# Patient Record
Sex: Male | Born: 1970 | Race: White | Hispanic: No | Smoking: Never smoker
Health system: Southern US, Community
[De-identification: ages and names within clinical notes are randomized; demographics above are authoritative.]

## PROBLEM LIST (undated history)

## (undated) DIAGNOSIS — I10 Essential (primary) hypertension: Secondary | ICD-10-CM

## (undated) DIAGNOSIS — E119 Type 2 diabetes mellitus without complications: Secondary | ICD-10-CM

## (undated) DIAGNOSIS — N200 Calculus of kidney: Secondary | ICD-10-CM

## (undated) DIAGNOSIS — E039 Hypothyroidism, unspecified: Secondary | ICD-10-CM

## (undated) DIAGNOSIS — T7840XA Allergy, unspecified, initial encounter: Secondary | ICD-10-CM

## (undated) DIAGNOSIS — G932 Benign intracranial hypertension: Secondary | ICD-10-CM

## (undated) DIAGNOSIS — G510 Bell's palsy: Secondary | ICD-10-CM

## (undated) DIAGNOSIS — I639 Cerebral infarction, unspecified: Secondary | ICD-10-CM

## (undated) DIAGNOSIS — D649 Anemia, unspecified: Secondary | ICD-10-CM

## (undated) HISTORY — PX: NO PAST SURGERIES: SHX2092

## (undated) HISTORY — DX: Essential (primary) hypertension: I10

## (undated) HISTORY — DX: Type 2 diabetes mellitus without complications: E11.9

## (undated) HISTORY — DX: Allergy, unspecified, initial encounter: T78.40XA

## (undated) HISTORY — DX: Hypothyroidism, unspecified: E03.9

## (undated) HISTORY — PX: LUMBAR PUNCTURE: SHX1985

## (undated) HISTORY — DX: Calculus of kidney: N20.0

## (undated) HISTORY — PX: REMOVAL OF A DIALYSIS CATHETER: SHX6053

---

## 2012-07-24 ENCOUNTER — Ambulatory Visit: Payer: Self-pay | Admitting: Internal Medicine

## 2012-08-12 ENCOUNTER — Telehealth: Payer: Self-pay | Admitting: Internal Medicine

## 2012-08-12 NOTE — Telephone Encounter (Signed)
Patient Information:  Caller Name: Gregory Crane  Phone: [phone]  Patient: Gregory Crane  Gender: Male  DOB: 03-20-1971  Age: 41 Years  PCP: [name]  Office Follow Up:  Does the office need to follow up with this patient?: Yes  Instructions For The Office: he triaged out for office today but call was like at 1630  He sounded stable enough for appt for Thursday but I didn't see any openings with Dr Nicki Reaper.  Could you see if he can be worked in to be seen on Thursday    I did instruct him to call back for any worsening of sxs  RN Note:  onset 2 days ago of vertigo,  He has had nausea associated with it and has had one episode of vomiting.  No pain and no breathing diffficulty   blood sugars have been ranging 160/220 which he says is not that much out of range for him.  Symptoms  Reason For Call & Symptoms: vertigo  Reviewed Health History In EMR: Yes  Reviewed Medications In EMR: Yes  Reviewed Allergies In EMR: Yes  Reviewed Surgeries / Procedures: Yes  Date of Onset of Symptoms: 08/10/2012  Guideline(s) Used:  Dizziness  Disposition Per Guideline:   Go to Office Now  Reason For Disposition Reached:   Lightheadedness (dizziness) present now, after 2 hours of rest and fluids  Advice Given:  Call Back If:  Passes out (faints)  You become worse.  RN Overrode Recommendation:  Follow Up With Office Later  needs to see if can be worked in [employer]

## 2012-08-12 NOTE — Telephone Encounter (Signed)
Called patient back, he said that he would go to acute and follow up with [place] afterwards.

## 2012-08-25 ENCOUNTER — Other Ambulatory Visit: Payer: Self-pay | Admitting: Internal Medicine

## 2012-08-25 NOTE — Telephone Encounter (Signed)
Pt is needing refills on 1000 mg Metformin and 75 mg Synthroid he uses [facility] on [address].

## 2012-08-27 MED ORDER — METFORMIN HCL 1000 MG PO TABS: 1000.0000 mg | ORAL_TABLET | Freq: Two times a day (BID) | ORAL | Status: DC

## 2012-08-27 MED ORDER — LEVOTHYROXINE SODIUM 75 MCG PO TABS: 75.0000 ug | ORAL_TABLET | Freq: Every day | ORAL | Status: DC

## 2012-08-27 NOTE — Telephone Encounter (Signed)
Called in to pharmacy. Patient notified.

## 2012-10-16 ENCOUNTER — Encounter: Payer: Self-pay | Admitting: Internal Medicine

## 2012-10-20 ENCOUNTER — Ambulatory Visit: Payer: Self-pay | Admitting: Internal Medicine

## 2012-12-09 NOTE — Telephone Encounter (Signed)
Please close encounter

## 2012-12-21 ENCOUNTER — Ambulatory Visit: Payer: Self-pay | Admitting: Internal Medicine

## 2013-02-04 ENCOUNTER — Encounter: Payer: Self-pay | Admitting: Internal Medicine

## 2013-05-12 ENCOUNTER — Telehealth: Payer: Self-pay | Admitting: *Deleted

## 2013-05-12 ENCOUNTER — Other Ambulatory Visit: Payer: Self-pay | Admitting: *Deleted

## 2013-05-12 MED ORDER — GLUCOSE BLOOD VI STRP: ORAL_STRIP | Status: DC

## 2013-05-12 NOTE — Telephone Encounter (Signed)
Pt called requesting test strips (freestyle lite)-needs sent to [facility] on [address]. I informed pt that he needs to have an appt on the schedule with Dr. Nicki Reaper prior to refilling anything. He has cancelled 3 new patient appts so far. Pt made appt for 11/16 (also advised that he must keep this appt). Notified pt that we will send it enough until his appt as long as Dr. Nicki Reaper agrees.

## 2013-05-12 NOTE — Telephone Encounter (Signed)
Sent Rx in electronically

## 2013-05-12 NOTE — Telephone Encounter (Signed)
rx written and placed in your basket.  Agree - has to keep appt for any more refills.

## 2013-07-01 ENCOUNTER — Ambulatory Visit (INDEPENDENT_AMBULATORY_CARE_PROVIDER_SITE_OTHER): Payer: 59 | Admitting: Internal Medicine

## 2013-07-01 ENCOUNTER — Encounter: Payer: Self-pay | Admitting: Internal Medicine

## 2013-07-01 VITALS — BP 140/90 | HR 93 | Temp 98.3°F | Ht 72.0 in | Wt 273.0 lb

## 2013-07-01 DIAGNOSIS — Z9109 Other allergy status, other than to drugs and biological substances: Secondary | ICD-10-CM

## 2013-07-01 DIAGNOSIS — E119 Type 2 diabetes mellitus without complications: Secondary | ICD-10-CM

## 2013-07-01 MED ORDER — GABAPENTIN 300 MG PO CAPS: 300.0000 mg | ORAL_CAPSULE | Freq: Every day | ORAL | Status: DC

## 2013-07-01 NOTE — Progress Notes (Signed)
Pre-visit discussion using our clinic review tool. No additional management support is needed unless otherwise documented below in the visit note.  

## 2013-07-01 NOTE — Progress Notes (Signed)
Subjective:    Patient ID: Gregory Crane, male    DOB: Jun 15, 1971, 42 y.o.   MRN: [id number]  HPI 42 year old male with past history of diabetes who comes in today to follow up on this as well as to transfer his care over the [place].  Former pt of mine at [facility].  He is not on any medications now for his diabetes.  States he was in a routine of exercising and watching what he eats.  States he lost weight and his sugars were under good control.  His average sugar was 112.  He reports he has now been out of his routine. Not checking his sugars.  Ran out of test strips. No chest pain or tightness.  No sob.  No acid reflux.  Does report hypersensitivity bilateral feet.  Occurs intermittently.  States it is localized to one spot - not his whole foot or toes.  Does keep him awake for a few nights when it occurs.  Described as a sharp/tingling sensation.  Does not radiate up his foot or leg.  Overall he feels he is doing well.     Past Medical History  Diagnosis Date  . Hypothyroidism   . Diabetes mellitus without complication   . Allergy   . Kidney stones      Outpatient Encounter Prescriptions as of 07/01/2013  Medication Sig  . glucose blood (FREESTYLE LITE) test strip Check blood sugars twice a day (Dx. 250.02)  . gabapentin (NEURONTIN) 300 MG capsule Take 1 capsule (300 mg total) by mouth daily.  . [DISCONTINUED] levothyroxine (SYNTHROID, LEVOTHROID) 75 MCG tablet Take 1 tablet (75 mcg total) by mouth daily.  . [DISCONTINUED] metFORMIN (GLUCOPHAGE) 1000 MG tablet Take 1 tablet (1,000 mg total) by mouth 2 (two) times daily.    Review of Systems Patient denies any headache, lightheadedness or dizziness.  No sinus or allergy symptoms.  No chest pain, tightness or palpitations.  No increased shortness of breath, cough or congestion.  No nausea or vomiting.  No abdominal pain or cramping.  No bowel change, such as diarrhea, constipation, BRBPR or melana.  No urine change.   Overall he feels he is  doing well.  Not exercising and not watching his diet. Plans to restart.  Has he foot sensation as outlined.       Objective:   Physical Exam Filed Vitals:   07/01/13 1329  BP: 140/90  Pulse: 93  Temp: 98.3 F (36.8 C)   Blood pressure recheck:  38/1  42 year old male in no acute distress.  HEENT:  Nares - clear.  Oropharynx - without lesions. NECK:  Supple.  Nontender.  No audible carotid bruit.  HEART:  Appears to be regular.   LUNGS:  No crackles or wheezing audible.  Respirations even and unlabored.   RADIAL PULSE:  Equal bilaterally.  ABDOMEN:  Soft.  Nontender.  Bowel sounds present and normal.  No audible abdominal bruit.   EXTREMITIES:  No increased edema present.  DP pulses palpable and equal bilaterally.  NEURO:  No decreased sensation to pin prick - bilateral feet.         Assessment & Plan:  NEURO.  Describes the hypersensitivity in his feet.  Check b12.  Discussed further w/up.  He wants to monitor for now.   HEALTH MAINTENANCE.  Schedule a physical for next visit.  Check psa.    I spent 25 minutes with the patient and more than 50% of the time was spent in  consultation regarding the above.

## 2013-07-04 ENCOUNTER — Encounter: Payer: Self-pay | Admitting: Internal Medicine

## 2013-07-04 DIAGNOSIS — Z9109 Other allergy status, other than to drugs and biological substances: Secondary | ICD-10-CM | POA: Insufficient documentation

## 2013-07-04 DIAGNOSIS — E119 Type 2 diabetes mellitus without complications: Secondary | ICD-10-CM | POA: Insufficient documentation

## 2013-07-04 NOTE — Assessment & Plan Note (Signed)
Has not been checking his sugars recently.  No test strips.  Not watching his diet.  Not exercising.  Plans to restart.  rx given for test strips.

## 2013-07-04 NOTE — Assessment & Plan Note (Signed)
Controlled.  

## 2013-07-21 ENCOUNTER — Other Ambulatory Visit: Payer: Self-pay | Admitting: *Deleted

## 2013-07-21 MED ORDER — GLUCOSE BLOOD VI STRP: ORAL_STRIP | Status: DC

## 2013-09-30 ENCOUNTER — Other Ambulatory Visit: Payer: Self-pay | Admitting: Internal Medicine

## 2013-10-01 LAB — CBC WITH DIFFERENTIAL
Basophils Absolute: 0 10*3/uL (ref 0.0–0.2)
Basos: 0 %
Eos: 1 %
Eosinophils Absolute: 0.1 10*3/uL (ref 0.0–0.4)
HCT: 48.4 % (ref 37.5–51.0)
Hemoglobin: 17 g/dL (ref 12.6–17.7)
Immature Grans (Abs): 0 10*3/uL (ref 0.0–0.1)
Immature Granulocytes: 0 %
Lymphocytes Absolute: 1.8 10*3/uL (ref 0.7–3.1)
Lymphs: 27 %
MCH: 30.7 pg (ref 26.6–33.0)
MCHC: 35.1 g/dL (ref 31.5–35.7)
MCV: 87 fL (ref 79–97)
Monocytes Absolute: 0.4 10*3/uL (ref 0.1–0.9)
Monocytes: 6 %
Neutrophils Absolute: 4.1 10*3/uL (ref 1.4–7.0)
Neutrophils Relative %: 66 %
Platelets: 259 10*3/uL (ref 150–379)
RBC: 5.54 x10E6/uL (ref 4.14–5.80)
RDW: 12.7 % (ref 12.3–15.4)
WBC: 6.4 10*3/uL (ref 3.4–10.8)

## 2013-10-01 LAB — COMPREHENSIVE METABOLIC PANEL
ALT: 18 IU/L (ref 0–44)
AST: 18 IU/L (ref 0–40)
Albumin/Globulin Ratio: 1.5 (ref 1.1–2.5)
Albumin: 4.6 g/dL (ref 3.5–5.5)
Alkaline Phosphatase: 85 IU/L (ref 39–117)
BUN/Creatinine Ratio: 16 (ref 9–20)
BUN: 17 mg/dL (ref 6–24)
CO2: 21 mmol/L (ref 18–29)
Calcium: 9.9 mg/dL (ref 8.7–10.2)
Chloride: 97 mmol/L (ref 97–108)
Creatinine, Ser: 1.08 mg/dL (ref 0.76–1.27)
GFR calc Af Amer: 97 mL/min/{1.73_m2} (ref >59)
GFR calc non Af Amer: 84 mL/min/{1.73_m2} (ref >59)
Globulin, Total: 3 g/dL (ref 1.5–4.5)
Glucose: 214 mg/dL — ABNORMAL HIGH (ref 65–99)
Potassium: 5.2 mmol/L (ref 3.5–5.2)
Sodium: 137 mmol/L (ref 134–144)
Total Bilirubin: 0.4 mg/dL (ref 0.0–1.2)
Total Protein: 7.6 g/dL (ref 6.0–8.5)

## 2013-10-01 LAB — MICROALBUMIN / CREATININE URINE RATIO
Creatinine, Ur: 194.7 mg/dL (ref 22.0–328.0)
MICROALB/CREAT RATIO: 67.4 mg/g creat — ABNORMAL HIGH (ref 0.0–30.0)
Microalbumin, Urine: 131.3 ug/mL — ABNORMAL HIGH (ref 0.0–17.0)

## 2013-10-01 LAB — LIPID PANEL W/O CHOL/HDL RATIO
Cholesterol, Total: 173 mg/dL (ref 100–199)
HDL: 25 mg/dL — ABNORMAL LOW (ref >39)
LDL Calculated: 99 mg/dL (ref 0–99)
Triglycerides: 247 mg/dL — ABNORMAL HIGH (ref 0–149)
VLDL Cholesterol Cal: 49 mg/dL — ABNORMAL HIGH (ref 5–40)

## 2013-10-01 LAB — TSH: TSH: 15.45 u[IU]/mL — ABNORMAL HIGH (ref 0.450–4.500)

## 2013-10-01 LAB — HGB A1C W/O EAG: Hgb A1c MFr Bld: 11.4 % — ABNORMAL HIGH (ref 4.8–5.6)

## 2013-10-01 LAB — VITAMIN B12: Vitamin B-12: 156 pg/mL — ABNORMAL LOW (ref 211–946)

## 2013-10-05 ENCOUNTER — Ambulatory Visit (INDEPENDENT_AMBULATORY_CARE_PROVIDER_SITE_OTHER): Payer: 59 | Admitting: Internal Medicine

## 2013-10-05 ENCOUNTER — Encounter: Payer: Self-pay | Admitting: Internal Medicine

## 2013-10-05 ENCOUNTER — Encounter (INDEPENDENT_AMBULATORY_CARE_PROVIDER_SITE_OTHER): Payer: Self-pay

## 2013-10-05 VITALS — BP 120/82 | HR 83 | Temp 97.9°F | Ht 72.5 in | Wt 268.5 lb

## 2013-10-05 DIAGNOSIS — E039 Hypothyroidism, unspecified: Secondary | ICD-10-CM

## 2013-10-05 DIAGNOSIS — E785 Hyperlipidemia, unspecified: Secondary | ICD-10-CM

## 2013-10-05 DIAGNOSIS — Z9109 Other allergy status, other than to drugs and biological substances: Secondary | ICD-10-CM

## 2013-10-05 DIAGNOSIS — R809 Proteinuria, unspecified: Secondary | ICD-10-CM

## 2013-10-05 DIAGNOSIS — E538 Deficiency of other specified B group vitamins: Secondary | ICD-10-CM

## 2013-10-05 DIAGNOSIS — R195 Other fecal abnormalities: Secondary | ICD-10-CM

## 2013-10-05 DIAGNOSIS — E119 Type 2 diabetes mellitus without complications: Secondary | ICD-10-CM

## 2013-10-05 MED ORDER — LEVOTHYROXINE SODIUM 50 MCG PO TABS: 50.0000 ug | ORAL_TABLET | Freq: Every day | ORAL | Status: DC

## 2013-10-05 MED ORDER — METFORMIN HCL 1000 MG PO TABS: 1000.0000 mg | ORAL_TABLET | Freq: Two times a day (BID) | ORAL | Status: DC

## 2013-10-05 MED ORDER — CYANOCOBALAMIN 1000 MCG/ML IJ SOLN
1000.0000 ug | Freq: Once | INTRAMUSCULAR | Status: AC
  Administered 2013-10-05: 1000 ug via INTRAMUSCULAR

## 2013-10-05 NOTE — Progress Notes (Signed)
Pre-visit discussion using our clinic review tool. No additional management support is needed unless otherwise documented below in the visit note.  

## 2013-10-10 ENCOUNTER — Encounter: Payer: Self-pay | Admitting: Internal Medicine

## 2013-10-10 DIAGNOSIS — E039 Hypothyroidism, unspecified: Secondary | ICD-10-CM | POA: Insufficient documentation

## 2013-10-10 DIAGNOSIS — E785 Hyperlipidemia, unspecified: Secondary | ICD-10-CM | POA: Insufficient documentation

## 2013-10-10 DIAGNOSIS — E538 Deficiency of other specified B group vitamins: Secondary | ICD-10-CM | POA: Insufficient documentation

## 2013-10-10 DIAGNOSIS — R809 Proteinuria, unspecified: Secondary | ICD-10-CM | POA: Insufficient documentation

## 2013-10-10 DIAGNOSIS — R195 Other fecal abnormalities: Secondary | ICD-10-CM | POA: Insufficient documentation

## 2013-10-10 NOTE — Assessment & Plan Note (Signed)
TSH elevated - 15.45.  Start synthroid 52mcg q day.  Check TSH in 4-6 weeks.

## 2013-10-10 NOTE — Assessment & Plan Note (Signed)
Triglycerides elevated - 247.  Low fat/low carb diet.  Will follow.  Diet and exercise.

## 2013-10-10 NOTE — Assessment & Plan Note (Signed)
Found on recent labs.  Will need to start ACE inhibitor.  Will follow.

## 2013-10-10 NOTE — Assessment & Plan Note (Signed)
Low B12.  Start 1060mcg q wk x 3 and then q month.

## 2013-10-10 NOTE — Assessment & Plan Note (Signed)
Heme positive on exam today.  Refer to GI for evaluation.  Hgb just checked 10/10/13 - 17.0.

## 2013-10-10 NOTE — Progress Notes (Signed)
  Subjective:    Patient ID: Gregory Crane, male    DOB: 1971/04/04, 43 y.o.   MRN: [id number]  HPI 43 year old male with past history of diabetes who comes in today to follow up on this as well as for a complete physical exam.  He is not on any medications now for his diabetes.  Has stopped all medications.  Had not been checking his sugars.  Just recently started.  AM sugars now 170.  He has just started back exercising and watching his diet.  previously had not been doing this.  A1c just checked - 11.4.    No chest pain or tightness.  No sob.  No acid reflux.   Overall he feels he is doing well.     Past Medical History  Diagnosis Date  . Hypothyroidism   . Diabetes mellitus without complication   . Allergy   . Kidney stones      Outpatient Encounter Prescriptions as of 10/05/2013  Medication Sig  . glucose blood (FREESTYLE LITE) test strip Check blood sugars twice a day (Dx. 250.02)  . levothyroxine (SYNTHROID, LEVOTHROID) 50 MCG tablet Take 1 tablet (50 mcg total) by mouth daily.  . metFORMIN (GLUCOPHAGE) 1000 MG tablet Take 1 tablet (1,000 mg total) by mouth 2 (two) times daily with a meal.  . [DISCONTINUED] gabapentin (NEURONTIN) 300 MG capsule Take 1 capsule (300 mg total) by mouth daily.  . [EXPIRED] cyanocobalamin ((VITAMIN B-12)) injection 1,000 mcg     Review of Systems Patient denies any headache, lightheadedness or dizziness.  No sinus or allergy symptoms.  No chest pain, tightness or palpitations.  No increased shortness of breath, cough or congestion.  No nausea or vomiting.  No abdominal pain or cramping.  No bowel change, such as diarrhea, constipation.   No urine change.   Overall he feels he is doing well.  Just started exercising and watching his diet.        Objective:   Physical Exam  Filed Vitals:   10/05/13 0940  BP: 120/82  Pulse: 83  Temp: 97.9 F (33.28 C)   43 year old male in no acute distress.  HEENT:  Nares - clear.  Oropharynx - without  lesions. NECK:  Supple.  Nontender.  No audible carotid bruit.  HEART:  Appears to be regular.   LUNGS:  No crackles or wheezing audible.  Respirations even and unlabored.   RADIAL PULSE:  Equal bilaterally.  ABDOMEN:  Soft.  Nontender.  Bowel sounds present and normal.  No audible abdominal bruit.  GU:  Normal descended testicles.  No palpable testicular nodules.   RECTAL:  Could not appreciate any palpable prostate nodules.  Heme positive.   EXTREMITIES:  No increased edema present.  DP pulses palpable and equal bilaterally.  FEET:  No lesions.         Assessment & Plan:  HEALTH MAINTENANCE:  Physical today.  Check psa next labs.  Refer to GI for evaluation of heme positive stool.     I spent 25 minutes with the patient and more than 50% of the time was spent in consultation regarding the above.

## 2013-10-10 NOTE — Assessment & Plan Note (Addendum)
Sugars poorly controlled.  A1c just checked 11.4.  Has just started back exercising and watching his diet.  AM sugars now averaging 170 - just recently.  Discussed the importance of checking sugars regularly and watching his diet and exercising. Is being followed at [facility] for his eye exams.  Sees them q 6 months.  Since he is adjusting his diet and now exercising, will start metformin 1000 bid.  Hold on additional medication.  Follow.

## 2013-10-10 NOTE — Assessment & Plan Note (Signed)
Controlled.  

## 2013-10-12 ENCOUNTER — Ambulatory Visit: Payer: 59

## 2013-10-14 ENCOUNTER — Ambulatory Visit (INDEPENDENT_AMBULATORY_CARE_PROVIDER_SITE_OTHER): Payer: 59 | Admitting: *Deleted

## 2013-10-14 DIAGNOSIS — E538 Deficiency of other specified B group vitamins: Secondary | ICD-10-CM

## 2013-10-14 MED ORDER — CYANOCOBALAMIN 1000 MCG/ML IJ SOLN
1000.0000 ug | Freq: Once | INTRAMUSCULAR | Status: AC
  Administered 2013-10-14: 1000 ug via INTRAMUSCULAR

## 2013-10-19 ENCOUNTER — Ambulatory Visit: Payer: 59

## 2013-10-20 ENCOUNTER — Ambulatory Visit (INDEPENDENT_AMBULATORY_CARE_PROVIDER_SITE_OTHER): Payer: 59 | Admitting: *Deleted

## 2013-10-20 DIAGNOSIS — E538 Deficiency of other specified B group vitamins: Secondary | ICD-10-CM

## 2013-10-20 MED ORDER — CYANOCOBALAMIN 1000 MCG/ML IJ SOLN
1000.0000 ug | Freq: Once | INTRAMUSCULAR | Status: AC
  Administered 2013-10-20: 1000 ug via INTRAMUSCULAR

## 2013-10-29 ENCOUNTER — Ambulatory Visit (INDEPENDENT_AMBULATORY_CARE_PROVIDER_SITE_OTHER): Payer: 59 | Admitting: Internal Medicine

## 2013-10-29 ENCOUNTER — Encounter: Payer: Self-pay | Admitting: Internal Medicine

## 2013-10-29 VITALS — BP 120/82 | HR 66 | Temp 97.7°F | Ht 72.5 in | Wt 270.5 lb

## 2013-10-29 DIAGNOSIS — E538 Deficiency of other specified B group vitamins: Secondary | ICD-10-CM

## 2013-10-29 DIAGNOSIS — R809 Proteinuria, unspecified: Secondary | ICD-10-CM

## 2013-10-29 DIAGNOSIS — E785 Hyperlipidemia, unspecified: Secondary | ICD-10-CM

## 2013-10-29 DIAGNOSIS — E119 Type 2 diabetes mellitus without complications: Secondary | ICD-10-CM

## 2013-10-29 MED ORDER — LISINOPRIL 10 MG PO TABS: 10.0000 mg | ORAL_TABLET | Freq: Every day | ORAL | Status: DC

## 2013-10-29 MED ORDER — METFORMIN HCL 1000 MG PO TABS: 1000.0000 mg | ORAL_TABLET | Freq: Two times a day (BID) | ORAL | Status: DC

## 2013-10-29 NOTE — Progress Notes (Signed)
Pre-visit discussion using our clinic review tool. No additional management support is needed unless otherwise documented below in the visit note.  

## 2013-10-31 ENCOUNTER — Encounter: Payer: Self-pay | Admitting: Internal Medicine

## 2013-10-31 NOTE — Assessment & Plan Note (Signed)
Sugars had been poorly controlled.  A1c just checked 11.4.  Has just started back exercising and watching his diet.  Sugars attached.   Much improved.  Discussed the importance of checking sugars regularly and watching his diet and exercising. Is being followed at [facility] for his eye exams.  Sees them q 6 months.  On metformin 1000 bid.  Start an ACE inhibitor for renal protection.

## 2013-10-31 NOTE — Progress Notes (Signed)
  Subjective:    Patient ID: Gregory Crane, male    DOB: July 10, 1971, 43 y.o.   MRN: [id number]  HPI 43 year old male with past history of diabetes who comes in today for a scheduled follow up.  His last a1c - 11.4.   He has adjusted his diet.  Is trying to exercise.  Taking metformin now.  Sugars improved.   Sugars attached.  No chest pain or tightness.  No sob.  No acid reflux.   Overall he feels he is doing well.  Feels better.     Past Medical History  Diagnosis Date  . Hypothyroidism   . Diabetes mellitus without complication   . Allergy   . Kidney stones      Outpatient Encounter Prescriptions as of 10/29/2013  Medication Sig  . glucose blood (FREESTYLE LITE) test strip Check blood sugars twice a day (Dx. 250.02)  . levothyroxine (SYNTHROID, LEVOTHROID) 50 MCG tablet Take 1 tablet (50 mcg total) by mouth daily.  . metFORMIN (GLUCOPHAGE) 1000 MG tablet Take 1 tablet (1,000 mg total) by mouth 2 (two) times daily with a meal.  . [DISCONTINUED] metFORMIN (GLUCOPHAGE) 1000 MG tablet Take 1 tablet (1,000 mg total) by mouth 2 (two) times daily with a meal.  . lisinopril (PRINIVIL,ZESTRIL) 10 MG tablet Take 1 tablet (10 mg total) by mouth daily.    Review of Systems Patient denies any headache, lightheadedness or dizziness.  No sinus or allergy symptoms.  No chest pain, tightness or palpitations.  No increased shortness of breath, cough or congestion.  No nausea or vomiting.  No abdominal pain or cramping.  No bowel change, such as diarrhea, constipation.   No urine change.   Overall he feels he is doing well.  Just started exercising and watching his diet.  Sugars improved.        Objective:   Physical Exam  Filed Vitals:   10/29/13 1004  BP: 120/82  Pulse: 66  Temp: 97.7 F (36.5 C)   Blood pressure recheck:  138-105/71-23  43 year old male in no acute distress.  HEENT:  Nares - clear.  Oropharynx - without lesions. NECK:  Supple.  Nontender.  No audible carotid bruit.  HEART:   Appears to be regular.   LUNGS:  No crackles or wheezing audible.  Respirations even and unlabored.   RADIAL PULSE:  Equal bilaterally.  ABDOMEN:  Soft.  Nontender.  Bowel sounds present and normal.  No audible abdominal bruit.   EXTREMITIES:  No increased edema present.  DP pulses palpable and equal bilaterally.  FEET:  No lesions.         Assessment & Plan:  HEALTH MAINTENANCE:  Physical 10/05/13.  Check psa next labs.  Referred to GI for evaluation of heme positive stool.     I spent 25 minutes with the patient and more than 50% of the time was spent in consultation regarding the above.

## 2013-10-31 NOTE — Assessment & Plan Note (Signed)
Receiving B12 injections.  °

## 2013-10-31 NOTE — Assessment & Plan Note (Signed)
Discussed with him today.  Start lisinopril $RemoveBeforeDE'10mg'[id number]$  q day.  Check met b 2 weeks after starting Lisinopril.

## 2013-10-31 NOTE — Assessment & Plan Note (Signed)
Triglycerides elevated - 247.  Low fat/low carb diet.  Will follow.  Diet and exercise.

## 2014-01-12 ENCOUNTER — Ambulatory Visit: Payer: 59 | Admitting: Internal Medicine

## 2014-01-26 ENCOUNTER — Emergency Department: Payer: Self-pay | Admitting: Emergency Medicine

## 2014-01-26 LAB — TROPONIN I: Troponin-I: <0.02 ng/mL

## 2014-01-26 LAB — CBC WITH DIFFERENTIAL/PLATELET
Basophil #: 0 10*3/uL (ref 0.0–0.1)
Basophil %: 0.3 %
Eosinophil #: 0.1 10*3/uL (ref 0.0–0.7)
Eosinophil %: 0.8 %
HCT: 49.6 % (ref 40.0–52.0)
HGB: 16.7 g/dL (ref 13.0–18.0)
Lymphocyte #: 2.1 10*3/uL (ref 1.0–3.6)
Lymphocyte %: 25.4 %
MCH: 30 pg (ref 26.0–34.0)
MCHC: 33.7 g/dL (ref 32.0–36.0)
MCV: 89 fL (ref 80–100)
Monocyte #: 0.5 x10 3/mm (ref 0.2–1.0)
Monocyte %: 5.9 %
Neutrophil #: 5.5 10*3/uL (ref 1.4–6.5)
Neutrophil %: 67.6 %
Platelet: 233 10*3/uL (ref 150–440)
RBC: 5.57 10*6/uL (ref 4.40–5.90)
RDW: 12.3 % (ref 11.5–14.5)
WBC: 8.2 10*3/uL (ref 3.8–10.6)

## 2014-01-26 LAB — BASIC METABOLIC PANEL
Anion Gap: 7 (ref 7–16)
BUN: 9 mg/dL (ref 7–18)
Calcium, Total: 9 mg/dL (ref 8.5–10.1)
Chloride: 102 mmol/L (ref 98–107)
Co2: 27 mmol/L (ref 21–32)
Creatinine: 0.76 mg/dL (ref 0.60–1.30)
EGFR (African American): >60
EGFR (Non-African Amer.): >60
Glucose: 217 mg/dL — ABNORMAL HIGH (ref 65–99)
Osmolality: 277 (ref 275–301)
Potassium: 4 mmol/L (ref 3.5–5.1)
Sodium: 136 mmol/L (ref 136–145)

## 2014-01-27 LAB — URINALYSIS, COMPLETE
Bacteria: NONE SEEN
Bilirubin,UR: NEGATIVE
Blood: NEGATIVE
Glucose,UR: >=500 mg/dL (ref 0–75)
Leukocyte Esterase: NEGATIVE
Nitrite: NEGATIVE
Ph: 5 (ref 4.5–8.0)
Protein: 30
RBC,UR: NONE SEEN /HPF (ref 0–5)
Specific Gravity: 1.033 (ref 1.003–1.030)
Squamous Epithelial: NONE SEEN
WBC UR: <1 /HPF (ref 0–5)

## 2014-01-28 ENCOUNTER — Ambulatory Visit (INDEPENDENT_AMBULATORY_CARE_PROVIDER_SITE_OTHER): Payer: 59 | Admitting: Adult Health

## 2014-01-28 ENCOUNTER — Telehealth: Payer: Self-pay | Admitting: Internal Medicine

## 2014-01-28 ENCOUNTER — Encounter: Payer: Self-pay | Admitting: Adult Health

## 2014-01-28 VITALS — BP 129/83 | HR 76 | Temp 97.6°F | Resp 14 | Wt 266.8 lb

## 2014-01-28 DIAGNOSIS — R42 Dizziness and giddiness: Secondary | ICD-10-CM

## 2014-01-28 MED ORDER — DIAZEPAM 5 MG PO TABS: ORAL_TABLET | ORAL | Status: DC

## 2014-01-28 MED ORDER — PROMETHAZINE HCL 25 MG PO TABS: 25.0000 mg | ORAL_TABLET | Freq: Three times a day (TID) | ORAL | Status: DC | PRN

## 2014-01-28 NOTE — Telephone Encounter (Signed)
Patient Information:  Caller Name: Kazi  Phone: [phone]  Patient: Gregory Crane, Gregory Crane  Gender: Male  DOB: Nov 28, 1970  Age: 43 Years  PCP: [name]  Office Follow Up:  Does the office need to follow up with this patient?: No  Instructions For The Office: N/A  RN Note:  Transferred pt. to the office for approval to see there. Will see Raquel Rey at 10:15 this morning for evaluation.  Symptoms  Reason For Call & Symptoms: Martin Majestic to the ED on 01/26/14 with vertigo. Had CT scan and bloodwork. All tests negative. Rx: Antivert. Now very nauseated and Antivert not helping his dizziness much. States passed one blood clot from the rectum 2 weeks ago.  Reviewed Health History In EMR: Yes  Reviewed Medications In EMR: Yes  Reviewed Allergies In EMR: Yes  Reviewed Surgeries / Procedures: Yes  Date of Onset of Symptoms: 01/26/2014  Treatments Tried: Antivert  Treatments Tried Worked: No  Guideline(s) Used:  Dizziness  Disposition Per Guideline:   Go to ED Now (or to Office with PCP Approval)  Reason For Disposition Reached:   Severe dizziness (e.g., unable to stand, requires support to walk, feels like passing out now)  Advice Given:  Call Back If:  Passes out (faints)  You become worse.  Patient Will Follow Care Advice:  YES  Appointment Scheduled:  01/28/2014 10:15:00 Appointment Scheduled Provider:  Charolette Forward

## 2014-01-28 NOTE — Patient Instructions (Signed)
  Do the exercises/maneuvres we discussed in clinic today.  Valium 1 tablet every 12 hours as needed for dizziness  Phenergan 1 tablet every 8 hours as needed for nausea. May cause sedation.  If no improvement by Monday please call.

## 2014-01-28 NOTE — Progress Notes (Signed)
Pre visit review using our clinic review tool, if applicable. No additional management support is needed unless otherwise documented below in the visit note. 

## 2014-01-28 NOTE — Telephone Encounter (Signed)
Appt today with Gregory Crane

## 2014-01-30 ENCOUNTER — Encounter: Payer: Self-pay | Admitting: Adult Health

## 2014-01-30 NOTE — Progress Notes (Signed)
   Subjective:    Patient ID: Gregory Crane, male    DOB: [birth year], 43 y.o.   MRN: [id number]  Dizziness Associated symptoms include nausea. Pertinent negatives include no chest pain, congestion or weakness.    Pt is a pleasant 43 y/o male who presents to clinic s/p visit to ED on 01/26/14 for dizziness. Worse when lying down, turning in bed. He was prescribed meclizine with some relief of symptoms. Experiencing nausea with the dizziness. Symptoms occurred suddenly. He denies chest pain, syncope or near syncope. No recent illness reported. No fever or chills.  Past Medical History  Diagnosis Date  . Hypothyroidism   . Diabetes mellitus without complication   . Allergy   . Kidney stones      Review of Systems  Constitutional: Negative.   HENT: Negative for congestion, ear pain, postnasal drip, rhinorrhea and sinus pressure.   Respiratory: Negative for chest tightness and shortness of breath.   Cardiovascular: Negative for chest pain and palpitations.  Gastrointestinal: Positive for nausea.  Neurological: Positive for dizziness. Negative for tremors, syncope and weakness.  Psychiatric/Behavioral: Negative.   All other systems reviewed and are negative.      Objective:   Physical Exam  Constitutional: He is oriented to person, place, and time. No distress.  HENT:  Head: Normocephalic and atraumatic.  Right Ear: External ear normal.  Left Ear: External ear normal.  Eyes: Conjunctivae and EOM are normal.  Cardiovascular: Normal rate and regular rhythm.   Pulmonary/Chest: Effort normal. No respiratory distress.  Musculoskeletal: Normal range of motion.  Lymphadenopathy:    He has no cervical adenopathy.  Neurological: He is alert and oriented to person, place, and time.  Skin: Skin is warm and dry.  Psychiatric: He has a normal mood and affect. His behavior is normal. Judgment and thought content normal.       Assessment & Plan:   1. Vertigo Suspect BPPV. Discussed  maneuvers and exercises that he can do to see if this will alleviate his symptoms. I will give a prescription for phenergan to help with nausea. Advised that sedation can occur. Will also give short course of valium to see if this help with symptoms as well. If no improvement within 1 week will send to physical therapy.

## 2014-03-24 ENCOUNTER — Telehealth: Payer: Self-pay | Admitting: *Deleted

## 2014-03-24 NOTE — Telephone Encounter (Signed)
Chart reviewed for DM bundle. Pt due for followup with Dr. Nicki Reaper. Left message, notifying pt and to call office to schedule.

## 2014-10-11 LAB — HM DIABETES EYE EXAM

## 2014-10-12 ENCOUNTER — Encounter: Payer: Self-pay | Admitting: Internal Medicine

## 2015-06-29 ENCOUNTER — Encounter: Payer: Self-pay | Admitting: Internal Medicine

## 2015-08-03 ENCOUNTER — Encounter: Payer: Self-pay | Admitting: Internal Medicine

## 2015-08-03 ENCOUNTER — Ambulatory Visit (INDEPENDENT_AMBULATORY_CARE_PROVIDER_SITE_OTHER): Payer: 59 | Admitting: Internal Medicine

## 2015-08-03 VITALS — BP 140/98 | HR 83 | Temp 97.7°F | Resp 18 | Ht 71.25 in | Wt 250.5 lb

## 2015-08-03 DIAGNOSIS — E039 Hypothyroidism, unspecified: Secondary | ICD-10-CM

## 2015-08-03 DIAGNOSIS — I10 Essential (primary) hypertension: Secondary | ICD-10-CM

## 2015-08-03 DIAGNOSIS — E538 Deficiency of other specified B group vitamins: Secondary | ICD-10-CM

## 2015-08-03 DIAGNOSIS — E11319 Type 2 diabetes mellitus with unspecified diabetic retinopathy without macular edema: Secondary | ICD-10-CM | POA: Diagnosis not present

## 2015-08-03 DIAGNOSIS — E785 Hyperlipidemia, unspecified: Secondary | ICD-10-CM

## 2015-08-03 DIAGNOSIS — Z0000 Encounter for general adult medical examination without abnormal findings: Secondary | ICD-10-CM

## 2015-08-03 DIAGNOSIS — R809 Proteinuria, unspecified: Secondary | ICD-10-CM

## 2015-08-03 DIAGNOSIS — G629 Polyneuropathy, unspecified: Secondary | ICD-10-CM

## 2015-08-03 MED ORDER — LISINOPRIL 10 MG PO TABS: 10.0000 mg | ORAL_TABLET | Freq: Every day | ORAL | Status: DC

## 2015-08-03 MED ORDER — GABAPENTIN 100 MG PO CAPS: 100.0000 mg | ORAL_CAPSULE | Freq: Three times a day (TID) | ORAL | Status: DC

## 2015-08-03 NOTE — Progress Notes (Signed)
Patient ID: Gregory Crane, male   DOB: 04/14/1971, 44 y.o.   MRN: [id number]   Subjective:    Patient ID: Gregory Crane, male    DOB: 06/11/1971, 44 y.o.   MRN: [id number]  HPI  Patient with past history of diabetes, allergies, hypothyroidism and hypercholesterolemia.  He comes in today to follow up on these issues as well as for a complete physical exam.  I have not seen him in two years.  He is not taking any of his medications.  Brought in no recorded sugar readings.  States that his sugars are varying.  Highest reading 300.  Lowest reading 110.  Averaging 180.  Discussed diet and exercise.  He plans to start exercising more.  No chest pain or tightness.  No sob.  No abdominal pain or cramping.  Bowels stable.  He does report increased pain in his feet/toes.  Also report nunbness and tingling.  Needs something to help control the discomfort.  He has lost some weight.  Declines lifestyles referral.  States he knows what he is supposed to do.     Past Medical History  Diagnosis Date  . Hypothyroidism   . Diabetes mellitus without complication (HCC)   . Allergy   . Kidney stones    Past Surgical History  Procedure Laterality Date  . No past surgeries     Family History  Problem Relation Age of Onset  . Breast cancer Mother   . Diabetes Father   . Diabetes Sister   . Arthritis Maternal Grandmother   . Diabetes Maternal Grandmother    Social History   Social History  . Marital Status: Married    Spouse Name: N/A  . Number of Children: N/A  . Years of Education: N/A   Social History Main Topics  . Smoking status: Never Smoker   . Smokeless tobacco: Never Used  . Alcohol Use: 0.0 oz/week    0 Standard drinks or equivalent per week     Comment: very rarely (1-2/year)  . Drug Use: No  . Sexual Activity: Not Asked   Other Topics Concern  . None   Social History Narrative    Outpatient Encounter Prescriptions as of 08/03/2015  Medication Sig  . glucose blood (FREESTYLE  LITE) test strip Check blood sugars twice a day (Dx. 250.02)  . diazepam (VALIUM) 5 MG tablet Take 1 tablet every 12 hours as needed for vertigo (Patient not taking: Reported on 08/03/2015)  . gabapentin (NEURONTIN) 100 MG capsule Take 1 capsule (100 mg total) by mouth 3 (three) times daily.  . levothyroxine (SYNTHROID, LEVOTHROID) 50 MCG tablet Take 1 tablet (50 mcg total) by mouth daily. (Patient not taking: Reported on 08/03/2015)  . lisinopril (PRINIVIL,ZESTRIL) 10 MG tablet Take 1 tablet (10 mg total) by mouth daily.  . metFORMIN (GLUCOPHAGE) 1000 MG tablet Take 1 tablet (1,000 mg total) by mouth 2 (two) times daily with a meal. (Patient not taking: Reported on 08/03/2015)  . promethazine (PHENERGAN) 25 MG tablet Take 1 tablet (25 mg total) by mouth every 8 (eight) hours as needed for nausea or vomiting. (Patient not taking: Reported on 08/03/2015)  . [DISCONTINUED] lisinopril (PRINIVIL,ZESTRIL) 10 MG tablet Take 1 tablet (10 mg total) by mouth daily. (Patient not taking: Reported on 08/03/2015)   No facility-administered encounter medications on file as of 08/03/2015.    Review of Systems  Constitutional:       Discussed eating regular meals.  Has adjusted diet some.  Has lost some   weight.    HENT: Negative for congestion and sinus pressure.   Eyes: Negative for pain and discharge.  Respiratory: Negative for cough, chest tightness and shortness of breath.   Cardiovascular: Negative for chest pain, palpitations and leg swelling.  Gastrointestinal: Negative for nausea, vomiting, abdominal pain and diarrhea.  Genitourinary: Negative for dysuria and difficulty urinating.  Musculoskeletal: Negative for back pain and joint swelling.  Skin: Negative for color change and rash.  Neurological: Negative for dizziness, light-headedness and headaches.  Hematological: Negative for adenopathy. Does not bruise/bleed easily.  Psychiatric/Behavioral: Negative for dysphoric mood and agitation.         Objective:    Physical Exam  Constitutional: He is oriented to person, place, and time. He appears well-developed and well-nourished. No distress.  HENT:  Head: Normocephalic and atraumatic.  Nose: Nose normal.  Mouth/Throat: Oropharynx is clear and moist. No oropharyngeal exudate.  Eyes: Conjunctivae are normal. Right eye exhibits no discharge. Left eye exhibits no discharge.  Neck: Neck supple. No thyromegaly present.  Cardiovascular: Normal rate and regular rhythm.   Pulmonary/Chest: Breath sounds normal. No respiratory distress. He has no wheezes.  Abdominal: Soft. Bowel sounds are normal. There is no tenderness.  Genitourinary:  Pt declined.   Musculoskeletal: He exhibits no edema or tenderness.  Lymphadenopathy:    He has no cervical adenopathy.  Neurological: He is alert and oriented to person, place, and time.  Sensation intact to pin prick.  Describes more hypersensitive.    Skin: Skin is warm and dry. No rash noted. No erythema.  Psychiatric: He has a normal mood and affect. His behavior is normal.    BP 140/98 mmHg  Pulse 83  Temp(Src) 97.7 F (36.5 C) (Oral)  Resp 18  Ht 5' 11.25" (1.81 m)  Wt 250 lb 8 oz (113.626 kg)  BMI 34.68 kg/m2  SpO2 96% Wt Readings from Last 3 Encounters:  08/03/15 250 lb 8 oz (113.626 kg)  01/28/14 266 lb 12 oz (120.997 kg)  10/29/13 270 lb 8 oz (122.698 kg)     Lab Results  Component Value Date   WBC 8.2 01/26/2014   HGB 16.7 01/26/2014   HCT 49.6 01/26/2014   PLT 233 01/26/2014   GLUCOSE 217* 01/26/2014   CHOL 173 09/30/2013   TRIG 247* 09/30/2013   HDL 25* 09/30/2013   LDLCALC 99 09/30/2013   ALT 18 09/30/2013   AST 18 09/30/2013   NA 136 01/26/2014   K 4.0 01/26/2014   CL 102 01/26/2014   CREATININE 0.76 01/26/2014   BUN 9 01/26/2014   CO2 27 01/26/2014   TSH 15.450* 09/30/2013   HGBA1C 11.4* 09/30/2013       Assessment & Plan:   Problem List Items Addressed This Visit    B12 deficiency - Primary    Was  receiving B12 injections.  Need to confirm if still receiving.        Diabetes (HCC)    Not taking any medication.  Has lost some weight.  Discussed diet and exercise.  Follow met b and a1c.  Will need to start checking and recording his sugars.  He declines to restart metformin today.  Followed by retina specialists for his eye exams.  Restart lisinopril.        Relevant Medications   lisinopril (PRINIVIL,ZESTRIL) 10 MG tablet   Essential hypertension    Elevated blood pressure.  Restart lisinopril 10mg q day.  Follow.        Relevant Medications   lisinopril (  PRINIVIL,ZESTRIL) 10 MG tablet   Health care maintenance    Physical today 08/03/15.  Declined prostate check.  Check cholesterol.        Hyperlipidemia    Low cholesterol diet and exercise.  Follow lipid panel.        Relevant Medications   lisinopril (PRINIVIL,ZESTRIL) 10 MG tablet   Hypothyroidism    Not taking thyroid replacement.  Recheck tsh to see if needs to restart.        Microalbuminuria    Restart lisinopril.        Neuropathy ([place])    Exam as outlined.  Hypersensitivity.  Start gabapentin 112m tid.  Follow.  Titrate up as needed.            SEinar Pheasant MD

## 2015-08-03 NOTE — Progress Notes (Signed)
Pre-visit discussion using our clinic review tool. No additional management support is needed unless otherwise documented below in the visit note.  

## 2015-08-06 ENCOUNTER — Encounter: Payer: Self-pay | Admitting: Internal Medicine

## 2015-08-06 DIAGNOSIS — G629 Polyneuropathy, unspecified: Secondary | ICD-10-CM | POA: Insufficient documentation

## 2015-08-06 DIAGNOSIS — Z0000 Encounter for general adult medical examination without abnormal findings: Secondary | ICD-10-CM | POA: Insufficient documentation

## 2015-08-06 DIAGNOSIS — I10 Essential (primary) hypertension: Secondary | ICD-10-CM | POA: Insufficient documentation

## 2015-08-06 NOTE — Assessment & Plan Note (Signed)
Not taking thyroid replacement.  Recheck tsh to see if needs to restart.

## 2015-08-06 NOTE — Assessment & Plan Note (Signed)
Physical today 08/03/15.  Declined prostate check.  Check cholesterol.

## 2015-08-06 NOTE — Assessment & Plan Note (Signed)
Was receiving B12 injections.  Need to confirm if still receiving.

## 2015-08-06 NOTE — Assessment & Plan Note (Signed)
Low cholesterol diet and exercise.  Follow lipid panel.   

## 2015-08-06 NOTE — Assessment & Plan Note (Signed)
Not taking any medication.  Has lost some weight.  Discussed diet and exercise.  Follow met b and a1c.  Will need to start checking and recording his sugars.  He declines to restart metformin today.  Followed by retina specialists for his eye exams.  Restart lisinopril.

## 2015-08-06 NOTE — Assessment & Plan Note (Signed)
Restart lisinopril 

## 2015-08-06 NOTE — Assessment & Plan Note (Signed)
Exam as outlined.  Hypersensitivity.  Start gabapentin 100mg  tid.  Follow.  Titrate up as needed.

## 2015-08-06 NOTE — Assessment & Plan Note (Signed)
Elevated blood pressure.  Restart lisinopril 10mg  q day.  Follow.

## 2015-08-11 ENCOUNTER — Other Ambulatory Visit: Payer: Self-pay | Admitting: Internal Medicine

## 2015-08-12 LAB — CBC WITH DIFFERENTIAL/PLATELET
Basophils Absolute: 0 10*3/uL (ref 0.0–0.2)
Basos: 1 %
EOS (ABSOLUTE): 0.1 10*3/uL (ref 0.0–0.4)
Eos: 1 %
Hematocrit: 48.3 % (ref 37.5–51.0)
Hemoglobin: 16.9 g/dL (ref 12.6–17.7)
Immature Grans (Abs): 0 10*3/uL (ref 0.0–0.1)
Immature Granulocytes: 0 %
Lymphocytes Absolute: 2 10*3/uL (ref 0.7–3.1)
Lymphs: 31 %
MCH: 29.8 pg (ref 26.6–33.0)
MCHC: 35 g/dL (ref 31.5–35.7)
MCV: 85 fL (ref 79–97)
Monocytes Absolute: 0.5 10*3/uL (ref 0.1–0.9)
Monocytes: 7 %
Neutrophils Absolute: 3.9 10*3/uL (ref 1.4–7.0)
Neutrophils: 60 %
Platelets: 248 10*3/uL (ref 150–379)
RBC: 5.67 x10E6/uL (ref 4.14–5.80)
RDW: 11.9 % — ABNORMAL LOW (ref 12.3–15.4)
WBC: 6.6 10*3/uL (ref 3.4–10.8)

## 2015-08-12 LAB — BASIC METABOLIC PANEL
BUN/Creatinine Ratio: 16 (ref 9–20)
BUN: 14 mg/dL (ref 6–24)
CO2: 27 mmol/L (ref 18–29)
Calcium: 9.8 mg/dL (ref 8.7–10.2)
Chloride: 97 mmol/L (ref 96–106)
Creatinine, Ser: 0.87 mg/dL (ref 0.76–1.27)
GFR calc Af Amer: 120 mL/min/{1.73_m2} (ref >59)
GFR calc non Af Amer: 104 mL/min/{1.73_m2} (ref >59)
Glucose: 212 mg/dL — ABNORMAL HIGH (ref 65–99)
Potassium: 5.2 mmol/L (ref 3.5–5.2)
Sodium: 138 mmol/L (ref 134–144)

## 2015-08-12 LAB — LIPID PANEL W/O CHOL/HDL RATIO
Cholesterol, Total: 166 mg/dL (ref 100–199)
HDL: 33 mg/dL — ABNORMAL LOW (ref >39)
LDL Calculated: 95 mg/dL (ref 0–99)
Triglycerides: 191 mg/dL — ABNORMAL HIGH (ref 0–149)
VLDL Cholesterol Cal: 38 mg/dL (ref 5–40)

## 2015-08-12 LAB — HEPATIC FUNCTION PANEL
ALT: 14 IU/L (ref 0–44)
AST: 12 IU/L (ref 0–40)
Albumin: 4.4 g/dL (ref 3.5–5.5)
Alkaline Phosphatase: 73 IU/L (ref 39–117)
Bilirubin Total: 0.5 mg/dL (ref 0.0–1.2)
Bilirubin, Direct: 0.13 mg/dL (ref 0.00–0.40)
Total Protein: 7.7 g/dL (ref 6.0–8.5)

## 2015-08-12 LAB — TSH: TSH: 12.41 u[IU]/mL — ABNORMAL HIGH (ref 0.450–4.500)

## 2015-08-12 LAB — MICROALBUMIN / CREATININE URINE RATIO
Creatinine, Urine: 74.5 mg/dL
MICROALB/CREAT RATIO: 258 mg/g creat — ABNORMAL HIGH (ref 0.0–30.0)
Microalbumin, Urine: 192.2 ug/mL

## 2015-08-12 LAB — HGB A1C W/O EAG: Hgb A1c MFr Bld: 10.6 % — ABNORMAL HIGH (ref 4.8–5.6)

## 2015-08-16 ENCOUNTER — Encounter: Payer: Self-pay | Admitting: *Deleted

## 2015-09-11 ENCOUNTER — Other Ambulatory Visit: Payer: Self-pay

## 2015-09-11 ENCOUNTER — Other Ambulatory Visit: Payer: Self-pay | Admitting: Internal Medicine

## 2015-09-11 ENCOUNTER — Telehealth: Payer: Self-pay | Admitting: Internal Medicine

## 2015-09-11 MED ORDER — GABAPENTIN 100 MG PO CAPS: 100.0000 mg | ORAL_CAPSULE | Freq: Three times a day (TID) | ORAL | Status: DC

## 2015-09-11 NOTE — Telephone Encounter (Signed)
Pt wife called about needing a refill for gabapentin (NEURONTIN) 100 MG capsule, lisinopril (PRINIVIL,ZESTRIL) 10 MG tablet. Pharmacy [place] [place]. Call pt @ [phone]. Thank You!

## 2015-09-11 NOTE — Telephone Encounter (Signed)
Please refer to last lab note.  If pt has not been taking his synthroid (as instructed with last lab result, he does need to start.  Will need to change his lab appt scheduled for 09/27/15 - if he is just now starting.  Will need to start synthroid 64mcg q day and recheck tsh in 6 weeks.  Also ok to refill lisinopril.  Need to know where wants rx sent.

## 2015-09-11 NOTE — Telephone Encounter (Signed)
Pt. Called in and wanted refills on lisinopril and synthroid. According to CPE done on 08/03/15 the pt med list says not taking. Please advise./tvw

## 2015-09-11 NOTE — Telephone Encounter (Signed)
Se below  The Third medication is levothyroxine (SYNTHROID, LEVOTHROID) 50 MCG tablet. Thank You!

## 2015-09-15 ENCOUNTER — Telehealth: Payer: Self-pay | Admitting: Internal Medicine

## 2015-09-15 NOTE — Telephone Encounter (Signed)
Pt is returning [employer]. Please let her know.

## 2015-09-15 NOTE — Telephone Encounter (Signed)
[email or web address]

## 2015-09-25 ENCOUNTER — Telehealth: Payer: Self-pay | Admitting: *Deleted

## 2015-09-25 NOTE — Telephone Encounter (Signed)
Pt is requesting a lab slip for labs to be drawn at [facility]

## 2015-09-25 NOTE — Telephone Encounter (Signed)
Form completed and placed on your desk

## 2015-09-25 NOTE — Telephone Encounter (Signed)
Called pt for p/u

## 2015-09-25 NOTE — Telephone Encounter (Signed)
Patient requested a lab slip for his labs to be drawn at [facility] Please all patient when ready

## 2015-09-27 ENCOUNTER — Other Ambulatory Visit: Payer: 59

## 2015-09-27 ENCOUNTER — Other Ambulatory Visit: Payer: Self-pay | Admitting: Internal Medicine

## 2015-09-28 ENCOUNTER — Encounter: Payer: Self-pay | Admitting: Internal Medicine

## 2015-09-28 ENCOUNTER — Ambulatory Visit (INDEPENDENT_AMBULATORY_CARE_PROVIDER_SITE_OTHER): Payer: 59 | Admitting: Internal Medicine

## 2015-09-28 VITALS — BP 136/80 | HR 84 | Temp 97.7°F | Resp 18 | Ht 71.25 in | Wt 253.5 lb

## 2015-09-28 DIAGNOSIS — G629 Polyneuropathy, unspecified: Secondary | ICD-10-CM | POA: Diagnosis not present

## 2015-09-28 DIAGNOSIS — R809 Proteinuria, unspecified: Secondary | ICD-10-CM

## 2015-09-28 DIAGNOSIS — E039 Hypothyroidism, unspecified: Secondary | ICD-10-CM | POA: Diagnosis not present

## 2015-09-28 DIAGNOSIS — E1142 Type 2 diabetes mellitus with diabetic polyneuropathy: Secondary | ICD-10-CM | POA: Diagnosis not present

## 2015-09-28 DIAGNOSIS — E785 Hyperlipidemia, unspecified: Secondary | ICD-10-CM

## 2015-09-28 DIAGNOSIS — I10 Essential (primary) hypertension: Secondary | ICD-10-CM | POA: Diagnosis not present

## 2015-09-28 DIAGNOSIS — N529 Male erectile dysfunction, unspecified: Secondary | ICD-10-CM

## 2015-09-28 LAB — BASIC METABOLIC PANEL
BUN/Creatinine Ratio: 15 (ref 9–20)
BUN: 14 mg/dL (ref 6–24)
CO2: 25 mmol/L (ref 18–29)
Calcium: 9.8 mg/dL (ref 8.7–10.2)
Chloride: 97 mmol/L (ref 96–106)
Creatinine, Ser: 0.93 mg/dL (ref 0.76–1.27)
GFR calc Af Amer: 114 mL/min/{1.73_m2} (ref >59)
GFR calc non Af Amer: 99 mL/min/{1.73_m2} (ref >59)
Glucose: 239 mg/dL — ABNORMAL HIGH (ref 65–99)
Potassium: 5.4 mmol/L — ABNORMAL HIGH (ref 3.5–5.2)
Sodium: 137 mmol/L (ref 134–144)

## 2015-09-28 LAB — TSH: TSH: 10.89 u[IU]/mL — ABNORMAL HIGH (ref 0.450–4.500)

## 2015-09-28 MED ORDER — LISINOPRIL 20 MG PO TABS: 20.0000 mg | ORAL_TABLET | Freq: Every day | ORAL | Status: DC

## 2015-09-28 MED ORDER — TADALAFIL 10 MG PO TABS: ORAL_TABLET | ORAL | Status: DC

## 2015-09-28 MED ORDER — LEVOTHYROXINE SODIUM 50 MCG PO TABS: 50.0000 ug | ORAL_TABLET | Freq: Every day | ORAL | Status: DC

## 2015-09-28 MED ORDER — METFORMIN HCL 1000 MG PO TABS: 1000.0000 mg | ORAL_TABLET | Freq: Two times a day (BID) | ORAL | Status: DC

## 2015-09-28 NOTE — Progress Notes (Signed)
Pre-visit discussion using our clinic review tool. No additional management support is needed unless otherwise documented below in the visit note.  

## 2015-09-28 NOTE — Telephone Encounter (Signed)
Pt was seen in office to & medications were taken care of.

## 2015-09-28 NOTE — Progress Notes (Signed)
Patient ID: Gregory Crane, male   DOB: 06/07/1971, 45 y.o.   MRN: [id number]   Subjective:    Patient ID: Gregory Crane, male    DOB: [birth year], 45 y.o.   MRN: [id number]  HPI  Patient with past history of hypothyroidism, hypertension and diabetes.  He comes in today to follow up on these issues.  Recently found to have elevated tsh.  Has not started synthroid.  Has not started metformin.  Has been hesitant to start any diabetic medication.  Discussed with him today.  Discussed that given his sugar levels, he needs treatment.  Discussed the need for more than just metformin.  He agreed to metformin only.  Is taking lisinopril. Also taking gabapentin.  Helping his legs some.  Feels she needs more.  Discussed diet and exercise.  States his wife's daughter just gave birth.  Baby born premature.  Has been stressed.  Has not checked sugars.  Has not been eating correctly.  Plans to start.  Both baby and mother are home.  Plans to start exercising.  Has decreased sodas and tea.  No chest pain.  No chest tightness.  Breathing stable.  No abdominal pain or cramping.  Bowels stable.    Past Medical History  Diagnosis Date  . Hypothyroidism   . Diabetes mellitus without complication ([place])   . Allergy   . Kidney stones    Past Surgical History  Procedure Laterality Date  . No past surgeries     Family History  Problem Relation Age of Onset  . Breast cancer Mother   . Diabetes Father   . Diabetes Sister   . Arthritis Maternal Grandmother   . Diabetes Maternal Grandmother    Social History   Social History  . Marital Status: Married    Spouse Name: N/A  . Number of Children: N/A  . Years of Education: N/A   Social History Main Topics  . Smoking status: Never Smoker   . Smokeless tobacco: Never Used  . Alcohol Use: 0.0 oz/week    0 Standard drinks or equivalent per week     Comment: very rarely (1-2/year)  . Drug Use: No  . Sexual Activity: Not Asked   Other Topics Concern  . None    Social History Narrative    Outpatient Encounter Prescriptions as of 09/28/2015  Medication Sig  . gabapentin (NEURONTIN) 100 MG capsule Take 1 capsule (100 mg total) by mouth 3 (three) times daily.  Marland Kitchen glucose blood (FREESTYLE LITE) test strip Check blood sugars twice a day (Dx. 250.02)  . [DISCONTINUED] lisinopril (PRINIVIL,ZESTRIL) 10 MG tablet Take 1 tablet (10 mg total) by mouth daily.  Marland Kitchen levothyroxine (SYNTHROID, LEVOTHROID) 50 MCG tablet Take 1 tablet (50 mcg total) by mouth daily.  Marland Kitchen lisinopril (PRINIVIL,ZESTRIL) 20 MG tablet Take 1 tablet (20 mg total) by mouth daily.  . metFORMIN (GLUCOPHAGE) 1000 MG tablet Take 1 tablet (1,000 mg total) by mouth 2 (two) times daily with a meal.  . tadalafil (CIALIS) 10 MG tablet Take as directed.  . [DISCONTINUED] diazepam (VALIUM) 5 MG tablet Take 1 tablet every 12 hours as needed for vertigo (Patient not taking: Reported on 08/03/2015)  . [DISCONTINUED] levothyroxine (SYNTHROID, LEVOTHROID) 50 MCG tablet Take 1 tablet (50 mcg total) by mouth daily. (Patient not taking: Reported on 08/03/2015)  . [DISCONTINUED] metFORMIN (GLUCOPHAGE) 1000 MG tablet Take 1 tablet (1,000 mg total) by mouth 2 (two) times daily with a meal. (Patient not taking: Reported on 08/03/2015)  . [  DISCONTINUED] promethazine (PHENERGAN) 25 MG tablet Take 1 tablet (25 mg total) by mouth every 8 (eight) hours as needed for nausea or vomiting. (Patient not taking: Reported on 08/03/2015)   No facility-administered encounter medications on file as of 09/28/2015.    Review of Systems  Constitutional: Negative for appetite change and unexpected weight change.  HENT: Negative for congestion and sinus pressure.   Respiratory: Negative for cough, chest tightness and shortness of breath.   Cardiovascular: Negative for chest pain, palpitations and leg swelling.  Gastrointestinal: Negative for nausea, vomiting, abdominal pain and diarrhea.  Genitourinary: Negative for dysuria and difficulty  urinating.       Reports problems with obtaining and sustaining an erection.  Would like to start medication.    Musculoskeletal: Negative for back pain and joint swelling.  Skin: Negative for color change and rash.  Neurological: Negative for dizziness, light-headedness and headaches.  Psychiatric/Behavioral: Negative for dysphoric mood and agitation.       Objective:    Physical Exam  Constitutional: He appears well-developed and well-nourished. No distress.  HENT:  Nose: Nose normal.  Mouth/Throat: Oropharynx is clear and moist.  Eyes: Conjunctivae are normal. Right eye exhibits no discharge. Left eye exhibits no discharge.  Neck: Neck supple. No thyromegaly present.  Cardiovascular: Normal rate and regular rhythm.   Pulmonary/Chest: Effort normal and breath sounds normal. No respiratory distress.  Abdominal: Soft. Bowel sounds are normal. There is no tenderness.  Musculoskeletal: He exhibits no edema or tenderness.  Lymphadenopathy:    He has no cervical adenopathy.  Skin: No rash noted. No erythema.  Psychiatric: He has a normal mood and affect. His behavior is normal.    BP 136/80 mmHg  Pulse 84  Temp(Src) 97.7 F (36.5 C) (Oral)  Resp 18  Ht 5' 11.25" (1.81 m)  Wt 253 lb 8 oz (114.987 kg)  BMI 35.10 kg/m2  SpO2 95% Wt Readings from Last 3 Encounters:  09/28/15 253 lb 8 oz (114.987 kg)  08/03/15 250 lb 8 oz (113.626 kg)  01/28/14 266 lb 12 oz (120.997 kg)     Lab Results  Component Value Date   WBC 6.6 08/11/2015   HGB 16.7 01/26/2014   HCT 48.3 08/11/2015   PLT 248 08/11/2015   GLUCOSE 239* 09/27/2015   CHOL 166 08/11/2015   TRIG 191* 08/11/2015   HDL 33* 08/11/2015   LDLCALC 95 08/11/2015   ALT 14 08/11/2015   AST 12 08/11/2015   NA 137 09/27/2015   K 5.4* 09/27/2015   CL 97 09/27/2015   CREATININE 0.93 09/27/2015   BUN 14 09/27/2015   CO2 25 09/27/2015   TSH 10.890* 09/27/2015   HGBA1C 10.6* 08/11/2015       Assessment & Plan:   Problem  List Items Addressed This Visit    Diabetes ([place]) - Primary    Sugars poorly controlled.  Discussed with him today.  He has declined to start medication previously.  Discussed with him today - as outlined.  Start metformin bid.  Follow sugars.  Get him back in soon to reassess.  Needs eye exam.        Relevant Medications   metFORMIN (GLUCOPHAGE) 1000 MG tablet   lisinopril (PRINIVIL,ZESTRIL) 20 MG tablet   Other Relevant Orders   EKG 12-Lead (Completed)   Erectile dysfunction    EKG - SR with no acute ischemic changes.  rx for cialis given.  Discussed possible side effects.  Follow.        Essential  hypertension    On lisinopril.  Blood pressure on recheck elevated with readings 140s.  Will increase lisinopril to 20mg  q day.  Follow pressures.  Get him back in soon to reassess.  Follow metabolic panel.        Relevant Medications   lisinopril (PRINIVIL,ZESTRIL) 20 MG tablet   tadalafil (CIALIS) 10 MG tablet   Hyperlipidemia    Low cholesterol diet and exercise.  Follow lipid panel.        Relevant Medications   lisinopril (PRINIVIL,ZESTRIL) 20 MG tablet   tadalafil (CIALIS) 10 MG tablet   Hypothyroidism    Has not started thyroid replacement.  Needs to start.  rx sent in.  Recheck tsh in 6 weeks.        Relevant Medications   levothyroxine (SYNTHROID, LEVOTHROID) 50 MCG tablet   Microalbuminuria    On lisinopril.  Increase to 20mg  q day.  Follow.        Neuropathy (HCC)    On gabapentin.  Increase to two qid.  Follow.            [name], MD

## 2015-10-01 ENCOUNTER — Encounter: Payer: Self-pay | Admitting: Internal Medicine

## 2015-10-01 DIAGNOSIS — N529 Male erectile dysfunction, unspecified: Secondary | ICD-10-CM | POA: Insufficient documentation

## 2015-10-01 NOTE — Assessment & Plan Note (Signed)
On lisinopril.  Blood pressure on recheck elevated with readings 140s.  Will increase lisinopril to 20mg  q day.  Follow pressures.  Get him back in soon to reassess.  Follow metabolic panel.

## 2015-10-01 NOTE — Assessment & Plan Note (Signed)
On lisinopril.  Increase to 20mg  q day.  Follow.

## 2015-10-01 NOTE — Assessment & Plan Note (Signed)
Has not started thyroid replacement.  Needs to start.  rx sent in.  Recheck tsh in 6 weeks.

## 2015-10-01 NOTE — Assessment & Plan Note (Signed)
Low cholesterol diet and exercise.  Follow lipid panel.   

## 2015-10-01 NOTE — Assessment & Plan Note (Signed)
On gabapentin.  Increase to two qid.  Follow.

## 2015-10-01 NOTE — Assessment & Plan Note (Signed)
Sugars poorly controlled.  Discussed with him today.  He has declined to start medication previously.  Discussed with him today - as outlined.  Start metformin bid.  Follow sugars.  Get him back in soon to reassess.  Needs eye exam.

## 2015-10-01 NOTE — Assessment & Plan Note (Signed)
EKG - SR with no acute ischemic changes.  rx for cialis given.  Discussed possible side effects.  Follow.

## 2015-10-02 ENCOUNTER — Other Ambulatory Visit: Payer: Self-pay | Admitting: Internal Medicine

## 2015-10-02 ENCOUNTER — Telehealth: Payer: Self-pay | Admitting: Internal Medicine

## 2015-10-02 NOTE — Telephone Encounter (Signed)
Please advise 

## 2015-10-02 NOTE — Telephone Encounter (Signed)
Order faxed pt notified.

## 2015-10-02 NOTE — Telephone Encounter (Signed)
Pt called returning your call. Pt states the fax number  for lab work to [facility]  is [phone]. Thank you!

## 2015-10-03 LAB — POTASSIUM: Potassium: 4.8 mmol/L (ref 3.5–5.2)

## 2015-10-09 ENCOUNTER — Other Ambulatory Visit: Payer: Self-pay | Admitting: Internal Medicine

## 2015-10-16 ENCOUNTER — Telehealth: Payer: Self-pay | Admitting: Internal Medicine

## 2015-10-16 ENCOUNTER — Other Ambulatory Visit: Payer: Self-pay

## 2015-10-16 MED ORDER — GABAPENTIN 100 MG PO CAPS: ORAL_CAPSULE | ORAL | Status: DC

## 2015-10-16 NOTE — Telephone Encounter (Signed)
Pt called about needing a refill on his medication gabapentin (NEURONTIN) 100 MG capsule. Pharmacy is [facility] [place], [place]. Call pt @ [phone]. Thank you!

## 2015-10-16 NOTE — Telephone Encounter (Signed)
Filled

## 2015-10-17 LAB — HM DIABETES EYE EXAM

## 2015-10-17 NOTE — Telephone Encounter (Signed)
[place], [place] [phone] (Phone) [phone] (Fax

## 2015-10-17 NOTE — Telephone Encounter (Signed)
Patient stated that he should be taking 300mg  3x a day. Patients insurance will not cover, until [facility] receive the new dosage ogf this perscription.

## 2015-10-17 NOTE — Telephone Encounter (Signed)
Pt called returning your call from yesterday. Call pt @ [phone]. Thank you!

## 2015-10-17 NOTE — Telephone Encounter (Signed)
I did not call this patient.

## 2015-10-18 ENCOUNTER — Telehealth: Payer: Self-pay

## 2015-10-18 ENCOUNTER — Other Ambulatory Visit: Payer: Self-pay

## 2015-10-18 MED ORDER — GABAPENTIN 300 MG PO CAPS: ORAL_CAPSULE | ORAL | Status: DC

## 2015-10-18 NOTE — Telephone Encounter (Signed)
I had informed him that we could titrate up the medication as needed and as tolerated.  I start with 100mg  first.  If we titrate to the dose of 300mg , then can use the 300mg  dose.  He was on 100mg  tid and we increased to 2 (100mg ) when at visit.  How much is he actually taking now?

## 2015-10-18 NOTE — Telephone Encounter (Signed)
Patient called about Gabapentin stating that you and him discussed increasing it to 300 mg, but it was sent in for 100 mg. Please advise that medication?

## 2015-10-20 NOTE — Telephone Encounter (Signed)
Left a VM to return my call to clarify what medication dose he is taking.

## 2015-11-08 ENCOUNTER — Ambulatory Visit: Payer: 59 | Admitting: Internal Medicine

## 2015-12-06 ENCOUNTER — Telehealth: Payer: Self-pay | Admitting: Internal Medicine

## 2015-12-06 MED ORDER — GABAPENTIN 300 MG PO CAPS: ORAL_CAPSULE | ORAL | Status: DC

## 2015-12-06 NOTE — Telephone Encounter (Signed)
Refilled. Thanks had to resend to correct pharmacy.

## 2015-12-06 NOTE — Telephone Encounter (Signed)
Pt called needing a refill on gabapentin (NEURONTIN) 300 MG capsule sent to [place], [place]. Today is pt last dose.Marland Kitchen

## 2015-12-12 ENCOUNTER — Ambulatory Visit: Payer: 59 | Admitting: Internal Medicine

## 2016-01-04 ENCOUNTER — Ambulatory Visit: Payer: 59 | Admitting: Internal Medicine

## 2016-01-24 ENCOUNTER — Other Ambulatory Visit: Payer: Self-pay | Admitting: Internal Medicine

## 2016-02-22 ENCOUNTER — Encounter (INDEPENDENT_AMBULATORY_CARE_PROVIDER_SITE_OTHER): Payer: Self-pay

## 2016-02-22 ENCOUNTER — Ambulatory Visit (INDEPENDENT_AMBULATORY_CARE_PROVIDER_SITE_OTHER): Payer: 59 | Admitting: Internal Medicine

## 2016-02-22 ENCOUNTER — Encounter: Payer: Self-pay | Admitting: Internal Medicine

## 2016-02-22 VITALS — BP 120/80 | HR 79 | Temp 98.1°F | Resp 18 | Ht 71.25 in | Wt 257.4 lb

## 2016-02-22 DIAGNOSIS — E785 Hyperlipidemia, unspecified: Secondary | ICD-10-CM

## 2016-02-22 DIAGNOSIS — E039 Hypothyroidism, unspecified: Secondary | ICD-10-CM

## 2016-02-22 DIAGNOSIS — I10 Essential (primary) hypertension: Secondary | ICD-10-CM

## 2016-02-22 DIAGNOSIS — E11319 Type 2 diabetes mellitus with unspecified diabetic retinopathy without macular edema: Secondary | ICD-10-CM

## 2016-02-22 DIAGNOSIS — G629 Polyneuropathy, unspecified: Secondary | ICD-10-CM

## 2016-02-22 DIAGNOSIS — R809 Proteinuria, unspecified: Secondary | ICD-10-CM | POA: Diagnosis not present

## 2016-02-22 MED ORDER — LEVOTHYROXINE SODIUM 50 MCG PO TABS: ORAL_TABLET | ORAL | Status: DC

## 2016-02-22 MED ORDER — GABAPENTIN 300 MG PO CAPS: ORAL_CAPSULE | ORAL | Status: DC

## 2016-02-22 MED ORDER — LISINOPRIL 20 MG PO TABS: ORAL_TABLET | ORAL | Status: DC

## 2016-02-22 NOTE — Progress Notes (Signed)
Pre-visit discussion using our clinic review tool. No additional management support is needed unless otherwise documented below in the visit note.  

## 2016-02-22 NOTE — Progress Notes (Signed)
Patient ID: Gregory Crane, male   DOB: 10-11-1970, 45 y.o.   MRN: [id number]   Subjective:    Patient ID: Gregory Crane, male    DOB: 1971/07/17, 45 y.o.   MRN: [id number]  HPI  Patient here for a scheduled follow up.  He has been under increased stress recently.  Not watching his diet or exercising.  He is planning to get more serious about diet and exercise.  Discussed today.  States sugars averaging 120-200 in the am - averaging 150s.  Average in pm - 190.  No chest pain.  No sob.  No acid reflux.  No abdominal pain or cramping.  Bowels stable.  Not taking his metformin on a regular basis. Skips doses. States taking blood pressure medication.     Past Medical History  Diagnosis Date  . Hypothyroidism   . Diabetes mellitus without complication ([place])   . Allergy   . Kidney stones    Past Surgical History  Procedure Laterality Date  . No past surgeries     Family History  Problem Relation Age of Onset  . Breast cancer Mother   . Diabetes Father   . Diabetes Sister   . Arthritis Maternal Grandmother   . Diabetes Maternal Grandmother    Social History   Social History  . Marital Status: Married    Spouse Name: N/A  . Number of Children: N/A  . Years of Education: N/A   Social History Main Topics  . Smoking status: Never Smoker   . Smokeless tobacco: Never Used  . Alcohol Use: 0.0 oz/week    0 Standard drinks or equivalent per week     Comment: very rarely (1-2/year)  . Drug Use: No  . Sexual Activity: Not Asked   Other Topics Concern  . None   Social History Narrative    Outpatient Encounter Prescriptions as of 02/22/2016  Medication Sig  . gabapentin (NEURONTIN) 300 MG capsule Take 2 capsules by mouth 3  times daily  . glucose blood (FREESTYLE LITE) test strip Check blood sugars twice a day (Dx. 250.02)  . levothyroxine (SYNTHROID, LEVOTHROID) 50 MCG tablet Take 1 tablet by mouth  daily  . lisinopril (PRINIVIL,ZESTRIL) 20 MG tablet Take 1 tablet by mouth   daily  . metFORMIN (GLUCOPHAGE) 1000 MG tablet Take 1 tablet (1,000 mg total) by mouth 2 (two) times daily with a meal.  . tadalafil (CIALIS) 10 MG tablet Take as directed.  . [DISCONTINUED] gabapentin (NEURONTIN) 300 MG capsule Take 1 capsule by mouth 3  times daily  . [DISCONTINUED] gabapentin (NEURONTIN) 300 MG capsule Take 1 capsule by mouth 3  times daily  . [DISCONTINUED] levothyroxine (SYNTHROID, LEVOTHROID) 50 MCG tablet Take 1 tablet by mouth  daily  . [DISCONTINUED] lisinopril (PRINIVIL,ZESTRIL) 20 MG tablet Take 1 tablet by mouth  daily   No facility-administered encounter medications on file as of 02/22/2016.    Review of Systems  Constitutional: Negative for appetite change and unexpected weight change.  HENT: Negative for congestion and sinus pressure.   Respiratory: Negative for cough, chest tightness and shortness of breath.   Cardiovascular: Negative for chest pain, palpitations and leg swelling.  Gastrointestinal: Negative for nausea, vomiting, abdominal pain and diarrhea.  Genitourinary: Negative for dysuria and difficulty urinating.  Musculoskeletal: Negative for back pain and joint swelling.  Skin: Negative for color change and rash.  Neurological: Negative for dizziness, light-headedness and headaches.  Psychiatric/Behavioral: Negative for dysphoric mood and agitation.  Objective:    Physical Exam  Constitutional: He appears well-developed and well-nourished. No distress.  HENT:  Nose: Nose normal.  Mouth/Throat: Oropharynx is clear and moist.  Neck: Neck supple. No thyromegaly present.  Cardiovascular: Normal rate and regular rhythm.   Pulmonary/Chest: Effort normal and breath sounds normal. No respiratory distress.  Abdominal: Soft. Bowel sounds are normal. There is no tenderness.  Musculoskeletal: He exhibits no edema or tenderness.  Lymphadenopathy:    He has no cervical adenopathy.  Skin: No rash noted. No erythema.  Psychiatric: He has a normal  mood and affect. His behavior is normal.    BP 120/80 mmHg  Pulse 79  Temp(Src) 98.1 F (36.7 C) (Oral)  Resp 18  Ht 5' 11.25" (1.81 m)  Wt 257 lb 6 oz (116.745 kg)  BMI 35.64 kg/m2  SpO2 97% Wt Readings from Last 3 Encounters:  02/22/16 257 lb 6 oz (116.745 kg)  09/28/15 253 lb 8 oz (114.987 kg)  08/03/15 250 lb 8 oz (113.626 kg)     Lab Results  Component Value Date   WBC 6.6 08/11/2015   HGB 16.7 01/26/2014   HCT 48.3 08/11/2015   PLT 248 08/11/2015   GLUCOSE 239* 09/27/2015   CHOL 166 08/11/2015   TRIG 191* 08/11/2015   HDL 33* 08/11/2015   LDLCALC 95 08/11/2015   ALT 14 08/11/2015   AST 12 08/11/2015   NA 137 09/27/2015   K 4.8 10/02/2015   CL 97 09/27/2015   CREATININE 0.93 09/27/2015   BUN 14 09/27/2015   CO2 25 09/27/2015   TSH 10.890* 09/27/2015   HGBA1C 10.6* 08/11/2015        Assessment & Plan:   Problem List Items Addressed This Visit    Diabetes ([place]) - Primary    Low carb diet and exercise.  Discussed with him today.  He plans to get more serious about diet and exercise.  Follow met b and a1c.  Discussed the need to take his medication regularly and not skp doses.        Relevant Medications   lisinopril (PRINIVIL,ZESTRIL) 20 MG tablet   Essential hypertension    On lisinopril.  Blood pressure is doing well.  Follow.  Follow metabolic panel.       Relevant Medications   lisinopril (PRINIVIL,ZESTRIL) 20 MG tablet   Hyperlipidemia    Low cholesterol diet and exercise.  Follow lipid panel.        Relevant Medications   lisinopril (PRINIVIL,ZESTRIL) 20 MG tablet   Hypothyroidism    On thyroid replacement.  Follow tsh.       Relevant Medications   levothyroxine (SYNTHROID, LEVOTHROID) 50 MCG tablet   Microalbuminuria    On lisinopril.  Follow.       Neuropathy ([facility])    Gabapentin is helping.  Feels needs a higher dose.  Adjust dose.  Increase as tolerated.  Follow.            [name], MD

## 2016-02-24 ENCOUNTER — Encounter: Payer: Self-pay | Admitting: Internal Medicine

## 2016-02-24 NOTE — Assessment & Plan Note (Signed)
On lisinopril.  Follow.  

## 2016-02-24 NOTE — Assessment & Plan Note (Signed)
On lisinopril.  Blood pressure is doing well.  Follow.  Follow metabolic panel.

## 2016-02-24 NOTE — Assessment & Plan Note (Signed)
On thyroid replacement.  Follow tsh.  

## 2016-02-24 NOTE — Assessment & Plan Note (Signed)
Low cholesterol diet and exercise.  Follow lipid panel.   

## 2016-02-24 NOTE — Assessment & Plan Note (Signed)
Gabapentin is helping.  Feels needs a higher dose.  Adjust dose.  Increase as tolerated.  Follow.

## 2016-02-24 NOTE — Assessment & Plan Note (Signed)
Low carb diet and exercise.  Discussed with him today.  He plans to get more serious about diet and exercise.  Follow met b and a1c.  Discussed the need to take his medication regularly and not skp doses.

## 2016-02-29 ENCOUNTER — Telehealth: Payer: Self-pay | Admitting: *Deleted

## 2016-02-29 DIAGNOSIS — I10 Essential (primary) hypertension: Secondary | ICD-10-CM

## 2016-02-29 DIAGNOSIS — Z125 Encounter for screening for malignant neoplasm of prostate: Secondary | ICD-10-CM

## 2016-02-29 DIAGNOSIS — E11319 Type 2 diabetes mellitus with unspecified diabetic retinopathy without macular edema: Secondary | ICD-10-CM

## 2016-02-29 DIAGNOSIS — E785 Hyperlipidemia, unspecified: Secondary | ICD-10-CM

## 2016-02-29 DIAGNOSIS — E039 Hypothyroidism, unspecified: Secondary | ICD-10-CM

## 2016-02-29 DIAGNOSIS — E538 Deficiency of other specified B group vitamins: Secondary | ICD-10-CM

## 2016-02-29 NOTE — Telephone Encounter (Signed)
Orders placed for labs.  Please notify pt.

## 2016-02-29 NOTE — Telephone Encounter (Signed)
Notified patient. thanks 

## 2016-02-29 NOTE — Telephone Encounter (Signed)
Pt stated that he was to receive labs , however [facility] was drawing. [facility] has not received the lab orders. Pt contact [phone]

## 2016-02-29 NOTE — Telephone Encounter (Signed)
Please advise for lab orders, patient to have them drawn at [facility]. thanks

## 2016-03-01 LAB — HEMOGLOBIN A1C
Est. average glucose Bld gHb Est-mCnc: 237 mg/dL
Hgb A1c MFr Bld: 9.9 % — ABNORMAL HIGH (ref 4.8–5.6)

## 2016-03-01 LAB — HEPATIC FUNCTION PANEL
ALT: 10 IU/L (ref 0–44)
AST: 11 IU/L (ref 0–40)
Albumin: 4 g/dL (ref 3.5–5.5)
Alkaline Phosphatase: 58 IU/L (ref 39–117)
Bilirubin Total: 0.6 mg/dL (ref 0.0–1.2)
Bilirubin, Direct: 0.18 mg/dL (ref 0.00–0.40)
Total Protein: 7 g/dL (ref 6.0–8.5)

## 2016-03-01 LAB — LIPID PANEL
Chol/HDL Ratio: 3.7 ratio units (ref 0.0–5.0)
Cholesterol, Total: 129 mg/dL (ref 100–199)
HDL: 35 mg/dL — ABNORMAL LOW (ref >39)
LDL Calculated: 74 mg/dL (ref 0–99)
Triglycerides: 101 mg/dL (ref 0–149)
VLDL Cholesterol Cal: 20 mg/dL (ref 5–40)

## 2016-03-01 LAB — BASIC METABOLIC PANEL
BUN/Creatinine Ratio: 14 (ref 9–20)
BUN: 16 mg/dL (ref 6–24)
CO2: 25 mmol/L (ref 18–29)
Calcium: 8.7 mg/dL (ref 8.7–10.2)
Chloride: 95 mmol/L — ABNORMAL LOW (ref 96–106)
Creatinine, Ser: 1.11 mg/dL (ref 0.76–1.27)
GFR calc Af Amer: 92 mL/min/{1.73_m2} (ref >59)
GFR calc non Af Amer: 80 mL/min/{1.73_m2} (ref >59)
Glucose: 156 mg/dL — ABNORMAL HIGH (ref 65–99)
Potassium: 4.5 mmol/L (ref 3.5–5.2)
Sodium: 135 mmol/L (ref 134–144)

## 2016-03-01 LAB — TSH: TSH: 16.64 u[IU]/mL — ABNORMAL HIGH (ref 0.450–4.500)

## 2016-03-01 LAB — PSA: Prostate Specific Ag, Serum: 0.3 ng/mL (ref 0.0–4.0)

## 2016-03-01 LAB — VITAMIN B12: Vitamin B-12: 101 pg/mL — ABNORMAL LOW (ref 211–946)

## 2016-03-02 ENCOUNTER — Telehealth: Payer: Self-pay | Admitting: Internal Medicine

## 2016-03-02 DIAGNOSIS — E039 Hypothyroidism, unspecified: Secondary | ICD-10-CM

## 2016-03-02 NOTE — Telephone Encounter (Signed)
Order placed for f/u tsh to be drawn at [facility].  

## 2016-03-04 ENCOUNTER — Telehealth: Payer: Self-pay | Admitting: *Deleted

## 2016-03-04 ENCOUNTER — Other Ambulatory Visit: Payer: Self-pay

## 2016-03-04 MED ORDER — CYANOCOBALAMIN 1000 MCG/ML IJ SOLN: 1000.0000 ug | INTRAMUSCULAR | Status: DC

## 2016-03-04 MED ORDER — LEVOTHYROXINE SODIUM 75 MCG PO TABS: ORAL_TABLET | ORAL | Status: DC

## 2016-03-04 MED ORDER — "SYRINGE/NEEDLE (DISP) 25G X 1"" 3 ML MISC": Status: DC

## 2016-03-04 NOTE — Telephone Encounter (Signed)
Pt requested lab results from 02/29/16 Pt contact [phone]

## 2016-03-04 NOTE — Telephone Encounter (Signed)
Spoke with patient and reviewed labs, see result note. thanks

## 2016-03-19 ENCOUNTER — Encounter: Payer: Self-pay | Admitting: Internal Medicine

## 2016-03-20 ENCOUNTER — Encounter: Payer: Self-pay | Admitting: Internal Medicine

## 2016-04-11 ENCOUNTER — Other Ambulatory Visit: Payer: Self-pay | Admitting: Internal Medicine

## 2016-05-23 ENCOUNTER — Ambulatory Visit: Payer: 59 | Admitting: Internal Medicine

## 2016-06-03 ENCOUNTER — Other Ambulatory Visit: Payer: Self-pay | Admitting: Internal Medicine

## 2016-06-21 ENCOUNTER — Encounter: Payer: Self-pay | Admitting: Internal Medicine

## 2016-06-21 ENCOUNTER — Ambulatory Visit (INDEPENDENT_AMBULATORY_CARE_PROVIDER_SITE_OTHER): Payer: 59 | Admitting: Internal Medicine

## 2016-06-21 VITALS — BP 124/70 | HR 95 | Temp 97.5°F | Ht 71.0 in | Wt 252.0 lb

## 2016-06-21 DIAGNOSIS — R809 Proteinuria, unspecified: Secondary | ICD-10-CM | POA: Diagnosis not present

## 2016-06-21 DIAGNOSIS — E039 Hypothyroidism, unspecified: Secondary | ICD-10-CM

## 2016-06-21 DIAGNOSIS — G629 Polyneuropathy, unspecified: Secondary | ICD-10-CM | POA: Diagnosis not present

## 2016-06-21 DIAGNOSIS — E785 Hyperlipidemia, unspecified: Secondary | ICD-10-CM

## 2016-06-21 DIAGNOSIS — R55 Syncope and collapse: Secondary | ICD-10-CM

## 2016-06-21 DIAGNOSIS — I10 Essential (primary) hypertension: Secondary | ICD-10-CM

## 2016-06-21 DIAGNOSIS — E11319 Type 2 diabetes mellitus with unspecified diabetic retinopathy without macular edema: Secondary | ICD-10-CM

## 2016-06-21 DIAGNOSIS — E538 Deficiency of other specified B group vitamins: Secondary | ICD-10-CM

## 2016-06-21 MED ORDER — GABAPENTIN 300 MG PO CAPS: ORAL_CAPSULE | ORAL | 0 refills | Status: DC

## 2016-06-21 NOTE — Progress Notes (Signed)
Pre visit review using our clinic review tool, if applicable. No additional management support is needed unless otherwise documented below in the visit note. 

## 2016-06-21 NOTE — Progress Notes (Signed)
Patient ID: Gregory Crane, male   DOB: March 18, 1971, 45 y.o.   MRN: [id number]   Subjective:    Patient ID: Gregory Crane, male    DOB: [birth year], 45 y.o.   MRN: [id number]  HPI  Patient here for a scheduled follow up.  He has adjusted his diet.  Following a low carb diet.  Just started over the last few weeks.  Not exercising.  Plans to start.  Has not been checking his sugars regularly.  States have started trending down.  Taking medication regularly.  States he has noticed when he exerts himself - will feel some light headedness.  States feels like is going to pass out (at times).  Will feel washed out.  No sob.  States sometimes will notice when stands up after sitting.  Mostly occurs with increased activity.  No nausea or vomiting.  No abdominal pain or cramping.  Bowels stable.  Has neuropathy.  On gabapentin.  Taking two tablets tid.  Helps some.  Still some increased burning.  Night appears to be worse.     Past Medical History:  Diagnosis Date  . Allergy   . Diabetes mellitus without complication ([place])   . Hypothyroidism   . Kidney stones    Past Surgical History:  Procedure Laterality Date  . NO PAST SURGERIES     Family History  Problem Relation Age of Onset  . Breast cancer Mother   . Diabetes Father   . Diabetes Sister   . Arthritis Maternal Grandmother   . Diabetes Maternal Grandmother    Social History   Social History  . Marital status: Married    Spouse name: N/A  . Number of children: N/A  . Years of education: N/A   Social History Main Topics  . Smoking status: Never Smoker  . Smokeless tobacco: Never Used  . Alcohol use 0.0 oz/week     Comment: very rarely (1-2/year)  . Drug use: No  . Sexual activity: Not Asked   Other Topics Concern  . None   Social History Narrative  . None    Outpatient Encounter Prescriptions as of 06/21/2016  Medication Sig  . cyanocobalamin (,VITAMIN B-12,) 1000 MCG/ML injection Inject 1 mL (1,000 mcg total) into the  muscle every 30 (thirty) days. Inject once a week for 6 weeks then once a month thereafter  . gabapentin (NEURONTIN) 300 MG capsule Take 2 capsules by mouth 3  times daily  . glucose blood (FREESTYLE LITE) test strip Check blood sugars twice a day (Dx. 250.02)  . levothyroxine (SYNTHROID, LEVOTHROID) 75 MCG tablet TAKE 1 TABLET BY MOUTH  DAILY  . lisinopril (PRINIVIL,ZESTRIL) 20 MG tablet TAKE 1 TABLET BY MOUTH  DAILY  . metFORMIN (GLUCOPHAGE) 1000 MG tablet Take 1 tablet (1,000 mg total) by mouth 2 (two) times daily with a meal.  . SYRINGE-NEEDLE, DISP, 3 ML 25G X 1" 3 ML MISC Use to administer weekly b12 injections, then month after 6 weeks. thanks  . tadalafil (CIALIS) 10 MG tablet Take as directed.  . [DISCONTINUED] gabapentin (NEURONTIN) 300 MG capsule Take 2 capsules by mouth 3  times daily   No facility-administered encounter medications on file as of 06/21/2016.     Review of Systems  Constitutional: Negative for appetite change and unexpected weight change.  HENT: Negative for congestion and sinus pressure.   Respiratory: Negative for cough, chest tightness and shortness of breath.        Near syncope - as outlined.  Cardiovascular: Negative for chest pain, palpitations and leg swelling.  Gastrointestinal: Negative for abdominal pain, diarrhea, nausea and vomiting.  Genitourinary: Negative for difficulty urinating and dysuria.  Musculoskeletal: Negative for back pain and joint swelling.  Skin: Negative for color change and rash.  Neurological: Positive for light-headedness. Negative for dizziness and headaches.  Psychiatric/Behavioral: Negative for agitation and dysphoric mood.       Objective:    Physical Exam  Constitutional: He appears well-developed and well-nourished. No distress.  HENT:  Nose: Nose normal.  Mouth/Throat: Oropharynx is clear and moist.  Neck: Neck supple. No thyromegaly present.  Cardiovascular: Normal rate and regular rhythm.   Pulmonary/Chest:  Effort normal and breath sounds normal. No respiratory distress.  Abdominal: Soft. Bowel sounds are normal. There is no tenderness.  Musculoskeletal: He exhibits no edema or tenderness.  Lymphadenopathy:    He has no cervical adenopathy.  Skin: No rash noted. No erythema.  Psychiatric: He has a normal mood and affect. His behavior is normal.    BP 124/70   Pulse 95   Temp 97.5 F (36.4 C) (Oral)   Ht '5\' 11"'$  (1.803 m)   Wt 252 lb (114.3 kg)   SpO2 97%   BMI 35.15 kg/m  Wt Readings from Last 3 Encounters:  06/21/16 252 lb (114.3 kg)  02/22/16 257 lb 6 oz (116.7 kg)  09/28/15 253 lb 8 oz (115 kg)     Lab Results  Component Value Date   WBC 6.6 08/11/2015   HGB 16.7 01/26/2014   HCT 48.3 08/11/2015   PLT 248 08/11/2015   GLUCOSE 156 (H) 02/29/2016   CHOL 129 02/29/2016   TRIG 101 02/29/2016   HDL 35 (L) 02/29/2016   LDLCALC 74 02/29/2016   ALT 10 02/29/2016   AST 11 02/29/2016   NA 135 02/29/2016   K 4.5 02/29/2016   CL 95 (L) 02/29/2016   CREATININE 1.11 02/29/2016   BUN 16 02/29/2016   CO2 25 02/29/2016   TSH 16.640 (H) 02/29/2016   HGBA1C 9.9 (H) 02/29/2016       Assessment & Plan:   Problem List Items Addressed This Visit    B12 deficiency    Check B12 level.       Diabetes ([place])    Sugars have been poorly controlled.  Has adjusted his diet.  Losing weight.  Plans to start exercise.  Follow met b and a1c.        Relevant Orders   Hemoglobin A1c   Essential hypertension    Blood pressure under good control.  Continue same medication regimen.  Follow pressures.  Follow metabolic panel.  Not orthostatic on exam.       Relevant Orders   CBC with Differential/Platelet   Basic metabolic panel   Hyperlipidemia    Low cholesterol diet and exercise.  Follow lipid panel.       Relevant Orders   Hepatic function panel   Lipid panel   Hypothyroidism    On thyroid replacement.  Follow tsh.       Relevant Orders   TSH   Microalbuminuria    On  lisinopril.  Follow.       Near syncope - Primary    Light headedness and near syncope as outlined.  EKG reveals SR with no acute ischemic changes.  Discussed further w/up.  Given risk factors and symptoms especially noted with increased activity and exertion, will refer to cardiology for further evaluation and w/up.  Pt in agreement.  Relevant Orders   EKG 12-Lead (Completed)   Ambulatory referral to Cardiology   Neuropathy ([place])    On gabapentin.  Feels helps some.  Get sugars under better control.         Other Visit Diagnoses   None.      [name], MD

## 2016-06-23 ENCOUNTER — Encounter: Payer: Self-pay | Admitting: Internal Medicine

## 2016-06-23 DIAGNOSIS — R55 Syncope and collapse: Secondary | ICD-10-CM | POA: Insufficient documentation

## 2016-06-23 NOTE — Assessment & Plan Note (Signed)
Low cholesterol diet and exercise.  Follow lipid panel.   

## 2016-06-23 NOTE — Assessment & Plan Note (Signed)
On lisinopril.  Follow.

## 2016-06-23 NOTE — Assessment & Plan Note (Signed)
Light headedness and near syncope as outlined.  EKG reveals SR with no acute ischemic changes.  Discussed further w/up.  Given risk factors and symptoms especially noted with increased activity and exertion, will refer to cardiology for further evaluation and w/up.  Pt in agreement.

## 2016-06-23 NOTE — Assessment & Plan Note (Signed)
Blood pressure under good control.  Continue same medication regimen.  Follow pressures.  Follow metabolic panel.  Not orthostatic on exam.

## 2016-06-23 NOTE — Assessment & Plan Note (Signed)
Check B12 level. 

## 2016-06-23 NOTE — Assessment & Plan Note (Signed)
Sugars have been poorly controlled.  Has adjusted his diet.  Losing weight.  Plans to start exercise.  Follow met b and a1c.

## 2016-06-23 NOTE — Assessment & Plan Note (Signed)
On thyroid replacement.  Follow tsh.  

## 2016-06-23 NOTE — Assessment & Plan Note (Signed)
On gabapentin.  Feels helps some.  Get sugars under better control.

## 2016-06-28 LAB — LIPID PANEL
Chol/HDL Ratio: 4.4 ratio units (ref 0.0–5.0)
Cholesterol, Total: 136 mg/dL (ref 100–199)
HDL: 31 mg/dL — ABNORMAL LOW (ref >39)
LDL Calculated: 74 mg/dL (ref 0–99)
Triglycerides: 155 mg/dL — ABNORMAL HIGH (ref 0–149)
VLDL Cholesterol Cal: 31 mg/dL (ref 5–40)

## 2016-06-28 LAB — CBC WITH DIFFERENTIAL/PLATELET
Basophils Absolute: 0 10*3/uL (ref 0.0–0.2)
Basos: 0 %
EOS (ABSOLUTE): 0.1 10*3/uL (ref 0.0–0.4)
Eos: 2 %
Hematocrit: 43.7 % (ref 37.5–51.0)
Hemoglobin: 15.4 g/dL (ref 12.6–17.7)
Immature Grans (Abs): 0 10*3/uL (ref 0.0–0.1)
Immature Granulocytes: 0 %
Lymphocytes Absolute: 1.4 10*3/uL (ref 0.7–3.1)
Lymphs: 27 %
MCH: 29.9 pg (ref 26.6–33.0)
MCHC: 35.2 g/dL (ref 31.5–35.7)
MCV: 85 fL (ref 79–97)
Monocytes Absolute: 0.4 10*3/uL (ref 0.1–0.9)
Monocytes: 7 %
Neutrophils Absolute: 3.3 10*3/uL (ref 1.4–7.0)
Neutrophils: 64 %
Platelets: 232 10*3/uL (ref 150–379)
RBC: 5.15 x10E6/uL (ref 4.14–5.80)
RDW: 12.5 % (ref 12.3–15.4)
WBC: 5.2 10*3/uL (ref 3.4–10.8)

## 2016-06-28 LAB — BASIC METABOLIC PANEL
BUN/Creatinine Ratio: 21 — ABNORMAL HIGH (ref 9–20)
BUN: 21 mg/dL (ref 6–24)
CO2: 22 mmol/L (ref 18–29)
Calcium: 9.2 mg/dL (ref 8.7–10.2)
Chloride: 99 mmol/L (ref 96–106)
Creatinine, Ser: 0.98 mg/dL (ref 0.76–1.27)
GFR calc Af Amer: 107 mL/min/{1.73_m2} (ref >59)
GFR calc non Af Amer: 93 mL/min/{1.73_m2} (ref >59)
Glucose: 126 mg/dL — ABNORMAL HIGH (ref 65–99)
Potassium: 4.8 mmol/L (ref 3.5–5.2)
Sodium: 138 mmol/L (ref 134–144)

## 2016-06-28 LAB — HEPATIC FUNCTION PANEL
ALT: 13 IU/L (ref 0–44)
AST: 19 IU/L (ref 0–40)
Albumin: 4 g/dL (ref 3.5–5.5)
Alkaline Phosphatase: 63 IU/L (ref 39–117)
Bilirubin Total: 0.4 mg/dL (ref 0.0–1.2)
Bilirubin, Direct: 0.11 mg/dL (ref 0.00–0.40)
Total Protein: 7.4 g/dL (ref 6.0–8.5)

## 2016-06-28 LAB — HEMOGLOBIN A1C
Est. average glucose Bld gHb Est-mCnc: 220 mg/dL
Hgb A1c MFr Bld: 9.3 % — ABNORMAL HIGH (ref 4.8–5.6)

## 2016-06-28 LAB — TSH: TSH: 7.85 u[IU]/mL — ABNORMAL HIGH (ref 0.450–4.500)

## 2016-07-02 ENCOUNTER — Telehealth: Payer: Self-pay | Admitting: Internal Medicine

## 2016-07-02 ENCOUNTER — Encounter: Payer: Self-pay | Admitting: Internal Medicine

## 2016-07-02 ENCOUNTER — Ambulatory Visit (INDEPENDENT_AMBULATORY_CARE_PROVIDER_SITE_OTHER): Payer: 59 | Admitting: Internal Medicine

## 2016-07-02 VITALS — BP 127/84 | HR 83 | Ht 71.5 in | Wt 249.2 lb

## 2016-07-02 DIAGNOSIS — I10 Essential (primary) hypertension: Secondary | ICD-10-CM

## 2016-07-02 DIAGNOSIS — R55 Syncope and collapse: Secondary | ICD-10-CM | POA: Diagnosis not present

## 2016-07-02 DIAGNOSIS — I951 Orthostatic hypotension: Secondary | ICD-10-CM

## 2016-07-02 DIAGNOSIS — E039 Hypothyroidism, unspecified: Secondary | ICD-10-CM

## 2016-07-02 DIAGNOSIS — E11319 Type 2 diabetes mellitus with unspecified diabetic retinopathy without macular edema: Secondary | ICD-10-CM | POA: Diagnosis not present

## 2016-07-02 MED ORDER — LISINOPRIL 10 MG PO TABS: 10.0000 mg | ORAL_TABLET | Freq: Every day | ORAL | 0 refills | Status: DC

## 2016-07-02 MED ORDER — LEVOTHYROXINE SODIUM 100 MCG PO TABS: 100.0000 ug | ORAL_TABLET | Freq: Every day | ORAL | 0 refills | Status: DC

## 2016-07-02 NOTE — Progress Notes (Signed)
New Outpatient Visit Date: 07/02/2016  Referring Provider: [name], MD [address] [place], [place] [address]  Chief Complaint: dizziness  HPI:  Gregory Crane is a 45 y.o. year-old male with history of type 2 diabetes mellitus and kidney stones, who has been referred by Dr. Nicki Reaper for evaluation of exertional dizziness. He reports that over the last several months, he has experienced lightheadedness with mild to moderate exertion such as climbing stairs or walking briskly. He also notices this when bending over. He will sit down and allow the symptoms to pass over the course of the next 1-4 minutes. He has never passed out. He associates a rapid heartbeat with the lightheadedness. He has not experienced any chest pain or shortness of breath, though he had one episode of a sharp pain involving the left arm about 5 weeks ago that was unrelated dizziness or palpitations. He does not exercise on a regular basis. He last felt "normal" about a year ago.  Gregory Crane reports a remote history of vertigo but states that his more recent symptoms are remarkably different. He has not had any lower extremity swelling, orthopnea, or PND. He reports neuropathy in his legs that he attributes to his history of diabetes. He was diagnosed with type 2 diabetes about 3 years ago but believes he had this for at least a few years prior to the diagnosis. His most recent hemoglobin A1c was 9.3, which he has been trying to bring down with lifestyle modifications. He is currently not on any diabetic medications; he discontinued metformin out of concern for potential side effects.  The patient rarely drinks caffeinated diet sodas but otherwise does not caffeine on a regular basis. He has not undergone previous cardiovascular evaluation. He does not have any personal history of cardiovascular disease. He does not recall any medication changes around the time that his lightheadedness began. However, he and his  wife are concerned that some of his symptoms could be due to lisinopril.  --------------------------------------------------------------------------------------------------  Cardiovascular History & Procedures: Cardiovascular Problems:  Orthostatic hypotension  Risk Factors:  Diabetes mellitus and male gender  Cath/PCI:  None  CV Surgery:  None  EP Procedures and Devices:  None  Non-Invasive Evaluation(s):  None  Recent CV Pertinent Labs: Lab Results  Component Value Date   CHOL 136 06/27/2016   HDL 31 (L) 06/27/2016   LDLCALC 74 06/27/2016   TRIG 155 (H) 06/27/2016   CHOLHDL 4.4 06/27/2016   K 4.8 06/27/2016   K 4.0 01/26/2014   BUN 21 06/27/2016   BUN 9 01/26/2014   CREATININE 0.98 06/27/2016   CREATININE 0.76 01/26/2014    --------------------------------------------------------------------------------------------------  Past Medical History:  Diagnosis Date  . Allergy   . Diabetes mellitus without complication ([place])   . Hypothyroidism   . Kidney stones     Past Surgical History:  Procedure Laterality Date  . NO PAST SURGERIES      Outpatient Encounter Prescriptions as of 07/02/2016  Medication Sig  . cyanocobalamin (,VITAMIN B-12,) 1000 MCG/ML injection Inject 1 mL (1,000 mcg total) into the muscle every 30 (thirty) days. Inject once a week for 6 weeks then once a month thereafter  . gabapentin (NEURONTIN) 300 MG capsule Take 2 capsules by mouth 3  times daily  . glucose blood (FREESTYLE LITE) test strip Check blood sugars twice a day (Dx. 250.02)  . levothyroxine (SYNTHROID, LEVOTHROID) 75 MCG tablet TAKE 1 TABLET BY MOUTH  DAILY  . lisinopril (PRINIVIL,ZESTRIL) 20 MG tablet TAKE  1 TABLET BY MOUTH  DAILY  . metFORMIN (GLUCOPHAGE) 1000 MG tablet Take 1 tablet (1,000 mg total) by mouth 2 (two) times daily with a meal.  . SYRINGE-NEEDLE, DISP, 3 ML 25G X 1" 3 ML MISC Use to administer weekly b12 injections, then month after 6 weeks. thanks  .  tadalafil (CIALIS) 10 MG tablet Take as directed.   No facility-administered encounter medications on file as of 07/02/2016.     Allergies: Patient has no known allergies.  Social History   Social History  . Marital status: Married    Spouse name: N/A  . Number of children: N/A  . Years of education: N/A   Occupational History  . Not on file.   Social History Main Topics  . Smoking status: Never Smoker  . Smokeless tobacco: Never Used  . Alcohol use 0.0 oz/week     Comment: very rarely (1-2/year)  . Drug use: No  . Sexual activity: Not on file   Other Topics Concern  . Not on file   Social History Narrative  . No narrative on file    Family History  Problem Relation Age of Onset  . Breast cancer Mother   . Diabetes Father   . Diabetes Sister   . Arthritis Maternal Grandmother   . Diabetes Maternal Grandmother     Review of Systems: A 12-system review of systems was performed and was negative except as noted in the HPI.  --------------------------------------------------------------------------------------------------  Physical Exam: BP 127/84 (BP Location: Right Arm, Patient Position: Sitting, Cuff Size: Normal)   Pulse 83   Ht 5' 11.5" (1.816 m)   Wt 249 lb 4 oz (113.1 kg)   BMI 34.28 kg/m    Position Blood pressure (mmHg) Heart rate (bpm)  Lying 131/87 80  Sitting 125/84 86  Standing 109/74 93  Standing (3 minutes) 100/69 93   General:  Obese man, seated comfortably in the exam room. He is accompanied by his wife. HEENT: No conjunctival pallor or scleral icterus.  Moist mucous membranes.  OP clear. Neck: Supple without lymphadenopathy, thyromegaly, JVD, or HJR.  No carotid bruit. Lungs: Normal work of breathing.  Clear to auscultation bilaterally without wheezes or crackles. Heart: Regular rate and rhythm without murmurs, rubs, or gallops.  Non-displaced PMI. Abd: Bowel sounds present.  Soft, NT/ND without hepatosplenomegaly Ext: No lower extremity  edema.  Radial, PT, and DP pulses are 2+ bilaterally Skin: warm and dry without rash Neuro: CNIII-XII intact.  Strength and fine-touch sensation intact in upper and lower extremities bilaterally. Psych: Normal mood and affect.  EKG:  Normal sinus rhythm with left axis deviation and somewhat delayed R-wave transition, which may reflect lead placement and is similar to previous tracings.  Lab Results  Component Value Date   WBC 5.2 06/27/2016   HGB 16.7 01/26/2014   HCT 43.7 06/27/2016   MCV 85 06/27/2016   PLT 232 06/27/2016    Lab Results  Component Value Date   NA 138 06/27/2016   K 4.8 06/27/2016   CL 99 06/27/2016   CO2 22 06/27/2016   BUN 21 06/27/2016   CREATININE 0.98 06/27/2016   GLUCOSE 126 (H) 06/27/2016   ALT 13 06/27/2016    Lab Results  Component Value Date   CHOL 136 06/27/2016   HDL 31 (L) 06/27/2016   LDLCALC 74 06/27/2016   TRIG 155 (H) 06/27/2016   CHOLHDL 4.4 06/27/2016    --------------------------------------------------------------------------------------------------  ASSESSMENT AND PLAN: Lightheadedness and orthostatic hypotension Patient reports exertional lightheadedness  and demonstrates positive orthostatic vital signs with a systolic pressure drop of 31 mmHg from lying to standing. His exam is otherwise unremarkable. EKG today demonstrates sinus rhythm with left axis deviation and delayed R-wave progression, similar to prior tracings. Given his history of uncontrolled diabetes that remains suboptimally controlled, autonomic neuropathy is a consideration for his symptoms. Vasovagal seems less likely given that symptoms typically occur with exertion. Arrhythmogenic cause is also possibility given the patient's report of palpitations. We have agreed to proceed with transthoracic echocardiogram to evaluate for significant structural abnormalities. If this is unrevealing, we will proceed with exercise tolerance test and event monitor. In the meantime, we  will decrease lisinopril from 20 mg daily to 10 mg daily. The patient was instructed to measure his blood pressure at home and to contact us if it is consistently above 140/90. I have also advised Mr. Kellman to increase his fluid intake so as not to become dehydrated.  Diabetes mellitus Diabetes remains uncontrolled. I'm concerned that some of his symptoms may be related to autonomic neuropathy from his diabetes. As such, I have instructed him to continue with his lifestyle modifications but to also speak with his PCP about initiation of medical therapy, which will likely be necessary for him to achieve a sustained hemoglobin A1c below 7.  Hypertension As above, the patient demonstrated orthostatic hypotension today, likely contributing to his dizziness.  While we are further evaluating potential etiologies, we have agreed to decrease lisinopril to 10 mg daily.  Follow-up: Return to clinic in 3 months.  Nelva Bush, MD 07/02/2016 8:35 AM

## 2016-07-02 NOTE — Telephone Encounter (Signed)
Patient notified and rx sent to [facility] per patient request, patient states the cardiologist decreased his lisinopril by half

## 2016-07-02 NOTE — Telephone Encounter (Signed)
Order placed for f/u tsh to be drawn at [facility].

## 2016-07-02 NOTE — Telephone Encounter (Signed)
Pt called returning your call. Pt heading into another appt will be unavailable for 1 hour. Thank you!  Call pt @[phone]

## 2016-07-02 NOTE — Patient Instructions (Addendum)
Medication Instructions:  Your physician has recommended you make the following change in your medication:  DECREASE lisinopril to 10mg  once daily   Labwork: none  Testing/Procedures: Your physician has requested that you have an echocardiogram. Echocardiography is a painless test that uses sound waves to create images of your heart. It provides your doctor with information about the size and shape of your heart and how well your heart's chambers and valves are working. This procedure takes approximately one hour. There are no restrictions for this procedure.    Follow-Up: Your physician recommends that you schedule a follow-up appointment in: 3 months with Dr. Saunders Revel.    Any Other Special Instructions Will Be Listed Below (If Applicable). Please increase fluids/water intake.     If you need a refill on your cardiac medications before your next appointment, please call your pharmacy.  Echocardiogram An echocardiogram, or echocardiography, uses sound waves (ultrasound) to produce an image of your heart. The echocardiogram is simple, painless, obtained within a short period of time, and offers valuable information to your health care provider. The images from an echocardiogram can provide information such as:  Evidence of coronary artery disease (CAD).  Heart size.  Heart muscle function.  Heart valve function.  Aneurysm detection.  Evidence of a past heart attack.  Fluid buildup around the heart.  Heart muscle thickening.  Assess heart valve function. LET [facility] CARE PROVIDER KNOW ABOUT:  Any allergies you have.  All medicines you are taking, including vitamins, herbs, eye drops, creams, and over-the-counter medicines.  Previous problems you or members of your family have had with the use of anesthetics.  Any blood disorders you have.  Previous surgeries you have had.  Medical conditions you have.  Possibility of pregnancy, if this applies. BEFORE THE  PROCEDURE  No special preparation is needed. Eat and drink normally.  PROCEDURE   In order to produce an image of your heart, gel will be applied to your chest and a wand-like tool (transducer) will be moved over your chest. The gel will help transmit the sound waves from the transducer. The sound waves will harmlessly bounce off your heart to allow the heart images to be captured in real-time motion. These images will then be recorded.  You may need an IV to receive a medicine that improves the quality of the pictures. AFTER THE PROCEDURE You may return to your normal schedule including diet, activities, and medicines, unless your health care provider tells you otherwise.   This information is not intended to replace advice given to you by your health care provider. Make sure you discuss any questions you have with your health care provider.   Document Released: 08/09/2000 Document Revised: 09/02/2014 Document Reviewed: 04/19/2013 Elsevier Interactive Patient Education [employer].

## 2016-07-02 NOTE — Telephone Encounter (Signed)
-----   Message from [name], MD sent at 07/02/2016  3:52 AM EST ----- Please call and notify pt that his a1c is still elevated, but has improved some from the previous check.  Needs to check and record blood sugars and send in over the next two weeks.  Also, his tsh is still slightly elevated.  Confirm that he is on synthroid 35mcg q day.  If so, then increase to 19mcg q day.  Will need f/u tsh in 6 weeks.  I will order the f/u lab to be drawn at [facility].  Cholesterol is ok.  Good cholesterol slightly decreased.  Exercise will increase the good cholesterol.   Hgb, kidney function tests and liver function tests are wnl.

## 2016-07-04 ENCOUNTER — Ambulatory Visit: Payer: 59 | Admitting: Internal Medicine

## 2016-07-22 ENCOUNTER — Ambulatory Visit: Payer: 59 | Admitting: Internal Medicine

## 2016-07-29 ENCOUNTER — Other Ambulatory Visit: Payer: Self-pay

## 2016-07-29 ENCOUNTER — Ambulatory Visit (INDEPENDENT_AMBULATORY_CARE_PROVIDER_SITE_OTHER): Payer: 59

## 2016-07-29 DIAGNOSIS — R55 Syncope and collapse: Secondary | ICD-10-CM

## 2016-07-30 ENCOUNTER — Telehealth: Payer: Self-pay | Admitting: *Deleted

## 2016-07-30 DIAGNOSIS — R42 Dizziness and giddiness: Secondary | ICD-10-CM

## 2016-07-30 NOTE — Telephone Encounter (Signed)
No answer. Left message to call back.   

## 2016-07-30 NOTE — Telephone Encounter (Signed)
Received incoming call from patient. Results given and recommendations for ETT and Ziopatch discussed. He verbalized understanding. Patient said he will be out of town next week and this week is very busy. Patient will call back tomorrow to schedule these. I will also send a message to scheduling as well.

## 2016-07-30 NOTE — Telephone Encounter (Signed)
-----   Message from Nelva Bush, MD sent at 07/30/2016  7:46 AM EST ----- Please let Gregory Crane know that his echocardiogram is normal.  Please schedule him for a treadmill exercise tolerance test, as well as a 14 day Ziopatch to be placed after he has completed the treadmill test to evaluate his lightheadedness.  Thanks.

## 2016-07-30 NOTE — Telephone Encounter (Signed)
No answer. Left detail message with results, ok per DPR, and to call back to schedule additional testing.

## 2016-08-13 ENCOUNTER — Encounter: Payer: Self-pay | Admitting: Internal Medicine

## 2016-08-15 ENCOUNTER — Ambulatory Visit (INDEPENDENT_AMBULATORY_CARE_PROVIDER_SITE_OTHER): Payer: 59

## 2016-08-15 ENCOUNTER — Ambulatory Visit: Payer: 59

## 2016-08-15 DIAGNOSIS — R42 Dizziness and giddiness: Secondary | ICD-10-CM

## 2016-08-18 LAB — EXERCISE TOLERANCE TEST
Estimated workload: 7.2 METS
Exercise duration (min): 6 min
Exercise duration (sec): 7 s
MPHR: 174 {beats}/min
Peak HR: 150 {beats}/min
Percent HR: 86 %
Rest HR: 94 {beats}/min

## 2016-08-20 ENCOUNTER — Other Ambulatory Visit: Payer: Self-pay | Admitting: Internal Medicine

## 2016-08-23 ENCOUNTER — Other Ambulatory Visit: Payer: Self-pay | Admitting: Internal Medicine

## 2016-08-27 ENCOUNTER — Other Ambulatory Visit: Payer: Self-pay | Admitting: Internal Medicine

## 2016-08-29 MED ORDER — GABAPENTIN 300 MG PO CAPS: ORAL_CAPSULE | ORAL | 0 refills | Status: DC

## 2016-08-29 NOTE — Telephone Encounter (Signed)
Refill request for Gabapentin, last seen 27OCT2017, last filled 27OCT2017.  Please advise.

## 2016-08-29 NOTE — Telephone Encounter (Signed)
rx sent in for two weeks of gabapentin to local pharmacy.  Please notify pt.  Thanks

## 2016-09-05 ENCOUNTER — Ambulatory Visit (INDEPENDENT_AMBULATORY_CARE_PROVIDER_SITE_OTHER): Payer: 59 | Admitting: Internal Medicine

## 2016-09-05 ENCOUNTER — Encounter: Payer: Self-pay | Admitting: Internal Medicine

## 2016-09-05 DIAGNOSIS — R55 Syncope and collapse: Secondary | ICD-10-CM

## 2016-09-05 DIAGNOSIS — G629 Polyneuropathy, unspecified: Secondary | ICD-10-CM

## 2016-09-05 DIAGNOSIS — E039 Hypothyroidism, unspecified: Secondary | ICD-10-CM | POA: Diagnosis not present

## 2016-09-05 DIAGNOSIS — N529 Male erectile dysfunction, unspecified: Secondary | ICD-10-CM

## 2016-09-05 DIAGNOSIS — E785 Hyperlipidemia, unspecified: Secondary | ICD-10-CM

## 2016-09-05 DIAGNOSIS — R809 Proteinuria, unspecified: Secondary | ICD-10-CM

## 2016-09-05 DIAGNOSIS — E11319 Type 2 diabetes mellitus with unspecified diabetic retinopathy without macular edema: Secondary | ICD-10-CM

## 2016-09-05 DIAGNOSIS — I10 Essential (primary) hypertension: Secondary | ICD-10-CM

## 2016-09-05 MED ORDER — PREGABALIN 50 MG PO CAPS: 50.0000 mg | ORAL_CAPSULE | Freq: Two times a day (BID) | ORAL | 1 refills | Status: DC

## 2016-09-05 NOTE — Progress Notes (Signed)
Pre visit review using our clinic review tool, if applicable. No additional management support is needed unless otherwise documented below in the visit note. 

## 2016-09-05 NOTE — Progress Notes (Signed)
Patient ID: Gregory Crane, male   DOB: 1970-09-30, 46 y.o.   MRN: [id number]   Subjective:    Patient ID: Gregory Crane, male    DOB: 11-10-1970, 46 y.o.   MRN: [id number]  HPI  Patient here for a scheduled follow up.  He saw cardiology.  Had normal echo and ETT ok.  No chest pain. No sob.  Light headedness has improved.  No acid reflux.  No nausea.  No vomiting.  Bowels stable.  States he is now watching his diet.  Sugars are better.  States am sugars averaging 120-130.  Having problems with erectile dysfunction.  Tried cialis.  Did not help.  Request referral to urology.  Having increased burning in his feet.  Gabapentin not helping.  Affects his sleep at times.  Increased pain.     Past Medical History:  Diagnosis Date  . Allergy   . Diabetes mellitus without complication ([place])   . Hypertension   . Hypothyroidism   . Kidney stones    Past Surgical History:  Procedure Laterality Date  . LUMBAR PUNCTURE     as child  . NO PAST SURGERIES     Family History  Problem Relation Age of Onset  . Breast cancer Mother   . Diabetes Father   . Diabetes Sister   . Arthritis Maternal Grandmother   . Diabetes Maternal Grandmother    Social History   Social History  . Marital status: Married    Spouse name: N/A  . Number of children: N/A  . Years of education: N/A   Social History Main Topics  . Smoking status: Never Smoker  . Smokeless tobacco: Never Used  . Alcohol use No     Comment: very rarely (1-2/year)  . Drug use: No  . Sexual activity: Not Asked   Other Topics Concern  . None   Social History Narrative  . None    Outpatient Encounter Prescriptions as of 09/05/2016  Medication Sig  . FIBER PO Take by mouth as needed.  . gabapentin (NEURONTIN) 300 MG capsule Take 2 capsules by mouth 3  times daily  . glucose blood (FREESTYLE LITE) test strip Check blood sugars twice a day (Dx. 250.02)  . levothyroxine (SYNTHROID, LEVOTHROID) 100 MCG tablet Take 1 tablet (100 mcg  total) by mouth daily. Must keep appt on 09/05/16 for additional refills  . lisinopril (PRINIVIL,ZESTRIL) 10 MG tablet TAKE 1 TABLET BY MOUTH  DAILY  . SYRINGE-NEEDLE, DISP, 3 ML 25G X 1" 3 ML MISC Use to administer weekly b12 injections, then month after 6 weeks. thanks  . tadalafil (CIALIS) 10 MG tablet Take as directed.  . pregabalin (LYRICA) 50 MG capsule Take 1 capsule (50 mg total) by mouth 2 (two) times daily.   No facility-administered encounter medications on file as of 09/05/2016.     Review of Systems  Constitutional: Negative for appetite change and unexpected weight change.  HENT: Negative for congestion and sinus pressure.   Respiratory: Negative for cough, chest tightness and shortness of breath.   Cardiovascular: Negative for chest pain, palpitations and leg swelling.  Gastrointestinal: Negative for abdominal pain, diarrhea, nausea and vomiting.  Genitourinary: Negative for difficulty urinating and dysuria.       Erectile dysfunction as outlined.    Musculoskeletal: Negative for back pain and joint swelling.  Skin: Negative for color change and rash.  Neurological: Negative for dizziness, light-headedness and headaches.       Feet burning as outlined.  Psychiatric/Behavioral: Negative for agitation and dysphoric mood.       Objective:    Physical Exam  Constitutional: He appears well-developed and well-nourished. No distress.  HENT:  Nose: Nose normal.  Mouth/Throat: Oropharynx is clear and moist.  Neck: Neck supple. No thyromegaly present.  Cardiovascular: Normal rate and regular rhythm.   Pulmonary/Chest: Effort normal and breath sounds normal. No respiratory distress.  Abdominal: Soft. Bowel sounds are normal. There is no tenderness.  Musculoskeletal: He exhibits no edema or tenderness.  Lymphadenopathy:    He has no cervical adenopathy.  Neurological:  Feet hypersensitive to touch.   Skin: No rash noted. No erythema.  Psychiatric: He has a normal mood and  affect. His behavior is normal.    BP 110/68   Pulse 85   Temp 97.4 F (36.3 C) (Oral)   Ht 6' (1.829 m)   Wt 251 lb 6.4 oz (114 kg)   SpO2 96%   BMI 34.10 kg/m  Wt Readings from Last 3 Encounters:  09/05/16 251 lb 6.4 oz (114 kg)  07/02/16 249 lb 4 oz (113.1 kg)  06/21/16 252 lb (114.3 kg)     Lab Results  Component Value Date   WBC 5.2 06/27/2016   HGB 16.7 01/26/2014   HCT 43.7 06/27/2016   PLT 232 06/27/2016   GLUCOSE 126 (H) 06/27/2016   CHOL 136 06/27/2016   TRIG 155 (H) 06/27/2016   HDL 31 (L) 06/27/2016   LDLCALC 74 06/27/2016   ALT 13 06/27/2016   AST 19 06/27/2016   NA 138 06/27/2016   K 4.8 06/27/2016   CL 99 06/27/2016   CREATININE 0.98 06/27/2016   BUN 21 06/27/2016   CO2 22 06/27/2016   TSH 7.850 (H) 06/27/2016   HGBA1C 9.3 (H) 06/27/2016       Assessment & Plan:   Problem List Items Addressed This Visit    Diabetes ([place])    He is not taking any diabetic medication now.  Refuses at this time.  Has adjusted his diet.  States sugars are now averaging 120-130.  Follow.  Low carb diet and exercise.        Relevant Orders   Hemoglobin A1c   Erectile dysfunction    Gave him a trial of cialis.  Did not help.  Request referral to urology.        Relevant Orders   Ambulatory referral to Urology   Essential hypertension    Blood pressure under good control.  Continue same medication regimen.  Follow pressures.  Follow metabolic panel.        Relevant Orders   CBC with Differential/Platelet   Basic metabolic panel   Hyperlipidemia    Low cholesterol diet and exercise.  Follow lipid panel.        Relevant Orders   Hepatic function panel   Lipid panel   Hypothyroidism    On thyroid replacement.  Follow tsh.        Relevant Orders   TSH   Microalbuminuria    Continue lisinopril.        Near syncope    Had cardiac w/up.  Negative.  Wanted to hold on holter monitor.  Since decreasing lisinopril, improved.  Follow.  Desires no further  w/up at at this time.        Neuropathy (HCC)    On gabapentin.  Not helping.  Discussed treatment options.  Change to lyrica.  Start with 50mg  bid.  Titrate up if needed.  Gregory Pheasant, MD

## 2016-09-08 ENCOUNTER — Encounter: Payer: Self-pay | Admitting: Internal Medicine

## 2016-09-08 NOTE — Assessment & Plan Note (Signed)
Continue lisinopril

## 2016-09-08 NOTE — Assessment & Plan Note (Signed)
Gave him a trial of cialis.  Did not help.  Request referral to urology.

## 2016-09-08 NOTE — Assessment & Plan Note (Signed)
Blood pressure under good control.  Continue same medication regimen.  Follow pressures.  Follow metabolic panel.   

## 2016-09-08 NOTE — Assessment & Plan Note (Signed)
He is not taking any diabetic medication now.  Refuses at this time.  Has adjusted his diet.  States sugars are now averaging 120-130.  Follow.  Low carb diet and exercise.

## 2016-09-08 NOTE — Assessment & Plan Note (Signed)
Low cholesterol diet and exercise.  Follow lipid panel.   

## 2016-09-08 NOTE — Assessment & Plan Note (Signed)
On gabapentin.  Not helping.  Discussed treatment options.  Change to lyrica.  Start with 50mg  bid.  Titrate up if needed.

## 2016-09-08 NOTE — Assessment & Plan Note (Signed)
On thyroid replacement.  Follow tsh.  

## 2016-09-08 NOTE — Assessment & Plan Note (Signed)
Had cardiac w/up.  Negative.  Wanted to hold on holter monitor.  Since decreasing lisinopril, improved.  Follow.  Desires no further w/up at at this time.

## 2016-09-10 ENCOUNTER — Encounter: Payer: Self-pay | Admitting: Internal Medicine

## 2016-09-10 DIAGNOSIS — G629 Polyneuropathy, unspecified: Secondary | ICD-10-CM

## 2016-09-16 MED ORDER — PREGABALIN 50 MG PO CAPS: ORAL_CAPSULE | ORAL | 1 refills | Status: DC

## 2016-09-16 NOTE — Telephone Encounter (Signed)
rx sent in for lyrica 50mg  (2 tablets bid) #120 with one refill.   Also order placed for neurology referral.

## 2016-09-23 ENCOUNTER — Other Ambulatory Visit: Payer: Self-pay | Admitting: Internal Medicine

## 2016-09-23 ENCOUNTER — Telehealth: Payer: Self-pay | Admitting: Internal Medicine

## 2016-09-23 MED ORDER — PREGABALIN 100 MG PO CAPS: 100.0000 mg | ORAL_CAPSULE | Freq: Two times a day (BID) | ORAL | 1 refills | Status: DC

## 2016-09-23 NOTE — Telephone Encounter (Signed)
The patient's prescription was sent to the pharmacy for Lyrica today.Per the pharmacist the patient's insurance will only pay for 3 capsules a day. The patient is taking 2 50 mg tabs in the am and 2-50 mg tabs in the pm . The pharmacist is asking that the prescription be written for 1 -100 mg tab in the am and 1-100 mg tab in the pm. The patient will need this called to the pharmacy today he is out of medication.

## 2016-09-23 NOTE — Progress Notes (Signed)
rx for lyrica 100mg  #60 with 1 refill.

## 2016-09-23 NOTE — Telephone Encounter (Signed)
Faxed to pharmacy

## 2016-09-23 NOTE — Telephone Encounter (Signed)
Ok.  rx in your box.  Thanks.

## 2016-09-23 NOTE — Telephone Encounter (Signed)
I have ok'd rx for lyrica 100mg  #60 with one refill.  See my chart message.  Please make sure to cancel the other rx.  Thanks

## 2016-09-23 NOTE — Telephone Encounter (Signed)
Please advise on if ok to change

## 2016-09-25 NOTE — Telephone Encounter (Addendum)
Spoke with Melissa at [facility] and she deactivated the Lyrica 50 mg scripts on file.

## 2016-10-01 ENCOUNTER — Ambulatory Visit: Payer: 59 | Admitting: Internal Medicine

## 2016-10-02 ENCOUNTER — Encounter: Payer: Self-pay | Admitting: Internal Medicine

## 2016-10-03 ENCOUNTER — Encounter: Payer: Self-pay | Admitting: Internal Medicine

## 2016-10-03 NOTE — Telephone Encounter (Signed)
I have written a letter.  Can either fax to him or he can come in and pick up the letter.

## 2016-10-15 ENCOUNTER — Other Ambulatory Visit: Payer: Self-pay | Admitting: Internal Medicine

## 2016-10-20 ENCOUNTER — Observation Stay: Payer: 59

## 2016-10-20 ENCOUNTER — Observation Stay
Admission: EM | Admit: 2016-10-20 | Discharge: 2016-10-21 | Disposition: A | Discharge status: Home / Self Care | Payer: 59 | Attending: Specialist | Admitting: Specialist

## 2016-10-20 ENCOUNTER — Emergency Department: Payer: 59

## 2016-10-20 ENCOUNTER — Encounter: Payer: Self-pay | Admitting: Emergency Medicine

## 2016-10-20 DIAGNOSIS — Z833 Family history of diabetes mellitus: Secondary | ICD-10-CM | POA: Insufficient documentation

## 2016-10-20 DIAGNOSIS — Z803 Family history of malignant neoplasm of breast: Secondary | ICD-10-CM | POA: Diagnosis not present

## 2016-10-20 DIAGNOSIS — E039 Hypothyroidism, unspecified: Secondary | ICD-10-CM | POA: Diagnosis not present

## 2016-10-20 DIAGNOSIS — G510 Bell's palsy: Secondary | ICD-10-CM | POA: Insufficient documentation

## 2016-10-20 DIAGNOSIS — E1149 Type 2 diabetes mellitus with other diabetic neurological complication: Secondary | ICD-10-CM | POA: Diagnosis present

## 2016-10-20 DIAGNOSIS — E669 Obesity, unspecified: Secondary | ICD-10-CM | POA: Diagnosis not present

## 2016-10-20 DIAGNOSIS — Z6835 Body mass index (BMI) 35.0-35.9, adult: Secondary | ICD-10-CM | POA: Insufficient documentation

## 2016-10-20 DIAGNOSIS — Z87442 Personal history of urinary calculi: Secondary | ICD-10-CM | POA: Insufficient documentation

## 2016-10-20 DIAGNOSIS — G459 Transient cerebral ischemic attack, unspecified: Secondary | ICD-10-CM | POA: Diagnosis present

## 2016-10-20 DIAGNOSIS — E1142 Type 2 diabetes mellitus with diabetic polyneuropathy: Secondary | ICD-10-CM | POA: Insufficient documentation

## 2016-10-20 DIAGNOSIS — Z79899 Other long term (current) drug therapy: Secondary | ICD-10-CM | POA: Insufficient documentation

## 2016-10-20 DIAGNOSIS — Z8261 Family history of arthritis: Secondary | ICD-10-CM | POA: Insufficient documentation

## 2016-10-20 DIAGNOSIS — I10 Essential (primary) hypertension: Secondary | ICD-10-CM | POA: Diagnosis present

## 2016-10-20 LAB — COMPREHENSIVE METABOLIC PANEL
ALT: 14 U/L — ABNORMAL LOW (ref 17–63)
AST: 18 U/L (ref 15–41)
Albumin: 4 g/dL (ref 3.5–5.0)
Alkaline Phosphatase: 67 U/L (ref 38–126)
Anion gap: 8 (ref 5–15)
BUN: 15 mg/dL (ref 6–20)
CO2: 27 mmol/L (ref 22–32)
Calcium: 8.9 mg/dL (ref 8.9–10.3)
Chloride: 103 mmol/L (ref 101–111)
Creatinine, Ser: 1.03 mg/dL (ref 0.61–1.24)
GFR calc Af Amer: >60 mL/min (ref >60)
GFR calc non Af Amer: >60 mL/min (ref >60)
Glucose, Bld: 265 mg/dL — ABNORMAL HIGH (ref 65–99)
Potassium: 3.8 mmol/L (ref 3.5–5.1)
Sodium: 138 mmol/L (ref 135–145)
Total Bilirubin: 0.6 mg/dL (ref 0.3–1.2)
Total Protein: 7.6 g/dL (ref 6.5–8.1)

## 2016-10-20 LAB — DIFFERENTIAL
Basophils Absolute: 0 10*3/uL (ref 0–0.1)
Basophils Relative: 1 %
Eosinophils Absolute: 0.1 10*3/uL (ref 0–0.7)
Eosinophils Relative: 1 %
Lymphocytes Relative: 31 %
Lymphs Abs: 1.9 10*3/uL (ref 1.0–3.6)
Monocytes Absolute: 0.5 10*3/uL (ref 0.2–1.0)
Monocytes Relative: 8 %
Neutro Abs: 3.6 10*3/uL (ref 1.4–6.5)
Neutrophils Relative %: 59 %

## 2016-10-20 LAB — CBC
HCT: 46.3 % (ref 40.0–52.0)
Hemoglobin: 15.9 g/dL (ref 13.0–18.0)
MCH: 29.7 pg (ref 26.0–34.0)
MCHC: 34.4 g/dL (ref 32.0–36.0)
MCV: 86.3 fL (ref 80.0–100.0)
Platelets: 205 10*3/uL (ref 150–440)
RBC: 5.37 MIL/uL (ref 4.40–5.90)
RDW: 12 % (ref 11.5–14.5)
WBC: 6.1 10*3/uL (ref 3.8–10.6)

## 2016-10-20 LAB — LIPID PANEL
Cholesterol: 175 mg/dL (ref 0–200)
HDL: 33 mg/dL — ABNORMAL LOW (ref >40)
LDL Cholesterol: UNDETERMINED mg/dL (ref 0–99)
Total CHOL/HDL Ratio: 5.3 RATIO
Triglycerides: 420 mg/dL — ABNORMAL HIGH (ref <150)
VLDL: UNDETERMINED mg/dL (ref 0–40)

## 2016-10-20 LAB — PROTIME-INR
INR: 0.95
Prothrombin Time: 12.7 seconds (ref 11.4–15.2)

## 2016-10-20 LAB — GLUCOSE, CAPILLARY
Glucose-Capillary: 293 mg/dL — ABNORMAL HIGH (ref 65–99)
Glucose-Capillary: 232 mg/dL — ABNORMAL HIGH (ref 65–99)

## 2016-10-20 LAB — APTT: aPTT: 31 seconds (ref 24–36)

## 2016-10-20 LAB — TROPONIN I: Troponin I: <0.03 ng/mL (ref <0.03)

## 2016-10-20 LAB — TSH: TSH: 7.53 u[IU]/mL — ABNORMAL HIGH (ref 0.350–4.500)

## 2016-10-20 MED ORDER — LEVOTHYROXINE SODIUM 100 MCG PO TABS
100.0000 ug | ORAL_TABLET | Freq: Every day | ORAL | Status: DC
  Administered 2016-10-21: 100 ug via ORAL
  Filled 2016-10-20: qty 1

## 2016-10-20 MED ORDER — PREGABALIN 50 MG PO CAPS
100.0000 mg | ORAL_CAPSULE | Freq: Three times a day (TID) | ORAL | Status: DC
  Administered 2016-10-20 – 2016-10-21 (×2): 100 mg via ORAL
  Filled 2016-10-20 (×2): qty 2

## 2016-10-20 MED ORDER — ATORVASTATIN CALCIUM 20 MG PO TABS: 40.0000 mg | ORAL_TABLET | Freq: Every day | ORAL | Status: DC

## 2016-10-20 MED ORDER — ASPIRIN EC 81 MG PO TBEC
81.0000 mg | DELAYED_RELEASE_TABLET | Freq: Every day | ORAL | Status: DC
  Administered 2016-10-21: 81 mg via ORAL
  Filled 2016-10-20: qty 1

## 2016-10-20 MED ORDER — INSULIN ASPART 100 UNIT/ML ~~LOC~~ SOLN
0.0000 [IU] | Freq: Three times a day (TID) | SUBCUTANEOUS | Status: DC
  Administered 2016-10-21: 3 [IU] via SUBCUTANEOUS
  Filled 2016-10-20: qty 3

## 2016-10-20 MED ORDER — SODIUM CHLORIDE 0.9% FLUSH
3.0000 mL | Freq: Two times a day (BID) | INTRAVENOUS | Status: DC
  Administered 2016-10-20 – 2016-10-21 (×2): 3 mL via INTRAVENOUS

## 2016-10-20 MED ORDER — ENOXAPARIN SODIUM 40 MG/0.4ML ~~LOC~~ SOLN
40.0000 mg | SUBCUTANEOUS | Status: DC
  Administered 2016-10-20: 22:00:00 40 mg via SUBCUTANEOUS
  Filled 2016-10-20: qty 0.4

## 2016-10-20 MED ORDER — ONDANSETRON HCL 4 MG/2ML IJ SOLN: 4.0000 mg | Freq: Four times a day (QID) | INTRAMUSCULAR | Status: DC | PRN

## 2016-10-20 MED ORDER — ASPIRIN 81 MG PO CHEW
324.0000 mg | CHEWABLE_TABLET | Freq: Once | ORAL | Status: AC
  Administered 2016-10-20: 324 mg via ORAL
  Filled 2016-10-20: qty 4

## 2016-10-20 MED ORDER — INSULIN ASPART 100 UNIT/ML ~~LOC~~ SOLN
0.0000 [IU] | Freq: Every day | SUBCUTANEOUS | Status: DC
  Administered 2016-10-20: 2 [IU] via SUBCUTANEOUS
  Filled 2016-10-20: qty 2

## 2016-10-20 MED ORDER — ONDANSETRON HCL 4 MG PO TABS: 4.0000 mg | ORAL_TABLET | Freq: Four times a day (QID) | ORAL | Status: DC | PRN

## 2016-10-20 MED ORDER — ACETAMINOPHEN 650 MG RE SUPP: 650.0000 mg | Freq: Four times a day (QID) | RECTAL | Status: DC | PRN

## 2016-10-20 MED ORDER — LISINOPRIL 10 MG PO TABS
10.0000 mg | ORAL_TABLET | Freq: Every day | ORAL | Status: DC
  Administered 2016-10-21: 08:00:00 10 mg via ORAL
  Filled 2016-10-20: qty 1

## 2016-10-20 MED ORDER — ACETAMINOPHEN 325 MG PO TABS: 650.0000 mg | ORAL_TABLET | Freq: Four times a day (QID) | ORAL | Status: DC | PRN

## 2016-10-20 NOTE — ED Triage Notes (Signed)
Pt was with wife and noticed right face started drooping at 1655.  Pt has decreased sensation to right face. No other defects. Speech clear.

## 2016-10-20 NOTE — ED Notes (Signed)
Pt returned to room from MRI. Awaiting admission bed to be accepted.

## 2016-10-20 NOTE — ED Notes (Signed)
CODE STROKE CALLED TO [facility] AT [facility]

## 2016-10-20 NOTE — H&P (Signed)
[facility] [place] NAME: Gregory Crane    MR#:  [id number]  DATE OF BIRTH:  [birth year]  DATE OF ADMISSION:  10/20/2016  PRIMARY CARE PHYSICIAN: [name], MD   REQUESTING/REFERRING PHYSICIAN: Dr. Darel Hong  CHIEF COMPLAINT:   Chief Complaint  Patient presents with  . Facial Droop  . Numbness    HISTORY OF PRESENT ILLNESS:  Gregory Crane  is a 46 y.o. male with a known history of Obesity, hypertension, diet-controlled diabetes mellitus, hypothyroidism presents to hospital secondary to onset of right facial numbness associated with right facial droop and inability to close his right eye completely. Patient just saw a neurologist 2 days ago for his peripheral neuropathy and was advised to increase his Lyrica dose. He hasn't done that yet already. He also had a recent stress test that was done for evaluation of lightheadedness which was normal last month. His blood pressure medication dose was decreased and his lightheadedness has resolved. Today he was eating supper with his wife and all of a sudden his wife noted that the right side of his mouth was droopy and he is not able to close his right eye completely. His speech was clear, he did not have any trouble swallowing or expressing his words. Denies any other motor or sensory deficits. On his way to the emergency room he felt that the right side of his face was numb and heavy. Though he can feel the touch he feels it to be heavy. His symptoms have resolved by the time he came to the emergency room. Patient does not take any aspirin at home. CT of the head was negative here. TPA was not given due to resolution of his symptoms.  PAST MEDICAL HISTORY:   Past Medical History:  Diagnosis Date  . Allergy   . Diabetes mellitus without complication (HCC)    diet controlled  . Hypertension   . Hypothyroidism   . Kidney stones     PAST SURGICAL HISTORY:   Past Surgical History:  Procedure  Laterality Date  . LUMBAR PUNCTURE     as child  . NO PAST SURGERIES      SOCIAL HISTORY:   Social History  Substance Use Topics  . Smoking status: Never Smoker  . Smokeless tobacco: Never Used  . Alcohol use No     Comment: very rarely (1-2/year)    FAMILY HISTORY:   Family History  Problem Relation Age of Onset  . Breast cancer Mother   . Diabetes Father   . Diabetes Sister   . Arthritis Maternal Grandmother   . Diabetes Maternal Grandmother     DRUG ALLERGIES:  No Known Allergies  REVIEW OF SYSTEMS:   Review of Systems  Constitutional: Negative for chills, fever, malaise/fatigue and weight loss.  HENT: Negative for ear discharge, ear pain, hearing loss and nosebleeds.   Eyes: Negative for blurred vision, double vision and photophobia.  Respiratory: Negative for cough, hemoptysis, shortness of breath and wheezing.   Cardiovascular: Negative for chest pain, palpitations, orthopnea and leg swelling.  Gastrointestinal: Negative for abdominal pain, constipation, diarrhea, heartburn, melena, nausea and vomiting.  Genitourinary: Negative for dysuria and urgency.  Musculoskeletal: Negative for back pain, myalgias and neck pain.  Skin: Negative for rash.  Neurological: Positive for sensory change. Negative for dizziness, tingling, tremors, speech change, focal weakness and headaches.  Endo/Heme/Allergies: Does not bruise/bleed easily.  Psychiatric/Behavioral: Negative for depression.    MEDICATIONS AT HOME:  Prior to Admission medications   Medication Sig Start Date End Date Taking? Authorizing Provider  levothyroxine (SYNTHROID, LEVOTHROID) 100 MCG tablet Take 1 tablet (100 mcg total) by mouth daily. Must keep appt on 09/05/16 for additional refills 08/20/16  Yes [name], MD  lisinopril (PRINIVIL,ZESTRIL) 10 MG tablet TAKE 1 TABLET BY MOUTH  DAILY 08/23/16  Yes Nelva Bush, MD  pregabalin (LYRICA) 100 MG capsule Take 1 capsule (100 mg total) by mouth 2 (two)  times daily. 09/23/16  Yes [name], MD  tadalafil (CIALIS) 10 MG tablet Take as directed. 09/28/15  Yes [name], MD  glucose blood (FREESTYLE LITE) test strip Check blood sugars twice a day (Dx. 250.02) 07/21/13   [name], MD  SYRINGE-NEEDLE, DISP, 3 ML 25G X 1" 3 ML MISC Use to administer weekly b12 injections, then month after 6 weeks. thanks 03/04/16   [name], MD      VITAL SIGNS:  Blood pressure (!) 165/78, pulse 80, temperature 98 F (36.7 C), temperature source Oral, resp. rate 15, height 6' (1.829 m), weight 117.9 kg (260 lb), SpO2 94 %.  PHYSICAL EXAMINATION:   Physical Exam  GENERAL:  46 y.o.-year-old obese patient sitting in the bed with no acute distress.  EYES: Pupils equal, round, reactive to light and accommodation. No scleral icterus. Extraocular muscles intact.  HEENT: Head atraumatic, normocephalic. Oropharynx and nasopharynx clear. No facial droop noted. NECK:  Supple, no jugular venous distention. No thyroid enlargement, no tenderness.  LUNGS: Normal breath sounds bilaterally, no wheezing, rales,rhonchi or crepitation. No use of accessory muscles of respiration.  CARDIOVASCULAR: S1, S2 normal. No murmurs, rubs, or gallops.  ABDOMEN: Soft, nontender, nondistended. Bowel sounds present. No organomegaly or mass.  EXTREMITIES: No pedal edema, cyanosis, or clubbing.  NEUROLOGIC: Cranial nerves II through XII are intact. Muscle strength 5/5 in all extremities. Sensation intact but feels heavy on the right side of the face still. No facial droop noted.. Gait not checked.  PSYCHIATRIC: The patient is alert and oriented x 3.  SKIN: No obvious rash, lesion, or ulcer.   LABORATORY PANEL:   CBC  Recent Labs Lab 10/20/16 1737  WBC 6.1  HGB 15.9  HCT 46.3  PLT 205   ------------------------------------------------------------------------------------------------------------------  Chemistries   Recent Labs Lab 10/20/16 1737  NA 138  K 3.8    CL 103  CO2 27  GLUCOSE 265*  BUN 15  CREATININE 1.03  CALCIUM 8.9  AST 18  ALT 14*  ALKPHOS 67  BILITOT 0.6   ------------------------------------------------------------------------------------------------------------------  Cardiac Enzymes  Recent Labs Lab 10/20/16 1737  TROPONINI <0.03   ------------------------------------------------------------------------------------------------------------------  RADIOLOGY:  Ct Head Code Stroke W/o Cm  Result Date: 10/20/2016 CLINICAL DATA:  Code stroke.  Facial droop and right EXAM: CT HEAD WITHOUT CONTRAST TECHNIQUE: Contiguous axial images were obtained from the base of the skull through the vertex without intravenous contrast. COMPARISON:  01/27/2014 FINDINGS: Brain: Ventricle size normal. Negative for acute or chronic ischemia. Negative for hemorrhage or mass. Vascular: Diffusely hyperdense artery similar to the prior study and likely due to high hematocrit. Skull: Negative Sinuses/Orbits: Negative Other: None ASPECTS ([employer] [place] Stroke Program Early CT Score) - Ganglionic level infarction (caudate, lentiform nuclei, internal capsule, insula, M1-M3 cortex): 7 - Supraganglionic infarction (M4-M6 cortex): 3 Total score (0-10 with 10 being normal): 10 IMPRESSION: 1. Negative CT head 2. ASPECTS is 10 These results were called by telephone at the time of interpretation on 10/20/2016 at 5:50 pm to Dr. Mable Paris, who verbally acknowledged these  results. Electronically Signed   By: Franchot Gallo M.D.   On: 10/20/2016 17:51    EKG:   Orders placed or performed during the hospital encounter of 10/20/16  . ED EKG  . ED EKG    IMPRESSION AND PLAN:   Gregory Crane  is a 46 y.o. male with a known history of Obesity, hypertension, diet-controlled diabetes mellitus, hypothyroidism presents to hospital secondary to onset of right facial numbness associated with right facial droop and inability to close his right eye completely.  #1 TIA- admit under  obs, neuro checks - MRI, MRA of the brain, carotid dopplers and ECHO with bubble study - start asa as patient not on any at home - lipid panel, a1c pending, started statin - PT/OT consults  #2 HTN- continue lisinopril  #3 Diet controlled DM- a1c pending, SSI  #4 Diabetic neuropathy-continue Lyrica  #5 DVT prophylaxis-on Lovenox     All the records are reviewed and case discussed with ED provider. Management plans discussed with the patient, family and they are in agreement.  CODE STATUS: Full Code  TOTAL TIME TAKING CARE OF THIS PATIENT: 50 minutes.    Gladstone Lighter M.D on 10/20/2016 at 6:53 PM  Between 7am to 6pm - Pager - [phone]  After 6pm go to [email or web address] - password [email or web address] [facility]  Office  [phone]  CC: Primary care physician; [name], MD

## 2016-10-20 NOTE — ED Provider Notes (Signed)
[facility] Emergency Department Provider Note  ____________________________________________   First MD Initiated Contact with Patient 10/20/16 1746     (approximate)  I have reviewed the triage vital signs and the nursing notes.   HISTORY  Chief Complaint Facial Droop and Numbness    HPI Gregory Crane is a 46 y.o. male who comes to the emergency department with right facial droop and right facial numbness that began roughly 45 minutes prior to arrival. He was having dinner with his wife and shortly after finishing she noted that the right corner of his mouth was drooping down and she had triple on his right eye as well. He did not have dysarthria. She is noted the right side of his face is numb. No weakness in his arms or legs. No headache. She's never had a stroke before. He has a history of hypothyroidism, hypertension, and diet-controlled diabetes mellitus. He's never had a heart attack.   Past Medical History:  Diagnosis Date  . Allergy   . Diabetes mellitus without complication ([place])   . Hypertension   . Hypothyroidism   . Kidney stones     Patient Active Problem List   Diagnosis Date Noted  . Near syncope 06/23/2016  . Erectile dysfunction 10/01/2015  . Neuropathy ([place]) 08/06/2015  . Health care maintenance 08/06/2015  . Essential hypertension 08/06/2015  . Heme positive stool 10/10/2013  . Hypothyroidism 10/10/2013  . Microalbuminuria 10/10/2013  . Hyperlipidemia 10/10/2013  . B12 deficiency 10/10/2013  . Diabetes ([place]) 07/04/2013  . Environmental allergies 07/04/2013    Past Surgical History:  Procedure Laterality Date  . LUMBAR PUNCTURE     as child  . NO PAST SURGERIES      Prior to Admission medications   Medication Sig Start Date End Date Taking? Authorizing Provider  levothyroxine (SYNTHROID, LEVOTHROID) 100 MCG tablet Take 1 tablet (100 mcg total) by mouth daily. Must keep appt on 09/05/16 for additional refills 08/20/16   Yes [name], MD  lisinopril (PRINIVIL,ZESTRIL) 10 MG tablet TAKE 1 TABLET BY MOUTH  DAILY 08/23/16  Yes Nelva Bush, MD  pregabalin (LYRICA) 100 MG capsule Take 1 capsule (100 mg total) by mouth 2 (two) times daily. 09/23/16  Yes [name], MD  tadalafil (CIALIS) 10 MG tablet Take as directed. 09/28/15  Yes [name], MD  glucose blood (FREESTYLE LITE) test strip Check blood sugars twice a day (Dx. 250.02) 07/21/13   [name], MD  SYRINGE-NEEDLE, DISP, 3 ML 25G X 1" 3 ML MISC Use to administer weekly b12 injections, then month after 6 weeks. thanks 03/04/16   [name], MD    Allergies Patient has no known allergies.  Family History  Problem Relation Age of Onset  . Breast cancer Mother   . Diabetes Father   . Diabetes Sister   . Arthritis Maternal Grandmother   . Diabetes Maternal Grandmother     Social History Social History  Substance Use Topics  . Smoking status: Never Smoker  . Smokeless tobacco: Never Used  . Alcohol use No     Comment: very rarely (1-2/year)    Review of Systems Constitutional: No fever/chills Eyes: No visual changes. ENT: No sore throat. Cardiovascular: Denies chest pain. Respiratory: Denies shortness of breath. Gastrointestinal: No abdominal pain.  No nausea, no vomiting.  No diarrhea.  No constipation. Genitourinary: Negative for dysuria. Musculoskeletal: Negative for back pain. Skin: Negative for rash. Neurological: Negative for headaches,Positive for focal weakness and numbness  10-point ROS otherwise  negative.  ____________________________________________   PHYSICAL EXAM:  VITAL SIGNS: ED Triage Vitals  Enc Vitals Group     BP --      Pulse Rate 10/20/16 1746 87     Resp 10/20/16 1746 18     Temp 10/20/16 1746 98 F (36.7 C)     Temp Source 10/20/16 1746 Oral     SpO2 10/20/16 1746 96 %     Weight 10/20/16 1745 260 lb (117.9 kg)     Height 10/20/16 1745 6' (1.829 m)     Head Circumference --       Peak Flow --      Pain Score --      Pain Loc --      Pain Edu? --      Excl. in [place]? --     Constitutional: Alert and oriented. Well appearing and in no acute distress. Eyes: Conjunctivae are normal. PERRL. EOMI. Head: Atraumatic. Nose: No congestion/rhinnorhea. Mouth/Throat: Mucous membranes are moist.  Oropharynx non-erythematous. Neck: No stridor.   Cardiovascular: Normal rate, regular rhythm. Grossly normal heart sounds.  Good peripheral circulation. Respiratory: Normal respiratory effort.  No retractions. Lungs CTAB. Gastrointestinal: Soft and nontender. No distention. No abdominal bruits. No CVA tenderness. Musculoskeletal: No lower extremity tenderness nor edema.  No joint effusions. Neurologic:  Cranial nerves II through XII intact Pupils equal round and reactive to light 5 mm to 2 mm brisk bilaterally No pronator drift 5 out of 5 grips biceps triceps hip flexion and hip extension plantar flexion dorsiflexion throughout Sensation intact to light touch throughout 2+ DTRs No ankle clonus Skin:  Skin is warm, dry and intact. No rash noted. Psychiatric: Mood and affect are normal. Speech and behavior are normal.  ____________________________________________   LABS (all labs ordered are listed, but only abnormal results are displayed)  Labs Reviewed  COMPREHENSIVE METABOLIC PANEL - Abnormal; Notable for the following:       Result Value   Glucose, Bld 265 (*)    ALT 14 (*)    All other components within normal limits  GLUCOSE, CAPILLARY - Abnormal; Notable for the following:    Glucose-Capillary 293 (*)    All other components within normal limits  PROTIME-INR  APTT  CBC  DIFFERENTIAL  TROPONIN I  CBG MONITORING, ED   ____________________________________________  EKG   ____________________________________________  RADIOLOGY  Head CT with no acute disease ____________________________________________   PROCEDURES  Procedure(s) performed:  no  Procedures  Critical Care performed: Yes.  CRITICAL CARE Performed by: Darel Hong   Total critical care time: 33 minutes  Critical care time was exclusive of separately billable procedures and treating other patients.  Critical care was necessary to treat or prevent imminent or life-threatening deterioration.  Critical care was time spent personally by me on the following activities: development of treatment plan with patient and/or surrogate as well as nursing, discussions with consultants, evaluation of patient's response to treatment, examination of patient, obtaining history from patient or surrogate, ordering and performing treatments and interventions, ordering and review of laboratory studies, ordering and review of radiographic studies, pulse oximetry and re-evaluation of patient's condition.   ____________________________________________   INITIAL IMPRESSION / ASSESSMENT AND PLAN / ED COURSE  Pertinent labs & imaging results that were available during my care of the patient were reviewed by me and considered in my medical decision making (see chart for details).  On arrival code stroke was initiated by triage nurse. I was at the patient's bedside immediately and  at that point he had a completely normal neurological exam. CT head is negative for acute pathology. His symptoms are most concerning for a transient ischemic attack/ordered an MRI and she will require inpatient admission for neurology evaluation and medication optimization.     ----------------------------------------- 5:51 PM on 10/20/2016 -----------------------------------------  I discussed the case with the neurologist on call for [facility] and we both agree the patient is not a TPA candidate.  I appreciate the patient's elevated blood pressure but as he is not at TPA candidate and will let it ride to a systolic of 859 to make sure his brain has adequate  perfusion. ____________________________________________   FINAL CLINICAL IMPRESSION(S) / ED DIAGNOSES  Final diagnoses:  Transient cerebral ischemia, unspecified type  Type 2 diabetes mellitus with other neurologic complication, without long-term current use of insulin (HCC)  Hypertension, unspecified type  Obesity, unspecified classification, unspecified obesity type, unspecified whether serious comorbidity present      NEW MEDICATIONS STARTED DURING THIS VISIT:  New Prescriptions   No medications on file     Note:  This document was prepared using Dragon voice recognition software and may include unintentional dictation errors.     Darel Hong, MD 10/20/16 [phone]

## 2016-10-20 NOTE — ED Notes (Signed)
Pt to MRI via stretcher with EDT Marcie Bal.

## 2016-10-21 ENCOUNTER — Observation Stay (HOSPITAL_BASED_OUTPATIENT_CLINIC_OR_DEPARTMENT_OTHER)
Admit: 2016-10-21 | Discharge: 2016-10-21 | Disposition: A | Discharge status: Home / Self Care | Payer: 59 | Attending: Internal Medicine | Admitting: Internal Medicine

## 2016-10-21 ENCOUNTER — Observation Stay: Payer: 59

## 2016-10-21 DIAGNOSIS — I6350 Cerebral infarction due to unspecified occlusion or stenosis of unspecified cerebral artery: Secondary | ICD-10-CM

## 2016-10-21 LAB — CBC
HCT: 40.8 % (ref 40.0–52.0)
Hemoglobin: 14.6 g/dL (ref 13.0–18.0)
MCH: 30.5 pg (ref 26.0–34.0)
MCHC: 35.7 g/dL (ref 32.0–36.0)
MCV: 85.5 fL (ref 80.0–100.0)
Platelets: 185 10*3/uL (ref 150–440)
RBC: 4.77 MIL/uL (ref 4.40–5.90)
RDW: 11.8 % (ref 11.5–14.5)
WBC: 6 10*3/uL (ref 3.8–10.6)

## 2016-10-21 LAB — BASIC METABOLIC PANEL
Anion gap: 4 — ABNORMAL LOW (ref 5–15)
BUN: 14 mg/dL (ref 6–20)
CO2: 27 mmol/L (ref 22–32)
Calcium: 8.5 mg/dL — ABNORMAL LOW (ref 8.9–10.3)
Chloride: 106 mmol/L (ref 101–111)
Creatinine, Ser: 0.9 mg/dL (ref 0.61–1.24)
GFR calc Af Amer: >60 mL/min (ref >60)
GFR calc non Af Amer: >60 mL/min (ref >60)
Glucose, Bld: 218 mg/dL — ABNORMAL HIGH (ref 65–99)
Potassium: 3.7 mmol/L (ref 3.5–5.1)
Sodium: 137 mmol/L (ref 135–145)

## 2016-10-21 LAB — GLUCOSE, CAPILLARY: Glucose-Capillary: 211 mg/dL — ABNORMAL HIGH (ref 65–99)

## 2016-10-21 MED ORDER — LIVING WELL WITH DIABETES BOOK
Freq: Once | Status: AC
  Administered 2016-10-21: 09:00:00
  Filled 2016-10-21: qty 1

## 2016-10-21 MED ORDER — ASPIRIN 81 MG PO TBEC: 81.0000 mg | DELAYED_RELEASE_TABLET | Freq: Every day | ORAL | 1 refills | Status: DC

## 2016-10-21 NOTE — Progress Notes (Signed)
*  PRELIMINARY RESULTS* Echocardiogram 2D Echocardiogram has been performed.  Sherrie Sport 10/21/2016, 8:58 AM

## 2016-10-21 NOTE — Progress Notes (Signed)
Inpatient Diabetes Program Recommendations  AACE/ADA: New Consensus Statement on Inpatient Glycemic Control (2015)  Target Ranges:  Prepandial:   less than 140 mg/dL      Peak postprandial:   less than 180 mg/dL (1-2 hours)      Critically ill patients:  140 - 180 mg/dL  Results for DORSEL, FLINN (MRN [id number]) as of 10/21/2016 08:06  Ref. Range 10/20/2016 18:08 10/20/2016 20:57 10/21/2016 08:01  Glucose-Capillary Latest Ref Range: 65 - 99 mg/dL 293 (H) 232 (H) 211 (H)   Results for QUINTRELL, BAZE [facility] (MRN [id number]) as of 10/21/2016 08:06  Ref. Range 10/20/2016 17:37 10/21/2016 04:01  Glucose Latest Ref Range: 65 - 99 mg/dL 265 (H) 218 (H)   Results for FARRIS, GEIMAN [facility] (MRN [id number]) as of 10/21/2016 08:06  Ref. Range 06/27/2016 11:04  Hemoglobin A1C Latest Ref Range: 4.8 - 5.6 % 9.3 (H)   Review of Glycemic Control  Diabetes history: DM2 Outpatient Diabetes medications: None (diet controlled) Current orders for Inpatient glycemic control: Novolog 0-9 units TID with meals, Novolog 0-5 units QHS  Inpatient Diabetes Program Recommendations: HgbA1C: A1C 9.3% on 06/27/16 and current A1C pending. Anticipate A1C to be elevated as glucose has ranged from 211-293 mg/dl since admitted. Recommend patient be started on oral DM medication(s) now and continued at time of discharge and have patient follow up with PCP.  NOTE: In reviewing the chart, noted patient seen PCP on 09/08/16 and per office note on 09/08/16 by Dr. Nicki Reaper "He is not taking any diabetic medication now.  Refuses at this time.  Has adjusted his diet.  States sugars are now averaging 120-130." With A1C of 9.3% on 06/27/16 patient needs to be on DM medications. Please discuss with patient and explain risk of complications from uncontrolled DM.   Thanks, Gregory Alderman, RN, MSN, CDE Diabetes Coordinator Inpatient Diabetes Program [phone] (Team Pager from 8am to 5pm)

## 2016-10-21 NOTE — Progress Notes (Signed)
Patient discharged home per MD order. Diabetes and Stroke booklet given to patient and all questions answered. Prescription given to patient. All  Discharge instructions given and all questions answered.

## 2016-10-21 NOTE — Evaluation (Signed)
Physical Therapy Evaluation Patient Details Name: Gregory Crane MRN: [id number] DOB: [birth year] Today's Date: 10/21/2016   History of Present Illness  Pt is a 46 yo male, admitted to [facility] w/ R facial droop that has since resolved, CT and MRI are negative for acute infarct. PMH includes; Diabetes w/ neuropathy, obesity, and hypothyroidism    Clinical Impression  Pt awake, alert and demonstrated good communication and safety awareness throughout PT eval. Pt is independent w/ bed mobility, transfers, ambulation and stairs w/ no need for AD. Strength and sensation are WFLs. He displayed minor difficulties w/ single leg stance, and tandem standing, but overall appears to be functioning at baseline levels. Pt does not demonstrate need for skilled PT at this time and is safe for independent mobility. Will complete orders, please re-consult if status changes.     Follow Up Recommendations No PT follow up    Equipment Recommendations  None recommended by PT    Recommendations for Other Services       Precautions / Restrictions Precautions Precautions: None Restrictions Weight Bearing Restrictions: No      Mobility  Bed Mobility Overal bed mobility: Independent             General bed mobility comments: able to move from supine to sitting w/o difficulty   Transfers Overall transfer level: Independent Equipment used: None                Ambulation/Gait Ambulation/Gait assistance: Supervision Ambulation Distance (Feet): 250 Feet Assistive device: None Gait Pattern/deviations: Step-through pattern;Decreased stride length   Gait velocity interpretation: at or above normal speed for age/gender General Gait Details: Overall good gait, slight decrease in stride length w/ minimal swaying, states he is at baseline for ambulation, no LOB or signs or staggering   Stairs   6 steps w/o railing- PT supervision, WFLs and safe ascension and descending w/ no LOB            Wheelchair Mobility    Modified Rankin (Stroke Patients Only)       Balance Overall balance assessment: Independent               Single Leg Stance - Right Leg: 4 (secs) Single Leg Stance - Left Leg: 4 (secs) Tandem Stance - Right Leg: 3 (secs) Tandem Stance - Left Leg: 3 (secs) [place] - Eyes Opened: 60 (secs) [place] - Eyes Closed: 30 (secs) High level balance activites: Head turns;Turns   Standardized Balance Assessment Standardized Balance Assessment : Dynamic Gait Index   Dynamic Gait Index Level Surface: Normal Change in Gait Speed: Normal Gait with Horizontal Head Turns: Normal Gait with Vertical Head Turns: Normal Step Over Obstacle: Normal Steps: Normal       Pertinent Vitals/Pain Pain Assessment: No/denies pain    Home Living Family/patient expects to be discharged to:: Private residence Living Arrangements: Spouse/significant other Available Help at Discharge: Family (spouse) Type of Home: House Home Access: Stairs to enter Entrance Stairs-Rails: Right Entrance Stairs-Number of Steps: 3 Home Layout: Two level;Bed/bath upstairs Home Equipment: None      Prior Function Level of Independence: Independent         Comments: Pt is independent in all ADLs, and IADLs at baseline     Hand Dominance   Dominant Hand: Right    Extremity/Trunk Assessment   Upper Extremity Assessment Upper Extremity Assessment: Overall WFL for tasks assessed    Lower Extremity Assessment Lower Extremity Assessment: Overall WFL for tasks assessed  Communication   Communication: No difficulties  Cognition Arousal/Alertness: Awake/alert Behavior During Therapy: WFL for tasks assessed/performed Overall Cognitive Status: Within Functional Limits for tasks assessed                      General Comments General comments (skin integrity, edema, etc.): modified DGI appears WFL for all tasks, did not assess pivot turns and steping around  obstacles, modified DGI score 12/12 - low fall risk     Exercises     Assessment/Plan    PT Assessment Patent does not need any further PT services  PT Problem List         PT Treatment Interventions      PT Goals (Current goals can be found in the Care Plan section)  Acute Rehab PT Goals Patient Stated Goal: Return home PT Goal Formulation: With patient/family Time For Goal Achievement: 11/04/16 Potential to Achieve Goals: Good    Frequency     Barriers to discharge        Co-evaluation               End of Session Equipment Utilized During Treatment: Gait belt Activity Tolerance: Patient tolerated treatment well Patient left: in bed;with family/visitor present Nurse Communication: Mobility status           Time: 5189-8421 PT Time Calculation (min) (ACUTE ONLY): 12 min   Charges:         PT G Codes:         [employer] Student PT 10/21/2016, 11:00 AM

## 2016-10-21 NOTE — Discharge Summary (Signed)
[facility] NAME: Gregory Crane    MR#:  [id number]  DATE OF BIRTH:  01-Mar-1971  DATE OF ADMISSION:  10/20/2016 ADMITTING PHYSICIAN: Gladstone Lighter, MD  DATE OF DISCHARGE: 10/21/2016 12:03 PM  PRIMARY CARE PHYSICIAN: [name], MD    ADMISSION DIAGNOSIS:  TIA (transient ischemic attack) [G45.9] Transient cerebral ischemia, unspecified type [G45.9] Type 2 diabetes mellitus with other neurologic complication, without long-term current use of insulin (HCC) [E11.49] Hypertension, unspecified type [I10] Obesity, unspecified classification, unspecified obesity type, unspecified whether serious comorbidity present [E66.9]  DISCHARGE DIAGNOSIS:  Active Problems:   TIA (transient ischemic attack) Bell's Palsy  SECONDARY DIAGNOSIS:   Past Medical History:  Diagnosis Date  . Allergy   . Diabetes mellitus without complication (HCC)    diet controlled  . Hypertension   . Hypothyroidism   . Kidney stones     HOSPITAL COURSE:   46 year old male with past medical history of diabetes, hypertension, obesity who presents to the hospital due to a right-sided facial droop.  1. Right-sided facial droop-patient was admitted to the hospital for workup of suspected CVA which has not been ruled out. Patient underwent extensive testing including CT of the head, MRI/MRA of the brain, carotid duplex which have shown no evidence of acute pathology. Patient's MRI did show an old CVA but no acute abnormality. Patient's carotid duplex showed no hemodynamically significant carotid stenosis with antegrade flow in both vertebrals. -Patient's facial droop was likely related to Bell's palsy. -Patient has clinically improved and has no other focal neurological symptoms and therefore being discharged home.  2. Old CVA-patient has no previous history of stroke, his MRI did show an old lacunar infarct. - pt. Will be discharged on a baby ASA.   3. HTN - pt.  Will resume his Lisinopril.  4. DM - seen by diabetes coordinator. A1c was 9.  - pt. Has refused to take DM meds in the past. Cont. Follow up with PCP as outpatient.   5. Hypothyroidism - cont. Synthroid.   DISCHARGE CONDITIONS:   Stable.   CONSULTS OBTAINED:    DRUG ALLERGIES:  No Known Allergies  DISCHARGE MEDICATIONS:   Allergies as of 10/21/2016   No Known Allergies     Medication List    TAKE these medications   aspirin 81 MG EC tablet Take 1 tablet (81 mg total) by mouth daily. Start taking on:  10/22/2016   glucose blood test strip Commonly known as:  FREESTYLE LITE Check blood sugars twice a day (Dx. 250.02)   levothyroxine 100 MCG tablet Commonly known as:  SYNTHROID, LEVOTHROID Take 1 tablet (100 mcg total) by mouth daily. Must keep appt on 09/05/16 for additional refills   lisinopril 10 MG tablet Commonly known as:  PRINIVIL,ZESTRIL TAKE 1 TABLET BY MOUTH  DAILY   pregabalin 100 MG capsule Commonly known as:  LYRICA Take 1 capsule (100 mg total) by mouth 2 (two) times daily.   SYRINGE-NEEDLE (DISP) 3 ML 25G X 1" 3 ML Misc Use to administer weekly b12 injections, then month after 6 weeks. thanks   tadalafil 10 MG tablet Commonly known as:  CIALIS Take as directed.         DISCHARGE INSTRUCTIONS:   DIET:  Cardiac diet and Diabetic diet  DISCHARGE CONDITION:  Stable  ACTIVITY:  Activity as tolerated  OXYGEN:  Home Oxygen: No.   Oxygen Delivery: room air  DISCHARGE LOCATION:  home   If you experience worsening  of your admission symptoms, develop shortness of breath, life threatening emergency, suicidal or homicidal thoughts you must seek medical attention immediately by calling 911 or calling your MD immediately  if symptoms less severe.  You Must read complete instructions/literature along with all the possible adverse reactions/side effects for all the Medicines you take and that have been prescribed to you. Take any new Medicines  after you have completely understood and accpet all the possible adverse reactions/side effects.   Please note  You were cared for by a hospitalist during your hospital stay. If you have any questions about your discharge medications or the care you received while you were in the hospital after you are discharged, you can call the unit and asked to speak with the hospitalist on call if the hospitalist that took care of you is not available. Once you are discharged, your primary care physician will handle any further medical issues. Please note that NO REFILLS for any discharge medications will be authorized once you are discharged, as it is imperative that you return to your primary care physician (or establish a relationship with a primary care physician if you do not have one) for your aftercare needs so that they can reassess your need for medications and monitor your lab values.     Today   Still has some right sided facial droop.  No other complaints presently. Wife at bedside.   VITAL SIGNS:  Blood pressure (!) 161/90, pulse 78, temperature 98.1 F (36.7 C), temperature source Oral, resp. rate 18, height 6' (1.829 m), weight 117.5 kg (259 lb), SpO2 97 %.  I/O:   Intake/Output Summary (Last 24 hours) at 10/21/16 1530 Last data filed at 10/20/16 2000  Gross per 24 hour  Intake                0 ml  Output              300 ml  Net             -300 ml    PHYSICAL EXAMINATION:  GENERAL:  46 y.o.-year-old patient lying in the bed with no acute distress.  EYES: Pupils equal, round, reactive to light and accommodation. No scleral icterus. Extraocular muscles intact.  HEENT: Head atraumatic, normocephalic. Oropharynx and nasopharynx clear.  NECK:  Supple, no jugular venous distention. No thyroid enlargement, no tenderness.  LUNGS: Normal breath sounds bilaterally, no wheezing, rales,rhonchi. No use of accessory muscles of respiration.  CARDIOVASCULAR: S1, S2 normal. No murmurs, rubs, or  gallops.  ABDOMEN: Soft, non-tender, non-distended. Bowel sounds present. No organomegaly or mass.  EXTREMITIES: No pedal edema, cyanosis, or clubbing.  NEUROLOGIC: Cranial nerves II through XII are intact. No focal motor or sensory defecits b/l. Slight right sided facial droop.  PSYCHIATRIC: The patient is alert and oriented x 3. Good affect.  SKIN: No obvious rash, lesion, or ulcer.   DATA REVIEW:   CBC  Recent Labs Lab 10/21/16 0401  WBC 6.0  HGB 14.6  HCT 40.8  PLT 185    Chemistries   Recent Labs Lab 10/20/16 1737 10/21/16 0401  NA 138 137  K 3.8 3.7  CL 103 106  CO2 27 27  GLUCOSE 265* 218*  BUN 15 14  CREATININE 1.03 0.90  CALCIUM 8.9 8.5*  AST 18  --   ALT 14*  --   ALKPHOS 67  --   BILITOT 0.6  --     Cardiac Enzymes  Recent Labs Lab 10/20/16 1737  TROPONINI <0.03    Microbiology Results  No results found for this or any previous visit.  RADIOLOGY:  Mr Virgel Paling AO Contrast  Result Date: 10/20/2016 CLINICAL DATA:  TIA. Right facial drooping today. Diabetes. Hypertension. EXAM: MRI HEAD WITHOUT CONTRAST MRA HEAD WITHOUT CONTRAST TECHNIQUE: Multiplanar, multiecho pulse sequences of the brain and surrounding structures were obtained without intravenous contrast. Angiographic images of the head were obtained using MRA technique without contrast. COMPARISON:  CT head 10/20/2016 FINDINGS: MRI HEAD FINDINGS Brain: Ventricle size and cerebral volume normal. Negative for acute infarct. Small chronic infarct left putamen. Cerebral white matter normal. Brainstem and cerebellum normal. Negative for hemorrhage or mass.  Pituitary normal in size. Vascular: Normal arterial flow voids. Skull and upper cervical spine: Negative Sinuses/Orbits: Negative Other: None MRA HEAD FINDINGS Right cavernous carotid is widely patent. Right anterior and middle cerebral arteries are normal. There is irregularity and narrowing of the left cavernous carotid. There is significant  thickening of the left carotid artery as it passes through the skullbase and into the cavernous segment causing multiple areas of moderate stenosis. The cervical internal carotid artery is smaller than the right suggesting low flow. There is irregularity of the left A1 segment which is patent. The left M1 segment is diffusely narrowed. There is diffuse narrowing of left M2 segments suggesting atherosclerotic disease. Both vertebral arteries patent to the basilar. PICA not visualized. AICA, superior cerebellar, posterior cerebral arteries patent. Moderate stenosis distal left PCA. Basilar widely patent. Negative for aneurysm. IMPRESSION: Negative for acute infarct.  Small chronic infarct left putamen Narrowing of the left internal carotid artery as it passes through the skull base and cavernous segments with associated wall thickening. This is most likely due to atherosclerotic disease given the history of diabetes and hypertension. This is not a typical pattern and location for dissection. Left internal carotid artery smaller than the right suggesting low flow state. There is irregularity and stenosis involving the left A1 segment. Diffuse narrowing of left M1 and M2 segments suggesting atherosclerotic disease. Atherosclerotic narrowing distal left PCA. Electronically Signed   By: Franchot Gallo M.D.   On: 10/20/2016 20:30   Mr Brain Wo Contrast  Result Date: 10/20/2016 CLINICAL DATA:  TIA. Right facial drooping today. Diabetes. Hypertension. EXAM: MRI HEAD WITHOUT CONTRAST MRA HEAD WITHOUT CONTRAST TECHNIQUE: Multiplanar, multiecho pulse sequences of the brain and surrounding structures were obtained without intravenous contrast. Angiographic images of the head were obtained using MRA technique without contrast. COMPARISON:  CT head 10/20/2016 FINDINGS: MRI HEAD FINDINGS Brain: Ventricle size and cerebral volume normal. Negative for acute infarct. Small chronic infarct left putamen. Cerebral white matter  normal. Brainstem and cerebellum normal. Negative for hemorrhage or mass.  Pituitary normal in size. Vascular: Normal arterial flow voids. Skull and upper cervical spine: Negative Sinuses/Orbits: Negative Other: None MRA HEAD FINDINGS Right cavernous carotid is widely patent. Right anterior and middle cerebral arteries are normal. There is irregularity and narrowing of the left cavernous carotid. There is significant thickening of the left carotid artery as it passes through the skullbase and into the cavernous segment causing multiple areas of moderate stenosis. The cervical internal carotid artery is smaller than the right suggesting low flow. There is irregularity of the left A1 segment which is patent. The left M1 segment is diffusely narrowed. There is diffuse narrowing of left M2 segments suggesting atherosclerotic disease. Both vertebral arteries patent to the basilar. PICA not visualized. AICA, superior cerebellar, posterior cerebral arteries patent. Moderate stenosis distal left PCA.  Basilar widely patent. Negative for aneurysm. IMPRESSION: Negative for acute infarct.  Small chronic infarct left putamen Narrowing of the left internal carotid artery as it passes through the skull base and cavernous segments with associated wall thickening. This is most likely due to atherosclerotic disease given the history of diabetes and hypertension. This is not a typical pattern and location for dissection. Left internal carotid artery smaller than the right suggesting low flow state. There is irregularity and stenosis involving the left A1 segment. Diffuse narrowing of left M1 and M2 segments suggesting atherosclerotic disease. Atherosclerotic narrowing distal left PCA. Electronically Signed   By: Franchot Gallo M.D.   On: 10/20/2016 20:30   US Carotid Bilateral  Result Date: 10/21/2016 CLINICAL DATA:  TIA. EXAM: BILATERAL CAROTID DUPLEX ULTRASOUND TECHNIQUE: Pearline Cables scale imaging, color Doppler and duplex ultrasound  were performed of bilateral carotid and vertebral arteries in the neck. COMPARISON:  MRI 10/20/2016 . FINDINGS: Criteria: Quantification of carotid stenosis is based on velocity parameters that correlate the residual internal carotid diameter with NASCET-based stenosis levels, using the diameter of the distal internal carotid lumen as the denominator for stenosis measurement. The following velocity measurements were obtained: RIGHT ICA:  87/24 cm/sec CCA:  33/IRJ cm/sec SYSTOLIC ICA/CCA RATIO:  1.0 DIASTOLIC ICA/CCA RATIO:  1.2 ECA:  107 cm/sec LEFT ICA:  56/13 cm/sec CCA:  188/41 cm/sec SYSTOLIC ICA/CCA RATIO:  0.5 DIASTOLIC ICA/CCA RATIO:  1.0 ECA:  111 cm/sec RIGHT CAROTID ARTERY: No significant right carotid atherosclerotic vascular disease. No flow limiting stenosis. RIGHT VERTEBRAL ARTERY:  Patent with antegrade flow. LEFT CAROTID ARTERY: No significant left carotid atherosclerotic vascular disease. No flow limiting stenosis . LEFT VERTEBRAL ARTERY:  Patent with antegrade flow. IMPRESSION: 1. No significant carotid atherosclerotic vascular disease. No flow limiting stenosis. 2.  Vertebral artery is are patent with antegrade flow. Electronically Signed   By: Marcello Moores  Register   On: 10/21/2016 09:55   Ct Head Code Stroke W/o Cm  Result Date: 10/20/2016 CLINICAL DATA:  Code stroke.  Facial droop and right EXAM: CT HEAD WITHOUT CONTRAST TECHNIQUE: Contiguous axial images were obtained from the base of the skull through the vertex without intravenous contrast. COMPARISON:  01/27/2014 FINDINGS: Brain: Ventricle size normal. Negative for acute or chronic ischemia. Negative for hemorrhage or mass. Vascular: Diffusely hyperdense artery similar to the prior study and likely due to high hematocrit. Skull: Negative Sinuses/Orbits: Negative Other: None ASPECTS ([place] Stroke Program Early CT Score) - Ganglionic level infarction (caudate, lentiform nuclei, internal capsule, insula, M1-M3 cortex): 7 - Supraganglionic  infarction (M4-M6 cortex): 3 Total score (0-10 with 10 being normal): 10 IMPRESSION: 1. Negative CT head 2. ASPECTS is 10 These results were called by telephone at the time of interpretation on 10/20/2016 at 5:50 pm to Dr. Mable Paris, who verbally acknowledged these results. Electronically Signed   By: Franchot Gallo M.D.   On: 10/20/2016 17:51      Management plans discussed with the patient, family and they are in agreement.  CODE STATUS:  Code Status History    Date Active Date Inactive Code Status Order ID Comments User Context   10/20/2016  8:44 PM 10/21/2016  3:29 PM Full Code [id number]  Gladstone Lighter, MD Inpatient      TOTAL TIME TAKING CARE OF THIS PATIENT: 40 minutes.    Henreitta Leber M.D on 10/21/2016 at 3:30 PM  Between 7am to 6pm - Pager - [phone]  After 6pm go to [email or web address] - [facility]  Office  [phone]  CC: Primary care physician; [name], MD

## 2016-10-21 NOTE — Progress Notes (Signed)
OT Cancellation Note  Patient Details Name: Gregory Crane MRN: [id number] DOB: 1971-04-07   Cancelled Treatment:    Reason Eval/Treat Not Completed: OT screened, no needs identified, will sign off.  Order received and chart reviewed. Met with patient and he is at baseline with no OT needs identified.  Thank you for the referral.  Chrys Racer, OTR/L ascom [phone] 10/21/16, 11:23 AM

## 2016-10-22 LAB — HEMOGLOBIN A1C
Hgb A1c MFr Bld: 8 % — ABNORMAL HIGH (ref 4.8–5.6)
Mean Plasma Glucose: 183 mg/dL

## 2016-10-22 LAB — HIV ANTIBODY (ROUTINE TESTING W REFLEX): HIV Screen 4th Generation wRfx: NONREACTIVE

## 2016-10-23 LAB — HM DIABETES EYE EXAM

## 2016-10-27 DIAGNOSIS — N401 Enlarged prostate with lower urinary tract symptoms: Secondary | ICD-10-CM

## 2016-10-27 DIAGNOSIS — Z87442 Personal history of urinary calculi: Secondary | ICD-10-CM | POA: Insufficient documentation

## 2016-10-27 DIAGNOSIS — N138 Other obstructive and reflux uropathy: Secondary | ICD-10-CM | POA: Insufficient documentation

## 2016-11-07 ENCOUNTER — Ambulatory Visit (INDEPENDENT_AMBULATORY_CARE_PROVIDER_SITE_OTHER): Payer: 59 | Admitting: Internal Medicine

## 2016-11-07 ENCOUNTER — Encounter: Payer: Self-pay | Admitting: Internal Medicine

## 2016-11-07 DIAGNOSIS — R809 Proteinuria, unspecified: Secondary | ICD-10-CM | POA: Diagnosis not present

## 2016-11-07 DIAGNOSIS — E785 Hyperlipidemia, unspecified: Secondary | ICD-10-CM | POA: Diagnosis not present

## 2016-11-07 DIAGNOSIS — I10 Essential (primary) hypertension: Secondary | ICD-10-CM

## 2016-11-07 DIAGNOSIS — E11319 Type 2 diabetes mellitus with unspecified diabetic retinopathy without macular edema: Secondary | ICD-10-CM

## 2016-11-07 DIAGNOSIS — Z8673 Personal history of transient ischemic attack (TIA), and cerebral infarction without residual deficits: Secondary | ICD-10-CM

## 2016-11-07 DIAGNOSIS — G510 Bell's palsy: Secondary | ICD-10-CM | POA: Diagnosis not present

## 2016-11-07 DIAGNOSIS — G629 Polyneuropathy, unspecified: Secondary | ICD-10-CM | POA: Diagnosis not present

## 2016-11-07 DIAGNOSIS — E039 Hypothyroidism, unspecified: Secondary | ICD-10-CM

## 2016-11-07 NOTE — Progress Notes (Signed)
Patient ID: Gregory Crane, male   DOB: November 26, 1970, 46 y.o.   MRN: [id number]   Subjective:    Patient ID: Gregory Crane, male    DOB: 1970/11/06, 46 y.o.   MRN: [id number]  HPI  Patient here for hospital follow up.   He is accompanied by his wife.  History obtained from both of them.  He was admitted with right side facial droop for suspected CVA.  Ruled out.  CT head, MRI/MRA and carotid duplex that revealed no acute pathology.  pts mri revealed an old CVA.  Felt facial droop was related to Bell's Palsy.  He was instructed to take a baby aspirin daily.  His face has improved.  Still some drooping, but better.  Eating ok.  No swallowing problems.  No chest pain.  No sob.  No acid reflux.  No abdominal pain.  Discussed at length with him today regarding the need to get his sugars under good control.  He has declined to take medication for his sugar.  Discussed wit him today regarding starting medication.  He declines.  Wants to work on diet and exercise.  Still with neuropathy.  On lyrica.  Still with pain.  Canceled his neurology appt.  Increased stress with family issues and home situation.  Discussed with him today.     Past Medical History:  Diagnosis Date  . Allergy   . Diabetes mellitus without complication (HCC)    diet controlled  . Hypertension   . Hypothyroidism   . Kidney stones    Past Surgical History:  Procedure Laterality Date  . LUMBAR PUNCTURE     as child  . NO PAST SURGERIES     Family History  Problem Relation Age of Onset  . Breast cancer Mother   . Diabetes Father   . Diabetes Sister   . Arthritis Maternal Grandmother   . Diabetes Maternal Grandmother    Social History   Social History  . Marital status: Married    Spouse name: N/A  . Number of children: N/A  . Years of education: N/A   Social History Main Topics  . Smoking status: Never Smoker  . Smokeless tobacco: Never Used  . Alcohol use No     Comment: very rarely (1-2/year)  . Drug use: No    . Sexual activity: Not Asked   Other Topics Concern  . None   Social History Narrative   Lives at home with wife, independent at baseline.    Outpatient Encounter Prescriptions as of 11/07/2016  Medication Sig  . aspirin EC 81 MG EC tablet Take 1 tablet (81 mg total) by mouth daily.  Marland Kitchen glucose blood (FREESTYLE LITE) test strip Check blood sugars twice a day (Dx. 250.02)  . levothyroxine (SYNTHROID, LEVOTHROID) 100 MCG tablet Take 1 tablet (100 mcg total) by mouth daily. Must keep appt on 09/05/16 for additional refills  . lisinopril (PRINIVIL,ZESTRIL) 10 MG tablet TAKE 1 TABLET BY MOUTH  DAILY  . pregabalin (LYRICA) 100 MG capsule Take 1 capsule (100 mg total) by mouth 2 (two) times daily.  . SYRINGE-NEEDLE, DISP, 3 ML 25G X 1" 3 ML MISC Use to administer weekly b12 injections, then month after 6 weeks. thanks  . tadalafil (CIALIS) 10 MG tablet Take as directed.  . Testosterone 20.25 MG/ACT (1.62%) GEL 1 pump to each shoulder daily. 2 pumps total.   No facility-administered encounter medications on file as of 11/07/2016.     Review of Systems  Constitutional: Negative  for appetite change.       Gaining weight.    HENT: Negative for congestion and sinus pressure.   Respiratory: Negative for cough, chest tightness and shortness of breath.   Cardiovascular: Negative for chest pain, palpitations and leg swelling.  Gastrointestinal: Negative for abdominal pain, diarrhea, nausea and vomiting.  Genitourinary: Negative for difficulty urinating and dysuria.  Musculoskeletal: Negative for back pain and joint swelling.  Skin: Negative for color change and rash.  Neurological: Negative for dizziness, light-headedness and headaches.       Increased neuropathic pain.   Psychiatric/Behavioral: Negative for agitation and dysphoric mood.       Objective:    Physical Exam  Constitutional: He appears well-developed and well-nourished. No distress.  HENT:  Nose: Nose normal.  Mouth/Throat:  Oropharynx is clear and moist.  Neck: Neck supple. No thyromegaly present.  Cardiovascular: Normal rate and regular rhythm.   Pulmonary/Chest: Effort normal and breath sounds normal. No respiratory distress.  Abdominal: Soft. Bowel sounds are normal. There is no tenderness.  Musculoskeletal: He exhibits no edema or tenderness.  Lymphadenopathy:    He has no cervical adenopathy.  Skin: No rash noted. No erythema.  Psychiatric: He has a normal mood and affect. His behavior is normal.    BP 130/60 (BP Location: Left Arm, Patient Position: Sitting, Cuff Size: Large)   Pulse 61   Temp 98.6 F (37 C) (Oral)   Resp 16   Ht 6' (1.829 m)   Wt 262 lb 9.6 oz (119.1 kg)   SpO2 97%   BMI 35.61 kg/m  Wt Readings from Last 3 Encounters:  11/07/16 262 lb 9.6 oz (119.1 kg)  10/20/16 259 lb (117.5 kg)  09/05/16 251 lb 6.4 oz (114 kg)     Lab Results  Component Value Date   WBC 6.0 10/21/2016   HGB 14.6 10/21/2016   HCT 40.8 10/21/2016   PLT 185 10/21/2016   GLUCOSE 218 (H) 10/21/2016   CHOL 175 10/20/2016   TRIG 420 (H) 10/20/2016   HDL 33 (L) 10/20/2016   LDLCALC UNABLE TO CALCULATE IF TRIGLYCERIDE OVER 400 mg/dL 10/20/2016   ALT 14 (L) 10/20/2016   AST 18 10/20/2016   NA 137 10/21/2016   K 3.7 10/21/2016   CL 106 10/21/2016   CREATININE 0.90 10/21/2016   BUN 14 10/21/2016   CO2 27 10/21/2016   TSH 7.530 (H) 10/20/2016   INR 0.95 10/20/2016   HGBA1C 8.0 (H) 10/20/2016    US Carotid Bilateral  Result Date: 10/21/2016 CLINICAL DATA:  TIA. EXAM: BILATERAL CAROTID DUPLEX ULTRASOUND TECHNIQUE: Pearline Cables scale imaging, color Doppler and duplex ultrasound were performed of bilateral carotid and vertebral arteries in the neck. COMPARISON:  MRI 10/20/2016 . FINDINGS: Criteria: Quantification of carotid stenosis is based on velocity parameters that correlate the residual internal carotid diameter with NASCET-based stenosis levels, using the diameter of the distal internal carotid lumen as the  denominator for stenosis measurement. The following velocity measurements were obtained: RIGHT ICA:  87/24 cm/sec CCA:  49/QPR cm/sec SYSTOLIC ICA/CCA RATIO:  1.0 DIASTOLIC ICA/CCA RATIO:  1.2 ECA:  107 cm/sec LEFT ICA:  56/13 cm/sec CCA:  916/38 cm/sec SYSTOLIC ICA/CCA RATIO:  0.5 DIASTOLIC ICA/CCA RATIO:  1.0 ECA:  111 cm/sec RIGHT CAROTID ARTERY: No significant right carotid atherosclerotic vascular disease. No flow limiting stenosis. RIGHT VERTEBRAL ARTERY:  Patent with antegrade flow. LEFT CAROTID ARTERY: No significant left carotid atherosclerotic vascular disease. No flow limiting stenosis . LEFT VERTEBRAL ARTERY:  Patent with antegrade  flow. IMPRESSION: 1. No significant carotid atherosclerotic vascular disease. No flow limiting stenosis. 2.  Vertebral artery is are patent with antegrade flow. Electronically Signed   By: Marcello Moores  Register   On: 10/21/2016 09:55       Assessment & Plan:   Problem List Items Addressed This Visit    Bell's palsy    Improving.  Follow.  Canceled his neurology appt.  Discussed f/u with neurology.        Diabetes ([place])    Discussed at length with him today.  Discussed need for treatment and better blood sugar control.  He declines medication.  Wants to work on diet and exercise.  Follow met b and a1c.        Essential hypertension    Blood pressure as outlined.  Same medication regimen.  Follow pressures.  Follow metabolic panel.        History of CVA (cerebrovascular accident)    Found on recent scan.  Discussed importance of better sugar and cholesterol control.  He wants to work in diet and exercise.  Follow.  Explained would need to start medication if remains elevated.        Hyperlipidemia    Low cholesterol diet and exercise.  Follow lipid panel.        Hypothyroidism    On thyroid supplements.  Follow tsh.       Microalbuminuria    On lisinopril.        Neuropathy ([place])    On lyrica now.  Persistent pain.  Seeing neurology.             [name], MD

## 2016-11-07 NOTE — Progress Notes (Signed)
Pre-visit discussion using our clinic review tool. No additional management support is needed unless otherwise documented below in the visit note.  

## 2016-11-10 ENCOUNTER — Encounter: Payer: Self-pay | Admitting: Internal Medicine

## 2016-11-10 DIAGNOSIS — Z8673 Personal history of transient ischemic attack (TIA), and cerebral infarction without residual deficits: Secondary | ICD-10-CM | POA: Insufficient documentation

## 2016-11-10 DIAGNOSIS — G510 Bell's palsy: Secondary | ICD-10-CM | POA: Insufficient documentation

## 2016-11-10 NOTE — Assessment & Plan Note (Signed)
Low cholesterol diet and exercise.  Follow lipid panel.   

## 2016-11-10 NOTE — Assessment & Plan Note (Signed)
Discussed at length with him today.  Discussed need for treatment and better blood sugar control.  He declines medication.  Wants to work on diet and exercise.  Follow met b and a1c.

## 2016-11-10 NOTE — Assessment & Plan Note (Signed)
Improving.  Follow.  Canceled his neurology appt.  Discussed f/u with neurology.

## 2016-11-10 NOTE — Assessment & Plan Note (Signed)
On lisinopril

## 2016-11-10 NOTE — Assessment & Plan Note (Signed)
Blood pressure as outlined.  Same medication regimen.  Follow pressures.  Follow metabolic panel.  

## 2016-11-10 NOTE — Assessment & Plan Note (Signed)
Found on recent scan.  Discussed importance of better sugar and cholesterol control.  He wants to work in diet and exercise.  Follow.  Explained would need to start medication if remains elevated.

## 2016-11-10 NOTE — Assessment & Plan Note (Signed)
On thyroid supplements.  Follow tsh.

## 2016-11-10 NOTE — Assessment & Plan Note (Signed)
On lyrica now.  Persistent pain.  Seeing neurology.

## 2016-11-12 LAB — HM DIABETES EYE EXAM

## 2016-11-25 ENCOUNTER — Other Ambulatory Visit: Payer: Self-pay | Admitting: Internal Medicine

## 2016-12-02 ENCOUNTER — Encounter: Payer: Self-pay | Admitting: Internal Medicine

## 2016-12-18 ENCOUNTER — Ambulatory Visit (INDEPENDENT_AMBULATORY_CARE_PROVIDER_SITE_OTHER): Payer: 59 | Admitting: Internal Medicine

## 2016-12-18 DIAGNOSIS — E11319 Type 2 diabetes mellitus with unspecified diabetic retinopathy without macular edema: Secondary | ICD-10-CM

## 2016-12-30 ENCOUNTER — Encounter: Payer: Self-pay | Admitting: Internal Medicine

## 2016-12-31 NOTE — Telephone Encounter (Signed)
If the cough is persistent, he needs to be evaluated.  Lisinopril can have as a side effect (in some people), a persistent dry cough.  We can change the lisinopril to an equivalent blood pressure medication and see if the cough subsides.  Also is he having any acid reflux?  Any sob or other acute symptoms?  If no, then I can work him in next Wednesday 01/08/17 at 1:00.  Thanks.  Also, I can go ahead and change lisinopril if no other acute symptoms.  Just let me know.

## 2017-01-01 NOTE — Telephone Encounter (Signed)
Talked with patient no symptoms of GERD that he is aware of ,and he stated he has been on the lisinopril for a long time and this just started the last month or so, denied any sinus drainage, cough dry , afebrile. Nurse could tell the more patient talked the cough became more consistent and prolonged deep dry cough. Patient denied being SOB , but that's what it sounded like when coughing like not enough air to talk and breathe at the same time. Patient available time he can come in without missing work is 01/09/17 on Thursday at 1, please advise.

## 2017-01-01 NOTE — Telephone Encounter (Signed)
I don't normally schedule pts at 1:00, but if this is the only time he can come in - go ahead and schedule at 1:00.   Since he is not going to be evaluated until 01/09/17, I would still like to change the lisinopril.  Even though he has been on the medication for a while, this can still occur (long after starting the medication).  Need to make sure not contributing.  If agreeable, I would like to stop lisinopril and start losartan 25mg  q day.  Keep appt next week.  If any acute issues, evaluate immediately.

## 2017-01-01 NOTE — Telephone Encounter (Signed)
Left message for patient to return all to office.

## 2017-01-01 NOTE — Telephone Encounter (Signed)
Pt returned office phone call. Please call him back.

## 2017-01-02 MED ORDER — LOSARTAN POTASSIUM 25 MG PO TABS: 25.0000 mg | ORAL_TABLET | Freq: Every day | ORAL | 0 refills | Status: DC

## 2017-01-02 NOTE — Telephone Encounter (Signed)
Patient agreed to stopping lisinopril and to appointment date and time advised patient this was a special request not normal appointment hours . Patient was very appreciative, patient willing to start losartan 25 mg Q daily , script sent to [facility] 30 day supply no refills.FYI

## 2017-01-05 NOTE — Progress Notes (Signed)
Patient ID: Gregory Crane, male   DOB: Jul 22, 1971, 45 y.o.   MRN: [id number] Appt was scheduled to f/u diabetes.  Pt was not seen.  appt was cancelled.    Dr Nicki Reaper

## 2017-01-09 ENCOUNTER — Encounter: Payer: Self-pay | Admitting: Internal Medicine

## 2017-01-09 ENCOUNTER — Ambulatory Visit (INDEPENDENT_AMBULATORY_CARE_PROVIDER_SITE_OTHER): Payer: 59 | Admitting: Internal Medicine

## 2017-01-09 ENCOUNTER — Ambulatory Visit (INDEPENDENT_AMBULATORY_CARE_PROVIDER_SITE_OTHER): Payer: 59

## 2017-01-09 VITALS — BP 118/78 | HR 83 | Temp 97.6°F | Wt 269.0 lb

## 2017-01-09 DIAGNOSIS — E11319 Type 2 diabetes mellitus with unspecified diabetic retinopathy without macular edema: Secondary | ICD-10-CM

## 2017-01-09 DIAGNOSIS — R059 Cough, unspecified: Secondary | ICD-10-CM

## 2017-01-09 DIAGNOSIS — R053 Chronic cough: Secondary | ICD-10-CM

## 2017-01-09 DIAGNOSIS — I10 Essential (primary) hypertension: Secondary | ICD-10-CM

## 2017-01-09 DIAGNOSIS — R05 Cough: Secondary | ICD-10-CM | POA: Diagnosis not present

## 2017-01-09 MED ORDER — FLUTICASONE PROPIONATE HFA 110 MCG/ACT IN AERO: 2.0000 | INHALATION_SPRAY | Freq: Two times a day (BID) | RESPIRATORY_TRACT | 1 refills | Status: DC

## 2017-01-09 NOTE — Progress Notes (Signed)
Patient ID: Michah Minton, male   DOB: 01/29/1971, 46 y.o.   MRN: [id number]   Subjective:    Patient ID: Jerico Grisso, male    DOB: 03-10-1971, 46 y.o.   MRN: [id number]  HPI  Patient here as a work in with concerns regarding persistent cough.  Cough has been persistent.  No chest congestion.  No chest tightness or sob.  I stopped his ace inhibitor recently.  Started on ARB.  Maybe some slight improvement, but still with persistent cough.  No acid reflux reported.  No abdominal pain.  Bowels moving.  Not having light headedness like he was experiencing previously.  Has not been watching his diet well.  Plans to start.  Discussed with him and his wife today.  History obtained from both of them.     Past Medical History:  Diagnosis Date  . Allergy   . Diabetes mellitus without complication (HCC)    diet controlled  . Hypertension   . Hypothyroidism   . Kidney stones    Past Surgical History:  Procedure Laterality Date  . LUMBAR PUNCTURE     as child  . NO PAST SURGERIES     Family History  Problem Relation Age of Onset  . Breast cancer Mother   . Diabetes Father   . Diabetes Sister   . Arthritis Maternal Grandmother   . Diabetes Maternal Grandmother    Social History   Social History  . Marital status: Married    Spouse name: N/A  . Number of children: N/A  . Years of education: N/A   Social History Main Topics  . Smoking status: Never Smoker  . Smokeless tobacco: Never Used  . Alcohol use No     Comment: very rarely (1-2/year)  . Drug use: No  . Sexual activity: Not Asked   Other Topics Concern  . None   Social History Narrative   Lives at home with wife, independent at baseline.    Outpatient Encounter Prescriptions as of 01/09/2017  Medication Sig  . aspirin EC 81 MG EC tablet Take 1 tablet (81 mg total) by mouth daily.  Marland Kitchen glucose blood (FREESTYLE LITE) test strip Check blood sugars twice a day (Dx. 250.02)  . levothyroxine (SYNTHROID, LEVOTHROID) 100  MCG tablet TAKE 1 TABLET BY MOUTH  DAILY.  Marland Kitchen losartan (COZAAR) 25 MG tablet Take 1 tablet (25 mg total) by mouth daily.  . pregabalin (LYRICA) 100 MG capsule Take 1 capsule (100 mg total) by mouth 2 (two) times daily.  . SYRINGE-NEEDLE, DISP, 3 ML 25G X 1" 3 ML MISC Use to administer weekly b12 injections, then month after 6 weeks. thanks  . tadalafil (CIALIS) 10 MG tablet Take as directed.  . Testosterone 20.25 MG/ACT (1.62%) GEL 1 pump to each shoulder daily. 2 pumps total.  . fluticasone (FLOVENT HFA) 110 MCG/ACT inhaler Inhale 2 puffs into the lungs 2 (two) times daily. Rinse mouth after use.   No facility-administered encounter medications on file as of 01/09/2017.     Review of Systems  Constitutional: Negative for appetite change and unexpected weight change.  HENT: Negative for congestion and sinus pressure.   Respiratory: Positive for cough. Negative for chest tightness and shortness of breath.   Cardiovascular: Negative for chest pain, palpitations and leg swelling.  Gastrointestinal: Negative for abdominal pain, diarrhea, nausea and vomiting.  Genitourinary: Negative for difficulty urinating and dysuria.  Musculoskeletal: Negative for back pain and joint swelling.  Skin: Negative for color change and  rash.  Neurological: Negative for dizziness, light-headedness and headaches.  Psychiatric/Behavioral: Negative for agitation and dysphoric mood.       Objective:    Physical Exam  Constitutional: He appears well-developed and well-nourished. No distress.  HENT:  Nose: Nose normal.  Mouth/Throat: Oropharynx is clear and moist.  Neck: Neck supple. No thyromegaly present.  Cardiovascular: Normal rate and regular rhythm.   Pulmonary/Chest: Effort normal and breath sounds normal. No respiratory distress.  Abdominal: Soft. Bowel sounds are normal. There is no tenderness.  Musculoskeletal: He exhibits no edema or tenderness.  Lymphadenopathy:    He has no cervical adenopathy.    Skin: No rash noted. No erythema.  Psychiatric: He has a normal mood and affect. His behavior is normal.    BP 118/78 (BP Location: Left Arm, Patient Position: Sitting, Cuff Size: Large)   Pulse 83   Temp 97.6 F (36.4 C) (Oral)   Wt 269 lb (122 kg)   SpO2 96%   BMI 36.48 kg/m  Wt Readings from Last 3 Encounters:  01/09/17 269 lb (122 kg)  11/07/16 262 lb 9.6 oz (119.1 kg)  10/20/16 259 lb (117.5 kg)     Lab Results  Component Value Date   WBC 6.0 10/21/2016   HGB 14.6 10/21/2016   HCT 40.8 10/21/2016   PLT 185 10/21/2016   GLUCOSE 218 (H) 10/21/2016   CHOL 175 10/20/2016   TRIG 420 (H) 10/20/2016   HDL 33 (L) 10/20/2016   LDLCALC UNABLE TO CALCULATE IF TRIGLYCERIDE OVER 400 mg/dL 10/20/2016   ALT 14 (L) 10/20/2016   AST 18 10/20/2016   NA 137 10/21/2016   K 3.7 10/21/2016   CL 106 10/21/2016   CREATININE 0.90 10/21/2016   BUN 14 10/21/2016   CO2 27 10/21/2016   TSH 7.530 (H) 10/20/2016   INR 0.95 10/20/2016   HGBA1C 8.0 (H) 10/20/2016    US Carotid Bilateral  Result Date: 10/21/2016 CLINICAL DATA:  TIA. EXAM: BILATERAL CAROTID DUPLEX ULTRASOUND TECHNIQUE: Pearline Cables scale imaging, color Doppler and duplex ultrasound were performed of bilateral carotid and vertebral arteries in the neck. COMPARISON:  MRI 10/20/2016 . FINDINGS: Criteria: Quantification of carotid stenosis is based on velocity parameters that correlate the residual internal carotid diameter with NASCET-based stenosis levels, using the diameter of the distal internal carotid lumen as the denominator for stenosis measurement. The following velocity measurements were obtained: RIGHT ICA:  87/24 cm/sec CCA:  97/QBH cm/sec SYSTOLIC ICA/CCA RATIO:  1.0 DIASTOLIC ICA/CCA RATIO:  1.2 ECA:  107 cm/sec LEFT ICA:  56/13 cm/sec CCA:  419/37 cm/sec SYSTOLIC ICA/CCA RATIO:  0.5 DIASTOLIC ICA/CCA RATIO:  1.0 ECA:  111 cm/sec RIGHT CAROTID ARTERY: No significant right carotid atherosclerotic vascular disease. No flow limiting  stenosis. RIGHT VERTEBRAL ARTERY:  Patent with antegrade flow. LEFT CAROTID ARTERY: No significant left carotid atherosclerotic vascular disease. No flow limiting stenosis . LEFT VERTEBRAL ARTERY:  Patent with antegrade flow. IMPRESSION: 1. No significant carotid atherosclerotic vascular disease. No flow limiting stenosis. 2.  Vertebral artery is are patent with antegrade flow. Electronically Signed   By: Marcello Moores  Register   On: 10/21/2016 09:55       Assessment & Plan:   Problem List Items Addressed This Visit    Cough    Persistent cough.  Unclear etiology.  Has stopped ace inhibitor.  Changed to ARB.  Start zantac.  Treat allergies.  Saline nasal spray and steroid nasal spray as directed.  Check cxr.  Follow.  Notify me if persistent.  Diabetes ([place])    Poorly controlled.  Discussed today.  Discussed importance of diet and exercise.  Discussed importance of treating his sugars.  He declines medication stating he know he can get it down with diet and exercise.  Discussed effects of untreated sugars.  He continues to decline.  Wants to work on diet and exercise.  Will send in sugar readings over the next few weeks.        Essential hypertension    Now on losartan.  Was concerned that lisinopril possibly contributing to some cough.  Will continue for now.  Titrate if needed.  Follow pressures.  Follow metabolic panel.         Other Visit Diagnoses    Persistent cough    -  Primary   Relevant Orders   DG Chest 2 View (Completed)       [name], MD

## 2017-01-09 NOTE — Patient Instructions (Signed)
Saline nasal spray - flush nose at least 2-3x/day  nasacort nasal spray - 2 sprays each nostril one time per day.  Do this in the evening.    Zantac (ranitidine) 150mg  - take one tablet 30 minutes before breakfast.

## 2017-01-10 ENCOUNTER — Encounter: Payer: Self-pay | Admitting: Internal Medicine

## 2017-01-20 ENCOUNTER — Encounter: Payer: Self-pay | Admitting: Internal Medicine

## 2017-01-20 DIAGNOSIS — R05 Cough: Secondary | ICD-10-CM | POA: Insufficient documentation

## 2017-01-20 DIAGNOSIS — R059 Cough, unspecified: Secondary | ICD-10-CM | POA: Insufficient documentation

## 2017-01-20 NOTE — Assessment & Plan Note (Signed)
Poorly controlled.  Discussed today.  Discussed importance of diet and exercise.  Discussed importance of treating his sugars.  He declines medication stating he know he can get it down with diet and exercise.  Discussed effects of untreated sugars.  He continues to decline.  Wants to work on diet and exercise.  Will send in sugar readings over the next few weeks.

## 2017-01-20 NOTE — Assessment & Plan Note (Signed)
Now on losartan.  Was concerned that lisinopril possibly contributing to some cough.  Will continue for now.  Titrate if needed.  Follow pressures.  Follow metabolic panel.

## 2017-01-20 NOTE — Assessment & Plan Note (Signed)
Persistent cough.  Unclear etiology.  Has stopped ace inhibitor.  Changed to ARB.  Start zantac.  Treat allergies.  Saline nasal spray and steroid nasal spray as directed.  Check cxr.  Follow.  Notify me if persistent.

## 2017-01-22 ENCOUNTER — Telehealth: Payer: Self-pay | Admitting: Internal Medicine

## 2017-01-22 NOTE — Telephone Encounter (Signed)
He needs to get rx from neurology.  In reviewing, they are the ones that refilled the rx in 11/2016.  He just needs to give their office a call.

## 2017-01-22 NOTE — Telephone Encounter (Signed)
See note.  What problem is he having with pharmacy?  I am ok for refill of lyrica if he is due.

## 2017-01-22 NOTE — Telephone Encounter (Signed)
Called patient was sent script for 1 months but needs 90 day supply sent to mail order it is the same cost as 30 day. He states that he is on Lyrica 100 tid You only have at bid. He thinks the Neurology increased it at last o/v

## 2017-01-22 NOTE — Telephone Encounter (Signed)
Pt called to give information on his pregabalin (LYRICA) 100 MG capsule. Mail order pharmacy is giving pt a problem about meds. Please call him at [phone].

## 2017-01-22 NOTE — Telephone Encounter (Signed)
100mg  Lyrica bid  Last given 09/23/16 #60 with 1 rf Last o/v 01/09/17 F/u 12/25/16

## 2017-01-23 NOTE — Telephone Encounter (Signed)
Pt called, I read the note that Dr. Nicki Reaper wrote.He understands and will call his neurologist for refill.

## 2017-01-28 ENCOUNTER — Telehealth: Payer: Self-pay | Admitting: Internal Medicine

## 2017-01-28 DIAGNOSIS — R05 Cough: Secondary | ICD-10-CM

## 2017-01-28 DIAGNOSIS — R053 Chronic cough: Secondary | ICD-10-CM

## 2017-01-28 NOTE — Telephone Encounter (Signed)
Called [facility] told to send request on fax cover sheet with pt name dob and what we needed. Have sent to [phone]. Patient has been informed that we are holding for notes and will let Dr. Nicki Reaper know once received

## 2017-01-28 NOTE — Telephone Encounter (Signed)
Pt called about being told he has acute bronchitis at the [facility] yesterday. And a xray was taken it should an atelectasis. Pt would like to speak to Dr Nicki Reaper. Please advise?  Call pt @ [phone]. Thank you!

## 2017-01-28 NOTE — Telephone Encounter (Signed)
Spoke to patient would like to be sent to who ever you recommend. Would like to stay in the area.

## 2017-01-28 NOTE — Telephone Encounter (Signed)
Reviewed records.  Was started on omnicef.  cxr revealed atelectasis.  (this is where the tiny airways of the lungs collapse on each other - occurs when people are not taking a good breath).  Need info on what he needs.  If he is having persistent cough (because this has been going on for a while), I would like to refer to pulmonary for further evaluation.

## 2017-01-28 NOTE — Telephone Encounter (Signed)
Notes received in your green folder

## 2017-01-28 NOTE — Telephone Encounter (Signed)
Order placed for pulmonary referral.  I have placed a [facility] note to also send along with my notes - for fyi for them.

## 2017-01-29 NOTE — Telephone Encounter (Signed)
Referral has been sent to [facility] and [facility] notes faxed.

## 2017-01-31 ENCOUNTER — Other Ambulatory Visit: Payer: Self-pay | Admitting: Internal Medicine

## 2017-01-31 MED ORDER — LOSARTAN POTASSIUM 25 MG PO TABS: 25.0000 mg | ORAL_TABLET | Freq: Every day | ORAL | 0 refills | Status: DC

## 2017-01-31 NOTE — Telephone Encounter (Signed)
Medication has been refilled.

## 2017-02-16 NOTE — Progress Notes (Signed)
CXR images personally reviewed 01/09/17; unremarkable chest film.

## 2017-02-17 ENCOUNTER — Encounter: Payer: Self-pay | Admitting: Internal Medicine

## 2017-02-17 ENCOUNTER — Ambulatory Visit (INDEPENDENT_AMBULATORY_CARE_PROVIDER_SITE_OTHER): Payer: 59 | Admitting: Internal Medicine

## 2017-02-17 VITALS — BP 126/84 | HR 84 | Resp 16 | Ht 72.0 in | Wt 275.0 lb

## 2017-02-17 DIAGNOSIS — J4541 Moderate persistent asthma with (acute) exacerbation: Secondary | ICD-10-CM | POA: Diagnosis not present

## 2017-02-17 MED ORDER — TIOTROPIUM BROMIDE MONOHYDRATE 2.5 MCG/ACT IN AERS: 2.0000 | INHALATION_SPRAY | Freq: Every day | RESPIRATORY_TRACT | 0 refills | Status: DC

## 2017-02-17 NOTE — Patient Instructions (Addendum)
--  Restart flovent 2 puffs twice daily.   --Restart nasal steroid over the counter twice daily.   --Start over the counter antihistamine once daily.   --Remove pets from bedroom.   --Given sample of spiriva respimat 2.5 mg 2 puffs once daily, if notes that it helps should call us and we will provide a prescription.

## 2017-02-17 NOTE — Progress Notes (Signed)
[facility] Consultation      Assessment and Plan:  Cough, with acute asthmatic bronchitis. -Continue nasal steroid, inhaled steroid. He is given a sample of Spiriva Respimat 2.5 be used for the next week, if he notices this is helps with his chest congestion. He is asked to call us for prescription.  Chronic cough, likely secondary to allergic asthma -As above, continue inhaled steroid and nasal steroid. He notes it allergy symptoms were much better when he lived in [place]. -Recommended that he start taking a daily antihistamine. -Recommended removing pets from the bedroom.  Allergic rhinitis. -Restart nasal steroid.  Obesity with atelectasis. -Recommended weight loss, he is planning on starting diet plan.  Date: 02/17/2017  MRN# [id number] Gregory Crane 06-21-1971   Gregory Crane is a 46 y.o. old male seen in consultation for chief complaint of:    Chief Complaint  Patient presents with  . Advice Only    Referred by Dr. [name]  . Cough    for about 1 year.    HPI:   He notes that up to a year ago he was noticing that when singing to the radio he would get occasional cough. However he got sick about a month ago and the cough was severe he got a course of abx and did not help. He went about a week later and got another abx and got a CXR at that time. He was noted to have atelectasis, he got albuterol which he used 3 times per day.   He notes that he continues to have occasional cough, sometimes he coughs and wheezes but other times her does not. He has not received prednisone but several months ago he got a flovent inhaler and was taken off lisinopril but it did not help.   He was tried on zantac 2 months ago once daily for 2 weeks but did not help. He also tried nasacort 2 sprays in each nostril once daily which did not help. He has a history of seasonal allergies which did not affect him in [place]. He typically get itchy eyes and runny nose in  the spring and early summer. He has 2 dogs in the bedroom in a kennel. They are often in his lap when sitting on the couch.  He had remote allergy but does not recall resutls.   He does not recall taking an antihistamine.   Wife is present and provides some history, he does snore lightly at night.   He is a non-smoker and has never lived with a smoker.   CXR images personally reviewed 01/09/17; unremarkable chest film.    PMHX:   Past Medical History:  Diagnosis Date  . Allergy   . Diabetes mellitus without complication (HCC)    diet controlled  . Hypertension   . Hypothyroidism   . Kidney stones    Surgical Hx:  Past Surgical History:  Procedure Laterality Date  . LUMBAR PUNCTURE     as child  . NO PAST SURGERIES     Family Hx:  Family History  Problem Relation Age of Onset  . Breast cancer Mother   . Diabetes Father   . Diabetes Sister   . Arthritis Maternal Grandmother   . Diabetes Maternal Grandmother    Social Hx:   Social History  Substance Use Topics  . Smoking status: Never Smoker  . Smokeless tobacco: Never Used  . Alcohol use No     Comment: very rarely (1-2/year)  Medication:    Current Outpatient Prescriptions:  .  aspirin EC 81 MG EC tablet, Take 1 tablet (81 mg total) by mouth daily., Disp: 60 tablet, Rfl: 1 .  Cyanocobalamin (VITAMIN B12) 1000 MCG TBCR, Take 1 tablet by mouth daily., Disp: , Rfl:  .  fluticasone (FLOVENT HFA) 110 MCG/ACT inhaler, Inhale 2 puffs into the lungs 2 (two) times daily. Rinse mouth after use., Disp: 1 Inhaler, Rfl: 1 .  glucose blood (FREESTYLE LITE) test strip, Check blood sugars twice a day (Dx. 250.02), Disp: 200 each, Rfl: 1 .  levothyroxine (SYNTHROID, LEVOTHROID) 100 MCG tablet, TAKE 1 TABLET BY MOUTH  DAILY., Disp: 90 tablet, Rfl: 1 .  losartan (COZAAR) 25 MG tablet, Take 1 tablet (25 mg total) by mouth daily., Disp: 90 tablet, Rfl: 0 .  Multiple Vitamins-Minerals (MENS MULTIVITAMIN PLUS PO), Take 1 tablet by  mouth daily., Disp: , Rfl:  .  pregabalin (LYRICA) 100 MG capsule, Take 1 capsule (100 mg total) by mouth 2 (two) times daily. (Patient taking differently: Take 100 mg by mouth 3 (three) times daily. ), Disp: 60 capsule, Rfl: 1 .  SYRINGE-NEEDLE, DISP, 3 ML 25G X 1" 3 ML MISC, Use to administer weekly b12 injections, then month after 6 weeks. thanks, Disp: 100 each, Rfl: 0 .  Testosterone 20.25 MG/ACT (1.62%) GEL, 2 pump to each shoulder daily. 4 pumps total., Disp: , Rfl:    Allergies:  Patient has no known allergies.  Review of Systems: Gen:  Denies  fever, sweats, chills HEENT: Denies blurred vision, double vision. bleeds, sore throat Cvc:  No dizziness, chest pain. Resp:   Denies cough or sputum production, shortness of breath Gi: Denies swallowing difficulty, stomach pain. Gu:  Denies bladder incontinence, burning urine Ext:   No Joint pain, stiffness. Skin: No skin rash,  hives  Endoc:  No polyuria, polydipsia. Psych: No depression, insomnia. Other:  All other systems were reviewed with the patient and were negative other that what is mentioned in the HPI.   Physical Examination:   VS: BP 126/84 (BP Location: Left Arm, Cuff Size: Large)   Pulse 84   Resp 16   Ht 6' (1.829 m)   Wt 275 lb (124.7 kg)   SpO2 96%   BMI 37.30 kg/m   General Appearance: No distress  Neuro:without focal findings,  speech normal,  HEENT: PERRLA, EOM intact.   Pulmonary: normal breath sounds, No wheezing.  CardiovascularNormal S1,S2.  No m/r/g.   Abdomen: Benign, Soft, non-tender. Renal:  No costovertebral tenderness  GU:  No performed at this time. Endoc: No evident thyromegaly, no signs of acromegaly. Skin:   warm, no rashes, no ecchymosis  Extremities: normal, no cyanosis, clubbing.  Other findings:    LABORATORY PANEL:   CBC No results for input(s): WBC, HGB, HCT, PLT in the last 168  hours. ------------------------------------------------------------------------------------------------------------------  Chemistries  No results for input(s): NA, K, CL, CO2, GLUCOSE, BUN, CREATININE, CALCIUM, MG, AST, ALT, ALKPHOS, BILITOT in the last 168 hours.  Invalid input(s): GFRCGP ------------------------------------------------------------------------------------------------------------------  Cardiac Enzymes No results for input(s): TROPONINI in the last 168 hours. ------------------------------------------------------------  RADIOLOGY:  No results found.     Thank  you for the consultation and for allowing [facility] to assist in the care of your patient. Our recommendations are noted above.  Please contact us if we can be of further service.   Marda Stalker, MD.  Board Certified in Internal Medicine, Pulmonary Medicine, [place], and Sleep  Medicine.  [facility] Office Number: [phone]  Patricia Pesa, M.D.  Gregory Crane, M.D  02/17/2017

## 2017-02-17 NOTE — Addendum Note (Signed)
Addended by: Devona Konig on: 02/17/2017 03:51 PM   Modules accepted: Orders

## 2017-03-21 ENCOUNTER — Other Ambulatory Visit: Payer: Self-pay | Admitting: Internal Medicine

## 2017-03-26 LAB — CBC WITH DIFFERENTIAL/PLATELET
Basophils Absolute: 0 10*3/uL (ref 0.0–0.2)
Basos: 1 %
EOS (ABSOLUTE): 0.1 10*3/uL (ref 0.0–0.4)
Eos: 2 %
Hematocrit: 47.8 % (ref 37.5–51.0)
Hemoglobin: 16.3 g/dL (ref 13.0–17.7)
Immature Grans (Abs): 0 10*3/uL (ref 0.0–0.1)
Immature Granulocytes: 0 %
Lymphocytes Absolute: 1.5 10*3/uL (ref 0.7–3.1)
Lymphs: 31 %
MCH: 30.1 pg (ref 26.6–33.0)
MCHC: 34.1 g/dL (ref 31.5–35.7)
MCV: 88 fL (ref 79–97)
Monocytes Absolute: 0.3 10*3/uL (ref 0.1–0.9)
Monocytes: 6 %
Neutrophils Absolute: 2.9 10*3/uL (ref 1.4–7.0)
Neutrophils: 60 %
Platelets: 213 10*3/uL (ref 150–379)
RBC: 5.42 x10E6/uL (ref 4.14–5.80)
RDW: 13.4 % (ref 12.3–15.4)
WBC: 4.8 10*3/uL (ref 3.4–10.8)

## 2017-03-26 LAB — TSH: TSH: 7.97 u[IU]/mL — ABNORMAL HIGH (ref 0.450–4.500)

## 2017-03-26 LAB — BASIC METABOLIC PANEL
BUN/Creatinine Ratio: 18 (ref 9–20)
BUN: 17 mg/dL (ref 6–24)
CO2: 24 mmol/L (ref 20–29)
Calcium: 9.4 mg/dL (ref 8.7–10.2)
Chloride: 103 mmol/L (ref 96–106)
Creatinine, Ser: 0.95 mg/dL (ref 0.76–1.27)
GFR calc Af Amer: 111 mL/min/{1.73_m2} (ref >59)
GFR calc non Af Amer: 96 mL/min/{1.73_m2} (ref >59)
Glucose: 182 mg/dL — ABNORMAL HIGH (ref 65–99)
Potassium: 5 mmol/L (ref 3.5–5.2)
Sodium: 141 mmol/L (ref 134–144)

## 2017-03-26 LAB — HEPATIC FUNCTION PANEL
ALT: 15 IU/L (ref 0–44)
AST: 21 IU/L (ref 0–40)
Albumin: 3.8 g/dL (ref 3.5–5.5)
Alkaline Phosphatase: 63 IU/L (ref 39–117)
Bilirubin Total: 0.3 mg/dL (ref 0.0–1.2)
Bilirubin, Direct: 0.1 mg/dL (ref 0.00–0.40)
Total Protein: 6.7 g/dL (ref 6.0–8.5)

## 2017-03-26 LAB — HEMOGLOBIN A1C
Est. average glucose Bld gHb Est-mCnc: 226 mg/dL
Hgb A1c MFr Bld: 9.5 % — ABNORMAL HIGH (ref 4.8–5.6)

## 2017-03-26 LAB — LIPID PANEL
Chol/HDL Ratio: 5.7 ratio — ABNORMAL HIGH (ref 0.0–5.0)
Cholesterol, Total: 164 mg/dL (ref 100–199)
HDL: 29 mg/dL — ABNORMAL LOW (ref >39)
LDL Calculated: 66 mg/dL (ref 0–99)
Triglycerides: 343 mg/dL — ABNORMAL HIGH (ref 0–149)
VLDL Cholesterol Cal: 69 mg/dL — ABNORMAL HIGH (ref 5–40)

## 2017-03-27 ENCOUNTER — Encounter: Payer: Self-pay | Admitting: Internal Medicine

## 2017-03-27 ENCOUNTER — Ambulatory Visit (INDEPENDENT_AMBULATORY_CARE_PROVIDER_SITE_OTHER): Payer: 59 | Admitting: Internal Medicine

## 2017-03-27 VITALS — BP 130/82 | HR 75 | Temp 98.6°F | Resp 12 | Ht 72.0 in | Wt 265.0 lb

## 2017-03-27 DIAGNOSIS — E11319 Type 2 diabetes mellitus with unspecified diabetic retinopathy without macular edema: Secondary | ICD-10-CM | POA: Diagnosis not present

## 2017-03-27 DIAGNOSIS — Z9109 Other allergy status, other than to drugs and biological substances: Secondary | ICD-10-CM | POA: Diagnosis not present

## 2017-03-27 DIAGNOSIS — R05 Cough: Secondary | ICD-10-CM | POA: Diagnosis not present

## 2017-03-27 DIAGNOSIS — R059 Cough, unspecified: Secondary | ICD-10-CM

## 2017-03-27 DIAGNOSIS — R809 Proteinuria, unspecified: Secondary | ICD-10-CM

## 2017-03-27 DIAGNOSIS — G629 Polyneuropathy, unspecified: Secondary | ICD-10-CM | POA: Diagnosis not present

## 2017-03-27 DIAGNOSIS — I10 Essential (primary) hypertension: Secondary | ICD-10-CM

## 2017-03-27 DIAGNOSIS — E785 Hyperlipidemia, unspecified: Secondary | ICD-10-CM | POA: Diagnosis not present

## 2017-03-27 DIAGNOSIS — Z23 Encounter for immunization: Secondary | ICD-10-CM | POA: Diagnosis not present

## 2017-03-27 DIAGNOSIS — E039 Hypothyroidism, unspecified: Secondary | ICD-10-CM | POA: Diagnosis not present

## 2017-03-27 MED ORDER — LEVOTHYROXINE SODIUM 112 MCG PO TABS: 112.0000 ug | ORAL_TABLET | Freq: Every day | ORAL | 2 refills | Status: DC

## 2017-03-27 MED ORDER — LISINOPRIL 10 MG PO TABS: 10.0000 mg | ORAL_TABLET | Freq: Every day | ORAL | 3 refills | Status: DC

## 2017-03-27 MED ORDER — TETANUS-DIPHTH-ACELL PERTUSSIS 5-2.5-18.5 LF-MCG/0.5 IM SUSP: 0.5000 mL | Freq: Once | INTRAMUSCULAR | 0 refills | Status: AC

## 2017-03-27 MED ORDER — GLUCOSE BLOOD VI STRP: ORAL_STRIP | 1 refills | Status: DC

## 2017-03-27 NOTE — Progress Notes (Signed)
Patient ID: Gregory Crane, male   DOB: 01/31/1971, 46 y.o.   MRN: [id number]   Subjective:    Patient ID: Gregory Crane, male    DOB: 1971-07-19, 46 y.o.   MRN: [id number]  HPI  Patient here for a scheduled follow up.  He reports he is doing relatively well.  We discussed his recent lab results.  a1c 9.5.  Discussed at length with him.  Discussed diet and exercise.  Discussed the need for medication.  Discussed treatment options. He declines medication.  Also discussed his cholesterol labs.  He reports he plans to get more serious about his diet and exercise.  No chest pain.  Breathing stable.  Cough is better/resolved.  He saw pulmonary.  Was placed on steroid nasal spray and spiriva.  Resolved and doing well.  Wants to go back on lisinopril.  Does not feel the cough related.  No acid reflux.  No abdominal pain.  Bowels stable.  He reports his sugars now averaging 120-130 in the am and 103 in the evening.     Past Medical History:  Diagnosis Date  . Allergy   . Diabetes mellitus without complication (HCC)    diet controlled  . Hypertension   . Hypothyroidism   . Kidney stones    Past Surgical History:  Procedure Laterality Date  . LUMBAR PUNCTURE     as child  . NO PAST SURGERIES     Family History  Problem Relation Age of Onset  . Breast cancer Mother   . Diabetes Father   . Diabetes Sister   . Arthritis Maternal Grandmother   . Diabetes Maternal Grandmother    Social History   Social History  . Marital status: Married    Spouse name: N/A  . Number of children: N/A  . Years of education: N/A   Social History Main Topics  . Smoking status: Never Smoker  . Smokeless tobacco: Never Used  . Alcohol use No     Comment: very rarely (1-2/year)  . Drug use: No  . Sexual activity: Not Asked   Other Topics Concern  . None   Social History Narrative   Lives at home with wife, independent at baseline.    Outpatient Encounter Prescriptions as of 03/27/2017  Medication  Sig  . aspirin EC 81 MG EC tablet Take 1 tablet (81 mg total) by mouth daily.  . Cyanocobalamin (VITAMIN B12) 1000 MCG TBCR Take 1 tablet by mouth daily.  . fluticasone (FLOVENT HFA) 110 MCG/ACT inhaler Inhale 2 puffs into the lungs 2 (two) times daily. Rinse mouth after use. (Patient taking differently: Inhale 2 puffs into the lungs 2 (two) times daily. Rinse mouth after use.)  . glucose blood (FREESTYLE LITE) test strip Check blood sugars twice a day (Dx. 250.02)  . Multiple Vitamins-Minerals (MENS MULTIVITAMIN PLUS PO) Take 1 tablet by mouth daily.  . pregabalin (LYRICA) 100 MG capsule Take 1 capsule (100 mg total) by mouth 2 (two) times daily. (Patient taking differently: Take 100 mg by mouth 3 (three) times daily. )  . [DISCONTINUED] glucose blood (FREESTYLE LITE) test strip Check blood sugars twice a day (Dx. 250.02)  . [DISCONTINUED] levothyroxine (SYNTHROID, LEVOTHROID) 100 MCG tablet TAKE 1 TABLET BY MOUTH  DAILY.  . [DISCONTINUED] losartan (COZAAR) 25 MG tablet TAKE 1 TABLET BY MOUTH  DAILY  . levothyroxine (SYNTHROID) 112 MCG tablet Take 1 tablet (112 mcg total) by mouth daily before breakfast.  . lisinopril (PRINIVIL,ZESTRIL) 10 MG tablet Take 1  tablet (10 mg total) by mouth daily.  . [EXPIRED] Tdap (BOOSTRIX) 5-2.5-18.5 LF-MCG/0.5 injection Inject 0.5 mLs into the muscle once.  . Testosterone 20.25 MG/ACT (1.62%) GEL 2 pump to each shoulder daily. 4 pumps total.  . [DISCONTINUED] SYRINGE-NEEDLE, DISP, 3 ML 25G X 1" 3 ML MISC Use to administer weekly b12 injections, then month after 6 weeks. thanks  . [DISCONTINUED] Tiotropium Bromide Monohydrate (SPIRIVA RESPIMAT) 2.5 MCG/ACT AERS Inhale 2 puffs into the lungs daily.   No facility-administered encounter medications on file as of 03/27/2017.     Review of Systems  Constitutional: Negative for appetite change and unexpected weight change.  HENT: Negative for congestion and sinus pressure.   Respiratory: Negative for cough, chest  tightness and shortness of breath.   Cardiovascular: Negative for chest pain, palpitations and leg swelling.  Gastrointestinal: Negative for abdominal pain, diarrhea, nausea and vomiting.  Genitourinary: Negative for difficulty urinating and dysuria.  Musculoskeletal: Negative for back pain and joint swelling.  Skin: Negative for color change and rash.  Neurological: Negative for light-headedness and headaches.       Neuropathy pain.  On lyrica.  Helping.    Psychiatric/Behavioral: Negative for agitation and dysphoric mood.       Objective:    Physical Exam  Constitutional: He appears well-developed and well-nourished. No distress.  HENT:  Nose: Nose normal.  Mouth/Throat: Oropharynx is clear and moist.  Neck: Neck supple. No thyromegaly present.  Cardiovascular: Normal rate and regular rhythm.   Pulmonary/Chest: Effort normal and breath sounds normal. No respiratory distress.  Abdominal: Soft. Bowel sounds are normal. There is no tenderness.  Musculoskeletal: He exhibits no edema or tenderness.  Lymphadenopathy:    He has no cervical adenopathy.  Skin: No rash noted. No erythema.  Psychiatric: He has a normal mood and affect. His behavior is normal.    BP 130/82 (BP Location: Left Arm, Patient Position: Sitting, Cuff Size: Large)   Pulse 75   Temp 98.6 F (37 C) (Oral)   Resp 12   Ht 6' (1.829 m)   Wt 265 lb (120.2 kg)   SpO2 96%   BMI 35.94 kg/m  Wt Readings from Last 3 Encounters:  03/27/17 265 lb (120.2 kg)  02/17/17 275 lb (124.7 kg)  01/09/17 269 lb (122 kg)     Lab Results  Component Value Date   WBC 4.8 03/25/2017   HGB 16.3 03/25/2017   HCT 47.8 03/25/2017   PLT 213 03/25/2017   GLUCOSE 182 (H) 03/25/2017   CHOL 164 03/25/2017   TRIG 343 (H) 03/25/2017   HDL 29 (L) 03/25/2017   LDLCALC 66 03/25/2017   ALT 15 03/25/2017   AST 21 03/25/2017   NA 141 03/25/2017   K 5.0 03/25/2017   CL 103 03/25/2017   CREATININE 0.95 03/25/2017   BUN 17 03/25/2017    CO2 24 03/25/2017   TSH 7.970 (H) 03/25/2017   INR 0.95 10/20/2016   HGBA1C 9.5 (H) 03/25/2017    US Carotid Bilateral  Result Date: 10/21/2016 CLINICAL DATA:  TIA. EXAM: BILATERAL CAROTID DUPLEX ULTRASOUND TECHNIQUE: Pearline Cables scale imaging, color Doppler and duplex ultrasound were performed of bilateral carotid and vertebral arteries in the neck. COMPARISON:  MRI 10/20/2016 . FINDINGS: Criteria: Quantification of carotid stenosis is based on velocity parameters that correlate the residual internal carotid diameter with NASCET-based stenosis levels, using the diameter of the distal internal carotid lumen as the denominator for stenosis measurement. The following velocity measurements were obtained: RIGHT ICA:  87/24  cm/sec CCA:  53/MIW cm/sec SYSTOLIC ICA/CCA RATIO:  1.0 DIASTOLIC ICA/CCA RATIO:  1.2 ECA:  107 cm/sec LEFT ICA:  56/13 cm/sec CCA:  803/21 cm/sec SYSTOLIC ICA/CCA RATIO:  0.5 DIASTOLIC ICA/CCA RATIO:  1.0 ECA:  111 cm/sec RIGHT CAROTID ARTERY: No significant right carotid atherosclerotic vascular disease. No flow limiting stenosis. RIGHT VERTEBRAL ARTERY:  Patent with antegrade flow. LEFT CAROTID ARTERY: No significant left carotid atherosclerotic vascular disease. No flow limiting stenosis . LEFT VERTEBRAL ARTERY:  Patent with antegrade flow. IMPRESSION: 1. No significant carotid atherosclerotic vascular disease. No flow limiting stenosis. 2.  Vertebral artery is are patent with antegrade flow. Electronically Signed   By: Marcello Moores  Register   On: 10/21/2016 09:55       Assessment & Plan:   Problem List Items Addressed This Visit    Cough    Cough resolved.  Saw pulmonary.  Note reviewed.        Diabetes ([place])    Discussed recent labs and elevated a1c.  Discussed treatment.  He declines medications.  Stressed to him the importance of getting the sugars under better control and the need for medication.  He continues to decline.  Wants to work on diet and exercise.  Follow met b and a1c.  Up to date with eye exam.        Relevant Medications   lisinopril (PRINIVIL,ZESTRIL) 10 MG tablet   Other Relevant Orders   Hemoglobin A1c   Environmental allergies    Doing well.  No cough now.  Saw pulmonary.  Note reviewed.       Essential hypertension    He did not feel lisinopril was contributing to cough.  Wants to restart.  Restart lisinopril.  Follow pressures.  Follow metabolic panel.       Relevant Medications   lisinopril (PRINIVIL,ZESTRIL) 10 MG tablet   Other Relevant Orders   TSH   Basic metabolic panel   Hyperlipidemia    Low cholesterol diet and exercise.  Follow lipid panel.        Relevant Medications   lisinopril (PRINIVIL,ZESTRIL) 10 MG tablet   Other Relevant Orders   Hepatic function panel   Lipid panel   Hypothyroidism    On thyroid supplements.  tsh increased.  Increase synthroid to 153mg q day.        Relevant Medications   levothyroxine (SYNTHROID) 112 MCG tablet   Microalbuminuria    Restart lisinopril.        Neuropathy    Was seeing neurology.  On lyrica.  Controlled.         Other Visit Diagnoses    Need for Tdap vaccination    -  Primary       SEinar Pheasant MD

## 2017-03-27 NOTE — Progress Notes (Signed)
Pre-visit discussion using our clinic review tool. No additional management support is needed unless otherwise documented below in the visit note.  

## 2017-03-29 ENCOUNTER — Encounter: Payer: Self-pay | Admitting: Internal Medicine

## 2017-03-29 NOTE — Assessment & Plan Note (Signed)
Doing well.  No cough now.  Saw pulmonary.  Note reviewed.

## 2017-03-29 NOTE — Assessment & Plan Note (Signed)
Low cholesterol diet and exercise.  Follow lipid panel.   

## 2017-03-29 NOTE — Assessment & Plan Note (Signed)
Cough resolved.  Saw pulmonary.  Note reviewed.

## 2017-03-29 NOTE — Assessment & Plan Note (Signed)
Discussed recent labs and elevated a1c.  Discussed treatment.  He declines medications.  Stressed to him the importance of getting the sugars under better control and the need for medication.  He continues to decline.  Wants to work on diet and exercise.  Follow met b and a1c. Up to date with eye exam.

## 2017-03-29 NOTE — Assessment & Plan Note (Signed)
Restart lisinopril 

## 2017-03-29 NOTE — Assessment & Plan Note (Signed)
Was seeing neurology.  On lyrica.  Controlled.

## 2017-03-29 NOTE — Assessment & Plan Note (Signed)
On thyroid supplements.  tsh increased.  Increase synthroid to 110mcg q day.

## 2017-03-29 NOTE — Assessment & Plan Note (Signed)
He did not feel lisinopril was contributing to cough.  Wants to restart.  Restart lisinopril.  Follow pressures.  Follow metabolic panel.

## 2017-04-18 ENCOUNTER — Encounter: Payer: Self-pay | Admitting: Internal Medicine

## 2017-04-18 MED ORDER — LEVOTHYROXINE SODIUM 112 MCG PO TABS: 112.0000 ug | ORAL_TABLET | Freq: Every day | ORAL | 1 refills | Status: DC

## 2017-04-18 MED ORDER — LISINOPRIL 10 MG PO TABS: 10.0000 mg | ORAL_TABLET | Freq: Every day | ORAL | 1 refills | Status: DC

## 2017-04-18 MED ORDER — GLUCOSE BLOOD VI STRP: ORAL_STRIP | 1 refills | Status: DC

## 2017-05-12 ENCOUNTER — Encounter: Payer: Self-pay | Admitting: Internal Medicine

## 2017-05-15 ENCOUNTER — Encounter: Payer: Self-pay | Admitting: Internal Medicine

## 2017-05-26 ENCOUNTER — Telehealth: Payer: Self-pay | Admitting: Internal Medicine

## 2017-05-26 NOTE — Telephone Encounter (Signed)
Placed in red folder  

## 2017-05-26 NOTE — Telephone Encounter (Signed)
Pt wife dropped off Health Screening form to be filled out. Placed in Dr. Bary Leriche folder upfront. Pt will pick up when complete

## 2017-05-27 NOTE — Telephone Encounter (Signed)
Completed and placed in box 

## 2017-05-27 NOTE — Telephone Encounter (Signed)
Left detailed message that form is at reception desk.

## 2017-06-17 ENCOUNTER — Encounter: Payer: Self-pay | Admitting: Internal Medicine

## 2017-06-18 MED ORDER — LOSARTAN POTASSIUM 25 MG PO TABS: 25.0000 mg | ORAL_TABLET | Freq: Every day | ORAL | 1 refills | Status: DC

## 2017-06-18 NOTE — Telephone Encounter (Signed)
rx sent in for losartan #90 with one refill.  Sent to [facility] per pt request.

## 2017-06-19 ENCOUNTER — Ambulatory Visit: Payer: 59 | Admitting: Internal Medicine

## 2017-07-10 ENCOUNTER — Ambulatory Visit: Payer: 59 | Admitting: Internal Medicine

## 2017-08-28 ENCOUNTER — Ambulatory Visit: Payer: 59 | Admitting: Internal Medicine

## 2017-09-10 ENCOUNTER — Encounter: Payer: Self-pay | Admitting: Emergency Medicine

## 2017-09-10 ENCOUNTER — Emergency Department
Admission: EM | Admit: 2017-09-10 | Discharge: 2017-09-10 | Disposition: A | Discharge status: Home / Self Care | Payer: Managed Care, Other (non HMO) | Source: Home / Self Care | Attending: Emergency Medicine | Admitting: Emergency Medicine

## 2017-09-10 ENCOUNTER — Other Ambulatory Visit: Payer: Self-pay

## 2017-09-10 DIAGNOSIS — E039 Hypothyroidism, unspecified: Secondary | ICD-10-CM | POA: Insufficient documentation

## 2017-09-10 DIAGNOSIS — I10 Essential (primary) hypertension: Secondary | ICD-10-CM | POA: Insufficient documentation

## 2017-09-10 DIAGNOSIS — Z8673 Personal history of transient ischemic attack (TIA), and cerebral infarction without residual deficits: Secondary | ICD-10-CM

## 2017-09-10 DIAGNOSIS — R739 Hyperglycemia, unspecified: Secondary | ICD-10-CM

## 2017-09-10 DIAGNOSIS — Z79899 Other long term (current) drug therapy: Secondary | ICD-10-CM | POA: Insufficient documentation

## 2017-09-10 DIAGNOSIS — E1165 Type 2 diabetes mellitus with hyperglycemia: Secondary | ICD-10-CM

## 2017-09-10 DIAGNOSIS — Z7982 Long term (current) use of aspirin: Secondary | ICD-10-CM

## 2017-09-10 DIAGNOSIS — I6381 Other cerebral infarction due to occlusion or stenosis of small artery: Secondary | ICD-10-CM | POA: Diagnosis not present

## 2017-09-10 DIAGNOSIS — I639 Cerebral infarction, unspecified: Secondary | ICD-10-CM | POA: Diagnosis not present

## 2017-09-10 LAB — CBC
HCT: 46.2 % (ref 40.0–52.0)
Hemoglobin: 16 g/dL (ref 13.0–18.0)
MCH: 29.8 pg (ref 26.0–34.0)
MCHC: 34.7 g/dL (ref 32.0–36.0)
MCV: 86 fL (ref 80.0–100.0)
Platelets: 187 10*3/uL (ref 150–440)
RBC: 5.37 MIL/uL (ref 4.40–5.90)
RDW: 12.5 % (ref 11.5–14.5)
WBC: 6.2 10*3/uL (ref 3.8–10.6)

## 2017-09-10 LAB — URINALYSIS, COMPLETE (UACMP) WITH MICROSCOPIC
Bacteria, UA: NONE SEEN
Bilirubin Urine: NEGATIVE
Glucose, UA: >=500 mg/dL — AB
Ketones, ur: 5 mg/dL — AB
Leukocytes, UA: NEGATIVE
Nitrite: NEGATIVE
Protein, ur: 100 mg/dL — AB
Specific Gravity, Urine: 1.025 (ref 1.005–1.030)
Squamous Epithelial / LPF: NONE SEEN
pH: 5 (ref 5.0–8.0)

## 2017-09-10 LAB — BASIC METABOLIC PANEL
Anion gap: 11 (ref 5–15)
BUN: 16 mg/dL (ref 6–20)
CO2: 23 mmol/L (ref 22–32)
Calcium: 8.8 mg/dL — ABNORMAL LOW (ref 8.9–10.3)
Chloride: 101 mmol/L (ref 101–111)
Creatinine, Ser: 1.17 mg/dL (ref 0.61–1.24)
GFR calc Af Amer: >60 mL/min (ref >60)
GFR calc non Af Amer: >60 mL/min (ref >60)
Glucose, Bld: 330 mg/dL — ABNORMAL HIGH (ref 65–99)
Potassium: 4 mmol/L (ref 3.5–5.1)
Sodium: 135 mmol/L (ref 135–145)

## 2017-09-10 MED ORDER — METFORMIN HCL 500 MG PO TABS
500.0000 mg | ORAL_TABLET | Freq: Once | ORAL | Status: AC
  Administered 2017-09-10: 500 mg via ORAL
  Filled 2017-09-10: qty 1

## 2017-09-10 MED ORDER — METFORMIN HCL 500 MG PO TABS: 500.0000 mg | ORAL_TABLET | Freq: Every day | ORAL | 0 refills | Status: DC

## 2017-09-10 MED ORDER — METFORMIN HCL 500 MG PO TABS
ORAL_TABLET | ORAL | Status: AC
  Filled 2017-09-10: qty 1

## 2017-09-10 NOTE — ED Provider Notes (Signed)
[facility] Emergency Department Provider Note  ____________________________________________   First MD Initiated Contact with Patient 09/10/17 1450     (approximate)  I have reviewed the triage vital signs and the nursing notes.   HISTORY  Chief Complaint Hyperglycemia   HPI Gregory Crane is a 47 y.o. male with a history of diet-controlled diabetes he says that he is feeling vague sensations of "wonkiness" over the past 24-48 hours.  He says that he is also urinating more frequently.  Denies any nausea, vomiting or abdominal pain.  Says that ever since the holiday studies he has been eating poorly.  He does not exercise.  He says that he was previously on metformin but due to diet controlling his diabetes he was able to go off of metformin.  He says that he also drinks soda.  He is concerned because he said that yesterday sugars in the 400s however it is improved to 300s today.   Past Medical History:  Diagnosis Date  . Allergy   . Diabetes mellitus without complication (HCC)    diet controlled  . Hypertension   . Hypothyroidism   . Kidney stones     Patient Active Problem List   Diagnosis Date Noted  . Cough 01/20/2017  . Bell's palsy 11/10/2016  . History of CVA (cerebrovascular accident) 11/10/2016  . 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 History of nephrolithiasis 10/27/2016  . TIA (transient ischemic attack) 10/20/2016  . Near syncope 06/23/2016  . 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 Neuropathy 08/06/2015  . Health care maintenance 08/06/2015  . Essential hypertension 08/06/2015  . Heme positive stool 10/10/2013  . Hypothyroidism 10/10/2013  . Microalbuminuria 10/10/2013  . Hyperlipidemia 10/10/2013  . B12 deficiency 10/10/2013  . Diabetes ([place]) 07/04/2013  . Environmental allergies 07/04/2013    Past Surgical History:  Procedure Laterality Date  . LUMBAR PUNCTURE     as child  . NO PAST  SURGERIES      Prior to Admission medications   Medication Sig Start Date End Date Taking? Authorizing Provider  aspirin EC 81 MG EC tablet Take 1 tablet (81 mg total) by mouth daily. 10/22/16   Henreitta Leber, MD  Cyanocobalamin (VITAMIN B12) 1000 MCG TBCR Take 1 tablet by mouth daily.    [provider]  fluticasone (FLOVENT HFA) 110 MCG/ACT inhaler Inhale 2 puffs into the lungs 2 (two) times daily. Rinse mouth after use. Patient taking differently: Inhale 2 puffs into the lungs 2 (two) times daily. Rinse mouth after use. 01/09/17   [name], MD  glucose blood (FREESTYLE LITE) test strip Check blood sugars twice a day (Dx. 250.02) 04/18/17   [name], MD  levothyroxine (SYNTHROID) 112 MCG tablet Take 1 tablet (112 mcg total) by mouth daily before breakfast. 04/18/17   [name], MD  losartan (COZAAR) 25 MG tablet Take 1 tablet (25 mg total) by mouth daily. 06/18/17   [name], MD  Multiple Vitamins-Minerals (MENS MULTIVITAMIN PLUS PO) Take 1 tablet by mouth daily.    [provider]  pregabalin (LYRICA) 100 MG capsule Take 1 capsule (100 mg total) by mouth 2 (two) times daily. Patient taking differently: Take 100 mg by mouth 3 (three) times daily.  09/23/16   [name], MD  Testosterone 20.25 MG/ACT (1.62%) GEL 2 pump to each shoulder daily. 4 pumps total. 10/28/16 03/26/17  [provider]    Allergies Patient has no known allergies.  Family History  Problem Relation Age of Onset  . Breast cancer Mother   . Diabetes Father   . Diabetes Sister   . Arthritis Maternal Grandmother   . Diabetes Maternal Grandmother     Social History Social History   Tobacco Use  . Smoking status: Never Smoker  . Smokeless tobacco: Never Used  Substance Use Topics  . Alcohol use: No    Alcohol/week: 0.0 oz    Comment: very rarely (1-2/year)  . Drug use: No    Review of Systems  Constitutional: No fever/chills Eyes: No visual  changes. ENT: No sore throat. Cardiovascular: Denies chest pain. Respiratory: Denies shortness of breath. Gastrointestinal: No abdominal pain.  No nausea, no vomiting.  No diarrhea.  No constipation. Genitourinary: Negative for dysuria. Musculoskeletal: Negative for back pain. Skin: Negative for rash. Neurological: Negative for headaches, focal weakness or numbness.   ____________________________________________   PHYSICAL EXAM:  VITAL SIGNS: ED Triage Vitals  Enc Vitals Group     BP 09/10/17 1136 (!) 173/90     Pulse Rate 09/10/17 1136 80     Resp 09/10/17 1136 20     Temp 09/10/17 1136 97.8 F (36.6 C)     Temp Source 09/10/17 1136 Oral     SpO2 09/10/17 1136 97 %     Weight 09/10/17 1139 270 lb (122.5 kg)     Height 09/10/17 1139 6' (1.829 m)     Head Circumference --      Peak Flow --      Pain Score --      Pain Loc --      Pain Edu? --      Excl. in [place]? --     Constitutional: Alert and oriented. Well appearing and in no acute distress. Eyes: Conjunctivae are normal.  Head: Atraumatic. Nose: No congestion/rhinnorhea. Mouth/Throat: Mucous membranes are moist.  Neck: No stridor.   Cardiovascular: Normal rate, regular rhythm. Grossly normal heart sounds.   Respiratory: Normal respiratory effort.  No retractions. Lungs CTAB. Gastrointestinal: Soft and nontender. No distention.  Musculoskeletal: No lower extremity tenderness nor edema.  No joint effusions. Neurologic:  Normal speech and language. No gross focal neurologic deficits are appreciated. Skin:  Skin is warm, dry and intact. No rash noted. Psychiatric: Mood and affect are normal. Speech and behavior are normal.  ____________________________________________   LABS (all labs ordered are listed, but only abnormal results are displayed)  Labs Reviewed  BASIC METABOLIC PANEL - Abnormal; Notable for the following components:      Result Value   Glucose, Bld 330 (*)    Calcium 8.8 (*)    All other  components within normal limits  URINALYSIS, COMPLETE (UACMP) WITH MICROSCOPIC - Abnormal; Notable for the following components:   Color, Urine YELLOW (*)    APPearance CLEAR (*)    Glucose, UA >=500 (*)    Hgb urine dipstick SMALL (*)    Ketones, ur 5 (*)    Protein, ur 100 (*)    All other components within normal limits  CBC  CBG MONITORING, ED   ____________________________________________  EKG   ____________________________________________  RADIOLOGY   ____________________________________________   PROCEDURES  Procedure(s) performed:   Procedures  Critical Care performed:   ____________________________________________   INITIAL IMPRESSION / ASSESSMENT AND PLAN / ED COURSE  Pertinent labs & imaging results that were available during my care of the patient were reviewed by me and considered in my medical decision making (see chart for details).  Differential diagnosis includes, but  is not limited to, alcohol, illicit or prescription medications, or other toxic ingestion; intracranial pathology such as stroke or intracerebral hemorrhage; fever or infectious causes including sepsis; hypoxemia and/or hypercarbia; uremia; trauma; endocrine related disorders such as diabetes, hypoglycemia, and thyroid-related diseases; hypertensive encephalopathy; etc.  As part of my medical decision making, I reviewed the following data within the [place] reviewed glucose over the past year was reviewed  Patient with hyperglycemia today but without signs and symptoms of DKA.  Normal anion gap.  Normal bicarbonate.  I recommended to the patient to exercise as well as to improve his diet and eat less sugar.  He also knows to drink plenty of water.  Feeling that he is likely mildly dehydrated which is causing him today.  We will also start him back on metformin, 500 mg daily, and he will follow-up with his primary care doctor.  He is understanding of this plan willing  to comply.      ____________________________________________   FINAL CLINICAL IMPRESSION(S) / ED DIAGNOSES  Hyperglycemia.    NEW MEDICATIONS STARTED DURING THIS VISIT:  New Prescriptions   No medications on file     Note:  This document was prepared using Dragon voice recognition software and may include unintentional dictation errors.     Orbie Pyo, MD 09/10/17 1534

## 2017-09-10 NOTE — ED Triage Notes (Signed)
Pt reports that his blood sugar was in the 400's and felt "wonkey" He states that at dinner it went down to the 300's States that he doesn't take any medication for his blood sugar just diet control.

## 2017-09-12 ENCOUNTER — Ambulatory Visit: Payer: Self-pay | Admitting: *Deleted

## 2017-09-12 ENCOUNTER — Inpatient Hospital Stay
Admission: EM | Admit: 2017-09-12 | Discharge: 2017-09-14 | DRG: 066 | Disposition: A | Discharge status: Home / Self Care | Payer: Managed Care, Other (non HMO) | Attending: Internal Medicine | Admitting: Internal Medicine

## 2017-09-12 ENCOUNTER — Emergency Department: Payer: Managed Care, Other (non HMO)

## 2017-09-12 DIAGNOSIS — Z87442 Personal history of urinary calculi: Secondary | ICD-10-CM | POA: Diagnosis not present

## 2017-09-12 DIAGNOSIS — I5030 Unspecified diastolic (congestive) heart failure: Secondary | ICD-10-CM | POA: Diagnosis not present

## 2017-09-12 DIAGNOSIS — Z803 Family history of malignant neoplasm of breast: Secondary | ICD-10-CM

## 2017-09-12 DIAGNOSIS — Z79899 Other long term (current) drug therapy: Secondary | ICD-10-CM

## 2017-09-12 DIAGNOSIS — I639 Cerebral infarction, unspecified: Secondary | ICD-10-CM | POA: Diagnosis present

## 2017-09-12 DIAGNOSIS — I10 Essential (primary) hypertension: Secondary | ICD-10-CM | POA: Diagnosis present

## 2017-09-12 DIAGNOSIS — I679 Cerebrovascular disease, unspecified: Secondary | ICD-10-CM | POA: Diagnosis present

## 2017-09-12 DIAGNOSIS — Z7951 Long term (current) use of inhaled steroids: Secondary | ICD-10-CM | POA: Diagnosis not present

## 2017-09-12 DIAGNOSIS — Z7984 Long term (current) use of oral hypoglycemic drugs: Secondary | ICD-10-CM

## 2017-09-12 DIAGNOSIS — Z79890 Hormone replacement therapy (postmenopausal): Secondary | ICD-10-CM | POA: Diagnosis not present

## 2017-09-12 DIAGNOSIS — Z833 Family history of diabetes mellitus: Secondary | ICD-10-CM

## 2017-09-12 DIAGNOSIS — I6381 Other cerebral infarction due to occlusion or stenosis of small artery: Secondary | ICD-10-CM | POA: Diagnosis present

## 2017-09-12 DIAGNOSIS — E538 Deficiency of other specified B group vitamins: Secondary | ICD-10-CM | POA: Diagnosis present

## 2017-09-12 DIAGNOSIS — R29700 NIHSS score 0: Secondary | ICD-10-CM | POA: Diagnosis present

## 2017-09-12 DIAGNOSIS — Z8673 Personal history of transient ischemic attack (TIA), and cerebral infarction without residual deficits: Secondary | ICD-10-CM

## 2017-09-12 DIAGNOSIS — E1165 Type 2 diabetes mellitus with hyperglycemia: Secondary | ICD-10-CM | POA: Diagnosis present

## 2017-09-12 DIAGNOSIS — E039 Hypothyroidism, unspecified: Secondary | ICD-10-CM | POA: Diagnosis present

## 2017-09-12 DIAGNOSIS — E1140 Type 2 diabetes mellitus with diabetic neuropathy, unspecified: Secondary | ICD-10-CM | POA: Diagnosis present

## 2017-09-12 DIAGNOSIS — T39016A Underdosing of aspirin, initial encounter: Secondary | ICD-10-CM | POA: Diagnosis present

## 2017-09-12 DIAGNOSIS — E785 Hyperlipidemia, unspecified: Secondary | ICD-10-CM | POA: Diagnosis present

## 2017-09-12 DIAGNOSIS — Z6837 Body mass index (BMI) 37.0-37.9, adult: Secondary | ICD-10-CM | POA: Diagnosis not present

## 2017-09-12 DIAGNOSIS — E669 Obesity, unspecified: Secondary | ICD-10-CM | POA: Diagnosis present

## 2017-09-12 HISTORY — DX: Cerebral infarction, unspecified: I63.9

## 2017-09-12 HISTORY — DX: Bell's palsy: G51.0

## 2017-09-12 LAB — CBC
HCT: 46.9 % (ref 40.0–52.0)
Hemoglobin: 16 g/dL (ref 13.0–18.0)
MCH: 29.9 pg (ref 26.0–34.0)
MCHC: 34.2 g/dL (ref 32.0–36.0)
MCV: 87.5 fL (ref 80.0–100.0)
Platelets: 220 10*3/uL (ref 150–440)
RBC: 5.36 MIL/uL (ref 4.40–5.90)
RDW: 12.7 % (ref 11.5–14.5)
WBC: 6.8 10*3/uL (ref 3.8–10.6)

## 2017-09-12 LAB — URINALYSIS, COMPLETE (UACMP) WITH MICROSCOPIC
Bacteria, UA: NONE SEEN
Bilirubin Urine: NEGATIVE
Glucose, UA: >=500 mg/dL — AB
Ketones, ur: 20 mg/dL — AB
Leukocytes, UA: NEGATIVE
Nitrite: NEGATIVE
Protein, ur: 100 mg/dL — AB
Specific Gravity, Urine: 1.02 (ref 1.005–1.030)
pH: 5 (ref 5.0–8.0)

## 2017-09-12 LAB — COMPREHENSIVE METABOLIC PANEL
ALT: 15 U/L — ABNORMAL LOW (ref 17–63)
AST: 20 U/L (ref 15–41)
Albumin: 3.6 g/dL (ref 3.5–5.0)
Alkaline Phosphatase: 80 U/L (ref 38–126)
Anion gap: 11 (ref 5–15)
BUN: 21 mg/dL — ABNORMAL HIGH (ref 6–20)
CO2: 24 mmol/L (ref 22–32)
Calcium: 9.3 mg/dL (ref 8.9–10.3)
Chloride: 101 mmol/L (ref 101–111)
Creatinine, Ser: 1.15 mg/dL (ref 0.61–1.24)
GFR calc Af Amer: >60 mL/min (ref >60)
GFR calc non Af Amer: >60 mL/min (ref >60)
Glucose, Bld: 265 mg/dL — ABNORMAL HIGH (ref 65–99)
Potassium: 4.7 mmol/L (ref 3.5–5.1)
Sodium: 136 mmol/L (ref 135–145)
Total Bilirubin: 1 mg/dL (ref 0.3–1.2)
Total Protein: 7.5 g/dL (ref 6.5–8.1)

## 2017-09-12 LAB — DIFFERENTIAL
Basophils Absolute: 0 10*3/uL (ref 0–0.1)
Basophils Relative: 0 %
Eosinophils Absolute: 0.1 10*3/uL (ref 0–0.7)
Eosinophils Relative: 1 %
Lymphocytes Relative: 23 %
Lymphs Abs: 1.6 10*3/uL (ref 1.0–3.6)
Monocytes Absolute: 0.4 10*3/uL (ref 0.2–1.0)
Monocytes Relative: 7 %
Neutro Abs: 4.7 10*3/uL (ref 1.4–6.5)
Neutrophils Relative %: 69 %

## 2017-09-12 LAB — APTT: aPTT: 29 seconds (ref 24–36)

## 2017-09-12 LAB — PROTIME-INR
INR: 0.96
Prothrombin Time: 12.7 seconds (ref 11.4–15.2)

## 2017-09-12 LAB — GLUCOSE, CAPILLARY: Glucose-Capillary: 229 mg/dL — ABNORMAL HIGH (ref 65–99)

## 2017-09-12 LAB — TROPONIN I: Troponin I: <0.03 ng/mL (ref <0.03)

## 2017-09-12 MED ORDER — ONDANSETRON HCL 4 MG/2ML IJ SOLN: 4.0000 mg | Freq: Four times a day (QID) | INTRAMUSCULAR | Status: DC | PRN

## 2017-09-12 MED ORDER — FLUTICASONE PROPIONATE HFA 110 MCG/ACT IN AERO: 2.0000 | INHALATION_SPRAY | Freq: Two times a day (BID) | RESPIRATORY_TRACT | Status: DC

## 2017-09-12 MED ORDER — SODIUM CHLORIDE 0.9 % IV BOLUS (SEPSIS)
1000.0000 mL | Freq: Once | INTRAVENOUS | Status: AC
  Administered 2017-09-12: 1000 mL via INTRAVENOUS

## 2017-09-12 MED ORDER — DOCUSATE SODIUM 100 MG PO CAPS
100.0000 mg | ORAL_CAPSULE | Freq: Two times a day (BID) | ORAL | Status: DC
  Administered 2017-09-13 – 2017-09-14 (×3): 100 mg via ORAL
  Filled 2017-09-12 (×3): qty 1

## 2017-09-12 MED ORDER — ONDANSETRON HCL 4 MG PO TABS: 4.0000 mg | ORAL_TABLET | Freq: Four times a day (QID) | ORAL | Status: DC | PRN

## 2017-09-12 MED ORDER — METFORMIN HCL 500 MG PO TABS
500.0000 mg | ORAL_TABLET | Freq: Every day | ORAL | Status: DC
  Administered 2017-09-13 – 2017-09-14 (×2): 500 mg via ORAL
  Filled 2017-09-12 (×2): qty 1

## 2017-09-12 MED ORDER — LOSARTAN POTASSIUM 50 MG PO TABS
25.0000 mg | ORAL_TABLET | Freq: Every day | ORAL | Status: DC
  Administered 2017-09-13 – 2017-09-14 (×2): 25 mg via ORAL
  Filled 2017-09-12 (×2): qty 1

## 2017-09-12 MED ORDER — ACETAMINOPHEN 325 MG PO TABS: 650.0000 mg | ORAL_TABLET | Freq: Four times a day (QID) | ORAL | Status: DC | PRN

## 2017-09-12 MED ORDER — LEVOTHYROXINE SODIUM 112 MCG PO TABS
112.0000 ug | ORAL_TABLET | Freq: Every day | ORAL | Status: DC
  Administered 2017-09-13 – 2017-09-14 (×2): 112 ug via ORAL
  Filled 2017-09-12 (×2): qty 1

## 2017-09-12 MED ORDER — BISACODYL 5 MG PO TBEC: 5.0000 mg | DELAYED_RELEASE_TABLET | Freq: Every day | ORAL | Status: DC | PRN

## 2017-09-12 MED ORDER — ACETAMINOPHEN 650 MG RE SUPP: 650.0000 mg | Freq: Four times a day (QID) | RECTAL | Status: DC | PRN

## 2017-09-12 MED ORDER — HYDROCODONE-ACETAMINOPHEN 5-325 MG PO TABS: 1.0000 | ORAL_TABLET | ORAL | Status: DC | PRN

## 2017-09-12 MED ORDER — PREGABALIN 50 MG PO CAPS
100.0000 mg | ORAL_CAPSULE | Freq: Two times a day (BID) | ORAL | Status: DC
  Administered 2017-09-13 (×3): 100 mg via ORAL
  Filled 2017-09-12 (×3): qty 2

## 2017-09-12 MED ORDER — HEPARIN SODIUM (PORCINE) 5000 UNIT/ML IJ SOLN
5000.0000 [IU] | Freq: Three times a day (TID) | INTRAMUSCULAR | Status: DC
  Administered 2017-09-13 – 2017-09-14 (×5): 5000 [IU] via SUBCUTANEOUS
  Filled 2017-09-12 (×5): qty 1

## 2017-09-12 NOTE — Telephone Encounter (Signed)
Talked with [facility] scheduled patient ED follow up with PCP next Tuesday, but patient presenting with slurred speech , head feeling foggy advised needs to return to ED today. Patient is having Co-worker drive to ED now.

## 2017-09-12 NOTE — Telephone Encounter (Signed)
Pt reports CBG Tuesday (09/09/17) afternoon 447; evening 340. Continued in 300's  throughout Wednesday states "felt weird, can't describe." Went to ED, blood glucose 330.  Placed on Metformin 500mg . Has taken each AM since. Reports CBG of 267 this afternoon. States "Feels foggy, can't concentrate, lightheaded having to hold onto things to ambulate at times." Also reports increased weakness, intermittent slurred speech. Speech clear during triage call, A&O  X 4. Denies any rapid breathing, change in respirations.  Pt has been managed on diet only in past (Type 2) Instructed to go to ED. Flow coordinator "Juliann Pulse" called, appt made with Dr. Nicki Reaper for Tuesday 09/16/17 for F/U.  Pt's co-worker will drive him to ED now. Reason for Disposition . [1] Blood glucose > 300 mg/dl (16.5 mmol/l) AND [2] two or more times in a row  Answer Assessment - Initial Assessment Questions 1. BLOOD GLUCOSE: "What is your blood glucose level?"      267 2. ONSET: "When did you check the blood glucose?"     1300 today 3. USUAL RANGE: "What is your glucose level usually?" (e.g., usual fasting morning value, usual evening value)     115-120. "If not eating right high 100's" 4. KETONES: "Do you check for ketones (urine or blood test strips)?" If yes, ask: "What does the test show now?"      no 5. TYPE 1 or 2:  "Do you know what type of diabetes you have?"  (e.g., Type 1, Type 2, Gestational; doesn't know)      Type 2  6. INSULIN: "Do you take insulin?" If yes, ask: "Have you missed any shots recently?"     no 7. DIABETES PILLS: "Do you take any pills for your diabetes?" If yes, ask: "Have you missed taking any pills recently?"     Went to ED Wednesday for BS of 447. Placed on Metformin 500mg . Has taken each morning , Wedn  Thurs and this morning. 8. OTHER SYMPTOMS: "Do you have any symptoms?" (e.g., fever, frequent urination, difficulty breathing, dizziness, weakness, vomiting)    "Feel foggy, lightheaded,having to hold onto  things to ambulate sometimes Hard to concentrate on things. Slurred speech at times per pt. Urinating more than usual. Increased weakness."  Protocols used: DIABETES - HIGH BLOOD SUGAR-A-AH

## 2017-09-12 NOTE — ED Provider Notes (Signed)
[facility] Emergency Department Provider Note  ____________________________________________   I have reviewed the triage vital signs and the nursing notes. Where available I have reviewed prior notes and, if possible and indicated, outside hospital notes.    HISTORY  Chief Complaint Dizziness and Altered Mental Status    HPI Gregory Crane is a 47 y.o. male who has a history of questionable stroke activity approximately 1 year ago with a very largely negative workup, also history of "diet-controlled" diabetes, he noticed his sugar was elevated a few days ago.  Actually checked his sugars because he was feeling "wonky" this is difficult for him to describe.  He does not have exactly word finding difficulty but he feels a little bit confused, he does not have any focal numbness or weakness but he feels that he is misjudging doorways when he walks etc.  He is able to walk with no difficulty however.  He does not have a headache, chest pain shortness breath nausea vomiting diarrhea abdominal pain dysuria urinary frequency seems to be better after his last visit, he had not checked his glucose and months he states.  He was seen here 2 days ago and appropriately worked up, was started on Metformin which she has been taking and his sugars have been running better.  He has no idea is what they were prior to checking it when he felt "walking" a couple days ago on Tuesday.  After going home he was hoping a refill much better but his symptoms persist they have not worsened, if anything they may be slightly better but he still feels "not quite myself".  Nothing makes it better nothing makes it worse,   Past Medical History:  Diagnosis Date  . Allergy   . Bell's palsy   . Diabetes mellitus without complication (HCC)    diet controlled  . Hypertension   . Hypothyroidism   . Kidney stones   . Stroke [facility])     Patient Active Problem List   Diagnosis Date Noted  . Cough  01/20/2017  . Bell's palsy 11/10/2016  . History of CVA (cerebrovascular accident) 11/10/2016  . 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 History of nephrolithiasis 10/27/2016  . TIA (transient ischemic attack) 10/20/2016  . Near syncope 06/23/2016  . 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 Neuropathy 08/06/2015  . Health care maintenance 08/06/2015  . Essential hypertension 08/06/2015  . Heme positive stool 10/10/2013  . Hypothyroidism 10/10/2013  . Microalbuminuria 10/10/2013  . Hyperlipidemia 10/10/2013  . B12 deficiency 10/10/2013  . Diabetes ([place]) 07/04/2013  . Environmental allergies 07/04/2013    Past Surgical History:  Procedure Laterality Date  . LUMBAR PUNCTURE     as child  . NO PAST SURGERIES      Prior to Admission medications   Medication Sig Start Date End Date Taking? Authorizing Provider  aspirin EC 81 MG EC tablet Take 1 tablet (81 mg total) by mouth daily. 10/22/16   Henreitta Leber, MD  Cyanocobalamin (VITAMIN B12) 1000 MCG TBCR Take 1 tablet by mouth daily.    [provider]  fluticasone (FLOVENT HFA) 110 MCG/ACT inhaler Inhale 2 puffs into the lungs 2 (two) times daily. Rinse mouth after use. Patient taking differently: Inhale 2 puffs into the lungs 2 (two) times daily. Rinse mouth after use. 01/09/17   [name], MD  glucose blood (FREESTYLE LITE) test strip Check blood sugars twice a day (Dx. 250.02) 04/18/17   [name], MD  levothyroxine (SYNTHROID) 112 MCG tablet Take 1 tablet (112 mcg total) by mouth daily before breakfast. 04/18/17   [name], MD  losartan (COZAAR) 25 MG tablet Take 1 tablet (25 mg total) by mouth daily. 06/18/17   [name], MD  metFORMIN (GLUCOPHAGE) 500 MG tablet Take 1 tablet (500 mg total) by mouth daily with breakfast. 09/10/17 09/10/18  Orbie Pyo, MD  Multiple Vitamins-Minerals (MENS MULTIVITAMIN PLUS PO) Take 1 tablet by mouth daily.    [provider]  pregabalin (LYRICA) 100 MG capsule Take 1 capsule (100 mg total) by mouth 2 (two) times daily. Patient taking differently: Take 100 mg by mouth 3 (three) times daily.  09/23/16   [name], MD  Testosterone 20.25 MG/ACT (1.62%) GEL 2 pump to each shoulder daily. 4 pumps total. 10/28/16 03/26/17  [provider]    Allergies Patient has no known allergies.  Family History  Problem Relation Age of Onset  . Breast cancer Mother   . Diabetes Father   . Diabetes Sister   . Arthritis Maternal Grandmother   . Diabetes Maternal Grandmother     Social History Social History   Tobacco Use  . Smoking status: Never Smoker  . Smokeless tobacco: Never Used  Substance Use Topics  . Alcohol use: No    Alcohol/week: 0.0 oz    Comment: very rarely (1-2/year)  . Drug use: No    Review of Systems Constitutional: No fever/chills Eyes: No visual changes. ENT: No sore throat. No stiff neck no neck pain Cardiovascular: Denies chest pain. Respiratory: Denies shortness of breath. Gastrointestinal:   no vomiting.  No diarrhea.  No constipation. Genitourinary: Negative for dysuria. Musculoskeletal: Negative lower extremity swelling Skin: Negative for rash. Neurological: Negative for severe headaches, focal weakness or numbness.   ____________________________________________   PHYSICAL EXAM:  VITAL SIGNS: ED Triage Vitals [09/12/17 1433]  Enc Vitals Group     BP (!) 142/71     Pulse Rate 93     Resp 20     Temp 98.5 F (36.9 C)     Temp Source Oral     SpO2 96 %     Weight 272 lb (123.4 kg)     Height 6' (1.829 m)     Head Circumference      Peak Flow      Pain Score      Pain Loc      Pain Edu?      Excl. in [place]?     Constitutional: Alert and oriented. Well appearing and in no acute distress. Eyes: Conjunctivae are normal Head: Atraumatic HEENT: No congestion/rhinnorhea. Mucous membranes are moist.  Oropharynx non-erythematous Neck:   Nontender  with no meningismus, no masses, no stridor Cardiovascular: Normal rate, regular rhythm. Grossly normal heart sounds.  Good peripheral circulation. Respiratory: Normal respiratory effort.  No retractions. Lungs CTAB. Abdominal: Soft and nontender. No distention. No guarding no rebound Back:  There is no focal tenderness or step off.  there is no midline tenderness there are no lesions noted. there is no CVA tenderness Musculoskeletal: No lower extremity tenderness, no upper extremity tenderness. No joint effusions, no DVT signs strong distal pulses no edema Neurologic: Cranial nerves II through XII are grossly intact 5 out of 5 strength bilateral upper and lower extremity. Finger to nose within normal limits heel to shin within normal limits, speech is normal with no word finding difficulty or dysarthria, reflexes symmetric, pupils are equally round and reactive to light, there  is no pronator drift, sensation is normal, vision is intact to confrontation, gait is deferred, there is no nystagmus, normal neurologic exam Skin:  Skin is warm, dry and intact. No rash noted. Psychiatric: Mood and affect are normal. Speech and behavior are normal.  ____________________________________________   LABS (all labs ordered are listed, but only abnormal results are displayed)  Labs Reviewed  COMPREHENSIVE METABOLIC PANEL - Abnormal; Notable for the following components:      Result Value   Glucose, Bld 265 (*)    BUN 21 (*)    ALT 15 (*)    All other components within normal limits  GLUCOSE, CAPILLARY - Abnormal; Notable for the following components:   Glucose-Capillary 229 (*)    All other components within normal limits  CBC  DIFFERENTIAL  TROPONIN I  PROTIME-INR  APTT  URINALYSIS, COMPLETE (UACMP) WITH MICROSCOPIC  CBG MONITORING, ED    Pertinent labs  results that were available during my care of the patient were reviewed by me and considered in my medical decision making (see chart for  details). ____________________________________________  EKG  I personally interpreted any EKGs ordered by me or triage  ____________________________________________  RADIOLOGY  Pertinent labs & imaging results that were available during my care of the patient were reviewed by me and considered in my medical decision making (see chart for details). If possible, patient and/or family made aware of any abnormal findings.  Dg Chest 2 View  Result Date: 09/12/2017 CLINICAL DATA:  Nonproductive cough 3-4 weeks EXAM: CHEST  2 VIEW COMPARISON:  01/09/2017 FINDINGS: The heart size and mediastinal contours are within normal limits. Both lungs are clear. The visualized skeletal structures are unremarkable. IMPRESSION: No active cardiopulmonary disease. Electronically Signed   By: Franchot Gallo M.D.   On: 09/12/2017 18:15   Ct Head Wo Contrast  Result Date: 09/12/2017 CLINICAL DATA:  Slurred speech, confusion, dizziness and altered level of consciousness on 09/10/2007. EXAM: CT HEAD WITHOUT CONTRAST TECHNIQUE: Contiguous axial images were obtained from the base of the skull through the vertex without intravenous contrast. COMPARISON:  Brain MRI and head CT 10/20/2016. FINDINGS: Brain: There is a new focus of hypoattenuation in the periventricular deep white matter on the right most consistent with a deep white matter infarct which appears remote. Tiny lacunar infarction in the left basal ganglia is unchanged. No hemorrhage, midline shift, mass lesion, mass effect or abnormal extra-axial fluid collection. No hydrocephalus or pneumocephalus. Vascular: No hyperdense vessel or unexpected calcification. Skull: Intact. Sinuses/Orbits: Negative. Other: None. IMPRESSION: Since the prior exams, the patient has suffered a periventricular deep white matter infarct on the right. The infarct appears remote. No acute abnormality is identified. Electronically Signed   By: Inge Rise M.D.   On: 09/12/2017 15:29    ____________________________________________    PROCEDURES  Procedure(s) performed: None  Procedures  Critical Care performed: None  ____________________________________________   INITIAL IMPRESSION / ASSESSMENT AND PLAN / ED COURSE  Pertinent labs & imaging results that were available during my care of the patient were reviewed by me and considered in my medical decision making (see chart for details).  Here because he feels vaguely unwell in the context of poorly controlled diabetes.  This certainly could be a metabolic pathology should be able to long-term and more acutely poorly controlled DM.  Sugars are still in the 200 range which is certainly an improvement.  He may be also slightly dehydrated from this and we are giving him IV fluid.  Blood work  and urinalysis from prior visit were reassuring I did a CT scan which shows questionable old infarct, nothing acute.  I discussed with Dr. Irish Elders of neurology, he feels this is not likely to be a CVA but he does advise a MRI of the head, patient had a full negative stroke workup less than a year ago.  Patient has an NIH stroke scale 0 to does not qualify for TPA given time of onset and symptomology.    ____________________________________________   FINAL CLINICAL IMPRESSION(S) / ED DIAGNOSES  Final diagnoses:  None      This chart was dictated using voice recognition software.  Despite best efforts to proofread,  errors can occur which can change meaning.      Schuyler Amor, MD 09/12/17 [phone]

## 2017-09-12 NOTE — ED Triage Notes (Signed)
Per pt he was seen here Wednesday for hyperglycemia, but states that he started have slurred speech, confusion, dizziness and "brain fog" on Tuesday.  Per pt he was started on metformin and his blood sugar has decreased, but he is still having these symptoms intermittently.  Per pt he was told by PCP to come back and be evaluated for a stroke.  Pt states he also has a history of bell's palsy.  Pt is A&Ox4, in NAD.

## 2017-09-12 NOTE — ED Notes (Signed)
Pt updated on delay for admission bed. Pt readjusted in bed for comfort.

## 2017-09-12 NOTE — ED Notes (Signed)
Report to butch, rn.

## 2017-09-12 NOTE — ED Notes (Signed)
Per given sandwich tray at this time per okay by Dr Burlene Arnt.

## 2017-09-13 ENCOUNTER — Inpatient Hospital Stay (HOSPITAL_COMMUNITY)
Admit: 2017-09-13 | Discharge: 2017-09-13 | Disposition: A | Discharge status: Home / Self Care | Payer: Managed Care, Other (non HMO) | Attending: Internal Medicine | Admitting: Internal Medicine

## 2017-09-13 ENCOUNTER — Inpatient Hospital Stay: Payer: Managed Care, Other (non HMO)

## 2017-09-13 ENCOUNTER — Other Ambulatory Visit: Payer: Self-pay

## 2017-09-13 DIAGNOSIS — I639 Cerebral infarction, unspecified: Secondary | ICD-10-CM

## 2017-09-13 DIAGNOSIS — I5030 Unspecified diastolic (congestive) heart failure: Secondary | ICD-10-CM

## 2017-09-13 LAB — BASIC METABOLIC PANEL
Anion gap: 7 (ref 5–15)
BUN: 20 mg/dL (ref 6–20)
CO2: 26 mmol/L (ref 22–32)
Calcium: 8.5 mg/dL — ABNORMAL LOW (ref 8.9–10.3)
Chloride: 103 mmol/L (ref 101–111)
Creatinine, Ser: 1.12 mg/dL (ref 0.61–1.24)
GFR calc Af Amer: >60 mL/min (ref >60)
GFR calc non Af Amer: >60 mL/min (ref >60)
Glucose, Bld: 253 mg/dL — ABNORMAL HIGH (ref 65–99)
Potassium: 4.4 mmol/L (ref 3.5–5.1)
Sodium: 136 mmol/L (ref 135–145)

## 2017-09-13 LAB — CBC
HCT: 42.9 % (ref 40.0–52.0)
Hemoglobin: 14.7 g/dL (ref 13.0–18.0)
MCH: 29.6 pg (ref 26.0–34.0)
MCHC: 34.4 g/dL (ref 32.0–36.0)
MCV: 86.2 fL (ref 80.0–100.0)
Platelets: 172 10*3/uL (ref 150–440)
RBC: 4.97 MIL/uL (ref 4.40–5.90)
RDW: 12.4 % (ref 11.5–14.5)
WBC: 5 10*3/uL (ref 3.8–10.6)

## 2017-09-13 LAB — LIPID PANEL
Cholesterol: 181 mg/dL (ref 0–200)
HDL: 30 mg/dL — ABNORMAL LOW (ref >40)
LDL Cholesterol: 105 mg/dL — ABNORMAL HIGH (ref 0–99)
Total CHOL/HDL Ratio: 6 RATIO
Triglycerides: 231 mg/dL — ABNORMAL HIGH (ref <150)
VLDL: 46 mg/dL — ABNORMAL HIGH (ref 0–40)

## 2017-09-13 LAB — HEMOGLOBIN A1C
Hgb A1c MFr Bld: 10.8 % — ABNORMAL HIGH (ref 4.8–5.6)
Mean Plasma Glucose: 263.26 mg/dL

## 2017-09-13 LAB — ECHOCARDIOGRAM COMPLETE
Height: 72 in
Weight: 4400 oz

## 2017-09-13 LAB — GLUCOSE, CAPILLARY
Glucose-Capillary: 242 mg/dL — ABNORMAL HIGH (ref 65–99)
Glucose-Capillary: 196 mg/dL — ABNORMAL HIGH (ref 65–99)
Glucose-Capillary: 144 mg/dL — ABNORMAL HIGH (ref 65–99)

## 2017-09-13 MED ORDER — INSULIN ASPART 100 UNIT/ML ~~LOC~~ SOLN
3.0000 [IU] | Freq: Three times a day (TID) | SUBCUTANEOUS | Status: DC
  Administered 2017-09-13 – 2017-09-14 (×2): 3 [IU] via SUBCUTANEOUS
  Filled 2017-09-13 (×2): qty 1

## 2017-09-13 MED ORDER — INSULIN ASPART 100 UNIT/ML ~~LOC~~ SOLN: 0.0000 [IU] | Freq: Every day | SUBCUTANEOUS | Status: DC

## 2017-09-13 MED ORDER — INSULIN GLARGINE 100 UNIT/ML ~~LOC~~ SOLN
12.0000 [IU] | Freq: Every day | SUBCUTANEOUS | Status: DC
  Administered 2017-09-13: 12 [IU] via SUBCUTANEOUS
  Filled 2017-09-13 (×2): qty 0.12

## 2017-09-13 MED ORDER — PREGABALIN 50 MG PO CAPS
100.0000 mg | ORAL_CAPSULE | Freq: Three times a day (TID) | ORAL | Status: DC
  Administered 2017-09-14: 09:00:00 100 mg via ORAL
  Filled 2017-09-13: qty 2

## 2017-09-13 MED ORDER — LIVING WELL WITH DIABETES BOOK
Freq: Once | Status: AC
  Administered 2017-09-13: 17:00:00
  Filled 2017-09-13: qty 1

## 2017-09-13 MED ORDER — INSULIN ASPART 100 UNIT/ML ~~LOC~~ SOLN
0.0000 [IU] | Freq: Three times a day (TID) | SUBCUTANEOUS | Status: DC
  Administered 2017-09-13 – 2017-09-14 (×2): 2 [IU] via SUBCUTANEOUS
  Filled 2017-09-13 (×2): qty 1

## 2017-09-13 MED ORDER — ATORVASTATIN CALCIUM 20 MG PO TABS
40.0000 mg | ORAL_TABLET | Freq: Every day | ORAL | Status: DC
  Administered 2017-09-13: 17:00:00 40 mg via ORAL
  Filled 2017-09-13: qty 2

## 2017-09-13 MED ORDER — BUDESONIDE 0.5 MG/2ML IN SUSP
0.5000 mg | Freq: Two times a day (BID) | RESPIRATORY_TRACT | Status: DC
  Administered 2017-09-13 – 2017-09-14 (×3): 0.5 mg via RESPIRATORY_TRACT
  Filled 2017-09-13 (×3): qty 2

## 2017-09-13 MED ORDER — ASPIRIN 325 MG PO TABS
325.0000 mg | ORAL_TABLET | Freq: Every day | ORAL | Status: DC
  Administered 2017-09-13 – 2017-09-14 (×2): 325 mg via ORAL
  Filled 2017-09-13 (×2): qty 1

## 2017-09-13 NOTE — H&P (Signed)
[place] [facility] [place] NAME: Gregory Crane    MR#:  [id number]  DATE OF BIRTH:  December 23, 1970  DATE OF ADMISSION:  09/12/2017  PRIMARY CARE PHYSICIAN: [name], MD   REQUESTING/REFERRING PHYSICIAN:   CHIEF COMPLAINT:   Chief Complaint  Patient presents with  . Dizziness  . Altered Mental Status    HISTORY OF PRESENT ILLNESS: Gregory Crane  is a 47 y.o. male with a known history of prior stroke, without any deficits and newly diagnosed diabetes type 2, just recently started on metformin.  He also has history of hypertension and hypothyroidism.  He denies smoking and alcohol use. Patient presented to emergency room for "feeling weird".  He complains of fogginess on his brain and weird sensation in his legs while walking for the past 1-2 days. He denies any focal weakness, headache, or vision changes.  No chest pain/shortness of breath, no fever or chills no nausea vomiting abdominal pain or diarrhea.  He is not compliant with aspirin use and he is not sure for how many years the blood sugars have been elevated until he was recently diagnosed with diabetes just a few days ago. MRI of the brain done in the emergency room showed acute stroke in the right basal ganglia and corona radiata territory. Patient is admitted for further evaluation and treatment.   PAST MEDICAL HISTORY:   Past Medical History:  Diagnosis Date  . Allergy   . Bell's palsy   . Diabetes mellitus without complication (HCC)    diet controlled  . Hypertension   . Hypothyroidism   . Kidney stones   . Stroke [facility])     PAST SURGICAL HISTORY:  Past Surgical History:  Procedure Laterality Date  . LUMBAR PUNCTURE     as child  . NO PAST SURGERIES      SOCIAL HISTORY:  Social History   Tobacco Use  . Smoking status: Never Smoker  . Smokeless tobacco: Never Used  Substance Use Topics  . Alcohol use: No    Alcohol/week: 0.0 oz    Comment: very rarely (1-2/year)     FAMILY HISTORY:  Family History  Problem Relation Age of Onset  . Breast cancer Mother   . Diabetes Father   . Diabetes Sister   . Arthritis Maternal Grandmother   . Diabetes Maternal Grandmother     DRUG ALLERGIES: No Known Allergies  REVIEW OF SYSTEMS:   CONSTITUTIONAL: No fever, fatigue or weakness.  EYES: No blurred or double vision.  EARS, NOSE, AND THROAT: No tinnitus or ear pain.  RESPIRATORY: No cough, shortness of breath, wheezing or hemoptysis.  CARDIOVASCULAR: No chest pain, orthopnea, edema.  GASTROINTESTINAL: No nausea, vomiting, diarrhea or abdominal pain.  GENITOURINARY: No dysuria, hematuria.  ENDOCRINE: No polyuria, nocturia,  HEMATOLOGY: No anemia, easy bruising or bleeding SKIN: No rash or lesion. MUSCULOSKELETAL: No joint pain.   NEUROLOGIC: Patient complains of foggy brain and weird feeling while walking.  No focal weakness. PSYCHIATRY: No anxiety or depression.   MEDICATIONS AT HOME:  Prior to Admission medications   Medication Sig Start Date End Date Taking? Authorizing Provider  aspirin EC 81 MG EC tablet Take 1 tablet (81 mg total) by mouth daily. 10/22/16  Yes Henreitta Leber, MD  levothyroxine (SYNTHROID) 112 MCG tablet Take 1 tablet (112 mcg total) by mouth daily before breakfast. 04/18/17  Yes [name], MD  losartan (COZAAR) 25 MG tablet Take 1 tablet (25 mg total) by mouth  daily. 06/18/17  Yes [name], MD  metFORMIN (GLUCOPHAGE) 500 MG tablet Take 1 tablet (500 mg total) by mouth daily with breakfast. 09/10/17 09/10/18 Yes Schaevitz, Randall An, MD  pregabalin (LYRICA) 100 MG capsule Take 1 capsule (100 mg total) by mouth 2 (two) times daily. Patient taking differently: Take 100 mg by mouth 3 (three) times daily.  09/23/16  Yes [name], MD  fluticasone (FLOVENT HFA) 110 MCG/ACT inhaler Inhale 2 puffs into the lungs 2 (two) times daily. Rinse mouth after use. Patient not taking: Reported on 09/12/2017 01/09/17   [name], MD  glucose blood (FREESTYLE LITE) test strip Check blood sugars twice a day (Dx. 250.02) 04/18/17   [name], MD  Testosterone 20.25 MG/ACT (1.62%) GEL 2 pump to each shoulder daily. 4 pumps total. 10/28/16 03/26/17  [provider]      PHYSICAL EXAMINATION:   VITAL SIGNS: Blood pressure (!) 164/78, pulse 80, temperature 97.9 F (36.6 C), temperature source Oral, resp. rate 20, height (P) 6' (1.829 m), weight (P) 124.7 kg (275 lb), SpO2 97 %.  GENERAL:  47 y.o.-year-old patient lying in the bed with no acute distress.  EYES: Pupils equal, round, reactive to light and accommodation. No scleral icterus. Extraocular muscles intact.  HEENT: Head atraumatic, normocephalic. Oropharynx and nasopharynx clear.  NECK:  Supple, no jugular venous distention. No thyroid enlargement, no tenderness.  LUNGS: Normal breath sounds bilaterally, no wheezing, rales,rhonchi or crepitation. No use of accessory muscles of respiration.  CARDIOVASCULAR: S1, S2 normal. No murmurs, rubs, or gallops.  ABDOMEN: Soft, nontender, nondistended. Bowel sounds present. No organomegaly or mass.  EXTREMITIES: No pedal edema, cyanosis, or clubbing.  NEUROLOGIC: Cranial nerves II through XII are intact. Muscle strength 5/5 in all extremities. Sensation intact.  Normal gait.  PSYCHIATRIC: The patient is alert and oriented x 3.  SKIN: No obvious rash, lesion, or ulcer.   LABORATORY PANEL:   CBC Recent Labs  Lab 09/10/17 1141 09/12/17 1444  WBC 6.2 6.8  HGB 16.0 16.0  HCT 46.2 46.9  PLT 187 220  MCV 86.0 87.5  MCH 29.8 29.9  MCHC 34.7 34.2  RDW 12.5 12.7  LYMPHSABS  --  1.6  MONOABS  --  0.4  EOSABS  --  0.1  BASOSABS  --  0.0   ------------------------------------------------------------------------------------------------------------------  Chemistries  Recent Labs  Lab 09/10/17 1141 09/12/17 1444  NA 135 136  K 4.0 4.7  CL 101 101  CO2 23 24  GLUCOSE 330* 265*  BUN 16 21*   CREATININE 1.17 1.15  CALCIUM 8.8* 9.3  AST  --  20  ALT  --  15*  ALKPHOS  --  80  BILITOT  --  1.0   ------------------------------------------------------------------------------------------------------------------ estimated creatinine clearance is 107.7 mL/min (by C-G formula based on SCr of 1.15 mg/dL). ------------------------------------------------------------------------------------------------------------------ No results for input(s): TSH, T4TOTAL, T3FREE, THYROIDAB in the last 72 hours.  Invalid input(s): FREET3   Coagulation profile Recent Labs  Lab 09/12/17 1626  INR 0.96   ------------------------------------------------------------------------------------------------------------------- No results for input(s): DDIMER in the last 72 hours. -------------------------------------------------------------------------------------------------------------------  Cardiac Enzymes Recent Labs  Lab 09/12/17 1444  TROPONINI <0.03   ------------------------------------------------------------------------------------------------------------------ Invalid input(s): POCBNP  ---------------------------------------------------------------------------------------------------------------  Urinalysis    Component Value Date/Time   COLORURINE YELLOW (A) 09/12/2017 1722   APPEARANCEUR HAZY (A) 09/12/2017 1722   APPEARANCEUR Clear 01/27/2014 0247   LABSPEC 1.020 09/12/2017 1722   LABSPEC 1.033 01/27/2014 0247   PHURINE 5.0 09/12/2017 1722   GLUCOSEU >=500 (A)  09/12/2017 1722   GLUCOSEU >=500 01/27/2014 0247   HGBUR SMALL (A) 09/12/2017 1722   BILIRUBINUR NEGATIVE 09/12/2017 1722   BILIRUBINUR Negative 01/27/2014 0247   KETONESUR 20 (A) 09/12/2017 1722   PROTEINUR 100 (A) 09/12/2017 1722   NITRITE NEGATIVE 09/12/2017 1722   LEUKOCYTESUR NEGATIVE 09/12/2017 1722   LEUKOCYTESUR Negative 01/27/2014 0247     RADIOLOGY: Dg Chest 2 View  Result Date: 09/12/2017 CLINICAL  DATA:  Nonproductive cough 3-4 weeks EXAM: CHEST  2 VIEW COMPARISON:  01/09/2017 FINDINGS: The heart size and mediastinal contours are within normal limits. Both lungs are clear. The visualized skeletal structures are unremarkable. IMPRESSION: No active cardiopulmonary disease. Electronically Signed   By: Franchot Gallo M.D.   On: 09/12/2017 18:15   Ct Head Wo Contrast  Result Date: 09/12/2017 CLINICAL DATA:  Slurred speech, confusion, dizziness and altered level of consciousness on 09/10/2007. EXAM: CT HEAD WITHOUT CONTRAST TECHNIQUE: Contiguous axial images were obtained from the base of the skull through the vertex without intravenous contrast. COMPARISON:  Brain MRI and head CT 10/20/2016. FINDINGS: Brain: There is a new focus of hypoattenuation in the periventricular deep white matter on the right most consistent with a deep white matter infarct which appears remote. Tiny lacunar infarction in the left basal ganglia is unchanged. No hemorrhage, midline shift, mass lesion, mass effect or abnormal extra-axial fluid collection. No hydrocephalus or pneumocephalus. Vascular: No hyperdense vessel or unexpected calcification. Skull: Intact. Sinuses/Orbits: Negative. Other: None. IMPRESSION: Since the prior exams, the patient has suffered a periventricular deep white matter infarct on the right. The infarct appears remote. No acute abnormality is identified. Electronically Signed   By: Inge Rise M.D.   On: 09/12/2017 15:29   Mr Brain Wo Contrast  Result Date: 09/12/2017 CLINICAL DATA:  Slurred speech, confusion, dizziness and mental fogging for 4 days. EXAM: MRI HEAD WITHOUT CONTRAST TECHNIQUE: Multiplanar, multiecho pulse sequences of the brain and surrounding structures were obtained without intravenous contrast. COMPARISON:  CT HEAD September 12, 2017 at 1519 hours and MRI of the head October 20, 2016 FINDINGS: BRAIN: 22 x 11 mm reduced diffusion RIGHT corona radiata to RIGHT basal ganglia with  corresponding low to early normalization of ADC values. No susceptibility artifact to suggest hemorrhage. Ventricles and sulci are normal for patient's age. Old LEFT globus pallidus lacunar infarct. No midline shift, mass effect or masses. No abnormal extra-axial fluid collections. VASCULAR: Dolichoectatic major intracranial vascular flow voids present at skull base associated with chronic hypertension. SKULL AND UPPER CERVICAL SPINE: No abnormal sellar expansion. No suspicious calvarial bone marrow signal. Craniocervical junction maintained. SINUSES/ORBITS: Included paranasal sinuses are well aerated. Imaged mastoid air cells are well aerated. The included ocular globes and orbital contents are non-suspicious. OTHER: None. IMPRESSION: 1. Acute to early subacute RIGHT basal ganglia/corona radiata nonhemorrhagic infarct. 2. Old LEFT basal ganglia lacunar infarct. 3. Acute findings discussed with and reconfirmed by Dr.JAMES MCSHANE on 09/12/2017 at 9:38 pm. Electronically Signed   By: Elon Alas M.D.   On: 09/12/2017 21:40    EKG: Orders placed or performed during the hospital encounter of 09/12/17  . EKG 12-Lead  . EKG 12-Lead  . ED EKG  . ED EKG    IMPRESSION AND PLAN:  1.  Acute stroke in the right basal ganglia and corona radiata territory, confirmed per brain MRI. Patient is started on aspirin.  We will check lipids level, 2D echo and B/L carotid Doppler.  I would recommend discontinuing the testosterone supplement as this is  a risk factor for cardiovascular events and strokes. 2.  Diabetes type 2 uncontrolled, recently diagnosed.  Patient was started on Metformin and was advised to follow a low-carb diet.  We will continue to monitor blood sugars before meals and at bedtime.  Will use insulin sliding scale to correct the hyperglycemia.  He might need additional medication to keep the blood sugar under control. 4.  Hypertension, restart home medications.  All the records are reviewed and  case discussed with ED provider. Management plans discussed with the patient, family and they are in agreement.  CODE STATUS:    Code Status Orders  (From admission, onward)        Start     Ordered   09/12/17 2348  Full code  Continuous     09/12/17 2347    Code Status History    Date Active Date Inactive Code Status Order ID Comments User Context   10/20/2016 20:44 10/21/2016 15:29 Full Code [id number]  Gladstone Lighter, MD Inpatient       TOTAL TIME TAKING CARE OF THIS PATIENT: 35 minutes.    Amelia Jo M.D on 09/13/2017 at 1:14 AM  Between 7am to 6pm - Pager - [phone]  After 6pm go to [email or web address] - password [email or web address] [facility]  Office  [phone]  CC: Primary care physician; [name], MD

## 2017-09-13 NOTE — Progress Notes (Signed)
Patient ID: Gregory Crane, male   DOB: 1970-09-09, 47 y.o.   MRN: [id number]  [employer] PROGRESS NOTE  Gregory Crane [facility] NGE:[id number] DOB: April 06, 1971 DOA: 09/12/2017 PCP: [name], MD  HPI/Subjective: Patient started having symptoms on Tuesday with difficulty with concentration some intermittent slurred speech.  He noticed that his sugar is high.  Objective: Vitals:   09/13/17 0815 09/13/17 1422  BP: (!) 151/76 (!) 156/87  Pulse: 78 76  Resp:    Temp:  (!) 97.5 F (36.4 C)  SpO2: 97% 99%    Filed Weights   09/12/17 1433 09/12/17 2359 09/13/17 0500  Weight: 123.4 kg (272 lb) 124.7 kg (275 lb) 124.7 kg (275 lb)    ROS: Review of Systems  Constitutional: Negative for chills and fever.  Eyes: Negative for blurred vision.  Respiratory: Negative for cough and shortness of breath.   Cardiovascular: Negative for chest pain.  Gastrointestinal: Negative for abdominal pain, constipation, diarrhea, nausea and vomiting.  Genitourinary: Negative for dysuria.  Musculoskeletal: Negative for joint pain.  Neurological: Negative for dizziness and headaches.   Exam: Physical Exam  HENT:  Nose: No mucosal edema.  Mouth/Throat: No oropharyngeal exudate or posterior oropharyngeal edema.  Eyes: Conjunctivae, EOM and lids are normal. Pupils are equal, round, and reactive to light.  Neck: No JVD present. Carotid bruit is not present. No edema present. No thyroid mass and no thyromegaly present.  Cardiovascular: S1 normal and S2 normal. Exam reveals no gallop.  No murmur heard. Pulses:      Dorsalis pedis pulses are 2+ on the right side, and 2+ on the left side.  Respiratory: No respiratory distress. He has no wheezes. He has no rhonchi. He has no rales.  GI: Soft. Bowel sounds are normal. There is no tenderness.  Musculoskeletal:       Right shoulder: He exhibits no swelling.  Lymphadenopathy:    He has no cervical adenopathy.  Neurological: He is alert. No cranial nerve  deficit.  Skin: Skin is warm. No rash noted. Nails show no clubbing.  Psychiatric: He has a normal mood and affect.      Data Reviewed: Basic Metabolic Panel: Recent Labs  Lab 09/10/17 1141 09/12/17 1444 09/13/17 0555  NA 135 136 136  K 4.0 4.7 4.4  CL 101 101 103  CO2 23 24 26   GLUCOSE 330* 265* 253*  BUN 16 21* 20  CREATININE 1.17 1.15 1.12  CALCIUM 8.8* 9.3 8.5*   Liver Function Tests: Recent Labs  Lab 09/12/17 1444  AST 20  ALT 15*  ALKPHOS 80  BILITOT 1.0  PROT 7.5  ALBUMIN 3.6   CBC: Recent Labs  Lab 09/10/17 1141 09/12/17 1444 09/13/17 0555  WBC 6.2 6.8 5.0  NEUTROABS  --  4.7  --   HGB 16.0 16.0 14.7  HCT 46.2 46.9 42.9  MCV 86.0 87.5 86.2  PLT 187 220 172   Cardiac Enzymes: Recent Labs  Lab 09/12/17 1444  TROPONINI <0.03    CBG: Recent Labs  Lab 09/12/17 1723 09/13/17 0751  GLUCAP 229* 242*     Studies: Dg Chest 2 View  Result Date: 09/12/2017 CLINICAL DATA:  Nonproductive cough 3-4 weeks EXAM: CHEST  2 VIEW COMPARISON:  01/09/2017 FINDINGS: The heart size and mediastinal contours are within normal limits. Both lungs are clear. The visualized skeletal structures are unremarkable. IMPRESSION: No active cardiopulmonary disease. Electronically Signed   By: Franchot Gallo M.D.   On: 09/12/2017 18:15   Ct Head Wo Contrast  Result Date: 09/12/2017 CLINICAL DATA:  Slurred speech, confusion, dizziness and altered level of consciousness on 09/10/2007. EXAM: CT HEAD WITHOUT CONTRAST TECHNIQUE: Contiguous axial images were obtained from the base of the skull through the vertex without intravenous contrast. COMPARISON:  Brain MRI and head CT 10/20/2016. FINDINGS: Brain: There is a new focus of hypoattenuation in the periventricular deep white matter on the right most consistent with a deep white matter infarct which appears remote. Tiny lacunar infarction in the left basal ganglia is unchanged. No hemorrhage, midline shift, mass lesion, mass effect  or abnormal extra-axial fluid collection. No hydrocephalus or pneumocephalus. Vascular: No hyperdense vessel or unexpected calcification. Skull: Intact. Sinuses/Orbits: Negative. Other: None. IMPRESSION: Since the prior exams, the patient has suffered a periventricular deep white matter infarct on the right. The infarct appears remote. No acute abnormality is identified. Electronically Signed   By: Inge Rise M.D.   On: 09/12/2017 15:29   Mr Brain Wo Contrast  Result Date: 09/12/2017 CLINICAL DATA:  Slurred speech, confusion, dizziness and mental fogging for 4 days. EXAM: MRI HEAD WITHOUT CONTRAST TECHNIQUE: Multiplanar, multiecho pulse sequences of the brain and surrounding structures were obtained without intravenous contrast. COMPARISON:  CT HEAD September 12, 2017 at 1519 hours and MRI of the head October 20, 2016 FINDINGS: BRAIN: 22 x 11 mm reduced diffusion RIGHT corona radiata to RIGHT basal ganglia with corresponding low to early normalization of ADC values. No susceptibility artifact to suggest hemorrhage. Ventricles and sulci are normal for patient's age. Old LEFT globus pallidus lacunar infarct. No midline shift, mass effect or masses. No abnormal extra-axial fluid collections. VASCULAR: Dolichoectatic major intracranial vascular flow voids present at skull base associated with chronic hypertension. SKULL AND UPPER CERVICAL SPINE: No abnormal sellar expansion. No suspicious calvarial bone marrow signal. Craniocervical junction maintained. SINUSES/ORBITS: Included paranasal sinuses are well aerated. Imaged mastoid air cells are well aerated. The included ocular globes and orbital contents are non-suspicious. OTHER: None. IMPRESSION: 1. Acute to early subacute RIGHT basal ganglia/corona radiata nonhemorrhagic infarct. 2. Old LEFT basal ganglia lacunar infarct. 3. Acute findings discussed with and reconfirmed by Dr.JAMES MCSHANE on 09/12/2017 at 9:38 pm. Electronically Signed   By: Elon Alas  M.D.   On: 09/12/2017 21:40   US Carotid Bilateral  Result Date: 09/13/2017 CLINICAL DATA:  Small acute right basal ganglia infarct EXAM: BILATERAL CAROTID DUPLEX ULTRASOUND TECHNIQUE: Pearline Cables scale imaging, color Doppler and duplex ultrasound were performed of bilateral carotid and vertebral arteries in the neck. COMPARISON:  09/12/2017 FINDINGS: Criteria: Quantification of carotid stenosis is based on velocity parameters that correlate the residual internal carotid diameter with NASCET-based stenosis levels, using the diameter of the distal internal carotid lumen as the denominator for stenosis measurement. The following velocity measurements were obtained: RIGHT ICA:  82/28 cm/sec CCA:  82/42 cm/sec SYSTOLIC ICA/CCA RATIO:  0.9 DIASTOLIC ICA/CCA RATIO:  1.6 ECA:  126 cm/sec LEFT ICA:  40/13 cm/sec CCA:  35/3 cm/sec SYSTOLIC ICA/CCA RATIO:  0.5 DIASTOLIC ICA/CCA RATIO:  2.2 ECA:  101 cm/sec RIGHT CAROTID ARTERY: Minor intimal thickening without significant atherosclerotic plaque formation. No hemodynamically significant right ICA stenosis, velocity elevation, or turbulent flow. Degree of narrowing less than 50%. RIGHT VERTEBRAL ARTERY:  Antegrade LEFT CAROTID ARTERY: Minor atherosclerotic plaque formation. No hemodynamically significant left ICA stenosis, velocity elevation, or turbulent flow. LEFT VERTEBRAL ARTERY:  Antegrade IMPRESSION: Minor intimal thickening and carotid atherosclerosis. No hemodynamically significant ICA stenosis. Degree of narrowing less than 50% bilaterally. Patent antegrade vertebral flow bilaterally Electronically  Signed   By: Jerilynn Mages.  Shick M.D.   On: 09/13/2017 12:03    Scheduled Meds: . aspirin  325 mg Oral Daily  . budesonide (PULMICORT) nebulizer solution  0.5 mg Nebulization BID  . docusate sodium  100 mg Oral BID  . heparin  5,000 Units Subcutaneous Q8H  . insulin aspart  0-5 Units Subcutaneous QHS  . insulin aspart  0-9 Units Subcutaneous TID WC  . insulin aspart  3 Units  Subcutaneous TID WC  . insulin glargine  12 Units Subcutaneous QHS  . levothyroxine  112 mcg Oral QAC breakfast  . living well with diabetes book   Does not apply Once  . losartan  25 mg Oral Daily  . metFORMIN  500 mg Oral Q breakfast  . pregabalin  100 mg Oral BID   Continuous Infusions:  Assessment/Plan:  1. Acute to early subacute right basal ganglia/corona radiata nonhemorrhagic infarct.  Aspirin and Lipitor prescribed. 2. Uncontrolled type 2 diabetes mellitus.  Start Lantus insulin and short acting insulin. Hemoglobin A1c 10.8.  Diet exercise and weight loss discussed at length.  On metformin also. 3. Hypothyroidism unspecified on levothyroxine 4. Essential hypertension on losartan 5. Diabetic neuropathy on Lyrica 6. Obesity weight loss recommended  Code Status:     Code Status Orders  (From admission, onward)        Start     Ordered   09/12/17 2348  Full code  Continuous     09/12/17 2347    Code Status History    Date Active Date Inactive Code Status Order ID Comments User Context   10/20/2016 20:44 10/21/2016 15:29 Full Code [id number]  Gladstone Lighter, MD Inpatient     Family Communication: Wife at bedside Disposition Plan: Home tomorrow after diabetic teaching  Consultants:  Neurology  Time spent: 28 minutes  [place]

## 2017-09-13 NOTE — Plan of Care (Signed)
  Progressing Education: Knowledge of General Education information will improve 09/13/2017 1310 - Progressing by Cherylann Parr, RN Health Behavior/Discharge Planning: Ability to manage health-related needs will improve 09/13/2017 1310 - Progressing by Cherylann Parr, RN Clinical Measurements: Ability to maintain clinical measurements within normal limits will improve 09/13/2017 1310 - Progressing by Cherylann Parr, RN Will remain free from infection 09/13/2017 1310 - Progressing by Cherylann Parr, RN Diagnostic test results will improve 09/13/2017 1310 - Progressing by Cherylann Parr, RN Respiratory complications will improve 09/13/2017 1310 - Progressing by Cherylann Parr, RN Cardiovascular complication will be avoided 09/13/2017 1310 - Progressing by Cherylann Parr, RN Activity: Risk for activity intolerance will decrease 09/13/2017 1310 - Progressing by Cherylann Parr, RN Nutrition: Adequate nutrition will be maintained 09/13/2017 1310 - Progressing by Cherylann Parr, RN Coping: Level of anxiety will decrease 09/13/2017 1310 - Progressing by Cherylann Parr, RN Elimination: Will not experience complications related to bowel motility 09/13/2017 1310 - Progressing by Cherylann Parr, RN Will not experience complications related to urinary retention 09/13/2017 1310 - Progressing by Cherylann Parr, RN Pain Managment: General experience of comfort will improve 09/13/2017 1310 - Progressing by Cherylann Parr, RN Safety: Ability to remain free from injury will improve 09/13/2017 1310 - Progressing by Cherylann Parr, RN Skin Integrity: Risk for impaired skin integrity will decrease 09/13/2017 1310 - Progressing by Cherylann Parr, RN Education: Knowledge of disease or condition will improve 09/13/2017 1310 - Progressing by Cherylann Parr, RN Knowledge of secondary prevention will improve 09/13/2017 1310 - Progressing by Cherylann Parr, RN Knowledge of patient specific risk  factors addressed and post discharge goals established will improve 09/13/2017 1310 - Progressing by Cherylann Parr, RN Health Behavior/Discharge Planning: Ability to manage health-related needs will improve 09/13/2017 1310 - Progressing by Cherylann Parr, RN Self-Care: Ability to participate in self-care as condition permits will improve 09/13/2017 1310 - Progressing by Cherylann Parr, RN Ischemic Stroke/TIA Tissue Perfusion: Complications of ischemic stroke/TIA will be minimized 09/13/2017 1310 - Progressing by Cherylann Parr, RN

## 2017-09-13 NOTE — Progress Notes (Signed)
Pt educated on insulin administration, pt able to demonstrate insulin administration, living well with diabetes book given

## 2017-09-13 NOTE — Progress Notes (Signed)
Pt reports taking Lyrica 100 mg three times a day.  Spoke with Dr Anselm Jungling and received order to change to three times a day. Dorna Bloom RN

## 2017-09-13 NOTE — Consult Note (Signed)
Reason for Consult:stroke Referring Physician: Dr. Leslye Peer   CC: confusion   HPI: Gregory Crane is an 47 y.o. male with a known history of prior stroke, without any deficits and newly diagnosed diabetes type 2, just recently started on metformin.  He also has history of hypertension and hypothyroidism.  He denies smoking and alcohol use. Patient presented to emergency room for "feeling weird".  He complains of fogginess on his brain and weird sensation in his legs while walking for the past 1-2 days. Today improved.  Pt has R BG subcortical infarct.     Past Medical History:  Diagnosis Date  . Allergy   . Bell's palsy   . Diabetes mellitus without complication (HCC)    diet controlled  . Hypertension   . Hypothyroidism   . Kidney stones   . Stroke [facility])     Past Surgical History:  Procedure Laterality Date  . LUMBAR PUNCTURE     as child  . NO PAST SURGERIES      Family History  Problem Relation Age of Onset  . Breast cancer Mother   . Diabetes Father   . Diabetes Sister   . Arthritis Maternal Grandmother   . Diabetes Maternal Grandmother     Social History:  reports that  has never smoked. he has never used smokeless tobacco. He reports that he does not drink alcohol or use drugs.  No Known Allergies  Medications: I have reviewed the patient's current medications.  ROS: History obtained from the patient  General ROS: negative for - chills, fatigue, fever, night sweats, weight gain or weight loss Psychological ROS: negative for - behavioral disorder, hallucinations, memory difficulties, mood swings or suicidal ideation Ophthalmic ROS: negative for - blurry vision, double vision, eye pain or loss of vision ENT ROS: negative for - epistaxis, nasal discharge, oral lesions, sore throat, tinnitus or vertigo Allergy and Immunology ROS: negative for - hives or itchy/watery eyes Hematological and Lymphatic ROS: negative for - bleeding problems, bruising or swollen lymph  nodes Endocrine ROS: negative for - galactorrhea, hair pattern changes, polydipsia/polyuria or temperature intolerance Respiratory ROS: negative for - cough, hemoptysis, shortness of breath or wheezing Cardiovascular ROS: negative for - chest pain, dyspnea on exertion, edema or irregular heartbeat Gastrointestinal ROS: negative for - abdominal pain, diarrhea, hematemesis, nausea/vomiting or stool incontinence Genito-Urinary ROS: negative for - dysuria, hematuria, incontinence or urinary frequency/urgency Musculoskeletal ROS: negative for - joint swelling or muscular weakness Neurological ROS: as noted in HPI Dermatological ROS: negative for rash and skin lesion changes  Physical Examination: Blood pressure (!) 151/76, pulse 78, temperature 98 F (36.7 C), temperature source Oral, resp. rate 18, height 6' (1.829 m), weight 275 lb (124.7 kg), SpO2 97 %.   Neurological Examination   Mental Status: Alert, oriented, thought content appropriate.  Speech fluent without evidence of aphasia.  Able to follow 3 step commands without difficulty. Cranial Nerves: II: Discs flat bilaterally; Visual fields grossly normal, pupils equal, round, reactive to light and accommodation III,IV, VI: ptosis not present, extra-ocular motions intact bilaterally V,VII: smile symmetric, facial light touch sensation normal bilaterally VIII: hearing normal bilaterally IX,X: gag reflex present XI: bilateral shoulder shrug XII: midline tongue extension Motor: Right : Upper extremity   5/5    Left:     Upper extremity   5/5  Lower extremity   5/5     Lower extremity   5/5 Tone and bulk:normal tone throughout; no atrophy noted Sensory: Pinprick and light touch intact throughout,  bilaterally Deep Tendon Reflexes: 2+ and symmetric throughout Plantars: Right: downgoing   Left: downgoing Cerebellar: normal finger-to-nose, normal rapid alternating movements and normal heel-to-shin test Gait: not tested       Laboratory Studies:   Basic Metabolic Panel: Recent Labs  Lab 09/10/17 1141 09/12/17 1444 09/13/17 0555  NA 135 136 136  K 4.0 4.7 4.4  CL 101 101 103  CO2 23 24 26   GLUCOSE 330* 265* 253*  BUN 16 21* 20  CREATININE 1.17 1.15 1.12  CALCIUM 8.8* 9.3 8.5*    Liver Function Tests: Recent Labs  Lab 09/12/17 1444  AST 20  ALT 15*  ALKPHOS 80  BILITOT 1.0  PROT 7.5  ALBUMIN 3.6   No results for input(s): LIPASE, AMYLASE in the last 168 hours. No results for input(s): AMMONIA in the last 168 hours.  CBC: Recent Labs  Lab 09/10/17 1141 09/12/17 1444 09/13/17 0555  WBC 6.2 6.8 5.0  NEUTROABS  --  4.7  --   HGB 16.0 16.0 14.7  HCT 46.2 46.9 42.9  MCV 86.0 87.5 86.2  PLT 187 220 172    Cardiac Enzymes: Recent Labs  Lab 09/12/17 1444  TROPONINI <0.03    BNP: Invalid input(s): POCBNP  CBG: Recent Labs  Lab 09/12/17 1723 09/13/17 0751  GLUCAP 229* 242*    Microbiology: No results found for this or any previous visit.  Coagulation Studies: Recent Labs    09/12/17 1626  LABPROT 12.7  INR 0.96    Urinalysis:  Recent Labs  Lab 09/10/17 1141 09/12/17 1722  COLORURINE YELLOW* YELLOW*  LABSPEC 1.025 1.020  PHURINE 5.0 5.0  GLUCOSEU >=500* >=500*  HGBUR SMALL* SMALL*  BILIRUBINUR NEGATIVE NEGATIVE  KETONESUR 5* 20*  PROTEINUR 100* 100*  NITRITE NEGATIVE NEGATIVE  LEUKOCYTESUR NEGATIVE NEGATIVE    Lipid Panel:     Component Value Date/Time   CHOL 181 09/13/2017 0555   CHOL 164 03/25/2017 1036   TRIG 231 (H) 09/13/2017 0555   HDL 30 (L) 09/13/2017 0555   HDL 29 (L) 03/25/2017 1036   CHOLHDL 6.0 09/13/2017 0555   VLDL 46 (H) 09/13/2017 0555   LDLCALC 105 (H) 09/13/2017 0555   LDLCALC 66 03/25/2017 1036    HgbA1C:  Lab Results  Component Value Date   HGBA1C 10.8 (H) 09/13/2017    Urine Drug Screen:  No results found for: LABOPIA, COCAINSCRNUR, LABBENZ, AMPHETMU, THCU, LABBARB  Alcohol Level: No results for input(s): ETH in  the last 168 hours.  Other results: EKG: normal EKG, normal sinus rhythm, unchanged from previous tracings.  Imaging: Dg Chest 2 View  Result Date: 09/12/2017 CLINICAL DATA:  Nonproductive cough 3-4 weeks EXAM: CHEST  2 VIEW COMPARISON:  01/09/2017 FINDINGS: The heart size and mediastinal contours are within normal limits. Both lungs are clear. The visualized skeletal structures are unremarkable. IMPRESSION: No active cardiopulmonary disease. Electronically Signed   By: Franchot Gallo M.D.   On: 09/12/2017 18:15   Ct Head Wo Contrast  Result Date: 09/12/2017 CLINICAL DATA:  Slurred speech, confusion, dizziness and altered level of consciousness on 09/10/2007. EXAM: CT HEAD WITHOUT CONTRAST TECHNIQUE: Contiguous axial images were obtained from the base of the skull through the vertex without intravenous contrast. COMPARISON:  Brain MRI and head CT 10/20/2016. FINDINGS: Brain: There is a new focus of hypoattenuation in the periventricular deep white matter on the right most consistent with a deep white matter infarct which appears remote. Tiny lacunar infarction in the left basal ganglia is unchanged. No  hemorrhage, midline shift, mass lesion, mass effect or abnormal extra-axial fluid collection. No hydrocephalus or pneumocephalus. Vascular: No hyperdense vessel or unexpected calcification. Skull: Intact. Sinuses/Orbits: Negative. Other: None. IMPRESSION: Since the prior exams, the patient has suffered a periventricular deep white matter infarct on the right. The infarct appears remote. No acute abnormality is identified. Electronically Signed   By: Inge Rise M.D.   On: 09/12/2017 15:29   Mr Brain Wo Contrast  Result Date: 09/12/2017 CLINICAL DATA:  Slurred speech, confusion, dizziness and mental fogging for 4 days. EXAM: MRI HEAD WITHOUT CONTRAST TECHNIQUE: Multiplanar, multiecho pulse sequences of the brain and surrounding structures were obtained without intravenous contrast. COMPARISON:  CT  HEAD September 12, 2017 at 1519 hours and MRI of the head October 20, 2016 FINDINGS: BRAIN: 22 x 11 mm reduced diffusion RIGHT corona radiata to RIGHT basal ganglia with corresponding low to early normalization of ADC values. No susceptibility artifact to suggest hemorrhage. Ventricles and sulci are normal for patient's age. Old LEFT globus pallidus lacunar infarct. No midline shift, mass effect or masses. No abnormal extra-axial fluid collections. VASCULAR: Dolichoectatic major intracranial vascular flow voids present at skull base associated with chronic hypertension. SKULL AND UPPER CERVICAL SPINE: No abnormal sellar expansion. No suspicious calvarial bone marrow signal. Craniocervical junction maintained. SINUSES/ORBITS: Included paranasal sinuses are well aerated. Imaged mastoid air cells are well aerated. The included ocular globes and orbital contents are non-suspicious. OTHER: None. IMPRESSION: 1. Acute to early subacute RIGHT basal ganglia/corona radiata nonhemorrhagic infarct. 2. Old LEFT basal ganglia lacunar infarct. 3. Acute findings discussed with and reconfirmed by Dr.JAMES MCSHANE on 09/12/2017 at 9:38 pm. Electronically Signed   By: Elon Alas M.D.   On: 09/12/2017 21:40   US Carotid Bilateral  Result Date: 09/13/2017 CLINICAL DATA:  Small acute right basal ganglia infarct EXAM: BILATERAL CAROTID DUPLEX ULTRASOUND TECHNIQUE: Pearline Cables scale imaging, color Doppler and duplex ultrasound were performed of bilateral carotid and vertebral arteries in the neck. COMPARISON:  09/12/2017 FINDINGS: Criteria: Quantification of carotid stenosis is based on velocity parameters that correlate the residual internal carotid diameter with NASCET-based stenosis levels, using the diameter of the distal internal carotid lumen as the denominator for stenosis measurement. The following velocity measurements were obtained: RIGHT ICA:  82/28 cm/sec CCA:  06/23 cm/sec SYSTOLIC ICA/CCA RATIO:  0.9 DIASTOLIC ICA/CCA  RATIO:  1.6 ECA:  126 cm/sec LEFT ICA:  40/13 cm/sec CCA:  76/2 cm/sec SYSTOLIC ICA/CCA RATIO:  0.5 DIASTOLIC ICA/CCA RATIO:  2.2 ECA:  101 cm/sec RIGHT CAROTID ARTERY: Minor intimal thickening without significant atherosclerotic plaque formation. No hemodynamically significant right ICA stenosis, velocity elevation, or turbulent flow. Degree of narrowing less than 50%. RIGHT VERTEBRAL ARTERY:  Antegrade LEFT CAROTID ARTERY: Minor atherosclerotic plaque formation. No hemodynamically significant left ICA stenosis, velocity elevation, or turbulent flow. LEFT VERTEBRAL ARTERY:  Antegrade IMPRESSION: Minor intimal thickening and carotid atherosclerosis. No hemodynamically significant ICA stenosis. Degree of narrowing less than 50% bilaterally. Patent antegrade vertebral flow bilaterally Electronically Signed   By: Jerilynn Mages.  Shick M.D.   On: 09/13/2017 12:03     Assessment/Plan:  47 y.o. male with a known history of prior stroke, without any deficits and newly diagnosed diabetes type 2, just recently started on metformin.  He also has history of hypertension and hypothyroidism.  He denies smoking and alcohol use. Patient presented to emergency room for "feeling weird".  He complains of fogginess on his brain and weird sensation in his legs while walking for the past 1-2 days.  Today improved.  Pt has R BG subcortical infarct.    - Stroke in setting of small vessel dz - was not compliant with ASA at home which is restarted - getting [place] - pt/ot and d/c planning as back to baseline - discussed the need for compliance with home medications.  Leotis Pain   09/13/2017, 12:35 PM

## 2017-09-13 NOTE — Progress Notes (Signed)
*  PRELIMINARY RESULTS* Echocardiogram 2D Echocardiogram has been performed.  Gregory Crane 09/13/2017, 1:33 PM

## 2017-09-14 LAB — GLUCOSE, CAPILLARY
Glucose-Capillary: 245 mg/dL — ABNORMAL HIGH (ref 65–99)
Glucose-Capillary: 190 mg/dL — ABNORMAL HIGH (ref 65–99)

## 2017-09-14 MED ORDER — INSULIN GLARGINE 100 UNIT/ML ~~LOC~~ SOLN: 15.0000 [IU] | Freq: Every day | SUBCUTANEOUS | 0 refills | Status: DC

## 2017-09-14 MED ORDER — ATORVASTATIN CALCIUM 40 MG PO TABS: 40.0000 mg | ORAL_TABLET | Freq: Every day | ORAL | 0 refills | Status: DC

## 2017-09-14 MED ORDER — INSULIN ASPART 100 UNIT/ML ~~LOC~~ SOLN: 4.0000 [IU] | Freq: Three times a day (TID) | SUBCUTANEOUS | 0 refills | Status: DC

## 2017-09-14 MED ORDER — PREGABALIN 100 MG PO CAPS: 100.0000 mg | ORAL_CAPSULE | Freq: Three times a day (TID) | ORAL | Status: DC

## 2017-09-14 MED ORDER — ASPIRIN 325 MG PO TABS: 325.0000 mg | ORAL_TABLET | Freq: Every day | ORAL | 0 refills | Status: DC

## 2017-09-14 NOTE — Plan of Care (Signed)
  RD consulted for nutrition education regarding diabetes.   Lab Results  Component Value Date   HGBA1C 10.8 (H) 09/13/2017   Met with patient and his wife at bedside. He reports he was diagnosed with diabetes about 2-3 years ago. He did meet with an RD when he was first diagnosed, but he cannot recall a lot of what they discussed. A few years ago he tried to eat a very low-carb diet and he lost some weight, but it was hard to sustain and he has since gained the weight back. Patient here with CVA but denies any changes in appetite or difficulty chewing/swallowing. He typically eats 3 meals per day. For breakfast he has cereal at home or may have sausage egg and cheese biscuit from [place]. Lunch is usually leftovers from home or fast food. Dinner is cooked at home and may be spaghetti, enchiladas, or chicken with vegetables. He does not eat snacks during the day.  RD provided "Carbohydrate Counting for People with Diabetes" handout from the Academy of Nutrition and Dietetics. Discussed different food groups and their effects on blood sugar, emphasizing carbohydrate-containing foods. Provided list of carbohydrates and recommended serving sizes of common foods.  RD provided "Using Nutrition Labels: Carbohydrates" handout from the Academy of Nutrition and Dietetics. Discussed how to read a nutrition label including looking at serving size and servings per container. Reviewed that there are 15 grams of carbohydrate in one carbohydrate choice. Encouraged patient to use chart for range of carbohydrate grams per choice when reading nutrition labels.  Discussed importance of controlled and consistent carbohydrate intake throughout the day. Provided examples of ways to balance meals/snacks and encouraged intake of high-fiber, whole grain complex carbohydrates. Teach back method used.  Expect good compliance.  Body mass index is 37.17 kg/m. Pt meets criteria for Obesity Class II based on current  BMI.  Current diet order is Carbohydrate Modified, patient is consuming approximately 100% of meals at this time. Labs and medications reviewed. No further nutrition interventions warranted at this time. RD contact information provided. If additional nutrition issues arise, please re-consult RD.  Willey Blade, MS, [place], LDN Office: [phone] Pager: [phone] After Hours/Weekend Pager: [phone]

## 2017-09-14 NOTE — Discharge Summary (Signed)
[facility] [place] NAME: Gregory Crane    MR#:  [id number]  DATE OF BIRTH:  1971-03-08  DATE OF ADMISSION:  09/12/2017 ADMITTING PHYSICIAN: Amelia Jo, MD  DATE OF DISCHARGE: 09/14/2017 11:01 AM  PRIMARY CARE PHYSICIAN: [name], MD    ADMISSION DIAGNOSIS:  Stroke (cerebrum) ([place]) [I63.9] Cerebrovascular accident (CVA), unspecified mechanism ([place]) [I63.9]  DISCHARGE DIAGNOSIS:  Active Problems:   CVA (cerebral vascular accident) ([place])   SECONDARY DIAGNOSIS:   Past Medical History:  Diagnosis Date  . Allergy   . Bell's palsy   . Diabetes mellitus without complication (HCC)    diet controlled  . Hypertension   . Hypothyroidism   . Kidney stones   . Stroke [facility])     HOSPITAL COURSE:   1.  Acute to early subacute right basal ganglia corona radiata nonhemorrhagic infarct.  Aspirin and Lipitor prescribed.  Patient did well with physical therapy.  Patient was seen in consultation by neurology. 2.  Uncontrolled type 2 diabetes mellitus.  Hemoglobin A1c 10.8.  I started Lantus insulin 15 units nightly and short acting insulin prior to meals.  Patient also on metformin. 3.  Hypothyroidism unspecified on levothyroxine 4.  Essential hypertension on losartan 5.  Diabetic neuropathy on Lyrica 6.  Obesity.  Weight loss recommended  DISCHARGE CONDITIONS:   Satisfactory  CONSULTS OBTAINED:  Treatment Team:  Leotis Pain, MD  DRUG ALLERGIES:  No Known Allergies  DISCHARGE MEDICATIONS:   Allergies as of 09/14/2017   No Known Allergies     Medication List    STOP taking these medications   aspirin 81 MG EC tablet Replaced by:  aspirin 325 MG tablet   Testosterone 20.25 MG/ACT (1.62%) Gel     TAKE these medications   aspirin 325 MG tablet Take 1 tablet (325 mg total) by mouth daily. Start taking on:  09/15/2017 Replaces:  aspirin 81 MG EC tablet   atorvastatin 40 MG tablet Commonly known as:  LIPITOR Take 1  tablet (40 mg total) by mouth daily at 6 PM.   fluticasone 110 MCG/ACT inhaler Commonly known as:  FLOVENT HFA Inhale 2 puffs into the lungs 2 (two) times daily. Rinse mouth after use.   glucose blood test strip Commonly known as:  FREESTYLE LITE Check blood sugars twice a day (Dx. 250.02)   insulin aspart 100 UNIT/ML injection Commonly known as:  novoLOG Inject 4 Units into the skin 3 (three) times daily with meals.   insulin glargine 100 UNIT/ML injection Commonly known as:  LANTUS Inject 0.15 mLs (15 Units total) into the skin at bedtime.   levothyroxine 112 MCG tablet Commonly known as:  SYNTHROID Take 1 tablet (112 mcg total) by mouth daily before breakfast.   losartan 25 MG tablet Commonly known as:  COZAAR Take 1 tablet (25 mg total) by mouth daily.   metFORMIN 500 MG tablet Commonly known as:  GLUCOPHAGE Take 1 tablet (500 mg total) by mouth daily with breakfast.   pregabalin 100 MG capsule Commonly known as:  LYRICA Take 1 capsule (100 mg total) by mouth 3 (three) times daily.        DISCHARGE INSTRUCTIONS:   Follow-up with PMD 1 week  If you experience worsening of your admission symptoms, develop shortness of breath, life threatening emergency, suicidal or homicidal thoughts you must seek medical attention immediately by calling 911 or calling your MD immediately  if symptoms less severe.  You Must read complete instructions/literature along with  all the possible adverse reactions/side effects for all the Medicines you take and that have been prescribed to you. Take any new Medicines after you have completely understood and accept all the possible adverse reactions/side effects.   Please note  You were cared for by a hospitalist during your hospital stay. If you have any questions about your discharge medications or the care you received while you were in the hospital after you are discharged, you can call the unit and asked to speak with the hospitalist on  call if the hospitalist that took care of you is not available. Once you are discharged, your primary care physician will handle any further medical issues. Please note that NO REFILLS for any discharge medications will be authorized once you are discharged, as it is imperative that you return to your primary care physician (or establish a relationship with a primary care physician if you do not have one) for your aftercare needs so that they can reassess your need for medications and monitor your lab values.    Today   CHIEF COMPLAINT:   Chief Complaint  Patient presents with  . Dizziness  . Altered Mental Status    HISTORY OF PRESENT ILLNESS:  Gregory Crane  is a 47 y.o. male came in with dizziness altered mental status and some slurred speech   VITAL SIGNS:  Blood pressure 132/73, pulse 73, temperature 98.1 F (36.7 C), temperature source Oral, resp. rate 18, height 6' (1.829 m), weight 124.3 kg (274 lb 1.6 oz), SpO2 94 %.    PHYSICAL EXAMINATION:  GENERAL:  47 y.o.-year-old patient lying in the bed with no acute distress.  EYES: Pupils equal, round, reactive to light and accommodation. No scleral icterus. Extraocular muscles intact.  HEENT: Head atraumatic, normocephalic. Oropharynx and nasopharynx clear.  NECK:  Supple, no jugular venous distention. No thyroid enlargement, no tenderness.  LUNGS: Normal breath sounds bilaterally, no wheezing, rales,rhonchi or crepitation. No use of accessory muscles of respiration.  CARDIOVASCULAR: S1, S2 normal. No murmurs, rubs, or gallops.  ABDOMEN: Soft, non-tender, non-distended. Bowel sounds present. No organomegaly or mass.  EXTREMITIES: No pedal edema, cyanosis, or clubbing.  NEUROLOGIC: Cranial nerves II through XII are intact. Muscle strength 5/5 in all extremities. Sensation intact. Gait not checked.  PSYCHIATRIC: The patient is alert and oriented x 3.  SKIN: No obvious rash, lesion, or ulcer.   DATA REVIEW:   CBC Recent Labs  Lab  09/13/17 0555  WBC 5.0  HGB 14.7  HCT 42.9  PLT 172    Chemistries  Recent Labs  Lab 09/12/17 1444 09/13/17 0555  NA 136 136  K 4.7 4.4  CL 101 103  CO2 24 26  GLUCOSE 265* 253*  BUN 21* 20  CREATININE 1.15 1.12  CALCIUM 9.3 8.5*  AST 20  --   ALT 15*  --   ALKPHOS 80  --   BILITOT 1.0  --     Cardiac Enzymes Recent Labs  Lab 09/12/17 1444  TROPONINI <0.03     RADIOLOGY:  Dg Chest 2 View  Result Date: 09/12/2017 CLINICAL DATA:  Nonproductive cough 3-4 weeks EXAM: CHEST  2 VIEW COMPARISON:  01/09/2017 FINDINGS: The heart size and mediastinal contours are within normal limits. Both lungs are clear. The visualized skeletal structures are unremarkable. IMPRESSION: No active cardiopulmonary disease. Electronically Signed   By: Franchot Gallo M.D.   On: 09/12/2017 18:15   Ct Head Wo Contrast  Result Date: 09/12/2017 CLINICAL DATA:  Slurred speech, confusion,  dizziness and altered level of consciousness on 09/10/2007. EXAM: CT HEAD WITHOUT CONTRAST TECHNIQUE: Contiguous axial images were obtained from the base of the skull through the vertex without intravenous contrast. COMPARISON:  Brain MRI and head CT 10/20/2016. FINDINGS: Brain: There is a new focus of hypoattenuation in the periventricular deep white matter on the right most consistent with a deep white matter infarct which appears remote. Tiny lacunar infarction in the left basal ganglia is unchanged. No hemorrhage, midline shift, mass lesion, mass effect or abnormal extra-axial fluid collection. No hydrocephalus or pneumocephalus. Vascular: No hyperdense vessel or unexpected calcification. Skull: Intact. Sinuses/Orbits: Negative. Other: None. IMPRESSION: Since the prior exams, the patient has suffered a periventricular deep white matter infarct on the right. The infarct appears remote. No acute abnormality is identified. Electronically Signed   By: Inge Rise M.D.   On: 09/12/2017 15:29   Mr Brain Wo  Contrast  Result Date: 09/12/2017 CLINICAL DATA:  Slurred speech, confusion, dizziness and mental fogging for 4 days. EXAM: MRI HEAD WITHOUT CONTRAST TECHNIQUE: Multiplanar, multiecho pulse sequences of the brain and surrounding structures were obtained without intravenous contrast. COMPARISON:  CT HEAD September 12, 2017 at 1519 hours and MRI of the head October 20, 2016 FINDINGS: BRAIN: 22 x 11 mm reduced diffusion RIGHT corona radiata to RIGHT basal ganglia with corresponding low to early normalization of ADC values. No susceptibility artifact to suggest hemorrhage. Ventricles and sulci are normal for patient's age. Old LEFT globus pallidus lacunar infarct. No midline shift, mass effect or masses. No abnormal extra-axial fluid collections. VASCULAR: Dolichoectatic major intracranial vascular flow voids present at skull base associated with chronic hypertension. SKULL AND UPPER CERVICAL SPINE: No abnormal sellar expansion. No suspicious calvarial bone marrow signal. Craniocervical junction maintained. SINUSES/ORBITS: Included paranasal sinuses are well aerated. Imaged mastoid air cells are well aerated. The included ocular globes and orbital contents are non-suspicious. OTHER: None. IMPRESSION: 1. Acute to early subacute RIGHT basal ganglia/corona radiata nonhemorrhagic infarct. 2. Old LEFT basal ganglia lacunar infarct. 3. Acute findings discussed with and reconfirmed by Dr.JAMES MCSHANE on 09/12/2017 at 9:38 pm. Electronically Signed   By: Elon Alas M.D.   On: 09/12/2017 21:40   US Carotid Bilateral  Result Date: 09/13/2017 CLINICAL DATA:  Small acute right basal ganglia infarct EXAM: BILATERAL CAROTID DUPLEX ULTRASOUND TECHNIQUE: Pearline Cables scale imaging, color Doppler and duplex ultrasound were performed of bilateral carotid and vertebral arteries in the neck. COMPARISON:  09/12/2017 FINDINGS: Criteria: Quantification of carotid stenosis is based on velocity parameters that correlate the residual  internal carotid diameter with NASCET-based stenosis levels, using the diameter of the distal internal carotid lumen as the denominator for stenosis measurement. The following velocity measurements were obtained: RIGHT ICA:  82/28 cm/sec CCA:  32/35 cm/sec SYSTOLIC ICA/CCA RATIO:  0.9 DIASTOLIC ICA/CCA RATIO:  1.6 ECA:  126 cm/sec LEFT ICA:  40/13 cm/sec CCA:  57/3 cm/sec SYSTOLIC ICA/CCA RATIO:  0.5 DIASTOLIC ICA/CCA RATIO:  2.2 ECA:  101 cm/sec RIGHT CAROTID ARTERY: Minor intimal thickening without significant atherosclerotic plaque formation. No hemodynamically significant right ICA stenosis, velocity elevation, or turbulent flow. Degree of narrowing less than 50%. RIGHT VERTEBRAL ARTERY:  Antegrade LEFT CAROTID ARTERY: Minor atherosclerotic plaque formation. No hemodynamically significant left ICA stenosis, velocity elevation, or turbulent flow. LEFT VERTEBRAL ARTERY:  Antegrade IMPRESSION: Minor intimal thickening and carotid atherosclerosis. No hemodynamically significant ICA stenosis. Degree of narrowing less than 50% bilaterally. Patent antegrade vertebral flow bilaterally Electronically Signed   By: Jerilynn Mages.  Shick M.D.  On: 09/13/2017 12:03    Management plans discussed with the patient, family and they are in agreement.  CODE STATUS:     Code Status Orders  (From admission, onward)        Start     Ordered   09/12/17 2348  Full code  Continuous     09/12/17 2347    Code Status History    Date Active Date Inactive Code Status Order ID Comments User Context   10/20/2016 20:44 10/21/2016 15:29 Full Code [id number]  Gladstone Lighter, MD Inpatient      TOTAL TIME TAKING CARE OF THIS PATIENT: 35 minutes.    Loletha Grayer M.D on 09/14/2017 at 1:30 PM  Between 7am to 6pm - Pager - [phone]  After 6pm go to [email or web address] - password [email or web address]  [facility] Office  [phone]  CC: Primary care physician; [name], MD

## 2017-09-14 NOTE — Progress Notes (Signed)
Pt discharged to home. IV & heart monitor removed. Discharge education and instructions reviewed with patient and wife. Pt ambulatory st time of d/c.

## 2017-09-14 NOTE — Progress Notes (Signed)
Patient ID: Gregory Crane, male   DOB: Jun 20, 1971, 47 y.o.   MRN: [id number] [place] [facility] was admitted to the [facility] on 09/12/2017 and Discharged  09/14/2017 and should be excused from work/school   for 6 days starting 09/12/2017 , may return to work/school without any restrictions.  Loletha Grayer M.D on 09/14/2017,at 9:17 AM  [place] [facility]  [phone]

## 2017-09-16 ENCOUNTER — Encounter: Payer: Self-pay | Admitting: Internal Medicine

## 2017-09-16 ENCOUNTER — Ambulatory Visit: Payer: Managed Care, Other (non HMO) | Admitting: Internal Medicine

## 2017-09-16 DIAGNOSIS — E11319 Type 2 diabetes mellitus with unspecified diabetic retinopathy without macular edema: Secondary | ICD-10-CM

## 2017-09-16 DIAGNOSIS — E785 Hyperlipidemia, unspecified: Secondary | ICD-10-CM

## 2017-09-16 DIAGNOSIS — I639 Cerebral infarction, unspecified: Secondary | ICD-10-CM

## 2017-09-16 DIAGNOSIS — I10 Essential (primary) hypertension: Secondary | ICD-10-CM | POA: Diagnosis not present

## 2017-09-16 NOTE — Progress Notes (Signed)
Patient ID: Gregory Crane, male   DOB: 03/14/1971, 47 y.o.   MRN: [id number]   Subjective:    Patient ID: Gregory Crane, male    DOB: [birth year], 48 y.o.   MRN: [id number]  HPI  Patient here for hospital follow up.  He was initially seen in ER 09/10/17.  Note reviewed.  Found to have elevated sugar and was started back on metformin.  Went back to the ER 09/12/17 with c/o "feeling weird".  C/o fogginess on his brain.  No focal motor deficits appreciated.  Was diagnosed with acute to early subacute right basal ganglia corona radiata non hemorrhagic infarct.  Neurology was consulted.  Placed on lipitor and aspirin.  Did well with physical therapy.   Was also started on insulin given his poorly controlled sugars.  He comes in today accompanied by his wife.  History obtained from both of them.  He is doing better.  Not completely back to his baseline, but does feel better.  Some fatigue.  No chest pain. No sob. No acid reflux.  No abdominal pain.  Bowels moving.  pts wife states that he is just slower to respond.  Discussed at length with him today regarding his elevated sugars.  He has been hesitant to take any medication in the past.  Discussed importance of diet and exercise.  Sugars remain elevated.  Recent checks: 190-240.     Past Medical History:  Diagnosis Date  . Allergy   . Bell's palsy   . Diabetes mellitus without complication (HCC)    diet controlled  . Hypertension   . Hypothyroidism   . Kidney stones   . Stroke [facility])    Past Surgical History:  Procedure Laterality Date  . LUMBAR PUNCTURE     as child  . NO PAST SURGERIES     Family History  Problem Relation Age of Onset  . Breast cancer Mother   . Diabetes Father   . Diabetes Sister   . Arthritis Maternal Grandmother   . Diabetes Maternal Grandmother    Social History   Socioeconomic History  . Marital status: Married    Spouse name: [name]  . Number of children: 2  . Years of education: None  . Highest education  level: None  Social Needs  . Financial resource strain: Not hard at all  . Food insecurity - worry: Never true  . Food insecurity - inability: Never true  . Transportation needs - medical: No  . Transportation needs - non-medical: No  Occupational History  . None  Tobacco Use  . Smoking status: Never Smoker  . Smokeless tobacco: Never Used  Substance and Sexual Activity  . Alcohol use: No    Alcohol/week: 0.0 oz    Comment: very rarely (1-2/year)  . Drug use: No  . Sexual activity: None  Other Topics Concern  . None  Social History Narrative   Lives at home with wife, independent at baseline.    Outpatient Encounter Medications as of 09/16/2017  Medication Sig  . aspirin 325 MG tablet Take 1 tablet (325 mg total) by mouth daily.  Marland Kitchen atorvastatin (LIPITOR) 40 MG tablet Take 1 tablet (40 mg total) by mouth daily at 6 PM.  . glucose blood (FREESTYLE LITE) test strip Check blood sugars twice a day (Dx. 250.02)  . insulin aspart (NOVOLOG) 100 UNIT/ML injection Inject 4 Units into the skin 3 (three) times daily with meals.  . insulin glargine (LANTUS) 100 UNIT/ML injection Inject 0.15 mLs (15  Units total) into the skin at bedtime.  Marland Kitchen levothyroxine (SYNTHROID) 112 MCG tablet Take 1 tablet (112 mcg total) by mouth daily before breakfast.  . losartan (COZAAR) 25 MG tablet Take 1 tablet (25 mg total) by mouth daily.  . metFORMIN (GLUCOPHAGE) 500 MG tablet Take 1 tablet (500 mg total) by mouth daily with breakfast.  . pregabalin (LYRICA) 100 MG capsule Take 1 capsule (100 mg total) by mouth 3 (three) times daily.  . fluticasone (FLOVENT HFA) 110 MCG/ACT inhaler Inhale 2 puffs into the lungs 2 (two) times daily. Rinse mouth after use. (Patient not taking: Reported on 09/12/2017)   No facility-administered encounter medications on file as of 09/16/2017.     Review of Systems  Constitutional: Positive for fatigue. Negative for unexpected weight change.  HENT: Negative for congestion and sinus  pressure.   Respiratory: Negative for cough, chest tightness and shortness of breath.   Cardiovascular: Negative for chest pain, palpitations and leg swelling.  Gastrointestinal: Negative for abdominal pain, diarrhea, nausea and vomiting.  Genitourinary: Negative for difficulty urinating and dysuria.  Musculoskeletal: Negative for joint swelling and myalgias.  Skin: Negative for color change and rash.  Neurological: Negative for dizziness, light-headedness and headaches.  Psychiatric/Behavioral: Negative for agitation and dysphoric mood.       Objective:    Physical Exam  Constitutional: He appears well-developed and well-nourished. No distress.  HENT:  Nose: Nose normal.  Mouth/Throat: Oropharynx is clear and moist.  Neck: Neck supple. No thyromegaly present.  Cardiovascular: Normal rate and regular rhythm.  Pulmonary/Chest: Effort normal and breath sounds normal. No respiratory distress.  Abdominal: Soft. Bowel sounds are normal. There is no tenderness.  Musculoskeletal: He exhibits no edema or tenderness.  Lymphadenopathy:    He has no cervical adenopathy.  Skin: No rash noted. No erythema.  Psychiatric: He has a normal mood and affect. His behavior is normal.    BP 130/88 (BP Location: Left Arm, Patient Position: Sitting, Cuff Size: Large)   Pulse 77   Temp 97.6 F (36.4 C) (Oral)   Resp 16   Ht 6' (1.829 m)   Wt 274 lb (124.3 kg)   SpO2 97%   BMI 37.16 kg/m  Wt Readings from Last 3 Encounters:  09/16/17 274 lb (124.3 kg)  09/14/17 274 lb 1.6 oz (124.3 kg)  09/10/17 270 lb (122.5 kg)     Lab Results  Component Value Date   WBC 5.0 09/13/2017   HGB 14.7 09/13/2017   HCT 42.9 09/13/2017   PLT 172 09/13/2017   GLUCOSE 253 (H) 09/13/2017   CHOL 181 09/13/2017   TRIG 231 (H) 09/13/2017   HDL 30 (L) 09/13/2017   LDLCALC 105 (H) 09/13/2017   ALT 15 (L) 09/12/2017   AST 20 09/12/2017   NA 136 09/13/2017   K 4.4 09/13/2017   CL 103 09/13/2017   CREATININE  1.12 09/13/2017   BUN 20 09/13/2017   CO2 26 09/13/2017   TSH 7.970 (H) 03/25/2017   INR 0.96 09/12/2017   HGBA1C 10.8 (H) 09/13/2017    US Carotid Bilateral  Result Date: 09/13/2017 CLINICAL DATA:  Small acute right basal ganglia infarct EXAM: BILATERAL CAROTID DUPLEX ULTRASOUND TECHNIQUE: Pearline Cables scale imaging, color Doppler and duplex ultrasound were performed of bilateral carotid and vertebral arteries in the neck. COMPARISON:  09/12/2017 FINDINGS: Criteria: Quantification of carotid stenosis is based on velocity parameters that correlate the residual internal carotid diameter with NASCET-based stenosis levels, using the diameter of the distal internal carotid lumen  as the denominator for stenosis measurement. The following velocity measurements were obtained: RIGHT ICA:  82/28 cm/sec CCA:  01/09 cm/sec SYSTOLIC ICA/CCA RATIO:  0.9 DIASTOLIC ICA/CCA RATIO:  1.6 ECA:  126 cm/sec LEFT ICA:  40/13 cm/sec CCA:  32/3 cm/sec SYSTOLIC ICA/CCA RATIO:  0.5 DIASTOLIC ICA/CCA RATIO:  2.2 ECA:  101 cm/sec RIGHT CAROTID ARTERY: Minor intimal thickening without significant atherosclerotic plaque formation. No hemodynamically significant right ICA stenosis, velocity elevation, or turbulent flow. Degree of narrowing less than 50%. RIGHT VERTEBRAL ARTERY:  Antegrade LEFT CAROTID ARTERY: Minor atherosclerotic plaque formation. No hemodynamically significant left ICA stenosis, velocity elevation, or turbulent flow. LEFT VERTEBRAL ARTERY:  Antegrade IMPRESSION: Minor intimal thickening and carotid atherosclerosis. No hemodynamically significant ICA stenosis. Degree of narrowing less than 50% bilaterally. Patent antegrade vertebral flow bilaterally Electronically Signed   By: Jerilynn Mages.  Shick M.D.   On: 09/13/2017 12:03       Assessment & Plan:   Problem List Items Addressed This Visit    CVA (cerebral vascular accident) [facility])    Recently admitted with CVA as outlined.  Gradually improving.  MRI as outlined.  Carotid as  outlined.  No significant stenosis.  Continue daily aspirin.  Continue risk factor modification.  Have to get sugars under better control.  Schedule w/u with neurology.  They evaluated in hospital.  Remain out of work over the next week.        Diabetes ([place])    Has been poorly controlled.  He has been non compliant with medication.  On insulin now.  Discussed diet and exercise.  Discussed the need for weight loss.  He has been more serious since his discharge regarding his diet.  Plans to start exercising.  Follow.  Will send in readings over the next 1-2 weeks.  Will probably need to adjust his insulin.       Essential hypertension    Blood pressure a little elevated today.  Hold on additional medication.  Follow.        Hyperlipidemia    Now on lipitor.  Low cholesterol diet and exercise.  Follow lipid panel and liver function tests.            [name], MD

## 2017-09-19 ENCOUNTER — Encounter: Payer: Self-pay | Admitting: Internal Medicine

## 2017-09-19 NOTE — Assessment & Plan Note (Signed)
Blood pressure a little elevated today.  Hold on additional medication.  Follow.

## 2017-09-19 NOTE — Assessment & Plan Note (Addendum)
Has been poorly controlled.  He has been non compliant with medication.  On insulin now.  Discussed diet and exercise.  Discussed the need for weight loss.  He has been more serious since his discharge regarding his diet.  Plans to start exercising.  Follow.  Will send in readings over the next 1-2 weeks.  Will probably need to adjust his insulin.

## 2017-09-19 NOTE — Assessment & Plan Note (Signed)
Recently admitted with CVA as outlined.  Gradually improving.  MRI as outlined.  Carotid as outlined.  No significant stenosis.  Continue daily aspirin.  Continue risk factor modification.  Have to get sugars under better control.  Schedule w/u with neurology.  They evaluated in hospital.  Remain out of work over the next week.

## 2017-09-19 NOTE — Assessment & Plan Note (Signed)
Now on lipitor.  Low cholesterol diet and exercise.  Follow lipid panel and liver function tests.

## 2017-09-25 ENCOUNTER — Ambulatory Visit: Payer: Managed Care, Other (non HMO) | Admitting: Internal Medicine

## 2017-09-25 ENCOUNTER — Encounter: Payer: Self-pay | Admitting: Internal Medicine

## 2017-09-25 VITALS — BP 130/80 | HR 77 | Resp 16 | Ht 72.0 in | Wt 276.0 lb

## 2017-09-25 DIAGNOSIS — G4719 Other hypersomnia: Secondary | ICD-10-CM

## 2017-09-25 DIAGNOSIS — J4541 Moderate persistent asthma with (acute) exacerbation: Secondary | ICD-10-CM | POA: Diagnosis not present

## 2017-09-25 NOTE — Progress Notes (Signed)
[facility] Consultation      Assessment and Plan:  Excessive daytime sleepiness. -Symptoms and signs of obstructive sleep apnea, will send for sleep study, start on CPAP if indicated.  Cerebrovascular accident. - Sleep apnea can contribute to stroke, therefore treatment of the patient's sleep apnea is important if present.  Chronic cough, likely secondary to allergic asthma -Appears to have resolved with empiric inhaler, which has now been stopped. -Continue to follow, he is asked to call us back if he develops any recurrent symptoms we can restart on a nasal steroid and a Spiriva inhaler once again.  Obesity with atelectasis. -Recommended weight loss, he is planning on starting diet plan.  Orders Placed This Encounter  Procedures  . Home sleep test   Return in about 3 months (around 12/23/2017).   Date: 09/25/2017  MRN# [id number] Luis Nickles Rentfrow 23-Jul-1971   Elijan Googe is a 47 y.o. old male seen in consultation for chief complaint of:    Chief Complaint  Patient presents with  . Asthma    pt states cough resolved he is not on any inhalers. Pt denies any pulm sx.  . Cerebrovascular Accident    pt states he has a stroke 2 weeks ago.  . Wheezing    HPI:  Patient is a 47 year old male, he was last seen here with symptoms of chronic cough, asthmatic bronchitis, and allergic rhinitis.  At last visit he was asked to remove pets from his bedroom, started daily antihistamine, start a nasal steroid and given a sample of Spiriva Respimat, and asked to call back for prescription if it was helping.  By the time the sample completed he was feeling better, so he did not call for a prescription. He is no longer on antihistamine or nasal spray. 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    CXR images personally reviewed 01/09/17; unremarkable chest film.   Medication:    Current Outpatient Medications:  .  aspirin 325 MG  tablet, Take 1 tablet (325 mg total) by mouth daily., Disp: 30 tablet, Rfl: 0 .  atorvastatin (LIPITOR) 40 MG tablet, Take 1 tablet (40 mg total) by mouth daily at 6 PM., Disp: 30 tablet, Rfl: 0 .  fluticasone (FLOVENT HFA) 110 MCG/ACT inhaler, Inhale 2 puffs into the lungs 2 (two) times daily. Rinse mouth after use. (Patient not taking: Reported on 09/12/2017), Disp: 1 Inhaler, Rfl: 1 .  glucose blood (FREESTYLE LITE) test strip, Check blood sugars twice a day (Dx. 250.02), Disp: 200 each, Rfl: 1 .  insulin aspart (NOVOLOG) 100 UNIT/ML injection, Inject 4 Units into the skin 3 (three) times daily with meals., Disp: 10 mL, Rfl: 0 .  insulin glargine (LANTUS) 100 UNIT/ML injection, Inject 0.15 mLs (15 Units total) into the skin at bedtime., Disp: 10 mL, Rfl: 0 .  levothyroxine (SYNTHROID) 112 MCG tablet, Take 1 tablet (112 mcg total) by mouth daily before breakfast., Disp: 90 tablet, Rfl: 1 .  losartan (COZAAR) 25 MG tablet, Take 1 tablet (25 mg total) by mouth daily., Disp: 90 tablet, Rfl: 1 .  metFORMIN (GLUCOPHAGE) 500 MG tablet, Take 1 tablet (500 mg total) by mouth daily with breakfast., Disp: 30 tablet, Rfl: 0 .  pregabalin (LYRICA) 100 MG capsule, Take 1 capsule (100 mg total) by mouth 3 (three) times daily., Disp: , Rfl:    Allergies:  Patient has no known allergies.  Review of Systems: Gen:  Denies  fever, sweats, chills HEENT: Denies blurred vision, double vision. bleeds, sore throat Cvc:  No dizziness, chest pain. Resp:   Denies cough or sputum production, shortness of breath Gi: Denies swallowing difficulty, stomach pain. Gu:  Denies bladder incontinence, burning urine Ext:   No Joint pain, stiffness. Skin: No skin rash,  hives  Endoc:  No polyuria, polydipsia. Psych: No depression, insomnia. Other:  All other systems were reviewed with the patient and were negative other that what is mentioned in the HPI.   Physical Examination:   VS: There were no vitals taken for this  visit.  General Appearance: No distress  Neuro:without focal findings,  speech normal,  HEENT: PERRLA, EOM intact.   Pulmonary: normal breath sounds, No wheezing.  CardiovascularNormal S1,S2.  No m/r/g.   Abdomen: Benign, Soft, non-tender. Renal:  No costovertebral tenderness  GU:  No performed at this time. Endoc: No evident thyromegaly, no signs of acromegaly. Skin:   warm, no rashes, no ecchymosis  Extremities: normal, no cyanosis, clubbing.  Other findings:    LABORATORY PANEL:   CBC No results for input(s): WBC, HGB, HCT, PLT in the last 168 hours. ------------------------------------------------------------------------------------------------------------------  Chemistries  No results for input(s): NA, K, CL, CO2, GLUCOSE, BUN, CREATININE, CALCIUM, MG, AST, ALT, ALKPHOS, BILITOT in the last 168 hours.  Invalid input(s): GFRCGP ------------------------------------------------------------------------------------------------------------------  Cardiac Enzymes No results for input(s): TROPONINI in the last 168 hours. ------------------------------------------------------------  RADIOLOGY:  No results found.     Thank  you for the consultation and for allowing [facility] to assist in the care of your patient. Our recommendations are noted above.  Please contact us if we can be of further service.   Marda Stalker, MD.  Board Certified in Internal Medicine, Pulmonary Medicine, [place], and Sleep Medicine.  [facility] Office Number: [phone]  Patricia Pesa, M.D.  Merton Border, M.D  09/25/2017

## 2017-09-25 NOTE — Patient Instructions (Addendum)
Will send for sleep study. If started on cpap will see you back within 3 months, if test is negative can follow up as needed.     Sleep Apnea Sleep apnea is disorder that affects a person's sleep. A person with sleep apnea has abnormal pauses in their breathing when they sleep. It is hard for them to get a good sleep. This makes a person tired during the day. It also can lead to other physical problems. There are three types of sleep apnea. One type is when breathing stops for a short time because your airway is blocked (obstructive sleep apnea). Another type is when the brain sometimes fails to give the normal signal to breathe to the muscles that control your breathing (central sleep apnea). The third type is a combination of the other two types. HOME CARE   Take all medicine as told by your doctor.  Avoid alcohol, calming medicines (sedatives), and depressant drugs.  Try to lose weight if you are overweight. Talk to your doctor about a healthy weight goal.  Your doctor may have you use a device that helps to open your airway. It can help you get the air that you need. It is called a positive airway pressure (PAP) device.   MAKE SURE YOU:   Understand these instructions.  Will watch your condition.  Will get help right away if you are not doing well or get worse.  It may take approximately 1 month for you to get used to wearing her CPAP every night.  Be sure to work with your machine to get used to it, be patient, it may take time!

## 2017-09-26 ENCOUNTER — Other Ambulatory Visit: Payer: Self-pay | Admitting: Internal Medicine

## 2017-10-08 ENCOUNTER — Telehealth: Payer: Self-pay | Admitting: Internal Medicine

## 2017-10-08 ENCOUNTER — Other Ambulatory Visit: Payer: Self-pay

## 2017-10-08 MED ORDER — "INSULIN SYRINGE-NEEDLE U-100 27G X 1/2"" 1 ML MISC": 2 refills | Status: DC

## 2017-10-08 NOTE — Telephone Encounter (Signed)
Needles sent to pharmacy. Left message for patient.

## 2017-10-08 NOTE — Telephone Encounter (Signed)
Copied from [place] [id number]. Topic: Quick Communication - Rx Refill/Question >> Oct 08, 2017 11:04 AM Margot Ables wrote: Medication: pt needing insulin syringes sent in - pt has enough for today only  Has the patient contacted their pharmacy? Yes.  No refills Preferred Pharmacy (with phone number or street name): [facility] [place], [place] [address] [place] [address] Phone: [phone] Fax: [phone]     Agent: Please be advised that RX refills may take up to 3 business days. We ask that you follow-up with your pharmacy.

## 2017-10-13 ENCOUNTER — Encounter: Payer: Self-pay | Admitting: Internal Medicine

## 2017-10-13 NOTE — Telephone Encounter (Signed)
Message sent to clarify which pharmacy.

## 2017-10-14 MED ORDER — ATORVASTATIN CALCIUM 40 MG PO TABS: 40.0000 mg | ORAL_TABLET | Freq: Every day | ORAL | 1 refills | Status: DC

## 2017-10-14 MED ORDER — METFORMIN HCL 500 MG PO TABS: 500.0000 mg | ORAL_TABLET | Freq: Every day | ORAL | 1 refills | Status: DC

## 2017-10-14 NOTE — Telephone Encounter (Signed)
rx's sent in to [facility].

## 2017-10-24 ENCOUNTER — Telehealth: Payer: Self-pay | Admitting: Internal Medicine

## 2017-10-24 ENCOUNTER — Ambulatory Visit: Payer: Managed Care, Other (non HMO) | Admitting: Internal Medicine

## 2017-10-24 DIAGNOSIS — I10 Essential (primary) hypertension: Secondary | ICD-10-CM | POA: Diagnosis not present

## 2017-10-24 DIAGNOSIS — E039 Hypothyroidism, unspecified: Secondary | ICD-10-CM | POA: Diagnosis not present

## 2017-10-24 DIAGNOSIS — I639 Cerebral infarction, unspecified: Secondary | ICD-10-CM | POA: Diagnosis not present

## 2017-10-24 DIAGNOSIS — E785 Hyperlipidemia, unspecified: Secondary | ICD-10-CM | POA: Diagnosis not present

## 2017-10-24 DIAGNOSIS — E11319 Type 2 diabetes mellitus with unspecified diabetic retinopathy without macular edema: Secondary | ICD-10-CM | POA: Diagnosis not present

## 2017-10-24 MED ORDER — LEVOTHYROXINE SODIUM 112 MCG PO TABS: 112.0000 ug | ORAL_TABLET | Freq: Every day | ORAL | 1 refills | Status: DC

## 2017-10-24 MED ORDER — INSULIN GLARGINE 100 UNIT/ML ~~LOC~~ SOLN: 15.0000 [IU] | Freq: Every day | SUBCUTANEOUS | 0 refills | Status: DC

## 2017-10-24 MED ORDER — INSULIN LISPRO 100 UNIT/ML ~~LOC~~ SOLN: 4.0000 [IU] | Freq: Three times a day (TID) | SUBCUTANEOUS | 2 refills | Status: DC

## 2017-10-24 MED ORDER — PREGABALIN 100 MG PO CAPS: 100.0000 mg | ORAL_CAPSULE | Freq: Three times a day (TID) | ORAL | 2 refills | Status: DC

## 2017-10-24 NOTE — Telephone Encounter (Signed)
Last OV: 10/24/17 PCP: [facility]: [place], [place] [address] [phone] (Phone) [phone] (Fax)   Hassan Rowan with [facility] called to make this request. Brenda's phone number is [phone]. The ref # for this request is [id number].

## 2017-10-24 NOTE — Telephone Encounter (Signed)
Copied from [place] [id number]. Topic: Quick Communication - Rx Refill/Question >> Oct 24, 2017  9:16 AM Pricilla Handler wrote: Medication: Pregabalin (LYRICA) 100 MG capsule Has the patient contacted their pharmacy? Yes.   (Agent: If no, request that the patient contact the pharmacy for the refill.) Preferred Pharmacy (with phone number or street name): [place], [place] [address] [phone] (Phone) [phone] (Fax)  Hassan Rowan with [facility] called to make this request. Brenda's phone number is [phone]. The ref # for this request is [id number]. Agent: Please be advised that RX refills may take up to 3 business days. We ask that you follow-up with your pharmacy.

## 2017-10-24 NOTE — Telephone Encounter (Signed)
Copied from [place] [id number]. Topic: Quick Communication - Rx Refill/Question >> Oct 24, 2017  9:16 AM Pricilla Handler wrote: Medication: Pregabalin (LYRICA) 100 MG capsule Has the patient contacted their pharmacy? Yes.   (Agent: If no, request that the patient contact the pharmacy for the refill.) Preferred Pharmacy (with phone number or street name): [place], [place] [address] [phone] (Phone) [phone] (Fax)  Hassan Rowan with [facility] called to make this request. Brenda's phone number is [phone]. The ref # for this request is [id number]. Agent: Please be advised that RX refills may take up to 3 business days. We ask that you follow-up with your pharmacy.

## 2017-10-24 NOTE — Progress Notes (Signed)
Patient ID: Gregory Crane, male   DOB: 01/28/1971, 47 y.o.   MRN: [id number]   Subjective:    Patient ID: Gregory Crane, male    DOB: 18-Aug-1971, 47 y.o.   MRN: [id number]  HPI  Patient here for a scheduled follow up.  He is accompanied by his wife.  History obtained from both of them.  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  On lipitor and aspirin.  Since his discharge, he has adjusted his diet.  Has lost 5 pounds.  Sugars much improved.  Feeling better.  Able to process his thoughts better. Wife reports responds faster, etc.  Still reports increased daytime somnolence and fatigue.  Saw pulmonary.  Ordered home sleep test.  Still has issues with picking up objects and tying his shoes, etc.  Discussed OT referral.  He is in agreement.  States sugars are averaging 130-170 in the am and 120-130 in the pm.  Increased stress with work and their financial situation.  Overall he feels he is handling things relatively well.    Past Medical History:  Diagnosis Date  . Allergy   . Bell's palsy   . Diabetes mellitus without complication (HCC)    diet controlled  . Hypertension   . Hypothyroidism   . Kidney stones   . Stroke [facility])    Past Surgical History:  Procedure Laterality Date  . LUMBAR PUNCTURE     as child  . NO PAST SURGERIES     Family History  Problem Relation Age of Onset  . Breast cancer Mother   . Diabetes Father   . Diabetes Sister   . Arthritis Maternal Grandmother   . Diabetes Maternal Grandmother    Social History   Socioeconomic History  . Marital status: Married    Spouse name: [name]  . Number of children: 2  . Years of education: None  . Highest education level: None  Social Needs  . Financial resource strain: Not hard at all  . Food insecurity - worry: Never true  . Food insecurity - inability: Never true  . Transportation needs - medical: No  . Transportation needs - non-medical: No  Occupational History  . None    Tobacco Use  . Smoking status: Never Smoker  . Smokeless tobacco: Never Used  Substance and Sexual Activity  . Alcohol use: No    Alcohol/week: 0.0 oz    Comment: very rarely (1-2/year)  . Drug use: No  . Sexual activity: None  Other Topics Concern  . None  Social History Narrative   Lives at home with wife, independent at baseline.    Outpatient Encounter Medications as of 10/24/2017  Medication Sig  . aspirin 325 MG tablet Take 1 tablet (325 mg total) by mouth daily.  Marland Kitchen atorvastatin (LIPITOR) 40 MG tablet Take 1 tablet (40 mg total) by mouth daily at 6 PM.  . glucose blood (FREESTYLE LITE) test strip Check blood sugars twice a day (Dx. 250.02)  . insulin glargine (LANTUS) 100 UNIT/ML injection Inject 0.15 mLs (15 Units total) into the skin at bedtime.  . insulin lispro (HUMALOG) 100 UNIT/ML injection Inject 0.04 mLs (4 Units total) into the skin 3 (three) times daily.  . Insulin Syringe-Needle U-100 27G X 1/2" 1 ML MISC Use as directed with insulin. One needle per use.  . levothyroxine (SYNTHROID) 112 MCG tablet Take 1 tablet (112 mcg total) by mouth daily before breakfast.  . losartan (COZAAR) 50 MG tablet Take  1 tablet (50 mg total) by mouth daily.  . metFORMIN (GLUCOPHAGE) 500 MG tablet Take 1 tablet (500 mg total) by mouth daily with breakfast.  . ONETOUCH DELICA LANCETS FINE MISC 1 applicator by Other route 4 (four) times daily.  . pregabalin (LYRICA) 100 MG capsule Take 1 capsule (100 mg total) by mouth 3 (three) times daily.  . [DISCONTINUED] insulin aspart (NOVOLOG) 100 UNIT/ML injection Inject 4 Units into the skin 3 (three) times daily with meals.  . [DISCONTINUED] insulin glargine (LANTUS) 100 UNIT/ML injection Inject 0.15 mLs (15 Units total) into the skin at bedtime.  . [DISCONTINUED] insulin lispro (HUMALOG) 100 UNIT/ML injection Inject 4 Units into the skin 3 (three) times daily.  . [DISCONTINUED] levothyroxine (SYNTHROID) 112 MCG tablet Take 1 tablet (112 mcg total)  by mouth daily before breakfast.  . [DISCONTINUED] losartan (COZAAR) 25 MG tablet TAKE 1 TABLET BY MOUTH  DAILY  . [DISCONTINUED] pregabalin (LYRICA) 100 MG capsule Take 1 capsule (100 mg total) by mouth 3 (three) times daily.  . [DISCONTINUED] pregabalin (LYRICA) 100 MG capsule Take 1 capsule (100 mg total) by mouth 3 (three) times daily.   No facility-administered encounter medications on file as of 10/24/2017.     Review of Systems  Constitutional: Positive for fatigue.       Has adjusted his diet.  Has lost weight.    HENT: Negative for congestion and sinus pressure.   Respiratory: Negative for cough, chest tightness and shortness of breath.   Cardiovascular: Negative for chest pain, palpitations and leg swelling.  Gastrointestinal: Negative for abdominal pain, diarrhea, nausea and vomiting.  Genitourinary: Negative for difficulty urinating and dysuria.  Musculoskeletal: Negative for joint swelling and myalgias.  Skin: Negative for color change and rash.  Neurological: Negative for dizziness, light-headedness and headaches.       Trouble picking up objects and tying shoes, etc.   Psychiatric/Behavioral: Negative for agitation and dysphoric mood.       Objective:    Physical Exam  Constitutional: He appears well-developed and well-nourished. No distress.  HENT:  Nose: Nose normal.  Mouth/Throat: Oropharynx is clear and moist.  Neck: Neck supple. No thyromegaly present.  Cardiovascular: Normal rate and regular rhythm.  Pulmonary/Chest: Effort normal and breath sounds normal. No respiratory distress.  Abdominal: Soft. Bowel sounds are normal. There is no tenderness.  Musculoskeletal: He exhibits no edema or tenderness.  Lymphadenopathy:    He has no cervical adenopathy.  Skin: No rash noted. No erythema.  Psychiatric: He has a normal mood and affect. His behavior is normal.    BP (!) 144/88 (BP Location: Left Arm, Patient Position: Sitting, Cuff Size: Large)   Pulse 68    Temp (!) 97.4 F (36.3 C) (Oral)   Resp 16   Wt 271 lb 9.6 oz (123.2 kg)   SpO2 97%   BMI 36.84 kg/m  Wt Readings from Last 3 Encounters:  10/24/17 271 lb 9.6 oz (123.2 kg)  09/25/17 276 lb (125.2 kg)  09/16/17 274 lb (124.3 kg)     Lab Results  Component Value Date   WBC 5.0 09/13/2017   HGB 14.7 09/13/2017   HCT 42.9 09/13/2017   PLT 172 09/13/2017   GLUCOSE 253 (H) 09/13/2017   CHOL 181 09/13/2017   TRIG 231 (H) 09/13/2017   HDL 30 (L) 09/13/2017   LDLCALC 105 (H) 09/13/2017   ALT 15 (L) 09/12/2017   AST 20 09/12/2017   NA 136 09/13/2017   K 4.4 09/13/2017  CL 103 09/13/2017   CREATININE 1.12 09/13/2017   BUN 20 09/13/2017   CO2 26 09/13/2017   TSH 7.970 (H) 03/25/2017   INR 0.96 09/12/2017   HGBA1C 10.8 (H) 09/13/2017    US Carotid Bilateral  Result Date: 09/13/2017 CLINICAL DATA:  Small acute right basal ganglia infarct EXAM: BILATERAL CAROTID DUPLEX ULTRASOUND TECHNIQUE: Pearline Cables scale imaging, color Doppler and duplex ultrasound were performed of bilateral carotid and vertebral arteries in the neck. COMPARISON:  09/12/2017 FINDINGS: Criteria: Quantification of carotid stenosis is based on velocity parameters that correlate the residual internal carotid diameter with NASCET-based stenosis levels, using the diameter of the distal internal carotid lumen as the denominator for stenosis measurement. The following velocity measurements were obtained: RIGHT ICA:  82/28 cm/sec CCA:  60/73 cm/sec SYSTOLIC ICA/CCA RATIO:  0.9 DIASTOLIC ICA/CCA RATIO:  1.6 ECA:  126 cm/sec LEFT ICA:  40/13 cm/sec CCA:  71/0 cm/sec SYSTOLIC ICA/CCA RATIO:  0.5 DIASTOLIC ICA/CCA RATIO:  2.2 ECA:  101 cm/sec RIGHT CAROTID ARTERY: Minor intimal thickening without significant atherosclerotic plaque formation. No hemodynamically significant right ICA stenosis, velocity elevation, or turbulent flow. Degree of narrowing less than 50%. RIGHT VERTEBRAL ARTERY:  Antegrade LEFT CAROTID ARTERY: Minor  atherosclerotic plaque formation. No hemodynamically significant left ICA stenosis, velocity elevation, or turbulent flow. LEFT VERTEBRAL ARTERY:  Antegrade IMPRESSION: Minor intimal thickening and carotid atherosclerosis. No hemodynamically significant ICA stenosis. Degree of narrowing less than 50% bilaterally. Patent antegrade vertebral flow bilaterally Electronically Signed   By: Jerilynn Mages.  Shick M.D.   On: 09/13/2017 12:03       Assessment & Plan:   Problem List Items Addressed This Visit    CVA (cerebral vascular accident) [facility])    Recently admitted with CVA as outlined.  Has improved.  Still having issues with tying his shoes and picking up objects, etc.  Will arrange for OT evaluation and treatment.  Continue aspirin and lipitor.  Continue to get better control of his sugars.  Schedule f/u with neurology.        Relevant Medications   losartan (COZAAR) 50 MG tablet   Other Relevant Orders   Ambulatory referral to Occupational Therapy   Ambulatory referral to Neurology   Diabetes [facility])    Has adjusted his diet.  Has lost weight.  Sugars as outlined.  Continue current medication regimen.  Follow sugars.  Follow met b and a1c.        Relevant Medications   insulin lispro (HUMALOG) 100 UNIT/ML injection   insulin glargine (LANTUS) 100 UNIT/ML injection   losartan (COZAAR) 50 MG tablet   Essential hypertension    Blood pressure remaining elevated.  Will increase losartan to 68m q day.  Follow pressures.  Get him back in soon to reassess.        Relevant Medications   losartan (COZAAR) 50 MG tablet   Hyperlipidemia    On lipitor.  Low cholesterol diet and exercise.  Follow lipid panel and liver function tests.        Relevant Medications   losartan (COZAAR) 50 MG tablet   Hypothyroidism    On thyroid replacement.  Follow tsh.        Relevant Medications   levothyroxine (SYNTHROID) 112 MCG tablet       SEinar Pheasant MD

## 2017-10-24 NOTE — Telephone Encounter (Signed)
Please advise 

## 2017-10-27 ENCOUNTER — Encounter: Payer: Self-pay | Admitting: Internal Medicine

## 2017-10-27 MED ORDER — LOSARTAN POTASSIUM 50 MG PO TABS: 50.0000 mg | ORAL_TABLET | Freq: Every day | ORAL | 1 refills | Status: DC

## 2017-10-27 NOTE — Assessment & Plan Note (Signed)
Blood pressure remaining elevated.  Will increase losartan to 50mg  q day.  Follow pressures.  Get him back in soon to reassess.

## 2017-10-27 NOTE — Assessment & Plan Note (Signed)
Has adjusted his diet.  Has lost weight.  Sugars as outlined.  Continue current medication regimen.  Follow sugars.  Follow met b and a1c.

## 2017-10-27 NOTE — Assessment & Plan Note (Signed)
Recently admitted with CVA as outlined.  Has improved.  Still having issues with tying his shoes and picking up objects, etc.  Will arrange for OT evaluation and treatment.  Continue aspirin and lipitor.  Continue to get better control of his sugars.  Schedule f/u with neurology.

## 2017-10-27 NOTE — Assessment & Plan Note (Signed)
On thyroid replacement.  Follow tsh.  

## 2017-10-27 NOTE — Assessment & Plan Note (Signed)
On lipitor.  Low cholesterol diet and exercise.  Follow lipid panel and liver function tests.   

## 2017-10-29 ENCOUNTER — Telehealth: Payer: Self-pay | Admitting: Internal Medicine

## 2017-10-29 NOTE — Telephone Encounter (Signed)
Spoke with patient. He does not want it filled at [facility] yet. He requested the script be sent to [facility].

## 2017-10-29 NOTE — Telephone Encounter (Signed)
Copied from [place] [id number]. Topic: Quick Communication - See Telephone Encounter >> Oct 29, 2017  9:50 AM [name] wrote: CRM for notification. See Telephone encounter for:  Gregory Crane with [facility] calling needing a new prescription request on lyrica. Looks like on 10/24/17 it was sent to [facility]. It needs to be switched to [facility]. Call back number [phone] ref # [id number].  10/29/17.

## 2017-11-13 ENCOUNTER — Encounter: Payer: Self-pay | Admitting: Occupational Therapy

## 2017-11-13 ENCOUNTER — Other Ambulatory Visit: Payer: Self-pay

## 2017-11-13 ENCOUNTER — Ambulatory Visit: Payer: Managed Care, Other (non HMO) | Attending: Internal Medicine | Admitting: Occupational Therapy

## 2017-11-13 DIAGNOSIS — M6281 Muscle weakness (generalized): Secondary | ICD-10-CM

## 2017-11-13 DIAGNOSIS — R278 Other lack of coordination: Secondary | ICD-10-CM | POA: Insufficient documentation

## 2017-11-13 NOTE — Addendum Note (Signed)
Addended by: Lucia Bitter on: 11/13/2017 04:43 PM   Modules accepted: Orders

## 2017-11-13 NOTE — Therapy (Signed)
[place] MAIN [facility] SERVICES [address] [place], [place], [address] Phone: [phone]   Fax:  [phone]  Occupational Therapy Evaluation  Patient Details  Name: Gregory Crane MRN: [id number] Date of Birth: 10/21/1970 Referring Provider: Dr. [name]   Encounter Date: 11/13/2017  OT End of Session - 11/13/17 1318    Visit Number  1    Number of Visits  24    Date for OT Re-Evaluation  02/05/18    OT Start Time  1030    OT Stop Time  1130    OT Time Calculation (min)  60 min    Activity Tolerance  Patient tolerated treatment well    Behavior During Therapy  [facility] for tasks assessed/performed       Past Medical History:  Diagnosis Date  . Allergy   . Bell's palsy   . Diabetes mellitus without complication (HCC)    diet controlled  . Hypertension   . Hypothyroidism   . Kidney stones   . Stroke [facility])     Past Surgical History:  Procedure Laterality Date  . LUMBAR PUNCTURE     as child  . NO PAST SURGERIES      There were no vitals filed for this visit.  Subjective Assessment - 11/13/17 1038    Subjective   Pt. reports he is back to work, and back driving.    Pertinent History  Pt. is a 47 y.o. male who had a CVA on September 12, 2017. Pt. 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 Pt. PMHx includes: Allergies, Bell's Palsy, DM, DIabetic Neuropathy, HTN, Hypothyroidism, Kidney stones.     Patient Stated Goals  To be able to button his shirt, and tie his shoes.    Currently in Pain?  No/denies        [facility] OT Assessment - 11/13/17 1040      Assessment   Medical Diagnosis  CVA    Referring Provider  Dr. [name]    Onset Date/Surgical Date  09/12/17    Hand Dominance  Right      Precautions   Precautions  None      Restrictions   Weight Bearing Restrictions  No      Balance Screen   Has the patient fallen in the past 6 months  No    Has  the patient had a decrease in activity level because of a fear of falling?   No    Is the patient reluctant to leave their home because of a fear of falling?   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 scored a 74 sum score on the MAM-20 for neurologic conditions.      IADL   Shopping  -- challenging getting card out of wallet, separating bills    Light Housekeeping  Does personal laundry completely    Meal Prep  Plans, prepares and serves adequate meals independently    Medication Management  Is responsible for taking medication in correct dosages at correct time drops pills from hand    Financial Management  Manages financial matters independently (budgets, writes checks, pays rent, bills goes to bank), collects and keeps track of income      Written Expression   Dominant Hand  Right    Handwriting  75% legible      Vision - History   Baseline Vision  Wears glasses all the time    Visual History  Retinopathy    Additional Comments  Pt. is being followed by a retinal specialist, pt. follows up every 6 months.       Activity Tolerance   Activity Tolerance  Tolerates 10-20 min activity with multiple rests      Cognition   Overall Cognitive Status  Within Functional Limits for tasks assessed      Sensation   Light Touch  Appears Intact    Proprioception  Appears Intact      Coordination   Gross Motor Movements are Fluid and Coordinated  Yes    Fine Motor Movements are Fluid and Coordinated  No    Coordination and Movement Description  Pt. drops multiple pegs    Right 9 Hole Peg Test  30    Left 9 Hole Peg Test  32      Hand Function   Right Hand Grip (lbs)  55    Right Hand Lateral Pinch  12 lbs    Right Hand 3 Point Pinch  10 lbs    Left Hand Grip (lbs)  58    Left Hand Lateral Pinch  11 lbs    Left 3 point pinch  6 lbs       OT TREATMENT      Therapeutic Exercise:  Pt. Performed hand strengthening with green theraputty. Pt. required cues for proper technique. Pt. worked on gross grip loop, lateral pinch, 3pt. pinch, gross digit extension, digit extension table spread, digit abduction loop, single digit extension loop, thumb opposition, and lumbical ex,  Pt. required verbal and tactile cues for proper technique. Pt. was provided with a visual handout, with directions.                        OT Long Term Goals - 11/13/17 1340      OT LONG TERM GOAL #1   Title  Pt. will improve FMC to be able to indepndently button his shirt.    Baseline  Eval: Pt. has difficulty manipulating buttons.    Time  12    Period  Weeks    Status  New    Target Date  02/05/18      OT LONG TERM GOAL #2   Title  Pt. will improve bilateral hand coordination  skills to be able to tie his shoes independently, and efficiently 100% of the time.    Baseline  Eval: Pt. has difficulty.    Time  12    Period  Weeks    Status  New    Target Date  02/05/18      OT LONG TERM GOAL #3   Title  Pt. will increase bilateral UE strength by 1 mm grade overall to increase independence with ADLs,  and IADLs.    Baseline  Eval: limted UE strength    Time  12    Period  Weeks    Status  New    Target Date  02/05/18      OT LONG TERM GOAL #4   Title  Pt. will write a 3 sentence paragraph with 100% legibility, efficiently.    Baseline  Eval: name only with 75% legibility    Time  12    Period  Weeks    Status  New    Target Date  02/05/18      OT LONG TERM GOAL #5   Title  Pt. will improve right hand grip strength by 5# to be able to open a bottle.    Baseline  Eval: Pt. has difficulty    Time  12    Period  Weeks    Status  New    Target Date  02/05/18            Plan - 11/13/17 1320    Clinical Impression Statement  Pt. is a  47 y.o. male who presents with BUE weakness, and limited dominant right hand grip strength, pinch strength, and coordination skills which limit his ability to complete ADL tasks including: buttoning, tyying shoes, taking cards, and bills out of his wallet, typing, opening bottles, as well as writing legibly, and efficiently. Pt. will benefit from OT skilled services to work on improving BUE strength, and right hand function skills, [place], speed, and dexterity for improved ADL, and IADL functioning.    Occupational performance deficits (Please refer to evaluation for details):  ADL's;IADL's    Current Impairments/barriers affecting progress:  Positive indicators: age, family support, and motivation. Negative indicators: multiple co-morbidities.    OT Frequency  1x / week    OT Duration  12 weeks    OT Treatment/Interventions  Self-care/ADL training;DME and/or AE instruction;Patient/family education;Therapeutic exercise;Therapeutic activities;Neuromuscular education    Clinical Decision Making  Several treatment options, min-mod task modification necessary    Consulted and Agree with Plan of Care  Patient       Patient will benefit from skilled therapeutic intervention in order to improve the following deficits and impairments:  Impaired  vision/preception, Decreased strength, Impaired UE functional use, Decreased balance, Decreased activity tolerance  Visit Diagnosis: Muscle weakness (generalized)  Other lack of coordination    Problem List Patient Active Problem List   Diagnosis Date Noted  . CVA (cerebral vascular accident) ([place]) 09/12/2017  . Cough 01/20/2017  . Bell's palsy 11/10/2016  . History of CVA (cerebrovascular accident) 11/10/2016  . 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 History of nephrolithiasis 10/27/2016  . TIA (transient ischemic attack) 10/20/2016  . Near syncope 06/23/2016  . 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 Neuropathy 08/06/2015  . Health care maintenance 08/06/2015  . Essential hypertension 08/06/2015  . Heme positive stool 10/10/2013  . Hypothyroidism 10/10/2013  . Microalbuminuria 10/10/2013  . Hyperlipidemia 10/10/2013  . B12 deficiency 10/10/2013  . Diabetes ([place]) 07/04/2013  .  Environmental allergies 07/04/2013    [name], MS, OTR/L 11/13/2017, 4:38 PM  [place] MAIN [facility] SERVICES [address] [place], [place], [address] Phone: [phone]   Fax:  [phone]  Name: Gregory Crane MRN: [id number] Date of Birth: Jan 10, 1971

## 2017-11-17 ENCOUNTER — Other Ambulatory Visit: Payer: Self-pay

## 2017-11-17 ENCOUNTER — Ambulatory Visit: Payer: Managed Care, Other (non HMO) | Admitting: Occupational Therapy

## 2017-11-17 MED ORDER — METFORMIN HCL 500 MG PO TABS: 500.0000 mg | ORAL_TABLET | Freq: Every day | ORAL | 1 refills | Status: DC

## 2017-11-19 ENCOUNTER — Encounter: Payer: Managed Care, Other (non HMO) | Admitting: Occupational Therapy

## 2017-11-20 ENCOUNTER — Ambulatory Visit: Payer: 59 | Admitting: Internal Medicine

## 2017-11-20 ENCOUNTER — Encounter: Payer: Self-pay | Admitting: Occupational Therapy

## 2017-11-20 ENCOUNTER — Ambulatory Visit: Payer: Managed Care, Other (non HMO) | Admitting: Occupational Therapy

## 2017-11-20 DIAGNOSIS — R278 Other lack of coordination: Secondary | ICD-10-CM

## 2017-11-20 DIAGNOSIS — M6281 Muscle weakness (generalized): Secondary | ICD-10-CM | POA: Diagnosis not present

## 2017-11-20 NOTE — Therapy (Signed)
[place] MAIN [facility] SERVICES [address] [place], [place], [address] Phone: [phone]   Fax:  [phone]  Occupational Therapy Treatment  Patient Details  Name: Gregory Crane MRN: [id number] Date of Birth: [birth year] Referring Provider: Dr. [name]   Encounter Date: 11/20/2017  OT End of Session - 11/20/17 1007    Visit Number  2    Number of Visits  24    Date for OT Re-Evaluation  02/05/18    OT Start Time  0945    OT Stop Time  1030    OT Time Calculation (min)  45 min    Activity Tolerance  Patient tolerated treatment well    Behavior During Therapy  [facility] for tasks assessed/performed       Past Medical History:  Diagnosis Date  . Allergy   . Bell's palsy   . Diabetes mellitus without complication (HCC)    diet controlled  . Hypertension   . Hypothyroidism   . Kidney stones   . Stroke [facility])     Past Surgical History:  Procedure Laterality Date  . LUMBAR PUNCTURE     as child  . NO PAST SURGERIES      There were no vitals filed for this visit.  Subjective Assessment - 11/20/17 1006    Subjective   Pt. reports being back at work has been okay.    Pertinent History  Pt. is a 47 y.o. male who had a CVA on September 12, 2017. Pt. 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 Pt. PMHx includes: Allergies, Bell's Palsy, DM, DIabetic Neuropathy, HTN, Hypothyroidism, Kidney stones.     Patient Stated Goals  To be able to button his shirt, and tie his shoes.    Currently in Pain?  No/denies      OT TREATMENT    Neuro muscular re-education:  Pt. Worked on grasping, and  storing the in his right hand, and translatory movements of the hand moving the object from his palm to the tip of his thumb, and 2nd digit. Pt. Worked with 1/2" circular objects, 1" grooved pegs, and 1" straight [place] sticks. Pt. Requires verbal cues to slow down, and focus on the quality of  the movement rather than speed.   Therapeutic Exercise:  Pt. performed gross gripping with grip strengthener. Pt. worked on sustaining grip while grasping pegs and reaching at various heights. Gripper was placed in the 3rd resistive slot with the white resistive spring. Pt. Worked on pinch strengthening in the right , and left  hands for lateral, and 3pt. pinch using yellow, red, green, blue, and black resistive clips. Pt. worked on placing the clips at various vertical and horizontal angles. Tactile and verbal cues were required for eliciting the desired movement.                            OT Long Term Goals - 11/13/17 1340      OT LONG TERM GOAL #1   Title  Pt. will improve FMC to be able to indepndently button his shirt.    Baseline  Eval: Pt. has difficulty manipulating buttons.    Time  12    Period  Weeks    Status  New    Target Date  02/05/18      OT LONG TERM GOAL #2   Title  Pt. will improve bilateral hand  coordination skills to be able to tie his shoes independently, and efficiently 100% of the time.    Baseline  Eval: Pt. has difficulty.    Time  12    Period  Weeks    Status  New    Target Date  02/05/18      OT LONG TERM GOAL #3   Title  Pt. will increase bilateral UE strength by 1 mm grade overall to increase independence with ADLs, and IADLs.    Baseline  Eval: limted UE strength    Time  12    Period  Weeks    Status  New    Target Date  02/05/18      OT LONG TERM GOAL #4   Title  Pt. will write a 3 sentence paragraph with 100% legibility, efficiently.    Baseline  Eval: name only with 75% legibility    Time  12    Period  Weeks    Status  New    Target Date  02/05/18      OT LONG TERM GOAL #5   Title  Pt. will improve right hand grip strength by 5# to be able to open a bottle.    Baseline  Eval: Pt. has difficulty    Time  12    Period  Weeks    Status  New    Target Date  02/05/18            Plan - 11/20/17 1007     Clinical Impression Statement Pt. reports he has noticed some improvements with his ability to tie his shoes.  Pt. is working with the theraputty at home following the HEP. Pt. continues to present with limited [facility] skills, and translatory movements of the hand moving objects through his hand. Pt. continues to work on improving [facility] skills to improved functional hand use during ADLs, and IADLs.    Occupational performance deficits (Please refer to evaluation for details):  ADL's;IADL's    Current Impairments/barriers affecting progress:  Positive indicators: age, family support, and motivation. Negative indicators: multiple co-morbidities.    OT Frequency  1x / week    OT Duration  12 weeks    OT Treatment/Interventions  Self-care/ADL training;DME and/or AE instruction;Patient/family education;Therapeutic exercise;Therapeutic activities;Neuromuscular education    Clinical Decision Making  Several treatment options, min-mod task modification necessary    Consulted and Agree with Plan of Care  Patient       Patient will benefit from skilled therapeutic intervention in order to improve the following deficits and impairments:  Impaired vision/preception, Decreased strength, Impaired UE functional use, Decreased balance, Decreased activity tolerance  Visit Diagnosis: Muscle weakness (generalized)  Other lack of coordination    Problem List Patient Active Problem List   Diagnosis Date Noted  . CVA (cerebral vascular accident) ([place]) 09/12/2017  . Cough 01/20/2017  . Bell's palsy 11/10/2016  . History of CVA (cerebrovascular accident) 11/10/2016  . 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 History of nephrolithiasis 10/27/2016  . TIA (transient ischemic attack) 10/20/2016  . Near syncope 06/23/2016  . 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 Neuropathy 08/06/2015  . Health care maintenance 08/06/2015  . Essential hypertension 08/06/2015  . Heme positive stool  10/10/2013  . Hypothyroidism 10/10/2013  . Microalbuminuria 10/10/2013  . Hyperlipidemia 10/10/2013  . B12 deficiency 10/10/2013  . Diabetes ([place]) 07/04/2013  . Environmental allergies 07/04/2013    [name], MS, OTR/L 11/20/2017, 10:23 AM  [place], [place], [address] Phone: [phone]   Fax:  [phone]  Name: [place] Mullett MRN: [id number] Date of Birth: March 24, 1971

## 2017-11-22 ENCOUNTER — Encounter: Payer: Self-pay | Admitting: Internal Medicine

## 2017-11-24 ENCOUNTER — Other Ambulatory Visit: Payer: Self-pay | Admitting: Internal Medicine

## 2017-11-28 ENCOUNTER — Other Ambulatory Visit: Payer: Self-pay | Admitting: Internal Medicine

## 2017-11-28 NOTE — Telephone Encounter (Signed)
Requesting a 90 day supply.  Last OV:10/24/17 PCP: [place]: [facility] [place], [place] - [place] [phone] (Phone) [phone] (Fax)

## 2017-11-28 NOTE — Telephone Encounter (Signed)
Copied from [place]. Topic: Quick Communication - Rx Refill/Question >> Nov 28, 2017  3:43 PM Synthia Innocent wrote: Medication: Insulin Syringe-Needle U-100 27G X 1/2" 1 ML MISC 90 day supply Has the patient contacted their pharmacy? Yes.   (Agent: If no, request that the patient contact the pharmacy for the refill.) Preferred Pharmacy (with phone number or street name): [facility] on [address]: Please be advised that RX refills may take up to 3 business days. We ask that you follow-up with your pharmacy.

## 2017-12-01 MED ORDER — "INSULIN SYRINGE-NEEDLE U-100 27G X 1/2"" 1 ML MISC": 2 refills | Status: DC

## 2017-12-02 ENCOUNTER — Ambulatory Visit: Payer: Managed Care, Other (non HMO) | Admitting: Occupational Therapy

## 2017-12-04 ENCOUNTER — Encounter: Payer: Managed Care, Other (non HMO) | Admitting: Occupational Therapy

## 2017-12-04 ENCOUNTER — Encounter: Payer: Self-pay | Admitting: Occupational Therapy

## 2017-12-04 ENCOUNTER — Ambulatory Visit: Payer: Managed Care, Other (non HMO) | Attending: Internal Medicine | Admitting: Occupational Therapy

## 2017-12-04 DIAGNOSIS — M6281 Muscle weakness (generalized): Secondary | ICD-10-CM

## 2017-12-04 DIAGNOSIS — R278 Other lack of coordination: Secondary | ICD-10-CM

## 2017-12-04 NOTE — Therapy (Signed)
[place] MAIN [facility] SERVICES [address] [place], [place], [address] Phone: [phone]   Fax:  [phone]  Occupational Therapy Treatment  Patient Details  Name: Gregory Crane MRN: [id number] Date of Birth: 1971-02-17 Referring Provider: Dr. [name]   Encounter Date: 12/04/2017  OT End of Session - 12/04/17 1107    Visit Number  3    Number of Visits  24    Date for OT Re-Evaluation  02/05/18    OT Start Time  1102    OT Stop Time  1145    OT Time Calculation (min)  43 min    Activity Tolerance  Patient tolerated treatment well    Behavior During Therapy  [facility] for tasks assessed/performed       Past Medical History:  Diagnosis Date  . Allergy   . Bell's palsy   . Diabetes mellitus without complication (HCC)    diet controlled  . Hypertension   . Hypothyroidism   . Kidney stones   . Stroke [facility])     Past Surgical History:  Procedure Laterality Date  . LUMBAR PUNCTURE     as child  . NO PAST SURGERIES      There were no vitals filed for this visit.   OT TREATMENT    Neuro muscular re-education:  Pt. performed [facility] tasks using the Grooved pegboard. Pt. worked on grasping the grooved pegs from a horizontal position, and moving the pegs to a vertical position in the hand to prepare for placing them in the grooved slot. Pt. Worked on grasping, and storing objects in the ulnar aspect of his hand. Pt. Has difficulty with moving objects through his hand, and holding onto objects with his 4th, and 5th digits. Pt. Worked on handling, and using long nosed tweezers.     Therapeutic Exercise:  Pt. performed gross gripping with grip strengthener. Pt. worked on sustaining grip while grasping pegs and reaching at various heights. Gripper was placed in the 3rd resistive slot with the white resistive spring. Pt. Worked on the Digiflex 1.5#. Pt. Worked on alternating digits, as well as 2 digits  together.                               OT Long Term Goals - 11/13/17 1340      OT LONG TERM GOAL #1   Title  Pt. will improve FMC to be able to indepndently button his shirt.    Baseline  Eval: Pt. has difficulty manipulating buttons.    Time  12    Period  Weeks    Status  New    Target Date  02/05/18      OT LONG TERM GOAL #2   Title  Pt. will improve bilateral hand coordination skills to be able to tie his shoes independently, and efficiently 100% of the time.    Baseline  Eval: Pt. has difficulty.    Time  12    Period  Weeks    Status  New    Target Date  02/05/18      OT LONG TERM GOAL #3   Title  Pt. will increase bilateral UE strength by 1 mm grade overall to increase independence with ADLs, and IADLs.    Baseline  Eval: limted UE strength    Time  12    Period  Weeks    Status  New    Target  Date  02/05/18      OT LONG TERM GOAL #4   Title  Pt. will write a 3 sentence paragraph with 100% legibility, efficiently.    Baseline  Eval: name only with 75% legibility    Time  12    Period  Weeks    Status  New    Target Date  02/05/18      OT LONG TERM GOAL #5   Title  Pt. will improve right hand grip strength by 5# to be able to open a bottle.    Baseline  Eval: Pt. has difficulty    Time  12    Period  Weeks    Status  New    Target Date  02/05/18            Plan - 12/04/17 1107    Clinical Impression Statement  Pt. continues to present with limitations with [facility], and translatory translatory movements of the right hand. Pt. conitnues to work on improving RUE functioning, [place], translatory movements of the hand, and using ulnar aspect of his right hand for storage, while grasping and manipulating objects with his 2nd digit, and thumb. Pt. is still having difficulty with tying his shoes.    Occupational performance deficits (Please refer to evaluation for details):  ADL's;IADL's    Current Impairments/barriers affecting progress:   Positive indicators: age, family support, and motivation. Negative indicators: multiple co-morbidities.    OT Frequency  1x / week    OT Duration  12 weeks    OT Treatment/Interventions  Self-care/ADL training;DME and/or AE instruction;Patient/family education;Therapeutic exercise;Therapeutic activities;Neuromuscular education    Clinical Decision Making  Several treatment options, min-mod task modification necessary    Consulted and Agree with Plan of Care  Patient       Patient will benefit from skilled therapeutic intervention in order to improve the following deficits and impairments:  Impaired vision/preception, Decreased strength, Impaired UE functional use, Decreased balance, Decreased activity tolerance  Visit Diagnosis: Muscle weakness (generalized)  Other lack of coordination    Problem List Patient Active Problem List   Diagnosis Date Noted  . CVA (cerebral vascular accident) ([place]) 09/12/2017  . Cough 01/20/2017  . Bell's palsy 11/10/2016  . History of CVA (cerebrovascular accident) 11/10/2016  . 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 History of nephrolithiasis 10/27/2016  . TIA (transient ischemic attack) 10/20/2016  . Near syncope 06/23/2016  . 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 Neuropathy 08/06/2015  . Health care maintenance 08/06/2015  . Essential hypertension 08/06/2015  . Heme positive stool 10/10/2013  . Hypothyroidism 10/10/2013  . Microalbuminuria 10/10/2013  . Hyperlipidemia 10/10/2013  . B12 deficiency 10/10/2013  . Diabetes ([place]) 07/04/2013  . Environmental allergies 07/04/2013    [name], MS, OTR/L 12/04/2017, 11:43 AM  [place] MAIN [facility] SERVICES [address] [place], [place], [address] Phone: [phone]   Fax:  [phone]  Name: Gregory Crane MRN: [id number] Date of Birth: Mar 20, 1971

## 2017-12-08 ENCOUNTER — Ambulatory Visit: Payer: Managed Care, Other (non HMO) | Admitting: Occupational Therapy

## 2017-12-11 ENCOUNTER — Ambulatory Visit: Payer: Managed Care, Other (non HMO) | Admitting: Occupational Therapy

## 2017-12-11 ENCOUNTER — Encounter: Payer: Managed Care, Other (non HMO) | Admitting: Occupational Therapy

## 2017-12-11 DIAGNOSIS — M6281 Muscle weakness (generalized): Secondary | ICD-10-CM

## 2017-12-11 DIAGNOSIS — R278 Other lack of coordination: Secondary | ICD-10-CM

## 2017-12-11 NOTE — Therapy (Signed)
[place] MAIN [facility] SERVICES [address] [place], [place], [address] Phone: [phone]   Fax:  [phone]  Occupational Therapy Treatment  Patient Details  Name: Gregory Crane MRN: [id number] Date of Birth: 19-Jul-1971 Referring Provider: Dr. [name]   Encounter Date: 12/11/2017  OT End of Session - 12/11/17 1103    Visit Number  4    Number of Visits  24    Date for OT Re-Evaluation  02/05/18    Authorization Type  visit 4of 10 for progress report period starting 11/13/2017    OT Start Time  1100    OT Stop Time  1145    OT Time Calculation (min)  45 min    Activity Tolerance  Patient tolerated treatment well    Behavior During Therapy  [facility] for tasks assessed/performed       Past Medical History:  Diagnosis Date  . Allergy   . Bell's palsy   . Diabetes mellitus without complication (HCC)    diet controlled  . Hypertension   . Hypothyroidism   . Kidney stones   . Stroke [facility])     Past Surgical History:  Procedure Laterality Date  . LUMBAR PUNCTURE     as child  . NO PAST SURGERIES      There were no vitals filed for this visit.  OT TREATMENT    Neuro muscular re-education:  Pt. worked on [facility] skills grasping 1/16th" beads and placing them on an extra small dowel with his right hand. Pt. dropped several beads initially, however it improved with reps. Pt. worked on untying knots with his right hand. Pt. Worked on Media planner. Pt. Wrote 3 sentences in 3 min. With 90% legibility while halding, and maintaining a mature grasp on a standard pen.  Therapeutic Exercise:  Pt. worked on Retail banker using Puttycize  tools with green theraband using the knob turn, cap turn, peg turn, L-Bar and key turn attachments to target specific components of movements in the hand. Pt. Worked on using the key turn attachment for standard turning, lateral pinch turn, intrinsic turning, lateral pinch pull. Pt. Worked on using  the cap turn attachment for standard turning, and thumb opposition. Pt. worked on using the peg turn attachment to for standard turning using the digits, and thumb together.                                OT Long Term Goals - 11/13/17 1340      OT LONG TERM GOAL #1   Title  Pt. will improve FMC to be able to indepndently button his shirt.    Baseline  Eval: Pt. has difficulty manipulating buttons.    Time  12    Period  Weeks    Status  New    Target Date  02/05/18      OT LONG TERM GOAL #2   Title  Pt. will improve bilateral hand coordination skills to be able to tie his shoes independently, and efficiently 100% of the time.    Baseline  Eval: Pt. has difficulty.    Time  12    Period  Weeks    Status  New    Target Date  02/05/18      OT LONG TERM GOAL #3   Title  Pt. will increase bilateral UE strength by 1 mm grade overall to increase independence with ADLs, and IADLs.  Baseline  Eval: limted UE strength    Time  12    Period  Weeks    Status  New    Target Date  02/05/18      OT LONG TERM GOAL #4   Title  Pt. will write a 3 sentence paragraph with 100% legibility, efficiently.    Baseline  Eval: name only with 75% legibility    Time  12    Period  Weeks    Status  New    Target Date  02/05/18      OT LONG TERM GOAL #5   Title  Pt. will improve right hand grip strength by 5# to be able to open a bottle.    Baseline  Eval: Pt. has difficulty    Time  12    Period  Weeks    Status  New    Target Date  02/05/18            Plan - 12/11/17 1103    Occupational performance deficits (Please refer to evaluation for details):  ADL's;IADL's    Current Impairments/barriers affecting progress:  Positive indicators: age, family support, and motivation. Negative indicators: multiple co-morbidities.    OT Frequency  1x / week    OT Duration  12 weeks    OT Treatment/Interventions  Self-care/ADL training;DME and/or AE  instruction;Patient/family education;Therapeutic exercise;Therapeutic activities;Neuromuscular education    Clinical Decision Making  Several treatment options, min-mod task modification necessary    Consulted and Agree with Plan of Care  Patient       Patient will benefit from skilled therapeutic intervention in order to improve the following deficits and impairments:  Impaired vision/preception, Decreased strength, Impaired UE functional use, Decreased balance, Decreased activity tolerance  Visit Diagnosis: No diagnosis found.    Problem List Patient Active Problem List   Diagnosis Date Noted  . CVA (cerebral vascular accident) ([place]) 09/12/2017  . Cough 01/20/2017  . Bell's palsy 11/10/2016  . History of CVA (cerebrovascular accident) 11/10/2016  . 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 History of nephrolithiasis 10/27/2016  . TIA (transient ischemic attack) 10/20/2016  . Near syncope 06/23/2016  . 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 Neuropathy 08/06/2015  . Health care maintenance 08/06/2015  . Essential hypertension 08/06/2015  . Heme positive stool 10/10/2013  . Hypothyroidism 10/10/2013  . Microalbuminuria 10/10/2013  . Hyperlipidemia 10/10/2013  . B12 deficiency 10/10/2013  . Diabetes ([place]) 07/04/2013  . Environmental allergies 07/04/2013    [name], MS, OTR/L 12/11/2017, 11:28 AM  [place] [address] [place], [place], [address] Phone: [phone]   Fax:  [phone]  Name: Gregory Crane MRN: [id number] Date of Birth: Apr 26, 1971

## 2017-12-15 ENCOUNTER — Telehealth: Payer: Self-pay | Admitting: Internal Medicine

## 2017-12-15 NOTE — Telephone Encounter (Signed)
Copied from [place] [id number]. Topic: Quick Communication - Rx Refill/Question >> Dec 15, 2017  3:26 PM Percell Belt A wrote: Medication:  insulin lispro (HUMALOG) 100 UNIT/ML injection [[id number]]  Has the patient contacted their pharmacy? No (Agent: If no, request that the patient contact the pharmacy for the refill.) Preferred Pharmacy (with phone number or street name): [facility] on [address]: Please be advised that RX refills may take up to 3 business days. We ask that you follow-up with your pharmacy.

## 2017-12-16 ENCOUNTER — Encounter: Payer: Managed Care, Other (non HMO) | Admitting: Occupational Therapy

## 2017-12-16 MED ORDER — INSULIN LISPRO 100 UNIT/ML ~~LOC~~ SOLN: 4.0000 [IU] | Freq: Three times a day (TID) | SUBCUTANEOUS | 2 refills | Status: DC

## 2017-12-16 NOTE — Telephone Encounter (Signed)
Pt called stating he has one day supply of Humalog left and cannot get it filled till April 29 at  [place] [facility] on [address]   Due to  [employer]

## 2017-12-16 NOTE — Telephone Encounter (Signed)
Called patient and he stated that most of the time he only has to use 4 units but that PCP knows depending on his CBG's he may adjust to 6 units, this has caused patient to run out of medication early so now he needs new script reading he may adjust in order for insurance to cover medication.

## 2017-12-16 NOTE — Addendum Note (Signed)
Addended by: Kerin Salen R on: 12/16/2017 11:15 AM   Modules accepted: Orders

## 2017-12-16 NOTE — Telephone Encounter (Signed)
Ok to change rx to 4-6 units.

## 2017-12-16 NOTE — Telephone Encounter (Signed)
New script reading 4-6 units three times daily [facility] patient notified.

## 2017-12-16 NOTE — Telephone Encounter (Signed)
Attempted to call patient and clarify if he has Humalog 100 unit/ml left. He has refills at the [facility]. Left message for him to call the office back.

## 2017-12-18 ENCOUNTER — Ambulatory Visit: Payer: Managed Care, Other (non HMO) | Admitting: Occupational Therapy

## 2017-12-18 ENCOUNTER — Encounter: Payer: Managed Care, Other (non HMO) | Admitting: Occupational Therapy

## 2017-12-18 ENCOUNTER — Encounter: Payer: Self-pay | Admitting: Occupational Therapy

## 2017-12-18 DIAGNOSIS — M6281 Muscle weakness (generalized): Secondary | ICD-10-CM | POA: Diagnosis not present

## 2017-12-18 DIAGNOSIS — R278 Other lack of coordination: Secondary | ICD-10-CM

## 2017-12-18 NOTE — Therapy (Addendum)
[place] MAIN [facility] SERVICES [place], [place], [address] Phone: [phone]   Fax:  [phone]  Occupational Therapy Treatment/Discharge Note  Patient Details  Name: Gregory Crane MRN: [id number] Date of Birth: [birth year] Referring Provider: Dr. [name]   Encounter Date: 12/18/2017  OT End of Session - 12/18/17 1220    Visit Number  5    Number of Visits  24    Date for OT Re-Evaluation  02/05/18    Authorization Type  visit 5 of 10 for progress report period starting 11/13/2017    OT Start Time  1145    OT Stop Time  1210    OT Time Calculation (min)  25 min    Activity Tolerance  Patient tolerated treatment well    Behavior During Therapy  [facility] for tasks assessed/performed       Past Medical History:  Diagnosis Date  . Allergy   . Bell's palsy   . Diabetes mellitus without complication (HCC)    diet controlled  . Hypertension   . Hypothyroidism   . Kidney stones   . Stroke [facility])     Past Surgical History:  Procedure Laterality Date  . LUMBAR PUNCTURE     as child  . NO PAST SURGERIES      There were no vitals filed for this visit.  Subjective Assessment - 12/18/17 1144    Subjective   Pt. reports no changes.    Pertinent History  Pt. is a 47 y.o. male who had a CVA on September 12, 2017. Pt. 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 Pt. PMHx includes: Allergies, Bell's Palsy, DM, DIabetic Neuropathy, HTN, Hypothyroidism, Kidney stones.     Patient Stated Goals  To be able to button his shirt, and tie his shoes.    Currently in Pain?  No/denies         [facility] OT Assessment - 12/18/17 0001      Coordination   Right 9 Hole Peg Test  25    Left 9 Hole Peg Test  30      Hand Function   Right Hand Grip (lbs)  55    Right Hand Lateral Pinch  13 lbs    Right Hand 3 Point Pinch  11 lbs    Left Hand Grip (lbs)  58    Left Hand Lateral  Pinch  14 lbs    Left 3 point pinch  9 lbs      Measurements were obtained, and goals were reviewed with pt.    OT TREATMENT    Therapeutic Exercise:  Pt. Education was provided about UE strength, grip strength, and pinch strength. Pt. demonstrated proper use of green theraband exercises for shoulder flexion, horizontal abduction, elbo flexion, and extension without difficulty. Pt. Was provided with green theraband for home use.                           OT Long Term Goals - 12/18/17 1155      OT LONG TERM GOAL #1   Title  Pt. will improve FMC to be able to indepndently button his shirt.    Baseline  Eval: Pt. has difficulty manipulating buttons. 12/18/2017: Pt. has improved, and and is able to manipulate buttons independently.    Time  12    Period  Weeks    Status  Achieved  OT LONG TERM GOAL #2   Title  Pt. will improve bilateral hand coordination skills to be able to tie his shoes independently, and efficiently 100% of the time.    Baseline  Eval: Pt. has difficulty. 4/24/ 2019: Pt. is able to complete independently, however it takes increased time.    Time  12    Period  Weeks    Status  Achieved      OT LONG TERM GOAL #3   Title  Pt. will increase bilateral UE strength by 1 mm grade overall to increase independence with ADLs, and IADLs.    Baseline  Eval: limted UE strength 12/18/2017: BUE strength 5/5 overall    Time  12    Period  Weeks    Status  Achieved      OT LONG TERM GOAL #4   Title  Pt. will write a 3 sentence paragraph with 100% legibility, efficiently.    Baseline  Eval: name only with 75% legibility 10/19/2017: Goal met    Time  12    Period  Weeks    Status  Achieved      OT LONG TERM GOAL #5   Title  Pt. will improve right hand grip strength by 5# to be able to open a bottle.    Baseline  Eval: Pt. has difficulty 12/17/2017: Grip strength remained the same secondary to trigger finger.    Time  12    Period  Weeks    Status   Not Met      Long Term Additional Goals   Additional Long Term Goals  Yes            Plan - 12/18/17 1220    Clinical Impression Statement  Pt. has made excellent progress with lateral pinch strength, 3pt. pinch strength, FMC speed, and dexterity. Pt. grip strength continues to be the same, which pt. attributes to his right 2nd digit trigger finger needing to be injected soon. Pt. has made progress with buttoning, tying shoes, and writing. Pt. reports independence with tying shoes, however it takes increased time to complete. Pt. has numerous Scraper tasks he is working on at home, and at work.  Pt. has met most goals, and is now ready for discharge.     Occupational performance deficits (Please refer to evaluation for details):  ADL's;IADL's    Current Impairments/barriers affecting progress:  Positive indicators: age, family support, and motivation. Negative indicators: multiple co-morbidities.    OT Frequency  1x / week    OT Duration  12 weeks    OT Treatment/Interventions  Self-care/ADL training;DME and/or AE instruction;Patient/family education;Therapeutic exercise;Therapeutic activities;Neuromuscular education    Clinical Decision Making  Several treatment options, min-mod task modification necessary    Consulted and Agree with Plan of Care  Patient       Patient will benefit from skilled therapeutic intervention in order to improve the following deficits and impairments:  Impaired vision/preception, Decreased strength, Impaired UE functional use, Decreased balance, Decreased activity tolerance  Visit Diagnosis: Muscle weakness (generalized)  Other lack of coordination    Problem List Patient Active Problem List   Diagnosis Date Noted  . CVA (cerebral vascular accident) ([place]) 09/12/2017  . Cough 01/20/2017  . Bell's palsy 11/10/2016  . History of CVA (cerebrovascular accident) 11/10/2016  . 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  History of nephrolithiasis 10/27/2016  . TIA (transient ischemic attack) 10/20/2016  . Near syncope 06/23/2016  . 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  Neuropathy 08/06/2015  . Health care maintenance 08/06/2015  . Essential hypertension 08/06/2015  . Heme positive stool 10/10/2013  . Hypothyroidism 10/10/2013  . Microalbuminuria 10/10/2013  . Hyperlipidemia 10/10/2013  . B12 deficiency 10/10/2013  . Diabetes ([place]) 07/04/2013  . Environmental allergies 07/04/2013    [name], MS, OTR/L 12/18/2017, 12:27 PM  [place] MAIN [facility] SERVICES [address] [place], [place], [address] Phone: [phone]   Fax:  [phone]  Name: Gregory Crane MRN: [id number] Date of Birth: [birth year]

## 2017-12-22 ENCOUNTER — Other Ambulatory Visit: Payer: Self-pay | Admitting: Internal Medicine

## 2017-12-25 ENCOUNTER — Other Ambulatory Visit: Payer: Self-pay | Admitting: Internal Medicine

## 2017-12-26 ENCOUNTER — Telehealth: Payer: Self-pay | Admitting: Internal Medicine

## 2017-12-26 NOTE — Telephone Encounter (Signed)
Patient used last refill 5/19 this prescription is for next month last OV 4/19

## 2017-12-27 ENCOUNTER — Telehealth: Payer: Self-pay | Admitting: Family Medicine

## 2017-12-27 MED ORDER — PREGABALIN 100 MG PO CAPS: 100.0000 mg | ORAL_CAPSULE | Freq: Three times a day (TID) | ORAL | 0 refills | Status: DC

## 2017-12-27 NOTE — Telephone Encounter (Signed)
Call from nurse line requesting refill for Lyrica.  Patient will run out today.  I called in a refill of Lyrica 100 mg twice daily #30 with no refills to the pharmacy on file.  He is instructed to call his PCP's office on Monday for further refills.

## 2017-12-29 ENCOUNTER — Telehealth: Payer: Self-pay | Admitting: Internal Medicine

## 2017-12-29 NOTE — Telephone Encounter (Signed)
Spouse checking status of refill, requesting 90 day supply. Call back [phone]

## 2017-12-29 NOTE — Telephone Encounter (Signed)
Left voicemail for patients wife ( on [place]) that Lyrica was called into pharmacy and to call the office back if she has any further questions or concerns.

## 2017-12-29 NOTE — Telephone Encounter (Signed)
Copied from [place] [id number]. Topic: Quick Communication - See Telephone Encounter >> Dec 29, 2017  4:06 PM [name] wrote: CRM for notification. See Telephone encounter for: 12/29/17.  Pt is needing pregabalin (LYRICA) 100 MG capsule refilled. They have been calling since last week trying to get the refill. Dr. Deborra Medina prescribed 10 days worth which is $200+ dollars. The pharmacy gave him 4 pills to get through today. They are really needing this [facility]. Has to be 90 days worth [facility] days

## 2017-12-29 NOTE — Telephone Encounter (Signed)
fyi

## 2017-12-29 NOTE — Telephone Encounter (Signed)
Left voicemail for patient informing him that Dr. Nicki Reaper has sent refill to [facility] in [place] but CRM say [facility].Calling to get clarification and for patient to return call to office.

## 2017-12-30 ENCOUNTER — Telehealth: Payer: Self-pay | Admitting: Internal Medicine

## 2017-12-30 NOTE — Telephone Encounter (Signed)
[facility] stated tht [facility] had [facility] him to tell him he has a refill ready for pick up

## 2017-12-30 NOTE — Telephone Encounter (Signed)
Copied from [place] [phone] [phone]. Topic: Quick Communication - See Telephone Encounter >> Dec 29, 2017  4:06 PM [name] wrote: CRM for notification. See Telephone encounter for: 12/29/17.  Pt is needing pregabalin (LYRICA) 100 MG capsule refilled. They have been calling since last week trying to get the refill. Dr. Deborra Medina prescribed 10 days worth which is $200+ dollars. The pharmacy gave him 4 pills to get through today. They are really needing this [facility]. Has to be 90 days worth [facility] only [facility] 90 days  >> Dec 29, 2017  5:11 PM Percell Belt A wrote: Pt called back and state all script are going to [place].  He is no longer using mail order.I updated pharmacy  >> Dec 30, 2017 11:27 AM Cleaster Corin, NT wrote: Forbes Cellar calling from [facility] to let Dr. Nicki Reaper know that pt. Has called and is requesting 90 day supply of LYRICA 100 MG capsule [[id number]] 90 day supply of (3 boxes of 100)  INSULIN SYRINGE .5CC/29G 29G X 1/2" 0.5 ML MISC [[id number]]

## 2017-12-30 NOTE — Telephone Encounter (Signed)
Dr. Nicki Reaper refilled these yesterday but pharmacy did not receive them. I called them in today both for 90 day supply.

## 2018-01-05 ENCOUNTER — Other Ambulatory Visit: Payer: Self-pay

## 2018-01-05 ENCOUNTER — Encounter: Payer: Self-pay | Admitting: Emergency Medicine

## 2018-01-05 ENCOUNTER — Emergency Department: Payer: Managed Care, Other (non HMO)

## 2018-01-05 DIAGNOSIS — G510 Bell's palsy: Secondary | ICD-10-CM | POA: Diagnosis present

## 2018-01-05 DIAGNOSIS — Z794 Long term (current) use of insulin: Secondary | ICD-10-CM

## 2018-01-05 DIAGNOSIS — I6389 Other cerebral infarction: Secondary | ICD-10-CM | POA: Diagnosis not present

## 2018-01-05 DIAGNOSIS — Z87442 Personal history of urinary calculi: Secondary | ICD-10-CM

## 2018-01-05 DIAGNOSIS — Z833 Family history of diabetes mellitus: Secondary | ICD-10-CM

## 2018-01-05 DIAGNOSIS — E1140 Type 2 diabetes mellitus with diabetic neuropathy, unspecified: Secondary | ICD-10-CM | POA: Diagnosis present

## 2018-01-05 DIAGNOSIS — E785 Hyperlipidemia, unspecified: Secondary | ICD-10-CM | POA: Diagnosis present

## 2018-01-05 DIAGNOSIS — Z79899 Other long term (current) drug therapy: Secondary | ICD-10-CM

## 2018-01-05 DIAGNOSIS — R402252 Coma scale, best verbal response, oriented, at arrival to emergency department: Secondary | ICD-10-CM | POA: Diagnosis present

## 2018-01-05 DIAGNOSIS — I639 Cerebral infarction, unspecified: Secondary | ICD-10-CM | POA: Diagnosis not present

## 2018-01-05 DIAGNOSIS — Z8261 Family history of arthritis: Secondary | ICD-10-CM

## 2018-01-05 DIAGNOSIS — E1165 Type 2 diabetes mellitus with hyperglycemia: Secondary | ICD-10-CM | POA: Diagnosis present

## 2018-01-05 DIAGNOSIS — I69398 Other sequelae of cerebral infarction: Secondary | ICD-10-CM

## 2018-01-05 DIAGNOSIS — R402362 Coma scale, best motor response, obeys commands, at arrival to emergency department: Secondary | ICD-10-CM | POA: Diagnosis present

## 2018-01-05 DIAGNOSIS — E039 Hypothyroidism, unspecified: Secondary | ICD-10-CM | POA: Diagnosis present

## 2018-01-05 DIAGNOSIS — Z79890 Hormone replacement therapy (postmenopausal): Secondary | ICD-10-CM

## 2018-01-05 DIAGNOSIS — R29810 Facial weakness: Secondary | ICD-10-CM | POA: Diagnosis present

## 2018-01-05 DIAGNOSIS — Z7982 Long term (current) use of aspirin: Secondary | ICD-10-CM

## 2018-01-05 DIAGNOSIS — R29701 NIHSS score 1: Secondary | ICD-10-CM | POA: Diagnosis present

## 2018-01-05 DIAGNOSIS — Z803 Family history of malignant neoplasm of breast: Secondary | ICD-10-CM

## 2018-01-05 DIAGNOSIS — R402142 Coma scale, eyes open, spontaneous, at arrival to emergency department: Secondary | ICD-10-CM | POA: Diagnosis present

## 2018-01-05 DIAGNOSIS — I10 Essential (primary) hypertension: Secondary | ICD-10-CM | POA: Diagnosis present

## 2018-01-05 LAB — COMPREHENSIVE METABOLIC PANEL
ALT: 24 U/L (ref 17–63)
AST: 27 U/L (ref 15–41)
Albumin: 4 g/dL (ref 3.5–5.0)
Alkaline Phosphatase: 78 U/L (ref 38–126)
Anion gap: 5 (ref 5–15)
BUN: 22 mg/dL — ABNORMAL HIGH (ref 6–20)
CO2: 28 mmol/L (ref 22–32)
Calcium: 8.9 mg/dL (ref 8.9–10.3)
Chloride: 105 mmol/L (ref 101–111)
Creatinine, Ser: 1.18 mg/dL (ref 0.61–1.24)
GFR calc Af Amer: >60 mL/min (ref >60)
GFR calc non Af Amer: >60 mL/min (ref >60)
Glucose, Bld: 116 mg/dL — ABNORMAL HIGH (ref 65–99)
Potassium: 4 mmol/L (ref 3.5–5.1)
Sodium: 138 mmol/L (ref 135–145)
Total Bilirubin: 0.6 mg/dL (ref 0.3–1.2)
Total Protein: 7.5 g/dL (ref 6.5–8.1)

## 2018-01-05 LAB — CBC
HCT: 41.8 % (ref 40.0–52.0)
Hemoglobin: 14.4 g/dL (ref 13.0–18.0)
MCH: 29.6 pg (ref 26.0–34.0)
MCHC: 34.6 g/dL (ref 32.0–36.0)
MCV: 85.6 fL (ref 80.0–100.0)
Platelets: 228 10*3/uL (ref 150–440)
RBC: 4.88 MIL/uL (ref 4.40–5.90)
RDW: 12.2 % (ref 11.5–14.5)
WBC: 5.9 10*3/uL (ref 3.8–10.6)

## 2018-01-05 LAB — PROTIME-INR
INR: 0.98
Prothrombin Time: 12.9 seconds (ref 11.4–15.2)

## 2018-01-05 LAB — DIFFERENTIAL
Basophils Absolute: 0 10*3/uL (ref 0–0.1)
Basophils Relative: 1 %
Eosinophils Absolute: 0.1 10*3/uL (ref 0–0.7)
Eosinophils Relative: 2 %
Lymphocytes Relative: 35 %
Lymphs Abs: 2.1 10*3/uL (ref 1.0–3.6)
Monocytes Absolute: 0.5 10*3/uL (ref 0.2–1.0)
Monocytes Relative: 8 %
Neutro Abs: 3.2 10*3/uL (ref 1.4–6.5)
Neutrophils Relative %: 54 %

## 2018-01-05 LAB — APTT: aPTT: 32 seconds (ref 24–36)

## 2018-01-05 LAB — GLUCOSE, CAPILLARY: Glucose-Capillary: 97 mg/dL (ref 65–99)

## 2018-01-05 LAB — TROPONIN I: Troponin I: <0.03 ng/mL (ref <0.03)

## 2018-01-05 NOTE — ED Triage Notes (Signed)
Patient ambulatory to triage with steady gait, without difficulty or distress noted; pt reports upon awakening this morning "felt like he was in a fog"; hx CVA in Jan; st returned from work and wife noted a slight left sided facial droop; pt denies any c/o at present; A&Ox3, MAEW, PERRL

## 2018-01-06 ENCOUNTER — Encounter: Payer: Self-pay | Admitting: Internal Medicine

## 2018-01-06 ENCOUNTER — Emergency Department: Payer: Managed Care, Other (non HMO)

## 2018-01-06 ENCOUNTER — Inpatient Hospital Stay
Admission: EM | Admit: 2018-01-06 | Discharge: 2018-01-07 | DRG: 066 | Disposition: A | Discharge status: Home / Self Care | Payer: Managed Care, Other (non HMO) | Attending: Internal Medicine | Admitting: Internal Medicine

## 2018-01-06 ENCOUNTER — Inpatient Hospital Stay: Payer: Managed Care, Other (non HMO)

## 2018-01-06 ENCOUNTER — Other Ambulatory Visit: Payer: Self-pay

## 2018-01-06 DIAGNOSIS — R402142 Coma scale, eyes open, spontaneous, at arrival to emergency department: Secondary | ICD-10-CM | POA: Diagnosis present

## 2018-01-06 DIAGNOSIS — Z794 Long term (current) use of insulin: Secondary | ICD-10-CM | POA: Diagnosis not present

## 2018-01-06 DIAGNOSIS — E785 Hyperlipidemia, unspecified: Secondary | ICD-10-CM | POA: Diagnosis present

## 2018-01-06 DIAGNOSIS — R29701 NIHSS score 1: Secondary | ICD-10-CM | POA: Diagnosis present

## 2018-01-06 DIAGNOSIS — I6389 Other cerebral infarction: Secondary | ICD-10-CM | POA: Diagnosis present

## 2018-01-06 DIAGNOSIS — Z833 Family history of diabetes mellitus: Secondary | ICD-10-CM | POA: Diagnosis not present

## 2018-01-06 DIAGNOSIS — R402252 Coma scale, best verbal response, oriented, at arrival to emergency department: Secondary | ICD-10-CM | POA: Diagnosis present

## 2018-01-06 DIAGNOSIS — I639 Cerebral infarction, unspecified: Secondary | ICD-10-CM | POA: Diagnosis present

## 2018-01-06 DIAGNOSIS — G510 Bell's palsy: Secondary | ICD-10-CM | POA: Diagnosis present

## 2018-01-06 DIAGNOSIS — Z7982 Long term (current) use of aspirin: Secondary | ICD-10-CM | POA: Diagnosis not present

## 2018-01-06 DIAGNOSIS — Z79890 Hormone replacement therapy (postmenopausal): Secondary | ICD-10-CM | POA: Diagnosis not present

## 2018-01-06 DIAGNOSIS — Z803 Family history of malignant neoplasm of breast: Secondary | ICD-10-CM | POA: Diagnosis not present

## 2018-01-06 DIAGNOSIS — E1140 Type 2 diabetes mellitus with diabetic neuropathy, unspecified: Secondary | ICD-10-CM | POA: Diagnosis present

## 2018-01-06 DIAGNOSIS — E1165 Type 2 diabetes mellitus with hyperglycemia: Secondary | ICD-10-CM | POA: Diagnosis present

## 2018-01-06 DIAGNOSIS — Z8261 Family history of arthritis: Secondary | ICD-10-CM | POA: Diagnosis not present

## 2018-01-06 DIAGNOSIS — R402362 Coma scale, best motor response, obeys commands, at arrival to emergency department: Secondary | ICD-10-CM | POA: Diagnosis present

## 2018-01-06 DIAGNOSIS — R29810 Facial weakness: Secondary | ICD-10-CM | POA: Diagnosis present

## 2018-01-06 DIAGNOSIS — I10 Essential (primary) hypertension: Secondary | ICD-10-CM | POA: Diagnosis present

## 2018-01-06 DIAGNOSIS — Z79899 Other long term (current) drug therapy: Secondary | ICD-10-CM | POA: Diagnosis not present

## 2018-01-06 DIAGNOSIS — I69398 Other sequelae of cerebral infarction: Secondary | ICD-10-CM | POA: Diagnosis not present

## 2018-01-06 DIAGNOSIS — Z87442 Personal history of urinary calculi: Secondary | ICD-10-CM | POA: Diagnosis not present

## 2018-01-06 DIAGNOSIS — E039 Hypothyroidism, unspecified: Secondary | ICD-10-CM | POA: Diagnosis present

## 2018-01-06 LAB — GLUCOSE, CAPILLARY
Glucose-Capillary: 121 mg/dL — ABNORMAL HIGH (ref 65–99)
Glucose-Capillary: 124 mg/dL — ABNORMAL HIGH (ref 65–99)
Glucose-Capillary: 137 mg/dL — ABNORMAL HIGH (ref 65–99)
Glucose-Capillary: 154 mg/dL — ABNORMAL HIGH (ref 65–99)

## 2018-01-06 LAB — HEMOGLOBIN A1C
Hgb A1c MFr Bld: 6.4 % — ABNORMAL HIGH (ref 4.8–5.6)
Mean Plasma Glucose: 136.98 mg/dL

## 2018-01-06 LAB — SEDIMENTATION RATE: Sed Rate: 26 mm/hr — ABNORMAL HIGH (ref 0–15)

## 2018-01-06 LAB — ANTITHROMBIN III: AntiThromb III Func: 95 % (ref 75–120)

## 2018-01-06 LAB — URINE DRUG SCREEN, QUALITATIVE (ARMC ONLY)
Amphetamines, Ur Screen: NOT DETECTED
Barbiturates, Ur Screen: NOT DETECTED
Benzodiazepine, Ur Scrn: NOT DETECTED
Cannabinoid 50 Ng, Ur ~~LOC~~: NOT DETECTED
Cocaine Metabolite,Ur ~~LOC~~: NOT DETECTED
MDMA (Ecstasy)Ur Screen: NOT DETECTED
Methadone Scn, Ur: NOT DETECTED
Opiate, Ur Screen: NOT DETECTED
Phencyclidine (PCP) Ur S: NOT DETECTED
Tricyclic, Ur Screen: NOT DETECTED

## 2018-01-06 LAB — ETHANOL: Alcohol, Ethyl (B): <10 mg/dL (ref <10)

## 2018-01-06 MED ORDER — ACETAMINOPHEN 325 MG PO TABS: 650.0000 mg | ORAL_TABLET | ORAL | Status: DC | PRN

## 2018-01-06 MED ORDER — ATORVASTATIN CALCIUM 20 MG PO TABS
40.0000 mg | ORAL_TABLET | Freq: Every day | ORAL | Status: DC
  Administered 2018-01-06: 40 mg via ORAL
  Filled 2018-01-06: qty 2

## 2018-01-06 MED ORDER — PREGABALIN 50 MG PO CAPS
100.0000 mg | ORAL_CAPSULE | Freq: Three times a day (TID) | ORAL | Status: DC
  Administered 2018-01-06 – 2018-01-07 (×4): 100 mg via ORAL
  Filled 2018-01-06 (×4): qty 2

## 2018-01-06 MED ORDER — STROKE: EARLY STAGES OF RECOVERY BOOK
Freq: Once | Status: AC
  Administered 2018-01-06: 05:00:00

## 2018-01-06 MED ORDER — ACETAMINOPHEN 650 MG RE SUPP: 650.0000 mg | RECTAL | Status: DC | PRN

## 2018-01-06 MED ORDER — INSULIN GLARGINE 100 UNIT/ML ~~LOC~~ SOLN
7.0000 [IU] | Freq: Once | SUBCUTANEOUS | Status: AC
  Administered 2018-01-06: 7 [IU] via SUBCUTANEOUS
  Filled 2018-01-06: qty 0.07

## 2018-01-06 MED ORDER — LEVOTHYROXINE SODIUM 112 MCG PO TABS
112.0000 ug | ORAL_TABLET | Freq: Every day | ORAL | Status: DC
  Administered 2018-01-06: 112 ug via ORAL
  Filled 2018-01-06 (×3): qty 1

## 2018-01-06 MED ORDER — ENOXAPARIN SODIUM 40 MG/0.4ML ~~LOC~~ SOLN
40.0000 mg | SUBCUTANEOUS | Status: DC
  Filled 2018-01-06: qty 0.4

## 2018-01-06 MED ORDER — INSULIN ASPART 100 UNIT/ML ~~LOC~~ SOLN
0.0000 [IU] | Freq: Three times a day (TID) | SUBCUTANEOUS | Status: DC
  Administered 2018-01-06: 12:00:00 2 [IU] via SUBCUTANEOUS
  Administered 2018-01-06: 18:00:00 3 [IU] via SUBCUTANEOUS
  Administered 2018-01-06: 08:00:00 2 [IU] via SUBCUTANEOUS
  Administered 2018-01-07: 3 [IU] via SUBCUTANEOUS
  Filled 2018-01-06 (×3): qty 1

## 2018-01-06 MED ORDER — ACETAMINOPHEN 160 MG/5ML PO SOLN
650.0000 mg | ORAL | Status: DC | PRN
  Filled 2018-01-06: qty 20.3

## 2018-01-06 MED ORDER — LOSARTAN POTASSIUM 50 MG PO TABS
50.0000 mg | ORAL_TABLET | Freq: Every day | ORAL | Status: DC
  Administered 2018-01-06 – 2018-01-07 (×2): 50 mg via ORAL
  Filled 2018-01-06 (×2): qty 1

## 2018-01-06 MED ORDER — BISACODYL 5 MG PO TBEC: 5.0000 mg | DELAYED_RELEASE_TABLET | Freq: Every day | ORAL | Status: DC | PRN

## 2018-01-06 MED ORDER — IOPAMIDOL (ISOVUE-370) INJECTION 76%
75.0000 mL | Freq: Once | INTRAVENOUS | Status: AC | PRN
  Administered 2018-01-06: 75 mL via INTRAVENOUS

## 2018-01-06 MED ORDER — INSULIN GLARGINE 100 UNIT/ML ~~LOC~~ SOLN
15.0000 [IU] | Freq: Every day | SUBCUTANEOUS | Status: DC
  Filled 2018-01-06 (×2): qty 0.15

## 2018-01-06 MED ORDER — PREGABALIN 50 MG PO CAPS
100.0000 mg | ORAL_CAPSULE | Freq: Once | ORAL | Status: AC
  Administered 2018-01-06: 05:00:00 100 mg via ORAL
  Filled 2018-01-06: qty 2

## 2018-01-06 MED ORDER — CLOPIDOGREL BISULFATE 75 MG PO TABS
75.0000 mg | ORAL_TABLET | Freq: Once | ORAL | Status: AC
  Administered 2018-01-06: 75 mg via ORAL
  Filled 2018-01-06: qty 1

## 2018-01-06 MED ORDER — SENNOSIDES-DOCUSATE SODIUM 8.6-50 MG PO TABS: 1.0000 | ORAL_TABLET | Freq: Every evening | ORAL | Status: DC | PRN

## 2018-01-06 MED ORDER — ASPIRIN EC 81 MG PO TBEC
81.0000 mg | DELAYED_RELEASE_TABLET | Freq: Every day | ORAL | Status: DC
  Administered 2018-01-07: 13:00:00 81 mg via ORAL
  Filled 2018-01-06: qty 1

## 2018-01-06 MED ORDER — CLOPIDOGREL BISULFATE 75 MG PO TABS
75.0000 mg | ORAL_TABLET | Freq: Every day | ORAL | Status: DC
  Administered 2018-01-06 – 2018-01-07 (×2): 75 mg via ORAL
  Filled 2018-01-06 (×2): qty 1

## 2018-01-06 MED ORDER — PREGABALIN 50 MG PO CAPS: 100.0000 mg | ORAL_CAPSULE | Freq: Three times a day (TID) | ORAL | Status: DC

## 2018-01-06 MED ORDER — ASPIRIN 81 MG PO CHEW
324.0000 mg | CHEWABLE_TABLET | Freq: Once | ORAL | Status: AC
  Administered 2018-01-06: 324 mg via ORAL
  Filled 2018-01-06: qty 4

## 2018-01-06 NOTE — Progress Notes (Signed)
Consent signed for TEE, placed in chart. Madlyn Frankel, RN

## 2018-01-06 NOTE — Progress Notes (Signed)
Admitted this morning for left facial droop and found to have small acute right putamen/caudate stroke.  Patient has no other complaints.  Wife noticed that he had left facial droop after he came from  workyesterday.  No weakness in hands or legs.  Spoke with Dr. Doy Mince from neurology recommended TEE. 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  Continue Plavix, statins, recommended TEE I spoke with Dr. Lucita Ferrara.  Patient already had ultrasound of carotids in January which did not show any hemodynamically significant stenosis. #2 diabetes mellitus type 2: If patient is n.p.o. after midnight for TEE tomorrow decrease the Lantus to half dose tonight. 3.  Essential hypertension: Controlled Discussed with wife, Dr.Reynolds   time spent 25 minutes

## 2018-01-06 NOTE — Consult Note (Signed)
Referring Physician: Vianne Bulls    Chief Complaint: Facial droop  HPI: Gregory Crane is an 47 y.o. male with a recent history of stroke in January of this year felt to be secondary to noncompliance who reports waking up on Monday (01/05/2018) morning and felt "foggy". He attributed this to waking up 1-1.5hrs earlier for a new job he just started (though he does note he felt similarly "foggy" when he had a CVA in 08/2017). The pt states he went through his day as usual, and the "fog" lifted at around lunchtime. He finished his day, and went home. He got home @~1815PM, and states his wife immediately noticed a L-sided lip droop. Apart from the lip droop, the pt only endorsed fatigue/malaise/tiredness, but otherwise felt fine.  Initial NIHSS of 1.  Patient reports that when he was worked up for his Bell's Palsy MRI of the brain at that time showed a chronic infarct.  Patient had no recollection of symptoms.    Date last known well: Date: 01/04/2018 Time last known well: Unable to determine tPA Given: No: Outside time window  Past Medical History:  Diagnosis Date  . Allergy   . Bell's palsy   . Diabetes mellitus without complication (HCC)    diet controlled  . Hypertension   . Hypothyroidism   . Kidney stones   . Stroke [facility])     Past Surgical History:  Procedure Laterality Date  . LUMBAR PUNCTURE     as child  . NO PAST SURGERIES      Family History  Problem Relation Age of Onset  . Breast cancer Mother   . Diabetes Father   . Diabetes Sister   . Arthritis Maternal Grandmother   . Diabetes Maternal Grandmother   Mother with a history of multiple miscarriages.     Social History:  reports that he has never smoked. He has never used smokeless tobacco. He reports that he does not drink alcohol or use drugs.  Allergies: No Known Allergies  Medications:  I have reviewed the patient's current medications. Prior to Admission:  Medications Prior to Admission  Medication Sig  Dispense Refill Last Dose  . aspirin 325 MG tablet Take 1 tablet (325 mg total) by mouth daily. 30 tablet 0 Taking  . atorvastatin (LIPITOR) 40 MG tablet TAKE 1 TABLET BY MOUTH DAILY AT 6:00 PM 30 tablet 0   . glucose blood (FREESTYLE LITE) test strip Check blood sugars twice a day (Dx. 250.02) 200 each 1 Taking  . insulin glargine (LANTUS) 100 UNIT/ML injection Inject 0.15 mLs (15 Units total) into the skin at bedtime. 10 mL 0   . insulin lispro (HUMALOG) 100 UNIT/ML injection Inject 0.04-0.06 mLs (4-6 Units total) into the skin 3 (three) times daily. 10 mL 2   . INSULIN SYRINGE .5CC/29G 29G X 1/2" 0.5 ML MISC USE AS DIRECTED WITH INSULIN, 1 NEEDLE PER USE 100 each 0   . Insulin Syringe-Needle U-100 27G X 1/2" 1 ML MISC Use as directed with insulin. One needle per use. 100 each 2   . levothyroxine (SYNTHROID) 112 MCG tablet Take 1 tablet (112 mcg total) by mouth daily before breakfast. 90 tablet 1   . losartan (COZAAR) 50 MG tablet Take 1 tablet (50 mg total) by mouth daily. 90 tablet 1   . LYRICA 100 MG capsule TAKE 1 CAPSULE BY MOUTH THREE TIMES DAILY 90 capsule 0   . metFORMIN (GLUCOPHAGE) 500 MG tablet Take 1 tablet (500 mg total) by mouth daily  with breakfast. 90 tablet 1   . ONETOUCH DELICA LANCETS FINE MISC 1 applicator by Other route 4 (four) times daily.  0   . pregabalin (LYRICA) 100 MG capsule Take 1 capsule (100 mg total) by mouth 3 (three) times daily. 30 capsule 0    Scheduled: . atorvastatin  40 mg Oral q1800  . clopidogrel  75 mg Oral Daily  . enoxaparin (LOVENOX) injection  40 mg Subcutaneous Q24H  . insulin aspart  0-15 Units Subcutaneous TID WC  . insulin glargine  15 Units Subcutaneous QHS  . levothyroxine  112 mcg Oral QAC breakfast  . losartan  50 mg Oral Daily  . pregabalin  100 mg Oral TID    ROS: History obtained from the patient  General ROS: negative for - chills, fatigue, fever, night sweats, weight gain or weight loss Psychological ROS: negative for -  behavioral disorder, hallucinations, memory difficulties, mood swings or suicidal ideation Ophthalmic ROS: negative for - blurry vision, double vision, eye pain or loss of vision ENT ROS: negative for - epistaxis, nasal discharge, oral lesions, sore throat, tinnitus or vertigo Allergy and Immunology ROS: negative for - hives or itchy/watery eyes Hematological and Lymphatic ROS: negative for - bleeding problems, bruising or swollen lymph nodes Endocrine ROS: negative for - galactorrhea, hair pattern changes, polydipsia/polyuria or temperature intolerance Respiratory ROS: negative for - cough, hemoptysis, shortness of breath or wheezing Cardiovascular ROS: LE edema Gastrointestinal ROS: negative for - abdominal pain, diarrhea, hematemesis, nausea/vomiting or stool incontinence Genito-Urinary ROS: negative for - dysuria, hematuria, incontinence or urinary frequency/urgency Musculoskeletal ROS: negative for - joint swelling or muscular weakness Neurological ROS: as noted in HPI Dermatological ROS: negative for rash and skin lesion changes  Physical Examination: Blood pressure 138/74, pulse 74, temperature 97.6 F (36.4 C), temperature source Oral, resp. rate 18, height 6' (1.829 m), weight 124.2 kg (273 lb 14.4 oz), SpO2 95 %.  HEENT-  Normocephalic, no lesions, without obvious abnormality.  Normal external eye and conjunctiva.  Normal TM's bilaterally.  Normal auditory canals and external ears. Normal external nose, mucus membranes and septum.  Normal pharynx. Cardiovascular- S1, S2 normal, pulses palpable throughout   Lungs- chest clear, no wheezing, rales, normal symmetric air entry Abdomen- soft, non-tender; bowel sounds normal; no masses,  no organomegaly Extremities- LE edema Lymph-no adenopathy palpable Musculoskeletal-no joint tenderness, deformity or swelling Skin-warm and dry, no hyperpigmentation, vitiligo, or suspicious lesions  Neurological Examination   Mental Status: Alert,  oriented, thought content appropriate.  Speech fluent without evidence of aphasia.  Able to follow 3 step commands without difficulty. Cranial Nerves: II: Discs flat bilaterally; Visual fields grossly normal, pupils equal, round, reactive to light and accommodation III,IV, VI: left facial droop, extra-ocular motions intact bilaterally V,VII: smile symmetric, facial light touch sensation normal bilaterally VIII: hearing normal bilaterally IX,X: gag reflex present XI: bilateral shoulder shrug XII: midline tongue extension Motor: Right : Upper extremity   5/5    Left:     Upper extremity   5/5  Lower extremity   5/5     Lower extremity   5/5 Tone and bulk:normal tone throughout; no atrophy noted Sensory: Pinprick and light touch intact throughout, bilaterally Deep Tendon Reflexes: 2+ in the upper extremities and absent in the lower extremities Plantars: Right: mute   Left: mute Cerebellar: Normal finger-to-nose and normal heel-to-shin testing bilaterally Gait: not tested due to safety concerns    Laboratory Studies:  Basic Metabolic Panel: Recent Labs  Lab 01/05/18 2042  NA  138  K 4.0  CL 105  CO2 28  GLUCOSE 116*  BUN 22*  CREATININE 1.18  CALCIUM 8.9    Liver Function Tests: Recent Labs  Lab 01/05/18 2042  AST 27  ALT 24  ALKPHOS 78  BILITOT 0.6  PROT 7.5  ALBUMIN 4.0   No results for input(s): LIPASE, AMYLASE in the last 168 hours. No results for input(s): AMMONIA in the last 168 hours.  CBC: Recent Labs  Lab 01/05/18 2042  WBC 5.9  NEUTROABS 3.2  HGB 14.4  HCT 41.8  MCV 85.6  PLT 228    Cardiac Enzymes: Recent Labs  Lab 01/05/18 2042  TROPONINI <0.03    BNP: Invalid input(s): POCBNP  CBG: Recent Labs  Lab 01/05/18 05-Dec-2036 01/06/18 0502 01/06/18 0732  GLUCAP 97 121* 124*    Microbiology: No results found for this or any previous visit.  Coagulation Studies: Recent Labs    01/05/18 12/05/2040  LABPROT 12.9  INR 0.98    Urinalysis: No  results for input(s): COLORURINE, LABSPEC, PHURINE, GLUCOSEU, HGBUR, BILIRUBINUR, KETONESUR, PROTEINUR, UROBILINOGEN, NITRITE, LEUKOCYTESUR in the last 168 hours.  Invalid input(s): APPERANCEUR  Lipid Panel:    Component Value Date/Time   CHOL 181 09/13/2017 0555   CHOL 164 03/25/2017 1036   TRIG 231 (H) 09/13/2017 0555   HDL 30 (L) 09/13/2017 0555   HDL 29 (L) 03/25/2017 1036   CHOLHDL 6.0 09/13/2017 0555   VLDL 46 (H) 09/13/2017 0555   LDLCALC 105 (H) 09/13/2017 0555   LDLCALC 66 03/25/2017 1036    HgbA1C:  Lab Results  Component Value Date   HGBA1C 10.8 (H) 09/13/2017    Urine Drug Screen:      Component Value Date/Time   LABOPIA NONE DETECTED 01/06/2018 0236   COCAINSCRNUR NONE DETECTED 01/06/2018 0236   LABBENZ NONE DETECTED 01/06/2018 0236   AMPHETMU NONE DETECTED 01/06/2018 0236   THCU NONE DETECTED 01/06/2018 0236   LABBARB NONE DETECTED 01/06/2018 0236    Alcohol Level:  Recent Labs  Lab 01/06/18 0236  ETH <10    Other results: EKG: normal sinus rhythm at 67 bpm.  Imaging: Ct Head Wo Contrast  Result Date: 01/05/2018 CLINICAL DATA:  Left-sided facial droop. EXAM: CT HEAD WITHOUT CONTRAST TECHNIQUE: Contiguous axial images were obtained from the base of the skull through the vertex without intravenous contrast. COMPARISON:  CT scan of September 12, 2017. FINDINGS: Brain: Stable old right periventricular white matter infarction is noted. No mass effect or midline shift is noted. Ventricular size is within normal limits. There is no evidence of mass lesion, hemorrhage or acute infarction. Vascular: No hyperdense vessel or unexpected calcification. Skull: Normal. Negative for fracture or focal lesion. Sinuses/Orbits: No acute finding. Other: None. IMPRESSION: No acute intracranial abnormality seen. Electronically Signed   By: Marijo Conception, M.D.   On: 01/05/2018 21:12   Mr Brain Wo Contrast  Result Date: 01/06/2018 CLINICAL DATA:  47 y/o M; left facial droop.  Awoke feeling like he was in a fog. Neuro deficit(s), subacute. EXAM: MRI HEAD WITHOUT CONTRAST TECHNIQUE: Multiplanar, multiecho pulse sequences of the brain and surrounding structures were obtained without intravenous contrast. COMPARISON:  01/05/2018 CT head.  09/12/2017 MRI head. FINDINGS: Brain: Small focus of reduced diffusion extending from right putamen to caudate body traversing the corona radiata compatible with acute/early subacute infarction. No associated hemorrhage or mass effect. Just superior to the infarct is a focus of encephalomalacia involving the caudate body and lentiform nucleus from prior infarction  as seen on the prior MRI of the brain. No focal mass effect, extra-axial collection, hydrocephalus, or extra-axial collection. Vascular: Normal flow voids. Skull and upper cervical spine: Normal marrow signal. Sinuses/Orbits: Negative. Other: None. IMPRESSION: 1. Small acute/early subacute infarction extending from right putamen to caudate body traversing the corona radiata. No hemorrhage or mass effect. 2. Adjacent chronic lacunar infarct in the right basal ganglia. These results were called by telephone at the time of interpretation on 01/06/2018 at 2:17 am to Dr. Charlesetta Ivory , who verbally acknowledged these results. Electronically Signed   By: Kristine Garbe M.D.   On: 01/06/2018 02:19    Assessment: 47 y.o. male presenting with left facial droop.  MRI of the brain reviewed and shows an acute right putamen infarct.  Although this may be secondary to small vessel disease (patient with multiple vascular risk factors), patient less than 46 years of age and has had three infarcts.  Concern for embolic or hypercoagulable etiology.  Further work up recommended.  Patient with a stroke work up in January of this year that included a carotid doppler and echocardiogram that were unremarkable.  Would not repeat at this time.  On ASA at home.  Reports reasonable compliance.    Stroke  Risk Factors - diabetes mellitus, hyperlipidemia and hypertension  Plan: 1. HgbA1c, fasting lipid panel, hypercoagulable panel 2. PT consult, OT consult, Speech consult 3. TEE with LINQ placement if unremarkable.   4. CTA of the head and neck 5. Prophylactic therapy-ASA 81mg  and Plavix 75mg  daily 6. NPO until RN stroke swallow screen 7. Telemetry monitoring 8. Frequent neuro checks   Alexis Goodell, MD Neurology [phone] 01/06/2018, 10:33 AM

## 2018-01-06 NOTE — H&P (Signed)
[facility] [place] NAME: Gregory Crane    MR#:  [id number]  DATE OF BIRTH:  09-21-1970  DATE OF ADMISSION:  01/06/2018  PRIMARY CARE PHYSICIAN: Gregory Pheasant, MD   REQUESTING/REFERRING PHYSICIAN: Loney Hering, MD  CHIEF COMPLAINT:   Chief Complaint  Patient presents with  . Facial Droop    HISTORY OF PRESENT ILLNESS:  Gregory Crane  is a 47 y.o. male with a known history of T2IDDM, HTN, HLD, CVA (09/12/2017, w/ residual balance/equilibrium problems) p/w 1d Hx facial droop/acute CVA. Pt states that he is medication adherent for the most part, but did miss taking ASA x2d (~1wk ago), because he ran out of medication, but he has resumed taking it since. He denies illicit drug use. He states he woke up on Monday (01/05/2018) morning and felt "foggy". He attributed this to waking up 1-1.5hrs earlier for a new job he just started (though he does note he felt similarly "foggy" when he had a CVA in 08/2017). The pt states he went through his day as usual, and the "fog" lifted at around lunchtime. He finished his day, and went home. He got home @~1815PM, and states his wife immediately noticed a L-sided lip droop. Apart from the lip droop, the pt only endorses fatigue/malaise/tiredness, but otherwise feels fine. He denies drooling, slurred speech, aphasia, dysarthria, unilateral (or any) motor weakness, sensory deficits/changes, acute changes in hearing/vision, HA, vertigo, ataxia, worsening imbalance/dysequilibrium, LH/LOC, falls. He has not had any difficulty with his gait/ambulation, function or ADLs/IADLs. He is without complaint at the time of my assessment. His BP is mildly elevated, but VSS. He has a normal cardiopulmonary examination, and the entirety of his neurological examination (sensory, motor, reflex, cerebellar, cranial nerve) is non-focal. MRI brain (+) "Small acute/early subacute infarction extending from right putamen to caudate body  traversing the corona radiata. No hemorrhage or mass effect. Adjacent chronic lacunar infarct in the right basal ganglia." Prior MRI brain from 09/12/2017 demonstrated, "Acute to early subacute right basal ganglia/corona radiata nonhemorrhagic infarct. Old left basal ganglia lacunar infarct."  PAST MEDICAL HISTORY:   Past Medical History:  Diagnosis Date  . Allergy   . Bell's palsy   . Diabetes mellitus without complication (HCC)    diet controlled  . Hypertension   . Hypothyroidism   . Kidney stones   . Stroke [facility])     PAST SURGICAL HISTORY:   Past Surgical History:  Procedure Laterality Date  . LUMBAR PUNCTURE     as child  . NO PAST SURGERIES      SOCIAL HISTORY:   Social History   Tobacco Use  . Smoking status: Never Smoker  . Smokeless tobacco: Never Used  Substance Use Topics  . Alcohol use: No    Alcohol/week: 0.0 oz    Comment: very rarely (1-2/year)    FAMILY HISTORY:   Family History  Problem Relation Age of Onset  . Breast cancer Mother   . Diabetes Father   . Diabetes Sister   . Arthritis Maternal Grandmother   . Diabetes Maternal Grandmother     DRUG ALLERGIES:  No Known Allergies  REVIEW OF SYSTEMS:   Review of Systems  Constitutional: Positive for malaise/fatigue. Negative for chills, diaphoresis, fever and weight loss.  HENT: Negative for congestion, ear pain, hearing loss, nosebleeds, sinus pain, sore throat and tinnitus.   Eyes: Negative for blurred vision, double vision and photophobia.  Respiratory: Negative for cough, hemoptysis, sputum production, shortness  of breath and wheezing.   Cardiovascular: Negative for chest pain, palpitations, orthopnea, claudication, leg swelling and PND.  Gastrointestinal: Negative for abdominal pain, blood in stool, constipation, diarrhea, heartburn, melena, nausea and vomiting.  Genitourinary: Negative for dysuria, frequency, hematuria and urgency.  Musculoskeletal: Negative for back pain, joint pain,  myalgias and neck pain.  Skin: Negative for itching and rash.  Neurological: Negative for dizziness, tingling, tremors, sensory change, speech change, focal weakness, seizures, loss of consciousness, weakness and headaches.  Psychiatric/Behavioral: Negative for memory loss. The patient does not have insomnia.    MEDICATIONS AT HOME:   Prior to Admission medications   Medication Sig Start Date End Date Taking? Authorizing Provider  aspirin 325 MG tablet Take 1 tablet (325 mg total) by mouth daily. 09/15/17   Gregory Grayer, MD  atorvastatin (LIPITOR) 40 MG tablet TAKE 1 TABLET BY MOUTH DAILY AT 6:00 PM 12/22/17   Gregory Pheasant, MD  glucose blood (FREESTYLE LITE) test strip Check blood sugars twice a day (Dx. 250.02) 04/18/17   Gregory Pheasant, MD  insulin glargine (LANTUS) 100 UNIT/ML injection Inject 0.15 mLs (15 Units total) into the skin at bedtime. 10/24/17   Gregory Pheasant, MD  insulin lispro (HUMALOG) 100 UNIT/ML injection Inject 0.04-0.06 mLs (4-6 Units total) into the skin 3 (three) times daily. 12/16/17   Gregory Pheasant, MD  INSULIN SYRINGE .5CC/29G 29G X 1/2" 0.5 ML MISC USE AS DIRECTED WITH INSULIN, 1 NEEDLE PER USE 12/26/17   Gregory Pheasant, MD  Insulin Syringe-Needle U-100 27G X 1/2" 1 ML MISC Use as directed with insulin. One needle per use. 12/01/17   Gregory Pheasant, MD  levothyroxine (SYNTHROID) 112 MCG tablet Take 1 tablet (112 mcg total) by mouth daily before breakfast. 10/24/17   Gregory Pheasant, MD  losartan (COZAAR) 50 MG tablet Take 1 tablet (50 mg total) by mouth daily. 10/27/17   Gregory Pheasant, MD  LYRICA 100 MG capsule TAKE 1 CAPSULE BY MOUTH THREE TIMES DAILY 12/29/17   Gregory Pheasant, MD  metFORMIN (GLUCOPHAGE) 500 MG tablet Take 1 tablet (500 mg total) by mouth daily with breakfast. 11/17/17 11/17/18  Gregory Pheasant, MD  [facility] DELICA LANCETS FINE MISC 1 applicator by Other route 4 (four) times daily. 10/06/17   [provider]  pregabalin (LYRICA) 100 MG capsule  Take 1 capsule (100 mg total) by mouth 3 (three) times daily. 12/27/17   Gregory Passy, MD      VITAL SIGNS:  Blood pressure (!) 156/79, pulse 67, temperature 98.3 F (36.8 C), temperature source Oral, resp. rate 18, height 6' (1.829 m), weight 124.3 kg (274 lb), SpO2 95 %.  PHYSICAL EXAMINATION:  Physical Exam  Constitutional: He is oriented to person, place, and time. He appears well-developed and well-nourished. He is active and cooperative.  Non-toxic appearance. He does not have a sickly appearance. He does not appear ill. No distress. He is not intubated.  HENT:  Head: Normocephalic and atraumatic.  Mouth/Throat: Oropharynx is clear and moist. No oropharyngeal exudate.  Eyes: Pupils are equal, round, and reactive to light. Conjunctivae, EOM and lids are normal. No scleral icterus.  Neck: Neck supple. No JVD present. No thyromegaly present.  Cardiovascular: Normal rate, regular rhythm, S1 normal, S2 normal and normal heart sounds.  No extrasystoles are present. Exam reveals no gallop, no S3, no S4, no distant heart sounds and no friction rub.  No murmur heard. Pulmonary/Chest: Effort normal and breath sounds normal. No accessory muscle usage or stridor. No apnea, no tachypnea and  no bradypnea. He is not intubated. No respiratory distress. He has no decreased breath sounds. He has no wheezes. He has no rhonchi. He has no rales.  Abdominal: Soft. Bowel sounds are normal. He exhibits no distension. There is no tenderness. There is no rebound and no guarding.  Musculoskeletal: Normal range of motion. He exhibits no edema or tenderness.  Lymphadenopathy:    He has no cervical adenopathy.  Neurological: He is alert and oriented to person, place, and time. He has normal strength and normal reflexes. He is not disoriented. He displays no atrophy, no tremor and normal reflexes. No cranial nerve deficit or sensory deficit. He exhibits normal muscle tone. He displays no seizure activity. Coordination  and gait normal. GCS eye subscore is 4. GCS verbal subscore is 5. GCS motor subscore is 6.  Skin: Skin is warm, dry and intact. No rash noted. He is not diaphoretic. No erythema.  Psychiatric: He has a normal mood and affect. His speech is normal and behavior is normal. Judgment and thought content normal. Cognition and memory are normal.   LABORATORY PANEL:   CBC Recent Labs  Lab 01/05/18 2042  WBC 5.9  HGB 14.4  HCT 41.8  PLT 228   ------------------------------------------------------------------------------------------------------------------  Chemistries  Recent Labs  Lab 01/05/18 2042  NA 138  K 4.0  CL 105  CO2 28  GLUCOSE 116*  BUN 22*  CREATININE 1.18  CALCIUM 8.9  AST 27  ALT 24  ALKPHOS 78  BILITOT 0.6   ------------------------------------------------------------------------------------------------------------------  Cardiac Enzymes Recent Labs  Lab 01/05/18 2042  TROPONINI <0.03   ------------------------------------------------------------------------------------------------------------------  RADIOLOGY:  Ct Head Wo Contrast  Result Date: 01/05/2018 CLINICAL DATA:  Left-sided facial droop. EXAM: CT HEAD WITHOUT CONTRAST TECHNIQUE: Contiguous axial images were obtained from the base of the skull through the vertex without intravenous contrast. COMPARISON:  CT scan of September 12, 2017. FINDINGS: Brain: Stable old right periventricular white matter infarction is noted. No mass effect or midline shift is noted. Ventricular size is within normal limits. There is no evidence of mass lesion, hemorrhage or acute infarction. Vascular: No hyperdense vessel or unexpected calcification. Skull: Normal. Negative for fracture or focal lesion. Sinuses/Orbits: No acute finding. Other: None. IMPRESSION: No acute intracranial abnormality seen. Electronically Signed   By: Marijo Conception, M.D.   On: 01/05/2018 21:12   Mr Brain Wo Contrast  Result Date: 01/06/2018 CLINICAL  DATA:  47 y/o M; left facial droop. Awoke feeling like he was in a fog. Neuro deficit(s), subacute. EXAM: MRI HEAD WITHOUT CONTRAST TECHNIQUE: Multiplanar, multiecho pulse sequences of the brain and surrounding structures were obtained without intravenous contrast. COMPARISON:  01/05/2018 CT head.  09/12/2017 MRI head. FINDINGS: Brain: Small focus of reduced diffusion extending from right putamen to caudate body traversing the corona radiata compatible with acute/early subacute infarction. No associated hemorrhage or mass effect. Just superior to the infarct is a focus of encephalomalacia involving the caudate body and lentiform nucleus from prior infarction as seen on the prior MRI of the brain. No focal mass effect, extra-axial collection, hydrocephalus, or extra-axial collection. Vascular: Normal flow voids. Skull and upper cervical spine: Normal marrow signal. Sinuses/Orbits: Negative. Other: None. IMPRESSION: 1. Small acute/early subacute infarction extending from right putamen to caudate body traversing the corona radiata. No hemorrhage or mass effect. 2. Adjacent chronic lacunar infarct in the right basal ganglia. These results were called by telephone at the time of interpretation on 01/06/2018 at 2:17 am to Dr. Charlesetta Ivory , who  verbally acknowledged these results. Electronically Signed   By: Kristine Garbe M.D.   On: 01/06/2018 02:19   IMPRESSION AND PLAN:   A/P: 47M w/ Hx CVA p/w acute/subacute CVA, ASA failure, hyperglycemia (w/ T2IDDM + neuropathy). -CVA pathway w/o rpt imaging, neuro checks -D/Ced ASA, started Plavix 75 -Neuro consult -Non-focal; Not ordered for P/T, O/T, SLP, aspiration precautions or fall precautions -SSI -c/w home meds -Cardiac diabetic diet -Lovenox -Full code -Admission, >  2 midnights   All the records are reviewed and case discussed with ED provider. Management plans discussed with the patient, family and they are in agreement.  CODE STATUS: Full  code  TOTAL TIME TAKING CARE OF THIS PATIENT: 60 minutes.    Arta Silence M.D on 01/06/2018 at 3:05 AM  Between 7am to 6pm - Pager - [phone]  After 6pm go to [email or web address] - Proofreader  [facility]  Office  [phone]  CC: Primary care physician; Gregory Pheasant, MD   Note: This dictation was prepared with Dragon dictation along with smaller phrase technology. Any transcriptional errors that result from this process are unintentional.

## 2018-01-06 NOTE — ED Notes (Signed)
Pt reported having Hx of stroke in past. Pt is alert and oriented x 4. Wife at bedside. Wife noticed he had a droop in his mouth area this morning. Pt also reported that he felt like he was in a "fog" so they decided to come to ed.

## 2018-01-06 NOTE — ED Provider Notes (Signed)
[facility] Emergency Department Provider Note   ____________________________________________   First MD Initiated Contact with Patient 01/06/18 0016     (approximate)  I have reviewed the triage vital signs and the nursing notes.   HISTORY  Chief Complaint Facial Droop    HPI Gregory Crane is a 47 y.o. male who comes into the hospital today with some left-sided facial droop.  The patient states that his wife noticed that his face was drooping after he got home from work.  The patient was here in January secondary to a stroke.  He states that he did not have much deficit aside from some fatigue and fine motor difficulties.  He reports that he woke up this morning and he felt like he was in a fog.  He had difficulty focusing but thought it was mainly due to fatigue.  The symptoms lasted until about lunchtime.  When the patient returned home from work at about 32 the patient's wife noticed that his left face was droopy.  He denies any weakness in his arms or legs and denies any numbness or tingling outside of the ordinary.  The patient does have a history of peripheral neuropathy.  The patient also denies any difficulty with his speech.  He decided to come into the hospital for evaluation of his symptoms.   Past Medical History:  Diagnosis Date  . Allergy   . Bell's palsy   . Diabetes mellitus without complication (HCC)    diet controlled  . Hypertension   . Hypothyroidism   . Kidney stones   . Stroke [facility])     Patient Active Problem List   Diagnosis Date Noted  . Acute CVA (cerebrovascular accident) ([place]) 01/06/2018  . CVA (cerebral vascular accident) ([place]) 09/12/2017  . Cough 01/20/2017  . Bell's palsy 11/10/2016  . History of CVA (cerebrovascular accident) 11/10/2016  . 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 History of nephrolithiasis 10/27/2016  . TIA (transient ischemic attack) 10/20/2016  . Near  syncope 06/23/2016  . 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 Neuropathy 08/06/2015  . Health care maintenance 08/06/2015  . Essential hypertension 08/06/2015  . Heme positive stool 10/10/2013  . Hypothyroidism 10/10/2013  . Microalbuminuria 10/10/2013  . Hyperlipidemia 10/10/2013  . B12 deficiency 10/10/2013  . Diabetes ([place]) 07/04/2013  . Environmental allergies 07/04/2013    Past Surgical History:  Procedure Laterality Date  . LUMBAR PUNCTURE     as child  . NO PAST SURGERIES      Prior to Admission medications   Medication Sig Start Date End Date Taking? Authorizing Provider  aspirin 325 MG tablet Take 1 tablet (325 mg total) by mouth daily. 09/15/17   Loletha Grayer, MD  atorvastatin (LIPITOR) 40 MG tablet TAKE 1 TABLET BY MOUTH DAILY AT 6:00 PM 12/22/17   [name], MD  glucose blood (FREESTYLE LITE) test strip Check blood sugars twice a day (Dx. 250.02) 04/18/17   [name], MD  insulin glargine (LANTUS) 100 UNIT/ML injection Inject 0.15 mLs (15 Units total) into the skin at bedtime. 10/24/17   [name], MD  insulin lispro (HUMALOG) 100 UNIT/ML injection Inject 0.04-0.06 mLs (4-6 Units total) into the skin 3 (three) times daily. 12/16/17   [name], MD  INSULIN SYRINGE .5CC/29G 29G X 1/2" 0.5 ML MISC USE AS DIRECTED WITH INSULIN, 1 NEEDLE PER USE 12/26/17   [name], MD  Insulin Syringe-Needle U-100 27G X 1/2" 1 ML MISC Use as directed  with insulin. One needle per use. 12/01/17   [name], MD  levothyroxine (SYNTHROID) 112 MCG tablet Take 1 tablet (112 mcg total) by mouth daily before breakfast. 10/24/17   [name], MD  losartan (COZAAR) 50 MG tablet Take 1 tablet (50 mg total) by mouth daily. 10/27/17   [name], MD  LYRICA 100 MG capsule TAKE 1 CAPSULE BY MOUTH THREE TIMES DAILY 12/29/17   [name], MD  metFORMIN (GLUCOPHAGE) 500 MG tablet Take 1 tablet (500 mg total) by mouth daily with breakfast. 11/17/17 11/17/18  [name], MD    [facility] DELICA LANCETS FINE MISC 1 applicator by Other route 4 (four) times daily. 10/06/17   [provider]  pregabalin (LYRICA) 100 MG capsule Take 1 capsule (100 mg total) by mouth 3 (three) times daily. 12/27/17   Lucille Passy, MD    Allergies Patient has no known allergies.  Family History  Problem Relation Age of Onset  . Breast cancer Mother   . Diabetes Father   . Diabetes Sister   . Arthritis Maternal Grandmother   . Diabetes Maternal Grandmother     Social History Social History   Tobacco Use  . Smoking status: Never Smoker  . Smokeless tobacco: Never Used  Substance Use Topics  . Alcohol use: No    Alcohol/week: 0.0 oz    Comment: very rarely (1-2/year)  . Drug use: No    Review of Systems  Constitutional: Fatigue Eyes: No visual changes. ENT: No sore throat. Cardiovascular: Denies chest pain. Respiratory: Denies shortness of breath. Gastrointestinal: No abdominal pain.  No nausea, no vomiting.  No diarrhea.  No constipation. Genitourinary: Negative for dysuria. Musculoskeletal: Negative for back pain. Skin: Negative for rash. Neurological: Left-sided facial droop   ____________________________________________   PHYSICAL EXAM:  VITAL SIGNS: ED Triage Vitals [01/05/18 2035]  Enc Vitals Group     BP (!) 158/81     Pulse Rate 70     Resp 18     Temp 98.3 F (36.8 C)     Temp Source Oral     SpO2 97 %     Weight 274 lb (124.3 kg)     Height 6' (1.829 m)     Head Circumference      Peak Flow      Pain Score 0     Pain Loc      Pain Edu?      Excl. in Lane?     Constitutional: Alert and oriented. Well appearing and in mild distress. Eyes: Conjunctivae are normal. PERRL. EOMI. Head: Atraumatic. Nose: No congestion/rhinnorhea. Mouth/Throat: Mucous membranes are moist.  Oropharynx non-erythematous. Cardiovascular: Normal rate, regular rhythm. Grossly normal heart sounds.  Good peripheral circulation. Respiratory: Normal respiratory  effort.  No retractions. Lungs CTAB. Gastrointestinal: Soft and nontender. No distention.  Positive bowel sounds Musculoskeletal: No lower extremity tenderness nor edema.   Neurologic:  Normal speech and language.  Mild left-sided facial droop, mild left pronator drift, approximately 4 out of 5 strength in left upper extremity and left lower extremity.  Sensation at baseline Skin:  Skin is warm, dry and intact.  Psychiatric: Mood and affect are normal.   ____________________________________________   LABS (all labs ordered are listed, but only abnormal results are displayed)  Labs Reviewed  COMPREHENSIVE METABOLIC PANEL - Abnormal; Notable for the following components:      Result Value   Glucose, Bld 116 (*)    BUN 22 (*)    All other components  within normal limits  GLUCOSE, CAPILLARY  PROTIME-INR  APTT  CBC  DIFFERENTIAL  TROPONIN I  ETHANOL  URINE DRUG SCREEN, QUALITATIVE ([facility] ONLY)  CBG MONITORING, ED   ____________________________________________  EKG  ED ECG REPORT I, Loney Hering, the attending physician, personally viewed and interpreted this ECG.   Date: 01/06/2018  EKG Time: 2040  Rate: 67  Rhythm: normal sinus rhythm  Axis: normal  Intervals:none  ST&T Change: none  ____________________________________________  RADIOLOGY  ED MD interpretation:  CT head: No acute intracranial abnormality  MRI: Small acute/early subacute infarction extending from right [place] to caudate body transversing the corona radiata, no hemorrhage or mass-effect, adjacent chronic lacunar infarct in the right basal ganglia.  Official radiology report(s): Ct Head Wo Contrast  Result Date: 01/05/2018 CLINICAL DATA:  Left-sided facial droop. EXAM: CT HEAD WITHOUT CONTRAST TECHNIQUE: Contiguous axial images were obtained from the base of the skull through the vertex without intravenous contrast. COMPARISON:  CT scan of September 12, 2017. FINDINGS: Brain: Stable old right  periventricular white matter infarction is noted. No mass effect or midline shift is noted. Ventricular size is within normal limits. There is no evidence of mass lesion, hemorrhage or acute infarction. Vascular: No hyperdense vessel or unexpected calcification. Skull: Normal. Negative for fracture or focal lesion. Sinuses/Orbits: No acute finding. Other: None. IMPRESSION: No acute intracranial abnormality seen. Electronically Signed   By: Marijo Conception, M.D.   On: 01/05/2018 21:12   Mr Brain Wo Contrast  Result Date: 01/06/2018 CLINICAL DATA:  47 y/o M; left facial droop. Awoke feeling like he was in a fog. Neuro deficit(s), subacute. EXAM: MRI HEAD WITHOUT CONTRAST TECHNIQUE: Multiplanar, multiecho pulse sequences of the brain and surrounding structures were obtained without intravenous contrast. COMPARISON:  01/05/2018 CT head.  09/12/2017 MRI head. FINDINGS: Brain: Small focus of reduced diffusion extending from right putamen to caudate body traversing the corona radiata compatible with acute/early subacute infarction. No associated hemorrhage or mass effect. Just superior to the infarct is a focus of encephalomalacia involving the caudate body and lentiform nucleus from prior infarction as seen on the prior MRI of the brain. No focal mass effect, extra-axial collection, hydrocephalus, or extra-axial collection. Vascular: Normal flow voids. Skull and upper cervical spine: Normal marrow signal. Sinuses/Orbits: Negative. Other: None. IMPRESSION: 1. Small acute/early subacute infarction extending from right putamen to caudate body traversing the corona radiata. No hemorrhage or mass effect. 2. Adjacent chronic lacunar infarct in the right basal ganglia. These results were called by telephone at the time of interpretation on 01/06/2018 at 2:17 am to Dr. Charlesetta Ivory , who verbally acknowledged these results. Electronically Signed   By: Kristine Garbe M.D.   On: 01/06/2018 02:19     ____________________________________________   PROCEDURES  Procedure(s) performed: None  Procedures  Critical Care performed: No  ____________________________________________   INITIAL IMPRESSION / ASSESSMENT AND PLAN / ED COURSE  As part of my medical decision making, I reviewed the following data within the electronic MEDICAL RECORD NUMBER Notes from prior ED visits and [place] Controlled Substance Database   This is a 47 year old male who comes into the hospital today with some left-sided facial droop after having some strokes in the past.  My differential diagnosis includes CVA, Bell's palsy, reemergence syndrome.  We did check some blood work on the patient to include a CBC CMP PT/INR and a CT scan of his head.  The patient's blood work is unremarkable.  I will send the patient for  an MRI looking for possible stroke given his slight weakness of his left upper and lower extremity as well as the facial droop.  The patient's MRI does show an acute or early subacute infarction in the right basal ganglia.  I will give the patient some aspirin.  His NIH stroke scale is approximately 3 at this time.  He will be admitted to the hospitalist service for further evaluation of his symptoms.      ____________________________________________   FINAL CLINICAL IMPRESSION(S) / ED DIAGNOSES  Final diagnoses:  Cerebrovascular accident (CVA), unspecified mechanism [facility])     ED Discharge Orders    None       Note:  This document was prepared using Dragon voice recognition software and may include unintentional dictation errors.    Loney Hering, MD 01/06/18 [phone]

## 2018-01-06 NOTE — Progress Notes (Signed)
    [facility] has been requested to perform a transesophageal echocardiogram on [name] for evaluation of stroke.  After careful review of history and examination, the risks and benefits of transesophageal echocardiogram have been explained including risks of esophageal damage, perforation (1:10,000 risk), bleeding, pharyngeal hematoma as well as other potential complications associated with conscious sedation including aspiration, arrhythmia, respiratory failure and death. Alternatives to treatment were discussed, questions were answered. Patient is willing to proceed.   Christell Faith, PA-C 01/06/2018 12:03 PM

## 2018-01-07 ENCOUNTER — Encounter
Admission: EM | Disposition: A | Discharge status: Home / Self Care | Payer: Self-pay | Source: Home / Self Care | Attending: Internal Medicine

## 2018-01-07 ENCOUNTER — Inpatient Hospital Stay (HOSPITAL_COMMUNITY)
Admit: 2018-01-07 | Discharge: 2018-01-07 | Disposition: A | Discharge status: Home / Self Care | Payer: Managed Care, Other (non HMO) | Attending: Physician Assistant | Admitting: Physician Assistant

## 2018-01-07 ENCOUNTER — Telehealth: Payer: Self-pay

## 2018-01-07 DIAGNOSIS — I6389 Other cerebral infarction: Secondary | ICD-10-CM

## 2018-01-07 HISTORY — PX: TEE WITHOUT CARDIOVERSION: SHX5443

## 2018-01-07 LAB — LIPID PANEL
Cholesterol: 98 mg/dL (ref 0–200)
HDL: 24 mg/dL — ABNORMAL LOW (ref >40)
LDL Cholesterol: 47 mg/dL (ref 0–99)
Total CHOL/HDL Ratio: 4.1 RATIO
Triglycerides: 137 mg/dL (ref <150)
VLDL: 27 mg/dL (ref 0–40)

## 2018-01-07 LAB — PROTEIN C ACTIVITY: Protein C Activity: 94 % (ref 73–180)

## 2018-01-07 LAB — LUPUS ANTICOAGULANT PANEL
DRVVT: 31.6 s (ref 0.0–47.0)
PTT Lupus Anticoagulant: 39.4 s (ref 0.0–51.9)

## 2018-01-07 LAB — PROTEIN S ACTIVITY: Protein S Activity: 71 % (ref 63–140)

## 2018-01-07 LAB — PROTEIN S, TOTAL: Protein S Ag, Total: 64 % (ref 60–150)

## 2018-01-07 LAB — GLUCOSE, CAPILLARY
Glucose-Capillary: 171 mg/dL — ABNORMAL HIGH (ref 65–99)
Glucose-Capillary: 178 mg/dL — ABNORMAL HIGH (ref 65–99)

## 2018-01-07 LAB — HOMOCYSTEINE: Homocysteine: 20.3 umol/L — ABNORMAL HIGH (ref 0.0–15.0)

## 2018-01-07 LAB — HIV ANTIBODY (ROUTINE TESTING W REFLEX): HIV Screen 4th Generation wRfx: NONREACTIVE

## 2018-01-07 SURGERY — ECHOCARDIOGRAM, TRANSESOPHAGEAL
Anesthesia: Moderate Sedation

## 2018-01-07 MED ORDER — SODIUM CHLORIDE FLUSH 0.9 % IV SOLN
INTRAVENOUS | Status: AC
  Filled 2018-01-07: qty 10

## 2018-01-07 MED ORDER — MIDAZOLAM HCL 5 MG/5ML IJ SOLN
INTRAMUSCULAR | Status: AC
  Administered 2018-01-07: 1 mg
  Filled 2018-01-07: qty 5

## 2018-01-07 MED ORDER — MIDAZOLAM HCL 5 MG/5ML IJ SOLN
INTRAMUSCULAR | Status: AC | PRN
  Administered 2018-01-07: 1 mg via INTRAVENOUS
  Administered 2018-01-07: 0.5 mg via INTRAVENOUS
  Administered 2018-01-07: 1 mg via INTRAVENOUS

## 2018-01-07 MED ORDER — FENTANYL CITRATE (PF) 100 MCG/2ML IJ SOLN
INTRAMUSCULAR | Status: AC
  Administered 2018-01-07: 08:00:00
  Filled 2018-01-07: qty 2

## 2018-01-07 MED ORDER — LIDOCAINE VISCOUS HCL 2 % MT SOLN
OROMUCOSAL | Status: AC
  Filled 2018-01-07: qty 15

## 2018-01-07 MED ORDER — ASPIRIN 81 MG PO TBEC: 81.0000 mg | DELAYED_RELEASE_TABLET | Freq: Every day | ORAL | 0 refills | Status: DC

## 2018-01-07 MED ORDER — FENTANYL CITRATE (PF) 100 MCG/2ML IJ SOLN
INTRAMUSCULAR | Status: AC
  Administered 2018-01-07: 08:00:00 via INTRAVENOUS
  Filled 2018-01-07: qty 2

## 2018-01-07 MED ORDER — MIDAZOLAM HCL 2 MG/2ML IJ SOLN
INTRAMUSCULAR | Status: DC | PRN
  Administered 2018-01-07: 1 mg via INTRAVENOUS

## 2018-01-07 MED ORDER — FENTANYL CITRATE (PF) 100 MCG/2ML IJ SOLN
INTRAMUSCULAR | Status: AC | PRN
  Administered 2018-01-07: 50 ug via INTRAVENOUS
  Administered 2018-01-07: 12.5 ug via INTRAVENOUS
  Administered 2018-01-07: 50 ug via INTRAVENOUS

## 2018-01-07 MED ORDER — SODIUM CHLORIDE 0.9 % IV SOLN
INTRAVENOUS | Status: DC
  Administered 2018-01-07: 07:00:00 via INTRAVENOUS

## 2018-01-07 MED ORDER — CLOPIDOGREL BISULFATE 75 MG PO TABS: 75.0000 mg | ORAL_TABLET | Freq: Every day | ORAL | 0 refills | Status: DC

## 2018-01-07 MED ORDER — BUTAMBEN-TETRACAINE-BENZOCAINE 2-2-14 % EX AERO
INHALATION_SPRAY | CUTANEOUS | Status: AC
  Filled 2018-01-07: qty 5

## 2018-01-07 NOTE — Procedures (Signed)
Transesophageal Echocardiogram :  Indication: embolic strokes Requesting/ordering  physician: Dr. Doy Mince  Procedure: Benzocaine spray x2 and 2 mls x 2 of viscous lidocaine were given orally to provide local anesthesia to the oropharynx. The patient was positioned supine on the left side, bite block provided. The patient was moderately sedated with the doses of versed and fentanyl as detailed below.  Using digital technique an omniplane probe was advanced into the distal esophagus without incident.   Moderate sedation: 1. Sedation used:  Versed: 2.5 mg, Fentanyl: 67.5 mg 2. Time administered:  8 Am   Time when patient started recovery: 8:30 am 3. I was face to face during this time  See report in EPIC  for complete details: In brief, transgastric imaging revealed normal LV function with no RWMAs and no mural apical thrombus.  .  Estimated ejection fraction was 55%.  Right sided cardiac chambers were normal with no evidence of pulmonary hypertension.  Imaging of the septum showed no ASD or VSD Bubble study was negative for shunt 2D and color flow confirmed no PFO  The LA was well visualized in orthogonal views.  There was no spontaneous contrast and no thrombus in the LA and LA appendage   The descending thoracic aorta had no  mural aortic debris with no evidence of aneurysmal dilation or disection   Gregory Crane 01/07/2018 8:37 AM

## 2018-01-07 NOTE — Progress Notes (Signed)
Discussed discharge instructions and medications with patient and his wife. IV removed. All questions addressed.  Work note given to patient. Patient transported home via car by his wife.  Clarise Cruz, RN

## 2018-01-07 NOTE — Telephone Encounter (Signed)
Spoke with you about this patient. Needs TCM

## 2018-01-07 NOTE — Telephone Encounter (Signed)
Copied from [place] [id number]. Topic: Inquiry >> Jan 07, 2018  2:59 PM Pricilla Handler wrote: Reason for CRM: Patient called stating that he was just released from the hospital today, and wants to be ssen by Dr. Nicki Reaper ASAP. Patient wants to know if Dr. Nicki Reaper can work him in between today and Friday. Please call patient.       Thank You!!!

## 2018-01-07 NOTE — Progress Notes (Signed)
*  PRELIMINARY RESULTS* Echocardiogram Echocardiogram Transesophageal has been performed.  Sherrie Sport 01/07/2018, 8:37 AM

## 2018-01-07 NOTE — Telephone Encounter (Signed)
Transition Care Management Follow-up Telephone Call  How have you been since you were released from the hospital? Patient says he is doing well has appointment scheduled for 01/15/18 patient ask if this appointment time is ok with PCP. Patient does report still feeling tired and will remain out of work through this week returning on Monday if PCP recommends different will need documentation.   Do you understand why you were in the hospital? yes   Do you understand the discharge instrcutions? yes  Items Reviewed:  Medications reviewed: yes  Allergies reviewed: yes  Dietary changes reviewed: yes  Referrals reviewed: yes   Functional Questionnaire:   Activities of Daily Living (ADLs):   He states they are independent in the following: ambulation, bathing and hygiene, feeding, continence, grooming, toileting and dressing States they require assistance with the following: No assistance needed.   Any transportation issues/concerns?: no   Any patient concerns? no   Confirmed importance and date/time of follow-up visits scheduled: yes   Confirmed with patient if condition begins to worsen call PCP or go to the ER.  Patient was given the Call-a-Nurse line [phone]: yes

## 2018-01-07 NOTE — Discharge Summary (Signed)
Gregory Crane, is a 47 y.o. male  DOB Apr 26, 1971  MRN [id number].  Admission date:  01/06/2018  Admitting Physician  Arta Silence, MD  Discharge Date:  01/07/2018   Primary MD  [name], MD  Recommendations for primary care physician for things to follow:   Follow-up with PCP in 1 week Follow-up with Dr. Alroy Dust Potter,neurologist s a new patient, will schedule appointment for him. Admission Diagnosis  Cerebrovascular accident (CVA), unspecified mechanism ([place]) [I63.9] Acute CVA (cerebrovascular accident) [facility]) [I63.9]   Discharge Diagnosis  Cerebrovascular accident (CVA), unspecified mechanism ([place]) [I63.9] Acute CVA (cerebrovascular accident) ([place]) [I63.9]   Active Problems:   Acute CVA (cerebrovascular accident) [facility])      Past Medical History:  Diagnosis Date  . Allergy   . Bell's palsy   . Diabetes mellitus without complication (HCC)    diet controlled  . Hypertension   . Hypothyroidism   . Kidney stones   . Stroke [facility])     Past Surgical History:  Procedure Laterality Date  . LUMBAR PUNCTURE     as child  . NO PAST SURGERIES         History of present illness and  Hospital Course:     Kindly see H&P for history of present illness and admission details, please review complete Labs, Consult reports and Test reports for all details in brief  HPI  from the history and physical done on the day of admission 47 year old male patient with multiple medical problems of hypertension, diabetes mellitus type 2, previous right basal ganglia stroke in January comes in with left facial droop and admitted for evaluation of recurrent stroke.   Hospital Course  Acute stroke with left-sided facial droop secondary to right putamen, caudate stroke.  Fortunately patient has only left facial droop but no  weakness in hands or legs.  Patient had previous right basal ganglia stroke.  He was taking ASA  325 mg at home.  Seen by Dr. Doy Mince, added Plavix 75 mg daily, change aspirin to 81 mg daily.  Continue his home dose statin, atorvastatin 40 mg daily. 2.  Because of recurrent stroke patient seen by cardiology had a TEE this morning.  Patient TEE did not show any Left ventricular thrombus, EF 55%.  Septal images did not show any ASD or VSD, bubble study negative for shunt, 2D, color-flow conformant no PFO.  The results discussed with Dr. Alexis Goodell.  And work-up for hypercoagulation panel including prothrombin gene mutation, cardiolipin antibodies of IgG, IgM, IgA, 5 Leiden, homocystine levels, beta-2 glycoprotein antibodies, anticoagulant,  protein S, protein C, Antithrombin III.  These results needs to be followed by PCP and also I asked her secretary to make appointment with Dr. Gurney Maxin as a new patient for follow-up of his lab results and also recurrent strokes.  Same explained to the patient. #3 diabetes mellitus type 2: Continue home dose insulin. 4.  Peripheral rhythm: Continue levothyroxine. 5.  Diabetic neuropathy: Patient is on Lyrica 100 mg p.o. 3 times daily. Essential hypertension: Controlled, continue losartan 50 mg p.o. daily.      Discharge Condition: stable   Follow UP  Follow-up Information    [name], MD. Schedule an appointment as soon as possible for a visit in 1 week(s).   Specialty:  Internal Medicine Contact information: [address] [place] [address] [phone]        Anabel Bene, MD. Schedule an appointment as soon as possible for a visit  in 2 week(s).   Specialty:  Neurology Why:  make first available,recurrent stroke,hypercoagulation work up [employer] information: [facility] [place] [address] [phone]             Discharge  Instructions  and  Discharge Medications      Allergies as of 01/07/2018   No Known Allergies     Medication List    STOP taking these medications   aspirin 325 MG tablet Replaced by:  aspirin 81 MG EC tablet   insulin lispro 100 UNIT/ML injection Commonly known as:  HUMALOG     TAKE these medications   aspirin 81 MG EC tablet Take 1 tablet (81 mg total) by mouth daily. Replaces:  aspirin 325 MG tablet   atorvastatin 40 MG tablet Commonly known as:  LIPITOR TAKE 1 TABLET BY MOUTH DAILY AT 6:00 PM   clopidogrel 75 MG tablet Commonly known as:  PLAVIX Take 1 tablet (75 mg total) by mouth daily.   glucose blood test strip Commonly known as:  FREESTYLE LITE Check blood sugars twice a day (Dx. 250.02)   insulin glargine 100 UNIT/ML injection Commonly known as:  LANTUS Inject 0.15 mLs (15 Units total) into the skin at bedtime.   Insulin Syringe-Needle U-100 27G X 1/2" 1 ML Misc Use as directed with insulin. One needle per use.   INSULIN SYRINGE .5CC/29G 29G X 1/2" 0.5 ML Misc USE AS DIRECTED WITH INSULIN, 1 NEEDLE PER USE   levothyroxine 112 MCG tablet Commonly known as:  SYNTHROID Take 1 tablet (112 mcg total) by mouth daily before breakfast.   losartan 50 MG tablet Commonly known as:  COZAAR Take 1 tablet (50 mg total) by mouth daily.   pregabalin 100 MG capsule Commonly known as:  LYRICA Take 1 capsule (100 mg total) by mouth 3 (three) times daily.   LYRICA 100 MG capsule Generic drug:  pregabalin TAKE 1 CAPSULE BY MOUTH THREE TIMES DAILY   metFORMIN 500 MG tablet Commonly known as:  GLUCOPHAGE Take 1 tablet (500 mg total) by mouth daily with breakfast.   ONETOUCH DELICA LANCETS FINE Misc 1 applicator by Other route 4 (four) times daily.         Diet and Activity recommendation: See Discharge Instructions above   Consults obtained -neurology, cardiology  Major procedures and Radiology Reports - PLEASE review detailed and final reports for all  details, in brief -     Ct Angio Head W Or Wo Contrast  Result Date: 01/06/2018 CLINICAL DATA:  47 year old male with left facial droop and altered mental status found to have an acute on chronic lacunar infarct in the right basal ganglia on brain MRI earlier today. EXAM: CT ANGIOGRAPHY HEAD AND NECK TECHNIQUE: Multidetector CT imaging of the head and neck was performed using the standard protocol during bolus administration of intravenous contrast. Multiplanar CT image reconstructions and MIPs were obtained to evaluate the vascular anatomy. Carotid stenosis measurements (when applicable) are obtained utilizing NASCET criteria, using the distal internal carotid diameter as the denominator. CONTRAST:  22mL ISOVUE-370 IOPAMIDOL (ISOVUE-370) INJECTION 76% COMPARISON:  Brain MRI 0136 hours today. Head CT 01/05/2018 and earlier. FINDINGS: CT HEAD Brain: Stable heterogeneous hypodensity in the right basal ganglia since the CT yesterday. No associated hemorrhage or mass effect. Stable hypodensity near the genu of the left internal capsule. Stable and normal gray-white matter differentiation elsewhere. No midline shift, ventriculomegaly, mass effect, evidence of mass lesion, or evidence of new cortically based acute  infarction. Calvarium and skull base: Stable and negative. Paranasal sinuses: Stable and negative. Orbits: Visualized orbits and scalp soft tissues are within normal limits. CTA NECK Skeleton: No acute osseous abnormality identified. Cervical spine degeneration. Upper chest: Upper lung atelectasis. No superior mediastinal lymphadenopathy. Other neck: Subcentimeter hyperdense right thyroid nodule on series 8, image 46 in the posterior right lobe does not meet consensus criteria for ultrasound follow-up. Other neck soft tissues are negative. No lymphadenopathy. Aortic arch: 4 vessel arch artery arises configuration. The directly left vertebral from the arch. No arch atherosclerosis or great vessel origin  stenosis. Right carotid system: Negative right CCA. Minimal soft plaque at the right carotid bifurcation. No stenosis and otherwise negative cervical right ICA. Left carotid system: Minimal soft plaque in the distal left CCA without stenosis. Minimal soft plaque at the left carotid bifurcation. Mild soft plaque in the proximal left ICA including occasional small foci of low-density plaque such as on series 8, image 81. Proximal left ICA irregularity is maximal at the distal bulb. There is no significant stenosis. Vertebral arteries: Normal proximal right subclavian and right vertebral artery origins. The right vertebral artery is dominant, somewhat dolichoectatic, and patent to the skull base without stenosis. The left vertebral artery arises directly from the arch with no origin stenosis. The left vertebral is mildly non dominant although has a normal caliber overall. The left vertebral is patent to the skull base without stenosis. CTA HEAD Posterior circulation: The distal left vertebral artery is patent to the basilar with no atherosclerosis or stenosis. There is mild atherosclerosis of the right vertebral artery V4 segment without significant stenosis (series 12, image 133). Patent vertebrobasilar junction. Both AICAs appear dominant. Mildly dolichoectatic basilar artery without stenosis. Normal SCA and PCA origins (duplicated right SCA, normal variant). Both posterior communicating arteries are present. Left PCA branches are normal. The right PCA is asymmetrically smaller and irregular best seen on series 14, image 20. There is up to moderate stenosis of the right P2 segment, but preserved distal right PCA enhancement. Anterior circulation: The right ICA siphon appears dominant and mildly dolichoectatic with mild calcified plaque. No stenosis. Normal right ophthalmic and posterior communicating artery origins. The left siphon is patent, but there is moderate stenosis of the left ICA petrous segment on series  11, image 128, and additional moderate irregularity and stenosis at the distal cavernous segment on series 13, image 112. The left supraclinoid ICA also appears irregular. The left ophthalmic and posterior communicating artery origins remain normal. Both carotid termini are patent. However, there is severe stenosis of the left ACA origin. The A1 segments appear codominant distally. The anterior communicating artery is normal. Bilateral ACA branches are within normal limits, with a median artery of the corpus callosum (normal variant). Left MCA M1 segment, trifurcation, and left MCA branches are within normal limits. The right MCA M1 segment, bifurcation, and right MCA branches are within normal limits. Venous sinuses: Patent. Anatomic variants: Left vertebral artery arises directly from the arch. Right vertebral artery is dominant. Median artery of the corpus callosum. Delayed phase: No abnormal enhancement identified. Review of the MIP images confirms the above findings IMPRESSION: 1. Negative for large vessel occlusion, and negative for arterial stenosis in the neck. 2. Positive for disproportionate atherosclerosis of the left ICA and tandem hemodynamically significant (at least moderate) stenoses of the Left ICA siphon. Predominantly soft plaque is demonstrated. 3. Positive also for severe stenosis at the left ACA origin, and at least moderate stenosis of the right PCA  P2 segment. 4.  Stable CT appearance of the brain since yesterday. Electronically Signed   By: Genevie Ann M.D.   On: 01/06/2018 12:51   Ct Head Wo Contrast  Result Date: 01/05/2018 CLINICAL DATA:  Left-sided facial droop. EXAM: CT HEAD WITHOUT CONTRAST TECHNIQUE: Contiguous axial images were obtained from the base of the skull through the vertex without intravenous contrast. COMPARISON:  CT scan of September 12, 2017. FINDINGS: Brain: Stable old right periventricular white matter infarction is noted. No mass effect or midline shift is noted.  Ventricular size is within normal limits. There is no evidence of mass lesion, hemorrhage or acute infarction. Vascular: No hyperdense vessel or unexpected calcification. Skull: Normal. Negative for fracture or focal lesion. Sinuses/Orbits: No acute finding. Other: None. IMPRESSION: No acute intracranial abnormality seen. Electronically Signed   By: Marijo Conception, M.D.   On: 01/05/2018 21:12   Ct Angio Neck W Or Wo Contrast  Result Date: 01/06/2018 CLINICAL DATA:  47 year old male with left facial droop and altered mental status found to have an acute on chronic lacunar infarct in the right basal ganglia on brain MRI earlier today. EXAM: CT ANGIOGRAPHY HEAD AND NECK TECHNIQUE: Multidetector CT imaging of the head and neck was performed using the standard protocol during bolus administration of intravenous contrast. Multiplanar CT image reconstructions and MIPs were obtained to evaluate the vascular anatomy. Carotid stenosis measurements (when applicable) are obtained utilizing NASCET criteria, using the distal internal carotid diameter as the denominator. CONTRAST:  74mL ISOVUE-370 IOPAMIDOL (ISOVUE-370) INJECTION 76% COMPARISON:  Brain MRI 0136 hours today. Head CT 01/05/2018 and earlier. FINDINGS: CT HEAD Brain: Stable heterogeneous hypodensity in the right basal ganglia since the CT yesterday. No associated hemorrhage or mass effect. Stable hypodensity near the genu of the left internal capsule. Stable and normal gray-white matter differentiation elsewhere. No midline shift, ventriculomegaly, mass effect, evidence of mass lesion, or evidence of new cortically based acute infarction. Calvarium and skull base: Stable and negative. Paranasal sinuses: Stable and negative. Orbits: Visualized orbits and scalp soft tissues are within normal limits. CTA NECK Skeleton: No acute osseous abnormality identified. Cervical spine degeneration. Upper chest: Upper lung atelectasis. No superior mediastinal lymphadenopathy.  Other neck: Subcentimeter hyperdense right thyroid nodule on series 8, image 46 in the posterior right lobe does not meet consensus criteria for ultrasound follow-up. Other neck soft tissues are negative. No lymphadenopathy. Aortic arch: 4 vessel arch artery arises configuration. The directly left vertebral from the arch. No arch atherosclerosis or great vessel origin stenosis. Right carotid system: Negative right CCA. Minimal soft plaque at the right carotid bifurcation. No stenosis and otherwise negative cervical right ICA. Left carotid system: Minimal soft plaque in the distal left CCA without stenosis. Minimal soft plaque at the left carotid bifurcation. Mild soft plaque in the proximal left ICA including occasional small foci of low-density plaque such as on series 8, image 81. Proximal left ICA irregularity is maximal at the distal bulb. There is no significant stenosis. Vertebral arteries: Normal proximal right subclavian and right vertebral artery origins. The right vertebral artery is dominant, somewhat dolichoectatic, and patent to the skull base without stenosis. The left vertebral artery arises directly from the arch with no origin stenosis. The left vertebral is mildly non dominant although has a normal caliber overall. The left vertebral is patent to the skull base without stenosis. CTA HEAD Posterior circulation: The distal left vertebral artery is patent to the basilar with no atherosclerosis or stenosis. There is mild atherosclerosis  of the right vertebral artery V4 segment without significant stenosis (series 12, image 133). Patent vertebrobasilar junction. Both AICAs appear dominant. Mildly dolichoectatic basilar artery without stenosis. Normal SCA and PCA origins (duplicated right SCA, normal variant). Both posterior communicating arteries are present. Left PCA branches are normal. The right PCA is asymmetrically smaller and irregular best seen on series 14, image 20. There is up to moderate  stenosis of the right P2 segment, but preserved distal right PCA enhancement. Anterior circulation: The right ICA siphon appears dominant and mildly dolichoectatic with mild calcified plaque. No stenosis. Normal right ophthalmic and posterior communicating artery origins. The left siphon is patent, but there is moderate stenosis of the left ICA petrous segment on series 11, image 128, and additional moderate irregularity and stenosis at the distal cavernous segment on series 13, image 112. The left supraclinoid ICA also appears irregular. The left ophthalmic and posterior communicating artery origins remain normal. Both carotid termini are patent. However, there is severe stenosis of the left ACA origin. The A1 segments appear codominant distally. The anterior communicating artery is normal. Bilateral ACA branches are within normal limits, with a median artery of the corpus callosum (normal variant). Left MCA M1 segment, trifurcation, and left MCA branches are within normal limits. The right MCA M1 segment, bifurcation, and right MCA branches are within normal limits. Venous sinuses: Patent. Anatomic variants: Left vertebral artery arises directly from the arch. Right vertebral artery is dominant. Median artery of the corpus callosum. Delayed phase: No abnormal enhancement identified. Review of the MIP images confirms the above findings IMPRESSION: 1. Negative for large vessel occlusion, and negative for arterial stenosis in the neck. 2. Positive for disproportionate atherosclerosis of the left ICA and tandem hemodynamically significant (at least moderate) stenoses of the Left ICA siphon. Predominantly soft plaque is demonstrated. 3. Positive also for severe stenosis at the left ACA origin, and at least moderate stenosis of the right PCA P2 segment. 4.  Stable CT appearance of the brain since yesterday. Electronically Signed   By: Genevie Ann M.D.   On: 01/06/2018 12:51   Mr Brain Wo Contrast  Result Date:  01/06/2018 CLINICAL DATA:  47 y/o M; left facial droop. Awoke feeling like he was in a fog. Neuro deficit(s), subacute. EXAM: MRI HEAD WITHOUT CONTRAST TECHNIQUE: Multiplanar, multiecho pulse sequences of the brain and surrounding structures were obtained without intravenous contrast. COMPARISON:  01/05/2018 CT head.  09/12/2017 MRI head. FINDINGS: Brain: Small focus of reduced diffusion extending from right putamen to caudate body traversing the corona radiata compatible with acute/early subacute infarction. No associated hemorrhage or mass effect. Just superior to the infarct is a focus of encephalomalacia involving the caudate body and lentiform nucleus from prior infarction as seen on the prior MRI of the brain. No focal mass effect, extra-axial collection, hydrocephalus, or extra-axial collection. Vascular: Normal flow voids. Skull and upper cervical spine: Normal marrow signal. Sinuses/Orbits: Negative. Other: None. IMPRESSION: 1. Small acute/early subacute infarction extending from right putamen to caudate body traversing the corona radiata. No hemorrhage or mass effect. 2. Adjacent chronic lacunar infarct in the right basal ganglia. These results were called by telephone at the time of interpretation on 01/06/2018 at 2:17 am to Dr. Charlesetta Ivory , who verbally acknowledged these results. Electronically Signed   By: Kristine Garbe M.D.   On: 01/06/2018 02:19    Micro Results    No results found for this or any previous visit (from the past 240 hour(s)).  Today   Subjective:   Gregory Crane today has no headache,no chest abdominal pain,no new weakness tingling or numbness, feels much better wants to go home today.   Objective:   Blood pressure 135/69, pulse 65, temperature (!) 97.5 F (36.4 C), temperature source Oral, resp. rate 19, height 6' (1.829 m), weight 124.2 kg (273 lb 14.4 oz), SpO2 96 %.  No intake or output data in the 24 hours ending 01/07/18 1124  Exam Awake  Alert, Oriented x 3, No new F.N deficits, Normal affect [place].AT,PERRAL Supple Neck,No JVD, No cervical lymphadenopathy appriciated.  Symmetrical Chest wall movement, Good air movement bilaterally, CTAB RRR,No Gallops,Rubs or new Murmurs, No Parasternal Heave +ve B.Sounds, Abd Soft, Non tender, No organomegaly appriciated, No rebound -guarding or rigidity. No Cyanosis, Clubbing or edema, No new Rash or bruise  Data Review   CBC w Diff:  Lab Results  Component Value Date   WBC 5.9 01/05/2018   HGB 14.4 01/05/2018   HGB 16.3 03/25/2017   HCT 41.8 01/05/2018   HCT 47.8 03/25/2017   PLT 228 01/05/2018   PLT 213 03/25/2017   LYMPHOPCT 35 01/05/2018   LYMPHOPCT 25.4 01/26/2014   MONOPCT 8 01/05/2018   MONOPCT 5.9 01/26/2014   EOSPCT 2 01/05/2018   EOSPCT 0.8 01/26/2014   BASOPCT 1 01/05/2018   BASOPCT 0.3 01/26/2014    CMP:  Lab Results  Component Value Date   NA 138 01/05/2018   NA 141 03/25/2017   NA 136 01/26/2014   K 4.0 01/05/2018   K 4.0 01/26/2014   CL 105 01/05/2018   CL 102 01/26/2014   CO2 28 01/05/2018   CO2 27 01/26/2014   BUN 22 (H) 01/05/2018   BUN 17 03/25/2017   BUN 9 01/26/2014   CREATININE 1.18 01/05/2018   CREATININE 0.76 01/26/2014   PROT 7.5 01/05/2018   PROT 6.7 03/25/2017   ALBUMIN 4.0 01/05/2018   ALBUMIN 3.8 03/25/2017   BILITOT 0.6 01/05/2018   BILITOT 0.3 03/25/2017   ALKPHOS 78 01/05/2018   AST 27 01/05/2018   ALT 24 01/05/2018  .  Discussed with Dr. Alexis Goodell about the discharge plan, and explained to the patient.  Total Time in preparing paper work, data evaluation and todays exam - 35 minutes  Epifanio Lesches M.D on 01/07/2018 at 11:24 AM    Note: This dictation was prepared with Dragon dictation along with smaller phrase technology. Any transcriptional errors that result from this process are unintentional.

## 2018-01-08 ENCOUNTER — Encounter: Payer: Self-pay | Admitting: Cardiovascular Disease

## 2018-01-08 LAB — BETA-2-GLYCOPROTEIN I ABS, IGG/M/A
Beta-2 Glyco I IgG: <9 GPI IgG units (ref 0–20)
Beta-2-Glycoprotein I IgA: <9 GPI IgA units (ref 0–25)
Beta-2-Glycoprotein I IgM: <9 GPI IgM units (ref 0–32)

## 2018-01-08 NOTE — Telephone Encounter (Signed)
Patient is aware PCP will be out of Office and is good with 01/15/18 appointment.

## 2018-01-08 NOTE — Telephone Encounter (Signed)
Reviewed note.  Please let him know that I will be out of the office end of this week and beginning of next week.  I will not be back in the office next week until 01/13/18.  Does he need to be worked in before 01/15/18. (the earliest I could do would be 01/13/18 - when I return).  If this is what he needs then I will work in on 01/13/18.  Thanks

## 2018-01-09 LAB — PROTEIN C, TOTAL: Protein C, Total: 79 % (ref 60–150)

## 2018-01-09 LAB — CARDIOLIPIN ANTIBODIES, IGG, IGM, IGA
Anticardiolipin IgA: <9 APL U/mL (ref 0–11)
Anticardiolipin IgG: <9 GPL U/mL (ref 0–14)
Anticardiolipin IgM: <9 MPL U/mL (ref 0–12)

## 2018-01-12 LAB — PROTHROMBIN GENE MUTATION

## 2018-01-12 LAB — FACTOR 5 LEIDEN

## 2018-01-15 ENCOUNTER — Encounter: Payer: Self-pay | Admitting: Internal Medicine

## 2018-01-15 ENCOUNTER — Ambulatory Visit: Payer: Managed Care, Other (non HMO) | Admitting: Internal Medicine

## 2018-01-15 ENCOUNTER — Other Ambulatory Visit: Payer: Self-pay | Admitting: Internal Medicine

## 2018-01-15 DIAGNOSIS — E039 Hypothyroidism, unspecified: Secondary | ICD-10-CM

## 2018-01-15 DIAGNOSIS — I69392 Facial weakness following cerebral infarction: Secondary | ICD-10-CM

## 2018-01-15 DIAGNOSIS — E11319 Type 2 diabetes mellitus with unspecified diabetic retinopathy without macular edema: Secondary | ICD-10-CM | POA: Diagnosis not present

## 2018-01-15 DIAGNOSIS — I10 Essential (primary) hypertension: Secondary | ICD-10-CM

## 2018-01-15 DIAGNOSIS — E785 Hyperlipidemia, unspecified: Secondary | ICD-10-CM | POA: Diagnosis not present

## 2018-01-15 DIAGNOSIS — I639 Cerebral infarction, unspecified: Secondary | ICD-10-CM

## 2018-01-15 NOTE — Progress Notes (Signed)
Patient ID: Gregory Crane, male   DOB: [birth year], 47 y.o.   MRN: [id number]   Subjective:    Patient ID: Gregory Crane, male    DOB: [birth year], 47 y.o.   MRN: [id number]  HPI  Patient here for hospital follow up.  Was admitted 01/06/18 with acute left sided facial droop secondary to right putamen, caudate stroke.  Was evaluated by Dr Doy Mince while in the hospital.  plavix added to 2m aspirin.  Continues on lipitor.  Was evaluated by cardiology.  S/p TEE.  No thrombus.  EF 55%.  Has f/u planned with neurology.  Also has planned f/u with vascular surgery for changes found on CTA of neck.  Since discharge from the hospital, he has done relatively well.  Facial droop has essentially resolved.  No headache.  No dizziness.  No chest pain or sob.  Does report fatigue.  Has tried to go back to work.  Able to do 1/2 day and then unable to return the next day - secondary to fatigue.  Discussed graduated work schedule.  States blood sugars doing well.  AM sugars averaging 130-150 and PM sugars averaging 100-130.  No low sugars.     Past Medical History:  Diagnosis Date  . Allergy   . Bell's palsy   . Diabetes mellitus without complication (HCC)    diet controlled  . Hypertension   . Hypothyroidism   . Kidney stones   . Stroke ([facility]    Past Surgical History:  Procedure Laterality Date  . LUMBAR PUNCTURE     as child  . NO PAST SURGERIES    . TEE WITHOUT CARDIOVERSION N/A 01/07/2018   Procedure: TRANSESOPHAGEAL ECHOCARDIOGRAM (TEE);  Surgeon: GMinna Merritts MD;  Location: [facility] ORS;  Service: Cardiovascular;  Laterality: N/A;   Family History  Problem Relation Age of Onset  . Breast cancer Mother   . Diabetes Father   . Diabetes Sister   . Arthritis Maternal Grandmother   . Diabetes Maternal Grandmother    Social History   Socioeconomic History  . Marital status: Married    Spouse name: SLarene Beach . Number of children: 2  . Years of education: Not on file  . Highest education  level: Not on file  Occupational History  . Not on file  Social Needs  . Financial resource strain: Not hard at all  . Food insecurity:    Worry: Never true    Inability: Never true  . Transportation needs:    Medical: No    Non-medical: No  Tobacco Use  . Smoking status: Never Smoker  . Smokeless tobacco: Never Used  Substance and Sexual Activity  . Alcohol use: No    Alcohol/week: 0.0 oz    Comment: very rarely (1-2/year)  . Drug use: No  . Sexual activity: Yes  Lifestyle  . Physical activity:    Days per week: 0 days    Minutes per session: 0 min  . Stress: To some extent  Relationships  . Social connections:    Talks on phone: More than three times a week    Gets together: More than three times a week    Attends religious service: More than 4 times per year    Active member of club or organization: No    Attends meetings of clubs or organizations: Never    Relationship status: Married  Other Topics Concern  . Not on file  Social History Narrative   Lives at home with wife,  independent at baseline.    Outpatient Encounter Medications as of 01/15/2018  Medication Sig  . aspirin EC 81 MG EC tablet Take 1 tablet (81 mg total) by mouth daily.  Marland Kitchen atorvastatin (LIPITOR) 40 MG tablet TAKE 1 TABLET BY MOUTH DAILY AT 6:00 PM  . clopidogrel (PLAVIX) 75 MG tablet Take 1 tablet (75 mg total) by mouth daily.  . DULoxetine (CYMBALTA) 20 MG capsule   . glucose blood (FREESTYLE LITE) test strip Check blood sugars twice a day (Dx. 250.02)  . insulin lispro (HUMALOG) 100 UNIT/ML injection Humalog U-100 Insulin 100 unit/mL subcutaneous solution  . INSULIN SYRINGE .5CC/29G 29G X 1/2" 0.5 ML MISC USE AS DIRECTED WITH INSULIN, 1 NEEDLE PER USE  . Insulin Syringe-Needle U-100 27G X 1/2" 1 ML MISC Use as directed with insulin. One needle per use.  Marland Kitchen LANTUS 100 UNIT/ML injection INJECT 0.15 ML(15 UNITS) INTO THE SKIN EVERY NIGHT AT BEDTIME  . levothyroxine (SYNTHROID) 112 MCG tablet Take 1  tablet (112 mcg total) by mouth daily before breakfast.  . losartan (COZAAR) 50 MG tablet Take 1 tablet (50 mg total) by mouth daily.  Marland Kitchen LYRICA 100 MG capsule TAKE 1 CAPSULE BY MOUTH THREE TIMES DAILY  . metFORMIN (GLUCOPHAGE) 500 MG tablet Take 1 tablet (500 mg total) by mouth daily with breakfast.  . ONETOUCH DELICA LANCETS FINE MISC 1 applicator by Other route 4 (four) times daily.  . pregabalin (LYRICA) 100 MG capsule Take 1 capsule (100 mg total) by mouth 3 (three) times daily.   No facility-administered encounter medications on file as of 01/15/2018.     Review of Systems  Constitutional: Negative for appetite change and unexpected weight change.  HENT: Negative for congestion and sinus pressure.   Respiratory: Negative for cough, chest tightness and shortness of breath.   Cardiovascular: Negative for chest pain, palpitations and leg swelling.  Gastrointestinal: Negative for abdominal pain, diarrhea, nausea and vomiting.  Genitourinary: Negative for difficulty urinating and dysuria.  Musculoskeletal: Negative for joint swelling and myalgias.  Skin: Negative for color change and rash.  Neurological: Negative for dizziness, light-headedness and headaches.  Psychiatric/Behavioral: Negative for agitation and dysphoric mood.       Objective:    Physical Exam  Constitutional: He appears well-developed and well-nourished. No distress.  HENT:  Nose: Nose normal.  Mouth/Throat: Oropharynx is clear and moist.  Neck: Neck supple. No thyromegaly present.  Cardiovascular: Normal rate and regular rhythm.  Pulmonary/Chest: Effort normal and breath sounds normal. No respiratory distress.  Abdominal: Soft. Bowel sounds are normal. There is no tenderness.  Musculoskeletal: He exhibits no edema or tenderness.  Lymphadenopathy:    He has no cervical adenopathy.  Skin: No erythema. No pallor.  Psychiatric: He has a normal mood and affect. His behavior is normal.    BP 130/78 (BP Location:  Left Arm, Patient Position: Sitting, Cuff Size: Large)   Pulse 76   Temp 97.6 F (36.4 C) (Oral)   Resp 16   Wt 276 lb 9.6 oz (125.5 kg)   SpO2 97%   BMI 37.51 kg/m  Wt Readings from Last 3 Encounters:  01/15/18 276 lb 9.6 oz (125.5 kg)  01/06/18 273 lb 14.4 oz (124.2 kg)  10/24/17 271 lb 9.6 oz (123.2 kg)     Lab Results  Component Value Date   WBC 5.9 01/05/2018   HGB 14.4 01/05/2018   HCT 41.8 01/05/2018   PLT 228 01/05/2018   GLUCOSE 116 (H) 01/05/2018   CHOL 98 01/07/2018  TRIG 137 01/07/2018   HDL 24 (L) 01/07/2018   LDLCALC 47 01/07/2018   ALT 24 01/05/2018   AST 27 01/05/2018   NA 138 01/05/2018   K 4.0 01/05/2018   CL 105 01/05/2018   CREATININE 1.18 01/05/2018   BUN 22 (H) 01/05/2018   CO2 28 01/05/2018   TSH 7.970 (H) 03/25/2017   INR 0.98 01/05/2018   HGBA1C 6.4 (H) 01/06/2018       Assessment & Plan:   Problem List Items Addressed This Visit    Acute CVA (cerebrovascular accident) [facility])    Recently admitted with left facial droop and found to have acute CVA.  On plavix.  Continue lipitor.  Scheduled f/u with neurology.  Continue risk factor modification.  Increased fatigue.  Graduated work schedule written out.  Follow.        Diabetes ([place])    Sugars as outlined.  Doing well.  No low sugars.  Schedule f/u met b and a1c.  Keep up to date with eye checks.        Relevant Medications   insulin lispro (HUMALOG) 100 UNIT/ML injection   Essential hypertension    Blood pressure under good control.  Continue same medication regimen.  Follow pressures.  Follow metabolic panel.        Hyperlipidemia    On lipitor.  Low cholesterol diet and exercise.  Follow lipid panel and liver function tests.        Hypothyroidism    On thyroid replacement.  Follow tsh.            [name], MD

## 2018-01-16 NOTE — Telephone Encounter (Signed)
Form signed and placed on your desk. Please notify pt.

## 2018-01-16 NOTE — Telephone Encounter (Signed)
Handicap form placed up front for pick up. Patient is aware.

## 2018-01-18 ENCOUNTER — Encounter: Payer: Self-pay | Admitting: Internal Medicine

## 2018-01-18 NOTE — Assessment & Plan Note (Signed)
Sugars as outlined.  Doing well.  No low sugars.  Schedule f/u met b and a1c.  Keep up to date with eye checks.

## 2018-01-18 NOTE — Assessment & Plan Note (Signed)
On lipitor.  Low cholesterol diet and exercise.  Follow lipid panel and liver function tests.   

## 2018-01-18 NOTE — Assessment & Plan Note (Signed)
Recently admitted with left facial droop and found to have acute CVA.  On plavix.  Continue lipitor.  Scheduled f/u with neurology.  Continue risk factor modification.  Increased fatigue.  Graduated work schedule written out.  Follow.

## 2018-01-18 NOTE — Assessment & Plan Note (Signed)
On thyroid replacement.  Follow tsh.  

## 2018-01-18 NOTE — Assessment & Plan Note (Signed)
Blood pressure under good control.  Continue same medication regimen.  Follow pressures.  Follow metabolic panel.   

## 2018-01-20 ENCOUNTER — Other Ambulatory Visit: Payer: Self-pay | Admitting: Internal Medicine

## 2018-01-22 ENCOUNTER — Encounter: Payer: Self-pay | Admitting: Internal Medicine

## 2018-01-22 ENCOUNTER — Ambulatory Visit (INDEPENDENT_AMBULATORY_CARE_PROVIDER_SITE_OTHER): Payer: Managed Care, Other (non HMO) | Admitting: Internal Medicine

## 2018-01-22 VITALS — BP 120/80 | HR 75 | Ht 72.0 in | Wt 272.5 lb

## 2018-01-22 DIAGNOSIS — I6389 Other cerebral infarction: Secondary | ICD-10-CM | POA: Diagnosis not present

## 2018-01-22 MED ORDER — ATORVASTATIN CALCIUM 80 MG PO TABS: 80.0000 mg | ORAL_TABLET | Freq: Every day | ORAL | 3 refills | Status: DC

## 2018-01-22 MED ORDER — ATORVASTATIN CALCIUM 80 MG PO TABS: 80.0000 mg | ORAL_TABLET | Freq: Every day | ORAL | Status: DC

## 2018-01-22 NOTE — Patient Instructions (Addendum)
Medication Instructions: - Your physician has recommended you make the following change in your medication:   1) INCREASE lipitor (atorvastatin) to 80 mg- take 1 tablet (80 mg) by mouth once daily  Labwork: - none ordered  Procedures/Testing: - Your physician has recommended that have a loop recorder (LINQ) implant  - Please arrive at the [facility] entrance of the hospital, 1st desk on the right, Tuesday 01/27/18 at 7:30 am - You may take all of your regular medications the morning of the procedure - You may eat a light breakfast that morning - Please wash your chest and neck area with an antibacterial soap (any brand) the night before and morning of your procedure   Follow-Up: - Your physician recommends that you schedule a follow-up appointment: Thursday 02/05/18 @ 9:00 am with Dr. Caryl Comes (wound check only)   Any Additional Special Instructions Will Be Listed Below (If Applicable).     If you need a refill on your cardiac medications before your next appointment, please call your pharmacy.

## 2018-01-22 NOTE — H&P (View-Only) (Signed)
ELECTROPHYSIOLOGY CONSULT NOTE  Patient ID: Gregory Crane MRN: [id number], DOB/AGE: 09-07-1970   Admit date: (Not on file) Date of Consult: 01/22/2018  Primary Physician: [name], MD Primary Cardiologist: CE Reason for Consultation: Cryptogenic stroke; recommendations regarding Implantable Loop Recorder  History of Present Illness  EP has been asked to evaluate Gregory Crane for placement of an implantable loop recorder to monitor for atrial fibrillation by Dr End.  The patient was admitted on 01/05/18 with fogginess and L facial droop.  They first developed symptoms while at work.  Imaging demonstrated  Severe stenosis of L ACA.  MRI >> acute putamen infarct -- he has undergone workup for stroke including echocardiogram and carotid dopplers.  The patient has been monitored on telemetry which has demonstrated sinus rhythm with no arrhythmias.  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  Lab work is reviewed.  Prior to admission, the patient denies chest pain, shortness of breath, dizziness, palpitations, or syncope.  They are no significant residual from his strokes  .     Past Medical History:  Diagnosis Date  . Allergy   . Bell's palsy   . Diabetes mellitus without complication (HCC)    diet controlled  . Hypertension   . Hypothyroidism   . Kidney stones   . Stroke [facility])      Surgical History:  Past Surgical History:  Procedure Laterality Date  . LUMBAR PUNCTURE     as child  . NO PAST SURGERIES    . TEE WITHOUT CARDIOVERSION N/A 01/07/2018   Procedure: TRANSESOPHAGEAL ECHOCARDIOGRAM (TEE);  Surgeon: Minna Merritts, MD;  Location: [facility] ORS;  Service: Cardiovascular;  Laterality: N/A;      (Not in a hospital admission)  Inpatient Medications:   Allergies: No Known Allergies  Social History   Socioeconomic  History  . Marital status: Married    Spouse name: Gregory Crane  . Number of children: 2  . Years of education: Not on file  . Highest education level: Not on file  Occupational History  . Not on file  Social Needs  . Financial resource strain: Not hard at all  . Food insecurity:    Worry: Never true    Inability: Never true  . Transportation needs:    Medical: No    Non-medical: No  Tobacco Use  . Smoking status: Never Smoker  . Smokeless tobacco: Never Used  Substance and Sexual Activity  . Alcohol use: No    Alcohol/week: 0.0 oz    Comment: very rarely (1-2/year)  . Drug use: No  . Sexual activity: Yes  Lifestyle  . Physical activity:    Days per week: 0 days    Minutes per session: 0 min  . Stress: To some extent  Relationships  . Social connections:    Talks on phone: More than three times a week    Gets together: More than three times a week    Attends religious service: More than 4 times per year    Active member of club or organization: No    Attends meetings of clubs or organizations: Never    Relationship status: Married  . Intimate partner violence:    Fear of current or ex partner: Patient refused    Emotionally abused: Patient refused    Physically abused: Patient refused  Forced sexual activity: Patient refused  Other Topics Concern  . Not on file  Social History Narrative   Lives at home with wife, independent at baseline.     Family History  Problem Relation Age of Onset  . Breast cancer Mother   . Diabetes Father   . Diabetes Sister   . Arthritis Maternal Grandmother   . Diabetes Maternal Grandmother       Review of Systems: All other systems reviewed and are otherwise negative except as noted above.  Physical Exam: Vitals:   01/22/18 1007  BP: 120/80  Pulse: 75  Weight: 272 lb 8 oz (123.6 kg)  Height: 6' (1.829 m)  Alert and oriented in no acute distress HENT- normal Eyes- EOMI, without scleral icterus Skin- warm and dry; without  rashes LN-neg Neck- supple without thyromegaly, JVP-flat, carotids brisk and full without bruits Back-without CVAT or kyphosis Lungs-clear to auscultation CV-Regular rate and rhythm, nl S1 and S2, no murmurs gallops or rubs, S4-absent Abd-soft with active bowel sounds; no midline pulsation or hepatomegaly Pulses-intact femoral and distal MKS-without gross deformity Neuro- Ax O, CN3-12 intact, grossly normal motor and sensory function Affect engaging   Labs:   Lab Results  Component Value Date   WBC 5.9 01/05/2018   HGB 14.4 01/05/2018   HCT 41.8 01/05/2018   MCV 85.6 01/05/2018   PLT 228 01/05/2018   No results for input(s): NA, K, CL, CO2, BUN, CREATININE, CALCIUM, PROT, BILITOT, ALKPHOS, ALT, AST, GLUCOSE in the last 168 hours.  Invalid input(s): LABALBU   Radiology/Studies: Ct Angio Head W Or Wo Contrast  Result Date: 01/06/2018 CLINICAL DATA:  47 year old male with left facial droop and altered mental status found to have an acute on chronic lacunar infarct in the right basal ganglia on brain MRI earlier today. EXAM: CT ANGIOGRAPHY HEAD AND NECK TECHNIQUE: Multidetector CT imaging of the head and neck was performed using the standard protocol during bolus administration of intravenous contrast. Multiplanar CT image reconstructions and MIPs were obtained to evaluate the vascular anatomy. Carotid stenosis measurements (when applicable) are obtained utilizing NASCET criteria, using the distal internal carotid diameter as the denominator. CONTRAST:  34mL ISOVUE-370 IOPAMIDOL (ISOVUE-370) INJECTION 76% COMPARISON:  Brain MRI 0136 hours today. Head CT 01/05/2018 and earlier. FINDINGS: CT HEAD Brain: Stable heterogeneous hypodensity in the right basal ganglia since the CT yesterday. No associated hemorrhage or mass effect. Stable hypodensity near the genu of the left internal capsule. Stable and normal gray-white matter differentiation elsewhere. No midline shift, ventriculomegaly, mass  effect, evidence of mass lesion, or evidence of new cortically based acute infarction. Calvarium and skull base: Stable and negative. Paranasal sinuses: Stable and negative. Orbits: Visualized orbits and scalp soft tissues are within normal limits. CTA NECK Skeleton: No acute osseous abnormality identified. Cervical spine degeneration. Upper chest: Upper lung atelectasis. No superior mediastinal lymphadenopathy. Other neck: Subcentimeter hyperdense right thyroid nodule on series 8, image 46 in the posterior right lobe does not meet consensus criteria for ultrasound follow-up. Other neck soft tissues are negative. No lymphadenopathy. Aortic arch: 4 vessel arch artery arises configuration. The directly left vertebral from the arch. No arch atherosclerosis or great vessel origin stenosis. Right carotid system: Negative right CCA. Minimal soft plaque at the right carotid bifurcation. No stenosis and otherwise negative cervical right ICA. Left carotid system: Minimal soft plaque in the distal left CCA without stenosis. Minimal soft plaque at the left carotid bifurcation. Mild soft plaque in the proximal left ICA including occasional small  foci of low-density plaque such as on series 8, image 81. Proximal left ICA irregularity is maximal at the distal bulb. There is no significant stenosis. Vertebral arteries: Normal proximal right subclavian and right vertebral artery origins. The right vertebral artery is dominant, somewhat dolichoectatic, and patent to the skull base without stenosis. The left vertebral artery arises directly from the arch with no origin stenosis. The left vertebral is mildly non dominant although has a normal caliber overall. The left vertebral is patent to the skull base without stenosis. CTA HEAD Posterior circulation: The distal left vertebral artery is patent to the basilar with no atherosclerosis or stenosis. There is mild atherosclerosis of the right vertebral artery V4 segment without  significant stenosis (series 12, image 133). Patent vertebrobasilar junction. Both AICAs appear dominant. Mildly dolichoectatic basilar artery without stenosis. Normal SCA and PCA origins (duplicated right SCA, normal variant). Both posterior communicating arteries are present. Left PCA branches are normal. The right PCA is asymmetrically smaller and irregular best seen on series 14, image 20. There is up to moderate stenosis of the right P2 segment, but preserved distal right PCA enhancement. Anterior circulation: The right ICA siphon appears dominant and mildly dolichoectatic with mild calcified plaque. No stenosis. Normal right ophthalmic and posterior communicating artery origins. The left siphon is patent, but there is moderate stenosis of the left ICA petrous segment on series 11, image 128, and additional moderate irregularity and stenosis at the distal cavernous segment on series 13, image 112. The left supraclinoid ICA also appears irregular. The left ophthalmic and posterior communicating artery origins remain normal. Both carotid termini are patent. However, there is severe stenosis of the left ACA origin. The A1 segments appear codominant distally. The anterior communicating artery is normal. Bilateral ACA branches are within normal limits, with a median artery of the corpus callosum (normal variant). Left MCA M1 segment, trifurcation, and left MCA branches are within normal limits. The right MCA M1 segment, bifurcation, and right MCA branches are within normal limits. Venous sinuses: Patent. Anatomic variants: Left vertebral artery arises directly from the arch. Right vertebral artery is dominant. Median artery of the corpus callosum. Delayed phase: No abnormal enhancement identified. Review of the MIP images confirms the above findings IMPRESSION: 1. Negative for large vessel occlusion, and negative for arterial stenosis in the neck. 2. Positive for disproportionate atherosclerosis of the left ICA and  tandem hemodynamically significant (at least moderate) stenoses of the Left ICA siphon. Predominantly soft plaque is demonstrated. 3. Positive also for severe stenosis at the left ACA origin, and at least moderate stenosis of the right PCA P2 segment. 4.  Stable CT appearance of the brain since yesterday. Electronically Signed   By: Genevie Ann M.D.   On: 01/06/2018 12:51   Ct Head Wo Contrast  Result Date: 01/05/2018 CLINICAL DATA:  Left-sided facial droop. EXAM: CT HEAD WITHOUT CONTRAST TECHNIQUE: Contiguous axial images were obtained from the base of the skull through the vertex without intravenous contrast. COMPARISON:  CT scan of September 12, 2017. FINDINGS: Brain: Stable old right periventricular white matter infarction is noted. No mass effect or midline shift is noted. Ventricular size is within normal limits. There is no evidence of mass lesion, hemorrhage or acute infarction. Vascular: No hyperdense vessel or unexpected calcification. Skull: Normal. Negative for fracture or focal lesion. Sinuses/Orbits: No acute finding. Other: None. IMPRESSION: No acute intracranial abnormality seen. Electronically Signed   By: Marijo Conception, M.D.   On: 01/05/2018 21:12   Ct  Angio Neck W Or Wo Contrast  Result Date: 01/06/2018 CLINICAL DATA:  47 year old male with left facial droop and altered mental status found to have an acute on chronic lacunar infarct in the right basal ganglia on brain MRI earlier today. EXAM: CT ANGIOGRAPHY HEAD AND NECK TECHNIQUE: Multidetector CT imaging of the head and neck was performed using the standard protocol during bolus administration of intravenous contrast. Multiplanar CT image reconstructions and MIPs were obtained to evaluate the vascular anatomy. Carotid stenosis measurements (when applicable) are obtained utilizing NASCET criteria, using the distal internal carotid diameter as the denominator. CONTRAST:  65mL ISOVUE-370 IOPAMIDOL (ISOVUE-370) INJECTION 76% COMPARISON:  Brain  MRI 0136 hours today. Head CT 01/05/2018 and earlier. FINDINGS: CT HEAD Brain: Stable heterogeneous hypodensity in the right basal ganglia since the CT yesterday. No associated hemorrhage or mass effect. Stable hypodensity near the genu of the left internal capsule. Stable and normal gray-white matter differentiation elsewhere. No midline shift, ventriculomegaly, mass effect, evidence of mass lesion, or evidence of new cortically based acute infarction. Calvarium and skull base: Stable and negative. Paranasal sinuses: Stable and negative. Orbits: Visualized orbits and scalp soft tissues are within normal limits. CTA NECK Skeleton: No acute osseous abnormality identified. Cervical spine degeneration. Upper chest: Upper lung atelectasis. No superior mediastinal lymphadenopathy. Other neck: Subcentimeter hyperdense right thyroid nodule on series 8, image 46 in the posterior right lobe does not meet consensus criteria for ultrasound follow-up. Other neck soft tissues are negative. No lymphadenopathy. Aortic arch: 4 vessel arch artery arises configuration. The directly left vertebral from the arch. No arch atherosclerosis or great vessel origin stenosis. Right carotid system: Negative right CCA. Minimal soft plaque at the right carotid bifurcation. No stenosis and otherwise negative cervical right ICA. Left carotid system: Minimal soft plaque in the distal left CCA without stenosis. Minimal soft plaque at the left carotid bifurcation. Mild soft plaque in the proximal left ICA including occasional small foci of low-density plaque such as on series 8, image 81. Proximal left ICA irregularity is maximal at the distal bulb. There is no significant stenosis. Vertebral arteries: Normal proximal right subclavian and right vertebral artery origins. The right vertebral artery is dominant, somewhat dolichoectatic, and patent to the skull base without stenosis. The left vertebral artery arises directly from the arch with no origin  stenosis. The left vertebral is mildly non dominant although has a normal caliber overall. The left vertebral is patent to the skull base without stenosis. CTA HEAD Posterior circulation: The distal left vertebral artery is patent to the basilar with no atherosclerosis or stenosis. There is mild atherosclerosis of the right vertebral artery V4 segment without significant stenosis (series 12, image 133). Patent vertebrobasilar junction. Both AICAs appear dominant. Mildly dolichoectatic basilar artery without stenosis. Normal SCA and PCA origins (duplicated right SCA, normal variant). Both posterior communicating arteries are present. Left PCA branches are normal. The right PCA is asymmetrically smaller and irregular best seen on series 14, image 20. There is up to moderate stenosis of the right P2 segment, but preserved distal right PCA enhancement. Anterior circulation: The right ICA siphon appears dominant and mildly dolichoectatic with mild calcified plaque. No stenosis. Normal right ophthalmic and posterior communicating artery origins. The left siphon is patent, but there is moderate stenosis of the left ICA petrous segment on series 11, image 128, and additional moderate irregularity and stenosis at the distal cavernous segment on series 13, image 112. The left supraclinoid ICA also appears irregular. The left ophthalmic and posterior  communicating artery origins remain normal. Both carotid termini are patent. However, there is severe stenosis of the left ACA origin. The A1 segments appear codominant distally. The anterior communicating artery is normal. Bilateral ACA branches are within normal limits, with a median artery of the corpus callosum (normal variant). Left MCA M1 segment, trifurcation, and left MCA branches are within normal limits. The right MCA M1 segment, bifurcation, and right MCA branches are within normal limits. Venous sinuses: Patent. Anatomic variants: Left vertebral artery arises directly  from the arch. Right vertebral artery is dominant. Median artery of the corpus callosum. Delayed phase: No abnormal enhancement identified. Review of the MIP images confirms the above findings IMPRESSION: 1. Negative for large vessel occlusion, and negative for arterial stenosis in the neck. 2. Positive for disproportionate atherosclerosis of the left ICA and tandem hemodynamically significant (at least moderate) stenoses of the Left ICA siphon. Predominantly soft plaque is demonstrated. 3. Positive also for severe stenosis at the left ACA origin, and at least moderate stenosis of the right PCA P2 segment. 4.  Stable CT appearance of the brain since yesterday. Electronically Signed   By: Genevie Ann M.D.   On: 01/06/2018 12:51   Mr Brain Wo Contrast  Result Date: 01/06/2018 CLINICAL DATA:  47 y/o M; left facial droop. Awoke feeling like he was in a fog. Neuro deficit(s), subacute. EXAM: MRI HEAD WITHOUT CONTRAST TECHNIQUE: Multiplanar, multiecho pulse sequences of the brain and surrounding structures were obtained without intravenous contrast. COMPARISON:  01/05/2018 CT head.  09/12/2017 MRI head. FINDINGS: Brain: Small focus of reduced diffusion extending from right putamen to caudate body traversing the corona radiata compatible with acute/early subacute infarction. No associated hemorrhage or mass effect. Just superior to the infarct is a focus of encephalomalacia involving the caudate body and lentiform nucleus from prior infarction as seen on the prior MRI of the brain. No focal mass effect, extra-axial collection, hydrocephalus, or extra-axial collection. Vascular: Normal flow voids. Skull and upper cervical spine: Normal marrow signal. Sinuses/Orbits: Negative. Other: None. IMPRESSION: 1. Small acute/early subacute infarction extending from right putamen to caudate body traversing the corona radiata. No hemorrhage or mass effect. 2. Adjacent chronic lacunar infarct in the right basal ganglia. These results  were called by telephone at the time of interpretation on 01/06/2018 at 2:17 am to Dr. Charlesetta Ivory , who verbally acknowledged these results. Electronically Signed   By: Kristine Garbe M.D.   On: 01/06/2018 02:19    12-lead ECG sinus 75 13/08/35  (personally reviewed) All prior EKG's in EPIC reviewed with no documented atrial fibrillation  Telemetry no afib (personally reviewed)  Assessment and Plan:  Recurrent strokes--Basal Ganglia/Putamel  DM  Cerebrovascular Disease  HTN  Difficult situation-- recurrent strokes, suggestive of small vessel disease.  They do not seem to be likely embolic.  After discussions however with Dr. Doy Mince, given the recurrent strokes, she thinks that the exclusion of atrial fibrillation is important to even though it is relatively low likelihood.  I think the situation is further complicated by the lack of data regarding the appropriate response to SCAF in the setting of recurrent strokes.  It is further challenged by the degree of cerebrovascular disease.  In this regard, risk factor modification is essential; we will increase his Lipitor from 40--80.  I told him also that when the 3 months of DAPT  is over would go along with Plavix rather than aspirin.  BP is reasonably controlled   More than 50% of 85 min  was spent in counseling related to the above    Virl Axe, MD 01/22/2018 11:03 AM

## 2018-01-22 NOTE — Progress Notes (Signed)
ELECTROPHYSIOLOGY CONSULT NOTE  Patient ID: Gregory Crane MRN: [id number], DOB/AGE: Sep 09, 1970   Admit date: (Not on file) Date of Consult: 01/22/2018  Primary Physician: Gregory Pheasant, MD Primary Cardiologist: CE Reason for Consultation: Cryptogenic stroke; recommendations regarding Implantable Loop Recorder  History of Present Illness  EP has been asked to evaluate Gregory Crane for placement of an implantable loop recorder to monitor for atrial fibrillation by Dr End.  The patient was admitted on 01/05/18 with fogginess and L facial droop.  They first developed symptoms while at work.  Imaging demonstrated  Severe stenosis of L ACA.  MRI >> acute putamen infarct -- he has undergone workup for stroke including echocardiogram and carotid dopplers.  The patient has been monitored on telemetry which has demonstrated sinus rhythm with no arrhythmias.  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  Lab work is reviewed.  Prior to admission, the patient denies chest pain, shortness of breath, dizziness, palpitations, or syncope.  They are no significant residual from his strokes  .     Past Medical History:  Diagnosis Date  . Allergy   . Bell's palsy   . Diabetes mellitus without complication (HCC)    diet controlled  . Hypertension   . Hypothyroidism   . Kidney stones   . Stroke [name])      Surgical History:  Past Surgical History:  Procedure Laterality Date  . LUMBAR PUNCTURE     as child  . NO PAST SURGERIES    . TEE WITHOUT CARDIOVERSION N/A 01/07/2018   Procedure: TRANSESOPHAGEAL ECHOCARDIOGRAM (TEE);  Surgeon: Minna Merritts, MD;  Location: [facility] ORS;  Service: Cardiovascular;  Laterality: N/A;      (Not in a hospital admission)  Inpatient Medications:   Allergies: No Known Allergies  Social History   Socioeconomic  History  . Marital status: Married    Spouse name: Gregory Crane  . Number of children: 2  . Years of education: Not on file  . Highest education level: Not on file  Occupational History  . Not on file  Social Needs  . Financial resource strain: Not hard at all  . Food insecurity:    Worry: Never true    Inability: Never true  . Transportation needs:    Medical: No    Non-medical: No  Tobacco Use  . Smoking status: Never Smoker  . Smokeless tobacco: Never Used  Substance and Sexual Activity  . Alcohol use: No    Alcohol/week: 0.0 oz    Comment: very rarely (1-2/year)  . Drug use: No  . Sexual activity: Yes  Lifestyle  . Physical activity:    Days per week: 0 days    Minutes per session: 0 min  . Stress: To some extent  Relationships  . Social connections:    Talks on phone: More than three times a week    Gets together: More than three times a week    Attends religious service: More than 4 times per year    Active member of club or organization: No    Attends meetings of clubs or organizations: Never    Relationship status: Married  . Intimate partner violence:    Fear of current or ex partner: Patient refused    Emotionally abused: Patient refused    Physically abused: Patient refused  Forced sexual activity: Patient refused  Other Topics Concern  . Not on file  Social History Narrative   Lives at home with wife, independent at baseline.     Family History  Problem Relation Age of Onset  . Breast cancer Mother   . Diabetes Father   . Diabetes Sister   . Arthritis Maternal Grandmother   . Diabetes Maternal Grandmother       Review of Systems: All other systems reviewed and are otherwise negative except as noted above.  Physical Exam: Vitals:   01/22/18 1007  BP: 120/80  Pulse: 75  Weight: 272 lb 8 oz (123.6 kg)  Height: 6' (1.829 m)  Alert and oriented in no acute distress HENT- normal Eyes- EOMI, without scleral icterus Skin- warm and dry; without  rashes LN-neg Neck- supple without thyromegaly, JVP-flat, carotids brisk and full without bruits Back-without CVAT or kyphosis Lungs-clear to auscultation CV-Regular rate and rhythm, nl S1 and S2, no murmurs gallops or rubs, S4-absent Abd-soft with active bowel sounds; no midline pulsation or hepatomegaly Pulses-intact femoral and distal MKS-without gross deformity Neuro- Ax O, CN3-12 intact, grossly normal motor and sensory function Affect engaging   Labs:   Lab Results  Component Value Date   WBC 5.9 01/05/2018   HGB 14.4 01/05/2018   HCT 41.8 01/05/2018   MCV 85.6 01/05/2018   PLT 228 01/05/2018   No results for input(s): NA, K, CL, CO2, BUN, CREATININE, CALCIUM, PROT, BILITOT, ALKPHOS, ALT, AST, GLUCOSE in the last 168 hours.  Invalid input(s): LABALBU   Radiology/Studies: Ct Angio Head W Or Wo Contrast  Result Date: 01/06/2018 CLINICAL DATA:  47 year old male with left facial droop and altered mental status found to have an acute on chronic lacunar infarct in the right basal ganglia on brain MRI earlier today. EXAM: CT ANGIOGRAPHY HEAD AND NECK TECHNIQUE: Multidetector CT imaging of the head and neck was performed using the standard protocol during bolus administration of intravenous contrast. Multiplanar CT image reconstructions and MIPs were obtained to evaluate the vascular anatomy. Carotid stenosis measurements (when applicable) are obtained utilizing NASCET criteria, using the distal internal carotid diameter as the denominator. CONTRAST:  56mL ISOVUE-370 IOPAMIDOL (ISOVUE-370) INJECTION 76% COMPARISON:  Brain MRI 0136 hours today. Head CT 01/05/2018 and earlier. FINDINGS: CT HEAD Brain: Stable heterogeneous hypodensity in the right basal ganglia since the CT yesterday. No associated hemorrhage or mass effect. Stable hypodensity near the genu of the left internal capsule. Stable and normal gray-white matter differentiation elsewhere. No midline shift, ventriculomegaly, mass  effect, evidence of mass lesion, or evidence of new cortically based acute infarction. Calvarium and skull base: Stable and negative. Paranasal sinuses: Stable and negative. Orbits: Visualized orbits and scalp soft tissues are within normal limits. CTA NECK Skeleton: No acute osseous abnormality identified. Cervical spine degeneration. Upper chest: Upper lung atelectasis. No superior mediastinal lymphadenopathy. Other neck: Subcentimeter hyperdense right thyroid nodule on series 8, image 46 in the posterior right lobe does not meet consensus criteria for ultrasound follow-up. Other neck soft tissues are negative. No lymphadenopathy. Aortic arch: 4 vessel arch artery arises configuration. The directly left vertebral from the arch. No arch atherosclerosis or great vessel origin stenosis. Right carotid system: Negative right CCA. Minimal soft plaque at the right carotid bifurcation. No stenosis and otherwise negative cervical right ICA. Left carotid system: Minimal soft plaque in the distal left CCA without stenosis. Minimal soft plaque at the left carotid bifurcation. Mild soft plaque in the proximal left ICA including occasional small  foci of low-density plaque such as on series 8, image 81. Proximal left ICA irregularity is maximal at the distal bulb. There is no significant stenosis. Vertebral arteries: Normal proximal right subclavian and right vertebral artery origins. The right vertebral artery is dominant, somewhat dolichoectatic, and patent to the skull base without stenosis. The left vertebral artery arises directly from the arch with no origin stenosis. The left vertebral is mildly non dominant although has a normal caliber overall. The left vertebral is patent to the skull base without stenosis. CTA HEAD Posterior circulation: The distal left vertebral artery is patent to the basilar with no atherosclerosis or stenosis. There is mild atherosclerosis of the right vertebral artery V4 segment without  significant stenosis (series 12, image 133). Patent vertebrobasilar junction. Both AICAs appear dominant. Mildly dolichoectatic basilar artery without stenosis. Normal SCA and PCA origins (duplicated right SCA, normal variant). Both posterior communicating arteries are present. Left PCA branches are normal. The right PCA is asymmetrically smaller and irregular best seen on series 14, image 20. There is up to moderate stenosis of the right P2 segment, but preserved distal right PCA enhancement. Anterior circulation: The right ICA siphon appears dominant and mildly dolichoectatic with mild calcified plaque. No stenosis. Normal right ophthalmic and posterior communicating artery origins. The left siphon is patent, but there is moderate stenosis of the left ICA petrous segment on series 11, image 128, and additional moderate irregularity and stenosis at the distal cavernous segment on series 13, image 112. The left supraclinoid ICA also appears irregular. The left ophthalmic and posterior communicating artery origins remain normal. Both carotid termini are patent. However, there is severe stenosis of the left ACA origin. The A1 segments appear codominant distally. The anterior communicating artery is normal. Bilateral ACA branches are within normal limits, with a median artery of the corpus callosum (normal variant). Left MCA M1 segment, trifurcation, and left MCA branches are within normal limits. The right MCA M1 segment, bifurcation, and right MCA branches are within normal limits. Venous sinuses: Patent. Anatomic variants: Left vertebral artery arises directly from the arch. Right vertebral artery is dominant. Median artery of the corpus callosum. Delayed phase: No abnormal enhancement identified. Review of the MIP images confirms the above findings IMPRESSION: 1. Negative for large vessel occlusion, and negative for arterial stenosis in the neck. 2. Positive for disproportionate atherosclerosis of the left ICA and  tandem hemodynamically significant (at least moderate) stenoses of the Left ICA siphon. Predominantly soft plaque is demonstrated. 3. Positive also for severe stenosis at the left ACA origin, and at least moderate stenosis of the right PCA P2 segment. 4.  Stable CT appearance of the brain since yesterday. Electronically Signed   By: Genevie Ann M.D.   On: 01/06/2018 12:51   Ct Head Wo Contrast  Result Date: 01/05/2018 CLINICAL DATA:  Left-sided facial droop. EXAM: CT HEAD WITHOUT CONTRAST TECHNIQUE: Contiguous axial images were obtained from the base of the skull through the vertex without intravenous contrast. COMPARISON:  CT scan of September 12, 2017. FINDINGS: Brain: Stable old right periventricular white matter infarction is noted. No mass effect or midline shift is noted. Ventricular size is within normal limits. There is no evidence of mass lesion, hemorrhage or acute infarction. Vascular: No hyperdense vessel or unexpected calcification. Skull: Normal. Negative for fracture or focal lesion. Sinuses/Orbits: No acute finding. Other: None. IMPRESSION: No acute intracranial abnormality seen. Electronically Signed   By: Marijo Conception, M.D.   On: 01/05/2018 21:12   Ct  Angio Neck W Or Wo Contrast  Result Date: 01/06/2018 CLINICAL DATA:  47 year old male with left facial droop and altered mental status found to have an acute on chronic lacunar infarct in the right basal ganglia on brain MRI earlier today. EXAM: CT ANGIOGRAPHY HEAD AND NECK TECHNIQUE: Multidetector CT imaging of the head and neck was performed using the standard protocol during bolus administration of intravenous contrast. Multiplanar CT image reconstructions and MIPs were obtained to evaluate the vascular anatomy. Carotid stenosis measurements (when applicable) are obtained utilizing NASCET criteria, using the distal internal carotid diameter as the denominator. CONTRAST:  2mL ISOVUE-370 IOPAMIDOL (ISOVUE-370) INJECTION 76% COMPARISON:  Brain  MRI 0136 hours today. Head CT 01/05/2018 and earlier. FINDINGS: CT HEAD Brain: Stable heterogeneous hypodensity in the right basal ganglia since the CT yesterday. No associated hemorrhage or mass effect. Stable hypodensity near the genu of the left internal capsule. Stable and normal gray-white matter differentiation elsewhere. No midline shift, ventriculomegaly, mass effect, evidence of mass lesion, or evidence of new cortically based acute infarction. Calvarium and skull base: Stable and negative. Paranasal sinuses: Stable and negative. Orbits: Visualized orbits and scalp soft tissues are within normal limits. CTA NECK Skeleton: No acute osseous abnormality identified. Cervical spine degeneration. Upper chest: Upper lung atelectasis. No superior mediastinal lymphadenopathy. Other neck: Subcentimeter hyperdense right thyroid nodule on series 8, image 46 in the posterior right lobe does not meet consensus criteria for ultrasound follow-up. Other neck soft tissues are negative. No lymphadenopathy. Aortic arch: 4 vessel arch artery arises configuration. The directly left vertebral from the arch. No arch atherosclerosis or great vessel origin stenosis. Right carotid system: Negative right CCA. Minimal soft plaque at the right carotid bifurcation. No stenosis and otherwise negative cervical right ICA. Left carotid system: Minimal soft plaque in the distal left CCA without stenosis. Minimal soft plaque at the left carotid bifurcation. Mild soft plaque in the proximal left ICA including occasional small foci of low-density plaque such as on series 8, image 81. Proximal left ICA irregularity is maximal at the distal bulb. There is no significant stenosis. Vertebral arteries: Normal proximal right subclavian and right vertebral artery origins. The right vertebral artery is dominant, somewhat dolichoectatic, and patent to the skull base without stenosis. The left vertebral artery arises directly from the arch with no origin  stenosis. The left vertebral is mildly non dominant although has a normal caliber overall. The left vertebral is patent to the skull base without stenosis. CTA HEAD Posterior circulation: The distal left vertebral artery is patent to the basilar with no atherosclerosis or stenosis. There is mild atherosclerosis of the right vertebral artery V4 segment without significant stenosis (series 12, image 133). Patent vertebrobasilar junction. Both AICAs appear dominant. Mildly dolichoectatic basilar artery without stenosis. Normal SCA and PCA origins (duplicated right SCA, normal variant). Both posterior communicating arteries are present. Left PCA branches are normal. The right PCA is asymmetrically smaller and irregular best seen on series 14, image 20. There is up to moderate stenosis of the right P2 segment, but preserved distal right PCA enhancement. Anterior circulation: The right ICA siphon appears dominant and mildly dolichoectatic with mild calcified plaque. No stenosis. Normal right ophthalmic and posterior communicating artery origins. The left siphon is patent, but there is moderate stenosis of the left ICA petrous segment on series 11, image 128, and additional moderate irregularity and stenosis at the distal cavernous segment on series 13, image 112. The left supraclinoid ICA also appears irregular. The left ophthalmic and posterior  communicating artery origins remain normal. Both carotid termini are patent. However, there is severe stenosis of the left ACA origin. The A1 segments appear codominant distally. The anterior communicating artery is normal. Bilateral ACA branches are within normal limits, with a median artery of the corpus callosum (normal variant). Left MCA M1 segment, trifurcation, and left MCA branches are within normal limits. The right MCA M1 segment, bifurcation, and right MCA branches are within normal limits. Venous sinuses: Patent. Anatomic variants: Left vertebral artery arises directly  from the arch. Right vertebral artery is dominant. Median artery of the corpus callosum. Delayed phase: No abnormal enhancement identified. Review of the MIP images confirms the above findings IMPRESSION: 1. Negative for large vessel occlusion, and negative for arterial stenosis in the neck. 2. Positive for disproportionate atherosclerosis of the left ICA and tandem hemodynamically significant (at least moderate) stenoses of the Left ICA siphon. Predominantly soft plaque is demonstrated. 3. Positive also for severe stenosis at the left ACA origin, and at least moderate stenosis of the right PCA P2 segment. 4.  Stable CT appearance of the brain since yesterday. Electronically Signed   By: Genevie Ann M.D.   On: 01/06/2018 12:51   Mr Brain Wo Contrast  Result Date: 01/06/2018 CLINICAL DATA:  47 y/o M; left facial droop. Awoke feeling like he was in a fog. Neuro deficit(s), subacute. EXAM: MRI HEAD WITHOUT CONTRAST TECHNIQUE: Multiplanar, multiecho pulse sequences of the brain and surrounding structures were obtained without intravenous contrast. COMPARISON:  01/05/2018 CT head.  09/12/2017 MRI head. FINDINGS: Brain: Small focus of reduced diffusion extending from right putamen to caudate body traversing the corona radiata compatible with acute/early subacute infarction. No associated hemorrhage or mass effect. Just superior to the infarct is a focus of encephalomalacia involving the caudate body and lentiform nucleus from prior infarction as seen on the prior MRI of the brain. No focal mass effect, extra-axial collection, hydrocephalus, or extra-axial collection. Vascular: Normal flow voids. Skull and upper cervical spine: Normal marrow signal. Sinuses/Orbits: Negative. Other: None. IMPRESSION: 1. Small acute/early subacute infarction extending from right putamen to caudate body traversing the corona radiata. No hemorrhage or mass effect. 2. Adjacent chronic lacunar infarct in the right basal ganglia. These results  were called by telephone at the time of interpretation on 01/06/2018 at 2:17 am to Dr. Charlesetta Ivory , who verbally acknowledged these results. Electronically Signed   By: Kristine Garbe M.D.   On: 01/06/2018 02:19    12-lead ECG sinus 75 13/08/35  (personally reviewed) All prior EKG's in EPIC reviewed with no documented atrial fibrillation  Telemetry no afib (personally reviewed)  Assessment and Plan:  Recurrent strokes--Basal Ganglia/Putamel  DM  Cerebrovascular Disease  HTN  Difficult situation-- recurrent strokes, suggestive of small vessel disease.  They do not seem to be likely embolic.  After discussions however with Dr. Doy Mince, given the recurrent strokes, she thinks that the exclusion of atrial fibrillation is important to even though it is relatively low likelihood.  I think the situation is further complicated by the lack of data regarding the appropriate response to SCAF in the setting of recurrent strokes.  It is further challenged by the degree of cerebrovascular disease.  In this regard, risk factor modification is essential; we will increase his Lipitor from 40--80.  I told him also that when the 3 months of DAPT  is over would go along with Plavix rather than aspirin.  BP is reasonably controlled   More than 50% of 85 min  was spent in counseling related to the above    Virl Axe, MD 01/22/2018 11:03 AM

## 2018-01-27 ENCOUNTER — Telehealth: Payer: Self-pay | Admitting: Internal Medicine

## 2018-01-27 ENCOUNTER — Ambulatory Visit
Admission: RE | Admit: 2018-01-27 | Discharge: 2018-01-27 | Disposition: A | Discharge status: Home / Self Care | Payer: Managed Care, Other (non HMO) | Source: Ambulatory Visit | Attending: Internal Medicine | Admitting: Internal Medicine

## 2018-01-27 ENCOUNTER — Ambulatory Visit (INDEPENDENT_AMBULATORY_CARE_PROVIDER_SITE_OTHER): Payer: Self-pay | Admitting: *Deleted

## 2018-01-27 ENCOUNTER — Encounter
Admission: RE | Disposition: A | Discharge status: Home / Self Care | Payer: Self-pay | Source: Ambulatory Visit | Attending: Internal Medicine

## 2018-01-27 DIAGNOSIS — Z8673 Personal history of transient ischemic attack (TIA), and cerebral infarction without residual deficits: Secondary | ICD-10-CM | POA: Diagnosis not present

## 2018-01-27 DIAGNOSIS — G510 Bell's palsy: Secondary | ICD-10-CM | POA: Diagnosis not present

## 2018-01-27 DIAGNOSIS — I639 Cerebral infarction, unspecified: Secondary | ICD-10-CM | POA: Diagnosis not present

## 2018-01-27 DIAGNOSIS — I10 Essential (primary) hypertension: Secondary | ICD-10-CM | POA: Diagnosis not present

## 2018-01-27 DIAGNOSIS — E119 Type 2 diabetes mellitus without complications: Secondary | ICD-10-CM | POA: Insufficient documentation

## 2018-01-27 DIAGNOSIS — Z833 Family history of diabetes mellitus: Secondary | ICD-10-CM | POA: Diagnosis not present

## 2018-01-27 DIAGNOSIS — Z87442 Personal history of urinary calculi: Secondary | ICD-10-CM | POA: Insufficient documentation

## 2018-01-27 DIAGNOSIS — Z9889 Other specified postprocedural states: Secondary | ICD-10-CM | POA: Insufficient documentation

## 2018-01-27 DIAGNOSIS — E039 Hypothyroidism, unspecified: Secondary | ICD-10-CM | POA: Diagnosis not present

## 2018-01-27 DIAGNOSIS — G459 Transient cerebral ischemic attack, unspecified: Secondary | ICD-10-CM | POA: Diagnosis present

## 2018-01-27 HISTORY — PX: LOOP RECORDER INSERTION: EP1214

## 2018-01-27 LAB — GLUCOSE, CAPILLARY: Glucose-Capillary: 219 mg/dL — ABNORMAL HIGH (ref 65–99)

## 2018-01-27 SURGERY — LOOP RECORDER INSERTION
Anesthesia: LOCAL

## 2018-01-27 SURGICAL SUPPLY — 2 items
LOOP REVEAL LINQSYS (Prosthesis & Implant Heart) ×2 IMPLANT
PACK LOOP INSERTION (CUSTOM PROCEDURE TRAY) ×2 IMPLANT

## 2018-01-27 NOTE — Progress Notes (Signed)
Pt seen d/t complaints of bleeding s/p loop recorder insertion, pt had applied a bandage over tegaderm and no drainage was noted, bleeding appeared to have stopped, new 4x4s applied with paper tape over tegaderm, educated pt and wife that site may drain some if pt sleeps on stomach just from pressure. Informed that in 48 hours they could remove the clear tegaderm but keep steri-strips in place and dry until wound check, pt and wife voiced understanding.

## 2018-01-27 NOTE — Telephone Encounter (Signed)
Pt agreeable to apt today at 3:00pm in [place] for wound check.

## 2018-01-27 NOTE — Interval H&P Note (Signed)
History and Physical Interval Note:  01/27/2018 9:04 AM  Gregory Crane  has presented today for surgery, with the diagnosis of Loop Recorder Insertion   Bedside   [employer] Rep   Stroke   cc: Brayton El  The various methods of treatment have been discussed with the patient and family. After consideration of risks, benefits and other options for treatment, the patient has consented to  Procedure(s): LOOP RECORDER INSERTION (N/A) as a surgical intervention .  The patient's history has been reviewed, patient examined, no change in status, stable for surgery.  I have reviewed the patient's chart and labs.  Questions were answered to the patient's satisfaction.     Virl Axe

## 2018-01-27 NOTE — Telephone Encounter (Signed)
New Message    1. Has your device fired? no  2. Is you device beeping? no  3. Are you experiencing draining or swelling at device site? bleeding  4. Are you calling to see if we received your device transmission? no  5. Have you passed out? no    Please route to [place]

## 2018-01-27 NOTE — Discharge Instructions (Signed)
You may shower tomorrow (Wednesday)  You may remove the top dressing on Sunday 6/9  If you experience any bleeding, please hold pressure and notify Dr Caryl Comes

## 2018-01-28 ENCOUNTER — Encounter: Payer: Self-pay | Admitting: Internal Medicine

## 2018-01-29 ENCOUNTER — Ambulatory Visit (INDEPENDENT_AMBULATORY_CARE_PROVIDER_SITE_OTHER): Payer: Self-pay | Admitting: *Deleted

## 2018-01-29 ENCOUNTER — Telehealth: Payer: Self-pay | Admitting: Cardiology

## 2018-01-29 DIAGNOSIS — I639 Cerebral infarction, unspecified: Secondary | ICD-10-CM

## 2018-01-29 NOTE — Telephone Encounter (Signed)
Patient called and stated that when he removed the top dressing the steri strips came off. He wants to know if it is ok to leave the steri strips off or does he need to come to the office to have more placed on the device site. After consulting w/ Device Tech RN informed pt that he would need to have the steri strips re applied. Pt agreed to an appt for today at 4:30 PM.

## 2018-01-30 NOTE — Progress Notes (Signed)
Patient presents to the office for reapplication of steri strips s/p ILR implant on 01/27/18. Patient admits to removing tegaderm dressing per hospital instructions. Patient states that when he removed the dressing the steri strips came off too. Patient/wife loosely reapplied tegaderm dressing prior to appt. Tegaderm dressing/steri strips removed this office visit. Slight oozing of dark red blood noted at incision. Manual pressure applied x 51mins with relief. Incision edges reapproximated. New steri strips applied. Instructed patient to leave steri strips in place until upcoming wound check appointment. Instructed patient to apply pressure if site bleeds prior to next appt. Patient/spouse verbalized understanding.

## 2018-01-31 ENCOUNTER — Other Ambulatory Visit: Payer: Self-pay | Admitting: Internal Medicine

## 2018-02-02 ENCOUNTER — Encounter (INDEPENDENT_AMBULATORY_CARE_PROVIDER_SITE_OTHER): Payer: Self-pay | Admitting: Vascular Surgery

## 2018-02-02 ENCOUNTER — Other Ambulatory Visit: Payer: Self-pay | Admitting: Internal Medicine

## 2018-02-02 ENCOUNTER — Ambulatory Visit (INDEPENDENT_AMBULATORY_CARE_PROVIDER_SITE_OTHER): Payer: Managed Care, Other (non HMO) | Admitting: Vascular Surgery

## 2018-02-02 VITALS — BP 138/78 | HR 73 | Resp 15 | Ht 72.0 in | Wt 277.0 lb

## 2018-02-02 DIAGNOSIS — I10 Essential (primary) hypertension: Secondary | ICD-10-CM

## 2018-02-02 DIAGNOSIS — E11319 Type 2 diabetes mellitus with unspecified diabetic retinopathy without macular edema: Secondary | ICD-10-CM | POA: Diagnosis not present

## 2018-02-02 DIAGNOSIS — I639 Cerebral infarction, unspecified: Secondary | ICD-10-CM | POA: Diagnosis not present

## 2018-02-02 DIAGNOSIS — E785 Hyperlipidemia, unspecified: Secondary | ICD-10-CM

## 2018-02-02 DIAGNOSIS — I679 Cerebrovascular disease, unspecified: Secondary | ICD-10-CM

## 2018-02-02 DIAGNOSIS — I6523 Occlusion and stenosis of bilateral carotid arteries: Secondary | ICD-10-CM | POA: Diagnosis not present

## 2018-02-03 ENCOUNTER — Encounter (INDEPENDENT_AMBULATORY_CARE_PROVIDER_SITE_OTHER): Payer: Self-pay | Admitting: Vascular Surgery

## 2018-02-03 DIAGNOSIS — I6529 Occlusion and stenosis of unspecified carotid artery: Secondary | ICD-10-CM | POA: Insufficient documentation

## 2018-02-03 DIAGNOSIS — I779 Disorder of arteries and arterioles, unspecified: Secondary | ICD-10-CM | POA: Insufficient documentation

## 2018-02-03 NOTE — Telephone Encounter (Signed)
This is not normally filled by you. Ok to fill?

## 2018-02-03 NOTE — Progress Notes (Signed)
MRN : [id number]  Gregory Crane is a 47 y.o. ([birth year]) male who presents with chief complaint of  Chief Complaint  Patient presents with  . New Patient (Initial Visit)    Carotid stenosis  .  History of Present Illness:   The patient is seen for evaluation of carotid stenosis. The carotid stenosis was identified after he experienced a CVA and had a work up including CTA.  The patient denies amaurosis fugax. There is no recent/recurrent history of TIA symptoms or focal motor deficits. There is prior documented CVA.  There is no history of migraine headaches. There is no history of seizures.  The patient is taking enteric-coated aspirin 81 mg daily.  The patient has a history of coronary artery disease, no recent episodes of angina or shortness of breath. The patient denies PAD or claudication symptoms. There is a history of hyperlipidemia which is being treated with a statin.   CTA is reviewed by me and he has mild to moderate right ICA stenosis.  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    Current Meds  Medication Sig  . aspirin EC 81 MG EC tablet Take 1 tablet (81 mg total) by mouth daily.  Marland Kitchen atorvastatin (LIPITOR) 80 MG tablet Take 1 tablet (80 mg total) by mouth daily.  . clopidogrel (PLAVIX) 75 MG tablet Take 1 tablet (75 mg total) by mouth daily.  . DULoxetine (CYMBALTA) 20 MG capsule   . glucose blood (FREESTYLE LITE) test strip Check blood sugars twice a day (Dx. 250.02)  . insulin lispro (HUMALOG) 100 UNIT/ML injection Humalog U-100 Insulin 100 unit/mL subcutaneous solution  . INSULIN SYRINGE .5CC/29G 29G X 1/2" 0.5 ML MISC USE AS DIRECTED WITH INSULIN, 1 NEEDLE PER USE  . Insulin Syringe-Needle U-100 27G X 1/2" 1 ML MISC Use as directed with insulin. One needle per use.  Marland Kitchen LANTUS 100 UNIT/ML injection INJECT 0.15 ML(15 UNITS) INTO THE SKIN EVERY NIGHT AT BEDTIME  . levothyroxine  (SYNTHROID) 112 MCG tablet Take 1 tablet (112 mcg total) by mouth daily before breakfast.  . losartan (COZAAR) 50 MG tablet Take 1 tablet (50 mg total) by mouth daily.  Marland Kitchen LYRICA 100 MG capsule TAKE 1 CAPSULE BY MOUTH THREE TIMES DAILY  . metFORMIN (GLUCOPHAGE) 500 MG tablet TAKE 1 TABLET BY MOUTH DAILY WITH BREAKFAST  . ONETOUCH DELICA LANCETS FINE MISC 1 applicator by Other route 4 (four) times daily.    Past Medical History:  Diagnosis Date  . Allergy   . Bell's palsy   . Diabetes mellitus without complication (HCC)    diet controlled  . Hypertension   . Hypothyroidism   . Kidney stones   . Stroke [facility])     Past Surgical History:  Procedure Laterality Date  . LOOP RECORDER INSERTION N/A 01/27/2018   Procedure: LOOP RECORDER INSERTION;  Surgeon: [name], MD;  Location: [place] [facility];  Service: Cardiovascular;  Laterality: N/A;  . LUMBAR PUNCTURE     as child  . NO PAST SURGERIES    . TEE WITHOUT CARDIOVERSION N/A 01/07/2018   Procedure: TRANSESOPHAGEAL ECHOCARDIOGRAM (TEE);  Surgeon: Minna Merritts, MD;  Location: [facility];  Service: Cardiovascular;  Laterality: N/A;    Social History Social History   Tobacco Use  . Smoking status: Never Smoker  . Smokeless tobacco: Never Used  Substance Use Topics  . Alcohol use: No  Alcohol/week: 0.0 oz    Comment: very rarely (1-2/year)  . Drug use: No    Family History Family History  Problem Relation Age of Onset  . Breast cancer Mother   . Diabetes Father   . Diabetes Sister   . Arthritis Maternal Grandmother   . Diabetes Maternal Grandmother   No family history of bleeding/clotting disorders, porphyria or autoimmune disease   No Known Allergies   REVIEW OF SYSTEMS (Negative unless checked)  Constitutional: [] Weight loss  [] Fever  [] Chills Cardiac: [] Chest pain   [] Chest pressure   [] Palpitations   [] Shortness of breath when laying flat   [] Shortness of breath with exertion. Vascular:  [] Pain in  legs with walking   [] Pain in legs at rest  [] History of DVT   [] Phlebitis   [] Swelling in legs   [] Varicose veins   [] Non-healing ulcers Pulmonary:   [] Uses home oxygen   [] Productive cough   [] Hemoptysis   [] Wheeze  [] COPD   [] Asthma Neurologic:  [] Dizziness   [] Seizures   [x] History of stroke   [] History of TIA  [] Aphasia   [] Vissual changes   [] Weakness or numbness in arm   [] Weakness or numbness in leg Musculoskeletal:   [] Joint swelling   [] Joint pain   [] Low back pain Hematologic:  [] Easy bruising  [] Easy bleeding   [] Hypercoagulable state   [] Anemic Gastrointestinal:  [] Diarrhea   [] Vomiting  [] Gastroesophageal reflux/heartburn   [] Difficulty swallowing. Genitourinary:  [] Chronic kidney disease   [] Difficult urination  [] Frequent urination   [] Blood in urine Skin:  [] Rashes   [] Ulcers  Psychological:  [] History of anxiety   []  History of major depression.  Physical Examination  Vitals:   02/02/18 1617  BP: 138/78  Pulse: 73  Resp: 15  Weight: 277 lb (125.6 kg)  Height: 6' (1.829 m)   Body mass index is 37.57 kg/m. Gen: WD/WN, NAD Head: [place]/AT, No temporalis wasting.  Ear/Nose/Throat: Hearing grossly intact, nares w/o erythema or drainage, poor dentition Eyes: PER, EOMI, sclera nonicteric.  Neck: Supple, no masses.  No bruit or JVD.  Pulmonary:  Good air movement, clear to auscultation bilaterally, no use of accessory muscles.  Cardiac: RRR, normal S1, S2, no Murmurs. Vascular: left carotid bruit Vessel Right Left  Radial Palpable Palpable  Ulnar Palpable Palpable  Brachial Palpable Palpable  Carotid Palpable Palpable  Gastrointestinal: soft, non-distended. No guarding/no peritoneal signs.  Musculoskeletal: M/S 5/5 throughout.  No deformity or atrophy.  Neurologic: CN 2-12 intact. Pain and light touch intact in extremities.  Symmetrical.  Speech is fluent. Motor exam as listed above. Psychiatric: Judgment intact, Mood & affect appropriate for pt's clinical  situation. Dermatologic: No rashes or ulcers noted.  No changes consistent with cellulitis. Lymph : No Cervical lymphadenopathy, no lichenification or skin changes of chronic lymphedema.  CBC Lab Results  Component Value Date   WBC 5.9 01/05/2018   HGB 14.4 01/05/2018   HCT 41.8 01/05/2018   MCV 85.6 01/05/2018   PLT 228 01/05/2018    BMET    Component Value Date/Time   NA 138 01/05/2018 2042   NA 141 03/25/2017 1036   NA 136 01/26/2014 1928   K 4.0 01/05/2018 2042   K 4.0 01/26/2014 1928   CL 105 01/05/2018 2042   CL 102 01/26/2014 1928   CO2 28 01/05/2018 2042   CO2 27 01/26/2014 1928   GLUCOSE 116 (H) 01/05/2018 2042   GLUCOSE 217 (H) 01/26/2014 1928   BUN 22 (H) 01/05/2018 2042   BUN 17  03/25/2017 1036   BUN 9 01/26/2014 1928   CREATININE 1.18 01/05/2018 2042   CREATININE 0.76 01/26/2014 1928   CALCIUM 8.9 01/05/2018 2042   CALCIUM 9.0 01/26/2014 1928   GFRNONAA >60 01/05/2018 2042   GFRNONAA >60 01/26/2014 1928   GFRAA >60 01/05/2018 2042   GFRAA >60 01/26/2014 1928   CrCl cannot be calculated (Patient's most recent lab result is older than the maximum 21 days allowed.).  COAG Lab Results  Component Value Date   INR 0.98 01/05/2018   INR 0.96 09/12/2017   INR 0.95 10/20/2016    Radiology Ct Angio Head W Or Wo Contrast  Result Date: 01/06/2018 CLINICAL DATA:  47 year old male with left facial droop and altered mental status found to have an acute on chronic lacunar infarct in the right basal ganglia on brain MRI earlier today. EXAM: CT ANGIOGRAPHY HEAD AND NECK TECHNIQUE: Multidetector CT imaging of the head and neck was performed using the standard protocol during bolus administration of intravenous contrast. Multiplanar CT image reconstructions and MIPs were obtained to evaluate the vascular anatomy. Carotid stenosis measurements (when applicable) are obtained utilizing NASCET criteria, using the distal internal carotid diameter as the denominator. CONTRAST:   44mL ISOVUE-370 IOPAMIDOL (ISOVUE-370) INJECTION 76% COMPARISON:  Brain MRI 0136 hours today. Head CT 01/05/2018 and earlier. FINDINGS: CT HEAD Brain: Stable heterogeneous hypodensity in the right basal ganglia since the CT yesterday. No associated hemorrhage or mass effect. Stable hypodensity near the genu of the left internal capsule. Stable and normal gray-white matter differentiation elsewhere. No midline shift, ventriculomegaly, mass effect, evidence of mass lesion, or evidence of new cortically based acute infarction. Calvarium and skull base: Stable and negative. Paranasal sinuses: Stable and negative. Orbits: Visualized orbits and scalp soft tissues are within normal limits. CTA NECK Skeleton: No acute osseous abnormality identified. Cervical spine degeneration. Upper chest: Upper lung atelectasis. No superior mediastinal lymphadenopathy. Other neck: Subcentimeter hyperdense right thyroid nodule on series 8, image 46 in the posterior right lobe does not meet consensus criteria for ultrasound follow-up. Other neck soft tissues are negative. No lymphadenopathy. Aortic arch: 4 vessel arch artery arises configuration. The directly left vertebral from the arch. No arch atherosclerosis or great vessel origin stenosis. Right carotid system: Negative right CCA. Minimal soft plaque at the right carotid bifurcation. No stenosis and otherwise negative cervical right ICA. Left carotid system: Minimal soft plaque in the distal left CCA without stenosis. Minimal soft plaque at the left carotid bifurcation. Mild soft plaque in the proximal left ICA including occasional small foci of low-density plaque such as on series 8, image 81. Proximal left ICA irregularity is maximal at the distal bulb. There is no significant stenosis. Vertebral arteries: Normal proximal right subclavian and right vertebral artery origins. The right vertebral artery is dominant, somewhat dolichoectatic, and patent to the skull base without  stenosis. The left vertebral artery arises directly from the arch with no origin stenosis. The left vertebral is mildly non dominant although has a normal caliber overall. The left vertebral is patent to the skull base without stenosis. CTA HEAD Posterior circulation: The distal left vertebral artery is patent to the basilar with no atherosclerosis or stenosis. There is mild atherosclerosis of the right vertebral artery V4 segment without significant stenosis (series 12, image 133). Patent vertebrobasilar junction. Both AICAs appear dominant. Mildly dolichoectatic basilar artery without stenosis. Normal SCA and PCA origins (duplicated right SCA, normal variant). Both posterior communicating arteries are present. Left PCA branches are normal. The right PCA  is asymmetrically smaller and irregular best seen on series 14, image 20. There is up to moderate stenosis of the right P2 segment, but preserved distal right PCA enhancement. Anterior circulation: The right ICA siphon appears dominant and mildly dolichoectatic with mild calcified plaque. No stenosis. Normal right ophthalmic and posterior communicating artery origins. The left siphon is patent, but there is moderate stenosis of the left ICA petrous segment on series 11, image 128, and additional moderate irregularity and stenosis at the distal cavernous segment on series 13, image 112. The left supraclinoid ICA also appears irregular. The left ophthalmic and posterior communicating artery origins remain normal. Both carotid termini are patent. However, there is severe stenosis of the left ACA origin. The A1 segments appear codominant distally. The anterior communicating artery is normal. Bilateral ACA branches are within normal limits, with a median artery of the corpus callosum (normal variant). Left MCA M1 segment, trifurcation, and left MCA branches are within normal limits. The right MCA M1 segment, bifurcation, and right MCA branches are within normal limits.  Venous sinuses: Patent. Anatomic variants: Left vertebral artery arises directly from the arch. Right vertebral artery is dominant. Median artery of the corpus callosum. Delayed phase: No abnormal enhancement identified. Review of the MIP images confirms the above findings IMPRESSION: 1. Negative for large vessel occlusion, and negative for arterial stenosis in the neck. 2. Positive for disproportionate atherosclerosis of the left ICA and tandem hemodynamically significant (at least moderate) stenoses of the Left ICA siphon. Predominantly soft plaque is demonstrated. 3. Positive also for severe stenosis at the left ACA origin, and at least moderate stenosis of the right PCA P2 segment. 4.  Stable CT appearance of the brain since yesterday. Electronically Signed   By: Genevie Ann M.D.   On: 01/06/2018 12:51   Ct Head Wo Contrast  Result Date: 01/05/2018 CLINICAL DATA:  Left-sided facial droop. EXAM: CT HEAD WITHOUT CONTRAST TECHNIQUE: Contiguous axial images were obtained from the base of the skull through the vertex without intravenous contrast. COMPARISON:  CT scan of September 12, 2017. FINDINGS: Brain: Stable old right periventricular white matter infarction is noted. No mass effect or midline shift is noted. Ventricular size is within normal limits. There is no evidence of mass lesion, hemorrhage or acute infarction. Vascular: No hyperdense vessel or unexpected calcification. Skull: Normal. Negative for fracture or focal lesion. Sinuses/Orbits: No acute finding. Other: None. IMPRESSION: No acute intracranial abnormality seen. Electronically Signed   By: Marijo Conception, M.D.   On: 01/05/2018 21:12   Ct Angio Neck W Or Wo Contrast  Result Date: 01/06/2018 CLINICAL DATA:  47 year old male with left facial droop and altered mental status found to have an acute on chronic lacunar infarct in the right basal ganglia on brain MRI earlier today. EXAM: CT ANGIOGRAPHY HEAD AND NECK TECHNIQUE: Multidetector CT imaging of  the head and neck was performed using the standard protocol during bolus administration of intravenous contrast. Multiplanar CT image reconstructions and MIPs were obtained to evaluate the vascular anatomy. Carotid stenosis measurements (when applicable) are obtained utilizing NASCET criteria, using the distal internal carotid diameter as the denominator. CONTRAST:  37mL ISOVUE-370 IOPAMIDOL (ISOVUE-370) INJECTION 76% COMPARISON:  Brain MRI 0136 hours today. Head CT 01/05/2018 and earlier. FINDINGS: CT HEAD Brain: Stable heterogeneous hypodensity in the right basal ganglia since the CT yesterday. No associated hemorrhage or mass effect. Stable hypodensity near the genu of the left internal capsule. Stable and normal gray-white matter differentiation elsewhere. No midline shift, ventriculomegaly,  mass effect, evidence of mass lesion, or evidence of new cortically based acute infarction. Calvarium and skull base: Stable and negative. Paranasal sinuses: Stable and negative. Orbits: Visualized orbits and scalp soft tissues are within normal limits. CTA NECK Skeleton: No acute osseous abnormality identified. Cervical spine degeneration. Upper chest: Upper lung atelectasis. No superior mediastinal lymphadenopathy. Other neck: Subcentimeter hyperdense right thyroid nodule on series 8, image 46 in the posterior right lobe does not meet consensus criteria for ultrasound follow-up. Other neck soft tissues are negative. No lymphadenopathy. Aortic arch: 4 vessel arch artery arises configuration. The directly left vertebral from the arch. No arch atherosclerosis or great vessel origin stenosis. Right carotid system: Negative right CCA. Minimal soft plaque at the right carotid bifurcation. No stenosis and otherwise negative cervical right ICA. Left carotid system: Minimal soft plaque in the distal left CCA without stenosis. Minimal soft plaque at the left carotid bifurcation. Mild soft plaque in the proximal left ICA including  occasional small foci of low-density plaque such as on series 8, image 81. Proximal left ICA irregularity is maximal at the distal bulb. There is no significant stenosis. Vertebral arteries: Normal proximal right subclavian and right vertebral artery origins. The right vertebral artery is dominant, somewhat dolichoectatic, and patent to the skull base without stenosis. The left vertebral artery arises directly from the arch with no origin stenosis. The left vertebral is mildly non dominant although has a normal caliber overall. The left vertebral is patent to the skull base without stenosis. CTA HEAD Posterior circulation: The distal left vertebral artery is patent to the basilar with no atherosclerosis or stenosis. There is mild atherosclerosis of the right vertebral artery V4 segment without significant stenosis (series 12, image 133). Patent vertebrobasilar junction. Both AICAs appear dominant. Mildly dolichoectatic basilar artery without stenosis. Normal SCA and PCA origins (duplicated right SCA, normal variant). Both posterior communicating arteries are present. Left PCA branches are normal. The right PCA is asymmetrically smaller and irregular best seen on series 14, image 20. There is up to moderate stenosis of the right P2 segment, but preserved distal right PCA enhancement. Anterior circulation: The right ICA siphon appears dominant and mildly dolichoectatic with mild calcified plaque. No stenosis. Normal right ophthalmic and posterior communicating artery origins. The left siphon is patent, but there is moderate stenosis of the left ICA petrous segment on series 11, image 128, and additional moderate irregularity and stenosis at the distal cavernous segment on series 13, image 112. The left supraclinoid ICA also appears irregular. The left ophthalmic and posterior communicating artery origins remain normal. Both carotid termini are patent. However, there is severe stenosis of the left ACA origin. The A1  segments appear codominant distally. The anterior communicating artery is normal. Bilateral ACA branches are within normal limits, with a median artery of the corpus callosum (normal variant). Left MCA M1 segment, trifurcation, and left MCA branches are within normal limits. The right MCA M1 segment, bifurcation, and right MCA branches are within normal limits. Venous sinuses: Patent. Anatomic variants: Left vertebral artery arises directly from the arch. Right vertebral artery is dominant. Median artery of the corpus callosum. Delayed phase: No abnormal enhancement identified. Review of the MIP images confirms the above findings IMPRESSION: 1. Negative for large vessel occlusion, and negative for arterial stenosis in the neck. 2. Positive for disproportionate atherosclerosis of the left ICA and tandem hemodynamically significant (at least moderate) stenoses of the Left ICA siphon. Predominantly soft plaque is demonstrated. 3. Positive also for severe stenosis at  the left ACA origin, and at least moderate stenosis of the right PCA P2 segment. 4.  Stable CT appearance of the brain since yesterday. Electronically Signed   By: Genevie Ann M.D.   On: 01/06/2018 12:51   Mr Brain Wo Contrast  Result Date: 01/06/2018 CLINICAL DATA:  47 y/o M; left facial droop. Awoke feeling like he was in a fog. Neuro deficit(s), subacute. EXAM: MRI HEAD WITHOUT CONTRAST TECHNIQUE: Multiplanar, multiecho pulse sequences of the brain and surrounding structures were obtained without intravenous contrast. COMPARISON:  01/05/2018 CT head.  09/12/2017 MRI head. FINDINGS: Brain: Small focus of reduced diffusion extending from right putamen to caudate body traversing the corona radiata compatible with acute/early subacute infarction. No associated hemorrhage or mass effect. Just superior to the infarct is a focus of encephalomalacia involving the caudate body and lentiform nucleus from prior infarction as seen on the prior MRI of the brain. No  focal mass effect, extra-axial collection, hydrocephalus, or extra-axial collection. Vascular: Normal flow voids. Skull and upper cervical spine: Normal marrow signal. Sinuses/Orbits: Negative. Other: None. IMPRESSION: 1. Small acute/early subacute infarction extending from right putamen to caudate body traversing the corona radiata. No hemorrhage or mass effect. 2. Adjacent chronic lacunar infarct in the right basal ganglia. These results were called by telephone at the time of interpretation on 01/06/2018 at 2:17 am to Dr. Charlesetta Ivory , who verbally acknowledged these results. Electronically Signed   By: Kristine Garbe M.D.   On: 01/06/2018 02:19     Assessment/Plan 1. Bilateral carotid artery stenosis Recommend:  Given the patient's asymptomatic subcritical stenosis no further invasive testing or surgery at this time.  CTA is reviewed by me and he has mild to moderate right ICA stenosis.  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   Continue dual antiplatelet therapy as prescribed Continue management of CAD, HTN and Hyperlipidemia Healthy heart diet,  encouraged exercise at least 4 times per week  Follow up in 6 months with duplex ultrasound and physical exam    A total of 70 minutes was spent with this patient and greater than 50% was spent in counseling and coordination of care with the patient.  Discussion included the treatment options for vascular disease including indications for surgery and intervention.  Also discussed is the appropriate timing of treatment.  In addition medical therapy was discussed.  - VAS US CAROTID; Future  2. Intracranial vascular stenosis See #1  3. Essential hypertension Continue antihypertensive medications as already ordered, these medications have been reviewed and there are no changes at this time.   4. Acute CVA (cerebrovascular accident) ([place]) Continue  dual antiplatelet therapy as prescribed  5. Type 2 diabetes mellitus with retinopathy, without long-term current use of insulin, macular edema presence unspecified, unspecified laterality, unspecified retinopathy severity ([place]) Continue hypoglycemic medications as already ordered, these medications have been reviewed and there are no changes at this time.  Hgb A1C to be monitored as already arranged by primary service   6. Hyperlipidemia, unspecified hyperlipidemia type Continue statin as ordered and reviewed, no changes at this time     Hortencia Pilar, MD  02/03/2018 6:34 PM

## 2018-02-04 NOTE — Telephone Encounter (Signed)
ok'd refill for plavix and aspirin  (#90 with one refill).

## 2018-02-11 ENCOUNTER — Telehealth: Payer: Self-pay

## 2018-02-11 ENCOUNTER — Ambulatory Visit (INDEPENDENT_AMBULATORY_CARE_PROVIDER_SITE_OTHER): Payer: Self-pay | Admitting: *Deleted

## 2018-02-11 DIAGNOSIS — I639 Cerebral infarction, unspecified: Secondary | ICD-10-CM

## 2018-02-11 LAB — CUP PACEART INCLINIC DEVICE CHECK
Date Time Interrogation Session: 20190619094115
Implantable Pulse Generator Implant Date: 20190604

## 2018-02-11 NOTE — Telephone Encounter (Signed)
Called pt left VM regarding apt this morning at 9am. Direct number to device clinic given.

## 2018-02-11 NOTE — Progress Notes (Signed)
Wound Loop check in clinic.  Steri-Strips removed, incision edges approximated wound well healed. Battery status: Good. R-waves 0.85mV. 0 symptom episodes, 0 tachy episodes, Pause and brady off at implant. 0 AF episodes. Monthly summary reports and ROV with SK PRN

## 2018-03-02 ENCOUNTER — Ambulatory Visit (INDEPENDENT_AMBULATORY_CARE_PROVIDER_SITE_OTHER): Payer: Managed Care, Other (non HMO) | Admitting: *Deleted

## 2018-03-02 DIAGNOSIS — I639 Cerebral infarction, unspecified: Secondary | ICD-10-CM | POA: Diagnosis not present

## 2018-03-03 NOTE — Progress Notes (Signed)
[facility] Summary Report / Loop Recorder 

## 2018-03-05 LAB — HM DIABETES EYE EXAM

## 2018-03-06 ENCOUNTER — Other Ambulatory Visit: Payer: Self-pay | Admitting: Internal Medicine

## 2018-03-27 ENCOUNTER — Ambulatory Visit: Payer: Managed Care, Other (non HMO) | Admitting: Internal Medicine

## 2018-03-27 ENCOUNTER — Encounter: Payer: Self-pay | Admitting: Internal Medicine

## 2018-03-27 DIAGNOSIS — E11319 Type 2 diabetes mellitus with unspecified diabetic retinopathy without macular edema: Secondary | ICD-10-CM | POA: Diagnosis not present

## 2018-03-27 DIAGNOSIS — E039 Hypothyroidism, unspecified: Secondary | ICD-10-CM

## 2018-03-27 DIAGNOSIS — I10 Essential (primary) hypertension: Secondary | ICD-10-CM | POA: Diagnosis not present

## 2018-03-27 DIAGNOSIS — E785 Hyperlipidemia, unspecified: Secondary | ICD-10-CM

## 2018-03-27 DIAGNOSIS — Z8673 Personal history of transient ischemic attack (TIA), and cerebral infarction without residual deficits: Secondary | ICD-10-CM | POA: Diagnosis not present

## 2018-03-27 DIAGNOSIS — G629 Polyneuropathy, unspecified: Secondary | ICD-10-CM

## 2018-03-27 DIAGNOSIS — R400 Somnolence: Secondary | ICD-10-CM

## 2018-03-27 MED ORDER — PREGABALIN 100 MG PO CAPS: 100.0000 mg | ORAL_CAPSULE | Freq: Three times a day (TID) | ORAL | 1 refills | Status: DC

## 2018-03-27 NOTE — Progress Notes (Signed)
Patient ID: Gregory Crane, male   DOB: 07/10/1971, 47 y.o.   MRN: [id number]   Subjective:    Patient ID: Gregory Crane, male    DOB: 08-31-1970, 47 y.o.   MRN: [id number]  HPI  Patient here for a scheduled follow up.  He is accompanied by his wife.  History obtained from both of them.  He reports he is doing relatively well.  Working.  Doing more physical activity.  Tired at the end of the day.  Falls asleep when he comes home.  Does not feel rested when he wakes up in the am.  Wife reports some shallow breathing.  No chest pain.  No sob.  No acid reflux. No abdominal pain.  Bowels moving.  Blood pressures doing well.  States sugars are doing well.  States had one day since his last discharge that he felt he was in a fog.  No progressive symptoms.  Wife reports no changes c/w previous strokes.  Seeing a retina specialist.     Past Medical History:  Diagnosis Date  . Allergy   . Bell's palsy   . Diabetes mellitus without complication (HCC)    diet controlled  . Hypertension   . Hypothyroidism   . Kidney stones   . Stroke [facility])    Past Surgical History:  Procedure Laterality Date  . LOOP RECORDER INSERTION N/A 01/27/2018   Procedure: LOOP RECORDER INSERTION;  Surgeon: [name], MD;  Location: [facility];  Service: Cardiovascular;  Laterality: N/A;  . LUMBAR PUNCTURE     as child  . NO PAST SURGERIES    . TEE WITHOUT CARDIOVERSION N/A 01/07/2018   Procedure: TRANSESOPHAGEAL ECHOCARDIOGRAM (TEE);  Surgeon: Minna Merritts, MD;  Location: [facility];  Service: Cardiovascular;  Laterality: N/A;   Family History  Problem Relation Age of Onset  . Breast cancer Mother   . Diabetes Father   . Diabetes Sister   . Arthritis Maternal Grandmother   . Diabetes Maternal Grandmother    Social History   Socioeconomic History  . Marital status: Married    Spouse name: [name]  . Number of children: 2  . Years of education: Not on file  . Highest education level: Not on  file  Occupational History  . Not on file  Social Needs  . Financial resource strain: Not hard at all  . Food insecurity:    Worry: Never true    Inability: Never true  . Transportation needs:    Medical: No    Non-medical: No  Tobacco Use  . Smoking status: Never Smoker  . Smokeless tobacco: Never Used  Substance and Sexual Activity  . Alcohol use: No    Alcohol/week: 0.0 oz    Comment: very rarely (1-2/year)  . Drug use: No  . Sexual activity: Yes  Lifestyle  . Physical activity:    Days per week: 0 days    Minutes per session: 0 min  . Stress: To some extent  Relationships  . Social connections:    Talks on phone: More than three times a week    Gets together: More than three times a week    Attends religious service: More than 4 times per year    Active member of club or organization: No    Attends meetings of clubs or organizations: Never    Relationship status: Married  Other Topics Concern  . Not on file  Social History Narrative   Lives at home with  wife, independent at baseline.    Outpatient Encounter Medications as of 03/27/2018  Medication Sig  . aspirin 81 MG EC tablet TAKE 1 TABLET(81 MG) BY MOUTH DAILY  . atorvastatin (LIPITOR) 80 MG tablet Take 1 tablet (80 mg total) by mouth daily.  . clopidogrel (PLAVIX) 75 MG tablet TAKE 1 TABLET(75 MG) BY MOUTH DAILY  . DULoxetine (CYMBALTA) 20 MG capsule   . glucose blood (FREESTYLE LITE) test strip Check blood sugars twice a day (Dx. 250.02)  . HUMALOG 100 UNIT/ML injection INJECT 4 TO 6 UNITS UNDER THE SKIN THREE TIMES DAILY  . insulin lispro (HUMALOG) 100 UNIT/ML injection Humalog U-100 Insulin 100 unit/mL subcutaneous solution  . INSULIN SYRINGE .5CC/29G 29G X 1/2" 0.5 ML MISC USE AS DIRECTED WITH INSULIN, 1 NEEDLE PER USE  . Insulin Syringe-Needle U-100 27G X 1/2" 1 ML MISC Use as directed with insulin. One needle per use.  Marland Kitchen LANTUS 100 UNIT/ML injection INJECT 0.15 ML(15 UNITS) INTO THE SKIN EVERY NIGHT AT  BEDTIME  . levothyroxine (SYNTHROID) 112 MCG tablet Take 1 tablet (112 mcg total) by mouth daily before breakfast.  . losartan (COZAAR) 50 MG tablet Take 1 tablet (50 mg total) by mouth daily.  . metFORMIN (GLUCOPHAGE) 500 MG tablet TAKE 1 TABLET BY MOUTH DAILY WITH BREAKFAST  . ONETOUCH DELICA LANCETS FINE MISC 1 applicator by Other route 4 (four) times daily.  . pregabalin (LYRICA) 100 MG capsule Take 1 capsule (100 mg total) by mouth 3 (three) times daily.  . [DISCONTINUED] LYRICA 100 MG capsule TAKE 1 CAPSULE BY MOUTH THREE TIMES DAILY   No facility-administered encounter medications on file as of 03/27/2018.     Review of Systems  Constitutional: Positive for fatigue. Negative for appetite change and unexpected weight change.  HENT: Negative for congestion and sinus pressure.   Respiratory: Negative for cough, chest tightness and shortness of breath.   Cardiovascular: Negative for chest pain, palpitations and leg swelling.  Gastrointestinal: Negative for abdominal pain, diarrhea, nausea and vomiting.  Genitourinary: Negative for difficulty urinating and dysuria.  Musculoskeletal: Negative for joint swelling and myalgias.  Skin: Negative for color change and rash.  Neurological: Negative for dizziness, light-headedness and headaches.  Psychiatric/Behavioral: Negative for agitation and dysphoric mood.       Objective:     Blood pressure rechecked by me:  128-130/78  Physical Exam  Constitutional: He appears well-developed and well-nourished. No distress.  HENT:  Nose: Nose normal.  Mouth/Throat: Oropharynx is clear and moist.  Neck: Neck supple.  Cardiovascular: Normal rate and regular rhythm.  Pulmonary/Chest: Effort normal and breath sounds normal. No respiratory distress.  Abdominal: Soft. Bowel sounds are normal. There is no tenderness.  Musculoskeletal: He exhibits no edema or tenderness.  Lymphadenopathy:    He has no cervical adenopathy.  Skin: No rash noted. No  erythema.  Psychiatric: He has a normal mood and affect. His behavior is normal.    BP 110/80   Pulse 84   Temp 97.8 F (36.6 C) (Oral)   Resp 17   Ht 6' (1.829 m)   Wt 278 lb (126.1 kg)   SpO2 97%   BMI 37.70 kg/m  Wt Readings from Last 3 Encounters:  03/27/18 278 lb (126.1 kg)  02/02/18 277 lb (125.6 kg)  01/27/18 272 lb (123.4 kg)     Lab Results  Component Value Date   WBC 5.9 01/05/2018   HGB 14.4 01/05/2018   HCT 41.8 01/05/2018   PLT 228 01/05/2018  GLUCOSE 116 (H) 01/05/2018   CHOL 98 01/07/2018   TRIG 137 01/07/2018   HDL 24 (L) 01/07/2018   LDLCALC 47 01/07/2018   ALT 24 01/05/2018   AST 27 01/05/2018   NA 138 01/05/2018   K 4.0 01/05/2018   CL 105 01/05/2018   CREATININE 1.18 01/05/2018   BUN 22 (H) 01/05/2018   CO2 28 01/05/2018   TSH 7.970 (H) 03/25/2017   INR 0.98 01/05/2018   HGBA1C 6.4 (H) 01/06/2018      Assessment & Plan:   Problem List Items Addressed This Visit    Daytime somnolence    Wakes up fatigued.  Increased daytime somnolence.  Shallow breathing when sleeping.  Recent CVA.  Concern over possible sleep apnea.  Refer to pulmonary for evaluation for possible sleep apnea and plans for sleep study.        Relevant Orders   Ambulatory referral to Pulmonology   Diabetes ([place])    States sugars have been doing well.  No low sugars.  Follow met b and a1c.  Low carb diet and exercise.        Essential hypertension    Blood pressure under good control.  Continue same medication regimen.  Follow pressures.  Follow metabolic panel.        History of CVA (cerebrovascular accident)    Has had extensive w/up.  Just saw vascular surgery.  Remains on aspirin and plavix.  Continue risk factor modification.  On high dose atorvastatin.  Keep sugar under good control.        Hyperlipidemia    On lipitor.  Low cholesterol diet and exercise.  Follow lipid panel and liver function tests.        Hypothyroidism    On thyroid replacement.  Follow  tsh.       Neuropathy    On lyrica.  Follow.            [name], MD

## 2018-03-30 ENCOUNTER — Encounter: Payer: Self-pay | Admitting: Internal Medicine

## 2018-03-30 DIAGNOSIS — R400 Somnolence: Secondary | ICD-10-CM | POA: Insufficient documentation

## 2018-03-30 NOTE — Assessment & Plan Note (Signed)
Blood pressure under good control.  Continue same medication regimen.  Follow pressures.  Follow metabolic panel.   

## 2018-03-30 NOTE — Assessment & Plan Note (Signed)
On lyrica.  Follow.

## 2018-03-30 NOTE — Assessment & Plan Note (Signed)
On lipitor.  Low cholesterol diet and exercise.  Follow lipid panel and liver function tests.   

## 2018-03-30 NOTE — Assessment & Plan Note (Signed)
Has had extensive w/up.  Just saw vascular surgery.  Remains on aspirin and plavix.  Continue risk factor modification.  On high dose atorvastatin.  Keep sugar under good control.

## 2018-03-30 NOTE — Assessment & Plan Note (Signed)
On thyroid replacement.  Follow tsh.  

## 2018-03-30 NOTE — Assessment & Plan Note (Signed)
Wakes up fatigued.  Increased daytime somnolence.  Shallow breathing when sleeping.  Recent CVA.  Concern over possible sleep apnea.  Refer to pulmonary for evaluation for possible sleep apnea and plans for sleep study.

## 2018-03-30 NOTE — Assessment & Plan Note (Signed)
States sugars have been doing well.  No low sugars.  Follow met b and a1c.  Low carb diet and exercise.

## 2018-04-01 ENCOUNTER — Other Ambulatory Visit: Payer: Self-pay | Admitting: Internal Medicine

## 2018-04-03 ENCOUNTER — Ambulatory Visit (INDEPENDENT_AMBULATORY_CARE_PROVIDER_SITE_OTHER): Payer: Managed Care, Other (non HMO) | Admitting: *Deleted

## 2018-04-03 DIAGNOSIS — I639 Cerebral infarction, unspecified: Secondary | ICD-10-CM

## 2018-04-03 NOTE — Progress Notes (Signed)
[facility] Summary Report / Loop Recorder 

## 2018-04-09 ENCOUNTER — Observation Stay
Admission: EM | Admit: 2018-04-09 | Discharge: 2018-04-10 | Disposition: A | Discharge status: Home / Self Care | Payer: Managed Care, Other (non HMO) | Attending: Internal Medicine | Admitting: Internal Medicine

## 2018-04-09 ENCOUNTER — Other Ambulatory Visit: Payer: Self-pay

## 2018-04-09 ENCOUNTER — Emergency Department: Payer: Managed Care, Other (non HMO)

## 2018-04-09 DIAGNOSIS — E039 Hypothyroidism, unspecified: Secondary | ICD-10-CM | POA: Diagnosis not present

## 2018-04-09 DIAGNOSIS — E119 Type 2 diabetes mellitus without complications: Secondary | ICD-10-CM | POA: Insufficient documentation

## 2018-04-09 DIAGNOSIS — Z8673 Personal history of transient ischemic attack (TIA), and cerebral infarction without residual deficits: Secondary | ICD-10-CM | POA: Diagnosis not present

## 2018-04-09 DIAGNOSIS — Z7902 Long term (current) use of antithrombotics/antiplatelets: Secondary | ICD-10-CM | POA: Diagnosis not present

## 2018-04-09 DIAGNOSIS — I10 Essential (primary) hypertension: Secondary | ICD-10-CM | POA: Insufficient documentation

## 2018-04-09 DIAGNOSIS — G510 Bell's palsy: Principal | ICD-10-CM | POA: Insufficient documentation

## 2018-04-09 DIAGNOSIS — Z79899 Other long term (current) drug therapy: Secondary | ICD-10-CM | POA: Insufficient documentation

## 2018-04-09 DIAGNOSIS — E785 Hyperlipidemia, unspecified: Secondary | ICD-10-CM | POA: Diagnosis not present

## 2018-04-09 DIAGNOSIS — E1140 Type 2 diabetes mellitus with diabetic neuropathy, unspecified: Secondary | ICD-10-CM | POA: Insufficient documentation

## 2018-04-09 DIAGNOSIS — Z794 Long term (current) use of insulin: Secondary | ICD-10-CM | POA: Insufficient documentation

## 2018-04-09 DIAGNOSIS — Z7982 Long term (current) use of aspirin: Secondary | ICD-10-CM | POA: Diagnosis not present

## 2018-04-09 DIAGNOSIS — R29810 Facial weakness: Secondary | ICD-10-CM | POA: Diagnosis present

## 2018-04-09 DIAGNOSIS — I639 Cerebral infarction, unspecified: Secondary | ICD-10-CM

## 2018-04-09 DIAGNOSIS — Z79890 Hormone replacement therapy (postmenopausal): Secondary | ICD-10-CM | POA: Diagnosis not present

## 2018-04-09 LAB — PROTIME-INR
INR: 0.85
Prothrombin Time: 11.5 seconds (ref 11.4–15.2)

## 2018-04-09 LAB — COMPREHENSIVE METABOLIC PANEL
ALT: 20 U/L (ref 0–44)
AST: 26 U/L (ref 15–41)
Albumin: 3.5 g/dL (ref 3.5–5.0)
Alkaline Phosphatase: 86 U/L (ref 38–126)
Anion gap: 5 (ref 5–15)
BUN: 20 mg/dL (ref 6–20)
CO2: 28 mmol/L (ref 22–32)
Calcium: 9.2 mg/dL (ref 8.9–10.3)
Chloride: 108 mmol/L (ref 98–111)
Creatinine, Ser: 1.11 mg/dL (ref 0.61–1.24)
GFR calc Af Amer: >60 mL/min (ref >60)
GFR calc non Af Amer: >60 mL/min (ref >60)
Glucose, Bld: 249 mg/dL — ABNORMAL HIGH (ref 70–99)
Potassium: 4 mmol/L (ref 3.5–5.1)
Sodium: 141 mmol/L (ref 135–145)
Total Bilirubin: 0.8 mg/dL (ref 0.3–1.2)
Total Protein: 6.8 g/dL (ref 6.5–8.1)

## 2018-04-09 LAB — CUP PACEART REMOTE DEVICE CHECK
Date Time Interrogation Session: 20190707120732
Implantable Pulse Generator Implant Date: 20190604

## 2018-04-09 LAB — DIFFERENTIAL
Basophils Absolute: 0 10*3/uL (ref 0–0.1)
Basophils Relative: 1 %
Eosinophils Absolute: 0.1 10*3/uL (ref 0–0.7)
Eosinophils Relative: 2 %
Lymphocytes Relative: 29 %
Lymphs Abs: 1.6 10*3/uL (ref 1.0–3.6)
Monocytes Absolute: 0.4 10*3/uL (ref 0.2–1.0)
Monocytes Relative: 6 %
Neutro Abs: 3.4 10*3/uL (ref 1.4–6.5)
Neutrophils Relative %: 62 %

## 2018-04-09 LAB — CBC
HCT: 40.8 % (ref 40.0–52.0)
Hemoglobin: 14.3 g/dL (ref 13.0–18.0)
MCH: 30.3 pg (ref 26.0–34.0)
MCHC: 35.1 g/dL (ref 32.0–36.0)
MCV: 86.5 fL (ref 80.0–100.0)
Platelets: 195 10*3/uL (ref 150–440)
RBC: 4.72 MIL/uL (ref 4.40–5.90)
RDW: 12.7 % (ref 11.5–14.5)
WBC: 5.6 10*3/uL (ref 3.8–10.6)

## 2018-04-09 LAB — APTT: aPTT: 32 seconds (ref 24–36)

## 2018-04-09 LAB — GLUCOSE, CAPILLARY: Glucose-Capillary: 258 mg/dL — ABNORMAL HIGH (ref 70–99)

## 2018-04-09 LAB — TROPONIN I: Troponin I: <0.03 ng/mL (ref <0.03)

## 2018-04-09 MED ORDER — ASPIRIN 81 MG PO CHEW
324.0000 mg | CHEWABLE_TABLET | Freq: Once | ORAL | Status: AC
  Administered 2018-04-09: 324 mg via ORAL
  Filled 2018-04-09: qty 4

## 2018-04-09 NOTE — ED Notes (Addendum)
Pt states he has a loop recorder that is MRI safe (is what pt was told).   Pt states that he had injection in L eye this morning at retina specialist. States has has these injections before.  Pt states previous strokes in January and May. States only symptom was facial droop then, denies any speech/visual changes, denies any weakness or numbness then. Denies changes in taste.

## 2018-04-09 NOTE — ED Notes (Signed)
Pt ambulatory to toilet with steady gait noted. Wife remains at bedside.

## 2018-04-09 NOTE — H&P (Signed)
[place] [facility] [place] NAME: Gregory Crane    MR#:  [id number]  DATE OF BIRTH:  1971-06-24  DATE OF ADMISSION:  04/09/2018  PRIMARY CARE PHYSICIAN: [name], MD   REQUESTING/REFERRING PHYSICIAN:   CHIEF COMPLAINT:   Chief Complaint  Patient presents with  . Facial Droop    HISTORY OF PRESENT ILLNESS: Gregory Crane  is a 47 y.o. male with a known history of diabetes type 2, hypertension, hyperlipidemia, recurrent strokes and Bell's palsy. Patient presented to emergency room for left facial droop noted by his wife, earlier today, around 4:30 PM.  No other neurologic symptoms.  Patient has had 3 prior strokes in the past year.  The main symptom of the strokes was always left facial droop, which will resolve completely within few days.  Per patient, he does not have a facial droop at baseline.  Patient states he is compliant with his medications. That is done emergency room, including troponin level, CBC and CMP are grossly unremarkable except for elevated glucose level at 249. CT scan of the head is negative for any acute intracranial abnormalities; prior stroke is noted. Patient is admitted for further evaluation and treatment.   PAST MEDICAL HISTORY:   Past Medical History:  Diagnosis Date  . Allergy   . Bell's palsy   . Diabetes mellitus without complication (HCC)    diet controlled  . Hypertension   . Hypothyroidism   . Kidney stones   . Stroke [facility])     PAST SURGICAL HISTORY:  Past Surgical History:  Procedure Laterality Date  . LOOP RECORDER INSERTION N/A 01/27/2018   Procedure: LOOP RECORDER INSERTION;  Surgeon: [name], MD;  Location: [facility];  Service: Cardiovascular;  Laterality: N/A;  . LUMBAR PUNCTURE     as child  . NO PAST SURGERIES    . TEE WITHOUT CARDIOVERSION N/A 01/07/2018   Procedure: TRANSESOPHAGEAL ECHOCARDIOGRAM (TEE);  Surgeon: Minna Merritts, MD;  Location: [facility];  Service:  Cardiovascular;  Laterality: N/A;    SOCIAL HISTORY:  Social History   Tobacco Use  . Smoking status: Never Smoker  . Smokeless tobacco: Never Used  Substance Use Topics  . Alcohol use: No    Alcohol/week: 0.0 standard drinks    Comment: very rarely (1-2/year)    FAMILY HISTORY:  Family History  Problem Relation Age of Onset  . Breast cancer Mother   . Diabetes Father   . Diabetes Sister   . Arthritis Maternal Grandmother   . Diabetes Maternal Grandmother     DRUG ALLERGIES: No Known Allergies  REVIEW OF SYSTEMS:   CONSTITUTIONAL: No fever, fatigue or weakness.  EYES: No changes in vision.  EARS, NOSE, AND THROAT: No tinnitus or ear pain.  RESPIRATORY: No cough, shortness of breath, wheezing or hemoptysis.  CARDIOVASCULAR: No chest pain, orthopnea, edema.  GASTROINTESTINAL: No nausea, vomiting, diarrhea or abdominal pain.  GENITOURINARY: No dysuria, hematuria.  ENDOCRINE: No polyuria, nocturia. HEMATOLOGY: No bleeding. SKIN: No rash or lesion. MUSCULOSKELETAL: No joint pain at this time.   NEUROLOGIC: Positive for left facial droop.  No focal weakness.  PSYCHIATRY: No anxiety or depression.   MEDICATIONS AT HOME:  Prior to Admission medications   Medication Sig Start Date End Date Taking? Authorizing Provider  aspirin 81 MG EC tablet TAKE 1 TABLET(81 MG) BY MOUTH DAILY 02/04/18  Yes [name], MD  atorvastatin (LIPITOR) 80 MG tablet Take 1 tablet (80 mg total) by  mouth daily. 01/22/18 04/22/18 Yes [name], MD  clopidogrel (PLAVIX) 75 MG tablet TAKE 1 TABLET(75 MG) BY MOUTH DAILY 02/04/18  Yes [name], MD  DULoxetine (CYMBALTA) 20 MG capsule Take 20 mg by mouth daily.  01/09/18  Yes [provider]  HUMALOG 100 UNIT/ML injection INJECT 4 TO 6 UNITS UNDER THE SKIN THREE TIMES DAILY 03/06/18  Yes [name], MD  LANTUS 100 UNIT/ML injection INJECT 0.15 ML(15 UNITS) INTO THE SKIN EVERY NIGHT AT BEDTIME 01/15/18  Yes [name], MD   levothyroxine (SYNTHROID) 112 MCG tablet Take 1 tablet (112 mcg total) by mouth daily before breakfast. 10/24/17  Yes [name], MD  losartan (COZAAR) 50 MG tablet Take 1 tablet (50 mg total) by mouth daily. Patient taking differently: Take 25 mg by mouth daily.  10/27/17  Yes [name], MD  metFORMIN (GLUCOPHAGE) 500 MG tablet TAKE 1 TABLET BY MOUTH DAILY WITH BREAKFAST 02/02/18  Yes [name], MD  pregabalin (LYRICA) 100 MG capsule Take 1 capsule (100 mg total) by mouth 3 (three) times daily. 03/27/18  Yes [name], MD  glucose blood (FREESTYLE LITE) test strip Check blood sugars twice a day (Dx. 250.02) 04/18/17   [name], MD  INSULIN SYRINGE .5CC/29G 29G X 1/2" 0.5 ML MISC USE AS DIRECTED 04/02/18   [name], MD  Insulin Syringe-Needle U-100 27G X 1/2" 1 ML MISC Use as directed with insulin. One needle per use. 12/01/17   [name], MD  [facility] DELICA LANCETS FINE MISC 1 applicator by Other route 4 (four) times daily. 10/06/17   [provider]      PHYSICAL EXAMINATION:   VITAL SIGNS: Blood pressure (!) 153/61, pulse 67, temperature (!) 97.4 F (36.3 C), resp. rate (!) 9, height 6' (1.829 m), weight 122.5 kg, SpO2 97 %.  GENERAL:  47 y.o.-year-old patient lying in the bed with no acute distress.  EYES: Pupils equal, round, reactive to light and accommodation. No scleral icterus. Extraocular muscles intact.  HEENT: Head atraumatic, normocephalic. Oropharynx and nasopharynx clear.  NECK:  Supple, no jugular venous distention. No thyroid enlargement, no tenderness.  LUNGS: Normal breath sounds bilaterally, no wheezing, rales,rhonchi or crepitation. No use of accessory muscles of respiration.  CARDIOVASCULAR: S1, S2 normal. No S3/S4.  ABDOMEN: Soft, nontender, nondistended. Bowel sounds present. No organomegaly or mass.  EXTREMITIES: No pedal edema, cyanosis, or clubbing.  NEUROLOGIC: Normal speech and language.  Mild left facial droop is noted.   Muscle strength 5/5 in all extremities. Sensation intact.  Gait is stable. PSYCHIATRIC: The patient is alert and oriented x 3.  SKIN: No obvious rash, lesion, or ulcer.   LABORATORY PANEL:   CBC Recent Labs  Lab 04/09/18 2031  WBC 5.6  HGB 14.3  HCT 40.8  PLT 195  MCV 86.5  MCH 30.3  MCHC 35.1  RDW 12.7  LYMPHSABS 1.6  MONOABS 0.4  EOSABS 0.1  BASOSABS 0.0   ------------------------------------------------------------------------------------------------------------------  Chemistries  Recent Labs  Lab 04/09/18 2031  NA 141  K 4.0  CL 108  CO2 28  GLUCOSE 249*  BUN 20  CREATININE 1.11  CALCIUM 9.2  AST 26  ALT 20  ALKPHOS 86  BILITOT 0.8   ------------------------------------------------------------------------------------------------------------------ estimated creatinine clearance is 111.2 mL/min (by C-G formula based on SCr of 1.11 mg/dL). ------------------------------------------------------------------------------------------------------------------ No results for input(s): TSH, T4TOTAL, T3FREE, THYROIDAB in the last 72 hours.  Invalid input(s): FREET3   Coagulation profile Recent Labs  Lab 04/09/18 2031  INR 0.85   -------------------------------------------------------------------------------------------------------------------  No results for input(s): DDIMER in the last 72 hours. -------------------------------------------------------------------------------------------------------------------  Cardiac Enzymes Recent Labs  Lab 04/09/18 2031  TROPONINI <0.03   ------------------------------------------------------------------------------------------------------------------ Invalid input(s): POCBNP  ---------------------------------------------------------------------------------------------------------------  Urinalysis    Component Value Date/Time   COLORURINE YELLOW (A) 09/12/2017 1722   APPEARANCEUR HAZY (A) 09/12/2017 1722    APPEARANCEUR Clear 01/27/2014 0247   LABSPEC 1.020 09/12/2017 1722   LABSPEC 1.033 01/27/2014 0247   PHURINE 5.0 09/12/2017 1722   GLUCOSEU >=500 (A) 09/12/2017 1722   GLUCOSEU >=500 01/27/2014 0247   HGBUR SMALL (A) 09/12/2017 1722   BILIRUBINUR NEGATIVE 09/12/2017 1722   BILIRUBINUR Negative 01/27/2014 0247   KETONESUR 20 (A) 09/12/2017 1722   PROTEINUR 100 (A) 09/12/2017 1722   NITRITE NEGATIVE 09/12/2017 1722   LEUKOCYTESUR NEGATIVE 09/12/2017 1722   LEUKOCYTESUR Negative 01/27/2014 0247     RADIOLOGY: Ct Head Code Stroke Wo Contrast  Result Date: 04/09/2018 CLINICAL DATA:  Code stroke.  New onset left-sided facial droop. EXAM: CT HEAD WITHOUT CONTRAST TECHNIQUE: Contiguous axial images were obtained from the base of the skull through the vertex without intravenous contrast. COMPARISON:  01/06/2018 FINDINGS: Brain: There is no mass, hemorrhage or extra-axial collection. The size and configuration of the ventricles and extra-axial CSF spaces are normal. Old right basal ganglia lacunar infarct. Brain parenchyma is otherwise normal. Vascular: No abnormal hyperdensity of the major intracranial arteries or dural venous sinuses. No intracranial atherosclerosis. Skull: The visualized skull base, calvarium and extracranial soft tissues are normal. Sinuses/Orbits: No fluid levels or advanced mucosal thickening of the visualized paranasal sinuses. No mastoid or middle ear effusion. The orbits are normal. ASPECTS ([place] Stroke Program Early CT Score) - Ganglionic level infarction (caudate, lentiform nuclei, internal capsule, insula, M1-M3 cortex): 7 - Supraganglionic infarction (M4-M6 cortex): 3 Total score (0-10 with 10 being normal): 10 Review of the MIP images confirms the above findings IMPRESSION: 1. No hemorrhage or mass effect. 2. Old right basal ganglia lacunar infarct. 3. ASPECTS is 10 These results were called by telephone at the time of interpretation on 04/09/2018 at 8:49 pm to Dr. Harvest Dark , who verbally acknowledged these results. Electronically Signed   By: Ulyses Jarred M.D.   On: 04/09/2018 20:50    EKG: Orders placed or performed during the hospital encounter of 04/09/18  . ED EKG  . ED EKG    IMPRESSION AND PLAN:  1.  Acute, recurrent left facial droop, concerning for TIA/CVA.  Will check brain MRI.  We will continue aspirin, Plavix and high-dose statin therapy.  We will hold blood pressure medications to allow permissive hypertension.  Neurology is consulted for further evaluation and treatment.  Will have PT/OT/ST evaluate the patient. 2.  Hypertension, as mentioned above, will keep systolic blood pressure around 160, to allow permissive hypertension for now. 3.  Hyperlipidemia, continue high-dose statin therapy. 4.  Diabetes type 2.  Will start low-carb diet and monitor blood sugars before meals and at bedtime.  Will use insulin treatment during the hospital stay.  All the records are reviewed and case discussed with ED provider. Management plans discussed with the patient, family and they are in agreement.  CODE STATUS: Full Code Status History    Date Active Date Inactive Code Status Order ID Comments User Context   01/06/2018 0423 01/07/2018 1635 Full Code [id number]  Arta Silence, MD Inpatient   09/12/2017 2347 09/14/2017 1444 Full Code [id number]  Amelia Jo, MD ED   10/20/2016 2044 10/21/2016 1529 Full Code [id number]  Tressia Miners,  Hart Rochester, MD Inpatient       TOTAL TIME TAKING CARE OF THIS PATIENT: 45 minutes.    Amelia Jo M.D on 04/09/2018 at 10:14 PM  Between 7am to 6pm - Pager - [phone]  After 6pm go to [email or web address] - password [email or web address] [facility] [place] [facility]  [phone]  CC: Primary care physician; [name], MD

## 2018-04-09 NOTE — ED Provider Notes (Signed)
[facility] Emergency Department Provider Note  Time seen: 8:41 PM  I have reviewed the triage vital signs and the nursing notes.   HISTORY  Chief Complaint Facial Droop    HPI Gregory Crane is a 47 y.o. male with a past medical history of CVA x3, hypertension, diabetes, presents the emergency department the left facial droop.  According to the patient around 4:30 PM today the wife stated that the left side of his face looked to be drooping.  Patient has a history of 3 strokes previously.  The main symptom of each stroke has been a left facial droop however patient states that has completely resolved and he has no facial droop at baseline.  Wife confirms no facial droop normally and significant droop today.  Denies any headache, confusion, difficulty speaking or thinking.  No focal weakness or numbness although he states a feeling of generalized fatigue today.   Past Medical History:  Diagnosis Date  . Allergy   . Bell's palsy   . Diabetes mellitus without complication (HCC)    diet controlled  . Hypertension   . Hypothyroidism   . Kidney stones   . Stroke [facility])     Patient Active Problem List   Diagnosis Date Noted  . Daytime somnolence 03/30/2018  . Carotid stenosis 02/03/2018  . Acute CVA (cerebrovascular accident) ([place]) 01/06/2018  . Intracranial vascular stenosis 09/12/2017  . Cough 01/20/2017  . Bell's palsy 11/10/2016  . History of CVA (cerebrovascular accident) 11/10/2016  . 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 History of nephrolithiasis 10/27/2016  . TIA (transient ischemic attack) 10/20/2016  . Near syncope 06/23/2016  . 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 Neuropathy 08/06/2015  . Health care maintenance 08/06/2015  . Essential hypertension 08/06/2015  . Heme positive stool 10/10/2013  . Hypothyroidism 10/10/2013  . Microalbuminuria 10/10/2013  . Hyperlipidemia 10/10/2013  . B12 deficiency  10/10/2013  . Diabetes ([place]) 07/04/2013  . Environmental allergies 07/04/2013    Past Surgical History:  Procedure Laterality Date  . LOOP RECORDER INSERTION N/A 01/27/2018   Procedure: LOOP RECORDER INSERTION;  Surgeon: [name], MD;  Location: [place] [facility];  Service: Cardiovascular;  Laterality: N/A;  . LUMBAR PUNCTURE     as child  . NO PAST SURGERIES    . TEE WITHOUT CARDIOVERSION N/A 01/07/2018   Procedure: TRANSESOPHAGEAL ECHOCARDIOGRAM (TEE);  Surgeon: Minna Merritts, MD;  Location: [facility] ORS;  Service: Cardiovascular;  Laterality: N/A;    Prior to Admission medications   Medication Sig Start Date End Date Taking? Authorizing Provider  aspirin 81 MG EC tablet TAKE 1 TABLET(81 MG) BY MOUTH DAILY 02/04/18  Yes [name], MD  atorvastatin (LIPITOR) 80 MG tablet Take 1 tablet (80 mg total) by mouth daily. 01/22/18 04/22/18 Yes [name], MD  clopidogrel (PLAVIX) 75 MG tablet TAKE 1 TABLET(75 MG) BY MOUTH DAILY 02/04/18  Yes [name], MD  HUMALOG 100 UNIT/ML injection INJECT 4 TO 6 UNITS UNDER THE SKIN THREE TIMES DAILY 03/06/18  Yes [name], MD  LANTUS 100 UNIT/ML injection INJECT 0.15 ML(15 UNITS) INTO THE SKIN EVERY NIGHT AT BEDTIME 01/15/18  Yes [name], MD  levothyroxine (SYNTHROID) 112 MCG tablet Take 1 tablet (112 mcg total) by mouth daily before breakfast. 10/24/17  Yes [name], MD  losartan (COZAAR) 50 MG tablet Take 1 tablet (50 mg total) by mouth daily. Patient taking differently: Take 25 mg by mouth daily.  10/27/17  Yes [name], MD  metFORMIN (GLUCOPHAGE) 500 MG tablet TAKE 1 TABLET BY MOUTH DAILY WITH BREAKFAST 02/02/18  Yes [name], MD  pregabalin (LYRICA) 100 MG capsule Take 1 capsule (100 mg total) by mouth 3 (three) times daily. 03/27/18  Yes [name], MD  DULoxetine (CYMBALTA) 20 MG capsule  01/09/18   [provider]  glucose blood (FREESTYLE LITE) test strip Check blood sugars twice a day  (Dx. 250.02) 04/18/17   [name], MD  insulin lispro (HUMALOG) 100 UNIT/ML injection Humalog U-100 Insulin 100 unit/mL subcutaneous solution    [provider]  INSULIN SYRINGE .5CC/29G 29G X 1/2" 0.5 ML MISC USE AS DIRECTED 04/02/18   [name], MD  Insulin Syringe-Needle U-100 27G X 1/2" 1 ML MISC Use as directed with insulin. One needle per use. 12/01/17   [name], MD  [facility] DELICA LANCETS FINE MISC 1 applicator by Other route 4 (four) times daily. 10/06/17   [provider]    No Known Allergies  Family History  Problem Relation Age of Onset  . Breast cancer Mother   . Diabetes Father   . Diabetes Sister   . Arthritis Maternal Grandmother   . Diabetes Maternal Grandmother     Social History Social History   Tobacco Use  . Smoking status: Never Smoker  . Smokeless tobacco: Never Used  Substance Use Topics  . Alcohol use: No    Alcohol/week: 0.0 standard drinks    Comment: very rarely (1-2/year)  . Drug use: No    Review of Systems Constitutional: Negative for fever. Eyes: Patient has a left subconjunctival hemorrhage he states he is currently getting retinal injections by his ophthalmologist ENT: Negative for recent illness/congestion Cardiovascular: Negative for chest pain. Respiratory: Negative for shortness of breath. Gastrointestinal: Negative for abdominal pain, vomiting and diarrhea. Genitourinary: Negative for urinary compaints Musculoskeletal: Negative for musculoskeletal complaints Skin: Negative for skin complaints  Neurological: Negative for headache.  Left facial droop. All other ROS negative  ____________________________________________   PHYSICAL EXAM:  VITAL SIGNS: ED Triage Vitals  Enc Vitals Group     BP --      Pulse --      Resp --      Temp --      Temp src --      SpO2 --      Weight 04/09/18 2041 270 lb (122.5 kg)     Height 04/09/18 2041 6' (1.829 m)     Head Circumference --      Peak Flow --       Pain Score 04/09/18 2040 0     Pain Loc --      Pain Edu? --      Excl. in [place]? --    Constitutional: Alert and oriented. Well appearing and in no distress. Eyes: Small left subconjunctival hemorrhage ENT   Head: Normocephalic and atraumatic   Mouth/Throat: Mucous membranes are moist. Cardiovascular: Normal rate, regular rhythm. No murmur Respiratory: Normal respiratory effort without tachypnea nor retractions. Breath sounds are clear Gastrointestinal: Soft and nontender. No distention.   Musculoskeletal: Nontender with normal range of motion in all extremities.  Neurologic:  Normal speech and language.  Patient has a moderate left facial droop, sensation intact and equal bilaterally.  Equal grip strength, no pronator drift, no lower extremity drift.  5/5 motor in all extremities. Skin:  Skin is warm, dry and intact.  Psychiatric: Mood and affect are normal  ____________________________________________    EKG  EKG reviewed and interpreted by myself shows normal sinus rhythm at 78 bpm with a narrow QRS, normal axis, normal intervals besides PR prolongation consistent with first-degree AV block, nonspecific ST changes without ST elevation.  ____________________________________________    RADIOLOGY  CT negative for acute infarct  ____________________________________________   INITIAL IMPRESSION / ASSESSMENT AND PLAN / ED COURSE  Pertinent labs & imaging results that were available during my care of the patient were reviewed by me and considered in my medical decision making (see chart for details).  Patient presents to the emergency department with acute onset of left facial droop beginning around 4:30 PM per wife.  Patient arrives as a code stroke.  Differential would include CVA, Bell's palsy, exacerbation of prior CVA.  I reviewed the patient's head CT I do not see any obvious bleed or significant CVA on my review awaiting radiology read.  We will activate code  stroke telemetry neurologist to evaluate.  As the patient's only symptom is a facial droop and this has occurred with prior strokes I would be hesitant to use TPA but would appreciate specialist recommendations.  CT negative for acute infarct.   NIH Stroke Scale   Interval: Baseline Time: 8:44 PM Person Administering Scale: Harvest Dark  Administer stroke scale items in the order listed. Record performance in each category after each subscale exam. Do not go back and change scores. Follow directions provided for each exam technique. Scores should reflect what the patient does, not what the clinician thinks the patient can do. The clinician should record answers while administering the exam and work quickly. Except where indicated, the patient should not be coached (i.e., repeated requests to patient to make a special effort).   1a  Level of consciousness: 0=alert; keenly responsive  1b. LOC questions:  0=Performs both tasks correctly  1c. LOC commands: 0=Performs both tasks correctly  2.  Best Gaze: 0=normal  3.  Visual: 0=No visual loss  4. Facial Palsy: 2=Partial paralysis (total or near total paralysis of the lower face)  5a.  Motor left arm: 0=No drift, limb holds 90 (or 45) degrees for full 10 seconds  5b.  Motor right arm: 0=No drift, limb holds 90 (or 45) degrees for full 10 seconds  6a. motor left leg: 0=No drift, limb holds 90 (or 45) degrees for full 10 seconds  6b  Motor right leg:  0=No drift, limb holds 90 (or 45) degrees for full 10 seconds  7. Limb Ataxia: 0=Absent  8.  Sensory: 0=Normal; no sensory loss  9. Best Language:  0=No aphasia, normal  10. Dysarthria: 0=Normal  11. Extinction and Inattention: 0=No abnormality  12. Distal motor function: 0=Normal   Total:   2   Patient has been evaluated by neurology.  They recommend admission to the hospital for stroke work-up.  Patient agreeable to plan of care.  We will dose full-strength aspirin and admit to the  hospitalist service.  CT scan of the head was negative for acute infarct.  Labs are largely at baseline. ____________________________________________   FINAL CLINICAL IMPRESSION(S) / ED DIAGNOSES  Left facial droop CVA   Harvest Dark, MD 04/09/18 2129

## 2018-04-09 NOTE — ED Triage Notes (Addendum)
Pt arrives to ED with family member who noticed about 2 hrs PTA L sided facial droop. Hx of stroke. Denies any deficits from previous strokes. Wearing glasses. Pt states L eye is not new today for him. Takes plavix, asa.   Moving all extremities on own. Speaking in complete sentences. No blurred vision per pt. Denies numbness but states "general weakness." states hasn't felt well today in general. Last stroke in May 2019.

## 2018-04-09 NOTE — ED Notes (Addendum)
Pt states he took his ASA today already, 81mg .

## 2018-04-09 NOTE — Consult Note (Signed)
Date of Service 04/09/2018  [facility] TeleNeurology Consult Services  Comments: Last time known well:  _ 18:30 Door time:  _2023 [facility] contacted: _2038 [facility] at bedside: _2043 NIHSS assessment time: _2045 consult end time: _20:57 --------------------------------------------------------------------------- Impression:  left facial droop concerning for Acute Ischemic Stroke vs Post-stroke Recrudescence vs bells palsy  Does (not) meet Large Vessel Occlusion (LVO) screening criteria (Aphasia, Neglect, Gaze deviation/preference, Dense hemiparesis, or Visual field deficits on exam), therefore advanced imaging (CTA head and neck and CTP brain) is (not) indicated.   Differential Diagnosis:  1. Cardioembolic stroke  2. Small vessel disease/ lacune  3. Thromboembolic, artery-to-artery mechanism  4. Hypercoagulable state-related infarct  5. Transient ischemic attack  6. Thrombotic mechanism, large artery disease   ------------------------------------------------------------------------------------------- tPA decision and other recommendations:   _  Patient is not a tPA candidate Head CT did not show any acute hemorrhage. reviewed report (if available) and images  Reason: _ NIHSS 1, symptoms too mild and not disabling, the risks of IV tPA would outweigh the potential benefits. Patient is not a NIR candidate: _ Thrombectomy not considered since large proximal intracranial vessel occlusion is not suspected.  Recommendations dysphagia screen ASA if no contraindications head of bed flat IV fluids NS Stroke work up with: noncontrast brain MRI (if loop recorder is compatible), head and neck MRA (or CTA), 2D ECHO, lipid panel, HbA1c (Goal LDL<70, HbA1c<7)  Check for hypercoagulable states with factor V Leiden, protein C, Protein S, Antithrombin assay, Antiphospholipid syndrome, Homocysteine, Prothrombin gene mutation   inpatient neurology consultation Inpatient  stroke evaluation as per Neurology/ Internal Medicine Discussed with ED physician/medical staff Please contact [employer] Navigator to reach me if further questions/concerns arise.  -------------------------------------------------------------------------------------------- Reason for Stroke Alert and History of Present Illness:  _ Patient is a(n) 47 years old male, with history of multiple strokes (on aspirin and Plavix) last 01/06/18, history of bells palsy on the right. last known well: 18:30 developed left facial droop  ---------------------------------------------------------------------------------------------- Review of Systems:  Constitutional:  Negative except as documented in history of present illness.   Eye:  Negative except as documented in history of present illness.   Ear/Nose/Mouth/Throat:  Negative except as documented in history of present illness.   Respiratory:  Negative except as documented in history of present illness.   Cardiovascular:  Negative except as documented in history of present illness.   Gastrointestinal:  Negative except as documented in history of present illness.   Musculoskeletal:  Negative except as documented in history of present illness.   Neurologic:  Negative except as documented in history of present illness.    --------------------------------------------------------------------------------------------------- Examination:   NIHSS Details documented in the note ___  # NIHSS - NIH STROKE SCALE - 11 items:  1A: Level of consciousness: 0 - Alert; keenly responsive.  1B: Ask month and age: 22 - Both questions right  1C: 'Blink eyes' & 'squeeze hands': 0 - Performs both tasks  2: Horizontal extraocular movements: 0 - Normal.  3: Visual fields: 0 - No visual loss.  4: Facial palsy: 1 - Minor paralysis (flat nasolabial fold, smile asymmetry).  5A: Left arm motor drift: 0 - No drift for 10 seconds, or Amputation/joint fusion.  5B:  Right arm motor drift: 0 - No drift for 10 seconds, or Amputation/joint fusion.  6A: Left leg motor drift: 0 - No drift for 5 seconds, or Amputation/joint fusion.  6B: Right leg motor drift: 0 - No drift for 5 seconds, or Amputation/joint fusion.  7: Limb Ataxia: 0 -  No ataxia, or does not understand, or Paralyzed, or Amputation/joint fusion.  8: Sensation: 0 - Normal; no sensory loss.  9: Language/aphasia: 0 - Normal; no aphasia.  10: Dysarthria: 0 - Normal, or Intubated/unable to test  11: Extinction/inattention: 0 - No abnormality   == Score: 1.  ------------------------------------------------------------------------------------------------------- Medical Decision Making:  - Extensive number of diagnosis or management options are considered above.   - Extensive amount of complex data reviewed.   - High risk of complication and/or morbidity or mortality are associated with differential diagnostic considerations above.  - There may be Uncertain outcome and increased probability of prolonged functional impairment or high probability of severe prolonged functional impairment associated with some of these differential diagnoses.  Medical Data Reviewed:  1.Data reviewed include clinical labs, radiology, Medical Tests;   2.Tests results discussed w/performing or interpreting physician;   3.Obtaining/reviewing old medical records;  4.Obtaining case history from another source;  5.Independent review of image, tracing or specimen.    When possible Patient/family were informed the Neurology Consult would happen via TeleHealth consult by way of interactive audio and video telecommunications and consented to receiving care in this manner.  Case discussed with the Medical staff.  Critical Care notation:  I was called to see this critical patient emergently. I personally evaluated this critical patient for acute stroke evaluation and determining their eligibility for IV Alteplase and  interventional therapies. I have spent approximately _14_ minutes with the patient, including time at bedside, time discussing the case with other physicians, reviewing plan of care, and time independently reviewing the records and scans.

## 2018-04-09 NOTE — ED Notes (Signed)
Called code stroke to 333 at 2030 and activated telecart at 2036

## 2018-04-09 NOTE — ED Notes (Signed)
Attempted to call report. Was informed that receiving RN is on the phone. Gave name and number and awaiting return call.

## 2018-04-09 NOTE — ED Notes (Signed)
Pt assessed by Dr. Kerman Passey.  Patient transported to CT.

## 2018-04-09 NOTE — Progress Notes (Signed)
CODE STROKE- PHARMACY COMMUNICATION   Time CODE STROKE called/page received:@ 0832  Time response to CODE STROKE was made (in person or via phone): @0845  Time Stroke Kit retrieved from [employer] (only if needed):NA  Name of Provider/Nurse contacted:KATE  Past Medical History:  Diagnosis Date  . Allergy   . Bell's palsy   . Diabetes mellitus without complication (HCC)    diet controlled  . Hypertension   . Hypothyroidism   . Kidney stones   . Stroke (HCC)    Prior to Admission medications   Medication Sig Start Date End Date Taking? Authorizing Provider  aspirin 81 MG EC tablet TAKE 1 TABLET(81 MG) BY MOUTH DAILY 02/04/18  Yes [name], [name], MD  atorvastatin (LIPITOR) 80 MG tablet Take 1 tablet (80 mg total) by mouth daily. 01/22/18 04/22/18 Yes Klein, Steven C, MD  clopidogrel (PLAVIX) 75 MG tablet TAKE 1 TABLET(75 MG) BY MOUTH DAILY 02/04/18  Yes [name], [name], MD  DULoxetine (CYMBALTA) 20 MG capsule Take 20 mg by mouth daily.  01/09/18  Yes [provider]  HUMALOG 100 UNIT/ML injection INJECT 4 TO 6 UNITS UNDER THE SKIN THREE TIMES DAILY 03/06/18  Yes [name], [name], MD  LANTUS 100 UNIT/ML injection INJECT 0.15 ML(15 UNITS) INTO THE SKIN EVERY NIGHT AT BEDTIME 01/15/18  Yes [name], [name], MD  levothyroxine (SYNTHROID) 112 MCG tablet Take 1 tablet (112 mcg total) by mouth daily before breakfast. 10/24/17  Yes [name], [name], MD  losartan (COZAAR) 50 MG tablet Take 1 tablet (50 mg total) by mouth daily. Patient taking differently: Take 25 mg by mouth daily.  10/27/17  Yes [name], [name], MD  metFORMIN (GLUCOPHAGE) 500 MG tablet TAKE 1 TABLET BY MOUTH DAILY WITH BREAKFAST 02/02/18  Yes [name], [name], MD  pregabalin (LYRICA) 100 MG capsule Take 1 capsule (100 mg total) by mouth 3 (three) times daily. 03/27/18  Yes [name], [name], MD  glucose blood (FREESTYLE LITE) test strip Check blood sugars twice a day (Dx. 250.02) 04/18/17   [name], [name], MD  INSULIN SYRINGE .5CC/29G 29G  X 1/2" 0.5 ML MISC USE AS DIRECTED 04/02/18   [name], [name], MD  Insulin Syringe-Needle U-100 27G X 1/2" 1 ML MISC Use as directed with insulin. One needle per use. 12/01/17   [name], [name], MD  ONETOUCH DELICA LANCETS FINE MISC 1 applicator by Other route 4 (four) times daily. 10/06/17   [provider]  insulin lispro (HUMALOG) 100 UNIT/ML injection Humalog U-100 Insulin 100 unit/mL subcutaneous solution  04/09/18  [provider]  LYRICA 100 MG capsule Take 1 capsule by mouth 3 (three) times daily. 03/30/18 04/09/18  [provider]    Sheema M Hallaji ,PharmD Clinical Pharmacist  04/09/2018  8:48 PM   

## 2018-04-09 NOTE — Progress Notes (Signed)
   04/09/18 2030  Clinical Encounter Type  Visited With Family  Visit Type Code   Patient in CT scan, chaplain engaged spouse.  She reported that they had 'done this before' and had no need for a chaplain at this time.  Chaplain asked if there was anything [name] needed before chaplain left.  Care team came back into room with patient at that point and patient's spouse said no.  Chaplain encouraged spouse to have chaplain paged as needed; chaplain then followed up with unit staff regarding encounter.

## 2018-04-10 ENCOUNTER — Encounter: Payer: Self-pay | Admitting: *Deleted

## 2018-04-10 ENCOUNTER — Other Ambulatory Visit: Payer: Self-pay

## 2018-04-10 ENCOUNTER — Inpatient Hospital Stay: Payer: Managed Care, Other (non HMO)

## 2018-04-10 DIAGNOSIS — R29810 Facial weakness: Secondary | ICD-10-CM

## 2018-04-10 LAB — GLUCOSE, CAPILLARY
Glucose-Capillary: 113 mg/dL — ABNORMAL HIGH (ref 70–99)
Glucose-Capillary: 129 mg/dL — ABNORMAL HIGH (ref 70–99)
Glucose-Capillary: 144 mg/dL — ABNORMAL HIGH (ref 70–99)
Glucose-Capillary: 161 mg/dL — ABNORMAL HIGH (ref 70–99)
Glucose-Capillary: 162 mg/dL — ABNORMAL HIGH (ref 70–99)

## 2018-04-10 LAB — LIPID PANEL
Cholesterol: 103 mg/dL (ref 0–200)
HDL: 29 mg/dL — ABNORMAL LOW (ref >40)
LDL Cholesterol: 51 mg/dL (ref 0–99)
Total CHOL/HDL Ratio: 3.6 RATIO
Triglycerides: 113 mg/dL (ref <150)
VLDL: 23 mg/dL (ref 0–40)

## 2018-04-10 LAB — HEMOGLOBIN A1C
Hgb A1c MFr Bld: 7 % — ABNORMAL HIGH (ref 4.8–5.6)
Mean Plasma Glucose: 154.2 mg/dL

## 2018-04-10 MED ORDER — VALACYCLOVIR HCL 500 MG PO TABS
1000.0000 mg | ORAL_TABLET | Freq: Three times a day (TID) | ORAL | Status: DC
  Administered 2018-04-10: 1000 mg via ORAL
  Filled 2018-04-10 (×2): qty 2

## 2018-04-10 MED ORDER — VALACYCLOVIR HCL 1 G PO TABS: 1000.0000 mg | ORAL_TABLET | Freq: Three times a day (TID) | ORAL | 0 refills | Status: DC

## 2018-04-10 MED ORDER — INSULIN ASPART 100 UNIT/ML ~~LOC~~ SOLN
0.0000 [IU] | Freq: Three times a day (TID) | SUBCUTANEOUS | Status: DC
  Administered 2018-04-10 (×2): 3 [IU] via SUBCUTANEOUS
  Filled 2018-04-10 (×3): qty 1

## 2018-04-10 MED ORDER — ACETAMINOPHEN 650 MG RE SUPP: 650.0000 mg | RECTAL | Status: DC | PRN

## 2018-04-10 MED ORDER — ACETAMINOPHEN 325 MG PO TABS: 650.0000 mg | ORAL_TABLET | ORAL | Status: DC | PRN

## 2018-04-10 MED ORDER — LEVOTHYROXINE SODIUM 112 MCG PO TABS
112.0000 ug | ORAL_TABLET | Freq: Every day | ORAL | Status: DC
  Administered 2018-04-10: 112 ug via ORAL
  Filled 2018-04-10: qty 1

## 2018-04-10 MED ORDER — CLOPIDOGREL BISULFATE 75 MG PO TABS
75.0000 mg | ORAL_TABLET | Freq: Every day | ORAL | Status: DC
  Administered 2018-04-10: 75 mg via ORAL
  Filled 2018-04-10: qty 1

## 2018-04-10 MED ORDER — DULOXETINE HCL 20 MG PO CPEP
20.0000 mg | ORAL_CAPSULE | Freq: Every day | ORAL | Status: DC
  Administered 2018-04-10: 11:00:00 20 mg via ORAL
  Filled 2018-04-10: qty 1

## 2018-04-10 MED ORDER — HEPARIN SODIUM (PORCINE) 5000 UNIT/ML IJ SOLN
5000.0000 [IU] | Freq: Three times a day (TID) | INTRAMUSCULAR | Status: DC
  Administered 2018-04-10 (×2): 5000 [IU] via SUBCUTANEOUS
  Filled 2018-04-10 (×2): qty 1

## 2018-04-10 MED ORDER — SODIUM CHLORIDE 0.9 % IV SOLN
Freq: Once | INTRAVENOUS | Status: AC
  Administered 2018-04-10: 01:00:00 via INTRAVENOUS

## 2018-04-10 MED ORDER — PREDNISONE 20 MG PO TABS: 60.0000 mg | ORAL_TABLET | Freq: Every day | ORAL | 0 refills | Status: DC

## 2018-04-10 MED ORDER — ENOXAPARIN SODIUM 40 MG/0.4ML ~~LOC~~ SOLN: 40.0000 mg | SUBCUTANEOUS | Status: DC

## 2018-04-10 MED ORDER — INSULIN ASPART 100 UNIT/ML ~~LOC~~ SOLN: 0.0000 [IU] | Freq: Every day | SUBCUTANEOUS | Status: DC

## 2018-04-10 MED ORDER — INSULIN GLARGINE 100 UNIT/ML ~~LOC~~ SOLN
12.0000 [IU] | Freq: Every day | SUBCUTANEOUS | Status: DC
  Administered 2018-04-10: 12 [IU] via SUBCUTANEOUS
  Filled 2018-04-10 (×2): qty 0.12

## 2018-04-10 MED ORDER — PREDNISONE 50 MG PO TABS
60.0000 mg | ORAL_TABLET | Freq: Every day | ORAL | Status: DC
  Filled 2018-04-10: qty 1

## 2018-04-10 MED ORDER — ACETAMINOPHEN 160 MG/5ML PO SOLN
650.0000 mg | ORAL | Status: DC | PRN
  Filled 2018-04-10: qty 20.3

## 2018-04-10 MED ORDER — ATORVASTATIN CALCIUM 20 MG PO TABS: 80.0000 mg | ORAL_TABLET | Freq: Every day | ORAL | Status: DC

## 2018-04-10 MED ORDER — PREGABALIN 50 MG PO CAPS
100.0000 mg | ORAL_CAPSULE | Freq: Three times a day (TID) | ORAL | Status: DC
  Administered 2018-04-10: 100 mg via ORAL
  Filled 2018-04-10: qty 2

## 2018-04-10 MED ORDER — STROKE: EARLY STAGES OF RECOVERY BOOK
Freq: Once | Status: AC
  Administered 2018-04-10: 01:00:00

## 2018-04-10 MED ORDER — SENNOSIDES-DOCUSATE SODIUM 8.6-50 MG PO TABS: 1.0000 | ORAL_TABLET | Freq: Every evening | ORAL | Status: DC | PRN

## 2018-04-10 NOTE — Procedures (Signed)
ELECTROENCEPHALOGRAM REPORT   Patient: Gregory Crane [facility]       Room #: 111A-AA EEG No. ID: [id number] Age: 47 y.o.        Sex: male Referring Physician: Sudini Report Date:  04/10/2018        Interpreting Physician: Alexis Goodell  History: [name] is an 47 y.o. male with left facial droop  Medications:  Lipitor, Plavix, Cymbalta, Insulin, Synthroid, Deltasone, Lyrica, Valtrex  Conditions of Recording:  This is a 21 channel routine scalp EEG performed with bipolar and monopolar montages arranged in accordance to the international 10/20 system of electrode placement. One channel was dedicated to EKG recording.  The patient is in the awake, drowsy and asleep states.  Description:  The waking background activity consists of a low voltage, symmetrical, fairly well organized, 10 Hz alpha activity, seen from the parieto-occipital and posterior temporal regions that is poorly sustained.  Low voltage fast activity, poorly organized, is seen anteriorly and is at times superimposed on more posterior regions.  A mixture of theta and alpha rhythms are seen from the central and temporal regions. The patient drowses with slowing to irregular, low voltage theta and beta activity.   The patient goes in to a light sleep with symmetrical sleep spindles, vertex central sharp transients and irregular slow activity.  No epileptiform activity is noted.   Hyperventilation was not performed.  Intermittent photic stimulation was performed but failed to illicit any change in the tracing.   IMPRESSION: Normal electroencephalogram, awake, asleep and with activation procedures. There are no focal lateralizing or epileptiform features.   Alexis Goodell, MD Neurology [phone] 04/10/2018, 5:10 PM

## 2018-04-10 NOTE — Progress Notes (Signed)
Received verbal order to discharge patient home from Dr. Darvin Neighbours.

## 2018-04-10 NOTE — Progress Notes (Signed)
eeg completed ° °

## 2018-04-10 NOTE — Progress Notes (Signed)
Discharged patient home with spouse, verbalized understanding of education. Patient with no complaints.

## 2018-04-10 NOTE — Plan of Care (Signed)
Pt had previous stroke is aware of stroke education. Pt seem to have some issues with left side of mouth. Will continue to monitor.

## 2018-04-10 NOTE — Evaluation (Signed)
Physical Therapy Evaluation Patient Details Name: Gregory Crane MRN: [id number] DOB: [birth year] Today's Date: 04/10/2018   History of Present Illness  Pt presents to hospital on 04/09/18 with complaints of L sided facial drooping. Imaging reveals a late subacute R basal ganglia infarct however no acute abnormalities. 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 Pt performs bed mobility, transfers, and ambulation with independence. Pt demonstrates deficits with L sided facial droop. Pt demonstrates WFL and equal bilateral strength, sensation and coordination of both upper and lower extremities. Pts only notable impairments at this time are L sided facial droop with no effect on speech and L hand dexterity which he states is residual from old stroke and after outpatient OT is no longer effecting his everyday life. Pt amb 200' without AD independently without difficulty. Pt at this time is functioning at his baseline and is independent with all mobility. PT plans to d/c patient in house with no recommended PT follow up. If further PT needs arise please indicate so through a new order.     Follow Up Recommendations No PT follow up    Equipment Recommendations  None recommended by PT    Recommendations for Other Services       Precautions / Restrictions Precautions Precautions: None Restrictions Weight Bearing Restrictions: No      Mobility  Bed Mobility Overal bed mobility: Independent             General bed mobility comments: Pt performs bed mobility independently without difficulties  Transfers Overall transfer level: Independent Equipment used: None             General transfer comment: Pt  transfers sit<>stand independently without difficulties or LOB  Ambulation/Gait Ambulation/Gait assistance: Independent Gait Distance (Feet): 200 Feet Assistive device: None Gait Pattern/deviations: WFL(Within Functional Limits);Step-through pattern     General Gait Details: Pt amb 200' with independence and AD. Pt demonstrates safe step through gait pattern without LOB or difficulty during gait  Stairs            Wheelchair Mobility    Modified Rankin (Stroke Patients Only)       Balance Overall balance assessment: Independent(Stands with narrow BOS and no LOB)                                           Pertinent Vitals/Pain Pain Assessment: No/denies pain    Home Living Family/patient expects to be discharged to:: Private residence Living Arrangements: Spouse/significant other Available Help at Discharge: Family;Available PRN/intermittently Type of Home: House Home Access: Stairs to enter Entrance Stairs-Rails: Can reach both Entrance Stairs-Number of Steps: 3 Home Layout: Two level Home Equipment: Cane - single point      Prior Function Level of Independence: Independent         Comments: Pt states that he was independent without AD stating that he works at a [employer] and is able to [employer]. Pt states that the only residual deficits left from previous strokes is decreased L hand dexterity that does not impede his everyday life. Pt states  that his endurance has improved since previous strokes     Hand Dominance        Extremity/Trunk Assessment   Upper Extremity Assessment Upper Extremity Assessment: Overall WFL for tasks assessed(Grossly at least 4+/5, WFL sensation and coordination)    Lower Extremity Assessment Lower Extremity Assessment: Overall WFL for tasks assessed(Grossly at least 4+/5, WFL sensation and coordination)       Communication   Communication: No difficulties  Cognition  Arousal/Alertness: Awake/alert Behavior During Therapy: WFL for tasks assessed/performed Overall Cognitive Status: Within Functional Limits for tasks assessed                                 General Comments: A & O x4 appropriate throughout      General Comments      Exercises     Assessment/Plan    PT Assessment Patent does not need any further PT services  PT Problem List         PT Treatment Interventions      PT Goals (Current goals can be found in the Care Plan section)  Acute Rehab PT Goals Patient Stated Goal: to go home and get back to work PT Goal Formulation: With patient Time For Goal Achievement: 04/24/18 Potential to Achieve Goals: Good    Frequency     Barriers to discharge        Co-evaluation               AM-PAC PT "6 Clicks" Daily Activity  Outcome Measure Difficulty turning over in bed (including adjusting bedclothes, sheets and blankets)?: None Difficulty moving from lying on back to sitting on the side of the bed? : None Difficulty sitting down on and standing up from a chair with arms (e.g., wheelchair, bedside commode, etc,.)?: None Help needed moving to and from a bed to chair (including a wheelchair)?: None Help needed walking in hospital room?: None Help needed climbing 3-5 steps with a railing? : None 6 Click Score: 24    End of Session Equipment Utilized During Treatment: Gait belt Activity Tolerance: Patient tolerated treatment well Patient left: in chair;with call bell/phone within reach;with chair alarm set Nurse Communication: Mobility status;Other (comment)(D/C in house) PT Visit Diagnosis: Other (comment)(stroke work up assessment)    Time: 9735-3299 PT Time Calculation (min) (ACUTE ONLY): 18 min   Charges:              Yolonda Kida, SPT   Amenda Duclos 04/10/2018, 8:46 AM

## 2018-04-10 NOTE — Progress Notes (Signed)
SLP Cancellation Note  Patient Details Name: Gregory Crane MRN: [id number] DOB: 12-18-1970   Cancelled treatment:        The patient and his wife state that episodes of left facial droop, including the current episode, have had no effect on the patient's speech, language, cognition, or swallowing.  The patient's speech is intelligible and he is able to fully verbalize his history regarding the episodes and effects.  Speech therapy is not indicated, will sign off.  Leroy Sea, MS/CCC- SLP  Lou Miner 04/10/2018, 10:22 AM

## 2018-04-10 NOTE — Evaluation (Signed)
Occupational Therapy Evaluation Patient Details Name: Gregory Crane MRN: [id number] DOB: 04/23/1971 Today's Date: 04/10/2018    History of Present Illness Pt presents to hospital on 04/09/18 with complaints of L sided facial drooping. Imaging reveals a late subacute R basal ganglia infarct however no acute abnormalities. 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 Pt was independent in all ADLs and mobility, living in a 2 story home with his spouse. Pt reports recently becoming easily fatigued or out of breath with minimize exertion, requiring additional time/effort/assist to perform ADL tasks at home. Pt currently requires additional time/effort to perform but no physical assist required. Pt endorses fatigue. Pt/spouse educated in energy conservation conservation strategies including pursed lip breathing, activity pacing, home/routines modifications, work simplification, AE/DME, prioritizing of meaningful occupations, and falls prevention. Handout provided. Pt/spouse verbalized understanding and verbalize plan to implement at home to support return to routines; decline additional OT services (recently had OP OT for L hand). Do not anticipate OT needs upon discharge.       Follow Up Recommendations  No OT follow up    Equipment Recommendations  None recommended by OT    Recommendations for Other Services       Precautions / Restrictions Precautions Precautions: None Restrictions Weight Bearing Restrictions: No      Mobility Bed Mobility Overal bed mobility: Independent                Transfers Overall transfer level: Independent Equipment used: None                  Balance Overall balance assessment: Independent                                         ADL either performed or  assessed with clinical judgement   ADL Overall ADL's : Modified independent                                       General ADL Comments: Pt able to perform most ADL at baseline independence, however becomes slightly fatigued and requires additional time to complete.     Vision Patient Visual Report: No change from baseline       Perception     Praxis      Pertinent Vitals/Pain Pain Assessment: No/denies pain     Hand Dominance Right   Extremity/Trunk Assessment Upper Extremity Assessment Upper Extremity Assessment: Overall WFL for tasks assessed(Grossly at least 4+/5, WFL sensation and coordination)   Lower Extremity Assessment Lower Extremity Assessment: Overall WFL for tasks assessed(Grossly at least 4+/5, WFL sensation and coordination)   Cervical / Trunk Assessment Cervical / Trunk Assessment: Normal   Communication Communication Communication: No difficulties   Cognition Arousal/Alertness: Awake/alert Behavior During Therapy: WFL for tasks assessed/performed Overall Cognitive Status: Within Functional Limits for tasks assessed                                     General Comments       Exercises Other Exercises Other Exercises: Pt endorses  feeling fatigued and has difficulty performing ADL and mobility 2/2 fatigue requiring additional time. Pt/spouse educated in energy conservation strategies with handout provided to support pt's transition back into daily roles and routines safely.  Other Exercises: Pt/spouse educated in tools to assist in [facility] work he has to do with his job; recommended gloves with grippy surface on finger pads to minimize dropping small items.   Shoulder Instructions      Home Living Family/patient expects to be discharged to:: Private residence Living Arrangements: Spouse/significant other Available Help at Discharge: Family;Available PRN/intermittently Type of Home: House Home Access: Stairs to  enter [employer] of Steps: 3 Entrance Stairs-Rails: Can reach both Home Layout: Two level Alternate Level Stairs-Number of Steps: 12 Alternate Level Stairs-Rails: Right           Home Equipment: Cane - single point          Prior Functioning/Environment Level of Independence: Independent        Comments: Pt states that he was independent without AD stating that he works at a [employer] and is able to [employer]. Pt states that the only residual deficits left from previous strokes is decreased L hand dexterity that does not impede his everyday life. Pt states that his endurance has improved since previous strokes        OT Problem List:        OT Treatment/Interventions:      OT Goals(Current goals can be found in the care plan section) Acute Rehab OT Goals Patient Stated Goal: to go home and get back to work OT Goal Formulation: All assessment and education complete, DC therapy  OT Frequency:     Barriers to D/C:            Co-evaluation              AM-PAC PT "6 Clicks" Daily Activity     Outcome Measure Help from another person eating meals?: None Help from another person taking care of personal grooming?: None Help from another person toileting, which includes using toliet, bedpan, or urinal?: None Help from another person bathing (including washing, rinsing, drying)?: None Help from another person to put on and taking off regular upper body clothing?: None Help from another person to put on and taking off regular lower body clothing?: None 6 Click Score: 24   End of Session    Activity Tolerance: Patient tolerated treatment well Patient left: in bed;with call bell/phone within reach;with bed alarm set;with family/visitor present  OT Visit Diagnosis: Other abnormalities of gait and mobility (R26.89)                Time: 5427-0623 OT Time Calculation (min): 17 min Charges:  OT General Charges $OT Visit: 1 Visit OT  Evaluation $OT Eval Low Complexity: 1 Low OT Treatments $Self Care/Home Management : 8-22 mins  Jeni Salles, MPH, MS, OTR/L ascom [phone] 04/10/18, 3:07 PM

## 2018-04-10 NOTE — Discharge Instructions (Signed)
Increase lantus to 20 units daily while on prednisone.

## 2018-04-10 NOTE — Plan of Care (Signed)
  Problem: Education: Goal: Knowledge of General Education information will improve Description Including pain rating scale, medication(s)/side effects and non-pharmacologic comfort measures Outcome: Progressing   Problem: Activity: Goal: Risk for activity intolerance will decrease Outcome: Progressing   Problem: Pain Managment: Goal: General experience of comfort will improve Outcome: Progressing   Problem: Safety: Goal: Ability to remain free from injury will improve Outcome: Progressing   Problem: Skin Integrity: Goal: Risk for impaired skin integrity will decrease Outcome: Progressing   Problem: Education: Goal: Knowledge of disease or condition will improve Outcome: Progressing Goal: Knowledge of secondary prevention will improve Outcome: Progressing Goal: Knowledge of patient specific risk factors addressed and post discharge goals established will improve Outcome: Progressing   Problem: Ischemic Stroke/TIA Tissue Perfusion: Goal: Complications of ischemic stroke/TIA will be minimized Outcome: Progressing

## 2018-04-10 NOTE — Consult Note (Signed)
Referring Physician: Sudini    Chief Complaint: Left facial droop  HPI: Gregory Crane is an 47 y.o. male seen in consultation with admitting physician for left facial droop concerning for acute CVA. 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 Per patient's wife who provides collateral history today, patient was in his usual state of health when he went to bed the night of 04/09/2018. He woke up with no obvious symptoms but per patient, he was not feeling feeling well so did not go to work that day. Wife state that she came back in the evening around 4-5 pm and noted disfiguring left facial asymmetry. He has  Hx of Bell's palsy on the right face and she thought this did not look similar to previous presentation. Patient denies recent new medications (e.g. Influenza Vaccine), recent Tick Bite , recent infection, fever or rash. He also denies other focal neurological deficit and or loss of sensation to the affected area. He state that he has been taking his  Cholesterol medication, Aspirin 81 mg and Plavix consistently as secondary stroke prevention. He had a stroke work up in the ED including CT head and MRI brain which was negative for acute intracranial abnormality other than prior old strokes. Initial NIHSS of 1  Date last known well: Date: 04/10/2018 Time last known well: Time: 18:30 tPA Given: No: NIHSS 1, symptoms too mild and not disabling, the risks of IV tPA would outweigh the potential benefits.  Past Medical History:  Diagnosis Date  . Allergy   . Bell's palsy   . Diabetes mellitus without complication (HCC)    diet controlled  . Hypertension   . Hypothyroidism   . Kidney stones   . Stroke [facility])     Past Surgical History:  Procedure Laterality Date  . LOOP RECORDER INSERTION N/A 01/27/2018   Procedure: LOOP RECORDER  INSERTION;  Surgeon: [name], MD;  Location: [place] [facility];  Service: Cardiovascular;  Laterality: N/A;  . LUMBAR PUNCTURE     as child  . NO PAST SURGERIES    . TEE WITHOUT CARDIOVERSION N/A 01/07/2018   Procedure: TRANSESOPHAGEAL ECHOCARDIOGRAM (TEE);  Surgeon: Minna Merritts, MD;  Location: [facility];  Service: Cardiovascular;  Laterality: N/A;    Family History  Problem Relation Age of Onset  . Breast cancer Mother   . Diabetes Father   . Diabetes Sister   . Arthritis Maternal Grandmother   . Diabetes Maternal Grandmother    Social History:  reports that he has never smoked. He has never used smokeless tobacco. He reports that he does not drink alcohol or use drugs.  Allergies: No Known Allergies  Medications:  I have reviewed the patient's current medications. Scheduled: . atorvastatin  80 mg Oral Daily  . clopidogrel  75 mg Oral Daily  . DULoxetine  20 mg Oral Daily  . heparin  5,000 Units Subcutaneous Q8H  . insulin aspart  0-15 Units Subcutaneous TID WC  . insulin aspart  0-5 Units Subcutaneous QHS  . insulin glargine  12 Units Subcutaneous Daily  . levothyroxine  112 mcg Oral QAC breakfast  . pregabalin  100 mg Oral TID    ROS: History obtained from the patient   General ROS: negative for - chills, fatigue, fever, night sweats, weight gain or weight loss Psychological ROS: negative for -  behavioral disorder, hallucinations, memory difficulties, mood swings or suicidal ideation Ophthalmic ROS: negative for - blurry vision, double vision, eye pain or loss of vision, positive for left eye conjunctivae hemorrhage ENT ROS: negative for - epistaxis, nasal discharge, oral lesions, sore throat, tinnitus or vertigo Allergy and Immunology ROS: negative for - hives or itchy/watery eyes Hematological and Lymphatic ROS: negative for - bleeding problems, bruising or swollen lymph nodes Endocrine ROS: negative for - galactorrhea, hair pattern changes,  polydipsia/polyuria or temperature intolerance Respiratory ROS: negative for - cough, hemoptysis, shortness of breath or wheezing Cardiovascular ROS: negative for - chest pain, dyspnea on exertion, edema or irregular heartbeat Gastrointestinal ROS: negative for - abdominal pain, diarrhea, hematemesis, nausea/vomiting or stool incontinence Genito-Urinary ROS: negative for - dysuria, hematuria, incontinence or urinary frequency/urgency Musculoskeletal ROS: negative for - joint swelling or muscular weakness Neurological ROS: as noted in HPI Dermatological ROS: negative for rash and skin lesion changes  Physical Examination: Blood pressure (!) 155/84, pulse 69, temperature (!) 97.5 F (36.4 C), temperature source Oral, resp. rate 16, height 6' (1.829 m), weight 122.5 kg, SpO2 98 %.  General Exam Patient looks appropriate of age, well built, nourished and appropriately groomed.  Cardiovascular Exam: S1, S2 heart sounds present Carotid exam revealed no bruit Lung exam was clear to auscultation ? Neurological Exam  Mental Status: Alert, Oriented to time, place, person and situation Attention span and concentration seemed appropriate Memory seemed OK. Intact naming, repetition, comprehension.  Followed 2 step commands - no dysarthria Fund of knowledge seemed appropriate for age and health status.  Cranial Nerves: I. Olfactory not examined II: Visual fields were full. Pupils were equal, round and reactive to light and accommodation, left conjunctivae hemorrhage III,IV, VI: ptosis not present, extra-ocular motions intact bilaterally V,VII: Facial asymmetric on the left, facial light touch sensation normal bilaterally, loss of left facial creases and nasolabial fold, no furrow overhead on left VIII: Finger rub was heard symmetric in both ears IX, X: Palate and uvular movements are normal and oral sensations are OK, gag reflex deffered XI: Neck muscle strength and shoulder shrug is  normal XII: midline tongue extension  Motor Exam: Forehead: No motor function in left forehead Incomplete left eye closure Left mouth Asymmetric with maximal effort Tone is normal in all extremities Muscle strength in all extremities is 5/5. No abnormal movements, fasciculations or atrophy seen  Deep Tendon Reflexes: Right Biceps is 2+, Left Biceps is 2+ Right Triceps is 2+, Left Triceps is 2+ Right Brachioradialis is 2+, Left Brachioradialis is 2+ Right Knee Jerk is 2+, Left Knee Jerk is 2+ Right Ankle Jerk is 2+, Left Ankle Jerk is 2+ Right Toes are down going, Left Toes are down going  Sensory Exam: Sensations were intact to light touch in all extremities Vibration and proprioception are also intact  Co-ordination: Finger to nose is normal  Gait: Gait and station are normal when examined in small exam room  Data Reviewed  Laboratory Studies:  Basic Metabolic Panel: Recent Labs  Lab 04/09/18 2031  NA 141  K 4.0  CL 108  CO2 28  GLUCOSE 249*  BUN 20  CREATININE 1.11  CALCIUM 9.2    Liver Function Tests: Recent Labs  Lab 04/09/18 2031  AST 26  ALT 20  ALKPHOS 86  BILITOT 0.8  PROT 6.8  ALBUMIN 3.5   No results for input(s): LIPASE, AMYLASE in the last 168 hours. No results for input(s): AMMONIA in the last 168 hours.  CBC: Recent Labs  Lab 04/09/18 2029-11-01  WBC 5.6  NEUTROABS 3.4  HGB 14.3  HCT 40.8  MCV 86.5  PLT 195    Cardiac Enzymes: Recent Labs  Lab 04/09/18 2031  TROPONINI <0.03    BNP: Invalid input(s): POCBNP  CBG: Recent Labs  Lab 04/09/18 2025/11/01 04/10/18 0043 04/10/18 0742  GLUCAP 258* 129* 144*    Microbiology: No results found for this or any previous visit.  Coagulation Studies: Recent Labs    04/09/18 2029-11-01  LABPROT 11.5  INR 0.85    Urinalysis: No results for input(s): COLORURINE, LABSPEC, PHURINE, GLUCOSEU, HGBUR, BILIRUBINUR, KETONESUR, PROTEINUR, UROBILINOGEN, NITRITE, LEUKOCYTESUR in the last 168  hours.  Invalid input(s): APPERANCEUR  Lipid Panel:    Component Value Date/Time   CHOL 103 04/10/2018 0520   CHOL 164 03/25/2017 1036   TRIG 113 04/10/2018 0520   HDL 29 (L) 04/10/2018 0520   HDL 29 (L) 03/25/2017 1036   CHOLHDL 3.6 04/10/2018 0520   VLDL 23 04/10/2018 0520   LDLCALC 51 04/10/2018 0520   LDLCALC 66 03/25/2017 1036    HgbA1C:  Lab Results  Component Value Date   HGBA1C 7.0 (H) 04/10/2018    Urine Drug Screen:      Component Value Date/Time   LABOPIA NONE DETECTED 01/06/2018 0236   COCAINSCRNUR NONE DETECTED 01/06/2018 0236   LABBENZ NONE DETECTED 01/06/2018 0236   AMPHETMU NONE DETECTED 01/06/2018 0236   THCU NONE DETECTED 01/06/2018 0236   LABBARB NONE DETECTED 01/06/2018 0236    Alcohol Level: No results for input(s): ETH in the last 168 hours.  Other results: EKG: 78 bpm.  Imaging: Mr Brain Wo Contrast  Result Date: 04/10/2018 CLINICAL DATA:  Initial evaluation for acute left-sided facial droop. EXAM: MRI HEAD WITHOUT CONTRAST TECHNIQUE: Multiplanar, multiecho pulse sequences of the brain and surrounding structures were obtained without intravenous contrast. COMPARISON:  Prior CT from 04/09/2018 as well as recent MRI from 01/06/2018. FINDINGS: Brain: There has been interval evolution of recently identified right basal ganglia lacunar infarct, now late subacute in appearance. Mild residual diffusion abnormality persists. Adjacent chronic right basal ganglia lacunar infarct again noted as well. Associated wallerian degeneration extending into the right cerebral peduncle and brainstem. Additional chronic lacunar infarct at the left globus pallidus. No evidence for new or acute infarct. Gray-white matter differentiation maintained. No evidence for acute or interval hemorrhage. No mass lesion, midline shift or mass effect. No hydrocephalus. No extra-axial fluid collection. Normal pituitary. Vascular: Major intravascular flow voids maintained. Skull and upper  cervical spine: Craniocervical junction within normal limits. No focal marrow replacing lesion. No scalp soft tissue abnormality. Sinuses/Orbits: Globes and orbital soft tissues demonstrate no acute finding. Paranasal sinuses are clear. Trace left mastoid effusion noted. Inner ear structures normal. Other: None. IMPRESSION: 1. Interval evolution of recently identified right basal ganglia infarct, now late subacute in appearance. 2. No new intracranial infarct or other abnormality identified. Electronically Signed   By: Jeannine Boga M.D.   On: 04/10/2018 06:23   Ct Head Code Stroke Wo Contrast  Result Date: 04/09/2018 CLINICAL DATA:  Code stroke.  New onset left-sided facial droop. EXAM: CT HEAD WITHOUT CONTRAST TECHNIQUE: Contiguous axial images were obtained from the base of the skull through the vertex without intravenous contrast. COMPARISON:  01/06/2018 FINDINGS: Brain: There is no mass, hemorrhage or extra-axial collection. The size and configuration of the ventricles and extra-axial CSF spaces are normal. Old right basal ganglia lacunar infarct. Brain parenchyma is otherwise normal. Vascular: No abnormal hyperdensity of the  major intracranial arteries or dural venous sinuses. No intracranial atherosclerosis. Skull: The visualized skull base, calvarium and extracranial soft tissues are normal. Sinuses/Orbits: No fluid levels or advanced mucosal thickening of the visualized paranasal sinuses. No mastoid or middle ear effusion. The orbits are normal. ASPECTS ([place] Stroke Program Early CT Score) - Ganglionic level infarction (caudate, lentiform nuclei, internal capsule, insula, M1-M3 cortex): 7 - Supraganglionic infarction (M4-M6 cortex): 3 Total score (0-10 with 10 being normal): 10 Review of the MIP images confirms the above findings IMPRESSION: 1. No hemorrhage or mass effect. 2. Old right basal ganglia lacunar infarct. 3. ASPECTS is 10 These results were called by telephone at the time of  interpretation on 04/09/2018 at 8:49 pm to Dr. Harvest Dark , who verbally acknowledged these results. Electronically Signed   By: Ulyses Jarred M.D.   On: 04/09/2018 20:50    Patient seen and examined.  Clinical course and management discussed.  Necessary edits performed.  I agree with the above.  Assessment and plan of care developed and discussed below.   Assessment: 47 y.o. male with history of multiple strokes (on aspirin and Plavix) last 01/06/18, history of Bells palsy on the right presents with left facial droop initial concerns for CVA.  Presentation more consistent with left Bell's. He has history of Diabetes Mellitus a predisposing factor. Recent hgb A1c 7.0 increased from 6.4 in the last 3 months. MRI brain personally reviewed which showed no acute infarct with prior right basal ganglia infarct noted. Previous negative hypercoagulable work up with factor V Leiden, protein C, Protein S, Antithrombin assay, Antiphospholipid syndrome, Prothrombin gene mutation, slightly elevated  Homocysteine.  Plan: 1. EEG                                                2. Lyme, ESR, CRP, TSH 3. Recommend course of combination steroids and antiviral agent    Alexis Goodell, MD Neurology [phone]   04/10/2018, 10:26 AM

## 2018-04-15 ENCOUNTER — Encounter: Payer: Self-pay | Admitting: Internal Medicine

## 2018-04-16 ENCOUNTER — Other Ambulatory Visit: Payer: Self-pay | Admitting: Internal Medicine

## 2018-04-16 NOTE — Telephone Encounter (Signed)
Patient would like referral to Dr. Trena Platt office. Advised that if he did not want to come to two visits we could place referral and he could f/u with them. Patient was agreeable to that.

## 2018-04-16 NOTE — Telephone Encounter (Signed)
I do not mind seeing pt.  I would also recommend neurology referral.  If does not want to have to go to both appts, would at least recommend f/u with neurology.

## 2018-04-17 NOTE — Telephone Encounter (Signed)
Message sent to our referral coordinator regarding scheduling an appt with neurology.  If problems or needs to come in, let us know.

## 2018-04-20 ENCOUNTER — Other Ambulatory Visit: Payer: Self-pay | Admitting: Internal Medicine

## 2018-04-23 NOTE — Discharge Summary (Signed)
[facility] NAME: Gregory Crane    MR#:  [id number]  DATE OF BIRTH:  [birth year]  DATE OF ADMISSION:  04/09/2018 ADMITTING PHYSICIAN: Amelia Jo, MD  DATE OF DISCHARGE: 04/10/2018  7:00 PM  PRIMARY CARE PHYSICIAN: [name], MD   ADMISSION DIAGNOSIS:  Cerebrovascular accident (CVA), unspecified mechanism ([place]) [I63.9]  DISCHARGE DIAGNOSIS:  Active Problems:   Facial droop   SECONDARY DIAGNOSIS:   Past Medical History:  Diagnosis Date  . Allergy   . Bell's palsy   . Diabetes mellitus without complication (HCC)    diet controlled  . Hypertension   . Hypothyroidism   . Kidney stones   . Stroke [facility])      ADMITTING HISTORY  HISTORY OF PRESENT ILLNESS: Gregory Crane  is a 47 y.o. male with a known history of diabetes type 2, hypertension, hyperlipidemia, recurrent strokes and Bell's palsy. Patient presented to emergency room for left facial droop noted by his wife, earlier today, around 4:30 PM.  No other neurologic symptoms.  Patient has had 3 prior strokes in the past year.  The main symptom of the strokes was always left facial droop, which will resolve completely within few days.  Per patient, he does not have a facial droop at baseline.  Patient states he is compliant with his medications. That is done emergency room, including troponin level, CBC and CMP are grossly unremarkable except for elevated glucose level at 249. CT scan of the head is negative for any acute intracranial abnormalities; prior stroke is noted. Patient is admitted for further evaluation and treatment.  HOSPITAL COURSE:   *Left facial droop.  Initially stroke was considered.  MRI of the brain was normal.  But more appropriate diagnosis was found to be Bell's palsy after discussing with neurology.  Patient started on prednisone, valacyclovir and discharged home to follow-up with primary care physician and neurology. Advised to return if any  worsening.  Other comorbidities remained stable.  Discharged home in stable condition.  CONSULTS OBTAINED:  Treatment Team:  Alexis Goodell, MD  DRUG ALLERGIES:  No Known Allergies  DISCHARGE MEDICATIONS:   Allergies as of 04/10/2018   No Known Allergies     Medication List    TAKE these medications   aspirin 81 MG EC tablet TAKE 1 TABLET(81 MG) BY MOUTH DAILY   atorvastatin 80 MG tablet Commonly known as:  LIPITOR Take 1 tablet (80 mg total) by mouth daily.   clopidogrel 75 MG tablet Commonly known as:  PLAVIX TAKE 1 TABLET(75 MG) BY MOUTH DAILY   DULoxetine 20 MG capsule Commonly known as:  CYMBALTA Take 20 mg by mouth daily.   glucose blood test strip Check blood sugars twice a day (Dx. 250.02)   HUMALOG 100 UNIT/ML injection Generic drug:  insulin lispro INJECT 4 TO 6 UNITS UNDER THE SKIN THREE TIMES DAILY   Insulin Syringe-Needle U-100 27G X 1/2" 1 ML Misc Use as directed with insulin. One needle per use.   INSULIN SYRINGE .5CC/29G 29G X 1/2" 0.5 ML Misc USE AS DIRECTED   LANTUS 100 UNIT/ML injection Generic drug:  insulin glargine INJECT 0.15 ML(15 UNITS) INTO THE SKIN EVERY NIGHT AT BEDTIME   levothyroxine 112 MCG tablet Commonly known as:  SYNTHROID, LEVOTHROID Take 1 tablet (112 mcg total) by mouth daily before breakfast.   losartan 50 MG tablet Commonly known as:  COZAAR Take 1 tablet (50 mg total) by mouth daily. What changed:  how much  to take   metFORMIN 500 MG tablet Commonly known as:  GLUCOPHAGE TAKE 1 TABLET BY MOUTH DAILY WITH BREAKFAST   ONETOUCH DELICA LANCETS FINE Misc 1 applicator by Other route 4 (four) times daily.   predniSONE 20 MG tablet Commonly known as:  DELTASONE Take 3 tablets (60 mg total) by mouth daily with breakfast.   pregabalin 100 MG capsule Commonly known as:  LYRICA Take 1 capsule (100 mg total) by mouth 3 (three) times daily.   valACYclovir 1000 MG tablet Commonly known as:  VALTREX Take 1 tablet  (1,000 mg total) by mouth 3 (three) times daily.       Today   VITAL SIGNS:  Blood pressure (!) 173/90, pulse 66, temperature (!) 97.4 F (36.3 C), temperature source Oral, resp. rate 20, height 6' (1.829 m), weight 122.5 kg, SpO2 99 %.  I/O:  No intake or output data in the 24 hours ending 04/23/18 1359  PHYSICAL EXAMINATION:  Physical Exam  GENERAL:  47 y.o.-year-old patient lying in the bed with no acute distress.  LUNGS: Normal breath sounds bilaterally, no wheezing, rales,rhonchi or crepitation. No use of accessory muscles of respiration.  CARDIOVASCULAR: S1, S2 normal. No murmurs, rubs, or gallops.  ABDOMEN: Soft, non-tender, non-distended. Bowel sounds present. No organomegaly or mass.  NEUROLOGIC: Moves all 4 extremities. PSYCHIATRIC: The patient is alert and oriented x 3.  SKIN: No obvious rash, lesion, or ulcer.   DATA REVIEW:   CBC No results for input(s): WBC, HGB, HCT, PLT in the last 168 hours.  Chemistries  No results for input(s): NA, K, CL, CO2, GLUCOSE, BUN, CREATININE, CALCIUM, MG, AST, ALT, ALKPHOS, BILITOT in the last 168 hours.  Invalid input(s): GFRCGP  Cardiac Enzymes No results for input(s): TROPONINI in the last 168 hours.  Microbiology Results  No results found for this or any previous visit.  RADIOLOGY:  No results found.  Follow up with PCP in 1 week.  Management plans discussed with the patient, family and they are in agreement.  CODE STATUS:  Code Status History    Date Active Date Inactive Code Status Order ID Comments User Context   04/10/2018 0034 04/10/2018 2208 Full Code [id number]  Amelia Jo, MD Inpatient   01/06/2018 0423 01/07/2018 1635 Full Code [id number]  Arta Silence, MD Inpatient   09/12/2017 2347 09/14/2017 1444 Full Code [id number]  Amelia Jo, MD ED   10/20/2016 2044 10/21/2016 1529 Full Code [id number]  Gladstone Lighter, MD Inpatient      TOTAL TIME TAKING CARE OF THIS PATIENT ON DAY OF DISCHARGE: more than  30 minutes.   [name] M.D on 04/23/2018 at 1:59 PM  Between 7am to 6pm - Pager - [phone]  After 6pm go to [email or web address] - password [email or web address] [facility]  Office  [phone]  CC: Primary care physician; [name], MD  Note: This dictation was prepared with Dragon dictation along with smaller phrase technology. Any transcriptional errors that result from this process are unintentional.

## 2018-04-28 ENCOUNTER — Other Ambulatory Visit: Payer: Self-pay | Admitting: Internal Medicine

## 2018-05-06 ENCOUNTER — Ambulatory Visit (INDEPENDENT_AMBULATORY_CARE_PROVIDER_SITE_OTHER): Payer: Managed Care, Other (non HMO) | Admitting: *Deleted

## 2018-05-06 ENCOUNTER — Encounter: Payer: Self-pay | Admitting: Internal Medicine

## 2018-05-06 DIAGNOSIS — I639 Cerebral infarction, unspecified: Secondary | ICD-10-CM

## 2018-05-06 NOTE — Progress Notes (Signed)
[facility] Summary Report / Loop Recorder 

## 2018-05-13 LAB — CUP PACEART REMOTE DEVICE CHECK
Date Time Interrogation Session: 20190809120941
Implantable Pulse Generator Implant Date: 20190604

## 2018-05-13 NOTE — Telephone Encounter (Signed)
Called patient to schedule an appt.

## 2018-05-14 NOTE — Telephone Encounter (Signed)
Pt returning call to [place]. He states to leave him a voicemail with appt if possible as he will be at work the rest of today.

## 2018-05-18 NOTE — Telephone Encounter (Signed)
° ° °  Pt returning [place] call he said he is working till 6 and can leave him a message

## 2018-05-22 LAB — CUP PACEART REMOTE DEVICE CHECK
Date Time Interrogation Session: 20190911123747
Implantable Pulse Generator Implant Date: 20190604

## 2018-05-22 NOTE — Telephone Encounter (Signed)
Pt scheduled  

## 2018-05-29 ENCOUNTER — Other Ambulatory Visit: Payer: Self-pay | Admitting: Internal Medicine

## 2018-06-05 ENCOUNTER — Other Ambulatory Visit: Payer: Self-pay

## 2018-06-05 ENCOUNTER — Other Ambulatory Visit: Payer: Self-pay | Admitting: Internal Medicine

## 2018-06-08 ENCOUNTER — Ambulatory Visit (INDEPENDENT_AMBULATORY_CARE_PROVIDER_SITE_OTHER): Payer: Managed Care, Other (non HMO) | Admitting: *Deleted

## 2018-06-08 DIAGNOSIS — I639 Cerebral infarction, unspecified: Secondary | ICD-10-CM | POA: Diagnosis not present

## 2018-06-09 ENCOUNTER — Ambulatory Visit: Payer: Managed Care, Other (non HMO) | Admitting: Internal Medicine

## 2018-06-09 ENCOUNTER — Encounter: Payer: Self-pay | Admitting: Internal Medicine

## 2018-06-09 VITALS — BP 148/88 | HR 74 | Temp 97.6°F | Resp 18 | Wt 287.6 lb

## 2018-06-09 DIAGNOSIS — E538 Deficiency of other specified B group vitamins: Secondary | ICD-10-CM | POA: Diagnosis not present

## 2018-06-09 DIAGNOSIS — R400 Somnolence: Secondary | ICD-10-CM | POA: Diagnosis not present

## 2018-06-09 DIAGNOSIS — E11319 Type 2 diabetes mellitus with unspecified diabetic retinopathy without macular edema: Secondary | ICD-10-CM

## 2018-06-09 DIAGNOSIS — Z8673 Personal history of transient ischemic attack (TIA), and cerebral infarction without residual deficits: Secondary | ICD-10-CM

## 2018-06-09 DIAGNOSIS — I10 Essential (primary) hypertension: Secondary | ICD-10-CM | POA: Diagnosis not present

## 2018-06-09 DIAGNOSIS — E039 Hypothyroidism, unspecified: Secondary | ICD-10-CM

## 2018-06-09 DIAGNOSIS — E785 Hyperlipidemia, unspecified: Secondary | ICD-10-CM

## 2018-06-09 MED ORDER — CYANOCOBALAMIN 1000 MCG/ML IJ SOLN
1000.0000 ug | Freq: Once | INTRAMUSCULAR | Status: AC
  Administered 2018-06-09: 1000 ug via INTRAMUSCULAR

## 2018-06-09 NOTE — Progress Notes (Signed)
Patient ID: Gregory Crane, male   DOB: Jul 13, 1971, 47 y.o.   MRN: [id number]   Subjective:    Patient ID: Gregory Crane, male    DOB: 20-Apr-1971, 47 y.o.   MRN: [id number]  HPI  Patient here for a scheduled follow up.  Has a history of CVA.  Known diabetes.  Has been poorly controlled.  Is working.  Tired at the end of the day.  No chest pain.  No sob.  No acid reflux.  No abdominal pain.  Bowels moving.  Able to get up and move around easier.  Still with concerns regarding.sleep apnea.  Has not scheduled with appt with pulmonary.  Some increased stress.  Discussed with him today.  Overall handling things relatively well.     Past Medical History:  Diagnosis Date  . Allergy   . Bell's palsy   . Diabetes mellitus without complication (HCC)    diet controlled  . Hypertension   . Hypothyroidism   . Kidney stones   . Stroke [facility])    Past Surgical History:  Procedure Laterality Date  . LOOP RECORDER INSERTION N/A 01/27/2018   Procedure: LOOP RECORDER INSERTION;  Surgeon: [name], MD;  Location: [facility];  Service: Cardiovascular;  Laterality: N/A;  . LUMBAR PUNCTURE     as child  . NO PAST SURGERIES    . TEE WITHOUT CARDIOVERSION N/A 01/07/2018   Procedure: TRANSESOPHAGEAL ECHOCARDIOGRAM (TEE);  Surgeon: Minna Merritts, MD;  Location: [facility] OR[facility];  Service: Cardiovascular;  Laterality: N/A;   Family History  Problem Relation Age of Onset  . Breast cancer Mother   . Diabetes Father   . Diabetes Sister   . Arthritis Maternal Grandmother   . Diabetes Maternal Grandmother    Social History   Socioeconomic History  . Marital status: Married    Spouse name: [name]  . Number of children: 2  . Years of education: Not on file  . Highest education level: Not on file  Occupational History  . Not on file  Social Needs  . Financial resource strain: Not hard at all  . Food insecurity:    Worry: Never true    Inability: Never true  . Transportation needs:   Medical: No    Non-medical: No  Tobacco Use  . Smoking status: Never Smoker  . Smokeless tobacco: Never Used  Substance and Sexual Activity  . Alcohol use: No    Alcohol/week: 0.0 standard drinks    Comment: very rarely (1-2/year)  . Drug use: No  . Sexual activity: Yes  Lifestyle  . Physical activity:    Days per week: 0 days    Minutes per session: 0 min  . Stress: To some extent  Relationships  . Social connections:    Talks on phone: More than three times a week    Gets together: More than three times a week    Attends religious service: More than 4 times per year    Active member of club or organization: No    Attends meetings of clubs or organizations: Never    Relationship status: Married  Other Topics Concern  . Not on file  Social History Narrative   Lives at home with wife, independent at baseline.    Outpatient Encounter Medications as of 06/09/2018  Medication Sig  . aspirin 81 MG EC tablet TAKE 1 TABLET(81 MG) BY MOUTH DAILY  . atorvastatin (LIPITOR) 80 MG tablet Take 1 tablet (80 mg total) by  mouth daily.  . clopidogrel (PLAVIX) 75 MG tablet TAKE 1 TABLET(75 MG) BY MOUTH DAILY  . DULoxetine (CYMBALTA) 20 MG capsule Take 20 mg by mouth daily.   Marland Kitchen glucose blood (FREESTYLE LITE) test strip Check blood sugars twice a day (Dx. 250.02)  . HUMALOG 100 UNIT/ML injection INJECT 4 TO 6 UNITS UNDER THE SKIN THREE TIMES DAILY  . INSULIN SYRINGE .5CC/29G 29G X 1/2" 0.5 ML MISC USE AS DIRECTED  . Insulin Syringe-Needle U-100 27G X 1/2" 1 ML MISC Use as directed with insulin. One needle per use.  Marland Kitchen LANTUS 100 UNIT/ML injection INJECT 0.15 ML(15 UNITS) INTO THE SKIN EVERY NIGHT AT BEDTIME  . levothyroxine (SYNTHROID) 112 MCG tablet Take 1 tablet (112 mcg total) by mouth daily before breakfast.  . losartan (COZAAR) 25 MG tablet TAKE 1 TABLET BY MOUTH  DAILY  . metFORMIN (GLUCOPHAGE) 500 MG tablet TAKE 1 TABLET BY MOUTH DAILY WITH BREAKFAST  . ONETOUCH DELICA LANCETS FINE MISC  1 applicator by Other route 4 (four) times daily.  . predniSONE (DELTASONE) 20 MG tablet Take 3 tablets (60 mg total) by mouth daily with breakfast.  . pregabalin (LYRICA) 100 MG capsule Take 1 capsule (100 mg total) by mouth 3 (three) times daily.  . valACYclovir (VALTREX) 1000 MG tablet Take 1 tablet (1,000 mg total) by mouth 3 (three) times daily.  . [DISCONTINUED] losartan (COZAAR) 50 MG tablet TAKE 1 TABLET(50 MG) BY MOUTH DAILY  . [EXPIRED] cyanocobalamin ((VITAMIN B-12)) injection 1,000 mcg    No facility-administered encounter medications on file as of 06/09/2018.     Review of Systems  Constitutional: Positive for fatigue. Negative for appetite change and unexpected weight change.  HENT: Negative for congestion and sinus pressure.   Respiratory: Negative for cough, chest tightness and shortness of breath.   Cardiovascular: Negative for chest pain, palpitations and leg swelling.  Gastrointestinal: Negative for abdominal pain, diarrhea, nausea and vomiting.  Genitourinary: Negative for difficulty urinating and dysuria.  Musculoskeletal: Negative for joint swelling and myalgias.  Skin: Negative for color change and rash.  Neurological: Negative for dizziness, light-headedness and headaches.  Psychiatric/Behavioral: Negative for agitation and dysphoric mood.       Objective:    Physical Exam  Constitutional: He appears well-developed and well-nourished. No distress.  HENT:  Nose: Nose normal.  Mouth/Throat: Oropharynx is clear and moist.  Neck: Neck supple. No thyromegaly present.  Cardiovascular: Normal rate and regular rhythm.  Pulmonary/Chest: Effort normal and breath sounds normal. No respiratory distress.  Abdominal: Soft. Bowel sounds are normal. There is no tenderness.  Musculoskeletal: He exhibits no edema or tenderness.  Lymphadenopathy:    He has no cervical adenopathy.  Skin: No rash noted. No erythema.  Psychiatric: He has a normal mood and affect. His behavior  is normal.    BP (!) 148/88 (BP Location: Left Arm, Patient Position: Sitting, Cuff Size: Normal)   Pulse 74   Temp 97.6 F (36.4 C) (Oral)   Resp 18   Wt 287 lb 9.6 oz (130.5 kg)   SpO2 96%   BMI 39.01 kg/m  Wt Readings from Last 3 Encounters:  06/09/18 287 lb 9.6 oz (130.5 kg)  04/09/18 270 lb (122.5 kg)  03/27/18 278 lb (126.1 kg)     Lab Results  Component Value Date   WBC 5.6 04/09/2018   HGB 14.3 04/09/2018   HCT 40.8 04/09/2018   PLT 195 04/09/2018   GLUCOSE 249 (H) 04/09/2018   CHOL 103 04/10/2018  TRIG 113 04/10/2018   HDL 29 (L) 04/10/2018   LDLCALC 51 04/10/2018   ALT 20 04/09/2018   AST 26 04/09/2018   NA 141 04/09/2018   K 4.0 04/09/2018   CL 108 04/09/2018   CREATININE 1.11 04/09/2018   BUN 20 04/09/2018   CO2 28 04/09/2018   TSH 7.970 (H) 03/25/2017   INR 0.85 04/09/2018   HGBA1C 7.0 (H) 04/10/2018    Mr Brain Wo Contrast  Result Date: 04/10/2018 CLINICAL DATA:  Initial evaluation for acute left-sided facial droop. EXAM: MRI HEAD WITHOUT CONTRAST TECHNIQUE: Multiplanar, multiecho pulse sequences of the brain and surrounding structures were obtained without intravenous contrast. COMPARISON:  Prior CT from 04/09/2018 as well as recent MRI from 01/06/2018. FINDINGS: Brain: There has been interval evolution of recently identified right basal ganglia lacunar infarct, now late subacute in appearance. Mild residual diffusion abnormality persists. Adjacent chronic right basal ganglia lacunar infarct again noted as well. Associated wallerian degeneration extending into the right cerebral peduncle and brainstem. Additional chronic lacunar infarct at the left globus pallidus. No evidence for new or acute infarct. Gray-white matter differentiation maintained. No evidence for acute or interval hemorrhage. No mass lesion, midline shift or mass effect. No hydrocephalus. No extra-axial fluid collection. Normal pituitary. Vascular: Major intravascular flow voids  maintained. Skull and upper cervical spine: Craniocervical junction within normal limits. No focal marrow replacing lesion. No scalp soft tissue abnormality. Sinuses/Orbits: Globes and orbital soft tissues demonstrate no acute finding. Paranasal sinuses are clear. Trace left mastoid effusion noted. Inner ear structures normal. Other: None. IMPRESSION: 1. Interval evolution of recently identified right basal ganglia infarct, now late subacute in appearance. 2. No new intracranial infarct or other abnormality identified. Electronically Signed   By: Jeannine Boga M.D.   On: 04/10/2018 06:23   Ct Head Code Stroke Wo Contrast  Result Date: 04/09/2018 CLINICAL DATA:  Code stroke.  New onset left-sided facial droop. EXAM: CT HEAD WITHOUT CONTRAST TECHNIQUE: Contiguous axial images were obtained from the base of the skull through the vertex without intravenous contrast. COMPARISON:  01/06/2018 FINDINGS: Brain: There is no mass, hemorrhage or extra-axial collection. The size and configuration of the ventricles and extra-axial CSF spaces are normal. Old right basal ganglia lacunar infarct. Brain parenchyma is otherwise normal. Vascular: No abnormal hyperdensity of the major intracranial arteries or dural venous sinuses. No intracranial atherosclerosis. Skull: The visualized skull base, calvarium and extracranial soft tissues are normal. Sinuses/Orbits: No fluid levels or advanced mucosal thickening of the visualized paranasal sinuses. No mastoid or middle ear effusion. The orbits are normal. ASPECTS ([place] Stroke Program Early CT Score) - Ganglionic level infarction (caudate, lentiform nuclei, internal capsule, insula, M1-M3 cortex): 7 - Supraganglionic infarction (M4-M6 cortex): 3 Total score (0-10 with 10 being normal): 10 Review of the MIP images confirms the above findings IMPRESSION: 1. No hemorrhage or mass effect. 2. Old right basal ganglia lacunar infarct. 3. ASPECTS is 10 These results were called by  telephone at the time of interpretation on 04/09/2018 at 8:49 pm to Dr. Harvest Dark , who verbally acknowledged these results. Electronically Signed   By: Ulyses Jarred M.D.   On: 04/09/2018 20:50       Assessment & Plan:   Problem List Items Addressed This Visit    B12 deficiency - Primary    Check B12.        Relevant Medications   cyanocobalamin ((VITAMIN B-12)) injection 1,000 mcg (Completed)   Other Relevant Orders   Vitamin B12  Daytime somnolence    See last note.  Wakes up fatigued.  Increased daytime somnolence.  Concern over sleep apnea.  Has not made appt with pulmonary.  Agrees to be scheduled.        Diabetes ([place])    Brought in no recorded sugar readings.  AM sugar 140s.  PM sugars lower.  Discussed diet and exercise.  Follow met b and a1c.        Relevant Orders   Hemoglobin X0X   Basic metabolic panel   Microalbumin / creatinine urine ratio   Essential hypertension    Blood pressure as outlined.  Have him spot check his pressure.  Adjust medication if needed.  Follow met b.        Relevant Orders   TSH   History of CVA (cerebrovascular accident)    Has had extensive w/up.  On aspirin and plavix.  No recurring symptoms recently.  Follow.        Hyperlipidemia    On lipitor.  Low cholesterol diet and exercise.  Follow lipid panel and liver function tests.        Relevant Orders   Hepatic function panel   Lipid panel   Hypothyroidism    On thyroid replacement.  Follow tsh.            [name], MD

## 2018-06-09 NOTE — Patient Instructions (Signed)
Go by [employer] one week before your follow up appointment.

## 2018-06-09 NOTE — Progress Notes (Signed)
[facility] Summary Report / Loop Recorder 

## 2018-06-13 ENCOUNTER — Encounter: Payer: Self-pay | Admitting: Internal Medicine

## 2018-06-13 NOTE — Assessment & Plan Note (Signed)
On thyroid replacement.  Follow tsh.  

## 2018-06-13 NOTE — Assessment & Plan Note (Signed)
Blood pressure as outlined.  Have him spot check his pressure.  Adjust medication if needed.  Follow met b.   

## 2018-06-13 NOTE — Assessment & Plan Note (Signed)
Check B12 

## 2018-06-13 NOTE — Assessment & Plan Note (Signed)
Has had extensive w/up.  On aspirin and plavix.  No recurring symptoms recently.  Follow.

## 2018-06-13 NOTE — Assessment & Plan Note (Signed)
Brought in no recorded sugar readings.  AM sugar 140s.  PM sugars lower.  Discussed diet and exercise.  Follow met b and a1c.

## 2018-06-13 NOTE — Assessment & Plan Note (Signed)
On lipitor.  Low cholesterol diet and exercise.  Follow lipid panel and liver function tests.   

## 2018-06-13 NOTE — Assessment & Plan Note (Signed)
See last note.  Wakes up fatigued.  Increased daytime somnolence.  Concern over sleep apnea.  Has not made appt with pulmonary.  Agrees to be scheduled.

## 2018-06-20 ENCOUNTER — Other Ambulatory Visit: Payer: Self-pay | Admitting: Internal Medicine

## 2018-06-22 LAB — CUP PACEART REMOTE DEVICE CHECK
Date Time Interrogation Session: 20191014133920
Implantable Pulse Generator Implant Date: 20190604

## 2018-06-29 ENCOUNTER — Other Ambulatory Visit: Payer: Self-pay | Admitting: Internal Medicine

## 2018-07-01 ENCOUNTER — Other Ambulatory Visit: Payer: Self-pay

## 2018-07-13 ENCOUNTER — Ambulatory Visit (INDEPENDENT_AMBULATORY_CARE_PROVIDER_SITE_OTHER): Payer: Managed Care, Other (non HMO)

## 2018-07-13 DIAGNOSIS — I639 Cerebral infarction, unspecified: Secondary | ICD-10-CM

## 2018-07-13 NOTE — Progress Notes (Signed)
[facility] Summary Report / Loop Recorder 

## 2018-07-21 ENCOUNTER — Encounter: Payer: Self-pay | Admitting: Internal Medicine

## 2018-07-21 NOTE — Telephone Encounter (Signed)
Handicap form completed.  Placed in box.  He will have to sign and put his drivers license number on form.

## 2018-07-21 NOTE — Telephone Encounter (Signed)
OK to renew his parking placard?

## 2018-07-21 NOTE — Telephone Encounter (Signed)
Patient aware and form placed up front

## 2018-07-26 ENCOUNTER — Other Ambulatory Visit: Payer: Self-pay | Admitting: Internal Medicine

## 2018-07-27 ENCOUNTER — Emergency Department
Admission: EM | Admit: 2018-07-27 | Discharge: 2018-07-28 | Disposition: A | Discharge status: Home / Self Care | Payer: Managed Care, Other (non HMO) | Attending: Emergency Medicine | Admitting: Emergency Medicine

## 2018-07-27 ENCOUNTER — Emergency Department: Payer: Managed Care, Other (non HMO)

## 2018-07-27 ENCOUNTER — Other Ambulatory Visit: Payer: Self-pay

## 2018-07-27 DIAGNOSIS — Z79899 Other long term (current) drug therapy: Secondary | ICD-10-CM | POA: Diagnosis not present

## 2018-07-27 DIAGNOSIS — E119 Type 2 diabetes mellitus without complications: Secondary | ICD-10-CM | POA: Insufficient documentation

## 2018-07-27 DIAGNOSIS — Z7982 Long term (current) use of aspirin: Secondary | ICD-10-CM | POA: Insufficient documentation

## 2018-07-27 DIAGNOSIS — Z794 Long term (current) use of insulin: Secondary | ICD-10-CM | POA: Diagnosis not present

## 2018-07-27 DIAGNOSIS — I10 Essential (primary) hypertension: Secondary | ICD-10-CM | POA: Diagnosis not present

## 2018-07-27 DIAGNOSIS — L03113 Cellulitis of right upper limb: Secondary | ICD-10-CM | POA: Insufficient documentation

## 2018-07-27 DIAGNOSIS — Z8673 Personal history of transient ischemic attack (TIA), and cerebral infarction without residual deficits: Secondary | ICD-10-CM | POA: Diagnosis not present

## 2018-07-27 DIAGNOSIS — E039 Hypothyroidism, unspecified: Secondary | ICD-10-CM | POA: Diagnosis not present

## 2018-07-27 DIAGNOSIS — R609 Edema, unspecified: Secondary | ICD-10-CM

## 2018-07-27 DIAGNOSIS — M25521 Pain in right elbow: Secondary | ICD-10-CM | POA: Diagnosis present

## 2018-07-27 LAB — COMPREHENSIVE METABOLIC PANEL
ALT: 21 U/L (ref 0–44)
AST: 23 U/L (ref 15–41)
Albumin: 3.4 g/dL — ABNORMAL LOW (ref 3.5–5.0)
Alkaline Phosphatase: 91 U/L (ref 38–126)
Anion gap: 5 (ref 5–15)
BUN: 28 mg/dL — ABNORMAL HIGH (ref 6–20)
CO2: 30 mmol/L (ref 22–32)
Calcium: 8.6 mg/dL — ABNORMAL LOW (ref 8.9–10.3)
Chloride: 108 mmol/L (ref 98–111)
Creatinine, Ser: 1.16 mg/dL (ref 0.61–1.24)
GFR calc Af Amer: >60 mL/min (ref >60)
GFR calc non Af Amer: >60 mL/min (ref >60)
Glucose, Bld: 172 mg/dL — ABNORMAL HIGH (ref 70–99)
Potassium: 4.8 mmol/L (ref 3.5–5.1)
Sodium: 143 mmol/L (ref 135–145)
Total Bilirubin: 0.6 mg/dL (ref 0.3–1.2)
Total Protein: 6.5 g/dL (ref 6.5–8.1)

## 2018-07-27 LAB — CBC WITH DIFFERENTIAL/PLATELET
Abs Immature Granulocytes: 0.03 10*3/uL (ref 0.00–0.07)
Basophils Absolute: 0 10*3/uL (ref 0.0–0.1)
Basophils Relative: 1 %
Eosinophils Absolute: 0.1 10*3/uL (ref 0.0–0.5)
Eosinophils Relative: 2 %
HCT: 42.1 % (ref 39.0–52.0)
Hemoglobin: 14.3 g/dL (ref 13.0–17.0)
Immature Granulocytes: 0 %
Lymphocytes Relative: 28 %
Lymphs Abs: 2.1 10*3/uL (ref 0.7–4.0)
MCH: 30.1 pg (ref 26.0–34.0)
MCHC: 34 g/dL (ref 30.0–36.0)
MCV: 88.6 fL (ref 80.0–100.0)
Monocytes Absolute: 0.6 10*3/uL (ref 0.1–1.0)
Monocytes Relative: 7 %
Neutro Abs: 4.9 10*3/uL (ref 1.7–7.7)
Neutrophils Relative %: 62 %
Platelets: 218 10*3/uL (ref 150–400)
RBC: 4.75 MIL/uL (ref 4.22–5.81)
RDW: 12 % (ref 11.5–15.5)
WBC: 7.8 10*3/uL (ref 4.0–10.5)
nRBC: 0 % (ref 0.0–0.2)

## 2018-07-27 NOTE — ED Triage Notes (Addendum)
Pt arrives to ED via POV from home with c/o right elbow and right arm pain x"over a week ago". Pt reports it started with painful motion to the right elbow, but has since started swelling. Pt reports being sent here by his PCP to rule out a possible infection. CMS intact in right hand. Pt reports taking r/x'd antibiotic for current s/x's, but unsure of exact name. No c/o N/V/D or fever, no reported CP or SHOB.

## 2018-07-27 NOTE — ED Provider Notes (Signed)
[facility] Emergency Department Provider Note ____________________________________________  Time seen: Approximately 9:42 PM  I have reviewed the triage vital signs and the nursing notes.   HISTORY  Chief Complaint Elbow Pain    HPI Gregory Crane is a 47 y.o. male who presents to the emergency department for evaluation and treatment of right upper extremity pain for the past week.  Pain started initially upon awakening when he fully extend in the right elbow.  He thought he had just slept on it wrong.  Since that time he has had swelling.  His primary care provider evaluated him and thought maybe it was a bursitis.  He treated him with antibiotics and prednisone.  No improvement after a few days.  Primary care advised him to come to the emergency department for further evaluation.  He denies any recent IV sticks or lab draws from the right arm.  He denies any other symptoms including chest pain or shortness of breath.  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  He denies any recent injury and does not recall a specific onset of pain.  Patient has a significant past medical history of diabetes, CVA, and hypertension.  Past Medical History:  Diagnosis Date  . Allergy   . Bell's palsy   . Diabetes mellitus without complication (HCC)    diet controlled  . Hypertension   . Hypothyroidism   . Kidney stones   . Stroke [facility])     Patient Active Problem List   Diagnosis Date Noted  . Facial droop 04/09/2018  . Daytime somnolence 03/30/2018  . Carotid stenosis 02/03/2018  . Acute CVA (cerebrovascular accident) ([place]) 01/06/2018  . Intracranial vascular stenosis 09/12/2017  . Cough 01/20/2017  . Bell's palsy 11/10/2016  . History of CVA (cerebrovascular accident) 11/10/2016  . 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 History of nephrolithiasis 10/27/2016  . TIA (transient ischemic  attack) 10/20/2016  . Near syncope 06/23/2016  . 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 Neuropathy 08/06/2015  . Health care maintenance 08/06/2015  . Essential hypertension 08/06/2015  . Heme positive stool 10/10/2013  . Hypothyroidism 10/10/2013  . Microalbuminuria 10/10/2013  . Hyperlipidemia 10/10/2013  . B12 deficiency 10/10/2013  . Diabetes ([place]) 07/04/2013  . Environmental allergies 07/04/2013    Past Surgical History:  Procedure Laterality Date  . LOOP RECORDER INSERTION N/A 01/27/2018   Procedure: LOOP RECORDER INSERTION;  Surgeon: [name], MD;  Location: [facility];  Service: Cardiovascular;  Laterality: N/A;  . LUMBAR PUNCTURE     as child  . NO PAST SURGERIES    . TEE WITHOUT CARDIOVERSION N/A 01/07/2018   Procedure: TRANSESOPHAGEAL ECHOCARDIOGRAM (TEE);  Surgeon: Minna Merritts, MD;  Location: [facility] ORS;  Service: Cardiovascular;  Laterality: N/A;    Prior to Admission medications   Medication Sig Start Date End Date Taking? Authorizing Provider  ASPIRIN LOW DOSE 81 MG EC tablet TAKE 1 TABLET(81 MG) BY MOUTH DAILY 07/28/18   [name], MD  atorvastatin (LIPITOR) 80 MG tablet Take 1 tablet (80 mg total) by mouth daily. 01/22/18 04/22/18  [name], MD  cephALEXin (KEFLEX) 500 MG capsule Take 1 capsule (500 mg total) by mouth 3 (three) times daily for 7 days. 07/28/18 08/04/18  Ashlynn Gunnels, Johnette Abraham B, FNP  clopidogrel (PLAVIX) 75 MG tablet TAKE 1 TABLET(75 MG) BY MOUTH DAILY 07/27/18   [name], MD  DULoxetine (CYMBALTA) 20 MG capsule Take 20  mg by mouth daily.  01/09/18   [provider]  glucose blood (FREESTYLE LITE) test strip Check blood sugars twice a day (Dx. 250.02) 04/18/17   [name], MD  HUMALOG 100 UNIT/ML injection INJECT 4 TO 6 UNITS UNDER THE SKIN THREE TIMES DAILY 05/29/18   [name], MD  INSULIN SYRINGE .5CC/29G 29G X 1/2" 0.5 ML MISC USE AS DIRECTED 04/02/18   [name], MD  Insulin Syringe-Needle U-100  (INSULIN SYRINGE .5CC/30GX5/16") 30G X 5/16" 0.5 ML MISC USE AS DIRECTED 07/01/18   [name], MD  Insulin Syringe-Needle U-100 27G X 1/2" 1 ML MISC Use as directed with insulin. One needle per use. 12/01/17   [name], MD  LANTUS 100 UNIT/ML injection INJECT 15 UNITS UNDER THE SKIN EVERY NIGHT AT BEDTIME 06/22/18   [name], MD  levothyroxine (SYNTHROID, LEVOTHROID) 112 MCG tablet TAKE 1 TABLET(112 MCG) BY MOUTH DAILY BEFORE BREAKFAST 07/27/18   [name], MD  losartan (COZAAR) 25 MG tablet TAKE 1 TABLET BY MOUTH  DAILY 04/17/18   [name], MD  metFORMIN (GLUCOPHAGE) 500 MG tablet TAKE 1 TABLET BY MOUTH DAILY WITH BREAKFAST 07/27/18   [name], MD  [facility] DELICA LANCETS FINE MISC 1 applicator by Other route 4 (four) times daily. 10/06/17   [provider]  predniSONE (DELTASONE) 20 MG tablet Take 3 tablets (60 mg total) by mouth daily with breakfast. 04/11/18   Hillary Bow, MD  pregabalin (LYRICA) 100 MG capsule Take 1 capsule (100 mg total) by mouth 3 (three) times daily. 03/27/18   [name], MD  valACYclovir (VALTREX) 1000 MG tablet Take 1 tablet (1,000 mg total) by mouth 3 (three) times daily. 04/10/18   Hillary Bow, MD    Allergies Patient has no known allergies.  Family History  Problem Relation Age of Onset  . Breast cancer Mother   . Diabetes Father   . Diabetes Sister   . Arthritis Maternal Grandmother   . Diabetes Maternal Grandmother     Social History Social History   Tobacco Use  . Smoking status: Never Smoker  . Smokeless tobacco: Never Used  Substance Use Topics  . Alcohol use: No    Alcohol/week: 0.0 standard drinks    Comment: very rarely (1-2/year)  . Drug use: No    Review of Systems Constitutional: Negative for fever. Cardiovascular: Negative for chest pain. Respiratory: Negative for shortness of breath. Musculoskeletal: Positive for right upper extremity pain and swelling. Skin: Positive for edema of  the right elbow and increased skin temperature in comparison to the left.  Neurological: Negative for decrease in sensation  ____________________________________________   PHYSICAL EXAM:  VITAL SIGNS: ED Triage Vitals  Enc Vitals Group     BP 07/27/18 1942 (!) 207/87     Pulse Rate 07/27/18 1942 75     Resp 07/27/18 1942 17     Temp 07/27/18 1942 97.7 F (36.5 C)     Temp Source 07/27/18 1942 Oral     SpO2 07/27/18 1942 95 %     Weight 07/27/18 1940 287 lb (130.2 kg)     Height 07/27/18 1940 6' (1.829 m)     Head Circumference --      Peak Flow --      Pain Score 07/27/18 1940 6     Pain Loc --      Pain Edu? --      Excl. in [place]? --     Constitutional: Alert and oriented. Well appearing and in  no acute distress. Eyes: Conjunctivae are clear without discharge or drainage Head: Atraumatic Neck: Supple Respiratory: No cough. Respirations are even and unlabored. Musculoskeletal: Pain upon attempt to fully extend the right elbow.  No right shoulder pain, no right wrist or hand pain. Neurologic: Motor and sensory function is intact, specifically of the right upper extremity. Skin: Skin is warmer on the right upper extremity than on the left.  There is mild nonpitting edema over the posterior elbow and distal humerus. Psychiatric: Affect and behavior are appropriate.  ____________________________________________   LABS (all labs ordered are listed, but only abnormal results are displayed)  Labs Reviewed  COMPREHENSIVE METABOLIC PANEL - Abnormal; Notable for the following components:      Result Value   Glucose, Bld 172 (*)    BUN 28 (*)    Calcium 8.6 (*)    Albumin 3.4 (*)    All other components within normal limits  CBC WITH DIFFERENTIAL/PLATELET   ____________________________________________  RADIOLOGY  [place] RUE negative for DVT or other findings of concern.  CT of the right elbow shows a moderate joint effusion with elevation of the anterior and posterior fat  pads. Skin thickening with diffuse subcutaneous edema of the dorsal aspect of the elbow which may represent cellulitis. No drainable fluid fluid collection or abscess. ____________________________________________   PROCEDURES  Procedures  ____________________________________________   INITIAL IMPRESSION / ASSESSMENT AND PLAN / ED COURSE  Normal Recinos is a 47 y.o. who presents to the emergency department for treatment and evaluation of nontraumatic right arm swelling.  He had been treated for a potential infection by his primary care provider without any improvement.  Pain increases with attempts at full extension.  ----------------------------------------- 12:57 AM on 07/28/2018 -----------------------------------------  Ultrasound is negative for DVT or other acute finding.  Will order CT without contrast.  Per patient report, x-ray was negative for any acute bony abnormality.  ----------------------------------------- 3:04 AM on 07/28/2018 -----------------------------------------  Patient to be discharged home. Will add Keflex to the doxycycline and prednisone. He is to follow up with orthopedics. Referral and contact information will be on discharge papers. He was instructed to return to the ER for symptoms of concern if unable to schedule an appointment with either primary care or orthopedics.  Medications  cephALEXin (KEFLEX) capsule 500 mg (500 mg Oral Given 07/28/18 5320)    Pertinent labs & imaging results that were available during my care of the patient were reviewed by me and considered in my medical decision making (see chart for details).  _________________________________________   FINAL CLINICAL IMPRESSION(S) / ED DIAGNOSES  Final diagnoses:  Edema  Cellulitis of right upper extremity    ED Discharge Orders         Ordered    cephALEXin (KEFLEX) 500 MG capsule  3 times daily     07/28/18 0306           If controlled substance prescribed during  this visit, 12 month history viewed on the [place] prior to issuing an initial prescription for Schedule II or III opiod.    Victorino Dike, FNP 07/28/18 1710    Nena Polio, MD 07/28/18 [phone]

## 2018-07-28 ENCOUNTER — Other Ambulatory Visit: Payer: Self-pay | Admitting: Internal Medicine

## 2018-07-28 ENCOUNTER — Emergency Department: Payer: Managed Care, Other (non HMO)

## 2018-07-28 MED ORDER — CEPHALEXIN 500 MG PO CAPS: 500.0000 mg | ORAL_CAPSULE | Freq: Three times a day (TID) | ORAL | 0 refills | Status: AC

## 2018-07-28 MED ORDER — CEPHALEXIN 500 MG PO CAPS
ORAL_CAPSULE | ORAL | Status: AC
  Administered 2018-07-28: 500 mg via ORAL
  Filled 2018-07-28: qty 1

## 2018-07-28 MED ORDER — CEPHALEXIN 500 MG PO CAPS
500.0000 mg | ORAL_CAPSULE | Freq: Once | ORAL | Status: AC
  Administered 2018-07-28: 500 mg via ORAL

## 2018-07-28 NOTE — Discharge Instructions (Signed)
Please call and schedule follow-up appointment with orthopedics.  Finish the doxycycline and take the Keflex as well.  Return to the emergency department for symptoms of change or worsen if you are unable to schedule an appointment with primary care or orthopedics.

## 2018-07-28 NOTE — ED Provider Notes (Signed)
-----------------------------------------   3:14 AM on 07/28/2018 -----------------------------------------  I personally saw this patient with NP Triplett.  In summary, this is a 47 year old gentleman with right elbow pain and swelling over the past week.  He was started on prednisone and doxycycline by his PCP.  Presents tonight with increased pain and swelling.  Ultrasound negative for DVT.  CT scan indicates cellulitis without drainable abscess.  Will place patient in sling, add Keflex to broaden his coverage and patient will follow-up with orthopedics closely.  Strict return precautions given.  Patient and spouse verbalized understanding and agree with plan of care.   Paulette Blanch, MD 07/28/18 [phone]

## 2018-08-06 ENCOUNTER — Encounter (INDEPENDENT_AMBULATORY_CARE_PROVIDER_SITE_OTHER): Payer: Managed Care, Other (non HMO)

## 2018-08-06 ENCOUNTER — Ambulatory Visit (INDEPENDENT_AMBULATORY_CARE_PROVIDER_SITE_OTHER): Payer: Managed Care, Other (non HMO) | Admitting: Vascular Surgery

## 2018-08-13 ENCOUNTER — Ambulatory Visit (INDEPENDENT_AMBULATORY_CARE_PROVIDER_SITE_OTHER): Payer: Managed Care, Other (non HMO)

## 2018-08-13 DIAGNOSIS — I639 Cerebral infarction, unspecified: Secondary | ICD-10-CM

## 2018-08-14 NOTE — Progress Notes (Signed)
[facility] Summary Report / Loop Recorder 

## 2018-08-18 ENCOUNTER — Other Ambulatory Visit: Payer: Self-pay | Admitting: Internal Medicine

## 2018-08-30 LAB — CUP PACEART REMOTE DEVICE CHECK
Date Time Interrogation Session: 20191116144056
Implantable Pulse Generator Implant Date: 20190604

## 2018-09-01 ENCOUNTER — Ambulatory Visit: Payer: Managed Care, Other (non HMO) | Admitting: Internal Medicine

## 2018-09-02 ENCOUNTER — Other Ambulatory Visit: Payer: Self-pay | Admitting: Internal Medicine

## 2018-09-03 ENCOUNTER — Ambulatory Visit (INDEPENDENT_AMBULATORY_CARE_PROVIDER_SITE_OTHER): Payer: Managed Care, Other (non HMO) | Admitting: Vascular Surgery

## 2018-09-03 ENCOUNTER — Encounter (INDEPENDENT_AMBULATORY_CARE_PROVIDER_SITE_OTHER): Payer: Managed Care, Other (non HMO)

## 2018-09-10 ENCOUNTER — Other Ambulatory Visit: Payer: Self-pay | Admitting: Internal Medicine

## 2018-09-11 ENCOUNTER — Other Ambulatory Visit: Payer: Self-pay | Admitting: Internal Medicine

## 2018-09-13 LAB — CUP PACEART REMOTE DEVICE CHECK
Date Time Interrogation Session: 20191219151055
Implantable Pulse Generator Implant Date: 20190604

## 2018-09-15 ENCOUNTER — Ambulatory Visit (INDEPENDENT_AMBULATORY_CARE_PROVIDER_SITE_OTHER): Payer: Managed Care, Other (non HMO)

## 2018-09-15 DIAGNOSIS — I639 Cerebral infarction, unspecified: Secondary | ICD-10-CM | POA: Diagnosis not present

## 2018-09-16 LAB — CUP PACEART REMOTE DEVICE CHECK
Date Time Interrogation Session: 20200121171011
Implantable Pulse Generator Implant Date: 20190604

## 2018-09-16 NOTE — Progress Notes (Signed)
[facility] Summary Report / Loop Recorder 

## 2018-10-03 ENCOUNTER — Encounter: Payer: Self-pay | Admitting: Internal Medicine

## 2018-10-05 ENCOUNTER — Telehealth: Payer: Self-pay | Admitting: Internal Medicine

## 2018-10-05 ENCOUNTER — Other Ambulatory Visit: Payer: Self-pay

## 2018-10-05 MED ORDER — PREGABALIN 100 MG PO CAPS: 100.0000 mg | ORAL_CAPSULE | Freq: Three times a day (TID) | ORAL | 1 refills | Status: DC

## 2018-10-05 NOTE — Telephone Encounter (Signed)
Copied from [place] [phone]. Topic: Appointment Scheduling - Scheduling Inquiry for Clinic >> Oct 05, 2018 10:55 AM Blase Mess A wrote: Reason for CRM: Patient wife is calling to see if the patient can get in sooner for diabetic issues. Please advise [phone]

## 2018-10-05 NOTE — Progress Notes (Signed)
Last OV 06/09/2018 Next OV 11/27/18 Last refill 03/27/18   Initial ordered printed by nurse.  She cancelled this rx.  I ok'd lyrica #[id number] with 1 refill.    Dr Nicki Reaper

## 2018-10-06 ENCOUNTER — Ambulatory Visit: Payer: Self-pay

## 2018-10-06 ENCOUNTER — Other Ambulatory Visit: Payer: Self-pay

## 2018-10-06 ENCOUNTER — Emergency Department: Payer: Managed Care, Other (non HMO)

## 2018-10-06 ENCOUNTER — Inpatient Hospital Stay
Admission: EM | Admit: 2018-10-06 | Discharge: 2018-10-08 | DRG: 638 | Disposition: A | Discharge status: Home / Self Care | Payer: Managed Care, Other (non HMO) | Attending: Internal Medicine | Admitting: Internal Medicine

## 2018-10-06 ENCOUNTER — Encounter: Payer: Self-pay | Admitting: Emergency Medicine

## 2018-10-06 DIAGNOSIS — E11628 Type 2 diabetes mellitus with other skin complications: Secondary | ICD-10-CM | POA: Diagnosis present

## 2018-10-06 DIAGNOSIS — Z803 Family history of malignant neoplasm of breast: Secondary | ICD-10-CM | POA: Diagnosis not present

## 2018-10-06 DIAGNOSIS — L03116 Cellulitis of left lower limb: Secondary | ICD-10-CM | POA: Diagnosis present

## 2018-10-06 DIAGNOSIS — Z8261 Family history of arthritis: Secondary | ICD-10-CM

## 2018-10-06 DIAGNOSIS — Z79890 Hormone replacement therapy (postmenopausal): Secondary | ICD-10-CM | POA: Diagnosis not present

## 2018-10-06 DIAGNOSIS — I10 Essential (primary) hypertension: Secondary | ICD-10-CM | POA: Diagnosis present

## 2018-10-06 DIAGNOSIS — Z8673 Personal history of transient ischemic attack (TIA), and cerebral infarction without residual deficits: Secondary | ICD-10-CM

## 2018-10-06 DIAGNOSIS — B9562 Methicillin resistant Staphylococcus aureus infection as the cause of diseases classified elsewhere: Secondary | ICD-10-CM | POA: Diagnosis present

## 2018-10-06 DIAGNOSIS — N289 Disorder of kidney and ureter, unspecified: Secondary | ICD-10-CM

## 2018-10-06 DIAGNOSIS — L03119 Cellulitis of unspecified part of limb: Secondary | ICD-10-CM

## 2018-10-06 DIAGNOSIS — L97529 Non-pressure chronic ulcer of other part of left foot with unspecified severity: Secondary | ICD-10-CM | POA: Diagnosis present

## 2018-10-06 DIAGNOSIS — E1140 Type 2 diabetes mellitus with diabetic neuropathy, unspecified: Secondary | ICD-10-CM | POA: Diagnosis present

## 2018-10-06 DIAGNOSIS — Z6838 Body mass index (BMI) 38.0-38.9, adult: Secondary | ICD-10-CM | POA: Diagnosis not present

## 2018-10-06 DIAGNOSIS — Z7984 Long term (current) use of oral hypoglycemic drugs: Secondary | ICD-10-CM

## 2018-10-06 DIAGNOSIS — Z7902 Long term (current) use of antithrombotics/antiplatelets: Secondary | ICD-10-CM | POA: Diagnosis not present

## 2018-10-06 DIAGNOSIS — Z794 Long term (current) use of insulin: Secondary | ICD-10-CM | POA: Diagnosis not present

## 2018-10-06 DIAGNOSIS — E11621 Type 2 diabetes mellitus with foot ulcer: Secondary | ICD-10-CM | POA: Diagnosis present

## 2018-10-06 DIAGNOSIS — Z833 Family history of diabetes mellitus: Secondary | ICD-10-CM | POA: Diagnosis not present

## 2018-10-06 DIAGNOSIS — L97421 Non-pressure chronic ulcer of left heel and midfoot limited to breakdown of skin: Secondary | ICD-10-CM

## 2018-10-06 DIAGNOSIS — E039 Hypothyroidism, unspecified: Secondary | ICD-10-CM | POA: Diagnosis present

## 2018-10-06 DIAGNOSIS — L089 Local infection of the skin and subcutaneous tissue, unspecified: Secondary | ICD-10-CM | POA: Diagnosis present

## 2018-10-06 DIAGNOSIS — E669 Obesity, unspecified: Secondary | ICD-10-CM | POA: Diagnosis present

## 2018-10-06 LAB — CBC WITH DIFFERENTIAL/PLATELET
Abs Immature Granulocytes: 0.02 10*3/uL (ref 0.00–0.07)
Basophils Absolute: 0 10*3/uL (ref 0.0–0.1)
Basophils Relative: 0 %
Eosinophils Absolute: 0.1 10*3/uL (ref 0.0–0.5)
Eosinophils Relative: 2 %
HCT: 37.2 % — ABNORMAL LOW (ref 39.0–52.0)
Hemoglobin: 12.5 g/dL — ABNORMAL LOW (ref 13.0–17.0)
Immature Granulocytes: 0 %
Lymphocytes Relative: 20 %
Lymphs Abs: 1.6 10*3/uL (ref 0.7–4.0)
MCH: 29.1 pg (ref 26.0–34.0)
MCHC: 33.6 g/dL (ref 30.0–36.0)
MCV: 86.7 fL (ref 80.0–100.0)
Monocytes Absolute: 0.7 10*3/uL (ref 0.1–1.0)
Monocytes Relative: 9 %
Neutro Abs: 5.3 10*3/uL (ref 1.7–7.7)
Neutrophils Relative %: 69 %
Platelets: 223 10*3/uL (ref 150–400)
RBC: 4.29 MIL/uL (ref 4.22–5.81)
RDW: 11.9 % (ref 11.5–15.5)
WBC: 7.7 10*3/uL (ref 4.0–10.5)
nRBC: 0 % (ref 0.0–0.2)

## 2018-10-06 LAB — COMPREHENSIVE METABOLIC PANEL
ALT: 13 U/L (ref 0–44)
AST: 15 U/L (ref 15–41)
Albumin: 3 g/dL — ABNORMAL LOW (ref 3.5–5.0)
Alkaline Phosphatase: 75 U/L (ref 38–126)
Anion gap: 4 — ABNORMAL LOW (ref 5–15)
BUN: 25 mg/dL — ABNORMAL HIGH (ref 6–20)
CO2: 28 mmol/L (ref 22–32)
Calcium: 8.6 mg/dL — ABNORMAL LOW (ref 8.9–10.3)
Chloride: 106 mmol/L (ref 98–111)
Creatinine, Ser: 1.29 mg/dL — ABNORMAL HIGH (ref 0.61–1.24)
GFR calc Af Amer: >60 mL/min (ref >60)
GFR calc non Af Amer: >60 mL/min (ref >60)
Glucose, Bld: 193 mg/dL — ABNORMAL HIGH (ref 70–99)
Potassium: 4.5 mmol/L (ref 3.5–5.1)
Sodium: 138 mmol/L (ref 135–145)
Total Bilirubin: 0.7 mg/dL (ref 0.3–1.2)
Total Protein: 7 g/dL (ref 6.5–8.1)

## 2018-10-06 LAB — GLUCOSE, CAPILLARY
Glucose-Capillary: 151 mg/dL — ABNORMAL HIGH (ref 70–99)
Glucose-Capillary: 213 mg/dL — ABNORMAL HIGH (ref 70–99)

## 2018-10-06 LAB — SEDIMENTATION RATE: Sed Rate: 64 mm/hr — ABNORMAL HIGH (ref 0–15)

## 2018-10-06 LAB — HEMOGLOBIN A1C
Hgb A1c MFr Bld: 8 % — ABNORMAL HIGH (ref 4.8–5.6)
Mean Plasma Glucose: 182.9 mg/dL

## 2018-10-06 MED ORDER — SODIUM CHLORIDE 0.9 % IV SOLN
3.0000 g | Freq: Four times a day (QID) | INTRAVENOUS | Status: DC
  Administered 2018-10-06 (×2): 3 g via INTRAVENOUS
  Filled 2018-10-06 (×5): qty 3

## 2018-10-06 MED ORDER — ACETAMINOPHEN 325 MG PO TABS: 650.0000 mg | ORAL_TABLET | Freq: Four times a day (QID) | ORAL | Status: DC | PRN

## 2018-10-06 MED ORDER — INSULIN GLARGINE 100 UNIT/ML ~~LOC~~ SOLN
20.0000 [IU] | Freq: Every day | SUBCUTANEOUS | Status: DC
  Administered 2018-10-06 – 2018-10-07 (×2): 20 [IU] via SUBCUTANEOUS
  Filled 2018-10-06 (×3): qty 0.2

## 2018-10-06 MED ORDER — INSULIN ASPART 100 UNIT/ML ~~LOC~~ SOLN
0.0000 [IU] | Freq: Three times a day (TID) | SUBCUTANEOUS | Status: DC
  Administered 2018-10-06: 3 [IU] via SUBCUTANEOUS
  Administered 2018-10-07: 5 [IU] via SUBCUTANEOUS
  Administered 2018-10-07 – 2018-10-08 (×3): 3 [IU] via SUBCUTANEOUS
  Filled 2018-10-06 (×5): qty 1

## 2018-10-06 MED ORDER — BISACODYL 10 MG RE SUPP: 10.0000 mg | Freq: Every day | RECTAL | Status: DC | PRN

## 2018-10-06 MED ORDER — ASPIRIN EC 81 MG PO TBEC
81.0000 mg | DELAYED_RELEASE_TABLET | Freq: Every day | ORAL | Status: DC
  Administered 2018-10-07 – 2018-10-08 (×2): 81 mg via ORAL
  Filled 2018-10-06 (×2): qty 1

## 2018-10-06 MED ORDER — ENOXAPARIN SODIUM 40 MG/0.4ML ~~LOC~~ SOLN
40.0000 mg | SUBCUTANEOUS | Status: DC
  Administered 2018-10-06 – 2018-10-07 (×2): 40 mg via SUBCUTANEOUS
  Filled 2018-10-06 (×2): qty 0.4

## 2018-10-06 MED ORDER — PREGABALIN 50 MG PO CAPS
100.0000 mg | ORAL_CAPSULE | Freq: Three times a day (TID) | ORAL | Status: DC
  Administered 2018-10-06 – 2018-10-08 (×6): 100 mg via ORAL
  Filled 2018-10-06 (×6): qty 2

## 2018-10-06 MED ORDER — CLOPIDOGREL BISULFATE 75 MG PO TABS
75.0000 mg | ORAL_TABLET | Freq: Every day | ORAL | Status: DC
  Administered 2018-10-07 – 2018-10-08 (×2): 75 mg via ORAL
  Filled 2018-10-06 (×2): qty 1

## 2018-10-06 MED ORDER — ONDANSETRON HCL 4 MG/2ML IJ SOLN: 4.0000 mg | Freq: Four times a day (QID) | INTRAMUSCULAR | Status: DC | PRN

## 2018-10-06 MED ORDER — SODIUM CHLORIDE 0.9 % IV BOLUS
1000.0000 mL | Freq: Once | INTRAVENOUS | Status: AC
  Administered 2018-10-06: 1000 mL via INTRAVENOUS

## 2018-10-06 MED ORDER — POLYETHYLENE GLYCOL 3350 17 G PO PACK: 17.0000 g | PACK | Freq: Every day | ORAL | Status: DC | PRN

## 2018-10-06 MED ORDER — DULOXETINE HCL 20 MG PO CPEP
20.0000 mg | ORAL_CAPSULE | Freq: Every day | ORAL | Status: DC
  Administered 2018-10-06 – 2018-10-07 (×2): 20 mg via ORAL
  Filled 2018-10-06 (×4): qty 1

## 2018-10-06 MED ORDER — ONDANSETRON HCL 4 MG PO TABS: 4.0000 mg | ORAL_TABLET | Freq: Four times a day (QID) | ORAL | Status: DC | PRN

## 2018-10-06 MED ORDER — VANCOMYCIN HCL 10 G IV SOLR
2000.0000 mg | Freq: Once | INTRAVENOUS | Status: AC
  Administered 2018-10-06: 2000 mg via INTRAVENOUS
  Filled 2018-10-06: qty 2000

## 2018-10-06 MED ORDER — ACETAMINOPHEN 650 MG RE SUPP: 650.0000 mg | Freq: Four times a day (QID) | RECTAL | Status: DC | PRN

## 2018-10-06 MED ORDER — METFORMIN HCL 500 MG PO TABS
500.0000 mg | ORAL_TABLET | Freq: Every day | ORAL | Status: DC
  Administered 2018-10-07 – 2018-10-08 (×2): 500 mg via ORAL
  Filled 2018-10-06 (×2): qty 1

## 2018-10-06 MED ORDER — SODIUM CHLORIDE 0.9 % IV SOLN
3.0000 g | Freq: Four times a day (QID) | INTRAVENOUS | Status: DC
  Administered 2018-10-07 – 2018-10-08 (×6): 3 g via INTRAVENOUS
  Filled 2018-10-06 (×11): qty 3

## 2018-10-06 MED ORDER — LEVOTHYROXINE SODIUM 112 MCG PO TABS
112.0000 ug | ORAL_TABLET | Freq: Every day | ORAL | Status: DC
  Administered 2018-10-07 – 2018-10-08 (×2): 112 ug via ORAL
  Filled 2018-10-06 (×2): qty 1

## 2018-10-06 MED ORDER — ATORVASTATIN CALCIUM 80 MG PO TABS
80.0000 mg | ORAL_TABLET | Freq: Every day | ORAL | Status: DC
  Administered 2018-10-06 – 2018-10-07 (×2): 80 mg via ORAL
  Filled 2018-10-06 (×3): qty 1
  Filled 2018-10-06: qty 4

## 2018-10-06 MED ORDER — LOSARTAN POTASSIUM 50 MG PO TABS
50.0000 mg | ORAL_TABLET | Freq: Every day | ORAL | Status: DC
  Administered 2018-10-07 – 2018-10-08 (×2): 50 mg via ORAL
  Filled 2018-10-06 (×2): qty 1

## 2018-10-06 NOTE — ED Notes (Signed)
ED TO INPATIENT HANDOFF REPORT  Name/Age/Gender Gregory Crane 48 y.o. male  Code Status Code Status History    Date Active Date Inactive Code Status Order ID Comments User Context   04/10/2018 0034 04/10/2018 2208 Full Code [id number]  Amelia Jo, MD Inpatient   01/06/2018 0423 01/07/2018 1635 Full Code [id number]  Arta Silence, MD Inpatient   09/12/2017 2347 09/14/2017 1444 Full Code [id number]  Amelia Jo, MD ED   10/20/2016 2044 10/21/2016 1529 Full Code [id number]  Gladstone Lighter, MD Inpatient      Home/SNF/Other Home  Chief Complaint leg swelling;redness  Level of Care/Admitting Diagnosis ED Disposition    ED Disposition Condition [place]: [place] [[id number]]  Level of Care: Med-Surg [16]  Diagnosis: Diabetic foot infection ([place]) [115726]  Admitting Physician: Odessa Fleming  Attending Physician: Odessa Fleming  Estimated length of stay: past midnight tomorrow  Certification:: I certify this patient will need inpatient services for at least 2 midnights  PT Class (Do Not Modify): Inpatient [101]  PT Acc Code (Do Not Modify): Private [1]       Medical History Past Medical History:  Diagnosis Date  . Allergy   . Bell's palsy   . Diabetes mellitus without complication (HCC)    diet controlled  . Hypertension   . Hypothyroidism   . Kidney stones   . Stroke [facility])     Allergies No Known Allergies  IV Location/Drains/Wounds Patient Lines/Drains/Airways Status   Active Line/Drains/Airways    Name:   Placement date:   Placement time:   Site:   Days:   Peripheral IV 10/06/18 Right Antecubital   10/06/18    1045    Antecubital   less than 1          Labs/Imaging Results for orders placed or performed during the hospital encounter of 10/06/18 (from the past 48 hour(s))  Comprehensive metabolic panel     Status: Abnormal   Collection Time: 10/06/18  9:45 AM  Result Value Ref Range   Sodium 138 135  - 145 mmol/L   Potassium 4.5 3.5 - 5.1 mmol/L   Chloride 106 98 - 111 mmol/L   CO2 28 22 - 32 mmol/L   Glucose, Bld 193 (H) 70 - 99 mg/dL   BUN 25 (H) 6 - 20 mg/dL   Creatinine, Ser 1.29 (H) 0.61 - 1.24 mg/dL   Calcium 8.6 (L) 8.9 - 10.3 mg/dL   Total Protein 7.0 6.5 - 8.1 g/dL   Albumin 3.0 (L) 3.5 - 5.0 g/dL   AST 15 15 - 41 U/L   ALT 13 0 - 44 U/L   Alkaline Phosphatase 75 38 - 126 U/L   Total Bilirubin 0.7 0.3 - 1.2 mg/dL   GFR calc non Af Amer >60 >60 mL/min   GFR calc Af Amer >60 >60 mL/min   Anion gap 4 (L) 5 - 15    Comment: Performed at [facility], [address] [place], [place] [address]  CBC with Differential     Status: Abnormal   Collection Time: 10/06/18  9:45 AM  Result Value Ref Range   WBC 7.7 4.0 - 10.5 K/uL   RBC 4.29 4.22 - 5.81 MIL/uL   Hemoglobin 12.5 (L) 13.0 - 17.0 g/dL   HCT 37.2 (L) 39.0 - 52.0 %   MCV 86.7 80.0 - 100.0 fL   MCH 29.1 26.0 - 34.0 pg   MCHC 33.6 30.0 - 36.0 g/dL  RDW 11.9 11.5 - 15.5 %   Platelets 223 150 - 400 K/uL   nRBC 0.0 0.0 - 0.2 %   Neutrophils Relative % 69 %   Neutro Abs 5.3 1.7 - 7.7 K/uL   Lymphocytes Relative 20 %   Lymphs Abs 1.6 0.7 - 4.0 K/uL   Monocytes Relative 9 %   Monocytes Absolute 0.7 0.1 - 1.0 K/uL   Eosinophils Relative 2 %   Eosinophils Absolute 0.1 0.0 - 0.5 K/uL   Basophils Relative 0 %   Basophils Absolute 0.0 0.0 - 0.1 K/uL   Immature Granulocytes 0 %   Abs Immature Granulocytes 0.02 0.00 - 0.07 K/uL    Comment: Performed at [facility], [address] [place], [place] [address]  Sedimentation rate     Status: Abnormal   Collection Time: 10/06/18  9:45 AM  Result Value Ref Range   Sed Rate 64 (H) 0 - 15 mm/hr    Comment: Performed at [facility], [address] [place], [place] [address]   US Venous Img Lower Unilateral Left  Result Date: 10/06/2018 CLINICAL DATA:  Left lower extremity pain and edema for the past 3 days. Evaluate for DVT. EXAM: LEFT LOWER  EXTREMITY VENOUS DOPPLER ULTRASOUND TECHNIQUE: Gray-scale sonography with graded compression, as well as color Doppler and duplex ultrasound were performed to evaluate the lower extremity deep venous systems from the level of the common femoral vein and including the common femoral, femoral, profunda femoral, popliteal and calf veins including the posterior tibial, peroneal and gastrocnemius veins when visible. The superficial great saphenous vein was also interrogated. Spectral Doppler was utilized to evaluate flow at rest and with distal augmentation maneuvers in the common femoral, femoral and popliteal veins. COMPARISON:  None. FINDINGS: Contralateral Common Femoral Vein: Respiratory phasicity is normal and symmetric with the symptomatic side. No evidence of thrombus. Normal compressibility. Common Femoral Vein: No evidence of thrombus. Normal compressibility, respiratory phasicity and response to augmentation. Saphenofemoral Junction: No evidence of thrombus. Normal compressibility and flow on color Doppler imaging. Profunda Femoral Vein: No evidence of thrombus. Normal compressibility and flow on color Doppler imaging. Femoral Vein: No evidence of thrombus. Normal compressibility, respiratory phasicity and response to augmentation. Popliteal Vein: No evidence of thrombus. Normal compressibility, respiratory phasicity and response to augmentation. Calf Veins: No evidence of thrombus. Normal compressibility and flow on color Doppler imaging. Superficial Great Saphenous Vein: No evidence of thrombus. Normal compressibility. Venous Reflux:  None. Other Findings: Subcutaneous edema is noted at the level of the left lower leg and calf. IMPRESSION: No evidence of DVT within the left lower extremity. Electronically Signed   By: Sandi Mariscal M.D.   On: 10/06/2018 11:41   Dg Foot Complete Left  Result Date: 10/06/2018 CLINICAL DATA:  Left foot pain and swelling.  Foot ulcer. EXAM: LEFT FOOT - COMPLETE 3+ VIEW  COMPARISON:  None. FINDINGS: There is no evidence of fracture or dislocation. There is no evidence of arthropathy or other focal bone abnormality. No lytic destruction is seen to suggest osteomyelitis. Dorsal soft tissue swelling is noted. IMPRESSION: No fracture or dislocation is noted. No lytic destruction is seen to suggest osteomyelitis. Dorsal soft tissue swelling is noted suggesting cellulitis or other inflammation. Electronically Signed   By: Marijo Conception, M.D.   On: 10/06/2018 11:38    Pending Labs Unresulted Labs (From admission, onward)    Start     Ordered   10/06/18 1202  Hemoglobin A1c  Once,  STAT    Comments:  To assess prior glycemic control    10/06/18 1201   Signed and Held  CBC  (enoxaparin (LOVENOX)    CrCl >/= 30 ml/min)  Once,   R    Comments:  Baseline for enoxaparin therapy IF NOT ALREADY DRAWN.  Notify MD if PLT < 100 K.    Signed and Held   Signed and Held  Creatinine, serum  (enoxaparin (LOVENOX)    CrCl >/= 30 ml/min)  Once,   R    Comments:  Baseline for enoxaparin therapy IF NOT ALREADY DRAWN.    Signed and Held   Signed and Held  Creatinine, serum  (enoxaparin (LOVENOX)    CrCl >/= 30 ml/min)  Weekly,   R    Comments:  while on enoxaparin therapy    Signed and Held          Vitals/Pain Today's Vitals   10/06/18 0941 10/06/18 0946 10/06/18 1030 10/06/18 1206  BP:  (!) 169/77 (!) 164/82 (!) 158/73  Pulse:  81 75 75  Resp:  18 17 20   Temp:  97.7 F (36.5 C)    TempSrc:  Oral    SpO2:  98% 96% 96%  Weight: 129.3 kg     Height: 6' (1.829 m)     PainSc:        Isolation Precautions No active isolations  Medications Medications  vancomycin (VANCOCIN) 2,000 mg in sodium chloride 0.9 % 500 mL IVPB (2,000 mg Intravenous New Bag/Given 10/06/18 1144)  insulin aspart (novoLOG) injection 0-15 Units (has no administration in time range)  Ampicillin-Sulbactam (UNASYN) 3 g in sodium chloride 0.9 % 100 mL IVPB (has no administration in time range)   sodium chloride 0.9 % bolus 1,000 mL (0 mLs Intravenous Stopped 10/06/18 1202)    Mobility walks

## 2018-10-06 NOTE — ED Notes (Signed)
Unable to start abx prior to pt leaving for test. Do not have from pharmacy

## 2018-10-06 NOTE — Consult Note (Signed)
[facility] Podiatry                                                      Patient Demographics  Gregory Crane, is a 48 y.o. male   MRN: [id number]   DOB - 07-13-1971  Admit Date - 10/06/2018    Outpatient Primary MD for the patient is [name], MD  Consult requested in the Hospital by Fritzi Mandes, MD, On 10/06/2018    Reason for consult diabetic ulcer with cellulitis left foot   With History of -  Past Medical History:  Diagnosis Date  . Allergy   . Bell's palsy   . Diabetes mellitus without complication (HCC)    diet controlled  . Hypertension   . Hypothyroidism   . Kidney stones   . Stroke [facility])       Past Surgical History:  Procedure Laterality Date  . LOOP RECORDER INSERTION N/A 01/27/2018   Procedure: LOOP RECORDER INSERTION;  Surgeon: [name], MD;  Location: [place] CV LAB;  Service: Cardiovascular;  Laterality: N/A;  . LUMBAR PUNCTURE     as child  . NO PAST SURGERIES    . TEE WITHOUT CARDIOVERSION N/A 01/07/2018   Procedure: TRANSESOPHAGEAL ECHOCARDIOGRAM (TEE);  Surgeon: Minna Merritts, MD;  Location: [facility] ORS;  Service: Cardiovascular;  Laterality: N/A;    in for   Chief Complaint  Patient presents with  . Leg Swelling     HPI  Gregory Crane  is a 48 y.o. male, patient states that he pulled a piece of loose skin off the bottom of his left foot a few days ago did not think it thing was going on but yesterday and last night noticed some extensive swelling and redness to the left foot.  Presented to the ED at that time frame.    Review of Systems he is alert pleasant well oriented.  Does have some obesity.  In addition to the HPI above,  No Fever-chills, No Headache, No changes with Vision or hearing, No problems swallowing food or Liquids, No Chest pain, Cough or Shortness of Breath, No Abdominal pain, No Nausea  or Vommitting, Bowel movements are regular, No Blood in stool or Urine, No dysuria, No new skin rashes or bruises, No new joints pains-aches,  No new weakness, tingling, numbness in any extremity, No recent weight gain or loss, No polyuria, polydypsia or polyphagia, No significant Mental Stressors.  A full 10 point Review of Systems was done, except as stated above, all other Review of Systems were negative.   Social History Social History   Tobacco Use  . Smoking status: Never Smoker  . Smokeless tobacco: Never Used  Substance Use Topics  . Alcohol use: No    Alcohol/week: 0.0 standard drinks    Comment: very rarely (1-2/year)    Family History Family History  Problem Relation Age of Onset  . Breast cancer Mother   . Diabetes Father   . Diabetes Sister   . Arthritis Maternal Grandmother   . Diabetes Maternal Grandmother     Prior to Admission medications   Medication Sig Start Date End Date Taking? Authorizing Provider  ASPIRIN LOW DOSE 81 MG EC tablet TAKE 1 TABLET(81 MG) BY MOUTH DAILY 07/28/18  Yes [name], MD  atorvastatin (LIPITOR) 80 MG tablet Take 80 mg  by mouth Nightly.  07/15/18  Yes [provider]  clopidogrel (PLAVIX) 75 MG tablet TAKE 1 TABLET(75 MG) BY MOUTH DAILY 07/27/18  Yes [name], MD  DULoxetine (CYMBALTA) 20 MG capsule Take 20 mg by mouth daily.  01/09/18  Yes [provider]  HUMALOG 100 UNIT/ML injection INJECT 4 TO 6 UNITS UNDER THE SKIN THREE TIMES DAILY 08/18/18  Yes [name], MD  LANTUS 100 UNIT/ML injection INJECT 15 UNITS UNDER THE SKIN EVERY NIGHT AT BEDTIME 09/10/18  Yes [name], Randell Patient, MD  levothyroxine (SYNTHROID, LEVOTHROID) 112 MCG tablet TAKE 1 TABLET(112 MCG) BY MOUTH DAILY BEFORE BREAKFAST 07/27/18  Yes [name], MD  losartan (COZAAR) 50 MG tablet TAKE 1 TABLET(50 MG) BY MOUTH DAILY 09/02/18  Yes [name], MD  metFORMIN (GLUCOPHAGE) 500 MG tablet TAKE 1 TABLET BY MOUTH DAILY WITH  BREAKFAST 07/27/18  Yes [name], MD  pregabalin (LYRICA) 100 MG capsule Take 1 capsule (100 mg total) by mouth 3 (three) times daily. 10/05/18  Yes [name], MD  atorvastatin (LIPITOR) 80 MG tablet Take 1 tablet (80 mg total) by mouth daily. 01/22/18 04/22/18  [name], MD  glucose blood (FREESTYLE LITE) test strip Check blood sugars twice a day (Dx. 250.02) 04/18/17   [name], MD  INSULIN SYRINGE .5CC/29G 29G X 1/2" 0.5 ML MISC USE AS DIRECTED 04/02/18   [name], MD  Insulin Syringe-Needle U-100 (INSULIN SYRINGE .5CC/30GX5/16") 30G X 5/16" 0.5 ML MISC USE AS DIRECTED 07/01/18   [name], MD  Insulin Syringe-Needle U-100 27G X 1/2" 1 ML MISC Use as directed with insulin. One needle per use. 12/01/17   [name], MD  [facility] DELICA LANCETS FINE MISC 1 applicator by Other route 4 (four) times daily. 10/06/17   [provider]    Anti-infectives (From admission, onward)   Start     Dose/Rate Route Frequency Ordered Stop   10/06/18 1400  Ampicillin-Sulbactam (UNASYN) 3 g in sodium chloride 0.9 % 100 mL IVPB     3 g 200 mL/hr over 30 Minutes Intravenous Every 6 hours 10/06/18 1318     10/06/18 1400  Ampicillin-Sulbactam (UNASYN) 3 g in sodium chloride 0.9 % 100 mL IVPB     3 g 200 mL/hr over 30 Minutes Intravenous Every 6 hours 10/06/18 1211     10/06/18 1045  vancomycin (VANCOCIN) 2,000 mg in sodium chloride 0.9 % 500 mL IVPB     2,000 mg 250 mL/hr over 120 Minutes Intravenous  Once 10/06/18 1040        Scheduled Meds: . [START ON 10/07/2018] aspirin EC  81 mg Oral Daily  . atorvastatin  80 mg Oral q1800  . [START ON 10/07/2018] clopidogrel  75 mg Oral Daily  . DULoxetine  20 mg Oral QHS  . enoxaparin (LOVENOX) injection  40 mg Subcutaneous Q24H  . insulin aspart  0-15 Units Subcutaneous TID WC  . insulin glargine  20 Units Subcutaneous QHS  . [START ON 10/07/2018] levothyroxine  112 mcg Oral Q0600  . [START ON 10/07/2018] losartan  50 mg  Oral Daily  . [START ON 10/07/2018] metFORMIN  500 mg Oral Q breakfast  . pregabalin  100 mg Oral TID   Continuous Infusions: . ampicillin-sulbactam (UNASYN) IV    . ampicillin-sulbactam (UNASYN) IV    . vancomycin 2,000 mg (10/06/18 1144)   PRN Meds:.acetaminophen **OR** acetaminophen, bisacodyl, ondansetron **OR** ondansetron (ZOFRAN) IV, polyethylene glycol  No Known Allergies  Physical Exam  Vitals  Blood pressure Marland Kitchen)  157/75, pulse 79, temperature 97.7 F (36.5 C), temperature source Oral, resp. rate 18, height 6' (1.829 m), weight 129.3 kg, SpO2 96 %.  Lower Extremity exam:  Vascular: DP pulses are palpable but they seem diminished.  He does have some edema and both feet and legs makes little more difficult to palpate at this timeframe.  Capillary refill times are within normal limits and skin is warm dry and intact except for the plantar ulcer.  Dermatological: Plantar ulcer left fifth metatarsal head area.  Length is about 8 mm width is about 2 cm in depth is only a few millimeters.  There is mostly a granular base with some dryness to it does not appear to be a full-thickness wound does not appear to penetrate through all the skin layers.  Patient does have redness and cellulitis to the foot that extends up around to the ankle and leg.  Some this could be associated with venous dermatitis but cellulitis is also noted around the fifth metatarsal region both plantarly laterally and dorsally.  Neurological: Peripheral neuropathy secondary to chronic diabetes.  Ortho: No gross deformities.  Data Review  CBC Recent Labs  Lab 10/06/18 0945  WBC 7.7  HGB 12.5*  HCT 37.2*  PLT 223  MCV 86.7  MCH 29.1  MCHC 33.6  RDW 11.9  LYMPHSABS 1.6  MONOABS 0.7  EOSABS 0.1  BASOSABS 0.0   ------------------------------------------------------------------------------------------------------------------  Chemistries  Recent Labs  Lab 10/06/18 0945  NA 138  K 4.5  CL 106  CO2  28  GLUCOSE 193*  BUN 25*  CREATININE 1.29*  CALCIUM 8.6*  AST 15  ALT 13  ALKPHOS 75  BILITOT 0.7   ------------------------------------------------------------------------------------------------------------------ estimated creatinine clearance is 97.4 mL/min (A) (by C-G formula based on SCr of 1.29 mg/dL (H)). ------------------------------------------------------------------------------------------------------------------ No results for input(s): TSH, T4TOTAL, T3FREE, THYROIDAB in the last 72 hours.  Invalid input(s): FREET3 Urinalysis    Component Value Date/Time   COLORURINE YELLOW (A) 09/12/2017 1722   APPEARANCEUR HAZY (A) 09/12/2017 1722   APPEARANCEUR Clear 01/27/2014 0247   LABSPEC 1.020 09/12/2017 1722   LABSPEC 1.033 01/27/2014 0247   PHURINE 5.0 09/12/2017 1722   GLUCOSEU >=500 (A) 09/12/2017 1722   GLUCOSEU >=500 01/27/2014 0247   HGBUR SMALL (A) 09/12/2017 1722   BILIRUBINUR NEGATIVE 09/12/2017 1722   BILIRUBINUR Negative 01/27/2014 0247   KETONESUR 20 (A) 09/12/2017 1722   PROTEINUR 100 (A) 09/12/2017 1722   NITRITE NEGATIVE 09/12/2017 1722   LEUKOCYTESUR NEGATIVE 09/12/2017 1722   LEUKOCYTESUR Negative 01/27/2014 0247     Imaging results:   US Venous Img Lower Unilateral Left  Result Date: 10/06/2018 CLINICAL DATA:  Left lower extremity pain and edema for the past 3 days. Evaluate for DVT. EXAM: LEFT LOWER EXTREMITY VENOUS DOPPLER ULTRASOUND TECHNIQUE: Gray-scale sonography with graded compression, as well as color Doppler and duplex ultrasound were performed to evaluate the lower extremity deep venous systems from the level of the common femoral vein and including the common femoral, femoral, profunda femoral, popliteal and calf veins including the posterior tibial, peroneal and gastrocnemius veins when visible. The superficial great saphenous vein was also interrogated. Spectral Doppler was utilized to evaluate flow at rest and with distal augmentation  maneuvers in the common femoral, femoral and popliteal veins. COMPARISON:  None. FINDINGS: Contralateral Common Femoral Vein: Respiratory phasicity is normal and symmetric with the symptomatic side. No evidence of thrombus. Normal compressibility. Common Femoral Vein: No evidence of thrombus. Normal compressibility, respiratory phasicity and response to augmentation.  Saphenofemoral Junction: No evidence of thrombus. Normal compressibility and flow on color Doppler imaging. Profunda Femoral Vein: No evidence of thrombus. Normal compressibility and flow on color Doppler imaging. Femoral Vein: No evidence of thrombus. Normal compressibility, respiratory phasicity and response to augmentation. Popliteal Vein: No evidence of thrombus. Normal compressibility, respiratory phasicity and response to augmentation. Calf Veins: No evidence of thrombus. Normal compressibility and flow on color Doppler imaging. Superficial Great Saphenous Vein: No evidence of thrombus. Normal compressibility. Venous Reflux:  None. Other Findings: Subcutaneous edema is noted at the level of the left lower leg and calf. IMPRESSION: No evidence of DVT within the left lower extremity. Electronically Signed   By: Sandi Mariscal M.D.   On: 10/06/2018 11:41   Dg Foot Complete Left  Result Date: 10/06/2018 CLINICAL DATA:  Left foot pain and swelling.  Foot ulcer. EXAM: LEFT FOOT - COMPLETE 3+ VIEW COMPARISON:  None. FINDINGS: There is no evidence of fracture or dislocation. There is no evidence of arthropathy or other focal bone abnormality. No lytic destruction is seen to suggest osteomyelitis. Dorsal soft tissue swelling is noted. IMPRESSION: No fracture or dislocation is noted. No lytic destruction is seen to suggest osteomyelitis. Dorsal soft tissue swelling is noted suggesting cellulitis or other inflammation. Electronically Signed   By: Marijo Conception, M.D.   On: 10/06/2018 11:38    Assessment & Plan: Most part things is a superficial wound  but there is significant cellulitis.  His edema is not helping this at all.  I did use a 15 scalpel blade to clean off and scrape off some dry skin centrally on the wound and also took a culture of the area today which I will put orders in for.  Also wanted to get an OrthoWedge shoe to keep all the weight off his forefoot when he is utilizing it.  I will get physical therapy to work with him also.  Needs to keep all the weight off the forefoot when ambulating.  Will order a wet-to-dry dressing daily try to moisturize this area and promote further healing to the region.  White count is within normal limits at present I will check him tomorrow and see if he is doing better with it.  Active Problems:   Diabetic foot infection [facility])   Family Communication: Plan discussed with patient and family.  Albertine Patricia M.D on 10/06/2018 at 1:39 PM  Thank you for the consult, we will follow the patient with you in the Hospital.

## 2018-10-06 NOTE — ED Notes (Signed)
Patient transported to Ultrasound 

## 2018-10-06 NOTE — Telephone Encounter (Signed)
Pt in ED now.

## 2018-10-06 NOTE — Telephone Encounter (Signed)
Patient is being evaluated at ED right now. This message was not regarding diabetic issues. He has significant increased swelling in left leg from toe hip with some redness in the lower leg. Pt stated he has an open place on his left foot.

## 2018-10-06 NOTE — H&P (Signed)
[facility] [place] NAME: Gregory Crane    MR#:  [id number]  DATE OF BIRTH:  [birth year]  DATE OF ADMISSION:  10/06/2018  PRIMARY CARE PHYSICIAN: [name], MD   REQUESTING/REFERRING PHYSICIAN: Dr. Mariea Clonts  CHIEF COMPLAINT:  left foot redness and ulcer on the bottom of the foot 2 to 3 days  HISTORY OF PRESENT ILLNESS:  Gregory Crane  is a 48 y.o. male with a known history of two diabetes on insulin, hypertension, hypothyroidism, history of stroke comes to the emergency room accompanied by wife after he started noticing redness last night over the tibial shin on the left foot. Patient has scabbed/thick lesion over the door some of the left foot which he noticed two days ago. Denies any pain. Patient has peripheral neuropathy. White count is normal. Patient is receiving IV vancomycin.  Patient is good to be admitted for left foot diabetic infection. Secure chat message sent to Dr. Elvina Mattes for see patient  PAST MEDICAL HISTORY:   Past Medical History:  Diagnosis Date  . Allergy   . Bell's palsy   . Diabetes mellitus without complication (HCC)    diet controlled  . Hypertension   . Hypothyroidism   . Kidney stones   . Stroke [facility])     PAST SURGICAL HISTOIRY:   Past Surgical History:  Procedure Laterality Date  . LOOP RECORDER INSERTION N/A 01/27/2018   Procedure: LOOP RECORDER INSERTION;  Surgeon: [name], MD;  Location: [place];  Service: Cardiovascular;  Laterality: N/A;  . LUMBAR PUNCTURE     as child  . NO PAST SURGERIES    . TEE WITHOUT CARDIOVERSION N/A 01/07/2018   Procedure: TRANSESOPHAGEAL ECHOCARDIOGRAM (TEE);  Surgeon: Minna Merritts, MD;  Location: [facility];  Service: Cardiovascular;  Laterality: N/A;    SOCIAL HISTORY:   Social History   Tobacco Use  . Smoking status: Never Smoker  . Smokeless tobacco: Never Used  Substance Use Topics  . Alcohol use: No    Alcohol/week: 0.0  standard drinks    Comment: very rarely (1-2/year)    FAMILY HISTORY:   Family History  Problem Relation Age of Onset  . Breast cancer Mother   . Diabetes Father   . Diabetes Sister   . Arthritis Maternal Grandmother   . Diabetes Maternal Grandmother     DRUG ALLERGIES:  No Known Allergies  REVIEW OF SYSTEMS:  Review of Systems  Constitutional: Negative for chills, fever and weight loss.  HENT: Negative for ear discharge, ear pain and nosebleeds.   Eyes: Negative for blurred vision, pain and discharge.  Respiratory: Negative for sputum production, shortness of breath, wheezing and stridor.   Cardiovascular: Negative for chest pain, palpitations, orthopnea and PND.  Gastrointestinal: Negative for abdominal pain, diarrhea, nausea and vomiting.  Genitourinary: Negative for frequency and urgency.  Musculoskeletal: Negative for back pain and joint pain.  Skin: Positive for rash.  Neurological: Negative for sensory change, speech change, focal weakness and weakness.  Psychiatric/Behavioral: Negative for depression and hallucinations. The patient is not nervous/anxious.      MEDICATIONS AT HOME:   Prior to Admission medications   Medication Sig Start Date End Date Taking? Authorizing Provider  ASPIRIN LOW DOSE 81 MG EC tablet TAKE 1 TABLET(81 MG) BY MOUTH DAILY 07/28/18  Yes [name], MD  atorvastatin (LIPITOR) 80 MG tablet Take 80 mg by mouth Nightly.  07/15/18  Yes [provider]  clopidogrel (PLAVIX) 75  MG tablet TAKE 1 TABLET(75 MG) BY MOUTH DAILY 07/27/18  Yes [name], MD  DULoxetine (CYMBALTA) 20 MG capsule Take 20 mg by mouth daily.  01/09/18  Yes [provider]  HUMALOG 100 UNIT/ML injection INJECT 4 TO 6 UNITS UNDER THE SKIN THREE TIMES DAILY 08/18/18  Yes [name], MD  LANTUS 100 UNIT/ML injection INJECT 15 UNITS UNDER THE SKIN EVERY NIGHT AT BEDTIME 09/10/18  Yes [name], Randell Patient, MD  levothyroxine (SYNTHROID, LEVOTHROID) 112 MCG  tablet TAKE 1 TABLET(112 MCG) BY MOUTH DAILY BEFORE BREAKFAST 07/27/18  Yes [name], MD  losartan (COZAAR) 50 MG tablet TAKE 1 TABLET(50 MG) BY MOUTH DAILY 09/02/18  Yes [name], MD  metFORMIN (GLUCOPHAGE) 500 MG tablet TAKE 1 TABLET BY MOUTH DAILY WITH BREAKFAST 07/27/18  Yes [name], MD  pregabalin (LYRICA) 100 MG capsule Take 1 capsule (100 mg total) by mouth 3 (three) times daily. 10/05/18  Yes [name], MD  atorvastatin (LIPITOR) 80 MG tablet Take 1 tablet (80 mg total) by mouth daily. 01/22/18 04/22/18  [name], MD  glucose blood (FREESTYLE LITE) test strip Check blood sugars twice a day (Dx. 250.02) 04/18/17   [name], MD  INSULIN SYRINGE .5CC/29G 29G X 1/2" 0.5 ML MISC USE AS DIRECTED 04/02/18   [name], MD  Insulin Syringe-Needle U-100 (INSULIN SYRINGE .5CC/30GX5/16") 30G X 5/16" 0.5 ML MISC USE AS DIRECTED 07/01/18   [name], MD  Insulin Syringe-Needle U-100 27G X 1/2" 1 ML MISC Use as directed with insulin. One needle per use. 12/01/17   [name], MD  [facility] DELICA LANCETS FINE MISC 1 applicator by Other route 4 (four) times daily. 10/06/17   [provider]      VITAL SIGNS:  Blood pressure (!) 169/77, pulse 81, temperature 97.7 F (36.5 C), temperature source Oral, resp. rate 18, height 6' (1.829 m), weight 129.3 kg, SpO2 98 %.  PHYSICAL EXAMINATION:  GENERAL:  48 y.o.-year-old patient lying in the bed with no acute distress.  EYES: Pupils equal, round, reactive to light and accommodation. No scleral icterus. Extraocular muscles intact.  HEENT: Head atraumatic, normocephalic. Oropharynx and nasopharynx clear.  NECK:  Supple, no jugular venous distention. No thyroid enlargement, no tenderness.  LUNGS: Normal breath sounds bilaterally, no wheezing, rales,rhonchi or crepitation. No use of accessory muscles of respiration.  CARDIOVASCULAR: S1, S2 normal. No murmurs, rubs, or gallops.  ABDOMEN: Soft, nontender,  nondistended. Bowel sounds present. No organomegaly or mass.  EXTREMITIES: No pedal edema, cyanosis, or clubbing. Left tibial shin cellulitis with swelling. There is a small scabbed lesion noted on the lateral aspect of the door some of the left foot. No discharge noted. Skin marking done. NEUROLOGIC: Cranial nerves II through XII are intact. Muscle strength 5/5 in all extremities. Peripheral neuropathy present  gait not checked.  PSYCHIATRIC: The patient is alert and oriented x 3.  SKIN: No obvious rash, lesion, or ulcer.   LABORATORY PANEL:   CBC Recent Labs  Lab 10/06/18 0945  WBC 7.7  HGB 12.5*  HCT 37.2*  PLT 223   ------------------------------------------------------------------------------------------------------------------  Chemistries  Recent Labs  Lab 10/06/18 0945  NA 138  K 4.5  CL 106  CO2 28  GLUCOSE 193*  BUN 25*  CREATININE 1.29*  CALCIUM 8.6*  AST 15  ALT 13  ALKPHOS 75  BILITOT 0.7   ------------------------------------------------------------------------------------------------------------------  Cardiac Enzymes No results for input(s): TROPONINI in the last 168 hours. ------------------------------------------------------------------------------------------------------------------  RADIOLOGY:  US Venous Img Lower Unilateral  Left  Result Date: 10/06/2018 CLINICAL DATA:  Left lower extremity pain and edema for the past 3 days. Evaluate for DVT. EXAM: LEFT LOWER EXTREMITY VENOUS DOPPLER ULTRASOUND TECHNIQUE: Gray-scale sonography with graded compression, as well as color Doppler and duplex ultrasound were performed to evaluate the lower extremity deep venous systems from the level of the common femoral vein and including the common femoral, femoral, profunda femoral, popliteal and calf veins including the posterior tibial, peroneal and gastrocnemius veins when visible. The superficial great saphenous vein was also interrogated. Spectral Doppler was  utilized to evaluate flow at rest and with distal augmentation maneuvers in the common femoral, femoral and popliteal veins. COMPARISON:  None. FINDINGS: Contralateral Common Femoral Vein: Respiratory phasicity is normal and symmetric with the symptomatic side. No evidence of thrombus. Normal compressibility. Common Femoral Vein: No evidence of thrombus. Normal compressibility, respiratory phasicity and response to augmentation. Saphenofemoral Junction: No evidence of thrombus. Normal compressibility and flow on color Doppler imaging. Profunda Femoral Vein: No evidence of thrombus. Normal compressibility and flow on color Doppler imaging. Femoral Vein: No evidence of thrombus. Normal compressibility, respiratory phasicity and response to augmentation. Popliteal Vein: No evidence of thrombus. Normal compressibility, respiratory phasicity and response to augmentation. Calf Veins: No evidence of thrombus. Normal compressibility and flow on color Doppler imaging. Superficial Great Saphenous Vein: No evidence of thrombus. Normal compressibility. Venous Reflux:  None. Other Findings: Subcutaneous edema is noted at the level of the left lower leg and calf. IMPRESSION: No evidence of DVT within the left lower extremity. Electronically Signed   By: Sandi Mariscal M.D.   On: 10/06/2018 11:41   Dg Foot Complete Left  Result Date: 10/06/2018 CLINICAL DATA:  Left foot pain and swelling.  Foot ulcer. EXAM: LEFT FOOT - COMPLETE 3+ VIEW COMPARISON:  None. FINDINGS: There is no evidence of fracture or dislocation. There is no evidence of arthropathy or other focal bone abnormality. No lytic destruction is seen to suggest osteomyelitis. Dorsal soft tissue swelling is noted. IMPRESSION: No fracture or dislocation is noted. No lytic destruction is seen to suggest osteomyelitis. Dorsal soft tissue swelling is noted suggesting cellulitis or other inflammation. Electronically Signed   By: Marijo Conception, M.D.   On: 10/06/2018 11:38     EKG:    IMPRESSION AND PLAN:   Gregory Crane  is a 48 y.o. male with a known history of two diabetes on insulin, hypertension, hypothyroidism, history of stroke comes to the emergency room accompanied by wife after he started noticing redness last night over the tibial shin on the left foot. Patient has scabbed/thick lesion over the door some of the left foot which he noticed two days ago.  1. Left foot cellulitis/diabetic foot infection with small ulcer on the door some of the foot -admit to medical floor -IV unison -podiatry consultation with Dr. Elvina Mattes informed -foot x-ray negative for osteomyelitis shows soft tissue swelling -lower extremity Doppler negative for DVT  2. Uncontrolled diabetes -I increase the dose of Lantus 20 units along with sliding scale and continue metformin -dietitian consult for lifestyle and dietary changes  3. Hypertension continue home meds  4. Hypothyroidism continue Synthroid  5. DVT prophylaxis subcu Lovenox  Above was discussed with patient and wife    All the records are reviewed and case discussed with ED provider.   CODE STATUS: full  TOTAL TIME TAKING CARE OF THIS PATIENT: 50 minutes.    Fritzi Mandes M.D on 10/06/2018 at 12:01 PM  Between 7am to 6pm -  Pager - [phone]  After 6pm go to [email or web address] - password [email or web address] [facility]  [facility]  Office  [phone]  CC: Primary care physician; [name], MD

## 2018-10-06 NOTE — ED Notes (Signed)
Skin marked with marker for redness. Left Leg elevated.

## 2018-10-06 NOTE — Consult Note (Signed)
Pharmacy Antibiotic Note  Gregory Crane is a 48 y.o. male admitted on 10/06/2018 with cellulitis/diabetic foot infection.  Pharmacy has been consulted for Unasyn dosing.  Plan: Unasyn 3 gm IV every 6 hours  Height: 6' (182.9 cm) Weight: 285 lb (129.3 kg) IBW/kg (Calculated) : 77.6  Temp (24hrs), Avg:97.7 F (36.5 C), Min:97.7 F (36.5 C), Max:97.7 F (36.5 C)  Recent Labs  Lab 10/06/18 0945  WBC 7.7  CREATININE 1.29*    Estimated Creatinine Clearance: 97.4 mL/min (A) (by C-G formula based on SCr of 1.29 mg/dL (H)).    No Known Allergies  Antimicrobials this admission: Unasyn 2/11 >>  Vanco x 1 dose in ED  Dose adjustments this admission:  Microbiology results:  Thank you for allowing pharmacy to be a part of this patient's care.  Forrest Moron, PharmD Clinical Pharmacist 10/06/2018 12:08 PM

## 2018-10-06 NOTE — Telephone Encounter (Signed)
Pt with cut on bottom of left foot called to report also having swelling that extends above his left leg. Pt stated that there is redness and warmth 2 inches above left ankle. Pt having no other symptoms. Pt is a diabetic and has a h/o blood clots. Pt advised to go to the ED now for evaluation.   Reason for Disposition . SEVERE leg swelling (e.g., swelling extends above knee, entire leg is swollen, weeping fluid) . [1] Thigh, calf, or ankle swelling AND [2] only 1 side . [1] Has diabetes (diabetes mellitus) AND [2] minor cut or scratch on foot  Answer Assessment - Initial Assessment Questions 1. APPEARANCE of INJURY: "What does the injury look like?"      Red- shallow skin separated 2. SIZE: "How large is the cut?"      11/2 inches 3. BLEEDING: "Is it bleeding now?" If so, ask: "Is it difficult to stop?"      no 4. LOCATION: "Where is the injury located?"      Bottom of left foot 5. ONSET: "How long ago did the injury occur?"      Pt does not recall 6. MECHANISM: "Tell me how it happened."      Felt like there was a rock when walking and pt noted some skin that was hanging off and pt pulled it off and there was a cut 7. TETANUS: "When was the last tetanus booster?"     unknown 8. PREGNANCY: "Is there any chance you are pregnant?" "When was your last menstrual period?"     n/a  Answer Assessment - Initial Assessment Questions 1. ONSET: "When did the swelling start?" (e.g., minutes, hours, days)     2 nights ago 2. LOCATION: "What part of the leg is swollen?"  "Are both legs swollen or just one leg?"     Above knee down to top of foot swollen 3. SEVERITY: "How bad is the swelling?" (e.g., localized; mild, moderate, severe)  - Localized - small area of swelling localized to one leg  - MILD pedal edema - swelling limited to foot and ankle, pitting edema < 1/4 inch (6 mm) deep, rest and elevation eliminate most or all swelling  - MODERATE edema - swelling of lower leg to knee, pitting  edema > 1/4 inch (6 mm) deep, rest and elevation only partially reduce swelling  - SEVERE edema - swelling extends above knee, facial or hand swelling present      severe 4. REDNESS: "Does the swelling look red or infected?"    Redness 2 inches above ankle around leg and warmth 5. PAIN: "Is the swelling painful to touch?" If so, ask: "How painful is it?"   (Scale 1-10; mild, moderate or severe)     no 6. FEVER: "Do you have a fever?" If so, ask: "What is it, how was it measured, and when did it start?"      no 7. CAUSE: "What do you think is causing the leg swelling?"     Cut and/or blood clot 8. MEDICAL HISTORY: "Do you have a history of heart failure, kidney disease, liver failure, or cancer?"     no 9. RECURRENT SYMPTOM: "Have you had leg swelling before?" If so, ask: "When was the last time?" "What happened that time?"     Yes-late last year-wear compression socks 10. OTHER SYMPTOMS: "Do you have any other symptoms?" (e.g., chest pain, difficulty breathing)       no 11. PREGNANCY: "Is there any chance you  are pregnant?" "When was your last menstrual period?"       n/a  Protocols used: LEG SWELLING AND EDEMA-A-AH, CUTS AND LACERATIONS-A-AH

## 2018-10-06 NOTE — ED Triage Notes (Signed)
Pt here with c/o left lower leg swelling from ankle to hip that began 2 days ago, with warmth and redness towards the ankle. Walked to triage with no issues, NAD.

## 2018-10-06 NOTE — Evaluation (Signed)
Physical Therapy Evaluation Patient Details Name: Gregory Crane MRN: [id number] DOB: 02/25/1971 Today's Date: 10/06/2018   History of Present Illness  48 y/o male here with L foot ulcer, has LE neuropathy/pain, h/o CVAs.  Clinical Impression  Pt showed good effort with ambulation and stair negotiation and was able to maintain [place] safely and with good relative confidence using the walker.  He did have some UE fatigue with 150 ft, but overall did very well.  He was able to do steps with both rails and R only (simulating getting to bed room).  Pt showed good understanding, wife present and engaged with education regarding ADs, WBing, etc.  We did discuss crutches and/or knee scooter but given his neuropathy and h/o CVAs we decided that a walker is the best option.    Follow Up Recommendations Follow surgeon's recommendation for DC plan and follow-up therapies(will likely eventually need PT once cleared for [facility])    Equipment Recommendations  Rolling walker with 5" wheels    Recommendations for Other Services       Precautions / Restrictions Restrictions Weight Bearing Restrictions: Yes LLE Weight Bearing: Partial weight bearing Other Position/Activity Restrictions: with ortho-wedge and walker      Mobility  Bed Mobility Overal bed mobility: Independent                Transfers Overall transfer level: Modified independent Equipment used: Rolling walker (2 wheeled)             General transfer comment: Pt did need slight elevation of bed, able to rise w/o assist and maintaining PWBing  Ambulation/Gait Ambulation/Gait assistance: Supervision Gait Distance (Feet): 150 Feet Assistive device: Rolling walker (2 wheeled)       General Gait Details: Pt did well with ambualtion and showed ability to maintain PWBing with heavy UE use and orthowedge donned  Stairs Stairs: Yes Stairs assistance: Supervision Stair Management: Two rails;One rail Right Number of Stairs:  8 General stair comments: Pt easily mananged steps with b/l rails, also did well with single R rail but was slower, more guarded and limited but able to manage well  Wheelchair Mobility    Modified Rankin (Stroke Patients Only)       Balance Overall balance assessment: Modified Independent                                           Pertinent Vitals/Pain Pain Assessment: (chronic neuropathy pain, nothing new)    Home Living Family/patient expects to be discharged to:: Private residence Living Arrangements: Spouse/significant other Available Help at Discharge: Family;Available PRN/intermittently Type of Home: House Home Access: Stairs to enter Entrance Stairs-Rails: Can reach both Entrance Stairs-Number of Steps: 4 Home Layout: Two level Home Equipment: Cane - single point      Prior Function Level of Independence: Independent         Comments: independent without AD, works at a [employer] and is able to [employer]. Pt states that the only residual deficits left from previous strokes is decreased L hand dexterity that does not impede his everyday life.     Hand Dominance        Extremity/Trunk Assessment   Upper Extremity Assessment Upper Extremity Assessment: Overall WFL for tasks assessed    Lower Extremity Assessment Lower Extremity Assessment: Overall WFL for tasks assessed       Communication   Communication:  No difficulties  Cognition Arousal/Alertness: Awake/alert Behavior During Therapy: WFL for tasks assessed/performed Overall Cognitive Status: Within Functional Limits for tasks assessed                                        General Comments      Exercises     Assessment/Plan    PT Assessment Patient needs continued PT services  PT Problem List Decreased balance;Decreased mobility;Decreased activity tolerance;Decreased knowledge of use of DME;Decreased safety awareness;Pain       PT  Treatment Interventions Gait training;DME instruction;Stair training;Functional mobility training;Therapeutic activities;Therapeutic exercise;Balance training;Patient/family education;Neuromuscular re-education    PT Goals (Current goals can be found in the Care Plan section)  Acute Rehab PT Goals Patient Stated Goal: get back to job PT Goal Formulation: With patient Time For Goal Achievement: 10/20/18 Potential to Achieve Goals: Good    Frequency Min 2X/week   Barriers to discharge        Co-evaluation               AM-PAC PT "6 Clicks" Mobility  Outcome Measure Help needed turning from your back to your side while in a flat bed without using bedrails?: None Help needed moving from lying on your back to sitting on the side of a flat bed without using bedrails?: None Help needed moving to and from a bed to a chair (including a wheelchair)?: None Help needed standing up from a chair using your arms (e.g., wheelchair or bedside chair)?: None Help needed to walk in hospital room?: None Help needed climbing 3-5 steps with a railing? : None 6 Click Score: 24    End of Session Equipment Utilized During Treatment: Gait belt Activity Tolerance: Patient tolerated treatment well Patient left: in chair;with call bell/phone within reach;with family/visitor present   PT Visit Diagnosis: Muscle weakness (generalized) (M62.81);Difficulty in walking, not elsewhere classified (R26.2)    Time: 1645-1710 PT Time Calculation (min) (ACUTE ONLY): 25 min   Charges:   PT Evaluation $PT Eval Low Complexity: 1 Low          Kreg Shropshire, DPT 10/06/2018, 5:25 PM

## 2018-10-06 NOTE — ED Notes (Signed)
Pt ambulating to restroom. No needs at this time.

## 2018-10-07 LAB — GLUCOSE, CAPILLARY
Glucose-Capillary: 165 mg/dL — ABNORMAL HIGH (ref 70–99)
Glucose-Capillary: 204 mg/dL — ABNORMAL HIGH (ref 70–99)
Glucose-Capillary: 116 mg/dL — ABNORMAL HIGH (ref 70–99)
Glucose-Capillary: 202 mg/dL — ABNORMAL HIGH (ref 70–99)

## 2018-10-07 NOTE — Care Management Note (Signed)
Case Management Note  Patient Details  Name: Crane Crane MRN: [id number] Date of Birth: 02/24/1971  Subjective/Objective:                  Met with the patient with his wife on speaker phone The patient needs a RW and I notified [facility] for that need Patient feels he will not need HH PT, Provided the patient the HH choice list per CMS.gov in the event that it changes Has transportation with his wife Patient uses [facility] on [address] Can afford medications Dr. [name] is the PCP for the patient No further needs at this time Patient chancks BS at home and has all of the DM supplies that he needs  will be able to manage wound care between him and his wife   Action/Plan: HH list provided per CMS.gov Declines the need for HH  Expected Discharge Date:                  Expected Discharge Plan:     In-House Referral:     Discharge planning Services  CM Consult  Post Acute Care Choice:    Choice offered to:     DME Arranged:  Walker rolling DME Agency:  [facility]  HH Arranged:    HH Agency:     Status of Service:  In process, will continue to follow  If discussed at Long Length of Stay Meetings, dates discussed:    Additional Comments:  Crane J Gregory, RN 10/07/2018, 10:22 AM  

## 2018-10-07 NOTE — Progress Notes (Signed)
[facility]                                                      Patient Demographics  Gregory Crane, is a 48 y.o. male   MRN: [id number]   DOB - 1971-02-17  Admit Date - 10/06/2018    Outpatient Primary MD for the patient is [name], MD  Consult requested in the Hospital by Dustin Flock, MD, On 10/07/2018   With History of -  Past Medical History:  Diagnosis Date  . Allergy   . Bell's palsy   . Diabetes mellitus without complication (HCC)    diet controlled  . Hypertension   . Hypothyroidism   . Kidney stones   . Stroke [facility])       Past Surgical History:  Procedure Laterality Date  . LOOP RECORDER INSERTION N/A 01/27/2018   Procedure: LOOP RECORDER INSERTION;  Surgeon: [name], MD;  Location: [facility];  Service: Cardiovascular;  Laterality: N/A;  . LUMBAR PUNCTURE     as child  . NO PAST SURGERIES    . TEE WITHOUT CARDIOVERSION N/A 01/07/2018   Procedure: TRANSESOPHAGEAL ECHOCARDIOGRAM (TEE);  Surgeon: Minna Merritts, MD;  Location: [facility] ORS;  Service: Cardiovascular;  Laterality: N/A;    in for   Chief Complaint  Patient presents with  . Leg Swelling     HPI  Gregory Crane  is a 48 y.o. male, secondary hospitalized due to left lower leg cellulitis and diabetic ulceration on the plantar left foot.  Is been off of it he had physical therapy yesterday he is able to get up with a postop shoe on.  Specifically an OrthoWedge shoe.    Review of Systems    In addition to the HPI above,  No Fever-chills, No Headache, No changes with Vision or hearing, No problems swallowing food or Liquids, No Chest pain, Cough or Shortness of Breath, No Abdominal pain, No Nausea or Vommitting, Bowel movements are regular, No Blood in stool or Urine, No dysuria, No new skin rashes or bruises, No new joints pains-aches,  No  new weakness, tingling, numbness in any extremity, No recent weight gain or loss, No polyuria, polydypsia or polyphagia, No significant Mental Stressors.  A full 10 point Review of Systems was done, except as stated above, all other Review of Systems were negative.   Social History Social History   Tobacco Use  . Smoking status: Never Smoker  . Smokeless tobacco: Never Used  Substance Use Topics  . Alcohol use: No    Alcohol/week: 0.0 standard drinks    Comment: very rarely (1-2/year)    Family History Family History  Problem Relation Age of Onset  . Breast cancer Mother   . Diabetes Father   . Diabetes Sister   . Arthritis Maternal Grandmother   . Diabetes Maternal Grandmother     Prior to Admission medications   Medication Sig Start Date End Date Taking? Authorizing Provider  ASPIRIN LOW DOSE 81 MG EC tablet TAKE 1 TABLET(81 MG) BY MOUTH DAILY 07/28/18  Yes [name], MD  atorvastatin (LIPITOR) 80 MG tablet Take 80 mg by mouth Nightly.  07/15/18  Yes [provider]  clopidogrel (PLAVIX) 75 MG tablet TAKE 1 TABLET(75 MG) BY MOUTH DAILY 07/27/18  Yes [name], MD  DULoxetine (  CYMBALTA) 20 MG capsule Take 20 mg by mouth daily.  01/09/18  Yes [provider]  HUMALOG 100 UNIT/ML injection INJECT 4 TO 6 UNITS UNDER THE SKIN THREE TIMES DAILY 08/18/18  Yes [name], MD  LANTUS 100 UNIT/ML injection INJECT 15 UNITS UNDER THE SKIN EVERY NIGHT AT BEDTIME 09/10/18  Yes [name], Randell Patient, MD  levothyroxine (SYNTHROID, LEVOTHROID) 112 MCG tablet TAKE 1 TABLET(112 MCG) BY MOUTH DAILY BEFORE BREAKFAST 07/27/18  Yes [name], MD  losartan (COZAAR) 50 MG tablet TAKE 1 TABLET(50 MG) BY MOUTH DAILY 09/02/18  Yes [name], MD  metFORMIN (GLUCOPHAGE) 500 MG tablet TAKE 1 TABLET BY MOUTH DAILY WITH BREAKFAST 07/27/18  Yes [name], MD  pregabalin (LYRICA) 100 MG capsule Take 1 capsule (100 mg total) by mouth 3 (three) times daily. 10/05/18  Yes  [name], MD  atorvastatin (LIPITOR) 80 MG tablet Take 1 tablet (80 mg total) by mouth daily. 01/22/18 04/22/18  [name], MD  glucose blood (FREESTYLE LITE) test strip Check blood sugars twice a day (Dx. 250.02) 04/18/17   [name], MD  INSULIN SYRINGE .5CC/29G 29G X 1/2" 0.5 ML MISC USE AS DIRECTED 04/02/18   [name], MD  Insulin Syringe-Needle U-100 (INSULIN SYRINGE .5CC/30GX5/16") 30G X 5/16" 0.5 ML MISC USE AS DIRECTED 07/01/18   [name], MD  Insulin Syringe-Needle U-100 27G X 1/2" 1 ML MISC Use as directed with insulin. One needle per use. 12/01/17   [name], MD  [facility] DELICA LANCETS FINE MISC 1 applicator by Other route 4 (four) times daily. 10/06/17   [provider]    Anti-infectives (From admission, onward)   Start     Dose/Rate Route Frequency Ordered Stop   10/06/18 1400  Ampicillin-Sulbactam (UNASYN) 3 g in sodium chloride 0.9 % 100 mL IVPB  Status:  Discontinued     3 g 200 mL/hr over 30 Minutes Intravenous Every 6 hours 10/06/18 1318 10/07/18 0647   10/06/18 1400  Ampicillin-Sulbactam (UNASYN) 3 g in sodium chloride 0.9 % 100 mL IVPB     3 g 200 mL/hr over 30 Minutes Intravenous Every 6 hours 10/06/18 1211     10/06/18 1045  vancomycin (VANCOCIN) 2,000 mg in sodium chloride 0.9 % 500 mL IVPB     2,000 mg 250 mL/hr over 120 Minutes Intravenous  Once 10/06/18 1040 10/06/18 1507      Scheduled Meds: . aspirin EC  81 mg Oral Daily  . atorvastatin  80 mg Oral q1800  . clopidogrel  75 mg Oral Daily  . DULoxetine  20 mg Oral QHS  . enoxaparin (LOVENOX) injection  40 mg Subcutaneous Q24H  . insulin aspart  0-15 Units Subcutaneous TID WC  . insulin glargine  20 Units Subcutaneous QHS  . levothyroxine  112 mcg Oral Q0600  . losartan  50 mg Oral Daily  . metFORMIN  500 mg Oral Q breakfast  . pregabalin  100 mg Oral TID   Continuous Infusions: . ampicillin-sulbactam (UNASYN) IV 3 g (10/07/18 0802)   PRN Meds:.acetaminophen  **OR** acetaminophen, bisacodyl, ondansetron **OR** ondansetron (ZOFRAN) IV, polyethylene glycol  No Known Allergies  Physical Exam  Vitals  Blood pressure (!) 152/92, pulse 72, temperature 97.8 F (36.6 C), temperature source Oral, resp. rate 18, height 6' (1.829 m), weight 129.3 kg, SpO2 96 %.  Lower Extremity exam: Still has some cellulitis to the left ankle region and some to the left plantar foot and dorsal lateral left foot.  The wound is  still about the same size no evidence of drainage no evidence of deep penetration no evidence of sinus tracking to the region at this timeframe.  White count yesterday was 7.7 Data Review  CBC Recent Labs  Lab 10/06/18 0945  WBC 7.7  HGB 12.5*  HCT 37.2*  PLT 223  MCV 86.7  MCH 29.1  MCHC 33.6  RDW 11.9  LYMPHSABS 1.6  MONOABS 0.7  EOSABS 0.1  BASOSABS 0.0   ------------------------------------------------------------------------------------------------------------------  Chemistries  Recent Labs  Lab 10/06/18 0945  NA 138  K 4.5  CL 106  CO2 28  GLUCOSE 193*  BUN 25*  CREATININE 1.29*  CALCIUM 8.6*  AST 15  ALT 13  ALKPHOS 75  BILITOT 0.7   ------------------------------------------------------------------------------------------------------------------ estimated creatinine clearance is 97.4 mL/min (A) (by C-G formula based on SCr of 1.29 mg/dL (H)). ------------------------------------------------------------------------------------------------------------------ No results for input(s): TSH, T4TOTAL, T3FREE, THYROIDAB in the last 72 hours.  Invalid input(s): FREET3 Urinalysis    Component Value Date/Time   COLORURINE YELLOW (A) 09/12/2017 1722   APPEARANCEUR HAZY (A) 09/12/2017 1722   APPEARANCEUR Clear 01/27/2014 0247   LABSPEC 1.020 09/12/2017 1722   LABSPEC 1.033 01/27/2014 0247   PHURINE 5.0 09/12/2017 1722   GLUCOSEU >=500 (A) 09/12/2017 1722   GLUCOSEU >=500 01/27/2014 0247   HGBUR SMALL (A) 09/12/2017  1722   BILIRUBINUR NEGATIVE 09/12/2017 1722   BILIRUBINUR Negative 01/27/2014 0247   KETONESUR 20 (A) 09/12/2017 1722   PROTEINUR 100 (A) 09/12/2017 1722   NITRITE NEGATIVE 09/12/2017 1722   LEUKOCYTESUR NEGATIVE 09/12/2017 1722   LEUKOCYTESUR Negative 01/27/2014 0247     Imaging results:   US Venous Img Lower Unilateral Left  Result Date: 10/06/2018 CLINICAL DATA:  Left lower extremity pain and edema for the past 3 days. Evaluate for DVT. EXAM: LEFT LOWER EXTREMITY VENOUS DOPPLER ULTRASOUND TECHNIQUE: Gray-scale sonography with graded compression, as well as color Doppler and duplex ultrasound were performed to evaluate the lower extremity deep venous systems from the level of the common femoral vein and including the common femoral, femoral, profunda femoral, popliteal and calf veins including the posterior tibial, peroneal and gastrocnemius veins when visible. The superficial great saphenous vein was also interrogated. Spectral Doppler was utilized to evaluate flow at rest and with distal augmentation maneuvers in the common femoral, femoral and popliteal veins. COMPARISON:  None. FINDINGS: Contralateral Common Femoral Vein: Respiratory phasicity is normal and symmetric with the symptomatic side. No evidence of thrombus. Normal compressibility. Common Femoral Vein: No evidence of thrombus. Normal compressibility, respiratory phasicity and response to augmentation. Saphenofemoral Junction: No evidence of thrombus. Normal compressibility and flow on color Doppler imaging. Profunda Femoral Vein: No evidence of thrombus. Normal compressibility and flow on color Doppler imaging. Femoral Vein: No evidence of thrombus. Normal compressibility, respiratory phasicity and response to augmentation. Popliteal Vein: No evidence of thrombus. Normal compressibility, respiratory phasicity and response to augmentation. Calf Veins: No evidence of thrombus. Normal compressibility and flow on color Doppler imaging.  Superficial Great Saphenous Vein: No evidence of thrombus. Normal compressibility. Venous Reflux:  None. Other Findings: Subcutaneous edema is noted at the level of the left lower leg and calf. IMPRESSION: No evidence of DVT within the left lower extremity. Electronically Signed   By: Sandi Mariscal M.D.   On: 10/06/2018 11:41   Dg Foot Complete Left  Result Date: 10/06/2018 CLINICAL DATA:  Left foot pain and swelling.  Foot ulcer. EXAM: LEFT FOOT - COMPLETE 3+ VIEW COMPARISON:  None. FINDINGS: There is no evidence  of fracture or dislocation. There is no evidence of arthropathy or other focal bone abnormality. No lytic destruction is seen to suggest osteomyelitis. Dorsal soft tissue swelling is noted. IMPRESSION: No fracture or dislocation is noted. No lytic destruction is seen to suggest osteomyelitis. Dorsal soft tissue swelling is noted suggesting cellulitis or other inflammation. Electronically Signed   By: Marijo Conception, M.D.   On: 10/06/2018 11:38    Assessment & Plan: Patient wound is very stable at this point cellulitis is still present but that may get progressively better.  Has a low-grade swelling to the left leg were more pronounced in the right leg with 1+ pitting edema.  Culture was done yesterday with results on that.  Really not much drainage to get a culture also may come back negative.  May have contamination since it was a superficial wound culture.  A important thing right now is to keep him on antibiotics and make sure he does not continue to injure this left foot.  I put a heavy wet-to-dry dressing on the ulceration today.  He is to utilize his OrthoWedge shoe in his walker whenever he gets up to go to the bathroom.  Otherwise stay in bed with foot elevated try to reduce swelling to the region.  Also put a Ace wrap on the left foot and leg just below the knee try to control swelling better also.  I will check on him again tomorrow.  Active Problems:   Diabetic foot infection  [facility])   Family Communication: Plan discussed with patient  Albertine Patricia M.D on 10/07/2018 at 8:15 AM  Thank you for the consult, we will follow the patient with you in the Hospital.

## 2018-10-07 NOTE — Progress Notes (Signed)
[place] [facility]                                                                                                                                                                                  Patient Demographics   Gregory Crane, is a 48 y.o. male, DOB - 1970/09/09, KVQ:[id number]  Admit date - 10/06/2018   Admitting Physician Fritzi Mandes, MD  Outpatient Primary MD for the patient is [name], MD   LOS - 1  Subjective:  Patient continues to have left foot redness in the foot Denies any chest pain or shortness of breath   Review of Systems:   CONSTITUTIONAL: No documented fever. No fatigue, weakness. No weight gain, no weight loss.  EYES: No blurry or double vision.  ENT: No tinnitus. No postnasal drip. No redness of the oropharynx.  RESPIRATORY: No cough, no wheeze, no hemoptysis. No dyspnea.  CARDIOVASCULAR: No chest pain. No orthopnea. No palpitations. No syncope.  GASTROINTESTINAL: No nausea, no vomiting or diarrhea. No abdominal pain. No melena or hematochezia.  GENITOURINARY: No dysuria or hematuria.  ENDOCRINE: No polyuria or nocturia. No heat or cold intolerance.  HEMATOLOGY: No anemia. No bruising. No bleeding.  INTEGUMENTARY: Left foot redness of the leg MUSCULOSKELETAL: No arthritis. No swelling. No gout.  NEUROLOGIC: No numbness, tingling, or ataxia. No seizure-type activity.  PSYCHIATRIC: No anxiety. No insomnia. No ADD.    Vitals:   Vitals:   10/06/18 1206 10/06/18 1255 10/06/18 2319 10/07/18 0746  BP: (!) 158/73 (!) 157/75 (!) 162/81 (!) 152/92  Pulse: 75 79 78 72  Resp: 20 18 18 18   Temp:   98.6 F (37 C) 97.8 F (36.6 C)  TempSrc:   Oral Oral  SpO2: 96% 96% 96% 96%  Weight:      Height:        Wt Readings from Last 3 Encounters:  10/06/18 129.3 kg  07/27/18 130.2 kg  06/09/18 130.5 kg     Intake/Output Summary (Last 24 hours) at 10/07/2018 1316 Last data filed at 10/06/2018 1506 Gross per 24 hour  Intake  1840.18 ml  Output -  Net 1840.18 ml    Physical Exam:   GENERAL: Pleasant-appearing in no apparent distress.  HEAD, EYES, EARS, NOSE AND THROAT: Atraumatic, normocephalic. Extraocular muscles are intact. Pupils equal and reactive to light. Sclerae anicteric. No conjunctival injection. No oro-pharyngeal erythema.  NECK: Supple. There is no jugular venous distention. No bruits, no lymphadenopathy, no thyromegaly.  HEART: Regular rate and rhythm,. No murmurs, no rubs, no clicks.  LUNGS: Clear to auscultation bilaterally. No rales or rhonchi. No wheezes.  ABDOMEN: Soft, flat, nontender, nondistended. Has good bowel  sounds. No hepatosplenomegaly appreciated.  EXTREMITIES: Left leg dressing in place the redness under the leg is improved.  NEUROLOGIC: The patient is alert, awake, and oriented x3 with no focal motor or sensory deficits appreciated bilaterally.  SKIN: Moist and warm with no rashes appreciated.  Psych: Not anxious, depressed LN: No inguinal LN enlargement    Antibiotics   Anti-infectives (From admission, onward)   Start     Dose/Rate Route Frequency Ordered Stop   10/06/18 1400  Ampicillin-Sulbactam (UNASYN) 3 g in sodium chloride 0.9 % 100 mL IVPB  Status:  Discontinued     3 g 200 mL/hr over 30 Minutes Intravenous Every 6 hours 10/06/18 1318 10/07/18 0647   10/06/18 1400  Ampicillin-Sulbactam (UNASYN) 3 g in sodium chloride 0.9 % 100 mL IVPB     3 g 200 mL/hr over 30 Minutes Intravenous Every 6 hours 10/06/18 1211     10/06/18 1045  vancomycin (VANCOCIN) 2,000 mg in sodium chloride 0.9 % 500 mL IVPB     2,000 mg 250 mL/hr over 120 Minutes Intravenous  Once 10/06/18 1040 10/06/18 1507      Medications   Scheduled Meds: . aspirin EC  81 mg Oral Daily  . atorvastatin  80 mg Oral q1800  . clopidogrel  75 mg Oral Daily  . DULoxetine  20 mg Oral QHS  . enoxaparin (LOVENOX) injection  40 mg Subcutaneous Q24H  . insulin aspart  0-15 Units Subcutaneous TID WC  . insulin  glargine  20 Units Subcutaneous QHS  . levothyroxine  112 mcg Oral Q0600  . losartan  50 mg Oral Daily  . metFORMIN  500 mg Oral Q breakfast  . pregabalin  100 mg Oral TID   Continuous Infusions: . ampicillin-sulbactam (UNASYN) IV 3 g (10/07/18 0802)   PRN Meds:.acetaminophen **OR** acetaminophen, bisacodyl, ondansetron **OR** ondansetron (ZOFRAN) IV, polyethylene glycol   Data Review:   Micro Results Recent Results (from the past 240 hour(s))  Aerobic/Anaerobic Culture (surgical/deep wound)     Status: None (Preliminary result)   Collection Time: 10/06/18  3:17 PM  Result Value Ref Range Status   Specimen Description   Final    FOOT Performed at [facility], [address], [place], [place] [address]    Special Requests   Final    LEFT Performed at [facility], [address] [place], [place] [address]    Gram Stain   Final    RARE WBC PRESENT,BOTH PMN AND MONONUCLEAR FEW GRAM POSITIVE COCCI    Culture   Final    MODERATE STAPHYLOCOCCUS AUREUS SUSCEPTIBILITIES TO FOLLOW Performed at [facility], [place] [address], [place], [place] [address]    Report Status PENDING  Incomplete    Radiology Reports US Venous Img Lower Unilateral Left  Result Date: 10/06/2018 CLINICAL DATA:  Left lower extremity pain and edema for the past 3 days. Evaluate for DVT. EXAM: LEFT LOWER EXTREMITY VENOUS DOPPLER ULTRASOUND TECHNIQUE: Gray-scale sonography with graded compression, as well as color Doppler and duplex ultrasound were performed to evaluate the lower extremity deep venous systems from the level of the common femoral vein and including the common femoral, femoral, profunda femoral, popliteal and calf veins including the posterior tibial, peroneal and gastrocnemius veins when visible. The superficial great saphenous vein was also interrogated. Spectral Doppler was utilized to evaluate flow at rest and with distal augmentation maneuvers in the common femoral,  femoral and popliteal veins. COMPARISON:  None. FINDINGS: Contralateral Common Femoral Vein: Respiratory  phasicity is normal and symmetric with the symptomatic side. No evidence of thrombus. Normal compressibility. Common Femoral Vein: No evidence of thrombus. Normal compressibility, respiratory phasicity and response to augmentation. Saphenofemoral Junction: No evidence of thrombus. Normal compressibility and flow on color Doppler imaging. Profunda Femoral Vein: No evidence of thrombus. Normal compressibility and flow on color Doppler imaging. Femoral Vein: No evidence of thrombus. Normal compressibility, respiratory phasicity and response to augmentation. Popliteal Vein: No evidence of thrombus. Normal compressibility, respiratory phasicity and response to augmentation. Calf Veins: No evidence of thrombus. Normal compressibility and flow on color Doppler imaging. Superficial Great Saphenous Vein: No evidence of thrombus. Normal compressibility. Venous Reflux:  None. Other Findings: Subcutaneous edema is noted at the level of the left lower leg and calf. IMPRESSION: No evidence of DVT within the left lower extremity. Electronically Signed   By: Sandi Mariscal M.D.   On: 10/06/2018 11:41   Dg Foot Complete Left  Result Date: 10/06/2018 CLINICAL DATA:  Left foot pain and swelling.  Foot ulcer. EXAM: LEFT FOOT - COMPLETE 3+ VIEW COMPARISON:  None. FINDINGS: There is no evidence of fracture or dislocation. There is no evidence of arthropathy or other focal bone abnormality. No lytic destruction is seen to suggest osteomyelitis. Dorsal soft tissue swelling is noted. IMPRESSION: No fracture or dislocation is noted. No lytic destruction is seen to suggest osteomyelitis. Dorsal soft tissue swelling is noted suggesting cellulitis or other inflammation. Electronically Signed   By: Marijo Conception, M.D.   On: 10/06/2018 11:38     CBC Recent Labs  Lab 10/06/18 0945  WBC 7.7  HGB 12.5*  HCT 37.2*  PLT 223  MCV 86.7   MCH 29.1  MCHC 33.6  RDW 11.9  LYMPHSABS 1.6  MONOABS 0.7  EOSABS 0.1  BASOSABS 0.0    Chemistries  Recent Labs  Lab 10/06/18 0945  NA 138  K 4.5  CL 106  CO2 28  GLUCOSE 193*  BUN 25*  CREATININE 1.29*  CALCIUM 8.6*  AST 15  ALT 13  ALKPHOS 75  BILITOT 0.7   ------------------------------------------------------------------------------------------------------------------ estimated creatinine clearance is 97.4 mL/min (A) (by C-G formula based on SCr of 1.29 mg/dL (H)). ------------------------------------------------------------------------------------------------------------------ Recent Labs    10/06/18 0945  HGBA1C 8.0*   ------------------------------------------------------------------------------------------------------------------ No results for input(s): CHOL, HDL, LDLCALC, TRIG, CHOLHDL, LDLDIRECT in the last 72 hours. ------------------------------------------------------------------------------------------------------------------ No results for input(s): TSH, T4TOTAL, T3FREE, THYROIDAB in the last 72 hours.  Invalid input(s): FREET3 ------------------------------------------------------------------------------------------------------------------ No results for input(s): VITAMINB12, FOLATE, FERRITIN, TIBC, IRON, RETICCTPCT in the last 72 hours.  Coagulation profile No results for input(s): INR, PROTIME in the last 168 hours.  No results for input(s): DDIMER in the last 72 hours.  Cardiac Enzymes No results for input(s): CKMB, TROPONINI, MYOGLOBIN in the last 168 hours.  Invalid input(s): CK ------------------------------------------------------------------------------------------------------------------ Invalid input(s): Needles   Gregory Crane  is a 48 y.o. male with a known history of two diabetes on insulin, hypertension, hypothyroidism, history of stroke comes to the emergency room accompanied by wife after he started noticing  redness last night over the tibial shin on the left foot. Patient has scabbed/thick lesion over the door some of the left foot which he noticed two days ago.  1. Left foot cellulitis/diabetic foot infection with small ulcer on the door some of the foot Appreciate podiatry input Continue IV antibiotics  2. Uncontrolled diabetes Continue Lantus at current dose sugars are improved  3. Hypertension continue Cozaar  4. Hypothyroidism continue Synthroid  5. DVT prophylaxis subcu Lovenox      Code Status Orders  (From admission, onward)         Start     Ordered   10/06/18 1319  Full code  Continuous     10/06/18 1318        Code Status History    Date Active Date Inactive Code Status Order ID Comments User Context   04/10/2018 0034 04/10/2018 2208 Full Code [id number]  Amelia Jo, MD Inpatient   01/06/2018 0423 01/07/2018 1635 Full Code [id number]  Arta Silence, MD Inpatient   09/12/2017 2347 09/14/2017 1444 Full Code [id number]  Amelia Jo, MD ED   10/20/2016 2044 10/21/2016 1529 Full Code [id number]  Gladstone Lighter, MD Inpatient           Consults podiatry  DVT Prophylaxis  Lovenox  Lab Results  Component Value Date   PLT 223 10/06/2018     Time Spent in minutes   35 minutes  Greater than 50% of time spent in care coordination and counseling patient regarding the condition and plan of care.   Dustin Flock M.D on 10/07/2018 at 1:16 PM  Between 7am to 6pm - Pager - [phone]  After 6pm go to [email or web address] - Proofreader  [employer]   Office  [phone]

## 2018-10-08 LAB — BASIC METABOLIC PANEL WITH GFR
Anion gap: 3 — ABNORMAL LOW (ref 5–15)
BUN: 19 mg/dL (ref 6–20)
CO2: 29 mmol/L (ref 22–32)
Calcium: 8.3 mg/dL — ABNORMAL LOW (ref 8.9–10.3)
Chloride: 106 mmol/L (ref 98–111)
Creatinine, Ser: 1.17 mg/dL (ref 0.61–1.24)
GFR calc Af Amer: >60 mL/min (ref >60)
GFR calc non Af Amer: >60 mL/min (ref >60)
Glucose, Bld: 188 mg/dL — ABNORMAL HIGH (ref 70–99)
Potassium: 4.1 mmol/L (ref 3.5–5.1)
Sodium: 138 mmol/L (ref 135–145)

## 2018-10-08 LAB — CBC
HCT: 33.7 % — ABNORMAL LOW (ref 39.0–52.0)
Hemoglobin: 11.2 g/dL — ABNORMAL LOW (ref 13.0–17.0)
MCH: 28.5 pg (ref 26.0–34.0)
MCHC: 33.2 g/dL (ref 30.0–36.0)
MCV: 85.8 fL (ref 80.0–100.0)
Platelets: 221 10*3/uL (ref 150–400)
RBC: 3.93 MIL/uL — ABNORMAL LOW (ref 4.22–5.81)
RDW: 11.7 % (ref 11.5–15.5)
WBC: 5.7 10*3/uL (ref 4.0–10.5)
nRBC: 0 % (ref 0.0–0.2)

## 2018-10-08 LAB — GLUCOSE, CAPILLARY
Glucose-Capillary: 177 mg/dL — ABNORMAL HIGH (ref 70–99)
Glucose-Capillary: 157 mg/dL — ABNORMAL HIGH (ref 70–99)

## 2018-10-08 MED ORDER — CLINDAMYCIN PHOSPHATE 900 MG/50ML IV SOLN
900.0000 mg | Freq: Three times a day (TID) | INTRAVENOUS | Status: DC
  Administered 2018-10-08: 900 mg via INTRAVENOUS
  Filled 2018-10-08 (×4): qty 50

## 2018-10-08 MED ORDER — CLINDAMYCIN HCL 300 MG PO CAPS: 300.0000 mg | ORAL_CAPSULE | Freq: Three times a day (TID) | ORAL | 0 refills | Status: AC

## 2018-10-08 NOTE — Discharge Summary (Signed)
Gregory Crane at [facility], [place] y.o., DOB 1971/03/12, MRN [id number]. 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  Patient was seen in the emergency room and admitted for lower extremity cellulitis as well as possible osteomyelitis.  And by podiatry they stated that patient's wound looks stable and did not feel that he had not osteomyelitis. Pt doing better redness in leg much improved.             Consults  None  Significant Tests:  See full reports for all details     US Venous Img Lower Unilateral Left  Result Date: 10/06/2018 CLINICAL DATA:  Left lower extremity pain and edema for the past 3 days. Evaluate for DVT. EXAM: LEFT LOWER EXTREMITY VENOUS DOPPLER ULTRASOUND TECHNIQUE: Gray-scale sonography with graded compression, as well as color Doppler and duplex ultrasound were performed to evaluate the lower extremity deep venous systems from the level of the common femoral vein and including the common femoral, femoral, profunda femoral, popliteal and calf veins including the posterior tibial, peroneal and gastrocnemius veins when visible. The superficial great saphenous vein was also interrogated. Spectral  Doppler was utilized to evaluate flow at rest and with distal augmentation maneuvers in the common femoral, femoral and popliteal veins. COMPARISON:  None. FINDINGS: Contralateral Common Femoral Vein: Respiratory phasicity is normal and symmetric with the symptomatic side. No evidence of thrombus. Normal compressibility. Common Femoral Vein: No evidence of thrombus. Normal compressibility, respiratory phasicity and response to augmentation. Saphenofemoral Junction: No evidence of thrombus. Normal compressibility and flow on color Doppler imaging. Profunda Femoral Vein: No evidence of thrombus. Normal compressibility and flow on color Doppler imaging. Femoral Vein: No evidence of thrombus. Normal compressibility, respiratory phasicity and response to augmentation. Popliteal Vein: No evidence of thrombus. Normal compressibility, respiratory phasicity and response to augmentation. Calf Veins: No evidence of thrombus. Normal compressibility and flow on color Doppler imaging. Superficial Great Saphenous Vein: No evidence of thrombus. Normal compressibility. Venous Reflux:  None. Other Findings: Subcutaneous edema is noted at the level of the left lower leg and calf. IMPRESSION: No evidence of DVT within the left lower extremity. Electronically Signed   By: Sandi Mariscal M.D.   On: 10/06/2018 11:41   Dg Foot Complete Left  Result Date: 10/06/2018 CLINICAL DATA:  Left foot pain and swelling.  Foot ulcer. EXAM: LEFT FOOT - COMPLETE 3+ VIEW COMPARISON:  None. FINDINGS: There is no evidence of fracture or dislocation. There is no evidence of arthropathy or other focal bone abnormality. No lytic destruction is seen to suggest osteomyelitis. Dorsal soft tissue swelling is noted. IMPRESSION: No fracture or dislocation is noted. No lytic destruction is seen to suggest osteomyelitis. Dorsal soft tissue swelling is noted suggesting cellulitis or other inflammation. Electronically Signed   By: Marijo Conception, M.D.   On:  10/06/2018 11:38  Today   Subjective:   Gregory Crane  Pt doing better  Objective:   Blood pressure (!) 159/79, pulse 73, temperature 98.3 F (36.8 C), temperature source Oral, resp. rate 16, height 6' (1.829 m), weight 129.3 kg, SpO2 97 %.  .  Intake/Output Summary (Last 24 hours) at 10/08/2018 1309 Last data filed at 10/08/2018 1021 Gross per 24 hour  Intake 360 ml  Output -  Net 360 ml    Exam VITAL SIGNS: Blood pressure (!) 159/79, pulse 73, temperature 98.3 F (36.8 C), temperature source Oral, resp. rate 16, height 6' (1.829 m), weight 129.3 kg, SpO2 97 %.  GENERAL:  48 y.o.-year-old patient lying in the bed with no acute distress.  EYES: Pupils equal, round, reactive to light and accommodation. No scleral icterus. Extraocular muscles intact.  HEENT: Head atraumatic, normocephalic. Oropharynx and nasopharynx clear.  NECK:  Supple, no jugular venous distention. No thyroid enlargement, no tenderness.  LUNGS: Normal breath sounds bilaterally, no wheezing, rales,rhonchi or crepitation. No use of accessory muscles of respiration.  CARDIOVASCULAR: S1, S2 normal. No murmurs, rubs, or gallops.  ABDOMEN: Soft, nontender, nondistended. Bowel sounds present. No organomegaly or mass.  EXTREMITIES: Extremities the erythema is resolved. NEUROLOGIC: Cranial nerves II through XII are intact. Muscle strength 5/5 in all extremities. Sensation intact. Gait not checked.  PSYCHIATRIC: The patient is alert and oriented x 3.  SKIN: No obvious rash, lesion, or ulcer.   Data Review     CBC w Diff:  Lab Results  Component Value Date   WBC 5.7 10/08/2018   HGB 11.2 (L) 10/08/2018   HGB 16.3 03/25/2017   HCT 33.7 (L) 10/08/2018   HCT 47.8 03/25/2017   PLT 221 10/08/2018   PLT 213 03/25/2017   LYMPHOPCT 20 10/06/2018   LYMPHOPCT 25.4 01/26/2014   MONOPCT 9 10/06/2018   MONOPCT 5.9 01/26/2014   EOSPCT 2 10/06/2018   EOSPCT 0.8 01/26/2014   BASOPCT 0 10/06/2018   BASOPCT 0.3  01/26/2014   CMP:  Lab Results  Component Value Date   NA 138 10/08/2018   NA 141 03/25/2017   NA 136 01/26/2014   K 4.1 10/08/2018   K 4.0 01/26/2014   CL 106 10/08/2018   CL 102 01/26/2014   CO2 29 10/08/2018   CO2 27 01/26/2014   BUN 19 10/08/2018   BUN 17 03/25/2017   BUN 9 01/26/2014   CREATININE 1.17 10/08/2018   CREATININE 0.76 01/26/2014   PROT 7.0 10/06/2018   PROT 6.7 03/25/2017   ALBUMIN 3.0 (L) 10/06/2018   ALBUMIN 3.8 03/25/2017   BILITOT 0.7 10/06/2018   BILITOT 0.3 03/25/2017   ALKPHOS 75 10/06/2018   AST 15 10/06/2018   ALT 13 10/06/2018  .  Micro Results Recent Results (from the past 240 hour(s))  Aerobic/Anaerobic Culture (surgical/deep wound)     Status: None (Preliminary result)   Collection Time: 10/06/18  3:17 PM  Result Value Ref Range Status   Specimen Description   Final    FOOT Performed at [facility], [address], [place], [place] [address]    Special Requests   Final    LEFT Performed at [facility], [address] [place], [place] [address]    Gram Stain   Final    RARE WBC PRESENT,BOTH PMN AND MONONUCLEAR FEW GRAM POSITIVE COCCI Performed at [facility], [place] [address], [place], [place] [address]    Culture   Final    MODERATE METHICILLIN RESISTANT STAPHYLOCOCCUS  AUREUS NO ANAEROBES ISOLATED; CULTURE IN PROGRESS FOR 5 DAYS    Report Status PENDING  Incomplete   Organism ID, Bacteria METHICILLIN RESISTANT STAPHYLOCOCCUS AUREUS  Final      Susceptibility   Methicillin resistant staphylococcus aureus - MIC*    CIPROFLOXACIN <=0.5 SENSITIVE Sensitive     ERYTHROMYCIN >=8 RESISTANT Resistant     GENTAMICIN <=0.5 SENSITIVE Sensitive     OXACILLIN RESISTANT Resistant     TETRACYCLINE <=1 SENSITIVE Sensitive     VANCOMYCIN <=0.5 SENSITIVE Sensitive     TRIMETH/SULFA <=10 SENSITIVE Sensitive     CLINDAMYCIN <=0.25 SENSITIVE Sensitive     RIFAMPIN <=0.5 SENSITIVE Sensitive     Inducible Clindamycin  NEGATIVE Sensitive     * MODERATE METHICILLIN RESISTANT STAPHYLOCOCCUS AUREUS        Code Status Orders  (From admission, onward)         Start     Ordered   10/06/18 1319  Full code  Continuous     10/06/18 1318        Code Status History    Date Active Date Inactive Code Status Order ID Comments User Context   04/10/2018 0034 04/10/2018 2208 Full Code [id number]  Amelia Jo, MD Inpatient   01/06/2018 0423 01/07/2018 1635 Full Code [id number]  Arta Silence, MD Inpatient   09/12/2017 2347 09/14/2017 1444 Full Code [id number]  Amelia Jo, MD ED   10/20/2016 2044 10/21/2016 1529 Full Code [id number]  Gladstone Lighter, MD Inpatient          Follow-up Information    [name], MD Follow up in 6 day(s).   Specialty:  Internal Medicine Contact information: [address] [place] [address] [phone]        Albertine Patricia, DPM Follow up in 2 week(s).   Specialty:  Podiatry Contact information: [facility] [place] [address] [phone]           Discharge Medications   Allergies as of 10/08/2018   No Known Allergies     Medication List    TAKE these medications   ASPIRIN LOW DOSE 81 MG EC tablet Generic drug:  aspirin TAKE 1 TABLET(81 MG) BY MOUTH DAILY   atorvastatin 80 MG tablet Commonly known as:  LIPITOR Take 80 mg by mouth Nightly. What changed:  Another medication with the same name was removed. Continue taking this medication, and follow the directions you see here.   clindamycin 300 MG capsule Commonly known as:  CLEOCIN Take 1 capsule (300 mg total) by mouth 3 (three) times daily for 6 days.   clopidogrel 75 MG tablet Commonly known as:  PLAVIX TAKE 1 TABLET(75 MG) BY MOUTH DAILY   DULoxetine 20 MG capsule Commonly known as:  CYMBALTA Take 20 mg by mouth daily.   glucose blood test strip Commonly known as:  FREESTYLE LITE Check blood sugars twice a  day (Dx. 250.02)   HUMALOG 100 UNIT/ML injection Generic drug:  insulin lispro INJECT 4 TO 6 UNITS UNDER THE SKIN THREE TIMES DAILY   Insulin Syringe-Needle U-100 27G X 1/2" 1 ML Misc Use as directed with insulin. One needle per use.   INSULIN SYRINGE .5CC/29G 29G X 1/2" 0.5 ML Misc USE AS DIRECTED   INSULIN SYRINGE .5CC/30GX5/16" 30G X 5/16" 0.5 ML Misc USE AS DIRECTED   LANTUS 100 UNIT/ML injection Generic drug:  insulin glargine INJECT 15 UNITS UNDER THE SKIN EVERY NIGHT AT BEDTIME   levothyroxine 112 MCG tablet  Commonly known as:  SYNTHROID, LEVOTHROID TAKE 1 TABLET(112 MCG) BY MOUTH DAILY BEFORE BREAKFAST   losartan 50 MG tablet Commonly known as:  COZAAR TAKE 1 TABLET(50 MG) BY MOUTH DAILY   metFORMIN 500 MG tablet Commonly known as:  GLUCOPHAGE TAKE 1 TABLET BY MOUTH DAILY WITH BREAKFAST   ONETOUCH DELICA LANCETS FINE Misc 1 applicator by Other route 4 (four) times daily.   pregabalin 100 MG capsule Commonly known as:  LYRICA Take 1 capsule (100 mg total) by mouth 3 (three) times daily.          Total Time in preparing paper work, data evaluation and todays exam - 2 minutes  Dustin Flock M.D on 10/08/2018 at 1:09 [place]  [phone]

## 2018-10-08 NOTE — Progress Notes (Signed)
Physical Therapy Treatment Patient Details Name: Gregory Crane MRN: [id number] DOB: [birth year] Today's Date: 10/08/2018    History of Present Illness 48 y/o male here with L foot ulcer, has LE neuropathy/pain, h/o CVAs.    PT Comments    HEP demonstration and review.  Reviewed precautions and WB status with limiting ambulation and continue elevation to allow for proper healing.  Pt and wife voiced understanding of HEP and precautions.  Follow Up Recommendations  Follow surgeon's recommendation for DC plan and follow-up therapies     Equipment Recommendations  Rolling walker with 5" wheels    Recommendations for Other Services       Precautions / Restrictions Precautions Precaution Comments: bathroom privledges only Restrictions Weight Bearing Restrictions: Yes LLE Weight Bearing: Partial weight bearing Other Position/Activity Restrictions: with ortho-wedge and walker    Mobility  Bed Mobility                  Transfers                    Ambulation/Gait                 Stairs             Wheelchair Mobility    Modified Rankin (Stroke Patients Only)       Balance                                            Cognition Arousal/Alertness: Awake/alert Behavior During Therapy: WFL for tasks assessed/performed Overall Cognitive Status: Within Functional Limits for tasks assessed                                        Exercises Other Exercises Other Exercises: Reviewed and return demonstration of HEP for supine.  Verbal review of seated exercises.    General Comments        Pertinent Vitals/Pain Pain Assessment: No/denies pain    Home Living                      Prior Function            PT Goals (current goals can now be found in the care plan section) Progress towards PT goals: Progressing toward goals    Frequency    Min 2X/week      PT Plan Current plan remains  appropriate    Co-evaluation              AM-PAC PT "6 Clicks" Mobility   Outcome Measure  Help needed turning from your back to your side while in a flat bed without using bedrails?: None Help needed moving from lying on your back to sitting on the side of a flat bed without using bedrails?: None Help needed moving to and from a bed to a chair (including a wheelchair)?: None Help needed standing up from a chair using your arms (e.g., wheelchair or bedside chair)?: None Help needed to walk in hospital room?: None Help needed climbing 3-5 steps with a railing? : None 6 Click Score: 24    End of Session Equipment Utilized During Treatment: Gait belt Activity Tolerance: Patient tolerated treatment well Patient left: in bed;with call bell/phone within reach;with bed alarm set;with family/visitor present  Time: 1132-1140 PT Time Calculation (min) (ACUTE ONLY): 8 min  Charges:  $Therapeutic Exercise: 8-22 mins                     Chesley Noon, PTA 10/08/18, 11:46 AM

## 2018-10-08 NOTE — Progress Notes (Signed)
[place] at [facility] was admitted to the Hospital on 10/06/2018 and Discharged  10/08/2018   and should be excused from work/school  for 6 days starting 10/06/2018 , may   return to work/school without any restrictions.  Call Dustin Flock MD with questions.  Dustin Flock M.D on 10/08/2018,at 1:03 PM  [place] at [facility]  [phone]

## 2018-10-08 NOTE — Progress Notes (Signed)
[facility] Podiat[facility]                                                      Patient Demographics  Gregory Crane, is a 48 y.o. male   MRN: [id number]   DOB - 04-09-1971  Admit Date - 10/06/2018    Outpatient Primary MD for the patient is [name], MD  Consult requested in the Hospital by Dustin Flock, MD, On 10/08/2018   With History of -  Past Medical History:  Diagnosis Date  . Allergy   . Bell's palsy   . Diabetes mellitus without complication (HCC)    diet controlled  . Hypertension   . Hypothyroidism   . Kidney stones   . Stroke [facility])       Past Surgical History:  Procedure Laterality Date  . LOOP RECORDER INSERTION N/A 01/27/2018   Procedure: LOOP RECORDER INSERTION;  Surgeon: [name], MD;  Location: [place] [facility];  Service: Cardiovascular;  Laterality: N/A;  . LUMBAR PUNCTURE     as child  . NO PAST SURGERIES    . TEE WITHOUT CARDIOVERSION N/A 01/07/2018   Procedure: TRANSESOPHAGEAL ECHOCARDIOGRAM (TEE);  Surgeon: Minna Merritts, MD;  Location: [facility] ORS;  Service: Cardiovascular;  Laterality: N/A;    in for   Chief Complaint  Patient presents with  . Leg Swelling     HPI  Gregory Crane  is a 48 y.o. male, he is admitted 3 days ago to the hospital with cellulitis and diabetic ulcer to his left foot with cellulitis up to his ankle.  Been on vancomycin at improved quite a bit.  Cultures came back growing a likely community-acquired MRSA infection.  He has been mostly afebrile and has progressed and improved with this daily.    Review of Systems    In addition to the HPI above,  No Fever-chills, No Headache, No changes with Vision or hearing, No problems swallowing food or Liquids, No Chest pain, Cough or Shortness of Breath, No Abdominal pain, No Nausea or Vommitting, Bowel movements are regular, No Blood in stool or  Urine, No dysuria, No new skin rashes or bruises, No new joints pains-aches,  No new weakness, tingling, numbness in any extremity, No recent weight gain or loss, No polyuria, polydypsia or polyphagia, No significant Mental Stressors.  A full 10 point Review of Systems was done, except as stated above, all other Review of Systems were negative.   Social History Social History   Tobacco Use  . Smoking status: Never Smoker  . Smokeless tobacco: Never Used  Substance Use Topics  . Alcohol use: No    Alcohol/week: 0.0 standard drinks    Comment: very rarely (1-2/year)    Family History Family History  Problem Relation Age of Onset  . Breast cancer Mother   . Diabetes Father   . Diabetes Sister   . Arthritis Maternal Grandmother   . Diabetes Maternal Grandmother     Prior to Admission medications   Medication Sig Start Date End Date Taking? Authorizing Provider  ASPIRIN LOW DOSE 81 MG EC tablet TAKE 1 TABLET(81 MG) BY MOUTH DAILY 07/28/18  Yes [name], MD  atorvastatin (LIPITOR) 80 MG tablet Take 80 mg by mouth Nightly.  07/15/18  Yes [provider]  clopidogrel (PLAVIX) 75 MG tablet TAKE  1 TABLET(75 MG) BY MOUTH DAILY 07/27/18  Yes [name], MD  DULoxetine (CYMBALTA) 20 MG capsule Take 20 mg by mouth daily.  01/09/18  Yes [provider]  HUMALOG 100 UNIT/ML injection INJECT 4 TO 6 UNITS UNDER THE SKIN THREE TIMES DAILY 08/18/18  Yes [name], MD  LANTUS 100 UNIT/ML injection INJECT 15 UNITS UNDER THE SKIN EVERY NIGHT AT BEDTIME 09/10/18  Yes [name], Randell Patient, MD  levothyroxine (SYNTHROID, LEVOTHROID) 112 MCG tablet TAKE 1 TABLET(112 MCG) BY MOUTH DAILY BEFORE BREAKFAST 07/27/18  Yes [name], MD  losartan (COZAAR) 50 MG tablet TAKE 1 TABLET(50 MG) BY MOUTH DAILY 09/02/18  Yes [name], MD  metFORMIN (GLUCOPHAGE) 500 MG tablet TAKE 1 TABLET BY MOUTH DAILY WITH BREAKFAST 07/27/18  Yes [name], MD  pregabalin (LYRICA) 100 MG  capsule Take 1 capsule (100 mg total) by mouth 3 (three) times daily. 10/05/18  Yes [name], MD  atorvastatin (LIPITOR) 80 MG tablet Take 1 tablet (80 mg total) by mouth daily. 01/22/18 04/22/18  [name], MD  clindamycin (CLEOCIN) 300 MG capsule Take 1 capsule (300 mg total) by mouth 3 (three) times daily for 6 days. 10/08/18 10/14/18  Dustin Flock, MD  glucose blood (FREESTYLE LITE) test strip Check blood sugars twice a day (Dx. 250.02) 04/18/17   [name], MD  INSULIN SYRINGE .5CC/29G 29G X 1/2" 0.5 ML MISC USE AS DIRECTED 04/02/18   [name], MD  Insulin Syringe-Needle U-100 (INSULIN SYRINGE .5CC/30GX5/16") 30G X 5/16" 0.5 ML MISC USE AS DIRECTED 07/01/18   [name], MD  Insulin Syringe-Needle U-100 27G X 1/2" 1 ML MISC Use as directed with insulin. One needle per use. 12/01/17   [name], MD  [facility] DELICA LANCETS FINE MISC 1 applicator by Other route 4 (four) times daily. 10/06/17   [provider]    Anti-infectives (From admission, onward)   Start     Dose/Rate Route Frequency Ordered Stop   10/08/18 1400  clindamycin (CLEOCIN) IVPB 900 mg     900 mg 100 mL/hr over 30 Minutes Intravenous Every 8 hours 10/08/18 1123     10/08/18 0000  clindamycin (CLEOCIN) 300 MG capsule     300 mg Oral 3 times daily 10/08/18 1307 10/14/18 2359   10/06/18 1400  Ampicillin-Sulbactam (UNASYN) 3 g in sodium chloride 0.9 % 100 mL IVPB  Status:  Discontinued     3 g 200 mL/hr over 30 Minutes Intravenous Every 6 hours 10/06/18 1318 10/07/18 0647   10/06/18 1400  Ampicillin-Sulbactam (UNASYN) 3 g in sodium chloride 0.9 % 100 mL IVPB  Status:  Discontinued     3 g 200 mL/hr over 30 Minutes Intravenous Every 6 hours 10/06/18 1211 10/08/18 1123   10/06/18 1045  vancomycin (VANCOCIN) 2,000 mg in sodium chloride 0.9 % 500 mL IVPB     2,000 mg 250 mL/hr over 120 Minutes Intravenous  Once 10/06/18 1040 10/06/18 1507      Scheduled Meds: . aspirin EC  81 mg Oral  Daily  . atorvastatin  80 mg Oral q1800  . clopidogrel  75 mg Oral Daily  . DULoxetine  20 mg Oral QHS  . enoxaparin (LOVENOX) injection  40 mg Subcutaneous Q24H  . insulin aspart  0-15 Units Subcutaneous TID WC  . insulin glargine  20 Units Subcutaneous QHS  . levothyroxine  112 mcg Oral Q0600  . losartan  50 mg Oral Daily  . metFORMIN  500 mg Oral Q breakfast  .  pregabalin  100 mg Oral TID   Continuous Infusions: . clindamycin (CLEOCIN) IV 900 mg (10/08/18 1413)   PRN Meds:.acetaminophen **OR** acetaminophen, bisacodyl, ondansetron **OR** ondansetron (ZOFRAN) IV, polyethylene glycol  No Known Allergies  Physical Exam  Vitals  Blood pressure (!) 159/79, pulse 73, temperature 98.3 F (36.8 C), temperature source Oral, resp. rate 16, height 6' (1.829 m), weight 129.3 kg, SpO2 97 %.  Lower Extremity exam:  Vascular: Palpable bilateral  Dermatological: Ulceration sub-met 5 left foot is gotten smaller is more stable never did penetrate all the way into the subcutaneous tissue was mostly related to the skin.  Cellulitis on the foot and ankle has mostly resolved.  Neurological: Peripheral neuropathy.  Ortho: Pressure fifth metatarsal head left foot resulted in callus formation this ultimately ulcerated and developed the wound that he has now.  Wound has improved quite a bit.  Data Review  CBC Recent Labs  Lab 10/06/18 0945 10/08/18 0353  WBC 7.7 5.7  HGB 12.5* 11.2*  HCT 37.2* 33.7*  PLT 223 221  MCV 86.7 85.8  MCH 29.1 28.5  MCHC 33.6 33.2  RDW 11.9 11.7  LYMPHSABS 1.6  --   MONOABS 0.7  --   EOSABS 0.1  --   BASOSABS 0.0  --    ------------------------------------------------------------------------------------------------------------------  Chemistries  Recent Labs  Lab 10/06/18 0945 10/08/18 0353  NA 138 138  K 4.5 4.1  CL 106 106  CO2 28 29  GLUCOSE 193* 188*  BUN 25* 19  CREATININE 1.29* 1.17  CALCIUM 8.6* 8.3*  AST 15  --   ALT 13  --    ALKPHOS 75  --   BILITOT 0.7  --    Assessment & Plan: Patient is stable I think he is okay to be discharged at this point.  I redressed it with a heavy gauze dressing and he is to put a fresh dressing on daily with lots of gauze padding.  He is to utilize the forefoot relief shoe anytime he standing or ambulating.  I would recommend he stay out of work until we see him next week.  Recommend continuation of oral antibiotics.  Several choices were indicated on the sensitivities this included Cipro, doxycycline, and Bactrim.  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  Active Problems:   Diabetic foot infection [facility])   Family Communication: Plan discussed with patient and family.  Albertine Patricia M.D on 10/08/2018 at 2:22 PM  Thank you for the consult, we will follow the patient with you in the Hospital.

## 2018-10-12 LAB — AEROBIC/ANAEROBIC CULTURE W GRAM STAIN (SURGICAL/DEEP WOUND)

## 2018-10-13 ENCOUNTER — Other Ambulatory Visit: Payer: Self-pay | Admitting: Internal Medicine

## 2018-10-13 ENCOUNTER — Ambulatory Visit: Payer: Managed Care, Other (non HMO) | Admitting: Internal Medicine

## 2018-10-13 ENCOUNTER — Encounter: Payer: Self-pay | Admitting: Internal Medicine

## 2018-10-13 DIAGNOSIS — R400 Somnolence: Secondary | ICD-10-CM

## 2018-10-13 DIAGNOSIS — E785 Hyperlipidemia, unspecified: Secondary | ICD-10-CM

## 2018-10-13 DIAGNOSIS — E039 Hypothyroidism, unspecified: Secondary | ICD-10-CM

## 2018-10-13 DIAGNOSIS — Z8673 Personal history of transient ischemic attack (TIA), and cerebral infarction without residual deficits: Secondary | ICD-10-CM

## 2018-10-13 DIAGNOSIS — I10 Essential (primary) hypertension: Secondary | ICD-10-CM

## 2018-10-13 DIAGNOSIS — E11319 Type 2 diabetes mellitus with unspecified diabetic retinopathy without macular edema: Secondary | ICD-10-CM | POA: Diagnosis not present

## 2018-10-13 DIAGNOSIS — G629 Polyneuropathy, unspecified: Secondary | ICD-10-CM

## 2018-10-13 DIAGNOSIS — L089 Local infection of the skin and subcutaneous tissue, unspecified: Secondary | ICD-10-CM

## 2018-10-13 DIAGNOSIS — E11628 Type 2 diabetes mellitus with other skin complications: Secondary | ICD-10-CM

## 2018-10-13 DIAGNOSIS — R809 Proteinuria, unspecified: Secondary | ICD-10-CM

## 2018-10-13 MED ORDER — LOSARTAN POTASSIUM 100 MG PO TABS: 100.0000 mg | ORAL_TABLET | Freq: Every day | ORAL | 3 refills | Status: DC

## 2018-10-13 NOTE — ED Provider Notes (Signed)
[facility] Emergency Department Provider Note  ____________________________________________  Time seen: Approximately 8:17 PM  I have reviewed the triage vital signs and the nursing notes.   HISTORY  Chief Complaint Leg Swelling   Late Entry HPI Gregory Crane is a 48 y.o. male with a history of DM 2 presenting with left lower extremity erythema, swelling, pain and warmth.  The patient has noted this for the last couple of days and it has gotten progressively worse.  He has not had any systemic illness including fever, nausea or vomiting.  Past Medical History:  Diagnosis Date  . Allergy   . Bell's palsy   . Diabetes mellitus without complication (HCC)    diet controlled  . Hypertension   . Hypothyroidism   . Kidney stones   . Stroke [facility]     Patient Active Problem List   Diagnosis Date Noted  . Diabetic foot infection ([place]) 10/06/2018  . Facial droop 04/09/2018  . Daytime somnolence 03/30/2018  . Carotid stenosis 02/03/2018  . Acute CVA (cerebrovascular accident) ([place]) 01/06/2018  . Intracranial vascular stenosis 09/12/2017  . Cough 01/20/2017  . Bell's palsy 11/10/2016  . History of CVA (cerebrovascular accident) 11/10/2016  . 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 History of nephrolithiasis 10/27/2016  . TIA (transient ischemic attack) 10/20/2016  . Near syncope 06/23/2016  . 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 Neuropathy 08/06/2015  . Health care maintenance 08/06/2015  . Essential hypertension 08/06/2015  . Heme positive stool 10/10/2013  . Hypothyroidism 10/10/2013  . Microalbuminuria 10/10/2013  . Hyperlipidemia 10/10/2013  . B12 deficiency 10/10/2013  . Diabetes ([place]) 07/04/2013  . Environmental allergies 07/04/2013    Past Surgical History:  Procedure Laterality Date  . LOOP RECORDER INSERTION N/A 01/27/2018   Procedure: LOOP RECORDER INSERTION;  Surgeon: [name], MD;  Location:  [place] [facility];  Service: Cardiovascular;  Laterality: N/A;  . LUMBAR PUNCTURE     as child  . NO PAST SURGERIES    . TEE WITHOUT CARDIOVERSION N/A 01/07/2018   Procedure: TRANSESOPHAGEAL ECHOCARDIOGRAM (TEE);  Surgeon: Minna Merritts, MD;  Location: [facility] ORS;  Service: Cardiovascular;  Laterality: N/A;    Current Outpatient Rx  . Order #: [id number] Class: Normal  . Order #: [id number] Class: Historical Med  . Order #: [id number] Class: Normal  . Order #: [id number] Class: Historical Med  . Order #: [id number] Class: Normal  . Order #: [id number] Class: Normal  . Order #: [id number] Class: Normal  . Order #: [id number] Class: Normal  . Order #: [id number] Class: Normal  . Order #: [id number] Class: Normal  . Order #: [id number] Class: Print  . Order #: [id number] Class: Normal  . Order #: [id number] Class: Normal  . Order #: [id number] Class: Normal  . Order #: [id number] Class: Normal  . Order #: [id number] Class: Historical Med    Allergies Patient has no known allergies.  Family History  Problem Relation Age of Onset  . Breast cancer Mother   . Diabetes Father   . Diabetes Sister   . Arthritis Maternal Grandmother   . Diabetes Maternal Grandmother     Social History Social History   Tobacco Use  . Smoking status: Never Smoker  . Smokeless tobacco: Never Used  Substance Use Topics  . Alcohol use: No    Alcohol/week: 0.0 standard drinks    Comment: very rarely (1-2/year)  . Drug use: No    Review of Systems Constitutional: No fever/chills. Eyes: No visual changes. ENT: No sore throat. No congestion or rhinorrhea.  Cardiovascular: Denies chest pain. Denies palpitations. Respiratory: Denies shortness of breath.  No cough. Gastrointestinal: No abdominal pain.  No nausea, no vomiting.  No diarrhea.  No constipation. Genitourinary: Negative for dysuria. Musculoskeletal: Negative for back pain. Skin: Positive for rash. Neurological: Negative for headaches. No focal numbness, tingling  or weakness.     ____________________________________________   PHYSICAL EXAM:  VITAL SIGNS: ED Triage Vitals  Enc Vitals Group     BP 10/06/18 0946 (!) 169/77     Pulse Rate 10/06/18 0946 81     Resp 10/06/18 0946 18     Temp 10/06/18 0946 97.7 F (36.5 C)     Temp Source 10/06/18 0946 Oral     SpO2 10/06/18 0946 98 %     Weight 10/06/18 0941 285 lb (129.3 kg)     Height 10/06/18 0941 6' (1.829 m)     Head Circumference --      Peak Flow --      Pain Score 10/06/18 0940 2     Pain Loc --      Pain Edu? --      Excl. in [place]? --     Constitutional: Alert and oriented. Answers questions appropriately. Eyes: Conjunctivae are normal.  EOMI. No scleral icterus. Head: Atraumatic. Nose: No congestion/rhinnorhea. Mouth/Throat: Mucous membranes are moist.  Neck: No stridor.  Supple.   Cardiovascular: Normal rate, regular rhythm. No murmurs, rubs or gallops.  Respiratory: Normal respiratory effort.  No accessory muscle use or retractions. Lungs CTAB.  No wheezes, rales or ronchi. Musculoskeletal: Erythema over the distal left lower extremity from the foot to the mid tibial shaft.  There is a scabbed area on the lateral aspect of the entire aspect of the foot without any significant purulent discharge; approximately 3 cm and linear.  Normal DP and PT pulses.  No ttp in the calves or palpable cords.  Negative Homan's sign. Neurologic:  A&Ox3.  Speech is clear.  Face and smile are symmetric.  EOMI.  Moves all extremities well. Skin:  Skin is warm, dry and intact. No rash noted. Psychiatric: Mood and affect are normal. Speech and behavior are normal.  Normal judgement  ____________________________________________   LABS (all labs ordered are listed, but only abnormal results are displayed)  Labs Reviewed  COMPREHENSIVE METABOLIC PANEL - Abnormal; Notable for the following components:      Result Value   Glucose, Bld 193 (*)    BUN 25 (*)    Creatinine, Ser 1.29 (*)    Calcium  8.6 (*)    Albumin 3.0 (*)    Anion gap 4 (*)    All other components within normal limits  CBC WITH DIFFERENTIAL/PLATELET - Abnormal; Notable for the following components:   Hemoglobin 12.5 (*)    HCT 37.2 (*)    All other components within normal limits  SEDIMENTATION RATE - Abnormal; Notable for the following components:   Sed Rate 64 (*)    All other components within normal limits  HEMOGLOBIN A1C - Abnormal; Notable for the following components:   Hgb A1c MFr Bld 8.0 (*)    All other components within normal limits  GLUCOSE, CAPILLARY - Abnormal; Notable for the following components:   Glucose-Capillary 151 (*)    All other components within normal limits  GLUCOSE, CAPILLARY - Abnormal; Notable for the following components:   Glucose-Capillary 213 (*)    All other components within normal limits  GLUCOSE, CAPILLARY - Abnormal; Notable for the following components:  Glucose-Capillary 165 (*)    All other components within normal limits  GLUCOSE, CAPILLARY - Abnormal; Notable for the following components:   Glucose-Capillary 204 (*)    All other components within normal limits  GLUCOSE, CAPILLARY - Abnormal; Notable for the following components:   Glucose-Capillary 116 (*)    All other components within normal limits  CBC - Abnormal; Notable for the following components:   RBC 3.93 (*)    Hemoglobin 11.2 (*)    HCT 33.7 (*)    All other components within normal limits  BASIC METABOLIC PANEL - Abnormal; Notable for the following components:   Glucose, Bld 188 (*)    Calcium 8.3 (*)    Anion gap 3 (*)    All other components within normal limits  GLUCOSE, CAPILLARY - Abnormal; Notable for the following components:   Glucose-Capillary 202 (*)    All other components within normal limits  GLUCOSE, CAPILLARY - Abnormal; Notable for the following components:   Glucose-Capillary 177 (*)    All other components within normal limits  GLUCOSE, CAPILLARY - Abnormal; Notable for  the following components:   Glucose-Capillary 157 (*)    All other components within normal limits  AEROBIC/ANAEROBIC CULTURE (SURGICAL/DEEP WOUND)   ____________________________________________  EKG  Not indicated ____________________________________________  RADIOLOGY  No results found.  ____________________________________________   PROCEDURES  Procedure(s) performed: None  Procedures  Critical Care performed: No ____________________________________________   INITIAL IMPRESSION / ASSESSMENT AND PLAN / ED COURSE  Pertinent labs & imaging results that were available during my care of the patient were reviewed by me and considered in my medical decision making (see chart for details).  48 y.o. male with a history of diabetes presenting with cellulitis of the left foot and an open wound, diabetic foot ulcer.  Overall, the patient is hemodynamically stable but I am concerned about his risk given this progressive erythema and will plan admission for IV antibiotics and reevaluation.  The patient also has some mild renal insufficiency and has been treated with intravenous fluids.  His hyperglycemia is also being treated.  The patient underwent ultrasonography with no evidence of DVT.  I admitted the patient to the hospitalist for continued evaluation and treatment.  ____________________________________________  FINAL CLINICAL IMPRESSION(S) / ED DIAGNOSES  Final diagnoses:  Diabetic ulcer of left midfoot associated with type 2 diabetes mellitus, limited to breakdown of skin ([place])  Cellulitis of foot  Acute renal insufficiency         NEW MEDICATIONS STARTED DURING THIS VISIT:  Discharge Medication List as of 10/08/2018  3:27 PM    START taking these medications   Details  clindamycin (CLEOCIN) 300 MG capsule Take 1 capsule (300 mg total) by mouth 3 (three) times daily for 6 days., Starting Thu 10/08/2018, Until Wed 10/14/2018, Normal          Eula Listen, MD 10/13/18 2020

## 2018-10-13 NOTE — Patient Instructions (Signed)
Examples of probiotics:  Culturelle, florastor or align.

## 2018-10-13 NOTE — Progress Notes (Signed)
Patient ID: Gregory Crane, male   DOB: [birth year], 48 y.o.   MRN: [id number]   Subjective:    Patient ID: Gregory Crane, male    DOB: May 12, 1971, 48 y.o.   MRN: [id number]  HPI  Patient here for a hospital follow up.  Was admitted 10/06/18 with foot infection - diagnosed with cellulitis.  Open foot lesion on left foot.  Evaluated by podiatry.  Treated with abx.  Discharged on oral clindamycin.  Doing better. Swelling has improved. No redness.  Wearing a boot to keep pressure off of the area on his foot.  No fever.  Eating and drinking.  Brought in no sugar readings.  Not checking regularly.  States blood sugar was 169 this am.  No nausea or vomiting.  No diarrhea.  Discussed probiotics. Blood pressure has been elevated.  Has f/u with Dr Cleda Mccreedy this week.     Past Medical History:  Diagnosis Date  . Allergy   . Bell's palsy   . Diabetes mellitus without complication (HCC)    diet controlled  . Hypertension   . Hypothyroidism   . Kidney stones   . Stroke [facility])    Past Surgical History:  Procedure Laterality Date  . LOOP RECORDER INSERTION N/A 01/27/2018   Procedure: LOOP RECORDER INSERTION;  Surgeon: [name], MD;  Location: [place] [facility];  Service: Cardiovascular;  Laterality: N/A;  . LUMBAR PUNCTURE     as child  . NO PAST SURGERIES    . TEE WITHOUT CARDIOVERSION N/A 01/07/2018   Procedure: TRANSESOPHAGEAL ECHOCARDIOGRAM (TEE);  Surgeon: Minna Merritts, MD;  Location: [facility] ORS;  Service: Cardiovascular;  Laterality: N/A;   Family History  Problem Relation Age of Onset  . Breast cancer Mother   . Diabetes Father   . Diabetes Sister   . Arthritis Maternal Grandmother   . Diabetes Maternal Grandmother    Social History   Socioeconomic History  . Marital status: Married    Spouse name: [name]  . Number of children: 2  . Years of education: Not on file  . Highest education level: Not on file  Occupational History  . Not on file  Social Needs  . Financial  resource strain: Not hard at all  . Food insecurity:    Worry: Never true    Inability: Never true  . Transportation needs:    Medical: No    Non-medical: No  Tobacco Use  . Smoking status: Never Smoker  . Smokeless tobacco: Never Used  Substance and Sexual Activity  . Alcohol use: No    Alcohol/week: 0.0 standard drinks    Comment: very rarely (1-2/year)  . Drug use: No  . Sexual activity: Yes  Lifestyle  . Physical activity:    Days per week: 0 days    Minutes per session: 0 min  . Stress: To some extent  Relationships  . Social connections:    Talks on phone: More than three times a week    Gets together: More than three times a week    Attends religious service: More than 4 times per year    Active member of club or organization: No    Attends meetings of clubs or organizations: Never    Relationship status: Married  Other Topics Concern  . Not on file  Social History Narrative   Lives at home with wife, independent at baseline.    Outpatient Encounter Medications as of 10/13/2018  Medication Sig  . ASPIRIN LOW DOSE  81 MG EC tablet TAKE 1 TABLET(81 MG) BY MOUTH DAILY  . atorvastatin (LIPITOR) 80 MG tablet Take 80 mg by mouth Nightly.   . [EXPIRED] clindamycin (CLEOCIN) 300 MG capsule Take 1 capsule (300 mg total) by mouth 3 (three) times daily for 6 days.  . clopidogrel (PLAVIX) 75 MG tablet TAKE 1 TABLET(75 MG) BY MOUTH DAILY  . DULoxetine (CYMBALTA) 20 MG capsule Take 20 mg by mouth daily.   Marland Kitchen glucose blood (FREESTYLE LITE) test strip Check blood sugars twice a day (Dx. 250.02)  . HUMALOG 100 UNIT/ML injection INJECT 4 TO 6 UNITS UNDER THE SKIN THREE TIMES DAILY  . INSULIN SYRINGE .5CC/29G 29G X 1/2" 0.5 ML MISC USE AS DIRECTED  . Insulin Syringe-Needle U-100 27G X 1/2" 1 ML MISC Use as directed with insulin. One needle per use.  Marland Kitchen LANTUS 100 UNIT/ML injection INJECT 15 UNITS UNDER THE SKIN EVERY NIGHT AT BEDTIME  . levothyroxine (SYNTHROID, LEVOTHROID) 112 MCG  tablet TAKE 1 TABLET(112 MCG) BY MOUTH DAILY BEFORE BREAKFAST  . metFORMIN (GLUCOPHAGE) 500 MG tablet TAKE 1 TABLET BY MOUTH DAILY WITH BREAKFAST  . ONETOUCH DELICA LANCETS FINE MISC 1 applicator by Other route 4 (four) times daily.  . pregabalin (LYRICA) 100 MG capsule Take 1 capsule (100 mg total) by mouth 3 (three) times daily.  . [DISCONTINUED] Insulin Syringe-Needle U-100 (INSULIN SYRINGE .5CC/30GX5/16") 30G X 5/16" 0.5 ML MISC USE AS DIRECTED  . [DISCONTINUED] losartan (COZAAR) 50 MG tablet TAKE 1 TABLET(50 MG) BY MOUTH DAILY  . losartan (COZAAR) 100 MG tablet Take 1 tablet (100 mg total) by mouth daily.   No facility-administered encounter medications on file as of 10/13/2018.     Review of Systems  Constitutional: Negative for appetite change and fever.  Respiratory: Negative for cough, chest tightness and shortness of breath.   Cardiovascular: Negative for chest pain and palpitations.       Leg swelling improved.    Gastrointestinal: Negative for abdominal pain, diarrhea, nausea and vomiting.  Genitourinary: Negative for difficulty urinating and dysuria.  Musculoskeletal: Negative for myalgias.       No significant pain in his foot or leg.  Decreased swelling.   Skin: Negative for color change and rash.  Neurological: Negative for dizziness, light-headedness and headaches.  Psychiatric/Behavioral: Negative for agitation and dysphoric mood.       Objective:    Physical Exam Constitutional:      General: He is not in acute distress.    Appearance: Normal appearance. He is well-developed.  HENT:     Nose: Nose normal. No congestion.     Mouth/Throat:     Pharynx: No oropharyngeal exudate or posterior oropharyngeal erythema.  Cardiovascular:     Rate and Rhythm: Normal rate and regular rhythm.  Pulmonary:     Effort: Pulmonary effort is normal. No respiratory distress.     Breath sounds: Normal breath sounds.  Abdominal:     General: Bowel sounds are normal.      Palpations: Abdomen is soft.     Tenderness: There is no abdominal tenderness.  Musculoskeletal:        General: No swelling or tenderness.  Skin:    Findings: No erythema or rash.     Comments: Small open (healing) lesion plantar surface of left foot.    Neurological:     Mental Status: He is alert.  Psychiatric:        Behavior: Behavior normal.     BP 130/80   Pulse 77  Temp 97.9 F (36.6 C) (Oral)   Wt 290 lb 9.6 oz (131.8 kg)   SpO2 98%   BMI 39.41 kg/m  Wt Readings from Last 3 Encounters:  10/13/18 290 lb 9.6 oz (131.8 kg)  10/06/18 285 lb (129.3 kg)  07/27/18 287 lb (130.2 kg)     Lab Results  Component Value Date   WBC 5.7 10/08/2018   HGB 11.2 (L) 10/08/2018   HCT 33.7 (L) 10/08/2018   PLT 221 10/08/2018   GLUCOSE 188 (H) 10/08/2018   CHOL 103 04/10/2018   TRIG 113 04/10/2018   HDL 29 (L) 04/10/2018   LDLCALC 51 04/10/2018   ALT 13 10/06/2018   AST 15 10/06/2018   NA 138 10/08/2018   K 4.1 10/08/2018   CL 106 10/08/2018   CREATININE 1.17 10/08/2018   BUN 19 10/08/2018   CO2 29 10/08/2018   TSH 7.970 (H) 03/25/2017   INR 0.85 04/09/2018   HGBA1C 8.0 (H) 10/06/2018    US Venous Img Lower Unilateral Left  Result Date: 10/06/2018 CLINICAL DATA:  Left lower extremity pain and edema for the past 3 days. Evaluate for DVT. EXAM: LEFT LOWER EXTREMITY VENOUS DOPPLER ULTRASOUND TECHNIQUE: Gray-scale sonography with graded compression, as well as color Doppler and duplex ultrasound were performed to evaluate the lower extremity deep venous systems from the level of the common femoral vein and including the common femoral, femoral, profunda femoral, popliteal and calf veins including the posterior tibial, peroneal and gastrocnemius veins when visible. The superficial great saphenous vein was also interrogated. Spectral Doppler was utilized to evaluate flow at rest and with distal augmentation maneuvers in the common femoral, femoral and popliteal veins. COMPARISON:   None. FINDINGS: Contralateral Common Femoral Vein: Respiratory phasicity is normal and symmetric with the symptomatic side. No evidence of thrombus. Normal compressibility. Common Femoral Vein: No evidence of thrombus. Normal compressibility, respiratory phasicity and response to augmentation. Saphenofemoral Junction: No evidence of thrombus. Normal compressibility and flow on color Doppler imaging. Profunda Femoral Vein: No evidence of thrombus. Normal compressibility and flow on color Doppler imaging. Femoral Vein: No evidence of thrombus. Normal compressibility, respiratory phasicity and response to augmentation. Popliteal Vein: No evidence of thrombus. Normal compressibility, respiratory phasicity and response to augmentation. Calf Veins: No evidence of thrombus. Normal compressibility and flow on color Doppler imaging. Superficial Great Saphenous Vein: No evidence of thrombus. Normal compressibility. Venous Reflux:  None. Other Findings: Subcutaneous edema is noted at the level of the left lower leg and calf. IMPRESSION: No evidence of DVT within the left lower extremity. Electronically Signed   By: Sandi Mariscal M.D.   On: 10/06/2018 11:41   Dg Foot Complete Left  Result Date: 10/06/2018 CLINICAL DATA:  Left foot pain and swelling.  Foot ulcer. EXAM: LEFT FOOT - COMPLETE 3+ VIEW COMPARISON:  None. FINDINGS: There is no evidence of fracture or dislocation. There is no evidence of arthropathy or other focal bone abnormality. No lytic destruction is seen to suggest osteomyelitis. Dorsal soft tissue swelling is noted. IMPRESSION: No fracture or dislocation is noted. No lytic destruction is seen to suggest osteomyelitis. Dorsal soft tissue swelling is noted suggesting cellulitis or other inflammation. Electronically Signed   By: Marijo Conception, M.D.   On: 10/06/2018 11:38       Assessment & Plan:   Problem List Items Addressed This Visit    Daytime somnolence    Have been trying to arrange pulmonary  appt for evaluation of sleep apnea.  He is  unable to keep appt at this time.  Will let me know when he can schedule.        Diabetes ([place])    Brought in no sugar readings.  169 this am.  Discussed importance of diet and exercise.  Follow met b and a1c.        Relevant Medications   losartan (COZAAR) 100 MG tablet   Diabetic foot infection ([place])    Admitted with foot/leg cellulitis.  On clindamycin.  Improved.  Continues to wear a boot.  Has f/u this week with podiatry.        Relevant Medications   losartan (COZAAR) 100 MG tablet   Essential hypertension    Blood pressure elevated on my check.  Outside checks have been elevated.  Increase losartan to '100mg'$  q day.  Follow pressures.  Follow metabolic panel.        Relevant Medications   losartan (COZAAR) 100 MG tablet   History of CVA (cerebrovascular accident)    Has had extensive w/up.  On aspirin and plavix.  Follow.        Hyperlipidemia    On lipitor.  Low cholesterol diet and exercise.  Follow lipid panel and liver function tests.        Relevant Medications   losartan (COZAAR) 100 MG tablet   Hypothyroidism    On thyroid replacement.  Follow tsh.       Microalbuminuria    Increase losartan.       Neuropathy    On lyrica.           [name], MD

## 2018-10-16 ENCOUNTER — Encounter: Payer: Self-pay | Admitting: Internal Medicine

## 2018-10-16 NOTE — Assessment & Plan Note (Signed)
Admitted with foot/leg cellulitis.  On clindamycin.  Improved.  Continues to wear a boot.  Has f/u this week with podiatry.

## 2018-10-16 NOTE — Assessment & Plan Note (Signed)
Increase losartan.

## 2018-10-16 NOTE — Assessment & Plan Note (Signed)
On thyroid replacement.  Follow tsh.  

## 2018-10-16 NOTE — Assessment & Plan Note (Signed)
On lyrica

## 2018-10-16 NOTE — Assessment & Plan Note (Signed)
Blood pressure elevated on my check.  Outside checks have been elevated.  Increase losartan to 100mg  q day.  Follow pressures.  Follow metabolic panel.

## 2018-10-16 NOTE — Assessment & Plan Note (Signed)
On lipitor.  Low cholesterol diet and exercise.  Follow lipid panel and liver function tests.   

## 2018-10-16 NOTE — Assessment & Plan Note (Signed)
Has had extensive w/up.  On aspirin and plavix.  Follow.

## 2018-10-16 NOTE — Assessment & Plan Note (Signed)
Brought in no sugar readings.  169 this am.  Discussed importance of diet and exercise.  Follow met b and a1c.

## 2018-10-16 NOTE — Assessment & Plan Note (Signed)
Have been trying to arrange pulmonary appt for evaluation of sleep apnea.  He is unable to keep appt at this time.  Will let me know when he can schedule.

## 2018-10-19 ENCOUNTER — Ambulatory Visit (INDEPENDENT_AMBULATORY_CARE_PROVIDER_SITE_OTHER): Payer: Managed Care, Other (non HMO) | Admitting: *Deleted

## 2018-10-19 DIAGNOSIS — I639 Cerebral infarction, unspecified: Secondary | ICD-10-CM | POA: Diagnosis not present

## 2018-10-20 LAB — CUP PACEART REMOTE DEVICE CHECK
Date Time Interrogation Session: 20200223173953
Implantable Pulse Generator Implant Date: 20190604

## 2018-10-25 ENCOUNTER — Other Ambulatory Visit: Payer: Self-pay | Admitting: Internal Medicine

## 2018-10-27 NOTE — Progress Notes (Signed)
[facility] Summary Report / Loop Recorder 

## 2018-11-02 ENCOUNTER — Encounter: Payer: Self-pay | Admitting: Internal Medicine

## 2018-11-11 ENCOUNTER — Other Ambulatory Visit: Payer: Self-pay | Admitting: Internal Medicine

## 2018-11-16 ENCOUNTER — Other Ambulatory Visit: Payer: Self-pay | Admitting: Internal Medicine

## 2018-11-20 ENCOUNTER — Other Ambulatory Visit: Payer: Self-pay

## 2018-11-20 ENCOUNTER — Ambulatory Visit (INDEPENDENT_AMBULATORY_CARE_PROVIDER_SITE_OTHER): Payer: Managed Care, Other (non HMO) | Admitting: *Deleted

## 2018-11-20 DIAGNOSIS — I639 Cerebral infarction, unspecified: Secondary | ICD-10-CM | POA: Diagnosis not present

## 2018-11-21 LAB — CUP PACEART REMOTE DEVICE CHECK
Date Time Interrogation Session: 20200327183610
Implantable Pulse Generator Implant Date: 20190604

## 2018-11-23 NOTE — Progress Notes (Signed)
[facility] Summary Report / Loop Recorder 

## 2018-11-27 ENCOUNTER — Ambulatory Visit: Payer: Managed Care, Other (non HMO) | Admitting: Internal Medicine

## 2018-12-18 ENCOUNTER — Telehealth: Payer: Self-pay

## 2018-12-18 NOTE — Telephone Encounter (Signed)
Left message for patient regarding disconnected monitor.  

## 2018-12-23 ENCOUNTER — Ambulatory Visit (INDEPENDENT_AMBULATORY_CARE_PROVIDER_SITE_OTHER): Payer: Managed Care, Other (non HMO) | Admitting: *Deleted

## 2018-12-23 ENCOUNTER — Other Ambulatory Visit: Payer: Self-pay

## 2018-12-23 DIAGNOSIS — I639 Cerebral infarction, unspecified: Secondary | ICD-10-CM | POA: Diagnosis not present

## 2018-12-24 LAB — CUP PACEART REMOTE DEVICE CHECK
Date Time Interrogation Session: 20200429194024
Implantable Pulse Generator Implant Date: 20190604

## 2018-12-28 ENCOUNTER — Encounter: Payer: Self-pay | Admitting: Internal Medicine

## 2018-12-28 ENCOUNTER — Other Ambulatory Visit: Payer: Self-pay

## 2018-12-28 ENCOUNTER — Ambulatory Visit (INDEPENDENT_AMBULATORY_CARE_PROVIDER_SITE_OTHER): Payer: Managed Care, Other (non HMO) | Admitting: Family Medicine

## 2018-12-28 DIAGNOSIS — M7989 Other specified soft tissue disorders: Secondary | ICD-10-CM

## 2018-12-28 DIAGNOSIS — L03116 Cellulitis of left lower limb: Secondary | ICD-10-CM | POA: Diagnosis not present

## 2018-12-28 DIAGNOSIS — Z8673 Personal history of transient ischemic attack (TIA), and cerebral infarction without residual deficits: Secondary | ICD-10-CM

## 2018-12-28 NOTE — Telephone Encounter (Signed)
Patient has HX of cellulitis to extremity, patient says leg has been swelling a lot last few days, but denies heat or pain to leg, refused ER says he would like to have leg seen and then find out why he continue to have this problem scheduled with NP today at 3.20

## 2018-12-28 NOTE — Progress Notes (Signed)
Patient ID: Gregory Crane, male   DOB: 1970-08-29, 48 y.o.   MRN: [id number]  Virtual Visit via video Note  This visit type was conducted due to national recommendations for restrictions regarding the COVID-19 pandemic (e.g. social distancing).  This format is felt to be most appropriate for this patient at this time.  All issues noted in this document were discussed and addressed.  No physical exam was performed (except for noted visual exam findings with Video Visits).   I connected with Gregory Crane on 12/28/18 at  3:20 PM EDT by a video enabled telemedicine application verified that I am speaking with the correct person using two identifiers. Location patient: home Location provider: [facility] Persons participating in the virtual visit: patient, provider  I discussed the limitations, risks, security and privacy concerns of performing an evaluation and management service by video and the availability of in person appointments. I also discussed with the patient that there may be a patient responsible charge related to this service. The patient expressed understanding and agreed to proceed.   HPI: Patient and I connected via video due to concerns over left leg swelling and that potentially could be a cellulitis infection.  Patient states he has had something similar like this happen before and would prefer not to have to go to the emergency room if he can avoid.  Patient states the left leg looks swollen compared to the right, but does not appear red and does not feel warm or hot.  States left leg is slightly tender when it is touched due to the swelling, but would not describe it as extremely painful.  Denies fever or chills.  Denies chest pain, shortness breath or wheezing.  Denies GI or GU issues.    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   ROS: See pertinent positives and negatives per HPI.  Past Medical History:  Diagnosis Date  . Allergy   . Bell's palsy   . Diabetes mellitus without complication (HCC)    diet controlled  . Hypertension   . Hypothyroidism   . Kidney stones   . Stroke [facility])     Past Surgical History:  Procedure Laterality Date  . LOOP RECORDER INSERTION N/A 01/27/2018   Procedure: LOOP RECORDER INSERTION;  Surgeon: [name], MD;  Location: [facility];  Service: Cardiovascular;  Laterality: N/A;  . LUMBAR PUNCTURE     as child  . NO PAST SURGERIES    . TEE WITHOUT CARDIOVERSION N/A 01/07/2018   Procedure: TRANSESOPHAGEAL ECHOCARDIOGRAM (TEE);  Surgeon: Minna Merritts, MD;  Location: [facility];  Service: Cardiovascular;  Laterality: N/A;    Family History  Problem Relation Age of Onset  . Breast cancer Mother   . Diabetes Father   . Diabetes Sister   . Arthritis Maternal Grandmother   . Diabetes Maternal Grandmother    Social History   Tobacco Use  . Smoking status: Never Smoker  . Smokeless tobacco: Never Used  Substance Use Topics  . Alcohol use: No    Alcohol/week: 0.0 standard drinks    Comment: very rarely (1-2/year)    Current Outpatient Medications:  .  ASPIRIN LOW DOSE 81 MG EC tablet, TAKE 1 TABLET(81 MG) BY MOUTH DAILY, Disp: 90 tablet, Rfl: 0 .  atorvastatin (LIPITOR) 80 MG tablet, Take 80 mg by mouth Nightly. , Disp: , Rfl: 2 .  clopidogrel (PLAVIX) 75 MG tablet, TAKE 1 TABLET(75 MG) BY MOUTH DAILY, Disp: 90 tablet, Rfl: 0 .  DULoxetine (CYMBALTA) 20 MG capsule, Take 20 mg by mouth daily. , Disp: , Rfl: 3 .  glucose blood (FREESTYLE LITE) test strip, Check blood sugars twice a day (Dx. 250.02), Disp: 200 each, Rfl: 1 .  HUMALOG 100 UNIT/ML injection, INJECT 4 TO 6 UNITS UNDER THE SKIN THREE TIMES DAILY, Disp: 30 mL, Rfl: 0 .  INSULIN SYRINGE .5CC/29G 29G X 1/2" 0.5 ML MISC, USE AS DIRECTED, Disp: 300 each,  Rfl: 0 .  Insulin Syringe-Needle U-100 (INSULIN SYRINGE .5CC/30GX5/16") 30G X 5/16" 0.5 ML MISC, USE AS DIRECTED, Disp: 300 each, Rfl: 0 .  Insulin Syringe-Needle U-100 27G X 1/2" 1 ML MISC, Use as directed with insulin. One needle per use., Disp: 100 each, Rfl: 2 .  LANTUS 100 UNIT/ML injection, INJECT 15 UNITS UNDER THE SKIN EVERY NIGHT AT BEDTIME, Disp: 30 mL, Rfl: 0 .  levothyroxine (SYNTHROID, LEVOTHROID) 112 MCG tablet, TAKE 1 TABLET(112 MCG) BY MOUTH DAILY BEFORE BREAKFAST, Disp: 90 tablet, Rfl: 0 .  losartan (COZAAR) 100 MG tablet, Take 1 tablet (100 mg total) by mouth daily., Disp: 30 tablet, Rfl: 3 .  metFORMIN (GLUCOPHAGE) 500 MG tablet, TAKE 1 TABLET BY MOUTH DAILY WITH BREAKFAST, Disp: 90 tablet, Rfl: 0 .  ONETOUCH DELICA LANCETS FINE MISC, 1 applicator by Other route 4 (four) times daily., Disp: , Rfl: 0 .  pregabalin (LYRICA) 100 MG capsule, Take 1 capsule (100 mg total) by mouth 3 (three) times daily., Disp: 270 capsule, Rfl: 1  EXAM:  GENERAL: alert, oriented, appears well and in no acute distress  HEENT: atraumatic, conjunttiva clear, no obvious abnormalities on inspection of external nose and ears  NECK: normal movements of the head and neck  LUNGS: on inspection no signs of respiratory distress, breathing rate appears normal, no obvious gross SOB, gasping or wheezing  CV: no obvious cyanosis  LEs: Patient able to move the camera view down so I can see his legs.  Over video feed left leg does appear somewhat more swollen than the right, but it appears he does have some chronic lower extremity swelling bilaterally.  No obvious redness of skin seen over video feed.  Patient is able to press fingers into left lower extremity and leave a small index, I would rate this as a +1 pitting edema and left lower extremity.  Able to wiggle toes of both feet, bend both knees and rolled both ankles without problems.  MS: moves all visible extremities without noticeable  abnormality  PSYCH/NEURO: pleasant and cooperative, no obvious depression or anxiety, speech and thought processing grossly intact  ASSESSMENT AND PLAN:  Discussed the following assessment and plan:  Left leg swelling - Plan: US Venous Img Lower Unilateral Left  History of CVA (cerebrovascular accident) - Plan: US Venous Img Lower Unilateral Left  Due to patient's swelling of left leg and also has history of CVA on top of multiple other comorbidities, we must rule out DVT.  We will get Doppler schedule the left lower extremity to rule this out.  If it is negative, patient swelling could either be from fluid and or early sign of a developing cellulitis.  Once we have Doppler results we will better be able to determine next plan of care.   I discussed the assessment and treatment plan with  the patient. The patient was provided an opportunity to ask questions and all were answered. The patient agreed with the plan and demonstrated an understanding of the instructions.   The patient was advised to call back or seek an in-person evaluation if the symptoms worsen or if the condition fails to improve as anticipated.   Jodelle Green, FNP

## 2018-12-29 ENCOUNTER — Encounter: Payer: Self-pay | Admitting: Family Medicine

## 2018-12-29 ENCOUNTER — Ambulatory Visit
Admission: RE | Admit: 2018-12-29 | Discharge: 2018-12-29 | Disposition: A | Discharge status: Home / Self Care | Payer: Managed Care, Other (non HMO) | Source: Ambulatory Visit | Attending: Family Medicine | Admitting: Family Medicine

## 2018-12-29 DIAGNOSIS — Z8673 Personal history of transient ischemic attack (TIA), and cerebral infarction without residual deficits: Secondary | ICD-10-CM | POA: Diagnosis present

## 2018-12-29 DIAGNOSIS — M7989 Other specified soft tissue disorders: Secondary | ICD-10-CM | POA: Diagnosis present

## 2018-12-29 MED ORDER — CEPHALEXIN 500 MG PO CAPS: 500.0000 mg | ORAL_CAPSULE | Freq: Four times a day (QID) | ORAL | 0 refills | Status: DC

## 2018-12-29 NOTE — Addendum Note (Signed)
Addended by: Philis Nettle on: 12/29/2018 11:59 AM   Modules accepted: Orders

## 2019-01-01 NOTE — Progress Notes (Signed)
[facility] Summary Report / Loop Recorder 

## 2019-01-05 ENCOUNTER — Encounter: Payer: Self-pay | Admitting: Internal Medicine

## 2019-01-06 ENCOUNTER — Other Ambulatory Visit: Payer: Self-pay | Admitting: Internal Medicine

## 2019-01-06 NOTE — Telephone Encounter (Signed)
Please contact patient -- he will need another visit. It can be in person as long as he passes covid screening  His doppler was negative for DVT, so we treated as possible cellulitis. Per patient he does not feel he is improving.  Please ask if he will do another visit at least over DOXY or in person visit  Thanks.  LG

## 2019-01-06 NOTE — Telephone Encounter (Signed)
Looks like Ander Purpura has already addressed.  Let me know if I need to do anything.

## 2019-01-08 ENCOUNTER — Other Ambulatory Visit: Payer: Self-pay

## 2019-01-08 ENCOUNTER — Encounter: Payer: Self-pay | Admitting: Internal Medicine

## 2019-01-08 ENCOUNTER — Ambulatory Visit (INDEPENDENT_AMBULATORY_CARE_PROVIDER_SITE_OTHER): Payer: Managed Care, Other (non HMO) | Admitting: Internal Medicine

## 2019-01-08 DIAGNOSIS — M7989 Other specified soft tissue disorders: Secondary | ICD-10-CM

## 2019-01-08 NOTE — Telephone Encounter (Signed)
Spoke with pt. Leg is still swollen. No redness, heat, discoloration. Only aches after he has been up on it all day. Completed ABX and was negative for DVT. Scheduled for virtual visit with Dr.[name]

## 2019-01-08 NOTE — Progress Notes (Signed)
Patient ID: Gregory Crane, male   DOB: 1971-08-06, 48 y.o.   MRN: [id number]   Virtual Visit via Video Note  This visit type was conducted due to national recommendations for restrictions regarding the COVID-19 pandemic (e.g. social distancing).  This format is felt to be most appropriate for this patient at this time.  All issues noted in this document were discussed and addressed.  No physical exam was performed (except for noted visual exam findings with Video Visits).   I connected with Vernia Buff Brandi by a video enabled telemedicine application and verified that I am speaking with the correct person using two identifiers. Location patient: work Location provider: work Persons participating in the virtual visit: patient, provider  I discussed the limitations, risks, security and privacy concerns of performing an evaluation and management service by video and the availability of in person appointments.  The patient expressed understanding and agreed to proceed.   Reason for visit: acute visit.    HPI: He was evaluated 12/28/18 by Philis Nettle for left leg swelling.  Ultrasound negative for DVT.  Treated with abx for concern regarding cellulitis.  States he still has the swelling.  Started approximately 2 months ago.  Worsened recently.  Is on his feet all day.  Working 5-6 days per week.  Long hours.  Swelling not improving with lying down.  Initially did improve with leg elevation.  Some redness when he is standing for long periods, but when sits and elevates leg redness goes away.  Not wearing compression hose.  Discussed the need to wear compression hose.  Discussed concern regarding venous insufficiency, etc.  Discussed the need for vascular surgery evaluation.     ROS: See pertinent positives and negatives per HPI.  Past Medical History:  Diagnosis Date  . Allergy   . Bell's palsy   . Diabetes mellitus without complication (HCC)    diet controlled  . Hypertension   . Hypothyroidism    . Kidney stones   . Stroke [facility])     Past Surgical History:  Procedure Laterality Date  . LOOP RECORDER INSERTION N/A 01/27/2018   Procedure: LOOP RECORDER INSERTION;  Surgeon: [name], MD;  Location: [facility];  Service: Cardiovascular;  Laterality: N/A;  . LUMBAR PUNCTURE     as child  . NO PAST SURGERIES    . TEE WITHOUT CARDIOVERSION N/A 01/07/2018   Procedure: TRANSESOPHAGEAL ECHOCARDIOGRAM (TEE);  Surgeon: Minna Merritts, MD;  Location: [facility];  Service: Cardiovascular;  Laterality: N/A;    Family History  Problem Relation Age of Onset  . Breast cancer Mother   . Diabetes Father   . Diabetes Sister   . Arthritis Maternal Grandmother   . Diabetes Maternal Grandmother     SOCIAL HX: reviewed.    Current Outpatient Medications:  .  ASPIRIN LOW DOSE 81 MG EC tablet, TAKE 1 TABLET(81 MG) BY MOUTH DAILY, Disp: 90 tablet, Rfl: 0 .  atorvastatin (LIPITOR) 80 MG tablet, TAKE 1 TABLET BY MOUTH DAILY, Disp: 90 tablet, Rfl: 0 .  clopidogrel (PLAVIX) 75 MG tablet, TAKE 1 TABLET(75 MG) BY MOUTH DAILY, Disp: 90 tablet, Rfl: 0 .  DULoxetine (CYMBALTA) 20 MG capsule, Take 20 mg by mouth daily. , Disp: , Rfl: 3 .  glucose blood (FREESTYLE LITE) test strip, Check blood sugars twice a day (Dx. 250.02), Disp: 200 each, Rfl: 1 .  HUMALOG 100 UNIT/ML injection, INJECT 4 TO 6 UNITS UNDER THE SKIN THREE TIMES DAILY, Disp:  30 mL, Rfl: 0 .  INSULIN SYRINGE .5CC/29G 29G X 1/2" 0.5 ML MISC, USE AS DIRECTED, Disp: 300 each, Rfl: 0 .  Insulin Syringe-Needle U-100 (INSULIN SYRINGE .5CC/30GX5/16") 30G X 5/16" 0.5 ML MISC, USE AS DIRECTED, Disp: 300 each, Rfl: 0 .  Insulin Syringe-Needle U-100 27G X 1/2" 1 ML MISC, Use as directed with insulin. One needle per use., Disp: 100 each, Rfl: 2 .  LANTUS 100 UNIT/ML injection, INJECT 15 UNITS UNDER THE SKIN EVERY NIGHT AT BEDTIME, Disp: 30 mL, Rfl: 0 .  levothyroxine (SYNTHROID, LEVOTHROID) 112 MCG tablet, TAKE 1 TABLET(112 MCG) BY MOUTH  DAILY BEFORE BREAKFAST, Disp: 90 tablet, Rfl: 0 .  losartan (COZAAR) 100 MG tablet, Take 1 tablet (100 mg total) by mouth daily., Disp: 30 tablet, Rfl: 3 .  metFORMIN (GLUCOPHAGE) 500 MG tablet, TAKE 1 TABLET BY MOUTH DAILY WITH BREAKFAST, Disp: 90 tablet, Rfl: 0 .  ONETOUCH DELICA LANCETS FINE MISC, 1 applicator by Other route 4 (four) times daily., Disp: , Rfl: 0 .  pregabalin (LYRICA) 100 MG capsule, Take 1 capsule (100 mg total) by mouth 3 (three) times daily., Disp: 270 capsule, Rfl: 1  EXAM:  GENERAL: alert, oriented, appears well and in no acute distress  HEENT: atraumatic, conjunttiva clear, no obvious abnormalities on inspection of external nose and ears  NECK: normal movements of the head and neck  LUNGS: on inspection no signs of respiratory distress, breathing rate appears normal, no obvious gross SOB, gasping or wheezing  CV: no obvious cyanosis  MS: moves all visible extremities without noticeable abnormality.  Increased swelling - left pedal edema and extending up into the ankle.  No swelling of left knee or left upper thigh.  No increased redness of the calf or thigh.  Appears to have some stasis changes lower extremity.   PSYCH/NEURO: pleasant and cooperative, no obvious depression or anxiety, speech and thought processing grossly intact  ASSESSMENT AND PLAN:  Discussed the following assessment and plan:  Swelling of left lower extremity - Plan: Ambulatory referral to Vascular Surgery  Swelling of left lower extremity Recent ultrasound negative for DVT.  Recently treated with keflex.  Discussed leg elevation and need to wear compression hose.  Hold further abx.  Needs vascular evaluation given persistent swelling.  Pt in agreement.      I discussed the assessment and treatment plan with the patient. The patient was provided an opportunity to ask questions and all were answered. The patient agreed with the plan and demonstrated an understanding of the instructions.    The patient was advised to call back or seek an in-person evaluation if the symptoms worsen or if the condition fails to improve as anticipated.    [name], MD

## 2019-01-10 DIAGNOSIS — M7989 Other specified soft tissue disorders: Secondary | ICD-10-CM | POA: Insufficient documentation

## 2019-01-10 NOTE — Assessment & Plan Note (Signed)
Recent ultrasound negative for DVT.  Recently treated with keflex.  Discussed leg elevation and need to wear compression hose.  Hold further abx.  Needs vascular evaluation given persistent swelling.  Pt in agreement.

## 2019-01-15 ENCOUNTER — Ambulatory Visit (INDEPENDENT_AMBULATORY_CARE_PROVIDER_SITE_OTHER): Payer: Managed Care, Other (non HMO) | Admitting: Vascular Surgery

## 2019-01-15 ENCOUNTER — Encounter (INDEPENDENT_AMBULATORY_CARE_PROVIDER_SITE_OTHER): Payer: Self-pay | Admitting: Vascular Surgery

## 2019-01-15 ENCOUNTER — Other Ambulatory Visit: Payer: Self-pay

## 2019-01-15 VITALS — BP 200/113 | HR 78 | Resp 16 | Ht 72.0 in | Wt 295.0 lb

## 2019-01-15 DIAGNOSIS — E785 Hyperlipidemia, unspecified: Secondary | ICD-10-CM

## 2019-01-15 DIAGNOSIS — Z79899 Other long term (current) drug therapy: Secondary | ICD-10-CM

## 2019-01-15 DIAGNOSIS — M7989 Other specified soft tissue disorders: Secondary | ICD-10-CM

## 2019-01-15 DIAGNOSIS — I10 Essential (primary) hypertension: Secondary | ICD-10-CM | POA: Diagnosis not present

## 2019-01-15 DIAGNOSIS — E11319 Type 2 diabetes mellitus with unspecified diabetic retinopathy without macular edema: Secondary | ICD-10-CM | POA: Diagnosis not present

## 2019-01-15 DIAGNOSIS — Z794 Long term (current) use of insulin: Secondary | ICD-10-CM

## 2019-01-15 NOTE — Telephone Encounter (Signed)
Old message.  Pt had virtual visit and is seeing [facility] today.

## 2019-01-15 NOTE — Assessment & Plan Note (Signed)
Now with early swelling on the right lower extremity as well.  I have had a long discussion with the patient regarding swelling and why it  causes symptoms.  Patient will begin wearing graduated compression stockings class 1 (20-30 mmHg) on a daily basis and a prescription was given. The patient will  beginning wearing the stockings first thing in the morning and removing them in the evening. The patient is instructed specifically not to sleep in the stockings.   In addition, behavioral modification will be initiated.  This will include frequent elevation, use of over the counter pain medications and exercise such as walking.  I have reviewed systemic causes for chronic edema such as liver, kidney and cardiac etiologies.  The patient denies problems with these organ systems.    Consideration for a lymph pump will also be made based upon the effectiveness of conservative therapy.  This would help to improve the edema control and prevent sequela such as ulcers and infections   Patient should undergo duplex ultrasound of the venous system to ensure that DVT or reflux is not present.  The patient will follow-up with me after the ultrasound.

## 2019-01-15 NOTE — Progress Notes (Signed)
Patient ID: Gregory Crane, male   DOB: 04-13-1971, 48 y.o.   MRN: [id number]  Chief Complaint  Patient presents with  . New Patient (Initial Visit)    ref Gregory Crane    HPI Gregory Crane is a 48 y.o. male.  I am asked to see the patient by Dr. Nicki Crane for evaluation of leg Crane.  This is predominantly in the left leg although the right leg has become Crane some as well.  This really has only been an issue over the past few months.  He does have a previous history of cellulitis in the left leg a couple of years ago that was treated with antibiotics.  Since that time, he has had some mild left leg Crane but nothing particularly noticeable.  A few months ago, without inciting event or causative factor, the Crane became very prominent.  Nothing is really made to get better.  He is tried some elevation.  He has not yet tried compression stockings.  Has no ulceration or infection.  No fevers or chills or signs of infection currently.  He was treated with some antibiotics but that has not helped.  He has no previous history of DVT or superficial thrombophlebitis to his knowledge.  He had a duplex study earlier this month but that was done to rule out DVT and none was present.  A functional venous study has not been performed.   Past Medical History:  Diagnosis Date  . Allergy   . Bell's palsy   . Diabetes mellitus without complication (HCC)    diet controlled  . Hypertension   . Hypothyroidism   . Kidney stones   . Stroke [facility])     Past Surgical History:  Procedure Laterality Date  . LOOP RECORDER INSERTION N/A 01/27/2018   Procedure: LOOP RECORDER INSERTION;  Surgeon: [name], MD;  Location: [place] [facility];  Service: Cardiovascular;  Laterality: N/A;  . LUMBAR PUNCTURE     as child  . NO PAST SURGERIES    . TEE WITHOUT CARDIOVERSION N/A 01/07/2018   Procedure: TRANSESOPHAGEAL ECHOCARDIOGRAM (TEE);  Surgeon: Minna Merritts, MD;  Location: [facility];   Service: Cardiovascular;  Laterality: N/A;    Family History Family History  Problem Relation Age of Onset  . Breast cancer Mother   . Diabetes Father   . Diabetes Sister   . Arthritis Maternal Grandmother   . Diabetes Maternal Grandmother      Social History Social History   Tobacco Use  . Smoking status: Never Smoker  . Smokeless tobacco: Never Used  Substance Use Topics  . Alcohol use: No    Alcohol/week: 0.0 standard drinks    Comment: very rarely (1-2/year)  . Drug use: No    No Known Allergies  Current Outpatient Medications  Medication Sig Dispense Refill  . ASPIRIN LOW DOSE 81 MG EC tablet TAKE 1 TABLET(81 MG) BY MOUTH DAILY 90 tablet 0  . atorvastatin (LIPITOR) 80 MG tablet TAKE 1 TABLET BY MOUTH DAILY 90 tablet 0  . clopidogrel (PLAVIX) 75 MG tablet TAKE 1 TABLET(75 MG) BY MOUTH DAILY 90 tablet 0  . DULoxetine (CYMBALTA) 20 MG capsule Take 20 mg by mouth daily.   3  . glucose blood (FREESTYLE LITE) test strip Check blood sugars twice a day (Dx. 250.02) 200 each 1  . HUMALOG 100 UNIT/ML injection INJECT 4 TO 6 UNITS UNDER THE SKIN THREE TIMES DAILY 30 mL 0  . INSULIN SYRINGE .  5CC/29G 29G X 1/2" 0.5 ML MISC USE AS DIRECTED 300 each 0  . Insulin Syringe-Needle U-100 (INSULIN SYRINGE .5CC/30GX5/16") 30G X 5/16" 0.5 ML MISC USE AS DIRECTED 300 each 0  . Insulin Syringe-Needle U-100 27G X 1/2" 1 ML MISC Use as directed with insulin. One needle per use. 100 each 2  . LANTUS 100 UNIT/ML injection INJECT 15 UNITS UNDER THE SKIN EVERY NIGHT AT BEDTIME 30 mL 0  . levothyroxine (SYNTHROID, LEVOTHROID) 112 MCG tablet TAKE 1 TABLET(112 MCG) BY MOUTH DAILY BEFORE BREAKFAST 90 tablet 0  . losartan (COZAAR) 100 MG tablet Take 1 tablet (100 mg total) by mouth daily. 30 tablet 3  . metFORMIN (GLUCOPHAGE) 500 MG tablet TAKE 1 TABLET BY MOUTH DAILY WITH BREAKFAST 90 tablet 0  . ONETOUCH DELICA LANCETS FINE MISC 1 applicator by Other route 4 (four) times daily.  0  . pregabalin  (LYRICA) 100 MG capsule Take 1 capsule (100 mg total) by mouth 3 (three) times daily. 270 capsule 1   No current facility-administered medications for this visit.       REVIEW OF SYSTEMS (Negative unless checked)  Constitutional: [] Weight loss  [] Fever  [] Chills Cardiac: [] Chest Crane   [] Chest pressure   [] Palpitations   [] Shortness of breath when laying flat   [] Shortness of breath at rest   [] Shortness of breath with exertion. Vascular:  [x] Crane in legs with walking   [x] Crane in legs at rest   [] Crane in legs when laying flat   [] Claudication   [] Crane in feet when walking  [] Crane in feet at rest  [] Crane in feet when laying flat   [] History of DVT   [] Phlebitis   [x] Crane in legs   [] Varicose veins   [] Non-healing ulcers Pulmonary:   [] Uses home oxygen   [] Productive cough   [] Hemoptysis   [] Wheeze  [] COPD   [] Asthma Neurologic:  [] Dizziness  [] Blackouts   [] Seizures   [x] History of stroke   [] History of TIA  [] Aphasia   [] Temporary blindness   [] Dysphagia   [] Weakness or numbness in arms   [] Weakness or numbness in legs Musculoskeletal:  [] Arthritis   [] Joint Crane   [] Joint Crane   [] Low back Crane Hematologic:  [] Easy bruising  [] Easy bleeding   [] Hypercoagulable state   [] Anemic  [] Hepatitis Gastrointestinal:  [] Blood in stool   [] Vomiting blood  [] Gastroesophageal reflux/heartburn   [] Abdominal Crane Genitourinary:  [] Chronic kidney disease   [] Difficult urination  [] Frequent urination  [] Burning with urination   [] Hematuria Skin:  [] Rashes   [] Ulcers   [] Wounds Psychological:  [] History of anxiety   []  History of major depression.    Physical Exam BP (!) 200/113 (BP Location: Right Arm)   Pulse 78   Resp 16   Ht 6' (1.829 m)   Wt 295 lb (133.8 kg)   BMI 40.01 kg/m  Gen:  WD/WN, NAD. Obese  Head: [place]/AT, No temporalis wasting.  Ear/Nose/Throat: Hearing grossly intact, nares w/o erythema or drainage, oropharynx w/o Erythema/Exudate Eyes: Conjunctiva clear, sclera non-icteric   Neck: trachea midline.  No JVD.  Pulmonary:  Good air movement, respirations not labored, no use of accessory muscles  Cardiac: RRR, no JVD Vascular:  Vessel Right Left  Radial Palpable Palpable                          DP 2+ Trace   PT 1+ 1+   Gastrointestinal:. No masses, surgical incisions, or scars. Musculoskeletal: M/S 5/5 throughout.  Extremities without ischemic changes.  No deformity or atrophy. Trace RLE edema, 2-3+ LLE edema. Neurologic: Sensation grossly intact in extremities.  Symmetrical.  Speech is fluent. Motor exam as listed above. Psychiatric: Judgment intact, Mood & affect appropriate for pt's clinical situation. Dermatologic: No rashes or ulcers noted.  No cellulitis or open wounds.    Radiology US Venous Img Lower Unilateral Left  Result Date: 12/29/2018 CLINICAL DATA:  48 year old male with Crane EXAM: LEFT LOWER EXTREMITY VENOUS DOPPLER ULTRASOUND TECHNIQUE: Gray-scale sonography with graded compression, as well as color Doppler and duplex ultrasound were performed to evaluate the lower extremity deep venous systems from the level of the common femoral vein and including the common femoral, femoral, profunda femoral, popliteal and calf veins including the posterior tibial, peroneal and gastrocnemius veins when visible. The superficial great saphenous vein was also interrogated. Spectral Doppler was utilized to evaluate flow at rest and with distal augmentation maneuvers in the common femoral, femoral and popliteal veins. COMPARISON:  None. FINDINGS: Contralateral Common Femoral Vein: Respiratory phasicity is normal and symmetric with the symptomatic side. No evidence of thrombus. Normal compressibility. Common Femoral Vein: No evidence of thrombus. Normal compressibility, respiratory phasicity and response to augmentation. Saphenofemoral Junction: No evidence of thrombus. Normal compressibility and flow on color Doppler imaging. Profunda Femoral Vein: No evidence of  thrombus. Normal compressibility and flow on color Doppler imaging. Femoral Vein: No evidence of thrombus. Normal compressibility, respiratory phasicity and response to augmentation. Popliteal Vein: No evidence of thrombus. Normal compressibility, respiratory phasicity and response to augmentation. Calf Veins: No evidence of thrombus. Normal compressibility and flow on color Doppler imaging. Superficial Great Saphenous Vein: No evidence of thrombus. Normal compressibility and flow on color Doppler imaging. Other Findings: Lymph nodes of the inguinal region, with typical architecture maintained. Edema IMPRESSION: Sonographic survey of the left lower extremity negative for DVT. Edema. Inguinal lymph nodes, likely reactive, with typical architecture maintained Electronically Signed   By: Corrie Mckusick D.O.   On: 12/29/2018 10:36    Labs Recent Results (from the past 2160 hour(s))  CUP PACEART REMOTE DEVICE CHECK     Status: None   Collection Time: 10/19/18  5:39 PM  Result Value Ref Range   Date Time Interrogation Session [id number]    Pulse Generator Manufacturer [employer]    Pulse Gen Model [id number] Reveal LINQ    Pulse Gen Serial Number [id number]    Clinic Name [facility]    Implantable Pulse Generator Type ICM/ILR    Implantable Pulse Generator Implant Date [id number]   CUP PACEART REMOTE DEVICE CHECK     Status: None   Collection Time: 11/20/18  6:36 PM  Result Value Ref Range   Date Time Interrogation Session [id number]    Pulse Generator Manufacturer [employer]    Pulse Gen Model FRT02 Reveal LINQ    Pulse Gen Serial Number [id number]    Clinic Name [facility]    Implantable Pulse Generator Type ICM/ILR    Implantable Pulse Generator Implant Date 14103013   CUP PACEART REMOTE DEVICE CHECK     Status: None   Collection Time: 12/23/18  7:40 PM  Result Value Ref Range   Date Time Interrogation Session [id number]    Pulse Generator Manufacturer [facility]    Pulse Gen Model SUO15  Reveal LINQ    Pulse Gen Serial Number [id number]    Clinic Name [facility]    Implantable Pulse Generator Type ICM/ILR    Implantable Pulse Generator Implant Date [id number]    Eval  Rhythm SR at 85 bpm     Assessment/Plan:  Essential hypertension blood pressure control important in reducing the progression of atherosclerotic disease. On appropriate oral medications.   Diabetes blood glucose control important in reducing the progression of atherosclerotic disease. Also, involved in wound healing. On appropriate medications.   Hyperlipidemia lipid control important in reducing the progression of atherosclerotic disease. Continue statin therapy   Crane of left lower extremity Now with early Crane on the right lower extremity as well.  I have had a long discussion with the patient regarding Crane and why it  causes symptoms.  Patient will begin wearing graduated compression stockings class 1 (20-30 mmHg) on a daily basis and a prescription was given. The patient will  beginning wearing the stockings first thing in the morning and removing them in the evening. The patient is instructed specifically not to sleep in the stockings.   In addition, behavioral modification will be initiated.  This will include frequent elevation, use of over the counter Crane medications and exercise such as walking.  I have reviewed systemic causes for chronic edema such as liver, kidney and cardiac etiologies.  The patient denies problems with these organ systems.    Consideration for a lymph pump will also be made based upon the effectiveness of conservative therapy.  This would help to improve the edema control and prevent sequela such as ulcers and infections   Patient should undergo duplex ultrasound of the venous system to ensure that DVT or reflux is not present.  The patient will follow-up with me after the ultrasound.        Gregory Crane 01/15/2019, 2:18 PM   This note was  created with Dragon medical transcription system.  Any errors from dictation are unintentional.

## 2019-01-15 NOTE — Assessment & Plan Note (Signed)
blood pressure control important in reducing the progression of atherosclerotic disease. On appropriate oral medications.  

## 2019-01-15 NOTE — Assessment & Plan Note (Signed)
lipid control important in reducing the progression of atherosclerotic disease. Continue statin therapy  

## 2019-01-15 NOTE — Patient Instructions (Signed)

## 2019-01-15 NOTE — Assessment & Plan Note (Signed)
blood glucose control important in reducing the progression of atherosclerotic disease. Also, involved in wound healing. On appropriate medications.  

## 2019-01-25 ENCOUNTER — Ambulatory Visit (INDEPENDENT_AMBULATORY_CARE_PROVIDER_SITE_OTHER): Payer: Managed Care, Other (non HMO) | Admitting: *Deleted

## 2019-01-25 DIAGNOSIS — I639 Cerebral infarction, unspecified: Secondary | ICD-10-CM

## 2019-01-26 LAB — CUP PACEART REMOTE DEVICE CHECK
Date Time Interrogation Session: 20200601200950
Implantable Pulse Generator Implant Date: 20190604

## 2019-01-28 ENCOUNTER — Encounter: Payer: Self-pay | Admitting: Internal Medicine

## 2019-01-28 ENCOUNTER — Other Ambulatory Visit: Payer: Self-pay

## 2019-01-28 MED ORDER — LEVOTHYROXINE SODIUM 112 MCG PO TABS: ORAL_TABLET | ORAL | 2 refills | Status: DC

## 2019-01-28 MED ORDER — CLOPIDOGREL BISULFATE 75 MG PO TABS: ORAL_TABLET | ORAL | 2 refills | Status: DC

## 2019-01-28 MED ORDER — METFORMIN HCL 500 MG PO TABS: 500.0000 mg | ORAL_TABLET | Freq: Every day | ORAL | 2 refills | Status: DC

## 2019-02-02 NOTE — Progress Notes (Signed)
[facility] Summary Report / Loop Recorder 

## 2019-03-01 ENCOUNTER — Encounter: Payer: Managed Care, Other (non HMO) | Admitting: *Deleted

## 2019-03-03 ENCOUNTER — Other Ambulatory Visit: Payer: Self-pay | Admitting: Internal Medicine

## 2019-03-03 MED ORDER — "INSULIN SYRINGE 29G X 1/2"" 0.5 ML MISC": 0 refills | Status: DC

## 2019-03-03 NOTE — Telephone Encounter (Signed)
Refill sent.

## 2019-03-03 NOTE — Telephone Encounter (Signed)
Medication:INSULIN SYRINGE .5CC/29G 29G X 1/2" 0.5 ML MISC [[id number]]    Pharmacy: [id number] [address], [place] [address] [phone] (Phone) [phone] (Fax)     Pt is completley out.

## 2019-03-05 ENCOUNTER — Encounter (INDEPENDENT_AMBULATORY_CARE_PROVIDER_SITE_OTHER): Payer: Self-pay | Admitting: Vascular Surgery

## 2019-03-05 ENCOUNTER — Ambulatory Visit (INDEPENDENT_AMBULATORY_CARE_PROVIDER_SITE_OTHER): Payer: Managed Care, Other (non HMO) | Admitting: Vascular Surgery

## 2019-03-05 ENCOUNTER — Other Ambulatory Visit: Payer: Self-pay

## 2019-03-05 ENCOUNTER — Ambulatory Visit (INDEPENDENT_AMBULATORY_CARE_PROVIDER_SITE_OTHER): Payer: Managed Care, Other (non HMO)

## 2019-03-05 VITALS — BP 211/110 | HR 77 | Resp 16 | Wt 296.0 lb

## 2019-03-05 DIAGNOSIS — R600 Localized edema: Secondary | ICD-10-CM

## 2019-03-05 DIAGNOSIS — M7989 Other specified soft tissue disorders: Secondary | ICD-10-CM

## 2019-03-05 DIAGNOSIS — E119 Type 2 diabetes mellitus without complications: Secondary | ICD-10-CM

## 2019-03-05 DIAGNOSIS — Z79899 Other long term (current) drug therapy: Secondary | ICD-10-CM

## 2019-03-05 DIAGNOSIS — E11319 Type 2 diabetes mellitus with unspecified diabetic retinopathy without macular edema: Secondary | ICD-10-CM

## 2019-03-05 DIAGNOSIS — Z794 Long term (current) use of insulin: Secondary | ICD-10-CM

## 2019-03-05 DIAGNOSIS — E785 Hyperlipidemia, unspecified: Secondary | ICD-10-CM | POA: Diagnosis not present

## 2019-03-05 DIAGNOSIS — I10 Essential (primary) hypertension: Secondary | ICD-10-CM | POA: Diagnosis not present

## 2019-03-05 NOTE — Assessment & Plan Note (Signed)
Has been diagnosed with fractures in the left foot that would explain the left lower extremity swelling.  He is now in a walking boot.  Swelling is reasonably stable.  We will give this several months to heal to determine how much the swelling goes down.  No evidence of venous disease but could certainly developed lymphedema.  Plan to follow-up in 3 to 4 months.

## 2019-03-05 NOTE — Progress Notes (Signed)
MRN : [id number]  Gregory Crane is a 48 y.o. (1971/02/18) male who presents with chief complaint of  Chief Complaint  Patient presents with  . Follow-up    ultrasound follow up  .  History of Present Illness: Patient returns today in follow up of his left leg swelling.  He has since been found to have fractures in his left foot in 2 places and has been put in a walking boot by the podiatrist.  No new ulceration or infection.  Swelling is reasonably stable to slightly improved.  Reflux study today shows no reflux in either lower extremity of significance with only minimal reflux in both common femoral veins.  No superficial venous reflux was seen.  No SVT or DVT was present.  Current Outpatient Medications  Medication Sig Dispense Refill  . ASPIRIN LOW DOSE 81 MG EC tablet TAKE 1 TABLET(81 MG) BY MOUTH DAILY 90 tablet 0  . atorvastatin (LIPITOR) 80 MG tablet TAKE 1 TABLET BY MOUTH DAILY 90 tablet 0  . clopidogrel (PLAVIX) 75 MG tablet TAKE 1 TABLET(75 MG) BY MOUTH DAILY 90 tablet 2  . DULoxetine (CYMBALTA) 20 MG capsule Take 20 mg by mouth daily.   3  . glucose blood (FREESTYLE LITE) test strip Check blood sugars twice a day (Dx. 250.02) 200 each 1  . HUMALOG 100 UNIT/ML injection INJECT 4 TO 6 UNITS UNDER THE SKIN THREE TIMES DAILY 30 mL 0  . INSULIN SYRINGE .5CC/29G 29G X 1/2" 0.5 ML MISC USE AS DIRECTED 300 each 0  . Insulin Syringe-Needle U-100 (INSULIN SYRINGE .5CC/30GX5/16") 30G X 5/16" 0.5 ML MISC USE AS DIRECTED 300 each 0  . Insulin Syringe-Needle U-100 27G X 1/2" 1 ML MISC Use as directed with insulin. One needle per use. 100 each 2  . LANTUS 100 UNIT/ML injection INJECT 15 UNITS UNDER THE SKIN EVERY NIGHT AT BEDTIME 30 mL 0  . levothyroxine (SYNTHROID) 112 MCG tablet TAKE 1 TABLET(112 MCG) BY MOUTH DAILY BEFORE BREAKFAST 90 tablet 2  . losartan (COZAAR) 100 MG tablet Take 1 tablet (100 mg total) by mouth daily. 30 tablet 3  . metFORMIN (GLUCOPHAGE) 500 MG tablet Take 1 tablet  (500 mg total) by mouth daily with breakfast. 90 tablet 2  . ONETOUCH DELICA LANCETS FINE MISC 1 applicator by Other route 4 (four) times daily.  0  . pregabalin (LYRICA) 100 MG capsule Take 1 capsule (100 mg total) by mouth 3 (three) times daily. 270 capsule 1   No current facility-administered medications for this visit.     Past Medical History:  Diagnosis Date  . Allergy   . Bell's palsy   . Diabetes mellitus without complication (HCC)    diet controlled  . Hypertension   . Hypothyroidism   . Kidney stones   . Stroke [facility])     Past Surgical History:  Procedure Laterality Date  . LOOP RECORDER INSERTION N/A 01/27/2018   Procedure: LOOP RECORDER INSERTION;  Surgeon: [name], MD;  Location: [place] [facility];  Service: Cardiovascular;  Laterality: N/A;  . LUMBAR PUNCTURE     as child  . NO PAST SURGERIES    . TEE WITHOUT CARDIOVERSION N/A 01/07/2018   Procedure: TRANSESOPHAGEAL ECHOCARDIOGRAM (TEE);  Surgeon: Minna Merritts, MD;  Location: [facility];  Service: Cardiovascular;  Laterality: N/A;    Social History Social History   Tobacco Use  . Smoking status: Never Smoker  . Smokeless tobacco: Never Used  Substance Use Topics  .  Alcohol use: No    Alcohol/week: 0.0 standard drinks    Comment: very rarely (1-2/year)  . Drug use: No     Family History Family History  Problem Relation Age of Onset  . Breast cancer Mother   . Diabetes Father   . Diabetes Sister   . Arthritis Maternal Grandmother   . Diabetes Maternal Grandmother      No Known Allergies   REVIEW OF SYSTEMS (Negative unless checked)  Constitutional: [] ?Weight loss  [] ?Fever  [] ?Chills Cardiac: [] ?Chest pain   [] ?Chest pressure   [] ?Palpitations   [] ?Shortness of breath when laying flat   [] ?Shortness of breath at rest   [] ?Shortness of breath with exertion. Vascular:  [x] ?Pain in legs with walking   [x] ?Pain in legs at rest   [] ?Pain in legs when laying flat   [] ?Claudication    [] ?Pain in feet when walking  [] ?Pain in feet at rest  [] ?Pain in feet when laying flat   [] ?History of DVT   [] ?Phlebitis   [x] ?Swelling in legs   [] ?Varicose veins   [] ?Non-healing ulcers Pulmonary:   [] ?Uses home oxygen   [] ?Productive cough   [] ?Hemoptysis   [] ?Wheeze  [] ?COPD   [] ?Asthma Neurologic:  [] ?Dizziness  [] ?Blackouts   [] ?Seizures   [x] ?History of stroke   [] ?History of TIA  [] ?Aphasia   [] ?Temporary blindness   [] ?Dysphagia   [] ?Weakness or numbness in arms   [] ?Weakness or numbness in legs Musculoskeletal:  [] ?Arthritis   [] ?Joint swelling   [] ?Joint pain   [] ?Low back pain Hematologic:  [] ?Easy bruising  [] ?Easy bleeding   [] ?Hypercoagulable state   [] ?Anemic  [] ?Hepatitis Gastrointestinal:  [] ?Blood in stool   [] ?Vomiting blood  [] ?Gastroesophageal reflux/heartburn   [] ?Abdominal pain Genitourinary:  [] ?Chronic kidney disease   [] ?Difficult urination  [] ?Frequent urination  [] ?Burning with urination   [] ?Hematuria Skin:  [] ?Rashes   [] ?Ulcers   [] ?Wounds Psychological:  [] ?History of anxiety   [] ? History of major depression.   Physical Examination  BP (!) 211/110 (BP Location: Right Arm)   Pulse 77   Resp 16   Wt 296 lb (134.3 kg)   BMI 40.14 kg/m  Gen:  WD/WN, NAD Head: [place]/AT, No temporalis wasting. Ear/Nose/Throat: Hearing grossly intact, nares w/o erythema or drainage Eyes: Conjunctiva clear. Sclera non-icteric Neck: Supple.  Trachea midline Pulmonary:  Good air movement, no use of accessory muscles.  Cardiac: RRR, no JVD Vascular:  Vessel Right Left  Radial Palpable Palpable                       Musculoskeletal: M/S 5/5 throughout.  No deformity or atrophy. 2+ LLE edema.  In a walking splint on the left foot. Neurologic: Sensation grossly intact in extremities.  Symmetrical.  Speech is fluent.  Psychiatric: Judgment intact, Mood & affect appropriate for pt's clinical situation. Dermatologic: No rashes or ulcers noted.  No cellulitis or open wounds.        Labs Recent Results (from the past 2160 hour(s))  CUP PACEART REMOTE DEVICE CHECK     Status: None   Collection Time: 12/23/18  7:40 PM  Result Value Ref Range   Date Time Interrogation Session [id number]    Pulse Generator Manufacturer [facility]    Pulse Gen Model ASN05 Reveal LINQ    Pulse Gen Serial Number [id number]    Clinic Name [facility]    Implantable Pulse Generator Type ICM/ILR    Implantable Pulse Generator Implant Date [id number]    Eval Rhythm  SR at 85 bpm   CUP PACEART REMOTE DEVICE CHECK     Status: None   Collection Time: 01/25/19  8:09 PM  Result Value Ref Range   Date Time Interrogation Session [id number]    Pulse Generator Manufacturer [employer]    Pulse Gen Model MHD62 Reveal LINQ    Pulse Gen Serial Number [id number]    Clinic Name [facility] Pulse Generator Type ICM/ILR    Implantable Pulse Generator Implant Date [id number]     Radiology No results found.  Assessment/Plan Essential hypertension blood pressure control important in reducing the progression of atherosclerotic disease. On appropriate oral medications.   Diabetes blood glucose control important in reducing the progression of atherosclerotic disease. Also, involved in wound healing. On appropriate medications.   Hyperlipidemia lipid control important in reducing the progression of atherosclerotic disease. Continue statin therapy  Swelling of left lower extremity Has been diagnosed with fractures in the left foot that would explain the left lower extremity swelling.  He is now in a walking boot.  Swelling is reasonably stable.  We will give this several months to heal to determine how much the swelling goes down.  No evidence of venous disease but could certainly developed lymphedema.  Plan to follow-up in 3 to 4 months.    Leotis Pain, MD  03/05/2019 11:57 AM    This note was created with Dragon medical transcription system.  Any errors from dictation  are purely unintentional

## 2019-03-30 ENCOUNTER — Other Ambulatory Visit: Payer: Self-pay

## 2019-03-30 MED ORDER — ASPIRIN 81 MG PO TBEC: DELAYED_RELEASE_TABLET | ORAL | 0 refills | Status: DC

## 2019-04-02 ENCOUNTER — Ambulatory Visit (INDEPENDENT_AMBULATORY_CARE_PROVIDER_SITE_OTHER): Payer: Managed Care, Other (non HMO) | Admitting: *Deleted

## 2019-04-02 ENCOUNTER — Other Ambulatory Visit: Payer: Self-pay | Admitting: Internal Medicine

## 2019-04-02 DIAGNOSIS — I639 Cerebral infarction, unspecified: Secondary | ICD-10-CM | POA: Diagnosis not present

## 2019-04-02 LAB — CUP PACEART REMOTE DEVICE CHECK
Date Time Interrogation Session: 20200806214010
Implantable Pulse Generator Implant Date: 20190604

## 2019-04-02 NOTE — Telephone Encounter (Signed)
Pt due for f/u since 01/2019 . Please contact pt for future appointment. Pt needing refills.

## 2019-04-06 ENCOUNTER — Other Ambulatory Visit: Payer: Self-pay | Admitting: *Deleted

## 2019-04-06 NOTE — Progress Notes (Signed)
[facility] Summary Report / Loop Recorder 

## 2019-04-06 NOTE — Telephone Encounter (Signed)
Pt overdue for follow up with Dr.Klein. Please contact pt for future appointment last seen 5/19.

## 2019-04-08 ENCOUNTER — Other Ambulatory Visit: Payer: Self-pay

## 2019-04-12 ENCOUNTER — Other Ambulatory Visit: Payer: Self-pay

## 2019-04-12 ENCOUNTER — Ambulatory Visit: Payer: Managed Care, Other (non HMO) | Admitting: Internal Medicine

## 2019-04-12 ENCOUNTER — Encounter: Payer: Self-pay | Admitting: Internal Medicine

## 2019-04-12 VITALS — BP 132/78 | HR 75 | Temp 97.3°F | Resp 16 | Ht 72.0 in | Wt 299.0 lb

## 2019-04-12 DIAGNOSIS — I10 Essential (primary) hypertension: Secondary | ICD-10-CM

## 2019-04-12 DIAGNOSIS — R400 Somnolence: Secondary | ICD-10-CM | POA: Diagnosis not present

## 2019-04-12 DIAGNOSIS — Z794 Long term (current) use of insulin: Secondary | ICD-10-CM

## 2019-04-12 DIAGNOSIS — Z125 Encounter for screening for malignant neoplasm of prostate: Secondary | ICD-10-CM

## 2019-04-12 DIAGNOSIS — E11319 Type 2 diabetes mellitus with unspecified diabetic retinopathy without macular edema: Secondary | ICD-10-CM

## 2019-04-12 DIAGNOSIS — E785 Hyperlipidemia, unspecified: Secondary | ICD-10-CM

## 2019-04-12 DIAGNOSIS — Z8673 Personal history of transient ischemic attack (TIA), and cerebral infarction without residual deficits: Secondary | ICD-10-CM

## 2019-04-12 DIAGNOSIS — E039 Hypothyroidism, unspecified: Secondary | ICD-10-CM

## 2019-04-12 DIAGNOSIS — D649 Anemia, unspecified: Secondary | ICD-10-CM

## 2019-04-12 DIAGNOSIS — M7989 Other specified soft tissue disorders: Secondary | ICD-10-CM

## 2019-04-12 MED ORDER — ATORVASTATIN CALCIUM 80 MG PO TABS: 80.0000 mg | ORAL_TABLET | Freq: Every day | ORAL | 0 refills | Status: DC

## 2019-04-12 MED ORDER — AMLODIPINE BESYLATE 5 MG PO TABS: 5.0000 mg | ORAL_TABLET | Freq: Every day | ORAL | 1 refills | Status: DC

## 2019-04-12 NOTE — Progress Notes (Addendum)
Patient ID: Gregory Crane, male   DOB: 01/28/1971, 48 y.o.   MRN: [id number]   Subjective:    Patient ID: Gregory Crane, male    DOB: 07-22-1971, 48 y.o.   MRN: [id number]  HPI  Patient here for a scheduled follow up.  He is in the office.  Wife on virtual visit with him.  Seeing podiatry for two fractures in left foot.  Increased swelling related to this.  Has a walking boot.  Wears daily.  His work requires him to be up on his feet a lot.  Fractures - not healing well.  Insurance did not cover bone stimulator.  He feels the healing is slowed by having to be on his feet a lot.  Has seen vascular surgery for swelling.  Has f/u planned.  Previus CT head and neck revealed disproportionate atherosclerosis of the left ICA and moderate stenosis of left ICA siphon.  Also noted severe stenosis at the left ACA origin and moderated stenosis of the right PCA P2 segment.  On aspirin and plavix.  Stable on this regimen.  No chest pain.  Increased fatigue.  When comes home from work, falls asleep easily.  No increased cough or congestion.  Blood pressure elevated.  Not checking sugars.  Not exercising.  Limited by the foot fractures.  No abdominal pain.  Bowels moving.     Past Medical History:  Diagnosis Date   Allergy    Bell's palsy    Diabetes mellitus without complication ([place])    diet controlled   Hypertension    Hypothyroidism    Kidney stones    Stroke [facility])    Past Surgical History:  Procedure Laterality Date   LOOP RECORDER INSERTION N/A 01/27/2018   Procedure: LOOP RECORDER INSERTION;  Surgeon: [name], MD;  Location: [place] [facility];  Service: Cardiovascular;  Laterality: N/A;   LUMBAR PUNCTURE     as child   NO PAST SURGERIES     TEE WITHOUT CARDIOVERSION N/A 01/07/2018   Procedure: TRANSESOPHAGEAL ECHOCARDIOGRAM (TEE);  Surgeon: Minna Merritts, MD;  Location: [facility];  Service: Cardiovascular;  Laterality: N/A;   Family History  Problem Relation Age of  Onset   Breast cancer Mother    Diabetes Father    Diabetes Sister    Arthritis Maternal Grandmother    Diabetes Maternal Grandmother    Social History   Socioeconomic History   Marital status: Married    Spouse name: [name]   Number of children: 2   Years of education: Not on file   Highest education level: Not on file  Occupational History   Not on file  Social Needs   Financial resource strain: Not hard at all   Food insecurity    Worry: Never true    Inability: Never true   Transportation needs    Medical: No    Non-medical: No  Tobacco Use   Smoking status: Never Smoker   Smokeless tobacco: Never Used  Substance and Sexual Activity   Alcohol use: No    Alcohol/week: 0.0 standard drinks    Comment: very rarely (1-2/year)   Drug use: No   Sexual activity: Yes  Lifestyle   Physical activity    Days per week: 0 days    Minutes per session: 0 min   Stress: To some extent  Relationships   Social connections    Talks on phone: More than three times a week    Gets together: More than  three times a week    Attends religious service: More than 4 times per year    Active member of club or organization: No    Attends meetings of clubs or organizations: Never    Relationship status: Married  Other Topics Concern   Not on file  Social History Narrative   Lives at home with wife, independent at baseline.    Outpatient Encounter Medications as of 04/12/2019  Medication Sig   amLODipine (NORVASC) 5 MG tablet Take 1 tablet (5 mg total) by mouth daily.   aspirin (ASPIRIN LOW DOSE) 81 MG EC tablet TAKE 1 TABLET(81 MG) BY MOUTH DAILY   clopidogrel (PLAVIX) 75 MG tablet TAKE 1 TABLET(75 MG) BY MOUTH DAILY   DULoxetine (CYMBALTA) 20 MG capsule Take 20 mg by mouth daily.    glucose blood (FREESTYLE LITE) test strip Check blood sugars twice a day (Dx. 250.02)   HUMALOG 100 UNIT/ML injection INJECT 4 TO 6 UNITS UNDER THE SKIN THREE TIMES DAILY    INSULIN SYRINGE .5CC/29G 29G X 1/2" 0.5 ML MISC USE AS DIRECTED   Insulin Syringe-Needle U-100 (INSULIN SYRINGE .5CC/30GX5/16") 30G X 5/16" 0.5 ML MISC USE AS DIRECTED   Insulin Syringe-Needle U-100 27G X 1/2" 1 ML MISC Use as directed with insulin. One needle per use.   LANTUS 100 UNIT/ML injection INJECT 15 UNITS UNDER THE SKIN EVERY NIGHT AT BEDTIME   levothyroxine (SYNTHROID) 112 MCG tablet TAKE 1 TABLET(112 MCG) BY MOUTH DAILY BEFORE BREAKFAST   metFORMIN (GLUCOPHAGE) 500 MG tablet Take 1 tablet (500 mg total) by mouth daily with breakfast.   ONETOUCH DELICA LANCETS FINE MISC 1 applicator by Other route 4 (four) times daily.   pregabalin (LYRICA) 100 MG capsule Take 1 capsule (100 mg total) by mouth 3 (three) times daily.   [DISCONTINUED] atorvastatin (LIPITOR) 80 MG tablet TAKE 1 TABLET BY MOUTH DAILY   [DISCONTINUED] losartan (COZAAR) 100 MG tablet Take 1 tablet (100 mg total) by mouth daily.   No facility-administered encounter medications on file as of 04/12/2019.     Review of Systems  Constitutional: Positive for fatigue. Negative for appetite change.  HENT: Negative for congestion and sinus pressure.   Respiratory: Negative for cough, chest tightness and shortness of breath.   Cardiovascular: Positive for leg swelling. Negative for chest pain and palpitations.  Gastrointestinal: Negative for abdominal pain, diarrhea, nausea and vomiting.  Genitourinary: Negative for difficulty urinating and dysuria.  Musculoskeletal: Negative for myalgias.       Foot pain - swelling.    Skin: Negative for color change and rash.  Neurological: Negative for dizziness, light-headedness and headaches.  Psychiatric/Behavioral: Negative for agitation and dysphoric mood.       Objective:    Physical Exam Constitutional:      General: He is in acute distress.     Appearance: Normal appearance. He is well-developed.  HENT:     Right Ear: External ear normal.     Left Ear: External ear  normal.     Mouth/Throat:     Pharynx: Oropharynx is clear.  Eyes:     General: No scleral icterus.       Right eye: No discharge.        Left eye: No discharge.     Conjunctiva/sclera: Conjunctivae normal.  Neck:     Musculoskeletal: Neck supple. No muscular tenderness.  Cardiovascular:     Rate and Rhythm: Normal rate and regular rhythm.  Pulmonary:     Effort: Pulmonary effort is  normal. No respiratory distress.     Breath sounds: Normal breath sounds.  Abdominal:     General: Bowel sounds are normal.     Palpations: Abdomen is soft.     Tenderness: There is no abdominal tenderness.  Musculoskeletal:        General: Swelling present.     Comments: Boot in place - left.  Did not take off stating hard to get back on.  Is followed closely by podiatry.    Lymphadenopathy:     Cervical: No cervical adenopathy.  Skin:    Findings: No erythema or rash.  Neurological:     Mental Status: He is alert.  Psychiatric:        Mood and Affect: Mood normal.        Behavior: Behavior normal.     BP 132/78    Pulse 75    Temp (!) 97.3 F (36.3 C) (Temporal)    Resp 16    Ht 6' (1.829 m)    Wt 299 lb (135.6 kg)    SpO2 98%    BMI 40.55 kg/m  Wt Readings from Last 3 Encounters:  04/12/19 299 lb (135.6 kg)  03/05/19 296 lb (134.3 kg)  01/15/19 295 lb (133.8 kg)     Lab Results  Component Value Date   WBC 5.7 10/08/2018   HGB 11.2 (L) 10/08/2018   HCT 33.7 (L) 10/08/2018   PLT 221 10/08/2018   GLUCOSE 188 (H) 10/08/2018   CHOL 103 04/10/2018   TRIG 113 04/10/2018   HDL 29 (L) 04/10/2018   LDLCALC 51 04/10/2018   ALT 13 10/06/2018   AST 15 10/06/2018   NA 138 10/08/2018   K 4.1 10/08/2018   CL 106 10/08/2018   CREATININE 1.17 10/08/2018   BUN 19 10/08/2018   CO2 29 10/08/2018   TSH 7.970 (H) 03/25/2017   INR 0.85 04/09/2018   HGBA1C 8.0 (H) 10/06/2018    US Venous Img Lower Unilateral Left  Result Date: 12/29/2018 CLINICAL DATA:  48 year old male with swelling EXAM:  LEFT LOWER EXTREMITY VENOUS DOPPLER ULTRASOUND TECHNIQUE: Gray-scale sonography with graded compression, as well as color Doppler and duplex ultrasound were performed to evaluate the lower extremity deep venous systems from the level of the common femoral vein and including the common femoral, femoral, profunda femoral, popliteal and calf veins including the posterior tibial, peroneal and gastrocnemius veins when visible. The superficial great saphenous vein was also interrogated. Spectral Doppler was utilized to evaluate flow at rest and with distal augmentation maneuvers in the common femoral, femoral and popliteal veins. COMPARISON:  None. FINDINGS: Contralateral Common Femoral Vein: Respiratory phasicity is normal and symmetric with the symptomatic side. No evidence of thrombus. Normal compressibility. Common Femoral Vein: No evidence of thrombus. Normal compressibility, respiratory phasicity and response to augmentation. Saphenofemoral Junction: No evidence of thrombus. Normal compressibility and flow on color Doppler imaging. Profunda Femoral Vein: No evidence of thrombus. Normal compressibility and flow on color Doppler imaging. Femoral Vein: No evidence of thrombus. Normal compressibility, respiratory phasicity and response to augmentation. Popliteal Vein: No evidence of thrombus. Normal compressibility, respiratory phasicity and response to augmentation. Calf Veins: No evidence of thrombus. Normal compressibility and flow on color Doppler imaging. Superficial Great Saphenous Vein: No evidence of thrombus. Normal compressibility and flow on color Doppler imaging. Other Findings: Lymph nodes of the inguinal region, with typical architecture maintained. Edema IMPRESSION: Sonographic survey of the left lower extremity negative for DVT. Edema. Inguinal lymph nodes, likely reactive, with  typical architecture maintained Electronically Signed   By: Corrie Mckusick D.O.   On: 12/29/2018 10:36       Assessment &  Plan:   Problem List Items Addressed This Visit    Anemia    hgb slightly decreased last check.  Recheck cbc, B12 and ferritin.        Relevant Orders   CBC with Differential/Platelet   Ferritin   Vitamin B12   Daytime somnolence    Fatigue and daytime somnolence.  Have discussed with him on multiple occasions regarding evaluation for sleep apnea.  Have placed referral.  He has not followed through with scheduling the appt.  Discussed again with him today.  He is agreeable now for referral.        Relevant Orders   Ambulatory referral to Pulmonology   Diabetes [facility])    Unclear how his sugars are doing.  He is not checking his sugars.  Not watching his diet and not exercising.  Discussed importance of checking sugars, low carb diet and exercise.  Also discussed importance of good control of his sugars.  Follow met b and a1c.        Relevant Orders   Hemoglobin F0H   Basic metabolic panel   Microalbumin / creatinine urine ratio   Essential hypertension    Blood pressure elevated.  On losartan.  Add amlodipine.  Discussed monitoring for any worsening swelling.  Follow pressures.  Follow metabolic panel.       Relevant Medications   amLODipine (NORVASC) 5 MG tablet   Other Relevant Orders   TSH   History of CVA (cerebrovascular accident)    Has had extensive w/up as outlined previously.  On aspirin and plavix.  No recurring symptoms.  Did have CTA head and neck as outlined.  Has f/u with [facility] scheduled for f/u of lower extremity swelling.  Will confirm no further imaging warranted regarding CTA findings.        Hyperlipidemia    Low cholesterol diet and exercise.  On atorvastatin.  Follow lipid panel and liver function tests.        Relevant Medications   amLODipine (NORVASC) 5 MG tablet   Other Relevant Orders   Hepatic function panel   Lipid panel   Hypothyroidism    On thyroid replacement.  Follow tsh.       Swelling of left lower extremity    With two fractures -  left foot.  Increased swelling.  Followed by podiatry.  Also has f/u with [facility].  Stable.  Boot in place.         Other Visit Diagnoses    Prostate cancer screening    -  Primary   Relevant Orders   PSA       [name], MD

## 2019-04-16 ENCOUNTER — Other Ambulatory Visit: Payer: Self-pay

## 2019-04-16 ENCOUNTER — Encounter: Payer: Self-pay | Admitting: Internal Medicine

## 2019-04-16 MED ORDER — LOSARTAN POTASSIUM 100 MG PO TABS: 100.0000 mg | ORAL_TABLET | Freq: Every day | ORAL | 3 refills | Status: DC

## 2019-04-17 ENCOUNTER — Encounter: Payer: Self-pay | Admitting: Internal Medicine

## 2019-04-17 DIAGNOSIS — D649 Anemia, unspecified: Secondary | ICD-10-CM | POA: Insufficient documentation

## 2019-04-17 NOTE — Assessment & Plan Note (Signed)
Low cholesterol diet and exercise.  On atorvastatin.  Follow lipid panel and liver function tests.

## 2019-04-17 NOTE — Assessment & Plan Note (Signed)
Fatigue and daytime somnolence.  Have discussed with him on multiple occasions regarding evaluation for sleep apnea.  Have placed referral.  He has not followed through with scheduling the appt.  Discussed again with him today.  He is agreeable now for referral.

## 2019-04-17 NOTE — Assessment & Plan Note (Addendum)
Has had extensive w/up as outlined previously.  On aspirin and plavix.  No recurring symptoms.  Did have CTA head and neck as outlined.  Has f/u with [facility] scheduled for f/u of lower extremity swelling.  Will confirm no further imaging warranted regarding CTA findings.

## 2019-04-17 NOTE — Assessment & Plan Note (Signed)
Blood pressure elevated.  On losartan.  Add amlodipine.  Discussed monitoring for any worsening swelling.  Follow pressures.  Follow metabolic panel.

## 2019-04-17 NOTE — Assessment & Plan Note (Signed)
Unclear how his sugars are doing.  He is not checking his sugars.  Not watching his diet and not exercising.  Discussed importance of checking sugars, low carb diet and exercise.  Also discussed importance of good control of his sugars.  Follow met b and a1c.   

## 2019-04-17 NOTE — Assessment & Plan Note (Signed)
hgb slightly decreased last check.  Recheck cbc, B12 and ferritin.

## 2019-04-17 NOTE — Assessment & Plan Note (Signed)
On thyroid replacement.  Follow tsh.  

## 2019-04-17 NOTE — Assessment & Plan Note (Signed)
With two fractures - left foot.  Increased swelling.  Followed by podiatry.  Also has f/u with [facility].  Stable.  Boot in place.

## 2019-04-28 MED ORDER — ATORVASTATIN CALCIUM 80 MG PO TABS: 80.0000 mg | ORAL_TABLET | Freq: Every day | ORAL | 0 refills | Status: DC

## 2019-04-28 NOTE — Telephone Encounter (Signed)
Scheduled

## 2019-05-04 ENCOUNTER — Encounter: Payer: Managed Care, Other (non HMO) | Admitting: Internal Medicine

## 2019-05-04 NOTE — Progress Notes (Deleted)
      Patient Care Team: [name], MD as PCP - General (Internal Medicine)   HPI  Gregory Crane is a 48 y.o. male seen in followup for loop recorder, LINQ, implanted following CVA 5/19. Imaging demonstrated severe stenosis of L ACA.  MRI >> acute putamen infarct    DATE TEST EF   1/19 Echo   60-65 % LVH mild  5/19 TEE  55-60 %             01/05/18 with fogginess and L facial droop.  They first developed symptoms while at work.  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  Lab work is reviewed   Records and Results Reviewed***  Past Medical History:  Diagnosis Date  . Allergy   . Bell's palsy   . Diabetes mellitus without complication ([facility])    diet controlled  . Hypertension   . Hypothyroidism   . Kidney stones   . Stroke [facility])     Past Surgical History:  Procedure Laterality Date  . LOOP RECORDER INSERTION N/A 01/27/2018   Procedure: LOOP RECORDER INSERTION;  Surgeon: [name], MD;  Location: [place] [facility];  Service: Cardiovascular;  Laterality: N/A;  . LUMBAR PUNCTURE     as child  . NO PAST SURGERIES    . TEE WITHOUT CARDIOVERSION N/A 01/07/2018   Procedure: TRANSESOPHAGEAL ECHOCARDIOGRAM (TEE);  Surgeon: Minna Merritts, MD;  Location: [facility];  Service: Cardiovascular;  Laterality: N/A;    No outpatient medications have been marked as taking for the 05/04/19 encounter (Appointment) with [name], MD.    No Known Allergies    Review of Systems negative except from HPI and PMH  Physical Exam There were no vitals taken for this visit. 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  The patient does not*** have concerns regarding medicines.

## 2019-05-05 ENCOUNTER — Ambulatory Visit (INDEPENDENT_AMBULATORY_CARE_PROVIDER_SITE_OTHER): Payer: Managed Care, Other (non HMO) | Admitting: *Deleted

## 2019-05-05 DIAGNOSIS — I639 Cerebral infarction, unspecified: Secondary | ICD-10-CM

## 2019-05-06 LAB — CUP PACEART REMOTE DEVICE CHECK
Date Time Interrogation Session: 20200908220937
Implantable Pulse Generator Implant Date: 20190604

## 2019-05-20 ENCOUNTER — Institutional Professional Consult (permissible substitution): Payer: Managed Care, Other (non HMO) | Admitting: Internal Medicine

## 2019-05-20 NOTE — Progress Notes (Signed)
[facility] Summary Report / Loop Recorder 

## 2019-05-24 ENCOUNTER — Other Ambulatory Visit: Payer: Self-pay

## 2019-05-26 ENCOUNTER — Encounter: Payer: Self-pay | Admitting: Internal Medicine

## 2019-05-26 ENCOUNTER — Other Ambulatory Visit: Payer: Self-pay

## 2019-05-26 ENCOUNTER — Ambulatory Visit (INDEPENDENT_AMBULATORY_CARE_PROVIDER_SITE_OTHER): Payer: Managed Care, Other (non HMO) | Admitting: Internal Medicine

## 2019-05-26 VITALS — BP 168/92 | HR 89 | Temp 97.4°F | Resp 16 | Ht 72.0 in | Wt 311.0 lb

## 2019-05-26 DIAGNOSIS — R400 Somnolence: Secondary | ICD-10-CM

## 2019-05-26 DIAGNOSIS — I10 Essential (primary) hypertension: Secondary | ICD-10-CM

## 2019-05-26 DIAGNOSIS — D649 Anemia, unspecified: Secondary | ICD-10-CM | POA: Diagnosis not present

## 2019-05-26 DIAGNOSIS — M7989 Other specified soft tissue disorders: Secondary | ICD-10-CM

## 2019-05-26 DIAGNOSIS — E785 Hyperlipidemia, unspecified: Secondary | ICD-10-CM

## 2019-05-26 DIAGNOSIS — I6523 Occlusion and stenosis of bilateral carotid arteries: Secondary | ICD-10-CM

## 2019-05-26 DIAGNOSIS — R809 Proteinuria, unspecified: Secondary | ICD-10-CM

## 2019-05-26 DIAGNOSIS — E11319 Type 2 diabetes mellitus with unspecified diabetic retinopathy without macular edema: Secondary | ICD-10-CM

## 2019-05-26 DIAGNOSIS — E039 Hypothyroidism, unspecified: Secondary | ICD-10-CM

## 2019-05-26 DIAGNOSIS — E538 Deficiency of other specified B group vitamins: Secondary | ICD-10-CM

## 2019-05-26 DIAGNOSIS — Z125 Encounter for screening for malignant neoplasm of prostate: Secondary | ICD-10-CM

## 2019-05-26 DIAGNOSIS — Z794 Long term (current) use of insulin: Secondary | ICD-10-CM

## 2019-05-26 DIAGNOSIS — Z8673 Personal history of transient ischemic attack (TIA), and cerebral infarction without residual deficits: Secondary | ICD-10-CM

## 2019-05-26 MED ORDER — CHLORTHALIDONE 25 MG PO TABS: 25.0000 mg | ORAL_TABLET | Freq: Every day | ORAL | 1 refills | Status: DC

## 2019-05-26 NOTE — Progress Notes (Signed)
Patient ID: Gregory Crane, male   DOB: 07-24-1971, 48 y.o.   MRN: [id number]   Subjective:    Patient ID: Isiah Scheel, male    DOB: 08-09-1971, 48 y.o.   MRN: [id number]  HPI  Patient here for a scheduled follow up.  Increased weight.  Increased lower extremity swelling.  Not watching his diet.  No exercising.  No checking sugars.  No chest pain.  Breathing stable.  Seeing podiatry for left foot fracture.  Out of boot today.  Had f/u and was told did not have to wear.  Has seen vascular surgery.  Taking aspirin and plavix.  Still with fatigue and daytime somnolence.  Again discussed with him the need for sleep study.  Have tried to schedule.  Pulmonary has contacted him.  He plans to get this scheduled.  I again discussed with him the importance of treating sleep apnea and risk of untreated sleep apnea.  Blood pressure still elevated.  Added amlodipine previously.  Does not feel the swelling is any different with the addition of amlodipine.  Needs labs.  Still has not had drawn.     Past Medical History:  Diagnosis Date  . Allergy   . Bell's palsy   . Diabetes mellitus without complication (HCC)    diet controlled  . Hypertension   . Hypothyroidism   . Kidney stones   . Stroke [facility])    Past Surgical History:  Procedure Laterality Date  . LOOP RECORDER INSERTION N/A 01/27/2018   Procedure: LOOP RECORDER INSERTION;  Surgeon: [name], MD;  Location: [place] [facility];  Service: Cardiovascular;  Laterality: N/A;  . LUMBAR PUNCTURE     as child  . NO PAST SURGERIES    . TEE WITHOUT CARDIOVERSION N/A 01/07/2018   Procedure: TRANSESOPHAGEAL ECHOCARDIOGRAM (TEE);  Surgeon: Minna Merritts, MD;  Location: [facility];  Service: Cardiovascular;  Laterality: N/A;   Family History  Problem Relation Age of Onset  . Breast cancer Mother   . Diabetes Father   . Diabetes Sister   . Arthritis Maternal Grandmother   . Diabetes Maternal Grandmother    Social History   Socioeconomic  History  . Marital status: Married    Spouse name: [name]  . Number of children: 2  . Years of education: Not on file  . Highest education level: Not on file  Occupational History  . Not on file  Social Needs  . Financial resource strain: Not hard at all  . Food insecurity    Worry: Never true    Inability: Never true  . Transportation needs    Medical: No    Non-medical: No  Tobacco Use  . Smoking status: Never Smoker  . Smokeless tobacco: Never Used  Substance and Sexual Activity  . Alcohol use: No    Alcohol/week: 0.0 standard drinks    Comment: very rarely (1-2/year)  . Drug use: No  . Sexual activity: Yes  Lifestyle  . Physical activity    Days per week: 0 days    Minutes per session: 0 min  . Stress: To some extent  Relationships  . Social connections    Talks on phone: More than three times a week    Gets together: More than three times a week    Attends religious service: More than 4 times per year    Active member of club or organization: No    Attends meetings of clubs or organizations: Never    Relationship  status: Married  Other Topics Concern  . Not on file  Social History Narrative   Lives at home with wife, independent at baseline.    Outpatient Encounter Medications as of 05/26/2019  Medication Sig  . amLODipine (NORVASC) 5 MG tablet Take 1 tablet (5 mg total) by mouth daily.  Marland Kitchen aspirin (ASPIRIN LOW DOSE) 81 MG EC tablet TAKE 1 TABLET(81 MG) BY MOUTH DAILY  . atorvastatin (LIPITOR) 80 MG tablet Take 1 tablet (80 mg total) by mouth daily.  . chlorthalidone (HYGROTON) 25 MG tablet Take 1 tablet (25 mg total) by mouth daily.  . clopidogrel (PLAVIX) 75 MG tablet TAKE 1 TABLET(75 MG) BY MOUTH DAILY  . DULoxetine (CYMBALTA) 20 MG capsule Take 20 mg by mouth daily.   Marland Kitchen glucose blood (FREESTYLE LITE) test strip Check blood sugars twice a day (Dx. 250.02)  . HUMALOG 100 UNIT/ML injection INJECT 4 TO 6 UNITS UNDER THE SKIN THREE TIMES DAILY  . INSULIN  SYRINGE .5CC/29G 29G X 1/2" 0.5 ML MISC USE AS DIRECTED  . Insulin Syringe-Needle U-100 (INSULIN SYRINGE .5CC/30GX5/16") 30G X 5/16" 0.5 ML MISC USE AS DIRECTED  . Insulin Syringe-Needle U-100 27G X 1/2" 1 ML MISC Use as directed with insulin. One needle per use.  Marland Kitchen LANTUS 100 UNIT/ML injection INJECT 15 UNITS UNDER THE SKIN EVERY NIGHT AT BEDTIME  . levothyroxine (SYNTHROID) 112 MCG tablet TAKE 1 TABLET(112 MCG) BY MOUTH DAILY BEFORE BREAKFAST  . losartan (COZAAR) 100 MG tablet Take 1 tablet (100 mg total) by mouth daily.  . metFORMIN (GLUCOPHAGE) 500 MG tablet Take 1 tablet (500 mg total) by mouth daily with breakfast.  . ONETOUCH DELICA LANCETS FINE MISC 1 applicator by Other route 4 (four) times daily.  . pregabalin (LYRICA) 100 MG capsule Take 1 capsule (100 mg total) by mouth 3 (three) times daily.  . [DISCONTINUED] atorvastatin (LIPITOR) 80 MG tablet Take 1 tablet (80 mg total) by mouth daily.   No facility-administered encounter medications on file as of 05/26/2019.    Review of Systems  Constitutional: Negative for appetite change.       Increased weight.  Some fatigue.   HENT: Negative for congestion and sinus pressure.   Respiratory: Negative for cough and chest tightness.        Breathing stable.  No increased sob.    Cardiovascular: Positive for leg swelling. Negative for chest pain and palpitations.  Gastrointestinal: Negative for abdominal pain, diarrhea, nausea and vomiting.  Genitourinary: Negative for difficulty urinating and dysuria.  Musculoskeletal: Negative for joint swelling and myalgias.  Skin: Negative for color change and wound.  Neurological: Negative for dizziness, light-headedness and headaches.  Psychiatric/Behavioral: Negative for agitation and dysphoric mood.       Objective:    Physical Exam Constitutional:      General: He is not in acute distress.    Appearance: Normal appearance. He is well-developed.  HENT:     Head: Normocephalic and  atraumatic.     Right Ear: External ear normal.     Left Ear: External ear normal.  Eyes:     General: No scleral icterus.       Right eye: No discharge.        Left eye: No discharge.     Conjunctiva/sclera: Conjunctivae normal.  Neck:     Musculoskeletal: Neck supple. No muscular tenderness.  Cardiovascular:     Rate and Rhythm: Normal rate and regular rhythm.  Pulmonary:     Effort: Pulmonary effort is normal.  No respiratory distress.     Breath sounds: Normal breath sounds.  Abdominal:     General: Bowel sounds are normal.     Palpations: Abdomen is soft.     Tenderness: There is no abdominal tenderness.  Musculoskeletal:     Comments: Increased lower extremity edema and pedal edema.    Lymphadenopathy:     Cervical: No cervical adenopathy.  Skin:    Coloration: Skin is not jaundiced.     Findings: No erythema.  Neurological:     Mental Status: He is alert.  Psychiatric:        Mood and Affect: Mood normal.        Behavior: Behavior normal.     BP (!) 168/92   Pulse 89   Temp (!) 97.4 F (36.3 C)   Resp 16   Ht 6' (1.829 m)   Wt (!) 311 lb (141.1 kg)   SpO2 97%   BMI 42.18 kg/m  Wt Readings from Last 3 Encounters:  05/26/19 (!) 311 lb (141.1 kg)  04/12/19 299 lb (135.6 kg)  03/05/19 296 lb (134.3 kg)     Lab Results  Component Value Date   WBC 6.0 05/26/2019   HGB 10.7 (L) 05/26/2019   HCT 31.0 (L) 05/26/2019   PLT 199 05/26/2019   GLUCOSE 290 (H) 05/26/2019   CHOL 148 05/26/2019   TRIG 152 (H) 05/26/2019   HDL 34 (L) 05/26/2019   LDLCALC 87 05/26/2019   ALT 11 05/26/2019   AST 13 05/26/2019   NA 142 05/26/2019   K 4.0 05/26/2019   CL 106 05/26/2019   CREATININE 1.65 (H) 05/26/2019   BUN 27 (H) 05/26/2019   CO2 23 05/26/2019   TSH 13.300 (H) 05/26/2019   INR 0.85 04/09/2018   HGBA1C 8.5 (H) 05/26/2019    US Venous Img Lower Unilateral Left  Result Date: 12/29/2018 CLINICAL DATA:  48 year old male with swelling EXAM: LEFT LOWER EXTREMITY  VENOUS DOPPLER ULTRASOUND TECHNIQUE: Gray-scale sonography with graded compression, as well as color Doppler and duplex ultrasound were performed to evaluate the lower extremity deep venous systems from the level of the common femoral vein and including the common femoral, femoral, profunda femoral, popliteal and calf veins including the posterior tibial, peroneal and gastrocnemius veins when visible. The superficial great saphenous vein was also interrogated. Spectral Doppler was utilized to evaluate flow at rest and with distal augmentation maneuvers in the common femoral, femoral and popliteal veins. COMPARISON:  None. FINDINGS: Contralateral Common Femoral Vein: Respiratory phasicity is normal and symmetric with the symptomatic side. No evidence of thrombus. Normal compressibility. Common Femoral Vein: No evidence of thrombus. Normal compressibility, respiratory phasicity and response to augmentation. Saphenofemoral Junction: No evidence of thrombus. Normal compressibility and flow on color Doppler imaging. Profunda Femoral Vein: No evidence of thrombus. Normal compressibility and flow on color Doppler imaging. Femoral Vein: No evidence of thrombus. Normal compressibility, respiratory phasicity and response to augmentation. Popliteal Vein: No evidence of thrombus. Normal compressibility, respiratory phasicity and response to augmentation. Calf Veins: No evidence of thrombus. Normal compressibility and flow on color Doppler imaging. Superficial Great Saphenous Vein: No evidence of thrombus. Normal compressibility and flow on color Doppler imaging. Other Findings: Lymph nodes of the inguinal region, with typical architecture maintained. Edema IMPRESSION: Sonographic survey of the left lower extremity negative for DVT. Edema. Inguinal lymph nodes, likely reactive, with typical architecture maintained Electronically Signed   By: Corrie Mckusick D.O.   On: 12/29/2018 10:36  Assessment & Plan:   Problem List  Items Addressed This Visit    Anemia - Primary    Recheck cbc.        Relevant Orders   CBC with Differential/Platelet (Completed)   Ferritin (Completed)   B12 deficiency    Recheck B12 today.        Relevant Orders   Vitamin B12 (Completed)   Carotid stenosis    CTA 12/2017 revealed stenosis - carotid.  Discussed f/u with vascular surgery for f/u.        Relevant Medications   chlorthalidone (HYGROTON) 25 MG tablet   Other Relevant Orders   Ambulatory referral to Vascular Surgery   Daytime somnolence    Increased fatigue and daytime somnolence.  Discussed the need for sleep study.  Have tried to schedule.  He has not followed through with scheduling.  He will contact pulmonary.        Diabetes ([place])    Not watching his diet.  Not checking sugars.  No exercising.  Has gained weight.  Discussed diet and exercise.  Follow met b and a1c.        Relevant Orders   Hemoglobin A1c (Completed)   Basic metabolic panel (Completed)   Microalbumin / creatinine urine ratio (Completed)   Essential hypertension    Blood pressure elevated.  Add chlorthalidone.  Follow met b.        Relevant Medications   chlorthalidone (HYGROTON) 25 MG tablet   History of CVA (cerebrovascular accident)    Has had extensive w/up as outlined previously.  On aspirin and plavix.  Did have CTA head and neck as outlined.  Needs f/u with [facility].        Hyperlipidemia    Low carb diet and exercise.  Continue lipitor.  Follow lipid panel and liver function tests.       Relevant Medications   chlorthalidone (HYGROTON) 25 MG tablet   Other Relevant Orders   Hepatic function panel (Completed)   Lipid panel (Completed)   Hypothyroidism    On thyroid replacement.  Follow tsh.       Relevant Orders   TSH (Completed)   Microalbuminuria    Continue losartan.       Swelling of left lower extremity    Increased swelling.  Not watching diet.  Has seen [facility].  Treat elevated blood pressure with addition of  chlorthalidone.  Check metabolic panel and liver function tests.  Refer back to [facility]. Discussed leg elevation.        Relevant Orders   Ambulatory referral to Vascular Surgery    Other Visit Diagnoses    Prostate cancer screening       Relevant Orders   PSA (Completed)       [name], MD

## 2019-05-27 LAB — CBC WITH DIFFERENTIAL/PLATELET
Basophils Absolute: 0 10*3/uL (ref 0.0–0.2)
Basos: 1 %
EOS (ABSOLUTE): 0.1 10*3/uL (ref 0.0–0.4)
Eos: 1 %
Hematocrit: 31 % — ABNORMAL LOW (ref 37.5–51.0)
Hemoglobin: 10.7 g/dL — ABNORMAL LOW (ref 13.0–17.7)
Immature Grans (Abs): 0 10*3/uL (ref 0.0–0.1)
Immature Granulocytes: 0 %
Lymphocytes Absolute: 1 10*3/uL (ref 0.7–3.1)
Lymphs: 17 %
MCH: 29.1 pg (ref 26.6–33.0)
MCHC: 34.5 g/dL (ref 31.5–35.7)
MCV: 84 fL (ref 79–97)
Monocytes Absolute: 0.4 10*3/uL (ref 0.1–0.9)
Monocytes: 7 %
Neutrophils Absolute: 4.4 10*3/uL (ref 1.4–7.0)
Neutrophils: 74 %
Platelets: 199 10*3/uL (ref 150–450)
RBC: 3.68 x10E6/uL — ABNORMAL LOW (ref 4.14–5.80)
RDW: 12.1 % (ref 11.6–15.4)
WBC: 6 10*3/uL (ref 3.4–10.8)

## 2019-05-27 LAB — BASIC METABOLIC PANEL
BUN/Creatinine Ratio: 16 (ref 9–20)
BUN: 27 mg/dL — ABNORMAL HIGH (ref 6–24)
CO2: 23 mmol/L (ref 20–29)
Calcium: 8.5 mg/dL — ABNORMAL LOW (ref 8.7–10.2)
Chloride: 106 mmol/L (ref 96–106)
Creatinine, Ser: 1.65 mg/dL — ABNORMAL HIGH (ref 0.76–1.27)
GFR calc Af Amer: 56 mL/min/{1.73_m2} — ABNORMAL LOW (ref >59)
GFR calc non Af Amer: 48 mL/min/{1.73_m2} — ABNORMAL LOW (ref >59)
Glucose: 290 mg/dL — ABNORMAL HIGH (ref 65–99)
Potassium: 4 mmol/L (ref 3.5–5.2)
Sodium: 142 mmol/L (ref 134–144)

## 2019-05-27 LAB — HEPATIC FUNCTION PANEL
ALT: 11 IU/L (ref 0–44)
AST: 13 IU/L (ref 0–40)
Albumin: 3.2 g/dL — ABNORMAL LOW (ref 4.0–5.0)
Alkaline Phosphatase: 138 IU/L — ABNORMAL HIGH (ref 39–117)
Bilirubin Total: 0.3 mg/dL (ref 0.0–1.2)
Bilirubin, Direct: 0.09 mg/dL (ref 0.00–0.40)
Total Protein: 6 g/dL (ref 6.0–8.5)

## 2019-05-27 LAB — PSA: Prostate Specific Ag, Serum: 0.4 ng/mL (ref 0.0–4.0)

## 2019-05-27 LAB — MICROALBUMIN / CREATININE URINE RATIO
Creatinine, Urine: 69.8 mg/dL
Microalb/Creat Ratio: 6033 mg/g creat — ABNORMAL HIGH (ref 0–29)
Microalbumin, Urine: 4210.7 ug/mL

## 2019-05-27 LAB — LIPID PANEL
Chol/HDL Ratio: 4.4 ratio (ref 0.0–5.0)
Cholesterol, Total: 148 mg/dL (ref 100–199)
HDL: 34 mg/dL — ABNORMAL LOW (ref >39)
LDL Chol Calc (NIH): 87 mg/dL (ref 0–99)
Triglycerides: 152 mg/dL — ABNORMAL HIGH (ref 0–149)
VLDL Cholesterol Cal: 27 mg/dL (ref 5–40)

## 2019-05-27 LAB — FERRITIN: Ferritin: 455 ng/mL — ABNORMAL HIGH (ref 30–400)

## 2019-05-27 LAB — HEMOGLOBIN A1C
Est. average glucose Bld gHb Est-mCnc: 197 mg/dL
Hgb A1c MFr Bld: 8.5 % — ABNORMAL HIGH (ref 4.8–5.6)

## 2019-05-27 LAB — TSH: TSH: 13.3 u[IU]/mL — ABNORMAL HIGH (ref 0.450–4.500)

## 2019-05-27 LAB — VITAMIN B12: Vitamin B-12: 179 pg/mL — ABNORMAL LOW (ref 232–1245)

## 2019-05-30 ENCOUNTER — Encounter: Payer: Self-pay | Admitting: Internal Medicine

## 2019-05-30 NOTE — Assessment & Plan Note (Signed)
Not watching his diet.  Not checking sugars.  No exercising.  Has gained weight.  Discussed diet and exercise.  Follow met b and a1c.

## 2019-05-30 NOTE — Assessment & Plan Note (Signed)
Recheck cbc.  

## 2019-05-30 NOTE — Assessment & Plan Note (Signed)
Continue losartan. 

## 2019-05-30 NOTE — Assessment & Plan Note (Signed)
On thyroid replacement.  Follow tsh.  

## 2019-05-30 NOTE — Assessment & Plan Note (Signed)
Has had extensive w/up as outlined previously.  On aspirin and plavix.  Did have CTA head and neck as outlined.  Needs f/u with [facility].

## 2019-05-30 NOTE — Assessment & Plan Note (Signed)
Low carb diet and exercise.  Continue lipitor.  Follow lipid panel and liver function tests.

## 2019-05-30 NOTE — Assessment & Plan Note (Signed)
CTA 12/2017 revealed stenosis - carotid.  Discussed f/u with vascular surgery for f/u.

## 2019-05-30 NOTE — Assessment & Plan Note (Signed)
Blood pressure elevated.  Add chlorthalidone.  Follow met b.

## 2019-05-30 NOTE — Assessment & Plan Note (Signed)
Recheck B12 today.

## 2019-05-30 NOTE — Assessment & Plan Note (Signed)
Increased fatigue and daytime somnolence.  Discussed the need for sleep study.  Have tried to schedule.  He has not followed through with scheduling.  He will contact pulmonary.

## 2019-05-30 NOTE — Assessment & Plan Note (Signed)
Increased swelling.  Not watching diet.  Has seen [facility].  Treat elevated blood pressure with addition of chlorthalidone.  Check metabolic panel and liver function tests.  Refer back to [facility]. Discussed leg elevation.

## 2019-05-31 ENCOUNTER — Other Ambulatory Visit: Payer: Self-pay | Admitting: Internal Medicine

## 2019-05-31 DIAGNOSIS — D649 Anemia, unspecified: Secondary | ICD-10-CM

## 2019-05-31 DIAGNOSIS — R944 Abnormal results of kidney function studies: Secondary | ICD-10-CM

## 2019-05-31 NOTE — Progress Notes (Signed)
Orders placed for labs to be drawn at [facility] and ran at [facility]

## 2019-06-02 ENCOUNTER — Other Ambulatory Visit: Payer: Self-pay | Admitting: *Deleted

## 2019-06-02 ENCOUNTER — Institutional Professional Consult (permissible substitution): Payer: Managed Care, Other (non HMO) | Admitting: Internal Medicine

## 2019-06-02 MED ORDER — LEVOTHYROXINE SODIUM 125 MCG PO TABS: 125.0000 ug | ORAL_TABLET | Freq: Every day | ORAL | 0 refills | Status: DC

## 2019-06-02 NOTE — Progress Notes (Signed)
Per lab results note from Dr. Nicki Reaper from labs on 05/26/2019, synthroid 125 mcg sent to patient's pharmacy.

## 2019-06-03 ENCOUNTER — Other Ambulatory Visit: Payer: Self-pay | Admitting: Internal Medicine

## 2019-06-03 DIAGNOSIS — E11319 Type 2 diabetes mellitus with unspecified diabetic retinopathy without macular edema: Secondary | ICD-10-CM

## 2019-06-03 NOTE — Progress Notes (Signed)
Order placed for [facility] referral.

## 2019-06-04 ENCOUNTER — Other Ambulatory Visit: Payer: Self-pay | Admitting: Internal Medicine

## 2019-06-04 ENCOUNTER — Encounter: Payer: Self-pay | Admitting: *Deleted

## 2019-06-04 NOTE — Telephone Encounter (Signed)
Medication Refill - Medication: pregabalin (LYRICA) 100 MG capsule   Preferred Pharmacy (with phone number or street name):  [id number] [address], [place] [phone] (Phone) [phone] (Fax)

## 2019-06-07 ENCOUNTER — Encounter: Payer: Self-pay | Admitting: Internal Medicine

## 2019-06-07 ENCOUNTER — Ambulatory Visit (INDEPENDENT_AMBULATORY_CARE_PROVIDER_SITE_OTHER): Payer: Managed Care, Other (non HMO) | Admitting: *Deleted

## 2019-06-07 ENCOUNTER — Other Ambulatory Visit: Payer: Self-pay

## 2019-06-07 DIAGNOSIS — G459 Transient cerebral ischemic attack, unspecified: Secondary | ICD-10-CM

## 2019-06-07 DIAGNOSIS — R55 Syncope and collapse: Secondary | ICD-10-CM

## 2019-06-07 LAB — CUP PACEART REMOTE DEVICE CHECK
Date Time Interrogation Session: 20201012172818
Implantable Pulse Generator Implant Date: 20190604

## 2019-06-07 MED ORDER — PREGABALIN 100 MG PO CAPS: 100.0000 mg | ORAL_CAPSULE | Freq: Three times a day (TID) | ORAL | 1 refills | Status: DC

## 2019-06-07 NOTE — Telephone Encounter (Signed)
rx sent into pharmacy

## 2019-06-07 NOTE — Progress Notes (Signed)
rx sent in for lyrica

## 2019-06-07 NOTE — Telephone Encounter (Signed)
Last filled 2/20 with refill

## 2019-06-07 NOTE — Progress Notes (Signed)
Pt was just seen recently and last refill was 10/05/18. It will not let me fill this medication electronically. I have tried with multiple patients  rx sent in for lyrica

## 2019-06-07 NOTE — Telephone Encounter (Signed)
See my chart message.  Needs appt for B12 and wife to come to show can properly give injection.

## 2019-06-08 ENCOUNTER — Other Ambulatory Visit: Payer: Self-pay

## 2019-06-08 ENCOUNTER — Encounter (INDEPENDENT_AMBULATORY_CARE_PROVIDER_SITE_OTHER): Payer: Self-pay | Admitting: Vascular Surgery

## 2019-06-08 ENCOUNTER — Ambulatory Visit (INDEPENDENT_AMBULATORY_CARE_PROVIDER_SITE_OTHER): Payer: Managed Care, Other (non HMO) | Admitting: Vascular Surgery

## 2019-06-08 VITALS — BP 194/82 | HR 76 | Resp 16 | Wt 306.0 lb

## 2019-06-08 DIAGNOSIS — M6530 Trigger finger, unspecified finger: Secondary | ICD-10-CM | POA: Insufficient documentation

## 2019-06-08 DIAGNOSIS — I10 Essential (primary) hypertension: Secondary | ICD-10-CM | POA: Diagnosis not present

## 2019-06-08 DIAGNOSIS — E785 Hyperlipidemia, unspecified: Secondary | ICD-10-CM | POA: Diagnosis not present

## 2019-06-08 DIAGNOSIS — I890 Lymphedema, not elsewhere classified: Secondary | ICD-10-CM

## 2019-06-08 DIAGNOSIS — M7989 Other specified soft tissue disorders: Secondary | ICD-10-CM

## 2019-06-08 DIAGNOSIS — Z794 Long term (current) use of insulin: Secondary | ICD-10-CM

## 2019-06-08 DIAGNOSIS — E11319 Type 2 diabetes mellitus with unspecified diabetic retinopathy without macular edema: Secondary | ICD-10-CM

## 2019-06-08 NOTE — Patient Instructions (Signed)

## 2019-06-08 NOTE — Progress Notes (Signed)
Pt aware.

## 2019-06-08 NOTE — Assessment & Plan Note (Signed)
Not improved.  Recommend addition of the lymphedema pump

## 2019-06-08 NOTE — Progress Notes (Signed)
MRN : [id number]  Gregory Crane is a 48 y.o. (10-14-1970) male who presents with chief complaint of  Chief Complaint  Patient presents with  . Follow-up    3-78month follow up  .  History of Present Illness: Patient returns today in follow up of his leg swelling.  He is wearing compression stockings today and has worn them most days.  He continues to be bothered by daily swelling and heaviness in his leg.  He has been out of his walking boot for a few weeks now after his foot injury.  He clearly has a component of lymphedema from chronic scarring of the lymph channels likely exacerbated by his injury.  No new ulceration or infection.  His swelling is no better to slightly worse than it was at his last visit 3 months ago.  Current Outpatient Medications  Medication Sig Dispense Refill  . amLODipine (NORVASC) 5 MG tablet Take 1 tablet (5 mg total) by mouth daily. 30 tablet 1  . aspirin (ASPIRIN LOW DOSE) 81 MG EC tablet TAKE 1 TABLET(81 MG) BY MOUTH DAILY 90 tablet 0  . atorvastatin (LIPITOR) 80 MG tablet Take 1 tablet (80 mg total) by mouth daily. 30 tablet 0  . chlorthalidone (HYGROTON) 25 MG tablet Take 1 tablet (25 mg total) by mouth daily. 30 tablet 1  . clopidogrel (PLAVIX) 75 MG tablet TAKE 1 TABLET(75 MG) BY MOUTH DAILY 90 tablet 2  . DULoxetine (CYMBALTA) 20 MG capsule Take 20 mg by mouth daily.   3  . glucose blood (FREESTYLE LITE) test strip Check blood sugars twice a day (Dx. 250.02) 200 each 1  . HUMALOG 100 UNIT/ML injection INJECT 4 TO 6 UNITS UNDER THE SKIN THREE TIMES DAILY 30 mL 0  . INSULIN SYRINGE .5CC/29G 29G X 1/2" 0.5 ML MISC USE AS DIRECTED 300 each 0  . Insulin Syringe-Needle U-100 (INSULIN SYRINGE .5CC/30GX5/16") 30G X 5/16" 0.5 ML MISC USE AS DIRECTED 300 each 0  . Insulin Syringe-Needle U-100 27G X 1/2" 1 ML MISC Use as directed with insulin. One needle per use. 100 each 2  . LANTUS 100 UNIT/ML injection INJECT 15 UNITS UNDER THE SKIN EVERY NIGHT AT BEDTIME 30  mL 0  . levothyroxine (SYNTHROID) 125 MCG tablet Take 1 tablet (125 mcg total) by mouth daily before breakfast. 90 tablet 0  . losartan (COZAAR) 100 MG tablet Take 1 tablet (100 mg total) by mouth daily. 30 tablet 3  . metFORMIN (GLUCOPHAGE) 500 MG tablet Take 1 tablet (500 mg total) by mouth daily with breakfast. 90 tablet 2  . ONETOUCH DELICA LANCETS FINE MISC 1 applicator by Other route 4 (four) times daily.  0  . pregabalin (LYRICA) 100 MG capsule Take 1 capsule (100 mg total) by mouth 3 (three) times daily. 270 capsule 1   No current facility-administered medications for this visit.     Past Medical History:  Diagnosis Date  . Allergy   . Bell's palsy   . Diabetes mellitus without complication (HCC)    diet controlled  . Hypertension   . Hypothyroidism   . Kidney stones   . Stroke [facility])     Past Surgical History:  Procedure Laterality Date  . LOOP RECORDER INSERTION N/A 01/27/2018   Procedure: LOOP RECORDER INSERTION;  Surgeon: [name], MD;  Location: [place] [facility];  Service: Cardiovascular;  Laterality: N/A;  . LUMBAR PUNCTURE     as child  . NO PAST SURGERIES    .  TEE WITHOUT CARDIOVERSION N/A 01/07/2018   Procedure: TRANSESOPHAGEAL ECHOCARDIOGRAM (TEE);  Surgeon: Minna Merritts, MD;  Location: [facility] OR[facility];  Service: Cardiovascular;  Laterality: N/A;    Social History        Tobacco Use  . Smoking status: Never Smoker  . Smokeless tobacco: Never Used  Substance Use Topics  . Alcohol use: No    Alcohol/week: 0.0 standard drinks    Comment: very rarely (1-2/year)  . Drug use: No     Family History      Family History  Problem Relation Age of Onset  . Breast cancer Mother   . Diabetes Father   . Diabetes Sister   . Arthritis Maternal Grandmother   . Diabetes Maternal Grandmother      No Known Allergies   REVIEW OF SYSTEMS(Negative unless checked)  Constitutional: [] ??Weight loss[] ??Fever[] ??Chills  Cardiac:[] ??Chest pain[] ??Chest pressure[] ??Palpitations [] ??Shortness of breath when laying flat [] ??Shortness of breath at rest [] ??Shortness of breath with exertion. Vascular: [x] ??Pain in legs with walking[x] ??Pain in legsat rest[] ??Pain in legs when laying flat [] ??Claudication [] ??Pain in feet when walking [] ??Pain in feet at rest [] ??Pain in feet when laying flat [] ??History of DVT [] ??Phlebitis [x] ??Swelling in legs [] ??Varicose veins [] ??Non-healing ulcers Pulmonary: [] ??Uses home oxygen [] ??Productive cough[] ??Hemoptysis [] ??Wheeze [] ??COPD [] ??Asthma Neurologic: [] ??Dizziness [] ??Blackouts [] ??Seizures [x] ??History of stroke [] ??History of TIA[] ??Aphasia [] ??Temporary blindness[] ??Dysphagia [] ??Weaknessor numbness in arms [] ??Weakness or numbnessin legs Musculoskeletal: [] ??Arthritis [] ??Joint swelling [] ??Joint pain [] ??Low back pain Hematologic:[] ??Easy bruising[] ??Easy bleeding [] ??Hypercoagulable state [] ??Anemic [] ??Hepatitis Gastrointestinal:[] ??Blood in stool[] ??Vomiting blood[] ??Gastroesophageal reflux/heartburn[] ??Abdominal pain Genitourinary: [] ??Chronic kidney disease [] ??Difficulturination [] ??Frequenturination [] ??Burning with urination[] ??Hematuria Skin: [] ??Rashes [] ??Ulcers [] ??Wounds Psychological: [] ??History of anxiety[] ??History of major depression.    Physical Examination  BP (!) 194/82 (BP Location: Right Arm)   Pulse 76   Resp 16   Wt (!) 306 lb (138.8 kg)   BMI 41.50 kg/m  Gen:  WD/WN, NAD Head: [place]/AT, No temporalis wasting. Ear/Nose/Throat: Hearing grossly intact, nares w/o erythema or drainage Eyes: Conjunctiva clear. Sclera non-icteric Neck: Supple.  Trachea midline Pulmonary:  Good air movement, no use of accessory muscles.  Cardiac: RRR, no JVD Vascular:  Vessel Right Left  Radial Palpable Palpable                        Musculoskeletal: M/S 5/5 throughout.  No deformity or atrophy.  2+ bilateral lower extremity edema. Neurologic: Sensation grossly intact in extremities.  Symmetrical.  Speech is fluent.  Psychiatric: Judgment intact, Mood & affect appropriate for pt's clinical situation. Dermatologic: No rashes or ulcers noted.  No cellulitis or open wounds.       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 Reveal LINQ    Pulse Gen Serial Number [id number] [place] [facility]    Implantable Pulse Generator Type ICM/ILR    Implantable Pulse Generator Implant Date [id number]   CUP PACEART REMOTE DEVICE CHECK     Status: None   Collection Time: 05/04/19 10:09 PM  Result Value Ref Range   Date Time Interrogation Session [id number]    Pulse Generator Manufacturer MERM    Pulse Gen Model [id number] Reveal LINQ    Pulse Gen Serial Number [id number]    Clinic Name [facility]  Implantable Pulse Generator Type ICM/ILR    Implantable Pulse Generator Implant Date [id number]   CBC with Differential/Platelet     Status: Abnormal   Collection Time: 05/26/19  9:28 AM  Result Value Ref Range   WBC 6.0 3.4 - 10.8 x10E3/uL   RBC 3.68 (L) 4.14 - 5.80 x10E6/uL   Hemoglobin 10.7 (L) 13.0 - 17.7 g/dL   Hematocrit 31.0 (L) 37.5 - 51.0 %   MCV 84 79 - 97 fL   MCH 29.1 26.6 - 33.0 pg   MCHC 34.5 31.5 - 35.7 g/dL   RDW 12.1 11.6 - 15.4 %   Platelets 199 150 - 450 x10E3/uL   Neutrophils 74 Not Estab. %   Lymphs 17 Not Estab. %   Monocytes 7 Not Estab. %   Eos 1 Not Estab. %   Basos 1 Not Estab. %   Neutrophils Absolute 4.4 1.4 - 7.0 x10E3/uL   Lymphocytes Absolute 1.0 0.7 - 3.1 x10E3/uL   Monocytes Absolute 0.4 0.1 - 0.9 x10E3/uL   EOS (ABSOLUTE) 0.1 0.0 - 0.4 x10E3/uL    Basophils Absolute 0.0 0.0 - 0.2 x10E3/uL   Immature Granulocytes 0 Not Estab. %   Immature Grans (Abs) 0.0 0.0 - 0.1 x10E3/uL  Hepatic function panel     Status: Abnormal   Collection Time: 05/26/19  9:28 AM  Result Value Ref Range   Total Protein 6.0 6.0 - 8.5 g/dL   Albumin 3.2 (L) 4.0 - 5.0 g/dL   Bilirubin Total 0.3 0.0 - 1.2 mg/dL   Bilirubin, Direct 0.09 0.00 - 0.40 mg/dL   Alkaline Phosphatase 138 (H) 39 - 117 IU/L   AST 13 0 - 40 IU/L   ALT 11 0 - 44 IU/L  Hemoglobin A1c     Status: Abnormal   Collection Time: 05/26/19  9:28 AM  Result Value Ref Range   Hgb A1c MFr Bld 8.5 (H) 4.8 - 5.6 %    Comment:          Prediabetes: 5.7 - 6.4          Diabetes: >6.4          Glycemic control for adults with diabetes: <7.0    Est. average glucose Bld gHb Est-mCnc 197 mg/dL  Ferritin     Status: Abnormal   Collection Time: 05/26/19  9:28 AM  Result Value Ref Range   Ferritin 455 (H) 30 - 400 ng/mL  Lipid panel     Status: Abnormal   Collection Time: 05/26/19  9:28 AM  Result Value Ref Range   Cholesterol, Total 148 100 - 199 mg/dL   Triglycerides 152 (H) 0 - 149 mg/dL   HDL 34 (L) >39 mg/dL   VLDL Cholesterol Cal 27 5 - 40 mg/dL   LDL Chol Calc (NIH) 87 0 - 99 mg/dL   Chol/HDL Ratio 4.4 0.0 - 5.0 ratio    Comment:                                   T. Chol/HDL Ratio                                             Men  Women  1/2 Avg.Risk  3.4    3.3                                   Avg.Risk  5.0    4.4                                2X Avg.Risk  9.6    7.1                                3X Avg.Risk 23.4   11.0   TSH     Status: Abnormal   Collection Time: 05/26/19  9:28 AM  Result Value Ref Range   TSH 13.300 (H) 0.450 - 4.500 uIU/mL  PSA     Status: None   Collection Time: 05/26/19  9:28 AM  Result Value Ref Range   Prostate Specific Ag, Serum 0.4 0.0 - 4.0 ng/mL    Comment: Roche ECLIA methodology. According to the American Urological  Association, Serum PSA should decrease and remain at undetectable levels after radical prostatectomy. The AUA defines biochemical recurrence as an initial PSA value 0.2 ng/mL or greater followed by a subsequent confirmatory PSA value 0.2 ng/mL or greater. Values obtained with different assay methods or kits cannot be used interchangeably. Results cannot be interpreted as absolute evidence of the presence or absence of malignant disease.   Basic metabolic panel     Status: Abnormal   Collection Time: 05/26/19  9:28 AM  Result Value Ref Range   Glucose 290 (H) 65 - 99 mg/dL   BUN 27 (H) 6 - 24 mg/dL   Creatinine, Ser 1.65 (H) 0.76 - 1.27 mg/dL   GFR calc non Af Amer 48 (L) >59 mL/min/1.73   GFR calc Af Amer 56 (L) >59 mL/min/1.73   BUN/Creatinine Ratio 16 9 - 20   Sodium 142 134 - 144 mmol/L   Potassium 4.0 3.5 - 5.2 mmol/L   Chloride 106 96 - 106 mmol/L   CO2 23 20 - 29 mmol/L   Calcium 8.5 (L) 8.7 - 10.2 mg/dL  Vitamin B12     Status: Abnormal   Collection Time: 05/26/19  9:28 AM  Result Value Ref Range   Vitamin B-12 179 (L) 232 - 1,245 pg/mL  Microalbumin / creatinine urine ratio     Status: Abnormal   Collection Time: 05/26/19  9:31 AM  Result Value Ref Range   Creatinine, Urine 69.8 Not Estab. mg/dL   Microalbumin, Urine 4,210.7 Not Estab. ug/mL    Comment: Results confirmed on dilution.    Microalb/Creat Ratio 6,033 (H) 0 - 29 mg/g creat    Comment:                        Normal:                0 -  29                        Moderately increased: 30 - 300                        Severely increased:       >300               **  Please note reference interval change**   CUP PACEART REMOTE DEVICE CHECK     Status: None   Collection Time: 06/07/19  8:15 PM  Result Value Ref Range   Date Time Interrogation Session [phone]) [phone]    Pulse Generator Manufacturer MERM    Pulse Gen Model [id number] Reveal LINQ    Pulse Gen Serial Number [id number]    Clinic Name [facility] Pulse Generator Type ICM/ILR    Implantable Pulse Generator Implant Date [id number]     Radiology No results found.  Assessment/Plan Essential hypertension blood pressure control important in reducing the progression of atherosclerotic disease. On appropriate oral medications.   Diabetes blood glucose control important in reducing the progression of atherosclerotic disease. Also, involved in wound healing. On appropriate medications.   Hyperlipidemia lipid control important in reducing the progression of atherosclerotic disease. Continue statin therapy  Swelling of left lower extremity Not improved.  Recommend addition of the lymphedema pump  Lymphedema The patient has refractory pain and swelling in the legs despite appropriate use of conservative therapies with compression stocking, leg elevation, and increasing his activity.  He has developed lymphedema and now has stage II lymphedema with swelling refractory to conservative measures.  He would benefit from a lymphedema pump.  We will try to get this approved and then plan to see him back in 4 to 6 months in follow-up to see how he is doing with the pump.    Leotis Pain, MD  06/08/2019 9:56 AM    This note was created with Dragon medical transcription system.  Any errors from dictation are purely unintentional

## 2019-06-08 NOTE — Assessment & Plan Note (Signed)
The patient has refractory pain and swelling in the legs despite appropriate use of conservative therapies with compression stocking, leg elevation, and increasing his activity.  He has developed lymphedema and now has stage II lymphedema with swelling refractory to conservative measures.  He would benefit from a lymphedema pump.  We will try to get this approved and then plan to see him back in 4 to 6 months in follow-up to see how he is doing with the pump.

## 2019-06-09 ENCOUNTER — Institutional Professional Consult (permissible substitution): Payer: Managed Care, Other (non HMO) | Admitting: Internal Medicine

## 2019-06-09 LAB — CBC WITH DIFFERENTIAL/PLATELET
Basophils Absolute: 0 10*3/uL (ref 0.0–0.2)
Basos: 1 %
EOS (ABSOLUTE): 0.2 10*3/uL (ref 0.0–0.4)
Eos: 2 %
Hematocrit: 33.2 % — ABNORMAL LOW (ref 37.5–51.0)
Hemoglobin: 11 g/dL — ABNORMAL LOW (ref 13.0–17.7)
Immature Grans (Abs): 0 10*3/uL (ref 0.0–0.1)
Immature Granulocytes: 0 %
Lymphocytes Absolute: 1.3 10*3/uL (ref 0.7–3.1)
Lymphs: 20 %
MCH: 28.9 pg (ref 26.6–33.0)
MCHC: 33.1 g/dL (ref 31.5–35.7)
MCV: 87 fL (ref 79–97)
Monocytes Absolute: 0.5 10*3/uL (ref 0.1–0.9)
Monocytes: 8 %
Neutrophils Absolute: 4.5 10*3/uL (ref 1.4–7.0)
Neutrophils: 69 %
Platelets: 244 10*3/uL (ref 150–450)
RBC: 3.8 x10E6/uL — ABNORMAL LOW (ref 4.14–5.80)
RDW: 12.3 % (ref 11.6–15.4)
WBC: 6.5 10*3/uL (ref 3.4–10.8)

## 2019-06-09 LAB — LIPID PANEL
Chol/HDL Ratio: 3.8 ratio (ref 0.0–5.0)
Cholesterol, Total: 138 mg/dL (ref 100–199)
HDL: 36 mg/dL — ABNORMAL LOW (ref >39)
LDL Chol Calc (NIH): 75 mg/dL (ref 0–99)
Triglycerides: 152 mg/dL — ABNORMAL HIGH (ref 0–149)
VLDL Cholesterol Cal: 27 mg/dL (ref 5–40)

## 2019-06-09 LAB — BASIC METABOLIC PANEL
BUN/Creatinine Ratio: 16 (ref 9–20)
BUN: 29 mg/dL — ABNORMAL HIGH (ref 6–24)
CO2: 25 mmol/L (ref 20–29)
Calcium: 9.1 mg/dL (ref 8.7–10.2)
Chloride: 104 mmol/L (ref 96–106)
Creatinine, Ser: 1.78 mg/dL — ABNORMAL HIGH (ref 0.76–1.27)
GFR calc Af Amer: 51 mL/min/{1.73_m2} — ABNORMAL LOW (ref >59)
GFR calc non Af Amer: 44 mL/min/{1.73_m2} — ABNORMAL LOW (ref >59)
Glucose: 219 mg/dL — ABNORMAL HIGH (ref 65–99)
Potassium: 4.8 mmol/L (ref 3.5–5.2)
Sodium: 142 mmol/L (ref 134–144)

## 2019-06-09 LAB — FERRITIN: Ferritin: 456 ng/mL — ABNORMAL HIGH (ref 30–400)

## 2019-06-09 LAB — HEPATIC FUNCTION PANEL
ALT: 14 IU/L (ref 0–44)
AST: 16 IU/L (ref 0–40)
Albumin: 3.5 g/dL — ABNORMAL LOW (ref 4.0–5.0)
Alkaline Phosphatase: 139 IU/L — ABNORMAL HIGH (ref 39–117)
Bilirubin Total: 0.3 mg/dL (ref 0.0–1.2)
Bilirubin, Direct: 0.1 mg/dL (ref 0.00–0.40)
Total Protein: 6.5 g/dL (ref 6.0–8.5)

## 2019-06-09 LAB — PSA: Prostate Specific Ag, Serum: 0.4 ng/mL (ref 0.0–4.0)

## 2019-06-09 LAB — MICROALBUMIN / CREATININE URINE RATIO
Creatinine, Urine: 60.1 mg/dL
Microalb/Creat Ratio: 3657 mg/g creat — ABNORMAL HIGH (ref 0–29)
Microalbumin, Urine: 2198.1 ug/mL

## 2019-06-09 LAB — VITAMIN B12: Vitamin B-12: 127 pg/mL — ABNORMAL LOW (ref 232–1245)

## 2019-06-09 LAB — HEMOGLOBIN A1C
Est. average glucose Bld gHb Est-mCnc: 180 mg/dL
Hgb A1c MFr Bld: 7.9 % — ABNORMAL HIGH (ref 4.8–5.6)

## 2019-06-09 LAB — TSH: TSH: 13.3 u[IU]/mL — ABNORMAL HIGH (ref 0.450–4.500)

## 2019-06-14 ENCOUNTER — Ambulatory Visit: Payer: Self-pay | Admitting: Pharmacist

## 2019-06-14 NOTE — Progress Notes (Signed)
Reviewed.  Agree with f/u.    Dr Nicki Reaper

## 2019-06-14 NOTE — Chronic Care Management (AMB) (Signed)
  Chronic Care Management   Note  06/14/2019 Name: Yariel Ferraris MRN: [id number] DOB: 1971-01-29  Vernia Buff Walder is a 48 y.o. year old male who is a primary care patient of [name], MD. The CCM team was consulted for assistance with chronic disease management and care coordination needs.    Contacted patient to scheduled phone appointment for medication management. Patient requested a 1 pm appointment, as that is when he takes lunch. Scheduled for next available 1 pm appointment on 07/15/2019.    Catie [name], PharmD, [place] Pharmacist [facility] [phone]

## 2019-06-15 ENCOUNTER — Other Ambulatory Visit: Payer: Self-pay | Admitting: Internal Medicine

## 2019-06-15 DIAGNOSIS — R944 Abnormal results of kidney function studies: Secondary | ICD-10-CM

## 2019-06-15 DIAGNOSIS — N1832 Chronic kidney disease, stage 3b: Secondary | ICD-10-CM

## 2019-06-15 NOTE — Progress Notes (Signed)
Order placed for renal ultrasound.  

## 2019-06-16 ENCOUNTER — Telehealth: Payer: Self-pay

## 2019-06-16 ENCOUNTER — Other Ambulatory Visit: Payer: Self-pay

## 2019-06-16 ENCOUNTER — Ambulatory Visit (INDEPENDENT_AMBULATORY_CARE_PROVIDER_SITE_OTHER): Payer: Managed Care, Other (non HMO)

## 2019-06-16 DIAGNOSIS — E538 Deficiency of other specified B group vitamins: Secondary | ICD-10-CM

## 2019-06-16 MED ORDER — CYANOCOBALAMIN 1000 MCG/ML IJ SOLN
1000.0000 ug | Freq: Once | INTRAMUSCULAR | Status: AC
  Administered 2019-06-16: 1000 ug via INTRAMUSCULAR

## 2019-06-16 NOTE — Progress Notes (Signed)
Patient presented today for 1 out of 4 weekly B12 injections.  Patient will then go to once monthly per MD order.  (See Lab Notes for 05/26/19).  Patient is here for first injection only.  Patient stated that his wife will be giving him injections at home.  Injection administered today in right deltoid. Patient tolerated well with no signs of distress.

## 2019-06-16 NOTE — Telephone Encounter (Signed)
Patient presented today for 1st out 4 weekly B12 injections then patient will go to once every month per Dr. Nicki Reaper from 05/26/19 lab notes.  Patient said that his wife has given him injections before and would like for his wife to administer injections at home.  Patient is requesting B12 Rx be sent to pharmacy.  Pharmacy listed in chart confirmed.

## 2019-06-17 ENCOUNTER — Other Ambulatory Visit: Payer: Self-pay

## 2019-06-17 MED ORDER — "SYRINGE 25G X 1"" 3 ML MISC": 1 refills | Status: DC

## 2019-06-17 MED ORDER — CYANOCOBALAMIN 1000 MCG/ML IJ SOLN: INTRAMUSCULAR | 0 refills | Status: DC

## 2019-06-17 NOTE — Progress Notes (Signed)
[facility] Summary Report / Loop Recorder 

## 2019-06-17 NOTE — Telephone Encounter (Signed)
b12 injections sent in

## 2019-06-21 ENCOUNTER — Other Ambulatory Visit: Payer: Self-pay

## 2019-06-21 ENCOUNTER — Encounter: Payer: Self-pay | Admitting: Internal Medicine

## 2019-06-21 MED ORDER — "SYRINGE 25G X 1"" 3 ML MISC": 1 refills | Status: DC

## 2019-06-21 MED ORDER — "INSULIN SYRINGE 30G X 5/16"" 0.5 ML MISC": 0 refills | Status: DC

## 2019-06-24 ENCOUNTER — Other Ambulatory Visit: Payer: Self-pay

## 2019-06-24 ENCOUNTER — Ambulatory Visit: Payer: Managed Care, Other (non HMO) | Admitting: Internal Medicine

## 2019-06-24 VITALS — BP 152/90 | HR 79 | Temp 97.4°F | Resp 16 | Wt 301.0 lb

## 2019-06-24 DIAGNOSIS — D649 Anemia, unspecified: Secondary | ICD-10-CM | POA: Diagnosis not present

## 2019-06-24 DIAGNOSIS — N1832 Chronic kidney disease, stage 3b: Secondary | ICD-10-CM

## 2019-06-24 DIAGNOSIS — Z794 Long term (current) use of insulin: Secondary | ICD-10-CM

## 2019-06-24 DIAGNOSIS — R400 Somnolence: Secondary | ICD-10-CM

## 2019-06-24 DIAGNOSIS — E039 Hypothyroidism, unspecified: Secondary | ICD-10-CM

## 2019-06-24 DIAGNOSIS — I10 Essential (primary) hypertension: Secondary | ICD-10-CM

## 2019-06-24 DIAGNOSIS — E785 Hyperlipidemia, unspecified: Secondary | ICD-10-CM

## 2019-06-24 DIAGNOSIS — E11319 Type 2 diabetes mellitus with unspecified diabetic retinopathy without macular edema: Secondary | ICD-10-CM

## 2019-06-24 DIAGNOSIS — Z8673 Personal history of transient ischemic attack (TIA), and cerebral infarction without residual deficits: Secondary | ICD-10-CM

## 2019-06-24 MED ORDER — HYDRALAZINE HCL 10 MG PO TABS: 10.0000 mg | ORAL_TABLET | Freq: Three times a day (TID) | ORAL | 2 refills | Status: DC

## 2019-06-24 NOTE — Progress Notes (Signed)
Patient ID: Gregory Crane, male   DOB: Apr 25, 1971, 48 y.o.   MRN: [id number]   Subjective:    Patient ID: Gregory Crane, male    DOB: [birth year], 48 y.o.   MRN: [id number]  HPI  Patient here for a scheduled follow up.  His wife was connected in via video from his cell phone.  He does not check his sugar regularly.  Not exercising and not watching his diet.  Discussed importance of diet and exercise.  Last a1c 7.9.  No chest pain.  No sob.  No acid reflux.  No abdominal pain.  Lower extremity swelling.  Seeing vascular surgery.  Wearing compression hose.  Planning lymphedema pump.  Not checking his blood pressure.  Blood pressure elevated last visit.  Added chlorthalidone.  Renal function decreased.  Recheck today.  Increased stress with work.  Discussed the need for renal ultrasound.      Past Medical History:  Diagnosis Date  . Allergy   . Bell's palsy   . Diabetes mellitus without complication (HCC)    diet controlled  . Hypertension   . Hypothyroidism   . Kidney stones   . Stroke [facility])    Past Surgical History:  Procedure Laterality Date  . LOOP RECORDER INSERTION N/A 01/27/2018   Procedure: LOOP RECORDER INSERTION;  Surgeon: [name], MD;  Location: [place] [facility];  Service: Cardiovascular;  Laterality: N/A;  . LUMBAR PUNCTURE     as child  . NO PAST SURGERIES    . TEE WITHOUT CARDIOVERSION N/A 01/07/2018   Procedure: TRANSESOPHAGEAL ECHOCARDIOGRAM (TEE);  Surgeon: Minna Merritts, MD;  Location: [facility] ORS;  Service: Cardiovascular;  Laterality: N/A;   Family History  Problem Relation Age of Onset  . Breast cancer Mother   . Diabetes Father   . Diabetes Sister   . Arthritis Maternal Grandmother   . Diabetes Maternal Grandmother    Social History   Socioeconomic History  . Marital status: Married    Spouse name: [name]  . Number of children: 2  . Years of education: Not on file  . Highest education level: Not on file  Occupational History  . Not on  file  Social Needs  . Financial resource strain: Not hard at all  . Food insecurity    Worry: Never true    Inability: Never true  . Transportation needs    Medical: No    Non-medical: No  Tobacco Use  . Smoking status: Never Smoker  . Smokeless tobacco: Never Used  Substance and Sexual Activity  . Alcohol use: No    Alcohol/week: 0.0 standard drinks    Comment: very rarely (1-2/year)  . Drug use: No  . Sexual activity: Yes  Lifestyle  . Physical activity    Days per week: 0 days    Minutes per session: 0 min  . Stress: To some extent  Relationships  . Social connections    Talks on phone: More than three times a week    Gets together: More than three times a week    Attends religious service: More than 4 times per year    Active member of club or organization: No    Attends meetings of clubs or organizations: Never    Relationship status: Married  Other Topics Concern  . Not on file  Social History Narrative   Lives at home with wife, independent at baseline.    Outpatient Encounter Medications as of 06/24/2019  Medication Sig  .  amLODipine (NORVASC) 5 MG tablet Take 1 tablet (5 mg total) by mouth daily.  Marland Kitchen aspirin (ASPIRIN LOW DOSE) 81 MG EC tablet TAKE 1 TABLET(81 MG) BY MOUTH DAILY  . atorvastatin (LIPITOR) 80 MG tablet Take 1 tablet (80 mg total) by mouth daily.  . chlorthalidone (HYGROTON) 25 MG tablet Take 1 tablet (25 mg total) by mouth daily.  . clopidogrel (PLAVIX) 75 MG tablet TAKE 1 TABLET(75 MG) BY MOUTH DAILY  . cyanocobalamin (,VITAMIN B-12,) 1000 MCG/ML injection Inject 1 mL into the skin once a week for 3 weeks then once every 30 days.  . DULoxetine (CYMBALTA) 20 MG capsule Take 20 mg by mouth daily.   Marland Kitchen glucose blood (FREESTYLE LITE) test strip Check blood sugars twice a day (Dx. 250.02)  . HUMALOG 100 UNIT/ML injection INJECT 4 TO 6 UNITS UNDER THE SKIN THREE TIMES DAILY  . hydrALAZINE (APRESOLINE) 10 MG tablet Take 1 tablet (10 mg total) by mouth 3  (three) times daily.  . Insulin Syringe-Needle U-100 (INSULIN SYRINGE .5CC/30GX5/16") 30G X 5/16" 0.5 ML MISC USE AS DIRECTED WITH INSULIN  . LANTUS 100 UNIT/ML injection INJECT 15 UNITS UNDER THE SKIN EVERY NIGHT AT BEDTIME  . levothyroxine (SYNTHROID) 125 MCG tablet Take 1 tablet (125 mcg total) by mouth daily before breakfast.  . losartan (COZAAR) 100 MG tablet Take 1 tablet (100 mg total) by mouth daily.  . metFORMIN (GLUCOPHAGE) 500 MG tablet Take 1 tablet (500 mg total) by mouth daily with breakfast.  . ONETOUCH DELICA LANCETS FINE MISC 1 applicator by Other route 4 (four) times daily.  . pregabalin (LYRICA) 100 MG capsule Take 1 capsule (100 mg total) by mouth 3 (three) times daily.  . Syringe/Needle, Disp, (SYRINGE 3CC/25GX1") 25G X 1" 3 ML MISC Use as directed to administer b12 injections.   No facility-administered encounter medications on file as of 06/24/2019.    Review of Systems  Constitutional: Negative for appetite change and unexpected weight change.  HENT: Negative for congestion and sinus pressure.   Respiratory: Negative for cough, chest tightness and shortness of breath.   Cardiovascular: Positive for leg swelling. Negative for chest pain and palpitations.  Gastrointestinal: Negative for abdominal pain, diarrhea, nausea and vomiting.  Genitourinary: Negative for difficulty urinating and dysuria.  Musculoskeletal: Negative for joint swelling and myalgias.  Skin: Negative for color change and rash.  Neurological: Negative for dizziness, light-headedness and headaches.  Psychiatric/Behavioral: Negative for agitation and dysphoric mood.       Objective:    Physical Exam Constitutional:      General: He is not in acute distress.    Appearance: Normal appearance. He is well-developed.  HENT:     Head: Normocephalic and atraumatic.     Right Ear: External ear normal.     Left Ear: External ear normal.  Eyes:     General: No scleral icterus.       Right eye: No  discharge.        Left eye: No discharge.     Conjunctiva/sclera: Conjunctivae normal.  Neck:     Musculoskeletal: Neck supple. No muscular tenderness.  Cardiovascular:     Rate and Rhythm: Normal rate and regular rhythm.  Pulmonary:     Effort: Pulmonary effort is normal. No respiratory distress.     Breath sounds: Normal breath sounds.  Abdominal:     General: Bowel sounds are normal.     Palpations: Abdomen is soft.     Tenderness: There is no abdominal tenderness.  Musculoskeletal:     Comments: Pedal and ankle edema. Some improvement when compared to previous check.    Skin:    Findings: No erythema or rash.  Neurological:     Mental Status: He is alert.  Psychiatric:        Mood and Affect: Mood normal.        Behavior: Behavior normal.     BP (!) 152/90   Pulse 79   Temp (!) 97.4 F (36.3 C)   Resp 16   Wt (!) 301 lb (136.5 kg)   SpO2 97%   BMI 40.82 kg/m  Wt Readings from Last 3 Encounters:  06/24/19 (!) 301 lb (136.5 kg)  06/08/19 (!) 306 lb (138.8 kg)  05/26/19 (!) 311 lb (141.1 kg)     Lab Results  Component Value Date   WBC 6.5 06/08/2019   HGB 11.0 (L) 06/08/2019   HCT 33.2 (L) 06/08/2019   PLT 244 06/08/2019   GLUCOSE 310 (H) 06/24/2019   CHOL 138 06/08/2019   TRIG 152 (H) 06/08/2019   HDL 36 (L) 06/08/2019   LDLCALC 75 06/08/2019   ALT 14 06/08/2019   AST 16 06/08/2019   NA 140 06/24/2019   K 4.5 06/24/2019   CL 102 06/24/2019   CREATININE 2.01 (H) 06/24/2019   BUN 37 (H) 06/24/2019   CO2 24 06/24/2019   TSH 13.300 (H) 06/08/2019   INR 0.85 04/09/2018   HGBA1C 7.9 (H) 06/08/2019    US Venous Img Lower Unilateral Left  Result Date: 12/29/2018 CLINICAL DATA:  48 year old male with swelling EXAM: LEFT LOWER EXTREMITY VENOUS DOPPLER ULTRASOUND TECHNIQUE: Gray-scale sonography with graded compression, as well as color Doppler and duplex ultrasound were performed to evaluate the lower extremity deep venous systems from the level of the  common femoral vein and including the common femoral, femoral, profunda femoral, popliteal and calf veins including the posterior tibial, peroneal and gastrocnemius veins when visible. The superficial great saphenous vein was also interrogated. Spectral Doppler was utilized to evaluate flow at rest and with distal augmentation maneuvers in the common femoral, femoral and popliteal veins. COMPARISON:  None. FINDINGS: Contralateral Common Femoral Vein: Respiratory phasicity is normal and symmetric with the symptomatic side. No evidence of thrombus. Normal compressibility. Common Femoral Vein: No evidence of thrombus. Normal compressibility, respiratory phasicity and response to augmentation. Saphenofemoral Junction: No evidence of thrombus. Normal compressibility and flow on color Doppler imaging. Profunda Femoral Vein: No evidence of thrombus. Normal compressibility and flow on color Doppler imaging. Femoral Vein: No evidence of thrombus. Normal compressibility, respiratory phasicity and response to augmentation. Popliteal Vein: No evidence of thrombus. Normal compressibility, respiratory phasicity and response to augmentation. Calf Veins: No evidence of thrombus. Normal compressibility and flow on color Doppler imaging. Superficial Great Saphenous Vein: No evidence of thrombus. Normal compressibility and flow on color Doppler imaging. Other Findings: Lymph nodes of the inguinal region, with typical architecture maintained. Edema IMPRESSION: Sonographic survey of the left lower extremity negative for DVT. Edema. Inguinal lymph nodes, likely reactive, with typical architecture maintained Electronically Signed   By: Corrie Mckusick D.O.   On: 12/29/2018 10:36       Assessment & Plan:   Problem List Items Addressed This Visit    Anemia    Recheck cbc.       Chronic kidney disease (CKD), stage III (moderate)    Recent labs revealed worsening renal function.  Avoid antiinflammatories.  Get better control of  blood pressure and sugars.  Recheck  today.  Discussed need for renal ultrasound.       Daytime somnolence    Needs appt with pulmonary regarding further evaluation for sleep apnea.  Discussed again with him today.        Diabetes ([place]) - Primary    Sugars have been poorly controlled. Not checking his sugars regularly.  Discussed diet and exercise.  Discussed referral to endocrinology.        Relevant Orders   Basic Metabolic Panel (BMET) (Completed)   Essential hypertension    Blood pressure remains elevated.  Add hydralazine.  Recheck metabolic panel.  Have him spot check his pressure.  Send in readings.        Relevant Medications   hydrALAZINE (APRESOLINE) 10 MG tablet   History of CVA (cerebrovascular accident)    On aspirin and plavix. Has had extensive w/up.  S/p CTA head and neck as outlined previously.  Needs f/u with [facility].        Hyperlipidemia    On lipitor.  Low cholesterol diet and exercise.  Follow lipid panel and liver function tests.        Relevant Medications   hydrALAZINE (APRESOLINE) 10 MG tablet   Hypothyroidism    On thyroid replacement.  Follow tsh.            [name], MD

## 2019-06-25 LAB — BASIC METABOLIC PANEL
BUN/Creatinine Ratio: 18 (ref 9–20)
BUN: 37 mg/dL — ABNORMAL HIGH (ref 6–24)
CO2: 24 mmol/L (ref 20–29)
Calcium: 8.6 mg/dL — ABNORMAL LOW (ref 8.7–10.2)
Chloride: 102 mmol/L (ref 96–106)
Creatinine, Ser: 2.01 mg/dL — ABNORMAL HIGH (ref 0.76–1.27)
GFR calc Af Amer: 44 mL/min/{1.73_m2} — ABNORMAL LOW (ref >59)
GFR calc non Af Amer: 38 mL/min/{1.73_m2} — ABNORMAL LOW (ref >59)
Glucose: 310 mg/dL — ABNORMAL HIGH (ref 65–99)
Potassium: 4.5 mmol/L (ref 3.5–5.2)
Sodium: 140 mmol/L (ref 134–144)

## 2019-06-26 ENCOUNTER — Other Ambulatory Visit: Payer: Self-pay | Admitting: Internal Medicine

## 2019-06-26 DIAGNOSIS — N1832 Chronic kidney disease, stage 3b: Secondary | ICD-10-CM

## 2019-06-26 DIAGNOSIS — E11319 Type 2 diabetes mellitus with unspecified diabetic retinopathy without macular edema: Secondary | ICD-10-CM

## 2019-06-26 NOTE — Progress Notes (Signed)
Order placed for nephrology referral.   °

## 2019-06-26 NOTE — Progress Notes (Signed)
Order placed for endocrinology referral.  

## 2019-06-27 ENCOUNTER — Encounter: Payer: Self-pay | Admitting: Internal Medicine

## 2019-06-27 NOTE — Assessment & Plan Note (Signed)
Recheck cbc.  

## 2019-06-27 NOTE — Assessment & Plan Note (Signed)
Blood pressure remains elevated.  Add hydralazine.  Recheck metabolic panel.  Have him spot check his pressure.  Send in readings.

## 2019-06-27 NOTE — Assessment & Plan Note (Signed)
On aspirin and plavix. Has had extensive w/up.  S/p CTA head and neck as outlined previously.  Needs f/u with [facility].

## 2019-06-27 NOTE — Assessment & Plan Note (Signed)
On thyroid replacement.  Follow tsh.  

## 2019-06-27 NOTE — Assessment & Plan Note (Signed)
Recent labs revealed worsening renal function.  Avoid antiinflammatories.  Get better control of blood pressure and sugars.  Recheck today.  Discussed need for renal ultrasound.

## 2019-06-27 NOTE — Assessment & Plan Note (Signed)
Sugars have been poorly controlled. Not checking his sugars regularly.  Discussed diet and exercise.  Discussed referral to endocrinology.

## 2019-06-27 NOTE — Assessment & Plan Note (Signed)
On lipitor.  Low cholesterol diet and exercise.  Follow lipid panel and liver function tests.   

## 2019-06-27 NOTE — Assessment & Plan Note (Signed)
Needs appt with pulmonary regarding further evaluation for sleep apnea.  Discussed again with him today.

## 2019-06-30 ENCOUNTER — Other Ambulatory Visit: Payer: Self-pay

## 2019-06-30 ENCOUNTER — Ambulatory Visit
Admission: RE | Admit: 2019-06-30 | Discharge: 2019-06-30 | Disposition: A | Discharge status: Home / Self Care | Payer: Managed Care, Other (non HMO) | Source: Ambulatory Visit | Attending: Internal Medicine | Admitting: Internal Medicine

## 2019-06-30 DIAGNOSIS — N1832 Chronic kidney disease, stage 3b: Secondary | ICD-10-CM | POA: Diagnosis present

## 2019-06-30 DIAGNOSIS — R944 Abnormal results of kidney function studies: Secondary | ICD-10-CM | POA: Insufficient documentation

## 2019-07-01 ENCOUNTER — Encounter: Payer: Self-pay | Admitting: Internal Medicine

## 2019-07-10 LAB — CUP PACEART REMOTE DEVICE CHECK
Date Time Interrogation Session: 20201114200749
Implantable Pulse Generator Implant Date: 20190604

## 2019-07-12 ENCOUNTER — Ambulatory Visit (INDEPENDENT_AMBULATORY_CARE_PROVIDER_SITE_OTHER): Payer: Managed Care, Other (non HMO) | Admitting: *Deleted

## 2019-07-12 DIAGNOSIS — G459 Transient cerebral ischemic attack, unspecified: Secondary | ICD-10-CM | POA: Diagnosis not present

## 2019-07-15 ENCOUNTER — Encounter: Payer: Self-pay | Admitting: Pharmacist

## 2019-07-15 ENCOUNTER — Ambulatory Visit: Payer: Managed Care, Other (non HMO) | Admitting: Pharmacist

## 2019-07-15 DIAGNOSIS — E11319 Type 2 diabetes mellitus with unspecified diabetic retinopathy without macular edema: Secondary | ICD-10-CM

## 2019-07-15 DIAGNOSIS — Z8673 Personal history of transient ischemic attack (TIA), and cerebral infarction without residual deficits: Secondary | ICD-10-CM

## 2019-07-15 MED ORDER — OZEMPIC (0.25 OR 0.5 MG/DOSE) 2 MG/1.5ML ~~LOC~~ SOPN: 0.5000 mg | PEN_INJECTOR | SUBCUTANEOUS | 2 refills | Status: DC

## 2019-07-15 MED ORDER — ASPIRIN 81 MG PO TBEC: DELAYED_RELEASE_TABLET | ORAL | 1 refills | Status: DC

## 2019-07-15 NOTE — Progress Notes (Signed)
Reviewed information.  Agree with assessment and plan.  Renal function worsened on chlorthalidone.  Has had increased lower extremity swelling.  Hold on amlodipine for now.  Follow pressures.    Dr Nicki Reaper

## 2019-07-15 NOTE — Chronic Care Management (AMB) (Signed)
Chronic Care Management   Note  07/15/2019 Name: Gregory Crane MRN: [id number] DOB: 09/26/1970   Subjective:  Gregory Crane is a 47 y.o. year old male who is a primary care patient of [name], [name], MD. The CCM team was consulted for assistance with chronic disease management and care coordination needs.    Contacted patient telephonically for medication management review.   Review of patient status, including review of consultants reports, laboratory and other test data, was performed as part of comprehensive evaluation and provision of chronic care management services.   Objective:  Lab Results  Component Value Date   CREATININE 2.01 (H) 06/24/2019   CREATININE 1.78 (H) 06/08/2019   CREATININE 1.65 (H) 05/26/2019    Lab Results  Component Value Date   HGBA1C 7.9 (H) 06/08/2019       Component Value Date/Time   CHOL 138 06/08/2019 1000   TRIG 152 (H) 06/08/2019 1000   HDL 36 (L) 06/08/2019 1000   CHOLHDL 3.8 06/08/2019 1000   CHOLHDL 3.6 04/10/2018 0520   VLDL 23 04/10/2018 0520   LDLCALC 75 06/08/2019 1000    Clinical ASCVD: Yes  The ASCVD Risk score (Goff DC Jr., et al., 2013) failed to calculate for the following reasons:   The patient has a prior MI or stroke diagnosis    BP Readings from Last 3 Encounters:  06/24/19 (!) 152/90  06/08/19 (!) 194/82  05/30/19 (!) 168/92    No Known Allergies  Medications Reviewed Today    Reviewed by [name], [name], MD (Physician) on 06/27/19 at 2033  Med List Status: <None>  Medication Order Taking? Sig Documenting Provider Last Dose Status Informant  amLODipine (NORVASC) 5 MG tablet [id number] No Take 1 tablet (5 mg total) by mouth daily. [name], [name], MD Taking Active   aspirin (ASPIRIN LOW DOSE) 81 MG EC tablet [id number] No TAKE 1 TABLET(81 MG) BY MOUTH DAILY [name], [name], MD Taking Active   atorvastatin (LIPITOR) 80 MG tablet [id number] No Take 1 tablet (80 mg total) by mouth daily. Klein, Steven C, MD  Taking Active   chlorthalidone (HYGROTON) 25 MG tablet [id number] No Take 1 tablet (25 mg total) by mouth daily. [name], [name], MD Taking Active   clopidogrel (PLAVIX) 75 MG tablet [id number] No TAKE 1 TABLET(75 MG) BY MOUTH DAILY [name], [name], MD Taking Active   cyanocobalamin (,VITAMIN B-12,) 1000 MCG/ML injection [id number]  Inject 1 mL into the skin once a week for 3 weeks then once every 30 days. [name], [name], MD  Active   DULoxetine (CYMBALTA) 20 MG capsule [id number] No Take 20 mg by mouth daily.  [provider] Taking Active Spouse/Significant Other  glucose blood (FREESTYLE LITE) test strip [id number] No Check blood sugars twice a day (Dx. 250.02) [name], [name], MD Taking Active Spouse/Significant Other  HUMALOG 100 UNIT/ML injection [id number] No INJECT 4 TO 6 UNITS UNDER THE SKIN THREE TIMES DAILY [name], [name], MD Taking Active   hydrALAZINE (APRESOLINE) 10 MG tablet [id number]  Take 1 tablet (10 mg total) by mouth 3 (three) times daily. [name], [name], MD  Active   Insulin Syringe-Needle U-100 (INSULIN SYRINGE .5CC/30GX5/16") 30G X 5/16" 0.5 ML MISC [id number]  USE AS DIRECTED WITH INSULIN [name], [name], MD  Active   LANTUS 100 UNIT/ML injection [id number] No INJECT 15 UNITS UNDER THE SKIN EVERY NIGHT AT BEDTIME [name], [name], MD Taking Active Spouse/Significant Other  levothyroxine (SYNTHROID) 125 MCG tablet [id number] No Take 1 tablet (125 mcg total) by mouth daily before breakfast. [name],   Randell Patient, MD Taking Active   losartan (COZAAR) 100 MG tablet [id number] No Take 1 tablet (100 mg total) by mouth daily. [name], MD Taking Active   metFORMIN (GLUCOPHAGE) 500 MG tablet [id number] No Take 1 tablet (500 mg total) by mouth daily with breakfast. [name], MD Taking Active   [facility] LANCETS [place]ron [id number] No 1 applicator by Other route 4 (four) times daily. [provider] Taking Active Spouse/Significant Other  pregabalin (LYRICA) 100 MG  capsule [id number] No Take 1 capsule (100 mg total) by mouth 3 (three) times daily. [name], MD Taking Active   Syringe/Needle, Disp, (SYRINGE 3CC/25GX1") 25G X 1" 3 ML MISC [id number]  Use as directed to administer b12 injections. [name], MD  Active            Assessment:   Goals Addressed            This Visit's Progress     Patient Stated   . PharmD "I want to work on my blood sugars" (pt-stated)       Current Barriers:  . Diabetes: uncontrolled; most recent D9M 4.2%; complicated by CKD, hx CVA o Medication options limited by renal function; eGFR generally hovering in 30-40s  . Current antihyperglycemic regimen: Lantus 15 units daily, Humalog 5-8 units TID with meals; reports that he d/c metformin d/t the continued issues w/ renal function . Current blood glucose readings: has not been checking often recently; gauges Humalog dose based on his anticipated carbohydrate load . Cardiovascular risk reduction: o Current hypertensive regimen: amlodipine 5 mg QAM, losartan 100 mg QAM, hydralazine 10 mg TID- chlorthalidone recently held d/t rise in Scr - Reports home readings in 150s/80s; confirms he held chlorthalidone o Current hyperlipidemia regimen: atorvastatin 80 mg daily; last LDL 75 o Current antiplatelet regimen: ASA 81 mg, clopidogrel 75 mg daily; last possible stroke was 03/2018, but this was determine to be related to Bell's palsy  Pharmacist Clinical Goal(s):  Marland Kitchen Over the next 90 days, patient will work with PharmD and primary care provider to address optimized glycemic management  Interventions: . Comprehensive medication review performed, medication list updated in electronic medical record . Discussed goal A1c, goal fasting glucose, and goal 2 hour post prandial glucose . Discussed importance of adherence to SMBG to determine optimal medication management . Discussed GLP1; weight loss and ASCVD risk reduction benefit, as well as GI side effects. Patient  agreeable to start Ozempic 0.25 mg once weekly x 4 weeks, then increase to 0.5 mg weekly. Recommended he reduce Lantus to 10 units daily and hold Humalog at first to prevent hypoglycemia. If patient responds well, may be able to d/c insulin therapy moving forward.  . Discussed copay cards, and directed to [facility] website if copay is expensive . Discussed HTN goal. If renal function appropriate to reinitiate chlorthalidone or use spironolactone, that would be optimal. Otherwise, consider increasing amlodipine w/ counseling of worsened LEE vs increasing hydralazine dose.  . Given ASCVD hx, recommend goal LDL <70. Will consider addition of ezetimibe in the future.  . Moving forward, will address intended duration of DAPT. Patient does have hx multiple CVAs.   Patient Self Care Activities:  . Patient will check blood glucose BID-TID , document, and provide at future appointments . Patient will take medications as prescribed . Patient will report any questions or concerns to provider   Initial goal documentation        Plan: - Patient to f/u with Dr. Nicki Reaper in ~3-4 weeks.  Scheduled outreach with me 4-5 weeks after that for continued medication management support   Catie [name], PharmD, [place] Pharmacist [place] [phone]

## 2019-07-15 NOTE — Patient Instructions (Signed)
Visit Information  Goals Addressed            This Visit's Progress     Patient Stated   . PharmD "I want to work on my blood sugars" (pt-stated)       Current Barriers:  . Diabetes: uncontrolled; most recent S9J 2.4%; complicated by CKD, hx CVA o Medication options limited by renal function; eGFR generally hovering in 30-40s  . Current antihyperglycemic regimen: Lantus 15 units daily, Humalog 5-8 units TID with meals; reports that he d/c metformin d/t the continued issues w/ renal function . Current blood glucose readings: has not been checking often recently; gauges Humalog dose based on his anticipated carbohydrate load . Cardiovascular risk reduction: o Current hypertensive regimen: amlodipine 5 mg QAM, losartan 100 mg QAM, hydralazine 10 mg TID- chlorthalidone recently held d/t rise in Scr - Reports home readings in 150s/80s; confirms he held chlorthalidone o Current hyperlipidemia regimen: atorvastatin 80 mg daily; last LDL 75 o Current antiplatelet regimen: ASA 81 mg, clopidogrel 75 mg daily; last possible stroke was 03/2018, but this was determine to be related to Bell's palsy  Pharmacist Clinical Goal(s):  Marland Kitchen Over the next 90 days, patient will work with PharmD and primary care provider to address optimized glycemic management  Interventions: . Comprehensive medication review performed, medication list updated in electronic medical record . Discussed goal A1c, goal fasting glucose, and goal 2 hour post prandial glucose . Discussed importance of adherence to SMBG to determine optimal medication management . Discussed GLP1; weight loss and ASCVD risk reduction benefit, as well as GI side effects. Patient agreeable to start Ozempic 0.25 mg once weekly x 4 weeks, then increase to 0.5 mg weekly. Recommended he reduce Lantus to 10 units daily and hold Humalog at first to prevent hypoglycemia. If patient responds well, may be able to d/c insulin therapy moving forward.  . Discussed  copay cards, and directed to [facility] website if copay is expensive . Discussed HTN goal. If renal function appropriate to reinitiate chlorthalidone or use spironolactone, that would be optimal. Otherwise, consider increasing amlodipine w/ counseling of worsened LEE vs increasing hydralazine dose.  . Given ASCVD hx, recommend goal LDL <70. Will consider addition of ezetimibe in the future.  . Moving forward, will address intended duration of DAPT. Patient does have hx multiple CVAs.   Patient Self Care Activities:  . Patient will check blood glucose BID-TID , document, and provide at future appointments . Patient will take medications as prescribed . Patient will report any questions or concerns to provider   Initial goal documentation        The patient verbalized understanding of instructions provided today and declined a print copy of patient instruction materials.   Plan: - Patient to f/u with Dr. Nicki Reaper in ~3-4 weeks. Scheduled outreach with me 4-5 weeks after that for continued medication management support   Catie [name], PharmD, [place] Pharmacist [place] [phone]

## 2019-07-27 ENCOUNTER — Other Ambulatory Visit: Payer: Self-pay

## 2019-07-27 MED ORDER — ATORVASTATIN CALCIUM 80 MG PO TABS: 80.0000 mg | ORAL_TABLET | Freq: Every day | ORAL | 0 refills | Status: DC

## 2019-07-27 MED ORDER — LOSARTAN POTASSIUM 100 MG PO TABS: 100.0000 mg | ORAL_TABLET | Freq: Every day | ORAL | 3 refills | Status: DC

## 2019-07-28 ENCOUNTER — Ambulatory Visit (INDEPENDENT_AMBULATORY_CARE_PROVIDER_SITE_OTHER): Payer: Managed Care, Other (non HMO) | Admitting: Internal Medicine

## 2019-07-28 ENCOUNTER — Encounter: Payer: Self-pay | Admitting: Internal Medicine

## 2019-07-28 ENCOUNTER — Other Ambulatory Visit: Payer: Self-pay

## 2019-07-28 VITALS — BP 140/88 | HR 86 | Temp 97.5°F | Ht 72.0 in | Wt 314.8 lb

## 2019-07-28 DIAGNOSIS — G4719 Other hypersomnia: Secondary | ICD-10-CM | POA: Diagnosis not present

## 2019-07-28 NOTE — Patient Instructions (Addendum)
MEDICATION ADJUSTMENTS/LABS AND TESTS ORDERED: Home sleep study   COVID-19 COVID-19 is a respiratory infection that is caused by a virus called severe acute respiratory syndrome coronavirus 2 (SARS-CoV-2). The disease is also known as coronavirus disease or novel coronavirus. In some people, the virus may not cause any symptoms. In others, it may cause a serious infection. The infection can get worse quickly and can lead to complications, such as:  Pneumonia, or infection of the lungs.  Acute respiratory distress syndrome or ARDS. This is fluid build-up in the lungs.  Acute respiratory failure. This is a condition in which there is not enough oxygen passing from the lungs to the body.  Sepsis or septic shock. This is a serious bodily reaction to an infection.  Blood clotting problems.  Secondary infections due to bacteria or fungus. The virus that causes COVID-19 is contagious. This means that it can spread from person to person through droplets from coughs and sneezes (respiratory secretions). What are the causes? This illness is caused by a virus. You may catch the virus by:  Breathing in droplets from an infected person's cough or sneeze.  Touching something, like a table or a doorknob, that was exposed to the virus (contaminated) and then touching your mouth, nose, or eyes. What increases the risk? Risk for infection You are more likely to be infected with this virus if you:  Live in or travel to an area with a COVID-19 outbreak.  Come in contact with a sick person who recently traveled to an area with a COVID-19 outbreak.  Provide care for or live with a person who is infected with COVID-19. Risk for serious illness You are more likely to become seriously ill from the virus if you:  Are 22 years of age or older.  Have a long-term disease that lowers your body's ability to fight infection (immunocompromised).  Live in a nursing home or long-term care facility.  Have a  long-term (chronic) disease such as: ? Chronic lung disease, including chronic obstructive pulmonary disease or asthma ? Heart disease. ? Diabetes. ? Chronic kidney disease. ? Liver disease.  Are obese. What are the signs or symptoms? Symptoms of this condition can range from mild to severe. Symptoms may appear any time from 2 to 14 days after being exposed to the virus. They include:  A fever.  A cough.  Difficulty breathing.  Chills.  Muscle pains.  A sore throat.  Loss of taste or smell. Some people may also have stomach problems, such as nausea, vomiting, or diarrhea. Other people may not have any symptoms of COVID-19. How is this diagnosed? This condition may be diagnosed based on:  Your signs and symptoms, especially if: ? You live in an area with a COVID-19 outbreak. ? You recently traveled to or from an area where the virus is common. ? You provide care for or live with a person who was diagnosed with COVID-19.  A physical exam.  Lab tests, which may include: ? A nasal swab to take a sample of fluid from your nose. ? A throat swab to take a sample of fluid from your throat. ? A sample of mucus from your lungs (sputum). ? Blood tests.  Imaging tests, which may include, X-rays, CT scan, or ultrasound. How is this treated? At present, there is no medicine to treat COVID-19. Medicines that treat other diseases are being used on a trial basis to see if they are effective against COVID-19. Your health care provider will talk  with you about ways to treat your symptoms. For most people, the infection is mild and can be managed at home with rest, fluids, and over-the-counter medicines. Treatment for a serious infection usually takes places in a hospital intensive care unit (ICU). It may include one or more of the following treatments. These treatments are given until your symptoms improve.  Receiving fluids and medicines through an IV.  Supplemental oxygen. Extra  oxygen is given through a tube in the nose, a face mask, or a hood.  Positioning you to lie on your stomach (prone position). This makes it easier for oxygen to get into the lungs.  Continuous positive airway pressure (CPAP) or bi-level positive airway pressure (BPAP) machine. This treatment uses mild air pressure to keep the airways open. A tube that is connected to a motor delivers oxygen to the body.  Ventilator. This treatment moves air into and out of the lungs by using a tube that is placed in your windpipe.  Tracheostomy. This is a procedure to create a hole in the neck so that a breathing tube can be inserted.  Extracorporeal membrane oxygenation (ECMO). This procedure gives the lungs a chance to recover by taking over the functions of the heart and lungs. It supplies oxygen to the body and removes carbon dioxide. Follow these instructions at home: Lifestyle  If you are sick, stay home except to get medical care. Your health care provider will tell you how long to stay home. Call your health care provider before you go for medical care.  Rest at home as told by your health care provider.  Do not use any products that contain nicotine or tobacco, such as cigarettes, e-cigarettes, and chewing tobacco. If you need help quitting, ask your health care provider.  Return to your normal activities as told by your health care provider. Ask your health care provider what activities are safe for you. General instructions  Take over-the-counter and prescription medicines only as told by your health care provider.  Drink enough fluid to keep your urine pale yellow.  Keep all follow-up visits as told by your health care provider. This is important. How is this prevented?  There is no vaccine to help prevent COVID-19 infection. However, there are steps you can take to protect yourself and others from this virus. To protect yourself:   Do not travel to areas where COVID-19 is a risk. The  areas where COVID-19 is reported change often. To identify high-risk areas and travel restrictions, check the CDC travel website: FatFares.com.br  If you live in, or must travel to, an area where COVID-19 is a risk, take precautions to avoid infection. ? Stay away from people who are sick. ? Wash your hands often with soap and water for 20 seconds. If soap and water are not available, use an alcohol-based hand sanitizer. ? Avoid touching your mouth, face, eyes, or nose. ? Avoid going out in public, follow guidance from your state and local health authorities. ? If you must go out in public, wear a cloth face covering or face mask. ? Disinfect objects and surfaces that are frequently touched every day. This may include:  Counters and tables.  Doorknobs and light switches.  Sinks and faucets.  Electronics, such as phones, remote controls, keyboards, computers, and tablets. To protect others: If you have symptoms of COVID-19, take steps to prevent the virus from spreading to others.  If you think you have a COVID-19 infection, contact your health care provider right away.  Tell your health care team that you think you may have a COVID-19 infection.  Stay home. Leave your house only to seek medical care. Do not use public transport.  Do not travel while you are sick.  Wash your hands often with soap and water for 20 seconds. If soap and water are not available, use alcohol-based hand sanitizer.  Stay away from other members of your household. Let healthy household members care for children and pets, if possible. If you have to care for children or pets, wash your hands often and wear a mask. If possible, stay in your own room, separate from others. Use a different bathroom.  Make sure that all people in your household wash their hands well and often.  Cough or sneeze into a tissue or your sleeve or elbow. Do not cough or sneeze into your hand or into the air.  Wear a cloth  face covering or face mask. Where to find more information  Centers for Disease Control and Prevention: PurpleGadgets.be  World Health Organization: https://www.castaneda.info/ Contact a health care provider if:  You live in or have traveled to an area where COVID-19 is a risk and you have symptoms of the infection.  You have had contact with someone who has COVID-19 and you have symptoms of the infection. Get help right away if:  You have trouble breathing.  You have pain or pressure in your chest.  You have confusion.  You have bluish lips and fingernails.  You have difficulty waking from sleep.  You have symptoms that get worse. These symptoms may represent a serious problem that is an emergency. Do not wait to see if the symptoms will go away. Get medical help right away. Call your local emergency services (911 in the [place] Do not drive yourself to the hospital. Let the emergency medical personnel know if you think you have COVID-19. Summary  COVID-19 is a respiratory infection that is caused by a virus. It is also known as coronavirus disease or novel coronavirus. It can cause serious infections, such as pneumonia, acute respiratory distress syndrome, acute respiratory failure, or sepsis.  The virus that causes COVID-19 is contagious. This means that it can spread from person to person through droplets from coughs and sneezes.  You are more likely to develop a serious illness if you are 52 years of age or older, have a weak immunity, live in a nursing home, or have chronic disease.  There is no medicine to treat COVID-19. Your health care provider will talk with you about ways to treat your symptoms.  Take steps to protect yourself and others from infection. Wash your hands often and disinfect objects and surfaces that are frequently touched every day. Stay away from people who are sick and wear a mask if you are sick. This information  is not intended to replace advice given to you by your health care provider. Make sure you discuss any questions you have with your health care provider. Document Released: 09/17/2018 Document Revised: 01/07/2019 Document Reviewed: 09/17/2018 Elsevier Patient Education  2020 [employer].

## 2019-07-28 NOTE — Progress Notes (Signed)
Name: Gregory Crane MRN: [id number] DOB: [birth year]     CONSULTATION DATE: 07/28/2019  REFERRING MD : Nicki Reaper  CHIEF COMPLAINT: excessive daytime sleepiness   HISTORY OF PRESENT ILLNESS:  Patient is seen today for problems and issues with sleep related to excessive daytime sleepiness Patient  has been having sleep problems for many years Patient has been having excessive daytime sleepiness for a long time Patient has been having extreme fatigue and tiredness, lack of energy +  very Loud snoring every night + struggling breathe at night and gasps for air   Discussed sleep data and reviewed with patient.  Encouraged proper weight management.  Discussed driving precautions and its relationship with hypersomnolence.  Discussed operating dangerous equipment and its relationship with hypersomnolence.  Discussed sleep hygiene, and benefits of a fixed sleep waked time.  The importance of getting eight or more hours of sleep discussed with patient.  Discussed limiting the use of the computer and television before bedtime.  Decrease naps during the day, so night time sleep will become enhanced.  Limit caffeine, and sleep deprivation.  HTN, stroke, and heart failure are potential risk factors.    EPWORTH SLEEP SCORE 12  Patient had previous history of 3 strokes + hypertension +diabetes Currently weighs 315 pounds  No evidence of heart failure at this time No evidence or signs of infection at this time No respiratory distress No fevers, chills, nausea, vomiting, diarrhea No evidence of lower extremity edema No evidence hemoptysis    PAST MEDICAL HISTORY :   has a past medical history of Allergy, Bell's palsy, Diabetes mellitus without complication ([place]), Hypertension, Hypothyroidism, Kidney stones, and Stroke ([place]).  has a past surgical history that includes No past surgeries; Lumbar puncture; TEE without cardioversion (N/A, 01/07/2018); and LOOP RECORDER INSERTION (N/A,  01/27/2018). Prior to Admission medications   Medication Sig Start Date End Date Taking? Authorizing Provider  amLODipine (NORVASC) 5 MG tablet Take 1 tablet (5 mg total) by mouth daily. 04/12/19   [name], MD  aspirin (ASPIRIN LOW DOSE) 81 MG EC tablet TAKE 1 TABLET(81 MG) BY MOUTH DAILY 07/15/19   [name], MD  atorvastatin (LIPITOR) 80 MG tablet Take 1 tablet (80 mg total) by mouth daily. 07/27/19   [name], MD  chlorthalidone (HYGROTON) 25 MG tablet Take 1 tablet (25 mg total) by mouth daily. Patient not taking: Reported on 07/15/2019 05/26/19   [name], MD  clopidogrel (PLAVIX) 75 MG tablet TAKE 1 TABLET(75 MG) BY MOUTH DAILY 01/28/19   [name], MD  cyanocobalamin (,VITAMIN B-12,) 1000 MCG/ML injection Inject 1 mL into the skin once a week for 3 weeks then once every 30 days. 06/17/19   [name], MD  DULoxetine (CYMBALTA) 20 MG capsule Take 20 mg by mouth daily.  01/09/18   [provider]  glucose blood (FREESTYLE LITE) test strip Check blood sugars twice a day (Dx. 250.02) 04/18/17   [name], MD  HUMALOG 100 UNIT/ML injection INJECT 4 TO 6 UNITS UNDER THE SKIN THREE TIMES DAILY 11/16/18   [name], MD  hydrALAZINE (APRESOLINE) 10 MG tablet Take 1 tablet (10 mg total) by mouth 3 (three) times daily. 06/24/19   [name], MD  Insulin Syringe-Needle U-100 (INSULIN SYRINGE .5CC/30GX5/16") 30G X 5/16" 0.5 ML MISC USE AS DIRECTED WITH INSULIN 06/21/19   [name], MD  LANTUS 100 UNIT/ML injection INJECT 15 UNITS UNDER THE SKIN EVERY NIGHT AT BEDTIME 09/10/18   [name], MD  levothyroxine (SYNTHROID) 125  MCG tablet Take 1 tablet (125 mcg total) by mouth daily before breakfast. 06/02/19   [name], MD  losartan (COZAAR) 100 MG tablet Take 1 tablet (100 mg total) by mouth daily. 07/27/19   [name], MD  [facility] DELICA LANCETS FINE MISC 1 applicator by Other route 4 (four) times daily. 10/06/17   [provider]  pregabalin (LYRICA) 100 MG capsule Take 1 capsule (100 mg total) by mouth 3 (three) times daily. 06/07/19   [name], MD  Semaglutide,0.25 or 0.5MG /DOS, (OZEMPIC, 0.25 OR 0.5 MG/DOSE,) 2 MG/1.5ML SOPN Inject 0.5 mg into the skin once a week. Inject 0.25 mg once weekly for 4 weeks, then increase to 0.5 mg 07/15/19   [name], MD  Syringe/Needle, Disp, (SYRINGE 3CC/25GX1") 25G X 1" 3 ML MISC Use as directed to administer b12 injections. 06/21/19   [name], MD   No Known Allergies  FAMILY HISTORY:  family history includes Arthritis in his maternal grandmother; Breast cancer in his mother; Diabetes in his father, maternal grandmother, and sister. SOCIAL HISTORY:  reports that he has never smoked. He has never used smokeless tobacco. He reports that he does not drink alcohol or use drugs.  REVIEW OF SYSTEMS:   Constitutional: Negative for fever, chills, weight loss, malaise/fatigue and diaphoresis.  HENT: Negative for hearing loss, ear pain, nosebleeds, congestion, sore throat, neck pain, tinnitus and ear discharge.   Eyes: Negative for blurred vision, double vision, photophobia, pain, discharge and redness.  Respiratory: Negative for cough, hemoptysis, sputum production, shortness of breath, wheezing and stridor.   Cardiovascular: Negative for chest pain, palpitations, orthopnea, claudication, leg swelling and PND.  Gastrointestinal: Negative for heartburn, nausea, vomiting, abdominal pain, diarrhea, constipation, blood in stool and melena.  Genitourinary: Negative for dysuria, urgency, frequency, hematuria and flank pain.  Musculoskeletal: Negative for myalgias, back pain, joint pain and falls.  Skin: Negative for itching and rash.  Neurological: Negative for dizziness, tingling, tremors, sensory change, speech change, focal weakness, seizures, loss of consciousness, weakness and headaches.  Endo/Heme/Allergies: Negative for environmental allergies and  polydipsia. Does not bruise/bleed easily.  ALL OTHER ROS ARE NEGATIVE  BP 140/88 (BP Location: Left Arm, Patient Position: Sitting, Cuff Size: Large)   Pulse 86   Temp (!) 97.5 F (36.4 C) (Temporal)   Ht 6' (1.829 m)   Wt (!) 314 lb 12.8 oz (142.8 kg)   SpO2 96% Comment: RA  BMI 42.69 kg/m   Physical Examination:   GENERAL:NAD, no fevers, chills, no weakness no fatigue HEAD: Normocephalic, atraumatic.  EYES: Pupils equal, round, reactive to light. Extraocular muscles intact. No scleral icterus.  MOUTH: Moist mucosal membrane.   EAR, NOSE, THROAT: Clear without exudates. No external lesions.  NECK: Supple. No thyromegaly. No nodules. No JVD.  PULMONARY:CTA B/L no wheezes, no crackles, no rhonchi CARDIOVASCULAR: S1 and S2. Regular rate and rhythm. No murmurs, rubs, or gallops. No edema.  GASTROINTESTINAL: Soft, nontender, nondistended. No masses. Positive bowel sounds.  MUSCULOSKELETAL: No swelling, clubbing, or edema. Range of motion full in all extremities.  NEUROLOGIC: Cranial nerves II through XII are intact. No gross focal neurological deficits.  SKIN: No ulceration, lesions, rashes, or cyanosis. Skin warm and dry. Turgor intact.  PSYCHIATRIC: Mood, affect within normal limits. The patient is awake, alert and oriented x 3. Insight, judgment intact.       ASSESSMENT AND PLAN SYNOPSIS  Excessive daytime sleepiness in the setting of morbid obesity with witnessed apneas and snoring most likely related to underlying sleep  apnea Patient will need sleep study for definitive diagnosis  Hypertension - Sleep apnea can contribute to hypertension, therefore treatment of sleep apnea if it is diagnosed is important part of hypertension management.   Obesity -recommend significant weight loss -recommend changing diet  Deconditioned state -Recommend increased daily activity and exercise    COVID-19 EDUCATION: The signs and symptoms of COVID-19 were discussed with the patient  and how to seek care for testing.  The importance of social distancing was discussed today. Hand Washing Techniques and avoid touching face was advised.     MEDICATION ADJUSTMENTS/LABS AND TESTS ORDERED: Home sleep study Recommend weight loss   CURRENT MEDICATIONS REVIEWED AT LENGTH WITH PATIENT TODAY   Patient satisfied with Plan of action and management. All questions answered  Follow up in 3 months   Bracen Schum Patricia Pesa, M.D.  [facility]  Medical Director [place] Director [facility] Cardio-Pulmonary Department

## 2019-07-30 ENCOUNTER — Ambulatory Visit (INDEPENDENT_AMBULATORY_CARE_PROVIDER_SITE_OTHER): Payer: Managed Care, Other (non HMO) | Admitting: Internal Medicine

## 2019-07-30 ENCOUNTER — Other Ambulatory Visit: Payer: Self-pay

## 2019-07-30 ENCOUNTER — Encounter: Payer: Self-pay | Admitting: Internal Medicine

## 2019-07-30 VITALS — Ht 72.0 in | Wt 314.0 lb

## 2019-07-30 DIAGNOSIS — R400 Somnolence: Secondary | ICD-10-CM | POA: Diagnosis not present

## 2019-07-30 DIAGNOSIS — N1832 Chronic kidney disease, stage 3b: Secondary | ICD-10-CM

## 2019-07-30 DIAGNOSIS — I10 Essential (primary) hypertension: Secondary | ICD-10-CM

## 2019-07-30 DIAGNOSIS — E11319 Type 2 diabetes mellitus with unspecified diabetic retinopathy without macular edema: Secondary | ICD-10-CM

## 2019-07-30 DIAGNOSIS — D649 Anemia, unspecified: Secondary | ICD-10-CM

## 2019-07-30 DIAGNOSIS — E039 Hypothyroidism, unspecified: Secondary | ICD-10-CM

## 2019-07-30 DIAGNOSIS — G629 Polyneuropathy, unspecified: Secondary | ICD-10-CM

## 2019-07-30 DIAGNOSIS — E785 Hyperlipidemia, unspecified: Secondary | ICD-10-CM

## 2019-07-30 DIAGNOSIS — Z794 Long term (current) use of insulin: Secondary | ICD-10-CM

## 2019-07-30 DIAGNOSIS — Z8673 Personal history of transient ischemic attack (TIA), and cerebral infarction without residual deficits: Secondary | ICD-10-CM

## 2019-07-30 MED ORDER — HYDRALAZINE HCL 25 MG PO TABS: 25.0000 mg | ORAL_TABLET | Freq: Three times a day (TID) | ORAL | 2 refills | Status: DC

## 2019-07-30 NOTE — Progress Notes (Signed)
Patient ID: Gregory Crane, male   DOB: 11-Sep-1970, 48 y.o.   MRN: [id number]   Virtual Visit via video Note  This visit type was conducted due to national recommendations for restrictions regarding the COVID-19 pandemic (e.g. social distancing).  This format is felt to be most appropriate for this patient at this time.  All issues noted in this document were discussed and addressed.  No physical exam was performed (except for noted visual exam findings with Video Visits).   I connected with Gregory Crane by a video enabled telemedicine application and verified that I am speaking with the correct person using two identifiers. Location patient: home Location provider: work  Persons participating in the virtual visit: patient, provider  I discussed the limitations, risks, security and privacy concerns of performing an evaluation and management service by video and the availability of in person appointments.  The patient expressed understanding and agreed to proceed.   Reason for visit: scheduled follow up.   HPI: He reports he is doing some better.  Saw pulmonary.  Planning for sleep study.  Last labs revealed CKD.  Normal renal ultrasound.  Needs f/u metabolic panel.  Due to see nephrology.  Discussed the need to avoid antiinflammatories.  Blood pressure remains elevated.  Still seeing readings - 176-160V systolic readings.  Off chlorthalidone.  Taking hydralazine.  Discussed increasing dose.  States sugars are better.  AM sugars 160 and during day - sugars 130s.  No chest pain.  Breathing stable.  No acid reflux.  No abdominal pain.  Bowels moving.     ROS: See pertinent positives and negatives per HPI.  Past Medical History:  Diagnosis Date  . Allergy   . Bell's palsy   . Diabetes mellitus without complication (HCC)    diet controlled  . Hypertension   . Hypothyroidism   . Kidney stones   . Stroke [facility])     Past Surgical History:  Procedure Laterality Date  . LOOP RECORDER  INSERTION N/A 01/27/2018   Procedure: LOOP RECORDER INSERTION;  Surgeon: [name], MD;  Location: [place] [facility];  Service: Cardiovascular;  Laterality: N/A;  . LUMBAR PUNCTURE     as child  . NO PAST SURGERIES    . TEE WITHOUT CARDIOVERSION N/A 01/07/2018   Procedure: TRANSESOPHAGEAL ECHOCARDIOGRAM (TEE);  Surgeon: Minna Merritts, MD;  Location: [facility] ORS;  Service: Cardiovascular;  Laterality: N/A;    Family History  Problem Relation Age of Onset  . Breast cancer Mother   . Diabetes Father   . Diabetes Sister   . Arthritis Maternal Grandmother   . Diabetes Maternal Grandmother     SOCIAL HX: reviewed.    Current Outpatient Medications:  .  amLODipine (NORVASC) 5 MG tablet, Take 1 tablet (5 mg total) by mouth daily., Disp: 30 tablet, Rfl: 1 .  aspirin (ASPIRIN LOW DOSE) 81 MG EC tablet, TAKE 1 TABLET(81 MG) BY MOUTH DAILY, Disp: 90 tablet, Rfl: 1 .  atorvastatin (LIPITOR) 80 MG tablet, Take 1 tablet (80 mg total) by mouth daily., Disp: 30 tablet, Rfl: 0 .  clopidogrel (PLAVIX) 75 MG tablet, TAKE 1 TABLET(75 MG) BY MOUTH DAILY, Disp: 90 tablet, Rfl: 2 .  cyanocobalamin (,VITAMIN B-12,) 1000 MCG/ML injection, Inject 1 mL into the skin once a week for 3 weeks then once every 30 days., Disp: 10 mL, Rfl: 0 .  DULoxetine (CYMBALTA) 20 MG capsule, Take 20 mg by mouth daily. , Disp: , Rfl: 3 .  glucose  blood (FREESTYLE LITE) test strip, Check blood sugars twice a day (Dx. 250.02), Disp: 200 each, Rfl: 1 .  HUMALOG 100 UNIT/ML injection, INJECT 4 TO 6 UNITS UNDER THE SKIN THREE TIMES DAILY, Disp: 30 mL, Rfl: 0 .  hydrALAZINE (APRESOLINE) 25 MG tablet, Take 1 tablet (25 mg total) by mouth 3 (three) times daily., Disp: 90 tablet, Rfl: 2 .  Insulin Syringe-Needle U-100 (INSULIN SYRINGE .5CC/30GX5/16") 30G X 5/16" 0.5 ML MISC, USE AS DIRECTED WITH INSULIN, Disp: 300 each, Rfl: 0 .  LANTUS 100 UNIT/ML injection, INJECT 15 UNITS UNDER THE SKIN EVERY NIGHT AT BEDTIME, Disp: 30 mL, Rfl: 0  .  levothyroxine (SYNTHROID) 125 MCG tablet, Take 1 tablet (125 mcg total) by mouth daily before breakfast., Disp: 90 tablet, Rfl: 0 .  losartan (COZAAR) 100 MG tablet, Take 1 tablet (100 mg total) by mouth daily., Disp: 30 tablet, Rfl: 3 .  ONETOUCH DELICA LANCETS FINE MISC, 1 applicator by Other route 4 (four) times daily., Disp: , Rfl: 0 .  pregabalin (LYRICA) 100 MG capsule, Take 1 capsule (100 mg total) by mouth 3 (three) times daily., Disp: 270 capsule, Rfl: 1 .  Semaglutide,0.25 or 0.'5MG'$ /DOS, (OZEMPIC, 0.25 OR 0.5 MG/DOSE,) 2 MG/1.5ML SOPN, Inject 0.5 mg into the skin once a week. Inject 0.25 mg once weekly for 4 weeks, then increase to 0.5 mg, Disp: 1 pen, Rfl: 2 .  Syringe/Needle, Disp, (SYRINGE 3CC/25GX1") 25G X 1" 3 ML MISC, Use as directed to administer b12 injections., Disp: 50 each, Rfl: 1  EXAM:  GENERAL: alert, oriented, appears well and in no acute distress  HEENT: atraumatic, conjunttiva clear, no obvious abnormalities on inspection of external nose and ears  NECK: normal movements of the head and neck  LUNGS: on inspection no signs of respiratory distress, breathing rate appears normal, no obvious gross SOB, gasping or wheezing  CV: no obvious cyanosis  PSYCH/NEURO: pleasant and cooperative, no obvious depression or anxiety, speech and thought processing grossly intact  ASSESSMENT AND PLAN:  Discussed the following assessment and plan:  Anemia Follow cbc.   Chronic kidney disease (CKD), stage III (moderate) Worsening renal function recently.  Chlorthalidone stopped.  Getting better control of sugars.  Trying to get blood pressure under better control.  Avoid antiinflammatories.  Follow metabolic panel.  Due to f/u with nephrology.   Daytime somnolence Saw pulmonary. Planning for sleep study.   Diabetes Sugars have been poorly controlled.  Discussed low carb diet and exercise.  Follow met b and a1c.  Sugars appear to be trending down.    Essential hypertension  Blood pressure remains elevated.  Increase hydralazine to '25mg'$  tid.  Follow pressures. Follow metabolic panel.   History of CVA (cerebrovascular accident) On aspirin and plavix.    Hyperlipidemia On lipitor.  Low cholesterol diet and exercise.  Follow lipid panel and liver function tests.    Hypothyroidism On thyroid replacement.  Follow tsh.   Neuropathy ([place]) On lyrica.     I discussed the assessment and treatment plan with the patient. The patient was provided an opportunity to ask questions and all were answered. The patient agreed with the plan and demonstrated an understanding of the instructions.   The patient was advised to call back or seek an in-person evaluation if the symptoms worsen or if the condition fails to improve as anticipated.    [name], MD

## 2019-08-01 ENCOUNTER — Encounter: Payer: Self-pay | Admitting: Internal Medicine

## 2019-08-01 NOTE — Assessment & Plan Note (Signed)
Sugars have been poorly controlled.  Discussed low carb diet and exercise.  Follow met b and a1c.  Sugars appear to be trending down.

## 2019-08-01 NOTE — Assessment & Plan Note (Signed)
On lipitor.  Low cholesterol diet and exercise.  Follow lipid panel and liver function tests.   

## 2019-08-01 NOTE — Assessment & Plan Note (Signed)
On aspirin and plavix.  

## 2019-08-01 NOTE — Assessment & Plan Note (Signed)
Saw pulmonary. Planning for sleep study.

## 2019-08-01 NOTE — Assessment & Plan Note (Signed)
Worsening renal function recently.  Chlorthalidone stopped.  Getting better control of sugars.  Trying to get blood pressure under better control.  Avoid antiinflammatories.  Follow metabolic panel.  Due to f/u with nephrology.

## 2019-08-01 NOTE — Assessment & Plan Note (Signed)
Follow cbc.  

## 2019-08-01 NOTE — Assessment & Plan Note (Signed)
On thyroid replacement.  Follow tsh.  

## 2019-08-01 NOTE — Assessment & Plan Note (Signed)
On lyrica

## 2019-08-01 NOTE — Assessment & Plan Note (Signed)
Blood pressure remains elevated.  Increase hydralazine to 25mg  tid.  Follow pressures. Follow metabolic panel.

## 2019-08-06 ENCOUNTER — Encounter: Payer: Self-pay | Admitting: Internal Medicine

## 2019-08-08 IMAGING — US US EXTREM LOW VENOUS*L*
1 series · 13 of 24 positions shown · non-contrast
Comparison: None.

CLINICAL DATA: Left lower extremity pain and edema for the past 3
days. Evaluate for DVT.



[Series 1: us extrem low venous*left* · 0.10mm/px · 13 of 31 slices shown]
[im 1/31]
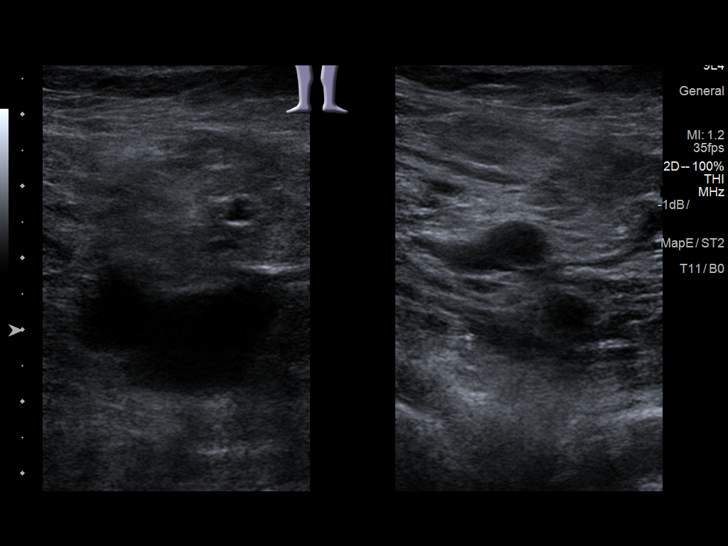
[im 3/31]
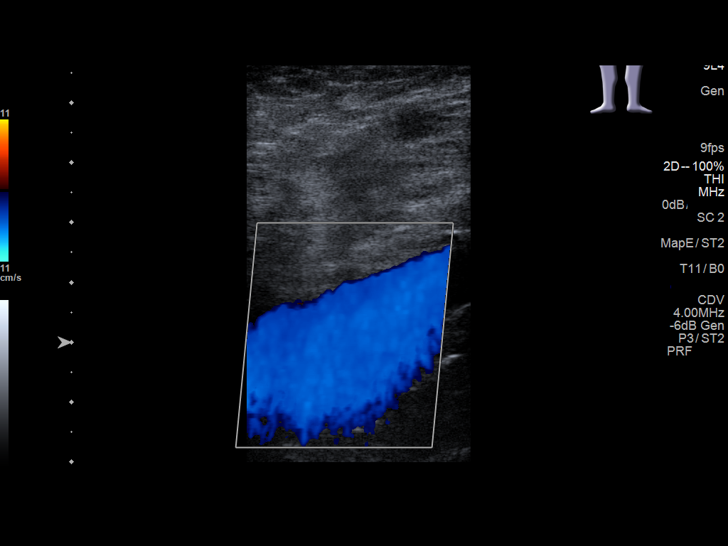
[im 6/31]
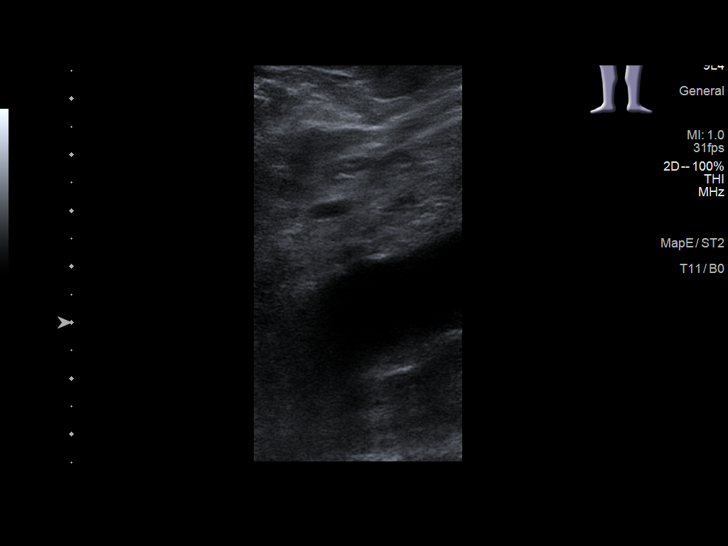
[im 8/31]
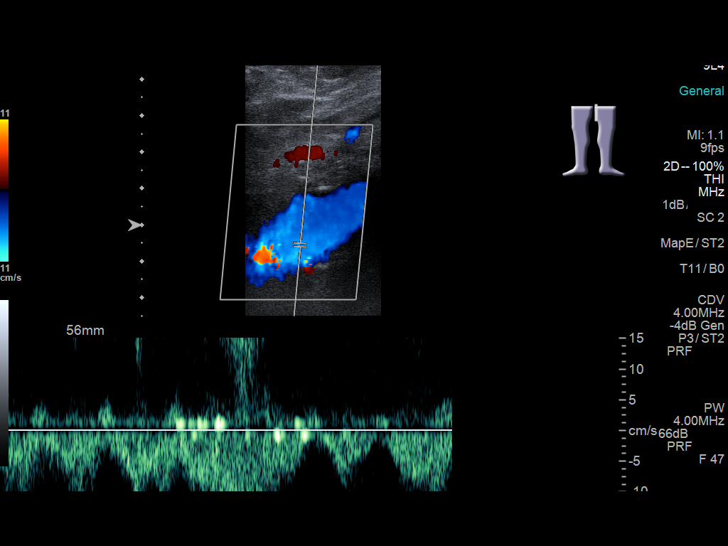
[im 11/31]
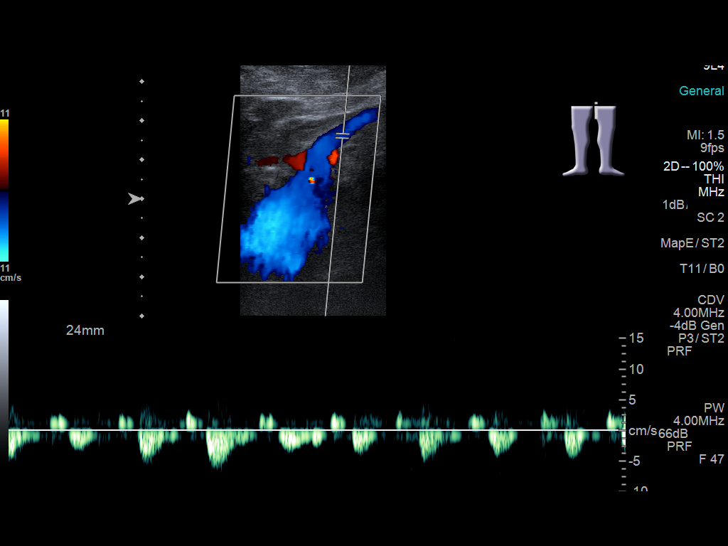
[im 14/31]
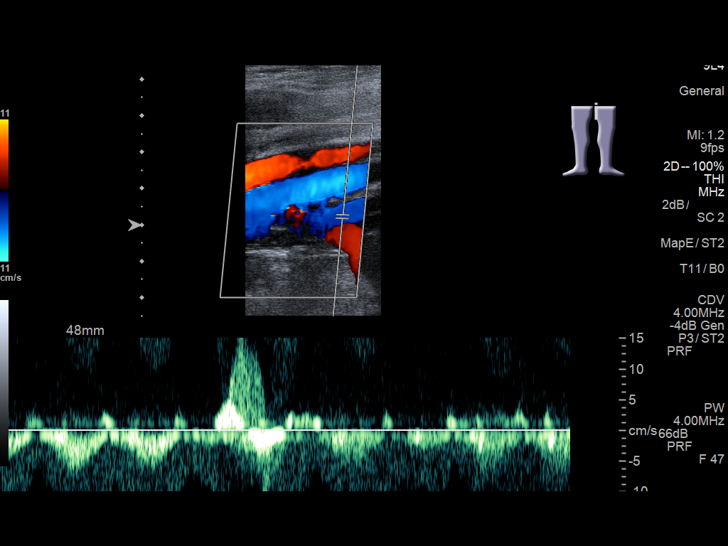
[im 16/31]
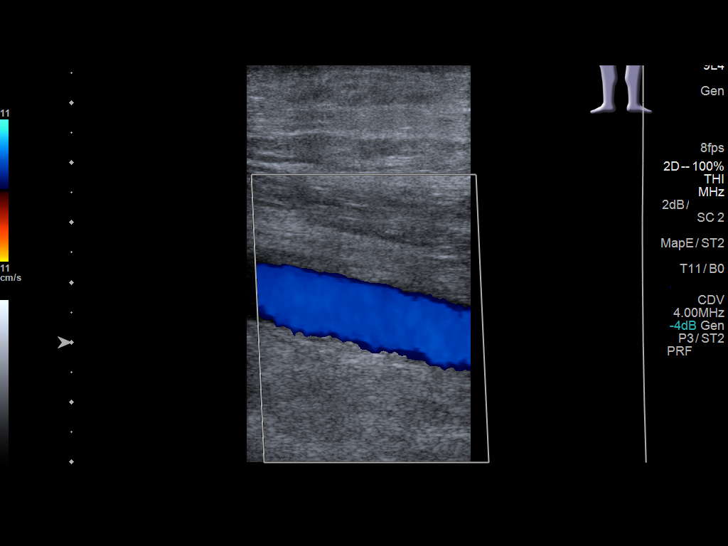
[im 17/31]
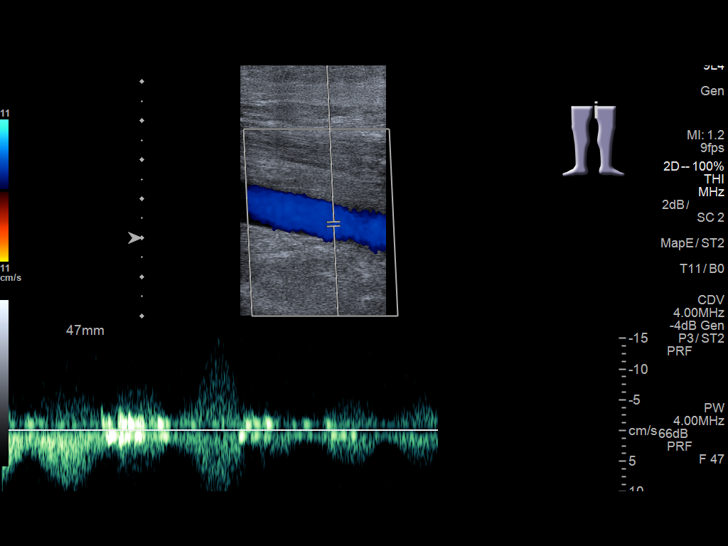
[im 20/31]
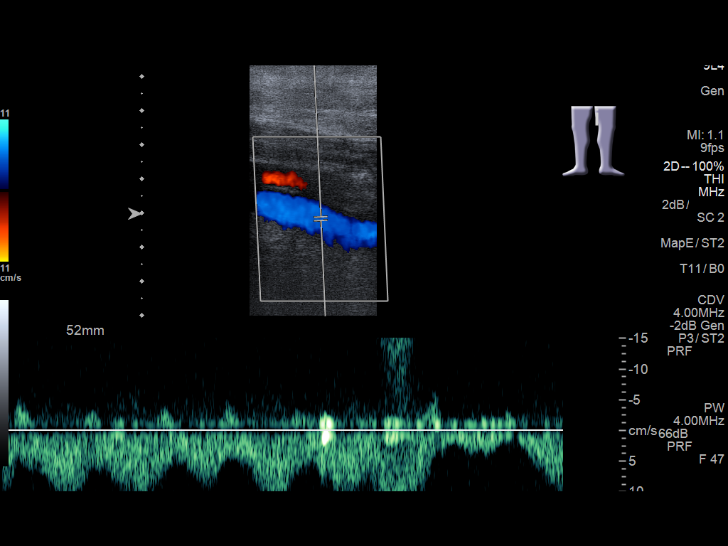
[im 23/31]
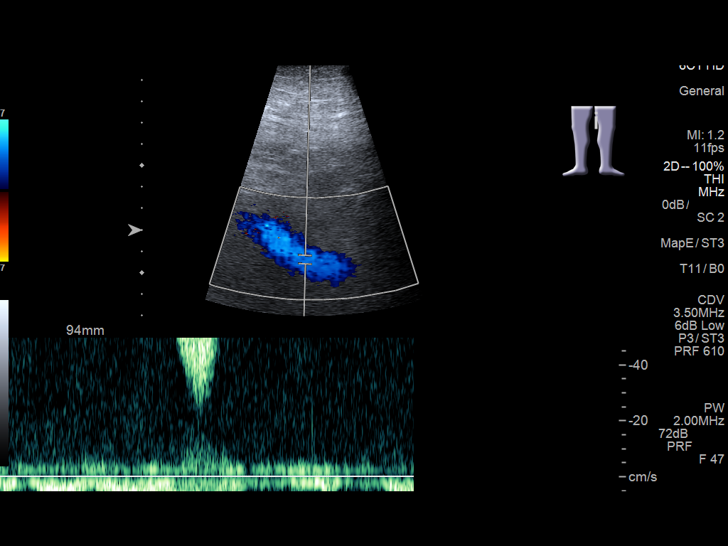
[im 25/31]
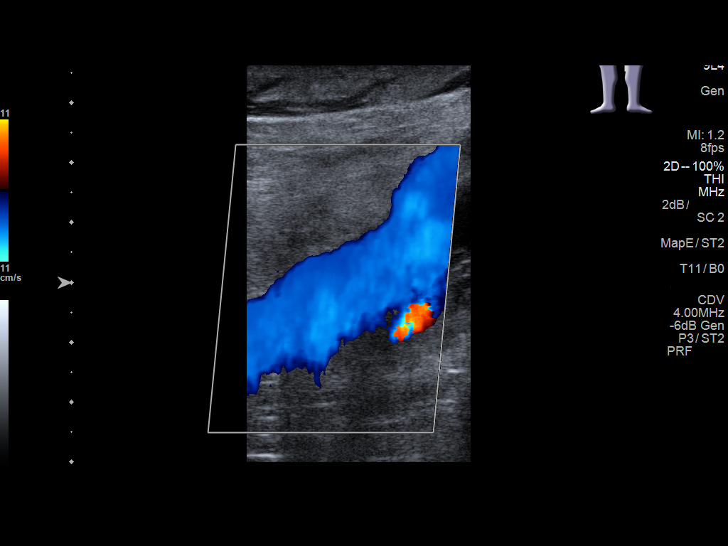
[im 28/31]
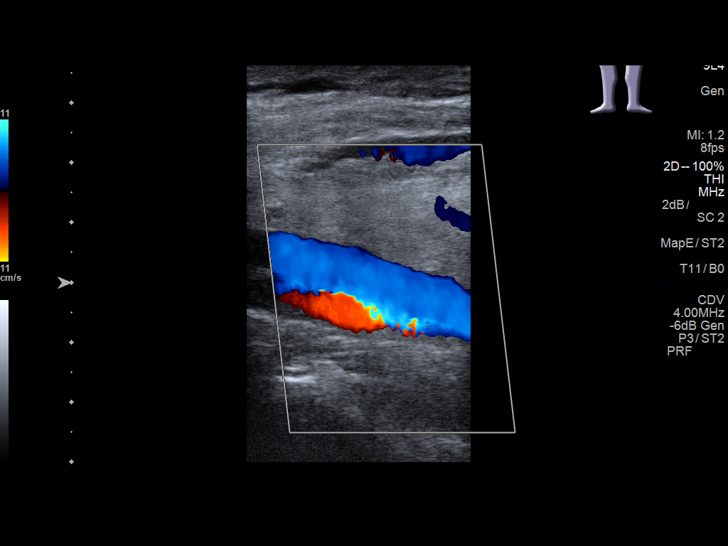
[im 31/31]
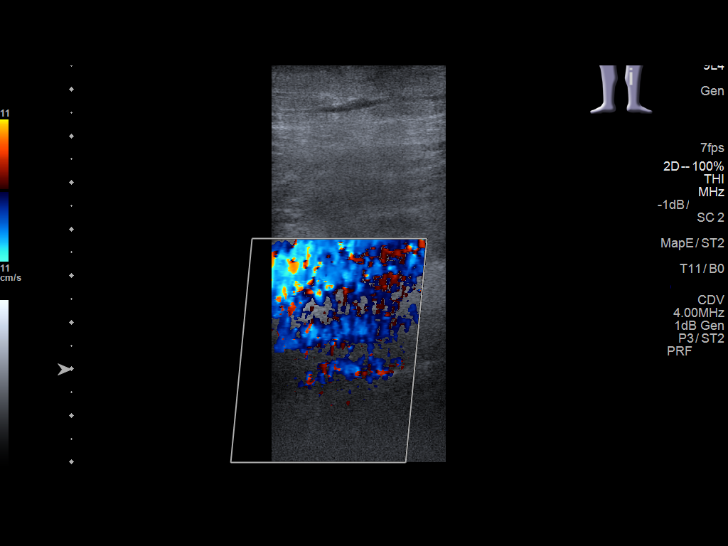

[13 of 24 positions shown; findings below may reference images not displayed]

FINDINGS: Contralateral Common Femoral Vein: Respiratory phasicity is normal
and symmetric with the symptomatic side. No evidence of thrombus.
Normal compressibility.

Common Femoral Vein: No evidence of thrombus. Normal
compressibility, respiratory phasicity and response to augmentation.

Saphenofemoral Junction: No evidence of thrombus. Normal
compressibility and flow on color Doppler imaging.

Profunda Femoral Vein: No evidence of thrombus. Normal
compressibility and flow on color Doppler imaging.

Femoral Vein: No evidence of thrombus. Normal compressibility,
respiratory phasicity and response to augmentation.

Popliteal Vein: No evidence of thrombus. Normal compressibility,
respiratory phasicity and response to augmentation.

Calf Veins: No evidence of thrombus. Normal compressibility and flow
on color Doppler imaging.

Superficial Great Saphenous Vein: No evidence of thrombus. Normal
compressibility.

Venous Reflux:  None.

Other Findings: Subcutaneous edema is noted at the level of the left
lower leg and calf.
IMPRESSION: No evidence of DVT within the left lower extremity.

## 2019-08-09 ENCOUNTER — Other Ambulatory Visit: Payer: Self-pay

## 2019-08-09 MED ORDER — ATORVASTATIN CALCIUM 80 MG PO TABS: 80.0000 mg | ORAL_TABLET | Freq: Every day | ORAL | 0 refills | Status: DC

## 2019-08-09 NOTE — Progress Notes (Signed)
[facility] Summary Report / Loop Recorder 

## 2019-08-12 ENCOUNTER — Ambulatory Visit (INDEPENDENT_AMBULATORY_CARE_PROVIDER_SITE_OTHER): Payer: Managed Care, Other (non HMO) | Admitting: *Deleted

## 2019-08-12 DIAGNOSIS — Z8673 Personal history of transient ischemic attack (TIA), and cerebral infarction without residual deficits: Secondary | ICD-10-CM | POA: Diagnosis not present

## 2019-08-12 LAB — CUP PACEART REMOTE DEVICE CHECK
Date Time Interrogation Session: 20201217151941
Implantable Pulse Generator Implant Date: 20190604

## 2019-09-09 ENCOUNTER — Ambulatory Visit: Payer: Self-pay | Admitting: Pharmacist

## 2019-09-09 ENCOUNTER — Telehealth: Payer: Managed Care, Other (non HMO)

## 2019-09-09 NOTE — Progress Notes (Signed)
Reviewed information.  Agree with assessment and plan.    Dr Keyonte Cookston 

## 2019-09-09 NOTE — Chronic Care Management (AMB) (Signed)
  Chronic Care Management   Note  09/09/2019 Name: Buster Schueller MRN: [id number] DOB: 09-Feb-1971  Tullio Chausse is a 49 y.o. year old male who is a primary care patient of [name], MD. The CCM team was consulted for assistance with chronic disease management and care coordination needs.    Attempted to contact patient to follow up on medication management needs, especially BG s/p GLP1 initiation. Left HIPAA compliant message for patient to return my call at his convenience.   Follow up plan: - Will collaborate w/ Care Guide to attempt outreach to patient to schedule f/u  Catie [name], PharmD, [place], [place] Pharmacist [place] [phone]

## 2019-09-10 ENCOUNTER — Telehealth: Payer: Self-pay | Admitting: Internal Medicine

## 2019-09-10 NOTE — Chronic Care Management (AMB) (Signed)
  Care Management   Note  09/10/2019 Name: Muaaz Brau MRN: [id number] DOB: 1970/09/22  Bell Cai is a 49 y.o. year old male who is a primary care patient of [name], MD and is actively engaged with the care management team. I reached out to [name] by phone today to assist with re-scheduling a follow up appointment with the Pharmacist  Follow up plan: Telephone appointment with care management team member scheduled for: 10/04/19  [place], [place] Management  [place], [place] [address] Direct Dial: [email or web address]  Website: [email or web address]

## 2019-09-11 NOTE — Progress Notes (Signed)
ILR remote 

## 2019-09-13 ENCOUNTER — Telehealth: Payer: Self-pay | Admitting: Internal Medicine

## 2019-09-13 NOTE — Telephone Encounter (Signed)
Pt needs a refill on levothyroxine (SYNTHROID) 125 MCG tablet and Insulin Syringe-Needle U-100 sent to  [facility]

## 2019-09-14 ENCOUNTER — Other Ambulatory Visit: Payer: Self-pay

## 2019-09-14 ENCOUNTER — Ambulatory Visit (INDEPENDENT_AMBULATORY_CARE_PROVIDER_SITE_OTHER): Payer: Managed Care, Other (non HMO) | Admitting: *Deleted

## 2019-09-14 DIAGNOSIS — Z8673 Personal history of transient ischemic attack (TIA), and cerebral infarction without residual deficits: Secondary | ICD-10-CM

## 2019-09-14 MED ORDER — LEVOTHYROXINE SODIUM 125 MCG PO TABS: 125.0000 ug | ORAL_TABLET | Freq: Every day | ORAL | 2 refills | Status: DC

## 2019-09-14 MED ORDER — "INSULIN SYRINGE 30G X 5/16"" 0.5 ML MISC": 5 refills | Status: DC

## 2019-09-14 NOTE — Telephone Encounter (Signed)
Rx refilled.

## 2019-09-15 LAB — CUP PACEART REMOTE DEVICE CHECK
Date Time Interrogation Session: 20210119152936
Implantable Pulse Generator Implant Date: 20190604

## 2019-09-17 ENCOUNTER — Ambulatory Visit: Payer: Managed Care, Other (non HMO) | Admitting: Internal Medicine

## 2019-09-22 ENCOUNTER — Other Ambulatory Visit: Payer: Self-pay

## 2019-09-22 ENCOUNTER — Ambulatory Visit: Payer: Managed Care, Other (non HMO)

## 2019-09-22 DIAGNOSIS — G4719 Other hypersomnia: Secondary | ICD-10-CM

## 2019-09-23 DIAGNOSIS — G4733 Obstructive sleep apnea (adult) (pediatric): Secondary | ICD-10-CM

## 2019-09-24 DIAGNOSIS — G4733 Obstructive sleep apnea (adult) (pediatric): Secondary | ICD-10-CM

## 2019-09-27 ENCOUNTER — Encounter: Payer: Self-pay | Admitting: Internal Medicine

## 2019-09-27 ENCOUNTER — Ambulatory Visit (INDEPENDENT_AMBULATORY_CARE_PROVIDER_SITE_OTHER): Payer: Managed Care, Other (non HMO) | Admitting: Internal Medicine

## 2019-09-27 ENCOUNTER — Other Ambulatory Visit: Payer: Self-pay

## 2019-09-27 DIAGNOSIS — I10 Essential (primary) hypertension: Secondary | ICD-10-CM

## 2019-09-27 DIAGNOSIS — Z794 Long term (current) use of insulin: Secondary | ICD-10-CM

## 2019-09-27 DIAGNOSIS — Z8673 Personal history of transient ischemic attack (TIA), and cerebral infarction without residual deficits: Secondary | ICD-10-CM

## 2019-09-27 DIAGNOSIS — E785 Hyperlipidemia, unspecified: Secondary | ICD-10-CM

## 2019-09-27 DIAGNOSIS — E11319 Type 2 diabetes mellitus with unspecified diabetic retinopathy without macular edema: Secondary | ICD-10-CM | POA: Diagnosis not present

## 2019-09-27 DIAGNOSIS — D649 Anemia, unspecified: Secondary | ICD-10-CM

## 2019-09-27 DIAGNOSIS — E039 Hypothyroidism, unspecified: Secondary | ICD-10-CM

## 2019-09-27 DIAGNOSIS — R400 Somnolence: Secondary | ICD-10-CM

## 2019-09-27 DIAGNOSIS — G629 Polyneuropathy, unspecified: Secondary | ICD-10-CM

## 2019-09-27 DIAGNOSIS — N1832 Chronic kidney disease, stage 3b: Secondary | ICD-10-CM

## 2019-09-27 MED ORDER — HYDRALAZINE HCL 50 MG PO TABS: 50.0000 mg | ORAL_TABLET | Freq: Three times a day (TID) | ORAL | 2 refills | Status: DC

## 2019-09-27 NOTE — Progress Notes (Signed)
Patient ID: Gregory Crane, male   DOB: 07-14-1971, 49 y.o.   MRN: [id number]   Virtual Visit via video Note  This visit type was conducted due to national recommendations for restrictions regarding the COVID-19 pandemic (e.g. social distancing).  This format is felt to be most appropriate for this patient at this time.  All issues noted in this document were discussed and addressed.  No physical exam was performed (except for noted visual exam findings with Video Visits).   I connected with Gregory Crane by a video enabled telemedicine application and verified that I am speaking with the correct person using two identifiers. Location patient: home Location provider: work  Persons participating in the virtual visit: patient, provider  The limitations, risks, security and privacy concerns of performing an evaluation and management service by video and the availability of in person appointments have ben discussed.  The patient expressed understanding and agreed to proceed.   Reason for visit: scheduled follow up.   HPI: States he is doing relatively well.  Saw pulmonary.  Discussed sleep study.  Not checking sugars regularly.  When checking, states averaging 130-140 with highest reading 160.  Discussed low carb diet and exercise.  Not exercising regularly.  Plans to get more serious about diet and exercise.  No chest pain.  No sob.  No acid reflux reported.  No abdominal pain.  Bowels moving.  Taking ozempic.  Blood pressure remaining elevated - averaging 140s/80.  Is better, but still elevated.  Handling stress.     ROS: See pertinent positives and negatives per HPI.  Past Medical History:  Diagnosis Date  . Allergy   . Bell's palsy   . Diabetes mellitus without complication (HCC)    diet controlled  . Hypertension   . Hypothyroidism   . Kidney stones   . Stroke [facility])     Past Surgical History:  Procedure Laterality Date  . LOOP RECORDER INSERTION N/A 01/27/2018   Procedure: LOOP  RECORDER INSERTION;  Surgeon: [name], MD;  Location: [place] [facility];  Service: Cardiovascular;  Laterality: N/A;  . LUMBAR PUNCTURE     as child  . NO PAST SURGERIES    . TEE WITHOUT CARDIOVERSION N/A 01/07/2018   Procedure: TRANSESOPHAGEAL ECHOCARDIOGRAM (TEE);  Surgeon: Minna Merritts, MD;  Location: [facility] ORS;  Service: Cardiovascular;  Laterality: N/A;    Family History  Problem Relation Age of Onset  . Breast cancer Mother   . Diabetes Father   . Diabetes Sister   . Arthritis Maternal Grandmother   . Diabetes Maternal Grandmother     SOCIAL HX: reviewed.    Current Outpatient Medications:  .  amLODipine (NORVASC) 5 MG tablet, Take 1 tablet (5 mg total) by mouth daily., Disp: 30 tablet, Rfl: 1 .  aspirin (ASPIRIN LOW DOSE) 81 MG EC tablet, TAKE 1 TABLET(81 MG) BY MOUTH DAILY, Disp: 90 tablet, Rfl: 1 .  atorvastatin (LIPITOR) 80 MG tablet, Take 1 tablet (80 mg total) by mouth daily., Disp: 90 tablet, Rfl: 0 .  clopidogrel (PLAVIX) 75 MG tablet, TAKE 1 TABLET(75 MG) BY MOUTH DAILY, Disp: 90 tablet, Rfl: 2 .  cyanocobalamin (,VITAMIN B-12,) 1000 MCG/ML injection, Inject 1 mL into the skin once a week for 3 weeks then once every 30 days., Disp: 10 mL, Rfl: 0 .  DULoxetine (CYMBALTA) 20 MG capsule, Take 20 mg by mouth daily. , Disp: , Rfl: 3 .  glucose blood (FREESTYLE LITE) test strip, Check blood sugars twice  a day (Dx. 250.02), Disp: 200 each, Rfl: 1 .  HUMALOG 100 UNIT/ML injection, INJECT 4 TO 6 UNITS UNDER THE SKIN THREE TIMES DAILY, Disp: 30 mL, Rfl: 0 .  Insulin Syringe-Needle U-100 (INSULIN SYRINGE .5CC/30GX5/16") 30G X 5/16" 0.5 ML MISC, USE AS DIRECTED WITH INSULIN, Disp: 300 each, Rfl: 5 .  LANTUS 100 UNIT/ML injection, INJECT 15 UNITS UNDER THE SKIN EVERY NIGHT AT BEDTIME, Disp: 30 mL, Rfl: 0 .  levothyroxine (SYNTHROID) 125 MCG tablet, Take 1 tablet (125 mcg total) by mouth daily before breakfast., Disp: 90 tablet, Rfl: 2 .  losartan (COZAAR) 100 MG  tablet, Take 1 tablet (100 mg total) by mouth daily., Disp: 30 tablet, Rfl: 3 .  ONETOUCH DELICA LANCETS FINE MISC, 1 applicator by Other route 4 (four) times daily., Disp: , Rfl: 0 .  pregabalin (LYRICA) 100 MG capsule, Take 1 capsule (100 mg total) by mouth 3 (three) times daily., Disp: 270 capsule, Rfl: 1 .  Semaglutide,0.25 or 0.5MG/DOS, (OZEMPIC, 0.25 OR 0.5 MG/DOSE,) 2 MG/1.5ML SOPN, Inject 0.5 mg into the skin once a week. Inject 0.25 mg once weekly for 4 weeks, then increase to 0.5 mg, Disp: 1 pen, Rfl: 2 .  Syringe/Needle, Disp, (SYRINGE 3CC/25GX1") 25G X 1" 3 ML MISC, Use as directed to administer b12 injections., Disp: 50 each, Rfl: 1 .  hydrALAZINE (APRESOLINE) 50 MG tablet, Take 1 tablet (50 mg total) by mouth 3 (three) times daily., Disp: 90 tablet, Rfl: 2  EXAM:  VITALS per patient if applicable: averaging 007M/22Q  GENERAL: alert, oriented, appears well and in no acute distress  HEENT: atraumatic, conjunttiva clear, no obvious abnormalities on inspection of external nose and ears  NECK: normal movements of the head and neck  LUNGS: on inspection no signs of respiratory distress, breathing rate appears normal, no obvious gross SOB, gasping or wheezing  CV: no obvious cyanosis  PSYCH/NEURO: pleasant and cooperative, no obvious depression or anxiety, speech and thought processing grossly intact  ASSESSMENT AND PLAN:  Discussed the following assessment and plan:  Anemia Follow cbc.   Chronic kidney disease (CKD), stage III (moderate) Off chlorthalidone.  Sugars as outlined.  Adjust blood pressure medication.  Avoid antiinflammatories.  Follow metabolic panel.   Diabetes Sugars have been poorly controlled.  Recent sugars as outlined.  Not checking regularly.  Discussed low carb diet and exercise.  Discussed importance of checking sugars regularly.  Follow met b and a1c.  Continue current medication regimen.    Essential hypertension Blood pressure remains elevated.   Increase hydralazine to 43m tid.  Follow pressures.  Follow metabolic panel.    History of CVA (cerebrovascular accident) Continue aspirin and plavix.    Hyperlipidemia On lipitor.  Low cholesterol diet and exercise.  Follow lipid panel and liver function tests.    Hypothyroidism On thyroid replacement.  Follow tsh.   [place] lyrica.    Daytime somnolence Saw pulmonary.  Sleep apnea.  CPAP.    Orders Placed This Encounter  Procedures  . CBC with Differential/Platelet    Standing Status:   Future    Standing Expiration Date:   10/02/2020  . Hemoglobin A1c    Standing Status:   Future    Standing Expiration Date:   10/02/2020  . Hepatic function panel    Standing Status:   Future    Standing Expiration Date:   10/02/2020  . Lipid panel    Standing Status:   Future    Standing Expiration Date:  10/02/2020  . TSH    Standing Status:   Future    Standing Expiration Date:   10/02/2020  . Basic metabolic panel    Standing Status:   Future    Standing Expiration Date:   10/02/2020  . IBC + Ferritin    Standing Status:   Future    Standing Expiration Date:   10/02/2020    Meds ordered this encounter  Medications  . hydrALAZINE (APRESOLINE) 50 MG tablet    Sig: Take 1 tablet (50 mg total) by mouth 3 (three) times daily.    Dispense:  90 tablet    Refill:  2     I discussed the assessment and treatment plan with the patient. The patient was provided an opportunity to ask questions and all were answered. The patient agreed with the plan and demonstrated an understanding of the instructions.   The patient was advised to call back or seek an in-person evaluation if the symptoms worsen or if the condition fails to improve as anticipated.   [name], MD

## 2019-10-03 NOTE — Assessment & Plan Note (Signed)
On thyroid replacement.  Follow tsh.  

## 2019-10-03 NOTE — Assessment & Plan Note (Signed)
On lyrica

## 2019-10-03 NOTE — Assessment & Plan Note (Signed)
Sugars have been poorly controlled.  Recent sugars as outlined.  Not checking regularly.  Discussed low carb diet and exercise.  Discussed importance of checking sugars regularly.  Follow met b and a1c.  Continue current medication regimen.

## 2019-10-03 NOTE — Assessment & Plan Note (Signed)
Off chlorthalidone.  Sugars as outlined.  Adjust blood pressure medication.  Avoid antiinflammatories.  Follow metabolic panel.

## 2019-10-03 NOTE — Assessment & Plan Note (Signed)
Follow cbc.  

## 2019-10-03 NOTE — Assessment & Plan Note (Signed)
Continue aspirin and plavix. 

## 2019-10-03 NOTE — Assessment & Plan Note (Signed)
On lipitor.  Low cholesterol diet and exercise.  Follow lipid panel and liver function tests.   

## 2019-10-03 NOTE — Assessment & Plan Note (Signed)
Blood pressure remains elevated.  Increase hydralazine to 50mg  tid.  Follow pressures.  Follow metabolic panel.

## 2019-10-03 NOTE — Assessment & Plan Note (Addendum)
Saw pulmonary.  Sleep apnea.  CPAP.

## 2019-10-04 ENCOUNTER — Telehealth: Payer: Managed Care, Other (non HMO)

## 2019-10-04 ENCOUNTER — Ambulatory Visit: Payer: Self-pay | Admitting: Pharmacist

## 2019-10-04 NOTE — Chronic Care Management (AMB) (Signed)
  Chronic Care Management   Note  10/04/2019 Name: Josimar Corning MRN: [id number] DOB: 1971-04-07  Lemuel Boodram is a 49 y.o. year old male who is a primary care patient of [name], MD. The CCM team was consulted for assistance with chronic disease management and care coordination needs.    Attempted to contact patient as scheduled for medication management review. Left HIPAA compliant message for him to return my call at his convenience.   He returned my call after hours, noted a death in the family occurred this morning. Will collaborate w/ Care Guides to outreach in the next few business days to reschedule follow up with me.   Catie [name], PharmD, [place], CPP Clinical Pharmacist [place] [phone]

## 2019-10-05 ENCOUNTER — Telehealth: Payer: Self-pay | Admitting: Internal Medicine

## 2019-10-05 NOTE — Chronic Care Management (AMB) (Signed)
  Care Management   Note  10/05/2019 Name: Axiel Fjeld MRN: [id number] DOB: 08/30/1970  Jamale Spangler is a 49 y.o. year old male who is a primary care patient of [name], MD and is actively engaged with the care management team. I reached out to [name] by phone today to assist with re-scheduling a follow up visit with the Pharmacist  Follow up plan: Telephone appointment with care management team member scheduled for:11/01/2019  Noreene Larsson, [place], [place], [place] [address] Direct Dial: [phone] [email or web address] Website: [email or web address]

## 2019-10-05 NOTE — Progress Notes (Signed)
Reviewed information.  Agree with assessment and plan.    Dr Nicki Reaper

## 2019-10-18 ENCOUNTER — Ambulatory Visit (INDEPENDENT_AMBULATORY_CARE_PROVIDER_SITE_OTHER): Payer: Managed Care, Other (non HMO) | Admitting: *Deleted

## 2019-10-18 ENCOUNTER — Telehealth: Payer: Self-pay | Admitting: Internal Medicine

## 2019-10-18 DIAGNOSIS — Z8673 Personal history of transient ischemic attack (TIA), and cerebral infarction without residual deficits: Secondary | ICD-10-CM | POA: Diagnosis not present

## 2019-10-18 DIAGNOSIS — G4733 Obstructive sleep apnea (adult) (pediatric): Secondary | ICD-10-CM

## 2019-10-18 LAB — CUP PACEART REMOTE DEVICE CHECK
Date Time Interrogation Session: 20210222003146
Implantable Pulse Generator Implant Date: 20190604

## 2019-10-18 NOTE — Telephone Encounter (Signed)
Called and let pt know we would let him know the results once DK reviewed HST.   DK please see below message and advise.

## 2019-10-19 NOTE — Progress Notes (Signed)
ILR Remote 

## 2019-10-23 ENCOUNTER — Encounter: Payer: Self-pay | Admitting: Internal Medicine

## 2019-10-25 ENCOUNTER — Other Ambulatory Visit: Payer: Self-pay

## 2019-10-25 MED ORDER — CLOPIDOGREL BISULFATE 75 MG PO TABS: ORAL_TABLET | ORAL | 2 refills | Status: DC

## 2019-10-25 MED ORDER — INSULIN GLARGINE 100 UNIT/ML ~~LOC~~ SOLN: SUBCUTANEOUS | 0 refills | Status: DC

## 2019-10-25 NOTE — Telephone Encounter (Signed)
DK please advise. Thanks 

## 2019-10-26 ENCOUNTER — Telehealth: Payer: Self-pay | Admitting: Internal Medicine

## 2019-10-26 NOTE — Telephone Encounter (Signed)
Pt is calling about sleep study results again please advise.

## 2019-10-26 NOTE — Telephone Encounter (Signed)
DX Moderate/Severe sleep apnea Needs to start autoCPAP 5-15 cm h20 Mask fitting referral if desired

## 2019-10-26 NOTE — Telephone Encounter (Signed)
Called and made pt aware of sleep study results. DME order placed. Nothing further needed.

## 2019-10-26 NOTE — Telephone Encounter (Signed)
Called and LMOM. Please see open encounter.

## 2019-11-01 ENCOUNTER — Telehealth: Payer: Managed Care, Other (non HMO)

## 2019-11-01 ENCOUNTER — Ambulatory Visit: Payer: Self-pay | Admitting: Pharmacist

## 2019-11-01 NOTE — Chronic Care Management (AMB) (Signed)
  Chronic Care Management   Note  11/01/2019 Name: Gregory Crane MRN: [id number] DOB: 1971/07/09  Gregory Crane is a 49 y.o. year old male who is a primary care patient of [name], MD. The CCM team was consulted for assistance with chronic disease management and care coordination needs.    Attempted to contact patient for medication management review. Left HIPAA compliant message for him to return my call at his convenience.   Follow up plan: - Will collaborate w/ Care Guide to outreach for scheduling  Catie [name], PharmD, [place], [place] Pharmacist [place] [phone]

## 2019-11-02 ENCOUNTER — Telehealth: Payer: Self-pay | Admitting: Internal Medicine

## 2019-11-02 NOTE — Progress Notes (Signed)
Reviewed.    Dr Nijah Orlich 

## 2019-11-02 NOTE — Chronic Care Management (AMB) (Signed)
  Care Management   Note  11/02/2019 Name: Daveon Arpino MRN: [id number] DOB: 06/27/1971  Cailean Heacock is a 49 y.o. year old male who is a primary care patient of [name], MD and is actively engaged with the care management team. I reached out to [name] by phone today to assist with scheduling a follow up visit with the Pharmacist  Follow up plan: Unsuccessful telephone outreach attempt made. A HIPPA compliant phone message was left for the patient providing contact information and requesting a return call.  The care management team will reach out to the patient again over the next 7 days.  If patient returns call to provider office, please advise to call [place] [place], [place], [place], [place] [address] Direct Dial: [phone] [email or web address] Website: [email or web address]

## 2019-11-05 NOTE — Chronic Care Management (AMB) (Signed)
  Care Management   Note  11/05/2019 Name: [name] MRN: [id number] DOB: May 10, 1971  Gregory Crane is a 49 y.o. year old male who is a primary care patient of [name], MD and is actively engaged with the care management team. I reached out to [name] by phone today to assist with scheduling a follow up visit with the Pharmacist  Follow up plan: Unsuccessful telephone outreach attempt made. A HIPPA compliant phone message was left for the patient providing contact information and requesting a return call.  The care management team will reach out to the patient again over the next 7 days.  If patient returns call to provider office, please advise to call [facility]  at [address], [place], [place], [place] [address] Direct Dial: [phone] [email or web address]: [email or web address]

## 2019-11-08 ENCOUNTER — Ambulatory Visit: Payer: Managed Care, Other (non HMO) | Admitting: Internal Medicine

## 2019-11-08 NOTE — Chronic Care Management (AMB) (Signed)
  Care Management   Note  11/08/2019 Name: Keyontae Huckeby MRN: [id number] DOB: [birth year]  Quintrell Baze is a 49 y.o. year old male who is a primary care patient of [name], MD and is actively engaged with the care management team. I reached out to [name] by phone today to assist with scheduling a follow up visit with the Pharmacist  Follow up plan: Telephone appointment with care management team member scheduled for: 12/09/2019  Noreene Larsson, [address], [place], [place] [address] Direct Dial: [phone] [email or web address] Website: [email or web address]

## 2019-11-16 ENCOUNTER — Encounter (INDEPENDENT_AMBULATORY_CARE_PROVIDER_SITE_OTHER): Payer: Self-pay | Admitting: Nurse Practitioner

## 2019-11-16 ENCOUNTER — Ambulatory Visit (INDEPENDENT_AMBULATORY_CARE_PROVIDER_SITE_OTHER): Payer: Managed Care, Other (non HMO) | Admitting: Vascular Surgery

## 2019-11-16 ENCOUNTER — Other Ambulatory Visit: Payer: Self-pay

## 2019-11-16 ENCOUNTER — Ambulatory Visit (INDEPENDENT_AMBULATORY_CARE_PROVIDER_SITE_OTHER): Payer: Managed Care, Other (non HMO) | Admitting: Nurse Practitioner

## 2019-11-16 ENCOUNTER — Telehealth: Payer: Self-pay | Admitting: Internal Medicine

## 2019-11-16 VITALS — BP 172/83 | HR 83 | Ht 72.0 in | Wt 320.0 lb

## 2019-11-16 DIAGNOSIS — I10 Essential (primary) hypertension: Secondary | ICD-10-CM | POA: Diagnosis not present

## 2019-11-16 DIAGNOSIS — I890 Lymphedema, not elsewhere classified: Secondary | ICD-10-CM

## 2019-11-16 NOTE — Telephone Encounter (Signed)
FYI for physician: Fax received from [facility] that patient canceled his CPAP Set up appointment due to patient being unable to afford CPAP at this time.  Rhonda J Cobb

## 2019-11-17 ENCOUNTER — Other Ambulatory Visit: Payer: Self-pay

## 2019-11-17 ENCOUNTER — Encounter: Payer: Self-pay | Admitting: Internal Medicine

## 2019-11-17 MED ORDER — ATORVASTATIN CALCIUM 80 MG PO TABS: 80.0000 mg | ORAL_TABLET | Freq: Every day | ORAL | 0 refills | Status: DC

## 2019-11-18 ENCOUNTER — Ambulatory Visit (INDEPENDENT_AMBULATORY_CARE_PROVIDER_SITE_OTHER): Payer: Managed Care, Other (non HMO) | Admitting: *Deleted

## 2019-11-18 DIAGNOSIS — Z8673 Personal history of transient ischemic attack (TIA), and cerebral infarction without residual deficits: Secondary | ICD-10-CM

## 2019-11-18 LAB — CUP PACEART REMOTE DEVICE CHECK
Date Time Interrogation Session: 20210325013345
Implantable Pulse Generator Implant Date: 20190604

## 2019-11-19 NOTE — Progress Notes (Signed)
ILR Remote 

## 2019-11-21 ENCOUNTER — Encounter (INDEPENDENT_AMBULATORY_CARE_PROVIDER_SITE_OTHER): Payer: Self-pay | Admitting: Nurse Practitioner

## 2019-11-21 NOTE — Progress Notes (Signed)
Subjective:    Patient ID: Gregory Crane, male    DOB: 11/23/1970, 49 y.o.   MRN: [id number] Chief Complaint  Patient presents with  . Follow-up    4-6 f/u lymph pump    Patient is a 49 year old male that presents today for evaluation of lymph pump follow-up.  However, unfortunately due to patient's insurance he was unable to obtain a lymphedema pump.  Since that time he has been wearing medical grade 1 compression stockings however with some difficulty.  The patient still continues to have significant lower extremity edema.  However he denies any ulceration.  He denies any fever, chills, nausea, vomiting or diarrhea.  The patient does elevate as much as possible.  However despite the patient conservative therapy he still continues to have significant lower extremity edema   Review of Systems  Cardiovascular: Positive for leg swelling.  Skin: Positive for rash (Mild stasis dermatitis).  All other systems reviewed and are negative.      Objective:   Physical Exam Vitals reviewed.  Constitutional:      Appearance: Normal appearance.  HENT:     Head: Normocephalic.  Cardiovascular:     Rate and Rhythm: Normal rate and regular rhythm.  Musculoskeletal:     Right lower leg: Edema (3+) present.     Left lower leg: Edema (3+) present.  Neurological:     Mental Status: He is alert.  Psychiatric:        Mood and Affect: Mood normal.        Behavior: Behavior normal.        Thought Content: Thought content normal.        Judgment: Judgment normal.     BP (!) 172/83   Pulse 83   Ht 6' (1.829 m)   Wt (!) 320 lb (145.2 kg)   BMI 43.40 kg/m   Past Medical History:  Diagnosis Date  . Allergy   . Bell's palsy   . Diabetes mellitus without complication (HCC)    diet controlled  . Hypertension   . Hypothyroidism   . Kidney stones   . Stroke [name])     Social History   Socioeconomic History  . Marital status: Married    Spouse name: [name]  . Number of children: 2    . Years of education: Not on file  . Highest education level: Not on file  Occupational History  . Not on file  Tobacco Use  . Smoking status: Never Smoker  . Smokeless tobacco: Never Used  Substance and Sexual Activity  . Alcohol use: No    Alcohol/week: 0.0 standard drinks    Comment: very rarely (1-2/year)  . Drug use: No  . Sexual activity: Yes  Other Topics Concern  . Not on file  Social History Narrative   Lives at home with wife, independent at baseline.   Social Determinants of Health   Financial Resource Strain:   . Difficulty of Paying Living Expenses:   Food Insecurity:   . Worried About Charity fundraiser in the Last Year:   . Arboriculturist in the Last Year:   Transportation Needs:   . Film/video editor (Medical):   Marland Kitchen Lack of Transportation (Non-Medical):   Physical Activity:   . Days of Exercise per Week:   . Minutes of Exercise per Session:   Stress:   . Feeling of Stress :   Social Connections:   . Frequency of Communication with Friends and Family:   .  Frequency of Social Gatherings with Friends and Family:   . Attends Religious Services:   . Active Member of Clubs or Organizations:   . Attends Archivist Meetings:   Marland Kitchen Marital Status:   Intimate Partner Violence:   . Fear of Current or Ex-Partner:   . Emotionally Abused:   Marland Kitchen Physically Abused:   . Sexually Abused:     Past Surgical History:  Procedure Laterality Date  . LOOP RECORDER INSERTION N/A 01/27/2018   Procedure: LOOP RECORDER INSERTION;  Surgeon: [name], MD;  Location: [facility];  Service: Cardiovascular;  Laterality: N/A;  . LUMBAR PUNCTURE     as child  . NO PAST SURGERIES    . TEE WITHOUT CARDIOVERSION N/A 01/07/2018   Procedure: TRANSESOPHAGEAL ECHOCARDIOGRAM (TEE);  Surgeon: Minna Merritts, MD;  Location: [facility];  Service: Cardiovascular;  Laterality: N/A;    Family History  Problem Relation Age of Onset  . Breast cancer Mother   .  Diabetes Father   . Diabetes Sister   . Arthritis Maternal Grandmother   . Diabetes Maternal Grandmother     No Known Allergies     Assessment & Plan:   1. Lymphedema We will place the patient in Bell wraps today for lower extremity edema.  This is to gain better control of the patient's lower extremity edema.  He will present to the office on a weekly basis for wrap change.  The patient will continue to do conservative therapy tactics such as elevating his lower extremities in addition to exercise.  We also discussed the possibility of going to the lymphedema clinic.  If this is unhelpful we will also discuss possible referral to lymphedema clinic as well.  Patient will follow up in 4 weeks for evaluation.  2. Essential hypertension Blood pressures somewhat elevated today.  Patient will continue with previous medications.  We will continue management PCP.   Current Outpatient Medications on File Prior to Visit  Medication Sig Dispense Refill  . amLODipine (NORVASC) 5 MG tablet Take 1 tablet (5 mg total) by mouth daily. 30 tablet 1  . aspirin (ASPIRIN LOW DOSE) 81 MG EC tablet TAKE 1 TABLET(81 MG) BY MOUTH DAILY 90 tablet 1  . clopidogrel (PLAVIX) 75 MG tablet TAKE 1 TABLET(75 MG) BY MOUTH DAILY 90 tablet 2  . cyanocobalamin (,VITAMIN B-12,) 1000 MCG/ML injection Inject 1 mL into the skin once a week for 3 weeks then once every 30 days. 10 mL 0  . DULoxetine (CYMBALTA) 20 MG capsule Take 20 mg by mouth daily.   3  . glucose blood (FREESTYLE LITE) test strip Check blood sugars twice a day (Dx. 250.02) 200 each 1  . HUMALOG 100 UNIT/ML injection INJECT 4 TO 6 UNITS UNDER THE SKIN THREE TIMES DAILY 30 mL 0  . hydrALAZINE (APRESOLINE) 50 MG tablet Take 1 tablet (50 mg total) by mouth 3 (three) times daily. 90 tablet 2  . insulin glargine (LANTUS) 100 UNIT/ML injection INJECT 15 UNITS UNDER THE SKIN EVERY NIGHT AT BEDTIME 30 mL 0  . Insulin Syringe-Needle U-100 (INSULIN SYRINGE  .5CC/30GX5/16") 30G X 5/16" 0.5 ML MISC USE AS DIRECTED WITH INSULIN 300 each 5  . levothyroxine (SYNTHROID) 125 MCG tablet Take 1 tablet (125 mcg total) by mouth daily before breakfast. 90 tablet 2  . losartan (COZAAR) 100 MG tablet Take 1 tablet (100 mg total) by mouth daily. 30 tablet 3  . ONETOUCH DELICA LANCETS FINE MISC 1 applicator by Other  route 4 (four) times daily.  0  . pregabalin (LYRICA) 100 MG capsule Take 1 capsule (100 mg total) by mouth 3 (three) times daily. 270 capsule 1  . Semaglutide,0.25 or 0.5MG /DOS, (OZEMPIC, 0.25 OR 0.5 MG/DOSE,) 2 MG/1.5ML SOPN Inject 0.5 mg into the skin once a week. Inject 0.25 mg once weekly for 4 weeks, then increase to 0.5 mg 1 pen 2  . Syringe/Needle, Disp, (SYRINGE 3CC/25GX1") 25G X 1" 3 ML MISC Use as directed to administer b12 injections. 50 each 1   No current facility-administered medications on file prior to visit.    There are no Patient Instructions on file for this visit. No follow-ups on file.   Kris Hartmann, NP

## 2019-11-24 ENCOUNTER — Encounter (INDEPENDENT_AMBULATORY_CARE_PROVIDER_SITE_OTHER): Payer: Managed Care, Other (non HMO)

## 2019-12-01 ENCOUNTER — Other Ambulatory Visit: Payer: Self-pay | Admitting: Internal Medicine

## 2019-12-01 ENCOUNTER — Encounter (INDEPENDENT_AMBULATORY_CARE_PROVIDER_SITE_OTHER): Payer: Managed Care, Other (non HMO)

## 2019-12-01 NOTE — Telephone Encounter (Signed)
Refill request for lyrica, last seen 11-01-19, last filled 06-07-19.  Please advise.

## 2019-12-02 NOTE — Telephone Encounter (Signed)
rx sent in for lyrica #270 with one refill.

## 2019-12-08 ENCOUNTER — Encounter (INDEPENDENT_AMBULATORY_CARE_PROVIDER_SITE_OTHER): Payer: Managed Care, Other (non HMO)

## 2019-12-09 ENCOUNTER — Ambulatory Visit: Payer: Managed Care, Other (non HMO) | Admitting: Pharmacist

## 2019-12-09 DIAGNOSIS — E11319 Type 2 diabetes mellitus with unspecified diabetic retinopathy without macular edema: Secondary | ICD-10-CM

## 2019-12-09 DIAGNOSIS — I10 Essential (primary) hypertension: Secondary | ICD-10-CM

## 2019-12-09 MED ORDER — OZEMPIC (1 MG/DOSE) 2 MG/1.5ML ~~LOC~~ SOPN: 1.0000 mg | PEN_INJECTOR | SUBCUTANEOUS | 2 refills | Status: DC

## 2019-12-09 NOTE — Chronic Care Management (AMB) (Signed)
Chronic Care Management   Follow Up Note   12/09/2019 Name: Gregory Crane MRN: [id number] DOB: 04/24/1971  Referred by: Gregory Pheasant, MD Reason for referral : Chronic Care Management (Medication Management)   Gregory Crane is a 49 y.o. year old male who is a primary care patient of Gregory Pheasant, MD. The CCM team was consulted for assistance with chronic disease management and care coordination needs.    Contacted patient for medication management review.   Review of patient status, including review of consultants reports, relevant laboratory and other test results, and collaboration with appropriate care team members and the patient's provider was performed as part of comprehensive patient evaluation and provision of chronic care management services.    SDOH (Social Determinants of Health) assessments performed: No See Care Plan activities for detailed interventions related to [facility])     Outpatient Encounter Medications as of 12/09/2019  Medication Sig  . aspirin (ASPIRIN LOW DOSE) 81 MG EC tablet TAKE 1 TABLET(81 MG) BY MOUTH DAILY  . atorvastatin (LIPITOR) 80 MG tablet Take 1 tablet (80 mg total) by mouth daily.  . clopidogrel (PLAVIX) 75 MG tablet TAKE 1 TABLET(75 MG) BY MOUTH DAILY  . cyanocobalamin (,VITAMIN B-12,) 1000 MCG/ML injection Inject 1 mL into the skin once a week for 3 weeks then once every 30 days.  . DULoxetine (CYMBALTA) 20 MG capsule Take 20 mg by mouth daily.   Marland Kitchen glucose blood (FREESTYLE LITE) test strip Check blood sugars twice a day (Dx. 250.02)  . HUMALOG 100 UNIT/ML injection INJECT 4 TO 6 UNITS UNDER THE SKIN THREE TIMES DAILY  . hydrALAZINE (APRESOLINE) 50 MG tablet Take 1 tablet (50 mg total) by mouth 3 (three) times daily.  . insulin glargine (LANTUS) 100 UNIT/ML injection INJECT 15 UNITS UNDER THE SKIN EVERY NIGHT AT BEDTIME  . Insulin Syringe-Needle U-100 (INSULIN SYRINGE .5CC/30GX5/16") 30G X 5/16" 0.5 ML MISC USE AS DIRECTED WITH INSULIN  .  levothyroxine (SYNTHROID) 125 MCG tablet Take 1 tablet (125 mcg total) by mouth daily before breakfast.  . losartan (COZAAR) 100 MG tablet Take 1 tablet (100 mg total) by mouth daily.  [name] DELICA LANCETS FINE MISC 1 applicator by Other route 4 (four) times daily.  . pregabalin (LYRICA) 100 MG capsule TAKE 1 CAPSULE(100 MG) BY MOUTH THREE TIMES DAILY  . Semaglutide,0.25 or 0.'5MG'$ /DOS, (OZEMPIC, 0.25 OR 0.5 MG/DOSE,) 2 MG/1.5ML SOPN Inject 0.5 mg into the skin once a week. Inject 0.25 mg once weekly for 4 weeks, then increase to 0.5 mg  . Syringe/Needle, Disp, (SYRINGE 3CC/25GX1") 25G X 1" 3 ML MISC Use as directed to administer b12 injections.  Marland Kitchen amLODipine (NORVASC) 5 MG tablet Take 1 tablet (5 mg total) by mouth daily. (Patient not taking: Reported on 12/09/2019)   No facility-administered encounter medications on file as of 12/09/2019.     Objective:   Goals Addressed            This Visit's Progress     Patient Stated   . PharmD "I want to work on my blood sugars" (pt-stated)       Current Barriers:  . Diabetes: uncontrolled; most recent T7D 2.2%; complicated by CKD, hx CVA o Medication options limited by renal function; eGFR generally hovering in 30-40s  o Denies any GI upset w/ Ozempic initiation. He is unsure it has had much of an impact on his sugar readings. . Current antihyperglycemic regimen: Lantus 15 units daily, Humalog 4-6 units TID with meals; Ozempic 0.5  mg once weekly  o D/c metformin d/t renal function . Current blood glucose readings:  o Fasting: 160s o 2 hours after lunch 130-140 range  . Cardiovascular risk reduction: o Current hypertensive regimen: losartan 100 mg QAM, hydralazine 50 mg TID (amlodipine HELD by Dr. Nicki Reaper in November d/t LEE) - Reports that he thinks something "weird" is going on with his cuff. Does report last "true" readings were in the 170s/80s; overdue for f/u with PCP o Current hyperlipidemia regimen: atorvastatin 80 mg daily; last LDL  75 o Current antiplatelet regimen: ASA 81 mg, clopidogrel 75 mg daily; last possible stroke was 03/2018, but this was determine to be related to Bell's palsy  Pharmacist Clinical Goal(s):  Marland Kitchen Over the next 90 days, patient will work with PharmD and primary care provider to address optimized glycemic management  Interventions: . Comprehensive medication review performed, medication list updated in electronic medical record . Inter-disciplinary care team collaboration (see longitudinal plan of care) . Reviewed elevated fastings. Reviewed patient's impression that Ozempic has not had much of an impact on sugar readings. Increase Ozempic to 1 mg weekly. If no benefit determined, would recommend d/c and adjust insulin therapy instead . Reviewed BP readings. Patient overdue for f/u with PCP, including lab work. Instructed patient to call the office and schedule f/u with Dr. Nicki Reaper. He notes his wife manages his medications, so mailing a medication list and reminder to schedule appt to them  Patient Self Care Activities:  . Patient will check blood glucose BID-TID , document, and provide at future appointments . Patient will take medications as prescribed . Patient will report any questions or concerns to provider   Please see past updates related to this goal by clicking on the "Past Updates" button in the selected goal          Plan:  - Scheduled f/u call 02/03/20  Catie [name], PharmD, BCACP, [place] Pharmacist [place] [phone]

## 2019-12-09 NOTE — Patient Instructions (Addendum)
Gregory Crane,   It was great talking to you today!  Schedule follow up with Dr. Nicki Reaper to talk about blood sugar, diabetes, and check lab work  Please verify your medications with your wife:   Blood Pressure: - Hydralazine 50 mg three times daily - Losartan 100 mg daily  (amlodipine is on your list, but it looks like it hasn't been filled in a while, and Dr. Nicki Reaper asked you to hold it back in November)  Cholesterol: - Atorvastatin 80 mg daily  Stroke Prevention: - Aspirin 81 mg daily - Clopidogrel 75 mg daily  Diabetes: - Ozempic 0.5 mg weekly - INCREASING to 1 mg weekly today - Lantus 15 units daily - Novolog ~4-6 units with meals   Nerve Pain: - Duloxetine 20 mg daily - Pregabalin 100 mg three times daily   Please call me with any questions or concerns.   Visit Information  Goals Addressed            This Visit's Progress     Patient Stated   . PharmD "I want to work on my blood sugars" (pt-stated)       Current Barriers:  . Diabetes: uncontrolled; most recent A4Z 6.6%; complicated by CKD, hx CVA o Medication options limited by renal function; eGFR generally hovering in 30-40s  o Denies any GI upset w/ Ozempic initiation. He is unsure it has had much of an impact on his sugar readings. . Current antihyperglycemic regimen: Lantus 15 units daily, Humalog 4-6 units TID with meals; Ozempic 0.5 mg once weekly  o D/c metformin d/t renal function . Current blood glucose readings:  o Fasting: 160s o 2 hours after lunch 130-140 range  . Cardiovascular risk reduction: o Current hypertensive regimen: losartan 100 mg QAM, hydralazine 50 mg TID (amlodipine HELD by Dr. Nicki Reaper in November d/t LEE) - Reports that he thinks something "weird" is going on with his cuff. Does report last "true" readings were in the 170s/80s; overdue for f/u with PCP o Current hyperlipidemia regimen: atorvastatin 80 mg daily; last LDL 75 o Current antiplatelet regimen: ASA 81 mg, clopidogrel 75 mg  daily; last possible stroke was 03/2018, but this was determine to be related to Bell's palsy  Pharmacist Clinical Goal(s):  Marland Kitchen Over the next 90 days, patient will work with PharmD and primary care provider to address optimized glycemic management  Interventions: . Comprehensive medication review performed, medication list updated in electronic medical record . Inter-disciplinary care team collaboration (see longitudinal plan of care) . Reviewed elevated fastings. Reviewed patient's impression that Ozempic has not had much of an impact on sugar readings. Increase Ozempic to 1 mg weekly. If no benefit determined, would recommend d/c and adjust insulin therapy instead . Reviewed BP readings. Patient overdue for f/u with PCP, including lab work. Instructed patient to call the office and schedule f/u with Dr. Nicki Reaper. He notes his wife manages his medications, so mailing a medication list and reminder to schedule appt to them  Patient Self Care Activities:  . Patient will check blood glucose BID-TID , document, and provide at future appointments . Patient will take medications as prescribed . Patient will report any questions or concerns to provider   Please see past updates related to this goal by clicking on the "Past Updates" button in the selected goal         Patient verbalizes understanding of instructions provided today.    Plan:  - Scheduled f/u call 02/03/20  Catie [name], PharmD, BCACP, CPP Clinical  Pharmacist [place] [phone]

## 2019-12-10 NOTE — Progress Notes (Signed)
Reviewed information.  Agree with plan.    Dr Sahith Nurse 

## 2019-12-15 ENCOUNTER — Ambulatory Visit (INDEPENDENT_AMBULATORY_CARE_PROVIDER_SITE_OTHER): Payer: Managed Care, Other (non HMO) | Admitting: Nurse Practitioner

## 2019-12-20 LAB — CUP PACEART REMOTE DEVICE CHECK
Date Time Interrogation Session: 20210425013246
Implantable Pulse Generator Implant Date: 20190604

## 2019-12-21 ENCOUNTER — Ambulatory Visit (INDEPENDENT_AMBULATORY_CARE_PROVIDER_SITE_OTHER): Payer: Managed Care, Other (non HMO) | Admitting: *Deleted

## 2019-12-21 DIAGNOSIS — Z8673 Personal history of transient ischemic attack (TIA), and cerebral infarction without residual deficits: Secondary | ICD-10-CM

## 2019-12-22 NOTE — Progress Notes (Signed)
ILR Remote 

## 2019-12-29 ENCOUNTER — Other Ambulatory Visit: Payer: Self-pay | Admitting: Internal Medicine

## 2019-12-31 ENCOUNTER — Encounter (INDEPENDENT_AMBULATORY_CARE_PROVIDER_SITE_OTHER): Payer: Self-pay | Admitting: Nurse Practitioner

## 2020-01-19 ENCOUNTER — Ambulatory Visit (INDEPENDENT_AMBULATORY_CARE_PROVIDER_SITE_OTHER): Payer: Managed Care, Other (non HMO) | Admitting: *Deleted

## 2020-01-19 DIAGNOSIS — G459 Transient cerebral ischemic attack, unspecified: Secondary | ICD-10-CM

## 2020-01-19 LAB — CUP PACEART REMOTE DEVICE CHECK
Date Time Interrogation Session: 20210526014717
Implantable Pulse Generator Implant Date: 20190604

## 2020-01-19 NOTE — Progress Notes (Signed)
[facility] Summary Report / Loop Recorder 

## 2020-01-20 ENCOUNTER — Other Ambulatory Visit: Payer: Self-pay | Admitting: Internal Medicine

## 2020-01-20 NOTE — Addendum Note (Signed)
Addended by: Douglass Rivers D on: 01/20/2020 04:25 PM   Modules accepted: Level of Service

## 2020-01-26 ENCOUNTER — Other Ambulatory Visit: Payer: Self-pay | Admitting: Internal Medicine

## 2020-01-26 DIAGNOSIS — Z8673 Personal history of transient ischemic attack (TIA), and cerebral infarction without residual deficits: Secondary | ICD-10-CM

## 2020-02-01 LAB — BASIC METABOLIC PANEL
BUN/Creatinine Ratio: 13 (ref 9–20)
BUN: 34 mg/dL — ABNORMAL HIGH (ref 6–24)
CO2: 25 mmol/L (ref 20–29)
Calcium: 8.9 mg/dL (ref 8.7–10.2)
Chloride: 110 mmol/L — ABNORMAL HIGH (ref 96–106)
Creatinine, Ser: 2.68 mg/dL — ABNORMAL HIGH (ref 0.76–1.27)
GFR calc Af Amer: 31 mL/min/{1.73_m2} — ABNORMAL LOW (ref >59)
GFR calc non Af Amer: 27 mL/min/{1.73_m2} — ABNORMAL LOW (ref >59)
Glucose: 160 mg/dL — ABNORMAL HIGH (ref 65–99)
Potassium: 5.4 mmol/L — ABNORMAL HIGH (ref 3.5–5.2)
Sodium: 146 mmol/L — ABNORMAL HIGH (ref 134–144)

## 2020-02-02 ENCOUNTER — Other Ambulatory Visit: Payer: Self-pay | Admitting: Internal Medicine

## 2020-02-02 DIAGNOSIS — E785 Hyperlipidemia, unspecified: Secondary | ICD-10-CM

## 2020-02-02 DIAGNOSIS — Z794 Long term (current) use of insulin: Secondary | ICD-10-CM

## 2020-02-02 DIAGNOSIS — E11319 Type 2 diabetes mellitus with unspecified diabetic retinopathy without macular edema: Secondary | ICD-10-CM

## 2020-02-02 DIAGNOSIS — I10 Essential (primary) hypertension: Secondary | ICD-10-CM

## 2020-02-02 DIAGNOSIS — D649 Anemia, unspecified: Secondary | ICD-10-CM

## 2020-02-02 DIAGNOSIS — E039 Hypothyroidism, unspecified: Secondary | ICD-10-CM

## 2020-02-02 NOTE — Progress Notes (Signed)
Order placed for f/u labs.  

## 2020-02-03 ENCOUNTER — Other Ambulatory Visit: Payer: Self-pay | Admitting: Internal Medicine

## 2020-02-03 ENCOUNTER — Ambulatory Visit: Payer: Self-pay | Admitting: Pharmacist

## 2020-02-03 ENCOUNTER — Telehealth: Payer: Managed Care, Other (non HMO)

## 2020-02-03 NOTE — Chronic Care Management (AMB) (Signed)
  Chronic Care Management   Note  02/03/2020 Name: Shivaay Stormont MRN: [id number] DOB: 04-Jan-1971  Ervey Fallin is a 49 y.o. year old male who is a primary care patient of [name], MD. The CCM team was consulted for assistance with chronic disease management and care coordination needs.    Attempted to contact patient for medication management review. Left HIPAA compliant message for patient to return my call at their convenience. Also noted to patient that he needed to return the call to the office to discuss his recent lab results and need for follow up labs.  Plan: - Will collaborate with Care Guide to outreach to schedule follow up with me  Catie [name], PharmD, [place], [place] Pharmacist [place] [phone]

## 2020-02-04 ENCOUNTER — Telehealth: Payer: Self-pay | Admitting: Internal Medicine

## 2020-02-04 ENCOUNTER — Telehealth: Payer: Self-pay

## 2020-02-04 DIAGNOSIS — E875 Hyperkalemia: Secondary | ICD-10-CM

## 2020-02-04 NOTE — Telephone Encounter (Signed)
Order placed for potassium lab 

## 2020-02-04 NOTE — Chronic Care Management (AMB) (Signed)
  Care Management   Note  02/04/2020 Name: [name] MRN: [id number] DOB: 1970-12-16  Gregory Crane is a 49 y.o. year old male who is a primary care patient of [name], MD and is actively engaged with the care management team. I reached out to [name] by phone today to assist with scheduling an initial visit with the Pharmacist.  Follow up plan: Unsuccessful telephone outreach attempt made. A HIPPA compliant phone message was left for the patient providing contact information and requesting a return call. The care management team will reach out to the patient again over the next 7 days. If patient returns call to provider office, please advise to call [place] at [phone].  [place], [place] [address] Direct Dial: [phone] [email or web address]

## 2020-02-04 NOTE — Progress Notes (Signed)
I have reviewed the above note and agree. I was available to the pharmacist for consultation.  Cora Stetson, MD 

## 2020-02-05 ENCOUNTER — Other Ambulatory Visit: Payer: Self-pay | Admitting: Internal Medicine

## 2020-02-05 DIAGNOSIS — N1832 Chronic kidney disease, stage 3b: Secondary | ICD-10-CM

## 2020-02-05 DIAGNOSIS — R944 Abnormal results of kidney function studies: Secondary | ICD-10-CM

## 2020-02-05 LAB — POTASSIUM: Potassium: 5 mmol/L (ref 3.5–5.2)

## 2020-02-05 NOTE — Progress Notes (Signed)
Order placed for nephrology referral.   °

## 2020-02-07 NOTE — Chronic Care Management (AMB) (Signed)
  Care Management   Note  02/07/2020 Name: Gregory Crane MRN: [id number] DOB: 11/07/1970  Gregory Crane is a 49 y.o. year old male who is a primary care patient of [name], MD and is actively engaged with the care management team. I reached out to [name] by phone today to assist with re-scheduling a follow up visit with the Pharmacist  Follow up plan: Telephone appointment with care management team member scheduled for:04/10/2020.  [place], [place] [address] Direct Dial: [phone] [email or web address]: [email or web address]

## 2020-02-13 ENCOUNTER — Other Ambulatory Visit: Payer: Self-pay | Admitting: Internal Medicine

## 2020-02-15 ENCOUNTER — Telehealth: Payer: Self-pay | Admitting: Internal Medicine

## 2020-02-15 NOTE — Telephone Encounter (Signed)
Please contact pt or Sol Englert (his wife) if she is emergency contact - needs appt with nephrology

## 2020-02-15 NOTE — Telephone Encounter (Signed)
Spoke with wife. Gave number for [facility]. They are going to call and schedule.

## 2020-02-15 NOTE — Telephone Encounter (Signed)
unable to reach patient to schedule appointment, he has not returned any of our calls. " [facility] said about 3 hours ago  "Rejection Reason - Patient did not respond - unable to reach patient to schedule appointment, he has not returned any of our calls. " [place] said about 3 hours ago

## 2020-02-16 ENCOUNTER — Telehealth: Payer: Self-pay | Admitting: Internal Medicine

## 2020-02-16 ENCOUNTER — Other Ambulatory Visit: Payer: Self-pay | Admitting: Internal Medicine

## 2020-02-16 NOTE — Telephone Encounter (Signed)
Pt called in and stated he made his appointt for Kidney specialist August 12th and he went to [facility] and they told him they did not have orders for his blood work

## 2020-02-17 NOTE — Addendum Note (Signed)
Addended by: Alisa Graff on: 02/17/2020 09:38 AM   Modules accepted: Orders

## 2020-02-17 NOTE — Addendum Note (Signed)
Addended by: Lars Masson on: 02/17/2020 09:16 AM   Modules accepted: Orders

## 2020-02-17 NOTE — Telephone Encounter (Signed)
Patient aware that labs have been reordered for [facility]

## 2020-02-17 NOTE — Progress Notes (Signed)
Labs reordered for [facility]  

## 2020-02-21 ENCOUNTER — Ambulatory Visit (INDEPENDENT_AMBULATORY_CARE_PROVIDER_SITE_OTHER): Payer: Managed Care, Other (non HMO) | Admitting: *Deleted

## 2020-02-21 DIAGNOSIS — G459 Transient cerebral ischemic attack, unspecified: Secondary | ICD-10-CM

## 2020-02-21 LAB — CUP PACEART REMOTE DEVICE CHECK
Date Time Interrogation Session: 20210628025353
Implantable Pulse Generator Implant Date: 20190604

## 2020-02-22 NOTE — Progress Notes (Signed)
[facility] Summary Report / Loop Recorder 

## 2020-02-24 LAB — CBC WITH DIFFERENTIAL/PLATELET
Basophils Absolute: 0 10*3/uL (ref 0.0–0.2)
Basos: 1 %
EOS (ABSOLUTE): 0.1 10*3/uL (ref 0.0–0.4)
Eos: 2 %
Hematocrit: 32.6 % — ABNORMAL LOW (ref 37.5–51.0)
Hemoglobin: 10.9 g/dL — ABNORMAL LOW (ref 13.0–17.7)
Immature Grans (Abs): 0 10*3/uL (ref 0.0–0.1)
Immature Granulocytes: 0 %
Lymphocytes Absolute: 1 10*3/uL (ref 0.7–3.1)
Lymphs: 18 %
MCH: 28.2 pg (ref 26.6–33.0)
MCHC: 33.4 g/dL (ref 31.5–35.7)
MCV: 85 fL (ref 79–97)
Monocytes Absolute: 0.4 10*3/uL (ref 0.1–0.9)
Monocytes: 8 %
Neutrophils Absolute: 3.9 10*3/uL (ref 1.4–7.0)
Neutrophils: 71 %
Platelets: 205 10*3/uL (ref 150–450)
RBC: 3.86 x10E6/uL — ABNORMAL LOW (ref 4.14–5.80)
RDW: 12.9 % (ref 11.6–15.4)
WBC: 5.4 10*3/uL (ref 3.4–10.8)

## 2020-02-24 LAB — IRON AND TIBC
Iron Saturation: 28 % (ref 15–55)
Iron: 57 ug/dL (ref 38–169)
Total Iron Binding Capacity: 204 ug/dL — ABNORMAL LOW (ref 250–450)
UIBC: 147 ug/dL (ref 111–343)

## 2020-02-24 LAB — HEPATIC FUNCTION PANEL
ALT: 13 IU/L (ref 0–44)
AST: 21 IU/L (ref 0–40)
Albumin: 3.3 g/dL — ABNORMAL LOW (ref 4.0–5.0)
Alkaline Phosphatase: 98 IU/L (ref 48–121)
Bilirubin Total: 0.2 mg/dL (ref 0.0–1.2)
Bilirubin, Direct: 0.1 mg/dL (ref 0.00–0.40)
Total Protein: 5.8 g/dL — ABNORMAL LOW (ref 6.0–8.5)

## 2020-02-24 LAB — VITAMIN B12: Vitamin B-12: 159 pg/mL — ABNORMAL LOW (ref 232–1245)

## 2020-02-24 LAB — HEMOGLOBIN A1C
Est. average glucose Bld gHb Est-mCnc: 131 mg/dL
Hgb A1c MFr Bld: 6.2 % — ABNORMAL HIGH (ref 4.8–5.6)

## 2020-02-24 LAB — FERRITIN: Ferritin: 307 ng/mL (ref 30–400)

## 2020-02-24 LAB — TSH: TSH: 10.4 u[IU]/mL — ABNORMAL HIGH (ref 0.450–4.500)

## 2020-03-03 ENCOUNTER — Other Ambulatory Visit: Payer: Self-pay | Admitting: Internal Medicine

## 2020-03-03 DIAGNOSIS — D649 Anemia, unspecified: Secondary | ICD-10-CM

## 2020-03-03 NOTE — Progress Notes (Signed)
Order placed for GI referral.   

## 2020-03-04 ENCOUNTER — Other Ambulatory Visit: Payer: Self-pay | Admitting: Internal Medicine

## 2020-03-04 DIAGNOSIS — E11319 Type 2 diabetes mellitus with unspecified diabetic retinopathy without macular edema: Secondary | ICD-10-CM

## 2020-03-06 ENCOUNTER — Telehealth: Payer: Self-pay | Admitting: Internal Medicine

## 2020-03-06 NOTE — Telephone Encounter (Signed)
Pt needs to talk to you about medication lists and states that something that he is taking is making him "woozie" but not sure what. Please advise

## 2020-03-07 NOTE — Telephone Encounter (Signed)
Called patient to clarify. Pt stated that it is not really a woozie feeling. He said he feels unsteady sometimes. This has been going on for awhile. He was wondering if maybe some of his medication could be causing this. He needs to have a BMP done as well. Confirmed no other acute symptoms. Does not feel faint, dizzy. No SOB. He is going to have his labs done at [facility]. Advised Dr Nicki Reaper is not in the office this week. Offered appt with another provider to discuss medication. Pt stated he would wait until Dr Nicki Reaper returns. Will be evaluated if any new/worsening acute issues occur before then.

## 2020-03-27 ENCOUNTER — Ambulatory Visit (INDEPENDENT_AMBULATORY_CARE_PROVIDER_SITE_OTHER): Payer: Managed Care, Other (non HMO) | Admitting: *Deleted

## 2020-03-27 DIAGNOSIS — G459 Transient cerebral ischemic attack, unspecified: Secondary | ICD-10-CM

## 2020-03-28 ENCOUNTER — Other Ambulatory Visit
Admission: RE | Admit: 2020-03-28 | Discharge: 2020-03-28 | Disposition: A | Discharge status: Home / Self Care | Payer: Managed Care, Other (non HMO) | Source: Ambulatory Visit | Attending: Gastroenterology | Admitting: Gastroenterology

## 2020-03-28 ENCOUNTER — Other Ambulatory Visit: Payer: Self-pay

## 2020-03-28 DIAGNOSIS — Z20822 Contact with and (suspected) exposure to covid-19: Secondary | ICD-10-CM | POA: Insufficient documentation

## 2020-03-28 DIAGNOSIS — Z01812 Encounter for preprocedural laboratory examination: Secondary | ICD-10-CM | POA: Diagnosis present

## 2020-03-28 LAB — CUP PACEART REMOTE DEVICE CHECK
Date Time Interrogation Session: 20210731030453
Implantable Pulse Generator Implant Date: 20190604

## 2020-03-28 LAB — SARS CORONAVIRUS 2 (TAT 6-24 HRS): SARS Coronavirus 2: NEGATIVE

## 2020-03-29 ENCOUNTER — Encounter: Payer: Self-pay | Admitting: *Deleted

## 2020-03-29 LAB — BASIC METABOLIC PANEL
BUN/Creatinine Ratio: 12 (ref 9–20)
BUN: 31 mg/dL — ABNORMAL HIGH (ref 6–24)
CO2: 24 mmol/L (ref 20–29)
Calcium: 8.5 mg/dL — ABNORMAL LOW (ref 8.7–10.2)
Chloride: 111 mmol/L — ABNORMAL HIGH (ref 96–106)
Creatinine, Ser: 2.49 mg/dL — ABNORMAL HIGH (ref 0.76–1.27)
GFR calc Af Amer: 34 mL/min/{1.73_m2} — ABNORMAL LOW (ref >59)
GFR calc non Af Amer: 29 mL/min/{1.73_m2} — ABNORMAL LOW (ref >59)
Glucose: 131 mg/dL — ABNORMAL HIGH (ref 65–99)
Potassium: 4.6 mmol/L (ref 3.5–5.2)
Sodium: 145 mmol/L — ABNORMAL HIGH (ref 134–144)

## 2020-03-29 LAB — LIPID PANEL
Chol/HDL Ratio: 3.5 ratio (ref 0.0–5.0)
Cholesterol, Total: 109 mg/dL (ref 100–199)
HDL: 31 mg/dL — ABNORMAL LOW (ref >39)
LDL Chol Calc (NIH): 60 mg/dL (ref 0–99)
Triglycerides: 89 mg/dL (ref 0–149)
VLDL Cholesterol Cal: 18 mg/dL (ref 5–40)

## 2020-03-29 NOTE — Progress Notes (Signed)
[facility] Summary Report / Loop Recorder 

## 2020-03-30 ENCOUNTER — Ambulatory Visit: Payer: Managed Care, Other (non HMO) | Admitting: Certified Registered Nurse Anesthetist

## 2020-03-30 ENCOUNTER — Telehealth: Payer: Self-pay

## 2020-03-30 ENCOUNTER — Other Ambulatory Visit: Payer: Self-pay

## 2020-03-30 ENCOUNTER — Ambulatory Visit
Admission: RE | Admit: 2020-03-30 | Discharge: 2020-03-30 | Disposition: A | Discharge status: Home / Self Care | Payer: Managed Care, Other (non HMO) | Attending: Gastroenterology | Admitting: Gastroenterology

## 2020-03-30 ENCOUNTER — Encounter: Payer: Self-pay | Admitting: *Deleted

## 2020-03-30 ENCOUNTER — Encounter
Admission: RE | Disposition: A | Discharge status: Home / Self Care | Payer: Self-pay | Source: Home / Self Care | Attending: Gastroenterology

## 2020-03-30 DIAGNOSIS — Z794 Long term (current) use of insulin: Secondary | ICD-10-CM | POA: Diagnosis not present

## 2020-03-30 DIAGNOSIS — I10 Essential (primary) hypertension: Secondary | ICD-10-CM | POA: Diagnosis not present

## 2020-03-30 DIAGNOSIS — Z8673 Personal history of transient ischemic attack (TIA), and cerebral infarction without residual deficits: Secondary | ICD-10-CM | POA: Diagnosis not present

## 2020-03-30 DIAGNOSIS — Z1211 Encounter for screening for malignant neoplasm of colon: Secondary | ICD-10-CM | POA: Insufficient documentation

## 2020-03-30 DIAGNOSIS — D123 Benign neoplasm of transverse colon: Secondary | ICD-10-CM | POA: Diagnosis not present

## 2020-03-30 DIAGNOSIS — Z7982 Long term (current) use of aspirin: Secondary | ICD-10-CM | POA: Insufficient documentation

## 2020-03-30 DIAGNOSIS — D124 Benign neoplasm of descending colon: Secondary | ICD-10-CM | POA: Insufficient documentation

## 2020-03-30 DIAGNOSIS — E119 Type 2 diabetes mellitus without complications: Secondary | ICD-10-CM | POA: Insufficient documentation

## 2020-03-30 DIAGNOSIS — D122 Benign neoplasm of ascending colon: Secondary | ICD-10-CM | POA: Diagnosis not present

## 2020-03-30 DIAGNOSIS — Z79890 Hormone replacement therapy (postmenopausal): Secondary | ICD-10-CM | POA: Insufficient documentation

## 2020-03-30 DIAGNOSIS — Z7902 Long term (current) use of antithrombotics/antiplatelets: Secondary | ICD-10-CM | POA: Insufficient documentation

## 2020-03-30 DIAGNOSIS — G510 Bell's palsy: Secondary | ICD-10-CM | POA: Diagnosis not present

## 2020-03-30 DIAGNOSIS — K640 First degree hemorrhoids: Secondary | ICD-10-CM | POA: Diagnosis not present

## 2020-03-30 DIAGNOSIS — E039 Hypothyroidism, unspecified: Secondary | ICD-10-CM | POA: Diagnosis not present

## 2020-03-30 DIAGNOSIS — Z79899 Other long term (current) drug therapy: Secondary | ICD-10-CM | POA: Diagnosis not present

## 2020-03-30 HISTORY — PX: COLONOSCOPY WITH PROPOFOL: SHX5780

## 2020-03-30 HISTORY — DX: Benign intracranial hypertension: G93.2

## 2020-03-30 HISTORY — DX: Anemia, unspecified: D64.9

## 2020-03-30 LAB — GLUCOSE, CAPILLARY: Glucose-Capillary: 149 mg/dL — ABNORMAL HIGH (ref 70–99)

## 2020-03-30 SURGERY — COLONOSCOPY WITH PROPOFOL
Anesthesia: General

## 2020-03-30 MED ORDER — PROPOFOL 10 MG/ML IV BOLUS
INTRAVENOUS | Status: AC
  Filled 2020-03-30: qty 20

## 2020-03-30 MED ORDER — PHENYLEPHRINE HCL (PRESSORS) 10 MG/ML IV SOLN
INTRAVENOUS | Status: DC | PRN
  Administered 2020-03-30: 100 ug via INTRAVENOUS

## 2020-03-30 MED ORDER — PROPOFOL 10 MG/ML IV BOLUS
INTRAVENOUS | Status: AC
  Filled 2020-03-30: qty 40

## 2020-03-30 MED ORDER — PROPOFOL 10 MG/ML IV BOLUS
INTRAVENOUS | Status: DC | PRN
  Administered 2020-03-30: 70 mg via INTRAVENOUS

## 2020-03-30 MED ORDER — EPHEDRINE SULFATE 50 MG/ML IJ SOLN
INTRAMUSCULAR | Status: DC | PRN
  Administered 2020-03-30 (×5): 10 mg via INTRAVENOUS

## 2020-03-30 MED ORDER — PROPOFOL 500 MG/50ML IV EMUL
INTRAVENOUS | Status: DC | PRN
  Administered 2020-03-30: 160 ug/kg/min via INTRAVENOUS

## 2020-03-30 MED ORDER — SODIUM CHLORIDE 0.9 % IV SOLN: INTRAVENOUS | Status: DC

## 2020-03-30 NOTE — Op Note (Signed)
[facility] Gastroenterology Patient Name: Gregory Crane Procedure Date: 03/30/2020 10:30 AM MRN: [id number] Account #: [id number] Date of Birth: 1971/07/27 Admit Type: Outpatient Age: 49 Room: [facility] ENDO ROOM 4 Gender: Male Note Status: Finalized Procedure:             Colonoscopy Indications:           Screening for colorectal malignant neoplasm Providers:             Andrey Farmer MD, MD Referring MD:          [name], MD (Referring MD) Medicines:             Monitored Anesthesia Care Complications:         No immediate complications. Estimated blood loss:                         Minimal. Procedure:             Pre-Anesthesia Assessment:                        - Prior to the procedure, a History and Physical was                         performed, and patient medications and allergies were                         reviewed. The patient is competent. The risks and                         benefits of the procedure and the sedation options and                         risks were discussed with the patient. All questions                         were answered and informed consent was obtained.                         Patient identification and proposed procedure were                         verified by the physician, the nurse, the anesthetist                         and the technician in the endoscopy suite. Mental                         Status Examination: alert and oriented. Airway                         Examination: normal oropharyngeal airway and neck                         mobility. Respiratory Examination: clear to                         auscultation. CV Examination: normal. Prophylactic  Antibiotics: The patient does not require prophylactic                         antibiotics. Prior Anticoagulants: The patient has                         taken Plavix (clopidogrel), last dose was 5 days prior                         to procedure.  ASA Grade Assessment: III - A patient                         with severe systemic disease. After reviewing the                         risks and benefits, the patient was deemed in                         satisfactory condition to undergo the procedure. The                         anesthesia plan was to use monitored anesthesia care                         (MAC). Immediately prior to administration of                         medications, the patient was re-assessed for adequacy                         to receive sedatives. The heart rate, respiratory                         rate, oxygen saturations, blood pressure, adequacy of                         pulmonary ventilation, and response to care were                         monitored throughout the procedure. The physical                         status of the patient was re-assessed after the                         procedure.                        After obtaining informed consent, the colonoscope was                         passed under direct vision. Throughout the procedure,                         the patient's blood pressure, pulse, and oxygen                         saturations were monitored continuously. The  Colonoscope was introduced through the anus and                         advanced to the the cecum, identified by appendiceal                         orifice and ileocecal valve. The colonoscopy was                         performed without difficulty. The patient tolerated                         the procedure well. The quality of the bowel                         preparation was adequate to identify polyps. Findings:      The perianal and digital rectal examinations were normal.      A 10 mm polyp was found in the ascending colon. The polyp was       multi-lobulated. The polyp was removed with a lift and cut technique       using a hot snare. Resection and retrieval were complete. Estimated       blood  loss was minimal. Edges of defect site were coagulated with tip of       snare.      A 6 mm polyp was found in the ascending colon. The polyp was       multi-lobulated. The polyp was removed with a lift and cut technique       using a hot snare. Resection and retrieval were complete. Estimated       blood loss was minimal. There was some concern for residual polyp that       was removed. It was felt like all residual polyp was removed.      A 2 mm polyp was found in the ascending colon. The polyp was sessile.       The polyp was removed with a jumbo cold forceps. Resection and retrieval       were complete. Estimated blood loss was minimal.      A 7 mm polyp was found in the hepatic flexure. The polyp was       multi-lobulated. Given it was multilobulated, the polyp was removed with       a lift and cut technique using a hot snare. Resection and retrieval were       complete. Estimated blood loss was minimal. To prevent bleeding       post-intervention, one hemostatic clip was successfully placed. There       was no bleeding at the end of the procedure.      A 4 mm polyp was found in the descending colon. The polyp was sessile.       The polyp was removed with a cold snare. Resection and retrieval were       complete. Estimated blood loss was minimal.      Internal hemorrhoids were found during retroflexion. The hemorrhoids       were Grade I (internal hemorrhoids that do not prolapse).      The exam was otherwise without abnormality on direct and retroflexion       views. Impression:            - One  10 mm polyp in the ascending colon, removed                         using lift and cut and a hot snare. Resected and                         retrieved.                        - One 6 mm polyp in the ascending colon, removed using                         lift and cut and a hot snare. Resected and retrieved.                        - One 2 mm polyp in the ascending colon, removed with                          a jumbo cold forceps. Resected and retrieved.                        - One 7 mm polyp at the hepatic flexure, removed using                         lift and cut and a hot snare. Resected and retrieved.                         Clip was placed.                        - One 4 mm polyp in the descending colon, removed with                         a cold snare. Resected and retrieved.                        - Internal hemorrhoids.                        - The examination was otherwise normal on direct and                         retroflexion views. Recommendation:        - Discharge patient to home.                        - Resume previous diet.                        - Resume Plavix (clopidogrel) at prior dose in 2 days.                         Refer to referring physician for further adjustment of                         therapy.                        - Await pathology  results.                        - Repeat colonoscopy in 6 months for surveillance                         after piecemeal polypectomy.                        - Return to referring physician as previously                         scheduled. Procedure Code(s):     --- Professional ---                        [id number], Colonoscopy, flexible; with removal of                         tumor(s), polyp(s), or other lesion(s) by snare                         technique                        45380, 33, Colonoscopy, flexible; with biopsy, single                         or multiple Diagnosis Code(s):     --- Professional ---                        Z12.11, Encounter for screening for malignant neoplasm                         of colon                        K63.5, Polyp of colon                        K64.0, First degree hemorrhoids CPT copyright 2019 [employer]. All rights reserved. The codes documented in this report are preliminary and upon coder review may  be revised to meet current compliance  requirements. Andrey Farmer, MD Andrey Farmer MD, MD 03/30/2020 11:56:19 AM Number of Addenda: 0 Note Initiated On: 03/30/2020 10:30 AM Scope Withdrawal Time: 0 hours 42 minutes 40 seconds  Total Procedure Duration: 0 hours 54 minutes 14 seconds  Estimated Blood Loss:  Estimated blood loss was minimal.      [facility]

## 2020-03-30 NOTE — Transfer of Care (Signed)
Immediate Anesthesia Transfer of Care Note  Patient: Gregory Crane  Procedure(s) Performed: COLONOSCOPY WITH PROPOFOL (N/A )  Patient Location: PACU  Anesthesia Type:General  Level of Consciousness: drowsy  Airway & Oxygen Therapy: Patient Spontanous Breathing  Post-op Assessment: Report given to RN and Post -op Vital signs reviewed and stable  Post vital signs: Reviewed and stable  Last Vitals:  Vitals Value Taken Time  BP    Temp 36.1 C 03/30/20 1145  Pulse 88 03/30/20 1146  Resp 19 03/30/20 1146  SpO2 100 % 03/30/20 1146  Vitals shown include unvalidated device data.  Last Pain:  Vitals:   03/30/20 1145  TempSrc: Temporal  PainSc:          Complications: No complications documented.

## 2020-03-30 NOTE — Anesthesia Preprocedure Evaluation (Signed)
Anesthesia Evaluation  Patient identified by MRN, date of birth, ID band Patient awake    Reviewed: Allergy & Precautions, H&P , NPO status , Patient's Chart, lab work & pertinent test results, reviewed documented beta blocker date and time   History of Anesthesia Complications Negative for: history of anesthetic complications  Airway Mallampati: II  TM Distance: >3 FB Neck ROM: full    Dental  (+) Dental Advidsory Given, Teeth Intact   Pulmonary neg shortness of breath, sleep apnea , neg COPD, neg recent URI,    Pulmonary exam normal breath sounds clear to auscultation       Cardiovascular Exercise Tolerance: Good hypertension, (-) angina(-) Past MI and (-) Cardiac Stents Normal cardiovascular exam(-) dysrhythmias (-) Valvular Problems/Murmurs Rhythm:regular Rate:Normal     Neuro/Psych neg Seizures TIA Neuromuscular disease (Bell's palsy) CVA, Residual Symptoms negative psych ROS   GI/Hepatic negative GI ROS, Neg liver ROS,   Endo/Other  diabetesHypothyroidism Morbid obesity  Renal/GU CRFRenal disease (kidney stones)  negative genitourinary   Musculoskeletal   Abdominal   Peds  Hematology negative hematology ROS (+)   Anesthesia Other Findings Past Medical History: No date: Allergy No date: Anemia No date: Bell's palsy No date: Diabetes mellitus without complication (HCC)     Comment:  diet controlled No date: Hypertension No date: Hypothyroidism No date: Kidney stones No date: Pseudotumor cerebri No date: Stroke ([employer])   Reproductive/Obstetrics negative OB ROS                             Anesthesia Physical Anesthesia Plan  ASA: III  Anesthesia Plan: General   Post-op Pain Management:    Induction: Intravenous  PONV Risk Score and Plan: 2 and Propofol infusion and TIVA  Airway Management Planned: Natural Airway and Nasal Cannula  Additional Equipment:   Intra-op Plan:    Post-operative Plan:   Informed Consent: I have reviewed the patients History and Physical, chart, labs and discussed the procedure including the risks, benefits and alternatives for the proposed anesthesia with the patient or authorized representative who has indicated his/her understanding and acceptance.     Dental Advisory Given  Plan Discussed with: Anesthesiologist, CRNA and Surgeon  Anesthesia Plan Comments:         Anesthesia Quick Evaluation

## 2020-03-30 NOTE — H&P (Signed)
Outpatient short stay form Pre-procedure 03/30/2020 10:37 AM Raylene Miyamoto MD, MPH  Primary Physician: Dr. Nicki Reaper  Reason for visit:  Anemia, Screening  History of present illness:   49 y/o gentleman with b12 deficiency and anemia here for colonoscopy. On plavix but last took 5 days ago. No abdominal surgeries, no family history of GI malignancies.    Current Facility-Administered Medications:  .  0.9 %  sodium chloride infusion, , Intravenous, Continuous, Harjot Dibello, Hilton Cork, MD  Medications Prior to Admission  Medication Sig Dispense Refill Last Dose  . aspirin (ASPIRIN LOW DOSE) 81 MG EC tablet TAKE 1 TABLET(81 MG) BY MOUTH DAILY 90 tablet 1 Past Week at Unknown time  . atorvastatin (LIPITOR) 80 MG tablet TAKE 1 TABLET(80 MG) BY MOUTH DAILY 90 tablet 0 03/29/2020 at Unknown time  . clopidogrel (PLAVIX) 75 MG tablet TAKE 1 TABLET(75 MG) BY MOUTH DAILY 90 tablet 2 Past Week at Unknown time  . cyanocobalamin (,VITAMIN B-12,) 1000 MCG/ML injection Inject 1 mL into the skin once a week for 3 weeks then once every 30 days. 10 mL 0 Past Week at Unknown time  . DULoxetine (CYMBALTA) 20 MG capsule Take 20 mg by mouth daily.   3 Past Week at Unknown time  . HUMALOG 100 UNIT/ML injection INJECT 4 TO 6 UNITS UNDER THE SKIN THREE TIMES DAILY 30 mL 0 03/29/2020 at Unknown time  . hydrALAZINE (APRESOLINE) 50 MG tablet TAKE 1 TABLET(50 MG) BY MOUTH THREE TIMES DAILY 90 tablet 2 03/29/2020 at Unknown time  . insulin glargine (LANTUS) 100 UNIT/ML injection ADMINISTER 15 UNITS UNDER THE SKIN EVERY NIGHT AT BEDTIME 30 mL 0 03/29/2020 at Unknown time  . levothyroxine (SYNTHROID) 125 MCG tablet Take 1 tablet (125 mcg total) by mouth daily before breakfast. 90 tablet 2 03/29/2020 at Unknown time  . losartan (COZAAR) 100 MG tablet TAKE 1 TABLET BY MOUTH DAILY 90 tablet 1 03/29/2020 at Unknown time  . OZEMPIC, 1 MG/DOSE, 4 MG/3ML SOPN INJECT 1MG  INTO THE SKIN ONCE A WEEK 3 mL 1 Past Week at Unknown time  . pregabalin  (LYRICA) 100 MG capsule TAKE 1 CAPSULE(100 MG) BY MOUTH THREE TIMES DAILY 270 capsule 1 03/29/2020 at Unknown time  . amLODipine (NORVASC) 5 MG tablet Take 1 tablet (5 mg total) by mouth daily. (Patient not taking: Reported on 12/09/2019) 30 tablet 1   . glucose blood (FREESTYLE LITE) test strip Check blood sugars twice a day (Dx. 250.02) 200 each 1   . Insulin Syringe-Needle U-100 (INSULIN SYRINGE .5CC/30GX5/16") 30G X 5/16" 0.5 ML MISC USE AS DIRECTED WITH INSULIN 300 each 5   . ONETOUCH DELICA LANCETS FINE MISC 1 applicator by Other route 4 (four) times daily.  0   . Syringe/Needle, Disp, (SYRINGE 3CC/25GX1") 25G X 1" 3 ML MISC Use as directed to administer b12 injections. 50 each 1      No Known Allergies   Past Medical History:  Diagnosis Date  . Allergy   . Anemia   . Bell's palsy   . Diabetes mellitus without complication (HCC)    diet controlled  . Hypertension   . Hypothyroidism   . Kidney stones   . Pseudotumor cerebri   . Stroke [facility])     Review of systems:  Otherwise negative.    Physical Exam  Gen: Alert, oriented. Appears stated age.  HEENT: [place] II/AT. PERRLA. Lungs: no respiratory distress Abd: soft, benign, no masses. BS+ Ext: No edema.    Planned procedures: Proceed with colonoscopy.  The patient understands the nature of the planned procedure, indications, risks, alternatives and potential complications including but not limited to bleeding, infection, perforation, damage to internal organs and possible oversedation/side effects from anesthesia. The patient agrees and gives consent to proceed.  Please refer to procedure notes for findings, recommendations and patient disposition/instructions.     Raylene Miyamoto MD, MPH Gastroenterology 03/30/2020  10:37 AM

## 2020-03-30 NOTE — Telephone Encounter (Signed)
LMTCB in regards to lab results.  

## 2020-03-30 NOTE — Anesthesia Procedure Notes (Signed)
Performed by: Demetrius Charity, CRNA Pre-anesthesia Checklist: Emergency Drugs available, Patient identified, Suction available and Patient being monitored Patient Re-evaluated:Patient Re-evaluated prior to induction Oxygen Delivery Method: Supernova nasal CPAP Induction Type: IV induction

## 2020-03-30 NOTE — Anesthesia Postprocedure Evaluation (Signed)
Anesthesia Post Note  Patient: Gregory Crane  Procedure(s) Performed: COLONOSCOPY WITH PROPOFOL (N/A )  Patient location during evaluation: Endoscopy Anesthesia Type: General Level of consciousness: awake and alert Pain management: pain level controlled Vital Signs Assessment: post-procedure vital signs reviewed and stable Respiratory status: spontaneous breathing, nonlabored ventilation, respiratory function stable and patient connected to nasal cannula oxygen Cardiovascular status: blood pressure returned to baseline and stable Postop Assessment: no apparent nausea or vomiting Anesthetic complications: no   No complications documented.   Last Vitals:  Vitals:   03/30/20 1210 03/30/20 1220  BP: (!) 166/67 (!) 180/69  Pulse:    Resp:    Temp:    SpO2:      Last Pain:  Vitals:   03/30/20 1210  TempSrc:   PainSc: 0-No pain                 Martha Clan

## 2020-03-31 ENCOUNTER — Encounter: Payer: Self-pay | Admitting: Gastroenterology

## 2020-03-31 LAB — SURGICAL PATHOLOGY

## 2020-03-31 NOTE — Telephone Encounter (Signed)
Pt returned call regarding results 

## 2020-03-31 NOTE — Telephone Encounter (Signed)
See result note.  

## 2020-04-03 ENCOUNTER — Emergency Department: Payer: Managed Care, Other (non HMO)

## 2020-04-03 ENCOUNTER — Encounter: Payer: Self-pay | Admitting: Emergency Medicine

## 2020-04-03 ENCOUNTER — Inpatient Hospital Stay
Admission: EM | Admit: 2020-04-03 | Discharge: 2020-04-07 | DRG: 177 | Disposition: A | Discharge status: Home / Self Care | Payer: Managed Care, Other (non HMO) | Attending: Internal Medicine | Admitting: Internal Medicine

## 2020-04-03 ENCOUNTER — Other Ambulatory Visit: Payer: Self-pay

## 2020-04-03 DIAGNOSIS — Z7902 Long term (current) use of antithrombotics/antiplatelets: Secondary | ICD-10-CM | POA: Diagnosis not present

## 2020-04-03 DIAGNOSIS — E1165 Type 2 diabetes mellitus with hyperglycemia: Secondary | ICD-10-CM | POA: Diagnosis not present

## 2020-04-03 DIAGNOSIS — J9601 Acute respiratory failure with hypoxia: Secondary | ICD-10-CM | POA: Diagnosis present

## 2020-04-03 DIAGNOSIS — J302 Other seasonal allergic rhinitis: Secondary | ICD-10-CM | POA: Diagnosis present

## 2020-04-03 DIAGNOSIS — U071 COVID-19: Secondary | ICD-10-CM | POA: Diagnosis present

## 2020-04-03 DIAGNOSIS — J1282 Pneumonia due to coronavirus disease 2019: Secondary | ICD-10-CM

## 2020-04-03 DIAGNOSIS — R778 Other specified abnormalities of plasma proteins: Secondary | ICD-10-CM | POA: Diagnosis present

## 2020-04-03 DIAGNOSIS — Z8673 Personal history of transient ischemic attack (TIA), and cerebral infarction without residual deficits: Secondary | ICD-10-CM | POA: Diagnosis not present

## 2020-04-03 DIAGNOSIS — N184 Chronic kidney disease, stage 4 (severe): Secondary | ICD-10-CM | POA: Diagnosis present

## 2020-04-03 DIAGNOSIS — Z833 Family history of diabetes mellitus: Secondary | ICD-10-CM | POA: Diagnosis not present

## 2020-04-03 DIAGNOSIS — N179 Acute kidney failure, unspecified: Secondary | ICD-10-CM | POA: Diagnosis present

## 2020-04-03 DIAGNOSIS — N186 End stage renal disease: Secondary | ICD-10-CM

## 2020-04-03 DIAGNOSIS — E66813 Obesity, class 3: Secondary | ICD-10-CM

## 2020-04-03 DIAGNOSIS — N401 Enlarged prostate with lower urinary tract symptoms: Secondary | ICD-10-CM | POA: Diagnosis present

## 2020-04-03 DIAGNOSIS — I129 Hypertensive chronic kidney disease with stage 1 through stage 4 chronic kidney disease, or unspecified chronic kidney disease: Secondary | ICD-10-CM | POA: Diagnosis present

## 2020-04-03 DIAGNOSIS — R439 Unspecified disturbances of smell and taste: Secondary | ICD-10-CM | POA: Diagnosis present

## 2020-04-03 DIAGNOSIS — Z283 Underimmunization status: Secondary | ICD-10-CM | POA: Diagnosis not present

## 2020-04-03 DIAGNOSIS — D631 Anemia in chronic kidney disease: Secondary | ICD-10-CM | POA: Diagnosis present

## 2020-04-03 DIAGNOSIS — E039 Hypothyroidism, unspecified: Secondary | ICD-10-CM | POA: Diagnosis present

## 2020-04-03 DIAGNOSIS — Z7982 Long term (current) use of aspirin: Secondary | ICD-10-CM

## 2020-04-03 DIAGNOSIS — R7989 Other specified abnormal findings of blood chemistry: Secondary | ICD-10-CM

## 2020-04-03 DIAGNOSIS — E1122 Type 2 diabetes mellitus with diabetic chronic kidney disease: Secondary | ICD-10-CM | POA: Diagnosis present

## 2020-04-03 DIAGNOSIS — Z794 Long term (current) use of insulin: Secondary | ICD-10-CM | POA: Diagnosis not present

## 2020-04-03 DIAGNOSIS — Z79890 Hormone replacement therapy (postmenopausal): Secondary | ICD-10-CM | POA: Diagnosis not present

## 2020-04-03 DIAGNOSIS — T380X5A Adverse effect of glucocorticoids and synthetic analogues, initial encounter: Secondary | ICD-10-CM | POA: Diagnosis not present

## 2020-04-03 DIAGNOSIS — Z6841 Body Mass Index (BMI) 40.0 and over, adult: Secondary | ICD-10-CM

## 2020-04-03 DIAGNOSIS — Z79899 Other long term (current) drug therapy: Secondary | ICD-10-CM | POA: Diagnosis not present

## 2020-04-03 DIAGNOSIS — E119 Type 2 diabetes mellitus without complications: Secondary | ICD-10-CM

## 2020-04-03 DIAGNOSIS — Z713 Dietary counseling and surveillance: Secondary | ICD-10-CM

## 2020-04-03 LAB — CBC
HCT: 26.8 % — ABNORMAL LOW (ref 39.0–52.0)
Hemoglobin: 9.2 g/dL — ABNORMAL LOW (ref 13.0–17.0)
MCH: 29.4 pg (ref 26.0–34.0)
MCHC: 34.3 g/dL (ref 30.0–36.0)
MCV: 85.6 fL (ref 80.0–100.0)
Platelets: 142 10*3/uL — ABNORMAL LOW (ref 150–400)
RBC: 3.13 MIL/uL — ABNORMAL LOW (ref 4.22–5.81)
RDW: 12.9 % (ref 11.5–15.5)
WBC: 5.2 10*3/uL (ref 4.0–10.5)
nRBC: 0 % (ref 0.0–0.2)

## 2020-04-03 LAB — BASIC METABOLIC PANEL
Anion gap: 9 (ref 5–15)
BUN: 39 mg/dL — ABNORMAL HIGH (ref 6–20)
CO2: 23 mmol/L (ref 22–32)
Calcium: 7.7 mg/dL — ABNORMAL LOW (ref 8.9–10.3)
Chloride: 107 mmol/L (ref 98–111)
Creatinine, Ser: 4.08 mg/dL — ABNORMAL HIGH (ref 0.61–1.24)
GFR calc Af Amer: 19 mL/min — ABNORMAL LOW (ref >60)
GFR calc non Af Amer: 16 mL/min — ABNORMAL LOW (ref >60)
Glucose, Bld: 118 mg/dL — ABNORMAL HIGH (ref 70–99)
Potassium: 4.3 mmol/L (ref 3.5–5.1)
Sodium: 139 mmol/L (ref 135–145)

## 2020-04-03 LAB — TROPONIN I (HIGH SENSITIVITY): Troponin I (High Sensitivity): 150 ng/L (ref <18)

## 2020-04-03 MED ORDER — SODIUM CHLORIDE 0.9 % IV SOLN
100.0000 mg | Freq: Every day | INTRAVENOUS | Status: AC
  Administered 2020-04-04 – 2020-04-07 (×4): 100 mg via INTRAVENOUS
  Filled 2020-04-03 (×5): qty 20

## 2020-04-03 MED ORDER — SODIUM CHLORIDE 0.9% FLUSH: 3.0000 mL | Freq: Once | INTRAVENOUS | Status: DC

## 2020-04-03 MED ORDER — DEXAMETHASONE SODIUM PHOSPHATE 10 MG/ML IJ SOLN
10.0000 mg | Freq: Once | INTRAMUSCULAR | Status: AC
  Administered 2020-04-03: 10 mg via INTRAVENOUS
  Filled 2020-04-03: qty 1

## 2020-04-03 MED ORDER — SODIUM CHLORIDE 0.9 % IV BOLUS
1000.0000 mL | Freq: Once | INTRAVENOUS | Status: AC
  Administered 2020-04-03: 1000 mL via INTRAVENOUS

## 2020-04-03 MED ORDER — SODIUM CHLORIDE 0.9 % IV SOLN
200.0000 mg | Freq: Once | INTRAVENOUS | Status: AC
  Administered 2020-04-04: 200 mg via INTRAVENOUS
  Filled 2020-04-03: qty 200

## 2020-04-03 NOTE — ED Provider Notes (Signed)
[facility] Emergency Department Provider Note  ____________________________________________   First MD Initiated Contact with Patient 04/03/20 2312     (approximate)  I have reviewed the triage vital signs and the nursing notes.   HISTORY  Chief Complaint Weakness and Dizziness    HPI Gregory Crane is a 49 y.o. male with history of hypertension, diabetes, CVA and anemia recently diagnosed with COVID-19 last Friday presents to the emergency department secondary to worsening cough, dyspnea, generalized weakness, fever, altered taste and smell.  Patient did confirm that he did not receive the COVID-19 vaccine.        Past Medical History:  Diagnosis Date  . Allergy   . Anemia   . Bell's palsy   . Diabetes mellitus without complication (HCC)    diet controlled  . Hypertension   . Hypothyroidism   . Kidney stones   . Pseudotumor cerebri   . Stroke [facility])     Patient Active Problem List   Diagnosis Date Noted  . Chronic kidney disease (CKD), stage III (moderate) 06/27/2019  . Acquired trigger finger 06/08/2019  . Lymphedema 06/08/2019  . Anemia 04/17/2019  . Swelling of left lower extremity 01/10/2019  . Facial droop 04/09/2018  . Daytime somnolence 03/30/2018  . Carotid stenosis 02/03/2018  . Intracranial vascular stenosis 09/12/2017  . Cough 01/20/2017  . Bell's palsy 11/10/2016  . History of CVA (cerebrovascular accident) 11/10/2016  . 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 History of nephrolithiasis 10/27/2016  . TIA (transient ischemic attack) 10/20/2016  . Near syncope 06/23/2016  . 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 Neuropathy 08/06/2015  . Health care maintenance 08/06/2015  . Essential hypertension 08/06/2015  . Heme positive stool 10/10/2013  . Hypothyroidism 10/10/2013  . Microalbuminuria 10/10/2013  . Hyperlipidemia 10/10/2013  . B12 deficiency 10/10/2013  . Diabetes ([place])  07/04/2013  . Environmental allergies 07/04/2013    Past Surgical History:  Procedure Laterality Date  . COLONOSCOPY WITH PROPOFOL N/A 03/30/2020   Procedure: COLONOSCOPY WITH PROPOFOL;  Surgeon: Lesly Rubenstein, MD;  Location: [facility] ENDOSCOPY;  Service: Endoscopy;  Laterality: N/A;  . LOOP RECORDER INSERTION N/A 01/27/2018   Procedure: LOOP RECORDER INSERTION;  Surgeon: [name], MD;  Location: [place] CV LAB;  Service: Cardiovascular;  Laterality: N/A;  . LUMBAR PUNCTURE     as child  . NO PAST SURGERIES    . TEE WITHOUT CARDIOVERSION N/A 01/07/2018   Procedure: TRANSESOPHAGEAL ECHOCARDIOGRAM (TEE);  Surgeon: Minna Merritts, MD;  Location: [facility] ORS;  Service: Cardiovascular;  Laterality: N/A;    Prior to Admission medications   Medication Sig Start Date End Date Taking? Authorizing Provider  amLODipine (NORVASC) 5 MG tablet Take 1 tablet (5 mg total) by mouth daily. Patient not taking: Reported on 12/09/2019 04/12/19   [name], MD  aspirin (ASPIRIN LOW DOSE) 81 MG EC tablet TAKE 1 TABLET(81 MG) BY MOUTH DAILY 01/26/20   [name], MD  atorvastatin (LIPITOR) 80 MG tablet TAKE 1 TABLET(80 MG) BY MOUTH DAILY 02/14/20   [name], MD  clopidogrel (PLAVIX) 75 MG tablet TAKE 1 TABLET(75 MG) BY MOUTH DAILY 10/25/19   [name], MD  cyanocobalamin (,VITAMIN B-12,) 1000 MCG/ML injection Inject 1 mL into the skin once a week for 3 weeks then once every 30 days. 06/17/19   [name], MD  DULoxetine (CYMBALTA) 20 MG capsule Take 20 mg by mouth daily.  01/09/18   [provider]  glucose  blood (FREESTYLE LITE) test strip Check blood sugars twice a day (Dx. 250.02) 04/18/17   [name], MD  HUMALOG 100 UNIT/ML injection INJECT 4 TO 6 UNITS UNDER THE SKIN THREE TIMES DAILY 11/16/18   [name], MD  hydrALAZINE (APRESOLINE) 50 MG tablet TAKE 1 TABLET(50 MG) BY MOUTH THREE TIMES DAILY 02/03/20   [name], MD  insulin glargine (LANTUS) 100  UNIT/ML injection ADMINISTER 15 UNITS UNDER THE SKIN EVERY NIGHT AT BEDTIME 02/16/20   [name], MD  Insulin Syringe-Needle U-100 (INSULIN SYRINGE .5CC/30GX5/16") 30G X 5/16" 0.5 ML MISC USE AS DIRECTED WITH INSULIN 09/14/19   [name], MD  levothyroxine (SYNTHROID) 125 MCG tablet Take 1 tablet (125 mcg total) by mouth daily before breakfast. 09/14/19   [name], MD  losartan (COZAAR) 100 MG tablet TAKE 1 TABLET BY MOUTH DAILY 01/20/20   [name], MD  [facility] DELICA LANCETS FINE MISC 1 applicator by Other route 4 (four) times daily. 10/06/17   [provider]  OZEMPIC, 1 MG/DOSE, 4 MG/3ML SOPN INJECT 1MG  INTO THE SKIN ONCE A WEEK 03/06/20   [name], MD  pregabalin (LYRICA) 100 MG capsule TAKE 1 CAPSULE(100 MG) BY MOUTH THREE TIMES DAILY 12/02/19   [name], MD  Syringe/Needle, Disp, (SYRINGE 3CC/25GX1") 25G X 1" 3 ML MISC Use as directed to administer b12 injections. 06/21/19   [name], MD    Allergies Patient has no known allergies.  Family History  Problem Relation Age of Onset  . Breast cancer Mother   . Diabetes Father   . Diabetes Sister   . Arthritis Maternal Grandmother   . Diabetes Maternal Grandmother     Social History Social History   Tobacco Use  . Smoking status: Never Smoker  . Smokeless tobacco: Never Used  Vaping Use  . Vaping Use: Never used  Substance Use Topics  . Alcohol use: No    Alcohol/week: 0.0 standard drinks    Comment: very rarely (1-2/year)  . Drug use: No    Review of Systems Constitutional: No fever/chills Eyes: No visual changes. ENT: No sore throat. Cardiovascular: Denies chest pain. Respiratory: Denies shortness of breath. Gastrointestinal: No abdominal pain.  No nausea, no vomiting.  No diarrhea.  No constipation. Genitourinary: Negative for dysuria. Musculoskeletal: Negative for neck pain.  Negative for back pain. Integumentary: Negative for rash. Neurological: Negative for  headaches, focal weakness or numbness.   ____________________________________________   PHYSICAL EXAM:  VITAL SIGNS: ED Triage Vitals  Enc Vitals Group     BP 04/03/20 2027 (!) 170/62     Pulse Rate 04/03/20 2027 81     Resp 04/03/20 2027 (!) 22     Temp 04/03/20 2027 99.3 F (37.4 C)     Temp Source 04/03/20 2027 Oral     SpO2 04/03/20 2027 92 %     Weight 04/03/20 2029 136.1 kg (300 lb)     Height 04/03/20 2029 1.829 m (6')     Head Circumference --      Peak Flow --      Pain Score 04/03/20 2028 0     Pain Loc --      Pain Edu? --      Excl. in [place]? --     Constitutional: Alert and oriented.  Apparent dyspnea Eyes: Conjunctivae are normal.  Head: Atraumatic. Mouth/Throat: Patient is wearing a mask. Neck: No stridor.  No meningeal signs.   Cardiovascular: Normal rate, regular rhythm. Good peripheral circulation. Grossly normal heart sounds.  Respiratory: Apnea, diffuse rhonchi. Gastrointestinal: Soft and nontender. No distention.  Musculoskeletal: No lower extremity tenderness nor edema. No gross deformities of extremities. Neurologic:  Normal speech and language. No gross focal neurologic deficits are appreciated.  Skin:  Skin is warm, dry and intact. Psychiatric: Mood and affect are normal. Speech and behavior are normal.  ____________________________________________   LABS (all labs ordered are listed, but only abnormal results are displayed)  Labs Reviewed  BASIC METABOLIC PANEL - Abnormal; Notable for the following components:      Result Value   Glucose, Bld 118 (*)    BUN 39 (*)    Creatinine, Ser 4.08 (*)    Calcium 7.7 (*)    GFR calc non Af Amer 16 (*)    GFR calc Af Amer 19 (*)    All other components within normal limits  CBC - Abnormal; Notable for the following components:   RBC 3.13 (*)    Hemoglobin 9.2 (*)    HCT 26.8 (*)    Platelets 142 (*)    All other components within normal limits  TROPONIN I (HIGH SENSITIVITY) - Abnormal; Notable  for the following components:   Troponin I (High Sensitivity) 150 (*)    All other components within normal limits  URINALYSIS, COMPLETE (UACMP) WITH MICROSCOPIC  TROPONIN I (HIGH SENSITIVITY)   ____________________________________________  EKG ED ECG REPORT I, [place] Mariyana Fulop, the attending physician, personally viewed and interpreted this ECG.   Date: 04/03/2020  EKG Time: 8:24 PM  Rate: 79  Rhythm: Normal sinus rhythm  Axis: Normal  Intervals: Normal  ST&T Change: None  ____________________________________  RADIOLOGY I, [place] Cande Mastropietro, personally viewed and evaluated these images (plain radiographs) as part of my medical decision making, as well as reviewing the written report by the radiologist.  ED MD interpretation: Small left pleural effusion with left lung base atelectasis or infiltrate on chest x-ray per radiologist.  Official radiology report(s): DG Chest 2 View  Result Date: 04/03/2020 CLINICAL DATA:  49 year old male with weakness. EXAM: CHEST - 2 VIEW COMPARISON:  Chest radiograph dated 09/12/2017 FINDINGS: Small left pleural effusion with left lung base densities which may represent atelectasis or infiltrate. The right lung is clear. No pneumothorax. There is mild cardiomegaly. A loop recorder device is noted. No acute osseous pathology. IMPRESSION: Small left pleural effusion with left lung base atelectasis or infiltrate. Electronically Signed   By: Anner Crete M.D.   On: 04/03/2020 20:50      Procedures   ____________________________________________   INITIAL IMPRESSION / MDM / ASSESSMENT AND PLAN / ED COURSE  As part of my medical decision making, I reviewed the following data within the electronic MEDICAL RECORD NUMBER  49 year old male presented with above-stated history and physical exam a differential diagnosis including but not limited to COVID-19 pneumonia with hypoxia.  Patient's oxygen saturation on arrival to room 90% on room air.  3 L of  oxygen via nasal cannula applied with resultant oxygen saturation 95%.  Decadron 10 mg IV ordered, remdesivir ordered.  In addition 2 L of IV normal saline also ordered given the fact that the patient's creatinine is increased to 4.08 from his previous value of 2.49 on 03/28/2020.  Plan to admit the patient to the hospitalist for further evaluation and management of COVID-19 pneumonia  ____________________________________________  FINAL CLINICAL IMPRESSION(S) / ED DIAGNOSES  Final diagnoses:  Pneumonia due to COVID-19 virus     MEDICATIONS GIVEN DURING THIS VISIT:  Medications  sodium chloride flush (  NS) 0.9 % injection 3 mL (3 mLs Intravenous Not Given 04/03/20 2315)  sodium chloride 0.9 % bolus 1,000 mL (has no administration in time range)  dexamethasone (DECADRON) injection 10 mg (has no administration in time range)     ED Discharge Orders    None      *Please note:  Gregory Crane was evaluated in Emergency Department on 04/03/2020 for the symptoms described in the history of present illness. He was evaluated in the context of the global COVID-19 pandemic, which necessitated consideration that the patient might be at risk for infection with the SARS-CoV-2 virus that causes COVID-19. Institutional protocols and algorithms that pertain to the evaluation of patients at risk for COVID-19 are in a state of rapid change based on information released by regulatory bodies including the CDC and federal and state organizations. These policies and algorithms were followed during the patient's care in the ED.  Some ED evaluations and interventions may be delayed as a result of limited staffing during and after the pandemic.*  Note:  This document was prepared using Dragon voice recognition software and may include unintentional dictation errors.   Gregor Hams, MD 04/04/20 [phone]

## 2020-04-03 NOTE — ED Notes (Signed)
Report given to Georgie RN 

## 2020-04-03 NOTE — Progress Notes (Signed)
Remdesivir - Pharmacy Brief Note   O:  CXR: IMPRESSION: Small left pleural effusion with left lung base atelectasis or infiltrate. SpO2: 93 - 96% on RA   A/P:  Remdesivir 200 mg IVPB once followed by 100 mg IVPB daily x 4 days.   Tobie Lords, PharmD, BCPS Clinical Pharmacist 04/03/2020 11:31 PM

## 2020-04-03 NOTE — ED Triage Notes (Signed)
Pt presents to ED with dizziness and weakness. Tested positive for COVID about week ago and today is experiencing worsening symptoms. Reports new productive cough for the past few days. Denies sob or chest pain.

## 2020-04-04 ENCOUNTER — Other Ambulatory Visit: Payer: Self-pay

## 2020-04-04 DIAGNOSIS — J1282 Pneumonia due to coronavirus disease 2019: Secondary | ICD-10-CM

## 2020-04-04 DIAGNOSIS — N186 End stage renal disease: Secondary | ICD-10-CM

## 2020-04-04 DIAGNOSIS — N184 Chronic kidney disease, stage 4 (severe): Secondary | ICD-10-CM

## 2020-04-04 DIAGNOSIS — U071 COVID-19: Principal | ICD-10-CM

## 2020-04-04 LAB — COMPREHENSIVE METABOLIC PANEL
ALT: 17 U/L (ref 0–44)
AST: 36 U/L (ref 15–41)
Albumin: 2.5 g/dL — ABNORMAL LOW (ref 3.5–5.0)
Alkaline Phosphatase: 46 U/L (ref 38–126)
Anion gap: 11 (ref 5–15)
BUN: 38 mg/dL — ABNORMAL HIGH (ref 6–20)
CO2: 20 mmol/L — ABNORMAL LOW (ref 22–32)
Calcium: 7.4 mg/dL — ABNORMAL LOW (ref 8.9–10.3)
Chloride: 107 mmol/L (ref 98–111)
Creatinine, Ser: 3.69 mg/dL — ABNORMAL HIGH (ref 0.61–1.24)
GFR calc Af Amer: 21 mL/min — ABNORMAL LOW (ref >60)
GFR calc non Af Amer: 18 mL/min — ABNORMAL LOW (ref >60)
Glucose, Bld: 160 mg/dL — ABNORMAL HIGH (ref 70–99)
Potassium: 3.9 mmol/L (ref 3.5–5.1)
Sodium: 138 mmol/L (ref 135–145)
Total Bilirubin: 0.8 mg/dL (ref 0.3–1.2)
Total Protein: 5.8 g/dL — ABNORMAL LOW (ref 6.5–8.1)

## 2020-04-04 LAB — FIBRINOGEN: Fibrinogen: 545 mg/dL — ABNORMAL HIGH (ref 210–475)

## 2020-04-04 LAB — FIBRIN DERIVATIVES D-DIMER (ARMC ONLY): Fibrin derivatives D-dimer (ARMC): 1114.93 ng/mL (FEU) — ABNORMAL HIGH (ref 0.00–499.00)

## 2020-04-04 LAB — ABO/RH: ABO/RH(D): O POS

## 2020-04-04 LAB — LACTATE DEHYDROGENASE: LDH: 257 U/L — ABNORMAL HIGH (ref 98–192)

## 2020-04-04 LAB — FERRITIN: Ferritin: 534 ng/mL — ABNORMAL HIGH (ref 24–336)

## 2020-04-04 LAB — TROPONIN I (HIGH SENSITIVITY): Troponin I (High Sensitivity): 140 ng/L (ref <18)

## 2020-04-04 LAB — HIV ANTIBODY (ROUTINE TESTING W REFLEX): HIV Screen 4th Generation wRfx: NONREACTIVE

## 2020-04-04 LAB — GLUCOSE, CAPILLARY
Glucose-Capillary: 120 mg/dL — ABNORMAL HIGH (ref 70–99)
Glucose-Capillary: 175 mg/dL — ABNORMAL HIGH (ref 70–99)
Glucose-Capillary: 242 mg/dL — ABNORMAL HIGH (ref 70–99)
Glucose-Capillary: 243 mg/dL — ABNORMAL HIGH (ref 70–99)
Glucose-Capillary: 218 mg/dL — ABNORMAL HIGH (ref 70–99)

## 2020-04-04 LAB — PROCALCITONIN
Procalcitonin: 0.19 ng/mL
Procalcitonin: 0.17 ng/mL

## 2020-04-04 LAB — LACTIC ACID, PLASMA
Lactic Acid, Venous: 0.6 mmol/L (ref 0.5–1.9)
Lactic Acid, Venous: 0.6 mmol/L (ref 0.5–1.9)

## 2020-04-04 LAB — TRIGLYCERIDES: Triglycerides: 93 mg/dL (ref <150)

## 2020-04-04 LAB — C-REACTIVE PROTEIN: CRP: 5.8 mg/dL — ABNORMAL HIGH (ref <1.0)

## 2020-04-04 MED ORDER — HYDROCODONE-ACETAMINOPHEN 5-325 MG PO TABS: 1.0000 | ORAL_TABLET | ORAL | Status: DC | PRN

## 2020-04-04 MED ORDER — ASPIRIN EC 81 MG PO TBEC
81.0000 mg | DELAYED_RELEASE_TABLET | Freq: Every day | ORAL | Status: DC
  Administered 2020-04-04 – 2020-04-07 (×4): 81 mg via ORAL
  Filled 2020-04-04 (×4): qty 1

## 2020-04-04 MED ORDER — LEVOTHYROXINE SODIUM 25 MCG PO TABS
125.0000 ug | ORAL_TABLET | Freq: Every day | ORAL | Status: DC
  Administered 2020-04-05 – 2020-04-07 (×3): 125 ug via ORAL
  Filled 2020-04-04 (×3): qty 1

## 2020-04-04 MED ORDER — ADULT MULTIVITAMIN W/MINERALS CH
1.0000 | ORAL_TABLET | Freq: Every day | ORAL | Status: DC
  Administered 2020-04-04 – 2020-04-07 (×4): 1 via ORAL
  Filled 2020-04-04 (×4): qty 1

## 2020-04-04 MED ORDER — ALBUTEROL SULFATE HFA 108 (90 BASE) MCG/ACT IN AERS
2.0000 | INHALATION_SPRAY | Freq: Four times a day (QID) | RESPIRATORY_TRACT | Status: DC
  Administered 2020-04-04 – 2020-04-07 (×12): 2 via RESPIRATORY_TRACT
  Filled 2020-04-04 (×2): qty 6.7

## 2020-04-04 MED ORDER — LINAGLIPTIN 5 MG PO TABS
5.0000 mg | ORAL_TABLET | Freq: Every day | ORAL | Status: DC
  Administered 2020-04-04 – 2020-04-07 (×4): 5 mg via ORAL
  Filled 2020-04-04 (×5): qty 1

## 2020-04-04 MED ORDER — CLOPIDOGREL BISULFATE 75 MG PO TABS
75.0000 mg | ORAL_TABLET | Freq: Once | ORAL | Status: AC
  Administered 2020-04-04: 16:00:00 75 mg via ORAL
  Filled 2020-04-04: qty 1

## 2020-04-04 MED ORDER — INSULIN ASPART 100 UNIT/ML ~~LOC~~ SOLN
0.0000 [IU] | Freq: Three times a day (TID) | SUBCUTANEOUS | Status: DC
  Administered 2020-04-04: 7 [IU] via SUBCUTANEOUS
  Administered 2020-04-04: 10:00:00 4 [IU] via SUBCUTANEOUS
  Administered 2020-04-04: 7 [IU] via SUBCUTANEOUS
  Administered 2020-04-05: 09:00:00 4 [IU] via SUBCUTANEOUS
  Administered 2020-04-05 (×2): 7 [IU] via SUBCUTANEOUS
  Administered 2020-04-06 (×2): 3 [IU] via SUBCUTANEOUS
  Administered 2020-04-06: 13:00:00 4 [IU] via SUBCUTANEOUS
  Administered 2020-04-07: 09:00:00 3 [IU] via SUBCUTANEOUS
  Filled 2020-04-04 (×8): qty 1

## 2020-04-04 MED ORDER — GUAIFENESIN-DM 100-10 MG/5ML PO SYRP
10.0000 mL | ORAL_SOLUTION | ORAL | Status: DC | PRN
  Filled 2020-04-04: qty 10

## 2020-04-04 MED ORDER — HYDRALAZINE HCL 20 MG/ML IJ SOLN
10.0000 mg | Freq: Once | INTRAMUSCULAR | Status: AC
  Administered 2020-04-04: 10 mg via INTRAVENOUS
  Filled 2020-04-04: qty 1

## 2020-04-04 MED ORDER — ZINC SULFATE 220 (50 ZN) MG PO CAPS
220.0000 mg | ORAL_CAPSULE | Freq: Every day | ORAL | Status: DC
  Administered 2020-04-04 – 2020-04-07 (×4): 220 mg via ORAL
  Filled 2020-04-04 (×4): qty 1

## 2020-04-04 MED ORDER — ENOXAPARIN SODIUM 40 MG/0.4ML ~~LOC~~ SOLN
40.0000 mg | SUBCUTANEOUS | Status: DC
  Administered 2020-04-04: 40 mg via SUBCUTANEOUS
  Filled 2020-04-04: qty 0.4

## 2020-04-04 MED ORDER — ACETAMINOPHEN 325 MG PO TABS: 650.0000 mg | ORAL_TABLET | Freq: Four times a day (QID) | ORAL | Status: DC | PRN

## 2020-04-04 MED ORDER — AMLODIPINE BESYLATE 5 MG PO TABS
5.0000 mg | ORAL_TABLET | Freq: Every day | ORAL | Status: DC
  Administered 2020-04-04 – 2020-04-05 (×2): 5 mg via ORAL
  Filled 2020-04-04 (×2): qty 1

## 2020-04-04 MED ORDER — HYDROCOD POLST-CPM POLST ER 10-8 MG/5ML PO SUER
5.0000 mL | Freq: Two times a day (BID) | ORAL | Status: DC | PRN
  Administered 2020-04-04 – 2020-04-05 (×2): 5 mL via ORAL
  Filled 2020-04-04 (×2): qty 5

## 2020-04-04 MED ORDER — DEXAMETHASONE SODIUM PHOSPHATE 10 MG/ML IJ SOLN
6.0000 mg | Freq: Every day | INTRAMUSCULAR | Status: DC
  Administered 2020-04-04 – 2020-04-07 (×4): 6 mg via INTRAVENOUS
  Filled 2020-04-04 (×4): qty 1

## 2020-04-04 MED ORDER — ONDANSETRON HCL 4 MG PO TABS: 4.0000 mg | ORAL_TABLET | Freq: Four times a day (QID) | ORAL | Status: DC | PRN

## 2020-04-04 MED ORDER — ASCORBIC ACID 500 MG PO TABS
500.0000 mg | ORAL_TABLET | Freq: Every day | ORAL | Status: DC
  Administered 2020-04-04 – 2020-04-07 (×4): 500 mg via ORAL
  Filled 2020-04-04 (×4): qty 1

## 2020-04-04 MED ORDER — ENOXAPARIN SODIUM 30 MG/0.3ML ~~LOC~~ SOLN
30.0000 mg | SUBCUTANEOUS | Status: DC
  Filled 2020-04-04: qty 0.3

## 2020-04-04 MED ORDER — ONDANSETRON HCL 4 MG/2ML IJ SOLN: 4.0000 mg | Freq: Four times a day (QID) | INTRAMUSCULAR | Status: DC | PRN

## 2020-04-04 MED ORDER — INSULIN GLARGINE 100 UNIT/ML ~~LOC~~ SOLN
15.0000 [IU] | Freq: Every day | SUBCUTANEOUS | Status: DC
  Administered 2020-04-04 – 2020-04-06 (×3): 15 [IU] via SUBCUTANEOUS
  Filled 2020-04-04 (×4): qty 0.15

## 2020-04-04 MED ORDER — HYDRALAZINE HCL 50 MG PO TABS
50.0000 mg | ORAL_TABLET | Freq: Three times a day (TID) | ORAL | Status: DC
  Administered 2020-04-04 – 2020-04-07 (×9): 50 mg via ORAL
  Filled 2020-04-04 (×9): qty 1

## 2020-04-04 MED ORDER — SODIUM CHLORIDE 0.9 % IV SOLN: 100.0000 mg | Freq: Every day | INTRAVENOUS | Status: DC

## 2020-04-04 MED ORDER — DULOXETINE HCL 20 MG PO CPEP
20.0000 mg | ORAL_CAPSULE | Freq: Every day | ORAL | Status: DC
  Administered 2020-04-04 – 2020-04-07 (×4): 20 mg via ORAL
  Filled 2020-04-04 (×4): qty 1

## 2020-04-04 MED ORDER — ATORVASTATIN CALCIUM 20 MG PO TABS
80.0000 mg | ORAL_TABLET | Freq: Every day | ORAL | Status: DC
  Administered 2020-04-04 – 2020-04-07 (×4): 80 mg via ORAL
  Filled 2020-04-04 (×6): qty 4

## 2020-04-04 MED ORDER — PREGABALIN 50 MG PO CAPS
100.0000 mg | ORAL_CAPSULE | Freq: Three times a day (TID) | ORAL | Status: DC
  Administered 2020-04-04 – 2020-04-07 (×9): 100 mg via ORAL
  Filled 2020-04-04 (×9): qty 2

## 2020-04-04 MED ORDER — LOSARTAN POTASSIUM 50 MG PO TABS
100.0000 mg | ORAL_TABLET | Freq: Every day | ORAL | Status: DC
  Administered 2020-04-04 – 2020-04-07 (×4): 100 mg via ORAL
  Filled 2020-04-04 (×4): qty 2

## 2020-04-04 MED ORDER — INSULIN ASPART 100 UNIT/ML ~~LOC~~ SOLN
0.0000 [IU] | Freq: Every day | SUBCUTANEOUS | Status: DC
  Administered 2020-04-04 – 2020-04-06 (×2): 2 [IU] via SUBCUTANEOUS
  Filled 2020-04-04 (×3): qty 1

## 2020-04-04 MED ORDER — SODIUM CHLORIDE 0.9 % IV SOLN: 200.0000 mg | Freq: Once | INTRAVENOUS | Status: DC

## 2020-04-04 NOTE — ED Notes (Signed)
Pt can show proof of positive covid result from collected sample 8/6. Not going to complete another covid swab. EDP Brown verbal d/c on order.

## 2020-04-04 NOTE — Progress Notes (Signed)
D/C tele monitor per Dr Reesa Chew, keep cont pulse ox

## 2020-04-04 NOTE — Progress Notes (Signed)
PROGRESS NOTE    Gregory Crane [facility]  OVF:[id number] DOB: 1971-04-10 DOA: 04/03/2020 PCP: [name], MD   Brief Narrative: Taken from H&P Gregory Crane is a 49 y.o. male with medical history significant for  DM, HTN, history of CVA, CKD 4, morbid obesity, with Covid exposure in the home and unvaccinated against Covid who presents with cough, shortness of breath and weakness worsening over the past few days associated with loss of taste.  Patient had a recent colonoscopy on 03/30/2020 and tested negative for Covid prior to his procedure on 03/28/2020 but after he developed symptoms he retested and tested positive. On arrival he was hypoxic requiring 2 to 3 L, chest x-ray with left lung basilar density possible infiltrate, AKI with CKD, elevated troponin. started on remdesivir and steroid.  Subjective: Patient has no new complaint today.  Continue to have some cough.  Unvaccinated, stating that he has no intention to get vaccine.  Assessment & Plan:   Principal Problem:   Pneumonia due to COVID-19 virus Active Problems:   Diabetes ([place])   Hypothyroidism   History of CVA (cerebrovascular accident)   AKI (acute kidney injury) ([place])   Elevated troponin   Obesity, Class III, BMI 40-49.9 (morbid obesity) (HCC)   CKD (chronic kidney disease) stage 4, GFR 15-29 ml/min (HCC)  Acute hypoxic respiratory failure secondary to COVID-19 pneumonia. Elevated inflammatory markers.  Initial procalcitonin at 0.19. He was saturating in high 90s on 3 L-decreased oxygen to 2 L. -Try weaning him off. -Continue remdesivir and Decadron-day 2 -Continue to monitor inflammatory markers. -Recheck procalcitonin-if continues to rise we will add antibiotics for superadded bacterial infection. -Continue supportive care.  Elevated troponin.  Most likely secondary to demand.  Peaked at 150, not trending down.  No chest pain or EKG changes.  AKI with CKD stage IV.  Elevated creatinine up to 4.08 on arrival, started  trending down, 3.69 today.  With baseline around 2.4-2.5. -Gentle IV fluid. -Continue to monitor. -Avoid nephrotoxins.  Hypertension.  Blood pressure elevated. -Restart home meds which includes amlodipine, losartan and hydralazine.  Diabetes mellitus. -Check A1c -Add home Lantus 15 units at bedtime.-Patient might need more as he is on steroid. -Continue SSI.  Hypothyroidism. -Continue levothyroxine    History of CVA (cerebrovascular accident) -Continue aspirin, plavix and statins    Obesity, Class III, BMI 40-49.9 (morbid obesity) ([place]) -This complicates overall prognosis and care  Objective: Vitals:   04/04/20 0240 04/04/20 0308 04/04/20 0813 04/04/20 1202  BP:  (!) 169/76 (!) 184/88 (!) 175/88  Pulse: 69 67 66 70  Resp: (!) 21 18  16   Temp:  98.4 F (36.9 C) 97.8 F (36.6 C) 98 F (36.7 C)  TempSrc:  Oral Oral Oral  SpO2: 96% 98% 99% 96%  Weight:  (!) 136.1 kg    Height:  6' (1.829 m)      Intake/Output Summary (Last 24 hours) at 04/04/2020 1354 Last data filed at 04/04/2020 1258 Gross per 24 hour  Intake 1279.97 ml  Output 750 ml  Net 529.97 ml   Filed Weights   04/03/20 2029 04/04/20 0308  Weight: 136.1 kg (!) 136.1 kg    Examination:  General exam: Morbidly obese gentleman, appears calm and comfortable  Respiratory system: Clear to auscultation. Respiratory effort normal. Cardiovascular system: S1 & S2 heard, RRR. No JVD, murmurs, Gastrointestinal system: Soft, nontender, nondistended, bowel sounds positive. Central nervous system: Alert and oriented. No focal neurological deficits. Extremities: No edema, no cyanosis, pulses intact and symmetrical.  Skin: No rashes, lesions or ulcers Psychiatry: Judgement and insight appear normal. Mood & affect appropriate.    DVT prophylaxis: Lovenox Code Status: Full Family Communication: Updated the wife, she is also admitted with COVID-19 pneumonia. Disposition Plan:  Status is: Inpatient  Remains inpatient  appropriate because:Inpatient level of care appropriate due to severity of illness   Dispo: The patient is from: Home              Anticipated d/c is to: Home              Anticipated d/c date is: 3 days              Patient currently is not medically stable to d/c.  Consultants:   None  Procedures:  Antimicrobials:   Data Reviewed: I have personally reviewed following labs and imaging studies  CBC: Recent Labs  Lab 04/03/20 2031  WBC 5.2  HGB 9.2*  HCT 26.8*  MCV 85.6  PLT 478*   Basic Metabolic Panel: Recent Labs  Lab 04/03/20 2031 04/04/20 0341  NA 139 138  K 4.3 3.9  CL 107 107  CO2 23 20*  GLUCOSE 118* 160*  BUN 39* 38*  CREATININE 4.08* 3.69*  CALCIUM 7.7* 7.4*   GFR: Estimated Creatinine Clearance: 34.6 mL/min (A) (by C-G formula based on SCr of 3.69 mg/dL (H)). Liver Function Tests: Recent Labs  Lab 04/04/20 0341  AST 36  ALT 17  ALKPHOS 46  BILITOT 0.8  PROT 5.8*  ALBUMIN 2.5*   No results for input(s): LIPASE, AMYLASE in the last 168 hours. No results for input(s): AMMONIA in the last 168 hours. Coagulation Profile: No results for input(s): INR, PROTIME in the last 168 hours. Cardiac Enzymes: No results for input(s): CKTOTAL, CKMB, CKMBINDEX, TROPONINI in the last 168 hours. BNP (last 3 results) No results for input(s): PROBNP in the last 8760 hours. HbA1C: No results for input(s): HGBA1C in the last 72 hours. CBG: Recent Labs  Lab 03/30/20 0944 04/04/20 0138 04/04/20 0811 04/04/20 1221  GLUCAP 149* 120* 175* 242*   Lipid Profile: Recent Labs    04/04/20 0341  TRIG 93   Thyroid Function Tests: No results for input(s): TSH, T4TOTAL, FREET4, T3FREE, THYROIDAB in the last 72 hours. Anemia Panel: Recent Labs    04/04/20 0341  FERRITIN 534*   Sepsis Labs: Recent Labs  Lab 04/04/20 0005 04/04/20 0341  PROCALCITON  --  0.19  LATICACIDVEN 0.6 0.6    Recent Results (from the past 240 hour(s))  SARS CORONAVIRUS 2 (TAT  6-24 HRS) Nasopharyngeal Nasopharyngeal Swab     Status: None   Collection Time: 03/28/20 11:31 AM   Specimen: Nasopharyngeal Swab  Result Value Ref Range Status   SARS Coronavirus 2 NEGATIVE NEGATIVE Final    Comment: (NOTE) SARS-CoV-2 target nucleic acids are NOT DETECTED.  The SARS-CoV-2 RNA is generally detectable in upper and lower respiratory specimens during the acute phase of infection. Negative results do not preclude SARS-CoV-2 infection, do not rule out co-infections with other pathogens, and should not be used as the sole basis for treatment or other patient management decisions. Negative results must be combined with clinical observations, patient history, and epidemiological information. The expected result is Negative.  Fact Sheet for Patients: SugarRoll.be  Fact Sheet for Healthcare Providers: https://www.woods-mathews.com/  This test is not yet approved or cleared by the [place] FDA and  has been authorized for detection and/or diagnosis of SARS-CoV-2 by FDA under an Emergency Use Authorization (  EUA). This EUA will remain  in effect (meaning this test can be used) for the duration of the COVID-19 declaration under Se ction 564(b)(1) of the Act, 21 U.S.C. section 360bbb-3(b)(1), unless the authorization is terminated or revoked sooner.  Performed at [facility], [place] [address], [place], [place] [address]   Blood Culture (routine x 2)     Status: None (Preliminary result)   Collection Time: 04/04/20 12:00 AM   Specimen: BLOOD  Result Value Ref Range Status   Specimen Description BLOOD LEFT ARM  Final   Special Requests   Final    BOTTLES DRAWN AEROBIC AND ANAEROBIC Blood Culture adequate volume   Culture   Final    NO GROWTH < 12 HOURS Performed at [facility], [address] [place], [place] [address]    Report Status PENDING  Incomplete  Blood Culture (routine x 2)     Status: None  (Preliminary result)   Collection Time: 04/04/20 12:48 AM   Specimen: BLOOD  Result Value Ref Range Status   Specimen Description BLOOD RIGHT [facility]  Final   Special Requests   Final    BOTTLES DRAWN AEROBIC AND ANAEROBIC Blood Culture adequate volume   Culture   Final    NO GROWTH < 12 HOURS Performed at [facility], [address] [place], [place] [address]    Report Status PENDING  Incomplete     Radiology Studies: DG Chest 2 View  Result Date: 04/03/2020 CLINICAL DATA:  49 year old male with weakness. EXAM: CHEST - 2 VIEW COMPARISON:  Chest radiograph dated 09/12/2017 FINDINGS: Small left pleural effusion with left lung base densities which may represent atelectasis or infiltrate. The right lung is clear. No pneumothorax. There is mild cardiomegaly. A loop recorder device is noted. No acute osseous pathology. IMPRESSION: Small left pleural effusion with left lung base atelectasis or infiltrate. Electronically Signed   By: Anner Crete M.D.   On: 04/03/2020 20:50    Scheduled Meds: . albuterol  2 puff Inhalation Q6H  . amLODipine  5 mg Oral Daily  . vitamin C  500 mg Oral Daily  . aspirin  81 mg Oral Daily  . atorvastatin  80 mg Oral Daily  . clopidogrel  75 mg Oral Once  . dexamethasone (DECADRON) injection  6 mg Intravenous Daily  . DULoxetine  20 mg Oral Daily  . enoxaparin (LOVENOX) injection  30 mg Subcutaneous Q24H  . hydrALAZINE  50 mg Oral Q8H  . insulin aspart  0-20 Units Subcutaneous TID WC  . insulin aspart  0-5 Units Subcutaneous QHS  . [START ON 04/05/2020] levothyroxine  125 mcg Oral QAC breakfast  . linagliptin  5 mg Oral Daily  . losartan  100 mg Oral Daily  . multivitamin with minerals  1 tablet Oral Daily  . pregabalin  100 mg Oral TID  . sodium chloride flush  3 mL Intravenous Once  . zinc sulfate  220 mg Oral Daily   Continuous Infusions: . remdesivir 100 mg in NS 100 mL 100 mg (04/04/20 0959)     LOS: 1 day   Time spent: 45  minutes.  [name], MD [facility]  If 7PM-7AM, please contact night-coverage [email or web address]  04/04/2020, 1:54 PM   This record has been created using Systems analyst. Errors have been sought and corrected,but may not always be located. Such creation errors do not reflect on the standard of care.

## 2020-04-04 NOTE — Progress Notes (Signed)
Anticoagulation monitoring(Lovenox):  49yo  male ordered Lovenox 30 mg Q24h for DVT prevention  Filed Weights   04/03/20 2029 04/04/20 0308  Weight: 136.1 kg (300 lb) (!) 136.1 kg (300 lb 0.7 oz)   BMI 40   Lab Results  Component Value Date   CREATININE 3.69 (H) 04/04/2020   CREATININE 4.08 (H) 04/03/2020   CREATININE 2.49 (H) 03/28/2020   Estimated Creatinine Clearance: 34.6 mL/min (A) (by C-G formula based on SCr of 3.69 mg/dL (H)). Hemoglobin & Hematocrit     Component Value Date/Time   HGB 9.2 (L) 04/03/2020 2031   HGB 10.9 (L) 02/22/2020 1154   HCT 26.8 (L) 04/03/2020 2031   HCT 32.6 (L) 02/22/2020 1154     Per Protocol for Patient with estCrcl > 30 ml/min and BMI < 40, will transition to Lovenox 40 mg Q24h. If renal function continues to improve may need to increase to q12h.     Paulina Fusi, PharmD, BCPS 04/04/2020 3:02 PM

## 2020-04-04 NOTE — Progress Notes (Signed)
Patients BP around 8 was 184/88 HR 66 and at 9:00 it was 190/101. He does not have anything scheduled or PRN. Messaged MD, new order for Hydralazine 10mg .

## 2020-04-04 NOTE — ED Notes (Signed)
Report provided to Junie Panning, RN by this RN.

## 2020-04-04 NOTE — Plan of Care (Signed)
  Problem: Education: Goal: Knowledge of  General Education information/materials will improve Outcome: Progressing Goal: Emotional status will improve Outcome: Progressing Goal: Mental status will improve Outcome: Progressing Goal: Verbalization of understanding the information provided will improve Outcome: Progressing   Problem: Activity: Goal: Interest or engagement in activities will improve Outcome: Progressing Goal: Sleeping patterns will improve Outcome: Progressing   

## 2020-04-04 NOTE — H&P (Signed)
History and Physical    Gregory Crane AJO:[id number] DOB: 03-04-1971 DOA: 04/03/2020  PCP: [name], MD   Patient coming from: home  I have personally briefly reviewed patient's old medical records in [place]  Chief Complaint: Shortness of breath, Covid positive  HPI: Gregory Crane is a 49 y.o. male with medical history significant for  DM, HTN, history of CVA, CKD 4, morbid obesity, with Covid exposure in the home and unvaccinated against Covid who presents with cough, shortness of breath and weakness worsening over the past few days associated with loss of taste.  Patient had a recent colonoscopy on 03/30/2020 and tested negative for Covid prior to his procedure on 03/28/2020 but after he developed symptoms he retested and tested positive. ED Course: On arrival he had a low-grade temperature of 99.2, tachypneic at 22, mild hypoxia at 92% on room air.  Covid test not repeated due to positive testing on his phone.  Chest x-ray showed left lung basal density possible infiltrate.  Creatinine 4.08, up from baseline of 2.49, troponin 150, hemoglobin 9.2 down from baseline of around 10.  His recent colonoscopy showed some polyps.  EKG showed normal sinus rhythm, no acute ST-T wave changes.  Hospitalist consulted for admission  Review of Systems: As per HPI otherwise all other systems on review of systems negative.    Past Medical History:  Diagnosis Date  . Allergy   . Anemia   . Bell's palsy   . Diabetes mellitus without complication (HCC)    diet controlled  . Hypertension   . Hypothyroidism   . Kidney stones   . Pseudotumor cerebri   . Stroke [facility])     Past Surgical History:  Procedure Laterality Date  . COLONOSCOPY WITH PROPOFOL N/A 03/30/2020   Procedure: COLONOSCOPY WITH PROPOFOL;  Surgeon: Lesly Rubenstein, MD;  Location: [facility] ENDOSCOPY;  Service: Endoscopy;  Laterality: N/A;  . LOOP RECORDER INSERTION N/A 01/27/2018   Procedure: LOOP RECORDER INSERTION;  Surgeon:  [name], MD;  Location: [place] [facility] LAB;  Service: Cardiovascular;  Laterality: N/A;  . LUMBAR PUNCTURE     as child  . NO PAST SURGERIES    . TEE WITHOUT CARDIOVERSION N/A 01/07/2018   Procedure: TRANSESOPHAGEAL ECHOCARDIOGRAM (TEE);  Surgeon: Minna Merritts, MD;  Location: [facility] ORS;  Service: Cardiovascular;  Laterality: N/A;     reports that he has never smoked. He has never used smokeless tobacco. He reports that he does not drink alcohol and does not use drugs.  No Known Allergies  Family History  Problem Relation Age of Onset  . Breast cancer Mother   . Diabetes Father   . Diabetes Sister   . Arthritis Maternal Grandmother   . Diabetes Maternal Grandmother       Prior to Admission medications   Medication Sig Start Date End Date Taking? Authorizing Provider  amLODipine (NORVASC) 5 MG tablet Take 1 tablet (5 mg total) by mouth daily. Patient not taking: Reported on 12/09/2019 04/12/19   [name], MD  aspirin (ASPIRIN LOW DOSE) 81 MG EC tablet TAKE 1 TABLET(81 MG) BY MOUTH DAILY 01/26/20   [name], MD  atorvastatin (LIPITOR) 80 MG tablet TAKE 1 TABLET(80 MG) BY MOUTH DAILY 02/14/20   [name], MD  clopidogrel (PLAVIX) 75 MG tablet TAKE 1 TABLET(75 MG) BY MOUTH DAILY 10/25/19   [name], MD  cyanocobalamin (,VITAMIN B-12,) 1000 MCG/ML injection Inject 1 mL into the skin once a week for 3  weeks then once every 30 days. 06/17/19   [name], MD  DULoxetine (CYMBALTA) 20 MG capsule Take 20 mg by mouth daily.  01/09/18   [provider]  glucose blood (FREESTYLE LITE) test strip Check blood sugars twice a day (Dx. 250.02) 04/18/17   [name], MD  HUMALOG 100 UNIT/ML injection INJECT 4 TO 6 UNITS UNDER THE SKIN THREE TIMES DAILY 11/16/18   [name], MD  hydrALAZINE (APRESOLINE) 50 MG tablet TAKE 1 TABLET(50 MG) BY MOUTH THREE TIMES DAILY 02/03/20   [name], MD  insulin glargine (LANTUS) 100 UNIT/ML injection  ADMINISTER 15 UNITS UNDER THE SKIN EVERY NIGHT AT BEDTIME 02/16/20   [name], MD  Insulin Syringe-Needle U-100 (INSULIN SYRINGE .5CC/30GX5/16") 30G X 5/16" 0.5 ML MISC USE AS DIRECTED WITH INSULIN 09/14/19   [name], MD  levothyroxine (SYNTHROID) 125 MCG tablet Take 1 tablet (125 mcg total) by mouth daily before breakfast. 09/14/19   [name], MD  losartan (COZAAR) 100 MG tablet TAKE 1 TABLET BY MOUTH DAILY 01/20/20   [name], MD  [facility] DELICA LANCETS FINE MISC 1 applicator by Other route 4 (four) times daily. 10/06/17   [provider]  OZEMPIC, 1 MG/DOSE, 4 MG/3ML SOPN INJECT 1MG  INTO THE SKIN ONCE A WEEK 03/06/20   [name], MD  pregabalin (LYRICA) 100 MG capsule TAKE 1 CAPSULE(100 MG) BY MOUTH THREE TIMES DAILY 12/02/19   [name], MD  Syringe/Needle, Disp, (SYRINGE 3CC/25GX1") 25G X 1" 3 ML MISC Use as directed to administer b12 injections. 06/21/19   [name], MD    Physical Exam: Vitals:   04/03/20 2027 04/03/20 2029 04/03/20 2246  BP: (!) 170/62  (!) 169/75  Pulse: 81  77  Resp: (!) 22  (!) 22  Temp: 99.3 F (37.4 C)    TempSrc: Oral    SpO2: 92%  93%  Weight:  136.1 kg   Height:  6' (1.829 m)      Vitals:   04/03/20 2027 04/03/20 2029 04/03/20 2246  BP: (!) 170/62  (!) 169/75  Pulse: 81  77  Resp: (!) 22  (!) 22  Temp: 99.3 F (37.4 C)    TempSrc: Oral    SpO2: 92%  93%  Weight:  136.1 kg   Height:  6' (1.829 m)       Constitutional:  Alert, but tachypneic speaking in short sentences HEENT:      Head: Normocephalic and atraumatic.         Eyes: PERLA, EOMI, Conjunctivae are normal. Sclera is non-icteric.       Mouth/Throat: Mucous membranes are moist.       Neck: Supple with no signs of meningismus. Cardiovascular: Regular rate and rhythm. No murmurs, gallops, or rubs. 2+ symmetrical distal pulses are present . No JVD. No LE edema Respiratory: Respiratory effort increased.Lungs sounds diminished, mostly  left lung base. No wheezes, crackles, or rhonchi.  Gastrointestinal: Soft, non tender, and non distended with positive bowel sounds. No rebound or guarding. Genitourinary: No CVA tenderness. Musculoskeletal: Nontender with normal range of motion in all extremities. No cyanosis, or erythema of extremities. Neurologic: Normal speech and language. Face is symmetric. Moving all extremities. No gross focal neurologic deficits . Skin: Skin is warm, dry.  No rash or ulcers Psychiatric: Appears a bit anxious.  Speech and behavior are normal   Labs on Admission: I have personally reviewed following labs and imaging studies  CBC: Recent Labs  Lab 04/03/20 2031  WBC 5.2  HGB 9.2*  HCT 26.8*  MCV 85.6  PLT 607*   Basic Metabolic Panel: Recent Labs  Lab 03/28/20 1202 04/03/20 2031  NA 145* 139  K 4.6 4.3  CL 111* 107  CO2 24 23  GLUCOSE 131* 118*  BUN 31* 39*  CREATININE 2.49* 4.08*  CALCIUM 8.5* 7.7*   GFR: Estimated Creatinine Clearance: 31.3 mL/min (A) (by C-G formula based on SCr of 4.08 mg/dL (H)). Liver Function Tests: No results for input(s): AST, ALT, ALKPHOS, BILITOT, PROT, ALBUMIN in the last 168 hours. No results for input(s): LIPASE, AMYLASE in the last 168 hours. No results for input(s): AMMONIA in the last 168 hours. Coagulation Profile: No results for input(s): INR, PROTIME in the last 168 hours. Cardiac Enzymes: No results for input(s): CKTOTAL, CKMB, CKMBINDEX, TROPONINI in the last 168 hours. BNP (last 3 results) No results for input(s): PROBNP in the last 8760 hours. HbA1C: No results for input(s): HGBA1C in the last 72 hours. CBG: Recent Labs  Lab 03/30/20 0944  GLUCAP 149*   Lipid Profile: No results for input(s): CHOL, HDL, LDLCALC, TRIG, CHOLHDL, LDLDIRECT in the last 72 hours. Thyroid Function Tests: No results for input(s): TSH, T4TOTAL, FREET4, T3FREE, THYROIDAB in the last 72 hours. Anemia Panel: No results for input(s): VITAMINB12, FOLATE,  FERRITIN, TIBC, IRON, RETICCTPCT in the last 72 hours. Urine analysis:    Component Value Date/Time   COLORURINE YELLOW (A) 09/12/2017 1722   APPEARANCEUR HAZY (A) 09/12/2017 1722   APPEARANCEUR Clear 01/27/2014 0247   LABSPEC 1.020 09/12/2017 1722   LABSPEC 1.033 01/27/2014 0247   PHURINE 5.0 09/12/2017 1722   GLUCOSEU >=500 (A) 09/12/2017 1722   GLUCOSEU >=500 01/27/2014 0247   HGBUR SMALL (A) 09/12/2017 1722   BILIRUBINUR NEGATIVE 09/12/2017 1722   BILIRUBINUR Negative 01/27/2014 0247   KETONESUR 20 (A) 09/12/2017 1722   PROTEINUR 100 (A) 09/12/2017 1722   NITRITE NEGATIVE 09/12/2017 1722   LEUKOCYTESUR NEGATIVE 09/12/2017 1722   LEUKOCYTESUR Negative 01/27/2014 0247    Radiological Exams on Admission: DG Chest 2 View  Result Date: 04/03/2020 CLINICAL DATA:  49 year old male with weakness. EXAM: CHEST - 2 VIEW COMPARISON:  Chest radiograph dated 09/12/2017 FINDINGS: Small left pleural effusion with left lung base densities which may represent atelectasis or infiltrate. The right lung is clear. No pneumothorax. There is mild cardiomegaly. A loop recorder device is noted. No acute osseous pathology. IMPRESSION: Small left pleural effusion with left lung base atelectasis or infiltrate. Electronically Signed   By: Anner Crete M.D.   On: 04/03/2020 20:50    EKG: Independently reviewed. Interpretation : Normal sinus rhythm with no acute ST-T wave changes  Assessment/Plan 49 year old male with history of DM, HTN, history of CVA, CKD 4, morbid obesity, with recent positive Covid test and unvaccinated against Covid who presents with worsening cough, shortness of breath and weakness  associated with loss of taste.  Chest x-ray with left basilar density possible pneumonia/atelectasis   Pneumonia due to COVID-19 virus, unvaccinated Hypoxia -Remdesivir, dexamethasone, antitussives, vitamins, albuterol -Supplemental oxygen as needed, proning as tolerated per protocol -Follow  inflammatory biomarkers    Elevated troponin -No complaints of chest pain and EKG nonacute -Suspect demand ischemia, possible myocarditis -Follow CRP -Continue to trend    AKI (acute kidney injury) ([place])   CKD (chronic kidney disease) stage 4, GFR 15-29 ml/min (HCC) -Creatinine 4.08 above baseline of 2.6 -One-time IV normal saline bolus given in the ER.  Continue to monitor    Diabetes ([place]) -Sliding scale  insulin coverage, Covid glycemic control protocol -   Hypothyroidism -Continue levothyroxine    History of CVA (cerebrovascular accident) -Continue aspirin and statins -   Obesity, Class III, BMI 40-49.9 (morbid obesity) ([place]) -This complicates overall prognosis and care    DVT prophylaxis: Lovenox  Code Status: full code  Family Communication:  none  Disposition Plan: Back to previous home environment Consults called: none  Status:At the time of admission, it appears that the appropriate admission status for this patient is INPATIENT. This is judged to be reasonable and necessary in order to provide the required intensity of service to ensure the patient's safety given the presenting symptoms, physical exam findings, and initial radiographic and laboratory data in the context of their  Comorbid conditions.   Patient requires inpatient status due to high intensity of service, high risk for further deterioration and high frequency of surveillance required.   I certify that at the point of admission it is my clinical judgment that the patient will require inpatient hospital care spanning beyond [place] MD [facility]     04/04/2020, 12:04 AM

## 2020-04-04 NOTE — ED Notes (Addendum)
Pt in with worsening covid symptoms. A&Ox4. Resp irregular at times; tachypnic; 96% on 2L; pt not normally on O2 at home; 2nd trop sent to lab. Pt denies CP; reports HA; denies N/V; reports one episode of diarrhea this morning; cough present. Pt shivering; states this is unrelated to covid as he has been doing this and feeling "cold" for about a year now. Reports came in for "extreme fatigue" and that he "couldn't walk earlier" due to the fatigue.

## 2020-04-04 NOTE — ED Notes (Signed)
Pharm tech heading to pharmacy now to collect Remdesivir.

## 2020-04-05 LAB — GLUCOSE, CAPILLARY
Glucose-Capillary: 184 mg/dL — ABNORMAL HIGH (ref 70–99)
Glucose-Capillary: 203 mg/dL — ABNORMAL HIGH (ref 70–99)
Glucose-Capillary: 211 mg/dL — ABNORMAL HIGH (ref 70–99)
Glucose-Capillary: 187 mg/dL — ABNORMAL HIGH (ref 70–99)

## 2020-04-05 LAB — MAGNESIUM: Magnesium: 2.3 mg/dL (ref 1.7–2.4)

## 2020-04-05 LAB — CBC WITH DIFFERENTIAL/PLATELET
Abs Immature Granulocytes: 0.02 10*3/uL (ref 0.00–0.07)
Basophils Absolute: 0 10*3/uL (ref 0.0–0.1)
Basophils Relative: 0 %
Eosinophils Absolute: 0 10*3/uL (ref 0.0–0.5)
Eosinophils Relative: 0 %
HCT: 27.8 % — ABNORMAL LOW (ref 39.0–52.0)
Hemoglobin: 9.7 g/dL — ABNORMAL LOW (ref 13.0–17.0)
Immature Granulocytes: 1 %
Lymphocytes Relative: 17 %
Lymphs Abs: 0.6 10*3/uL — ABNORMAL LOW (ref 0.7–4.0)
MCH: 28.9 pg (ref 26.0–34.0)
MCHC: 34.9 g/dL (ref 30.0–36.0)
MCV: 82.7 fL (ref 80.0–100.0)
Monocytes Absolute: 0.4 10*3/uL (ref 0.1–1.0)
Monocytes Relative: 11 %
Neutro Abs: 2.7 10*3/uL (ref 1.7–7.7)
Neutrophils Relative %: 71 %
Platelets: 154 10*3/uL (ref 150–400)
RBC: 3.36 MIL/uL — ABNORMAL LOW (ref 4.22–5.81)
RDW: 12.3 % (ref 11.5–15.5)
WBC: 3.8 10*3/uL — ABNORMAL LOW (ref 4.0–10.5)
nRBC: 0 % (ref 0.0–0.2)

## 2020-04-05 LAB — COMPREHENSIVE METABOLIC PANEL
ALT: 17 U/L (ref 0–44)
AST: 32 U/L (ref 15–41)
Albumin: 2.5 g/dL — ABNORMAL LOW (ref 3.5–5.0)
Alkaline Phosphatase: 51 U/L (ref 38–126)
Anion gap: 8 (ref 5–15)
BUN: 47 mg/dL — ABNORMAL HIGH (ref 6–20)
CO2: 22 mmol/L (ref 22–32)
Calcium: 7.6 mg/dL — ABNORMAL LOW (ref 8.9–10.3)
Chloride: 109 mmol/L (ref 98–111)
Creatinine, Ser: 3.33 mg/dL — ABNORMAL HIGH (ref 0.61–1.24)
GFR calc Af Amer: 24 mL/min — ABNORMAL LOW (ref >60)
GFR calc non Af Amer: 21 mL/min — ABNORMAL LOW (ref >60)
Glucose, Bld: 195 mg/dL — ABNORMAL HIGH (ref 70–99)
Potassium: 4.2 mmol/L (ref 3.5–5.1)
Sodium: 139 mmol/L (ref 135–145)
Total Bilirubin: 0.5 mg/dL (ref 0.3–1.2)
Total Protein: 6 g/dL — ABNORMAL LOW (ref 6.5–8.1)

## 2020-04-05 LAB — HEMOGLOBIN A1C
Hgb A1c MFr Bld: 6.7 % — ABNORMAL HIGH (ref 4.8–5.6)
Mean Plasma Glucose: 146 mg/dL

## 2020-04-05 LAB — C-REACTIVE PROTEIN: CRP: 4.7 mg/dL — ABNORMAL HIGH (ref <1.0)

## 2020-04-05 LAB — FERRITIN: Ferritin: 632 ng/mL — ABNORMAL HIGH (ref 24–336)

## 2020-04-05 LAB — FIBRIN DERIVATIVES D-DIMER (ARMC ONLY): Fibrin derivatives D-dimer (ARMC): 878.22 ng/mL (FEU) — ABNORMAL HIGH (ref 0.00–499.00)

## 2020-04-05 LAB — PROCALCITONIN: Procalcitonin: 0.17 ng/mL

## 2020-04-05 LAB — PHOSPHORUS: Phosphorus: 4.5 mg/dL (ref 2.5–4.6)

## 2020-04-05 MED ORDER — INSULIN ASPART 100 UNIT/ML ~~LOC~~ SOLN
5.0000 [IU] | Freq: Three times a day (TID) | SUBCUTANEOUS | Status: DC
  Administered 2020-04-05 – 2020-04-07 (×5): 5 [IU] via SUBCUTANEOUS
  Filled 2020-04-05 (×5): qty 1

## 2020-04-05 MED ORDER — AMLODIPINE BESYLATE 10 MG PO TABS
10.0000 mg | ORAL_TABLET | Freq: Every day | ORAL | Status: DC
  Administered 2020-04-06 – 2020-04-07 (×2): 10 mg via ORAL
  Filled 2020-04-05 (×2): qty 1

## 2020-04-05 MED ORDER — ENOXAPARIN SODIUM 40 MG/0.4ML ~~LOC~~ SOLN
40.0000 mg | Freq: Two times a day (BID) | SUBCUTANEOUS | Status: DC
  Administered 2020-04-05 – 2020-04-07 (×4): 40 mg via SUBCUTANEOUS
  Filled 2020-04-05 (×4): qty 0.4

## 2020-04-05 MED ORDER — SODIUM CHLORIDE 0.9 % IV SOLN
500.0000 mg | INTRAVENOUS | Status: DC
  Administered 2020-04-05 – 2020-04-06 (×2): 500 mg via INTRAVENOUS
  Filled 2020-04-05 (×3): qty 500

## 2020-04-05 NOTE — Progress Notes (Signed)
PHARMACIST - PHYSICIAN COMMUNICATION  CONCERNING:  Enoxaparin (Lovenox) for DVT Prophylaxis    RECOMMENDATION: Patient was prescribed enoxaparin 40mg  q24 hours for VTE prophylaxis.   Filed Weights   04/03/20 2029 04/04/20 0308  Weight: 136.1 kg (300 lb) (!) 136.1 kg (300 lb 0.7 oz)    Body mass index is 40.69 kg/m.  Estimated Creatinine Clearance: 38.3 mL/min (A) (by C-G formula based on SCr of 3.33 mg/dL (H)).   Based on [place] patient is candidate for enoxaparin 40mg  every 12 hour dosing due to BMI being >40.  DESCRIPTION: Pharmacy has adjusted enoxaparin dose per [facility]/ policy.  Patient is now receiving enoxaparin 40mg  every 12 hours.   Benn Moulder, PharmD Clinical Pharmacist  04/05/2020 10:52 AM

## 2020-04-05 NOTE — Progress Notes (Signed)
Inpatient Diabetes Program Recommendations  AACE/ADA: New Consensus Statement on Inpatient Glycemic Control   Target Ranges:  Prepandial:   less than 140 mg/dL      Peak postprandial:   less than 180 mg/dL (1-2 hours)      Critically ill patients:  140 - 180 mg/dL   Results for TRASE, BUNDA (MRN [id number]) as of 04/05/2020 12:28  Ref. Range 04/04/2020 08:11 04/04/2020 12:21 04/04/2020 15:53 04/04/2020 19:37 04/05/2020 08:02 04/05/2020 11:14  Glucose-Capillary Latest Ref Range: 70 - 99 mg/dL 175 (H) 242 (H) 243 (H) 218 (H) 184 (H) 203 (H)   Review of Glycemic Control  Diabetes history: DM2 Outpatient Diabetes medications: Lantus 15 units QHS, Humalog 4-6 units TID with meals, Ozempic 1 mg Qweek Current orders for Inpatient glycemic control: Lantus 15 units QHS, Tradjenta 5 mg daily, Novolog 0-20 units TID with meals, Novolog 0-5 units QHS; Decadron 6 mg daily  Inpatient Diabetes Program Recommendations:    Insulin-If steroids are continued, please consider ordering Novolog 5 units TID with meals for meal coverage if patient eats at least 50% of meals.  Thanks, Barnie Alderman, RN, MSN, CDE Diabetes Coordinator Inpatient Diabetes Program [phone] (Team Pager from 8am to 5pm)

## 2020-04-05 NOTE — Progress Notes (Signed)
PROGRESS NOTE    Gregory Crane [facility]  ZOX:[id number] DOB: Mar 13, 1971 DOA: 04/03/2020 PCP: [name], MD   Brief Narrative: Taken from H&P Gregory Crane is a 49 y.o. male with medical history significant for  DM, HTN, history of CVA, CKD 4, morbid obesity, with Covid exposure in the home and unvaccinated against Covid who presents with cough, shortness of breath and weakness worsening over the past few days associated with loss of taste.  Patient had a recent colonoscopy on 03/30/2020 and tested negative for Covid prior to his procedure on 03/28/2020 but after he developed symptoms he retested and tested positive. On arrival he was hypoxic requiring 2 to 3 L, chest x-ray with left lung basilar density possible infiltrate, AKI with CKD, elevated troponin. started on remdesivir and steroid.  Subjective: Patient was feeling little better when seen today.  Continues to have some cough.  He was worried about his wife who is currently in ICU with COVID-19 pneumonia and worsening hypoxia.  Assessment & Plan:   Principal Problem:   Pneumonia due to COVID-19 virus Active Problems:   Diabetes ([place])   Hypothyroidism   History of CVA (cerebrovascular accident)   AKI (acute kidney injury) ([place])   Elevated troponin   Obesity, Class III, BMI 40-49.9 (morbid obesity) (HCC)   CKD (chronic kidney disease) stage 4, GFR 15-29 ml/min (HCC)  Acute hypoxic respiratory failure secondary to COVID-19 pneumonia. Elevated inflammatory markers.  Initial procalcitonin at 0.19>>0.17 He was saturating in high 90s on 3 L-decreased oxygen to 1 L. -Try weaning him off. -Continue remdesivir and Decadron-day 3 -Continue to monitor inflammatory markers. -Azithromycin for 3 days. -Continue supportive care.  Elevated troponin.  Most likely secondary to demand.  Peaked at 150, not trending down.  No chest pain or EKG changes.  AKI with CKD stage IV.  Elevated creatinine up to 4.08 on arrival, started trending down  slowly, 3.33 today.  With baseline around 2.4-2.5. -Encourage p.o. hydration. -Continue to monitor. -Avoid nephrotoxins.  Hypertension.  Blood pressure elevated. -Increase home dose of amlodipine from 5-10. -Restart home losartan and hydralazine.  Diabetes mellitus.  CBG elevated -Check A1c- 6.7 -Continue home Lantus 15 units at bedtime. -Add 5 units of mealtime coverage. -Continue SSI.  Hypothyroidism. -Continue levothyroxine    History of CVA (cerebrovascular accident) -Continue aspirin, plavix and statins    Obesity, Class III, BMI 40-49.9 (morbid obesity) ([place]) -This complicates overall prognosis and care  Objective: Vitals:   04/05/20 0115 04/05/20 0555 04/05/20 0803 04/05/20 1119  BP: (!) 147/70 (!) 157/84 (!) 158/78 (!) 180/90  Pulse: 66 61  67  Resp: 18 16 17 17   Temp: 98.4 F (36.9 C) 98 F (36.7 C) 98 F (36.7 C) 97.7 F (36.5 C)  TempSrc:   Oral Oral  SpO2: 97% 98%  96%  Weight:      Height:        Intake/Output Summary (Last 24 hours) at 04/05/2020 1612 Last data filed at 04/05/2020 0500 Gross per 24 hour  Intake 220 ml  Output 1000 ml  Net -780 ml   Filed Weights   04/03/20 2029 04/04/20 0308  Weight: 136.1 kg (!) 136.1 kg    Examination:  General.  Developed obese gentleman, in no acute distress. Pulmonary.  Lungs clear bilaterally, normal respiratory effort. CV.  Regular rate and rhythm, no JVD, rub or murmur. Abdomen.  Soft, nontender, nondistended, BS positive. CNS.  Alert and oriented x3.  No focal neurologic deficit. Extremities.  No edema,  no cyanosis, pulses intact and symmetrical. Psychiatry.  Judgment and insight appears normal.  DVT prophylaxis: Lovenox Code Status: Full Family Communication: Discussed with patient. Disposition Plan:  Status is: Inpatient  Remains inpatient appropriate because:Inpatient level of care appropriate due to severity of illness   Dispo: The patient is from: Home              Anticipated d/c is  to: Home              Anticipated d/c date is: 2 days.              Patient currently is not medically stable to d/c.  Consultants:   None  Procedures:  Antimicrobials:  Zithromax  Data Reviewed: I have personally reviewed following labs and imaging studies  CBC: Recent Labs  Lab 04/03/20 2031 04/05/20 0433  WBC 5.2 3.8*  NEUTROABS  --  2.7  HGB 9.2* 9.7*  HCT 26.8* 27.8*  MCV 85.6 82.7  PLT 142* 122   Basic Metabolic Panel: Recent Labs  Lab 04/03/20 2031 04/04/20 0341 04/05/20 0433  NA 139 138 139  K 4.3 3.9 4.2  CL 107 107 109  CO2 23 20* 22  GLUCOSE 118* 160* 195*  BUN 39* 38* 47*  CREATININE 4.08* 3.69* 3.33*  CALCIUM 7.7* 7.4* 7.6*  MG  --   --  2.3  PHOS  --   --  4.5   GFR: Estimated Creatinine Clearance: 38.3 mL/min (A) (by C-G formula based on SCr of 3.33 mg/dL (H)). Liver Function Tests: Recent Labs  Lab 04/04/20 0341 04/05/20 0433  AST 36 32  ALT 17 17  ALKPHOS 46 51  BILITOT 0.8 0.5  PROT 5.8* 6.0*  ALBUMIN 2.5* 2.5*   No results for input(s): LIPASE, AMYLASE in the last 168 hours. No results for input(s): AMMONIA in the last 168 hours. Coagulation Profile: No results for input(s): INR, PROTIME in the last 168 hours. Cardiac Enzymes: No results for input(s): CKTOTAL, CKMB, CKMBINDEX, TROPONINI in the last 168 hours. BNP (last 3 results) No results for input(s): PROBNP in the last 8760 hours. HbA1C: Recent Labs    04/04/20 0341  HGBA1C 6.7*   CBG: Recent Labs  Lab 04/04/20 1221 04/04/20 1553 04/04/20 1937 04/05/20 0802 04/05/20 1114  GLUCAP 242* 243* 218* 184* 203*   Lipid Profile: Recent Labs    04/04/20 0341  TRIG 93   Thyroid Function Tests: No results for input(s): TSH, T4TOTAL, FREET4, T3FREE, THYROIDAB in the last 72 hours. Anemia Panel: Recent Labs    04/04/20 0341 04/05/20 0433  FERRITIN 534* 632*   Sepsis Labs: Recent Labs  Lab 04/04/20 0005 04/04/20 0341 04/05/20 0433  PROCALCITON  --  0.17   0.19 0.17  LATICACIDVEN 0.6 0.6  --     Recent Results (from the past 240 hour(s))  SARS CORONAVIRUS 2 (TAT 6-24 HRS) Nasopharyngeal Nasopharyngeal Swab     Status: None   Collection Time: 03/28/20 11:31 AM   Specimen: Nasopharyngeal Swab  Result Value Ref Range Status   SARS Coronavirus 2 NEGATIVE NEGATIVE Final    Comment: (NOTE) SARS-CoV-2 target nucleic acids are NOT DETECTED.  The SARS-CoV-2 RNA is generally detectable in upper and lower respiratory specimens during the acute phase of infection. Negative results do not preclude SARS-CoV-2 infection, do not rule out co-infections with other pathogens, and should not be used as the sole basis for treatment or other patient management decisions. Negative results must be combined with clinical observations, patient  history, and epidemiological information. The expected result is Negative.  Fact Sheet for Patients: SugarRoll.be  Fact Sheet for Healthcare Providers: https://www.woods-mathews.com/  This test is not yet approved or cleared by the [place] FDA and  has been authorized for detection and/or diagnosis of SARS-CoV-2 by FDA under an Emergency Use Authorization (EUA). This EUA will remain  in effect (meaning this test can be used) for the duration of the COVID-19 declaration under Se ction 564(b)(1) of the Act, 21 U.S.C. section 360bbb-3(b)(1), unless the authorization is terminated or revoked sooner.  Performed at [facility], [place] [address] [place], [place] [address]   Blood Culture (routine x 2)     Status: None (Preliminary result)   Collection Time: 04/04/20 12:00 AM   Specimen: BLOOD  Result Value Ref Range Status   Specimen Description BLOOD LEFT ARM  Final   Special Requests   Final    BOTTLES DRAWN AEROBIC AND ANAEROBIC Blood Culture adequate volume   Culture   Final    NO GROWTH 1 DAY Performed at [facility], [address]  [place], [place] [address]    Report Status PENDING  Incomplete  Blood Culture (routine x 2)     Status: None (Preliminary result)   Collection Time: 04/04/20 12:48 AM   Specimen: BLOOD  Result Value Ref Range Status   Specimen Description BLOOD RIGHT [facility]  Final   Special Requests   Final    BOTTLES DRAWN AEROBIC AND ANAEROBIC Blood Culture adequate volume   Culture   Final    NO GROWTH 1 DAY Performed at [facility], [address] [place], [place] [address]    Report Status PENDING  Incomplete     Radiology Studies: DG Chest 2 View  Result Date: 04/03/2020 CLINICAL DATA:  49 year old male with weakness. EXAM: CHEST - 2 VIEW COMPARISON:  Chest radiograph dated 09/12/2017 FINDINGS: Small left pleural effusion with left lung base densities which may represent atelectasis or infiltrate. The right lung is clear. No pneumothorax. There is mild cardiomegaly. A loop recorder device is noted. No acute osseous pathology. IMPRESSION: Small left pleural effusion with left lung base atelectasis or infiltrate. Electronically Signed   By: Anner Crete M.D.   On: 04/03/2020 20:50    Scheduled Meds: . albuterol  2 puff Inhalation Q6H  . [START ON 04/06/2020] amLODipine  10 mg Oral Daily  . vitamin C  500 mg Oral Daily  . aspirin EC  81 mg Oral Daily  . atorvastatin  80 mg Oral Daily  . dexamethasone (DECADRON) injection  6 mg Intravenous Daily  . DULoxetine  20 mg Oral Daily  . enoxaparin (LOVENOX) injection  40 mg Subcutaneous Q12H  . hydrALAZINE  50 mg Oral Q8H  . insulin aspart  0-20 Units Subcutaneous TID WC  . insulin aspart  0-5 Units Subcutaneous QHS  . insulin aspart  5 Units Subcutaneous TID WC  . insulin glargine  15 Units Subcutaneous QHS  . levothyroxine  125 mcg Oral Q0600  . linagliptin  5 mg Oral Daily  . losartan  100 mg Oral Daily  . multivitamin with minerals  1 tablet Oral Daily  . pregabalin  100 mg Oral TID  . sodium chloride flush  3 mL Intravenous Once  .  zinc sulfate  220 mg Oral Daily   Continuous Infusions: . remdesivir 100 mg in NS 100 mL 100 mg (04/05/20 0923)     LOS: 2 days   Time  spent: 35 minutes.  [name], MD [facility]  If 7PM-7AM, please contact night-coverage [email or web address]  04/05/2020, 4:12 PM   This record has been created using Systems analyst. Errors have been sought and corrected,but may not always be located. Such creation errors do not reflect on the standard of care.

## 2020-04-06 LAB — CBC WITH DIFFERENTIAL/PLATELET
Abs Immature Granulocytes: 0.04 10*3/uL (ref 0.00–0.07)
Basophils Absolute: 0 10*3/uL (ref 0.0–0.1)
Basophils Relative: 0 %
Eosinophils Absolute: 0 10*3/uL (ref 0.0–0.5)
Eosinophils Relative: 0 %
HCT: 28.1 % — ABNORMAL LOW (ref 39.0–52.0)
Hemoglobin: 9.6 g/dL — ABNORMAL LOW (ref 13.0–17.0)
Immature Granulocytes: 1 %
Lymphocytes Relative: 12 %
Lymphs Abs: 0.7 10*3/uL (ref 0.7–4.0)
MCH: 29.4 pg (ref 26.0–34.0)
MCHC: 34.2 g/dL (ref 30.0–36.0)
MCV: 85.9 fL (ref 80.0–100.0)
Monocytes Absolute: 0.4 10*3/uL (ref 0.1–1.0)
Monocytes Relative: 7 %
Neutro Abs: 4.6 10*3/uL (ref 1.7–7.7)
Neutrophils Relative %: 80 %
Platelets: 178 10*3/uL (ref 150–400)
RBC: 3.27 MIL/uL — ABNORMAL LOW (ref 4.22–5.81)
RDW: 12.6 % (ref 11.5–15.5)
WBC: 5.8 10*3/uL (ref 4.0–10.5)
nRBC: 0 % (ref 0.0–0.2)

## 2020-04-06 LAB — COMPREHENSIVE METABOLIC PANEL
ALT: 16 U/L (ref 0–44)
AST: 27 U/L (ref 15–41)
Albumin: 2.4 g/dL — ABNORMAL LOW (ref 3.5–5.0)
Alkaline Phosphatase: 44 U/L (ref 38–126)
Anion gap: 8 (ref 5–15)
BUN: 55 mg/dL — ABNORMAL HIGH (ref 6–20)
CO2: 22 mmol/L (ref 22–32)
Calcium: 7.5 mg/dL — ABNORMAL LOW (ref 8.9–10.3)
Chloride: 109 mmol/L (ref 98–111)
Creatinine, Ser: 3.04 mg/dL — ABNORMAL HIGH (ref 0.61–1.24)
GFR calc Af Amer: 27 mL/min — ABNORMAL LOW (ref >60)
GFR calc non Af Amer: 23 mL/min — ABNORMAL LOW (ref >60)
Glucose, Bld: 118 mg/dL — ABNORMAL HIGH (ref 70–99)
Potassium: 4.1 mmol/L (ref 3.5–5.1)
Sodium: 139 mmol/L (ref 135–145)
Total Bilirubin: 0.6 mg/dL (ref 0.3–1.2)
Total Protein: 5.7 g/dL — ABNORMAL LOW (ref 6.5–8.1)

## 2020-04-06 LAB — FERRITIN: Ferritin: 503 ng/mL — ABNORMAL HIGH (ref 24–336)

## 2020-04-06 LAB — PROCALCITONIN: Procalcitonin: 0.16 ng/mL

## 2020-04-06 LAB — MAGNESIUM: Magnesium: 2.3 mg/dL (ref 1.7–2.4)

## 2020-04-06 LAB — PHOSPHORUS: Phosphorus: 4.3 mg/dL (ref 2.5–4.6)

## 2020-04-06 LAB — C-REACTIVE PROTEIN: CRP: 2.5 mg/dL — ABNORMAL HIGH (ref <1.0)

## 2020-04-06 LAB — GLUCOSE, CAPILLARY
Glucose-Capillary: 137 mg/dL — ABNORMAL HIGH (ref 70–99)
Glucose-Capillary: 155 mg/dL — ABNORMAL HIGH (ref 70–99)
Glucose-Capillary: 180 mg/dL — ABNORMAL HIGH (ref 70–99)
Glucose-Capillary: 220 mg/dL — ABNORMAL HIGH (ref 70–99)

## 2020-04-06 LAB — FIBRIN DERIVATIVES D-DIMER (ARMC ONLY): Fibrin derivatives D-dimer (ARMC): 723.3 ng/mL (FEU) — ABNORMAL HIGH (ref 0.00–499.00)

## 2020-04-06 NOTE — Progress Notes (Signed)
Gregory Crane attempted to call pt. per OR for prayer support; pt. did not answer.  [place] may attempt follow-up visit this evening.

## 2020-04-06 NOTE — TOC Initial Note (Signed)
Transition of Care [facility]) - Initial/Assessment Note    Patient Details  Name: Gregory Crane MRN: [id number] Date of Birth: 18-Dec-1970  Transition of [facility]) CM/SW Contact:    Shelbie Hutching, RN Phone Number: 04/06/2020, 10:14 AM  Clinical Narrative:                 RNCM attempted to contact patient via phone yesterday and this morning with no answer.  Patient is on airborne precautions for COVID 19, patient's wife also has COVID and is in the ICU.  Per patient chart patient is current with his PCP.  RNCM will cont to follow for needs.   Expected Discharge Plan: Home/Self Care Barriers to Discharge: Continued Medical Work up   Patient Goals and CMS Choice        Expected Discharge Plan and Services Expected Discharge Plan: Home/Self Care   Discharge Planning Services: CM Consult   Living arrangements for the past 2 months: Single Family Home                                      Prior Living Arrangements/Services Living arrangements for the past 2 months: Single Family Home Lives with:: Spouse Patient language and need for interpreter reviewed:: Yes Do you feel safe going back to the place where you live?: Yes      Need for Family Participation in Patient Care: Yes (Comment) (COVID) Care giver support system in place?: Yes (comment) (wife)   Criminal Activity/Legal Involvement Pertinent to Current Situation/Hospitalization: No - Comment as needed  Activities of Daily Living Home Assistive Devices/Equipment: Eyeglasses ADL Screening (condition at time of admission) Patient's cognitive ability adequate to safely complete daily activities?: Yes Is the patient deaf or have difficulty hearing?: No Does the patient have difficulty seeing, even when wearing glasses/contacts?: No Does the patient have difficulty concentrating, remembering, or making decisions?: No Patient able to express need for assistance with ADLs?: Yes Does the patient have difficulty dressing  or bathing?: Yes Independently performs ADLs?: Yes (appropriate for developmental age) Does the patient have difficulty walking or climbing stairs?: No Weakness of Legs: None Weakness of Arms/Hands: None  Permission Sought/Granted Permission sought to share information with : Case Manager Permission granted to share information with : Yes, Verbal Permission Granted              Emotional Assessment       Orientation: : Oriented to Self, Oriented to Place, Oriented to  Time, Oriented to Situation Alcohol / Substance Use: Not Applicable Psych Involvement: No (comment)  Admission diagnosis:  Pneumonia due to COVID-19 virus [U07.1, J12.82] COVID-19 [U07.1] Patient Active Problem List   Diagnosis Date Noted  . Obesity, Class III, BMI 40-49.9 (morbid obesity) ([place]) 04/04/2020  . CKD (chronic kidney disease) stage 4, GFR 15-29 ml/min (HCC) 04/04/2020  . Pneumonia due to COVID-19 virus 04/03/2020  . AKI (acute kidney injury) ([place]) 04/03/2020  . Elevated troponin 04/03/2020  . Acquired trigger finger 06/08/2019  . Lymphedema 06/08/2019  . Anemia 04/17/2019  . Swelling of left lower extremity 01/10/2019  . Facial droop 04/09/2018  . Daytime somnolence 03/30/2018  . Carotid stenosis 02/03/2018  . Intracranial vascular stenosis 09/12/2017  . Cough 01/20/2017  . Bell's palsy 11/10/2016  . History of CVA (cerebrovascular accident) 11/10/2016  . 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 History of nephrolithiasis 10/27/2016  . TIA (  transient ischemic attack) 10/20/2016  . Near syncope 06/23/2016  . 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 Neuropathy 08/06/2015  . Health care maintenance 08/06/2015  . Essential hypertension 08/06/2015  . Heme positive stool 10/10/2013  . Hypothyroidism 10/10/2013  . Microalbuminuria 10/10/2013  . Hyperlipidemia 10/10/2013  . B12 deficiency 10/10/2013  . Diabetes ([place]) 07/04/2013  . Environmental allergies  07/04/2013   PCP:  [name], MD Pharmacy:   [facility] [phone] [address], [place] [address] Phone: [phone] Fax: [phone]     Social Determinants of Health (SDOH) Interventions    Readmission Risk Interventions No flowsheet data found.

## 2020-04-06 NOTE — Progress Notes (Signed)
PROGRESS NOTE    Gregory Crane [facility]  MOQ:[id number] DOB: 1971/06/26 DOA: 04/03/2020 PCP: [name], MD   Brief Narrative: Taken from H&P Gregory Crane is a 49 y.o. male with medical history significant for  DM, HTN, history of CVA, CKD 4, morbid obesity, with Covid exposure in the home and unvaccinated against Covid who presents with cough, shortness of breath and weakness worsening over the past few days associated with loss of taste.  Patient had a recent colonoscopy on 03/30/2020 and tested negative for Covid prior to his procedure on 03/28/2020 but after he developed symptoms he retested and tested positive. On arrival he was hypoxic requiring 2 to 3 L, chest x-ray with left lung basilar density possible infiltrate, AKI with CKD, elevated troponin. started on remdesivir and steroid.  Subjective: Patient was feeling better when seen today.  He was on room air.  Assessment & Plan:   Principal Problem:   Pneumonia due to COVID-19 virus Active Problems:   Diabetes ([place])   Hypothyroidism   History of CVA (cerebrovascular accident)   AKI (acute kidney injury) ([place])   Elevated troponin   Obesity, Class III, BMI 40-49.9 (morbid obesity) (HCC)   CKD (chronic kidney disease) stage 4, GFR 15-29 ml/min (HCC)  Acute hypoxic respiratory failure secondary to COVID-19 pneumonia. Elevated inflammatory markers.  Initial procalcitonin at 0.19>>0.17 He was saturating well on room air today. -Continue remdesivir and Decadron-day 4 -Continue to monitor inflammatory markers. -Azithromycin for 3 days. -Continue supportive care.  Elevated troponin.  Most likely secondary to demand.  Peaked at 150, not trending down.  No chest pain or EKG changes.  AKI with CKD stage IV.  Elevated creatinine up to 4.08 on arrival, started trending down slowly, 3.04 today.  With baseline around 2.4-2.5. -Encourage p.o. hydration. -Continue to monitor. -Avoid nephrotoxins.  Hypertension.  Blood pressure elevated.  -Continue increased dose of amlodipine from 5-10. -Continue home losartan and hydralazine.  Diabetes mellitus.  CBG elevated -Check A1c- 6.7 -Continue home Lantus 15 units at bedtime. -Continue 5 units of mealtime coverage. -Continue SSI.  Hypothyroidism. -Continue levothyroxine    History of CVA (cerebrovascular accident) -Continue aspirin, plavix and statins    Obesity, Class III, BMI 40-49.9 (morbid obesity) ([place]) -This complicates overall prognosis and care  Objective: Vitals:   04/06/20 1011 04/06/20 1217 04/06/20 1300 04/06/20 1644  BP:  (!) 169/81  (!) 151/89  Pulse:  73  77  Resp:  17  19  Temp:  97.6 F (36.4 C)  98 F (36.7 C)  TempSrc:  Oral  Oral  SpO2: 95% 94% 97% 94%  Weight:      Height:        Intake/Output Summary (Last 24 hours) at 04/06/2020 1652 Last data filed at 04/06/2020 1009 Gross per 24 hour  Intake 360 ml  Output 1550 ml  Net -1190 ml   Filed Weights   04/03/20 2029 04/04/20 0308  Weight: 136.1 kg (!) 136.1 kg    Examination:  General.  Obese gentleman, in no acute distress. Pulmonary.  Lungs clear bilaterally, normal respiratory effort. CV.  Regular rate and rhythm, no JVD, rub or murmur. Abdomen.  Soft, nontender, nondistended, BS positive. CNS.  Alert and oriented x3.  No focal neurologic deficit. Extremities.  No edema, no cyanosis, pulses intact and symmetrical. Psychiatry.  Judgment and insight appears normal.  DVT prophylaxis: Lovenox Code Status: Full Family Communication: Discussed with patient. Disposition Plan:  Status is: Inpatient  Remains inpatient appropriate because:Inpatient level  of care appropriate due to severity of illness   Dispo: The patient is from: Home              Anticipated d/c is to: Home              Anticipated d/c date is: 1 days.              Patient currently is not medically stable to d/c.  Consultants:   None  Procedures:  Antimicrobials:  Zithromax  Data Reviewed: I have  personally reviewed following labs and imaging studies  CBC: Recent Labs  Lab 04/03/20 2031 04/05/20 0433 04/06/20 0335  WBC 5.2 3.8* 5.8  NEUTROABS  --  2.7 4.6  HGB 9.2* 9.7* 9.6*  HCT 26.8* 27.8* 28.1*  MCV 85.6 82.7 85.9  PLT 142* 154 756   Basic Metabolic Panel: Recent Labs  Lab 04/03/20 2031 04/04/20 0341 04/05/20 0433 04/06/20 0335  NA 139 138 139 139  K 4.3 3.9 4.2 4.1  CL 107 107 109 109  CO2 23 20* 22 22  GLUCOSE 118* 160* 195* 118*  BUN 39* 38* 47* 55*  CREATININE 4.08* 3.69* 3.33* 3.04*  CALCIUM 7.7* 7.4* 7.6* 7.5*  MG  --   --  2.3 2.3  PHOS  --   --  4.5 4.3   GFR: Estimated Creatinine Clearance: 42 mL/min (A) (by C-G formula based on SCr of 3.04 mg/dL (H)). Liver Function Tests: Recent Labs  Lab 04/04/20 0341 04/05/20 0433 04/06/20 0335  AST 36 32 27  ALT 17 17 16   ALKPHOS 46 51 44  BILITOT 0.8 0.5 0.6  PROT 5.8* 6.0* 5.7*  ALBUMIN 2.5* 2.5* 2.4*   No results for input(s): LIPASE, AMYLASE in the last 168 hours. No results for input(s): AMMONIA in the last 168 hours. Coagulation Profile: No results for input(s): INR, PROTIME in the last 168 hours. Cardiac Enzymes: No results for input(s): CKTOTAL, CKMB, CKMBINDEX, TROPONINI in the last 168 hours. BNP (last 3 results) No results for input(s): PROBNP in the last 8760 hours. HbA1C: Recent Labs    04/04/20 0341  HGBA1C 6.7*   CBG: Recent Labs  Lab 04/05/20 1643 04/05/20 2137 04/06/20 0906 04/06/20 1216 04/06/20 1643  GLUCAP 211* 187* 137* 155* 180*   Lipid Profile: Recent Labs    04/04/20 0341  TRIG 93   Thyroid Function Tests: No results for input(s): TSH, T4TOTAL, FREET4, T3FREE, THYROIDAB in the last 72 hours. Anemia Panel: Recent Labs    04/05/20 0433 04/06/20 0335  FERRITIN 632* 503*   Sepsis Labs: Recent Labs  Lab 04/04/20 0005 04/04/20 0341 04/05/20 0433 04/06/20 0335  PROCALCITON  --  0.17  0.19 0.17 0.16  LATICACIDVEN 0.6 0.6  --   --     Recent  Results (from the past 240 hour(s))  SARS CORONAVIRUS 2 (TAT 6-24 HRS) Nasopharyngeal Nasopharyngeal Swab     Status: None   Collection Time: 03/28/20 11:31 AM   Specimen: Nasopharyngeal Swab  Result Value Ref Range Status   SARS Coronavirus 2 NEGATIVE NEGATIVE Final    Comment: (NOTE) SARS-CoV-2 target nucleic acids are NOT DETECTED.  The SARS-CoV-2 RNA is generally detectable in upper and lower respiratory specimens during the acute phase of infection. Negative results do not preclude SARS-CoV-2 infection, do not rule out co-infections with other pathogens, and should not be used as the sole basis for treatment or other patient management decisions. Negative results must be combined with clinical observations, patient history, and epidemiological  information. The expected result is Negative.  Fact Sheet for Patients: SugarRoll.be  Fact Sheet for Healthcare Providers: https://www.woods-mathews.com/  This test is not yet approved or cleared by the [place] FDA and  has been authorized for detection and/or diagnosis of SARS-CoV-2 by FDA under an Emergency Use Authorization (EUA). This EUA will remain  in effect (meaning this test can be used) for the duration of the COVID-19 declaration under Se ction 564(b)(1) of the Act, 21 U.S.C. section 360bbb-3(b)(1), unless the authorization is terminated or revoked sooner.  Performed at [facility], [place] [address] [place], [place] [address]   Blood Culture (routine x 2)     Status: None (Preliminary result)   Collection Time: 04/04/20 12:00 AM   Specimen: BLOOD  Result Value Ref Range Status   Specimen Description BLOOD LEFT ARM  Final   Special Requests   Final    BOTTLES DRAWN AEROBIC AND ANAEROBIC Blood Culture adequate volume   Culture   Final    NO GROWTH 2 DAYS Performed at [facility], [address], [place], [place] [address]    Report Status PENDING   Incomplete  Blood Culture (routine x 2)     Status: None (Preliminary result)   Collection Time: 04/04/20 12:48 AM   Specimen: BLOOD  Result Value Ref Range Status   Specimen Description BLOOD RIGHT [facility]  Final   Special Requests   Final    BOTTLES DRAWN AEROBIC AND ANAEROBIC Blood Culture adequate volume   Culture   Final    NO GROWTH 2 DAYS Performed at [facility], [address], [place], [place] [address]    Report Status PENDING  Incomplete     Radiology Studies: No results found.  Scheduled Meds: . albuterol  2 puff Inhalation Q6H  . amLODipine  10 mg Oral Daily  . vitamin C  500 mg Oral Daily  . aspirin EC  81 mg Oral Daily  . atorvastatin  80 mg Oral Daily  . dexamethasone (DECADRON) injection  6 mg Intravenous Daily  . DULoxetine  20 mg Oral Daily  . enoxaparin (LOVENOX) injection  40 mg Subcutaneous Q12H  . hydrALAZINE  50 mg Oral Q8H  . insulin aspart  0-20 Units Subcutaneous TID WC  . insulin aspart  0-5 Units Subcutaneous QHS  . insulin aspart  5 Units Subcutaneous TID WC  . insulin glargine  15 Units Subcutaneous QHS  . levothyroxine  125 mcg Oral Q0600  . linagliptin  5 mg Oral Daily  . losartan  100 mg Oral Daily  . multivitamin with minerals  1 tablet Oral Daily  . pregabalin  100 mg Oral TID  . sodium chloride flush  3 mL Intravenous Once  . zinc sulfate  220 mg Oral Daily   Continuous Infusions: . azithromycin 500 mg (04/05/20 1800)  . remdesivir 100 mg in NS 100 mL 100 mg (04/06/20 1009)     LOS: 3 days   Time spent: 35 minutes.  [name], MD [facility]  If 7PM-7AM, please contact night-coverage [email or web address]  04/06/2020, 4:52 PM   This record has been created using Systems analyst. Errors have been sought and corrected,but may not always be located. Such creation errors do not reflect on the standard of care.

## 2020-04-07 LAB — CBC WITH DIFFERENTIAL/PLATELET
Abs Immature Granulocytes: 0.06 10*3/uL (ref 0.00–0.07)
Basophils Absolute: 0 10*3/uL (ref 0.0–0.1)
Basophils Relative: 0 %
Eosinophils Absolute: 0 10*3/uL (ref 0.0–0.5)
Eosinophils Relative: 0 %
HCT: 29 % — ABNORMAL LOW (ref 39.0–52.0)
Hemoglobin: 9.8 g/dL — ABNORMAL LOW (ref 13.0–17.0)
Immature Granulocytes: 1 %
Lymphocytes Relative: 11 %
Lymphs Abs: 0.9 10*3/uL (ref 0.7–4.0)
MCH: 29.1 pg (ref 26.0–34.0)
MCHC: 33.8 g/dL (ref 30.0–36.0)
MCV: 86.1 fL (ref 80.0–100.0)
Monocytes Absolute: 0.7 10*3/uL (ref 0.1–1.0)
Monocytes Relative: 8 %
Neutro Abs: 7 10*3/uL (ref 1.7–7.7)
Neutrophils Relative %: 80 %
Platelets: 206 10*3/uL (ref 150–400)
RBC: 3.37 MIL/uL — ABNORMAL LOW (ref 4.22–5.81)
RDW: 12.7 % (ref 11.5–15.5)
WBC: 8.6 10*3/uL (ref 4.0–10.5)
nRBC: 0 % (ref 0.0–0.2)

## 2020-04-07 LAB — COMPREHENSIVE METABOLIC PANEL
ALT: 15 U/L (ref 0–44)
AST: 24 U/L (ref 15–41)
Albumin: 2.4 g/dL — ABNORMAL LOW (ref 3.5–5.0)
Alkaline Phosphatase: 46 U/L (ref 38–126)
Anion gap: 8 (ref 5–15)
BUN: 58 mg/dL — ABNORMAL HIGH (ref 6–20)
CO2: 23 mmol/L (ref 22–32)
Calcium: 7.6 mg/dL — ABNORMAL LOW (ref 8.9–10.3)
Chloride: 110 mmol/L (ref 98–111)
Creatinine, Ser: 2.93 mg/dL — ABNORMAL HIGH (ref 0.61–1.24)
GFR calc Af Amer: 28 mL/min — ABNORMAL LOW (ref >60)
GFR calc non Af Amer: 24 mL/min — ABNORMAL LOW (ref >60)
Glucose, Bld: 150 mg/dL — ABNORMAL HIGH (ref 70–99)
Potassium: 4.1 mmol/L (ref 3.5–5.1)
Sodium: 141 mmol/L (ref 135–145)
Total Bilirubin: 0.6 mg/dL (ref 0.3–1.2)
Total Protein: 5.5 g/dL — ABNORMAL LOW (ref 6.5–8.1)

## 2020-04-07 LAB — PHOSPHORUS: Phosphorus: 4.1 mg/dL (ref 2.5–4.6)

## 2020-04-07 LAB — GLUCOSE, CAPILLARY: Glucose-Capillary: 124 mg/dL — ABNORMAL HIGH (ref 70–99)

## 2020-04-07 LAB — FERRITIN: Ferritin: 488 ng/mL — ABNORMAL HIGH (ref 24–336)

## 2020-04-07 LAB — FIBRIN DERIVATIVES D-DIMER (ARMC ONLY): Fibrin derivatives D-dimer (ARMC): 530.16 ng/mL (FEU) — ABNORMAL HIGH (ref 0.00–499.00)

## 2020-04-07 LAB — C-REACTIVE PROTEIN: CRP: 1.6 mg/dL — ABNORMAL HIGH (ref <1.0)

## 2020-04-07 LAB — MAGNESIUM: Magnesium: 2.3 mg/dL (ref 1.7–2.4)

## 2020-04-07 MED ORDER — GUAIFENESIN-DM 100-10 MG/5ML PO SYRP: 10.0000 mL | ORAL_SOLUTION | ORAL | 0 refills | Status: DC | PRN

## 2020-04-07 MED ORDER — DEXAMETHASONE 6 MG PO TABS
6.0000 mg | ORAL_TABLET | Freq: Every day | ORAL | 0 refills | Status: AC
Start: 2020-04-07 — End: 2020-04-11

## 2020-04-07 MED ORDER — ADULT MULTIVITAMIN W/MINERALS CH: 1.0000 | ORAL_TABLET | Freq: Every day | ORAL | 0 refills | Status: DC

## 2020-04-07 MED ORDER — ZINC SULFATE 220 (50 ZN) MG PO CAPS: 220.0000 mg | ORAL_CAPSULE | Freq: Every day | ORAL | 0 refills | Status: DC

## 2020-04-07 MED ORDER — AMLODIPINE BESYLATE 10 MG PO TABS: 10.0000 mg | ORAL_TABLET | Freq: Every day | ORAL | 1 refills | Status: DC

## 2020-04-07 MED ORDER — ASCORBIC ACID 500 MG PO TABS: 500.0000 mg | ORAL_TABLET | Freq: Every day | ORAL | 0 refills | Status: DC

## 2020-04-07 NOTE — Progress Notes (Signed)
[place] attemtpted follow-up phone call; no answer.  [place] unable to reach RN by phone either.  Will pass along referral to unit chaplain for potential follow-up.

## 2020-04-07 NOTE — Discharge Summary (Signed)
Physician Discharge Summary  Gregory Crane IRW:[id number] DOB: 11/16/1970 DOA: 04/03/2020  PCP: [name], MD  Admit date: 04/03/2020 Discharge date: 04/07/2020  Admitted From: Home Disposition:  Home  Recommendations for Outpatient Follow-up:  1. Follow up with PCP in 1-2 weeks 2. Follow-up with nephrology 3. Please obtain BMP/CBC in one week 4. Please follow up on the following pending results: None  Home Health: No Equipment/Devices: None  discharge Condition: Stable CODE STATUS: Full Diet recommendation: Heart Healthy / Carb Modified   Brief/Interim Summary: Gregory Crane a 49 y.o.malewith medical history significant for DM, HTN, history of CVA, CKD 4, morbid obesity, with Covid exposure in the home and unvaccinated against Covid who presents with cough, shortness of breath and weakness worsening over the past few days associated with loss of taste. Patient had a recent colonoscopy on 03/30/2020 and tested negative for Covid prior to his procedure on 03/28/2020 but after he developed symptoms he retested and tested positive. On arrival he was hypoxic requiring 2 to 3 L, chest x-ray with left lung basilar density possible infiltrate, AKI with CKD, elevated troponin.  He was treated with remdesivir and Decadron, able to wean off from oxygen in 3 days. Completed a course of remdesivir and discharged home on four more days of Decadron. Inflammatory markers were elevated which started trending down. Patient also received 3 days of azithromycin. He will continue with his supplements for next couple of month.  Patient had AKI with creatinine close to four on arrival, baseline around 2.4-2.6. Improved with IV fluid. It was 2.93 on the day of discharge. Patient was advised to keep himself well-hydrated and follow-up with nephrology.  Patient has well-controlled diabetes with A1c of 6.7. CBG elevated secondary to steroid use. He will continue with his current home regimen and follow-up  with his primary care provider.  Patient with history of CVA, on aspirin and Plavix along with statin. Patient needs to discuss with his primary care provider regarding DAPT as it is normally for 3 weeks after CVA and and then continue only with Plavix.  Patient is morbidly obese which can complicate his other comorbidities. He needs regular counseling regarding weight loss.   Discharge Diagnoses:  Principal Problem:   Pneumonia due to COVID-19 virus Active Problems:   Diabetes ([place])   Hypothyroidism   History of CVA (cerebrovascular accident)   AKI (acute kidney injury) ([place])   Elevated troponin   Obesity, Class III, BMI 40-49.9 (morbid obesity) (H[facility])   CKD (chronic kidney disease) stage 4, GFR 15-29 ml/min [facility])   Discharge Instructions  Discharge Instructions    Diet - low sodium heart healthy   Complete by: As directed    Discharge instructions   Complete by: As directed    It was pleasure taking care of you. Please continue taking steroid for four more days. Continue checking your blood glucose level regularly and adjust your insulin accordingly as steroid can increase your blood glucose levels. You will continue with your supplements for couple of month. Please follow-up with nephrology for your worsening renal function, they were close to your baseline on discharge. Keep yourself well-hydrated and follow-up with your primary care provider.   Increase activity slowly   Complete by: As directed      Allergies as of 04/07/2020   No Known Allergies     Medication List    TAKE these medications   amLODipine 10 MG tablet Commonly known as: NORVASC Take 1 tablet (10 mg total) by mouth  daily. What changed:   medication strength  how much to take   ascorbic acid 500 MG tablet Commonly known as: VITAMIN C Take 1 tablet (500 mg total) by mouth daily.   aspirin 81 MG EC tablet Commonly known as: Aspirin Low Dose TAKE 1 TABLET(81 MG) BY MOUTH DAILY   atorvastatin  80 MG tablet Commonly known as: LIPITOR TAKE 1 TABLET(80 MG) BY MOUTH DAILY   clopidogrel 75 MG tablet Commonly known as: PLAVIX TAKE 1 TABLET(75 MG) BY MOUTH DAILY   cyanocobalamin 1000 MCG/ML injection Commonly known as: (VITAMIN B-12) Inject 1 mL into the skin once a week for 3 weeks then once every 30 days.   dexamethasone 6 MG tablet Commonly known as: DECADRON Take 1 tablet (6 mg total) by mouth daily for 4 days.   DULoxetine 20 MG capsule Commonly known as: CYMBALTA Take 20 mg by mouth daily.   glucose blood test strip Commonly known as: FREESTYLE LITE Check blood sugars twice a day (Dx. 250.02)   guaiFENesin-dextromethorphan 100-10 MG/5ML syrup Commonly known as: ROBITUSSIN DM Take 10 mLs by mouth every 4 (four) hours as needed for cough.   HumaLOG 100 UNIT/ML injection Generic drug: insulin lispro INJECT 4 TO 6 UNITS UNDER THE SKIN THREE TIMES DAILY   hydrALAZINE 50 MG tablet Commonly known as: APRESOLINE TAKE 1 TABLET(50 MG) BY MOUTH THREE TIMES DAILY   insulin glargine 100 UNIT/ML injection Commonly known as: Lantus ADMINISTER 15 UNITS UNDER THE SKIN EVERY NIGHT AT BEDTIME   INSULIN SYRINGE .5CC/30GX5/16" 30G X 5/16" 0.5 ML Misc USE AS DIRECTED WITH INSULIN   levothyroxine 125 MCG tablet Commonly known as: SYNTHROID Take 1 tablet (125 mcg total) by mouth daily before breakfast.   losartan 100 MG tablet Commonly known as: COZAAR TAKE 1 TABLET BY MOUTH DAILY   multivitamin with minerals Tabs tablet Take 1 tablet by mouth daily.   OneTouch Delica Lancets Fine Misc 1 applicator by Other route 4 (four) times daily.   Ozempic (1 MG/DOSE) 4 MG/3ML Sopn Generic drug: Semaglutide (1 MG/DOSE) INJECT 1MG  INTO THE SKIN ONCE A WEEK   pregabalin 100 MG capsule Commonly known as: LYRICA TAKE 1 CAPSULE(100 MG) BY MOUTH THREE TIMES DAILY   SYRINGE 3CC/25GX1" 25G X 1" 3 ML Misc Use as directed to administer b12 injections.   zinc sulfate 220 (50 Zn) MG  capsule Take 1 capsule (220 mg total) by mouth daily.       Follow-up Information    [name], MD. Schedule an appointment as soon as possible for a visit.   Specialty: Internal Medicine Contact information: [address] [place] [address] [phone]              No Known Allergies  Consultations:  None  Procedures/Studies: DG Chest 2 View  Result Date: 04/03/2020 CLINICAL DATA:  49 year old male with weakness. EXAM: CHEST - 2 VIEW COMPARISON:  Chest radiograph dated 09/12/2017 FINDINGS: Small left pleural effusion with left lung base densities which may represent atelectasis or infiltrate. The right lung is clear. No pneumothorax. There is mild cardiomegaly. A loop recorder device is noted. No acute osseous pathology. IMPRESSION: Small left pleural effusion with left lung base atelectasis or infiltrate. Electronically Signed   By: Anner Crete M.D.   On: 04/03/2020 20:50   CUP PACEART REMOTE DEVICE CHECK  Result Date: 03/28/2020 Carelink summary report received. Battery status OK. Normal device function. No new symptom episodes, tachy episodes, brady, or pause episodes. No new  AF episodes. Monthly summary reports and ROV/PRN   Subjective: Patient has no new complaints today. Ready to go home.  Discharge Exam: Vitals:   04/06/20 2346 04/07/20 0803  BP: (!) 156/75 (!) 170/79  Pulse: 74 74  Resp: 17 16  Temp: 98.2 F (36.8 C) 98.1 F (36.7 C)  SpO2: 94% 95%   Vitals:   04/06/20 1644 04/06/20 2000 04/06/20 2346 04/07/20 0803  BP: (!) 151/89 (!) 157/89 (!) 156/75 (!) 170/79  Pulse: 77 78 74 74  Resp: 19 17 17 16   Temp: 98 F (36.7 C) 97.9 F (36.6 C) 98.2 F (36.8 C) 98.1 F (36.7 C)  TempSrc: Oral Oral  Oral  SpO2: 94% 95% 94% 95%  Weight:      Height:        General: Pt is alert, awake, not in acute distress Cardiovascular: RRR, S1/S2 +, no rubs, no gallops Respiratory: CTA bilaterally, no wheezing, no  rhonchi Abdominal: Soft, NT, ND, bowel sounds + Extremities: no edema, no cyanosis   The results of significant diagnostics from this hospitalization (including imaging, microbiology, ancillary and laboratory) are listed below for reference.    Microbiology: Recent Results (from the past 240 hour(s))  SARS CORONAVIRUS 2 (TAT 6-24 HRS) Nasopharyngeal Nasopharyngeal Swab     Status: None   Collection Time: 03/28/20 11:31 AM   Specimen: Nasopharyngeal Swab  Result Value Ref Range Status   SARS Coronavirus 2 NEGATIVE NEGATIVE Final    Comment: (NOTE) SARS-CoV-2 target nucleic acids are NOT DETECTED.  The SARS-CoV-2 RNA is generally detectable in upper and lower respiratory specimens during the acute phase of infection. Negative results do not preclude SARS-CoV-2 infection, do not rule out co-infections with other pathogens, and should not be used as the sole basis for treatment or other patient management decisions. Negative results must be combined with clinical observations, patient history, and epidemiological information. The expected result is Negative.  Fact Sheet for Patients: SugarRoll.be  Fact Sheet for Healthcare Providers: https://www.woods-mathews.com/  This test is not yet approved or cleared by the [place] FDA and  has been authorized for detection and/or diagnosis of SARS-CoV-2 by FDA under an Emergency Use Authorization (EUA). This EUA will remain  in effect (meaning this test can be used) for the duration of the COVID-19 declaration under Se ction 564(b)(1) of the Act, 21 U.S.C. section 360bbb-3(b)(1), unless the authorization is terminated or revoked sooner.  Performed at [facility], [place] [address], [place], [place] [address]   Blood Culture (routine x 2)     Status: None (Preliminary result)   Collection Time: 04/04/20 12:00 AM   Specimen: BLOOD  Result Value Ref Range Status   Specimen Description  BLOOD LEFT ARM  Final   Special Requests   Final    BOTTLES DRAWN AEROBIC AND ANAEROBIC Blood Culture adequate volume   Culture   Final    NO GROWTH 3 DAYS Performed at [facility], [address], [place], [place] [address]    Report Status PENDING  Incomplete  Blood Culture (routine x 2)     Status: None (Preliminary result)   Collection Time: 04/04/20 12:48 AM   Specimen: BLOOD  Result Value Ref Range Status   Specimen Description BLOOD RIGHT [facility]  Final   Special Requests   Final    BOTTLES DRAWN AEROBIC AND ANAEROBIC Blood Culture adequate volume   Culture   Final    NO GROWTH 3 DAYS Performed at [facility], [address] [place], [place] [address]    Report Status PENDING  Incomplete     Labs: BNP (last 3 results) No results for input(s): BNP in the last 8760 hours. Basic Metabolic Panel: Recent Labs  Lab 04/03/20 2031 04/04/20 0341 04/05/20 0433 04/06/20 0335 04/07/20 0457  NA 139 138 139 139 141  K 4.3 3.9 4.2 4.1 4.1  CL 107 107 109 109 110  CO2 23 20* 22 22 23   GLUCOSE 118* 160* 195* 118* 150*  BUN 39* 38* 47* 55* 58*  CREATININE 4.08* 3.69* 3.33* 3.04* 2.93*  CALCIUM 7.7* 7.4* 7.6* 7.5* 7.6*  MG  --   --  2.3 2.3 2.3  PHOS  --   --  4.5 4.3 4.1   Liver Function Tests: Recent Labs  Lab 04/04/20 0341 04/05/20 0433 04/06/20 0335 04/07/20 0457  AST 36 32 27 24  ALT 17 17 16 15   ALKPHOS 46 51 44 46  BILITOT 0.8 0.5 0.6 0.6  PROT 5.8* 6.0* 5.7* 5.5*  ALBUMIN 2.5* 2.5* 2.4* 2.4*   No results for input(s): LIPASE, AMYLASE in the last 168 hours. No results for input(s): AMMONIA in the last 168 hours. CBC: Recent Labs  Lab 04/03/20 2031 04/05/20 0433 04/06/20 0335 04/07/20 0457  WBC 5.2 3.8* 5.8 8.6  NEUTROABS  --  2.7 4.6 7.0  HGB 9.2* 9.7* 9.6* 9.8*  HCT 26.8* 27.8* 28.1* 29.0*  MCV 85.6 82.7 85.9 86.1  PLT 142* 154 178 206   Cardiac Enzymes: No results for input(s): CKTOTAL, CKMB, CKMBINDEX, TROPONINI in the last  168 hours. BNP: Invalid input(s): POCBNP CBG: Recent Labs  Lab 04/06/20 0906 04/06/20 1216 04/06/20 1643 04/06/20 2002 04/07/20 0845  GLUCAP 137* 155* 180* 220* 124*   D-Dimer No results for input(s): DDIMER in the last 72 hours. Hgb A1c No results for input(s): HGBA1C in the last 72 hours. Lipid Profile No results for input(s): CHOL, HDL, LDLCALC, TRIG, CHOLHDL, LDLDIRECT in the last 72 hours. Thyroid function studies No results for input(s): TSH, T4TOTAL, T3FREE, THYROIDAB in the last 72 hours.  Invalid input(s): FREET3 Anemia work up Recent Labs    04/06/20 0335 04/07/20 0457  FERRITIN 503* 488*   Urinalysis    Component Value Date/Time   COLORURINE YELLOW (A) 09/12/2017 1722   APPEARANCEUR HAZY (A) 09/12/2017 1722   APPEARANCEUR Clear 01/27/2014 0247   LABSPEC 1.020 09/12/2017 1722   LABSPEC 1.033 01/27/2014 0247   PHURINE 5.0 09/12/2017 1722   GLUCOSEU >=500 (A) 09/12/2017 1722   GLUCOSEU >=500 01/27/2014 0247   HGBUR SMALL (A) 09/12/2017 1722   BILIRUBINUR NEGATIVE 09/12/2017 1722   BILIRUBINUR Negative 01/27/2014 0247   KETONESUR 20 (A) 09/12/2017 1722   PROTEINUR 100 (A) 09/12/2017 1722   NITRITE NEGATIVE 09/12/2017 1722   LEUKOCYTESUR NEGATIVE 09/12/2017 1722   LEUKOCYTESUR Negative 01/27/2014 0247   Sepsis Labs Invalid input(s): PROCALCITONIN,  WBC,  LACTICIDVEN Microbiology Recent Results (from the past 240 hour(s))  SARS CORONAVIRUS 2 (TAT 6-24 HRS) Nasopharyngeal Nasopharyngeal Swab     Status: None   Collection Time: 03/28/20 11:31 AM   Specimen: Nasopharyngeal Swab  Result Value Ref Range Status   SARS Coronavirus 2 NEGATIVE NEGATIVE Final    Comment: (NOTE) SARS-CoV-2 target nucleic acids are NOT DETECTED.  The SARS-CoV-2 RNA is generally detectable in upper and lower respiratory specimens during the acute phase of infection. Negative results do not preclude SARS-CoV-2 infection, do not rule out co-infections with other pathogens, and  should not be  used as the sole basis for treatment or other patient management decisions. Negative results must be combined with clinical observations, patient history, and epidemiological information. The expected result is Negative.  Fact Sheet for Patients: SugarRoll.be  Fact Sheet for Healthcare Providers: https://www.woods-mathews.com/  This test is not yet approved or cleared by the [place] FDA and  has been authorized for detection and/or diagnosis of SARS-CoV-2 by FDA under an Emergency Use Authorization (EUA). This EUA will remain  in effect (meaning this test can be used) for the duration of the COVID-19 declaration under Se ction 564(b)(1) of the Act, 21 U.S.C. section 360bbb-3(b)(1), unless the authorization is terminated or revoked sooner.  Performed at [facility], [place] [address] [place], [place] [address]   Blood Culture (routine x 2)     Status: None (Preliminary result)   Collection Time: 04/04/20 12:00 AM   Specimen: BLOOD  Result Value Ref Range Status   Specimen Description BLOOD LEFT ARM  Final   Special Requests   Final    BOTTLES DRAWN AEROBIC AND ANAEROBIC Blood Culture adequate volume   Culture   Final    NO GROWTH 3 DAYS Performed at [facility], [address], [place], [place] [address]    Report Status PENDING  Incomplete  Blood Culture (routine x 2)     Status: None (Preliminary result)   Collection Time: 04/04/20 12:48 AM   Specimen: BLOOD  Result Value Ref Range Status   Specimen Description BLOOD RIGHT [facility]  Final   Special Requests   Final    BOTTLES DRAWN AEROBIC AND ANAEROBIC Blood Culture adequate volume   Culture   Final    NO GROWTH 3 DAYS Performed at [facility], [address], [place], [place] [address]    Report Status PENDING  Incomplete    Time coordinating discharge: Over 30 minutes  SIGNED:  [name], MD  [facility] 04/07/2020,  10:51 AM  If 7PM-7AM, please contact night-coverage [email or web address]  This record has been created using Systems analyst. Errors have been sought and corrected,but may not always be located. Such creation errors do not reflect on the standard of care.

## 2020-04-07 NOTE — Plan of Care (Signed)
  Problem: Education: Goal: Knowledge of [facility] Education information/materials will improve Outcome: Progressing Goal: Emotional status will improve Outcome: Progressing Goal: Mental status will improve Outcome: Progressing Goal: Verbalization of understanding the information provided will improve Outcome: Progressing   Problem: Activity: Goal: Interest or engagement in activities will improve Outcome: Progressing Goal: Sleeping patterns will improve Outcome: Progressing   Problem: Coping: Goal: Ability to verbalize frustrations and anger appropriately will improve Outcome: Progressing Goal: Ability to demonstrate self-control will improve Outcome: Progressing   Problem: Health Behavior/Discharge Planning: Goal: Identification of resources available to assist in meeting health care needs will improve Outcome: Progressing Goal: Compliance with treatment plan for underlying cause of condition will improve Outcome: Progressing   Problem: Physical Regulation: Goal: Ability to maintain clinical measurements within normal limits will improve Outcome: Progressing   Problem: Safety: Goal: Periods of time without injury will increase Outcome: Progressing   

## 2020-04-07 NOTE — Progress Notes (Signed)
Pt discharged via private vehicle,. Discharge instructions explained and given to pt. No further questions at this time. No s/s of distress at this time. IVs removed.

## 2020-04-09 LAB — CULTURE, BLOOD (ROUTINE X 2)
Culture: NO GROWTH
Culture: NO GROWTH
Special Requests: ADEQUATE
Special Requests: ADEQUATE

## 2020-04-10 ENCOUNTER — Telehealth: Payer: Self-pay

## 2020-04-10 ENCOUNTER — Telehealth: Payer: Self-pay | Admitting: Pharmacist

## 2020-04-10 ENCOUNTER — Telehealth: Payer: Managed Care, Other (non HMO)

## 2020-04-10 DIAGNOSIS — Z794 Long term (current) use of insulin: Secondary | ICD-10-CM

## 2020-04-10 DIAGNOSIS — E875 Hyperkalemia: Secondary | ICD-10-CM

## 2020-04-10 DIAGNOSIS — E11319 Type 2 diabetes mellitus with unspecified diabetic retinopathy without macular edema: Secondary | ICD-10-CM

## 2020-04-10 MED ORDER — OZEMPIC (1 MG/DOSE) 4 MG/3ML ~~LOC~~ SOPN: 1.0000 mg | PEN_INJECTOR | SUBCUTANEOUS | 3 refills | Status: DC

## 2020-04-10 NOTE — Telephone Encounter (Signed)
Transition Care Management Follow-up Telephone Call  Date of discharge and from where: 04/07/20 from [facility]  How have you been since you were released from the hospital? Patient states," My breathing is fine. I have a congested cough with mucus moving around but not quite coming up." No oxygen in use. Denies shortness of breath, dizziness, extreme fatigue and weakness.   Any questions or concerns? No   Items Reviewed:  Did the pt receive and understand the discharge instructions provided? Yes , increase activity as tolerated.   Medications obtained and verified? Yes   Any new allergies since your discharge? No   Dietary orders reviewed? Yes, heart healthy, carb modified, low sodium  Do you have support at home? Yes , stepdaughter can be called and is available if anything is needed  Functional Questionnaire: (I = Independent and D = Dependent) ADLs: i  Follow up appointments reviewed:   PCP Hospital f/u appt confirmed? Yes  Scheduled to see Dr. Nicki Reaper on 04/11/20 @ 11:00 via mychart.  Patient would like to schedule Nephrology follow up after visit with PMD.   Are transportation arrangements needed? No   If their condition worsens, is the pt aware to call PCP or go to the Emergency Dept.? Yes  Was the patient provided with contact information for the PCP's office or ED? Yes  Was to pt encouraged to call back with questions or concerns? Yes

## 2020-04-10 NOTE — Telephone Encounter (Signed)
  Chronic Care Management   Note  04/10/2020 Name: Gregory Crane MRN: [id number] DOB: 01/16/1971   Contacted patient for scheduled appointment for medication management support. Unfortunately, he was recently hospitalized for COVID. Reports today that he does not feel up to discussing medications and readings, especially given the fact that his wife is still admitted and in the ICU.   Reports that he needs refills sent on Ozempic and duloxetine for 90 day supplies. Sent Ozempic, will forward, request for duloxetine refill to PCP.   Plan: - Rescheduled f/u call for next month  Catie [name], PharmD, [place], [place] Pharmacist [place] [phone]

## 2020-04-11 ENCOUNTER — Telehealth (INDEPENDENT_AMBULATORY_CARE_PROVIDER_SITE_OTHER): Payer: Managed Care, Other (non HMO) | Admitting: Internal Medicine

## 2020-04-11 ENCOUNTER — Encounter: Payer: Self-pay | Admitting: Internal Medicine

## 2020-04-11 VITALS — Ht 72.0 in | Wt 320.0 lb

## 2020-04-11 DIAGNOSIS — U071 COVID-19: Secondary | ICD-10-CM | POA: Diagnosis not present

## 2020-04-11 DIAGNOSIS — E785 Hyperlipidemia, unspecified: Secondary | ICD-10-CM

## 2020-04-11 DIAGNOSIS — E11319 Type 2 diabetes mellitus with unspecified diabetic retinopathy without macular edema: Secondary | ICD-10-CM

## 2020-04-11 DIAGNOSIS — J1282 Pneumonia due to coronavirus disease 2019: Secondary | ICD-10-CM

## 2020-04-11 DIAGNOSIS — E039 Hypothyroidism, unspecified: Secondary | ICD-10-CM

## 2020-04-11 DIAGNOSIS — N184 Chronic kidney disease, stage 4 (severe): Secondary | ICD-10-CM

## 2020-04-11 DIAGNOSIS — G629 Polyneuropathy, unspecified: Secondary | ICD-10-CM

## 2020-04-11 DIAGNOSIS — I10 Essential (primary) hypertension: Secondary | ICD-10-CM

## 2020-04-11 DIAGNOSIS — D649 Anemia, unspecified: Secondary | ICD-10-CM

## 2020-04-11 DIAGNOSIS — Z8673 Personal history of transient ischemic attack (TIA), and cerebral infarction without residual deficits: Secondary | ICD-10-CM

## 2020-04-11 DIAGNOSIS — Z794 Long term (current) use of insulin: Secondary | ICD-10-CM

## 2020-04-11 NOTE — Progress Notes (Signed)
Patient ID: Gregory Crane, male   DOB: [birth year], 49 y.o.   MRN: [id number]   Virtual Visit via video Note  This visit type was conducted due to national recommendations for restrictions regarding the COVID-19 pandemic (e.g. social distancing).  This format is felt to be most appropriate for this patient at this time.  All issues noted in this document were discussed and addressed.  No physical exam was performed (except for noted visual exam findings with Video Visits).   I connected with Gregory Crane by a video enabled telemedicine application and verified that I am speaking with the correct person using two identifiers. Location patient: home Location provider: work  Persons participating in the virtual visit: patient, provider  The limitations, risks, security and privacy concerns of performing an evaluation and management service by video and the availability of in person appointments have been discussed.  It has also been discussed with the patient that there may be a patient responsible charge related to this service. The patient expressed understanding and agreed to proceed.   Reason for visit:  Hospital follow up.   HPI: Admitted 04/03/20 - 04/07/20 with cough, sob and weakness.  Also developed loss of taste.  Diagnosed with covid.  On arrival he was hypoxic requiring 2-3 liters of oxygen and cxr with left lung basilar density - possible infiltrate, AKI with CKD.  Treated with remdesivir and decadron.  Able to wean off oxygen in 3 days.  Also treated with azithromycin.  AKI improved with IV hydration.  Discharge creatinine 2.93.  States since being home, he is breathing better.  Still with some cough.  Eating.  No nausea or vomiting.  No chest pain.  No abdominal pain.  Not checking sugars.  Increased stress. Wife in hospital with covid.  Intubated.  Discussed with him.  States he has good support.     ROS: See pertinent positives and negatives per HPI.  Past Medical History:   Diagnosis Date  . Allergy   . Anemia   . Bell's palsy   . Diabetes mellitus without complication (HCC)    diet controlled  . Hypertension   . Hypothyroidism   . Kidney stones   . Pseudotumor cerebri   . Stroke [facility])     Past Surgical History:  Procedure Laterality Date  . COLONOSCOPY WITH PROPOFOL N/A 03/30/2020   Procedure: COLONOSCOPY WITH PROPOFOL;  Surgeon: Lesly Rubenstein, MD;  Location: [facility] ENDOSCOPY;  Service: Endoscopy;  Laterality: N/A;  . LOOP RECORDER INSERTION N/A 01/27/2018   Procedure: LOOP RECORDER INSERTION;  Surgeon: [name], MD;  Location: [facility];  Service: Cardiovascular;  Laterality: N/A;  . LUMBAR PUNCTURE     as child  . NO PAST SURGERIES    . TEE WITHOUT CARDIOVERSION N/A 01/07/2018   Procedure: TRANSESOPHAGEAL ECHOCARDIOGRAM (TEE);  Surgeon: Minna Merritts, MD;  Location: [facility] ORS;  Service: Cardiovascular;  Laterality: N/A;    Family History  Problem Relation Age of Onset  . Breast cancer Mother   . Diabetes Father   . Diabetes Sister   . Arthritis Maternal Grandmother   . Diabetes Maternal Grandmother     SOCIAL HX: reviewed.   Current Outpatient Medications:  .  amLODipine (NORVASC) 10 MG tablet, Take 1 tablet (10 mg total) by mouth daily., Disp: 30 tablet, Rfl: 1 .  ascorbic acid (VITAMIN C) 500 MG tablet, Take 1 tablet (500 mg total) by mouth daily., Disp: 90 tablet, Rfl: 0 .  aspirin (ASPIRIN LOW DOSE) 81 MG EC tablet, TAKE 1 TABLET(81 MG) BY MOUTH DAILY, Disp: 90 tablet, Rfl: 1 .  atorvastatin (LIPITOR) 80 MG tablet, TAKE 1 TABLET(80 MG) BY MOUTH DAILY, Disp: 90 tablet, Rfl: 0 .  clopidogrel (PLAVIX) 75 MG tablet, TAKE 1 TABLET(75 MG) BY MOUTH DAILY, Disp: 90 tablet, Rfl: 2 .  cyanocobalamin (,VITAMIN B-12,) 1000 MCG/ML injection, Inject 1 mL into the skin once a week for 3 weeks then once every 30 days., Disp: 10 mL, Rfl: 0 .  DULoxetine (CYMBALTA) 20 MG capsule, Take 20 mg by mouth daily. , Disp: , Rfl: 3 .   glucose blood (FREESTYLE LITE) test strip, Check blood sugars twice a day (Dx. 250.02), Disp: 200 each, Rfl: 1 .  guaiFENesin-dextromethorphan (ROBITUSSIN DM) 100-10 MG/5ML syrup, Take 10 mLs by mouth every 4 (four) hours as needed for cough., Disp: 118 mL, Rfl: 0 .  HUMALOG 100 UNIT/ML injection, INJECT 4 TO 6 UNITS UNDER THE SKIN THREE TIMES DAILY, Disp: 30 mL, Rfl: 0 .  hydrALAZINE (APRESOLINE) 50 MG tablet, TAKE 1 TABLET(50 MG) BY MOUTH THREE TIMES DAILY, Disp: 90 tablet, Rfl: 2 .  insulin glargine (LANTUS) 100 UNIT/ML injection, ADMINISTER 15 UNITS UNDER THE SKIN EVERY NIGHT AT BEDTIME, Disp: 30 mL, Rfl: 0 .  Insulin Syringe-Needle U-100 (INSULIN SYRINGE .5CC/30GX5/16") 30G X 5/16" 0.5 ML MISC, USE AS DIRECTED WITH INSULIN, Disp: 300 each, Rfl: 5 .  levothyroxine (SYNTHROID) 125 MCG tablet, Take 1 tablet (125 mcg total) by mouth daily before breakfast., Disp: 90 tablet, Rfl: 2 .  losartan (COZAAR) 100 MG tablet, TAKE 1 TABLET BY MOUTH DAILY, Disp: 90 tablet, Rfl: 1 .  Multiple Vitamin (MULTIVITAMIN WITH MINERALS) TABS tablet, Take 1 tablet by mouth daily., Disp: 90 tablet, Rfl: 0 .  ONETOUCH DELICA LANCETS FINE MISC, 1 applicator by Other route 4 (four) times daily., Disp: , Rfl: 0 .  pregabalin (LYRICA) 100 MG capsule, TAKE 1 CAPSULE(100 MG) BY MOUTH THREE TIMES DAILY, Disp: 270 capsule, Rfl: 1 .  Semaglutide, 1 MG/DOSE, (OZEMPIC, 1 MG/DOSE,) 4 MG/3ML SOPN, Inject 0.75 mLs (1 mg total) into the skin once a week., Disp: 9 mL, Rfl: 3 .  Syringe/Needle, Disp, (SYRINGE 3CC/25GX1") 25G X 1" 3 ML MISC, Use as directed to administer b12 injections., Disp: 50 each, Rfl: 1 .  zinc sulfate 220 (50 Zn) MG capsule, Take 1 capsule (220 mg total) by mouth daily., Disp: 90 capsule, Rfl: 0  EXAM:  GENERAL: alert, oriented, appears well and in no acute distress  HEENT: atraumatic, conjunttiva clear, no obvious abnormalities on inspection of external nose and ears  NECK: normal movements of the head and  neck  LUNGS: on inspection no signs of respiratory distress, breathing rate appears normal, no obvious gross SOB, gasping or wheezing  CV: no obvious cyanosis  PSYCH/NEURO: pleasant and cooperative, no obvious depression or anxiety, speech and thought processing grossly intact  ASSESSMENT AND PLAN:  Discussed the following assessment and plan:  Pneumonia due to COVID-19 virus Recent hospitalization requiring oxygen.  Weaned off oxygen.  Treated with remdesivir and decadron.  Breathing is better.  Still with some cough and congestion.  Overall feels improved. Follow.  Robitussin/mucinex for cough, etc. Will need f/u cxr.   Neuropathy (HCC) On lyrica.  Follow.    Hypothyroidism On thyroid replacement.  Follow tsh.   Hyperlipidemia On lipitor.  Low cholesterol diet and exercise.  Follow lipid panel and liver function tests.    History of CVA (cerebrovascular  accident) On plavix.  Follow.  No recurring symptoms.   Essential hypertension Recommend spot check pressures.  Continue amlodipine, losartan and hydralazine. Follow pressures.  Follow metabolic panel.   Diabetes ([place]) Sugars previously have been poorly controlled.  On insulin.  Low carb diet and exercise.  Follow met b and a1c.   CKD (chronic kidney disease) stage 4, GFR 15-29 ml/min (HCC) AKI during hospitalization.  Discharge creatinine improved, but still GFR decreased.  Needs f/u with nephrology.  Avoid antiinflammatories.   Anemia Follow cbc.    Orders Placed This Encounter  Procedures  . CBC with Differential/Platelet  . Basic metabolic panel  . Iron and TIBC  . Ferritin    Standing Status:   Future    Standing Expiration Date:   04/12/2021     I discussed the assessment and treatment plan with the patient. The patient was provided an opportunity to ask questions and all were answered. The patient agreed with the plan and demonstrated an understanding of the instructions.   The patient was advised to call  back or seek an in-person evaluation if the symptoms worsen or if the condition fails to improve as anticipated.   [name], MD

## 2020-04-11 NOTE — Telephone Encounter (Signed)
noted 

## 2020-04-17 ENCOUNTER — Encounter: Payer: Self-pay | Admitting: Internal Medicine

## 2020-04-17 NOTE — Assessment & Plan Note (Signed)
Recommend spot check pressures.  Continue amlodipine, losartan and hydralazine. Follow pressures.  Follow metabolic panel.

## 2020-04-17 NOTE — Assessment & Plan Note (Signed)
On lyrica.  Follow.   

## 2020-04-17 NOTE — Assessment & Plan Note (Signed)
AKI during hospitalization.  Discharge creatinine improved, but still GFR decreased.  Needs f/u with nephrology.  Avoid antiinflammatories.

## 2020-04-17 NOTE — Assessment & Plan Note (Addendum)
Recent hospitalization requiring oxygen.  Weaned off oxygen.  Treated with remdesivir and decadron.  Breathing is better.  Still with some cough and congestion.  Overall feels improved. Follow.  Robitussin/mucinex for cough, etc. Will need f/u cxr.

## 2020-04-17 NOTE — Assessment & Plan Note (Signed)
On lipitor.  Low cholesterol diet and exercise.  Follow lipid panel and liver function tests.   

## 2020-04-17 NOTE — Assessment & Plan Note (Signed)
Follow cbc.  

## 2020-04-17 NOTE — Assessment & Plan Note (Signed)
On plavix.  Follow.  No recurring symptoms.

## 2020-04-17 NOTE — Assessment & Plan Note (Signed)
Sugars previously have been poorly controlled.  On insulin.  Low carb diet and exercise.  Follow met b and a1c.

## 2020-04-17 NOTE — Assessment & Plan Note (Signed)
On thyroid replacement.  Follow tsh.  

## 2020-04-24 ENCOUNTER — Telehealth: Payer: Self-pay | Admitting: Internal Medicine

## 2020-04-24 NOTE — Telephone Encounter (Signed)
Pt said that Dr. Nicki Reaper told him to give her a call. He didn't go into detail.

## 2020-04-24 NOTE — Telephone Encounter (Signed)
Please call and see what he needs.

## 2020-04-24 NOTE — Telephone Encounter (Signed)
Pts wife recently passed. Wanted to make sure you did not try to call him for something in particular before I reached out to him

## 2020-04-25 ENCOUNTER — Other Ambulatory Visit: Payer: Self-pay | Admitting: Internal Medicine

## 2020-04-25 ENCOUNTER — Other Ambulatory Visit: Payer: Self-pay

## 2020-04-25 ENCOUNTER — Other Ambulatory Visit (INDEPENDENT_AMBULATORY_CARE_PROVIDER_SITE_OTHER): Payer: Managed Care, Other (non HMO)

## 2020-04-25 DIAGNOSIS — E875 Hyperkalemia: Secondary | ICD-10-CM

## 2020-04-25 LAB — CBC WITH DIFFERENTIAL/PLATELET
Basophils Absolute: 0 10*3/uL (ref 0.0–0.2)
Basos: 1 %
EOS (ABSOLUTE): 0.1 10*3/uL (ref 0.0–0.4)
Eos: 1 %
Hematocrit: 25.7 % — ABNORMAL LOW (ref 37.5–51.0)
Hemoglobin: 8.5 g/dL — ABNORMAL LOW (ref 13.0–17.7)
Immature Grans (Abs): 0 10*3/uL (ref 0.0–0.1)
Immature Granulocytes: 0 %
Lymphocytes Absolute: 1.1 10*3/uL (ref 0.7–3.1)
Lymphs: 21 %
MCH: 28.5 pg (ref 26.6–33.0)
MCHC: 33.1 g/dL (ref 31.5–35.7)
MCV: 86 fL (ref 79–97)
Monocytes Absolute: 0.5 10*3/uL (ref 0.1–0.9)
Monocytes: 9 %
Neutrophils Absolute: 3.6 10*3/uL (ref 1.4–7.0)
Neutrophils: 68 %
Platelets: 133 10*3/uL — ABNORMAL LOW (ref 150–450)
RBC: 2.98 x10E6/uL — ABNORMAL LOW (ref 4.14–5.80)
RDW: 13.1 % (ref 11.6–15.4)
WBC: 5.2 10*3/uL (ref 3.4–10.8)

## 2020-04-25 LAB — BASIC METABOLIC PANEL
BUN/Creatinine Ratio: 15 (ref 9–20)
BUN: 35 mg/dL — ABNORMAL HIGH (ref 6–24)
CO2: 20 mmol/L (ref 20–29)
Calcium: 8.9 mg/dL (ref 8.7–10.2)
Chloride: 113 mmol/L — ABNORMAL HIGH (ref 96–106)
Creatinine, Ser: 2.41 mg/dL — ABNORMAL HIGH (ref 0.76–1.27)
GFR calc Af Amer: 35 mL/min/{1.73_m2} — ABNORMAL LOW (ref >59)
GFR calc non Af Amer: 30 mL/min/{1.73_m2} — ABNORMAL LOW (ref >59)
Glucose: 93 mg/dL (ref 65–99)
Potassium: 5.7 mmol/L — ABNORMAL HIGH (ref 3.5–5.2)
Sodium: 144 mmol/L (ref 134–144)

## 2020-04-25 LAB — IRON AND TIBC
Iron Saturation: 19 % (ref 15–55)
Iron: 43 ug/dL (ref 38–169)
Total Iron Binding Capacity: 222 ug/dL — ABNORMAL LOW (ref 250–450)
UIBC: 179 ug/dL (ref 111–343)

## 2020-04-25 LAB — POTASSIUM: Potassium: 5.4 mEq/L — ABNORMAL HIGH (ref 3.5–5.1)

## 2020-04-25 NOTE — Addendum Note (Signed)
Addended by: Lars Masson on: 04/25/2020 11:03 AM   Modules accepted: Orders

## 2020-04-25 NOTE — Telephone Encounter (Signed)
Patient did not need anything in particular. Tried to get in touch with patient to remind him to go over and have his labs drawn this PM. [facility] to [facility]. Called [facility] and labs have not been done yet. LM for patient to call back.

## 2020-04-25 NOTE — Progress Notes (Signed)
Order placed for f/u potassium check.  

## 2020-04-26 LAB — POTASSIUM

## 2020-04-30 LAB — CUP PACEART REMOTE DEVICE CHECK
Date Time Interrogation Session: 20210902030628
Implantable Pulse Generator Implant Date: 20190604

## 2020-05-02 ENCOUNTER — Ambulatory Visit (INDEPENDENT_AMBULATORY_CARE_PROVIDER_SITE_OTHER): Payer: Managed Care, Other (non HMO) | Admitting: *Deleted

## 2020-05-02 DIAGNOSIS — Z8673 Personal history of transient ischemic attack (TIA), and cerebral infarction without residual deficits: Secondary | ICD-10-CM

## 2020-05-03 ENCOUNTER — Other Ambulatory Visit: Payer: Self-pay | Admitting: Internal Medicine

## 2020-05-04 NOTE — Progress Notes (Signed)
[facility] Summary Report / Loop Recorder 

## 2020-05-08 ENCOUNTER — Other Ambulatory Visit: Payer: Self-pay

## 2020-05-08 ENCOUNTER — Other Ambulatory Visit
Admission: RE | Admit: 2020-05-08 | Discharge: 2020-05-08 | Disposition: A | Discharge status: Home / Self Care | Payer: Self-pay | Attending: Internal Medicine | Admitting: Internal Medicine

## 2020-05-08 DIAGNOSIS — E875 Hyperkalemia: Secondary | ICD-10-CM

## 2020-05-08 LAB — POTASSIUM: Potassium: 4.5 mmol/L (ref 3.5–5.1)

## 2020-05-08 NOTE — Progress Notes (Signed)
Ordered Potassium test verbalized by Dr. Nicki Reaper on 04/26/2020. Gregory Crane has already contacted patient and informed them to get the labs drawn at [facility]. Patient is currently at [facility] waiting on orders.

## 2020-05-08 NOTE — Addendum Note (Signed)
Addended by: Leeanne Rio on: 05/08/2020 03:46 PM   Modules accepted: Orders

## 2020-05-11 ENCOUNTER — Ambulatory Visit: Payer: Self-pay | Admitting: Pharmacist

## 2020-05-11 NOTE — Chronic Care Management (AMB) (Signed)
  Chronic Care Management   Note  05/11/2020 Name: [name] MRN: [id number] DOB: 1970/12/10  Gregory Crane is a 49 y.o. year old male who is a primary care patient of [name], MD. The CCM team was consulted for assistance with chronic disease management and care coordination needs.    Contacted patient for scheduled appt follow up. Patient's wife recently passed from [place]. He notes that he is doing "OK", and denies any specific needs at this time. Per chart review, there was a note of him being uninsured. He notes he had been on his wife's insurance, so lost insurance when she passed. However, he signed up for COBRA and is insured now. Denies medication cost concerns. Requests that we postpone our phone appt until after he sees PCP next week. Scheduled f/u phone call in ~6 weeks. Encouraged patient to contact the office with any questions, concerns, or needs.  Catie [name], PharmD, [place], CPP Clinical Pharmacist [place] [phone]

## 2020-05-12 ENCOUNTER — Other Ambulatory Visit: Payer: Self-pay | Admitting: Internal Medicine

## 2020-05-16 ENCOUNTER — Other Ambulatory Visit: Payer: Self-pay

## 2020-05-16 ENCOUNTER — Encounter: Payer: Self-pay | Admitting: Internal Medicine

## 2020-05-16 ENCOUNTER — Ambulatory Visit (INDEPENDENT_AMBULATORY_CARE_PROVIDER_SITE_OTHER): Payer: Managed Care, Other (non HMO) | Admitting: Internal Medicine

## 2020-05-16 VITALS — BP 160/88 | HR 79 | Temp 97.4°F | Resp 16 | Ht 72.0 in | Wt 310.8 lb

## 2020-05-16 DIAGNOSIS — N1832 Chronic kidney disease, stage 3b: Secondary | ICD-10-CM

## 2020-05-16 DIAGNOSIS — I10 Essential (primary) hypertension: Secondary | ICD-10-CM

## 2020-05-16 DIAGNOSIS — D649 Anemia, unspecified: Secondary | ICD-10-CM | POA: Diagnosis not present

## 2020-05-16 DIAGNOSIS — Z794 Long term (current) use of insulin: Secondary | ICD-10-CM

## 2020-05-16 DIAGNOSIS — I890 Lymphedema, not elsewhere classified: Secondary | ICD-10-CM

## 2020-05-16 DIAGNOSIS — J1282 Pneumonia due to coronavirus disease 2019: Secondary | ICD-10-CM

## 2020-05-16 DIAGNOSIS — Z8673 Personal history of transient ischemic attack (TIA), and cerebral infarction without residual deficits: Secondary | ICD-10-CM

## 2020-05-16 DIAGNOSIS — Z23 Encounter for immunization: Secondary | ICD-10-CM

## 2020-05-16 DIAGNOSIS — E785 Hyperlipidemia, unspecified: Secondary | ICD-10-CM

## 2020-05-16 DIAGNOSIS — I6523 Occlusion and stenosis of bilateral carotid arteries: Secondary | ICD-10-CM

## 2020-05-16 DIAGNOSIS — U071 COVID-19: Secondary | ICD-10-CM

## 2020-05-16 DIAGNOSIS — E538 Deficiency of other specified B group vitamins: Secondary | ICD-10-CM | POA: Diagnosis not present

## 2020-05-16 DIAGNOSIS — E039 Hypothyroidism, unspecified: Secondary | ICD-10-CM | POA: Diagnosis not present

## 2020-05-16 DIAGNOSIS — E11319 Type 2 diabetes mellitus with unspecified diabetic retinopathy without macular edema: Secondary | ICD-10-CM

## 2020-05-16 DIAGNOSIS — G629 Polyneuropathy, unspecified: Secondary | ICD-10-CM

## 2020-05-16 DIAGNOSIS — N184 Chronic kidney disease, stage 4 (severe): Secondary | ICD-10-CM

## 2020-05-16 LAB — CBC WITH DIFFERENTIAL/PLATELET
Basophils Absolute: 0 10*3/uL (ref 0.0–0.1)
Basophils Relative: 0.7 % (ref 0.0–3.0)
Eosinophils Absolute: 0.1 10*3/uL (ref 0.0–0.7)
Eosinophils Relative: 1.6 % (ref 0.0–5.0)
HCT: 27.2 % — ABNORMAL LOW (ref 39.0–52.0)
Hemoglobin: 9.2 g/dL — ABNORMAL LOW (ref 13.0–17.0)
Lymphocytes Relative: 19.7 % (ref 12.0–46.0)
Lymphs Abs: 1 10*3/uL (ref 0.7–4.0)
MCHC: 33.8 g/dL (ref 30.0–36.0)
MCV: 88.6 fl (ref 78.0–100.0)
Monocytes Absolute: 0.4 10*3/uL (ref 0.1–1.0)
Monocytes Relative: 8.5 % (ref 3.0–12.0)
Neutro Abs: 3.6 10*3/uL (ref 1.4–7.7)
Neutrophils Relative %: 69.5 % (ref 43.0–77.0)
Platelets: 167 10*3/uL (ref 150.0–400.0)
RBC: 3.07 Mil/uL — ABNORMAL LOW (ref 4.22–5.81)
RDW: 13.9 % (ref 11.5–15.5)
WBC: 5.1 10*3/uL (ref 4.0–10.5)

## 2020-05-16 LAB — HEPATIC FUNCTION PANEL
ALT: 14 U/L (ref 0–53)
AST: 22 U/L (ref 0–37)
Albumin: 3.6 g/dL (ref 3.5–5.2)
Alkaline Phosphatase: 77 U/L (ref 39–117)
Bilirubin, Direct: 0.1 mg/dL (ref 0.0–0.3)
Total Bilirubin: 0.3 mg/dL (ref 0.2–1.2)
Total Protein: 6.1 g/dL (ref 6.0–8.3)

## 2020-05-16 LAB — BASIC METABOLIC PANEL
BUN: 34 mg/dL — ABNORMAL HIGH (ref 6–23)
CO2: 24 mEq/L (ref 19–32)
Calcium: 8.4 mg/dL (ref 8.4–10.5)
Chloride: 111 mEq/L (ref 96–112)
Creatinine, Ser: 2.45 mg/dL — ABNORMAL HIGH (ref 0.40–1.50)
GFR: 28.15 mL/min — ABNORMAL LOW (ref >60.00)
Glucose, Bld: 122 mg/dL — ABNORMAL HIGH (ref 70–99)
Potassium: 4.1 mEq/L (ref 3.5–5.1)
Sodium: 142 mEq/L (ref 135–145)

## 2020-05-16 LAB — VITAMIN B12: Vitamin B-12: 226 pg/mL (ref 211–911)

## 2020-05-16 LAB — IBC + FERRITIN
Ferritin: 199.5 ng/mL (ref 22.0–322.0)
Iron: 49 ug/dL (ref 42–165)
Saturation Ratios: 18 % — ABNORMAL LOW (ref 20.0–50.0)
Transferrin: 194 mg/dL — ABNORMAL LOW (ref 212.0–360.0)

## 2020-05-16 LAB — TSH: TSH: 15.38 u[IU]/mL — ABNORMAL HIGH (ref 0.35–4.50)

## 2020-05-16 NOTE — Progress Notes (Signed)
Patient ID: Gregory Crane, male   DOB: 09-25-1970, 49 y.o.   MRN: [id number]   Subjective:    Patient ID: Gregory Crane, male    DOB: 1970/09/05, 49 y.o.   MRN: [id number]  HPI This visit occurred during the SARS-CoV-2 public health emergency.  Safety protocols were in place, including screening questions prior to the visit, additional usage of staff PPE, and extensive cleaning of exam room while observing appropriate contact time as indicated for disinfecting solutions.  Patient here for a scheduled follow up.  Admitted 04/03/20 - 04/07/20 with covid.  CXR with left lung basilar density - possible infiltrate.  Found to have AKI with CKD, elevated troponin. Treated with remdesivir and decadron.  Since his discharge, his breathing has been stable.  No increased cough or congestion. Does report increased fatigue.  No chest pain.  No nausea or vomiting.  Bowels are moving.  Increased leg swelling.  Affecting his walking.  Sister helping him out at home.  Increased stress - trying to cope with his wife's unexpected death from covid.    Past Medical History:  Diagnosis Date  . Allergy   . Anemia   . Bell's palsy   . Diabetes mellitus without complication (HCC)    diet controlled  . Hypertension   . Hypothyroidism   . Kidney stones   . Pseudotumor cerebri   . Stroke [facility])    Past Surgical History:  Procedure Laterality Date  . COLONOSCOPY WITH PROPOFOL N/A 03/30/2020   Procedure: COLONOSCOPY WITH PROPOFOL;  Surgeon: Lesly Rubenstein, MD;  Location: [facility] ENDOSCOPY;  Service: Endoscopy;  Laterality: N/A;  . LOOP RECORDER INSERTION N/A 01/27/2018   Procedure: LOOP RECORDER INSERTION;  Surgeon: [name], MD;  Location: [place] [facility];  Service: Cardiovascular;  Laterality: N/A;  . LUMBAR PUNCTURE     as child  . NO PAST SURGERIES    . TEE WITHOUT CARDIOVERSION N/A 01/07/2018   Procedure: TRANSESOPHAGEAL ECHOCARDIOGRAM (TEE);  Surgeon: Minna Merritts, MD;  Location: [facility] ORS;   Service: Cardiovascular;  Laterality: N/A;   Family History  Problem Relation Age of Onset  . Breast cancer Mother   . Diabetes Father   . Diabetes Sister   . Arthritis Maternal Grandmother   . Diabetes Maternal Grandmother    Social History   Socioeconomic History  . Marital status: Married    Spouse name: [name]  . Number of children: 2  . Years of education: Not on file  . Highest education level: Not on file  Occupational History  . Not on file  Tobacco Use  . Smoking status: Never Smoker  . Smokeless tobacco: Never Used  Vaping Use  . Vaping Use: Never used  Substance and Sexual Activity  . Alcohol use: No    Alcohol/week: 0.0 standard drinks    Comment: very rarely (1-2/year)  . Drug use: No  . Sexual activity: Yes  Other Topics Concern  . Not on file  Social History Narrative   Lives at home with wife, independent at baseline.   Social Determinants of Health   Financial Resource Strain:   . Difficulty of Paying Living Expenses: Not on file  Food Insecurity:   . Worried About Charity fundraiser in the Last Year: Not on file  . Ran Out of Food in the Last Year: Not on file  Transportation Needs:   . Lack of Transportation (Medical): Not on file  . Lack of Transportation (Non-Medical): Not on  file  Physical Activity:   . Days of Exercise per Week: Not on file  . Minutes of Exercise per Session: Not on file  Stress:   . Feeling of Stress : Not on file  Social Connections:   . Frequency of Communication with Friends and Family: Not on file  . Frequency of Social Gatherings with Friends and Family: Not on file  . Attends Religious Services: Not on file  . Active Member of Clubs or Organizations: Not on file  . Attends Archivist Meetings: Not on file  . Marital Status: Not on file    Outpatient Encounter Medications as of 05/16/2020  Medication Sig  . amLODipine (NORVASC) 10 MG tablet Take 1 tablet (10 mg total) by mouth daily.  Marland Kitchen ascorbic  acid (VITAMIN C) 500 MG tablet Take 1 tablet (500 mg total) by mouth daily.  Marland Kitchen aspirin (ASPIRIN LOW DOSE) 81 MG EC tablet TAKE 1 TABLET(81 MG) BY MOUTH DAILY  . atorvastatin (LIPITOR) 80 MG tablet TAKE 1 TABLET(80 MG) BY MOUTH DAILY  . clopidogrel (PLAVIX) 75 MG tablet TAKE 1 TABLET(75 MG) BY MOUTH DAILY  . cyanocobalamin (,VITAMIN B-12,) 1000 MCG/ML injection Inject 1 mL into the skin once a week for 3 weeks then once every 30 days.  . DULoxetine (CYMBALTA) 20 MG capsule Take 20 mg by mouth daily.   Marland Kitchen glucose blood (FREESTYLE LITE) test strip Check blood sugars twice a day (Dx. 250.02)  . guaiFENesin-dextromethorphan (ROBITUSSIN DM) 100-10 MG/5ML syrup Take 10 mLs by mouth every 4 (four) hours as needed for cough.  Marland Kitchen HUMALOG 100 UNIT/ML injection INJECT 4 TO 6 UNITS UNDER THE SKIN THREE TIMES DAILY  . hydrALAZINE (APRESOLINE) 50 MG tablet TAKE 1 TABLET(50 MG) BY MOUTH THREE TIMES DAILY  . insulin glargine (LANTUS) 100 UNIT/ML injection ADMINISTER 15 UNITS UNDER THE SKIN EVERY NIGHT AT BEDTIME  . Insulin Syringe-Needle U-100 (INSULIN SYRINGE .5CC/30GX5/16") 30G X 5/16" 0.5 ML MISC USE AS DIRECTED WITH INSULIN  . levothyroxine (SYNTHROID) 125 MCG tablet Take 1 tablet (125 mcg total) by mouth daily before breakfast.  . losartan (COZAAR) 100 MG tablet TAKE 1 TABLET BY MOUTH DAILY  . Multiple Vitamin (MULTIVITAMIN WITH MINERALS) TABS tablet Take 1 tablet by mouth daily.  [name] DELICA LANCETS FINE MISC 1 applicator by Other route 4 (four) times daily.  . pregabalin (LYRICA) 100 MG capsule TAKE 1 CAPSULE(100 MG) BY MOUTH THREE TIMES DAILY  . Semaglutide, 1 MG/DOSE, (OZEMPIC, 1 MG/DOSE,) 4 MG/3ML SOPN Inject 0.75 mLs (1 mg total) into the skin once a week.  . Syringe/Needle, Disp, (SYRINGE 3CC/25GX1") 25G X 1" 3 ML MISC Use as directed to administer b12 injections.  Marland Kitchen zinc sulfate 220 (50 Zn) MG capsule Take 1 capsule (220 mg total) by mouth daily.   No facility-administered encounter medications  on file as of 05/16/2020.    Review of Systems  Constitutional: Positive for fatigue.       Has lost some weight.   HENT: Negative for congestion and sinus pressure.   Respiratory: Negative for cough and chest tightness.        No increased sob.   Cardiovascular: Positive for leg swelling. Negative for chest pain and palpitations.  Gastrointestinal: Negative for abdominal pain, diarrhea, nausea and vomiting.  Genitourinary: Negative for difficulty urinating and dysuria.  Musculoskeletal: Negative for joint swelling and myalgias.  Skin: Negative for color change and rash.  Neurological: Negative for dizziness, light-headedness and headaches.  Psychiatric/Behavioral: Negative for agitation.  Increased stress - as outlined.        Objective:    Physical Exam Constitutional:      General: He is not in acute distress.    Appearance: Normal appearance. He is well-developed.  HENT:     Head: Normocephalic and atraumatic.     Right Ear: External ear normal.     Left Ear: External ear normal.  Eyes:     General: No scleral icterus.       Right eye: No discharge.        Left eye: No discharge.     Conjunctiva/sclera: Conjunctivae normal.  Cardiovascular:     Rate and Rhythm: Normal rate and regular rhythm.  Pulmonary:     Effort: Pulmonary effort is normal. No respiratory distress.     Breath sounds: Normal breath sounds.  Abdominal:     General: Bowel sounds are normal.     Palpations: Abdomen is soft.     Tenderness: There is no abdominal tenderness.  Musculoskeletal:        General: Swelling present. No tenderness.     Cervical back: Neck supple. No tenderness.  Lymphadenopathy:     Cervical: No cervical adenopathy.  Skin:    Findings: No erythema or rash.  Neurological:     Mental Status: He is alert.  Psychiatric:        Mood and Affect: Mood normal.        Behavior: Behavior normal.     BP (!) 160/88   Pulse 79   Temp (!) 97.4 F (36.3 C) (Oral)   Resp  16   Ht 6' (1.829 m)   Wt (!) 310 lb 12.8 oz (141 kg)   SpO2 98%   BMI 42.15 kg/m  Wt Readings from Last 3 Encounters:  05/16/20 (!) 310 lb 12.8 oz (141 kg)  04/11/20 (!) 320 lb (145.2 kg)  04/04/20 (!) 300 lb 0.7 oz (136.1 kg)     Lab Results  Component Value Date   WBC 5.1 05/16/2020   HGB 9.2 Repeated and verified X2. (L) 05/16/2020   HCT 27.2 (L) 05/16/2020   PLT 167.0 05/16/2020   GLUCOSE 122 (H) 05/16/2020   CHOL 109 03/28/2020   TRIG 93 04/04/2020   HDL 31 (L) 03/28/2020   LDLCALC 60 03/28/2020   ALT 14 05/16/2020   AST 22 05/16/2020   NA 142 05/16/2020   K 4.1 05/16/2020   CL 111 05/16/2020   CREATININE 2.45 (H) 05/16/2020   BUN 34 (H) 05/16/2020   CO2 24 05/16/2020   TSH 15.38 (H) 05/16/2020   INR 0.85 04/09/2018   HGBA1C 6.7 (H) 04/04/2020       Assessment & Plan:   Problem List Items Addressed This Visit    Pneumonia due to COVID-19 virus    Recent hospitalization requiring oxygen.  Off oxygen.  Breathing stable.  No increased cough/congestion.  Will need f/u cxr to confirm clear.       Neuropathy    On lyrica.        Lymphedema    Persistent increased swelling - lower extremities.  Has tried compression hose.  Increased pain and discomfort.  Refer back to vascular surgery for evaluation and question of need for lymphedema pump.        Relevant Orders   Ambulatory referral to Vascular Surgery   Hypothyroidism    On thyroid replacement.  Follow tsh.       Hyperlipidemia    On lipitor.  Low cholesterol diet and exercise.  Follow lipid panel and liver function tests.        History of CVA (cerebrovascular accident)    Continue on plavix.       Essential hypertension    Blood pressure on recheck improved.  Still running a little high.  Continue on amlodipine, losartan, hydralazine.  Follow pressures.  Follow metabolic panel.        Diabetes ([place])    Sugars have been poorly controlled.  Insulin therapy.  Needs to spot check sugars.  Record  readings.  Send in - will need to adjust medication.  Having a hard time now - with increased stress, etc.  Discussed with him.  Has good support.  Follow.        CKD (chronic kidney disease) stage 4, GFR 15-29 ml/min (HCC)    AKI during hospitalization.  Discharge creatinine improved - from admission.  Avoid antiinflammatories.  F/u with nephrology.        Carotid stenosis    Needs f/u with vascular surgery.       Relevant Orders   Ambulatory referral to Vascular Surgery   B12 deficiency - Primary   Relevant Orders   Vitamin B12 (Completed)   Anemia    Recheck cbc.  hgb low - during hospitalization.        Other Visit Diagnoses    Stage 3b chronic kidney disease       Need for immunization against influenza       Relevant Orders   Flu Vaccine QUAD 36+ mos IM (Completed)       [name], MD

## 2020-05-20 DIAGNOSIS — Z8616 Personal history of COVID-19: Secondary | ICD-10-CM | POA: Insufficient documentation

## 2020-05-20 DIAGNOSIS — J9620 Acute and chronic respiratory failure, unspecified whether with hypoxia or hypercapnia: Secondary | ICD-10-CM | POA: Insufficient documentation

## 2020-05-21 ENCOUNTER — Encounter: Payer: Self-pay | Admitting: Internal Medicine

## 2020-05-21 NOTE — Assessment & Plan Note (Signed)
AKI during hospitalization.  Discharge creatinine improved - from admission.  Avoid antiinflammatories.  F/u with nephrology.

## 2020-05-21 NOTE — Assessment & Plan Note (Signed)
Recent hospitalization requiring oxygen.  Off oxygen.  Breathing stable.  No increased cough/congestion.  Will need f/u cxr to confirm clear.

## 2020-05-21 NOTE — Assessment & Plan Note (Signed)
Recheck cbc.  hgb low - during hospitalization.

## 2020-05-21 NOTE — Assessment & Plan Note (Signed)
Continue on plavix °

## 2020-05-21 NOTE — Assessment & Plan Note (Signed)
On lyrica

## 2020-05-21 NOTE — Assessment & Plan Note (Signed)
Persistent increased swelling - lower extremities.  Has tried compression hose.  Increased pain and discomfort.  Refer back to vascular surgery for evaluation and question of need for lymphedema pump.

## 2020-05-21 NOTE — Assessment & Plan Note (Signed)
Sugars have been poorly controlled.  Insulin therapy.  Needs to spot check sugars.  Record readings.  Send in - will need to adjust medication.  Having a hard time now - with increased stress, etc.  Discussed with him.  Has good support.  Follow.

## 2020-05-21 NOTE — Assessment & Plan Note (Signed)
Needs f/u with vascular surgery.

## 2020-05-21 NOTE — Assessment & Plan Note (Signed)
On thyroid replacement.  Follow tsh.  

## 2020-05-21 NOTE — Assessment & Plan Note (Signed)
Blood pressure on recheck improved.  Still running a little high.  Continue on amlodipine, losartan, hydralazine.  Follow pressures.  Follow metabolic panel.

## 2020-05-21 NOTE — Assessment & Plan Note (Signed)
On lipitor.  Low cholesterol diet and exercise.  Follow lipid panel and liver function tests.   

## 2020-05-23 ENCOUNTER — Telehealth: Payer: Self-pay | Admitting: Internal Medicine

## 2020-05-23 NOTE — Telephone Encounter (Signed)
[facility] Called to schedule a HFU. They said that he should be discharged tomorrow

## 2020-05-23 NOTE — Telephone Encounter (Signed)
Noted. Will follow as appropriate.  

## 2020-05-23 NOTE — Telephone Encounter (Signed)
Appt scheduled for 10/7

## 2020-05-24 ENCOUNTER — Telehealth: Payer: Self-pay | Admitting: Internal Medicine

## 2020-05-24 NOTE — Telephone Encounter (Signed)
Patient was returning call for results 

## 2020-05-25 ENCOUNTER — Other Ambulatory Visit: Payer: Self-pay

## 2020-05-25 ENCOUNTER — Telehealth: Payer: Self-pay

## 2020-05-25 MED ORDER — LEVOTHYROXINE SODIUM 137 MCG PO TABS: 125.0000 ug | ORAL_TABLET | Freq: Every day | ORAL | 1 refills | Status: DC

## 2020-05-25 NOTE — Telephone Encounter (Signed)
First attempt to reach patient since discharge from [facility]. No answer. Left message to keep all scheduled appointments. Will follow with second phone call tomorrow.

## 2020-05-26 NOTE — Telephone Encounter (Signed)
Second attempt to reach patient for transitional care management. No answer. Left message encouraging patient to call the office as needed prior to scheduled appointment 06/01/20.

## 2020-05-31 ENCOUNTER — Other Ambulatory Visit: Payer: Self-pay | Admitting: Internal Medicine

## 2020-06-01 ENCOUNTER — Inpatient Hospital Stay: Payer: Self-pay | Admitting: Internal Medicine

## 2020-06-02 ENCOUNTER — Ambulatory Visit (INDEPENDENT_AMBULATORY_CARE_PROVIDER_SITE_OTHER): Payer: Managed Care, Other (non HMO) | Admitting: Vascular Surgery

## 2020-06-02 ENCOUNTER — Encounter (INDEPENDENT_AMBULATORY_CARE_PROVIDER_SITE_OTHER): Payer: Self-pay | Admitting: Vascular Surgery

## 2020-06-02 ENCOUNTER — Other Ambulatory Visit: Payer: Self-pay

## 2020-06-02 VITALS — BP 177/75 | HR 80 | Resp 16 | Wt 313.4 lb

## 2020-06-02 DIAGNOSIS — I890 Lymphedema, not elsewhere classified: Secondary | ICD-10-CM | POA: Diagnosis not present

## 2020-06-02 DIAGNOSIS — N184 Chronic kidney disease, stage 4 (severe): Secondary | ICD-10-CM

## 2020-06-02 DIAGNOSIS — I10 Essential (primary) hypertension: Secondary | ICD-10-CM | POA: Diagnosis not present

## 2020-06-02 LAB — CUP PACEART REMOTE DEVICE CHECK
Date Time Interrogation Session: 20211005030901
Implantable Pulse Generator Implant Date: 20190604

## 2020-06-02 NOTE — Assessment & Plan Note (Signed)
Weight loss would be of significant benefit to reduce his lower extremity swelling

## 2020-06-02 NOTE — Assessment & Plan Note (Signed)
The patient's lymphedema continues to progress despite appropriate conservative measures.  He clearly needs a lymphedema pump as an adjuvant therapy to try to improve his symptoms.  We will try to get that obtained today.  He will follow up in 6 months.

## 2020-06-02 NOTE — Patient Instructions (Signed)
Lymphedema  Lymphedema is swelling that is caused by the abnormal collection of lymph in the tissues under the skin. Lymph is fluid from the tissues in your body that is removed through the lymphatic system. This system is part of your body's defense system (immune system) and includes lymph nodes and lymph vessels. The lymph vessels collect and carry the excess fluid, fats, proteins, and wastes from the tissues of the body to the bloodstream. This system also works to clean and remove bacteria and waste products from the body. Lymphedema occurs when the lymphatic system is blocked. When the lymph vessels or lymph nodes are blocked or damaged, lymph does not drain properly. This causes an abnormal buildup of lymph, which leads to swelling in the affected area. This may include the trunk area, or an arm or leg. Lymphedema cannot be cured by medicines, but various methods can be used to help reduce the swelling. There are two types of lymphedema: primary lymphedema and secondary lymphedema. What are the causes? The cause of this condition depends on the type of lymphedema that you have.  Primary lymphedema is caused by the absence of lymph vessels or having abnormal lymph vessels at birth.  Secondary lymphedema occurs when lymph vessels are blocked or damaged. Secondary lymphedema is more common. Common causes of lymph vessel blockage include: ? Skin infection, such as cellulitis. ? Infection by parasites (filariasis). ? Injury. ? Radiation therapy. ? Cancer. ? Formation of scar tissue. ? Surgery. What are the signs or symptoms? Symptoms of this condition include:  Swelling of the arm or leg.  A heavy or tight feeling in the arm or leg.  Swelling of the feet, toes, or fingers. Shoes or rings may fit more tightly than before.  Redness of the skin over the affected area.  Limited movement of the affected limb.  Sensitivity to touch or discomfort in the affected limb. How is this  diagnosed? This condition may be diagnosed based on:  Your symptoms and medical history.  A physical exam.  Bioimpedance spectroscopy. In this test, painless electrical currents are used to measure fluid levels in your body.  Imaging tests, such as: ? Lymphoscintigraphy. In this test, a low dose of a radioactive substance is injected to trace the flow of lymph through the lymph vessels. ? MRI. ? CT scan. ? Duplex ultrasound. This test uses sound waves to produce images of the vessels and the blood flow on a screen. ? Lymphangiography. In this test, a contrast dye is injected into the lymph vessel to help show blockages. How is this treated? Treatment for this condition may depend on the cause of your lymphedema. Treatment may include:  Complete decongestive therapy (CDT). This is done by a certified lymphedema therapist to reduce fluid congestion. This therapy includes: ? Manual lymph drainage. This is a special massage technique that promotes lymph drainage out of a limb. ? Skin care. ? Compression wrapping of the affected area. ? Specific exercises. Certain exercises can help fluid move out of the affected limb.  Compression. Various methods may be used to apply pressure to the affected limb to reduce the swelling. They include: ? Wearing compression stockings or sleeves on the affected limb. ? Wrapping the affected limb with special bandages.  Surgery. This is usually done for severe cases only. For example, surgery may be done if you have trouble moving the limb or if the swelling does not get better with other treatments. If an underlying condition is causing the lymphedema, treatment   for that condition will be done. For example, antibiotic medicines may be used to treat an infection. Follow these instructions at home: Self-care  The affected area is more likely to become injured or infected. Take these steps to help prevent infection: ? Keep the affected area clean and  dry. ? Use approved creams or lotions to keep the skin moisturized. ? Protect your skin from cuts:  Use gloves while cooking or gardening.  Do not walk barefoot.  If you shave the affected area, use an electric razor.  Do not wear tight clothes, shoes, or jewelry.  Eat a healthy diet that includes a lot of fruits and vegetables. Activity  Exercise regularly as directed by your health care provider.  Do not sit with your legs crossed.  When possible, keep the affected limb raised (elevated) above the level of your heart.  Avoid carrying things with an arm that is affected by lymphedema. General instructions  Wear compression stockings or sleeves as told by your health care provider.  Note any changes in size of the affected limb. You may be instructed to take regular measurements and keep track of them.  Take over-the-counter and prescription medicines only as told by your health care provider.  If you were prescribed an antibiotic medicine, take or apply it as told by your health care provider. Do not stop using the antibiotic even if you start to feel better.  Do not use heating pads or ice packs over the affected area.  Avoid having blood draws, IV insertions, or blood pressure checked on the affected limb.  Keep all follow-up visits as told by your health care provider. This is important. Contact a health care provider if you:  Continue to have swelling in your limb.  Have a cut that does not heal.  Have redness or pain in the affected area. Get help right away if you:  Have new swelling in your limb that comes on suddenly.  Develop purplish spots, rash or sores (lesions) on your affected limb.  Have shortness of breath.  Have a fever or chills. Summary  Lymphedema is swelling that is caused by the abnormal collection of lymph in the tissues under the skin.  Lymph is fluid from the tissues in your body that is removed through the lymphatic system. This  system collects and carries excess fluid, fats, proteins, and wastes from the tissues of the body to the bloodstream.  Lymphedema causes swelling, pain, and redness in the affected area. This may include the trunk area, or an arm or leg.  Treatment for this condition may depend on the cause of your lymphedema. Treatment may include complete decongestive therapy (CDT), compression methods, surgery, or treating the underlying cause. This information is not intended to replace advice given to you by your health care provider. Make sure you discuss any questions you have with your health care provider. Document Revised: 08/25/2017 Document Reviewed: 08/25/2017 [employer] Patient Education  2020 [employer]  

## 2020-06-02 NOTE — Assessment & Plan Note (Signed)
blood pressure control important in reducing the progression of atherosclerotic disease. On appropriate oral medications.  

## 2020-06-02 NOTE — Progress Notes (Signed)
MRN : [id number]  Gregory Crane is a 49 y.o. (June 04, 1971) male who presents with chief complaint of  Chief Complaint  Patient presents with  . Follow-up    est pt ref scott lymphedema  .  History of Present Illness: Patient returns today in follow up of his lymphedema.  His left leg remains markedly swollen with moderate swelling on the right.  He was last seen about 6 months ago and has been diligently wearing compression stockings and elevating his legs, but his symptoms have continued to progress.  He was unable to get the lymphedema pump at his last visit when this was discussed to be an excellent adjuvant therapy.  He has now progressed to stage II and may be even stage III lymphedema with marked swelling and some skin thickening.  This is refractory to compression and elevation.  Current Outpatient Medications  Medication Sig Dispense Refill  . Accu-Chek FastClix Lancets MISC USE TO CHECK BLOOD SUGAR THREE TIMES DAILY AS DIRECTED 306 each 100  . amLODipine (NORVASC) 10 MG tablet Take 1 tablet (10 mg total) by mouth daily. 30 tablet 1  . ascorbic acid (VITAMIN C) 500 MG tablet Take 1 tablet (500 mg total) by mouth daily. 90 tablet 0  . aspirin (ASPIRIN LOW DOSE) 81 MG EC tablet TAKE 1 TABLET(81 MG) BY MOUTH DAILY 90 tablet 1  . atorvastatin (LIPITOR) 80 MG tablet TAKE 1 TABLET(80 MG) BY MOUTH DAILY 90 tablet 0  . clopidogrel (PLAVIX) 75 MG tablet TAKE 1 TABLET(75 MG) BY MOUTH DAILY 90 tablet 2  . cyanocobalamin (,VITAMIN B-12,) 1000 MCG/ML injection Inject 1 mL into the skin once a week for 3 weeks then once every 30 days. 10 mL 0  . DULoxetine (CYMBALTA) 20 MG capsule Take 20 mg by mouth daily.   3  . furosemide (LASIX) 20 MG tablet Take 60 mg by mouth.     Marland Kitchen glucose blood (FREESTYLE LITE) test strip Check blood sugars twice a day (Dx. 250.02) 200 each 1  . guaiFENesin-dextromethorphan (ROBITUSSIN DM) 100-10 MG/5ML syrup Take 10 mLs by mouth every 4 (four) hours as needed for  cough. 118 mL 0  . HUMALOG 100 UNIT/ML injection INJECT 4 TO 6 UNITS UNDER THE SKIN THREE TIMES DAILY 30 mL 0  . hydrALAZINE (APRESOLINE) 50 MG tablet TAKE 1 TABLET(50 MG) BY MOUTH THREE TIMES DAILY 90 tablet 2  . insulin glargine (LANTUS) 100 UNIT/ML injection ADMINISTER 15 UNITS UNDER THE SKIN EVERY NIGHT AT BEDTIME 30 mL 0  . Insulin Syringe-Needle U-100 (INSULIN SYRINGE .5CC/30GX5/16") 30G X 5/16" 0.5 ML MISC USE AS DIRECTED WITH INSULIN 300 each 5  . levothyroxine (SYNTHROID) 137 MCG tablet Take 1 tablet (137 mcg total) by mouth daily before breakfast. 90 tablet 1  . losartan (COZAAR) 100 MG tablet TAKE 1 TABLET BY MOUTH DAILY 90 tablet 1  . Multiple Vitamin (MULTIVITAMIN WITH MINERALS) TABS tablet Take 1 tablet by mouth daily. 90 tablet 0  . pregabalin (LYRICA) 100 MG capsule TAKE 1 CAPSULE(100 MG) BY MOUTH THREE TIMES DAILY 270 capsule 1  . Semaglutide, 1 MG/DOSE, (OZEMPIC, 1 MG/DOSE,) 4 MG/3ML SOPN Inject 0.75 mLs (1 mg total) into the skin once a week. 9 mL 3  . Syringe/Needle, Disp, (SYRINGE 3CC/25GX1") 25G X 1" 3 ML MISC Use as directed to administer b12 injections. 50 each 1  . zinc sulfate 220 (50 Zn) MG capsule Take 1 capsule (220 mg total) by mouth daily. 90 capsule 0  No current facility-administered medications for this visit.    Past Medical History:  Diagnosis Date  . Allergy   . Anemia   . Bell's palsy   . Diabetes mellitus without complication (HCC)    diet controlled  . Hypertension   . Hypothyroidism   . Kidney stones   . Pseudotumor cerebri   . Stroke [facility])     Past Surgical History:  Procedure Laterality Date  . COLONOSCOPY WITH PROPOFOL N/A 03/30/2020   Procedure: COLONOSCOPY WITH PROPOFOL;  Surgeon: Lesly Rubenstein, MD;  Location: [facility] ENDOSCOPY;  Service: Endoscopy;  Laterality: N/A;  . LOOP RECORDER INSERTION N/A 01/27/2018   Procedure: LOOP RECORDER INSERTION;  Surgeon: [name], MD;  Location: [facility];  Service: Cardiovascular;   Laterality: N/A;  . LUMBAR PUNCTURE     as child  . NO PAST SURGERIES    . TEE WITHOUT CARDIOVERSION N/A 01/07/2018   Procedure: TRANSESOPHAGEAL ECHOCARDIOGRAM (TEE);  Surgeon: Minna Merritts, MD;  Location: [facility] ORS;  Service: Cardiovascular;  Laterality: N/A;     Social History   Tobacco Use  . Smoking status: Never Smoker  . Smokeless tobacco: Never Used  Vaping Use  . Vaping Use: Never used  Substance Use Topics  . Alcohol use: No    Alcohol/week: 0.0 standard drinks    Comment: very rarely (1-2/year)  . Drug use: No       Family History  Problem Relation Age of Onset  . Breast cancer Mother   . Diabetes Father   . Diabetes Sister   . Arthritis Maternal Grandmother   . Diabetes Maternal Grandmother      No Known Allergies   REVIEW OF SYSTEMS (Negative unless checked)  Constitutional: [] Weight loss  [] Fever  [] Chills Cardiac: [] Chest pain   [] Chest pressure   [] Palpitations   [] Shortness of breath when laying flat   [] Shortness of breath at rest   [] Shortness of breath with exertion. Vascular:  [] Pain in legs with walking   [] Pain in legs at rest   [] Pain in legs when laying flat   [] Claudication   [] Pain in feet when walking  [] Pain in feet at rest  [] Pain in feet when laying flat   [] History of DVT   [] Phlebitis   [x] Swelling in legs   [] Varicose veins   [] Non-healing ulcers Pulmonary:   [] Uses home oxygen   [] Productive cough   [] Hemoptysis   [] Wheeze  [] COPD   [] Asthma Neurologic:  [] Dizziness  [] Blackouts   [] Seizures   [] History of stroke   [] History of TIA  [] Aphasia   [] Temporary blindness   [] Dysphagia   [] Weakness or numbness in arms   [] Weakness or numbness in legs Musculoskeletal:  [x] Arthritis   [] Joint swelling   [] Joint pain   [] Low back pain Hematologic:  [] Easy bruising  [] Easy bleeding   [] Hypercoagulable state   [] Anemic   Gastrointestinal:  [] Blood in stool   [] Vomiting blood  [] Gastroesophageal reflux/heartburn   [] Abdominal  pain Genitourinary:  [] Chronic kidney disease   [] Difficult urination  [] Frequent urination  [] Burning with urination   [] Hematuria Skin:  [] Rashes   [] Ulcers   [] Wounds Psychological:  [] History of anxiety   []  History of major depression.  Physical Examination  BP (!) 177/75 (BP Location: Right Arm)   Pulse 80   Resp 16   Wt (!) 313 lb 6.4 oz (142.2 kg)   BMI 42.50 kg/m  Gen:  WD/WN, NAD Head: [place]/AT, No temporalis wasting. Ear/Nose/Throat: Hearing  grossly intact, nares w/o erythema or drainage Eyes: Conjunctiva clear. Sclera non-icteric Neck: Supple.  Trachea midline Pulmonary:  Good air movement, no use of accessory muscles.  Cardiac: RRR, no JVD Vascular:  Vessel Right Left  Radial Palpable Palpable                   Musculoskeletal: M/S 5/5 throughout.  No deformity or atrophy.  1-2+ right lower extremity edema, 3+ left lower extremity edema. Neurologic: Sensation grossly intact in extremities.  Symmetrical.  Speech is fluent.  Psychiatric: Judgment intact, Mood & affect appropriate for pt's clinical situation. Dermatologic: No rashes or ulcers noted.  No cellulitis or open wounds.       Labs Recent Results (from the past 2160 hour(s))  CUP PACEART REMOTE DEVICE CHECK     Status: None   Collection Time: 03/25/20  3:04 AM  Result Value Ref Range   Date Time Interrogation Session [id number]    Pulse Generator Manufacturer MERM    Pulse Gen Model UXY33 Reveal LINQ    Pulse Gen Serial Number [id number]    Clinic Name [facility]    Implantable Pulse Generator Type ICM/ILR    Implantable Pulse Generator Implant Date [id number]   SARS CORONAVIRUS 2 (TAT 6-24 HRS) Nasopharyngeal Nasopharyngeal Swab     Status: None   Collection Time: 03/28/20 11:31 AM   Specimen: Nasopharyngeal Swab  Result Value Ref Range   SARS Coronavirus 2 NEGATIVE NEGATIVE    Comment: (NOTE) SARS-CoV-2 target nucleic acids are NOT DETECTED.  The SARS-CoV-2 RNA is generally detectable  in upper and lower respiratory specimens during the acute phase of infection. Negative results do not preclude SARS-CoV-2 infection, do not rule out co-infections with other pathogens, and should not be used as the sole basis for treatment or other patient management decisions. Negative results must be combined with clinical observations, patient history, and epidemiological information. The expected result is Negative.  Fact Sheet for Patients: SugarRoll.be  Fact Sheet for Healthcare Providers: https://www.woods-mathews.com/  This test is not yet approved or cleared by the [place] FDA and  has been authorized for detection and/or diagnosis of SARS-CoV-2 by FDA under an Emergency Use Authorization (EUA). This EUA will remain  in effect (meaning this test can be used) for the duration of the COVID-19 declaration under Se ction 564(b)(1) of the Act, 21 U.S.C. section 360bbb-3(b)(1), unless the authorization is terminated or revoked sooner.  Performed at [facility], [place] [address], [place], [place] [address]   Basic metabolic panel     Status: Abnormal   Collection Time: 03/28/20 12:02 PM  Result Value Ref Range   Glucose 131 (H) 65 - 99 mg/dL   BUN 31 (H) 6 - 24 mg/dL   Creatinine, Ser 2.49 (H) 0.76 - 1.27 mg/dL   GFR calc non Af Amer 29 (L) >59 mL/min/1.73   GFR calc Af Amer 34 (L) >59 mL/min/1.73    Comment: **[facility] currently reports eGFR in compliance with the current**   recommendations of the [employer]. [facility] will   update reporting as new guidelines are published from the NKF-ASN   Task force.    BUN/Creatinine Ratio 12 9 - 20   Sodium 145 (H) 134 - 144 mmol/L   Potassium 4.6 3.5 - 5.2 mmol/L   Chloride 111 (H) 96 - 106 mmol/L   CO2 24 20 - 29 mmol/L   Calcium 8.5 (L) 8.7 - 10.2 mg/dL  Lipid panel  Status: Abnormal   Collection Time: 03/28/20 12:02 PM  Result Value Ref Range    Cholesterol, Total 109 100 - 199 mg/dL   Triglycerides 89 0 - 149 mg/dL   HDL 31 (L) >39 mg/dL   VLDL Cholesterol Cal 18 5 - 40 mg/dL   LDL Chol Calc (NIH) 60 0 - 99 mg/dL   Chol/HDL Ratio 3.5 0.0 - 5.0 ratio    Comment:                                   T. Chol/HDL Ratio                                             Men  Women                               1/2 Avg.Risk  3.4    3.3                                   Avg.Risk  5.0    4.4                                2X Avg.Risk  9.6    7.1                                3X Avg.Risk 23.4   11.0   Glucose, capillary     Status: Abnormal   Collection Time: 03/30/20  9:44 AM  Result Value Ref Range   Glucose-Capillary 149 (H) 70 - 99 mg/dL    Comment: Glucose reference range applies only to samples taken after fasting for at least 8 hours.  Surgical pathology     Status: None   Collection Time: 03/30/20 11:02 AM  Result Value Ref Range   SURGICAL PATHOLOGY      SURGICAL PATHOLOGY CASE: [id number] PATIENT: [facility] Surgical Pathology Report     Specimen Submitted: A. Colon polyp, ascending; hot snare B. Colon polyp, ascending; hot snare C. Colon polyp, ascending; cbx D. Colon polyp, hepatic flexure; hot snare E. Colon polyp, descending; cold snare  Clinical History: CCA screening.  Colon polyp      DIAGNOSIS: A. COLON POLYP, ASCENDING; HOT SNARE: -TUBULOVILLOUS ADENOMA. - NEGATIVE FOR HIGH-GRADE DYSPLASIA AND MALIGNANCY.  B. COLON POLYP, ASCENDING; HOT SNARE: - TUBULAR ADENOMA. - NEGATIVE FOR HIGH-GRADE DYSPLASIA AND MALIGNANCY.  C. COLON POLYP, ASCENDING; COLD BIOPSY: - TUBULAR ADENOMA. - NEGATIVE FOR HIGH-GRADE DYSPLASIA AND MALIGNANCY.  D. COLON POLYP, HEPATIC FLEXURE; HOT SNARE: - TUBULAR ADENOMA. - NEGATIVE FOR HIGH-GRADE DYSPLASIA AND MALIGNANCY.  E. COLON POLYP, DESCENDING; COLD SNARE: - TUBULAR ADENOMA. - NEGATIVE FOR HIGH-GRADE DYSPLASIA AND MALIGNANCY.  GROSS DESCRIPTION: A. Labeled: Hot  snare  polyp ascending colon Received: Formalin Tissue fragment(s): Multiple Size: Aggregate, 1.5 x 0.9 x 0.3 cm Description: Tan soft tissue fragments with admixed fecal matter Entirely submitted in 1 cassette.  B. Labeled: Hot snare polyp ascending colon Received: Formalin Tissue fragment(s): Multiple Size: Aggregate, 0.9 x 0.6 x 0.3 cm Description: Tan soft  tissue fragments Entirely submitted in 1 cassette.  C. Labeled: cbx polyp descending colon Received: Formalin Tissue fragment(s): 1 Size: 0.5 cm Description: Tan soft tissue fragment Entirely submitted in 1 cassette.  D. Labeled: Hot snare polyp hepatic flexure Received: Formalin Tissue fragment(s): Multiple Size: Aggregate, 0.9 x 0.8 x 0.4 cm Description: Tan soft tissue fragments with admixed fecal matter Entirely submitted in 1 cassette.  E. Labeled: Cold snare polyp descending colon Received: Formalin Tissue fragment(s): Multiple Size: Aggregate, 0.8 x 0.8 x 0.3 cm Description: Tan soft tissue  fragments Entirely submitted in 1 cassette.   Final Diagnosis performed by Quay Burow, MD.   Electronically signed 03/31/2020 4:02:43PM The electronic signature indicates that the named Attending Pathologist has evaluated the specimen Technical component performed at [facility], [address], [place], [place] [address] Lab: [phone] Dir: Rush Farmer, MD, MMM  Professional component performed at [facility], [facility], [place], [place], [place] [address] Lab: [phone] Dir: Dellia Nims. Rubinas, MD   Basic metabolic panel     Status: Abnormal   Collection Time: 04/03/20  8:31 PM  Result Value Ref Range   Sodium 139 135 - 145 mmol/L   Potassium 4.3 3.5 - 5.1 mmol/L   Chloride 107 98 - 111 mmol/L   CO2 23 22 - 32 mmol/L   Glucose, Bld 118 (H) 70 - 99 mg/dL    Comment: Glucose reference range applies only to samples taken after fasting for at least 8 hours.   BUN 39 (H) 6 - 20 mg/dL   Creatinine,  Ser 4.08 (H) 0.61 - 1.24 mg/dL   Calcium 7.7 (L) 8.9 - 10.3 mg/dL   GFR calc non Af Amer 16 (L) >60 mL/min   GFR calc Af Amer 19 (L) >60 mL/min   Anion gap 9 5 - 15    Comment: Performed at [facility], [address] [place], [place] [address]  CBC     Status: Abnormal   Collection Time: 04/03/20  8:31 PM  Result Value Ref Range   WBC 5.2 4.0 - 10.5 K/uL   RBC 3.13 (L) 4.22 - 5.81 MIL/uL   Hemoglobin 9.2 (L) 13.0 - 17.0 g/dL   HCT 26.8 (L) 39 - 52 %   MCV 85.6 80.0 - 100.0 fL   MCH 29.4 26.0 - 34.0 pg   MCHC 34.3 30.0 - 36.0 g/dL   RDW 12.9 11.5 - 15.5 %   Platelets 142 (L) 150 - 400 K/uL   nRBC 0.0 0.0 - 0.2 %    Comment: Performed at [facility], [address] [place], [place] [address]  Troponin I (High Sensitivity)     Status: Abnormal   Collection Time: 04/03/20  8:31 PM  Result Value Ref Range   Troponin I (High Sensitivity) 150 (HH) <18 ng/L    Comment: CRITICAL RESULT CALLED TO, READ BACK BY AND VERIFIED WITH LISA THOMPSON $[email or web address]$  04/03/20 MJU (NOTE) Elevated high sensitivity troponin I (hsTnI) values and significant  changes across serial measurements may suggest ACS but many other  chronic and acute conditions are known to elevate hsTnI results.  Refer to the "Links" section for chest pain algorithms and additional  guidance. Performed at [facility], [address] [place], [place] [address]   Troponin I (High Sensitivity)     Status: Abnormal   Collection Time: 04/03/20 11:50 PM  Result Value Ref Range   Troponin I (High Sensitivity) 140 (HH) <18 ng/L    Comment: CRITICAL VALUE  NOTED. VALUE IS CONSISTENT WITH PREVIOUSLY REPORTED/CALLED VALUE RWW (NOTE) Elevated high sensitivity troponin I (hsTnI) values and significant  changes across serial measurements may suggest ACS but many other  chronic and acute conditions are known to elevate hsTnI results.  Refer to the "Links" section for chest pain algorithms and additional   guidance. Performed at [facility], [address] [place], [place] [address]   Blood Culture (routine x 2)     Status: None   Collection Time: 04/04/20 12:00 AM   Specimen: BLOOD  Result Value Ref Range   Specimen Description BLOOD LEFT ARM    Special Requests      BOTTLES DRAWN AEROBIC AND ANAEROBIC Blood Culture adequate volume   Culture      NO GROWTH 5 DAYS Performed at [facility], [address], [place], [place] [address]    Report Status 04/09/2020 FINAL   Lactic acid, plasma     Status: None   Collection Time: 04/04/20 12:05 AM  Result Value Ref Range   Lactic Acid, Venous 0.6 0.5 - 1.9 mmol/L    Comment: Performed at [facility], [address] [place], [place] [address]  Fibrin derivatives D-Dimer     Status: Abnormal   Collection Time: 04/04/20 12:05 AM  Result Value Ref Range   Fibrin derivatives D-dimer ([facility]) 1,114.93 (H) 0.00 - 499.00 ng/mL (FEU)    Comment: (NOTE) <> Exclusion of Venous Thromboembolism (VTE) - OUTPATIENT ONLY   (Emergency Department or [place])    0-499 ng/ml (FEU): With a low to intermediate pretest probability                      for VTE this test result excludes the diagnosis                      of VTE.   >499 ng/ml (FEU) : VTE not excluded; additional work up for VTE is                      required.  <> Testing on Inpatients and Evaluation of Disseminated Intravascular   Coagulation (DIC) Reference Range:   0-499 ng/ml (FEU) Performed at [facility], [address] [place], [place] [address]   Fibrinogen     Status: Abnormal   Collection Time: 04/04/20 12:05 AM  Result Value Ref Range   Fibrinogen 545 (H) 210 - 475 mg/dL    Comment: Performed at [facility], [address], [place], [place] [address]  Blood Culture (routine x 2)     Status: None   Collection Time: 04/04/20 12:48 AM   Specimen: BLOOD  Result Value Ref Range   Specimen Description BLOOD RIGHT AC     Special Requests      BOTTLES DRAWN AEROBIC AND ANAEROBIC Blood Culture adequate volume   Culture      NO GROWTH 5 DAYS Performed at [facility], [address], [place], [place] [address]    Report Status 04/09/2020 FINAL   C-reactive protein     Status: Abnormal   Collection Time: 04/04/20 12:48 AM  Result Value Ref Range   CRP 5.8 (H) <1.0 mg/dL    Comment: Performed at [facility], [place] [address] [place], [place] [address]  HIV Antibody (routine testing w rflx)     Status: None   Collection Time: 04/04/20 12:48 AM  Result Value Ref Range   HIV Screen  4th Generation wRfx Non Reactive Non Reactive    Comment: Performed at [facility], [place] [address], [place], [place] [address]  Glucose, capillary     Status: Abnormal   Collection Time: 04/04/20  1:38 AM  Result Value Ref Range   Glucose-Capillary 120 (H) 70 - 99 mg/dL    Comment: Glucose reference range applies only to samples taken after fasting for at least 8 hours.  Lactic acid, plasma     Status: None   Collection Time: 04/04/20  3:41 AM  Result Value Ref Range   Lactic Acid, Venous 0.6 0.5 - 1.9 mmol/L    Comment: Performed at [facility], [address] [place], [place] [address]  Ferritin     Status: Abnormal   Collection Time: 04/04/20  3:41 AM  Result Value Ref Range   Ferritin 534 (H) 24 - 336 ng/mL    Comment: Performed at [facility], [address] [place], [place] [address]  Lactate dehydrogenase     Status: Abnormal   Collection Time: 04/04/20  3:41 AM  Result Value Ref Range   LDH 257 (H) 98 - 192 U/L    Comment: Performed at [facility], [address] [place], [place] [address]  Procalcitonin     Status: None   Collection Time: 04/04/20  3:41 AM  Result Value Ref Range   Procalcitonin 0.19 ng/mL    Comment:        Interpretation: PCT (Procalcitonin) <= 0.5 ng/mL: Systemic infection (sepsis) is not likely. Local bacterial infection is  possible. (NOTE)       Sepsis PCT Algorithm           Lower Respiratory Tract                                      Infection PCT Algorithm    ----------------------------     ----------------------------         PCT < 0.25 ng/mL                PCT < 0.10 ng/mL          Strongly encourage             Strongly discourage   discontinuation of antibiotics    initiation of antibiotics    ----------------------------     -----------------------------       PCT 0.25 - 0.50 ng/mL            PCT 0.10 - 0.25 ng/mL               OR       >80% decrease in PCT            Discourage initiation of                                            antibiotics      Encourage discontinuation           of antibiotics    ----------------------------     -----------------------------         PCT >= 0.50 ng/mL              PCT 0.26 - 0.50 ng/mL  AND        <80% decrease in PCT             Encourage initiation of                                             antibiotics       Encourage continuation           of antibiotics    ----------------------------     -----------------------------        PCT >= 0.50 ng/mL                  PCT > 0.50 ng/mL               AND         increase in PCT                  Strongly encourage                                      initiation of antibiotics    Strongly encourage escalation           of antibiotics                                     -----------------------------                                           PCT <= 0.25 ng/mL                                                 OR                                        > 80% decrease in PCT                                      Discontinue / Do not initiate                                             antibiotics  Performed at [facility], [address] [place], [place] [address]   Triglycerides     Status: None   Collection Time: 04/04/20  3:41 AM  Result Value Ref Range   Triglycerides 93 <150  mg/dL    Comment: Performed at [facility], [address] [place], [place] [address]  ABO/Rh     Status: None   Collection Time: 04/04/20  3:41 AM  Result Value Ref Range   ABO/RH(D)      O POS Performed at [facility], [address], [place], [place] [address]  Comprehensive metabolic panel     Status: Abnormal   Collection Time: 04/04/20  3:41 AM  Result Value Ref Range   Sodium 138 135 - 145 mmol/L   Potassium 3.9 3.5 - 5.1 mmol/L   Chloride 107 98 - 111 mmol/L   CO2 20 (L) 22 - 32 mmol/L   Glucose, Bld 160 (H) 70 - 99 mg/dL    Comment: Glucose reference range applies only to samples taken after fasting for at least 8 hours.   BUN 38 (H) 6 - 20 mg/dL   Creatinine, Ser 3.69 (H) 0.61 - 1.24 mg/dL   Calcium 7.4 (L) 8.9 - 10.3 mg/dL   Total Protein 5.8 (L) 6.5 - 8.1 g/dL   Albumin 2.5 (L) 3.5 - 5.0 g/dL   AST 36 15 - 41 U/L   ALT 17 0 - 44 U/L   Alkaline Phosphatase 46 38 - 126 U/L   Total Bilirubin 0.8 0.3 - 1.2 mg/dL   GFR calc non Af Amer 18 (L) >60 mL/min   GFR calc Af Amer 21 (L) >60 mL/min   Anion gap 11 5 - 15    Comment: Performed at [facility], [address] [place], [place] [address]  Procalcitonin - Baseline     Status: None   Collection Time: 04/04/20  3:41 AM  Result Value Ref Range   Procalcitonin 0.17 ng/mL    Comment:        Interpretation: PCT (Procalcitonin) <= 0.5 ng/mL: Systemic infection (sepsis) is not likely. Local bacterial infection is possible. (NOTE)       Sepsis PCT Algorithm           Lower Respiratory Tract                                      Infection PCT Algorithm    ----------------------------     ----------------------------         PCT < 0.25 ng/mL                PCT < 0.10 ng/mL          Strongly encourage             Strongly discourage   discontinuation of antibiotics    initiation of antibiotics    ----------------------------     -----------------------------       PCT 0.25 - 0.50 ng/mL             PCT 0.10 - 0.25 ng/mL               OR       >80% decrease in PCT            Discourage initiation of                                            antibiotics      Encourage discontinuation           of antibiotics    ----------------------------     -----------------------------         PCT >= 0.50 ng/mL              PCT 0.26 - 0.50 ng/mL               AND        <  80% decrease in PCT             Encourage initiation of                                             antibiotics       Encourage continuation           of antibiotics    ----------------------------     -----------------------------        PCT >= 0.50 ng/mL                  PCT > 0.50 ng/mL               AND         increase in PCT                  Strongly encourage                                      initiation of antibiotics    Strongly encourage escalation           of antibiotics                                     -----------------------------                                           PCT <= 0.25 ng/mL                                                 OR                                        > 80% decrease in PCT                                      Discontinue / Do not initiate                                             antibiotics  Performed at [facility], [address] [place], [place] [address]   Hemoglobin A1c     Status: Abnormal   Collection Time: 04/04/20  3:41 AM  Result Value Ref Range   Hgb A1c MFr Bld 6.7 (H) 4.8 - 5.6 %    Comment: (NOTE)         Prediabetes: 5.7 - 6.4         Diabetes: >6.4         Glycemic control for adults with diabetes: <7.0    Mean Plasma Glucose 146 mg/dL    Comment: (NOTE) Performed At: [place]  [place], [place] [id number] Rush Farmer MD [id number]   Glucose, capillary     Status: Abnormal   Collection Time: 04/04/20  8:11 AM  Result Value Ref Range   Glucose-Capillary 175 (H) 70 - 99 mg/dL    Comment: Glucose  reference range applies only to samples taken after fasting for at least 8 hours.  Glucose, capillary     Status: Abnormal   Collection Time: 04/04/20 12:21 PM  Result Value Ref Range   Glucose-Capillary 242 (H) 70 - 99 mg/dL    Comment: Glucose reference range applies only to samples taken after fasting for at least 8 hours.  Glucose, capillary     Status: Abnormal   Collection Time: 04/04/20  3:53 PM  Result Value Ref Range   Glucose-Capillary 243 (H) 70 - 99 mg/dL    Comment: Glucose reference range applies only to samples taken after fasting for at least 8 hours.  Glucose, capillary     Status: Abnormal   Collection Time: 04/04/20  7:37 PM  Result Value Ref Range   Glucose-Capillary 218 (H) 70 - 99 mg/dL    Comment: Glucose reference range applies only to samples taken after fasting for at least 8 hours.  CBC with Differential/Platelet     Status: Abnormal   Collection Time: 04/05/20  4:33 AM  Result Value Ref Range   WBC 3.8 (L) 4.0 - 10.5 K/uL   RBC 3.36 (L) 4.22 - 5.81 MIL/uL   Hemoglobin 9.7 (L) 13.0 - 17.0 g/dL   HCT 27.8 (L) 39 - 52 %   MCV 82.7 80.0 - 100.0 fL   MCH 28.9 26.0 - 34.0 pg   MCHC 34.9 30.0 - 36.0 g/dL   RDW 12.3 11.5 - 15.5 %   Platelets 154 150 - 400 K/uL   nRBC 0.0 0.0 - 0.2 %   Neutrophils Relative % 71 %   Neutro Abs 2.7 1.7 - 7.7 K/uL   Lymphocytes Relative 17 %   Lymphs Abs 0.6 (L) 0.7 - 4.0 K/uL   Monocytes Relative 11 %   Monocytes Absolute 0.4 0.1 - 1.0 K/uL   Eosinophils Relative 0 %   Eosinophils Absolute 0.0 0 - 0 K/uL   Basophils Relative 0 %   Basophils Absolute 0.0 0 - 0 K/uL   Immature Granulocytes 1 %   Abs Immature Granulocytes 0.02 0.00 - 0.07 K/uL    Comment: Performed at [facility], [address] [place], [place] [address]  Comprehensive metabolic panel     Status: Abnormal   Collection Time: 04/05/20  4:33 AM  Result Value Ref Range   Sodium 139 135 - 145 mmol/L   Potassium 4.2 3.5 - 5.1 mmol/L   Chloride 109  98 - 111 mmol/L   CO2 22 22 - 32 mmol/L   Glucose, Bld 195 (H) 70 - 99 mg/dL    Comment: Glucose reference range applies only to samples taken after fasting for at least 8 hours.   BUN 47 (H) 6 - 20 mg/dL   Creatinine, Ser 3.33 (H) 0.61 - 1.24 mg/dL   Calcium 7.6 (L) 8.9 - 10.3 mg/dL   Total Protein 6.0 (L) 6.5 - 8.1 g/dL   Albumin 2.5 (L) 3.5 - 5.0 g/dL   AST 32 15 - 41 U/L   ALT 17 0 - 44 U/L   Alkaline Phosphatase 51 38 - 126 U/L   Total Bilirubin 0.5 0.3 - 1.2 mg/dL   GFR calc non Af Amer 21 (  L) >60 mL/min   GFR calc Af Amer 24 (L) >60 mL/min   Anion gap 8 5 - 15    Comment: Performed at [facility], [address] [place], [place] [address]  C-reactive protein     Status: Abnormal   Collection Time: 04/05/20  4:33 AM  Result Value Ref Range   CRP 4.7 (H) <1.0 mg/dL    Comment: Performed at [facility], [place] [address] [place], [place] [address]  Fibrin derivatives D-Dimer [facility] only)     Status: Abnormal   Collection Time: 04/05/20  4:33 AM  Result Value Ref Range   Fibrin derivatives D-dimer ([facility]) 878.22 (H) 0.00 - 499.00 ng/mL (FEU)    Comment: (NOTE) <> Exclusion of Venous Thromboembolism (VTE) - OUTPATIENT ONLY   (Emergency Department or [place])    0-499 ng/ml (FEU): With a low to intermediate pretest probability                      for VTE this test result excludes the diagnosis                      of VTE.   >499 ng/ml (FEU) : VTE not excluded; additional work up for VTE is                      required.  <> Testing on Inpatients and Evaluation of Disseminated Intravascular   Coagulation (DIC) Reference Range:   0-499 ng/ml (FEU) Performed at [facility], [address] [place], [place] [address]   Ferritin     Status: Abnormal   Collection Time: 04/05/20  4:33 AM  Result Value Ref Range   Ferritin 632 (H) 24 - 336 ng/mL    Comment: Performed at [facility], [address] [place], [place] [address]  Magnesium      Status: None   Collection Time: 04/05/20  4:33 AM  Result Value Ref Range   Magnesium 2.3 1.7 - 2.4 mg/dL    Comment: Performed at [facility], [address] [place], [place] [address]  Phosphorus     Status: None   Collection Time: 04/05/20  4:33 AM  Result Value Ref Range   Phosphorus 4.5 2.5 - 4.6 mg/dL    Comment: Performed at [facility], [address] [place], [place] [address]  Procalcitonin     Status: None   Collection Time: 04/05/20  4:33 AM  Result Value Ref Range   Procalcitonin 0.17 ng/mL    Comment:        Interpretation: PCT (Procalcitonin) <= 0.5 ng/mL: Systemic infection (sepsis) is not likely. Local bacterial infection is possible. (NOTE)       Sepsis PCT Algorithm           Lower Respiratory Tract                                      Infection PCT Algorithm    ----------------------------     ----------------------------         PCT < 0.25 ng/mL                PCT < 0.10 ng/mL          Strongly encourage             Strongly discourage   discontinuation of  antibiotics    initiation of antibiotics    ----------------------------     -----------------------------       PCT 0.25 - 0.50 ng/mL            PCT 0.10 - 0.25 ng/mL               OR       >80% decrease in PCT            Discourage initiation of                                            antibiotics      Encourage discontinuation           of antibiotics    ----------------------------     -----------------------------         PCT >= 0.50 ng/mL              PCT 0.26 - 0.50 ng/mL               AND        <80% decrease in PCT             Encourage initiation of                                             antibiotics       Encourage continuation           of antibiotics    ----------------------------     -----------------------------        PCT >= 0.50 ng/mL                  PCT > 0.50 ng/mL               AND         increase in PCT                  Strongly encourage                                       initiation of antibiotics    Strongly encourage escalation           of antibiotics                                     -----------------------------                                           PCT <= 0.25 ng/mL                                                 OR                                        >  80% decrease in PCT                                      Discontinue / Do not initiate                                             antibiotics  Performed at [facility], [address] [place], [place] [address]   Glucose, capillary     Status: Abnormal   Collection Time: 04/05/20  8:02 AM  Result Value Ref Range   Glucose-Capillary 184 (H) 70 - 99 mg/dL    Comment: Glucose reference range applies only to samples taken after fasting for at least 8 hours.   Comment 1 Notify RN   Glucose, capillary     Status: Abnormal   Collection Time: 04/05/20 11:14 AM  Result Value Ref Range   Glucose-Capillary 203 (H) 70 - 99 mg/dL    Comment: Glucose reference range applies only to samples taken after fasting for at least 8 hours.   Comment 1 Notify RN   Glucose, capillary     Status: Abnormal   Collection Time: 04/05/20  4:43 PM  Result Value Ref Range   Glucose-Capillary 211 (H) 70 - 99 mg/dL    Comment: Glucose reference range applies only to samples taken after fasting for at least 8 hours.   Comment 1 Notify RN   Glucose, capillary     Status: Abnormal   Collection Time: 04/05/20  9:37 PM  Result Value Ref Range   Glucose-Capillary 187 (H) 70 - 99 mg/dL    Comment: Glucose reference range applies only to samples taken after fasting for at least 8 hours.  CBC with Differential/Platelet     Status: Abnormal   Collection Time: 04/06/20  3:35 AM  Result Value Ref Range   WBC 5.8 4.0 - 10.5 K/uL   RBC 3.27 (L) 4.22 - 5.81 MIL/uL   Hemoglobin 9.6 (L) 13.0 - 17.0 g/dL   HCT 28.1 (L) 39 - 52 %   MCV 85.9 80.0 - 100.0 fL   MCH 29.4 26.0 - 34.0 pg    MCHC 34.2 30.0 - 36.0 g/dL   RDW 12.6 11.5 - 15.5 %   Platelets 178 150 - 400 K/uL   nRBC 0.0 0.0 - 0.2 %   Neutrophils Relative % 80 %   Neutro Abs 4.6 1.7 - 7.7 K/uL   Lymphocytes Relative 12 %   Lymphs Abs 0.7 0.7 - 4.0 K/uL   Monocytes Relative 7 %   Monocytes Absolute 0.4 0.1 - 1.0 K/uL   Eosinophils Relative 0 %   Eosinophils Absolute 0.0 0 - 0 K/uL   Basophils Relative 0 %   Basophils Absolute 0.0 0 - 0 K/uL   Immature Granulocytes 1 %   Abs Immature Granulocytes 0.04 0.00 - 0.07 K/uL    Comment: Performed at [facility], [address], [place], [place] [address]  Comprehensive metabolic panel     Status: Abnormal   Collection Time: 04/06/20  3:35 AM  Result Value Ref Range   Sodium 139 135 - 145 mmol/L   Potassium 4.1 3.5 - 5.1 mmol/L   Chloride 109 98 - 111 mmol/L   CO2 22 22 - 32 mmol/L   Glucose, Bld 118 (  H) 70 - 99 mg/dL    Comment: Glucose reference range applies only to samples taken after fasting for at least 8 hours.   BUN 55 (H) 6 - 20 mg/dL   Creatinine, Ser 3.04 (H) 0.61 - 1.24 mg/dL   Calcium 7.5 (L) 8.9 - 10.3 mg/dL   Total Protein 5.7 (L) 6.5 - 8.1 g/dL   Albumin 2.4 (L) 3.5 - 5.0 g/dL   AST 27 15 - 41 U/L   ALT 16 0 - 44 U/L   Alkaline Phosphatase 44 38 - 126 U/L   Total Bilirubin 0.6 0.3 - 1.2 mg/dL   GFR calc non Af Amer 23 (L) >60 mL/min   GFR calc Af Amer 27 (L) >60 mL/min   Anion gap 8 5 - 15    Comment: Performed at [facility], [address] [place], [place] [address]  C-reactive protein     Status: Abnormal   Collection Time: 04/06/20  3:35 AM  Result Value Ref Range   CRP 2.5 (H) <1.0 mg/dL    Comment: Performed at [facility] [place] [address] [place], [place] [address]  Fibrin derivatives D-Dimer [facility] only)     Status: Abnormal   Collection Time: 04/06/20  3:35 AM  Result Value Ref Range   Fibrin derivatives D-dimer ([facility]) 723.30 (H) 0.00 - 499.00 ng/mL (FEU)    Comment: (NOTE) <> Exclusion of Venous  Thromboembolism (VTE) - OUTPATIENT ONLY   (Emergency Department or [place])    0-499 ng/ml (FEU): With a low to intermediate pretest probability                      for VTE this test result excludes the diagnosis                      of VTE.   >499 ng/ml (FEU) : VTE not excluded; additional work up for VTE is                      required.  <> Testing on Inpatients and Evaluation of Disseminated Intravascular   Coagulation (DIC) Reference Range:   0-499 ng/ml (FEU) Performed at [name], [address] [place], [place] [address]   Ferritin     Status: Abnormal   Collection Time: 04/06/20  3:35 AM  Result Value Ref Range   Ferritin 503 (H) 24 - 336 ng/mL    Comment: Performed at [facility], [address], [place], [place] [address]  Magnesium     Status: None   Collection Time: 04/06/20  3:35 AM  Result Value Ref Range   Magnesium 2.3 1.7 - 2.4 mg/dL    Comment: Performed at [facility], [address], [place], [place] [address]  Phosphorus     Status: None   Collection Time: 04/06/20  3:35 AM  Result Value Ref Range   Phosphorus 4.3 2.5 - 4.6 mg/dL    Comment: Performed at [facility], [address] [place], [place] [address]  Procalcitonin     Status: None   Collection Time: 04/06/20  3:35 AM  Result Value Ref Range   Procalcitonin 0.16 ng/mL    Comment:        Interpretation: PCT (Procalcitonin) <= 0.5 ng/mL: Systemic infection (sepsis) is not likely. Local bacterial infection is possible. (NOTE)       Sepsis PCT Algorithm  Lower Respiratory Tract                                      Infection PCT Algorithm    ----------------------------     ----------------------------         PCT < 0.25 ng/mL                PCT < 0.10 ng/mL          Strongly encourage             Strongly discourage   discontinuation of antibiotics    initiation of antibiotics    ----------------------------      -----------------------------       PCT 0.25 - 0.50 ng/mL            PCT 0.10 - 0.25 ng/mL               OR       >80% decrease in PCT            Discourage initiation of                                            antibiotics      Encourage discontinuation           of antibiotics    ----------------------------     -----------------------------         PCT >= 0.50 ng/mL              PCT 0.26 - 0.50 ng/mL               AND        <80% decrease in PCT             Encourage initiation of                                             antibiotics       Encourage continuation           of antibiotics    ----------------------------     -----------------------------        PCT >= 0.50 ng/mL                  PCT > 0.50 ng/mL               AND         increase in PCT                  Strongly encourage                                      initiation of antibiotics    Strongly encourage escalation           of antibiotics                                     -----------------------------  PCT <= 0.25 ng/mL                                                 OR                                        > 80% decrease in PCT                                      Discontinue / Do not initiate                                             antibiotics  Performed at [facility], [address] [place],  [address]   Glucose, capillary     Status: Abnormal   Collection Time: 04/06/20  9:06 AM  Result Value Ref Range   Glucose-Capillary 137 (H) 70 - 99 mg/dL    Comment: Glucose reference range applies only to samples taken after fasting for at least 8 hours.  Glucose, capillary     Status: Abnormal   Collection Time: 04/06/20 12:16 PM  Result Value Ref Range   Glucose-Capillary 155 (H) 70 - 99 mg/dL    Comment: Glucose reference range applies only to samples taken after fasting for at least 8 hours.  Glucose, capillary     Status: Abnormal    Collection Time: 04/06/20  4:43 PM  Result Value Ref Range   Glucose-Capillary 180 (H) 70 - 99 mg/dL    Comment: Glucose reference range applies only to samples taken after fasting for at least 8 hours.  Glucose, capillary     Status: Abnormal   Collection Time: 04/06/20  8:02 PM  Result Value Ref Range   Glucose-Capillary 220 (H) 70 - 99 mg/dL    Comment: Glucose reference range applies only to samples taken after fasting for at least 8 hours.  CBC with Differential/Platelet     Status: Abnormal   Collection Time: 04/07/20  4:57 AM  Result Value Ref Range   WBC 8.6 4.0 - 10.5 K/uL   RBC 3.37 (L) 4.22 - 5.81 MIL/uL   Hemoglobin 9.8 (L) 13.0 - 17.0 g/dL   HCT 29.0 (L) 39 - 52 %   MCV 86.1 80.0 - 100.0 fL   MCH 29.1 26.0 - 34.0 pg   MCHC 33.8 30.0 - 36.0 g/dL   RDW 12.7 11.5 - 15.5 %   Platelets 206 150 - 400 K/uL   nRBC 0.0 0.0 - 0.2 %   Neutrophils Relative % 80 %   Neutro Abs 7.0 1.7 - 7.7 K/uL   Lymphocytes Relative 11 %   Lymphs Abs 0.9 0.7 - 4.0 K/uL   Monocytes Relative 8 %   Monocytes Absolute 0.7 0.1 - 1.0 K/uL   Eosinophils Relative 0 %   Eosinophils Absolute 0.0 0 - 0 K/uL   Basophils Relative 0 %   Basophils Absolute 0.0 0 - 0 K/uL   Immature Granulocytes 1 %   Abs Immature Granulocytes 0.06 0.00 - 0.07 K/uL    Comment:  Performed at [facility], [address] [place], [place] [address]  Comprehensive metabolic panel     Status: Abnormal   Collection Time: 04/07/20  4:57 AM  Result Value Ref Range   Sodium 141 135 - 145 mmol/L   Potassium 4.1 3.5 - 5.1 mmol/L   Chloride 110 98 - 111 mmol/L   CO2 23 22 - 32 mmol/L   Glucose, Bld 150 (H) 70 - 99 mg/dL    Comment: Glucose reference range applies only to samples taken after fasting for at least 8 hours.   BUN 58 (H) 6 - 20 mg/dL   Creatinine, Ser 8.88 (H) 0.61 - 1.24 mg/dL   Calcium 7.6 (L) 8.9 - 10.3 mg/dL   Total Protein 5.5 (L) 6.5 - 8.1 g/dL   Albumin 2.4 (L) 3.5 - 5.0 g/dL   AST 24 15 - 41 U/L    ALT 15 0 - 44 U/L   Alkaline Phosphatase 46 38 - 126 U/L   Total Bilirubin 0.6 0.3 - 1.2 mg/dL   GFR calc non Af Amer 24 (L) >60 mL/min   GFR calc Af Amer 28 (L) >60 mL/min   Anion gap 8 5 - 15    Comment: Performed at [facility], [name] [place], [place] [address]  C-reactive protein     Status: Abnormal   Collection Time: 04/07/20  4:57 AM  Result Value Ref Range   CRP 1.6 (H) <1.0 mg/dL    Comment: Performed at [facility], [address], [place], [place] [address]  Fibrin derivatives D-Dimer [facility] only)     Status: Abnormal   Collection Time: 04/07/20  4:57 AM  Result Value Ref Range   Fibrin derivatives D-dimer ([facility]) 530.16 (H) 0.00 - 499.00 ng/mL (FEU)    Comment: (NOTE) <> Exclusion of Venous Thromboembolism (VTE) - OUTPATIENT ONLY   (Emergency Department or [place])    0-499 ng/ml (FEU): With a low to intermediate pretest probability                      for VTE this test result excludes the diagnosis                      of VTE.   >499 ng/ml (FEU) : VTE not excluded; additional work up for VTE is                      required.  <> Testing on Inpatients and Evaluation of Disseminated Intravascular   Coagulation (DIC) Reference Range:   0-499 ng/ml (FEU) Performed at [facility], [address] [place], [place] [address]   Ferritin     Status: Abnormal   Collection Time: 04/07/20  4:57 AM  Result Value Ref Range   Ferritin 488 (H) 24 - 336 ng/mL    Comment: Performed at [facility], [address] [place], [place] [address]  Magnesium     Status: None   Collection Time: 04/07/20  4:57 AM  Result Value Ref Range   Magnesium 2.3 1.7 - 2.4 mg/dL    Comment: Performed at [facility], [address], [place], [place] [address]  Phosphorus     Status: None   Collection Time: 04/07/20  4:57 AM  Result Value Ref Range   Phosphorus 4.1 2.5 - 4.6 mg/dL    Comment: Performed at [facility], [address], [place], [place] [address]  Glucose, capillary     Status: Abnormal   Collection Time: 04/07/20  8:45 AM  Result Value Ref Range   Glucose-Capillary 124 (H) 70 - 99 mg/dL    Comment: Glucose reference range applies only to samples taken after fasting for at least 8 hours.  CBC with Differential/Platelet     Status: Abnormal   Collection Time: 04/24/20  3:13 PM  Result Value Ref Range   WBC 5.2 3.4 - 10.8 x10E3/uL   RBC 2.98 (L) 4.14 - 5.80 x10E6/uL   Hemoglobin 8.5 (L) 13.0 - 17.7 g/dL   Hematocrit 22.9 (L) 79.8 - 51.0 %   MCV 86 79 - 97 fL   MCH 28.5 26.6 - 33.0 pg   MCHC 33.1 31 - 35 g/dL   RDW 92.1 19.4 - 17.4 %   Platelets 133 (L) 150 - 450 x10E3/uL   Neutrophils 68 Not Estab. %   Lymphs 21 Not Estab. %   Monocytes 9 Not Estab. %   Eos 1 Not Estab. %   Basos 1 Not Estab. %   Neutrophils Absolute 3.6 1 - 7 x10E3/uL   Lymphocytes Absolute 1.1 0 - 3 x10E3/uL   Monocytes Absolute 0.5 0 - 0 x10E3/uL   EOS (ABSOLUTE) 0.1 0.0 - 0.4 x10E3/uL   Basophils Absolute 0.0 0 - 0 x10E3/uL   Immature Granulocytes 0 Not Estab. %   Immature Grans (Abs) 0.0 0.0 - 0.1 x10E3/uL  Basic metabolic panel     Status: Abnormal   Collection Time: 04/24/20  3:13 PM  Result Value Ref Range   Glucose 93 65 - 99 mg/dL   BUN 35 (H) 6 - 24 mg/dL   Creatinine, Ser 0.81 (H) 0.76 - 1.27 mg/dL   GFR calc non Af Amer 30 (L) >59 mL/min/1.73   GFR calc Af Amer 35 (L) >59 mL/min/1.73    Comment: **[facility] currently reports eGFR in compliance with the current**   recommendations of the [employer]. [facility] will   update reporting as new guidelines are published from the NKF-ASN   Task force.    BUN/Creatinine Ratio 15 9 - 20   Sodium 144 134 - 144 mmol/L   Potassium 5.7 (H) 3.5 - 5.2 mmol/L   Chloride 113 (H) 96 - 106 mmol/L   CO2 20 20 - 29 mmol/L   Calcium 8.9 8.7 - 10.2 mg/dL  Iron and TIBC     Status: Abnormal   Collection Time: 04/24/20  3:13 PM  Result Value Ref Range    Total Iron Binding Capacity 222 (L) 250 - 450 ug/dL   UIBC 448 185 - 631 ug/dL   Iron 43 38 - 497 ug/dL   Iron Saturation 19 15 - 55 %  Potassium     Status: None   Collection Time: 04/25/20 12:15 PM  Result Value Ref Range   Potassium CANCELED mmol/L    Comment: Test not performed. Test cancelled by Healthcare provider after order was submitted to [facility]  Result canceled by the ancillary.   Potassium     Status: Abnormal   Collection Time: 04/25/20  3:26 PM  Result Value Ref Range   Potassium 5.4 (H) 3.5 - 5.1 mEq/L  CUP PACEART REMOTE DEVICE CHECK     Status: None   Collection Time: 04/27/20  3:06 AM  Result Value Ref Range   Date Time Interrogation Session [id number]    Pulse Generator Manufacturer [employer]    Pulse Gen Model [id number] Reveal [id number]    Pulse  Gen Serial Number [id number] [facility] Name [place] Pulse Generator Type ICM/ILR    Implantable Pulse Generator Implant Date [id number]   Potassium     Status: None   Collection Time: 05/08/20  9:56 AM  Result Value Ref Range   Potassium 4.5 3.5 - 5.1 mmol/L    Comment: Performed at [facility], [address] [place], [place] [address]  Vitamin B12     Status: None   Collection Time: 05/16/20 12:50 PM  Result Value Ref Range   Vitamin B-12 226 211 - 911 pg/mL  IBC + Ferritin     Status: Abnormal   Collection Time: 05/16/20 12:50 PM  Result Value Ref Range   Iron 49 42 - 165 ug/dL   Transferrin 194.0 (L) 212.0 - 360.0 mg/dL   Saturation Ratios 18.0 (L) 20.0 - 50.0 %   Ferritin 199.5 22.0 - 322.0 ng/mL  Basic metabolic panel     Status: Abnormal   Collection Time: 05/16/20 12:50 PM  Result Value Ref Range   Sodium 142 135 - 145 mEq/L   Potassium 4.1 3.5 - 5.1 mEq/L   Chloride 111 96 - 112 mEq/L   CO2 24 19 - 32 mEq/L   Glucose, Bld 122 (H) 70 - 99 mg/dL   BUN 34 (H) 6 - 23 mg/dL   Creatinine, Ser 2.45 (H) 0.40 - 1.50 mg/dL   GFR 28.15 (L) >60.00 mL/min   Calcium 8.4 8.4 - 10.5  mg/dL  TSH     Status: Abnormal   Collection Time: 05/16/20 12:50 PM  Result Value Ref Range   TSH 15.38 (H) 0.35 - 4.50 uIU/mL  Hepatic function panel     Status: None   Collection Time: 05/16/20 12:50 PM  Result Value Ref Range   Total Bilirubin 0.3 0.2 - 1.2 mg/dL   Bilirubin, Direct 0.1 0.0 - 0.3 mg/dL   Alkaline Phosphatase 77 39 - 117 U/L   AST 22 0 - 37 U/L   ALT 14 0 - 53 U/L   Total Protein 6.1 6.0 - 8.3 g/dL   Albumin 3.6 3.5 - 5.2 g/dL  CBC with Differential/Platelet     Status: Abnormal   Collection Time: 05/16/20 12:50 PM  Result Value Ref Range   WBC 5.1 4.0 - 10.5 K/uL   RBC 3.07 (L) 4.22 - 5.81 Mil/uL   Hemoglobin 9.2 Repeated and verified X2. (L) 13.0 - 17.0 g/dL   HCT 27.2 (L) 39 - 52 %   MCV 88.6 78.0 - 100.0 fl   MCHC 33.8 30.0 - 36.0 g/dL   RDW 13.9 11.5 - 15.5 %   Platelets 167.0 150 - 400 K/uL   Neutrophils Relative % 69.5 43 - 77 %   Lymphocytes Relative 19.7 12 - 46 %   Monocytes Relative 8.5 3 - 12 %   Eosinophils Relative 1.6 0 - 5 %   Basophils Relative 0.7 0 - 3 %   Neutro Abs 3.6 1.4 - 7.7 K/uL   Lymphs Abs 1.0 0.7 - 4.0 K/uL   Monocytes Absolute 0.4 0.1 - 1.0 K/uL   Eosinophils Absolute 0.1 0 - 0 K/uL   Basophils Absolute 0.0 0 - 0 K/uL  CUP PACEART REMOTE DEVICE CHECK     Status: None   Collection Time: 05/30/20  3:09 AM  Result Value Ref Range   Date Time Interrogation Session [id number]    Pulse Generator Manufacturer [employer]    Pulse Gen Model  [id number] Reveal LINQ    Pulse Gen Serial Number [id number]    Clinic Name [facility] Pulse Generator Type ICM/ILR    Implantable Pulse Generator Implant Date [id number]     Radiology CUP PACEART REMOTE DEVICE CHECK  Result Date: 06/02/2020 ILR summary report received. Battery status OK. Normal device function. No new symptom episodes, tachy episodes, brady, or pause episodes. No new AF episodes. Monthly summary reports and ROV/PRN   Assessment/Plan  Essential  hypertension blood pressure control important in reducing the progression of atherosclerotic disease. On appropriate oral medications.   CKD (chronic kidney disease) stage 4, GFR 15-29 ml/min (HCC) This is a major contributing factor to his lower extremity swelling  Obesity, Class III, BMI 40-49.9 (morbid obesity) (HCC) Weight loss would be of significant benefit to reduce his lower extremity swelling  Lymphedema The patient's lymphedema continues to progress despite appropriate conservative measures.  He clearly needs a lymphedema pump as an adjuvant therapy to try to improve his symptoms.  We will try to get that obtained today.  He will follow up in 6 months.    Leotis Pain, MD  06/02/2020 1:21 PM    This note was created with Dragon medical transcription system.  Any errors from dictation are purely unintentional

## 2020-06-02 NOTE — Assessment & Plan Note (Signed)
This is a major contributing factor to his lower extremity swelling. 

## 2020-06-05 ENCOUNTER — Ambulatory Visit (INDEPENDENT_AMBULATORY_CARE_PROVIDER_SITE_OTHER): Payer: Managed Care, Other (non HMO)

## 2020-06-05 DIAGNOSIS — Z8673 Personal history of transient ischemic attack (TIA), and cerebral infarction without residual deficits: Secondary | ICD-10-CM | POA: Diagnosis not present

## 2020-06-06 ENCOUNTER — Encounter: Payer: Self-pay | Admitting: Internal Medicine

## 2020-06-06 ENCOUNTER — Ambulatory Visit (INDEPENDENT_AMBULATORY_CARE_PROVIDER_SITE_OTHER): Payer: Managed Care, Other (non HMO) | Admitting: Internal Medicine

## 2020-06-06 ENCOUNTER — Other Ambulatory Visit: Payer: Self-pay

## 2020-06-06 DIAGNOSIS — J1282 Pneumonia due to coronavirus disease 2019: Secondary | ICD-10-CM

## 2020-06-06 DIAGNOSIS — G629 Polyneuropathy, unspecified: Secondary | ICD-10-CM

## 2020-06-06 DIAGNOSIS — R531 Weakness: Secondary | ICD-10-CM

## 2020-06-06 DIAGNOSIS — M7989 Other specified soft tissue disorders: Secondary | ICD-10-CM

## 2020-06-06 DIAGNOSIS — Z794 Long term (current) use of insulin: Secondary | ICD-10-CM

## 2020-06-06 DIAGNOSIS — E785 Hyperlipidemia, unspecified: Secondary | ICD-10-CM

## 2020-06-06 DIAGNOSIS — N184 Chronic kidney disease, stage 4 (severe): Secondary | ICD-10-CM

## 2020-06-06 DIAGNOSIS — I10 Essential (primary) hypertension: Secondary | ICD-10-CM

## 2020-06-06 DIAGNOSIS — D649 Anemia, unspecified: Secondary | ICD-10-CM

## 2020-06-06 DIAGNOSIS — E039 Hypothyroidism, unspecified: Secondary | ICD-10-CM | POA: Diagnosis not present

## 2020-06-06 DIAGNOSIS — U071 COVID-19: Secondary | ICD-10-CM

## 2020-06-06 DIAGNOSIS — Z8673 Personal history of transient ischemic attack (TIA), and cerebral infarction without residual deficits: Secondary | ICD-10-CM

## 2020-06-06 DIAGNOSIS — E11319 Type 2 diabetes mellitus with unspecified diabetic retinopathy without macular edema: Secondary | ICD-10-CM

## 2020-06-06 NOTE — Progress Notes (Signed)
[facility] Summary Report / Loop Recorder 

## 2020-06-06 NOTE — Progress Notes (Signed)
Patient ID: Gregory Crane, male   DOB: [birth year], 49 y.o.   MRN: [id number]   Subjective:    Patient ID: Gregory Crane, male    DOB: 1971-04-18, 49 y.o.   MRN: [id number]  HPI This visit occurred during the SARS-CoV-2 public health emergency.  Safety protocols were in place, including screening questions prior to the visit, additional usage of staff PPE, and extensive cleaning of exam room while observing appropriate contact time as indicated for disinfecting solutions.  Patient here for a scheduled hospital follow up.  He was admitted 05/18/20 with fever, generalized weakness and shaking - SIRS/CAP. CXR with heterogeneous opacities in bases felt to likely be pneumonia and repeat with increased left mid lung opacity.  Was started empirically on vancomycin and zosyn.  Transitioned to po augmentin.  Oxygen was weaned off.  While in hospital - had flash pulmonary edema with elevated blood pressure.  Was started on IV labetalol and IV lasix.  Lasix was changed to oral lasix.  Renal function followed - CKD.  Has chronic anemia - hgb 8.5 (7.9 - 05/20/20).  colonoscopy 03/30/20 - recommended f/u in 6 months for piecemeal polypectomy.  Being worked up by outpatient GI. Sugars have been poorly controlled. Discussed insulin coverage.  Recommended bipap at night.  Blood pressure elevated in hospital.  Recommended continuing amlodipine and hydralazine.  States since discharge, he continues to feel weak.  Taking lasix - total of $Remove'60mg'[id number]$  q day.  States am sugars averaging 120-130.  This am 180.  Since discharge - off ozempic.  No chest pain.  Breathing overall stable.  Still with lower extremity swelling.  Seeing vascular surgery.  Swelling - refractory to compression hose.  Qualifies for lymphedema pump.  Vascular surgery working on getting this arranged.  Discussed the need for further w/up and evaluation - CKD and his anemia.  Also discussed PT to help with strengthening.  Still trying to cope with his wife's unexpected  death.  Discussed.  Has good support.  Does not feel needs any further intervention at this time.    Past Medical History:  Diagnosis Date  . Allergy   . Anemia   . Bell's palsy   . Diabetes mellitus without complication (HCC)    diet controlled  . Hypertension   . Hypothyroidism   . Kidney stones   . Pseudotumor cerebri   . Stroke [facility])    Past Surgical History:  Procedure Laterality Date  . COLONOSCOPY WITH PROPOFOL N/A 03/30/2020   Procedure: COLONOSCOPY WITH PROPOFOL;  Surgeon: Lesly Rubenstein, MD;  Location: [facility] ENDOSCOPY;  Service: Endoscopy;  Laterality: N/A;  . LOOP RECORDER INSERTION N/A 01/27/2018   Procedure: LOOP RECORDER INSERTION;  Surgeon: [name], MD;  Location: [facility];  Service: Cardiovascular;  Laterality: N/A;  . LUMBAR PUNCTURE     as child  . NO PAST SURGERIES    . TEE WITHOUT CARDIOVERSION N/A 01/07/2018   Procedure: TRANSESOPHAGEAL ECHOCARDIOGRAM (TEE);  Surgeon: Minna Merritts, MD;  Location: [facility] ORS;  Service: Cardiovascular;  Laterality: N/A;   Family History  Problem Relation Age of Onset  . Breast cancer Mother   . Diabetes Father   . Diabetes Sister   . Arthritis Maternal Grandmother   . Diabetes Maternal Grandmother    Social History   Socioeconomic History  . Marital status: Married    Spouse name: [name]  . Number of children: 2  . Years of education: Not on file  . Highest  education level: Not on file  Occupational History  . Not on file  Tobacco Use  . Smoking status: Never Smoker  . Smokeless tobacco: Never Used  Vaping Use  . Vaping Use: Never used  Substance and Sexual Activity  . Alcohol use: No    Alcohol/week: 0.0 standard drinks    Comment: very rarely (1-2/year)  . Drug use: No  . Sexual activity: Yes  Other Topics Concern  . Not on file  Social History Narrative   Lives at home with wife, independent at baseline.   Social Determinants of Health   Financial Resource Strain:   .  Difficulty of Paying Living Expenses: Not on file  Food Insecurity:   . Worried About Charity fundraiser in the Last Year: Not on file  . Ran Out of Food in the Last Year: Not on file  Transportation Needs:   . Lack of Transportation (Medical): Not on file  . Lack of Transportation (Non-Medical): Not on file  Physical Activity:   . Days of Exercise per Week: Not on file  . Minutes of Exercise per Session: Not on file  Stress:   . Feeling of Stress : Not on file  Social Connections:   . Frequency of Communication with Friends and Family: Not on file  . Frequency of Social Gatherings with Friends and Family: Not on file  . Attends Religious Services: Not on file  . Active Member of Clubs or Organizations: Not on file  . Attends Archivist Meetings: Not on file  . Marital Status: Not on file    Outpatient Encounter Medications as of 06/06/2020  Medication Sig  . Accu-Chek FastClix Lancets MISC USE TO CHECK BLOOD SUGAR THREE TIMES DAILY AS DIRECTED  . amLODipine (NORVASC) 10 MG tablet Take 1 tablet (10 mg total) by mouth daily.  Marland Kitchen ascorbic acid (VITAMIN C) 500 MG tablet Take 1 tablet (500 mg total) by mouth daily.  Marland Kitchen aspirin (ASPIRIN LOW DOSE) 81 MG EC tablet TAKE 1 TABLET(81 MG) BY MOUTH DAILY  . atorvastatin (LIPITOR) 80 MG tablet TAKE 1 TABLET(80 MG) BY MOUTH DAILY  . clopidogrel (PLAVIX) 75 MG tablet TAKE 1 TABLET(75 MG) BY MOUTH DAILY  . cyanocobalamin (,VITAMIN B-12,) 1000 MCG/ML injection Inject 1 mL into the skin once a week for 3 weeks then once every 30 days.  . DULoxetine (CYMBALTA) 20 MG capsule Take 20 mg by mouth daily.   . furosemide (LASIX) 20 MG tablet Take 60 mg by mouth.   Marland Kitchen glucose blood (FREESTYLE LITE) test strip Check blood sugars twice a day (Dx. 250.02)  . guaiFENesin-dextromethorphan (ROBITUSSIN DM) 100-10 MG/5ML syrup Take 10 mLs by mouth every 4 (four) hours as needed for cough.  Marland Kitchen HUMALOG 100 UNIT/ML injection INJECT 4 TO 6 UNITS UNDER THE SKIN  THREE TIMES DAILY  . hydrALAZINE (APRESOLINE) 50 MG tablet TAKE 1 TABLET(50 MG) BY MOUTH THREE TIMES DAILY  . insulin glargine (LANTUS) 100 UNIT/ML injection ADMINISTER 15 UNITS UNDER THE SKIN EVERY NIGHT AT BEDTIME  . Insulin Syringe-Needle U-100 (INSULIN SYRINGE .5CC/30GX5/16") 30G X 5/16" 0.5 ML MISC USE AS DIRECTED WITH INSULIN  . levothyroxine (SYNTHROID) 137 MCG tablet Take 1 tablet (137 mcg total) by mouth daily before breakfast.  . losartan (COZAAR) 100 MG tablet TAKE 1 TABLET BY MOUTH DAILY  . Multiple Vitamin (MULTIVITAMIN WITH MINERALS) TABS tablet Take 1 tablet by mouth daily.  . pregabalin (LYRICA) 100 MG capsule TAKE 1 CAPSULE(100 MG) BY MOUTH THREE TIMES  DAILY  . Semaglutide, 1 MG/DOSE, (OZEMPIC, 1 MG/DOSE,) 4 MG/3ML SOPN Inject 0.75 mLs (1 mg total) into the skin once a week.  . Syringe/Needle, Disp, (SYRINGE 3CC/25GX1") 25G X 1" 3 ML MISC Use as directed to administer b12 injections.  Marland Kitchen zinc sulfate 220 (50 Zn) MG capsule Take 1 capsule (220 mg total) by mouth daily.   No facility-administered encounter medications on file as of 06/06/2020.    Review of Systems  Constitutional: Positive for fatigue. Negative for appetite change.  HENT: Negative for congestion.   Respiratory: Negative for cough and chest tightness.        Breathing stable.   Cardiovascular: Positive for leg swelling. Negative for chest pain and palpitations.  Gastrointestinal: Negative for abdominal pain, diarrhea, nausea and vomiting.  Genitourinary: Negative for difficulty urinating and dysuria.  Musculoskeletal: Negative for joint swelling and myalgias.  Skin: Negative for color change and rash.  Neurological: Negative for dizziness, light-headedness and headaches.  Psychiatric/Behavioral: Negative for agitation and dysphoric mood.       Objective:    Physical Exam Vitals reviewed.  Constitutional:      General: He is not in acute distress.    Appearance: Normal appearance. He is well-developed.   HENT:     Head: Normocephalic and atraumatic.     Right Ear: External ear normal.     Left Ear: External ear normal.  Eyes:     General: No scleral icterus.       Right eye: No discharge.        Left eye: No discharge.     Conjunctiva/sclera: Conjunctivae normal.  Cardiovascular:     Rate and Rhythm: Normal rate and regular rhythm.  Pulmonary:     Effort: Pulmonary effort is normal. No respiratory distress.     Breath sounds: Normal breath sounds.  Abdominal:     General: Bowel sounds are normal.     Palpations: Abdomen is soft.     Tenderness: There is no abdominal tenderness.  Musculoskeletal:        General: Swelling present. No tenderness.     Cervical back: Neck supple. No tenderness.     Comments: Increased lower extremity swelling and pedal edema.    Lymphadenopathy:     Cervical: No cervical adenopathy.  Skin:    Findings: No erythema or rash.  Neurological:     Mental Status: He is alert.  Psychiatric:        Mood and Affect: Mood normal.        Behavior: Behavior normal.     BP (!) 142/78   Pulse 81   Temp 97.6 F (36.4 C) (Oral)   Resp 16   Ht 6' (1.829 m)   Wt (!) 315 lb 9.6 oz (143.2 kg)   SpO2 98%   BMI 42.80 kg/m  Wt Readings from Last 3 Encounters:  06/06/20 (!) 315 lb 9.6 oz (143.2 kg)  06/02/20 (!) 313 lb 6.4 oz (142.2 kg)  05/16/20 (!) 310 lb 12.8 oz (141 kg)     Lab Results  Component Value Date   WBC 5.1 05/16/2020   HGB 9.2 Repeated and verified X2. (L) 05/16/2020   HCT 27.2 (L) 05/16/2020   PLT 167.0 05/16/2020   GLUCOSE 122 (H) 05/16/2020   CHOL 109 03/28/2020   TRIG 93 04/04/2020   HDL 31 (L) 03/28/2020   LDLCALC 60 03/28/2020   ALT 14 05/16/2020   AST 22 05/16/2020   NA 142 05/16/2020   K 4.1  05/16/2020   CL 111 05/16/2020   CREATININE 2.45 (H) 05/16/2020   BUN 34 (H) 05/16/2020   CO2 24 05/16/2020   TSH 15.38 (H) 05/16/2020   INR 0.85 04/09/2018   HGBA1C 6.7 (H) 04/04/2020       Assessment & Plan:   Problem  List Items Addressed This Visit    Weakness    Unsteadiness/weakness.  Has peripheral neuropathy.  Recently hospitalized.  Feel weakness is multifactorial.  Refer to home health for PT.  Follow.       Relevant Orders   Ambulatory referral to Home Health   Swelling of left lower extremity    Bilateral lower extremity edema.  Has tried compression hose. Persistent swelling.  Due to f/u with vascular surgery for further evaluation and treatment - question of lymphedema pump.        Pneumonia due to COVID-19 virus    Recent hospitalization requiring oxygen.  Off oxygen now.  Feels breathing is stable.  Will need f/u cxr to confirm clearance.  Have discussed pulmonary evaluation.       Neuropathy    On lyrica.       Relevant Orders   Ambulatory referral to Home Health   Hypothyroidism    On thyroid replacement.  Needs to take regularly.  Follow tsh.       Hyperlipidemia    On lipitor.  Low cholesterol diet and exercise.  Follow lipid panel and liver function tests.        History of CVA (cerebrovascular accident)    Continue plavix.       Relevant Orders   Ambulatory referral to [place] hypertension    On amlodipine and hydralazine.  Also taking lasix.  Losartan held in hospital.  Follow pressures.  Follow metabolic panel.       Diabetes ([place])    Has been poorly controlled. Have discussed with him regarding importance of checking sugars and compliance with medication.  Follow sugars - to adjust insulin.   Follow met b and a1c.       CKD (chronic kidney disease) stage 4, GFR 15-29 ml/min (HCC)    Renal function as outlined.  Avoid antiinflammatories.  Discussed the need for f/u with nephrology.        Anemia    Decreased hgb - around 8.5.  Had recent colonoscopy.  Needs f/u with hematology.  Will need f/u b12 and iron studies.            [name], MD

## 2020-06-11 ENCOUNTER — Encounter: Payer: Self-pay | Admitting: Internal Medicine

## 2020-06-12 ENCOUNTER — Telehealth: Payer: Self-pay | Admitting: Internal Medicine

## 2020-06-12 NOTE — Telephone Encounter (Signed)
Pt called [facility] needs a copy of his most recent labs faxed to them at [phone]

## 2020-06-12 NOTE — Telephone Encounter (Signed)
Labs faxed

## 2020-06-14 ENCOUNTER — Telehealth: Payer: Self-pay | Admitting: Internal Medicine

## 2020-06-14 NOTE — Telephone Encounter (Signed)
Patient is requesting to speak with Dr. Olin Pia nurse to discuss when he can have loop recorder removed. He assumes he has had it in for 2-3 years and he would like to know how much longer he will need it. Please advise.

## 2020-06-14 NOTE — Telephone Encounter (Signed)
Attempted to contact, unable to leave message due to mailbox being full. Loop implanted 01/27/18 and battery life approximately 3.5 years from implant.

## 2020-06-18 ENCOUNTER — Encounter: Payer: Self-pay | Admitting: Internal Medicine

## 2020-06-18 DIAGNOSIS — R531 Weakness: Secondary | ICD-10-CM | POA: Insufficient documentation

## 2020-06-18 NOTE — Assessment & Plan Note (Signed)
Has been poorly controlled. Have discussed with him regarding importance of checking sugars and compliance with medication.  Follow sugars - to adjust insulin.   Follow met b and a1c.

## 2020-06-18 NOTE — Assessment & Plan Note (Signed)
Renal function as outlined.  Avoid antiinflammatories.  Discussed the need for f/u with nephrology.

## 2020-06-18 NOTE — Assessment & Plan Note (Signed)
Recent hospitalization requiring oxygen.  Off oxygen now.  Feels breathing is stable.  Will need f/u cxr to confirm clearance.  Have discussed pulmonary evaluation.

## 2020-06-18 NOTE — Assessment & Plan Note (Signed)
Bilateral lower extremity edema.  Has tried compression hose. Persistent swelling.  Due to f/u with vascular surgery for further evaluation and treatment - question of lymphedema pump.

## 2020-06-18 NOTE — Assessment & Plan Note (Signed)
Continue plavix °

## 2020-06-18 NOTE — Assessment & Plan Note (Signed)
Unsteadiness/weakness.  Has peripheral neuropathy.  Recently hospitalized.  Feel weakness is multifactorial.  Refer to home health for PT.  Follow.

## 2020-06-18 NOTE — Assessment & Plan Note (Signed)
On amlodipine and hydralazine.  Also taking lasix.  Losartan held in hospital.  Follow pressures.  Follow metabolic panel.

## 2020-06-18 NOTE — Assessment & Plan Note (Signed)
Decreased hgb - around 8.5.  Had recent colonoscopy.  Needs f/u with hematology.  Will need f/u b12 and iron studies.

## 2020-06-18 NOTE — Assessment & Plan Note (Signed)
On thyroid replacement.  Needs to take regularly.  Follow tsh.

## 2020-06-18 NOTE — Assessment & Plan Note (Signed)
On lyrica

## 2020-06-18 NOTE — Assessment & Plan Note (Signed)
On lipitor.  Low cholesterol diet and exercise.  Follow lipid panel and liver function tests.   

## 2020-06-21 NOTE — Telephone Encounter (Signed)
Attempted to call patient to discuss. No answer.   Left detailed message on VM answering his question (DPR on file). Advised patient to call with any questions or concerns.

## 2020-06-26 NOTE — Progress Notes (Signed)
Cardiology Office Note Date:  06/26/2020  Patient ID:  Gregory Crane, Gregory Crane Jun 01, 1971, MRN [id number] PCP:  Gregory Pheasant, MD  Cardiologist:  Dr. Saunders Crane (2017) Electrophysiologist: Dr. Caryl Crane   Chief Complaint: edema  History of Present Illness: Gregory Crane is a 49 y.o. male with history of DM, renal calculi, CKD (IV), stroke, HTN, morbid obesity  Referred to Dr. Saunders Crane in 2017 noted with orthostatic hypotension, palpitations.  Planned to start with echo and stress testing and monitoring indicated. Echo looked ok, stress did as well.  2019, suffered a stroke, fogginess and L facial droop.   Imaging demonstrated  Severe stenosis of L ACA.  MRI >> acute putamen infarct -- TEE>>Neg for LA clot  referred to Dr. Caryl Crane out patient, he discussed: "recurrent strokes, suggestive of small vessel disease.  They do not seem to be likely embolic.  After discussions however with Dr. Doy Crane, given the recurrent strokes, she thinks that the exclusion of atrial fibrillation is important to even though it is relatively low likelihood.  I think the situation is further complicated by the lack of data regarding the appropriate response to SCAF in the setting of recurrent strokes.  It is further challenged by the degree of cerebrovascular disease.  In this regard, risk factor modification is essential; we will increase his Lipitor from 40--80" He underwent loop implant may 2019  Saw [facility] (Dr. Waldron Crane), 2019, not found to have any critical carotid disease and planned for surveillance.  Saw pulm for sleep study Dec 2020  The patient recently called inquiring about removal of his loop.  TODAY He is accompanied by his sister today. He reports that in the last year he has had progressive LE swelling that has slowly by surly crept up and reports to his waist. He reports that he has known lymphedema and follows with Dr. Lucky Crane at [facility].  He was recommended to have  His last note 06/02/20 mentions likely  progressed to stage III lymphedema refractory to compression and elevation, and again recommended lymphedema pump (previously declined by his insurance) He went to [facility] ER, spent the night in the ER, says they gave him a dose of IV lasix and told him to follow up with cardiology and needed a kidney doctor for protein in his urine.  He denies knowledge of any chronic kidney problems though appears in his chart.  Aug 2021 he was hospitalized with COVD, had AKI on CKD, on discharge told to keep well hydrated. Discharged 8/13, his Creat peaked there 4.08 (they do not describe edema)  At [facility] on 06/13/20 BUN/Creat was 42/3.0  He tells me that he has been swollen for well over a year, though never to this extent, and has been about where he currently is for about a month or longer, his ER note reports 3-4 months He denies rest SOB and only minimal DOE, no CP, palpitations. He does have symptoms of orthopnea now for about a month or so. In review of [facility] notes, he was edematous, not SOB or hypoxic, given a dose of IV lasix, monitored and given chronicity of his edema, admission not indicated  No dizzy spells, near syncope or syncope.    Device information MDT LNQ implanted 01/27/2018, cryptogenic stroke   Past Medical History:  Diagnosis Date  . Allergy   . Anemia   . Bell's palsy   . Diabetes mellitus without complication (HCC)    diet controlled  . Hypertension   . Hypothyroidism   .  Kidney stones   . Pseudotumor cerebri   . Stroke [facility])     Past Surgical History:  Procedure Laterality Date  . COLONOSCOPY WITH PROPOFOL N/A 03/30/2020   Procedure: COLONOSCOPY WITH PROPOFOL;  Surgeon: Lesly Rubenstein, MD;  Location: [facility] ENDOSCOPY;  Service: Endoscopy;  Laterality: N/A;  . LOOP RECORDER INSERTION N/A 01/27/2018   Procedure: LOOP RECORDER INSERTION;  Surgeon: [name], MD;  Location: [facility];  Service: Cardiovascular;  Laterality: N/A;  . LUMBAR PUNCTURE     as  child  . NO PAST SURGERIES    . TEE WITHOUT CARDIOVERSION N/A 01/07/2018   Procedure: TRANSESOPHAGEAL ECHOCARDIOGRAM (TEE);  Surgeon: Minna Merritts, MD;  Location: [facility] ORS;  Service: Cardiovascular;  Laterality: N/A;    Current Outpatient Medications  Medication Sig Dispense Refill  . Accu-Chek FastClix Lancets MISC USE TO CHECK BLOOD SUGAR THREE TIMES DAILY AS DIRECTED 306 each 100  . amLODipine (NORVASC) 10 MG tablet Take 1 tablet (10 mg total) by mouth daily. 30 tablet 1  . ascorbic acid (VITAMIN C) 500 MG tablet Take 1 tablet (500 mg total) by mouth daily. 90 tablet 0  . aspirin (ASPIRIN LOW DOSE) 81 MG EC tablet TAKE 1 TABLET(81 MG) BY MOUTH DAILY 90 tablet 1  . atorvastatin (LIPITOR) 80 MG tablet TAKE 1 TABLET(80 MG) BY MOUTH DAILY 90 tablet 0  . clopidogrel (PLAVIX) 75 MG tablet TAKE 1 TABLET(75 MG) BY MOUTH DAILY 90 tablet 2  . cyanocobalamin (,VITAMIN B-12,) 1000 MCG/ML injection Inject 1 mL into the skin once a week for 3 weeks then once every 30 days. 10 mL 0  . DULoxetine (CYMBALTA) 20 MG capsule Take 20 mg by mouth daily.   3  . furosemide (LASIX) 20 MG tablet Take 60 mg by mouth.     Marland Kitchen glucose blood (FREESTYLE LITE) test strip Check blood sugars twice a day (Dx. 250.02) 200 each 1  . guaiFENesin-dextromethorphan (ROBITUSSIN DM) 100-10 MG/5ML syrup Take 10 mLs by mouth every 4 (four) hours as needed for cough. 118 mL 0  . HUMALOG 100 UNIT/ML injection INJECT 4 TO 6 UNITS UNDER THE SKIN THREE TIMES DAILY 30 mL 0  . hydrALAZINE (APRESOLINE) 50 MG tablet TAKE 1 TABLET(50 MG) BY MOUTH THREE TIMES DAILY 90 tablet 2  . insulin glargine (LANTUS) 100 UNIT/ML injection ADMINISTER 15 UNITS UNDER THE SKIN EVERY NIGHT AT BEDTIME 30 mL 0  . Insulin Syringe-Needle U-100 (INSULIN SYRINGE .5CC/30GX5/16") 30G X 5/16" 0.5 ML MISC USE AS DIRECTED WITH INSULIN 300 each 5  . levothyroxine (SYNTHROID) 137 MCG tablet Take 1 tablet (137 mcg total) by mouth daily before breakfast. 90 tablet 1  .  losartan (COZAAR) 100 MG tablet TAKE 1 TABLET BY MOUTH DAILY 90 tablet 1  . Multiple Vitamin (MULTIVITAMIN WITH MINERALS) TABS tablet Take 1 tablet by mouth daily. 90 tablet 0  . pregabalin (LYRICA) 100 MG capsule TAKE 1 CAPSULE(100 MG) BY MOUTH THREE TIMES DAILY 270 capsule 1  . Semaglutide, 1 MG/DOSE, (OZEMPIC, 1 MG/DOSE,) 4 MG/3ML SOPN Inject 0.75 mLs (1 mg total) into the skin once a week. 9 mL 3  . Syringe/Needle, Disp, (SYRINGE 3CC/25GX1") 25G X 1" 3 ML MISC Use as directed to administer b12 injections. 50 each 1  . zinc sulfate 220 (50 Zn) MG capsule Take 1 capsule (220 mg total) by mouth daily. 90 capsule 0   No current facility-administered medications for this visit.    Allergies:   Patient has no  known allergies.   Social History:  The patient  reports that he has never smoked. He has never used smokeless tobacco. He reports that he does not drink alcohol and does not use drugs.   Family History:  The patient's family history includes Arthritis in his maternal grandmother; Breast cancer in his mother; Diabetes in his father, maternal grandmother, and sister.  ROS:  Please see the history of present illness.    All other systems are reviewed and otherwise negative.   PHYSICAL EXAM:  VS:  There were no vitals taken for this visit. BMI: There is no height or weight on file to calculate BMI. Well nourished, well developed, in no acute distress HEENT: normocephalic, atraumatic Neck: +3 JVD, carotid bruits or masses Cardiac:  RRR; no significant murmurs, no rubs, or gallops Lungs:  CTA b/l, no wheezing, rhonchi or rales Abd: soft, nontender, not distended, obese MS: no deformity or  atrophy Ext: tense edema  Skin: warm and dry, no rash Neuro:  No gross deficits appreciated Psych: euthymic mood, full affect  ILR site is stable, no tethering or discomfort   EKG:  Done today and reviewed by myself shows  SR, appears unchanged from priors  Device interrogation done today and  reviewed by myself:  Battery is OK Interrogate all shows no arrhythmias   01/07/2018: TEE Study Conclusions  - Left ventricle: Systolic function was normal. The estimated  ejection fraction was in the range of 55% to 60%. Wall motion was  normal; there were no regional wall motion abnormalities.  - Left atrium: No evidence of thrombus in the atrial cavity or  appendage. No evidence of thrombus in the atrial cavity or  appendage.  - Right ventricle: Systolic function was normal.  - Right atrium: No evidence of thrombus in the atrial cavity or  appendage.  - Atrial septum: No defect or patent foramen ovale was identified.  Echo contrast study showed no right-to-left atrial level shunt,  at baseline or with provocation.    09/13/2017: TTE Study Conclusions  - Left ventricle: The cavity size was normal. Wall thickness was  increased in a pattern of mild LVH. Systolic function was normal.  The estimated ejection fraction was in the range of 60% to 65%.  Doppler parameters are consistent with abnormal left ventricular  relaxation (grade 1 diastolic dysfunction).  - Aortic valve: Trileaflet; mildly thickened leaflets.  - Ascending aorta: The ascending aorta was mildly dilated.  - Mitral valve: Mildly thickened leaflets .  - Right ventricle: The cavity size was normal. Systolic function  was normal.  - Pulmonary arteries: The main pulmonary artery was mildly dilated.   Impressions:  - This study does not exclude cardioembolic source.     08/15/2016: Stress test Normal treadmill stress test with no evidence of ischemia. Moderately impaired exercise capacity for age with hypertensive response to exercise. The patient exercised for 6 minutes and achieved a maximum heart rate of 150 bpm the maximum blood pressure 205/84. He complained of dizziness and legs giving out and could not continue on stage III of Bruce protocol.  I do not see monitoring was  completed.   Recent Crane: 04/07/2020: Magnesium 2.3 05/16/2020: ALT 14; BUN 34; Creatinine, Ser 2.45; Hemoglobin 9.2 Repeated and verified X2.; Platelets 167.0; Potassium 4.1; Sodium 142; TSH 15.38  03/28/2020: Chol/HDL Ratio 3.5; Cholesterol, Total 109; HDL 31; LDL Chol Calc (NIH) 60 04/04/2020: Triglycerides 93   CrCl cannot be calculated (Patient's most recent lab result is older than the  maximum 21 days allowed.).   Wt Readings from Last 3 Encounters:  06/06/20 (!) 315 lb 9.6 oz (143.2 kg)  06/02/20 (!) 313 lb 6.4 oz (142.2 kg)  05/16/20 (!) 310 lb 12.8 oz (141 kg)     Other studies reviewed: Additional studies/records reviewed today include: summarized above  ASSESSMENT AND PLAN:  1. Cryptogenic stroke     ILR     No AFib to date  2. Edema     This is not new, but is progressing.     He has been following with [facility] and known to have lymphedema which makes volume assessment  harder     Seated he has about 3 JVD     His lungs are clear and he is not SOB, but dose describe orthopnea, O2 on RA is 96%      He seemed surprise to hear that he had kidney issues. He sees nephrology tomorrow, he can not recall the name but one of the doctors w/[facility]. I discussed that the nephrologist will be driving his diuretics  For today, take another 60mg  of lasix tonight (for BID today) and get further instructions from nephrology tomorrow for further.  Will get an echo ASAP and have him back in a couple weeks. Should he have any progressive SOB is instructed to go to the ER   Reviewed findings and plan with Dr. Caryl Crane, agrees and suspects nephrology will likely change his diuretic, likely to metolazone.   Disposition: F/u as above  Current medicines are reviewed at length with the patient today.  The patient did not have any concerns regarding medicines.  Venetia Night, PA-C 06/26/2020 7:43 AM     [place]  [id number] [phone] (office)  [phone] (fax)

## 2020-06-27 ENCOUNTER — Encounter: Payer: Self-pay | Admitting: Physician Assistant

## 2020-06-27 ENCOUNTER — Other Ambulatory Visit: Payer: Self-pay | Admitting: Internal Medicine

## 2020-06-27 ENCOUNTER — Other Ambulatory Visit: Payer: Self-pay

## 2020-06-27 ENCOUNTER — Ambulatory Visit: Payer: Self-pay | Admitting: Pharmacist

## 2020-06-27 ENCOUNTER — Ambulatory Visit (INDEPENDENT_AMBULATORY_CARE_PROVIDER_SITE_OTHER): Payer: Managed Care, Other (non HMO) | Admitting: Physician Assistant

## 2020-06-27 VITALS — BP 150/80 | HR 82 | Ht 72.0 in | Wt 327.2 lb

## 2020-06-27 DIAGNOSIS — I10 Essential (primary) hypertension: Secondary | ICD-10-CM | POA: Diagnosis not present

## 2020-06-27 DIAGNOSIS — E785 Hyperlipidemia, unspecified: Secondary | ICD-10-CM

## 2020-06-27 DIAGNOSIS — I509 Heart failure, unspecified: Secondary | ICD-10-CM | POA: Diagnosis not present

## 2020-06-27 DIAGNOSIS — Z794 Long term (current) use of insulin: Secondary | ICD-10-CM

## 2020-06-27 DIAGNOSIS — Z4509 Encounter for adjustment and management of other cardiac device: Secondary | ICD-10-CM | POA: Diagnosis not present

## 2020-06-27 DIAGNOSIS — E11319 Type 2 diabetes mellitus with unspecified diabetic retinopathy without macular edema: Secondary | ICD-10-CM

## 2020-06-27 DIAGNOSIS — Z8673 Personal history of transient ischemic attack (TIA), and cerebral infarction without residual deficits: Secondary | ICD-10-CM

## 2020-06-27 MED ORDER — FUROSEMIDE 20 MG PO TABS: 60.0000 mg | ORAL_TABLET | Freq: Two times a day (BID) | ORAL | 7 refills | Status: DC

## 2020-06-27 MED ORDER — AMLODIPINE BESYLATE 10 MG PO TABS
10.0000 mg | ORAL_TABLET | Freq: Every day | ORAL | 1 refills | Status: DC
Start: 2020-06-27 — End: 2020-12-19

## 2020-06-27 MED ORDER — DULOXETINE HCL 20 MG PO CPEP: 20.0000 mg | ORAL_CAPSULE | Freq: Every day | ORAL | 1 refills | Status: DC

## 2020-06-27 MED ORDER — FUROSEMIDE 20 MG PO TABS: 60.0000 mg | ORAL_TABLET | Freq: Two times a day (BID) | ORAL | 0 refills | Status: DC

## 2020-06-27 NOTE — Patient Instructions (Addendum)
[name],   It was great talking with you today!  Our goal is to maintain your A1c at less than 7%. This corresponds with fasting sugars less than 130 and 2 hour after meal sugars less than 180. Please check your blood sugar 2-3 times daily, fasting and after meals. At our next call, let's talk about looking into insurance coverage for a Continuous Glucose Monitor (like the FreeStyle Libre 2) device for you, to help keep better (and easier) track of your sugars.   Our goal blood pressure is less than 130/80. Please check your blood pressure periodically at home. This will help your medical team determine if adjustments need to be made to your blood pressure regimen  Our goal bad cholesterol, or LDL, is less than 70, due to your history of a stroke and having diabetes. This is why it is important to continue taking your atorvastatin.  Feel free to call me with any questions or concerns. I look forward to our next visit!  Catie [name], PharmD, [place], CPP Direct line: [phone] Clinic phone: [phone]   Visit Information  Goals Addressed              This Visit's Progress     Patient Stated   .  PharmD "I want to work on my blood sugars" (pt-stated)        CARE PLAN ENTRY (see longitudinal plan of care for additional care plan information)  Current Barriers:  . Social, financial, community barriers:  o S/p COVID. Wife died unexpectedly from [place] o Reports that swelling that was in his legs "is now everywhere". Has cardiology appt today, nephrology appt tomorrow.  o Reports good support from his sister since his wife died.  . Diabetes: controlled; complicated by chronic medical conditions including CKD, HTN, most recent A1c 6.7%% . Most recent eGFR: ~23 mL/min (per Care Everywhere) . Current antihyperglycemic regimen: Ozempic 1 mg weekly, Lantus 15 units QPM, Humalog 4-6 units with meals . Denies hypoglycemic symptoms, including dizziness, lightheadedness, shaking,  sweating . Current blood glucose readings:  o Fasting: usually 120-130s, occasionally 180s if bigger supper or evening snacks o Occasional checks in between lunch and supper: ~120-130s, occasionally a little  . Cardiovascular risk reduction: o Current hypertensive regimen: amlodipine 10 mg QAM, losartan 100 mg QAM, hydralazine 50 mg TID, furosemide 60 mg QAM - Home BP readings: has not been checking at home. Notes his wife had a BP machine, but he would need to find where she kept it o Current hyperlipidemia regimen: atorvastatin 80 mg daily, LDL at goal <70 o Current antiplatelet regimen: ASA 81 mg daily, clopidogrel 75 mg daily- s/p CVA, unsure if DAPT still appropriate at this time or if deescalation to clopidogrel monotherapy is appropriate. . Eye exam: up to date . Nephropathy screening: up to date . Food exam: due . Depression/neuropathy: pregabalin 100 mg TID, duloxetine 20 mg - patient reports he has not been taking this "for a while". Refill history indicates that he has been off of this for at least a year. Requests refill.  . Hypothyroidism: levothyroxine 137 mcg daily . Supplements: MVI, Vitamin D, Vitamin C, zinc, vitamin B12 injections  Pharmacist Clinical Goal(s):  Marland Kitchen Over the next 90 days, patient will work with PharmD and primary care provider to address optimized medication management  Interventions: . Comprehensive medication review performed, medication list updated in electronic medical record . Inter-disciplinary care team collaboration (see longitudinal plan of care) . Patient requests refill on amlodipine, last  prescribed by hospitalist. Refill sent.  . Discussed benefit of home BP monitoring. Encouraged patient to start checking BP at home periodically . Encouraged continued adherence to Ozempic + insulin regimen. Consider use of CGM moving forward, pending insurance changes w/ the new year.  . Will communicate request for duloxetine refill to PCP.  Marland Kitchen Lipids at goal.  Fill hx up to date. Continue atorvastatin.  Kennon Holter cardiology PA Mardee Postin regarding DAPT duration. Appt this afternoon w/ patient.   Patient Self Care Activities:  . Patient will check blood glucose BID , document, and provide at future appointments . Patient will check blood pressure periodically, document, and provide at future appointment . Patient will take medications as prescribed . Patient will report any questions or concerns to provider   Please see past updates related to this goal by clicking on the "Past Updates" button in the selected goal         The patient verbalized understanding of instructions provided today and declined a print copy of patient instruction materials.    Plan:  - Scheduled f/u call in ~ 10 weeks  Catie [name], PharmD, [place], [place] Pharmacist [place] [phone]

## 2020-06-27 NOTE — Progress Notes (Signed)
rx sent in for cymbalta 20mg .

## 2020-06-27 NOTE — Patient Instructions (Addendum)
Medication Instructions:  . TODAY ONLY: TAKE FUROSEMIDE (LASIX)  60 MG TWICE A DAY FOLLOW UP WITH NEPHROLOGY TOMMORROW FOR FURTHER  RECCOMENDATIONS  *If you need a refill on your cardiac medications before your next appointment, please call your pharmacy*   Lab Work: NONE ORDERED  TODAY   If you have labs (blood work) drawn today and your tests are completely normal, you will receive your results only by: Marland Kitchen MyChart Message (if you have MyChart) OR . A paper copy in the mail If you have any lab test that is abnormal or we need to change your treatment, we will call you to review the results.   Testing/Procedures: Your physician has requested that you have an echocardiogram. Echocardiography is a painless test that uses sound waves to create images of your heart. It provides your doctor with information about the size and shape of your heart and how well your heart's chambers and valves are working. This procedure takes approximately one hour. There are no restrictions for this procedure.   Follow-Up: At [facility], you and your health needs are our priority.  As part of our continuing mission to provide you with exceptional heart care, we have created designated Provider Care Teams.  These Care Teams include your primary Cardiologist (physician) and Advanced Practice Providers (APPs -  Physician Assistants and Nurse Practitioners) who all work together to provide you with the care you need, when you need it.  We recommend signing up for the patient portal called "MyChart".  Sign up information is provided on this After Visit Summary.  MyChart is used to connect with patients for Virtual Visits (Telemedicine).  Patients are able to view lab/test results, encounter notes, upcoming appointments, etc.  Non-urgent messages can be sent to your provider as well.   To learn more about what you can do with MyChart, go to [email or web address].    Your next appointment:   2-3 week(s)  The  format for your next appointment:   In Person  Provider:   Virl Axe, MD   Other Instructions

## 2020-06-27 NOTE — Chronic Care Management (AMB) (Signed)
Chronic Care Management   Follow Up Note   06/27/2020 Name: Gregory Crane MRN: [id number] DOB: 06-09-1971  Referred by: [name], MD Reason for referral : Chronic Care Management (Medication Management)   Gregory Crane is a 49 y.o. year old male who is a primary care patient of [name], MD. The CCM team was consulted for assistance with chronic disease management and care coordination needs.    Contacted patient for medication management f/u.  Review of patient status, including review of consultants reports, relevant laboratory and other test results, and collaboration with appropriate care team members and the patient's provider was performed as part of comprehensive patient evaluation and provision of chronic care management services.    SDOH (Social Determinants of Health) assessments performed: Yes See Care Plan activities for detailed interventions related to SDOH)  SDOH Interventions     Most Recent Value  SDOH Interventions  SDOH Interventions for the Following Domains Depression, Stress  Financial Strain Interventions Intervention Not Indicated  Stress Interventions Other (Comment)  [encouraged continued acceptance of support from family, friends]  Depression Interventions/Treatment  Medication, PHQ2-9 Score <4 Follow-up Not Indicated       Outpatient Encounter Medications as of 06/27/2020  Medication Sig Note  . Accu-Chek FastClix Lancets MISC USE TO CHECK BLOOD SUGAR THREE TIMES DAILY AS DIRECTED   . amLODipine (NORVASC) 10 MG tablet Take 1 tablet (10 mg total) by mouth daily.   Marland Kitchen ascorbic acid (VITAMIN C) 500 MG tablet Take 1 tablet (500 mg total) by mouth daily.   Marland Kitchen aspirin (ASPIRIN LOW DOSE) 81 MG EC tablet TAKE 1 TABLET(81 MG) BY MOUTH DAILY   . atorvastatin (LIPITOR) 80 MG tablet TAKE 1 TABLET(80 MG) BY MOUTH DAILY   . cholecalciferol (VITAMIN D3) 25 MCG (1000 UNIT) tablet Take 1,000 Units by mouth daily.   . clopidogrel (PLAVIX) 75 MG tablet TAKE  1 TABLET(75 MG) BY MOUTH DAILY   . cyanocobalamin (,VITAMIN B-12,) 1000 MCG/ML injection Inject 1 mL into the skin once a week for 3 weeks then once every 30 days.   . furosemide (LASIX) 20 MG tablet Take 60 mg by mouth.    Marland Kitchen glucose blood (FREESTYLE LITE) test strip Check blood sugars twice a day (Dx. 250.02)   . HUMALOG 100 UNIT/ML injection INJECT 4 TO 6 UNITS UNDER THE SKIN THREE TIMES DAILY   . hydrALAZINE (APRESOLINE) 50 MG tablet TAKE 1 TABLET(50 MG) BY MOUTH THREE TIMES DAILY   . insulin glargine (LANTUS) 100 UNIT/ML injection ADMINISTER 15 UNITS UNDER THE SKIN EVERY NIGHT AT BEDTIME   . Insulin Syringe-Needle U-100 (INSULIN SYRINGE .5CC/30GX5/16") 30G X 5/16" 0.5 ML MISC USE AS DIRECTED WITH INSULIN   . levothyroxine (SYNTHROID) 137 MCG tablet Take 1 tablet (137 mcg total) by mouth daily before breakfast.   . losartan (COZAAR) 100 MG tablet TAKE 1 TABLET BY MOUTH DAILY 06/27/2020: QAM  . Multiple Vitamin (MULTIVITAMIN WITH MINERALS) TABS tablet Take 1 tablet by mouth daily.   . pregabalin (LYRICA) 100 MG capsule TAKE 1 CAPSULE(100 MG) BY MOUTH THREE TIMES DAILY   . Semaglutide, 1 MG/DOSE, (OZEMPIC, 1 MG/DOSE,) 4 MG/3ML SOPN Inject 0.75 mLs (1 mg total) into the skin once a week.   . Syringe/Needle, Disp, (SYRINGE 3CC/25GX1") 25G X 1" 3 ML MISC Use as directed to administer b12 injections.   Marland Kitchen zinc sulfate 220 (50 Zn) MG capsule Take 1 capsule (220 mg total) by mouth daily.   . [DISCONTINUED] amLODipine (NORVASC) 10 MG  tablet Take 1 tablet (10 mg total) by mouth daily. 06/27/2020: QAM  . DULoxetine (CYMBALTA) 20 MG capsule Take 20 mg by mouth daily.  (Patient not taking: Reported on 06/27/2020)   . guaiFENesin-dextromethorphan (ROBITUSSIN DM) 100-10 MG/5ML syrup Take 10 mLs by mouth every 4 (four) hours as needed for cough. (Patient not taking: Reported on 06/27/2020)    No facility-administered encounter medications on file as of 06/27/2020.     Objective:   Goals Addressed                This Visit's Progress     Patient Stated   .  PharmD "I want to work on my blood sugars" (pt-stated)        CARE PLAN ENTRY (see longitudinal plan of care for additional care plan information)  Current Barriers:  . Social, financial, community barriers:  o S/p COVID. Wife died unexpectedly from [place] o Reports that swelling that was in his legs "is now everywhere". Has cardiology appt today, nephrology appt tomorrow.  o Reports good support from his sister since his wife died.  . Diabetes: controlled; complicated by chronic medical conditions including CKD, HTN, most recent A1c 6.7%% . Most recent eGFR: ~23 mL/min (per Care Everywhere) . Current antihyperglycemic regimen: Ozempic 1 mg weekly, Lantus 15 units QPM, Humalog 4-6 units with meals . Denies hypoglycemic symptoms, including dizziness, lightheadedness, shaking, sweating . Current blood glucose readings:  o Fasting: usually 120-130s, occasionally 180s if bigger supper or evening snacks o Occasional checks in between lunch and supper: ~120-130s, occasionally a little  . Cardiovascular risk reduction: o Current hypertensive regimen: amlodipine 10 mg QAM, losartan 100 mg QAM, hydralazine 50 mg TID, furosemide 60 mg QAM - Home BP readings: has not been checking at home. Notes his wife had a BP machine, but he would need to find where she kept it o Current hyperlipidemia regimen: atorvastatin 80 mg daily, LDL at goal <70 o Current antiplatelet regimen: ASA 81 mg daily, clopidogrel 75 mg daily- s/p CVA, unsure if DAPT still appropriate at this time or if deescalation to clopidogrel monotherapy is appropriate. . Eye exam: up to date . Nephropathy screening: up to date . Food exam: due . Depression/neuropathy: pregabalin 100 mg TID, duloxetine 20 mg - patient reports he has not been taking this "for a while". Refill history indicates that he has been off of this for at least a year. Requests refill.  . Hypothyroidism: levothyroxine  137 mcg daily . Supplements: MVI, Vitamin D, Vitamin C, zinc, vitamin B12 injections  Pharmacist Clinical Goal(s):  Marland Kitchen Over the next 90 days, patient will work with PharmD and primary care provider to address optimized medication management  Interventions: . Comprehensive medication review performed, medication list updated in electronic medical record . Inter-disciplinary care team collaboration (see longitudinal plan of care) . Patient requests refill on amlodipine, last prescribed by hospitalist. Refill sent.  . Discussed benefit of home BP monitoring. Encouraged patient to start checking BP at home periodically . Encouraged continued adherence to Ozempic + insulin regimen. Consider use of CGM moving forward, pending insurance changes w/ the new year.  . Will communicate request for duloxetine refill to PCP.  Marland Kitchen Lipids at goal. Fill hx up to date. Continue atorvastatin.  Kennon Holter cardiology PA Mardee Postin regarding DAPT duration. Appt this afternoon w/ patient.   Patient Self Care Activities:  . Patient will check blood glucose BID , document, and provide at future appointments . Patient will check blood pressure periodically,  document, and provide at future appointment . Patient will take medications as prescribed . Patient will report any questions or concerns to provider   Please see past updates related to this goal by clicking on the "Past Updates" button in the selected goal          Plan:  - Scheduled f/u call in ~ 10 weeks  Catie [name], PharmD, [place], [place] Pharmacist [place] [phone]

## 2020-06-28 ENCOUNTER — Other Ambulatory Visit (HOSPITAL_COMMUNITY): Payer: Self-pay | Admitting: Nephrology

## 2020-06-28 ENCOUNTER — Other Ambulatory Visit: Payer: Self-pay | Admitting: Nephrology

## 2020-06-28 DIAGNOSIS — N1832 Chronic kidney disease, stage 3b: Secondary | ICD-10-CM

## 2020-06-28 DIAGNOSIS — E1122 Type 2 diabetes mellitus with diabetic chronic kidney disease: Secondary | ICD-10-CM

## 2020-07-02 LAB — CUP PACEART REMOTE DEVICE CHECK
Date Time Interrogation Session: 20211107022845
Implantable Pulse Generator Implant Date: 20190604

## 2020-07-04 ENCOUNTER — Ambulatory Visit: Payer: Managed Care, Other (non HMO)

## 2020-07-10 ENCOUNTER — Ambulatory Visit (INDEPENDENT_AMBULATORY_CARE_PROVIDER_SITE_OTHER): Payer: Managed Care, Other (non HMO)

## 2020-07-10 DIAGNOSIS — I639 Cerebral infarction, unspecified: Secondary | ICD-10-CM | POA: Diagnosis not present

## 2020-07-11 ENCOUNTER — Ambulatory Visit: Payer: Self-pay | Admitting: Internal Medicine

## 2020-07-11 NOTE — Progress Notes (Signed)
[facility] Summary Report / Loop Recorder 

## 2020-07-13 ENCOUNTER — Other Ambulatory Visit: Payer: Self-pay | Admitting: Internal Medicine

## 2020-07-13 ENCOUNTER — Telehealth: Payer: Self-pay | Admitting: Emergency Medicine

## 2020-07-13 DIAGNOSIS — I639 Cerebral infarction, unspecified: Secondary | ICD-10-CM | POA: Insufficient documentation

## 2020-07-13 DIAGNOSIS — Z9889 Other specified postprocedural states: Secondary | ICD-10-CM | POA: Insufficient documentation

## 2020-07-13 NOTE — Telephone Encounter (Signed)
Remote monitor not connecting to [facility] system. LMOM per DPR to check monitor to ensure it is plugged in. Office hours and DC # provided.

## 2020-07-14 ENCOUNTER — Other Ambulatory Visit: Payer: Self-pay | Admitting: Internal Medicine

## 2020-07-14 ENCOUNTER — Encounter: Payer: Managed Care, Other (non HMO) | Admitting: Internal Medicine

## 2020-07-14 DIAGNOSIS — I639 Cerebral infarction, unspecified: Secondary | ICD-10-CM

## 2020-07-14 DIAGNOSIS — Z9889 Other specified postprocedural states: Secondary | ICD-10-CM

## 2020-07-15 NOTE — Telephone Encounter (Signed)
rx ok'd for lyrica #[phone] with one refill.

## 2020-07-18 ENCOUNTER — Telehealth (HOSPITAL_COMMUNITY): Payer: Self-pay | Admitting: Physician Assistant

## 2020-07-18 NOTE — Telephone Encounter (Signed)
Patient called and cancelled echocardiogram for 07/19/20 and does not wish to reschedule at this time. Order will be removed from the [facility] and if patient calls back we can reinstate the order. Thank you.

## 2020-07-19 ENCOUNTER — Other Ambulatory Visit (HOSPITAL_COMMUNITY): Payer: Managed Care, Other (non HMO)

## 2020-07-20 ENCOUNTER — Other Ambulatory Visit: Payer: Self-pay | Admitting: Internal Medicine

## 2020-07-20 DIAGNOSIS — Z8673 Personal history of transient ischemic attack (TIA), and cerebral infarction without residual deficits: Secondary | ICD-10-CM

## 2020-07-24 ENCOUNTER — Other Ambulatory Visit: Payer: Self-pay | Admitting: Physician Assistant

## 2020-08-12 ENCOUNTER — Other Ambulatory Visit: Payer: Self-pay | Admitting: Internal Medicine

## 2020-08-14 ENCOUNTER — Ambulatory Visit (INDEPENDENT_AMBULATORY_CARE_PROVIDER_SITE_OTHER): Payer: Managed Care, Other (non HMO)

## 2020-08-14 DIAGNOSIS — I639 Cerebral infarction, unspecified: Secondary | ICD-10-CM | POA: Diagnosis not present

## 2020-08-14 LAB — CUP PACEART REMOTE DEVICE CHECK
Date Time Interrogation Session: 20211218232814
Implantable Pulse Generator Implant Date: 20190604

## 2020-08-15 ENCOUNTER — Ambulatory Visit (INDEPENDENT_AMBULATORY_CARE_PROVIDER_SITE_OTHER): Payer: Managed Care, Other (non HMO) | Admitting: Internal Medicine

## 2020-08-15 ENCOUNTER — Other Ambulatory Visit: Payer: Self-pay

## 2020-08-15 ENCOUNTER — Encounter: Payer: Self-pay | Admitting: Internal Medicine

## 2020-08-15 VITALS — BP 152/68 | HR 77 | Temp 97.4°F | Ht 72.01 in | Wt 270.6 lb

## 2020-08-15 DIAGNOSIS — Z794 Long term (current) use of insulin: Secondary | ICD-10-CM | POA: Diagnosis not present

## 2020-08-15 DIAGNOSIS — E039 Hypothyroidism, unspecified: Secondary | ICD-10-CM | POA: Diagnosis not present

## 2020-08-15 DIAGNOSIS — R531 Weakness: Secondary | ICD-10-CM

## 2020-08-15 DIAGNOSIS — E11319 Type 2 diabetes mellitus with unspecified diabetic retinopathy without macular edema: Secondary | ICD-10-CM | POA: Diagnosis not present

## 2020-08-15 DIAGNOSIS — I10 Essential (primary) hypertension: Secondary | ICD-10-CM | POA: Diagnosis not present

## 2020-08-15 DIAGNOSIS — Z8673 Personal history of transient ischemic attack (TIA), and cerebral infarction without residual deficits: Secondary | ICD-10-CM

## 2020-08-15 DIAGNOSIS — D649 Anemia, unspecified: Secondary | ICD-10-CM

## 2020-08-15 DIAGNOSIS — E785 Hyperlipidemia, unspecified: Secondary | ICD-10-CM

## 2020-08-15 DIAGNOSIS — N184 Chronic kidney disease, stage 4 (severe): Secondary | ICD-10-CM

## 2020-08-15 DIAGNOSIS — G629 Polyneuropathy, unspecified: Secondary | ICD-10-CM

## 2020-08-15 LAB — CBC WITH DIFFERENTIAL/PLATELET
Basophils Absolute: 0 10*3/uL (ref 0.0–0.1)
Basophils Relative: 0.5 % (ref 0.0–3.0)
Eosinophils Absolute: 0.2 10*3/uL (ref 0.0–0.7)
Eosinophils Relative: 2.3 % (ref 0.0–5.0)
HCT: 34.4 % — ABNORMAL LOW (ref 39.0–52.0)
Hemoglobin: 11.6 g/dL — ABNORMAL LOW (ref 13.0–17.0)
Lymphocytes Relative: 12.8 % (ref 12.0–46.0)
Lymphs Abs: 1 10*3/uL (ref 0.7–4.0)
MCHC: 33.7 g/dL (ref 30.0–36.0)
MCV: 84.8 fl (ref 78.0–100.0)
Monocytes Absolute: 0.4 10*3/uL (ref 0.1–1.0)
Monocytes Relative: 5.5 % (ref 3.0–12.0)
Neutro Abs: 5.9 10*3/uL (ref 1.4–7.7)
Neutrophils Relative %: 78.9 % — ABNORMAL HIGH (ref 43.0–77.0)
Platelets: 218 10*3/uL (ref 150.0–400.0)
RBC: 4.06 Mil/uL — ABNORMAL LOW (ref 4.22–5.81)
RDW: 12.8 % (ref 11.5–15.5)
WBC: 7.5 10*3/uL (ref 4.0–10.5)

## 2020-08-15 LAB — BASIC METABOLIC PANEL
BUN: 66 mg/dL — ABNORMAL HIGH (ref 6–23)
CO2: 23 mEq/L (ref 19–32)
Calcium: 9 mg/dL (ref 8.4–10.5)
Chloride: 108 mEq/L (ref 96–112)
Creatinine, Ser: 3.52 mg/dL — ABNORMAL HIGH (ref 0.40–1.50)
GFR: 19.45 mL/min — ABNORMAL LOW (ref >60.00)
Glucose, Bld: 250 mg/dL — ABNORMAL HIGH (ref 70–99)
Potassium: 5 mEq/L (ref 3.5–5.1)
Sodium: 142 mEq/L (ref 135–145)

## 2020-08-15 LAB — LIPID PANEL
Cholesterol: 101 mg/dL (ref 0–200)
HDL: 25.1 mg/dL — ABNORMAL LOW (ref >39.00)
LDL Cholesterol: 45 mg/dL (ref 0–99)
NonHDL: 76.34
Total CHOL/HDL Ratio: 4
Triglycerides: 157 mg/dL — ABNORMAL HIGH (ref 0.0–149.0)
VLDL: 31.4 mg/dL (ref 0.0–40.0)

## 2020-08-15 LAB — HEPATIC FUNCTION PANEL
ALT: 15 U/L (ref 0–53)
AST: 18 U/L (ref 0–37)
Albumin: 4.3 g/dL (ref 3.5–5.2)
Alkaline Phosphatase: 97 U/L (ref 39–117)
Bilirubin, Direct: 0.1 mg/dL (ref 0.0–0.3)
Total Bilirubin: 0.4 mg/dL (ref 0.2–1.2)
Total Protein: 7.6 g/dL (ref 6.0–8.3)

## 2020-08-15 LAB — HEMOGLOBIN A1C: Hgb A1c MFr Bld: 6.7 % — ABNORMAL HIGH (ref 4.6–6.5)

## 2020-08-15 LAB — TSH: TSH: 11.9 u[IU]/mL — ABNORMAL HIGH (ref 0.35–4.50)

## 2020-08-15 NOTE — Progress Notes (Signed)
Patient ID: Tanyon Alipio, male   DOB: Jan 04, 1971, 49 y.o.   MRN: [id number]   Subjective:    Patient ID: Revel Stellmach, male    DOB: [birth year], 49 y.o.   MRN: [id number]  HPI This visit occurred during the SARS-CoV-2 public health emergency.  Safety protocols were in place, including screening questions prior to the visit, additional usage of staff PPE, and extensive cleaning of exam room while observing appropriate contact time as indicated for disinfecting solutions.  Patient here for a scheduled follow up.  Here to follow up regarding his blood pressure, blood sugars, neuropathy, stress and cholesterol.  Has had recent falls.  Feels unsteady at times.  Describes feeling wobbly and a "drunk" feeling.  No room spinning.  No headache.  This has been going on for a while.  States is worse since his wife passed.  Denies LOC.  Describes legs are weak.  Hard for him to walk up stairs.  Discussed PT.  He is driving now.  Agreeable to outpatient PT.  Weight is down.  Volume status is better.  No leg swelling.  On metolazone.  Only taking 2x/week.  Seeing nephrology.  AM sugars 130-140 and PM sugars 120s-138.  Taking cymbalta.  Increased stress.  Discussed.  Has support from his sister and also some from children.     Past Medical History:  Diagnosis Date  . Allergy   . Anemia   . Bell's palsy   . Diabetes mellitus without complication (HCC)    diet controlled  . Hypertension   . Hypothyroidism   . Kidney stones   . Pseudotumor cerebri   . Stroke [facility])    Past Surgical History:  Procedure Laterality Date  . COLONOSCOPY WITH PROPOFOL N/A 03/30/2020   Procedure: COLONOSCOPY WITH PROPOFOL;  Surgeon: Lesly Rubenstein, MD;  Location: [facility] ENDOSCOPY;  Service: Endoscopy;  Laterality: N/A;  . LOOP RECORDER INSERTION N/A 01/27/2018   Procedure: LOOP RECORDER INSERTION;  Surgeon: [name], MD;  Location: [place] [facility];  Service: Cardiovascular;  Laterality: N/A;  . LUMBAR PUNCTURE      as child  . NO PAST SURGERIES    . TEE WITHOUT CARDIOVERSION N/A 01/07/2018   Procedure: TRANSESOPHAGEAL ECHOCARDIOGRAM (TEE);  Surgeon: Minna Merritts, MD;  Location: [facility] ORS;  Service: Cardiovascular;  Laterality: N/A;   Family History  Problem Relation Age of Onset  . Breast cancer Mother   . Diabetes Father   . Diabetes Sister   . Arthritis Maternal Grandmother   . Diabetes Maternal Grandmother    Social History   Socioeconomic History  . Marital status: Married    Spouse name: [name]  . Number of children: 2  . Years of education: Not on file  . Highest education level: Not on file  Occupational History  . Not on file  Tobacco Use  . Smoking status: Never Smoker  . Smokeless tobacco: Never Used  Vaping Use  . Vaping Use: Never used  Substance and Sexual Activity  . Alcohol use: No    Alcohol/week: 0.0 standard drinks    Comment: very rarely (1-2/year)  . Drug use: No  . Sexual activity: Yes  Other Topics Concern  . Not on file  Social History Narrative   Lives at home with wife, independent at baseline.   Social Determinants of Health   Financial Resource Strain: Low Risk   . Difficulty of Paying Living Expenses: Not hard at all  Food Insecurity: Not on  file  Transportation Needs: Not on file  Physical Activity: Not on file  Stress: Not on file  Social Connections: Not on file    Outpatient Encounter Medications as of 08/15/2020  Medication Sig  . Accu-Chek FastClix Lancets MISC USE TO CHECK BLOOD SUGAR THREE TIMES DAILY AS DIRECTED  . amLODipine (NORVASC) 10 MG tablet Take 1 tablet (10 mg total) by mouth daily.  Marland Kitchen ascorbic acid (VITAMIN C) 500 MG tablet Take 1 tablet (500 mg total) by mouth daily.  Marland Kitchen aspirin (ASPIRIN LOW DOSE) 81 MG EC tablet TAKE 1 TABLET(81 MG) BY MOUTH DAILY  . atorvastatin (LIPITOR) 80 MG tablet TAKE 1 TABLET(80 MG) BY MOUTH DAILY  . cholecalciferol (VITAMIN D3) 25 MCG (1000 UNIT) tablet Take 1,000 Units by mouth daily.  .  clopidogrel (PLAVIX) 75 MG tablet TAKE 1 TABLET(75 MG) BY MOUTH DAILY  . cyanocobalamin (,VITAMIN B-12,) 1000 MCG/ML injection Inject 1 mL into the skin once a week for 3 weeks then once every 30 days.  . DULoxetine (CYMBALTA) 20 MG capsule Take 1 capsule (20 mg total) by mouth daily.  . furosemide (LASIX) 20 MG tablet TAKE 3 TABLETS(60 MG) BY MOUTH TWICE DAILY  . glucose blood (FREESTYLE LITE) test strip Check blood sugars twice a day (Dx. 250.02)  . HUMALOG 100 UNIT/ML injection INJECT 4 TO 6 UNITS UNDER THE SKIN THREE TIMES DAILY  . hydrALAZINE (APRESOLINE) 50 MG tablet TAKE 1 TABLET(50 MG) BY MOUTH THREE TIMES DAILY  . insulin glargine (LANTUS) 100 UNIT/ML injection ADMINISTER 15 UNITS UNDER THE SKIN EVERY NIGHT AT BEDTIME  . Insulin Syringe-Needle U-100 (INSULIN SYRINGE .5CC/30GX5/16") 30G X 5/16" 0.5 ML MISC USE AS DIRECTED WITH INSULIN  . levothyroxine (SYNTHROID) 137 MCG tablet Take 1 tablet (137 mcg total) by mouth daily before breakfast.  . losartan (COZAAR) 100 MG tablet TAKE 1 TABLET BY MOUTH DAILY  . metolazone (ZAROXOLYN) 2.5 MG tablet Take by mouth.  . Multiple Vitamin (MULTIVITAMIN WITH MINERALS) TABS tablet Take 1 tablet by mouth daily.  . potassium chloride SA (KLOR-CON) 20 MEQ tablet Take by mouth.  . pregabalin (LYRICA) 100 MG capsule TAKE 1 CAPSULE(100 MG) BY MOUTH THREE TIMES DAILY  . Semaglutide, 1 MG/DOSE, (OZEMPIC, 1 MG/DOSE,) 4 MG/3ML SOPN Inject 0.75 mLs (1 mg total) into the skin once a week.  . Syringe/Needle, Disp, (SYRINGE 3CC/25GX1") 25G X 1" 3 ML MISC Use as directed to administer b12 injections.  Marland Kitchen zinc gluconate 50 MG tablet Take by mouth.  . zinc sulfate 220 (50 Zn) MG capsule Take 1 capsule (220 mg total) by mouth daily.   No facility-administered encounter medications on file as of 08/15/2020.    Review of Systems  Constitutional: Negative for appetite change.       Weight is down with diuresis.   HENT: Negative for congestion and sinus pressure.    Respiratory: Negative for cough and chest tightness.        Breathing stable.   Cardiovascular: Negative for chest pain and palpitations.       Leg swelling much improved.    Gastrointestinal: Negative for abdominal pain, diarrhea, nausea and vomiting.  Genitourinary: Negative for difficulty urinating and dysuria.  Musculoskeletal: Negative for joint swelling and myalgias.  Skin: Negative for color change and rash.  Neurological: Positive for dizziness. Negative for headaches.       Unsteady.    Psychiatric/Behavioral: Negative for agitation and dysphoric mood.       Increased stress related to loss of his wife.  Objective:    Physical Exam Vitals reviewed.  Constitutional:      General: He is not in acute distress.    Appearance: Normal appearance. He is well-developed and well-nourished.  HENT:     Head: Normocephalic and atraumatic.     Right Ear: External ear normal.     Left Ear: External ear normal.  Eyes:     General: No scleral icterus.       Right eye: No discharge.        Left eye: No discharge.     Conjunctiva/sclera: Conjunctivae normal.  Cardiovascular:     Rate and Rhythm: Normal rate and regular rhythm.  Pulmonary:     Effort: Pulmonary effort is normal. No respiratory distress.     Breath sounds: Normal breath sounds.  Abdominal:     General: Bowel sounds are normal.     Palpations: Abdomen is soft.     Tenderness: There is no abdominal tenderness.  Musculoskeletal:        General: No swelling, tenderness or edema.  Skin:    Findings: No erythema or rash.  Neurological:     Mental Status: He is alert.  Psychiatric:        Mood and Affect: Mood and affect and mood normal.        Behavior: Behavior normal.     BP (!) 152/68 (BP Location: Left Arm, Patient Position: Sitting)   Pulse 77   Temp (!) 97.4 F (36.3 C)   Ht 6' 0.01" (1.829 m)   Wt 270 lb 9.6 oz (122.7 kg)   SpO2 99%   BMI 36.69 kg/m    Wt Readings from Last 3 Encounters:   08/15/20 270 lb 9.6 oz (122.7 kg)  06/27/20 (!) 327 lb 3.2 oz (148.4 kg)  06/06/20 (!) 315 lb 9.6 oz (143.2 kg)     Lab Results  Component Value Date   WBC 7.5 08/15/2020   HGB 11.6 (L) 08/15/2020   HCT 34.4 (L) 08/15/2020   PLT 218.0 08/15/2020   GLUCOSE 250 (H) 08/15/2020   CHOL 101 08/15/2020   TRIG 157.0 (H) 08/15/2020   HDL 25.10 (L) 08/15/2020   LDLCALC 45 08/15/2020   ALT 15 08/15/2020   AST 18 08/15/2020   NA 142 08/15/2020   K 5.0 08/15/2020   CL 108 08/15/2020   CREATININE 3.52 (H) 08/15/2020   BUN 66 (H) 08/15/2020   CO2 23 08/15/2020   TSH 11.90 (H) 08/15/2020   INR 0.85 04/09/2018   HGBA1C 6.7 (H) 08/15/2020       Assessment & Plan:   Problem List Items Addressed This Visit    Diabetes (HCC)    Previously poorly controlled.  Discussed diet and exercise.  Weight is down.  Sugars appear to be doing better.  Follow met b and a1c.  Continues on ozempic and insulin.        Relevant Orders   Hemoglobin A1c (Completed)   Basic metabolic panel (Completed)   Hypothyroidism    On thyroid replacement.  Follow tsh.  Has been non compliant previously.        Relevant Orders   TSH (Completed)   Hyperlipidemia    On lipitor.  Low cholesterol diet and exercise.  Follow lipid panel and liver function tests.        Relevant Medications   metolazone (ZAROXOLYN) 2.5 MG tablet   Other Relevant Orders   CBC with Differential/Platelet (Completed)   Hepatic function panel (Completed)   Lipid  panel (Completed)   Neuropathy    Unsteady gait.  With neuropathy. On lyrica.  Will decrease to bid to make sure this is not contributing to his dizziness.  Follow.        Relevant Orders   Ambulatory referral to Physical Therapy   Essential hypertension - Primary    Continue on amlodipine, hydralazine, losartan and metolazone.  Follow pressures.  Follow metabolic panel.       Relevant Medications   metolazone (ZAROXOLYN) 2.5 MG tablet   History of CVA (cerebrovascular  accident)    Continue plavix.  Continue risk factor modification.       Relevant Orders   Ambulatory referral to Physical Therapy   Anemia    Recheck cbc.  Had discussed referral to hematology.        CKD (chronic kidney disease) stage 4, GFR 15-29 ml/min (HCC)    Avoid antiinflammatories.  Seeing nephrology.  Follow metabolic panel.        Weakness    Weakness/unsteadiness as outlined.  With neuropathy.  Recent CVA and covid pneumonia.  Is doing better.  Still with unsteady gait, weakness, falls, etc.  Refer to PT for evaluation and treatment.        Relevant Orders   Ambulatory referral to Physical Therapy       [name], MD

## 2020-08-16 ENCOUNTER — Telehealth: Payer: Self-pay | Admitting: Internal Medicine

## 2020-08-16 NOTE — Telephone Encounter (Signed)
Patient was returning call for his lab results

## 2020-08-16 NOTE — Telephone Encounter (Signed)
See result note.  

## 2020-08-22 ENCOUNTER — Ambulatory Visit: Payer: Self-pay | Admitting: Internal Medicine

## 2020-08-22 ENCOUNTER — Encounter: Payer: Self-pay | Admitting: Internal Medicine

## 2020-08-22 NOTE — Assessment & Plan Note (Signed)
Recheck cbc.  Had discussed referral to hematology.

## 2020-08-22 NOTE — Assessment & Plan Note (Signed)
Unsteady gait.  With neuropathy. On lyrica.  Will decrease to bid to make sure this is not contributing to his dizziness.  Follow.

## 2020-08-22 NOTE — Assessment & Plan Note (Signed)
On lipitor.  Low cholesterol diet and exercise.  Follow lipid panel and liver function tests.   

## 2020-08-22 NOTE — Assessment & Plan Note (Signed)
On thyroid replacement.  Follow tsh.  Has been non compliant previously.

## 2020-08-22 NOTE — Assessment & Plan Note (Signed)
Weakness/unsteadiness as outlined.  With neuropathy.  Recent CVA and covid pneumonia.  Is doing better.  Still with unsteady gait, weakness, falls, etc.  Refer to PT for evaluation and treatment.

## 2020-08-22 NOTE — Assessment & Plan Note (Signed)
Continue on amlodipine, hydralazine, losartan and metolazone.  Follow pressures.  Follow metabolic panel.

## 2020-08-22 NOTE — Assessment & Plan Note (Addendum)
Previously poorly controlled.  Discussed diet and exercise.  Weight is down.  Sugars appear to be doing better.  Follow met b and a1c.  Continues on ozempic and insulin.

## 2020-08-22 NOTE — Assessment & Plan Note (Signed)
Avoid antiinflammatories.  Seeing nephrology.  Follow metabolic panel.

## 2020-08-22 NOTE — Assessment & Plan Note (Signed)
Continue plavix.  Continue risk factor modification.

## 2020-08-24 ENCOUNTER — Other Ambulatory Visit: Payer: Self-pay | Admitting: Internal Medicine

## 2020-08-24 MED ORDER — LEVOTHYROXINE SODIUM 150 MCG PO TABS: 150.0000 ug | ORAL_TABLET | Freq: Every day | ORAL | 2 refills | Status: DC

## 2020-08-24 NOTE — Progress Notes (Signed)
Unable to leave message for patient to return call back for lab results, phone kept ringing.

## 2020-08-24 NOTE — Progress Notes (Signed)
rx sent in for synthroid 120mcg q day. #30 with 2 refills.

## 2020-08-24 NOTE — Progress Notes (Signed)
[facility] Summary Report / Loop Recorder 

## 2020-08-30 ENCOUNTER — Telehealth: Payer: Self-pay | Admitting: Internal Medicine

## 2020-08-30 ENCOUNTER — Other Ambulatory Visit: Payer: Self-pay | Admitting: Internal Medicine

## 2020-08-30 NOTE — Telephone Encounter (Signed)
Please notify pt that I reviewed the note from his nephrology visit.  His nephrologist recommended continued the lasix (20mg ) daily.  Take metalozone 2.5mg  one time per week.  Also recommended ferrous sulfate 325mg  tid.  This is the regimen they recommended.  Please confirm this is what he is taking.  Thanks

## 2020-08-30 NOTE — Telephone Encounter (Signed)
Patient was returning Dr.[name]'s call about his medication

## 2020-08-31 NOTE — Telephone Encounter (Signed)
LMTCB

## 2020-09-01 ENCOUNTER — Telehealth: Payer: Self-pay | Admitting: Pharmacist

## 2020-09-01 ENCOUNTER — Telehealth: Payer: Self-pay

## 2020-09-01 NOTE — Telephone Encounter (Signed)
  Chronic Care Management   Note  09/01/2020 Name: Gregory Crane MRN: [id number] DOB: 1970-09-24   Attempted to contact patient for scheduled appointment for medication management support. Left HIPAA compliant message for patient to return my call at their convenience.    Plan: - If I do not hear back from the patient by end of business today, will collaborate with Care Guide to outreach to schedule follow up with me   Catie [name], PharmD, [place], [place] Pharmacist [employer] [facility] [employer] [phone]

## 2020-09-04 ENCOUNTER — Telehealth: Payer: Self-pay

## 2020-09-04 NOTE — Telephone Encounter (Signed)
LMTCB

## 2020-09-05 NOTE — Telephone Encounter (Signed)
LMTCB

## 2020-09-07 ENCOUNTER — Telehealth: Payer: Self-pay

## 2020-09-07 NOTE — Telephone Encounter (Signed)
Patient called due to having abdominal pain x 1 day. Pain is RUQ, tenderness upon touching,chills, severe nausea, hard to sit up and get up, patient is very weak.  No yellow of the skin or eyes per patient, fever, abnormal BM, and nothing is helping sx. No medication for sx. Patient hasn't eaten anything past two days due to sx. He has been sipping on sprite and water. Due to Patient sx instructed patient to go to ED to be evaluated, he stated he would comply. He stated he will go to [facility] ed or [facility] ED.

## 2020-09-07 NOTE — Telephone Encounter (Signed)
Agree with evaluation now given symptoms.    Dr Nicki Reaper

## 2020-09-07 NOTE — Telephone Encounter (Signed)
Patient has been rescheduled.

## 2020-09-08 NOTE — Telephone Encounter (Signed)
Pt said he went to [facility] yesterday and they did an ultrasound. He said that it had been 8 hours so he left and didn't get the results. He wants to know if Dr. Nicki Reaper can see the results and call him in an antibiotic?

## 2020-09-08 NOTE — Telephone Encounter (Signed)
LMTCB

## 2020-09-09 DIAGNOSIS — E119 Type 2 diabetes mellitus without complications: Secondary | ICD-10-CM | POA: Insufficient documentation

## 2020-09-09 DIAGNOSIS — K810 Acute cholecystitis: Secondary | ICD-10-CM | POA: Insufficient documentation

## 2020-09-12 NOTE — Telephone Encounter (Signed)
Pt admitted to hospital  

## 2020-09-12 NOTE — Telephone Encounter (Signed)
Patient is admitted to hospital

## 2020-09-17 DIAGNOSIS — N184 Chronic kidney disease, stage 4 (severe): Secondary | ICD-10-CM | POA: Insufficient documentation

## 2020-09-17 DIAGNOSIS — N179 Acute kidney failure, unspecified: Secondary | ICD-10-CM | POA: Insufficient documentation

## 2020-09-17 LAB — CUP PACEART REMOTE DEVICE CHECK
Date Time Interrogation Session: 20220121005526
Implantable Pulse Generator Implant Date: 20190604

## 2020-09-18 ENCOUNTER — Ambulatory Visit (INDEPENDENT_AMBULATORY_CARE_PROVIDER_SITE_OTHER): Payer: Managed Care, Other (non HMO)

## 2020-09-18 ENCOUNTER — Telehealth: Payer: Self-pay

## 2020-09-18 DIAGNOSIS — I639 Cerebral infarction, unspecified: Secondary | ICD-10-CM

## 2020-09-18 NOTE — Telephone Encounter (Signed)
[facility] called and scheduled hospital follow up with Dr. Nicki Reaper on 09/26/20 @ 9:30am

## 2020-09-18 NOTE — Telephone Encounter (Signed)
Noted. Will follow.  

## 2020-09-19 ENCOUNTER — Telehealth: Payer: Self-pay

## 2020-09-19 DIAGNOSIS — K91840 Postprocedural hemorrhage and hematoma of a digestive system organ or structure following a digestive system procedure: Secondary | ICD-10-CM | POA: Insufficient documentation

## 2020-09-19 NOTE — Telephone Encounter (Signed)
Transition Care Management Unsuccessful Follow-up Telephone Call  Date of discharge and from where:  09/18/20 from [facility]  Attempts:  1st Attempt  Reason for unsuccessful TCM follow-up call:  Unable to reach patient  HFU scheduled 09/26/20 @ 9:30. Will follow.

## 2020-09-20 NOTE — Telephone Encounter (Signed)
Transition Care Management Follow-up Telephone Call  Date of discharge and from where: 09/18/20 from [place]  How have you been since you were released from the hospital? I am doing okay but returned to ED yesterday; no further problems. I am a little weak but resting well. Denies n/v/d, fever, chills, abd pain and all other symptoms. Paces self with walking. Input/output appropriate. BS monitored; none to report today.    Any questions or concerns? No  Items Reviewed:  Did the pt receive and understand the discharge instructions provided? Yes , no lifting heavy items. Rest.   Medications obtained and verified? Yes   Other? No   Any new allergies since your discharge? No   Dietary orders reviewed? Yes, regular. Avoid greasy foods.   Do you have support at home? Yes   Home Care and Equipment/Supplies: Were home health services ordered? No  Functional Questionnaire: (I = Independent and D = Dependent) ADLs: Paces self with all activities  Bathing/Dressing- i  Meal Prep- Family assist  Eating- i  Maintaining continence- Has walker if needed.   Transferring/Ambulation- i  Managing Meds- i  Follow up appointments reviewed:   PCP Hospital f/u appt confirmed? Yes  Scheduled to see Dr.[name] on 09/26/20 @ 9:30.  [facility] f/u appt confirmed? Yes  Scheduled to see Cardiology on 10/25/20. Nephrology scheduled 09/27/20.   Are transportation arrangements needed? No   If their condition worsens, is the pt aware to call PCP or go to the Emergency Dept.? Yes  Was the patient provided with contact information for the PCP's office or ED? Yes  Was to pt encouraged to call back with questions or concerns? Yes

## 2020-09-26 ENCOUNTER — Telehealth (INDEPENDENT_AMBULATORY_CARE_PROVIDER_SITE_OTHER): Payer: Managed Care, Other (non HMO) | Admitting: Internal Medicine

## 2020-09-26 ENCOUNTER — Encounter: Payer: Self-pay | Admitting: Internal Medicine

## 2020-09-26 DIAGNOSIS — R531 Weakness: Secondary | ICD-10-CM | POA: Diagnosis not present

## 2020-09-26 DIAGNOSIS — D649 Anemia, unspecified: Secondary | ICD-10-CM

## 2020-09-26 DIAGNOSIS — N184 Chronic kidney disease, stage 4 (severe): Secondary | ICD-10-CM

## 2020-09-26 DIAGNOSIS — Z794 Long term (current) use of insulin: Secondary | ICD-10-CM

## 2020-09-26 DIAGNOSIS — J1282 Pneumonia due to coronavirus disease 2019: Secondary | ICD-10-CM

## 2020-09-26 DIAGNOSIS — E039 Hypothyroidism, unspecified: Secondary | ICD-10-CM

## 2020-09-26 DIAGNOSIS — E11319 Type 2 diabetes mellitus with unspecified diabetic retinopathy without macular edema: Secondary | ICD-10-CM

## 2020-09-26 DIAGNOSIS — E785 Hyperlipidemia, unspecified: Secondary | ICD-10-CM

## 2020-09-26 DIAGNOSIS — G629 Polyneuropathy, unspecified: Secondary | ICD-10-CM

## 2020-09-26 DIAGNOSIS — Z8673 Personal history of transient ischemic attack (TIA), and cerebral infarction without residual deficits: Secondary | ICD-10-CM

## 2020-09-26 DIAGNOSIS — U071 COVID-19: Secondary | ICD-10-CM

## 2020-09-26 DIAGNOSIS — I10 Essential (primary) hypertension: Secondary | ICD-10-CM

## 2020-09-26 NOTE — Progress Notes (Signed)
Patient ID: Gregory Crane, male   DOB: Dec 19, 1970, 50 y.o.   MRN: [id number]   Virtual Visit via video Note  This visit type was conducted due to national recommendations for restrictions regarding the COVID-19 pandemic (e.g. social distancing).  This format is felt to be most appropriate for this patient at this time.  All issues noted in this document were discussed and addressed.  No physical exam was performed (except for noted visual exam findings with Video Visits).   I connected with Tyran Huser by a video enabled telemedicine application and verified that I am speaking with the correct person using two identifiers. Location patient: home Location provider: work  Persons participating in the virtual visit: patient, provider  The limitations, risks, security and privacy concerns of performing an evaluation and management service by video and the availability of in person appointments have been discussed.  It has also been discussed with the patient that there may be a patient responsible charge related to this service. The patient expressed understanding and agreed to proceed.   Reason for visit: hospital follow up  HPI: Admitted 09/09/20 - 09/18/20 - with RUQ pain and nausea.  Reviewed notes, scans and labs.  Ultrasound - equivocal for gallbladder pathology with thickened gallbladder wall at 84m and moderately distended gallbladder.  Left and returned due to persistent RUQ pain and nausea.  Was given IVFs, cipro and flagyl.  CT - distended gallbladder, marked gallbladder wall thickening and inflammatory stranding around the gallbladder.  Creatinine on presentation - up to 4.6.  White count elevated.  Taken to OR - 119/22.  S/p cholecystectomy. Surgical drain removed.  Discharged.   On 09/19/20 - to ER.  Had a fall and noticed bleeding from the skin around his prior surgical drain site.  Found to have slow ooze from skin vessel.  Pressure held.  Silver nitrate applied.  CT - no acute  intraabdominal processes.  plavix held for 3 days.  Dressing applied.  Since discharge, he denies any abdominal pain.  No nausea or vomiting.  Some loose stool.  No persistent diarrhea.  Eating.  Sugars ok.  Blood sugars - 130s.  No lows. Using SSI.  No chest pain.  Breathing stable.  Taking lasix.  Blood pressure doing better.  Feels better.  Increased stress.  Seeing a cSocial worker  Doing better.    ROS: See pertinent positives and negatives per HPI.  Past Medical History:  Diagnosis Date  . Allergy   . Anemia   . Bell's palsy   . Diabetes mellitus without complication (HCC)    diet controlled  . Hypertension   . Hypothyroidism   . Kidney stones   . Pseudotumor cerebri   . Stroke ([facility]     Past Surgical History:  Procedure Laterality Date  . COLONOSCOPY WITH PROPOFOL N/A 03/30/2020   Procedure: COLONOSCOPY WITH PROPOFOL;  Surgeon: LLesly Rubenstein MD;  Location: [facility] ENDOSCOPY;  Service: Endoscopy;  Laterality: N/A;  . LOOP RECORDER INSERTION N/A 01/27/2018   Procedure: LOOP RECORDER INSERTION;  Surgeon: KDeboraha Sprang MD;  [facility];  Service: Cardiovascular;  Laterality: N/A;  . LUMBAR PUNCTURE     as child  . NO PAST SURGERIES    . TEE WITHOUT CARDIOVERSION N/A 01/07/2018   Procedure: TRANSESOPHAGEAL ECHOCARDIOGRAM (TEE);  Surgeon: GMinna Merritts MD;  Location: [facility] ORS;  Service: Cardiovascular;  Laterality: N/A;    Family History  Problem Relation Age of Onset  . Breast cancer Mother   .  Diabetes Father   . Diabetes Sister   . Arthritis Maternal Grandmother   . Diabetes Maternal Grandmother     SOCIAL HX: reviewed.    Current Outpatient Medications:  .  Accu-Chek FastClix Lancets MISC, USE TO CHECK BLOOD SUGAR THREE TIMES DAILY AS DIRECTED, Disp: 306 each, Rfl: 100 .  amLODipine (NORVASC) 10 MG tablet, Take 1 tablet (10 mg total) by mouth daily., Disp: 90 tablet, Rfl: 1 .  ascorbic acid (VITAMIN C) 500 MG tablet, Take 1 tablet (500 mg  total) by mouth daily., Disp: 90 tablet, Rfl: 0 .  aspirin (ASPIRIN LOW DOSE) 81 MG EC tablet, TAKE 1 TABLET(81 MG) BY MOUTH DAILY, Disp: 90 tablet, Rfl: 1 .  atorvastatin (LIPITOR) 80 MG tablet, TAKE 1 TABLET(80 MG) BY MOUTH DAILY, Disp: 90 tablet, Rfl: 0 .  cholecalciferol (VITAMIN D3) 25 MCG (1000 UNIT) tablet, Take 1,000 Units by mouth daily., Disp: , Rfl:  .  clopidogrel (PLAVIX) 75 MG tablet, TAKE 1 TABLET(75 MG) BY MOUTH DAILY, Disp: 90 tablet, Rfl: 2 .  cyanocobalamin (,VITAMIN B-12,) 1000 MCG/ML injection, Inject 1 mL into the skin once a week for 3 weeks then once every 30 days., Disp: 10 mL, Rfl: 0 .  DULoxetine (CYMBALTA) 20 MG capsule, TAKE 1 CAPSULE(20 MG) BY MOUTH DAILY, Disp: 30 capsule, Rfl: 1 .  furosemide (LASIX) 20 MG tablet, TAKE 3 TABLETS(60 MG) BY MOUTH TWICE DAILY, Disp: 540 tablet, Rfl: 3 .  glucose blood (FREESTYLE LITE) test strip, Check blood sugars twice a day (Dx. 250.02), Disp: 200 each, Rfl: 1 .  HUMALOG 100 UNIT/ML injection, INJECT 4 TO 6 UNITS UNDER THE SKIN THREE TIMES DAILY, Disp: 30 mL, Rfl: 0 .  hydrALAZINE (APRESOLINE) 50 MG tablet, TAKE 1 TABLET(50 MG) BY MOUTH THREE TIMES DAILY, Disp: 90 tablet, Rfl: 2 .  insulin glargine (LANTUS) 100 UNIT/ML injection, ADMINISTER 15 UNITS UNDER THE SKIN EVERY NIGHT AT BEDTIME, Disp: 30 mL, Rfl: 0 .  Insulin Syringe-Needle U-100 (INSULIN SYRINGE .5CC/30GX5/16") 30G X 5/16" 0.5 ML MISC, USE AS DIRECTED WITH INSULIN, Disp: 300 each, Rfl: 5 .  levothyroxine (SYNTHROID) 150 MCG tablet, Take 1 tablet (150 mcg total) by mouth daily before breakfast., Disp: 30 tablet, Rfl: 2 .  losartan (COZAAR) 100 MG tablet, TAKE 1 TABLET BY MOUTH DAILY, Disp: 90 tablet, Rfl: 1 .  metolazone (ZAROXOLYN) 2.5 MG tablet, Take by mouth., Disp: , Rfl:  .  Multiple Vitamin (MULTIVITAMIN WITH MINERALS) TABS tablet, Take 1 tablet by mouth daily., Disp: 90 tablet, Rfl: 0 .  potassium chloride SA (KLOR-CON) 20 MEQ tablet, Take by mouth., Disp: , Rfl:  .   pregabalin (LYRICA) 100 MG capsule, TAKE 1 CAPSULE(100 MG) BY MOUTH THREE TIMES DAILY, Disp: 270 capsule, Rfl: 1 .  Semaglutide, 1 MG/DOSE, (OZEMPIC, 1 MG/DOSE,) 4 MG/3ML SOPN, Inject 0.75 mLs (1 mg total) into the skin once a week., Disp: 9 mL, Rfl: 3 .  Syringe/Needle, Disp, (SYRINGE 3CC/25GX1") 25G X 1" 3 ML MISC, Use as directed to administer b12 injections., Disp: 50 each, Rfl: 1 .  zinc gluconate 50 MG tablet, Take by mouth., Disp: , Rfl:  .  zinc sulfate 220 (50 Zn) MG capsule, Take 1 capsule (220 mg total) by mouth daily., Disp: 90 capsule, Rfl: 0  EXAM:  VITALS per patient if applicable: 785Y/85O  GENERAL: alert, oriented, appears well and in no acute distress  HEENT: atraumatic, conjunttiva clear, no obvious abnormalities on inspection of external nose and ears  NECK: normal movements  of the head and neck  LUNGS: on inspection no signs of respiratory distress, breathing rate appears normal, no obvious gross SOB, gasping or wheezing  CV: no obvious cyanosis  PSYCH/NEURO: pleasant and cooperative, no obvious depression or anxiety, speech and thought processing grossly intact  ASSESSMENT AND PLAN:  Discussed the following assessment and plan:  Problem List Items Addressed This Visit    Anemia    Recheck cbc.  Had previously discussed referral to hematology.  Persistent decrease.  Recent hgb 8.6.  Schedule f/u hematology.        CKD (chronic kidney disease) stage 4, GFR 15-29 ml/min (HCC)    Seeing nephrology.  Avoid antiinflammatories.        Diabetes ([place])    Sugars as outlined.  Overall improved.  Continues SSI and lantus as directed.  Follow met b and a1c.       Essential hypertension    Continues on amlodipine, hydralazine and losartan.  Taking lasix daily.  Follow pressures.  Follow metabolic panel.       History of CVA (cerebrovascular accident)    Continue plavix.        Hyperlipidemia    On lipitor.  Low cholesterol diet and exercise.  Follow lipid panel  and liver function tests.        Hypothyroidism    On thyroid replacement.  Follow tsh.       Neuropathy    Unsteady gait with neuropathy. On lyrica.  Follow. Had referred to PT.  Has not been scheduled.  F/u and schedule PT to evaluate and treat.       Pneumonia due to COVID-19 virus    Recent hospitalization.  Off oxygen.  Breathing is stable.  Needs f/u cxr to confirm clear.        Weakness    With neuropathy.  Weakness and unsteadiness.  Recent CVA and covid pneumonia.  Had referred to PT. Never scheduled.  F/u with getting PT arranged.            I discussed the assessment and treatment plan with the patient. The patient was provided an opportunity to ask questions and all were answered. The patient agreed with the plan and demonstrated an understanding of the instructions.   The patient was advised to call back or seek an in-person evaluation if the symptoms worsen or if the condition fails to improve as anticipated.   [name], MD

## 2020-09-28 NOTE — Progress Notes (Signed)
[facility] Summary Report / Loop Recorder 

## 2020-10-01 ENCOUNTER — Telehealth: Payer: Self-pay | Admitting: Internal Medicine

## 2020-10-01 ENCOUNTER — Encounter: Payer: Self-pay | Admitting: Internal Medicine

## 2020-10-01 NOTE — Telephone Encounter (Signed)
Had referred pt to PT previously - referral 08/22/20.  States he never heard about scheduling an appt.  Pt with history of CVA and severe neuropathy.  Is unsteady and weak. Needs PT to evaluate and treat.  Order is in.  Needs appt.  Let me know if I need to do anything more.   Thanks  Dr Nicki Reaper

## 2020-10-01 NOTE — Assessment & Plan Note (Signed)
Recent hospitalization.  Off oxygen.  Breathing is stable.  Needs f/u cxr to confirm clear.

## 2020-10-01 NOTE — Assessment & Plan Note (Signed)
With neuropathy.  Weakness and unsteadiness.  Recent CVA and covid pneumonia.  Had referred to PT. Never scheduled.  F/u with getting PT arranged.

## 2020-10-01 NOTE — Assessment & Plan Note (Addendum)
Unsteady gait with neuropathy. On lyrica.  Follow. Had referred to PT.  Has not been scheduled.  F/u and schedule PT to evaluate and treat.

## 2020-10-01 NOTE — Assessment & Plan Note (Signed)
Sugars as outlined.  Overall improved.  Continues SSI and lantus as directed.  Follow met b and a1c.

## 2020-10-01 NOTE — Assessment & Plan Note (Addendum)
Recheck cbc.  Had previously discussed referral to hematology.  Persistent decrease.  Recent hgb 8.6.  Schedule f/u hematology.

## 2020-10-01 NOTE — Assessment & Plan Note (Signed)
On thyroid replacement.  Follow tsh.  

## 2020-10-01 NOTE — Assessment & Plan Note (Signed)
Seeing nephrology.  Avoid antiinflammatories.

## 2020-10-01 NOTE — Assessment & Plan Note (Signed)
Continue plavix °

## 2020-10-01 NOTE — Assessment & Plan Note (Signed)
On lipitor.  Low cholesterol diet and exercise.  Follow lipid panel and liver function tests.   

## 2020-10-01 NOTE — Assessment & Plan Note (Signed)
Continues on amlodipine, hydralazine and losartan.  Taking lasix daily.  Follow pressures.  Follow metabolic panel.

## 2020-10-02 ENCOUNTER — Telehealth: Payer: Self-pay | Admitting: Internal Medicine

## 2020-10-02 NOTE — Telephone Encounter (Signed)
lft vm at [facility] pt to follow up on pt referral.

## 2020-10-02 NOTE — Telephone Encounter (Signed)
Good monring!  I called [place] [facility] and left a vm for someone to return my call regarding referral. Thank you for letting me know.

## 2020-10-02 NOTE — Telephone Encounter (Signed)
Yw! 

## 2020-10-02 NOTE — Telephone Encounter (Signed)
Thank you for the followup.

## 2020-10-06 ENCOUNTER — Ambulatory Visit: Payer: Managed Care, Other (non HMO) | Admitting: Pharmacist

## 2020-10-06 DIAGNOSIS — I10 Essential (primary) hypertension: Secondary | ICD-10-CM

## 2020-10-06 DIAGNOSIS — Z8673 Personal history of transient ischemic attack (TIA), and cerebral infarction without residual deficits: Secondary | ICD-10-CM

## 2020-10-06 DIAGNOSIS — E039 Hypothyroidism, unspecified: Secondary | ICD-10-CM

## 2020-10-06 DIAGNOSIS — E785 Hyperlipidemia, unspecified: Secondary | ICD-10-CM

## 2020-10-06 DIAGNOSIS — N184 Chronic kidney disease, stage 4 (severe): Secondary | ICD-10-CM

## 2020-10-06 DIAGNOSIS — E11319 Type 2 diabetes mellitus with unspecified diabetic retinopathy without macular edema: Secondary | ICD-10-CM

## 2020-10-06 MED ORDER — FREESTYLE LIBRE 2 SENSOR MISC: 11 refills | Status: DC

## 2020-10-06 NOTE — Chronic Care Management (AMB) (Signed)
Care Management   Pharmacy Note  10/06/2020 Name: Gregory Crane MRN: [id number] DOB: 1970-09-18  Subjective: Gregory Crane is a 50 y.o. year old male who is a primary care patient of [name], MD. The Care Management team was consulted for assistance with care management and care coordination needs.    Engaged with patient by telephone for follow up visit in response to provider referral for pharmacy case management and/or care coordination services.   The patient was given information about Care Management services today including:  1. Care Management services includes personalized support from designated clinical staff supervised by the patient's primary care provider, including individualized plan of care and coordination with other care providers. 2. 24/7 contact phone numbers for assistance for urgent and routine care needs. 3. The patient may stop case management services at any time by phone call to the office staff.  Patient agreed to services and consent obtained.  Assessment:  Review of patient status, including review of consultants reports, laboratory and other test data, was performed as part of comprehensive evaluation and provision of chronic care management services.   SDOH (Social Determinants of Health) assessments and interventions performed:  SDOH Interventions   Flowsheet Row Most Recent Value  SDOH Interventions   SDOH Interventions for the Following Domains Depression  Financial Strain Interventions Intervention Not Indicated  Depression Interventions/Treatment  Currently on Treatment       Objective:  Lab Results  Component Value Date   CREATININE 3.52 (H) 08/15/2020   CREATININE 2.45 (H) 05/16/2020   CREATININE 2.41 (H) 04/24/2020    Lab Results  Component Value Date   HGBA1C 6.7 (H) 08/15/2020       Component Value Date/Time   CHOL 101 08/15/2020 1221   CHOL 109 03/28/2020 1202   TRIG 157.0 (H) 08/15/2020 1221   HDL 25.10 (L)  08/15/2020 1221   HDL 31 (L) 03/28/2020 1202   CHOLHDL 4 08/15/2020 1221   VLDL 31.4 08/15/2020 1221   LDLCALC 45 08/15/2020 1221   LDLCALC 60 03/28/2020 1202     BP Readings from Last 3 Encounters:  09/26/20 130/65  08/15/20 (!) 152/68  06/27/20 (!) 150/80    Care Plan  No Known Allergies  Medications Reviewed Today    Reviewed by De Hollingshead, RPH-CPP (Pharmacist) on 10/06/20 at 1541  Med List Status: <None>  Medication Order Taking? Sig Documenting Provider Last Dose Status Informant  Accu-Chek FastClix Lancets MISC [id number] Yes USE TO CHECK BLOOD SUGAR THREE TIMES DAILY AS DIRECTED Leone Haven, MD Taking Active   amLODipine (NORVASC) 10 MG tablet [id number] Yes Take 1 tablet (10 mg total) by mouth daily. [name], MD Taking Active   ascorbic acid (VITAMIN C) 500 MG tablet [id number] Yes Take 1 tablet (500 mg total) by mouth daily. [name], MD Taking Active   aspirin (ASPIRIN LOW DOSE) 81 MG EC tablet [id number] Yes TAKE 1 TABLET(81 MG) BY MOUTH DAILY [name], MD Taking Active   atorvastatin (LIPITOR) 80 MG tablet [id number] Yes TAKE 1 TABLET(80 MG) BY MOUTH DAILY [name], MD Taking Active   cholecalciferol (VITAMIN D3) 25 MCG (1000 UNIT) tablet [id number] Yes Take 1,000 Units by mouth daily. [provider] Taking Active   clopidogrel (PLAVIX) 75 MG tablet [id number] Yes TAKE 1 TABLET(75 MG) BY MOUTH DAILY [name], MD Taking Active   cyanocobalamin (,VITAMIN B-12,) 1000 MCG/ML injection [id number] No Inject 1 mL into the skin once a week for 3 weeks then once  every 30 days.  Patient not taking: Reported on 10/06/2020   [name], MD Not Taking Active   DULoxetine (CYMBALTA) 20 MG capsule [id number] Yes TAKE 1 CAPSULE(20 MG) BY MOUTH DAILY [name], MD Taking Active   furosemide (LASIX) 20 MG tablet [id number] Yes TAKE 3 TABLETS(60 MG) BY MOUTH TWICE DAILY [name], MD Taking Active            Med Note  De Hollingshead   Fri Oct 06, 2020  3:38 PM) 60 units QAM  glucose blood (FREESTYLE LITE) test strip [id number] Yes Check blood sugars twice a day (Dx. 250.02) [name], MD Taking Active Spouse/Significant Other  HUMALOG 100 UNIT/ML injection [id number] Yes INJECT 4 TO 6 UNITS UNDER THE SKIN THREE TIMES DAILY [name], MD Taking Active            Med Note De Hollingshead   Fri Oct 06, 2020  3:36 PM) 5-10 units   hydrALAZINE (APRESOLINE) 50 MG tablet [id number] Yes TAKE 1 TABLET(50 MG) BY MOUTH THREE TIMES DAILY [name], MD Taking Active   insulin glargine (LANTUS) 100 UNIT/ML injection [id number] Yes ADMINISTER 15 UNITS UNDER THE SKIN EVERY NIGHT AT BEDTIME [name], MD Taking Active            Med Note De Hollingshead   Fri Oct 06, 2020  3:37 PM) Taking 5-10  Insulin Syringe-Needle U-100 (INSULIN SYRINGE .5CC/30GX5/16") 30G X 5/16" 0.5 ML MISC [id number] Yes USE AS DIRECTED WITH INSULIN [name], MD Taking Active   levothyroxine (SYNTHROID) 150 MCG tablet [id number] Yes Take 1 tablet (150 mcg total) by mouth daily before breakfast. [name], MD Taking Active   losartan (COZAAR) 100 MG tablet [id number] Yes TAKE 1 TABLET BY MOUTH DAILY [name], MD Taking Active   metolazone (ZAROXOLYN) 2.5 MG tablet [id number] Yes Take 2.5 mg by mouth. Twice weekly [provider] Taking Active   potassium chloride SA (KLOR-CON) 20 MEQ tablet [id number] Yes Take 20 mEq by mouth. [provider] Taking Active   pregabalin (LYRICA) 100 MG capsule [id number] Yes TAKE 1 CAPSULE(100 MG) BY MOUTH THREE TIMES DAILY [name], MD Taking Active   Semaglutide, 1 MG/DOSE, (OZEMPIC, 1 MG/DOSE,) 4 MG/3ML SOPN [id number] Yes Inject 0.75 mLs (1 mg total) into the skin once a week. [name], MD Taking Active   Syringe/Needle, Disp, (SYRINGE 3CC/25GX1") 25G X 1" 3 ML MISC [id number] Yes Use as directed to administer b12 injections. [name],  MD Taking Active   zinc sulfate 220 (50 Zn) MG capsule [id number] Yes Take 1 capsule (220 mg total) by mouth daily. [name], MD Taking Active           Patient Active Problem List   Diagnosis Date Noted  . Cryptogenic stroke ([place]) 07/13/2020  . History of loop recorder 07/13/2020  . Weakness 06/18/2020  . Acute on chronic respiratory failure ([place]) 05/20/2020  . History of 2019 novel coronavirus disease (COVID-19) 05/20/2020  . Obesity, Class III, BMI 40-49.9 (morbid obesity) ([place]) 04/04/2020  . CKD (chronic kidney disease) stage 4, GFR 15-29 ml/min (HCC) 04/04/2020  . Pneumonia due to COVID-19 virus 04/03/2020  . AKI (acute kidney injury) ([place]) 04/03/2020  . Elevated troponin 04/03/2020  . Acquired trigger finger 06/08/2019  . Lymphedema 06/08/2019  . Anemia 04/17/2019  . Swelling of left lower extremity 01/10/2019  . Facial droop 04/09/2018  . Daytime somnolence 03/30/2018  . Carotid stenosis 02/03/2018  . Intracranial vascular stenosis 09/12/2017  .  Cough 01/20/2017  . Bell's palsy 11/10/2016  . History of CVA (cerebrovascular accident) 11/10/2016  . 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 History of nephrolithiasis 10/27/2016  . TIA (transient ischemic attack) 10/20/2016  . Near syncope 06/23/2016  . 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 Neuropathy 08/06/2015  . Health care maintenance 08/06/2015  . Essential hypertension 08/06/2015  . Heme positive stool 10/10/2013  . Hypothyroidism 10/10/2013  . Microalbuminuria 10/10/2013  . Hyperlipidemia 10/10/2013  . B12 deficiency 10/10/2013  . Diabetes ([place]) 07/04/2013  . Environmental allergies 07/04/2013    Conditions to be addressed/monitored: CAD, HTN, HLD, DMII and Depression  Care Plan : Medication Management  Updates made by De Hollingshead, RPH-CPP since 10/06/2020 12:00 AM    Problem: Diabetes, CKD, chronic pain, HTN     Long-Range Goal: Disease Progression Prevention    Start Date: 10/06/2020  This Visit's Progress: On track  Priority: High  Note:   Current Barriers:  . Unable to independently monitor therapeutic efficacy . Suboptimal therapeutic regimen for diabetes, HTN  Pharmacist Clinical Goal(s):  Marland Kitchen Over the next 90 days, patient will achieve adherence to monitoring guidelines and medication adherence to achieve therapeutic efficacy through collaboration with PharmD and provider.   Interventions: . 1:1 collaboration with [name], MD regarding development and update of comprehensive plan of care as evidenced by provider attestation and co-signature . Inter-disciplinary care team collaboration (see longitudinal plan of care) . Comprehensive medication review performed; medication list updated in electronic medical record  Health Maintenance: . Recent hospitalization. S/p cholecystectomy   Diabetes: . Controlled; current treatment: Ozempic 1 mg weekly, Lantus 10 units QPM, Humalog 4-6 units with meals o Renal fx limits use of metformin, SGLT2 . Current glucose readings: at work so unable to review sugar logs, but reports most readings <200 with occasional post prandial elevations . Reports occasional hypoglycemia . Patient reports that he has a goal of getting off of insulin therapy. Reviewed that renal function limits many options.  . Discussed use of CGM given insulin burden, high frequency of glucose checks, and ability to enhance safety. Patient interested. Script sent to the pharmacy. Patient will call if PA needs to be completed. Discussed that we can schedule a nurse visit for demonstration. Reviewed interface with office portal via [place], he is hesitant about that aspect of therapy. Educated that we do not have to connect to [facility] if he does not want.  . Recommend to continue current regimen at this time  Hypertension complicated by CKD: . Uncontrolled/controlled; current treatment: amlodipine 10 mg QAM, losartan 100 mg QAM,  hydralazine 50 mg TID, furosemide 60 mg QAM, metolazone 2.5 mg two days weekly . Reviewed need to take metolazone ~ 30 minutes before loop diuretic to enhance efficacy. Patient will start doing this . Reviewed benefit of home BP monitoring. Recommend to continue current regimen along with nephrology and cardiology collaboration  Hyperlipidemia and ASCVD risk reduction: . Controlled; current treatment: atorvastatin 80 mg daily . Antiplatelet regimen: aspirin 81 mg daily, clopidogrel 75 mg daily  . Continue current regimen at this time, along with collaboration with cardiology.   Depression/Anxiety with neuropathy . Controlled; current treatment: duloxetine 20 mg daily, pregabalin 100 mg TID  . Complicated by wife's recent passing. Reports improvement in symptoms, good support.  . Recommend to continue current regimen at this time  Hypothyroidism: . Controlled per last lab work; current regimen: levothyroxine 150 mcg daily . Confirmed appropriate administration . Continue current regimen  at this time  Supplements: Marland Kitchen Vitamin D, Vitamin C, zinc, vitamin B12 injections, ferrous sulfate 325 mg dialy . Requests refill of Vitamin B12 injections. Will collaborate w PCP on this.   Patient Goals/Self-Care Activities . Over the next 90 days, patient will:  - take medications as prescribed check glucose at least 3 times daily using CGM, document, and provide at future appointments check blood pressure daily, document, and provide at future appointments  Follow Up Plan: Telephone follow up appointment with care management team member scheduled for: ~ 10 weeks      Medication Assistance:  None required.  Patient affirms current coverage meets needs.  Follow Up:  Patient agrees to Care Plan and Follow-up.  Plan: Telephone follow up appointment with care management team member scheduled for:  ~ 10 weeks  Catie [name], PharmD, [place], [place] Clinical Pharmacist [employer] [facility] [employer] [phone]

## 2020-10-06 NOTE — Patient Instructions (Signed)
Visit Information  Goals Addressed              This Visit's Progress     Patient Stated   .  Medication Monitoring (pt-stated)        Patient Goals/Self-Care Activities . Over the next 90 days, patient will:  - take medications as prescribed check glucose at least 3 times daily using CGM, document, and provide at future appointments check blood pressure daily, document, and provide at future appointments       Patient verbalizes understanding of instructions provided today and agrees to view in Welcome.    Plan: Telephone follow up appointment with care management team member scheduled for:  ~ 10 weeks  Catie [name], PharmD, [place], [place] Clinical Pharmacist [employer] at [employer] [phone]

## 2020-10-10 ENCOUNTER — Ambulatory Visit: Payer: Managed Care, Other (non HMO) | Attending: Internal Medicine

## 2020-10-10 ENCOUNTER — Other Ambulatory Visit: Payer: Self-pay

## 2020-10-10 DIAGNOSIS — R2681 Unsteadiness on feet: Secondary | ICD-10-CM | POA: Diagnosis not present

## 2020-10-10 NOTE — Therapy (Signed)
[place] MAIN [facility] SERVICES [address] [place], [place], [address] Phone: [phone]   Fax:  [phone]  Physical Therapy Evaluation  Patient Details  Name: Gregory Crane MRN: [id number] Date of Birth: 1971-03-17 Referring Provider (PT): [name], MD   Encounter Date: 10/10/2020   PT End of Session - 10/10/20 1011    Visit Number 1    Number of Visits 17    Date for PT Re-Evaluation 12/05/20    Authorization Type [employer] managed    Authorization Time Period Cert 5/46/50-3/54/65    PT Start Time 0922   Pt arrived late   PT Stop Time 1000    PT Time Calculation (min) 38 min    Activity Tolerance Patient tolerated treatment well;Patient limited by fatigue    Behavior During Therapy [facility] for tasks assessed/performed           Past Medical History:  Diagnosis Date  . Allergy   . Anemia   . Bell's palsy   . Diabetes mellitus without complication (HCC)    diet controlled  . Hypertension   . Hypothyroidism   . Kidney stones   . Pseudotumor cerebri   . Stroke [facility])     Past Surgical History:  Procedure Laterality Date  . COLONOSCOPY WITH PROPOFOL N/A 03/30/2020   Procedure: COLONOSCOPY WITH PROPOFOL;  Surgeon: Lesly Rubenstein, MD;  Location: [facility] ENDOSCOPY;  Service: Endoscopy;  Laterality: N/A;  . LOOP RECORDER INSERTION N/A 01/27/2018   Procedure: LOOP RECORDER INSERTION;  Surgeon: [name], MD;  Location: [place] CV LAB;  Service: Cardiovascular;  Laterality: N/A;  . LUMBAR PUNCTURE     as child  . NO PAST SURGERIES    . TEE WITHOUT CARDIOVERSION N/A 01/07/2018   Procedure: TRANSESOPHAGEAL ECHOCARDIOGRAM (TEE);  Surgeon: Minna Merritts, MD;  Location: [facility] ORS;  Service: Cardiovascular;  Laterality: N/A;    There were no vitals filed for this visit.    Subjective Assessment - 10/10/20 0925    Subjective Pt reports having idiopathic unsteadiness of his gait for >6 months. He does have a history of  CVA and PND c partial pedal paresthesias. Pt also reports weakness of legs with difficulty in climbing stairs.    Pertinent History PMH: anemia, CKD4, IDDM, HTN on amlodipine, hydralazin, losartan, lasix; HLD< hypoTSH, neuropathy with gait disturbance on Lyrica; COVID PNA hospitalization.    How long can you sit comfortably? Not    How long can you stand comfortably? ~10 minutes    How long can you walk comfortably? ~20 minutes    Currently in Pain? No/denies              [facility] PT Assessment - 10/10/20 0001      Assessment   Medical Diagnosis Unsteadiness of gait    Referring Provider (PT) [name], MD    Onset Date/Surgical Date --   Since before August 2021   Prior Therapy None      Precautions   Precautions None      Balance Screen   Has the patient fallen in the past 6 months Yes    How many times? 1    Has the patient had a decrease in activity level because of a fear of falling?  Yes    Is the patient reluctant to leave their home because of a fear of falling?  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      Observation/Other Assessments   Focus on Therapeutic Outcomes (FOTO)  59/100      Functional Tests   Functional tests Sit to Stand          Functional Tests: 5xSTS: 20.15sec, 3 falls back into chair, hands free   Balance Screening:  Normal stance x30sec: WNL Narrow stance x30sec: mild increased sway, mild perceived difficulty Narrow stance eyes closed x30sec: moderate to heavy sway, moderate perceived difficulty, no LOB   SLS: ~2-3sec prior to LOB bilat, modI LOB recovery using hand on wall   The Manual Testing of Muscles:  Hip flexion 4/5 Rt; 4+/5 Lt  Seated horizontal hip ABDCT: 5/5 bilat Seated horizontal hip adduction: 5/5 bilat Seated knee extension: 5/5 Seated knee flexion: 5/5  Ankle DF: 4/5 bilat  Ankle PF: 5/5 seated, needs to be testing in standing *Able to SLR but appears labored; able to bridge but looks weak  Supine Hip ABDCT: 4+/5 bilat  Prone knee leg raise hip extension: 4+/5 (Range limited) Prone knee flexion: 4+/5  Bed mobility: -moderate difficulty with rolling supine to /from prone, obvious weakness of core contributing  Visual inspection:  Pedal and ankle pitting edema visible at 5" proximal the talar dome bilat, pallor, flaky dry scales in places.   Gait assessment:  10MWT: 11.08sec, 0.2m/s  Abducted LLE, with abrupt passive collapse into knee varus end-range, supination of the midfoot/forefoot, and resultant Trendelenburg or Rt hip. Significant Left toe-out (>30 degrees) of unknown origin  Education regarding need to improve ankel strength to maximize potential for gross motor control, likely incorporation of AFO on Left.      Objective measurements completed on examination: See above findings.      PT Education - 10/10/20 1010    Education Details Pt has beginnings of bilat foot drop, contributing to sagital plane unsteadiness particularly with STS transfers, and gait deviation impacting knee and hip.    Person(s) Educated Patient    Methods Explanation;Demonstration    Comprehension Verbalized understanding            PT Short Term Goals - 10/10/20 1018      PT SHORT TERM GOAL #1   Title Pt to demonstrate improved 5xSTS in <15sec without LOB    Baseline 20sec with 3 LOB at eval    Time 4    Period Weeks    Status New    Target Date 11/07/20             PT Long Term Goals - 10/10/20 1019      PT LONG TERM GOAL #1   Title Pt to show improved FOTO  score to >70.    Baseline FOTO: 59/100 at evaluation    Time 8    Period Weeks    Status New    Target Date 12/05/20      PT LONG TERM GOAL #2   Title Pt to demonstrate 5/5 ankle dorsiflexion bilat.    Baseline 4/5 bilat at  eval    Time 8    Period Weeks    Status New    Target Date 12/05/20      PT LONG TERM GOAL #3   Title Pt to demnonstrate SLS bilat >30sec, shod, AFO as needed    Baseline <3sec at eval    Time 8    Period Weeks    Status New    Target Date 12/05/20                  Plan - 10/10/20 1012    Clinical Impression Statement Pt presenting to OPPT for evaluation of unsteadines of gait. Examination reveals significant loss of gross motor control of the ankles in stance, gait, and during transfers. Pt also with generalized weakness of core/hips and general deconditioning. Pt will benefit from skilled PT intervention to address deficits and impairment outlined in this evlauation, to improve stability of gait, decrease risk of ralls, and improve tolerance to activity required to perform ADL/IADL.    Personal Factors and Comorbidities Age;Comorbidity 1;Fitness    Examination-Activity Limitations Locomotion Level;Transfers;Carry;Toileting;Stand;Sit    Examination-Participation Restrictions Cleaning;Meal Prep;Occupation    Stability/Clinical Decision Making Evolving/Moderate complexity    Clinical Decision Making Low    Rehab Potential Good    PT Frequency 2x / week    PT Duration 12 weeks    PT Treatment/Interventions Cryotherapy;Electrical Stimulation;Moist Heat;DME Instruction;Neuromuscular re-education;Therapeutic activities;Therapeutic exercise;Stair training;Gait training;Functional mobility training;Patient/family education;Orthotic Fit/Training    PT Next Visit Plan BBT; establish HEP for ankles and balance    PT Home Exercise Plan None at eval    Consulted and Agree with Plan of Care Patient           Patient will benefit from skilled therapeutic  intervention in order to improve the following deficits and impairments:  Abnormal gait,Decreased coordination,Decreased range of motion,Difficulty walking,Cardiopulmonary status limiting activity,Decreased activity tolerance,Decreased balance,Decreased knowledge of precautions,Decreased knowledge of use of DME,Decreased mobility,Decreased strength,Impaired sensation,Postural dysfunction  Visit Diagnosis: Unsteadiness on feet     Problem List Patient Active Problem List   Diagnosis Date Noted  . Cryptogenic stroke ([place]) 07/13/2020  . History of loop recorder 07/13/2020  . Weakness 06/18/2020  . Acute on chronic respiratory failure ([place]) 05/20/2020  . History of 2019 novel coronavirus disease (COVID-19) 05/20/2020  . Obesity, Class III, BMI 40-49.9 (morbid obesity) ([place]) 04/04/2020  . CKD (chronic kidney disease) stage 4, GFR 15-29 ml/min (HCC) 04/04/2020  . Pneumonia due to COVID-19 virus 04/03/2020  . AKI (acute kidney injury) ([place]) 04/03/2020  . Elevated troponin 04/03/2020  . Acquired trigger finger 06/08/2019  . Lymphedema 06/08/2019  . Anemia 04/17/2019  . Swelling of left lower extremity 01/10/2019  . Facial droop 04/09/2018  . Daytime somnolence 03/30/2018  . Carotid stenosis 02/03/2018  . Intracranial vascular stenosis 09/12/2017  . Cough 01/20/2017  . Bell's palsy 11/10/2016  . History of CVA (cerebrovascular accident) 11/10/2016  . 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 History of nephrolithiasis 10/27/2016  . TIA (transient ischemic attack) 10/20/2016  . Near syncope 06/23/2016  . 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 Neuropathy 08/06/2015  . Health care maintenance 08/06/2015  . Essential hypertension 08/06/2015  . Heme positive stool 10/10/2013  . Hypothyroidism 10/10/2013  . Microalbuminuria 10/10/2013  . Hyperlipidemia 10/10/2013  . B12 deficiency 10/10/2013  . Diabetes ([place]) 07/04/2013  . Environmental allergies  07/04/2013   10:29 AM, 10/10/20 Etta Grandchild, PT, DPT Physical Therapist - [facility]  Outpatient Physical Therapy- [place] [phone]     Etta Grandchild 10/10/2020, 10:22 AM  [place] MAIN [facility] SERVICES [address] [place], [place], [address] Phone: [phone]   Fax:  [phone]  Name: Gregory Crane MRN: [id number] Date of Birth: 1971/02/12

## 2020-10-12 ENCOUNTER — Other Ambulatory Visit: Payer: Self-pay

## 2020-10-12 ENCOUNTER — Ambulatory Visit: Payer: Managed Care, Other (non HMO) | Admitting: Internal Medicine

## 2020-10-12 ENCOUNTER — Encounter: Payer: Self-pay | Admitting: Internal Medicine

## 2020-10-12 ENCOUNTER — Ambulatory Visit: Payer: Managed Care, Other (non HMO)

## 2020-10-12 VITALS — BP 122/78 | HR 76 | Temp 97.4°F | Resp 16 | Ht 72.0 in | Wt 277.0 lb

## 2020-10-12 DIAGNOSIS — E785 Hyperlipidemia, unspecified: Secondary | ICD-10-CM

## 2020-10-12 DIAGNOSIS — J1282 Pneumonia due to coronavirus disease 2019: Secondary | ICD-10-CM

## 2020-10-12 DIAGNOSIS — N184 Chronic kidney disease, stage 4 (severe): Secondary | ICD-10-CM

## 2020-10-12 DIAGNOSIS — Z8673 Personal history of transient ischemic attack (TIA), and cerebral infarction without residual deficits: Secondary | ICD-10-CM

## 2020-10-12 DIAGNOSIS — I10 Essential (primary) hypertension: Secondary | ICD-10-CM

## 2020-10-12 DIAGNOSIS — R2681 Unsteadiness on feet: Secondary | ICD-10-CM | POA: Diagnosis not present

## 2020-10-12 DIAGNOSIS — E039 Hypothyroidism, unspecified: Secondary | ICD-10-CM

## 2020-10-12 DIAGNOSIS — E11319 Type 2 diabetes mellitus with unspecified diabetic retinopathy without macular edema: Secondary | ICD-10-CM

## 2020-10-12 DIAGNOSIS — U071 COVID-19: Secondary | ICD-10-CM | POA: Diagnosis not present

## 2020-10-12 DIAGNOSIS — K635 Polyp of colon: Secondary | ICD-10-CM

## 2020-10-12 DIAGNOSIS — M7989 Other specified soft tissue disorders: Secondary | ICD-10-CM

## 2020-10-12 DIAGNOSIS — R531 Weakness: Secondary | ICD-10-CM | POA: Diagnosis not present

## 2020-10-12 DIAGNOSIS — D649 Anemia, unspecified: Secondary | ICD-10-CM

## 2020-10-12 DIAGNOSIS — G629 Polyneuropathy, unspecified: Secondary | ICD-10-CM

## 2020-10-12 DIAGNOSIS — Z86010 Personal history of colonic polyps: Secondary | ICD-10-CM

## 2020-10-12 DIAGNOSIS — Z794 Long term (current) use of insulin: Secondary | ICD-10-CM

## 2020-10-12 LAB — HM DIABETES FOOT EXAM

## 2020-10-12 NOTE — Progress Notes (Signed)
Patient ID: Gregory Crane, male   DOB: 1971/05/10, 50 y.o.   MRN: [id number]   Subjective:    Patient ID: Gregory Crane, male    DOB: 08/07/1971, 50 y.o.   MRN: [id number]  HPI This visit occurred during the SARS-CoV-2 public health emergency.  Safety protocols were in place, including screening questions prior to the visit, additional usage of staff PPE, and extensive cleaning of exam room while observing appropriate contact time as indicated for disinfecting solutions.  Patient here for a scheduled follow up.  Here to follow up regarding his diabetes, hypertension and cholesterol.  Was referred to PT to help with gait, weakness and balance.  Has been evaluated and currently going to PT.  [place] a few days ago.  Fell on left side - left shoulder.  (left ankle, left knee and left hip - issues - working with PT).  Some discomfort with left shoulder.  Able to lift and rotate arm.  Desires no further intervention at this time.  No head injury.  No chest pain reported.  Breathing stable.  Still with lower extremity swelling.  Overall improved from previous. No nausea or vomiting.  Bowels leveled out.  States AM sugar 150-160.     Past Medical History:  Diagnosis Date  . Allergy   . Anemia   . Bell's palsy   . Diabetes mellitus without complication (HCC)    diet controlled  . Hypertension   . Hypothyroidism   . Kidney stones   . Pseudotumor cerebri   . Stroke [facility])    Past Surgical History:  Procedure Laterality Date  . COLONOSCOPY WITH PROPOFOL N/A 03/30/2020   Procedure: COLONOSCOPY WITH PROPOFOL;  Surgeon: Lesly Rubenstein, MD;  Location: [facility] ENDOSCOPY;  Service: Endoscopy;  Laterality: N/A;  . LOOP RECORDER INSERTION N/A 01/27/2018   Procedure: LOOP RECORDER INSERTION;  Surgeon: [name], MD;  Location: [place] CV LAB;  Service: Cardiovascular;  Laterality: N/A;  . LUMBAR PUNCTURE     as child  . NO PAST SURGERIES    . TEE WITHOUT CARDIOVERSION N/A 01/07/2018   Procedure:  TRANSESOPHAGEAL ECHOCARDIOGRAM (TEE);  Surgeon: Minna Merritts, MD;  Location: [facility] ORS;  Service: Cardiovascular;  Laterality: N/A;   Family History  Problem Relation Age of Onset  . Breast cancer Mother   . Diabetes Father   . Diabetes Sister   . Arthritis Maternal Grandmother   . Diabetes Maternal Grandmother    Social History   Socioeconomic History  . Marital status: Married    Spouse name: [name]  . Number of children: 2  . Years of education: Not on file  . Highest education level: Not on file  Occupational History  . Not on file  Tobacco Use  . Smoking status: Never Smoker  . Smokeless tobacco: Never Used  Vaping Use  . Vaping Use: Never used  Substance and Sexual Activity  . Alcohol use: No    Alcohol/week: 0.0 standard drinks    Comment: very rarely (1-2/year)  . Drug use: No  . Sexual activity: Yes  Other Topics Concern  . Not on file  Social History Narrative   Lives at home with wife, independent at baseline.   Social Determinants of Health   Financial Resource Strain: Low Risk   . Difficulty of Paying Living Expenses: Not hard at all  Food Insecurity: Not on file  Transportation Needs: Not on file  Physical Activity: Not on file  Stress: Not on file  Social Connections: Not on file    Outpatient Encounter Medications as of 10/12/2020  Medication Sig  . Accu-Chek FastClix Lancets MISC USE TO CHECK BLOOD SUGAR THREE TIMES DAILY AS DIRECTED  . amLODipine (NORVASC) 10 MG tablet Take 1 tablet (10 mg total) by mouth daily.  Marland Kitchen ascorbic acid (VITAMIN C) 500 MG tablet Take 1 tablet (500 mg total) by mouth daily.  Marland Kitchen aspirin (ASPIRIN LOW DOSE) 81 MG EC tablet TAKE 1 TABLET(81 MG) BY MOUTH DAILY  . atorvastatin (LIPITOR) 80 MG tablet TAKE 1 TABLET(80 MG) BY MOUTH DAILY  . cholecalciferol (VITAMIN D3) 25 MCG (1000 UNIT) tablet Take 1,000 Units by mouth daily.  . clopidogrel (PLAVIX) 75 MG tablet TAKE 1 TABLET(75 MG) BY MOUTH DAILY  . Continuous Blood Gluc  Sensor (FREESTYLE LIBRE 2 SENSOR) MISC Use to check glucose at least TID  . cyanocobalamin (,VITAMIN B-12,) 1000 MCG/ML injection Inject 1 mL into the skin once a week for 3 weeks then once every 30 days. (Patient not taking: Reported on 10/06/2020)  . DULoxetine (CYMBALTA) 20 MG capsule TAKE 1 CAPSULE(20 MG) BY MOUTH DAILY  . furosemide (LASIX) 20 MG tablet TAKE 3 TABLETS(60 MG) BY MOUTH TWICE DAILY  . glucose blood (FREESTYLE LITE) test strip Check blood sugars twice a day (Dx. 250.02)  . HUMALOG 100 UNIT/ML injection INJECT 4 TO 6 UNITS UNDER THE SKIN THREE TIMES DAILY  . hydrALAZINE (APRESOLINE) 50 MG tablet TAKE 1 TABLET(50 MG) BY MOUTH THREE TIMES DAILY  . insulin glargine (LANTUS) 100 UNIT/ML injection ADMINISTER 15 UNITS UNDER THE SKIN EVERY NIGHT AT BEDTIME  . Insulin Syringe-Needle U-100 (INSULIN SYRINGE .5CC/30GX5/16") 30G X 5/16" 0.5 ML MISC USE AS DIRECTED WITH INSULIN  . levothyroxine (SYNTHROID) 150 MCG tablet Take 1 tablet (150 mcg total) by mouth daily before breakfast.  . losartan (COZAAR) 100 MG tablet TAKE 1 TABLET BY MOUTH DAILY  . metolazone (ZAROXOLYN) 2.5 MG tablet Take 2.5 mg by mouth. Twice weekly  . potassium chloride SA (KLOR-CON) 20 MEQ tablet Take 20 mEq by mouth.  . pregabalin (LYRICA) 100 MG capsule TAKE 1 CAPSULE(100 MG) BY MOUTH THREE TIMES DAILY  . Semaglutide, 1 MG/DOSE, (OZEMPIC, 1 MG/DOSE,) 4 MG/3ML SOPN Inject 0.75 mLs (1 mg total) into the skin once a week.  . Syringe/Needle, Disp, (SYRINGE 3CC/25GX1") 25G X 1" 3 ML MISC Use as directed to administer b12 injections.  Marland Kitchen zinc sulfate 220 (50 Zn) MG capsule Take 1 capsule (220 mg total) by mouth daily.   No facility-administered encounter medications on file as of 10/12/2020.    Review of Systems  Constitutional: Negative for appetite change.       Weight up some from previous visit.   HENT: Negative for congestion and sinus pressure.   Respiratory: Negative for cough and chest tightness.        Breathing  overall stable.   Cardiovascular: Positive for leg swelling. Negative for chest pain and palpitations.  Gastrointestinal: Negative for abdominal pain, diarrhea, nausea and vomiting.  Genitourinary: Negative for difficulty urinating and dysuria.  Musculoskeletal: Positive for gait problem. Negative for myalgias.  Neurological: Negative for dizziness and headaches.       Is unsteady - walking.   Psychiatric/Behavioral: Negative for agitation and dysphoric mood.       Objective:    Physical Exam Vitals reviewed.  Constitutional:      General: He is not in acute distress.    Appearance: Normal appearance. He is well-developed and well-nourished.  HENT:  Head: Normocephalic and atraumatic.     Right Ear: External ear normal.     Left Ear: External ear normal.  Eyes:     General: No scleral icterus.       Right eye: No discharge.        Left eye: No discharge.     Conjunctiva/sclera: Conjunctivae normal.  Cardiovascular:     Rate and Rhythm: Normal rate and regular rhythm.  Pulmonary:     Effort: Pulmonary effort is normal. No respiratory distress.     Breath sounds: Normal breath sounds.  Abdominal:     General: Bowel sounds are normal.     Palpations: Abdomen is soft.     Tenderness: There is no abdominal tenderness.  Musculoskeletal:        General: No tenderness or edema.     Cervical back: Neck supple. No tenderness.     Comments: Pedal and lower extremity swelling. No increased erythema.  Overall improved from previous.   Skin:    Findings: No erythema or rash.     Comments: Scab (healing) - tip of toe.    Neurological:     Mental Status: He is alert.  Psychiatric:        Mood and Affect: Mood and affect and mood normal.        Behavior: Behavior normal.     BP 122/78   Pulse 76   Temp (!) 97.4 F (36.3 C) (Oral)   Resp 16   Ht 6' (1.829 m)   Wt 277 lb (125.6 kg)   SpO2 99%   BMI 37.57 kg/m  Wt Readings from Last 3 Encounters:  10/12/20 277 lb (125.6  kg)  09/26/20 270 lb (122.5 kg)  08/15/20 270 lb 9.6 oz (122.7 kg)     Lab Results  Component Value Date   WBC 7.5 08/15/2020   HGB 11.6 (L) 08/15/2020   HCT 34.4 (L) 08/15/2020   PLT 218.0 08/15/2020   GLUCOSE 250 (H) 08/15/2020   CHOL 101 08/15/2020   TRIG 157.0 (H) 08/15/2020   HDL 25.10 (L) 08/15/2020   LDLCALC 45 08/15/2020   ALT 15 08/15/2020   AST 18 08/15/2020   NA 142 08/15/2020   K 5.0 08/15/2020   CL 108 08/15/2020   CREATININE 3.52 (H) 08/15/2020   BUN 66 (H) 08/15/2020   CO2 23 08/15/2020   TSH 11.90 (H) 08/15/2020   INR 0.85 04/09/2018   HGBA1C 6.7 (H) 08/15/2020       Assessment & Plan:   Problem List Items Addressed This Visit    Anemia    hgb remaining low.  Have referred to hematology. He request referral to GI.       Relevant Orders   Ambulatory referral to Gastroenterology   CKD (chronic kidney disease) stage 4, GFR 15-29 ml/min (HCC)    Followed by Dr Lanora Manis.  Avoid antiinflammatories.        Diabetes ([place])    Sugars as outlined.  Have been doing better.  Continues on ozempic.  Low carb diet and exercise as tolerated.  Follow met b and a1c.   Lab Results  Component Value Date   HGBA1C 6.7 (H) 08/15/2020        Relevant Orders   Ambulatory referral to Podiatry   Essential hypertension    Continue losartan, hydralazine and amlodipine.  Also on zaroxolyn and lasix.  Follow pressures.        Relevant Orders   Ambulatory referral to Cardiology  History of colon polyps    Colonoscopy 03/2020.  Multiple polyps.  Recommended f/u colonoscopy in 6 months.       Relevant Orders   Ambulatory referral to Gastroenterology   History of CVA (cerebrovascular accident)    Continue plavix, lipitor.        Relevant Orders   Ambulatory referral to Cardiology   Hyperlipidemia    Continue lipitor.  Low cholesterol diet and exercise.  Follow lipid panel and liver function tests.        Hypothyroidism    On thyroid replacement.  Follow tsh.        Neuropathy    Continues on lyrica.  Continue PT.       Pneumonia due to COVID-19 virus    Needs f/u cxr to confirm clear.       Swelling of left lower extremity    Has seen [facility].  Bilateral lower extremity edema. Overall improved from previous checks. Continue f/u with [facility]. Have discussed lymphedema pump.       Weakness    With neuropathy.  Weakness and unsteady gait. Seeing PT now.  Follow.         Other Visit Diagnoses    Polyp of colon, unspecified part of colon, unspecified type    -  Primary   Relevant Orders   Ambulatory referral to Gastroenterology       [name], MD

## 2020-10-12 NOTE — Therapy (Signed)
[place] MAIN [facility] SERVICES [address] [place], [place], [address] Phone: [phone]   Fax:  [phone]  Physical Therapy Treatment  Patient Details  Name: Gregory Crane MRN: [id number] Date of Birth: Jan 18, 1971 Referring Provider (PT): [name], MD   Encounter Date: 10/12/2020   PT End of Session - 10/12/20 1040    Visit Number 2    Number of Visits 17    Date for PT Re-Evaluation 12/05/20    Authorization Type [employer] managed    Authorization Time Period Cert 03/20/35-6/44/03    PT Start Time 0931    PT Stop Time 1015    PT Time Calculation (min) 44 min    Equipment Utilized During Treatment Gait belt   iPad; treadmill; yellow TB; blue TB; AFO   Activity Tolerance Patient tolerated treatment well;Patient limited by fatigue    Behavior During Therapy [facility] for tasks assessed/performed           Past Medical History:  Diagnosis Date  . Allergy   . Anemia   . Bell's palsy   . Diabetes mellitus without complication (HCC)    diet controlled  . Hypertension   . Hypothyroidism   . Kidney stones   . Pseudotumor cerebri   . Stroke [facility])     Past Surgical History:  Procedure Laterality Date  . COLONOSCOPY WITH PROPOFOL N/A 03/30/2020   Procedure: COLONOSCOPY WITH PROPOFOL;  Surgeon: Lesly Rubenstein, MD;  Location: [facility] ENDOSCOPY;  Service: Endoscopy;  Laterality: N/A;  . LOOP RECORDER INSERTION N/A 01/27/2018   Procedure: LOOP RECORDER INSERTION;  Surgeon: [name], MD;  Location: [place] CV LAB;  Service: Cardiovascular;  Laterality: N/A;  . LUMBAR PUNCTURE     as child  . NO PAST SURGERIES    . TEE WITHOUT CARDIOVERSION N/A 01/07/2018   Procedure: TRANSESOPHAGEAL ECHOCARDIOGRAM (TEE);  Surgeon: Minna Merritts, MD;  Location: [facility] ORS;  Service: Cardiovascular;  Laterality: N/A;    There were no vitals filed for this visit.   Subjective Assessment - 10/12/20 1038    Subjective Pt reports sutaining a  fall the night of last PT session 2 days prior, reports to have tripped on something, but no significant concerning injury sustained.    Pertinent History Referred by PCP for unsteadiness of gait. PMH: anemia, CKD4, IDDM, HTN on amlodipine, hydralazin, losartan, lasix; HLD< hypoTSH, neuropathy with gait disturbance on Lyrica; COVID PNA hospitalization.    Currently in Pain? No/denies          INTERVENTION THIS DATE: -treadmill gait for video analysis capture of ankles (2 minutes, 0.8MPH, BUE support) -overground gait x727ft for assessment of fitness and stability of ankle deviations  (appear fairly stable without progression of foot slap)  -seated yellow TB ankle eversion 2x15 bilat -seated yellow TB ankle dorsey flexion 2x15 bilat -seated LLE LAQ 2x10x3secH  -seated marching from elevated surface 2x10 bilat  -STS c BUE forward reach, 23" surface height 1x8, focus on anterio compartment postural control (noted left knee angular deviation that suggest chronic knee varus moment in midstance)  *education on gait video iPad *asked patient to bring lacing shoes and socks next visit for AFO trial.        PT Education - 10/12/20 1039    Education Details How ankle weakness if afecting knee and hip/back as well as increased risk of Left ankle inversion injury    Person(s) Educated Patient    Methods Explanation;Demonstration   video  Comprehension Verbalized understanding;Returned demonstration            PT Short Term Goals - 10/12/20 1044      PT SHORT TERM GOAL #1   Title Pt to demonstrate improved 5xSTS in <15sec without LOB    Baseline 20sec with 3 LOB at eval    Time 4    Period Weeks    Status On-going    Target Date 11/07/20             PT Long Term Goals - 10/12/20 1045      PT LONG TERM GOAL #1   Title Pt to show improved FOTO score to >70.    Baseline FOTO: 59/100 at evaluation    Time 8    Period Weeks    Status On-going    Target Date 12/05/20      PT  LONG TERM GOAL #2   Title Pt to demonstrate 5/5 ankle dorsiflexion bilat.    Baseline 4/5 bilat at eval    Time 8    Period Weeks    Status On-going    Target Date 12/05/20      PT LONG TERM GOAL #3   Title Pt to demnonstrate SLS bilat >30sec, shod, AFO as needed    Baseline <3sec at eval    Time 8    Period Weeks    Status On-going    Target Date 12/05/20                 Plan - 10/12/20 1041    Clinical Impression Statement Obtained video of ankles on treadmill gait for pt education. Overground gait performed with stable gait deviations, >655ft tolerated, but overall more limitations from low fitness levels. Pt contiues to have early foot slap on Right, and hard inversion supination on left increasing risk of inversion sprain injury. Trial of gait with theraband facilitated dorsiflexion and eversion. Unable to trial AFO as pt not wearing socks nor shoes that lace- will bring lacing shoes next session. Trial and issue of HEP with handout. Pt tolerates session well in general, mild SOB during sustained gait. Would like to see if AFO can decrease abrupt stance phase supination, and reduce knee varus in midstance.    Personal Factors and Comorbidities Age;Comorbidity 1;Fitness    Examination-Activity Limitations Locomotion Level;Transfers;Carry;Toileting;Stand;Sit    Examination-Participation Restrictions Cleaning;Meal Prep;Occupation    Stability/Clinical Decision Making Evolving/Moderate complexity    Clinical Decision Making Low    Rehab Potential Good    PT Frequency 2x / week    PT Duration 12 weeks    PT Treatment/Interventions Cryotherapy;Electrical Stimulation;Moist Heat;DME Instruction;Neuromuscular re-education;Therapeutic activities;Therapeutic exercise;Stair training;Gait training;Functional mobility training;Patient/family education;Orthotic Fit/Training    PT Next Visit Plan BBT; AFO trial Left (ipad video), FU on HEP     PT Home Exercise Plan Ankle EV c TB; ankle DF c  TB, STS from 23", marching seated, LLE LAQ   Consulted and Agree with Plan of Care Patient           Patient will benefit from skilled therapeutic intervention in order to improve the following deficits and impairments:  Abnormal gait,Decreased coordination,Decreased range of motion,Difficulty walking,Cardiopulmonary status limiting activity,Decreased activity tolerance,Decreased balance,Decreased knowledge of precautions,Decreased knowledge of use of DME,Decreased mobility,Decreased strength,Impaired sensation,Postural dysfunction  Visit Diagnosis: Unsteadiness on feet     Problem List Patient Active Problem List   Diagnosis Date Noted  . Cryptogenic stroke ([place]) 07/13/2020  . History of loop recorder 07/13/2020  . Weakness 06/18/2020  .  Acute on chronic respiratory failure ([place]) 05/20/2020  . History of 2019 novel coronavirus disease (COVID-19) 05/20/2020  . Obesity, Class III, BMI 40-49.9 (morbid obesity) ([place]) 04/04/2020  . CKD (chronic kidney disease) stage 4, GFR 15-29 ml/min (HCC) 04/04/2020  . Pneumonia due to COVID-19 virus 04/03/2020  . AKI (acute kidney injury) ([place]) 04/03/2020  . Elevated troponin 04/03/2020  . Acquired trigger finger 06/08/2019  . Lymphedema 06/08/2019  . Anemia 04/17/2019  . Swelling of left lower extremity 01/10/2019  . Facial droop 04/09/2018  . Daytime somnolence 03/30/2018  . Carotid stenosis 02/03/2018  . Intracranial vascular stenosis 09/12/2017  . Cough 01/20/2017  . Bell's palsy 11/10/2016  . History of CVA (cerebrovascular accident) 11/10/2016  . 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 History of nephrolithiasis 10/27/2016  . TIA (transient ischemic attack) 10/20/2016  . Near syncope 06/23/2016  . 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 Neuropathy 08/06/2015  . Health care maintenance 08/06/2015  . Essential hypertension 08/06/2015  . Heme positive stool 10/10/2013  . Hypothyroidism 10/10/2013  .  Microalbuminuria 10/10/2013  . Hyperlipidemia 10/10/2013  . B12 deficiency 10/10/2013  . Diabetes ([place]) 07/04/2013  . Environmental allergies 07/04/2013   10:51 AM, 10/12/20 Etta Grandchild, PT, DPT Physical Therapist - [facility]  Outpatient Physical [place] [phone]     Etta Grandchild 10/12/2020, 10:46 AM  [place] MAIN [facility] SERVICES [address] [place], [place], [address] Phone: [phone]   Fax:  [phone]  Name: Gregory Crane MRN: [id number] Date of Birth: [birth year]

## 2020-10-15 ENCOUNTER — Telehealth: Payer: Self-pay | Admitting: Internal Medicine

## 2020-10-15 ENCOUNTER — Encounter: Payer: Self-pay | Admitting: Internal Medicine

## 2020-10-15 DIAGNOSIS — R9389 Abnormal findings on diagnostic imaging of other specified body structures: Secondary | ICD-10-CM

## 2020-10-15 DIAGNOSIS — Z86010 Personal history of colonic polyps: Secondary | ICD-10-CM | POA: Insufficient documentation

## 2020-10-15 DIAGNOSIS — Z8616 Personal history of COVID-19: Secondary | ICD-10-CM

## 2020-10-15 NOTE — Assessment & Plan Note (Signed)
Continue plavix, lipitor.

## 2020-10-15 NOTE — Assessment & Plan Note (Signed)
With neuropathy.  Weakness and unsteady gait. Seeing PT now.  Follow.

## 2020-10-15 NOTE — Assessment & Plan Note (Signed)
On thyroid replacement.  Follow tsh.  

## 2020-10-15 NOTE — Assessment & Plan Note (Addendum)
hgb remaining low.  Have referred to hematology. He request referral to GI.

## 2020-10-15 NOTE — Telephone Encounter (Signed)
Please call and notify pt that he needs a f/u cxr to confirm everything clear.  (this is routine to confirm (once someone doing better) that cxr clears).  Order in.  Just needs to come in and get f/u cxr.

## 2020-10-15 NOTE — Assessment & Plan Note (Signed)
Colonoscopy 03/2020.  Multiple polyps.  Recommended f/u colonoscopy in 6 months.

## 2020-10-15 NOTE — Assessment & Plan Note (Signed)
Continue lipitor.  Low cholesterol diet and exercise.  Follow lipid panel and liver function tests.   

## 2020-10-15 NOTE — Assessment & Plan Note (Signed)
Sugars as outlined.  Have been doing better.  Continues on ozempic.  Low carb diet and exercise as tolerated.  Follow met b and a1c.   Lab Results  Component Value Date   HGBA1C 6.7 (H) 08/15/2020

## 2020-10-15 NOTE — Assessment & Plan Note (Signed)
Continues on lyrica.  Continue PT.

## 2020-10-15 NOTE — Assessment & Plan Note (Signed)
Has seen [facility].  Bilateral lower extremity edema. Overall improved from previous checks. Continue f/u with [facility]. Have discussed lymphedema pump.

## 2020-10-15 NOTE — Assessment & Plan Note (Signed)
Continue losartan, hydralazine and amlodipine.  Also on zaroxolyn and lasix.  Follow pressures.

## 2020-10-15 NOTE — Assessment & Plan Note (Signed)
Needs f/u cxr to confirm clear.

## 2020-10-15 NOTE — Assessment & Plan Note (Signed)
Followed by Dr Lanora Manis.  Avoid antiinflammatories.

## 2020-10-17 ENCOUNTER — Ambulatory Visit: Payer: Managed Care, Other (non HMO)

## 2020-10-17 NOTE — Telephone Encounter (Signed)
Cxr scheduled 

## 2020-10-18 ENCOUNTER — Other Ambulatory Visit: Payer: Self-pay | Admitting: Internal Medicine

## 2020-10-19 ENCOUNTER — Encounter: Payer: Self-pay | Admitting: Internal Medicine

## 2020-10-19 ENCOUNTER — Other Ambulatory Visit: Payer: Self-pay

## 2020-10-19 ENCOUNTER — Ambulatory Visit: Payer: Managed Care, Other (non HMO)

## 2020-10-19 ENCOUNTER — Other Ambulatory Visit: Payer: Managed Care, Other (non HMO)

## 2020-10-20 ENCOUNTER — Ambulatory Visit: Payer: Self-pay | Admitting: Podiatry

## 2020-10-24 ENCOUNTER — Ambulatory Visit: Payer: No Typology Code available for payment source | Attending: Internal Medicine

## 2020-10-24 ENCOUNTER — Ambulatory Visit (INDEPENDENT_AMBULATORY_CARE_PROVIDER_SITE_OTHER): Payer: No Typology Code available for payment source | Admitting: Podiatry

## 2020-10-24 ENCOUNTER — Other Ambulatory Visit: Payer: Self-pay

## 2020-10-24 VITALS — BP 166/66 | HR 71

## 2020-10-24 DIAGNOSIS — R2689 Other abnormalities of gait and mobility: Secondary | ICD-10-CM | POA: Insufficient documentation

## 2020-10-24 DIAGNOSIS — M79674 Pain in right toe(s): Secondary | ICD-10-CM

## 2020-10-24 DIAGNOSIS — M6281 Muscle weakness (generalized): Secondary | ICD-10-CM | POA: Insufficient documentation

## 2020-10-24 DIAGNOSIS — R2681 Unsteadiness on feet: Secondary | ICD-10-CM | POA: Diagnosis not present

## 2020-10-24 DIAGNOSIS — B351 Tinea unguium: Secondary | ICD-10-CM | POA: Diagnosis not present

## 2020-10-24 DIAGNOSIS — E0843 Diabetes mellitus due to underlying condition with diabetic autonomic (poly)neuropathy: Secondary | ICD-10-CM

## 2020-10-24 DIAGNOSIS — M79675 Pain in left toe(s): Secondary | ICD-10-CM | POA: Diagnosis not present

## 2020-10-24 NOTE — Therapy (Signed)
[place] MAIN [facility] SERVICES [address] [place], [place], [address] Phone: [phone]   Fax:  [phone]  Physical Therapy Treatment  Patient Details  Name: Gregory Crane MRN: [id number] Date of Birth: 1970-12-03 Referring Provider (PT): [name], MD   Encounter Date: 10/24/2020   PT End of Session - 10/24/20 1226    Visit Number 3    Number of Visits 17    Date for PT Re-Evaluation 12/05/20    Authorization Type [employer] managed    Authorization Time Period Cert 9/92/42-6/83/41    PT Start Time 0855    PT Stop Time 0929    PT Time Calculation (min) 34 min    Equipment Utilized During Treatment Gait belt   iPad; treadmill; yellow TB; blue TB; AFO   Activity Tolerance Patient tolerated treatment well;Patient limited by fatigue    Behavior During Therapy [facility] for tasks assessed/performed           Past Medical History:  Diagnosis Date  . Allergy   . Anemia   . Bell's palsy   . Diabetes mellitus without complication (HCC)    diet controlled  . Hypertension   . Hypothyroidism   . Kidney stones   . Pseudotumor cerebri   . Stroke [facility])     Past Surgical History:  Procedure Laterality Date  . COLONOSCOPY WITH PROPOFOL N/A 03/30/2020   Procedure: COLONOSCOPY WITH PROPOFOL;  Surgeon: Lesly Rubenstein, MD;  Location: [facility] ENDOSCOPY;  Service: Endoscopy;  Laterality: N/A;  . LOOP RECORDER INSERTION N/A 01/27/2018   Procedure: LOOP RECORDER INSERTION;  Surgeon: [name], MD;  Location: [place] CV LAB;  Service: Cardiovascular;  Laterality: N/A;  . LUMBAR PUNCTURE     as child  . NO PAST SURGERIES    . TEE WITHOUT CARDIOVERSION N/A 01/07/2018   Procedure: TRANSESOPHAGEAL ECHOCARDIOGRAM (TEE);  Surgeon: Minna Merritts, MD;  Location: [facility] ORS;  Service: Cardiovascular;  Laterality: N/A;    Vitals:   10/24/20 0907  BP: (!) 166/66  Pulse: 71  SpO2: 97%    Treatment:  Nustep level 3 x 2 minutes, level 1 x 1  minutes, level 0 x 1 minute;  SPM 70s; Pt reports quick onset of fatigue in LEs. Seated rest break  Seated YTB ankle eversion 2x15 bilat, VC for technique  Seated YTB inversion inversion 3x15 bilat, VC for technique  Standing gastroc stretch - 2x30 sec B LEs, VC and demonstration for technique   Standing dorsiflexion 2x20, TC/VC and demonstration, VC/TC and demonstration for technique  Seated rest break between sets  Standing heel raises 2x20 - VC and demonstration for technique Seated rest break  STS 10x, with BUE assist off chair     PT Short Term Goals - 10/12/20 1044      PT SHORT TERM GOAL #1   Title Pt to demonstrate improved 5xSTS in <15sec without LOB    Baseline 20sec with 3 LOB at eval    Time 4    Period Weeks    Status On-going    Target Date 11/07/20             PT Long Term Goals - 10/12/20 1045      PT LONG TERM GOAL #1   Title Pt to show improved FOTO score to >70.    Baseline FOTO: 59/100 at evaluation    Time 8    Period Weeks    Status On-going    Target Date  12/05/20      PT LONG TERM GOAL #2   Title Pt to demonstrate 5/5 ankle dorsiflexion bilat.    Baseline 4/5 bilat at eval    Time 8    Period Weeks    Status On-going    Target Date 12/05/20      PT LONG TERM GOAL #3   Title Pt to demnonstrate SLS bilat >30sec, shod, AFO as needed    Baseline <3sec at eval    Time 8    Period Weeks    Status On-going    Target Date 12/05/20                 Plan - 10/24/20 1230    Clinical Impression Statement PT session limited today due to pt late arrival. Pt with reports of quick onset of fatigue with all therex, indicating decreased LE endurance. PT discussed referral to pt's dr for L AFO and pt confirmed that he would like to move forward with referral. Pt wil benefit from further skilled PT to increase BLE strength, endurance, balance and functional capacity to improve ease with ADLs and QOL.    Personal Factors and Comorbidities  Age;Comorbidity 1;Fitness    Examination-Activity Limitations Locomotion Level;Transfers;Carry;Toileting;Stand;Sit    Examination-Participation Restrictions Cleaning;Meal Prep;Occupation    Stability/Clinical Decision Making Evolving/Moderate complexity    Rehab Potential Good    PT Frequency 2x / week    PT Duration 12 weeks    PT Treatment/Interventions Cryotherapy;Electrical Stimulation;Moist Heat;DME Instruction;Neuromuscular re-education;Therapeutic activities;Therapeutic exercise;Stair training;Gait training;Functional mobility training;Patient/family education;Orthotic Fit/Training    PT Next Visit Plan BBT; establish HEP for ankles and balance; initiate HEP, balance exercises    PT Home Exercise Plan None at eval    Consulted and Agree with Plan of Care Patient           Patient will benefit from skilled therapeutic intervention in order to improve the following deficits and impairments:  Abnormal gait,Decreased coordination,Decreased range of motion,Difficulty walking,Cardiopulmonary status limiting activity,Decreased activity tolerance,Decreased balance,Decreased knowledge of precautions,Decreased knowledge of use of DME,Decreased mobility,Decreased strength,Impaired sensation,Postural dysfunction  Visit Diagnosis: Unsteadiness on feet  Muscle weakness (generalized)  Other abnormalities of gait and mobility     Problem List Patient Active Problem List   Diagnosis Date Noted  . History of colon polyps 10/15/2020  . Postoperative hemorrhage involving digestive system following digestive system procedure 09/19/2020  . Acute renal failure superimposed on stage 4 chronic kidney disease ([place]) 09/17/2020  . Acute cholecystitis without calculus 09/09/2020  . Type 2 diabetes mellitus, with long-term current use of insulin ([place]) 09/09/2020  . Cryptogenic stroke ([place]) 07/13/2020  . History of loop recorder 07/13/2020  . Weakness 06/18/2020  . History of 2019 novel coronavirus  disease (COVID-19) 05/20/2020  . CKD (chronic kidney disease) stage 4, GFR 15-29 ml/min (HCC) 04/04/2020  . Pneumonia due to COVID-19 virus 04/03/2020  . AKI (acute kidney injury) ([place]) 04/03/2020  . Elevated troponin 04/03/2020  . Acquired trigger finger 06/08/2019  . Lymphedema 06/08/2019  . Anemia 04/17/2019  . Swelling of left lower extremity 01/10/2019  . Facial droop 04/09/2018  . Daytime somnolence 03/30/2018  . Carotid stenosis 02/03/2018  . Intracranial vascular stenosis 09/12/2017  . Cough 01/20/2017  . Bell's palsy 11/10/2016  . History of CVA (cerebrovascular accident) 11/10/2016  . 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 History of nephrolithiasis 10/27/2016  . TIA (transient ischemic attack) 10/20/2016  . Near syncope 06/23/2016  . 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  Neuropathy 08/06/2015  . Health care maintenance 08/06/2015  . Essential hypertension 08/06/2015  . Heme positive stool 10/10/2013  . Hypothyroidism 10/10/2013  . Microalbuminuria 10/10/2013  . Hyperlipidemia 10/10/2013  . B12 deficiency 10/10/2013  . Diabetes ([place]) 07/04/2013  . Environmental allergies 07/04/2013   Ricard Dillon PT, DPT 10/24/2020, 12:45 PM  [place] MAIN [facility] SERVICES [address] [place], [place], [address] Phone: [phone]   Fax:  [phone]  Name: Gregory Crane MRN: [id number] Date of Birth: 08/10/1971

## 2020-10-24 NOTE — Progress Notes (Signed)
   SUBJECTIVE Patient with a history of diabetes mellitus presents to office today complaining of elongated, thickened nails that cause pain while ambulating in shoes.  He is unable to trim his own nails. Patient is here for further evaluation and treatment.   Past Medical History:  Diagnosis Date  . Allergy   . Anemia   . Bell's palsy   . Diabetes mellitus without complication (HCC)    diet controlled  . Hypertension   . Hypothyroidism   . Kidney stones   . Pseudotumor cerebri   . Stroke [facility])     OBJECTIVE General Patient is awake, alert, and oriented x 3 and in no acute distress. Derm Skin is dry and supple bilateral. Negative open lesions or macerations. Remaining integument unremarkable. Nails are tender, long, thickened and dystrophic with subungual debris, consistent with onychomycosis, 1-5 bilateral. No signs of infection noted. Vasc  DP and PT pedal pulses palpable bilaterally. Temperature gradient within normal limits.  Neuro Epicritic and protective threshold sensation diminished bilaterally.  Musculoskeletal Exam No symptomatic pedal deformities noted bilateral. Muscular strength within normal limits.  ASSESSMENT 1. Diabetes Mellitus w/ peripheral neuropathy 2. Onychomycosis of nail due to dermatophyte bilateral 3. Pain in foot bilateral  PLAN OF CARE 1. Patient evaluated today. 2. Instructed to maintain good pedal hygiene and foot care. Stressed importance of controlling blood sugar.  3. Mechanical debridement of nails 1-5 bilaterally performed using a nail nipper. Filed with dremel without incident.  4. Return to clinic in 3 mos.     Edrick Kins, DPM [facility]  Dr. Edrick Kins, DPM    [address], [place] [address]                Office [phone]  Fax [phone]

## 2020-10-25 ENCOUNTER — Other Ambulatory Visit: Payer: Self-pay | Admitting: Internal Medicine

## 2020-10-26 ENCOUNTER — Ambulatory Visit: Payer: No Typology Code available for payment source

## 2020-10-31 ENCOUNTER — Other Ambulatory Visit: Payer: Self-pay

## 2020-10-31 ENCOUNTER — Ambulatory Visit: Payer: No Typology Code available for payment source

## 2020-10-31 ENCOUNTER — Encounter: Payer: Self-pay | Admitting: Emergency Medicine

## 2020-10-31 ENCOUNTER — Emergency Department
Admission: EM | Admit: 2020-10-31 | Discharge: 2020-10-31 | Disposition: A | Discharge status: Home / Self Care | Payer: No Typology Code available for payment source | Attending: Emergency Medicine | Admitting: Emergency Medicine

## 2020-10-31 DIAGNOSIS — I129 Hypertensive chronic kidney disease with stage 1 through stage 4 chronic kidney disease, or unspecified chronic kidney disease: Secondary | ICD-10-CM | POA: Diagnosis not present

## 2020-10-31 DIAGNOSIS — Z8673 Personal history of transient ischemic attack (TIA), and cerebral infarction without residual deficits: Secondary | ICD-10-CM | POA: Insufficient documentation

## 2020-10-31 DIAGNOSIS — E039 Hypothyroidism, unspecified: Secondary | ICD-10-CM | POA: Insufficient documentation

## 2020-10-31 DIAGNOSIS — Y9389 Activity, other specified: Secondary | ICD-10-CM | POA: Insufficient documentation

## 2020-10-31 DIAGNOSIS — E1122 Type 2 diabetes mellitus with diabetic chronic kidney disease: Secondary | ICD-10-CM | POA: Diagnosis not present

## 2020-10-31 DIAGNOSIS — S01511A Laceration without foreign body of lip, initial encounter: Secondary | ICD-10-CM | POA: Diagnosis present

## 2020-10-31 DIAGNOSIS — Y92481 Parking lot as the place of occurrence of the external cause: Secondary | ICD-10-CM | POA: Insufficient documentation

## 2020-10-31 DIAGNOSIS — Y999 Unspecified external cause status: Secondary | ICD-10-CM | POA: Diagnosis not present

## 2020-10-31 DIAGNOSIS — Z7901 Long term (current) use of anticoagulants: Secondary | ICD-10-CM | POA: Insufficient documentation

## 2020-10-31 DIAGNOSIS — Z794 Long term (current) use of insulin: Secondary | ICD-10-CM | POA: Insufficient documentation

## 2020-10-31 DIAGNOSIS — Z79899 Other long term (current) drug therapy: Secondary | ICD-10-CM | POA: Diagnosis not present

## 2020-10-31 DIAGNOSIS — W19XXXA Unspecified fall, initial encounter: Secondary | ICD-10-CM

## 2020-10-31 DIAGNOSIS — N184 Chronic kidney disease, stage 4 (severe): Secondary | ICD-10-CM | POA: Diagnosis not present

## 2020-10-31 DIAGNOSIS — W1839XA Other fall on same level, initial encounter: Secondary | ICD-10-CM | POA: Diagnosis not present

## 2020-10-31 MED ORDER — BACITRACIN-NEOMYCIN-POLYMYXIN 400-5-5000 EX OINT
TOPICAL_OINTMENT | Freq: Once | CUTANEOUS | Status: DC
  Filled 2020-10-31: qty 1

## 2020-10-31 NOTE — Discharge Instructions (Addendum)
Lip laceration did not require suturing.  Follow discharge care instructions.  Return to ED if condition worsens.  Advise over-the-counter Neosporin applied to the area twice a day.

## 2020-10-31 NOTE — ED Notes (Signed)
See triage note  Presents s/p trip and fall  States he hit his face in the parking lot  Small laceration noted to lip  No LOC and denies any other sx's

## 2020-10-31 NOTE — ED Triage Notes (Signed)
Patient presents to the ED after a fall in the parking lot on his way into physical therapy.  Patient appears to have a small laceration above his top lip.  Patient is in physical therapy to regain balance post stroke.  Patient states he did not lose consciousness, but tripped and had a mechanical fall.  Patient is alert and oriented x 4 at this time.

## 2020-10-31 NOTE — ED Provider Notes (Signed)
[facility] Emergency Department Provider Note   ____________________________________________   Event Date/Time   First MD Initiated Contact with Patient 10/31/20 1002     (approximate)  I have reviewed the triage vital signs and the nursing notes.   HISTORY  Chief Complaint Fall    HPI Gregory Crane is a 50 y.o. male patient presents with upper lip laceration secondary to mechanical fall.  Patient states he fell secondary to losing his balance in the parking 100th with physical therapy.  Patient sustained a laceration to the inner upper lip.  Denies LOC or head injury.  Patient is on blood thinners secondary to post CVA.  Patient resolved with physical therapy when incident occurred.  Bleeding is controlled direct pressure and ice pack.      Past Medical History:  Diagnosis Date  . Allergy   . Anemia   . Bell's palsy   . Diabetes mellitus without complication (HCC)    diet controlled  . Hypertension   . Hypothyroidism   . Kidney stones   . Pseudotumor cerebri   . Stroke [facility])     Patient Active Problem List   Diagnosis Date Noted  . History of colon polyps 10/15/2020  . Postoperative hemorrhage involving digestive system following digestive system procedure 09/19/2020  . Acute renal failure superimposed on stage 4 chronic kidney disease ([place]) 09/17/2020  . Acute cholecystitis without calculus 09/09/2020  . Type 2 diabetes mellitus, with long-term current use of insulin ([place]) 09/09/2020  . Cryptogenic stroke ([place]) 07/13/2020  . History of loop recorder 07/13/2020  . Weakness 06/18/2020  . History of 2019 novel coronavirus disease (COVID-19) 05/20/2020  . CKD (chronic kidney disease) stage 4, GFR 15-29 ml/min (HCC) 04/04/2020  . Pneumonia due to COVID-19 virus 04/03/2020  . AKI (acute kidney injury) ([place]) 04/03/2020  . Elevated troponin 04/03/2020  . Acquired trigger finger 06/08/2019  . Lymphedema 06/08/2019  . Anemia 04/17/2019  .  Swelling of left lower extremity 01/10/2019  . Facial droop 04/09/2018  . Daytime somnolence 03/30/2018  . Carotid stenosis 02/03/2018  . Intracranial vascular stenosis 09/12/2017  . Cough 01/20/2017  . Bell's palsy 11/10/2016  . History of CVA (cerebrovascular accident) 11/10/2016  . 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 History of nephrolithiasis 10/27/2016  . TIA (transient ischemic attack) 10/20/2016  . Near syncope 06/23/2016  . 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 Neuropathy 08/06/2015  . Health care maintenance 08/06/2015  . Essential hypertension 08/06/2015  . Heme positive stool 10/10/2013  . Hypothyroidism 10/10/2013  . Microalbuminuria 10/10/2013  . Hyperlipidemia 10/10/2013  . B12 deficiency 10/10/2013  . Diabetes ([place]) 07/04/2013  . Environmental allergies 07/04/2013    Past Surgical History:  Procedure Laterality Date  . COLONOSCOPY WITH PROPOFOL N/A 03/30/2020   Procedure: COLONOSCOPY WITH PROPOFOL;  Surgeon: Lesly Rubenstein, MD;  Location: [facility] ENDOSCOPY;  Service: Endoscopy;  Laterality: N/A;  . LOOP RECORDER INSERTION N/A 01/27/2018   Procedure: LOOP RECORDER INSERTION;  Surgeon: [name], MD;  Location: [place] CV LAB;  Service: Cardiovascular;  Laterality: N/A;  . LUMBAR PUNCTURE     as child  . NO PAST SURGERIES    . TEE WITHOUT CARDIOVERSION N/A 01/07/2018   Procedure: TRANSESOPHAGEAL ECHOCARDIOGRAM (TEE);  Surgeon: Minna Merritts, MD;  Location: [facility] ORS;  Service: Cardiovascular;  Laterality: N/A;    Prior to Admission medications   Medication Sig Start Date End Date Taking? Authorizing Provider  Accu-Chek FastClix Lancets  MISC USE TO CHECK BLOOD SUGAR THREE TIMES DAILY AS DIRECTED 05/31/20   Leone Haven, MD  amLODipine (NORVASC) 10 MG tablet Take 1 tablet (10 mg total) by mouth daily. 06/27/20   [name], MD  ascorbic acid (VITAMIN C) 500 MG tablet Take 1 tablet (500 mg total) by mouth  daily. 04/07/20   [name], MD  aspirin (ASPIRIN LOW DOSE) 81 MG EC tablet TAKE 1 TABLET(81 MG) BY MOUTH DAILY 07/22/20   [name], MD  atorvastatin (LIPITOR) 80 MG tablet TAKE 1 TABLET(80 MG) BY MOUTH DAILY 08/14/20   [name], MD  cholecalciferol (VITAMIN D3) 25 MCG (1000 UNIT) tablet Take 1,000 Units by mouth daily.    [provider]  clopidogrel (PLAVIX) 75 MG tablet TAKE 1 TABLET(75 MG) BY MOUTH DAILY 07/13/20   [name], MD  Continuous Blood Gluc Sensor (FREESTYLE LIBRE 2 SENSOR) MISC Use to check glucose at least TID 10/06/20   [name], MD  cyanocobalamin (,VITAMIN B-12,) 1000 MCG/ML injection Inject 1 mL into the skin once a week for 3 weeks then once every 30 days. Patient not taking: Reported on 10/06/2020 06/17/19   [name], MD  DULoxetine (CYMBALTA) 20 MG capsule TAKE 1 CAPSULE(20 MG) BY MOUTH DAILY 08/30/20   [name], MD  furosemide (LASIX) 20 MG tablet TAKE 3 TABLETS(60 MG) BY MOUTH TWICE DAILY 07/25/20   [name], MD  glucose blood (FREESTYLE LITE) test strip Check blood sugars twice a day (Dx. 250.02) 04/18/17   [name], MD  HUMALOG 100 UNIT/ML injection INJECT AS DIRECTED PER SLIDING SCALE 70-200: 0 UNITS, 201-250: 1 UNIT, 251-300: 2 UNITS, 301-350: 3 UNITS,>350: 4 UNITS 10/25/20   [name], MD  hydrALAZINE (APRESOLINE) 50 MG tablet TAKE 1 TABLET(50 MG) BY MOUTH THREE TIMES DAILY 05/03/20   [name], MD  insulin glargine (LANTUS) 100 UNIT/ML injection INJECT 5 UNITS UNDER THE SKIN NIGHTLY 10/18/20   [name], MD  Insulin Syringe-Needle U-100 (INSULIN SYRINGE .5CC/30GX5/16") 30G X 5/16" 0.5 ML MISC USE AS DIRECTED WITH INSULIN 09/14/19   [name], MD  levothyroxine (SYNTHROID) 150 MCG tablet Take 1 tablet (150 mcg total) by mouth daily before breakfast. 08/24/20   [name], MD  losartan (COZAAR) 100 MG tablet TAKE 1 TABLET BY MOUTH DAILY 07/13/20   [name], MD  metolazone  (ZAROXOLYN) 2.5 MG tablet Take 2.5 mg by mouth. Twice weekly 06/28/20 06/28/21  [provider]  potassium chloride SA (KLOR-CON) 20 MEQ tablet Take 20 mEq by mouth. 07/31/20   [provider]  pregabalin (LYRICA) 100 MG capsule TAKE 1 CAPSULE(100 MG) BY MOUTH THREE TIMES DAILY 07/15/20   [name], MD  Semaglutide, 1 MG/DOSE, (OZEMPIC, 1 MG/DOSE,) 4 MG/3ML SOPN Inject 0.75 mLs (1 mg total) into the skin once a week. 04/10/20   [name], MD  Syringe/Needle, Disp, (SYRINGE 3CC/25GX1") 25G X 1" 3 ML MISC Use as directed to administer b12 injections. 06/21/19   [name], MD  zinc sulfate 220 (50 Zn) MG capsule Take 1 capsule (220 mg total) by mouth daily. 04/07/20   [name], MD    Allergies Patient has no known allergies.  Family History  Problem Relation Age of Onset  . Breast cancer Mother   . Diabetes Father   . Diabetes Sister   . Arthritis Maternal Grandmother   . Diabetes Maternal Grandmother     Social History Social History   Tobacco Use  . Smoking status: Never Smoker  .  Smokeless tobacco: Never Used  Vaping Use  . Vaping Use: Never used  Substance Use Topics  . Alcohol use: No    Alcohol/week: 0.0 standard drinks    Comment: very rarely (1-2/year)  . Drug use: No    Review of Systems Constitutional: No fever/chills Eyes: No visual changes. ENT: No sore throat. Cardiovascular: Denies chest pain. Respiratory: Denies shortness of breath. Gastrointestinal: No abdominal pain.  No nausea, no vomiting.  No diarrhea.  No constipation. Genitourinary: Negative for dysuria. Musculoskeletal: Negative for back pain. Skin: Negative for rash.  Upper lip laceration. Neurological: Negative for headaches, focal weakness or numbness. Endocrine:  Diabetes, hyperlipidemia, hypertension, hypothyroidism.   ____________________________________________   PHYSICAL EXAM:  VITAL SIGNS: ED Triage Vitals  Enc Vitals Group     BP 10/31/20 0937  (!) 162/77     Pulse Rate 10/31/20 0937 65     Resp 10/31/20 0937 16     Temp 10/31/20 0937 98.6 F (37 C)     Temp Source 10/31/20 0937 Oral     SpO2 10/31/20 0937 100 %     Weight 10/31/20 0938 280 lb (127 kg)     Height 10/31/20 0938 6' (1.829 m)     Head Circumference --      Peak Flow --      Pain Score 10/31/20 0937 4     Pain Loc --      Pain Edu? --      Excl. in [place]? --     Constitutional: Alert and oriented. Well appearing and in no acute distress. Eyes: Conjunctivae are normal. PERRL. EOMI. Head: Atraumatic. Nose: No congestion/rhinnorhea. Mouth/Throat: Mucous membranes are moist.  Oropharynx non-erythematous. Neck: No stridor.  No cervical spine tenderness to palpation. Hematological/Lymphatic/Immunilogical: No cervical lymphadenopathy. Cardiovascular: Normal rate, regular rhythm. Grossly normal heart sounds.  Good peripheral circulation.  Elevated blood pressure Respiratory: Normal respiratory effort.  No retractions. Lungs CTAB. Gastrointestinal: Soft and nontender. No distention. No abdominal bruits. No CVA tenderness. Genitourinary: Deferred Musculoskeletal: No lower extremity tenderness nor edema.  No joint effusions. Neurologic:  Normal speech and language. No gross focal neurologic deficits are appreciated. No gait instability. Skin:  Skin is warm, dry and intact. No rash noted.  Small laceration to the upper outer and inner lip.   Psychiatric: Mood and affect are normal. Speech and behavior are normal.  ____________________________________________   LABS (all labs ordered are listed, but only abnormal results are displayed)  Labs Reviewed - No data to display ____________________________________________  EKG   ____________________________________________  RADIOLOGY I, Sable Feil, personally viewed and evaluated these images (plain radiographs) as part of my medical decision making, as well as reviewing the written report by the radiologist.  ED  MD interpretation:   Official radiology report(s): No results found.  ____________________________________________   PROCEDURES  Procedure(s) performed (including Critical Care):  Procedures   ____________________________________________   INITIAL IMPRESSION / ASSESSMENT AND PLAN / ED COURSE  As part of my medical decision making, I reviewed the following data within the [place]         Patient presents with small laceration upper and outer inner lip secondary to a mechanical fall.  No LOC.  Discussed with patient rationale for not suturing.  Patient given discharge care instruction advised follow-up PCP.  Return med ED if condition worsens.      ____________________________________________   FINAL CLINICAL IMPRESSION(S) / ED DIAGNOSES  Final diagnoses:  Lip laceration, initial encounter  Fall, initial  encounter     ED Discharge Orders    None      *Please note:  Keyston Ardolino was evaluated in Emergency Department on 10/31/2020 for the symptoms described in the history of present illness. He was evaluated in the context of the global COVID-19 pandemic, which necessitated consideration that the patient might be at risk for infection with the SARS-CoV-2 virus that causes COVID-19. Institutional protocols and algorithms that pertain to the evaluation of patients at risk for COVID-19 are in a state of rapid change based on information released by regulatory bodies including the CDC and federal and state organizations. These policies and algorithms were followed during the patient's care in the ED.  Some ED evaluations and interventions may be delayed as a result of limited staffing during and the pandemic.*   Note:  This document was prepared using Dragon voice recognition software and may include unintentional dictation errors.    Sable Feil, PA-C 10/31/20 1031    Delman Kitten, MD 10/31/20 1339

## 2020-11-01 NOTE — Telephone Encounter (Signed)
error 

## 2020-11-02 ENCOUNTER — Other Ambulatory Visit: Payer: Self-pay

## 2020-11-02 ENCOUNTER — Ambulatory Visit: Payer: No Typology Code available for payment source

## 2020-11-02 DIAGNOSIS — R2681 Unsteadiness on feet: Secondary | ICD-10-CM

## 2020-11-02 DIAGNOSIS — R2689 Other abnormalities of gait and mobility: Secondary | ICD-10-CM

## 2020-11-02 DIAGNOSIS — M6281 Muscle weakness (generalized): Secondary | ICD-10-CM

## 2020-11-02 NOTE — Therapy (Signed)
[place] [facility] SERVICES [address] [place], [place], [address] Phone: [phone]   Fax:  [phone]  Physical Therapy Treatment  Patient Details  Name: Gregory Crane MRN: [id number] Date of Birth: July 04, 1971 Referring Provider (PT): [name], MD   Encounter Date: 11/02/2020   PT End of Session - 11/02/20 1304    Visit Number 4    Number of Visits 17    Date for PT Re-Evaluation 12/05/20    Authorization Type [employer] managed    Authorization Time Period Cert 5/36/64-11/26/45    PT Start Time 0858    PT Stop Time 0930    PT Time Calculation (min) 32 min    Equipment Utilized During Treatment Gait belt   iPad; treadmill; yellow TB; blue TB; AFO   Activity Tolerance Patient tolerated treatment well;Patient limited by fatigue    Behavior During Therapy [facility] for tasks assessed/performed           Past Medical History:  Diagnosis Date  . Allergy   . Anemia   . Bell's palsy   . Diabetes mellitus without complication (HCC)    diet controlled  . Hypertension   . Hypothyroidism   . Kidney stones   . Pseudotumor cerebri   . Stroke [facility])     Past Surgical History:  Procedure Laterality Date  . COLONOSCOPY WITH PROPOFOL N/A 03/30/2020   Procedure: COLONOSCOPY WITH PROPOFOL;  Surgeon: Lesly Rubenstein, MD;  Location: [facility] ENDOSCOPY;  Service: Endoscopy;  Laterality: N/A;  . LOOP RECORDER INSERTION N/A 01/27/2018   Procedure: LOOP RECORDER INSERTION;  Surgeon: [name], MD;  Location: [place] CV LAB;  Service: Cardiovascular;  Laterality: N/A;  . LUMBAR PUNCTURE     as child  . NO PAST SURGERIES    . TEE WITHOUT CARDIOVERSION N/A 01/07/2018   Procedure: TRANSESOPHAGEAL ECHOCARDIOGRAM (TEE);  Surgeon: Minna Merritts, MD;  Location: [facility] ORS;  Service: Cardiovascular;  Laterality: N/A;    There were no vitals filed for this visit.   Subjective Assessment - 11/02/20 0858    Subjective Pt reports fall on  Tuesday when ducking under some rope in parking lot to walk into building. Pt missed therapy to visit ED immediatley following fall. Pt reports no pain or injury but some soreness and scrapes. Pt reports feeling tired today.    Pertinent History Referred by PCP for unsteadiness of gait. PMH: anemia, CKD4, IDDM, HTN on amlodipine, hydralazin, losartan, lasix; HLD< hypoTSH, neuropathy with gait disturbance on Lyrica; COVID PNA hospitalization.    Currently in Pain? No/denies            Treatment  Pt skin examined, pt with scabs on B hands and lip from fall. Pt landed on knees and reports soreness in R and L knees.  Nustep level 3 x 5, RPE 4-5/10; At 5 min pt rates RPE 6-7/10 ; pt provided with VC for SPM >50s, VC to use only LEs   Standing heel raises 1x20 Split stance heel raises 1x15 each LE to emphasis individual LEs   STS 2x10 without UE assist (previously used UEs); pt rates exercise "difficult"   Assessment: Session significantly limited secondary to pt late arrival to appointment. Pt still with decreased activity tolerance, but did show progress compared to previous session by maintaining level 3 on nustep for full 5 minutes, and performing sit<>stands without UE support. Pt does still demonstrate posterior LOB upon initial stand requiring UE support to regain balance. Pt  would benefit from further skilled therapy to improve endurance, BLE strength and balance.  Provided pt with contact information for [employer] prost[facility] and orthotics.    Education provided throughout therapy via use of VC/TC and demonstration to facilitate correct muscle activation, body mechanics, and technique with exercises.   PT Short Term Goals - 10/12/20 1044      PT SHORT TERM GOAL #1   Title Pt to demonstrate improved 5xSTS in <15sec without LOB    Baseline 20sec with 3 LOB at eval    Time 4    Period Weeks    Status On-going    Target Date 11/07/20             PT Long Term Goals - 10/12/20 1045       PT LONG TERM GOAL #1   Title Pt to show improved FOTO score to >70.    Baseline FOTO: 59/100 at evaluation    Time 8    Period Weeks    Status On-going    Target Date 12/05/20      PT LONG TERM GOAL #2   Title Pt to demonstrate 5/5 ankle dorsiflexion bilat.    Baseline 4/5 bilat at eval    Time 8    Period Weeks    Status On-going    Target Date 12/05/20      PT LONG TERM GOAL #3   Title Pt to demnonstrate SLS bilat >30sec, shod, AFO as needed    Baseline <3sec at eval    Time 8    Period Weeks    Status On-going    Target Date 12/05/20                 Plan - 11/02/20 1307    Clinical Impression Statement Session significantly limited secondary to pt late arrival to appointment. Pt still with decreased activity tolerance, but did show progress compared to previous session by maintaining level 3 on nustep for full 5 minutes, and performing sit<>stands without UE support. Pt does still demonstrate posterior LOB upon initial stand requiring UE support to regain balance. Pt would benefit from further skilled therapy to improve endurance, BLE strength and balance.    Personal Factors and Comorbidities Age;Comorbidity 1;Fitness    Examination-Activity Limitations Locomotion Level;Transfers;Carry;Toileting;Stand;Sit    Examination-Participation Restrictions Cleaning;Meal Prep;Occupation    Stability/Clinical Decision Making Evolving/Moderate complexity    Rehab Potential Good    PT Frequency 2x / week    PT Duration 12 weeks    PT Treatment/Interventions Cryotherapy;Electrical Stimulation;Moist Heat;DME Instruction;Neuromuscular re-education;Therapeutic activities;Therapeutic exercise;Stair training;Gait training;Functional mobility training;Patient/family education;Orthotic Fit/Training    PT Next Visit Plan BBT; establish HEP for ankles and balance; initiate HEP, balance exercises; endurance exercises    PT Home Exercise Plan None at eval    Consulted and Agree with  Plan of Care Patient           Patient will benefit from skilled therapeutic intervention in order to improve the following deficits and impairments:  Abnormal gait,Decreased coordination,Decreased range of motion,Difficulty walking,Cardiopulmonary status limiting activity,Decreased activity tolerance,Decreased balance,Decreased knowledge of precautions,Decreased knowledge of use of DME,Decreased mobility,Decreased strength,Impaired sensation,Postural dysfunction  Visit Diagnosis: Muscle weakness (generalized)  Unsteadiness on feet  Other abnormalities of gait and mobility     Problem List Patient Active Problem List   Diagnosis Date Noted  . History of colon polyps 10/15/2020  . Postoperative hemorrhage involving digestive system following digestive system procedure 09/19/2020  . Acute renal failure superimposed on stage 4 chronic  kidney disease ([place]) 09/17/2020  . Acute cholecystitis without calculus 09/09/2020  . Type 2 diabetes mellitus, with long-term current use of insulin ([place]) 09/09/2020  . Cryptogenic stroke ([place]) 07/13/2020  . History of loop recorder 07/13/2020  . Weakness 06/18/2020  . History of 2019 novel coronavirus disease (COVID-19) 05/20/2020  . CKD (chronic kidney disease) stage 4, GFR 15-29 ml/min (HCC) 04/04/2020  . Pneumonia due to COVID-19 virus 04/03/2020  . AKI (acute kidney injury) ([place]) 04/03/2020  . Elevated troponin 04/03/2020  . Acquired trigger finger 06/08/2019  . Lymphedema 06/08/2019  . Anemia 04/17/2019  . Swelling of left lower extremity 01/10/2019  . Facial droop 04/09/2018  . Daytime somnolence 03/30/2018  . Carotid stenosis 02/03/2018  . Intracranial vascular stenosis 09/12/2017  . Cough 01/20/2017  . Bell's palsy 11/10/2016  . History of CVA (cerebrovascular accident) 11/10/2016  . 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 History of nephrolithiasis 10/27/2016  . TIA (transient ischemic  attack) 10/20/2016  . Near syncope 06/23/2016  . 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 Neuropathy 08/06/2015  . Health care maintenance 08/06/2015  . Essential hypertension 08/06/2015  . Heme positive stool 10/10/2013  . Hypothyroidism 10/10/2013  . Microalbuminuria 10/10/2013  . Hyperlipidemia 10/10/2013  . B12 deficiency 10/10/2013  . Diabetes ([place]) 07/04/2013  . Environmental allergies 07/04/2013   Ricard Dillon PT, DPT  Zollie Pee 11/02/2020, 1:10 PM  [place] [facility] SERVICES [address] [place], [place], [address] Phone: [phone]   Fax:  [phone]  Name: Gregory Crane MRN: [id number] Date of Birth: 24-Dec-1970

## 2020-11-07 ENCOUNTER — Ambulatory Visit: Payer: No Typology Code available for payment source

## 2020-11-09 ENCOUNTER — Ambulatory Visit: Payer: No Typology Code available for payment source

## 2020-11-09 ENCOUNTER — Other Ambulatory Visit: Payer: Self-pay

## 2020-11-09 DIAGNOSIS — M6281 Muscle weakness (generalized): Secondary | ICD-10-CM

## 2020-11-09 DIAGNOSIS — R2681 Unsteadiness on feet: Secondary | ICD-10-CM

## 2020-11-09 DIAGNOSIS — R2689 Other abnormalities of gait and mobility: Secondary | ICD-10-CM

## 2020-11-09 NOTE — Therapy (Signed)
[place] MAIN [facility] SERVICES [address] [place], [place], [address] Phone: [phone]   Fax:  [phone]  Physical Therapy Treatment  Patient Details  Name: Gregory Crane MRN: [id number] Date of Birth: 1971/06/08 Referring Provider (PT): [name], MD   Encounter Date: 11/09/2020   PT End of Session - 11/09/20 1441    Visit Number 5    Number of Visits 17    Date for PT Re-Evaluation 12/05/20    Authorization Type [employer] managed    Authorization Time Period Cert 2/69/48-5/46/27    PT Start Time 0850    PT Stop Time 0930    PT Time Calculation (min) 40 min    Equipment Utilized During Treatment Gait belt   iPad; treadmill; yellow TB; blue TB; AFO   Activity Tolerance Patient tolerated treatment well    Behavior During Therapy [facility] for tasks assessed/performed           Past Medical History:  Diagnosis Date  . Allergy   . Anemia   . Bell's palsy   . Diabetes mellitus without complication (HCC)    diet controlled  . Hypertension   . Hypothyroidism   . Kidney stones   . Pseudotumor cerebri   . Stroke [facility])     Past Surgical History:  Procedure Laterality Date  . COLONOSCOPY WITH PROPOFOL N/A 03/30/2020   Procedure: COLONOSCOPY WITH PROPOFOL;  Surgeon: Lesly Rubenstein, MD;  Location: [facility] ENDOSCOPY;  Service: Endoscopy;  Laterality: N/A;  . LOOP RECORDER INSERTION N/A 01/27/2018   Procedure: LOOP RECORDER INSERTION;  Surgeon: [name], MD;  Location: [place] CV LAB;  Service: Cardiovascular;  Laterality: N/A;  . LUMBAR PUNCTURE     as child  . NO PAST SURGERIES    . TEE WITHOUT CARDIOVERSION N/A 01/07/2018   Procedure: TRANSESOPHAGEAL ECHOCARDIOGRAM (TEE);  Surgeon: Minna Merritts, MD;  Location: [facility] ORS;  Service: Cardiovascular;  Laterality: N/A;    There were no vitals filed for this visit.   Subjective Assessment - 11/09/20 0854    Subjective Pt reports doing HEP. Pt states "I'm able to go up  the stairs a little easier in the house."  Pt reports no pain.    Pertinent History Referred by PCP for unsteadiness of gait. PMH: anemia, CKD4, IDDM, HTN on amlodipine, hydralazin, losartan, lasix; HLD< hypoTSH, neuropathy with gait disturbance on Lyrica; COVID PNA hospitalization.    Currently in Pain? No/denies           Treatment  Therex   Supervision for monitoring of vitals, RPE, and SPM with Nustep, no use of handles, LE focus: Nustep level 1 x3 min, RPE 4-5/10, HR 84 bpm, SPM 80s,  Level 2 x 3 min RPE 4-5/10, HR 81 bpm, maintained SPM in 70s At end of exercise RPE 5-6/10, HR 77 bpm  Standing heel raises 1x20  Eccentric focused heel raises with contralateral LE assist 1x8 B; pt rates this version is more difficult; Frequent VC and demonstration required   2# AW LAQ (seated) 3x15; Pt rates exercise as slightly more difficult on LLE  Neuro Re-ed:  Korebalance Tux racer 3x; BUE support to 3 finger support with CGA; pt with difficulty performing weight-shifts laterally with decreased UE support.   Airex pad, NBOS, with no UE support 2x60 sec; pt rates exercise as "difficult"   SLB 2x30 sec B LEs with intermittent UE support     Plan - 11/09/20 1446    Clinical Impression  Statement Pt shows improved tolerance with nustep this session by performing exercise for 6 minutes total with RPE of 4-5/10, HR 77-84 bpm and maintaining SPM in 70s-80s. This indicates improved functional capacity. Pt attempted Korebalance exercise to focus on ankle strategies and demonstrated difficulty with decreasing UE support. Pt will benefit from further skilled therapy to improve functional capacity, BLE strength and balance to improve safety wtih all functional mobility.    Personal Factors and Comorbidities Age;Comorbidity 1;Fitness    Examination-Activity Limitations Locomotion Level;Transfers;Carry;Toileting;Stand;Sit    Examination-Participation Restrictions Cleaning;Meal Prep;Occupation     Stability/Clinical Decision Making Evolving/Moderate complexity    Rehab Potential Good    PT Frequency 2x / week    PT Duration 12 weeks    PT Treatment/Interventions Cryotherapy;Electrical Stimulation;Moist Heat;DME Instruction;Neuromuscular re-education;Therapeutic activities;Therapeutic exercise;Stair training;Gait training;Functional mobility training;Patient/family education;Orthotic Fit/Training    PT Next Visit Plan BBT; establish HEP for ankles and balance; initiate HEP, balance exercises; endurance exercises; progress Korebalance    Consulted and Agree with Plan of Care Patient              PT Short Term Goals - 10/12/20 1044      PT SHORT TERM GOAL #1   Title Pt to demonstrate improved 5xSTS in <15sec without LOB    Baseline 20sec with 3 LOB at eval    Time 4    Period Weeks    Status On-going    Target Date 11/07/20             PT Long Term Goals - 10/12/20 1045      PT LONG TERM GOAL #1   Title Pt to show improved FOTO score to >70.    Baseline FOTO: 59/100 at evaluation    Time 8    Period Weeks    Status On-going    Target Date 12/05/20      PT LONG TERM GOAL #2   Title Pt to demonstrate 5/5 ankle dorsiflexion bilat.    Baseline 4/5 bilat at eval    Time 8    Period Weeks    Status On-going    Target Date 12/05/20      PT LONG TERM GOAL #3   Title Pt to demnonstrate SLS bilat >30sec, shod, AFO as needed    Baseline <3sec at eval    Time 8    Period Weeks    Status On-going    Target Date 12/05/20            Patient will benefit from skilled therapeutic intervention in order to improve the following deficits and impairments:  Abnormal gait,Decreased coordination,Decreased range of motion,Difficulty walking,Cardiopulmonary status limiting activity,Decreased activity tolerance,Decreased balance,Decreased knowledge of precautions,Decreased knowledge of use of DME,Decreased mobility,Decreased strength,Impaired sensation,Postural  dysfunction  Visit Diagnosis: Muscle weakness (generalized)  Other abnormalities of gait and mobility  Unsteadiness on feet     Problem List Patient Active Problem List   Diagnosis Date Noted  . History of colon polyps 10/15/2020  . Postoperative hemorrhage involving digestive system following digestive system procedure 09/19/2020  . Acute renal failure superimposed on stage 4 chronic kidney disease ([place]) 09/17/2020  . Acute cholecystitis without calculus 09/09/2020  . Type 2 diabetes mellitus, with long-term current use of insulin ([place]) 09/09/2020  . Cryptogenic stroke ([place]) 07/13/2020  . History of loop recorder 07/13/2020  . Weakness 06/18/2020  . History of 2019 novel coronavirus disease (COVID-19) 05/20/2020  . CKD (chronic kidney disease) stage 4, GFR 15-29 ml/min (HCC) 04/04/2020  . Pneumonia  due to COVID-19 virus 04/03/2020  . AKI (acute kidney injury) ([place]) 04/03/2020  . Elevated troponin 04/03/2020  . Acquired trigger finger 06/08/2019  . Lymphedema 06/08/2019  . Anemia 04/17/2019  . Swelling of left lower extremity 01/10/2019  . Facial droop 04/09/2018  . Daytime somnolence 03/30/2018  . Carotid stenosis 02/03/2018  . Intracranial vascular stenosis 09/12/2017  . Cough 01/20/2017  . Bell's palsy 11/10/2016  . History of CVA (cerebrovascular accident) 11/10/2016  . 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 History of nephrolithiasis 10/27/2016  . TIA (transient ischemic attack) 10/20/2016  . Near syncope 06/23/2016  . 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 Neuropathy 08/06/2015  . Health care maintenance 08/06/2015  . Essential hypertension 08/06/2015  . Heme positive stool 10/10/2013  . Hypothyroidism 10/10/2013  . Microalbuminuria 10/10/2013  . Hyperlipidemia 10/10/2013  . B12 deficiency 10/10/2013  . Diabetes ([place]) 07/04/2013  . Environmental allergies 07/04/2013   Ricard Dillon PT, DPT 11/09/2020, 2:51 PM  [place] MAIN [facility] SERVICES [address] [place], [place], [address] Phone: [phone]   Fax:  [phone]  Name: Taven Strite MRN: [id number] Date of Birth: 1971/08/26

## 2020-11-13 ENCOUNTER — Other Ambulatory Visit: Payer: Self-pay | Admitting: Internal Medicine

## 2020-11-14 ENCOUNTER — Ambulatory Visit: Payer: No Typology Code available for payment source

## 2020-11-14 ENCOUNTER — Other Ambulatory Visit: Payer: Self-pay

## 2020-11-14 DIAGNOSIS — R2681 Unsteadiness on feet: Secondary | ICD-10-CM | POA: Diagnosis not present

## 2020-11-14 DIAGNOSIS — M6281 Muscle weakness (generalized): Secondary | ICD-10-CM

## 2020-11-14 DIAGNOSIS — R2689 Other abnormalities of gait and mobility: Secondary | ICD-10-CM

## 2020-11-14 NOTE — Therapy (Signed)
[place] MAIN [facility] SERVICES [address] [place], [place], [address] Phone: [phone]   Fax:  [phone]  Physical Therapy Treatment  Patient Details  Name: Gregory Crane MRN: [id number] Date of Birth: [birth year] Referring Provider (PT): [name], MD   Encounter Date: 11/14/2020   PT End of Session - 11/14/20 1210    Visit Number 6    Number of Visits 17    Date for PT Re-Evaluation 12/05/20    Authorization Type [employer] managed    Authorization Time Period Cert 08/31/24-9/48/54    PT Start Time 0849    PT Stop Time 0929    PT Time Calculation (min) 40 min    Equipment Utilized During Treatment Gait belt   iPad; treadmill; yellow TB; blue TB; AFO   Activity Tolerance Patient tolerated treatment well    Behavior During Therapy [facility] for tasks assessed/performed           Past Medical History:  Diagnosis Date  . Allergy   . Anemia   . Bell's palsy   . Diabetes mellitus without complication (HCC)    diet controlled  . Hypertension   . Hypothyroidism   . Kidney stones   . Pseudotumor cerebri   . Stroke [facility])     Past Surgical History:  Procedure Laterality Date  . COLONOSCOPY WITH PROPOFOL N/A 03/30/2020   Procedure: COLONOSCOPY WITH PROPOFOL;  Surgeon: Lesly Rubenstein, MD;  Location: [facility] ENDOSCOPY;  Service: Endoscopy;  Laterality: N/A;  . LOOP RECORDER INSERTION N/A 01/27/2018   Procedure: LOOP RECORDER INSERTION;  Surgeon: [name], MD;  Location: [place] CV LAB;  Service: Cardiovascular;  Laterality: N/A;  . LUMBAR PUNCTURE     as child  . NO PAST SURGERIES    . TEE WITHOUT CARDIOVERSION N/A 01/07/2018   Procedure: TRANSESOPHAGEAL ECHOCARDIOGRAM (TEE);  Surgeon: Minna Merritts, MD;  Location: [facility] ORS;  Service: Cardiovascular;  Laterality: N/A;    There were no vitals filed for this visit.   Subjective Assessment - 11/14/20 0850    Subjective Pt reports he had a good weekend. Reports no pain.  No falls or near-falls reported.    Pertinent History Referred by PCP for unsteadiness of gait. PMH: anemia, CKD4, IDDM, HTN on amlodipine, hydralazin, losartan, lasix; HLD< hypoTSH, neuropathy with gait disturbance on Lyrica; COVID PNA hospitalization.    Currently in Pain? No/denies           TREATMENT Therex  Heel raises 2x20, VC for technique  Staggered heel raises 1x15 B LEs with VC/TC for technique as well as demonstration  Plantarflexion/dorsifleixon on rocker board x several reps, CGA as well as UEs on bar; Demonstration/VC for technique  Ankle adduction RTB 3x15 BLEs; VC/demonstration provided  Ankle abduction RTB 3x12 BLEs; VC/demonstration provided   2.5# AW LAQ (seated) 3x15  Nustep for LE endurance and functional capacity- Supervision for monitoring of vitals, RPE, and SPM, no use of handles, LE focus: Nustep level 1 x2 min, RPE 3/10, HR 76 bpm, SPM 80s,  Level 2 x 3 min RPE 4-5/10, HR 77-8 bpm, maintained SPM in 70s-80s Level 1 x 2 min,  RPE 5/10, SPM 70s-80s HR 77 Level 0 x 1 min with cuing to decrease SPM slowly, RPE 4/10  At end with pt at rest:  RPE 1/10  HR 74 Pt reports feeling fatigue in B LEs at end   Seated hamstring and calf stretch 2x30 sec each LE  Assessment: Pt able  to tolerate longer time on nustep today following standing and seated therex, indicating further improved activity tolerance. Pt maintained HR in 70s and RPE 3-5/10 throughout but reported fatigue in LEs. Pt will benefit from further skilled therapy to improve BLE strength, endurance and activity tolerance to improve ease with ADLs and QOL.     PT Short Term Goals - 10/12/20 1044      PT SHORT TERM GOAL #1   Title Pt to demonstrate improved 5xSTS in <15sec without LOB    Baseline 20sec with 3 LOB at eval    Time 4    Period Weeks    Status On-going    Target Date 11/07/20             PT Long Term Goals - 10/12/20 1045      PT LONG TERM GOAL #1   Title Pt to show improved  FOTO score to >70.    Baseline FOTO: 59/100 at evaluation    Time 8    Period Weeks    Status On-going    Target Date 12/05/20      PT LONG TERM GOAL #2   Title Pt to demonstrate 5/5 ankle dorsiflexion bilat.    Baseline 4/5 bilat at eval    Time 8    Period Weeks    Status On-going    Target Date 12/05/20      PT LONG TERM GOAL #3   Title Pt to demnonstrate SLS bilat >30sec, shod, AFO as needed    Baseline <3sec at eval    Time 8    Period Weeks    Status On-going    Target Date 12/05/20                 Plan - 11/14/20 1214    Clinical Impression Statement Pt able to tolerate longer time on nustep today following standing and seated therex, indicating further improved activity tolerance. Pt maintained HR in 70s and RPE 3-5/10 throughout but reported fatigue in LEs. Pt will benefit from further skilled therapy to improve BLE strength, endurance and activity tolerance to improve ease with ADLs and QOL.    Personal Factors and Comorbidities Age;Comorbidity 1;Fitness    Examination-Activity Limitations Locomotion Level;Transfers;Carry;Toileting;Stand;Sit    Examination-Participation Restrictions Cleaning;Meal Prep;Occupation    Stability/Clinical Decision Making Evolving/Moderate complexity    Rehab Potential Good    PT Frequency 2x / week    PT Duration 12 weeks    PT Treatment/Interventions Cryotherapy;Electrical Stimulation;Moist Heat;DME Instruction;Neuromuscular re-education;Therapeutic activities;Therapeutic exercise;Stair training;Gait training;Functional mobility training;Patient/family education;Orthotic Fit/Training    PT Next Visit Plan BBT; establish HEP for ankles and balance; initiate HEP, balance exercises; endurance exercises; progress Korebalance; continue to progress LE endurance/cardiovasc endurance    Consulted and Agree with Plan of Care Patient           Patient will benefit from skilled therapeutic intervention in order to improve the following  deficits and impairments:  Abnormal gait,Decreased coordination,Decreased range of motion,Difficulty walking,Cardiopulmonary status limiting activity,Decreased activity tolerance,Decreased balance,Decreased knowledge of precautions,Decreased knowledge of use of DME,Decreased mobility,Decreased strength,Impaired sensation,Postural dysfunction  Visit Diagnosis: Muscle weakness (generalized)  Other abnormalities of gait and mobility     Problem List Patient Active Problem List   Diagnosis Date Noted  . History of colon polyps 10/15/2020  . Postoperative hemorrhage involving digestive system following digestive system procedure 09/19/2020  . Acute renal failure superimposed on stage 4 chronic kidney disease ([place]) 09/17/2020  . Acute cholecystitis without calculus 09/09/2020  .  Type 2 diabetes mellitus, with long-term current use of insulin ([place]) 09/09/2020  . Cryptogenic stroke ([place]) 07/13/2020  . History of loop recorder 07/13/2020  . Weakness 06/18/2020  . History of 2019 novel coronavirus disease (COVID-19) 05/20/2020  . CKD (chronic kidney disease) stage 4, GFR 15-29 ml/min (HCC) 04/04/2020  . Pneumonia due to COVID-19 virus 04/03/2020  . AKI (acute kidney injury) ([place]) 04/03/2020  . Elevated troponin 04/03/2020  . Acquired trigger finger 06/08/2019  . Lymphedema 06/08/2019  . Anemia 04/17/2019  . Swelling of left lower extremity 01/10/2019  . Facial droop 04/09/2018  . Daytime somnolence 03/30/2018  . Carotid stenosis 02/03/2018  . Intracranial vascular stenosis 09/12/2017  . Cough 01/20/2017  . Bell's palsy 11/10/2016  . History of CVA (cerebrovascular accident) 11/10/2016  . 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 History of nephrolithiasis 10/27/2016  . TIA (transient ischemic attack) 10/20/2016  . Near syncope 06/23/2016  . 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 Neuropathy 08/06/2015  . Health care maintenance 08/06/2015  . Essential  hypertension 08/06/2015  . Heme positive stool 10/10/2013  . Hypothyroidism 10/10/2013  . Microalbuminuria 10/10/2013  . Hyperlipidemia 10/10/2013  . B12 deficiency 10/10/2013  . Diabetes ([place]) 07/04/2013  . Environmental allergies 07/04/2013   Ricard Dillon PT, DPT 11/14/2020, 12:15 PM  [place] MAIN [facility] SERVICES [address] [place], [place], [address] Phone: [phone]   Fax:  [phone]  Name: Maximillion Gill MRN: [id number] Date of Birth: September 12, 1970

## 2020-11-15 ENCOUNTER — Other Ambulatory Visit: Payer: Self-pay | Admitting: Internal Medicine

## 2020-11-16 ENCOUNTER — Ambulatory Visit: Payer: No Typology Code available for payment source

## 2020-11-21 ENCOUNTER — Other Ambulatory Visit: Payer: Self-pay

## 2020-11-21 ENCOUNTER — Ambulatory Visit: Payer: No Typology Code available for payment source

## 2020-11-21 DIAGNOSIS — R2681 Unsteadiness on feet: Secondary | ICD-10-CM

## 2020-11-21 DIAGNOSIS — R2689 Other abnormalities of gait and mobility: Secondary | ICD-10-CM

## 2020-11-21 DIAGNOSIS — M6281 Muscle weakness (generalized): Secondary | ICD-10-CM

## 2020-11-21 NOTE — Therapy (Signed)
[place] MAIN [facility] SERVICES [address] [place], [place], [address] Phone: [phone]   Fax:  [phone]  Physical Therapy Treatment  Patient Details  Name: Gregory Crane MRN: [id number] Date of Birth: [birth year] Referring Provider (PT): [name], MD   Encounter Date: 11/21/2020   PT End of Session - 11/21/20 0850    Visit Number 7    Number of Visits 17    Date for PT Re-Evaluation 12/05/20    Authorization Type [employer] managed    Authorization Time Period Cert 7/78/24-2/35/36    PT Start Time 0845    PT Stop Time 0928    PT Time Calculation (min) 43 min    Equipment Utilized During Treatment Gait belt   iPad; treadmill; yellow TB; blue TB; AFO   Activity Tolerance Patient tolerated treatment well    Behavior During Therapy [facility] for tasks assessed/performed           Past Medical History:  Diagnosis Date  . Allergy   . Anemia   . Bell's palsy   . Diabetes mellitus without complication (HCC)    diet controlled  . Hypertension   . Hypothyroidism   . Kidney stones   . Pseudotumor cerebri   . Stroke [facility])     Past Surgical History:  Procedure Laterality Date  . COLONOSCOPY WITH PROPOFOL N/A 03/30/2020   Procedure: COLONOSCOPY WITH PROPOFOL;  Surgeon: Lesly Rubenstein, MD;  Location: [facility] ENDOSCOPY;  Service: Endoscopy;  Laterality: N/A;  . LOOP RECORDER INSERTION N/A 01/27/2018   Procedure: LOOP RECORDER INSERTION;  Surgeon: [name], MD;  Location: [place] [facility] LAB;  Service: Cardiovascular;  Laterality: N/A;  . LUMBAR PUNCTURE     as child  . NO PAST SURGERIES    . TEE WITHOUT CARDIOVERSION N/A 01/07/2018   Procedure: TRANSESOPHAGEAL ECHOCARDIOGRAM (TEE);  Surgeon: Minna Merritts, MD;  Location: [facility] ORS;  Service: Cardiovascular;  Laterality: N/A;    There were no vitals filed for this visit.   Subjective Assessment - 11/21/20 0843    Subjective Pt reports no sleep last night. Pt says he has no  pain today. Pt denies any falls or near-falls. Pt states "I feel a little bit stronger," and reports easier time going up his stairs.    Pertinent History Referred by PCP for unsteadiness of gait. PMH: anemia, CKD4, IDDM, HTN on amlodipine, hydralazin, losartan, lasix; HLD< hypoTSH, neuropathy with gait disturbance on Lyrica; COVID PNA hospitalization.    Currently in Pain? No/denies           TREATMENT Therex   Standing marches with 3# AW 2x20; VC for technique/decrease compensation with hip ERs   3# AW LAQ (seated) 3x15; Pt rates between easy-medium difficulty.   Nustep for LE endurance and functional capacity- x10 min Supervision for monitoring of vitals, RPE, and SPM, no use of handles, LE focus: Nustep level 1 x2 min, RPE 3/10, HR 80 bpm, SPM 80s-100s,  Level 2 x 5 min RPE 4/10, HR 85-87 bpm, maintained SPM in 80s-100s  Level 1 x 2 min,  RPE 5/10, SPM 90s HR  Level 1 x 1 min with cuing to decrease SPM slowly 90s-50s, RPE 5-6/10  At end with pt at rest:  RPE 3-4/10  HR 82 Pt reports feeling fatigue in B LEs at end   Seated hamstring and calf stretch 2x30 sec each LE; VC for technique   Neuro:  Feet together on airex pad 2x60 sec BLEs;  pt requires intermittent UE support  SLB - 2x60 sec BLES; pt reports L side more challenging; pt requires intermittent UE support   Korebalance: Tux racer 3x for agility with weight-shifts all planes with ankle strategy; UUE support, CGA  KoreMaze - 2x longer, sustained weight-shifts, ankle strategy all planes UUE support, CGA  Assessment: Progressed level of resistance used with therex this session. Pt also shows progress with increasing SPM on nustep with comparable RPE to previous sessions. Pt requires intermittent UE support with balance tasks and has difficulty scaling weight-shifts in all planes on Korebalance, though pt did show improvement with repetition. Pt will benefit from further skilled therapy to improve LE strength, endurance  and balance to increase ease and safety with ADLs.       PT Education - 11/21/20 1105    Education Details exercise technique and body mechanics with korebalance exercises    Person(s) Educated Patient    Methods Explanation;Demonstration;Verbal cues    Comprehension Verbalized understanding;Returned demonstration            PT Short Term Goals - 10/12/20 1044      PT SHORT TERM GOAL #1   Title Pt to demonstrate improved 5xSTS in <15sec without LOB    Baseline 20sec with 3 LOB at eval    Time 4    Period Weeks    Status On-going    Target Date 11/07/20             PT Long Term Goals - 10/12/20 1045      PT LONG TERM GOAL #1   Title Pt to show improved FOTO score to >70.    Baseline FOTO: 59/100 at evaluation    Time 8    Period Weeks    Status On-going    Target Date 12/05/20      PT LONG TERM GOAL #2   Title Pt to demonstrate 5/5 ankle dorsiflexion bilat.    Baseline 4/5 bilat at eval    Time 8    Period Weeks    Status On-going    Target Date 12/05/20      PT LONG TERM GOAL #3   Title Pt to demnonstrate SLS bilat >30sec, shod, AFO as needed    Baseline <3sec at eval    Time 8    Period Weeks    Status On-going    Target Date 12/05/20                 Plan - 11/21/20 1109    Clinical Impression Statement Progressed level of resistance used with therex this session. Pt also shows progress with increasing SPM on nustep with comparable RPE to previous sessions. Pt requires intermittent UE support with balance tasks and has difficulty scaling weight-shifts in all planes on Korebalance, though pt did show improvement with repetition. Pt will benefit from further skilled therapy to improve LE strength, endurance and balance to increase ease and safety with ADLs.    Personal Factors and Comorbidities Age;Comorbidity 1;Fitness    Examination-Activity Limitations Locomotion Level;Transfers;Carry;Toileting;Stand;Sit    Examination-Participation Restrictions  Cleaning;Meal Prep;Occupation    Stability/Clinical Decision Making Evolving/Moderate complexity    Rehab Potential Good    PT Frequency 2x / week    PT Duration 12 weeks    PT Treatment/Interventions Cryotherapy;Electrical Stimulation;Moist Heat;DME Instruction;Neuromuscular re-education;Therapeutic activities;Therapeutic exercise;Stair training;Gait training;Functional mobility training;Patient/family education;Orthotic Fit/Training    PT Next Visit Plan BBT; establish HEP for ankles and balance; initiate HEP, balance exercises; endurance exercises; progress Korebalance; continue  to progress LE endurance/cardiovasc endurance, treadmill gait training    Consulted and Agree with Plan of Care Patient           Patient will benefit from skilled therapeutic intervention in order to improve the following deficits and impairments:  Abnormal gait,Decreased coordination,Decreased range of motion,Difficulty walking,Cardiopulmonary status limiting activity,Decreased activity tolerance,Decreased balance,Decreased knowledge of precautions,Decreased knowledge of use of DME,Decreased mobility,Decreased strength,Impaired sensation,Postural dysfunction  Visit Diagnosis: Muscle weakness (generalized)  Other abnormalities of gait and mobility  Unsteadiness on feet     Problem List Patient Active Problem List   Diagnosis Date Noted  . History of colon polyps 10/15/2020  . Postoperative hemorrhage involving digestive system following digestive system procedure 09/19/2020  . Acute renal failure superimposed on stage 4 chronic kidney disease ([place]) 09/17/2020  . Acute cholecystitis without calculus 09/09/2020  . Type 2 diabetes mellitus, with long-term current use of insulin ([place]) 09/09/2020  . Cryptogenic stroke ([place]) 07/13/2020  . History of loop recorder 07/13/2020  . Weakness 06/18/2020  . History of 2019 novel coronavirus disease (COVID-19) 05/20/2020  . CKD (chronic kidney disease) stage 4, GFR  15-29 ml/min (HCC) 04/04/2020  . Pneumonia due to COVID-19 virus 04/03/2020  . AKI (acute kidney injury) ([place]) 04/03/2020  . Elevated troponin 04/03/2020  . Acquired trigger finger 06/08/2019  . Lymphedema 06/08/2019  . Anemia 04/17/2019  . Swelling of left lower extremity 01/10/2019  . Facial droop 04/09/2018  . Daytime somnolence 03/30/2018  . Carotid stenosis 02/03/2018  . Intracranial vascular stenosis 09/12/2017  . Cough 01/20/2017  . Bell's palsy 11/10/2016  . History of CVA (cerebrovascular accident) 11/10/2016  . 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 History of nephrolithiasis 10/27/2016  . TIA (transient ischemic attack) 10/20/2016  . Near syncope 06/23/2016  . 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 Neuropathy 08/06/2015  . Health care maintenance 08/06/2015  . Essential hypertension 08/06/2015  . Heme positive stool 10/10/2013  . Hypothyroidism 10/10/2013  . Microalbuminuria 10/10/2013  . Hyperlipidemia 10/10/2013  . B12 deficiency 10/10/2013  . Diabetes ([place]) 07/04/2013  . Environmental allergies 07/04/2013   Ricard Dillon PT, DPT 11/21/2020, 11:11 AM  [place] MAIN [facility] SERVICES [address] [place], [place], [address] Phone: [phone]   Fax:  [phone]  Name: Gregory Crane MRN: [id number] Date of Birth: [birth year]

## 2020-11-23 ENCOUNTER — Other Ambulatory Visit: Payer: Self-pay

## 2020-11-23 ENCOUNTER — Ambulatory Visit: Payer: No Typology Code available for payment source

## 2020-11-23 DIAGNOSIS — R2689 Other abnormalities of gait and mobility: Secondary | ICD-10-CM

## 2020-11-23 DIAGNOSIS — R2681 Unsteadiness on feet: Secondary | ICD-10-CM | POA: Diagnosis not present

## 2020-11-23 DIAGNOSIS — M6281 Muscle weakness (generalized): Secondary | ICD-10-CM

## 2020-11-23 NOTE — Therapy (Signed)
[place] MAIN [facility] SERVICES [address] [place], [place], [address] Phone: [phone]   Fax:  [phone]  Physical Therapy Treatment  Patient Details  Name: Gregory Crane MRN: [id number] Date of Birth: 04-06-1971 Referring Provider (PT): [name], MD   Encounter Date: 11/23/2020   PT End of Session - 11/23/20 1449    Visit Number 8    Number of Visits 17    Date for PT Re-Evaluation 12/05/20    Authorization Type [employer] managed    Authorization Time Period Cert 1/32/44-0/10/27    PT Start Time 0841    PT Stop Time 0929    PT Time Calculation (min) 48 min    Equipment Utilized During Treatment Gait belt   iPad; treadmill; yellow TB; blue TB; AFO   Activity Tolerance Patient tolerated treatment well    Behavior During Therapy [facility] for tasks assessed/performed           Past Medical History:  Diagnosis Date  . Allergy   . Anemia   . Bell's palsy   . Diabetes mellitus without complication (HCC)    diet controlled  . Hypertension   . Hypothyroidism   . Kidney stones   . Pseudotumor cerebri   . Stroke [facility])     Past Surgical History:  Procedure Laterality Date  . COLONOSCOPY WITH PROPOFOL N/A 03/30/2020   Procedure: COLONOSCOPY WITH PROPOFOL;  Surgeon: Lesly Rubenstein, MD;  Location: [facility] ENDOSCOPY;  Service: Endoscopy;  Laterality: N/A;  . LOOP RECORDER INSERTION N/A 01/27/2018   Procedure: LOOP RECORDER INSERTION;  Surgeon: [name], MD;  Location: [place] CV LAB;  Service: Cardiovascular;  Laterality: N/A;  . LUMBAR PUNCTURE     as child  . NO PAST SURGERIES    . TEE WITHOUT CARDIOVERSION N/A 01/07/2018   Procedure: TRANSESOPHAGEAL ECHOCARDIOGRAM (TEE);  Surgeon: Minna Merritts, MD;  Location: [facility] ORS;  Service: Cardiovascular;  Laterality: N/A;    There were no vitals filed for this visit.   Subjective Assessment - 11/23/20 0839    Subjective Pt reports no pain today. Pt reports performing  HEP.    Pertinent History Referred by PCP for unsteadiness of gait. PMH: anemia, CKD4, IDDM, HTN on amlodipine, hydralazin, losartan, lasix; HLD< hypoTSH, neuropathy with gait disturbance on Lyrica; COVID PNA hospitalization.    Currently in Pain? No/denies          TREATMENT  Therapeutic Exercise:    Treadmill - RPE  Scale used throughout At 1.5 min pt at 1.1 mph, RPE 3/10, HR 84 bpm At 3 min, 1.4 mph, RPE 4-5/10, HR 83 bpm At 4.5 min pt 1.4 mph, RPE 4-5/10, HR 90 bpm From 4.5-6 min pt was decreased back to 1.1 mph, RPE 5/10, HR 88-90 bpm. End results Gait speed average 0.55 m/s  Steps on average 0.65 cycles/sec  Step length averages: RLE 0.41 m, LLE 0.43 m   Penguin walks - 1x8, 1x6 for mm endurance of the dorsiflexors, CGA; pt rates exercise "moderate"  Seated hamstring and calf stretch 2x30 sec each LE; VC for technique   Seated dorsiflexion with 5# AW 1x8, 2x10  Heel raises 3x15-18, UEs on bar, limited ROM throughout   Heel raises for strength/power with 5# AW - 2x8; noted increase in LLE supination, VC for pt to stop exercise once form starts breaking down  Education provided throughout primarily via VC/demonstration to promote movement at target joints and correct muscle activation with exercises.  Assessment:  Progressed pt to treadmill exercise for mm and cardiovascular endurance. His RPE throughout ranged from 3-5/10, and HR ranged from 83-90 bpm. Pt overall with improved activity tolerance and endurance. Pt did require frequent cuing to perform ankle exercises targeting dorsiflexors/plantarflexors. He was educated to stop performing reps at home with heel raises once LLE form breaks down and pt shows excessive supination. The pt will benefit from further skilled PT to improve endurance, strength and gait ability.   Plan - 11/23/20 1457    Clinical Impression Statement Progressed pt to treadmill exercise for mm and cardiovascular endurance. His RPE throughout ranged from  3-5/10, and HR ranged from 83-90 bpm. Pt overall with improved activity tolerance and endurance. Pt did require frequent cuing to perform ankle exercises targeting dorsiflexors/plantarflexors. He was educated to stop performing reps at home with heel raises once LLE form breaks down and pt shows excessive supination. The pt will benefit from further skilled PT to improve endurance, strength and gait ability.    Personal Factors and Comorbidities Age;Comorbidity 1;Fitness    Examination-Activity Limitations Locomotion Level;Transfers;Carry;Toileting;Stand;Sit    Examination-Participation Restrictions Cleaning;Meal Prep;Occupation    Stability/Clinical Decision Making Evolving/Moderate complexity    Rehab Potential Good    PT Frequency 2x / week    PT Duration 12 weeks    PT Treatment/Interventions Cryotherapy;Electrical Stimulation;Moist Heat;DME Instruction;Neuromuscular re-education;Therapeutic activities;Therapeutic exercise;Stair training;Gait training;Functional mobility training;Patient/family education;Orthotic Fit/Training    PT Next Visit Plan BBT; establish HEP for ankles and balance; initiate HEP, balance exercises; endurance exercises; progress Korebalance; continue to progress LE endurance/cardiovasc endurance, treadmill gait training, dorsiflexion exercises/motor control    Consulted and Agree with Plan of Care Patient              PT Education - 11/23/20 1449    Education Details exercise technique with penguin walks/dorsiflexion walk, body mechanics    Person(s) Educated Patient    Methods Explanation;Verbal cues;Demonstration    Comprehension Verbalized understanding;Returned demonstration            PT Short Term Goals - 10/12/20 1044      PT SHORT TERM GOAL #1   Title Pt to demonstrate improved 5xSTS in <15sec without LOB    Baseline 20sec with 3 LOB at eval    Time 4    Period Weeks    Status On-going    Target Date 11/07/20             PT Long Term Goals  - 10/12/20 1045      PT LONG TERM GOAL #1   Title Pt to show improved FOTO score to >70.    Baseline FOTO: 59/100 at evaluation    Time 8    Period Weeks    Status On-going    Target Date 12/05/20      PT LONG TERM GOAL #2   Title Pt to demonstrate 5/5 ankle dorsiflexion bilat.    Baseline 4/5 bilat at eval    Time 8    Period Weeks    Status On-going    Target Date 12/05/20      PT LONG TERM GOAL #3   Title Pt to demnonstrate SLS bilat >30sec, shod, AFO as needed    Baseline <3sec at eval    Time 8    Period Weeks    Status On-going    Target Date 12/05/20            Patient will benefit from skilled therapeutic intervention in order to improve the following deficits  and impairments:  Abnormal gait,Decreased coordination,Decreased range of motion,Difficulty walking,Cardiopulmonary status limiting activity,Decreased activity tolerance,Decreased balance,Decreased knowledge of precautions,Decreased knowledge of use of DME,Decreased mobility,Decreased strength,Impaired sensation,Postural dysfunction  Visit Diagnosis: Other abnormalities of gait and mobility  Muscle weakness (generalized)  Unsteadiness on feet     Problem List Patient Active Problem List   Diagnosis Date Noted  . History of colon polyps 10/15/2020  . Postoperative hemorrhage involving digestive system following digestive system procedure 09/19/2020  . Acute renal failure superimposed on stage 4 chronic kidney disease ([place]) 09/17/2020  . Acute cholecystitis without calculus 09/09/2020  . Type 2 diabetes mellitus, with long-term current use of insulin ([place]) 09/09/2020  . Cryptogenic stroke ([place]) 07/13/2020  . History of loop recorder 07/13/2020  . Weakness 06/18/2020  . History of 2019 novel coronavirus disease (COVID-19) 05/20/2020  . CKD (chronic kidney disease) stage 4, GFR 15-29 ml/min (HCC) 04/04/2020  . Pneumonia due to COVID-19 virus 04/03/2020  . AKI (acute kidney injury) ([place]) 04/03/2020   . Elevated troponin 04/03/2020  . Acquired trigger finger 06/08/2019  . Lymphedema 06/08/2019  . Anemia 04/17/2019  . Swelling of left lower extremity 01/10/2019  . Facial droop 04/09/2018  . Daytime somnolence 03/30/2018  . Carotid stenosis 02/03/2018  . Intracranial vascular stenosis 09/12/2017  . Cough 01/20/2017  . Bell's palsy 11/10/2016  . History of CVA (cerebrovascular accident) 11/10/2016  . 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 History of nephrolithiasis 10/27/2016  . TIA (transient ischemic attack) 10/20/2016  . Near syncope 06/23/2016  . 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 Neuropathy 08/06/2015  . Health care maintenance 08/06/2015  . Essential hypertension 08/06/2015  . Heme positive stool 10/10/2013  . Hypothyroidism 10/10/2013  . Microalbuminuria 10/10/2013  . Hyperlipidemia 10/10/2013  . B12 deficiency 10/10/2013  . Diabetes ([place]) 07/04/2013  . Environmental allergies 07/04/2013    Ricard Dillon PT, DPT 11/23/2020, 2:59 PM  [place] MAIN [facility] SERVICES [address] [place], [place], [address] Phone: [phone]   Fax:  [phone]  Name: Gregory Crane MRN: [id number] Date of Birth: 31-Jan-1971

## 2020-11-28 ENCOUNTER — Other Ambulatory Visit: Payer: Self-pay

## 2020-11-28 ENCOUNTER — Ambulatory Visit: Payer: No Typology Code available for payment source | Attending: Internal Medicine

## 2020-11-28 DIAGNOSIS — R269 Unspecified abnormalities of gait and mobility: Secondary | ICD-10-CM | POA: Insufficient documentation

## 2020-11-28 DIAGNOSIS — M6281 Muscle weakness (generalized): Secondary | ICD-10-CM | POA: Diagnosis present

## 2020-11-28 DIAGNOSIS — R2681 Unsteadiness on feet: Secondary | ICD-10-CM | POA: Diagnosis present

## 2020-11-28 DIAGNOSIS — R278 Other lack of coordination: Secondary | ICD-10-CM | POA: Insufficient documentation

## 2020-11-28 DIAGNOSIS — R2689 Other abnormalities of gait and mobility: Secondary | ICD-10-CM | POA: Diagnosis present

## 2020-11-28 DIAGNOSIS — R262 Difficulty in walking, not elsewhere classified: Secondary | ICD-10-CM | POA: Insufficient documentation

## 2020-11-28 IMAGING — CT CT ELBOW*R* W/O CM
2 series · 10 of 14 positions shown, 12 images · non-contrast
Comparison: None.

CLINICAL DATA: 46-year-old male with swelling of the right elbow.
Concern for abscess.

EXAM:
CT OF THE LOWER RIGHT EXTREMITY WITHOUT CONTRAST
TECHNIQUE: Multidetector CT imaging of the right lower extremity was performed
according to the standard protocol.

[Series 4: axial bone · axial · 0.27mm/px · z∈[-250,-122]mm · 5 of 207 slices shown, 7 images]
[im 35/207  soft-tissue]
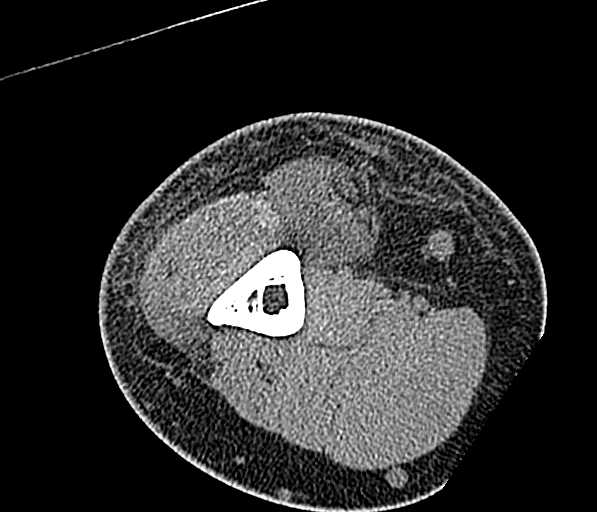
[im 35/207  bone]
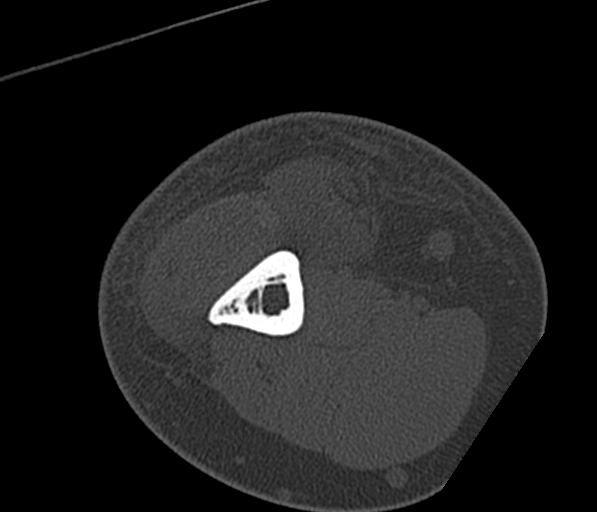
[im 69/207  bone]
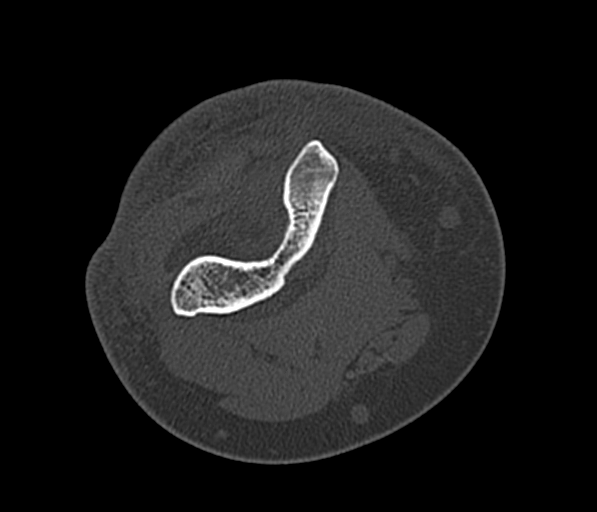
[im 104/207  bone]
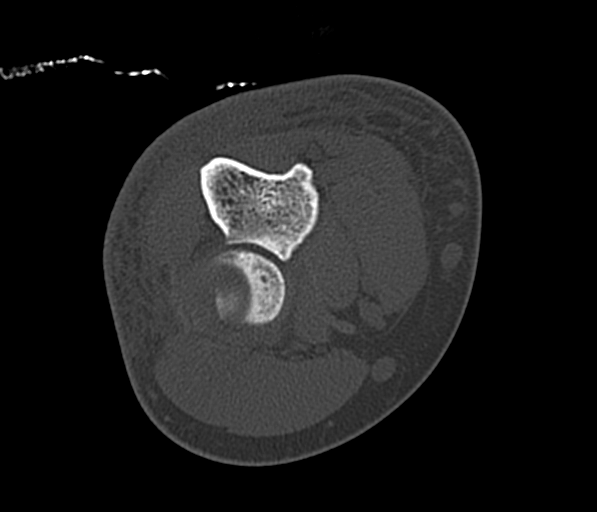
[im 138/207  bone]
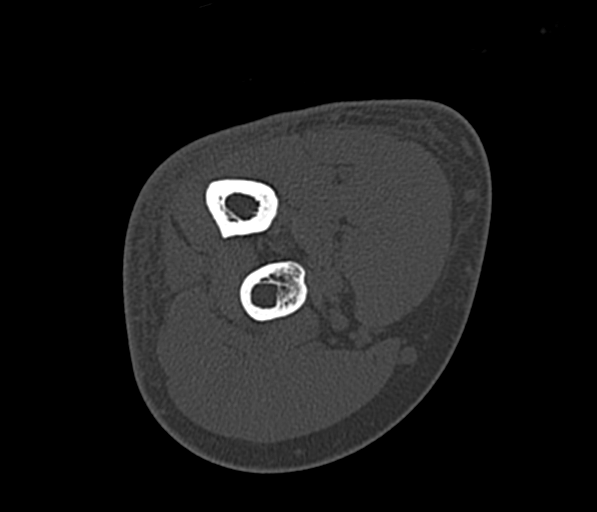
[im 172/207  soft-tissue]
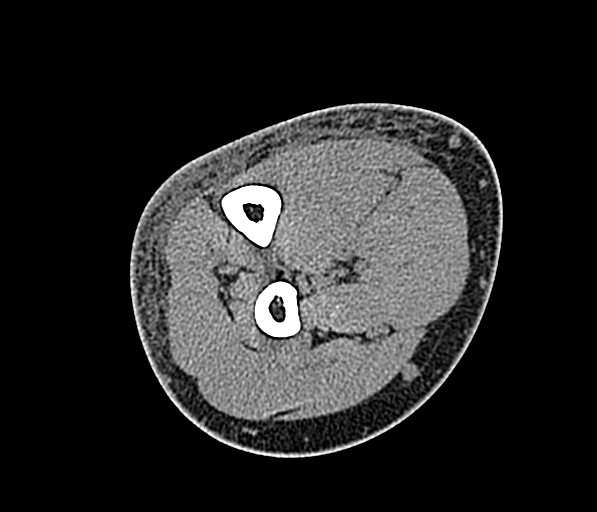
[im 172/207  bone]
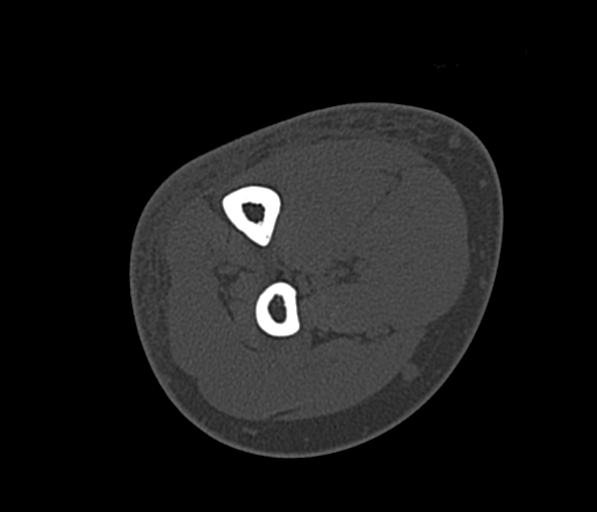

[Series 7: axial st · axial · 0.25mm/px · z∈[-264,-124]mm · 5 of 220 slices shown]
[im 37/220  bone]
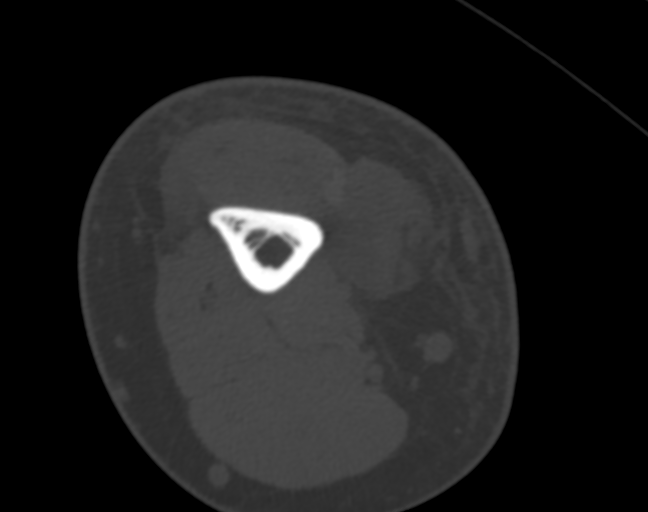
[im 74/220  bone]
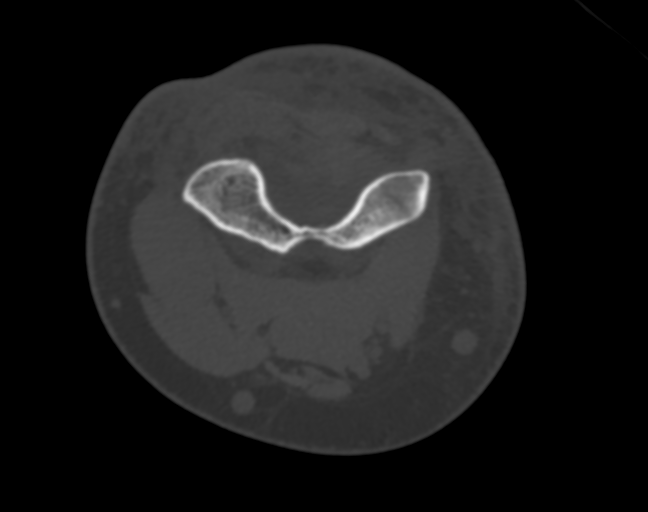
[im 110/220  bone]
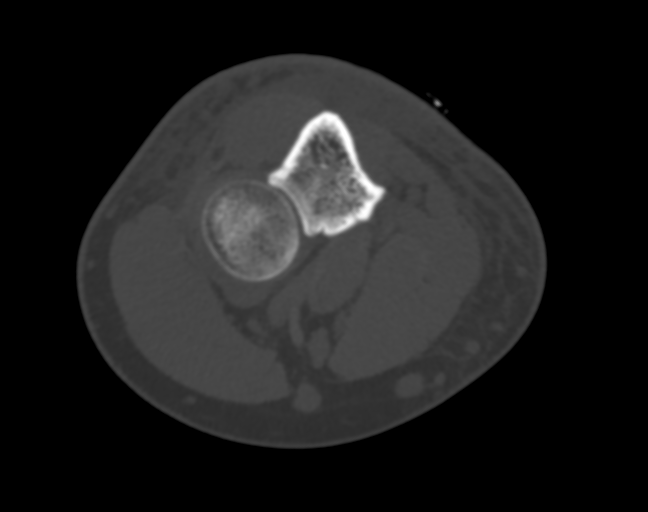
[im 147/220  bone]
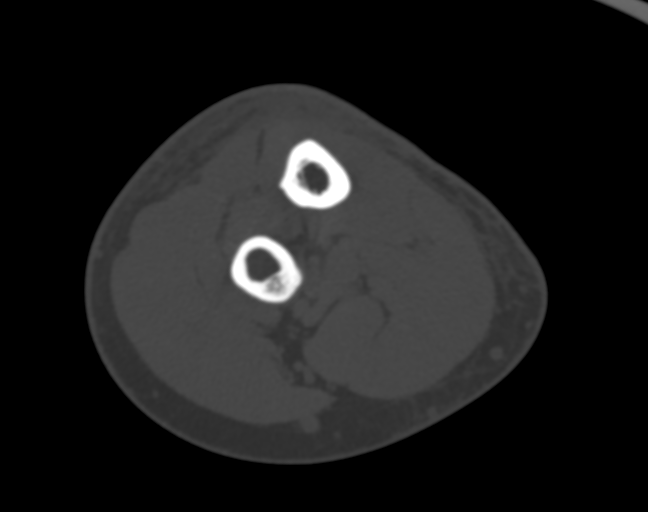
[im 183/220  bone]
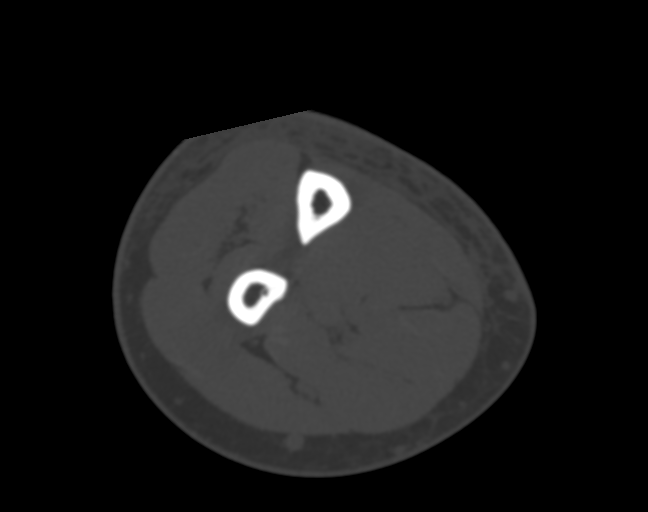

[10 of 14 positions shown; findings below may reference images not displayed]

FINDINGS: Bones/Joint/Cartilage

There is no acute fracture or dislocation. No significant arthritic
changes. There is moderate joint effusion with elevation of the
anterior and posterior fat pads.

Ligaments

Suboptimally assessed by CT.

Muscles and Tendons

No acute findings.

Soft tissues

There is skin thickening with diffuse subcutaneous edema of the
dorsal aspect of the elbow which may represent cellulitis. No
drainable fluid collection or abscess.
IMPRESSION: 1. No acute fracture or dislocation.
2. Moderate joint effusion with elevation of the anterior and
posterior fat pads.
3. Skin thickening with diffuse subcutaneous edema of the dorsal
aspect of the elbow which may represent cellulitis. No drainable
fluid collection or abscess.

## 2020-11-28 NOTE — Therapy (Signed)
[place] MAIN [facility] SERVICES [address] [place], [place], [address] Phone: [phone]   Fax:  [phone]  Physical Therapy Treatment  Patient Details  Name: Gregory Crane MRN: [id number] Date of Birth: [birth year] Referring Provider (PT): [name], MD   Encounter Date: 11/28/2020   PT End of Session - 11/28/20 1255    Visit Number 9    Number of Visits 17    Date for PT Re-Evaluation 12/05/20    Authorization Type [employer] managed    Authorization Time Period Cert 2/75/17-0/01/74    PT Start Time 1130    PT Stop Time 1216    PT Time Calculation (min) 46 min    Equipment Utilized During Treatment Gait belt   iPad; treadmill; yellow TB; blue TB; AFO   Activity Tolerance Patient tolerated treatment well    Behavior During Therapy [facility] for tasks assessed/performed           Past Medical History:  Diagnosis Date  . Allergy   . Anemia   . Bell's palsy   . Diabetes mellitus without complication (HCC)    diet controlled  . Hypertension   . Hypothyroidism   . Kidney stones   . Pseudotumor cerebri   . Stroke [facility])     Past Surgical History:  Procedure Laterality Date  . COLONOSCOPY WITH PROPOFOL N/A 03/30/2020   Procedure: COLONOSCOPY WITH PROPOFOL;  Surgeon: Lesly Rubenstein, MD;  Location: [facility] ENDOSCOPY;  Service: Endoscopy;  Laterality: N/A;  . LOOP RECORDER INSERTION N/A 01/27/2018   Procedure: LOOP RECORDER INSERTION;  Surgeon: [name], MD;  Location: [place] CV LAB;  Service: Cardiovascular;  Laterality: N/A;  . LUMBAR PUNCTURE     as child  . NO PAST SURGERIES    . TEE WITHOUT CARDIOVERSION N/A 01/07/2018   Procedure: TRANSESOPHAGEAL ECHOCARDIOGRAM (TEE);  Surgeon: Minna Merritts, MD;  Location: [facility] ORS;  Service: Cardiovascular;  Laterality: N/A;    There were no vitals filed for this visit.   Subjective Assessment - 11/28/20 1135    Subjective Pt reports no pain. Pt reports he is starting diet  and exercise program with a fitness trainer.    Pertinent History Referred by PCP for unsteadiness of gait. PMH: anemia, CKD4, IDDM, HTN on amlodipine, hydralazin, losartan, lasix; HLD< hypoTSH, neuropathy with gait disturbance on Lyrica; COVID PNA hospitalization.    Currently in Pain? No/denies          TREATMENT Therapeutic Exercise:    Treadmill - RPE  Scale used throughout x10 min, CGA throughout 0.7 mph, x2 min RPE 2/10, HR  79 bpm 1.1 mph x2 min, RPE 3/10, HR 88 bpm 1.4 mph at x2 min, RPE 4/10, HR 97 bpm 1.6 mph x2 min, RPE 5/10, HR 105 bpm 1.3 at x1 min RPE 5/10, HR 111 bpm 0.7 at x1 min, RPE 2/10    Penguin walks - 1x8, for mm endurance of the dorsiflexors, CGA; pt rates exercise "moderate"  Seated hamstring and calf stretch 2x30 sec each LE; VC for technique; pt reports LLE feeling tight    Seated dorsiflexion with 5# AW 2x20; pt rates "medium"  Neuro Re-ED  Tandem walk x multiple reps, intermittent UE support and CGA; demo/VC for technique  One LE on dynadisc the other LE on airex pad 2x30 sec for each, intermittent UE support, CGA  Standing on airex pad NBOS 2x30 sec, CGA  Standing on airex pad, tandem stance, intermittent UE support 2x30 sec  each LE, CGA  Education provided throughout session via VC/TC/demonstration to promote movement at target joints and correct muscle activation with exercises.  Assessment: Pt continues to progress endurance on treadmill by ambulating up to 1.6 mph with RPE remaining no higher than 5/10 throughout. Pt still with difficulty with majority of static balance exercises. The pt will benefit from further skilled therapy to improve endurance, BLE strength and balance.    Plan - 11/28/20 1300    Clinical Impression Statement Pt continues to progress endurance on treadmill by ambulating up to 1.6 mph with RPE remaining no higher than 5/10 throughout. Pt still with difficulty with majority of static balance exercises. The pt will benefit  from further skilled therapy to improve endurance, BLE strength and balance.    Personal Factors and Comorbidities Age;Comorbidity 1;Fitness    Examination-Activity Limitations Locomotion Level;Transfers;Carry;Toileting;Stand;Sit    Examination-Participation Restrictions Cleaning;Meal Prep;Occupation    Stability/Clinical Decision Making Evolving/Moderate complexity    Rehab Potential Good    PT Frequency 2x / week    PT Duration 12 weeks    PT Treatment/Interventions Cryotherapy;Electrical Stimulation;Moist Heat;DME Instruction;Neuromuscular re-education;Therapeutic activities;Therapeutic exercise;Stair training;Gait training;Functional mobility training;Patient/family education;Orthotic Fit/Training    PT Next Visit Plan reassess goals    Consulted and Agree with Plan of Care Patient              PT Short Term Goals - 10/12/20 1044      PT SHORT TERM GOAL #1   Title Pt to demonstrate improved 5xSTS in <15sec without LOB    Baseline 20sec with 3 LOB at eval    Time 4    Period Weeks    Status On-going    Target Date 11/07/20             PT Long Term Goals - 10/12/20 1045      PT LONG TERM GOAL #1   Title Pt to show improved FOTO score to >70.    Baseline FOTO: 59/100 at evaluation    Time 8    Period Weeks    Status On-going    Target Date 12/05/20      PT LONG TERM GOAL #2   Title Pt to demonstrate 5/5 ankle dorsiflexion bilat.    Baseline 4/5 bilat at eval    Time 8    Period Weeks    Status On-going    Target Date 12/05/20      PT LONG TERM GOAL #3   Title Pt to demnonstrate SLS bilat >30sec, shod, AFO as needed    Baseline <3sec at eval    Time 8    Period Weeks    Status On-going    Target Date 12/05/20            Patient will benefit from skilled therapeutic intervention in order to improve the following deficits and impairments:  Abnormal gait,Decreased coordination,Decreased range of motion,Difficulty walking,Cardiopulmonary status limiting  activity,Decreased activity tolerance,Decreased balance,Decreased knowledge of precautions,Decreased knowledge of use of DME,Decreased mobility,Decreased strength,Impaired sensation,Postural dysfunction  Visit Diagnosis: Muscle weakness (generalized)  Unsteadiness on feet  Other abnormalities of gait and mobility     Problem List Patient Active Problem List   Diagnosis Date Noted  . History of colon polyps 10/15/2020  . Postoperative hemorrhage involving digestive system following digestive system procedure 09/19/2020  . Acute renal failure superimposed on stage 4 chronic kidney disease ([place]) 09/17/2020  . Acute cholecystitis without calculus 09/09/2020  . Type 2 diabetes mellitus, with long-term current use of insulin (  [place]) 09/09/2020  . Cryptogenic stroke ([place]) 07/13/2020  . History of loop recorder 07/13/2020  . Weakness 06/18/2020  . History of 2019 novel coronavirus disease (COVID-19) 05/20/2020  . CKD (chronic kidney disease) stage 4, GFR 15-29 ml/min (HCC) 04/04/2020  . Pneumonia due to COVID-19 virus 04/03/2020  . AKI (acute kidney injury) ([place]) 04/03/2020  . Elevated troponin 04/03/2020  . Acquired trigger finger 06/08/2019  . Lymphedema 06/08/2019  . Anemia 04/17/2019  . Swelling of left lower extremity 01/10/2019  . Facial droop 04/09/2018  . Daytime somnolence 03/30/2018  . Carotid stenosis 02/03/2018  . Intracranial vascular stenosis 09/12/2017  . Cough 01/20/2017  . Bell's palsy 11/10/2016  . History of CVA (cerebrovascular accident) 11/10/2016  . 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 History of nephrolithiasis 10/27/2016  . TIA (transient ischemic attack) 10/20/2016  . Near syncope 06/23/2016  . 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 Neuropathy 08/06/2015  . Health care maintenance 08/06/2015  . Essential hypertension 08/06/2015  . Heme positive stool 10/10/2013  . Hypothyroidism 10/10/2013  . Microalbuminuria  10/10/2013  . Hyperlipidemia 10/10/2013  . B12 deficiency 10/10/2013  . Diabetes ([place]) 07/04/2013  . Environmental allergies 07/04/2013   Ricard Dillon PT, DPT 11/28/2020, 2:08 PM  [place] MAIN [facility] SERVICES [address] [place], [place], [address] Phone: [phone]   Fax:  [phone]  Name: Gregory Crane MRN: [id number] Date of Birth: 03-24-1971

## 2020-12-01 ENCOUNTER — Ambulatory Visit (INDEPENDENT_AMBULATORY_CARE_PROVIDER_SITE_OTHER): Payer: No Typology Code available for payment source | Admitting: Nurse Practitioner

## 2020-12-01 ENCOUNTER — Other Ambulatory Visit: Payer: Self-pay

## 2020-12-01 VITALS — BP 182/77 | HR 71 | Ht 72.0 in | Wt 280.0 lb

## 2020-12-01 DIAGNOSIS — E785 Hyperlipidemia, unspecified: Secondary | ICD-10-CM

## 2020-12-01 DIAGNOSIS — I890 Lymphedema, not elsewhere classified: Secondary | ICD-10-CM

## 2020-12-01 DIAGNOSIS — Z794 Long term (current) use of insulin: Secondary | ICD-10-CM | POA: Diagnosis not present

## 2020-12-01 DIAGNOSIS — E11319 Type 2 diabetes mellitus with unspecified diabetic retinopathy without macular edema: Secondary | ICD-10-CM

## 2020-12-04 ENCOUNTER — Encounter (INDEPENDENT_AMBULATORY_CARE_PROVIDER_SITE_OTHER): Payer: Self-pay | Admitting: Nurse Practitioner

## 2020-12-04 ENCOUNTER — Encounter: Payer: No Typology Code available for payment source | Admitting: Internal Medicine

## 2020-12-04 DIAGNOSIS — Z9889 Other specified postprocedural states: Secondary | ICD-10-CM

## 2020-12-04 DIAGNOSIS — I639 Cerebral infarction, unspecified: Secondary | ICD-10-CM

## 2020-12-04 NOTE — Progress Notes (Signed)
Subjective:    Patient ID: Gregory Crane, male    DOB: May 29, 1971, 50 y.o.   MRN: [id number] Chief Complaint  Patient presents with  . Follow-up    6 Mo no studies    The patient returns to the office for followup evaluation regarding leg swelling.  The swelling has improved quite a bit and the pain associated with swelling has decreased substantially. There have not been any interval development of a ulcerations or wounds.  Since the previous visit the patient has been wearing graduated compression stockings and has noted little significant improvement in the lymphedema. The patient has been using compression routinely morning until night.  The patient also states elevation during the day and exercise is being done too.      Review of Systems  Cardiovascular: Positive for leg swelling.  All other systems reviewed and are negative.      Objective:   Physical Exam Vitals reviewed.  HENT:     Head: Normocephalic.  Cardiovascular:     Rate and Rhythm: Normal rate.     Pulses: Normal pulses.  Pulmonary:     Effort: Pulmonary effort is normal.  Musculoskeletal:     Right lower leg: 1+ Edema present.     Left lower leg: 1+ Edema present.  Neurological:     Mental Status: He is alert and oriented to person, place, and time.  Psychiatric:        Mood and Affect: Mood normal.        Behavior: Behavior normal.        Thought Content: Thought content normal.        Judgment: Judgment normal.     BP (!) 182/77   Pulse 71   Ht 6' (1.829 m)   Wt 280 lb (127 kg)   BMI 37.97 kg/m   Past Medical History:  Diagnosis Date  . Allergy   . Anemia   . Bell's palsy   . Diabetes mellitus without complication (HCC)    diet controlled  . Hypertension   . Hypothyroidism   . Kidney stones   . Pseudotumor cerebri   . Stroke [facility])     Social History   Socioeconomic History  . Marital status: Married    Spouse name: [name]  . Number of children: 2  . Years of education:  Not on file  . Highest education level: Not on file  Occupational History  . Not on file  Tobacco Use  . Smoking status: Never Smoker  . Smokeless tobacco: Never Used  Vaping Use  . Vaping Use: Never used  Substance and Sexual Activity  . Alcohol use: No    Alcohol/week: 0.0 standard drinks    Comment: very rarely (1-2/year)  . Drug use: No  . Sexual activity: Yes  Other Topics Concern  . Not on file  Social History Narrative   Lives at home with wife, independent at baseline.   Social Determinants of Health   Financial Resource Strain: Low Risk   . Difficulty of Paying Living Expenses: Not hard at all  Food Insecurity: Not on file  Transportation Needs: Not on file  Physical Activity: Not on file  Stress: Not on file  Social Connections: Not on file  Intimate Partner Violence: Not on file    Past Surgical History:  Procedure Laterality Date  . COLONOSCOPY WITH PROPOFOL N/A 03/30/2020   Procedure: COLONOSCOPY WITH PROPOFOL;  Surgeon: Lesly Rubenstein, MD;  Location: [facility] ENDOSCOPY;  Service: Endoscopy;  Laterality: N/A;  . LOOP RECORDER INSERTION N/A 01/27/2018   Procedure: LOOP RECORDER INSERTION;  Surgeon: [name], MD;  Location: [place] CV LAB;  Service: Cardiovascular;  Laterality: N/A;  . LUMBAR PUNCTURE     as child  . NO PAST SURGERIES    . TEE WITHOUT CARDIOVERSION N/A 01/07/2018   Procedure: TRANSESOPHAGEAL ECHOCARDIOGRAM (TEE);  Surgeon: Minna Merritts, MD;  Location: [facility] ORS;  Service: Cardiovascular;  Laterality: N/A;    Family History  Problem Relation Age of Onset  . Breast cancer Mother   . Diabetes Father   . Diabetes Sister   . Arthritis Maternal Grandmother   . Diabetes Maternal Grandmother     No Known Allergies  CBC Latest Ref Rng & Units 08/15/2020 05/16/2020 04/24/2020  WBC 4.0 - 10.5 K/uL 7.5 5.1 5.2  Hemoglobin 13.0 - 17.0 g/dL 11.6(L) 9.2 Repeated and verified X2.(L) 8.5(L)  Hematocrit 39.0 - 52.0 % 34.4(L) 27.2(L)  25.7(L)  Platelets 150.0 - 400.0 K/uL 218.0 167.0 133(L)      CMP     Component Value Date/Time   NA 142 08/15/2020 1221   NA 144 04/24/2020 1513   NA 136 01/26/2014 1928   K 5.0 08/15/2020 1221   K 4.0 01/26/2014 1928   CL 108 08/15/2020 1221   CL 102 01/26/2014 1928   CO2 23 08/15/2020 1221   CO2 27 01/26/2014 1928   GLUCOSE 250 (H) 08/15/2020 1221   GLUCOSE 217 (H) 01/26/2014 1928   BUN 66 (H) 08/15/2020 1221   BUN 35 (H) 04/24/2020 1513   BUN 9 01/26/2014 1928   CREATININE 3.52 (H) 08/15/2020 1221   CREATININE 0.76 01/26/2014 1928   CALCIUM 9.0 08/15/2020 1221   CALCIUM 9.0 01/26/2014 1928   PROT 7.6 08/15/2020 1221   PROT 5.8 (L) 02/22/2020 1154   ALBUMIN 4.3 08/15/2020 1221   ALBUMIN 3.3 (L) 02/22/2020 1154   AST 18 08/15/2020 1221   ALT 15 08/15/2020 1221   ALKPHOS 97 08/15/2020 1221   BILITOT 0.4 08/15/2020 1221   BILITOT 0.2 02/22/2020 1154   GFRNONAA 30 (L) 04/24/2020 1513   GFRNONAA >60 01/26/2014 1928   GFRAA 35 (L) 04/24/2020 1513   GFRAA >60 01/26/2014 1928     No results found.     Assessment & Plan:   1. Lymphedema No surgery or intervention at this point in time.  The patient's swelling has remained stable.  Unfortunately his insurance has not approved the use of a lymphedema pump but despite this he does well with conservative therapy.  He denies any new wounds or ulcerations.  Based on this we will have the patient follow-up on an as-needed basis if the swelling becomes worse.  He is reminded to continue to utilize his medical grade 1 compression stockings daily, elevation as well as exercise to help with swelling.  2. Hyperlipidemia, unspecified hyperlipidemia type Continue statin as ordered and reviewed, no changes at this time   3. Type 2 diabetes mellitus with retinopathy, with long-term current use of insulin, macular edema presence unspecified, unspecified laterality, unspecified retinopathy severity ([place]) Continue hypoglycemic  medications as already ordered, these medications have been reviewed and there are no changes at this time.  Hgb A1C to be monitored as already arranged by primary service    Current Outpatient Medications on File Prior to Visit  Medication Sig Dispense Refill  . Accu-Chek FastClix Lancets MISC USE TO CHECK BLOOD SUGAR THREE TIMES DAILY AS DIRECTED 306 each 100  .  amLODipine (NORVASC) 10 MG tablet Take 1 tablet (10 mg total) by mouth daily. 90 tablet 1  . ascorbic acid (VITAMIN C) 500 MG tablet Take 1 tablet (500 mg total) by mouth daily. 90 tablet 0  . aspirin (ASPIRIN LOW DOSE) 81 MG EC tablet TAKE 1 TABLET(81 MG) BY MOUTH DAILY 90 tablet 1  . atorvastatin (LIPITOR) 80 MG tablet TAKE 1 TABLET(80 MG) BY MOUTH DAILY 90 tablet 0  . cholecalciferol (VITAMIN D3) 25 MCG (1000 UNIT) tablet Take 1,000 Units by mouth daily.    . clopidogrel (PLAVIX) 75 MG tablet TAKE 1 TABLET(75 MG) BY MOUTH DAILY 90 tablet 2  . Continuous Blood Gluc Sensor (FREESTYLE LIBRE 2 SENSOR) MISC Use to check glucose at least TID 2 each 11  . cyanocobalamin (,VITAMIN B-12,) 1000 MCG/ML injection Inject 1 mL into the skin once a week for 3 weeks then once every 30 days. 10 mL 0  . DULoxetine (CYMBALTA) 20 MG capsule TAKE 1 CAPSULE(20 MG) BY MOUTH DAILY 30 capsule 1  . furosemide (LASIX) 20 MG tablet TAKE 3 TABLETS(60 MG) BY MOUTH TWICE DAILY 540 tablet 3  . glucose blood (FREESTYLE LITE) test strip Check blood sugars twice a day (Dx. 250.02) 200 each 1  . HUMALOG 100 UNIT/ML injection INJECT AS DIRECTED PER SLIDING SCALE 70-200: 0 UNITS, 201-250: 1 UNIT, 251-300: 2 UNITS, 301-350: 3 UNITS,>350: 4 UNITS 10 mL 1  . hydrALAZINE (APRESOLINE) 50 MG tablet TAKE 1 TABLET(50 MG) BY MOUTH THREE TIMES DAILY 90 tablet 2  . insulin glargine (LANTUS) 100 UNIT/ML injection ADMINISTER 5 UNITS UNDER THE SKIN EVERY NIGHT 10 mL 0  . Insulin Syringe-Needle U-100 (INSULIN SYRINGE .5CC/30GX5/16") 30G X 5/16" 0.5 ML MISC USE AS DIRECTED WITH  INSULIN 300 each 5  . levothyroxine (SYNTHROID) 150 MCG tablet Take 1 tablet (150 mcg total) by mouth daily before breakfast. 30 tablet 2  . losartan (COZAAR) 100 MG tablet TAKE 1 TABLET BY MOUTH DAILY 90 tablet 1  . metolazone (ZAROXOLYN) 2.5 MG tablet Take 2.5 mg by mouth. Twice weekly    . potassium chloride SA (KLOR-CON) 20 MEQ tablet Take 20 mEq by mouth.    . pregabalin (LYRICA) 100 MG capsule TAKE 1 CAPSULE(100 MG) BY MOUTH THREE TIMES DAILY 270 capsule 1  . Semaglutide, 1 MG/DOSE, (OZEMPIC, 1 MG/DOSE,) 4 MG/3ML SOPN Inject 0.75 mLs (1 mg total) into the skin once a week. 9 mL 3  . Syringe/Needle, Disp, (SYRINGE 3CC/25GX1") 25G X 1" 3 ML MISC Use as directed to administer b12 injections. 50 each 1  . zinc sulfate 220 (50 Zn) MG capsule Take 1 capsule (220 mg total) by mouth daily. 90 capsule 0   No current facility-administered medications on file prior to visit.    There are no Patient Instructions on file for this visit. No follow-ups on file.   Kris Hartmann, NP

## 2020-12-13 ENCOUNTER — Ambulatory Visit: Payer: No Typology Code available for payment source

## 2020-12-13 ENCOUNTER — Other Ambulatory Visit: Payer: Self-pay | Admitting: Internal Medicine

## 2020-12-14 ENCOUNTER — Ambulatory Visit: Payer: No Typology Code available for payment source

## 2020-12-14 ENCOUNTER — Other Ambulatory Visit: Payer: Self-pay

## 2020-12-14 DIAGNOSIS — M6281 Muscle weakness (generalized): Secondary | ICD-10-CM

## 2020-12-14 DIAGNOSIS — R2681 Unsteadiness on feet: Secondary | ICD-10-CM

## 2020-12-14 DIAGNOSIS — R2689 Other abnormalities of gait and mobility: Secondary | ICD-10-CM

## 2020-12-14 NOTE — Therapy (Signed)
[place] MAIN [facility] SERVICES [address] [place], [place], [address] Phone: [phone]   Fax:  [phone]  Physical Therapy Treatment/RECERT/ Physical Therapy Progress Note   Dates of reporting period  10/10/20   to   12/14/20  Patient Details  Name: Gregory Crane MRN: [id number] Date of Birth: 1971-04-29 Referring Provider (PT): [name], MD   Encounter Date: 12/14/2020   PT End of Session - 12/14/20 1446    Visit Number 10    Number of Visits 26    Date for PT Re-Evaluation 02/08/21    Authorization Type [employer] managed; next session 1/10 PN 4/21    Authorization Time Period Cert 3/50/09-3/81/82    PT Start Time 1358    PT Stop Time 1430    PT Time Calculation (min) 32 min    Equipment Utilized During Treatment Gait belt   iPad; treadmill; yellow TB; blue TB; AFO   Activity Tolerance Patient tolerated treatment well    Behavior During Therapy [facility] for tasks assessed/performed           Past Medical History:  Diagnosis Date  . Allergy   . Anemia   . Bell's palsy   . Diabetes mellitus without complication (HCC)    diet controlled  . Hypertension   . Hypothyroidism   . Kidney stones   . Pseudotumor cerebri   . Stroke [facility])     Past Surgical History:  Procedure Laterality Date  . COLONOSCOPY WITH PROPOFOL N/A 03/30/2020   Procedure: COLONOSCOPY WITH PROPOFOL;  Surgeon: Lesly Rubenstein, MD;  Location: [facility] ENDOSCOPY;  Service: Endoscopy;  Laterality: N/A;  . LOOP RECORDER INSERTION N/A 01/27/2018   Procedure: LOOP RECORDER INSERTION;  Surgeon: [name], MD;  Location: [place] CV LAB;  Service: Cardiovascular;  Laterality: N/A;  . LUMBAR PUNCTURE     as child  . NO PAST SURGERIES    . TEE WITHOUT CARDIOVERSION N/A 01/07/2018   Procedure: TRANSESOPHAGEAL ECHOCARDIOGRAM (TEE);  Surgeon: Minna Merritts, MD;  Location: [facility] ORS;  Service: Cardiovascular;  Laterality: N/A;    There were no vitals filed  for this visit.   Subjective Assessment - 12/14/20 1440    Subjective Patient reports no falls since last session. He missed last session due to confusion on time of session. He was dizzy yesterday but is feeling better today.    Pertinent History Referred by PCP for unsteadiness of gait. PMH: anemia, CKD4, IDDM, HTN on amlodipine, hydralazin, losartan, lasix; HLD< hypoTSH, neuropathy with gait disturbance on Lyrica; COVID PNA hospitalization.    Currently in Pain? No/denies                     Goals: 5x STS: 15.05 two near LOB FOTO: 56.2%  Ankle strength: 4+/5 left; 4/5 R pf 4+/5 df  SLS : 4 seconds L 8 seconds R; very challenging for patient  6 min walk test: 1185 ft with walking stick ; increased ankle instability with prolonged ambulation   Treatment:  RTB four way ankle 10x each direction    Education provided throughout session via VC/TC/demonstration to promote movement at target joints and correct muscle activation with exercises.  Patient's condition has the potential to improve in response to therapy. Maximum improvement is yet to be obtained. The anticipated improvement is attainable and reasonable in a generally predictable time.  Patient reports he is getting better but not where he wants to be yet.   Patient's session  is limited by late arrival time. He performs his goals with improved metrics and allows for progression of mobility goal of six minute walk test. He continues to have limited ankle stability He is highly motivated to progress and has a good prognosis. Patient's condition has the potential to improve in response to therapy. Maximum improvement is yet to be obtained. The anticipated improvement is attainable and reasonable in a generally predictable time. The pt will benefit from further skilled therapy to improve endurance, BLE strength and balance.                  PT Education - 12/14/20 1445    Education Details exercise technique,  body mechanics, goals, POC    Person(s) Educated Patient    Methods Explanation;Demonstration;Tactile cues;Verbal cues    Comprehension Verbalized understanding;Returned demonstration;Verbal cues required;Tactile cues required            PT Short Term Goals - 12/14/20 1450      PT SHORT TERM GOAL #1   Title Pt to demonstrate improved 5xSTS in <15sec without LOB    Baseline 20sec with 3 LOB at eval 4/21: 15.05 seconds with two near LOB    Time 4    Period Weeks    Status Partially Met    Target Date 01/11/21             PT Long Term Goals - 12/14/20 1409      PT LONG TERM GOAL #1   Title Pt to show improved FOTO score to >70.    Baseline FOTO: 59/100 at evaluation 4/21: 56.2%    Time 8    Period Weeks    Status On-going    Target Date 02/08/21      PT LONG TERM GOAL #2   Title Pt to demonstrate 5/5 ankle dorsiflexion bilat.    Baseline 4/5 bilat at eval 4/21:4+/5 left; 4/5 R pf 4+/5 df    Time 8    Period Weeks    Status Partially Met    Target Date 02/08/21      PT LONG TERM GOAL #3   Title Pt to demnonstrate SLS bilat >30sec, shod, AFO as needed    Baseline <3sec at eval 4/21: 4 seconds L 8 seconds R    Time 8    Period Weeks    Status Partially Met    Target Date 02/08/21      PT LONG TERM GOAL #4   Title Patient will increase six minute walk test distance to >1600 for progression to age norm community ambulator and improve gait ability    Baseline 4/21: 1185 ft with walking stick    Time 8    Period Weeks    Status Partially Met    Target Date 02/08/21                 Plan - 12/14/20 1450    Clinical Impression Statement Patient's session is limited by late arrival time. He performs his goals with improved metrics and allows for progression of mobility goal of six minute walk test. He continues to have limited ankle stability He is highly motivated to progress and has a good prognosis. Patient's condition has the potential to improve in response  to therapy. Maximum improvement is yet to be obtained. The anticipated improvement is attainable and reasonable in a generally predictable time. The pt will benefit from further skilled therapy to improve endurance, BLE strength and balance.    Personal Factors  and Comorbidities Age;Comorbidity 1;Fitness    Examination-Activity Limitations Locomotion Level;Transfers;Carry;Toileting;Stand;Sit    Examination-Participation Restrictions Cleaning;Meal Prep;Occupation    Stability/Clinical Decision Making Evolving/Moderate complexity    Rehab Potential Good    PT Frequency 2x / week    PT Duration 12 weeks    PT Treatment/Interventions Cryotherapy;Electrical Stimulation;Moist Heat;DME Instruction;Neuromuscular re-education;Therapeutic activities;Therapeutic exercise;Stair training;Gait training;Functional mobility training;Patient/family education;Orthotic Fit/Training    PT Next Visit Plan ankle stability and mobility    Consulted and Agree with Plan of Care Patient           Patient will benefit from skilled therapeutic intervention in order to improve the following deficits and impairments:  Abnormal gait,Decreased coordination,Decreased range of motion,Difficulty walking,Cardiopulmonary status limiting activity,Decreased activity tolerance,Decreased balance,Decreased knowledge of precautions,Decreased knowledge of use of DME,Decreased mobility,Decreased strength,Impaired sensation,Postural dysfunction  Visit Diagnosis: Muscle weakness (generalized)  Unsteadiness on feet  Other abnormalities of gait and mobility     Problem List Patient Active Problem List   Diagnosis Date Noted  . History of colon polyps 10/15/2020  . Postoperative hemorrhage involving digestive system following digestive system procedure 09/19/2020  . Acute renal failure superimposed on stage 4 chronic kidney disease ([place]) 09/17/2020  . Acute cholecystitis without calculus 09/09/2020  . Type 2 diabetes mellitus,  with long-term current use of insulin ([place]) 09/09/2020  . Cryptogenic stroke ([place]) 07/13/2020  . History of loop recorder 07/13/2020  . Weakness 06/18/2020  . History of 2019 novel coronavirus disease (COVID-19) 05/20/2020  . CKD (chronic kidney disease) stage 4, GFR 15-29 ml/min (HCC) 04/04/2020  . Pneumonia due to COVID-19 virus 04/03/2020  . AKI (acute kidney injury) ([place]) 04/03/2020  . Elevated troponin 04/03/2020  . Acquired trigger finger 06/08/2019  . Lymphedema 06/08/2019  . Anemia 04/17/2019  . Swelling of left lower extremity 01/10/2019  . Facial droop 04/09/2018  . Daytime somnolence 03/30/2018  . Carotid stenosis 02/03/2018  . Intracranial vascular stenosis 09/12/2017  . Cough 01/20/2017  . Bell's palsy 11/10/2016  . History of CVA (cerebrovascular accident) 11/10/2016  . 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 History of nephrolithiasis 10/27/2016  . TIA (transient ischemic attack) 10/20/2016  . Near syncope 06/23/2016  . 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 Neuropathy 08/06/2015  . Health care maintenance 08/06/2015  . Essential hypertension 08/06/2015  . Heme positive stool 10/10/2013  . Hypothyroidism 10/10/2013  . Microalbuminuria 10/10/2013  . Hyperlipidemia 10/10/2013  . B12 deficiency 10/10/2013  . Diabetes ([place]) 07/04/2013  . Environmental allergies 07/04/2013   Janna Arch, PT, DPT    12/14/2020, 2:53 PM  [place] MAIN [facility] SERVICES [address] [place], [place], [address] Phone: [phone]   Fax:  [phone]  Name: Gregory Crane MRN: [id number] Date of Birth: 16-Mar-1971

## 2020-12-18 ENCOUNTER — Ambulatory Visit: Payer: Managed Care, Other (non HMO) | Admitting: Internal Medicine

## 2020-12-18 DIAGNOSIS — Z0289 Encounter for other administrative examinations: Secondary | ICD-10-CM

## 2020-12-19 ENCOUNTER — Ambulatory Visit: Payer: No Typology Code available for payment source

## 2020-12-19 ENCOUNTER — Other Ambulatory Visit: Payer: Self-pay | Admitting: Internal Medicine

## 2020-12-19 ENCOUNTER — Other Ambulatory Visit: Payer: Self-pay

## 2020-12-19 DIAGNOSIS — R2689 Other abnormalities of gait and mobility: Secondary | ICD-10-CM

## 2020-12-19 DIAGNOSIS — M6281 Muscle weakness (generalized): Secondary | ICD-10-CM | POA: Diagnosis not present

## 2020-12-19 DIAGNOSIS — R2681 Unsteadiness on feet: Secondary | ICD-10-CM

## 2020-12-19 NOTE — Therapy (Signed)
[place] MAIN [facility] SERVICES [address] [place], [place], [address] Phone: [phone]   Fax:  [phone]  Physical Therapy Treatment  Patient Details  Name: Gregory Crane MRN: [id number] Date of Birth: [birth year] Referring Provider (PT): [name], MD   Encounter Date: 12/19/2020   PT End of Session - 12/19/20 0949    Visit Number 11    Number of Visits 26    Date for PT Re-Evaluation 02/08/21    Authorization Type [employer] managed;  1/10 PN 4/21    Authorization Time Period Cert 1/75/10-2/58/52    PT Start Time 0930    PT Stop Time 1014    PT Time Calculation (min) 44 min    Equipment Utilized During Treatment Gait belt   iPad; treadmill; yellow TB; blue TB; AFO   Activity Tolerance Patient tolerated treatment well    Behavior During Therapy [facility] for tasks assessed/performed           Past Medical History:  Diagnosis Date  . Allergy   . Anemia   . Bell's palsy   . Diabetes mellitus without complication (HCC)    diet controlled  . Hypertension   . Hypothyroidism   . Kidney stones   . Pseudotumor cerebri   . Stroke [facility])     Past Surgical History:  Procedure Laterality Date  . COLONOSCOPY WITH PROPOFOL N/A 03/30/2020   Procedure: COLONOSCOPY WITH PROPOFOL;  Surgeon: Lesly Rubenstein, MD;  Location: [facility] ENDOSCOPY;  Service: Endoscopy;  Laterality: N/A;  . LOOP RECORDER INSERTION N/A 01/27/2018   Procedure: LOOP RECORDER INSERTION;  Surgeon: [name], MD;  Location: [place] CV LAB;  Service: Cardiovascular;  Laterality: N/A;  . LUMBAR PUNCTURE     as child  . NO PAST SURGERIES    . TEE WITHOUT CARDIOVERSION N/A 01/07/2018   Procedure: TRANSESOPHAGEAL ECHOCARDIOGRAM (TEE);  Surgeon: Minna Merritts, MD;  Location: [facility] ORS;  Service: Cardiovascular;  Laterality: N/A;    There were no vitals filed for this visit.   Subjective Assessment - 12/19/20 0932    Subjective Patient reports he went to the  chiropractor yesterday that has decreased his pain. No falls or LOB since last session.    Pertinent History Referred by PCP for unsteadiness of gait. PMH: anemia, CKD4, IDDM, HTN on amlodipine, hydralazin, losartan, lasix; HLD< hypoTSH, neuropathy with gait disturbance on Lyrica; COVID PNA hospitalization.    Currently in Pain? No/denies                Therapeutic Exercise:     seated dynadisc: df/pf 10x each LE, inversion/eversion 10x each LE, clockwise/counterclockwise circle 10x each LE    Seated hamstring and calf stretch 2x30 sec each LE; VC for technique; pt reports LLE feeling tight     RTB around bilateral feet hip flexion 10x each LE cues for keeping widened BOS and neutral foot alignment very challenging   RTB around bilateral feet; abduction/ER 10x each LE   RTB around ankles: LAQ alternating LE's 10x each LE  Standing single leg three way hip raise 10x each LE ; heavy UE support   Standing heel toe raise 20x with heavy UE support   Seated LAQ with green ball adduction 10x    Neuro Re-ED    One LE on dynadisc the other LE on airex pad 2x30 sec each LE  CGA   airex pad NBOS 2x30 sec, CGA cues for focus on central point  Airex: NBOS  horizontal head turns 10x; cues for focus on central point airex pad: NBOS vertical head turns 10x; cues for focus on central point    Education provided throughout session via VC/TC/demonstration to promote movement at target joints and correct muscle activation with exercises.   Assessment: Patient is highly motivated throughout physical therapy session. Ankle muscle activation and righting reactions are challenging with patient tolerating progressions well with no pain. He continues to be challenged with prolonged muscle recruitment fatiguing quickly.  The pt will benefit from further skilled therapy to improve endurance, BLE strength and balance.                         PT Education - 12/19/20 0949    Education  Details exercise technique, body mechanics    Person(s) Educated Patient    Methods Explanation;Demonstration;Tactile cues;Verbal cues    Comprehension Verbalized understanding;Returned demonstration;Verbal cues required;Tactile cues required            PT Short Term Goals - 12/14/20 1450      PT SHORT TERM GOAL #1   Title Pt to demonstrate improved 5xSTS in <15sec without LOB    Baseline 20sec with 3 LOB at eval 4/21: 15.05 seconds with two near LOB    Time 4    Period Weeks    Status Partially Met    Target Date 01/11/21             PT Long Term Goals - 12/14/20 1409      PT LONG TERM GOAL #1   Title Pt to show improved FOTO score to >70.    Baseline FOTO: 59/100 at evaluation 4/21: 56.2%    Time 8    Period Weeks    Status On-going    Target Date 02/08/21      PT LONG TERM GOAL #2   Title Pt to demonstrate 5/5 ankle dorsiflexion bilat.    Baseline 4/5 bilat at eval 4/21:4+/5 left; 4/5 R pf 4+/5 df    Time 8    Period Weeks    Status Partially Met    Target Date 02/08/21      PT LONG TERM GOAL #3   Title Pt to demnonstrate SLS bilat >30sec, shod, AFO as needed    Baseline <3sec at eval 4/21: 4 seconds L 8 seconds R    Time 8    Period Weeks    Status Partially Met    Target Date 02/08/21      PT LONG TERM GOAL #4   Title Patient will increase six minute walk test distance to >1600 for progression to age norm community ambulator and improve gait ability    Baseline 4/21: 1185 ft with walking stick    Time 8    Period Weeks    Status Partially Met    Target Date 02/08/21                 Plan - 12/19/20 1000    Clinical Impression Statement Patient is highly motivated throughout physical therapy session. Ankle muscle activation and righting reactions are challenging with patient tolerating progressions well with no pain. He continues to be challenged with prolonged muscle recruitment fatiguing quickly.  The pt will benefit from further skilled  therapy to improve endurance, BLE strength and balance.    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 complexity    Rehab Potential Good    PT Frequency 2x / week    PT Duration 12 weeks    PT Treatment/Interventions Cryotherapy;Electrical Stimulation;Moist Heat;DME Instruction;Neuromuscular re-education;Therapeutic activities;Therapeutic exercise;Stair training;Gait training;Functional mobility training;Patient/family education;Orthotic Fit/Training    PT Next Visit Plan ankle stability and mobility    Consulted and Agree with Plan of Care Patient           Patient will benefit from skilled therapeutic intervention in order to improve the following deficits and impairments:  Abnormal gait,Decreased coordination,Decreased range of motion,Difficulty walking,Cardiopulmonary status limiting activity,Decreased activity tolerance,Decreased balance,Decreased knowledge of precautions,Decreased knowledge of use of DME,Decreased mobility,Decreased strength,Impaired sensation,Postural dysfunction  Visit Diagnosis: Muscle weakness (generalized)  Unsteadiness on feet  Other abnormalities of gait and mobility     Problem List Patient Active Problem List   Diagnosis Date Noted  . History of colon polyps 10/15/2020  . Postoperative hemorrhage involving digestive system following digestive system procedure 09/19/2020  . Acute renal failure superimposed on stage 4 chronic kidney disease ([place]) 09/17/2020  . Acute cholecystitis without calculus 09/09/2020  . Type 2 diabetes mellitus, with long-term current use of insulin ([place]) 09/09/2020  . Cryptogenic stroke ([place]) 07/13/2020  . History of loop recorder 07/13/2020  . Weakness 06/18/2020  . History of 2019 novel  coronavirus disease (COVID-19) 05/20/2020  . CKD (chronic kidney disease) stage 4, GFR 15-29 ml/min (HCC) 04/04/2020  . Pneumonia due to COVID-19 virus 04/03/2020  . AKI (acute kidney injury) ([place]) 04/03/2020  . Elevated troponin 04/03/2020  . Acquired trigger finger 06/08/2019  . Lymphedema 06/08/2019  . Anemia 04/17/2019  . Swelling of left lower extremity 01/10/2019  . Facial droop 04/09/2018  . Daytime somnolence 03/30/2018  . Carotid stenosis 02/03/2018  . Intracranial vascular stenosis 09/12/2017  . Cough 01/20/2017  . Bell's palsy 11/10/2016  . History of CVA (cerebrovascular accident) 11/10/2016  . 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 History of nephrolithiasis 10/27/2016  . TIA (transient ischemic attack) 10/20/2016  . Near syncope 06/23/2016  . 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 Neuropathy 08/06/2015  . Health care maintenance 08/06/2015  . Essential hypertension 08/06/2015  . Heme positive stool 10/10/2013  . Hypothyroidism 10/10/2013  . Microalbuminuria 10/10/2013  . Hyperlipidemia 10/10/2013  . B12 deficiency 10/10/2013  . Diabetes ([place]) 07/04/2013  . Environmental allergies 07/04/2013   Janna Arch, PT, DPT   12/19/2020, 10:14 AM  [place] MAIN [facility] SERVICES [address] [place], [place], [address] Phone: [phone]   Fax:  [phone]  Name: Gregory Crane MRN: [id number] Date of Birth: 1971/04/13

## 2020-12-21 ENCOUNTER — Other Ambulatory Visit: Payer: Self-pay

## 2020-12-21 ENCOUNTER — Ambulatory Visit: Payer: No Typology Code available for payment source

## 2020-12-21 DIAGNOSIS — R269 Unspecified abnormalities of gait and mobility: Secondary | ICD-10-CM

## 2020-12-21 DIAGNOSIS — M6281 Muscle weakness (generalized): Secondary | ICD-10-CM | POA: Diagnosis not present

## 2020-12-21 DIAGNOSIS — R262 Difficulty in walking, not elsewhere classified: Secondary | ICD-10-CM

## 2020-12-21 DIAGNOSIS — R278 Other lack of coordination: Secondary | ICD-10-CM

## 2020-12-21 NOTE — Therapy (Signed)
[place] MAIN [name] SERVICES [address] [place], [place], [address] Phone: [phone]   Fax:  [phone]  Physical Therapy Treatment  Patient Details  Name: Gregory Crane MRN: [id number] Date of Birth: 1971/07/31 Referring Provider (PT): [name], MD   Encounter Date: 12/21/2020   PT End of Session - 12/21/20 2231    Visit Number 12    Number of Visits 26    Date for PT Re-Evaluation 02/08/21    Authorization Type [employer] managed;  1/10 PN 4/21    Authorization Time Period Cert 3/73/42-8/76/81    PT Start Time 0853    PT Stop Time 0929    PT Time Calculation (min) 36 min    Equipment Utilized During Treatment Gait belt   iPad; treadmill; yellow TB; blue TB; AFO   Activity Tolerance Patient tolerated treatment well    Behavior During Therapy [facility] for tasks assessed/performed           Past Medical History:  Diagnosis Date  . Allergy   . Anemia   . Bell's palsy   . Diabetes mellitus without complication (HCC)    diet controlled  . Hypertension   . Hypothyroidism   . Kidney stones   . Pseudotumor cerebri   . Stroke [facility])     Past Surgical History:  Procedure Laterality Date  . COLONOSCOPY WITH PROPOFOL N/A 03/30/2020   Procedure: COLONOSCOPY WITH PROPOFOL;  Surgeon: Lesly Rubenstein, MD;  Location: [facility] ENDOSCOPY;  Service: Endoscopy;  Laterality: N/A;  . LOOP RECORDER INSERTION N/A 01/27/2018   Procedure: LOOP RECORDER INSERTION;  Surgeon: [name], MD;  Location: [place] CV LAB;  Service: Cardiovascular;  Laterality: N/A;  . LUMBAR PUNCTURE     as child  . NO PAST SURGERIES    . TEE WITHOUT CARDIOVERSION N/A 01/07/2018   Procedure: TRANSESOPHAGEAL ECHOCARDIOGRAM (TEE);  Surgeon: Minna Merritts, MD;  Location: [facility] ORS;  Service: Cardiovascular;  Laterality: N/A;    There were no vitals filed for this visit.   Subjective Assessment - 12/21/20 0857    Subjective Patient reports he went to the  chiropractor yesterday that has decreased his pain. No falls or LOB since last session.    Pertinent History Referred by PCP for unsteadiness of gait. PMH: anemia, CKD4, IDDM, HTN on amlodipine, hydralazin, losartan, lasix; HLD< hypoTSH, neuropathy with gait disturbance on Lyrica; COVID PNA hospitalization.    Currently in Pain? No/denies             Therapeutic Exercise:  Reviewed seated resistive ankle DF- using GTB- Patient able to demo correct technique  Standing single leg calf raise x 5 reps (difficult) switched to BLE same time and patient able to perform 10 reps  Step up/over orange hurdle at bar with 1UE hovering to touch as needed x 10 reps  Forward/backward over orange- Mild difficulty with unsteadiness with retro step Step tap onto colored cone  With 1UE support- No difficulty with proprioception or coordination of movement however unstable with SLS to dynamically tap the called out cone.     seated dynadisc: df/pf 10x each LE, inversion/eversion 10x each LE, clockwise/counterclockwise circle 10x each LE    Seated hamstring and calf stretch 2x30 sec each LE; VC for technique; pt reports LLE feeling tight         Neuro Re-ED   Stand in corner with varying feet position: then EO then EC x 20 sec each, followed by EO with head  turn/nod and EC with Head turn/nod- x 5 each- Increased A/P sway with any activity with eyes closed.   Education provided throughout session via VC/TC/demonstration to promote movement at target joints and correct muscle activation with exercises.   Clinical Impression:  Patient presents with good recall of some of the activities performed last session and less overall cueing. He was able to progress with balance activities including challenging the sensory component of balance- most challenged with eyes closed activities.The pt will benefit from further skilled therapy to improve endurance, BLE strength and balance.                                       PT Education - 12/21/20 2227    Education Details specific exercise technique    Person(s) Educated Patient    Methods Explanation;Demonstration;Tactile cues;Verbal cues;Handout    Comprehension Verbalized understanding;Returned demonstration;Verbal cues required;Tactile cues required;Need further instruction            PT Short Term Goals - 12/14/20 1450      PT SHORT TERM GOAL #1   Title Pt to demonstrate improved 5xSTS in <15sec without LOB    Baseline 20sec with 3 LOB at eval 4/21: 15.05 seconds with two near LOB    Time 4    Period Weeks    Status Partially Met    Target Date 01/11/21             PT Long Term Goals - 12/14/20 1409      PT LONG TERM GOAL #1   Title Pt to show improved FOTO score to >70.    Baseline FOTO: 59/100 at evaluation 4/21: 56.2%    Time 8    Period Weeks    Status On-going    Target Date 02/08/21      PT LONG TERM GOAL #2   Title Pt to demonstrate 5/5 ankle dorsiflexion bilat.    Baseline 4/5 bilat at eval 4/21:4+/5 left; 4/5 R pf 4+/5 df    Time 8    Period Weeks    Status Partially Met    Target Date 02/08/21      PT LONG TERM GOAL #3   Title Pt to demnonstrate SLS bilat >30sec, shod, AFO as needed    Baseline <3sec at eval 4/21: 4 seconds L 8 seconds R    Time 8    Period Weeks    Status Partially Met    Target Date 02/08/21      PT LONG TERM GOAL #4   Title Patient will increase six minute walk test distance to >1600 for progression to age norm community ambulator and improve gait ability    Baseline 4/21: 1185 ft with walking stick    Time 8    Period Weeks    Status Partially Met    Target Date 02/08/21                 Plan - 12/21/20 2232    Clinical Impression Statement Patient presents with good recall of some of the activities performed last session and less overall cueing. He was able to progress with balance activities including challenging  the sensory component of balance- most challenged with eyes closed activities.The pt will benefit from further skilled therapy to improve endurance, BLE strength and balance.    Personal Factors and Comorbidities Age;Comorbidity 1;Fitness    Examination-Activity Limitations Locomotion Level;Transfers;Carry;Toileting;Stand;Sit  Examination-Participation Restrictions Cleaning;Meal Prep;Occupation    Stability/Clinical Decision Making Evolving/Moderate complexity    Rehab Potential Good    PT Frequency 2x / week    PT Duration 12 weeks    PT Treatment/Interventions Cryotherapy;Electrical Stimulation;Moist Heat;DME Instruction;Neuromuscular re-education;Therapeutic activities;Therapeutic exercise;Stair training;Gait training;Functional mobility training;Patient/family education;Orthotic Fit/Training    PT Next Visit Plan ankle stability and mobility    PT Home Exercise Plan no changes this session    Consulted and Agree with Plan of Care Patient           Patient will benefit from skilled therapeutic intervention in order to improve the following deficits and impairments:  Abnormal gait,Decreased coordination,Decreased range of motion,Difficulty walking,Cardiopulmonary status limiting activity,Decreased activity tolerance,Decreased balance,Decreased knowledge of precautions,Decreased knowledge of use of DME,Decreased mobility,Decreased strength,Impaired sensation,Postural dysfunction  Visit Diagnosis: Abnormality of gait and mobility  Other lack of coordination  Difficulty in walking, not elsewhere classified  Muscle weakness (generalized)     Problem List Patient Active Problem List   Diagnosis Date Noted  . History of colon polyps 10/15/2020  . Postoperative hemorrhage involving digestive system following digestive system procedure 09/19/2020  . Acute renal failure superimposed on stage 4 chronic kidney disease ([place]) 09/17/2020  . Acute cholecystitis without calculus 09/09/2020   . Type 2 diabetes mellitus, with long-term current use of insulin ([place]) 09/09/2020  . Cryptogenic stroke ([place]) 07/13/2020  . History of loop recorder 07/13/2020  . Weakness 06/18/2020  . History of 2019 novel coronavirus disease (COVID-19) 05/20/2020  . CKD (chronic kidney disease) stage 4, GFR 15-29 ml/min (HCC) 04/04/2020  . Pneumonia due to COVID-19 virus 04/03/2020  . AKI (acute kidney injury) ([place]) 04/03/2020  . Elevated troponin 04/03/2020  . Acquired trigger finger 06/08/2019  . Lymphedema 06/08/2019  . Anemia 04/17/2019  . Swelling of left lower extremity 01/10/2019  . Facial droop 04/09/2018  . Daytime somnolence 03/30/2018  . Carotid stenosis 02/03/2018  . Intracranial vascular stenosis 09/12/2017  . Cough 01/20/2017  . Bell's palsy 11/10/2016  . History of CVA (cerebrovascular accident) 11/10/2016  . 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 History of nephrolithiasis 10/27/2016  . TIA (transient ischemic attack) 10/20/2016  . Near syncope 06/23/2016  . 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 Neuropathy 08/06/2015  . Health care maintenance 08/06/2015  . Essential hypertension 08/06/2015  . Heme positive stool 10/10/2013  . Hypothyroidism 10/10/2013  . Microalbuminuria 10/10/2013  . Hyperlipidemia 10/10/2013  . B12 deficiency 10/10/2013  . Diabetes ([place]) 07/04/2013  . Environmental allergies 07/04/2013    Lewis Moccasin, PT 12/22/2020, 7:57 AM  [place] MAIN [facility] SERVICES [address] [place], [place], [address] Phone: [phone]   Fax:  [phone]  Name: Gregory Crane MRN: [id number] Date of Birth: [birth year]

## 2020-12-22 ENCOUNTER — Ambulatory Visit: Payer: No Typology Code available for payment source | Admitting: Pharmacist

## 2020-12-22 DIAGNOSIS — I10 Essential (primary) hypertension: Secondary | ICD-10-CM

## 2020-12-22 DIAGNOSIS — Z8673 Personal history of transient ischemic attack (TIA), and cerebral infarction without residual deficits: Secondary | ICD-10-CM

## 2020-12-22 DIAGNOSIS — N184 Chronic kidney disease, stage 4 (severe): Secondary | ICD-10-CM

## 2020-12-22 DIAGNOSIS — E11319 Type 2 diabetes mellitus with unspecified diabetic retinopathy without macular edema: Secondary | ICD-10-CM

## 2020-12-22 DIAGNOSIS — Z794 Long term (current) use of insulin: Secondary | ICD-10-CM

## 2020-12-22 NOTE — Patient Instructions (Signed)
Visit Information  PATIENT GOALS: Goals Addressed              This Visit's Progress     Patient Stated   .  Medication Monitoring (pt-stated)        Patient Goals/Self-Care Activities . Over the next 90 days, patient will:  - take medications as prescribed check glucose at least 3 times daily using CGM, document, and provide at future appointments check blood pressure daily, document, and provide at future appointments        Patient verbalizes understanding of instructions provided today and agrees to view in [place].   Plan: Telephone follow up appointment with care management team member scheduled for:  ~ 10 weeks  Catie [name], PharmD, [place], [place] Clinical Pharmacist [employer] at [employer] [phone]

## 2020-12-22 NOTE — Chronic Care Management (AMB) (Signed)
Care Management   Pharmacy Note  12/22/2020 Name: Gregory Crane MRN: [id number] DOB: 1970/12/29  Subjective: Gregory Crane is a 50 y.o. year old male who is a primary care patient of [name], MD. The Care Management team was consulted for assistance with care management and care coordination needs.    Engaged with patient by telephone for follow up visit in response to provider referral for pharmacy case management and/or care coordination services.   The patient was given information about Care Management services today including:  1. Care Management services includes personalized support from designated clinical staff supervised by the patient's primary care provider, including individualized plan of care and coordination with other care providers. 2. 24/7 contact phone numbers for assistance for urgent and routine care needs. 3. The patient may stop case management services at any time by phone call to the office staff.  Patient agreed to services and consent obtained.  Assessment:  Review of patient status, including review of consultants reports, laboratory and other test data, was performed as part of comprehensive evaluation and provision of chronic care management services.   SDOH (Social Determinants of Health) assessments and interventions performed:  SDOH Interventions   Flowsheet Row Most Recent Value  SDOH Interventions   Financial Strain Interventions Other (Comment)       Objective:  Lab Results  Component Value Date   CREATININE 3.52 (H) 08/15/2020   CREATININE 2.45 (H) 05/16/2020   CREATININE 2.41 (H) 04/24/2020    Lab Results  Component Value Date   HGBA1C 6.7 (H) 08/15/2020       Component Value Date/Time   CHOL 101 08/15/2020 1221   CHOL 109 03/28/2020 1202   TRIG 157.0 (H) 08/15/2020 1221   HDL 25.10 (L) 08/15/2020 1221   HDL 31 (L) 03/28/2020 1202   CHOLHDL 4 08/15/2020 1221   VLDL 31.4 08/15/2020 1221   LDLCALC 45 08/15/2020 1221    LDLCALC 60 03/28/2020 1202    Clinical ASCVD: No  The ASCVD Risk score Mikey Bussing DC Jr., et al., 2013) failed to calculate for the following reasons:   The patient has a prior MI or stroke diagnosis     BP Readings from Last 3 Encounters:  12/01/20 (!) 182/77  10/31/20 (!) 162/77  10/24/20 (!) 166/66    Care Plan  No Known Allergies  Medications Reviewed Today    Reviewed by De Hollingshead, RPH-CPP (Pharmacist) on 12/22/20 at 1042  Med List Status: <None>  Medication Order Taking? Sig Documenting Provider Last Dose Status Informant  Accu-Chek FastClix Lancets MISC [id number] Yes USE TO CHECK BLOOD SUGAR THREE TIMES DAILY AS DIRECTED Leone Haven, MD Taking Active   amLODipine (NORVASC) 10 MG tablet [id number] Yes TAKE 1 TABLET(10 MG) BY MOUTH DAILY [name], MD Taking Active   ascorbic acid (VITAMIN C) 500 MG tablet [id number] Yes Take 1 tablet (500 mg total) by mouth daily. [name], MD Taking Active   aspirin (ASPIRIN LOW DOSE) 81 MG EC tablet [id number] Yes TAKE 1 TABLET(81 MG) BY MOUTH DAILY [name], MD Taking Active   atorvastatin (LIPITOR) 80 MG tablet [id number] Yes TAKE 1 TABLET(80 MG) BY MOUTH DAILY [name], MD Taking Active   cholecalciferol (VITAMIN D3) 25 MCG (1000 UNIT) tablet [id number] Yes Take 1,000 Units by mouth daily. [provider] Taking Active   clopidogrel (PLAVIX) 75 MG tablet [id number] Yes TAKE 1 TABLET(75 MG) BY MOUTH DAILY [name], MD Taking Active   Continuous Blood Gluc Sensor (  FREESTYLE LIBRE 2 SENSOR) MISC [id number] No Use to check glucose at least TID  Patient not taking: Reported on 12/22/2020   [name], MD Not Taking Active   cyanocobalamin (,VITAMIN B-12,) 1000 MCG/ML injection [id number] Yes INJECT 1 ML INTO THE SKIN ONCE A WEEK FOR 3 WEEKS THEN 1ML EVERY 30 DAYS [name], MD Taking Active   DULoxetine (CYMBALTA) 20 MG capsule [id number] Yes TAKE 1 CAPSULE(20 MG) BY MOUTH DAILY [name], MD Taking Active   furosemide (LASIX) 20 MG tablet [id number] Yes TAKE 3 TABLETS(60 MG) BY MOUTH TWICE DAILY [name], MD Taking Active            Med Note De Hollingshead   Fri Oct 06, 2020  3:38 PM) 60 units QAM  glucose blood (FREESTYLE LITE) test strip [id number] Yes Check blood sugars twice a day (Dx. 250.02) [name], MD Taking Active Spouse/Significant Other  HUMALOG 100 UNIT/ML injection [id number] Yes INJECT AS DIRECTED PER SLIDING SCALE 70-200: 0 UNITS, 201-250: 1 UNIT, 251-300: 2 UNITS, 301-350: 3 UNITS,>350: 4 UNITS [name], [name], MD Taking Active   hydrALAZINE (APRESOLINE) 50 MG tablet [id number] Yes TAKE 1 TABLET(50 MG) BY MOUTH THREE TIMES DAILY [name], MD Taking Active   insulin glargine (LANTUS) 100 UNIT/ML injection [id number] Yes ADMINISTER 5 UNITS UNDER THE SKIN EVERY NIGHT [name], MD Taking Active            Med Note [name], [name]   Fri Dec 22, 2020 10:42 AM) Taking 15 units daily  Insulin Syringe-Needle U-100 (INSULIN SYRINGE .5CC/30GX5/16") 30G X 5/16" 0.5 ML MISC [id number] No USE AS DIRECTED WITH INSULIN  Patient not taking: Reported on 12/22/2020   [name], MD Not Taking Active   levothyroxine (SYNTHROID) 150 MCG tablet [id number] Yes Take 1 tablet (150 mcg total) by mouth daily before breakfast. [name], MD Taking Active   losartan (COZAAR) 100 MG tablet [id number] Yes TAKE 1 TABLET BY MOUTH DAILY [name], MD Taking Active   metolazone (ZAROXOLYN) 2.5 MG tablet [id number] Yes Take 2.5 mg by mouth. Twice weekly [provider] Taking Active   potassium chloride SA (KLOR-CON) 20 MEQ tablet [id number] Yes Take 20 mEq by mouth. [provider] Taking Active   pregabalin (LYRICA) 100 MG capsule [id number] Yes TAKE 1 CAPSULE(100 MG) BY MOUTH THREE TIMES DAILY [name], MD Taking Active   Semaglutide, 1 MG/DOSE, (OZEMPIC, 1 MG/DOSE,) 4 MG/3ML SOPN [id number] Yes Inject 0.75 mLs (1 mg  total) into the skin once a week. [name], MD Taking Active   Syringe/Needle, Disp, (SYRINGE 3CC/25GX1") 25G X 1" 3 ML MISC [id number] Yes Use as directed to administer b12 injections. [name], MD Taking Active   zinc sulfate 220 (50 Zn) MG capsule [id number] Yes Take 1 capsule (220 mg total) by mouth daily. [name], MD Taking Active           Patient Active Problem List   Diagnosis Date Noted  . History of colon polyps 10/15/2020  . Postoperative hemorrhage involving digestive system following digestive system procedure 09/19/2020  . Acute renal failure superimposed on stage 4 chronic kidney disease ([place]) 09/17/2020  . Acute cholecystitis without calculus 09/09/2020  . Type 2 diabetes mellitus, with long-term current use of insulin ([place]) 09/09/2020  . Cryptogenic stroke ([place]) 07/13/2020  . History of loop recorder 07/13/2020  . Weakness 06/18/2020  . History of 2019 novel coronavirus disease (COVID-19) 05/20/2020  . CKD (chronic kidney disease) stage 4, GFR  15-29 ml/min ([place]) 04/04/2020  . Pneumonia due to COVID-19 virus 04/03/2020  . AKI (acute kidney injury) ([place]) 04/03/2020  . Elevated troponin 04/03/2020  . Acquired trigger finger 06/08/2019  . Lymphedema 06/08/2019  . Anemia 04/17/2019  . Swelling of left lower extremity 01/10/2019  . Facial droop 04/09/2018  . Daytime somnolence 03/30/2018  . Carotid stenosis 02/03/2018  . Intracranial vascular stenosis 09/12/2017  . Cough 01/20/2017  . Bell's palsy 11/10/2016  . History of CVA (cerebrovascular accident) 11/10/2016  . 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 History of nephrolithiasis 10/27/2016  . TIA (transient ischemic attack) 10/20/2016  . Near syncope 06/23/2016  . 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 Neuropathy 08/06/2015  . Health care maintenance 08/06/2015  . Essential hypertension 08/06/2015  . Heme positive stool 10/10/2013  . Hypothyroidism 10/10/2013   . Microalbuminuria 10/10/2013  . Hyperlipidemia 10/10/2013  . B12 deficiency 10/10/2013  . Diabetes ([place]) 07/04/2013  . Environmental allergies 07/04/2013    Conditions to be addressed/monitored: HTN, HLD, DMII and CKD  Care Plan : Medication Management  Updates made by De Hollingshead, RPH-CPP since 12/22/2020 12:00 AM    Problem: Diabetes, CKD, chronic pain, HTN     Long-Range Goal: Disease Progression Prevention   Start Date: 10/06/2020  This Visit's Progress: On track  Recent Progress: On track  Priority: High  Note:   Current Barriers:  . Unable to independently monitor therapeutic efficacy . Suboptimal therapeutic regimen for diabetes, HTN  Pharmacist Clinical Goal(s):  Marland Kitchen Over the next 90 days, patient will achieve adherence to monitoring guidelines and medication adherence to achieve therapeutic efficacy through collaboration with PharmD and provider.   Interventions: . 1:1 collaboration with [name], MD regarding development and update of comprehensive plan of care as evidenced by provider attestation and co-signature . Inter-disciplinary care team collaboration (see longitudinal plan of care) . Comprehensive medication review performed; medication list updated in electronic medical record  Health Maintenance: . Due for diabetic eye exam. Discussed with patient. He notes he continues to follow with his retina specialist, but has not had a formal diabetic eye exam in "a while". Encouraged to schedule this and have the results sent to our office.   Diabetes: . Controlled; current treatment: Ozempic 1 mg weekly - tends to forget lately, Lantus 15 units QPM, Humalog 4-8 units with meals o Renal fx limits use of metformin, SGLT2 . Current glucose readings: fasting: 130-160s; post prandial 130-140 . Reports he started using Libre 2 CGM, but had issues with recurrent alarms for low blood sugars. He notes that someone (he can't remember if an [employer] rep or his local  pharmacy), told him there was a system issue with Libre 2 sensors beeping inappropriately for lows. He stopped using the sensor. Notes he's OK with continuing finger sticks.  . Denies any concerns with low blood sugars recently.  . Discussed strategies for adherence to Ozempic, such as setting alarms or writing on his calendar. . Recommend to continue current regimen at this time.  Hypertension complicated by CKD: . Uncontrolled; current treatment: amlodipine 10 mg QAM, losartan 100 mg QAM, hydralazine 50 mg TID, furosemide 60 mg QAM, metolazone 2.5 mg two days weekly . Home BP readings: ~140-145/70-80s. He is unsure what his nephrologist's goal for BP is.  . Praised for continued home BP monitoring.   Hyperlipidemia and ASCVD risk reduction: . Controlled; current treatment: atorvastatin 80 mg daily . Antiplatelet regimen: aspirin 81 mg daily, clopidogrel 75 mg daily  .  Continue current regimen at this time, along with collaboration with cardiology.   Depression/Anxiety with neuropathy . Controlled; current treatment: duloxetine 20 mg daily, pregabalin 100 mg TID  . Complicated by wife's recent passing. Notes he worked with [facility] for a while. Denies need for therapy support at this time. Encouraged to let us know if a future need for mental health support . Recommend to continue current regimen at this time.  Hypothyroidism: . Controlled per last lab work; current regimen: levothyroxine 150 mcg daily . Continue current regimen at this time.  Supplements: Marland Kitchen Vitamin D, Vitamin C, zinc, vitamin B12 injections, ferrous sulfate 325 mg dialy . Requests refill of Vitamin B12 injections. Will collaborate w PCP on this.   Patient Goals/Self-Care Activities . Over the next 90 days, patient will:  - take medications as prescribed check glucose at least 3 times daily using CGM, document, and provide at future appointments check blood pressure daily, document, and provide at future  appointments  Follow Up Plan: Telephone follow up appointment with care management team member scheduled for: ~ 10 weeks      Medication Assistance:  None required.  Patient affirms current coverage meets needs.  Follow Up:  Patient agrees to Care Plan and Follow-up.  Plan: Telephone follow up appointment with care management team member scheduled for:  ~ 10 weeks  Catie [name], PharmD, [place], [place] Clinical Pharmacist [employer] at [employer] [phone]

## 2020-12-26 ENCOUNTER — Other Ambulatory Visit: Payer: Self-pay

## 2020-12-26 ENCOUNTER — Ambulatory Visit: Payer: No Typology Code available for payment source | Attending: Internal Medicine

## 2020-12-26 DIAGNOSIS — R262 Difficulty in walking, not elsewhere classified: Secondary | ICD-10-CM | POA: Diagnosis present

## 2020-12-26 DIAGNOSIS — R269 Unspecified abnormalities of gait and mobility: Secondary | ICD-10-CM

## 2020-12-26 DIAGNOSIS — R278 Other lack of coordination: Secondary | ICD-10-CM | POA: Insufficient documentation

## 2020-12-26 DIAGNOSIS — R2681 Unsteadiness on feet: Secondary | ICD-10-CM | POA: Diagnosis present

## 2020-12-26 DIAGNOSIS — M6281 Muscle weakness (generalized): Secondary | ICD-10-CM | POA: Insufficient documentation

## 2020-12-26 DIAGNOSIS — R2689 Other abnormalities of gait and mobility: Secondary | ICD-10-CM | POA: Diagnosis present

## 2020-12-26 NOTE — Therapy (Signed)
[place] MAIN [facility] SERVICES [address] [place], [place], [address] Phone: [phone]   Fax:  [phone]  Physical Therapy Treatment  Patient Details  Name: Gregory Crane MRN: [id number] Date of Birth: 22-Oct-1970 Referring Provider (PT): [name], MD   Encounter Date: 12/26/2020   PT End of Session - 12/26/20 0859    Visit Number 13    Number of Visits 26    Date for PT Re-Evaluation 02/08/21    Authorization Type [employer] managed;  1/10 PN 4/21    Authorization Time Period Cert 1/60/10-9/32/35    PT Start Time 0850    PT Stop Time 0929    PT Time Calculation (min) 39 min    Equipment Utilized During Treatment Gait belt   iPad; treadmill; yellow TB; blue TB; AFO   Activity Tolerance Patient tolerated treatment well    Behavior During Therapy [facility] for tasks assessed/performed           Past Medical History:  Diagnosis Date  . Allergy   . Anemia   . Bell's palsy   . Diabetes mellitus without complication (HCC)    diet controlled  . Hypertension   . Hypothyroidism   . Kidney stones   . Pseudotumor cerebri   . Stroke [facility])     Past Surgical History:  Procedure Laterality Date  . COLONOSCOPY WITH PROPOFOL N/A 03/30/2020   Procedure: COLONOSCOPY WITH PROPOFOL;  Surgeon: Lesly Rubenstein, MD;  Location: [facility] ENDOSCOPY;  Service: Endoscopy;  Laterality: N/A;  . LOOP RECORDER INSERTION N/A 01/27/2018   Procedure: LOOP RECORDER INSERTION;  Surgeon: [name], MD;  Location: [place] CV LAB;  Service: Cardiovascular;  Laterality: N/A;  . LUMBAR PUNCTURE     as child  . NO PAST SURGERIES    . TEE WITHOUT CARDIOVERSION N/A 01/07/2018   Procedure: TRANSESOPHAGEAL ECHOCARDIOGRAM (TEE);  Surgeon: Minna Merritts, MD;  Location: [facility] ORS;  Service: Cardiovascular;  Laterality: N/A;    There were no vitals filed for this visit.   Subjective Assessment - 12/26/20 0854    Subjective Patient reports that he has been  pretty busy since his last visit and denies any pain today.    Pertinent History Referred by PCP for unsteadiness of gait. PMH: anemia, CKD4, IDDM, HTN on amlodipine, hydralazin, losartan, lasix; HLD< hypoTSH, neuropathy with gait disturbance on Lyrica; COVID PNA hospitalization.    Currently in Pain? No/denies         Therapeutic Exercise:   Nustep L3 x 6 min LE only  Seated ham curl with 7.5# 2 sets of 10 reps- Patient reports as "medium"  Seated resistive ankle DF- using GTB- 2 sets of 12 reps Seated resistive ankle/foot abd using GTB- 2 sets of 12 reps            Neuro Re-ED   Rocker board - A/P - static stand without UE x 1 min followed by active movement ant/pos without UE support x 25 reps followed by 30 sec static stand with eyes closed  Rockerboard-Lateral position without UE support x 1 min followed by active movment ant/post without UE support x25 reps followed by another 30 sec with eyes closed. Increased unsteadiness in lateral position board vs. Ant/post  Education provided throughout session via VC/TC/demonstration to promote movement at target joints and correct muscle activation with exercises.   Clinical Impression:  Patient continues to progress well - able to add more resistive LE strengthening and rockerboard for dynamic  standing balance.He continues to present with challenged balance with any eyes closed position. The pt will benefit from further skilled therapy to improve endurance, BLE strength and balance.                                 PT Education - 12/27/20 1403    Education Details specific exercise cues for balance    Person(s) Educated Patient    Methods Explanation;Demonstration;Tactile cues;Verbal cues    Comprehension Verbalized understanding;Returned demonstration;Verbal cues required            PT Short Term Goals - 12/14/20 1450      PT SHORT TERM GOAL #1   Title Pt to demonstrate improved 5xSTS in <15sec  without LOB    Baseline 20sec with 3 LOB at eval 4/21: 15.05 seconds with two near LOB    Time 4    Period Weeks    Status Partially Met    Target Date 01/11/21             PT Long Term Goals - 12/14/20 1409      PT LONG TERM GOAL #1   Title Pt to show improved FOTO score to >70.    Baseline FOTO: 59/100 at evaluation 4/21: 56.2%    Time 8    Period Weeks    Status On-going    Target Date 02/08/21      PT LONG TERM GOAL #2   Title Pt to demonstrate 5/5 ankle dorsiflexion bilat.    Baseline 4/5 bilat at eval 4/21:4+/5 left; 4/5 R pf 4+/5 df    Time 8    Period Weeks    Status Partially Met    Target Date 02/08/21      PT LONG TERM GOAL #3   Title Pt to demnonstrate SLS bilat >30sec, shod, AFO as needed    Baseline <3sec at eval 4/21: 4 seconds L 8 seconds R    Time 8    Period Weeks    Status Partially Met    Target Date 02/08/21      PT LONG TERM GOAL #4   Title Patient will increase six minute walk test distance to >1600 for progression to age norm community ambulator and improve gait ability    Baseline 4/21: 1185 ft with walking stick    Time 8    Period Weeks    Status Partially Met    Target Date 02/08/21                 Plan - 12/26/20 1405    Clinical Impression Statement Patient continues to progress well - able to add more resistive LE strengthening and rockerboard for dynamic standing balance.He continues to present with challenged balance with any eyes closed position. The pt will benefit from further skilled therapy to improve endurance, BLE strength and balance.    Personal Factors and Comorbidities Age;Comorbidity 1;Fitness    Examination-Activity Limitations Locomotion Level;Transfers;Carry;Toileting;Stand;Sit    Examination-Participation Restrictions Cleaning;Meal Prep;Occupation    Stability/Clinical Decision Making Evolving/Moderate complexity    Rehab Potential Good    PT Frequency 2x / week    PT Duration 12 weeks    PT  Treatment/Interventions Cryotherapy;Electrical Stimulation;Moist Heat;DME Instruction;Neuromuscular re-education;Therapeutic activities;Therapeutic exercise;Stair training;Gait training;Functional mobility training;Patient/family education;Orthotic Fit/Training    PT Next Visit Plan ankle stability and mobility    PT Home Exercise Plan no changes this session    Consulted and Agree  with Plan of Care Patient           Patient will benefit from skilled therapeutic intervention in order to improve the following deficits and impairments:  Abnormal gait,Decreased coordination,Decreased range of motion,Difficulty walking,Cardiopulmonary status limiting activity,Decreased activity tolerance,Decreased balance,Decreased knowledge of precautions,Decreased knowledge of use of DME,Decreased mobility,Decreased strength,Impaired sensation,Postural dysfunction  Visit Diagnosis: Abnormality of gait and mobility  Difficulty in walking, not elsewhere classified  Muscle weakness (generalized)  Other lack of coordination     Problem List Patient Active Problem List   Diagnosis Date Noted  . History of colon polyps 10/15/2020  . Postoperative hemorrhage involving digestive system following digestive system procedure 09/19/2020  . Acute renal failure superimposed on stage 4 chronic kidney disease ([place]) 09/17/2020  . Acute cholecystitis without calculus 09/09/2020  . Type 2 diabetes mellitus, with long-term current use of insulin ([place]) 09/09/2020  . Cryptogenic stroke ([place]) 07/13/2020  . History of loop recorder 07/13/2020  . Weakness 06/18/2020  . History of 2019 novel coronavirus disease (COVID-19) 05/20/2020  . CKD (chronic kidney disease) stage 4, GFR 15-29 ml/min (HCC) 04/04/2020  . Pneumonia due to COVID-19 virus 04/03/2020  . AKI (acute kidney injury) ([place]) 04/03/2020  . Elevated troponin 04/03/2020  . Acquired trigger finger 06/08/2019  . Lymphedema 06/08/2019  . Anemia 04/17/2019  .  Swelling of left lower extremity 01/10/2019  . Facial droop 04/09/2018  . Daytime somnolence 03/30/2018  . Carotid stenosis 02/03/2018  . Intracranial vascular stenosis 09/12/2017  . Cough 01/20/2017  . Bell's palsy 11/10/2016  . History of CVA (cerebrovascular accident) 11/10/2016  . 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 History of nephrolithiasis 10/27/2016  . TIA (transient ischemic attack) 10/20/2016  . Near syncope 06/23/2016  . 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 Neuropathy 08/06/2015  . Health care maintenance 08/06/2015  . Essential hypertension 08/06/2015  . Heme positive stool 10/10/2013  . Hypothyroidism 10/10/2013  . Microalbuminuria 10/10/2013  . Hyperlipidemia 10/10/2013  . B12 deficiency 10/10/2013  . Diabetes ([place]) 07/04/2013  . Environmental allergies 07/04/2013    Lewis Moccasin, PT 12/27/2020, 2:13 PM  [place] MAIN [facility] SERVICES [address] [place], [place], [address] Phone: [phone]   Fax:  [phone]  Name: Gregory Crane MRN: [id number] Date of Birth: 11/11/1970

## 2020-12-27 LAB — CUP PACEART REMOTE DEVICE CHECK
Date Time Interrogation Session: 20220430031430
Implantable Pulse Generator Implant Date: 20190604

## 2020-12-28 ENCOUNTER — Ambulatory Visit: Payer: No Typology Code available for payment source

## 2020-12-28 ENCOUNTER — Other Ambulatory Visit: Payer: Self-pay

## 2021-01-01 ENCOUNTER — Ambulatory Visit (INDEPENDENT_AMBULATORY_CARE_PROVIDER_SITE_OTHER): Payer: Managed Care, Other (non HMO)

## 2021-01-01 DIAGNOSIS — I639 Cerebral infarction, unspecified: Secondary | ICD-10-CM

## 2021-01-02 ENCOUNTER — Ambulatory Visit: Payer: No Typology Code available for payment source

## 2021-01-02 ENCOUNTER — Other Ambulatory Visit: Payer: Self-pay

## 2021-01-02 DIAGNOSIS — R278 Other lack of coordination: Secondary | ICD-10-CM

## 2021-01-02 DIAGNOSIS — R269 Unspecified abnormalities of gait and mobility: Secondary | ICD-10-CM

## 2021-01-02 DIAGNOSIS — R262 Difficulty in walking, not elsewhere classified: Secondary | ICD-10-CM

## 2021-01-02 DIAGNOSIS — M6281 Muscle weakness (generalized): Secondary | ICD-10-CM

## 2021-01-02 NOTE — Therapy (Signed)
[place] MAIN [facility] SERVICES [address] [place], [place], [address] Phone: [phone]   Fax:  [phone]  Physical Therapy Treatment  Patient Details  Name: Gregory Crane MRN: [id number] Date of Birth: 11-Nov-1970 Referring Provider (PT): [name], MD   Encounter Date: 01/02/2021   PT End of Session - 01/02/21 0855    Visit Number 14    Number of Visits 26    Date for PT Re-Evaluation 02/08/21    Authorization Type [employer] managed;  1/10 PN 4/21    Authorization Time Period Cert 3/35/45-02/17/62    PT Start Time 0845    PT Stop Time 0929    PT Time Calculation (min) 44 min    Equipment Utilized During Treatment Gait belt   iPad; treadmill; yellow TB; blue TB; AFO   Activity Tolerance Patient tolerated treatment well    Behavior During Therapy [facility] for tasks assessed/performed           Past Medical History:  Diagnosis Date  . Allergy   . Anemia   . Bell's palsy   . Diabetes mellitus without complication (HCC)    diet controlled  . Hypertension   . Hypothyroidism   . Kidney stones   . Pseudotumor cerebri   . Stroke [facility])     Past Surgical History:  Procedure Laterality Date  . COLONOSCOPY WITH PROPOFOL N/A 03/30/2020   Procedure: COLONOSCOPY WITH PROPOFOL;  Surgeon: Lesly Rubenstein, MD;  Location: [facility] ENDOSCOPY;  Service: Endoscopy;  Laterality: N/A;  . LOOP RECORDER INSERTION N/A 01/27/2018   Procedure: LOOP RECORDER INSERTION;  Surgeon: [name], MD;  Location: [place] CV LAB;  Service: Cardiovascular;  Laterality: N/A;  . LUMBAR PUNCTURE     as child  . NO PAST SURGERIES    . TEE WITHOUT CARDIOVERSION N/A 01/07/2018   Procedure: TRANSESOPHAGEAL ECHOCARDIOGRAM (TEE);  Surgeon: Minna Merritts, MD;  Location: [facility] ORS;  Service: Cardiovascular;  Laterality: N/A;    There were no vitals filed for this visit.   Subjective Assessment - 01/02/21 0854    Subjective Patient reports he is tired today  and did not sleep well. Denies any pain.    Pertinent History Referred by PCP for unsteadiness of gait. PMH: anemia, CKD4, IDDM, HTN on amlodipine, hydralazin, losartan, lasix; HLD< hypoTSH, neuropathy with gait disturbance on Lyrica; COVID PNA hospitalization.    Currently in Pain? No/denies          Interventions:   Nustep L3 x 6 min LE only- Patient reported mild difficulty keeping his knees in but otherwise no issues. Patient reports as easy to medium.  Ladder drill in // bars: Forward then backward x 6 trips each with no UE support (mod difficulty with retro gait) - Decreased step length and increased unsteadiness with retro gait requiring VC to correct.   Ladder drill - side step (inside of //bars) x 6 trip down and back. No significant difficulty  Ball toss with side step (inside of //bars) x 6 trips down and back- mild unsteadiness and difficulty with coordinating movements yet did improve with practice and VC/TC  Side stepping with green theraband around distal quads x length of //bars - x 3 trials without UE support.  Forward step with green theraband - Diagonal steps in // bars - 3 trials.   Sit to stand with ball squeeze x 5 reps then 5 without the ball due to increased difficulty initially requiring BUE support to stand- able  to progress to 1 UE without the ball.    Leg press 40 lb BLE (hips ER) (Cues to bring knees in as much as possible) - 2 sets x 12 reps with brief rest break.   Education provided throughout session via VC/TC and demonstration to facilitate movement at target joints and correct muscle activation for all testing and exercises performed.  Clinical Impression: Patient challenged with LE strengthening and balance activities today including walking backward today. He required VC and practice but did improve overall during session and able to complete all activities with min instruction and no significant loss of balance. The pt will benefit from further skilled  therapy to improve endurance, BLE strength and balance.                            PT Education - 01/02/21 1351    Education Details specific balance and exercise form    Person(s) Educated Patient    Methods Explanation;Demonstration;Tactile cues;Verbal cues    Comprehension Verbalized understanding;Returned demonstration;Verbal cues required;Tactile cues required;Need further instruction            PT Short Term Goals - 12/14/20 1450      PT SHORT TERM GOAL #1   Title Pt to demonstrate improved 5xSTS in <15sec without LOB    Baseline 20sec with 3 LOB at eval 4/21: 15.05 seconds with two near LOB    Time 4    Period Weeks    Status Partially Met    Target Date 01/11/21             PT Long Term Goals - 12/14/20 1409      PT LONG TERM GOAL #1   Title Pt to show improved FOTO score to >70.    Baseline FOTO: 59/100 at evaluation 4/21: 56.2%    Time 8    Period Weeks    Status On-going    Target Date 02/08/21      PT LONG TERM GOAL #2   Title Pt to demonstrate 5/5 ankle dorsiflexion bilat.    Baseline 4/5 bilat at eval 4/21:4+/5 left; 4/5 R pf 4+/5 df    Time 8    Period Weeks    Status Partially Met    Target Date 02/08/21      PT LONG TERM GOAL #3   Title Pt to demnonstrate SLS bilat >30sec, shod, AFO as needed    Baseline <3sec at eval 4/21: 4 seconds L 8 seconds R    Time 8    Period Weeks    Status Partially Met    Target Date 02/08/21      PT LONG TERM GOAL #4   Title Patient will increase six minute walk test distance to >1600 for progression to age norm community ambulator and improve gait ability    Baseline 4/21: 1185 ft with walking stick    Time 8    Period Weeks    Status Partially Met    Target Date 02/08/21                 Plan - 01/02/21 0856    Clinical Impression Statement Patient challenged with LE strengthening and balance activities today including walking backward today. He required VC and practice but  did improve overall during session and able to complete all activities with min instruction and no significant loss of balance. The pt will benefit from further skilled therapy to improve endurance, BLE strength  and balance.    Personal Factors and Comorbidities Age;Comorbidity 1;Fitness    Examination-Activity Limitations Locomotion Level;Transfers;Carry;Toileting;Stand;Sit    Examination-Participation Restrictions Cleaning;Meal Prep;Occupation    Stability/Clinical Decision Making Evolving/Moderate complexity    Rehab Potential Good    PT Frequency 2x / week    PT Duration 12 weeks    PT Treatment/Interventions Cryotherapy;Electrical Stimulation;Moist Heat;DME Instruction;Neuromuscular re-education;Therapeutic activities;Therapeutic exercise;Stair training;Gait training;Functional mobility training;Patient/family education;Orthotic Fit/Training    PT Next Visit Plan ankle stability and mobility    PT Home Exercise Plan no changes this session    Consulted and Agree with Plan of Care Patient           Patient will benefit from skilled therapeutic intervention in order to improve the following deficits and impairments:  Abnormal gait,Decreased coordination,Decreased range of motion,Difficulty walking,Cardiopulmonary status limiting activity,Decreased activity tolerance,Decreased balance,Decreased knowledge of precautions,Decreased knowledge of use of DME,Decreased mobility,Decreased strength,Impaired sensation,Postural dysfunction  Visit Diagnosis: Abnormality of gait and mobility  Difficulty in walking, not elsewhere classified  Muscle weakness (generalized)  Other lack of coordination     Problem List Patient Active Problem List   Diagnosis Date Noted  . History of colon polyps 10/15/2020  . Postoperative hemorrhage involving digestive system following digestive system procedure 09/19/2020  . Acute renal failure superimposed on stage 4 chronic kidney disease ([place]) 09/17/2020  .  Acute cholecystitis without calculus 09/09/2020  . Type 2 diabetes mellitus, with long-term current use of insulin ([place]) 09/09/2020  . Cryptogenic stroke ([place]) 07/13/2020  . History of loop recorder 07/13/2020  . Weakness 06/18/2020  . History of 2019 novel coronavirus disease (COVID-19) 05/20/2020  . CKD (chronic kidney disease) stage 4, GFR 15-29 ml/min (HCC) 04/04/2020  . Pneumonia due to COVID-19 virus 04/03/2020  . AKI (acute kidney injury) ([place]) 04/03/2020  . Elevated troponin 04/03/2020  . Acquired trigger finger 06/08/2019  . Lymphedema 06/08/2019  . Anemia 04/17/2019  . Swelling of left lower extremity 01/10/2019  . Facial droop 04/09/2018  . Daytime somnolence 03/30/2018  . Carotid stenosis 02/03/2018  . Intracranial vascular stenosis 09/12/2017  . Cough 01/20/2017  . Bell's palsy 11/10/2016  . History of CVA (cerebrovascular accident) 11/10/2016  . 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 History of nephrolithiasis 10/27/2016  . TIA (transient ischemic attack) 10/20/2016  . Near syncope 06/23/2016  . 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 Neuropathy 08/06/2015  . Health care maintenance 08/06/2015  . Essential hypertension 08/06/2015  . Heme positive stool 10/10/2013  . Hypothyroidism 10/10/2013  . Microalbuminuria 10/10/2013  . Hyperlipidemia 10/10/2013  . B12 deficiency 10/10/2013  . Diabetes ([place]) 07/04/2013  . Environmental allergies 07/04/2013    Lewis Moccasin, PT 01/02/2021, 2:02 PM  [place] MAIN [facility] SERVICES [address] [place], [place], [address] Phone: [phone]   Fax:  [phone]  Name: Gregory Crane MRN: [id number] Date of Birth: [birth year]

## 2021-01-04 ENCOUNTER — Ambulatory Visit: Payer: No Typology Code available for payment source

## 2021-01-04 ENCOUNTER — Other Ambulatory Visit: Payer: Self-pay

## 2021-01-04 DIAGNOSIS — M6281 Muscle weakness (generalized): Secondary | ICD-10-CM

## 2021-01-04 DIAGNOSIS — R262 Difficulty in walking, not elsewhere classified: Secondary | ICD-10-CM

## 2021-01-04 DIAGNOSIS — R269 Unspecified abnormalities of gait and mobility: Secondary | ICD-10-CM

## 2021-01-04 NOTE — Therapy (Signed)
[place] MAIN [facility] SERVICES [address] [place], [place], [address] Phone: [phone]   Fax:  [phone]  Physical Therapy Treatment  Patient Details  Name: Gregory Crane MRN: [id number] Date of Birth: [birth year] Referring Provider (PT): [name], MD   Encounter Date: 01/04/2021   PT End of Session - 01/04/21 0908    Visit Number 15    Number of Visits 26    Date for PT Re-Evaluation 02/08/21    Authorization Type [employer] managed;  5/10 PN 4/21    Authorization Time Period Cert 6/64/40-3/47/42    PT Start Time 0845    PT Stop Time 0929    PT Time Calculation (min) 44 min    Equipment Utilized During Treatment Gait belt   iPad; treadmill; yellow TB; blue TB; AFO   Activity Tolerance Patient tolerated treatment well    Behavior During Therapy [facility] for tasks assessed/performed           Past Medical History:  Diagnosis Date  . Allergy   . Anemia   . Bell's palsy   . Diabetes mellitus without complication (HCC)    diet controlled  . Hypertension   . Hypothyroidism   . Kidney stones   . Pseudotumor cerebri   . Stroke [facility])     Past Surgical History:  Procedure Laterality Date  . COLONOSCOPY WITH PROPOFOL N/A 03/30/2020   Procedure: COLONOSCOPY WITH PROPOFOL;  Surgeon: Lesly Rubenstein, MD;  Location: [facility] ENDOSCOPY;  Service: Endoscopy;  Laterality: N/A;  . LOOP RECORDER INSERTION N/A 01/27/2018   Procedure: LOOP RECORDER INSERTION;  Surgeon: [name], MD;  Location: [facility] LAB;  Service: Cardiovascular;  Laterality: N/A;  . LUMBAR PUNCTURE     as child  . NO PAST SURGERIES    . TEE WITHOUT CARDIOVERSION N/A 01/07/2018   Procedure: TRANSESOPHAGEAL ECHOCARDIOGRAM (TEE);  Surgeon: Minna Merritts, MD;  Location: [facility] ORS;  Service: Cardiovascular;  Laterality: N/A;    There were no vitals filed for this visit.   Subjective Assessment - 01/04/21 0849    Subjective Patient reports feeling off this  morning. Didn't sleep well last night, accidentally tossed his breakfast on his shirt.    Pertinent History Referred by PCP for unsteadiness of gait. PMH: anemia, CKD4, IDDM, HTN on amlodipine, hydralazin, losartan, lasix; HLD< hypoTSH, neuropathy with gait disturbance on Lyrica; COVID PNA hospitalization.    Currently in Pain? No/denies           Patient reports feeling off this morning. Didn't sleep well last night, accidentally tossed his breakfast on his shirt.   BP at start of session: 167/54   Treatment:  6 cones:  -weave between 6 cones with cues for negotiation of obstacles and foot clearance -forward/backwards stepping/zigzag through with safety awareness education with backwards stepping x4 trials -lateral step with toe taps each cone x4 trials frequent knocking cones over, three near LOB   standing next to support bar  -lateral step up/down 6" step 10x each side; very challenging for patient requires UE support, very short of breathe by end -step up/down 8x each LE, UE support; very challenged with LLE more than RLE -3 way hip raise, BUE support 8x each LE  -heel raise 15x with BUE support    Seated:   sit to stand 10x; no UE support. One LOB falling back into chair RTB hamstring curl 15x each LE  RTB around ankles: alternating LAQ 12x each LE RTB around  ankles: alternating ER/IR 10x each LE  Toe taps to sticky notes x 30 seconds for coordination/spatial awareness and sequencing   Education provided throughout session via VC/TC and demonstration to facilitate movement at target joints and correct muscle activation for all testing and exercises performed.   The patient is challenged with LLE>RLE weakness with leg fatiguing quickly. He requires close CGA due to LOB with single limb tasks.  Occasional seated rest breaks required throughout physical therapy session. The pt will benefit from further skilled therapy to improve endurance, BLE strength and  balance.                        PT Education - 01/04/21 0849    Education Details exercise technique, body mechanics    Person(s) Educated Patient    Methods Explanation;Tactile cues;Demonstration;Verbal cues    Comprehension Verbalized understanding;Returned demonstration;Verbal cues required;Tactile cues required            PT Short Term Goals - 12/14/20 1450      PT SHORT TERM GOAL #1   Title Pt to demonstrate improved 5xSTS in <15sec without LOB    Baseline 20sec with 3 LOB at eval 4/21: 15.05 seconds with two near LOB    Time 4    Period Weeks    Status Partially Met    Target Date 01/11/21             PT Long Term Goals - 12/14/20 1409      PT LONG TERM GOAL #1   Title Pt to show improved FOTO score to >70.    Baseline FOTO: 59/100 at evaluation 4/21: 56.2%    Time 8    Period Weeks    Status On-going    Target Date 02/08/21      PT LONG TERM GOAL #2   Title Pt to demonstrate 5/5 ankle dorsiflexion bilat.    Baseline 4/5 bilat at eval 4/21:4+/5 left; 4/5 R pf 4+/5 df    Time 8    Period Weeks    Status Partially Met    Target Date 02/08/21      PT LONG TERM GOAL #3   Title Pt to demnonstrate SLS bilat >30sec, shod, AFO as needed    Baseline <3sec at eval 4/21: 4 seconds L 8 seconds R    Time 8    Period Weeks    Status Partially Met    Target Date 02/08/21      PT LONG TERM GOAL #4   Title Patient will increase six minute walk test distance to >1600 for progression to age norm community ambulator and improve gait ability    Baseline 4/21: 1185 ft with walking stick    Time 8    Period Weeks    Status Partially Met    Target Date 02/08/21                 Plan - 01/04/21 0914    Clinical Impression Statement The patient is challenged with LLE>RLE weakness with leg fatiguing quickly. He requires close CGA due to LOB with single limb tasks.  Occasional seated rest breaks required throughout physical therapy session. The pt  will benefit from further skilled therapy to improve endurance, BLE strength and balance.    Personal Factors and Comorbidities Age;Comorbidity 1;Fitness    Examination-Activity Limitations Locomotion Level;Transfers;Carry;Toileting;Stand;Sit    Examination-Participation Restrictions Cleaning;Meal Prep;Occupation    Stability/Clinical Decision Making Evolving/Moderate complexity    Rehab Potential Good  PT Frequency 2x / week    PT Duration 12 weeks    PT Treatment/Interventions Cryotherapy;Electrical Stimulation;Moist Heat;DME Instruction;Neuromuscular re-education;Therapeutic activities;Therapeutic exercise;Stair training;Gait training;Functional mobility training;Patient/family education;Orthotic Fit/Training    PT Next Visit Plan ankle stability and mobility    PT Home Exercise Plan no changes this session    Consulted and Agree with Plan of Care Patient           Patient will benefit from skilled therapeutic intervention in order to improve the following deficits and impairments:  Abnormal gait,Decreased coordination,Decreased range of motion,Difficulty walking,Cardiopulmonary status limiting activity,Decreased activity tolerance,Decreased balance,Decreased knowledge of precautions,Decreased knowledge of use of DME,Decreased mobility,Decreased strength,Impaired sensation,Postural dysfunction  Visit Diagnosis: Abnormality of gait and mobility  Difficulty in walking, not elsewhere classified  Muscle weakness (generalized)     Problem List Patient Active Problem List   Diagnosis Date Noted  . History of colon polyps 10/15/2020  . Postoperative hemorrhage involving digestive system following digestive system procedure 09/19/2020  . Acute renal failure superimposed on stage 4 chronic kidney disease ([place]) 09/17/2020  . Acute cholecystitis without calculus 09/09/2020  . Type 2 diabetes mellitus, with long-term current use of insulin ([place]) 09/09/2020  . Cryptogenic stroke ([place])  07/13/2020  . History of loop recorder 07/13/2020  . Weakness 06/18/2020  . History of 2019 novel coronavirus disease (COVID-19) 05/20/2020  . CKD (chronic kidney disease) stage 4, GFR 15-29 ml/min (HCC) 04/04/2020  . Pneumonia due to COVID-19 virus 04/03/2020  . AKI (acute kidney injury) ([place]) 04/03/2020  . Elevated troponin 04/03/2020  . Acquired trigger finger 06/08/2019  . Lymphedema 06/08/2019  . Anemia 04/17/2019  . Swelling of left lower extremity 01/10/2019  . Facial droop 04/09/2018  . Daytime somnolence 03/30/2018  . Carotid stenosis 02/03/2018  . Intracranial vascular stenosis 09/12/2017  . Cough 01/20/2017  . Bell's palsy 11/10/2016  . History of CVA (cerebrovascular accident) 11/10/2016  . 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 History of nephrolithiasis 10/27/2016  . TIA (transient ischemic attack) 10/20/2016  . Near syncope 06/23/2016  . 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 Neuropathy 08/06/2015  . Health care maintenance 08/06/2015  . Essential hypertension 08/06/2015  . Heme positive stool 10/10/2013  . Hypothyroidism 10/10/2013  . Microalbuminuria 10/10/2013  . Hyperlipidemia 10/10/2013  . B12 deficiency 10/10/2013  . Diabetes ([place]) 07/04/2013  . Environmental allergies 07/04/2013   Janna Arch, PT, DPT   01/04/2021, 9:37 AM  [place] MAIN [facility] SERVICES [address] [place], [place], [address] Phone: [phone]   Fax:  [phone]  Name: Jayr Lupercio MRN: [id number] Date of Birth: Jun 10, 1971

## 2021-01-08 ENCOUNTER — Encounter: Payer: Self-pay | Admitting: Internal Medicine

## 2021-01-08 ENCOUNTER — Other Ambulatory Visit: Payer: Self-pay

## 2021-01-08 ENCOUNTER — Ambulatory Visit (INDEPENDENT_AMBULATORY_CARE_PROVIDER_SITE_OTHER): Payer: No Typology Code available for payment source | Admitting: Internal Medicine

## 2021-01-08 VITALS — BP 136/76 | HR 73 | Temp 97.9°F | Resp 16 | Ht 72.0 in | Wt 295.6 lb

## 2021-01-08 DIAGNOSIS — M7989 Other specified soft tissue disorders: Secondary | ICD-10-CM

## 2021-01-08 DIAGNOSIS — Z8673 Personal history of transient ischemic attack (TIA), and cerebral infarction without residual deficits: Secondary | ICD-10-CM

## 2021-01-08 DIAGNOSIS — D649 Anemia, unspecified: Secondary | ICD-10-CM

## 2021-01-08 DIAGNOSIS — N183 Chronic kidney disease, stage 3 unspecified: Secondary | ICD-10-CM

## 2021-01-08 DIAGNOSIS — Z86010 Personal history of colonic polyps: Secondary | ICD-10-CM

## 2021-01-08 DIAGNOSIS — E1122 Type 2 diabetes mellitus with diabetic chronic kidney disease: Secondary | ICD-10-CM

## 2021-01-08 DIAGNOSIS — Z794 Long term (current) use of insulin: Secondary | ICD-10-CM | POA: Diagnosis not present

## 2021-01-08 DIAGNOSIS — E11319 Type 2 diabetes mellitus with unspecified diabetic retinopathy without macular edema: Secondary | ICD-10-CM

## 2021-01-08 DIAGNOSIS — I10 Essential (primary) hypertension: Secondary | ICD-10-CM | POA: Diagnosis not present

## 2021-01-08 DIAGNOSIS — E538 Deficiency of other specified B group vitamins: Secondary | ICD-10-CM

## 2021-01-08 DIAGNOSIS — E785 Hyperlipidemia, unspecified: Secondary | ICD-10-CM

## 2021-01-08 DIAGNOSIS — G629 Polyneuropathy, unspecified: Secondary | ICD-10-CM

## 2021-01-08 DIAGNOSIS — N184 Chronic kidney disease, stage 4 (severe): Secondary | ICD-10-CM

## 2021-01-08 DIAGNOSIS — E039 Hypothyroidism, unspecified: Secondary | ICD-10-CM

## 2021-01-08 LAB — BASIC METABOLIC PANEL
BUN: 71 mg/dL — ABNORMAL HIGH (ref 6–23)
CO2: 24 mEq/L (ref 19–32)
Calcium: 8.6 mg/dL (ref 8.4–10.5)
Chloride: 106 mEq/L (ref 96–112)
Creatinine, Ser: 4.27 mg/dL — ABNORMAL HIGH (ref 0.40–1.50)
GFR: 15.38 mL/min — ABNORMAL LOW (ref >60.00)
Glucose, Bld: 267 mg/dL — ABNORMAL HIGH (ref 70–99)
Potassium: 5.1 mEq/L (ref 3.5–5.1)
Sodium: 139 mEq/L (ref 135–145)

## 2021-01-08 LAB — HEPATIC FUNCTION PANEL
ALT: 8 U/L (ref 0–53)
AST: 10 U/L (ref 0–37)
Albumin: 3.9 g/dL (ref 3.5–5.2)
Alkaline Phosphatase: 106 U/L (ref 39–117)
Bilirubin, Direct: 0.1 mg/dL (ref 0.0–0.3)
Total Bilirubin: 0.4 mg/dL (ref 0.2–1.2)
Total Protein: 6.7 g/dL (ref 6.0–8.3)

## 2021-01-08 LAB — FERRITIN: Ferritin: 89.6 ng/mL (ref 22.0–322.0)

## 2021-01-08 LAB — VITAMIN B12: Vitamin B-12: 153 pg/mL — ABNORMAL LOW (ref 211–911)

## 2021-01-08 LAB — LIPID PANEL
Cholesterol: 127 mg/dL (ref 0–200)
HDL: 28.9 mg/dL — ABNORMAL LOW (ref >39.00)
LDL Cholesterol: 66 mg/dL (ref 0–99)
NonHDL: 98.13
Total CHOL/HDL Ratio: 4
Triglycerides: 159 mg/dL — ABNORMAL HIGH (ref 0.0–149.0)
VLDL: 31.8 mg/dL (ref 0.0–40.0)

## 2021-01-08 LAB — CBC WITH DIFFERENTIAL/PLATELET
Basophils Absolute: 0 10*3/uL (ref 0.0–0.1)
Basophils Relative: 0.8 % (ref 0.0–3.0)
Eosinophils Absolute: 0.2 10*3/uL (ref 0.0–0.7)
Eosinophils Relative: 4.7 % (ref 0.0–5.0)
HCT: 27.3 % — ABNORMAL LOW (ref 39.0–52.0)
Hemoglobin: 9.4 g/dL — ABNORMAL LOW (ref 13.0–17.0)
Lymphocytes Relative: 21.1 % (ref 12.0–46.0)
Lymphs Abs: 1 10*3/uL (ref 0.7–4.0)
MCHC: 34.3 g/dL (ref 30.0–36.0)
MCV: 84.8 fl (ref 78.0–100.0)
Monocytes Absolute: 0.4 10*3/uL (ref 0.1–1.0)
Monocytes Relative: 7.4 % (ref 3.0–12.0)
Neutro Abs: 3.3 10*3/uL (ref 1.4–7.7)
Neutrophils Relative %: 66 % (ref 43.0–77.0)
Platelets: 176 10*3/uL (ref 150.0–400.0)
RBC: 3.22 Mil/uL — ABNORMAL LOW (ref 4.22–5.81)
RDW: 13.1 % (ref 11.5–15.5)
WBC: 5 10*3/uL (ref 4.0–10.5)

## 2021-01-08 LAB — TSH: TSH: 22.7 u[IU]/mL — ABNORMAL HIGH (ref 0.35–4.50)

## 2021-01-08 LAB — HEMOGLOBIN A1C: Hgb A1c MFr Bld: 9.9 % — ABNORMAL HIGH (ref 4.6–6.5)

## 2021-01-08 NOTE — Progress Notes (Signed)
Patient ID: Gregory Crane, male   DOB: 1971-08-16, 50 y.o.   MRN: [id number]   Subjective:    Patient ID: Gregory Crane, male    DOB: 1971-08-25, 50 y.o.   MRN: [id number]  HPI This visit occurred during the SARS-CoV-2 public health emergency.  Safety protocols were in place, including screening questions prior to the visit, additional usage of staff PPE, and extensive cleaning of exam room while observing appropriate contact time as indicated for disinfecting solutions.  Patient here for a scheduled follow up.  Here to follow up regarding his diabetes, cholesterol, blood pressure and increased stress.  Still trying to cope with his wife's passing.  Continues to have a hard time with this.  Was seeing a Social worker (through [employer]).  She left and he has not established with anyone else.  Discussed.  He desires not to establish at this time.  No suicidal thoughts.  Will notify me if changes his mind.  He brought in no sugar readings.  Had started using Libre 2 CGM, but had issues with recurrent alarms for low blood sugars.  States he has not had any issues with low sugars.  Finger sticks now and states when checking - 160s in am and 120s-130s at other times.  Apparently not checking now.  Is going to PT to help with gait and balance.  No chest pain or sob reported.  No nausea or vomiting reported.  Has noticed increased pedal and lower extremity swelling.  Noticed - recurring.  Outside blood pressure checks:  130-140/85-87.  Increased fatigue.  Is eating.  Has started [employer] - to help with meal choices.     Past Medical History:  Diagnosis Date  . Allergy   . Anemia   . Bell's palsy   . Diabetes mellitus without complication (HCC)    diet controlled  . Hypertension   . Hypothyroidism   . Kidney stones   . Pseudotumor cerebri   . Stroke [facility])    Past Surgical History:  Procedure Laterality Date  . COLONOSCOPY WITH PROPOFOL N/A 03/30/2020   Procedure: COLONOSCOPY WITH PROPOFOL;  Surgeon:  Lesly Rubenstein, MD;  Location: [facility] ENDOSCOPY;  Service: Endoscopy;  Laterality: N/A;  . LOOP RECORDER INSERTION N/A 01/27/2018   Procedure: LOOP RECORDER INSERTION;  Surgeon: [name], MD;  Location: [facility];  Service: Cardiovascular;  Laterality: N/A;  . LUMBAR PUNCTURE     as child  . NO PAST SURGERIES    . TEE WITHOUT CARDIOVERSION N/A 01/07/2018   Procedure: TRANSESOPHAGEAL ECHOCARDIOGRAM (TEE);  Surgeon: Minna Merritts, MD;  Location: [facility] ORS;  Service: Cardiovascular;  Laterality: N/A;   Family History  Problem Relation Age of Onset  . Breast cancer Mother   . Diabetes Father   . Diabetes Sister   . Arthritis Maternal Grandmother   . Diabetes Maternal Grandmother    Social History   Socioeconomic History  . Marital status: Married    Spouse name: [name]  . Number of children: 2  . Years of education: Not on file  . Highest education level: Not on file  Occupational History  . Not on file  Tobacco Use  . Smoking status: Never Smoker  . Smokeless tobacco: Never Used  Vaping Use  . Vaping Use: Never used  Substance and Sexual Activity  . Alcohol use: No    Alcohol/week: 0.0 standard drinks    Comment: very rarely (1-2/year)  . Drug use: No  . Sexual  activity: Yes  Other Topics Concern  . Not on file  Social History Narrative   Lives at home with wife, independent at baseline.   Social Determinants of Health   Financial Resource Strain: Low Risk   . Difficulty of Paying Living Expenses: Not hard at all  Food Insecurity: Not on file  Transportation Needs: Not on file  Physical Activity: Not on file  Stress: Not on file  Social Connections: Not on file    Outpatient Encounter Medications as of 01/08/2021  Medication Sig  . Accu-Chek FastClix Lancets MISC USE TO CHECK BLOOD SUGAR THREE TIMES DAILY AS DIRECTED  . amLODipine (NORVASC) 10 MG tablet TAKE 1 TABLET(10 MG) BY MOUTH DAILY  . ascorbic acid (VITAMIN C) 500 MG tablet Take 1  tablet (500 mg total) by mouth daily.  Marland Kitchen aspirin (ASPIRIN LOW DOSE) 81 MG EC tablet TAKE 1 TABLET(81 MG) BY MOUTH DAILY  . atorvastatin (LIPITOR) 80 MG tablet TAKE 1 TABLET(80 MG) BY MOUTH DAILY  . cholecalciferol (VITAMIN D3) 25 MCG (1000 UNIT) tablet Take 1,000 Units by mouth daily.  . clopidogrel (PLAVIX) 75 MG tablet TAKE 1 TABLET(75 MG) BY MOUTH DAILY  . cyanocobalamin (,VITAMIN B-12,) 1000 MCG/ML injection INJECT 1 ML INTO THE SKIN ONCE A WEEK FOR 3 WEEKS THEN 1ML EVERY 30 DAYS  . DULoxetine (CYMBALTA) 20 MG capsule TAKE 1 CAPSULE(20 MG) BY MOUTH DAILY  . furosemide (LASIX) 20 MG tablet TAKE 3 TABLETS(60 MG) BY MOUTH TWICE DAILY  . glucose blood (FREESTYLE LITE) test strip Check blood sugars twice a day (Dx. 250.02)  . HUMALOG 100 UNIT/ML injection INJECT AS DIRECTED PER SLIDING SCALE 70-200: 0 UNITS, 201-250: 1 UNIT, 251-300: 2 UNITS, 301-350: 3 UNITS,>350: 4 UNITS  . hydrALAZINE (APRESOLINE) 50 MG tablet TAKE 1 TABLET(50 MG) BY MOUTH THREE TIMES DAILY  . insulin glargine (LANTUS) 100 UNIT/ML injection ADMINISTER 5 UNITS UNDER THE SKIN EVERY NIGHT  . Insulin Syringe-Needle U-100 (INSULIN SYRINGE .5CC/30GX5/16") 30G X 5/16" 0.5 ML MISC USE AS DIRECTED WITH INSULIN (Patient not taking: Reported on 12/22/2020)  . levothyroxine (SYNTHROID) 150 MCG tablet Take 1 tablet (150 mcg total) by mouth daily before breakfast.  . losartan (COZAAR) 100 MG tablet TAKE 1 TABLET BY MOUTH DAILY  . metolazone (ZAROXOLYN) 2.5 MG tablet Take 2.5 mg by mouth. Twice weekly  . potassium chloride SA (KLOR-CON) 20 MEQ tablet Take 20 mEq by mouth.  . pregabalin (LYRICA) 100 MG capsule TAKE 1 CAPSULE(100 MG) BY MOUTH THREE TIMES DAILY  . Semaglutide, 1 MG/DOSE, (OZEMPIC, 1 MG/DOSE,) 4 MG/3ML SOPN Inject 0.75 mLs (1 mg total) into the skin once a week.  . Syringe/Needle, Disp, (SYRINGE 3CC/25GX1") 25G X 1" 3 ML MISC Use as directed to administer b12 injections.  Marland Kitchen zinc sulfate 220 (50 Zn) MG capsule Take 1 capsule (220  mg total) by mouth daily.  . [DISCONTINUED] Continuous Blood Gluc Sensor (FREESTYLE LIBRE 2 SENSOR) MISC Use to check glucose at least TID (Patient not taking: Reported on 12/22/2020)   No facility-administered encounter medications on file as of 01/08/2021.    Review of Systems  Constitutional: Positive for fatigue.       Weight is up from last check.    HENT: Negative for congestion and sinus pressure.   Respiratory: Negative for cough and chest tightness.        Breathing stable.   Cardiovascular: Positive for leg swelling. Negative for chest pain and palpitations.  Gastrointestinal: Negative for abdominal pain, diarrhea, nausea and vomiting.  Genitourinary: Negative for difficulty urinating and dysuria.       Does report some urinary urgency.   Musculoskeletal: Negative for joint swelling and myalgias.  Skin: Negative for color change and rash.  Neurological: Negative for dizziness, light-headedness and headaches.  Psychiatric/Behavioral: Negative for agitation and suicidal ideas.       Increased stress and depression as outlined.        Objective:    Physical Exam Vitals reviewed.  Constitutional:      General: He is not in acute distress.    Appearance: Normal appearance. He is well-developed.  HENT:     Head: Normocephalic and atraumatic.     Right Ear: External ear normal.     Left Ear: External ear normal.  Eyes:     General: No scleral icterus.       Right eye: No discharge.        Left eye: No discharge.     Conjunctiva/sclera: Conjunctivae normal.  Cardiovascular:     Rate and Rhythm: Normal rate and regular rhythm.  Pulmonary:     Effort: Pulmonary effort is normal. No respiratory distress.     Breath sounds: Normal breath sounds.  Abdominal:     General: Bowel sounds are normal.     Palpations: Abdomen is soft.     Tenderness: There is no abdominal tenderness.  Musculoskeletal:        General: No tenderness.     Cervical back: Neck supple. No tenderness.      Comments: Increased pedal and lower extremity swelling.    Lymphadenopathy:     Cervical: No cervical adenopathy.  Skin:    Findings: No erythema or rash.  Neurological:     Mental Status: He is alert.  Psychiatric:        Mood and Affect: Mood normal.        Behavior: Behavior normal.     BP 136/76   Pulse 73   Temp 97.9 F (36.6 C)   Resp 16   Ht 6' (1.829 m)   Wt 295 lb 9.6 oz (134.1 kg)   SpO2 98%   BMI 40.09 kg/m  Wt Readings from Last 3 Encounters:  01/08/21 295 lb 9.6 oz (134.1 kg)  12/01/20 280 lb (127 kg)  10/31/20 280 lb (127 kg)     Lab Results  Component Value Date   WBC 5.0 01/08/2021   HGB 9.4 (L) 01/08/2021   HCT 27.3 (L) 01/08/2021   PLT 176.0 01/08/2021   GLUCOSE 267 (H) 01/08/2021   CHOL 127 01/08/2021   TRIG 159.0 (H) 01/08/2021   HDL 28.90 (L) 01/08/2021   LDLCALC 66 01/08/2021   ALT 8 01/08/2021   AST 10 01/08/2021   NA 139 01/08/2021   K 5.1 01/08/2021   CL 106 01/08/2021   CREATININE 4.27 (H) 01/08/2021   BUN 71 (H) 01/08/2021   CO2 24 01/08/2021   TSH 22.70 (H) 01/08/2021   INR 0.85 04/09/2018   HGBA1C 9.9 (H) 01/08/2021       Assessment & Plan:   Problem List Items Addressed This Visit    Anemia    hgb was improved last check.  Seeing hematology.  Recheck cbc today.       Relevant Orders   CBC with Differential/Platelet (Completed)   Ferritin (Completed)   B12 deficiency    Has not been taking B12 injections.  Recheck B12 level today.        Relevant Orders   Vitamin B12 (  Completed)   CKD (chronic kidney disease) stage 4, GFR 15-29 ml/min (HCC)    Avoid antiinflammatories.  Followed by Dr Lanora Manis.  Overdue f/u.  Check metabolic panel.       Diabetes ([place])    Not checking sugars regularly.  Reported sugars as outlined.  Discussed diet and exercise.  Need to check sugars regularly.  Continue lantus, humalog with meals and ozempic.  Check met b and a1c.       Relevant Orders   Hemoglobin A1c (Completed)    Essential hypertension - Primary    Continue losartan, hydralazine and amlodipine.  Also taking zaroxolyn and lasix.  Pressures as outlined.  Follow pressures.  Follow metabolic panel.       Relevant Orders   TSH (Completed)   Basic metabolic panel (Completed)   History of colon polyps    Colonoscopy 03/2020 - multiple polyps.  Recommended f/u in 6 months.  Overdue.  Will need to schedule f/u.       History of CVA (cerebrovascular accident)    Continue plavix and lipitor.       Hyperlipidemia    Continue lipitor.  Low cholesterol diet and exercise.  Follow lipid panel and liver function tests.        Relevant Orders   Hepatic function panel (Completed)   Lipid panel (Completed)   Hypothyroidism    On thyroid replacement.  Follow tsh.       Neuropathy    Continue lyrica and PT.       Swelling of left lower extremity    Has seen [facility] - swelling was improved.  Increased swelling now. Avoid increased sodium intake.  Elevate legs.  Continue lasix and zaroxolyn.        Type 2 diabetes mellitus, with long-term current use of insulin (HCC)   Relevant Orders   Hemoglobin A1c (Completed)       [name], MD

## 2021-01-09 ENCOUNTER — Ambulatory Visit: Payer: No Typology Code available for payment source

## 2021-01-09 DIAGNOSIS — R2689 Other abnormalities of gait and mobility: Secondary | ICD-10-CM

## 2021-01-09 DIAGNOSIS — R2681 Unsteadiness on feet: Secondary | ICD-10-CM

## 2021-01-09 DIAGNOSIS — M6281 Muscle weakness (generalized): Secondary | ICD-10-CM

## 2021-01-09 DIAGNOSIS — R269 Unspecified abnormalities of gait and mobility: Secondary | ICD-10-CM | POA: Diagnosis not present

## 2021-01-09 NOTE — Assessment & Plan Note (Signed)
Has not been taking B12 injections.  Recheck B12 level today.

## 2021-01-09 NOTE — Assessment & Plan Note (Signed)
Colonoscopy 03/2020 - multiple polyps.  Recommended f/u in 6 months.  Overdue.  Will need to schedule f/u.  

## 2021-01-09 NOTE — Assessment & Plan Note (Signed)
Has seen [facility] - swelling was improved.  Increased swelling now. Avoid increased sodium intake.  Elevate legs.  Continue lasix and zaroxolyn.

## 2021-01-09 NOTE — Assessment & Plan Note (Signed)
Continue lipitor.  Low cholesterol diet and exercise.  Follow lipid panel and liver function tests.   

## 2021-01-09 NOTE — Assessment & Plan Note (Signed)
Avoid antiinflammatories.  Followed by Dr Lanora Manis.  Overdue f/u.  Check metabolic panel.

## 2021-01-09 NOTE — Assessment & Plan Note (Signed)
Continue losartan, hydralazine and amlodipine.  Also taking zaroxolyn and lasix.  Pressures as outlined.  Follow pressures.  Follow metabolic panel.

## 2021-01-09 NOTE — Assessment & Plan Note (Signed)
Continue lyrica and PT.

## 2021-01-09 NOTE — Assessment & Plan Note (Signed)
On thyroid replacement.  Follow tsh.  

## 2021-01-09 NOTE — Assessment & Plan Note (Signed)
hgb was improved last check.  Seeing hematology.  Recheck cbc today.

## 2021-01-09 NOTE — Therapy (Signed)
[place] MAIN [facility] SERVICES [address] [place], [place], [address] Phone: [phone]   Fax:  [phone]  Physical Therapy Treatment  Patient Details  Name: Gregory Crane MRN: [id number] Date of Birth: 1971/07/29 Referring Provider (PT): [name], MD   Encounter Date: 01/09/2021   PT End of Session - 01/09/21 1539    Visit Number 16    Number of Visits 26    Date for PT Re-Evaluation 02/08/21    Authorization Type [employer] managed;  5/10 PN 4/21    Authorization Time Period Cert 4/70/96-2/83/66    PT Start Time 1031    PT Stop Time 1100    PT Time Calculation (min) 29 min    Equipment Utilized During Treatment Gait belt   iPad; treadmill; yellow TB; blue TB; AFO   Activity Tolerance Patient tolerated treatment well    Behavior During Therapy [facility] for tasks assessed/performed           Past Medical History:  Diagnosis Date  . Allergy   . Anemia   . Bell's palsy   . Diabetes mellitus without complication (HCC)    diet controlled  . Hypertension   . Hypothyroidism   . Kidney stones   . Pseudotumor cerebri   . Stroke [facility])     Past Surgical History:  Procedure Laterality Date  . COLONOSCOPY WITH PROPOFOL N/A 03/30/2020   Procedure: COLONOSCOPY WITH PROPOFOL;  Surgeon: Lesly Rubenstein, MD;  Location: [facility] ENDOSCOPY;  Service: Endoscopy;  Laterality: N/A;  . LOOP RECORDER INSERTION N/A 01/27/2018   Procedure: LOOP RECORDER INSERTION;  Surgeon: [name], MD;  Location: [place] CV LAB;  Service: Cardiovascular;  Laterality: N/A;  . LUMBAR PUNCTURE     as child  . NO PAST SURGERIES    . TEE WITHOUT CARDIOVERSION N/A 01/07/2018   Procedure: TRANSESOPHAGEAL ECHOCARDIOGRAM (TEE);  Surgeon: Minna Merritts, MD;  Location: [facility] ORS;  Service: Cardiovascular;  Laterality: N/A;    There were no vitals filed for this visit.   Subjective Assessment - 01/09/21 1033    Subjective Pt reports no pain. He reports no  falls or near falls. He says he can probably walk for a while before getting out of breath    Pertinent History Referred by PCP for unsteadiness of gait. PMH: anemia, CKD4, IDDM, HTN on amlodipine, hydralazin, losartan, lasix; HLD< hypoTSH, neuropathy with gait disturbance on Lyrica; COVID PNA hospitalization.    Currently in Pain? No/denies           TREATMENT  Neuro Re-Education: close CGA-min assist for all the following  BP at beginning of session:  160/61 HR 61 bpm  Pt says BP has been OK. Denies N/V, HA.    Obstacle course: -weave between 6 cones and step over hurlde, use of SPC, with continued cues for negotiation of obstacles and foot clearance 10x  Lateral stepping with toe taps onto cones - 2x20 each LE. Performed at support bar. Pt used UUE support to 3-finger support.  Modified SLB with foot on 6" step - 2 min each LE. Intermittent UE support  Modified SLB with foot on 6" step and other foot on airex - 1 min each LE. Intermittent UE support.  Tandem stance - 2x30 sec each LE. At support bar. Intermittent UE support.   Education provided throughout session primarily in the form of VC/Demo to facilitate improved technique with muscle activation and movement at target joints. Pt exhibits good carryover within  session after cuing.    PT Education - 01/09/21 1539    Education Details exercise technique, body mechanics    Person(s) Educated Patient    Methods Explanation;Demonstration;Verbal cues    Comprehension Verbalized understanding;Returned demonstration            PT Short Term Goals - 12/14/20 1450      PT SHORT TERM GOAL #1   Title Pt to demonstrate improved 5xSTS in <15sec without LOB    Baseline 20sec with 3 LOB at eval 4/21: 15.05 seconds with two near LOB    Time 4    Period Weeks    Status Partially Met    Target Date 01/11/21             PT Long Term Goals - 12/14/20 1409      PT LONG TERM GOAL #1   Title Pt to show improved FOTO score to  >70.    Baseline FOTO: 59/100 at evaluation 4/21: 56.2%    Time 8    Period Weeks    Status On-going    Target Date 02/08/21      PT LONG TERM GOAL #2   Title Pt to demonstrate 5/5 ankle dorsiflexion bilat.    Baseline 4/5 bilat at eval 4/21:4+/5 left; 4/5 R pf 4+/5 df    Time 8    Period Weeks    Status Partially Met    Target Date 02/08/21      PT LONG TERM GOAL #3   Title Pt to demnonstrate SLS bilat >30sec, shod, AFO as needed    Baseline <3sec at eval 4/21: 4 seconds L 8 seconds R    Time 8    Period Weeks    Status Partially Met    Target Date 02/08/21      PT LONG TERM GOAL #4   Title Patient will increase six minute walk test distance to >1600 for progression to age norm community ambulator and improve gait ability    Baseline 4/21: 1185 ft with walking stick    Time 8    Period Weeks    Status Partially Met    Target Date 02/08/21                 Plan - 01/09/21 1546    Clinical Impression Statement Session limited secondary to pt late arrival. Pt challenged with all balance tasks, both static and dynamic. He requires CGA-min assist throughout session. The pt will benefit from further skilled PT to improve balance in order to decrease risk of falls.    Personal Factors and Comorbidities Age;Comorbidity 1;Fitness    Examination-Activity Limitations Locomotion Level;Transfers;Carry;Toileting;Stand;Sit    Examination-Participation Restrictions Cleaning;Meal Prep;Occupation    Stability/Clinical Decision Making Evolving/Moderate complexity    Rehab Potential Good    PT Frequency 2x / week    PT Duration 12 weeks    PT Treatment/Interventions Cryotherapy;Electrical Stimulation;Moist Heat;DME Instruction;Neuromuscular re-education;Therapeutic activities;Therapeutic exercise;Stair training;Gait training;Functional mobility training;Patient/family education;Orthotic Fit/Training    PT Next Visit Plan ankle stability and mobility, dynamic balance    PT Home Exercise  Plan no changes this session    Consulted and Agree with Plan of Care Patient           Patient will benefit from skilled therapeutic intervention in order to improve the following deficits and impairments:  Abnormal gait,Decreased coordination,Decreased range of motion,Difficulty walking,Cardiopulmonary status limiting activity,Decreased activity tolerance,Decreased balance,Decreased knowledge of precautions,Decreased knowledge of use of DME,Decreased mobility,Decreased strength,Impaired sensation,Postural dysfunction  Visit Diagnosis:  Unsteadiness on feet  Other abnormalities of gait and mobility  Muscle weakness (generalized)     Problem List Patient Active Problem List   Diagnosis Date Noted  . History of colon polyps 10/15/2020  . Postoperative hemorrhage involving digestive system following digestive system procedure 09/19/2020  . Acute renal failure superimposed on stage 4 chronic kidney disease ([place]) 09/17/2020  . Acute cholecystitis without calculus 09/09/2020  . Type 2 diabetes mellitus, with long-term current use of insulin ([place]) 09/09/2020  . Cryptogenic stroke ([place]) 07/13/2020  . History of loop recorder 07/13/2020  . Weakness 06/18/2020  . History of 2019 novel coronavirus disease (COVID-19) 05/20/2020  . CKD (chronic kidney disease) stage 4, GFR 15-29 ml/min (HCC) 04/04/2020  . Pneumonia due to COVID-19 virus 04/03/2020  . AKI (acute kidney injury) ([place]) 04/03/2020  . Elevated troponin 04/03/2020  . Acquired trigger finger 06/08/2019  . Lymphedema 06/08/2019  . Anemia 04/17/2019  . Swelling of left lower extremity 01/10/2019  . Facial droop 04/09/2018  . Daytime somnolence 03/30/2018  . Carotid stenosis 02/03/2018  . Intracranial vascular stenosis 09/12/2017  . Cough 01/20/2017  . Bell's palsy 11/10/2016  . History of CVA (cerebrovascular accident) 11/10/2016  . 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 History of  nephrolithiasis 10/27/2016  . TIA (transient ischemic attack) 10/20/2016  . Near syncope 06/23/2016  . 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 Neuropathy 08/06/2015  . Health care maintenance 08/06/2015  . Essential hypertension 08/06/2015  . Heme positive stool 10/10/2013  . Hypothyroidism 10/10/2013  . Microalbuminuria 10/10/2013  . Hyperlipidemia 10/10/2013  . B12 deficiency 10/10/2013  . Diabetes ([place]) 07/04/2013  . Environmental allergies 07/04/2013   Ricard Dillon PT, DPT 01/09/2021, 3:47 PM  [place] MAIN [facility] SERVICES [address] [place], [place], [address] Phone: [phone]   Fax:  [phone]  Name: Gregory Crane MRN: [id number] Date of Birth: 1971-06-11

## 2021-01-09 NOTE — Assessment & Plan Note (Signed)
Continue plavix and lipitor. ?

## 2021-01-09 NOTE — Assessment & Plan Note (Signed)
Not checking sugars regularly.  Reported sugars as outlined.  Discussed diet and exercise.  Need to check sugars regularly.  Continue lantus, humalog with meals and ozempic.  Check met b and a1c.

## 2021-01-11 ENCOUNTER — Ambulatory Visit: Payer: No Typology Code available for payment source

## 2021-01-11 ENCOUNTER — Other Ambulatory Visit: Payer: Self-pay

## 2021-01-11 DIAGNOSIS — M6281 Muscle weakness (generalized): Secondary | ICD-10-CM

## 2021-01-11 DIAGNOSIS — R2681 Unsteadiness on feet: Secondary | ICD-10-CM

## 2021-01-11 DIAGNOSIS — R2689 Other abnormalities of gait and mobility: Secondary | ICD-10-CM

## 2021-01-11 DIAGNOSIS — R269 Unspecified abnormalities of gait and mobility: Secondary | ICD-10-CM | POA: Diagnosis not present

## 2021-01-11 NOTE — Therapy (Signed)
[place] MAIN [facility] SERVICES [address] [place], [place], [address] Phone: [phone]   Fax:  [phone]  Physical Therapy Treatment  Patient Details  Name: Gregory Crane MRN: [id number] Date of Birth: [birth year] Referring Provider (PT): [name], MD   Encounter Date: 01/11/2021   PT End of Session - 01/11/21 0913    Visit Number 17    Number of Visits 26    Date for PT Re-Evaluation 02/08/21    Authorization Type [employer] managed;  7/10 PN 4/21    Authorization Time Period Cert 3/66/29-4/76/54    PT Start Time 0852    PT Stop Time 0930    PT Time Calculation (min) 38 min    Equipment Utilized During Treatment Gait belt   iPad; treadmill; yellow TB; blue TB; AFO   Activity Tolerance Patient tolerated treatment well    Behavior During Therapy [facility] for tasks assessed/performed           Past Medical History:  Diagnosis Date  . Allergy   . Anemia   . Bell's palsy   . Diabetes mellitus without complication (HCC)    diet controlled  . Hypertension   . Hypothyroidism   . Kidney stones   . Pseudotumor cerebri   . Stroke [facility])     Past Surgical History:  Procedure Laterality Date  . COLONOSCOPY WITH PROPOFOL N/A 03/30/2020   Procedure: COLONOSCOPY WITH PROPOFOL;  Surgeon: Lesly Rubenstein, MD;  Location: [facility] ENDOSCOPY;  Service: Endoscopy;  Laterality: N/A;  . LOOP RECORDER INSERTION N/A 01/27/2018   Procedure: LOOP RECORDER INSERTION;  Surgeon: [name], MD;  Location: [place] CV LAB;  Service: Cardiovascular;  Laterality: N/A;  . LUMBAR PUNCTURE     as child  . NO PAST SURGERIES    . TEE WITHOUT CARDIOVERSION N/A 01/07/2018   Procedure: TRANSESOPHAGEAL ECHOCARDIOGRAM (TEE);  Surgeon: Minna Merritts, MD;  Location: [facility] ORS;  Service: Cardiovascular;  Laterality: N/A;    There were no vitals filed for this visit.   Subjective Assessment - 01/11/21 0912    Subjective Patient reports he is feeling very  unsteady the past few days. Is late due to being very tired feeling. Reports he is having a bad week.    Pertinent History Referred by PCP for unsteadiness of gait. PMH: anemia, CKD4, IDDM, HTN on amlodipine, hydralazin, losartan, lasix; HLD< hypoTSH, neuropathy with gait disturbance on Lyrica; COVID PNA hospitalization.    Currently in Pain? No/denies            Patient reports he is feeling very unsteady the past few days. Is late due to being very tired feeling.    BP at start of session: 144/53    Treatment:   In hallway with walking with stick: -horizontal head turns scanning and reading cards for letters 2x 86 ft, numbers 1x 86 ft, symbols 1x 86 ft. Frequent cueing for scanning due to missing targets.    standing next to support bar  -lateral step up/down 6" step 10x each side; very challenging for patient requires UE support, very short of breathe by end -step up/down 8x each LE, UE support; very challenged with LLE more than RLE -heel raise 15x with BUE support   -lateral step over blue TB on floor with focus on being on balls of feet rather than flat footed x 15 each side  Seated:  dynadisc press down for eccentric hold 10x 5 second holds each LE dynadisc df/pf  15x each LE; challenging or patient to perform with coordinated movements Dynadisc: circles clockwise 10x/counterclockwise 10x each LE   sit to stand 10x; no UE support. One LOB falling back into chair on 10th  RTB hamstring curl 15x each LE  RTB adduction against PT resistance 12x each LE    Education provided throughout session via VC/TC and demonstration to facilitate movement at target joints and correct muscle activation for all testing and exercises performed.   Patient's session is limited by late arrival and fatigue. Patient is challenged with visual scanning with ambulation with limited ability to dual task frequently missing targets to scan; improved with repetition. He is able to tolerate standing  intervention with seated rest breaks.   The pt will benefit from further skilled therapy to improve endurance, BLE strength and balance                    PT Education - 01/11/21 0913    Education Details exercise technique, body mechanics    Person(s) Educated Patient    Methods Explanation;Demonstration;Tactile cues;Verbal cues    Comprehension Verbalized understanding;Returned demonstration;Verbal cues required;Tactile cues required            PT Short Term Goals - 12/14/20 1450      PT SHORT TERM GOAL #1   Title Pt to demonstrate improved 5xSTS in <15sec without LOB    Baseline 20sec with 3 LOB at eval 4/21: 15.05 seconds with two near LOB    Time 4    Period Weeks    Status Partially Met    Target Date 01/11/21             PT Long Term Goals - 12/14/20 1409      PT LONG TERM GOAL #1   Title Pt to show improved FOTO score to >70.    Baseline FOTO: 59/100 at evaluation 4/21: 56.2%    Time 8    Period Weeks    Status On-going    Target Date 02/08/21      PT LONG TERM GOAL #2   Title Pt to demonstrate 5/5 ankle dorsiflexion bilat.    Baseline 4/5 bilat at eval 4/21:4+/5 left; 4/5 R pf 4+/5 df    Time 8    Period Weeks    Status Partially Met    Target Date 02/08/21      PT LONG TERM GOAL #3   Title Pt to demnonstrate SLS bilat >30sec, shod, AFO as needed    Baseline <3sec at eval 4/21: 4 seconds L 8 seconds R    Time 8    Period Weeks    Status Partially Met    Target Date 02/08/21      PT LONG TERM GOAL #4   Title Patient will increase six minute walk test distance to >1600 for progression to age norm community ambulator and improve gait ability    Baseline 4/21: 1185 ft with walking stick    Time 8    Period Weeks    Status Partially Met    Target Date 02/08/21                 Plan - 01/11/21 0915    Clinical Impression Statement Patient's session is limited by late arrival and fatigue. Patient is challenged with visual  scanning with ambulation with limited ability to dual task frequently missing targets to scan; improved with repetition. He is able to tolerate standing intervention with seated rest breaks.  The pt will benefit from further skilled therapy to improve endurance, BLE strength and balance    Personal Factors and Comorbidities Age;Comorbidity 1;Fitness    Examination-Activity Limitations Locomotion Level;Transfers;Carry;Toileting;Stand;Sit    Examination-Participation Restrictions Cleaning;Meal Prep;Occupation    Stability/Clinical Decision Making Evolving/Moderate complexity    Rehab Potential Good    PT Frequency 2x / week    PT Duration 12 weeks    PT Treatment/Interventions Cryotherapy;Electrical Stimulation;Moist Heat;DME Instruction;Neuromuscular re-education;Therapeutic activities;Therapeutic exercise;Stair training;Gait training;Functional mobility training;Patient/family education;Orthotic Fit/Training    PT Next Visit Plan ankle stability and mobility, dynamic balance    PT Home Exercise Plan no changes this session    Consulted and Agree with Plan of Care Patient           Patient will benefit from skilled therapeutic intervention in order to improve the following deficits and impairments:  Abnormal gait,Decreased coordination,Decreased range of motion,Difficulty walking,Cardiopulmonary status limiting activity,Decreased activity tolerance,Decreased balance,Decreased knowledge of precautions,Decreased knowledge of use of DME,Decreased mobility,Decreased strength,Impaired sensation,Postural dysfunction  Visit Diagnosis: Unsteadiness on feet  Other abnormalities of gait and mobility  Muscle weakness (generalized)     Problem List Patient Active Problem List   Diagnosis Date Noted  . History of colon polyps 10/15/2020  . Postoperative hemorrhage involving digestive system following digestive system procedure 09/19/2020  . Acute renal failure superimposed on stage 4 chronic  kidney disease ([place]) 09/17/2020  . Acute cholecystitis without calculus 09/09/2020  . Type 2 diabetes mellitus, with long-term current use of insulin ([place]) 09/09/2020  . Cryptogenic stroke ([place]) 07/13/2020  . History of loop recorder 07/13/2020  . Weakness 06/18/2020  . History of 2019 novel coronavirus disease (COVID-19) 05/20/2020  . CKD (chronic kidney disease) stage 4, GFR 15-29 ml/min (HCC) 04/04/2020  . Pneumonia due to COVID-19 virus 04/03/2020  . AKI (acute kidney injury) ([place]) 04/03/2020  . Elevated troponin 04/03/2020  . Acquired trigger finger 06/08/2019  . Lymphedema 06/08/2019  . Anemia 04/17/2019  . Swelling of left lower extremity 01/10/2019  . Facial droop 04/09/2018  . Daytime somnolence 03/30/2018  . Carotid stenosis 02/03/2018  . Intracranial vascular stenosis 09/12/2017  . Cough 01/20/2017  . Bell's palsy 11/10/2016  . History of CVA (cerebrovascular accident) 11/10/2016  . 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 History of nephrolithiasis 10/27/2016  . TIA (transient ischemic attack) 10/20/2016  . Near syncope 06/23/2016  . 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 Neuropathy 08/06/2015  . Health care maintenance 08/06/2015  . Essential hypertension 08/06/2015  . Heme positive stool 10/10/2013  . Hypothyroidism 10/10/2013  . Microalbuminuria 10/10/2013  . Hyperlipidemia 10/10/2013  . B12 deficiency 10/10/2013  . Diabetes ([place]) 07/04/2013  . Environmental allergies 07/04/2013   Janna Arch, PT, DPT   01/11/2021, 9:30 AM  [place] MAIN [facility] SERVICES [address] [place], [place], [address] Phone: [phone]   Fax:  [phone]  Name: Gregory Crane MRN: [id number] Date of Birth: 1971/04/03

## 2021-01-12 ENCOUNTER — Ambulatory Visit: Payer: No Typology Code available for payment source | Admitting: Pharmacist

## 2021-01-12 DIAGNOSIS — E039 Hypothyroidism, unspecified: Secondary | ICD-10-CM

## 2021-01-12 DIAGNOSIS — G629 Polyneuropathy, unspecified: Secondary | ICD-10-CM

## 2021-01-12 DIAGNOSIS — E785 Hyperlipidemia, unspecified: Secondary | ICD-10-CM

## 2021-01-12 DIAGNOSIS — I10 Essential (primary) hypertension: Secondary | ICD-10-CM

## 2021-01-12 DIAGNOSIS — Z794 Long term (current) use of insulin: Secondary | ICD-10-CM

## 2021-01-12 DIAGNOSIS — E538 Deficiency of other specified B group vitamins: Secondary | ICD-10-CM

## 2021-01-12 DIAGNOSIS — N183 Chronic kidney disease, stage 3 unspecified: Secondary | ICD-10-CM

## 2021-01-12 DIAGNOSIS — E1122 Type 2 diabetes mellitus with diabetic chronic kidney disease: Secondary | ICD-10-CM

## 2021-01-12 MED ORDER — INSULIN LISPRO 100 UNIT/ML IJ SOLN: INTRAMUSCULAR | 2 refills | Status: DC

## 2021-01-12 MED ORDER — INSULIN GLARGINE 100 UNIT/ML ~~LOC~~ SOLN: 18.0000 [IU] | Freq: Every day | SUBCUTANEOUS | 2 refills | Status: DC

## 2021-01-12 NOTE — Patient Instructions (Signed)
Visit Information  Goals Addressed              This Visit's Progress     Patient Stated   .  Medication Monitoring (pt-stated)        Patient Goals/Self-Care Activities . Over the next 90 days, patient will:  - take medications as prescribed check glucose at least 3 times daily using CGM, document, and provide at future appointments check blood pressure daily, document, and provide at future appointments         Print copy of patient instructions, educational materials, and care plan provided in person.   Plan: Telephone follow up appointment with care management team member scheduled for:  ~ 2 weeks  Catie [name], PharmD, [place], [place] Clinical Pharmacist [employer] [facility] [employer] [phone]

## 2021-01-12 NOTE — Chronic Care Management (AMB) (Signed)
Care Management   Pharmacy Note  01/12/2021 Name: Gregory Crane MRN: [id number] DOB: 17-Jul-1971  Subjective: Gregory Crane is a 50 y.o. year old male who is a primary care patient of [name], MD. The Care Management team was consulted for assistance with care management and care coordination needs.    Engaged with patient by telephone for follow up visit in response to provider referral for pharmacy case management and/or care coordination services.   The patient was given information about Care Management services today including:  1. Care Management services includes personalized support from designated clinical staff supervised by the patient's primary care provider, including individualized plan of care and coordination with other care providers. 2. 24/7 contact phone numbers for assistance for urgent and routine care needs. 3. The patient may stop case management services at any time by phone call to the office staff.  Patient agreed to services and consent obtained.  Assessment:  Review of patient status, including review of consultants reports, laboratory and other test data, was performed as part of comprehensive evaluation and provision of chronic care management services.   SDOH (Social Determinants of Health) assessments and interventions performed:    Objective:  Lab Results  Component Value Date   CREATININE 4.27 (H) 01/08/2021   CREATININE 3.52 (H) 08/15/2020   CREATININE 2.45 (H) 05/16/2020    Lab Results  Component Value Date   HGBA1C 9.9 (H) 01/08/2021       Component Value Date/Time   CHOL 127 01/08/2021 1158   CHOL 109 03/28/2020 1202   TRIG 159.0 (H) 01/08/2021 1158   HDL 28.90 (L) 01/08/2021 1158   HDL 31 (L) 03/28/2020 1202   CHOLHDL 4 01/08/2021 1158   VLDL 31.8 01/08/2021 1158   LDLCALC 66 01/08/2021 1158   LDLCALC 60 03/28/2020 1202   Lab Results  Component Value Date   TSH 22.70 (H) 01/08/2021   Lab Results  Component Value  Date   VITAMINB12 153 (L) 01/08/2021    Clinical ASCVD: Yes  The ASCVD Risk score Mikey Bussing DC Jr., et al., 2013) failed to calculate for the following reasons:   The patient has a prior MI or stroke diagnosis    BP Readings from Last 3 Encounters:  01/08/21 136/76  12/01/20 (!) 182/77  10/31/20 (!) 162/77    Care Plan  No Known Allergies  Medications Reviewed Today    Reviewed by Janna Arch, PT (Physical Therapist) on 01/11/21 at 0856  Med List Status: <None>  Medication Order Taking? Sig Documenting Provider Last Dose Status Informant  Accu-Chek FastClix Lancets MISC [id number] No USE TO CHECK BLOOD SUGAR THREE TIMES DAILY AS DIRECTED Leone Haven, MD Taking Active   amLODipine (NORVASC) 10 MG tablet [id number] No TAKE 1 TABLET(10 MG) BY MOUTH DAILY [name], MD Taking Active   ascorbic acid (VITAMIN C) 500 MG tablet [id number] No Take 1 tablet (500 mg total) by mouth daily. [name], MD Taking Active   aspirin (ASPIRIN LOW DOSE) 81 MG EC tablet [id number] No TAKE 1 TABLET(81 MG) BY MOUTH DAILY [name], MD Taking Active   atorvastatin (LIPITOR) 80 MG tablet [id number] No TAKE 1 TABLET(80 MG) BY MOUTH DAILY [name], MD Taking Active   cholecalciferol (VITAMIN D3) 25 MCG (1000 UNIT) tablet [id number] No Take 1,000 Units by mouth daily. [provider] Taking Active   clopidogrel (PLAVIX) 75 MG tablet [id number] No TAKE 1 TABLET(75 MG) BY MOUTH DAILY [name], MD Taking Active   cyanocobalamin (,  VITAMIN B-12,) 1000 MCG/ML injection [id number] No INJECT 1 ML INTO THE SKIN ONCE A WEEK FOR 3 WEEKS THEN 1ML EVERY 30 DAYS [name], Randell Patient, MD Taking Active   DULoxetine (CYMBALTA) 20 MG capsule [id number] No TAKE 1 CAPSULE(20 MG) BY MOUTH DAILY [name], MD Taking Active   furosemide (LASIX) 20 MG tablet [id number] No TAKE 3 TABLETS(60 MG) BY MOUTH TWICE DAILY [name], MD Taking Active            Med Note De Hollingshead   Fri Oct 06, 2020  3:38 PM) 60 units QAM  glucose blood (FREESTYLE LITE) test strip [id number] No Check blood sugars twice a day (Dx. 250.02) [name], MD Taking Active Spouse/Significant Other  HUMALOG 100 UNIT/ML injection [id number] No INJECT AS DIRECTED PER SLIDING SCALE 70-200: 0 UNITS, 201-250: 1 UNIT, 251-300: 2 UNITS, 301-350: 3 UNITS,>350: 4 UNITS [name], [name], MD Taking Active   hydrALAZINE (APRESOLINE) 50 MG tablet [id number] No TAKE 1 TABLET(50 MG) BY MOUTH THREE TIMES DAILY [name], MD Taking Active   insulin glargine (LANTUS) 100 UNIT/ML injection [id number] No ADMINISTER 5 UNITS UNDER THE SKIN EVERY NIGHT [name], MD Taking Active            Med Note [name], [name]   Fri Dec 22, 2020 10:42 AM) Taking 15 units daily  Insulin Syringe-Needle U-100 (INSULIN SYRINGE .5CC/30GX5/16") 30G X 5/16" 0.5 ML MISC [id number] No USE AS DIRECTED WITH INSULIN  Patient not taking: Reported on 12/22/2020   [name], MD Not Taking Active   levothyroxine (SYNTHROID) 150 MCG tablet [id number] No Take 1 tablet (150 mcg total) by mouth daily before breakfast. [name], MD Taking Active   losartan (COZAAR) 100 MG tablet [id number] No TAKE 1 TABLET BY MOUTH DAILY [name], MD Taking Active   metolazone (ZAROXOLYN) 2.5 MG tablet [id number] No Take 2.5 mg by mouth. Twice weekly [provider] Taking Active   potassium chloride SA (KLOR-CON) 20 MEQ tablet [id number] No Take 20 mEq by mouth. [provider] Taking Active   pregabalin (LYRICA) 100 MG capsule [id number] No TAKE 1 CAPSULE(100 MG) BY MOUTH THREE TIMES DAILY [name], MD Taking Active   Semaglutide, 1 MG/DOSE, (OZEMPIC, 1 MG/DOSE,) 4 MG/3ML SOPN [id number] No Inject 0.75 mLs (1 mg total) into the skin once a week. [name], MD Taking Active   Syringe/Needle, Disp, (SYRINGE 3CC/25GX1") 25G X 1" 3 ML MISC [id number] No Use as directed to administer b12 injections. [name], MD Taking  Active   zinc sulfate 220 (50 Zn) MG capsule [id number] No Take 1 capsule (220 mg total) by mouth daily. [name], MD Taking Active           Patient Active Problem List   Diagnosis Date Noted  . History of colon polyps 10/15/2020  . Postoperative hemorrhage involving digestive system following digestive system procedure 09/19/2020  . Acute renal failure superimposed on stage 4 chronic kidney disease ([place]) 09/17/2020  . Acute cholecystitis without calculus 09/09/2020  . Type 2 diabetes mellitus, with long-term current use of insulin ([place]) 09/09/2020  . Cryptogenic stroke ([place]) 07/13/2020  . History of loop recorder 07/13/2020  . Weakness 06/18/2020  . History of 2019 novel coronavirus disease (COVID-19) 05/20/2020  . CKD (chronic kidney disease) stage 4, GFR 15-29 ml/min (HCC) 04/04/2020  . Pneumonia due to COVID-19 virus 04/03/2020  . AKI (acute kidney injury) ([place]) 04/03/2020  . Elevated troponin 04/03/2020  . Acquired trigger finger 06/08/2019  .  Lymphedema 06/08/2019  . Anemia 04/17/2019  . Swelling of left lower extremity 01/10/2019  . Facial droop 04/09/2018  . Daytime somnolence 03/30/2018  . Carotid stenosis 02/03/2018  . Intracranial vascular stenosis 09/12/2017  . Cough 01/20/2017  . Bell's palsy 11/10/2016  . History of CVA (cerebrovascular accident) 11/10/2016  . 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 History of nephrolithiasis 10/27/2016  . TIA (transient ischemic attack) 10/20/2016  . Near syncope 06/23/2016  . 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 Neuropathy 08/06/2015  . Health care maintenance 08/06/2015  . Essential hypertension 08/06/2015  . Heme positive stool 10/10/2013  . Hypothyroidism 10/10/2013  . Microalbuminuria 10/10/2013  . Hyperlipidemia 10/10/2013  . B12 deficiency 10/10/2013  . Diabetes ([place]) 07/04/2013  . Environmental allergies 07/04/2013    Conditions to be addressed/monitored: HTN, HLD, DMII  and CKD  Care Plan : Medication Management  Updates made by De Hollingshead, RPH-CPP since 01/12/2021 12:00 AM    Problem: Diabetes, CKD, chronic pain, HTN     Long-Range Goal: Disease Progression Prevention   Start Date: 10/06/2020  This Visit's Progress: On track  Recent Progress: On track  Priority: High  Note:   Current Barriers:  . Unable to independently monitor therapeutic efficacy . Suboptimal therapeutic regimen for diabetes, HTN  Pharmacist Clinical Goal(s):  Marland Kitchen Over the next 90 days, patient will achieve adherence to monitoring guidelines and medication adherence to achieve therapeutic efficacy through collaboration with PharmD and provider.   Interventions: . 1:1 collaboration with [name], MD regarding development and update of comprehensive plan of care as evidenced by provider attestation and co-signature . Inter-disciplinary care team collaboration (see longitudinal plan of care) . Comprehensive medication review performed; medication list updated in electronic medical record  Health Maintenance: . Due for diabetic eye exam. Discussed with patient previously. Encouraged to schedule  Diabetes: . Uncontrolled; current treatment: Ozempic 1 mg weekly, Lantus 15 units QPM, Humalog 4-8 units with meals per sliding scale o Renal fx limits use of metformin, SGLT2 . Discussed resuming use of CGM. Patient notes he has 1 sensor at home that he will place. Will also prepare samples for him to aid in more intensive glycemic review over the next few weeks.  . Increase Lantus to 18 units daily. Increase Humalog sliding scale (If premeal sugar is <130, inject 2 units of Humalog, If premeal sugar is 131-180, inject 4 units of Humalog, If premeal sugar is 181-240, inject 6 units of Humalog, If premeal sugar is 241-300, inject 8 units of Humalog, If premeal sugar is >300, inject 10 units of Humalog). Sent via MyChart for patient review. He verbalized understanding. Scheduled f/u  in 2 weeks.   Hypertension complicated by CKD: . Uncontrolled; current treatment: amlodipine 10 mg QAM, losartan 100 mg QAM, hydralazine 50 mg TID, furosemide 60 mg QAM, metolazone 2.5 mg two days weekly . Did not discuss today. Will circle back moving forward. Follow nephrology recommendations.   Hyperlipidemia and ASCVD risk reduction: . Controlled; current treatment: atorvastatin 80 mg daily . Antiplatelet regimen: aspirin 81 mg daily, clopidogrel 75 mg daily  . Previously recommended to continue current regimen at this time, along with collaboration with cardiology.   Depression/Anxiety with neuropathy . Moderately well managed; current treatment: duloxetine 20 mg daily, pregabalin 100 mg TID  . Previously recommended to continue current regimen at this time.  Hypothyroidism: . Uncontrolled; current regimen: levothyroxine 150 mcg daily . PCP plan to increase to 175 mcg daily. Will follow.  Supplements: Marland Kitchen Vitamin D, Vitamin C, zinc, vitamin B12 injections, ferrous sulfate 325 mg daily  Patient Goals/Self-Care Activities . Over the next 90 days, patient will:  - take medications as prescribed check glucose at least 3 times daily using CGM, document, and provide at future appointments check blood pressure daily, document, and provide at future appointments  Follow Up Plan: Telephone follow up appointment with care management team member scheduled for: ~ 2 weeks      Medication Assistance:  None required.  Patient affirms current coverage meets needs.  Follow Up:  Patient agrees to Care Plan and Follow-up.  Plan: Telephone follow up appointment with care management team member scheduled for:  ~ 2 weeks  Catie [name], PharmD, [place], [place] Clinical Pharmacist [employer] at [employer] [phone]

## 2021-01-14 ENCOUNTER — Other Ambulatory Visit: Payer: Self-pay | Admitting: Internal Medicine

## 2021-01-16 ENCOUNTER — Ambulatory Visit: Payer: No Typology Code available for payment source

## 2021-01-18 ENCOUNTER — Ambulatory Visit: Payer: No Typology Code available for payment source

## 2021-01-18 NOTE — Progress Notes (Signed)
[facility] Summary Report / Loop Recorder 

## 2021-01-19 ENCOUNTER — Telehealth: Payer: Self-pay

## 2021-01-19 NOTE — Telephone Encounter (Signed)
Pt was admitted to [facility] on 01/16/21 and they state that he will need a new order for physical therapy. He is not sure why he needs a NEW one. He thought he had ten more weeks left with current order. They advised him to call his primary. Please advise

## 2021-01-22 ENCOUNTER — Other Ambulatory Visit: Payer: Self-pay | Admitting: Internal Medicine

## 2021-01-22 DIAGNOSIS — Z8673 Personal history of transient ischemic attack (TIA), and cerebral infarction without residual deficits: Secondary | ICD-10-CM

## 2021-01-23 ENCOUNTER — Ambulatory Visit: Payer: No Typology Code available for payment source

## 2021-01-23 NOTE — Telephone Encounter (Signed)
Per patient, PT is the one who recommended new order. Will call and confirm with him what is needed

## 2021-01-23 NOTE — Telephone Encounter (Signed)
If he is currently receiving PT, need to confirm with them if new order is needed.  Usually they just pick up after discharge.

## 2021-01-23 NOTE — Telephone Encounter (Signed)
The discharge summary from the hospital recommended out patient PT but did not note anything about ordering. Are you ok to order PT?

## 2021-01-25 ENCOUNTER — Other Ambulatory Visit: Payer: Self-pay

## 2021-01-25 ENCOUNTER — Ambulatory Visit (INDEPENDENT_AMBULATORY_CARE_PROVIDER_SITE_OTHER): Payer: No Typology Code available for payment source

## 2021-01-25 ENCOUNTER — Encounter: Payer: Self-pay | Admitting: Podiatry

## 2021-01-25 ENCOUNTER — Ambulatory Visit (INDEPENDENT_AMBULATORY_CARE_PROVIDER_SITE_OTHER): Payer: Commercial Managed Care - HMO | Admitting: Podiatry

## 2021-01-25 DIAGNOSIS — M79674 Pain in right toe(s): Secondary | ICD-10-CM

## 2021-01-25 DIAGNOSIS — N179 Acute kidney failure, unspecified: Secondary | ICD-10-CM | POA: Diagnosis not present

## 2021-01-25 DIAGNOSIS — I639 Cerebral infarction, unspecified: Secondary | ICD-10-CM

## 2021-01-25 DIAGNOSIS — B351 Tinea unguium: Secondary | ICD-10-CM

## 2021-01-25 DIAGNOSIS — E0843 Diabetes mellitus due to underlying condition with diabetic autonomic (poly)neuropathy: Secondary | ICD-10-CM

## 2021-01-25 DIAGNOSIS — T148XXA Other injury of unspecified body region, initial encounter: Secondary | ICD-10-CM | POA: Insufficient documentation

## 2021-01-25 DIAGNOSIS — I890 Lymphedema, not elsewhere classified: Secondary | ICD-10-CM | POA: Diagnosis not present

## 2021-01-25 DIAGNOSIS — N184 Chronic kidney disease, stage 4 (severe): Secondary | ICD-10-CM | POA: Diagnosis not present

## 2021-01-25 DIAGNOSIS — G629 Polyneuropathy, unspecified: Secondary | ICD-10-CM

## 2021-01-25 DIAGNOSIS — M79675 Pain in left toe(s): Secondary | ICD-10-CM

## 2021-01-25 LAB — CUP PACEART REMOTE DEVICE CHECK
Date Time Interrogation Session: 20220602031542
Implantable Pulse Generator Implant Date: 20190604

## 2021-01-26 ENCOUNTER — Encounter: Payer: Self-pay | Admitting: Podiatry

## 2021-01-26 ENCOUNTER — Telehealth: Payer: No Typology Code available for payment source

## 2021-01-26 ENCOUNTER — Telehealth: Payer: Self-pay | Admitting: Pharmacist

## 2021-01-26 NOTE — Progress Notes (Signed)
This patient returns to my office for at risk foot care.  This patient requires this care by a professional since this patient will be at risk due to having neuropathy,  CKD and diabetes.  This patient is unable to cut nails himself since the patient cannot reach his nails.These nails are painful walking and wearing shoes.  This patient presents for at risk foot care today.  General Appearance  Alert, conversant and in no acute stress.  Vascular  Dorsalis pedis and posterior tibial  pulses are palpable  bilaterally.  Capillary return is within normal limits  bilaterally. Temperature is within normal limits  bilaterally.  Neurologic  Senn-Weinstein monofilament wire test within normal limits  bilaterally. Muscle power within normal limits bilaterally.  Nails Thick disfigured discolored nails with subungual debris  from hallux to fifth toes bilaterally. No evidence of bacterial infection or drainage bilaterally.  Orthopedic  No limitations of motion  feet .  No crepitus or effusions noted.  No bony pathology or digital deformities noted.  Skin  normotropic skin with no porokeratosis noted bilaterally.  No signs of infections or ulcers noted.  Multiple fissure open wounds on back of right heel.  No infection or drainage.   Onychomycosis  Pain in right toes  Pain in left toes  Open wounds right heel.  Consent was obtained for treatment procedures.   Mechanical debridement of nails 1-5  bilaterally performed with a nail nipper.  Filed with dremel without incident. ZI was concerned about his open wounds.  He was at [facility] and was told they are doing well and sent him home.  I saw these open wounds/fissures and was concerned based on his medical history. I consulted with Dr.  Posey Pronto who agreed with me the open wounds were healing.  I proceeded to bandage with betadine bandages since these wounds were open and I was concerned they may become infected.  Told him to perform betadine bandages at home until the  wounds were healed.  Call if problem persists.   Return office visit 3 months for preventative foot care services.                   Told patient to return for periodic foot care and evaluation due to potential at risk complications.   Gardiner Barefoot DPM

## 2021-01-26 NOTE — Telephone Encounter (Signed)
  Chronic Care Management   Note  01/26/2021 Name: Gregory Crane MRN: [id number] DOB: 08-28-1970   Attempted to contact patient for scheduled appointment for medication management support. Patient had someone over and couldn't talk. Requested to reschedule. Denies any confusion regarding medication changes made at hospital discharge.   Advised that he needs to call nephrology and cardiology for hospital follow up ASAP. He verbalizes understanding.   Scheduled f/u call in my next available slot on Tuesday.   Catie [name], PharmD, [place], [place] Clinical Pharmacist [employer] at [place]

## 2021-01-29 NOTE — Telephone Encounter (Signed)
Spoke with [facility]  PT. They do require new order after being hospitalized to confirm patient is getting appropriate care. Advised for patient to resume PT but they need order faxed. They will be sending over order for signature.

## 2021-01-30 ENCOUNTER — Telehealth: Payer: Self-pay | Admitting: Internal Medicine

## 2021-01-30 ENCOUNTER — Ambulatory Visit: Payer: No Typology Code available for payment source | Admitting: Pharmacist

## 2021-01-30 DIAGNOSIS — N183 Chronic kidney disease, stage 3 unspecified: Secondary | ICD-10-CM

## 2021-01-30 DIAGNOSIS — G629 Polyneuropathy, unspecified: Secondary | ICD-10-CM

## 2021-01-30 DIAGNOSIS — I10 Essential (primary) hypertension: Secondary | ICD-10-CM

## 2021-01-30 DIAGNOSIS — Z794 Long term (current) use of insulin: Secondary | ICD-10-CM

## 2021-01-30 DIAGNOSIS — E039 Hypothyroidism, unspecified: Secondary | ICD-10-CM

## 2021-01-30 DIAGNOSIS — E785 Hyperlipidemia, unspecified: Secondary | ICD-10-CM

## 2021-01-30 MED ORDER — FREESTYLE LIBRE 2 SENSOR MISC: 11 refills | Status: DC

## 2021-01-30 NOTE — Patient Instructions (Signed)
Visit Information  Goals Addressed              This Visit's Progress     Patient Stated   .  Medication Monitoring (pt-stated)        Patient Goals/Self-Care Activities . Over the next 90 days, patient will:  - take medications as prescribed check glucose at least 3 times daily using CGM, document, and provide at future appointments check blood pressure daily, document, and provide at future appointments        Patient verbalizes understanding of instructions provided today and agrees to view in [place].   Plan: Telephone follow up appointment with care management team member scheduled for:  ~ 4 weeks  Catie [name], PharmD, [place], [place] Clinical Pharmacist [employer] at [employer] [phone]

## 2021-01-30 NOTE — Telephone Encounter (Signed)
I received notification from Catie that Gregory Crane had not increased synthroid.  TSH elevated.  Per note, he is currently taking 128mcg q day.  Please confirm. If correct, then increase to 128mcg q day.  Will need tsh checked in 6 weeks.

## 2021-01-30 NOTE — Chronic Care Management (AMB) (Signed)
Care Management   Pharmacy Note  01/30/2021 Name: Gregory Crane MRN: [id number] DOB: 11/18/1970  Subjective: Gregory Crane is a 50 y.o. year old male who is a primary care patient of [name], MD. The Care Management team was consulted for assistance with care management and care coordination needs.    Engaged with patient by telephone for follow up visit in response to provider referral for pharmacy case management and/or care coordination services.   The patient was given information about Care Management services today including:  1. Care Management services includes personalized support from designated clinical staff supervised by the patient's primary care provider, including individualized plan of care and coordination with other care providers. 2. 24/7 contact phone numbers for assistance for urgent and routine care needs. 3. The patient may stop case management services at any time by phone call to the office staff.  Patient agreed to services and consent obtained.  Assessment:  Review of patient status, including review of consultants reports, laboratory and other test data, was performed as part of comprehensive evaluation and provision of chronic care management services.   SDOH (Social Determinants of Health) assessments and interventions performed:  SDOH Interventions   Flowsheet Row Most Recent Value  SDOH Interventions   Financial Strain Interventions Intervention Not Indicated       Objective:  Lab Results  Component Value Date   CREATININE 4.27 (H) 01/08/2021   CREATININE 3.52 (H) 08/15/2020   CREATININE 2.45 (H) 05/16/2020    Lab Results  Component Value Date   HGBA1C 9.9 (H) 01/08/2021       Component Value Date/Time   CHOL 127 01/08/2021 1158   CHOL 109 03/28/2020 1202   TRIG 159.0 (H) 01/08/2021 1158   HDL 28.90 (L) 01/08/2021 1158   HDL 31 (L) 03/28/2020 1202   CHOLHDL 4 01/08/2021 1158   VLDL 31.8 01/08/2021 1158   LDLCALC 66  01/08/2021 1158   LDLCALC 60 03/28/2020 1202     Clinical ASCVD: Yes  The ASCVD Risk score (Goff DC Jr., et al., 2013) failed to calculate for the following reasons:   The patient has a prior MI or stroke diagnosis      BP Readings from Last 3 Encounters:  01/08/21 136/76  12/01/20 (!) 182/77  10/31/20 (!) 162/77    Care Plan  No Known Allergies  Medications Reviewed Today    Reviewed by De Hollingshead, RPH-CPP (Pharmacist) on 01/30/21 at 1433  Med List Status: <None>  Medication Order Taking? Sig Documenting Provider Last Dose Status Informant  Accu-Chek FastClix Lancets MISC [id number]  USE TO CHECK BLOOD SUGAR THREE TIMES DAILY AS DIRECTED Leone Haven, MD  Active   amLODipine (NORVASC) 10 MG tablet [id number] Yes TAKE 1 TABLET(10 MG) BY MOUTH DAILY [name], MD Taking Active   ascorbic acid (VITAMIN C) 500 MG tablet [id number] No Take 1 tablet (500 mg total) by mouth daily.  Patient not taking: Reported on 01/30/2021   [name], MD Not Taking Active   aspirin (ASPIRIN LOW DOSE) 81 MG EC tablet [id number] Yes TAKE 1 TABLET(81 MG) BY MOUTH DAILY [name], MD Taking Active   atorvastatin (LIPITOR) 80 MG tablet [id number] Yes TAKE 1 TABLET(80 MG) BY MOUTH DAILY [name], MD Taking Active   cholecalciferol (VITAMIN D3) 25 MCG (1000 UNIT) tablet [id number] No Take 1,000 Units by mouth daily.  Patient not taking: Reported on 01/30/2021   [provider] Not Taking Active   clopidogrel (PLAVIX) 75  MG tablet [id number] Yes TAKE 1 TABLET(75 MG) BY MOUTH DAILY [name], MD Taking Active   cyanocobalamin (,VITAMIN B-12,) 1000 MCG/ML injection [id number] Yes INJECT 1 ML INTO THE SKIN ONCE A WEEK FOR 3 WEEKS THEN 1ML EVERY 30 DAYS [name], MD Taking Active        Patient not taking:      Discontinued 01/30/21 1433 (Patient Preference)   furosemide (LASIX) 20 MG tablet [id number] Yes TAKE 3 TABLETS(60 MG) BY MOUTH TWICE DAILY  Patient  taking differently: Take 80 mg by mouth daily.   [name], MD Taking Active            Med Note ([place] Jan 30, 2021  2:27 PM)    glucose blood (FREESTYLE LITE) test strip [id number] Yes Check blood sugars twice a day (Dx. 250.02) [name], MD Taking Active Spouse/Significant Other  insulin glargine (LANTUS) 100 UNIT/ML injection [id number] Yes Inject 0.18 mLs (18 Units total) into the skin daily. [name], MD Taking Active            Med Note [name], [name]   Tue Jan 30, 2021  2:28 PM) Taking 15 units QPM  insulin lispro (HUMALOG) 100 UNIT/ML injection [id number] Yes Inject 2-10 units three times daily before meals per sliding scale: If premeal sugar is <130, inject 2 units; if 131-180, inject 4 units; if 181-240, inject 6 units; if 241-300, inject 8 units, if  >300, inject 10 units [name], MD Taking Active   Insulin Syringe-Needle U-100 (INSULIN SYRINGE .5CC/30GX5/16") 30G X 5/16" 0.5 ML MISC [id number] Yes USE AS DIRECTED WITH INSULIN [name], MD Taking Active   levothyroxine (SYNTHROID) 150 MCG tablet [id number] Yes Take 1 tablet (150 mcg total) by mouth daily before breakfast. [name], MD Taking Active   metolazone (ZAROXOLYN) 2.5 MG tablet [id number] No Take 2.5 mg by mouth. Twice weekly  Patient not taking: Reported on 01/30/2021   [provider] Not Taking Active   Multiple Vitamin (MULTIVITAMIN) tablet [id number] Yes Take 1 tablet by mouth daily. [provider] Taking Active   pregabalin (LYRICA) 100 MG capsule [id number] Yes TAKE 1 CAPSULE(100 MG) BY MOUTH THREE TIMES DAILY [name], MD Taking Active            Med Note ([place] Jan 30, 2021  2:31 PM) Taking 1 capsule twice daily  Semaglutide, 1 MG/DOSE, (OZEMPIC, 1 MG/DOSE,) 4 MG/3ML SOPN [id number] Yes Inject 0.75 mLs (1 mg total) into the skin once a week. [name], MD Taking Active   Syringe/Needle, Disp, (SYRINGE 3CC/25GX1")  25G X 1" 3 ML MISC [id number] Yes Use as directed to administer b12 injections. [name], MD Taking Active   zinc sulfate 220 (50 Zn) MG capsule [id number] No Take 1 capsule (220 mg total) by mouth daily.  Patient not taking: Reported on 01/30/2021   [name], MD Not Taking Active           Patient Active Problem List   Diagnosis Date Noted  . Open wound 01/25/2021  . History of colon polyps 10/15/2020  . Postoperative hemorrhage involving digestive system following digestive system procedure 09/19/2020  . Acute renal failure superimposed on stage 4 chronic kidney disease ([place]) 09/17/2020  . Acute cholecystitis without calculus 09/09/2020  . Type 2 diabetes mellitus, with long-term current use of insulin ([place]) 09/09/2020  . Cryptogenic stroke ([place]) 07/13/2020  . History of loop recorder 07/13/2020  .  Weakness 06/18/2020  . History of 2019 novel coronavirus disease (COVID-19) 05/20/2020  . CKD (chronic kidney disease) stage 4, GFR 15-29 ml/min (HCC) 04/04/2020  . Pneumonia due to COVID-19 virus 04/03/2020  . AKI (acute kidney injury) ([place]) 04/03/2020  . Elevated troponin 04/03/2020  . Acquired trigger finger 06/08/2019  . Lymphedema 06/08/2019  . Anemia 04/17/2019  . Swelling of left lower extremity 01/10/2019  . Facial droop 04/09/2018  . Daytime somnolence 03/30/2018  . Carotid stenosis 02/03/2018  . Intracranial vascular stenosis 09/12/2017  . Cough 01/20/2017  . Bell's palsy 11/10/2016  . History of CVA (cerebrovascular accident) 11/10/2016  . 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 History of nephrolithiasis 10/27/2016  . TIA (transient ischemic attack) 10/20/2016  . Near syncope 06/23/2016  . 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 Neuropathy 08/06/2015  . Health care maintenance 08/06/2015  . Essential hypertension 08/06/2015  . Heme positive stool 10/10/2013  . Hypothyroidism 10/10/2013  . Microalbuminuria 10/10/2013  .  Hyperlipidemia 10/10/2013  . B12 deficiency 10/10/2013  . Diabetes ([place]) 07/04/2013  . Environmental allergies 07/04/2013    Conditions to be addressed/monitored: CHF, CAD, HTN, HLD, DMII and CKD  Care Plan : Medication Management  Updates made by De Hollingshead, RPH-CPP since 01/30/2021 12:00 AM    Problem: Diabetes, CKD, chronic pain, HTN     Long-Range Goal: Disease Progression Prevention   Start Date: 10/06/2020  This Visit's Progress: On track  Recent Progress: On track  Priority: High  Note:   Current Barriers:  . Unable to independently monitor therapeutic efficacy . Suboptimal therapeutic regimen for diabetes, HTN  Pharmacist Clinical Goal(s):  Marland Kitchen Over the next 90 days, patient will achieve adherence to monitoring guidelines and medication adherence to achieve therapeutic efficacy through collaboration with PharmD and provider.   Interventions: . 1:1 collaboration with [name], MD regarding development and update of comprehensive plan of care as evidenced by provider attestation and co-signature . Inter-disciplinary care team collaboration (see longitudinal plan of care) . Comprehensive medication review performed; medication list updated in electronic medical record   Health Maintenance: . Due for diabetic eye exam. Discussed with patient previously. Encouraged to schedule. Will remind moving forward . Hospitalization at [facility] 5/24-5/26.   Diabetes: . Uncontrolled; current treatment: Ozempic 1 mg weekly, Lantus 15 units QPM, Humalog 4-10 units with meals per sliding scale (patient says he gauges based on what he's eating, no formal regimen) o Renal fx limits use of metformin, SGLT2 . Discussed resuming use of CGM. He thought he had another sensor at home but didn't. Will prepare sensor sample and send updated script to the pharmacy.  . Current glucose readings: fastings: 160-180s; pre-dinner readings: ~130s . Denies episodes of hypoglycemia or symptoms.   . Current meal patterns: midmorning/lunch meal: salad if feeling up to making; frozen meals; sometimes chicken wings as take out, occasionally;  . Discussed lower carbohydrate, lower sodium options for meals. Offered RN CM referral for more disease specific education, patient declines.  . Increase Lantus to 18 units given elevated fastings. Discussed that he will likely need to reduce dose of Humalog with his meal given increase in basal insulin. He verbalized understanding.  . Contact the office with episodes of hypoglycemia.   Hypertension complicated by CKD: . Uncontrolled; current treatment: amlodipine 10 mg QAM, furosemide 60 mg QAM, metolazone 2.5 mg - reduced to PRN at hospital discharge . Losartan, hydralazine stopped at hospital discharge.  . Home BP readings: has not checked lately,  .  Advised him to call nephrology back this afternoon for hospital f/u. He verbalized understanding.  . Encouraged home BP checks.   Hyperlipidemia and ASCVD risk reduction: . Controlled per last lipid panel; current treatment: atorvastatin 80 mg daily . Antiplatelet regimen: aspirin 81 mg daily, clopidogrel 75 mg daily  . Previously recommended to continue current regimen at this time, along with collaboration with cardiology.   Depression/Anxiety with neuropathy . Moderately well managed; current treatment: duloxetine 20 mg daily- notes he is not taking, pregabalin 100 mg BID - reduced to once daily given kidney function at hospital discharge, but patient reports he increased back to 100 mg BID once home . Discussed renal clearance of pregabalin. Package labeling recommends limiting to 75 mg once daily if eGFR <15. Discussed with patient to monitor for sedation, lower extremity edema. He notes that the 100 mg daily dose that he received in the hospital was insufficient for pain reduction. Continue current regimen with monitoring for unacceptable side effects.  . Follow up with PCP moving forward.    Hypothyroidism: . Uncontrolled, patient does report fatigue throughout the day; current regimen: levothyroxine 150 mcg daily . PCP planned to increase to 175 mcg daily given last TSH. TSH in hospital was also elevated. They noted they were increasing dose, but continued 150 mcg. Recommend increasing to 175 mcg daily. Will discuss w/ PCP. Recheck TSH 6-8 weeks after.   Supplements: Marland Kitchen Vitamin D, Vitamin C, zinc, vitamin B12 injections, ferrous sulfate 325 mg daily - notes he is not taking Vit D, Vit C, iron lately as he ran out. Plans to purchase.   Patient Goals/Self-Care Activities . Over the next 90 days, patient will:  - take medications as prescribed check glucose at least 3 times daily using CGM, document, and provide at future appointments check blood pressure daily, document, and provide at future appointments  Follow Up Plan: Telephone follow up appointment with care management team member scheduled for: ~ 4 weeks      Medication Assistance:  None required.  Patient affirms current coverage meets needs.  Follow Up:  Patient agrees to Care Plan and Follow-up.  Plan: Telephone follow up appointment with care management team member scheduled for:  ~ 4 weeks  Catie [name], PharmD, [employer], [place] Clinical Pharmacist [employer] at [employer] [phone]

## 2021-01-31 ENCOUNTER — Other Ambulatory Visit: Payer: Self-pay

## 2021-01-31 MED ORDER — LEVOTHYROXINE SODIUM 175 MCG PO TABS: 175.0000 ug | ORAL_TABLET | Freq: Every day | ORAL | 0 refills | Status: DC

## 2021-01-31 NOTE — Telephone Encounter (Signed)
Confirmed he is taking levothyroxine 150. I have sent in levothyroxine 175 mcg. He is coming in for f/u 7/22. He will recheck TSH then. Noted on schedule to do so.

## 2021-01-31 NOTE — Telephone Encounter (Signed)
LMTCB

## 2021-02-14 ENCOUNTER — Other Ambulatory Visit: Payer: Self-pay | Admitting: Internal Medicine

## 2021-02-14 DIAGNOSIS — R2681 Unsteadiness on feet: Secondary | ICD-10-CM

## 2021-02-14 DIAGNOSIS — R2689 Other abnormalities of gait and mobility: Secondary | ICD-10-CM

## 2021-02-14 DIAGNOSIS — M6281 Muscle weakness (generalized): Secondary | ICD-10-CM

## 2021-02-14 NOTE — Therapy (Signed)
[place] MA[facility] SERVICES [address] [place], [place], [address] Phone: [phone]   Fax:  [phone]  February 14, 2021   No Recipients  Physical Therapy Discharge Summary  Patient: Gregory Crane  MRN: [id number]  Date of Birth: [birth year]   Diagnosis: Unsteadiness on feet  Other abnormalities of gait and mobility  Muscle weakness (generalized) Referring Provider (PT): [name], MD   The above patient had been seen in Physical Therapy 17 times of 26 treatments scheduled with 1 no shows and 4 cancellations.  The treatment consisted of therex and neuro re-ed  The patient is:  self discharged, unable to test goals due to self discharge and recent hospitalization.   Subjective: Patient has self discharged. Has not been seen in > 1 month. Can see that patient has been admitted to hospital.   Discharge Findings: Unable to assess goals due to patient not seen for discharge.   Functional Status at Discharge: Unable to assess goals due to patient not seen for discharge.   Goals Partially Met    Sincerely,  Janna Arch, PT, DPT   CC No Recipients  [place] [facility] SERVICES [address] [place], [place], [address] Phone: [phone]   Fax:  [phone]  Patient: Gregory Crane  MRN: [id number]  Date of Birth: 05-09-1971

## 2021-02-16 NOTE — Progress Notes (Signed)
[facility] Summary Report / Loop Recorder 

## 2021-02-19 ENCOUNTER — Encounter: Payer: Self-pay | Admitting: Internal Medicine

## 2021-02-19 ENCOUNTER — Other Ambulatory Visit: Payer: Self-pay | Admitting: Internal Medicine

## 2021-02-20 ENCOUNTER — Ambulatory Visit: Payer: No Typology Code available for payment source | Admitting: Pharmacist

## 2021-02-20 ENCOUNTER — Other Ambulatory Visit: Payer: Self-pay

## 2021-02-20 DIAGNOSIS — E785 Hyperlipidemia, unspecified: Secondary | ICD-10-CM

## 2021-02-20 DIAGNOSIS — Z794 Long term (current) use of insulin: Secondary | ICD-10-CM

## 2021-02-20 DIAGNOSIS — G629 Polyneuropathy, unspecified: Secondary | ICD-10-CM

## 2021-02-20 DIAGNOSIS — I10 Essential (primary) hypertension: Secondary | ICD-10-CM

## 2021-02-20 DIAGNOSIS — E039 Hypothyroidism, unspecified: Secondary | ICD-10-CM

## 2021-02-20 MED ORDER — INSULIN GLARGINE 100 UNIT/ML ~~LOC~~ SOLN: 13.0000 [IU] | Freq: Every day | SUBCUTANEOUS | 2 refills | Status: DC

## 2021-02-20 MED ORDER — INSULIN LISPRO 100 UNIT/ML IJ SOLN: INTRAMUSCULAR | 3 refills | Status: DC

## 2021-02-20 MED ORDER — PREGABALIN 100 MG PO CAPS: 100.0000 mg | ORAL_CAPSULE | Freq: Three times a day (TID) | ORAL | 0 refills | Status: DC

## 2021-02-20 NOTE — Patient Instructions (Signed)
Visit Information   Goals Addressed               This Visit's Progress     Patient Stated     Medication Monitoring (pt-stated)        Patient Goals/Self-Care Activities Over the next 90 days, patient will:  - take medications as prescribed check glucose at least 3 times daily using CGM, document, and provide at future appointments check blood pressure daily, document, and provide at future appointments         Patient verbalizes understanding of instructions provided today and agrees to view in [place].   Plan: Telephone follow up appointment with care management team member scheduled for:  ~ 2 weeks as previously scheduled  Catie [name], PharmD, [place], [place] Clinical Pharmacist [employer] at [facility] [phone]

## 2021-02-20 NOTE — Chronic Care Management (AMB) (Signed)
Care Management   Pharmacy Note  02/20/2021 Name: Gregory Crane MRN: [id number] DOB: October 04, 1970  Subjective: Gregory Crane is a 50 y.o. year old male who is a primary care patient of [name], MD. The Care Management team was consulted for assistance with care management and care coordination needs.    Engaged with patient by telephone for follow up visit in response to provider referral for pharmacy case management and/or care coordination services.   The patient was given information about Care Management services today including:  Care Management services includes personalized support from designated clinical staff supervised by the patient's primary care provider, including individualized plan of care and coordination with other care providers. 24/7 contact phone numbers for assistance for urgent and routine care needs. The patient may stop case management services at any time by phone call to the office staff.  Patient agreed to services and consent obtained.  Assessment:  Review of patient status, including review of consultants reports, laboratory and other test data, was performed as part of comprehensive evaluation and provision of chronic care management services.   SDOH (Social Determinants of Health) assessments and interventions performed:  SDOH Interventions    Flowsheet Row Most Recent Value  SDOH Interventions   Financial Strain Interventions Intervention Not Indicated        Objective:  Lab Results  Component Value Date   CREATININE 4.27 (H) 01/08/2021   CREATININE 3.52 (H) 08/15/2020   CREATININE 2.45 (H) 05/16/2020    Lab Results  Component Value Date   HGBA1C 9.9 (H) 01/08/2021       Component Value Date/Time   CHOL 127 01/08/2021 1158   CHOL 109 03/28/2020 1202   TRIG 159.0 (H) 01/08/2021 1158   HDL 28.90 (L) 01/08/2021 1158   HDL 31 (L) 03/28/2020 1202   CHOLHDL 4 01/08/2021 1158   VLDL 31.8 01/08/2021 1158   LDLCALC 66 01/08/2021  1158   LDLCALC 60 03/28/2020 1202    Clinical ASCVD: Yes  The ASCVD Risk score (Goff DC Jr., et al., 2013) failed to calculate for the following reasons:   The patient has a prior MI or stroke diagnosis      BP Readings from Last 3 Encounters:  01/08/21 136/76  12/01/20 (!) 182/77  10/31/20 (!) 162/77    Care Plan  No Known Allergies  Medications Reviewed Today     Reviewed by De Hollingshead, RPH-CPP (Pharmacist) on 01/30/21 at 1433  Med List Status: <None>   Medication Order Taking? Sig Documenting Provider Last Dose Status Informant  Accu-Chek FastClix Lancets MISC [id number]  USE TO CHECK BLOOD SUGAR THREE TIMES DAILY AS DIRECTED Leone Haven, MD  Active   amLODipine (NORVASC) 10 MG tablet [id number] Yes TAKE 1 TABLET(10 MG) BY MOUTH DAILY [name], MD Taking Active   ascorbic acid (VITAMIN C) 500 MG tablet [id number] No Take 1 tablet (500 mg total) by mouth daily.  Patient not taking: Reported on 01/30/2021   [name], MD Not Taking Active   aspirin (ASPIRIN LOW DOSE) 81 MG EC tablet [id number] Yes TAKE 1 TABLET(81 MG) BY MOUTH DAILY [name], MD Taking Active   atorvastatin (LIPITOR) 80 MG tablet [id number] Yes TAKE 1 TABLET(80 MG) BY MOUTH DAILY [name], MD Taking Active   cholecalciferol (VITAMIN D3) 25 MCG (1000 UNIT) tablet [id number] No Take 1,000 Units by mouth daily.  Patient not taking: Reported on 01/30/2021   [provider] Not Taking Active   clopidogrel (PLAVIX) 75  MG tablet [id number] Yes TAKE 1 TABLET(75 MG) BY MOUTH DAILY [name], MD Taking Active   cyanocobalamin (,VITAMIN B-12,) 1000 MCG/ML injection [id number] Yes INJECT 1 ML INTO THE SKIN ONCE A WEEK FOR 3 WEEKS THEN 1ML EVERY 30 DAYS [name], MD Taking Active   Patient not taking:  Discontinued 01/30/21 1433 (Patient Preference)   furosemide (LASIX) 20 MG tablet [id number] Yes TAKE 3 TABLETS(60 MG) BY MOUTH TWICE DAILY  Patient taking differently: Take  80 mg by mouth daily.   [name], MD Taking Active            Med Note ([place] Jan 30, 2021  2:27 PM)    glucose blood (FREESTYLE LITE) test strip [id number] Yes Check blood sugars twice a day (Dx. 250.02) [name], MD Taking Active Spouse/Significant Other  insulin glargine (LANTUS) 100 UNIT/ML injection [id number] Yes Inject 0.18 mLs (18 Units total) into the skin daily. [name], MD Taking Active            Med Note [name], [name]   Tue Jan 30, 2021  2:28 PM) Taking 15 units QPM  insulin lispro (HUMALOG) 100 UNIT/ML injection [id number] Yes Inject 2-10 units three times daily before meals per sliding scale: If premeal sugar is <130, inject 2 units; if 131-180, inject 4 units; if 181-240, inject 6 units; if 241-300, inject 8 units, if  >300, inject 10 units [name], MD Taking Active   Insulin Syringe-Needle U-100 (INSULIN SYRINGE .5CC/30GX5/16") 30G X 5/16" 0.5 ML MISC [id number] Yes USE AS DIRECTED WITH INSULIN [name], MD Taking Active   levothyroxine (SYNTHROID) 150 MCG tablet [id number] Yes Take 1 tablet (150 mcg total) by mouth daily before breakfast. [name], MD Taking Active   metolazone (ZAROXOLYN) 2.5 MG tablet [id number] No Take 2.5 mg by mouth. Twice weekly  Patient not taking: Reported on 01/30/2021   [provider] Not Taking Active   Multiple Vitamin (MULTIVITAMIN) tablet [id number] Yes Take 1 tablet by mouth daily. [provider] Taking Active   pregabalin (LYRICA) 100 MG capsule [id number] Yes TAKE 1 CAPSULE(100 MG) BY MOUTH THREE TIMES DAILY [name], MD Taking Active            Med Note ([place] Jan 30, 2021  2:31 PM) Taking 1 capsule twice daily  Semaglutide, 1 MG/DOSE, (OZEMPIC, 1 MG/DOSE,) 4 MG/3ML SOPN [id number] Yes Inject 0.75 mLs (1 mg total) into the skin once a week. [name], MD Taking Active   Syringe/Needle, Disp, (SYRINGE 3CC/25GX1") 25G X 1" 3 ML MISC  [id number] Yes Use as directed to administer b12 injections. [name], MD Taking Active   zinc sulfate 220 (50 Zn) MG capsule [id number] No Take 1 capsule (220 mg total) by mouth daily.  Patient not taking: Reported on 01/30/2021   [name], MD Not Taking Active             Patient Active Problem List   Diagnosis Date Noted   Open wound 01/25/2021   History of colon polyps 10/15/2020   Postoperative hemorrhage involving digestive system following digestive system procedure 09/19/2020   Acute renal failure superimposed on stage 4 chronic kidney disease ([place]) 09/17/2020   Acute cholecystitis without calculus 09/09/2020   Type 2 diabetes mellitus, with long-term current use of insulin ([place]) 09/09/2020   Cryptogenic stroke ([place]) 07/13/2020   History of loop recorder 07/13/2020   Weakness 06/18/2020   History of 2019  novel coronavirus disease (COVID-19) 05/20/2020   CKD (chronic kidney disease) stage 4, GFR 15-29 ml/min (HCC) 04/04/2020   Pneumonia due to COVID-19 virus 04/03/2020   AKI (acute kidney injury) ([place]) 04/03/2020   Elevated troponin 04/03/2020   Acquired trigger finger 06/08/2019   Lymphedema 06/08/2019   Anemia 04/17/2019   Swelling of left lower extremity 01/10/2019   Facial droop 04/09/2018   Daytime somnolence 03/30/2018   Carotid stenosis 02/03/2018   Intracranial vascular stenosis 09/12/2017   Cough 01/20/2017   Bell's palsy 11/10/2016   History of CVA (cerebrovascular accident) 11/10/2016   Benign localized hyperplasia of prostate with urinary obstruction 10/27/2016   History of nephrolithiasis 10/27/2016   TIA (transient ischemic attack) 10/20/2016   Near syncope 06/23/2016   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   Neuropathy 08/06/2015   Health care maintenance 08/06/2015   Essential hypertension 08/06/2015   Heme positive stool 10/10/2013   Hypothyroidism 10/10/2013   Microalbuminuria 10/10/2013   Hyperlipidemia 10/10/2013   B12 deficiency 10/10/2013    Diabetes ([place]) 07/04/2013   Environmental allergies 07/04/2013    Conditions to be addressed/monitored: HTN, HLD, DMII, and CKD  Care Plan : Medication Management  Updates made by De Hollingshead, RPH-CPP since 02/20/2021 12:00 AM     Problem: Diabetes, CKD, chronic pain, HTN      Long-Range Goal: Disease Progression Prevention   Start Date: 10/06/2020  This Visit's Progress: On track  Recent Progress: On track  Priority: High  Note:   Current Barriers:  Unable to independently monitor therapeutic efficacy Suboptimal therapeutic regimen for diabetes, HTN  Pharmacist Clinical Goal(s):  Over the next 90 days, patient will achieve adherence to monitoring guidelines and medication adherence to achieve therapeutic efficacy through collaboration with PharmD and provider.   Interventions: 1:1 collaboration with [name], MD regarding development and update of comprehensive plan of care as evidenced by provider attestation and co-signature Inter-disciplinary care team collaboration (see longitudinal plan of care) Comprehensive medication review performed; medication list updated in electronic medical record  Health Maintenance: Due for diabetic eye exam. Discussed with patient previously. Encouraged to schedule. Will remind moving forward  Diabetes: Uncontrolled; current treatment: Ozempic 1 mg weekly, Lantus 10-15 units QPM, Humalog 4-10 units with meals per sliding scale (patient says he gauges based on what he's eating, notes that he tried our formal sliding scale but didn't feel like it was enough) Denies any GI upset (nausea, vomiting, diarrhea). Reminded that if this ever develops and is believed to be resulted to Ozempic therapy, appropriate to discontinue to prevent dehydration that could be hard on his kidneys Renal fx limits use of metformin, SGLT2 Concerned about accuracy of Libre. Reviewed lag time between interstitial tissue and blood glucose. Reviewed improved  accuracy of CGM vs many traditional glucometers Connected to clinic LibreView account. Current glucose readings:  Date of Download: 6/11-6/24/22 % Time CGM is active: 36% (<70% limits interpretability) Average Glucose: 151 mg/dL Glucose Management Indicator: not enough data to interpret  Glucose Variability: 32.4 (goal <36%) Time in Goal:  - Time in range 70-180: 75% - Time above range: 25% - Time below range: 0% Observed patterns: post prandial elevations Denies episodes of hypoglycemia or symptoms.  Discussed pharmacokinetic reasoning for daily consistent dose of basal insulin vs fluctuating doses. Notes he more often uses a dose in between 10 and 15 units of Lantus nightly. Consistently start using Lantus 13 units QPM.  Increase sliding scale.  Inject 4-12 units three times daily before meals per sliding scale: If  premeal sugar is <130, inject 4 units; if 131-180, inject 6 units; if 181-240, inject 8 units; if 241-300, inject 10 units, if >300, inject 12 units. Will send via MyChart Educated on need to scan glucose at least [facility] for full download from reader to his phone to allow review during our appointments. He verbalized understanding  Hypertension complicated by CKD: Uncontrolled; current treatment: amlodipine 10 mg QAM, furosemide 80 mg (per nephrology, script updated today Home BP readings: did not discuss today Recommended to continue current regimen at this time and continue collaboration with nephrology   Hyperlipidemia and ASCVD risk reduction: Controlled per last lipid panel; current treatment: atorvastatin 80 mg daily Antiplatelet regimen: aspirin 81 mg daily, clopidogrel 75 mg daily  Previously recommended to continue current regimen at this time, along with collaboration with cardiology.   Depression/Anxiety with neuropathy Moderately well managed; current treatment: pregabalin 100 mg BID-TID  Continue current regimen at this time along with PCP  collaboration  Hypothyroidism: Uncontrolled but subsequent dose change; current regimen: levothyroxine 175 mcg daily Recommend to continue current regimen at this time. Recheck TSH 6-8 weeks after dose adjustment.   Supplements: Vitamin B12 injections weekly x 3 weeks, about to increase to monthly  Patient Goals/Self-Care Activities Over the next 90 days, patient will:  - take medications as prescribed check glucose at least 3 times daily using CGM, document, and provide at future appointments check blood pressure daily, document, and provide at future appointments  Follow Up Plan: Telephone follow up appointment with care management team member scheduled for: ~ 2 weeks as previously scheduled      Medication Assistance:  None required.  Patient affirms current coverage meets needs.  Follow Up:  Patient agrees to Care Plan and Follow-up.  Plan: Telephone follow up appointment with care management team member scheduled for:  ~ 2 weeks as previously scheduled  Catie [name], PharmD, [place], [place] Clinical Pharmacist [employer] at [facility] [phone]

## 2021-02-27 ENCOUNTER — Ambulatory Visit (INDEPENDENT_AMBULATORY_CARE_PROVIDER_SITE_OTHER): Payer: No Typology Code available for payment source

## 2021-02-27 DIAGNOSIS — I639 Cerebral infarction, unspecified: Secondary | ICD-10-CM

## 2021-02-28 LAB — CUP PACEART REMOTE DEVICE CHECK
Date Time Interrogation Session: 20220705031642
Implantable Pulse Generator Implant Date: 20190604

## 2021-03-06 ENCOUNTER — Telehealth: Payer: No Typology Code available for payment source

## 2021-03-06 ENCOUNTER — Telehealth: Payer: Self-pay | Admitting: Pharmacist

## 2021-03-06 NOTE — Telephone Encounter (Signed)
  Chronic Care Management   Note  03/06/2021 Name: Gregory Crane MRN: [id number] DOB: 02-16-1971   Attempted to contact patient for scheduled appointment for medication management support. He notes that he has been busy this morning taking care of a sick grandchild and request to reschedule. Rescheduled for Friday afternoon.  Catie [name], PharmD, [place], [place] Clinical Pharmacist [employer] at [place]

## 2021-03-09 ENCOUNTER — Ambulatory Visit: Payer: No Typology Code available for payment source | Admitting: Pharmacist

## 2021-03-09 DIAGNOSIS — N184 Chronic kidney disease, stage 4 (severe): Secondary | ICD-10-CM

## 2021-03-09 DIAGNOSIS — I10 Essential (primary) hypertension: Secondary | ICD-10-CM

## 2021-03-09 DIAGNOSIS — Z794 Long term (current) use of insulin: Secondary | ICD-10-CM

## 2021-03-09 DIAGNOSIS — E785 Hyperlipidemia, unspecified: Secondary | ICD-10-CM

## 2021-03-09 NOTE — Patient Instructions (Signed)
Visit Information   Goals Addressed               This Visit's Progress     Patient Stated     Medication Monitoring (pt-stated)        Patient Goals/Self-Care Activities Over the next 90 days, patient will:  - take medications as prescribed check glucose at least 3 times daily using CGM, document, and provide at future appointments check blood pressure daily, document, and provide at future appointments         Patient verbalizes understanding of instructions provided today and agrees to view in [place].   Plan: Telephone follow up appointment with care management team member scheduled for:  ~ 6 weeks  Catie [name], PharmD, [place], [place] Clinical Pharmacist [employer] at [employer] [phone]

## 2021-03-09 NOTE — Chronic Care Management (AMB) (Signed)
Care Management   Pharmacy Note  03/09/2021 Name: Gregory Crane MRN: [id number] DOB: 1971-03-21  Subjective: Gregory Crane is a 50 y.o. year old male who is a primary care patient of [name], MD. The Care Management team was consulted for assistance with care management and care coordination needs.    Engaged with patient by telephone for follow up visit in response to provider referral for pharmacy case management and/or care coordination services.   The patient was given information about Care Management services today including:  Care Management services includes personalized support from designated clinical staff supervised by the patient's primary care provider, including individualized plan of care and coordination with other care providers. 24/7 contact phone numbers for assistance for urgent and routine care needs. The patient may stop case management services at any time by phone call to the office staff.  Patient agreed to services and consent obtained.  Assessment:  Review of patient status, including review of consultants reports, laboratory and other test data, was performed as part of comprehensive evaluation and provision of chronic care management services.   SDOH (Social Determinants of Health) assessments and interventions performed:    Objective:  Lab Results  Component Value Date   CREATININE 4.27 (H) 01/08/2021   CREATININE 3.52 (H) 08/15/2020   CREATININE 2.45 (H) 05/16/2020    Lab Results  Component Value Date   HGBA1C 9.9 (H) 01/08/2021       Component Value Date/Time   CHOL 127 01/08/2021 1158   CHOL 109 03/28/2020 1202   TRIG 159.0 (H) 01/08/2021 1158   HDL 28.90 (L) 01/08/2021 1158   HDL 31 (L) 03/28/2020 1202   CHOLHDL 4 01/08/2021 1158   VLDL 31.8 01/08/2021 1158   LDLCALC 66 01/08/2021 1158   LDLCALC 60 03/28/2020 1202     Clinical ASCVD: Yes  The ASCVD Risk score Mikey Bussing DC Jr., et al., 2013) failed to calculate for the  following reasons:   The patient has a prior MI or stroke diagnosis    Other: (CHADS2VASc if Afib, PHQ9 if depression, MMRC or CAT for COPD, ACT, DEXA)  BP Readings from Last 3 Encounters:  01/08/21 136/76  12/01/20 (!) 182/77  10/31/20 (!) 162/77    Care Plan  No Known Allergies  Medications Reviewed Today     Reviewed by De Hollingshead, RPH-CPP (Pharmacist) on 01/30/21 at 1433  Med List Status: <None>   Medication Order Taking? Sig Documenting Provider Last Dose Status Informant  Accu-Chek FastClix Lancets MISC [id number]  USE TO CHECK BLOOD SUGAR THREE TIMES DAILY AS DIRECTED Leone Haven, MD  Active   amLODipine (NORVASC) 10 MG tablet [id number] Yes TAKE 1 TABLET(10 MG) BY MOUTH DAILY [name], MD Taking Active   ascorbic acid (VITAMIN C) 500 MG tablet [id number] No Take 1 tablet (500 mg total) by mouth daily.  Patient not taking: Reported on 01/30/2021   [name], MD Not Taking Active   aspirin (ASPIRIN LOW DOSE) 81 MG EC tablet [id number] Yes TAKE 1 TABLET(81 MG) BY MOUTH DAILY [name], MD Taking Active   atorvastatin (LIPITOR) 80 MG tablet [id number] Yes TAKE 1 TABLET(80 MG) BY MOUTH DAILY [name], MD Taking Active   cholecalciferol (VITAMIN D3) 25 MCG (1000 UNIT) tablet [id number] No Take 1,000 Units by mouth daily.  Patient not taking: Reported on 01/30/2021   [provider] Not Taking Active   clopidogrel (PLAVIX) 75 MG tablet [id number] Yes TAKE 1 TABLET(75 MG) BY MOUTH DAILY [name],  [name], MD Taking Active   cyanocobalamin (,VITAMIN B-12,) 1000 MCG/ML injection [id number] Yes INJECT 1 ML INTO THE SKIN ONCE A WEEK FOR 3 WEEKS THEN 1ML EVERY 63 DAYS [name], MD Taking Active   Patient not taking:  Discontinued 01/30/21 1433 (Patient Preference)   furosemide (LASIX) 20 MG tablet [id number] Yes TAKE 3 TABLETS(60 MG) BY MOUTH TWICE DAILY  Patient taking differently: Take 80 mg by mouth daily.   [name], MD Taking Active             Med Note ([place] Jan 30, 2021  2:27 PM)    glucose blood (FREESTYLE LITE) test strip [id number] Yes Check blood sugars twice a day (Dx. 250.02) [name], MD Taking Active Spouse/Significant Other  insulin glargine (LANTUS) 100 UNIT/ML injection [id number] Yes Inject 0.18 mLs (18 Units total) into the skin daily. [name], MD Taking Active            Med Note [name], [name]   Tue Jan 30, 2021  2:28 PM) Taking 15 units QPM  insulin lispro (HUMALOG) 100 UNIT/ML injection [id number] Yes Inject 2-10 units three times daily before meals per sliding scale: If premeal sugar is <130, inject 2 units; if 131-180, inject 4 units; if 181-240, inject 6 units; if 241-300, inject 8 units, if  >300, inject 10 units [name], MD Taking Active   Insulin Syringe-Needle U-100 (INSULIN SYRINGE .5CC/30GX5/16") 30G X 5/16" 0.5 ML MISC [id number] Yes USE AS DIRECTED WITH INSULIN [name], MD Taking Active   levothyroxine (SYNTHROID) 150 MCG tablet [id number] Yes Take 1 tablet (150 mcg total) by mouth daily before breakfast. [name], MD Taking Active   metolazone (ZAROXOLYN) 2.5 MG tablet [id number] No Take 2.5 mg by mouth. Twice weekly  Patient not taking: Reported on 01/30/2021   [provider] Not Taking Active   Multiple Vitamin (MULTIVITAMIN) tablet [id number] Yes Take 1 tablet by mouth daily. [provider] Taking Active   pregabalin (LYRICA) 100 MG capsule [id number] Yes TAKE 1 CAPSULE(100 MG) BY MOUTH THREE TIMES DAILY [name], MD Taking Active            Med Note ([place] Jan 30, 2021  2:31 PM) Taking 1 capsule twice daily  Semaglutide, 1 MG/DOSE, (OZEMPIC, 1 MG/DOSE,) 4 MG/3ML SOPN [id number] Yes Inject 0.75 mLs (1 mg total) into the skin once a week. [name], MD Taking Active   Syringe/Needle, Disp, (SYRINGE 3CC/25GX1") 25G X 1" 3 ML MISC [id number] Yes Use as directed to administer b12 injections. [name], MD Taking Active   zinc sulfate 220 (50 Zn) MG capsule [id number] No Take 1 capsule (220 mg total) by mouth daily.  Patient not taking: Reported on 01/30/2021   [name], MD Not Taking Active             Patient Active Problem List   Diagnosis Date Noted   Open wound 01/25/2021   History of colon polyps 10/15/2020   Postoperative hemorrhage involving digestive system following digestive system procedure 09/19/2020   Acute renal failure superimposed on stage 4 chronic kidney disease ([place]) 09/17/2020   Acute cholecystitis without calculus 09/09/2020   Type 2 diabetes mellitus, with long-term current use of insulin ([place]) 09/09/2020   Cryptogenic stroke ([place]) 07/13/2020   History of loop recorder 07/13/2020   Weakness 06/18/2020   History of 2019 novel coronavirus disease (COVID-19) 05/20/2020   CKD (chronic kidney disease) stage  4, GFR 15-29 ml/min (HCC) 04/04/2020   Pneumonia due to COVID-19 virus 04/03/2020   AKI (acute kidney injury) ([place]) 04/03/2020   Elevated troponin 04/03/2020   Acquired trigger finger 06/08/2019   Lymphedema 06/08/2019   Anemia 04/17/2019   Swelling of left lower extremity 01/10/2019   Facial droop 04/09/2018   Daytime somnolence 03/30/2018   Carotid stenosis 02/03/2018   Intracranial vascular stenosis 09/12/2017   Cough 01/20/2017   Bell's palsy 11/10/2016   History of CVA (cerebrovascular accident) 11/10/2016   Benign localized hyperplasia of prostate with urinary obstruction 10/27/2016   History of nephrolithiasis 10/27/2016   TIA (transient ischemic attack) 10/20/2016   Near syncope 06/23/2016   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   Neuropathy 08/06/2015   Health care maintenance 08/06/2015   Essential hypertension 08/06/2015   Heme positive stool 10/10/2013   Hypothyroidism 10/10/2013   Microalbuminuria 10/10/2013   Hyperlipidemia 10/10/2013   B12 deficiency 10/10/2013   Diabetes ([place]) 07/04/2013   Environmental allergies 07/04/2013     Conditions to be addressed/monitored: HTN, HLD, DMII, and CKD  Care Plan : Medication Management  Updates made by De Hollingshead, RPH-CPP since 03/09/2021 12:00 AM     Problem: Diabetes, CKD, chronic pain, HTN      Long-Range Goal: Disease Progression Prevention   Start Date: 10/06/2020  This Visit's Progress: On track  Recent Progress: On track  Priority: High  Note:   Current Barriers:  Unable to independently monitor therapeutic efficacy Suboptimal therapeutic regimen for diabetes, HTN  Pharmacist Clinical Goal(s):  Over the next 90 days, patient will achieve adherence to monitoring guidelines and medication adherence to achieve therapeutic efficacy through collaboration with PharmD and provider.   Interventions: 1:1 collaboration with [name], MD regarding development and update of comprehensive plan of care as evidenced by provider attestation and co-signature Inter-disciplinary care team collaboration (see longitudinal plan of care) Comprehensive medication review performed; medication list updated in electronic medical record  Health Maintenance: Due for diabetic eye exam. Discussed with patient previously. Encouraged to schedule. Will remind moving forward  Diabetes: Uncontrolled; current treatment: Ozempic 1 mg weekly, Lantus 13 units units QPM, Humalog 4-12 units with meals per sliding scale (If premeal sugar is <130, inject 4 units; if 131-180, inject 6 units; if 181-240, inject 8 units; if 241-300, inject 10 units, if  >300, inject 12 units Denies any GI upset (nausea, vomiting, diarrhea). Previously reminded that if this ever develops and is believed to be resulted to Ozempic therapy, appropriate to discontinue to prevent dehydration that could be hard on his kidneys Renal fx limits use of metformin, SGLT2 Concerned about accuracy of Libre. Reviewed lag time between interstitial tissue and blood glucose. Reviewed improved accuracy of CGM vs many  traditional glucometers. Has not used Libre since 02/28/21 per [facility]. Is driving, so is not home with glucose readings. Does feel sugars have "improved" since we adjusted insulin regimen. Denies any concerns with hypoglycemia Given lack of BG readings, unable to adjust regimen. Continue current regimen at this time. Follow up with PCP next week as scheduled. Encouraged to continue to check blood sugar 3-4 times daily and bring with him to PCP appointment next week.  Hypertension complicated by CKD: Uncontrolled; current treatment: amlodipine 10 mg QAM, furosemide 80 mg  Home BP readings: did not discuss today Previously recommended to continue current regimen at this time and continue collaboration with nephrology   Hyperlipidemia and ASCVD risk reduction: Controlled per last lipid panel; current treatment: atorvastatin 80 mg daily  Antiplatelet regimen: aspirin 81 mg daily, clopidogrel 75 mg daily  Previously recommended to continue current regimen at this time, along with collaboration with cardiology.   Depression/Anxiety with neuropathy Moderately well managed; current treatment: pregabalin 100 mg BID-TID  Previously recommended to continue current regimen at this time along with PCP collaboration  Hypothyroidism: Uncontrolled but subsequent dose change; current regimen: levothyroxine 175 mcg daily Recommend to continue current regimen at this time. Recheck TSH 6-8 weeks after dose adjustment.   Supplements: Vitamin B12 injections weekly x 3 weeks, about to increase to monthly  Patient Goals/Self-Care Activities Over the next 90 days, patient will:  - take medications as prescribed check glucose at least 3 times daily using CGM, document, and provide at future appointments check blood pressure daily, document, and provide at future appointments  Follow Up Plan: Telephone follow up appointment with care management team member scheduled for: ~ 6 weeks      Medication  Assistance:  None required.  Patient affirms current coverage meets needs.  Follow Up:  Patient agrees to Care Plan and Follow-up.  Plan: Telephone follow up appointment with care management team member scheduled for:  ~ 6 weeks  Catie [name], PharmD, [place], [place] Clinical Pharmacist [employer] at [employer] [phone]

## 2021-03-16 ENCOUNTER — Ambulatory Visit: Payer: No Typology Code available for payment source | Admitting: Internal Medicine

## 2021-03-16 NOTE — Progress Notes (Signed)
[facility] Summary Report / Loop Recorder 

## 2021-04-02 ENCOUNTER — Ambulatory Visit (INDEPENDENT_AMBULATORY_CARE_PROVIDER_SITE_OTHER): Payer: No Typology Code available for payment source

## 2021-04-02 DIAGNOSIS — I639 Cerebral infarction, unspecified: Secondary | ICD-10-CM

## 2021-04-04 LAB — CUP PACEART REMOTE DEVICE CHECK
Date Time Interrogation Session: 20220807032405
Implantable Pulse Generator Implant Date: 20190604

## 2021-04-11 DIAGNOSIS — M79676 Pain in unspecified toe(s): Secondary | ICD-10-CM

## 2021-04-18 ENCOUNTER — Telehealth: Payer: Self-pay | Admitting: Pharmacist

## 2021-04-18 ENCOUNTER — Telehealth: Payer: No Typology Code available for payment source

## 2021-04-18 NOTE — Telephone Encounter (Signed)
  Chronic Care Management   Note  04/18/2021 Name: Gregory Crane MRN: [id number] DOB: [birth year]   Attempted to contact patient for scheduled appointment for medication management support. Unable to leave voicemail on patient's phone.   Plan: - If I do not hear back from the patient by end of business today, will collaborate with Care Guide to outreach to schedule follow up with me  Catie [name], PharmD, [place], [place] Pharmacist [employer] at [employer] [phone]

## 2021-04-20 ENCOUNTER — Telehealth: Payer: Self-pay

## 2021-04-20 NOTE — Chronic Care Management (AMB) (Signed)
  Care Management   Note  04/20/2021 Name: Gregory Crane MRN: [id number] DOB: Jul 03, 1971  Gregory Crane is a 51 y.o. year old male who is a primary care patient of [name], MD and is actively engaged with the care management team. I reached out to [name] by phone today to assist with re-scheduling a follow up visit with the Pharmacist  Follow up plan: Unsuccessful telephone outreach attempt made.  The care management team will reach out to the patient again over the next 7 days.  If patient returns call to provider office, please advise to call [facility]  at [place], [place], [place], [place] [address] Direct Dial: [phone] [email or web address]: [email or web address]

## 2021-04-24 NOTE — Progress Notes (Signed)
[facility] Summary Report / Loop Recorder 

## 2021-04-27 NOTE — Chronic Care Management (AMB) (Signed)
  Care Management   Note  04/27/2021 Name: Gregory Crane MRN: [id number] DOB: [birth year]  [name] is a 50 y.o. year old male who is a primary care patient of [name], MD and is actively engaged with the care management team. I reached out to [name] by phone today to assist with re-scheduling a follow up visit with the Pharmacist  Follow up plan: Unsuccessful telephone outreach attempt made. A HIPAA compliant phone message was left for the patient providing contact information and requesting a return call.  The care management team will reach out to the patient again over the next 7 days.  If patient returns call to provider office, please advise to call [facility]  at [place], [place], [place], [place] [address] Direct Dial: [phone] [name]: [email or web address]

## 2021-04-27 NOTE — Telephone Encounter (Signed)
Patient returned my call to reschedule f/u with Catie. Patient stated that he wanted you both to know that he had to cancel appt with PCP due to being in the process of getting disability and he is without insurance right now. Pt stated that he has been a patient of Dr. Lars Mage for so long he didn't want her to think he had changed providers.  Thank you  Noreene Larsson, [place], [place], [place] [address] Direct Dial: [phone] [email or web address]: [email or web address]

## 2021-04-27 NOTE — Chronic Care Management (AMB) (Signed)
  Care Management   Note  04/27/2021 Name: [name] MRN: [id number] DOB: [birth year]  Gregory Crane is a 50 y.o. year old male who is a primary care patient of [name], MD and is actively engaged with the care management team. I reached out to [name] by phone today to assist with re-scheduling a follow up visit with the Pharmacist  Follow up plan: Telephone appointment with care management team member scheduled for:05/09/2021  Noreene Larsson, [place], [place]  [place], [place] [address] Direct Dial: [phone] [email or web address]: [email or web address]

## 2021-04-27 NOTE — Telephone Encounter (Signed)
Patient calling back in and did not have anything with him to write down number to call CCM person back. Patient requested information be sent via mychart. Done.

## 2021-05-03 ENCOUNTER — Ambulatory Visit: Payer: Commercial Managed Care - HMO | Admitting: Podiatry

## 2021-05-07 ENCOUNTER — Ambulatory Visit (INDEPENDENT_AMBULATORY_CARE_PROVIDER_SITE_OTHER): Payer: No Typology Code available for payment source

## 2021-05-07 DIAGNOSIS — I639 Cerebral infarction, unspecified: Secondary | ICD-10-CM

## 2021-05-08 LAB — CUP PACEART REMOTE DEVICE CHECK
Date Time Interrogation Session: 20220909032520
Implantable Pulse Generator Implant Date: 20190604

## 2021-05-09 ENCOUNTER — Other Ambulatory Visit: Payer: Self-pay

## 2021-05-09 ENCOUNTER — Telehealth: Payer: Self-pay | Admitting: Pharmacist

## 2021-05-09 ENCOUNTER — Ambulatory Visit: Payer: No Typology Code available for payment source | Admitting: Pharmacist

## 2021-05-09 DIAGNOSIS — E11319 Type 2 diabetes mellitus with unspecified diabetic retinopathy without macular edema: Secondary | ICD-10-CM

## 2021-05-09 DIAGNOSIS — N184 Chronic kidney disease, stage 4 (severe): Secondary | ICD-10-CM

## 2021-05-09 DIAGNOSIS — Z794 Long term (current) use of insulin: Secondary | ICD-10-CM

## 2021-05-09 DIAGNOSIS — I10 Essential (primary) hypertension: Secondary | ICD-10-CM

## 2021-05-09 DIAGNOSIS — E785 Hyperlipidemia, unspecified: Secondary | ICD-10-CM

## 2021-05-09 MED ORDER — OZEMPIC (1 MG/DOSE) 4 MG/3ML ~~LOC~~ SOPN
1.0000 mg | PEN_INJECTOR | SUBCUTANEOUS | 3 refills | Status: DC
Start: 2021-05-09 — End: 2022-01-27
  Filled 2021-05-09: qty 9, 84d supply, fill #0

## 2021-05-09 MED ORDER — OZEMPIC (0.25 OR 0.5 MG/DOSE) 2 MG/1.5ML ~~LOC~~ SOPN
PEN_INJECTOR | SUBCUTANEOUS | 0 refills | Status: DC
Start: 2021-05-09 — End: 2021-06-26

## 2021-05-09 NOTE — Patient Instructions (Addendum)
Gregory Crane,   It was great talking with you today.   I recommend we get you connected with [facility] to help with your medications until your disability application goes through. They are located at [place] (beside the [employer]). Their phone number is [phone]. I will ask one of their pharmacists to call you to discuss eligibility. They can help [place] apply for patient assistance for the Ozempic as long as you are uninsured.   Let's add back the Ozempic with this sample. Start Ozempic 0.25 mg weekly for 2 weeks, then increase to 0.5 mg weekly. Please call me if you need more samples while we are waiting on long term solutions.   Ozempic may cause stomach upset, queasiness, or constipation, especially when first starting. This generally improves over time. Call our office if these symptoms occur and worsen, or if you have severe symptoms such as vomiting, diarrhea, or stomach pain. These symptoms can cause dehydration which can be hard on your kidneys, so if you experience any of this PLEASE stop the medication and call me   I've asked our Care Guides to give you a call to talk about financial assistance options to help you until disability comes through.   Call me with any questions or concerns!  Catie [name], PharmD [phone]  Visit Information   Goals Addressed               This Visit's Progress     Patient Stated     Medication Monitoring (pt-stated)        Patient Goals/Self-Care Activities Over the next 90 days, patient will:  - take medications as prescribed check glucose at least 3 times daily using CGM, document, and provide at future appointments check blood pressure daily, document, and provide at future appointments        Print copy of patient instructions, educational materials, and care plan provided in person.  Plan: Telephone follow up appointment with care management team member scheduled for:  ~5 weeks   Catie  [name], PharmD, [place], [place] Clinical Pharmacist [employer] at [employer] [phone]

## 2021-05-09 NOTE — Chronic Care Management (AMB) (Signed)
Care Management   Pharmacy Note  05/09/2021 Name: Olyn Landstrom MRN: [id number] DOB: Oct 31, 1970  Subjective: Aryaan Persichetti is a 50 y.o. year old male who is a primary care patient of [name], MD. The Care Management team was consulted for assistance with care management and care coordination needs.    Engaged with patient by telephone for follow up visit in response to provider referral for pharmacy case management and/or care coordination services.   The patient was given information about Care Management services today including:  Care Management services includes personalized support from designated clinical staff supervised by the patient's primary care provider, including individualized plan of care and coordination with other care providers. 24/7 contact phone numbers for assistance for urgent and routine care needs. The patient may stop case management services at any time by phone call to the office staff.  Patient agreed to services and consent obtained.  Assessment:  Review of patient status, including review of consultants reports, laboratory and other test data, was performed as part of comprehensive evaluation and provision of chronic care management services.   SDOH (Social Determinants of Health) assessments and interventions performed:  SDOH Interventions    Flowsheet Row Most Recent Value  SDOH Interventions   Financial Strain Interventions Other (Comment)  [Care Guide referral, Medication Management referral]        Objective:  Lab Results  Component Value Date   CREATININE 4.27 (H) 01/08/2021   CREATININE 3.52 (H) 08/15/2020   CREATININE 2.45 (H) 05/16/2020    Lab Results  Component Value Date   HGBA1C 9.9 (H) 01/08/2021       Component Value Date/Time   CHOL 127 01/08/2021 1158   CHOL 109 03/28/2020 1202   TRIG 159.0 (H) 01/08/2021 1158   HDL 28.90 (L) 01/08/2021 1158   HDL 31 (L) 03/28/2020 1202   CHOLHDL 4 01/08/2021 1158   VLDL  31.8 01/08/2021 1158   LDLCALC 66 01/08/2021 1158   LDLCALC 60 03/28/2020 1202     Clinical ASCVD: Yes  The ASCVD Risk score (Arnett DK, et al., 2019) failed to calculate for the following reasons:   The patient has a prior MI or stroke diagnosis     BP Readings from Last 3 Encounters:  01/08/21 136/76  12/01/20 (!) 182/77  10/31/20 (!) 162/77    Care Plan  No Known Allergies  Medications Reviewed Today     Reviewed by De Hollingshead, RPH-CPP (Pharmacist) on 05/09/21 at 60  Med List Status: <None>   Medication Order Taking? Sig Documenting Provider Last Dose Status Informant  Accu-Chek FastClix Lancets MISC [id number] Yes USE TO CHECK BLOOD SUGAR THREE TIMES DAILY AS DIRECTED [place], MD Taking Active   amLODipine (NORVASC) 10 MG tablet [id number] Yes TAKE 1 TABLET(10 MG) BY MOUTH DAILY [name], MD Taking Active   aspirin (ASPIRIN LOW DOSE) 81 MG EC tablet [id number] Yes TAKE 1 TABLET(81 MG) BY MOUTH DAILY [name], MD Taking Active   atorvastatin (LIPITOR) 80 MG tablet [id number] Yes TAKE 1 TABLET(80 MG) BY MOUTH DAILY [name], MD Taking Active   clopidogrel (PLAVIX) 75 MG tablet [id number] Yes TAKE 1 TABLET(75 MG) BY MOUTH DAILY [name], MD Taking Active   Continuous Blood Gluc Sensor (FREESTYLE LIBRE 2 SENSOR) [place] [id number] Yes Use to check glucose at least three times daily [name], MD Taking Active   cyanocobalamin (,VITAMIN B-12,) 1000 MCG/ML injection [id number] Yes INJECT 1 ML INTO THE SKIN ONCE A WEEK  FOR 3 WEEKS THEN 1ML EVERY 30 DAYS [name], [name], MD Taking Active   Ferrous Gluconate 256 (28 Fe) MG TABS [id number] No Take by mouth.  Patient not taking: Reported on 05/09/2021   [provider] Not Taking Active   furosemide (LASIX) 20 MG tablet [id number] Yes Take 80 mg by mouth daily. Per nephrology [provider] Taking Active   glucose blood (FREESTYLE LITE) test strip [id number] Yes Check blood  sugars twice a day (Dx. 250.02) [name], MD Taking Active Spouse/Significant Other  insulin glargine (LANTUS) 100 UNIT/ML injection [id number] Yes Inject 0.13 mLs (13 Units total) into the skin daily. [name], MD Taking Active            Med Note [name], [name]   Wed May 09, 2021 10:38 AM) 10 units   insulin lispro (HUMALOG) 100 UNIT/ML injection [id number] Yes Inject 4-12 units three times daily before meals per sliding scale: If premeal sugar is <130, inject 4 units; if 131-180, inject 6 units; if 181-240, inject 8 units; if 241-300, inject 10 units, if  >300, inject 12 units (max daily dose 36 units) [name], MD Taking Active   Insulin Syringe-Needle U-100 (INSULIN SYRINGE .5CC/30GX5/16") 30G X 5/16" 0.5 ML MISC [id number] Yes USE AS DIRECTED WITH INSULIN [name], MD Taking Active   levothyroxine (SYNTHROID) 175 MCG tablet [id number] Yes Take 1 tablet (175 mcg total) by mouth daily before breakfast. [name], MD Taking Active   Multiple Vitamin (MULTIVITAMIN) tablet [id number] Yes Take 1 tablet by mouth daily. [provider] Taking Active   pregabalin (LYRICA) 100 MG capsule [id number] Yes Take 1 capsule (100 mg total) by mouth 3 (three) times daily. [name], MD Taking Active            Med Note [name], Lavonna Rua May 09, 2021 10:39 AM) Taking BID  Semaglutide, 1 MG/DOSE, (OZEMPIC, 1 MG/DOSE,) 4 MG/3ML SOPN [id number] No Inject 0.75 mLs (1 mg total) into the skin once a week.  Patient not taking: Reported on 05/09/2021   [name], MD Not Taking Active   Syringe/Needle, Disp, (SYRINGE 3CC/25GX1") 25G X 1" 3 ML MISC [id number] Yes Use as directed to administer b12 injections. [name], MD Taking Active             Patient Active Problem List   Diagnosis Date Noted   Open wound 01/25/2021   History of colon polyps 10/15/2020   Postoperative hemorrhage involving digestive system following digestive system procedure  09/19/2020   Acute renal failure superimposed on stage 4 chronic kidney disease ([place]) 09/17/2020   Acute cholecystitis without calculus 09/09/2020   Type 2 diabetes mellitus, with long-term current use of insulin ([place]) 09/09/2020   Cryptogenic stroke ([place]) 07/13/2020   History of loop recorder 07/13/2020   Weakness 06/18/2020   History of 2019 novel coronavirus disease (COVID-19) 05/20/2020   CKD (chronic kidney disease) stage 4, GFR 15-29 ml/min ([place]) 04/04/2020   Pneumonia due to COVID-19 virus 04/03/2020   AKI (acute kidney injury) ([place]) 04/03/2020   Elevated troponin 04/03/2020   Acquired trigger finger 06/08/2019   Lymphedema 06/08/2019   Anemia 04/17/2019   Swelling of left lower extremity 01/10/2019   Facial droop 04/09/2018   Daytime somnolence 03/30/2018   Carotid stenosis 02/03/2018   Intracranial vascular stenosis 09/12/2017   Cough 01/20/2017   Bell's palsy 11/10/2016   History of CVA (cerebrovascular accident) 11/10/2016   Benign localized hyperplasia of prostate with urinary obstruction 10/27/2016  History of nephrolithiasis 10/27/2016   TIA (transient ischemic attack) 10/20/2016   Near syncope 06/23/2016   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   Neuropathy 08/06/2015   Health care maintenance 08/06/2015   Essential hypertension 08/06/2015   Heme positive stool 10/10/2013   Hypothyroidism 10/10/2013   Microalbuminuria 10/10/2013   Hyperlipidemia 10/10/2013   B12 deficiency 10/10/2013   Diabetes ([place]) 07/04/2013   Environmental allergies 07/04/2013    Conditions to be addressed/monitored: CAD, HLD, DMII, and CKD  Care Plan : Medication Management  Updates made by De Hollingshead, RPH-CPP since 05/09/2021 12:00 AM     Problem: Diabetes, CKD, chronic pain, HTN      Long-Range Goal: Disease Progression Prevention   Start Date: 10/06/2020  This Visit's Progress: On track  Recent Progress: On track  Priority: High  Note:   Current Barriers:  Unable to  independently monitor therapeutic efficacy Suboptimal therapeutic regimen for diabetes, HTN  Pharmacist Clinical Goal(s):  Over the next 90 days, patient will achieve adherence to monitoring guidelines and medication adherence to achieve therapeutic efficacy through collaboration with PharmD and provider.   Interventions: 1:1 collaboration with [name], MD regarding development and update of comprehensive plan of care as evidenced by provider attestation and co-signature Inter-disciplinary care team collaboration (see longitudinal plan of care) Comprehensive medication review performed; medication list updated in electronic medical record  SDOH: Reports that he has lost his job and could not afford insurance premium anymore, so he is uninsured. Has canceled several provider appointments due to not being able to afford. Placed Care Guide referral for financial support resources. Will connect with [facility] for medication access if disability application takes longer than anticipated. He reports he has an ample supply of all medications right now except Ozempic.   Diabetes: Uncontrolled; current treatment: Ozempic 1 mg weekly - has not been able to afford to pick up in several months per his report; Lantus 13 units units QPM, Humalog 4-12 units with meals per sliding scale (If premeal sugar is <130, inject 4 units; if 131-180, inject 6 units; if 181-240, inject 8 units; if 241-300, inject 10 units, if  >300, inject 12 units; though today reports that if blood sugar is in "high 100s or 200s" he takes 7-10 units and if lower takes 5 units Renal fx limits use of metformin, SGLT2 Current glucose: fasting: 130-140s; pre supper: 150s Will collaborate with [facility] for Ozempic patient assistance application. Informed patient that I will ask eligibility coordinator pharmacist to give him a call.  Restart Ozempic at 0.25 mg weekly for 2 weeks, then increase to  0.5 mg weekly (quicker titration due to previous tolerability). Will send script for 1 mg dose to [facility] for continuing therapy.  Reviewed that if he develops GI upset (nausea, vomiting, diarrhea) that results in dehydration that could be damaging to his kidneys, he should stop Ozempic.  Scheduled f/u call in ~ 5 weeks  Hypertension complicated by CKD: Uncontrolled; current treatment: amlodipine 10 mg QAM, furosemide 80 mg  Home BP readings: patient has not checked recently.  Recommended to continue current regimen at this time and continue collaboration with nephrology. Advised to restart checking blood pressures periodically at home. Reviewed importance of tighter blood pressure control for nephroprotection.   Hyperlipidemia and ASCVD risk reduction: Controlled per last lipid panel; current treatment: atorvastatin 80 mg daily Antiplatelet regimen: aspirin 81 mg daily, clopidogrel 75 mg daily  Recommended to continue current regimen at this time, along with collaboration  with cardiology.   Depression/Anxiety with neuropathy Moderately well managed; current treatment: pregabalin 100 mg BID-TID ; usually taking BID Denies sedation with pregabalin, even given kidney function Recommended to continue current regimen at this time along with PCP collaboration  Hypothyroidism: Uncontrolled but subsequent dose change; current regimen: levothyroxine 175 mcg mcg Due for TSH recheck, but lack of insurance preventing patient from scheduling follow up labs. Care Guide referral as above.  Recommend to continue current regimen at this time. Recheck TSH when able  Supplements: Vitamin B12 injections monthly  Patient Goals/Self-Care Activities Over the next 90 days, patient will:  - take medications as prescribed check glucose at least 3 times daily, document, and provide at future appointments check blood pressure daily, document, and provide at future appointments  Follow Up  Plan: Telephone follow up appointment with care management team member scheduled for: ~ 5 weeks      Medication Assistance:   Uninsured. Collaborating with [facility]  Follow Up:  Patient agrees to Care Plan and Follow-up.  Plan: Telephone follow up appointment with care management team member scheduled for:  ~5 weeks  Catie [name], PharmD, [place], [place] Clinical Pharmacist [employer] at [employer] [phone]

## 2021-05-09 NOTE — Telephone Encounter (Signed)
Medication Samples have been labeled and logged for the patient. He plans to come pick up later today  Drug name: Ozempic       Strength: 2 mg/1.5 mL        Qty: 1 pen  LOT: [id number]  Exp.Date: 10/24/23  Dosing instructions: Inject 0.25 mg weekly for 2 weeks then increase to 0.5 mg weekly  The patient has been instructed regarding the correct time, dose, and frequency of taking this medication, including desired effects and most common side effects.   De Hollingshead 11:14 AM 05/09/2021

## 2021-05-10 ENCOUNTER — Other Ambulatory Visit: Payer: Self-pay

## 2021-05-10 NOTE — Telephone Encounter (Signed)
Medication sample has been provided to the patient by Dominica.

## 2021-05-11 ENCOUNTER — Telehealth: Payer: Self-pay | Admitting: Pharmacy Technician

## 2021-05-11 NOTE — Telephone Encounter (Signed)
Provided patient with new patient packet to obtain [facility] services.  [facility] must receive requested financial documentation within 30 days in order to determine eligibility and provide medication assistance.  [place] [facility]

## 2021-05-12 ENCOUNTER — Other Ambulatory Visit: Payer: Self-pay | Admitting: Internal Medicine

## 2021-05-14 ENCOUNTER — Telehealth: Payer: Self-pay | Admitting: *Deleted

## 2021-05-14 NOTE — Telephone Encounter (Signed)
   Telephone encounter was:  Unsuccessful.  05/14/2021 Name: Gregory Crane MRN: [id number] DOB: May 11, 1971  Unsuccessful outbound call made today to assist with:  insurance , health care options   Outreach Attempt:  1st Attempt  A HIPAA compliant voice message was left requesting a return call.  Instructed patient to call back at   Instructed patient to call back at [phone]  at their earliest convenience. .  [place], Care Management  [phone] [address] , [place] [address] Email : [email or web address]

## 2021-05-14 NOTE — Progress Notes (Signed)
[facility] Summary Report / Loop Recorder 

## 2021-05-18 ENCOUNTER — Other Ambulatory Visit: Payer: Self-pay

## 2021-06-01 ENCOUNTER — Telehealth: Payer: Self-pay | Admitting: *Deleted

## 2021-06-01 NOTE — Telephone Encounter (Signed)
   Telephone encounter was:  Unsuccessful.  06/01/2021 Name: Gregory Crane MRN: [id number] DOB: 06/09/1971  Unsuccessful outbound call made today to assist with:  Food Insecurity  Outreach Attempt:  2nd Attempt  A HIPAA compliant voice message was left requesting a return call.  Instructed patient to call back at   Instructed patient to call back at [phone]  at their earliest convenience. .  [place], Care Management  [phone] [address] , [place] [address] Email : [email or web address]

## 2021-06-06 ENCOUNTER — Telehealth: Payer: Self-pay | Admitting: *Deleted

## 2021-06-06 NOTE — Telephone Encounter (Signed)
   Telephone encounter was:  Unsuccessful.  06/06/2021 Name: Gregory Crane MRN: [id number] DOB: 03-14-1971  Unsuccessful outbound call made today to assist with:  Food Insecurity  Outreach Attempt:  3rd Attempt.  Referral closed unable to contact patient.  A HIPAA compliant voice message was left requesting a return call.  Instructed patient to call back at   Instructed patient to call back at [phone]  at their earliest convenience. .  [place], Care Management  [phone] [address] , [place] [address] Email : [email or web address]

## 2021-06-07 ENCOUNTER — Telehealth: Payer: Self-pay | Admitting: *Deleted

## 2021-06-07 NOTE — Telephone Encounter (Signed)
   Telephone encounter was:  Unsuccessful.  06/07/2021 Name: Gregory Crane MRN: [id number] DOB: [birth year]  Unsuccessful outbound call made today to assist with:  Food Insecurity and Financial Difficulties related to patient with no income wishing for community support information but patient has not returned 3x call and left messages .  Outreach Attempt:  3rd Attempt.  Referral closed unable to contact patient.  A HIPAA compliant voice message was left requesting a return call.  Instructed patient to call back at   Instructed patient to call back at [phone]  at their earliest convenience. .  [place], Care Management  [phone] [address] , [place] [address] Email : [email or web address]

## 2021-06-11 ENCOUNTER — Ambulatory Visit (INDEPENDENT_AMBULATORY_CARE_PROVIDER_SITE_OTHER): Payer: No Typology Code available for payment source

## 2021-06-11 DIAGNOSIS — I639 Cerebral infarction, unspecified: Secondary | ICD-10-CM | POA: Diagnosis not present

## 2021-06-13 LAB — CUP PACEART REMOTE DEVICE CHECK
Date Time Interrogation Session: 20221012032544
Implantable Pulse Generator Implant Date: 20190604

## 2021-06-14 ENCOUNTER — Telehealth: Payer: No Typology Code available for payment source

## 2021-06-14 ENCOUNTER — Telehealth: Payer: Self-pay | Admitting: Pharmacist

## 2021-06-14 NOTE — Telephone Encounter (Signed)
  Chronic Care Management   Note  06/14/2021 Name: Tajae Rybicki MRN: [id number] DOB: 08-Jun-1971   Attempted to contact patient for scheduled appointment for medication management support. Left HIPAA compliant message for patient to return my call at their convenience.    Plan: - If I do not hear back from the patient by end of business today, will collaborate with Care Guide to outreach to schedule follow up with me  Catie [name], PharmD, [place], [place] Pharmacist [employer] at [employer] [phone]

## 2021-06-15 ENCOUNTER — Encounter: Payer: Self-pay | Admitting: Internal Medicine

## 2021-06-18 NOTE — Telephone Encounter (Signed)
He would need to f/u with Dr Caryl Comes regarding this.

## 2021-06-19 NOTE — Progress Notes (Signed)
[facility] Summary Report / Loop Recorder 

## 2021-06-20 ENCOUNTER — Telehealth: Payer: Self-pay

## 2021-06-20 ENCOUNTER — Telehealth: Payer: Self-pay | Admitting: Internal Medicine

## 2021-06-20 NOTE — Telephone Encounter (Signed)
Call transferred back from Access nurse. Sent call to [employer] nurse.

## 2021-06-20 NOTE — Telephone Encounter (Signed)
Reviewed note.  Describes decreased hearing and feeling like ear is stopped up.  Agree with need for evaluation - so can determine etiology.  Need to be able to look in ear, etc.  Per his note, he is not able to be seen now due to no insurance.  [facility], if unable to [facility] anywhere else.  I can work him in if he will agree to an appt.  If any pain or acute symptoms or change in symptoms, he does need to be seen urgently.

## 2021-06-20 NOTE — Telephone Encounter (Signed)
See other phone message for symptoms and recommendations.  Agree with need for evaluation.

## 2021-06-20 NOTE — Telephone Encounter (Signed)
Patient informed, Due to the high volume of calls and your symptoms we have to forward your call to our Triage Nurse to expedient your call. Please hold for the transfer.  Patient transferred to [facility] Nurse. Due to a possible ear infection in his left ear and not being able to hear out of his ear the past two weeks.No openings in office or virtual for the patient to be evaluated.

## 2021-06-20 NOTE — Telephone Encounter (Signed)
Patient prefers to see PCP he cannot get [facility] have not returned his call and he called [facility] and they cannot see him until December. He will go if it worsens to ED but prefers to see PCP.

## 2021-06-20 NOTE — Telephone Encounter (Signed)
Access Nurse recommendations were for patient to be seen within 24 hours, FO staff mentioned could be stroke symptoms to LPN , not patient or Access Nurse. Patient stated left ear stopped up X 2 weeks, he tried cleaning his ear which was no help then dizziness started couple of days ago which is worse in the AM. Patient advised me he is waiting for disability he does not have the money to be seen. I advised patient PCP out of office I would forward message I did advise him if symptoms worsen or dizziness returns he should be seen due to could be something more serious than cerumen impaction, patient stated he would go if symptoms worsen he hope to hear from disability with in the next day or so he was advised its in the final step of determination.

## 2021-06-20 NOTE — Telephone Encounter (Signed)
Lorriane Shire transferred call from access nurse. Patient stated that he cannot hear out of his left ear x2 weeks. But is also having dizziness. Advised that given the new acute onset dizziness, patient needs to be evaluated at acute care. Patient stated that he does not have insurance and cannot do that right now. There was question of him having another stroke. Pt confirmed no unilateral weakness or any other stroke symptoms, etc. Advised again that given his history and new onset dizziness he needs to be evaluated acutely and then follow up with pcp. Advised that PCP is out of office this afternoon and again he needs to be evaluated. Someone will need to follow up with patient to confirm eval.

## 2021-06-21 NOTE — Telephone Encounter (Signed)
I am out of the office some, I can try to work him in- Tuesday 06/26/21 12:00.  If his ear is the issue, also can refer him to ENT and this may save him two trips.  If any acute issues or symptoms, he does need to be seen earlier.

## 2021-06-22 ENCOUNTER — Encounter: Payer: Self-pay | Admitting: *Deleted

## 2021-06-22 NOTE — Telephone Encounter (Signed)
Tried to reach patient by phone it just rings sending My chart message.

## 2021-06-22 NOTE — Telephone Encounter (Signed)
Patient aware and wanted to See PCP has been scheduled.

## 2021-06-26 ENCOUNTER — Ambulatory Visit (INDEPENDENT_AMBULATORY_CARE_PROVIDER_SITE_OTHER): Payer: Self-pay | Admitting: Internal Medicine

## 2021-06-26 ENCOUNTER — Telehealth: Payer: Self-pay | Admitting: Pharmacy Technician

## 2021-06-26 ENCOUNTER — Other Ambulatory Visit: Payer: Self-pay

## 2021-06-26 ENCOUNTER — Ambulatory Visit: Payer: PRIVATE HEALTH INSURANCE | Admitting: Pharmacist

## 2021-06-26 ENCOUNTER — Telehealth: Payer: Self-pay | Admitting: Internal Medicine

## 2021-06-26 VITALS — BP 180/100 | HR 77 | Temp 97.9°F | Resp 16 | Ht 72.0 in | Wt 277.0 lb

## 2021-06-26 DIAGNOSIS — D649 Anemia, unspecified: Secondary | ICD-10-CM

## 2021-06-26 DIAGNOSIS — Z23 Encounter for immunization: Secondary | ICD-10-CM

## 2021-06-26 DIAGNOSIS — E785 Hyperlipidemia, unspecified: Secondary | ICD-10-CM

## 2021-06-26 DIAGNOSIS — Z794 Long term (current) use of insulin: Secondary | ICD-10-CM

## 2021-06-26 DIAGNOSIS — I779 Disorder of arteries and arterioles, unspecified: Secondary | ICD-10-CM

## 2021-06-26 DIAGNOSIS — E538 Deficiency of other specified B group vitamins: Secondary | ICD-10-CM

## 2021-06-26 DIAGNOSIS — I10 Essential (primary) hypertension: Secondary | ICD-10-CM

## 2021-06-26 DIAGNOSIS — G629 Polyneuropathy, unspecified: Secondary | ICD-10-CM

## 2021-06-26 DIAGNOSIS — N183 Chronic kidney disease, stage 3 unspecified: Secondary | ICD-10-CM

## 2021-06-26 DIAGNOSIS — N184 Chronic kidney disease, stage 4 (severe): Secondary | ICD-10-CM

## 2021-06-26 DIAGNOSIS — E1122 Type 2 diabetes mellitus with diabetic chronic kidney disease: Secondary | ICD-10-CM

## 2021-06-26 DIAGNOSIS — Z8673 Personal history of transient ischemic attack (TIA), and cerebral infarction without residual deficits: Secondary | ICD-10-CM

## 2021-06-26 DIAGNOSIS — R519 Headache, unspecified: Secondary | ICD-10-CM

## 2021-06-26 DIAGNOSIS — E039 Hypothyroidism, unspecified: Secondary | ICD-10-CM

## 2021-06-26 DIAGNOSIS — E11319 Type 2 diabetes mellitus with unspecified diabetic retinopathy without macular edema: Secondary | ICD-10-CM

## 2021-06-26 DIAGNOSIS — H9190 Unspecified hearing loss, unspecified ear: Secondary | ICD-10-CM

## 2021-06-26 DIAGNOSIS — M7989 Other specified soft tissue disorders: Secondary | ICD-10-CM

## 2021-06-26 LAB — BASIC METABOLIC PANEL
BUN: 30 mg/dL — ABNORMAL HIGH (ref 6–23)
CO2: 26 mEq/L (ref 19–32)
Calcium: 8.7 mg/dL (ref 8.4–10.5)
Chloride: 108 mEq/L (ref 96–112)
Creatinine, Ser: 3.91 mg/dL — ABNORMAL HIGH (ref 0.40–1.50)
GFR: 17.04 mL/min — ABNORMAL LOW (ref >60.00)
Glucose, Bld: 137 mg/dL — ABNORMAL HIGH (ref 70–99)
Potassium: 3.1 mEq/L — ABNORMAL LOW (ref 3.5–5.1)
Sodium: 142 mEq/L (ref 135–145)

## 2021-06-26 LAB — HEPATIC FUNCTION PANEL
ALT: 6 U/L (ref 0–53)
AST: 11 U/L (ref 0–37)
Albumin: 3.2 g/dL — ABNORMAL LOW (ref 3.5–5.2)
Alkaline Phosphatase: 101 U/L (ref 39–117)
Bilirubin, Direct: 0 mg/dL (ref 0.0–0.3)
Total Bilirubin: 0.4 mg/dL (ref 0.2–1.2)
Total Protein: 6 g/dL (ref 6.0–8.3)

## 2021-06-26 LAB — VITAMIN B12: Vitamin B-12: 279 pg/mL (ref 211–911)

## 2021-06-26 LAB — CBC WITH DIFFERENTIAL/PLATELET
Basophils Absolute: 0.1 10*3/uL (ref 0.0–0.1)
Basophils Relative: 0.8 % (ref 0.0–3.0)
Eosinophils Absolute: 0.1 10*3/uL (ref 0.0–0.7)
Eosinophils Relative: 1.6 % (ref 0.0–5.0)
HCT: 37.6 % — ABNORMAL LOW (ref 39.0–52.0)
Hemoglobin: 12.6 g/dL — ABNORMAL LOW (ref 13.0–17.0)
Lymphocytes Relative: 17.9 % (ref 12.0–46.0)
Lymphs Abs: 1.2 10*3/uL (ref 0.7–4.0)
MCHC: 33.4 g/dL (ref 30.0–36.0)
MCV: 83.3 fl (ref 78.0–100.0)
Monocytes Absolute: 0.3 10*3/uL (ref 0.1–1.0)
Monocytes Relative: 5.1 % (ref 3.0–12.0)
Neutro Abs: 5 10*3/uL (ref 1.4–7.7)
Neutrophils Relative %: 74.6 % (ref 43.0–77.0)
Platelets: 239 10*3/uL (ref 150.0–400.0)
RBC: 4.51 Mil/uL (ref 4.22–5.81)
RDW: 13.4 % (ref 11.5–15.5)
WBC: 6.7 10*3/uL (ref 4.0–10.5)

## 2021-06-26 LAB — LIPID PANEL
Cholesterol: 219 mg/dL — ABNORMAL HIGH (ref 0–200)
HDL: 31.3 mg/dL — ABNORMAL LOW (ref >39.00)
NonHDL: 187.46
Total CHOL/HDL Ratio: 7
Triglycerides: 332 mg/dL — ABNORMAL HIGH (ref 0.0–149.0)
VLDL: 66.4 mg/dL — ABNORMAL HIGH (ref 0.0–40.0)

## 2021-06-26 LAB — LDL CHOLESTEROL, DIRECT: Direct LDL: 107 mg/dL

## 2021-06-26 LAB — HEMOGLOBIN A1C: Hgb A1c MFr Bld: 8.8 % — ABNORMAL HIGH (ref 4.6–6.5)

## 2021-06-26 LAB — FERRITIN: Ferritin: 89.5 ng/mL (ref 22.0–322.0)

## 2021-06-26 LAB — TSH: TSH: 13.61 u[IU]/mL — ABNORMAL HIGH (ref 0.35–5.50)

## 2021-06-26 MED ORDER — CYANOCOBALAMIN 1000 MCG/ML IJ SOLN
INTRAMUSCULAR | 0 refills | Status: DC
  Filled 2021-06-26 – 2021-07-09 (×3): qty 3, 21d supply, fill #0

## 2021-06-26 MED ORDER — CLOPIDOGREL BISULFATE 75 MG PO TABS
75.0000 mg | ORAL_TABLET | Freq: Every day | ORAL | 2 refills | Status: DC
  Filled 2021-06-26 – 2021-07-03 (×2): qty 30, 30d supply, fill #0

## 2021-06-26 MED ORDER — OZEMPIC (0.25 OR 0.5 MG/DOSE) 2 MG/1.5ML ~~LOC~~ SOPN
PEN_INJECTOR | SUBCUTANEOUS | 1 refills | Status: DC
  Filled 2021-06-26: qty 1.5, 30d supply, fill #0

## 2021-06-26 MED ORDER — INSULIN GLARGINE 100 UNIT/ML ~~LOC~~ SOLN
13.0000 [IU] | Freq: Every day | SUBCUTANEOUS | 2 refills | Status: DC
  Filled 2021-06-26: qty 10, 76d supply, fill #0

## 2021-06-26 MED ORDER — LEVOTHYROXINE SODIUM 175 MCG PO TABS
ORAL_TABLET | ORAL | 0 refills | Status: DC
  Filled 2021-06-26 – 2021-07-04 (×3): qty 30, 30d supply, fill #0
  Filled 2021-07-04: qty 90, fill #0

## 2021-06-26 MED ORDER — INSULIN LISPRO 100 UNIT/ML IJ SOLN
INTRAMUSCULAR | 3 refills | Status: DC
  Filled 2021-06-26: qty 30, 27d supply, fill #0

## 2021-06-26 MED ORDER — PREGABALIN 100 MG PO CAPS: 100.0000 mg | ORAL_CAPSULE | Freq: Three times a day (TID) | ORAL | 1 refills | Status: DC

## 2021-06-26 MED ORDER — ATORVASTATIN CALCIUM 80 MG PO TABS
ORAL_TABLET | ORAL | 0 refills | Status: DC
  Filled 2021-06-26 – 2021-07-03 (×2): qty 30, 30d supply, fill #0

## 2021-06-26 MED ORDER — AMLODIPINE BESYLATE 10 MG PO TABS
ORAL_TABLET | ORAL | 1 refills | Status: DC
  Filled 2021-06-26 – 2021-07-03 (×2): qty 30, 30d supply, fill #0

## 2021-06-26 NOTE — Telephone Encounter (Signed)
-----   Message from Jacquelynn Cree sent at 06/26/2021  3:10 PM EDT ----- We are showing that this patient has active prescription drug coverage with [employer].  I just called [employer] and spoke with Christian C.  Darrick Meigs confirmed that policy is still active.  I spoke with patient and made him aware that we would need a letter from [facility] indicating that this policy was inactivated before we can provide medication assistance.  Thanks. ----- Message ----- From: De Hollingshead, RPH-CPP Sent: 06/26/2021   1:30 PM EDT To: Bunnie Pion,   Can you outreach this patient as well for enrollment? Dr. Nicki Reaper just sent over prescriptions.   Thanks!  Catie

## 2021-06-26 NOTE — Progress Notes (Signed)
Patient ID: Gregory Crane, male   DOB: June 12, 1971, 50 y.o.   MRN: [id number]   Subjective:    Patient ID: Gregory Crane, male    DOB: [birth year], 50 y.o.   MRN: [id number]  This visit occurred during the SARS-CoV-2 public health emergency.  Safety protocols were in place, including screening questions prior to the visit, additional usage of staff PPE, and extensive cleaning of exam room while observing appropriate contact time as indicated for disinfecting solutions.   Patient here for work in appt.   Chief Complaint  Patient presents with   Ear Fullness   Dizziness   Hypertension   .   HPI Last evaluated 12/2020.  In the process of getting disability and cost is an issue.  States he has not been able to be seen due to not having insurance and does not have the money to pay.  Have discussed open door, etc.  Reports noticed decreased hearing when woke - 06/09/21.  No ear pain.  Has had headache - more posterior.  Some dizziness.  Has not been taking his medication - stating cannot afford.  Discussed pt assistance and Good Rx.  Did start taking left over hydralazine three days ago.  Blood pressure is coming down and he reports feeling better today.  Headache is better.  Dizziness better.  No chest pain reported.  Breathing appears to be stable.  Eating one time per day.  Still taking his insulin.  Mainly eating one good meal per day.  No abdominal pain reported.  Swelling appears to be stable.    Past Medical History:  Diagnosis Date   Allergy    Anemia    Bell's palsy    Diabetes mellitus without complication ([place])    diet controlled   Hypertension    Hypothyroidism    Kidney stones    Pseudotumor cerebri    Stroke [name])    Past Surgical History:  Procedure Laterality Date   COLONOSCOPY WITH PROPOFOL N/A 03/30/2020   Procedure: COLONOSCOPY WITH PROPOFOL;  Surgeon: Lesly Rubenstein, MD;  Location: [facility] ENDOSCOPY;  Service: Endoscopy;  Laterality: N/A;   LOOP RECORDER INSERTION  N/A 01/27/2018   Procedure: LOOP RECORDER INSERTION;  Surgeon: [name], MD;  Location: [facility];  Service: Cardiovascular;  Laterality: N/A;   LUMBAR PUNCTURE     as child   NO PAST SURGERIES     TEE WITHOUT CARDIOVERSION N/A 01/07/2018   Procedure: TRANSESOPHAGEAL ECHOCARDIOGRAM (TEE);  Surgeon: Minna Merritts, MD;  Location: [facility] ORS;  Service: Cardiovascular;  Laterality: N/A;   Family History  Problem Relation Age of Onset   Breast cancer Mother    Diabetes Father    Diabetes Sister    Arthritis Maternal Grandmother    Diabetes Maternal Grandmother    Social History   Socioeconomic History   Marital status: Widowed    Spouse name: [name]   Number of children: 2   Years of education: Not on file   Highest education level: Not on file  Occupational History   Not on file  Tobacco Use   Smoking status: Never   Smokeless tobacco: Never  Vaping Use   Vaping Use: Never used  Substance and Sexual Activity   Alcohol use: No    Alcohol/week: 0.0 standard drinks    Comment: very rarely (1-2/year)   Drug use: No   Sexual activity: Yes  Other Topics Concern   Not on file  Social History Narrative  Lives at home with wife, independent at baseline.   Social Determinants of Health   Financial Resource Strain: High Risk   Difficulty of Paying Living Expenses: Hard  Food Insecurity: Not on file  Transportation Needs: Not on file  Physical Activity: Not on file  Stress: Not on file  Social Connections: Not on file     Review of Systems  Constitutional:  Negative for appetite change and unexpected weight change.  HENT:  Negative for congestion and sinus pressure.   Respiratory:  Negative for cough, chest tightness and shortness of breath.   Cardiovascular:  Negative for chest pain and palpitations.  Gastrointestinal:  Negative for abdominal pain, diarrhea, nausea and vomiting.  Genitourinary:  Negative for difficulty urinating and dysuria.   Musculoskeletal:  Negative for joint swelling and myalgias.  Skin:  Negative for color change and rash.  Neurological:  Positive for dizziness and headaches.  Psychiatric/Behavioral:  Negative for agitation and dysphoric mood.       Objective:     BP (!) 180/100   Pulse 77   Temp 97.9 F (36.6 C)   Resp 16   Ht 6' (1.829 m)   Wt 277 lb (125.6 kg)   SpO2 98%   BMI 37.57 kg/m  Wt Readings from Last 3 Encounters:  06/26/21 277 lb (125.6 kg)  01/08/21 295 lb 9.6 oz (134.1 kg)  12/01/20 280 lb (127 kg)    Physical Exam Constitutional:      General: He is not in acute distress.    Appearance: Normal appearance. He is well-developed.  HENT:     Head: Normocephalic and atraumatic.     Right Ear: External ear normal.     Left Ear: External ear normal.  Eyes:     General: No scleral icterus.       Right eye: No discharge.        Left eye: No discharge.  Cardiovascular:     Rate and Rhythm: Normal rate and regular rhythm.  Pulmonary:     Effort: Pulmonary effort is normal. No respiratory distress.     Breath sounds: Normal breath sounds.  Abdominal:     General: Bowel sounds are normal.     Palpations: Abdomen is soft.     Tenderness: There is no abdominal tenderness.  Musculoskeletal:        General: No tenderness.     Cervical back: Neck supple. No tenderness.     Comments: No increased swelling - lower extremities - stable.   Lymphadenopathy:     Cervical: No cervical adenopathy.  Skin:    Findings: No erythema or rash.  Neurological:     Mental Status: He is alert.  Psychiatric:        Mood and Affect: Mood normal.        Behavior: Behavior normal.     Outpatient Encounter Medications as of 06/26/2021  Medication Sig   Accu-Chek FastClix Lancets MISC USE TO CHECK BLOOD SUGAR THREE TIMES DAILY AS DIRECTED   amLODipine (NORVASC) 10 MG tablet TAKE 1 TABLET(10 MG) BY MOUTH ONCE DAILY.   aspirin (ASPIRIN LOW DOSE) 81 MG EC tablet TAKE 1 TABLET(81 MG) BY MOUTH  DAILY   atorvastatin (LIPITOR) 80 MG tablet TAKE 1 TABLET(80 MG) BY MOUTH ONCE DAILY.   clopidogrel (PLAVIX) 75 MG tablet Take 1 tablet (75 mg total) by mouth once daily.   cyanocobalamin (,VITAMIN B-12,) 1000 MCG/ML injection INJECT 1 ML INTO THE SKIN ONCE A WEEK FOR 3 WEEKS. THEN  1ML EVERY 30 DAYS   Ferrous Gluconate 256 (28 Fe) MG TABS Take by mouth. (Patient not taking: No sig reported)   furosemide (LASIX) 20 MG tablet Take 80 mg by mouth daily. Per nephrology   glucose blood (FREESTYLE LITE) test strip Check blood sugars twice a day (Dx. 250.02)   insulin glargine (LANTUS) 100 UNIT/ML injection Inject 0.13 mLs (13 Units total) into the skin once daily.   insulin lispro (HUMALOG) 100 UNIT/ML injection Inject 4-12 units three times daily before meals per sliding scale: If premeal sugar is <130, inject 4 units; if 131-180, inject 6 units; if 181-240, inject 8 units; if 241-300, inject 10 units, if  >300, inject 12 units (max daily dose 36 units)   Insulin Syringe-Needle U-100 (INSULIN SYRINGE .5CC/30GX5/16") 30G X 5/16" 0.5 ML MISC USE AS DIRECTED WITH INSULIN   levothyroxine (SYNTHROID) 175 MCG tablet TAKE 1 TABLET (175 MCG) BY MOUTH ONCE DAILY BEFORE BREAKFAST.   pregabalin (LYRICA) 100 MG capsule Take 1 capsule (100 mg total) by mouth 3 (three) times daily.   Semaglutide, 1 MG/DOSE, (OZEMPIC, 1 MG/DOSE,) 4 MG/3ML SOPN Inject 1 mg into the skin once a week. (Patient not taking: Reported on 06/26/2021)   Syringe/Needle, Disp, (SYRINGE 3CC/25GX1") 25G X 1" 3 ML MISC Use as directed to administer b12 injections. (Patient not taking: Reported on 06/26/2021)   [DISCONTINUED] amLODipine (NORVASC) 10 MG tablet TAKE 1 TABLET(10 MG) BY MOUTH DAILY (Patient not taking: Reported on 06/26/2021)   [DISCONTINUED] atorvastatin (LIPITOR) 80 MG tablet TAKE 1 TABLET(80 MG) BY MOUTH DAILY (Patient not taking: Reported on 06/26/2021)   [DISCONTINUED] clopidogrel (PLAVIX) 75 MG tablet TAKE 1 TABLET(75 MG) BY MOUTH DAILY    [DISCONTINUED] Continuous Blood Gluc Sensor (FREESTYLE LIBRE 2 SENSOR) MISC Use to check glucose at least three times daily   [DISCONTINUED] cyanocobalamin (,VITAMIN B-12,) 1000 MCG/ML injection INJECT 1 ML INTO THE SKIN ONCE A WEEK FOR 3 WEEKS THEN 1ML EVERY 30 DAYS (Patient not taking: Reported on 06/26/2021)   [DISCONTINUED] insulin glargine (LANTUS) 100 UNIT/ML injection Inject 0.13 mLs (13 Units total) into the skin daily.   [DISCONTINUED] insulin lispro (HUMALOG) 100 UNIT/ML injection Inject 4-12 units three times daily before meals per sliding scale: If premeal sugar is <130, inject 4 units; if 131-180, inject 6 units; if 181-240, inject 8 units; if 241-300, inject 10 units, if  >300, inject 12 units (max daily dose 36 units)   [DISCONTINUED] levothyroxine (SYNTHROID) 175 MCG tablet TAKE 1 TABLET(175 MCG) BY MOUTH DAILY BEFORE BREAKFAST   [DISCONTINUED] Multiple Vitamin (MULTIVITAMIN) tablet Take 1 tablet by mouth daily. (Patient not taking: Reported on 06/27/2021)   [DISCONTINUED] pregabalin (LYRICA) 100 MG capsule Take 1 capsule (100 mg total) by mouth 3 (three) times daily. (Patient not taking: Reported on 06/26/2021)   [DISCONTINUED] Semaglutide,0.25 or 0.5MG /DOS, (OZEMPIC, 0.25 OR 0.5 MG/DOSE,) 2 MG/1.5ML SOPN Inject 0.25 mg weekly for 2 weeks, then increase to 0.5 mg weekly (Patient not taking: Reported on 06/26/2021)   No facility-administered encounter medications on file as of 06/26/2021.     Lab Results  Component Value Date   WBC 6.7 06/26/2021   HGB 12.6 (L) 06/26/2021   HCT 37.6 (L) 06/26/2021   PLT 239.0 06/26/2021   GLUCOSE 137 (H) 06/26/2021   CHOL 219 (H) 06/26/2021   TRIG 332.0 (H) 06/26/2021   HDL 31.30 (L) 06/26/2021   LDLDIRECT 107.0 06/26/2021   LDLCALC 66 01/08/2021   ALT 6 06/26/2021   AST 11 06/26/2021  NA 142 06/26/2021   K 3.1 (L) 06/26/2021   CL 108 06/26/2021   CREATININE 3.91 (H) 06/26/2021   BUN 30 (H) 06/26/2021   CO2 26 06/26/2021   TSH 13.61 (H)  06/26/2021   INR 0.85 04/09/2018   HGBA1C 8.8 (H) 06/26/2021       Assessment & Plan:   Problem List Items Addressed This Visit     Anemia    Has seen hematology.  Recheck cbc. Not taking iron.       Relevant Medications   cyanocobalamin (,VITAMIN B-12,) 1000 MCG/ML injection   Other Relevant Orders   CBC with Differential/Platelet (Completed)   Ferritin (Completed)   B12 deficiency    Recheck B12.       Relevant Orders   Vitamin B12 (Completed)   Carotid artery disease (HCC)    Last carotid ultrasound - <50% bilaterally.  Discussed need to take statin and keep blood pressure under control.  Will need f/u carotid ultrasound.        Relevant Medications   amLODipine (NORVASC) 10 MG tablet   atorvastatin (LIPITOR) 80 MG tablet   CKD (chronic kidney disease) stage 4, GFR 15-29 ml/min (HCC)    Avoid antiinflammatories.  Followed by Dr Lanora Manis.  Overdue f/u.  Check metabolic panel.       Diabetes ([place])    Not checking sugars.  Not watching his diet.  Mainly eating one good meal per day.  Discussed diet and exercise. Discussed importance of better sugar control.  He is taking his insulin.  Refer to pt assistance as outlined.        Relevant Medications   atorvastatin (LIPITOR) 80 MG tablet   insulin glargine (LANTUS) 100 UNIT/ML injection   insulin lispro (HUMALOG) 100 UNIT/ML injection   Other Relevant Orders   Hemoglobin A1c (Completed)   Basic metabolic panel (Completed)   Essential hypertension - Primary    Off blood pressure medication.  Restarted hydralazine three days ago.  Blood pressures improved.  Still elevated.  Discussed importance of taking his medication and the need for better blood pressure control.  Discussed risk of stroke,etc.  Discussed Good Rx.  Referred to patient assistance.        Relevant Medications   amLODipine (NORVASC) 10 MG tablet   atorvastatin (LIPITOR) 80 MG tablet   Headache    Describes headache as outlined.  Blood pressure  elevated.  Has not been taking medication.  Back on hydralazine now.  Blood pressure coming down.  Headache better.  Dizziness better.  Discussed importance if getting back on and taking his medication. Discussed further w/up - including repeat scan (MRI).  Money is an issue.  No insurance.  Declines at this time.  Follow.        Relevant Medications   amLODipine (NORVASC) 10 MG tablet   pregabalin (LYRICA) 100 MG capsule   Hearing loss    Discussed acute hearing change and need for further w/up.  Will notify me when agreeable.       History of CVA (cerebrovascular accident)    Needs to take plavix and lipitor.  Has been taking plavix.  Restart lipitor.        Hyperlipidemia    Restart lipitor.  Low cholesterol diet and exercise.  Follow lipid panel and liver function tests.        Relevant Medications   amLODipine (NORVASC) 10 MG tablet   atorvastatin (LIPITOR) 80 MG tablet   Other Relevant Orders   Hepatic  function panel (Completed)   Lipid panel (Completed)   Hypothyroidism    Not taking his synthroid.  Restart.  Check tsh.       Relevant Medications   levothyroxine (SYNTHROID) 175 MCG tablet   Other Relevant Orders   TSH (Completed)   TSH   Neuropathy    Get him back on lyrica.  Follow.       Swelling of left lower extremity    Has seen [facility] - swelling was improved. Avoid increased sodium intake.  Elevate legs.       Type 2 diabetes mellitus, with long-term current use of insulin (HCC)   Relevant Medications   atorvastatin (LIPITOR) 80 MG tablet   insulin glargine (LANTUS) 100 UNIT/ML injection   insulin lispro (HUMALOG) 100 UNIT/ML injection   Other Relevant Orders   Hemoglobin A1c (Completed)   Basic metabolic panel (Completed)   Other Visit Diagnoses     Need for immunization against influenza       Relevant Orders   Flu Vaccine QUAD 10mo+IM (Fluarix, Fluzone & Alfiuria Quad PF) (Completed)        [name], MD

## 2021-06-26 NOTE — Telephone Encounter (Signed)
See note.  Please confirm pt aware of above and ok to send in the remainder of prescriptions to local pharmacy if needed.  See me if questions.

## 2021-06-26 NOTE — Chronic Care Management (AMB) (Signed)
Chronic Care Management CCM Pharmacy Note  06/26/2021 Name:  Gregory Crane MRN:  [id number] DOB:  04-Mar-1971  Subjective: Gregory Crane is an 50 y.o. year old male who is a primary patient of [name], MD.  The CCM team was consulted for assistance with disease management and care coordination needs.    Engaged with patient face to face for  medication access  for pharmacy case management and/or care coordination services.   Objective:  Medications Reviewed Today     Reviewed by Lars Masson, LPN (Licensed Practical Nurse) on 06/26/21 at 67  Med List Status: <None>   Medication Order Taking? Sig Documenting Provider Last Dose Status Informant  Accu-Chek FastClix Lancets MISC [id number]  USE TO CHECK BLOOD SUGAR THREE TIMES DAILY AS DIRECTED Leone Haven, MD  Active   amLODipine (NORVASC) 10 MG tablet [id number] No TAKE 1 TABLET(10 MG) BY MOUTH DAILY  Patient not taking: Reported on 06/26/2021   [name], MD Not Taking Active   aspirin (ASPIRIN LOW DOSE) 81 MG EC tablet [id number]  TAKE 1 TABLET(81 MG) BY MOUTH DAILY [name], MD  Active   atorvastatin (LIPITOR) 80 MG tablet [id number] No TAKE 1 TABLET(80 MG) BY MOUTH DAILY  Patient not taking: Reported on 06/26/2021   [name], MD Not Taking Active   clopidogrel (PLAVIX) 75 MG tablet [id number]  TAKE 1 TABLET(75 MG) BY MOUTH DAILY [name], MD  Active   cyanocobalamin (,VITAMIN B-12,) 1000 MCG/ML injection [id number] No INJECT 1 ML INTO THE SKIN ONCE A WEEK FOR 3 WEEKS THEN 1ML EVERY 30 DAYS  Patient not taking: Reported on 06/26/2021   [name], MD Not Taking Active   Ferrous Gluconate 256 (28 Fe) MG TABS [id number] No Take by mouth.  Patient not taking: No sig reported   [provider] Not Taking Active   furosemide (LASIX) 20 MG tablet [id number]  Take 80 mg by mouth daily. Per nephrology [provider]  Active   glucose blood (FREESTYLE LITE) test strip [id number]   Check blood sugars twice a day (Dx. 250.02) [name], MD  Active Spouse/Significant Other  insulin glargine (LANTUS) 100 UNIT/ML injection [id number]  Inject 0.13 mLs (13 Units total) into the skin daily. [name], MD  Active            Med Note [name], [name]   Wed May 09, 2021 10:38 AM) 10 units   insulin lispro (HUMALOG) 100 UNIT/ML injection [id number]  Inject 4-12 units three times daily before meals per sliding scale: If premeal sugar is <130, inject 4 units; if 131-180, inject 6 units; if 181-240, inject 8 units; if 241-300, inject 10 units, if  >300, inject 12 units (max daily dose 36 units) [name], MD  Active   Insulin Syringe-Needle U-100 (INSULIN SYRINGE .5CC/30GX5/16") 30G X 5/16" 0.5 ML MISC [id number]  USE AS DIRECTED WITH INSULIN [name], MD  Active   levothyroxine (SYNTHROID) 175 MCG tablet [id number]  TAKE 1 TABLET(175 MCG) BY MOUTH DAILY BEFORE Laveda Norman, MD  Active   Multiple Vitamin (MULTIVITAMIN) tablet [id number]  Take 1 tablet by mouth daily. [provider]  Active   pregabalin (LYRICA) 100 MG capsule [id number] No Take 1 capsule (100 mg total) by mouth 3 (three) times daily.  Patient not taking: Reported on 06/26/2021   [name], MD Not Taking Active            Med Note (Kashana Breach E  Wed May 09, 2021 10:39 AM) Taking BID  Semaglutide, 1 MG/DOSE, (OZEMPIC, 1 MG/DOSE,) 4 MG/3ML SOPN [id number] No Inject 1 mg into the skin once a week.  Patient not taking: Reported on 06/26/2021   [name], MD Not Taking Active   Semaglutide,0.25 or 0.5MG /DOS, (OZEMPIC, 0.25 OR 0.5 MG/DOSE,) 2 MG/1.5ML SOPN [id number] No Inject 0.25 mg weekly for 2 weeks, then increase to 0.5 mg weekly  Patient not taking: Reported on 06/26/2021   [name], MD Not Taking Active   Syringe/Needle, Disp, (SYRINGE 3CC/25GX1") 25G X 1" 3 ML MISC [id number] No Use as directed to administer b12 injections.  Patient not taking: Reported  on 06/26/2021   [name], MD Not Taking Active             Pertinent Labs:   Lab Results  Component Value Date   HGBA1C 9.9 (H) 01/08/2021   Lab Results  Component Value Date   CHOL 127 01/08/2021   HDL 28.90 (L) 01/08/2021   LDLCALC 66 01/08/2021   TRIG 159.0 (H) 01/08/2021   CHOLHDL 4 01/08/2021   Lab Results  Component Value Date   CREATININE 4.27 (H) 01/08/2021   BUN 71 (H) 01/08/2021   NA 139 01/08/2021   K 5.1 01/08/2021   CL 106 01/08/2021   CO2 24 01/08/2021     SDOH:  (Social Determinants of Health) assessments and interventions performed: Yes SDOH Interventions    Flowsheet Row Most Recent Value  SDOH Interventions   Financial Strain Interventions Other (Comment)  [medication management clinic]       CCM Care Plan  Review of patient past medical history, allergies, medications, health status, including review of consultants reports, laboratory and other test data, was performed as part of comprehensive evaluation and provision of chronic care management services.   Conditions to be addressed/monitored:  Hypertension, Hyperlipidemia, Diabetes, and Chronic Kidney Disease  Care Plan : Medication Management  Updates made by De Hollingshead, RPH-CPP since 06/26/2021 12:00 AM     Problem: Diabetes, CKD, chronic pain, HTN      Long-Range Goal: Disease Progression Prevention   Start Date: 10/06/2020  This Visit's Progress: On track  Recent Progress: On track  Priority: High  Note:   Current Barriers:  Unable to independently monitor therapeutic efficacy Suboptimal therapeutic regimen for diabetes, HTN  Pharmacist Clinical Goal(s):  Over the next 90 days, patient will achieve adherence to monitoring guidelines and medication adherence to achieve therapeutic efficacy through collaboration with PharmD and provider.   Interventions: 1:1 collaboration with [name], MD regarding development and update of comprehensive plan of care as  evidenced by provider attestation and co-signature Inter-disciplinary care team collaboration (see longitudinal plan of care) Comprehensive medication review performed; medication list updated in electronic medical record  SDOH: Discussed with patient in office, will connect with [facility]. Scripts sent today. Patient instructed to go over after appointment to fill out enrollment paperwork.   Diabetes: Uncontrolled; current treatment: Ozempic 1 mg weekly - has not been able to afford to pick up in several months per his report; Lantus 13 units units QPM, Humalog 4-12 units with meals per sliding scale (If premeal sugar is <130, inject 4 units; if 131-180, inject 6 units; if 181-240, inject 8 units; if 241-300, inject 10 units, if  >300, inject 12 units; though today reports that if blood sugar is in "high 100s or 200s" he takes 7-10 units and if lower takes 5 units Renal fx limits use of  metformin, SGLT2 Restart Ozempic per PCP   Hypertension complicated by CKD: Uncontrolled; current treatment: amlodipine 10 mg QAM, furosemide 80 mg  Home BP readings: patient has not checked recently.  Previously recommended to continue current regimen at this time and continue collaboration with nephrology.   Hyperlipidemia and ASCVD risk reduction: Controlled per last lipid panel; current treatment: atorvastatin 80 mg daily Antiplatelet regimen: aspirin 81 mg daily, clopidogrel 75 mg daily  Previously recommended to continue current regimen at this time, along with collaboration with cardiology.   Depression/Anxiety with neuropathy Moderately well managed; current treatment: pregabalin 100 mg BID-TID ; usually taking BID Denies sedation with pregabalin, even given kidney function Previously recommended to continue current regimen at this time along with PCP collaboration. [place] cannot fill controlled prescriptions.   Hypothyroidism: Uncontrolled but subsequent dose change; current  regimen: levothyroxine 175 mcg mcg Previously recommend to continue current regimen at this time. Recheck TSH when able  Supplements: Vitamin B12 injections monthly  Patient Goals/Self-Care Activities Over the next 90 days, patient will:  - take medications as prescribed check glucose at least 3 times daily, document, and provide at future appointments check blood pressure daily, document, and provide at future appointments  Follow Up Plan: Telephone follow up appointment with care management team member scheduled for: ~ 5 weeks       Plan: Telephone follow up appointment with care management team member scheduled for:  6 weeks  Catie [name], PharmD, [place], [place] Pharmacist [employer] at [employer] [phone]

## 2021-06-26 NOTE — Telephone Encounter (Signed)
Spoke with Christian C. at [employer].  Darrick Meigs verified that patient still has active prescription drug coverage.  Made patient aware that [facility] cannot provide medication assistance for patients with prescription drug coverage.  [facility] will need verification from [place] that policy has been cancelled.  [facility]

## 2021-06-26 NOTE — Patient Instructions (Signed)
Visit Information  Patient verbalizes understanding of instructions provided today and agrees to view in MyChart.      Plan: Telephone follow up appointment with care management team member scheduled for:  6 weeks  Catie Tristan Proto, PharmD, BCACP, CPP Clinical Pharmacist  [facility] [place] [phone]  

## 2021-06-27 ENCOUNTER — Other Ambulatory Visit: Payer: Self-pay

## 2021-06-27 ENCOUNTER — Ambulatory Visit: Payer: PRIVATE HEALTH INSURANCE | Admitting: Pharmacist

## 2021-06-27 DIAGNOSIS — E785 Hyperlipidemia, unspecified: Secondary | ICD-10-CM

## 2021-06-27 DIAGNOSIS — I10 Essential (primary) hypertension: Secondary | ICD-10-CM

## 2021-06-27 DIAGNOSIS — N184 Chronic kidney disease, stage 4 (severe): Secondary | ICD-10-CM

## 2021-06-27 DIAGNOSIS — Z794 Long term (current) use of insulin: Secondary | ICD-10-CM

## 2021-06-27 NOTE — Chronic Care Management (AMB) (Signed)
Chronic Care Management CCM Pharmacy Note  06/27/2021 Name:  Gregory Crane MRN:  [id number] DOB:  10-15-1970  Subjective: Gregory Crane is an 50 y.o. year old male who is a primary patient of [name], MD.  The CCM team was consulted for assistance with disease management and care coordination needs.    Engaged with patient by telephone for follow up visit for pharmacy case management and/or care coordination services.   Objective:  Medications Reviewed Today     Reviewed by Lars Masson, LPN (Licensed Practical Nurse) on 06/26/21 at 23  Med List Status: <None>   Medication Order Taking? Sig Documenting Provider Last Dose Status Informant  Accu-Chek FastClix Lancets MISC [id number]  USE TO CHECK BLOOD SUGAR THREE TIMES DAILY AS DIRECTED Leone Haven, MD  Active   amLODipine (NORVASC) 10 MG tablet [id number] No TAKE 1 TABLET(10 MG) BY MOUTH DAILY  Patient not taking: Reported on 06/26/2021   [name], MD Not Taking Active   aspirin (ASPIRIN LOW DOSE) 81 MG EC tablet [id number]  TAKE 1 TABLET(81 MG) BY MOUTH DAILY [name], MD  Active   atorvastatin (LIPITOR) 80 MG tablet [id number] No TAKE 1 TABLET(80 MG) BY MOUTH DAILY  Patient not taking: Reported on 06/26/2021   [name], MD Not Taking Active   clopidogrel (PLAVIX) 75 MG tablet [id number]  TAKE 1 TABLET(75 MG) BY MOUTH DAILY [name], MD  Active   cyanocobalamin (,VITAMIN B-12,) 1000 MCG/ML injection [id number] No INJECT 1 ML INTO THE SKIN ONCE A WEEK FOR 3 WEEKS THEN 1ML EVERY 30 DAYS  Patient not taking: Reported on 06/26/2021   [name], MD Not Taking Active   Ferrous Gluconate 256 (28 Fe) MG TABS [id number] No Take by mouth.  Patient not taking: No sig reported   [provider] Not Taking Active   furosemide (LASIX) 20 MG tablet [id number]  Take 80 mg by mouth daily. Per nephrology [provider]  Active   glucose blood (FREESTYLE LITE) test strip [id number]   Check blood sugars twice a day (Dx. 250.02) [name], MD  Active Spouse/Significant Other  insulin glargine (LANTUS) 100 UNIT/ML injection [id number]  Inject 0.13 mLs (13 Units total) into the skin daily. [name], MD  Active            Med Note [name], [name]   Wed May 09, 2021 10:38 AM) 10 units   insulin lispro (HUMALOG) 100 UNIT/ML injection [id number]  Inject 4-12 units three times daily before meals per sliding scale: If premeal sugar is <130, inject 4 units; if 131-180, inject 6 units; if 181-240, inject 8 units; if 241-300, inject 10 units, if  >300, inject 12 units (max daily dose 36 units) [name], MD  Active   Insulin Syringe-Needle U-100 (INSULIN SYRINGE .5CC/30GX5/16") 30G X 5/16" 0.5 ML MISC [id number]  USE AS DIRECTED WITH INSULIN [name], MD  Active   levothyroxine (SYNTHROID) 175 MCG tablet [id number]  TAKE 1 TABLET(175 MCG) BY MOUTH DAILY BEFORE Laveda Norman, MD  Active   Multiple Vitamin (MULTIVITAMIN) tablet [id number]  Take 1 tablet by mouth daily. [provider]  Active   pregabalin (LYRICA) 100 MG capsule [id number] No Take 1 capsule (100 mg total) by mouth 3 (three) times daily.  Patient not taking: Reported on 06/26/2021   [name], MD Not Taking Active            Med Note [name], Lavonna Rua May 09, 2021 10:39 AM) Taking BID  Semaglutide, 1 MG/DOSE, (OZEMPIC, 1 MG/DOSE,) 4 MG/3ML SOPN [id number] No Inject 1 mg into the skin once a week.  Patient not taking: Reported on 06/26/2021   [name], MD Not Taking Active   Semaglutide,0.25 or 0.5MG /DOS, (OZEMPIC, 0.25 OR 0.5 MG/DOSE,) 2 MG/1.5ML SOPN [id number] No Inject 0.25 mg weekly for 2 weeks, then increase to 0.5 mg weekly  Patient not taking: Reported on 06/26/2021   [name], MD Not Taking Active   Syringe/Needle, Disp, (SYRINGE 3CC/25GX1") 25G X 1" 3 ML MISC [id number] No Use as directed to administer b12 injections.  Patient not taking: Reported  on 06/26/2021   [name], MD Not Taking Active             Pertinent Labs:   Lab Results  Component Value Date   HGBA1C 8.8 (H) 06/26/2021   Lab Results  Component Value Date   CHOL 219 (H) 06/26/2021   HDL 31.30 (L) 06/26/2021   LDLCALC 66 01/08/2021   LDLDIRECT 107.0 06/26/2021   TRIG 332.0 (H) 06/26/2021   CHOLHDL 7 06/26/2021   Lab Results  Component Value Date   CREATININE 3.91 (H) 06/26/2021   BUN 30 (H) 06/26/2021   NA 142 06/26/2021   K 3.1 (L) 06/26/2021   CL 108 06/26/2021   CO2 26 06/26/2021     SDOH:  (Social Determinants of Health) assessments and interventions performed: Yes   CCM Care Plan  Review of patient past medical history, allergies, medications, health status, including review of consultants reports, laboratory and other test data, was performed as part of comprehensive evaluation and provision of chronic care management services.   Conditions to be addressed/monitored:  Hypertension, Hyperlipidemia, and Diabetes  Care Plan : Medication Management  Updates made by De Hollingshead, RPH-CPP since 06/27/2021 12:00 AM     Problem: Diabetes, CKD, chronic pain, HTN      Long-Range Goal: Disease Progression Prevention   Start Date: 10/06/2020  Recent Progress: On track  Priority: High  Note:   Current Barriers:  Unable to independently monitor therapeutic efficacy Suboptimal therapeutic regimen for diabetes, HTN  Pharmacist Clinical Goal(s):  Over the next 90 days, patient will achieve adherence to monitoring guidelines and medication adherence to achieve therapeutic efficacy through collaboration with PharmD and provider.   Interventions: 1:1 collaboration with [name], MD regarding development and update of comprehensive plan of care as evidenced by provider attestation and co-signature Inter-disciplinary care team collaboration (see longitudinal plan of care) Comprehensive medication review performed; medication list  updated in electronic medical record  SDOH: Discussed with patient in office, will connect with [facility]. However, his insurance plan is still showing as active, through [employer] cannot fill anything under it. He will call the insurance to determine if plan is active or receive documentation to provide to [facility] that insurance has ended.   Diabetes: Uncontrolled; current treatment: Ozempic 1 mg weekly - has not been able to afford to pick up in several months per his report; Lantus 13 units units QPM, Humalog 4-12 units with meals per sliding scale (If premeal sugar is <130, inject 4 units; if 131-180, inject 6 units; if 181-240, inject 8 units; if 241-300, inject 10 units, if  >300, inject 12 units; though today reports that if blood sugar is in "high 100s or 200s" he takes 7-10 units and if lower takes 5 units Renal fx limits use of metformin, SGLT2 Restart Ozempic per PCP.  Reports today  that he has a supply of Ozempic 0.25/0.5 mg pens, Humalog, and Lantus.    Hypertension complicated by CKD: Uncontrolled; current treatment: amlodipine 10 mg QAM, furosemide 80 mg  Home BP readings: patient has not checked recently.  Previously recommended to continue current regimen at this time and continue collaboration with nephrology.  Reports today that he is out of amlodipine. Will collaborate w/ [facility] to determine if they can give a short supply of this medication.   Hyperlipidemia and ASCVD risk reduction: Controlled per last lipid panel; current treatment: atorvastatin 80 mg daily Antiplatelet regimen: aspirin 81 mg daily, clopidogrel 75 mg daily  Previously recommended to continue current regimen at this time, along with collaboration with cardiology.  Patient notes he is out of atorvastatin. Will collaborate w/ [facility] to determine if they can give a short supply of this medication.   Depression/Anxiety with neuropathy Moderately well managed; current treatment: pregabalin 100 mg  BID-TID ; usually taking BID Denies sedation with pregabalin, even given kidney function Previously recommended to continue current regimen at this time along with PCP collaboration. Reports he picked up pregabalin w/ goodRx card yesterday  Hypothyroidism: Uncontrolled but subsequent dose change; current regimen: levothyroxine 175 mcg mcg Previously recommend to continue current regimen at this time. Recheck TSH when able  Supplements: Vitamin B12 injections monthly  Patient Goals/Self-Care Activities Over the next 90 days, patient will:  - take medications as prescribed check glucose at least 3 times daily, document, and provide at future appointments check blood pressure daily, document, and provide at future appointments  Follow Up Plan: Telephone follow up appointment with care management team member scheduled for: ~ 5 weeks       Plan: Telephone follow up appointment with care management team member scheduled for:  5 weeks  Catie [name], PharmD, [place], CPP Clinical Pharmacist [employer] [facility] [employer] [phone]

## 2021-06-27 NOTE — Patient Instructions (Signed)
Visit Information  Patient verbalizes understanding of instructions provided today and agrees to view in [place].   Plan: Telephone follow up appointment with care management team member scheduled for:  5 weeks  Catie [name], PharmD, [place], [place] Clinical Pharmacist [employer] [facility] [employer] [phone]

## 2021-06-27 NOTE — Telephone Encounter (Signed)
Called patient he states he is going to call insurance and get letter proving he has insurance once received

## 2021-06-28 ENCOUNTER — Other Ambulatory Visit: Payer: Self-pay

## 2021-06-28 ENCOUNTER — Ambulatory Visit: Payer: PRIVATE HEALTH INSURANCE | Admitting: Pharmacy Technician

## 2021-06-28 DIAGNOSIS — Z79899 Other long term (current) drug therapy: Secondary | ICD-10-CM

## 2021-06-28 NOTE — Progress Notes (Signed)
Patient presented to obtain medication assistance.  However, patient has active prescription drug coverage with [facility].  Explained to patient that [facility] cannot provide medication assistance until prescription drug coverage is terminated.  Patient stated that he thought insurance ended in February 2022.  Assisted patient with contacting [facility].  Rose at [employer] verified patient still has active prescription drug coverage.  Provided [place] with ID# [id number], BIN # [id number], PCN# [id number] & RX# [id number].  Informed patient that he could take this information to [facility] and obtain medications.  Patient stated that he did not want to use insurance, because this was a COBRA plan from his deceased wife that he thought expired in February 2022.  Therefore, he has not been using the plan or paying the monthly premiums for the plan. Concerned that if he uses the plan, he may be billed for monthly premiums that he cannot afford.  Patient doesn't understand why the plan is still active.  Assisted patient with contacting [facility] benefits department [phone].  Spoke with Caryl Pina.  Caryl Pina stated that she did not have access to a lot of the information for this particular plan because it was a COBRA plan.  Caryl Pina did a conference call with [employer], the company that [employer] benefits for [employer].  We were connected with Rhodhiss from [employer].  Conya stated that plan with [employer] and [facility] are still active because patient needed to provide a letter stating that he wished for both plans to be cancelled.  Conya provided fax number where letter needed to be submitted, [phone].  Assisted patient with composing the termination letter and patient gave permission for letter to be faxed to [employer].  Conya stated that she would elevate his request so that he would receive a confirmation within 10 business days showing both the medical and prescription drug coverage have been terminated.    Patient stated that he is  in need of Amlodipine, Atorvastatin & Clopidogrel.  Provided patient with coupons from [facility] to obtain these medications from [place].  [facility] coupon allows patient to purchase a 30 day supply of Amlodipine for $7, Atorvastatin for $6.15, and Clopidogrel for $18.  Patient stated that he has been paying for the provider and medication expenses during the period while he was insured.  Told patient to contact the providers and pharmacy where he had obtained medications and ask if they would bill his insurance for those costs.  Patient stated that he no longer has the [employer] card.  When he thought the policy ended, he threw the card away.  Patient is going to contact [employer] at [phone] to inquire about having another card mailed to him.  Scheduled patient to return to complete paperwork for [facility].  Also, made patient aware of the financial information that he needs to bring with him to the appointment.  Patient stated that he is working with an attorney to obtain [place].  Was told by Social Security that his claim was closed, but sent to another department within [employer] for review.  Patient hopes to find out soon what the status of his application is.  Referred patient to DSS to apply for food stamps.  Provided patient with information about 2-1-1 and food pantries.  [place] [facility]

## 2021-06-29 ENCOUNTER — Telehealth: Payer: Self-pay

## 2021-06-29 NOTE — Telephone Encounter (Signed)
-----   Message from [name], MD sent at 06/27/2021 11:02 PM EDT ----- Notify - cholesterol has increased.  He needs to get back on his cholesterol medication.  If unable to get through medication assistance, he can use [facility] to see if can get cheaper.  TSH is elevated, but he has been off his medication.  Needs to restart synthroid. Recheck tsh in 6 weeks.  Potassium is low.  Is he taking lasix?  If so, how often is he taking?  GFR - 17 (improved some from our last check).  Needs f/u with his nephrologist.  A1c 8.8.  hgb improved - 12.6.  liver function tests wnl.

## 2021-07-02 ENCOUNTER — Other Ambulatory Visit: Payer: Self-pay

## 2021-07-03 ENCOUNTER — Other Ambulatory Visit: Payer: Self-pay

## 2021-07-04 ENCOUNTER — Other Ambulatory Visit: Payer: Self-pay

## 2021-07-05 ENCOUNTER — Other Ambulatory Visit: Payer: Self-pay

## 2021-07-06 ENCOUNTER — Ambulatory Visit: Payer: PRIVATE HEALTH INSURANCE | Admitting: Pharmacy Technician

## 2021-07-06 DIAGNOSIS — Z79899 Other long term (current) drug therapy: Secondary | ICD-10-CM

## 2021-07-07 ENCOUNTER — Encounter: Payer: Self-pay | Admitting: Internal Medicine

## 2021-07-07 ENCOUNTER — Telehealth: Payer: Self-pay | Admitting: Internal Medicine

## 2021-07-07 DIAGNOSIS — R519 Headache, unspecified: Secondary | ICD-10-CM | POA: Insufficient documentation

## 2021-07-07 DIAGNOSIS — H9190 Unspecified hearing loss, unspecified ear: Secondary | ICD-10-CM | POA: Insufficient documentation

## 2021-07-07 NOTE — Telephone Encounter (Signed)
Recent evaluated.  Was not taking his medication.  Was referred to pt assistance and given Good [facility] card.  Need to confirm he is now taking his medication and doing ok.

## 2021-07-07 NOTE — Assessment & Plan Note (Signed)
Off blood pressure medication.  Restarted hydralazine three days ago.  Blood pressures improved.  Still elevated.  Discussed importance of taking his medication and the need for better blood pressure control.  Discussed risk of stroke,etc.  Discussed [facility].  Referred to patient assistance.

## 2021-07-07 NOTE — Assessment & Plan Note (Signed)
Recheck B12 

## 2021-07-07 NOTE — Assessment & Plan Note (Signed)
Discussed acute hearing change and need for further w/up.  Will notify me when agreeable.

## 2021-07-07 NOTE — Assessment & Plan Note (Signed)
Last carotid ultrasound - <50% bilaterally.  Discussed need to take statin and keep blood pressure under control.  Will need f/u carotid ultrasound.

## 2021-07-07 NOTE — Assessment & Plan Note (Signed)
Has seen hematology.  Recheck cbc. Not taking iron.

## 2021-07-07 NOTE — Assessment & Plan Note (Signed)
Not taking his synthroid.  Restart.  Check tsh.

## 2021-07-07 NOTE — Assessment & Plan Note (Signed)
Has seen [facility] - swelling was improved. Avoid increased sodium intake.  Elevate legs.

## 2021-07-07 NOTE — Assessment & Plan Note (Signed)
Restart lipitor.  Low cholesterol diet and exercise.  Follow lipid panel and liver function tests.

## 2021-07-07 NOTE — Assessment & Plan Note (Signed)
Needs to take plavix and lipitor.  Has been taking plavix.  Restart lipitor.

## 2021-07-07 NOTE — Assessment & Plan Note (Signed)
Get him back on lyrica.  Follow.

## 2021-07-07 NOTE — Assessment & Plan Note (Signed)
Not checking sugars.  Not watching his diet.  Mainly eating one good meal per day.  Discussed diet and exercise. Discussed importance of better sugar control.  He is taking his insulin.  Refer to pt assistance as outlined.

## 2021-07-07 NOTE — Assessment & Plan Note (Signed)
Describes headache as outlined.  Blood pressure elevated.  Has not been taking medication.  Back on hydralazine now.  Blood pressure coming down.  Headache better.  Dizziness better.  Discussed importance if getting back on and taking his medication. Discussed further w/up - including repeat scan (MRI).  Money is an issue.  No insurance.  Declines at this time.  Follow.

## 2021-07-07 NOTE — Assessment & Plan Note (Signed)
Avoid antiinflammatories.  Followed by Dr Lanora Manis.  Overdue f/u.  Check metabolic panel.

## 2021-07-09 ENCOUNTER — Other Ambulatory Visit: Payer: Self-pay

## 2021-07-10 ENCOUNTER — Other Ambulatory Visit: Payer: Self-pay

## 2021-07-10 NOTE — Progress Notes (Signed)
Completed [facility] application and contract.  Patient agreed to all terms of the [facility] contract.    Patient still to provide 2021 Federal Tax Return.    Provided patient with Civil engineer, contracting based on his particular needs.    Ozempic & Lantus Prescription Applications completed with patient.  Forwarded to Dr. [name] for signature.  Upon receipt of signed applications from provider and 2021 Federal Tax Return from patient, Ozempic Prescription Application will be submitted to [employer] and Lantus Prescription Application will be submitted to [employer].  Patient stated that his [place] bill was in arrears.  Assisted patient with contacting Ramond Marrow at [facility] regarding applying for utility assistance.  Unable to reach.  Left message for Sharyn Lull to call patient.  Assisted patient with the completion of application to apply for food stamps.  Patient to take application to DSS.  [place] [facility]

## 2021-07-10 NOTE — Telephone Encounter (Signed)
Patient stated that he was able to get his meds and BP is trending down. Feeling much better. Ear is some better but still having some decreased hearing. Are we doing referral to ENT? (Pt mentioned it to me as well as an MRI) I can place referral just wanted to confirm with you first.

## 2021-07-10 NOTE — Addendum Note (Signed)
Addended by: Alisa Graff on: 07/10/2021 04:51 PM   Modules accepted: Orders

## 2021-07-10 NOTE — Telephone Encounter (Signed)
Order placed for ENT referral.  Please confirm with pt ok for referral.

## 2021-07-11 LAB — CUP PACEART REMOTE DEVICE CHECK
Date Time Interrogation Session: 20221114023035
Implantable Pulse Generator Implant Date: 20190604

## 2021-07-11 NOTE — Telephone Encounter (Signed)
I called and informed the patient that the referral for ENT was placed and they will call the patient to schedule and he understood.  Kal Chait,cma

## 2021-07-16 ENCOUNTER — Ambulatory Visit (INDEPENDENT_AMBULATORY_CARE_PROVIDER_SITE_OTHER): Payer: No Typology Code available for payment source

## 2021-07-16 DIAGNOSIS — I639 Cerebral infarction, unspecified: Secondary | ICD-10-CM

## 2021-07-24 NOTE — Progress Notes (Signed)
[facility] Summary Report / Loop Recorder 

## 2021-07-26 ENCOUNTER — Other Ambulatory Visit: Payer: Self-pay | Admitting: Internal Medicine

## 2021-07-30 ENCOUNTER — Telehealth: Payer: Self-pay | Admitting: Pharmacist

## 2021-07-30 NOTE — Telephone Encounter (Signed)
07/30/2021 4:04:19 PM - request to pat. to sign page 1 & needt [phone]  -- Gregory Crane - Monday, July 30, 2021 4:01 PM --I have received the signed provider portion of [employer] for [place] for Ozempic--I see that there was a section of page 1 that patient did not sign, mailing to patient to sign, date and return, Also need 2021 tax return before we can order the meds-patient was told to bring tax return 07-09-2021.

## 2021-08-10 ENCOUNTER — Other Ambulatory Visit: Payer: PRIVATE HEALTH INSURANCE

## 2021-08-11 DIAGNOSIS — H9191 Unspecified hearing loss, right ear: Secondary | ICD-10-CM | POA: Insufficient documentation

## 2021-08-14 LAB — CUP PACEART REMOTE DEVICE CHECK
Date Time Interrogation Session: 20221217023609
Implantable Pulse Generator Implant Date: 20190604

## 2021-08-21 ENCOUNTER — Ambulatory Visit (INDEPENDENT_AMBULATORY_CARE_PROVIDER_SITE_OTHER): Payer: No Typology Code available for payment source

## 2021-08-21 DIAGNOSIS — I639 Cerebral infarction, unspecified: Secondary | ICD-10-CM

## 2021-08-29 NOTE — Progress Notes (Signed)
[facility] Summary Report / Loop Recorder 

## 2021-09-21 ENCOUNTER — Ambulatory Visit (INDEPENDENT_AMBULATORY_CARE_PROVIDER_SITE_OTHER): Payer: Self-pay

## 2021-09-21 DIAGNOSIS — I639 Cerebral infarction, unspecified: Secondary | ICD-10-CM

## 2021-09-21 LAB — CUP PACEART REMOTE DEVICE CHECK
Date Time Interrogation Session: 20230126230437
Implantable Pulse Generator Implant Date: 20190604

## 2021-09-27 ENCOUNTER — Ambulatory Visit: Payer: Self-pay | Admitting: Podiatry

## 2021-10-01 ENCOUNTER — Telehealth: Payer: Self-pay

## 2021-10-01 NOTE — Telephone Encounter (Signed)
ILR reached RRT 09/29/21.  Attempted to contact patient to advise. No answer, LMTCB.

## 2021-10-01 NOTE — Progress Notes (Signed)
[facility] Summary Report / Loop Recorder 

## 2021-10-02 ENCOUNTER — Ambulatory Visit: Payer: PRIVATE HEALTH INSURANCE | Admitting: Internal Medicine

## 2021-10-03 NOTE — Telephone Encounter (Signed)
LVM for patient to call device clinic back also sent my chart message

## 2021-10-08 ENCOUNTER — Encounter: Payer: Self-pay | Admitting: Internal Medicine

## 2021-10-08 ENCOUNTER — Other Ambulatory Visit: Payer: Self-pay

## 2021-10-08 MED ORDER — PREGABALIN 100 MG PO CAPS: 100.0000 mg | ORAL_CAPSULE | Freq: Three times a day (TID) | ORAL | 0 refills | Status: DC

## 2021-10-08 MED ORDER — AMLODIPINE BESYLATE 10 MG PO TABS: ORAL_TABLET | ORAL | 0 refills | Status: DC

## 2021-10-08 NOTE — Telephone Encounter (Signed)
I am waiting on a call back about hydralazine but it will not let me fill his lyrica.

## 2021-10-08 NOTE — Telephone Encounter (Signed)
See note below

## 2021-10-08 NOTE — Telephone Encounter (Signed)
I spoke with the patient and he states he would like for the loop to be removed. I told him I will have the scheduler call him with an appointment.

## 2021-10-08 NOTE — Telephone Encounter (Signed)
He missed his last appt.  Ok to refill x 1, but he will need to keep appt for next refill.  Also, per your note, need to clarify medication he is taking.

## 2021-10-08 NOTE — Telephone Encounter (Signed)
LMTCB. Refilled amlodipine but need to clarify his hydralazine. Do not have on med list. Also needs refill on lyrica. Last refill 06/26/21

## 2021-10-08 NOTE — Telephone Encounter (Signed)
I refilled his lyrica x 1 month.

## 2021-10-25 NOTE — Telephone Encounter (Signed)
Contacted pt several times. He has not called back to schedule ILR explant.  ?

## 2021-12-05 ENCOUNTER — Other Ambulatory Visit: Payer: Self-pay | Admitting: Internal Medicine

## 2021-12-05 NOTE — Telephone Encounter (Signed)
Rx ok'd for lyrica #90 with no refills.  Note to keep scheduled. appt ?

## 2021-12-06 ENCOUNTER — Encounter: Payer: Self-pay | Admitting: Podiatry

## 2021-12-06 ENCOUNTER — Ambulatory Visit (INDEPENDENT_AMBULATORY_CARE_PROVIDER_SITE_OTHER): Payer: Medicaid Other | Admitting: Podiatry

## 2021-12-06 DIAGNOSIS — B351 Tinea unguium: Secondary | ICD-10-CM | POA: Diagnosis not present

## 2021-12-06 DIAGNOSIS — E0843 Diabetes mellitus due to underlying condition with diabetic autonomic (poly)neuropathy: Secondary | ICD-10-CM

## 2021-12-06 DIAGNOSIS — N184 Chronic kidney disease, stage 4 (severe): Secondary | ICD-10-CM

## 2021-12-06 DIAGNOSIS — M79675 Pain in left toe(s): Secondary | ICD-10-CM | POA: Diagnosis not present

## 2021-12-06 DIAGNOSIS — N179 Acute kidney failure, unspecified: Secondary | ICD-10-CM

## 2021-12-06 DIAGNOSIS — I890 Lymphedema, not elsewhere classified: Secondary | ICD-10-CM

## 2021-12-06 DIAGNOSIS — M79674 Pain in right toe(s): Secondary | ICD-10-CM

## 2021-12-06 NOTE — Progress Notes (Signed)
This patient returns to my office for at risk foot care.  This patient requires this care by a professional since this patient will be at risk due to having neuropathy,  CKD and diabetes.  This patient is unable to cut nails himself since the patient cannot reach his nails.These nails are painful walking and wearing shoes.  This patient presents for at risk foot care today. ? ?General Appearance  Alert, conversant and in no acute stress. ? ?Vascular  Dorsalis pedis and posterior tibial  pulses are palpable  bilaterally.  Capillary return is within normal limits  bilaterally. Temperature is within normal limits  bilaterally. ? ?Neurologic  Senn-Weinstein monofilament wire test within normal limits  bilaterally. Muscle power within normal limits bilaterally. ? ?Nails Thick disfigured discolored nails with subungual debris  from hallux to fifth toes bilaterally. No evidence of bacterial infection or drainage bilaterally. ? ?Orthopedic  No limitations of motion  feet .  No crepitus or effusions noted.  No bony pathology or digital deformities noted. ? ?Skin  normotropic skin with no porokeratosis noted bilaterally.  No signs of infections or ulcers noted.  Multiple fissure open wounds on back of right heel.  No infection or drainage. Healing  ulcer ? ?Onychomycosis  Pain in right toes  Pain in left toes  ? ?Consent was obtained for treatment procedures.   Mechanical debridement of nails 1-5  bilaterally performed with a nail nipper.  Filed with dremel without incident. ? ? ?Return office visit 3 months for preventative foot care services.                   Told patient to return for periodic foot care and evaluation due to potential at risk complications. ? ? ?Gardiner Barefoot DPM  ?

## 2021-12-12 ENCOUNTER — Encounter: Payer: Self-pay | Admitting: Emergency Medicine

## 2021-12-12 ENCOUNTER — Other Ambulatory Visit: Payer: Self-pay

## 2021-12-12 ENCOUNTER — Emergency Department: Payer: Medicaid Other

## 2021-12-12 ENCOUNTER — Emergency Department
Admission: EM | Admit: 2021-12-12 | Discharge: 2021-12-13 | Disposition: A | Discharge status: Home / Self Care | Payer: Medicaid Other | Attending: Emergency Medicine | Admitting: Emergency Medicine

## 2021-12-12 DIAGNOSIS — E1165 Type 2 diabetes mellitus with hyperglycemia: Secondary | ICD-10-CM | POA: Insufficient documentation

## 2021-12-12 DIAGNOSIS — R102 Pelvic and perineal pain: Secondary | ICD-10-CM | POA: Diagnosis present

## 2021-12-12 DIAGNOSIS — L0231 Cutaneous abscess of buttock: Secondary | ICD-10-CM | POA: Insufficient documentation

## 2021-12-12 DIAGNOSIS — E1122 Type 2 diabetes mellitus with diabetic chronic kidney disease: Secondary | ICD-10-CM | POA: Diagnosis not present

## 2021-12-12 DIAGNOSIS — N189 Chronic kidney disease, unspecified: Secondary | ICD-10-CM | POA: Diagnosis not present

## 2021-12-12 DIAGNOSIS — L03317 Cellulitis of buttock: Secondary | ICD-10-CM | POA: Insufficient documentation

## 2021-12-12 LAB — CBC
HCT: 29.6 % — ABNORMAL LOW (ref 39.0–52.0)
Hemoglobin: 9.8 g/dL — ABNORMAL LOW (ref 13.0–17.0)
MCH: 28.3 pg (ref 26.0–34.0)
MCHC: 33.1 g/dL (ref 30.0–36.0)
MCV: 85.5 fL (ref 80.0–100.0)
Platelets: 272 10*3/uL (ref 150–400)
RBC: 3.46 MIL/uL — ABNORMAL LOW (ref 4.22–5.81)
RDW: 12.6 % (ref 11.5–15.5)
WBC: 8.1 10*3/uL (ref 4.0–10.5)
nRBC: 0 % (ref 0.0–0.2)

## 2021-12-12 LAB — BASIC METABOLIC PANEL
Anion gap: 13 (ref 5–15)
BUN: 73 mg/dL — ABNORMAL HIGH (ref 6–20)
CO2: 25 mmol/L (ref 22–32)
Calcium: 8.5 mg/dL — ABNORMAL LOW (ref 8.9–10.3)
Chloride: 96 mmol/L — ABNORMAL LOW (ref 98–111)
Creatinine, Ser: 4.79 mg/dL — ABNORMAL HIGH (ref 0.61–1.24)
GFR, Estimated: 14 mL/min — ABNORMAL LOW (ref >60)
Glucose, Bld: 308 mg/dL — ABNORMAL HIGH (ref 70–99)
Potassium: 3.4 mmol/L — ABNORMAL LOW (ref 3.5–5.1)
Sodium: 134 mmol/L — ABNORMAL LOW (ref 135–145)

## 2021-12-12 LAB — CBG MONITORING, ED: Glucose-Capillary: 295 mg/dL — ABNORMAL HIGH (ref 70–99)

## 2021-12-12 MED ORDER — SODIUM CHLORIDE 0.9 % IV BOLUS
1000.0000 mL | Freq: Once | INTRAVENOUS | Status: AC
  Administered 2021-12-12: 1000 mL via INTRAVENOUS

## 2021-12-12 MED ORDER — DOXYCYCLINE HYCLATE 100 MG PO CAPS: 100.0000 mg | ORAL_CAPSULE | Freq: Two times a day (BID) | ORAL | 0 refills | Status: AC

## 2021-12-12 MED ORDER — LIDOCAINE-EPINEPHRINE 2 %-1:100000 IJ SOLN
20.0000 mL | Freq: Once | INTRAMUSCULAR | Status: AC
  Administered 2021-12-12: 20 mL via INTRADERMAL
  Filled 2021-12-12: qty 1

## 2021-12-12 MED ORDER — SODIUM CHLORIDE 0.9 % IV SOLN
2.0000 g | Freq: Once | INTRAVENOUS | Status: AC
  Administered 2021-12-12: 2 g via INTRAVENOUS
  Filled 2021-12-12: qty 20

## 2021-12-12 MED ORDER — ONDANSETRON HCL 4 MG PO TABS: 4.0000 mg | ORAL_TABLET | Freq: Three times a day (TID) | ORAL | 0 refills | Status: DC | PRN

## 2021-12-12 MED ORDER — VANCOMYCIN HCL 1500 MG/300ML IV SOLN
1500.0000 mg | Freq: Once | INTRAVENOUS | Status: AC
  Administered 2021-12-12: 1500 mg via INTRAVENOUS
  Filled 2021-12-12: qty 300

## 2021-12-12 MED ORDER — VANCOMYCIN HCL IN DEXTROSE 1-5 GM/200ML-% IV SOLN
1000.0000 mg | Freq: Once | INTRAVENOUS | Status: AC
Start: 2021-12-12 — End: 2021-12-13
  Administered 2021-12-13: 1000 mg via INTRAVENOUS
  Filled 2021-12-12: qty 200

## 2021-12-12 MED ORDER — SULFAMETHOXAZOLE-TRIMETHOPRIM 800-160 MG PO TABS: 1.0000 | ORAL_TABLET | Freq: Two times a day (BID) | ORAL | 0 refills | Status: DC

## 2021-12-12 MED ORDER — HYDROCODONE-ACETAMINOPHEN 5-325 MG PO TABS: 1.0000 | ORAL_TABLET | Freq: Four times a day (QID) | ORAL | 0 refills | Status: DC | PRN

## 2021-12-12 MED ORDER — ONDANSETRON HCL 4 MG/2ML IJ SOLN
4.0000 mg | Freq: Once | INTRAMUSCULAR | Status: AC
  Administered 2021-12-12: 4 mg via INTRAVENOUS
  Filled 2021-12-12: qty 2

## 2021-12-12 MED ORDER — CEFDINIR 300 MG PO CAPS: 300.0000 mg | ORAL_CAPSULE | Freq: Every day | ORAL | 0 refills | Status: AC

## 2021-12-12 MED ORDER — CEPHALEXIN 500 MG PO CAPS: 500.0000 mg | ORAL_CAPSULE | Freq: Three times a day (TID) | ORAL | 0 refills | Status: DC

## 2021-12-12 MED ORDER — HYDROMORPHONE HCL 1 MG/ML IJ SOLN
1.0000 mg | Freq: Once | INTRAMUSCULAR | Status: AC
  Administered 2021-12-12: 1 mg via INTRAVENOUS
  Filled 2021-12-12: qty 1

## 2021-12-12 NOTE — ED Provider Notes (Signed)
? ?[facility] ?Provider Note ? ? ? Event Date/Time  ? First MD Initiated Contact with Patient 12/12/21 2046   ?  (approximate) ? ? ?History  ? ?Abscess and Hyperglycemia ? ? ?HPI ? ?Gregory Crane is a 51 y.o. male  with h/o CKD, DM, here with perineal abscess.  Patient states that for the last 3 to 4 days, he has had progressively worsening pain, swelling, and redness in his right perineal area behind his scrotum.  States it began as a small area of redness and has progressively worsened.  He states the pain is worse with movement and palpation.  No fevers or chills.  No nausea or vomiting.  Denies history of previous abscesses.  He states his blood sugars have actually been well controlled, so he was surprised to see that his blood sugar was elevated at urgent care today.  He was sent here for further evaluation.  No recent antibiotic use.  No trauma. ? ?  ? ? ?Physical Exam  ? ?Triage Vital Signs: ?ED Triage Vitals [12/12/21 1809]  ?Enc Vitals Group  ?   BP (!) 147/63  ?   Pulse Rate 89  ?   Resp 16  ?   Temp 98.4 ?F (36.9 ?C)  ?   Temp Source Oral  ?   SpO2 96 %  ?   Weight 276 lb 14.4 oz (125.6 kg)  ?   Height 6' (1.829 m)  ?   Head Circumference   ?   Peak Flow   ?   Pain Score 3  ?   Pain Loc   ?   Pain Edu?   ?   Excl. in [place]?   ? ? ?Most recent vital signs: ?Vitals:  ? 12/12/21 2200 12/12/21 2300  ?BP: (!) 154/71 (!) 166/71  ?Pulse: 74 87  ?Resp: 18 18  ?Temp:    ?SpO2: 97% 94%  ? ? ? ?General: Awake, no distress.  ?CV:  Good peripheral perfusion.  ?Resp:  Normal effort.  ?Abd:  No distention.  No tenderness.  No rebound or guarding. ?Other:  Approximately 4 x 3 cm, ovoid area of skin induration in the right perineal area between the scrotum and anus.  This does not extend to the scrotum or perirectal area.  There is no crepitance.  There is a small superficial ulceration, without overt drainage.  See image. ? ? ? ?ED Results / Procedures / Treatments  ? ?Labs ?(all labs ordered are  listed, but only abnormal results are displayed) ?Labs Reviewed  ?BASIC METABOLIC PANEL - Abnormal; Notable for the following components:  ?    Result Value  ? Sodium 134 (*)   ? Potassium 3.4 (*)   ? Chloride 96 (*)   ? Glucose, Bld 308 (*)   ? BUN 73 (*)   ? Creatinine, Ser 4.79 (*)   ? Calcium 8.5 (*)   ? GFR, Estimated 14 (*)   ? All other components within normal limits  ?CBC - Abnormal; Notable for the following components:  ? RBC 3.46 (*)   ? Hemoglobin 9.8 (*)   ? HCT 29.6 (*)   ? All other components within normal limits  ?CBG MONITORING, ED - Abnormal; Notable for the following components:  ? Glucose-Capillary 295 (*)   ? All other components within normal limits  ?URINALYSIS, ROUTINE W REFLEX MICROSCOPIC  ?CBG MONITORING, ED  ? ? ? ?EKG ? ? ? ?RADIOLOGY ?CT scan: No fluid collection  or abscess ? ? ?I also independently reviewed and agree wit radiologist interpretations. ? ? ?PROCEDURES: ? ?Critical Care performed: No ? ?Marland Kitchen.Incision and Drainage ? ?Date/Time: 12/12/2021 11:58 PM ?Performed by: Duffy Bruce, MD ?Authorized by: Duffy Bruce, MD  ? ?Consent:  ?  Consent obtained:  Verbal ?  Consent given by:  Patient ?  Risks discussed:  Bleeding, damage to other organs, incomplete drainage, infection and pain ?  Alternatives discussed:  Alternative treatment and delayed treatment ?Universal protocol:  ?  Immediately prior to procedure, a time out was called: yes   ?  Patient identity confirmed:  Verbally with patient ?Location:  ?  Type:  Abscess ?  Size:  4 x 3 ?  Location:  Anogenital ?  Anogenital location: Perineal. ?Pre-procedure details:  ?  Skin preparation:  Betadine ?Anesthesia:  ?  Anesthesia method:  Local infiltration ?  Local anesthetic:  Lidocaine 1% WITH epi ?Procedure type:  ?  Complexity:  Simple ?Procedure details:  ?  Ultrasound guidance: yes   ?  Incision types:  Single straight ?  Incision depth:  Dermal ?  Wound management:  Probed and deloculated and irrigated with saline ?   Drainage:  Purulent ?  Drainage amount:  Scant ?  Wound treatment:  Wound left open ?  Packing materials:  1/2 in iodoform gauze ?Post-procedure details:  ?  Procedure completion:  Tolerated well, no immediate complications ? ? ? ?MEDICATIONS ORDERED IN ED: ?Medications  ?vancomycin (VANCOREADY) IVPB 1500 mg/300 mL (1,500 mg Intravenous New Bag/Given 12/12/21 2322)  ?  Followed by  ?vancomycin (VANCOCIN) IVPB 1000 mg/200 mL premix (has no administration in time range)  ?lidocaine-EPINEPHrine (XYLOCAINE W/EPI) 2 %-1:100000 (with pres) injection 20 mL (20 mLs Intradermal Given by Other 12/12/21 2214)  ?sodium chloride 0.9 % bolus 1,000 mL (0 mLs Intravenous Stopped 12/12/21 2300)  ?cefTRIAXone (ROCEPHIN) 2 g in sodium chloride 0.9 % 100 mL IVPB (0 g Intravenous Stopped 12/12/21 2245)  ?HYDROmorphone (DILAUDID) injection 1 mg (1 mg Intravenous Given 12/12/21 2218)  ?ondansetron [facility]) injection 4 mg (4 mg Intravenous Given 12/12/21 2218)  ? ? ? ?IMPRESSION / MDM / ASSESSMENT AND PLAN / ED COURSE  ?I reviewed the triage vital signs and the nursing notes. ?             ?               ? ? ? ?MDM:  ?51 year old male with history of diabetes, obesity, here with perineal cellulitis/abscess.  Exam as above, is consistent with focal area of cellulitis, possibly with abscess.  He has a central area that appears to be a possible follicle.  He has no surrounding crepitance and does not appear toxic or ill-appearing, do not suspect Fournier's.  That being said, he is diabetic and obese, so CT scan was obtained.  I reviewed the CT scan.  While the area is not necessarily apparent, he has no streaking or deep extension of the infection, no evidence to suggest necrotizing infection.  He has no leukocytosis, fever, tachycardia, or signs of Sirs/sepsis.  BMP shows baseline CKD, possible mild dehydration for which she has been given fluids.  Glucose is 308.  Following discussion and informed consent, incision and drainage performed.   Patient was given Banken Rocephin here and will be discharged on broad-spectrum antibiotics.  I discussed strict return precautions.  Patient is in agreement with this plan. ? ? ?MEDICATIONS GIVEN IN ED: ?Medications  ?vancomycin (VANCOREADY) IVPB 1500 mg/300  mL (1,500 mg Intravenous New Bag/Given 12/12/21 2322)  ?  Followed by  ?vancomycin (VANCOCIN) IVPB 1000 mg/200 mL premix (has no administration in time range)  ?lidocaine-EPINEPHrine (XYLOCAINE W/EPI) 2 %-1:100000 (with pres) injection 20 mL (20 mLs Intradermal Given by Other 12/12/21 2214)  ?sodium chloride 0.9 % bolus 1,000 mL (0 mLs Intravenous Stopped 12/12/21 2300)  ?cefTRIAXone (ROCEPHIN) 2 g in sodium chloride 0.9 % 100 mL IVPB (0 g Intravenous Stopped 12/12/21 2245)  ?HYDROmorphone (DILAUDID) injection 1 mg (1 mg Intravenous Given 12/12/21 2218)  ?ondansetron [facility]) injection 4 mg (4 mg Intravenous Given 12/12/21 2218)  ? ? ? ?Consults:  ? ? ? ?EMR reviewed  ?Urgent care note from today, with referral to ED.  Outpatient cardiology notes reviewed. ? ? ? ? ?FINAL CLINICAL IMPRESSION(S) / ED DIAGNOSES  ? ?Final diagnoses:  ?Abscess and cellulitis of gluteal region  ? ? ? ?Rx / DC Orders  ? ?ED Discharge Orders   ? ?      Ordered  ?  ondansetron (ZOFRAN) 4 MG tablet  Every 8 hours PRN       ? 12/12/21 2335  ?  cephALEXin (KEFLEX) 500 MG capsule  3 times daily,   Status:  Discontinued       ? 12/12/21 2335  ?  sulfamethoxazole-trimethoprim (BACTRIM DS) 800-160 MG tablet  2 times daily,   Status:  Discontinued       ? 12/12/21 2335  ?  HYDROcodone-acetaminophen (NORCO/VICODIN) 5-325 MG tablet  Every 6 hours PRN       ? 12/12/21 2336  ?  doxycycline (VIBRAMYCIN) 100 MG capsule  2 times daily       ? 12/12/21 2342  ?  cefdinir (OMNICEF) 300 MG capsule  Daily       ? 12/12/21 2342  ? ?  ?  ? ?  ? ? ? ?Note:  This document was prepared using Dragon voice recognition software and may include unintentional dictation errors. ?  ?Duffy Bruce, MD ?12/13/21 0001 ? ?

## 2021-12-12 NOTE — ED Notes (Addendum)
Patient transported to CT 

## 2021-12-12 NOTE — Consult Note (Signed)
PHARMACY -  BRIEF ANTIBIOTIC NOTE  ? ?Pharmacy has received consult(s) for cellulitis  from an ED provider.  The patient's profile has been reviewed for ht/wt/allergies/indication/available labs.   ? ?One time order(s) placed for vancomycin ? ?Further antibiotics/pharmacy consults should be ordered by admitting physician if indicated.       ?                ?Thank you, ?Oswald Hillock ?12/12/2021  9:31 PM ? ?

## 2021-12-12 NOTE — Discharge Instructions (Addendum)
TAKE THE ANTIBIOTICS AS PRESCRIBED: ?- CEFDINIR + DOXYCYLINE ? ?Remove the packing in 2 days if it has not fallen out yet ? ?I'd recommend warm compresses to the area 3-4x daily for the next several days ? ?Take the antibiotics as prescribed ? ?Monitor for redness, drainage, or increased swelling and return if any worsen after 24 hours of antibiotics ? ?

## 2021-12-12 NOTE — ED Triage Notes (Signed)
Pt comes into the ED via POV c/o hyperglycemia and perineal abscess.  Pt states the abscess has been there for 4-5 days and is getting worse.  Pt came from [facility] where thy checked his sugar and it read over 600.  Pt ambulatory and in NAD at this time.  ?

## 2021-12-12 NOTE — ED Notes (Signed)
[facility]. Pt is in NAD.  ?

## 2021-12-17 ENCOUNTER — Encounter: Payer: Self-pay | Admitting: Internal Medicine

## 2021-12-19 ENCOUNTER — Telehealth: Payer: Self-pay | Admitting: Internal Medicine

## 2021-12-19 NOTE — Telephone Encounter (Signed)
Patient has medicaid and needs a referral to see his eye doctor. [facility] in [place], Dr. Kerin Ransom.  ?

## 2021-12-21 NOTE — Telephone Encounter (Signed)
[facility] was calling for referral for the patient. ?

## 2021-12-24 ENCOUNTER — Ambulatory Visit: Payer: Self-pay | Admitting: Internal Medicine

## 2021-12-24 ENCOUNTER — Other Ambulatory Visit: Payer: Self-pay

## 2021-12-24 DIAGNOSIS — Z0100 Encounter for examination of eyes and vision without abnormal findings: Secondary | ICD-10-CM

## 2021-12-24 NOTE — Telephone Encounter (Signed)
LM that referral to Dr. Kerin Ransom was placed and if any issues with referral please give [place] a call back.  ?

## 2021-12-26 ENCOUNTER — Encounter: Payer: Medicaid Other | Admitting: Internal Medicine

## 2021-12-26 ENCOUNTER — Other Ambulatory Visit: Payer: Self-pay | Admitting: Internal Medicine

## 2021-12-26 NOTE — Telephone Encounter (Signed)
Rx okd for lyrica #[id number] with no refills.   ?

## 2021-12-28 ENCOUNTER — Other Ambulatory Visit: Payer: Self-pay

## 2022-01-10 ENCOUNTER — Other Ambulatory Visit: Payer: Self-pay | Admitting: Internal Medicine

## 2022-01-11 ENCOUNTER — Other Ambulatory Visit: Payer: Self-pay | Admitting: Internal Medicine

## 2022-01-13 DIAGNOSIS — E08319 Diabetes mellitus due to underlying condition with unspecified diabetic retinopathy without macular edema: Secondary | ICD-10-CM | POA: Insufficient documentation

## 2022-01-13 DIAGNOSIS — Z992 Dependence on renal dialysis: Secondary | ICD-10-CM | POA: Insufficient documentation

## 2022-01-13 DIAGNOSIS — R079 Chest pain, unspecified: Secondary | ICD-10-CM | POA: Insufficient documentation

## 2022-01-18 ENCOUNTER — Telehealth: Payer: Self-pay | Admitting: Internal Medicine

## 2022-01-18 NOTE — Telephone Encounter (Signed)
Patient has been scheduled for a hospital follow on 01/23/2022. Patient is being release on 01/18/2022 .

## 2022-01-22 ENCOUNTER — Telehealth: Payer: Self-pay

## 2022-01-22 NOTE — Telephone Encounter (Signed)
Patient states he noticed on MyChart that his appointment was moved from 01/24/2022 to 01/23/2022.  Patient states he has to be in [place] tomorrow morning to have his heart monitor removed and is concerned about being able to make his appointment with Dr. [name].  I let patient know that I see a note saying the appointment was changed because Dr. Nicki Reaper would like to see the patient sooner and she currently doesn't have anything else available.  Patient states he will do his best to make the appointment and will call us if he sees that he won't be able to make it on time.

## 2022-01-22 NOTE — Progress Notes (Unsigned)
      Patient Care Team: [name], MD as PCP - General (Internal Medicine)   HPI  Gregory Crane is a 51 y.o. 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  After back-and-forth discussions with neurology was elected to proceed recommending a loop recorder implantation and undertaken 5/19  Records and Results Reviewed***  Past Medical History:  Diagnosis Date   Allergy    Anemia    Bell's palsy    Diabetes mellitus without complication ([place])    diet controlled   Hypertension    Hypothyroidism    Kidney stones    Pseudotumor cerebri    Stroke [facility])     Past Surgical History:  Procedure Laterality Date   COLONOSCOPY WITH PROPOFOL N/A 03/30/2020   Procedure: COLONOSCOPY WITH PROPOFOL;  Surgeon: Lesly Rubenstein, MD;  Location: [facility] ENDOSCOPY;  Service: Endoscopy;  Laterality: N/A;   LOOP RECORDER INSERTION N/A 01/27/2018   Procedure: LOOP RECORDER INSERTION;  Surgeon: [name], MD;  Location: [facility];  Service: Cardiovascular;  Laterality: N/A;   LUMBAR PUNCTURE     as child   NO PAST SURGERIES     TEE WITHOUT CARDIOVERSION N/A 01/07/2018   Procedure: TRANSESOPHAGEAL ECHOCARDIOGRAM (TEE);  Surgeon: Minna Merritts, MD;  Location: [facility] ORS;  Service: Cardiovascular;  Laterality: N/A;    No outpatient medications have been marked as taking for the 01/23/22 encounter (Appointment) with [name], MD.    No Known Allergies    Review of Systems negative except from HPI and PMH  Physical Exam There were no vitals taken for this visit. Well developed and well nourished in no acute distress HENT normal E scleral and icterus clear Neck Supple JVP flat; carotids brisk and full Clear to ausculation {CARD RHYTHM:10874} ***Regular rate and rhythm, no murmurs gallops or rub Soft with active bowel sounds No clubbing cyanosis {Numbers; edema:17696}  Edema Alert and oriented, grossly normal motor and sensory function Skin Warm and Dry  ECG ***  CrCl cannot be calculated (Patient's most recent lab result is older than the maximum 21 days allowed.).   Assessment and  Plan       Current medicines are reviewed at length with the patient today .  The patient does not*** have concerns regarding medicines.

## 2022-01-22 NOTE — Telephone Encounter (Signed)
Transition Care Management Unsuccessful Follow-up Telephone Call  Date of discharge and from where:  01/18/22 [facility]  Attempts:  1st Attempt  Reason for unsuccessful TCM follow-up call:  Left voice message. Patient scheduled with PCP for HFU on 01/23/22 at 11:00. Keep all scheduled appointments. No further outreach at this time. TCM qualified.

## 2022-01-23 ENCOUNTER — Ambulatory Visit (INDEPENDENT_AMBULATORY_CARE_PROVIDER_SITE_OTHER): Payer: Medicaid Other | Admitting: Internal Medicine

## 2022-01-23 ENCOUNTER — Encounter: Payer: Self-pay | Admitting: Internal Medicine

## 2022-01-23 VITALS — BP 104/58 | HR 98 | Ht 72.0 in | Wt 253.2 lb

## 2022-01-23 DIAGNOSIS — N186 End stage renal disease: Secondary | ICD-10-CM | POA: Diagnosis not present

## 2022-01-23 DIAGNOSIS — I10 Essential (primary) hypertension: Secondary | ICD-10-CM

## 2022-01-23 DIAGNOSIS — E1122 Type 2 diabetes mellitus with diabetic chronic kidney disease: Secondary | ICD-10-CM | POA: Diagnosis not present

## 2022-01-23 DIAGNOSIS — D649 Anemia, unspecified: Secondary | ICD-10-CM | POA: Diagnosis not present

## 2022-01-23 DIAGNOSIS — Z86010 Personal history of colonic polyps: Secondary | ICD-10-CM

## 2022-01-23 DIAGNOSIS — Z9889 Other specified postprocedural states: Secondary | ICD-10-CM | POA: Diagnosis not present

## 2022-01-23 DIAGNOSIS — Z794 Long term (current) use of insulin: Secondary | ICD-10-CM

## 2022-01-23 DIAGNOSIS — I639 Cerebral infarction, unspecified: Secondary | ICD-10-CM | POA: Diagnosis not present

## 2022-01-23 DIAGNOSIS — R531 Weakness: Secondary | ICD-10-CM | POA: Diagnosis not present

## 2022-01-23 DIAGNOSIS — Z95818 Presence of other cardiac implants and grafts: Secondary | ICD-10-CM

## 2022-01-23 DIAGNOSIS — I779 Disorder of arteries and arterioles, unspecified: Secondary | ICD-10-CM

## 2022-01-23 DIAGNOSIS — N183 Chronic kidney disease, stage 3 unspecified: Secondary | ICD-10-CM

## 2022-01-23 DIAGNOSIS — E039 Hypothyroidism, unspecified: Secondary | ICD-10-CM

## 2022-01-23 DIAGNOSIS — G629 Polyneuropathy, unspecified: Secondary | ICD-10-CM

## 2022-01-23 DIAGNOSIS — Z8673 Personal history of transient ischemic attack (TIA), and cerebral infarction without residual deficits: Secondary | ICD-10-CM

## 2022-01-23 DIAGNOSIS — E785 Hyperlipidemia, unspecified: Secondary | ICD-10-CM

## 2022-01-23 DIAGNOSIS — E11319 Type 2 diabetes mellitus with unspecified diabetic retinopathy without macular edema: Secondary | ICD-10-CM

## 2022-01-23 NOTE — Progress Notes (Signed)
Patient ID: Gregory Crane, male   DOB: [birth year], 51 y.o.   MRN: [id number]   Subjective:    Patient ID: Gregory Crane, male    DOB: 12-Feb-1971, 51 y.o.   MRN: [id number]   Patient here for hospital follow up.   HPI Accompanied by his sister.  History obtained from both of them.  Admitted 01/12/22 - 01/18/22 - chest pain, poor balance and weakness.  Ruled out for ACS with negative troponins.  ECHO showed small to moderate size circumferential pericardial effusion without evidence of HD compromise, new from prior echo 08/2020.  Ruled out for PE with normal VQ.  Bilateral lower extremity ultrasound - negative for DVT. 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  Improved and resolved with HD.  Progression of CKD to HD.  Receiving dialysis T, Th and Saturday.  Acute hypoxic respiratory failure resolved.  Has OSA - CPAP.  Insulin adjusted while in the hospital.  Lyrica dose adjusted.  Swelling significantly improved.  Found to be anemic.  Given IV iron in hospital.  Continues on oral iron.  Had colonoscopy 2021 recommended 6 month f/u.  Overdue.  States since being home, he continues to remain weak.  Has to crawl upstairs to his bed.  Discussed home health.  Agreeable to home health PT.  No chest pain.  Breathing better.  Swelling better.  Blood sugar this am 197.  Needs refill insulin.  Blood pressure doing well.    Past Medical History:  Diagnosis Date   Allergy    Anemia    Bell's palsy    Diabetes mellitus without complication ([place])    diet controlled   Hypertension    Hypothyroidism    Kidney stones    Pseudotumor cerebri    Stroke [facility])    Past Surgical History:  Procedure Laterality Date   COLONOSCOPY WITH PROPOFOL N/A 03/30/2020   Procedure: COLONOSCOPY WITH PROPOFOL;  Surgeon: Lesly Rubenstein, MD;  Location: [facility] ENDOSCOPY;  Service: Endoscopy;  Laterality: N/A;   LOOP RECORDER INSERTION N/A 01/27/2018   Procedure: LOOP RECORDER INSERTION;  Surgeon: [name], MD;   Location: [place] CV LAB;  Service: Cardiovascular;  Laterality: N/A;   LUMBAR PUNCTURE     as child   NO PAST SURGERIES     TEE WITHOUT CARDIOVERSION N/A 01/07/2018   Procedure: TRANSESOPHAGEAL ECHOCARDIOGRAM (TEE);  Surgeon: Minna Merritts, MD;  Location: [facility] ORS;  Service: Cardiovascular;  Laterality: N/A;   Family History  Problem Relation Age of Onset   Breast cancer Mother    Diabetes Father    Diabetes Sister    Arthritis Maternal Grandmother    Diabetes Maternal Grandmother    Social History   Socioeconomic History   Marital status: Widowed    Spouse name: [name]   Number of children: 2   Years of education: Not on file   Highest education level: Not on file  Occupational History   Not on file  Tobacco Use   Smoking status: Never   Smokeless tobacco: Never  Vaping Use   Vaping Use: Never used  Substance and Sexual Activity   Alcohol use: No    Alcohol/week: 0.0 standard drinks    Comment: very rarely (1-2/year)   Drug use: No   Sexual activity: Yes  Other Topics Concern   Not on file  Social History Narrative   Lives at home with wife, independent at baseline.   Social Determinants of Health   Financial Resource Strain: High  Risk   Difficulty of Paying Living Expenses: Hard  Food Insecurity: Not on file  Transportation Needs: Not on file  Physical Activity: Not on file  Stress: Not on file  Social Connections: Not on file     Review of Systems  Constitutional:  Positive for fatigue. Negative for fever.  HENT:  Negative for congestion and sinus pressure.   Respiratory:  Negative for cough and chest tightness.        Breathing has improved.   Cardiovascular:  Negative for chest pain and palpitations.       Leg swelling improved.   Gastrointestinal:  Negative for abdominal pain, diarrhea, nausea and vomiting.  Genitourinary:  Negative for difficulty urinating and dysuria.  Musculoskeletal:  Negative for joint swelling and myalgias.  Skin:   Negative for color change and rash.  Neurological:  Negative for dizziness and headaches.  Psychiatric/Behavioral:  Negative for agitation and dysphoric mood.       Objective:     BP 124/68 (BP Location: Right Arm, Patient Position: Sitting, Cuff Size: Large)   Pulse 92   Temp 98.5 F (36.9 C) (Temporal)   Resp 12   Ht '5\' 10"'$  (1.778 m)   Wt 264 lb (119.7 kg)   SpO2 97%   BMI 37.88 kg/m  Wt Readings from Last 3 Encounters:  01/23/22 264 lb (119.7 kg)  01/23/22 253 lb 3.2 oz (114.9 kg)  12/12/21 276 lb 14.4 oz (125.6 kg)    Physical Exam Constitutional:      General: He is not in acute distress.    Appearance: Normal appearance. He is well-developed.  HENT:     Head: Normocephalic and atraumatic.     Right Ear: External ear normal.     Left Ear: External ear normal.  Eyes:     General: No scleral icterus.       Right eye: No discharge.        Left eye: No discharge.  Cardiovascular:     Rate and Rhythm: Normal rate and regular rhythm.  Pulmonary:     Effort: Pulmonary effort is normal. No respiratory distress.     Breath sounds: Normal breath sounds.  Abdominal:     General: Bowel sounds are normal.     Palpations: Abdomen is soft.     Tenderness: There is no abdominal tenderness.  Musculoskeletal:        General: No tenderness.     Cervical back: Neck supple. No tenderness.     Comments: Lower extremity swelling improved.   Lymphadenopathy:     Cervical: No cervical adenopathy.  Skin:    Findings: No erythema or rash.  Neurological:     Mental Status: He is alert.  Psychiatric:        Mood and Affect: Mood normal.        Behavior: Behavior normal.     Outpatient Encounter Medications as of 01/23/2022  Medication Sig   Accu-Chek FastClix Lancets MISC USE TO CHECK BLOOD SUGAR THREE TIMES DAILY AS DIRECTED   aspirin (ASPIRIN LOW DOSE) 81 MG EC tablet TAKE 1 TABLET(81 MG) BY MOUTH DAILY   atorvastatin (LIPITOR) 80 MG tablet TAKE 1 TABLET(80 MG) BY MOUTH ONCE  DAILY.   cyanocobalamin (,VITAMIN B-12,) 1000 MCG/ML injection INJECT 1 ML INTO THE MUSCLE ONCE A WEEK FOR 3 WEEKS. THEN 1ML EVERY 30 DAYS   glucose blood (FREESTYLE LITE) test strip Check blood sugars twice a day (Dx. 250.02)   hydrALAZINE (APRESOLINE) 50 MG tablet TAKE  1 TABLET(50 MG) BY MOUTH THREE TIMES DAILY   insulin glargine (LANTUS) 100 UNIT/ML injection Inject 0.13 mLs (13 Units total) into the skin once daily.   insulin lispro (HUMALOG) 100 UNIT/ML injection Inject 4-12 units three times daily before meals per sliding scale: If premeal sugar is <130, inject 4 units; if 131-180, inject 6 units; if 181-240, inject 8 units; if 241-300, inject 10 units, if  >300, inject 12 units (max daily dose 36 units)   Insulin Syringe-Needle U-100 (INSULIN SYRINGE .5CC/30GX5/16") 30G X 5/16" 0.5 ML MISC USE AS DIRECTED WITH INSULIN   levothyroxine (SYNTHROID) 175 MCG tablet TAKE 1 TABLET (175 MCG) BY MOUTH ONCE DAILY BEFORE BREAKFAST.   pregabalin (LYRICA) 100 MG capsule TAKE 1 CAPSULE(100 MG) BY MOUTH THREE TIMES DAILY   ROPINIROLE HCL PO Take by mouth.   Semaglutide,0.25 or 0.'5MG'$ /DOS, (OZEMPIC, 0.25 OR 0.5 MG/DOSE,) 2 MG/1.5ML SOPN Inject 0.25 mg weekly for 2 weeks, then increase to 0.5 mg weekly   Syringe/Needle, Disp, (SYRINGE 3CC/25GX1") 25G X 1" 3 ML MISC Use as directed to administer b12 injections.   [DISCONTINUED] amLODipine (NORVASC) 10 MG tablet TAKE 1 TABLET(10 MG) BY MOUTH DAILY (Patient not taking: Reported on 01/23/2022)   [DISCONTINUED] clopidogrel (PLAVIX) 75 MG tablet TAKE 1 TABLET(75 MG) BY MOUTH DAILY (Patient not taking: Reported on 01/23/2022)   [DISCONTINUED] furosemide (LASIX) 20 MG tablet Take 80 mg by mouth daily. Per nephrology (Patient not taking: Reported on 01/23/2022)   [DISCONTINUED] HYDROcodone-acetaminophen (NORCO/VICODIN) 5-325 MG tablet Take 1-2 tablets by mouth every 6 (six) hours as needed for moderate pain or severe pain. (Patient not taking: Reported on 01/23/2022)    [DISCONTINUED] ondansetron (ZOFRAN) 4 MG tablet Take 1 tablet (4 mg total) by mouth every 8 (eight) hours as needed for nausea or vomiting. (Patient not taking: Reported on 01/23/2022)   [DISCONTINUED] Semaglutide, 1 MG/DOSE, (OZEMPIC, 1 MG/DOSE,) 4 MG/3ML SOPN Inject 1 mg into the skin once a week. (Patient not taking: Reported on 01/23/2022)   No facility-administered encounter medications on file as of 01/23/2022.     Lab Results  Component Value Date   WBC 8.1 12/12/2021   HGB 9.8 (L) 12/12/2021   HCT 29.6 (L) 12/12/2021   PLT 272 12/12/2021   GLUCOSE 308 (H) 12/12/2021   CHOL 219 (H) 06/26/2021   TRIG 332.0 (H) 06/26/2021   HDL 31.30 (L) 06/26/2021   LDLDIRECT 107.0 06/26/2021   LDLCALC 66 01/08/2021   ALT 6 06/26/2021   AST 11 06/26/2021   NA 134 (L) 12/12/2021   K 3.4 (L) 12/12/2021   CL 96 (L) 12/12/2021   CREATININE 4.79 (H) 12/12/2021   BUN 73 (H) 12/12/2021   CO2 25 12/12/2021   TSH 13.61 (H) 06/26/2021   INR 0.85 04/09/2018   HGBA1C 8.8 (H) 06/26/2021    CT PELVIS WO CONTRAST  Result Date: 12/12/2021 CLINICAL DATA:  Perianal abscess. EXAM: CT PELVIS WITHOUT CONTRAST TECHNIQUE: Multidetector CT imaging of the pelvis was performed following the standard protocol without intravenous contrast. RADIATION DOSE REDUCTION: This exam was performed according to the departmental dose-optimization program which includes automated exposure control, adjustment of the mA and/or kV according to patient size and/or use of iterative reconstruction technique. COMPARISON:  None. FINDINGS: Urinary Tract:  No abnormality visualized. Bowel:  Unremarkable visualized pelvic bowel loops. Vascular/Lymphatic: Mild aortoiliac atherosclerotic disease. No pelvic adenopathy. Reproductive: The prostate and seminal vesicles are grossly unremarkable. Other: Mild thickening of the scrotal wall. No fluid collection or abscess. Musculoskeletal: Mild degenerative  changes. No acute osseous pathology. IMPRESSION:  No fluid collection or abscess. Aortic Atherosclerosis (ICD10-I70.0). Electronically Signed   By: Anner Crete M.D.   On: 12/12/2021 22:15       Assessment & Plan:   Problem List Items Addressed This Visit     Anemia    Has seen hematology.  Received IV iron in hospital.  On oral iron now.  Will need f/u cbc and iron studies.        Relevant Orders   Ambulatory referral to Home Health   Carotid artery disease ([place])    Last carotid ultrasound - <50% bilaterally.  Discussed need to take statin and keep blood pressure under control.  Will need f/u carotid ultrasound.         Diabetes ([place])    Insulin adjusted in hospital.  Will continue discharge dosing for now.  Follow sugars.  May need to adjust once in his home routine.        Relevant Orders   Ambulatory referral to [place]   ESRD (end stage renal disease) ([place])    Receiving HD now - T,Th and Saturday.  Will see if can have labs drawn through HD.        Relevant Orders   Ambulatory referral to [place] hypertension    Off amlodipine, metolazone and lasix.  Blood pressure doing well. Follow pressures.  Follow metabolic panel.        History of colon polyps    Colonoscopy 03/2020 - multiple polyps.  Recommended f/u in 6 months.  Overdue.  Will need to schedule f/u.        History of CVA (cerebrovascular accident)    On aspirin now.  plavix stopped in the hospital.         Relevant Orders   Ambulatory referral to Home Health   History of loop recorder    Removed today.        Hyperlipidemia    On lipitor.  Low cholesterol diet and exercise.  Follow lipid panel and liver function tests.        Hypothyroidism    On synthroid now.  Will need f/u tsh.         Neuropathy    lyrica dose adjusted.         Relevant Orders   Ambulatory referral to Home Health   Type 2 diabetes mellitus, with long-term current use of insulin (H[facility])    Insulin dosing adjusted in hospital.  Continue  discharge dose for now.  Follow sugars.  Adjust as needed.        Relevant Orders   Ambulatory referral to Home Health   Weakness    Weakness and unsteadiness.  Neuropathy.  Recent hospitalization.  Anemic.  ESRD.  Arrange home health - nursing, PT.           [name], MD

## 2022-01-23 NOTE — Patient Instructions (Addendum)
Medication Instructions:  Your physician recommends that you continue on your current medications as directed. Please refer to the Current Medication list given to you today.  *If you need a refill on your cardiac medications before your next appointment, please call your pharmacy*   Lab Work: none  If you have labs (blood work) drawn today and your tests are completely normal, you will receive your results only by: [place] (if you have MyChart) OR A paper copy in the mail If you have any lab test that is abnormal or we need to change your treatment, we will call you to review the results.   Testing/Procedures: Removal of Internal Loop Recorder today.   Follow-Up: At [facility], you and your health needs are our priority.  As part of our continuing mission to provide you with exceptional heart care, we have created designated Provider Care Teams.  These Care Teams include your primary Cardiologist (physician) and Advanced Practice Providers (APPs -  Physician Assistants and Nurse Practitioners) who all work together to provide you with the care you need, when you need it.  We recommend signing up for the patient portal called "MyChart".  Sign up information is provided on this After Visit Summary.  MyChart is used to connect with patients for Virtual Visits (Telemedicine).  Patients are able to view lab/test results, encounter notes, upcoming appointments, etc.  Non-urgent messages can be sent to your provider as well.   To learn more about what you can do with MyChart, go to [email or web address].    Your next appointment:   Follow up with Dr Caryl Comes as needed1}    Other Instructions   Implantable Loop Recorder Removal, Care After Refer to this sheet in the next few weeks. These instructions provide you with information about caring for yourself after your procedure. Your health care provider may also give you more specific instructions. Your treatment has been planned  according to current medical practices, but problems sometimes occur. Call your health care provider if you have any problems or questions after your procedure.  What can I expect after the procedure? After the procedure, it is common to have: Soreness or pain near the cut from surgery (incision). Some swelling or bruising near the incision.  Follow these instructions at home:  Medicines Take over-the-counter and prescription medicines only as told by your health care provider. If you were prescribed an antibiotic medicine, take it as told by your health care provider. Do not stop taking the antibiotic even if you start to feel better.  Bathing Do not take baths, swim, or use a hot tub until your health care provider approves. You may bathe on Friday after removal of your monitor.  Incision care Follow instructions from your health care provider about how to take care of your incision. Make sure you: Remove your top dressing on Friday (before you shower) Leave stitches (sutures), skin glue, or adhesive strips in place. These skin closures may need to stay in place for 2 weeks or longer. If adhesive strip edges start to loosen and curl up, you may trim the loose edges. Do not remove adhesive strips completely unless your health care provider tells you to do that. Check your incision area every day for signs of infection. Check for: More redness, swelling, or pain. Fluid or blood. Warmth. Pus or a bad smell.  Contact a health care provider if: You have more redness, swelling, or pain around your incision. You have more fluid or blood coming from  your incision. Your incision feels warm to the touch. You have pus or a bad smell coming from your incision. You have a fever. You have pain that is not relieved by your pain medicine.   Important Information About Sugar

## 2022-01-24 ENCOUNTER — Ambulatory Visit: Payer: Self-pay | Admitting: Internal Medicine

## 2022-01-27 ENCOUNTER — Encounter: Payer: Self-pay | Admitting: Internal Medicine

## 2022-01-27 ENCOUNTER — Telehealth: Payer: Self-pay | Admitting: Internal Medicine

## 2022-01-27 NOTE — Assessment & Plan Note (Signed)
Weakness and unsteadiness.  Neuropathy.  Recent hospitalization.  Anemic.  ESRD.  Arrange home health - nursing, PT.

## 2022-01-27 NOTE — Assessment & Plan Note (Signed)
Off amlodipine, metolazone and lasix.  Blood pressure doing well. Follow pressures.  Follow metabolic panel.

## 2022-01-27 NOTE — Assessment & Plan Note (Signed)
Receiving HD now - T,Th and Saturday.  Will see if can have labs drawn through HD.

## 2022-01-27 NOTE — Assessment & Plan Note (Signed)
lyrica dose adjusted.

## 2022-01-27 NOTE — Assessment & Plan Note (Signed)
Last carotid ultrasound - <50% bilaterally.  Discussed need to take statin and keep blood pressure under control.  Will need f/u carotid ultrasound.

## 2022-01-27 NOTE — Assessment & Plan Note (Signed)
Removed today.

## 2022-01-27 NOTE — Assessment & Plan Note (Signed)
On lipitor.  Low cholesterol diet and exercise.  Follow lipid panel and liver function tests.   

## 2022-01-27 NOTE — Assessment & Plan Note (Signed)
On aspirin now.  plavix stopped in the hospital.

## 2022-01-27 NOTE — Assessment & Plan Note (Signed)
Insulin adjusted in hospital.  Will continue discharge dosing for now.  Follow sugars.  May need to adjust once in his home routine.

## 2022-01-27 NOTE — Assessment & Plan Note (Signed)
Has seen hematology.  Received IV iron in hospital.  On oral iron now.  Will need f/u cbc and iron studies.

## 2022-01-27 NOTE — Assessment & Plan Note (Signed)
On synthroid now.  Will need f/u tsh.   

## 2022-01-27 NOTE — Assessment & Plan Note (Signed)
Insulin dosing adjusted in hospital.  Continue discharge dose for now.  Follow sugars.  Adjust as needed.

## 2022-01-27 NOTE — Assessment & Plan Note (Signed)
Colonoscopy 03/2020 - multiple polyps.  Recommended f/u in 6 months.  Overdue.  Will need to schedule f/u.  

## 2022-01-27 NOTE — Telephone Encounter (Signed)
Please contact [facility] and see where he is getting his dialysis.  Is he getting locally.  See if he has a phone number to contact them.  We need to be able to contact them to get labs drawn when at dialysis.

## 2022-01-30 NOTE — Telephone Encounter (Signed)
Lm for pt to cb re: where is he getting dialuysis

## 2022-02-06 DIAGNOSIS — N186 End stage renal disease: Secondary | ICD-10-CM | POA: Diagnosis not present

## 2022-02-06 DIAGNOSIS — Z8673 Personal history of transient ischemic attack (TIA), and cerebral infarction without residual deficits: Secondary | ICD-10-CM

## 2022-02-06 DIAGNOSIS — Z87442 Personal history of urinary calculi: Secondary | ICD-10-CM

## 2022-02-06 DIAGNOSIS — E039 Hypothyroidism, unspecified: Secondary | ICD-10-CM

## 2022-02-06 DIAGNOSIS — D509 Iron deficiency anemia, unspecified: Secondary | ICD-10-CM | POA: Diagnosis not present

## 2022-02-06 DIAGNOSIS — Z992 Dependence on renal dialysis: Secondary | ICD-10-CM

## 2022-02-06 DIAGNOSIS — Z7982 Long term (current) use of aspirin: Secondary | ICD-10-CM

## 2022-02-06 DIAGNOSIS — Z7985 Long-term (current) use of injectable non-insulin antidiabetic drugs: Secondary | ICD-10-CM

## 2022-02-06 DIAGNOSIS — E1140 Type 2 diabetes mellitus with diabetic neuropathy, unspecified: Secondary | ICD-10-CM

## 2022-02-06 DIAGNOSIS — Z794 Long term (current) use of insulin: Secondary | ICD-10-CM

## 2022-02-06 DIAGNOSIS — E785 Hyperlipidemia, unspecified: Secondary | ICD-10-CM

## 2022-02-06 DIAGNOSIS — I120 Hypertensive chronic kidney disease with stage 5 chronic kidney disease or end stage renal disease: Secondary | ICD-10-CM | POA: Diagnosis not present

## 2022-02-06 DIAGNOSIS — E1122 Type 2 diabetes mellitus with diabetic chronic kidney disease: Secondary | ICD-10-CM | POA: Diagnosis not present

## 2022-02-20 ENCOUNTER — Telehealth: Payer: Self-pay | Admitting: Internal Medicine

## 2022-02-20 DIAGNOSIS — E11319 Type 2 diabetes mellitus with unspecified diabetic retinopathy without macular edema: Secondary | ICD-10-CM

## 2022-02-20 NOTE — Telephone Encounter (Signed)
Pt called in stating the at home physical therapy that the provider recommended want be able to do the services requested so he needs a new referral for outpatient therapy and endocrinologist

## 2022-02-21 ENCOUNTER — Other Ambulatory Visit: Payer: Self-pay

## 2022-02-21 DIAGNOSIS — N186 End stage renal disease: Secondary | ICD-10-CM

## 2022-02-21 DIAGNOSIS — G629 Polyneuropathy, unspecified: Secondary | ICD-10-CM

## 2022-02-21 NOTE — Telephone Encounter (Signed)
Referral placed for new P.T - his home health lost their Physical therapist and is not sure when they will have another one.  Whom do you prefer for Endocrinologist ? Pt has no preference.

## 2022-02-22 NOTE — Telephone Encounter (Signed)
Order placed for referral to endocrinology ().  This is in [place].  There are no endocrinologist in town taking new pts.

## 2022-02-22 NOTE — Addendum Note (Signed)
Addended by: Alisa Graff on: 02/22/2022 04:01 AM   Modules accepted: Orders

## 2022-02-22 NOTE — Telephone Encounter (Signed)
Pt advised orders placed

## 2022-02-24 ENCOUNTER — Other Ambulatory Visit: Payer: Self-pay | Admitting: Internal Medicine

## 2022-03-04 LAB — HM DIABETES EYE EXAM

## 2022-03-07 ENCOUNTER — Ambulatory Visit: Payer: Medicaid Other | Admitting: Podiatry

## 2022-03-13 ENCOUNTER — Ambulatory Visit: Payer: Medicaid Other | Admitting: Podiatry

## 2022-03-13 DIAGNOSIS — M79675 Pain in left toe(s): Secondary | ICD-10-CM

## 2022-03-13 DIAGNOSIS — B351 Tinea unguium: Secondary | ICD-10-CM

## 2022-03-13 DIAGNOSIS — M79674 Pain in right toe(s): Secondary | ICD-10-CM | POA: Diagnosis not present

## 2022-03-18 NOTE — Progress Notes (Signed)
  Subjective:  Patient ID: Gregory Crane, male    DOB: May 15, 1971,  MRN: [id number]  Chief Complaint  Patient presents with   Nail Problem    Thick painful toenails, 3 month follow up     51 y.o. male presents with the above complaint. History confirmed with patient.   Objective:  Physical Exam: warm, good capillary refill, no trophic changes or ulcerative lesions, normal DP and PT pulses, and normal sensory exam. Left Foot: dystrophic yellowed discolored nail plates with subungual debris Right Foot: dystrophic yellowed discolored nail plates with subungual debris  Assessment:   1. Pain due to onychomycosis of toenails of both feet      Plan:  Patient was evaluated and treated and all questions answered.  Discussed the etiology and treatment options for the condition in detail with the patient. Educated patient on the topical and oral treatment options for mycotic nails. Recommended debridement of the nails today. Sharp and mechanical debridement performed of all painful and mycotic nails today. Nails debrided in length and thickness using a nail nipper to level of comfort. Discussed treatment options including appropriate shoe gear. Follow up as needed for painful nails.    Return in about 3 months (around 06/13/2022) for at risk diabetic foot care.

## 2022-03-20 ENCOUNTER — Other Ambulatory Visit: Payer: Self-pay

## 2022-03-20 ENCOUNTER — Encounter: Payer: Self-pay | Admitting: Internal Medicine

## 2022-03-20 DIAGNOSIS — E1122 Type 2 diabetes mellitus with diabetic chronic kidney disease: Secondary | ICD-10-CM

## 2022-03-20 MED ORDER — INSULIN LISPRO 100 UNIT/ML IJ SOLN: INTRAMUSCULAR | 3 refills | Status: DC

## 2022-03-20 MED ORDER — "INSULIN SYRINGE 30G X 5/16"" 0.5 ML MISC": 5 refills | Status: DC

## 2022-03-20 MED ORDER — INSULIN GLARGINE 100 UNIT/ML ~~LOC~~ SOLN: 13.0000 [IU] | Freq: Every day | SUBCUTANEOUS | 2 refills | Status: DC

## 2022-03-22 ENCOUNTER — Encounter: Payer: Self-pay | Admitting: Internal Medicine

## 2022-03-22 ENCOUNTER — Ambulatory Visit (INDEPENDENT_AMBULATORY_CARE_PROVIDER_SITE_OTHER): Payer: Medicaid Other | Admitting: Internal Medicine

## 2022-03-22 DIAGNOSIS — G629 Polyneuropathy, unspecified: Secondary | ICD-10-CM | POA: Diagnosis not present

## 2022-03-22 DIAGNOSIS — E11319 Type 2 diabetes mellitus with unspecified diabetic retinopathy without macular edema: Secondary | ICD-10-CM | POA: Diagnosis not present

## 2022-03-22 DIAGNOSIS — Z86010 Personal history of colonic polyps: Secondary | ICD-10-CM

## 2022-03-22 DIAGNOSIS — R531 Weakness: Secondary | ICD-10-CM

## 2022-03-22 DIAGNOSIS — D649 Anemia, unspecified: Secondary | ICD-10-CM

## 2022-03-22 DIAGNOSIS — E785 Hyperlipidemia, unspecified: Secondary | ICD-10-CM

## 2022-03-22 DIAGNOSIS — Z8673 Personal history of transient ischemic attack (TIA), and cerebral infarction without residual deficits: Secondary | ICD-10-CM

## 2022-03-22 DIAGNOSIS — I10 Essential (primary) hypertension: Secondary | ICD-10-CM

## 2022-03-22 DIAGNOSIS — E039 Hypothyroidism, unspecified: Secondary | ICD-10-CM

## 2022-03-22 DIAGNOSIS — N186 End stage renal disease: Secondary | ICD-10-CM

## 2022-03-22 DIAGNOSIS — Z794 Long term (current) use of insulin: Secondary | ICD-10-CM

## 2022-03-22 MED ORDER — DERMACLOUD EX OINT
TOPICAL_OINTMENT | CUTANEOUS | 0 refills | Status: DC
Start: 2022-03-22 — End: 2022-06-24

## 2022-03-22 MED ORDER — MUPIROCIN 2 % EX OINT: 1.0000 | TOPICAL_OINTMENT | Freq: Two times a day (BID) | CUTANEOUS | 0 refills | Status: DC

## 2022-03-22 NOTE — Progress Notes (Unsigned)
Patient ID: Gregory Crane, male   DOB: [birth year], 51 y.o.   MRN: [id number]   Subjective:    Patient ID: Gregory Crane, male    DOB: 06-24-1971, 51 y.o.   MRN: [id number]  This visit occurred during the SARS-CoV-2 public health emergency.  Safety protocols were in place, including screening questions prior to the visit, additional usage of staff PPE, and extensive cleaning of exam room while observing appropriate contact time as indicated for disinfecting solutions.   Patient here for No chief complaint on file.  Marland Kitchen   HPI    Past Medical History:  Diagnosis Date   Allergy    Anemia    Bell's palsy    Diabetes mellitus without complication ([place])    diet controlled   Hypertension    Hypothyroidism    Kidney stones    Pseudotumor cerebri    Stroke [facility])    Past Surgical History:  Procedure Laterality Date   COLONOSCOPY WITH PROPOFOL N/A 03/30/2020   Procedure: COLONOSCOPY WITH PROPOFOL;  Surgeon: Lesly Rubenstein, MD;  Location: [facility] ENDOSCOPY;  Service: Endoscopy;  Laterality: N/A;   LOOP RECORDER INSERTION N/A 01/27/2018   Procedure: LOOP RECORDER INSERTION;  Surgeon: [name], MD;  Location: [place] [facility];  Service: Cardiovascular;  Laterality: N/A;   LUMBAR PUNCTURE     as child   NO PAST SURGERIES     TEE WITHOUT CARDIOVERSION N/A 01/07/2018   Procedure: TRANSESOPHAGEAL ECHOCARDIOGRAM (TEE);  Surgeon: Minna Merritts, MD;  Location: [facility] ORS;  Service: Cardiovascular;  Laterality: N/A;   Family History  Problem Relation Age of Onset   Breast cancer Mother    Diabetes Father    Diabetes Sister    Arthritis Maternal Grandmother    Diabetes Maternal Grandmother    Social History   Socioeconomic History   Marital status: Widowed    Spouse name: [name]   Number of children: 2   Years of education: Not on file   Highest education level: Not on file  Occupational History   Not on file  Tobacco Use   Smoking status: Never   Smokeless tobacco:  Never  Vaping Use   Vaping Use: Never used  Substance and Sexual Activity   Alcohol use: No    Alcohol/week: 0.0 standard drinks of alcohol    Comment: very rarely (1-2/year)   Drug use: No   Sexual activity: Yes  Other Topics Concern   Not on file  Social History Narrative   Lives at home with wife, independent at baseline.   Social Determinants of Health   Financial Resource Strain: High Risk (06/26/2021)   Overall Financial Resource Strain (CARDIA)    Difficulty of Paying Living Expenses: Hard  Food Insecurity: No Food Insecurity (09/13/2017)   Hunger Vital Sign    Worried About Running Out of Food in the Last Year: Never true    Ran Out of Food in the Last Year: Never true  Transportation Needs: No Transportation Needs (09/13/2017)   PRAPARE - Hydrologist (Medical): No    Lack of Transportation (Non-Medical): No  Physical Activity: Inactive (09/13/2017)   Exercise Vital Sign    Days of Exercise per Week: 0 days    Minutes of Exercise per Session: 0 min  Stress: Stress Concern Present (09/13/2017)   [place]    Feeling of Stress : To some extent  Social Connections: Moderately Integrated (09/13/2017)  Social Licensed conveyancer [NHANES]    Frequency of Communication with Friends and Family: More than three times a week    Frequency of Social Gatherings with Friends and Family: More than three times a week    Attends Religious Services: More than 4 times per year    Active Member of [employer] or Organizations: No    Attends Music therapist: Never    Marital Status: Married     Review of Systems     Objective:     BP 138/66 (BP Location: Left Arm, Patient Position: Sitting, Cuff Size: Large)   Pulse 80   Temp 98.3 F (36.8 C) (Temporal)   Resp 19   Ht '5\' 10"'$  (1.778 m)   Wt 252 lb (114.3 kg)   SpO2 98%   BMI 36.16 kg/m  Wt Readings from Last 3  Encounters:  03/22/22 252 lb (114.3 kg)  01/23/22 264 lb (119.7 kg)  01/23/22 253 lb 3.2 oz (114.9 kg)    Physical Exam   Outpatient Encounter Medications as of 03/22/2022  Medication Sig   Accu-Chek FastClix Lancets MISC USE TO CHECK BLOOD SUGAR THREE TIMES DAILY AS DIRECTED   aspirin (ASPIRIN LOW DOSE) 81 MG EC tablet TAKE 1 TABLET(81 MG) BY MOUTH DAILY   atorvastatin (LIPITOR) 80 MG tablet TAKE 1 TABLET(80 MG) BY MOUTH ONCE DAILY.   cyanocobalamin (,VITAMIN B-12,) 1000 MCG/ML injection INJECT 1 ML INTO THE MUSCLE ONCE A WEEK FOR 3 WEEKS. THEN 1ML EVERY 30 DAYS   glucose blood (FREESTYLE LITE) test strip Check blood sugars twice a day (Dx. 250.02)   hydrALAZINE (APRESOLINE) 50 MG tablet TAKE 1 TABLET(50 MG) BY MOUTH THREE TIMES DAILY   insulin glargine (LANTUS) 100 UNIT/ML injection Inject 0.13 mLs (13 Units total) into the skin once daily.   insulin lispro (HUMALOG) 100 UNIT/ML injection Inject 4-12 units three times daily before meals per sliding scale: If premeal sugar is <130, inject 4 units; if 131-180, inject 6 units; if 181-240, inject 8 units; if 241-300, inject 10 units, if  >300, inject 12 units (max daily dose 36 units)   Insulin Syringe-Needle U-100 (INSULIN SYRINGE .5CC/30GX5/16") 30G X 5/16" 0.5 ML MISC USE AS DIRECTED WITH INSULIN   levothyroxine (SYNTHROID) 175 MCG tablet TAKE 1 TABLET (175 MCG) BY MOUTH ONCE DAILY BEFORE BREAKFAST.   pregabalin (LYRICA) 100 MG capsule TAKE 1 CAPSULE(100 MG) BY MOUTH THREE TIMES DAILY   ROPINIROLE HCL PO Take by mouth.   Semaglutide,0.25 or 0.'5MG'$ /DOS, (OZEMPIC, 0.25 OR 0.5 MG/DOSE,) 2 MG/1.5ML SOPN Inject 0.25 mg weekly for 2 weeks, then increase to 0.5 mg weekly   Syringe/Needle, Disp, (SYRINGE 3CC/25GX1") 25G X 1" 3 ML MISC Use as directed to administer b12 injections.   No facility-administered encounter medications on file as of 03/22/2022.     Lab Results  Component Value Date   WBC 8.1 12/12/2021   HGB 9.8 (L) 12/12/2021   HCT  29.6 (L) 12/12/2021   PLT 272 12/12/2021   GLUCOSE 308 (H) 12/12/2021   CHOL 219 (H) 06/26/2021   TRIG 332.0 (H) 06/26/2021   HDL 31.30 (L) 06/26/2021   LDLDIRECT 107.0 06/26/2021   LDLCALC 66 01/08/2021   ALT 6 06/26/2021   AST 11 06/26/2021   NA 134 (L) 12/12/2021   K 3.4 (L) 12/12/2021   CL 96 (L) 12/12/2021   CREATININE 4.79 (H) 12/12/2021   BUN 73 (H) 12/12/2021   CO2 25 12/12/2021   TSH 13.61 (H) 06/26/2021  INR 0.85 04/09/2018   HGBA1C 8.8 (H) 06/26/2021    CT PELVIS WO CONTRAST  Result Date: 12/12/2021 CLINICAL DATA:  Perianal abscess. EXAM: CT PELVIS WITHOUT CONTRAST TECHNIQUE: Multidetector CT imaging of the pelvis was performed following the standard protocol without intravenous contrast. RADIATION DOSE REDUCTION: This exam was performed according to the departmental dose-optimization program which includes automated exposure control, adjustment of the mA and/or kV according to patient size and/or use of iterative reconstruction technique. COMPARISON:  None. FINDINGS: Urinary Tract:  No abnormality visualized. Bowel:  Unremarkable visualized pelvic bowel loops. Vascular/Lymphatic: Mild aortoiliac atherosclerotic disease. No pelvic adenopathy. Reproductive: The prostate and seminal vesicles are grossly unremarkable. Other: Mild thickening of the scrotal wall. No fluid collection or abscess. Musculoskeletal: Mild degenerative changes. No acute osseous pathology. IMPRESSION: No fluid collection or abscess. Aortic Atherosclerosis (ICD10-I70.0). Electronically Signed   By: Anner Crete M.D.   On: 12/12/2021 22:15       Assessment & Plan:   Problem List Items Addressed This Visit   None    [name], MD

## 2022-03-23 ENCOUNTER — Telehealth: Payer: Self-pay | Admitting: Internal Medicine

## 2022-03-23 ENCOUNTER — Encounter: Payer: Self-pay | Admitting: Internal Medicine

## 2022-03-23 NOTE — Assessment & Plan Note (Signed)
Colonoscopy 03/2020 - multiple polyps.  Recommended f/u in 6 months.  Overdue.  Will need to schedule f/u.  

## 2022-03-23 NOTE — Assessment & Plan Note (Signed)
Continue lyrica.  Follow.  

## 2022-03-23 NOTE — Assessment & Plan Note (Signed)
On lipitor.  Low cholesterol diet and exercise.  Follow lipid panel and liver function tests.   

## 2022-03-23 NOTE — Assessment & Plan Note (Signed)
Sugars as outlined.  Due to have a1c drawn through dialysis soon.  Send results. Due to f/u with endocrinology next month.  Low carb diet.  Follow

## 2022-03-23 NOTE — Assessment & Plan Note (Signed)
On hydralazine.  Blood pressure doing well.  Follow pressures.  Labs drawn through dialysis.

## 2022-03-23 NOTE — Assessment & Plan Note (Signed)
Currently receiving dialysis - Tuesday, Thursday and Saturday.  Labs drawn through dialysis.

## 2022-03-23 NOTE — Assessment & Plan Note (Signed)
Has seen hematology.  Has received iron.  Last hgb 13 - improved.  Follow. Labs followed through dialysis.

## 2022-03-23 NOTE — Assessment & Plan Note (Signed)
Weakness and unsteadiness.  Neuropathy.  Anemic.  ESRD.  Had arranged  home health - nursing, PT.  PT stopped coming as outlined.  Arrange outpt PT.

## 2022-03-23 NOTE — Telephone Encounter (Signed)
Per note, overdue f/u colonoscopy.  Had colonoscopy and recommended 6 month f/u.  If agreeable, needs f/u with GI for f/u colonoscopy.  Also, notify him that when he has labs through dialysis, have them send [place] a copy.

## 2022-03-23 NOTE — Assessment & Plan Note (Signed)
Continue aspirin 

## 2022-03-23 NOTE — Assessment & Plan Note (Signed)
On synthroid now.  Will need f/u tsh.

## 2022-03-26 ENCOUNTER — Telehealth: Payer: Self-pay

## 2022-03-26 NOTE — Telephone Encounter (Signed)
Fyi - pt agreeable to appt w/ Dr Haig Prophet - gastro - has been scheduled for 07/17/2022 at 1030am. Pt has also been added to cancellation list to be moved up if possible  LM to advise pt

## 2022-03-26 NOTE — Telephone Encounter (Signed)
Pt advised. Also advised to please have dialysis center send [place] labs when drawn. Pt stated will tell them when he goes to Dialysis Thursday.

## 2022-03-26 NOTE — Telephone Encounter (Signed)
Lm for pt to cb re : appt w/ Dr Haig Prophet - gastro - has been scheduled for 07/17/2022 at 1030am. Pt has also been added to cancellation list to be moved up if possible

## 2022-04-15 ENCOUNTER — Other Ambulatory Visit: Payer: Self-pay | Admitting: Internal Medicine

## 2022-04-15 NOTE — Telephone Encounter (Signed)
Rx ok'd for lyrica #90 with one refill.

## 2022-04-24 ENCOUNTER — Ambulatory Visit (INDEPENDENT_AMBULATORY_CARE_PROVIDER_SITE_OTHER): Payer: Medicaid Other | Admitting: Internal Medicine

## 2022-04-24 ENCOUNTER — Encounter: Payer: Self-pay | Admitting: Internal Medicine

## 2022-04-24 VITALS — BP 124/60 | HR 75 | Temp 98.0°F | Resp 14 | Ht 70.0 in | Wt 245.8 lb

## 2022-04-24 DIAGNOSIS — Z794 Long term (current) use of insulin: Secondary | ICD-10-CM | POA: Diagnosis not present

## 2022-04-24 DIAGNOSIS — E039 Hypothyroidism, unspecified: Secondary | ICD-10-CM

## 2022-04-24 DIAGNOSIS — N186 End stage renal disease: Secondary | ICD-10-CM

## 2022-04-24 DIAGNOSIS — Z8673 Personal history of transient ischemic attack (TIA), and cerebral infarction without residual deficits: Secondary | ICD-10-CM

## 2022-04-24 DIAGNOSIS — E11319 Type 2 diabetes mellitus with unspecified diabetic retinopathy without macular edema: Secondary | ICD-10-CM

## 2022-04-24 DIAGNOSIS — I10 Essential (primary) hypertension: Secondary | ICD-10-CM

## 2022-04-24 DIAGNOSIS — I779 Disorder of arteries and arterioles, unspecified: Secondary | ICD-10-CM

## 2022-04-24 DIAGNOSIS — E1122 Type 2 diabetes mellitus with diabetic chronic kidney disease: Secondary | ICD-10-CM

## 2022-04-24 DIAGNOSIS — Z01818 Encounter for other preprocedural examination: Secondary | ICD-10-CM | POA: Insufficient documentation

## 2022-04-24 DIAGNOSIS — N183 Chronic kidney disease, stage 3 unspecified: Secondary | ICD-10-CM

## 2022-04-24 DIAGNOSIS — D649 Anemia, unspecified: Secondary | ICD-10-CM

## 2022-04-24 DIAGNOSIS — G629 Polyneuropathy, unspecified: Secondary | ICD-10-CM

## 2022-04-24 DIAGNOSIS — E785 Hyperlipidemia, unspecified: Secondary | ICD-10-CM

## 2022-04-24 LAB — POCT GLYCOSYLATED HEMOGLOBIN (HGB A1C): Hemoglobin A1C: 6.9 % — AB (ref 4.0–5.6)

## 2022-04-24 LAB — POCT CBG (FASTING - GLUCOSE)-MANUAL ENTRY: Glucose Fasting, POC: 128 mg/dL — AB (ref 70–99)

## 2022-04-24 NOTE — Progress Notes (Signed)
Patient ID: Gregory Crane, male   DOB: 08-15-1971, 51 y.o.   MRN: [id number]   Subjective:    Patient ID: Gregory Crane, male    DOB: 1971-06-07, 51 y.o.   MRN: [id number]   Patient here for work in appt.   Chief Complaint  Patient presents with   Medical Clearance    Per Dr.Jlon Betker   .   HPI Here as a work in for pre op clearance.  Scheduled to have cataract extraction 04/26/22.  Reports that he has noticed increased fatigue with walking up stairs.  No chest pain.  No increased cough or congestion.  No acid reflux.  No abdominal pain.  Bowels moving.  Sugars overall improved.  No significant low sugars.  Blood pressure has been doing better.  No increased swelling.    Past Medical History:  Diagnosis Date   Allergy    Anemia    Bell's palsy    Diabetes mellitus without complication ([name])    diet controlled   Hypertension    Hypothyroidism    Kidney stones    Pseudotumor cerebri    Stroke [facility])    Past Surgical History:  Procedure Laterality Date   COLONOSCOPY WITH PROPOFOL N/A 03/30/2020   Procedure: COLONOSCOPY WITH PROPOFOL;  Surgeon: Lesly Rubenstein, MD;  Location: [facility] ENDOSCOPY;  Service: Endoscopy;  Laterality: N/A;   LOOP RECORDER INSERTION N/A 01/27/2018   Procedure: LOOP RECORDER INSERTION;  Surgeon: [name], MD;  Location: [place] CV LAB;  Service: Cardiovascular;  Laterality: N/A;   LUMBAR PUNCTURE     as child   NO PAST SURGERIES     TEE WITHOUT CARDIOVERSION N/A 01/07/2018   Procedure: TRANSESOPHAGEAL ECHOCARDIOGRAM (TEE);  Surgeon: Minna Merritts, MD;  Location: [facility] ORS;  Service: Cardiovascular;  Laterality: N/A;   Family History  Problem Relation Age of Onset   Breast cancer Mother    Diabetes Father    Diabetes Sister    Arthritis Maternal Grandmother    Diabetes Maternal Grandmother    Social History   Socioeconomic History   Marital status: Widowed    Spouse name: [name]   Number of children: 2   Years of education: Not on  file   Highest education level: Not on file  Occupational History   Not on file  Tobacco Use   Smoking status: Never   Smokeless tobacco: Never  Vaping Use   Vaping Use: Never used  Substance and Sexual Activity   Alcohol use: No    Alcohol/week: 0.0 standard drinks of alcohol    Comment: very rarely (1-2/year)   Drug use: No   Sexual activity: Yes  Other Topics Concern   Not on file  Social History Narrative   Lives at home with wife, independent at baseline.   Social Determinants of Health   Financial Resource Strain: High Risk (06/26/2021)   Overall Financial Resource Strain (CARDIA)    Difficulty of Paying Living Expenses: Hard  Food Insecurity: No Food Insecurity (05/05/2022)   Hunger Vital Sign    Worried About Running Out of Food in the Last Year: Never true    Ran Out of Food in the Last Year: Never true  Transportation Needs: No Transportation Needs (05/05/2022)   PRAPARE - Hydrologist (Medical): No    Lack of Transportation (Non-Medical): No  Physical Activity: Inactive (09/13/2017)   Exercise Vital Sign    Days of Exercise per Week: 0 days  Minutes of Exercise per Session: 0 min  Stress: Stress Concern Present (09/13/2017)   [place]    Feeling of Stress : To some extent  Social Connections: Moderately Integrated (09/13/2017)   Social Connection and Isolation Panel [NHANES]    Frequency of Communication with Friends and Family: More than three times a week    Frequency of Social Gatherings with Friends and Family: More than three times a week    Attends Religious Services: More than 4 times per year    Active Member of [employer] or Organizations: No    Attends Archivist Meetings: Never    Marital Status: Married     Review of Systems  Constitutional:  Negative for appetite change and unexpected weight change.  HENT:  Negative for congestion and sinus  pressure.   Respiratory:  Negative for cough and chest tightness.        Reports breathing overall stable.   Cardiovascular:  Negative for chest pain and palpitations.       No increased swelling.   Gastrointestinal:  Negative for abdominal pain, diarrhea, nausea and vomiting.  Genitourinary:  Negative for difficulty urinating and dysuria.  Musculoskeletal:  Negative for joint swelling and myalgias.  Skin:  Negative for color change and rash.  Neurological:  Negative for dizziness, light-headedness and headaches.  Psychiatric/Behavioral:  Negative for agitation and dysphoric mood.        Objective:     BP 124/60 (BP Location: Left Arm, Patient Position: Sitting, Cuff Size: Normal)   Pulse 75   Temp 98 F (36.7 C) (Oral)   Resp 14   Ht _0  (1.778 m)   Wt 245 lb 12.8 oz (111.5 kg)   SpO2 97%   BMI 35.27 kg/m  Wt Readings from Last 3 Encounters:  05/04/22 246 lb 14.6 oz (112 kg)  05/01/22 247 lb (112 kg)  04/24/22 245 lb 12.8 oz (111.5 kg)    Physical Exam Vitals reviewed.  Constitutional:      General: He is not in acute distress.    Appearance: Normal appearance. He is well-developed.  HENT:     Head: Normocephalic and atraumatic.     Right Ear: External ear normal.     Left Ear: External ear normal.  Eyes:     General: No scleral icterus.       Right eye: No discharge.        Left eye: No discharge.     Conjunctiva/sclera: Conjunctivae normal.  Cardiovascular:     Rate and Rhythm: Normal rate and regular rhythm.  Pulmonary:     Effort: Pulmonary effort is normal. No respiratory distress.     Breath sounds: Normal breath sounds.  Abdominal:     General: Bowel sounds are normal.     Palpations: Abdomen is soft.     Tenderness: There is no abdominal tenderness.  Musculoskeletal:        General: No tenderness.     Cervical back: Neck supple. No tenderness.     Comments: No increased swelling.   Lymphadenopathy:     Cervical: No cervical adenopathy.   Skin:    Findings: No erythema or rash.  Neurological:     Mental Status: He is alert.  Psychiatric:        Mood and Affect: Mood normal.        Behavior: Behavior normal.      No facility-administered encounter medications on file as of 04/24/2022.  Outpatient Encounter Medications as of 04/24/2022  Medication Sig   Accu-Chek FastClix Lancets MISC USE TO CHECK BLOOD SUGAR THREE TIMES DAILY AS DIRECTED   aspirin (ASPIRIN LOW DOSE) 81 MG EC tablet TAKE 1 TABLET(81 MG) BY MOUTH DAILY   glucose blood (FREESTYLE LITE) test strip Check blood sugars twice a day (Dx. 250.02)   hydrALAZINE (APRESOLINE) 50 MG tablet TAKE 1 TABLET(50 MG) BY MOUTH THREE TIMES DAILY (Patient taking differently: in the morning and at bedtime.)   Infant Care Products (DERMACLOUD) OINT Apply to affected area bid prn   insulin glargine (LANTUS) 100 UNIT/ML injection Inject 0.13 mLs (13 Units total) into the skin once daily. (Patient taking differently: Inject 10-15 Units into the skin daily.)   insulin lispro (HUMALOG) 100 UNIT/ML injection Inject 4-12 units three times daily before meals per sliding scale: If premeal sugar is <130, inject 4 units; if 131-180, inject 6 units; if 181-240, inject 8 units; if 241-300, inject 10 units, if  >300, inject 12 units (max daily dose 36 units)   levothyroxine (SYNTHROID) 175 MCG tablet TAKE 1 TABLET (175 MCG) BY MOUTH ONCE DAILY BEFORE BREAKFAST.   mupirocin ointment (BACTROBAN) 2 % Apply 1 Application topically 2 (two) times daily.   pregabalin (LYRICA) 100 MG capsule TAKE 1 CAPSULE(100 MG) BY MOUTH THREE TIMES DAILY (Patient taking differently: daily.)   Syringe/Needle, Disp, (SYRINGE 3CC/25GX1") 25G X 1" 3 ML MISC Use as directed to administer b12 injections.   [DISCONTINUED] Insulin Syringe-Needle U-100 (INSULIN SYRINGE .5CC/30GX5/16") 30G X 5/16" 0.5 ML MISC USE AS DIRECTED WITH INSULIN   [DISCONTINUED] Semaglutide,0.25 or 0.5MG/DOS, (OZEMPIC, 0.25 OR 0.5 MG/DOSE,) 2 MG/1.5ML  SOPN Inject 0.25 mg weekly for 2 weeks, then increase to 0.5 mg weekly (Patient not taking: Reported on 05/01/2022)   atorvastatin (LIPITOR) 80 MG tablet TAKE 1 TABLET(80 MG) BY MOUTH ONCE DAILY. (Patient not taking: Reported on 05/05/2022)   cyanocobalamin (,VITAMIN B-12,) 1000 MCG/ML injection INJECT 1 ML INTO THE MUSCLE ONCE A WEEK FOR 3 WEEKS. THEN 1ML EVERY 30 DAYS (Patient not taking: Reported on 05/01/2022)   [DISCONTINUED] ROPINIROLE HCL PO Take by mouth. (Patient not taking: Reported on 04/24/2022)     Lab Results  Component Value Date   WBC 10.4 05/05/2022   HGB 12.0 (L) 05/05/2022   HCT 36.2 (L) 05/05/2022   PLT 213 05/05/2022   GLUCOSE 193 (H) 05/05/2022   CHOL 219 (H) 06/26/2021   TRIG 332.0 (H) 06/26/2021   HDL 31.30 (L) 06/26/2021   LDLDIRECT 107.0 06/26/2021   LDLCALC 66 01/08/2021   ALT 12 05/04/2022   AST 18 05/04/2022   NA 138 05/05/2022   K 3.3 (L) 05/05/2022   CL 103 05/05/2022   CREATININE 3.70 (H) 05/05/2022   BUN 31 (H) 05/05/2022   CO2 28 05/05/2022   TSH 13.61 (H) 06/26/2021   INR 1.0 05/04/2022   HGBA1C 6.9 (A) 04/24/2022    CT PELVIS WO CONTRAST  Result Date: 12/12/2021 CLINICAL DATA:  Perianal abscess. EXAM: CT PELVIS WITHOUT CONTRAST TECHNIQUE: Multidetector CT imaging of the pelvis was performed following the standard protocol without intravenous contrast. RADIATION DOSE REDUCTION: This exam was performed according to the departmental dose-optimization program which includes automated exposure control, adjustment of the mA and/or kV according to patient size and/or use of iterative reconstruction technique. COMPARISON:  None. FINDINGS: Urinary Tract:  No abnormality visualized. Bowel:  Unremarkable visualized pelvic bowel loops. Vascular/Lymphatic: Mild aortoiliac atherosclerotic disease. No pelvic adenopathy. Reproductive: The prostate and seminal vesicles are  grossly unremarkable. Other: Mild thickening of the scrotal wall. No fluid collection or abscess.  Musculoskeletal: Mild degenerative changes. No acute osseous pathology. IMPRESSION: No fluid collection or abscess. Aortic Atherosclerosis (ICD10-I70.0). Electronically Signed   By: Anner Crete M.D.   On: 12/12/2021 22:15       Assessment & Plan:   Problem List Items Addressed This Visit     Anemia    Check cbc prior to surgery.       Carotid artery disease (HCC)    Last carotid ultrasound - <50% bilaterally.  Have discussed need to take statin and keep blood pressure under control.       Diabetes ([place])   Relevant Orders   POCT CBG (Fasting - Glucose) (Completed)   Ambulatory referral to Cardiology   POCT HgB A1C (Completed)   ESRD (end stage renal disease) ([place])    Sees nephrology.  On dialysis.  Tolerating.       Essential hypertension    Blood pressure has been doing better.  Continue hydralazine and amlodipine.  Follow pressures.  Follow metabolic panel.  Will need close intra op and post op monitoring of heart rate and blood pressure to avoid extremes.       Relevant Orders   Ambulatory referral to Cardiology   History of CVA (cerebrovascular accident)    Continue aspirin and lipitor.  Continue blood pressure control.  Per note, does not need to stop aspirin prior to procedure.       Hyperlipidemia    Low cholesterol diet and exercise.  Follow lipid panel and liver function tests.       Hypothyroidism    On thyroid replacement.  Follow tsh       Neuropathy    Continue lyrica.  Follow.       Pre-op evaluation - Primary    Planning for eye surgery.  Discussed procedure.  Discussed his current functional status.  Increased fatigue with walking up a flight of stairs.  EKG - SR, appears to have q waves in III and avf.  Discussed my recommendation for cardiology evaluation prior to surgery - for cardiac clearance.       Relevant Orders   EKG 12-Lead (Completed)   Ambulatory referral to Cardiology   Type 2 diabetes mellitus, with long-term current use of  insulin (HCC)    Low carb diet and exercise.  No change in medication.  Follow met b and a1c.       Relevant Orders   POCT CBG (Fasting - Glucose) (Completed)   Ambulatory referral to Cardiology   POCT HgB A1C (Completed)     [name], MD

## 2022-04-26 ENCOUNTER — Encounter: Payer: Self-pay | Admitting: Internal Medicine

## 2022-04-26 ENCOUNTER — Other Ambulatory Visit: Payer: Self-pay

## 2022-04-26 MED ORDER — "INSULIN SYRINGE 30G X 5/16"" 0.5 ML MISC": 5 refills | Status: DC

## 2022-04-30 ENCOUNTER — Encounter: Payer: Self-pay | Admitting: Internal Medicine

## 2022-05-01 ENCOUNTER — Ambulatory Visit: Payer: Medicaid Other | Attending: Cardiology | Admitting: Cardiology

## 2022-05-01 ENCOUNTER — Encounter: Payer: Self-pay | Admitting: Cardiology

## 2022-05-01 VITALS — BP 124/66 | HR 94 | Ht 72.0 in | Wt 247.0 lb

## 2022-05-01 DIAGNOSIS — R9431 Abnormal electrocardiogram [ECG] [EKG]: Secondary | ICD-10-CM | POA: Insufficient documentation

## 2022-05-01 DIAGNOSIS — E785 Hyperlipidemia, unspecified: Secondary | ICD-10-CM | POA: Diagnosis not present

## 2022-05-01 DIAGNOSIS — I10 Essential (primary) hypertension: Secondary | ICD-10-CM | POA: Diagnosis not present

## 2022-05-01 DIAGNOSIS — I639 Cerebral infarction, unspecified: Secondary | ICD-10-CM | POA: Diagnosis present

## 2022-05-01 DIAGNOSIS — Z01818 Encounter for other preprocedural examination: Secondary | ICD-10-CM | POA: Diagnosis not present

## 2022-05-01 NOTE — Progress Notes (Signed)
Cardiology Office Note:    Date:  05/01/2022   ID:  Gregory Crane, DOB 1971-02-27, MRN [id number]  PCP:  [name], MD  Cardiologist:  None  Electrophysiologist:  None   Referring MD: [name], MD   Chief Complaint  Patient presents with   Pre-op Exam    History of Present Illness:    Gregory Crane is a 51 y.o. male with a hx of T2DM, ESRD, HTN, CVA, hypothyroidism who is referred by Dr Nicki Reaper for preop evaluation prior to cataract surgery.  Denies any chest pain, dyspnea, lightheadedness, syncope, lower extremity edema, or palpitations he reports his mobility is limited by balance issues from neuropathy but can walk up flight of stairs without any exertional symptoms.  No smoking history.  No history of heart disease in his immediate family.  Had CVA in 2019 and underwent loop recorder placement.  Loop recorder showed no Afib and loop was removed 12/2021.  TTE 09/13/17 EF 60-65%, G1DD, normal RV function, no significant valvular disease.  TEE 12/2017 shows no intracardiac source of embolism, normal bubble study.   Past Medical History:  Diagnosis Date   Allergy    Anemia    Bell's palsy    Diabetes mellitus without complication ([place])    diet controlled   Hypertension    Hypothyroidism    Kidney stones    Pseudotumor cerebri    Stroke [facility])     Past Surgical History:  Procedure Laterality Date   COLONOSCOPY WITH PROPOFOL N/A 03/30/2020   Procedure: COLONOSCOPY WITH PROPOFOL;  Surgeon: Lesly Rubenstein, MD;  Location: [facility] END[facility];  Service: Endoscopy;  Laterality: N/A;   LOOP RECORDER INSERTION N/A 01/27/2018   Procedure: LOOP RECORDER INSERTION;  Surgeon: [name], MD;  Location: [facility];  Service: Cardiovascular;  Laterality: N/A;   LUMBAR PUNCTURE     as child   NO PAST SURGERIES     TEE WITHOUT CARDIOVERSION N/A 01/07/2018   Procedure: TRANSESOPHAGEAL ECHOCARDIOGRAM (TEE);  Surgeon: Minna Merritts, MD;  Location: [facility] ORS;  Service:  Cardiovascular;  Laterality: N/A;    Current Medications: Current Meds  Medication Sig   Accu-Chek FastClix Lancets MISC USE TO CHECK BLOOD SUGAR THREE TIMES DAILY AS DIRECTED   aspirin (ASPIRIN LOW DOSE) 81 MG EC tablet TAKE 1 TABLET(81 MG) BY MOUTH DAILY   atorvastatin (LIPITOR) 80 MG tablet TAKE 1 TABLET(80 MG) BY MOUTH ONCE DAILY.   glucose blood (FREESTYLE LITE) test strip Check blood sugars twice a day (Dx. 250.02)   hydrALAZINE (APRESOLINE) 50 MG tablet TAKE 1 TABLET(50 MG) BY MOUTH THREE TIMES DAILY (Patient taking differently: in the morning and at bedtime.)   [place] ([place]) OINT Apply to affected area bid prn   insulin glargine (LANTUS) 100 UNIT/ML injection Inject 0.13 mLs (13 Units total) into the skin once daily.   insulin lispro (HUMALOG) 100 UNIT/ML injection Inject 4-12 units three times daily before meals per sliding scale: If premeal sugar is <130, inject 4 units; if 131-180, inject 6 units; if 181-240, inject 8 units; if 241-300, inject 10 units, if  >300, inject 12 units (max daily dose 36 units)   Insulin Syringe-Needle U-100 (INSULIN SYRINGE .5CC/30GX5/16") 30G X 5/16" 0.5 ML MISC USE AS DIRECTED WITH INSULIN   levothyroxine (SYNTHROID) 175 MCG tablet TAKE 1 TABLET (175 MCG) BY MOUTH ONCE DAILY BEFORE BREAKFAST.   mupirocin ointment (BACTROBAN) 2 % Apply 1 Application topically 2 (two) times daily.   pregabalin (  LYRICA) 100 MG capsule TAKE 1 CAPSULE(100 MG) BY MOUTH THREE TIMES DAILY (Patient taking differently: daily.)   Syringe/Needle, Disp, (SYRINGE 3CC/25GX1") 25G X 1" 3 ML MISC Use as directed to administer b12 injections.     Allergies:   Patient has no known allergies.   Social History   Socioeconomic History   Marital status: Widowed    Spouse name: [name]   Number of children: 2   Years of education: Not on file   Highest education level: Not on file  Occupational History   Not on file  Tobacco Use   Smoking status: Never    Smokeless tobacco: Never  Vaping Use   Vaping Use: Never used  Substance and Sexual Activity   Alcohol use: No    Alcohol/week: 0.0 standard drinks of alcohol    Comment: very rarely (1-2/year)   Drug use: No   Sexual activity: Yes  Other Topics Concern   Not on file  Social History Narrative   Lives at home with wife, independent at baseline.   Social Determinants of Health   Financial Resource Strain: High Risk (06/26/2021)   Overall Financial Resource Strain (CARDIA)    Difficulty of Paying Living Expenses: Hard  Food Insecurity: No Food Insecurity (09/13/2017)   Hunger Vital Sign    Worried About Running Out of Food in the Last Year: Never true    Ran Out of Food in the Last Year: Never true  Transportation Needs: No Transportation Needs (09/13/2017)   PRAPARE - Hydrologist (Medical): No    Lack of Transportation (Non-Medical): No  Physical Activity: Inactive (09/13/2017)   Exercise Vital Sign    Days of Exercise per Week: 0 days    Minutes of Exercise per Session: 0 min  Stress: Stress Concern Present (09/13/2017)   [place]    Feeling of Stress : To some extent  Social Connections: Moderately Integrated (09/13/2017)   Social Connection and Isolation Panel [NHANES]    Frequency of Communication with Friends and Family: More than three times a week    Frequency of Social Gatherings with Friends and Family: More than three times a week    Attends Religious Services: More than 4 times per year    Active Member of [employer] or Organizations: No    Attends Music therapist: Never    Marital Status: Married     Family History: The patient's family history includes Arthritis in his maternal grandmother; Breast cancer in his mother; Diabetes in his father, maternal grandmother, and sister.  ROS:   Please see the history of present illness.     All other systems reviewed  and are negative.  EKGs/Labs/Other Studies Reviewed:    The following studies were reviewed today:   EKG:   04/24/2022: Normal sinus rhythm, rate 73, Q waves in leads III, aVF, nonspecific T wave flattening  Recent Labs: 06/26/2021: ALT 6; TSH 13.61 12/12/2021: BUN 73; Creatinine, Ser 4.79; Hemoglobin 9.8; Platelets 272; Potassium 3.4; Sodium 134  Recent Lipid Panel    Component Value Date/Time   CHOL 219 (H) 06/26/2021 1331   CHOL 109 03/28/2020 1202   TRIG 332.0 (H) 06/26/2021 1331   HDL 31.30 (L) 06/26/2021 1331   HDL 31 (L) 03/28/2020 1202   CHOLHDL 7 06/26/2021 1331   VLDL 66.4 (H) 06/26/2021 1331   LDLCALC 66 01/08/2021 1158   LDLCALC 60 03/28/2020 1202   LDLDIRECT 107.0 06/26/2021  1331    Physical Exam:    VS:  BP 124/66   Pulse 94   Ht 6' (1.829 m)   Wt 247 lb (112 kg)   SpO2 97%   BMI 33.50 kg/m     Wt Readings from Last 3 Encounters:  05/01/22 247 lb (112 kg)  04/24/22 245 lb 12.8 oz (111.5 kg)  03/22/22 252 lb (114.3 kg)     GEN:  Well nourished, well developed in no acute distress HEENT: Normal NECK: No JVD; No carotid bruits LYMPHATICS: No lymphadenopathy CARDIAC: RRR, no murmurs, rubs, gallops RESPIRATORY:  Clear to auscultation without rales, wheezing or rhonchi  ABDOMEN: Soft, non-tender, non-distended MUSCULOSKELETAL:  No edema; No deformity  SKIN: Warm and dry NEUROLOGIC:  Alert and oriented x 3 PSYCHIATRIC:  Normal affect   ASSESSMENT:    1. Pre-op evaluation   2. Abnormal EKG   3. Primary hypertension   4. Hyperlipidemia, unspecified hyperlipidemia type   5. Cerebrovascular accident (CVA), unspecified mechanism ([place])    PLAN:    Preop evaluation: Prior to cataract surgery.  Given low risk surgery, no further cardiac work-up recommended prior to procedure.  Functional capacity limited by his neuropathy but reports is able to do greater than 4 METS.  Abnormal EKG: inferior Q waves, will check echo  HTN: on amlodipine 10 mg daily and  hydralazine 50 mg TID.  Appears controlled.  HLD: on atorvastatin 80 mg daily.  LDL 66 on 01/08/2021  T2DM: on insulin.  A1c 6.9% on 04/24/2022  ESRD: on HD  CVA: on ASA, statin.  Had CVA in 2019 and underwent loop recorder placement.  Loop recorder showed no Afib and was removed 12/2021.  TTE 09/13/17 EF 60-65%, G1DD, normal RV function, no significant valvular disease.  TEE 12/2017 shows no intracardiac source of embolism, normal bubble study.    RTC in 6 months   Medication Adjustments/Labs and Tests Ordered: Current medicines are reviewed at length with the patient today.  Concerns regarding medicines are outlined above.  Orders Placed This Encounter  Procedures   ECHOCARDIOGRAM COMPLETE   No orders of the defined types were placed in this encounter.   Patient Instructions  Medication Instructions:  Your physician recommends that you continue on your current medications as directed. Please refer to the Current Medication list given to you today.  *If you need a refill on your cardiac medications before your next appointment, please call your pharmacy*  Testing/Procedures: Your physician has requested that you have an echocardiogram. Echocardiography is a painless test that uses sound waves to create images of your heart. It provides your doctor with information about the size and shape of your heart and how well your heart's chambers and valves are working. This procedure takes approximately one hour. There are no restrictions for this procedure.  Follow-Up: At [facility], you and your health needs are our priority.  As part of our continuing mission to provide you with exceptional heart care, we have created designated Provider Care Teams.  These Care Teams include your primary Cardiologist (physician) and Advanced Practice Providers (APPs -  Physician Assistants and Nurse Practitioners) who all work together to provide you with the care you need, when you need it.  We  recommend signing up for the patient portal called "MyChart".  Sign up information is provided on this After Visit Summary.  MyChart is used to connect with patients for Virtual Visits (Telemedicine).  Patients are able to view lab/test results, encounter notes, upcoming  appointments, etc.  Non-urgent messages can be sent to your provider as well.   To learn more about what you can do with MyChart, go to [email or web address].    Your next appointment:   6 month(s)  The format for your next appointment:   In Person  Provider:   Dr. Gardiner Rhyme  Important Information About Sugar         Signed, Donato Heinz, MD  05/01/2022 6:21 PM    [place]

## 2022-05-01 NOTE — Patient Instructions (Signed)
Medication Instructions:  Your physician recommends that you continue on your current medications as directed. Please refer to the Current Medication list given to you today.  *If you need a refill on your cardiac medications before your next appointment, please call your pharmacy*  Testing/Procedures: Your physician has requested that you have an echocardiogram. Echocardiography is a painless test that uses sound waves to create images of your heart. It provides your doctor with information about the size and shape of your heart and how well your heart's chambers and valves are working. This procedure takes approximately one hour. There are no restrictions for this procedure.  Follow-Up: At [facility], you and your health needs are our priority.  As part of our continuing mission to provide you with exceptional heart care, we have created designated Provider Care Teams.  These Care Teams include your primary Cardiologist (physician) and Advanced Practice Providers (APPs -  Physician Assistants and Nurse Practitioners) who all work together to provide you with the care you need, when you need it.  We recommend signing up for the patient portal called "MyChart".  Sign up information is provided on this After Visit Summary.  MyChart is used to connect with patients for Virtual Visits (Telemedicine).  Patients are able to view lab/test results, encounter notes, upcoming appointments, etc.  Non-urgent messages can be sent to your provider as well.   To learn more about what you can do with MyChart, go to [email or web address].    Your next appointment:   6 month(s)  The format for your next appointment:   In Person  Provider:   Dr. Gardiner Rhyme  Important Information About Sugar

## 2022-05-03 ENCOUNTER — Ambulatory Visit: Payer: Medicaid Other | Admitting: Nurse Practitioner

## 2022-05-03 ENCOUNTER — Telehealth: Payer: Self-pay | Admitting: *Deleted

## 2022-05-03 NOTE — Telephone Encounter (Signed)
   Pre-operative Risk Assessment    Patient Name: Gregory Crane  DOB: 1971/03/05 MRN: [id number]    PER CLEARANCE FORM: IN ORDER TO PROCEED, WE ARE REQUESTING MEDICAL CLEARANCE BASED ON: PCP hx SIGNIFICANT FOR HOSPITALIZATION IN MAY 2023 FOR "FLUID OVERLOAD" AND POSSIBLE CHF EXACERBATION. PCP REQUESTED THAT WE REQUEST CARDIAC CLEARANCE DUE TO SOB AND EKG CHANGES. PATIENT REPORTS RECENT VISIT AT YOUR OFFICE DURING WHICH HE WAS CLEARED FOR CATARACT SURGERY.  Request for Surgical Clearance    Procedure:   CATARACT EXTRACTION BY PE, IOL-LEFT EYE  Date of Surgery:  Clearance TBD                                 Surgeon:  DR. Tama Crane Surgeon's Group or Practice Name:  [facility]  Phone number:  [phone] EXT [phone] ATTN: Gregory Cloud, NP Fax number:  [phone]   Type of Clearance Requested:   - Medical ; NO MEDICATIONS LISTED AS NEEDING TO BE HELD   Type of Anesthesia:   IV SEDATION   Additional requests/questions:    Gregory Crane   05/03/2022, 5:21 PM

## 2022-05-04 ENCOUNTER — Observation Stay
Admission: EM | Admit: 2022-05-04 | Discharge: 2022-05-05 | Disposition: A | Discharge status: Home / Self Care | Payer: Medicaid Other | Attending: Emergency Medicine | Admitting: Emergency Medicine

## 2022-05-04 ENCOUNTER — Emergency Department: Payer: Medicaid Other

## 2022-05-04 ENCOUNTER — Other Ambulatory Visit: Payer: Self-pay

## 2022-05-04 DIAGNOSIS — Z992 Dependence on renal dialysis: Secondary | ICD-10-CM | POA: Diagnosis not present

## 2022-05-04 DIAGNOSIS — E039 Hypothyroidism, unspecified: Secondary | ICD-10-CM | POA: Diagnosis present

## 2022-05-04 DIAGNOSIS — R339 Retention of urine, unspecified: Secondary | ICD-10-CM

## 2022-05-04 DIAGNOSIS — Z950 Presence of cardiac pacemaker: Secondary | ICD-10-CM | POA: Insufficient documentation

## 2022-05-04 DIAGNOSIS — Z794 Long term (current) use of insulin: Secondary | ICD-10-CM | POA: Diagnosis not present

## 2022-05-04 DIAGNOSIS — Z8673 Personal history of transient ischemic attack (TIA), and cerebral infarction without residual deficits: Secondary | ICD-10-CM

## 2022-05-04 DIAGNOSIS — N186 End stage renal disease: Secondary | ICD-10-CM | POA: Diagnosis not present

## 2022-05-04 DIAGNOSIS — D72829 Elevated white blood cell count, unspecified: Secondary | ICD-10-CM | POA: Diagnosis present

## 2022-05-04 DIAGNOSIS — Z79899 Other long term (current) drug therapy: Secondary | ICD-10-CM | POA: Diagnosis not present

## 2022-05-04 DIAGNOSIS — E1129 Type 2 diabetes mellitus with other diabetic kidney complication: Secondary | ICD-10-CM | POA: Diagnosis present

## 2022-05-04 DIAGNOSIS — Z7982 Long term (current) use of aspirin: Secondary | ICD-10-CM | POA: Diagnosis not present

## 2022-05-04 DIAGNOSIS — I120 Hypertensive chronic kidney disease with stage 5 chronic kidney disease or end stage renal disease: Secondary | ICD-10-CM | POA: Diagnosis not present

## 2022-05-04 DIAGNOSIS — N1330 Unspecified hydronephrosis: Secondary | ICD-10-CM | POA: Diagnosis not present

## 2022-05-04 DIAGNOSIS — I10 Essential (primary) hypertension: Secondary | ICD-10-CM | POA: Diagnosis present

## 2022-05-04 DIAGNOSIS — R109 Unspecified abdominal pain: Secondary | ICD-10-CM

## 2022-05-04 DIAGNOSIS — E1122 Type 2 diabetes mellitus with diabetic chronic kidney disease: Secondary | ICD-10-CM | POA: Diagnosis not present

## 2022-05-04 DIAGNOSIS — E785 Hyperlipidemia, unspecified: Secondary | ICD-10-CM | POA: Diagnosis present

## 2022-05-04 LAB — URINALYSIS, ROUTINE W REFLEX MICROSCOPIC
Bacteria, UA: NONE SEEN
Bilirubin Urine: NEGATIVE
Glucose, UA: >=500 mg/dL — AB
Ketones, ur: NEGATIVE mg/dL
Leukocytes,Ua: NEGATIVE
Nitrite: NEGATIVE
Protein, ur: 100 mg/dL — AB
Specific Gravity, Urine: 1.012 (ref 1.005–1.030)
pH: 6 (ref 5.0–8.0)

## 2022-05-04 LAB — COMPREHENSIVE METABOLIC PANEL
ALT: 12 U/L (ref 0–44)
AST: 18 U/L (ref 15–41)
Albumin: 4.2 g/dL (ref 3.5–5.0)
Alkaline Phosphatase: 77 U/L (ref 38–126)
Anion gap: 14 (ref 5–15)
BUN: 42 mg/dL — ABNORMAL HIGH (ref 6–20)
CO2: 21 mmol/L — ABNORMAL LOW (ref 22–32)
Calcium: 9.2 mg/dL (ref 8.9–10.3)
Chloride: 103 mmol/L (ref 98–111)
Creatinine, Ser: 5.05 mg/dL — ABNORMAL HIGH (ref 0.61–1.24)
GFR, Estimated: 13 mL/min — ABNORMAL LOW (ref >60)
Glucose, Bld: 257 mg/dL — ABNORMAL HIGH (ref 70–99)
Potassium: 4.1 mmol/L (ref 3.5–5.1)
Sodium: 138 mmol/L (ref 135–145)
Total Bilirubin: 0.4 mg/dL (ref 0.3–1.2)
Total Protein: 8.3 g/dL — ABNORMAL HIGH (ref 6.5–8.1)

## 2022-05-04 LAB — CBC
HCT: 38.2 % — ABNORMAL LOW (ref 39.0–52.0)
HCT: 43.5 % (ref 39.0–52.0)
Hemoglobin: 12.6 g/dL — ABNORMAL LOW (ref 13.0–17.0)
Hemoglobin: 14.5 g/dL (ref 13.0–17.0)
MCH: 29.4 pg (ref 26.0–34.0)
MCH: 29.2 pg (ref 26.0–34.0)
MCHC: 33 g/dL (ref 30.0–36.0)
MCHC: 33.3 g/dL (ref 30.0–36.0)
MCV: 89 fL (ref 80.0–100.0)
MCV: 87.5 fL (ref 80.0–100.0)
Platelets: 195 10*3/uL (ref 150–400)
Platelets: 176 10*3/uL (ref 150–400)
RBC: 4.29 MIL/uL (ref 4.22–5.81)
RBC: 4.97 MIL/uL (ref 4.22–5.81)
RDW: 13.4 % (ref 11.5–15.5)
RDW: 13.4 % (ref 11.5–15.5)
WBC: 11.8 10*3/uL — ABNORMAL HIGH (ref 4.0–10.5)
WBC: 9.8 10*3/uL (ref 4.0–10.5)
nRBC: 0 % (ref 0.0–0.2)
nRBC: 0 % (ref 0.0–0.2)

## 2022-05-04 LAB — RENAL FUNCTION PANEL
Albumin: 4.2 g/dL (ref 3.5–5.0)
Anion gap: 14 (ref 5–15)
BUN: 43 mg/dL — ABNORMAL HIGH (ref 6–20)
CO2: 22 mmol/L (ref 22–32)
Calcium: 9.1 mg/dL (ref 8.9–10.3)
Chloride: 102 mmol/L (ref 98–111)
Creatinine, Ser: 4.96 mg/dL — ABNORMAL HIGH (ref 0.61–1.24)
GFR, Estimated: 13 mL/min — ABNORMAL LOW (ref >60)
Glucose, Bld: 251 mg/dL — ABNORMAL HIGH (ref 70–99)
Phosphorus: 5.3 mg/dL — ABNORMAL HIGH (ref 2.5–4.6)
Potassium: 4.1 mmol/L (ref 3.5–5.1)
Sodium: 138 mmol/L (ref 135–145)

## 2022-05-04 LAB — PROTIME-INR
INR: 1 (ref 0.8–1.2)
Prothrombin Time: 13.1 seconds (ref 11.4–15.2)

## 2022-05-04 LAB — APTT: aPTT: 33 seconds (ref 24–36)

## 2022-05-04 LAB — LIPASE, BLOOD: Lipase: 43 U/L (ref 11–51)

## 2022-05-04 LAB — GLUCOSE, CAPILLARY: Glucose-Capillary: 138 mg/dL — ABNORMAL HIGH (ref 70–99)

## 2022-05-04 MED ORDER — ATORVASTATIN CALCIUM 20 MG PO TABS
80.0000 mg | ORAL_TABLET | Freq: Every day | ORAL | Status: DC
  Administered 2022-05-05: 80 mg via ORAL
  Filled 2022-05-04: qty 4

## 2022-05-04 MED ORDER — ALTEPLASE 2 MG IJ SOLR: 2.0000 mg | Freq: Once | INTRAMUSCULAR | Status: DC | PRN

## 2022-05-04 MED ORDER — ACETAMINOPHEN 325 MG PO TABS: 650.0000 mg | ORAL_TABLET | Freq: Four times a day (QID) | ORAL | Status: DC | PRN

## 2022-05-04 MED ORDER — CHLORHEXIDINE GLUCONATE CLOTH 2 % EX PADS
6.0000 | MEDICATED_PAD | Freq: Every day | CUTANEOUS | Status: DC
  Administered 2022-05-05: 6 via TOPICAL

## 2022-05-04 MED ORDER — HEPARIN SODIUM (PORCINE) 5000 UNIT/ML IJ SOLN
5000.0000 [IU] | Freq: Three times a day (TID) | INTRAMUSCULAR | Status: DC
  Administered 2022-05-05 (×2): 5000 [IU] via SUBCUTANEOUS

## 2022-05-04 MED ORDER — SEVELAMER CARBONATE 800 MG PO TABS
800.0000 mg | ORAL_TABLET | Freq: Three times a day (TID) | ORAL | Status: DC
  Filled 2022-05-04: qty 1

## 2022-05-04 MED ORDER — HYDRALAZINE HCL 50 MG PO TABS
50.0000 mg | ORAL_TABLET | Freq: Two times a day (BID) | ORAL | Status: DC
  Administered 2022-05-05 (×2): 50 mg via ORAL
  Filled 2022-05-04 (×2): qty 1

## 2022-05-04 MED ORDER — INSULIN GLARGINE-YFGN 100 UNIT/ML ~~LOC~~ SOLN
10.0000 [IU] | Freq: Every day | SUBCUTANEOUS | Status: DC
  Administered 2022-05-05: 10 [IU] via SUBCUTANEOUS
  Filled 2022-05-04 (×2): qty 0.1

## 2022-05-04 MED ORDER — INSULIN ASPART 100 UNIT/ML IJ SOLN: 0.0000 [IU] | Freq: Every day | INTRAMUSCULAR | Status: DC

## 2022-05-04 MED ORDER — OXYCODONE-ACETAMINOPHEN 5-325 MG PO TABS: 1.0000 | ORAL_TABLET | ORAL | Status: DC | PRN

## 2022-05-04 MED ORDER — ASPIRIN 81 MG PO TBEC
81.0000 mg | DELAYED_RELEASE_TABLET | Freq: Every day | ORAL | Status: DC
  Administered 2022-05-05 (×2): 81 mg via ORAL
  Filled 2022-05-04 (×2): qty 1

## 2022-05-04 MED ORDER — AMLODIPINE BESYLATE 10 MG PO TABS
10.0000 mg | ORAL_TABLET | Freq: Every day | ORAL | Status: DC
  Administered 2022-05-05 (×2): 10 mg via ORAL
  Filled 2022-05-04 (×2): qty 1

## 2022-05-04 MED ORDER — HEPARIN SODIUM (PORCINE) 1000 UNIT/ML DIALYSIS
1000.0000 [IU] | INTRAMUSCULAR | Status: DC | PRN
  Administered 2022-05-05 (×2): 1000 [IU]
  Filled 2022-05-04: qty 1

## 2022-05-04 MED ORDER — HYDRALAZINE HCL 50 MG PO TABS
50.0000 mg | ORAL_TABLET | ORAL | Status: AC
Start: 2022-05-04 — End: 2022-05-04
  Administered 2022-05-04: 50 mg via ORAL
  Filled 2022-05-04: qty 1

## 2022-05-04 MED ORDER — HYDRALAZINE HCL 20 MG/ML IJ SOLN: 10.0000 mg | INTRAMUSCULAR | Status: DC | PRN

## 2022-05-04 MED ORDER — LIDOCAINE HCL (PF) 1 % IJ SOLN: 5.0000 mL | INTRAMUSCULAR | Status: DC | PRN

## 2022-05-04 MED ORDER — LIDOCAINE-PRILOCAINE 2.5-2.5 % EX CREA: 1.0000 | TOPICAL_CREAM | CUTANEOUS | Status: DC | PRN

## 2022-05-04 MED ORDER — MORPHINE SULFATE (PF) 2 MG/ML IV SOLN: 2.0000 mg | INTRAVENOUS | Status: DC | PRN

## 2022-05-04 MED ORDER — PREGABALIN 50 MG PO CAPS
100.0000 mg | ORAL_CAPSULE | Freq: Every day | ORAL | Status: DC
  Administered 2022-05-05: 100 mg via ORAL
  Filled 2022-05-04: qty 2

## 2022-05-04 MED ORDER — HYDROMORPHONE HCL 1 MG/ML IJ SOLN: 0.5000 mg | INTRAMUSCULAR | Status: DC

## 2022-05-04 MED ORDER — INSULIN ASPART 100 UNIT/ML IJ SOLN
0.0000 [IU] | Freq: Three times a day (TID) | INTRAMUSCULAR | Status: DC
  Administered 2022-05-05: 2 [IU] via SUBCUTANEOUS
  Filled 2022-05-04: qty 1

## 2022-05-04 MED ORDER — PENTAFLUOROPROP-TETRAFLUOROETH EX AERO: 1.0000 | INHALATION_SPRAY | CUTANEOUS | Status: DC | PRN

## 2022-05-04 MED ORDER — LEVOTHYROXINE SODIUM 50 MCG PO TABS: 175.0000 ug | ORAL_TABLET | Freq: Every day | ORAL | Status: DC

## 2022-05-04 MED ORDER — FENTANYL CITRATE PF 50 MCG/ML IJ SOSY
50.0000 ug | PREFILLED_SYRINGE | Freq: Once | INTRAMUSCULAR | Status: AC
  Administered 2022-05-04: 50 ug via INTRAVENOUS
  Filled 2022-05-04: qty 1

## 2022-05-04 MED ORDER — ANTICOAGULANT SODIUM CITRATE 4% (200MG/5ML) IV SOLN: 5.0000 mL | Status: DC | PRN

## 2022-05-04 NOTE — ED Triage Notes (Signed)
See first nurse note.   Pt states he did have a full treatment of dialysis on Thursday.   Pt states 2 hours ago he felt sick to his stomach with nausea but denies vomiting and also states some ABD pain. Pt denies fevers or chills.

## 2022-05-04 NOTE — Assessment & Plan Note (Signed)
Lipitor 

## 2022-05-04 NOTE — Assessment & Plan Note (Signed)
-   Aspirin, Lipitor

## 2022-05-04 NOTE — Plan of Care (Signed)

## 2022-05-04 NOTE — ED Notes (Signed)
Dr. Jacqualine Code at bedside assessing pt at this time.

## 2022-05-04 NOTE — ED Notes (Signed)
Report called to dialysis RN.

## 2022-05-04 NOTE — Consult Note (Addendum)
Referring Provider: No ref. provider found Primary Care Physician:  [name], MD Primary Nephrologist:  Dr. Rockwell Germany  Reason for Consultation: ESRD  HPI: 51 year old male with history of diabetes, peripheral vascular disease, hypertension, hypothyroidism, CVA, end-stage renal disease on dialysis on a Tuesday Thursday Saturday schedule at [facility] dialysis center.  He is now presenting to the emergency room with history of lower abdominal pain radiating to the pelvic area.  He had a CT scan done showing left-sided hydronephrosis.  Past Medical History:  Diagnosis Date   Allergy    Anemia    Bell's palsy    Diabetes mellitus without complication ([place])    diet controlled   Hypertension    Hypothyroidism    Kidney stones    Pseudotumor cerebri    Stroke [facility])     Past Surgical History:  Procedure Laterality Date   COLONOSCOPY WITH PROPOFOL N/A 03/30/2020   Procedure: COLONOSCOPY WITH PROPOFOL;  Surgeon: Lesly Rubenstein, MD;  Location: [facility] ENDOSCOPY;  Service: Endoscopy;  Laterality: N/A;   LOOP RECORDER INSERTION N/A 01/27/2018   Procedure: LOOP RECORDER INSERTION;  Surgeon: [name], MD;  Location: [place] [facility];  Service: Cardiovascular;  Laterality: N/A;   LUMBAR PUNCTURE     as child   NO PAST SURGERIES     TEE WITHOUT CARDIOVERSION N/A 01/07/2018   Procedure: TRANSESOPHAGEAL ECHOCARDIOGRAM (TEE);  Surgeon: Minna Merritts, MD;  Location: [facility] ORS;  Service: Cardiovascular;  Laterality: N/A;    Prior to Admission medications   Medication Sig Start Date End Date Taking? Authorizing Provider  Accu-Chek FastClix Lancets MISC USE TO CHECK BLOOD SUGAR THREE TIMES DAILY AS DIRECTED 05/31/20   Leone Haven, MD  amLODipine (NORVASC) 10 MG tablet Take 10 mg by mouth daily. 04/13/22   [provider]  aspirin (ASPIRIN LOW DOSE) 81 MG EC tablet TAKE 1 TABLET(81 MG) BY MOUTH DAILY 01/23/21   [name], MD  atorvastatin (LIPITOR) 80 MG tablet TAKE 1  TABLET(80 MG) BY MOUTH ONCE DAILY. 06/26/21   [name], MD  cyanocobalamin (,VITAMIN B-12,) 1000 MCG/ML injection INJECT 1 ML INTO THE MUSCLE ONCE A WEEK FOR 3 WEEKS. THEN 1ML EVERY 30 DAYS Patient not taking: Reported on 05/01/2022 06/26/21   [name], MD  glucose blood (FREESTYLE LITE) test strip Check blood sugars twice a day (Dx. 250.02) 04/18/17   [name], MD  hydrALAZINE (APRESOLINE) 50 MG tablet TAKE 1 TABLET(50 MG) BY MOUTH THREE TIMES DAILY Patient taking differently: in the morning and at bedtime. 02/25/22   [name], MD  Infant Care Products [facility]) OINT Apply to affected area bid prn 03/22/22   [name], MD  insulin glargine (LANTUS) 100 UNIT/ML injection Inject 0.13 mLs (13 Units total) into the skin once daily. 03/20/22   [name], MD  insulin lispro (HUMALOG) 100 UNIT/ML injection Inject 4-12 units three times daily before meals per sliding scale: If premeal sugar is <130, inject 4 units; if 131-180, inject 6 units; if 181-240, inject 8 units; if 241-300, inject 10 units, if  >300, inject 12 units (max daily dose 36 units) 03/20/22   [name], MD  Insulin Syringe-Needle U-100 (INSULIN SYRINGE .5CC/30GX5/16") 30G X 5/16" 0.5 ML MISC USE AS DIRECTED WITH INSULIN 04/26/22   [name], MD  levothyroxine (SYNTHROID) 175 MCG tablet TAKE 1 TABLET (175 MCG) BY MOUTH ONCE DAILY BEFORE BREAKFAST. 06/26/21   [name], MD  mupirocin ointment (BACTROBAN) 2 % Apply 1 Application topically 2 (two)  times daily. 03/22/22   [name], MD  pregabalin (LYRICA) 100 MG capsule TAKE 1 CAPSULE(100 MG) BY MOUTH THREE TIMES DAILY Patient taking differently: daily. 04/15/22   [name], MD  RENVELA 800 MG tablet Take 800 mg by mouth 3 (three) times daily. 04/23/22   [provider]  Syringe/Needle, Disp, (SYRINGE 3CC/25GX1") 25G X 1" 3 ML MISC Use as directed to administer b12 injections. 06/21/19   [name], MD    Current  Facility-Administered Medications  Medication Dose Route Frequency Provider Last Rate Last Admin   alteplase (CATHFLO ACTIVASE) injection 2 mg  2 mg Intracatheter Once PRN Lyla Son, MD       anticoagulant sodium citrate solution 5 mL  5 mL Intracatheter PRN Lyla Son, MD       Derrill Memo ON 05/05/2022] Chlorhexidine Gluconate Cloth 2 % PADS 6 each  6 each Topical Q0600 Vance Hochmuth, MD       heparin injection 1,000 Units  1,000 Units Intracatheter PRN Haelee Bolen, MD       lidocaine (PF) (XYLOCAINE) 1 % injection 5 mL  5 mL Intradermal PRN Lyla Son, MD       lidocaine-prilocaine (EMLA) cream 1 Application  1 Application Topical PRN Kortez Murtagh, MD       pentafluoroprop-tetrafluoroeth (GEBAUERS) aerosol 1 Application  1 Application Topical PRN Lyla Son, MD       Current Outpatient Medications  Medication Sig Dispense Refill   Accu-Chek FastClix Lancets MISC USE TO CHECK BLOOD SUGAR THREE TIMES DAILY AS DIRECTED 306 each 100   amLODipine (NORVASC) 10 MG tablet Take 10 mg by mouth daily.     aspirin (ASPIRIN LOW DOSE) 81 MG EC tablet TAKE 1 TABLET(81 MG) BY MOUTH DAILY 90 tablet 1   atorvastatin (LIPITOR) 80 MG tablet TAKE 1 TABLET(80 MG) BY MOUTH ONCE DAILY. 90 tablet 0   cyanocobalamin (,VITAMIN B-12,) 1000 MCG/ML injection INJECT 1 ML INTO THE MUSCLE ONCE A WEEK FOR 3 WEEKS. THEN 1ML EVERY 30 DAYS (Patient not taking: Reported on 05/01/2022) 10 mL 0   glucose blood (FREESTYLE LITE) test strip Check blood sugars twice a day (Dx. 250.02) 200 each 1   hydrALAZINE (APRESOLINE) 50 MG tablet TAKE 1 TABLET(50 MG) BY MOUTH THREE TIMES DAILY (Patient taking differently: in the morning and at bedtime.) 90 tablet 2   Infant Care Products (DERMACLOUD) OINT Apply to affected area bid prn 430 g 0   insulin glargine (LANTUS) 100 UNIT/ML injection Inject 0.13 mLs (13 Units total) into the skin once daily. 20 mL 2   insulin lispro (HUMALOG) 100 UNIT/ML injection Inject 4-12  units three times daily before meals per sliding scale: If premeal sugar is <130, inject 4 units; if 131-180, inject 6 units; if 181-240, inject 8 units; if 241-300, inject 10 units, if  >300, inject 12 units (max daily dose 36 units) 30 mL 3   Insulin Syringe-Needle U-100 (INSULIN SYRINGE .5CC/30GX5/16") 30G X 5/16" 0.5 ML MISC USE AS DIRECTED WITH INSULIN 300 each 5   levothyroxine (SYNTHROID) 175 MCG tablet TAKE 1 TABLET (175 MCG) BY MOUTH ONCE DAILY BEFORE BREAKFAST. 90 tablet 0   mupirocin ointment (BACTROBAN) 2 % Apply 1 Application topically 2 (two) times daily. 22 g 0   pregabalin (LYRICA) 100 MG capsule TAKE 1 CAPSULE(100 MG) BY MOUTH THREE TIMES DAILY (Patient taking differently: daily.) 90 capsule 1   RENVELA 800 MG tablet Take 800 mg by mouth 3 (three) times daily.     Syringe/Needle,  Disp, (SYRINGE 3CC/25GX1") 25G X 1" 3 ML MISC Use as directed to administer b12 injections. 59 each 1    Allergies as of 05/04/2022   (No Known Allergies)    Family History  Problem Relation Age of Onset   Breast cancer Mother    Diabetes Father    Diabetes Sister    Arthritis Maternal Grandmother    Diabetes Maternal Grandmother     Social History   Socioeconomic History   Marital status: Widowed    Spouse name: [name]   Number of children: 2   Years of education: Not on file   Highest education level: Not on file  Occupational History   Not on file  Tobacco Use   Smoking status: Never   Smokeless tobacco: Never  Vaping Use   Vaping Use: Never used  Substance and Sexual Activity   Alcohol use: No    Alcohol/week: 0.0 standard drinks of alcohol    Comment: very rarely (1-2/year)   Drug use: No   Sexual activity: Yes  Other Topics Concern   Not on file  Social History Narrative   Lives at home with wife, independent at baseline.   Social Determinants of Health   Financial Resource Strain: High Risk (06/26/2021)   Overall Financial Resource Strain (CARDIA)    Difficulty of  Paying Living Expenses: Hard  Food Insecurity: No Food Insecurity (09/13/2017)   Hunger Vital Sign    Worried About Running Out of Food in the Last Year: Never true    Ran Out of Food in the Last Year: Never true  Transportation Needs: No Transportation Needs (09/13/2017)   PRAPARE - Hydrologist (Medical): No    Lack of Transportation (Non-Medical): No  Physical Activity: Inactive (09/13/2017)   Exercise Vital Sign    Days of Exercise per Week: 0 days    Minutes of Exercise per Session: 0 min  Stress: Stress Concern Present (09/13/2017)   [place]    Feeling of Stress : To some extent  Social Connections: Moderately Integrated (09/13/2017)   Social Connection and Isolation Panel [NHANES]    Frequency of Communication with Friends and Family: More than three times a week    Frequency of Social Gatherings with Friends and Family: More than three times a week    Attends Religious Services: More than 4 times per year    Active Member of [employer] or Organizations: No    Attends Archivist Meetings: Never    Marital Status: Married  Human resources officer Violence: Unknown (01/06/2018)   Humiliation, Afraid, Rape, and Kick questionnaire    Fear of Current or Ex-Partner: Patient refused    Emotionally Abused: Patient refused    Physically Abused: Patient refused    Sexually Abused: Patient refused    Physical Exam: Vital signs in last 24 hours: Temp:  [97.7 F (36.5 C)] 97.7 F (36.5 C) (09/09 1137) Pulse Rate:  [83] 83 (09/09 1400) Resp:  [13-16] 15 (09/09 1400) BP: (158-182)/(72-74) 158/74 (09/09 1400) SpO2:  [92 %-98 %] 92 % (09/09 1400)   General:   Alert,  Well-developed, well-nourished, pleasant and cooperative in NAD Head:  Normocephalic and atraumatic. Eyes:  Sclera clear, no icterus.   Conjunctiva pink. Ears:  Normal auditory acuity. Nose:  No deformity, discharge,  or  lesions. Lungs:  Clear throughout to auscultation.   No wheezes, crackles, or rhonchi. No acute distress. Heart:  Regular rate and  rhythm; no murmurs, clicks, rubs,  or gallops. Abdomen:  Soft, nontender and nondistended. No masses, hepatosplenomegaly or hernias noted. Normal bowel sounds, without guarding, and without rebound.   Extremities: 1+ edema   Lab Results: Recent Labs    05/04/22 1140  WBC 11.8*  HGB 12.6*  HCT 38.2*  PLT 195   BMET Recent Labs    05/04/22 1140  NA 138  K 4.1  CL 103  CO2 21*  GLUCOSE 257*  BUN 42*  CREATININE 5.05*  CALCIUM 9.2   LFT Recent Labs    05/04/22 1140  PROT 8.3*  ALBUMIN 4.2  AST 18  ALT 12  ALKPHOS 77  BILITOT 0.4   PT/INR No results for input(s): "LABPROT", "INR" in the last 72 hours. Hepatitis Panel No results for input(s): "HEPBSAG", "HCVAB", "HEPAIGM", "HEPBIGM" in the last 72 hours.  Studies/Results: CT Renal Stone Study  Result Date: 05/04/2022 CLINICAL DATA:  Abdominal pain, back pain EXAM: CT ABDOMEN AND PELVIS WITHOUT CONTRAST TECHNIQUE: Multidetector CT imaging of the abdomen and pelvis was performed following the standard protocol without IV contrast. RADIATION DOSE REDUCTION: This exam was performed according to the departmental dose-optimization program which includes automated exposure control, adjustment of the mA and/or kV according to patient size and/or use of iterative reconstruction technique. COMPARISON:  Previous CT pelvis done on 12/12/2021 and renal sonogram done on 06/30/2019 FINDINGS: Lower chest: Heart is enlarged in size. No focal infiltrates are seen in the visualized lower lung fields. There is minimal pericardial effusion. Coronary artery calcifications are seen. Hepatobiliary: Liver measures 19.3 cm in length. No focal abnormalities are seen. There is no dilation of bile ducts. Surgical clips are seen in gallbladder fossa. Pancreas: No focal abnormalities are seen. Spleen: Spleen measures 17.2 cm in  maximum diameter. Adrenals/Urinary Tract: Adrenals are unremarkable. There is no hydronephrosis in the right kidney. There is moderate left hydronephrosis. There is possible tiny 1 mm calculus in the lower pole of left kidney. Calcification is seen in renal artery branch in the upper pole of right kidney. There is stranding in perinephric fat planes on both sides. Left ureter is dilated to the ureterovesical junction. There are no demonstrable opaque ureteral calculi. Urinary bladder is unremarkable. Stomach/Bowel: Small hiatal hernia is seen. Small bowel loops are not dilated. Appendix is not dilated. There is no significant wall thickening in colon. Vascular/Lymphatic: Scattered arterial calcifications are seen. Reproductive: Unremarkable. Other: There is no ascites or pneumoperitoneum. Musculoskeletal: No acute findings are seen. IMPRESSION: There is moderate left hydronephrosis. There are no demonstrable opaque ureteral calculi. Findings may suggest recent passage of left ureteral calculus or stricture at the left ureterovesical junction or obstruction by non opaque calculus. There is possible 1 mm calculus in the lower pole of left kidney. There is no hydronephrosis in the right kidney. There is significant perinephric stranding around both kidneys which may be due to recent or previous ureteric obstructions and pyelonephritis. There is no evidence of intestinal obstruction or pneumoperitoneum. Appendix is not dilated. Enlarged liver and spleen. Minimal pericardial effusion. Small hiatal hernia. Coronary artery calcifications are seen. Other findings as described in the body of the report. Electronically Signed   By: Elmer Picker M.D.   On: 05/04/2022 14:47    Assessment/Plan:   51 year old male with history of diabetes, peripheral vascular disease, hypertension, coronary artery disease, ESRD on hemodialysis, admitted with history of abdominal pain.  ESRD: Dialysis via permacath today for 3 hours.   We will attempt to remove 2  L of fluid. Patient also has a functioning left AV fistula.  ANEMIA: We will continue to monitor closely.  MBD: We will check PTH, calcium and phosphorus levels.  Continue sevelamer.  HTN/VOL: Advised to grams salt restricted diet.  Continue present antihypertensive medications.  ACCESS has left AV fistula.  Presently permacath has been used.  Abdominal pain: Patient had CT scan of the abdomen pelvis done which showed left hydronephrosis and the possibility of passing a renal stone.  Patient to be ruled out for urinary infection.  Will obtain urinalysis with culture.  Possible urology evaluation.     LOS: 0 Lyla Son, MD [facility] '@TODAY''@2'$ :59 PM

## 2022-05-04 NOTE — Progress Notes (Signed)
Pre HD RN assessment 

## 2022-05-04 NOTE — ED Notes (Signed)
Pt transported to CT via stretcher.  

## 2022-05-04 NOTE — Consult Note (Signed)
Subjective:    Consult requested by Dr. Ivor Costa.  Gregory Crane is a 51 yo diabetic with ESRD and HTN who had the onset this morning of severe left flank pain with some nausea.  3-4 weeks ago he had some issues with urinary urgency but that abated.  He had no hematuria or dysuria and denies voiding difficulty.   He has not gotten a urine specimen yet and last voided this morning.  He voids about 3 x daily.  He had a CT stone study that showed mild/mod left hydronephrosis with ureteral dilation into the pelvis without a clear point of obstruction and no ureteral stone was seen but there was a 29m left renal stone.  There is bilateral perinephric stranding.  The bladder volume was about 522mon CT at 1420.  He does report neuropathy with his diabetes.  He has had no prior GU surgery or UTI's.  His pain is currently controlled but he has some urgency.  He last had renal imaging in 2020 with a renal [place] no stones or hydro noted.  The bladder was moderately full on that study but there was no clear post void film done.  He does report passing a stone about 20 years ago.  ROS:  Review of Systems  Constitutional:  Negative for chills and fever.  Respiratory:  Negative for shortness of breath.   Cardiovascular:  Negative for chest pain.  Gastrointestinal:  Positive for abdominal pain.  Genitourinary:  Positive for flank pain and urgency.  Neurological:  Positive for sensory change.  All other systems reviewed and are negative.   No Known Allergies  Past Medical History:  Diagnosis Date   Allergy    Anemia    Bell's palsy    Diabetes mellitus without complication ([facility]   diet controlled   Hypertension    Hypothyroidism    Kidney stones    Pseudotumor cerebri    [facility]    Past Surgical History:  Procedure Laterality Date   COLONOSCOPY WITH PROPOFOL N/A 03/30/2020   Procedure: COLONOSCOPY WITH PROPOFOL;  Surgeon: LoLesly RubensteinMD;  Location: [facility] ENDOSCOPY;  Service: Endoscopy;   Laterality: N/A;   LOOP RECORDER INSERTION N/A 01/27/2018   Procedure: LOOP RECORDER INSERTION;  Surgeon: KlDeboraha SprangMD;  [facility];  Service: Cardiovascular;  Laterality: N/A;   LUMBAR PUNCTURE     as child   NO PAST SURGERIES     TEE WITHOUT CARDIOVERSION N/A 01/07/2018   Procedure: TRANSESOPHAGEAL ECHOCARDIOGRAM (TEE);  Surgeon: GoMinna MerrittsMD;  Location: [facility] ORS;  Service: Cardiovascular;  Laterality: N/A;    Social History   Socioeconomic History   Marital status: Widowed    Spouse name: ShLarene Beach Number of children: 2   Years of education: Not on file   Highest education level: Not on file  Occupational History   Not on file  Tobacco Use   Smoking status: Never   Smokeless tobacco: Never  Vaping Use   Vaping Use: Never used  Substance and Sexual Activity   Alcohol use: No    Alcohol/week: 0.0 standard drinks of alcohol    Comment: very rarely (1-2/year)   Drug use: No   Sexual activity: Yes  Other Topics Concern   Not on file  Social History Narrative   Lives at home with wife, independent at baseline.   Social Determinants of Health   Financial Resource Strain: High Risk (06/26/2021)   Overall Financial  Resource Strain (CARDIA)    Difficulty of Paying Living Expenses: Hard  Food Insecurity: No Food Insecurity (09/13/2017)   Hunger Vital Sign    Worried About Running Out of Food in the Last Year: Never true    Ran Out of Food in the Last Year: Never true  Transportation Needs: No Transportation Needs (09/13/2017)   PRAPARE - Hydrologist (Medical): No    Lack of Transportation (Non-Medical): No  Physical Activity: Inactive (09/13/2017)   Exercise Vital Sign    Days of Exercise per Week: 0 days    Minutes of Exercise per Session: 0 min  Stress: Stress Concern Present (09/13/2017)   [place]    Feeling of Stress : To some extent   Social Connections: Moderately Integrated (09/13/2017)   Social Connection and Isolation Panel [NHANES]    Frequency of Communication with Friends and Family: More than three times a week    Frequency of Social Gatherings with Friends and Family: More than three times a week    Attends Religious Services: More than 4 times per year    Active Member of [employer] or Organizations: No    Attends Archivist Meetings: Never    Marital Status: Married  Human resources officer Violence: Unknown (01/06/2018)   Humiliation, Afraid, Rape, and Kick questionnaire    Fear of Current or Ex-Partner: Patient refused    Emotionally Abused: Patient refused    Physically Abused: Patient refused    Sexually Abused: Patient refused    Family History  Problem Relation Age of Onset   Breast cancer Mother    Diabetes Father    Diabetes Sister    Arthritis Maternal Grandmother    Diabetes Maternal Grandmother     Anti-infectives: Anti-infectives (From admission, onward)    None       Current Facility-Administered Medications  Medication Dose Route Frequency Provider Last Rate Last Admin   acetaminophen (TYLENOL) tablet 650 mg  650 mg Oral Q6H PRN Ivor Costa, MD       alteplase (CATHFLO ACTIVASE) injection 2 mg  2 mg Intracatheter Once PRN Lyla Son, MD       anticoagulant sodium citrate solution 5 mL  5 mL Intracatheter PRN Lyla Son, MD       Derrill Memo ON 05/05/2022] Chlorhexidine Gluconate Cloth 2 % PADS 6 each  6 each Topical Q0600 Lyla Son, MD       heparin injection 1,000 Units  1,000 Units Intracatheter PRN Lyla Son, MD       heparin injection 5,000 Units  5,000 Units Subcutaneous Q8H Ivor Costa, MD       hydrALAZINE (APRESOLINE) injection 10 mg  10 mg Intravenous Q2H PRN Ivor Costa, MD       HYDROmorphone (DILAUDID) injection 0.5 mg  0.5 mg Intravenous STAT Delman Kitten, MD       insulin aspart (novoLOG) injection 0-5 Units  0-5 Units Subcutaneous QHS Ivor Costa, MD        insulin aspart (novoLOG) injection 0-9 Units  0-9 Units Subcutaneous TID WC Ivor Costa, MD       lidocaine (PF) (XYLOCAINE) 1 % injection 5 mL  5 mL Intradermal PRN Korrapati, Madhu, MD       lidocaine-prilocaine (EMLA) cream 1 Application  1 Application Topical PRN Korrapati, Madhu, MD       morphine (PF) 2 MG/ML injection 2 mg  2 mg Intravenous Q3H PRN Ivor Costa, MD  oxyCODONE-acetaminophen (PERCOCET/ROXICET) 5-325 MG per tablet 1 tablet  1 tablet Oral Q4H PRN Ivor Costa, MD       pentafluoroprop-tetrafluoroeth (GEBAUERS) aerosol 1 Application  1 Application Topical PRN Lyla Son, MD         Objective: Vital signs in last 24 hours: BP (!) 148/79 (BP Location: Right Arm)   Pulse 88   Temp 98.2 F (36.8 C) (Oral)   Resp 16   SpO2 96%   Intake/Output from previous day: No intake/output data recorded. Intake/Output this shift: Total I/O In: -  Out: 1500 [Other:1500]   Physical Exam Vitals reviewed.  Constitutional:      Appearance: He is well-developed. He is obese.  Cardiovascular:     Rate and Rhythm: Normal rate and regular rhythm.     Heart sounds: Normal heart sounds.  Pulmonary:     Effort: Pulmonary effort is normal. No respiratory distress.     Breath sounds: Normal breath sounds.  Abdominal:     General: There is no distension.     Palpations: Abdomen is soft. There is no mass.     Tenderness: There is left CVA tenderness (very mild).  Skin:    General: Skin is warm and dry.  Neurological:     General: No focal deficit present.     Mental Status: He is alert and oriented to person, place, and time.  Psychiatric:        Mood and Affect: Mood normal.        Behavior: Behavior normal.     Lab Results:  Results for orders placed or performed during the hospital encounter of 05/04/22 (from the past 24 hour(s))  Lipase, blood     Status: None   Collection Time: 05/04/22 11:40 AM  Result Value Ref Range   Lipase 43 11 - 51 U/L  Comprehensive  metabolic panel     Status: Abnormal   Collection Time: 05/04/22 11:40 AM  Result Value Ref Range   Sodium 138 135 - 145 mmol/L   Potassium 4.1 3.5 - 5.1 mmol/L   Chloride 103 98 - 111 mmol/L   CO2 21 (L) 22 - 32 mmol/L   Glucose, Bld 257 (H) 70 - 99 mg/dL   BUN 42 (H) 6 - 20 mg/dL   Creatinine, Ser 5.05 (H) 0.61 - 1.24 mg/dL   Calcium 9.2 8.9 - 10.3 mg/dL   Total Protein 8.3 (H) 6.5 - 8.1 g/dL   Albumin 4.2 3.5 - 5.0 g/dL   AST 18 15 - 41 U/L   ALT 12 0 - 44 U/L   Alkaline Phosphatase 77 38 - 126 U/L   Total Bilirubin 0.4 0.3 - 1.2 mg/dL   GFR, Estimated 13 (L) >60 mL/min   Anion gap 14 5 - 15  CBC     Status: Abnormal   Collection Time: 05/04/22 11:40 AM  Result Value Ref Range   WBC 11.8 (H) 4.0 - 10.5 K/uL   RBC 4.29 4.22 - 5.81 MIL/uL   Hemoglobin 12.6 (L) 13.0 - 17.0 g/dL   HCT 38.2 (L) 39.0 - 52.0 %   MCV 89.0 80.0 - 100.0 fL   MCH 29.4 26.0 - 34.0 pg   MCHC 33.0 30.0 - 36.0 g/dL   RDW 13.4 11.5 - 15.5 %   Platelets 195 150 - 400 K/uL   nRBC 0.0 0.0 - 0.2 %  Renal function panel     Status: Abnormal   Collection Time: 05/04/22 11:40 AM  Result Value Ref  Range   Sodium 138 135 - 145 mmol/L   Potassium 4.1 3.5 - 5.1 mmol/L   Chloride 102 98 - 111 mmol/L   CO2 22 22 - 32 mmol/L   Glucose, Bld 251 (H) 70 - 99 mg/dL   BUN 43 (H) 6 - 20 mg/dL   Creatinine, Ser 4.96 (H) 0.61 - 1.24 mg/dL   Calcium 9.1 8.9 - 10.3 mg/dL   Phosphorus 5.3 (H) 2.5 - 4.6 mg/dL   Albumin 4.2 3.5 - 5.0 g/dL   GFR, Estimated 13 (L) >60 mL/min   Anion gap 14 5 - 15    BMET Recent Labs    05/04/22 1140  NA 138  138  K 4.1  4.1  CL 102  103  CO2 22  21*  GLUCOSE 251*  257*  BUN 43*  42*  CREATININE 4.96*  5.05*  CALCIUM 9.1  9.2   PT/INR No results for input(s): "LABPROT", "INR" in the last 72 hours. ABG No results for input(s): "PHART", "HCO3" in the last 72 hours.  Invalid input(s): "PCO2", "PO2"  Studies/Results: CT Renal Stone Study  Result Date:  05/04/2022 CLINICAL DATA:  Abdominal pain, back pain EXAM: CT ABDOMEN AND PELVIS WITHOUT CONTRAST TECHNIQUE: Multidetector CT imaging of the abdomen and pelvis was performed following the standard protocol without IV contrast. RADIATION DOSE REDUCTION: This exam was performed according to the departmental dose-optimization program which includes automated exposure control, adjustment of the mA and/or kV according to patient size and/or use of iterative reconstruction technique. COMPARISON:  Previous CT pelvis done on 12/12/2021 and renal sonogram done on 06/30/2019 FINDINGS: Lower chest: Heart is enlarged in size. No focal infiltrates are seen in the visualized lower lung fields. There is minimal pericardial effusion. Coronary artery calcifications are seen. Hepatobiliary: Liver measures 19.3 cm in length. No focal abnormalities are seen. There is no dilation of bile ducts. Surgical clips are seen in gallbladder fossa. Pancreas: No focal abnormalities are seen. Spleen: Spleen measures 17.2 cm in maximum diameter. Adrenals/Urinary Tract: Adrenals are unremarkable. There is no hydronephrosis in the right kidney. There is moderate left hydronephrosis. There is possible tiny 1 mm calculus in the lower pole of left kidney. Calcification is seen in renal artery branch in the upper pole of right kidney. There is stranding in perinephric fat planes on both sides. Left ureter is dilated to the ureterovesical junction. There are no demonstrable opaque ureteral calculi. Urinary bladder is unremarkable. Stomach/Bowel: Small hiatal hernia is seen. Small bowel loops are not dilated. Appendix is not dilated. There is no significant wall thickening in colon. Vascular/Lymphatic: Scattered arterial calcifications are seen. Reproductive: Unremarkable. Other: There is no ascites or pneumoperitoneum. Musculoskeletal: No acute findings are seen. IMPRESSION: There is moderate left hydronephrosis. There are no demonstrable opaque ureteral  calculi. Findings may suggest recent passage of left ureteral calculus or stricture at the left ureterovesical junction or obstruction by non opaque calculus. There is possible 1 mm calculus in the lower pole of left kidney. There is no hydronephrosis in the right kidney. There is significant perinephric stranding around both kidneys which may be due to recent or previous ureteric obstructions and pyelonephritis. There is no evidence of intestinal obstruction or pneumoperitoneum. Appendix is not dilated. Enlarged liver and spleen. Minimal pericardial effusion. Small hiatal hernia. Coronary artery calcifications are seen. Other findings as described in the body of the report. Electronically Signed   By: Elmer Picker M.D.   On: 05/04/2022 14:47    I discussed his  case with Dr. Jacqualine Code and reviewed the pertinent imaging, labs and notes.   Assessment/Plan: Acute left flank pain with  hydro without clear point of obstruction.  No stone seen.  The ureter is tortuous which suggests a chronic component of obstruction.  He could have obstruction from a clot, a neoplasm or possibly secondary to papillary necrosis.  Interval passage of a stone is possibly as well but less likely.   He will need cystoscopy with retrograde pyelography and possible ureteroscopy and stent.  His pain is controlled so the procedure is non-emergent.   Please keep him NPO after MN and I will notify Dr. Erlene Quan of the consult.    Urinary urgency.  He has had some issues with urgency.  His bladder was moderately full on CT and he has not voided since admission.  He will need a bladder scan after voiding to assess PVR.   If he does have a high residual, that alone could contribute to hydronephrosis.   He will need a UA sent when he voids and if he is unable, he may need to be cathed.       No follow-ups on file.    CC: Dr. Ivor Costa.      Irine Seal 05/04/2022

## 2022-05-04 NOTE — Assessment & Plan Note (Addendum)
CT scan showed moderate left hydronephrosis without obstructive stone.  Patient may have passed a stone.  [facility], Dr. Jeffie Pollock -will place in med-surg bed for obs -Pain control: As needed morphine and Tylenol, Percocet -f/u UA

## 2022-05-04 NOTE — Assessment & Plan Note (Signed)
Synthroid 

## 2022-05-04 NOTE — Progress Notes (Signed)
Pt 3.25 hr HD complete w/ no complications. Pt alert, no c/o, vss, report to primary RN.  Start 1553 End: 1910 2078m fluid removed 58.5L BVP UTA standing weight d/t patient weakness/fatigue. Pt on ED stretcher. Unable to weigh.  No meds ordered for HD.

## 2022-05-04 NOTE — ED Triage Notes (Signed)
Pt in via EMS from home with c/o lower abd pain around his umbilical down to pelvis described as pressure. Pt missed dialysis today due to the pain. Left arm dialysis fistula. #20g to r AC, pt was given 91mg of fentanyl. 200/90HR 84, 100% RA

## 2022-05-04 NOTE — H&P (Signed)
History and Physical    Gregory Crane JSE:[id number] DOB: Sep 06, 1970 DOA: 05/04/2022  Referring MD/NP/PA:   PCP: [name], MD   Patient coming from:  The patient is coming from home.  At baseline, pt is independent for most of ADL.        Chief Complaint:left flank pain  HPI: Gregory Crane is a 51 y.o. male with medical history significant of ESRD-HD (TTS), hypertension, hyperlipidemia, diabetes mellitus, kidney stone, Bell's palsy, pseudotumor cerebri, peripheral neuropathy, BPH, hypothyroidism, stroke, who presents with left flank pain.  Patient states that he started having left flank pain in this morning, also has some left-sided abdominal pain.  The flank pain is sharp, severe, constant, nonradiating.  Patient has nausea, no vomiting or diarrhea.  No fever or chills.  No hematuria.  Patient has urge to urinate.  No dysuria or burning on urination.  Denies chest pain, cough, shortness breath.  Patient had dialysis on Thursday.  Data reviewed independently and ED Course: pt was found to have WBC 11.8, potassium 4.1, bicarbonate 31, creatinine 5.05, BUN 42, temperature normal, blood pressure 200/90, 182/74, 162/82, heart rate 83, RR 16, oxygen saturation 97% on room air.  CT scan per renal stone protocol showed moderate left hydronephrosis without obstructive stone.  Patient is placed on telemetry bed for patient, Dr. Lanora Manis of renal and Dr. Jeffie Pollock of urology are consulted.  CT-renal stone: There is moderate left hydronephrosis. There are no demonstrable opaque ureteral calculi. Findings may suggest recent passage of left ureteral calculus or stricture at the left ureterovesical junction or obstruction by non opaque calculus. There is possible 1 mm calculus in the lower pole of left kidney. There is no hydronephrosis in the right kidney. There is significant perinephric stranding around both kidneys which may be due to recent or previous ureteric obstructions and  pyelonephritis.   There is no evidence of intestinal obstruction or pneumoperitoneum. Appendix is not dilated.   Enlarged liver and spleen. Minimal pericardial effusion. Small hiatal hernia. Coronary artery calcifications are seen. Other findings as described in the body of the report.   EKG: Not done in ED, will get one.    Review of Systems:   General: no fevers, chills, no body weight gain, fatigue HEENT: no blurry vision, hearing changes or sore throat Respiratory: no dyspnea, coughing, wheezing CV: no chest pain, no palpitations GI: has nausea, left sided abdominal pain, no vomiting or diarrhea, constipation GU: no dysuria, burning on urination, hematuria. Ext: no leg edema Neuro: no unilateral weakness, numbness, or tingling, no vision change or hearing loss Skin: no rash, no skin tear. MSK: No muscle spasm, no deformity, no limitation of range of movement in spin. Has left flank pain Heme: No easy bruising.  Travel history: No recent long distant travel.   Allergy: No Known Allergies  Past Medical History:  Diagnosis Date   Allergy    Anemia    Bell's palsy    Diabetes mellitus without complication ([place])    diet controlled   Hypertension    Hypothyroidism    Kidney stones    Pseudotumor cerebri    Stroke [facility])     Past Surgical History:  Procedure Laterality Date   COLONOSCOPY WITH PROPOFOL N/A 03/30/2020   Procedure: COLONOSCOPY WITH PROPOFOL;  Surgeon: Lesly Rubenstein, MD;  Location: [facility] ENDOSCOPY;  Service: Endoscopy;  Laterality: N/A;   LOOP RECORDER INSERTION N/A 01/27/2018   Procedure: LOOP RECORDER INSERTION;  Surgeon: [name], MD;  Location: [facility]  INVASIVE CV LAB;  Service: Cardiovascular;  Laterality: N/A;   LUMBAR PUNCTURE     as child   NO PAST SURGERIES     TEE WITHOUT CARDIOVERSION N/A 01/07/2018   Procedure: TRANSESOPHAGEAL ECHOCARDIOGRAM (TEE);  Surgeon: Minna Merritts, MD;  Location: [facility] ORS;  Service: Cardiovascular;   Laterality: N/A;    Social History:  reports that he has never smoked. He has never used smokeless tobacco. He reports that he does not drink alcohol and does not use drugs.  Family History:  Family History  Problem Relation Age of Onset   Breast cancer Mother    Diabetes Father    Diabetes Sister    Arthritis Maternal Grandmother    Diabetes Maternal Grandmother      Prior to Admission medications   Medication Sig Start Date End Date Taking? Authorizing Provider  Accu-Chek FastClix Lancets MISC USE TO CHECK BLOOD SUGAR THREE TIMES DAILY AS DIRECTED 05/31/20   Leone Haven, MD  amLODipine (NORVASC) 10 MG tablet Take 10 mg by mouth daily. 04/13/22   [provider]  aspirin (ASPIRIN LOW DOSE) 81 MG EC tablet TAKE 1 TABLET(81 MG) BY MOUTH DAILY 01/23/21   [name], MD  atorvastatin (LIPITOR) 80 MG tablet TAKE 1 TABLET(80 MG) BY MOUTH ONCE DAILY. 06/26/21   [name], MD  cyanocobalamin (,VITAMIN B-12,) 1000 MCG/ML injection INJECT 1 ML INTO THE MUSCLE ONCE A WEEK FOR 3 WEEKS. THEN 1ML EVERY 30 DAYS Patient not taking: Reported on 05/01/2022 06/26/21   [name], MD  glucose blood (FREESTYLE LITE) test strip Check blood sugars twice a day (Dx. 250.02) 04/18/17   [name], MD  hydrALAZINE (APRESOLINE) 50 MG tablet TAKE 1 TABLET(50 MG) BY MOUTH THREE TIMES DAILY Patient taking differently: in the morning and at bedtime. 02/25/22   [name], MD  Infant Care Products [facility]) OINT Apply to affected area bid prn 03/22/22   [name], MD  insulin glargine (LANTUS) 100 UNIT/ML injection Inject 0.13 mLs (13 Units total) into the skin once daily. 03/20/22   [name], MD  insulin lispro (HUMALOG) 100 UNIT/ML injection Inject 4-12 units three times daily before meals per sliding scale: If premeal sugar is <130, inject 4 units; if 131-180, inject 6 units; if 181-240, inject 8 units; if 241-300, inject 10 units, if  >300, inject 12 units (max daily  dose 36 units) 03/20/22   [name], MD  Insulin Syringe-Needle U-100 (INSULIN SYRINGE .5CC/30GX5/16") 30G X 5/16" 0.5 ML MISC USE AS DIRECTED WITH INSULIN 04/26/22   [name], MD  levothyroxine (SYNTHROID) 175 MCG tablet TAKE 1 TABLET (175 MCG) BY MOUTH ONCE DAILY BEFORE BREAKFAST. 06/26/21   [name], MD  mupirocin ointment (BACTROBAN) 2 % Apply 1 Application topically 2 (two) times daily. 03/22/22   [name], MD  pregabalin (LYRICA) 100 MG capsule TAKE 1 CAPSULE(100 MG) BY MOUTH THREE TIMES DAILY Patient taking differently: daily. 04/15/22   [name], MD  RENVELA 800 MG tablet Take 800 mg by mouth 3 (three) times daily. 04/23/22   [provider]  Syringe/Needle, Disp, (SYRINGE 3CC/25GX1") 25G X 1" 3 ML MISC Use as directed to administer b12 injections. 06/21/19   [name], MD    Physical Exam: Vitals:   05/04/22 1820 05/04/22 1850 05/04/22 1910 05/04/22 1913  BP: (!) 154/77 (!) 143/73 (!) 151/80 (!) 148/79  Pulse: 95 94 94 88  Resp: '20 18 19 16  '$ Temp:   98.2 F (36.8 C)  TempSrc:   Oral   SpO2: 97% 94% 96% 96%   General: Not in acute distress HEENT:       Eyes: PERRL, EOMI, no scleral icterus.       ENT: No discharge from the ears and nose, no pharynx injection, no tonsillar enlargement.        Neck: No JVD, no bruit, no mass felt. Heme: No neck lymph node enlargement. Cardiac: S1/S2, RRR, No murmurs, No gallops or rubs. Respiratory: No rales, wheezing, rhonchi or rubs. GI: Soft, nondistended, nontender, no rebound pain, no organomegaly, BS present. GU: No hematuria.  Ext: No pitting leg edema bilaterally. 1+DP/PT pulse bilaterally. Musculoskeletal: No joint deformities, No joint redness or warmth, no limitation of ROM in spin. Skin: No rashes.  Neuro: Alert, oriented X3, cranial nerves II-XII grossly intact, moves all extremities normally. Psych: Patient is not psychotic, no suicidal or hemocidal ideation.  Labs on Admission: I  have personally reviewed following labs and imaging studies  CBC: Recent Labs  Lab 05/04/22 1140  WBC 11.8*  HGB 12.6*  HCT 38.2*  MCV 89.0  PLT 062   Basic Metabolic Panel: Recent Labs  Lab 05/04/22 1140  NA 138  138  K 4.1  4.1  CL 102  103  CO2 22  21*  GLUCOSE 251*  257*  BUN 43*  42*  CREATININE 4.96*  5.05*  CALCIUM 9.1  9.2  PHOS 5.3*   GFR: Estimated Creatinine Clearance: 22.4 mL/min (A) (by C-G formula based on SCr of 5.05 mg/dL (H)). Liver Function Tests: Recent Labs  Lab 05/04/22 1140  AST 18  ALT 12  ALKPHOS 77  BILITOT 0.4  PROT 8.3*  ALBUMIN 4.2  4.2   Recent Labs  Lab 05/04/22 1140  LIPASE 43   No results for input(s): "AMMONIA" in the last 168 hours. Coagulation Profile: No results for input(s): "INR", "PROTIME" in the last 168 hours. Cardiac Enzymes: No results for input(s): "CKTOTAL", "CKMB", "CKMBINDEX", "TROPONINI" in the last 168 hours. BNP (last 3 results) No results for input(s): "PROBNP" in the last 8760 hours. HbA1C: No results for input(s): "HGBA1C" in the last 72 hours. CBG: No results for input(s): "GLUCAP" in the last 168 hours. Lipid Profile: No results for input(s): "CHOL", "HDL", "LDLCALC", "TRIG", "CHOLHDL", "LDLDIRECT" in the last 72 hours. Thyroid Function Tests: No results for input(s): "TSH", "T4TOTAL", "FREET4", "T3FREE", "THYROIDAB" in the last 72 hours. Anemia Panel: No results for input(s): "VITAMINB12", "FOLATE", "FERRITIN", "TIBC", "IRON", "RETICCTPCT" in the last 72 hours. Urine analysis:    Component Value Date/Time   COLORURINE YELLOW (A) 09/12/2017 1722   APPEARANCEUR HAZY (A) 09/12/2017 1722   APPEARANCEUR Clear 01/27/2014 0247   LABSPEC 1.020 09/12/2017 1722   LABSPEC 1.033 01/27/2014 0247   PHURINE 5.0 09/12/2017 1722   GLUCOSEU >=500 (A) 09/12/2017 1722   GLUCOSEU >=500 01/27/2014 0247   HGBUR SMALL (A) 09/12/2017 1722   BILIRUBINUR NEGATIVE 09/12/2017 1722   BILIRUBINUR Negative  01/27/2014 0247   KETONESUR 20 (A) 09/12/2017 1722   PROTEINUR 100 (A) 09/12/2017 1722   NITRITE NEGATIVE 09/12/2017 1722   LEUKOCYTESUR NEGATIVE 09/12/2017 1722   LEUKOCYTESUR Negative 01/27/2014 0247   Sepsis Labs: '@LABRCNTIP'$ (procalcitonin:4,lacticidven:4) )No results found for this or any previous visit (from the past 240 hour(s)).   Radiological Exams on Admission: CT Renal Stone Study  Result Date: 05/04/2022 CLINICAL DATA:  Abdominal pain, back pain EXAM: CT ABDOMEN AND PELVIS WITHOUT CONTRAST TECHNIQUE: Multidetector CT imaging of the abdomen and pelvis was performed  following the standard protocol without IV contrast. RADIATION DOSE REDUCTION: This exam was performed according to the departmental dose-optimization program which includes automated exposure control, adjustment of the mA and/or kV according to patient size and/or use of iterative reconstruction technique. COMPARISON:  Previous CT pelvis done on 12/12/2021 and renal sonogram done on 06/30/2019 FINDINGS: Lower chest: Heart is enlarged in size. No focal infiltrates are seen in the visualized lower lung fields. There is minimal pericardial effusion. Coronary artery calcifications are seen. Hepatobiliary: Liver measures 19.3 cm in length. No focal abnormalities are seen. There is no dilation of bile ducts. Surgical clips are seen in gallbladder fossa. Pancreas: No focal abnormalities are seen. Spleen: Spleen measures 17.2 cm in maximum diameter. Adrenals/Urinary Tract: Adrenals are unremarkable. There is no hydronephrosis in the right kidney. There is moderate left hydronephrosis. There is possible tiny 1 mm calculus in the lower pole of left kidney. Calcification is seen in renal artery branch in the upper pole of right kidney. There is stranding in perinephric fat planes on both sides. Left ureter is dilated to the ureterovesical junction. There are no demonstrable opaque ureteral calculi. Urinary bladder is unremarkable.  Stomach/Bowel: Small hiatal hernia is seen. Small bowel loops are not dilated. Appendix is not dilated. There is no significant wall thickening in colon. Vascular/Lymphatic: Scattered arterial calcifications are seen. Reproductive: Unremarkable. Other: There is no ascites or pneumoperitoneum. Musculoskeletal: No acute findings are seen. IMPRESSION: There is moderate left hydronephrosis. There are no demonstrable opaque ureteral calculi. Findings may suggest recent passage of left ureteral calculus or stricture at the left ureterovesical junction or obstruction by non opaque calculus. There is possible 1 mm calculus in the lower pole of left kidney. There is no hydronephrosis in the right kidney. There is significant perinephric stranding around both kidneys which may be due to recent or previous ureteric obstructions and pyelonephritis. There is no evidence of intestinal obstruction or pneumoperitoneum. Appendix is not dilated. Enlarged liver and spleen. Minimal pericardial effusion. Small hiatal hernia. Coronary artery calcifications are seen. Other findings as described in the body of the report. Electronically Signed   By: Elmer Picker M.D.   On: 05/04/2022 14:47      Assessment/Plan Principal Problem:   Hydronephrosis, left Active Problems:   ESRD (end stage renal disease) ([place])   Essential hypertension   History of CVA (cerebrovascular accident)   Hyperlipidemia   Hypothyroidism   Leukocytosis   Type II diabetes mellitus with renal manifestations (HCC)   Assessment and Plan: * Hydronephrosis, left CT scan showed moderate left hydronephrosis without obstructive stone.  Patient may have passed a stone.  Consulted urology, Dr. Jeffie Pollock -will place in med-surg bed for obs -Pain control: As needed morphine and Tylenol, Percocet -f/u UA  ESRD (end stage renal disease) (HCC) - Consulted Dr. Joylene Naseer of renal for dialysis  Essential hypertension - IV hydralazine as needed -Amlodipine,  oral hydralazine,  History of CVA (cerebrovascular accident) - Aspirin, Lipitor  Hyperlipidemia - Lipitor  Hypothyroidism - Synthroid  Leukocytosis WBC 11.8, no source of infection identified so far. -Follow-up urinalysis  Type II diabetes mellitus with renal manifestations (HCC) Recent A1c 6.9, well controlled.  Patient taking Humalog, Lantus 13 units daily -Sliding scale insulin -Glargine insulin 10 units daily          DVT ppx: SQ Heparin    Code Status: Full code  Family Communication: not done, no family member is at bed side.    Disposition Plan:  Anticipate discharge back to  previous environment  Consults called:  Dr. Clayton Bibles of renal and Dr. Jeffie Pollock of urology  Admission status and Level of care: Telemetry Medical:   for obs  SDU/inpation          Dispo: The patient is from: Home              Anticipated d/c is to: Home              Anticipated d/c date is: 1 day              Patient currently is not medically stable to d/c.    Severity of Illness:  The appropriate patient status for this patient is OBSERVATION. Observation status is judged to be reasonable and necessary in order to provide the required intensity of service to ensure the patient's safety. The patient's presenting symptoms, physical exam findings, and initial radiographic and laboratory data in the context of their medical condition is felt to place them at decreased risk for further clinical deterioration. Furthermore, it is anticipated that the patient will be medically stable for discharge from the hospital within 2 midnights of admission.        Date of Service 05/04/2022    [facility]   If 7PM-7AM, please contact night-coverage [email or web address] 05/04/2022, 7:32 PM

## 2022-05-04 NOTE — Assessment & Plan Note (Signed)
Recent A1c 6.9, well controlled.  Patient taking Humalog, Lantus 13 units daily -Sliding scale insulin -Glargine insulin 10 units daily

## 2022-05-04 NOTE — Assessment & Plan Note (Signed)
WBC 11.8, no source of infection identified so far. -Follow-up urinalysis

## 2022-05-04 NOTE — ED Provider Notes (Signed)
[facility] Provider Note    Event Date/Time   First MD Initiated Contact with Patient 05/04/22 1301     (approximate)   History   Abdominal Pain and Nausea   HPI Gregory Crane is a 51 y.o. male history of end-stage renal disease on 3 days a week dialysis, diabetes hypertension hypothyroidism and kidney stones and a previous stroke  Patient reports about 2 hours prior to arrival he had a sudden pain in his mid to lower abdomen and pelvis that felt very sharp nauseating and induced dry heaving.  He reports he had a urge to urinate followed by an urge to feel like he needed to defecate.  He called EMS.  He reports his pain is much better for the pain medicine that he was administered, fentanyl.  Still having moderate pain now located lower in his pelvis and lower in his back and a slight urge to urinate with it.  Denies any fevers or chills.  He has been in his normal state, and plan to dialyze today but because of his pain he called and canceled his dialysis center appointment.  No chest pain.  No trouble breathing.  He is otherwise been well until this sudden rather abrupt onset 2 hours ago.  He has not vomited but felt like he was dry heaving though symptoms have abated.  No new numbness in his feet.  No cold or pale feet.     Physical Exam   Triage Vital Signs: ED Triage Vitals  Enc Vitals Group     BP 05/04/22 1137 (!) 175/72     Pulse Rate 05/04/22 1137 83     Resp 05/04/22 1137 16     Temp 05/04/22 1137 97.7 F (36.5 C)     Temp Source 05/04/22 1137 Oral     SpO2 05/04/22 1137 98 %     Weight --      Height --      Head Circumference --      Peak Flow --      Pain Score 05/04/22 1138 6     Pain Loc --      Pain Edu? --      Excl. in [place]? --     Most recent vital signs: Vitals:   05/04/22 1400 05/04/22 1500  BP: (!) 158/74 (!) 162/82  Pulse: 83 83  Resp: 15 16  Temp:    SpO2: 92% 95%     General: Awake, no distress.  Very  pleasant.  Well oriented. CV:  Good peripheral perfusion.  Left upper extremity fistula.  Normal heart tones Resp:  Normal effort.  Clear bilateral Abd:  No distention.  Soft nontender nondistended throughout though he does report a sense of urgency and slight discomfort in his lower back and pelvic region to palpation in the suprapubic area.  No noted costovertebral tenderness to percussion bilaterally.  No rashes.  Normal appearance of the penis and testicles  Other:  Warm, well-perfused lower extremities with palpable dorsalis pedis pulses bilateral.  He has not noticed any pain or burning with urination.  Reports a slight sense of urgency  ED Results / Procedures / Treatments   Labs (all labs ordered are listed, but only abnormal results are displayed) Labs Reviewed  COMPREHENSIVE METABOLIC PANEL - Abnormal; Notable for the following components:      Result Value   CO2 21 (*)    Glucose, Bld 257 (*)    BUN 42 (*)  Creatinine, Ser 5.05 (*)    Total Protein 8.3 (*)    GFR, Estimated 13 (*)    All other components within normal limits  CBC - Abnormal; Notable for the following components:   WBC 11.8 (*)    Hemoglobin 12.6 (*)    HCT 38.2 (*)    All other components within normal limits  URINE CULTURE  LIPASE, BLOOD  URINALYSIS, ROUTINE W REFLEX MICROSCOPIC  RENAL FUNCTION PANEL  CBC  HEPATITIS B SURFACE ANTIGEN  HEPATITIS B SURFACE ANTIBODY,QUALITATIVE  HEPATITIS B SURFACE ANTIBODY, QUANTITATIVE  HEPATITIS B CORE ANTIBODY, TOTAL  HEPATITIS C ANTIBODY  PROTIME-INR  APTT  HIV ANTIBODY (ROUTINE TESTING W REFLEX)   Results reviewed, creatinine 5 BUN 40 and glucose 250, all within expectation of the patient's reported history of end-stage renal disease.  White blood cell count minimally elevated 11.8.  Mild anemia 12.6.  Platelets 195.  Anemia appears to be chronic  EKG     RADIOLOGY    CT Renal Stone Study  Result Date: 05/04/2022 CLINICAL DATA:  Abdominal pain,  back pain EXAM: CT ABDOMEN AND PELVIS WITHOUT CONTRAST TECHNIQUE: Multidetector CT imaging of the abdomen and pelvis was performed following the standard protocol without IV contrast. RADIATION DOSE REDUCTION: This exam was performed according to the departmental dose-optimization program which includes automated exposure control, adjustment of the mA and/or kV according to patient size and/or use of iterative reconstruction technique. COMPARISON:  Previous CT pelvis done on 12/12/2021 and renal sonogram done on 06/30/2019 FINDINGS: Lower chest: Heart is enlarged in size. No focal infiltrates are seen in the visualized lower lung fields. There is minimal pericardial effusion. Coronary artery calcifications are seen. Hepatobiliary: Liver measures 19.3 cm in length. No focal abnormalities are seen. There is no dilation of bile ducts. Surgical clips are seen in gallbladder fossa. Pancreas: No focal abnormalities are seen. Spleen: Spleen measures 17.2 cm in maximum diameter. Adrenals/Urinary Tract: Adrenals are unremarkable. There is no hydronephrosis in the right kidney. There is moderate left hydronephrosis. There is possible tiny 1 mm calculus in the lower pole of left kidney. Calcification is seen in renal artery branch in the upper pole of right kidney. There is stranding in perinephric fat planes on both sides. Left ureter is dilated to the ureterovesical junction. There are no demonstrable opaque ureteral calculi. Urinary bladder is unremarkable. Stomach/Bowel: Small hiatal hernia is seen. Small bowel loops are not dilated. Appendix is not dilated. There is no significant wall thickening in colon. Vascular/Lymphatic: Scattered arterial calcifications are seen. Reproductive: Unremarkable. Other: There is no ascites or pneumoperitoneum. Musculoskeletal: No acute findings are seen. IMPRESSION: There is moderate left hydronephrosis. There are no demonstrable opaque ureteral calculi. Findings may suggest recent  passage of left ureteral calculus or stricture at the left ureterovesical junction or obstruction by non opaque calculus. There is possible 1 mm calculus in the lower pole of left kidney. There is no hydronephrosis in the right kidney. There is significant perinephric stranding around both kidneys which may be due to recent or previous ureteric obstructions and pyelonephritis. There is no evidence of intestinal obstruction or pneumoperitoneum. Appendix is not dilated. Enlarged liver and spleen. Minimal pericardial effusion. Small hiatal hernia. Coronary artery calcifications are seen. Other findings as described in the body of the report. Electronically Signed   By: Elmer Picker M.D.   On: 05/04/2022 14:47      PROCEDURES:  Critical Care performed: No  Procedures   MEDICATIONS ORDERED IN ED: Medications  Chlorhexidine  Gluconate Cloth 2 % PADS 6 each (has no administration in time range)  pentafluoroprop-tetrafluoroeth (GEBAUERS) aerosol 1 Application (has no administration in time range)  lidocaine (PF) (XYLOCAINE) 1 % injection 5 mL (has no administration in time range)  lidocaine-prilocaine (EMLA) cream 1 Application (has no administration in time range)  heparin injection 1,000 Units (has no administration in time range)  anticoagulant sodium citrate solution 5 mL (has no administration in time range)  alteplase (CATHFLO ACTIVASE) injection 2 mg (has no administration in time range)  HYDROmorphone (DILAUDID) injection 0.5 mg (has no administration in time range)  morphine (PF) 2 MG/ML injection 2 mg (has no administration in time range)  oxyCODONE-acetaminophen (PERCOCET/ROXICET) 5-325 MG per tablet 1 tablet (has no administration in time range)  acetaminophen (TYLENOL) tablet 650 mg (has no administration in time range)  hydrALAZINE (APRESOLINE) injection 10 mg (has no administration in time range)  insulin aspart (novoLOG) injection 0-9 Units (has no administration in time range)   insulin aspart (novoLOG) injection 0-5 Units (has no administration in time range)  heparin injection 5,000 Units (has no administration in time range)  fentaNYL (SUBLIMAZE) injection 50 mcg (50 mcg Intravenous Given 05/04/22 1315)  hydrALAZINE (APRESOLINE) tablet 50 mg (50 mg Oral Given 05/04/22 1315)     IMPRESSION / MDM / ASSESSMENT AND PLAN / ED COURSE  I reviewed the triage vital signs and the nursing notes.                              Differential diagnosis includes but is not limited to, abdominal perforation, aortic dissection, cholecystitis, appendicitis, diverticulitis, colitis, esophagitis/gastritis, kidney stone, pyelonephritis, urinary tract infection, aortic aneurysm. All are considered in decision and treatment plan. Based upon the patient's presentation and risk factors, and the rather abrupt presentation with slight sense of urgency and lower back pain and history of kidney stones I am inclined to think this could be an acute nephrolithiasis and thus we will proceed with CT stone study which I discussed with the patient.  Pain control with fentanyl.  However his differential is broad and certainly vascular other intra-abdominal etiologies are considered.  I discussed specifically with the patient that if his initial CT did not show causation, we would need to consider a CT scan with IV dye to evaluate for other causes of pain in particular vascular causes such as mesenteric ischemia, dissection aneurysm etc.  However given the patient is not anuric, remains on hemodialysis it would seem strongly preferable to obtain imaging without contrast at this juncture.  He does not appear acutely unstable to suggest ruptured aneurysm in his distal examination does not show evidence of obvious acute vascular compromise.   UA pending at time of admission decision, Dr. Blaine Hamper aware of this pending as well as a pending routine consult to urology which I have discussed with Dr. Jeffie Pollock.  He advised he  anticipates that he will speak with the patient's nephrologist, but upon his review sees no evidence of acute need for neurosurgical intervention at this time.  Urology consultation on a routine basis anticipated   Patient's presentation is most consistent with acute presentation with potential threat to life or bodily function.  The patient is on the cardiac monitor to evaluate for evidence of arrhythmia and/or significant heart rate changes.  Clinical Course as of 05/04/22 1536  Sat May 04, 2022  1447 Personally interpreted the patient's CT stone, and await radiologist final report but  my interpretation suspicious for nephrolithiasis with mild hydronephrosis particularly on the left. [MQ]    Clinical Course User Index [MQ] Delman Kitten, MD   ----------------------------------------- 3:35 PM on 05/04/2022 ----------------------------------------- Patient's pain is improved markedly from when initiated.  At this time I have spoken with our nephrologist Dr. Lanora Manis who has already seen the patient and his recommendation is that the patient be admitted for observation, hemodialysis, and urology consultation as the patient does have preserved some renal function and is not an uric at baseline.  Patient will be observed closely, pain control, and further consultation anticipated from nephrology and urology. Patient understandable being and agreeable with plan for admission.  Pain presently well controlled and awaiting urinalysis  Impression at this time is likely acute episode of nephrolithiasis, possibly with a stone not visualized on CT or recently passed.  Nevertheless his CT imaging and clinical history seem to be suggestive of an acute urologic etiology led to his presentation.  FINAL CLINICAL IMPRESSION(S) / ED DIAGNOSES   Final diagnoses:  Hydronephrosis of left kidney     Rx / DC Orders   ED Discharge Orders     None        Note:  This document was prepared using Dragon  voice recognition software and may include unintentional dictation errors.   Delman Kitten, MD 05/04/22 1536

## 2022-05-04 NOTE — ED Notes (Signed)
Pt provided with urinal to provide urine sample. Pt states unable to void at this time but will notify staff when he is able to.

## 2022-05-04 NOTE — Assessment & Plan Note (Signed)
-   Consulted Dr. Joylene Creed of renal for dialysis

## 2022-05-04 NOTE — Assessment & Plan Note (Addendum)
-   IV hydralazine as needed -Amlodipine, oral hydralazine,

## 2022-05-04 NOTE — Progress Notes (Signed)
Post HD RN assessment 

## 2022-05-05 ENCOUNTER — Encounter: Payer: Self-pay | Admitting: Internal Medicine

## 2022-05-05 DIAGNOSIS — N1330 Unspecified hydronephrosis: Secondary | ICD-10-CM | POA: Diagnosis not present

## 2022-05-05 LAB — CBC
HCT: 36.2 % — ABNORMAL LOW (ref 39.0–52.0)
Hemoglobin: 12 g/dL — ABNORMAL LOW (ref 13.0–17.0)
MCH: 28.9 pg (ref 26.0–34.0)
MCHC: 33.1 g/dL (ref 30.0–36.0)
MCV: 87.2 fL (ref 80.0–100.0)
Platelets: 213 10*3/uL (ref 150–400)
RBC: 4.15 MIL/uL — ABNORMAL LOW (ref 4.22–5.81)
RDW: 13.2 % (ref 11.5–15.5)
WBC: 10.4 10*3/uL (ref 4.0–10.5)
nRBC: 0 % (ref 0.0–0.2)

## 2022-05-05 LAB — BASIC METABOLIC PANEL
Anion gap: 7 (ref 5–15)
BUN: 31 mg/dL — ABNORMAL HIGH (ref 6–20)
CO2: 28 mmol/L (ref 22–32)
Calcium: 8.4 mg/dL — ABNORMAL LOW (ref 8.9–10.3)
Chloride: 103 mmol/L (ref 98–111)
Creatinine, Ser: 3.7 mg/dL — ABNORMAL HIGH (ref 0.61–1.24)
GFR, Estimated: 19 mL/min — ABNORMAL LOW (ref >60)
Glucose, Bld: 193 mg/dL — ABNORMAL HIGH (ref 70–99)
Potassium: 3.3 mmol/L — ABNORMAL LOW (ref 3.5–5.1)
Sodium: 138 mmol/L (ref 135–145)

## 2022-05-05 LAB — GLUCOSE, CAPILLARY: Glucose-Capillary: 180 mg/dL — ABNORMAL HIGH (ref 70–99)

## 2022-05-05 MED ORDER — OXYCODONE-ACETAMINOPHEN 5-325 MG PO TABS: 1.0000 | ORAL_TABLET | ORAL | 0 refills | Status: DC | PRN

## 2022-05-05 NOTE — TOC CM/SW Note (Signed)
Patient has orders to discharge home today. Chart reviewed. No TOC needs identified. CSW signing off.  Avon Mergenthaler, CSW [phone]  

## 2022-05-05 NOTE — Progress Notes (Signed)
Left unit in wheelchair pushed by [place], [place]. Left in stable condition.

## 2022-05-05 NOTE — Assessment & Plan Note (Signed)
Continue lyrica.  Follow.  

## 2022-05-05 NOTE — Discharge Summary (Signed)
Physician Discharge Summary  Gregory Crane FAO:[id number] DOB: 09-May-1971 DOA: 05/04/2022  PCP: [name], MD  Admit date: 05/04/2022 Discharge date: 05/05/2022  Admitted From: Home Disposition:  Home  Recommendations for Outpatient Follow-up:  Follow up with PCP in 1-2 weeks Follow up with urology as outpatient  Home Health:No  Equipment/Devices:None   Discharge Condition:Stable  CODE STATUS:FULL  Diet recommendation: Reg  Brief/Interim Summary:  51 y.o. male with medical history significant of ESRD-HD (TTS), hypertension, hyperlipidemia, diabetes mellitus, kidney stone, Bell's palsy, pseudotumor cerebri, peripheral neuropathy, BPH, hypothyroidism, stroke, who presents with left flank pain.   Patient states that he started having left flank pain in this morning, also has some left-sided abdominal pain.  The   is sharp, severe, constant, nonradiating.  Patient has nausea, no vomiting or diarrhea.  No fever or chills.  No hematuria.  Patient has urge to urinate.  No dysuria or burning on urination.  Denies chest pain, cough, shortness breath.  Patient had dialysis on Thursday.  Patient underwent successful dialysis with ultrafiltration and fluid removal on 9/9.  Urology reviewed case on 9/9 and indicated possible need for cystoscopy/ureteroscopy.  Case was reviewed on 9/10 by urology consultant Dr. Erlene Quan.  No plans for inpatient diagnostic or therapeutic intervention.  Discussed with patient.  Pain well controlled.  Patient appropriate for discharge at this time.  We will follow-up outpatient urology.    Discharge Diagnoses:  Principal Problem:   Hydronephrosis, left Active Problems:   ESRD (end stage renal disease) ([place])   Essential hypertension   History of CVA (cerebrovascular accident)   Hyperlipidemia   Hypothyroidism   Leukocytosis   Type II diabetes mellitus with renal manifestations (HCC)  Left-sided hydronephrosis Left flank pain Patient presented with severe  left flank pain.  CT with mild left hydronephrosis.  No evidence of stones.  Ureter with tortuous appearance.  Admitted for pain control.  Urology indicated no need for inpatient procedure.  Pain well controlled on day of discharge.  Outpatient follow-up urology.  Discharge Instructions  Discharge Instructions     Diet - low sodium heart healthy   Complete by: As directed    Increase activity slowly   Complete by: As directed    No wound care   Complete by: As directed       Allergies as of 05/05/2022   No Known Allergies      Medication List     STOP taking these medications    atorvastatin 80 MG tablet Commonly known as: LIPITOR   cyanocobalamin 1000 MCG/ML injection Commonly known as: VITAMIN B12       TAKE these medications    Accu-Chek FastClix Lancets Misc USE TO CHECK BLOOD SUGAR THREE TIMES DAILY AS DIRECTED   amLODipine 10 MG tablet Commonly known as: NORVASC Take 10 mg by mouth daily.   aspirin EC 81 MG tablet Commonly known as: Aspirin Low Dose TAKE 1 TABLET(81 MG) BY MOUTH DAILY   D[place] Oint Apply to affected area bid prn   glucose blood test strip Commonly known as: FREESTYLE LITE Check blood sugars twice a day (Dx. 250.02)   hydrALAZINE 50 MG tablet Commonly known as: APRESOLINE TAKE 1 TABLET(50 MG) BY MOUTH THREE TIMES DAILY What changed: See the new instructions.   insulin glargine 100 UNIT/ML injection Commonly known as: Lantus Inject 0.13 mLs (13 Units total) into the skin once daily. What changed: how much to take   insulin lispro 100 UNIT/ML injection Commonly known as: HUMALOG Inject 4-12  units three times daily before meals per sliding scale: If premeal sugar is <130, inject 4 units; if 131-180, inject 6 units; if 181-240, inject 8 units; if 241-300, inject 10 units, if  >300, inject 12 units (max daily dose 36 units)   INSULIN SYRINGE .5CC/30GX5/16" 30G X 5/16" 0.5 ML Misc USE AS DIRECTED WITH INSULIN   levothyroxine  175 MCG tablet Commonly known as: SYNTHROID TAKE 1 TABLET (175 MCG) BY MOUTH ONCE DAILY BEFORE BREAKFAST.   mupirocin ointment 2 % Commonly known as: BACTROBAN Apply 1 Application topically 2 (two) times daily.   oxyCODONE-acetaminophen 5-325 MG tablet Commonly known as: PERCOCET/ROXICET Take 1 tablet by mouth every 4 (four) hours as needed for moderate pain.   pregabalin 100 MG capsule Commonly known as: LYRICA TAKE 1 CAPSULE(100 MG) BY MOUTH THREE TIMES DAILY What changed: See the new instructions.   Renvela 800 MG tablet Generic drug: sevelamer carbonate Take 800 mg by mouth 3 (three) times daily.   SYRINGE 3CC/25GX1" 25G X 1" 3 ML Misc Use as directed to administer b12 injections.        No Known Allergies  Consultations: Urology   Procedures/Studies: CT Renal Stone Study  Result Date: 05/04/2022 CLINICAL DATA:  Abdominal pain, back pain EXAM: CT ABDOMEN AND PELVIS WITHOUT CONTRAST TECHNIQUE: Multidetector CT imaging of the abdomen and pelvis was performed following the standard protocol without IV contrast. RADIATION DOSE REDUCTION: This exam was performed according to the departmental dose-optimization program which includes automated exposure control, adjustment of the mA and/or kV according to patient size and/or use of iterative reconstruction technique. COMPARISON:  Previous CT pelvis done on 12/12/2021 and renal sonogram done on 06/30/2019 FINDINGS: Lower chest: Heart is enlarged in size. No focal infiltrates are seen in the visualized lower lung fields. There is minimal pericardial effusion. Coronary artery calcifications are seen. Hepatobiliary: Liver measures 19.3 cm in length. No focal abnormalities are seen. There is no dilation of bile ducts. Surgical clips are seen in gallbladder fossa. Pancreas: No focal abnormalities are seen. Spleen: Spleen measures 17.2 cm in maximum diameter. Adrenals/Urinary Tract: Adrenals are unremarkable. There is no hydronephrosis in  the right kidney. There is moderate left hydronephrosis. There is possible tiny 1 mm calculus in the lower pole of left kidney. Calcification is seen in renal artery branch in the upper pole of right kidney. There is stranding in perinephric fat planes on both sides. Left ureter is dilated to the ureterovesical junction. There are no demonstrable opaque ureteral calculi. Urinary bladder is unremarkable. Stomach/Bowel: Small hiatal hernia is seen. Small bowel loops are not dilated. Appendix is not dilated. There is no significant wall thickening in colon. Vascular/Lymphatic: Scattered arterial calcifications are seen. Reproductive: Unremarkable. Other: There is no ascites or pneumoperitoneum. Musculoskeletal: No acute findings are seen. IMPRESSION: There is moderate left hydronephrosis. There are no demonstrable opaque ureteral calculi. Findings may suggest recent passage of left ureteral calculus or stricture at the left ureterovesical junction or obstruction by non opaque calculus. There is possible 1 mm calculus in the lower pole of left kidney. There is no hydronephrosis in the right kidney. There is significant perinephric stranding around both kidneys which may be due to recent or previous ureteric obstructions and pyelonephritis. There is no evidence of intestinal obstruction or pneumoperitoneum. Appendix is not dilated. Enlarged liver and spleen. Minimal pericardial effusion. Small hiatal hernia. Coronary artery calcifications are seen. Other findings as described in the body of the report. Electronically Signed   By: Elmer Picker  M.D.   On: 05/04/2022 14:47      Subjective: Seen and examined on day of discharge.  Stable no distress.  Pain well controlled.   Stable for discharge home  Discharge Exam: Vitals:   05/05/22 0451 05/05/22 0734  BP: (!) 153/67 137/62  Pulse: 76 73  Resp: 16   Temp: 97.9 F (36.6 C) 98 F (36.7 C)  SpO2: 96% 96%   Vitals:   05/04/22 2043 05/04/22 2100  05/05/22 0451 05/05/22 0734  BP: (!) 158/69 (!) 158/69 (!) 153/67 137/62  Pulse: 83 83 76 73  Resp: '20 20 16   '$ Temp: 98.2 F (36.8 C) 98.2 F (36.8 C) 97.9 F (36.6 C) 98 F (36.7 C)  TempSrc: Oral Oral Oral Oral  SpO2: 98% 98% 96% 96%  Weight:  112 kg    Height:  6' (1.829 m)      General: Pt is alert, awake, not in acute distress Cardiovascular: RRR, S1/S2 +, no rubs, no gallops Respiratory: CTA bilaterally, no wheezing, no rhonchi Abdominal: Soft, NT, ND, bowel sounds + Extremities: no edema, no cyanosis    The results of significant diagnostics from this hospitalization (including imaging, microbiology, ancillary and laboratory) are listed below for reference.     Microbiology: No results found for this or any previous visit (from the past 240 hour(s)).   Labs: BNP (last 3 results) No results for input(s): "BNP" in the last 8760 hours. Basic Metabolic Panel: Recent Labs  Lab 05/04/22 1140 05/05/22 0507  NA 138  138 138  K 4.1  4.1 3.3*  CL 102  103 103  CO2 22  21* 28  GLUCOSE 251*  257* 193*  BUN 43*  42* 31*  CREATININE 4.96*  5.05* 3.70*  CALCIUM 9.1  9.2 8.4*  PHOS 5.3*  --    Liver Function Tests: Recent Labs  Lab 05/04/22 1140  AST 18  ALT 12  ALKPHOS 77  BILITOT 0.4  PROT 8.3*  ALBUMIN 4.2  4.2   Recent Labs  Lab 05/04/22 1140  LIPASE 43   No results for input(s): "AMMONIA" in the last 168 hours. CBC: Recent Labs  Lab 05/04/22 1140 05/04/22 2007 05/05/22 0507  WBC 11.8* 9.8 10.4  HGB 12.6* 14.5 12.0*  HCT 38.2* 43.5 36.2*  MCV 89.0 87.5 87.2  PLT 195 176 213   Cardiac Enzymes: No results for input(s): "CKTOTAL", "CKMB", "CKMBINDEX", "TROPONINI" in the last 168 hours. BNP: Invalid input(s): "POCBNP" CBG: Recent Labs  Lab 05/04/22 2120 05/05/22 0843  GLUCAP 138* 180*   D-Dimer No results for input(s): "DDIMER" in the last 72 hours. Hgb A1c No results for input(s): "HGBA1C" in the last 72 hours. Lipid  Profile No results for input(s): "CHOL", "HDL", "LDLCALC", "TRIG", "CHOLHDL", "LDLDIRECT" in the last 72 hours. Thyroid function studies No results for input(s): "TSH", "T4TOTAL", "T3FREE", "THYROIDAB" in the last 72 hours.  Invalid input(s): "FREET3" Anemia work up No results for input(s): "VITAMINB12", "FOLATE", "FERRITIN", "TIBC", "IRON", "RETICCTPCT" in the last 72 hours. Urinalysis    Component Value Date/Time   COLORURINE YELLOW (A) 05/04/2022 2047   APPEARANCEUR CLEAR (A) 05/04/2022 2047   APPEARANCEUR Clear 01/27/2014 0247   LABSPEC 1.012 05/04/2022 2047   LABSPEC 1.033 01/27/2014 0247   PHURINE 6.0 05/04/2022 2047   GLUCOSEU >=500 (A) 05/04/2022 2047   GLUCOSEU >=500 01/27/2014 0247   HGBUR SMALL (A) 05/04/2022 2047   BILIRUBINUR NEGATIVE 05/04/2022 2047   BILIRUBINUR Negative 01/27/2014 [place] 05/04/2022 2047  PROTEINUR 100 (A) 05/04/2022 2047   NITRITE NEGATIVE 05/04/2022 2047   LEUKOCYTESUR NEGATIVE 05/04/2022 2047   LEUKOCYTESUR Negative 01/27/2014 0247   Sepsis Labs Recent Labs  Lab 05/04/22 1140 05/04/22 2007 05/05/22 0507  WBC 11.8* 9.8 10.4   Microbiology No results found for this or any previous visit (from the past 240 hour(s)).   Time coordinating discharge: Over 30 minutes  SIGNED:   Sidney Ace, MD  [facility] 05/05/2022, 11:48 AM Pager   If 7PM-7AM, please contact night-coverage

## 2022-05-05 NOTE — Assessment & Plan Note (Signed)
Planning for eye surgery.  Discussed procedure.  Discussed his current functional status.  Increased fatigue with walking up a flight of stairs.  EKG - SR, appears to have q waves in III and avf.  Discussed my recommendation for cardiology evaluation prior to surgery - for cardiac clearance.

## 2022-05-05 NOTE — Assessment & Plan Note (Signed)
Sees nephrology.  On dialysis.  Tolerating.  

## 2022-05-05 NOTE — Assessment & Plan Note (Signed)
Check cbc prior to surgery.

## 2022-05-05 NOTE — Progress Notes (Signed)
Discharge instructions given. No concerns voiced. Pt awaiting ride for transport home. Encouraged to stop by pharmacy and pick up med that was e-prescribed. Voiced understanding .

## 2022-05-05 NOTE — Assessment & Plan Note (Signed)
Low cholesterol diet and exercise.  Follow lipid panel and liver function tests.  

## 2022-05-05 NOTE — Assessment & Plan Note (Signed)
On thyroid replacement.  Follow tsh.  

## 2022-05-05 NOTE — Assessment & Plan Note (Signed)
Blood pressure has been doing better.  Continue hydralazine and amlodipine.  Follow pressures.  Follow metabolic panel.  Will need close intra op and post op monitoring of heart rate and blood pressure to avoid extremes.

## 2022-05-05 NOTE — Assessment & Plan Note (Signed)
Low carb diet and exercise.  No change in medication.  Follow met b and a1c.

## 2022-05-05 NOTE — Assessment & Plan Note (Signed)
Continue aspirin and lipitor.  Continue blood pressure control.  Per note, does not need to stop aspirin prior to procedure.

## 2022-05-05 NOTE — Assessment & Plan Note (Signed)
Last carotid ultrasound - <50% bilaterally.  Have discussed need to take statin and keep blood pressure under control.  

## 2022-05-06 ENCOUNTER — Encounter: Payer: Self-pay | Admitting: Internal Medicine

## 2022-05-06 LAB — URINE CULTURE

## 2022-05-06 LAB — HIV ANTIBODY (ROUTINE TESTING W REFLEX): HIV Screen 4th Generation wRfx: NONREACTIVE

## 2022-05-06 NOTE — Telephone Encounter (Signed)
I am ok to repeat urine if he desires, but if having no symptoms we can monitor.

## 2022-05-06 NOTE — Telephone Encounter (Signed)
   Patient Name: Gregory Crane  DOB: 10-09-1970 MRN: [id number]  Primary Cardiologist: None  Chart reviewed as part of pre-operative protocol coverage. Cataract extractions are recognized in guidelines as low risk surgeries that do not typically require specific preoperative testing or holding of blood thinner therapy. Therefore, given past medical history and time since last visit, based on ACC/AHA guidelines, Mayur Duman would be at acceptable risk for the planned procedure without further cardiovascular testing.   I will route this recommendation to the requesting party via Epic fax function and remove from pre-op pool.  Please call with questions.  Elgie Collard, PA-C 05/06/2022, 5:07 PM

## 2022-05-07 ENCOUNTER — Other Ambulatory Visit: Payer: Self-pay

## 2022-05-07 DIAGNOSIS — R8290 Unspecified abnormal findings in urine: Secondary | ICD-10-CM

## 2022-05-07 LAB — HEPATITIS B SURFACE ANTIBODY, QUANTITATIVE: Hep B S AB Quant (Post): <3.1 m[IU]/mL — ABNORMAL LOW (ref >9.9)

## 2022-05-07 LAB — HEPATITIS C ANTIBODY: HCV Ab: NONREACTIVE — AB

## 2022-05-08 ENCOUNTER — Other Ambulatory Visit (INDEPENDENT_AMBULATORY_CARE_PROVIDER_SITE_OTHER): Payer: Medicaid Other

## 2022-05-08 DIAGNOSIS — R8290 Unspecified abnormal findings in urine: Secondary | ICD-10-CM | POA: Diagnosis not present

## 2022-05-08 LAB — URINALYSIS, ROUTINE W REFLEX MICROSCOPIC
Bilirubin Urine: NEGATIVE
Hgb urine dipstick: NEGATIVE
Ketones, ur: NEGATIVE
Leukocytes,Ua: NEGATIVE
Nitrite: NEGATIVE
Specific Gravity, Urine: 1.025 (ref 1.000–1.030)
Total Protein, Urine: >=300 — AB
Urine Glucose: 100 — AB
Urobilinogen, UA: 0.2 (ref 0.0–1.0)
pH: 6 (ref 5.0–8.0)

## 2022-05-09 ENCOUNTER — Telehealth: Payer: Self-pay | Admitting: Urology

## 2022-05-09 LAB — URINE CULTURE
MICRO NUMBER:: 13911982
SPECIMEN QUALITY:: ADEQUATE

## 2022-05-09 LAB — HEPATITIS B SURFACE ANTIBODY,QUALITATIVE

## 2022-05-09 LAB — HEPATITIS B CORE ANTIBODY, TOTAL

## 2022-05-09 NOTE — Telephone Encounter (Signed)
This is a patient Dr. Jeffie Pollock saw as an inpatient consult over the weekend.  He needs to be seen by Dr. Erlene Quan in clinic this week to arrange outpatient surgery.                     Follow-up ureteroscopy for this left flank pain patient   Left message for the patient to call the office to schedule appt with Dr. Erlene Quan.

## 2022-05-10 ENCOUNTER — Telehealth: Payer: Self-pay

## 2022-05-10 ENCOUNTER — Ambulatory Visit: Payer: Medicaid Other

## 2022-05-10 NOTE — Telephone Encounter (Signed)
Lvm for pt to return call in regards to urine results.   Per Dr.[name]: Please notify - urine culture negative for infection.  Confirm doing ok.

## 2022-05-13 LAB — HEPATITIS B SURFACE ANTIGEN

## 2022-05-13 NOTE — Telephone Encounter (Signed)
Left another message for patient to call the office to set up an appointment with Dr. Erlene Quan.

## 2022-05-21 ENCOUNTER — Other Ambulatory Visit: Payer: Self-pay | Admitting: Internal Medicine

## 2022-05-27 ENCOUNTER — Ambulatory Visit: Payer: Medicaid Other | Admitting: Nurse Practitioner

## 2022-05-28 ENCOUNTER — Telehealth: Payer: Self-pay

## 2022-05-28 DIAGNOSIS — T82898A Other specified complication of vascular prosthetic devices, implants and grafts, initial encounter: Secondary | ICD-10-CM | POA: Insufficient documentation

## 2022-05-28 NOTE — Telephone Encounter (Signed)
Noted. Hold until calls back.  Will need further orders and f/u tsh once dose is confirmed.

## 2022-05-28 NOTE — Telephone Encounter (Signed)
FYI I called patient after received your note on TSH being elevated. He stated that he was not at home & could confirm dosing. He said that he did not take regularly as it is a hard medication for him to get up & take. I asked that he try placing medication on night stand & setting an alarm so that he may get up to take then he could continue sleeping. Pt will call us when he is at home to confirm his dose.

## 2022-05-30 NOTE — Therapy (Signed)
OUTPATIENT PHYSICAL THERAPY NEURO EVALUATION   Patient Name: Gregory Crane MRN: [id number] DOB:[birth year], 51 y.o., male Today's Date: 06/03/2022   PCP: [name] MD REFERRING PROVIDER: [name] MD   PT End of Session - 06/03/22 1550     Visit Number 1    Number of Visits 24    Date for PT Re-Evaluation 08/26/22    Authorization Type 1/25 visits allowed: 1/10 eval 06/03/22    PT Start Time 6314    PT Stop Time 1445    PT Time Calculation (min) 47 min    Equipment Utilized During Treatment Gait belt    Activity Tolerance Patient tolerated treatment well    Behavior During Therapy WFL for tasks assessed/performed             Past Medical History:  Diagnosis Date   Allergy    Anemia    Bell's palsy    Diabetes mellitus without complication ([place])    diet controlled   Hypertension    Hypothyroidism    Kidney stones    Pseudotumor cerebri    Stroke [facility])    Past Surgical History:  Procedure Laterality Date   COLONOSCOPY WITH PROPOFOL N/A 03/30/2020   Procedure: COLONOSCOPY WITH PROPOFOL;  Surgeon: Lesly Rubenstein, MD;  Location: [facility] ENDOSCOPY;  Service: Endoscopy;  Laterality: N/A;   LOOP RECORDER INSERTION N/A 01/27/2018   Procedure: LOOP RECORDER INSERTION;  Surgeon: [name], MD;  Location: [facility];  Service: Cardiovascular;  Laterality: N/A;   LUMBAR PUNCTURE     as child   NO PAST SURGERIES     TEE WITHOUT CARDIOVERSION N/A 01/07/2018   Procedure: TRANSESOPHAGEAL ECHOCARDIOGRAM (TEE);  Surgeon: Minna Merritts, MD;  Location: [facility] ORS;  Service: Cardiovascular;  Laterality: N/A;   Patient Active Problem List   Diagnosis Date Noted   Hydronephrosis, left 05/04/2022   Type II diabetes mellitus with renal manifestations ([place]) 05/04/2022   Leukocytosis 05/04/2022   Pre-op evaluation 04/24/2022   Headache 07/07/2021   Hearing loss 07/07/2021   Open wound 01/25/2021   History of colon polyps 10/15/2020   Postoperative  hemorrhage involving digestive system following digestive system procedure 09/19/2020   Acute cholecystitis without calculus 09/09/2020   Type 2 diabetes mellitus, with long-term current use of insulin ([place]) 09/09/2020   Cryptogenic stroke ([place]) 07/13/2020   History of loop recorder 07/13/2020   Weakness 06/18/2020   History of 2019 novel coronavirus disease (COVID-19) 05/20/2020   ESRD (end stage renal disease) ([place]) 04/04/2020   Pneumonia due to COVID-19 virus 04/03/2020   AKI (acute kidney injury) ([place]) 04/03/2020   Elevated troponin 04/03/2020   Acquired trigger finger 06/08/2019   Lymphedema 06/08/2019   Anemia 04/17/2019   Swelling of left lower extremity 01/10/2019   Facial droop 04/09/2018   Daytime somnolence 03/30/2018   Carotid artery disease ([place]) 02/03/2018   Intracranial vascular stenosis 09/12/2017   Cough 01/20/2017   Bell's palsy 11/10/2016   History of CVA (cerebrovascular accident) 11/10/2016   Benign localized hyperplasia of prostate with urinary obstruction 10/27/2016   History of nephrolithiasis 10/27/2016   TIA (transient ischemic attack) 10/20/2016   Near syncope 06/23/2016   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   Neuropathy 08/06/2015   Health care maintenance 08/06/2015   Essential hypertension 08/06/2015   Heme positive stool 10/10/2013   Hypothyroidism 10/10/2013   Microalbuminuria 10/10/2013   Hyperlipidemia 10/10/2013   B12 deficiency 10/10/2013   Diabetes ([place]) 07/04/2013   Environmental allergies 07/04/2013  ONSET DATE: 2-3 years  REFERRING DIAG: Neuropathy  THERAPY DIAG:  Unsteadiness on feet  Difficulty in walking, not elsewhere classified  Muscle weakness (generalized)  Rationale for Evaluation and Treatment Rehabilitation  SUBJECTIVE:                                                                                                                                                                                              SUBJECTIVE  STATEMENT: Patient presents to physical therapy for evaluation of neuropathy and instability.  Pt accompanied by: self  PERTINENT HISTORY: Patient presents to physical therapy for neuropathy. He underwent banding of his L UE fistula on 05/13/22 and scheduled for additional banding on 06/07/22. 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 Patient reports his balance is very unsteady due to neuropathy in feet, feels weak in LE's.   PAIN:  Are you having pain? Yes: NPRS scale: 6-7/10 Pain location: bilateral feet Pain description: stinging tingling Aggravating factors: worse at night Relieving factors: lyrica   PRECAUTIONS: Fall  WEIGHT BEARING RESTRICTIONS Yes no lifting >5 lb in arm   FALLS: Has patient fallen in last 6 months? No  LIVING ENVIRONMENT: Lives with: lives alone Lives in: House/apartment Stairs: Yes: Internal: flight steps; on right going up Has following equipment at home: Single point cane and Walker - 2 wheeled  PLOF: Independent  PATIENT GOALS to be more steady and walk as normally as possible.   OBJECTIVE:   DIAGNOSTIC FINDINGS: n/a  COGNITION: Overall cognitive status: Within functional limits for tasks assessed   SENSATION: Light touch: Impaired Bilateral feet (sock pattern) impaired bilateral  COORDINATION: Heel slide: WFL     LOWER EXTREMITY MMT:    MMT Right Eval Left Eval  Hip flexion 4- 3+  Hip extension    Hip abduction 4- 4+  Hip adduction 4- 3+  Knee flexion 4 4-  Knee extension 4 4  Ankle dorsiflexion 2+ 2+  Ankle plantarflexion 3+ 3+  (Blank rows = not tested)   TRANSFERS: Assistive device utilized:  walking stick   Sit to stand: CGA Stand to sit: CGA   GAIT: Gait pattern: step through pattern, decreased stride length, shuffling, decreased  trunk rotation, poor foot clearance- Right, and poor foot clearance- Left Distance walked: 30 ft Assistive device utilized:  walking stick Level of assistance: Modified independence Comments: rolling of L foot   FUNCTIONAL TESTs:  5 times sit to stand: 19.6 seconds with walking stick; one LOB 10 meter walk test:  12 seconds Berg Balance Scale: 33/56  PATIENT SURVEYS:  ABC scale 67% FOTO 53%  TODAY'S TREATMENT:  Evaluation and HEP performance    PATIENT EDUCATION: Education details: HEP, goals, POC Person educated: Patient Education method: Explanation, Demonstration, Tactile cues, Verbal cues, and Handouts Education comprehension: verbalized understanding, returned demonstration, verbal cues required, and tactile cues required   HOME EXERCISE PROGRAM: Access Code: [email or web address] URL: [email or web address] Date: 06/03/2022 Prepared by: Janna Arch  Exercises - Seated Heel Toe Raises  - 1 x daily - 7 x weekly - 2 sets - 10 reps - 5 hold - Standing Tandem Balance with Counter Support  - 1 x daily - 7 x weekly - 2 sets - 2 reps - 30 hold - Standing March with Counter Support  - 1 x daily - 7 x weekly - 2 sets - 10 reps - 5 hold    GOALS: Goals reviewed with patient? Yes  SHORT TERM GOALS: Target date: 07/01/2022  Patient will be independent in home exercise program to improve strength/mobility for better functional independence with ADLs. Baseline:10/9; HEP given  Goal status: INITIAL    LONG TERM GOALS: Target date: 08/26/2022  Patient will increase FOTO score to equal to or greater than   63%  to demonstrate statistically significant improvement in mobility and quality of life.  Baseline: 10/9: 53%  Goal status: INITIAL  2.  Patient (< 12 years old) will complete five times sit to stand test in < 10 seconds without UE support indicating an increased LE strength and improved balance. Baseline: 10/9: 19.6 seconds with walking stick; one LOB Goal status:  INITIAL  3.  Patient will increase Berg Balance score by > 45/56 to demonstrate decreased fall risk during functional activities. Baseline: 10/9: 33/56 Goal status: INITIAL  4.  Patient will increase 10 meter walk test to >1.70ms as to improve gait speed for better community ambulation and to reduce fall risk. Baseline:  Goal status: INITIAL  5.  Patient will increase ABC scale score >80% to demonstrate better functional mobility and better confidence with ADLs.  Baseline: 10/9: 67%  Goal status: INITIAL    ASSESSMENT:  CLINICAL IMPRESSION: Patient is a 51y.o. male who was seen today for physical therapy evaluation and treatment for neuropathy. Patient has limited stability and mobility as can be seen in his instability with transfers during 5x STS and BERG scores. Lower extremity weakness is present with L>RLE. He has poor foot clearance bilaterally with majority of impairment focalized with foot clearance. Patient will benefit from skilled physical therapy to improve strength, mobility, and decrease risk for falls for improved quality of life.    OBJECTIVE IMPAIRMENTS Abnormal gait, decreased activity tolerance, decreased balance, decreased coordination, decreased endurance, decreased mobility, difficulty walking, decreased strength, impaired flexibility, impaired sensation, improper body mechanics, and pain.   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   REHAB POTENTIAL: Good  CLINICAL DECISION MAKING: Evolving/moderate complexity  EVALUATION COMPLEXITY: Moderate  PLAN: PT FREQUENCY: 2x/week  PT DURATION:  12 weeks  PLANNED INTERVENTIONS: Therapeutic exercises, Therapeutic activity, Neuromuscular re-education, Balance training, Gait training, Patient/Family education, Self Care, Joint mobilization, Stair training, Vestibular training, Canalith repositioning, Visual/preceptual remediation/compensation, DME instructions, Dry Needling, Cognitive remediation, Spinal mobilization, Cryotherapy, Moist heat, Manual lymph drainage, Compression bandaging, Taping, Vasopneumatic device, Ultrasound, Manual therapy, and Re-evaluation  PLAN FOR NEXT SESSION: balance and strength   Janna Arch, PT 06/03/2022, 3:52 PM

## 2022-06-03 ENCOUNTER — Ambulatory Visit: Payer: Medicare Other | Attending: Internal Medicine

## 2022-06-03 DIAGNOSIS — R2681 Unsteadiness on feet: Secondary | ICD-10-CM | POA: Insufficient documentation

## 2022-06-03 DIAGNOSIS — R262 Difficulty in walking, not elsewhere classified: Secondary | ICD-10-CM | POA: Insufficient documentation

## 2022-06-03 DIAGNOSIS — M6281 Muscle weakness (generalized): Secondary | ICD-10-CM | POA: Insufficient documentation

## 2022-06-03 DIAGNOSIS — R269 Unspecified abnormalities of gait and mobility: Secondary | ICD-10-CM | POA: Insufficient documentation

## 2022-06-03 DIAGNOSIS — R278 Other lack of coordination: Secondary | ICD-10-CM | POA: Diagnosis present

## 2022-06-03 DIAGNOSIS — R2689 Other abnormalities of gait and mobility: Secondary | ICD-10-CM | POA: Insufficient documentation

## 2022-06-03 DIAGNOSIS — N186 End stage renal disease: Secondary | ICD-10-CM | POA: Insufficient documentation

## 2022-06-03 DIAGNOSIS — G629 Polyneuropathy, unspecified: Secondary | ICD-10-CM | POA: Insufficient documentation

## 2022-06-05 ENCOUNTER — Telehealth: Payer: Self-pay | Admitting: Cardiology

## 2022-06-05 ENCOUNTER — Ambulatory Visit: Payer: Medicare Other | Attending: Cardiology

## 2022-06-05 DIAGNOSIS — I120 Hypertensive chronic kidney disease with stage 5 chronic kidney disease or end stage renal disease: Secondary | ICD-10-CM | POA: Insufficient documentation

## 2022-06-05 DIAGNOSIS — E785 Hyperlipidemia, unspecified: Secondary | ICD-10-CM | POA: Insufficient documentation

## 2022-06-05 DIAGNOSIS — R9431 Abnormal electrocardiogram [ECG] [EKG]: Secondary | ICD-10-CM | POA: Diagnosis present

## 2022-06-05 DIAGNOSIS — N186 End stage renal disease: Secondary | ICD-10-CM | POA: Diagnosis not present

## 2022-06-05 DIAGNOSIS — E1122 Type 2 diabetes mellitus with diabetic chronic kidney disease: Secondary | ICD-10-CM | POA: Insufficient documentation

## 2022-06-05 LAB — ECHOCARDIOGRAM COMPLETE
AR max vel: 4.3 cm2
AV Area VTI: 3.92 cm2
AV Area mean vel: 3.88 cm2
AV Mean grad: 4 mmHg
AV Peak grad: 6.5 mmHg
Ao pk vel: 1.27 m/s
Area-P 1/2: 2.91 cm2
S' Lateral: 2.9 cm

## 2022-06-05 NOTE — Telephone Encounter (Signed)
No significant abnormalities on echo.  Aorta mildly dilated, would recommend repeat echo in 1 year to follow.  No further work-up recommended prior to surgery.

## 2022-06-05 NOTE — Telephone Encounter (Signed)
Gregory Crane from [facility] preop clinic calling stating that pt is having surgery on Friday 06/07/22 and he had an echo done today and wanted to know if the results could be read by tomorrow. She states he doesn't need medical clearance and informed that the results weren't in yet, but will forward to MD to ask.

## 2022-06-07 ENCOUNTER — Other Ambulatory Visit: Payer: Self-pay | Admitting: *Deleted

## 2022-06-07 DIAGNOSIS — I77819 Aortic ectasia, unspecified site: Secondary | ICD-10-CM

## 2022-06-07 NOTE — Telephone Encounter (Signed)
See result note.  

## 2022-06-10 ENCOUNTER — Encounter: Payer: Medicaid Other | Admitting: Speech Pathology

## 2022-06-12 ENCOUNTER — Ambulatory Visit: Payer: Medicare Other

## 2022-06-12 ENCOUNTER — Ambulatory Visit (INDEPENDENT_AMBULATORY_CARE_PROVIDER_SITE_OTHER): Payer: Medicare Other | Admitting: Nurse Practitioner

## 2022-06-12 ENCOUNTER — Ambulatory Visit: Payer: Medicare Other | Admitting: Nurse Practitioner

## 2022-06-12 ENCOUNTER — Encounter: Payer: Self-pay | Admitting: Nurse Practitioner

## 2022-06-12 VITALS — BP 145/72 | HR 71 | Ht 72.0 in | Wt 245.5 lb

## 2022-06-12 DIAGNOSIS — N186 End stage renal disease: Secondary | ICD-10-CM | POA: Diagnosis not present

## 2022-06-12 DIAGNOSIS — R262 Difficulty in walking, not elsewhere classified: Secondary | ICD-10-CM

## 2022-06-12 DIAGNOSIS — R2681 Unsteadiness on feet: Secondary | ICD-10-CM | POA: Diagnosis not present

## 2022-06-12 DIAGNOSIS — E039 Hypothyroidism, unspecified: Secondary | ICD-10-CM | POA: Diagnosis not present

## 2022-06-12 DIAGNOSIS — E1122 Type 2 diabetes mellitus with diabetic chronic kidney disease: Secondary | ICD-10-CM | POA: Diagnosis not present

## 2022-06-12 DIAGNOSIS — Z794 Long term (current) use of insulin: Secondary | ICD-10-CM

## 2022-06-12 DIAGNOSIS — Z992 Dependence on renal dialysis: Secondary | ICD-10-CM

## 2022-06-12 DIAGNOSIS — R2689 Other abnormalities of gait and mobility: Secondary | ICD-10-CM

## 2022-06-12 DIAGNOSIS — M6281 Muscle weakness (generalized): Secondary | ICD-10-CM

## 2022-06-12 NOTE — Patient Instructions (Signed)

## 2022-06-12 NOTE — Progress Notes (Signed)
Endocrinology Consult Note       06/12/2022, 10:41 AM   Subjective:    Patient ID: Gregory Crane, male    DOB: Aug 19, 1971.  Gregory Crane is being seen in consultation for management of currently uncontrolled symptomatic diabetes requested by  [name], MD.   Past Medical History:  Diagnosis Date   Allergy    Anemia    Bell's palsy    Diabetes mellitus without complication ([place])    diet controlled   Hypertension    Hypothyroidism    Kidney stones    Pseudotumor cerebri    Stroke [facility])     Past Surgical History:  Procedure Laterality Date   COLONOSCOPY WITH PROPOFOL N/A 03/30/2020   Procedure: COLONOSCOPY WITH PROPOFOL;  Surgeon: Lesly Rubenstein, MD;  Location: [facility] END[facility];  Service: Endoscopy;  Laterality: N/A;   LOOP RECORDER INSERTION N/A 01/27/2018   Procedure: LOOP RECORDER INSERTION;  Surgeon: [name], MD;  Location: [facility];  Service: Cardiovascular;  Laterality: N/A;   LUMBAR PUNCTURE     as child   NO PAST SURGERIES     TEE WITHOUT CARDIOVERSION N/A 01/07/2018   Procedure: TRANSESOPHAGEAL ECHOCARDIOGRAM (TEE);  Surgeon: Minna Merritts, MD;  Location: [facility] ORS;  Service: Cardiovascular;  Laterality: N/A;    Social History   Socioeconomic History   Marital status: Widowed    Spouse name: [name]   Number of children: 2   Years of education: Not on file   Highest education level: Not on file  Occupational History   Not on file  Tobacco Use   Smoking status: Never   Smokeless tobacco: Never  Vaping Use   Vaping Use: Never used  Substance and Sexual Activity   Alcohol use: No    Alcohol/week: 0.0 standard drinks of alcohol    Comment: very rarely (1-2/year)   Drug use: No   Sexual activity: Yes  Other Topics Concern   Not on file  Social History Narrative   Lives at home with wife, independent at baseline.   Social Determinants of Health    Financial Resource Strain: High Risk (06/26/2021)   Overall Financial Resource Strain (CARDIA)    Difficulty of Paying Living Expenses: Hard  Food Insecurity: No Food Insecurity (05/05/2022)   Hunger Vital Sign    Worried About Running Out of Food in the Last Year: Never true    Ran Out of Food in the Last Year: Never true  Transportation Needs: No Transportation Needs (05/05/2022)   PRAPARE - Hydrologist (Medical): No    Lack of Transportation (Non-Medical): No  Physical Activity: Inactive (09/13/2017)   Exercise Vital Sign    Days of Exercise per Week: 0 days    Minutes of Exercise per Session: 0 min  Stress: Stress Concern Present (09/13/2017)   [place]    Feeling of Stress : To some extent  Social Connections: Moderately Integrated (09/13/2017)   Social Connection and Isolation Panel [NHANES]    Frequency of Communication with Friends and Family: More than three times a week    Frequency of Social Gatherings  with Friends and Family: More than three times a week    Attends Religious Services: More than 4 times per year    Active Member of Clubs or Organizations: No    Attends Archivist Meetings: Never    Marital Status: Married    Family History  Problem Relation Age of Onset   Breast cancer Mother    Diabetes Father    Diabetes Sister    Arthritis Maternal Grandmother    Diabetes Maternal Grandmother     Outpatient Encounter Medications as of 06/12/2022  Medication Sig   Accu-Chek FastClix Lancets MISC USE TO CHECK BLOOD SUGAR THREE TIMES DAILY AS DIRECTED   amLODipine (NORVASC) 10 MG tablet Take 10 mg by mouth daily.   aspirin (ASPIRIN LOW DOSE) 81 MG EC tablet TAKE 1 TABLET(81 MG) BY MOUTH DAILY   glucose blood (FREESTYLE LITE) test strip Check blood sugars twice a day (Dx. 250.02)   hydrALAZINE (APRESOLINE) 50 MG tablet TAKE 1 TABLET(50 MG) BY MOUTH THREE TIMES  DAILY   Infant Care Products (DERMACLOUD) OINT Apply to affected area bid prn   insulin glargine (LANTUS) 100 UNIT/ML injection Inject 0.13 mLs (13 Units total) into the skin once daily. (Patient taking differently: Inject 10-15 Units into the skin daily.)   insulin lispro (HUMALOG) 100 UNIT/ML injection Inject 4-12 units three times daily before meals per sliding scale: If premeal sugar is <130, inject 4 units; if 131-180, inject 6 units; if 181-240, inject 8 units; if 241-300, inject 10 units, if  >300, inject 12 units (max daily dose 36 units)   Insulin Syringe-Needle U-100 (INSULIN SYRINGE .5CC/30GX5/16") 30G X 5/16" 0.5 ML MISC USE AS DIRECTED WITH INSULIN   levothyroxine (SYNTHROID) 175 MCG tablet TAKE 1 TABLET (175 MCG) BY MOUTH ONCE DAILY BEFORE BREAKFAST.   mupirocin ointment (BACTROBAN) 2 % Apply 1 Application topically 2 (two) times daily.   oxyCODONE-acetaminophen (PERCOCET/ROXICET) 5-325 MG tablet Take 1 tablet by mouth every 4 (four) hours as needed for moderate pain.   pregabalin (LYRICA) 100 MG capsule TAKE 1 CAPSULE(100 MG) BY MOUTH THREE TIMES DAILY (Patient taking differently: daily.)   RENVELA 800 MG tablet Take 800 mg by mouth 3 (three) times daily.   Syringe/Needle, Disp, (SYRINGE 3CC/25GX1") 25G X 1" 3 ML MISC Use as directed to administer b12 injections.   No facility-administered encounter medications on file as of 06/12/2022.    ALLERGIES: No Known Allergies  VACCINATION STATUS: Immunization History  Administered Date(s) Administered   Influenza Split 05/10/2014   Influenza,inj,Quad PF,6+ Mos 06/21/2019, 05/16/2020, 06/26/2021   Influenza-Unspecified 07/02/2015, 05/30/2017, 05/26/2018    Diabetes He presents for his initial diabetic visit. He has type 2 (says recently had labs at [facility] showing evidence of type1?) diabetes mellitus. Onset time: diagnosed at approx age of 51. His disease course has been improving. Associated symptoms include blurred vision (sees  retinal specialist). There are no hypoglycemic complications. Symptoms are stable. Diabetic complications include a CVA, heart disease, nephropathy and retinopathy. Risk factors for coronary artery disease include male sex, obesity, hypertension, sedentary lifestyle, diabetes mellitus and dyslipidemia. Current diabetic treatment includes intensive insulin program. He is compliant with treatment most of the time. His weight is stable. He is following a generally unhealthy diet. When asked about meal planning, he reported none. He has not had a previous visit with a dietitian. He rarely participates in exercise. His breakfast blood glucose range is generally 140-180 mg/dl. (He presents today for his consultation, accompanied by family member, with  no meter or logs to review.  His most recent A1c on 10/16 was 6.5%.  He will be having surgery on left arm soon.  He has ESRD on HD.  He monitors glucose 2-3 times daily (fasting readings between 130-170).  He does not have any routine eating schedule, finds it difficult to stick with one due to his HD schedule.  He drinks water and some diet Mt Dew daily.  He does not exercise optimally due to mobility deficits.  He is UTD on eye exam (sees retinal specialist) and sees podiatry routinely for DM foot care.)     Review of systems  Constitutional: + Minimally fluctuating body weight, current Body mass index is 33.3 kg/m., no fatigue, no subjective hyperthermia, no subjective hypothermia Eyes: + blurry vision-sees retina specialist, no xerophthalmia ENT: no sore throat, no nodules palpated in throat, no dysphagia/odynophagia, no hoarseness Cardiovascular: no chest pain, no shortness of breath, no palpitations, no leg swelling Respiratory: no cough, no shortness of breath Gastrointestinal: no nausea/vomiting/diarrhea Musculoskeletal: no muscle/joint aches, walks with cane Skin: no rashes, no hyperemia Neurological: no tremors, no numbness, no tingling, no  dizziness Psychiatric: no depression, no anxiety  Objective:     BP (!) 145/72 (BP Location: Right Arm, Patient Position: Sitting, Cuff Size: Large)   Pulse 71   Ht 6' (1.829 m)   Wt 245 lb 8 oz (111.4 kg)   BMI 33.30 kg/m   Wt Readings from Last 3 Encounters:  06/12/22 245 lb 8 oz (111.4 kg)  05/04/22 246 lb 14.6 oz (112 kg)  05/01/22 247 lb (112 kg)     BP Readings from Last 3 Encounters:  06/12/22 (!) 145/72  05/05/22 137/62  05/01/22 124/66     Physical Exam- Limited  Constitutional:  Body mass index is 33.3 kg/m. , not in acute distress, normal state of mind Eyes:  EOMI, no exophthalmos Neck: Supple Cardiovascular: RRR, no murmurs, rubs, or gallops, no edema Respiratory: Adequate breathing efforts, no crackles, rales, rhonchi, or wheezing Musculoskeletal: no gross deformities, strength intact in all four extremities, no gross restriction of joint movements Skin:  no rashes, no hyperemia Neurological: no tremor with outstretched hands   Diabetic Foot Exam - Simple   No data filed       CMP ( most recent) CMP     Component Value Date/Time   NA 138 05/05/2022 0507   NA 144 04/24/2020 1513   NA 136 01/26/2014 1928   K 3.3 (L) 05/05/2022 0507   K 4.0 01/26/2014 1928   CL 103 05/05/2022 0507   CL 102 01/26/2014 1928   CO2 28 05/05/2022 0507   CO2 27 01/26/2014 1928   GLUCOSE 193 (H) 05/05/2022 0507   GLUCOSE 217 (H) 01/26/2014 1928   BUN 31 (H) 05/05/2022 0507   BUN 35 (H) 04/24/2020 1513   BUN 9 01/26/2014 1928   CREATININE 3.70 (H) 05/05/2022 0507   CREATININE 0.76 01/26/2014 1928   CALCIUM 8.4 (L) 05/05/2022 0507   CALCIUM 9.0 01/26/2014 1928   PROT 8.3 (H) 05/04/2022 1140   PROT 5.8 (L) 02/22/2020 1154   ALBUMIN 4.2 05/04/2022 1140   ALBUMIN 4.2 05/04/2022 1140   ALBUMIN 3.3 (L) 02/22/2020 1154   AST 18 05/04/2022 1140   ALT 12 05/04/2022 1140   ALKPHOS 77 05/04/2022 1140   BILITOT 0.4 05/04/2022 1140   BILITOT 0.2 02/22/2020 1154    GFRNONAA 19 (L) 05/05/2022 0507   GFRNONAA >60 01/26/2014 1928   GFRAA 35 (L)  04/24/2020 1513   GFRAA >60 01/26/2014 1928     Diabetic Labs (most recent): Lab Results  Component Value Date   HGBA1C 6.9 (A) 04/24/2022   HGBA1C 8.8 (H) 06/26/2021   HGBA1C 9.9 (H) 01/08/2021     Lipid Panel ( most recent) Lipid Panel     Component Value Date/Time   CHOL 219 (H) 06/26/2021 1331   CHOL 109 03/28/2020 1202   TRIG 332.0 (H) 06/26/2021 1331   HDL 31.30 (L) 06/26/2021 1331   HDL 31 (L) 03/28/2020 1202   CHOLHDL 7 06/26/2021 1331   VLDL 66.4 (H) 06/26/2021 1331   LDLCALC 66 01/08/2021 1158   LDLCALC 60 03/28/2020 1202   LDLDIRECT 107.0 06/26/2021 1331   LABVLDL 18 03/28/2020 1202      Lab Results  Component Value Date   TSH 13.61 (H) 06/26/2021   TSH 22.70 (H) 01/08/2021   TSH 11.90 (H) 08/15/2020   TSH 15.38 (H) 05/16/2020   TSH 10.400 (H) 02/22/2020   TSH 13.300 (H) 06/08/2019   TSH 13.300 (H) 05/26/2019   TSH 7.970 (H) 03/25/2017   TSH 7.530 (H) 10/20/2016   TSH 7.850 (H) 06/27/2016           Assessment & Plan:   1) Type 2 diabetes mellitus with chronic kidney disease on chronic dialysis, with long-term current use of insulin ([place])  He presents today for his consultation, accompanied by family member, with no meter or logs to review.  His most recent A1c on 10/16 was 6.5%.  He will be having surgery on left arm soon.  He has ESRD on HD.  He monitors glucose 2-3 times daily (fasting readings between 130-170).  He does not have any routine eating schedule, finds it difficult to stick with one due to his HD schedule.  He drinks water and some diet Mt Dew daily.  He does not exercise optimally due to mobility deficits.  He is UTD on eye exam (sees retinal specialist) and sees podiatry routinely for DM foot care.  - Gregory Crane has currently uncontrolled symptomatic type 2 DM since 51 years of age, with most recent A1c of 6.5 %.   -Recent labs reviewed.  - I had a  long discussion with him about the progressive nature of diabetes and the pathology behind its complications. -his diabetes is complicated by ESRD on HD, CAD, retinopathy, and CVA and he remains at a high risk for more acute and chronic complications which include CAD, CVA, CKD, retinopathy, and neuropathy. These are all discussed in detail with him.  The following Lifestyle Medicine recommendations according to [facility]) were discussed and offered to patient and he agrees to start the journey:  A. Whole Foods, Plant-based plate comprising of fruits and vegetables, plant-based proteins, whole-grain carbohydrates was discussed in detail with the patient.   A list for source of those nutrients were also provided to the patient.  Patient will use only water or unsweetened tea for hydration. B.  The need to stay away from risky substances including alcohol, smoking; obtaining 7 to 9 hours of restorative sleep, at least 150 minutes of moderate intensity exercise weekly, the importance of healthy social connections,  and stress reduction techniques were discussed. C.  A full color page of  Calorie density of various food groups per pound showing examples of each food groups was provided to the patient.  - I have counseled him on diet and weight management by adopting a carbohydrate restricted/protein  rich diet. Patient is encouraged to switch to unprocessed or minimally processed complex starch and increased protein intake (animal or plant source), fruits, and vegetables. -  he is advised to stick to a routine mealtimes to eat 3 meals a day and avoid unnecessary snacks (to snack only to correct hypoglycemia).   - he acknowledges that there is a room for improvement in his food and drink choices. - Suggestion is made for him to avoid simple carbohydrates from his diet including Cakes, Sweet Desserts, Ice Cream, Soda (diet and regular), Sweet Tea, Candies, Chips, Cookies,  Store Bought Juices, Alcohol in Excess of 1-2 drinks a day, Artificial Sweeteners, Coffee Creamer, and "Sugar-free" Products. This will help patient to have more stable blood glucose profile and potentially avoid unintended weight gain.  - I have approached him with the following individualized plan to manage his diabetes and patient agrees:   -He is advised to continue his Lantus 15 units SQ nightly and Humalog 4-10 units TID with meals if glucose is above 90 and he is eating (Specific instructions on how to titrate insulin dosage based on glucose readings given to patient in writing).  He demonstrated his ability to properly dose his SSI based on chart with me today.  -he is encouraged to start monitoring glucose 4 times daily, before meals and before bed, to log their readings on the clinic sheets provided, and bring them to review at follow up appointment in 4 weeks.  He tried the [place] 2 before, did not like it.  I did send in order for Dexcom G7 to [facility] and gave him a sample to try.  - he is warned not to take insulin without proper monitoring per orders. - Adjustment parameters are given to him for hypo and hyperglycemia in writing. - he is encouraged to call clinic for blood glucose levels less than 70 or above 300 mg /dl.  -I will check antibodies to help classify his diabetes as I did not see any evidence of this from [facility].  - Specific targets for  A1c; LDL, HDL, and Triglycerides were discussed with the patient.  2) Blood Pressure /Hypertension:  his blood pressure is not controlled to target.   he is advised to continue his current medications including Norvasc 10 mg p.o. daily with breakfast and Hydralazine 50 mg po TID.  3) Lipids/Hyperlipidemia:    Review of his recent lipid panel from 06/10/22 showed controlled LDL at 64 and elevated triglycerides of 179.  He is not currently on any lipid lowering agents.  4)  Weight/Diet:  his Body mass index is 33.3 kg/m.  -  clearly  complicating his diabetes care.   he is a candidate for weight loss. I discussed with him the fact that loss of 5 - 10% of his  current body weight will have the most impact on his diabetes management.  Exercise, and detailed carbohydrates information provided  -  detailed on discharge instructions.  5) Hypothyroidism The details surrounding his diagnosis are not available.  He is currently on Levothyroxine 175 mcg po daily.  His most recent thyroid function tests done at [facility] (in [place]) are consistent with under replacement.  Will discuss further with him at follow up visit.  Will recheck TFTs on subsequent visits.  6) Chronic Care/Health Maintenance: -he is not on ACEI/ARB or Statin medications and is encouraged to initiate and continue to follow up with Ophthalmology, Dentist, Podiatrist at least yearly or according to recommendations, and advised to stay  away from smoking. I have recommended yearly flu vaccine and pneumonia vaccine at least every 5 years; moderate intensity exercise for up to 150 minutes weekly; and sleep for at least 7 hours a day.  - he is advised to maintain close follow up with [name], MD for primary care needs, as well as his other providers for optimal and coordinated care.   - Time spent in this patient care: 60 min, of which > 50% was spent in counseling him about his diabetes and the rest reviewing his blood glucose logs, discussing his hypoglycemia and hyperglycemia episodes, reviewing his current and previous labs/studies (including abstraction from other facilities) and medications doses and developing a long term treatment plan based on the latest standards of care/guidelines; and documenting his care.    Please refer to Patient Instructions for Blood Glucose Monitoring and Insulin/Medications Dosing Guide" in media tab for additional information. Please also refer to "Patient Self Inventory" in the Media tab for reviewed elements of pertinent patient  history.  Vernia Buff Sliger participated in the discussions, expressed understanding, and voiced agreement with the above plans.  All questions were answered to his satisfaction. he is encouraged to contact clinic should he have any questions or concerns prior to his return visit.     Follow up plan: - Return in about 1 month (around 07/13/2022) for Diabetes F/U, Bring meter and logs, Previsit labs.    Rayetta Pigg, [facility] [address] [place], [place] [address] Phone: [phone] Fax: [phone]  06/12/2022, 10:41 AM

## 2022-06-12 NOTE — Therapy (Signed)
OUTPATIENT PHYSICAL THERAPY NEURO TREATMENT   Patient Name: Gregory Crane MRN: [id number] DOB:October 18, 1970, 51 y.o., male Today's Date: 06/12/2022   PCP: [name] MD REFERRING PROVIDER: [name] MD   PT End of Session - 06/12/22 1512     Visit Number 2    Number of Visits 24    Date for PT Re-Evaluation 08/26/22    Authorization Type 2/25 visits allowed: 2/10 eval 06/03/22    PT Start Time 4008    PT Stop Time 1559    PT Time Calculation (min) 44 min    Equipment Utilized During Treatment Gait belt    Activity Tolerance Patient tolerated treatment well    Behavior During Therapy WFL for tasks assessed/performed              Past Medical History:  Diagnosis Date   Allergy    Anemia    Bell's palsy    Diabetes mellitus without complication ([place])    diet controlled   Hypertension    Hypothyroidism    Kidney stones    Pseudotumor cerebri    Stroke [facility])    Past Surgical History:  Procedure Laterality Date   COLONOSCOPY WITH PROPOFOL N/A 03/30/2020   Procedure: COLONOSCOPY WITH PROPOFOL;  Surgeon: Lesly Rubenstein, MD;  Location: [facility] ENDOSCOPY;  Service: Endoscopy;  Laterality: N/A;   LOOP RECORDER INSERTION N/A 01/27/2018   Procedure: LOOP RECORDER INSERTION;  Surgeon: [name], MD;  Location: [facility];  Service: Cardiovascular;  Laterality: N/A;   LUMBAR PUNCTURE     as child   NO PAST SURGERIES     TEE WITHOUT CARDIOVERSION N/A 01/07/2018   Procedure: TRANSESOPHAGEAL ECHOCARDIOGRAM (TEE);  Surgeon: Minna Merritts, MD;  Location: [facility] ORS;  Service: Cardiovascular;  Laterality: N/A;   Patient Active Problem List   Diagnosis Date Noted   Hydronephrosis, left 05/04/2022   Type II diabetes mellitus with renal manifestations ([place]) 05/04/2022   Leukocytosis 05/04/2022   Pre-op evaluation 04/24/2022   Headache 07/07/2021   Hearing loss 07/07/2021   Open wound 01/25/2021   History of colon polyps 10/15/2020   Postoperative  hemorrhage involving digestive system following digestive system procedure 09/19/2020   Acute cholecystitis without calculus 09/09/2020   Type 2 diabetes mellitus, with long-term current use of insulin ([place]) 09/09/2020   Cryptogenic stroke ([place]) 07/13/2020   History of loop recorder 07/13/2020   Weakness 06/18/2020   History of 2019 novel coronavirus disease (COVID-19) 05/20/2020   ESRD (end stage renal disease) ([place]) 04/04/2020   Pneumonia due to COVID-19 virus 04/03/2020   AKI (acute kidney injury) ([place]) 04/03/2020   Elevated troponin 04/03/2020   Acquired trigger finger 06/08/2019   Lymphedema 06/08/2019   Anemia 04/17/2019   Swelling of left lower extremity 01/10/2019   Facial droop 04/09/2018   Daytime somnolence 03/30/2018   Carotid artery disease ([place]) 02/03/2018   Intracranial vascular stenosis 09/12/2017   Cough 01/20/2017   Bell's palsy 11/10/2016   History of CVA (cerebrovascular accident) 11/10/2016   Benign localized hyperplasia of prostate with urinary obstruction 10/27/2016   History of nephrolithiasis 10/27/2016   TIA (transient ischemic attack) 10/20/2016   Near syncope 06/23/2016   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   Neuropathy 08/06/2015   Health care maintenance 08/06/2015   Essential hypertension 08/06/2015   Heme positive stool 10/10/2013   Hypothyroidism 10/10/2013   Microalbuminuria 10/10/2013   Hyperlipidemia 10/10/2013   B12 deficiency 10/10/2013   Diabetes ([place]) 07/04/2013   Environmental allergies 07/04/2013  ONSET DATE: 2-3 years  REFERRING DIAG: Neuropathy  THERAPY DIAG:  Unsteadiness on feet  Difficulty in walking, not elsewhere classified  Other abnormalities of gait and mobility  Muscle weakness (generalized)  Rationale for Evaluation and Treatment Rehabilitation  SUBJECTIVE:                                                                                                                                                                                               SUBJECTIVE STATEMENT: Patient had his procedure last week. Has insicion still healing in his L thigh. No precautions besides being gentle not to pull on it too hard.   Pt accompanied by: self  PERTINENT HISTORY: Patient presents to physical therapy for neuropathy. He underwent banding of his L UE fistula on 05/13/22 and scheduled for additional banding on 06/07/22. 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 Patient reports his balance is very unsteady due to neuropathy in feet, feels weak in LE's.   PAIN:  Are you having pain? Yes: NPRS scale: 6-7/10 Pain location: bilateral feet Pain description: stinging tingling Aggravating factors: worse at night Relieving factors: lyrica   PRECAUTIONS: Fall  WEIGHT BEARING RESTRICTIONS Yes no lifting >5 lb in arm   FALLS: Has patient fallen in last 6 months? No  LIVING ENVIRONMENT: Lives with: lives alone Lives in: House/apartment Stairs: Yes: Internal: flight steps; on right going up Has following equipment at home: Single point cane and Walker - 2 wheeled  PLOF: Independent  PATIENT GOALS to be more steady and walk as normally as possible.   OBJECTIVE:       LOWER EXTREMITY MMT:    MMT Right Eval Left Eval  Hip flexion 4- 3+  Hip extension    Hip abduction 4- 4+  Hip adduction 4- 3+  Knee flexion 4 4-  Knee extension 4 4  Ankle dorsiflexion 2+ 2+  Ankle plantarflexion 3+ 3+  (Blank rows = not tested)     FUNCTIONAL TESTs:  5 times sit to stand: 19.6 seconds with walking stick; one LOB 10 meter walk test: 12 seconds Berg Balance Scale: 33/56  PATIENT SURVEYS:  ABC scale 67% FOTO 53%  TODAY'S TREATMENT:  Neuro Re-ed Standing with CGA next to support surface:  Airex pad: static stand 30 seconds x 2  trials, noticeable trembling of ankles/LE's with fatigue and challenge to maintain stability Airex pad: horizontal head turns 30 seconds scanning room 10x ; cueing for arc of motion  Airex pad: vertical  head turns 30 seconds, cueing for arc of motion, noticeable sway with upward gaze increasing demand on ankle righting reaction musculature Airex pad: one foot on 6" step one foot on airex pad, hold position for 30 seconds, switch legs, 2x each LE;  Airex pad: dual task with cognitive word game ; challenging with visual deficits.   Ambulate scanning lateral aspect of gym for cones; reach and grab cone pass to PT x 6 minutes  TherEx TrA activation pressing into swiss ball 12x 3 second holds Dynadisc isometric hamstring 10x each LE  Dynadisc: df/pf 10x each LE  Glute squeeze 10x 3 second hold  PATIENT EDUCATION: Education details: HEP, goals, POC Person educated: Patient Education method: Explanation, Demonstration, Tactile cues, Verbal cues, and Handouts Education comprehension: verbalized understanding, returned demonstration, verbal cues required, and tactile cues required   HOME EXERCISE PROGRAM: Access Code: [email or web address] URL: [email or web address] Date: 06/03/2022 Prepared by: Janna Arch  Exercises - Seated Heel Toe Raises  - 1 x daily - 7 x weekly - 2 sets - 10 reps - 5 hold - Standing Tandem Balance with Counter Support  - 1 x daily - 7 x weekly - 2 sets - 2 reps - 30 hold - Standing March with Counter Support  - 1 x daily - 7 x weekly - 2 sets - 10 reps - 5 hold    GOALS: Goals reviewed with patient? Yes  SHORT TERM GOALS: Target date: 07/01/2022  Patient will be independent in home exercise program to improve strength/mobility for better functional independence with ADLs. Baseline:10/9; HEP given  Goal status: INITIAL    LONG TERM GOALS: Target date: 08/26/2022  Patient will increase FOTO score to equal to or greater than   63%  to demonstrate  statistically significant improvement in mobility and quality of life.  Baseline: 10/9: 53%  Goal status: INITIAL  2.  Patient (< 69 years old) will complete five times sit to stand test in < 10 seconds without UE support indicating an increased LE strength and improved balance. Baseline: 10/9: 19.6 seconds with walking stick; one LOB Goal status: INITIAL  3.  Patient will increase Berg Balance score by > 45/56 to demonstrate decreased fall risk during functional activities. Baseline: 10/9: 33/56 Goal status: INITIAL  4.  Patient will increase 10 meter walk test to >1.3ms as to improve gait speed for better community ambulation and to reduce fall risk. Baseline:  Goal status: INITIAL  5.  Patient will increase ABC scale score >80% to demonstrate better functional mobility and better confidence with ADLs.  Baseline: 10/9: 67%  Goal status: INITIAL    ASSESSMENT:  CLINICAL IMPRESSION: Patient tolerates stabilization interventions. Session is limited by recent surgery requiring decreased tension/demand on L leg.  Visual scans with ambulation and turning and reaching without AD tolerated well. Patient is highly motivated and eager for progression of mobility.  Patient will benefit from skilled physical therapy to improve strength, mobility, and decrease risk for falls for improved quality of life.    OBJECTIVE IMPAIRMENTS Abnormal gait, decreased activity tolerance, decreased balance, decreased coordination, decreased endurance, decreased mobility, difficulty walking, decreased strength, impaired flexibility, impaired sensation, improper body mechanics, and pain.   ACTIVITY LIMITATIONS carrying, lifting, bending, standing, squatting, sleeping, stairs, transfers, bed mobility, bathing, toileting, dressing, locomotion level, and caring for others  PARTICIPATION LIMITATIONS: meal prep, cleaning, laundry, medication management, personal finances, interpersonal relationship, driving,  shopping, community activity, and yard work  PERSONAL FACTORS Age, Fitness, Past/current experiences, Time since onset of  injury/illness/exacerbation, Transportation, and 3+ comorbidities: CAD, cryptogenic stroke (2021), deafness in R ear, diabetic retinopathy, dialysis, ESRD, COVID, HTN, hypothyroidism, leukocytosis, nephrolithiasis, neuropathy, Pseudotumor cerebri 1986, sepsis, DM type I, DM type II  are also affecting patient's functional outcome.   REHAB POTENTIAL: Good  CLINICAL DECISION MAKING: Evolving/moderate complexity  EVALUATION COMPLEXITY: Moderate  PLAN: PT FREQUENCY: 2x/week  PT DURATION: 12 weeks  PLANNED INTERVENTIONS: Therapeutic exercises, Therapeutic activity, Neuromuscular re-education, Balance training, Gait training, Patient/Family education, Self Care, Joint mobilization, Stair training, Vestibular training, Canalith repositioning, Visual/preceptual remediation/compensation, DME instructions, Dry Needling, Cognitive remediation, Spinal mobilization, Cryotherapy, Moist heat, Manual lymph drainage, Compression bandaging, Taping, Vasopneumatic device, Ultrasound, Manual therapy, and Re-evaluation  PLAN FOR NEXT SESSION: balance and strength   Janna Arch, PT 06/12/2022, 4:00 PM

## 2022-06-13 NOTE — Therapy (Signed)
OUTPATIENT PHYSICAL THERAPY NEURO TREATMENT   Patient Name: Gregory Crane MRN: [id number] DOB:09-Apr-1971, 51 y.o., male Today's Date: 06/17/2022   PCP: [name] MD REFERRING PROVIDER: [name] MD   PT End of Session - 06/17/22 1345     Visit Number 3    Number of Visits 24    Date for PT Re-Evaluation 08/26/22    Authorization Type 2/25 visits allowed: 2/10 eval 06/03/22    PT Start Time 3329    PT Stop Time 1430    PT Time Calculation (min) 45 min    Equipment Utilized During Treatment Gait belt    Activity Tolerance Patient tolerated treatment well    Behavior During Therapy WFL for tasks assessed/performed               Past Medical History:  Diagnosis Date   Allergy    Anemia    Bell's palsy    Diabetes mellitus without complication ([place])    diet controlled   Hypertension    Hypothyroidism    Kidney stones    Pseudotumor cerebri    Stroke [facility])    Past Surgical History:  Procedure Laterality Date   COLONOSCOPY WITH PROPOFOL N/A 03/30/2020   Procedure: COLONOSCOPY WITH PROPOFOL;  Surgeon: Lesly Rubenstein, MD;  Location: [facility] ENDOSCOPY;  Service: Endoscopy;  Laterality: N/A;   LOOP RECORDER INSERTION N/A 01/27/2018   Procedure: LOOP RECORDER INSERTION;  Surgeon: [name], MD;  Location: [place] CV LAB;  Service: Cardiovascular;  Laterality: N/A;   LUMBAR PUNCTURE     as child   NO PAST SURGERIES     TEE WITHOUT CARDIOVERSION N/A 01/07/2018   Procedure: TRANSESOPHAGEAL ECHOCARDIOGRAM (TEE);  Surgeon: Minna Merritts, MD;  Location: [facility] ORS;  Service: Cardiovascular;  Laterality: N/A;   Patient Active Problem List   Diagnosis Date Noted   Hydronephrosis, left 05/04/2022   Type II diabetes mellitus with renal manifestations ([place]) 05/04/2022   Leukocytosis 05/04/2022   Pre-op evaluation 04/24/2022   Headache 07/07/2021   Hearing loss 07/07/2021   Open wound 01/25/2021   History of colon polyps 10/15/2020   Postoperative  hemorrhage involving digestive system following digestive system procedure 09/19/2020   Acute cholecystitis without calculus 09/09/2020   Type 2 diabetes mellitus, with long-term current use of insulin ([place]) 09/09/2020   Cryptogenic stroke ([place]) 07/13/2020   History of loop recorder 07/13/2020   Weakness 06/18/2020   History of 2019 novel coronavirus disease (COVID-19) 05/20/2020   ESRD (end stage renal disease) ([place]) 04/04/2020   Pneumonia due to COVID-19 virus 04/03/2020   AKI (acute kidney injury) ([place]) 04/03/2020   Elevated troponin 04/03/2020   Acquired trigger finger 06/08/2019   Lymphedema 06/08/2019   Anemia 04/17/2019   Swelling of left lower extremity 01/10/2019   Facial droop 04/09/2018   Daytime somnolence 03/30/2018   Carotid artery disease ([place]) 02/03/2018   Intracranial vascular stenosis 09/12/2017   Cough 01/20/2017   Bell's palsy 11/10/2016   History of CVA (cerebrovascular accident) 11/10/2016   Benign localized hyperplasia of prostate with urinary obstruction 10/27/2016   History of nephrolithiasis 10/27/2016   TIA (transient ischemic attack) 10/20/2016   Near syncope 06/23/2016   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   Neuropathy 08/06/2015   Health care maintenance 08/06/2015   Essential hypertension 08/06/2015   Heme positive stool 10/10/2013   Hypothyroidism 10/10/2013   Microalbuminuria 10/10/2013   Hyperlipidemia 10/10/2013   B12 deficiency 10/10/2013   Diabetes ([place]) 07/04/2013   Environmental allergies 07/04/2013  ONSET DATE: 2-3 years  REFERRING DIAG: Neuropathy  THERAPY DIAG:  Unsteadiness on feet  Difficulty in walking, not elsewhere classified  Other abnormalities of gait and mobility  Muscle weakness (generalized)  Rationale for Evaluation and Treatment Rehabilitation  SUBJECTIVE:                                                                                                                                                                                               SUBJECTIVE STATEMENT: Patient walked in [place] prior to PT session today.   Pt accompanied by: self  PERTINENT HISTORY: Patient presents to physical therapy for neuropathy. He underwent banding of his L UE fistula on 05/13/22 and scheduled for additional banding on 06/07/22. 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 Patient reports his balance is very unsteady due to neuropathy in feet, feels weak in LE's.   PAIN:  Are you having pain? Yes: NPRS scale: 6-7/10 Pain location: bilateral feet Pain description: stinging tingling Aggravating factors: worse at night Relieving factors: lyrica   PRECAUTIONS: Fall  WEIGHT BEARING RESTRICTIONS Yes no lifting >5 lb in arm   FALLS: Has patient fallen in last 6 months? No  LIVING ENVIRONMENT: Lives with: lives alone Lives in: House/apartment Stairs: Yes: Internal: flight steps; on right going up Has following equipment at home: Single point cane and Walker - 2 wheeled  PLOF: Independent  PATIENT GOALS to be more steady and walk as normally as possible.   OBJECTIVE:       LOWER EXTREMITY MMT:    MMT Right Eval Left Eval  Hip flexion 4- 3+  Hip extension    Hip abduction 4- 4+  Hip adduction 4- 3+  Knee flexion 4 4-  Knee extension 4 4  Ankle dorsiflexion 2+ 2+  Ankle plantarflexion 3+ 3+  (Blank rows = not tested)     FUNCTIONAL TESTs:  5 times sit to stand: 19.6 seconds with walking stick; one LOB 10 meter walk test: 12 seconds Berg Balance Scale: 33/56  PATIENT SURVEYS:  ABC scale 67% FOTO 53%  TODAY'S TREATMENT:  Neuro Re-ed Airex pad: dual task with cognitive word game ; challenging with visual deficits.  Orange hurdle: -lateral step 10x each direction; BUE support -anterior/posterior step 10x  each LE; SUE support Single limb stance 30 seconds each LE; BUE support   TherEx Ambulate 150 ft without AD and with CGA.   Standing with # 4 ankle weight: CGA for stability  -Hip extension with  B upper extremity support, cueing for neutral hip alignment, upright posture for optimal muscle recruitment, and sequencing, 10x each LE,  -Hip abduction with B upper extremity support, cueing for neutral foot alignment for correct muscle activation, 10x each LE -Hip flexion with B upper extremity support, cueing for body mechanics, speed of muscle recruitment for optimal strengthening and stabilization 10x each LE -Hamstring curl with B upper extremity support, cueing for knee alignment for recruitment of hamstring musculature, 10x each LE   Seated with # 4 ankle weights  -Seated marches with upright posture, back away from back of chair for abdominal/trunk activation/stabilization, 10x each LE -Seated LAQ with 3 second holds, 10x each LE, cueing for muscle activation and sequencing for neutral alignment -Seated IR/ER with cueing for stabilizing knee placement with lateral foot movement for optimal muscle recruitment, 10x each LE -heel raises 15x   Standing: Heel raise 10x  Sit to stand 5x; no hands; eccentric loss of control on third   PATIENT EDUCATION: Education details: HEP, goals, POC Person educated: Patient Education method: Explanation, Demonstration, Tactile cues, Verbal cues, and Handouts Education comprehension: verbalized understanding, returned demonstration, verbal cues required, and tactile cues required   HOME EXERCISE PROGRAM: Access Code: [email or web address] URL: [email or web address] Date: 06/03/2022 Prepared by: Janna Arch  Exercises - Seated Heel Toe Raises  - 1 x daily - 7 x weekly - 2 sets - 10 reps - 5 hold - Standing Tandem Balance with Counter Support  - 1 x daily - 7 x weekly - 2 sets - 2 reps - 30 hold - Standing March with Counter Support  - 1 x daily  - 7 x weekly - 2 sets - 10 reps - 5 hold    GOALS: Goals reviewed with patient? Yes  SHORT TERM GOALS: Target date: 07/01/2022  Patient will be independent in home exercise program to improve strength/mobility for better functional independence with ADLs. Baseline:10/9; HEP given  Goal status: INITIAL    LONG TERM GOALS: Target date: 08/26/2022  Patient will increase FOTO score to equal to or greater than   63%  to demonstrate statistically significant improvement in mobility and quality of life.  Baseline: 10/9: 53%  Goal status: INITIAL  2.  Patient (< 8 years old) will complete five times sit to stand test in < 10 seconds without UE support indicating an increased LE strength and improved balance. Baseline: 10/9: 19.6 seconds with walking stick; one LOB Goal status: INITIAL  3.  Patient will increase Berg Balance score by > 45/56 to demonstrate decreased fall risk during functional activities. Baseline: 10/9: 33/56 Goal status: INITIAL  4.  Patient will increase 10 meter walk test to >1.48ms as to improve gait speed for better community ambulation and to reduce fall risk. Baseline:  Goal status: INITIAL  5.  Patient will increase ABC scale score >80% to demonstrate better functional mobility and better confidence with ADLs.  Baseline: 10/9: 67%  Goal status: INITIAL    ASSESSMENT:  CLINICAL IMPRESSION: Patient presents with excellent motivation. He is able to tolerate strengthening interventions for first time this session. Utilization of ankle weights does not pull on surgical site allowing for standing and seated LE strengthening interventions. Patient does continue to have some eccentric control issues that are improved from evaluation but will continue to be an area of focus.   Patient will benefit from skilled physical therapy to improve strength, mobility, and decrease risk for falls for improved quality of life.    OBJECTIVE IMPAIRMENTS Abnormal  gait, decreased  activity tolerance, decreased balance, decreased coordination, decreased endurance, decreased mobility, difficulty walking, decreased strength, impaired flexibility, impaired sensation, improper body mechanics, and pain.   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   REHAB POTENTIAL: Good  CLINICAL DECISION MAKING: Evolving/moderate complexity  EVALUATION COMPLEXITY: Moderate  PLAN: PT FREQUENCY: 2x/week  PT DURATION: 12 weeks  PLANNED INTERVENTIONS: Therapeutic exercises, Therapeutic activity, Neuromuscular re-education, Balance training, Gait training, Patient/Family education, Self Care, Joint mobilization, Stair training, Vestibular training, Canalith repositioning, Visual/preceptual remediation/compensation, DME instructions, Dry Needling, Cognitive remediation, Spinal mobilization, Cryotherapy, Moist heat, Manual lymph drainage, Compression bandaging, Taping, Vasopneumatic device, Ultrasound, Manual therapy, and Re-evaluation  PLAN FOR NEXT SESSION: balance and strength   Janna Arch, PT 06/17/2022, 2:30 PM

## 2022-06-17 ENCOUNTER — Ambulatory Visit: Payer: Medicare Other

## 2022-06-17 ENCOUNTER — Ambulatory Visit (INDEPENDENT_AMBULATORY_CARE_PROVIDER_SITE_OTHER): Payer: Medicare Other | Admitting: Podiatry

## 2022-06-17 ENCOUNTER — Encounter: Payer: Self-pay | Admitting: Podiatry

## 2022-06-17 DIAGNOSIS — M6281 Muscle weakness (generalized): Secondary | ICD-10-CM

## 2022-06-17 DIAGNOSIS — E0843 Diabetes mellitus due to underlying condition with diabetic autonomic (poly)neuropathy: Secondary | ICD-10-CM | POA: Diagnosis not present

## 2022-06-17 DIAGNOSIS — I890 Lymphedema, not elsewhere classified: Secondary | ICD-10-CM

## 2022-06-17 DIAGNOSIS — G629 Polyneuropathy, unspecified: Secondary | ICD-10-CM

## 2022-06-17 DIAGNOSIS — M79675 Pain in left toe(s): Secondary | ICD-10-CM

## 2022-06-17 DIAGNOSIS — R2689 Other abnormalities of gait and mobility: Secondary | ICD-10-CM

## 2022-06-17 DIAGNOSIS — R2681 Unsteadiness on feet: Secondary | ICD-10-CM

## 2022-06-17 DIAGNOSIS — R262 Difficulty in walking, not elsewhere classified: Secondary | ICD-10-CM

## 2022-06-17 DIAGNOSIS — B351 Tinea unguium: Secondary | ICD-10-CM

## 2022-06-17 DIAGNOSIS — N184 Chronic kidney disease, stage 4 (severe): Secondary | ICD-10-CM

## 2022-06-17 DIAGNOSIS — M79674 Pain in right toe(s): Secondary | ICD-10-CM

## 2022-06-17 NOTE — Progress Notes (Signed)
This patient presents to the office with chief complaint of long thick nails and diabetic feet.  This patient  says there  is  no pain and discomfort in his  feet.  This patient says there are long thick painful nails.  These nails are painful walking and wearing shoes.  Patient has no history of infection or drainage from both feet.  Patient is unable to  self treat his own nails . This patient presents  to the office today for treatment of the  long nails and a foot evaluation due to history of  diabetes.  General Appearance  Alert, conversant and in no acute stress.  Vascular  Dorsalis pedis are absent  B/L.Marland Kitchen  Posterior tibial  pulses are palpable  bilaterally.  Capillary return is within normal limits  bilaterally. Temperature is within normal limits  bilaterally.  Neurologic  Senn-Weinstein monofilament wire test absent  bilaterally. Muscle power within normal limits bilaterally.  Nails Thick disfigured discolored nails with subungual debris  from hallux to fifth toes bilaterally. No evidence of bacterial infection or drainage bilaterally.  Orthopedic  No limitations of motion of motion feet .  No crepitus or effusions noted.  No bony pathology or digital deformities noted.  MTA left foot.  Skin  normotropic skin with no porokeratosis noted bilaterally.  No signs of infections or ulcers noted.     Onychomycosis  Diabetes with no foot complications  IE  Debride nails x 10.  A diabetic foot exam was performed and there is evidence o vascular or neurologic pathology.  Continue to watch feet.  RTC 3 months.   Gardiner Barefoot DPM

## 2022-06-19 ENCOUNTER — Ambulatory Visit: Payer: Medicare Other

## 2022-06-19 DIAGNOSIS — R2681 Unsteadiness on feet: Secondary | ICD-10-CM | POA: Diagnosis not present

## 2022-06-19 DIAGNOSIS — R269 Unspecified abnormalities of gait and mobility: Secondary | ICD-10-CM

## 2022-06-19 DIAGNOSIS — M6281 Muscle weakness (generalized): Secondary | ICD-10-CM

## 2022-06-19 DIAGNOSIS — R278 Other lack of coordination: Secondary | ICD-10-CM

## 2022-06-19 DIAGNOSIS — R262 Difficulty in walking, not elsewhere classified: Secondary | ICD-10-CM

## 2022-06-19 DIAGNOSIS — R2689 Other abnormalities of gait and mobility: Secondary | ICD-10-CM

## 2022-06-19 NOTE — Therapy (Signed)
OUTPATIENT PHYSICAL THERAPY NEURO TREATMENT   Patient Name: Gregory Crane MRN: [id number] DOB:09/17/1970, 51 y.o., male Today's Date: 06/20/2022   PCP: [name] MD REFERRING PROVIDER: [name] MD   PT End of Session - 06/19/22 1306     Visit Number 4    Number of Visits 24    Date for PT Re-Evaluation 08/26/22    Authorization Type 4/25 visits allowed: 4/10 eval 06/03/22    PT Start Time 2671    PT Stop Time 1344    PT Time Calculation (min) 39 min    Equipment Utilized During Treatment Gait belt    Activity Tolerance Patient tolerated treatment well    Behavior During Therapy WFL for tasks assessed/performed                Past Medical History:  Diagnosis Date   Allergy    Anemia    Bell's palsy    Diabetes mellitus without complication ([place])    diet controlled   Hypertension    Hypothyroidism    Kidney stones    Pseudotumor cerebri    Stroke [facility])    Past Surgical History:  Procedure Laterality Date   COLONOSCOPY WITH PROPOFOL N/A 03/30/2020   Procedure: COLONOSCOPY WITH PROPOFOL;  Surgeon: Lesly Rubenstein, MD;  Location: [facility] ENDOSCOPY;  Service: Endoscopy;  Laterality: N/A;   LOOP RECORDER INSERTION N/A 01/27/2018   Procedure: LOOP RECORDER INSERTION;  Surgeon: [name], MD;  Location: [facility];  Service: Cardiovascular;  Laterality: N/A;   LUMBAR PUNCTURE     as child   NO PAST SURGERIES     TEE WITHOUT CARDIOVERSION N/A 01/07/2018   Procedure: TRANSESOPHAGEAL ECHOCARDIOGRAM (TEE);  Surgeon: Minna Merritts, MD;  Location: [facility] ORS;  Service: Cardiovascular;  Laterality: N/A;   Patient Active Problem List   Diagnosis Date Noted   Hydronephrosis, left 05/04/2022   Type II diabetes mellitus with renal manifestations ([place]) 05/04/2022   Leukocytosis 05/04/2022   Pre-op evaluation 04/24/2022   Headache 07/07/2021   Hearing loss 07/07/2021   Open wound 01/25/2021   History of colon polyps 10/15/2020   Postoperative  hemorrhage involving digestive system following digestive system procedure 09/19/2020   Acute cholecystitis without calculus 09/09/2020   Type 2 diabetes mellitus, with long-term current use of insulin ([place]) 09/09/2020   Cryptogenic stroke ([place]) 07/13/2020   History of loop recorder 07/13/2020   Weakness 06/18/2020   History of 2019 novel coronavirus disease (COVID-19) 05/20/2020   ESRD (end stage renal disease) ([place]) 04/04/2020   Pneumonia due to COVID-19 virus 04/03/2020   AKI (acute kidney injury) ([place]) 04/03/2020   Elevated troponin 04/03/2020   Acquired trigger finger 06/08/2019   Lymphedema 06/08/2019   Anemia 04/17/2019   Swelling of left lower extremity 01/10/2019   Facial droop 04/09/2018   Daytime somnolence 03/30/2018   Carotid artery disease ([place]) 02/03/2018   Intracranial vascular stenosis 09/12/2017   Cough 01/20/2017   Bell's palsy 11/10/2016   History of CVA (cerebrovascular accident) 11/10/2016   Benign localized hyperplasia of prostate with urinary obstruction 10/27/2016   History of nephrolithiasis 10/27/2016   TIA (transient ischemic attack) 10/20/2016   Near syncope 06/23/2016   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   Neuropathy 08/06/2015   Health care maintenance 08/06/2015   Essential hypertension 08/06/2015   Heme positive stool 10/10/2013   Hypothyroidism 10/10/2013   Microalbuminuria 10/10/2013   Hyperlipidemia 10/10/2013   B12 deficiency 10/10/2013   Diabetes ([place]) 07/04/2013   Environmental allergies  07/04/2013    ONSET DATE: 2-3 years  REFERRING DIAG: Neuropathy  THERAPY DIAG:  Unsteadiness on feet  Difficulty in walking, not elsewhere classified  Other abnormalities of gait and mobility  Muscle weakness (generalized)  Abnormality of gait and mobility  Other lack of coordination  Rationale for Evaluation and Treatment Rehabilitation  SUBJECTIVE:                                                                                                                                                                                               SUBJECTIVE STATEMENT: Patient reports doing more walking and no particular soreness after last session. Went to [employer] and did quite a bit of walking.   Pt accompanied by: self  PERTINENT HISTORY: Patient presents to physical therapy for neuropathy. He underwent banding of his L UE fistula on 05/13/22 and scheduled for additional banding on 06/07/22. 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 Patient reports his balance is very unsteady due to neuropathy in feet, feels weak in LE's.   PAIN:  Are you having pain? Yes: NPRS scale: 5/10 Pain location: bilateral feet Pain description: stinging tingling Aggravating factors: worse at night Relieving factors: lyrica   PRECAUTIONS: Fall  WEIGHT BEARING RESTRICTIONS Yes no lifting >5 lb in arm   FALLS: Has patient fallen in last 6 months? No  LIVING ENVIRONMENT: Lives with: lives alone Lives in: House/apartment Stairs: Yes: Internal: flight steps; on right going up Has following equipment at home: Single point cane and Walker - 2 wheeled  PLOF: Independent  PATIENT GOALS to be more steady and walk as normally as possible.   OBJECTIVE:       LOWER EXTREMITY MMT:    MMT Right Eval Left Eval  Hip flexion 4- 3+  Hip extension    Hip abduction 4- 4+  Hip adduction 4- 3+  Knee flexion 4 4-  Knee extension 4 4  Ankle dorsiflexion 2+ 2+  Ankle plantarflexion 3+ 3+  (Blank rows = not tested)     FUNCTIONAL TESTs:  5 times sit to stand: 19.6 seconds with walking stick; one LOB 10 meter walk test: 12 seconds Berg Balance Scale: 33/56  PATIENT SURVEYS:  ABC scale 67% FOTO 53%  TODAY'S TREATMENT:    TherEx Ambulate 150 ft without AD and with  CGA. *Mild unsteadiness noted yet no LOB with turning or walking on firm surface today.  Standing with # 4 ankle weight: CGA for stability   -Hip extension  with B upper extremity support, cueing for neutral hip alignment, upright posture for optimal muscle recruitment, and sequencing, 10x each LE,   -Hip abduction with B upper extremity support, cueing for neutral foot alignment for correct muscle activation, 10x each LE  -Hip flexion with B upper extremity support, cueing for body mechanics, speed of muscle recruitment for optimal strengthening and stabilization 10x each LE  -Hamstring curl with B upper extremity support, cueing for knee alignment for recruitment of hamstring musculature, 10x each LE  - Calf raises B LE x 10 reps (VC for slow eccentric control)   Seated with # 4 ankle weights   -Seated marches with upright posture, back away from back of chair for abdominal/trunk activation/stabilization, 10x each LE -Seated LAQ with 3 second holds and VC for slow eccentric control, 10x each LE, cueing for muscle activation and sequencing for neutral alignment  -Seated Hip flex/abd up/over green therapad with 4lb AW-  10x each LE -heel raises (toes on 1/2 foam) with 4lb AW  -Sit to stand 10x; no hands; VC for eccentric control     PATIENT EDUCATION: Education details: HEP, goals, POC Person educated: Patient Education method: Explanation, Demonstration, Tactile cues, Verbal cues, and Handouts Education comprehension: verbalized understanding, returned demonstration, verbal cues required, and tactile cues required   HOME EXERCISE PROGRAM: Access Code: [email or web address] URL: [email or web address] Date: 06/03/2022 Prepared by: Janna Arch  Exercises - Seated Heel Toe Raises  - 1 x daily - 7 x weekly - 2 sets - 10 reps - 5 hold - Standing Tandem Balance with Counter Support  - 1 x daily - 7 x weekly - 2 sets - 2 reps - 30 hold - Standing March with Counter Support  - 1 x daily -  7 x weekly - 2 sets - 10 reps - 5 hold    GOALS: Goals reviewed with patient? Yes  SHORT TERM GOALS: Target date: 07/01/2022  Patient will be independent in home exercise program to improve strength/mobility for better functional independence with ADLs. Baseline:10/9; HEP given  Goal status: INITIAL    LONG TERM GOALS: Target date: 08/26/2022  Patient will increase FOTO score to equal to or greater than   63%  to demonstrate statistically significant improvement in mobility and quality of life.  Baseline: 10/9: 53%  Goal status: INITIAL  2.  Patient (< 51 years old) will complete five times sit to stand test in < 10 seconds without UE support indicating an increased LE strength and improved balance. Baseline: 10/9: 19.6 seconds with walking stick; one LOB Goal status: INITIAL  3.  Patient will increase Berg Balance score by > 45/56 to demonstrate decreased fall risk during functional activities. Baseline: 10/9: 33/56 Goal status: INITIAL  4.  Patient will increase 10 meter walk test to >1.18ms as to improve gait speed for better community ambulation and to reduce fall risk. Baseline:  Goal status: INITIAL  5.  Patient will increase ABC scale score >80% to demonstrate better functional mobility and better confidence with ADLs.  Baseline: 10/9: 67%  Goal status: INITIAL    ASSESSMENT:  CLINICAL IMPRESSION: Patient continues to present with excellent motivation for all activities performed today. He responded well to resistive strengthening interventions again today. Continued to emphasize the importance of eccentric control to maximize his strength gains. He was fatigued at end of session yet able to double the amount of reps with sit to stand today.  Patient will benefit from skilled physical therapy to improve strength, mobility, and  decrease risk for falls for improved quality of life.    OBJECTIVE IMPAIRMENTS Abnormal gait, decreased activity tolerance, decreased balance,  decreased coordination, decreased endurance, decreased mobility, difficulty walking, decreased strength, impaired flexibility, impaired sensation, improper body mechanics, and pain.   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   REHAB POTENTIAL: Good  CLINICAL DECISION MAKING: Evolving/moderate complexity  EVALUATION COMPLEXITY: Moderate  PLAN: PT FREQUENCY: 2x/week  PT DURATION: 12 weeks  PLANNED INTERVENTIONS: Therapeutic exercises, Therapeutic activity, Neuromuscular re-education, Balance training, Gait training, Patient/Family education, Self Care, Joint mobilization, Stair training, Vestibular training, Canalith repositioning, Visual/preceptual remediation/compensation, DME instructions, Dry Needling, Cognitive remediation, Spinal mobilization, Cryotherapy, Moist heat, Manual lymph drainage, Compression bandaging, Taping, Vasopneumatic device, Ultrasound, Manual therapy, and Re-evaluation  PLAN FOR NEXT SESSION: balance and strength   Lewis Moccasin, PT 06/20/2022, 6:48 AM

## 2022-06-20 NOTE — Telephone Encounter (Signed)
Left voice mail to call back 

## 2022-06-24 ENCOUNTER — Ambulatory Visit (INDEPENDENT_AMBULATORY_CARE_PROVIDER_SITE_OTHER): Payer: Medicare Other | Admitting: Internal Medicine

## 2022-06-24 ENCOUNTER — Encounter (INDEPENDENT_AMBULATORY_CARE_PROVIDER_SITE_OTHER): Payer: Self-pay

## 2022-06-24 ENCOUNTER — Other Ambulatory Visit: Payer: Self-pay | Admitting: Internal Medicine

## 2022-06-24 ENCOUNTER — Encounter: Payer: Self-pay | Admitting: Internal Medicine

## 2022-06-24 VITALS — BP 140/68 | HR 76 | Temp 98.0°F | Resp 18 | Ht 71.0 in | Wt 246.0 lb

## 2022-06-24 DIAGNOSIS — I779 Disorder of arteries and arterioles, unspecified: Secondary | ICD-10-CM | POA: Diagnosis not present

## 2022-06-24 DIAGNOSIS — N186 End stage renal disease: Secondary | ICD-10-CM

## 2022-06-24 DIAGNOSIS — D649 Anemia, unspecified: Secondary | ICD-10-CM

## 2022-06-24 DIAGNOSIS — Z8673 Personal history of transient ischemic attack (TIA), and cerebral infarction without residual deficits: Secondary | ICD-10-CM

## 2022-06-24 DIAGNOSIS — N183 Chronic kidney disease, stage 3 unspecified: Secondary | ICD-10-CM

## 2022-06-24 DIAGNOSIS — E11319 Type 2 diabetes mellitus with unspecified diabetic retinopathy without macular edema: Secondary | ICD-10-CM | POA: Diagnosis not present

## 2022-06-24 DIAGNOSIS — Z86010 Personal history of colonic polyps: Secondary | ICD-10-CM

## 2022-06-24 DIAGNOSIS — E1122 Type 2 diabetes mellitus with diabetic chronic kidney disease: Secondary | ICD-10-CM

## 2022-06-24 DIAGNOSIS — E785 Hyperlipidemia, unspecified: Secondary | ICD-10-CM

## 2022-06-24 DIAGNOSIS — Z794 Long term (current) use of insulin: Secondary | ICD-10-CM

## 2022-06-24 DIAGNOSIS — I639 Cerebral infarction, unspecified: Secondary | ICD-10-CM

## 2022-06-24 DIAGNOSIS — E039 Hypothyroidism, unspecified: Secondary | ICD-10-CM

## 2022-06-24 DIAGNOSIS — I10 Essential (primary) hypertension: Secondary | ICD-10-CM

## 2022-06-24 MED ORDER — PREGABALIN 100 MG PO CAPS: 100.0000 mg | ORAL_CAPSULE | Freq: Three times a day (TID) | ORAL | 1 refills | Status: DC

## 2022-06-24 NOTE — Progress Notes (Signed)
Patient ID: Gregory Crane, male   DOB: [birth year], 51 y.o.   MRN: [id number]   Subjective:    Patient ID: Gregory Crane, male    DOB: [birth year], 51 y.o.   MRN: [id number]   Patient here for  Chief Complaint  Patient presents with   Follow-up   .   HPI Here to follow up regarding diabetes, hypertension, ESRD and hypercholesterolemia.  He is accompanied by his sister.  History obtained from both of them. Saw endocrinology 06/12/22 - continue lantus 15 units q hs and humalog 4-10 units tid with meals.  Dexcom.  Sugars improved.  S/p banding LUE fistula 05/13/22 and 06/07/22.  Has been evaluated for kidney transplant.  Need repeat colonoscopy. Discussed.  OT - left hand.  Overall getting around better.  Decreased swelling.  Tolerating dialysis.    Past Medical History:  Diagnosis Date   Allergy    Anemia    Bell's palsy    Diabetes mellitus without complication ([place])    diet controlled   Hypertension    Hypothyroidism    Kidney stones    Pseudotumor cerebri    Stroke [facility])    Past Surgical History:  Procedure Laterality Date   COLONOSCOPY WITH PROPOFOL N/A 03/30/2020   Procedure: COLONOSCOPY WITH PROPOFOL;  Surgeon: Lesly Rubenstein, MD;  Location: [facility] ENDOSCOPY;  Service: Endoscopy;  Laterality: N/A;   LOOP RECORDER INSERTION N/A 01/27/2018   Procedure: LOOP RECORDER INSERTION;  Surgeon: [name], MD;  Location: [place] [facility];  Service: Cardiovascular;  Laterality: N/A;   LUMBAR PUNCTURE     as child   NO PAST SURGERIES     TEE WITHOUT CARDIOVERSION N/A 01/07/2018   Procedure: TRANSESOPHAGEAL ECHOCARDIOGRAM (TEE);  Surgeon: Minna Merritts, MD;  Location: [facility] ORS;  Service: Cardiovascular;  Laterality: N/A;   Family History  Problem Relation Age of Onset   Breast cancer Mother    Diabetes Father    Diabetes Sister    Arthritis Maternal Grandmother    Diabetes Maternal Grandmother    Social History   Socioeconomic History   Marital status: Widowed     Spouse name: [name]   Number of children: 2   Years of education: Not on file   Highest education level: Not on file  Occupational History   Not on file  Tobacco Use   Smoking status: Never   Smokeless tobacco: Never  Vaping Use   Vaping Use: Never used  Substance and Sexual Activity   Alcohol use: No    Alcohol/week: 0.0 standard drinks of alcohol    Comment: very rarely (1-2/year)   Drug use: No   Sexual activity: Yes  Other Topics Concern   Not on file  Social History Narrative   Lives at home with wife, independent at baseline.   Social Determinants of Health   Financial Resource Strain: High Risk (06/26/2021)   Overall Financial Resource Strain (CARDIA)    Difficulty of Paying Living Expenses: Hard  Food Insecurity: No Food Insecurity (05/05/2022)   Hunger Vital Sign    Worried About Running Out of Food in the Last Year: Never true    Ran Out of Food in the Last Year: Never true  Transportation Needs: No Transportation Needs (05/05/2022)   PRAPARE - Hydrologist (Medical): No    Lack of Transportation (Non-Medical): No  Physical Activity: Inactive (09/13/2017)   Exercise Vital Sign    Days of Exercise per Week: 0  days    Minutes of Exercise per Session: 0 min  Stress: Stress Concern Present (09/13/2017)   [place]    Feeling of Stress : To some extent  Social Connections: Moderately Integrated (09/13/2017)   Social Connection and Isolation Panel [NHANES]    Frequency of Communication with Friends and Family: More than three times a week    Frequency of Social Gatherings with Friends and Family: More than three times a week    Attends Religious Services: More than 4 times per year    Active Member of [employer] or Organizations: No    Attends Archivist Meetings: Never    Marital Status: Married     Review of Systems  Constitutional:  Negative for appetite change  and unexpected weight change.  HENT:  Negative for congestion.   Respiratory:  Negative for cough and chest tightness.        Breathing stable.   Cardiovascular:  Negative for chest pain and palpitations.       No increased swelling.   Gastrointestinal:  Negative for abdominal pain, diarrhea, nausea and vomiting.  Genitourinary:  Negative for difficulty urinating and dysuria.  Musculoskeletal:  Negative for joint swelling and myalgias.  Skin:  Negative for color change and rash.  Neurological:  Negative for dizziness and headaches.  Psychiatric/Behavioral:  Negative for agitation and dysphoric mood.        Objective:     BP (!) 140/68 (BP Location: Right Arm, Patient Position: Sitting, Cuff Size: Small)   Pulse 76   Temp 98 F (36.7 C) (Temporal)   Resp 18   Ht _0  (1.803 m)   Wt 246 lb (111.6 kg)   SpO2 98%   BMI 34.31 kg/m  Wt Readings from Last 3 Encounters:  06/24/22 246 lb (111.6 kg)  06/12/22 245 lb 8 oz (111.4 kg)  05/04/22 246 lb 14.6 oz (112 kg)    Physical Exam Vitals reviewed.  Constitutional:      General: He is not in acute distress.    Appearance: Normal appearance. He is well-developed.  HENT:     Head: Normocephalic and atraumatic.     Right Ear: External ear normal.     Left Ear: External ear normal.  Eyes:     General: No scleral icterus.       Right eye: No discharge.        Left eye: No discharge.     Conjunctiva/sclera: Conjunctivae normal.  Cardiovascular:     Rate and Rhythm: Normal rate and regular rhythm.  Pulmonary:     Effort: Pulmonary effort is normal. No respiratory distress.     Breath sounds: Normal breath sounds.  Abdominal:     General: Bowel sounds are normal.     Palpations: Abdomen is soft.     Tenderness: There is no abdominal tenderness.  Musculoskeletal:        General: No tenderness.     Cervical back: Neck supple. No tenderness.     Comments: Pedal/ankle and lower extremity swelling - improved.    Lymphadenopathy:     Cervical: No cervical adenopathy.  Skin:    Findings: No erythema or rash.  Neurological:     Mental Status: He is alert.  Psychiatric:        Mood and Affect: Mood normal.        Behavior: Behavior normal.      Outpatient Encounter Medications as of 06/24/2022  Medication  Sig   Accu-Chek FastClix Lancets MISC USE TO CHECK BLOOD SUGAR THREE TIMES DAILY AS DIRECTED   amLODipine (NORVASC) 10 MG tablet TAKE 1 TABLET(10 MG) BY MOUTH DAILY   aspirin (ASPIRIN LOW DOSE) 81 MG EC tablet TAKE 1 TABLET(81 MG) BY MOUTH DAILY   glucose blood (FREESTYLE LITE) test strip Check blood sugars twice a day (Dx. 250.02)   hydrALAZINE (APRESOLINE) 50 MG tablet TAKE 1 TABLET(50 MG) BY MOUTH THREE TIMES DAILY   insulin glargine (LANTUS) 100 UNIT/ML injection Inject 0.13 mLs (13 Units total) into the skin once daily. (Patient taking differently: Inject 10-15 Units into the skin daily.)   insulin lispro (HUMALOG) 100 UNIT/ML injection Inject 4-12 units three times daily before meals per sliding scale: If premeal sugar is <130, inject 4 units; if 131-180, inject 6 units; if 181-240, inject 8 units; if 241-300, inject 10 units, if  >300, inject 12 units (max daily dose 36 units)   Insulin Syringe-Needle U-100 (INSULIN SYRINGE .5CC/30GX5/16") 30G X 5/16" 0.5 ML MISC USE AS DIRECTED WITH INSULIN   levothyroxine (SYNTHROID) 175 MCG tablet TAKE 1 TABLET (175 MCG) BY MOUTH ONCE DAILY BEFORE BREAKFAST.   pregabalin (LYRICA) 100 MG capsule Take 1 capsule (100 mg total) by mouth 3 (three) times daily.   RENVELA 800 MG tablet Take 800 mg by mouth 3 (three) times daily.   Syringe/Needle, Disp, (SYRINGE 3CC/25GX1") 25G X 1" 3 ML MISC Use as directed to administer b12 injections.   [DISCONTINUED] amLODipine (NORVASC) 10 MG tablet Take 10 mg by mouth daily.   [facility]) OINT Apply to affected area bid prn (Patient not taking: Reported on 06/17/2022)   [DISCONTINUED]  mupirocin ointment (BACTROBAN) 2 % Apply 1 Application topically 2 (two) times daily. (Patient not taking: Reported on 06/17/2022)   [DISCONTINUED] oxyCODONE-acetaminophen (PERCOCET/ROXICET) 5-325 MG tablet Take 1 tablet by mouth every 4 (four) hours as needed for moderate pain. (Patient not taking: Reported on 06/17/2022)   [DISCONTINUED] pregabalin (LYRICA) 100 MG capsule TAKE 1 CAPSULE(100 MG) BY MOUTH THREE TIMES DAILY (Patient taking differently: daily.)   No facility-administered encounter medications on file as of 06/24/2022.     Lab Results  Component Value Date   WBC 10.4 05/05/2022   HGB 12.0 (L) 05/05/2022   HCT 36.2 (L) 05/05/2022   PLT 213 05/05/2022   GLUCOSE 193 (H) 05/05/2022   CHOL 219 (H) 06/26/2021   TRIG 332.0 (H) 06/26/2021   HDL 31.30 (L) 06/26/2021   LDLDIRECT 107.0 06/26/2021   LDLCALC 66 01/08/2021   ALT 12 05/04/2022   AST 18 05/04/2022   NA 138 05/05/2022   K 3.3 (L) 05/05/2022   CL 103 05/05/2022   CREATININE 3.70 (H) 05/05/2022   BUN 31 (H) 05/05/2022   CO2 28 05/05/2022   TSH 13.61 (H) 06/26/2021   INR 1.0 05/04/2022   HGBA1C 6.9 (A) 04/24/2022    CT Renal Stone Study  Result Date: 05/04/2022 CLINICAL DATA:  Abdominal pain, back pain EXAM: CT ABDOMEN AND PELVIS WITHOUT CONTRAST TECHNIQUE: Multidetector CT imaging of the abdomen and pelvis was performed following the standard protocol without IV contrast. RADIATION DOSE REDUCTION: This exam was performed according to the departmental dose-optimization program which includes automated exposure control, adjustment of the mA and/or kV according to patient size and/or use of iterative reconstruction technique. COMPARISON:  Previous CT pelvis done on 12/12/2021 and renal sonogram done on 06/30/2019 FINDINGS: Lower chest: Heart is enlarged in size. No focal infiltrates are seen in  the visualized lower lung fields. There is minimal pericardial effusion. Coronary artery calcifications are seen. Hepatobiliary:  Liver measures 19.3 cm in length. No focal abnormalities are seen. There is no dilation of bile ducts. Surgical clips are seen in gallbladder fossa. Pancreas: No focal abnormalities are seen. Spleen: Spleen measures 17.2 cm in maximum diameter. Adrenals/Urinary Tract: Adrenals are unremarkable. There is no hydronephrosis in the right kidney. There is moderate left hydronephrosis. There is possible tiny 1 mm calculus in the lower pole of left kidney. Calcification is seen in renal artery branch in the upper pole of right kidney. There is stranding in perinephric fat planes on both sides. Left ureter is dilated to the ureterovesical junction. There are no demonstrable opaque ureteral calculi. Urinary bladder is unremarkable. Stomach/Bowel: Small hiatal hernia is seen. Small bowel loops are not dilated. Appendix is not dilated. There is no significant wall thickening in colon. Vascular/Lymphatic: Scattered arterial calcifications are seen. Reproductive: Unremarkable. Other: There is no ascites or pneumoperitoneum. Musculoskeletal: No acute findings are seen. IMPRESSION: There is moderate left hydronephrosis. There are no demonstrable opaque ureteral calculi. Findings may suggest recent passage of left ureteral calculus or stricture at the left ureterovesical junction or obstruction by non opaque calculus. There is possible 1 mm calculus in the lower pole of left kidney. There is no hydronephrosis in the right kidney. There is significant perinephric stranding around both kidneys which may be due to recent or previous ureteric obstructions and pyelonephritis. There is no evidence of intestinal obstruction or pneumoperitoneum. Appendix is not dilated. Enlarged liver and spleen. Minimal pericardial effusion. Small hiatal hernia. Coronary artery calcifications are seen. Other findings as described in the body of the report. Electronically Signed   By: Elmer Picker M.D.   On: 05/04/2022 14:47       Assessment &  Plan:   Problem List Items Addressed This Visit     Anemia - Primary    Labs being drawn through dialysis.  Obtain results.       Carotid artery disease (HCC)    Last carotid ultrasound - <50% bilaterally.  Have discussed need to take statin and keep blood pressure under control.       Diabetes [facility])    Sees endocrinology. Low carb diet.  Dexcom.  Follow.  Most recent A1c 6.5%.       ESRD (end stage renal disease) [facility])    Sees nephrology.  On dialysis.  Tolerating.       Essential hypertension    Blood pressure has been doing better.  Continue hydralazine and amlodipine.  Follow pressures.  Follow metabolic panel.       History of colon polyps    Colonoscopy 03/2020 - multiple polyps.  Recommended f/u in 6 months.  Overdue.  Will need to schedule f/u.       Relevant Orders   Ambulatory referral to Gastroenterology   History of CVA (cerebrovascular accident)    Continue aspirin and lipitor.  Continue blood pressure control.         Hyperlipidemia    Low cholesterol diet and exercise.  Follow lipid panel and liver function tests.       Hypothyroidism    On thyroid replacement.  Follow tsh. Instructed on importance of taking regularly.        Type 2 diabetes mellitus, with long-term current use of insulin (HCC)    Low carb diet and exercise.  No change in medication.  Seeing endocrinology.  Last A1c reported 6.5. Follow  met b and a1c.         [name], MD

## 2022-06-26 ENCOUNTER — Ambulatory Visit: Payer: Medicare Other | Attending: Internal Medicine

## 2022-06-26 DIAGNOSIS — R2681 Unsteadiness on feet: Secondary | ICD-10-CM | POA: Insufficient documentation

## 2022-06-26 DIAGNOSIS — M6281 Muscle weakness (generalized): Secondary | ICD-10-CM | POA: Insufficient documentation

## 2022-06-26 DIAGNOSIS — R269 Unspecified abnormalities of gait and mobility: Secondary | ICD-10-CM | POA: Insufficient documentation

## 2022-06-26 DIAGNOSIS — R2689 Other abnormalities of gait and mobility: Secondary | ICD-10-CM | POA: Insufficient documentation

## 2022-06-26 DIAGNOSIS — R278 Other lack of coordination: Secondary | ICD-10-CM | POA: Insufficient documentation

## 2022-06-26 DIAGNOSIS — R262 Difficulty in walking, not elsewhere classified: Secondary | ICD-10-CM | POA: Insufficient documentation

## 2022-06-30 ENCOUNTER — Encounter: Payer: Self-pay | Admitting: Internal Medicine

## 2022-06-30 NOTE — Assessment & Plan Note (Signed)
Low carb diet and exercise.  No change in medication.  Seeing endocrinology.  Last A1c reported 6.5. Follow met b and a1c.

## 2022-06-30 NOTE — Assessment & Plan Note (Signed)
Low cholesterol diet and exercise.  Follow lipid panel and liver function tests.  

## 2022-06-30 NOTE — Assessment & Plan Note (Signed)
Blood pressure has been doing better.  Continue hydralazine and amlodipine.  Follow pressures.  Follow metabolic panel.  

## 2022-06-30 NOTE — Assessment & Plan Note (Addendum)
Sees endocrinology. Low carb diet.  Dexcom.  Follow.  Most recent A1c 6.5%.

## 2022-06-30 NOTE — Assessment & Plan Note (Addendum)
Continue aspirin and lipitor.  Continue blood pressure control.    

## 2022-06-30 NOTE — Assessment & Plan Note (Signed)
Last carotid ultrasound - <50% bilaterally.  Have discussed need to take statin and keep blood pressure under control.

## 2022-06-30 NOTE — Assessment & Plan Note (Signed)
Labs being drawn through dialysis.  Obtain results.

## 2022-06-30 NOTE — Assessment & Plan Note (Signed)
Colonoscopy 03/2020 - multiple polyps.  Recommended f/u in 6 months.  Overdue.  Will need to schedule f/u.

## 2022-06-30 NOTE — Assessment & Plan Note (Signed)
On thyroid replacement.  Follow tsh. Instructed on importance of taking regularly.   

## 2022-06-30 NOTE — Assessment & Plan Note (Signed)
Sees nephrology.  On dialysis.  Tolerating.

## 2022-07-01 ENCOUNTER — Ambulatory Visit: Payer: Medicare Other

## 2022-07-01 DIAGNOSIS — M6281 Muscle weakness (generalized): Secondary | ICD-10-CM | POA: Diagnosis present

## 2022-07-01 DIAGNOSIS — R262 Difficulty in walking, not elsewhere classified: Secondary | ICD-10-CM | POA: Diagnosis not present

## 2022-07-01 DIAGNOSIS — R278 Other lack of coordination: Secondary | ICD-10-CM | POA: Diagnosis present

## 2022-07-01 DIAGNOSIS — R269 Unspecified abnormalities of gait and mobility: Secondary | ICD-10-CM

## 2022-07-01 DIAGNOSIS — R2681 Unsteadiness on feet: Secondary | ICD-10-CM | POA: Diagnosis present

## 2022-07-01 DIAGNOSIS — R2689 Other abnormalities of gait and mobility: Secondary | ICD-10-CM | POA: Diagnosis present

## 2022-07-01 NOTE — Therapy (Signed)
OUTPATIENT PHYSICAL THERAPY NEURO TREATMENT   Patient Name: Gregory Crane MRN: [id number] DOB:Nov 11, 1970, 51 y.o., male Today's Date: 07/01/2022  PCP: [name] MD REFERRING PROVIDER: [name] MD  PT End of Session - 07/01/22 1015     Visit Number 5    Number of Visits 24    Date for PT Re-Evaluation 08/26/22    Authorization Type 4/25 visits allowed: 4/10 eval 06/03/22    PT Start Time 1016    PT Stop Time 1100    PT Time Calculation (min) 44 min    Equipment Utilized During Treatment Gait belt    Activity Tolerance Patient tolerated treatment well    Behavior During Therapy WFL for tasks assessed/performed            Past Medical History:  Diagnosis Date   Allergy    Anemia    Bell's palsy    Diabetes mellitus without complication ([place])    diet controlled   Hypertension    Hypothyroidism    Kidney stones    Pseudotumor cerebri    Stroke [facility])    Past Surgical History:  Procedure Laterality Date   COLONOSCOPY WITH PROPOFOL N/A 03/30/2020   Procedure: COLONOSCOPY WITH PROPOFOL;  Surgeon: Lesly Rubenstein, MD;  Location: [facility] ENDOSCOPY;  Service: Endoscopy;  Laterality: N/A;   LOOP RECORDER INSERTION N/A 01/27/2018   Procedure: LOOP RECORDER INSERTION;  Surgeon: [name], MD;  Location: [place] CV LAB;  Service: Cardiovascular;  Laterality: N/A;   LUMBAR PUNCTURE     as child   NO PAST SURGERIES     TEE WITHOUT CARDIOVERSION N/A 01/07/2018   Procedure: TRANSESOPHAGEAL ECHOCARDIOGRAM (TEE);  Surgeon: Minna Merritts, MD;  Location: [facility] ORS;  Service: Cardiovascular;  Laterality: N/A;   Patient Active Problem List   Diagnosis Date Noted   Hydronephrosis, left 05/04/2022   Type II diabetes mellitus with renal manifestations ([place]) 05/04/2022   Leukocytosis 05/04/2022   Pre-op evaluation 04/24/2022   Headache 07/07/2021   Hearing loss 07/07/2021   Open wound 01/25/2021   History of colon polyps 10/15/2020   Postoperative hemorrhage  involving digestive system following digestive system procedure 09/19/2020   Acute cholecystitis without calculus 09/09/2020   Type 2 diabetes mellitus, with long-term current use of insulin ([place]) 09/09/2020   Cryptogenic stroke ([place]) 07/13/2020   History of loop recorder 07/13/2020   Weakness 06/18/2020   History of 2019 novel coronavirus disease (COVID-19) 05/20/2020   ESRD (end stage renal disease) ([place]) 04/04/2020   Pneumonia due to COVID-19 virus 04/03/2020   AKI (acute kidney injury) ([place]) 04/03/2020   Elevated troponin 04/03/2020   Acquired trigger finger 06/08/2019   Lymphedema 06/08/2019   Anemia 04/17/2019   Swelling of left lower extremity 01/10/2019   Facial droop 04/09/2018   Daytime somnolence 03/30/2018   Carotid artery disease ([place]) 02/03/2018   Intracranial vascular stenosis 09/12/2017   Cough 01/20/2017   Bell's palsy 11/10/2016   History of CVA (cerebrovascular accident) 11/10/2016   Benign localized hyperplasia of prostate with urinary obstruction 10/27/2016   History of nephrolithiasis 10/27/2016   TIA (transient ischemic attack) 10/20/2016   Near syncope 06/23/2016   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   Neuropathy 08/06/2015   Health care maintenance 08/06/2015   Essential hypertension 08/06/2015   Heme positive stool 10/10/2013   Hypothyroidism 10/10/2013   Microalbuminuria 10/10/2013   Hyperlipidemia 10/10/2013   B12 deficiency 10/10/2013   Diabetes ([place]) 07/04/2013   Environmental allergies 07/04/2013    ONSET DATE:  2-3 years  REFERRING DIAG: Neuropathy  THERAPY DIAG:  Unsteadiness on feet  Difficulty in walking, not elsewhere classified  Abnormality of gait and mobility  Muscle weakness (generalized)  Rationale for Evaluation and Treatment Rehabilitation  SUBJECTIVE:                                                                                                                                                                                               SUBJECTIVE STATEMENT:  Patient reports they had a relaxing weekend and reports no recent falls. The patient does report experiencing some pain in their (L) palm and states that when they put pressure through it they experience a bruising pain feeling. Patient denies numbness and tingling and reports (L) palm pain around a 5-6/10 on the NPRS. Patient reports they have had some difficulty turning to the left with a shopping cart when they go to [place].   Pt accompanied by: self  PERTINENT HISTORY: Patient presents to physical therapy for neuropathy. He underwent banding of his L UE fistula on 05/13/22 and scheduled for additional banding on 06/07/22. 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 Patient reports his balance is very unsteady due to neuropathy in feet, feels weak in LE's.   PAIN:  Are you having pain? Yes: NPRS scale: 5/10 Pain location: bilateral feet Pain description: stinging tingling Aggravating factors: worse at night Relieving factors: lyrica   PRECAUTIONS: Fall  WEIGHT BEARING RESTRICTIONS Yes no lifting >5 lb in arm   FALLS: Has patient fallen in last 6 months? No  LIVING ENVIRONMENT: Lives with: lives alone Lives in: House/apartment Stairs: Yes: Internal: flight steps; on right going up Has following equipment at home: Single point cane and Walker - 2 wheeled  PLOF: Independent  PATIENT GOALS to be more steady and walk as normally as possible.   OBJECTIVE:   LOWER EXTREMITY MMT:    MMT Right Eval Left Eval  Hip flexion 4- 3+  Hip extension    Hip abduction 4- 4+  Hip adduction 4- 3+  Knee flexion 4 4-  Knee extension 4 4  Ankle dorsiflexion 2+ 2+  Ankle plantarflexion 3+ 3+  (Blank rows = not tested)  FUNCTIONAL TESTs:  5 times sit to stand: 19.6 seconds  with walking stick; one LOB 10 meter walk test: 12 seconds Berg Balance Scale: 33/56  PATIENT SURVEYS:  ABC scale 67% FOTO 53%  TODAY'S TREATMENT:   Neuro Re-ed Airex Pad: CGA with hand support when needed.  -  Static stance 30 seconds  - Static stance with horizontal head turns 10x ea way  - Static stance with vertical head looks 10x ea way  - Single leg stance (B) 30 seconds, it was harder for the patient to maintain balance on their (R) leg.  - Standing with one foot on airex pad and one foot on 6 inch step 30 seconds (B) - Static stance with dual tasking, the patient put letters into groups by color - Alternating single leg stance (B) and tandem stance while dual tasking, the patient put letters into alphabetical order   TherEx Ambulating without AD and CGA 3 laps around clinic. The patient worked on changing gait speeds and their balance/coordination when turning to their left side.   Standing strength training: (5 lb ankle weights) ; two finger support on bar - Hip extension 8x (B) - Hip abduction 8x (B) - Hip flexion marching 8x (B) - Knee flexion 8x (B) - Heel raises 12x (B)  Seated strength training: - LAQs with 5 lb weight 10x (B)   PATIENT EDUCATION: Education details: HEP, goals, POC Person educated: Patient Education method: Explanation, Demonstration, Tactile cues, Verbal cues, and Handouts Education comprehension: verbalized understanding, returned demonstration, verbal cues required, and tactile cues required   HOME EXERCISE PROGRAM: Access Code: [email or web address] URL: [email or web address] Date: 06/03/2022 Prepared by: Janna Arch  Exercises - Seated Heel Toe Raises  - 1 x daily - 7 x weekly - 2 sets - 10 reps - 5 hold - Standing Tandem Balance with Counter Support  - 1 x daily - 7 x weekly - 2 sets - 2 reps - 30 hold - Standing March with Counter Support  - 1 x daily - 7 x weekly - 2 sets - 10 reps - 5 hold  GOALS: Goals reviewed with  patient? Yes  SHORT TERM GOALS: Target date: 07/01/2022  Patient will be independent in home exercise program to improve strength/mobility for better functional independence with ADLs. Baseline:10/9; HEP given  Goal status: INITIAL    LONG TERM GOALS: Target date: 08/26/2022  Patient will increase FOTO score to equal to or greater than   63%  to demonstrate statistically significant improvement in mobility and quality of life.  Baseline: 10/9: 53%  Goal status: INITIAL  2.  Patient (< 29 years old) will complete five times sit to stand test in < 10 seconds without UE support indicating an increased LE strength and improved balance. Baseline: 10/9: 19.6 seconds with walking stick; one LOB Goal status: INITIAL  3.  Patient will increase Berg Balance score by > 45/56 to demonstrate decreased fall risk during functional activities. Baseline: 10/9: 33/56 Goal status: INITIAL  4.  Patient will increase 10 meter walk test to >1.32ms as to improve gait speed for better community ambulation and to reduce fall risk. Baseline:  Goal status: INITIAL  5.  Patient will increase ABC scale score >80% to demonstrate better functional mobility and better confidence with ADLs.  Baseline: 10/9: 67%  Goal status: INITIAL    ASSESSMENT:  CLINICAL IMPRESSION: Patient performed great with balance progressions today demonstrated increase ankle strategies and stability bilaterally. The patient continues to display excellent motivation during all therapeutic activities performed today. The patient was able to increase ankle weights from #4 last session to #5 this session during strength training exercises. The patient reported some fatigue at end of session but was happy with the effort he put forth and progress he continues to make. Patient will benefit from skilled  physical therapy to improve strength, mobility, and decrease risk for falls for improved quality of life.   OBJECTIVE IMPAIRMENTS Abnormal  gait, decreased activity tolerance, decreased balance, decreased coordination, decreased endurance, decreased mobility, difficulty walking, decreased strength, impaired flexibility, impaired sensation, improper body mechanics, and pain.   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   REHAB POTENTIAL: Good  CLINICAL DECISION MAKING: Evolving/moderate complexity  EVALUATION COMPLEXITY: Moderate  PLAN: PT FREQUENCY: 2x/week  PT DURATION: 12 weeks  PLANNED INTERVENTIONS: Therapeutic exercises, Therapeutic activity, Neuromuscular re-education, Balance training, Gait training, Patient/Family education, Self Care, Joint mobilization, Stair training, Vestibular training, Canalith repositioning, Visual/preceptual remediation/compensation, DME instructions, Dry Needling, Cognitive remediation, Spinal mobilization, Cryotherapy, Moist heat, Manual lymph drainage, Compression bandaging, Taping, Vasopneumatic device, Ultrasound, Manual therapy, and Re-evaluation  PLAN FOR NEXT SESSION: Try balance progressions with ambulation (Obstacle courses) and increase ankle weight again for progress overload progressions.   Sudie Bailey 07/01/2022, 2:51 PM  This entire session was performed under  direct supervision and direction of a licensed therapist/therapist assistant . I have personally read, edited and approve of the note as written.  Janna Arch, PT, DPT

## 2022-07-03 ENCOUNTER — Ambulatory Visit: Payer: Medicare Other

## 2022-07-03 DIAGNOSIS — R262 Difficulty in walking, not elsewhere classified: Secondary | ICD-10-CM

## 2022-07-03 DIAGNOSIS — R269 Unspecified abnormalities of gait and mobility: Secondary | ICD-10-CM

## 2022-07-03 DIAGNOSIS — R278 Other lack of coordination: Secondary | ICD-10-CM

## 2022-07-03 DIAGNOSIS — R2689 Other abnormalities of gait and mobility: Secondary | ICD-10-CM

## 2022-07-03 DIAGNOSIS — M6281 Muscle weakness (generalized): Secondary | ICD-10-CM

## 2022-07-03 DIAGNOSIS — R2681 Unsteadiness on feet: Secondary | ICD-10-CM

## 2022-07-03 NOTE — Therapy (Signed)
OUTPATIENT PHYSICAL THERAPY NEURO TREATMENT   Patient Name: Gregory Crane MRN: [id number] DOB:02-01-1971, 51 y.o., male Today's Date: 07/03/2022  PCP: [name] MD REFERRING PROVIDER: [name] MD  PT End of Session - 07/03/22 0937     Visit Number 6    Number of Visits 24    Date for PT Re-Evaluation 08/26/22    Authorization Type 6/25 visits allowed: 4/10 eval 06/03/22    PT Start Time 0934    PT Stop Time 1015    PT Time Calculation (min) 41 min    Equipment Utilized During Treatment Gait belt    Activity Tolerance Patient tolerated treatment well    Behavior During Therapy [facility] for tasks assessed/performed             Past Medical History:  Diagnosis Date   Allergy    Anemia    Bell's palsy    Diabetes mellitus without complication ([place])    diet controlled   Hypertension    Hypothyroidism    Kidney stones    Pseudotumor cerebri    Stroke [facility])    Past Surgical History:  Procedure Laterality Date   COLONOSCOPY WITH PROPOFOL N/A 03/30/2020   Procedure: COLONOSCOPY WITH PROPOFOL;  Surgeon: Lesly Rubenstein, MD;  Location: [facility] ENDOSCOPY;  Service: Endoscopy;  Laterality: N/A;   LOOP RECORDER INSERTION N/A 01/27/2018   Procedure: LOOP RECORDER INSERTION;  Surgeon: [name], MD;  Location: [place] CV LAB;  Service: Cardiovascular;  Laterality: N/A;   LUMBAR PUNCTURE     as child   NO PAST SURGERIES     TEE WITHOUT CARDIOVERSION N/A 01/07/2018   Procedure: TRANSESOPHAGEAL ECHOCARDIOGRAM (TEE);  Surgeon: Minna Merritts, MD;  Location: [facility] ORS;  Service: Cardiovascular;  Laterality: N/A;   Patient Active Problem List   Diagnosis Date Noted   Hydronephrosis, left 05/04/2022   Type II diabetes mellitus with renal manifestations ([place]) 05/04/2022   Leukocytosis 05/04/2022   Pre-op evaluation 04/24/2022   Headache 07/07/2021   Hearing loss 07/07/2021   Open wound 01/25/2021   History of colon polyps 10/15/2020   Postoperative hemorrhage  involving digestive system following digestive system procedure 09/19/2020   Acute cholecystitis without calculus 09/09/2020   Type 2 diabetes mellitus, with long-term current use of insulin ([place]) 09/09/2020   Cryptogenic stroke ([place]) 07/13/2020   History of loop recorder 07/13/2020   Weakness 06/18/2020   History of 2019 novel coronavirus disease (COVID-19) 05/20/2020   ESRD (end stage renal disease) ([place]) 04/04/2020   Pneumonia due to COVID-19 virus 04/03/2020   AKI (acute kidney injury) ([place]) 04/03/2020   Elevated troponin 04/03/2020   Acquired trigger finger 06/08/2019   Lymphedema 06/08/2019   Anemia 04/17/2019   Swelling of left lower extremity 01/10/2019   Facial droop 04/09/2018   Daytime somnolence 03/30/2018   Carotid artery disease ([place]) 02/03/2018   Intracranial vascular stenosis 09/12/2017   Cough 01/20/2017   Bell's palsy 11/10/2016   History of CVA (cerebrovascular accident) 11/10/2016   Benign localized hyperplasia of prostate with urinary obstruction 10/27/2016   History of nephrolithiasis 10/27/2016   TIA (transient ischemic attack) 10/20/2016   Near syncope 06/23/2016   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   Neuropathy 08/06/2015   Health care maintenance 08/06/2015   Essential hypertension 08/06/2015   Heme positive stool 10/10/2013   Hypothyroidism 10/10/2013   Microalbuminuria 10/10/2013   Hyperlipidemia 10/10/2013   B12 deficiency 10/10/2013   Diabetes ([place]) 07/04/2013   Environmental allergies 07/04/2013    ONSET  DATE: 2-3 years  REFERRING DIAG: Neuropathy  THERAPY DIAG:  Difficulty in walking, not elsewhere classified  Muscle weakness (generalized)  Unsteadiness on feet  Abnormality of gait and mobility  Other abnormalities of gait and mobility  Other lack of coordination  Rationale for Evaluation and Treatment Rehabilitation  SUBJECTIVE:                                                                                                                                                                                               SUBJECTIVE STATEMENT:  Pt reports he is in significant amount of pain (8/10) in the L foot around the Achille's tendon.  Pt notes he was not able to sleep really well last night and he attributes the pain to be due to the neuropathy.  Pt states normally it gets better in the morning, but not today.   Pt accompanied by: self  PERTINENT HISTORY: Patient presents to physical therapy for neuropathy. He underwent banding of his L UE fistula on 05/13/22 and scheduled for additional banding on 06/07/22. 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 Patient reports his balance is very unsteady due to neuropathy in feet, feels weak in LE's.   PAIN:  Are you having pain? Yes: NPRS scale: 98/10 Pain location: L foot Pain description: sharp  Aggravating factors: worse at night Relieving factors: lyrica   PRECAUTIONS: Fall  WEIGHT BEARING RESTRICTIONS Yes no lifting >5 lb in arm   FALLS: Has patient fallen in last 6 months? No  LIVING ENVIRONMENT: Lives with: lives alone Lives in: House/apartment Stairs: Yes: Internal: flight steps; on right going up Has following equipment at home: Single point cane and Walker - 2 wheeled  PLOF: Independent  PATIENT GOALS to be more steady and walk as normally as possible.   OBJECTIVE:   LOWER EXTREMITY MMT:    MMT Right Eval Left Eval  Hip flexion 4- 3+  Hip extension    Hip abduction 4- 4+  Hip adduction 4- 3+  Knee flexion 4 4-  Knee extension 4 4  Ankle dorsiflexion 2+ 2+  Ankle plantarflexion 3+ 3+  (Blank rows = not tested)  FUNCTIONAL TESTs:  5 times sit to stand: 19.6 seconds with walking stick; one LOB 10 meter walk test: 12 seconds Berg Balance Scale: 33/56  PATIENT  SURVEYS:  ABC scale 67% FOTO 53%  TODAY'S TREATMENT:   Neuro Re-ed Airex Pad: CGA with hand support when needed.  Static stance 30 seconds  Static stance with horizontal head turns 10x ea way  Static stance with vertical head looks 10x2 ea way pt notes he has more difficulty with vertical than horizontal Single leg stance, 30 sec bouts, pt still noting that R LE is more difficult. Dynanamic balloon taps while standing on the Airex pad, UE support required at times to prevent uncontrolled descent.   TherEx  Ambulating without AD and CGA 3 laps around clinic. The patient worked on changing gait speeds and their balance/coordination when turning to their left side.   Standing strength training: (5 lb ankle weights) ; two finger support on bar Hip extension x10, each LE Hip abduction x10, each LE Hip flexion marching x10, each LE Knee flexion x10, each LE Heel raises x15  Seated strength training: LAQs, 5# weight, each LE  PATIENT EDUCATION: Education details: HEP, goals, POC Person educated: Patient Education method: Explanation, Demonstration, Tactile cues, Verbal cues, and Handouts Education comprehension: verbalized understanding, returned demonstration, verbal cues required, and tactile cues required   HOME EXERCISE PROGRAM: Access Code: [email or web address] URL: [email or web address] Date: 06/03/2022 Prepared by: Janna Arch  Exercises - Seated Heel Toe Raises  - 1 x daily - 7 x weekly - 2 sets - 10 reps - 5 hold - Standing Tandem Balance with Counter Support  - 1 x daily - 7 x weekly - 2 sets - 2 reps - 30 hold - Standing March with Counter Support  - 1 x daily - 7 x weekly - 2 sets - 10 reps - 5 hold  GOALS: Goals reviewed with patient? Yes  SHORT TERM GOALS: Target date: 07/01/2022  Patient will be independent in home exercise program to improve strength/mobility for better functional independence with ADLs. Baseline:10/9; HEP given  Goal status:  INITIAL    LONG TERM GOALS: Target date: 08/26/2022  Patient will increase FOTO score to equal to or greater than   63%  to demonstrate statistically significant improvement in mobility and quality of life.  Baseline: 10/9: 53%  Goal status: INITIAL  2.  Patient (< 37 years old) will complete five times sit to stand test in < 10 seconds without UE support indicating an increased LE strength and improved balance. Baseline: 10/9: 19.6 seconds with walking stick; one LOB Goal status: INITIAL  3.  Patient will increase Berg Balance score by > 45/56 to demonstrate decreased fall risk during functional activities. Baseline: 10/9: 33/56 Goal status: INITIAL  4.  Patient will increase 10 meter walk test to >1.55ms as to improve gait speed for better community ambulation and to reduce fall risk. Baseline:  Goal status: INITIAL  5.  Patient will increase ABC scale score >80% to demonstrate better functional mobility and better confidence with ADLs.  Baseline: 10/9: 67%  Goal status: INITIAL    ASSESSMENT:  CLINICAL IMPRESSION: Pt continues to put forth great effort throughout the visit even when pt's L ankle is in pain.  Exercise seems to reduce the pain until at rest and then it starts back, but overall pt notes a reduction in the pain.  Pt will continue to focus on tasks that challenge his balance while also strengthening the LE's going forward with therapy.  Pt will continue to benefit from skilled therapy to address remaining deficits in order to improve overall QoL and return to PLOF.     OBJECTIVE IMPAIRMENTS Abnormal gait, decreased activity tolerance, decreased balance, decreased coordination, decreased endurance, decreased mobility, difficulty walking, decreased strength, impaired flexibility, impaired sensation, improper body mechanics, and pain.  ACTIVITY LIMITATIONS carrying, lifting, bending, standing, squatting, sleeping, stairs, transfers, bed mobility, bathing, toileting,  dressing, locomotion level, and caring for others  PARTICIPATION LIMITATIONS: meal prep, cleaning, laundry, medication management, personal finances, interpersonal relationship, driving, shopping, community activity, and yard work  PERSONAL FACTORS Age, Fitness, Past/current experiences, Time since onset of injury/illness/exacerbation, Transportation, and 3+ comorbidities: CAD, cryptogenic stroke (2021), deafness in R ear, diabetic retinopathy, dialysis, ESRD, COVID, HTN, hypothyroidism, leukocytosis, nephrolithiasis, neuropathy, Pseudotumor cerebri 1986, sepsis, DM type I, DM type II  are also affecting patient's functional outcome.   REHAB POTENTIAL: Good  CLINICAL DECISION MAKING: Evolving/moderate complexity  EVALUATION COMPLEXITY: Moderate  PLAN: PT FREQUENCY: 2x/week  PT DURATION: 12 weeks  PLANNED INTERVENTIONS: Therapeutic exercises, Therapeutic activity, Neuromuscular re-education, Balance training, Gait training, Patient/Family education, Self Care, Joint mobilization, Stair training, Vestibular training, Canalith repositioning, Visual/preceptual remediation/compensation, DME instructions, Dry Needling, Cognitive remediation, Spinal mobilization, Cryotherapy, Moist heat, Manual lymph drainage, Compression bandaging, Taping, Vasopneumatic device, Ultrasound, Manual therapy, and Re-evaluation  PLAN FOR NEXT SESSION: Try balance progressions with ambulation (Obstacle courses) and increase ankle weight again for progress overload progressions.    Gwenlyn Saran, PT, DPT Physical Therapist- [facility]  07/03/22, 11:49 AM

## 2022-07-08 ENCOUNTER — Telehealth: Payer: Self-pay

## 2022-07-08 ENCOUNTER — Ambulatory Visit: Payer: Medicare Other

## 2022-07-08 NOTE — Telephone Encounter (Signed)
Patient called due to no show at appointment. Voicemail left detailing next appointment.    Janna Arch, PT, DPT

## 2022-07-08 NOTE — Therapy (Incomplete)
OUTPATIENT PHYSICAL THERAPY NEURO TREATMENT   Patient Name: Gregory Crane MRN: [id number] DOB:[birth year], 51 y.o., male Today's Date: 07/08/2022  PCP: [name] MD REFERRING PROVIDER: [name] MD    Past Medical History:  Diagnosis Date   Allergy    Anemia    Bell's palsy    Diabetes mellitus without complication ([place])    diet controlled   Hypertension    Hypothyroidism    Kidney stones    Pseudotumor cerebri    Stroke [facility])    Past Surgical History:  Procedure Laterality Date   COLONOSCOPY WITH PROPOFOL N/A 03/30/2020   Procedure: COLONOSCOPY WITH PROPOFOL;  Surgeon: Lesly Rubenstein, MD;  Location: [facility] ENDOSCOPY;  Service: Endoscopy;  Laterality: N/A;   LOOP RECORDER INSERTION N/A 01/27/2018   Procedure: LOOP RECORDER INSERTION;  Surgeon: [name], MD;  Location: [facility];  Service: Cardiovascular;  Laterality: N/A;   LUMBAR PUNCTURE     as child   NO PAST SURGERIES     TEE WITHOUT CARDIOVERSION N/A 01/07/2018   Procedure: TRANSESOPHAGEAL ECHOCARDIOGRAM (TEE);  Surgeon: Minna Merritts, MD;  Location: [facility] ORS;  Service: Cardiovascular;  Laterality: N/A;   Patient Active Problem List   Diagnosis Date Noted   Hydronephrosis, left 05/04/2022   Type II diabetes mellitus with renal manifestations ([place]) 05/04/2022   Leukocytosis 05/04/2022   Pre-op evaluation 04/24/2022   Headache 07/07/2021   Hearing loss 07/07/2021   Open wound 01/25/2021   History of colon polyps 10/15/2020   Postoperative hemorrhage involving digestive system following digestive system procedure 09/19/2020   Acute cholecystitis without calculus 09/09/2020   Type 2 diabetes mellitus, with long-term current use of insulin ([place]) 09/09/2020   Cryptogenic stroke ([place]) 07/13/2020   History of loop recorder 07/13/2020   Weakness 06/18/2020   History of 2019 novel coronavirus disease (COVID-19) 05/20/2020   ESRD (end stage renal disease) ([place]) 04/04/2020   Pneumonia  due to COVID-19 virus 04/03/2020   AKI (acute kidney injury) ([place]) 04/03/2020   Elevated troponin 04/03/2020   Acquired trigger finger 06/08/2019   Lymphedema 06/08/2019   Anemia 04/17/2019   Swelling of left lower extremity 01/10/2019   Facial droop 04/09/2018   Daytime somnolence 03/30/2018   Carotid artery disease ([place]) 02/03/2018   Intracranial vascular stenosis 09/12/2017   Cough 01/20/2017   Bell's palsy 11/10/2016   History of CVA (cerebrovascular accident) 11/10/2016   Benign localized hyperplasia of prostate with urinary obstruction 10/27/2016   History of nephrolithiasis 10/27/2016   TIA (transient ischemic attack) 10/20/2016   Near syncope 06/23/2016   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   Neuropathy 08/06/2015   Health care maintenance 08/06/2015   Essential hypertension 08/06/2015   Heme positive stool 10/10/2013   Hypothyroidism 10/10/2013   Microalbuminuria 10/10/2013   Hyperlipidemia 10/10/2013   B12 deficiency 10/10/2013   Diabetes ([place]) 07/04/2013   Environmental allergies 07/04/2013    ONSET DATE: 2-3 years  REFERRING DIAG: Neuropathy  THERAPY DIAG:  No diagnosis found.  Rationale for Evaluation and Treatment Rehabilitation  SUBJECTIVE:  SUBJECTIVE STATEMENT:  ***  Pt reports he is in significant amount of pain (8/10) in the L foot around the Achille's tendon.  Pt notes he was not able to sleep really well last night and he attributes the pain to be due to the neuropathy.  Pt states normally it gets better in the morning, but not today.   Pt accompanied by: self  PERTINENT HISTORY: Patient presents to physical therapy for neuropathy. He underwent banding of his L UE fistula on 05/13/22 and scheduled for additional banding on 06/07/22. 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 Patient reports his balance is very unsteady due to neuropathy in feet, feels weak in LE's.   PAIN:  Are you having pain? Yes: NPRS scale: 98/10 Pain location: L foot Pain description: sharp  Aggravating factors: worse at night Relieving factors: lyrica   PRECAUTIONS: Fall  WEIGHT BEARING RESTRICTIONS Yes no lifting >5 lb in arm   FALLS: Has patient fallen in last 6 months? No  LIVING ENVIRONMENT: Lives with: lives alone Lives in: House/apartment Stairs: Yes: Internal: flight steps; on right going up Has following equipment at home: Single point cane and Walker - 2 wheeled  PLOF: Independent  PATIENT GOALS to be more steady and walk as normally as possible.   OBJECTIVE:   LOWER EXTREMITY MMT:    MMT Right Eval Left Eval  Hip flexion 4- 3+  Hip extension    Hip abduction 4- 4+  Hip adduction 4- 3+  Knee flexion 4 4-  Knee extension 4 4  Ankle dorsiflexion 2+ 2+  Ankle plantarflexion 3+ 3+  (Blank rows = not tested)  FUNCTIONAL TESTs:  5 times sit to stand: 19.6 seconds with walking stick; one LOB 10 meter walk test: 12 seconds Berg Balance Scale: 33/56  PATIENT SURVEYS:  ABC scale 67% FOTO 53%  TODAY'S TREATMENT:   Neuro Re-ed  Ambulating without AD and CGA 3 laps around clinic. The patient worked on Personnel officer in conjunction with visual scanning and dual tasking  -1x lap alternating horizontal and vertical head turns. -1x lap while dual tasking (Name every animal that start with specific letters)  In // bars: (BUE, SUE, or no support) Balance beam:  - Heel to Toe Walking 4xlaps  - Lateral side stepping 4x laps. - Forward and backwards walking 4xlaps - Walking regular on balance beam stepping over orange hurdles 4xlaps  Obstacle course around therapy  gym  TherEx  Standing strength training: (7.5 lb ankle weights) ; two finger support on bar Hip extension x10, each LE Hip abduction x10, each LE Hip flexion marching x10, each LE Knee flexion x10, each LE Heel raises x15  Seated strength training: LAQs, 7.5# weight, each LE  PATIENT EDUCATION: Education details: HEP, goals, POC Person educated: Patient Education method: Explanation, Demonstration, Tactile cues, Verbal cues, and Handouts Education comprehension: verbalized understanding, returned demonstration, verbal cues required, and tactile cues required   HOME EXERCISE PROGRAM: Access Code: [email or web address] URL: [email or web address] Date: 06/03/2022 Prepared by: Janna Arch  Exercises - Seated Heel Toe Raises  - 1 x daily - 7 x weekly - 2 sets - 10 reps - 5 hold - Standing Tandem Balance with Counter Support  - 1 x daily - 7 x weekly - 2 sets - 2 reps - 30 hold - Standing March with Counter Support  - 1 x  daily - 7 x weekly - 2 sets - 10 reps - 5 hold  GOALS: Goals reviewed with patient? Yes  SHORT TERM GOALS: Target date: 07/01/2022  Patient will be independent in home exercise program to improve strength/mobility for better functional independence with ADLs. Baseline:10/9; HEP given  Goal status: INITIAL    LONG TERM GOALS: Target date: 08/26/2022  Patient will increase FOTO score to equal to or greater than   63%  to demonstrate statistically significant improvement in mobility and quality of life.  Baseline: 10/9: 53%  Goal status: INITIAL  2.  Patient (< 28 years old) will complete five times sit to stand test in < 10 seconds without UE support indicating an increased LE strength and improved balance. Baseline: 10/9: 19.6 seconds with walking stick; one LOB Goal status: INITIAL  3.  Patient will increase Berg Balance score by > 45/56 to demonstrate decreased fall risk during functional activities. Baseline: 10/9: 33/56 Goal status: INITIAL  4.   Patient will increase 10 meter walk test to >1.71ms as to improve gait speed for better community ambulation and to reduce fall risk. Baseline:  Goal status: INITIAL  5.  Patient will increase ABC scale score >80% to demonstrate better functional mobility and better confidence with ADLs.  Baseline: 10/9: 67%  Goal status: INITIAL    ASSESSMENT:  CLINICAL IMPRESSION: ***   Pt continues to put forth great effort throughout the visit even when pt's L ankle is in pain.  Exercise seems to reduce the pain until at rest and then it starts back, but overall pt notes a reduction in the pain.  Pt will continue to focus on tasks that challenge his balance while also strengthening the LE's going forward with therapy.  Pt will continue to benefit from skilled therapy to address remaining deficits in order to improve overall QoL and return to PLOF.     OBJECTIVE IMPAIRMENTS Abnormal gait, decreased activity tolerance, decreased balance, decreased coordination, decreased endurance, decreased mobility, difficulty walking, decreased strength, impaired flexibility, impaired sensation, improper body mechanics, and pain.   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   REHAB POTENTIAL:  Good  CLINICAL DECISION MAKING: Evolving/moderate complexity  EVALUATION COMPLEXITY: Moderate  PLAN: PT FREQUENCY: 2x/week  PT DURATION: 12 weeks  PLANNED INTERVENTIONS: Therapeutic exercises, Therapeutic activity, Neuromuscular re-education, Balance training, Gait training, Patient/Family education, Self Care, Joint mobilization, Stair training, Vestibular training, Canalith repositioning, Visual/preceptual remediation/compensation, DME instructions, Dry Needling, Cognitive remediation, Spinal mobilization, Cryotherapy, Moist heat, Manual lymph drainage, Compression bandaging, Taping, Vasopneumatic device, Ultrasound, Manual therapy, and Re-evaluation  PLAN FOR NEXT SESSION: Try balance progressions with ambulation (Obstacle courses) and increase ankle weight again for progressive overload progressions.   PSudie BaileySSweeny Community Hospital 07/08/22, 7:20 AM

## 2022-07-10 ENCOUNTER — Ambulatory Visit: Payer: Medicare Other

## 2022-07-10 DIAGNOSIS — R262 Difficulty in walking, not elsewhere classified: Secondary | ICD-10-CM

## 2022-07-10 DIAGNOSIS — R269 Unspecified abnormalities of gait and mobility: Secondary | ICD-10-CM

## 2022-07-10 DIAGNOSIS — M6281 Muscle weakness (generalized): Secondary | ICD-10-CM

## 2022-07-10 DIAGNOSIS — R2689 Other abnormalities of gait and mobility: Secondary | ICD-10-CM

## 2022-07-10 DIAGNOSIS — R2681 Unsteadiness on feet: Secondary | ICD-10-CM

## 2022-07-10 NOTE — Therapy (Signed)
OUTPATIENT PHYSICAL THERAPY NEURO TREATMENT   Patient Name: Gregory Crane MRN: [id number] DOB:Jan 01, 1971, 51 y.o., male Today's Date: 07/10/2022  PCP: [name] MD REFERRING PROVIDER: [name] MD  PT End of Session - 07/10/22 1022     Visit Number 7    Number of Visits 24    Date for PT Re-Evaluation 08/26/22    Authorization Type Medicare A&B; Medicaid; 06/03/22-08/26/22    Authorization Time Period 06/24/22-12/121/23 for 12 visits    Authorization - Visit Number 2    Authorization - Number of Visits 12    Progress Note Due on Visit 10    PT Start Time 1020    PT Stop Time 1058    PT Time Calculation (min) 38 min    Equipment Utilized During Treatment Gait belt    Activity Tolerance Patient tolerated treatment well;No increased pain;Patient limited by fatigue    Behavior During Therapy [facility] for tasks assessed/performed             Past Medical History:  Diagnosis Date   Allergy    Anemia    Bell's palsy    Diabetes mellitus without complication ([place])    diet controlled   Hypertension    Hypothyroidism    Kidney stones    Pseudotumor cerebri    Stroke [facility])    Past Surgical History:  Procedure Laterality Date   COLONOSCOPY WITH PROPOFOL N/A 03/30/2020   Procedure: COLONOSCOPY WITH PROPOFOL;  Surgeon: Lesly Rubenstein, MD;  Location: [facility] ENDOSCOPY;  Service: Endoscopy;  Laterality: N/A;   LOOP RECORDER INSERTION N/A 01/27/2018   Procedure: LOOP RECORDER INSERTION;  Surgeon: [name], MD;  Location: [place];  Service: Cardiovascular;  Laterality: N/A;   LUMBAR PUNCTURE     as child   NO PAST SURGERIES     TEE WITHOUT CARDIOVERSION N/A 01/07/2018   Procedure: TRANSESOPHAGEAL ECHOCARDIOGRAM (TEE);  Surgeon: Minna Merritts, MD;  Location: [facility];  Service: Cardiovascular;  Laterality: N/A;   Patient Active Problem List   Diagnosis Date Noted   Hydronephrosis, left 05/04/2022   Type II diabetes mellitus with renal  manifestations ([place]) 05/04/2022   Leukocytosis 05/04/2022   Pre-op evaluation 04/24/2022   Headache 07/07/2021   Hearing loss 07/07/2021   Open wound 01/25/2021   History of colon polyps 10/15/2020   Postoperative hemorrhage involving digestive system following digestive system procedure 09/19/2020   Acute cholecystitis without calculus 09/09/2020   Type 2 diabetes mellitus, with long-term current use of insulin ([place]) 09/09/2020   Cryptogenic stroke ([place]) 07/13/2020   History of loop recorder 07/13/2020   Weakness 06/18/2020   History of 2019 novel coronavirus disease (COVID-19) 05/20/2020   ESRD (end stage renal disease) ([place]) 04/04/2020   Pneumonia due to COVID-19 virus 04/03/2020   AKI (acute kidney injury) ([place]) 04/03/2020   Elevated troponin 04/03/2020   Acquired trigger finger 06/08/2019   Lymphedema 06/08/2019   Anemia 04/17/2019   Swelling of left lower extremity 01/10/2019   Facial droop 04/09/2018   Daytime somnolence 03/30/2018   Carotid artery disease ([place]) 02/03/2018   Intracranial vascular stenosis 09/12/2017   Cough 01/20/2017   Bell's palsy 11/10/2016   History of CVA (cerebrovascular accident) 11/10/2016   Benign localized hyperplasia of prostate with urinary obstruction 10/27/2016   History of nephrolithiasis 10/27/2016   TIA (transient ischemic attack) 10/20/2016   Near syncope 06/23/2016   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   Neuropathy 08/06/2015   Health care maintenance 08/06/2015   Essential  hypertension 08/06/2015   Heme positive stool 10/10/2013   Hypothyroidism 10/10/2013   Microalbuminuria 10/10/2013   Hyperlipidemia 10/10/2013   B12 deficiency 10/10/2013   Diabetes ([place]) 07/04/2013   Environmental allergies 07/04/2013    ONSET DATE: 2-3 years  REFERRING DIAG: Neuropathy  THERAPY DIAG:  Difficulty in walking, not elsewhere classified  Muscle weakness (generalized)  Unsteadiness on feet  Abnormality of gait and mobility  Other  abnormalities of gait and mobility  Rationale for Evaluation and Treatment Rehabilitation  SUBJECTIVE:                                                                                                                                                                                              SUBJECTIVE STATEMENT: No updates today, pt doing well in general. Pain resolved since last session. Pt forgot about Monday session.    Pt accompanied by: self  PERTINENT HISTORY: Patient presents to physical therapy for neuropathy. He underwent banding of his L UE fistula on 05/13/22 and scheduled for additional banding on 06/07/22. 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 Patient reports his balance is very unsteady due to neuropathy in feet, feels weak in LE's.   PAIN:  Are you having pain? No   PRECAUTIONS: Fall   PATIENT GOALS to be more steady and walk as normally as possible.   OBJECTIVE:   TODAY'S TREATMENT:  -Stair climibing 2 rails forward: 1x24 *sit recovery -Stair climibing 2 rails forward: 1x24 *sit recovery -high step strengthening step up/down RLE only, BUE supported 1x6 (near failure)  *sit recovery -STS from chair+airex 1x10 (looks well controlled), No LOB  *sit recovery -STS from chair+airex 1x10 c random lateral BTB perturbation (1 LOB backwards)  *sit recovery  -walking unsteadiness activity course 1: over falls mat, over R/L rocker board (1"), avoiding 2 horizontal noodles, single step on 1/2 roll, then circle round chair and return 6x total  -walking unsteadiness activity course 2: onto falls mat, 2 hedgehog steps right, to floor, A/P 1" rocker board, across red mat, 180 degree turnaround, red mat, A/P rocker board, up falls mat c 2 left hedgehog steps, down to floor. 3x total       PATIENT EDUCATION: Education details: HEP, goals, POC Person educated: Patient Education method: Explanation, Demonstration, Tactile cues, Verbal cues, and Handouts Education comprehension: verbalized understanding, returned demonstration, verbal cues required, and tactile cues required   HOME EXERCISE PROGRAM: Access Code: [email or web address] URL: [email or web address] Date: 06/03/2022 Prepared by: Janna Arch  Exercises -  Seated Heel Toe Raises  - 1 x daily - 7 x weekly - 2 sets - 10 reps - 5 hold - Standing Tandem Balance with Counter Support  - 1 x daily - 7 x weekly - 2 sets - 2 reps - 30 hold - Standing March with Counter Support  - 1 x daily - 7 x weekly - 2 sets - 10 reps - 5 hold  GOALS: Goals reviewed with patient? Yes  SHORT TERM GOALS: Target date: 07/01/2022  Patient will be independent in home exercise program to improve strength/mobility for better functional independence with ADLs. Baseline:10/9; HEP given  Goal status: INITIAL    LONG TERM GOALS: Target date: 08/26/2022  Patient will increase FOTO score to equal to or greater than   63%  to demonstrate statistically significant improvement in mobility and quality of life.  Baseline: 10/9: 53%  Goal status: INITIAL  2.  Patient (< 3 years old) will complete five times sit to stand test in < 10 seconds without UE support indicating an increased LE strength and improved balance. Baseline: 10/9: 19.6 seconds with walking stick; one LOB Goal status: INITIAL  3.  Patient will increase Berg Balance score by > 45/56 to demonstrate decreased fall risk during functional activities. Baseline: 10/9: 33/56 Goal status: INITIAL  4.  Patient will increase 10 meter walk test to >1.47ms as to improve gait speed for better community ambulation and to reduce fall risk. Baseline:  Goal status: INITIAL  5.  Patient will increase ABC scale score >80% to demonstrate better functional mobility and better confidence  with ADLs.  Baseline: 10/9: 67%  Goal status: INITIAL    ASSESSMENT:  CLINICAL IMPRESSION: Still working hard at gross motor control in overground AMB. Lots of stairs in session as this is an area of difficulty at home still. Used gait based dynamic stepping exercise to achieve improved overground AMB tolerance and variable surface management. Noted excellent pt safety awareness, problem solving, use of walking staff for balance. Pt will continue to benefit from skilled therapy to address remaining deficits in order to improve overall QoL and return to PLOF.     OBJECTIVE IMPAIRMENTS Abnormal gait, decreased activity tolerance, decreased balance, decreased coordination, decreased endurance, decreased mobility, difficulty walking, decreased strength, impaired flexibility, impaired sensation, improper body mechanics, and pain.   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   REHAB POTENTIAL: Good  CLINICAL DECISION MAKING: Evolving/moderate complexity  EVALUATION COMPLEXITY: Moderate  PLAN: PT FREQUENCY: 2x/week  PT DURATION: 12 weeks  PLANNED INTERVENTIONS: Therapeutic exercises, Therapeutic activity, Neuromuscular re-education, Balance training, Gait training,  Patient/Family education, Self Care, Joint mobilization, Stair training, Vestibular training, Canalith repositioning, Visual/preceptual remediation/compensation, DME instructions, Dry Needling, Cognitive remediation, Spinal mobilization, Cryotherapy, Moist heat, Manual lymph drainage, Compression bandaging, Taping, Vasopneumatic device, Ultrasound, Manual therapy, and Re-evaluation  PLAN FOR NEXT SESSION: Try balance progressions with ambulation (Obstacle courses) and increase ankle weight again for progress overload progressions.   10:35 AM, 07/10/22 AEtta Grandchild PT, DPT  Physical Therapist - [place] [phone]     07/10/22, 10:35 AM

## 2022-07-15 ENCOUNTER — Ambulatory Visit: Payer: Medicare Other

## 2022-07-15 NOTE — Therapy (Incomplete)
OUTPATIENT PHYSICAL THERAPY NEURO TREATMENT   Patient Name: Gregory Crane MRN: [id number] DOB:01/13/1971, 51 y.o., male Today's Date: 07/15/2022  PCP: [name] MD REFERRING PROVIDER: [name] MD    Past Medical History:  Diagnosis Date   Allergy    Anemia    Bell's palsy    Diabetes mellitus without complication ([place])    diet controlled   Hypertension    Hypothyroidism    Kidney stones    Pseudotumor cerebri    Stroke [facility])    Past Surgical History:  Procedure Laterality Date   COLONOSCOPY WITH PROPOFOL N/A 03/30/2020   Procedure: COLONOSCOPY WITH PROPOFOL;  Surgeon: Lesly Rubenstein, MD;  Location: [facility] ENDOSCOPY;  Service: Endoscopy;  Laterality: N/A;   LOOP RECORDER INSERTION N/A 01/27/2018   Procedure: LOOP RECORDER INSERTION;  Surgeon: [name], MD;  Location: [place] CV LAB;  Service: Cardiovascular;  Laterality: N/A;   LUMBAR PUNCTURE     as child   NO PAST SURGERIES     TEE WITHOUT CARDIOVERSION N/A 01/07/2018   Procedure: TRANSESOPHAGEAL ECHOCARDIOGRAM (TEE);  Surgeon: Minna Merritts, MD;  Location: [facility] ORS;  Service: Cardiovascular;  Laterality: N/A;   Patient Active Problem List   Diagnosis Date Noted   Hydronephrosis, left 05/04/2022   Type II diabetes mellitus with renal manifestations ([place]) 05/04/2022   Leukocytosis 05/04/2022   Pre-op evaluation 04/24/2022   Headache 07/07/2021   Hearing loss 07/07/2021   Open wound 01/25/2021   History of colon polyps 10/15/2020   Postoperative hemorrhage involving digestive system following digestive system procedure 09/19/2020   Acute cholecystitis without calculus 09/09/2020   Type 2 diabetes mellitus, with long-term current use of insulin ([place]) 09/09/2020   Cryptogenic stroke ([place]) 07/13/2020   History of loop recorder 07/13/2020   Weakness 06/18/2020   History of 2019 novel coronavirus disease (COVID-19) 05/20/2020   ESRD (end stage renal disease) ([place]) 04/04/2020   Pneumonia  due to COVID-19 virus 04/03/2020   AKI (acute kidney injury) ([place]) 04/03/2020   Elevated troponin 04/03/2020   Acquired trigger finger 06/08/2019   Lymphedema 06/08/2019   Anemia 04/17/2019   Swelling of left lower extremity 01/10/2019   Facial droop 04/09/2018   Daytime somnolence 03/30/2018   Carotid artery disease ([place]) 02/03/2018   Intracranial vascular stenosis 09/12/2017   Cough 01/20/2017   Bell's palsy 11/10/2016   History of CVA (cerebrovascular accident) 11/10/2016   Benign localized hyperplasia of prostate with urinary obstruction 10/27/2016   History of nephrolithiasis 10/27/2016   TIA (transient ischemic attack) 10/20/2016   Near syncope 06/23/2016   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   Neuropathy 08/06/2015   Health care maintenance 08/06/2015   Essential hypertension 08/06/2015   Heme positive stool 10/10/2013   Hypothyroidism 10/10/2013   Microalbuminuria 10/10/2013   Hyperlipidemia 10/10/2013   B12 deficiency 10/10/2013   Diabetes ([place]) 07/04/2013   Environmental allergies 07/04/2013    ONSET DATE: 2-3 years  REFERRING DIAG: Neuropathy  THERAPY DIAG:  No diagnosis found.  Rationale for Evaluation and Treatment Rehabilitation  SUBJECTIVE:  SUBJECTIVE STATEMENT:  ***  Pt reports he is in significant amount of pain (8/10) in the L foot around the Achille's tendon.  Pt notes he was not able to sleep really well last night and he attributes the pain to be due to the neuropathy.  Pt states normally it gets better in the morning, but not today.   Pt accompanied by: self  PERTINENT HISTORY: Patient presents to physical therapy for neuropathy. He underwent banding of his L UE fistula on 05/13/22 and scheduled for additional banding on 06/07/22. 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 Patient reports his balance is very unsteady due to neuropathy in feet, feels weak in LE's.   PAIN:  Are you having pain? Yes: NPRS scale: 98/10 Pain location: L foot Pain description: sharp  Aggravating factors: worse at night Relieving factors: lyrica   PRECAUTIONS: Fall  WEIGHT BEARING RESTRICTIONS Yes no lifting >5 lb in arm   FALLS: Has patient fallen in last 6 months? No  LIVING ENVIRONMENT: Lives with: lives alone Lives in: House/apartment Stairs: Yes: Internal: flight steps; on right going up Has following equipment at home: Single point cane and Walker - 2 wheeled  PLOF: Independent  PATIENT GOALS to be more steady and walk as normally as possible.   OBJECTIVE:   LOWER EXTREMITY MMT:    MMT Right Eval Left Eval  Hip flexion 4- 3+  Hip extension    Hip abduction 4- 4+  Hip adduction 4- 3+  Knee flexion 4 4-  Knee extension 4 4  Ankle dorsiflexion 2+ 2+  Ankle plantarflexion 3+ 3+  (Blank rows = not tested)  FUNCTIONAL TESTs:  5 times sit to stand: 19.6 seconds with walking stick; one LOB 10 meter walk test: 12 seconds Berg Balance Scale: 33/56  PATIENT SURVEYS:  ABC scale 67% FOTO 53%  TODAY'S TREATMENT:   Neuro Re-ed  Ambulating without AD and CGA 3 laps around clinic. The patient worked on Personnel officer in conjunction with visual scanning and dual tasking  -1x lap alternating horizontal and vertical head turns. -1x lap while dual tasking (Name every animal that start with specific letters)  In // bars: (BUE, SUE, or no support) Balance beam:  - Heel to Toe Walking 4xlaps  - Lateral side stepping 4x laps. - Forward and backwards walking 4xlaps - Walking regular on balance beam stepping over orange hurdles 4xlaps  Obstacle course around therapy  gym  TherEx  Standing strength training: (7.5 lb ankle weights) ; two finger support on bar Hip extension x10, each LE Hip abduction x10, each LE Hip flexion marching x10, each LE Knee flexion x10, each LE Heel raises x15  Seated strength training: LAQs, 7.5# weight, each LE  PATIENT EDUCATION: Education details: HEP, goals, POC Person educated: Patient Education method: Explanation, Demonstration, Tactile cues, Verbal cues, and Handouts Education comprehension: verbalized understanding, returned demonstration, verbal cues required, and tactile cues required   HOME EXERCISE PROGRAM: Access Code: [email or web address] URL: [email or web address] Date: 06/03/2022 Prepared by: Janna Arch  Exercises - Seated Heel Toe Raises  - 1 x daily - 7 x weekly - 2 sets - 10 reps - 5 hold - Standing Tandem Balance with Counter Support  - 1 x daily - 7 x weekly - 2 sets - 2 reps - 30 hold - Standing March with Counter Support  - 1 x  daily - 7 x weekly - 2 sets - 10 reps - 5 hold  GOALS: Goals reviewed with patient? Yes  SHORT TERM GOALS: Target date: 07/01/2022  Patient will be independent in home exercise program to improve strength/mobility for better functional independence with ADLs. Baseline:10/9; HEP given  Goal status: INITIAL    LONG TERM GOALS: Target date: 08/26/2022  Patient will increase FOTO score to equal to or greater than   63%  to demonstrate statistically significant improvement in mobility and quality of life.  Baseline: 10/9: 53%  Goal status: INITIAL  2.  Patient (< 85 years old) will complete five times sit to stand test in < 10 seconds without UE support indicating an increased LE strength and improved balance. Baseline: 10/9: 19.6 seconds with walking stick; one LOB Goal status: INITIAL  3.  Patient will increase Berg Balance score by > 45/56 to demonstrate decreased fall risk during functional activities. Baseline: 10/9: 33/56 Goal status: INITIAL  4.   Patient will increase 10 meter walk test to >1.40ms as to improve gait speed for better community ambulation and to reduce fall risk. Baseline:  Goal status: INITIAL  5.  Patient will increase ABC scale score >80% to demonstrate better functional mobility and better confidence with ADLs.  Baseline: 10/9: 67%  Goal status: INITIAL    ASSESSMENT:  CLINICAL IMPRESSION: ***   Pt continues to put forth great effort throughout the visit even when pt's L ankle is in pain.  Exercise seems to reduce the pain until at rest and then it starts back, but overall pt notes a reduction in the pain.  Pt will continue to focus on tasks that challenge his balance while also strengthening the LE's going forward with therapy.  Pt will continue to benefit from skilled therapy to address remaining deficits in order to improve overall QoL and return to PLOF.     OBJECTIVE IMPAIRMENTS Abnormal gait, decreased activity tolerance, decreased balance, decreased coordination, decreased endurance, decreased mobility, difficulty walking, decreased strength, impaired flexibility, impaired sensation, improper body mechanics, and pain.   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   REHAB POTENTIAL:  Good  CLINICAL DECISION MAKING: Evolving/moderate complexity  EVALUATION COMPLEXITY: Moderate  PLAN: PT FREQUENCY: 2x/week  PT DURATION: 12 weeks  PLANNED INTERVENTIONS: Therapeutic exercises, Therapeutic activity, Neuromuscular re-education, Balance training, Gait training, Patient/Family education, Self Care, Joint mobilization, Stair training, Vestibular training, Canalith repositioning, Visual/preceptual remediation/compensation, DME instructions, Dry Needling, Cognitive remediation, Spinal mobilization, Cryotherapy, Moist heat, Manual lymph drainage, Compression bandaging, Taping, Vasopneumatic device, Ultrasound, Manual therapy, and Re-evaluation  PLAN FOR NEXT SESSION: Try balance progressions with ambulation (Obstacle courses) and increase ankle weight again for progressive overload progressions.   PSudie BaileySMonroe Surgical Hospital 07/15/22, 7:26 AM

## 2022-07-17 ENCOUNTER — Ambulatory Visit: Payer: Medicare Other

## 2022-07-17 DIAGNOSIS — R2681 Unsteadiness on feet: Secondary | ICD-10-CM

## 2022-07-17 DIAGNOSIS — R269 Unspecified abnormalities of gait and mobility: Secondary | ICD-10-CM

## 2022-07-17 DIAGNOSIS — R262 Difficulty in walking, not elsewhere classified: Secondary | ICD-10-CM | POA: Diagnosis not present

## 2022-07-17 DIAGNOSIS — M6281 Muscle weakness (generalized): Secondary | ICD-10-CM

## 2022-07-17 NOTE — Therapy (Signed)
OUTPATIENT PHYSICAL THERAPY NEURO TREATMENT   Patient Name: Gregory Crane MRN: [id number] DOB:November 02, 1970, 51 y.o., male Today's Date: 07/17/2022  PCP: [name] MD REFERRING PROVIDER: [name] MD  PT End of Session - 07/17/22 0803     Visit Number 8    Number of Visits 24    Date for PT Re-Evaluation 08/26/22    Authorization Type Medicare A&B; Medicaid; 06/03/22-08/26/22    Authorization Time Period 06/24/22-12/121/23 for 12 visits    Authorization - Number of Visits 12    Progress Note Due on Visit 10    PT Start Time 0800    PT Stop Time 0845    PT Time Calculation (min) 45 min    Equipment Utilized During Treatment Gait belt    Activity Tolerance Patient tolerated treatment well;No increased pain;Patient limited by fatigue    Behavior During Therapy [facility] for tasks assessed/performed            Past Medical History:  Diagnosis Date   Allergy    Anemia    Bell's palsy    Diabetes mellitus without complication ([place])    diet controlled   Hypertension    Hypothyroidism    Kidney stones    Pseudotumor cerebri    Stroke [facility])    Past Surgical History:  Procedure Laterality Date   COLONOSCOPY WITH PROPOFOL N/A 03/30/2020   Procedure: COLONOSCOPY WITH PROPOFOL;  Surgeon: Lesly Rubenstein, MD;  Location: [facility] ENDOSCOPY;  Service: Endoscopy;  Laterality: N/A;   LOOP RECORDER INSERTION N/A 01/27/2018   Procedure: LOOP RECORDER INSERTION;  Surgeon: [name], MD;  Location: [place] CV LAB;  Service: Cardiovascular;  Laterality: N/A;   LUMBAR PUNCTURE     as child   NO PAST SURGERIES     TEE WITHOUT CARDIOVERSION N/A 01/07/2018   Procedure: TRANSESOPHAGEAL ECHOCARDIOGRAM (TEE);  Surgeon: Minna Merritts, MD;  Location: [facility] ORS;  Service: Cardiovascular;  Laterality: N/A;   Patient Active Problem List   Diagnosis Date Noted   Hydronephrosis, left 05/04/2022   Type II diabetes mellitus with renal manifestations ([place]) 05/04/2022   Leukocytosis  05/04/2022   Pre-op evaluation 04/24/2022   Headache 07/07/2021   Hearing loss 07/07/2021   Open wound 01/25/2021   History of colon polyps 10/15/2020   Postoperative hemorrhage involving digestive system following digestive system procedure 09/19/2020   Acute cholecystitis without calculus 09/09/2020   Type 2 diabetes mellitus, with long-term current use of insulin ([place]) 09/09/2020   Cryptogenic stroke ([place]) 07/13/2020   History of loop recorder 07/13/2020   Weakness 06/18/2020   History of 2019 novel coronavirus disease (COVID-19) 05/20/2020   ESRD (end stage renal disease) ([place]) 04/04/2020   Pneumonia due to COVID-19 virus 04/03/2020   AKI (acute kidney injury) ([place]) 04/03/2020   Elevated troponin 04/03/2020   Acquired trigger finger 06/08/2019   Lymphedema 06/08/2019   Anemia 04/17/2019   Swelling of left lower extremity 01/10/2019   Facial droop 04/09/2018   Daytime somnolence 03/30/2018   Carotid artery disease ([place]) 02/03/2018   Intracranial vascular stenosis 09/12/2017   Cough 01/20/2017   Bell's palsy 11/10/2016   History of CVA (cerebrovascular accident) 11/10/2016   Benign localized hyperplasia of prostate with urinary obstruction 10/27/2016   History of nephrolithiasis 10/27/2016   TIA (transient ischemic attack) 10/20/2016   Near syncope 06/23/2016   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   Neuropathy 08/06/2015   Health care maintenance 08/06/2015   Essential hypertension 08/06/2015   Heme positive stool 10/10/2013  Hypothyroidism 10/10/2013   Microalbuminuria 10/10/2013   Hyperlipidemia 10/10/2013   B12 deficiency 10/10/2013   Diabetes ([place]) 07/04/2013   Environmental allergies 07/04/2013   ONSET DATE: 2-3 years  REFERRING DIAG: Neuropathy  THERAPY DIAG:  Difficulty in walking, not elsewhere classified  Muscle weakness (generalized)  Unsteadiness on feet  Abnormality of gait and mobility  Rationale for Evaluation and Treatment  Rehabilitation  SUBJECTIVE:                                                                                                                                                                                             SUBJECTIVE STATEMENT:  Pt reports he is not experiencing any pain today but the patient does feel a little off following his dialysis treatment this week. The patient reports he will be getting another round of dialysis today.   Pt accompanied by: self  PERTINENT HISTORY: Patient presents to physical therapy for neuropathy. He underwent banding of his L UE fistula on 05/13/22 and scheduled for additional banding on 06/07/22. 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 Patient reports his balance is very unsteady due to neuropathy in feet, feels weak in LE's.   PAIN:  Are you having pain? Yes: NPRS scale: 98/10 Pain location: L foot Pain description: sharp  Aggravating factors: worse at night Relieving factors: lyrica   PRECAUTIONS: Fall  WEIGHT BEARING RESTRICTIONS Yes no lifting >5 lb in arm   FALLS: Has patient fallen in last 6 months? No  LIVING ENVIRONMENT: Lives with: lives alone Lives in: House/apartment Stairs: Yes: Internal: flight steps; on right going up Has following equipment at home: Single point cane and Walker - 2 wheeled  PLOF: Independent  PATIENT GOALS to be more steady and walk as normally as possible.   OBJECTIVE:   LOWER EXTREMITY MMT:    MMT Right Eval Left Eval  Hip flexion 4- 3+  Hip extension    Hip abduction 4- 4+  Hip adduction 4- 3+  Knee flexion 4 4-  Knee extension 4 4  Ankle dorsiflexion 2+ 2+  Ankle plantarflexion 3+ 3+  (Blank rows = not tested)  FUNCTIONAL TESTs:  5 times sit to stand: 19.6 seconds with walking stick; one LOB 10 meter  walk test: 12 seconds Berg Balance Scale: 33/56  PATIENT SURVEYS:  ABC scale 67% FOTO 53%  TODAY'S TREATMENT:   Neuro Re-ed  Ambulating without AD and CGA 6 laps around clinic. The patient worked on Personnel officer in conjunction with  visual scanning and dual tasking  - 1x lap regular walking to warm up  - 1x lap alternating horizontal and vertical head turns. - 4x lap while dual tasking  (Name every animal that start with specific letters on wall) (Name every food that start with specific letters on wall)  Obstacle course around therapy gym (4 laps)  - stepping over hurdles, walking on a soft foam pad, alternating toe taps at hedge hogs, stepping on to a 4.5 inch step and down.   TherEx  Standing strength training: (7.5 lb ankle weights) ; two finger support on bar Hip extension x8, each LE Hip abduction x8, each LE Hip flexion marching x8, each LE Knee flexion x8, each LE Sequencing movements: Heel raise, march, hip abd, knee flexion (5x)   PATIENT EDUCATION: Education details: HEP, goals, POC Person educated: Patient Education method: Explanation, Demonstration, Tactile cues, Verbal cues, and Handouts Education comprehension: verbalized understanding, returned demonstration, verbal cues required, and tactile cues required  HOME EXERCISE PROGRAM: Access Code: [email or web address] URL: [email or web address] Date: 06/03/2022 Prepared by: Janna Arch  Exercises - Seated Heel Toe Raises  - 1 x daily - 7 x weekly - 2 sets - 10 reps - 5 hold - Standing Tandem Balance with Counter Support  - 1 x daily - 7 x weekly - 2 sets - 2 reps - 30 hold - Standing March with Counter Support  - 1 x daily - 7 x weekly - 2 sets - 10 reps - 5 hold  GOALS: Goals reviewed with patient? Yes  SHORT TERM GOALS: Target date: 07/01/2022  Patient will be independent in home exercise program to improve strength/mobility for better functional independence with ADLs. Baseline:10/9; HEP given   Goal status: INITIAL  LONG TERM GOALS: Target date: 08/26/2022  Patient will increase FOTO score to equal to or greater than   63%  to demonstrate statistically significant improvement in mobility and quality of life.  Baseline: 10/9: 53%  Goal status: INITIAL  2.  Patient (< 31 years old) will complete five times sit to stand test in < 10 seconds without UE support indicating an increased LE strength and improved balance. Baseline: 10/9: 19.6 seconds with walking stick; one LOB Goal status: INITIAL  3.  Patient will increase Berg Balance score by > 45/56 to demonstrate decreased fall risk during functional activities. Baseline: 10/9: 33/56 Goal status: INITIAL  4.  Patient will increase 10 meter walk test to >1.9ms as to improve gait speed for better community ambulation and to reduce fall risk. Baseline:  Goal status: INITIAL  5.  Patient will increase ABC scale score >80% to demonstrate better functional mobility and better confidence with ADLs.  Baseline: 10/9: 67%  Goal status: INITIAL  ASSESSMENT:  CLINICAL IMPRESSION: The patient continues to put forth fantastic effort and is motivated to challenge himself with balance tasks. The patient performed well with strengthening in the parallel bars and was able perform dual tasking and obstacle course activities will CGA. The patient was fatigued following standing strengthening exercises but was able to overcome fatigue during obstacle course training. The patient was unable to perform tandem walking on the airex balance beam due to fatigue. Overall, the patient was able to participate in therapeutic exercises with no aggravation. Patient will continue to focus on tasks that challenge his balance while also strengthening the LE's going forward with therapy. Patient will continue to benefit from skilled therapy to address remaining deficits in order to improve overall QoL and return to PLOF.  OBJECTIVE IMPAIRMENTS Abnormal gait,  decreased activity tolerance, decreased balance, decreased coordination, decreased endurance, decreased mobility, difficulty walking, decreased strength, impaired flexibility, impaired sensation, improper body mechanics, and pain.   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   REHAB POTENTIAL: Good  CLINICAL DECISION MAKING: Evolving/moderate complexity  EVALUATION COMPLEXITY: Moderate  PLAN: PT FREQUENCY: 2x/week  PT DURATION: 12 weeks  PLANNED INTERVENTIONS: Therapeutic exercises, Therapeutic activity, Neuromuscular re-education, Balance training, Gait training, Patient/Family education, Self Care, Joint mobilization, Stair training, Vestibular training, Canalith repositioning, Visual/preceptual remediation/compensation, DME instructions, Dry Needling, Cognitive remediation, Spinal mobilization, Cryotherapy, Moist heat, Manual lymph drainage, Compression bandaging, Taping, Vasopneumatic device, Ultrasound, Manual therapy, and Re-evaluation  PLAN FOR NEXT SESSION: Try balance progressions with ambulation (Obstacle courses) and increase ankle weight again for progressive overload progressions.   Sudie Bailey SPT   This entire session was performed under direct supervision  and direction of a licensed therapist/therapist assistant . I have personally read, edited and approve of the note as written.  Janna Arch, PT, DPT  [facility]  07/17/22, 9:03 AM

## 2022-07-22 ENCOUNTER — Ambulatory Visit: Payer: Medicare Other

## 2022-07-22 DIAGNOSIS — R2681 Unsteadiness on feet: Secondary | ICD-10-CM

## 2022-07-22 DIAGNOSIS — R262 Difficulty in walking, not elsewhere classified: Secondary | ICD-10-CM

## 2022-07-22 DIAGNOSIS — R269 Unspecified abnormalities of gait and mobility: Secondary | ICD-10-CM

## 2022-07-22 DIAGNOSIS — M6281 Muscle weakness (generalized): Secondary | ICD-10-CM

## 2022-07-22 NOTE — Therapy (Signed)
OUTPATIENT PHYSICAL THERAPY NEURO TREATMENT   Patient Name: Gregory Crane MRN: [id number] DOB:[birth year], 51 y.o., male Today's Date: 07/22/2022  PCP: [name] MD REFERRING PROVIDER: [name] MD  PT End of Session - 07/22/22 1015     Visit Number 9    Number of Visits 24    Date for PT Re-Evaluation 08/26/22    Authorization Type Medicare A&B; Medicaid; 06/03/22-08/26/22    Authorization Time Period 06/24/22-12/121/23 for 12 visits    Authorization - Number of Visits 12    Progress Note Due on Visit 10    PT Start Time 1015    PT Stop Time 1100    PT Time Calculation (min) 45 min    Equipment Utilized During Treatment Gait belt    Activity Tolerance Patient tolerated treatment well;No increased pain;Patient limited by fatigue    Behavior During Therapy [facility] for tasks assessed/performed            Past Medical History:  Diagnosis Date   Allergy    Anemia    Bell's palsy    Diabetes mellitus without complication ([place])    diet controlled   Hypertension    Hypothyroidism    Kidney stones    Pseudotumor cerebri    Stroke [facility])    Past Surgical History:  Procedure Laterality Date   COLONOSCOPY WITH PROPOFOL N/A 03/30/2020   Procedure: COLONOSCOPY WITH PROPOFOL;  Surgeon: Lesly Rubenstein, MD;  Location: [facility] ENDOSCOPY;  Service: Endoscopy;  Laterality: N/A;   LOOP RECORDER INSERTION N/A 01/27/2018   Procedure: LOOP RECORDER INSERTION;  Surgeon: [name], MD;  Location: [place] CV LAB;  Service: Cardiovascular;  Laterality: N/A;   LUMBAR PUNCTURE     as child   NO PAST SURGERIES     TEE WITHOUT CARDIOVERSION N/A 01/07/2018   Procedure: TRANSESOPHAGEAL ECHOCARDIOGRAM (TEE);  Surgeon: Minna Merritts, MD;  Location: [facility] ORS;  Service: Cardiovascular;  Laterality: N/A;   Patient Active Problem List   Diagnosis Date Noted   Hydronephrosis, left 05/04/2022   Type II diabetes mellitus with renal manifestations ([place]) 05/04/2022   Leukocytosis  05/04/2022   Pre-op evaluation 04/24/2022   Headache 07/07/2021   Hearing loss 07/07/2021   Open wound 01/25/2021   History of colon polyps 10/15/2020   Postoperative hemorrhage involving digestive system following digestive system procedure 09/19/2020   Acute cholecystitis without calculus 09/09/2020   Type 2 diabetes mellitus, with long-term current use of insulin ([place]) 09/09/2020   Cryptogenic stroke ([place]) 07/13/2020   History of loop recorder 07/13/2020   Weakness 06/18/2020   History of 2019 novel coronavirus disease (COVID-19) 05/20/2020   ESRD (end stage renal disease) ([place]) 04/04/2020   Pneumonia due to COVID-19 virus 04/03/2020   AKI (acute kidney injury) ([place]) 04/03/2020   Elevated troponin 04/03/2020   Acquired trigger finger 06/08/2019   Lymphedema 06/08/2019   Anemia 04/17/2019   Swelling of left lower extremity 01/10/2019   Facial droop 04/09/2018   Daytime somnolence 03/30/2018   Carotid artery disease ([place]) 02/03/2018   Intracranial vascular stenosis 09/12/2017   Cough 01/20/2017   Bell's palsy 11/10/2016   History of CVA (cerebrovascular accident) 11/10/2016   Benign localized hyperplasia of prostate with urinary obstruction 10/27/2016   History of nephrolithiasis 10/27/2016   TIA (transient ischemic attack) 10/20/2016   Near syncope 06/23/2016   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   Neuropathy 08/06/2015   Health care maintenance 08/06/2015   Essential hypertension 08/06/2015   Heme positive stool 10/10/2013  Hypothyroidism 10/10/2013   Microalbuminuria 10/10/2013   Hyperlipidemia 10/10/2013   B12 deficiency 10/10/2013   Diabetes ([place]) 07/04/2013   Environmental allergies 07/04/2013   ONSET DATE: 2-3 years  REFERRING DIAG: Neuropathy  THERAPY DIAG:  Difficulty in walking, not elsewhere classified  Muscle weakness (generalized)  Unsteadiness on feet  Abnormality of gait and mobility  Rationale for Evaluation and Treatment  Rehabilitation  SUBJECTIVE:                                                                                                                                                                                             SUBJECTIVE STATEMENT: Pt reports they had a good weekend and holiday. Patient reports they are in no pain today and patient reports no falls or significant changes since last visit.   Pt accompanied by: self  PERTINENT HISTORY: Patient presents to physical therapy for neuropathy. He underwent banding of his L UE fistula on 05/13/22 and scheduled for additional banding on 06/07/22. 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 Patient reports his balance is very unsteady due to neuropathy in feet, feels weak in LE's.   PAIN:  Are you having pain? Yes: NPRS scale: 98/10 Pain location: L foot Pain description: sharp  Aggravating factors: worse at night Relieving factors: lyrica   PRECAUTIONS: Fall  WEIGHT BEARING RESTRICTIONS Yes no lifting >5 lb in arm   FALLS: Has patient fallen in last 6 months? No  LIVING ENVIRONMENT: Lives with: lives alone Lives in: House/apartment Stairs: Yes: Internal: flight steps; on right going up Has following equipment at home: Single point cane and Walker - 2 wheeled  PLOF: Independent  PATIENT GOALS to be more steady and walk as normally as possible.   OBJECTIVE:   LOWER EXTREMITY MMT:    MMT Right Eval Left Eval  Hip flexion 4- 3+  Hip extension    Hip abduction 4- 4+  Hip adduction 4- 3+  Knee flexion 4 4-  Knee extension 4 4  Ankle dorsiflexion 2+ 2+  Ankle plantarflexion 3+ 3+  (Blank rows = not tested)  FUNCTIONAL TESTs:  5 times sit to stand: 19.6 seconds with walking stick; one LOB 10 meter walk test: 12 seconds Berg Balance Scale:  33/56  PATIENT SURVEYS:  ABC scale 67% FOTO 53%  TODAY'S TREATMENT:   Neuro Re-ed Resisted Walking with Matrix Machine  - Forward and backward walking 1x17.5 lbs - Forward and backward walking 1x22.5 lbs - Forward and backward walking while having  catch with ball 1x17.5 lbs - Forward and backward walking while having catch with ball 1x22.5 lbs  - Lateral Walking 1x12.5 lbs (each side)  - Lateral Walking 1x7.5 lbs walking while having catch with ball (each side)   TherEx Supine Strengthening:  - Bridges 1x10 - Bridge with BTB Abduction 1x10 - Bridge with ball Adduction 1x10  Standing in // bars Strengthening:  - Anterior Tibialis Raises 1x10 (difficult for patient, try in seating next time)  - Squats 2x10; (Second set with patient left foot shifted back to counteract weight shift to the right)  - Heel raises 2x8   PATIENT EDUCATION: Education details: HEP, goals, POC Person educated: Patient Education method: Explanation, Demonstration, Tactile cues, Verbal cues, and Handouts Education comprehension: verbalized understanding, returned demonstration, verbal cues required, and tactile cues required  HOME EXERCISE PROGRAM: Access Code: [email or web address] URL: [email or web address] Date: 06/03/2022 Prepared by: Janna Arch  Exercises - Seated Heel Toe Raises  - 1 x daily - 7 x weekly - 2 sets - 10 reps - 5 hold - Standing Tandem Balance with Counter Support  - 1 x daily - 7 x weekly - 2 sets - 2 reps - 30 hold - Standing March with Counter Support  - 1 x daily - 7 x weekly - 2 sets - 10 reps - 5 hold  GOALS: Goals reviewed with patient? Yes  SHORT TERM GOALS: Target date: 07/01/2022  Patient will be independent in home exercise program to improve strength/mobility for better functional independence with ADLs. Baseline:10/9; HEP given  Goal status: INITIAL  LONG TERM GOALS: Target date: 08/26/2022  Patient will increase FOTO score to equal to or greater than   63%   to demonstrate statistically significant improvement in mobility and quality of life.  Baseline: 10/9: 53%  Goal status: INITIAL  2.  Patient (< 3 years old) will complete five times sit to stand test in < 10 seconds without UE support indicating an increased LE strength and improved balance. Baseline: 10/9: 19.6 seconds with walking stick; one LOB Goal status: INITIAL  3.  Patient will increase Berg Balance score by > 45/56 to demonstrate decreased fall risk during functional activities. Baseline: 10/9: 33/56 Goal status: INITIAL  4.  Patient will increase 10 meter walk test to >1.28ms as to improve gait speed for better community ambulation and to reduce fall risk. Baseline:  Goal status: INITIAL  5.  Patient will increase ABC scale score >80% to demonstrate better functional mobility and better confidence with ADLs.  Baseline: 10/9: 67%  Goal status: INITIAL  ASSESSMENT:  CLINICAL IMPRESSION: The patient continues to come to therapy very motivated to challenge himself and improve his balance. The patient performed well with resisted walking and was able to demonstrate great body control throughout. Dual tasking while performing resisted walking was difficult for the patient but improved with cues to create a wider BOS and have a small bend in the knees. The patient was displayed some fatigue following resisted walking but continues to show increase tolerance to strengthening and balance exercises. Overall, the patient to learn new exercises quickly and displayed proper form with all exercises following verbal cues and demonstration from SPT. Patient will continue to focus on tasks that challenge his balance while also strengthening the LE's going forward with therapy. Patient will continue to benefit from skilled therapy to address remaining deficits in order to improve overall QoL and return to PLOF.    OBJECTIVE IMPAIRMENTS Abnormal gait, decreased activity tolerance, decreased  balance,  decreased coordination, decreased endurance, decreased mobility, difficulty walking, decreased strength, impaired flexibility, impaired sensation, improper body mechanics, and pain.   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   REHAB POTENTIAL: Good  CLINICAL DECISION MAKING: Evolving/moderate complexity  EVALUATION COMPLEXITY: Moderate  PLAN: PT FREQUENCY: 2x/week  PT DURATION: 12 weeks  PLANNED INTERVENTIONS: Therapeutic exercises, Therapeutic activity, Neuromuscular re-education, Balance training, Gait training, Patient/Family education, Self Care, Joint mobilization, Stair training, Vestibular training, Canalith repositioning, Visual/preceptual remediation/compensation, DME instructions, Dry Needling, Cognitive remediation, Spinal mobilization, Cryotherapy, Moist heat, Manual lymph drainage, Compression bandaging, Taping, Vasopneumatic device, Ultrasound, Manual therapy, and Re-evaluation  PLAN FOR NEXT SESSION: Continue to progress dynamic balance and dual tasking balance.   Sudie Bailey SPT   This entire session was performed under direct supervision and direction of a licensed therapist/therapist assistant . I have personally read, edited and approve of  the note as written.  Janna Arch, PT, DPT  [facility]  07/22/22, 11:37 AM

## 2022-07-24 ENCOUNTER — Ambulatory Visit: Payer: Medicare Other

## 2022-07-24 ENCOUNTER — Ambulatory Visit: Payer: Medicare Other | Admitting: Nurse Practitioner

## 2022-07-24 DIAGNOSIS — R262 Difficulty in walking, not elsewhere classified: Secondary | ICD-10-CM | POA: Diagnosis not present

## 2022-07-24 DIAGNOSIS — R2681 Unsteadiness on feet: Secondary | ICD-10-CM

## 2022-07-24 DIAGNOSIS — M6281 Muscle weakness (generalized): Secondary | ICD-10-CM

## 2022-07-24 DIAGNOSIS — R278 Other lack of coordination: Secondary | ICD-10-CM

## 2022-07-24 DIAGNOSIS — R269 Unspecified abnormalities of gait and mobility: Secondary | ICD-10-CM

## 2022-07-24 NOTE — Therapy (Signed)
OUTPATIENT PHYSICAL THERAPY NEURO TREATMENT/ Physical Therapy Progress Note   Dates of reporting period  06/03/22   to   07/24/22    Patient Name: Gregory Crane MRN: [id number] DOB:10-03-1970, 51 y.o., male Today's Date: 07/24/2022  PCP: [name] MD REFERRING PROVIDER: [name] MD  PT End of Session - 07/24/22 1229     Visit Number 10    Number of Visits 24    Date for PT Re-Evaluation 08/26/22    Authorization Type Medicare A&B; Medicaid; 11/29 PN    Authorization Time Period 06/24/22-12/121/23 for 12 visits    Authorization - Number of Visits 12    Progress Note Due on Visit 10    PT Start Time 1110    PT Stop Time 1155    PT Time Calculation (min) 45 min    Equipment Utilized During Treatment Gait belt    Activity Tolerance Patient tolerated treatment well;No increased pain;Patient limited by fatigue    Behavior During Therapy [facility] for tasks assessed/performed             Past Medical History:  Diagnosis Date   Allergy    Anemia    Bell's palsy    Diabetes mellitus without complication ([place])    diet controlled   Hypertension    Hypothyroidism    Kidney stones    Pseudotumor cerebri    Stroke [facility])    Past Surgical History:  Procedure Laterality Date   COLONOSCOPY WITH PROPOFOL N/A 03/30/2020   Procedure: COLONOSCOPY WITH PROPOFOL;  Surgeon: Lesly Rubenstein, MD;  Location: [facility] ENDOSCOPY;  Service: Endoscopy;  Laterality: N/A;   LOOP RECORDER INSERTION N/A 01/27/2018   Procedure: LOOP RECORDER INSERTION;  Surgeon: [name], MD;  Location: [place] CV LAB;  Service: Cardiovascular;  Laterality: N/A;   LUMBAR PUNCTURE     as child   NO PAST SURGERIES     TEE WITHOUT CARDIOVERSION N/A 01/07/2018   Procedure: TRANSESOPHAGEAL ECHOCARDIOGRAM (TEE);  Surgeon: Minna Merritts, MD;  Location: [facility] ORS;  Service: Cardiovascular;  Laterality: N/A;   Patient Active Problem List   Diagnosis Date Noted   Hydronephrosis, left 05/04/2022    Type II diabetes mellitus with renal manifestations ([place]) 05/04/2022   Leukocytosis 05/04/2022   Pre-op evaluation 04/24/2022   Headache 07/07/2021   Hearing loss 07/07/2021   Open wound 01/25/2021   History of colon polyps 10/15/2020   Postoperative hemorrhage involving digestive system following digestive system procedure 09/19/2020   Acute cholecystitis without calculus 09/09/2020   Type 2 diabetes mellitus, with long-term current use of insulin ([place]) 09/09/2020   Cryptogenic stroke ([place]) 07/13/2020   History of loop recorder 07/13/2020   Weakness 06/18/2020   History of 2019 novel coronavirus disease (COVID-19) 05/20/2020   ESRD (end stage renal disease) ([place]) 04/04/2020   Pneumonia due to COVID-19 virus 04/03/2020   AKI (acute kidney injury) ([place]) 04/03/2020   Elevated troponin 04/03/2020   Acquired trigger finger 06/08/2019   Lymphedema 06/08/2019   Anemia 04/17/2019   Swelling of left lower extremity 01/10/2019   Facial droop 04/09/2018   Daytime somnolence 03/30/2018   Carotid artery disease ([place]) 02/03/2018   Intracranial vascular stenosis 09/12/2017   Cough 01/20/2017   Bell's palsy 11/10/2016   History of CVA (cerebrovascular accident) 11/10/2016   Benign localized hyperplasia of prostate with urinary obstruction 10/27/2016   History of nephrolithiasis 10/27/2016   TIA (transient ischemic attack) 10/20/2016   Near syncope 06/23/2016   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  Neuropathy 08/06/2015   Health care maintenance 08/06/2015   Essential hypertension 08/06/2015   Heme positive stool 10/10/2013   Hypothyroidism 10/10/2013   Microalbuminuria 10/10/2013   Hyperlipidemia 10/10/2013   B12 deficiency 10/10/2013   Diabetes ([place]) 07/04/2013   Environmental allergies 07/04/2013   ONSET DATE: 2-3 years  REFERRING DIAG: Neuropathy  THERAPY DIAG:  Difficulty in walking, not elsewhere classified  Muscle weakness (generalized)  Unsteadiness on feet  Abnormality of  gait and mobility  Other lack of coordination  Rationale for Evaluation and Treatment Rehabilitation  SUBJECTIVE:                                                                                                                                                                                             SUBJECTIVE STATEMENT: Patient reports they had dialysis yesterday and they feel a little wiped out today. Patient reports feeling around 70% of their normal selves. Patient reports they are in no pain today and patient reports no falls or significant changes since last visit.   Pt accompanied by: self  PERTINENT HISTORY: Patient presents to physical therapy for neuropathy. He underwent banding of his L UE fistula on 05/13/22 and scheduled for additional banding on 06/07/22. 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 Patient reports his balance is very unsteady due to neuropathy in feet, feels weak in LE's.   PAIN:  Are you having pain? Yes: NPRS scale: 98/10 Pain location: L foot Pain description: sharp  Aggravating factors: worse at night Relieving factors: lyrica   PRECAUTIONS: Fall  WEIGHT BEARING RESTRICTIONS Yes no lifting >5 lb in arm   FALLS: Has patient fallen in last 6 months? No  LIVING ENVIRONMENT: Lives with: lives alone Lives in: House/apartment Stairs: Yes: Internal: flight steps; on right going up Has following equipment at home: Single point cane and Walker - 2 wheeled  PLOF: Independent  PATIENT GOALS to be more steady and walk as normally as possible.   OBJECTIVE:   LOWER EXTREMITY MMT:    MMT Right Eval Left Eval  Hip flexion 4- 3+  Hip extension    Hip abduction 4- 4+  Hip adduction 4- 3+  Knee flexion 4 4-  Knee extension 4 4  Ankle dorsiflexion 2+ 2+  Ankle  plantarflexion 3+ 3+  (Blank rows = not tested)  FUNCTIONAL TESTs:  5 times sit to stand: 19.6 seconds with walking stick; one LOB 10 meter walk test: 12 seconds Berg Balance Scale: 33/56  PATIENT SURVEYS:  ABC scale 67% FOTO 53%  TODAY'S TREATMENT:  Goals performed: see below  Neuro Re-ed (In // bars)  Single leg Balance Progressions:  - SLS (BUE support) 2x30 seconds each leg  - SLS with horizontal head turns (BUE support) 2x30 seconds each leg  - SLS with eyes closed (BUE support) 2x30 seconds each leg  - SLS with (SUE support, 2 fingers on bar) 2x30 seconds each leg (*Patient was cued to regain his balance when needed by resetting and putting their leg down for a second)   TherEx Seated Strengthening:  - Hip Abduction with BTB 10x  - Anterior Tibialis Raises 1x10  - Hamstring Curls 10xea BTB - Seated PF with BTB 10x  - Seated marching with 7.5 lbs AW 10x ea - Seated Leg Extensions with 7.5 lbs AW 10x2 ea  PATIENT EDUCATION: Education details: HEP, goals, POC Person educated: Patient Education method: Explanation, Demonstration, Tactile cues, Verbal cues, and Handouts Education comprehension: verbalized understanding, returned demonstration, verbal cues required, and tactile cues required  HOME EXERCISE PROGRAM: Access Code: [email or web address] URL: [email or web address] Date: 06/03/2022 Prepared by: Janna Arch  Exercises - Seated Heel Toe Raises  - 1 x daily - 7 x weekly - 2 sets - 10 reps - 5 hold - Standing Tandem Balance with Counter Support  - 1 x daily - 7 x weekly - 2 sets - 2 reps - 30 hold - Standing March with Counter Support  - 1 x daily - 7 x weekly - 2 sets - 10 reps - 5 hold  GOALS: Goals reviewed with patient? Yes  SHORT TERM GOALS: Target date: 07/01/2022  Patient will be independent in home exercise program to improve strength/mobility for better functional independence with ADLs. Baseline:10/9; HEP given  Goal status: INITIAL  LONG  TERM GOALS: Target date: 08/26/2022  Patient will increase FOTO score to equal to or greater than   63%  to demonstrate statistically significant improvement in mobility and quality of life.  Baseline: 10/9: 53% 11/29: 58% Goal status: IN PROGRESS  2.  Patient (< 73 years old) will complete five times sit to stand test in < 10 seconds without UE support indicating an increased LE strength and improved balance. Baseline: 10/9: 19.6 seconds with walking stick; one LOB 11/29: 20.56 seconds  Goal status: IN PROGRESS  3.  Patient will increase Berg Balance score by > 45/56 to demonstrate decreased fall risk during functional activities. Baseline: 10/9: 33/56 11/29: 43/56 Goal status: IN PROGRESS  4.  Patient will increase 10 meter walk test to >1.63ms as to improve gait speed for better community ambulation and to reduce fall risk. Baseline: 11/29: 1.19 m/s Goal status: MET  5.  Patient will increase ABC scale score >80% to demonstrate better functional mobility and better confidence with ADLs.  Baseline: 10/9: 67% 11/29: 75.6% Goal status: IN PROGRESS  6. Patient will tolerate 5 seconds of single leg stance without loss of balance to improve ability to get in and out of shower safely. Baseline: 11/29: unable to stand on single leg safely  Goal status: NEW  ASSESSMENT:  CLINICAL IMPRESSION: The patient continues to make significant progress with his overall LE strength and balance/coordination. The patient wasn't feeling completley like his normal self today following dialysis treatment yesterday and may not have been able to reach his maximum potential during goal testing today because of it. The patient made positive progress with their ABC score, Berg Balance Score, FOTO score, and 5 STS time. The patient was able  to achieve their 10 meter walk test goal and demonstrates great improvement with their gait speed and control walking at faster cadences. The patient still needs to work at  improving their balance while dual tasking, visually scanning, navigating obstacles and uneven terrain, and with single leg balance activities. The patient continues to be highly motivated to challenge himself and improve in functional capacity. Patient's condition has the potential to improve in response to therapy. Maximum improvement is yet to be obtained. The anticipated improvement is attainable and reasonable in a generally predictable time.   OBJECTIVE IMPAIRMENTS Abnormal gait, decreased activity tolerance, decreased balance, decreased coordination, decreased endurance, decreased mobility, difficulty walking, decreased strength, impaired flexibility, impaired sensation, improper body mechanics, and pain.   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   REHAB POTENTIAL: Good  CLINICAL DECISION MAKING: Evolving/moderate complexity  EVALUATION COMPLEXITY: Moderate  PLAN: PT FREQUENCY: 2x/week  PT DURATION: 12 weeks  PLANNED INTERVENTIONS: Therapeutic exercises, Therapeutic activity, Neuromuscular re-education, Balance training, Gait training, Patient/Family education, Self Care, Joint mobilization, Stair training, Vestibular training, Canalith repositioning,  Visual/preceptual remediation/compensation, DME instructions, Dry Needling, Cognitive remediation, Spinal mobilization, Cryotherapy, Moist heat, Manual lymph drainage, Compression bandaging, Taping, Vasopneumatic device, Ultrasound, Manual therapy, and Re-evaluation  PLAN FOR NEXT SESSION: Continue to progress dynamic balance and dual tasking balance. Work on single leg balance progressions.   Sudie Bailey SPT   This entire session was performed under direct supervision and direction of a licensed therapist/therapist assistant . I have personally read, edited and approve of the note as written.  Janna Arch, PT, DPT  [facility]  07/24/22, 12:32 PM

## 2022-07-29 ENCOUNTER — Ambulatory Visit: Payer: Medicare Other | Attending: Internal Medicine

## 2022-07-29 DIAGNOSIS — R262 Difficulty in walking, not elsewhere classified: Secondary | ICD-10-CM | POA: Diagnosis present

## 2022-07-29 DIAGNOSIS — R2681 Unsteadiness on feet: Secondary | ICD-10-CM | POA: Diagnosis present

## 2022-07-29 DIAGNOSIS — R278 Other lack of coordination: Secondary | ICD-10-CM | POA: Diagnosis present

## 2022-07-29 DIAGNOSIS — R269 Unspecified abnormalities of gait and mobility: Secondary | ICD-10-CM | POA: Insufficient documentation

## 2022-07-29 DIAGNOSIS — M6281 Muscle weakness (generalized): Secondary | ICD-10-CM | POA: Insufficient documentation

## 2022-07-29 NOTE — Therapy (Signed)
OUTPATIENT PHYSICAL THERAPY NEURO TREATMENT  Patient Name: Aalijah Lanphere MRN: [id number] DOB:[birth year], 51 y.o., male Today's Date: 07/29/2022  PCP: [name] MD REFERRING PROVIDER: [name] MD  PT End of Session - 07/29/22 1451     Visit Number 11    Number of Visits 24    Date for PT Re-Evaluation 08/26/22    Authorization Type Medicare A&B; Medicaid; 11/29 PN    Authorization Time Period 06/24/22-12/121/23 for 12 visits    Authorization - Number of Visits 12    Progress Note Due on Visit 10    PT Start Time 1450    PT Stop Time 1530    PT Time Calculation (min) 40 min    Equipment Utilized During Treatment Gait belt    Activity Tolerance Patient tolerated treatment well;No increased pain;Patient limited by fatigue    Behavior During Therapy [facility] for tasks assessed/performed            Past Medical History:  Diagnosis Date   Allergy    Anemia    Bell's palsy    Diabetes mellitus without complication ([place])    diet controlled   Hypertension    Hypothyroidism    Kidney stones    Pseudotumor cerebri    Stroke [facility])    Past Surgical History:  Procedure Laterality Date   COLONOSCOPY WITH PROPOFOL N/A 03/30/2020   Procedure: COLONOSCOPY WITH PROPOFOL;  Surgeon: Lesly Rubenstein, MD;  Location: [facility] ENDOSCOPY;  Service: Endoscopy;  Laterality: N/A;   LOOP RECORDER INSERTION N/A 01/27/2018   Procedure: LOOP RECORDER INSERTION;  Surgeon: [name], MD;  Location: [place] CV LAB;  Service: Cardiovascular;  Laterality: N/A;   LUMBAR PUNCTURE     as child   NO PAST SURGERIES     TEE WITHOUT CARDIOVERSION N/A 01/07/2018   Procedure: TRANSESOPHAGEAL ECHOCARDIOGRAM (TEE);  Surgeon: Minna Merritts, MD;  Location: [facility] ORS;  Service: Cardiovascular;  Laterality: N/A;   Patient Active Problem List   Diagnosis Date Noted   Hydronephrosis, left 05/04/2022   Type II diabetes mellitus with renal manifestations ([place]) 05/04/2022   Leukocytosis 05/04/2022    Pre-op evaluation 04/24/2022   Headache 07/07/2021   Hearing loss 07/07/2021   Open wound 01/25/2021   History of colon polyps 10/15/2020   Postoperative hemorrhage involving digestive system following digestive system procedure 09/19/2020   Acute cholecystitis without calculus 09/09/2020   Type 2 diabetes mellitus, with long-term current use of insulin ([place]) 09/09/2020   Cryptogenic stroke ([place]) 07/13/2020   History of loop recorder 07/13/2020   Weakness 06/18/2020   History of 2019 novel coronavirus disease (COVID-19) 05/20/2020   ESRD (end stage renal disease) ([place]) 04/04/2020   Pneumonia due to COVID-19 virus 04/03/2020   AKI (acute kidney injury) ([place]) 04/03/2020   Elevated troponin 04/03/2020   Acquired trigger finger 06/08/2019   Lymphedema 06/08/2019   Anemia 04/17/2019   Swelling of left lower extremity 01/10/2019   Facial droop 04/09/2018   Daytime somnolence 03/30/2018   Carotid artery disease ([place]) 02/03/2018   Intracranial vascular stenosis 09/12/2017   Cough 01/20/2017   Bell's palsy 11/10/2016   History of CVA (cerebrovascular accident) 11/10/2016   Benign localized hyperplasia of prostate with urinary obstruction 10/27/2016   History of nephrolithiasis 10/27/2016   TIA (transient ischemic attack) 10/20/2016   Near syncope 06/23/2016   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   Neuropathy 08/06/2015   Health care maintenance 08/06/2015   Essential hypertension 08/06/2015   Heme positive stool 10/10/2013  Hypothyroidism 10/10/2013   Microalbuminuria 10/10/2013   Hyperlipidemia 10/10/2013   B12 deficiency 10/10/2013   Diabetes ([place]) 07/04/2013   Environmental allergies 07/04/2013   ONSET DATE: 2-3 years  REFERRING DIAG: Neuropathy  THERAPY DIAG:  Difficulty in walking, not elsewhere classified  Muscle weakness (generalized)  Unsteadiness on feet  Abnormality of gait and mobility  Other lack of coordination  Rationale for Evaluation and Treatment  Rehabilitation  SUBJECTIVE:                                                                                                                                                                                             SUBJECTIVE STATEMENT: Patient reports they had diaylsis treatment on Saturday but reports they feel "recouped' now. Patient reports they are in no pain today and patient reports no falls or significant changes since last visit.   Pt accompanied by: self  PERTINENT HISTORY: Patient presents to physical therapy for neuropathy. He underwent banding of his L UE fistula on 05/13/22 and scheduled for additional banding on 06/07/22. 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 Patient reports his balance is very unsteady due to neuropathy in feet, feels weak in LE's.   PAIN:  Are you having pain? Yes: NPRS scale: 98/10 Pain location: L foot Pain description: sharp  Aggravating factors: worse at night Relieving factors: lyrica   PRECAUTIONS: Fall  WEIGHT BEARING RESTRICTIONS Yes no lifting >5 lb in arm   FALLS: Has patient fallen in last 6 months? No  LIVING ENVIRONMENT: Lives with: lives alone Lives in: House/apartment Stairs: Yes: Internal: flight steps; on right going up Has following equipment at home: Single point cane and Walker - 2 wheeled  PLOF: Independent  PATIENT GOALS to be more steady and walk as normally as possible.   OBJECTIVE:   LOWER EXTREMITY MMT:    MMT Right Eval Left Eval  Hip flexion 4- 3+  Hip extension    Hip abduction 4- 4+  Hip adduction 4- 3+  Knee flexion 4 4-  Knee extension 4 4  Ankle dorsiflexion 2+ 2+  Ankle plantarflexion 3+ 3+  (Blank rows = not tested)  FUNCTIONAL TESTs:  5 times sit to stand: 19.6 seconds with walking stick; one LOB 10 meter  walk test: 12 seconds Berg Balance Scale: 33/56  PATIENT SURVEYS:  ABC scale 67% FOTO 53%  TODAY'S TREATMENT:  Neuro Re-ed In // Bars:  Basketball shooting while lateral stepping over foam beams 1x trial   *Patient required  cues to take wide steps and to set their foot before bringing the second one over*  Resisted Walking with Matrix Machine  - Forward and backward walking 2x17.5 lbs - Forward and backward walking while having catch with ball 1x17.5 lbs - Facing opposite way forward and backward walking 2x17.5 lbs - Lateral Walking 2x17.5 lbs (B)  *Patient required cues to maintain a wide BOS and to keep entire foot surface contacted to the ground  TherEx Leg Press Machine: some assistance for leg positioning 1x55 lbs  2x70 lbs   Standing in // bars Strengthening: (7.5 lb AW on) - Heel raises 10x ea - Hamstring raises 10x ea - Hip extension 10x ea - Hip Abd 10x ea - Marches 10x ea  Seated Strengthening: - Knee extension 10x ea  PATIENT EDUCATION: Education details: HEP, goals, POC Person educated: Patient Education method: Explanation, Demonstration, Tactile cues, Verbal cues, and Handouts Education comprehension: verbalized understanding, returned demonstration, verbal cues required, and tactile cues required  HOME EXERCISE PROGRAM: Access Code: [email or web address] URL: [email or web address] Date: 06/03/2022 Prepared by: Janna Arch  Exercises - Seated Heel Toe Raises  - 1 x daily - 7 x weekly - 2 sets - 10 reps - 5 hold - Standing Tandem Balance with Counter Support  - 1 x daily - 7 x weekly - 2 sets - 2 reps - 30 hold - Standing March with Counter Support  - 1 x daily - 7 x weekly - 2 sets - 10 reps - 5 hold  GOALS: Goals reviewed with patient? Yes  SHORT TERM GOALS: Target date: 07/01/2022  Patient will be independent in home exercise program to improve strength/mobility for better functional independence with ADLs. Baseline:10/9; HEP given  Goal  status: INITIAL  LONG TERM GOALS: Target date: 08/26/2022  Patient will increase FOTO score to equal to or greater than   63%  to demonstrate statistically significant improvement in mobility and quality of life.  Baseline: 10/9: 53% 11/29: 58% Goal status: IN PROGRESS  2.  Patient (< 66 years old) will complete five times sit to stand test in < 10 seconds without UE support indicating an increased LE strength and improved balance. Baseline: 10/9: 19.6 seconds with walking stick; one LOB 11/29: 20.56 seconds  Goal status: IN PROGRESS  3.  Patient will increase Berg Balance score by > 45/56 to demonstrate decreased fall risk during functional activities. Baseline: 10/9: 33/56 11/29: 43/56 Goal status: IN PROGRESS  4.  Patient will increase 10 meter walk test to >1.65ms as to improve gait speed for better community ambulation and to reduce fall risk. Baseline: 11/29: 1.19 m/s Goal status: MET  5.  Patient will increase ABC scale score >80% to demonstrate better functional mobility and better confidence with ADLs.  Baseline: 10/9: 67% 11/29: 75.6% Goal status: IN PROGRESS  6. Patient will tolerate 5 seconds of single leg stance without loss of balance to improve ability to get in and out of shower safely. Baseline: 11/29: unable to stand on single leg safely  Goal status: NEW  ASSESSMENT:  CLINICAL IMPRESSION: The patient always comes to skilled therapy motivated and ready to accomplish whatever tasks are asked of him. The patient continues to make significant progress with his overall LE strength and balance/coordination. The patient continues to be able to increase resistance during strengthening exercises and demonstrates increased LE control every session. The patient required some cues to maintain a wide BOS and set their feet before stepping during resistance walking with the matrix machine.  The patient will continue to benefit from skilled therapy in order to continue to progress  balance and overall functional capacity with ADLs.   OBJECTIVE IMPAIRMENTS Abnormal gait, decreased activity tolerance, decreased balance, decreased coordination, decreased endurance, decreased mobility, difficulty walking, decreased strength, impaired flexibility, impaired sensation, improper body mechanics, and pain.   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   REHAB POTENTIAL: Good  CLINICAL DECISION MAKING: Evolving/moderate complexity  EVALUATION COMPLEXITY: Moderate  PLAN: PT FREQUENCY: 2x/week  PT DURATION: 12 weeks  PLANNED INTERVENTIONS: Therapeutic exercises, Therapeutic activity, Neuromuscular re-education, Balance training, Gait training, Patient/Family education, Self Care, Joint mobilization, Stair training, Vestibular training, Canalith repositioning, Visual/preceptual remediation/compensation, DME instructions, Dry Needling, Cognitive remediation, Spinal mobilization, Cryotherapy, Moist heat, Manual lymph drainage, Compression bandaging, Taping, Vasopneumatic device, Ultrasound, Manual therapy, and Re-evaluation  PLAN FOR NEXT SESSION: Continue to progress dynamic balance and dual tasking balance. Work on single leg balance progressions.   Sudie Bailey  SPT   This entire session was performed under direct supervision and direction of a licensed therapist/therapist assistant . I have personally read, edited and approve of the note as written.  Janna Arch, PT, DPT  [facility]  07/29/22, 4:40 PM

## 2022-07-31 ENCOUNTER — Ambulatory Visit: Payer: Medicare Other

## 2022-07-31 DIAGNOSIS — M6281 Muscle weakness (generalized): Secondary | ICD-10-CM

## 2022-07-31 DIAGNOSIS — R262 Difficulty in walking, not elsewhere classified: Secondary | ICD-10-CM | POA: Diagnosis not present

## 2022-07-31 DIAGNOSIS — R269 Unspecified abnormalities of gait and mobility: Secondary | ICD-10-CM

## 2022-07-31 DIAGNOSIS — R2681 Unsteadiness on feet: Secondary | ICD-10-CM

## 2022-07-31 NOTE — Therapy (Signed)
OUTPATIENT PHYSICAL THERAPY NEURO TREATMENT  Patient Name: Gregory Crane MRN: [id number] DOB:May 23, 1971, 51 y.o., male Today's Date: 07/31/2022  PCP: [name] MD REFERRING PROVIDER: [name] MD  PT End of Session - 07/31/22 1640     Visit Number 12    Number of Visits 24    Date for PT Re-Evaluation 08/26/22    Authorization Type Medicare A&B; Medicaid; 11/29 PN    Authorization Time Period 06/24/22-12/121/23 for 12 visits    Authorization - Number of Visits 12    Progress Note Due on Visit 10    PT Start Time 1015    PT Stop Time 1100    PT Time Calculation (min) 45 min    Equipment Utilized During Treatment Gait belt    Activity Tolerance Patient tolerated treatment well;No increased pain;Patient limited by fatigue    Behavior During Therapy [facility] for tasks assessed/performed             Past Medical History:  Diagnosis Date   Allergy    Anemia    Bell's palsy    Diabetes mellitus without complication ([place])    diet controlled   Hypertension    Hypothyroidism    Kidney stones    Pseudotumor cerebri    Stroke [facility])    Past Surgical History:  Procedure Laterality Date   COLONOSCOPY WITH PROPOFOL N/A 03/30/2020   Procedure: COLONOSCOPY WITH PROPOFOL;  Surgeon: Lesly Rubenstein, MD;  Location: [facility] ENDOSCOPY;  Service: Endoscopy;  Laterality: N/A;   LOOP RECORDER INSERTION N/A 01/27/2018   Procedure: LOOP RECORDER INSERTION;  Surgeon: [name], MD;  Location: [place] CV LAB;  Service: Cardiovascular;  Laterality: N/A;   LUMBAR PUNCTURE     as child   NO PAST SURGERIES     TEE WITHOUT CARDIOVERSION N/A 01/07/2018   Procedure: TRANSESOPHAGEAL ECHOCARDIOGRAM (TEE);  Surgeon: Minna Merritts, MD;  Location: [facility] ORS;  Service: Cardiovascular;  Laterality: N/A;   Patient Active Problem List   Diagnosis Date Noted   Hydronephrosis, left 05/04/2022   Type II diabetes mellitus with renal manifestations ([place]) 05/04/2022   Leukocytosis  05/04/2022   Pre-op evaluation 04/24/2022   Headache 07/07/2021   Hearing loss 07/07/2021   Open wound 01/25/2021   History of colon polyps 10/15/2020   Postoperative hemorrhage involving digestive system following digestive system procedure 09/19/2020   Acute cholecystitis without calculus 09/09/2020   Type 2 diabetes mellitus, with long-term current use of insulin ([place]) 09/09/2020   Cryptogenic stroke ([place]) 07/13/2020   History of loop recorder 07/13/2020   Weakness 06/18/2020   History of 2019 novel coronavirus disease (COVID-19) 05/20/2020   ESRD (end stage renal disease) ([place]) 04/04/2020   Pneumonia due to COVID-19 virus 04/03/2020   AKI (acute kidney injury) ([place]) 04/03/2020   Elevated troponin 04/03/2020   Acquired trigger finger 06/08/2019   Lymphedema 06/08/2019   Anemia 04/17/2019   Swelling of left lower extremity 01/10/2019   Facial droop 04/09/2018   Daytime somnolence 03/30/2018   Carotid artery disease ([place]) 02/03/2018   Intracranial vascular stenosis 09/12/2017   Cough 01/20/2017   Bell's palsy 11/10/2016   History of CVA (cerebrovascular accident) 11/10/2016   Benign localized hyperplasia of prostate with urinary obstruction 10/27/2016   History of nephrolithiasis 10/27/2016   TIA (transient ischemic attack) 10/20/2016   Near syncope 06/23/2016   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   Neuropathy 08/06/2015   Health care maintenance 08/06/2015   Essential hypertension 08/06/2015   Heme positive stool 10/10/2013  Hypothyroidism 10/10/2013   Microalbuminuria 10/10/2013   Hyperlipidemia 10/10/2013   B12 deficiency 10/10/2013   Diabetes ([place]) 07/04/2013   Environmental allergies 07/04/2013   ONSET DATE: 2-3 years  REFERRING DIAG: Neuropathy  THERAPY DIAG:  Difficulty in walking, not elsewhere classified  Muscle weakness (generalized)  Unsteadiness on feet  Abnormality of gait and mobility  Rationale for Evaluation and Treatment  Rehabilitation  SUBJECTIVE:                                                                                                                                                                                             SUBJECTIVE STATEMENT: Patient reports they had diaylsis treatment on Tuesday but reports they are feeling good today. Patient reports no falls or significant changes since last visit.   Pt accompanied by: self  PERTINENT HISTORY: Patient presents to physical therapy for neuropathy. He underwent banding of his L UE fistula on 05/13/22 and scheduled for additional banding on 06/07/22. 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 Patient reports his balance is very unsteady due to neuropathy in feet, feels weak in LE's.   PAIN:  Are you having pain? Yes: NPRS scale: 98/10 Pain location: L foot Pain description: sharp  Aggravating factors: worse at night Relieving factors: lyrica   PRECAUTIONS: Fall  WEIGHT BEARING RESTRICTIONS Yes no lifting >5 lb in arm   FALLS: Has patient fallen in last 6 months? No  LIVING ENVIRONMENT: Lives with: lives alone Lives in: House/apartment Stairs: Yes: Internal: flight steps; on right going up Has following equipment at home: Single point cane and Walker - 2 wheeled  PLOF: Independent  PATIENT GOALS to be more steady and walk as normally as possible.   OBJECTIVE:   LOWER EXTREMITY MMT:    MMT Right Eval Left Eval  Hip flexion 4- 3+  Hip extension    Hip abduction 4- 4+  Hip adduction 4- 3+  Knee flexion 4 4-  Knee extension 4 4  Ankle dorsiflexion 2+ 2+  Ankle plantarflexion 3+ 3+  (Blank rows = not tested)  FUNCTIONAL TESTs:  5 times sit to stand: 19.6 seconds with walking stick; one LOB 10 meter walk test: 12 seconds Berg Balance Scale:  33/56  PATIENT SURVEYS:  ABC scale 67% FOTO 53%  TODAY'S TREATMENT:  Neuro Re-ed Blaze Pod Dual Tasking and Balance Progressions:  - Toe taps (5 pods, random setting) 2x45 second trails  - Toe taps (5 pods, distractor setting) 2x45 second trails  -  Toe taps with home base in the middle of square (5 pods, home base setting) 1x10 reps  - Toe taps with home base in the middle of square while having a catch following each toe tap (5 pods, home base setting) 1x10 reps  - Toe taps with home base in the middle of square while having a catch throughout (5 pods, home base setting) 1x10 reps  - Lateral stepping toe taps on red foam mat (5 blaze pods, random setting) 2x30 second trails   TherEx LE strengthening:  Seated  - 7.5 lbs AW marching 10x ea - 7.5 lb AW LAQ 10x ea - Lateral marching over hedge hog alternating leg 10x ea  - BTB hamstring curls 10x ea  - Seated ankle raises 2x10   PATIENT EDUCATION: Education details: HEP, goals, POC Person educated: Patient Education method: Explanation, Demonstration, Tactile cues, Verbal cues, and Handouts Education comprehension: verbalized understanding, returned demonstration, verbal cues required, and tactile cues required  HOME EXERCISE PROGRAM: Access Code: [email or web address] URL: [email or web address] Date: 06/03/2022 Prepared by: Janna Arch  Exercises - Seated Heel Toe Raises  - 1 x daily - 7 x weekly - 2 sets - 10 reps - 5 hold - Standing Tandem Balance with Counter Support  - 1 x daily - 7 x weekly - 2 sets - 2 reps - 30 hold - Standing March with Counter Support  - 1 x daily - 7 x weekly - 2 sets - 10 reps - 5 hold  GOALS: Goals reviewed with patient? Yes  SHORT TERM GOALS: Target date: 07/01/2022  Patient will be independent in home exercise program to improve strength/mobility for better functional independence with ADLs. Baseline:10/9; HEP given  Goal status: INITIAL  LONG TERM GOALS: Target date:  08/26/2022  Patient will increase FOTO score to equal to or greater than   63%  to demonstrate statistically significant improvement in mobility and quality of life.  Baseline: 10/9: 53% 11/29: 58% Goal status: IN PROGRESS  2.  Patient (< 49 years old) will complete five times sit to stand test in < 10 seconds without UE support indicating an increased LE strength and improved balance. Baseline: 10/9: 19.6 seconds with walking stick; one LOB 11/29: 20.56 seconds  Goal status: IN PROGRESS  3.  Patient will increase Berg Balance score by > 45/56 to demonstrate decreased fall risk during functional activities. Baseline: 10/9: 33/56 11/29: 43/56 Goal status: IN PROGRESS  4.  Patient will increase 10 meter walk test to >1.82ms as to improve gait speed for better community ambulation and to reduce fall risk. Baseline: 11/29: 1.19 m/s Goal status: MET  5.  Patient will increase ABC scale score >80% to demonstrate better functional mobility and better confidence with ADLs.  Baseline: 10/9: 67% 11/29: 75.6% Goal status: IN PROGRESS  6. Patient will tolerate 5 seconds of single leg stance without loss of balance to improve ability to get in and out of shower safely. Baseline: 11/29: unable to stand on single leg safely  Goal status: NEW  ASSESSMENT:  CLINICAL IMPRESSION: The patient always comes to skilled therapy motivated and in good spirits. The patient performed great with blaze pod dual tasking dynamic balance progressions with minimal difficulty. The patient had some moments where they lost their footing and required close CGA from the SPT but overall the patient continues to make significant progress with his balance/coordination. The patient did great with strengthening exercises and demonstrates increased LE strength every session. The patient will continue to benefit from  skilled therapy in order to continue to progress balance and overall functional capacity with ADLs.   OBJECTIVE  IMPAIRMENTS Abnormal gait, decreased activity tolerance, decreased balance, decreased coordination, decreased endurance, decreased mobility, difficulty walking, decreased strength, impaired flexibility, impaired sensation, improper body mechanics, and pain.   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   REHAB POTENTIAL: Good  CLINICAL DECISION MAKING: Evolving/moderate complexity  EVALUATION COMPLEXITY: Moderate  PLAN: PT FREQUENCY: 2x/week  PT DURATION: 12 weeks  PLANNED INTERVENTIONS: Therapeutic exercises, Therapeutic activity, Neuromuscular re-education, Balance training, Gait training, Patient/Family education, Self Care, Joint mobilization, Stair training, Vestibular training, Canalith repositioning, Visual/preceptual remediation/compensation, DME instructions, Dry Needling, Cognitive remediation, Spinal mobilization, Cryotherapy, Moist heat, Manual lymph drainage, Compression bandaging, Taping, Vasopneumatic device, Ultrasound, Manual therapy, and Re-evaluation  PLAN FOR NEXT SESSION: Continue to progress dynamic balance and dual tasking balance. Work on single leg balance progressions.   Sudie Bailey SPT   This entire session was performed under direct supervision  and direction of a licensed therapist/therapist assistant . I have personally read, edited and approve of the note as written.  Janna Arch, PT, DPT  [facility]  07/31/22, 4:42 PM

## 2022-08-02 LAB — GLUTAMIC ACID DECARBOXYLASE AUTO ABS: Glutamic Acid Decarb Ab: 7.9 U/mL — ABNORMAL HIGH (ref 0.0–5.0)

## 2022-08-02 LAB — ANTI-ISLET CELL ANTIBODY: Islet Cell Ab: NEGATIVE

## 2022-08-05 ENCOUNTER — Ambulatory Visit: Payer: Medicare Other

## 2022-08-05 DIAGNOSIS — R278 Other lack of coordination: Secondary | ICD-10-CM

## 2022-08-05 DIAGNOSIS — R262 Difficulty in walking, not elsewhere classified: Secondary | ICD-10-CM

## 2022-08-05 DIAGNOSIS — R2681 Unsteadiness on feet: Secondary | ICD-10-CM

## 2022-08-05 DIAGNOSIS — M6281 Muscle weakness (generalized): Secondary | ICD-10-CM

## 2022-08-05 IMAGING — CR DG CHEST 2V
2 series · 2 of 2 positions shown · non-contrast
Comparison: Chest radiograph dated 09/12/2017

CLINICAL DATA: 48-year-old male with weakness.

EXAM:
CHEST - 2 VIEW

[chest pa]
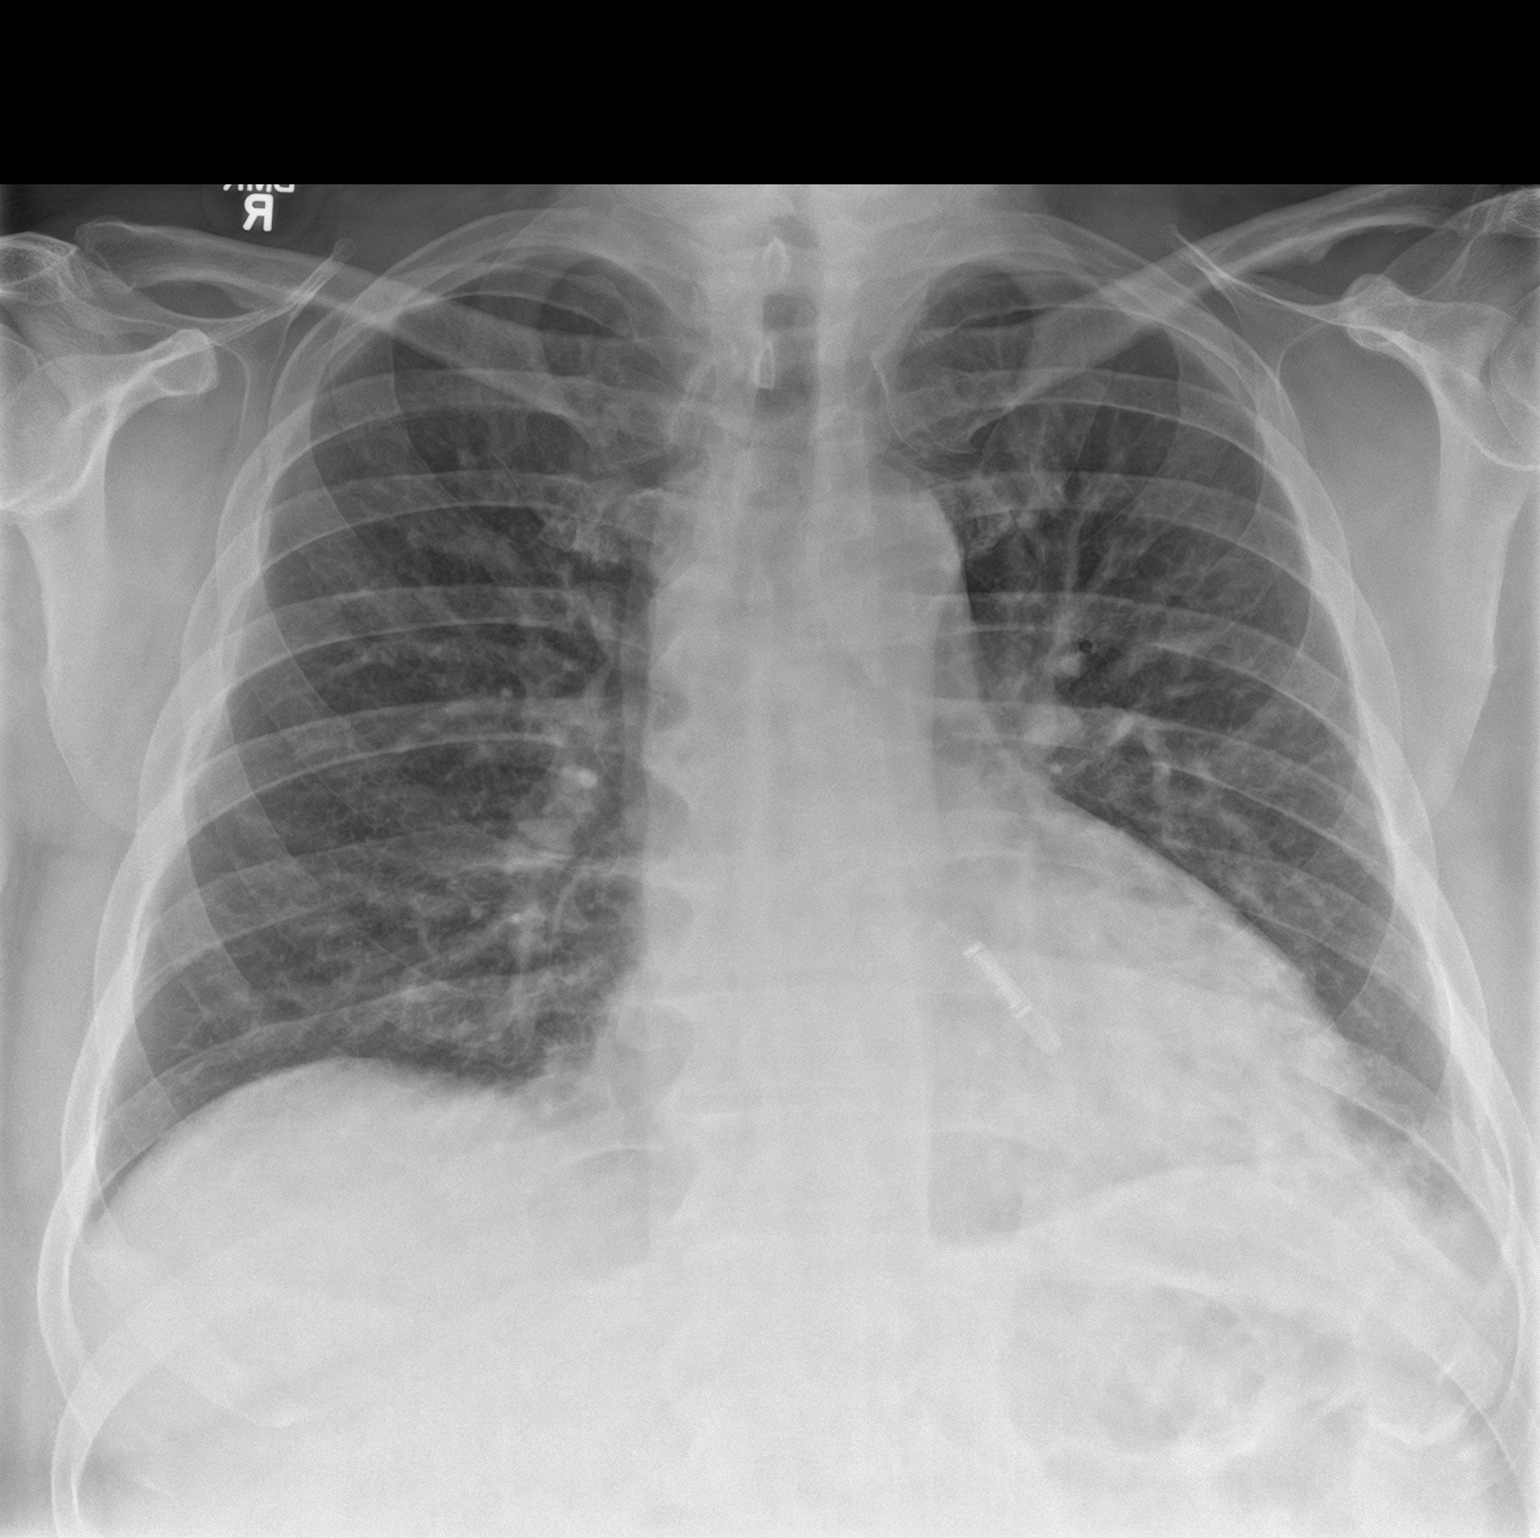

[chest lat]
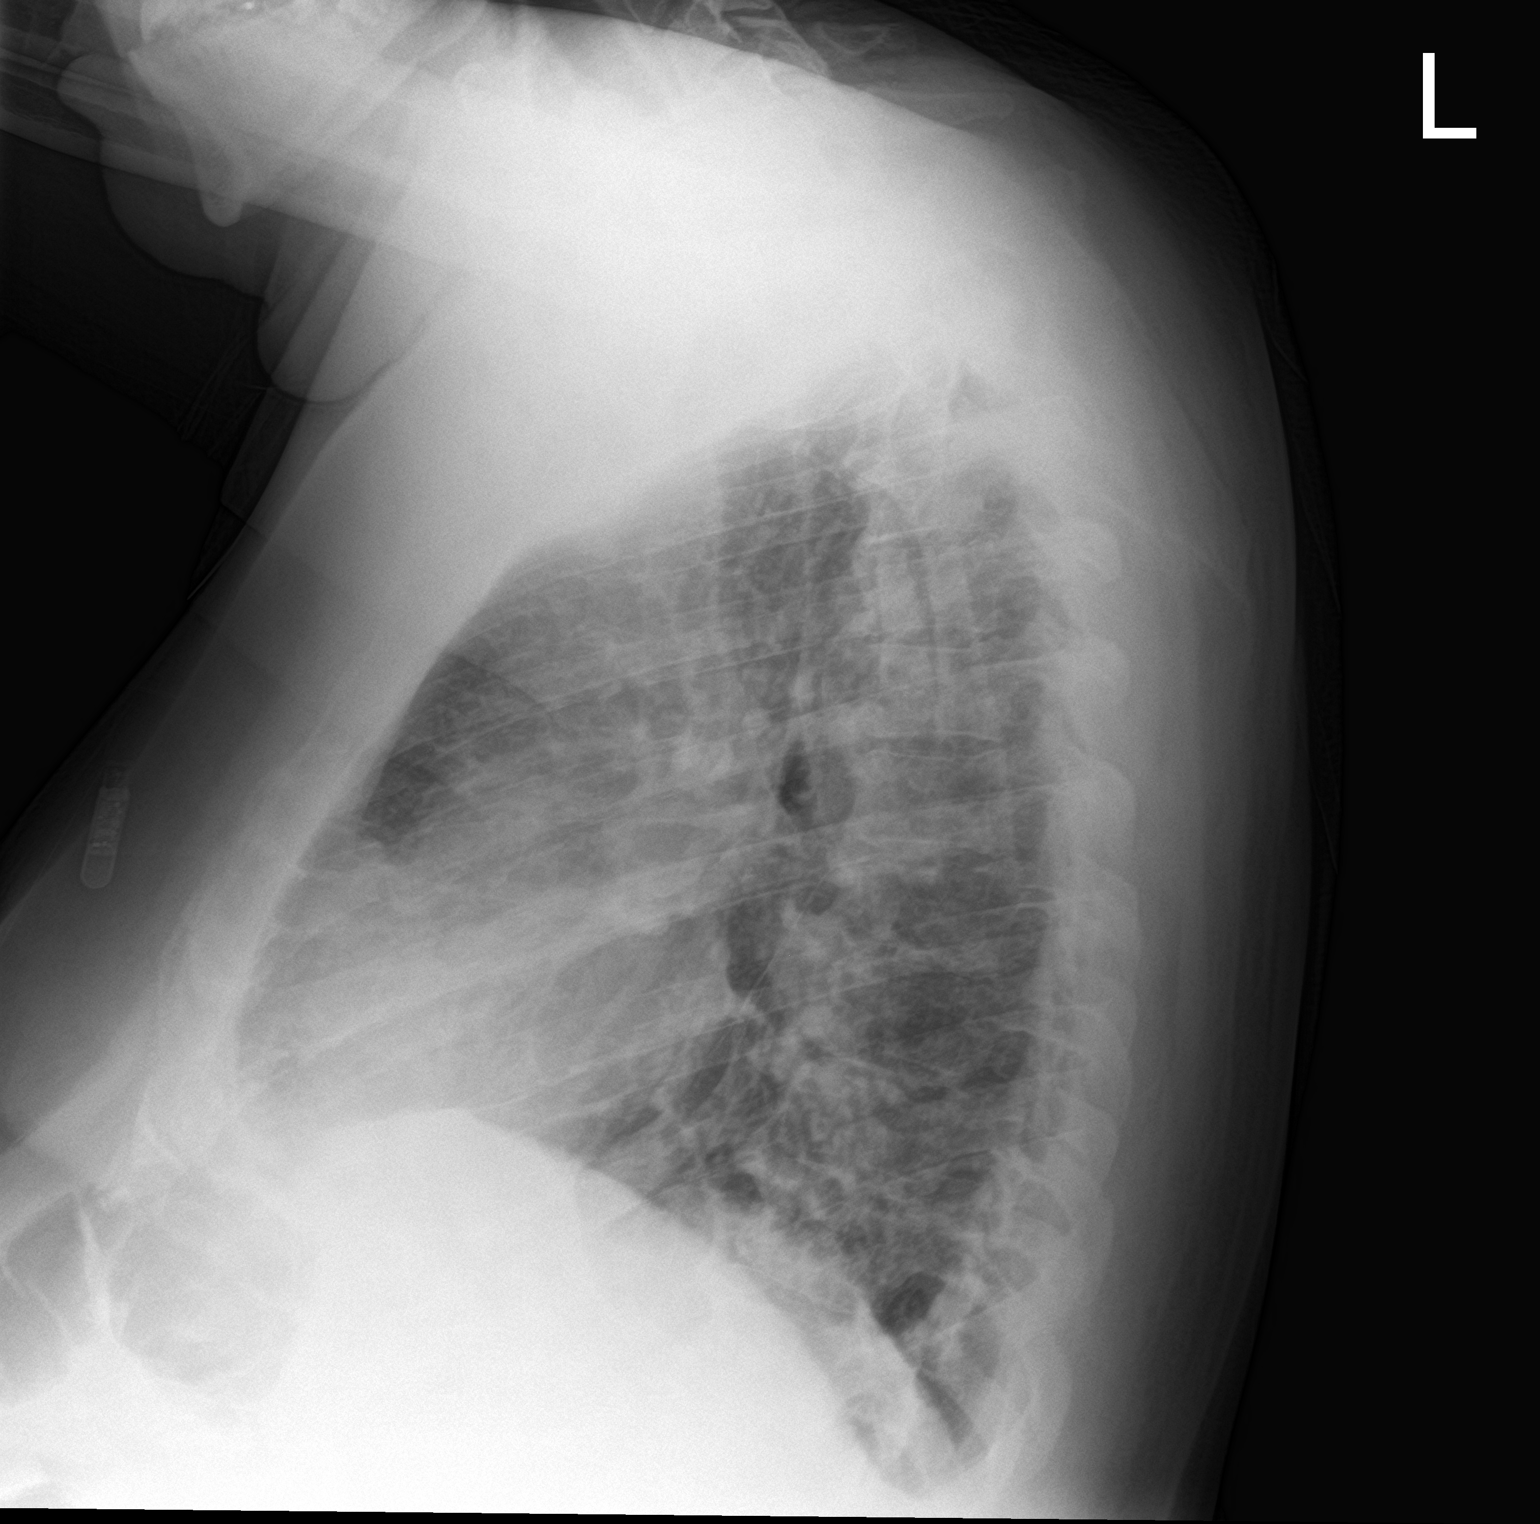

[2 of 2 positions shown; findings below may reference images not displayed]

FINDINGS: Small left pleural effusion with left lung base densities which may
represent atelectasis or infiltrate. The right lung is clear. No
pneumothorax. There is mild cardiomegaly. A loop recorder device is
noted. No acute osseous pathology.
IMPRESSION: Small left pleural effusion with left lung base atelectasis or
infiltrate.

## 2022-08-05 NOTE — Therapy (Signed)
OUTPATIENT PHYSICAL THERAPY NEURO TREATMENT  Patient Name: Gregory Crane MRN: [id number] DOB:[birth year], 51 y.o., male Today's Date: 08/05/2022  PCP: [name] MD REFERRING PROVIDER: [name] MD  PT End of Session - 08/05/22 1424     Visit Number 13    Number of Visits 24    Date for PT Re-Evaluation 08/26/22    Authorization Type Medicare A&B; Medicaid; 11/29 PN    Authorization Time Period 06/24/22-12/121/23 for 12 visits    Authorization - Number of Visits 12    Progress Note Due on Visit 10    PT Start Time 1425    PT Stop Time 1510    PT Time Calculation (min) 45 min    Equipment Utilized During Treatment Gait belt    Activity Tolerance Patient tolerated treatment well;No increased pain;Patient limited by fatigue    Behavior During Therapy [facility] for tasks assessed/performed            Past Medical History:  Diagnosis Date   Allergy    Anemia    Bell's palsy    Diabetes mellitus without complication ([place])    diet controlled   Hypertension    Hypothyroidism    Kidney stones    Pseudotumor cerebri    Stroke [facility])    Past Surgical History:  Procedure Laterality Date   COLONOSCOPY WITH PROPOFOL N/A 03/30/2020   Procedure: COLONOSCOPY WITH PROPOFOL;  Surgeon: Lesly Rubenstein, MD;  Location: [facility] ENDOSCOPY;  Service: Endoscopy;  Laterality: N/A;   LOOP RECORDER INSERTION N/A 01/27/2018   Procedure: LOOP RECORDER INSERTION;  Surgeon: [name], MD;  Location: [facility];  Service: Cardiovascular;  Laterality: N/A;   LUMBAR PUNCTURE     as child   NO PAST SURGERIES     TEE WITHOUT CARDIOVERSION N/A 01/07/2018   Procedure: TRANSESOPHAGEAL ECHOCARDIOGRAM (TEE);  Surgeon: Minna Merritts, MD;  Location: [facility] ORS;  Service: Cardiovascular;  Laterality: N/A;   Patient Active Problem List   Diagnosis Date Noted   Hydronephrosis, left 05/04/2022   Type II diabetes mellitus with renal manifestations ([place]) 05/04/2022   Leukocytosis  05/04/2022   Pre-op evaluation 04/24/2022   Headache 07/07/2021   Hearing loss 07/07/2021   Open wound 01/25/2021   History of colon polyps 10/15/2020   Postoperative hemorrhage involving digestive system following digestive system procedure 09/19/2020   Acute cholecystitis without calculus 09/09/2020   Type 2 diabetes mellitus, with long-term current use of insulin ([place]) 09/09/2020   Cryptogenic stroke ([place]) 07/13/2020   History of loop recorder 07/13/2020   Weakness 06/18/2020   History of 2019 novel coronavirus disease (COVID-19) 05/20/2020   ESRD (end stage renal disease) ([place]) 04/04/2020   Pneumonia due to COVID-19 virus 04/03/2020   AKI (acute kidney injury) ([place]) 04/03/2020   Elevated troponin 04/03/2020   Acquired trigger finger 06/08/2019   Lymphedema 06/08/2019   Anemia 04/17/2019   Swelling of left lower extremity 01/10/2019   Facial droop 04/09/2018   Daytime somnolence 03/30/2018   Carotid artery disease ([place]) 02/03/2018   Intracranial vascular stenosis 09/12/2017   Cough 01/20/2017   Bell's palsy 11/10/2016   History of CVA (cerebrovascular accident) 11/10/2016   Benign localized hyperplasia of prostate with urinary obstruction 10/27/2016   History of nephrolithiasis 10/27/2016   TIA (transient ischemic attack) 10/20/2016   Near syncope 06/23/2016   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   Neuropathy 08/06/2015   Health care maintenance 08/06/2015   Essential hypertension 08/06/2015   Heme positive stool 10/10/2013  Hypothyroidism 10/10/2013   Microalbuminuria 10/10/2013   Hyperlipidemia 10/10/2013   B12 deficiency 10/10/2013   Diabetes ([place]) 07/04/2013   Environmental allergies 07/04/2013   ONSET DATE: 2-3 years  REFERRING DIAG: Neuropathy  THERAPY DIAG:  Difficulty in walking, not elsewhere classified  Muscle weakness (generalized)  Unsteadiness on feet  Other lack of coordination  Rationale for Evaluation and Treatment Rehabilitation  SUBJECTIVE:                                                                                                                                                                                              SUBJECTIVE STATEMENT: Patient reports they are feeling alright today. Patient reports no falls or significant changes since last visit. Patient reports they are colonoscopy on Thursday and will not be able to make their session.    Pt accompanied by: self  PERTINENT HISTORY: Patient presents to physical therapy for neuropathy. He underwent banding of his L UE fistula on 05/13/22 and scheduled for additional banding on 06/07/22. 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 Patient reports his balance is very unsteady due to neuropathy in feet, feels weak in LE's.   PAIN:  Are you having pain? Yes: NPRS scale: 98/10 Pain location: L foot Pain description: sharp  Aggravating factors: worse at night Relieving factors: lyrica   PRECAUTIONS: Fall  WEIGHT BEARING RESTRICTIONS Yes no lifting >5 lb in arm   FALLS: Has patient fallen in last 6 months? No  LIVING ENVIRONMENT: Lives with: lives alone Lives in: House/apartment Stairs: Yes: Internal: flight steps; on right going up Has following equipment at home: Single point cane and Walker - 2 wheeled  PLOF: Independent  PATIENT GOALS to be more steady and walk as normally as possible.   OBJECTIVE:   LOWER EXTREMITY MMT:    MMT Right Eval Left Eval  Hip flexion 4- 3+  Hip extension    Hip abduction 4- 4+  Hip adduction 4- 3+  Knee flexion 4 4-  Knee extension 4 4  Ankle dorsiflexion 2+ 2+  Ankle plantarflexion 3+ 3+  (Blank rows = not tested)  FUNCTIONAL TESTs:  5 times sit to stand: 19.6 seconds with walking stick; one LOB 10 meter walk test: 12 seconds Berg  Balance Scale: 33/56  PATIENT SURVEYS:  ABC scale 67% FOTO 53%  TODAY'S TREATMENT:  Neuro Re-ed - [facility] setting with 5 pods on the floor for toe taps and the home base pod elevated for functional  reach 10 hits  - [facility] setting with 5 pods on the floor for toe taps and the home base pod elevated for functional reach with foam mat added 10 hits  - Blaze pods random setting surrounding foam mat toe taps 10 hits  - Blaze pods distractor setting surrounding foam mat toe taps 10 hits   Blaze Pod Dual Tasking and Resisted Walking with [employer] Machine  - Forward and backward walking 2x17.5 lbs - Forward and backward walking while toe taping blaze pods 17.5 lbs (2 trials of 10 toe taps) - Lateral Walking while toe taping blaze pods 17.5 lbs (B), 2 trials of 10 toe taps    *Patient required cues to maintain a wide BOS, set themselves up before toe tapping, and to get into a small squat position for added stability*    TherEx Standing in // bars Strengthening: (7.5 lb AW on) - Heel raises 10x2 ea - Hamstring raises 10x2 ea - Hip extension 10x2 ea - Hip Abd 10x2 ea - Marches 10x2 ea  PATIENT EDUCATION: Education details: HEP, goals, POC Person educated: Patient Education method: Explanation, Demonstration, Tactile cues, Verbal cues, and Handouts Education comprehension: verbalized understanding, returned demonstration, verbal cues required, and tactile cues required  HOME EXERCISE PROGRAM: Access Code: [email or web address] URL: [email or web address] Date: 06/03/2022 Prepared by: Janna Arch  Exercises - Seated Heel Toe Raises  - 1 x daily - 7 x weekly - 2 sets - 10 reps - 5 hold - Standing Tandem Balance with Counter Support  - 1 x daily - 7 x weekly - 2 sets - 2 reps - 30 hold - Standing March with Counter Support  - 1 x daily - 7 x weekly - 2 sets - 10 reps - 5 hold  GOALS: Goals reviewed with patient? Yes  SHORT TERM GOALS: Target date:  07/01/2022  Patient will be independent in home exercise program to improve strength/mobility for better functional independence with ADLs. Baseline:10/9; HEP given  Goal status: INITIAL  LONG TERM GOALS: Target date: 08/26/2022  Patient will increase FOTO score to equal to or greater than   63%  to demonstrate statistically significant improvement in mobility and quality of life.  Baseline: 10/9: 53% 11/29: 58% Goal status: IN PROGRESS  2.  Patient (< 61 years old) will complete five times sit to stand test in < 10 seconds without UE support indicating an increased LE strength and improved balance. Baseline: 10/9: 19.6 seconds with walking stick; one LOB 11/29: 20.56 seconds  Goal status: IN PROGRESS  3.  Patient will increase Berg Balance score by > 45/56 to demonstrate decreased fall risk during functional activities. Baseline: 10/9: 33/56 11/29: 43/56 Goal status: IN PROGRESS  4.  Patient will increase 10 meter walk test to >1.1ms as to improve gait speed for better community ambulation and to reduce fall risk. Baseline: 11/29: 1.19 m/s Goal status: MET  5.  Patient will increase ABC scale score >80% to demonstrate better functional mobility and better confidence with ADLs.  Baseline: 10/9: 67% 11/29: 75.6% Goal status: IN PROGRESS  6. Patient will tolerate 5 seconds of single leg stance without loss of balance to improve ability to get in and out of shower safely. Baseline: 11/29: unable to stand on single leg safely  Goal status: NEW  ASSESSMENT:  CLINICAL IMPRESSION: The patient continues to present to skilled therapy motivated. The patient performed great with blaze pod dual tasking dynamic balance progressions, the patient did display some sway when ambulating  on the foam mat but continues to display increased ankle strategies and reactions every session. The patient was able to progress resisted walking today as they were able to add a dual tasking element to it with  minimal difficulty. The patient required close CGA from the SPT for safety but the patient is able to compete every activity independently. The patient continues to make significant progress with his balance/coordination. The patient did great with strengthening exercises following dual task activities and demonstrates increased LE strength every session. The patient will continue to benefit from skilled therapy in order to continue to progress balance and overall functional capacity with ADLs.   OBJECTIVE IMPAIRMENTS Abnormal gait, decreased activity tolerance, decreased balance, decreased coordination, decreased endurance, decreased mobility, difficulty walking, decreased strength, impaired flexibility, impaired sensation, improper body mechanics, and pain.   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   REHAB POTENTIAL: Good  CLINICAL DECISION MAKING: Evolving/moderate complexity  EVALUATION COMPLEXITY: Moderate  PLAN: PT FREQUENCY: 2x/week  PT DURATION: 12 weeks  PLANNED INTERVENTIONS: Therapeutic exercises, Therapeutic activity, Neuromuscular re-education, Balance training, Gait training, Patient/Family education, Self Care, Joint mobilization, Stair training, Vestibular  training, Canalith repositioning, Visual/preceptual remediation/compensation, DME instructions, Dry Needling, Cognitive remediation, Spinal mobilization, Cryotherapy, Moist heat, Manual lymph drainage, Compression bandaging, Taping, Vasopneumatic device, Ultrasound, Manual therapy, and Re-evaluation  PLAN FOR NEXT SESSION: Continue to progress dynamic balance and dual tasking balance. Work on single leg balance progressions.   Sudie Bailey SPT   This entire session was performed under direct supervision and direction of a licensed therapist/therapist assistant . I have personally read, edited and approve of the note as written.  Janna Arch, PT, DPT  [facility]  08/05/22, 3:44 PM

## 2022-08-07 ENCOUNTER — Ambulatory Visit: Payer: Medicare Other

## 2022-08-08 NOTE — Therapy (Signed)
OUTPATIENT PHYSICAL THERAPY NEURO TREATMENT  Patient Name: Gregory Crane MRN: [id number] DOB:[birth year], 51 y.o., male Today's Date: 08/12/2022  PCP: [name] MD REFERRING PROVIDER: [name] MD  PT End of Session - 08/12/22 1447     Visit Number 14    Number of Visits 24    Date for PT Re-Evaluation 08/26/22    Authorization Type Medicare A&B; Medicaid; 11/29 PN    Authorization Time Period 06/24/22-12/121/23 for 12 visits    Authorization - Number of Visits 12    Progress Note Due on Visit 10    PT Start Time 1446    PT Stop Time 1530    PT Time Calculation (min) 44 min    Equipment Utilized During Treatment Gait belt    Activity Tolerance Patient tolerated treatment well;No increased pain;Patient limited by fatigue    Behavior During Therapy [name] for tasks assessed/performed             Past Medical History:  Diagnosis Date   Allergy    Anemia    Bell's palsy    Diabetes mellitus without complication ([place])    diet controlled   Hypertension    Hypothyroidism    Kidney stones    Pseudotumor cerebri    Stroke [facility])    Past Surgical History:  Procedure Laterality Date   COLONOSCOPY WITH PROPOFOL N/A 03/30/2020   Procedure: COLONOSCOPY WITH PROPOFOL;  Surgeon: Lesly Rubenstein, MD;  Location: [facility] ENDOSCOPY;  Service: Endoscopy;  Laterality: N/A;   LOOP RECORDER INSERTION N/A 01/27/2018   Procedure: LOOP RECORDER INSERTION;  Surgeon: [name], MD;  Location: [facility];  Service: Cardiovascular;  Laterality: N/A;   LUMBAR PUNCTURE     as child   NO PAST SURGERIES     TEE WITHOUT CARDIOVERSION N/A 01/07/2018   Procedure: TRANSESOPHAGEAL ECHOCARDIOGRAM (TEE);  Surgeon: Minna Merritts, MD;  Location: [facility] ORS;  Service: Cardiovascular;  Laterality: N/A;   Patient Active Problem List   Diagnosis Date Noted   Hydronephrosis, left 05/04/2022   Type II diabetes mellitus with renal manifestations ([place]) 05/04/2022   Leukocytosis  05/04/2022   Pre-op evaluation 04/24/2022   Headache 07/07/2021   Hearing loss 07/07/2021   Open wound 01/25/2021   History of colon polyps 10/15/2020   Postoperative hemorrhage involving digestive system following digestive system procedure 09/19/2020   Acute cholecystitis without calculus 09/09/2020   Type 2 diabetes mellitus, with long-term current use of insulin ([place]) 09/09/2020   Cryptogenic stroke ([place]) 07/13/2020   History of loop recorder 07/13/2020   Weakness 06/18/2020   History of 2019 novel coronavirus disease (COVID-19) 05/20/2020   ESRD (end stage renal disease) ([place]) 04/04/2020   Pneumonia due to COVID-19 virus 04/03/2020   AKI (acute kidney injury) ([place]) 04/03/2020   Elevated troponin 04/03/2020   Acquired trigger finger 06/08/2019   Lymphedema 06/08/2019   Anemia 04/17/2019   Swelling of left lower extremity 01/10/2019   Facial droop 04/09/2018   Daytime somnolence 03/30/2018   Carotid artery disease ([place]) 02/03/2018   Intracranial vascular stenosis 09/12/2017   Cough 01/20/2017   Bell's palsy 11/10/2016   History of CVA (cerebrovascular accident) 11/10/2016   Benign localized hyperplasia of prostate with urinary obstruction 10/27/2016   History of nephrolithiasis 10/27/2016   TIA (transient ischemic attack) 10/20/2016   Near syncope 06/23/2016   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   Neuropathy 08/06/2015   Health care maintenance 08/06/2015   Essential hypertension 08/06/2015   Heme positive stool 10/10/2013  Hypothyroidism 10/10/2013   Microalbuminuria 10/10/2013   Hyperlipidemia 10/10/2013   B12 deficiency 10/10/2013   Diabetes ([place]) 07/04/2013   Environmental allergies 07/04/2013   ONSET DATE: 2-3 years  REFERRING DIAG: Neuropathy  THERAPY DIAG:  Difficulty in walking, not elsewhere classified  Muscle weakness (generalized)  Unsteadiness on feet  Rationale for Evaluation and Treatment Rehabilitation  SUBJECTIVE:                                                                                                                                                                                              SUBJECTIVE STATEMENT: Patient reports he had his eye  injections today, is having a hard time with balance due to limited vision.   Pt accompanied by: self  PERTINENT HISTORY: Patient presents to physical therapy for neuropathy. He underwent banding of his L UE fistula on 05/13/22 and scheduled for additional banding on 06/07/22. 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 Patient reports his balance is very unsteady due to neuropathy in feet, feels weak in LE's.   PAIN:  Are you having pain? Yes: NPRS scale: 98/10 Pain location: L foot Pain description: sharp  Aggravating factors: worse at night Relieving factors: lyrica   PRECAUTIONS: Fall  WEIGHT BEARING RESTRICTIONS Yes no lifting >5 lb in arm   FALLS: Has patient fallen in last 6 months? No  LIVING ENVIRONMENT: Lives with: lives alone Lives in: House/apartment Stairs: Yes: Internal: flight steps; on right going up Has following equipment at home: Single point cane and Walker - 2 wheeled  PLOF: Independent  PATIENT GOALS to be more steady and walk as normally as possible.   OBJECTIVE:   LOWER EXTREMITY MMT:    MMT Right Eval Left Eval  Hip flexion 4- 3+  Hip extension    Hip abduction 4- 4+  Hip adduction 4- 3+  Knee flexion 4 4-  Knee extension 4 4  Ankle dorsiflexion 2+ 2+  Ankle plantarflexion 3+ 3+  (Blank rows = not tested)  FUNCTIONAL TESTs:  5 times sit to stand: 19.6 seconds with walking stick; one LOB 10 meter walk test: 12 seconds Berg Balance Scale: 33/56  PATIENT SURVEYS:  ABC scale 67% FOTO 53%  TODAY'S TREATMENT:   TherEx Nustep seat position 11, Lvl 6; 4  minutes for cardiovascular and musculoskeletal challenge  Seated with #7.5 ankle weights  -Seated marches with upright posture, back away from back of chair for abdominal/trunk activation/stabilization, 10x each LE; x 2sets -Seated LAQ with  3 second holds, 10x each LE, cueing for muscle activation and sequencing for neutral alignment; x 2 sets -Seated IR/ER with cueing for stabilizing knee placement with lateral foot movement for optimal muscle recruitment, 10x each LE; x 2 sets -seated heel raise 15x x2 sets  Seated: TrA activation 10x pressing into swiss ball with 3 second holds  TrA activation with swiss ball and UE raise 8x each arm  Scapular retractions 15x   PATIENT EDUCATION: Education details: HEP, goals, POC Person educated: Patient Education method: Explanation, Demonstration, Tactile cues, Verbal cues, and Handouts Education comprehension: verbalized understanding, returned demonstration, verbal cues required, and tactile cues required  HOME EXERCISE PROGRAM: Access Code: [email or web address] URL: [email or web address] Date: 06/03/2022 Prepared by: Janna Arch  Exercises - Seated Heel Toe Raises  - 1 x daily - 7 x weekly - 2 sets - 10 reps - 5 hold - Standing Tandem Balance with Counter Support  - 1 x daily - 7 x weekly - 2 sets - 2 reps - 30 hold - Standing March with Counter Support  - 1 x daily - 7 x weekly - 2 sets - 10 reps - 5 hold  GOALS: Goals reviewed with patient? Yes  SHORT TERM GOALS: Target date: 07/01/2022  Patient will be independent in home exercise program to improve strength/mobility for better functional independence with ADLs. Baseline:10/9; HEP given  Goal status: INITIAL  LONG TERM GOALS: Target date: 08/26/2022  Patient will increase FOTO score to equal to or greater than   63%  to demonstrate statistically significant improvement in mobility and quality of life.  Baseline: 10/9: 53% 11/29: 58% Goal status: IN PROGRESS  2.  Patient (< 77  years old) will complete five times sit to stand test in < 10 seconds without UE support indicating an increased LE strength and improved balance. Baseline: 10/9: 19.6 seconds with walking stick; one LOB 11/29: 20.56 seconds  Goal status: IN PROGRESS  3.  Patient will increase Berg Balance score by > 45/56 to demonstrate decreased fall risk during functional activities. Baseline: 10/9: 33/56 11/29: 43/56 Goal status: IN PROGRESS  4.  Patient will increase 10 meter walk test to >1.49ms as to improve gait speed for better community ambulation and to reduce fall risk. Baseline: 11/29: 1.19 m/s Goal status: MET  5.  Patient will increase ABC scale score >80% to demonstrate better functional mobility and better confidence with ADLs.  Baseline: 10/9: 67% 11/29: 75.6% Goal status: IN PROGRESS  6. Patient will tolerate 5 seconds of single leg stance without loss of balance to improve ability to get in and out of shower safely. Baseline: 11/29: unable to stand on single leg safely  Goal status: NEW  ASSESSMENT:  CLINICAL IMPRESSION: Patient has decreased vision today limiting his session. He is unable to see the blazepods for usage today. Focused on strengthening which is tolerated well despite patient having full body fatigue. The patient will continue to benefit from skilled therapy in order to continue to progress balance and overall functional capacity with ADLs.   OBJECTIVE IMPAIRMENTS Abnormal gait, decreased activity tolerance, decreased balance, decreased coordination, decreased endurance, decreased mobility, difficulty walking, decreased strength, impaired flexibility, impaired sensation, improper body mechanics, and pain.   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   REHAB POTENTIAL: Good  CLINICAL DECISION MAKING: Evolving/moderate complexity  EVALUATION COMPLEXITY: Moderate  PLAN: PT FREQUENCY: 2x/week  PT DURATION: 12 weeks  PLANNED INTERVENTIONS: Therapeutic exercises, Therapeutic activity, Neuromuscular re-education, Balance training, Gait training, Patient/Family education, Self Care, Joint mobilization, Stair training, Vestibular training, Canalith repositioning, Visual/preceptual remediation/compensation, DME instructions, Dry Needling, Cognitive remediation, Spinal mobilization, Cryotherapy, Moist heat, Manual lymph drainage, Compression bandaging, Taping, Vasopneumatic device, Ultrasound, Manual therapy, and Re-evaluation  PLAN FOR NEXT SESSION: Continue to progress dynamic balance and dual tasking balance. Work on single leg balance progressions.    Janna Arch, PT, DPT  [facility]  08/12/22, 3:29 PM

## 2022-08-12 ENCOUNTER — Ambulatory Visit: Payer: Medicare Other

## 2022-08-12 DIAGNOSIS — M6281 Muscle weakness (generalized): Secondary | ICD-10-CM

## 2022-08-12 DIAGNOSIS — R262 Difficulty in walking, not elsewhere classified: Secondary | ICD-10-CM | POA: Diagnosis not present

## 2022-08-12 DIAGNOSIS — R2681 Unsteadiness on feet: Secondary | ICD-10-CM

## 2022-08-13 NOTE — Therapy (Signed)
OUTPATIENT PHYSICAL THERAPY NEURO TREATMENT  Patient Name: Gregory Crane MRN: [id number] DOB:04-22-1971, 51 y.o., male Today's Date: 08/14/2022  PCP: [name] MD REFERRING PROVIDER: [name] MD  PT End of Session - 08/14/22 1019     Visit Number 15    Number of Visits 24    Date for PT Re-Evaluation 08/26/22    Authorization Type Medicare A&B; Medicaid; 11/29 PN    Authorization Time Period 06/24/22-12/121/23 for 12 visits    Authorization - Number of Visits 12    Progress Note Due on Visit 10    PT Start Time 1015    PT Stop Time 1059    PT Time Calculation (min) 44 min    Equipment Utilized During Treatment Gait belt    Activity Tolerance Patient tolerated treatment well;No increased pain;Patient limited by fatigue    Behavior During Therapy [facility] for tasks assessed/performed              Past Medical History:  Diagnosis Date   Allergy    Anemia    Bell's palsy    Diabetes mellitus without complication ([place])    diet controlled   Hypertension    Hypothyroidism    Kidney stones    Pseudotumor cerebri    Stroke [facility])    Past Surgical History:  Procedure Laterality Date   COLONOSCOPY WITH PROPOFOL N/A 03/30/2020   Procedure: COLONOSCOPY WITH PROPOFOL;  Surgeon: Lesly Rubenstein, MD;  Location: [facility] ENDOSCOPY;  Service: Endoscopy;  Laterality: N/A;   LOOP RECORDER INSERTION N/A 01/27/2018   Procedure: LOOP RECORDER INSERTION;  Surgeon: [name], MD;  Location: [place] [facility];  Service: Cardiovascular;  Laterality: N/A;   LUMBAR PUNCTURE     as child   NO PAST SURGERIES     TEE WITHOUT CARDIOVERSION N/A 01/07/2018   Procedure: TRANSESOPHAGEAL ECHOCARDIOGRAM (TEE);  Surgeon: Minna Merritts, MD;  Location: [facility] ORS;  Service: Cardiovascular;  Laterality: N/A;   Patient Active Problem List   Diagnosis Date Noted   Hydronephrosis, left 05/04/2022   Type II diabetes mellitus with renal manifestations ([place]) 05/04/2022   Leukocytosis  05/04/2022   Pre-op evaluation 04/24/2022   Headache 07/07/2021   Hearing loss 07/07/2021   Open wound 01/25/2021   History of colon polyps 10/15/2020   Postoperative hemorrhage involving digestive system following digestive system procedure 09/19/2020   Acute cholecystitis without calculus 09/09/2020   Type 2 diabetes mellitus, with long-term current use of insulin ([place]) 09/09/2020   Cryptogenic stroke ([place]) 07/13/2020   History of loop recorder 07/13/2020   Weakness 06/18/2020   History of 2019 novel coronavirus disease (COVID-19) 05/20/2020   ESRD (end stage renal disease) ([place]) 04/04/2020   Pneumonia due to COVID-19 virus 04/03/2020   AKI (acute kidney injury) ([place]) 04/03/2020   Elevated troponin 04/03/2020   Acquired trigger finger 06/08/2019   Lymphedema 06/08/2019   Anemia 04/17/2019   Swelling of left lower extremity 01/10/2019   Facial droop 04/09/2018   Daytime somnolence 03/30/2018   Carotid artery disease ([place]) 02/03/2018   Intracranial vascular stenosis 09/12/2017   Cough 01/20/2017   Bell's palsy 11/10/2016   History of CVA (cerebrovascular accident) 11/10/2016   Benign localized hyperplasia of prostate with urinary obstruction 10/27/2016   History of nephrolithiasis 10/27/2016   TIA (transient ischemic attack) 10/20/2016   Near syncope 06/23/2016   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   Neuropathy 08/06/2015   Health care maintenance 08/06/2015   Essential hypertension 08/06/2015   Heme positive stool  10/10/2013   Hypothyroidism 10/10/2013   Microalbuminuria 10/10/2013   Hyperlipidemia 10/10/2013   B12 deficiency 10/10/2013   Diabetes ([place]) 07/04/2013   Environmental allergies 07/04/2013   ONSET DATE: 2-3 years  REFERRING DIAG: Neuropathy  THERAPY DIAG:  Difficulty in walking, not elsewhere classified  Muscle weakness (generalized)  Unsteadiness on feet  Abnormality of gait and mobility  Rationale for Evaluation and Treatment  Rehabilitation  SUBJECTIVE:                                                                                                                                                                                             SUBJECTIVE STATEMENT: Patient reports he had dialysis yesterday. Is still feeling unsteady today.   Pt accompanied by: self  PERTINENT HISTORY: Patient presents to physical therapy for neuropathy. He underwent banding of his L UE fistula on 05/13/22 and scheduled for additional banding on 06/07/22. 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 Patient reports his balance is very unsteady due to neuropathy in feet, feels weak in LE's.   PAIN:  Are you having pain? Yes: NPRS scale: 98/10 Pain location: L foot Pain description: sharp  Aggravating factors: worse at night Relieving factors: lyrica   PRECAUTIONS: Fall  WEIGHT BEARING RESTRICTIONS Yes no lifting >5 lb in arm   FALLS: Has patient fallen in last 6 months? No  LIVING ENVIRONMENT: Lives with: lives alone Lives in: House/apartment Stairs: Yes: Internal: flight steps; on right going up Has following equipment at home: Single point cane and Walker - 2 wheeled  PLOF: Independent  PATIENT GOALS to be more steady and walk as normally as possible.   OBJECTIVE:   LOWER EXTREMITY MMT:    MMT Right Eval Left Eval  Hip flexion 4- 3+  Hip extension    Hip abduction 4- 4+  Hip adduction 4- 3+  Knee flexion 4 4-  Knee extension 4 4  Ankle dorsiflexion 2+ 2+  Ankle plantarflexion 3+ 3+  (Blank rows = not tested)  FUNCTIONAL TESTs:  5 times sit to stand: 19.6 seconds with walking stick; one LOB 10 meter walk test: 12 seconds Berg Balance Scale: 33/56  PATIENT SURVEYS:  ABC scale 67% FOTO 53%  TODAY'S TREATMENT:    TherEx Nustep seat position 11, Lvl 4-6; 4 minutes for cardiovascular and musculoskeletal challenge  6" step: -toe taps 10x each LE - step up/down 10x each LE with UE support; more challenging for LLE -lateral step up/down 10x each LE BUE support  Seated: Dynadisc hamstring isometric contraction 10x 3 second holds  Half foam roller df/pf 20x  Seated on dynadisc: -static sit 30 seconds -UE raise 10x -LE LAQ 10x each LE -opposite UE/LE raises ; very challenging 10x   Neuro Re-ed: Airex pad: -static stand 30 seconds x 2 trials  -horizontal head turns 30 seconds -vertical head turns 30 seconds -GTB row 15x   PATIENT EDUCATION: Education details: HEP, goals, POC Person educated: Patient Education method: Explanation, Demonstration, Tactile cues, Verbal cues, and Handouts Education comprehension: verbalized understanding, returned demonstration, verbal cues required, and tactile cues required  HOME EXERCISE PROGRAM: Access Code: [email or web address] URL: [email or web address] Date: 06/03/2022 Prepared by: Janna Arch  Exercises - Seated Heel Toe Raises  - 1 x daily - 7 x weekly - 2 sets - 10 reps - 5 hold - Standing Tandem Balance with Counter Support  - 1 x daily - 7 x weekly - 2 sets - 2 reps - 30 hold - Standing March with Counter Support  - 1 x daily - 7 x weekly - 2 sets - 10 reps - 5 hold  GOALS: Goals reviewed with patient? Yes  SHORT TERM GOALS: Target date: 07/01/2022  Patient will be independent in home exercise program to improve strength/mobility for better functional independence with ADLs. Baseline:10/9; HEP given  Goal status: INITIAL  LONG TERM GOALS: Target date: 08/26/2022  Patient will increase FOTO score to equal to or greater than   63%  to demonstrate statistically significant improvement in mobility and quality of life.  Baseline: 10/9: 53% 11/29: 58% Goal status: IN PROGRESS  2.  Patient (< 53 years old) will complete five times sit to  stand test in < 10 seconds without UE support indicating an increased LE strength and improved balance. Baseline: 10/9: 19.6 seconds with walking stick; one LOB 11/29: 20.56 seconds  Goal status: IN PROGRESS  3.  Patient will increase Berg Balance score by > 45/56 to demonstrate decreased fall risk during functional activities. Baseline: 10/9: 33/56 11/29: 43/56 Goal status: IN PROGRESS  4.  Patient will increase 10 meter walk test to >1.52ms as to improve gait speed for better community ambulation and to reduce fall risk. Baseline: 11/29: 1.19 m/s Goal status: MET  5.  Patient will increase ABC scale score >80% to demonstrate better functional mobility and better confidence with ADLs.  Baseline: 10/9: 67% 11/29: 75.6% Goal status: IN PROGRESS  6. Patient will tolerate 5 seconds of single leg stance without loss of balance to improve ability to get in and out of shower safely. Baseline: 11/29: unable to stand on single leg safely  Goal status: NEW  ASSESSMENT:  CLINICAL IMPRESSION: Patient tolerates ankle righting reaction interventions well in addition to core stabilization interventions on dynadisc. He is more challenged with LLE activation and stabilization but remains highly motivated throughout session.  He does have improved stability and spatial awareness at end of session compared to beginning of session indicating improved body mechanics. The patient will continue to benefit from skilled therapy in order to continue to progress balance and overall functional capacity with ADLs.   OBJECTIVE IMPAIRMENTS Abnormal gait, decreased activity tolerance, decreased balance, decreased coordination, decreased endurance, decreased mobility, difficulty walking, decreased strength, impaired flexibility, impaired sensation, improper body mechanics, and pain.   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   REHAB POTENTIAL: Good  CLINICAL DECISION MAKING: Evolving/moderate complexity  EVALUATION COMPLEXITY: Moderate  PLAN: PT FREQUENCY: 2x/week  PT DURATION: 12 weeks  PLANNED INTERVENTIONS: Therapeutic exercises, Therapeutic activity, Neuromuscular re-education, Balance training, Gait training, Patient/Family education, Self Care, Joint mobilization, Stair training, Vestibular training, Canalith repositioning, Visual/preceptual remediation/compensation, DME instructions, Dry Needling, Cognitive remediation, Spinal mobilization, Cryotherapy, Moist heat, Manual lymph drainage, Compression bandaging, Taping, Vasopneumatic device, Ultrasound, Manual therapy, and Re-evaluation  PLAN FOR NEXT SESSION: Continue to progress dynamic balance and dual tasking balance. Work on single leg balance progressions.    Janna Arch, PT, DPT  [facility]  08/14/22, 11:02 AM

## 2022-08-14 ENCOUNTER — Ambulatory Visit: Payer: Medicare Other

## 2022-08-14 DIAGNOSIS — R262 Difficulty in walking, not elsewhere classified: Secondary | ICD-10-CM

## 2022-08-14 DIAGNOSIS — R2681 Unsteadiness on feet: Secondary | ICD-10-CM

## 2022-08-14 DIAGNOSIS — R269 Unspecified abnormalities of gait and mobility: Secondary | ICD-10-CM

## 2022-08-14 DIAGNOSIS — M6281 Muscle weakness (generalized): Secondary | ICD-10-CM

## 2022-08-20 NOTE — Therapy (Signed)
OUTPATIENT PHYSICAL THERAPY NEURO TREATMENT/RECERT  Patient Name: Gregory Crane MRN: [id number] DOB:[birth year], 51 y.o., male Today's Date: 08/21/2022  PCP: [name] MD REFERRING PROVIDER: [name] MD  PT End of Session - 08/21/22 1020     Visit Number 16    Number of Visits 40    Date for PT Re-Evaluation 11/13/22    Authorization Type Medicare A&B; Medicaid; 11/29 PN    Authorization Time Period 06/24/22-12/121/23 for 12 visits    Authorization - Number of Visits 12    Progress Note Due on Visit 10    PT Start Time 1020    PT Stop Time 1100    PT Time Calculation (min) 40 min    Equipment Utilized During Treatment Gait belt    Activity Tolerance Patient tolerated treatment well;No increased pain;Patient limited by fatigue    Behavior During Therapy [facility] for tasks assessed/performed               Past Medical History:  Diagnosis Date   Allergy    Anemia    Bell's palsy    Diabetes mellitus without complication ([place])    diet controlled   Hypertension    Hypothyroidism    Kidney stones    Pseudotumor cerebri    Stroke [facility])    Past Surgical History:  Procedure Laterality Date   COLONOSCOPY WITH PROPOFOL N/A 03/30/2020   Procedure: COLONOSCOPY WITH PROPOFOL;  Surgeon: Lesly Rubenstein, MD;  Location: [facility] ENDOSCOPY;  Service: Endoscopy;  Laterality: N/A;   LOOP RECORDER INSERTION N/A 01/27/2018   Procedure: LOOP RECORDER INSERTION;  Surgeon: [name], MD;  Location: [place] [facility];  Service: Cardiovascular;  Laterality: N/A;   LUMBAR PUNCTURE     as child   NO PAST SURGERIES     TEE WITHOUT CARDIOVERSION N/A 01/07/2018   Procedure: TRANSESOPHAGEAL ECHOCARDIOGRAM (TEE);  Surgeon: Minna Merritts, MD;  Location: [facility] ORS;  Service: Cardiovascular;  Laterality: N/A;   Patient Active Problem List   Diagnosis Date Noted   Hydronephrosis, left 05/04/2022   Type II diabetes mellitus with renal manifestations ([place]) 05/04/2022    Leukocytosis 05/04/2022   Pre-op evaluation 04/24/2022   Headache 07/07/2021   Hearing loss 07/07/2021   Open wound 01/25/2021   History of colon polyps 10/15/2020   Postoperative hemorrhage involving digestive system following digestive system procedure 09/19/2020   Acute cholecystitis without calculus 09/09/2020   Type 2 diabetes mellitus, with long-term current use of insulin ([place]) 09/09/2020   Cryptogenic stroke ([place]) 07/13/2020   History of loop recorder 07/13/2020   Weakness 06/18/2020   History of 2019 novel coronavirus disease (COVID-19) 05/20/2020   ESRD (end stage renal disease) ([place]) 04/04/2020   Pneumonia due to COVID-19 virus 04/03/2020   AKI (acute kidney injury) ([place]) 04/03/2020   Elevated troponin 04/03/2020   Acquired trigger finger 06/08/2019   Lymphedema 06/08/2019   Anemia 04/17/2019   Swelling of left lower extremity 01/10/2019   Facial droop 04/09/2018   Daytime somnolence 03/30/2018   Carotid artery disease ([place]) 02/03/2018   Intracranial vascular stenosis 09/12/2017   Cough 01/20/2017   Bell's palsy 11/10/2016   History of CVA (cerebrovascular accident) 11/10/2016   Benign localized hyperplasia of prostate with urinary obstruction 10/27/2016   History of nephrolithiasis 10/27/2016   TIA (transient ischemic attack) 10/20/2016   Near syncope 06/23/2016   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   Neuropathy 08/06/2015   Health care maintenance 08/06/2015   Essential hypertension 08/06/2015   Heme positive  stool 10/10/2013   Hypothyroidism 10/10/2013   Microalbuminuria 10/10/2013   Hyperlipidemia 10/10/2013   B12 deficiency 10/10/2013   Diabetes ([place]) 07/04/2013   Environmental allergies 07/04/2013   ONSET DATE: 2-3 years  REFERRING DIAG: Neuropathy  THERAPY DIAG:  Difficulty in walking, not elsewhere classified  Muscle weakness (generalized)  Unsteadiness on feet  Rationale for Evaluation and Treatment Rehabilitation  SUBJECTIVE:                                                                                                                                                                                              SUBJECTIVE STATEMENT: Patient reports he has not been feeling well, has been sick and still is recovering.   Pt accompanied by: self  PERTINENT HISTORY: Patient presents to physical therapy for neuropathy. He underwent banding of his L UE fistula on 05/13/22 and scheduled for additional banding on 06/07/22. 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 Patient reports his balance is very unsteady due to neuropathy in feet, feels weak in LE's.   PAIN:  Are you having pain? Yes: NPRS scale: 98/10 Pain location: L foot Pain description: sharp  Aggravating factors: worse at night Relieving factors: lyrica   PRECAUTIONS: Fall  WEIGHT BEARING RESTRICTIONS Yes no lifting >5 lb in arm   FALLS: Has patient fallen in last 6 months? No  LIVING ENVIRONMENT: Lives with: lives alone Lives in: House/apartment Stairs: Yes: Internal: flight steps; on right going up Has following equipment at home: Single point cane and Walker - 2 wheeled  PLOF: Independent  PATIENT GOALS to be more steady and walk as normally as possible.   OBJECTIVE:   LOWER EXTREMITY MMT:    MMT Right Eval Left Eval  Hip flexion 4- 3+  Hip extension    Hip abduction 4- 4+  Hip adduction 4- 3+  Knee flexion 4 4-  Knee extension 4 4  Ankle dorsiflexion 2+ 2+  Ankle plantarflexion 3+ 3+  (Blank rows = not tested)  FUNCTIONAL TESTs:  5 times sit to stand: 19.6 seconds with walking stick; one LOB 10 meter walk test: 12 seconds Berg Balance Scale: 33/56  PATIENT SURVEYS:  ABC scale 67% FOTO 53%  TODAY'S TREATMENT:   Goals:  See below for details    PATIENT  EDUCATION: Education details: HEP, goals, POC Person educated: Patient Education method: Explanation, Demonstration, Tactile cues, Verbal cues, and Handouts Education comprehension: verbalized understanding, returned demonstration, verbal cues required, and tactile cues required  HOME  EXERCISE PROGRAM: Access Code: [email or web address] URL: [email or web address] Date: 06/03/2022 Prepared by: Janna Arch  Exercises - Seated Heel Toe Raises  - 1 x daily - 7 x weekly - 2 sets - 10 reps - 5 hold - Standing Tandem Balance with Counter Support  - 1 x daily - 7 x weekly - 2 sets - 2 reps - 30 hold - Standing March with Counter Support  - 1 x daily - 7 x weekly - 2 sets - 10 reps - 5 hold  GOALS: Goals reviewed with patient? Yes  SHORT TERM GOALS: Target date: 07/01/2022  Patient will be independent in home exercise program to improve strength/mobility for better functional independence with ADLs. Baseline:10/9; HEP given  Goal status: INITIAL  LONG TERM GOALS: Target date: 11/13/2022    Patient will increase FOTO score to equal to or greater than   63%  to demonstrate statistically significant improvement in mobility and quality of life.  Baseline: 10/9: 53% 11/29: 58% 12/27: 51%  Goal status: IN PROGRESS  2.  Patient (< 3 years old) will complete five times sit to stand test in < 10 seconds without UE support indicating an increased LE strength and improved balance. Baseline: 10/9: 19.6 seconds with walking stick; one LOB 11/29: 20.56 seconds 12/27: 13.4 seconds hands on knees  Goal status: IN PROGRESS  3.  Patient will increase Berg Balance score by > 45/56 to demonstrate decreased fall risk during functional activities. Baseline: 10/9: 33/56 11/29: 43/56 12/27: 44/56  Goal status: IN PROGRESS  4.  Patient will increase 10 meter walk test to >1.52ms as to improve gait speed for better community ambulation and to reduce fall risk. Baseline: 11/29: 1.19 m/s Goal status:  MET  5.  Patient will increase ABC scale score >80% to demonstrate better functional mobility and better confidence with ADLs.  Baseline: 10/9: 67% 11/29: 75.6% 12/27: 78%  Goal status: IN PROGRESS  6. Patient will tolerate 5 seconds of single leg stance without loss of balance to improve ability to get in and out of shower safely. Baseline: 11/29: unable to stand on single leg safely 12/27: 2 seconds Goal status: In progress   6. Patient will increase six minute walk test distance to >1000 for progression to community ambulator and improve gait ability Baseline: 12/27: 845 ft with with walking stick  Goal status: NEW    ASSESSMENT:  CLINICAL IMPRESSION: Patient demonstrates excellent progress towards functional goals. New goal addressing ambulation duration added to POC to focus on patient capacity for functional mobility. Patient's transfers are significantly improving as can be seen in sit to stand. Single limb stability continues to be highly limited and will be an area of focus in next round of therapy in addition to ambulation focus. Patient is highly motivated and has good prognosis.  The patient will continue to benefit from skilled therapy in order to continue to progress balance and overall functional capacity with ADLs.   OBJECTIVE IMPAIRMENTS Abnormal gait, decreased activity tolerance, decreased balance, decreased coordination, decreased endurance, decreased mobility, difficulty walking, decreased strength, impaired flexibility, impaired sensation, improper body mechanics, and pain.   ACTIVITY LIMITATIONS carrying, lifting, bending, standing, squatting, sleeping, stairs, transfers, bed mobility, bathing, toileting, dressing, locomotion level, and caring for others  PARTICIPATION LIMITATIONS: meal prep, cleaning, laundry, medication management, personal finances, interpersonal relationship, driving, shopping, community activity, and yard work  PERSONAL FACTORS Age, Fitness,  Past/current experiences, Time since onset of injury/illness/exacerbation, Transportation, and 3+ comorbidities: CAD, cryptogenic stroke (2021), deafness in R  ear, diabetic retinopathy, dialysis, ESRD, COVID, HTN, hypothyroidism, leukocytosis, nephrolithiasis, neuropathy, Pseudotumor cerebri 1986, sepsis, DM type I, DM type II  are also affecting patient's functional outcome.   REHAB POTENTIAL: Good  CLINICAL DECISION MAKING: Evolving/moderate complexity  EVALUATION COMPLEXITY: Moderate  PLAN: PT FREQUENCY: 2x/week  PT DURATION: 12 weeks  PLANNED INTERVENTIONS: Therapeutic exercises, Therapeutic activity, Neuromuscular re-education, Balance training, Gait training, Patient/Family education, Self Care, Joint mobilization, Stair training, Vestibular training, Canalith repositioning, Visual/preceptual remediation/compensation, DME instructions, Dry Needling, Cognitive remediation, Spinal mobilization, Cryotherapy, Moist heat, Manual lymph drainage, Compression bandaging, Taping, Vasopneumatic device, Ultrasound, Manual therapy, and Re-evaluation  PLAN FOR NEXT SESSION: Continue to progress dynamic balance and dual tasking balance. Work on single leg balance progressions.    Janna Arch, PT, DPT  [facility]  08/21/22, 10:57 AM

## 2022-08-21 ENCOUNTER — Ambulatory Visit: Payer: Medicare Other

## 2022-08-21 ENCOUNTER — Other Ambulatory Visit: Payer: Self-pay | Admitting: Internal Medicine

## 2022-08-21 DIAGNOSIS — R262 Difficulty in walking, not elsewhere classified: Secondary | ICD-10-CM | POA: Diagnosis not present

## 2022-08-21 DIAGNOSIS — R2681 Unsteadiness on feet: Secondary | ICD-10-CM

## 2022-08-21 DIAGNOSIS — M6281 Muscle weakness (generalized): Secondary | ICD-10-CM

## 2022-08-22 ENCOUNTER — Emergency Department: Payer: Medicare Other

## 2022-08-22 ENCOUNTER — Emergency Department
Admission: EM | Admit: 2022-08-22 | Discharge: 2022-08-22 | Disposition: A | Discharge status: Home / Self Care | Payer: Medicare Other | Attending: Emergency Medicine | Admitting: Emergency Medicine

## 2022-08-22 ENCOUNTER — Encounter: Payer: Self-pay | Admitting: Emergency Medicine

## 2022-08-22 DIAGNOSIS — N200 Calculus of kidney: Secondary | ICD-10-CM | POA: Insufficient documentation

## 2022-08-22 DIAGNOSIS — R109 Unspecified abdominal pain: Secondary | ICD-10-CM | POA: Diagnosis present

## 2022-08-22 LAB — URINALYSIS, ROUTINE W REFLEX MICROSCOPIC
Bilirubin Urine: NEGATIVE
Glucose, UA: >=500 mg/dL — AB
Ketones, ur: NEGATIVE mg/dL
Leukocytes,Ua: NEGATIVE
Nitrite: NEGATIVE
Protein, ur: >=300 mg/dL — AB
RBC / HPF: >50 RBC/hpf — ABNORMAL HIGH (ref 0–5)
Specific Gravity, Urine: 1.012 (ref 1.005–1.030)
pH: 5 (ref 5.0–8.0)

## 2022-08-22 LAB — CBC WITH DIFFERENTIAL/PLATELET
Abs Immature Granulocytes: 0.09 10*3/uL — ABNORMAL HIGH (ref 0.00–0.07)
Basophils Absolute: 0 10*3/uL (ref 0.0–0.1)
Basophils Relative: 0 %
Eosinophils Absolute: 0 10*3/uL (ref 0.0–0.5)
Eosinophils Relative: 0 %
HCT: 37.2 % — ABNORMAL LOW (ref 39.0–52.0)
Hemoglobin: 12.2 g/dL — ABNORMAL LOW (ref 13.0–17.0)
Immature Granulocytes: 1 %
Lymphocytes Relative: 5 %
Lymphs Abs: 0.6 10*3/uL — ABNORMAL LOW (ref 0.7–4.0)
MCH: 30.2 pg (ref 26.0–34.0)
MCHC: 32.8 g/dL (ref 30.0–36.0)
MCV: 92.1 fL (ref 80.0–100.0)
Monocytes Absolute: 0.4 10*3/uL (ref 0.1–1.0)
Monocytes Relative: 4 %
Neutro Abs: 10.3 10*3/uL — ABNORMAL HIGH (ref 1.7–7.7)
Neutrophils Relative %: 90 %
Platelets: 273 10*3/uL (ref 150–400)
RBC: 4.04 MIL/uL — ABNORMAL LOW (ref 4.22–5.81)
RDW: 13 % (ref 11.5–15.5)
WBC: 11.4 10*3/uL — ABNORMAL HIGH (ref 4.0–10.5)
nRBC: 0 % (ref 0.0–0.2)

## 2022-08-22 LAB — COMPREHENSIVE METABOLIC PANEL
ALT: 13 U/L (ref 0–44)
AST: 16 U/L (ref 15–41)
Albumin: 3.9 g/dL (ref 3.5–5.0)
Alkaline Phosphatase: 90 U/L (ref 38–126)
Anion gap: 13 (ref 5–15)
BUN: 55 mg/dL — ABNORMAL HIGH (ref 6–20)
CO2: 21 mmol/L — ABNORMAL LOW (ref 22–32)
Calcium: 8.4 mg/dL — ABNORMAL LOW (ref 8.9–10.3)
Chloride: 105 mmol/L (ref 98–111)
Creatinine, Ser: 6.05 mg/dL — ABNORMAL HIGH (ref 0.61–1.24)
GFR, Estimated: 10 mL/min — ABNORMAL LOW (ref >60)
Glucose, Bld: 216 mg/dL — ABNORMAL HIGH (ref 70–99)
Potassium: 4.1 mmol/L (ref 3.5–5.1)
Sodium: 139 mmol/L (ref 135–145)
Total Bilirubin: 1.1 mg/dL (ref 0.3–1.2)
Total Protein: 8.1 g/dL (ref 6.5–8.1)

## 2022-08-22 NOTE — Discharge Instructions (Addendum)
Please go to dialysis today as we discussed

## 2022-08-22 NOTE — ED Triage Notes (Signed)
Pt presents via POV with complaints of left sided lower back pain that started 1.5 hours ago. Seen for same sx previously. Of note, pt is a dialysis patient (tues/thurs/sat) - last treatment was on Saturday. Missed Tuesday due to a cold. Denies CP or SOB.

## 2022-08-22 NOTE — ED Provider Notes (Signed)
[facility] Provider Note    Event Date/Time   First MD Initiated Contact with Patient 08/22/22 [phone]     (approximate)   History   Back Pain   HPI  Gregory Crane is a 51 y.o. male who presents with complaints of left-sided flank pain which he reports was severe and caused him to have nausea and vomiting.  He reports the pain has now resolved after several hours.  He feels much better.  No dysuria.  No fevers or chills.     Physical Exam   Triage Vital Signs: ED Triage Vitals  Enc Vitals Group     BP 08/22/22 0037 (!) 180/68     Pulse Rate 08/22/22 0037 88     Resp 08/22/22 0037 18     Temp 08/22/22 0037 98.3 F (36.8 C)     Temp Source 08/22/22 0037 Oral     SpO2 08/22/22 0037 100 %     Weight 08/22/22 0036 113.4 kg (250 lb)     Height 08/22/22 0036 1.829 m (6')     Head Circumference --      Peak Flow --      Pain Score 08/22/22 0036 6     Pain Loc --      Pain Edu? --      Excl. in [place]? --     Most recent vital signs: Vitals:   08/22/22 0416 08/22/22 0731  BP: (!) 179/76 (!) 166/81  Pulse: 88 82  Resp: 18 18  Temp: 98.6 F (37 C) 98.5 F (36.9 C)  SpO2: 95% 95%     General: Awake, no distress.  CV:  Good peripheral perfusion. Resp:  Normal effort.  Abd:  No distention.  No CVA tenderness Other:     ED Results / Procedures / Treatments   Labs (all labs ordered are listed, but only abnormal results are displayed) Labs Reviewed  CBC WITH DIFFERENTIAL/PLATELET - Abnormal; Notable for the following components:      Result Value   WBC 11.4 (*)    RBC 4.04 (*)    Hemoglobin 12.2 (*)    HCT 37.2 (*)    Neutro Abs 10.3 (*)    Lymphs Abs 0.6 (*)    Abs Immature Granulocytes 0.09 (*)    All other components within normal limits  COMPREHENSIVE METABOLIC PANEL - Abnormal; Notable for the following components:   CO2 21 (*)    Glucose, Bld 216 (*)    BUN 55 (*)    Creatinine, Ser 6.05 (*)    Calcium 8.4 (*)    GFR,  Estimated 10 (*)    All other components within normal limits  URINALYSIS, ROUTINE W REFLEX MICROSCOPIC - Abnormal; Notable for the following components:   Color, Urine YELLOW (*)    APPearance CLOUDY (*)    Glucose, UA >=500 (*)    Hgb urine dipstick SMALL (*)    Protein, ur >=300 (*)    RBC / HPF >50 (*)    Bacteria, UA RARE (*)    All other components within normal limits  RESP PANEL BY RT-PCR (RSV, FLU A&B, COVID)  [id number]     EKG     RADIOLOGY CT renal stone study viewed by me, small stone left UVJ    PROCEDURES:  Critical Care performed:   Procedures   MEDICATIONS ORDERED IN ED: Medications - No data to display   IMPRESSION / MDM / [place] / ED COURSE  I reviewed the triage vital signs and the nursing notes. Patient's presentation is most consistent with acute presentation with potential threat to life or bodily function.   Patient presents with complaints of left flank pain as detailed above.  Differential includes UTI, ureterolithiasis, pyelonephritis.  Overall well-appearing  No CVA tenderness.,  Mild nonspecific elevation of white blood cell count, otherwise lab work is not significantly changed from prior.  CT scan is consistent with 1 to 2 mm UVJ stone on the left likely the cause of his pain.  That he is totally asymptomatic without pain medication I suspect he has passed the stone will refer to urology, return precautions discussed, urinalysis reviewed, not consistent with UTI.  He knows to return to the ED if any fevers.       FINAL CLINICAL IMPRESSION(S) / ED DIAGNOSES   Final diagnoses:  Kidney stone     Rx / DC Orders   ED Discharge Orders     None        Note:  This document was prepared using Dragon voice recognition software and may include unintentional dictation errors.   Lavonia Drafts, MD 08/22/22 [phone]

## 2022-08-22 NOTE — ED Notes (Signed)
Patient discharged to home per MD order. Patient in stable condition, and deemed medically cleared by ED provider for discharge. Discharge instructions reviewed with patient/family using "Teach Back"; verbalized understanding of medication education and administration, and information about follow-up care. Denies further concerns. ° °

## 2022-08-27 NOTE — Therapy (Signed)
OUTPATIENT PHYSICAL THERAPY NEURO TREATMENT/RECERT  Patient Name: Gregory Crane MRN: [id number] DOB:May 14, 1971, 52 y.o., male Today's Date: 08/27/2022  PCP: [name] MD REFERRING PROVIDER: [name] MD      Past Medical History:  Diagnosis Date   Allergy    Anemia    Bell's palsy    Diabetes mellitus without complication ([place])    diet controlled   Hypertension    Hypothyroidism    Kidney stones    Pseudotumor cerebri    Stroke [facility] [place] [facility])    Past Surgical History:  Procedure Laterality Date   COLONOSCOPY WITH PROPOFOL N/A 03/30/2020   Procedure: COLONOSCOPY WITH PROPOFOL;  Surgeon: Lesly Rubenstein, MD;  Location: [facility] ENDOSCOPY;  Service: Endoscopy;  Laterality: N/A;   LOOP RECORDER INSERTION N/A 01/27/2018   Procedure: LOOP RECORDER INSERTION;  Surgeon: [name], MD;  Location: [place] [facility] LAB;  Service: Cardiovascular;  Laterality: N/A;   LUMBAR PUNCTURE     as child   NO PAST SURGERIES     TEE WITHOUT CARDIOVERSION N/A 01/07/2018   Procedure: TRANSESOPHAGEAL ECHOCARDIOGRAM (TEE);  Surgeon: Minna Merritts, MD;  Location: [facility] ORS;  Service: Cardiovascular;  Laterality: N/A;   Patient Active Problem List   Diagnosis Date Noted   Hydronephrosis, left 05/04/2022   Type II diabetes mellitus with renal manifestations ([place]) 05/04/2022   Leukocytosis 05/04/2022   Pre-op evaluation 04/24/2022   Headache 07/07/2021   Hearing loss 07/07/2021   Open wound 01/25/2021   History of colon polyps 10/15/2020   Postoperative hemorrhage involving digestive system following digestive system procedure 09/19/2020   Acute cholecystitis without calculus 09/09/2020   Type 2 diabetes mellitus, with long-term current use of insulin ([place]) 09/09/2020   Cryptogenic stroke ([place]) 07/13/2020   History of loop recorder 07/13/2020   Weakness 06/18/2020   History of 2019 novel coronavirus disease (COVID-19) 05/20/2020   ESRD (end stage renal disease) ([place]) 04/04/2020    Pneumonia due to COVID-19 virus 04/03/2020   AKI (acute kidney injury) ([place]) 04/03/2020   Elevated troponin 04/03/2020   Acquired trigger finger 06/08/2019   Lymphedema 06/08/2019   Anemia 04/17/2019   Swelling of left lower extremity 01/10/2019   Facial droop 04/09/2018   Daytime somnolence 03/30/2018   Carotid artery disease ([place]) 02/03/2018   Intracranial vascular stenosis 09/12/2017   Cough 01/20/2017   Bell's palsy 11/10/2016   History of CVA (cerebrovascular accident) 11/10/2016   Benign localized hyperplasia of prostate with urinary obstruction 10/27/2016   History of nephrolithiasis 10/27/2016   TIA (transient ischemic attack) 10/20/2016   Near syncope 06/23/2016   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  Rationale for Evaluation and Treatment Rehabilitation  SUBJECTIVE:  SUBJECTIVE STATEMENT: Patient has been to the ED for kidney stones since last visit.   Pt accompanied by: self  PERTINENT HISTORY: Patient presents to physical therapy for neuropathy. He underwent banding of his L UE fistula on 05/13/22 and scheduled for additional banding on 06/07/22. 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 Patient reports his  balance is very unsteady due to neuropathy in feet, feels weak in LE's.   PAIN:  Are you having pain? Yes: NPRS scale: 98/10 Pain location: L foot Pain description: sharp  Aggravating factors: worse at night Relieving factors: lyrica   PRECAUTIONS: Fall  WEIGHT BEARING RESTRICTIONS Yes no lifting >5 lb in arm   FALLS: Has patient fallen in last 6 months? No  LIVING ENVIRONMENT: Lives with: lives alone Lives in: House/apartment Stairs: Yes: Internal: flight steps; on right going up Has following equipment at home: Single point cane and Walker - 2 wheeled  PLOF: Independent  PATIENT GOALS to be more steady and walk as normally as possible.   OBJECTIVE:   LOWER EXTREMITY MMT:    MMT Right Eval Left Eval  Hip flexion 4- 3+  Hip extension    Hip abduction 4- 4+  Hip adduction 4- 3+  Knee flexion 4 4-  Knee extension 4 4  Ankle dorsiflexion 2+ 2+  Ankle plantarflexion 3+ 3+  (Blank rows = not tested)  FUNCTIONAL TESTs:  5 times sit to stand: 19.6 seconds with walking stick; one LOB 10 meter walk test: 12 seconds Berg Balance Scale: 33/56  PATIENT SURVEYS:  ABC scale 67% FOTO 53%  TODAY'S TREATMENT:   TherEx Nustep seat position 11, Lvl 4-6; 4 minutes for cardiovascular and musculoskeletal challenge   6" step: -toe taps 10x each LE - step up/down 10x each LE with UE support; more challenging for LLE -lateral step up/down 10x each LE BUE support   Seated: Dynadisc hamstring isometric contraction 10x 3 second holds  Half foam roller df/pf 20x   Seated on dynadisc: -static sit 30 seconds -UE raise 10x -LE LAQ 10x each LE -opposite UE/LE raises ; very challenging 10x    Neuro Re-ed: Airex pad: -static stand 30 seconds x 2 trials  -horizontal head turns 30 seconds -vertical head turns 30 seconds -GTB row 15x     PATIENT EDUCATION: Education details: HEP, goals, POC Person educated: Patient Education method: Explanation, Demonstration, Tactile  cues, Verbal cues, and Handouts Education comprehension: verbalized understanding, returned demonstration, verbal cues required, and tactile cues required  HOME EXERCISE PROGRAM: Access Code: [email or web address] URL: [email or web address] Date: 06/03/2022 Prepared by: Janna Arch  Exercises - Seated Heel Toe Raises  - 1 x daily - 7 x weekly - 2 sets - 10 reps - 5 hold - Standing Tandem Balance with Counter Support  - 1 x daily - 7 x weekly - 2 sets - 2 reps - 30 hold - Standing March with Counter Support  - 1 x daily - 7 x weekly - 2 sets - 10 reps - 5 hold  GOALS: Goals reviewed with patient? Yes  SHORT TERM GOALS: Target date: 07/01/2022  Patient will be independent in home exercise program to improve strength/mobility for better functional independence with ADLs. Baseline:10/9; HEP given  Goal status: INITIAL  LONG TERM GOALS: Target date: 11/13/2022    Patient will increase FOTO score to equal to or greater than  63%  to demonstrate statistically significant improvement in mobility and quality of life.  Baseline: 10/9: 53% 11/29: 58% 12/27: 51%  Goal status: IN PROGRESS  2.  Patient (< 67 years old) will complete five times sit to stand test in < 10 seconds without UE support indicating an increased LE strength and improved balance. Baseline: 10/9: 19.6 seconds with walking stick; one LOB 11/29: 20.56 seconds 12/27: 13.4 seconds hands on knees  Goal status: IN PROGRESS  3.  Patient will increase Berg Balance score by > 45/56 to demonstrate decreased fall risk during functional activities. Baseline: 10/9: 33/56 11/29: 43/56 12/27: 44/56  Goal status: IN PROGRESS  4.  Patient will increase 10 meter walk test to >1.84ms as to improve gait speed for better community ambulation and to reduce fall risk. Baseline: 11/29: 1.19 m/s Goal status: MET  5.  Patient will increase ABC scale score >80% to demonstrate better functional mobility and better confidence with ADLs.   Baseline: 10/9: 67% 11/29: 75.6% 12/27: 78%  Goal status: IN PROGRESS  6. Patient will tolerate 5 seconds of single leg stance without loss of balance to improve ability to get in and out of shower safely. Baseline: 11/29: unable to stand on single leg safely 12/27: 2 seconds Goal status: In progress   6. Patient will increase six minute walk test distance to >1000 for progression to community ambulator and improve gait ability Baseline: 12/27: 845 ft with with walking stick  Goal status: NEW    ASSESSMENT:  CLINICAL IMPRESSION: ***The patient will continue to benefit from skilled therapy in order to continue to progress balance and overall functional capacity with ADLs.   OBJECTIVE IMPAIRMENTS Abnormal gait, decreased activity tolerance, decreased balance, decreased coordination, decreased endurance, decreased mobility, difficulty walking, decreased strength, impaired flexibility, impaired sensation, improper body mechanics, and pain.   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   REHAB POTENTIAL: Good  CLINICAL DECISION MAKING: Evolving/moderate complexity  EVALUATION COMPLEXITY: Moderate  PLAN: PT FREQUENCY: 2x/week  PT DURATION: 12 weeks  PLANNED  INTERVENTIONS: Therapeutic exercises, Therapeutic activity, Neuromuscular re-education, Balance training, Gait training, Patient/Family education, Self Care, Joint mobilization, Stair training, Vestibular training, Canalith repositioning, Visual/preceptual remediation/compensation, DME instructions, Dry Needling, Cognitive remediation, Spinal mobilization, Cryotherapy, Moist heat, Manual lymph drainage, Compression bandaging, Taping, Vasopneumatic device, Ultrasound, Manual therapy, and Re-evaluation  PLAN FOR NEXT SESSION: Continue to progress dynamic balance and dual tasking balance. Work on single leg balance progressions.    MJanna Arch PT, DPT  [facility] 08/27/22, 4:28 PM

## 2022-08-28 ENCOUNTER — Ambulatory Visit: Payer: Medicare Other | Attending: Internal Medicine

## 2022-08-28 DIAGNOSIS — R278 Other lack of coordination: Secondary | ICD-10-CM | POA: Insufficient documentation

## 2022-08-28 DIAGNOSIS — M6281 Muscle weakness (generalized): Secondary | ICD-10-CM | POA: Diagnosis present

## 2022-08-28 DIAGNOSIS — R2689 Other abnormalities of gait and mobility: Secondary | ICD-10-CM | POA: Diagnosis present

## 2022-08-28 DIAGNOSIS — R262 Difficulty in walking, not elsewhere classified: Secondary | ICD-10-CM

## 2022-08-28 DIAGNOSIS — R269 Unspecified abnormalities of gait and mobility: Secondary | ICD-10-CM | POA: Diagnosis present

## 2022-08-28 DIAGNOSIS — R2681 Unsteadiness on feet: Secondary | ICD-10-CM | POA: Diagnosis present

## 2022-09-02 ENCOUNTER — Ambulatory Visit: Payer: Medicare Other

## 2022-09-02 ENCOUNTER — Ambulatory Visit: Payer: Medicaid Other

## 2022-09-02 DIAGNOSIS — R269 Unspecified abnormalities of gait and mobility: Secondary | ICD-10-CM

## 2022-09-02 DIAGNOSIS — M6281 Muscle weakness (generalized): Secondary | ICD-10-CM

## 2022-09-02 DIAGNOSIS — R2681 Unsteadiness on feet: Secondary | ICD-10-CM

## 2022-09-02 DIAGNOSIS — R278 Other lack of coordination: Secondary | ICD-10-CM

## 2022-09-02 DIAGNOSIS — R262 Difficulty in walking, not elsewhere classified: Secondary | ICD-10-CM | POA: Diagnosis not present

## 2022-09-02 DIAGNOSIS — R2689 Other abnormalities of gait and mobility: Secondary | ICD-10-CM

## 2022-09-02 NOTE — Therapy (Signed)
OUTPATIENT PHYSICAL THERAPY NEURO TREATMENT  Patient Name: Gregory Crane MRN: [id number] DOB:June 17, 1971, 52 y.o., male Today's Date: 09/02/2022  PCP: [name] MD REFERRING PROVIDER: [name] MD  PT End of Session - 09/02/22 1308     Visit Number 18    Number of Visits 40    Date for PT Re-Evaluation 11/13/22    Authorization Type Medicare A&B; Medicaid; 11/29 PN    Authorization Time Period 06/24/22-12/121/23 for 12 visits    Authorization - Number of Visits 12    Progress Note Due on Visit 10    PT Start Time 1304    PT Stop Time 1345    PT Time Calculation (min) 41 min    Equipment Utilized During Treatment Gait belt    Activity Tolerance Patient tolerated treatment well;No increased pain;Patient limited by fatigue    Behavior During Therapy [facility] for tasks assessed/performed                Past Medical History:  Diagnosis Date   Allergy    Anemia    Bell's palsy    Diabetes mellitus without complication ([place])    diet controlled   Hypertension    Hypothyroidism    Kidney stones    Pseudotumor cerebri    Stroke [facility])    Past Surgical History:  Procedure Laterality Date   COLONOSCOPY WITH PROPOFOL N/A 03/30/2020   Procedure: COLONOSCOPY WITH PROPOFOL;  Surgeon: Lesly Rubenstein, MD;  Location: [facility] ENDOSCOPY;  Service: Endoscopy;  Laterality: N/A;   LOOP RECORDER INSERTION N/A 01/27/2018   Procedure: LOOP RECORDER INSERTION;  Surgeon: [name], MD;  Location: [place] CV LAB;  Service: Cardiovascular;  Laterality: N/A;   LUMBAR PUNCTURE     as child   NO PAST SURGERIES     TEE WITHOUT CARDIOVERSION N/A 01/07/2018   Procedure: TRANSESOPHAGEAL ECHOCARDIOGRAM (TEE);  Surgeon: Minna Merritts, MD;  Location: [facility] ORS;  Service: Cardiovascular;  Laterality: N/A;   Patient Active Problem List   Diagnosis Date Noted   Hydronephrosis, left 05/04/2022   Type II diabetes mellitus with renal manifestations ([place]) 05/04/2022   Leukocytosis  05/04/2022   Pre-op evaluation 04/24/2022   Headache 07/07/2021   Hearing loss 07/07/2021   Open wound 01/25/2021   History of colon polyps 10/15/2020   Postoperative hemorrhage involving digestive system following digestive system procedure 09/19/2020   Acute cholecystitis without calculus 09/09/2020   Type 2 diabetes mellitus, with long-term current use of insulin ([place]) 09/09/2020   Cryptogenic stroke ([place]) 07/13/2020   History of loop recorder 07/13/2020   Weakness 06/18/2020   History of 2019 novel coronavirus disease (COVID-19) 05/20/2020   ESRD (end stage renal disease) ([place]) 04/04/2020   Pneumonia due to COVID-19 virus 04/03/2020   AKI (acute kidney injury) ([place]) 04/03/2020   Elevated troponin 04/03/2020   Acquired trigger finger 06/08/2019   Lymphedema 06/08/2019   Anemia 04/17/2019   Swelling of left lower extremity 01/10/2019   Facial droop 04/09/2018   Daytime somnolence 03/30/2018   Carotid artery disease ([place]) 02/03/2018   Intracranial vascular stenosis 09/12/2017   Cough 01/20/2017   Bell's palsy 11/10/2016   History of CVA (cerebrovascular accident) 11/10/2016   Benign localized hyperplasia of prostate with urinary obstruction 10/27/2016   History of nephrolithiasis 10/27/2016   TIA (transient ischemic attack) 10/20/2016   Near syncope 06/23/2016   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   Neuropathy 08/06/2015   Health care maintenance 08/06/2015   Essential hypertension 08/06/2015   Heme  positive stool 10/10/2013   Hypothyroidism 10/10/2013   Microalbuminuria 10/10/2013   Hyperlipidemia 10/10/2013   B12 deficiency 10/10/2013   Diabetes ([place]) 07/04/2013   Environmental allergies 07/04/2013   ONSET DATE: 2-3 years  REFERRING DIAG: Neuropathy  THERAPY DIAG:  Difficulty in walking, not elsewhere classified  Muscle weakness (generalized)  Unsteadiness on feet  Abnormality of gait and mobility  Other lack of coordination  Other abnormalities of gait and  mobility  Rationale for Evaluation and Treatment Rehabilitation  SUBJECTIVE:                                                                                                                                                                                             SUBJECTIVE STATEMENT:  Pt was scheduled to have a colonoscopy this morning, but the prep didn't work, so he went to [employer] and grabbed some food.  Pt notes that once he ate, he had to rush to the bathroom here and that's the reason he's here early.  Pt notes he felt a little dizzy and that's why he is using he walking stick.  Pt accompanied by: self  PERTINENT HISTORY: Patient presents to physical therapy for neuropathy. He underwent banding of his L UE fistula on 05/13/22 and scheduled for additional banding on 06/07/22. 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 Patient reports his balance is very unsteady due to neuropathy in feet, feels weak in LE's.   PAIN:  Are you having pain? Yes: NPRS scale: 98/10 Pain location: L foot Pain description: sharp  Aggravating factors: worse at night Relieving factors: lyrica   PRECAUTIONS: Fall  WEIGHT BEARING RESTRICTIONS Yes no lifting >5 lb in arm   FALLS: Has patient fallen in last 6 months? No  LIVING ENVIRONMENT: Lives with: lives alone Lives in: House/apartment Stairs: Yes: Internal: flight steps; on right going up Has following equipment at home: Single point cane and Walker - 2 wheeled  PLOF: Independent  PATIENT GOALS to be more steady and walk as normally as possible.   OBJECTIVE:   LOWER EXTREMITY MMT:    MMT Right Eval Left Eval  Hip flexion 4- 3+  Hip extension    Hip abduction 4- 4+  Hip adduction 4- 3+  Knee flexion 4 4-  Knee extension 4 4  Ankle dorsiflexion 2+ 2+   Ankle plantarflexion 3+ 3+  (Blank rows = not tested)  FUNCTIONAL TESTs:  5 times sit to stand: 19.6 seconds with walking stick; one LOB 10 meter  walk test: 12 seconds Berg Balance Scale: 33/56  PATIENT SURVEYS:  ABC scale 67% FOTO 53%  TODAY'S TREATMENT:   TherEx   Superset: 3 sets exercise 1: 10x STS exercise 2: ambulate 160 ft   Seated plantarflexion with BTB resistance, 2x10 each LE with focus placed on good eccentric control  Seated with 5lb ankle weights: heel raise 2x15 LAQ 2x15 each LE march 2x15 each LE  Seated BTB row 15x   Deadlift with verbal and tactile cuing with visual demonstration for proper form,  x10, x5   Lateral crabwalks, 20' x4 each direction    PATIENT EDUCATION: Education details: HEP, goals, POC Person educated: Patient Education method: Explanation, Demonstration, Tactile cues, Verbal cues, and Handouts Education comprehension: verbalized understanding, returned demonstration, verbal cues required, and tactile cues required  HOME EXERCISE PROGRAM: Access Code: [email or web address] URL: [email or web address] Date: 06/03/2022 Prepared by: Janna Arch  Exercises - Seated Heel Toe Raises  - 1 x daily - 7 x weekly - 2 sets - 10 reps - 5 hold - Standing Tandem Balance with Counter Support  - 1 x daily - 7 x weekly - 2 sets - 2 reps - 30 hold - Standing March with Counter Support  - 1 x daily - 7 x weekly - 2 sets - 10 reps - 5 hold   GOALS: Goals reviewed with patient? Yes   SHORT TERM GOALS: Target date: 07/01/2022  Patient will be independent in home exercise program to improve strength/mobility for better functional independence with ADLs. Baseline:10/9; HEP given  Goal status: INITIAL   LONG TERM GOALS: Target date: 11/13/2022  Patient will increase FOTO score to equal to or greater than   63%  to demonstrate statistically significant improvement in mobility and quality of life.  Baseline: 10/9: 53% 11/29: 58% 12/27: 51%   Goal status: IN PROGRESS  2.  Patient (< 55 years old) will complete five times sit to stand test in < 10 seconds without UE support indicating an increased LE strength and improved balance. Baseline: 10/9: 19.6 seconds with walking stick; one LOB 11/29: 20.56 seconds 12/27: 13.4 seconds hands on knees  Goal status: IN PROGRESS  3.  Patient will increase Berg Balance score by > 45/56 to demonstrate decreased fall risk during functional activities. Baseline: 10/9: 33/56 11/29: 43/56 12/27: 44/56  Goal status: IN PROGRESS  4.  Patient will increase 10 meter walk test to >1.40ms as to improve gait speed for better community ambulation and to reduce fall risk. Baseline: 11/29: 1.19 m/s Goal status: MET  5.  Patient will increase ABC scale score >80% to demonstrate better functional mobility and better confidence with ADLs.  Baseline: 10/9: 67% 11/29: 75.6% 12/27: 78%  Goal status: IN PROGRESS  6. Patient will tolerate 5 seconds of single leg stance without loss of balance to improve ability to get in and out of shower safely. Baseline: 11/29: unable to stand on single leg safely 12/27: 2 seconds Goal status: IN PROGRESS   6. Patient will increase six minute walk test distance to >1000 for progression to community ambulator and improve gait ability Baseline: 12/27: 845 ft with with walking stick  Goal status: NEW    ASSESSMENT:  CLINICAL IMPRESSION:  Pt put forth great effort throughout the session and is making progress.  Pt is able to tolerate the increased reps of exercises well, noting some fatigue at the conclusion of the session.  Pt is making steady improvement in his strength and tolerance with exercises as noted.  Pt does still have some difficulty with eccentric lowering of the foot when performing heel-to-toe gait.  Pt introduced to eccentric ankle stability exercise with the BTB and was able to perform with good technique.   Pt will continue to benefit from skilled therapy to  address remaining deficits in order to improve overall QoL and return to PLOF.     OBJECTIVE IMPAIRMENTS Abnormal gait, decreased activity tolerance, decreased balance, decreased coordination, decreased endurance, decreased mobility, difficulty walking, decreased strength, impaired flexibility, impaired sensation, improper body mechanics, and pain.   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   REHAB POTENTIAL: Good  CLINICAL DECISION MAKING: Evolving/moderate complexity  EVALUATION COMPLEXITY: Moderate  PLAN: PT FREQUENCY: 2x/week  PT DURATION: 12 weeks  PLANNED INTERVENTIONS: Therapeutic exercises, Therapeutic activity, Neuromuscular re-education, Balance training, Gait training, Patient/Family education, Self Care, Joint mobilization, Stair training, Vestibular training, Canalith repositioning, Visual/preceptual remediation/compensation, DME instructions, Dry Needling, Cognitive remediation, Spinal mobilization, Cryotherapy, Moist heat, Manual lymph drainage, Compression bandaging, Taping, Vasopneumatic device, Ultrasound, Manual therapy, and Re-evaluation  PLAN FOR NEXT SESSION: Continue to progress dynamic balance and dual tasking balance. Work on single leg balance  progressions.     Gwenlyn Saran, PT, DPT Physical Therapist- [facility]  09/02/22, 1:08 PM

## 2022-09-05 NOTE — Therapy (Signed)
OUTPATIENT PHYSICAL THERAPY NEURO TREATMENT  Patient Name: Gregory Crane MRN: [id number] DOB:Nov 21, 1970, 52 y.o., male Today's Date: 09/09/2022  PCP: [name] MD REFERRING PROVIDER: [name] MD  PT End of Session - 09/09/22 1007     Visit Number 19    Number of Visits 40    Date for PT Re-Evaluation 11/13/22    Authorization Type Medicare A&B; Medicaid; 11/29 PN    Authorization Time Period 06/24/22-12/121/23 for 12 visits    Authorization - Number of Visits 12    Progress Note Due on Visit 10    PT Start Time 1015    PT Stop Time 1059    PT Time Calculation (min) 44 min    Equipment Utilized During Treatment Gait belt    Activity Tolerance Patient tolerated treatment well;No increased pain;Patient limited by fatigue    Behavior During Therapy [facility] for tasks assessed/performed                 Past Medical History:  Diagnosis Date   Allergy    Anemia    Bell's palsy    Diabetes mellitus without complication ([place])    diet controlled   Hypertension    Hypothyroidism    Kidney stones    Pseudotumor cerebri    Stroke [facility])    Past Surgical History:  Procedure Laterality Date   COLONOSCOPY WITH PROPOFOL N/A 03/30/2020   Procedure: COLONOSCOPY WITH PROPOFOL;  Surgeon: Lesly Rubenstein, MD;  Location: [facility] ENDOSCOPY;  Service: Endoscopy;  Laterality: N/A;   LOOP RECORDER INSERTION N/A 01/27/2018   Procedure: LOOP RECORDER INSERTION;  Surgeon: [name], MD;  Location: [place] CV LAB;  Service: Cardiovascular;  Laterality: N/A;   LUMBAR PUNCTURE     as child   NO PAST SURGERIES     TEE WITHOUT CARDIOVERSION N/A 01/07/2018   Procedure: TRANSESOPHAGEAL ECHOCARDIOGRAM (TEE);  Surgeon: Minna Merritts, MD;  Location: [facility] ORS;  Service: Cardiovascular;  Laterality: N/A;   Patient Active Problem List   Diagnosis Date Noted   Hydronephrosis, left 05/04/2022   Type II diabetes mellitus with renal manifestations ([place]) 05/04/2022   Leukocytosis  05/04/2022   Pre-op evaluation 04/24/2022   Headache 07/07/2021   Hearing loss 07/07/2021   Open wound 01/25/2021   History of colon polyps 10/15/2020   Postoperative hemorrhage involving digestive system following digestive system procedure 09/19/2020   Acute cholecystitis without calculus 09/09/2020   Type 2 diabetes mellitus, with long-term current use of insulin ([place]) 09/09/2020   Cryptogenic stroke ([place]) 07/13/2020   History of loop recorder 07/13/2020   Weakness 06/18/2020   History of 2019 novel coronavirus disease (COVID-19) 05/20/2020   ESRD (end stage renal disease) ([place]) 04/04/2020   Pneumonia due to COVID-19 virus 04/03/2020   AKI (acute kidney injury) ([place]) 04/03/2020   Elevated troponin 04/03/2020   Acquired trigger finger 06/08/2019   Lymphedema 06/08/2019   Anemia 04/17/2019   Swelling of left lower extremity 01/10/2019   Facial droop 04/09/2018   Daytime somnolence 03/30/2018   Carotid artery disease ([place]) 02/03/2018   Intracranial vascular stenosis 09/12/2017   Cough 01/20/2017   Bell's palsy 11/10/2016   History of CVA (cerebrovascular accident) 11/10/2016   Benign localized hyperplasia of prostate with urinary obstruction 10/27/2016   History of nephrolithiasis 10/27/2016   TIA (transient ischemic attack) 10/20/2016   Near syncope 06/23/2016   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   Neuropathy 08/06/2015   Health care maintenance 08/06/2015   Essential hypertension 08/06/2015  Heme positive stool 10/10/2013   Hypothyroidism 10/10/2013   Microalbuminuria 10/10/2013   Hyperlipidemia 10/10/2013   B12 deficiency 10/10/2013   Diabetes ([place]) 07/04/2013   Environmental allergies 07/04/2013   ONSET DATE: 2-3 years  REFERRING DIAG: Neuropathy  THERAPY DIAG:  Difficulty in walking, not elsewhere classified  Muscle weakness (generalized)  Unsteadiness on feet  Abnormality of gait and mobility  Rationale for Evaluation and Treatment  Rehabilitation  SUBJECTIVE:                                                                                                                                                                                             SUBJECTIVE STATEMENT:  Since seen last patient had revised fistula  on 09/06/21. Restrictions include don't lift more than 10lb for 2 weeks. Has been feeling off since surgery.   Pt accompanied by: self  PERTINENT HISTORY: Patient presents to physical therapy for neuropathy. He underwent banding of his L UE fistula on 05/13/22 and scheduled for additional banding on 06/07/22. 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 Patient reports his balance is very unsteady due to neuropathy in feet, feels weak in LE's.   PAIN:  Are you having pain? Yes: NPRS scale: 98/10 Pain location: L foot Pain description: sharp  Aggravating factors: worse at night Relieving factors: lyrica   PRECAUTIONS: Fall  WEIGHT BEARING RESTRICTIONS Yes no lifting >5 lb in arm   FALLS: Has patient fallen in last 6 months? No  LIVING ENVIRONMENT: Lives with: lives alone Lives in: House/apartment Stairs: Yes: Internal: flight steps; on right going up Has following equipment at home: Single point cane and Walker - 2 wheeled  PLOF: Independent  PATIENT GOALS to be more steady and walk as normally as possible.   OBJECTIVE:   LOWER EXTREMITY MMT:    MMT Right Eval Left Eval  Hip flexion 4- 3+  Hip extension    Hip abduction 4- 4+  Hip adduction 4- 3+  Knee flexion 4 4-  Knee extension 4 4  Ankle dorsiflexion 2+ 2+  Ankle plantarflexion 3+ 3+  (Blank rows = not tested)  FUNCTIONAL TESTs:  5 times sit to stand: 19.6 seconds with walking stick; one LOB 10 meter walk test: 12 seconds Berg Balance Scale:  33/56  PATIENT SURVEYS:  ABC scale 67% FOTO 53%  TODAY'S TREATMENT:   TherEx  BP at start of tx: seated 147/62; standing 128/59   Superset: 2 sets exercise 1: 10x STS exercise 2: ambulate 160  ft    Seated  -half foam roller df/pf 20x  -lateral step over half foam roller with focus on toe tap 10x each side; challenging LLE -toe taps to half foam roller 30 seconds x 2 trials  -YTB march with df 10x each LE  -LAQ with adduction ball squeeze 10x -adduction ball squeeze 15x  PATIENT EDUCATION: Education details: HEP, goals, POC Person educated: Patient Education method: Explanation, Demonstration, Tactile cues, Verbal cues, and Handouts Education comprehension: verbalized understanding, returned demonstration, verbal cues required, and tactile cues required  HOME EXERCISE PROGRAM: Access Code: [email or web address] URL: [email or web address] Date: 06/03/2022 Prepared by: Janna Arch  Exercises - Seated Heel Toe Raises  - 1 x daily - 7 x weekly - 2 sets - 10 reps - 5 hold - Standing Tandem Balance with Counter Support  - 1 x daily - 7 x weekly - 2 sets - 2 reps - 30 hold - Standing March with Counter Support  - 1 x daily - 7 x weekly - 2 sets - 10 reps - 5 hold   GOALS: Goals reviewed with patient? Yes   SHORT TERM GOALS: Target date: 07/01/2022  Patient will be independent in home exercise program to improve strength/mobility for better functional independence with ADLs. Baseline:10/9; HEP given  Goal status: INITIAL   LONG TERM GOALS: Target date: 11/13/2022  Patient will increase FOTO score to equal to or greater than   63%  to demonstrate statistically significant improvement in mobility and quality of life.  Baseline: 10/9: 53% 11/29: 58% 12/27: 51%  Goal status: IN PROGRESS  2.  Patient (< 63 years old) will complete five times sit to stand test in < 10 seconds without UE support indicating an increased LE strength and improved balance. Baseline: 10/9: 19.6  seconds with walking stick; one LOB 11/29: 20.56 seconds 12/27: 13.4 seconds hands on knees  Goal status: IN PROGRESS  3.  Patient will increase Berg Balance score by > 45/56 to demonstrate decreased fall risk during functional activities. Baseline: 10/9: 33/56 11/29: 43/56 12/27: 44/56  Goal status: IN PROGRESS  4.  Patient will increase 10 meter walk test to >1.14ms as to improve gait speed for better community ambulation and to reduce fall risk. Baseline: 11/29: 1.19 m/s Goal status: MET  5.  Patient will increase ABC scale score >80% to demonstrate better functional mobility and better confidence with ADLs.  Baseline: 10/9: 67% 11/29: 75.6% 12/27: 78%  Goal status: IN PROGRESS  6. Patient will tolerate 5 seconds of single leg stance without loss of balance to improve ability to get in and out of shower safely. Baseline: 11/29: unable to stand on single leg safely 12/27: 2 seconds Goal status: IN PROGRESS   6. Patient will increase six minute walk test distance to >1000 for progression to community ambulator and improve gait ability Baseline: 12/27: 845 ft with with walking stick  Goal status: NEW    ASSESSMENT:  CLINICAL IMPRESSION:  Ankle strengthening interventions tolerated well for decreased foot slap with ambulation. Patient is fatigued and requires multiple ret breaks this session due to s/p procedure. Patient remains highly motivated despite fatigue.  Pt will continue to benefit from skilled therapy to address remaining deficits in order to improve overall QoL and return to PLOF.     OBJECTIVE IMPAIRMENTS Abnormal gait, decreased activity tolerance, decreased balance, decreased coordination, decreased endurance, decreased mobility, difficulty walking, decreased strength, impaired flexibility, impaired sensation, improper body mechanics, and pain.   ACTIVITY LIMITATIONS carrying, lifting, bending,  standing, squatting, sleeping, stairs, transfers, bed mobility, bathing,  toileting, dressing, locomotion level, and caring for others  PARTICIPATION LIMITATIONS: meal prep, cleaning, laundry, medication management, personal finances, interpersonal relationship, driving, shopping, community activity, and yard work  PERSONAL FACTORS Age, Fitness, Past/current experiences, Time since onset of injury/illness/exacerbation, Transportation, and 3+ comorbidities: CAD, cryptogenic stroke (2021), deafness in R ear, diabetic retinopathy, dialysis, ESRD, COVID, HTN, hypothyroidism, leukocytosis, nephrolithiasis, neuropathy, Pseudotumor cerebri 1986, sepsis, DM type I, DM type II  are also affecting patient's functional outcome.   REHAB POTENTIAL: Good  CLINICAL DECISION MAKING: Evolving/moderate complexity  EVALUATION COMPLEXITY: Moderate  PLAN: PT FREQUENCY: 2x/week  PT DURATION: 12 weeks  PLANNED INTERVENTIONS: Therapeutic exercises, Therapeutic activity, Neuromuscular re-education, Balance training, Gait training, Patient/Family education, Self Care, Joint mobilization, Stair training, Vestibular training, Canalith repositioning, Visual/preceptual remediation/compensation, DME instructions, Dry Needling, Cognitive remediation, Spinal mobilization, Cryotherapy, Moist heat, Manual lymph drainage, Compression bandaging, Taping, Vasopneumatic device, Ultrasound, Manual therapy, and Re-evaluation  PLAN FOR NEXT SESSION: Continue to progress dynamic balance and dual tasking balance. Work on single leg balance progressions.     Janna Arch PT  Physical Therapist- [facility]  09/09/22, 10:59 AM

## 2022-09-09 ENCOUNTER — Ambulatory Visit: Payer: Medicare Other

## 2022-09-09 DIAGNOSIS — M6281 Muscle weakness (generalized): Secondary | ICD-10-CM

## 2022-09-09 DIAGNOSIS — R2681 Unsteadiness on feet: Secondary | ICD-10-CM

## 2022-09-09 DIAGNOSIS — R269 Unspecified abnormalities of gait and mobility: Secondary | ICD-10-CM

## 2022-09-09 DIAGNOSIS — R262 Difficulty in walking, not elsewhere classified: Secondary | ICD-10-CM | POA: Diagnosis not present

## 2022-09-10 NOTE — Therapy (Signed)
OUTPATIENT PHYSICAL THERAPY NEURO TREATMENT/ Physical Therapy Progress Note   Dates of reporting period  07/24/22   to   09/11/22   Patient Name: Gregory Crane MRN: [id number] DOB:02-22-1971, 52 y.o., male Today's Date: 09/11/2022  PCP: [name] MD REFERRING PROVIDER: [name] MD  PT End of Session - 09/11/22 1019     Visit Number 20    Number of Visits 40    Date for PT Re-Evaluation 11/13/22    Authorization Type Medicare A&B; Medicaid; 09/11/22    Authorization Time Period 06/24/22-12/121/23 for 12 visits    Authorization - Number of Visits 12    Progress Note Due on Visit 10    PT Start Time 1015    PT Stop Time 1059    PT Time Calculation (min) 44 min    Equipment Utilized During Treatment Gait belt    Activity Tolerance Patient tolerated treatment well;No increased pain;Patient limited by fatigue    Behavior During Therapy [facility] for tasks assessed/performed                  Past Medical History:  Diagnosis Date   Allergy    Anemia    Bell's palsy    Diabetes mellitus without complication ([place])    diet controlled   Hypertension    Hypothyroidism    Kidney stones    Pseudotumor cerebri    Stroke [facility])    Past Surgical History:  Procedure Laterality Date   COLONOSCOPY WITH PROPOFOL N/A 03/30/2020   Procedure: COLONOSCOPY WITH PROPOFOL;  Surgeon: Lesly Rubenstein, MD;  Location: [facility] ENDOSCOPY;  Service: Endoscopy;  Laterality: N/A;   LOOP RECORDER INSERTION N/A 01/27/2018   Procedure: LOOP RECORDER INSERTION;  Surgeon: [name], MD;  Location: [place] [facility];  Service: Cardiovascular;  Laterality: N/A;   LUMBAR PUNCTURE     as child   NO PAST SURGERIES     TEE WITHOUT CARDIOVERSION N/A 01/07/2018   Procedure: TRANSESOPHAGEAL ECHOCARDIOGRAM (TEE);  Surgeon: Minna Merritts, MD;  Location: [facility] ORS;  Service: Cardiovascular;  Laterality: N/A;   Patient Active Problem List   Diagnosis Date Noted   Hydronephrosis, left  05/04/2022   Type II diabetes mellitus with renal manifestations ([place]) 05/04/2022   Leukocytosis 05/04/2022   Pre-op evaluation 04/24/2022   Headache 07/07/2021   Hearing loss 07/07/2021   Open wound 01/25/2021   History of colon polyps 10/15/2020   Postoperative hemorrhage involving digestive system following digestive system procedure 09/19/2020   Acute cholecystitis without calculus 09/09/2020   Type 2 diabetes mellitus, with long-term current use of insulin ([place]) 09/09/2020   Cryptogenic stroke ([place]) 07/13/2020   History of loop recorder 07/13/2020   Weakness 06/18/2020   History of 2019 novel coronavirus disease (COVID-19) 05/20/2020   ESRD (end stage renal disease) ([place]) 04/04/2020   Pneumonia due to COVID-19 virus 04/03/2020   AKI (acute kidney injury) ([place]) 04/03/2020   Elevated troponin 04/03/2020   Acquired trigger finger 06/08/2019   Lymphedema 06/08/2019   Anemia 04/17/2019   Swelling of left lower extremity 01/10/2019   Facial droop 04/09/2018   Daytime somnolence 03/30/2018   Carotid artery disease ([place]) 02/03/2018   Intracranial vascular stenosis 09/12/2017   Cough 01/20/2017   Bell's palsy 11/10/2016   History of CVA (cerebrovascular accident) 11/10/2016   Benign localized hyperplasia of prostate with urinary obstruction 10/27/2016   History of nephrolithiasis 10/27/2016   TIA (transient ischemic attack) 10/20/2016   Near syncope 06/23/2016   Organic  impotence 10/01/2015   Neuropathy 08/06/2015   Health care maintenance 08/06/2015   Essential hypertension 08/06/2015   Heme positive stool 10/10/2013   Hypothyroidism 10/10/2013   Microalbuminuria 10/10/2013   Hyperlipidemia 10/10/2013   B12 deficiency 10/10/2013   Diabetes ([place]) 07/04/2013   Environmental allergies 07/04/2013   ONSET DATE: 2-3 years  REFERRING DIAG: Neuropathy  THERAPY DIAG:  Difficulty in walking, not elsewhere classified  Muscle weakness (generalized)  Unsteadiness on  feet  Abnormality of gait and mobility  Rationale for Evaluation and Treatment Rehabilitation  SUBJECTIVE:                                                                                                                                                                                             SUBJECTIVE STATEMENT:  Patient reports he feels like he is 60% back to baseline.  Restrictions include don't lift more than 10lb for 2 weeks.    Pt accompanied by: self  PERTINENT HISTORY: Patient presents to physical therapy for neuropathy. He underwent banding of his L UE fistula on 05/13/22 and scheduled for additional banding on 06/07/22. 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 Patient reports his balance is very unsteady due to neuropathy in feet, feels weak in LE's.   PAIN:  Are you having pain? Yes: NPRS scale: 98/10 Pain location: L foot Pain description: sharp  Aggravating factors: worse at night Relieving factors: lyrica   PRECAUTIONS: Fall  WEIGHT BEARING RESTRICTIONS Yes no lifting >5 lb in arm   FALLS: Has patient fallen in last 6 months? No  LIVING ENVIRONMENT: Lives with: lives alone Lives in: House/apartment Stairs: Yes: Internal: flight steps; on right going up Has following equipment at home: Single point cane and Walker - 2 wheeled  PLOF: Independent  PATIENT GOALS to be more steady and walk as normally as possible.   OBJECTIVE:   LOWER EXTREMITY MMT:    MMT Right Eval Left Eval  Hip flexion 4- 3+  Hip extension    Hip abduction 4- 4+  Hip adduction 4- 3+  Knee flexion 4 4-  Knee extension 4 4  Ankle dorsiflexion 2+ 2+  Ankle plantarflexion 3+ 3+  (Blank rows = not tested)  FUNCTIONAL TESTs:  5 times sit to stand: 19.6 seconds with walking stick; one LOB 10 meter  walk test: 12 seconds Berg Balance Scale: 33/56  PATIENT SURVEYS:  ABC scale 67% FOTO 53%  TODAY'S TREATMENT:    GOALS: See below:  TREATMENT:  Seated  -  adduction ball squeeze 15x 3 second holds -Black TB abduction 15x  -black TB march 15x  -black TB hamstring curl 10x each LE   PATIENT EDUCATION: Education details: HEP, goals, POC Person educated: Patient Education method: Explanation, Demonstration, Tactile cues, Verbal cues, and Handouts Education comprehension: verbalized understanding, returned demonstration, verbal cues required, and tactile cues required  HOME EXERCISE PROGRAM: Access Code: [email or web address] URL: [email or web address] Date: 06/03/2022 Prepared by: Janna Arch  Exercises - Seated Heel Toe Raises  - 1 x daily - 7 x weekly - 2 sets - 10 reps - 5 hold - Standing Tandem Balance with Counter Support  - 1 x daily - 7 x weekly - 2 sets - 2 reps - 30 hold - Standing March with Counter Support  - 1 x daily - 7 x weekly - 2 sets - 10 reps - 5 hold   GOALS: Goals reviewed with patient? Yes   SHORT TERM GOALS: Target date: 07/01/2022  Patient will be independent in home exercise program to improve strength/mobility for better functional independence with ADLs. Baseline:10/9; HEP given  Goal status: INITIAL   LONG TERM GOALS: Target date: 11/13/2022  Patient will increase FOTO score to equal to or greater than   63%  to demonstrate statistically significant improvement in mobility and quality of life.  Baseline: 10/9: 53% 11/29: 58% 12/27: 51% 1/17: 56%  Goal status: IN PROGRESS  2.  Patient (< 49 years old) will complete five times sit to stand test in < 10 seconds without UE support indicating an increased LE strength and improved balance. Baseline: 10/9: 19.6 seconds with walking stick; one LOB 11/29: 20.56 seconds 12/27: 13.4 seconds hands on knees 1/17: 13 seconds hands on knees  Goal status: IN PROGRESS  3.  Patient will increase Berg  Balance score by > 45/56 to demonstrate decreased fall risk during functional activities. Baseline: 10/9: 33/56 11/29: 43/56 12/27: 44/56 1/17:  44/56 Goal status: IN PROGRESS  4.  Patient will increase 10 meter walk test to >1.3ms as to improve gait speed for better community ambulation and to reduce fall risk. Baseline: 11/29: 1.19 m/s Goal status: MET  5.  Patient will increase ABC scale score >80% to demonstrate better functional mobility and better confidence with ADLs.  Baseline: 10/9: 67% 11/29: 75.6% 12/27: 78%  Goal status: IN PROGRESS  6. Patient will tolerate 5 seconds of single leg stance without loss of balance to improve ability to get in and out of shower safely. Baseline: 11/29: unable to stand on single leg safely 12/27: 2 seconds 1/17: 3 seconds Goal status: IN PROGRESS   6. Patient will increase six minute walk test distance to >1000 for progression to community ambulator and improve gait ability Baseline: 12/27: 845 ft with with walking stick 1/17: [id number] status: partially met    ASSESSMENT:  CLINICAL IMPRESSION:  Patient's condition has the potential to improve in response to therapy. Maximum improvement is yet to be obtained. The anticipated improvement is attainable and reasonable in a generally predictable time.  Patient is still recovering from recent procedure and has some fatigue with standing and instability with need for rest breaks. Patient remains highly motivated throughout session despite fatigue and is showing progress in general prior to procedure. Pt will continue to benefit from skilled therapy to address remaining deficits in order to improve overall QoL and return to PLOF.     OBJECTIVE IMPAIRMENTS Abnormal gait, decreased activity tolerance, decreased balance, decreased coordination, decreased endurance, decreased mobility, difficulty walking,  decreased strength, impaired flexibility, impaired sensation, improper body mechanics, and pain.    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   REHAB POTENTIAL: Good  CLINICAL DECISION MAKING: Evolving/moderate complexity  EVALUATION COMPLEXITY: Moderate  PLAN: PT FREQUENCY: 2x/week  PT DURATION: 12 weeks  PLANNED INTERVENTIONS: Therapeutic exercises, Therapeutic activity, Neuromuscular re-education, Balance training, Gait training, Patient/Family education, Self Care, Joint mobilization, Stair training, Vestibular training, Canalith repositioning, Visual/preceptual remediation/compensation, DME instructions, Dry Needling, Cognitive remediation, Spinal mobilization, Cryotherapy, Moist heat, Manual lymph drainage, Compression bandaging, Taping, Vasopneumatic device, Ultrasound, Manual therapy, and Re-evaluation  PLAN FOR NEXT SESSION: Continue to progress dynamic balance and dual tasking balance. Work on single leg balance progressions.     Janna Arch PT  Physical Therapist- [facility]  09/11/22, 11:00 AM

## 2022-09-11 ENCOUNTER — Ambulatory Visit: Payer: Medicare Other

## 2022-09-11 ENCOUNTER — Other Ambulatory Visit: Payer: Self-pay | Admitting: Internal Medicine

## 2022-09-11 DIAGNOSIS — R262 Difficulty in walking, not elsewhere classified: Secondary | ICD-10-CM | POA: Diagnosis not present

## 2022-09-11 DIAGNOSIS — R269 Unspecified abnormalities of gait and mobility: Secondary | ICD-10-CM

## 2022-09-11 DIAGNOSIS — R2681 Unsteadiness on feet: Secondary | ICD-10-CM

## 2022-09-11 DIAGNOSIS — M6281 Muscle weakness (generalized): Secondary | ICD-10-CM

## 2022-09-12 NOTE — Therapy (Signed)
OUTPATIENT PHYSICAL THERAPY NEURO TREATMENT   Patient Name: Gregory Crane MRN: [id number] DOB:05-12-1971, 52 y.o., male Today's Date: 09/16/2022  PCP: [name] MD REFERRING PROVIDER: [name] MD  PT End of Session - 09/16/22 1016     Visit Number 21    Number of Visits 40    Date for PT Re-Evaluation 11/13/22    Authorization Type Medicare A&B; Medicaid; 09/11/22    Authorization Time Period 06/24/22-12/121/23 for 12 visits    Authorization - Number of Visits 12    Progress Note Due on Visit 10    PT Start Time 1015    PT Stop Time 1059    PT Time Calculation (min) 44 min    Equipment Utilized During Treatment Gait belt    Activity Tolerance Patient tolerated treatment well;No increased pain;Patient limited by fatigue    Behavior During Therapy [facility] for tasks assessed/performed                   Past Medical History:  Diagnosis Date   Allergy    Anemia    Bell's palsy    Diabetes mellitus without complication ([place])    diet controlled   Hypertension    Hypothyroidism    Kidney stones    Pseudotumor cerebri    Stroke [facility])    Past Surgical History:  Procedure Laterality Date   COLONOSCOPY WITH PROPOFOL N/A 03/30/2020   Procedure: COLONOSCOPY WITH PROPOFOL;  Surgeon: Lesly Rubenstein, MD;  Location: [facility] ENDOSCOPY;  Service: Endoscopy;  Laterality: N/A;   LOOP RECORDER INSERTION N/A 01/27/2018   Procedure: LOOP RECORDER INSERTION;  Surgeon: [name], MD;  Location: [place] [facility] LAB;  Service: Cardiovascular;  Laterality: N/A;   LUMBAR PUNCTURE     as child   NO PAST SURGERIES     TEE WITHOUT CARDIOVERSION N/A 01/07/2018   Procedure: TRANSESOPHAGEAL ECHOCARDIOGRAM (TEE);  Surgeon: Minna Merritts, MD;  Location: [facility] ORS;  Service: Cardiovascular;  Laterality: N/A;   Patient Active Problem List   Diagnosis Date Noted   Hydronephrosis, left 05/04/2022   Type II diabetes mellitus with renal manifestations ([place]) 05/04/2022    Leukocytosis 05/04/2022   Pre-op evaluation 04/24/2022   Headache 07/07/2021   Hearing loss 07/07/2021   Open wound 01/25/2021   History of colon polyps 10/15/2020   Postoperative hemorrhage involving digestive system following digestive system procedure 09/19/2020   Acute cholecystitis without calculus 09/09/2020   Type 2 diabetes mellitus, with long-term current use of insulin ([place]) 09/09/2020   Cryptogenic stroke ([place]) 07/13/2020   History of loop recorder 07/13/2020   Weakness 06/18/2020   History of 2019 novel coronavirus disease (COVID-19) 05/20/2020   ESRD (end stage renal disease) ([place]) 04/04/2020   Pneumonia due to COVID-19 virus 04/03/2020   AKI (acute kidney injury) ([place]) 04/03/2020   Elevated troponin 04/03/2020   Acquired trigger finger 06/08/2019   Lymphedema 06/08/2019   Anemia 04/17/2019   Swelling of left lower extremity 01/10/2019   Facial droop 04/09/2018   Daytime somnolence 03/30/2018   Carotid artery disease ([place]) 02/03/2018   Intracranial vascular stenosis 09/12/2017   Cough 01/20/2017   Bell's palsy 11/10/2016   History of CVA (cerebrovascular accident) 11/10/2016   Benign localized hyperplasia of prostate with urinary obstruction 10/27/2016   History of nephrolithiasis 10/27/2016   TIA (transient ischemic attack) 10/20/2016   Near syncope 06/23/2016   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   Neuropathy 08/06/2015   Health care maintenance 08/06/2015   Essential hypertension 08/06/2015  Heme positive stool 10/10/2013   Hypothyroidism 10/10/2013   Microalbuminuria 10/10/2013   Hyperlipidemia 10/10/2013   B12 deficiency 10/10/2013   Diabetes ([place]) 07/04/2013   Environmental allergies 07/04/2013   ONSET DATE: 2-3 years  REFERRING DIAG: Neuropathy  THERAPY DIAG:  Difficulty in walking, not elsewhere classified  Muscle weakness (generalized)  Unsteadiness on feet  Abnormality of gait and mobility  Rationale for Evaluation and Treatment  Rehabilitation  SUBJECTIVE:                                                                                                                                                                                             SUBJECTIVE STATEMENT:  Patient reports having a weird movement. Was able to baby sit his grandbabies.    Pt accompanied by: self  PERTINENT HISTORY: Patient presents to physical therapy for neuropathy. He underwent banding of his L UE fistula on 05/13/22 and scheduled for additional banding on 06/07/22. 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 Patient reports his balance is very unsteady due to neuropathy in feet, feels weak in LE's.   PAIN:  Are you having pain? Yes: NPRS scale: 98/10 Pain location: L foot Pain description: sharp  Aggravating factors: worse at night Relieving factors: lyrica   PRECAUTIONS: Fall  WEIGHT BEARING RESTRICTIONS Yes no lifting >5 lb in arm   FALLS: Has patient fallen in last 6 months? No  LIVING ENVIRONMENT: Lives with: lives alone Lives in: House/apartment Stairs: Yes: Internal: flight steps; on right going up Has following equipment at home: Single point cane and Walker - 2 wheeled  PLOF: Independent  PATIENT GOALS to be more steady and walk as normally as possible.   OBJECTIVE:   LOWER EXTREMITY MMT:    MMT Right Eval Left Eval  Hip flexion 4- 3+  Hip extension    Hip abduction 4- 4+  Hip adduction 4- 3+  Knee flexion 4 4-  Knee extension 4 4  Ankle dorsiflexion 2+ 2+  Ankle plantarflexion 3+ 3+  (Blank rows = not tested)  FUNCTIONAL TESTs:  5 times sit to stand: 19.6 seconds with walking stick; one LOB 10 meter walk test: 12 seconds Berg Balance Scale: 33/56  PATIENT SURVEYS:  ABC scale 67% FOTO 53%  TODAY'S TREATMENT:      TREATMENT:    Superset: 2 sets exercise 1: 10x STS exercise 2: ambulate 160 ft   Standing: Incline stand 30 seconds  Airex pad: visual scan with reaching with dual task word  game x 7 minutes Airex pad: march with BUE support  Seated  -adduction ball squeeze 15x 3 second holds -LAQ with adduction ball squeeze 15x -heel raise with adduction ball squeeze 15x  -6" step: toe taps 30 seconds x 2 trials  -lateral step over hedgehog x 10 each LE  -clockwise ankle circles 10x, counterclockwise 10x  PATIENT EDUCATION: Education details: HEP, goals, POC Person educated: Patient Education method: Explanation, Demonstration, Tactile cues, Verbal cues, and Handouts Education comprehension: verbalized understanding, returned demonstration, verbal cues required, and tactile cues required  HOME EXERCISE PROGRAM: Access Code: [email or web address] URL: [email or web address] Date: 06/03/2022 Prepared by: Janna Arch  Exercises - Seated Heel Toe Raises  - 1 x daily - 7 x weekly - 2 sets - 10 reps - 5 hold - Standing Tandem Balance with Counter Support  - 1 x daily - 7 x weekly - 2 sets - 2 reps - 30 hold - Standing March with Counter Support  - 1 x daily - 7 x weekly - 2 sets - 10 reps - 5 hold   GOALS: Goals reviewed with patient? Yes   SHORT TERM GOALS: Target date: 07/01/2022  Patient will be independent in home exercise program to improve strength/mobility for better functional independence with ADLs. Baseline:10/9; HEP given  Goal status: INITIAL   LONG TERM GOALS: Target date: 11/13/2022  Patient will increase FOTO score to equal to or greater than   63%  to demonstrate statistically significant improvement in mobility and quality of life.  Baseline: 10/9: 53% 11/29: 58% 12/27: 51% 1/17: 56%  Goal status: IN PROGRESS  2.  Patient (< 79 years old) will complete five times sit to stand test in < 10 seconds without UE support indicating an increased LE strength and  improved balance. Baseline: 10/9: 19.6 seconds with walking stick; one LOB 11/29: 20.56 seconds 12/27: 13.4 seconds hands on knees 1/17: 13 seconds hands on knees  Goal status: IN PROGRESS  3.  Patient will increase Berg Balance score by > 45/56 to demonstrate decreased fall risk during functional activities. Baseline: 10/9: 33/56 11/29: 43/56 12/27: 44/56 1/17:  44/56 Goal status: IN PROGRESS  4.  Patient will increase 10 meter walk test to >1.39ms as to improve gait speed for better community ambulation and to reduce fall risk. Baseline: 11/29: 1.19 m/s Goal status: MET  5.  Patient will increase ABC scale score >80% to demonstrate better functional mobility and better confidence with ADLs.  Baseline: 10/9: 67% 11/29: 75.6% 12/27: 78%  Goal status: IN PROGRESS  6. Patient will tolerate 5 seconds of single leg stance without loss of balance to improve ability to get in and out of shower safely. Baseline: 11/29: unable to stand on single leg safely 12/27: 2 seconds 1/17: 3 seconds Goal status: IN PROGRESS   6. Patient will increase six minute walk test distance to >1000 for progression to community ambulator and improve gait ability Baseline: 12/27: 845 ft with with walking stick 1/17: [id number] status: partially met    ASSESSMENT:  CLINICAL IMPRESSION:  Patient tolerates progressive strengthening and stability interventions. Coordination of LLE is challenged by fatigue of repeated muscle recruitment. Patient I challenged with 8th sit to stand losing balance time.Patient remains highly motivated throughout session.  Pt will continue to benefit from skilled therapy to address remaining deficits in order to improve overall QoL and return to PLOF.     OBJECTIVE IMPAIRMENTS Abnormal gait, decreased activity tolerance, decreased balance, decreased coordination, decreased endurance, decreased mobility, difficulty  walking, decreased strength, impaired flexibility, impaired sensation,  improper body mechanics, and pain.   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   REHAB POTENTIAL: Good  CLINICAL DECISION MAKING: Evolving/moderate complexity  EVALUATION COMPLEXITY: Moderate  PLAN: PT FREQUENCY: 2x/week  PT DURATION: 12 weeks  PLANNED INTERVENTIONS: Therapeutic exercises, Therapeutic activity, Neuromuscular re-education, Balance training, Gait training, Patient/Family education, Self Care, Joint mobilization, Stair training, Vestibular training, Canalith repositioning, Visual/preceptual remediation/compensation, DME instructions, Dry Needling, Cognitive remediation, Spinal mobilization, Cryotherapy, Moist heat, Manual lymph drainage, Compression bandaging, Taping, Vasopneumatic device, Ultrasound, Manual therapy, and Re-evaluation  PLAN FOR NEXT SESSION: Continue to progress dynamic balance and dual tasking balance. Work on single leg balance progressions.     Janna Arch PT  Physical Therapist- [facility]  09/16/22, 11:01 AM

## 2022-09-12 NOTE — Telephone Encounter (Signed)
Rx ok'd for lyrica #[id number] with 3 refills.

## 2022-09-16 ENCOUNTER — Ambulatory Visit: Payer: Medicare Other

## 2022-09-16 DIAGNOSIS — R262 Difficulty in walking, not elsewhere classified: Secondary | ICD-10-CM | POA: Diagnosis not present

## 2022-09-16 DIAGNOSIS — R2681 Unsteadiness on feet: Secondary | ICD-10-CM

## 2022-09-16 DIAGNOSIS — M6281 Muscle weakness (generalized): Secondary | ICD-10-CM

## 2022-09-16 DIAGNOSIS — R269 Unspecified abnormalities of gait and mobility: Secondary | ICD-10-CM

## 2022-09-17 NOTE — Therapy (Signed)
OUTPATIENT PHYSICAL THERAPY NEURO TREATMENT   Patient Name: Gregory Crane MRN: [id number] DOB:[birth year], 52 y.o., male Today's Date: 09/18/2022  PCP: [name] MD REFERRING PROVIDER: [name] MD  PT End of Session - 09/18/22 1015     Visit Number 22    Number of Visits 40    Date for PT Re-Evaluation 11/13/22    Authorization Type Medicare A&B; Medicaid; 09/11/22    Authorization Time Period 06/24/22-12/121/23 for 12 visits    Authorization - Number of Visits 12    Progress Note Due on Visit 10    PT Start Time 1015    PT Stop Time 1059    PT Time Calculation (min) 44 min    Equipment Utilized During Treatment Gait belt    Activity Tolerance Patient tolerated treatment well;No increased pain;Patient limited by fatigue    Behavior During Therapy [facility] for tasks assessed/performed                    Past Medical History:  Diagnosis Date   Allergy    Anemia    Bell's palsy    Diabetes mellitus without complication ([place])    diet controlled   Hypertension    Hypothyroidism    Kidney stones    Pseudotumor cerebri    Stroke [facility])    Past Surgical History:  Procedure Laterality Date   COLONOSCOPY WITH PROPOFOL N/A 03/30/2020   Procedure: COLONOSCOPY WITH PROPOFOL;  Surgeon: Lesly Rubenstein, MD;  Location: [facility] ENDOSCOPY;  Service: Endoscopy;  Laterality: N/A;   LOOP RECORDER INSERTION N/A 01/27/2018   Procedure: LOOP RECORDER INSERTION;  Surgeon: [name], MD;  Location: [place] [facility];  Service: Cardiovascular;  Laterality: N/A;   LUMBAR PUNCTURE     as child   NO PAST SURGERIES     TEE WITHOUT CARDIOVERSION N/A 01/07/2018   Procedure: TRANSESOPHAGEAL ECHOCARDIOGRAM (TEE);  Surgeon: Minna Merritts, MD;  Location: [facility] ORS;  Service: Cardiovascular;  Laterality: N/A;   Patient Active Problem List   Diagnosis Date Noted   Hydronephrosis, left 05/04/2022   Type II diabetes mellitus with renal manifestations ([place]) 05/04/2022    Leukocytosis 05/04/2022   Pre-op evaluation 04/24/2022   Headache 07/07/2021   Hearing loss 07/07/2021   Open wound 01/25/2021   History of colon polyps 10/15/2020   Postoperative hemorrhage involving digestive system following digestive system procedure 09/19/2020   Acute cholecystitis without calculus 09/09/2020   Type 2 diabetes mellitus, with long-term current use of insulin ([place]) 09/09/2020   Cryptogenic stroke ([place]) 07/13/2020   History of loop recorder 07/13/2020   Weakness 06/18/2020   History of 2019 novel coronavirus disease (COVID-19) 05/20/2020   ESRD (end stage renal disease) ([place]) 04/04/2020   Pneumonia due to COVID-19 virus 04/03/2020   AKI (acute kidney injury) ([place]) 04/03/2020   Elevated troponin 04/03/2020   Acquired trigger finger 06/08/2019   Lymphedema 06/08/2019   Anemia 04/17/2019   Swelling of left lower extremity 01/10/2019   Facial droop 04/09/2018   Daytime somnolence 03/30/2018   Carotid artery disease ([place]) 02/03/2018   Intracranial vascular stenosis 09/12/2017   Cough 01/20/2017   Bell's palsy 11/10/2016   History of CVA (cerebrovascular accident) 11/10/2016   Benign localized hyperplasia of prostate with urinary obstruction 10/27/2016   History of nephrolithiasis 10/27/2016   TIA (transient ischemic attack) 10/20/2016   Near syncope 06/23/2016   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   Neuropathy 08/06/2015   Health care maintenance 08/06/2015   Essential hypertension  08/06/2015   Heme positive stool 10/10/2013   Hypothyroidism 10/10/2013   Microalbuminuria 10/10/2013   Hyperlipidemia 10/10/2013   B12 deficiency 10/10/2013   Diabetes ([place]) 07/04/2013   Environmental allergies 07/04/2013   ONSET DATE: 2-3 years  REFERRING DIAG: Neuropathy  THERAPY DIAG:  Difficulty in walking, not elsewhere classified  Muscle weakness (generalized)  Unsteadiness on feet  Abnormality of gait and mobility  Rationale for Evaluation and Treatment  Rehabilitation  SUBJECTIVE:                                                                                                                                                                                             SUBJECTIVE STATEMENT:  Patient is able to walk down to PT clinic without his cane today.    Pt accompanied by: self  PERTINENT HISTORY: Patient presents to physical therapy for neuropathy. He underwent banding of his L UE fistula on 05/13/22 and scheduled for additional banding on 06/07/22. 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 Patient reports his balance is very unsteady due to neuropathy in feet, feels weak in LE's.   PAIN:  Are you having pain? Yes: NPRS scale: 98/10 Pain location: L foot Pain description: sharp  Aggravating factors: worse at night Relieving factors: lyrica   PRECAUTIONS: Fall  WEIGHT BEARING RESTRICTIONS Yes no lifting >5 lb in arm   FALLS: Has patient fallen in last 6 months? No  LIVING ENVIRONMENT: Lives with: lives alone Lives in: House/apartment Stairs: Yes: Internal: flight steps; on right going up Has following equipment at home: Single point cane and Walker - 2 wheeled  PLOF: Independent  PATIENT GOALS to be more steady and walk as normally as possible.   OBJECTIVE:   LOWER EXTREMITY MMT:    MMT Right Eval Left Eval  Hip flexion 4- 3+  Hip extension    Hip abduction 4- 4+  Hip adduction 4- 3+  Knee flexion 4 4-  Knee extension 4 4  Ankle dorsiflexion 2+ 2+  Ankle plantarflexion 3+ 3+  (Blank rows = not tested)  FUNCTIONAL TESTs:  5 times sit to stand: 19.6 seconds with walking stick; one LOB 10 meter walk test: 12 seconds Berg Balance Scale: 33/56  PATIENT SURVEYS:  ABC scale 67% FOTO 53%  TODAY'S TREATMENT:     TREATMENT:   Nustep Lvl 7 SPM >60 for 4 minutes for cardiovascular challenge   Precore leg press; assistance for legs on plate #55 first trial 10x; #70 second trial 10x ; #  85 third trial 10x   PreCore single press: #25; 10x LLE #40 10x RLE   Standing: Incline stand 30 seconds  Airex pad: visual scan with reaching with dual task word game x 7 minutes Airex pad: march with BUE support  Seated  -prostretch df/pf 20x -lateral step outs 10x each side  PATIENT EDUCATION: Education details: HEP, goals, POC Person educated: Patient Education method: Explanation, Demonstration, Tactile cues, Verbal cues, and Handouts Education comprehension: verbalized understanding, returned demonstration, verbal cues required, and tactile cues required  HOME EXERCISE PROGRAM: Access Code: [email or web address] URL: [email or web address] Date: 06/03/2022 Prepared by: Janna Arch  Exercises - Seated Heel Toe Raises  - 1 x daily - 7 x weekly - 2 sets - 10 reps - 5 hold - Standing Tandem Balance with Counter Support  - 1 x daily - 7 x weekly - 2 sets - 2 reps - 30 hold - Standing March with Counter Support  - 1 x daily - 7 x weekly - 2 sets - 10 reps - 5 hold   GOALS: Goals reviewed with patient? Yes   SHORT TERM GOALS: Target date: 07/01/2022  Patient will be independent in home exercise program to improve strength/mobility for better functional independence with ADLs. Baseline:10/9; HEP given  Goal status: INITIAL   LONG TERM GOALS: Target date: 11/13/2022  Patient will increase FOTO score to equal to or greater than   63%  to demonstrate statistically significant improvement in mobility and quality of life.  Baseline: 10/9: 53% 11/29: 58% 12/27: 51% 1/17: 56%  Goal status: IN PROGRESS  2.  Patient (< 49 years old) will complete five times sit to stand test in < 10 seconds without UE support indicating an increased LE strength and improved balance. Baseline: 10/9: 19.6 seconds with walking stick; one  LOB 11/29: 20.56 seconds 12/27: 13.4 seconds hands on knees 1/17: 13 seconds hands on knees  Goal status: IN PROGRESS  3.  Patient will increase Berg Balance score by > 45/56 to demonstrate decreased fall risk during functional activities. Baseline: 10/9: 33/56 11/29: 43/56 12/27: 44/56 1/17:  44/56 Goal status: IN PROGRESS  4.  Patient will increase 10 meter walk test to >1.24ms as to improve gait speed for better community ambulation and to reduce fall risk. Baseline: 11/29: 1.19 m/s Goal status: MET  5.  Patient will increase ABC scale score >80% to demonstrate better functional mobility and better confidence with ADLs.  Baseline: 10/9: 67% 11/29: 75.6% 12/27: 78%  Goal status: IN PROGRESS  6. Patient will tolerate 5 seconds of single leg stance without loss of balance to improve ability to get in and out of shower safely. Baseline: 11/29: unable to stand on single leg safely 12/27: 2 seconds 1/17: 3 seconds Goal status: IN PROGRESS   6. Patient will increase six minute walk test distance to >1000 for progression to community ambulator and improve gait ability Baseline: 12/27: 845 ft with with walking stick 1/17: [id number] status: partially met    ASSESSMENT:  CLINICAL IMPRESSION:  Patient tolerates LE strengthening interventions this session with single leg press challenging for him. His LLE is weaker than his right which can be seen in weight difference for leg press.  Patient is highly motivated throughout session despite fatigue.  Patient will benefit from orthotic due to left foot over-pronation and instability; referral request sent to physician. Pt will continue to benefit from skilled therapy to address remaining deficits in order to improve overall QoL and return to PLOF.  OBJECTIVE IMPAIRMENTS Abnormal gait, decreased activity tolerance, decreased balance, decreased coordination, decreased endurance, decreased mobility, difficulty walking, decreased strength, impaired  flexibility, impaired sensation, improper body mechanics, and pain.   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   REHAB POTENTIAL: Good  CLINICAL DECISION MAKING: Evolving/moderate complexity  EVALUATION COMPLEXITY: Moderate  PLAN: PT FREQUENCY: 2x/week  PT DURATION: 12 weeks  PLANNED INTERVENTIONS: Therapeutic exercises, Therapeutic activity, Neuromuscular re-education, Balance training, Gait training, Patient/Family education, Self Care, Joint mobilization, Stair training, Vestibular training, Canalith repositioning, Visual/preceptual remediation/compensation, DME instructions, Dry Needling, Cognitive remediation, Spinal mobilization, Cryotherapy, Moist heat, Manual lymph drainage, Compression bandaging, Taping, Vasopneumatic device, Ultrasound, Manual therapy, and Re-evaluation  PLAN FOR NEXT SESSION: Continue to progress dynamic balance and dual tasking balance. Work on single leg balance progressions.     Janna Arch PT  Physical Therapist- [facility]  09/18/22, 10:59 AM

## 2022-09-18 ENCOUNTER — Ambulatory Visit: Payer: Medicare Other

## 2022-09-18 DIAGNOSIS — R2681 Unsteadiness on feet: Secondary | ICD-10-CM

## 2022-09-18 DIAGNOSIS — R269 Unspecified abnormalities of gait and mobility: Secondary | ICD-10-CM

## 2022-09-18 DIAGNOSIS — M6281 Muscle weakness (generalized): Secondary | ICD-10-CM

## 2022-09-18 DIAGNOSIS — R262 Difficulty in walking, not elsewhere classified: Secondary | ICD-10-CM

## 2022-09-19 NOTE — Therapy (Signed)
OUTPATIENT PHYSICAL THERAPY NEURO TREATMENT   Patient Name: Gregory Crane MRN: [id number] DOB:02-06-1971, 52 y.o., male Today's Date: 09/23/2022  PCP: [name] MD REFERRING PROVIDER: [name] MD  PT End of Session - 09/23/22 1006     Visit Number 23    Number of Visits 40    Date for PT Re-Evaluation 11/13/22    Authorization Type Medicare A&B; Medicaid; 09/11/22    Authorization Time Period 06/24/22-12/121/23 for 12 visits    Authorization - Number of Visits 12    Progress Note Due on Visit 10    PT Start Time 1010    PT Stop Time 1050    PT Time Calculation (min) 40 min    Equipment Utilized During Treatment Gait belt    Activity Tolerance Patient tolerated treatment well;No increased pain;Patient limited by fatigue    Behavior During Therapy [facility] for tasks assessed/performed                     Past Medical History:  Diagnosis Date   Allergy    Anemia    Bell's palsy    Diabetes mellitus without complication ([place])    diet controlled   Hypertension    Hypothyroidism    Kidney stones    Pseudotumor cerebri    Stroke [facility])    Past Surgical History:  Procedure Laterality Date   COLONOSCOPY WITH PROPOFOL N/A 03/30/2020   Procedure: COLONOSCOPY WITH PROPOFOL;  Surgeon: Lesly Rubenstein, MD;  Location: [facility] ENDOSCOPY;  Service: Endoscopy;  Laterality: N/A;   LOOP RECORDER INSERTION N/A 01/27/2018   Procedure: LOOP RECORDER INSERTION;  Surgeon: [name], MD;  Location: [place] [facility];  Service: Cardiovascular;  Laterality: N/A;   LUMBAR PUNCTURE     as child   NO PAST SURGERIES     TEE WITHOUT CARDIOVERSION N/A 01/07/2018   Procedure: TRANSESOPHAGEAL ECHOCARDIOGRAM (TEE);  Surgeon: Minna Merritts, MD;  Location: [facility] ORS;  Service: Cardiovascular;  Laterality: N/A;   Patient Active Problem List   Diagnosis Date Noted   Hydronephrosis, left 05/04/2022   Type II diabetes mellitus with renal manifestations ([place]) 05/04/2022    Leukocytosis 05/04/2022   Pre-op evaluation 04/24/2022   Headache 07/07/2021   Hearing loss 07/07/2021   Open wound 01/25/2021   History of colon polyps 10/15/2020   Postoperative hemorrhage involving digestive system following digestive system procedure 09/19/2020   Acute cholecystitis without calculus 09/09/2020   Type 2 diabetes mellitus, with long-term current use of insulin ([place]) 09/09/2020   Cryptogenic stroke ([place]) 07/13/2020   History of loop recorder 07/13/2020   Weakness 06/18/2020   History of 2019 novel coronavirus disease (COVID-19) 05/20/2020   ESRD (end stage renal disease) ([place]) 04/04/2020   Pneumonia due to COVID-19 virus 04/03/2020   AKI (acute kidney injury) ([place]) 04/03/2020   Elevated troponin 04/03/2020   Acquired trigger finger 06/08/2019   Lymphedema 06/08/2019   Anemia 04/17/2019   Swelling of left lower extremity 01/10/2019   Facial droop 04/09/2018   Daytime somnolence 03/30/2018   Carotid artery disease ([place]) 02/03/2018   Intracranial vascular stenosis 09/12/2017   Cough 01/20/2017   Bell's palsy 11/10/2016   History of CVA (cerebrovascular accident) 11/10/2016   Benign localized hyperplasia of prostate with urinary obstruction 10/27/2016   History of nephrolithiasis 10/27/2016   TIA (transient ischemic attack) 10/20/2016   Near syncope 06/23/2016   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   Neuropathy 08/06/2015   Health care maintenance 08/06/2015   Essential  hypertension 08/06/2015   Heme positive stool 10/10/2013   Hypothyroidism 10/10/2013   Microalbuminuria 10/10/2013   Hyperlipidemia 10/10/2013   B12 deficiency 10/10/2013   Diabetes ([place]) 07/04/2013   Environmental allergies 07/04/2013   ONSET DATE: 2-3 years  REFERRING DIAG: Neuropathy  THERAPY DIAG:  Difficulty in walking, not elsewhere classified  Muscle weakness (generalized)  Unsteadiness on feet  Abnormality of gait and mobility  Rationale for Evaluation and Treatment  Rehabilitation  SUBJECTIVE:                                                                                                                                                                                             SUBJECTIVE STATEMENT:  Patient reports he felt a little wobbly when waking up this morning. Has a busy day planned today.    Pt accompanied by: self  PERTINENT HISTORY: Patient presents to physical therapy for neuropathy. He underwent banding of his L UE fistula on 05/13/22 and scheduled for additional banding on 06/07/22. 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 Patient reports his balance is very unsteady due to neuropathy in feet, feels weak in LE's.   PAIN:  Are you having pain? Yes: NPRS scale: 98/10 Pain location: L foot Pain description: sharp  Aggravating factors: worse at night Relieving factors: lyrica   PRECAUTIONS: Fall  WEIGHT BEARING RESTRICTIONS Yes no lifting >5 lb in arm   FALLS: Has patient fallen in last 6 months? No  LIVING ENVIRONMENT: Lives with: lives alone Lives in: House/apartment Stairs: Yes: Internal: flight steps; on right going up Has following equipment at home: Single point cane and Walker - 2 wheeled  PLOF: Independent  PATIENT GOALS to be more steady and walk as normally as possible.   OBJECTIVE:   LOWER EXTREMITY MMT:    MMT Right Eval Left Eval  Hip flexion 4- 3+  Hip extension    Hip abduction 4- 4+  Hip adduction 4- 3+  Knee flexion 4 4-  Knee extension 4 4  Ankle dorsiflexion 2+ 2+  Ankle plantarflexion 3+ 3+  (Blank rows = not tested)  FUNCTIONAL TESTs:  5 times sit to stand: 19.6 seconds with walking stick; one LOB 10 meter walk test: 12 seconds Berg Balance Scale: 33/56  PATIENT SURVEYS:  ABC scale 67% FOTO  53%  TODAY'S TREATMENT:     TREATMENT:   Knee extension machine in [employer]: -BLE #3; 10x; 2 sets -single limb #1 8x eah LE; 2 sets   Standing:  Airex pad: march with BUE support Standing with CGA next to support surface:  Airex pad: static stand 30 seconds x 2 trials, noticeable trembling of ankles/LE's with fatigue and challenge to maintain stability Airex pad: horizontal head turns 30 seconds scanning room 10x ; cueing for arc of motion  Airex pad: vertical head turns 30 seconds, cueing for arc of motion, noticeable sway with upward gaze increasing demand on ankle righting reaction musculature Modified lateral lunge with BUE support 10x each LE    Seated  -prostretch df/pf 20x -lateral step outs 10x each side Soccer ball kicks for coordination and sequencing of muscle recruitment x 2 minutes;  Soccer ball kicks between cones for goals for progression of challenge ; 10 goals; x 2 sets   PATIENT EDUCATION: Education details: HEP, goals, POC Person educated: Patient Education method: Explanation, Demonstration, Tactile cues, Verbal cues, and Handouts Education comprehension: verbalized understanding, returned demonstration, verbal cues required, and tactile cues required  HOME EXERCISE PROGRAM: Access Code: [email or web address] URL: [email or web address] Date: 06/03/2022 Prepared by: Janna Arch  Exercises - Seated Heel Toe Raises  - 1 x daily - 7 x weekly - 2 sets - 10 reps - 5 hold - Standing Tandem Balance with Counter Support  - 1 x daily - 7 x weekly - 2 sets - 2 reps - 30 hold - Standing March with Counter Support  - 1 x daily - 7 x weekly - 2 sets - 10 reps - 5 hold   GOALS: Goals reviewed with patient? Yes   SHORT TERM GOALS: Target date: 07/01/2022  Patient will be independent in home exercise program to improve strength/mobility for better functional independence with ADLs. Baseline:10/9; HEP given  Goal status: INITIAL   LONG TERM GOALS: Target  date: 11/13/2022  Patient will increase FOTO score to equal to or greater than   63%  to demonstrate statistically significant improvement in mobility and quality of life.  Baseline: 10/9: 53% 11/29: 58% 12/27: 51% 1/17: 56%  Goal status: IN PROGRESS  2.  Patient (< 32 years old) will complete five times sit to stand test in < 10 seconds without UE support indicating an increased LE strength and improved balance. Baseline: 10/9: 19.6 seconds with walking stick; one LOB 11/29: 20.56 seconds 12/27: 13.4 seconds hands on knees 1/17: 13 seconds hands on knees  Goal status: IN PROGRESS  3.  Patient will increase Berg Balance score by > 45/56 to demonstrate decreased fall risk during functional activities. Baseline: 10/9: 33/56 11/29: 43/56 12/27: 44/56 1/17:  44/56 Goal status: IN PROGRESS  4.  Patient will increase 10 meter walk test to >1.20ms as to improve gait speed for better community ambulation and to reduce fall risk. Baseline: 11/29: 1.19 m/s Goal status: MET  5.  Patient will increase ABC scale score >80% to demonstrate better functional mobility and better confidence with ADLs.  Baseline: 10/9: 67% 11/29: 75.6% 12/27: 78%  Goal status: IN PROGRESS  6. Patient will tolerate 5 seconds of single leg stance without loss of balance to improve ability to get in and out of shower safely. Baseline: 11/29: unable to stand on single leg safely 12/27: 2 seconds 1/17: 3 seconds Goal status: IN PROGRESS   6. Patient will increase six minute walk test distance to >1000 for progression to community ambulator and improve gait ability Baseline: 12/27: 845 ft with with walking stick 1/17: [id number] status: partially met    ASSESSMENT:  CLINICAL IMPRESSION:  Patient presents with excellent motivation. Wants  to increase his independence and ability . Patient introduced to leg extension machine for quad strengthening and is fatigued with single leg activation. Stabilization on unstable surface  is improving but continues to be challenged with head turns. Patient is able to perform quick muscle activation techniques with soccer ball kicks without missing. Pt will continue to benefit from skilled therapy to address remaining deficits in order to improve overall QoL and return to PLOF.     OBJECTIVE IMPAIRMENTS Abnormal gait, decreased activity tolerance, decreased balance, decreased coordination, decreased endurance, decreased mobility, difficulty walking, decreased strength, impaired flexibility, impaired sensation, improper body mechanics, and pain.   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   REHAB POTENTIAL: Good  CLINICAL DECISION MAKING: Evolving/moderate complexity  EVALUATION COMPLEXITY: Moderate  PLAN: PT FREQUENCY: 2x/week  PT DURATION: 12 weeks  PLANNED INTERVENTIONS: Therapeutic exercises, Therapeutic activity, Neuromuscular re-education, Balance training, Gait training, Patient/Family education, Self Care, Joint mobilization, Stair training, Vestibular training, Canalith repositioning, Visual/preceptual remediation/compensation, DME instructions, Dry Needling, Cognitive remediation, Spinal mobilization, Cryotherapy, Moist heat, Manual lymph drainage,  Compression bandaging, Taping, Vasopneumatic device, Ultrasound, Manual therapy, and Re-evaluation  PLAN FOR NEXT SESSION: Continue to progress dynamic balance and dual tasking balance. Work on single leg balance progressions.     Janna Arch PT  Physical Therapist- [facility]  09/23/22, 10:59 AM

## 2022-09-23 ENCOUNTER — Ambulatory Visit (INDEPENDENT_AMBULATORY_CARE_PROVIDER_SITE_OTHER): Payer: Medicare Other | Admitting: Podiatry

## 2022-09-23 ENCOUNTER — Ambulatory Visit: Payer: Medicare Other

## 2022-09-23 ENCOUNTER — Ambulatory Visit: Payer: Medicare Other | Admitting: Podiatry

## 2022-09-23 DIAGNOSIS — B351 Tinea unguium: Secondary | ICD-10-CM

## 2022-09-23 DIAGNOSIS — M79675 Pain in left toe(s): Secondary | ICD-10-CM | POA: Diagnosis not present

## 2022-09-23 DIAGNOSIS — M79674 Pain in right toe(s): Secondary | ICD-10-CM

## 2022-09-23 DIAGNOSIS — L853 Xerosis cutis: Secondary | ICD-10-CM | POA: Diagnosis not present

## 2022-09-23 DIAGNOSIS — R269 Unspecified abnormalities of gait and mobility: Secondary | ICD-10-CM

## 2022-09-23 DIAGNOSIS — R262 Difficulty in walking, not elsewhere classified: Secondary | ICD-10-CM

## 2022-09-23 DIAGNOSIS — M6281 Muscle weakness (generalized): Secondary | ICD-10-CM

## 2022-09-23 DIAGNOSIS — R2681 Unsteadiness on feet: Secondary | ICD-10-CM

## 2022-09-23 MED ORDER — AMMONIUM LACTATE 12 % EX CREA: 1.0000 | TOPICAL_CREAM | CUTANEOUS | 6 refills | Status: DC | PRN

## 2022-09-24 NOTE — Therapy (Signed)
OUTPATIENT PHYSICAL THERAPY NEURO TREATMENT   Patient Name: Gregory Crane MRN: [id number] DOB:13-Jan-1971, 52 y.o., male Today's Date: 09/25/2022  PCP: [name] MD REFERRING PROVIDER: [name] MD  PT End of Session - 09/25/22 1010     Visit Number 24    Number of Visits 40    Date for PT Re-Evaluation 11/13/22    Authorization Type Medicare A&B; Medicaid; 09/11/22    Authorization Time Period 06/24/22-12/121/23 for 12 visits    Authorization - Number of Visits 12    Progress Note Due on Visit 10    PT Start Time 1015    PT Stop Time 1059    PT Time Calculation (min) 44 min    Equipment Utilized During Treatment Gait belt    Activity Tolerance Patient tolerated treatment well;No increased pain;Patient limited by fatigue    Behavior During Therapy [facility] for tasks assessed/performed                      Past Medical History:  Diagnosis Date   Allergy    Anemia    Bell's palsy    Diabetes mellitus without complication ([place])    diet controlled   Hypertension    Hypothyroidism    Kidney stones    Pseudotumor cerebri    Stroke [facility])    Past Surgical History:  Procedure Laterality Date   COLONOSCOPY WITH PROPOFOL N/A 03/30/2020   Procedure: COLONOSCOPY WITH PROPOFOL;  Surgeon: Lesly Rubenstein, MD;  Location: [facility] ENDOSCOPY;  Service: Endoscopy;  Laterality: N/A;   LOOP RECORDER INSERTION N/A 01/27/2018   Procedure: LOOP RECORDER INSERTION;  Surgeon: [name], MD;  Location: [place] CV LAB;  Service: Cardiovascular;  Laterality: N/A;   LUMBAR PUNCTURE     as child   NO PAST SURGERIES     TEE WITHOUT CARDIOVERSION N/A 01/07/2018   Procedure: TRANSESOPHAGEAL ECHOCARDIOGRAM (TEE);  Surgeon: Minna Merritts, MD;  Location: [facility] ORS;  Service: Cardiovascular;  Laterality: N/A;   Patient Active Problem List   Diagnosis Date Noted   Hydronephrosis, left 05/04/2022   Type II diabetes mellitus with renal manifestations ([place]) 05/04/2022    Leukocytosis 05/04/2022   Pre-op evaluation 04/24/2022   Headache 07/07/2021   Hearing loss 07/07/2021   Open wound 01/25/2021   History of colon polyps 10/15/2020   Postoperative hemorrhage involving digestive system following digestive system procedure 09/19/2020   Acute cholecystitis without calculus 09/09/2020   Type 2 diabetes mellitus, with long-term current use of insulin ([place]) 09/09/2020   Cryptogenic stroke ([place]) 07/13/2020   History of loop recorder 07/13/2020   Weakness 06/18/2020   History of 2019 novel coronavirus disease (COVID-19) 05/20/2020   ESRD (end stage renal disease) ([place]) 04/04/2020   Pneumonia due to COVID-19 virus 04/03/2020   AKI (acute kidney injury) ([place]) 04/03/2020   Elevated troponin 04/03/2020   Acquired trigger finger 06/08/2019   Lymphedema 06/08/2019   Anemia 04/17/2019   Swelling of left lower extremity 01/10/2019   Facial droop 04/09/2018   Daytime somnolence 03/30/2018   Carotid artery disease ([place]) 02/03/2018   Intracranial vascular stenosis 09/12/2017   Cough 01/20/2017   Bell's palsy 11/10/2016   History of CVA (cerebrovascular accident) 11/10/2016   Benign localized hyperplasia of prostate with urinary obstruction 10/27/2016   History of nephrolithiasis 10/27/2016   TIA (transient ischemic attack) 10/20/2016   Near syncope 06/23/2016   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   Neuropathy 08/06/2015   Health care maintenance 08/06/2015  Essential hypertension 08/06/2015   Heme positive stool 10/10/2013   Hypothyroidism 10/10/2013   Microalbuminuria 10/10/2013   Hyperlipidemia 10/10/2013   B12 deficiency 10/10/2013   Diabetes ([place]) 07/04/2013   Environmental allergies 07/04/2013   ONSET DATE: 2-3 years  REFERRING DIAG: Neuropathy  THERAPY DIAG:  Difficulty in walking, not elsewhere classified  Muscle weakness (generalized)  Unsteadiness on feet  Rationale for Evaluation and Treatment Rehabilitation  SUBJECTIVE:                                                                                                                                                                                              SUBJECTIVE STATEMENT: Patient was sore after last session. No falls or LOB.    Pt accompanied by: self  PERTINENT HISTORY: Patient presents to physical therapy for neuropathy. He underwent banding of his L UE fistula on 05/13/22 and scheduled for additional banding on 06/07/22. 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 Patient reports his balance is very unsteady due to neuropathy in feet, feels weak in LE's.   PAIN:  Are you having pain? Yes: NPRS scale: 98/10 Pain location: L foot Pain description: sharp  Aggravating factors: worse at night Relieving factors: lyrica   PRECAUTIONS: Fall  WEIGHT BEARING RESTRICTIONS Yes no lifting >5 lb in arm   FALLS: Has patient fallen in last 6 months? No  LIVING ENVIRONMENT: Lives with: lives alone Lives in: House/apartment Stairs: Yes: Internal: flight steps; on right going up Has following equipment at home: Single point cane and Walker - 2 wheeled  PLOF: Independent  PATIENT GOALS to be more steady and walk as normally as possible.   OBJECTIVE:   LOWER EXTREMITY MMT:    MMT Right Eval Left Eval  Hip flexion 4- 3+  Hip extension    Hip abduction 4- 4+  Hip adduction 4- 3+  Knee flexion 4 4-  Knee extension 4 4  Ankle dorsiflexion 2+ 2+  Ankle plantarflexion 3+ 3+  (Blank rows = not tested)  FUNCTIONAL TESTs:  5 times sit to stand: 19.6 seconds with walking stick; one LOB 10 meter walk test: 12 seconds Berg Balance Scale: 33/56  PATIENT SURVEYS:  ABC scale 67% FOTO 53%  TODAY'S TREATMENT:     TREATMENT:    Neuro re-ed  In // bars:   Airex balance beam: -lateral  step 6x length of // bars ; finger tip support -tandem walking 6x length of //bars; very challenging with heavy UE support -static  stand 30 seconds no UE support x 2 trials  3 cone tap with UE support 8x each LE  Tilt board: anterior posterior weight shift 10x each LE placement    TherEx:  RTB across bars:  -march 15x each LE  Heel raise 20x  Seated  6" step toe taps 30 seconds x2 trials Lateral step over hedgehog 10x each LE  Adduction ball squeeze 15x    PATIENT EDUCATION: Education details: HEP, goals, POC Person educated: Patient Education method: Explanation, Demonstration, Tactile cues, Verbal cues, and Handouts Education comprehension: verbalized understanding, returned demonstration, verbal cues required, and tactile cues required  HOME EXERCISE PROGRAM: Access Code: [email or web address] URL: [email or web address] Date: 06/03/2022 Prepared by: Janna Arch  Exercises - Seated Heel Toe Raises  - 1 x daily - 7 x weekly - 2 sets - 10 reps - 5 hold - Standing Tandem Balance with Counter Support  - 1 x daily - 7 x weekly - 2 sets - 2 reps - 30 hold - Standing March with Counter Support  - 1 x daily - 7 x weekly - 2 sets - 10 reps - 5 hold   GOALS: Goals reviewed with patient? Yes   SHORT TERM GOALS: Target date: 07/01/2022  Patient will be independent in home exercise program to improve strength/mobility for better functional independence with ADLs. Baseline:10/9; HEP given  Goal status: INITIAL   LONG TERM GOALS: Target date: 11/13/2022  Patient will increase FOTO score to equal to or greater than   63%  to demonstrate statistically significant improvement in mobility and quality of life.  Baseline: 10/9: 53% 11/29: 58% 12/27: 51% 1/17: 56%  Goal status: IN PROGRESS  2.  Patient (< 73 years old) will complete five times sit to stand test in < 10 seconds without UE support indicating an increased LE strength and improved balance. Baseline: 10/9: 19.6 seconds  with walking stick; one LOB 11/29: 20.56 seconds 12/27: 13.4 seconds hands on knees 1/17: 13 seconds hands on knees  Goal status: IN PROGRESS  3.  Patient will increase Berg Balance score by > 45/56 to demonstrate decreased fall risk during functional activities. Baseline: 10/9: 33/56 11/29: 43/56 12/27: 44/56 1/17:  44/56 Goal status: IN PROGRESS  4.  Patient will increase 10 meter walk test to >1.78ms as to improve gait speed for better community ambulation and to reduce fall risk. Baseline: 11/29: 1.19 m/s Goal status: MET  5.  Patient will increase ABC scale score >80% to demonstrate better functional mobility and better confidence with ADLs.  Baseline: 10/9: 67% 11/29: 75.6% 12/27: 78%  Goal status: IN PROGRESS  6. Patient will tolerate 5 seconds of single leg stance without loss of balance to improve ability to get in and out of shower safely. Baseline: 11/29: unable to stand on single leg safely 12/27: 2 seconds 1/17: 3 seconds Goal status: IN PROGRESS   6. Patient will increase six minute walk test distance to >1000 for progression to community ambulator and improve gait ability Baseline: 12/27: 845 ft with with walking stick 1/17: [id number] status: partially met    ASSESSMENT:  CLINICAL IMPRESSION:  Patient tolerates stabilization interventions on unstable surfaces with minimal LOB however he does continue to require intermittent UE support. Patient is highly motivated throughout session. LE coordination is improving with repetition and carryover between sessions. He does fatigue with repeated LE activation requiring rest breaks.  Pt will continue to benefit from skilled therapy to address remaining deficits in order to improve overall QoL  and return to PLOF.     OBJECTIVE IMPAIRMENTS Abnormal gait, decreased activity tolerance, decreased balance, decreased coordination, decreased endurance, decreased mobility, difficulty walking, decreased strength, impaired flexibility,  impaired sensation, improper body mechanics, and pain.   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   REHAB POTENTIAL: Good  CLINICAL DECISION MAKING: Evolving/moderate complexity  EVALUATION COMPLEXITY: Moderate  PLAN: PT FREQUENCY: 2x/week  PT DURATION: 12 weeks  PLANNED INTERVENTIONS: Therapeutic exercises, Therapeutic activity, Neuromuscular re-education, Balance training, Gait training, Patient/Family education, Self Care, Joint mobilization, Stair training, Vestibular training, Canalith repositioning, Visual/preceptual remediation/compensation, DME instructions, Dry Needling, Cognitive remediation, Spinal mobilization, Cryotherapy, Moist heat, Manual lymph drainage, Compression bandaging, Taping, Vasopneumatic device, Ultrasound, Manual therapy, and Re-evaluation  PLAN FOR NEXT SESSION: Continue to progress dynamic balance and dual tasking balance. Work on single leg balance progressions.     Janna Arch PT  Physical Therapist- [facility]  09/25/22, 11:01 AM

## 2022-09-24 NOTE — Progress Notes (Signed)
  Subjective:  Patient ID: Randee Upchurch, male    DOB: [birth year],  MRN: [id number]  Chief Complaint  Patient presents with   Nail Problem    Thick painful toenails, 3 month follow up    52 y.o. male presents with the above complaint. History confirmed with patient.  He has severely dry skin and wants to know if there is anything can be done for this  Objective:  Physical Exam: warm, good capillary refill, no trophic changes or ulcerative lesions, normal DP and PT pulses, and normal sensory exam.  Dry scaling skin bilateral Left Foot: dystrophic yellowed discolored nail plates with subungual debris Right Foot: dystrophic yellowed discolored nail plates with subungual debris  Assessment:   1. Pain due to onychomycosis of toenails of both feet   2. Xerosis cutis      Plan:  Patient was evaluated and treated and all questions answered.  Discussed the etiology and treatment options for the condition in detail with the patient. Educated patient on the topical and oral treatment options for mycotic nails. Recommended debridement of the nails today. Sharp and mechanical debridement performed of all painful and mycotic nails today. Nails debrided in length and thickness using a nail nipper to level of comfort. Discussed treatment options including appropriate shoe gear. Follow up as needed for painful nails.  We discussed foot hygiene and treatment for xerosis cutis.  I recommended ammonium lactate cream and Rx was sent to pharmacy we discussed the proper use of this.  Return in about 3 months (around 12/23/2022) for at risk diabetic foot care.

## 2022-09-25 ENCOUNTER — Ambulatory Visit: Payer: Medicare Other

## 2022-09-25 DIAGNOSIS — M6281 Muscle weakness (generalized): Secondary | ICD-10-CM

## 2022-09-25 DIAGNOSIS — R262 Difficulty in walking, not elsewhere classified: Secondary | ICD-10-CM

## 2022-09-25 DIAGNOSIS — R2681 Unsteadiness on feet: Secondary | ICD-10-CM

## 2022-09-26 NOTE — Therapy (Signed)
OUTPATIENT PHYSICAL THERAPY NEURO TREATMENT   Patient Name: Gregory Crane MRN: [id number] DOB:September 25, 1970, 52 y.o., male Today's Date: 09/30/2022  PCP: [name] MD REFERRING PROVIDER: [name] MD  PT End of Session - 09/30/22 1016     Visit Number 25    Number of Visits 40    Date for PT Re-Evaluation 11/13/22    Authorization Type Medicare A&B; Medicaid; 09/11/22    Authorization Time Period 06/24/22-12/121/23 for 12 visits    Authorization - Number of Visits 12    Progress Note Due on Visit 10    PT Start Time 1016    PT Stop Time 1059    PT Time Calculation (min) 43 min    Equipment Utilized During Treatment Gait belt    Activity Tolerance Patient tolerated treatment well;No increased pain;Patient limited by fatigue    Behavior During Therapy [facility] for tasks assessed/performed                       Past Medical History:  Diagnosis Date   Allergy    Anemia    Bell's palsy    Diabetes mellitus without complication ([place])    diet controlled   Hypertension    Hypothyroidism    Kidney stones    Pseudotumor cerebri    Stroke [facility])    Past Surgical History:  Procedure Laterality Date   COLONOSCOPY WITH PROPOFOL N/A 03/30/2020   Procedure: COLONOSCOPY WITH PROPOFOL;  Surgeon: Lesly Rubenstein, MD;  Location: [facility] ENDOSCOPY;  Service: Endoscopy;  Laterality: N/A;   LOOP RECORDER INSERTION N/A 01/27/2018   Procedure: LOOP RECORDER INSERTION;  Surgeon: [name], MD;  Location: [place] CV LAB;  Service: Cardiovascular;  Laterality: N/A;   LUMBAR PUNCTURE     as child   NO PAST SURGERIES     TEE WITHOUT CARDIOVERSION N/A 01/07/2018   Procedure: TRANSESOPHAGEAL ECHOCARDIOGRAM (TEE);  Surgeon: Minna Merritts, MD;  Location: [facility] ORS;  Service: Cardiovascular;  Laterality: N/A;   Patient Active Problem List   Diagnosis Date Noted   Hydronephrosis, left 05/04/2022   Type II diabetes mellitus with renal manifestations ([place]) 05/04/2022    Leukocytosis 05/04/2022   Pre-op evaluation 04/24/2022   Headache 07/07/2021   Hearing loss 07/07/2021   Open wound 01/25/2021   History of colon polyps 10/15/2020   Postoperative hemorrhage involving digestive system following digestive system procedure 09/19/2020   Acute cholecystitis without calculus 09/09/2020   Type 2 diabetes mellitus, with long-term current use of insulin ([place]) 09/09/2020   Cryptogenic stroke ([place]) 07/13/2020   History of loop recorder 07/13/2020   Weakness 06/18/2020   History of 2019 novel coronavirus disease (COVID-19) 05/20/2020   ESRD (end stage renal disease) ([place]) 04/04/2020   Pneumonia due to COVID-19 virus 04/03/2020   AKI (acute kidney injury) ([place]) 04/03/2020   Elevated troponin 04/03/2020   Acquired trigger finger 06/08/2019   Lymphedema 06/08/2019   Anemia 04/17/2019   Swelling of left lower extremity 01/10/2019   Facial droop 04/09/2018   Daytime somnolence 03/30/2018   Carotid artery disease ([place]) 02/03/2018   Intracranial vascular stenosis 09/12/2017   Cough 01/20/2017   Bell's palsy 11/10/2016   History of CVA (cerebrovascular accident) 11/10/2016   Benign localized hyperplasia of prostate with urinary obstruction 10/27/2016   History of nephrolithiasis 10/27/2016   TIA (transient ischemic attack) 10/20/2016   Near syncope 06/23/2016   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   Neuropathy 08/06/2015   Health care maintenance 08/06/2015  Essential hypertension 08/06/2015   Heme positive stool 10/10/2013   Hypothyroidism 10/10/2013   Microalbuminuria 10/10/2013   Hyperlipidemia 10/10/2013   B12 deficiency 10/10/2013   Diabetes ([place]) 07/04/2013   Environmental allergies 07/04/2013   ONSET DATE: 2-3 years  REFERRING DIAG: Neuropathy  THERAPY DIAG:  Difficulty in walking, not elsewhere classified  Muscle weakness (generalized)  Unsteadiness on feet  Rationale for Evaluation and Treatment Rehabilitation  SUBJECTIVE:                                                                                                                                                                                              SUBJECTIVE STATEMENT: Patient reports no pains or LOB since last session. Found out his orthotic won't be covered by insurance.    Pt accompanied by: self  PERTINENT HISTORY: Patient presents to physical therapy for neuropathy. He underwent banding of his L UE fistula on 05/13/22 and scheduled for additional banding on 06/07/22. 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 Patient reports his balance is very unsteady due to neuropathy in feet, feels weak in LE's.   PAIN:  Are you having pain? Yes: NPRS scale: 0/10 Pain location: L foot Pain description: sharp  Aggravating factors: worse at night Relieving factors: lyrica   PRECAUTIONS: Fall  WEIGHT BEARING RESTRICTIONS Yes no lifting >5 lb in arm   FALLS: Has patient fallen in last 6 months? No  LIVING ENVIRONMENT: Lives with: lives alone Lives in: House/apartment Stairs: Yes: Internal: flight steps; on right going up Has following equipment at home: Single point cane and Walker - 2 wheeled  PLOF: Independent  PATIENT GOALS to be more steady and walk as normally as possible.   OBJECTIVE:   LOWER EXTREMITY MMT:    MMT Right Eval Left Eval  Hip flexion 4- 3+  Hip extension    Hip abduction 4- 4+  Hip adduction 4- 3+  Knee flexion 4 4-  Knee extension 4 4  Ankle dorsiflexion 2+ 2+  Ankle plantarflexion 3+ 3+  (Blank rows = not tested)  FUNCTIONAL TESTs:  5 times sit to stand: 19.6 seconds with walking stick; one LOB 10 meter walk test: 12 seconds Berg Balance Scale: 33/56  PATIENT SURVEYS:  ABC scale 67% FOTO 53%  TODAY'S TREATMENT:     TREATMENT:    Neuro  re-ed  Single limb stance with intermittent UE support 30 seconds each LE Tandem stance 30 seconds each LE placement Airex pad: static stand 30 seconds  6" step eccentric heel taps 10x each LE  TherEx:  6" step: -toe taps 10x each LE  -step up/down 10x each LE; UE support : (LLE buckling with fatigue) -lateral step up/down 10x each LE; UE support  Seated  Lateral step over hedgehog 15x each LE  Adduction ball squeeze 15x  5lb ankle weights: -seated march 15x each LE  -LAQ 12x each LE  -heel raise 20x -ankle circles 10x clockwise, 10x counterclockwise  Sit to stand 10x  PATIENT EDUCATION: Education details: HEP, goals, POC Person educated: Patient Education method: Explanation, Demonstration, Tactile cues, Verbal cues, and Handouts Education comprehension: verbalized understanding, returned demonstration, verbal cues required, and tactile cues required  HOME EXERCISE PROGRAM: Access Code: [email or web address] URL: [email or web address] Date: 06/03/2022 Prepared by: Janna Arch  Exercises - Seated Heel Toe Raises  - 1 x daily - 7 x weekly - 2 sets - 10 reps - 5 hold - Standing Tandem Balance with Counter Support  - 1 x daily - 7 x weekly - 2 sets - 2 reps - 30 hold - Standing March with Counter Support  - 1 x daily - 7 x weekly - 2 sets - 10 reps - 5 hold   GOALS: Goals reviewed with patient? Yes   SHORT TERM GOALS: Target date: 07/01/2022  Patient will be independent in home exercise program to improve strength/mobility for better functional independence with ADLs. Baseline:10/9; HEP given  Goal status: INITIAL   LONG TERM GOALS: Target date: 11/13/2022  Patient will increase FOTO score to equal to or greater than   63%  to demonstrate statistically significant improvement in mobility and quality of life.  Baseline: 10/9: 53% 11/29: 58% 12/27: 51% 1/17: 56%  Goal status: IN PROGRESS  2.  Patient (< 18 years old) will complete five times sit to stand test in <  10 seconds without UE support indicating an increased LE strength and improved balance. Baseline: 10/9: 19.6 seconds with walking stick; one LOB 11/29: 20.56 seconds 12/27: 13.4 seconds hands on knees 1/17: 13 seconds hands on knees  Goal status: IN PROGRESS  3.  Patient will increase Berg Balance score by > 45/56 to demonstrate decreased fall risk during functional activities. Baseline: 10/9: 33/56 11/29: 43/56 12/27: 44/56 1/17:  44/56 Goal status: IN PROGRESS  4.  Patient will increase 10 meter walk test to >1.76ms as to improve gait speed for better community ambulation and to reduce fall risk. Baseline: 11/29: 1.19 m/s Goal status: MET  5.  Patient will increase ABC scale score >80% to demonstrate better functional mobility and better confidence with ADLs.  Baseline: 10/9: 67% 11/29: 75.6% 12/27: 78%  Goal status: IN PROGRESS  6. Patient will tolerate 5 seconds of single leg stance without loss of balance to improve ability to get in and out of shower safely. Baseline: 11/29: unable to stand on single leg safely 12/27: 2 seconds 1/17: 3 seconds Goal status: IN PROGRESS   6. Patient will increase six minute walk test distance to >1000 for progression to community ambulator and improve gait ability Baseline: 12/27: 845 ft with with walking stick 1/17: [id number] status: partially met    ASSESSMENT:  CLINICAL IMPRESSION:  Patient presents with good motivation throughout physical therapy. Has L leg buckling with fatigue with steps this session.  Eccentric heel taps are very challenging for patient. Patient does have some instability by end of session due to fatigue but improved with rest breaks. Pt will continue to benefit from skilled therapy to  address remaining deficits in order to improve overall QoL and return to PLOF.     OBJECTIVE IMPAIRMENTS Abnormal gait, decreased activity tolerance, decreased balance, decreased coordination, decreased endurance, decreased mobility,  difficulty walking, decreased strength, impaired flexibility, impaired sensation, improper body mechanics, and pain.   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   REHAB POTENTIAL: Good  CLINICAL DECISION MAKING: Evolving/moderate complexity  EVALUATION COMPLEXITY: Moderate  PLAN: PT FREQUENCY: 2x/week  PT DURATION: 12 weeks  PLANNED INTERVENTIONS: Therapeutic exercises, Therapeutic activity, Neuromuscular re-education, Balance training, Gait training, Patient/Family education, Self Care, Joint mobilization, Stair training, Vestibular training, Canalith repositioning, Visual/preceptual remediation/compensation, DME instructions, Dry Needling, Cognitive remediation, Spinal mobilization, Cryotherapy, Moist heat, Manual lymph drainage, Compression bandaging, Taping, Vasopneumatic device, Ultrasound, Manual therapy, and Re-evaluation  PLAN FOR NEXT SESSION: Continue to progress dynamic balance and dual tasking balance. Work on single leg balance progressions.     Janna Arch PT  Physical Therapist- [facility]  09/30/22, 10:59 AM

## 2022-09-30 ENCOUNTER — Ambulatory Visit: Payer: Medicare Other | Attending: Internal Medicine

## 2022-09-30 DIAGNOSIS — R278 Other lack of coordination: Secondary | ICD-10-CM | POA: Diagnosis present

## 2022-09-30 DIAGNOSIS — R269 Unspecified abnormalities of gait and mobility: Secondary | ICD-10-CM | POA: Diagnosis present

## 2022-09-30 DIAGNOSIS — R262 Difficulty in walking, not elsewhere classified: Secondary | ICD-10-CM | POA: Diagnosis present

## 2022-09-30 DIAGNOSIS — R2681 Unsteadiness on feet: Secondary | ICD-10-CM | POA: Diagnosis present

## 2022-09-30 DIAGNOSIS — M6281 Muscle weakness (generalized): Secondary | ICD-10-CM | POA: Insufficient documentation

## 2022-10-01 NOTE — Therapy (Signed)
OUTPATIENT PHYSICAL THERAPY NEURO TREATMENT   Patient Name: Gregory Crane MRN: [id number] DOB:1970-10-02, 52 y.o., male Today's Date: 10/02/2022  PCP: [name] MD REFERRING PROVIDER: [name] MD  PT End of Session - 10/02/22 1016     Visit Number 26    Number of Visits 40    Date for PT Re-Evaluation 11/13/22    Authorization Type Medicare A&B; Medicaid; 09/11/22    Authorization Time Period 06/24/22-12/121/23 for 12 visits    Authorization - Number of Visits 12    Progress Note Due on Visit 10    PT Start Time 1015    PT Stop Time 1059    PT Time Calculation (min) 44 min    Equipment Utilized During Treatment Gait belt    Activity Tolerance Patient tolerated treatment well;No increased pain;Patient limited by fatigue    Behavior During Therapy [facility] for tasks assessed/performed                        Past Medical History:  Diagnosis Date   Allergy    Anemia    Bell's palsy    Diabetes mellitus without complication ([place])    diet controlled   Hypertension    Hypothyroidism    Kidney stones    Pseudotumor cerebri    Stroke [facility])    Past Surgical History:  Procedure Laterality Date   COLONOSCOPY WITH PROPOFOL N/A 03/30/2020   Procedure: COLONOSCOPY WITH PROPOFOL;  Surgeon: Lesly Rubenstein, MD;  Location: [facility] ENDOSCOPY;  Service: Endoscopy;  Laterality: N/A;   LOOP RECORDER INSERTION N/A 01/27/2018   Procedure: LOOP RECORDER INSERTION;  Surgeon: [name], MD;  Location: [place] CV LAB;  Service: Cardiovascular;  Laterality: N/A;   LUMBAR PUNCTURE     as child   NO PAST SURGERIES     TEE WITHOUT CARDIOVERSION N/A 01/07/2018   Procedure: TRANSESOPHAGEAL ECHOCARDIOGRAM (TEE);  Surgeon: Minna Merritts, MD;  Location: [facility] ORS;  Service: Cardiovascular;  Laterality: N/A;   Patient Active Problem List   Diagnosis Date Noted   Hydronephrosis, left 05/04/2022   Type II diabetes mellitus with renal manifestations ([place]) 05/04/2022    Leukocytosis 05/04/2022   Pre-op evaluation 04/24/2022   Headache 07/07/2021   Hearing loss 07/07/2021   Open wound 01/25/2021   History of colon polyps 10/15/2020   Postoperative hemorrhage involving digestive system following digestive system procedure 09/19/2020   Acute cholecystitis without calculus 09/09/2020   Type 2 diabetes mellitus, with long-term current use of insulin ([place]) 09/09/2020   Cryptogenic stroke ([place]) 07/13/2020   History of loop recorder 07/13/2020   Weakness 06/18/2020   History of 2019 novel coronavirus disease (COVID-19) 05/20/2020   ESRD (end stage renal disease) ([place]) 04/04/2020   Pneumonia due to COVID-19 virus 04/03/2020   AKI (acute kidney injury) ([place]) 04/03/2020   Elevated troponin 04/03/2020   Acquired trigger finger 06/08/2019   Lymphedema 06/08/2019   Anemia 04/17/2019   Swelling of left lower extremity 01/10/2019   Facial droop 04/09/2018   Daytime somnolence 03/30/2018   Carotid artery disease ([place]) 02/03/2018   Intracranial vascular stenosis 09/12/2017   Cough 01/20/2017   Bell's palsy 11/10/2016   History of CVA (cerebrovascular accident) 11/10/2016   Benign localized hyperplasia of prostate with urinary obstruction 10/27/2016   History of nephrolithiasis 10/27/2016   TIA (transient ischemic attack) 10/20/2016   Near syncope 06/23/2016   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   Neuropathy 08/06/2015   Health care maintenance 08/06/2015  Essential hypertension 08/06/2015   Heme positive stool 10/10/2013   Hypothyroidism 10/10/2013   Microalbuminuria 10/10/2013   Hyperlipidemia 10/10/2013   B12 deficiency 10/10/2013   Diabetes ([place]) 07/04/2013   Environmental allergies 07/04/2013   ONSET DATE: 2-3 years  REFERRING DIAG: Neuropathy  THERAPY DIAG:  Difficulty in walking, not elsewhere classified  Muscle weakness (generalized)  Unsteadiness on feet  Abnormality of gait and mobility  Rationale for Evaluation and Treatment  Rehabilitation  SUBJECTIVE:                                                                                                                                                                                             SUBJECTIVE STATEMENT: Patient reports no falls or LOB since last session. Went to dialysis yesterday.    Pt accompanied by: self  PERTINENT HISTORY: Patient presents to physical therapy for neuropathy. He underwent banding of his L UE fistula on 05/13/22 and scheduled for additional banding on 06/07/22. 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 Patient reports his balance is very unsteady due to neuropathy in feet, feels weak in LE's.   PAIN:  Are you having pain? Yes: NPRS scale: 0/10 Pain location: L foot Pain description: sharp  Aggravating factors: worse at night Relieving factors: lyrica   PRECAUTIONS: Fall  WEIGHT BEARING RESTRICTIONS Yes no lifting >5 lb in arm   FALLS: Has patient fallen in last 6 months? No  LIVING ENVIRONMENT: Lives with: lives alone Lives in: House/apartment Stairs: Yes: Internal: flight steps; on right going up Has following equipment at home: Single point cane and Walker - 2 wheeled  PLOF: Independent  PATIENT GOALS to be more steady and walk as normally as possible.   OBJECTIVE:   LOWER EXTREMITY MMT:    MMT Right Eval Left Eval  Hip flexion 4- 3+  Hip extension    Hip abduction 4- 4+  Hip adduction 4- 3+  Knee flexion 4 4-  Knee extension 4 4  Ankle dorsiflexion 2+ 2+  Ankle plantarflexion 3+ 3+  (Blank rows = not tested)  FUNCTIONAL TESTs:  5 times sit to stand: 19.6 seconds with walking stick; one LOB 10 meter walk test: 12 seconds Berg Balance Scale: 33/56  PATIENT SURVEYS:  ABC scale 67% FOTO 53%  TODAY'S TREATMENT:      TREATMENT:    Neuro re-ed   In // bar: -airex balance beam: lateral stepping 6x length of //bars; finger tip support  -airex balance beam: tandem walking 6x  length of // bars  -airex balance beam: 5lb bar chest press 10x, overhead press 10x   Incline static stand 60 seconds Bosu ball: lateral modified lunge 10x each side Bosu ball; modified forward lunge 10x each side   TherEx:  Standing march 15x each LE into RTB across bars    Seated  Lateral step over hedgehog 15x each LE  Adduction ball squeeze 15x  10x STS; x 2 sets   PATIENT EDUCATION: Education details: HEP, goals, POC Person educated: Patient Education method: Explanation, Demonstration, Tactile cues, Verbal cues, and Handouts Education comprehension: verbalized understanding, returned demonstration, verbal cues required, and tactile cues required  HOME EXERCISE PROGRAM: Access Code: [email or web address] URL: [email or web address] Date: 06/03/2022 Prepared by: Janna Arch  Exercises - Seated Heel Toe Raises  - 1 x daily - 7 x weekly - 2 sets - 10 reps - 5 hold - Standing Tandem Balance with Counter Support  - 1 x daily - 7 x weekly - 2 sets - 2 reps - 30 hold - Standing March with Counter Support  - 1 x daily - 7 x weekly - 2 sets - 10 reps - 5 hold   GOALS: Goals reviewed with patient? Yes   SHORT TERM GOALS: Target date: 07/01/2022  Patient will be independent in home exercise program to improve strength/mobility for better functional independence with ADLs. Baseline:10/9; HEP given  Goal status: INITIAL   LONG TERM GOALS: Target date: 11/13/2022  Patient will increase FOTO score to equal to or greater than   63%  to demonstrate statistically significant improvement in mobility and quality of life.  Baseline: 10/9: 53% 11/29: 58% 12/27: 51% 1/17: 56%  Goal status: IN PROGRESS  2.  Patient (< 22 years old) will complete five times sit to stand test in < 10 seconds without UE support indicating  an increased LE strength and improved balance. Baseline: 10/9: 19.6 seconds with walking stick; one LOB 11/29: 20.56 seconds 12/27: 13.4 seconds hands on knees 1/17: 13 seconds hands on knees  Goal status: IN PROGRESS  3.  Patient will increase Berg Balance score by > 45/56 to demonstrate decreased fall risk during functional activities. Baseline: 10/9: 33/56 11/29: 43/56 12/27: 44/56 1/17:  44/56 Goal status: IN PROGRESS  4.  Patient will increase 10 meter walk test to >1.29ms as to improve gait speed for better community ambulation and to reduce fall risk. Baseline: 11/29: 1.19 m/s Goal status: MET  5.  Patient will increase ABC scale score >80% to demonstrate better functional mobility and better confidence with ADLs.  Baseline: 10/9: 67% 11/29: 75.6% 12/27: 78%  Goal status: IN PROGRESS  6. Patient will tolerate 5 seconds of single leg stance without loss of balance to improve ability to get in and out of shower safely. Baseline: 11/29: unable to stand on single leg safely 12/27: 2 seconds 1/17: 3 seconds Goal status: IN PROGRESS   6. Patient will increase six minute walk test distance to >1000 for progression to community ambulator and improve gait ability Baseline: 12/27: 845 ft with with walking stick 1/17: [id number] status: partially met    ASSESSMENT:  CLINICAL IMPRESSION:  Patient tolerates stability interventions on unstable surfaces. His ankle righting reactions are improving with decreased instability however finger tip support is still required intermittently throughout session. Patient is highly motivated throughout session. Pt will continue to benefit from skilled therapy to address remaining deficits in order to improve overall QoL and return to PLOF.     OBJECTIVE IMPAIRMENTS  Abnormal gait, decreased activity tolerance, decreased balance, decreased coordination, decreased endurance, decreased mobility, difficulty walking, decreased strength, impaired flexibility,  impaired sensation, improper body mechanics, and pain.   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   REHAB POTENTIAL: Good  CLINICAL DECISION MAKING: Evolving/moderate complexity  EVALUATION COMPLEXITY: Moderate  PLAN: PT FREQUENCY: 2x/week  PT DURATION: 12 weeks  PLANNED INTERVENTIONS: Therapeutic exercises, Therapeutic activity, Neuromuscular re-education, Balance training, Gait training, Patient/Family education, Self Care, Joint mobilization, Stair training, Vestibular training, Canalith repositioning, Visual/preceptual remediation/compensation, DME instructions, Dry Needling, Cognitive remediation, Spinal mobilization, Cryotherapy, Moist heat, Manual lymph drainage, Compression bandaging, Taping, Vasopneumatic device, Ultrasound, Manual therapy, and Re-evaluation  PLAN FOR NEXT SESSION: Continue to progress dynamic balance and dual tasking balance. Work on single leg balance progressions.     Janna Arch PT  Physical Therapist- [facility]  10/02/22, 10:59 AM

## 2022-10-02 ENCOUNTER — Ambulatory Visit: Payer: Medicare Other

## 2022-10-02 DIAGNOSIS — R262 Difficulty in walking, not elsewhere classified: Secondary | ICD-10-CM | POA: Diagnosis not present

## 2022-10-02 DIAGNOSIS — R2681 Unsteadiness on feet: Secondary | ICD-10-CM

## 2022-10-02 DIAGNOSIS — R269 Unspecified abnormalities of gait and mobility: Secondary | ICD-10-CM

## 2022-10-02 DIAGNOSIS — M6281 Muscle weakness (generalized): Secondary | ICD-10-CM

## 2022-10-03 NOTE — Therapy (Signed)
OUTPATIENT PHYSICAL THERAPY NEURO TREATMENT   Patient Name: Gregory Crane MRN: [id number] DOB:08/09/1971, 52 y.o., male Today's Date: 10/07/2022  PCP: [name] MD REFERRING PROVIDER: [name] MD  PT End of Session - 10/07/22 1013     Visit Number 27    Number of Visits 40    Date for PT Re-Evaluation 11/13/22    Authorization Type Medicare A&B; Medicaid; 09/11/22    Authorization Time Period 06/24/22-12/121/23 for 12 visits    Authorization - Number of Visits 12    Progress Note Due on Visit 10    PT Start Time 1015    PT Stop Time 1059    PT Time Calculation (min) 44 min    Equipment Utilized During Treatment Gait belt    Activity Tolerance Patient tolerated treatment well;No increased pain;Patient limited by fatigue    Behavior During Therapy [facility] for tasks assessed/performed                         Past Medical History:  Diagnosis Date   Allergy    Anemia    Bell's palsy    Diabetes mellitus without complication ([place])    diet controlled   Hypertension    Hypothyroidism    Kidney stones    Pseudotumor cerebri    Stroke [facility])    Past Surgical History:  Procedure Laterality Date   COLONOSCOPY WITH PROPOFOL N/A 03/30/2020   Procedure: COLONOSCOPY WITH PROPOFOL;  Surgeon: Lesly Rubenstein, MD;  Location: [facility] ENDOSCOPY;  Service: Endoscopy;  Laterality: N/A;   LOOP RECORDER INSERTION N/A 01/27/2018   Procedure: LOOP RECORDER INSERTION;  Surgeon: [name], MD;  Location: [place] CV LAB;  Service: Cardiovascular;  Laterality: N/A;   LUMBAR PUNCTURE     as child   NO PAST SURGERIES     TEE WITHOUT CARDIOVERSION N/A 01/07/2018   Procedure: TRANSESOPHAGEAL ECHOCARDIOGRAM (TEE);  Surgeon: Minna Merritts, MD;  Location: [facility] ORS;  Service: Cardiovascular;  Laterality: N/A;   Patient Active Problem List   Diagnosis Date Noted   Unsteady gait 10/05/2022   Hydronephrosis, left 05/04/2022   Type II diabetes mellitus with renal  manifestations ([place]) 05/04/2022   Leukocytosis 05/04/2022   Pre-op evaluation 04/24/2022   Headache 07/07/2021   Hearing loss 07/07/2021   Open wound 01/25/2021   History of colon polyps 10/15/2020   Postoperative hemorrhage involving digestive system following digestive system procedure 09/19/2020   Acute cholecystitis without calculus 09/09/2020   Type 2 diabetes mellitus, with long-term current use of insulin ([place]) 09/09/2020   Cryptogenic stroke ([place]) 07/13/2020   History of loop recorder 07/13/2020   Weakness 06/18/2020   History of 2019 novel coronavirus disease (COVID-19) 05/20/2020   ESRD (end stage renal disease) ([place]) 04/04/2020   Pneumonia due to COVID-19 virus 04/03/2020   AKI (acute kidney injury) ([place]) 04/03/2020   Elevated troponin 04/03/2020   Acquired trigger finger 06/08/2019   Lymphedema 06/08/2019   Anemia 04/17/2019   Swelling of left lower extremity 01/10/2019   Facial droop 04/09/2018   Daytime somnolence 03/30/2018   Carotid artery disease ([place]) 02/03/2018   Intracranial vascular stenosis 09/12/2017   Cough 01/20/2017   Bell's palsy 11/10/2016   History of CVA (cerebrovascular accident) 11/10/2016   Benign localized hyperplasia of prostate with urinary obstruction 10/27/2016   History of nephrolithiasis 10/27/2016   TIA (transient ischemic attack) 10/20/2016   Near syncope 06/23/2016   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   Neuropathy 08/06/2015  Health care maintenance 08/06/2015   Essential hypertension 08/06/2015   Heme positive stool 10/10/2013   Hypothyroidism 10/10/2013   Microalbuminuria 10/10/2013   Hyperlipidemia 10/10/2013   B12 deficiency 10/10/2013   Diabetes ([place]) 07/04/2013   Environmental allergies 07/04/2013   ONSET DATE: 2-3 years  REFERRING DIAG: Neuropathy  THERAPY DIAG:  Difficulty in walking, not elsewhere classified  Muscle weakness (generalized)  Unsteadiness on feet  Rationale for Evaluation and Treatment  Rehabilitation  SUBJECTIVE:                                                                                                                                                                                             SUBJECTIVE STATEMENT: Patient reports having a good weekend. No falls or LOB since last session.    Pt accompanied by: self  PERTINENT HISTORY: Patient presents to physical therapy for neuropathy. He underwent banding of his L UE fistula on 05/13/22 and scheduled for additional banding on 06/07/22. 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 Patient reports his balance is very unsteady due to neuropathy in feet, feels weak in LE's.   PAIN:  Are you having pain? Yes: NPRS scale: 0/10 Pain location: L foot Pain description: sharp  Aggravating factors: worse at night Relieving factors: lyrica   PRECAUTIONS: Fall  WEIGHT BEARING RESTRICTIONS Yes no lifting >5 lb in arm   FALLS: Has patient fallen in last 6 months? No  LIVING ENVIRONMENT: Lives with: lives alone Lives in: House/apartment Stairs: Yes: Internal: flight steps; on right going up Has following equipment at home: Single point cane and Walker - 2 wheeled  PLOF: Independent  PATIENT GOALS to be more steady and walk as normally as possible.   OBJECTIVE:   LOWER EXTREMITY MMT:    MMT Right Eval Left Eval  Hip flexion 4- 3+  Hip extension    Hip abduction 4- 4+  Hip adduction 4- 3+  Knee flexion 4 4-  Knee extension 4 4  Ankle dorsiflexion 2+ 2+  Ankle plantarflexion 3+ 3+  (Blank rows = not tested)  FUNCTIONAL TESTs:  5 times sit to stand: 19.6 seconds with walking stick; one LOB 10 meter walk test: 12 seconds Berg Balance Scale: 33/56  PATIENT SURVEYS:  ABC scale 67% FOTO 53%  TODAY'S TREATMENT:      TREATMENT:    Neuro re-ed   In // bar: -airex balance beam: lateral stepping 8x length of //bars; finger tip support  -airex balance beam: tandem walking 8x  length of // bars  -airex balance beam: 5lb bar chest press 10x, overhead press 10x   -lateral step over orange hurdle 10x each side -forward orange hurdle step over 10x each side   TherEx:  Standing march 15x each LE into RTB across bars   Seated  Green soft putty: squeeze 10x each UE, finger tip squeee 10x 10x STS; x 2 sets   PATIENT EDUCATION: Education details: HEP, goals, POC Person educated: Patient Education method: Explanation, Demonstration, Tactile cues, Verbal cues, and Handouts Education comprehension: verbalized understanding, returned demonstration, verbal cues required, and tactile cues required  HOME EXERCISE PROGRAM: Access Code: [email or web address] URL: [email or web address] Date: 06/03/2022 Prepared by: Janna Arch  Exercises - Seated Heel Toe Raises  - 1 x daily - 7 x weekly - 2 sets - 10 reps - 5 hold - Standing Tandem Balance with Counter Support  - 1 x daily - 7 x weekly - 2 sets - 2 reps - 30 hold - Standing March with Counter Support  - 1 x daily - 7 x weekly - 2 sets - 10 reps - 5 hold   GOALS: Goals reviewed with patient? Yes   SHORT TERM GOALS: Target date: 07/01/2022  Patient will be independent in home exercise program to improve strength/mobility for better functional independence with ADLs. Baseline:10/9; HEP given  Goal status: INITIAL   LONG TERM GOALS: Target date: 11/13/2022  Patient will increase FOTO score to equal to or greater than   63%  to demonstrate statistically significant improvement in mobility and quality of life.  Baseline: 10/9: 53% 11/29: 58% 12/27: 51% 1/17: 56%  Goal status: IN PROGRESS  2.  Patient (< 35 years old) will complete five times sit to stand test in < 10 seconds without UE support indicating an increased LE strength and improved  balance. Baseline: 10/9: 19.6 seconds with walking stick; one LOB 11/29: 20.56 seconds 12/27: 13.4 seconds hands on knees 1/17: 13 seconds hands on knees  Goal status: IN PROGRESS  3.  Patient will increase Berg Balance score by > 45/56 to demonstrate decreased fall risk during functional activities. Baseline: 10/9: 33/56 11/29: 43/56 12/27: 44/56 1/17:  44/56 Goal status: IN PROGRESS  4.  Patient will increase 10 meter walk test to >1.51ms as to improve gait speed for better community ambulation and to reduce fall risk. Baseline: 11/29: 1.19 m/s Goal status: MET  5.  Patient will increase ABC scale score >80% to demonstrate better functional mobility and better confidence with ADLs.  Baseline: 10/9: 67% 11/29: 75.6% 12/27: 78%  Goal status: IN PROGRESS  6. Patient will tolerate 5 seconds of single leg stance without loss of balance to improve ability to get in and out of shower safely. Baseline: 11/29: unable to stand on single leg safely 12/27: 2 seconds 1/17: 3 seconds Goal status: IN PROGRESS   6. Patient will increase six minute walk test distance to >1000 for progression to community ambulator and improve gait ability Baseline: 12/27: 845 ft with with walking stick 1/17: [id number] status: partially met    ASSESSMENT:  CLINICAL IMPRESSION:  Patient will benefit from referral to OT for dexterity and fine motor skills of hands. Pt will continue to benefit from skilled therapy to address remaining deficits in order to improve overall QoL and return to PLOF.     OBJECTIVE IMPAIRMENTS Abnormal gait, decreased activity tolerance, decreased balance, decreased coordination, decreased endurance, decreased mobility, difficulty walking, decreased strength, impaired flexibility, impaired sensation, improper body mechanics, and  pain.   ACTIVITY LIMITATIONS carrying, lifting, bending, standing, squatting, sleeping, stairs, transfers, bed mobility, bathing, toileting, dressing, locomotion  level, and caring for others  PARTICIPATION LIMITATIONS: meal prep, cleaning, laundry, medication management, personal finances, interpersonal relationship, driving, shopping, community activity, and yard work  PERSONAL FACTORS Age, Fitness, Past/current experiences, Time since onset of injury/illness/exacerbation, Transportation, and 3+ comorbidities: CAD, cryptogenic stroke (2021), deafness in R ear, diabetic retinopathy, dialysis, ESRD, COVID, HTN, hypothyroidism, leukocytosis, nephrolithiasis, neuropathy, Pseudotumor cerebri 1986, sepsis, DM type I, DM type II  are also affecting patient's functional outcome.   REHAB POTENTIAL: Good  CLINICAL DECISION MAKING: Evolving/moderate complexity  EVALUATION COMPLEXITY: Moderate  PLAN: PT FREQUENCY: 2x/week  PT DURATION: 12 weeks  PLANNED INTERVENTIONS: Therapeutic exercises, Therapeutic activity, Neuromuscular re-education, Balance training, Gait training, Patient/Family education, Self Care, Joint mobilization, Stair training, Vestibular training, Canalith repositioning, Visual/preceptual remediation/compensation, DME instructions, Dry Needling, Cognitive remediation, Spinal mobilization, Cryotherapy, Moist heat, Manual lymph drainage, Compression bandaging, Taping, Vasopneumatic device, Ultrasound, Manual therapy, and Re-evaluation  PLAN FOR NEXT SESSION: Continue to progress dynamic balance and dual tasking balance. Work on single leg balance progressions.     Janna Arch PT  Physical Therapist- [facility]  10/07/22, 11:13 AM

## 2022-10-04 ENCOUNTER — Encounter: Payer: Self-pay | Admitting: Internal Medicine

## 2022-10-04 ENCOUNTER — Telehealth: Payer: Self-pay

## 2022-10-04 ENCOUNTER — Ambulatory Visit (INDEPENDENT_AMBULATORY_CARE_PROVIDER_SITE_OTHER): Payer: Medicare Other | Admitting: Internal Medicine

## 2022-10-04 VITALS — BP 136/72 | HR 65 | Temp 97.9°F | Resp 16 | Ht 72.0 in | Wt 241.0 lb

## 2022-10-04 DIAGNOSIS — E11319 Type 2 diabetes mellitus with unspecified diabetic retinopathy without macular edema: Secondary | ICD-10-CM | POA: Diagnosis not present

## 2022-10-04 DIAGNOSIS — N183 Chronic kidney disease, stage 3 unspecified: Secondary | ICD-10-CM

## 2022-10-04 DIAGNOSIS — Z125 Encounter for screening for malignant neoplasm of prostate: Secondary | ICD-10-CM | POA: Diagnosis not present

## 2022-10-04 DIAGNOSIS — N186 End stage renal disease: Secondary | ICD-10-CM

## 2022-10-04 DIAGNOSIS — Z8673 Personal history of transient ischemic attack (TIA), and cerebral infarction without residual deficits: Secondary | ICD-10-CM

## 2022-10-04 DIAGNOSIS — E039 Hypothyroidism, unspecified: Secondary | ICD-10-CM

## 2022-10-04 DIAGNOSIS — Z794 Long term (current) use of insulin: Secondary | ICD-10-CM | POA: Diagnosis not present

## 2022-10-04 DIAGNOSIS — E1122 Type 2 diabetes mellitus with diabetic chronic kidney disease: Secondary | ICD-10-CM

## 2022-10-04 DIAGNOSIS — R531 Weakness: Secondary | ICD-10-CM

## 2022-10-04 DIAGNOSIS — E785 Hyperlipidemia, unspecified: Secondary | ICD-10-CM

## 2022-10-04 DIAGNOSIS — D649 Anemia, unspecified: Secondary | ICD-10-CM

## 2022-10-04 DIAGNOSIS — R2681 Unsteadiness on feet: Secondary | ICD-10-CM

## 2022-10-04 DIAGNOSIS — Z0000 Encounter for general adult medical examination without abnormal findings: Secondary | ICD-10-CM

## 2022-10-04 DIAGNOSIS — G629 Polyneuropathy, unspecified: Secondary | ICD-10-CM

## 2022-10-04 DIAGNOSIS — I10 Essential (primary) hypertension: Secondary | ICD-10-CM

## 2022-10-04 DIAGNOSIS — N1330 Unspecified hydronephrosis: Secondary | ICD-10-CM

## 2022-10-04 DIAGNOSIS — I779 Disorder of arteries and arterioles, unspecified: Secondary | ICD-10-CM

## 2022-10-04 LAB — LIPID PANEL
Cholesterol: 172 mg/dL (ref 0–200)
HDL: 36.2 mg/dL — ABNORMAL LOW (ref >39.00)
LDL Cholesterol: 108 mg/dL — ABNORMAL HIGH (ref 0–99)
NonHDL: 135.53
Total CHOL/HDL Ratio: 5
Triglycerides: 140 mg/dL (ref 0.0–149.0)
VLDL: 28 mg/dL (ref 0.0–40.0)

## 2022-10-04 LAB — CBC WITH DIFFERENTIAL/PLATELET
Basophils Absolute: 0 10*3/uL (ref 0.0–0.1)
Basophils Relative: 0.7 % (ref 0.0–3.0)
Eosinophils Absolute: 0.1 10*3/uL (ref 0.0–0.7)
Eosinophils Relative: 2.4 % (ref 0.0–5.0)
HCT: 39.6 % (ref 39.0–52.0)
Hemoglobin: 13.4 g/dL (ref 13.0–17.0)
Lymphocytes Relative: 21.8 % (ref 12.0–46.0)
Lymphs Abs: 1.3 10*3/uL (ref 0.7–4.0)
MCHC: 33.9 g/dL (ref 30.0–36.0)
MCV: 89.1 fl (ref 78.0–100.0)
Monocytes Absolute: 0.5 10*3/uL (ref 0.1–1.0)
Monocytes Relative: 8.7 % (ref 3.0–12.0)
Neutro Abs: 3.9 10*3/uL (ref 1.4–7.7)
Neutrophils Relative %: 66.4 % (ref 43.0–77.0)
Platelets: 245 10*3/uL (ref 150.0–400.0)
RBC: 4.44 Mil/uL (ref 4.22–5.81)
RDW: 14.2 % (ref 11.5–15.5)
WBC: 5.9 10*3/uL (ref 4.0–10.5)

## 2022-10-04 LAB — COMPREHENSIVE METABOLIC PANEL
ALT: 7 U/L (ref 0–53)
AST: 10 U/L (ref 0–37)
Albumin: 4.5 g/dL (ref 3.5–5.2)
Alkaline Phosphatase: 88 U/L (ref 39–117)
BUN: 44 mg/dL — ABNORMAL HIGH (ref 6–23)
CO2: 28 mEq/L (ref 19–32)
Calcium: 9.2 mg/dL (ref 8.4–10.5)
Chloride: 99 mEq/L (ref 96–112)
Creatinine, Ser: 4.47 mg/dL — ABNORMAL HIGH (ref 0.40–1.50)
GFR: 14.38 mL/min — CL (ref >60.00)
Glucose, Bld: 85 mg/dL (ref 70–99)
Potassium: 4 mEq/L (ref 3.5–5.1)
Sodium: 141 mEq/L (ref 135–145)
Total Bilirubin: 0.4 mg/dL (ref 0.2–1.2)
Total Protein: 7.6 g/dL (ref 6.0–8.3)

## 2022-10-04 LAB — TSH: TSH: 10.5 u[IU]/mL — ABNORMAL HIGH (ref 0.35–5.50)

## 2022-10-04 LAB — HEMOGLOBIN A1C: Hgb A1c MFr Bld: 6.8 % — ABNORMAL HIGH (ref 4.6–6.5)

## 2022-10-04 LAB — PSA, MEDICARE: PSA: 0.48 ng/ml (ref 0.10–4.00)

## 2022-10-04 NOTE — Progress Notes (Unsigned)
Subjective:    Patient ID: Gregory Crane, male    DOB: 1970-11-03, 52 y.o.   MRN: [id number]  Patient here for  Chief Complaint  Patient presents with   Annual Exam    HPI With past history of diabetes, hypertension, ESRD and hypercholesterolemia.  He comes in today to follow up on these issues as well as for a complete physical exam.  Tolerating dialysis. Had some questions regarding dialysis and his kidney function.  Request labs here today.  Overall doing better. More energy overall.  Working with PT to get stronger.  PT felt would benefit from orthotics.  Will discuss with podiatry.  Has problems with his foot turning and PT felt would benefit.  No chest pain reported.  Breathing stable.  No increased cough or congestion.  Started using left arm fistual this week.  Left hand - still limited.  No abdominal pain reported.  Followed by endocrinology.      Past Medical History:  Diagnosis Date   Allergy    Anemia    Bell's palsy    Diabetes mellitus without complication ([place])    diet controlled   Hypertension    Hypothyroidism    Kidney stones    Pseudotumor cerebri    Stroke [facility])    Past Surgical History:  Procedure Laterality Date   COLONOSCOPY WITH PROPOFOL N/A 03/30/2020   Procedure: COLONOSCOPY WITH PROPOFOL;  Surgeon: Lesly Rubenstein, MD;  Location: [facility] ENDOSCOPY;  Service: Endoscopy;  Laterality: N/A;   LOOP RECORDER INSERTION N/A 01/27/2018   Procedure: LOOP RECORDER INSERTION;  Surgeon: [name], MD;  Location: [place] CV LAB;  Service: Cardiovascular;  Laterality: N/A;   LUMBAR PUNCTURE     as child   NO PAST SURGERIES     TEE WITHOUT CARDIOVERSION N/A 01/07/2018   Procedure: TRANSESOPHAGEAL ECHOCARDIOGRAM (TEE);  Surgeon: Minna Merritts, MD;  Location: [facility] ORS;  Service: Cardiovascular;  Laterality: N/A;   Family History  Problem Relation Age of Onset   Breast cancer Mother    Diabetes Father    Diabetes Sister    Arthritis Maternal  Grandmother    Diabetes Maternal Grandmother    Social History   Socioeconomic History   Marital status: Widowed    Spouse name: [name]   Number of children: 2   Years of education: Not on file   Highest education level: Not on file  Occupational History   Not on file  Tobacco Use   Smoking status: Never   Smokeless tobacco: Never  Vaping Use   Vaping Use: Never used  Substance and Sexual Activity   Alcohol use: No    Alcohol/week: 0.0 standard drinks of alcohol    Comment: very rarely (1-2/year)   Drug use: No   Sexual activity: Yes  Other Topics Concern   Not on file  Social History Narrative   Lives at home with wife, independent at baseline.   Social Determinants of Health   Financial Resource Strain: High Risk (06/26/2021)   Overall Financial Resource Strain (CARDIA)    Difficulty of Paying Living Expenses: Hard  Food Insecurity: No Food Insecurity (05/05/2022)   Hunger Vital Sign    Worried About Running Out of Food in the Last Year: Never true    Ran Out of Food in the Last Year: Never true  Transportation Needs: No Transportation Needs (05/05/2022)   PRAPARE - Hydrologist (Medical): No    Lack of Transportation (  Non-Medical): No  Physical Activity: Inactive (09/13/2017)   Exercise Vital Sign    Days of Exercise per Week: 0 days    Minutes of Exercise per Session: 0 min  Stress: Stress Concern Present (09/13/2017)   [place]    Feeling of Stress : To some extent  Social Connections: Moderately Integrated (09/13/2017)   Social Connection and Isolation Panel [NHANES]    Frequency of Communication with Friends and Family: More than three times a week    Frequency of Social Gatherings with Friends and Family: More than three times a week    Attends Religious Services: More than 4 times per year    Active Member of [employer] or Organizations: No    Attends Theatre manager Meetings: Never    Marital Status: Married     Review of Systems  Constitutional:  Negative for appetite change and unexpected weight change.  HENT:  Negative for congestion.   Respiratory:  Negative for cough, chest tightness and shortness of breath.   Cardiovascular:  Negative for chest pain and palpitations.       No increased swelling.  Legs - much improved.   Gastrointestinal:  Negative for diarrhea, nausea and vomiting.  Genitourinary:  Negative for difficulty urinating and dysuria.       Reports urinates a lot.   Musculoskeletal:  Negative for joint swelling and myalgias.  Skin:  Negative for color change and rash.  Neurological:  Negative for dizziness and headaches.  Psychiatric/Behavioral:  Negative for agitation and dysphoric mood.        Objective:     BP 136/72   Pulse 65   Temp 97.9 F (36.6 C)   Resp 16   Ht 6' (1.829 m)   Wt 241 lb (109.3 kg)   SpO2 98%   BMI 32.69 kg/m  Wt Readings from Last 3 Encounters:  10/04/22 241 lb (109.3 kg)  08/22/22 250 lb (113.4 kg)  06/24/22 246 lb (111.6 kg)    Physical Exam Constitutional:      General: He is not in acute distress.    Appearance: Normal appearance. He is well-developed.  HENT:     Head: Normocephalic and atraumatic.     Right Ear: External ear normal.     Left Ear: External ear normal.  Eyes:     General: No scleral icterus.       Right eye: No discharge.        Left eye: No discharge.     Conjunctiva/sclera: Conjunctivae normal.  Neck:     Thyroid: No thyromegaly.  Cardiovascular:     Rate and Rhythm: Normal rate and regular rhythm.  Pulmonary:     Effort: No respiratory distress.     Breath sounds: Normal breath sounds. No wheezing.  Abdominal:     General: Bowel sounds are normal.     Palpations: Abdomen is soft.     Tenderness: There is no abdominal tenderness.  Musculoskeletal:        General: No swelling or tenderness.     Cervical back: Neck supple. No tenderness.   Lymphadenopathy:     Cervical: No cervical adenopathy.  Skin:    Findings: No erythema or rash.  Neurological:     Mental Status: He is alert and oriented to person, place, and time.  Psychiatric:        Mood and Affect: Mood normal.        Behavior: Behavior normal.  Outpatient Encounter Medications as of 10/04/2022  Medication Sig   Accu-Chek FastClix Lancets MISC USE TO CHECK BLOOD SUGAR THREE TIMES DAILY AS DIRECTED   amLODipine (NORVASC) 10 MG tablet TAKE 1 TABLET(10 MG) BY MOUTH DAILY   ammonium lactate (AMLACTIN) 12 % cream Apply 1 Application topically as needed for dry skin.   aspirin (ASPIRIN LOW DOSE) 81 MG EC tablet TAKE 1 TABLET(81 MG) BY MOUTH DAILY   glucose blood (FREESTYLE LITE) test strip Check blood sugars twice a day (Dx. 250.02)   hydrALAZINE (APRESOLINE) 50 MG tablet TAKE 1 TABLET(50 MG) BY MOUTH THREE TIMES DAILY   insulin glargine (LANTUS) 100 UNIT/ML injection Inject 0.13 mLs (13 Units total) into the skin once daily. (Patient taking differently: Inject 10-15 Units into the skin daily.)   insulin lispro (HUMALOG) 100 UNIT/ML injection Inject 4-12 units three times daily before meals per sliding scale: If premeal sugar is <130, inject 4 units; if 131-180, inject 6 units; if 181-240, inject 8 units; if 241-300, inject 10 units, if  >300, inject 12 units (max daily dose 36 units)   Insulin Syringe-Needle U-100 (INSULIN SYRINGE .5CC/30GX5/16") 30G X 5/16" 0.5 ML MISC USE AS DIRECTED WITH INSULIN   levothyroxine (SYNTHROID) 175 MCG tablet TAKE 1 TABLET (175 MCG) BY MOUTH ONCE DAILY BEFORE BREAKFAST.   pregabalin (LYRICA) 100 MG capsule TAKE 1 CAPSULE(100 MG) BY MOUTH THREE TIMES DAILY (Patient taking differently: Take 100 mg by mouth daily.)   RENVELA 800 MG tablet Take 800 mg by mouth 3 (three) times daily.   Syringe/Needle, Disp, (SYRINGE 3CC/25GX1") 25G X 1" 3 ML MISC Use as directed to administer b12 injections.   No facility-administered encounter medications  on file as of 10/04/2022.     Lab Results  Component Value Date   WBC 5.9 10/04/2022   HGB 13.4 10/04/2022   HCT 39.6 10/04/2022   PLT 245.0 10/04/2022   GLUCOSE 85 10/04/2022   CHOL 172 10/04/2022   TRIG 140.0 10/04/2022   HDL 36.20 (L) 10/04/2022   LDLDIRECT 107.0 06/26/2021   LDLCALC 108 (H) 10/04/2022   ALT 7 10/04/2022   AST 10 10/04/2022   NA 141 10/04/2022   K 4.0 10/04/2022   CL 99 10/04/2022   CREATININE 4.47 (H) 10/04/2022   BUN 44 (H) 10/04/2022   CO2 28 10/04/2022   TSH 10.50 (H) 10/04/2022   PSA 0.48 10/04/2022   INR 1.0 05/04/2022   HGBA1C 6.8 (H) 10/04/2022    CT Renal Stone Study  Result Date: 08/22/2022 CLINICAL DATA:  Left-sided lower back pain EXAM: CT ABDOMEN AND PELVIS WITHOUT CONTRAST TECHNIQUE: Multidetector CT imaging of the abdomen and pelvis was performed following the standard protocol without IV contrast. RADIATION DOSE REDUCTION: This exam was performed according to the departmental dose-optimization program which includes automated exposure control, adjustment of the mA and/or kV according to patient size and/or use of iterative reconstruction technique. COMPARISON:  CT 05/04/2022 FINDINGS: Lower chest: Bronchial wall thickening and mucous plugging in the left greater than right lower lobes with centrilobular micro nodularity. Trace pericardial effusion. Hepatobiliary: Cholecystectomy.  No focal liver abnormality. Pancreas: Fatty atrophy.  No acute inflammatory change. Spleen: Enlarged. Adrenals/Urinary Tract: Normal adrenal glands. Nonspecific perinephric stranding bilaterally, greater on the left. Marked left hydroureteronephrosis not significantly changed from 05/04/2022. Tiny 1-2 mm stone at the left UVJ (2/88). Unremarkable bladder. Stomach/Bowel: Unremarkable stomach. Normal caliber large and small bowel. Normal appendix. Vascular/Lymphatic: Minimal aorto bi-iliac atherosclerotic calcification. Lymphadenopathy. Reproductive: Unremarkable prostate.  Other: Trace free  fluid in the pelvis.  No free intraperitoneal air. Musculoskeletal: No acute abnormality. IMPRESSION: Tiny 1-2 mm stone at the left UVJ. Marked left hydroureteronephrosis similar to 05/04/2022. Nonspecific left-greater-than-right perinephric stranding is similar to 05/04/2022. Similar splenomegaly. Small airway infection/inflammation in the lower lobes. Electronically Signed   By: Placido Sou M.D.   On: 08/22/2022 01:29       Assessment & Plan:  Health care maintenance Assessment & Plan: Physical today 10/04/22. .  Colonoscopy overdue.  Needs psa checked.     Hyperlipidemia, unspecified hyperlipidemia type Assessment & Plan: Low cholesterol diet and exercise.  Follow lipid panel and liver function tests.   Orders: -     Lipid panel -     Comprehensive metabolic panel  Hypothyroidism, unspecified type Assessment & Plan: On thyroid replacement.  Follow tsh. Instructed on importance of taking regularly.    Orders: -     TSH  Prostate cancer screening -     PSA, Medicare  ESRD (end stage renal disease) ([place]) Assessment & Plan: Sees nephrology.  On dialysis.  Tolerating. Request labs today as outlined.   Orders: -     CBC with Differential/Platelet -     Comprehensive metabolic panel  Type 2 diabetes mellitus with retinopathy, with long-term current use of insulin, macular edema presence unspecified, unspecified laterality, unspecified retinopathy severity ([place]) Assessment & Plan: Sees endocrinology. Low carb diet.  Dexcom.  Follow.  Most recent A1c 6.5%. recheck today.    Orders: -     Hemoglobin A1c  Anemia, unspecified type Assessment & Plan: Labs have been followed through dialysis.  Request full panel of labs to be drawn today.     Bilateral carotid artery disease, unspecified type ([place]) Assessment & Plan: Last carotid ultrasound - <50% bilaterally.  Needs to take statin and keep blood pressure under control.    Essential  hypertension Assessment & Plan: Blood pressure has been doing better.  Continue hydralazine and amlodipine.  Follow pressures.  Follow metabolic panel.    History of CVA (cerebrovascular accident) Assessment & Plan: Continue aspirin and lipitor.  Continue blood pressure control.      Hydronephrosis, left Assessment & Plan: Reviewed notes. Previously in hospital. CT scan showed moderate left hydronephrosis without obstructive stone.  Patient may have passed a stone.  Consulted urology, Dr. Jeffie Pollock  Recommend - cystoscopy with retrograde pyelography and possible ureteroscopy and stent. It appears tried to schedule as outpatient.  Will contact pt regarding question of f/u.    Neuropathy Assessment & Plan: Continue lyrica.  Follow.    Type 2 diabetes mellitus with stage 3 chronic kidney disease, with long-term current use of insulin, unspecified whether stage 3a or 3b CKD ([place]) Assessment & Plan: Low carb diet and exercise.  No change in medication.  Seeing endocrinology.  Last A1c reported 6.5. Follow met b and a1c.    Unsteady gait Assessment & Plan: Working with PT for strengthening and balance.  PT - recommended orthotics.  Discuss with podiatry.  Has issues with his foot turning in - affecting gait.    Weakness Assessment & Plan: Weakness and unsteadiness.  Neuropathy.  Anemic.  ESRD.  Has improved.  Continue PT.       [name], MD

## 2022-10-04 NOTE — Assessment & Plan Note (Signed)
Physical today 10/04/22. .  Colonoscopy overdue.  Needs psa checked.

## 2022-10-05 ENCOUNTER — Encounter: Payer: Self-pay | Admitting: Internal Medicine

## 2022-10-05 DIAGNOSIS — R2681 Unsteadiness on feet: Secondary | ICD-10-CM | POA: Insufficient documentation

## 2022-10-05 NOTE — Assessment & Plan Note (Signed)
Weakness and unsteadiness.  Neuropathy.  Anemic.  ESRD.  Has improved.  Continue PT.

## 2022-10-05 NOTE — Assessment & Plan Note (Signed)
Labs have been followed through dialysis.  Request full panel of labs to be drawn today.

## 2022-10-05 NOTE — Assessment & Plan Note (Signed)
Reviewed notes. Previously in hospital. CT scan showed moderate left hydronephrosis without obstructive stone.  Patient may have passed a stone.  Consulted urology, Dr. Jeffie Pollock  Recommend - cystoscopy with retrograde pyelography and possible ureteroscopy and stent. It appears tried to schedule as outpatient.  Will contact pt regarding question of f/u.

## 2022-10-05 NOTE — Assessment & Plan Note (Signed)
Continue lyrica.  Follow.

## 2022-10-05 NOTE — Assessment & Plan Note (Signed)
On thyroid replacement.  Follow tsh. Instructed on importance of taking regularly.

## 2022-10-05 NOTE — Assessment & Plan Note (Signed)
Working with PT for strengthening and balance.  PT - recommended orthotics.  Discuss with podiatry.  Has issues with his foot turning in - affecting gait.

## 2022-10-05 NOTE — Assessment & Plan Note (Signed)
Low carb diet and exercise.  No change in medication.  Seeing endocrinology.  Last A1c reported 6.5. Follow met b and a1c.  

## 2022-10-05 NOTE — Assessment & Plan Note (Signed)
Sees endocrinology. Low carb diet.  Dexcom.  Follow.  Most recent A1c 6.5%. recheck today.

## 2022-10-05 NOTE — Assessment & Plan Note (Signed)
Continue aspirin and lipitor.  Continue blood pressure control.

## 2022-10-05 NOTE — Assessment & Plan Note (Signed)
Low cholesterol diet and exercise.  Follow lipid panel and liver function tests.  

## 2022-10-05 NOTE — Assessment & Plan Note (Signed)
Blood pressure has been doing better.  Continue hydralazine and amlodipine.  Follow pressures.  Follow metabolic panel.

## 2022-10-05 NOTE — Assessment & Plan Note (Signed)
Last carotid ultrasound - <50% bilaterally.  Needs to take statin and keep blood pressure under control.

## 2022-10-05 NOTE — Assessment & Plan Note (Signed)
Sees nephrology.  On dialysis.  Tolerating. Request labs today as outlined.

## 2022-10-05 NOTE — Telephone Encounter (Signed)
Followed by nephrology - ESRD - dialysis.

## 2022-10-07 ENCOUNTER — Ambulatory Visit: Payer: Medicare Other

## 2022-10-07 ENCOUNTER — Other Ambulatory Visit: Payer: Self-pay

## 2022-10-07 DIAGNOSIS — R2681 Unsteadiness on feet: Secondary | ICD-10-CM

## 2022-10-07 DIAGNOSIS — N200 Calculus of kidney: Secondary | ICD-10-CM

## 2022-10-07 DIAGNOSIS — R262 Difficulty in walking, not elsewhere classified: Secondary | ICD-10-CM | POA: Diagnosis not present

## 2022-10-07 DIAGNOSIS — M6281 Muscle weakness (generalized): Secondary | ICD-10-CM

## 2022-10-07 DIAGNOSIS — E039 Hypothyroidism, unspecified: Secondary | ICD-10-CM

## 2022-10-07 DIAGNOSIS — E785 Hyperlipidemia, unspecified: Secondary | ICD-10-CM

## 2022-10-07 MED ORDER — ROSUVASTATIN CALCIUM 5 MG PO TABS: 5.0000 mg | ORAL_TABLET | Freq: Every day | ORAL | 3 refills | Status: DC

## 2022-10-08 NOTE — Therapy (Signed)
OUTPATIENT PHYSICAL THERAPY NEURO TREATMENT   Patient Name: Gregory Crane MRN: [id number] DOB:July 01, 1971, 52 y.o., male Today's Date: 10/09/2022  PCP: [name] MD REFERRING PROVIDER: [name] MD  PT End of Session - 10/09/22 1009     Visit Number 28    Number of Visits 40    Date for PT Re-Evaluation 11/13/22    Authorization Type Medicare A&B; Medicaid; 09/11/22    Authorization Time Period 06/24/22-12/121/23 for 12 visits    Authorization - Number of Visits 12    Progress Note Due on Visit 10    PT Start Time 1015    PT Stop Time 1059    PT Time Calculation (min) 44 min    Equipment Utilized During Treatment Gait belt    Activity Tolerance Patient tolerated treatment well;No increased pain;Patient limited by fatigue    Behavior During Therapy [facility] for tasks assessed/performed                          Past Medical History:  Diagnosis Date   Allergy    Anemia    Bell's palsy    Diabetes mellitus without complication ([place])    diet controlled   Hypertension    Hypothyroidism    Kidney stones    Pseudotumor cerebri    Stroke [facility])    Past Surgical History:  Procedure Laterality Date   COLONOSCOPY WITH PROPOFOL N/A 03/30/2020   Procedure: COLONOSCOPY WITH PROPOFOL;  Surgeon: Lesly Rubenstein, MD;  Location: [facility] ENDOSCOPY;  Service: Endoscopy;  Laterality: N/A;   LOOP RECORDER INSERTION N/A 01/27/2018   Procedure: LOOP RECORDER INSERTION;  Surgeon: [name], MD;  Location: [place] CV LAB;  Service: Cardiovascular;  Laterality: N/A;   LUMBAR PUNCTURE     as child   NO PAST SURGERIES     TEE WITHOUT CARDIOVERSION N/A 01/07/2018   Procedure: TRANSESOPHAGEAL ECHOCARDIOGRAM (TEE);  Surgeon: Minna Merritts, MD;  Location: [facility] ORS;  Service: Cardiovascular;  Laterality: N/A;   Patient Active Problem List   Diagnosis Date Noted   Unsteady gait 10/05/2022   Hydronephrosis, left 05/04/2022   Type II diabetes mellitus with renal  manifestations ([place]) 05/04/2022   Leukocytosis 05/04/2022   Pre-op evaluation 04/24/2022   Headache 07/07/2021   Hearing loss 07/07/2021   Open wound 01/25/2021   History of colon polyps 10/15/2020   Postoperative hemorrhage involving digestive system following digestive system procedure 09/19/2020   Acute cholecystitis without calculus 09/09/2020   Type 2 diabetes mellitus, with long-term current use of insulin ([place]) 09/09/2020   Cryptogenic stroke ([place]) 07/13/2020   History of loop recorder 07/13/2020   Weakness 06/18/2020   History of 2019 novel coronavirus disease (COVID-19) 05/20/2020   ESRD (end stage renal disease) ([place]) 04/04/2020   Pneumonia due to COVID-19 virus 04/03/2020   AKI (acute kidney injury) ([place]) 04/03/2020   Elevated troponin 04/03/2020   Acquired trigger finger 06/08/2019   Lymphedema 06/08/2019   Anemia 04/17/2019   Swelling of left lower extremity 01/10/2019   Facial droop 04/09/2018   Daytime somnolence 03/30/2018   Carotid artery disease ([place]) 02/03/2018   Intracranial vascular stenosis 09/12/2017   Cough 01/20/2017   Bell's palsy 11/10/2016   History of CVA (cerebrovascular accident) 11/10/2016   Benign localized hyperplasia of prostate with urinary obstruction 10/27/2016   History of nephrolithiasis 10/27/2016   TIA (transient ischemic attack) 10/20/2016   Near syncope 06/23/2016   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   Neuropathy  08/06/2015   Health care maintenance 08/06/2015   Essential hypertension 08/06/2015   Heme positive stool 10/10/2013   Hypothyroidism 10/10/2013   Microalbuminuria 10/10/2013   Hyperlipidemia 10/10/2013   B12 deficiency 10/10/2013   Diabetes ([place]) 07/04/2013   Environmental allergies 07/04/2013   ONSET DATE: 2-3 years  REFERRING DIAG: Neuropathy  THERAPY DIAG:  Difficulty in walking, not elsewhere classified  Muscle weakness (generalized)  Unsteadiness on feet  Abnormality of gait and mobility  Rationale for  Evaluation and Treatment Rehabilitation  SUBJECTIVE:                                                                                                                                                                                             SUBJECTIVE STATEMENT: Patient reports no falls or LOB since last session. Has been working on a home loan.     Pt accompanied by: self  PERTINENT HISTORY: Patient presents to physical therapy for neuropathy. He underwent banding of his L UE fistula on 05/13/22 and scheduled for additional banding on 06/07/22. 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 Patient reports his balance is very unsteady due to neuropathy in feet, feels weak in LE's.   PAIN:  Are you having pain? Yes: NPRS scale: 0/10 Pain location: L foot Pain description: sharp  Aggravating factors: worse at night Relieving factors: lyrica   PRECAUTIONS: Fall  WEIGHT BEARING RESTRICTIONS Yes no lifting >5 lb in arm   FALLS: Has patient fallen in last 6 months? No  LIVING ENVIRONMENT: Lives with: lives alone Lives in: House/apartment Stairs: Yes: Internal: flight steps; on right going up Has following equipment at home: Single point cane and Walker - 2 wheeled  PLOF: Independent  PATIENT GOALS to be more steady and walk as normally as possible.   OBJECTIVE:   LOWER EXTREMITY MMT:    MMT Right Eval Left Eval  Hip flexion 4- 3+  Hip extension    Hip abduction 4- 4+  Hip adduction 4- 3+  Knee flexion 4 4-  Knee extension 4 4  Ankle dorsiflexion 2+ 2+  Ankle plantarflexion 3+ 3+  (Blank rows = not tested)  FUNCTIONAL TESTs:  5 times sit to stand: 19.6 seconds with walking stick; one LOB 10 meter walk test: 12 seconds Berg Balance Scale: 33/56  PATIENT SURVEYS:  ABC scale 67% FOTO  53%  TODAY'S TREATMENT:     TREATMENT:    Neuro re-ed   In // bar: Airex pad: sort letters by colors  x8 minutes    TherEx:  Nustep seat position 9; SPM>80 Level 4 4 minutes  2lb ankle weights: ambulate 450 ft with CGA and turns with changing surface and busy hallways  Standing march 15x each LE into RTB across bars    Seated  2lb ankle weight: -LAQ 3 second holds 15x -step over hedgehog and back 15x each side  -heel toe raise 20x  Adduction ball squeeze 20x Adduction ball squeeze with heel raise 20x    PATIENT EDUCATION: Education details: HEP, goals, POC Person educated: Patient Education method: Explanation, Demonstration, Tactile cues, Verbal cues, and Handouts Education comprehension: verbalized understanding, returned demonstration, verbal cues required, and tactile cues required  HOME EXERCISE PROGRAM: Access Code: [email or web address] URL: [email or web address] Date: 06/03/2022 Prepared by: Janna Arch  Exercises - Seated Heel Toe Raises  - 1 x daily - 7 x weekly - 2 sets - 10 reps - 5 hold - Standing Tandem Balance with Counter Support  - 1 x daily - 7 x weekly - 2 sets - 2 reps - 30 hold - Standing March with Counter Support  - 1 x daily - 7 x weekly - 2 sets - 10 reps - 5 hold   GOALS: Goals reviewed with patient? Yes   SHORT TERM GOALS: Target date: 07/01/2022  Patient will be independent in home exercise program to improve strength/mobility for better functional independence with ADLs. Baseline:10/9; HEP given  Goal status: INITIAL   LONG TERM GOALS: Target date: 11/13/2022  Patient will increase FOTO score to equal to or greater than   63%  to demonstrate statistically significant improvement in mobility and quality of life.  Baseline: 10/9: 53% 11/29: 58% 12/27: 51% 1/17: 56%  Goal status: IN PROGRESS  2.  Patient (< 101 years old) will complete five times sit to stand test in < 10 seconds without UE support indicating an increased LE  strength and improved balance. Baseline: 10/9: 19.6 seconds with walking stick; one LOB 11/29: 20.56 seconds 12/27: 13.4 seconds hands on knees 1/17: 13 seconds hands on knees  Goal status: IN PROGRESS  3.  Patient will increase Berg Balance score by > 45/56 to demonstrate decreased fall risk during functional activities. Baseline: 10/9: 33/56 11/29: 43/56 12/27: 44/56 1/17:  44/56 Goal status: IN PROGRESS  4.  Patient will increase 10 meter walk test to >1.89ms as to improve gait speed for better community ambulation and to reduce fall risk. Baseline: 11/29: 1.19 m/s Goal status: MET  5.  Patient will increase ABC scale score >80% to demonstrate better functional mobility and better confidence with ADLs.  Baseline: 10/9: 67% 11/29: 75.6% 12/27: 78%  Goal status: IN PROGRESS  6. Patient will tolerate 5 seconds of single leg stance without loss of balance to improve ability to get in and out of shower safely. Baseline: 11/29: unable to stand on single leg safely 12/27: 2 seconds 1/17: 3 seconds Goal status: IN PROGRESS   6. Patient will increase six minute walk test distance to >1000 for progression to community ambulator and improve gait ability Baseline: 12/27: 845 ft with with walking stick 1/17: [id number] status: partially met    ASSESSMENT:  CLINICAL IMPRESSION:  Patient presents with excellent motivation. Focus on prolonging muscle recruitment pathways with prolonged ambulation and exercises. Patient is challenged with dorsiflexion in seated indicating continued area of focus. Patient has no episodes of LOB in standing this session. Pt will continue to benefit from skilled therapy to address remaining deficits in order to  improve overall QoL and return to PLOF.     OBJECTIVE IMPAIRMENTS Abnormal gait, decreased activity tolerance, decreased balance, decreased coordination, decreased endurance, decreased mobility, difficulty walking, decreased strength, impaired flexibility,  impaired sensation, improper body mechanics, and pain.   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   REHAB POTENTIAL: Good  CLINICAL DECISION MAKING: Evolving/moderate complexity  EVALUATION COMPLEXITY: Moderate  PLAN: PT FREQUENCY: 2x/week  PT DURATION: 12 weeks  PLANNED INTERVENTIONS: Therapeutic exercises, Therapeutic activity, Neuromuscular re-education, Balance training, Gait training, Patient/Family education, Self Care, Joint mobilization, Stair training, Vestibular training, Canalith repositioning, Visual/preceptual remediation/compensation, DME instructions, Dry Needling, Cognitive remediation, Spinal mobilization, Cryotherapy, Moist heat, Manual lymph drainage, Compression bandaging, Taping, Vasopneumatic device, Ultrasound, Manual therapy, and Re-evaluation  PLAN FOR NEXT SESSION: Continue to progress dynamic balance and dual tasking balance. Work on single leg balance progressions.     Janna Arch PT  Physical Therapist- [facility]  10/09/22, 11:41 AM

## 2022-10-09 ENCOUNTER — Ambulatory Visit: Payer: Medicare Other

## 2022-10-09 DIAGNOSIS — R269 Unspecified abnormalities of gait and mobility: Secondary | ICD-10-CM

## 2022-10-09 DIAGNOSIS — R262 Difficulty in walking, not elsewhere classified: Secondary | ICD-10-CM | POA: Diagnosis not present

## 2022-10-09 DIAGNOSIS — R2681 Unsteadiness on feet: Secondary | ICD-10-CM

## 2022-10-09 DIAGNOSIS — M6281 Muscle weakness (generalized): Secondary | ICD-10-CM

## 2022-10-10 NOTE — Therapy (Incomplete)
OUTPATIENT PHYSICAL THERAPY NEURO TREATMENT   Patient Name: Gregory Crane MRN: [id number] DOB:[birth year], 52 y.o., male Today's Date: 10/10/2022  PCP: [name] MD REFERRING PROVIDER: [name] MD                 Past Medical History:  Diagnosis Date   Allergy    Anemia    Bell's palsy    Diabetes mellitus without complication ([place])    diet controlled   Hypertension    Hypothyroidism    Kidney stones    Pseudotumor cerebri    Stroke [facility])    Past Surgical History:  Procedure Laterality Date   COLONOSCOPY WITH PROPOFOL N/A 03/30/2020   Procedure: COLONOSCOPY WITH PROPOFOL;  Surgeon: Lesly Rubenstein, MD;  Location: [facility] ENDOSCOPY;  Service: Endoscopy;  Laterality: N/A;   LOOP RECORDER INSERTION N/A 01/27/2018   Procedure: LOOP RECORDER INSERTION;  Surgeon: [name], MD;  Location: [place] CV LAB;  Service: Cardiovascular;  Laterality: N/A;   LUMBAR PUNCTURE     as child   NO PAST SURGERIES     TEE WITHOUT CARDIOVERSION N/A 01/07/2018   Procedure: TRANSESOPHAGEAL ECHOCARDIOGRAM (TEE);  Surgeon: Minna Merritts, MD;  Location: [facility] ORS;  Service: Cardiovascular;  Laterality: N/A;   Patient Active Problem List   Diagnosis Date Noted   Unsteady gait 10/05/2022   Hydronephrosis, left 05/04/2022   Type II diabetes mellitus with renal manifestations ([place]) 05/04/2022   Leukocytosis 05/04/2022   Pre-op evaluation 04/24/2022   Headache 07/07/2021   Hearing loss 07/07/2021   Open wound 01/25/2021   History of colon polyps 10/15/2020   Postoperative hemorrhage involving digestive system following digestive system procedure 09/19/2020   Acute cholecystitis without calculus 09/09/2020   Type 2 diabetes mellitus, with long-term current use of insulin ([place]) 09/09/2020   Cryptogenic stroke ([place]) 07/13/2020   History of loop recorder 07/13/2020   Weakness 06/18/2020   History of 2019 novel coronavirus disease (COVID-19) 05/20/2020   ESRD  (end stage renal disease) ([place]) 04/04/2020   Pneumonia due to COVID-19 virus 04/03/2020   AKI (acute kidney injury) ([place]) 04/03/2020   Elevated troponin 04/03/2020   Acquired trigger finger 06/08/2019   Lymphedema 06/08/2019   Anemia 04/17/2019   Swelling of left lower extremity 01/10/2019   Facial droop 04/09/2018   Daytime somnolence 03/30/2018   Carotid artery disease ([place]) 02/03/2018   Intracranial vascular stenosis 09/12/2017   Cough 01/20/2017   Bell's palsy 11/10/2016   History of CVA (cerebrovascular accident) 11/10/2016   Benign localized hyperplasia of prostate with urinary obstruction 10/27/2016   History of nephrolithiasis 10/27/2016   TIA (transient ischemic attack) 10/20/2016   Near syncope 06/23/2016   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  Rationale for Evaluation and Treatment Rehabilitation  SUBJECTIVE:  SUBJECTIVE STATEMENT: ***   Pt accompanied by: self  PERTINENT HISTORY: Patient presents to physical therapy for neuropathy. He underwent banding of his L UE fistula on 05/13/22 and scheduled for additional banding on 06/07/22. 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 Patient reports his balance is very  unsteady due to neuropathy in feet, feels weak in LE's.   PAIN:  Are you having pain? Yes: NPRS scale: 0/10 Pain location: L foot Pain description: sharp  Aggravating factors: worse at night Relieving factors: lyrica   PRECAUTIONS: Fall  WEIGHT BEARING RESTRICTIONS Yes no lifting >5 lb in arm   FALLS: Has patient fallen in last 6 months? No  LIVING ENVIRONMENT: Lives with: lives alone Lives in: House/apartment Stairs: Yes: Internal: flight steps; on right going up Has following equipment at home: Single point cane and Walker - 2 wheeled  PLOF: Independent  PATIENT GOALS to be more steady and walk as normally as possible.   OBJECTIVE:   LOWER EXTREMITY MMT:    MMT Right Eval Left Eval  Hip flexion 4- 3+  Hip extension    Hip abduction 4- 4+  Hip adduction 4- 3+  Knee flexion 4 4-  Knee extension 4 4  Ankle dorsiflexion 2+ 2+  Ankle plantarflexion 3+ 3+  (Blank rows = not tested)  FUNCTIONAL TESTs:  5 times sit to stand: 19.6 seconds with walking stick; one LOB 10 meter walk test: 12 seconds Berg Balance Scale: 33/56  PATIENT SURVEYS:  ABC scale 67% FOTO 53%  TODAY'S TREATMENT:     TREATMENT:    Neuro re-ed   In // bar: Airex pad: sort letters by colors x8 minutes    TherEx:  Nustep seat position 9; SPM>80 Level 4 4 minutes  2lb ankle weights: ambulate 450 ft with CGA and turns with changing surface and busy hallways  Standing march 15x each LE into RTB across bars    Seated  2lb ankle weight: -LAQ 3 second holds 15x -step over hedgehog and back 15x each side  -heel toe raise 20x  Adduction ball squeeze 20x Adduction ball squeeze with heel raise 20x    PATIENT EDUCATION: Education details: HEP, goals, POC Person educated: Patient Education method: Explanation, Demonstration, Tactile cues, Verbal cues, and Handouts Education comprehension: verbalized understanding, returned demonstration, verbal cues required, and tactile cues  required  HOME EXERCISE PROGRAM: Access Code: [email or web address] URL: [email or web address] Date: 06/03/2022 Prepared by: Janna Arch  Exercises - Seated Heel Toe Raises  - 1 x daily - 7 x weekly - 2 sets - 10 reps - 5 hold - Standing Tandem Balance with Counter Support  - 1 x daily - 7 x weekly - 2 sets - 2 reps - 30 hold - Standing March with Counter Support  - 1 x daily - 7 x weekly - 2 sets - 10 reps - 5 hold   GOALS: Goals reviewed with patient? Yes   SHORT TERM GOALS: Target date: 07/01/2022  Patient will be independent in home exercise program to improve strength/mobility for better functional independence with ADLs. Baseline:10/9; HEP given  Goal status: INITIAL   LONG TERM GOALS: Target date: 11/13/2022  Patient will increase FOTO score to equal to or greater than   63%  to demonstrate statistically significant improvement in mobility and quality of life.  Baseline: 10/9: 53% 11/29: 58% 12/27: 51% 1/17: 56%  Goal status: IN PROGRESS  2.  Patient (< 63 years old) will complete five times sit to stand test in < 10 seconds without UE support indicating an increased LE strength and improved balance. Baseline: 10/9: 19.6 seconds with walking stick; one LOB 11/29: 20.56 seconds 12/27: 13.4 seconds hands on knees 1/17: 13 seconds hands on knees  Goal status: IN PROGRESS  3.  Patient will increase Berg Balance score by > 45/56 to demonstrate decreased fall risk during functional activities. Baseline: 10/9: 33/56 11/29: 43/56 12/27: 44/56 1/17:  44/56 Goal status: IN PROGRESS  4.  Patient will increase 10 meter walk test to >1.43ms as to improve gait speed for better community ambulation and to reduce fall risk. Baseline: 11/29: 1.19 m/s Goal status: MET  5.  Patient will increase ABC scale score >80% to demonstrate better functional mobility and better confidence with ADLs.  Baseline: 10/9: 67% 11/29: 75.6% 12/27: 78%  Goal status: IN PROGRESS  6. Patient will  tolerate 5 seconds of single leg stance without loss of balance to improve ability to get in and out of shower safely. Baseline: 11/29: unable to stand on single leg safely 12/27: 2 seconds 1/17: 3 seconds Goal status: IN PROGRESS   6. Patient will increase six minute walk test distance to >1000 for progression to community ambulator and improve gait ability Baseline: 12/27: 845 ft with with walking stick 1/17: [id number] status: partially met    ASSESSMENT:  CLINICAL IMPRESSION:  *** Pt will continue to benefit from skilled therapy to address remaining deficits in order to improve overall QoL and return to PLOF.     OBJECTIVE IMPAIRMENTS Abnormal gait, decreased activity tolerance, decreased balance, decreased coordination, decreased endurance, decreased mobility, difficulty walking, decreased strength, impaired flexibility, impaired sensation, improper body mechanics, and pain.   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   REHAB POTENTIAL: Good  CLINICAL DECISION MAKING: Evolving/moderate complexity  EVALUATION COMPLEXITY: Moderate  PLAN: PT FREQUENCY: 2x/week  PT DURATION: 12 weeks  PLANNED INTERVENTIONS: Therapeutic exercises, Therapeutic activity,  Neuromuscular re-education, Balance training, Gait training, Patient/Family education, Self Care, Joint mobilization, Stair training, Vestibular training, Canalith repositioning, Visual/preceptual remediation/compensation, DME instructions, Dry Needling, Cognitive remediation, Spinal mobilization, Cryotherapy, Moist heat, Manual lymph drainage, Compression bandaging, Taping, Vasopneumatic device, Ultrasound, Manual therapy, and Re-evaluation  PLAN FOR NEXT SESSION: Continue to progress dynamic balance and dual tasking balance. Work on single leg balance progressions.     MaJanna ArchT  Physical Therapist- [facility]02/15/24, 5:11 PM

## 2022-10-14 ENCOUNTER — Ambulatory Visit: Payer: Medicare Other

## 2022-10-14 DIAGNOSIS — R262 Difficulty in walking, not elsewhere classified: Secondary | ICD-10-CM | POA: Diagnosis not present

## 2022-10-14 DIAGNOSIS — M6281 Muscle weakness (generalized): Secondary | ICD-10-CM

## 2022-10-14 DIAGNOSIS — R269 Unspecified abnormalities of gait and mobility: Secondary | ICD-10-CM

## 2022-10-14 DIAGNOSIS — R2681 Unsteadiness on feet: Secondary | ICD-10-CM

## 2022-10-14 NOTE — Therapy (Unsigned)
OUTPATIENT PHYSICAL THERAPY TREATMENT   Patient Name: Gregory Crane MRN: [id number] DOB:1970/09/07, 52 y.o., male Today's Date: 10/14/2022  PCP: [name] MD REFERRING PROVIDER: [name] MD  PT End of Session - 10/14/22 1020     Visit Number 29    Number of Visits 40    Date for PT Re-Evaluation 11/13/22    Authorization Type Medicare A&B; Medicaid; 09/11/22    Authorization Time Period 08/21/22-11/13/22    Progress Note Due on Visit 30    PT Start Time 1015    PT Stop Time 1055    PT Time Calculation (min) 40 min    Activity Tolerance Patient tolerated treatment well;No increased pain;Patient limited by fatigue    Behavior During Therapy [facility] for tasks assessed/performed                Past Medical History:  Diagnosis Date   Allergy    Anemia    Bell's palsy    Diabetes mellitus without complication ([place])    diet controlled   Hypertension    Hypothyroidism    Kidney stones    Pseudotumor cerebri    Stroke [facility])    Past Surgical History:  Procedure Laterality Date   COLONOSCOPY WITH PROPOFOL N/A 03/30/2020   Procedure: COLONOSCOPY WITH PROPOFOL;  Surgeon: Lesly Rubenstein, MD;  Location: [facility] ENDOSCOPY;  Service: Endoscopy;  Laterality: N/A;   LOOP RECORDER INSERTION N/A 01/27/2018   Procedure: LOOP RECORDER INSERTION;  Surgeon: [name], MD;  Location: [facility];  Service: Cardiovascular;  Laterality: N/A;   LUMBAR PUNCTURE     as child   NO PAST SURGERIES     TEE WITHOUT CARDIOVERSION N/A 01/07/2018   Procedure: TRANSESOPHAGEAL ECHOCARDIOGRAM (TEE);  Surgeon: Minna Merritts, MD;  Location: [facility] ORS;  Service: Cardiovascular;  Laterality: N/A;   Patient Active Problem List   Diagnosis Date Noted   Unsteady gait 10/05/2022   Hydronephrosis, left 05/04/2022   Type II diabetes mellitus with renal manifestations ([place]) 05/04/2022   Leukocytosis 05/04/2022   Pre-op evaluation 04/24/2022   Headache 07/07/2021   Hearing loss  07/07/2021   Open wound 01/25/2021   History of colon polyps 10/15/2020   Postoperative hemorrhage involving digestive system following digestive system procedure 09/19/2020   Acute cholecystitis without calculus 09/09/2020   Type 2 diabetes mellitus, with long-term current use of insulin ([place]) 09/09/2020   Cryptogenic stroke ([place]) 07/13/2020   History of loop recorder 07/13/2020   Weakness 06/18/2020   History of 2019 novel coronavirus disease (COVID-19) 05/20/2020   ESRD (end stage renal disease) ([place]) 04/04/2020   Pneumonia due to COVID-19 virus 04/03/2020   AKI (acute kidney injury) ([place]) 04/03/2020   Elevated troponin 04/03/2020   Acquired trigger finger 06/08/2019   Lymphedema 06/08/2019   Anemia 04/17/2019   Swelling of left lower extremity 01/10/2019   Facial droop 04/09/2018   Daytime somnolence 03/30/2018   Carotid artery disease ([place]) 02/03/2018   Intracranial vascular stenosis 09/12/2017   Cough 01/20/2017   Bell's palsy 11/10/2016   History of CVA (cerebrovascular accident) 11/10/2016   Benign localized hyperplasia of prostate with urinary obstruction 10/27/2016   History of nephrolithiasis 10/27/2016   TIA (transient ischemic attack) 10/20/2016   Near syncope 06/23/2016   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   Neuropathy 08/06/2015   Health care maintenance 08/06/2015   Essential hypertension 08/06/2015   Heme positive stool 10/10/2013   Hypothyroidism 10/10/2013   Microalbuminuria 10/10/2013   Hyperlipidemia 10/10/2013  B12 deficiency 10/10/2013   Diabetes ([place]) 07/04/2013   Environmental allergies 07/04/2013   ONSET DATE: 2-3 years  REFERRING DIAG: Neuropathy  THERAPY DIAG:  Difficulty in walking, not elsewhere classified  Muscle weakness (generalized)  Unsteadiness on feet  Abnormality of gait and mobility  Rationale for Evaluation and Treatment Rehabilitation  SUBJECTIVE:                                                                                                                                                                                              SUBJECTIVE STATEMENT:  No updates since last session. Pt continues to feel balance improving, less cane use at home. Pt reports no recent falls, no pain today.    Pt accompanied by: self  PERTINENT HISTORY: Patient presents to physical therapy for neuropathy. He underwent banding of his L UE fistula on 05/13/22 and scheduled for additional banding on 06/07/22. 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 Patient reports his balance is very unsteady due to neuropathy in feet, feels weak in LE's.   PAIN:  Are you having pain? No   PRECAUTIONS: Fall  WEIGHT BEARING RESTRICTIONS Yes no lifting >5 lb in arm   FALLS: Has patient fallen in last 6 months? No  LIVING ENVIRONMENT: Lives with: lives alone Lives in: House/apartment Stairs: Yes: Internal: flight steps; on right going up Has following equipment at home: Single point cane and Walker - 2 wheeled  PLOF: Independent  PATIENT GOAL:  to be more steady and walk as normally as possible.   OBJECTIVE:   TODAY'S TREATMENT .:  -AA/ROM on Nustep, seat 9, arms 9, x 6 minutes  -airex pad balance with basketball self toss/catch 1x20 -alteranting bar taps with basketball (still on airex pad) 1x20 -eyes closed normal stance airex 1x30sec   *seated recovery   -Fwd step ups 30, alternating 4" step + airex pad BUE support (cues for foot clearance)  -airex pad balance with yellow weight ball self toss/catch 1x20 -alteranting bar taps with yellow weight ball (still on airex pad) 1x20  *seated recovery   -2lb AW AMB  around obtacles in room, circles around equipment, then repeat wth yellow ball carry   *seated recovery   -cable resisted AMB in 4  directions, soft/firm surfaces, and set ups/downs   PATIENT EDUCATION: Education details: HEP, goals, POC Person educated: Patient Education method: Explanation, Demonstration, Tactile cues, Verbal cues, and Handouts Education comprehension: verbalized understanding, returned demonstration, verbal cues required, and tactile cues required  HOME EXERCISE PROGRAM: Access Code: [email or web address] URL: [email or web address] Date: 06/03/2022 Prepared by: Janna Arch  Exercises - Seated Heel Toe Raises  - 1 x daily - 7 x weekly - 2 sets - 10 reps - 5 hold - Standing Tandem Balance with Counter Support  - 1 x daily - 7 x weekly - 2 sets - 2 reps - 30 hold - Standing March with Counter Support  - 1 x daily - 7 x weekly - 2 sets - 10 reps - 5 hold   GOALS: Goals reviewed with patient? Yes   SHORT TERM GOALS: Target date: 07/01/2022  Patient will be independent in home exercise program to improve strength/mobility for better functional independence with ADLs. Baseline:10/9; HEP given  Goal status: INITIAL   LONG TERM GOALS: Target date: 11/13/2022  Patient will increase FOTO score to equal to or greater than   63%  to demonstrate statistically significant improvement in mobility and quality of life.  Baseline: 10/9: 53% 11/29: 58% 12/27: 51% 1/17: 56%  Goal status: IN PROGRESS  2.  Patient (< 63 years old) will complete five times sit to stand test in < 10 seconds without UE support indicating an increased LE strength and improved balance. Baseline: 10/9: 19.6 seconds with walking stick; one LOB 11/29: 20.56 seconds 12/27: 13.4 seconds hands on knees 1/17: 13 seconds hands on knees  Goal status: IN PROGRESS  3.  Patient will increase Berg Balance score by > 45/56 to demonstrate decreased fall risk during functional activities. Baseline: 10/9: 33/56 11/29: 43/56 12/27: 44/56 1/17:  44/56 Goal status: IN PROGRESS  4.  Patient will increase 10 meter walk test to >1.38ms as to  improve gait speed for better community ambulation and to reduce fall risk. Baseline: 11/29: 1.19 m/s Goal status: MET  5.  Patient will increase ABC scale score >80% to demonstrate better functional mobility and better confidence with ADLs.  Baseline: 10/9: 67% 11/29: 75.6% 12/27: 78%  Goal status: IN PROGRESS  6. Patient will tolerate 5 seconds of single leg stance without loss of balance to improve ability to get in and out of shower safely. Baseline: 11/29: unable to stand on single leg safely 12/27: 2 seconds 1/17: 3 seconds Goal status: IN PROGRESS   6. Patient will increase six minute walk test distance to >1000 for progression to community ambulator and improve gait ability Baseline: 12/27: 845 ft with with walking stick 1/17: [id number] status: partially met   ASSESSMENT: CLINICAL IMPRESSION: Session tolerated as planned, recovery intervals provided as needed. Pt remains focussed and motivated, but deficits in stamina, balance, and strength continue to warrant attention to optimize response to rehabilitation.     OBJECTIVE IMPAIRMENTS Abnormal gait, decreased activity tolerance, decreased balance, decreased coordination, decreased endurance, decreased mobility, difficulty walking, decreased strength, impaired flexibility, impaired sensation, improper body mechanics, and pain.   ACTIVITY LIMITATIONS carrying, lifting, bending, standing, squatting, sleeping, stairs, transfers, bed mobility, bathing, toileting, dressing, locomotion level, and caring for others  PARTICIPATION LIMITATIONS: meal prep, cleaning, laundry, medication management, personal finances, interpersonal relationship, driving, shopping, community activity, and yard work  PERSONAL FACTORS Age, Fitness, Past/current experiences, Time since onset of injury/illness/exacerbation, Transportation, and 3+ comorbidities: CAD, cryptogenic stroke (2021), deafness in R ear, diabetic retinopathy, dialysis, ESRD, COVID, HTN,  hypothyroidism, leukocytosis, nephrolithiasis, neuropathy, Pseudotumor cerebri 1986, sepsis, DM type I, DM type II  are also affecting patient's functional outcome.   REHAB POTENTIAL: Good  CLINICAL DECISION MAKING: Evolving/moderate complexity  EVALUATION COMPLEXITY: Moderate  PLAN:  PT FREQUENCY: 2x/week  PT DURATION: 12 weeks  PLANNED INTERVENTIONS: Therapeutic exercises, Therapeutic activity, Neuromuscular re-education, Balance training, Gait training, Patient/Family education, Self Care, Joint mobilization, Stair training, Vestibular training, Canalith repositioning, Visual/preceptual remediation/compensation, DME instructions, Dry Needling, Cognitive remediation, Spinal mobilization, Cryotherapy, Moist heat, Manual lymph drainage, Compression bandaging, Taping, Vasopneumatic device, Ultrasound, Manual therapy, and Re-evaluation  PLAN FOR NEXT SESSION: Continue to progress dynamic balance and dual tasking balance. Work on single leg balance progressions.    Etta Grandchild PT  Physical Therapist- [facility]  10/14/22, 10:23 AM  10:23 AM, 10/14/22 Etta Grandchild, PT, DPT Physical Therapist - [facility] [phone]

## 2022-10-15 NOTE — Therapy (Signed)
OUTPATIENT PHYSICAL THERAPY NEURO TREATMENT/ Physical Therapy Progress Note   Dates of reporting period  09/11/22   to   10/16/22    Patient Name: Gregory Crane MRN: [id number] DOB:04/21/1971, 52 y.o., male Today's Date: 10/16/2022  PCP: [name] MD REFERRING PROVIDER: [name] MD  PT End of Session - 10/16/22 1013     Visit Number 30    Number of Visits 40    Date for PT Re-Evaluation 11/13/22    Authorization Type Medicare A&B; Medicaid; 09/11/22    Authorization Time Period 08/21/22-11/13/22    Progress Note Due on Visit 30    PT Start Time 1015    PT Stop Time 1058    PT Time Calculation (min) 43 min    Activity Tolerance Patient tolerated treatment well;No increased pain;Patient limited by fatigue    Behavior During Therapy [facility] for tasks assessed/performed                           Past Medical History:  Diagnosis Date   Allergy    Anemia    Bell's palsy    Diabetes mellitus without complication ([place])    diet controlled   Hypertension    Hypothyroidism    Kidney stones    Pseudotumor cerebri    Stroke [facility])    Past Surgical History:  Procedure Laterality Date   COLONOSCOPY WITH PROPOFOL N/A 03/30/2020   Procedure: COLONOSCOPY WITH PROPOFOL;  Surgeon: Lesly Rubenstein, MD;  Location: [facility] ENDOSCOPY;  Service: Endoscopy;  Laterality: N/A;   LOOP RECORDER INSERTION N/A 01/27/2018   Procedure: LOOP RECORDER INSERTION;  Surgeon: [name], MD;  Location: [place] [facility];  Service: Cardiovascular;  Laterality: N/A;   LUMBAR PUNCTURE     as child   NO PAST SURGERIES     TEE WITHOUT CARDIOVERSION N/A 01/07/2018   Procedure: TRANSESOPHAGEAL ECHOCARDIOGRAM (TEE);  Surgeon: Minna Merritts, MD;  Location: [facility] ORS;  Service: Cardiovascular;  Laterality: N/A;   Patient Active Problem List   Diagnosis Date Noted   Unsteady gait 10/05/2022   Hydronephrosis, left 05/04/2022   Type II diabetes mellitus with renal manifestations  ([place]) 05/04/2022   Leukocytosis 05/04/2022   Pre-op evaluation 04/24/2022   Headache 07/07/2021   Hearing loss 07/07/2021   Open wound 01/25/2021   History of colon polyps 10/15/2020   Postoperative hemorrhage involving digestive system following digestive system procedure 09/19/2020   Acute cholecystitis without calculus 09/09/2020   Type 2 diabetes mellitus, with long-term current use of insulin ([place]) 09/09/2020   Cryptogenic stroke ([place]) 07/13/2020   History of loop recorder 07/13/2020   Weakness 06/18/2020   History of 2019 novel coronavirus disease (COVID-19) 05/20/2020   ESRD (end stage renal disease) ([place]) 04/04/2020   Pneumonia due to COVID-19 virus 04/03/2020   AKI (acute kidney injury) ([place]) 04/03/2020   Elevated troponin 04/03/2020   Acquired trigger finger 06/08/2019   Lymphedema 06/08/2019   Anemia 04/17/2019   Swelling of left lower extremity 01/10/2019   Facial droop 04/09/2018   Daytime somnolence 03/30/2018   Carotid artery disease ([place]) 02/03/2018   Intracranial vascular stenosis 09/12/2017   Cough 01/20/2017   Bell's palsy 11/10/2016   History of CVA (cerebrovascular accident) 11/10/2016   Benign localized hyperplasia of prostate with urinary obstruction 10/27/2016   History of nephrolithiasis 10/27/2016   TIA (transient ischemic attack) 10/20/2016   Near syncope 06/23/2016   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   Neuropathy 08/06/2015  Health care maintenance 08/06/2015   Essential hypertension 08/06/2015   Heme positive stool 10/10/2013   Hypothyroidism 10/10/2013   Microalbuminuria 10/10/2013   Hyperlipidemia 10/10/2013   B12 deficiency 10/10/2013   Diabetes ([place]) 07/04/2013   Environmental allergies 07/04/2013   ONSET DATE: 2-3 years  REFERRING DIAG: Neuropathy  THERAPY DIAG:  Difficulty in walking, not elsewhere classified  Muscle weakness (generalized)  Unsteadiness on feet  Rationale for Evaluation and Treatment Rehabilitation  SUBJECTIVE:                                                                                                                                                                                              SUBJECTIVE STATEMENT: Patient reports feeling a little weaker today.   Pt accompanied by: self  PERTINENT HISTORY: Patient presents to physical therapy for neuropathy. He underwent banding of his L UE fistula on 05/13/22 and scheduled for additional banding on 06/07/22. 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 Patient reports his balance is very unsteady due to neuropathy in feet, feels weak in LE's.   PAIN:  Are you having pain? Yes: NPRS scale: 0/10 Pain location: L foot Pain description: sharp  Aggravating factors: worse at night Relieving factors: lyrica   PRECAUTIONS: Fall  WEIGHT BEARING RESTRICTIONS Yes no lifting >5 lb in arm   FALLS: Has patient fallen in last 6 months? No  LIVING ENVIRONMENT: Lives with: lives alone Lives in: House/apartment Stairs: Yes: Internal: flight steps; on right going up Has following equipment at home: Single point cane and Walker - 2 wheeled  PLOF: Independent  PATIENT GOALS to be more steady and walk as normally as possible.   OBJECTIVE:   LOWER EXTREMITY MMT:    MMT Right Eval Left Eval  Hip flexion 4- 3+  Hip extension    Hip abduction 4- 4+  Hip adduction 4- 3+  Knee flexion 4 4-  Knee extension 4 4  Ankle dorsiflexion 2+ 2+  Ankle plantarflexion 3+ 3+  (Blank rows = not tested)  FUNCTIONAL TESTs:  5 times sit to stand: 19.6 seconds with walking stick; one LOB 10 meter walk test: 12 seconds Berg Balance Scale: 33/56  PATIENT SURVEYS:  ABC scale 67% FOTO 53%  TODAY'S TREATMENT:     TREATMENT:  Assess goals: see below     TherEx:     Standing:  Squat with UE support 10x   Seated   Adduction ball squeeze 15x; squeeze 3 seconds Adduction ball squeeze with  heel raise 20x  Seated LAQ with adduction ball squeeze between feet 10x  Lateral step over hedgehog 10x each LE  PATIENT EDUCATION: Education details: HEP, goals, POC Person educated: Patient Education method: Explanation, Demonstration, Tactile cues, Verbal cues, and Handouts Education comprehension: verbalized understanding, returned demonstration, verbal cues required, and tactile cues required  HOME EXERCISE PROGRAM: Access Code: [email or web address] URL: [email or web address] Date: 06/03/2022 Prepared by: Janna Arch  Exercises - Seated Heel Toe Raises  - 1 x daily - 7 x weekly - 2 sets - 10 reps - 5 hold - Standing Tandem Balance with Counter Support  - 1 x daily - 7 x weekly - 2 sets - 2 reps - 30 hold - Standing March with Counter Support  - 1 x daily - 7 x weekly - 2 sets - 10 reps - 5 hold   GOALS: Goals reviewed with patient? Yes   SHORT TERM GOALS: Target date: 07/01/2022  Patient will be independent in home exercise program to improve strength/mobility for better functional independence with ADLs. Baseline:10/9; HEP given 2/21: HEP compliant  Goal status: MET   LONG TERM GOALS: Target date: 11/13/2022  Patient will increase FOTO score to equal to or greater than   63%  to demonstrate statistically significant improvement in mobility and quality of life.  Baseline: 10/9: 53% 11/29: 58% 12/27: 51% 1/17: 56% 2/21: 53% Goal status: IN PROGRESS  2.  Patient (< 3 years old) will complete five times sit to stand test in < 10 seconds without UE support indicating an increased LE strength and improved balance. Baseline: 10/9: 19.6 seconds with walking stick; one LOB 11/29: 20.56 seconds 12/27: 13.4 seconds hands on knees 1/17: 13 seconds hands on knees  2/21: 14 seconds no hands Goal status: IN PROGRESS  3.  Patient will increase Berg  Balance score by > 45/56 to demonstrate decreased fall risk during functional activities. Baseline: 10/9: 33/56 11/29: 43/56 12/27: 44/56 1/17:  44/56 2/21: 46/56  Goal status: IN PROGRESS  4.  Patient will increase 10 meter walk test to >1.47ms as to improve gait speed for better community ambulation and to reduce fall risk. Baseline: 11/29: 1.19 m/s Goal status: MET  5.  Patient will increase ABC scale score >80% to demonstrate better functional mobility and better confidence with ADLs.  Baseline: 10/9: 67% 11/29: 75.6% 12/27: 78%  2/21: 78% Goal status: IN PROGRESS  6. Patient will tolerate 5 seconds of single leg stance without loss of balance to improve ability to get in and out of shower safely. Baseline: 11/29: unable to stand on single leg safely 12/27: 2 seconds 1/17: 3 seconds 2/21: 3 seconds  Goal status: IN PROGRESS   6. Patient will increase six minute walk test distance to >1000 for progression to community ambulator and improve gait ability Baseline: 12/27: 845 ft with with walking stick 1/17: [id number]/21: 930 ft  Goal status: partially met    ASSESSMENT:  CLINICAL IMPRESSION:  Patient does present with increased weakness today. He does have improved balance as can be seen in BERG test and significant progress with 6 minute walk test. Patient's condition has the potential to improve in response to therapy. Maximum improvement is yet to be obtained. The anticipated improvement is attainable and reasonable in a generally predictable time. Pt will continue to benefit from skilled therapy to address remaining deficits in order to improve overall QoL and return to PLOF.     OBJECTIVE IMPAIRMENTS Abnormal gait, decreased activity tolerance, decreased balance, decreased  coordination, decreased endurance, decreased mobility, difficulty walking, decreased strength, impaired flexibility, impaired sensation, improper body mechanics, and pain.   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   REHAB POTENTIAL: Good  CLINICAL DECISION MAKING: Evolving/moderate complexity  EVALUATION COMPLEXITY: Moderate  PLAN: PT FREQUENCY: 2x/week  PT DURATION: 12 weeks  PLANNED INTERVENTIONS: Therapeutic exercises, Therapeutic activity, Neuromuscular re-education, Balance training, Gait training, Patient/Family education, Self Care, Joint mobilization, Stair training, Vestibular training, Canalith repositioning, Visual/preceptual remediation/compensation, DME instructions, Dry Needling, Cognitive remediation, Spinal mobilization, Cryotherapy, Moist heat, Manual lymph drainage, Compression bandaging, Taping, Vasopneumatic device, Ultrasound, Manual therapy, and Re-evaluation  PLAN FOR NEXT SESSION: Continue to progress dynamic balance and dual tasking balance. Work on single leg balance progressions.     Janna Arch PT  Physical Therapist- [facility]  10/16/22, 12:48 PM

## 2022-10-16 ENCOUNTER — Ambulatory Visit: Payer: Medicare Other

## 2022-10-16 DIAGNOSIS — R262 Difficulty in walking, not elsewhere classified: Secondary | ICD-10-CM

## 2022-10-16 DIAGNOSIS — M6281 Muscle weakness (generalized): Secondary | ICD-10-CM

## 2022-10-16 DIAGNOSIS — R2681 Unsteadiness on feet: Secondary | ICD-10-CM

## 2022-10-17 NOTE — Therapy (Signed)
OUTPATIENT PHYSICAL THERAPY NEURO TREATMENT   Patient Name: Gregory Crane MRN: [id number] DOB:10-01-1970, 52 y.o., male Today's Date: 10/21/2022  PCP: [name] MD REFERRING PROVIDER: [name] MD  PT End of Session - 10/21/22 1016     Visit Number 31    Number of Visits 40    Date for PT Re-Evaluation 11/13/22    Authorization Type Medicare A&B; Medicaid; 09/11/22    Authorization Time Period 08/21/22-11/13/22    Progress Note Due on Visit 30    PT Start Time 1015    PT Stop Time 1059    PT Time Calculation (min) 44 min    Activity Tolerance Patient tolerated treatment well;No increased pain;Patient limited by fatigue    Behavior During Therapy [facility] for tasks assessed/performed                            Past Medical History:  Diagnosis Date   Allergy    Anemia    Bell's palsy    Diabetes mellitus without complication ([place])    diet controlled   Hypertension    Hypothyroidism    Kidney stones    Pseudotumor cerebri    Stroke [facility])    Past Surgical History:  Procedure Laterality Date   COLONOSCOPY WITH PROPOFOL N/A 03/30/2020   Procedure: COLONOSCOPY WITH PROPOFOL;  Surgeon: Lesly Rubenstein, MD;  Location: [facility] ENDOSCOPY;  Service: Endoscopy;  Laterality: N/A;   LOOP RECORDER INSERTION N/A 01/27/2018   Procedure: LOOP RECORDER INSERTION;  Surgeon: [name], MD;  Location: [place] CV LAB;  Service: Cardiovascular;  Laterality: N/A;   LUMBAR PUNCTURE     as child   NO PAST SURGERIES     TEE WITHOUT CARDIOVERSION N/A 01/07/2018   Procedure: TRANSESOPHAGEAL ECHOCARDIOGRAM (TEE);  Surgeon: Minna Merritts, MD;  Location: [facility] ORS;  Service: Cardiovascular;  Laterality: N/A;   Patient Active Problem List   Diagnosis Date Noted   Unsteady gait 10/05/2022   Hydronephrosis, left 05/04/2022   Type II diabetes mellitus with renal manifestations ([place]) 05/04/2022   Leukocytosis 05/04/2022   Pre-op evaluation 04/24/2022   Headache  07/07/2021   Hearing loss 07/07/2021   Open wound 01/25/2021   History of colon polyps 10/15/2020   Postoperative hemorrhage involving digestive system following digestive system procedure 09/19/2020   Acute cholecystitis without calculus 09/09/2020   Type 2 diabetes mellitus, with long-term current use of insulin ([place]) 09/09/2020   Cryptogenic stroke ([place]) 07/13/2020   History of loop recorder 07/13/2020   Weakness 06/18/2020   History of 2019 novel coronavirus disease (COVID-19) 05/20/2020   ESRD (end stage renal disease) ([place]) 04/04/2020   Pneumonia due to COVID-19 virus 04/03/2020   AKI (acute kidney injury) ([place]) 04/03/2020   Elevated troponin 04/03/2020   Acquired trigger finger 06/08/2019   Lymphedema 06/08/2019   Anemia 04/17/2019   Swelling of left lower extremity 01/10/2019   Facial droop 04/09/2018   Daytime somnolence 03/30/2018   Carotid artery disease ([place]) 02/03/2018   Intracranial vascular stenosis 09/12/2017   Cough 01/20/2017   Bell's palsy 11/10/2016   History of CVA (cerebrovascular accident) 11/10/2016   Benign localized hyperplasia of prostate with urinary obstruction 10/27/2016   History of nephrolithiasis 10/27/2016   TIA (transient ischemic attack) 10/20/2016   Near syncope 06/23/2016   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   Neuropathy 08/06/2015   Health care maintenance 08/06/2015   Essential hypertension 08/06/2015   Heme positive stool 10/10/2013  Hypothyroidism 10/10/2013   Microalbuminuria 10/10/2013   Hyperlipidemia 10/10/2013   B12 deficiency 10/10/2013   Diabetes ([place]) 07/04/2013   Environmental allergies 07/04/2013   ONSET DATE: 2-3 years  REFERRING DIAG: Neuropathy  THERAPY DIAG:  Difficulty in walking, not elsewhere classified  Muscle weakness (generalized)  Unsteadiness on feet  Abnormality of gait and mobility  Rationale for Evaluation and Treatment Rehabilitation  SUBJECTIVE:                                                                                                                                                                                              SUBJECTIVE STATEMENT: Patient reports his blood pressure reached a critical low at dialysis. Had to receive saline. Is feeling better now. .   Pt accompanied by: self  PERTINENT HISTORY: Patient presents to physical therapy for neuropathy. He underwent banding of his L UE fistula on 05/13/22 and scheduled for additional banding on 06/07/22. 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 Patient reports his balance is very unsteady due to neuropathy in feet, feels weak in LE's.   PAIN:  Are you having pain? Yes: NPRS scale: 0/10 Pain location: L foot Pain description: sharp  Aggravating factors: worse at night Relieving factors: lyrica   PRECAUTIONS: Fall  WEIGHT BEARING RESTRICTIONS Yes no lifting >5 lb in arm   FALLS: Has patient fallen in last 6 months? No  LIVING ENVIRONMENT: Lives with: lives alone Lives in: House/apartment Stairs: Yes: Internal: flight steps; on right going up Has following equipment at home: Single point cane and Walker - 2 wheeled  PLOF: Independent  PATIENT GOALS to be more steady and walk as normally as possible.   OBJECTIVE:   LOWER EXTREMITY MMT:    MMT Right Eval Left Eval  Hip flexion 4- 3+  Hip extension    Hip abduction 4- 4+  Hip adduction 4- 3+  Knee flexion 4 4-  Knee extension 4 4  Ankle dorsiflexion 2+ 2+  Ankle plantarflexion 3+ 3+  (Blank rows = not tested)  FUNCTIONAL TESTs:  5 times sit to stand: 19.6 seconds with walking stick; one LOB 10 meter walk test: 12 seconds Berg Balance Scale: 33/56  PATIENT SURVEYS:  ABC scale 67% FOTO 53%  TODAY'S TREATMENT:     TREATMENT:   BP at start of session: seated  149/64 Standing: 130/61  TherEx:     Standing:  Squat with UE support 10x   5lb ankle weight:  -march 15x each LE  with UE support -hip abduction 15x each LE -hip extension 15x each LE  Seated  5lb ankle weight; -LAQ 15x each LE 3 second holds -march with arms crossed 15 each LE -20 heel raises  -Lateral step over hedgehog 10x each LE  GTB ER/IR 15x each LE Ankle circles 10x clockwise, 10x counterclockwise  GTB adduction 20x each LE  Heel toe rocker on half foam roller 20x   PATIENT EDUCATION: Education details: HEP, goals, POC Person educated: Patient Education method: Explanation, Demonstration, Tactile cues, Verbal cues, and Handouts Education comprehension: verbalized understanding, returned demonstration, verbal cues required, and tactile cues required  HOME EXERCISE PROGRAM: Access Code: [email or web address] URL: [email or web address] Date: 06/03/2022 Prepared by: Janna Arch  Exercises - Seated Heel Toe Raises  - 1 x daily - 7 x weekly - 2 sets - 10 reps - 5 hold - Standing Tandem Balance with Counter Support  - 1 x daily - 7 x weekly - 2 sets - 2 reps - 30 hold - Standing March with Counter Support  - 1 x daily - 7 x weekly - 2 sets - 10 reps - 5 hold   GOALS: Goals reviewed with patient? Yes   SHORT TERM GOALS: Target date: 07/01/2022  Patient will be independent in home exercise program to improve strength/mobility for better functional independence with ADLs. Baseline:10/9; HEP given 2/21: HEP compliant  Goal status: MET   LONG TERM GOALS: Target date: 11/13/2022  Patient will increase FOTO score to equal to or greater than   63%  to demonstrate statistically significant improvement in mobility and quality of life.  Baseline: 10/9: 53% 11/29: 58% 12/27: 51% 1/17: 56% 2/21: 53% Goal status: IN PROGRESS  2.  Patient (< 47 years old) will complete five times sit to stand test in < 10 seconds without UE support indicating an increased LE strength  and improved balance. Baseline: 10/9: 19.6 seconds with walking stick; one LOB 11/29: 20.56 seconds 12/27: 13.4 seconds hands on knees 1/17: 13 seconds hands on knees  2/21: 14 seconds no hands Goal status: IN PROGRESS  3.  Patient will increase Berg Balance score by > 45/56 to demonstrate decreased fall risk during functional activities. Baseline: 10/9: 33/56 11/29: 43/56 12/27: 44/56 1/17:  44/56 2/21: 46/56  Goal status: IN PROGRESS  4.  Patient will increase 10 meter walk test to >1.46ms as to improve gait speed for better community ambulation and to reduce fall risk. Baseline: 11/29: 1.19 m/s Goal status: MET  5.  Patient will increase ABC scale score >80% to demonstrate better functional mobility and better confidence with ADLs.  Baseline: 10/9: 67% 11/29: 75.6% 12/27: 78%  2/21: 78% Goal status: IN PROGRESS  6. Patient will tolerate 5 seconds of single leg stance without loss of balance to improve ability to get in and out of shower safely. Baseline: 11/29: unable to stand on single leg safely 12/27: 2 seconds 1/17: 3 seconds 2/21: 3 seconds  Goal status: IN PROGRESS   6. Patient will increase six minute walk test distance to >1000 for progression to community ambulator and improve gait ability Baseline: 12/27: 845 ft with with walking stick 1/17: [id number]/21: 930 ft  Goal status: partially met    ASSESSMENT:  CLINICAL IMPRESSION:  Patient has increased fatigue this session due to experience over the weekend. Has his first OT evaluation today after PT. He requires increased rest breaks.  He is able to perform LE strengthening in seated and standing with occasional rest breaks. Lateral  step overs are very challenging for patient. Pt will continue to benefit from skilled therapy to address remaining deficits in order to improve overall QoL and return to PLOF.     OBJECTIVE IMPAIRMENTS Abnormal gait, decreased activity tolerance, decreased balance, decreased coordination,  decreased endurance, decreased mobility, difficulty walking, decreased strength, impaired flexibility, impaired sensation, improper body mechanics, and pain.   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   REHAB POTENTIAL: Good  CLINICAL DECISION MAKING: Evolving/moderate complexity  EVALUATION COMPLEXITY: Moderate  PLAN: PT FREQUENCY: 2x/week  PT DURATION: 12 weeks  PLANNED INTERVENTIONS: Therapeutic exercises, Therapeutic activity, Neuromuscular re-education, Balance training, Gait training, Patient/Family education, Self Care, Joint mobilization, Stair training, Vestibular training, Canalith repositioning, Visual/preceptual remediation/compensation, DME instructions, Dry Needling, Cognitive remediation, Spinal mobilization, Cryotherapy, Moist heat, Manual lymph drainage, Compression bandaging, Taping, Vasopneumatic device, Ultrasound, Manual therapy, and Re-evaluation  PLAN FOR NEXT SESSION: Continue to progress dynamic balance and dual tasking balance. Work on single leg balance progressions.     Janna Arch PT  Physical Therapist- [facility]  10/21/22, 11:00 AM

## 2022-10-21 ENCOUNTER — Ambulatory Visit: Payer: Medicare Other | Admitting: Occupational Therapy

## 2022-10-21 ENCOUNTER — Ambulatory Visit: Payer: Medicare Other

## 2022-10-21 ENCOUNTER — Encounter: Payer: Self-pay | Admitting: Occupational Therapy

## 2022-10-21 DIAGNOSIS — R278 Other lack of coordination: Secondary | ICD-10-CM

## 2022-10-21 DIAGNOSIS — R262 Difficulty in walking, not elsewhere classified: Secondary | ICD-10-CM

## 2022-10-21 DIAGNOSIS — M6281 Muscle weakness (generalized): Secondary | ICD-10-CM

## 2022-10-21 DIAGNOSIS — R2681 Unsteadiness on feet: Secondary | ICD-10-CM

## 2022-10-21 DIAGNOSIS — R269 Unspecified abnormalities of gait and mobility: Secondary | ICD-10-CM

## 2022-10-21 NOTE — Therapy (Addendum)
OUTPATIENT OCCUPATIONAL THERAPY NEURO EVALUATION  Patient Name: Gregory Crane MRN: [id number] DOB:[birth year], 52 y.o., male Today's Date: 10/21/2022  PCP: [name] REFERRING PROVIDER: [name]   END OF SESSION:  OT End of Session - 10/24/22 2202     Visit Number 1    Number of Visits 24    Date for OT Re-Evaluation 01/13/23    OT Start Time 13    OT Stop Time 1156    OT Time Calculation (min) 56 min             Past Medical History:  Diagnosis Date   Allergy    Anemia    Bell's palsy    Diabetes mellitus without complication ([place])    diet controlled   Hypertension    Hypothyroidism    Kidney stones    Pseudotumor cerebri    Stroke [facility])    Past Surgical History:  Procedure Laterality Date   COLONOSCOPY WITH PROPOFOL N/A 03/30/2020   Procedure: COLONOSCOPY WITH PROPOFOL;  Surgeon: Lesly Rubenstein, MD;  Location: [facility] ENDOSCOPY;  Service: Endoscopy;  Laterality: N/A;   LOOP RECORDER INSERTION N/A 01/27/2018   Procedure: LOOP RECORDER INSERTION;  Surgeon: [name], MD;  Location: [facility];  Service: Cardiovascular;  Laterality: N/A;   LUMBAR PUNCTURE     as child   NO PAST SURGERIES     TEE WITHOUT CARDIOVERSION N/A 01/07/2018   Procedure: TRANSESOPHAGEAL ECHOCARDIOGRAM (TEE);  Surgeon: Minna Merritts, MD;  Location: [facility] ORS;  Service: Cardiovascular;  Laterality: N/A;   Patient Active Problem List   Diagnosis Date Noted   Unsteady gait 10/05/2022   Hydronephrosis, left 05/04/2022   Type II diabetes mellitus with renal manifestations ([place]) 05/04/2022   Leukocytosis 05/04/2022   Pre-op evaluation 04/24/2022   Headache 07/07/2021   Hearing loss 07/07/2021   Open wound 01/25/2021   History of colon polyps 10/15/2020   Postoperative hemorrhage involving digestive system following digestive system procedure 09/19/2020   Acute cholecystitis without calculus 09/09/2020   Type 2 diabetes mellitus, with long-term current use  of insulin ([place]) 09/09/2020   Cryptogenic stroke ([place]) 07/13/2020   History of loop recorder 07/13/2020   Weakness 06/18/2020   History of 2019 novel coronavirus disease (COVID-19) 05/20/2020   ESRD (end stage renal disease) ([place]) 04/04/2020   Pneumonia due to COVID-19 virus 04/03/2020   AKI (acute kidney injury) ([place]) 04/03/2020   Elevated troponin 04/03/2020   Acquired trigger finger 06/08/2019   Lymphedema 06/08/2019   Anemia 04/17/2019   Swelling of left lower extremity 01/10/2019   Facial droop 04/09/2018   Daytime somnolence 03/30/2018   Carotid artery disease ([place]) 02/03/2018   Intracranial vascular stenosis 09/12/2017   Cough 01/20/2017   Bell's palsy 11/10/2016   History of CVA (cerebrovascular accident) 11/10/2016   Benign localized hyperplasia of prostate with urinary obstruction 10/27/2016   History of nephrolithiasis 10/27/2016   TIA (transient ischemic attack) 10/20/2016   Near syncope 06/23/2016   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   Neuropathy 08/06/2015   Health care maintenance 08/06/2015   Essential hypertension 08/06/2015   Heme positive stool 10/10/2013   Hypothyroidism 10/10/2013   Microalbuminuria 10/10/2013   Hyperlipidemia 10/10/2013   B12 deficiency 10/10/2013   Diabetes ([place]) 07/04/2013   Environmental allergies 07/04/2013    ONSET DATE: May 2023  REFERRING DIAG: CVA  THERAPY DIAG:  Muscle weakness (generalized)  Other lack of coordination  Unsteadiness on feet  Rationale for Evaluation and Treatment: Rehabilitation  SUBJECTIVE:  SUBJECTIVE STATEMENT: Pt reports he had a series of strokes several years ago, over time he has decreased fine motor and strength in left UE.  He reports several surgeries in his left arm and may have some nerve damage from that.  Complications from dialysis with fistula.  He cannot do heavier strength training in the left UE., limited to 5-10 pounds.  He has been working with PT on his balance, was walking with a  cane before and now walking without.  Symptoms worsened May of 2023. 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    PRECAUTIONS: Fall and Other: No strengthening exercises greater than 5-10 pounds in left arm.   Dialysis Tues, Th, Sat.  WEIGHT BEARING RESTRICTIONS: No  PAIN:  Are you having pain? No  FALLS: Has patient fallen in last 6 months? No  LIVING ENVIRONMENT: Lives with: alone Lives in: House/apartment Stairs: Yes: Internal: 15 steps; on right going up Has following equipment at home: Single point cane and Walker - 2 wheeled  PLOF: Independent  PATIENT GOALS: Pt reports he wants to have better use of his left hand, be able to do buttons and shoes.   OBJECTIVE:   HAND DOMINANCE: Right  ADLs: Overall ADLs: modified independent with most tasks Transfers/ambulation related to ADLs: Now walking without an assistive device, used cane in the past.  Eating: Independent with self feeding, difficulty with cutting food, requires increased focus and effort. Grooming: Independent UB Dressing: Difficulty with buttons, uses pullovers most of the time  LB Dressing: Difficulty with tying shoes Toileting: Independent Bathing: Independent Tub Shower transfers: Independent Equipment: none  IADLs: Shopping: independent,  uses a cart to push in the store Light housekeeping: modified independent Meal Prep: difficulty with tasks requiring use of left hand, bilateral hand use.   Community mobility: Ambulates without use of assistive device, able to drive Medication management:  Independent Financial management: Independent Handwriting: 90% legible  MOBILITY STATUS: Needs Assist: Pt has progressed with PT, used to use a cane but ambulating without an assistive device but still has balance difficulties.    POSTURE COMMENTS:    Sitting balance: Sits without UE support up to 30 sec  ACTIVITY TOLERANCE: Activity tolerance: limited activity tolerance  FUNCTIONAL OUTCOME MEASURES: FOTO: 53  UPPER EXTREMITY ROM:    Active ROM Right eval Left eval  Shoulder flexion [facility]  Shoulder abduction [facility]  Shoulder adduction [facility]  Shoulder extension [facility]  Shoulder internal rotation [facility]  Shoulder external rotation [facility]  Elbow flexion [facility] WFL  Elbow extension [facility]  Wrist flexion [facility] WFL  Wrist extension [facility] WFL  Wrist ulnar deviation [facility] WFL  Wrist radial deviation WFL WFL  Wrist pronation [facility] WFL  Wrist supination WFL WFL  (Blank rows = not tested)  UPPER EXTREMITY MMT:     MMT Right eval Left eval  Shoulder flexion 5/5 3+/5  Shoulder abduction 5/5 3+/5  Shoulder adduction    Shoulder extension    Shoulder internal rotation    Shoulder external rotation    Middle trapezius    Lower trapezius    Elbow flexion 5/5  3+/5  Elbow extension 5/5 3+/5  Wrist flexion 5/5 3+/5  Wrist extension 5/5 3+/5  Wrist ulnar deviation 5/5 3+/5  Wrist radial deviation 5/5 3+/5  Wrist pronation 5/5 3+/5  Wrist supination 5/5 3+/5  (Blank rows = not tested)  Opposition of thumb to all digits on right, on left hand opposition to ring finger but cannot demonstrate to small finger.   HAND FUNCTION: Grip strength: Right: 44 lbs; Left: 35 lbs, Lateral pinch: Right: 17 lbs, Left: 16 lbs, 3 point pinch: Right: 14 lbs, Left: 0 lbs, and Tip pinch: Right 2 lbs, Left: 0 lbs  COORDINATION: 9 Hole Peg test: Right: 36 sec; Left: 56 sec  SENSATION: Numbness in the ring and small finger in left hand.    EDEMA: None  COGNITION: Overall cognitive status:  Within functional limits for tasks assessed  VISION: Subjective report: Pt reports he cataract surgery in Oct 2023, in both eyes.   Baseline vision: Wears glasses for reading only Visual history: cataracts and Seeing retina specialist for bleeding in the eye and has injections every few months.  VISION ASSESSMENT: WFL   PERCEPTION: WFL  PRAXIS: WFL  OBSERVATIONS: Difficulty with hearing out of left ear, no hearing aids.     TODAY'S TREATMENT:                                                                                                                              DATE: 10/21/2022   PATIENT EDUCATION: Education details: POC, goals, role of OT  Person educated: Patient Education method: Explanation Education comprehension: verbalized understanding  HOME EXERCISE PROGRAM: Will provide HEP in 1-2 visits   GOALS: Goals reviewed with patient? Yes  SHORT TERM GOALS: Target date:  11/18/2022   Pt will demonstrate ability to hold and utilize utensils for cutting meat with modified independence. Baseline: difficulty with cutting, stabilizing item with fork for cutting. Goal status: INITIAL  2.  Pt will demonstrate ability to obtain keys from left pocket without difficult and without dropping.   Baseline: pt struggles with reaching into left pocket to get keys.   Goal status: INITIAL   LONG TERM GOALS: Target date: 01/13/2023   Pt will demonstrate HEP with modified independence.   Baseline: no current program for left UE.  Goal status: INITIAL  2.  Pt will improve left UE coordination by 10 secs on 9 hole peg test to demonstrate shoe tying with modified independence.  Baseline: currently wearing slip on shoes and unable to tie shoes, left 9 hole peg test in 56 sec at eval  Goal status: INITIAL  3.  Pt will demonstrate improved coordination in left hand to demonstrate buttoning buttons on clothing with modified independence.  Baseline: difficulty with buttons and has  been wearing more pullovers to avoid challenge of buttons. Goal status: INITIAL  4.  Pt will demonstrate improvement in left grip strength by 5# to open jars and containers with modified independence and greater  ease.   Baseline: Difficulty with opening jars and containers with use of left hand at eval.  Goal status: INITIAL  5.  Pt will improve left UE strength by 1 mm grade to hold and hand wash dishes with modified independence.  Baseline: difficulty with being able to hand wash dishes at eval, difficulty stabilizing dish. Goal status: INITIAL  6.  Pt will demonstrate FOTO score of 59 or greater to show a clinically relevant change in LUE to impact greater independence with daily ADL and IADL tasks at home and in the community.   Baseline:  Goal status: INITIAL  ASSESSMENT:  CLINICAL IMPRESSION: Patient is a 52 y.o. male who was seen today for occupational therapy evaluation for bilateral UE weakness (left greater than right) and decreased functional use.  Pt presents with muscle weakness, decreased coordination, decreased functional balance, decreased manipulation skills of left hand and decreased ability to perform self care and IADL tasks.  Pt would benefit from skilled OT services to maximize safety and independence in necessary daily tasks.  Marland Kitchen   PERFORMANCE DEFICITS: in functional skills including ADLs, IADLs, coordination, dexterity, sensation, tone, ROM, strength, flexibility, Fine motor control, balance, endurance, decreased knowledge of use of DME, and UE functional use, cognitive skills including, and psychosocial skills including environmental adaptation, habits, and routines and behaviors.   IMPAIRMENTS: are limiting patient from ADLs, IADLs, and social participation.   CO-MORBIDITIES: has co-morbidities such as CAD, ESRD with dialysis, deafness in right ear, diabetic retinopathy, HTN, neuropathy  that affects occupational performance. Patient will benefit from skilled OT to  address above impairments and improve overall function.  MODIFICATION OR ASSISTANCE TO COMPLETE EVALUATION: Min-Moderate modification of tasks or assist with assess necessary to complete an evaluation.  OT OCCUPATIONAL PROFILE AND HISTORY: Comprehensive assessment: Review of records and extensive additional review of physical, cognitive, psychosocial history related to current functional performance.  CLINICAL DECISION MAKING: Moderate - several treatment options, min-mod task modification necessary  REHAB POTENTIAL: Good  EVALUATION COMPLEXITY: Moderate    PLAN:  OT FREQUENCY: 2x/week  OT DURATION: 12 weeks  PLANNED INTERVENTIONS: self care/ADL training, therapeutic exercise, therapeutic activity, neuromuscular re-education, manual therapy, balance training, paraffin, moist heat, contrast bath, patient/family education, and DME and/or AE instructions  RECOMMENDED OTHER SERVICES: Pt is being seen by PT currently.   CONSULTED AND AGREED WITH PLAN OF CARE: Patient  PLAN FOR NEXT SESSION: Initiate HEP for grip, pinch and coordination skills.   Essam Lowdermilk Oneita Jolly, OTR/L, CLT  Jerney Baksh, OT 10/24/2022, 10:03 PM

## 2022-10-22 NOTE — Therapy (Signed)
OUTPATIENT PHYSICAL THERAPY NEURO TREATMENT   Patient Name: Gregory Crane MRN: [id number] DOB:September 10, 1970, 52 y.o., male Today's Date: 10/23/2022  PCP: [name] MD REFERRING PROVIDER: [name] MD  PT End of Session - 10/23/22 1015     Visit Number 32    Number of Visits 40    Date for PT Re-Evaluation 11/13/22    Authorization Type Medicare A&B; Medicaid; 09/11/22    Authorization Time Period 08/21/22-11/13/22    Progress Note Due on Visit 30    PT Start Time 1015    PT Stop Time 1058    PT Time Calculation (min) 43 min    Activity Tolerance Patient tolerated treatment well;No increased pain;Patient limited by fatigue    Behavior During Therapy [facility] for tasks assessed/performed                             Past Medical History:  Diagnosis Date   Allergy    Anemia    Bell's palsy    Diabetes mellitus without complication ([place])    diet controlled   Hypertension    Hypothyroidism    Kidney stones    Pseudotumor cerebri    Stroke [facility])    Past Surgical History:  Procedure Laterality Date   COLONOSCOPY WITH PROPOFOL N/A 03/30/2020   Procedure: COLONOSCOPY WITH PROPOFOL;  Surgeon: Lesly Rubenstein, MD;  Location: [facility] ENDOSCOPY;  Service: Endoscopy;  Laterality: N/A;   LOOP RECORDER INSERTION N/A 01/27/2018   Procedure: LOOP RECORDER INSERTION;  Surgeon: [name], MD;  Location: [facility];  Service: Cardiovascular;  Laterality: N/A;   LUMBAR PUNCTURE     as child   NO PAST SURGERIES     TEE WITHOUT CARDIOVERSION N/A 01/07/2018   Procedure: TRANSESOPHAGEAL ECHOCARDIOGRAM (TEE);  Surgeon: Minna Merritts, MD;  Location: [facility] ORS;  Service: Cardiovascular;  Laterality: N/A;   Patient Active Problem List   Diagnosis Date Noted   Unsteady gait 10/05/2022   Hydronephrosis, left 05/04/2022   Type II diabetes mellitus with renal manifestations ([place]) 05/04/2022   Leukocytosis 05/04/2022   Pre-op evaluation 04/24/2022    Headache 07/07/2021   Hearing loss 07/07/2021   Open wound 01/25/2021   History of colon polyps 10/15/2020   Postoperative hemorrhage involving digestive system following digestive system procedure 09/19/2020   Acute cholecystitis without calculus 09/09/2020   Type 2 diabetes mellitus, with long-term current use of insulin ([place]) 09/09/2020   Cryptogenic stroke ([place]) 07/13/2020   History of loop recorder 07/13/2020   Weakness 06/18/2020   History of 2019 novel coronavirus disease (COVID-19) 05/20/2020   ESRD (end stage renal disease) ([place]) 04/04/2020   Pneumonia due to COVID-19 virus 04/03/2020   AKI (acute kidney injury) ([place]) 04/03/2020   Elevated troponin 04/03/2020   Acquired trigger finger 06/08/2019   Lymphedema 06/08/2019   Anemia 04/17/2019   Swelling of left lower extremity 01/10/2019   Facial droop 04/09/2018   Daytime somnolence 03/30/2018   Carotid artery disease ([place]) 02/03/2018   Intracranial vascular stenosis 09/12/2017   Cough 01/20/2017   Bell's palsy 11/10/2016   History of CVA (cerebrovascular accident) 11/10/2016   Benign localized hyperplasia of prostate with urinary obstruction 10/27/2016   History of nephrolithiasis 10/27/2016   TIA (transient ischemic attack) 10/20/2016   Near syncope 06/23/2016   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   Neuropathy 08/06/2015   Health care maintenance 08/06/2015   Essential hypertension 08/06/2015   Heme positive stool 10/10/2013  Hypothyroidism 10/10/2013   Microalbuminuria 10/10/2013   Hyperlipidemia 10/10/2013   B12 deficiency 10/10/2013   Diabetes ([place]) 07/04/2013   Environmental allergies 07/04/2013   ONSET DATE: 2-3 years  REFERRING DIAG: Neuropathy  THERAPY DIAG:  Difficulty in walking, not elsewhere classified  Muscle weakness (generalized)  Unsteadiness on feet  Rationale for Evaluation and Treatment Rehabilitation  SUBJECTIVE:                                                                                                                                                                                              SUBJECTIVE STATEMENT: Patient reports he is tired, went to dialysis yesterday and didn't sleep well. Patient is going to [place] on Monday to get catheter out.    Pt accompanied by: self  PERTINENT HISTORY: Patient presents to physical therapy for neuropathy. He underwent banding of his L UE fistula on 05/13/22 and scheduled for additional banding on 06/07/22. 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 Patient reports his balance is very unsteady due to neuropathy in feet, feels weak in LE's.   PAIN:  Are you having pain? Yes: NPRS scale: 0/10 Pain location: L foot Pain description: sharp  Aggravating factors: worse at night Relieving factors: lyrica   PRECAUTIONS: Fall  WEIGHT BEARING RESTRICTIONS Yes no lifting >5 lb in arm   FALLS: Has patient fallen in last 6 months? No  LIVING ENVIRONMENT: Lives with: lives alone Lives in: House/apartment Stairs: Yes: Internal: flight steps; on right going up Has following equipment at home: Single point cane and Walker - 2 wheeled  PLOF: Independent  PATIENT GOALS to be more steady and walk as normally as possible.   OBJECTIVE:   LOWER EXTREMITY MMT:    MMT Right Eval Left Eval  Hip flexion 4- 3+  Hip extension    Hip abduction 4- 4+  Hip adduction 4- 3+  Knee flexion 4 4-  Knee extension 4 4  Ankle dorsiflexion 2+ 2+  Ankle plantarflexion 3+ 3+  (Blank rows = not tested)  FUNCTIONAL TESTs:  5 times sit to stand: 19.6 seconds with walking stick; one LOB 10 meter walk test: 12 seconds Berg Balance Scale: 33/56  PATIENT SURVEYS:  ABC scale 67% FOTO 53%  TODAY'S TREATMENT:     TREATMENT:     TherEx:    Superset: 3x  Exercise  1: 4lb ankle weights walking 160 ft ;  Exercise 2 10x STS   Speed ladder: One foot each square 6x length  Lateral step two feet per square 2x length   Seated  4lb ankle weight; -LAQ 15x each LE 3 second holds -march with arms crossed 15 each LE -20 heel/toe  raises  -Lateral step over hedgehog 15x each LE  Black TB abduction 15x  Black TB single leg abduction 15x each LE Black TB hamstring curl 15x each LE  10 clockwise circles 10x; counterclockwise 10x   PATIENT EDUCATION: Education details: HEP, goals, POC Person educated: Patient Education method: Explanation, Demonstration, Tactile cues, Verbal cues, and Handouts Education comprehension: verbalized understanding, returned demonstration, verbal cues required, and tactile cues required  HOME EXERCISE PROGRAM: Access Code: [email or web address] URL: [email or web address] Date: 06/03/2022 Prepared by: Janna Arch  Exercises - Seated Heel Toe Raises  - 1 x daily - 7 x weekly - 2 sets - 10 reps - 5 hold - Standing Tandem Balance with Counter Support  - 1 x daily - 7 x weekly - 2 sets - 2 reps - 30 hold - Standing March with Counter Support  - 1 x daily - 7 x weekly - 2 sets - 10 reps - 5 hold   GOALS: Goals reviewed with patient? Yes   SHORT TERM GOALS: Target date: 07/01/2022  Patient will be independent in home exercise program to improve strength/mobility for better functional independence with ADLs. Baseline:10/9; HEP given 2/21: HEP compliant  Goal status: MET   LONG TERM GOALS: Target date: 11/13/2022  Patient will increase FOTO score to equal to or greater than   63%  to demonstrate statistically significant improvement in mobility and quality of life.  Baseline: 10/9: 53% 11/29: 58% 12/27: 51% 1/17: 56% 2/21: 53% Goal status: IN PROGRESS  2.  Patient (< 76 years old) will complete five times sit to stand test in < 10 seconds without UE support indicating an increased LE strength and improved  balance. Baseline: 10/9: 19.6 seconds with walking stick; one LOB 11/29: 20.56 seconds 12/27: 13.4 seconds hands on knees 1/17: 13 seconds hands on knees  2/21: 14 seconds no hands Goal status: IN PROGRESS  3.  Patient will increase Berg Balance score by > 45/56 to demonstrate decreased fall risk during functional activities. Baseline: 10/9: 33/56 11/29: 43/56 12/27: 44/56 1/17:  44/56 2/21: 46/56  Goal status: IN PROGRESS  4.  Patient will increase 10 meter walk test to >1.25ms as to improve gait speed for better community ambulation and to reduce fall risk. Baseline: 11/29: 1.19 m/s Goal status: MET  5.  Patient will increase ABC scale score >80% to demonstrate better functional mobility and better confidence with ADLs.  Baseline: 10/9: 67% 11/29: 75.6% 12/27: 78%  2/21: 78% Goal status: IN PROGRESS  6. Patient will tolerate 5 seconds of single leg stance without loss of balance to improve ability to get in and out of shower safely. Baseline: 11/29: unable to stand on single leg safely 12/27: 2 seconds 1/17: 3 seconds 2/21: 3 seconds  Goal status: IN PROGRESS   6. Patient will increase six minute walk test distance to >1000 for progression to community ambulator and improve gait ability Baseline: 12/27: 845 ft with with walking stick 1/17: [id number]: 930 ft  Goal status: partially met    ASSESSMENT:  CLINICAL IMPRESSION:  Patient presents with excellent motivation despite fatigue. He tolerates return to progressive mobility with increased duration of time performing superset. Prolonged ambulation with ankle weights is fatiguing but patient tolerates without LOB. Lateral stepping is more challenging for patient.Pt will continue to benefit from  skilled therapy to address remaining deficits in order to improve overall QoL and return to PLOF.     OBJECTIVE IMPAIRMENTS Abnormal gait, decreased activity tolerance, decreased balance, decreased coordination, decreased endurance, decreased  mobility, difficulty walking, decreased strength, impaired flexibility, impaired sensation, improper body mechanics, and pain.   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   REHAB POTENTIAL: Good  CLINICAL DECISION MAKING: Evolving/moderate complexity  EVALUATION COMPLEXITY: Moderate  PLAN: PT FREQUENCY: 2x/week  PT DURATION: 12 weeks  PLANNED INTERVENTIONS: Therapeutic exercises, Therapeutic activity, Neuromuscular re-education, Balance training, Gait training, Patient/Family education, Self Care, Joint mobilization, Stair training, Vestibular training, Canalith repositioning, Visual/preceptual remediation/compensation, DME instructions, Dry Needling, Cognitive remediation, Spinal mobilization, Cryotherapy, Moist heat, Manual lymph drainage, Compression bandaging, Taping, Vasopneumatic device, Ultrasound, Manual therapy, and Re-evaluation  PLAN FOR NEXT SESSION: Continue to progress dynamic balance and dual tasking balance. Work on single leg balance progressions.     Janna Arch PT  Physical Therapist- [facility]  10/23/22, 11:00 AM

## 2022-10-23 ENCOUNTER — Ambulatory Visit: Payer: Medicare Other | Admitting: Occupational Therapy

## 2022-10-23 ENCOUNTER — Ambulatory Visit: Payer: Medicare Other

## 2022-10-23 DIAGNOSIS — R2681 Unsteadiness on feet: Secondary | ICD-10-CM

## 2022-10-23 DIAGNOSIS — M6281 Muscle weakness (generalized): Secondary | ICD-10-CM

## 2022-10-23 DIAGNOSIS — R262 Difficulty in walking, not elsewhere classified: Secondary | ICD-10-CM

## 2022-10-23 DIAGNOSIS — R278 Other lack of coordination: Secondary | ICD-10-CM

## 2022-10-24 ENCOUNTER — Encounter: Payer: Self-pay | Admitting: Occupational Therapy

## 2022-10-25 ENCOUNTER — Ambulatory Visit: Payer: Medicaid Other | Admitting: Urology

## 2022-10-25 ENCOUNTER — Encounter: Payer: Self-pay | Admitting: Occupational Therapy

## 2022-10-25 ENCOUNTER — Other Ambulatory Visit: Payer: Self-pay | Admitting: Internal Medicine

## 2022-10-25 NOTE — Therapy (Signed)
OUTPATIENT OCCUPATIONAL THERAPY NEURO TREATMENT  Patient Name: Gregory Crane MRN: [id number] DOB:24-Jul-1971, 52 y.o., male Today's Date: 10/23/2022  PCP: [name] REFERRING PROVIDER: [name]   END OF SESSION:  OT End of Session - 10/24/22 2202     Visit Number 1    Number of Visits 24    Date for OT Re-Evaluation 01/13/23    OT Start Time 61    OT Stop Time 1156    OT Time Calculation (min) 56 min             Past Medical History:  Diagnosis Date   Allergy    Anemia    Bell's palsy    Diabetes mellitus without complication ([place])    diet controlled   Hypertension    Hypothyroidism    Kidney stones    Pseudotumor cerebri    Stroke [facility])    Past Surgical History:  Procedure Laterality Date   COLONOSCOPY WITH PROPOFOL N/A 03/30/2020   Procedure: COLONOSCOPY WITH PROPOFOL;  Surgeon: Lesly Rubenstein, MD;  Location: [facility] ENDOSCOPY;  Service: Endoscopy;  Laterality: N/A;   LOOP RECORDER INSERTION N/A 01/27/2018   Procedure: LOOP RECORDER INSERTION;  Surgeon: [name], MD;  Location: [place] CV LAB;  Service: Cardiovascular;  Laterality: N/A;   LUMBAR PUNCTURE     as child   NO PAST SURGERIES     TEE WITHOUT CARDIOVERSION N/A 01/07/2018   Procedure: TRANSESOPHAGEAL ECHOCARDIOGRAM (TEE);  Surgeon: Minna Merritts, MD;  Location: [facility] ORS;  Service: Cardiovascular;  Laterality: N/A;   Patient Active Problem List   Diagnosis Date Noted   Unsteady gait 10/05/2022   Hydronephrosis, left 05/04/2022   Type II diabetes mellitus with renal manifestations ([place]) 05/04/2022   Leukocytosis 05/04/2022   Pre-op evaluation 04/24/2022   Headache 07/07/2021   Hearing loss 07/07/2021   Open wound 01/25/2021   History of colon polyps 10/15/2020   Postoperative hemorrhage involving digestive system following digestive system procedure 09/19/2020   Acute cholecystitis without calculus 09/09/2020   Type 2 diabetes mellitus, with long-term current use of  insulin ([place]) 09/09/2020   Cryptogenic stroke ([place]) 07/13/2020   History of loop recorder 07/13/2020   Weakness 06/18/2020   History of 2019 novel coronavirus disease (COVID-19) 05/20/2020   ESRD (end stage renal disease) ([place]) 04/04/2020   Pneumonia due to COVID-19 virus 04/03/2020   AKI (acute kidney injury) ([place]) 04/03/2020   Elevated troponin 04/03/2020   Acquired trigger finger 06/08/2019   Lymphedema 06/08/2019   Anemia 04/17/2019   Swelling of left lower extremity 01/10/2019   Facial droop 04/09/2018   Daytime somnolence 03/30/2018   Carotid artery disease ([place]) 02/03/2018   Intracranial vascular stenosis 09/12/2017   Cough 01/20/2017   Bell's palsy 11/10/2016   History of CVA (cerebrovascular accident) 11/10/2016   Benign localized hyperplasia of prostate with urinary obstruction 10/27/2016   History of nephrolithiasis 10/27/2016   TIA (transient ischemic attack) 10/20/2016   Near syncope 06/23/2016   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   Neuropathy 08/06/2015   Health care maintenance 08/06/2015   Essential hypertension 08/06/2015   Heme positive stool 10/10/2013   Hypothyroidism 10/10/2013   Microalbuminuria 10/10/2013   Hyperlipidemia 10/10/2013   B12 deficiency 10/10/2013   Diabetes ([place]) 07/04/2013   Environmental allergies 07/04/2013    ONSET DATE: May 2023  REFERRING DIAG: CVA  THERAPY DIAG:  Muscle weakness (generalized)  Other lack of coordination  Unsteadiness on feet  Rationale for Evaluation and Treatment: Rehabilitation  SUBJECTIVE:  SUBJECTIVE STATEMENT: 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  Pt reports fatigue from PT session and reports he worked hard today.   Pt accompanied by: self  PERTINENT HISTORY: Patient with history of CVA, banding of fistula in left arm with worsening of LUE functional use over time since May 2023, he has been  seeing PT for balance deficits.  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    PRECAUTIONS: Fall and Other: No strengthening exercises greater than 5-10 pounds in left arm.   Dialysis Tues, Th, Sat.  WEIGHT BEARING RESTRICTIONS: No  PAIN:  Are you having pain? No  FALLS: Has patient fallen in last 6 months? No  LIVING ENVIRONMENT: Lives with: alone Lives in: House/apartment Stairs: Yes: Internal: 15 steps; on right going up Has following equipment at home: Single point cane and Walker - 2 wheeled  PLOF: Independent  PATIENT GOALS: Pt reports he wants to have better use of his left hand, be able to do buttons and shoes.   OBJECTIVE:   HAND DOMINANCE: Right  ADLs: Overall ADLs: modified independent with most tasks Transfers/ambulation related to ADLs: Now walking without an assistive device, used cane in the past.  Eating: Independent with self feeding, difficulty with cutting food, requires increased focus and effort. Grooming: Independent UB Dressing: Difficulty with buttons, uses pullovers most of the time  LB Dressing: Difficulty with tying shoes Toileting: Independent Bathing: Independent Tub Shower transfers: Independent Equipment: none  IADLs: Shopping: independent,  uses a cart to push in the store Light housekeeping: modified independent Meal Prep: difficulty with tasks requiring use of left hand, bilateral hand use.   Community mobility: Ambulates without use of assistive device, able to drive Medication management: Independent Financial management: Independent Handwriting: 90% legible  MOBILITY STATUS: Needs Assist: Pt has progressed with PT, used to use a cane but ambulating without an assistive device but still has balance difficulties.    POSTURE COMMENTS:    Sitting  balance: Sits without UE support up to 30 sec  ACTIVITY TOLERANCE: Activity tolerance: limited activity tolerance  FUNCTIONAL OUTCOME MEASURES: FOTO: 53  UPPER EXTREMITY ROM:    Active ROM Right eval Left eval  Shoulder flexion [facility]  Shoulder abduction [facility]  Shoulder adduction [facility]  Shoulder extension [facility]  Shoulder internal rotation [facility]  Shoulder external rotation [facility]  Elbow flexion [facility] WFL  Elbow extension [facility]  Wrist flexion [facility] WFL  Wrist extension [facility] WFL  Wrist ulnar deviation [facility] WFL  Wrist radial deviation [facility] WFL  Wrist pronation [facility] WFL  Wrist supination WFL WFL  (Blank rows = not tested)  UPPER EXTREMITY MMT:     MMT Right eval Left eval  Shoulder flexion 5/5 3+/5  Shoulder abduction 5/5 3+/5  Shoulder adduction    Shoulder extension    Shoulder internal rotation    Shoulder external rotation    Middle trapezius    Lower trapezius    Elbow flexion 5/5 3+/5  Elbow extension 5/5 3+/5  Wrist flexion 5/5 3+/5  Wrist extension 5/5 3+/5  Wrist ulnar deviation 5/5 3+/5  Wrist radial deviation 5/5 3+/5  Wrist pronation 5/5 3+/5  Wrist supination 5/5 3+/5  (Blank rows = not tested)  Opposition of thumb to all digits on right, on left hand opposition to ring finger but cannot demonstrate to small finger.   HAND FUNCTION: Grip strength: Right: 44 lbs; Left: 35 lbs, Lateral pinch: Right: 17 lbs, Left: 16 lbs, 3 point pinch: Right: 14 lbs, Left: 0 lbs, and Tip pinch: Right 2 lbs, Left: 0 lbs  COORDINATION: 9 Hole Peg test: Right: 36 sec; Left: 56 sec    TODAY'S TREATMENT:                                                                                                                              DATE: 10/21/2022   Therapeutic Exercises:  Pt seen this date for focus on grip strength with left hand with use of resistive hand gripper on 3rd setting 17.9# for 15 reps, 4th setting 23# for 15 reps, verbal and tactile cues for proper grip  on hand gripper and for sustained gripping patterns to pick up jumbo pegs with gripper and place into container.  Pt also seen for resistive pinch for lateral and 3 point with 5 levels of resistance.  Pt picking up and placing pins onto dowel presented in a variety of planes of motion to encourage increased reaching patterns.  Pt demonstrates difficulty with most resistive level (black pins) and unable to complete this date.    Neuromuscular Reeducation:  Pt seen this date for emphasis on manipulation of small to medium sized washers to pick up and place onto magnetic hooks in vertical plan of motion.  With removal of washers, pt instructed to utilize translatory skills of the hand to move items through the hand to palm and then using hand for storage. Manipulation of small pegs, 1/2 inch in size picking up from flat surface and placing into grid.  Pt dropping items frequently.  Cues for prehension patterns.   PATIENT EDUCATION: Education details: fine motor coordination, Advertising copywriter Person educated: Patient Education method: Explanation Education comprehension: verbalized understanding  HOME EXERCISE PROGRAM: Will provide HEP in 1-2 visits   GOALS: Goals reviewed with patient? Yes  SHORT TERM GOALS: Target date:  11/18/2022   Pt will demonstrate ability to hold and utilize utensils for cutting meat with modified independence. Baseline: difficulty with cutting, stabilizing item with fork for cutting. Goal status: INITIAL  2.  Pt will demonstrate ability to obtain keys from left pocket without difficult and without dropping.   Baseline: pt struggles with reaching into left pocket to get keys.   Goal status: INITIAL   LONG TERM GOALS: Target date: 01/13/2023   Pt will demonstrate HEP with modified independence.   Baseline: no current program for left UE.  Goal status: INITIAL  2.  Pt will improve left UE coordination by 10 secs on 9 hole peg test to demonstrate shoe tying  with modified independence.  Baseline: currently wearing slip on shoes and unable to tie shoes, left 9 hole peg test in 56 sec at eval  Goal status: INITIAL  3.  Pt will demonstrate improved coordination in left hand to demonstrate buttoning buttons on clothing with modified independence.  Baseline: difficulty with buttons and has been wearing more pullovers to avoid challenge of buttons. Goal status: INITIAL  4.  Pt will demonstrate improvement in left grip strength by 5# to open jars and containers with modified independence and greater ease.   Baseline: Difficulty with opening jars and containers with use of left hand at eval.  Goal status: INITIAL  5.  Pt will improve left UE strength by 1 mm grade to hold and hand wash dishes with modified independence.  Baseline: difficulty with being able to hand wash dishes at eval, difficulty stabilizing dish. Goal status: INITIAL  6.  Pt will demonstrate FOTO score of 59 or greater to show a clinically relevant change in LUE to impact greater independence with daily ADL and IADL tasks at home and in the community.   Baseline:  Goal status: INITIAL  ASSESSMENT:  CLINICAL IMPRESSION: Pt able to demonstrate gripping patterns with 3rd and 4th settings on resistive hand gripper this date.  Left hand fatigues quickly and requires short rest and stretching breaks during task.  Functional reaching in a variety of planes of motion with many tasks this date.  Difficulty with most resistive pinch pin and unable to perform black level of resistance with pins.  Pt demonstrates difficulty with manipulation tasks with left hand, dropping items frequently.  Continue to work towards goals in plan of care to improve coordination and manipulation skills of UEs.   PERFORMANCE DEFICITS: in functional skills including ADLs, IADLs, coordination, dexterity, sensation, tone, ROM, strength, flexibility, Fine motor control, balance, endurance, decreased knowledge of use of  DME, and UE functional use, cognitive skills including, and psychosocial skills including environmental adaptation, habits, and routines and behaviors.   IMPAIRMENTS: are limiting patient from ADLs, IADLs, and social participation.   CO-MORBIDITIES: has co-morbidities such as CAD, ESRD with dialysis, deafness in right ear, diabetic retinopathy, HTN, neuropathy  that affects occupational performance. Patient will benefit from skilled OT to address above impairments and improve overall function.  MODIFICATION OR ASSISTANCE TO COMPLETE EVALUATION: Min-Moderate modification of tasks or assist with assess necessary to complete an evaluation.  OT OCCUPATIONAL PROFILE AND HISTORY: Comprehensive assessment: Review of records and extensive additional review of physical, cognitive, psychosocial history related to current functional performance.  CLINICAL DECISION MAKING: Moderate - several treatment options, min-mod task modification necessary  REHAB POTENTIAL: Good  EVALUATION COMPLEXITY: Moderate    PLAN:  OT FREQUENCY: 2x/week  OT DURATION: 12 weeks  PLANNED INTERVENTIONS: self care/ADL training, therapeutic exercise, therapeutic activity, neuromuscular re-education, manual therapy, balance training, paraffin, moist heat, contrast bath, patient/family education, and DME and/or AE instructions  RECOMMENDED OTHER SERVICES: Pt is being seen by PT currently.   CONSULTED AND AGREED WITH PLAN OF CARE: Patient  PLAN FOR NEXT SESSION: Initiate HEP for grip, pinch and coordination skills.   Claribel Sachs Oneita Jolly, OTR/L, CLT  Ramadan Couey, OT 10/24/2022, 10:03 PM

## 2022-10-25 NOTE — Addendum Note (Signed)
Addended by: Garlon Hatchet T on: 10/25/2022 02:11 PM   Modules accepted: Orders

## 2022-10-28 ENCOUNTER — Ambulatory Visit: Payer: Medicare Other

## 2022-10-28 ENCOUNTER — Ambulatory Visit: Payer: Medicare Other | Admitting: Occupational Therapy

## 2022-10-29 NOTE — Therapy (Signed)
OUTPATIENT PHYSICAL THERAPY NEURO TREATMENT   Patient Name: Gregory Crane MRN: [id number] DOB:Jun 08, 1971, 52 y.o., male Today's Date: 10/30/2022  PCP: [name] MD REFERRING PROVIDER: [name] MD  PT End of Session - 10/30/22 1016     Visit Number 33    Number of Visits 40    Date for PT Re-Evaluation 11/13/22    Authorization Type Medicare A&B; Medicaid; 09/11/22    Authorization Time Period 08/21/22-11/13/22    Progress Note Due on Visit 30    PT Start Time 1015    PT Stop Time 1058    PT Time Calculation (min) 43 min    Activity Tolerance Patient tolerated treatment well;No increased pain;Patient limited by fatigue    Behavior During Therapy [facility] for tasks assessed/performed                              Past Medical History:  Diagnosis Date   Allergy    Anemia    Bell's palsy    Diabetes mellitus without complication ([place])    diet controlled   Hypertension    Hypothyroidism    Kidney stones    Pseudotumor cerebri    Stroke [facility])    Past Surgical History:  Procedure Laterality Date   COLONOSCOPY WITH PROPOFOL N/A 03/30/2020   Procedure: COLONOSCOPY WITH PROPOFOL;  Surgeon: Lesly Rubenstein, MD;  Location: [facility] ENDOSCOPY;  Service: Endoscopy;  Laterality: N/A;   LOOP RECORDER INSERTION N/A 01/27/2018   Procedure: LOOP RECORDER INSERTION;  Surgeon: [name], MD;  Location: [place] [facility] CV LAB;  Service: Cardiovascular;  Laterality: N/A;   LUMBAR PUNCTURE     as child   NO PAST SURGERIES     TEE WITHOUT CARDIOVERSION N/A 01/07/2018   Procedure: TRANSESOPHAGEAL ECHOCARDIOGRAM (TEE);  Surgeon: Minna Merritts, MD;  Location: [facility] ORS;  Service: Cardiovascular;  Laterality: N/A;   Patient Active Problem List   Diagnosis Date Noted   Unsteady gait 10/05/2022   Hydronephrosis, left 05/04/2022   Type II diabetes mellitus with renal manifestations ([place]) 05/04/2022   Leukocytosis 05/04/2022   Pre-op evaluation 04/24/2022    Headache 07/07/2021   Hearing loss 07/07/2021   Open wound 01/25/2021   History of colon polyps 10/15/2020   Postoperative hemorrhage involving digestive system following digestive system procedure 09/19/2020   Acute cholecystitis without calculus 09/09/2020   Type 2 diabetes mellitus, with long-term current use of insulin ([place]) 09/09/2020   Cryptogenic stroke ([place]) 07/13/2020   History of loop recorder 07/13/2020   Weakness 06/18/2020   History of 2019 novel coronavirus disease (COVID-19) 05/20/2020   ESRD (end stage renal disease) ([place]) 04/04/2020   Pneumonia due to COVID-19 virus 04/03/2020   AKI (acute kidney injury) ([place]) 04/03/2020   Elevated troponin 04/03/2020   Acquired trigger finger 06/08/2019   Lymphedema 06/08/2019   Anemia 04/17/2019   Swelling of left lower extremity 01/10/2019   Facial droop 04/09/2018   Daytime somnolence 03/30/2018   Carotid artery disease ([place]) 02/03/2018   Intracranial vascular stenosis 09/12/2017   Cough 01/20/2017   Bell's palsy 11/10/2016   History of CVA (cerebrovascular accident) 11/10/2016   Benign localized hyperplasia of prostate with urinary obstruction 10/27/2016   History of nephrolithiasis 10/27/2016   TIA (transient ischemic attack) 10/20/2016   Near syncope 06/23/2016   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   Neuropathy 08/06/2015   Health care maintenance 08/06/2015   Essential hypertension 08/06/2015   Heme positive stool  10/10/2013   Hypothyroidism 10/10/2013   Microalbuminuria 10/10/2013   Hyperlipidemia 10/10/2013   B12 deficiency 10/10/2013   Diabetes ([place]) 07/04/2013   Environmental allergies 07/04/2013   ONSET DATE: 2-3 years  REFERRING DIAG: Neuropathy  THERAPY DIAG:  Muscle weakness (generalized)  Unsteadiness on feet  Difficulty in walking, not elsewhere classified  Abnormality of gait and mobility  Rationale for Evaluation and Treatment Rehabilitation  SUBJECTIVE:                                                                                                                                                                                              SUBJECTIVE STATEMENT: Patient had his port removed Monday. Feels good, ready to work.    Pt accompanied by: self  PERTINENT HISTORY: Patient presents to physical therapy for neuropathy. He underwent banding of his L UE fistula on 05/13/22 and scheduled for additional banding on 06/07/22. 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 Patient reports his balance is very unsteady due to neuropathy in feet, feels weak in LE's.   PAIN:  Are you having pain? Yes: NPRS scale: 0/10 Pain location: L foot Pain description: sharp  Aggravating factors: worse at night Relieving factors: lyrica   PRECAUTIONS: Fall  WEIGHT BEARING RESTRICTIONS Yes no lifting >5 lb in arm   FALLS: Has patient fallen in last 6 months? No  LIVING ENVIRONMENT: Lives with: lives alone Lives in: House/apartment Stairs: Yes: Internal: flight steps; on right going up Has following equipment at home: Single point cane and Walker - 2 wheeled  PLOF: Independent  PATIENT GOALS to be more steady and walk as normally as possible.   OBJECTIVE:   LOWER EXTREMITY MMT:    MMT Right Eval Left Eval  Hip flexion 4- 3+  Hip extension    Hip abduction 4- 4+  Hip adduction 4- 3+  Knee flexion 4 4-  Knee extension 4 4  Ankle dorsiflexion 2+ 2+  Ankle plantarflexion 3+ 3+  (Blank rows = not tested)  FUNCTIONAL TESTs:  5 times sit to stand: 19.6 seconds with walking stick; one LOB 10 meter walk test: 12 seconds Berg Balance Scale: 33/56  PATIENT SURVEYS:  ABC scale 67% FOTO 53%  TODAY'S TREATMENT:     TREATMENT:     TherEx:    6" step with 4lb ankle weights: -toe taps 10x each  LE -step up/down 10x each LE -lateral step up/down 10x each LE -step down 10x each LE    4lb  ankle weights: walk 350 ft with CGA no rest breaks; good foot clearance  Seated 4lb ankle weight: -March 20x each LE -LAQ 20x each LE, alternating -alternating ER/IR 20x each LE   -heel raise 20x  Standing next to support surface Half foam roller df/pf 20x with UE support Lateral lunge with UE support 10x each side, LLE more challenging for patient Posterior lunge 10x each LE with UE support ; modified for depth  Seated: UE/LE alternating raises 15x each side Adduction ball squeeze 20x  Adduction squeeze with LAQ 10x    PATIENT EDUCATION: Education details: HEP, goals, POC Person educated: Patient Education method: Explanation, Demonstration, Tactile cues, Verbal cues, and Handouts Education comprehension: verbalized understanding, returned demonstration, verbal cues required, and tactile cues required  HOME EXERCISE PROGRAM: Access Code: [email or web address] URL: [email or web address] Date: 06/03/2022 Prepared by: Janna Arch  Exercises - Seated Heel Toe Raises  - 1 x daily - 7 x weekly - 2 sets - 10 reps - 5 hold - Standing Tandem Balance with Counter Support  - 1 x daily - 7 x weekly - 2 sets - 2 reps - 30 hold - Standing March with Counter Support  - 1 x daily - 7 x weekly - 2 sets - 10 reps - 5 hold   GOALS: Goals reviewed with patient? Yes   SHORT TERM GOALS: Target date: 07/01/2022  Patient will be independent in home exercise program to improve strength/mobility for better functional independence with ADLs. Baseline:10/9; HEP given 2/21: HEP compliant  Goal status: MET   LONG TERM GOALS: Target date: 11/13/2022  Patient will increase FOTO score to equal to or greater than   63%  to demonstrate statistically significant improvement in mobility and quality of life.  Baseline: 10/9: 53% 11/29: 58% 12/27: 51% 1/17: 56% 2/21: 53% Goal status: IN PROGRESS  2.   Patient (< 27 years old) will complete five times sit to stand test in < 10 seconds without UE support indicating an increased LE strength and improved balance. Baseline: 10/9: 19.6 seconds with walking stick; one LOB 11/29: 20.56 seconds 12/27: 13.4 seconds hands on knees 1/17: 13 seconds hands on knees  2/21: 14 seconds no hands Goal status: IN PROGRESS  3.  Patient will increase Berg Balance score by > 45/56 to demonstrate decreased fall risk during functional activities. Baseline: 10/9: 33/56 11/29: 43/56 12/27: 44/56 1/17:  44/56 2/21: 46/56  Goal status: IN PROGRESS  4.  Patient will increase 10 meter walk test to >1.52ms as to improve gait speed for better community ambulation and to reduce fall risk. Baseline: 11/29: 1.19 m/s Goal status: MET  5.  Patient will increase ABC scale score >80% to demonstrate better functional mobility and better confidence with ADLs.  Baseline: 10/9: 67% 11/29: 75.6% 12/27: 78%  2/21: 78% Goal status: IN PROGRESS  6. Patient will tolerate 5 seconds of single leg stance without loss of balance to improve ability to get in and out of shower safely. Baseline: 11/29: unable to stand on single leg safely 12/27: 2 seconds 1/17: 3 seconds 2/21: 3 seconds  Goal status: IN PROGRESS   6. Patient will increase six minute walk test distance to >1000 for progression to community ambulator and improve gait ability Baseline: 12/27: 845 ft with with walking stick 1/17: [id number]/21: 930 ft  Goal status: partially met    ASSESSMENT:  CLINICAL IMPRESSION: Patient tolerates progression of functional strengthening exercises well with minimal discomfort. He has excellent motivation throughout session. Increased rep's  with weight tolerated this session indicating improved strength and carryover between sessions. Patient is challenged with eccentric control on stairs this session.  LLE weakness is more present with lunges requiring UE support.  Pt will continue to benefit  from skilled therapy to address remaining deficits in order to improve overall QoL and return to PLOF.     OBJECTIVE IMPAIRMENTS Abnormal gait, decreased activity tolerance, decreased balance, decreased coordination, decreased endurance, decreased mobility, difficulty walking, decreased strength, impaired flexibility, impaired sensation, improper body mechanics, and pain.   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   REHAB POTENTIAL: Good  CLINICAL DECISION MAKING: Evolving/moderate complexity  EVALUATION COMPLEXITY: Moderate  PLAN: PT FREQUENCY: 2x/week  PT DURATION: 12 weeks  PLANNED INTERVENTIONS: Therapeutic exercises, Therapeutic activity, Neuromuscular re-education, Balance training, Gait training, Patient/Family education, Self Care, Joint mobilization, Stair training, Vestibular training, Canalith repositioning, Visual/preceptual remediation/compensation, DME instructions, Dry Needling, Cognitive remediation, Spinal mobilization, Cryotherapy, Moist heat, Manual lymph drainage, Compression bandaging, Taping, Vasopneumatic device, Ultrasound, Manual therapy, and Re-evaluation  PLAN FOR NEXT SESSION: Continue to progress dynamic balance and dual tasking balance. Work  on single leg balance progressions.     Janna Arch PT  Physical Therapist- [facility]  10/30/22, 11:01 AM

## 2022-10-30 ENCOUNTER — Ambulatory Visit (INDEPENDENT_AMBULATORY_CARE_PROVIDER_SITE_OTHER): Payer: Medicare Other | Admitting: Urology

## 2022-10-30 ENCOUNTER — Ambulatory Visit: Payer: Medicare Other | Attending: Internal Medicine

## 2022-10-30 ENCOUNTER — Ambulatory Visit: Payer: Medicare Other | Admitting: Occupational Therapy

## 2022-10-30 VITALS — BP 153/70 | HR 82 | Ht 72.0 in | Wt 251.0 lb

## 2022-10-30 DIAGNOSIS — R2681 Unsteadiness on feet: Secondary | ICD-10-CM

## 2022-10-30 DIAGNOSIS — Z87442 Personal history of urinary calculi: Secondary | ICD-10-CM

## 2022-10-30 DIAGNOSIS — R278 Other lack of coordination: Secondary | ICD-10-CM

## 2022-10-30 DIAGNOSIS — M6281 Muscle weakness (generalized): Secondary | ICD-10-CM | POA: Diagnosis not present

## 2022-10-30 DIAGNOSIS — N1330 Unspecified hydronephrosis: Secondary | ICD-10-CM | POA: Diagnosis not present

## 2022-10-30 DIAGNOSIS — R262 Difficulty in walking, not elsewhere classified: Secondary | ICD-10-CM | POA: Diagnosis present

## 2022-10-30 DIAGNOSIS — N200 Calculus of kidney: Secondary | ICD-10-CM

## 2022-10-30 DIAGNOSIS — R269 Unspecified abnormalities of gait and mobility: Secondary | ICD-10-CM

## 2022-10-30 LAB — URINALYSIS, COMPLETE
Bilirubin, UA: NEGATIVE
Ketones, UA: NEGATIVE
Leukocytes,UA: NEGATIVE
Nitrite, UA: NEGATIVE
RBC, UA: NEGATIVE
Specific Gravity, UA: 1.025 (ref 1.005–1.030)
Urobilinogen, Ur: 0.2 mg/dL (ref 0.2–1.0)
pH, UA: 5 (ref 5.0–7.5)

## 2022-10-30 LAB — MICROSCOPIC EXAMINATION

## 2022-10-30 NOTE — Progress Notes (Signed)
I, Jeanmarie Hubert Maxie,acting as a scribe for Hollice Espy, MD.,have documented all relevant documentation on the behalf of Hollice Espy, MD,as directed by  Hollice Espy, MD while in the presence of Hollice Espy, MD.   10/30/22 4:31 PM   Vernia Buff [facility] 05/21/1971 [id number]  Referring provider: [name], [place] [id number],  [place] [address]  Chief Complaint  Patient presents with   Establish Care   Nephrolithiasis    HPI: 52 year-old male with a history of end-stage renal disease referred for further evaluation of kidney stones. His last cross-sectional imaging, a CT of the abdomen and pelvis performed on 08-22-2022, revealed a 1-2 mm left ureterovesical junction stone with left hydroureteronephrosis. He has had no subsequent imaging.   He had an admission over six months ago for severe left flank pain episode, with a CT stone showing mild to moderate left hydronephrosis and ureteral dilation, but no obvious obstructing stone. He was seen as an inpatient dung that admission by Dr. Roni Bread and was recommended for further evaluation. He was contacted by our office for outpatient follow-up but was unreachable. He subsequently had follow-up imaging in December, showing a punctate UVJ stone, likely the cause of his hydronephrosis, and he believes he has passed the stone. He has no significant residual or additional upper tract stone burden.  His urinalysis today is negative.   He has had a history of kidney stones 20-23 years ago. He reports significant pain in December, leading to an emergency visit. He believes he passed the stone the following morning as his pain resolved.   He is a dialysis patient and reports making a significant amount of urine. He has concerns about his dialysis and fluid intake, noting he feels worse after dialysis sessions. He is in the process of getting on a transplant list at [facility].  PMH: Past Medical History:  Diagnosis Date    Allergy    Anemia    Bell's palsy    Diabetes mellitus without complication ([place])    diet controlled   Hypertension    Hypothyroidism    Kidney stones    Pseudotumor cerebri    Stroke [facility])     Surgical History: Past Surgical History:  Procedure Laterality Date   COLONOSCOPY WITH PROPOFOL N/A 03/30/2020   Procedure: COLONOSCOPY WITH PROPOFOL;  Surgeon: Lesly Rubenstein, MD;  Location: [facility] ENDOSCOPY;  Service: Endoscopy;  Laterality: N/A;   LOOP RECORDER INSERTION N/A 01/27/2018   Procedure: LOOP RECORDER INSERTION;  Surgeon: [name], MD;  Location: [place] CV LAB;  Service: Cardiovascular;  Laterality: N/A;   LUMBAR PUNCTURE     as child   NO PAST SURGERIES     TEE WITHOUT CARDIOVERSION N/A 01/07/2018   Procedure: TRANSESOPHAGEAL ECHOCARDIOGRAM (TEE);  Surgeon: Minna Merritts, MD;  Location: [facility] ORS;  Service: Cardiovascular;  Laterality: N/A;    Home Medications:  Allergies as of 10/30/2022   No Known Allergies      Medication List        Accurate as of October 30, 2022  4:31 PM. If you have any questions, ask your nurse or doctor.          Accu-Chek FastClix Lancets Misc USE TO CHECK BLOOD SUGAR THREE TIMES DAILY AS DIRECTED   amLODipine 10 MG tablet Commonly known as: NORVASC TAKE 1 TABLET(10 MG) BY MOUTH DAILY   ammonium lactate 12 % cream Commonly known as: AMLACTIN Apply 1 Application topically as needed for dry  skin.   aspirin EC 81 MG tablet Commonly known as: Aspirin Low Dose TAKE 1 TABLET(81 MG) BY MOUTH DAILY   glucose blood test strip Commonly known as: FREESTYLE LITE Check blood sugars twice a day (Dx. 250.02)   hydrALAZINE 50 MG tablet Commonly known as: APRESOLINE TAKE 1 TABLET(50 MG) BY MOUTH THREE TIMES DAILY   insulin glargine 100 UNIT/ML injection Commonly known as: Lantus Inject 0.13 mLs (13 Units total) into the skin once daily. What changed: how much to take   insulin lispro 100 UNIT/ML injection Commonly known  as: HUMALOG Inject 4-12 units three times daily before meals per sliding scale: If premeal sugar is <130, inject 4 units; if 131-180, inject 6 units; if 181-240, inject 8 units; if 241-300, inject 10 units, if  >300, inject 12 units (max daily dose 36 units)   INSULIN SYRINGE .5CC/30GX5/16" 30G X 5/16" 0.5 ML Misc USE AS DIRECTED WITH INSULIN   levothyroxine 175 MCG tablet Commonly known as: SYNTHROID TAKE 1 TABLET (175 MCG) BY MOUTH ONCE DAILY BEFORE BREAKFAST.   pregabalin 100 MG capsule Commonly known as: LYRICA TAKE 1 CAPSULE(100 MG) BY MOUTH THREE TIMES DAILY What changed: See the new instructions.   Renvela 800 MG tablet Generic drug: sevelamer carbonate Take 800 mg by mouth 3 (three) times daily.   rosuvastatin 5 MG tablet Commonly known as: CRESTOR Take 5 mg by mouth daily. What changed: Another medication with the same name was removed. Continue taking this medication, and follow the directions you see here. Changed by: Hollice Espy, MD   SYRINGE 3CC/25GX1" 25G X 1" 3 ML Misc Use as directed to administer b12 injections.        Family History: Family History  Problem Relation Age of Onset   Breast cancer Mother    Diabetes Father    Diabetes Sister    Arthritis Maternal Grandmother    Diabetes Maternal Grandmother     Social History:  reports that he has never smoked. He has never used smokeless tobacco. He reports that he does not drink alcohol and does not use drugs.   Physical Exam: BP (!) 153/70   Pulse 82   Ht 6' (1.829 m)   Wt 251 lb (113.9 kg)   BMI 34.04 kg/m   Constitutional:  Alert and oriented, No acute distress. HEENT: [place] AT, moist mucus membranes.  Trachea midline, no masses. Neurologic: Grossly intact, no focal deficits, moving all 4 extremities. Psychiatric: Normal mood and affect.  Pertinent Imaging:  Results for orders placed during the hospital encounter of 08/22/22  CT Renal Stone Study  Narrative CLINICAL DATA:  Left-sided  lower back pain  EXAM: CT ABDOMEN AND PELVIS WITHOUT CONTRAST  TECHNIQUE: Multidetector CT imaging of the abdomen and pelvis was performed following the standard protocol without IV contrast.  RADIATION DOSE REDUCTION: This exam was performed according to the departmental dose-optimization program which includes automated exposure control, adjustment of the mA and/or kV according to patient size and/or use of iterative reconstruction technique.  COMPARISON:  CT 05/04/2022  FINDINGS: Lower chest: Bronchial wall thickening and mucous plugging in the left greater than right lower lobes with centrilobular micro nodularity. Trace pericardial effusion.  Hepatobiliary: Cholecystectomy.  No focal liver abnormality.  Pancreas: Fatty atrophy.  No acute inflammatory change.  Spleen: Enlarged.  Adrenals/Urinary Tract: Normal adrenal glands. Nonspecific perinephric stranding bilaterally, greater on the left. Marked left hydroureteronephrosis not significantly changed from 05/04/2022. Tiny 1-2 mm stone at the left UVJ (2/88). Unremarkable bladder.  Stomach/Bowel: Unremarkable stomach. Normal caliber large and small bowel. Normal appendix.  Vascular/Lymphatic: Minimal aorto bi-iliac atherosclerotic calcification. Lymphadenopathy.  Reproductive: Unremarkable prostate.  Other: Trace free fluid in the pelvis.  No free intraperitoneal air.  Musculoskeletal: No acute abnormality.  IMPRESSION: Tiny 1-2 mm stone at the left UVJ. Marked left hydroureteronephrosis similar to 05/04/2022. Nonspecific left-greater-than-right perinephric stranding is similar to 05/04/2022.  Similar splenomegaly.  Small airway infection/inflammation in the lower lobes.   Electronically Signed By: Placido Sou M.D. On: 08/22/2022 01:29  Personally reviewed and agree with radiologic interpretation.  Assessment & Plan:    Left hydronephrosis - He has no current pain or significant stone burden.  -  An ultrasound of the kidney is ordered to confirm the resolution of hydronephrosis. We will contact regarding his ultrasound, and follow-up will be based on the ultrasound results.  - Advised to avoid oxalate-rich foods and a high sodium diet, although fluid intake recommendations are complicated by his dialysis status. - He has concerns about his dialysis and fluid intake, which he is advised to discuss with his nephrologist. He is in the process of getting on a transplant list at [facility].   Return for renal ultrasound.   [place] [address], [place], [place] [address] [phone]

## 2022-10-31 NOTE — Therapy (Signed)
OUTPATIENT PHYSICAL THERAPY NEURO TREATMENT   Patient Name: Gregory Crane MRN: [id number] DOB:10-23-1970, 52 y.o., male Today's Date: 11/04/2022  PCP: [name] MD REFERRING PROVIDER: [name] MD  PT End of Session - 11/04/22 1028     Visit Number 34    Number of Visits 40    Date for PT Re-Evaluation 11/13/22    Authorization Type Medicare A&B; Medicaid; 09/11/22    Authorization Time Period 08/21/22-11/13/22    Progress Note Due on Visit 30    PT Start Time 1015    PT Stop Time 1059    PT Time Calculation (min) 44 min    Equipment Utilized During Treatment Gait belt    Activity Tolerance Patient tolerated treatment well;No increased pain;Patient limited by fatigue    Behavior During Therapy [facility] for tasks assessed/performed                               Past Medical History:  Diagnosis Date   Allergy    Anemia    Bell's palsy    Diabetes mellitus without complication ([place])    diet controlled   Hypertension    Hypothyroidism    Kidney stones    Pseudotumor cerebri    Stroke [facility])    Past Surgical History:  Procedure Laterality Date   COLONOSCOPY WITH PROPOFOL N/A 03/30/2020   Procedure: COLONOSCOPY WITH PROPOFOL;  Surgeon: Lesly Rubenstein, MD;  Location: [facility] ENDOSCOPY;  Service: Endoscopy;  Laterality: N/A;   LOOP RECORDER INSERTION N/A 01/27/2018   Procedure: LOOP RECORDER INSERTION;  Surgeon: [name], MD;  Location: [place] CV [facility];  Service: Cardiovascular;  Laterality: N/A;   LUMBAR PUNCTURE     as child   NO PAST SURGERIES     TEE WITHOUT CARDIOVERSION N/A 01/07/2018   Procedure: TRANSESOPHAGEAL ECHOCARDIOGRAM (TEE);  Surgeon: Minna Merritts, MD;  Location: [facility] ORS;  Service: Cardiovascular;  Laterality: N/A;   Patient Active Problem List   Diagnosis Date Noted   Unsteady gait 10/05/2022   Hydronephrosis, left 05/04/2022   Type II diabetes mellitus with renal manifestations ([place]) 05/04/2022   Leukocytosis  05/04/2022   Pre-op evaluation 04/24/2022   Headache 07/07/2021   Hearing loss 07/07/2021   Open wound 01/25/2021   History of colon polyps 10/15/2020   Postoperative hemorrhage involving digestive system following digestive system procedure 09/19/2020   Acute cholecystitis without calculus 09/09/2020   Type 2 diabetes mellitus, with long-term current use of insulin ([place]) 09/09/2020   Cryptogenic stroke ([place]) 07/13/2020   History of loop recorder 07/13/2020   Weakness 06/18/2020   History of 2019 novel coronavirus disease (COVID-19) 05/20/2020   ESRD (end stage renal disease) ([place]) 04/04/2020   Pneumonia due to COVID-19 virus 04/03/2020   AKI (acute kidney injury) ([place]) 04/03/2020   Elevated troponin 04/03/2020   Acquired trigger finger 06/08/2019   Lymphedema 06/08/2019   Anemia 04/17/2019   Swelling of left lower extremity 01/10/2019   Facial droop 04/09/2018   Daytime somnolence 03/30/2018   Carotid artery disease ([place]) 02/03/2018   Intracranial vascular stenosis 09/12/2017   Cough 01/20/2017   Bell's palsy 11/10/2016   History of CVA (cerebrovascular accident) 11/10/2016   Benign localized hyperplasia of prostate with urinary obstruction 10/27/2016   History of nephrolithiasis 10/27/2016   TIA (transient ischemic attack) 10/20/2016   Near syncope 06/23/2016   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   Neuropathy 08/06/2015   Health care maintenance 08/06/2015  Essential hypertension 08/06/2015   Heme positive stool 10/10/2013   Hypothyroidism 10/10/2013   Microalbuminuria 10/10/2013   Hyperlipidemia 10/10/2013   B12 deficiency 10/10/2013   Diabetes ([place]) 07/04/2013   Environmental allergies 07/04/2013   ONSET DATE: 2-3 years  REFERRING DIAG: Neuropathy  THERAPY DIAG:  Muscle weakness (generalized)  Other lack of coordination  Unsteadiness on feet  Difficulty in walking, not elsewhere classified  Rationale for Evaluation and Treatment Rehabilitation  SUBJECTIVE:                                                                                                                                                                                              SUBJECTIVE STATEMENT: Patient reports he is feeling off, like when his blood pressure drops at dialysis. Knows it is not his blood sugar.    Pt accompanied by: self  PERTINENT HISTORY: Patient presents to physical therapy for neuropathy. He underwent banding of his L UE fistula on 05/13/22 and scheduled for additional banding on 06/07/22. 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 Patient reports his balance is very unsteady due to neuropathy in feet, feels weak in LE's.   PAIN:  Are you having pain? Yes: NPRS scale: 0/10 Pain location: L foot Pain description: sharp  Aggravating factors: worse at night Relieving factors: lyrica   PRECAUTIONS: Fall  WEIGHT BEARING RESTRICTIONS Yes no lifting >5 lb in arm   FALLS: Has patient fallen in last 6 months? No  LIVING ENVIRONMENT: Lives with: lives alone Lives in: House/apartment Stairs: Yes: Internal: flight steps; on right going up Has following equipment at home: Single point cane and Walker - 2 wheeled  PLOF: Independent  PATIENT GOALS to be more steady and walk as normally as possible.   OBJECTIVE:   LOWER EXTREMITY MMT:    MMT Right Eval Left Eval  Hip flexion 4- 3+  Hip extension    Hip abduction 4- 4+  Hip adduction 4- 3+  Knee flexion 4 4-  Knee extension 4 4  Ankle dorsiflexion 2+ 2+  Ankle plantarflexion 3+ 3+  (Blank rows = not tested)  FUNCTIONAL TESTs:  5 times sit to stand: 19.6 seconds with walking stick; one LOB 10 meter walk test: 12 seconds Berg Balance Scale: 33/56  PATIENT SURVEYS:  ABC scale 67% FOTO 53%  TODAY'S TREATMENT:  BP  seated:146/69 BP standing 134/60  BP standing 2 minutes: 114/63   HR 70 SP02 99  TREATMENT:     TherEx:  Seated 5lb ankle weight: -March 20x each LE -LAQ 20x each LE, alternating -alternating ER/IR 20x each LE   -heel raise 20x  Seated: UE/LE alternating raises 15x each side Adduction ball squeeze 20x  Adduction squeeze with LAQ 10x  Hamstring isometric pressing into dynadisc 15x 3 second holds each LE  Straight leg abduction adduction 10x each LE Glute squeeze 15x 3 second hold  PATIENT EDUCATION: Education details: HEP, goals, POC Person educated: Patient Education method: Explanation, Demonstration, Tactile cues, Verbal cues, and Handouts Education comprehension: verbalized understanding, returned demonstration, verbal cues required, and tactile cues required  HOME EXERCISE PROGRAM: Access Code: [email or web address] URL: [email or web address] Date: 06/03/2022 Prepared by: Janna Arch  Exercises - Seated Heel Toe Raises  - 1 x daily - 7 x weekly - 2 sets - 10 reps - 5 hold - Standing Tandem Balance with Counter Support  - 1 x daily - 7 x weekly - 2 sets - 2 reps - 30 hold - Standing March with Counter Support  - 1 x daily - 7 x weekly - 2 sets - 10 reps - 5 hold   GOALS: Goals reviewed with patient? Yes   SHORT TERM GOALS: Target date: 07/01/2022  Patient will be independent in home exercise program to improve strength/mobility for better functional independence with ADLs. Baseline:10/9; HEP given 2/21: HEP compliant  Goal status: MET   LONG TERM GOALS: Target date: 11/13/2022  Patient will increase FOTO score to equal to or greater than   63%  to demonstrate statistically significant improvement in mobility and quality of life.  Baseline: 10/9: 53% 11/29: 58% 12/27: 51% 1/17: 56% 2/21: 53% Goal status: IN PROGRESS  2.  Patient (< 44 years old) will complete five times sit to stand test in < 10 seconds without UE support indicating an increased LE  strength and improved balance. Baseline: 10/9: 19.6 seconds with walking stick; one LOB 11/29: 20.56 seconds 12/27: 13.4 seconds hands on knees 1/17: 13 seconds hands on knees  2/21: 14 seconds no hands Goal status: IN PROGRESS  3.  Patient will increase Berg Balance score by > 45/56 to demonstrate decreased fall risk during functional activities. Baseline: 10/9: 33/56 11/29: 43/56 12/27: 44/56 1/17:  44/56 2/21: 46/56  Goal status: IN PROGRESS  4.  Patient will increase 10 meter walk test to >1.40ms as to improve gait speed for better community ambulation and to reduce fall risk. Baseline: 11/29: 1.19 m/s Goal status: MET  5.  Patient will increase ABC scale score >80% to demonstrate better functional mobility and better confidence with ADLs.  Baseline: 10/9: 67% 11/29: 75.6% 12/27: 78%  2/21: 78% Goal status: IN PROGRESS  6. Patient will tolerate 5 seconds of single leg stance without loss of balance to improve ability to get in and out of shower safely. Baseline: 11/29: unable to stand on single leg safely 12/27: 2 seconds 1/17: 3 seconds 2/21: 3 seconds  Goal status: IN PROGRESS   6. Patient will increase six minute walk test distance to >1000 for progression to community ambulator and improve gait ability Baseline: 12/27: 845 ft with with walking stick 1/17: [id number]/21: 930 ft  Goal status: partially met    ASSESSMENT:  CLINICAL IMPRESSION: Patient presents with orthostatic hypotension symptoms, vitals monitored due to patient dizziness and light headedness. Physician notified of symptoms, awaiting response. Gentle seated exercises tolerated. Multiple rest breaks required.  Pt will continue to benefit from skilled therapy to address remaining deficits in order to improve overall QoL  and return to PLOF.     OBJECTIVE IMPAIRMENTS Abnormal gait, decreased activity tolerance, decreased balance, decreased coordination, decreased endurance, decreased mobility, difficulty walking,  decreased strength, impaired flexibility, impaired sensation, improper body mechanics, and pain.   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   REHAB POTENTIAL: Good  CLINICAL DECISION MAKING: Evolving/moderate complexity  EVALUATION COMPLEXITY: Moderate  PLAN: PT FREQUENCY: 2x/week  PT DURATION: 12 weeks  PLANNED INTERVENTIONS: Therapeutic exercises, Therapeutic activity, Neuromuscular re-education, Balance training, Gait training, Patient/Family education, Self Care, Joint mobilization, Stair training, Vestibular training, Canalith repositioning, Visual/preceptual remediation/compensation, DME instructions, Dry Needling, Cognitive remediation, Spinal mobilization, Cryotherapy, Moist heat, Manual lymph drainage, Compression bandaging, Taping, Vasopneumatic device, Ultrasound, Manual therapy, and Re-evaluation  PLAN FOR NEXT SESSION: Continue to progress dynamic balance and dual tasking balance. Work on single leg balance progressions.     Janna Arch PT  Physical Therapist- [facility]  11/04/22, 10:59 AM

## 2022-11-01 ENCOUNTER — Encounter: Payer: Self-pay | Admitting: Occupational Therapy

## 2022-11-01 NOTE — Therapy (Signed)
OUTPATIENT OCCUPATIONAL THERAPY NEURO TREATMENT  Patient Name: Gregory Crane MRN: [id number] DOB:11/19/1970, 52 y.o., male Today's Date: 10/30/2022  PCP: [name] REFERRING PROVIDER: [name]   END OF SESSION:  OT End of Session - 11/01/22 1959     Visit Number 3    Number of Visits 24    Date for OT Re-Evaluation 01/13/23    OT Start Time 1101    OT Stop Time 1150    OT Time Calculation (min) 49 min    Activity Tolerance Patient tolerated treatment well    Behavior During Therapy [facility] for tasks assessed/performed             Past Medical History:  Diagnosis Date   Allergy    Anemia    Bell's palsy    Diabetes mellitus without complication ([place])    diet controlled   Hypertension    Hypothyroidism    Kidney stones    Pseudotumor cerebri    Stroke [facility])    Past Surgical History:  Procedure Laterality Date   COLONOSCOPY WITH PROPOFOL N/A 03/30/2020   Procedure: COLONOSCOPY WITH PROPOFOL;  Surgeon: Lesly Rubenstein, MD;  Location: [facility] ENDOSCOPY;  Service: Endoscopy;  Laterality: N/A;   LOOP RECORDER INSERTION N/A 01/27/2018   Procedure: LOOP RECORDER INSERTION;  Surgeon: [name], MD;  Location: [facility];  Service: Cardiovascular;  Laterality: N/A;   LUMBAR PUNCTURE     as child   NO PAST SURGERIES     TEE WITHOUT CARDIOVERSION N/A 01/07/2018   Procedure: TRANSESOPHAGEAL ECHOCARDIOGRAM (TEE);  Surgeon: Minna Merritts, MD;  Location: [facility] ORS;  Service: Cardiovascular;  Laterality: N/A;   Patient Active Problem List   Diagnosis Date Noted   Unsteady gait 10/05/2022   Hydronephrosis, left 05/04/2022   Type II diabetes mellitus with renal manifestations ([place]) 05/04/2022   Leukocytosis 05/04/2022   Pre-op evaluation 04/24/2022   Headache 07/07/2021   Hearing loss 07/07/2021   Open wound 01/25/2021   History of colon polyps 10/15/2020   Postoperative hemorrhage involving digestive system following digestive system procedure  09/19/2020   Acute cholecystitis without calculus 09/09/2020   Type 2 diabetes mellitus, with long-term current use of insulin ([place]) 09/09/2020   Cryptogenic stroke ([place]) 07/13/2020   History of loop recorder 07/13/2020   Weakness 06/18/2020   History of 2019 novel coronavirus disease (COVID-19) 05/20/2020   ESRD (end stage renal disease) ([place]) 04/04/2020   Pneumonia due to COVID-19 virus 04/03/2020   AKI (acute kidney injury) ([place]) 04/03/2020   Elevated troponin 04/03/2020   Acquired trigger finger 06/08/2019   Lymphedema 06/08/2019   Anemia 04/17/2019   Swelling of left lower extremity 01/10/2019   Facial droop 04/09/2018   Daytime somnolence 03/30/2018   Carotid artery disease ([place]) 02/03/2018   Intracranial vascular stenosis 09/12/2017   Cough 01/20/2017   Bell's palsy 11/10/2016   History of CVA (cerebrovascular accident) 11/10/2016   Benign localized hyperplasia of prostate with urinary obstruction 10/27/2016   History of nephrolithiasis 10/27/2016   TIA (transient ischemic attack) 10/20/2016   Near syncope 06/23/2016   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   Neuropathy 08/06/2015   Health care maintenance 08/06/2015   Essential hypertension 08/06/2015   Heme positive stool 10/10/2013   Hypothyroidism 10/10/2013   Microalbuminuria 10/10/2013   Hyperlipidemia 10/10/2013   B12 deficiency 10/10/2013   Diabetes ([place]) 07/04/2013   Environmental allergies 07/04/2013    ONSET DATE: May 2023  REFERRING DIAG: CVA  THERAPY DIAG:  Muscle weakness (  generalized)  Other lack of coordination  Unsteadiness on feet  Rationale for Evaluation and Treatment: Rehabilitation  SUBJECTIVE:   SUBJECTIVE STATEMENT: Pt reports he is doing well today, no complaints, some fatigue from PT  Pt accompanied by: self  PERTINENT HISTORY: Patient with history of CVA, banding of fistula in left arm with worsening of LUE functional use over time since May 2023, he has been seeing PT for balance  deficits.  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    PRECAUTIONS: Fall and Other: No strengthening exercises greater than 5-10 pounds in left arm.   Dialysis Tues, Th, Sat.  WEIGHT BEARING RESTRICTIONS: No  PAIN:  Are you having pain? No  FALLS: Has patient fallen in last 6 months? No  LIVING ENVIRONMENT: Lives with: alone Lives in: House/apartment Stairs: Yes: Internal: 15 steps; on right going up Has following equipment at home: Single point cane and Walker - 2 wheeled  PLOF: Independent  PATIENT GOALS: Pt reports he wants to have better use of his left hand, be able to do buttons and shoes.   OBJECTIVE:   HAND DOMINANCE: Right  ADLs: Overall ADLs: modified independent with most tasks Transfers/ambulation related to ADLs: Now walking without an assistive device, used cane in the past.  Eating: Independent with self feeding, difficulty with cutting food, requires increased focus and effort. Grooming: Independent UB Dressing: Difficulty with buttons, uses pullovers most of the time  LB Dressing: Difficulty with tying shoes Toileting: Independent Bathing: Independent Tub Shower transfers: Independent Equipment: none  IADLs: Shopping: independent,  uses a cart to push in the store Light housekeeping: modified independent Meal Prep: difficulty with tasks requiring use of left hand, bilateral hand use.   Community mobility: Ambulates without use of assistive device, able to drive Medication management: Independent Financial management: Independent Handwriting: 90% legible  MOBILITY STATUS: Needs Assist: Pt has progressed with PT, used to use a cane but ambulating without an assistive device but still has balance difficulties.    POSTURE COMMENTS:    Sitting balance: Sits without UE  support up to 30 sec  ACTIVITY TOLERANCE: Activity tolerance: limited activity tolerance  FUNCTIONAL OUTCOME MEASURES: FOTO: 53  UPPER EXTREMITY ROM:    Active ROM Right eval Left eval  Shoulder flexion [facility]  Shoulder abduction [facility]  Shoulder adduction [facility]  Shoulder extension [facility]  Shoulder internal rotation [facility]  Shoulder external rotation [facility]  Elbow flexion [facility] WFL  Elbow extension [facility]  Wrist flexion [facility] WFL  Wrist extension [facility] WFL  Wrist ulnar deviation [facility] WFL  Wrist radial deviation WFL WFL  Wrist pronation WFL WFL  Wrist supination WFL WFL  (Blank rows = not tested)  UPPER EXTREMITY MMT:     MMT Right eval Left eval  Shoulder flexion 5/5 3+/5  Shoulder abduction 5/5 3+/5  Shoulder adduction    Shoulder extension    Shoulder internal rotation    Shoulder external rotation    Middle trapezius    Lower trapezius    Elbow flexion 5/5 3+/5  Elbow extension 5/5 3+/5  Wrist flexion 5/5 3+/5  Wrist extension 5/5 3+/5  Wrist ulnar deviation 5/5 3+/5  Wrist radial deviation 5/5 3+/5  Wrist pronation 5/5 3+/5  Wrist supination 5/5 3+/5  (Blank rows = not tested)  Opposition of thumb to all digits on  right, on left hand opposition to ring finger but cannot demonstrate to small finger.   HAND FUNCTION: Grip strength: Right: 44 lbs; Left: 35 lbs, Lateral pinch: Right: 17 lbs, Left: 16 lbs, 3 point pinch: Right: 14 lbs, Left: 0 lbs, and Tip pinch: Right 2 lbs, Left: 0 lbs  COORDINATION: 9 Hole Peg test: Right: 36 sec; Left: 56 sec    TODAY'S TREATMENT:                                                                                                                              DATE: 10/30/2022  Therapeutic Exercises:  Pt seen this date for left hand strengthening exercises with the use of puttycise tools and red resistive putty. Pt utilizing the following tools: L bar, large cap turn, peg turn and key turn for strengthening of functional  hand movements. Pinch and pull with putty followed by shaping into small size balls (meatball size) and then flattening with cap turn. Gross grasp of putty. Pt required cues for grasping patterns on the variety of tools.   Neuromuscular Reeducation:   PATIENT EDUCATION: Education details: fine motor coordination, Advertising copywriter Person educated: Patient Education method: Explanation Education comprehension: verbalized understanding  HOME EXERCISE PROGRAM: Will provide HEP in 1-2 visits   GOALS: Goals reviewed with patient? Yes  SHORT TERM GOALS: Target date:  11/18/2022   Pt will demonstrate ability to hold and utilize utensils for cutting meat with modified independence. Baseline: difficulty with cutting, stabilizing item with fork for cutting. Goal status: INITIAL  2.  Pt will demonstrate ability to obtain keys from left pocket without difficult and without dropping.   Baseline: pt struggles with reaching into left pocket to get keys.   Goal status: INITIAL   LONG TERM GOALS: Target date: 01/13/2023   Pt will demonstrate HEP with modified independence.   Baseline: no current program for left UE.  Goal status: INITIAL  2.  Pt will improve left UE coordination by 10 secs on 9 hole peg test to demonstrate shoe tying with modified independence.  Baseline: currently wearing slip on shoes and unable to tie shoes, left 9 hole peg test in 56 sec at eval  Goal status: INITIAL  3.  Pt will demonstrate improved coordination in left hand to demonstrate buttoning buttons on clothing with modified independence.  Baseline: difficulty with buttons and has been wearing more pullovers to avoid challenge of buttons. Goal status: INITIAL  4.  Pt will demonstrate improvement in left grip strength by 5# to open jars and containers with modified independence and greater ease.   Baseline: Difficulty with opening jars and containers with use of left hand at eval.  Goal status: INITIAL  5.   Pt will improve left UE strength by 1 mm grade to hold and hand wash dishes with modified independence.  Baseline: difficulty with being able to hand wash dishes at eval, difficulty stabilizing dish. Goal status: INITIAL  6.  Pt will demonstrate FOTO score of 59 or greater to show a clinically relevant change in LUE to impact greater independence with daily ADL and IADL tasks at home and in the community.   Baseline:  Goal status: INITIAL  ASSESSMENT:  CLINICAL IMPRESSION: Pt responding well to cues during session for grasping, gripping patterns on tools. Pt continues to work towards improving strength of left hand with resistive exercises.   Continue to work towards goals in plan of care to improve left UE strength, coordination and improved functional use of hand into daily tasks.    PERFORMANCE DEFICITS: in functional skills including ADLs, IADLs, coordination, dexterity, sensation, tone, ROM, strength, flexibility, Fine motor control, balance, endurance, decreased knowledge of use of DME, and UE functional use, cognitive skills including, and psychosocial skills including environmental adaptation, habits, and routines and behaviors.   IMPAIRMENTS: are limiting patient from ADLs, IADLs, and social participation.   CO-MORBIDITIES: has co-morbidities such as CAD, ESRD with dialysis, deafness in right ear, diabetic retinopathy, HTN, neuropathy  that affects occupational performance. Patient will benefit from skilled OT to address above impairments and improve overall function.  MODIFICATION OR ASSISTANCE TO COMPLETE EVALUATION: Min-Moderate modification of tasks or assist with assess necessary to complete an evaluation.  OT OCCUPATIONAL PROFILE AND HISTORY: Comprehensive assessment: Review of records and extensive additional review of physical, cognitive, psychosocial history related to current functional performance.  CLINICAL DECISION MAKING: Moderate - several treatment options, min-mod  task modification necessary  REHAB POTENTIAL: Good  EVALUATION COMPLEXITY: Moderate    PLAN:  OT FREQUENCY: 2x/week  OT DURATION: 12 weeks  PLANNED INTERVENTIONS: self care/ADL training, therapeutic exercise, therapeutic activity, neuromuscular re-education, manual therapy, balance training, paraffin, moist heat, contrast bath, patient/family education, and DME and/or AE instructions  RECOMMENDED OTHER SERVICES: Pt is being seen by PT currently.   CONSULTED AND AGREED WITH PLAN OF CARE: Patient  PLAN FOR NEXT SESSION: Initiate HEP for grip, pinch and coordination skills.   Virgal Warmuth T Tomasita Morrow, OTR/L, CLT  Ranay Ketter, OT 11/01/2022, 8:10 PM

## 2022-11-04 ENCOUNTER — Ambulatory Visit: Payer: Medicare Other

## 2022-11-04 DIAGNOSIS — M6281 Muscle weakness (generalized): Secondary | ICD-10-CM | POA: Diagnosis not present

## 2022-11-04 DIAGNOSIS — R2681 Unsteadiness on feet: Secondary | ICD-10-CM

## 2022-11-04 DIAGNOSIS — R278 Other lack of coordination: Secondary | ICD-10-CM

## 2022-11-04 DIAGNOSIS — R262 Difficulty in walking, not elsewhere classified: Secondary | ICD-10-CM

## 2022-11-05 NOTE — Therapy (Signed)
OUTPATIENT PHYSICAL THERAPY NEURO TREATMENT   Patient Name: Gregory Crane MRN: [id number] DOB:Jan 12, 1971, 52 y.o., male Today's Date: 11/06/2022  PCP: [name] MD REFERRING PROVIDER: [name] MD  PT End of Session - 11/06/22 1000     Visit Number 35    Number of Visits 40    Date for PT Re-Evaluation 11/13/22    Authorization Type Medicare A&B; Medicaid; 09/11/22    Authorization Time Period 08/21/22-11/13/22    Progress Note Due on Visit 30    PT Start Time 1015    PT Stop Time 1058    PT Time Calculation (min) 43 min    Equipment Utilized During Treatment Gait belt    Activity Tolerance Patient tolerated treatment well;No increased pain;Patient limited by fatigue    Behavior During Therapy [facility] for tasks assessed/performed                                Past Medical History:  Diagnosis Date   Allergy    Anemia    Bell's palsy    Diabetes mellitus without complication ([place])    diet controlled   Hypertension    Hypothyroidism    Kidney stones    Pseudotumor cerebri    Stroke [facility])    Past Surgical History:  Procedure Laterality Date   COLONOSCOPY WITH PROPOFOL N/A 03/30/2020   Procedure: COLONOSCOPY WITH PROPOFOL;  Surgeon: Lesly Rubenstein, MD;  Location: [facility] ENDOSCOPY;  Service: Endoscopy;  Laterality: N/A;   LOOP RECORDER INSERTION N/A 01/27/2018   Procedure: LOOP RECORDER INSERTION;  Surgeon: [name], MD;  Location: [place] CV LAB;  Service: Cardiovascular;  Laterality: N/A;   LUMBAR PUNCTURE     as child   NO PAST SURGERIES     TEE WITHOUT CARDIOVERSION N/A 01/07/2018   Procedure: TRANSESOPHAGEAL ECHOCARDIOGRAM (TEE);  Surgeon: Minna Merritts, MD;  Location: [facility] ORS;  Service: Cardiovascular;  Laterality: N/A;   Patient Active Problem List   Diagnosis Date Noted   Unsteady gait 10/05/2022   Hydronephrosis, left 05/04/2022   Type II diabetes mellitus with renal manifestations ([place]) 05/04/2022    Leukocytosis 05/04/2022   Pre-op evaluation 04/24/2022   Headache 07/07/2021   Hearing loss 07/07/2021   Open wound 01/25/2021   History of colon polyps 10/15/2020   Postoperative hemorrhage involving digestive system following digestive system procedure 09/19/2020   Acute cholecystitis without calculus 09/09/2020   Type 2 diabetes mellitus, with long-term current use of insulin ([place]) 09/09/2020   Cryptogenic stroke ([place]) 07/13/2020   History of loop recorder 07/13/2020   Weakness 06/18/2020   History of 2019 novel coronavirus disease (COVID-19) 05/20/2020   ESRD (end stage renal disease) ([place]) 04/04/2020   Pneumonia due to COVID-19 virus 04/03/2020   AKI (acute kidney injury) ([place]) 04/03/2020   Elevated troponin 04/03/2020   Acquired trigger finger 06/08/2019   Lymphedema 06/08/2019   Anemia 04/17/2019   Swelling of left lower extremity 01/10/2019   Facial droop 04/09/2018   Daytime somnolence 03/30/2018   Carotid artery disease ([place]) 02/03/2018   Intracranial vascular stenosis 09/12/2017   Cough 01/20/2017   Bell's palsy 11/10/2016   History of CVA (cerebrovascular accident) 11/10/2016   Benign localized hyperplasia of prostate with urinary obstruction 10/27/2016   History of nephrolithiasis 10/27/2016   TIA (transient ischemic attack) 10/20/2016   Near syncope 06/23/2016   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   Neuropathy 08/06/2015   Health care maintenance  08/06/2015   Essential hypertension 08/06/2015   Heme positive stool 10/10/2013   Hypothyroidism 10/10/2013   Microalbuminuria 10/10/2013   Hyperlipidemia 10/10/2013   B12 deficiency 10/10/2013   Diabetes ([place]) 07/04/2013   Environmental allergies 07/04/2013   ONSET DATE: 2-3 years  REFERRING DIAG: Neuropathy  THERAPY DIAG:  Muscle weakness (generalized)  Unsteadiness on feet  Difficulty in walking, not elsewhere classified  Rationale for Evaluation and Treatment Rehabilitation  SUBJECTIVE:                                                                                                                                                                                              SUBJECTIVE STATEMENT: Patient reports his doctor hasn't called him back. Patient reports the dialysis doctor changed his BP meds.    Pt accompanied by: self  PERTINENT HISTORY: Patient presents to physical therapy for neuropathy. He underwent banding of his L UE fistula on 05/13/22 and scheduled for additional banding on 06/07/22. 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 Patient reports his balance is very unsteady due to neuropathy in feet, feels weak in LE's.   PAIN:  Are you having pain? Yes: NPRS scale: 0/10 Pain location: L foot Pain description: sharp  Aggravating factors: worse at night Relieving factors: lyrica   PRECAUTIONS: Fall  WEIGHT BEARING RESTRICTIONS Yes no lifting >5 lb in arm   FALLS: Has patient fallen in last 6 months? No  LIVING ENVIRONMENT: Lives with: lives alone Lives in: House/apartment Stairs: Yes: Internal: flight steps; on right going up Has following equipment at home: Single point cane and Walker - 2 wheeled  PLOF: Independent  PATIENT GOALS to be more steady and walk as normally as possible.   OBJECTIVE:   LOWER EXTREMITY MMT:    MMT Right Eval Left Eval  Hip flexion 4- 3+  Hip extension    Hip abduction 4- 4+  Hip adduction 4- 3+  Knee flexion 4 4-  Knee extension 4 4  Ankle dorsiflexion 2+ 2+  Ankle plantarflexion 3+ 3+  (Blank rows = not tested)  FUNCTIONAL TESTs:  5 times sit to stand: 19.6 seconds with walking stick; one LOB 10 meter walk test: 12 seconds Berg Balance Scale: 33/56  PATIENT SURVEYS:  ABC scale 67% FOTO 53%  TODAY'S TREATMENT:  BP seated:145/63 BP  standing:116/62 BP standing 2 minutes: 124/64    TREATMENT:     TherEx:  5lb ankle weights: walk 450 ft with CGA no rest breaks; good foot  clearance; challenging for foot clearance    Seated 5lb ankle weight: -March 20x each LE -LAQ 20x each LE, alternating; hold 5 seconds -alternating ER/IR 20x each LE  ; ball between feet  -heel raise 20x with adduction ball squeeze between knees   10x STS    Neuro Re-ed:  Activity Description: lateral step to lit up pod. Red= 5 squats, yellow=5 marches, blue=5 step backs Activity Setting:  The Blaze Pod Random setting was chosen to enhance cognitive processing and agility, providing an unpredictable environment to simulate real-world scenarios, and fostering quick reactions and adaptability.   Number of Pods:  6 Cycles/Sets:  2 Duration (Time or Hit Count):  10  Activity Description: red=right hand/foot, blue=left hand/foot 3 on floor, 3 on table Activity Setting:  The Blaze Pod Random setting was chosen to enhance cognitive processing and agility, providing an unpredictable environment to simulate real-world scenarios, and fostering quick reactions and adaptability.   Number of Pods:  6 Cycles/Sets:  2 Duration (Time or Hit Count):  20      PATIENT EDUCATION: Education details: HEP, goals, POC Person educated: Patient Education method: Explanation, Demonstration, Tactile cues, Verbal cues, and Handouts Education comprehension: verbalized understanding, returned demonstration, verbal cues required, and tactile cues required  HOME EXERCISE PROGRAM: Access Code: [email or web address] URL: [email or web address] Date: 06/03/2022 Prepared by: Janna Arch  Exercises - Seated Heel Toe Raises  - 1 x daily - 7 x weekly - 2 sets - 10 reps - 5 hold - Standing Tandem Balance with Counter Support  - 1 x daily - 7 x weekly - 2 sets - 2 reps - 30 hold - Standing March with Counter Support  - 1 x daily - 7 x weekly - 2 sets - 10 reps - 5  hold   GOALS: Goals reviewed with patient? Yes   SHORT TERM GOALS: Target date: 07/01/2022  Patient will be independent in home exercise program to improve strength/mobility for better functional independence with ADLs. Baseline:10/9; HEP given 2/21: HEP compliant  Goal status: MET   LONG TERM GOALS: Target date: 11/13/2022  Patient will increase FOTO score to equal to or greater than   63%  to demonstrate statistically significant improvement in mobility and quality of life.  Baseline: 10/9: 53% 11/29: 58% 12/27: 51% 1/17: 56% 2/21: 53% Goal status: IN PROGRESS  2.  Patient (< 64 years old) will complete five times sit to stand test in < 10 seconds without UE support indicating an increased LE strength and improved balance. Baseline: 10/9: 19.6 seconds with walking stick; one LOB 11/29: 20.56 seconds 12/27: 13.4 seconds hands on knees 1/17: 13 seconds hands on knees  2/21: 14 seconds no hands Goal status: IN PROGRESS  3.  Patient will increase Berg Balance score by > 45/56 to demonstrate decreased fall risk during functional activities. Baseline: 10/9: 33/56 11/29: 43/56 12/27: 44/56 1/17:  44/56 2/21: 46/56  Goal status: IN PROGRESS  4.  Patient will increase 10 meter walk test to >1.73ms as to improve gait speed for better community ambulation and to reduce fall risk. Baseline: 11/29: 1.19 m/s Goal status: MET  5.  Patient will increase ABC scale score >80% to demonstrate better functional mobility and better confidence with ADLs.  Baseline: 10/9: 67% 11/29: 75.6% 12/27: 78%  2/21: 78% Goal status: IN PROGRESS  6. Patient will tolerate 5 seconds of single leg stance without loss of balance to improve ability to get in and out of shower safely. Baseline: 11/29: unable to  stand on single leg safely 12/27: 2 seconds 1/17: 3 seconds 2/21: 3 seconds  Goal status: IN PROGRESS   6. Patient will increase six minute walk test distance to >1000 for progression to community ambulator  and improve gait ability Baseline: 12/27: 845 ft with with walking stick 1/17: 774f 2/21: 930 ft  Goal status: partially met    ASSESSMENT:  CLINICAL IMPRESSION: Patient presents with excellent motivation to physical therapy. Use of blazepods with colors that patient is able to differentiate was used for progressive stability and strengthening interventions. Occasional posterior LOB with backwards step noted.  Patient is aware of upcoming recert appointment. Pt will continue to benefit from skilled therapy to address remaining deficits in order to improve overall QoL and return to PLOF.     OBJECTIVE IMPAIRMENTS Abnormal gait, decreased activity tolerance, decreased balance, decreased coordination, decreased endurance, decreased mobility, difficulty walking, decreased strength, impaired flexibility, impaired sensation, improper body mechanics, and pain.   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   REHAB POTENTIAL: Good  CLINICAL DECISION MAKING: Evolving/moderate complexity  EVALUATION COMPLEXITY: Moderate  PLAN: PT FREQUENCY: 2x/week  PT DURATION: 12 weeks  PLANNED INTERVENTIONS: Therapeutic exercises, Therapeutic activity, Neuromuscular re-education, Balance  training, Gait training, Patient/Family education, Self Care, Joint mobilization, Stair training, Vestibular training, Canalith repositioning, Visual/preceptual remediation/compensation, DME instructions, Dry Needling, Cognitive remediation, Spinal mobilization, Cryotherapy, Moist heat, Manual lymph drainage, Compression bandaging, Taping, Vasopneumatic device, Ultrasound, Manual therapy, and Re-evaluation  PLAN FOR NEXT SESSION: Continue to progress dynamic balance and dual tasking balance. Work on single leg balance progressions.     MJanna ArchPT  Physical Therapist- [facility] 11/06/22, 11:00 AM

## 2022-11-06 ENCOUNTER — Ambulatory Visit: Payer: Medicare Other

## 2022-11-06 ENCOUNTER — Ambulatory Visit: Payer: Medicare Other | Admitting: Occupational Therapy

## 2022-11-06 DIAGNOSIS — M6281 Muscle weakness (generalized): Secondary | ICD-10-CM

## 2022-11-06 DIAGNOSIS — R278 Other lack of coordination: Secondary | ICD-10-CM

## 2022-11-06 DIAGNOSIS — R2681 Unsteadiness on feet: Secondary | ICD-10-CM

## 2022-11-06 DIAGNOSIS — R262 Difficulty in walking, not elsewhere classified: Secondary | ICD-10-CM

## 2022-11-07 ENCOUNTER — Encounter: Payer: Self-pay | Admitting: Occupational Therapy

## 2022-11-07 NOTE — Therapy (Signed)
OUTPATIENT PHYSICAL THERAPY NEURO TREATMENT/RECERT   Patient Name: Gregory Crane MRN: [id number] DOB:03/17/1971, 52 y.o., male Today's Date: 11/11/2022  PCP: [name] MD REFERRING PROVIDER: [name] MD  PT End of Session - 11/11/22 1018     Visit Number 36    Number of Visits 60    Date for PT Re-Evaluation 02/03/23    Authorization Type Medicare A&B; Medicaid; 09/11/22    Authorization Time Period 08/21/22-11/13/22    Progress Note Due on Visit 30    PT Start Time 1015    PT Stop Time 1059    PT Time Calculation (min) 44 min    Equipment Utilized During Treatment Gait belt    Activity Tolerance Patient tolerated treatment well;No increased pain;Patient limited by fatigue    Behavior During Therapy [facility] for tasks assessed/performed                                 Past Medical History:  Diagnosis Date   Allergy    Anemia    Bell's palsy    Diabetes mellitus without complication ([place])    diet controlled   Hypertension    Hypothyroidism    Kidney stones    Pseudotumor cerebri    Stroke [facility])    Past Surgical History:  Procedure Laterality Date   COLONOSCOPY WITH PROPOFOL N/A 03/30/2020   Procedure: COLONOSCOPY WITH PROPOFOL;  Surgeon: Lesly Rubenstein, MD;  Location: [facility] ENDOSCOPY;  Service: Endoscopy;  Laterality: N/A;   LOOP RECORDER INSERTION N/A 01/27/2018   Procedure: LOOP RECORDER INSERTION;  Surgeon: [name], MD;  Location: [place] CV LAB;  Service: Cardiovascular;  Laterality: N/A;   LUMBAR PUNCTURE     as child   NO PAST SURGERIES     TEE WITHOUT CARDIOVERSION N/A 01/07/2018   Procedure: TRANSESOPHAGEAL ECHOCARDIOGRAM (TEE);  Surgeon: Minna Merritts, MD;  Location: [facility] ORS;  Service: Cardiovascular;  Laterality: N/A;   Patient Active Problem List   Diagnosis Date Noted   Unsteady gait 10/05/2022   Hydronephrosis, left 05/04/2022   Type II diabetes mellitus with renal manifestations ([place]) 05/04/2022    Leukocytosis 05/04/2022   Pre-op evaluation 04/24/2022   Headache 07/07/2021   Hearing loss 07/07/2021   Open wound 01/25/2021   History of colon polyps 10/15/2020   Postoperative hemorrhage involving digestive system following digestive system procedure 09/19/2020   Acute cholecystitis without calculus 09/09/2020   Type 2 diabetes mellitus, with long-term current use of insulin ([place]) 09/09/2020   Cryptogenic stroke ([place]) 07/13/2020   History of loop recorder 07/13/2020   Weakness 06/18/2020   History of 2019 novel coronavirus disease (COVID-19) 05/20/2020   ESRD (end stage renal disease) ([place]) 04/04/2020   Pneumonia due to COVID-19 virus 04/03/2020   AKI (acute kidney injury) ([place]) 04/03/2020   Elevated troponin 04/03/2020   Acquired trigger finger 06/08/2019   Lymphedema 06/08/2019   Anemia 04/17/2019   Swelling of left lower extremity 01/10/2019   Facial droop 04/09/2018   Daytime somnolence 03/30/2018   Carotid artery disease ([place]) 02/03/2018   Intracranial vascular stenosis 09/12/2017   Cough 01/20/2017   Bell's palsy 11/10/2016   History of CVA (cerebrovascular accident) 11/10/2016   Benign localized hyperplasia of prostate with urinary obstruction 10/27/2016   History of nephrolithiasis 10/27/2016   TIA (transient ischemic attack) 10/20/2016   Near syncope 06/23/2016   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   Neuropathy 08/06/2015   Health care  maintenance 08/06/2015   Essential hypertension 08/06/2015   Heme positive stool 10/10/2013   Hypothyroidism 10/10/2013   Microalbuminuria 10/10/2013   Hyperlipidemia 10/10/2013   B12 deficiency 10/10/2013   Diabetes ([place]) 07/04/2013   Environmental allergies 07/04/2013   ONSET DATE: 2-3 years  REFERRING DIAG: Neuropathy  THERAPY DIAG:  Muscle weakness (generalized)  Unsteadiness on feet  Difficulty in walking, not elsewhere classified  Abnormality of gait and mobility  Rationale for Evaluation and Treatment  Rehabilitation  SUBJECTIVE:                                                                                                                                                                                             SUBJECTIVE STATEMENT: Patient reports he had the stomach bug. Has had some issues with his blood sugar but has been monitoring it.  Patient reports he feels 40% to where he wants to be, can get up and down stairs and walk without an AD. Wants to be able to stand without losing balance, walk without a limp.    Pt accompanied by: self  PERTINENT HISTORY: Patient presents to physical therapy for neuropathy. He underwent banding of his L UE fistula on 05/13/22 and scheduled for additional banding on 06/07/22. 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 Patient reports his balance is very unsteady due to neuropathy in feet, feels weak in LE's.   PAIN:  Are you having pain? Yes: NPRS scale: 0/10 Pain location: L foot Pain description: sharp  Aggravating factors: worse at night Relieving factors: lyrica   PRECAUTIONS: Fall  WEIGHT BEARING RESTRICTIONS Yes no lifting >5 lb in arm   FALLS: Has patient fallen in last 6 months? No  LIVING ENVIRONMENT: Lives with: lives alone Lives in: House/apartment Stairs: Yes: Internal: flight steps; on right going up Has following equipment at home: Single point cane and Walker - 2 wheeled  PLOF: Independent  PATIENT GOALS to be more steady and walk as normally as possible.   OBJECTIVE:   LOWER EXTREMITY MMT:    MMT Right Eval Left Eval  Hip flexion 4- 3+  Hip extension    Hip abduction 4- 4+  Hip adduction 4- 3+  Knee flexion 4 4-  Knee extension 4 4  Ankle dorsiflexion 2+ 2+  Ankle plantarflexion 3+ 3+  (Blank rows = not tested)  FUNCTIONAL  TESTs:  5 times sit to stand: 19.6 seconds with walking stick; one LOB 10 meter walk test: 12 seconds Berg Balance Scale:  33/56  PATIENT SURVEYS:  ABC scale 67% FOTO 53%  TODAY'S TREATMENT:    TREATMENT:  Goals performed: see below for details      PATIENT EDUCATION: Education details: HEP, goals, POC Person educated: Patient Education method: Explanation, Demonstration, Tactile cues, Verbal cues, and Handouts Education comprehension: verbalized understanding, returned demonstration, verbal cues required, and tactile cues required  HOME EXERCISE PROGRAM: Access Code: [email or web address] URL: [email or web address] Date: 06/03/2022 Prepared by: Janna Arch  Exercises - Seated Heel Toe Raises  - 1 x daily - 7 x weekly - 2 sets - 10 reps - 5 hold - Standing Tandem Balance with Counter Support  - 1 x daily - 7 x weekly - 2 sets - 2 reps - 30 hold - Standing March with Counter Support  - 1 x daily - 7 x weekly - 2 sets - 10 reps - 5 hold   GOALS: Goals reviewed with patient? Yes   SHORT TERM GOALS: Target date: 07/01/2022  Patient will be independent in home exercise program to improve strength/mobility for better functional independence with ADLs. Baseline:10/9; HEP given 2/21: HEP compliant  Goal status: MET   LONG TERM GOALS: Target date: 02/03/2023    Patient will increase FOTO score to equal to or greater than   63%  to demonstrate statistically significant improvement in mobility and quality of life.  Baseline: 10/9: 53% 11/29: 58% 12/27: 51% 1/17: 56% 2/21: 53% 3/18: 60% Goal status: IN PROGRESS  2.  Patient (< 69 years old) will complete five times sit to stand test in < 10 seconds without UE support indicating an increased LE strength and improved balance. Baseline: 10/9: 19.6 seconds with walking stick; one LOB 11/29: 20.56 seconds 12/27: 13.4 seconds hands on knees 1/17: 13 seconds hands on knees  2/21: 14 seconds no hands 3/18: 12 seconds no  hands Goal status: IN PROGRESS  3.  Patient will increase Berg Balance score by > 45/56 to demonstrate decreased fall risk during functional activities. Baseline: 10/9: 33/56 [id number] 2/21: 46/56 3/18: 48/56  Goal status: MET  4.  Patient will increase 10 meter walk test to >1.53m/s as to improve gait speed for better community ambulation and to reduce fall risk. Baseline: 11/29: 1.19 m/s Goal status: MET  5.  Patient will increase ABC scale score >80% to demonstrate better functional mobility and better confidence with ADLs.  Baseline: 10/9: 67% 11/29: 75.6% 12/27: 78%  2/21: 78% 3/18: 80% Goal status: MET  6. Patient will tolerate 5 seconds of single leg stance without loss of balance to improve ability to get in and out of shower safely. Baseline: 11/29: unable to stand on single leg safely 12/27: 2 seconds 1/17: 3 seconds 2/21: 3 seconds 3/18: 4 seconds Goal status: IN PROGRESS   6. Patient will increase six minute walk test distance to >1000 for progression to community ambulator and improve gait ability Baseline: 12/27: 845 ft with with walking stick 1/17: 772ft 2/21: 930 ft  3/18: 950 ft Goal status: partially met  7. Patient will increase dynamic gait index score to >19/24 as to demonstrate reduced fall risk and improved dynamic gait balance for better safety with community/home ambulation.  Baseline: 3/18:  9/24 Goal status: NEW   ASSESSMENT:  CLINICAL IMPRESSION: Patient has made significant progress towards functional goals. He met his BERG and ABC goal allowing for new goal of DGI to be added to POC.  Patient is highly motivated for progression of stability and  mobility. He has made progress with 5x STS test, six minute walk test goal , FOTO, and single limb stance goal. He does continue to have fatigue and decreased stability with dynamic mobility as can be seen in DGI score. Pt will continue to benefit from skilled therapy to address remaining  deficits in order to improve overall QoL and return to PLOF.     OBJECTIVE IMPAIRMENTS Abnormal gait, decreased activity tolerance, decreased balance, decreased coordination, decreased endurance, decreased mobility, difficulty walking, decreased strength, impaired flexibility, impaired sensation, improper body mechanics, and pain.   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   REHAB POTENTIAL: Good  CLINICAL DECISION MAKING: Evolving/moderate complexity  EVALUATION COMPLEXITY: Moderate  PLAN: PT FREQUENCY: 2x/week  PT DURATION: 12 weeks  PLANNED INTERVENTIONS: Therapeutic exercises, Therapeutic activity, Neuromuscular re-education, Balance training, Gait training, Patient/Family education, Self Care, Joint mobilization, Stair training, Vestibular training, Canalith repositioning, Visual/preceptual remediation/compensation, DME instructions, Dry Needling, Cognitive remediation, Spinal mobilization, Cryotherapy, Moist heat, Manual lymph drainage, Compression bandaging, Taping, Vasopneumatic device, Ultrasound, Manual therapy, and Re-evaluation  PLAN FOR NEXT SESSION: Continue to progress dynamic balance and dual tasking balance. Work on single leg balance progressions.      Janna Arch PT  Physical Therapist- [facility]  11/11/22, 11:30 AM

## 2022-11-07 NOTE — Therapy (Signed)
OUTPATIENT OCCUPATIONAL THERAPY NEURO TREATMENT  Patient Name: Gregory Crane MRN: [id number] DOB:July 21, 1971, 52 y.o., male  PCP: Gregory Crane REFERRING PROVIDER: Einar Crane   END OF SESSION:  OT End of Session - 11/01/22 1959     Visit Number 3    Number of Visits 24    Date for OT Re-Evaluation 01/13/23    OT Start Time 1101    OT Stop Time 1150    OT Time Calculation (min) 49 min    Activity Tolerance Patient tolerated treatment well    Behavior During Therapy WFL for tasks assessed/performed             Past Medical History:  Diagnosis Date   Allergy    Anemia    Bell's palsy    Diabetes mellitus without complication ([name])    diet controlled   Hypertension    Hypothyroidism    Kidney stones    Pseudotumor cerebri    Stroke [name])    Past Surgical History:  Procedure Laterality Date   COLONOSCOPY WITH PROPOFOL N/A 03/30/2020   Procedure: COLONOSCOPY WITH PROPOFOL;  Surgeon: Lesly Rubenstein, MD;  Location: [facility] ENDOSCOPY;  Service: Endoscopy;  Laterality: N/A;   LOOP RECORDER INSERTION N/A 01/27/2018   Procedure: LOOP RECORDER INSERTION;  Surgeon: [name], MD;  Location: [place] [facility];  Service: Cardiovascular;  Laterality: N/A;   LUMBAR PUNCTURE     as child   NO PAST SURGERIES     TEE WITHOUT CARDIOVERSION N/A 01/07/2018   Procedure: TRANSESOPHAGEAL ECHOCARDIOGRAM (TEE);  Surgeon: Minna Merritts, MD;  Location: [facility] ORS;  Service: Cardiovascular;  Laterality: N/A;   Patient Active Problem List   Diagnosis Date Noted   Unsteady gait 10/05/2022   Hydronephrosis, left 05/04/2022   Type II diabetes mellitus with renal manifestations ([name]) 05/04/2022   Leukocytosis 05/04/2022   Pre-op evaluation 04/24/2022   Headache 07/07/2021   Hearing loss 07/07/2021   Open wound 01/25/2021   History of colon polyps 10/15/2020   Postoperative hemorrhage involving digestive system following digestive system procedure 09/19/2020   Acute  cholecystitis without calculus 09/09/2020   Type 2 diabetes mellitus, with long-term current use of insulin ([name]) 09/09/2020   Cryptogenic stroke ([name]) 07/13/2020   History of loop recorder 07/13/2020   Weakness 06/18/2020   History of 2019 novel coronavirus disease (COVID-19) 05/20/2020   ESRD (end stage renal disease) ([name]) 04/04/2020   Pneumonia due to COVID-19 virus 04/03/2020   AKI (acute kidney injury) ([name]) 04/03/2020   Elevated troponin 04/03/2020   Acquired trigger finger 06/08/2019   Lymphedema 06/08/2019   Anemia 04/17/2019   Swelling of left lower extremity 01/10/2019   Facial droop 04/09/2018   Daytime somnolence 03/30/2018   Carotid artery disease ([name]) 02/03/2018   Intracranial vascular stenosis 09/12/2017   Cough 01/20/2017   Bell's palsy 11/10/2016   History of CVA (cerebrovascular accident) 11/10/2016   Benign localized hyperplasia of prostate with urinary obstruction 10/27/2016   History of nephrolithiasis 10/27/2016   TIA (transient ischemic attack) 10/20/2016   Near syncope 06/23/2016   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   Neuropathy 08/06/2015   Health care maintenance 08/06/2015   Essential hypertension 08/06/2015   Heme positive stool 10/10/2013   Hypothyroidism 10/10/2013   Microalbuminuria 10/10/2013   Hyperlipidemia 10/10/2013   B12 deficiency 10/10/2013   Diabetes ([place]) 07/04/2013   Environmental allergies 07/04/2013    ONSET DATE: May 2023  REFERRING DIAG: CVA  THERAPY DIAG:  Muscle weakness (generalized)  Other  lack of coordination  Unsteadiness on feet  Rationale for Evaluation and Treatment: Rehabilitation  SUBJECTIVE:   SUBJECTIVE STATEMENT: 11/06/2022:  BP issues this past week with low blood pressure, MD adjusted med this week  No pain reported during session.  Pt accompanied by: self  PERTINENT HISTORY: Patient with history of CVA, banding of fistula in left arm with worsening of LUE functional use over time since May 2023, he  has been seeing PT for balance deficits.  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    PRECAUTIONS: Fall and Other: No strengthening exercises greater than 5-10 pounds in left arm.   Dialysis Tues, Th, Sat.  WEIGHT BEARING RESTRICTIONS: No  PAIN:  Are you having pain? No  FALLS: Has patient fallen in last 6 months? No  LIVING ENVIRONMENT: Lives with: alone Lives in: House/apartment Stairs: Yes: Internal: 15 steps; on right going up Has following equipment at home: Single point cane and Walker - 2 wheeled  PLOF: Independent  PATIENT GOALS: Pt reports he wants to have better use of his left hand, be able to do buttons and shoes.   OBJECTIVE:   HAND DOMINANCE: Right  ADLs: Overall ADLs: modified independent with most tasks Transfers/ambulation related to ADLs: Now walking without an assistive device, used cane in the past.  Eating: Independent with self feeding, difficulty with cutting food, requires increased focus and effort. Grooming: Independent UB Dressing: Difficulty with buttons, uses pullovers most of the time  LB Dressing: Difficulty with tying shoes Toileting: Independent Bathing: Independent Tub Shower transfers: Independent Equipment: none  IADLs: Shopping: independent,  uses a cart to push in the store Light housekeeping: modified independent Meal Prep: difficulty with tasks requiring use of left hand, bilateral hand use.   Community mobility: Ambulates without use of assistive device, able to drive Medication management: Independent Financial management: Independent Handwriting: 90% legible  MOBILITY STATUS: Needs Assist: Pt has progressed with PT, used to use a cane but ambulating without an assistive device but still has balance difficulties.    POSTURE COMMENTS:     Sitting balance: Sits without UE support up to 30 sec  ACTIVITY TOLERANCE: Activity tolerance: limited activity tolerance  FUNCTIONAL OUTCOME MEASURES: FOTO: 53  UPPER EXTREMITY ROM:    Active ROM Right eval Left eval  Shoulder flexion [facility]  Shoulder abduction [facility]  Shoulder adduction [facility]  Shoulder extension [facility]  Shoulder internal rotation [facility]  Shoulder external rotation [facility]  Elbow flexion [facility] WFL  Elbow extension [facility]  Wrist flexion [facility] WFL  Wrist extension [facility] WFL  Wrist ulnar deviation [facility] WFL  Wrist radial deviation WFL WFL  Wrist pronation [facility] WFL  Wrist supination WFL WFL  (Blank rows = not tested)  UPPER EXTREMITY MMT:     MMT Right eval Left eval  Shoulder flexion 5/5 3+/5  Shoulder abduction 5/5 3+/5  Shoulder adduction    Shoulder extension    Shoulder internal rotation    Shoulder external rotation    Middle trapezius    Lower trapezius    Elbow flexion 5/5 3+/5  Elbow extension 5/5 3+/5  Wrist flexion 5/5 3+/5  Wrist extension 5/5 3+/5  Wrist ulnar deviation 5/5 3+/5  Wrist radial deviation 5/5 3+/5  Wrist pronation 5/5 3+/5  Wrist supination 5/5 3+/5  (Blank rows = not tested)  Opposition  of thumb to all digits on right, on left hand opposition to ring finger but cannot demonstrate to small finger.   HAND FUNCTION: Grip strength: Right: 44 lbs; Left: 35 lbs, Lateral pinch: Right: 17 lbs, Left: 16 lbs, 3 point pinch: Right: 14 lbs, Left: 0 lbs, and Tip pinch: Right 2 lbs, Left: 0 lbs  COORDINATION: 9 Hole Peg test: Right: 36 sec; Left: 56 sec    TODAY'S TREATMENT:                                                                                                                              DATE: 11/06/2022  Therapeutic Exercises: Pt seen this date for focus on strengthening exercises with use of resistive green putty for left and right hand gripping patterns, difficulty with left hand moving putty in and around palm  of hand.  Lateral pinch, 3 point pinch and 2 point pinch.  Therapist demonstration and cues for grip and pinch patterns.  Neuromuscular Reeducation:  Pt seen this date with emphasis on coordination tasks with use of left hand for flipping cards, utilizing thumb and finger combinations, cues from therapist for prehension patterns, and speed/dexterity of task. Performing scatter pile with cards picking up cards from flat surface in ascending order within each suit, all 4 suits completed.  Occasional difficulty with attempts to pick up cards from flat surface.  Pt also demonstrates increased difficulty with shuffling of cards.   PATIENT EDUCATION: Education details: fine motor coordination, Advertising copywriter Person educated: Patient Education method: Explanation Education comprehension: verbalized understanding  HOME EXERCISE PROGRAM: Will provide HEP in 1-2 visits   GOALS: Goals reviewed with patient? Yes  SHORT TERM GOALS: Target date:  11/18/2022   Pt will demonstrate ability to hold and utilize utensils for cutting meat with modified independence. Baseline: difficulty with cutting, stabilizing item with fork for cutting. Goal status: INITIAL  2.  Pt will demonstrate ability to obtain keys from left pocket without difficult and without dropping.   Baseline: pt struggles with reaching into left pocket to get keys.   Goal status: INITIAL   LONG TERM GOALS: Target date: 01/13/2023   Pt will demonstrate HEP with modified independence.   Baseline: no current program for left UE.  Goal status: INITIAL  2.  Pt will improve left UE coordination by 10 secs on 9 hole peg test to demonstrate shoe tying with modified independence.  Baseline: currently wearing slip on shoes and unable to tie shoes, left 9 hole peg test in 56 sec at eval  Goal status: INITIAL  3.  Pt will demonstrate improved coordination in left hand to demonstrate buttoning buttons on clothing with modified  independence.  Baseline: difficulty with buttons and has been wearing more pullovers to avoid challenge of buttons. Goal status: INITIAL  4.  Pt will demonstrate improvement in left grip strength by 5# to open jars and containers with modified independence and greater ease.   Baseline: Difficulty  with opening jars and containers with use of left hand at eval.  Goal status: INITIAL  5.  Pt will improve left UE strength by 1 mm grade to hold and hand wash dishes with modified independence.  Baseline: difficulty with being able to hand wash dishes at eval, difficulty stabilizing dish. Goal status: INITIAL  6.  Pt will demonstrate FOTO score of 59 or greater to show a clinically relevant change in LUE to impact greater independence with daily ADL and IADL tasks at home and in the community.   Baseline:  Goal status: INITIAL  ASSESSMENT:  CLINICAL IMPRESSION: Pt demonstrates decreased strength of hands bilaterally, left hand weaker than right.  Coordination remains impaired in the left hand and noted when attempting to move putty around in hand for gripping exercises.  Pt demonstrating difficulty with coordination tasks with attempts to shuffle cards, able to work towards thumb/finger combinations for flipping cards, slow and deliberate motions for this task and required increased focus from patient.  Difficulty noted with picking up cards from flat surface at times.  Continue to work towards goals in plan of care to improve strength and coordination of bilateral UEs with emphasis on left hand.    PERFORMANCE DEFICITS: in functional skills including ADLs, IADLs, coordination, dexterity, sensation, tone, ROM, strength, flexibility, Fine motor control, balance, endurance, decreased knowledge of use of DME, and UE functional use, cognitive skills including, and psychosocial skills including environmental adaptation, habits, and routines and behaviors.   IMPAIRMENTS: are limiting patient from ADLs,  IADLs, and social participation.   CO-MORBIDITIES: has co-morbidities such as CAD, ESRD with dialysis, deafness in right ear, diabetic retinopathy, HTN, neuropathy  that affects occupational performance. Patient will benefit from skilled OT to address above impairments and improve overall function.  MODIFICATION OR ASSISTANCE TO COMPLETE EVALUATION: Min-Moderate modification of tasks or assist with assess necessary to complete an evaluation.  OT OCCUPATIONAL PROFILE AND HISTORY: Comprehensive assessment: Review of records and extensive additional review of physical, cognitive, psychosocial history related to current functional performance.  CLINICAL DECISION MAKING: Moderate - several treatment options, min-mod task modification necessary  REHAB POTENTIAL: Good  EVALUATION COMPLEXITY: Moderate    PLAN:  OT FREQUENCY: 2x/week  OT DURATION: 12 weeks  PLANNED INTERVENTIONS: self care/ADL training, therapeutic exercise, therapeutic activity, neuromuscular re-education, manual therapy, balance training, paraffin, moist heat, contrast bath, patient/family education, and DME and/or AE instructions  RECOMMENDED OTHER SERVICES: Pt is being seen by PT currently.   CONSULTED AND AGREED WITH PLAN OF CARE: Patient  PLAN FOR NEXT SESSION: Initiate HEP for grip, pinch and coordination skills.   Jamae Tison T Tomasita Morrow, OTR/L, CLT  Brylea Pita, OT 11/01/2022, 8:10 PM

## 2022-11-11 ENCOUNTER — Other Ambulatory Visit: Payer: Self-pay | Admitting: Internal Medicine

## 2022-11-11 ENCOUNTER — Ambulatory Visit: Payer: Medicare Other

## 2022-11-11 DIAGNOSIS — R269 Unspecified abnormalities of gait and mobility: Secondary | ICD-10-CM

## 2022-11-11 DIAGNOSIS — M6281 Muscle weakness (generalized): Secondary | ICD-10-CM | POA: Diagnosis not present

## 2022-11-11 DIAGNOSIS — R2681 Unsteadiness on feet: Secondary | ICD-10-CM

## 2022-11-11 DIAGNOSIS — N183 Chronic kidney disease, stage 3 unspecified: Secondary | ICD-10-CM

## 2022-11-11 DIAGNOSIS — R262 Difficulty in walking, not elsewhere classified: Secondary | ICD-10-CM

## 2022-11-13 ENCOUNTER — Ambulatory Visit: Payer: Medicare Other

## 2022-11-13 NOTE — Telephone Encounter (Signed)
Has seen endocrinology.  Need to confirm who is refilling medication.  If they are adjusting dose, can see if can get refill from endo - or need to confirm dose taking.  Let me know if any problems.

## 2022-11-15 ENCOUNTER — Ambulatory Visit: Admission: RE | Admit: 2022-11-15 | Payer: Medicare Other | Source: Ambulatory Visit

## 2022-11-17 ENCOUNTER — Other Ambulatory Visit: Payer: Self-pay | Admitting: Internal Medicine

## 2022-11-18 ENCOUNTER — Ambulatory Visit: Payer: Medicare Other | Admitting: Physical Therapy

## 2022-11-18 ENCOUNTER — Other Ambulatory Visit (INDEPENDENT_AMBULATORY_CARE_PROVIDER_SITE_OTHER): Payer: Medicare Other

## 2022-11-18 DIAGNOSIS — E039 Hypothyroidism, unspecified: Secondary | ICD-10-CM

## 2022-11-18 DIAGNOSIS — R2681 Unsteadiness on feet: Secondary | ICD-10-CM

## 2022-11-18 DIAGNOSIS — R262 Difficulty in walking, not elsewhere classified: Secondary | ICD-10-CM

## 2022-11-18 DIAGNOSIS — E785 Hyperlipidemia, unspecified: Secondary | ICD-10-CM

## 2022-11-18 DIAGNOSIS — R269 Unspecified abnormalities of gait and mobility: Secondary | ICD-10-CM

## 2022-11-18 DIAGNOSIS — M6281 Muscle weakness (generalized): Secondary | ICD-10-CM

## 2022-11-18 DIAGNOSIS — R278 Other lack of coordination: Secondary | ICD-10-CM

## 2022-11-18 NOTE — Therapy (Signed)
OUTPATIENT PHYSICAL THERAPY NEURO TREATMENT/RECERT   Patient Name: Gregory Crane MRN: [id number] DOB:10/24/1970, 52 y.o., male Today's Date: 11/18/2022  PCP: [name] MD REFERRING PROVIDER: [name] MD  PT End of Session - 11/18/22 0819     Visit Number 37    Number of Visits 60    Date for PT Re-Evaluation 02/03/23    Authorization Type Medicare A&B; Medicaid; 09/11/22    Authorization Time Period 08/21/22-11/13/22    Progress Note Due on Visit 40    PT Start Time 0817    PT Stop Time 0858    PT Time Calculation (min) 41 min    Equipment Utilized During Treatment Gait belt    Activity Tolerance Patient tolerated treatment well;No increased pain;Patient limited by fatigue    Behavior During Therapy [facility] for tasks assessed/performed                  Past Medical History:  Diagnosis Date   Allergy    Anemia    Bell's palsy    Diabetes mellitus without complication ([place])    diet controlled   Hypertension    Hypothyroidism    Kidney stones    Pseudotumor cerebri    Stroke [facility])    Past Surgical History:  Procedure Laterality Date   COLONOSCOPY WITH PROPOFOL N/A 03/30/2020   Procedure: COLONOSCOPY WITH PROPOFOL;  Surgeon: Lesly Rubenstein, MD;  Location: [facility] ENDOSCOPY;  Service: Endoscopy;  Laterality: N/A;   LOOP RECORDER INSERTION N/A 01/27/2018   Procedure: LOOP RECORDER INSERTION;  Surgeon: [name], MD;  Location: [place] CV LAB;  Service: Cardiovascular;  Laterality: N/A;   LUMBAR PUNCTURE     as child   NO PAST SURGERIES     TEE WITHOUT CARDIOVERSION N/A 01/07/2018   Procedure: TRANSESOPHAGEAL ECHOCARDIOGRAM (TEE);  Surgeon: Minna Merritts, MD;  Location: [facility] ORS;  Service: Cardiovascular;  Laterality: N/A;   Patient Active Problem List   Diagnosis Date Noted   Unsteady gait 10/05/2022   Hydronephrosis, left 05/04/2022   Type II diabetes mellitus with renal manifestations ([place]) 05/04/2022   Leukocytosis 05/04/2022    Pre-op evaluation 04/24/2022   Headache 07/07/2021   Hearing loss 07/07/2021   Open wound 01/25/2021   History of colon polyps 10/15/2020   Postoperative hemorrhage involving digestive system following digestive system procedure 09/19/2020   Acute cholecystitis without calculus 09/09/2020   Type 2 diabetes mellitus, with long-term current use of insulin ([place]) 09/09/2020   Cryptogenic stroke ([place]) 07/13/2020   History of loop recorder 07/13/2020   Weakness 06/18/2020   History of 2019 novel coronavirus disease (COVID-19) 05/20/2020   ESRD (end stage renal disease) ([place]) 04/04/2020   Pneumonia due to COVID-19 virus 04/03/2020   AKI (acute kidney injury) ([place]) 04/03/2020   Elevated troponin 04/03/2020   Acquired trigger finger 06/08/2019   Lymphedema 06/08/2019   Anemia 04/17/2019   Swelling of left lower extremity 01/10/2019   Facial droop 04/09/2018   Daytime somnolence 03/30/2018   Carotid artery disease ([place]) 02/03/2018   Intracranial vascular stenosis 09/12/2017   Cough 01/20/2017   Bell's palsy 11/10/2016   History of CVA (cerebrovascular accident) 11/10/2016   Benign localized hyperplasia of prostate with urinary obstruction 10/27/2016   History of nephrolithiasis 10/27/2016   TIA (transient ischemic attack) 10/20/2016   Near syncope 06/23/2016   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   Neuropathy 08/06/2015   Health care maintenance 08/06/2015   Essential hypertension 08/06/2015   Heme positive stool 10/10/2013  Hypothyroidism 10/10/2013   Microalbuminuria 10/10/2013   Hyperlipidemia 10/10/2013   B12 deficiency 10/10/2013   Diabetes ([place]) 07/04/2013   Environmental allergies 07/04/2013   ONSET DATE: 2-3 years  REFERRING DIAG: Neuropathy  THERAPY DIAG:  Unsteadiness on feet  Muscle weakness (generalized)  Difficulty in walking, not elsewhere classified  Abnormality of gait and mobility  Other lack of coordination  Rationale for Evaluation and Treatment  Rehabilitation  SUBJECTIVE:                                                                                                                                                                                             SUBJECTIVE STATEMENT: Pt was slightly late to PT appointment, but apologetic. Reports no pain and no other new updates since last PT treatment   Pt accompanied by: self  PERTINENT HISTORY: Patient presents to physical therapy for neuropathy. He underwent banding of his L UE fistula on 05/13/22 and scheduled for additional banding on 06/07/22. 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 Patient reports his balance is very unsteady due to neuropathy in feet, feels weak in LE's.   PAIN:  Are you having pain? Yes: NPRS scale: 0/10 Pain location: L foot Pain description: sharp  Aggravating factors: worse at night Relieving factors: lyrica   PRECAUTIONS: Fall  WEIGHT BEARING RESTRICTIONS Yes no lifting >5 lb in arm   FALLS: Has patient fallen in last 6 months? No  LIVING ENVIRONMENT: Lives with: lives alone Lives in: House/apartment Stairs: Yes: Internal: flight steps; on right going up Has following equipment at home: Single point cane and Walker - 2 wheeled  PLOF: Independent  PATIENT GOALS to be more steady and walk as normally as possible.   OBJECTIVE:   LOWER EXTREMITY MMT:    MMT Right Eval Left Eval  Hip flexion 4- 3+  Hip extension    Hip abduction 4- 4+  Hip adduction 4- 3+  Knee flexion 4 4-  Knee extension 4 4  Ankle dorsiflexion 2+ 2+  Ankle plantarflexion 3+ 3+  (Blank rows = not tested)  FUNCTIONAL TESTs:  5 times sit to stand: 19.6 seconds with walking stick; one LOB 10 meter walk test: 12 seconds Berg Balance Scale: 33/56  PATIENT SURVEYS:  ABC scale  67% FOTO 53%  TODAY'S TREATMENT:    TREATMENT:   Nustep BLE strength and endurance training. Cues to keep hips in neutral hip ER/IR level 3 x 5 min   Lateral step up to 5"step 2  x 8 bil.  Forward step up to 5" step 2 x8 bil.   Dynamic gait training on cable forward 3 x 64ft, side stepping R and L  15 ft x 2 bil. Reverse gait 3 x 54ft.   Throughout session, pt required multiple seated rest breaks due to fatigue and BLE weakness. Min assist for all balance/strengthening exercises with noted increased difficulty with L sided tasks. Mild staggering gait upon completion of PT today due to BLE weakness. Pt abel to correct LOB without additional support.    PATIENT EDUCATION: Education details: HEP, goals, POC Person educated: Patient Education method: Explanation, Demonstration, Tactile cues, Verbal cues, and Handouts Education comprehension: verbalized understanding, returned demonstration, verbal cues required, and tactile cues required  HOME EXERCISE PROGRAM: Access Code: [email or web address] URL: [email or web address] Date: 06/03/2022 Prepared by: Janna Arch  Exercises - Seated Heel Toe Raises  - 1 x daily - 7 x weekly - 2 sets - 10 reps - 5 hold - Standing Tandem Balance with Counter Support  - 1 x daily - 7 x weekly - 2 sets - 2 reps - 30 hold - Standing March with Counter Support  - 1 x daily - 7 x weekly - 2 sets - 10 reps - 5 hold   GOALS: Goals reviewed with patient? Yes   SHORT TERM GOALS: Target date: 07/01/2022  Patient will be independent in home exercise program to improve strength/mobility for better functional independence with ADLs. Baseline:10/9; HEP given 2/21: HEP compliant  Goal status: MET   LONG TERM GOALS: Target date: 02/03/2023    Patient will increase FOTO score to equal to or greater than   63%  to demonstrate statistically significant improvement in mobility and quality of life.  Baseline: 10/9: 53% 11/29: 58% 12/27: 51% 1/17: 56% 2/21:  53% 3/18: 60% Goal status: IN PROGRESS  2.  Patient (< 106 years old) will complete five times sit to stand test in < 10 seconds without UE support indicating an increased LE strength and improved balance. Baseline: 10/9: 19.6 seconds with walking stick; one LOB 11/29: 20.56 seconds 12/27: 13.4 seconds hands on knees 1/17: 13 seconds hands on knees  2/21: 14 seconds no hands 3/18: 12 seconds no hands Goal status: IN PROGRESS  3.  Patient will increase Berg Balance score by > 45/56 to demonstrate decreased fall risk during functional activities. Baseline: 10/9: 33/56 [id number] 2/21: 46/56 3/18: 48/56  Goal status: MET  4.  Patient will increase 10 meter walk test to >1.25m/s as to improve gait speed for better community ambulation and to reduce fall risk. Baseline: 11/29: 1.19 m/s Goal status: MET  5.  Patient will increase ABC scale score >80% to demonstrate better functional mobility and better confidence with ADLs.  Baseline: 10/9: 67% 11/29: 75.6% 12/27: 78%  2/21: 78% 3/18: 80% Goal status: MET  6. Patient will tolerate 5 seconds of single leg stance without loss of balance to improve ability to get in and out of shower safely. Baseline: 11/29: unable to stand on single leg safely 12/27: 2 seconds 1/17: 3 seconds 2/21: 3 seconds 3/18: 4 seconds Goal status: IN PROGRESS   6. Patient will increase six minute walk test distance to >1000 for progression to community ambulator and improve gait ability Baseline: 12/27: 845 ft with with walking stick 1/17: 759ft 2/21: 930 ft  3/18: 950 ft Goal status: partially met  7. Patient will increase dynamic gait index score to >19/24 as to demonstrate  reduced fall risk and improved dynamic gait balance for better safety with community/home ambulation.  Baseline: 3/18:  9/24 Goal status: NEW   ASSESSMENT:  CLINICAL IMPRESSION: Pt put forth good effort towards all PT interventions for BLE strengthening and balance.  Noted to have decreased strength on the LLE but able to correct all LOB with increased time and supervision assist from PT.   Pt will continue to benefit from skilled therapy to address remaining deficits in order to improve overall QoL and return to PLOF.     OBJECTIVE IMPAIRMENTS Abnormal gait, decreased activity tolerance, decreased balance, decreased coordination, decreased endurance, decreased mobility, difficulty walking, decreased strength, impaired flexibility, impaired sensation, improper body mechanics, and pain.   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   REHAB POTENTIAL: Good  CLINICAL DECISION MAKING: Evolving/moderate complexity  EVALUATION COMPLEXITY: Moderate  PLAN: PT FREQUENCY: 2x/week  PT DURATION: 12 weeks  PLANNED INTERVENTIONS: Therapeutic exercises, Therapeutic activity, Neuromuscular re-education, Balance training, Gait training, Patient/Family education, Self Care, Joint mobilization, Stair training, Vestibular training, Canalith repositioning, Visual/preceptual remediation/compensation, DME instructions, Dry Needling, Cognitive remediation, Spinal mobilization, Cryotherapy, Moist heat, Manual lymph drainage, Compression bandaging, Taping,  Vasopneumatic device, Ultrasound, Manual therapy, and Re-evaluation  PLAN FOR NEXT SESSION:   Variable gait training. Continue to progress dynamic balance and dual tasking balance. Work on single leg balance progressions.     [facility]  11/18/22, 9:14 AM

## 2022-11-19 ENCOUNTER — Ambulatory Visit: Payer: Medicare Other

## 2022-11-19 LAB — HEPATIC FUNCTION PANEL
ALT: 9 U/L (ref 0–53)
AST: 12 U/L (ref 0–37)
Albumin: 4.1 g/dL (ref 3.5–5.2)
Alkaline Phosphatase: 75 U/L (ref 39–117)
Bilirubin, Direct: 0.1 mg/dL (ref 0.0–0.3)
Total Bilirubin: 0.5 mg/dL (ref 0.2–1.2)
Total Protein: 7 g/dL (ref 6.0–8.3)

## 2022-11-19 LAB — TSH: TSH: 5.02 u[IU]/mL (ref 0.35–5.50)

## 2022-11-20 ENCOUNTER — Ambulatory Visit: Payer: Medicare Other

## 2022-11-20 ENCOUNTER — Ambulatory Visit: Payer: Medicare Other | Admitting: Physical Therapy

## 2022-11-20 DIAGNOSIS — M6281 Muscle weakness (generalized): Secondary | ICD-10-CM

## 2022-11-20 DIAGNOSIS — R278 Other lack of coordination: Secondary | ICD-10-CM

## 2022-11-20 DIAGNOSIS — R269 Unspecified abnormalities of gait and mobility: Secondary | ICD-10-CM

## 2022-11-20 DIAGNOSIS — R2681 Unsteadiness on feet: Secondary | ICD-10-CM

## 2022-11-20 DIAGNOSIS — R262 Difficulty in walking, not elsewhere classified: Secondary | ICD-10-CM

## 2022-11-20 NOTE — Telephone Encounter (Signed)
Was trying to reach patient regarding insulin dose/endo appt. When calling for lab results today, he confirmed that he has not seen endo recently and does not have follow up. He is using 10-15 units of Lantus and he does sliding scale for his humalog 4-12 units TID. He is not out of medication but is unsure if he has refills left.

## 2022-11-20 NOTE — Telephone Encounter (Signed)
Ok to refill with directions - he has provided - if that is how he is giving.

## 2022-11-20 NOTE — Therapy (Signed)
OUTPATIENT PHYSICAL THERAPY NEURO TREATMENT   Patient Name: Gregory Crane MRN: [id number] DOB:[birth year], 52 y.o., male Today's Date: 11/20/2022  PCP: [name] MD REFERRING PROVIDER: [name] MD  PT End of Session - 11/20/22 0806     Visit Number 38    Number of Visits 60    Date for PT Re-Evaluation 02/03/23    Authorization Type Medicare A&B; Medicaid; 09/11/22    Authorization Time Period 08/21/22-11/13/22    Progress Note Due on Visit 74    PT Start Time 0802    PT Stop Time 0845    PT Time Calculation (min) 43 min    Equipment Utilized During Treatment Gait belt    Activity Tolerance Patient tolerated treatment well;No increased pain;Patient limited by fatigue    Behavior During Therapy [facility] for tasks assessed/performed                  Past Medical History:  Diagnosis Date   Allergy    Anemia    Bell's palsy    Diabetes mellitus without complication ([place])    diet controlled   Hypertension    Hypothyroidism    Kidney stones    Pseudotumor cerebri    Stroke [facility])    Past Surgical History:  Procedure Laterality Date   COLONOSCOPY WITH PROPOFOL N/A 03/30/2020   Procedure: COLONOSCOPY WITH PROPOFOL;  Surgeon: Lesly Rubenstein, MD;  Location: [facility] ENDOSCOPY;  Service: Endoscopy;  Laterality: N/A;   LOOP RECORDER INSERTION N/A 01/27/2018   Procedure: LOOP RECORDER INSERTION;  Surgeon: [name], MD;  Location: [place] [facility];  Service: Cardiovascular;  Laterality: N/A;   LUMBAR PUNCTURE     as child   NO PAST SURGERIES     TEE WITHOUT CARDIOVERSION N/A 01/07/2018   Procedure: TRANSESOPHAGEAL ECHOCARDIOGRAM (TEE);  Surgeon: Minna Merritts, MD;  Location: [facility] ORS;  Service: Cardiovascular;  Laterality: N/A;   Patient Active Problem List   Diagnosis Date Noted   Unsteady gait 10/05/2022   Hydronephrosis, left 05/04/2022   Type II diabetes mellitus with renal manifestations ([place]) 05/04/2022   Leukocytosis 05/04/2022   Pre-op  evaluation 04/24/2022   Headache 07/07/2021   Hearing loss 07/07/2021   Open wound 01/25/2021   History of colon polyps 10/15/2020   Postoperative hemorrhage involving digestive system following digestive system procedure 09/19/2020   Acute cholecystitis without calculus 09/09/2020   Type 2 diabetes mellitus, with long-term current use of insulin ([place]) 09/09/2020   Cryptogenic stroke ([place]) 07/13/2020   History of loop recorder 07/13/2020   Weakness 06/18/2020   History of 2019 novel coronavirus disease (COVID-19) 05/20/2020   ESRD (end stage renal disease) ([place]) 04/04/2020   Pneumonia due to COVID-19 virus 04/03/2020   AKI (acute kidney injury) ([place]) 04/03/2020   Elevated troponin 04/03/2020   Acquired trigger finger 06/08/2019   Lymphedema 06/08/2019   Anemia 04/17/2019   Swelling of left lower extremity 01/10/2019   Facial droop 04/09/2018   Daytime somnolence 03/30/2018   Carotid artery disease ([place]) 02/03/2018   Intracranial vascular stenosis 09/12/2017   Cough 01/20/2017   Bell's palsy 11/10/2016   History of CVA (cerebrovascular accident) 11/10/2016   Benign localized hyperplasia of prostate with urinary obstruction 10/27/2016   History of nephrolithiasis 10/27/2016   TIA (transient ischemic attack) 10/20/2016   Near syncope 06/23/2016   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   Neuropathy 08/06/2015   Health care maintenance 08/06/2015   Essential hypertension 08/06/2015   Heme positive stool 10/10/2013  Hypothyroidism 10/10/2013   Microalbuminuria 10/10/2013   Hyperlipidemia 10/10/2013   B12 deficiency 10/10/2013   Diabetes ([place]) 07/04/2013   Environmental allergies 07/04/2013   ONSET DATE: 2-3 years  REFERRING DIAG: Neuropathy  THERAPY DIAG:  Unsteadiness on feet  Muscle weakness (generalized)  Difficulty in walking, not elsewhere classified  Abnormality of gait and mobility  Other lack of coordination  Rationale for Evaluation and Treatment  Rehabilitation  SUBJECTIVE:                                                                                                                                                                                             SUBJECTIVE STATEMENT: Pt reports that he is doing well. Wants to continue to improve balance and strength to return to PLOF before balance deficits began.     Pt accompanied by: self  PERTINENT HISTORY: Patient presents to physical therapy for neuropathy. He underwent banding of his L UE fistula on 05/13/22 and scheduled for additional banding on 06/07/22. 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 Patient reports his balance is very unsteady due to neuropathy in feet, feels weak in LE's.   PAIN:  Are you having pain? Yes: NPRS scale: 0/10 Pain location: L foot Pain description: sharp  Aggravating factors: worse at night Relieving factors: lyrica   PRECAUTIONS: Fall  WEIGHT BEARING RESTRICTIONS Yes no lifting >5 lb in arm   FALLS: Has patient fallen in last 6 months? No  LIVING ENVIRONMENT: Lives with: lives alone Lives in: House/apartment Stairs: Yes: Internal: flight steps; on right going up Has following equipment at home: Single point cane and Walker - 2 wheeled  PLOF: Independent  PATIENT GOALS to be more steady and walk as normally as possible.   OBJECTIVE:   LOWER EXTREMITY MMT:    MMT Right Eval Left Eval  Hip flexion 4- 3+  Hip extension    Hip abduction 4- 4+  Hip adduction 4- 3+  Knee flexion 4 4-  Knee extension 4 4  Ankle dorsiflexion 2+ 2+  Ankle plantarflexion 3+ 3+  (Blank rows = not tested)  FUNCTIONAL TESTs:  5 times sit to stand: 19.6 seconds with walking stick; one LOB 10 meter walk test: 12 seconds Berg Balance Scale: 33/56  PATIENT SURVEYS:   ABC scale 67% FOTO 53%  TODAY'S TREATMENT:    TREATMENT:   Nustep BLE strength and endurance training. Cues to keep hips in neutral hip ER/IR level 3 x 4 min   Forward reverse  gait no resistance 2 x 7ft then performed with 5# ankle weights Bil, 4 x 15 ft.  Side stepping R and L 30ft x 5 bil with 5# ankle weights.   Standing at rail on wall.  Hip abduction 5# ankle weight 2x12 Standing march 5# ankle weight 2x 12  Hip extension 5# ankle weight 2x 12  SLS 2 x 10sec bil with intermittent UE support on rail at wall.   CGA for balance and safety with dynamic gait training and SLS to reduce fall risk and improve weight shift R and L   PATIENT EDUCATION: Education details: HEP, goals, POC. Pt educated throughout session about proper posture and technique with exercises. Improved exercise technique, movement at target joints, use of target muscles after min to mod verbal, visual, tactile cues.  Person educated: Patient Education method: Explanation, Demonstration, Tactile cues, Verbal cues, and Handouts Education comprehension: verbalized understanding, returned demonstration, verbal cues required, and tactile cues required  HOME EXERCISE PROGRAM: Access Code: [email or web address] URL: [email or web address] Date: 06/03/2022 Prepared by: Janna Arch  Exercises - Seated Heel Toe Raises  - 1 x daily - 7 x weekly - 2 sets - 10 reps - 5 hold - Standing Tandem Balance with Counter Support  - 1 x daily - 7 x weekly - 2 sets - 2 reps - 30 hold - Standing March with Counter Support  - 1 x daily - 7 x weekly - 2 sets - 10 reps - 5 hold   GOALS: Goals reviewed with patient? Yes   SHORT TERM GOALS: Target date: 07/01/2022  Patient will be independent in home exercise program to improve strength/mobility for better functional independence with ADLs. Baseline:10/9; HEP given 2/21: HEP compliant  Goal status: MET   LONG TERM GOALS: Target date: 02/03/2023    Patient will increase  FOTO score to equal to or greater than   63%  to demonstrate statistically significant improvement in mobility and quality of life.  Baseline: 10/9: 53% 11/29: 58% 12/27: 51% 1/17: 56% 2/21: 53% 3/18: 60% Goal status: IN PROGRESS  2.  Patient (< 36 years old) will complete five times sit to stand test in < 10 seconds without UE support indicating an increased LE strength and improved balance. Baseline: 10/9: 19.6 seconds with walking stick; one LOB 11/29: 20.56 seconds 12/27: 13.4 seconds hands on knees 1/17: 13 seconds hands on knees  2/21: 14 seconds no hands 3/18: 12 seconds no hands Goal status: IN PROGRESS  3.  Patient will increase Berg Balance score by > 45/56 to demonstrate decreased fall risk during functional activities. Baseline: 10/9: 33/56 [id number] 2/21: 46/56 3/18: 48/56  Goal status: MET  4.  Patient will increase 10 meter walk test to >1.12m/s as to improve gait speed for better community ambulation and to reduce fall risk. Baseline: 11/29: 1.19 m/s Goal status: MET  5.  Patient will increase ABC scale score >80% to demonstrate better functional mobility and better confidence with ADLs.  Baseline: 10/9: 67% 11/29: 75.6% 12/27: 78%  2/21: 78% 3/18: 80% Goal status: MET  6. Patient will tolerate 5 seconds of single leg stance without loss of balance to improve ability to get in and out of shower safely. Baseline: 11/29: unable to stand on single leg safely 12/27: 2 seconds 1/17: 3 seconds 2/21: 3 seconds 3/18: 4 seconds Goal status: IN PROGRESS   6. Patient will increase six minute walk test distance to >1000 for progression to community ambulator and  improve gait ability Baseline: 12/27: 845 ft with with walking stick 1/17: 753ft 2/21: 930 ft  3/18: 950 ft Goal status: partially met  7. Patient will increase dynamic gait index score to >19/24 as to demonstrate reduced fall risk and improved dynamic gait balance for better safety with  community/home ambulation.  Baseline: 3/18:  9/24 Goal status: NEW   ASSESSMENT:  CLINICAL IMPRESSION: Pt put forth excellent effort towards all PT interventions for BLE strengthening and balance. Mild hip instability noted on the LLE>RLE with dynamic gait training, but no overt LOB. Pt able to demonstrate improved step length symmetry and decreased force of heel ontact on the LLE with instruction from PT upon completion of intervention on this day.  Pt will continue to benefit from skilled therapy to address remaining deficits in order to improve overall QoL and return to PLOF.     OBJECTIVE IMPAIRMENTS Abnormal gait, decreased activity tolerance, decreased balance, decreased coordination, decreased endurance, decreased mobility, difficulty walking, decreased strength, impaired flexibility, impaired sensation, improper body mechanics, and pain.   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   REHAB POTENTIAL: Good  CLINICAL DECISION MAKING: Evolving/moderate complexity  EVALUATION COMPLEXITY: Moderate  PLAN: PT FREQUENCY: 2x/week  PT DURATION: 12 weeks  PLANNED INTERVENTIONS: Therapeutic exercises, Therapeutic activity, Neuromuscular re-education, Balance training, Gait  training, Patient/Family education, Self Care, Joint mobilization, Stair training, Vestibular training, Canalith repositioning, Visual/preceptual remediation/compensation, DME instructions, Dry Needling, Cognitive remediation, Spinal mobilization, Cryotherapy, Moist heat, Manual lymph drainage, Compression bandaging, Taping, Vasopneumatic device, Ultrasound, Manual therapy, and Re-evaluation  PLAN FOR NEXT SESSION:   BLE strengthening and endurance. Continue to progress dynamic balance and dual tasking balance. Work on single leg balance progressions.     [facility]  11/20/22, 9:52 AM

## 2022-11-21 ENCOUNTER — Ambulatory Visit: Payer: Medicare Other

## 2022-11-21 NOTE — Therapy (Signed)
OUTPATIENT OCCUPATIONAL THERAPY NEURO TREATMENT  Patient Name: Gregory Crane MRN: [id number] DOB:05-Oct-1970, 52 y.o., male  PCP: [name] REFERRING PROVIDER: [name]   END OF SESSION:  OT End of Session - 11/20/22 0849     Visit Number 5    Number of Visits 24    Date for OT Re-Evaluation 01/13/23    OT Start Time 0845    OT Stop Time 0930    OT Time Calculation (min) 45 min    Activity Tolerance Patient tolerated treatment well    Behavior During Therapy [place] for tasks assessed/performed             Past Medical History:  Diagnosis Date   Allergy    Anemia    Bell's palsy    Diabetes mellitus without complication ([place])    diet controlled   Hypertension    Hypothyroidism    Kidney stones    Pseudotumor cerebri    Stroke [facility])    Past Surgical History:  Procedure Laterality Date   COLONOSCOPY WITH PROPOFOL N/A 03/30/2020   Procedure: COLONOSCOPY WITH PROPOFOL;  Surgeon: Lesly Rubenstein, MD;  Location: [facility] ENDOSCOPY;  Service: Endoscopy;  Laterality: N/A;   LOOP RECORDER INSERTION N/A 01/27/2018   Procedure: LOOP RECORDER INSERTION;  Surgeon: [name], MD;  Location: [place] CV LAB;  Service: Cardiovascular;  Laterality: N/A;   LUMBAR PUNCTURE     as child   NO PAST SURGERIES     TEE WITHOUT CARDIOVERSION N/A 01/07/2018   Procedure: TRANSESOPHAGEAL ECHOCARDIOGRAM (TEE);  Surgeon: Minna Merritts, MD;  Location: [facility] ORS;  Service: Cardiovascular;  Laterality: N/A;   Patient Active Problem List   Diagnosis Date Noted   Unsteady gait 10/05/2022   Hydronephrosis, left 05/04/2022   Type II diabetes mellitus with renal manifestations ([place]) 05/04/2022   Leukocytosis 05/04/2022   Pre-op evaluation 04/24/2022   Headache 07/07/2021   Hearing loss 07/07/2021   Open wound 01/25/2021   History of colon polyps 10/15/2020   Postoperative hemorrhage involving digestive system following digestive system procedure 09/19/2020   Acute  cholecystitis without calculus 09/09/2020   Type 2 diabetes mellitus, with long-term current use of insulin ([place]) 09/09/2020   Cryptogenic stroke ([place]) 07/13/2020   History of loop recorder 07/13/2020   Weakness 06/18/2020   History of 2019 novel coronavirus disease (COVID-19) 05/20/2020   ESRD (end stage renal disease) ([place]) 04/04/2020   Pneumonia due to COVID-19 virus 04/03/2020   AKI (acute kidney injury) ([place]) 04/03/2020   Elevated troponin 04/03/2020   Acquired trigger finger 06/08/2019   Lymphedema 06/08/2019   Anemia 04/17/2019   Swelling of left lower extremity 01/10/2019   Facial droop 04/09/2018   Daytime somnolence 03/30/2018   Carotid artery disease ([place]) 02/03/2018   Intracranial vascular stenosis 09/12/2017   Cough 01/20/2017   Bell's palsy 11/10/2016   History of CVA (cerebrovascular accident) 11/10/2016   Benign localized hyperplasia of prostate with urinary obstruction 10/27/2016   History of nephrolithiasis 10/27/2016   TIA (transient ischemic attack) 10/20/2016   Near syncope 06/23/2016   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   Neuropathy 08/06/2015   Health care maintenance 08/06/2015   Essential hypertension 08/06/2015   Heme positive stool 10/10/2013   Hypothyroidism 10/10/2013   Microalbuminuria 10/10/2013   Hyperlipidemia 10/10/2013   B12 deficiency 10/10/2013   Diabetes ([place]) 07/04/2013   Environmental allergies 07/04/2013    ONSET DATE: May 2023  REFERRING DIAG: CVA  THERAPY DIAG:  Muscle weakness (generalized)  Other  lack of coordination  Rationale for Evaluation and Treatment: Rehabilitation  SUBJECTIVE:  SUBJECTIVE STATEMENT: Pt reports doing well today.  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    PRECAUTIONS: Fall and Other: No strengthening exercises greater than 5-10 pounds in left arm.   Dialysis Tues, Th, Sat.  WEIGHT BEARING RESTRICTIONS: No  PAIN:  Are you having pain? No  FALLS: Has patient fallen in last 6 months? No  LIVING ENVIRONMENT: Lives with: alone Lives in: House/apartment Stairs: Yes: Internal: 15 steps; on right going up Has following equipment at home: Single point cane and Walker - 2 wheeled  PLOF: Independent  PATIENT GOALS: Pt reports he wants to have better use of his left hand, be able to do buttons and shoes.   OBJECTIVE:   HAND DOMINANCE: Right  ADLs: Overall ADLs: modified independent with most tasks Transfers/ambulation related to ADLs: Now walking without an assistive device, used cane in the past.  Eating: Independent with self feeding, difficulty with cutting food, requires increased focus and effort. Grooming: Independent UB Dressing: Difficulty with buttons, uses pullovers most of the time  LB Dressing: Difficulty with tying shoes Toileting: Independent Bathing: Independent Tub Shower transfers: Independent Equipment: none  IADLs: Shopping: independent,  uses a cart to push in the store Light housekeeping: modified independent Meal Prep: difficulty with tasks requiring use of left hand, bilateral hand use.   Community mobility: Ambulates without use of assistive device, able to drive Medication management: Independent Financial management: Independent Handwriting: 90% legible  MOBILITY STATUS: Needs Assist: Pt has progressed with PT, used to use a cane but ambulating without an assistive device but still has balance difficulties.    POSTURE COMMENTS:    Sitting balance: Sits without UE support up to 30 sec  ACTIVITY TOLERANCE: Activity tolerance: limited activity  tolerance  FUNCTIONAL OUTCOME MEASURES: FOTO: 53  UPPER EXTREMITY ROM:    Active ROM Right eval Left eval  Shoulder flexion [facility]  Shoulder abduction [facility]  Shoulder adduction [facility]  Shoulder extension [facility]  Shoulder internal rotation [facility]  Shoulder external rotation [facility]  Elbow flexion [facility] WFL  Elbow extension [facility]  Wrist flexion [facility] WFL  Wrist extension [facility] WFL  Wrist ulnar deviation WFL WFL  Wrist radial deviation WFL WFL  Wrist pronation WFL WFL  Wrist supination WFL WFL  (Blank rows = not tested)  UPPER EXTREMITY MMT:     MMT Right eval Left eval  Shoulder flexion 5/5 3+/5  Shoulder abduction 5/5 3+/5  Shoulder adduction    Shoulder extension    Shoulder internal rotation    Shoulder external rotation    Middle trapezius    Lower trapezius    Elbow flexion 5/5 3+/5  Elbow extension 5/5 3+/5  Wrist flexion 5/5 3+/5  Wrist extension 5/5 3+/5  Wrist ulnar deviation 5/5 3+/5  Wrist radial deviation 5/5 3+/5  Wrist pronation 5/5 3+/5  Wrist supination 5/5 3+/5  (Blank rows = not tested)  Opposition of thumb to all digits on right, on left hand opposition to ring finger but cannot demonstrate to small finger.  HAND FUNCTION: Grip strength: Right: 44 lbs; Left: 35 lbs, Lateral pinch: Right: 17 lbs, Left: 16 lbs, 3 point pinch: Right: 14 lbs, Left: 0 lbs, and Tip pinch: Right 2 lbs, Left: 0 lbs  COORDINATION: 9 Hole Peg test: Right: 36 sec; Left: 56 sec  TODAY'S TREATMENT:                                                                                                                              Therapeutic Exercises: Facilitated L hand strengthening with use of hand gripper set at 11.2# to remove jumbo pegs from pegboard x3 trials using L hand.  Pt required rest between each set and min vc for hand positioning on gripper to reduce dropped pegs and maximize hand closure with each rep.  Facilitated L forearm, wrist, and hand strengthening with  participation in [place] board tools.  Pt worked with long handled tool to facilitate L wrist flex/ext and forearm pron/sup, small and large base key turn, small and large dial turn, and small dial to facilitate L hand digit flex/ext x3 reps for each tool (up/down board=1 rep).  Rest breaks between sets and min-mod vc for technique to maximize ROM with each tool rotation against wide strip of velcro resistance.  Pt attempted all tools with the wide strip of velcro, but downgraded to narrow strip of velcro for less resistance to rotate small key turn and small dial turn.     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  Pt required non-skid surface for easier pick up of the spacer pieces d/t frequent dropping.     PATIENT EDUCATION: Education details: fine motor coordination, manipulation skills, L hand strengthening Person educated: Patient Education method: Explanation Education comprehension: verbalized understanding  HOME EXERCISE PROGRAM: Will provide HEP in 1-2 visits  GOALS: Goals reviewed with patient? Yes  SHORT TERM GOALS: Target date:  11/18/2022   Pt will demonstrate ability to hold and utilize utensils for cutting meat with modified independence. Baseline: difficulty with cutting, stabilizing item with fork for cutting. Goal status: INITIAL  2.  Pt will demonstrate ability to obtain keys from left pocket without difficult and without dropping.   Baseline: pt struggles with reaching into left pocket to get keys.   Goal status: INITIAL   LONG TERM GOALS: Target date: 01/13/2023   Pt will demonstrate HEP with modified independence.   Baseline: no current program for left UE.  Goal status: INITIAL  2.  Pt will improve left UE coordination by 10 secs on 9 hole peg test to demonstrate shoe tying with modified independence.  Baseline: currently wearing slip on shoes and unable to tie shoes, left 9  hole peg test in 56 sec at eval  Goal status: INITIAL  3.  Pt will demonstrate improved coordination in left hand to demonstrate buttoning buttons on clothing with modified independence.  Baseline: difficulty with buttons and  has been wearing more pullovers to avoid challenge of buttons. Goal status: INITIAL  4.  Pt will demonstrate improvement in left grip strength by 5# to open jars and containers with modified independence and greater ease.   Baseline: Difficulty with opening jars and containers with use of left hand at eval.  Goal status: INITIAL  5.  Pt will improve left UE strength by 1 mm grade to hold and hand wash dishes with modified independence.  Baseline: difficulty with being able to hand wash dishes at eval, difficulty stabilizing dish. Goal status: INITIAL  6.  Pt will demonstrate FOTO score of 59 or greater to show a clinically relevant change in LUE to impact greater independence with daily ADL and IADL tasks at home and in the community.   Baseline:  Goal status: INITIAL  ASSESSMENT: CLINICAL IMPRESSION: Pt demonstrates decreased strength of hands bilaterally, left hand weaker than right.  Able to tolerate 11.2# with the hand gripper for repeated and sustained L grip to remove jumbo pegs from board x3 trials, rest breaks between sets.  Coordination remains impaired in the left hand and noted when manipulating [place] peg pieces.  Pt required non-skid surface for easier pick up of spacer pieces.  Continue to work towards goals in plan of care to improve strength and coordination of bilateral UEs with emphasis on left hand.    PERFORMANCE DEFICITS: in functional skills including ADLs, IADLs, coordination, dexterity, sensation, tone, ROM, strength, flexibility, Fine motor control, balance, endurance, decreased knowledge of use of DME, and UE functional use, cognitive skills including, and psychosocial skills including environmental adaptation, habits, and routines and behaviors.    IMPAIRMENTS: are limiting patient from ADLs, IADLs, and social participation.   CO-MORBIDITIES: has co-morbidities such as CAD, ESRD with dialysis, deafness in right ear, diabetic retinopathy, HTN, neuropathy  that affects occupational performance. Patient will benefit from skilled OT to address above impairments and improve overall function.  MODIFICATION OR ASSISTANCE TO COMPLETE EVALUATION: Min-Moderate modification of tasks or assist with assess necessary to complete an evaluation.  OT OCCUPATIONAL PROFILE AND HISTORY: Comprehensive assessment: Review of records and extensive additional review of physical, cognitive, psychosocial history related to current functional performance.  CLINICAL DECISION MAKING: Moderate - several treatment options, min-mod task modification necessary  REHAB POTENTIAL: Good  EVALUATION COMPLEXITY: Moderate    PLAN:  OT FREQUENCY: 2x/week  OT DURATION: 12 weeks  PLANNED INTERVENTIONS: self care/ADL training, therapeutic exercise, therapeutic activity, neuromuscular re-education, manual therapy, balance training, paraffin, moist heat, contrast bath, patient/family education, and DME and/or AE instructions  RECOMMENDED OTHER SERVICES: Pt is being seen by PT currently.   CONSULTED AND AGREED WITH PLAN OF CARE: Patient  PLAN FOR NEXT SESSION: Initiate HEP for grip, pinch and coordination skills.   Leta Speller, MS, OTR/L  Darleene Cleaver, OT 11/21/2022, 2:20 PM

## 2022-11-25 ENCOUNTER — Encounter: Payer: Medicare Other | Admitting: Occupational Therapy

## 2022-11-25 ENCOUNTER — Ambulatory Visit: Payer: Medicare Other | Admitting: Physical Therapy

## 2022-11-25 DIAGNOSIS — N250 Renal osteodystrophy: Secondary | ICD-10-CM | POA: Insufficient documentation

## 2022-11-27 ENCOUNTER — Encounter: Payer: Self-pay | Admitting: Occupational Therapy

## 2022-11-27 ENCOUNTER — Ambulatory Visit: Payer: Medicare Other | Attending: Internal Medicine

## 2022-11-27 ENCOUNTER — Ambulatory Visit: Payer: Medicare Other | Admitting: Occupational Therapy

## 2022-11-27 DIAGNOSIS — M6281 Muscle weakness (generalized): Secondary | ICD-10-CM

## 2022-11-27 DIAGNOSIS — R278 Other lack of coordination: Secondary | ICD-10-CM | POA: Insufficient documentation

## 2022-11-27 DIAGNOSIS — R262 Difficulty in walking, not elsewhere classified: Secondary | ICD-10-CM | POA: Insufficient documentation

## 2022-11-27 DIAGNOSIS — R269 Unspecified abnormalities of gait and mobility: Secondary | ICD-10-CM | POA: Insufficient documentation

## 2022-11-27 DIAGNOSIS — R2689 Other abnormalities of gait and mobility: Secondary | ICD-10-CM | POA: Diagnosis present

## 2022-11-27 DIAGNOSIS — R2681 Unsteadiness on feet: Secondary | ICD-10-CM

## 2022-11-27 NOTE — Therapy (Signed)
OUTPATIENT PHYSICAL THERAPY NEURO TREATMENT   Patient Name: Gregory Crane MRN: [id number] DOB:1970/10/27, 52 y.o., male Today's Date: 11/27/2022  PCP: [name] MD REFERRING PROVIDER: [name] MD  PT End of Session - 11/27/22 [phone]     Visit Number 39    Number of Visits 60    Date for PT Re-Evaluation 02/03/23    Authorization Type Medicare    Authorization Time Period 11/11/22-02/03/23    Progress Note Due on Visit 11    PT Start Time 0809   pt running behind   PT Stop Time 0840    PT Time Calculation (min) 31 min    Equipment Utilized During Treatment Gait belt    Activity Tolerance Patient tolerated treatment well;No increased pain;Patient limited by fatigue    Behavior During Therapy [facility] for tasks assessed/performed                  Past Medical History:  Diagnosis Date   Allergy    Anemia    Bell's palsy    Diabetes mellitus without complication ([place])    diet controlled   Hypertension    Hypothyroidism    Kidney stones    Pseudotumor cerebri    Stroke [name])    Past Surgical History:  Procedure Laterality Date   COLONOSCOPY WITH PROPOFOL N/A 03/30/2020   Procedure: COLONOSCOPY WITH PROPOFOL;  Surgeon: Lesly Rubenstein, MD;  Location: [facility] ENDOSCOPY;  Service: Endoscopy;  Laterality: N/A;   LOOP RECORDER INSERTION N/A 01/27/2018   Procedure: LOOP RECORDER INSERTION;  Surgeon: [name], MD;  Location: [place] [facility];  Service: Cardiovascular;  Laterality: N/A;   LUMBAR PUNCTURE     as child   NO PAST SURGERIES     TEE WITHOUT CARDIOVERSION N/A 01/07/2018   Procedure: TRANSESOPHAGEAL ECHOCARDIOGRAM (TEE);  Surgeon: Minna Merritts, MD;  Location: [facility] ORS;  Service: Cardiovascular;  Laterality: N/A;   Patient Active Problem List   Diagnosis Date Noted   Unsteady gait 10/05/2022   Hydronephrosis, left 05/04/2022   Type II diabetes mellitus with renal manifestations 05/04/2022   Leukocytosis 05/04/2022   Pre-op evaluation  04/24/2022   Headache 07/07/2021   Hearing loss 07/07/2021   Open wound 01/25/2021   History of colon polyps 10/15/2020   Postoperative hemorrhage involving digestive system following digestive system procedure 09/19/2020   Acute cholecystitis without calculus 09/09/2020   Type 2 diabetes mellitus, with long-term current use of insulin 09/09/2020   Cryptogenic stroke 07/13/2020   History of loop recorder 07/13/2020   Weakness 06/18/2020   History of 2019 novel coronavirus disease (COVID-19) 05/20/2020   ESRD (end stage renal disease) 04/04/2020   Pneumonia due to COVID-19 virus 04/03/2020   AKI (acute kidney injury) 04/03/2020   Elevated troponin 04/03/2020   Acquired trigger finger 06/08/2019   Lymphedema 06/08/2019   Anemia 04/17/2019   Swelling of left lower extremity 01/10/2019   Facial droop 04/09/2018   Daytime somnolence 03/30/2018   Carotid artery disease 02/03/2018   Intracranial vascular stenosis 09/12/2017   Cough 01/20/2017   Bell's palsy 11/10/2016   History of CVA (cerebrovascular accident) 11/10/2016   Benign localized hyperplasia of prostate with urinary obstruction 10/27/2016   History of nephrolithiasis 10/27/2016   TIA (transient ischemic attack) 10/20/2016   Near syncope 06/23/2016   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   Neuropathy 08/06/2015   Health care maintenance 08/06/2015   Essential hypertension 08/06/2015   Heme positive stool 10/10/2013   Hypothyroidism 10/10/2013   Microalbuminuria  10/10/2013   Hyperlipidemia 10/10/2013   B12 deficiency 10/10/2013   Diabetes 07/04/2013   Environmental allergies 07/04/2013   ONSET DATE: 2-3 years  REFERRING DIAG: Neuropathy  THERAPY DIAG:  Muscle weakness (generalized)  Other lack of coordination  Unsteadiness on feet  Difficulty in walking, not elsewhere classified  Other abnormalities of gait and mobility  Rationale for Evaluation and Treatment Rehabilitation  SUBJECTIVE:                                                                                                                                                                                              SUBJECTIVE STATEMENT: Pt was very worn out from kidney testing on Monday and then extra high volume dialysis yesterday. He wanted to cancel but decided he should come anyway and give it his best.    Pt accompanied by: self  PERTINENT HISTORY: Patient presents to physical therapy for neuropathy. He underwent banding of his L UE fistula on 05/13/22 and scheduled for additional banding on 06/07/22. 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 Patient reports his balance is very unsteady due to neuropathy in feet, feels weak in LE's.   PAIN:  No pain today, just soreness in legs  PRECAUTIONS: Fall  WEIGHT BEARING RESTRICTIONS Yes no lifting >5 lb in arm   FALLS: Has patient fallen in last 6 months? No  PLOF: Independent  PATIENT GOALS to be more steady and walk as normally as possible.   OBJECTIVE:   TODAY'S TREATMENT:  -AA/ROM on Nustep, seat 9, Right arms 9, left arm 11, x 6 minutes   -c 5lb AW donned bilat, standing in // bars: 1x10 marching, 1x10 hip ABDCT, 1x10 hip extension   *seated recovery   -c 5lb AW donned bilat, standing in // bars: forward walk, backward walk x3 each, then side stepping 5x each direction   *seated recovery   -balance beam walking 4x in // bars, hand support ad lib only -STS from chair + airex pad x8 hands free -balance beam walking 4x in // bars, hand support ad lib only    PATIENT EDUCATION: Education details: HEP, goals, POC. Pt educated throughout session about proper posture and technique with exercises. Improved exercise technique, movement at target joints, use of target muscles after min to mod verbal,  visual, tactile cues.  Person educated: Patient Education method: Explanation, Demonstration, Tactile cues, Verbal cues, and Handouts Education comprehension: verbalized understanding, returned demonstration, verbal cues required, and tactile cues required  HOME  EXERCISE PROGRAM: Access Code: [email or web address] URL: [email or web address] Date: 06/03/2022 Prepared by: Janna Arch  Exercises - Seated Heel Toe Raises  - 1 x daily - 7 x weekly - 2 sets - 10 reps - 5 hold - Standing Tandem Balance with Counter Support  - 1 x daily - 7 x weekly - 2 sets - 2 reps - 30 hold - Standing March with Counter Support  - 1 x daily - 7 x weekly - 2 sets - 10 reps - 5 hold   GOALS: Goals reviewed with patient? Yes   SHORT TERM GOALS: Target date: 07/01/2022  Patient will be independent in home exercise program to improve strength/mobility for better functional independence with ADLs. Baseline:10/9; HEP given 2/21: HEP compliant  Goal status: MET   LONG TERM GOALS: Target date: 02/03/2023  Patient will increase FOTO score to equal to or greater than   63%  to demonstrate statistically significant improvement in mobility and quality of life.  Baseline: 10/9: 53% 11/29: 58% 12/27: 51% 1/17: 56% 2/21: 53% 3/18: 60% Goal status: IN PROGRESS  2.  Patient (< 40 years old) will complete five times sit to stand test in < 10 seconds without UE support indicating an increased LE strength and improved balance. Baseline: 10/9: 19.6 seconds with walking stick; one LOB 11/29: 20.56 seconds 12/27: 13.4 seconds hands on knees 1/17: 13 seconds hands on knees  2/21: 14 seconds no hands 3/18: 12 seconds no hands Goal status: IN PROGRESS  3.  Patient will increase Berg Balance score by > 45/56 to demonstrate decreased fall risk during functional activities. Baseline: 10/9: 33/56 [id number] 2/21: 46/56 3/18: 48/56  Goal status: MET  4.  Patient will increase 10 meter walk test to  >1.75m/s as to improve gait speed for better community ambulation and to reduce fall risk. Baseline: 11/29: 1.19 m/s Goal status: MET  5.  Patient will increase ABC scale score >80% to demonstrate better functional mobility and better confidence with ADLs.  Baseline: 10/9: 67% 11/29: 75.6% 12/27: 78%  2/21: 78% 3/18: 80% Goal status: MET  6. Patient will tolerate 5 seconds of single leg stance without loss of balance to improve ability to get in and out of shower safely. Baseline: 11/29: unable to stand on single leg safely 12/27: 2 seconds 1/17: 3 seconds 2/21: 3 seconds 3/18: 4 seconds Goal status: IN PROGRESS   6. Patient will increase six minute walk test distance to >1000 for progression to community ambulator and improve gait ability Baseline: 12/27: 845 ft with with walking stick 1/17: 769ft 2/21: 930 ft  3/18: 950 ft Goal status: partially met  7. Patient will increase dynamic gait index score to >19/24 as to demonstrate reduced fall risk and improved dynamic gait balance for better safety with community/home ambulation.  Baseline: 3/18:  9/24 Goal status: NEW   ASSESSMENT:  CLINICAL IMPRESSION: Modified session today given pt still feeling fatigued and sore from previous days medical tests/procedures. Pt remains motivated, puts forth what effort he is able. Additional recovery intervals offered as needed to maintain desired level of intensity. Will continue to advance balance and strength interventions as appropriate.   OBJECTIVE IMPAIRMENTS Abnormal gait, decreased activity tolerance, decreased balance, decreased coordination, decreased endurance, decreased mobility, difficulty walking, decreased strength, impaired flexibility, impaired sensation, improper body mechanics, and pain.   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   REHAB POTENTIAL: Good  CLINICAL DECISION MAKING: Evolving/moderate complexity  EVALUATION COMPLEXITY: Moderate  PLAN: PT FREQUENCY: 2x/week  PT DURATION: 12 weeks  PLANNED INTERVENTIONS: Therapeutic exercises, Therapeutic activity, Neuromuscular re-education, Balance training, Gait training, Patient/Family education, Self Care, Joint mobilization, Stair training, Vestibular training, Canalith repositioning, Visual/preceptual remediation/compensation, DME instructions, Dry Needling, Cognitive remediation, Spinal mobilization, Cryotherapy, Moist heat, Manual lymph drainage, Compression bandaging, Taping, Vasopneumatic device, Ultrasound, Manual therapy, and Re-evaluation  PLAN FOR NEXT SESSION:  BLE strength, continue to progress dynamic balance and dual tasking balance   Etta Grandchild PT  8:14 AM, 11/27/22 Etta Grandchild, PT, DPT Physical Therapist - [facility]  Outpatient Physical Therapy- [place] [phone]     Physical Therapist- [facility]  11/27/22, 8:14 AM

## 2022-11-27 NOTE — Therapy (Signed)
OUTPATIENT OCCUPATIONAL THERAPY NEURO TREATMENT  Patient Name: Gregory Crane MRN: [id number] DOB:09-22-1970, 52 y.o., male  PCP: [name] REFERRING PROVIDER: [name]   END OF SESSION:  OT End of Session - 11/27/22 0850     Visit Number 6    Number of Visits 24    Date for OT Re-Evaluation 01/13/23    OT Start Time 0845    Activity Tolerance Patient tolerated treatment well    Behavior During Therapy [facility] for tasks assessed/performed             Past Medical History:  Diagnosis Date   Allergy    Anemia    Bell's palsy    Diabetes mellitus without complication    diet controlled   Hypertension    Hypothyroidism    Kidney stones    Pseudotumor cerebri    Stroke    Past Surgical History:  Procedure Laterality Date   COLONOSCOPY WITH PROPOFOL N/A 03/30/2020   Procedure: COLONOSCOPY WITH PROPOFOL;  Surgeon: Lesly Rubenstein, MD;  Location: [facility];  Service: Endoscopy;  Laterality: N/A;   LOOP RECORDER INSERTION N/A 01/27/2018   Procedure: LOOP RECORDER INSERTION;  Surgeon: [name], MD;  Location: [facility];  Service: Cardiovascular;  Laterality: N/A;   LUMBAR PUNCTURE     as child   NO PAST SURGERIES     TEE WITHOUT CARDIOVERSION N/A 01/07/2018   Procedure: TRANSESOPHAGEAL ECHOCARDIOGRAM (TEE);  Surgeon: Minna Merritts, MD;  Location: [facility] ORS;  Service: Cardiovascular;  Laterality: N/A;   Patient Active Problem List   Diagnosis Date Noted   Unsteady gait 10/05/2022   Hydronephrosis, left 05/04/2022   Type II diabetes mellitus with renal manifestations 05/04/2022   Leukocytosis 05/04/2022   Pre-op evaluation 04/24/2022   Headache 07/07/2021   Hearing loss 07/07/2021   Open wound 01/25/2021   History of colon polyps 10/15/2020   Postoperative hemorrhage involving digestive system following digestive system procedure 09/19/2020   Acute cholecystitis without calculus 09/09/2020   Type 2 diabetes mellitus, with long-term  current use of insulin 09/09/2020   Cryptogenic stroke 07/13/2020   History of loop recorder 07/13/2020   Weakness 06/18/2020   History of 2019 novel coronavirus disease (COVID-19) 05/20/2020   ESRD (end stage renal disease) 04/04/2020   Pneumonia due to COVID-19 virus 04/03/2020   AKI (acute kidney injury) 04/03/2020   Elevated troponin 04/03/2020   Acquired trigger finger 06/08/2019   Lymphedema 06/08/2019   Anemia 04/17/2019   Swelling of left lower extremity 01/10/2019   Facial droop 04/09/2018   Daytime somnolence 03/30/2018   Carotid artery disease 02/03/2018   Intracranial vascular stenosis 09/12/2017   Cough 01/20/2017   Bell's palsy 11/10/2016   History of CVA (cerebrovascular accident) 11/10/2016   Benign localized hyperplasia of prostate with urinary obstruction 10/27/2016   History of nephrolithiasis 10/27/2016   TIA (transient ischemic attack) 10/20/2016   Near syncope 06/23/2016   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   Neuropathy 08/06/2015   Health care maintenance 08/06/2015   Essential hypertension 08/06/2015   Heme positive stool 10/10/2013   Hypothyroidism 10/10/2013   Microalbuminuria 10/10/2013   Hyperlipidemia 10/10/2013   B12 deficiency 10/10/2013   Diabetes 07/04/2013   Environmental allergies 07/04/2013    ONSET DATE: May 2023  REFERRING DIAG: CVA  THERAPY DIAG:  Muscle weakness (generalized)  Other lack of coordination  Unsteadiness on feet  Rationale for Evaluation and Treatment: Rehabilitation  SUBJECTIVE:  SUBJECTIVE STATEMENT: Pt reports he is feeling fatigued  and weak from going to [facility] on Monday for extensive evaluation and testing for the kidney transport list.  He reports he had to walk a lot all day for 7 tests and drank a lot which caused a lot of fluid to have to be taken off via dialysis yesterday.  Pain 5/10 today low back, hips and legs.   Pt accompanied by: self  PERTINENT HISTORY: Patient with history of CVA, banding of  fistula in left arm with worsening of LUE functional use over time since May 2023, he has been seeing PT for balance deficits.  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    PRECAUTIONS: Fall and Other: No strengthening exercises greater than 5-10 pounds in left arm.   Dialysis Tues, Th, Sat.  WEIGHT BEARING RESTRICTIONS: No  PAIN:  Are you having pain? No  FALLS: Has patient fallen in last 6 months? No  LIVING ENVIRONMENT: Lives with: alone Lives in: House/apartment Stairs: Yes: Internal: 15 steps; on right going up Has following equipment at home: Single point cane and Walker - 2 wheeled  PLOF: Independent  PATIENT GOALS: Pt reports he wants to have better use of his left hand, be able to do buttons and shoes.   OBJECTIVE:   HAND DOMINANCE: Right  ADLs: Overall ADLs: modified independent with most tasks Transfers/ambulation related to ADLs: Now walking without an assistive device, used cane in the past.  Eating: Independent with self feeding, difficulty with cutting food, requires increased focus and effort. Grooming: Independent UB Dressing: Difficulty with buttons, uses pullovers most of the time  LB Dressing: Difficulty with tying shoes Toileting: Independent Bathing: Independent Tub Shower transfers: Independent Equipment: none  IADLs: Shopping: independent,  uses a cart to push in the store Light housekeeping: modified independent Meal Prep: difficulty with tasks requiring use of left hand, bilateral hand use.   Community mobility: Ambulates without use of assistive device, able to drive Medication management: Independent Financial management: Independent Handwriting: 90% legible  MOBILITY STATUS: Needs Assist: Pt has progressed with PT, used to use a cane but ambulating without  an assistive device but still has balance difficulties.    POSTURE COMMENTS:    Sitting balance: Sits without UE support up to 30 sec  ACTIVITY TOLERANCE: Activity tolerance: limited activity tolerance  FUNCTIONAL OUTCOME MEASURES: FOTO: 53  UPPER EXTREMITY ROM:    Active ROM Right eval Left eval  Shoulder flexion [facility]  Shoulder abduction [facility]  Shoulder adduction [facility]  Shoulder extension [facility]  Shoulder internal rotation [facility]  Shoulder external rotation [facility]  Elbow flexion [facility] WFL  Elbow extension [facility]  Wrist flexion [facility] WFL  Wrist extension [facility] WFL  Wrist ulnar deviation [facility] WFL  Wrist radial deviation [facility] WFL  Wrist pronation [facility] WFL  Wrist supination WFL WFL  (Blank rows = not tested)  UPPER EXTREMITY MMT:     MMT Right eval Left eval  Shoulder flexion 5/5 3+/5  Shoulder abduction 5/5 3+/5  Shoulder adduction    Shoulder extension    Shoulder internal rotation    Shoulder external rotation    Middle trapezius    Lower trapezius    Elbow flexion 5/5 3+/5  Elbow extension 5/5 3+/5  Wrist flexion 5/5 3+/5  Wrist extension 5/5 3+/5  Wrist ulnar deviation 5/5 3+/5  Wrist radial deviation  5/5 3+/5  Wrist pronation 5/5 3+/5  Wrist supination 5/5 3+/5  (Blank rows = not tested)  Opposition of thumb to all digits on right, on left hand opposition to ring finger but cannot demonstrate to small finger.   HAND FUNCTION: Grip strength: Right: 44 lbs; Left: 35 lbs, Lateral pinch: Right: 17 lbs, Left: 16 lbs, 3 point pinch: Right: 14 lbs, Left: 0 lbs, and Tip pinch: Right 2 lbs, Left: 0 lbs  COORDINATION: 9 Hole Peg test: Right: 36 sec; Left: 56 sec  TODAY'S TREATMENT:                                                                                                                              Therapeutic Exercises: Pt seen this date for hand strengthening this date with use of red theraputty and use of puttycise tools for a variety of gripping  patterns on tools.  Rest breaks as needed during task.  Pt's left hand fatigues quickly with use of tools, requires cues for adjustment of grip.  Use of L bar, large knob, peg, key and cap turn tools during session.   Neuromuscular Reeducation:  Manipulation and picking up small to large buttons from tabletop, increased difficulty with smaller buttons.  Placed buttons on washcloth and pt able to improve ability to pick up one at a time and move towards palm utilizing translatory movements of the hand.  Once he used the left hand for storage and had many items, he worked to move items to fingertips to place into container.  Difficulty at times moving items from palm especially to tip of index finger.      PATIENT EDUCATION: Education details: fine motor coordination, manipulation skills, L hand strengthening Person educated: Patient Education method: Explanation Education comprehension: verbalized understanding  HOME EXERCISE PROGRAM: Will provide HEP in 1-2 visits  GOALS: Goals reviewed with patient? Yes  SHORT TERM GOALS: Target date:  11/18/2022   Pt will demonstrate ability to hold and utilize utensils for cutting meat with modified independence. Baseline: difficulty with cutting, stabilizing item with fork for cutting. Goal status: INITIAL  2.  Pt will demonstrate ability to obtain keys from left pocket without difficult and without dropping.   Baseline: pt struggles with reaching into left pocket to get keys.   Goal status: INITIAL   LONG TERM GOALS: Target date: 01/13/2023   Pt will demonstrate HEP with modified independence.   Baseline: no current program for left UE.  Goal status: INITIAL  2.  Pt will improve left UE coordination by 10 secs on 9 hole peg test to demonstrate shoe tying with modified independence.  Baseline: currently wearing slip on shoes and unable to tie shoes, left 9 hole peg test in 56 sec at eval  Goal status: INITIAL  3.  Pt will demonstrate  improved coordination in left hand to demonstrate buttoning buttons on clothing with modified independence.  Baseline: difficulty with buttons and has been wearing  more pullovers to avoid challenge of buttons. Goal status: INITIAL  4.  Pt will demonstrate improvement in left grip strength by 5# to open jars and containers with modified independence and greater ease.   Baseline: Difficulty with opening jars and containers with use of left hand at eval.  Goal status: INITIAL  5.  Pt will improve left UE strength by 1 mm grade to hold and hand wash dishes with modified independence.  Baseline: difficulty with being able to hand wash dishes at eval, difficulty stabilizing dish. Goal status: INITIAL  6.  Pt will demonstrate FOTO score of 59 or greater to show a clinically relevant change in LUE to impact greater independence with daily ADL and IADL tasks at home and in the community.   Baseline:  Goal status: INITIAL  ASSESSMENT: CLINICAL IMPRESSION: Pt fatigued this date after multiple extensive testing appts on Monday at [facility] to determine his status for a potential kidney transplant.  He reported a lot of walking and having to drink a lot of fluids for the tests which made dialysis more difficult yesterday with the process pulling off a lot of fluid and leaving him weak.  Pt engaged in hand strengthening today with left UE, with resistive putty and use of a variety of hand tools requiring a variety of gripping patterns.  Pt performed well but required occasional rest breaks, stretch of left hand and adjustments in grip.  He responds well to verbal cues and therapist demonstration. Difficulty this date with picking up of small buttons from flat surface tabletop, improved with task modification of placing washcloth underneath to facilitate ability to pick up from surface. Continue to work towards goals in plan of care to maximize safety and independence in necessary daily tasks.      PERFORMANCE DEFICITS: in functional skills including ADLs, IADLs, coordination, dexterity, sensation, tone, ROM, strength, flexibility, Fine motor control, balance, endurance, decreased knowledge of use of DME, and UE functional use, cognitive skills including, and psychosocial skills including environmental adaptation, habits, and routines and behaviors.   IMPAIRMENTS: are limiting patient from ADLs, IADLs, and social participation.   CO-MORBIDITIES: has co-morbidities such as CAD, ESRD with dialysis, deafness in right ear, diabetic retinopathy, HTN, neuropathy  that affects occupational performance. Patient will benefit from skilled OT to address above impairments and improve overall function.  MODIFICATION OR ASSISTANCE TO COMPLETE EVALUATION: Min-Moderate modification of tasks or assist with assess necessary to complete an evaluation.  OT OCCUPATIONAL PROFILE AND HISTORY: Comprehensive assessment: Review of records and extensive additional review of physical, cognitive, psychosocial history related to current functional performance.  CLINICAL DECISION MAKING: Moderate - several treatment options, min-mod task modification necessary  REHAB POTENTIAL: Good  EVALUATION COMPLEXITY: Moderate    PLAN:  OT FREQUENCY: 2x/week  OT DURATION: 12 weeks  PLANNED INTERVENTIONS: self care/ADL training, therapeutic exercise, therapeutic activity, neuromuscular re-education, manual therapy, balance training, paraffin, moist heat, contrast bath, patient/family education, and DME and/or AE instructions  RECOMMENDED OTHER SERVICES: Pt is being seen by PT currently.   CONSULTED AND AGREED WITH PLAN OF CARE: Patient  PLAN FOR NEXT SESSION: Initiate HEP for grip, pinch and coordination skills.   Karsin Pesta T Tomasita Morrow, OTR/L, CLT  Kahlea Cobert, OT 11/27/2022, 1:45 PM

## 2022-12-02 ENCOUNTER — Ambulatory Visit: Payer: Medicare Other | Admitting: Occupational Therapy

## 2022-12-02 ENCOUNTER — Ambulatory Visit: Payer: Medicare Other | Admitting: Physical Therapy

## 2022-12-02 ENCOUNTER — Encounter: Payer: Self-pay | Admitting: Occupational Therapy

## 2022-12-02 DIAGNOSIS — R278 Other lack of coordination: Secondary | ICD-10-CM

## 2022-12-02 DIAGNOSIS — M6281 Muscle weakness (generalized): Secondary | ICD-10-CM

## 2022-12-02 DIAGNOSIS — R2681 Unsteadiness on feet: Secondary | ICD-10-CM

## 2022-12-02 DIAGNOSIS — R269 Unspecified abnormalities of gait and mobility: Secondary | ICD-10-CM

## 2022-12-02 DIAGNOSIS — R262 Difficulty in walking, not elsewhere classified: Secondary | ICD-10-CM

## 2022-12-02 NOTE — Therapy (Signed)
OUTPATIENT OCCUPATIONAL THERAPY NEURO TREATMENT  Patient Name: Gregory Crane MRN: [id number] DOB:[birth year], 52 y.o., male  PCP: Dale Fulton REFERRING PROVIDER: Dale Alton   END OF SESSION:  OT End of Session - 12/02/22 0852     Visit Number 7    Number of Visits 24    Date for OT Re-Evaluation 01/13/23    OT Start Time 0846    OT Stop Time 0930    OT Time Calculation (min) 44 min    Activity Tolerance Patient tolerated treatment well    Behavior During Therapy [facility] for tasks assessed/performed              Past Medical History:  Diagnosis Date   Allergy    Anemia    Bell's palsy    Diabetes mellitus without complication    diet controlled   Hypertension    Hypothyroidism    Kidney stones    Pseudotumor cerebri    Stroke    Past Surgical History:  Procedure Laterality Date   COLONOSCOPY WITH PROPOFOL N/A 03/30/2020   Procedure: COLONOSCOPY WITH PROPOFOL;  Surgeon: Regis Bill, MD;  Location: [facility] ENDOSCOPY;  Service: Endoscopy;  Laterality: N/A;   LOOP RECORDER INSERTION N/A 01/27/2018   Procedure: LOOP RECORDER INSERTION;  Surgeon: Duke Salvia, MD;  Location: [facility] INVASIVE CV LAB;  Service: Cardiovascular;  Laterality: N/A;   LUMBAR PUNCTURE     as child   NO PAST SURGERIES     TEE WITHOUT CARDIOVERSION N/A 01/07/2018   Procedure: TRANSESOPHAGEAL ECHOCARDIOGRAM (TEE);  Surgeon: Antonieta Iba, MD;  Location: [facility] ORS;  Service: Cardiovascular;  Laterality: N/A;   Patient Active Problem List   Diagnosis Date Noted   Unsteady gait 10/05/2022   Hydronephrosis, left 05/04/2022   Type II diabetes mellitus with renal manifestations 05/04/2022   Leukocytosis 05/04/2022   Pre-op evaluation 04/24/2022   Headache 07/07/2021   Hearing loss 07/07/2021   Open wound 01/25/2021   History of colon polyps 10/15/2020   Postoperative hemorrhage involving digestive system following digestive system procedure 09/19/2020   Acute cholecystitis without  calculus 09/09/2020   Type 2 diabetes mellitus, with long-term current use of insulin 09/09/2020   Cryptogenic stroke 07/13/2020   History of loop recorder 07/13/2020   Weakness 06/18/2020   History of 2019 novel coronavirus disease (COVID-19) 05/20/2020   ESRD (end stage renal disease) 04/04/2020   Pneumonia due to COVID-19 virus 04/03/2020   AKI (acute kidney injury) 04/03/2020   Elevated troponin 04/03/2020   Acquired trigger finger 06/08/2019   Lymphedema 06/08/2019   Anemia 04/17/2019   Swelling of left lower extremity 01/10/2019   Facial droop 04/09/2018   Daytime somnolence 03/30/2018   Carotid artery disease 02/03/2018   Intracranial vascular stenosis 09/12/2017   Cough 01/20/2017   Bell's palsy 11/10/2016   History of CVA (cerebrovascular accident) 11/10/2016   Benign localized hyperplasia of prostate with urinary obstruction 10/27/2016   History of nephrolithiasis 10/27/2016   TIA (transient ischemic attack) 10/20/2016   Near syncope 06/23/2016   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   Neuropathy 08/06/2015   Health care maintenance 08/06/2015   Essential hypertension 08/06/2015   Heme positive stool 10/10/2013   Hypothyroidism 10/10/2013   Microalbuminuria 10/10/2013   Hyperlipidemia 10/10/2013   B12 deficiency 10/10/2013   Diabetes 07/04/2013   Environmental allergies 07/04/2013    ONSET DATE: May 2023  REFERRING DIAG: CVA  THERAPY DIAG:  Muscle weakness (generalized)  Other lack of coordination  Unsteadiness on feet  Rationale for Evaluation and Treatment: Rehabilitation  SUBJECTIVE:  SUBJECTIVE STATEMENT: No pain noted this date, didn't do much over the weekend.  He was going to mow the grass and his mower is not working right now.    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    PRECAUTIONS: Fall and Other: No strengthening exercises greater than 5-10 pounds in left arm.   Dialysis Tues, Th, Sat.  WEIGHT BEARING RESTRICTIONS: No  PAIN:  Are you having pain? No  FALLS: Has patient fallen in last 6 months? No  LIVING ENVIRONMENT: Lives with: alone Lives in: House/apartment Stairs: Yes: Internal: 15 steps; on right going up Has following equipment at home: Single point cane and Walker - 2 wheeled  PLOF: Independent  PATIENT GOALS: Pt reports he wants to have better use of his left hand, be able to do buttons and shoes.   OBJECTIVE:   HAND DOMINANCE: Right  ADLs: Overall ADLs: modified independent with most tasks Transfers/ambulation related to ADLs: Now walking without an assistive device, used cane in the past.  Eating: Independent with self feeding, difficulty with cutting food, requires increased focus and effort. Grooming: Independent UB Dressing: Difficulty with buttons, uses pullovers most of the time  LB Dressing: Difficulty with tying shoes Toileting: Independent Bathing: Independent Tub Shower transfers: Independent Equipment: none  IADLs: Shopping: independent,  uses a cart to push in the store Light housekeeping: modified independent Meal Prep: difficulty with tasks requiring use of left hand, bilateral hand use.   Community mobility: Ambulates without use of assistive device, able to drive Medication management: Independent Financial management: Independent Handwriting: 90% legible  MOBILITY STATUS: Needs Assist: Pt has progressed with PT, used to use a cane but ambulating without an assistive device but still has balance difficulties.    POSTURE COMMENTS:    Sitting balance: Sits without UE support up to 30  sec  ACTIVITY TOLERANCE: Activity tolerance: limited activity tolerance  FUNCTIONAL OUTCOME MEASURES: FOTO: 53  UPPER EXTREMITY ROM:    Active ROM Right eval Left eval  Shoulder flexion [facility]  Shoulder abduction [facility]  Shoulder adduction [facility]  Shoulder extension [facility]  Shoulder internal rotation [facility]  Shoulder external rotation [facility]  Elbow flexion [facility] WFL  Elbow extension [facility]  Wrist flexion [facility] WFL  Wrist extension [name] WFL  Wrist ulnar deviation [facility] WFL  Wrist radial deviation WFL WFL  Wrist pronation WFL WFL  Wrist supination WFL WFL  (Blank rows = not tested)  UPPER EXTREMITY MMT:     MMT Right eval Left eval  Shoulder flexion 5/5 3+/5  Shoulder abduction 5/5 3+/5  Shoulder adduction    Shoulder extension    Shoulder internal rotation    Shoulder external rotation    Middle trapezius    Lower trapezius    Elbow flexion 5/5 3+/5  Elbow extension 5/5 3+/5  Wrist flexion 5/5 3+/5  Wrist extension 5/5 3+/5  Wrist ulnar deviation 5/5 3+/5  Wrist radial deviation 5/5 3+/5  Wrist pronation 5/5 3+/5  Wrist supination 5/5 3+/5  (Blank rows = not tested)  Opposition of thumb  to all digits on right, on left hand opposition to ring finger but cannot demonstrate to small finger.   HAND FUNCTION: Grip strength: Right: 44 lbs; Left: 35 lbs, Lateral pinch: Right: 17 lbs, Left: 16 lbs, 3 point pinch: Right: 14 lbs, Left: 0 lbs, and Tip pinch: Right 2 lbs, Left: 0 lbs  COORDINATION: 9 Hole Peg test: Right: 36 sec; Left: 56 sec  TODAY'S TREATMENT:                                                                                                                              Therapeutic Exercises: Pt seen this date for left UE grip strengthening with use of resistive hand gripper with 4th setting for 2 sets of 20 reps. One set of 5th setting (27#) after rest break. Occasional cues for hand position on gripper for shorter pieces to pick up.      Neuromuscular Reeducation:  Manipulation of [place] discs with left hand, turning and flipping.  Advanced to bilateral hand use to turn and flip with both hands simultaneously with increased focus and effort. Stacking of discs with left hand up to 20 at a time for 2 sets with cues for precision use of left hand.  Knocked one stack over with attempts to stack over 20.      PATIENT EDUCATION: Education details: fine motor coordination, manipulation skills, L hand strengthening Person educated: Patient Education method: Explanation Education comprehension: verbalized understanding  HOME EXERCISE PROGRAM: Will provide HEP in 1-2 visits  GOALS: Goals reviewed with patient? Yes  SHORT TERM GOALS: Target date:  11/18/2022   Pt will demonstrate ability to hold and utilize utensils for cutting meat with modified independence. Baseline: difficulty with cutting, stabilizing item with fork for cutting. Goal status: INITIAL  2.  Pt will demonstrate ability to obtain keys from left pocket without difficult and without dropping.   Baseline: pt struggles with reaching into left pocket to get keys.   Goal status: INITIAL   LONG TERM GOALS: Target date: 01/13/2023   Pt will demonstrate HEP with modified independence.   Baseline: no current program for left UE.  Goal status: INITIAL  2.  Pt will improve left UE coordination by 10 secs on 9 hole peg test to demonstrate shoe tying with modified independence.  Baseline: currently wearing slip on shoes and unable to tie shoes, left 9 hole peg test in 56 sec at eval  Goal status: INITIAL  3.  Pt will demonstrate improved coordination in left hand to demonstrate buttoning buttons on clothing with modified independence.  Baseline: difficulty with buttons and has been wearing more pullovers to avoid challenge of buttons. Goal status: INITIAL  4.  Pt will demonstrate improvement in left grip strength by 5# to open jars and containers with  modified independence and greater ease.   Baseline: Difficulty with opening jars and containers with use of left hand at eval.  Goal status: INITIAL  5.  Pt will improve left UE strength by 1 mm grade to hold and hand wash dishes with modified independence.  Baseline: difficulty with being able to hand wash dishes at eval, difficulty stabilizing dish. Goal status: INITIAL  6.  Pt will demonstrate FOTO score of 59 or greater to show a clinically relevant change in LUE to impact greater independence with daily ADL and IADL tasks at home and in the community.   Baseline:  Goal status: INITIAL  ASSESSMENT: CLINICAL IMPRESSION: Pt progressing well with left hand function, has increased grip strength with use of hand gripper for 2 sets of 20 reps with 4th setting (23#) and then advanced to another set with 5th setting (27#) after an activity break.  Pt continues to demonstrate impairments in left coordination skills especially tasks which require precision of movement patterns.  He responds well to verbal cues with tasks.  Continues to demonstrate hand fatigue with tasks and benefits from short rest breaks and alternating of activities during session.  Continue to work towards goals in plan of care to improve left UE strength, ROM, and coordination/manipulation skills.    PERFORMANCE DEFICITS: in functional skills including ADLs, IADLs, coordination, dexterity, sensation, tone, ROM, strength, flexibility, Fine motor control, balance, endurance, decreased knowledge of use of DME, and UE functional use, cognitive skills including, and psychosocial skills including environmental adaptation, habits, and routines and behaviors.   IMPAIRMENTS: are limiting patient from ADLs, IADLs, and social participation.   CO-MORBIDITIES: has co-morbidities such as CAD, ESRD with dialysis, deafness in right ear, diabetic retinopathy, HTN, neuropathy  that affects occupational performance. Patient will benefit from skilled  OT to address above impairments and improve overall function.  MODIFICATION OR ASSISTANCE TO COMPLETE EVALUATION: Min-Moderate modification of tasks or assist with assess necessary to complete an evaluation.  OT OCCUPATIONAL PROFILE AND HISTORY: Comprehensive assessment: Review of records and extensive additional review of physical, cognitive, psychosocial history related to current functional performance.  CLINICAL DECISION MAKING: Moderate - several treatment options, min-mod task modification necessary  REHAB POTENTIAL: Good  EVALUATION COMPLEXITY: Moderate    PLAN:  OT FREQUENCY: 2x/week  OT DURATION: 12 weeks  PLANNED INTERVENTIONS: self care/ADL training, therapeutic exercise, therapeutic activity, neuromuscular re-education, manual therapy, balance training, paraffin, moist heat, contrast bath, patient/family education, and DME and/or AE instructions  RECOMMENDED OTHER SERVICES: Pt is being seen by PT currently.   CONSULTED AND AGREED WITH PLAN OF CARE: Patient  PLAN FOR NEXT SESSION: Initiate HEP for grip, pinch and coordination skills.   Yosselyn Tax Cornelius Moras, OTR/L, CLT  Angeliah Wisdom, OT 12/03/2022, 1:43 PM

## 2022-12-02 NOTE — Therapy (Signed)
OUTPATIENT PHYSICAL THERAPY NEURO TREATMENT PHYSICAL THERAPY PROGRESS NOTE   Dates of reporting period  11/11/22   to   12/02/2022     Patient Name: Gregory Crane MRN: [id number] DOB:August 05, 1971, 52 y.o., male Today's Date: 12/02/2022  PCP: Gregory Marion MD REFERRING PROVIDER: Dale Mukwonago MD  PT End of Session - 12/02/22 0814     Visit Number 40    Number of Visits 60    Date for PT Re-Evaluation 02/03/23    Authorization Type Medicare    Authorization Time Period 11/11/22-02/03/23    Progress Note Due on Visit 50    PT Start Time 0811    PT Stop Time 0846    PT Time Calculation (min) 35 min    Equipment Utilized During Treatment Gait belt    Activity Tolerance Patient tolerated treatment well;No increased pain;Patient limited by fatigue    Behavior During Therapy [facility] for tasks assessed/performed                  Past Medical History:  Diagnosis Date   Allergy    Anemia    Bell's palsy    Diabetes mellitus without complication    diet controlled   Hypertension    Hypothyroidism    Kidney stones    Pseudotumor cerebri    Stroke    Past Surgical History:  Procedure Laterality Date   COLONOSCOPY WITH PROPOFOL N/A 03/30/2020   Procedure: COLONOSCOPY WITH PROPOFOL;  Surgeon: Gregory Bill, MD;  Location: [facility] ENDOSCOPY;  Service: Endoscopy;  Laterality: N/A;   LOOP RECORDER INSERTION N/A 01/27/2018   Procedure: LOOP RECORDER INSERTION;  Surgeon: Gregory Salvia, MD;  Location: [facility] INVASIVE CV LAB;  Service: Cardiovascular;  Laterality: N/A;   LUMBAR PUNCTURE     as child   NO PAST SURGERIES     TEE WITHOUT CARDIOVERSION N/A 01/07/2018   Procedure: TRANSESOPHAGEAL ECHOCARDIOGRAM (TEE);  Surgeon: Gregory Iba, MD;  Location: [facility] ORS;  Service: Cardiovascular;  Laterality: N/A;   Patient Active Problem List   Diagnosis Date Noted   Unsteady gait 10/05/2022   Hydronephrosis, left 05/04/2022   Type II diabetes mellitus with renal manifestations  05/04/2022   Leukocytosis 05/04/2022   Pre-op evaluation 04/24/2022   Headache 07/07/2021   Hearing loss 07/07/2021   Open wound 01/25/2021   History of colon polyps 10/15/2020   Postoperative hemorrhage involving digestive system following digestive system procedure 09/19/2020   Acute cholecystitis without calculus 09/09/2020   Type 2 diabetes mellitus, with long-term current use of insulin 09/09/2020   Cryptogenic stroke 07/13/2020   History of loop recorder 07/13/2020   Weakness 06/18/2020   History of 2019 novel coronavirus disease (COVID-19) 05/20/2020   ESRD (end stage renal disease) 04/04/2020   Pneumonia due to COVID-19 virus 04/03/2020   AKI (acute kidney injury) 04/03/2020   Elevated troponin 04/03/2020   Acquired trigger finger 06/08/2019   Lymphedema 06/08/2019   Anemia 04/17/2019   Swelling of left lower extremity 01/10/2019   Facial droop 04/09/2018   Daytime somnolence 03/30/2018   Carotid artery disease 02/03/2018   Intracranial vascular stenosis 09/12/2017   Cough 01/20/2017   Bell's palsy 11/10/2016   History of CVA (cerebrovascular accident) 11/10/2016   Benign localized hyperplasia of prostate with urinary obstruction 10/27/2016   History of nephrolithiasis 10/27/2016   TIA (transient ischemic attack) 10/20/2016   Near syncope 06/23/2016   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   Neuropathy 08/06/2015   Health care maintenance 08/06/2015   Essential hypertension  08/06/2015   Heme positive stool 10/10/2013   Hypothyroidism 10/10/2013   Microalbuminuria 10/10/2013   Hyperlipidemia 10/10/2013   B12 deficiency 10/10/2013   Diabetes 07/04/2013   Environmental allergies 07/04/2013   ONSET DATE: 2-3 years  REFERRING DIAG: Neuropathy  THERAPY DIAG:  Muscle weakness (generalized)  Other lack of coordination  Unsteadiness on feet  Difficulty in walking, not elsewhere classified  Abnormality of gait and mobility  Rationale for Evaluation and Treatment  Rehabilitation  SUBJECTIVE:                                                                                                                                                                                             SUBJECTIVE STATEMENT: Pt reports weekend was uneventful. No pain reported. Reports that he has recovered from testing at [facility] last week.     Pt accompanied by: self  PERTINENT HISTORY: Patient presents to physical therapy for neuropathy. He underwent banding of his L UE fistula on 05/13/22 and scheduled for additional banding on 06/07/22. 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 Patient reports his balance is very unsteady due to neuropathy in feet, feels weak in LE's.   PAIN:  No pain today, just soreness in legs  PRECAUTIONS: Fall  WEIGHT BEARING RESTRICTIONS Yes no lifting >5 lb in arm   FALLS: Has patient fallen in last 6 months? No  PLOF: Independent  PATIENT GOALS to be more steady and walk as normally as possible.   OBJECTIVE:   TODAY'S TREATMENT:  -AA/ROM on Nustep, seat 9, Right arms 9, left arm 9, x  Cues for neutral hip ER/IR on the RLE  PT instruction pt in goal assessment for Progress note.   6 Min Walk Test:  Instructed patient to ambulate as quickly and as safely as possible for 6 minutes using LRAD. Patient was allowed to take standing rest breaks without stopping the test, but if the patient required a sitting rest break the clock would be stopped and the test would be over.  Results: 1180 feet using a no AD with supervision assist. Results indicate that the patient has reduced endurance with ambulation compared to age matched norms.  Age Matched Norms: 108-69 yo M: 59 F: 45, 81-79 yo M: 47 F: 471, 13-89 yo M: 417 F: 392 MDC: 58.21 meters (190.98 feet) or  50 meters (ANPTA Core Set of Outcome Measures for Adults with Neurologic Conditions, 2018)  Pt performed 5 time sit<>stand (5xSTS): 11.3 sec UE on knees  sec (>15 sec indicates increased fall risk)   PT instructed pt in DGI. See below for results. Demonstrates increased fall risk with score of 16/24. (<19 indicates increased fall risk)     PATIENT EDUCATION: Education details: HEP, goals, POC. Pt educated throughout session about proper posture and technique with exercises. Improved exercise technique, movement at target joints, use of target muscles after min to mod verbal, visual, tactile cues.  Person educated: Patient Education method: Explanation, Demonstration, Tactile cues, Verbal cues, and Handouts Education comprehension: verbalized understanding, returned demonstration, verbal cues required, and tactile cues required  HOME EXERCISE PROGRAM: Access Code: [email or web address] URL: [email or web address] Date: 06/03/2022 Prepared by: Precious Bard  Exercises - Seated Heel Toe Raises  - 1 x daily - 7 x weekly - 2 sets - 10 reps - 5 hold - Standing Tandem Balance with Counter Support  - 1 x daily - 7 x weekly - 2 sets - 2 reps - 30 hold - Standing March with Counter Support  - 1 x daily - 7 x weekly - 2 sets - 10 reps - 5 hold   GOALS: Goals reviewed with patient? Yes   SHORT TERM GOALS: Target date: 07/01/2022  Patient will be independent in home exercise program to improve strength/mobility for better functional independence with ADLs. Baseline:10/9; HEP given 2/21: HEP compliant  Goal status: MET   LONG TERM GOALS: Target date: 02/03/2023  Patient will increase FOTO score to equal to or greater than   63%  to demonstrate statistically significant improvement in mobility and quality of life.  Baseline: 10/9: 53% 11/29: 58% 12/27: 51% 1/17: 56% 2/21: 53% 3/18: 60%  4/8 58% Goal status: IN PROGRESS  2.  Patient (< 49 years old) will complete five times sit to stand test  in < 10 seconds without UE support indicating an increased LE strength and improved balance. Baseline: 10/9: 19.6 seconds with walking stick; one LOB 11/29: 20.56 seconds 12/27: 13.4 seconds hands on knees 1/17: 13 seconds hands on knees  2/21: 14 seconds no hands 3/18: 12 seconds no hands. 4/8 11.3 sec UE on knees.  Goal status: IN PROGRESS  3.  Patient will increase Berg Balance score by > 45/56 to demonstrate decreased fall risk during functional activities. Baseline: 10/9: 33/56 11/29: 09/40 76/80: 88/11 0/31:  59/45 2/21: 46/56 3/18: 48/56  Goal status: MET  +4.  Patient will increase 10 meter walk test to >1.46m/s as to improve gait speed for better community ambulation and to reduce fall risk. Baseline: 11/29: 1.19 m/s Goal status: MET  5.  Patient will increase ABC scale score >80% to demonstrate better functional mobility and better confidence with ADLs.  Baseline: 10/9: 67% 11/29: 75.6% 12/27: 78%  2/21: 78% 3/18: 80% Goal status: MET  6. Patient will tolerate 5 seconds of single leg stance without loss of balance to improve ability to get in and out of shower safely. Baseline: 11/29: unable to stand on single leg safely 12/27: 2 seconds 1/17: 3 seconds 2/21: 3 seconds 3/18: 4 seconds. 4/8: 3 sec   Goal status: IN PROGRESS   6. Patient will increase six minute walk test distance to >1000 for progression to community ambulator and improve gait ability Baseline: 12/27: 845 ft with with walking stick 1/17: 73ft 2/21: 930 ft  3/18: 950 ft 4/8: 1155ft Goal status: met  7. Patient will increase dynamic gait index score to >19/24 as to demonstrate reduced fall risk and improved dynamic gait balance for better safety with community/home  ambulation.  Baseline: 3/18:  9/24. 4/8: 16/24 Goal status: in Progress   ASSESSMENT:  CLINICAL IMPRESSION: Pt tolerated treatment well. PT assessed pts progress towards LTG with noted improvement in 5xSTS to 11.3 sec, 6Min Walk Test 1,16760ft, and DGI  to 16/24. Patient's condition has the potential to improve in response to therapy. Maximum improvement is yet to be obtained. The anticipated improvement is attainable and reasonable in a generally predictable time. Pt noted to sustain same level of perceived impairment with slight regression from 60 to 58 on FOTO Will continue to advance balance and strength interventions as appropriate.   OBJECTIVE IMPAIRMENTS Abnormal gait, decreased activity tolerance, decreased balance, decreased coordination, decreased endurance, decreased mobility, difficulty walking, decreased strength, impaired flexibility, impaired sensation, improper body mechanics, and pain.   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   REHAB POTENTIAL: Good  CLINICAL DECISION MAKING: Evolving/moderate complexity  EVALUATION COMPLEXITY: Moderate  PLAN: PT FREQUENCY: 2x/week  PT DURATION: 12 weeks  PLANNED INTERVENTIONS: Therapeutic exercises, Therapeutic activity, Neuromuscular re-education, Balance training, Gait training, Patient/Family education, Self Care, Joint mobilization, Stair training, Vestibular training, Canalith repositioning, Visual/preceptual remediation/compensation, DME instructions, Dry Needling,  Cognitive remediation, Spinal mobilization, Cryotherapy, Moist heat, Manual lymph drainage, Compression bandaging, Taping, Vasopneumatic device, Ultrasound, Manual therapy, and Re-evaluation  PLAN FOR NEXT SESSION:  BLE strength, continue to progress dynamic balance and dual tasking balance   Grier RocherAustin Amaranta Mehl PT, DPT  Physical Therapist - [facility]  [facility]  10:03 AM 12/02/22

## 2022-12-04 ENCOUNTER — Ambulatory Visit: Payer: Medicare Other | Admitting: Occupational Therapy

## 2022-12-04 ENCOUNTER — Ambulatory Visit: Payer: Medicare Other | Admitting: Physical Therapy

## 2022-12-04 DIAGNOSIS — R269 Unspecified abnormalities of gait and mobility: Secondary | ICD-10-CM

## 2022-12-04 DIAGNOSIS — R2689 Other abnormalities of gait and mobility: Secondary | ICD-10-CM

## 2022-12-04 DIAGNOSIS — R278 Other lack of coordination: Secondary | ICD-10-CM

## 2022-12-04 DIAGNOSIS — R2681 Unsteadiness on feet: Secondary | ICD-10-CM

## 2022-12-04 DIAGNOSIS — M6281 Muscle weakness (generalized): Secondary | ICD-10-CM

## 2022-12-04 DIAGNOSIS — R262 Difficulty in walking, not elsewhere classified: Secondary | ICD-10-CM

## 2022-12-04 NOTE — Therapy (Signed)
OUTPATIENT PHYSICAL THERAPY NEURO TREATMENT     Patient Name: Gregory Crane MRN: [id number] DOB:18-Jun-1971, 52 y.o., male Today's Date: 12/04/2022  PCP: Dale Samnorwood MD REFERRING PROVIDER: Dale Malta Bend MD  PT End of Session - 12/04/22 [phone]     Visit Number 41    Number of Visits 60    Date for PT Re-Evaluation 02/03/23    Authorization Type Medicare    Authorization Time Period 11/11/22-02/03/23    Progress Note Due on Visit 50    PT Start Time 0808    PT Stop Time 0855    PT Time Calculation (min) 47 min    Equipment Utilized During Treatment Gait belt    Activity Tolerance Patient tolerated treatment well;No increased pain;Patient limited by fatigue    Behavior During Therapy [facility] for tasks assessed/performed                  Past Medical History:  Diagnosis Date   Allergy    Anemia    Bell's palsy    Diabetes mellitus without complication    diet controlled   Hypertension    Hypothyroidism    Kidney stones    Pseudotumor cerebri    Stroke    Past Surgical History:  Procedure Laterality Date   COLONOSCOPY WITH PROPOFOL N/A 03/30/2020   Procedure: COLONOSCOPY WITH PROPOFOL;  Surgeon: Regis Bill, MD;  Location: [facility] ENDOSCOPY;  Service: Endoscopy;  Laterality: N/A;   LOOP RECORDER INSERTION N/A 01/27/2018   Procedure: LOOP RECORDER INSERTION;  Surgeon: Duke Salvia, MD;  Location: [facility] INVASIVE CV LAB;  Service: Cardiovascular;  Laterality: N/A;   LUMBAR PUNCTURE     as child   NO PAST SURGERIES     TEE WITHOUT CARDIOVERSION N/A 01/07/2018   Procedure: TRANSESOPHAGEAL ECHOCARDIOGRAM (TEE);  Surgeon: Antonieta Iba, MD;  Location: [facility] ORS;  Service: Cardiovascular;  Laterality: N/A;   Patient Active Problem List   Diagnosis Date Noted   Unsteady gait 10/05/2022   Hydronephrosis, left 05/04/2022   Type II diabetes mellitus with renal manifestations 05/04/2022   Leukocytosis 05/04/2022   Pre-op evaluation 04/24/2022   Headache  07/07/2021   Hearing loss 07/07/2021   Open wound 01/25/2021   History of colon polyps 10/15/2020   Postoperative hemorrhage involving digestive system following digestive system procedure 09/19/2020   Acute cholecystitis without calculus 09/09/2020   Type 2 diabetes mellitus, with long-term current use of insulin 09/09/2020   Cryptogenic stroke 07/13/2020   History of loop recorder 07/13/2020   Weakness 06/18/2020   History of 2019 novel coronavirus disease (COVID-19) 05/20/2020   ESRD (end stage renal disease) 04/04/2020   Pneumonia due to COVID-19 virus 04/03/2020   AKI (acute kidney injury) 04/03/2020   Elevated troponin 04/03/2020   Acquired trigger finger 06/08/2019   Lymphedema 06/08/2019   Anemia 04/17/2019   Swelling of left lower extremity 01/10/2019   Facial droop 04/09/2018   Daytime somnolence 03/30/2018   Carotid artery disease 02/03/2018   Intracranial vascular stenosis 09/12/2017   Cough 01/20/2017   Bell's palsy 11/10/2016   History of CVA (cerebrovascular accident) 11/10/2016   Benign localized hyperplasia of prostate with urinary obstruction 10/27/2016   History of nephrolithiasis 10/27/2016   TIA (transient ischemic attack) 10/20/2016   Near syncope 06/23/2016   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   Neuropathy 08/06/2015   Health care maintenance 08/06/2015   Essential hypertension 08/06/2015   Heme positive stool 10/10/2013   Hypothyroidism 10/10/2013   Microalbuminuria 10/10/2013   Hyperlipidemia  10/10/2013   B12 deficiency 10/10/2013   Diabetes 07/04/2013   Environmental allergies 07/04/2013   ONSET DATE: 2-3 years  REFERRING DIAG: Neuropathy  THERAPY DIAG:  Muscle weakness (generalized)  Other lack of coordination  Unsteadiness on feet  Abnormality of gait and mobility  Difficulty in walking, not elsewhere classified  Rationale for Evaluation and Treatment Rehabilitation  SUBJECTIVE:                                                                                                                                                                                              SUBJECTIVE STATEMENT: Pt  arrived early to PT treatment. Able to to be seen by this PT. Pt reports feeling a little woozy today, but otherwise no other updates. Pt reports that he may have had a little extra fluid removed in dialysis yesterday compared to usual.    Pt accompanied by: self  PERTINENT HISTORY: Patient presents to physical therapy for neuropathy. He underwent banding of his L UE fistula on 05/13/22 and scheduled for additional banding on 06/07/22. 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 Patient reports his balance is very unsteady due to neuropathy in feet, feels weak in LE's.   PAIN:  No pain today, just soreness in legs  PRECAUTIONS: Fall  WEIGHT BEARING RESTRICTIONS Yes no lifting >5 lb in arm   FALLS: Has patient fallen in last 6 months? No  PLOF: Independent  PATIENT GOALS to be more steady and walk as normally as possible.   OBJECTIVE:   TODAY'S TREATMENT:  -AA/ROM on Nustep, seat 9, Right arms 9, left arm 9, x 5minutes  Cues for neutral hip ER/IR on the RLE  Pt reports feeling "woozy" today.  Orthostatic VS assessment. Supine: 142/68(88) 65  Sitting 118/76(86) HR 68 Standing 0 min:  95/57(70) HR 77 pt symptomatic 3-4/10 lightheaded Standing 2 min: 97/54 (HR) 81  PT encouraged to pt hydrate and eat within recommended amounts by nephrology to alleviate orthostatic s/s   Pt performed supine therex for BLE strengthening.  SAQ 5# ankle weight Isometric hip adduction x 10 with 3 sec hold SLR 5# ankle weight x10 Ankle PF x 15 GTB Ankle DF GTTB x 15  Sitting HS curl GTB x 10  Leg press 70# x 12.     PATIENT EDUCATION: Education details: HEP,  goals, POC. Pt educated throughout session about proper posture and technique with exercises. Improved exercise technique, movement at target joints, use of target muscles after min  to mod verbal, visual, tactile cues.  Person educated: Patient Education method: Explanation, Demonstration, Tactile cues, Verbal cues, and Handouts Education comprehension: verbalized understanding, returned demonstration, verbal cues required, and tactile cues required  HOME EXERCISE PROGRAM: Access Code: [email or web address] URL: [email or web address] Date: 06/03/2022 Prepared by: Precious Bard  Exercises - Seated Heel Toe Raises  - 1 x daily - 7 x weekly - 2 sets - 10 reps - 5 hold - Standing Tandem Balance with Counter Support  - 1 x daily - 7 x weekly - 2 sets - 2 reps - 30 hold - Standing March with Counter Support  - 1 x daily - 7 x weekly - 2 sets - 10 reps - 5 hold   GOALS: Goals reviewed with patient? Yes   SHORT TERM GOALS: Target date: 07/01/2022  Patient will be independent in home exercise program to improve strength/mobility for better functional independence with ADLs. Baseline:10/9; HEP given 2/21: HEP compliant  Goal status: MET   LONG TERM GOALS: Target date: 02/03/2023  Patient will increase FOTO score to equal to or greater than   63%  to demonstrate statistically significant improvement in mobility and quality of life.  Baseline: 10/9: 53% 11/29: 58% 12/27: 51% 1/17: 56% 2/21: 53% 3/18: 60%  4/8 58% Goal status: IN PROGRESS  2.  Patient (< 62 years old) will complete five times sit to stand test in < 10 seconds without UE support indicating an increased LE strength and improved balance. Baseline: 10/9: 19.6 seconds with walking stick; one LOB 11/29: 20.56 seconds 12/27: 13.4 seconds hands on knees 1/17: 13 seconds hands on knees  2/21: 14 seconds no hands 3/18: 12 seconds no hands. 4/8 11.3 sec UE on knees.  Goal status: IN PROGRESS  3.  Patient will increase Berg Balance score  by > 45/56 to demonstrate decreased fall risk during functional activities. Baseline: 10/9: 33/56 11/29: 42/68 34/19: 62/22 9/79:  89/21 2/21: 46/56 3/18: 48/56  Goal status: MET  +4.  Patient will increase 10 meter walk test to >1.42m/s as to improve gait speed for better community ambulation and to reduce fall risk. Baseline: 11/29: 1.19 m/s Goal status: MET  5.  Patient will increase ABC scale score >80% to demonstrate better functional mobility and better confidence with ADLs.  Baseline: 10/9: 67% 11/29: 75.6% 12/27: 78%  2/21: 78% 3/18: 80% Goal status: MET  6. Patient will tolerate 5 seconds of single leg stance without loss of balance to improve ability to get in and out of shower safely. Baseline: 11/29: unable to stand on single leg safely 12/27: 2 seconds 1/17: 3 seconds 2/21: 3 seconds 3/18: 4 seconds. 4/8: 3 sec   Goal status: IN PROGRESS   6. Patient will increase six minute walk test distance to >1000 for progression to community ambulator and improve gait ability Baseline: 12/27: 845 ft with with walking stick 1/17: 753ft 2/21: 930 ft  3/18: 950 ft 4/8: 112ft Goal status: met  7. Patient will increase dynamic gait index score to >19/24 as to demonstrate reduced fall risk and improved dynamic gait balance for better safety with community/home ambulation.  Baseline: 3/18:  9/24. 4/8: 16/24 Goal status: in Progress   ASSESSMENT:  CLINICAL IMPRESSION: Pt tolerated treatment well. Noted orthostatic hypotension on this day. Pt encouraged to hydrate within prescribed parameters from dialysis and notify MD if s/s continue or worsen. PT treatment focused on BLE strengthening to limit time in standing with low BP on this day. Continue to monitor as  appropriate. Will continue to advance balance and strength interventions as appropriate. Pt will continue to benefit from skilled PT to make progress in BLE strength, balance, and coordination to return to PLOF and improve QoL.    OBJECTIVE IMPAIRMENTS Abnormal gait, decreased activity tolerance, decreased balance, decreased coordination, decreased endurance, decreased mobility, difficulty walking, decreased strength, impaired flexibility, impaired sensation, improper body mechanics, and pain.   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   REHAB POTENTIAL: Good  CLINICAL DECISION MAKING: Evolving/moderate complexity  EVALUATION COMPLEXITY: Moderate  PLAN: PT FREQUENCY: 2x/week  PT DURATION: 12 weeks  PLANNED INTERVENTIONS: Therapeutic exercises, Therapeutic activity, Neuromuscular re-education, Balance training, Gait training, Patient/Family education, Self Care, Joint mobilization, Stair training, Vestibular training, Canalith repositioning, Visual/preceptual remediation/compensation, DME instructions, Dry Needling, Cognitive remediation, Spinal mobilization, Cryotherapy, Moist heat, Manual lymph drainage, Compression bandaging, Taping, Vasopneumatic device, Ultrasound, Manual therapy, and Re-evaluation  PLAN FOR NEXT SESSION:  BLE strength, continue to progress dynamic balance and dual tasking balance. Continue to monitor BP as appropriate   Grier Rocher PT, DPT  Physical Therapist - [facility]  [facility]  [facility]  9:20 AM 12/04/22

## 2022-12-06 ENCOUNTER — Encounter: Payer: Self-pay | Admitting: Occupational Therapy

## 2022-12-06 NOTE — Therapy (Signed)
OUTPATIENT OCCUPATIONAL THERAPY NEURO TREATMENT  Patient Name: Gregory Crane MRN: [id number] DOB:1971/01/31, 52 y.o., male  PCP: Dale [place] REFERRING PROVIDER: Dale [place]   END OF SESSION:  OT End of Session - 12/06/22 2123     Visit Number 8    Number of Visits 24    Date for OT Re-Evaluation 01/13/23    OT Start Time 1101    OT Stop Time 1147    OT Time Calculation (min) 46 min    Activity Tolerance Patient tolerated treatment well    Behavior During Therapy [facility] for tasks assessed/performed              Past Medical History:  Diagnosis Date   Allergy    Anemia    Bell's palsy    Diabetes mellitus without complication    diet controlled   Hypertension    Hypothyroidism    Kidney stones    Pseudotumor cerebri    Stroke    Past Surgical History:  Procedure Laterality Date   COLONOSCOPY WITH PROPOFOL N/A 03/30/2020   Procedure: COLONOSCOPY WITH PROPOFOL;  Surgeon: Regis Bill, MD;  Location: [facility] ENDOSCOPY;  Service: Endoscopy;  Laterality: N/A;   LOOP RECORDER INSERTION N/A 01/27/2018   Procedure: LOOP RECORDER INSERTION;  Surgeon: Duke Salvia, MD;  Location: [facility] INVASIVE CV LAB;  Service: Cardiovascular;  Laterality: N/A;   LUMBAR PUNCTURE     as child   NO PAST SURGERIES     TEE WITHOUT CARDIOVERSION N/A 01/07/2018   Procedure: TRANSESOPHAGEAL ECHOCARDIOGRAM (TEE);  Surgeon: Antonieta Iba, MD;  Location: [facility] ORS;  Service: Cardiovascular;  Laterality: N/A;   Patient Active Problem List   Diagnosis Date Noted   Unsteady gait 10/05/2022   Hydronephrosis, left 05/04/2022   Type II diabetes mellitus with renal manifestations 05/04/2022   Leukocytosis 05/04/2022   Pre-op evaluation 04/24/2022   Headache 07/07/2021   Hearing loss 07/07/2021   Open wound 01/25/2021   History of colon polyps 10/15/2020   Postoperative hemorrhage involving digestive system following digestive system procedure 09/19/2020   Acute cholecystitis without  calculus 09/09/2020   Type 2 diabetes mellitus, with long-term current use of insulin 09/09/2020   Cryptogenic stroke 07/13/2020   History of loop recorder 07/13/2020   Weakness 06/18/2020   History of 2019 novel coronavirus disease (COVID-19) 05/20/2020   ESRD (end stage renal disease) 04/04/2020   Pneumonia due to COVID-19 virus 04/03/2020   AKI (acute kidney injury) 04/03/2020   Elevated troponin 04/03/2020   Acquired trigger finger 06/08/2019   Lymphedema 06/08/2019   Anemia 04/17/2019   Swelling of left lower extremity 01/10/2019   Facial droop 04/09/2018   Daytime somnolence 03/30/2018   Carotid artery disease 02/03/2018   Intracranial vascular stenosis 09/12/2017   Cough 01/20/2017   Bell's palsy 11/10/2016   History of CVA (cerebrovascular accident) 11/10/2016   Benign localized hyperplasia of prostate with urinary obstruction 10/27/2016   History of nephrolithiasis 10/27/2016   TIA (transient ischemic attack) 10/20/2016   Near syncope 06/23/2016   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   Neuropathy 08/06/2015   Health care maintenance 08/06/2015   Essential hypertension 08/06/2015   Heme positive stool 10/10/2013   Hypothyroidism 10/10/2013   Microalbuminuria 10/10/2013   Hyperlipidemia 10/10/2013   B12 deficiency 10/10/2013   Diabetes 07/04/2013   Environmental allergies 07/04/2013    ONSET DATE: May 2023  REFERRING DIAG: CVA  THERAPY DIAG:  Muscle weakness (generalized)  Other lack of coordination  Unsteadiness on feet  Other abnormalities of gait and mobility  Difficulty in walking, not elsewhere classified  Abnormality of gait and mobility  Rationale for Evaluation and Treatment: Rehabilitation  SUBJECTIVE:  SUBJECTIVE STATEMENT: Pt reports he thought his appointment was earlier this morning so he came to therapy and realized his appt wasn't until later.  PT was able to go ahead and see patient but he had to return later for OT appt time.  Reports  increased fatigue this week, dialysis days have been difficult and tiring, reports a lot of fluid taken off.    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    PRECAUTIONS: Fall and Other: No strengthening exercises greater than 5-10 pounds in left arm.   Dialysis Tues, Th, Sat.  WEIGHT BEARING RESTRICTIONS: No  PAIN:  Are you having pain? No  FALLS: Has patient fallen in last 6 months? No  LIVING ENVIRONMENT: Lives with: alone Lives in: House/apartment Stairs: Yes: Internal: 15 steps; on right going up Has following equipment at home: Single point cane and Walker - 2 wheeled  PLOF: Independent  PATIENT GOALS: Pt reports he wants to have better use of his left hand, be able to do buttons and shoes.   OBJECTIVE:   HAND DOMINANCE: Right  ADLs: Overall ADLs: modified independent with most tasks Transfers/ambulation related to ADLs: Now walking without an assistive device, used cane in the past.  Eating: Independent with self feeding, difficulty with cutting food, requires increased focus and effort. Grooming: Independent UB Dressing: Difficulty with buttons, uses pullovers most of the time  LB Dressing: Difficulty with tying shoes Toileting: Independent Bathing: Independent Tub Shower transfers: Independent Equipment: none  IADLs: Shopping: independent,  uses a cart to push in the store Light housekeeping: modified independent Meal Prep: difficulty with tasks requiring use of left hand, bilateral hand use.   Community mobility: Ambulates without use of assistive device, able to  drive Medication management: Independent Financial management: Independent Handwriting: 90% legible  MOBILITY STATUS: Needs Assist: Pt has progressed with PT, used to use a cane but ambulating without an assistive device but still has balance difficulties.    POSTURE COMMENTS:    Sitting balance: Sits without UE support up to 30 sec  ACTIVITY TOLERANCE: Activity tolerance: limited activity tolerance  FUNCTIONAL OUTCOME MEASURES: FOTO: 53  UPPER EXTREMITY ROM:    Active ROM Right eval Left eval  Shoulder flexion [facility]  Shoulder abduction [facility]  Shoulder adduction [facility]  Shoulder extension [facility]  Shoulder internal rotation [facility]  Shoulder external rotation [facility]  Elbow flexion [facility] WFL  Elbow extension [facility]  Wrist flexion [facility] WFL  Wrist extension [facility] WFL  Wrist ulnar deviation [facility] WFL  Wrist radial deviation WFL WFL  Wrist pronation WFL WFL  Wrist supination WFL WFL  (Blank rows = not tested)  UPPER EXTREMITY MMT:     MMT Right eval Left eval  Shoulder flexion 5/5 3+/5  Shoulder abduction 5/5 3+/5  Shoulder adduction    Shoulder extension    Shoulder internal rotation    Shoulder external rotation    Middle trapezius    Lower  trapezius    Elbow flexion 5/5 3+/5  Elbow extension 5/5 3+/5  Wrist flexion 5/5 3+/5  Wrist extension 5/5 3+/5  Wrist ulnar deviation 5/5 3+/5  Wrist radial deviation 5/5 3+/5  Wrist pronation 5/5 3+/5  Wrist supination 5/5 3+/5  (Blank rows = not tested)  Opposition of thumb to all digits on right, on left hand opposition to ring finger but cannot demonstrate to small finger.   HAND FUNCTION: Grip strength: Right: 44 lbs; Left: 35 lbs, Lateral pinch: Right: 17 lbs, Left: 16 lbs, 3 point pinch: Right: 14 lbs, Left: 0 lbs, and Tip pinch: Right 2 lbs, Left: 0 lbs  COORDINATION: 9 Hole Peg test: Right: 36 sec; Left: 56 sec  TODAY'S TREATMENT:                                                                                                                               Therapeutic Exercises: Pt seen for finger strengthening this date with use of placing push pins in moderate resistive board, cues for prehension patterns and to push pins flush into board.  Removing with cues for prehension patterns and occasional tactile cues for proper prehension for index and thumb.  Reaching tasks multi directional with use of wedge to increase reaching patterns.   Neuromuscular Reeducation:  Pt manipulating jumbo pegs to place into judy board and following pattern design.  Cues for prehension patterns as well as manipulation skills to turn and flip from one end to the other.  Pt dropping items occasionally and required assist for retrieving items from floor level.  Pt required occasional rest breaks during tasks with hand stretch and weight bearing.       PATIENT EDUCATION: Education details: fine motor coordination, manipulation skills, L hand strengthening Person educated: Patient Education method: Explanation Education comprehension: verbalized understanding  HOME EXERCISE PROGRAM: Will provide HEP in 1-2 visits  GOALS: Goals reviewed with patient? Yes  SHORT TERM GOALS: Target date:  11/18/2022   Pt will demonstrate ability to hold and utilize utensils for cutting meat with modified independence. Baseline: difficulty with cutting, stabilizing item with fork for cutting. Goal status: INITIAL  2.  Pt will demonstrate ability to obtain keys from left pocket without difficult and without dropping.   Baseline: pt struggles with reaching into left pocket to get keys.   Goal status: INITIAL   LONG TERM GOALS: Target date: 01/13/2023   Pt will demonstrate HEP with modified independence.   Baseline: no current program for left UE.  Goal status: INITIAL  2.  Pt will improve left UE coordination by 10 secs on 9 hole peg test to demonstrate shoe tying with modified independence.  Baseline: currently wearing slip  on shoes and unable to tie shoes, left 9 hole peg test in 56 sec at eval  Goal status: INITIAL  3.  Pt will demonstrate improved coordination in left hand to demonstrate buttoning buttons on clothing with modified independence.  Baseline: difficulty with buttons and has been wearing more pullovers to avoid challenge of buttons. Goal status: INITIAL  4.  Pt will demonstrate improvement in left grip strength by 5# to open jars and containers with modified independence and greater ease.   Baseline: Difficulty with opening jars and containers with use of left hand at eval.  Goal status: INITIAL  5.  Pt will improve left UE strength by 1 mm grade to hold and hand wash dishes with modified independence.  Baseline: difficulty with being able to hand wash dishes at eval, difficulty stabilizing dish. Goal status: INITIAL  6.  Pt will demonstrate FOTO score of 59 or greater to show a clinically relevant change in LUE to impact greater independence with daily ADL and IADL tasks at home and in the community.   Baseline:  Goal status: INITIAL  ASSESSMENT: CLINICAL IMPRESSION: Pt with increased fatigue this week and reports difficult dialysis days with lots of fluid being pulled off of kidneys.  Pt requires occasional rest breaks with left hand with stretching and weight bearing as needed during activity.  Difficulty with prehension patterns for index and thumb tip to tip prehension to pick up and manipulate items to place into grid.  Difficulty with strength in fingers to place pins into board using a tip to tip pattern, tends to use a lateral pinch to be able to perform task.  He could remove pins with tip to tip prehension with cues.  Continue to work towards goals in plan of care to maximize safety and independence in necessary daily tasks at home and in the community.    PERFORMANCE DEFICITS: in functional skills including ADLs, IADLs, coordination, dexterity, sensation, tone, ROM, strength,  flexibility, Fine motor control, balance, endurance, decreased knowledge of use of DME, and UE functional use, cognitive skills including, and psychosocial skills including environmental adaptation, habits, and routines and behaviors.   IMPAIRMENTS: are limiting patient from ADLs, IADLs, and social participation.   CO-MORBIDITIES: has co-morbidities such as CAD, ESRD with dialysis, deafness in right ear, diabetic retinopathy, HTN, neuropathy  that affects occupational performance. Patient will benefit from skilled OT to address above impairments and improve overall function.  MODIFICATION OR ASSISTANCE TO COMPLETE EVALUATION: Min-Moderate modification of tasks or assist with assess necessary to complete an evaluation.  OT OCCUPATIONAL PROFILE AND HISTORY: Comprehensive assessment: Review of records and extensive additional review of physical, cognitive, psychosocial history related to current functional performance.  CLINICAL DECISION MAKING: Moderate - several treatment options, min-mod task modification necessary  REHAB POTENTIAL: Good  EVALUATION COMPLEXITY: Moderate    PLAN:  OT FREQUENCY: 2x/week  OT DURATION: 12 weeks  PLANNED INTERVENTIONS: self care/ADL training, therapeutic exercise, therapeutic activity, neuromuscular re-education, manual therapy, balance training, paraffin, moist heat, contrast bath, patient/family education, and DME and/or AE instructions  RECOMMENDED OTHER SERVICES: Pt is being seen by PT currently.   CONSULTED AND AGREED WITH PLAN OF CARE: Patient  PLAN FOR NEXT SESSION: Initiate HEP for grip, pinch and coordination skills.   Ramelo Oetken Cornelius Moras, OTR/L, CLT  Phil Michels, OT 12/06/2022, 9:24 PM

## 2022-12-09 ENCOUNTER — Ambulatory Visit: Payer: Medicare Other

## 2022-12-09 ENCOUNTER — Ambulatory Visit: Payer: Medicare Other | Admitting: Physical Therapy

## 2022-12-09 DIAGNOSIS — R278 Other lack of coordination: Secondary | ICD-10-CM

## 2022-12-09 DIAGNOSIS — M6281 Muscle weakness (generalized): Secondary | ICD-10-CM | POA: Diagnosis not present

## 2022-12-09 DIAGNOSIS — R2689 Other abnormalities of gait and mobility: Secondary | ICD-10-CM

## 2022-12-09 DIAGNOSIS — R2681 Unsteadiness on feet: Secondary | ICD-10-CM

## 2022-12-09 DIAGNOSIS — R262 Difficulty in walking, not elsewhere classified: Secondary | ICD-10-CM

## 2022-12-09 NOTE — Therapy (Signed)
OUTPATIENT PHYSICAL THERAPY NEURO TREATMENT     Patient Name: Gregory Crane MRN: [id number] DOB:05-Nov-1970, 52 y.o., male Today's Date: 12/09/2022  PCP: Dale Littlefork MD REFERRING PROVIDER: Dale  MD  PT End of Session - 12/09/22 1025     Visit Number 42    Number of Visits 60    Date for PT Re-Evaluation 02/03/23    Authorization Type Medicare    Authorization Time Period 11/11/22-02/03/23    Progress Note Due on Visit 50    PT Start Time 1023    PT Stop Time 1100    PT Time Calculation (min) 37 min    Equipment Utilized During Treatment Gait belt    Activity Tolerance Patient tolerated treatment well;No increased pain;Patient limited by fatigue    Behavior During Therapy [facility] for tasks assessed/performed                   Past Medical History:  Diagnosis Date   Allergy    Anemia    Bell's palsy    Diabetes mellitus without complication    diet controlled   Hypertension    Hypothyroidism    Kidney stones    Pseudotumor cerebri    Stroke    Past Surgical History:  Procedure Laterality Date   COLONOSCOPY WITH PROPOFOL N/A 03/30/2020   Procedure: COLONOSCOPY WITH PROPOFOL;  Surgeon: Regis Bill, MD;  Location: [facility] ENDOSCOPY;  Service: Endoscopy;  Laterality: N/A;   LOOP RECORDER INSERTION N/A 01/27/2018   Procedure: LOOP RECORDER INSERTION;  Surgeon: Duke Salvia, MD;  Location: [facility] INVASIVE CV LAB;  Service: Cardiovascular;  Laterality: N/A;   LUMBAR PUNCTURE     as child   NO PAST SURGERIES     TEE WITHOUT CARDIOVERSION N/A 01/07/2018   Procedure: TRANSESOPHAGEAL ECHOCARDIOGRAM (TEE);  Surgeon: Antonieta Iba, MD;  Location: [facility] ORS;  Service: Cardiovascular;  Laterality: N/A;   Patient Active Problem List   Diagnosis Date Noted   Unsteady gait 10/05/2022   Hydronephrosis, left 05/04/2022   Type II diabetes mellitus with renal manifestations 05/04/2022   Leukocytosis 05/04/2022   Pre-op evaluation 04/24/2022   Headache  07/07/2021   Hearing loss 07/07/2021   Open wound 01/25/2021   History of colon polyps 10/15/2020   Postoperative hemorrhage involving digestive system following digestive system procedure 09/19/2020   Acute cholecystitis without calculus 09/09/2020   Type 2 diabetes mellitus, with long-term current use of insulin 09/09/2020   Cryptogenic stroke 07/13/2020   History of loop recorder 07/13/2020   Weakness 06/18/2020   History of 2019 novel coronavirus disease (COVID-19) 05/20/2020   ESRD (end stage renal disease) 04/04/2020   Pneumonia due to COVID-19 virus 04/03/2020   AKI (acute kidney injury) 04/03/2020   Elevated troponin 04/03/2020   Acquired trigger finger 06/08/2019   Lymphedema 06/08/2019   Anemia 04/17/2019   Swelling of left lower extremity 01/10/2019   Facial droop 04/09/2018   Daytime somnolence 03/30/2018   Carotid artery disease 02/03/2018   Intracranial vascular stenosis 09/12/2017   Cough 01/20/2017   Bell's palsy 11/10/2016   History of CVA (cerebrovascular accident) 11/10/2016   Benign localized hyperplasia of prostate with urinary obstruction 10/27/2016   History of nephrolithiasis 10/27/2016   TIA (transient ischemic attack) 10/20/2016   Near syncope 06/23/2016   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   Neuropathy 08/06/2015   Health care maintenance 08/06/2015   Essential hypertension 08/06/2015   Heme positive stool 10/10/2013   Hypothyroidism 10/10/2013   Microalbuminuria 10/10/2013  Hyperlipidemia 10/10/2013   B12 deficiency 10/10/2013   Diabetes 07/04/2013   Environmental allergies 07/04/2013   ONSET DATE: 2-3 years  REFERRING DIAG: Neuropathy  THERAPY DIAG:  Muscle weakness (generalized)  Other lack of coordination  Unsteadiness on feet  Difficulty in walking, not elsewhere classified  Other abnormalities of gait and mobility  Rationale for Evaluation and Treatment Rehabilitation  SUBJECTIVE:                                                                                                                                                                                              SUBJECTIVE STATEMENT: Pt running a few minutes late to PT treatment. Family is in town helping with household management, causing him to leave a few minutes behind schedule. No pain reported throughout session. No reports of "wooziness" on this day   Pt accompanied by: self  PERTINENT HISTORY: Patient presents to physical therapy for neuropathy. He underwent banding of his L UE fistula on 05/13/22 and scheduled for additional banding on 06/07/22. 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 Patient reports his balance is very unsteady due to neuropathy in feet, feels weak in LE's.   PAIN:  No pain today, just soreness in legs  PRECAUTIONS: Fall  WEIGHT BEARING RESTRICTIONS Yes no lifting >5 lb in arm   FALLS: Has patient fallen in last 6 months? No  PLOF: Independent  PATIENT GOALS to be more steady and walk as normally as possible.   OBJECTIVE:   TODAY'S TREATMENT:  -AA/ROM on Nustep, seat 9, Right arms 9, left arm 9, x  Cues for neutral hip ER/IR on the RLE  Standing on Rocker board:  AP control: Forward/reverse 2x15  Squat 2 x 12  No UE support 2 x 30 sec.  Lateral control  Static stance 2 x 45 sec with intermittent UE support.  Squat with 2 UE support x 10 Squat with 1 UE support x 10 BUE.  Static stance to sort maget letters by color x 2 min with alternating UE per color. 1 UE supported on rail throughout  Pt required multiple seated therapeutic rest break throughout session.    PATIENT EDUCATION: Education details: HEP, goals, POC. Pt educated throughout session about proper posture and technique with exercises. Improved exercise technique,  movement at target joints, use of target muscles after min to mod verbal, visual, tactile cues.  Person educated: Patient Education method: Explanation, Demonstration, Tactile cues, Verbal cues, and Handouts Education comprehension: verbalized understanding,  returned demonstration, verbal cues required, and tactile cues required  HOME EXERCISE PROGRAM: Access Code: [email or web address] URL: [email or web address] Date: 06/03/2022 Prepared by: Precious Bard  Exercises - Seated Heel Toe Raises  - 1 x daily - 7 x weekly - 2 sets - 10 reps - 5 hold - Standing Tandem Balance with Counter Support  - 1 x daily - 7 x weekly - 2 sets - 2 reps - 30 hold - Standing March with Counter Support  - 1 x daily - 7 x weekly - 2 sets - 10 reps - 5 hold   GOALS: Goals reviewed with patient? Yes   SHORT TERM GOALS: Target date: 07/01/2022  Patient will be independent in home exercise program to improve strength/mobility for better functional independence with ADLs. Baseline:10/9; HEP given 2/21: HEP compliant  Goal status: MET   LONG TERM GOALS: Target date: 02/03/2023  Patient will increase FOTO score to equal to or greater than   63%  to demonstrate statistically significant improvement in mobility and quality of life.  Baseline: 10/9: 53% 11/29: 58% 12/27: 51% 1/17: 56% 2/21: 53% 3/18: 60%  4/8 58% Goal status: IN PROGRESS  2.  Patient (< 26 years old) will complete five times sit to stand test in < 10 seconds without UE support indicating an increased LE strength and improved balance. Baseline: 10/9: 19.6 seconds with walking stick; one LOB 11/29: 20.56 seconds 12/27: 13.4 seconds hands on knees 1/17: 13 seconds hands on knees  2/21: 14 seconds no hands 3/18: 12 seconds no hands. 4/8 11.3 sec UE on knees.  Goal status: IN PROGRESS  3.  Patient will increase Berg Balance score by > 45/56 to demonstrate decreased fall risk during functional activities. Baseline: 10/9: 33/56 11/29: 19/14 78/29:  56/21 3/08:  65/78 2/21: 46/56 3/18: 48/56  Goal status: MET  +4.  Patient will increase 10 meter walk test to >1.67m/s as to improve gait speed for better community ambulation and to reduce fall risk. Baseline: 11/29: 1.19 m/s Goal status: MET  5.  Patient will increase ABC scale score >80% to demonstrate better functional mobility and better confidence with ADLs.  Baseline: 10/9: 67% 11/29: 75.6% 12/27: 78%  2/21: 78% 3/18: 80% Goal status: MET  6. Patient will tolerate 5 seconds of single leg stance without loss of balance to improve ability to get in and out of shower safely. Baseline: 11/29: unable to stand on single leg safely 12/27: 2 seconds 1/17: 3 seconds 2/21: 3 seconds 3/18: 4 seconds. 4/8: 3 sec   Goal status: IN PROGRESS   6. Patient will increase six minute walk test distance to >1000 for progression to community ambulator and improve gait ability Baseline: 12/27: 845 ft with with walking stick 1/17: 769ft 2/21: 930 ft  3/18: 950 ft 4/8: 1145ft Goal status: met  7. Patient will increase dynamic gait index score to >19/24 as to demonstrate reduced fall risk and improved dynamic gait balance for better safety with community/home ambulation.  Baseline: 3/18:  9/24. 4/8: 16/24 Goal status: in Progress   ASSESSMENT:  CLINICAL IMPRESSION: Pt tolerated treatment well. No orthostatic s/s today. Pt limited by time slightly on this day. PT treatment focused on dynamic strengthening and balance on this day to force improved ankle strategy to correct LOB. Pt will continue to benefit from skilled PT to make progress in BLE strength, balance, and coordination to return to PLOF and improve QoL.   OBJECTIVE IMPAIRMENTS Abnormal gait, decreased activity tolerance, decreased balance, decreased coordination,  decreased endurance, decreased mobility, difficulty walking, decreased strength, impaired flexibility, impaired sensation, improper body mechanics, and pain.   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   REHAB POTENTIAL: Good  CLINICAL DECISION MAKING: Evolving/moderate complexity  EVALUATION COMPLEXITY: Moderate  PLAN: PT FREQUENCY: 2x/week  PT DURATION: 12 weeks  PLANNED INTERVENTIONS: Therapeutic exercises, Therapeutic activity, Neuromuscular re-education, Balance training, Gait training, Patient/Family education, Self Care, Joint mobilization, Stair training, Vestibular training, Canalith repositioning, Visual/preceptual remediation/compensation, DME instructions, Dry Needling, Cognitive remediation, Spinal mobilization, Cryotherapy, Moist heat, Manual lymph drainage, Compression bandaging, Taping, Vasopneumatic device, Ultrasound, Manual therapy, and Re-evaluation  PLAN FOR NEXT SESSION:   BLE strength, continue to progress dynamic balance and dual tasking balance. Continue to monitor BP as appropriate   Grier Rocher PT, DPT  Physical Therapist - [name]  11:46 AM 12/09/22

## 2022-12-09 NOTE — Therapy (Signed)
OUTPATIENT OCCUPATIONAL THERAPY NEURO TREATMENT  Patient Name: Gregory Crane MRN: [id number] DOB:[birth year], 52 y.o., male  PCP: Dale De [place]et REFERRING PROVIDER: Dale [place]   END OF SESSION:  OT End of Session - 12/09/22 1115     Visit Number 9    Number of Visits 24    Date for OT Re-Evaluation 01/13/23    OT Start Time 1105    OT Stop Time 1145    OT Time Calculation (min) 40 min    Activity Tolerance Patient tolerated treatment well    Behavior During Therapy WFL for tasks assessed/performed              Past Medical History:  Diagnosis Date   Allergy    Anemia    Bell's palsy    Diabetes mellitus without complication    diet controlled   Hypertension    Hypothyroidism    Kidney stones    Pseudotumor cerebri    Stroke    Past Surgical History:  Procedure Laterality Date   COLONOSCOPY WITH PROPOFOL N/A 03/30/2020   Procedure: COLONOSCOPY WITH PROPOFOL;  Surgeon: Regis Bill, MD;  Location: [facility] ENDOSCOPY;  Service: Endoscopy;  Laterality: N/A;   LOOP RECORDER INSERTION N/A 01/27/2018   Procedure: LOOP RECORDER INSERTION;  Surgeon: Duke Salvia, MD;  Location: [facility] INVASIVE CV LAB;  Service: Cardiovascular;  Laterality: N/A;   LUMBAR PUNCTURE     as child   NO PAST SURGERIES     TEE WITHOUT CARDIOVERSION N/A 01/07/2018   Procedure: TRANSESOPHAGEAL ECHOCARDIOGRAM (TEE);  Surgeon: Antonieta Iba, MD;  Location: [facility] ORS;  Service: Cardiovascular;  Laterality: N/A;   Patient Active Problem List   Diagnosis Date Noted   Unsteady gait 10/05/2022   Hydronephrosis, left 05/04/2022   Type II diabetes mellitus with renal manifestations 05/04/2022   Leukocytosis 05/04/2022   Pre-op evaluation 04/24/2022   Headache 07/07/2021   Hearing loss 07/07/2021   Open wound 01/25/2021   History of colon polyps 10/15/2020   Postoperative hemorrhage involving digestive system following digestive system procedure 09/19/2020   Acute cholecystitis without  calculus 09/09/2020   Type 2 diabetes mellitus, with long-term current use of insulin 09/09/2020   Cryptogenic stroke 07/13/2020   History of loop recorder 07/13/2020   Weakness 06/18/2020   History of 2019 novel coronavirus disease (COVID-19) 05/20/2020   ESRD (end stage renal disease) 04/04/2020   Pneumonia due to COVID-19 virus 04/03/2020   AKI (acute kidney injury) 04/03/2020   Elevated troponin 04/03/2020   Acquired trigger finger 06/08/2019   Lymphedema 06/08/2019   Anemia 04/17/2019   Swelling of left lower extremity 01/10/2019   Facial droop 04/09/2018   Daytime somnolence 03/30/2018   Carotid artery disease 02/03/2018   Intracranial vascular stenosis 09/12/2017   Cough 01/20/2017   Bell's palsy 11/10/2016   History of CVA (cerebrovascular accident) 11/10/2016   Benign localized hyperplasia of prostate with urinary obstruction 10/27/2016   History of nephrolithiasis 10/27/2016   TIA (transient ischemic attack) 10/20/2016   Near syncope 06/23/2016   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   Neuropathy 08/06/2015   Health care maintenance 08/06/2015   Essential hypertension 08/06/2015   Heme positive stool 10/10/2013   Hypothyroidism 10/10/2013   Microalbuminuria 10/10/2013   Hyperlipidemia 10/10/2013   B12 deficiency 10/10/2013   Diabetes 07/04/2013   Environmental allergies 07/04/2013    ONSET DATE: May 2023  REFERRING DIAG: CVA  THERAPY DIAG:  Muscle weakness (generalized)  Other lack of coordination  Rationale for Evaluation and  Treatment: Rehabilitation  SUBJECTIVE:  SUBJECTIVE STATEMENT: Pt reports he had to have 4 L of fluid taken off at dialysis on Saturday so he was feeling tired over the weekend but is feeling better today.   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    PRECAUTIONS: Fall and Other: No strengthening exercises greater than 5-10 pounds in left arm.   Dialysis Tues, Th, Sat.  WEIGHT BEARING RESTRICTIONS: No  PAIN:  Are you having pain? No  FALLS: Has patient fallen in last 6 months? No  LIVING ENVIRONMENT: Lives with: alone Lives in: House/apartment Stairs: Yes: Internal: 15 steps; on right going up Has following equipment at home: Single point cane and Walker - 2 wheeled  PLOF: Independent  PATIENT GOALS: Pt reports he wants to have better use of his left hand, be able to do buttons and shoes.   OBJECTIVE:   HAND DOMINANCE: Right  ADLs: Overall ADLs: modified independent with most tasks Transfers/ambulation related to ADLs: Now walking without an assistive device, used cane in the past.  Eating: Independent with self feeding, difficulty with cutting food, requires increased focus and effort. Grooming: Independent UB Dressing: Difficulty with buttons, uses pullovers most of the time  LB Dressing: Difficulty with tying shoes Toileting: Independent Bathing: Independent Tub Shower transfers: Independent Equipment: none  IADLs: Shopping: independent,  uses a cart to push in the store Light housekeeping: modified independent Meal Prep: difficulty with tasks requiring use of left hand, bilateral hand use.   Community mobility: Ambulates without use of assistive device, able to drive Medication management: Independent Financial management: Independent Handwriting: 90% legible  MOBILITY STATUS: Needs Assist: Pt has progressed with PT, used to use a cane but ambulating without an assistive device but still has balance difficulties.    POSTURE COMMENTS:    Sitting balance: Sits without UE support up to 30  sec  ACTIVITY TOLERANCE: Activity tolerance: limited activity tolerance  FUNCTIONAL OUTCOME MEASURES: FOTO: 53  UPPER EXTREMITY ROM:    Active ROM Right eval Left eval  Shoulder flexion [facility]  Shoulder abduction [facility]  Shoulder adduction [facility]  Shoulder extension [facility]  Shoulder internal rotation [facility]  Shoulder external rotation [facility]  Elbow flexion [facility] WFL  Elbow extension [facility]  Wrist flexion [facility] WFL  Wrist extension [facility] WFL  Wrist ulnar deviation [facility] WFL  Wrist radial deviation WFL WFL  Wrist pronation WFL WFL  Wrist supination WFL WFL  (Blank rows = not tested)  UPPER EXTREMITY MMT:     MMT Right eval Left eval  Shoulder flexion 5/5 3+/5  Shoulder abduction 5/5 3+/5  Shoulder adduction    Shoulder extension    Shoulder internal rotation    Shoulder external rotation    Middle trapezius    Lower trapezius    Elbow flexion 5/5 3+/5  Elbow extension 5/5 3+/5  Wrist flexion 5/5 3+/5  Wrist extension 5/5 3+/5  Wrist ulnar deviation 5/5 3+/5  Wrist radial deviation 5/5 3+/5  Wrist pronation 5/5 3+/5  Wrist supination 5/5 3+/5  (Blank rows = not tested)  Opposition of thumb to all digits  on right, on left hand opposition to ring finger but cannot demonstrate to small finger.   HAND FUNCTION: Grip strength: Right: 44 lbs; Left: 35 lbs, Lateral pinch: Right: 17 lbs, Left: 16 lbs, 3 point pinch: Right: 14 lbs, Left: 0 lbs, and Tip pinch: Right 2 lbs, Left: 0 lbs  COORDINATION: 9 Hole Peg test: Right: 36 sec; Left: 56 sec  TODAY'S TREATMENT:                                                                                                                              Therapeutic Exercises: Facilitated hand strengthening with use of hand gripper set at 23.4# to remove jumbo pegs from pegboard x3 trials using L hand. Rest between sets and min vc to ensure pt engaged L SF into each gripping rep.   Therapeutic Activity: Focus on L hand FMC/dexterity  skills.  Pt worked with jumbo pegs, gathering 1 at a time and storing up to 4 in hand, then worked on translatory skills to move the pegs from palm to fingertips in prep for discarding them 1 at a time into pegboard without dropping the others.  Pt practiced rotating pegs 180 degrees within fingertips, resting forearm on tabletop to isolate finger movements from forearm.  Pt worked on picking up glass stones from dish, storing in palm, and additional translatory movements moving [place] stones between palm and fingertips in prep for discarding stones 1 at a time into dish.  Practiced picking up glass stones from table top; flat side up is easier for pt than flat side down  PATIENT EDUCATION: Education details: fine motor coordination, manipulation skills, L hand strengthening Person educated: Patient Education method: Explanation Education comprehension: verbalized understanding  HOME EXERCISE PROGRAM: Will provide HEP in 1-2 visits  GOALS: Goals reviewed with patient? Yes  SHORT TERM GOALS: Target date:  11/18/2022   Pt will demonstrate ability to hold and utilize utensils for cutting meat with modified independence. Baseline: difficulty with cutting, stabilizing item with fork for cutting. Goal status: INITIAL  2.  Pt will demonstrate ability to obtain keys from left pocket without difficult and without dropping.   Baseline: pt struggles with reaching into left pocket to get keys.   Goal status: INITIAL   LONG TERM GOALS: Target date: 01/13/2023   Pt will demonstrate HEP with modified independence.   Baseline: no current program for left UE.  Goal status: INITIAL  2.  Pt will improve left UE coordination by 10 secs on 9 hole peg test to demonstrate shoe tying with modified independence.  Baseline: currently wearing slip on shoes and unable to tie shoes, left 9 hole peg test in 56 sec at eval  Goal status: INITIAL  3.  Pt will demonstrate improved coordination in left hand to  demonstrate buttoning buttons on clothing with modified independence.  Baseline: difficulty with buttons and has been wearing more pullovers to avoid challenge of buttons. Goal status: INITIAL  4.  Pt will demonstrate improvement in left grip strength by 5# to open jars and containers with modified independence and greater ease.   Baseline: Difficulty with opening jars and containers with use of left hand at eval.  Goal status: INITIAL  5.  Pt will improve left UE strength by 1 mm grade to hold and hand wash dishes with modified independence.  Baseline: difficulty with being able to hand wash dishes at eval, difficulty stabilizing dish. Goal status: INITIAL  6.  Pt will demonstrate FOTO score of 59 or greater to show a clinically relevant change in LUE to impact greater independence with daily ADL and IADL tasks at home and in the community.   Baseline:  Goal status: INITIAL  ASSESSMENT: CLINICAL IMPRESSION: Pt reported 4L of fluid had to be pulled off of him at dialysis on Saturday, so pt was tired over the weekend but feeling better today.  Continued focus on L hand strengthening and dexterity skills.  Pt tolerated 3 rounds with hand gripper set at 23.4 lbs to remove jumbo pegs from pegboard, occasionally dropping a peg on each round.  Pt required extra time for translatory skills and repositioning jumbo pegs within fingertips.  Pt reports that he still struggles to pick up coins from a table using L hand, and has to complete this task with his R hand.  Practiced picking up glass stones from table top; flat side up is easier for pt than flat side down.  Continue to work towards goals in plan of care to maximize safety and independence in necessary daily tasks at home and in the community.    PERFORMANCE DEFICITS: in functional skills including ADLs, IADLs, coordination, dexterity, sensation, tone, ROM, strength, flexibility, Fine motor control, balance, endurance, decreased knowledge of use of  DME, and UE functional use, cognitive skills including, and psychosocial skills including environmental adaptation, habits, and routines and behaviors.   IMPAIRMENTS: are limiting patient from ADLs, IADLs, and social participation.   CO-MORBIDITIES: has co-morbidities such as CAD, ESRD with dialysis, deafness in right ear, diabetic retinopathy, HTN, neuropathy  that affects occupational performance. Patient will benefit from skilled OT to address above impairments and improve overall function.  MODIFICATION OR ASSISTANCE TO COMPLETE EVALUATION: Min-Moderate modification of tasks or assist with assess necessary to complete an evaluation.  OT OCCUPATIONAL PROFILE AND HISTORY: Comprehensive assessment: Review of records and extensive additional review of physical, cognitive, psychosocial history related to current functional performance.  CLINICAL DECISION MAKING: Moderate - several treatment options, min-mod task modification necessary  REHAB POTENTIAL: Good  EVALUATION COMPLEXITY: Moderate    PLAN:  OT FREQUENCY: 2x/week  OT DURATION: 12 weeks  PLANNED INTERVENTIONS: self care/ADL training, therapeutic exercise, therapeutic activity, neuromuscular re-education, manual therapy, balance training, paraffin, moist heat, contrast bath, patient/family education, and DME and/or AE instructions  RECOMMENDED OTHER SERVICES: Pt is being seen by PT currently.   CONSULTED AND AGREED WITH PLAN OF CARE: Patient  PLAN FOR NEXT SESSION: Initiate HEP for grip, pinch and coordination skills.   Danelle Earthly, MS, OTR/L   Otis Dials, OT 12/09/2022, 11:27 AM

## 2022-12-10 ENCOUNTER — Encounter: Payer: Self-pay | Admitting: Podiatry

## 2022-12-11 ENCOUNTER — Ambulatory Visit: Payer: Medicare Other | Admitting: Occupational Therapy

## 2022-12-11 ENCOUNTER — Ambulatory Visit: Payer: Medicare Other | Admitting: Physical Therapy

## 2022-12-11 DIAGNOSIS — R269 Unspecified abnormalities of gait and mobility: Secondary | ICD-10-CM

## 2022-12-11 DIAGNOSIS — R278 Other lack of coordination: Secondary | ICD-10-CM

## 2022-12-11 DIAGNOSIS — M6281 Muscle weakness (generalized): Secondary | ICD-10-CM

## 2022-12-11 DIAGNOSIS — R2689 Other abnormalities of gait and mobility: Secondary | ICD-10-CM

## 2022-12-11 DIAGNOSIS — R262 Difficulty in walking, not elsewhere classified: Secondary | ICD-10-CM

## 2022-12-11 DIAGNOSIS — R2681 Unsteadiness on feet: Secondary | ICD-10-CM

## 2022-12-11 NOTE — Therapy (Signed)
OUTPATIENT PHYSICAL THERAPY NEURO TREATMENT     Patient Name: Gregory Crane MRN: [id number] DOB:May 25, 1971, 52 y.o., male Today's Date: 12/11/2022  PCP: Dale Wharton MD REFERRING PROVIDER: Dale Calamus MD  PT End of Session - 12/11/22 1015     Visit Number 43    Number of Visits 60    Date for PT Re-Evaluation 02/03/23    Authorization Type Medicare    Authorization Time Period 11/11/22-02/03/23    Progress Note Due on Visit 50    PT Start Time 1016    PT Stop Time 1100    PT Time Calculation (min) 44 min    Equipment Utilized During Treatment Gait belt    Activity Tolerance Patient tolerated treatment well;No increased pain;Patient limited by fatigue    Behavior During Therapy [facility] for tasks assessed/performed                    Past Medical History:  Diagnosis Date   Allergy    Anemia    Bell's palsy    Diabetes mellitus without complication    diet controlled   Hypertension    Hypothyroidism    Kidney stones    Pseudotumor cerebri    Stroke    Past Surgical History:  Procedure Laterality Date   COLONOSCOPY WITH PROPOFOL N/A 03/30/2020   Procedure: COLONOSCOPY WITH PROPOFOL;  Surgeon: Regis Bill, MD;  Location: [facility] ENDOSCOPY;  Service: Endoscopy;  Laterality: N/A;   LOOP RECORDER INSERTION N/A 01/27/2018   Procedure: LOOP RECORDER INSERTION;  Surgeon: Duke Salvia, MD;  Location: [facility] INVASIVE CV LAB;  Service: Cardiovascular;  Laterality: N/A;   LUMBAR PUNCTURE     as child   NO PAST SURGERIES     TEE WITHOUT CARDIOVERSION N/A 01/07/2018   Procedure: TRANSESOPHAGEAL ECHOCARDIOGRAM (TEE);  Surgeon: Antonieta Iba, MD;  Location: [facility] ORS;  Service: Cardiovascular;  Laterality: N/A;   Patient Active Problem List   Diagnosis Date Noted   Unsteady gait 10/05/2022   Hydronephrosis, left 05/04/2022   Type II diabetes mellitus with renal manifestations 05/04/2022   Leukocytosis 05/04/2022   Pre-op evaluation 04/24/2022   Headache  07/07/2021   Hearing loss 07/07/2021   Open wound 01/25/2021   History of colon polyps 10/15/2020   Postoperative hemorrhage involving digestive system following digestive system procedure 09/19/2020   Acute cholecystitis without calculus 09/09/2020   Type 2 diabetes mellitus, with long-term current use of insulin 09/09/2020   Cryptogenic stroke 07/13/2020   History of loop recorder 07/13/2020   Weakness 06/18/2020   History of 2019 novel coronavirus disease (COVID-19) 05/20/2020   ESRD (end stage renal disease) 04/04/2020   Pneumonia due to COVID-19 virus 04/03/2020   AKI (acute kidney injury) 04/03/2020   Elevated troponin 04/03/2020   Acquired trigger finger 06/08/2019   Lymphedema 06/08/2019   Anemia 04/17/2019   Swelling of left lower extremity 01/10/2019   Facial droop 04/09/2018   Daytime somnolence 03/30/2018   Carotid artery disease 02/03/2018   Intracranial vascular stenosis 09/12/2017   Cough 01/20/2017   Bell's palsy 11/10/2016   History of CVA (cerebrovascular accident) 11/10/2016   Benign localized hyperplasia of prostate with urinary obstruction 10/27/2016   History of nephrolithiasis 10/27/2016   TIA (transient ischemic attack) 10/20/2016   Near syncope 06/23/2016   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   Neuropathy 08/06/2015   Health care maintenance 08/06/2015   Essential hypertension 08/06/2015   Heme positive stool 10/10/2013   Hypothyroidism 10/10/2013   Microalbuminuria 10/10/2013  Hyperlipidemia 10/10/2013   B12 deficiency 10/10/2013   Diabetes 07/04/2013   Environmental allergies 07/04/2013   ONSET DATE: 2-3 years  REFERRING DIAG: Neuropathy  THERAPY DIAG:  Muscle weakness (generalized)  Other lack of coordination  Unsteadiness on feet  Difficulty in walking, not elsewhere classified  Other abnormalities of gait and mobility  Abnormality of gait and mobility  Rationale for Evaluation and Treatment Rehabilitation  SUBJECTIVE:                                                                                                                                                                                              SUBJECTIVE STATEMENT: Pt reports that he is doing well. Mild soreness in lower back and and BLE today 3-4/10. Plans to host friend from out of town for the next couple of weeks.    Pt accompanied by: self  PERTINENT HISTORY: Patient presents to physical therapy for neuropathy. He underwent banding of his L UE fistula on 05/13/22 and scheduled for additional banding on 06/07/22. 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 Patient reports his balance is very unsteady due to neuropathy in feet, feels weak in LE's.   PAIN:  No pain today, just soreness in legs  PRECAUTIONS: Fall  WEIGHT BEARING RESTRICTIONS Yes no lifting >5 lb in arm   FALLS: Has patient fallen in last 6 months? No  PLOF: Independent  PATIENT GOALS to be more steady and walk as normally as possible.   OBJECTIVE:   TODAY'S TREATMENT:  -AA/ROM on Nustep level 2, seat 9, Right arms 9, left arm 9, x Cues for neutral hip ER/IR on the RLE,   Seated therex:  Isometric hip adduction x 15 Seated heel raise x 15 Bil Sit<>stand with ball sqeeze x 10  Ankle PF GTB x 20 Ankle Eversion GTB x 10  Ankle inversion GTB x 10   Dynamic balance training on obstacle course.  1 foot in each space of agility ladder, weave through 8 cones, step up/down 4" agility step, ascend/descend step with BUE support, up/down airex pad. Performed 2 x 2 bouts. With CGA-min assist for safety with one near lateral LOB to the R  Gait training with 3# ankle weights x 332ft cues for narrow BOS for improved balance challenge. Mild stumble twice in 373ft with GCA for safety, but able to correction  without additional support from PT.   PATIENT EDUCATION: Education details: HEP, goals, POC. Pt educated throughout session about proper posture and  technique with exercises. Improved exercise technique, movement at target joints, use of target muscles after min to mod verbal, visual, tactile cues.  Person educated: Patient Education method: Explanation, Demonstration, Tactile cues, Verbal cues, and Handouts Education comprehension: verbalized understanding, returned demonstration, verbal cues required, and tactile cues required  HOME EXERCISE PROGRAM: Access Code: [email or web address] URL: [email or web address] Date: 06/03/2022 Prepared by: Precious Bard  Exercises - Seated Heel Toe Raises  - 1 x daily - 7 x weekly - 2 sets - 10 reps - 5 hold - Standing Tandem Balance with Counter Support  - 1 x daily - 7 x weekly - 2 sets - 2 reps - 30 hold - Standing March with Counter Support  - 1 x daily - 7 x weekly - 2 sets - 10 reps - 5 hold   GOALS: Goals reviewed with patient? Yes   SHORT TERM GOALS: Target date: 07/01/2022  Patient will be independent in home exercise program to improve strength/mobility for better functional independence with ADLs. Baseline:10/9; HEP given 2/21: HEP compliant  Goal status: MET   LONG TERM GOALS: Target date: 02/03/2023  Patient will increase FOTO score to equal to or greater than   63%  to demonstrate statistically significant improvement in mobility and quality of life.  Baseline: 10/9: 53% 11/29: 58% 12/27: 51% 1/17: 56% 2/21: 53% 3/18: 60%  4/8 58% Goal status: IN PROGRESS  2.  Patient (< 97 years old) will complete five times sit to stand test in < 10 seconds without UE support indicating an increased LE strength and improved balance. Baseline: 10/9: 19.6 seconds with walking stick; one LOB 11/29: 20.56 seconds 12/27: 13.4 seconds hands on knees 1/17: 13 seconds hands on knees  2/21: 14 seconds no hands 3/18: 12 seconds no hands. 4/8 11.3 sec UE  on knees.  Goal status: IN PROGRESS  3.  Patient will increase Berg Balance score by > 45/56 to demonstrate decreased fall risk during functional activities. Baseline: 10/9: 33/56 11/29: 16/10 96/04: 54/09 8/11:  91/47 2/21: 46/56 3/18: 48/56  Goal status: MET  +4.  Patient will increase 10 meter walk test to >1.78m/s as to improve gait speed for better community ambulation and to reduce fall risk. Baseline: 11/29: 1.19 m/s Goal status: MET  5.  Patient will increase ABC scale score >80% to demonstrate better functional mobility and better confidence with ADLs.  Baseline: 10/9: 67% 11/29: 75.6% 12/27: 78%  2/21: 78% 3/18: 80% Goal status: MET  6. Patient will tolerate 5 seconds of single leg stance without loss of balance to improve ability to get in and out of shower safely. Baseline: 11/29: unable to stand on single leg safely 12/27: 2 seconds 1/17: 3 seconds 2/21: 3 seconds 3/18: 4 seconds. 4/8: 3 sec   Goal status: IN PROGRESS   6. Patient will increase six minute walk test distance to >1000 for progression to community ambulator and improve gait ability Baseline: 12/27: 845 ft with with walking stick 1/17: 769ft 2/21: 930 ft  3/18: 950 ft 4/8: 1173ft Goal status: met  7. Patient will increase dynamic gait index score to >19/24 as to demonstrate reduced fall risk and improved dynamic gait balance for better safety with community/home ambulation.  Baseline: 3/18:  9/24. 4/8: 16/24 Goal status: in Progress   ASSESSMENT:  CLINICAL IMPRESSION: Pt put forth good effort throughout PT treatment for BLE strengthening and dynamic balance control. Mild LOB with narrower BOS and with high level balance tasks through obstacle course. Pt able to  correct on this day without additional support. Pt will continue to benefit from skilled PT to make progress in BLE strength, balance, and coordination to return to PLOF and improve QoL.   OBJECTIVE IMPAIRMENTS Abnormal gait, decreased activity  tolerance, decreased balance, decreased coordination, decreased endurance, decreased mobility, difficulty walking, decreased strength, impaired flexibility, impaired sensation, improper body mechanics, and pain.   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   REHAB POTENTIAL: Good  CLINICAL DECISION MAKING: Evolving/moderate complexity  EVALUATION COMPLEXITY: Moderate  PLAN: PT FREQUENCY: 2x/week  PT DURATION: 12 weeks  PLANNED INTERVENTIONS: Therapeutic exercises, Therapeutic activity, Neuromuscular re-education, Balance training, Gait training, Patient/Family education, Self Care, Joint mobilization, Stair training, Vestibular training, Canalith repositioning, Visual/preceptual remediation/compensation, DME instructions, Dry Needling, Cognitive remediation, Spinal mobilization, Cryotherapy, Moist heat, Manual lymph drainage, Compression bandaging, Taping, Vasopneumatic device, Ultrasound, Manual therapy, and Re-evaluation  PLAN FOR NEXT SESSION:   BLE strength, continue to progress dynamic balance and dual tasking balance. Continue to monitor BP as appropriate   Grier Rocher PT, DPT  Physical Therapist - [name]  12:06  PM 12/11/22

## 2022-12-14 ENCOUNTER — Encounter: Payer: Self-pay | Admitting: Occupational Therapy

## 2022-12-14 NOTE — Therapy (Signed)
OUTPATIENT OCCUPATIONAL THERAPY NEURO TREATMENT/PROGRESS UPDATE REPORTING PERIOD FROM TO 12/12/2022  Patient Name: Gregory Crane MRN: [id number] DOB:[birth year], 52 y.o., male  PCP: [place] REFERRING PROVIDER: Dale [place]   END OF SESSION:  OT End of Session - 12/14/22 2150     Visit Number 10    Number of Visits 24    Date for OT Re-Evaluation 01/13/23    OT Start Time 1100    OT Stop Time 1145    OT Time Calculation (min) 45 min    Activity Tolerance Patient tolerated treatment well    Behavior During Therapy [facility] for tasks assessed/performed              Past Medical History:  Diagnosis Date   Allergy    Anemia    Bell's palsy    Diabetes mellitus without complication    diet controlled   Hypertension    Hypothyroidism    Kidney stones    Pseudotumor cerebri    Stroke    Past Surgical History:  Procedure Laterality Date   COLONOSCOPY WITH PROPOFOL N/A 03/30/2020   Procedure: COLONOSCOPY WITH PROPOFOL;  Surgeon: Regis Bill, MD;  Location: [facility] ENDOSCOPY;  Service: Endoscopy;  Laterality: N/A;   LOOP RECORDER INSERTION N/A 01/27/2018   Procedure: LOOP RECORDER INSERTION;  Surgeon: Duke Salvia, MD;  Location: [facility] INVASIVE CV LAB;  Service: Cardiovascular;  Laterality: N/A;   LUMBAR PUNCTURE     as child   NO PAST SURGERIES     TEE WITHOUT CARDIOVERSION N/A 01/07/2018   Procedure: TRANSESOPHAGEAL ECHOCARDIOGRAM (TEE);  Surgeon: Antonieta Iba, MD;  Location: [facility] ORS;  Service: Cardiovascular;  Laterality: N/A;   Patient Active Problem List   Diagnosis Date Noted   Unsteady gait 10/05/2022   Hydronephrosis, left 05/04/2022   Type II diabetes mellitus with renal manifestations 05/04/2022   Leukocytosis 05/04/2022   Pre-op evaluation 04/24/2022   Headache 07/07/2021   Hearing loss 07/07/2021   Open wound 01/25/2021   History of colon polyps 10/15/2020   Postoperative hemorrhage involving digestive system following digestive system  procedure 09/19/2020   Acute cholecystitis without calculus 09/09/2020   Type 2 diabetes mellitus, with long-term current use of insulin 09/09/2020   Cryptogenic stroke 07/13/2020   History of loop recorder 07/13/2020   Weakness 06/18/2020   History of 2019 novel coronavirus disease (COVID-19) 05/20/2020   ESRD (end stage renal disease) 04/04/2020   Pneumonia due to COVID-19 virus 04/03/2020   AKI (acute kidney injury) 04/03/2020   Elevated troponin 04/03/2020   Acquired trigger finger 06/08/2019   Lymphedema 06/08/2019   Anemia 04/17/2019   Swelling of left lower extremity 01/10/2019   Facial droop 04/09/2018   Daytime somnolence 03/30/2018   Carotid artery disease 02/03/2018   Intracranial vascular stenosis 09/12/2017   Cough 01/20/2017   Bell's palsy 11/10/2016   History of CVA (cerebrovascular accident) 11/10/2016   Benign localized hyperplasia of prostate with urinary obstruction 10/27/2016   History of nephrolithiasis 10/27/2016   TIA (transient ischemic attack) 10/20/2016   Near syncope 06/23/2016   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   Neuropathy 08/06/2015   Health care maintenance 08/06/2015   Essential hypertension 08/06/2015   Heme positive stool 10/10/2013   Hypothyroidism 10/10/2013   Microalbuminuria 10/10/2013   Hyperlipidemia 10/10/2013   B12 deficiency 10/10/2013   Diabetes 07/04/2013   Environmental allergies 07/04/2013    ONSET DATE: May 2023  REFERRING DIAG: CVA  THERAPY DIAG:  Muscle weakness (generalized)  Other lack of  coordination  Unsteadiness on feet  Rationale for Evaluation and Treatment: Rehabilitation  SUBJECTIVE:  SUBJECTIVE STATEMENT: Pt reports he is doing well, has a friend coming to visit and stay with him for a while.  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    PRECAUTIONS: Fall and Other: No strengthening exercises greater than 5-10 pounds in left arm.   Dialysis Tues, Th, Sat.  WEIGHT BEARING RESTRICTIONS: No  PAIN:  Are you having pain? No  FALLS: Has patient fallen in last 6 months? No  LIVING ENVIRONMENT: Lives with: alone Lives in: House/apartment Stairs: Yes: Internal: 15 steps; on right going up Has following equipment at home: Single point cane and Walker - 2 wheeled  PLOF: Independent  PATIENT GOALS: Pt reports he wants to have better use of his left hand, be able to do buttons and shoes.   OBJECTIVE:   HAND DOMINANCE: Right  ADLs: Overall ADLs: modified independent with most tasks Transfers/ambulation related to ADLs: Now walking without an assistive device, used cane in the past.  Eating: Independent with self feeding, difficulty with cutting food, requires increased focus and effort. Grooming: Independent UB Dressing: Difficulty with buttons, uses pullovers most of the time  LB Dressing: Difficulty with tying shoes Toileting: Independent Bathing: Independent Tub Shower transfers: Independent Equipment: none  IADLs: Shopping: independent,  uses a cart to push in the store Light housekeeping: modified independent Meal Prep: difficulty with tasks requiring use of left hand, bilateral hand use.   Community mobility: Ambulates without use of assistive device, able to drive Medication management: Independent Financial management: Independent Handwriting: 90% legible  MOBILITY STATUS: Needs Assist: Pt has progressed with PT, used to use a cane but ambulating without an assistive device but still has balance difficulties.    POSTURE COMMENTS:    Sitting balance: Sits without UE support up to 30  sec  ACTIVITY TOLERANCE: Activity tolerance: limited activity tolerance  FUNCTIONAL OUTCOME MEASURES: FOTO: 53  UPPER EXTREMITY ROM:    Active ROM Right eval Left eval  Shoulder flexion [facility]  Shoulder abduction [facility]  Shoulder adduction [facility]  Shoulder extension [facility]  Shoulder internal rotation [facility]  Shoulder external rotation [facility]  Elbow flexion [facility] WFL  Elbow extension [facility]  Wrist flexion [facility] WFL  Wrist extension [facility] WFL  Wrist ulnar deviation [facility] WFL  Wrist radial deviation WFL WFL  Wrist pronation WFL WFL  Wrist supination WFL WFL  (Blank rows = not tested)  UPPER EXTREMITY MMT:     MMT Right eval Left eval  Shoulder flexion 5/5 3+/5  Shoulder abduction 5/5 3+/5  Shoulder adduction    Shoulder extension    Shoulder internal rotation    Shoulder external rotation    Middle trapezius    Lower trapezius    Elbow flexion 5/5 3+/5  Elbow extension 5/5 3+/5  Wrist flexion 5/5 3+/5  Wrist extension 5/5 3+/5  Wrist ulnar deviation 5/5 3+/5  Wrist radial deviation 5/5 3+/5  Wrist pronation 5/5 3+/5  Wrist supination 5/5 3+/5  (Blank rows = not tested)  Opposition of thumb to all digits on  right, on left hand opposition to ring finger but cannot demonstrate to small finger.   HAND FUNCTION: Grip strength: Right: 44 lbs; Left: 35 lbs, Lateral pinch: Right: 17 lbs, Left: 16 lbs, 3 point pinch: Right: 14 lbs, Left: 0 lbs, and Tip pinch: Right 2 lbs, Left: 0 lbs  COORDINATION: 9 Hole Peg test: Right: 36 sec; Left: 56 sec  TODAY'S TREATMENT:                                                                                                                              Therapeutic Exercises:   Therapeutic Activity: Patient seen for focus on fine motor coordination and manipulation skills with use of small half inch square scrabble like tiles with turning and flipping from a flat table top surface. Patient utilizing isolated finger movements with  index and middle fingers to move pieces to formulate words. After completion, patient picking up squares one by one and utilizing translatory skills of the hand to move to the palm and using the hand for storage. Patient requires occasional cues for prehension patterns as well as isolated finger movements.     PATIENT EDUCATION: Education details: fine motor coordination, manipulation skills, L hand strengthening Person educated: Patient Education method: Explanation Education comprehension: verbalized understanding  HOME EXERCISE PROGRAM: Will provide HEP in 1-2 visits  GOALS: Goals reviewed with patient? Yes  SHORT TERM GOALS: Target date:  11/18/2022   Pt will demonstrate ability to hold and utilize utensils for cutting meat with modified independence. Baseline: difficulty with cutting, stabilizing item with fork for cutting. Goal status: IN PROGRESS  2.  Pt will demonstrate ability to obtain keys from left pocket without difficult and without dropping.   Baseline: pt struggles with reaching into left pocket to get keys.   Goal status: IN PROGRESS   LONG TERM GOALS: Target date: 01/13/2023   Pt will demonstrate HEP with modified independence.   Baseline: no current program for left UE.  Goal status: IN PROGRESS  2.  Pt will improve left UE coordination by 10 secs on 9 hole peg test to demonstrate shoe tying with modified independence.  Baseline: currently wearing slip on shoes and unable to tie shoes, left 9 hole peg test in 56 sec at eval  Goal status: IN PROGRESS  3.  Pt will demonstrate improved coordination in left hand to demonstrate buttoning buttons on clothing with modified independence.  Baseline: difficulty with buttons and has been wearing more pullovers to avoid challenge of buttons. Goal status: IN PROGRESS  4.  Pt will demonstrate improvement in left grip strength by 5# to open jars and containers with modified independence and greater ease.   Baseline:  Difficulty with opening jars and containers with use of left hand at eval.  Goal status: IN PROGRESS  5.  Pt will improve left UE strength by 1 mm grade to hold and hand wash dishes with modified independence.  Baseline: difficulty with being  able to hand wash dishes at eval, difficulty stabilizing dish. Goal status: IN PROGRESS  6.  Pt will demonstrate FOTO score of 59 or greater to show a clinically relevant change in LUE to impact greater independence with daily ADL and IADL tasks at home and in the community.   Baseline:  Goal status: IN PROGRESS  ASSESSMENT: CLINICAL IMPRESSION: Pt has continued to progress in all areas, his strength and coordination improving to perform ADL and IADL tasks.  His functional grip has improved with opening jars and containers but can vary based on his fatigue levels and effects from dialysis.  Pt demonstrating improvements in picking up small objects and manipulating items but still reports difficulty at times.  He responds well to cues and repetition of tasks.  Pt continues to benefit from skilled OT services to maximize safety and independence in necessary daily tasks.    PERFORMANCE DEFICITS: in functional skills including ADLs, IADLs, coordination, dexterity, sensation, tone, ROM, strength, flexibility, Fine motor control, balance, endurance, decreased knowledge of use of DME, and UE functional use, cognitive skills including, and psychosocial skills including environmental adaptation, habits, and routines and behaviors.   IMPAIRMENTS: are limiting patient from ADLs, IADLs, and social participation.   CO-MORBIDITIES: has co-morbidities such as CAD, ESRD with dialysis, deafness in right ear, diabetic retinopathy, HTN, neuropathy  that affects occupational performance. Patient will benefit from skilled OT to address above impairments and improve overall function.  MODIFICATION OR ASSISTANCE TO COMPLETE EVALUATION: Min-Moderate modification of tasks or  assist with assess necessary to complete an evaluation.  OT OCCUPATIONAL PROFILE AND HISTORY: Comprehensive assessment: Review of records and extensive additional review of physical, cognitive, psychosocial history related to current functional performance.  CLINICAL DECISION MAKING: Moderate - several treatment options, min-mod task modification necessary  REHAB POTENTIAL: Good  EVALUATION COMPLEXITY: Moderate    PLAN:  OT FREQUENCY: 2x/week  OT DURATION: 12 weeks  PLANNED INTERVENTIONS: self care/ADL training, therapeutic exercise, therapeutic activity, neuromuscular re-education, manual therapy, balance training, paraffin, moist heat, contrast bath, patient/family education, and DME and/or AE instructions  RECOMMENDED OTHER SERVICES: Pt is being seen by PT currently.   CONSULTED AND AGREED WITH PLAN OF CARE: Patient  PLAN FOR NEXT SESSION: Initiate HEP for grip, pinch and coordination skills.   Nelly Scriven Cornelius Moras, OTR/L, CLT  Dailen Mcclish, OT 12/14/2022, 9:51 PM

## 2022-12-16 ENCOUNTER — Encounter: Payer: Self-pay | Admitting: Physical Therapy

## 2022-12-16 ENCOUNTER — Ambulatory Visit: Payer: Medicare Other | Admitting: Occupational Therapy

## 2022-12-16 ENCOUNTER — Ambulatory Visit: Payer: Medicare Other | Admitting: Physical Therapy

## 2022-12-16 ENCOUNTER — Ambulatory Visit: Payer: Medicare Other | Admitting: Podiatry

## 2022-12-16 DIAGNOSIS — R2681 Unsteadiness on feet: Secondary | ICD-10-CM

## 2022-12-16 DIAGNOSIS — M6281 Muscle weakness (generalized): Secondary | ICD-10-CM | POA: Diagnosis not present

## 2022-12-16 DIAGNOSIS — R269 Unspecified abnormalities of gait and mobility: Secondary | ICD-10-CM

## 2022-12-16 DIAGNOSIS — R262 Difficulty in walking, not elsewhere classified: Secondary | ICD-10-CM

## 2022-12-16 DIAGNOSIS — R2689 Other abnormalities of gait and mobility: Secondary | ICD-10-CM

## 2022-12-16 NOTE — Therapy (Signed)
OCCUPATIONAL THERAPY NEURO TREATMENT NOTE  Patient Name: Gregory Crane MRN: [id number] DOB:04-05-1971, 52 y.o., male  PCP: Dale Millington REFERRING PROVIDER: Dale [place]   END OF SESSION:  OT End of Session - 12/16/22 1153     Visit Number 11    Number of Visits 24    Date for OT Re-Evaluation 01/13/23    OT Start Time 1150    OT Stop Time 1230    OT Time Calculation (min) 40 min    Activity Tolerance Patient tolerated treatment well    Behavior During Therapy WFL for tasks assessed/performed              Past Medical History:  Diagnosis Date   Allergy    Anemia    Bell's palsy    Diabetes mellitus without complication    diet controlled   Hypertension    Hypothyroidism    Kidney stones    Pseudotumor cerebri    Stroke    Past Surgical History:  Procedure Laterality Date   COLONOSCOPY WITH PROPOFOL N/A 03/30/2020   Procedure: COLONOSCOPY WITH PROPOFOL;  Surgeon: Regis Bill, MD;  Location: [facility] ENDOSCOPY;  Service: Endoscopy;  Laterality: N/A;   LOOP RECORDER INSERTION N/A 01/27/2018   Procedure: LOOP RECORDER INSERTION;  Surgeon: Duke Salvia, MD;  Location: [facility] INVASIVE CV LAB;  Service: Cardiovascular;  Laterality: N/A;   LUMBAR PUNCTURE     as child   NO PAST SURGERIES     TEE WITHOUT CARDIOVERSION N/A 01/07/2018   Procedure: TRANSESOPHAGEAL ECHOCARDIOGRAM (TEE);  Surgeon: Antonieta Iba, MD;  Location: [facility] ORS;  Service: Cardiovascular;  Laterality: N/A;   Patient Active Problem List   Diagnosis Date Noted   Unsteady gait 10/05/2022   Hydronephrosis, left 05/04/2022   Type II diabetes mellitus with renal manifestations 05/04/2022   Leukocytosis 05/04/2022   Pre-op evaluation 04/24/2022   Headache 07/07/2021   Hearing loss 07/07/2021   Open wound 01/25/2021   History of colon polyps 10/15/2020   Postoperative hemorrhage involving digestive system following digestive system procedure 09/19/2020   Acute cholecystitis without calculus  09/09/2020   Type 2 diabetes mellitus, with long-term current use of insulin 09/09/2020   Cryptogenic stroke 07/13/2020   History of loop recorder 07/13/2020   Weakness 06/18/2020   History of 2019 novel coronavirus disease (COVID-19) 05/20/2020   ESRD (end stage renal disease) 04/04/2020   Pneumonia due to COVID-19 virus 04/03/2020   AKI (acute kidney injury) 04/03/2020   Elevated troponin 04/03/2020   Acquired trigger finger 06/08/2019   Lymphedema 06/08/2019   Anemia 04/17/2019   Swelling of left lower extremity 01/10/2019   Facial droop 04/09/2018   Daytime somnolence 03/30/2018   Carotid artery disease 02/03/2018   Intracranial vascular stenosis 09/12/2017   Cough 01/20/2017   Bell's palsy 11/10/2016   History of CVA (cerebrovascular accident) 11/10/2016   Benign localized hyperplasia of prostate with urinary obstruction 10/27/2016   History of nephrolithiasis 10/27/2016   TIA (transient ischemic attack) 10/20/2016   Near syncope 06/23/2016   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   Neuropathy 08/06/2015   Health care maintenance 08/06/2015   Essential hypertension 08/06/2015   Heme positive stool 10/10/2013   Hypothyroidism 10/10/2013   Microalbuminuria 10/10/2013   Hyperlipidemia 10/10/2013   B12 deficiency 10/10/2013   Diabetes 07/04/2013   Environmental allergies 07/04/2013    ONSET DATE: May 2023  REFERRING DIAG: CVA  THERAPY DIAG:  Muscle weakness (generalized)  Rationale for Evaluation and Treatment: Rehabilitation  SUBJECTIVE:  SUBJECTIVE STATEMENT:  Pt. Reports that his friend is still visiting from [facility].  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    PRECAUTIONS: Fall and Other: No strengthening exercises greater than 5-10 pounds in left arm.   Dialysis Tues, Th, Sat.  WEIGHT BEARING RESTRICTIONS: No  PAIN:  Are you having pain? No  FALLS: Has patient fallen in last 6 months? No  LIVING ENVIRONMENT: Lives with: alone Lives in: House/apartment Stairs: Yes: Internal: 15 steps; on right going up Has following equipment at home: Single point cane and Walker - 2 wheeled  PLOF: Independent  PATIENT GOALS: Pt reports he wants to have better use of his left hand, be able to do buttons and shoes.   OBJECTIVE:   HAND DOMINANCE: Right  ADLs: Overall ADLs: modified independent with most tasks Transfers/ambulation related to ADLs: Now walking without an assistive device, used cane in the past.  Eating: Independent with self feeding, difficulty with cutting food, requires increased focus and effort. Grooming: Independent UB Dressing: Difficulty with buttons, uses pullovers most of the time  LB Dressing: Difficulty with tying shoes Toileting: Independent Bathing: Independent Tub Shower transfers: Independent Equipment: none  IADLs: Shopping: independent,  uses a cart to push in the store Light housekeeping: modified independent Meal Prep: difficulty with tasks requiring use of left hand, bilateral hand use.   Community mobility: Ambulates without use of assistive device, able to drive Medication management: Independent Financial management: Independent Handwriting: 90% legible  MOBILITY STATUS: Needs Assist: Pt has progressed with PT, used to use a cane but ambulating without an assistive device but still has balance difficulties.    POSTURE COMMENTS:    Sitting balance: Sits without UE support up to 30 sec  ACTIVITY TOLERANCE: Activity tolerance: limited activity tolerance  FUNCTIONAL OUTCOME MEASURES: FOTO: 53  UPPER  EXTREMITY ROM:    Active ROM Right eval Left eval  Shoulder flexion [facility]  Shoulder abduction [facility]  Shoulder adduction [facility]  Shoulder extension [facility]  Shoulder internal rotation [facility]  Shoulder external rotation [facility]  Elbow flexion [facility] WFL  Elbow extension [facility]  Wrist flexion [facility] WFL  Wrist extension [facility] WFL  Wrist ulnar deviation WFL WFL  Wrist radial deviation WFL WFL  Wrist pronation WFL WFL  Wrist supination WFL WFL  (Blank rows = not tested)  UPPER EXTREMITY MMT:     MMT Right eval Left eval  Shoulder flexion 5/5 3+/5  Shoulder abduction 5/5 3+/5  Shoulder adduction    Shoulder extension    Shoulder internal rotation    Shoulder external rotation    Middle trapezius    Lower trapezius    Elbow flexion 5/5 3+/5  Elbow extension 5/5 3+/5  Wrist flexion 5/5 3+/5  Wrist extension 5/5 3+/5  Wrist ulnar deviation 5/5 3+/5  Wrist radial deviation 5/5 3+/5  Wrist pronation 5/5 3+/5  Wrist supination 5/5 3+/5  (Blank rows = not tested)  Opposition of thumb to all digits on right, on left hand opposition to ring finger but cannot demonstrate to small finger.   HAND FUNCTION: Grip strength: Right: 44  lbs; Left: 35 lbs, Lateral pinch: Right: 17 lbs, Left: 16 lbs, 3 point pinch: Right: 14 lbs, Left: 0 lbs, and Tip pinch: Right 2 lbs, Left: 0 lbs  COORDINATION: 9 Hole Peg test: Right: 36 sec; Left: 56 sec  TODAY'S TREATMENT:  Neuromuscular re-education:  Pt. worked on left hand [facility] skills grasping 1" sticks, 1/4" collars, and 1/4" washers, and placing them onto the pegboard. Pt. worked on removing the pegs using bilateral alternating hand patterns. Pt. worked on translatory movements with the left hand moving 4-1/2" circular  spheres through his hand.                                                                                                                                Therapeutic Exercises:  Pt. performed left gross gripping with a gross  grip strengthener. Pt. worked on sustaining grip while grasping pegs and reaching at various heights. The Gripper was set to 23.4 # of grip strength resistance x's 3 trials.    PATIENT EDUCATION: Education details: fine motor coordination, manipulation skills, L hand strengthening Person educated: Patient Education method: Explanation Education comprehension: verbalized understanding  HOME EXERCISE PROGRAM: Will provide HEP in 1-2 visits  GOALS: Goals reviewed with patient? Yes  SHORT TERM GOALS: Target date:  11/18/2022   Pt will demonstrate ability to hold and utilize utensils for cutting meat with modified independence. Baseline: difficulty with cutting, stabilizing item with fork for cutting. Goal status: IN PROGRESS  2.  Pt will demonstrate ability to obtain keys from left pocket without difficult and without dropping.   Baseline: pt struggles with reaching into left pocket to get keys.   Goal status: IN PROGRESS   LONG TERM GOALS: Target date: 01/13/2023   Pt will demonstrate HEP with modified independence.   Baseline: no current program for left UE.  Goal status: IN PROGRESS  2.  Pt will improve left UE coordination by 10 secs on 9 hole peg test to demonstrate shoe tying with modified independence.  Baseline: currently wearing slip on shoes and unable to tie shoes, left 9 hole peg test in 56 sec at eval  Goal status: IN PROGRESS  3.  Pt will demonstrate improved coordination in left hand to demonstrate buttoning buttons on clothing with modified independence.  Baseline: difficulty with buttons and has been wearing more pullovers to avoid challenge of buttons. Goal status: IN PROGRESS  4.  Pt will demonstrate improvement in left grip strength by 5# to open jars and containers with modified independence and greater ease.   Baseline: Difficulty with opening jars and containers with use of left hand at eval.  Goal status: IN PROGRESS  5.  Pt will improve left UE  strength by 1 mm grade to hold and hand wash dishes with modified independence.  Baseline: difficulty with being able to hand wash dishes at eval, difficulty stabilizing dish. Goal status: IN PROGRESS  6.  Pt will demonstrate FOTO  score of 59 or greater to show a clinically relevant change in LUE to impact greater independence with daily ADL and IADL tasks at home and in the community.   Baseline:  Goal status: IN PROGRESS  ASSESSMENT: CLINICAL IMPRESSION:  Pt. reports that he  is trying to use his left hand more during daily tasks at home. Pt. was able to grasp and store pegs minimally dropping the sticks. Pt. presented with difficulty with attempting to perform translatory movements moving the 1" sticks, as well as 1/2" circular spheres within his left hand from his palm to the tip of the 2nd digit, and thumb. Pt. Presented with some difficulty grasping the 1/4" collars from the dish, however was able to efficiently remove the collars from the vertical sticks on the board. Pt. Was able to efficiently remove the vertical sticks from the pegboard using bilateral alternating hand patterns. Pt continues to benefit from skilled OT services to maximize safety and independence in necessary daily tasks.    PERFORMANCE DEFICITS: in functional skills including ADLs, IADLs, coordination, dexterity, sensation, tone, ROM, strength, flexibility, Fine motor control, balance, endurance, decreased knowledge of use of DME, and UE functional use, cognitive skills including, and psychosocial skills including environmental adaptation, habits, and routines and behaviors.   IMPAIRMENTS: are limiting patient from ADLs, IADLs, and social participation.   CO-MORBIDITIES: has co-morbidities such as CAD, ESRD with dialysis, deafness in right ear, diabetic retinopathy, HTN, neuropathy  that affects occupational performance. Patient will benefit from skilled OT to address above impairments and improve overall  function.  MODIFICATION OR ASSISTANCE TO COMPLETE EVALUATION: Min-Moderate modification of tasks or assist with assess necessary to complete an evaluation.  OT OCCUPATIONAL PROFILE AND HISTORY: Comprehensive assessment: Review of records and extensive additional review of physical, cognitive, psychosocial history related to current functional performance.  CLINICAL DECISION MAKING: Moderate - several treatment options, min-mod task modification necessary  REHAB POTENTIAL: Good  EVALUATION COMPLEXITY: Moderate    PLAN:  OT FREQUENCY: 2x/week  OT DURATION: 12 weeks  PLANNED INTERVENTIONS: self care/ADL training, therapeutic exercise, therapeutic activity, neuromuscular re-education, manual therapy, balance training, paraffin, moist heat, contrast bath, patient/family education, and DME and/or AE instructions  RECOMMENDED OTHER SERVICES: Pt is being seen by PT currently.   CONSULTED AND AGREED WITH PLAN OF CARE: Patient  PLAN FOR NEXT SESSION: Initiate HEP for grip, pinch and coordination skills.   Olegario Messier, MS, OTR/L

## 2022-12-16 NOTE — Therapy (Signed)
OUTPATIENT PHYSICAL THERAPY NEURO TREATMENT     Patient Name: Gregory Crane MRN: [id number] DOB:1971/03/23, 52 y.o., male Today's Date: 12/16/2022  PCP: Dale [place] MD REFERRING PROVIDER: Dale Acampo MD  PT End of Session - 12/16/22 1102     Visit Number 44    Number of Visits 60    Date for PT Re-Evaluation 02/03/23    Authorization Type Medicare    Authorization Time Period 11/11/22-02/03/23    Progress Note Due on Visit 50    PT Start Time 1110    PT Stop Time 1144    PT Time Calculation (min) 34 min    Equipment Utilized During Treatment Gait belt    Activity Tolerance Patient tolerated treatment well;No increased pain;Patient limited by fatigue    Behavior During Therapy [facility] for tasks assessed/performed                    Past Medical History:  Diagnosis Date   Allergy    Anemia    Bell's palsy    Diabetes mellitus without complication    diet controlled   Hypertension    Hypothyroidism    Kidney stones    Pseudotumor cerebri    Stroke    Past Surgical History:  Procedure Laterality Date   COLONOSCOPY WITH PROPOFOL N/A 03/30/2020   Procedure: COLONOSCOPY WITH PROPOFOL;  Surgeon: Regis Bill, MD;  Location: [facility] ENDOSCOPY;  Service: Endoscopy;  Laterality: N/A;   LOOP RECORDER INSERTION N/A 01/27/2018   Procedure: LOOP RECORDER INSERTION;  Surgeon: Duke Salvia, MD;  Location: [facility] INVASIVE CV LAB;  Service: Cardiovascular;  Laterality: N/A;   LUMBAR PUNCTURE     as child   NO PAST SURGERIES     TEE WITHOUT CARDIOVERSION N/A 01/07/2018   Procedure: TRANSESOPHAGEAL ECHOCARDIOGRAM (TEE);  Surgeon: Antonieta Iba, MD;  Location: [facility] ORS;  Service: Cardiovascular;  Laterality: N/A;   Patient Active Problem List   Diagnosis Date Noted   Unsteady gait 10/05/2022   Hydronephrosis, left 05/04/2022   Type II diabetes mellitus with renal manifestations 05/04/2022   Leukocytosis 05/04/2022   Pre-op evaluation 04/24/2022   Headache  07/07/2021   Hearing loss 07/07/2021   Open wound 01/25/2021   History of colon polyps 10/15/2020   Postoperative hemorrhage involving digestive system following digestive system procedure 09/19/2020   Acute cholecystitis without calculus 09/09/2020   Type 2 diabetes mellitus, with long-term current use of insulin 09/09/2020   Cryptogenic stroke 07/13/2020   History of loop recorder 07/13/2020   Weakness 06/18/2020   History of 2019 novel coronavirus disease (COVID-19) 05/20/2020   ESRD (end stage renal disease) 04/04/2020   Pneumonia due to COVID-19 virus 04/03/2020   AKI (acute kidney injury) 04/03/2020   Elevated troponin 04/03/2020   Acquired trigger finger 06/08/2019   Lymphedema 06/08/2019   Anemia 04/17/2019   Swelling of left lower extremity 01/10/2019   Facial droop 04/09/2018   Daytime somnolence 03/30/2018   Carotid artery disease 02/03/2018   Intracranial vascular stenosis 09/12/2017   Cough 01/20/2017   Bell's palsy 11/10/2016   History of CVA (cerebrovascular accident) 11/10/2016   Benign localized hyperplasia of prostate with urinary obstruction 10/27/2016   History of nephrolithiasis 10/27/2016   TIA (transient ischemic attack) 10/20/2016   Near syncope 06/23/2016   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   Neuropathy 08/06/2015   Health care maintenance 08/06/2015   Essential hypertension 08/06/2015   Heme positive stool 10/10/2013   Hypothyroidism 10/10/2013   Microalbuminuria 10/10/2013  Hyperlipidemia 10/10/2013   B12 deficiency 10/10/2013   Diabetes 07/04/2013   Environmental allergies 07/04/2013   ONSET DATE: 2-3 years  REFERRING DIAG: Neuropathy  THERAPY DIAG:  Unsteadiness on feet  Difficulty in walking, not elsewhere classified  Other abnormalities of gait and mobility  Abnormality of gait and mobility  Rationale for Evaluation and Treatment Rehabilitation  SUBJECTIVE:                                                                                                                                                                                              SUBJECTIVE STATEMENT: Pt reports that he is doing well. A little sore in the back today but doing well overall. 3/10 back soreness today.    Pt accompanied by: self  PERTINENT HISTORY: Patient presents to physical therapy for neuropathy. He underwent banding of his L UE fistula on 05/13/22 and scheduled for additional banding on 06/07/22. 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 Patient reports his balance is very unsteady due to neuropathy in feet, feels weak in LE's.   PAIN:  No pain today, just soreness in legs  PRECAUTIONS: Fall  WEIGHT BEARING RESTRICTIONS Yes no lifting >5 lb in arm   FALLS: Has patient fallen in last 6 months? No  PLOF: Independent  PATIENT GOALS to be more steady and walk as normally as possible.   OBJECTIVE:   TODAY'S TREATMENT:   TE  Nustep interval training level 1 x 1 min  Level 3 x 1 min  Level 1 x 1 min  Level 3 x 1 min  Level 1 x 1 min  Level 3 x 1 min  Level 1 x 1 min   Seated therex:  Isometric hip adduction x 15 x 3 sec holds x 2 sets  Seated heel raise x 15 Bil  NMR  Standing toe to heel rocks 2 x 10   Static balance with tandem on R and semitandem on the L   Gait training with 2.5# ankle weights x 450 cues for narrow BOS for improved balance challenge.   Pt arrived at wrong appointment day so session cut a little short secondary to chart review time.   PATIENT EDUCATION: Education details: HEP, goals, POC. Pt educated throughout session about proper posture and technique with exercises. Improved exercise technique, movement at target joints, use of target muscles after min to mod verbal, visual, tactile cues.  Person educated:  Patient Education method: Explanation, Demonstration, Tactile cues, Verbal cues, and  Handouts Education comprehension: verbalized understanding, returned demonstration, verbal cues required, and tactile cues required  HOME EXERCISE PROGRAM: Access Code: [email or web address] URL: [email or web address] Date: 06/03/2022 Prepared by: Precious Bard  Exercises - Seated Heel Toe Raises  - 1 x daily - 7 x weekly - 2 sets - 10 reps - 5 hold - Standing Tandem Balance with Counter Support  - 1 x daily - 7 x weekly - 2 sets - 2 reps - 30 hold - Standing March with Counter Support  - 1 x daily - 7 x weekly - 2 sets - 10 reps - 5 hold   GOALS: Goals reviewed with patient? Yes   SHORT TERM GOALS: Target date: 07/01/2022  Patient will be independent in home exercise program to improve strength/mobility for better functional independence with ADLs. Baseline:10/9; HEP given 2/21: HEP compliant  Goal status: MET   LONG TERM GOALS: Target date: 02/03/2023  Patient will increase FOTO score to equal to or greater than   63%  to demonstrate statistically significant improvement in mobility and quality of life.  Baseline: 10/9: 53% 11/29: 58% 12/27: 51% 1/17: 56% 2/21: 53% 3/18: 60%  4/8 58% Goal status: IN PROGRESS  2.  Patient (< 52 years old) will complete five times sit to stand test in < 10 seconds without UE support indicating an increased LE strength and improved balance. Baseline: 10/9: 19.6 seconds with walking stick; one LOB 11/29: 20.56 seconds 12/27: 13.4 seconds hands on knees 1/17: 13 seconds hands on knees  2/21: 14 seconds no hands 3/18: 12 seconds no hands. 4/8 11.3 sec UE on knees.  Goal status: IN PROGRESS  3.  Patient will increase Berg Balance score by > 45/56 to demonstrate decreased fall risk during functional activities. Baseline: 10/9: 33/56 11/29: 40/98 11/91: 47/82 9/56:  21/30 2/21: 46/56 3/18: 48/56  Goal status: MET  +4.  Patient will increase 10 meter walk test to  >1.51m/s as to improve gait speed for better community ambulation and to reduce fall risk. Baseline: 11/29: 1.19 m/s Goal status: MET  5.  Patient will increase ABC scale score >80% to demonstrate better functional mobility and better confidence with ADLs.  Baseline: 10/9: 67% 11/29: 75.6% 12/27: 78%  2/21: 78% 3/18: 80% Goal status: MET  6. Patient will tolerate 5 seconds of single leg stance without loss of balance to improve ability to get in and out of shower safely. Baseline: 11/29: unable to stand on single leg safely 12/27: 2 seconds 1/17: 3 seconds 2/21: 3 seconds 3/18: 4 seconds. 4/8: 3 sec   Goal status: IN PROGRESS   6. Patient will increase six minute walk test distance to >1000 for progression to community ambulator and improve gait ability Baseline: 12/27: 845 ft with with walking stick 1/17: 724ft 2/21: 930 ft  3/18: 950 ft 4/8: 1164ft Goal status: met  7. Patient will increase dynamic gait index score to >19/24 as to demonstrate reduced fall risk and improved dynamic gait balance for better safety with community/home ambulation.  Baseline: 3/18:  9/24. 4/8: 16/24 Goal status: in Progress   ASSESSMENT:  CLINICAL IMPRESSION:  Pt is pleasant and puts forth good effort throughout PT treatment for BLE strengthening and dynamic balance control. Challenged balance with NBOS balance with increased difficulty win the L > R. Pt will continue to benefit from skilled PT to make progress in BLE strength, balance, and coordination to return to PLOF and improve QoL.   OBJECTIVE IMPAIRMENTS Abnormal gait, decreased activity tolerance, decreased balance,  decreased coordination, decreased endurance, decreased mobility, difficulty walking, decreased strength, impaired flexibility, impaired sensation, improper body mechanics, and pain.   ACTIVITY LIMITATIONS carrying, lifting, bending, standing, squatting, sleeping, stairs, transfers, bed mobility, bathing, toileting, dressing, locomotion  level, and caring for others  PARTICIPATION LIMITATIONS: meal prep, cleaning, laundry, medication management, personal finances, interpersonal relationship, driving, shopping, community activity, and yard work  PERSONAL FACTORS Age, Fitness, Past/current experiences, Time since onset of injury/illness/exacerbation, Transportation, and 3+ comorbidities: CAD, cryptogenic stroke (2021), deafness in R ear, diabetic retinopathy, dialysis, ESRD, COVID, HTN, hypothyroidism, leukocytosis, nephrolithiasis, neuropathy, Pseudotumor cerebri 1986, sepsis, DM type I, DM type II  are also affecting patient's functional outcome.   REHAB POTENTIAL: Good  CLINICAL DECISION MAKING: Evolving/moderate complexity  EVALUATION COMPLEXITY: Moderate  PLAN: PT FREQUENCY: 2x/week  PT DURATION: 12 weeks  PLANNED INTERVENTIONS: Therapeutic exercises, Therapeutic activity, Neuromuscular re-education, Balance training, Gait training, Patient/Family education, Self Care, Joint mobilization, Stair training, Vestibular training, Canalith repositioning, Visual/preceptual remediation/compensation, DME instructions, Dry Needling, Cognitive remediation, Spinal mobilization, Cryotherapy, Moist heat, Manual lymph drainage, Compression bandaging, Taping, Vasopneumatic device, Ultrasound, Manual therapy, and Re-evaluation  PLAN FOR NEXT SESSION:   BLE strength, continue to progress dynamic balance and dual tasking balance. Continue to monitor BP as appropriate  Norman Herrlich PT ,DPT Physical Therapist- [place]  [facility]   1:20 PM 12/16/22

## 2022-12-17 NOTE — Therapy (Signed)
OUTPATIENT PHYSICAL THERAPY NEURO TREATMENT     Patient Name: Gregory Crane MRN: [id number] DOB:November 13, 1970, 52 y.o., male Today's Date: 12/18/2022  PCP: Dale [place] MD REFERRING PROVIDER: Dale [place] MD  PT End of Session - 12/18/22 1004     Visit Number 45    Number of Visits 60    Date for PT Re-Evaluation 02/03/23    Authorization Type Medicare    Authorization Time Period 11/11/22-02/03/23    Progress Note Due on Visit 50    PT Start Time 1015    PT Stop Time 1058    PT Time Calculation (min) 43 min    Equipment Utilized During Treatment Gait belt    Activity Tolerance Patient tolerated treatment well;No increased pain;Patient limited by fatigue    Behavior During Therapy [facility] for tasks assessed/performed                     Past Medical History:  Diagnosis Date   Allergy    Anemia    Bell's palsy    Diabetes mellitus without complication    diet controlled   Hypertension    Hypothyroidism    Kidney stones    Pseudotumor cerebri    Stroke    Past Surgical History:  Procedure Laterality Date   COLONOSCOPY WITH PROPOFOL N/A 03/30/2020   Procedure: COLONOSCOPY WITH PROPOFOL;  Surgeon: Regis Bill, MD;  Location: [facility] ENDOSCOPY;  Service: Endoscopy;  Laterality: N/A;   LOOP RECORDER INSERTION N/A 01/27/2018   Procedure: LOOP RECORDER INSERTION;  Surgeon: Duke Salvia, MD;  Location: [facility] INVASIVE CV LAB;  Service: Cardiovascular;  Laterality: N/A;   LUMBAR PUNCTURE     as child   NO PAST SURGERIES     TEE WITHOUT CARDIOVERSION N/A 01/07/2018   Procedure: TRANSESOPHAGEAL ECHOCARDIOGRAM (TEE);  Surgeon: Antonieta Iba, MD;  Location: [facility] ORS;  Service: Cardiovascular;  Laterality: N/A;   Patient Active Problem List   Diagnosis Date Noted   Unsteady gait 10/05/2022   Hydronephrosis, left 05/04/2022   Type II diabetes mellitus with renal manifestations 05/04/2022   Leukocytosis 05/04/2022   Pre-op evaluation 04/24/2022   Headache  07/07/2021   Hearing loss 07/07/2021   Open wound 01/25/2021   History of colon polyps 10/15/2020   Postoperative hemorrhage involving digestive system following digestive system procedure 09/19/2020   Acute cholecystitis without calculus 09/09/2020   Type 2 diabetes mellitus, with long-term current use of insulin 09/09/2020   Cryptogenic stroke 07/13/2020   History of loop recorder 07/13/2020   Weakness 06/18/2020   History of 2019 novel coronavirus disease (COVID-19) 05/20/2020   ESRD (end stage renal disease) 04/04/2020   Pneumonia due to COVID-19 virus 04/03/2020   AKI (acute kidney injury) 04/03/2020   Elevated troponin 04/03/2020   Acquired trigger finger 06/08/2019   Lymphedema 06/08/2019   Anemia 04/17/2019   Swelling of left lower extremity 01/10/2019   Facial droop 04/09/2018   Daytime somnolence 03/30/2018   Carotid artery disease 02/03/2018   Intracranial vascular stenosis 09/12/2017   Cough 01/20/2017   Bell's palsy 11/10/2016   History of CVA (cerebrovascular accident) 11/10/2016   Benign localized hyperplasia of prostate with urinary obstruction 10/27/2016   History of nephrolithiasis 10/27/2016   TIA (transient ischemic attack) 10/20/2016   Near syncope 06/23/2016   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   Neuropathy 08/06/2015   Health care maintenance 08/06/2015   Essential hypertension 08/06/2015   Heme positive stool 10/10/2013   Hypothyroidism 10/10/2013   Microalbuminuria 10/10/2013  Hyperlipidemia 10/10/2013   B12 deficiency 10/10/2013   Diabetes 07/04/2013   Environmental allergies 07/04/2013   ONSET DATE: 2-3 years  REFERRING DIAG: Neuropathy  THERAPY DIAG:  Unsteadiness on feet  Difficulty in walking, not elsewhere classified  Other abnormalities of gait and mobility  Abnormality of gait and mobility  Rationale for Evaluation and Treatment Rehabilitation  SUBJECTIVE:                                                                                                                                                                                              SUBJECTIVE STATEMENT: Patient reports he continues to have lower back pain.    Pt accompanied by: self  PERTINENT HISTORY: Patient presents to physical therapy for neuropathy. He underwent banding of his L UE fistula on 05/13/22 and scheduled for additional banding on 06/07/22. 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 Patient reports his balance is very unsteady due to neuropathy in feet, feels weak in LE's.   PAIN:  No pain today, just soreness in legs  PRECAUTIONS: Fall  WEIGHT BEARING RESTRICTIONS Yes no lifting >5 lb in arm   FALLS: Has patient fallen in last 6 months? No  PLOF: Independent  PATIENT GOALS to be more steady and walk as normally as possible.   OBJECTIVE:   TODAY'S TREATMENT:   TherEx:  AA/ROM on Nustep level 2, seat 9, Right arms 9, left arm 9, x Cues for neutral hip ER/IR on the RLE,    Seated therex:   Seated heel raise squeezing ball x 20 Bil Sit<>stand with ball squeeze x 10 ; very challenging for patient Adduction squeeze with LAQ 10x  RTB march with df keeping feet with wide BOS 10x each LE RTB eversion 15x  6: step toe taps quick 30 seconds x 2 trials  Straight leg abduction/adduction 10x    3lb ankle weights: -standing heel raise 15x    Gait training with 3# ankle weights x 570ft cues for heel strike for improved balance and body mechanics. Mild stumble with GCA for safety, but able to correction without additional support from PT.    Neuro Re-ed: Single limb stance 30 seconds x 2 trials Heel toe rocking with support bar 10x each LE  Incline static 50 seconds  PATIENT EDUCATION: Education details: HEP, goals, POC. Pt educated throughout  session about proper posture and technique with exercises. Improved exercise technique, movement at target joints, use of target muscles after min to mod verbal,  visual, tactile cues.  Person educated: Patient Education method: Explanation, Demonstration, Tactile cues, Verbal cues, and Handouts Education comprehension: verbalized understanding, returned demonstration, verbal cues required, and tactile cues required  HOME EXERCISE PROGRAM: Access Code: [email or web address] URL: [email or web address] Date: 06/03/2022 Prepared by: Precious Bard  Exercises - Seated Heel Toe Raises  - 1 x daily - 7 x weekly - 2 sets - 10 reps - 5 hold - Standing Tandem Balance with Counter Support  - 1 x daily - 7 x weekly - 2 sets - 2 reps - 30 hold - Standing March with Counter Support  - 1 x daily - 7 x weekly - 2 sets - 10 reps - 5 hold   GOALS: Goals reviewed with patient? Yes   SHORT TERM GOALS: Target date: 07/01/2022  Patient will be independent in home exercise program to improve strength/mobility for better functional independence with ADLs. Baseline:10/9; HEP given 2/21: HEP compliant  Goal status: MET   LONG TERM GOALS: Target date: 02/03/2023  Patient will increase FOTO score to equal to or greater than   63%  to demonstrate statistically significant improvement in mobility and quality of life.  Baseline: 10/9: 53% 11/29: 58% 12/27: 51% 1/17: 56% 2/21: 53% 3/18: 60%  4/8 58% Goal status: IN PROGRESS  2.  Patient (< 5 years old) will complete five times sit to stand test in < 10 seconds without UE support indicating an increased LE strength and improved balance. Baseline: 10/9: 19.6 seconds with walking stick; one LOB 11/29: 20.56 seconds 12/27: 13.4 seconds hands on knees 1/17: 13 seconds hands on knees  2/21: 14 seconds no hands 3/18: 12 seconds no hands. 4/8 11.3 sec UE on knees.  Goal status: IN PROGRESS  3.  Patient will increase Berg Balance score by > 45/56 to demonstrate  decreased fall risk during functional activities. Baseline: 10/9: 33/56 11/29: 69/62 95/28: 41/32 4/40:  10/27 2/21: 46/56 3/18: 48/56  Goal status: MET  +4.  Patient will increase 10 meter walk test to >1.34m/s as to improve gait speed for better community ambulation and to reduce fall risk. Baseline: 11/29: 1.19 m/s Goal status: MET  5.  Patient will increase ABC scale score >80% to demonstrate better functional mobility and better confidence with ADLs.  Baseline: 10/9: 67% 11/29: 75.6% 12/27: 78%  2/21: 78% 3/18: 80% Goal status: MET  6. Patient will tolerate 5 seconds of single leg stance without loss of balance to improve ability to get in and out of shower safely. Baseline: 11/29: unable to stand on single leg safely 12/27: 2 seconds 1/17: 3 seconds 2/21: 3 seconds 3/18: 4 seconds. 4/8: 3 sec   Goal status: IN PROGRESS   6. Patient will increase six minute walk test distance to >1000 for progression to community ambulator and improve gait ability Baseline: 12/27: 845 ft with with walking stick 1/17: 752ft 2/21: 930 ft  3/18: 950 ft 4/8: 114ft Goal status: met  7. Patient will increase dynamic gait index score to >19/24 as to demonstrate reduced fall risk and improved dynamic gait balance for better safety with community/home ambulation.  Baseline: 3/18:  9/24. 4/8: 16/24 Goal status: in Progress   ASSESSMENT:  CLINICAL IMPRESSION:  Patient presents with excellent motivation. He continues to struggle with foot slap with ambulation. Foot clearance performed with focused interventions well. Sit to stands are challenged with adduction ball squeeze. Patient is improving with his ankle righting reactions but had room for improvement due to fatigue.  Pt will continue  to benefit from skilled PT to make progress in BLE strength, balance, and coordination to return to PLOF and improve QoL.   OBJECTIVE IMPAIRMENTS Abnormal gait, decreased activity tolerance, decreased balance, decreased  coordination, decreased endurance, decreased mobility, difficulty walking, decreased strength, impaired flexibility, impaired sensation, improper body mechanics, and pain.   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   REHAB POTENTIAL: Good  CLINICAL DECISION MAKING: Evolving/moderate complexity  EVALUATION COMPLEXITY: Moderate  PLAN: PT FREQUENCY: 2x/week  PT DURATION: 12 weeks  PLANNED INTERVENTIONS: Therapeutic exercises, Therapeutic activity, Neuromuscular re-education, Balance training, Gait training, Patient/Family education, Self Care, Joint mobilization, Stair training, Vestibular training, Canalith repositioning, Visual/preceptual remediation/compensation, DME instructions, Dry Needling, Cognitive remediation, Spinal mobilization, Cryotherapy, Moist heat, Manual lymph drainage, Compression bandaging, Taping, Vasopneumatic device, Ultrasound, Manual therapy, and Re-evaluation  PLAN FOR NEXT SESSION:   BLE strength, continue to progress dynamic balance and dual tasking balance. Continue to monitor BP as appropriate  Precious Bard PT ,DPT Physical Therapist- [place]  [facility]

## 2022-12-18 ENCOUNTER — Ambulatory Visit: Payer: Medicare Other | Admitting: Occupational Therapy

## 2022-12-18 ENCOUNTER — Encounter: Payer: Self-pay | Admitting: Occupational Therapy

## 2022-12-18 ENCOUNTER — Ambulatory Visit: Payer: Medicare Other

## 2022-12-18 DIAGNOSIS — M6281 Muscle weakness (generalized): Secondary | ICD-10-CM

## 2022-12-18 DIAGNOSIS — R278 Other lack of coordination: Secondary | ICD-10-CM

## 2022-12-18 DIAGNOSIS — R262 Difficulty in walking, not elsewhere classified: Secondary | ICD-10-CM

## 2022-12-18 DIAGNOSIS — R2689 Other abnormalities of gait and mobility: Secondary | ICD-10-CM

## 2022-12-18 DIAGNOSIS — R2681 Unsteadiness on feet: Secondary | ICD-10-CM

## 2022-12-18 DIAGNOSIS — R269 Unspecified abnormalities of gait and mobility: Secondary | ICD-10-CM

## 2022-12-18 NOTE — Therapy (Signed)
OCCUPATIONAL THERAPY NEURO TREATMENT NOTE  Patient Name: Gregory Crane MRN: [id number] DOB:1970-11-04, 52 y.o., male  PCP: Dale [place] REFERRING PROVIDER: Dale Latimer   END OF SESSION:  OT End of Session - 12/18/22 1107     Visit Number 12    Number of Visits 24    Date for OT Re-Evaluation 01/13/23    OT Start Time 1105    Activity Tolerance Patient tolerated treatment well    Behavior During Therapy [facility] for tasks assessed/performed              Past Medical History:  Diagnosis Date   Allergy    Anemia    Bell's palsy    Diabetes mellitus without complication    diet controlled   Hypertension    Hypothyroidism    Kidney stones    Pseudotumor cerebri    Stroke    Past Surgical History:  Procedure Laterality Date   COLONOSCOPY WITH PROPOFOL N/A 03/30/2020   Procedure: COLONOSCOPY WITH PROPOFOL;  Surgeon: Regis Bill, MD;  Location: [facility] ENDOSCOPY;  Service: Endoscopy;  Laterality: N/A;   LOOP RECORDER INSERTION N/A 01/27/2018   Procedure: LOOP RECORDER INSERTION;  Surgeon: Duke Salvia, MD;  Location: [facility] INVASIVE CV LAB;  Service: Cardiovascular;  Laterality: N/A;   LUMBAR PUNCTURE     as child   NO PAST SURGERIES     TEE WITHOUT CARDIOVERSION N/A 01/07/2018   Procedure: TRANSESOPHAGEAL ECHOCARDIOGRAM (TEE);  Surgeon: Antonieta Iba, MD;  Location: [facility] ORS;  Service: Cardiovascular;  Laterality: N/A;   Patient Active Problem List   Diagnosis Date Noted   Unsteady gait 10/05/2022   Hydronephrosis, left 05/04/2022   Type II diabetes mellitus with renal manifestations 05/04/2022   Leukocytosis 05/04/2022   Pre-op evaluation 04/24/2022   Headache 07/07/2021   Hearing loss 07/07/2021   Open wound 01/25/2021   History of colon polyps 10/15/2020   Postoperative hemorrhage involving digestive system following digestive system procedure 09/19/2020   Acute cholecystitis without calculus 09/09/2020   Type 2 diabetes mellitus, with long-term  current use of insulin 09/09/2020   Cryptogenic stroke 07/13/2020   History of loop recorder 07/13/2020   Weakness 06/18/2020   History of 2019 novel coronavirus disease (COVID-19) 05/20/2020   ESRD (end stage renal disease) 04/04/2020   Pneumonia due to COVID-19 virus 04/03/2020   AKI (acute kidney injury) 04/03/2020   Elevated troponin 04/03/2020   Acquired trigger finger 06/08/2019   Lymphedema 06/08/2019   Anemia 04/17/2019   Swelling of left lower extremity 01/10/2019   Facial droop 04/09/2018   Daytime somnolence 03/30/2018   Carotid artery disease 02/03/2018   Intracranial vascular stenosis 09/12/2017   Cough 01/20/2017   Bell's palsy 11/10/2016   History of CVA (cerebrovascular accident) 11/10/2016   Benign localized hyperplasia of prostate with urinary obstruction 10/27/2016   History of nephrolithiasis 10/27/2016   TIA (transient ischemic attack) 10/20/2016   Near syncope 06/23/2016   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   Neuropathy 08/06/2015   Health care maintenance 08/06/2015   Essential hypertension 08/06/2015   Heme positive stool 10/10/2013   Hypothyroidism 10/10/2013   Microalbuminuria 10/10/2013   Hyperlipidemia 10/10/2013   B12 deficiency 10/10/2013   Diabetes 07/04/2013   Environmental allergies 07/04/2013    ONSET DATE: May 2023  REFERRING DIAG: CVA  THERAPY DIAG:  Muscle weakness (generalized)  Other lack of coordination  Unsteadiness on feet  Rationale for Evaluation and Treatment: Rehabilitation  SUBJECTIVE:  SUBJECTIVE STATEMENT:  Pt reports his friend  is here visiting and at home right now with a repairman for his washer.  No pain noted today.    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    PRECAUTIONS: Fall and Other: No strengthening exercises greater than 5-10 pounds in left arm.   Dialysis Tues, Th, Sat.  WEIGHT BEARING RESTRICTIONS: No  PAIN:  Are you having pain? No  FALLS: Has patient fallen in last 6 months? No  LIVING ENVIRONMENT: Lives with: alone Lives in: House/apartment Stairs: Yes: Internal: 15 steps; on right going up Has following equipment at home: Single point cane and Walker - 2 wheeled  PLOF: Independent  PATIENT GOALS: Pt reports he wants to have better use of his left hand, be able to do buttons and shoes.   OBJECTIVE:   HAND DOMINANCE: Right  ADLs: Overall ADLs: modified independent with most tasks Transfers/ambulation related to ADLs: Now walking without an assistive device, used cane in the past.  Eating: Independent with self feeding, difficulty with cutting food, requires increased focus and effort. Grooming: Independent UB Dressing: Difficulty with buttons, uses pullovers most of the time  LB Dressing: Difficulty with tying shoes Toileting: Independent Bathing: Independent Tub Shower transfers: Independent Equipment: none  IADLs: Shopping: independent,  uses a cart to push in the store Light housekeeping: modified independent Meal Prep: difficulty with tasks requiring use of left hand, bilateral hand use.   Community mobility: Ambulates without use of assistive device, able to drive Medication management: Independent Financial management: Independent Handwriting: 90% legible  MOBILITY STATUS: Needs Assist: Pt has progressed with PT, used to use a cane but ambulating without an assistive device but still has balance difficulties.    POSTURE COMMENTS:    Sitting balance: Sits without UE support up to 30 sec  ACTIVITY TOLERANCE: Activity tolerance: limited activity  tolerance  FUNCTIONAL OUTCOME MEASURES: FOTO: 53  UPPER EXTREMITY ROM:    Active ROM Right eval Left eval  Shoulder flexion [facility]  Shoulder abduction [facility]  Shoulder adduction [facility]  Shoulder extension [facility]  Shoulder internal rotation [facility]  Shoulder external rotation [facility]  Elbow flexion [facility] WFL  Elbow extension [facility]  Wrist flexion [facility] WFL  Wrist extension [facility] WFL  Wrist ulnar deviation WFL WFL  Wrist radial deviation WFL WFL  Wrist pronation WFL WFL  Wrist supination WFL WFL  (Blank rows = not tested)  UPPER EXTREMITY MMT:     MMT Right eval Left eval  Shoulder flexion 5/5 3+/5  Shoulder abduction 5/5 3+/5  Shoulder adduction    Shoulder extension    Shoulder internal rotation    Shoulder external rotation    Middle trapezius    Lower trapezius    Elbow flexion 5/5 3+/5  Elbow extension 5/5 3+/5  Wrist flexion 5/5 3+/5  Wrist extension 5/5 3+/5  Wrist ulnar deviation 5/5 3+/5  Wrist radial deviation 5/5 3+/5  Wrist pronation 5/5 3+/5  Wrist supination 5/5 3+/5  (Blank rows = not tested)  Opposition of thumb to all digits on right, on left hand opposition to ring finger but cannot demonstrate to small finger.  HAND FUNCTION: Grip strength: Right: 44 lbs; Left: 35 lbs, Lateral pinch: Right: 17 lbs, Left: 16 lbs, 3 point pinch: Right: 14 lbs, Left: 0 lbs, and Tip pinch: Right 2 lbs, Left: 0 lbs  COORDINATION: 9 Hole Peg test: Right: 36 sec; Left: 56 sec  TODAY'S TREATMENT: 12/18/2022  Neuromuscular re-education: Pt seen for manipulation skills and fine motor coordination with use of checkers to place into Connect 5 board in a vertical plane.  Pt instructed to pick up 4 checkers one at a time, move to palm, using hand for storage.  Pt then moving a checker to fingertips, alternating colors for increased complexity.  Pt dropping items occasionally.  Occasional cues for prehension patterns with a tip to tip pinch.   Pt seen for fine motor  coordination skills of linking large paperclips in a line and then removing.  Unlinking is a greater challenge for patient this date.                                                                                                                                 Therapeutic Exercises: Resistive finger strengthening with use of velcro resistive squares to place and remove from board, cues for prehension patterns.  Tip to tip prehension on 1 inch squares and then also using loops for one round to place and remove.  2# wrist weight to left wrist while moving shapes to 4 levels of graduated reach.      PATIENT EDUCATION: Education details: fine motor coordination, manipulation skills, L hand strengthening Person educated: Patient Education method: Explanation Education comprehension: verbalized understanding  HOME EXERCISE PROGRAM: Will provide HEP in 1-2 visits  GOALS: Goals reviewed with patient? Yes  SHORT TERM GOALS: Target date:  11/18/2022   Pt will demonstrate ability to hold and utilize utensils for cutting meat with modified independence. Baseline: difficulty with cutting, stabilizing item with fork for cutting. Goal status: IN PROGRESS  2.  Pt will demonstrate ability to obtain keys from left pocket without difficult and without dropping.   Baseline: pt struggles with reaching into left pocket to get keys.   Goal status: IN PROGRESS   LONG TERM GOALS: Target date: 01/13/2023   Pt will demonstrate HEP with modified independence.   Baseline: no current program for left UE.  Goal status: IN PROGRESS  2.  Pt will improve left UE coordination by 10 secs on 9 hole peg test to demonstrate shoe tying with modified independence.  Baseline: currently wearing slip on shoes and unable to tie shoes, left 9 hole peg test in 56 sec at eval  Goal status: IN PROGRESS  3.  Pt will demonstrate improved coordination in left hand to demonstrate buttoning buttons on clothing with modified  independence.  Baseline: difficulty with buttons and has been wearing more pullovers to avoid challenge of buttons. Goal status: IN PROGRESS  4.  Pt will demonstrate improvement in left grip strength by 5# to  open jars and containers with modified independence and greater ease.   Baseline: Difficulty with opening jars and containers with use of left hand at eval.  Goal status: IN PROGRESS  5.  Pt will improve left UE strength by 1 mm grade to hold and hand wash dishes with modified independence.  Baseline: difficulty with being able to hand wash dishes at eval, difficulty stabilizing dish. Goal status: IN PROGRESS  6.  Pt will demonstrate FOTO score of 59 or greater to show a clinically relevant change in LUE to impact greater independence with daily ADL and IADL tasks at home and in the community.   Baseline:  Goal status: IN PROGRESS  ASSESSMENT: CLINICAL IMPRESSION: Pt reports he is feeling really tired today, dialysis has been hard lately and they have had to remove a lot of fluid each time.  Pt continues to drop items with attempts at manipulation.  He demonstrates difficulty with translatory movements of the hand to move items from palm back to fingertips but with increased time, cues and focus, he is able to perform slowly.  Pt able to tolerate reaching with 2# this date however fatigues quickly and benefits from short rest breaks during activity.  Continue to work towards goals in plan of care to improve left UE function and strength overall to improve daily performance in ADL and IADL tasks.     PERFORMANCE DEFICITS: in functional skills including ADLs, IADLs, coordination, dexterity, sensation, tone, ROM, strength, flexibility, Fine motor control, balance, endurance, decreased knowledge of use of DME, and UE functional use, cognitive skills including, and psychosocial skills including environmental adaptation, habits, and routines and behaviors.   IMPAIRMENTS: are limiting patient  from ADLs, IADLs, and social participation.   CO-MORBIDITIES: has co-morbidities such as CAD, ESRD with dialysis, deafness in right ear, diabetic retinopathy, HTN, neuropathy  that affects occupational performance. Patient will benefit from skilled OT to address above impairments and improve overall function.  MODIFICATION OR ASSISTANCE TO COMPLETE EVALUATION: Min-Moderate modification of tasks or assist with assess necessary to complete an evaluation.  OT OCCUPATIONAL PROFILE AND HISTORY: Comprehensive assessment: Review of records and extensive additional review of physical, cognitive, psychosocial history related to current functional performance.  CLINICAL DECISION MAKING: Moderate - several treatment options, min-mod task modification necessary  REHAB POTENTIAL: Good  EVALUATION COMPLEXITY: Moderate    PLAN:  OT FREQUENCY: 2x/week  OT DURATION: 12 weeks  PLANNED INTERVENTIONS: self care/ADL training, therapeutic exercise, therapeutic activity, neuromuscular re-education, manual therapy, balance training, paraffin, moist heat, contrast bath, patient/family education, and DME and/or AE instructions  RECOMMENDED OTHER SERVICES: Pt is being seen by PT currently.   CONSULTED AND AGREED WITH PLAN OF CARE: Patient  PLAN FOR NEXT SESSION: Initiate HEP for grip, pinch and coordination skills.   Sadae Arrazola Cornelius Moras, OTR/L, CLT  24/Apr/2024 915 am

## 2022-12-23 ENCOUNTER — Ambulatory Visit: Payer: Medicare Other

## 2022-12-23 ENCOUNTER — Ambulatory Visit: Payer: Medicare Other | Admitting: Occupational Therapy

## 2022-12-23 ENCOUNTER — Encounter: Payer: Self-pay | Admitting: Occupational Therapy

## 2022-12-23 DIAGNOSIS — R278 Other lack of coordination: Secondary | ICD-10-CM

## 2022-12-23 DIAGNOSIS — R2681 Unsteadiness on feet: Secondary | ICD-10-CM

## 2022-12-23 DIAGNOSIS — R262 Difficulty in walking, not elsewhere classified: Secondary | ICD-10-CM

## 2022-12-23 DIAGNOSIS — M6281 Muscle weakness (generalized): Secondary | ICD-10-CM

## 2022-12-23 NOTE — Therapy (Signed)
OUTPATIENT PHYSICAL THERAPY NEURO TREATMENT     Patient Name: Gregory Crane MRN: [id number] DOB:07/13/1971, 52 y.o., male Today's Date: 12/23/2022  PCP: Dale Galena MD REFERRING PROVIDER: Dale [place] MD  PT End of Session - 12/23/22 0809     Visit Number 46    Number of Visits 60    Date for PT Re-Evaluation 02/03/23    Authorization Type Medicare    Authorization Time Period 11/11/22-02/03/23    Progress Note Due on Visit 50    PT Start Time 0805    PT Stop Time 0845    PT Time Calculation (min) 40 min    Equipment Utilized During Treatment Gait belt    Activity Tolerance Patient tolerated treatment well;No increased pain    Behavior During Therapy WFL for tasks assessed/performed                     Past Medical History:  Diagnosis Date   Allergy    Anemia    Bell's palsy    Diabetes mellitus without complication (HCC)    diet controlled   Hypertension    Hypothyroidism    Kidney stones    Pseudotumor cerebri    Stroke [facility])    Past Surgical History:  Procedure Laterality Date   COLONOSCOPY WITH PROPOFOL N/A 03/30/2020   Procedure: COLONOSCOPY WITH PROPOFOL;  Surgeon: Regis Bill, MD;  Location: [facility] ENDOSCOPY;  Service: Endoscopy;  Laterality: N/A;   LOOP RECORDER INSERTION N/A 01/27/2018   Procedure: LOOP RECORDER INSERTION;  Surgeon: Duke Salvia, MD;  Location: [facility] INVASIVE CV LAB;  Service: Cardiovascular;  Laterality: N/A;   LUMBAR PUNCTURE     as child   NO PAST SURGERIES     TEE WITHOUT CARDIOVERSION N/A 01/07/2018   Procedure: TRANSESOPHAGEAL ECHOCARDIOGRAM (TEE);  Surgeon: Antonieta Iba, MD;  Location: [facility] ORS;  Service: Cardiovascular;  Laterality: N/A;   Patient Active Problem List   Diagnosis Date Noted   Unsteady gait 10/05/2022   Hydronephrosis, left 05/04/2022   Type II diabetes mellitus with renal manifestations (HCC) 05/04/2022   Leukocytosis 05/04/2022   Pre-op evaluation 04/24/2022   Headache 07/07/2021    Hearing loss 07/07/2021   Open wound 01/25/2021   History of colon polyps 10/15/2020   Postoperative hemorrhage involving digestive system following digestive system procedure 09/19/2020   Acute cholecystitis without calculus 09/09/2020   Type 2 diabetes mellitus, with long-term current use of insulin (HCC) 09/09/2020   Cryptogenic stroke (HCC) 07/13/2020   History of loop recorder 07/13/2020   Weakness 06/18/2020   History of 2019 novel coronavirus disease (COVID-19) 05/20/2020   ESRD (end stage renal disease) (HCC) 04/04/2020   Pneumonia due to COVID-19 virus 04/03/2020   AKI (acute kidney injury) (HCC) 04/03/2020   Elevated troponin 04/03/2020   Acquired trigger finger 06/08/2019   Lymphedema 06/08/2019   Anemia 04/17/2019   Swelling of left lower extremity 01/10/2019   Facial droop 04/09/2018   Daytime somnolence 03/30/2018   Carotid artery disease (HCC) 02/03/2018   Intracranial vascular stenosis 09/12/2017   Cough 01/20/2017   Bell's palsy 11/10/2016   History of CVA (cerebrovascular accident) 11/10/2016   Benign localized hyperplasia of prostate with urinary obstruction 10/27/2016   History of nephrolithiasis 10/27/2016   TIA (transient ischemic attack) 10/20/2016   Near syncope 06/23/2016   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   Neuropathy 08/06/2015   Health care maintenance 08/06/2015   Essential hypertension 08/06/2015   Heme positive stool 10/10/2013   Hypothyroidism  10/10/2013   Microalbuminuria 10/10/2013   Hyperlipidemia 10/10/2013   B12 deficiency 10/10/2013   Diabetes (HCC) 07/04/2013   Environmental allergies 07/04/2013   ONSET DATE: 2-3 years  REFERRING DIAG: Neuropathy  THERAPY DIAG:  Muscle weakness (generalized)  Other lack of coordination  Unsteadiness on feet  Difficulty in walking, not elsewhere classified  Rationale for Evaluation and Treatment Rehabilitation  SUBJECTIVE:                                                                                                                                                                                              SUBJECTIVE STATEMENT: Patient reports he feels good. Overslept and got here quickly.   Pt accompanied by: self  PERTINENT HISTORY: Patient presents to physical therapy for neuropathy. He underwent banding of his L UE fistula on 05/13/22 and scheduled for additional banding on 06/07/22. 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 Patient reports his balance is very unsteady due to neuropathy in feet, feels weak in LE's.   PAIN:  No   PRECAUTIONS: Fall  WEIGHT BEARING RESTRICTIONS Yes no lifting >5 lb in arm   PATIENT GOALS to be more steady and walk as normally as possible.   OBJECTIVE:   TODAY'S TREATMENT:    TherEx:  AA/ROM on Nustep level 2, seat 9, Right arms 9, left arm 9, x    Seated therex:  Sit<>stand x 10, hands free  Seated heel raise squeezing ball x 20 Bil GTB eversion 12x, inversion 12x   Adduction squeeze with LAQ 10x   453ft 3lb AW bilat     PATIENT EDUCATION: Education details: HEP, goals, POC. Pt educated throughout session about proper posture and technique with exercises. Improved exercise technique, movement at target joints, use of target muscles after min to mod verbal, visual, tactile cues.  Person educated: Patient Education method: Explanation, Demonstration, Tactile cues, Verbal cues, and Handouts Education comprehension: verbalized understanding, returned demonstration, verbal cues required, and tactile cues required  HOME EXERCISE PROGRAM: Access Code: [email or web address] URL: [email or web address] Date: 06/03/2022 Prepared by: Precious Bard  Exercises - Seated Heel Toe Raises  - 1 x daily - 7 x weekly - 2 sets - 10 reps - 5 hold -  Standing Tandem Balance with Counter Support  - 1 x daily - 7 x weekly - 2 sets - 2 reps - 30 hold - Standing March with Counter Support  - 1 x daily - 7 x weekly - 2 sets - 10 reps -  5 hold   GOALS: Goals reviewed with patient? Yes   SHORT TERM GOALS: Target date: 07/01/2022  Patient will be independent in home exercise program to improve strength/mobility for better functional independence with ADLs. Baseline:10/9; HEP given 2/21: HEP compliant  Goal status: MET   LONG TERM GOALS: Target date: 02/03/2023  Patient will increase FOTO score to equal to or greater than   63%  to demonstrate statistically significant improvement in mobility and quality of life.  Baseline: 10/9: 53% 11/29: 58% 12/27: 51% 1/17: 56% 2/21: 53% 3/18: 60%  4/8 58% Goal status: IN PROGRESS  2.  Patient (< 9 years old) will complete five times sit to stand test in < 10 seconds without UE support indicating an increased LE strength and improved balance. Baseline: 10/9: 19.6 seconds with walking stick; one LOB 11/29: 20.56 seconds 12/27: 13.4 seconds hands on knees 1/17: 13 seconds hands on knees  2/21: 14 seconds no hands 3/18: 12 seconds no hands. 4/8 11.3 sec UE on knees.  Goal status: IN PROGRESS  3.  Patient will increase Berg Balance score by > 45/56 to demonstrate decreased fall risk during functional activities. Baseline: 10/9: 33/56 11/29: 16/10 96/04: 54/09 8/11:  91/47 2/21: 46/56 3/18: 48/56  Goal status: MET  +4.  Patient will increase 10 meter walk test to >1.25m/s as to improve gait speed for better community ambulation and to reduce fall risk. Baseline: 11/29: 1.19 m/s Goal status: MET  5.  Patient will increase ABC scale score >80% to demonstrate better functional mobility and better confidence with ADLs.  Baseline: 10/9: 67% 11/29: 75.6% 12/27: 78%  2/21: 78% 3/18: 80% Goal status: MET  6. Patient will tolerate 5 seconds of single leg stance without loss of balance to improve ability to get in  and out of shower safely. Baseline: 11/29: unable to stand on single leg safely 12/27: 2 seconds 1/17: 3 seconds 2/21: 3 seconds 3/18: 4 seconds. 4/8: 3 sec   Goal status: IN PROGRESS   6. Patient will increase six minute walk test distance to >1000 for progression to community ambulator and improve gait ability Baseline: 12/27: 845 ft with with walking stick 1/17: 761ft 2/21: 930 ft  3/18: 950 ft 4/8: 1156ft Goal status: met  7. Patient will increase dynamic gait index score to >19/24 as to demonstrate reduced fall risk and improved dynamic gait balance for better safety with community/home ambulation.  Baseline: 3/18:  9/24. 4/8: 16/24 Goal status: in Progress   ASSESSMENT:  CLINICAL IMPRESSION: Still working on activity tolerance, strength, and dynamic balance/righting strategies in the setting of bilat neuropathic ankle weakness. Pt continues to show progress in all areas. Rest breaks provided as needed in a predictable fashion. Pt will continue to benefit from skilled PT to make progress in BLE strength, balance, and coordination to return to PLOF and improve QoL.   OBJECTIVE IMPAIRMENTS Abnormal gait, decreased activity tolerance, decreased balance, decreased coordination, decreased endurance, decreased mobility, difficulty walking, decreased strength, impaired flexibility, impaired sensation, improper body mechanics, and pain.   ACTIVITY LIMITATIONS carrying, lifting, bending, standing, squatting, sleeping, stairs, transfers, bed mobility, bathing, toileting, dressing, locomotion level, and caring for others  PARTICIPATION LIMITATIONS: meal prep, cleaning, laundry, medication management, personal finances, interpersonal relationship, driving, shopping, community activity, and yard work  PERSONAL FACTORS Age, Fitness, Past/current experiences, Time since onset of injury/illness/exacerbation, Transportation, and 3+ comorbidities: CAD, cryptogenic stroke (2021), deafness in R ear,  diabetic retinopathy, dialysis, ESRD, COVID, HTN, hypothyroidism, leukocytosis, nephrolithiasis, neuropathy, Pseudotumor  cerebri 1986, sepsis, DM type I, DM type II  are also affecting patient's functional outcome.   REHAB POTENTIAL: Good  CLINICAL DECISION MAKING: Evolving/moderate complexity  EVALUATION COMPLEXITY: Moderate  PLAN: PT FREQUENCY: 2x/week  PT DURATION: 12 weeks  PLANNED INTERVENTIONS: Therapeutic exercises, Therapeutic activity, Neuromuscular re-education, Balance training, Gait training, Patient/Family education, Self Care, Joint mobilization, Stair training, Vestibular training, Canalith repositioning, Visual/preceptual remediation/compensation, DME instructions, Dry Needling, Cognitive remediation, Spinal mobilization, Cryotherapy, Moist heat, Manual lymph drainage, Compression bandaging, Taping, Vasopneumatic device, Ultrasound, Manual therapy, and Re-evaluation  PLAN FOR NEXT SESSION:  BLE strength, continue to progress dynamic balance and dual tasking balance. Continue to monitor BP as appropriate  Rosamaria Lints PT ,DPT Physical Therapist- [facility]   8:19 AM, 12/23/22 Rosamaria Lints, PT, DPT Physical Therapist - [facility]  Outpatient Physical Therapy- Main Campus [phone]

## 2022-12-23 NOTE — Therapy (Signed)
OCCUPATIONAL THERAPY NEURO TREATMENT NOTE  Patient Name: Gregory Crane MRN: [id number] DOB:Oct 21, 1970, 52 y.o., male  PCP: Dale [facility] REFERRING PROVIDER: Dale    END OF SESSION:  OT End of Session - 12/23/22 0845     Visit Number 13    Number of Visits 24    Date for OT Re-Evaluation 01/13/23    OT Start Time 0845    Activity Tolerance Patient tolerated treatment well    Behavior During Therapy [facility] for tasks assessed/performed              Past Medical History:  Diagnosis Date   Allergy    Anemia    Bell's palsy    Diabetes mellitus without complication (HCC)    diet controlled   Hypertension    Hypothyroidism    Kidney stones    Pseudotumor cerebri    Stroke [facility])    Past Surgical History:  Procedure Laterality Date   COLONOSCOPY WITH PROPOFOL N/A 03/30/2020   Procedure: COLONOSCOPY WITH PROPOFOL;  Surgeon: Regis Bill, MD;  Location: [facility] ENDOSCOPY;  Service: Endoscopy;  Laterality: N/A;   LOOP RECORDER INSERTION N/A 01/27/2018   Procedure: LOOP RECORDER INSERTION;  Surgeon: Duke Salvia, MD;  Location: [facility] INVASIVE CV LAB;  Service: Cardiovascular;  Laterality: N/A;   LUMBAR PUNCTURE     as child   NO PAST SURGERIES     TEE WITHOUT CARDIOVERSION N/A 01/07/2018   Procedure: TRANSESOPHAGEAL ECHOCARDIOGRAM (TEE);  Surgeon: Antonieta Iba, MD;  Location: [facility] ORS;  Service: Cardiovascular;  Laterality: N/A;   Patient Active Problem List   Diagnosis Date Noted   Unsteady gait 10/05/2022   Hydronephrosis, left 05/04/2022   Type II diabetes mellitus with renal manifestations (HCC) 05/04/2022   Leukocytosis 05/04/2022   Pre-op evaluation 04/24/2022   Headache 07/07/2021   Hearing loss 07/07/2021   Open wound 01/25/2021   History of colon polyps 10/15/2020   Postoperative hemorrhage involving digestive system following digestive system procedure 09/19/2020   Acute cholecystitis without calculus 09/09/2020   Type 2 diabetes mellitus,  with long-term current use of insulin (HCC) 09/09/2020   Cryptogenic stroke (HCC) 07/13/2020   History of loop recorder 07/13/2020   Weakness 06/18/2020   History of 2019 novel coronavirus disease (COVID-19) 05/20/2020   ESRD (end stage renal disease) (HCC) 04/04/2020   Pneumonia due to COVID-19 virus 04/03/2020   AKI (acute kidney injury) (HCC) 04/03/2020   Elevated troponin 04/03/2020   Acquired trigger finger 06/08/2019   Lymphedema 06/08/2019   Anemia 04/17/2019   Swelling of left lower extremity 01/10/2019   Facial droop 04/09/2018   Daytime somnolence 03/30/2018   Carotid artery disease (HCC) 02/03/2018   Intracranial vascular stenosis 09/12/2017   Cough 01/20/2017   Bell's palsy 11/10/2016   History of CVA (cerebrovascular accident) 11/10/2016   Benign localized hyperplasia of prostate with urinary obstruction 10/27/2016   History of nephrolithiasis 10/27/2016   TIA (transient ischemic attack) 10/20/2016   Near syncope 06/23/2016   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   Neuropathy 08/06/2015   Health care maintenance 08/06/2015   Essential hypertension 08/06/2015   Heme positive stool 10/10/2013   Hypothyroidism 10/10/2013   Microalbuminuria 10/10/2013   Hyperlipidemia 10/10/2013   B12 deficiency 10/10/2013   Diabetes (HCC) 07/04/2013   Environmental allergies 07/04/2013    ONSET DATE: May 2023  REFERRING DIAG: CVA  THERAPY DIAG:  Muscle weakness (generalized)  Other lack of coordination  Unsteadiness on feet  Rationale for Evaluation and Treatment: Rehabilitation  SUBJECTIVE:  SUBJECTIVE STATEMENT: Pt reports his friend had to leave unexpectedly due to his dad being sick.  Pt reports he woke up late this morning but made it in time for therapy.   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    PRECAUTIONS: Fall and Other: No strengthening exercises greater than 5-10 pounds in left arm.   Dialysis Tues, Th, Sat.  WEIGHT BEARING RESTRICTIONS: No  PAIN:  Are you having pain? No  FALLS: Has patient fallen in last 6 months? No  LIVING ENVIRONMENT: Lives with: alone Lives in: House/apartment Stairs: Yes: Internal: 15 steps; on right going up Has following equipment at home: Single point cane and Walker - 2 wheeled  PLOF: Independent  PATIENT GOALS: Pt reports he wants to have better use of his left hand, be able to do buttons and shoes.   OBJECTIVE:   HAND DOMINANCE: Right  ADLs: Overall ADLs: modified independent with most tasks Transfers/ambulation related to ADLs: Now walking without an assistive device, used cane in the past.  Eating: Independent with self feeding, difficulty with cutting food, requires increased focus and effort. Grooming: Independent UB Dressing: Difficulty with buttons, uses pullovers most of the time  LB Dressing: Difficulty with tying shoes Toileting: Independent Bathing: Independent Tub Shower transfers: Independent Equipment: none  IADLs: Shopping: independent,  uses a cart to push in the store Light housekeeping: modified independent Meal Prep: difficulty with tasks requiring use of left hand, bilateral hand use.   Community mobility: Ambulates without use of assistive device, able to drive Medication management: Independent Financial management: Independent Handwriting: 90% legible  MOBILITY STATUS: Needs Assist: Pt has progressed with PT, used to use a cane but ambulating without an assistive device but still has balance difficulties.    POSTURE COMMENTS:    Sitting balance: Sits without UE support up to 30  sec  ACTIVITY TOLERANCE: Activity tolerance: limited activity tolerance  FUNCTIONAL OUTCOME MEASURES: FOTO: 53  UPPER EXTREMITY ROM:    Active ROM Right eval Left eval  Shoulder flexion [facility]  Shoulder abduction [facility]  Shoulder adduction [facility]  Shoulder extension [facility]  Shoulder internal rotation [facility]  Shoulder external rotation [name]  Elbow flexion [facility] WFL  Elbow extension [name]  Wrist flexion [facility] WFL  Wrist extension [facility] WFL  Wrist ulnar deviation [facility] WFL  Wrist radial deviation WFL WFL  Wrist pronation WFL WFL  Wrist supination WFL WFL  (Blank rows = not tested)  UPPER EXTREMITY MMT:     MMT Right eval Left eval  Shoulder flexion 5/5 3+/5  Shoulder abduction 5/5 3+/5  Shoulder adduction    Shoulder extension    Shoulder internal rotation    Shoulder external rotation    Middle trapezius    Lower trapezius    Elbow flexion 5/5 3+/5  Elbow extension 5/5 3+/5  Wrist flexion 5/5 3+/5  Wrist extension 5/5 3+/5  Wrist ulnar deviation 5/5 3+/5  Wrist radial deviation 5/5 3+/5  Wrist pronation 5/5 3+/5  Wrist supination 5/5 3+/5  (Blank rows = not tested)  Opposition of thumb to all digits on right,  on left hand opposition to ring finger but cannot demonstrate to small finger.   HAND FUNCTION: Grip strength: Right: 44 lbs; Left: 35 lbs, Lateral pinch: Right: 17 lbs, Left: 16 lbs, 3 point pinch: Right: 14 lbs, Left: 0 lbs, and Tip pinch: Right 2 lbs, Left: 0 lbs Measured 12/23/2022: Grip strength: Right: 50 lbs; Left: 43 lbs, Lateral pinch: Right: 10 lbs, Left: 8 lbs, 3 point pinch: Right:  9 lbs, Left: 8 lbs, and Tip pinch: Right 8 lbs, Left: 4 lbs  COORDINATION: 9 Hole Peg test: Right: 36 sec; Left: 56 sec Measured 12/23/2022: 9 Hole Peg test: Right: 32 sec; Left: 45 sec  TODAY'S TREATMENT: 12/23/2022  Neuromuscular re-education: Pt seen this date with focus on fine motor coordination skills with use of small washers and dowels placed in a  variety of planes.  Occasional cues for prehension patterns.  Washers retrieved from magnetic dish to increase finger strengthening and coordination.  Pt picking up and manipulating small to large buttons from flat tabletop surface with left hand.  Difficulty noted at times picking up smaller buttons from flat surface.  Dropping items frequently.                                                                                                                                 Therapeutic Exercises: Reassessment of grip, pinch and 9 hole peg test, see above for details.  Resistive pinch pins with left hand to reach and place onto dowels in a variety of planes.  Difficulty with black level resistance (most resistive) but able to perform 2 pins.  Finger strengthening with use of green resistive putty, pinching, pulling and removing 15 small pegs from putty.       PATIENT EDUCATION: Education details: fine motor coordination, manipulation skills, L hand strengthening Person educated: Patient Education method: Explanation Education comprehension: verbalized understanding  HOME EXERCISE PROGRAM: Will provide HEP in 1-2 visits  GOALS: Goals reviewed with patient? Yes  SHORT TERM GOALS: Target date:  11/18/2022   Pt will demonstrate ability to hold and utilize utensils for cutting meat with modified independence. Baseline: difficulty with cutting, stabilizing item with fork for cutting. Goal status: IN PROGRESS  2.  Pt will demonstrate ability to obtain keys from left pocket without difficult and without dropping.   Baseline: pt struggles with reaching into left pocket to get keys.   Goal status: IN PROGRESS   LONG TERM GOALS: Target date: 01/13/2023   Pt will demonstrate HEP with modified independence.   Baseline: no current program for left UE.  Goal status: IN PROGRESS  2.  Pt will improve left UE coordination by 10 secs on 9 hole peg test to demonstrate shoe tying with modified  independence.  Baseline: currently wearing slip on shoes and unable to tie shoes, left 9 hole peg test in 56 sec at eval  Goal status: IN PROGRESS  3.  Pt will demonstrate improved coordination  in left hand to demonstrate buttoning buttons on clothing with modified independence.  Baseline: difficulty with buttons and has been wearing more pullovers to avoid challenge of buttons. Goal status: IN PROGRESS  4.  Pt will demonstrate improvement in left grip strength by 5# to open jars and containers with modified independence and greater ease.   Baseline: Difficulty with opening jars and containers with use of left hand at eval.  Goal status: IN PROGRESS  5.  Pt will improve left UE strength by 1 mm grade to hold and hand wash dishes with modified independence.  Baseline: difficulty with being able to hand wash dishes at eval, difficulty stabilizing dish. Goal status: IN PROGRESS  6.  Pt will demonstrate FOTO score of 59 or greater to show a clinically relevant change in LUE to impact greater independence with daily ADL and IADL tasks at home and in the community.   Baseline:  Goal status: IN PROGRESS  ASSESSMENT: CLINICAL IMPRESSION: Pt reports he is feeling really tired today, dialysis has been hard lately and they have had to remove a lot of fluid each time.  Pt continues to drop items with attempts at manipulation.  He demonstrates difficulty with translatory movements of the hand to move items from palm back to fingertips but with increased time, cues and focus, he is able to perform slowly.  Pt able to tolerate reaching with 2# this date however fatigues quickly and benefits from short rest breaks during activity.  Continue to work towards goals in plan of care to improve left UE function and strength overall to improve daily performance in ADL and IADL tasks.     PERFORMANCE DEFICITS: in functional skills including ADLs, IADLs, coordination, dexterity, sensation, tone, ROM, strength,  flexibility, Fine motor control, balance, endurance, decreased knowledge of use of DME, and UE functional use, cognitive skills including, and psychosocial skills including environmental adaptation, habits, and routines and behaviors.   IMPAIRMENTS: are limiting patient from ADLs, IADLs, and social participation.   CO-MORBIDITIES: has co-morbidities such as CAD, ESRD with dialysis, deafness in right ear, diabetic retinopathy, HTN, neuropathy  that affects occupational performance. Patient will benefit from skilled OT to address above impairments and improve overall function.  MODIFICATION OR ASSISTANCE TO COMPLETE EVALUATION: Min-Moderate modification of tasks or assist with assess necessary to complete an evaluation.  OT OCCUPATIONAL PROFILE AND HISTORY: Comprehensive assessment: Review of records and extensive additional review of physical, cognitive, psychosocial history related to current functional performance.  CLINICAL DECISION MAKING: Moderate - several treatment options, min-mod task modification necessary  REHAB POTENTIAL: Good  EVALUATION COMPLEXITY: Moderate    PLAN:  OT FREQUENCY: 2x/week  OT DURATION: 12 weeks  PLANNED INTERVENTIONS: self care/ADL training, therapeutic exercise, therapeutic activity, neuromuscular re-education, manual therapy, balance training, paraffin, moist heat, contrast bath, patient/family education, and DME and/or AE instructions  RECOMMENDED OTHER SERVICES: Pt is being seen by PT currently.   CONSULTED AND AGREED WITH PLAN OF CARE: Patient  PLAN FOR NEXT SESSION: Initiate HEP for grip, pinch and coordination skills.   Ayson Cherubini T Jessalynn Mccowan, OTR/L, CLT

## 2022-12-24 ENCOUNTER — Ambulatory Visit: Payer: Medicare Other

## 2022-12-24 NOTE — Therapy (Signed)
OUTPATIENT PHYSICAL THERAPY NEURO TREATMENT     Patient Name: Gregory Crane MRN: [id number] DOB:June 27, 1971, 52 y.o., male Today's Date: 12/25/2022  PCP: Dale Cascade MD REFERRING PROVIDER: Dale St. Helens MD  PT End of Session - 12/25/22 0806     Visit Number 47    Number of Visits 60    Date for PT Re-Evaluation 02/03/23    Authorization Type Medicare    Authorization Time Period 11/11/22-02/03/23    Progress Note Due on Visit 50    PT Start Time 0806    PT Stop Time 0844    PT Time Calculation (min) 38 min    Equipment Utilized During Treatment Gait belt    Activity Tolerance Patient tolerated treatment well;No increased pain    Behavior During Therapy WFL for tasks assessed/performed                      Past Medical History:  Diagnosis Date   Allergy    Anemia    Bell's palsy    Diabetes mellitus without complication (HCC)    diet controlled   Hypertension    Hypothyroidism    Kidney stones    Pseudotumor cerebri    Stroke [facility])    Past Surgical History:  Procedure Laterality Date   COLONOSCOPY WITH PROPOFOL N/A 03/30/2020   Procedure: COLONOSCOPY WITH PROPOFOL;  Surgeon: Regis Bill, MD;  Location: [facility] ENDOSCOPY;  Service: Endoscopy;  Laterality: N/A;   LOOP RECORDER INSERTION N/A 01/27/2018   Procedure: LOOP RECORDER INSERTION;  Surgeon: Duke Salvia, MD;  Location: [facility] INVASIVE CV LAB;  Service: Cardiovascular;  Laterality: N/A;   LUMBAR PUNCTURE     as child   NO PAST SURGERIES     TEE WITHOUT CARDIOVERSION N/A 01/07/2018   Procedure: TRANSESOPHAGEAL ECHOCARDIOGRAM (TEE);  Surgeon: Antonieta Iba, MD;  Location: [facility] ORS;  Service: Cardiovascular;  Laterality: N/A;   Patient Active Problem List   Diagnosis Date Noted   Unsteady gait 10/05/2022   Hydronephrosis, left 05/04/2022   Type II diabetes mellitus with renal manifestations (HCC) 05/04/2022   Leukocytosis 05/04/2022   Pre-op evaluation 04/24/2022   Headache 07/07/2021    Hearing loss 07/07/2021   Open wound 01/25/2021   History of colon polyps 10/15/2020   Postoperative hemorrhage involving digestive system following digestive system procedure 09/19/2020   Acute cholecystitis without calculus 09/09/2020   Type 2 diabetes mellitus, with long-term current use of insulin (HCC) 09/09/2020   Cryptogenic stroke (HCC) 07/13/2020   History of loop recorder 07/13/2020   Weakness 06/18/2020   History of 2019 novel coronavirus disease (COVID-19) 05/20/2020   ESRD (end stage renal disease) (HCC) 04/04/2020   Pneumonia due to COVID-19 virus 04/03/2020   AKI (acute kidney injury) (HCC) 04/03/2020   Elevated troponin 04/03/2020   Acquired trigger finger 06/08/2019   Lymphedema 06/08/2019   Anemia 04/17/2019   Swelling of left lower extremity 01/10/2019   Facial droop 04/09/2018   Daytime somnolence 03/30/2018   Carotid artery disease (HCC) 02/03/2018   Intracranial vascular stenosis 09/12/2017   Cough 01/20/2017   Bell's palsy 11/10/2016   History of CVA (cerebrovascular accident) 11/10/2016   Benign localized hyperplasia of prostate with urinary obstruction 10/27/2016   History of nephrolithiasis 10/27/2016   TIA (transient ischemic attack) 10/20/2016   Near syncope 06/23/2016   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   Neuropathy 08/06/2015   Health care maintenance 08/06/2015   Essential hypertension 08/06/2015   Heme positive stool 10/10/2013  Hypothyroidism 10/10/2013   Microalbuminuria 10/10/2013   Hyperlipidemia 10/10/2013   B12 deficiency 10/10/2013   Diabetes (HCC) 07/04/2013   Environmental allergies 07/04/2013   ONSET DATE: 2-3 years  REFERRING DIAG: Neuropathy  THERAPY DIAG:  Muscle weakness (generalized)  Unsteadiness on feet  Difficulty in walking, not elsewhere classified  Rationale for Evaluation and Treatment Rehabilitation  SUBJECTIVE:                                                                                                                                                                                              SUBJECTIVE STATEMENT: Patient reports he is recovering from dialysis yesterday.    Pt accompanied by: self  PERTINENT HISTORY: Patient presents to physical therapy for neuropathy. He underwent banding of his L UE fistula on 05/13/22 and scheduled for additional banding on 06/07/22. 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 Patient reports his balance is very unsteady due to neuropathy in feet, feels weak in LE's.   PAIN:  No pain today, just soreness in legs  PRECAUTIONS: Fall  WEIGHT BEARING RESTRICTIONS Yes no lifting >5 lb in arm   FALLS: Has patient fallen in last 6 months? No  PLOF: Independent  PATIENT GOALS to be more steady and walk as normally as possible.   OBJECTIVE:   TODAY'S TREATMENT:   TherEx:  6" step: -eccentric heel tap 10x each LE; BUE support  12" step up: 10x with UE support ; very challenging   Seated therex:        Seated:  Heel toe raises 25x Seated heel raise squeezing ball x 20 Bil Adduction squeeze with LAQ 10x  Straight leg abduction/adduction 10x   Neuro Re-ed:  12" step: toe taps no UE support for single limb stability 10x each LE   Activity Description: one pod per stair, two on floor; step up to step and tap pod, UE support. .  Activity Setting:  The Blaze Pod Random setting was chosen to enhance cognitive processing and agility, providing an unpredictable environment to simulate real-world scenarios, and fostering quick reactions and adaptability.   Number of Pods:  6 Cycles/Sets:  2 Duration (Time or Hit Count):  10    PATIENT EDUCATION: Education details: HEP, goals, POC. Pt educated throughout session about proper posture and technique with exercises.  Improved exercise technique, movement at target joints, use of target muscles after min to mod verbal, visual, tactile cues.  Person educated: Patient Education method: Explanation, Demonstration, Tactile cues, Verbal  cues, and Handouts Education comprehension: verbalized understanding, returned demonstration, verbal cues required, and tactile cues required  HOME EXERCISE PROGRAM: Access Code: [email or web address] URL: [email or web address] Date: 06/03/2022 Prepared by: Precious Bard  Exercises - Seated Heel Toe Raises  - 1 x daily - 7 x weekly - 2 sets - 10 reps - 5 hold - Standing Tandem Balance with Counter Support  - 1 x daily - 7 x weekly - 2 sets - 2 reps - 30 hold - Standing March with Counter Support  - 1 x daily - 7 x weekly - 2 sets - 10 reps - 5 hold   GOALS: Goals reviewed with patient? Yes   SHORT TERM GOALS: Target date: 07/01/2022  Patient will be independent in home exercise program to improve strength/mobility for better functional independence with ADLs. Baseline:10/9; HEP given 2/21: HEP compliant  Goal status: MET   LONG TERM GOALS: Target date: 02/03/2023  Patient will increase FOTO score to equal to or greater than   63%  to demonstrate statistically significant improvement in mobility and quality of life.  Baseline: 10/9: 53% 11/29: 58% 12/27: 51% 1/17: 56% 2/21: 53% 3/18: 60%  4/8 58% Goal status: IN PROGRESS  2.  Patient (< 5 years old) will complete five times sit to stand test in < 10 seconds without UE support indicating an increased LE strength and improved balance. Baseline: 10/9: 19.6 seconds with walking stick; one LOB 11/29: 20.56 seconds 12/27: 13.4 seconds hands on knees 1/17: 13 seconds hands on knees  2/21: 14 seconds no hands 3/18: 12 seconds no hands. 4/8 11.3 sec UE on knees.  Goal status: IN PROGRESS  3.  Patient will increase Berg Balance score by > 45/56 to demonstrate decreased fall risk during functional activities. Baseline: 10/9:  33/56 11/29: 16/10 96/04: 54/09 8/11:  91/47 2/21: 46/56 3/18: 48/56  Goal status: MET  +4.  Patient will increase 10 meter walk test to >1.45m/s as to improve gait speed for better community ambulation and to reduce fall risk. Baseline: 11/29: 1.19 m/s Goal status: MET  5.  Patient will increase ABC scale score >80% to demonstrate better functional mobility and better confidence with ADLs.  Baseline: 10/9: 67% 11/29: 75.6% 12/27: 78%  2/21: 78% 3/18: 80% Goal status: MET  6. Patient will tolerate 5 seconds of single leg stance without loss of balance to improve ability to get in and out of shower safely. Baseline: 11/29: unable to stand on single leg safely 12/27: 2 seconds 1/17: 3 seconds 2/21: 3 seconds 3/18: 4 seconds. 4/8: 3 sec   Goal status: IN PROGRESS   6. Patient will increase six minute walk test distance to >1000 for progression to community ambulator and improve gait ability Baseline: 12/27: 845 ft with with walking stick 1/17: 725ft 2/21: 930 ft  3/18: 950 ft 4/8: 1196ft Goal status: met  7. Patient will increase dynamic gait index score to >19/24 as to demonstrate reduced fall risk and improved dynamic gait balance for better safety with community/home ambulation.  Baseline: 3/18:  9/24. 4/8: 16/24 Goal status: in Progress   ASSESSMENT:  CLINICAL IMPRESSION: Patient presents with excellent motivation. Patient's stair negotiation is improving with decreased instability this session unitizing blaze pods. He is able to tolerate increased single limb stance this session. Large step ups are challenging for strength of LLE. Pt will continue to benefit from skilled PT to make progress in BLE strength, balance, and coordination to return to PLOF and improve QoL.   OBJECTIVE  IMPAIRMENTS Abnormal gait, decreased activity tolerance, decreased balance, decreased coordination, decreased endurance, decreased mobility, difficulty walking, decreased strength, impaired flexibility, impaired  sensation, improper body mechanics, and pain.   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   REHAB POTENTIAL: Good  CLINICAL DECISION MAKING: Evolving/moderate complexity  EVALUATION COMPLEXITY: Moderate  PLAN: PT FREQUENCY: 2x/week  PT DURATION: 12 weeks  PLANNED INTERVENTIONS: Therapeutic exercises, Therapeutic activity, Neuromuscular re-education, Balance training, Gait training, Patient/Family education, Self Care, Joint mobilization, Stair training, Vestibular training, Canalith repositioning, Visual/preceptual remediation/compensation, DME instructions, Dry Needling, Cognitive remediation, Spinal mobilization, Cryotherapy, Moist heat, Manual lymph drainage, Compression bandaging, Taping, Vasopneumatic device, Ultrasound, Manual therapy, and Re-evaluation  PLAN FOR NEXT SESSION:   BLE strength, continue to progress dynamic balance and dual tasking balance. Continue to monitor BP as appropriate  Precious Bard PT ,DPT Physical Therapist- [place]  [facility]

## 2022-12-25 ENCOUNTER — Ambulatory Visit: Payer: Medicare Other | Attending: Internal Medicine

## 2022-12-25 ENCOUNTER — Ambulatory Visit: Payer: Medicare Other

## 2022-12-25 DIAGNOSIS — R278 Other lack of coordination: Secondary | ICD-10-CM | POA: Insufficient documentation

## 2022-12-25 DIAGNOSIS — R2681 Unsteadiness on feet: Secondary | ICD-10-CM

## 2022-12-25 DIAGNOSIS — R2689 Other abnormalities of gait and mobility: Secondary | ICD-10-CM | POA: Insufficient documentation

## 2022-12-25 DIAGNOSIS — R262 Difficulty in walking, not elsewhere classified: Secondary | ICD-10-CM | POA: Insufficient documentation

## 2022-12-25 DIAGNOSIS — R269 Unspecified abnormalities of gait and mobility: Secondary | ICD-10-CM | POA: Diagnosis present

## 2022-12-25 DIAGNOSIS — M6281 Muscle weakness (generalized): Secondary | ICD-10-CM | POA: Diagnosis present

## 2022-12-25 NOTE — Therapy (Signed)
OCCUPATIONAL THERAPY NEURO TREATMENT NOTE  Patient Name: Gregory Crane MRN: [id number] DOB:1971-07-25, 52 y.o., male  PCP: [name] REFERRING PROVIDER: Dale [place]   END OF SESSION:  OT End of Session - 12/25/22 0936     Visit Number 14    Number of Visits 24    Date for OT Re-Evaluation 01/13/23    OT Start Time 0845    OT Stop Time 0930    OT Time Calculation (min) 45 min    Activity Tolerance Patient tolerated treatment well    Behavior During Therapy [facility] for tasks assessed/performed            Past Medical History:  Diagnosis Date   Allergy    Anemia    Bell's palsy    Diabetes mellitus without complication (HCC)    diet controlled   Hypertension    Hypothyroidism    Kidney stones    Pseudotumor cerebri    Stroke [facility])    Past Surgical History:  Procedure Laterality Date   COLONOSCOPY WITH PROPOFOL N/A 03/30/2020   Procedure: COLONOSCOPY WITH PROPOFOL;  Surgeon: Regis Bill, MD;  Location: [facility] ENDOSCOPY;  Service: Endoscopy;  Laterality: N/A;   LOOP RECORDER INSERTION N/A 01/27/2018   Procedure: LOOP RECORDER INSERTION;  Surgeon: Duke Salvia, MD;  Location: [facility] INVASIVE CV LAB;  Service: Cardiovascular;  Laterality: N/A;   LUMBAR PUNCTURE     as child   NO PAST SURGERIES     TEE WITHOUT CARDIOVERSION N/A 01/07/2018   Procedure: TRANSESOPHAGEAL ECHOCARDIOGRAM (TEE);  Surgeon: Antonieta Iba, MD;  Location: [facility] ORS;  Service: Cardiovascular;  Laterality: N/A;   Patient Active Problem List   Diagnosis Date Noted   Unsteady gait 10/05/2022   Hydronephrosis, left 05/04/2022   Type II diabetes mellitus with renal manifestations (HCC) 05/04/2022   Leukocytosis 05/04/2022   Pre-op evaluation 04/24/2022   Headache 07/07/2021   Hearing loss 07/07/2021   Open wound 01/25/2021   History of colon polyps 10/15/2020   Postoperative hemorrhage involving digestive system following digestive system procedure 09/19/2020   Acute cholecystitis  without calculus 09/09/2020   Type 2 diabetes mellitus, with long-term current use of insulin (HCC) 09/09/2020   Cryptogenic stroke (HCC) 07/13/2020   History of loop recorder 07/13/2020   Weakness 06/18/2020   History of 2019 novel coronavirus disease (COVID-19) 05/20/2020   ESRD (end stage renal disease) (HCC) 04/04/2020   Pneumonia due to COVID-19 virus 04/03/2020   AKI (acute kidney injury) (HCC) 04/03/2020   Elevated troponin 04/03/2020   Acquired trigger finger 06/08/2019   Lymphedema 06/08/2019   Anemia 04/17/2019   Swelling of left lower extremity 01/10/2019   Facial droop 04/09/2018   Daytime somnolence 03/30/2018   Carotid artery disease (HCC) 02/03/2018   Intracranial vascular stenosis 09/12/2017   Cough 01/20/2017   Bell's palsy 11/10/2016   History of CVA (cerebrovascular accident) 11/10/2016   Benign localized hyperplasia of prostate with urinary obstruction 10/27/2016   History of nephrolithiasis 10/27/2016   TIA (transient ischemic attack) 10/20/2016   Near syncope 06/23/2016   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   Neuropathy 08/06/2015   Health care maintenance 08/06/2015   Essential hypertension 08/06/2015   Heme positive stool 10/10/2013   Hypothyroidism 10/10/2013   Microalbuminuria 10/10/2013   Hyperlipidemia 10/10/2013   B12 deficiency 10/10/2013   Diabetes (HCC) 07/04/2013   Environmental allergies 07/04/2013    ONSET DATE: May 2023  REFERRING DIAG: CVA  THERAPY DIAG:  Muscle weakness (generalized)  Other lack  of coordination  Rationale for Evaluation and Treatment: Rehabilitation  SUBJECTIVE:  SUBJECTIVE STATEMENT: Pt reports his friend's father passed away last week and pt is planning to attend his funeral to support his friend.  Erasmo Score is Friday so pt is planning a very brief trip to [place] at the end of this week and is working with his dialysis team to try and coordinate a treatment down in [place].  Pt accompanied by: self  PERTINENT  HISTORY: Patient with history of CVA, banding of fistula in left arm with worsening of LUE functional use over time since May 2023, he has been seeing PT for balance deficits.  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    PRECAUTIONS: Fall and Other: No strengthening exercises greater than 5-10 pounds in left arm.   Dialysis Tues, Th, Sat.  WEIGHT BEARING RESTRICTIONS: No  PAIN:  Are you having pain? No  FALLS: Has patient fallen in last 6 months? No  LIVING ENVIRONMENT: Lives with: alone Lives in: House/apartment Stairs: Yes: Internal: 15 steps; on right going up Has following equipment at home: Single point cane and Walker - 2 wheeled  PLOF: Independent  PATIENT GOALS: Pt reports he wants to have better use of his left hand, be able to do buttons and shoes.   OBJECTIVE:   HAND DOMINANCE: Right  ADLs: Overall ADLs: modified independent with most tasks Transfers/ambulation related to ADLs: Now walking without an assistive device, used cane in the past.  Eating: Independent with self feeding, difficulty with cutting food, requires increased focus and effort. Grooming: Independent UB Dressing: Difficulty with buttons, uses pullovers most of the time  LB Dressing: Difficulty with tying shoes Toileting: Independent Bathing: Independent Tub Shower transfers: Independent Equipment: none  IADLs: Shopping: independent,  uses a cart to push in the store Light housekeeping: modified independent Meal Prep: difficulty with tasks requiring use of left hand, bilateral hand use.   Community mobility: Ambulates without use of assistive device, able to drive Medication management: Independent Financial management: Independent Handwriting: 90% legible  MOBILITY STATUS: Needs Assist: Pt has progressed  with PT, used to use a cane but ambulating without an assistive device but still has balance difficulties.    POSTURE COMMENTS:    Sitting balance: Sits without UE support up to 30 sec  ACTIVITY TOLERANCE: Activity tolerance: limited activity tolerance  FUNCTIONAL OUTCOME MEASURES: FOTO: 53  UPPER EXTREMITY ROM:    Active ROM Right eval Left eval  Shoulder flexion [facility]  Shoulder abduction [facility]  Shoulder adduction [facility]  Shoulder extension [facility]  Shoulder internal rotation [facility]  Shoulder external rotation [facility]  Elbow flexion [facility] WFL  Elbow extension [facility]  Wrist flexion [facility] WFL  Wrist extension [facility] WFL  Wrist ulnar deviation [facility] WFL  Wrist radial deviation [facility] WFL  Wrist pronation [facility] WFL  Wrist supination WFL WFL  (Blank rows = not tested)  UPPER EXTREMITY MMT:     MMT Right eval Left eval  Shoulder flexion 5/5 3+/5  Shoulder abduction 5/5 3+/5  Shoulder adduction    Shoulder extension    Shoulder internal rotation    Shoulder external rotation    Middle trapezius    Lower trapezius    Elbow flexion 5/5 3+/5  Elbow extension 5/5 3+/5  Wrist flexion 5/5 3+/5  Wrist extension 5/5 3+/5  Wrist ulnar deviation 5/5 3+/5  Wrist radial deviation 5/5 3+/5  Wrist pronation 5/5 3+/5  Wrist supination 5/5 3+/5  (Blank rows = not tested)  Opposition of thumb to all digits on right, on left hand opposition to ring finger but cannot demonstrate to small finger.   HAND FUNCTION: Grip strength: Right: 44 lbs; Left: 35 lbs, Lateral pinch: Right: 17 lbs, Left: 16 lbs, 3 point pinch: Right: 14 lbs, Left: 0 lbs, and Tip pinch: Right 2 lbs, Left: 0 lbs Measured 12/23/2022: Grip strength: Right: 50 lbs; Left: 43 lbs, Lateral pinch: Right: 10 lbs, Left: 8 lbs, 3 point pinch: Right:  9 lbs, Left: 8 lbs, and Tip pinch: Right 8 lbs, Left: 4 lbs  COORDINATION: 9 Hole Peg test: Right: 36 sec; Left: 56 sec Measured 12/23/2022: 9 Hole Peg test: Right: 32 sec; Left: 45  sec  TODAY'S TREATMENT: 12/23/2022                                                                                                                              Therapeutic Exercises: Facilitated hand strengthening with use of hand gripper set at 17.9# for 1 trial, then increased to 23.4# for 2 more trials to remove jumbo pegs from pegboard using L hand.  Pt required initial cues to make sure L SF was engaged into each gripping rep.  Therapeutic Activity: Facilitated L hand FMC/dexterity skills working to pick up medium and small size beads (pill simulation).  Pt practiced picking up pills with and without a non-skid surface (table top and dycem).  Pt practiced pill pick up with a scooping method, tip pinch, lateral pinch, and 3 point pinch patterns, then practiced moving pills from palm to fingertips in prep to discard pills into pill container.  Pt practiced additional dexterity skills working with tweezers in L hand to pick up 1 pill at a time, requiring initial cues for position of IF on tweezers for better stability.  Dycem issued for home to ease ability to open containers and easier pill pick up from table.    PATIENT EDUCATION: Education details: benefits of dycem for pill management and opening containers Person educated: Patient Education method: Explanation Education comprehension: verbalized understanding  HOME EXERCISE PROGRAM: Will provide HEP in 1-2 visits  GOALS: Goals reviewed with patient? Yes  SHORT TERM GOALS: Target date:  11/18/2022   Pt will demonstrate ability to hold and utilize utensils for cutting meat with modified independence. Baseline: difficulty with cutting, stabilizing item with fork for cutting. Goal status: IN PROGRESS  2.  Pt will demonstrate ability to obtain keys from left pocket without difficult and without dropping.   Baseline: pt struggles with reaching into left pocket to get keys.   Goal status: IN PROGRESS   LONG TERM GOALS: Target date:  01/13/2023   Pt will demonstrate HEP with modified independence.   Baseline: no current program for left UE.  Goal status: IN PROGRESS  2.  Pt will improve left UE coordination by 10 secs on 9 hole peg test to demonstrate shoe tying with modified independence.  Baseline: currently wearing slip on shoes and unable to tie shoes, left 9 hole peg test in 56 sec at eval  Goal status: IN PROGRESS  3.  Pt will demonstrate improved coordination in left hand to demonstrate buttoning buttons on clothing with modified independence.  Baseline: difficulty with buttons and has been wearing more pullovers to avoid challenge of buttons. Goal status: IN PROGRESS  4.  Pt will demonstrate improvement in left grip strength by 5# to open jars and containers with modified independence and greater ease.   Baseline: Difficulty with opening jars and containers with use of left hand at eval.  Goal status: IN PROGRESS  5.  Pt will improve left UE strength by 1 mm grade to hold and hand wash dishes with modified independence.  Baseline: difficulty with being able to hand wash dishes at eval, difficulty stabilizing dish. Goal status: IN PROGRESS  6.  Pt will demonstrate FOTO score of 59 or greater to show a clinically relevant change in LUE to impact greater independence with daily ADL and IADL tasks at home and in the community.   Baseline:  Goal status: IN PROGRESS  ASSESSMENT: CLINICAL IMPRESSION: Pt reports his friend's father passed away last week and pt is planning to attend the funeral to support his friend.  Erasmo Score is Friday so pt is planning a very brief trip to [place] at the end of this week and is working with his dialysis team to try and coordinate a treatment down in [place].  Pt tolerated therapeutic exercises and activities well this date.  Pt tolerated up to 23.4# on the hand gripper today to remove jumbo pegs from pegboard.  Focused on L hand FMC/dexterity skills with pill pick up using a scooping  method, tip pinch, lateral pinch, and 3 point pinch patterns, then practiced moving pills from palm to fingertips in prep to discard pills into pill container.  Pt struggled with 3 point pinch pattern and translatory skills, specifically struggling to engage the LF with the IF and thumb when moving pills from palm to fingertips.  Dycem issued for home to ease ability to open containers and easier pill pick up from table.  Pt continues to benefit from skilled OT services to increase strength, coordination and hand function for necessary daily tasks.    PERFORMANCE DEFICITS: in functional skills including ADLs, IADLs, coordination, dexterity, sensation, tone, ROM, strength, flexibility, Fine motor control, balance, endurance, decreased knowledge of use of DME, and UE functional use, cognitive skills including, and psychosocial skills including environmental adaptation, habits, and routines and behaviors.   IMPAIRMENTS: are limiting patient from ADLs, IADLs, and social participation.   CO-MORBIDITIES: has co-morbidities such as CAD, ESRD with dialysis, deafness in right ear, diabetic retinopathy, HTN, neuropathy  that affects occupational performance. Patient will benefit from skilled OT to address above impairments and improve overall function.  MODIFICATION OR ASSISTANCE TO COMPLETE EVALUATION: Min-Moderate modification of tasks or assist with assess necessary to complete an evaluation.  OT OCCUPATIONAL PROFILE AND HISTORY: Comprehensive assessment: Review of records and extensive additional review of physical, cognitive, psychosocial history related to current functional performance.  CLINICAL DECISION MAKING: Moderate - several treatment options, min-mod task modification necessary  REHAB POTENTIAL: Good  EVALUATION COMPLEXITY: Moderate PLAN:  OT FREQUENCY: 2x/week  OT DURATION: 12 weeks  PLANNED INTERVENTIONS: self care/ADL training, therapeutic exercise, therapeutic activity, neuromuscular  re-education,  manual therapy, balance training, paraffin, moist heat, contrast bath, patient/family education, and DME and/or AE instructions  RECOMMENDED OTHER SERVICES: Pt is being seen by PT currently.   CONSULTED AND AGREED WITH PLAN OF CARE: Patient  PLAN FOR NEXT SESSION: Initiate HEP for grip, pinch and coordination skills.   Danelle Earthly, MS, OTR/L  [employer], OT 12/25/2022, 9:43 AM

## 2022-12-26 ENCOUNTER — Ambulatory Visit: Payer: Medicare Other

## 2022-12-30 ENCOUNTER — Ambulatory Visit: Payer: Medicare Other | Admitting: Physical Therapy

## 2022-12-30 ENCOUNTER — Ambulatory Visit: Payer: Medicare Other | Admitting: Occupational Therapy

## 2022-12-30 DIAGNOSIS — R2681 Unsteadiness on feet: Secondary | ICD-10-CM

## 2022-12-30 DIAGNOSIS — R278 Other lack of coordination: Secondary | ICD-10-CM

## 2022-12-30 DIAGNOSIS — M6281 Muscle weakness (generalized): Secondary | ICD-10-CM

## 2022-12-30 DIAGNOSIS — R262 Difficulty in walking, not elsewhere classified: Secondary | ICD-10-CM

## 2022-12-30 DIAGNOSIS — R269 Unspecified abnormalities of gait and mobility: Secondary | ICD-10-CM

## 2022-12-30 DIAGNOSIS — R2689 Other abnormalities of gait and mobility: Secondary | ICD-10-CM

## 2022-12-30 NOTE — Therapy (Signed)
OCCUPATIONAL THERAPY NEURO TREATMENT NOTE  Patient Name: Gregory Crane MRN: [id number] DOB:[birth year], 52 y.o., male  PCP: [place] REFERRING PROVIDER: Dale [place]   END OF SESSION:  OT End of Session - 12/30/22 0846     Visit Number 15    Number of Visits 24    Date for OT Re-Evaluation 01/13/23    OT Start Time 0845    OT Stop Time 0930    OT Time Calculation (min) 45 min    Activity Tolerance Patient tolerated treatment well    Behavior During Therapy [facility] for tasks assessed/performed            Past Medical History:  Diagnosis Date   Allergy    Anemia    Bell's palsy    Diabetes mellitus without complication (HCC)    diet controlled   Hypertension    Hypothyroidism    Kidney stones    Pseudotumor cerebri    Stroke [facility])    Past Surgical History:  Procedure Laterality Date   COLONOSCOPY WITH PROPOFOL N/A 03/30/2020   Procedure: COLONOSCOPY WITH PROPOFOL;  Surgeon: Regis Bill, MD;  Location: [facility] ENDOSCOPY;  Service: Endoscopy;  Laterality: N/A;   LOOP RECORDER INSERTION N/A 01/27/2018   Procedure: LOOP RECORDER INSERTION;  Surgeon: Duke Salvia, MD;  Location: [facility] INVASIVE CV LAB;  Service: Cardiovascular;  Laterality: N/A;   LUMBAR PUNCTURE     as child   NO PAST SURGERIES     TEE WITHOUT CARDIOVERSION N/A 01/07/2018   Procedure: TRANSESOPHAGEAL ECHOCARDIOGRAM (TEE);  Surgeon: Antonieta Iba, MD;  Location: [facility] ORS;  Service: Cardiovascular;  Laterality: N/A;   Patient Active Problem List   Diagnosis Date Noted   Unsteady gait 10/05/2022   Hydronephrosis, left 05/04/2022   Type II diabetes mellitus with renal manifestations (HCC) 05/04/2022   Leukocytosis 05/04/2022   Pre-op evaluation 04/24/2022   Headache 07/07/2021   Hearing loss 07/07/2021   Open wound 01/25/2021   History of colon polyps 10/15/2020   Postoperative hemorrhage involving digestive system following digestive system procedure 09/19/2020   Acute cholecystitis  without calculus 09/09/2020   Type 2 diabetes mellitus, with long-term current use of insulin (HCC) 09/09/2020   Cryptogenic stroke (HCC) 07/13/2020   History of loop recorder 07/13/2020   Weakness 06/18/2020   History of 2019 novel coronavirus disease (COVID-19) 05/20/2020   ESRD (end stage renal disease) (HCC) 04/04/2020   Pneumonia due to COVID-19 virus 04/03/2020   AKI (acute kidney injury) (HCC) 04/03/2020   Elevated troponin 04/03/2020   Acquired trigger finger 06/08/2019   Lymphedema 06/08/2019   Anemia 04/17/2019   Swelling of left lower extremity 01/10/2019   Facial droop 04/09/2018   Daytime somnolence 03/30/2018   Carotid artery disease (HCC) 02/03/2018   Intracranial vascular stenosis 09/12/2017   Cough 01/20/2017   Bell's palsy 11/10/2016   History of CVA (cerebrovascular accident) 11/10/2016   Benign localized hyperplasia of prostate with urinary obstruction 10/27/2016   History of nephrolithiasis 10/27/2016   TIA (transient ischemic attack) 10/20/2016   Near syncope 06/23/2016   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 Reports having travelled to [place] over the weekend to attend a funeral. 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    PRECAUTIONS: Fall and Other: No strengthening exercises greater than 5-10 pounds in left arm.   Dialysis Tues, Th, Sat.  WEIGHT BEARING RESTRICTIONS: No  PAIN:  Are you having pain? No  FALLS: Has patient fallen in last 6 months? No  LIVING ENVIRONMENT: Lives with: alone Lives in: House/apartment Stairs: Yes: Internal: 15 steps; on right going up Has following equipment at home: Single point cane and Walker - 2 wheeled  PLOF: Independent  PATIENT GOALS: Pt reports he wants to have better use of his left hand, be able to do buttons and shoes.   OBJECTIVE:   HAND DOMINANCE: Right  ADLs: Overall ADLs: modified independent with most tasks Transfers/ambulation related to ADLs: Now walking without an assistive device, used cane in the past.  Eating: Independent with self feeding, difficulty with cutting food, requires increased focus and effort. Grooming: Independent UB Dressing: Difficulty with buttons, uses pullovers most of the time  LB Dressing: Difficulty with tying shoes Toileting: Independent Bathing: Independent Tub Shower transfers: Independent Equipment: none  IADLs: Shopping: independent,  uses a cart to push in the store Light housekeeping: modified independent Meal Prep: difficulty with tasks requiring use of left hand, bilateral hand use.   Community mobility: Ambulates without use of assistive device, able to drive Medication management: Independent Financial management: Independent Handwriting: 90% legible  MOBILITY STATUS: Needs Assist: Pt has progressed with PT, used to use a cane but ambulating without an assistive device but still has balance difficulties.    POSTURE COMMENTS:    Sitting balance: Sits without UE support up to 30 sec  ACTIVITY TOLERANCE: Activity  tolerance: limited activity tolerance  FUNCTIONAL OUTCOME MEASURES: FOTO: 53  UPPER EXTREMITY ROM:    Active ROM Right eval Left eval  Shoulder flexion [facility]  Shoulder abduction [facility]  Shoulder adduction [facility]  Shoulder extension [facility]  Shoulder internal rotation [facility]  Shoulder external rotation [facility]  Elbow flexion [facility]FL  Elbow extension [facility]  Wrist flexion [facility] WFL  Wrist extension [facility] WFL  Wrist ulnar deviation WFL WFL  Wrist radial deviation WFL WFL  Wrist pronation WFL WFL  Wrist supination WFL WFL  (Blank rows = not tested)  UPPER EXTREMITY MMT:     MMT Right eval Left eval  Shoulder flexion 5/5 3+/5  Shoulder abduction 5/5 3+/5  Shoulder adduction    Shoulder extension    Shoulder internal rotation    Shoulder external rotation    Middle trapezius    Lower trapezius    Elbow flexion 5/5 3+/5  Elbow extension 5/5 3+/5  Wrist flexion 5/5 3+/5  Wrist extension 5/5 3+/5  Wrist ulnar deviation 5/5 3+/5  Wrist radial deviation 5/5 3+/5  Wrist pronation 5/5 3+/5  Wrist supination 5/5 3+/5  (Blank rows = not tested)  Opposition of thumb to all digits on right, on left hand opposition to ring finger but  cannot demonstrate to small finger.   HAND FUNCTION: Grip strength: Right: 44 lbs; Left: 35 lbs, Lateral pinch: Right: 17 lbs, Left: 16 lbs, 3 point pinch: Right: 14 lbs, Left: 0 lbs, and Tip pinch: Right 2 lbs, Left: 0 lbs Measured 12/23/2022: Grip strength: Right: 50 lbs; Left: 43 lbs, Lateral pinch: Right: 10 lbs, Left: 8 lbs, 3 point pinch: Right:  9 lbs, Left: 8 lbs, and Tip pinch: Right 8 lbs, Left: 4 lbs  COORDINATION: 9 Hole Peg test: Right: 36 sec; Left: 56 sec Measured 12/23/2022: 9 Hole Peg test: Right: 32 sec; Left: 45 sec  TODAY'S TREATMENT: 12/30/2022                                                                                                                              Therapeutic Exercises:  Facilitated hand strengthening  with use of hand gripper set at 23.4# for 2 trials gripping jumbo pegs. Pt. Worked on sustaining left grip while reaching up to place them into a container.    Therapeutic Activity:  Pt. worked on left [facility] skills grasping small 1/2" pegs, storing them in his hand, and working on moving them through his hand from the palm to the tip of the 2nd digit, and thumb in preparation for placing them into a vertical pegboard. Pt. Worked on removing the small objects from the pegboard while alternating thumb opposition to the tip of the 2nd through 4th digits.      PATIENT EDUCATION: Education details: benefits of dycem for pill management and opening containers Person educated: Patient Education method: Explanation Education comprehension: verbalized understanding  HOME EXERCISE PROGRAM: Will provide HEP in 1-2 visits  GOALS: Goals reviewed with patient? Yes  SHORT TERM GOALS: Target date:  11/18/2022   Pt will demonstrate ability to hold and utilize utensils for cutting meat with modified independence. Baseline: difficulty with cutting, stabilizing item with fork for cutting. Goal status: IN PROGRESS  2.  Pt will demonstrate ability to obtain keys from left pocket without difficult and without dropping.   Baseline: pt struggles with reaching into left pocket to get keys.   Goal status: IN PROGRESS   LONG TERM GOALS: Target date: 01/13/2023   Pt will demonstrate HEP with modified independence.   Baseline: no current program for left UE.  Goal status: IN PROGRESS  2.  Pt will improve left UE coordination by 10 secs on 9 hole peg test to demonstrate shoe tying with modified independence.  Baseline: currently wearing slip on shoes and unable to tie shoes, left 9 hole peg test in 56 sec at eval  Goal status: IN PROGRESS  3.  Pt will demonstrate improved coordination in left hand to demonstrate buttoning buttons on clothing with modified independence.  Baseline: difficulty with buttons and  has been wearing more pullovers to avoid challenge of buttons. Goal status: IN PROGRESS  4.  Pt will demonstrate improvement in left grip strength by 5# to  open jars and containers with modified independence and greater ease.   Baseline: Difficulty with opening jars and containers with use of left hand at eval.  Goal status: IN PROGRESS  5.  Pt will improve left UE strength by 1 mm grade to hold and hand wash dishes with modified independence.  Baseline: difficulty with being able to hand wash dishes at eval, difficulty stabilizing dish. Goal status: IN PROGRESS  6.  Pt will demonstrate FOTO score of 59 or greater to show a clinically relevant change in LUE to impact greater independence with daily ADL and IADL tasks at home and in the community.   Baseline:  Goal status: IN PROGRESS  ASSESSMENT: CLINICAL IMPRESSION:  Pt. reports that he got back from [place] yesterday morning after attending a funeral.  Pt. Was able to receive Dialysis while in [place]. Pt tolerated therapeutic exercises and [facility] activities well this date.  Pt tolerated up to 23.4# on the hand gripper x's 2 trials today.   Focused on L hand [facility]/dexterity skills with emphasis placed on  grasping small objects, and performing translatory movements moving items through the hand form the palm to the tip of the 2nd digit, and thumb in preparation for discarding them from the hand. Pt. Presented with difficulty performing translatory movements, however was able to store the objects in the ulnar aspect of the hand while manipulating them with his 2nd digit, and thumb. Pt. Was able to grasp, and remove the pegs using thumb opposition to the tip of the 2nd through 4th digits. Pt continues to benefit from skilled OT services to increase strength, coordination and hand function for necessary daily tasks.    PERFORMANCE DEFICITS: in functional skills including ADLs, IADLs, coordination, dexterity, sensation, tone, ROM, strength, flexibility,  Fine motor control, balance, endurance, decreased knowledge of use of DME, and UE functional use, cognitive skills including, and psychosocial skills including environmental adaptation, habits, and routines and behaviors.   IMPAIRMENTS: are limiting patient from ADLs, IADLs, and social participation.   CO-MORBIDITIES: has co-morbidities such as CAD, ESRD with dialysis, deafness in right ear, diabetic retinopathy, HTN, neuropathy  that affects occupational performance. Patient will benefit from skilled OT to address above impairments and improve overall function.  MODIFICATION OR ASSISTANCE TO COMPLETE EVALUATION: Min-Moderate modification of tasks or assist with assess necessary to complete an evaluation.  OT OCCUPATIONAL PROFILE AND HISTORY: Comprehensive assessment: Review of records and extensive additional review of physical, cognitive, psychosocial history related to current functional performance.  CLINICAL DECISION MAKING: Moderate - several treatment options, min-mod task modification necessary  REHAB POTENTIAL: Good  EVALUATION COMPLEXITY: Moderate PLAN:  OT FREQUENCY: 2x/week  OT DURATION: 12 weeks  PLANNED INTERVENTIONS: self care/ADL training, therapeutic exercise, therapeutic activity, neuromuscular re-education, manual therapy, balance training, paraffin, moist heat, contrast bath, patient/family education, and DME and/or AE instructions  RECOMMENDED OTHER SERVICES: Pt is being seen by PT currently.   CONSULTED AND AGREED WITH PLAN OF CARE: Patient  PLAN FOR NEXT SESSION: Initiate HEP for grip, pinch and coordination skills.   Olegario Messier, MS, OTR/L

## 2022-12-30 NOTE — Therapy (Signed)
OUTPATIENT PHYSICAL THERAPY NEURO TREATMENT     Patient Name: Gregory Crane MRN: [id number] DOB:1970-10-30, 52 y.o., male Today's Date: 12/30/2022  PCP: Dale Lake Tapawingo MD REFERRING PROVIDER: Dale Rockwall MD  PT End of Session - 12/30/22 0758     Visit Number 48    Number of Visits 60    Date for PT Re-Evaluation 02/03/23    Authorization Type Medicare    Authorization Time Period 11/11/22-02/03/23    Progress Note Due on Visit 50    PT Start Time 0800    PT Stop Time 0844    PT Time Calculation (min) 44 min    Equipment Utilized During Treatment Gait belt    Activity Tolerance Patient tolerated treatment well;No increased pain    Behavior During Therapy WFL for tasks assessed/performed                      Past Medical History:  Diagnosis Date   Allergy    Anemia    Bell's palsy    Diabetes mellitus without complication (HCC)    diet controlled   Hypertension    Hypothyroidism    Kidney stones    Pseudotumor cerebri    Stroke [facility])    Past Surgical History:  Procedure Laterality Date   COLONOSCOPY WITH PROPOFOL N/A 03/30/2020   Procedure: COLONOSCOPY WITH PROPOFOL;  Surgeon: Regis Bill, MD;  Location: [facility] ENDOSCOPY;  Service: Endoscopy;  Laterality: N/A;   LOOP RECORDER INSERTION N/A 01/27/2018   Procedure: LOOP RECORDER INSERTION;  Surgeon: Duke Salvia, MD;  Location: [facility] INVASIVE CV LAB;  Service: Cardiovascular;  Laterality: N/A;   LUMBAR PUNCTURE     as child   NO PAST SURGERIES     TEE WITHOUT CARDIOVERSION N/A 01/07/2018   Procedure: TRANSESOPHAGEAL ECHOCARDIOGRAM (TEE);  Surgeon: Antonieta Iba, MD;  Location: [facility] ORS;  Service: Cardiovascular;  Laterality: N/A;   Patient Active Problem List   Diagnosis Date Noted   Unsteady gait 10/05/2022   Hydronephrosis, left 05/04/2022   Type II diabetes mellitus with renal manifestations (HCC) 05/04/2022   Leukocytosis 05/04/2022   Pre-op evaluation 04/24/2022   Headache 07/07/2021    Hearing loss 07/07/2021   Open wound 01/25/2021   History of colon polyps 10/15/2020   Postoperative hemorrhage involving digestive system following digestive system procedure 09/19/2020   Acute cholecystitis without calculus 09/09/2020   Type 2 diabetes mellitus, with long-term current use of insulin (HCC) 09/09/2020   Cryptogenic stroke (HCC) 07/13/2020   History of loop recorder 07/13/2020   Weakness 06/18/2020   History of 2019 novel coronavirus disease (COVID-19) 05/20/2020   ESRD (end stage renal disease) (HCC) 04/04/2020   Pneumonia due to COVID-19 virus 04/03/2020   AKI (acute kidney injury) (HCC) 04/03/2020   Elevated troponin 04/03/2020   Acquired trigger finger 06/08/2019   Lymphedema 06/08/2019   Anemia 04/17/2019   Swelling of left lower extremity 01/10/2019   Facial droop 04/09/2018   Daytime somnolence 03/30/2018   Carotid artery disease (HCC) 02/03/2018   Intracranial vascular stenosis 09/12/2017   Cough 01/20/2017   Bell's palsy 11/10/2016   History of CVA (cerebrovascular accident) 11/10/2016   Benign localized hyperplasia of prostate with urinary obstruction 10/27/2016   History of nephrolithiasis 10/27/2016   TIA (transient ischemic attack) 10/20/2016   Near syncope 06/23/2016   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   Neuropathy 08/06/2015   Health care maintenance 08/06/2015   Essential hypertension 08/06/2015   Heme positive stool 10/10/2013  Hypothyroidism 10/10/2013   Microalbuminuria 10/10/2013   Hyperlipidemia 10/10/2013   B12 deficiency 10/10/2013   Diabetes (HCC) 07/04/2013   Environmental allergies 07/04/2013   ONSET DATE: 2-3 years  REFERRING DIAG: Neuropathy  THERAPY DIAG:  Muscle weakness (generalized)  Other lack of coordination  Unsteadiness on feet  Difficulty in walking, not elsewhere classified  Other abnormalities of gait and mobility  Abnormality of gait and mobility  Rationale for Evaluation and Treatment  Rehabilitation  SUBJECTIVE:                                                                                                                                                                                             SUBJECTIVE STATEMENT: Patient reports he is doing okay. Reports that he is recovering from a trip down to [place] over the weekend. Reports a lot of walking resulting in some residual soreness.    Pt accompanied by: self  PERTINENT HISTORY: Patient presents to physical therapy for neuropathy. He underwent banding of his L UE fistula on 05/13/22 and scheduled for additional banding on 06/07/22. 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 Patient reports his balance is very unsteady due to neuropathy in feet, feels weak in LE's.   PAIN:  No pain today, just soreness in legs  PRECAUTIONS: Fall  WEIGHT BEARING RESTRICTIONS Yes no lifting >5 lb in arm   FALLS: Has patient fallen in last 6 months? No  PLOF: Independent  PATIENT GOALS to be more steady and walk as normally as possible.   OBJECTIVE:   TODAY'S TREATMENT:    Nustep reciprocal endurance training BUE/BLE, cues for neutral hip placement Bil intermittently.   Level 3 x 1.5 min  Level 4 x 2 min  Level 3 x 1 min  Level 2 x 1 min  Level 1 x 1 min cool down.   Standing on airex pad: [place] BOS eyes open 2 x 1  Narrow BOS eeys open 2 x 30sec Normal BOS eyes closes 2 x 30 sec cross body reach to move magnets on white board x 20 Normal BOS, x 10 narrow BOS; perform 1 set with BUE.  Foot tap on 12 inch step x 4 BLE  Tap 1 of 3 blaze pod random setting on 8" x 1 use of BLE Tap 1 of 2  blaze pod random setting 30sec x RLE and 30 sec x LLE.   Pt required intermittently UE support on rail while performing Single limb tasks as well as  min assist to prevent LOB. Cues for use ankle ankle strategy to correct LOB with mild improvement and anterior weight shift.    PATIENT EDUCATION: Education details: HEP, goals, POC. Pt educated throughout session about proper posture and technique with exercises. Improved exercise technique, movement at target joints, use of target muscles after min to mod verbal, visual, tactile cues.  Person educated: Patient Education method: Explanation, Demonstration, Tactile cues, Verbal cues, and Handouts Education comprehension: verbalized understanding, returned demonstration, verbal cues required, and tactile cues required  HOME EXERCISE PROGRAM: Access Code: [email or web address] URL: [email or web address] Date: 06/03/2022 Prepared by: Precious Bard  Exercises - Seated Heel Toe Raises  - 1 x daily - 7 x weekly - 2 sets - 10 reps - 5 hold - Standing Tandem Balance with Counter Support  - 1 x daily - 7 x weekly - 2 sets - 2 reps - 30 hold - Standing March with Counter Support  - 1 x daily - 7 x weekly - 2 sets - 10 reps - 5 hold   GOALS: Goals reviewed with patient? Yes   SHORT TERM GOALS: Target date: 07/01/2022  Patient will be independent in home exercise program to improve strength/mobility for better functional independence with ADLs. Baseline:10/9; HEP given 2/21: HEP compliant  Goal status: MET   LONG TERM GOALS: Target date: 02/03/2023  Patient will increase FOTO score to equal to or greater than   63%  to demonstrate statistically significant improvement in mobility and quality of life.  Baseline: 10/9: 53% 11/29: 58% 12/27: 51% 1/17: 56% 2/21: 53% 3/18: 60%  4/8 58% Goal status: IN PROGRESS  2.  Patient (< 71 years old) will complete five times sit to stand test in < 10 seconds without UE support indicating an increased LE strength and improved balance. Baseline: 10/9: 19.6 seconds with walking stick; one LOB 11/29: 20.56 seconds 12/27: 13.4 seconds hands on knees 1/17: 13  seconds hands on knees  2/21: 14 seconds no hands 3/18: 12 seconds no hands. 4/8 11.3 sec UE on knees.  Goal status: IN PROGRESS  3.  Patient will increase Berg Balance score by > 45/56 to demonstrate decreased fall risk during functional activities. Baseline: 10/9: 33/56 11/29: 09/81 19/14: 78/29 5/62:  13/08 2/21: 46/56 3/18: 48/56  Goal status: MET  +4.  Patient will increase 10 meter walk test to >1.63m/s as to improve gait speed for better community ambulation and to reduce fall risk. Baseline: 11/29: 1.19 m/s Goal status: MET  5.  Patient will increase ABC scale score >80% to demonstrate better functional mobility and better confidence with ADLs.  Baseline: 10/9: 67% 11/29: 75.6% 12/27: 78%  2/21: 78% 3/18: 80% Goal status: MET  6. Patient will tolerate 5 seconds of single leg stance without loss of balance to improve ability to get in and out of shower safely. Baseline: 11/29: unable to stand on single leg safely 12/27: 2 seconds 1/17: 3 seconds 2/21: 3 seconds 3/18: 4 seconds. 4/8: 3 sec   Goal status: IN PROGRESS   6. Patient will increase six minute walk test distance to >1000 for progression to community ambulator and improve gait ability Baseline: 12/27: 845 ft with with walking stick 1/17: 762ft 2/21: 930 ft  3/18: 950 ft 4/8: 1135ft Goal status: met  7. Patient will increase dynamic gait index score to >19/24 as to demonstrate reduced fall risk and improved dynamic gait balance for better safety with community/home ambulation.  Baseline: 3/18:  9/24. 4/8: 16/24 Goal status: in  Progress   ASSESSMENT:  CLINICAL IMPRESSION: Patient presents with excellent motivation. PT treatment focused on dynamic balance training on this day. Noted to have decreased use of balance strategies to correct LOB when standing on LLE requiring min assist to prevent posterior/L LOB standing on unstable surface as well as cues for improved weight shift prior to performing single limb task. Pt will  continue to benefit from skilled PT to make progress in BLE strength, balance, and coordination to return to PLOF and improve QoL.   OBJECTIVE IMPAIRMENTS Abnormal gait, decreased activity tolerance, decreased balance, decreased coordination, decreased endurance, decreased mobility, difficulty walking, decreased strength, impaired flexibility, impaired sensation, improper body mechanics, and pain.   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   REHAB POTENTIAL: Good  CLINICAL DECISION MAKING: Evolving/moderate complexity  EVALUATION COMPLEXITY: Moderate  PLAN: PT FREQUENCY: 2x/week  PT DURATION: 12 weeks  PLANNED INTERVENTIONS: Therapeutic exercises, Therapeutic activity, Neuromuscular re-education, Balance training, Gait training, Patient/Family education, Self Care, Joint mobilization, Stair training, Vestibular training, Canalith repositioning, Visual/preceptual remediation/compensation, DME instructions, Dry Needling, Cognitive remediation, Spinal mobilization, Cryotherapy, Moist heat, Manual lymph drainage, Compression bandaging, Taping, Vasopneumatic device, Ultrasound, Manual therapy, and Re-evaluation  PLAN FOR NEXT SESSION:   BLE strength, continue to progress  dynamic balance and dual tasking balance. Continue to monitor BP as appropriate  Golden Pop PT ,DPT Physical Therapist- [place]  [facility]

## 2022-12-31 ENCOUNTER — Ambulatory Visit: Payer: Medicare Other

## 2022-12-31 NOTE — Therapy (Signed)
OUTPATIENT PHYSICAL THERAPY NEURO TREATMENT     Patient Name: Gregory Crane MRN: [id number] DOB:[birth year], 52 y.o., male Today's Date: 01/01/2023  PCP: Dale Claiborne MD REFERRING PROVIDER: Dale Loma MD  PT End of Session - 01/01/23 0844     Visit Number 49    Number of Visits 60    Date for PT Re-Evaluation 02/03/23    Authorization Type Medicare    Authorization Time Period 11/11/22-02/03/23    Progress Note Due on Visit 50    PT Start Time 0845    PT Stop Time 0928    PT Time Calculation (min) 43 min    Equipment Utilized During Treatment Gait belt    Activity Tolerance Patient tolerated treatment well;No increased pain    Behavior During Therapy WFL for tasks assessed/performed                       Past Medical History:  Diagnosis Date   Allergy    Anemia    Bell's palsy    Diabetes mellitus without complication (HCC)    diet controlled   Hypertension    Hypothyroidism    Kidney stones    Pseudotumor cerebri    Stroke [facility])    Past Surgical History:  Procedure Laterality Date   COLONOSCOPY WITH PROPOFOL N/A 03/30/2020   Procedure: COLONOSCOPY WITH PROPOFOL;  Surgeon: Regis Bill, MD;  Location: [facility] ENDOSCOPY;  Service: Endoscopy;  Laterality: N/A;   LOOP RECORDER INSERTION N/A 01/27/2018   Procedure: LOOP RECORDER INSERTION;  Surgeon: Duke Salvia, MD;  Location: [facility] INVASIVE CV LAB;  Service: Cardiovascular;  Laterality: N/A;   LUMBAR PUNCTURE     as child   NO PAST SURGERIES     TEE WITHOUT CARDIOVERSION N/A 01/07/2018   Procedure: TRANSESOPHAGEAL ECHOCARDIOGRAM (TEE);  Surgeon: Antonieta Iba, MD;  Location: [facility] ORS;  Service: Cardiovascular;  Laterality: N/A;   Patient Active Problem List   Diagnosis Date Noted   Unsteady gait 10/05/2022   Hydronephrosis, left 05/04/2022   Type II diabetes mellitus with renal manifestations (HCC) 05/04/2022   Leukocytosis 05/04/2022   Pre-op evaluation 04/24/2022   Headache  07/07/2021   Hearing loss 07/07/2021   Open wound 01/25/2021   History of colon polyps 10/15/2020   Postoperative hemorrhage involving digestive system following digestive system procedure 09/19/2020   Acute cholecystitis without calculus 09/09/2020   Type 2 diabetes mellitus, with long-term current use of insulin (HCC) 09/09/2020   Cryptogenic stroke (HCC) 07/13/2020   History of loop recorder 07/13/2020   Weakness 06/18/2020   History of 2019 novel coronavirus disease (COVID-19) 05/20/2020   ESRD (end stage renal disease) (HCC) 04/04/2020   Pneumonia due to COVID-19 virus 04/03/2020   AKI (acute kidney injury) (HCC) 04/03/2020   Elevated troponin 04/03/2020   Acquired trigger finger 06/08/2019   Lymphedema 06/08/2019   Anemia 04/17/2019   Swelling of left lower extremity 01/10/2019   Facial droop 04/09/2018   Daytime somnolence 03/30/2018   Carotid artery disease (HCC) 02/03/2018   Intracranial vascular stenosis 09/12/2017   Cough 01/20/2017   Bell's palsy 11/10/2016   History of CVA (cerebrovascular accident) 11/10/2016   Benign localized hyperplasia of prostate with urinary obstruction 10/27/2016   History of nephrolithiasis 10/27/2016   TIA (transient ischemic attack) 10/20/2016   Near syncope 06/23/2016   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   Neuropathy 08/06/2015   Health care maintenance 08/06/2015   Essential hypertension 08/06/2015   Heme positive stool 10/10/2013  Hypothyroidism 10/10/2013   Microalbuminuria 10/10/2013   Hyperlipidemia 10/10/2013   B12 deficiency 10/10/2013   Diabetes (HCC) 07/04/2013   Environmental allergies 07/04/2013   ONSET DATE: 2-3 years  REFERRING DIAG: Neuropathy  THERAPY DIAG:  Muscle weakness (generalized)  Unsteadiness on feet  Difficulty in walking, not elsewhere classified  Rationale for Evaluation and Treatment Rehabilitation  SUBJECTIVE:                                                                                                                                                                                              SUBJECTIVE STATEMENT: Patients reports no new changes since last session. No falls or LOB since last session.    Pt accompanied by: self  PERTINENT HISTORY: Patient presents to physical therapy for neuropathy. He underwent banding of his L UE fistula on 05/13/22 and scheduled for additional banding on 06/07/22. 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 Patient reports his balance is very unsteady due to neuropathy in feet, feels weak in LE's.   PAIN:  No pain today, just soreness in legs  PRECAUTIONS: Fall  WEIGHT BEARING RESTRICTIONS Yes no lifting >5 lb in arm   FALLS: Has patient fallen in last 6 months? No  PLOF: Independent  PATIENT GOALS to be more steady and walk as normally as possible.   OBJECTIVE:   TODAY'S TREATMENT:   TherEx Nustep reciprocal endurance training BUE/BLE, cues for neutral hip placement Bil intermittently.   Level 3 x 1 minute Level 5 x 2 min  Level 3 x 1 min  Level 2 x 2 min   Heel raise 20x  10x STS  Neuro Re-ed Airex balance beam:  -lateral step 6x length of // bars -static stand 60 seconds no UE support   SLS 30 seconds each LE; 2 sets with occasional cues for hand touch   Bosu ball:  -modified forward lunge 12x each LE -lateral modified lunge 12x each LE   Three way hedgehog taps 10x each side  Pt required intermittently UE support on rail while performing Single limb tasks as well as min assist to prevent LOB. Cues for use ankle ankle strategy to correct LOB with mild improvement and anterior weight shift.    PATIENT EDUCATION: Education details: HEP, goals, POC. Pt educated throughout session about proper posture and technique with exercises.  Improved exercise technique, movement at target joints, use of target muscles after min to mod verbal, visual, tactile cues.  Person educated: Patient Education method:  Explanation, Demonstration, Tactile cues, Verbal cues, and Handouts Education comprehension: verbalized understanding, returned demonstration, verbal cues required, and tactile cues required  HOME EXERCISE PROGRAM: Access Code: [email or web address] URL: [email or web address] Date: 06/03/2022 Prepared by: Precious Bard  Exercises - Seated Heel Toe Raises  - 1 x daily - 7 x weekly - 2 sets - 10 reps - 5 hold - Standing Tandem Balance with Counter Support  - 1 x daily - 7 x weekly - 2 sets - 2 reps - 30 hold - Standing March with Counter Support  - 1 x daily - 7 x weekly - 2 sets - 10 reps - 5 hold   GOALS: Goals reviewed with patient? Yes   SHORT TERM GOALS: Target date: 07/01/2022  Patient will be independent in home exercise program to improve strength/mobility for better functional independence with ADLs. Baseline:10/9; HEP given 2/21: HEP compliant  Goal status: MET   LONG TERM GOALS: Target date: 02/03/2023  Patient will increase FOTO score to equal to or greater than   63%  to demonstrate statistically significant improvement in mobility and quality of life.  Baseline: 10/9: 53% 11/29: 58% 12/27: 51% 1/17: 56% 2/21: 53% 3/18: 60%  4/8 58% Goal status: IN PROGRESS  2.  Patient (< 71 years old) will complete five times sit to stand test in < 10 seconds without UE support indicating an increased LE strength and improved balance. Baseline: 10/9: 19.6 seconds with walking stick; one LOB 11/29: 20.56 seconds 12/27: 13.4 seconds hands on knees 1/17: 13 seconds hands on knees  2/21: 14 seconds no hands 3/18: 12 seconds no hands. 4/8 11.3 sec UE on knees.  Goal status: IN PROGRESS  3.  Patient will increase Berg Balance score by > 45/56 to demonstrate decreased fall risk during functional activities. Baseline: 10/9:  33/56 11/29: 16/10 96/04: 54/09 8/11:  91/47 2/21: 46/56 3/18: 48/56  Goal status: MET  +4.  Patient will increase 10 meter walk test to >1.33m/s as to improve gait speed for better community ambulation and to reduce fall risk. Baseline: 11/29: 1.19 m/s Goal status: MET  5.  Patient will increase ABC scale score >80% to demonstrate better functional mobility and better confidence with ADLs.  Baseline: 10/9: 67% 11/29: 75.6% 12/27: 78%  2/21: 78% 3/18: 80% Goal status: MET  6. Patient will tolerate 5 seconds of single leg stance without loss of balance to improve ability to get in and out of shower safely. Baseline: 11/29: unable to stand on single leg safely 12/27: 2 seconds 1/17: 3 seconds 2/21: 3 seconds 3/18: 4 seconds. 4/8: 3 sec   Goal status: IN PROGRESS   6. Patient will increase six minute walk test distance to >1000 for progression to community ambulator and improve gait ability Baseline: 12/27: 845 ft with with walking stick 1/17: 717ft 2/21: 930 ft  3/18: 950 ft 4/8: 1166ft Goal status: met  7. Patient will increase dynamic gait index score to >19/24 as to demonstrate reduced fall risk and improved dynamic gait balance for better safety with community/home ambulation.  Baseline: 3/18:  9/24. 4/8: 16/24 Goal status: in Progress   ASSESSMENT:  CLINICAL IMPRESSION: Patient presents with good motivation. He requests a focus on stability exercises today. Ankle righting reactions are improving at this time. Patient fatigues with single limb stability. Pt will continue to benefit from skilled PT to make progress in BLE strength, balance, and coordination to return to PLOF and improve QoL.   OBJECTIVE IMPAIRMENTS Abnormal gait, decreased activity tolerance,  decreased balance, decreased coordination, decreased endurance, decreased mobility, difficulty walking, decreased strength, impaired flexibility, impaired sensation, improper body mechanics, and pain.   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   REHAB POTENTIAL: Good  CLINICAL DECISION MAKING: Evolving/moderate complexity  EVALUATION COMPLEXITY: Moderate  PLAN: PT FREQUENCY: 2x/week  PT DURATION: 12 weeks  PLANNED INTERVENTIONS: Therapeutic exercises, Therapeutic activity, Neuromuscular re-education, Balance training, Gait training, Patient/Family education, Self Care, Joint mobilization, Stair training, Vestibular training, Canalith repositioning, Visual/preceptual remediation/compensation, DME instructions, Dry Needling, Cognitive remediation, Spinal mobilization, Cryotherapy, Moist heat, Manual lymph drainage, Compression bandaging, Taping, Vasopneumatic device, Ultrasound, Manual therapy, and Re-evaluation  PLAN FOR NEXT SESSION:   BLE strength, continue to progress dynamic balance and dual tasking balance. Continue to monitor BP as appropriate  Precious Bard PT ,DPT Physical Therapist- [place]  [facility]

## 2023-01-01 ENCOUNTER — Ambulatory Visit: Payer: Medicare Other | Admitting: Occupational Therapy

## 2023-01-01 ENCOUNTER — Ambulatory Visit: Payer: Medicare Other

## 2023-01-01 DIAGNOSIS — R2681 Unsteadiness on feet: Secondary | ICD-10-CM

## 2023-01-01 DIAGNOSIS — M6281 Muscle weakness (generalized): Secondary | ICD-10-CM

## 2023-01-01 DIAGNOSIS — R262 Difficulty in walking, not elsewhere classified: Secondary | ICD-10-CM

## 2023-01-01 NOTE — Therapy (Signed)
OCCUPATIONAL THERAPY NEURO TREATMENT NOTE  Patient Name: Gregory Crane MRN: [id number] DOB:March 10, 1971, 52 y.o., male  PCP: Dale [place] REFERRING PROVIDER: Dale Pronghorn   END OF SESSION:  OT End of Session - 01/01/23 0929     Visit Number 16    Number of Visits 24    Date for OT Re-Evaluation 01/13/23    OT Start Time 0930    OT Stop Time 1015    OT Time Calculation (min) 45 min    Activity Tolerance Patient tolerated treatment well    Behavior During Therapy [facility] for tasks assessed/performed            Past Medical History:  Diagnosis Date   Allergy    Anemia    Bell's palsy    Diabetes mellitus without complication (HCC)    diet controlled   Hypertension    Hypothyroidism    Kidney stones    Pseudotumor cerebri    Stroke [facility])    Past Surgical History:  Procedure Laterality Date   COLONOSCOPY WITH PROPOFOL N/A 03/30/2020   Procedure: COLONOSCOPY WITH PROPOFOL;  Surgeon: Regis Bill, MD;  Location: [facility] ENDOSCOPY;  Service: Endoscopy;  Laterality: N/A;   LOOP RECORDER INSERTION N/A 01/27/2018   Procedure: LOOP RECORDER INSERTION;  Surgeon: Duke Salvia, MD;  Location: [facility] INVASIVE CV LAB;  Service: Cardiovascular;  Laterality: N/A;   LUMBAR PUNCTURE     as child   NO PAST SURGERIES     TEE WITHOUT CARDIOVERSION N/A 01/07/2018   Procedure: TRANSESOPHAGEAL ECHOCARDIOGRAM (TEE);  Surgeon: Antonieta Iba, MD;  Location: [facility] ORS;  Service: Cardiovascular;  Laterality: N/A;   Patient Active Problem List   Diagnosis Date Noted   Unsteady gait 10/05/2022   Hydronephrosis, left 05/04/2022   Type II diabetes mellitus with renal manifestations (HCC) 05/04/2022   Leukocytosis 05/04/2022   Pre-op evaluation 04/24/2022   Headache 07/07/2021   Hearing loss 07/07/2021   Open wound 01/25/2021   History of colon polyps 10/15/2020   Postoperative hemorrhage involving digestive system following digestive system procedure 09/19/2020   Acute cholecystitis  without calculus 09/09/2020   Type 2 diabetes mellitus, with long-term current use of insulin (HCC) 09/09/2020   Cryptogenic stroke (HCC) 07/13/2020   History of loop recorder 07/13/2020   Weakness 06/18/2020   History of 2019 novel coronavirus disease (COVID-19) 05/20/2020   ESRD (end stage renal disease) (HCC) 04/04/2020   Pneumonia due to COVID-19 virus 04/03/2020   AKI (acute kidney injury) (HCC) 04/03/2020   Elevated troponin 04/03/2020   Acquired trigger finger 06/08/2019   Lymphedema 06/08/2019   Anemia 04/17/2019   Swelling of left lower extremity 01/10/2019   Facial droop 04/09/2018   Daytime somnolence 03/30/2018   Carotid artery disease (HCC) 02/03/2018   Intracranial vascular stenosis 09/12/2017   Cough 01/20/2017   Bell's palsy 11/10/2016   History of CVA (cerebrovascular accident) 11/10/2016   Benign localized hyperplasia of prostate with urinary obstruction 10/27/2016   History of nephrolithiasis 10/27/2016   TIA (transient ischemic attack) 10/20/2016   Near syncope 06/23/2016   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 reports that it's hard not to use the right hand to assist the left hand. 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    PRECAUTIONS: Fall and Other: No strengthening exercises greater than 5-10 pounds in left arm.   Dialysis Tues, Th, Sat.  WEIGHT BEARING RESTRICTIONS: No  PAIN:  Are you having pain? No  FALLS: Has patient fallen in last 6 months? No  LIVING ENVIRONMENT: Lives with: alone Lives in: House/apartment Stairs: Yes: Internal: 15 steps; on right going up Has following equipment at home: Single point cane and Walker - 2 wheeled  PLOF: Independent  PATIENT GOALS: Pt reports he wants to have better use of his left hand, be able to do buttons and shoes.   OBJECTIVE:   HAND DOMINANCE: Right  ADLs: Overall ADLs: modified independent with most tasks Transfers/ambulation related to ADLs: Now walking without an assistive device, used cane in the past.  Eating: Independent with self feeding, difficulty with cutting food, requires increased focus and effort. Grooming: Independent UB Dressing: Difficulty with buttons, uses pullovers most of the time  LB Dressing: Difficulty with tying shoes Toileting: Independent Bathing: Independent Tub Shower transfers: Independent Equipment: none  IADLs: Shopping: independent,  uses a cart to push in the store Light housekeeping: modified independent Meal Prep: difficulty with tasks requiring use of left hand, bilateral hand use.   Community mobility: Ambulates without use of assistive device, able to drive Medication management: Independent Financial management: Independent Handwriting: 90% legible  MOBILITY STATUS: Needs Assist: Pt has progressed with PT, used to use a cane but ambulating without an assistive device but still has balance difficulties.    POSTURE COMMENTS:    Sitting balance: Sits without UE support up to 30 sec  ACTIVITY TOLERANCE: Activity tolerance: limited activity  tolerance  FUNCTIONAL OUTCOME MEASURES: FOTO: 53  UPPER EXTREMITY ROM:    Active ROM Right eval Left eval  Shoulder flexion [facility]  Shoulder abduction [facility]  Shoulder adduction [facility]  Shoulder extension [facility]  Shoulder internal rotation [facility]  Shoulder external rotation [facility]  Elbow flexion [facility]  Elbow extension [facility]  Wrist flexion [facility] WFL  Wrist extension [facility] WFL  Wrist ulnar deviation WFL WFL  Wrist radial deviation WFL WFL  Wrist pronation WFL WFL  Wrist supination WFL WFL  (Blank rows = not tested)  UPPER EXTREMITY MMT:     MMT Right eval Left eval  Shoulder flexion 5/5 3+/5  Shoulder abduction 5/5 3+/5  Shoulder adduction    Shoulder extension    Shoulder internal rotation    Shoulder external rotation    Middle trapezius    Lower trapezius    Elbow flexion 5/5 3+/5  Elbow extension 5/5 3+/5  Wrist flexion 5/5 3+/5  Wrist extension 5/5 3+/5  Wrist ulnar deviation 5/5 3+/5  Wrist radial deviation 5/5 3+/5  Wrist pronation 5/5 3+/5  Wrist supination 5/5 3+/5  (Blank rows = not tested)  Opposition of thumb to all digits on right, on left hand opposition to ring finger but cannot demonstrate  to small finger.   HAND FUNCTION: Grip strength: Right: 44 lbs; Left: 35 lbs, Lateral pinch: Right: 17 lbs, Left: 16 lbs, 3 point pinch: Right: 14 lbs, Left: 0 lbs, and Tip pinch: Right 2 lbs, Left: 0 lbs Measured 12/23/2022: Grip strength: Right: 50 lbs; Left: 43 lbs, Lateral pinch: Right: 10 lbs, Left: 8 lbs, 3 point pinch: Right:  9 lbs, Left: 8 lbs, and Tip pinch: Right 8 lbs, Left: 4 lbs  COORDINATION: 9 Hole Peg test: Right: 36 sec; Left: 56 sec Measured 12/23/2022: 9 Hole Peg test: Right: 32 sec; Left: 45 sec  TODAY'S TREATMENT: 01/01/2023                                                                                                                               Therapeutic Activity:  Pt. worked on [name] skills using the [employer]  Task. Pt. worked on sustaining grasp on the resistive tweezers while grasping this sticks, and moving them from a horizontal position to a vertical position to prepare for placing them into the pegboard. Pt. required verbal cues, and cues for visual demonstration for wrist position, and hand pattern when placing them into the pegboard. Pt. worked on removing the 1" thin sticks while alternating thumb opposition tho the 2nd through 5th digits. Pt. worked on grasping, and storing the 1" thin sticks in the palm of his left hand. Pt. worked on grasping extra small cubes, with the 2nd digit, and thumb, storing them, and stacking them with the left hand. Pt. worked on left hand [facility] skills grasping coins, storing them in the hand, and moving them through the hand from the palm to the tip of the 2nd digit, and thumb in preparation for placing them into a resistive container. Pt. Worked on progressing larger coins to smaller coins.     PATIENT EDUCATION: Education details: benefits of dycem for pill management and opening containers Person educated: Patient Education method: Explanation Education comprehension: verbalized understanding  HOME EXERCISE PROGRAM: Will provide HEP in 1-2 visits  GOALS: Goals reviewed with patient? Yes  SHORT TERM GOALS: Target date:  11/18/2022   Pt will demonstrate ability to hold and utilize utensils for cutting meat with modified independence. Baseline: difficulty with cutting, stabilizing item with fork for cutting. Goal status: IN PROGRESS  2.  Pt will demonstrate ability to obtain keys from left pocket without difficult and without dropping.   Baseline: pt struggles with reaching into left pocket to get keys.   Goal status: IN PROGRESS   LONG TERM GOALS: Target date: 01/13/2023   Pt will demonstrate HEP with modified independence.   Baseline: no current program for left UE.  Goal status: IN PROGRESS  2.  Pt will improve left UE coordination by 10 secs on 9  hole peg test to demonstrate shoe tying with modified independence.  Baseline: currently wearing slip on shoes and unable to tie shoes, left 9 hole peg test  in 56 sec at eval  Goal status: IN PROGRESS  3.  Pt will demonstrate improved coordination in left hand to demonstrate buttoning buttons on clothing with modified independence.  Baseline: difficulty with buttons and has been wearing more pullovers to avoid challenge of buttons. Goal status: IN PROGRESS  4.  Pt will demonstrate improvement in left grip strength by 5# to open jars and containers with modified independence and greater ease.   Baseline: Difficulty with opening jars and containers with use of left hand at eval.  Goal status: IN PROGRESS  5.  Pt will improve left UE strength by 1 mm grade to hold and hand wash dishes with modified independence.  Baseline: difficulty with being able to hand wash dishes at eval, difficulty stabilizing dish. Goal status: IN PROGRESS  6.  Pt will demonstrate FOTO score of 59 or greater to show a clinically relevant change in LUE to impact greater independence with daily ADL and IADL tasks at home and in the community.   Baseline:  Goal status: IN PROGRESS  ASSESSMENT: CLINICAL IMPRESSION:  Pt. focused on Left hand FMC/dexterity skills with emphasis placed on grasping small objects, and performing translatory movements moving items through the hand form the palm to the tip of the 2nd digit, and thumb in preparation for discarding them from the hand. Pt. presented with difficulty performing translatory movements, however was able to store the objects in the ulnar aspect of the hand while manipulating them with his 2nd digit, and thumb. Pt. dropped several 1" thin sticks, and small cubes from his hand. Pt. was able to stack the small cubes in 2 sets of 3 cubes. Pt. was able to grasp, and remove the pegs using thumb opposition to the tip of the 2nd through 4th digits. Pt continues to benefit from  skilled OT services to increase strength, coordination and hand function for necessary daily tasks.    PERFORMANCE DEFICITS: in functional skills including ADLs, IADLs, coordination, dexterity, sensation, tone, ROM, strength, flexibility, Fine motor control, balance, endurance, decreased knowledge of use of DME, and UE functional use, cognitive skills including, and psychosocial skills including environmental adaptation, habits, and routines and behaviors.   IMPAIRMENTS: are limiting patient from ADLs, IADLs, and social participation.   CO-MORBIDITIES: has co-morbidities such as CAD, ESRD with dialysis, deafness in right ear, diabetic retinopathy, HTN, neuropathy  that affects occupational performance. Patient will benefit from skilled OT to address above impairments and improve overall function.  MODIFICATION OR ASSISTANCE TO COMPLETE EVALUATION: Min-Moderate modification of tasks or assist with assess necessary to complete an evaluation.  OT OCCUPATIONAL PROFILE AND HISTORY: Comprehensive assessment: Review of records and extensive additional review of physical, cognitive, psychosocial history related to current functional performance.  CLINICAL DECISION MAKING: Moderate - several treatment options, min-mod task modification necessary  REHAB POTENTIAL: Good  EVALUATION COMPLEXITY: Moderate PLAN:  OT FREQUENCY: 2x/week  OT DURATION: 12 weeks  PLANNED INTERVENTIONS: self care/ADL training, therapeutic exercise, therapeutic activity, neuromuscular re-education, manual therapy, balance training, paraffin, moist heat, contrast bath, patient/family education, and DME and/or AE instructions  RECOMMENDED OTHER SERVICES: Pt is being seen by PT currently.   CONSULTED AND AGREED WITH PLAN OF CARE: Patient  PLAN FOR NEXT SESSION: Initiate HEP for grip, pinch and coordination skills.   Olegario Messier, MS, OTR/L  01/01/2023

## 2023-01-02 ENCOUNTER — Ambulatory Visit: Payer: Medicare Other

## 2023-01-03 ENCOUNTER — Ambulatory Visit (INDEPENDENT_AMBULATORY_CARE_PROVIDER_SITE_OTHER): Payer: Medicare Other | Admitting: Podiatry

## 2023-01-03 VITALS — BP 149/70

## 2023-01-03 DIAGNOSIS — M79675 Pain in left toe(s): Secondary | ICD-10-CM

## 2023-01-03 DIAGNOSIS — B351 Tinea unguium: Secondary | ICD-10-CM | POA: Diagnosis not present

## 2023-01-03 DIAGNOSIS — E0843 Diabetes mellitus due to underlying condition with diabetic autonomic (poly)neuropathy: Secondary | ICD-10-CM | POA: Diagnosis not present

## 2023-01-03 DIAGNOSIS — M79674 Pain in right toe(s): Secondary | ICD-10-CM

## 2023-01-06 ENCOUNTER — Ambulatory Visit: Payer: Medicare Other | Admitting: Occupational Therapy

## 2023-01-06 ENCOUNTER — Ambulatory Visit: Payer: Medicare Other | Admitting: Physical Therapy

## 2023-01-06 DIAGNOSIS — R269 Unspecified abnormalities of gait and mobility: Secondary | ICD-10-CM

## 2023-01-06 DIAGNOSIS — M6281 Muscle weakness (generalized): Secondary | ICD-10-CM

## 2023-01-06 DIAGNOSIS — R262 Difficulty in walking, not elsewhere classified: Secondary | ICD-10-CM

## 2023-01-06 DIAGNOSIS — R2681 Unsteadiness on feet: Secondary | ICD-10-CM

## 2023-01-06 DIAGNOSIS — R278 Other lack of coordination: Secondary | ICD-10-CM

## 2023-01-06 DIAGNOSIS — R2689 Other abnormalities of gait and mobility: Secondary | ICD-10-CM

## 2023-01-06 NOTE — Therapy (Signed)
OCCUPATIONAL THERAPY NEURO TREATMENT NOTE  Patient Name: Gregory Crane MRN: [id number] DOB:September 29, 1970, 52 y.o., male  PCP: Dale [place] REFERRING PROVIDER: Dale Page   END OF SESSION:  OT End of Session - 01/06/23 0851     Visit Number 17    Number of Visits 24    Date for OT Re-Evaluation 01/13/23    OT Start Time 0845    OT Stop Time 0930    OT Time Calculation (min) 45 min    Activity Tolerance Patient tolerated treatment well    Behavior During Therapy [facility] for tasks assessed/performed            Past Medical History:  Diagnosis Date   Allergy    Anemia    Bell's palsy    Diabetes mellitus without complication (HCC)    diet controlled   Hypertension    Hypothyroidism    Kidney stones    Pseudotumor cerebri    Stroke [facility])    Past Surgical History:  Procedure Laterality Date   COLONOSCOPY WITH PROPOFOL N/A 03/30/2020   Procedure: COLONOSCOPY WITH PROPOFOL;  Surgeon: Regis Bill, MD;  Location: [facility] ENDOSCOPY;  Service: Endoscopy;  Laterality: N/A;   LOOP RECORDER INSERTION N/A 01/27/2018   Procedure: LOOP RECORDER INSERTION;  Surgeon: Duke Salvia, MD;  Location: [facility] INVASIVE CV LAB;  Service: Cardiovascular;  Laterality: N/A;   LUMBAR PUNCTURE     as child   NO PAST SURGERIES     TEE WITHOUT CARDIOVERSION N/A 01/07/2018   Procedure: TRANSESOPHAGEAL ECHOCARDIOGRAM (TEE);  Surgeon: Antonieta Iba, MD;  Location: [facility] ORS;  Service: Cardiovascular;  Laterality: N/A;   Patient Active Problem List   Diagnosis Date Noted   Unsteady gait 10/05/2022   Steal syndrome of dialysis vascular access (HCC) 05/28/2022   Hydronephrosis, left 05/04/2022   Type II diabetes mellitus with renal manifestations (HCC) 05/04/2022   Leukocytosis 05/04/2022   Pre-op evaluation 04/24/2022   Chest pain 01/13/2022   Diabetic retinopathy associated with diabetes mellitus due to underlying condition (HCC) 01/13/2022   ESRD on hemodialysis (HCC) 01/13/2022    Deafness in right ear 08/11/2021   Headache 07/07/2021   Hearing loss 07/07/2021   Open wound 01/25/2021   History of colon polyps 10/15/2020   Postoperative hemorrhage involving digestive system following digestive system procedure 09/19/2020   Acute cholecystitis without calculus 09/09/2020   Type 2 diabetes mellitus, with long-term current use of insulin (HCC) 09/09/2020   Cryptogenic stroke (HCC) 07/13/2020   History of loop recorder 07/13/2020   Weakness 06/18/2020   History of 2019 novel coronavirus disease (COVID-19) 05/20/2020   ESRD (end stage renal disease) (HCC) 04/04/2020   Pneumonia due to COVID-19 virus 04/03/2020   AKI (acute kidney injury) (HCC) 04/03/2020   Elevated troponin 04/03/2020   Acquired trigger finger 06/08/2019   Lymphedema 06/08/2019   Anemia 04/17/2019   Swelling of left lower extremity 01/10/2019   Facial droop 04/09/2018   Daytime somnolence 03/30/2018   Carotid artery disease (HCC) 02/03/2018   Intracranial vascular stenosis 09/12/2017   Cough 01/20/2017   Bell's palsy 11/10/2016   History of CVA (cerebrovascular accident) 11/10/2016   Benign localized hyperplasia of prostate with urinary obstruction 10/27/2016   History of nephrolithiasis 10/27/2016   TIA (transient ischemic attack) 10/20/2016   Near syncope 06/23/2016   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   Neuropathy 08/06/2015   Health care maintenance 08/06/2015   Essential hypertension 08/06/2015   Heme positive stool 10/10/2013   Hypothyroidism 10/10/2013  Microalbuminuria 10/10/2013   Hyperlipidemia 10/10/2013   B12 deficiency 10/10/2013   Diabetes (HCC) 07/04/2013   Environmental allergies 07/04/2013    ONSET DATE: May 2023  REFERRING DIAG: CVA  THERAPY DIAG:  Muscle weakness (generalized)  Other lack of coordination  Rationale for Evaluation and Treatment: Rehabilitation  SUBJECTIVE:  SUBJECTIVE STATEMENT:  Pt. Reports doing well today. Pt accompanied by:  self  PERTINENT HISTORY: Patient with history of CVA, banding of fistula in left arm with worsening of LUE functional use over time since May 2023, he has been seeing PT for balance deficits.  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    PRECAUTIONS: Fall and Other: No strengthening exercises greater than 5-10 pounds in left arm.   Dialysis Tues, Th, Sat.  WEIGHT BEARING RESTRICTIONS: No  PAIN:  Are you having pain? No  FALLS: Has patient fallen in last 6 months? No  LIVING ENVIRONMENT: Lives with: alone Lives in: House/apartment Stairs: Yes: Internal: 15 steps; on right going up Has following equipment at home: Single point cane and Walker - 2 wheeled  PLOF: Independent  PATIENT GOALS: Pt reports he wants to have better use of his left hand, be able to do buttons and shoes.   OBJECTIVE:   HAND DOMINANCE: Right  ADLs: Overall ADLs: modified independent with most tasks Transfers/ambulation related to ADLs: Now walking without an assistive device, used cane in the past.  Eating: Independent with self feeding, difficulty with cutting food, requires increased focus and effort. Grooming: Independent UB Dressing: Difficulty with buttons, uses pullovers most of the time  LB Dressing: Difficulty with tying shoes Toileting: Independent Bathing: Independent Tub Shower transfers: Independent Equipment: none  IADLs: Shopping: independent,  uses a cart to push in the store Light housekeeping: modified independent Meal Prep: difficulty with tasks requiring use of left hand, bilateral hand use.   Community mobility: Ambulates without use of assistive device, able to drive Medication management: Independent Financial management: Independent Handwriting: 90% legible  MOBILITY STATUS: Needs Assist: Pt  has progressed with PT, used to use a cane but ambulating without an assistive device but still has balance difficulties.    POSTURE COMMENTS:    Sitting balance: Sits without UE support up to 30 sec  ACTIVITY TOLERANCE: Activity tolerance: limited activity tolerance  FUNCTIONAL OUTCOME MEASURES: FOTO: 53  UPPER EXTREMITY ROM:    Active ROM Right eval Left eval  Shoulder flexion [facility]  Shoulder abduction [facility]  Shoulder adduction [facility]  Shoulder extension [facility]  Shoulder internal rotation [facility]  Shoulder external rotation [facility]  Elbow flexion [facility] WFL  Elbow extension [facility]  Wrist flexion [facility] WFL  Wrist extension [facility] WFL  Wrist ulnar deviation [facility] WFL  Wrist radial deviation [facility] WFL  Wrist pronation [facility] WFL  Wrist supination WFL WFL  (Blank rows = not tested)  UPPER EXTREMITY MMT:     MMT Right eval Left eval  Shoulder flexion 5/5 3+/5  Shoulder abduction 5/5 3+/5  Shoulder adduction    Shoulder extension    Shoulder internal rotation    Shoulder external rotation    Middle trapezius    Lower trapezius    Elbow flexion 5/5 3+/5  Elbow extension 5/5 3+/5  Wrist flexion 5/5 3+/5  Wrist extension 5/5 3+/5  Wrist ulnar deviation 5/5 3+/5  Wrist radial deviation 5/5  3+/5  Wrist pronation 5/5 3+/5  Wrist supination 5/5 3+/5  (Blank rows = not tested)  Opposition of thumb to all digits on right, on left hand opposition to ring finger but cannot demonstrate to small finger.   HAND FUNCTION: Grip strength: Right: 44 lbs; Left: 35 lbs, Lateral pinch: Right: 17 lbs, Left: 16 lbs, 3 point pinch: Right: 14 lbs, Left: 0 lbs, and Tip pinch: Right 2 lbs, Left: 0 lbs Measured 12/23/2022: Grip strength: Right: 50 lbs; Left: 43 lbs, Lateral pinch: Right: 10 lbs, Left: 8 lbs, 3 point pinch: Right:  9 lbs, Left: 8 lbs, and Tip pinch: Right 8 lbs, Left: 4 lbs  COORDINATION: 9 Hole Peg test: Right: 36 sec; Left: 56 sec Measured 12/23/2022: 9 Hole Peg test: Right:  32 sec; Left: 45 sec  TODAY'S TREATMENT: 01/06/2023                                                                                                                               Therapeutic Ex.:  Pt. performed left gross gripping with a gross grip strengthener. Pt. worked on sustaining grip while grasping pegs and reaching at various heights. The gripper was set to  23.4# of grip strength resistance x's 2 trials, 17.9# for 1 trial. Pt. worked on setting up the board while holding , and storing 3-4 pegs in his hand at a time, and moving them one at a time from the palm to the tip of the 2nd through 5th digits. Pt. worked on pinch strengthening in the left hand for lateral, and 3pt. pinch using yellow, red, and green resistive clips. Pt. worked on placing the clips on a horizontal dowel. Tactile and verbal cues were required for eliciting the desired movement. Pt. Worked on translatory movements moving the clips from one pinch position to another. Pt. worked on grasping 1" resistive cubes alternating thumb opposition to the tip of the 2nd through 5th digits while the board is placed at a vertical angle. Pt. worked on pressing the cubes back into place while alternating isolated 2nd through 5th digit extension. Pt. Worked on digit flexion using the 1.5# DigiFlex.    PATIENT EDUCATION: Education details: benefits of dycem for pill management and opening containers Person educated: Patient Education method: Explanation Education comprehension: verbalized understanding  HOME EXERCISE PROGRAM: Will provide HEP in 1-2 visits  GOALS: Goals reviewed with patient? Yes  SHORT TERM GOALS: Target date:  11/18/2022   Pt will demonstrate ability to hold and utilize utensils for cutting meat with modified independence. Baseline: difficulty with cutting, stabilizing item with fork for cutting. Goal status: IN PROGRESS  2.  Pt will demonstrate ability to obtain keys from left pocket without difficult and  without dropping.   Baseline: pt struggles with reaching into left pocket to get keys.   Goal status: IN PROGRESS   LONG TERM GOALS: Target date: 01/13/2023   Pt will demonstrate HEP with modified independence.  Baseline: no current program for left UE.  Goal status: IN PROGRESS  2.  Pt will improve left UE coordination by 10 secs on 9 hole peg test to demonstrate shoe tying with modified independence.  Baseline: currently wearing slip on shoes and unable to tie shoes, left 9 hole peg test in 56 sec at eval  Goal status: IN PROGRESS  3.  Pt will demonstrate improved coordination in left hand to demonstrate buttoning buttons on clothing with modified independence.  Baseline: difficulty with buttons and has been wearing more pullovers to avoid challenge of buttons. Goal status: IN PROGRESS  4.  Pt will demonstrate improvement in left grip strength by 5# to open jars and containers with modified independence and greater ease.   Baseline: Difficulty with opening jars and containers with use of left hand at eval.  Goal status: IN PROGRESS  5.  Pt will improve left UE strength by 1 mm grade to hold and hand wash dishes with modified independence.  Baseline: difficulty with being able to hand wash dishes at eval, difficulty stabilizing dish. Goal status: IN PROGRESS  6.  Pt will demonstrate FOTO score of 59 or greater to show a clinically relevant change in LUE to impact greater independence with daily ADL and IADL tasks at home and in the community.   Baseline:  Goal status: IN PROGRESS  ASSESSMENT: CLINICAL IMPRESSION:  Pt. presents with no pain today.  Pt.'s left hand fatigues with the grip strengthening, and required the grip tension to be modified the 17.9# for the 3rd trial.  Pt. Presented with difficulty motor planning through translatory movements of the left hand, and required increased time to move the clips within the left hand. Pt. Presented with increased difficulty with the  green level resistance. Pt. Had difficulty with 5th digit flexion on the DigiFlex. Pt continues to benefit from skilled OT services to increase strength, coordination and hand function for necessary daily tasks.    PERFORMANCE DEFICITS: in functional skills including ADLs, IADLs, coordination, dexterity, sensation, tone, ROM, strength, flexibility, Fine motor control, balance, endurance, decreased knowledge of use of DME, and UE functional use, cognitive skills including, and psychosocial skills including environmental adaptation, habits, and routines and behaviors.   IMPAIRMENTS: are limiting patient from ADLs, IADLs, and social participation.   CO-MORBIDITIES: has co-morbidities such as CAD, ESRD with dialysis, deafness in right ear, diabetic retinopathy, HTN, neuropathy  that affects occupational performance. Patient will benefit from skilled OT to address above impairments and improve overall function.  MODIFICATION OR ASSISTANCE TO COMPLETE EVALUATION: Min-Moderate modification of tasks or assist with assess necessary to complete an evaluation.  OT OCCUPATIONAL PROFILE AND HISTORY: Comprehensive assessment: Review of records and extensive additional review of physical, cognitive, psychosocial history related to current functional performance.  CLINICAL DECISION MAKING: Moderate - several treatment options, min-mod task modification necessary  REHAB POTENTIAL: Good  EVALUATION COMPLEXITY: Moderate PLAN:  OT FREQUENCY: 2x/week  OT DURATION: 12 weeks  PLANNED INTERVENTIONS: self care/ADL training, therapeutic exercise, therapeutic activity, neuromuscular re-education, manual therapy, balance training, paraffin, moist heat, contrast bath, patient/family education, and DME and/or AE instructions  RECOMMENDED OTHER SERVICES: Pt is being seen by PT currently.   CONSULTED AND AGREED WITH PLAN OF CARE: Patient  PLAN FOR NEXT SESSION: Initiate HEP for grip, pinch and coordination skills.    Olegario Messier, MS, OTR/L  01/06/2023

## 2023-01-06 NOTE — Therapy (Signed)
OUTPATIENT PHYSICAL THERAPY NEURO TREATMENT  PHYSICAL THERAPY PROGRESS NOTE   Dates of reporting period  12/02/2022 to 01/06/2023      Patient Name: Gregory Crane MRN: [id number] DOB:29-Mar-1971, 52 y.o., male Today's Date: 01/06/2023  PCP: Dale [place] MD REFERRING PROVIDER: Dale [place] MD  PT End of Session - 01/06/23 0802     Visit Number 50    Number of Visits 60    Date for PT Re-Evaluation 02/03/23    Authorization Type Medicare    Authorization Time Period 11/11/22-02/03/23    Progress Note Due on Visit 60    PT Start Time 0802    PT Stop Time 0845    PT Time Calculation (min) 43 min    Equipment Utilized During Treatment Gait belt    Activity Tolerance Patient tolerated treatment well;No increased pain    Behavior During Therapy WFL for tasks assessed/performed                        Past Medical History:  Diagnosis Date   Allergy    Anemia    Bell's palsy    Diabetes mellitus without complication (HCC)    diet controlled   Hypertension    Hypothyroidism    Kidney stones    Pseudotumor cerebri    Stroke [facility])    Past Surgical History:  Procedure Laterality Date   COLONOSCOPY WITH PROPOFOL N/A 03/30/2020   Procedure: COLONOSCOPY WITH PROPOFOL;  Surgeon: Regis Bill, MD;  Location: [facility] ENDOSCOPY;  Service: Endoscopy;  Laterality: N/A;   LOOP RECORDER INSERTION N/A 01/27/2018   Procedure: LOOP RECORDER INSERTION;  Surgeon: Duke Salvia, MD;  Location: [facility] INVASIVE CV LAB;  Service: Cardiovascular;  Laterality: N/A;   LUMBAR PUNCTURE     as child   NO PAST SURGERIES     TEE WITHOUT CARDIOVERSION N/A 01/07/2018   Procedure: TRANSESOPHAGEAL ECHOCARDIOGRAM (TEE);  Surgeon: Antonieta Iba, MD;  Location: [facility] ORS;  Service: Cardiovascular;  Laterality: N/A;   Patient Active Problem List   Diagnosis Date Noted   Unsteady gait 10/05/2022   Steal syndrome of dialysis vascular access (HCC) 05/28/2022   Hydronephrosis, left  05/04/2022   Type II diabetes mellitus with renal manifestations (HCC) 05/04/2022   Leukocytosis 05/04/2022   Pre-op evaluation 04/24/2022   Chest pain 01/13/2022   Diabetic retinopathy associated with diabetes mellitus due to underlying condition (HCC) 01/13/2022   ESRD on hemodialysis (HCC) 01/13/2022   Deafness in right ear 08/11/2021   Headache 07/07/2021   Hearing loss 07/07/2021   Open wound 01/25/2021   History of colon polyps 10/15/2020   Postoperative hemorrhage involving digestive system following digestive system procedure 09/19/2020   Acute cholecystitis without calculus 09/09/2020   Type 2 diabetes mellitus, with long-term current use of insulin (HCC) 09/09/2020   Cryptogenic stroke (HCC) 07/13/2020   History of loop recorder 07/13/2020   Weakness 06/18/2020   History of 2019 novel coronavirus disease (COVID-19) 05/20/2020   ESRD (end stage renal disease) (HCC) 04/04/2020   Pneumonia due to COVID-19 virus 04/03/2020   AKI (acute kidney injury) (HCC) 04/03/2020   Elevated troponin 04/03/2020   Acquired trigger finger 06/08/2019   Lymphedema 06/08/2019   Anemia 04/17/2019   Swelling of left lower extremity 01/10/2019   Facial droop 04/09/2018   Daytime somnolence 03/30/2018   Carotid artery disease (HCC) 02/03/2018   Intracranial vascular stenosis 09/12/2017   Cough 01/20/2017   Bell's palsy 11/10/2016   History of  CVA (cerebrovascular accident) 11/10/2016   Benign localized hyperplasia of prostate with urinary obstruction 10/27/2016   History of nephrolithiasis 10/27/2016   TIA (transient ischemic attack) 10/20/2016   Near syncope 06/23/2016   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   Neuropathy 08/06/2015   Health care maintenance 08/06/2015   Essential hypertension 08/06/2015   Heme positive stool 10/10/2013   Hypothyroidism 10/10/2013   Microalbuminuria 10/10/2013   Hyperlipidemia 10/10/2013   B12 deficiency 10/10/2013   Diabetes (HCC) 07/04/2013   Environmental  allergies 07/04/2013   ONSET DATE: 2-3 years  REFERRING DIAG: Neuropathy  THERAPY DIAG:  Muscle weakness (generalized)  Unsteadiness on feet  Difficulty in walking, not elsewhere classified  Other lack of coordination  Other abnormalities of gait and mobility  Abnormality of gait and mobility  Rationale for Evaluation and Treatment Rehabilitation  SUBJECTIVE:                                                                                                                                                                                             SUBJECTIVE STATEMENT:  Patients reports no new changes since last session. No falls or LOB since last session. Was able to see grandchildren over the weekend. Mild low/mid back stiffness on this day from sleeping wrong over night.    Pt accompanied by: self  PERTINENT HISTORY: Patient presents to physical therapy for neuropathy. He underwent banding of his L UE fistula on 05/13/22 and scheduled for additional banding on 06/07/22. 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 Patient reports his balance is very unsteady due to neuropathy in feet, feels weak in LE's.   PAIN:  No LBP, 4/10. Stiffness.   PRECAUTIONS: Fall  WEIGHT BEARING RESTRICTIONS Yes no lifting >5 lb in arm   FALLS: Has patient fallen in last 6 months? No  PLOF: Independent  PATIENT GOALS to be more steady and walk as normally as possible.   OBJECTIVE:   TODAY'S TREATMENT:  Nustep level 3 x 5 min cues for decreased hip abduction intermittently throughout session   Pt performed 5 time sit<>stand (5xSTS): 10.12. sec with UE pushing from arm rest. 10.94 sec pushing from thighs. No UE support 12.24sec.  (>15 sec indicates increased fall risk)   6 Min Walk Test:  Instructed patient  to ambulate as quickly and as safely as possible for 6 minutes using LRAD. Patient was allowed to take standing rest breaks without stopping the test, but if the patient required a sitting rest break the clock would  be stopped and the test would be over.  Results: 1185 feet using no AD without assist from PT. Results indicate that the patient has reduced endurance with ambulation compared to age matched norms.  Age Matched Norms: 35-69 yo M: 69 F: 58, 44-79 yo M: 22 F: 471, 29-89 yo M: 417 F: 392 MDC: 58.21 meters (190.98 feet) or 50 meters (ANPTA Core Set of Outcome Measures for Adults with Neurologic Conditions, 2018)  PT instructed pt in DGI. See below for results. Demonstrates increased fall risk with score of 18/24. (<19 indicates increased fall risk)   SLS with finger tip support 2 x 10sec then performed without UE supprot LLE x 3 sec and RLE x 4 sec      PATIENT EDUCATION: Education details: HEP, goals, POC. Pt educated throughout session about proper posture and technique with exercises. Improved exercise technique, movement at target joints, use of target muscles after min to mod verbal, visual, tactile cues.  Person educated: Patient Education method: Explanation, Demonstration, Tactile cues, Verbal cues, and Handouts Education comprehension: verbalized understanding, returned demonstration, verbal cues required, and tactile cues required  HOME EXERCISE PROGRAM: Access Code: [email or web address] URL: [email or web address] Date: 06/03/2022 Prepared by: Precious Bard  Exercises - Seated Heel Toe Raises  - 1 x daily - 7 x weekly - 2 sets - 10 reps - 5 hold - Standing Tandem Balance with Counter Support  - 1 x daily - 7 x weekly - 2 sets - 2 reps - 30 hold - Standing March with Counter Support  - 1 x daily - 7 x weekly - 2 sets - 10 reps - 5 hold   GOALS: Goals reviewed with patient? Yes   SHORT TERM GOALS: Target date: 07/01/2022  Patient will be independent in home  exercise program to improve strength/mobility for better functional independence with ADLs. Baseline:10/9; HEP given 2/21: HEP compliant  Goal status: MET   LONG TERM GOALS: Target date: 02/03/2023  Patient will increase FOTO score to equal to or greater than   63%  to demonstrate statistically significant improvement in mobility and quality of life.  Baseline: 10/9: 53% 11/29: 58% 12/27: 51% 1/17: 56% 2/21: 53% 3/18: 60%  4/8 58%  Goal status: IN PROGRESS  2.  Patient (< 24 years old) will complete five times sit to stand test in < 10 seconds without UE support indicating an increased LE strength and improved balance. Baseline: 10/9: 19.6 seconds with walking stick; one LOB 11/29: 20.56 seconds 12/27: 13.4 seconds hands on knees 1/17: 13 seconds hands on knees  2/21: 14 seconds no hands 3/18: 12 seconds no hands. 4/8 11.3 sec UE on knees. 5/13: 10.94 sec pushing from thighs.  Goal status: IN PROGRESS  3.  Patient will increase Berg Balance score by > 45/56 to demonstrate decreased fall risk during functional activities. Baseline: 10/9: 33/56 11/29: 16/10 96/04: 54/09 8/11:  91/47 2/21: 46/56 3/18: 48/56  Goal status: MET  +4.  Patient will increase 10 meter walk test to >1.61m/s as to improve gait speed for better community ambulation and to reduce fall risk. Baseline: 11/29: 1.19 m/s Goal status: MET  5.  Patient will increase ABC scale score >80% to demonstrate better functional mobility and better confidence with ADLs.  Baseline: 10/9: 67% 11/29: 75.6% 12/27: 78%  2/21: 78% 3/18: 80% Goal status: MET  6. Patient will tolerate 5 seconds of single leg stance without loss of balance to improve ability to get in and out of shower safely. Baseline:  11/29: unable to stand on single leg safely 12/27: 2 seconds 1/17: 3 seconds 2/21: 3 seconds 3/18: 4 seconds. 4/8: 3 sec  5/13: LLE x 3 sec and RLE x 4 sec  Goal status: IN PROGRESS   6. Patient will increase six minute walk test distance to  >1000 for progression to community ambulator and improve gait ability Baseline: 12/27: 845 ft with with walking stick 1/17: 74ft 2/21: 930 ft  3/18: 950 ft 4/8: 1146ft. 5/13: 1185 Goal status: met  7. Patient will increase dynamic gait index score to >19/24 as to demonstrate reduced fall risk and improved dynamic gait balance for better safety with community/home ambulation.  Baseline: 3/18:  9/24. 4/8: 16/24 5/13: 18/24 Goal status: in Progress   ASSESSMENT:  CLINICAL IMPRESSION: Patient presents with good motivation. Progress report to assess progress towards LTG. Pt demonstrates mild improvement in function, but conintued fall risk as evidenced by 5xSTS with no UE support 12.24sec and with UE pushing from thighs 10.94sec, 6 min walk test 1123ft. And DGI 18/24.  Pt will continue to benefit from skilled PT to make progress in BLE strength, balance, and coordination to return to PLOF and improve QoL.   OBJECTIVE IMPAIRMENTS Abnormal gait, decreased activity tolerance, decreased balance, decreased coordination, decreased endurance, decreased mobility, difficulty walking, decreased strength, impaired flexibility, impaired sensation, improper body mechanics, and pain.   ACTIVITY LIMITATIONS carrying, lifting, bending, standing, squatting, sleeping, stairs, transfers, bed mobility, bathing, toileting, dressing, locomotion level, and caring for others  PARTICIPATION LIMITATIONS: meal prep, cleaning, laundry, medication management, personal finances, interpersonal relationship, driving, shopping, community activity, and yard work  PERSONAL FACTORS Age, Fitness, Past/current experiences, Time since onset of injury/illness/exacerbation, Transportation, and 3+ comorbidities: CAD, cryptogenic stroke (2021), deafness in R ear, diabetic retinopathy, dialysis, ESRD, COVID, HTN, hypothyroidism, leukocytosis, nephrolithiasis, neuropathy, Pseudotumor cerebri 1986, sepsis, DM type I, DM type II  are also  affecting patient's functional outcome.   REHAB POTENTIAL: Good  CLINICAL DECISION MAKING: Evolving/moderate complexity  EVALUATION COMPLEXITY: Moderate  PLAN: PT FREQUENCY: 2x/week  PT DURATION: 12 weeks  PLANNED INTERVENTIONS: Therapeutic exercises, Therapeutic activity, Neuromuscular re-education, Balance training, Gait training, Patient/Family education, Self Care, Joint mobilization, Stair training, Vestibular training, Canalith repositioning, Visual/preceptual remediation/compensation, DME instructions, Dry Needling, Cognitive remediation, Spinal mobilization, Cryotherapy, Moist heat, Manual lymph drainage, Compression bandaging, Taping, Vasopneumatic device, Ultrasound, Manual therapy, and Re-evaluation  PLAN FOR NEXT SESSION:   BLE strength, continue to progress dynamic balance and dual tasking balance. Continue to monitor BP as appropriate  Golden Pop PT ,DPT Physical Therapist- [place]  [facility]

## 2023-01-07 ENCOUNTER — Ambulatory Visit: Payer: Medicare Other

## 2023-01-07 ENCOUNTER — Encounter: Payer: Self-pay | Admitting: Podiatry

## 2023-01-07 NOTE — Progress Notes (Signed)
  Subjective:  Patient ID: Gregory Crane, male    DOB: [birth year],  MRN: [id number]  Gregory Crane presents to clinic today for at risk foot care with history of diabetic neuropathy and painful elongated mycotic toenails 1-5 bilaterally which are tender when wearing enclosed shoe gear. Pain is relieved with periodic professional debridement.  Chief Complaint  Patient presents with   Nail Problem    DFC-A1C:6.5,B/S:135,Referring Provider Dale [place], MD,LOV:02/24      New problem(s): None.   PCP is Dale [place], MD.  No Known Allergies  Review of Systems: Negative except as noted in the HPI.  Objective: No changes noted in today's physical examination. Vitals:   01/03/23 1055  BP: (!) 149/70   Gregory Crane is a pleasant 52 y.o. male obese in NAD. AAO x 3.  Vascular Examination: Capillary refill time immediate b/l. Vascular status intact b/l with palpable pedal pulses. Pedal hair present b/l. No edema. No pain with calf compression b/l. Skin temperature gradient WNL b/l.   Neurological Examination: Pt has subjective symptoms of neuropathy. Sensation grossly intact b/l with 10 gram monofilament. Vibratory sensation intact b/l.   Dermatological Examination: Pedal skin with normal turgor, texture and tone b/l.  No open wounds. No interdigital macerations.   Toenails 1-5 b/l thick, discolored, elongated with subungual debris and pain on dorsal palpation.   No hyperkeratotic nor porokeratotic lesions present on today's visit.  Musculoskeletal Examination: Normal muscle strength 5/5 to all lower extremity muscle groups bilaterally. No pain, crepitus or joint limitation noted with ROM b/l LE. No gross bony pedal deformities b/l. Patient ambulates independently without assistive aids.  Radiographs: None  Last A1c:      Latest Ref Rng & Units 10/04/2022   10:52 AM 04/24/2022   10:46 AM  Hemoglobin A1C  Hemoglobin-A1c 4.6 - 6.5 % 6.8  6.9    Assessment/Plan: 1.  Pain due to onychomycosis of toenails of both feet   2. Diabetes mellitus due to underlying condition with diabetic autonomic neuropathy, unspecified whether long term insulin use (HCC)    -Patient was evaluated and treated. All patient's and/or POA's questions/concerns answered on today's visit. -Continue foot and shoe inspections daily. Monitor blood glucose per PCP/Endocrinologist's recommendations. -Patient to continue soft, supportive shoe gear daily. -Toenails 1-5 b/l were debrided in length and girth with sterile nail nippers and dremel without iatrogenic bleeding.  -Patient/POA to call should there be question/concern in the interim.   Return in about 3 months (around 04/05/2023).  [name], DPM

## 2023-01-08 ENCOUNTER — Ambulatory Visit: Payer: Medicare Other

## 2023-01-08 DIAGNOSIS — M6281 Muscle weakness (generalized): Secondary | ICD-10-CM | POA: Diagnosis not present

## 2023-01-08 DIAGNOSIS — R2681 Unsteadiness on feet: Secondary | ICD-10-CM

## 2023-01-08 DIAGNOSIS — R262 Difficulty in walking, not elsewhere classified: Secondary | ICD-10-CM

## 2023-01-08 DIAGNOSIS — R278 Other lack of coordination: Secondary | ICD-10-CM

## 2023-01-08 NOTE — Therapy (Signed)
OUTPATIENT PHYSICAL THERAPY NEURO TREATMENT      Patient Name: Gregory Crane MRN: [id number] DOB:09-16-1970, 52 y.o., male Today's Date: 01/08/2023  PCP: Dale Pataskala MD REFERRING PROVIDER: Dale Lynbrook MD  PT End of Session - 01/08/23 1106     Visit Number 51    Number of Visits 60    Date for PT Re-Evaluation 02/03/23    Authorization Type Medicare    Authorization Time Period 11/11/22-02/03/23    Progress Note Due on Visit 60    PT Start Time 1018    PT Stop Time 1100    PT Time Calculation (min) 42 min    Equipment Utilized During Treatment Gait belt    Activity Tolerance Patient tolerated treatment well;No increased pain    Behavior During Therapy WFL for tasks assessed/performed                        Past Medical History:  Diagnosis Date   Allergy    Anemia    Bell's palsy    Diabetes mellitus without complication (HCC)    diet controlled   Hypertension    Hypothyroidism    Kidney stones    Pseudotumor cerebri    Stroke [facility])    Past Surgical History:  Procedure Laterality Date   COLONOSCOPY WITH PROPOFOL N/A 03/30/2020   Procedure: COLONOSCOPY WITH PROPOFOL;  Surgeon: Regis Bill, MD;  Location: [facility] ENDOSCOPY;  Service: Endoscopy;  Laterality: N/A;   LOOP RECORDER INSERTION N/A 01/27/2018   Procedure: LOOP RECORDER INSERTION;  Surgeon: Duke Salvia, MD;  Location: [facility] INVASIVE CV LAB;  Service: Cardiovascular;  Laterality: N/A;   LUMBAR PUNCTURE     as child   NO PAST SURGERIES     TEE WITHOUT CARDIOVERSION N/A 01/07/2018   Procedure: TRANSESOPHAGEAL ECHOCARDIOGRAM (TEE);  Surgeon: Antonieta Iba, MD;  Location: [facility] ORS;  Service: Cardiovascular;  Laterality: N/A;   Patient Active Problem List   Diagnosis Date Noted   Unsteady gait 10/05/2022   Steal syndrome of dialysis vascular access (HCC) 05/28/2022   Hydronephrosis, left 05/04/2022   Type II diabetes mellitus with renal manifestations (HCC) 05/04/2022    Leukocytosis 05/04/2022   Pre-op evaluation 04/24/2022   Chest pain 01/13/2022   Diabetic retinopathy associated with diabetes mellitus due to underlying condition (HCC) 01/13/2022   ESRD on hemodialysis (HCC) 01/13/2022   Deafness in right ear 08/11/2021   Headache 07/07/2021   Hearing loss 07/07/2021   Open wound 01/25/2021   History of colon polyps 10/15/2020   Postoperative hemorrhage involving digestive system following digestive system procedure 09/19/2020   Acute cholecystitis without calculus 09/09/2020   Type 2 diabetes mellitus, with long-term current use of insulin (HCC) 09/09/2020   Cryptogenic stroke (HCC) 07/13/2020   History of loop recorder 07/13/2020   Weakness 06/18/2020   History of 2019 novel coronavirus disease (COVID-19) 05/20/2020   ESRD (end stage renal disease) (HCC) 04/04/2020   Pneumonia due to COVID-19 virus 04/03/2020   AKI (acute kidney injury) (HCC) 04/03/2020   Elevated troponin 04/03/2020   Acquired trigger finger 06/08/2019   Lymphedema 06/08/2019   Anemia 04/17/2019   Swelling of left lower extremity 01/10/2019   Facial droop 04/09/2018   Daytime somnolence 03/30/2018   Carotid artery disease (HCC) 02/03/2018   Intracranial vascular stenosis 09/12/2017   Cough 01/20/2017   Bell's palsy 11/10/2016   History of CVA (cerebrovascular accident) 11/10/2016   Benign localized hyperplasia of prostate with urinary obstruction 10/27/2016  History of nephrolithiasis 10/27/2016   TIA (transient ischemic attack) 10/20/2016   Near syncope 06/23/2016   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   Neuropathy 08/06/2015   Health care maintenance 08/06/2015   Essential hypertension 08/06/2015   Heme positive stool 10/10/2013   Hypothyroidism 10/10/2013   Microalbuminuria 10/10/2013   Hyperlipidemia 10/10/2013   B12 deficiency 10/10/2013   Diabetes (HCC) 07/04/2013   Environmental allergies 07/04/2013   ONSET DATE: 2-3 years  REFERRING DIAG: Neuropathy  THERAPY  DIAG:  Unsteadiness on feet  Muscle weakness (generalized)  Difficulty in walking, not elsewhere classified  Rationale for Evaluation and Treatment Rehabilitation  SUBJECTIVE:                                                                                                                                                                                             SUBJECTIVE STATEMENT:  Pt reports no changes, reports no falls, no pain. No other concerns reported. Pt reports difficulty ascending steps.   Pt accompanied by: self  PERTINENT HISTORY: Patient presents to physical therapy for neuropathy. He underwent banding of his L UE fistula on 05/13/22 and scheduled for additional banding on 06/07/22. 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 Patient reports his balance is very unsteady due to neuropathy in feet, feels weak in LE's.   PAIN:  No LBP, 4/10. Stiffness.   PRECAUTIONS: Fall  WEIGHT BEARING RESTRICTIONS Yes no lifting >5 lb in arm   FALLS: Has patient fallen in last 6 months? No  PLOF: Independent  PATIENT GOALS to be more steady and walk as normally as possible.   OBJECTIVE:   TODAY'S TREATMENT:    TherEx Nustep reciprocal endurance training BUE/BLE, cues for LE positioning   Level 1 x 1 min 30 sec - warm- up Level 3 x 1 minute 30 sec Level 5 x 2 min 30 sec  Level 3 x 1 min  Level 2 x 1 min  SPM maintained in 70s-90. Pt rates overall as "a little medium," but does report increased intensity with lvl 5   12x STS, rates challenging with last few reps   Heel raise 20x. Pt rates easy-medium Single leg heel raise 4x each side - slightly more AROM on L side   NMR: Stepping LTL onto and off of 2 airex pads x multiple reps LTL stepping on airex beam with BUE>UUE support x  multiple reps  One foot on floor, one foot on 6" step 2x30 sec each LE   One foot  on airex, one foot on 6" step 2x30 sec each LE Comments: intermittent UE support throughout  SLB balance progression on soccer ball CW/CC 2 rounds 10-16 reps each LE, with UUE support  SLB 30 sec each LE with UUE support    PATIENT EDUCATION: Education details: Pt educated throughout session about proper posture and technique with exercises. Improved exercise technique, movement at target joints, use of target muscles after min to mod verbal, visual, tactile cues.  Person educated: Patient Education method: Explanation, Demonstration, Tactile cues, and Verbal cues Education comprehension: verbalized understanding, returned demonstration, verbal cues required, and tactile cues required  HOME EXERCISE PROGRAM: Access Code: [email or web address] URL: [email or web address] Date: 06/03/2022 Prepared by: Precious Bard  Exercises - Seated Heel Toe Raises  - 1 x daily - 7 x weekly - 2 sets - 10 reps - 5 hold - Standing Tandem Balance with Counter Support  - 1 x daily - 7 x weekly - 2 sets - 2 reps - 30 hold - Standing March with Counter Support  - 1 x daily - 7 x weekly - 2 sets - 10 reps - 5 hold   GOALS: Goals reviewed with patient? Yes   SHORT TERM GOALS: Target date: 07/01/2022  Patient will be independent in home exercise program to improve strength/mobility for better functional independence with ADLs. Baseline:10/9; HEP given 2/21: HEP compliant  Goal status: MET   LONG TERM GOALS: Target date: 02/03/2023  Patient will increase FOTO score to equal to or greater than   63%  to demonstrate statistically significant improvement in mobility and quality of life.  Baseline: 10/9: 53% 11/29: 58% 12/27: 51% 1/17: 56% 2/21: 53% 3/18: 60%  4/8 58%  Goal status: IN PROGRESS  2.  Patient (< 56 years old) will complete five times sit to stand test in < 10 seconds without UE support indicating an  increased LE strength and improved balance. Baseline: 10/9: 19.6 seconds with walking stick; one LOB 11/29: 20.56 seconds 12/27: 13.4 seconds hands on knees 1/17: 13 seconds hands on knees  2/21: 14 seconds no hands 3/18: 12 seconds no hands. 4/8 11.3 sec UE on knees. 5/13: 10.94 sec pushing from thighs.  Goal status: IN PROGRESS  3.  Patient will increase Berg Balance score by > 45/56 to demonstrate decreased fall risk during functional activities. Baseline: 10/9: 33/56 11/29: 16/10 96/04: 54/09 8/11:  91/47 2/21: 46/56 3/18: 48/56  Goal status: MET  +4.  Patient will increase 10 meter walk test to >1.82m/s as to improve gait speed for better community ambulation and to reduce fall risk. Baseline: 11/29: 1.19 m/s Goal status: MET  5.  Patient will increase ABC scale score >80% to demonstrate better functional mobility and better confidence with ADLs.  Baseline: 10/9: 67% 11/29: 75.6% 12/27: 78%  2/21: 78% 3/18: 80% Goal status: MET  6. Patient will tolerate 5 seconds of single leg stance without loss of balance to improve ability to get in and out of shower safely. Baseline: 11/29: unable to stand on single leg safely 12/27: 2 seconds 1/17: 3 seconds 2/21: 3 seconds 3/18: 4 seconds. 4/8: 3 sec  5/13: LLE x 3 sec and RLE x 4 sec  Goal status: IN PROGRESS   6. Patient will increase six minute walk test distance to >1000 for progression to community ambulator and improve gait ability Baseline: 12/27: 845 ft with with walking stick 1/17: 75ft 2/21: 930 ft  3/18: 950 ft 4/8: 1139ft. 5/13: 1185 Goal status: met  7.  Patient will increase dynamic gait index score to >19/24 as to demonstrate reduced fall risk and improved dynamic gait balance for better safety with community/home ambulation.  Baseline: 3/18:  9/24. 4/8: 16/24 5/13: 18/24 Goal status: in Progress   ASSESSMENT:  CLINICAL IMPRESSION: The pt tolerates interventions well without pain and is able to advance a few interventions with  increased time/reps, indicating improved activity tolerance. Pt generally requires intermittent to UUE support for majority of balance interventions, particularly with interventions focused on SLB/SLB progressions.  The pt will continue to benefit from skilled PT to make progress in BLE strength, balance, and coordination to return to PLOF and improve QoL.   OBJECTIVE IMPAIRMENTS Abnormal gait, decreased activity tolerance, decreased balance, decreased coordination, decreased endurance, decreased mobility, difficulty walking, decreased strength, impaired flexibility, impaired sensation, improper body mechanics, and pain.   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   REHAB POTENTIAL: Good  CLINICAL DECISION MAKING: Evolving/moderate complexity  EVALUATION COMPLEXITY: Moderate  PLAN: PT FREQUENCY: 2x/week  PT DURATION: 12 weeks  PLANNED INTERVENTIONS: Therapeutic exercises, Therapeutic activity, Neuromuscular re-education, Balance training, Gait training, Patient/Family education, Self Care, Joint mobilization, Stair training, Vestibular training, Canalith repositioning, Visual/preceptual remediation/compensation, DME instructions, Dry Needling, Cognitive remediation, Spinal  mobilization, Cryotherapy, Moist heat, Manual lymph drainage, Compression bandaging, Taping, Vasopneumatic device, Ultrasound, Manual therapy, and Re-evaluation  PLAN FOR NEXT SESSION:   BLE strength, continue to progress dynamic balance and dual tasking balance. Continue to monitor BP as appropriate  Baird Kay PT ,DPT Physical Therapist- [place]  [facility]

## 2023-01-08 NOTE — Therapy (Signed)
OCCUPATIONAL THERAPY NEURO TREATMENT NOTE  Patient Name: Gregory Crane MRN: [id number] DOB:[birth year], 52 y.o., male  PCP: Dale Ravalli REFERRING PROVIDER: Dale [place]  END OF SESSION:  OT End of Session - 01/08/23 1110     Visit Number 18    Number of Visits 24    Date for OT Re-Evaluation 01/13/23    Progress Note Due on Visit 10    OT Start Time 1100    OT Stop Time 1145    OT Time Calculation (min) 45 min    Activity Tolerance Patient tolerated treatment well    Behavior During Therapy WFL for tasks assessed/performed            Past Medical History:  Diagnosis Date   Allergy    Anemia    Bell's palsy    Diabetes mellitus without complication (HCC)    diet controlled   Hypertension    Hypothyroidism    Kidney stones    Pseudotumor cerebri    Stroke [facility])    Past Surgical History:  Procedure Laterality Date   COLONOSCOPY WITH PROPOFOL N/A 03/30/2020   Procedure: COLONOSCOPY WITH PROPOFOL;  Surgeon: Regis Bill, MD;  Location: [facility] ENDOSCOPY;  Service: Endoscopy;  Laterality: N/A;   LOOP RECORDER INSERTION N/A 01/27/2018   Procedure: LOOP RECORDER INSERTION;  Surgeon: Duke Salvia, MD;  Location: [facility] INVASIVE CV LAB;  Service: Cardiovascular;  Laterality: N/A;   LUMBAR PUNCTURE     as child   NO PAST SURGERIES     TEE WITHOUT CARDIOVERSION N/A 01/07/2018   Procedure: TRANSESOPHAGEAL ECHOCARDIOGRAM (TEE);  Surgeon: Antonieta Iba, MD;  Location: [facility] ORS;  Service: Cardiovascular;  Laterality: N/A;   Patient Active Problem List   Diagnosis Date Noted   Unsteady gait 10/05/2022   Steal syndrome of dialysis vascular access (HCC) 05/28/2022   Hydronephrosis, left 05/04/2022   Type II diabetes mellitus with renal manifestations (HCC) 05/04/2022   Leukocytosis 05/04/2022   Pre-op evaluation 04/24/2022   Chest pain 01/13/2022   Diabetic retinopathy associated with diabetes mellitus due to underlying condition (HCC) 01/13/2022   ESRD on  hemodialysis (HCC) 01/13/2022   Deafness in right ear 08/11/2021   Headache 07/07/2021   Hearing loss 07/07/2021   Open wound 01/25/2021   History of colon polyps 10/15/2020   Postoperative hemorrhage involving digestive system following digestive system procedure 09/19/2020   Acute cholecystitis without calculus 09/09/2020   Type 2 diabetes mellitus, with long-term current use of insulin (HCC) 09/09/2020   Cryptogenic stroke (HCC) 07/13/2020   History of loop recorder 07/13/2020   Weakness 06/18/2020   History of 2019 novel coronavirus disease (COVID-19) 05/20/2020   ESRD (end stage renal disease) (HCC) 04/04/2020   Pneumonia due to COVID-19 virus 04/03/2020   AKI (acute kidney injury) (HCC) 04/03/2020   Elevated troponin 04/03/2020   Acquired trigger finger 06/08/2019   Lymphedema 06/08/2019   Anemia 04/17/2019   Swelling of left lower extremity 01/10/2019   Facial droop 04/09/2018   Daytime somnolence 03/30/2018   Carotid artery disease (HCC) 02/03/2018   Intracranial vascular stenosis 09/12/2017   Cough 01/20/2017   Bell's palsy 11/10/2016   History of CVA (cerebrovascular accident) 11/10/2016   Benign localized hyperplasia of prostate with urinary obstruction 10/27/2016   History of nephrolithiasis 10/27/2016   TIA (transient ischemic attack) 10/20/2016   Near syncope 06/23/2016   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   Neuropathy 08/06/2015   Health care maintenance 08/06/2015   Essential hypertension 08/06/2015   Heme  positive stool 10/10/2013   Hypothyroidism 10/10/2013   Microalbuminuria 10/10/2013   Hyperlipidemia 10/10/2013   B12 deficiency 10/10/2013   Diabetes (HCC) 07/04/2013   Environmental allergies 07/04/2013   ONSET DATE: May 2023  REFERRING DIAG: CVA  THERAPY DIAG:  Muscle weakness (generalized)  Other lack of coordination  Rationale for Evaluation and Treatment: Rehabilitation  SUBJECTIVE:  SUBJECTIVE STATEMENT: Pt reports that he continues to  struggle with weakness and manipulating small items in his hand and that his small finger frequently pulls away when trying to grip things. 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    PRECAUTIONS: Fall and Other: No strengthening exercises greater than 5-10 pounds in left arm.   Dialysis Tues, Th, Sat.  WEIGHT BEARING RESTRICTIONS: No  PAIN:  Are you having pain? No  FALLS: Has patient fallen in last 6 months? No  LIVING ENVIRONMENT: Lives with: alone Lives in: House/apartment Stairs: Yes: Internal: 15 steps; on right going up Has following equipment at home: Single point cane and Walker - 2 wheeled  PLOF: Independent  PATIENT GOALS: Pt reports he wants to have better use of his left hand, be able to do buttons and shoes.   OBJECTIVE:   HAND DOMINANCE: Right  ADLs: Overall ADLs: modified independent with most tasks Transfers/ambulation related to ADLs: Now walking without an assistive device, used cane in the past.  Eating: Independent with self feeding, difficulty with cutting food, requires increased focus and effort. Grooming: Independent UB Dressing: Difficulty with buttons, uses pullovers most of the time  LB Dressing: Difficulty with tying shoes Toileting: Independent Bathing: Independent Tub Shower transfers: Independent Equipment: none  IADLs: Shopping: independent,  uses a cart to push in the store Light housekeeping: modified independent Meal Prep: difficulty with tasks requiring use of left hand, bilateral hand use.   Community mobility: Ambulates without use of  assistive device, able to drive Medication management: Independent Financial management: Independent Handwriting: 90% legible  MOBILITY STATUS: Needs Assist: Pt has progressed with PT, used to use a cane but ambulating without an assistive device but still has balance difficulties.    POSTURE COMMENTS:    Sitting balance: Sits without UE support up to 30 sec  ACTIVITY TOLERANCE: Activity tolerance: limited activity tolerance  FUNCTIONAL OUTCOME MEASURES: FOTO: 53  UPPER EXTREMITY ROM:    Active ROM Right eval Left eval  Shoulder flexion [facility]  Shoulder abduction [facility]  Shoulder adduction [facility]  Shoulder extension [facility]  Shoulder internal rotation [facility]  Shoulder external rotation [facility]  Elbow flexion [facility] WFL  Elbow extension [facility]  Wrist flexion [facility] WFL  Wrist extension [facility] WFL  Wrist ulnar deviation [facility] WFL  Wrist radial deviation WFL WFL  Wrist pronation WFL WFL  Wrist supination WFL WFL  (Blank rows = not tested)  UPPER EXTREMITY MMT:     MMT Right eval Left eval  Shoulder flexion 5/5 3+/5  Shoulder abduction 5/5 3+/5  Shoulder adduction    Shoulder extension    Shoulder internal rotation    Shoulder external rotation    Middle trapezius    Lower trapezius  Elbow flexion 5/5 3+/5  Elbow extension 5/5 3+/5  Wrist flexion 5/5 3+/5  Wrist extension 5/5 3+/5  Wrist ulnar deviation 5/5 3+/5  Wrist radial deviation 5/5 3+/5  Wrist pronation 5/5 3+/5  Wrist supination 5/5 3+/5  (Blank rows = not tested)  Opposition of thumb to all digits on right, on left hand opposition to ring finger but cannot demonstrate to small finger.   HAND FUNCTION: Grip strength: Right: 44 lbs; Left: 35 lbs, Lateral pinch: Right: 17 lbs, Left: 16 lbs, 3 point pinch: Right: 14 lbs, Left: 0 lbs, and Tip pinch: Right 2 lbs, Left: 0 lbs Measured 12/23/2022: Grip strength: Right: 50 lbs; Left: 43 lbs, Lateral pinch: Right: 10 lbs, Left: 8 lbs, 3 point pinch: Right:  9  lbs, Left: 8 lbs, and Tip pinch: Right 8 lbs, Left: 4 lbs  COORDINATION: 9 Hole Peg test: Right: 36 sec; Left: 56 sec Measured 12/23/2022: 9 Hole Peg test: Right: 32 sec; Left: 45 sec  TODAY'S TREATMENT: 01/08/2023                                                                                                                              Therapeutic Ex.: Pt. performed left gross gripping with a gross grip strengthener. Pt. worked on sustaining grip while grasping pegs from pegboard and releasing into container.  Pt performed set up of jumbo pegs into pegboard, requiring vc and demo to attempt picking up pegs using a 2 or 3 point pinch pattern rather than a palmar grasp. The gripper was set to 23.4# of grip strength resistance x 3 trials.  Pt required vc and intermittent min A to set up hand gripper in palm to ensure L SF was engaged in each gripping rep.  Facilitated L hand strengthening with participation in EZ board tools.  Pt worked with large dial turn to practice PIP flexion and extension of digits on the wide strip of velcro, and then worked on rolling the lateral edge of the large dial on narrow strip of velcro to target lumbrical strengthening, cueing pt to turn dial with fingertips while maintaining PIP extension.  Pt completed 3-4 reps (up/down board= 1 rep) with each position noted above.  Instructed pt in additional intrinsic strengthening with active and active assisted digit abd/add with palm flat on table top, tendon glides x5 reps (handout issued), and isometric strengthening for lumbricals using R hand to maintain PIP extension.    PATIENT EDUCATION: Education details: intrinsic strengthening for L hand Person educated: Patient Education method: Explanation, demo, written handout Education comprehension: verbalized understanding, min vc, tactile cues  HOME EXERCISE PROGRAM: Theraputty, tendon glides, intrinsic strengthening with digit abd/add and lumbrical isometric  strengthening.  GOALS: Goals reviewed with patient? Yes  SHORT TERM GOALS: Target date:  11/18/2022   Pt will demonstrate ability to hold and utilize utensils for cutting meat with modified independence. Baseline: difficulty with cutting, stabilizing item with fork for cutting. Goal status:  IN PROGRESS  2.  Pt will demonstrate ability to obtain keys from left pocket without difficult and without dropping.   Baseline: pt struggles with reaching into left pocket to get keys.   Goal status: IN PROGRESS   LONG TERM GOALS: Target date: 01/13/2023   Pt will demonstrate HEP with modified independence.   Baseline: no current program for left UE.  Goal status: IN PROGRESS  2.  Pt will improve left UE coordination by 10 secs on 9 hole peg test to demonstrate shoe tying with modified independence.  Baseline: currently wearing slip on shoes and unable to tie shoes, left 9 hole peg test in 56 sec at eval  Goal status: IN PROGRESS  3.  Pt will demonstrate improved coordination in left hand to demonstrate buttoning buttons on clothing with modified independence.  Baseline: difficulty with buttons and has been wearing more pullovers to avoid challenge of buttons. Goal status: IN PROGRESS  4.  Pt will demonstrate improvement in left grip strength by 5# to open jars and containers with modified independence and greater ease.   Baseline: Difficulty with opening jars and containers with use of left hand at eval.  Goal status: IN PROGRESS  5.  Pt will improve left UE strength by 1 mm grade to hold and hand wash dishes with modified independence.  Baseline: difficulty with being able to hand wash dishes at eval, difficulty stabilizing dish. Goal status: IN PROGRESS  6.  Pt will demonstrate FOTO score of 59 or greater to show a clinically relevant change in LUE to impact greater independence with daily ADL and IADL tasks at home and in the community.   Baseline:  Goal status: IN  PROGRESS  ASSESSMENT: CLINICAL IMPRESSION: Pt reports that he continues to struggle with weakness and manipulating small items in his L hand, and that his small finger frequently pulls away when trying to grip things.  Pt did require vc and intermittent min A to set up hand gripper in palm to ensure L SF was engaged in each gripping rep, and occasional assist from the R hand to wrap the L SF around hand gripper.  Pt was able to tolerate 3 trials on the 23.4# setting, but pt had several dropped pegs on the 3rd trial.  L hand continues to present with significant intrinsic muscle atrophy.  OT instructed pt in lumbrical and interosseous strengthening exercises this date, including active assisted and isometric strengthening.  Handout issued for new exercises.  Pt continues to benefit from skilled OT services to increase strength, coordination and hand function for necessary daily tasks.    PERFORMANCE DEFICITS: in functional skills including ADLs, IADLs, coordination, dexterity, sensation, tone, ROM, strength, flexibility, Fine motor control, balance, endurance, decreased knowledge of use of DME, and UE functional use, cognitive skills including, and psychosocial skills including environmental adaptation, habits, and routines and behaviors.   IMPAIRMENTS: are limiting patient from ADLs, IADLs, and social participation.   CO-MORBIDITIES: has co-morbidities such as CAD, ESRD with dialysis, deafness in right ear, diabetic retinopathy, HTN, neuropathy  that affects occupational performance. Patient will benefit from skilled OT to address above impairments and improve overall function.  MODIFICATION OR ASSISTANCE TO COMPLETE EVALUATION: Min-Moderate modification of tasks or assist with assess necessary to complete an evaluation.  OT OCCUPATIONAL PROFILE AND HISTORY: Comprehensive assessment: Review of records and extensive additional review of physical, cognitive, psychosocial history related to current  functional performance.  CLINICAL DECISION MAKING: Moderate - several treatment options, min-mod task modification necessary  REHAB POTENTIAL: Good  EVALUATION COMPLEXITY: Moderate PLAN:  OT FREQUENCY: 2x/week  OT DURATION: 12 weeks  PLANNED INTERVENTIONS: self care/ADL training, therapeutic exercise, therapeutic activity, neuromuscular re-education, manual therapy, balance training, paraffin, moist heat, contrast bath, patient/family education, and DME and/or AE instructions  RECOMMENDED OTHER SERVICES: Pt is being seen by PT currently.   CONSULTED AND AGREED WITH PLAN OF CARE: Patient  PLAN FOR NEXT SESSION: Initiate HEP for grip, pinch and coordination skills.   Danelle Earthly, MS, OTR/L

## 2023-01-09 ENCOUNTER — Ambulatory Visit: Payer: Medicare Other

## 2023-01-09 ENCOUNTER — Other Ambulatory Visit: Payer: Self-pay | Admitting: Internal Medicine

## 2023-01-13 ENCOUNTER — Ambulatory Visit: Payer: Medicare Other | Admitting: Physical Therapy

## 2023-01-13 ENCOUNTER — Ambulatory Visit: Payer: Medicare Other | Admitting: Occupational Therapy

## 2023-01-13 DIAGNOSIS — M6281 Muscle weakness (generalized): Secondary | ICD-10-CM | POA: Diagnosis not present

## 2023-01-13 DIAGNOSIS — R2681 Unsteadiness on feet: Secondary | ICD-10-CM

## 2023-01-13 DIAGNOSIS — R2689 Other abnormalities of gait and mobility: Secondary | ICD-10-CM

## 2023-01-13 DIAGNOSIS — R269 Unspecified abnormalities of gait and mobility: Secondary | ICD-10-CM

## 2023-01-13 DIAGNOSIS — R262 Difficulty in walking, not elsewhere classified: Secondary | ICD-10-CM

## 2023-01-13 DIAGNOSIS — R278 Other lack of coordination: Secondary | ICD-10-CM

## 2023-01-13 NOTE — Therapy (Signed)
OCCUPATIONAL THERAPY NEURO TREATMENT NOTE  Patient Name: Gregory Crane MRN: [id number] DOB:04-Oct-1970, 52 y.o., male  PCP: Dale [place] REFERRING PROVIDER: Dale M[place]  END OF SESSION:  OT End of Session - 01/13/23 1005     Visit Number 19    Number of Visits 24    Date for OT Re-Evaluation 01/13/23    OT Start Time 0935    OT Stop Time 1015    OT Time Calculation (min) 40 min    Activity Tolerance Patient tolerated treatment well    Behavior During Therapy [facility] for tasks assessed/performed            Past Medical History:  Diagnosis Date   Allergy    Anemia    Bell's palsy    Diabetes mellitus without complication (HCC)    diet controlled   Hypertension    Hypothyroidism    Kidney stones    Pseudotumor cerebri    Stroke [facility])    Past Surgical History:  Procedure Laterality Date   COLONOSCOPY WITH PROPOFOL N/A 03/30/2020   Procedure: COLONOSCOPY WITH PROPOFOL;  Surgeon: Regis Bill, MD;  Location: [facility] ENDOSCOPY;  Service: Endoscopy;  Laterality: N/A;   LOOP RECORDER INSERTION N/A 01/27/2018   Procedure: LOOP RECORDER INSERTION;  Surgeon: Duke Salvia, MD;  Location: [facility] INVASIVE CV LAB;  Service: Cardiovascular;  Laterality: N/A;   LUMBAR PUNCTURE     as child   NO PAST SURGERIES     TEE WITHOUT CARDIOVERSION N/A 01/07/2018   Procedure: TRANSESOPHAGEAL ECHOCARDIOGRAM (TEE);  Surgeon: Antonieta Iba, MD;  Location: [facility] ORS;  Service: Cardiovascular;  Laterality: N/A;   Patient Active Problem List   Diagnosis Date Noted   Unsteady gait 10/05/2022   Steal syndrome of dialysis vascular access (HCC) 05/28/2022   Hydronephrosis, left 05/04/2022   Type II diabetes mellitus with renal manifestations (HCC) 05/04/2022   Leukocytosis 05/04/2022   Pre-op evaluation 04/24/2022   Chest pain 01/13/2022   Diabetic retinopathy associated with diabetes mellitus due to underlying condition (HCC) 01/13/2022   ESRD on hemodialysis (HCC) 01/13/2022   Deafness  in right ear 08/11/2021   Headache 07/07/2021   Hearing loss 07/07/2021   Open wound 01/25/2021   History of colon polyps 10/15/2020   Postoperative hemorrhage involving digestive system following digestive system procedure 09/19/2020   Acute cholecystitis without calculus 09/09/2020   Type 2 diabetes mellitus, with long-term current use of insulin (HCC) 09/09/2020   Cryptogenic stroke (HCC) 07/13/2020   History of loop recorder 07/13/2020   Weakness 06/18/2020   History of 2019 novel coronavirus disease (COVID-19) 05/20/2020   ESRD (end stage renal disease) (HCC) 04/04/2020   Pneumonia due to COVID-19 virus 04/03/2020   AKI (acute kidney injury) (HCC) 04/03/2020   Elevated troponin 04/03/2020   Acquired trigger finger 06/08/2019   Lymphedema 06/08/2019   Anemia 04/17/2019   Swelling of left lower extremity 01/10/2019   Facial droop 04/09/2018   Daytime somnolence 03/30/2018   Carotid artery disease (HCC) 02/03/2018   Intracranial vascular stenosis 09/12/2017   Cough 01/20/2017   Bell's palsy 11/10/2016   History of CVA (cerebrovascular accident) 11/10/2016   Benign localized hyperplasia of prostate with urinary obstruction 10/27/2016   History of nephrolithiasis 10/27/2016   TIA (transient ischemic attack) 10/20/2016   Near syncope 06/23/2016   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   Neuropathy 08/06/2015   Health care maintenance 08/06/2015   Essential hypertension 08/06/2015   Heme positive stool 10/10/2013   Hypothyroidism 10/10/2013  Microalbuminuria 10/10/2013   Hyperlipidemia 10/10/2013   B12 deficiency 10/10/2013   Diabetes (HCC) 07/04/2013   Environmental allergies 07/04/2013   ONSET DATE: May 2023  REFERRING DIAG: CVA  THERAPY DIAG:  Muscle weakness (generalized)  Rationale for Evaluation and Treatment: Rehabilitation  SUBJECTIVE:  SUBJECTIVE STATEMENT: Pt  left hand weakness Pt accompanied by: self  PERTINENT HISTORY: Patient with history of CVA, banding of  fistula in left arm with worsening of LUE functional use over time since May 2023, he has been seeing PT for balance deficits.  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    PRECAUTIONS: Fall and Other: No strengthening exercises greater than 5-10 pounds in left arm.   Dialysis Tues, Th, Sat.  WEIGHT BEARING RESTRICTIONS: No  PAIN:  Are you having pain? No  FALLS: Has patient fallen in last 6 months? No  LIVING ENVIRONMENT: Lives with: alone Lives in: House/apartment Stairs: Yes: Internal: 15 steps; on right going up Has following equipment at home: Single point cane and Walker - 2 wheeled  PLOF: Independent  PATIENT GOALS: Pt reports he wants to have better use of his left hand, be able to do buttons and shoes.   OBJECTIVE:   HAND DOMINANCE: Right  ADLs: Overall ADLs: modified independent with most tasks Transfers/ambulation related to ADLs: Now walking without an assistive device, used cane in the past.  Eating: Independent with self feeding, difficulty with cutting food, requires increased focus and effort. Grooming: Independent UB Dressing: Difficulty with buttons, uses pullovers most of the time  LB Dressing: Difficulty with tying shoes Toileting: Independent Bathing: Independent Tub Shower transfers: Independent Equipment: none  IADLs: Shopping: independent,  uses a cart to push in the store Light housekeeping: modified independent Meal Prep: difficulty with tasks requiring use of left hand, bilateral hand use.   Community mobility: Ambulates without use of assistive device, able to drive Medication management: Independent Financial management: Independent Handwriting: 90% legible  MOBILITY STATUS: Needs Assist: Pt has progressed with PT, used to use a cane but ambulating without  an assistive device but still has balance difficulties.    POSTURE COMMENTS:    Sitting balance: Sits without UE support up to 30 sec  ACTIVITY TOLERANCE: Activity tolerance: limited activity tolerance  FUNCTIONAL OUTCOME MEASURES: FOTO: 53  UPPER EXTREMITY ROM:    Active ROM Right eval Left eval  Shoulder flexion [facility]  Shoulder abduction [facility]  Shoulder adduction [facility]  Shoulder extension [facility]  Shoulder internal rotation [facility]  Shoulder external rotation [facility]  Elbow flexion [facility] WFL  Elbow extension [facility]  Wrist flexion [facility] WFL  Wrist extension [facility] WFL  Wrist ulnar deviation [facility] WFL  Wrist radial deviation [facility] WFL  Wrist pronation [facility] WFL  Wrist supination WFL WFL  (Blank rows = not tested)  UPPER EXTREMITY MMT:     MMT Right eval Left eval  Shoulder flexion 5/5 3+/5  Shoulder abduction 5/5 3+/5  Shoulder adduction    Shoulder extension    Shoulder internal rotation    Shoulder external rotation    Middle trapezius    Lower trapezius    Elbow flexion 5/5 3+/5  Elbow extension 5/5 3+/5  Wrist flexion 5/5 3+/5  Wrist extension 5/5 3+/5  Wrist ulnar deviation 5/5 3+/5  Wrist radial deviation 5/5 3+/5  Wrist pronation 5/5 3+/5  Wrist supination 5/5 3+/5  (Blank rows = not tested)  Opposition of thumb to all digits on right, on left hand opposition to ring finger but cannot demonstrate to small finger.   HAND FUNCTION: Grip strength: Right: 44 lbs; Left: 35 lbs, Lateral pinch: Right: 17 lbs, Left: 16 lbs, 3 point pinch: Right: 14 lbs, Left: 0 lbs, and Tip pinch: Right 2 lbs, Left: 0 lbs Measured 12/23/2022: Grip strength: Right: 50 lbs; Left: 43 lbs, Lateral pinch: Right: 10 lbs, Left: 8 lbs, 3 point pinch: Right:  9 lbs, Left: 8 lbs, and Tip pinch: Right 8 lbs, Left: 4 lbs  COORDINATION: 9 Hole Peg test: Right: 36 sec; Left: 56 sec Measured 12/23/2022: 9 Hole Peg test: Right: 32 sec; Left: 45 sec  TODAY'S TREATMENT: 01/13/2023                                                                                                                                Therapeutic Ex.:  Pt. Performed reps of intrinsic stretches, and tendon glides at the tabletop in a gravity eliminated plane. Pt. Worked left hand 2nd through 5th digit extension with resistance, thumb radial, and palmar abduction, and thumb opposition.  Neuromuscular re-education:   Pt. worked on left hand [facility] skills grasping.  Pt. worked on [facility] skills manipulating nuts, and bolts on a bolt board with his vision occluded. Pt. Worked on screwing, and unscrewing nuts, and bolts of varying sizes, and challenging progressively smaller items.      PATIENT EDUCATION: Education details: intrinsic strengthening for L hand Person educated: Patient Education method: Explanation, demo, written handout Education comprehension: verbalized understanding, min vc, tactile cues  HOME EXERCISE PROGRAM: Theraputty, tendon glides, intrinsic strengthening with digit abd/add and lumbrical isometric strengthening.  GOALS: Goals reviewed with patient? Yes  SHORT TERM GOALS: Target date:  11/18/2022   Pt will demonstrate ability to hold and utilize utensils for cutting meat with modified independence. Baseline: difficulty with cutting, stabilizing item with fork for cutting. Goal status: IN PROGRESS  2.  Pt will demonstrate ability to obtain keys from left pocket without difficult and without dropping.   Baseline: pt struggles with reaching into left pocket to get keys.   Goal status: IN PROGRESS   LONG TERM GOALS: Target date: 01/13/2023   Pt will demonstrate HEP with modified independence.   Baseline: no current program for left UE.  Goal status: IN PROGRESS  2.  Pt will improve left UE coordination by 10 secs on 9 hole peg test to demonstrate shoe tying with modified independence.  Baseline: currently wearing slip on shoes and unable to tie shoes, left 9 hole peg test in 56 sec at eval   Goal status: IN PROGRESS  3.  Pt will demonstrate improved coordination in left hand to demonstrate buttoning buttons on clothing with modified independence.  Baseline: difficulty with buttons and has been wearing more pullovers to avoid challenge of buttons. Goal status:  IN PROGRESS  4.  Pt will demonstrate improvement in left grip strength by 5# to open jars and containers with modified independence and greater ease.   Baseline: Difficulty with opening jars and containers with use of left hand at eval.  Goal status: IN PROGRESS  5.  Pt will improve left UE strength by 1 mm grade to hold and hand wash dishes with modified independence.  Baseline: difficulty with being able to hand wash dishes at eval, difficulty stabilizing dish. Goal status: IN PROGRESS  6.  Pt will demonstrate FOTO score of 59 or greater to show a clinically relevant change in LUE to impact greater independence with daily ADL and IADL tasks at home and in the community.   Baseline:  Goal status: IN PROGRESS  ASSESSMENT:  CLINICAL IMPRESSION:  Pt. presents with limited intrinsic hand strength with increased weakness in the 4th and 5th digits.  Pt. Presented with difficulty performing left hand [facility] manipulating the large, and medium sized nuts and bolts. Pt. Presents with difficulty identifying the direction of which he was turning then nuts on the bolts when loosening, and tightening them. Pt. education was provided in lumbrical and interosseous strengthening exercises this date, including active assisted and isometric strengthening.  Pt continues to benefit from skilled OT services to increase strength, coordination and hand function for necessary daily tasks.    PERFORMANCE DEFICITS: in functional skills including ADLs, IADLs, coordination, dexterity, sensation, tone, ROM, strength, flexibility, Fine motor control, balance, endurance, decreased knowledge of use of DME, and UE functional use, cognitive skills including,  and psychosocial skills including environmental adaptation, habits, and routines and behaviors.   IMPAIRMENTS: are limiting patient from ADLs, IADLs, and social participation.   CO-MORBIDITIES: has co-morbidities such as CAD, ESRD with dialysis, deafness in right ear, diabetic retinopathy, HTN, neuropathy  that affects occupational performance. Patient will benefit from skilled OT to address above impairments and improve overall function.  MODIFICATION OR ASSISTANCE TO COMPLETE EVALUATION: Min-Moderate modification of tasks or assist with assess necessary to complete an evaluation.  OT OCCUPATIONAL PROFILE AND HISTORY: Comprehensive assessment: Review of records and extensive additional review of physical, cognitive, psychosocial history related to current functional performance.  CLINICAL DECISION MAKING: Moderate - several treatment options, min-mod task modification necessary  REHAB POTENTIAL: Good  EVALUATION COMPLEXITY: Moderate PLAN:  OT FREQUENCY: 2x/week  OT DURATION: 12 weeks  PLANNED INTERVENTIONS: self care/ADL training, therapeutic exercise, therapeutic activity, neuromuscular re-education, manual therapy, balance training, paraffin, moist heat, contrast bath, patient/family education, and DME and/or AE instructions  RECOMMENDED OTHER SERVICES: Pt is being seen by PT currently.   CONSULTED AND AGREED WITH PLAN OF CARE: Patient  PLAN FOR NEXT SESSION: Initiate HEP for grip, pinch and coordination skills.   Olegario Messier, MS, OTR/L  MS, OTR/L

## 2023-01-13 NOTE — Therapy (Signed)
OUTPATIENT PHYSICAL THERAPY NEURO TREATMENT      Patient Name: Gregory Crane MRN: [id number] DOB:[birth year], 52 y.o., male Today's Date: 01/13/2023  PCP: Dale [place] MD REFERRING PROVIDER: Dale [place] MD  PT End of Session - 01/13/23 0849     Visit Number 52    Number of Visits 60    Date for PT Re-Evaluation 02/03/23    Authorization Type Medicare    Authorization Time Period 11/11/22-02/03/23    Progress Note Due on Visit 60    PT Start Time 0847    PT Stop Time 0930    PT Time Calculation (min) 43 min    Equipment Utilized During Treatment Gait belt    Activity Tolerance Patient tolerated treatment well;No increased pain    Behavior During Therapy WFL for tasks assessed/performed                        Past Medical History:  Diagnosis Date   Allergy    Anemia    Bell's palsy    Diabetes mellitus without complication (HCC)    diet controlled   Hypertension    Hypothyroidism    Kidney stones    Pseudotumor cerebri    Stroke [facility])    Past Surgical History:  Procedure Laterality Date   COLONOSCOPY WITH PROPOFOL N/A 03/30/2020   Procedure: COLONOSCOPY WITH PROPOFOL;  Surgeon: Regis Bill, MD;  Location: [facility] ENDOSCOPY;  Service: Endoscopy;  Laterality: N/A;   LOOP RECORDER INSERTION N/A 01/27/2018   Procedure: LOOP RECORDER INSERTION;  Surgeon: Duke Salvia, MD;  Location: [facility] INVASIVE CV LAB;  Service: Cardiovascular;  Laterality: N/A;   LUMBAR PUNCTURE     as child   NO PAST SURGERIES     TEE WITHOUT CARDIOVERSION N/A 01/07/2018   Procedure: TRANSESOPHAGEAL ECHOCARDIOGRAM (TEE);  Surgeon: Antonieta Iba, MD;  Location: [facility] ORS;  Service: Cardiovascular;  Laterality: N/A;   Patient Active Problem List   Diagnosis Date Noted   Unsteady gait 10/05/2022   Steal syndrome of dialysis vascular access (HCC) 05/28/2022   Hydronephrosis, left 05/04/2022   Type II diabetes mellitus with renal manifestations (HCC) 05/04/2022    Leukocytosis 05/04/2022   Pre-op evaluation 04/24/2022   Chest pain 01/13/2022   Diabetic retinopathy associated with diabetes mellitus due to underlying condition (HCC) 01/13/2022   ESRD on hemodialysis (HCC) 01/13/2022   Deafness in right ear 08/11/2021   Headache 07/07/2021   Hearing loss 07/07/2021   Open wound 01/25/2021   History of colon polyps 10/15/2020   Postoperative hemorrhage involving digestive system following digestive system procedure 09/19/2020   Acute cholecystitis without calculus 09/09/2020   Type 2 diabetes mellitus, with long-term current use of insulin (HCC) 09/09/2020   Cryptogenic stroke (HCC) 07/13/2020   History of loop recorder 07/13/2020   Weakness 06/18/2020   History of 2019 novel coronavirus disease (COVID-19) 05/20/2020   ESRD (end stage renal disease) (HCC) 04/04/2020   Pneumonia due to COVID-19 virus 04/03/2020   AKI (acute kidney injury) (HCC) 04/03/2020   Elevated troponin 04/03/2020   Acquired trigger finger 06/08/2019   Lymphedema 06/08/2019   Anemia 04/17/2019   Swelling of left lower extremity 01/10/2019   Facial droop 04/09/2018   Daytime somnolence 03/30/2018   Carotid artery disease (HCC) 02/03/2018   Intracranial vascular stenosis 09/12/2017   Cough 01/20/2017   Bell's palsy 11/10/2016   History of CVA (cerebrovascular accident) 11/10/2016   Benign localized hyperplasia of prostate with urinary obstruction 10/27/2016  History of nephrolithiasis 10/27/2016   TIA (transient ischemic attack) 10/20/2016   Near syncope 06/23/2016   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   Neuropathy 08/06/2015   Health care maintenance 08/06/2015   Essential hypertension 08/06/2015   Heme positive stool 10/10/2013   Hypothyroidism 10/10/2013   Microalbuminuria 10/10/2013   Hyperlipidemia 10/10/2013   B12 deficiency 10/10/2013   Diabetes (HCC) 07/04/2013   Environmental allergies 07/04/2013   ONSET DATE: 2-3 years  REFERRING DIAG: Neuropathy  THERAPY  DIAG:  Muscle weakness (generalized)  Other lack of coordination  Difficulty in walking, not elsewhere classified  Other abnormalities of gait and mobility  Abnormality of gait and mobility  Unsteadiness on feet  Rationale for Evaluation and Treatment Rehabilitation  SUBJECTIVE:                                                                                                                                                                                             SUBJECTIVE STATEMENT:  Pt reports no changes, reports no falls, no pain. No other concerns reported. Pt reports difficulty ascending steps.   Pt accompanied by: self  PERTINENT HISTORY: Patient presents to physical therapy for neuropathy. He underwent banding of his L UE fistula on 05/13/22 and scheduled for additional banding on 06/07/22. 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 Patient reports his balance is very unsteady due to neuropathy in feet, feels weak in LE's.   PAIN:  No LBP, 4/10. Stiffness.   PRECAUTIONS: Fall  WEIGHT BEARING RESTRICTIONS Yes no lifting >5 lb in arm   FALLS: Has patient fallen in last 6 months? No  PLOF: Independent  PATIENT GOALS to be more steady and walk as normally as possible.   OBJECTIVE:   TODAY'S TREATMENT:    TherEx  seated Reciprocal hip flexion 4# ankle weight. 2x 12  LAQ 4#, 2 x 12 Hip abduction GTB 2 x 12  Ankle DF/PF 2x 12 bil 4#  Gait training with 4# ankle weight 3 x 385ft with cues for safety in turns and decreased force of heel contact on BLE as able.   Sit<>stand 2x 10with UE pushing from thighs. Dynamic gait training to step over 2 canes in floor andover half bolster 2x6 with cues for step length on the LLE as well as positioning of safety proir to stepping over  bolster.   Foot tap on 6 inch step performed x 10 bil with no UE support and x 10 bil with UE supported on rails.   Throughout  session, multiple seated rest breaks required for safety as well as CGA for all dynamic gait and balance to reduce fall risk    PATIENT EDUCATION: Education details: Pt educated throughout session about proper posture and technique with exercises. Improved exercise technique, movement at target joints, use of target muscles after min to mod verbal, visual, tactile cues.  Person educated: Patient Education method: Explanation, Demonstration, Tactile cues, and Verbal cues Education comprehension: verbalized understanding, returned demonstration, verbal cues required, and tactile cues required  HOME EXERCISE PROGRAM: Access Code: [email or web address] URL: [email or web address] Date: 06/03/2022 Prepared by: Precious Bard  Exercises - Seated Heel Toe Raises  - 1 x daily - 7 x weekly - 2 sets - 10 reps - 5 hold - Standing Tandem Balance with Counter Support  - 1 x daily - 7 x weekly - 2 sets - 2 reps - 30 hold - Standing March with Counter Support  - 1 x daily - 7 x weekly - 2 sets - 10 reps - 5 hold   GOALS: Goals reviewed with patient? Yes   SHORT TERM GOALS: Target date: 07/01/2022  Patient will be independent in home exercise program to improve strength/mobility for better functional independence with ADLs. Baseline:10/9; HEP given 2/21: HEP compliant  Goal status: MET   LONG TERM GOALS: Target date: 02/03/2023  Patient will increase FOTO score to equal to or greater than   63%  to demonstrate statistically significant improvement in mobility and quality of life.  Baseline: 10/9: 53% 11/29: 58% 12/27: 51% 1/17: 56% 2/21: 53% 3/18: 60%  4/8 58%  Goal status: IN PROGRESS  2.  Patient (< 56 years old) will complete five times sit to stand test in < 10 seconds without UE support indicating an increased LE strength and improved balance. Baseline: 10/9:  19.6 seconds with walking stick; one LOB 11/29: 20.56 seconds 12/27: 13.4 seconds hands on knees 1/17: 13 seconds hands on knees  2/21: 14 seconds no hands 3/18: 12 seconds no hands. 4/8 11.3 sec UE on knees. 5/13: 10.94 sec pushing from thighs.  Goal status: IN PROGRESS  3.  Patient will increase Berg Balance score by > 45/56 to demonstrate decreased fall risk during functional activities. Baseline: 10/9: 33/56 11/29: 81/19 14/78: 29/56 2/13:  08/65 2/21: 46/56 3/18: 48/56  Goal status: MET  +4.  Patient will increase 10 meter walk test to >1.63m/s as to improve gait speed for better community ambulation and to reduce fall risk. Baseline: 11/29: 1.19 m/s Goal status: MET  5.  Patient will increase ABC scale score >80% to demonstrate better functional mobility and better confidence with ADLs.  Baseline: 10/9: 67% 11/29: 75.6% 12/27: 78%  2/21: 78% 3/18: 80% Goal status: MET  6. Patient will tolerate 5 seconds of single leg stance without loss of balance to improve ability to get in and out of shower safely. Baseline: 11/29: unable to stand on single leg safely 12/27: 2 seconds 1/17: 3 seconds 2/21: 3 seconds 3/18: 4 seconds. 4/8: 3 sec  5/13: LLE x 3 sec and RLE x 4 sec  Goal status: IN PROGRESS   6. Patient will increase six minute walk test distance to >1000 for progression to community ambulator and improve gait ability Baseline: 12/27: 845 ft with with walking stick 1/17: 730ft 2/21: 930 ft  3/18: 950 ft 4/8: 1157ft. 5/13: 1185 Goal status: met  7. Patient will increase dynamic gait index score to >19/24 as to demonstrate reduced fall risk and improved dynamic gait balance  for better safety with community/home ambulation.  Baseline: 3/18:  9/24. 4/8: 16/24 5/13: 18/24 Goal status: in Progress   ASSESSMENT:   CLINICAL IMPRESSION: The pt puts forth excellent effort throughout session. Pt demonstrates mild improvement in obstacle navigation in SLS throughout session with min cues for  safety and adequate step length and decreased step width. The pt will continue to benefit from skilled PT to make progress in BLE strength, balance, and coordination to return to PLOF and improve QoL.   OBJECTIVE IMPAIRMENTS Abnormal gait, decreased activity tolerance, decreased balance, decreased coordination, decreased endurance, decreased mobility, difficulty walking, decreased strength, impaired flexibility, impaired sensation, improper body mechanics, and pain.   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   REHAB POTENTIAL: Good  CLINICAL DECISION MAKING: Evolving/moderate complexity  EVALUATION COMPLEXITY: Moderate  PLAN: PT FREQUENCY: 2x/week  PT DURATION: 12 weeks  PLANNED INTERVENTIONS: Therapeutic exercises, Therapeutic activity, Neuromuscular re-education, Balance training, Gait training, Patient/Family education, Self Care, Joint mobilization, Stair training, Vestibular training, Canalith repositioning, Visual/preceptual remediation/compensation, DME instructions, Dry Needling, Cognitive remediation, Spinal mobilization, Cryotherapy, Moist heat, Manual lymph drainage, Compression bandaging, Taping, Vasopneumatic device, Ultrasound, Manual therapy, and  Re-evaluation  PLAN FOR NEXT SESSION:   BLE strength, continue to progress dynamic balance and dual tasking balance.   Golden Pop PT ,DPT Physical Therapist- [place]  [facility]

## 2023-01-14 ENCOUNTER — Ambulatory Visit: Payer: Medicare Other

## 2023-01-15 ENCOUNTER — Ambulatory Visit: Payer: Medicare Other | Admitting: Physical Therapy

## 2023-01-15 ENCOUNTER — Ambulatory Visit: Payer: Medicare Other

## 2023-01-15 DIAGNOSIS — R262 Difficulty in walking, not elsewhere classified: Secondary | ICD-10-CM

## 2023-01-15 DIAGNOSIS — R278 Other lack of coordination: Secondary | ICD-10-CM

## 2023-01-15 DIAGNOSIS — R269 Unspecified abnormalities of gait and mobility: Secondary | ICD-10-CM

## 2023-01-15 DIAGNOSIS — M6281 Muscle weakness (generalized): Secondary | ICD-10-CM

## 2023-01-15 DIAGNOSIS — R2681 Unsteadiness on feet: Secondary | ICD-10-CM

## 2023-01-15 DIAGNOSIS — R2689 Other abnormalities of gait and mobility: Secondary | ICD-10-CM

## 2023-01-15 NOTE — Therapy (Signed)
OCCUPATIONAL THERAPY NEURO RECERTIFICATION AND PROGRESS NOTE  Reporting period beginning 12/11/22-01/15/23  Patient Name: Gregory Crane MRN: [id number] DOB:July 27, 1971, 52 y.o., male  PCP: Dale Jolley REFERRING PROVIDER: Dale [place]  END OF SESSION:   OT End of Session - 01/15/23 1105       Visit Number 20     Number of Visits 44    Date for OT Re-Evaluation 04/09/23     OT Start Time 1100 a    OT Stop Time 1145 a    OT Time Calculation (min) 45 min     Activity Tolerance Patient tolerated treatment well     Behavior During Therapy WFL for tasks assessed/performed        Past Medical History:  Diagnosis Date   Allergy    Anemia    Bell's palsy    Diabetes mellitus without complication (HCC)    diet controlled   Hypertension    Hypothyroidism    Kidney stones    Pseudotumor cerebri    Stroke [facility])    Past Surgical History:  Procedure Laterality Date   COLONOSCOPY WITH PROPOFOL N/A 03/30/2020   Procedure: COLONOSCOPY WITH PROPOFOL;  Surgeon: Regis Bill, MD;  Location: [facility] ENDOSCOPY;  Service: Endoscopy;  Laterality: N/A;   LOOP RECORDER INSERTION N/A 01/27/2018   Procedure: LOOP RECORDER INSERTION;  Surgeon: Duke Salvia, MD;  Location: [facility] INVASIVE CV LAB;  Service: Cardiovascular;  Laterality: N/A;   LUMBAR PUNCTURE     as child   NO PAST SURGERIES     TEE WITHOUT CARDIOVERSION N/A 01/07/2018   Procedure: TRANSESOPHAGEAL ECHOCARDIOGRAM (TEE);  Surgeon: Antonieta Iba, MD;  Location: [facility] ORS;  Service: Cardiovascular;  Laterality: N/A;   Patient Active Problem List   Diagnosis Date Noted   Unsteady gait 10/05/2022   Steal syndrome of dialysis vascular access (HCC) 05/28/2022   Hydronephrosis, left 05/04/2022   Type II diabetes mellitus with renal manifestations (HCC) 05/04/2022   Leukocytosis 05/04/2022   Pre-op evaluation 04/24/2022   Chest pain 01/13/2022   Diabetic retinopathy associated with diabetes mellitus due to underlying condition  (HCC) 01/13/2022   ESRD on hemodialysis (HCC) 01/13/2022   Deafness in right ear 08/11/2021   Headache 07/07/2021   Hearing loss 07/07/2021   Open wound 01/25/2021   History of colon polyps 10/15/2020   Postoperative hemorrhage involving digestive system following digestive system procedure 09/19/2020   Acute cholecystitis without calculus 09/09/2020   Type 2 diabetes mellitus, with long-term current use of insulin (HCC) 09/09/2020   Cryptogenic stroke (HCC) 07/13/2020   History of loop recorder 07/13/2020   Weakness 06/18/2020   History of 2019 novel coronavirus disease (COVID-19) 05/20/2020   ESRD (end stage renal disease) (HCC) 04/04/2020   Pneumonia due to COVID-19 virus 04/03/2020   AKI (acute kidney injury) (HCC) 04/03/2020   Elevated troponin 04/03/2020   Acquired trigger finger 06/08/2019   Lymphedema 06/08/2019   Anemia 04/17/2019   Swelling of left lower extremity 01/10/2019   Facial droop 04/09/2018   Daytime somnolence 03/30/2018   Carotid artery disease (HCC) 02/03/2018   Intracranial vascular stenosis 09/12/2017   Cough 01/20/2017   Bell's palsy 11/10/2016   History of CVA (cerebrovascular accident) 11/10/2016   Benign localized hyperplasia of prostate with urinary obstruction 10/27/2016   History of nephrolithiasis 10/27/2016   TIA (transient ischemic attack) 10/20/2016   Near syncope 06/23/2016   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   Neuropathy 08/06/2015   Health care maintenance 08/06/2015   Essential hypertension 08/06/2015  Heme positive stool 10/10/2013   Hypothyroidism 10/10/2013   Microalbuminuria 10/10/2013   Hyperlipidemia 10/10/2013   B12 deficiency 10/10/2013   Diabetes (HCC) 07/04/2013   Environmental allergies 07/04/2013   ONSET DATE: May 2023  REFERRING DIAG: CVA  THERAPY DIAG:  Muscle weakness (generalized)  Other lack of coordination  Rationale for Evaluation and Treatment: Rehabilitation  SUBJECTIVE:  SUBJECTIVE STATEMENT: Pt reports  he will have to cancel next week's sessions as he has to go out of town. 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    PRECAUTIONS: Fall and Other: No strengthening exercises greater than 5-10 pounds in left arm.   Dialysis Tues, Th, Sat.  WEIGHT BEARING RESTRICTIONS: No  PAIN:  Are you having pain? No  FALLS: Has patient fallen in last 6 months? No  LIVING ENVIRONMENT: Lives with: alone Lives in: House/apartment Stairs: Yes: Internal: 15 steps; on right going up Has following equipment at home: Single point cane and Walker - 2 wheeled  PLOF: Independent  PATIENT GOALS: Pt reports he wants to have better use of his left hand, be able to do buttons and shoes.   OBJECTIVE:   HAND DOMINANCE: Right  ADLs: Overall ADLs: modified independent with most tasks Transfers/ambulation related to ADLs: Now walking without an assistive device, used cane in the past.  Eating: Independent with self feeding, difficulty with cutting food, requires increased focus and effort. Grooming: Independent UB Dressing: Difficulty with buttons, uses pullovers most of the time  LB Dressing: Difficulty with tying shoes Toileting: Independent Bathing: Independent Tub Shower transfers: Independent Equipment: none  IADLs: Shopping: independent,  uses a cart to push in the store Light housekeeping: modified independent Meal Prep: difficulty with tasks requiring use of left hand, bilateral hand use.   Community mobility: Ambulates without use of assistive device, able to drive Medication management:  Independent Financial management: Independent Handwriting: 90% legible  MOBILITY STATUS: Needs Assist: Pt has progressed with PT, used to use a cane but ambulating without an assistive device but still has balance difficulties.    POSTURE COMMENTS:    Sitting balance: Sits without UE support up to 30 sec  ACTIVITY TOLERANCE: Activity tolerance: limited activity tolerance  FUNCTIONAL OUTCOME MEASURES: FOTO: 53 12/11/22: FOTO: 55 01/15/23: 56  UPPER EXTREMITY ROM:    Active ROM Right eval Left eval  Shoulder flexion [facility]  Shoulder abduction [facility]  Shoulder adduction [facility]  Shoulder extension [facility]  Shoulder internal rotation [facility]  Shoulder external rotation [facility]  Elbow flexion [facility] WFL  Elbow extension [facility]  Wrist flexion [facility] WFL  Wrist extension [facility] WFL  Wrist ulnar deviation [facility] WFL  Wrist radial deviation [facility] WFL  Wrist pronation [facility] WFL  Wrist supination WFL WFL  (Blank rows = not tested)  UPPER EXTREMITY MMT:     MMT Right eval Left eval Left 01/15/23  Shoulder flexion 5/5 3+/5 4+  Shoulder abduction 5/5 3+/5 4+  Shoulder adduction     Shoulder extension     Shoulder internal rotation     Shoulder external rotation     Middle trapezius  Lower trapezius     Elbow flexion 5/5 3+/5 5  Elbow extension 5/5 3+/5 4+  Wrist flexion 5/5 3+/5 4+  Wrist extension 5/5 3+/5 4+  Wrist ulnar deviation 5/5 3+/5 4+  Wrist radial deviation 5/5 3+/5 4+  Wrist pronation 5/5 3+/5 4+  Wrist supination 5/5 3+/5 4+  (Blank rows = not tested)  Opposition of thumb to all digits on right, on left hand opposition to ring finger but cannot demonstrate to small finger.   HAND FUNCTION: Grip strength: Right: 44 lbs; Left: 35 lbs, Lateral pinch: Right: 17 lbs, Left: 16 lbs, 3 point pinch: Right: 14 lbs, Left: 0 lbs, and Tip pinch: Right 2 lbs, Left: 0 lbs 12/23/2022: Grip strength: Right: 50 lbs; Left: 43 lbs, Lateral pinch: Right: 10 lbs, Left: 8 lbs, 3 point  pinch: Right:  9 lbs, Left: 8 lbs, and Tip pinch: Right 8 lbs, Left: 4 lbs 01/15/23: R grip 45 lbs, Left: 34 lbs; R lateral pinch 10 lbs, L 7 lbs; 3 point pinch R: 7 lbs, L 7 lbs; tip pinch R: 8 lbs, L 4 lbs (At dialysis yesterday, pt reports they pulled a lot of fluid and pt was so tired that he almost got sick)  COORDINATION: 9 Hole Peg test: Right: 36 sec; Left: 56 sec 12/23/2022: 9 Hole Peg test: Right: 32 sec; Left: 45 sec 01/15/23: R hand 26 sec, Left: 43 sec  TODAY'S TREATMENT: 01/15/2023                                                                                                                              Therapeutic Ex.: Objective measures taken and goals updated for recert/progress note.  Reviewed and completed reps of intrinsic stretches and strengthening exercises.  Pt able to demo active and active assisted digit abd/add with palm flat on table top, tendon glides, and isometric strengthening for lumbricals using R hand to maintain PIP extension. Provided light resistance with theraputty for digit abd and extension and also demonstrated use of red rubber band to perform the same; good return demo.    PATIENT EDUCATION: Education details: intrinsic strengthening for L hand; progress towards goals Person educated: Patient Education method: Explanation, demo, written handout Education comprehension: verbalized understanding, min vc, tactile cues  HOME EXERCISE PROGRAM: Theraputty, tendon glides, intrinsic strengthening with digit abd/add and lumbrical isometric strengthening.  GOALS: Goals reviewed with patient? Yes  SHORT TERM GOALS: Target date:  02/26/2023   Pt will demonstrate ability to hold and utilize utensils for cutting meat with modified independence. Baseline: difficulty with cutting, stabilizing item with fork for cutting; 01/15/23: pt reports he can cut meat, but it's awkward Goal status: IN PROGRESS  2.  Pt will demonstrate ability to obtain keys from left pocket  without difficult and without dropping.   Baseline: pt struggles with reaching into left pocket to get keys; 5/22: still challenging but can do it without dropping Goal status: IN PROGRESS  LONG TERM GOALS: Target date: 04/09/2023   Pt will demonstrate HEP with modified independence.   Baseline: no current program for left UE. (Has green tband but needs educ on exercises); 01/15/23: using theraputty and has recently begun to focus on L hand intrinsic strengthening  Goal status: IN PROGRESS  2.  Pt will improve left UE coordination by 10 secs on 9 hole peg test to demonstrate shoe tying with modified independence.  Baseline: currently wearing slip on shoes and unable to tie shoes, left 9 hole peg test in 56 sec at eval; 01/15/23: pt reports he can tie shoes, sometimes requires multiple attempts, but can do it.  Often chooses to wear slip ons as this is still challenging; L 9 hole 43 sec Goal status: IN PROGRESS/ partially met  3.  Pt will demonstrate improved coordination in left hand to demonstrate buttoning buttons on clothing with modified independence.  Baseline: difficulty with buttons and has been wearing more pullovers to avoid challenge of buttons; 01/15/23: pt reports still challenging Goal status: IN PROGRESS  4.  Pt will demonstrate improvement in left grip strength by 5# to open jars and containers with modified independence and greater ease.   Baseline: Difficulty with opening jars and containers with use of left hand at eval; 01/15/23: pt reports easier time opening a water bottle top without dicem, but uses dycem to open jars (increased L hand weakness as compared to last progress update, but pt reports increased weakness this week from having excess fluid pulled off him during dialysis and would like to re-measure on a Monday after pt has had a couple days off of dialysis). Goal status: IN PROGRESS  5.  Pt will improve left UE strength by 1 mm grade to hold and hand wash dishes  with modified independence.  Baseline: difficulty with being able to hand wash dishes at eval, difficulty stabilizing dish; 01/15/23: easier with dishes, harder with a heavy pan Goal status: IN PROGRESS/partially met  6.  Pt will demonstrate FOTO score of 59 or greater to show a clinically relevant change in LUE to impact greater independence with daily ADL and IADL tasks at home and in the community.   Baseline: Eval: 53; 12/11/22: 55; 01/15/23: 56 Goal status: IN PROGRESS  ASSESSMENT:  CLINICAL IMPRESSION: Pt seen for 20th visit progress update and recertification visit this date.  Pt's FOTO score has improved slightly, with increase from 53 to 56 at recert.  L hand [facility] skills are improving, noting 13 sec improvement in the L hand on 9 hole peg test from eval to recert.  L hand strength has varied greatly between eval, 10th visit, and 20th visit.  L hand strength measures all noted to be lower today as compared to 10th visit, and some measures lower than eval.  Pt states that he's been using his theraputty, but has been experiencing increased weakness this week as he reports that an excess amount of fluid was pulled from him at dialysis this week.  Pt reports he feels the best on a Monday after he's had a couple days break from dialysis.  L shoulder, wrist, and elbow strength, however, all show good improvement from eval, grossly improving from 3+ to 4+ in most planes, and 5/5 in L bicep.  Pt requests instruction in BUE strengthening for green theraband to maintain this strength, as he does still feel challenged with carrying heavier ADL supplies.   Pt continues to present with limited intrinsic hand strength with increased weakness in  the 4th and 5th digits.  Pt continues to benefit from skilled OT services to increase strength, coordination and hand function for necessary daily tasks.    PERFORMANCE DEFICITS: in functional skills including ADLs, IADLs, coordination, dexterity, sensation, tone, ROM,  strength, flexibility, Fine motor control, balance, endurance, decreased knowledge of use of DME, and UE functional use, cognitive skills including, and psychosocial skills including environmental adaptation, habits, and routines and behaviors.   IMPAIRMENTS: are limiting patient from ADLs, IADLs, and social participation.   CO-MORBIDITIES: has co-morbidities such as CAD, ESRD with dialysis, deafness in right ear, diabetic retinopathy, HTN, neuropathy  that affects occupational performance. Patient will benefit from skilled OT to address above impairments and improve overall function.  MODIFICATION OR ASSISTANCE TO COMPLETE EVALUATION: Min-Moderate modification of tasks or assist with assess necessary to complete an evaluation.  OT OCCUPATIONAL PROFILE AND HISTORY: Comprehensive assessment: Review of records and extensive additional review of physical, cognitive, psychosocial history related to current functional performance.  CLINICAL DECISION MAKING: Moderate - several treatment options, min-mod task modification necessary  REHAB POTENTIAL: Good  EVALUATION COMPLEXITY: Moderate PLAN:  OT FREQUENCY: 2x/week  OT DURATION: 12 weeks  PLANNED INTERVENTIONS: self care/ADL training, therapeutic exercise, therapeutic activity, neuromuscular re-education, manual therapy, balance training, paraffin, moist heat, contrast bath, patient/family education, and DME and/or AE instructions  RECOMMENDED OTHER SERVICES: Pt is being seen by PT currently.   CONSULTED AND AGREED WITH PLAN OF CARE: Patient  PLAN FOR NEXT SESSION: Initiate HEP for grip, pinch and coordination skills.   Danelle Earthly, MS, OTR/L

## 2023-01-15 NOTE — Therapy (Signed)
OUTPATIENT PHYSICAL THERAPY NEURO TREATMENT      Patient Name: Gregory Crane MRN: [id number] DOB:August 30, 1970, 52 y.o., male Today's Date: 01/15/2023  PCP: Dale Archbald MD REFERRING PROVIDER: Dale Oval MD  PT End of Session - 01/15/23 1013     Visit Number 53    Number of Visits 60    Date for PT Re-Evaluation 02/03/23    Authorization Type Medicare    Authorization Time Period 11/11/22-02/03/23    Progress Note Due on Visit 60    PT Start Time 1017    PT Stop Time 1100    PT Time Calculation (min) 43 min    Equipment Utilized During Treatment Gait belt    Activity Tolerance Patient tolerated treatment well;No increased pain    Behavior During Therapy WFL for tasks assessed/performed                        Past Medical History:  Diagnosis Date   Allergy    Anemia    Bell's palsy    Diabetes mellitus without complication (HCC)    diet controlled   Hypertension    Hypothyroidism    Kidney stones    Pseudotumor cerebri    Stroke [facility])    Past Surgical History:  Procedure Laterality Date   COLONOSCOPY WITH PROPOFOL N/A 03/30/2020   Procedure: COLONOSCOPY WITH PROPOFOL;  Surgeon: Regis Bill, MD;  Location: [facility] ENDOSCOPY;  Service: Endoscopy;  Laterality: N/A;   LOOP RECORDER INSERTION N/A 01/27/2018   Procedure: LOOP RECORDER INSERTION;  Surgeon: Duke Salvia, MD;  Location: [facility] INVASIVE CV LAB;  Service: Cardiovascular;  Laterality: N/A;   LUMBAR PUNCTURE     as child   NO PAST SURGERIES     TEE WITHOUT CARDIOVERSION N/A 01/07/2018   Procedure: TRANSESOPHAGEAL ECHOCARDIOGRAM (TEE);  Surgeon: Antonieta Iba, MD;  Location: [facility] ORS;  Service: Cardiovascular;  Laterality: N/A;   Patient Active Problem List   Diagnosis Date Noted   Unsteady gait 10/05/2022   Steal syndrome of dialysis vascular access (HCC) 05/28/2022   Hydronephrosis, left 05/04/2022   Type II diabetes mellitus with renal manifestations (HCC) 05/04/2022    Leukocytosis 05/04/2022   Pre-op evaluation 04/24/2022   Chest pain 01/13/2022   Diabetic retinopathy associated with diabetes mellitus due to underlying condition (HCC) 01/13/2022   ESRD on hemodialysis (HCC) 01/13/2022   Deafness in right ear 08/11/2021   Headache 07/07/2021   Hearing loss 07/07/2021   Open wound 01/25/2021   History of colon polyps 10/15/2020   Postoperative hemorrhage involving digestive system following digestive system procedure 09/19/2020   Acute cholecystitis without calculus 09/09/2020   Type 2 diabetes mellitus, with long-term current use of insulin (HCC) 09/09/2020   Cryptogenic stroke (HCC) 07/13/2020   History of loop recorder 07/13/2020   Weakness 06/18/2020   History of 2019 novel coronavirus disease (COVID-19) 05/20/2020   ESRD (end stage renal disease) (HCC) 04/04/2020   Pneumonia due to COVID-19 virus 04/03/2020   AKI (acute kidney injury) (HCC) 04/03/2020   Elevated troponin 04/03/2020   Acquired trigger finger 06/08/2019   Lymphedema 06/08/2019   Anemia 04/17/2019   Swelling of left lower extremity 01/10/2019   Facial droop 04/09/2018   Daytime somnolence 03/30/2018   Carotid artery disease (HCC) 02/03/2018   Intracranial vascular stenosis 09/12/2017   Cough 01/20/2017   Bell's palsy 11/10/2016   History of CVA (cerebrovascular accident) 11/10/2016   Benign localized hyperplasia of prostate with urinary obstruction 10/27/2016  History of nephrolithiasis 10/27/2016   TIA (transient ischemic attack) 10/20/2016   Near syncope 06/23/2016   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   Neuropathy 08/06/2015   Health care maintenance 08/06/2015   Essential hypertension 08/06/2015   Heme positive stool 10/10/2013   Hypothyroidism 10/10/2013   Microalbuminuria 10/10/2013   Hyperlipidemia 10/10/2013   B12 deficiency 10/10/2013   Diabetes (HCC) 07/04/2013   Environmental allergies 07/04/2013   ONSET DATE: 2-3 years  REFERRING DIAG: Neuropathy  THERAPY  DIAG:  Muscle weakness (generalized)  Other lack of coordination  Difficulty in walking, not elsewhere classified  Other abnormalities of gait and mobility  Abnormality of gait and mobility  Unsteadiness on feet  Rationale for Evaluation and Treatment Rehabilitation  SUBJECTIVE:                                                                                                                                                                                             SUBJECTIVE STATEMENT:  Pt reports no changes, reports no falls, no pain. No other concerns reported. Pt will be out of town next week, missing the next couple of scheduled PT sessions.    Pt accompanied by: self  PERTINENT HISTORY: Patient presents to physical therapy for neuropathy. He underwent banding of his L UE fistula on 05/13/22 and scheduled for additional banding on 06/07/22. 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 Patient reports his balance is very unsteady due to neuropathy in feet, feels weak in LE's.   PAIN:  No LBP, 4/10. Stiffness.   PRECAUTIONS: Fall  WEIGHT BEARING RESTRICTIONS Yes no lifting >5 lb in arm   FALLS: Has patient fallen in last 6 months? No  PLOF: Independent  PATIENT GOALS to be more steady and walk as normally as possible.   OBJECTIVE:   TODAY'S TREATMENT:    TherEx  Nustep level 2>5 x 6 min with cues for consistent RPM through variable resistance.therapeutic rest break upon completion, RPE rated 6/10   Step up/down 5inch step 2 x 8 bil BUE support LLE required greater UE support then RLE Side stepping R and L 2 x 33ft with RTB intermittent UE support on rails.    Blaze pods:  Activity Description: The Blaze Pod Sequence setting was selected to enhance cognitive function and motor skills,  providing a structured environment to follow a specific pattern and improve memory, coordination, and movement sequences.  Activity Setting: sequence   Number of Pods:  4 Cycles/Sets:  3  Duration (Time or Hit  Count):  1 min  Patient Stats  Hits:   22, 26, 19,   Performed first and second bout with light UE support and no UE support on the last bout. CGA provided for all three bouts with 1 instance of min assist to prevent LOB to the L while standing on the LLE   CGA provided from PT throughout session for safety with UE support on rails intermittently as needed to reduce fall risk. Noted to have increased weakness on LLE demonstrating since of fatigue at end of session requiring increased therapeutic rest breaks      PATIENT EDUCATION: Education details: Pt educated throughout session about proper posture and technique with exercises. Improved exercise technique, movement at target joints, use of target muscles after min to mod verbal, visual, tactile cues.  Person educated: Patient Education method: Explanation, Demonstration, Tactile cues, and Verbal cues Education comprehension: verbalized understanding, returned demonstration, verbal cues required, and tactile cues required  HOME EXERCISE PROGRAM: Access Code: [email or web address] URL: [email or web address] Date: 06/03/2022 Prepared by: Precious Bard  Exercises - Seated Heel Toe Raises  - 1 x daily - 7 x weekly - 2 sets - 10 reps - 5 hold - Standing Tandem Balance with Counter Support  - 1 x daily - 7 x weekly - 2 sets - 2 reps - 30 hold - Standing March with Counter Support  - 1 x daily - 7 x weekly - 2 sets - 10 reps - 5 hold   GOALS: Goals reviewed with patient? Yes   SHORT TERM GOALS: Target date: 07/01/2022  Patient will be independent in home exercise program to improve strength/mobility for better functional independence with ADLs. Baseline:10/9; HEP given 2/21: HEP compliant  Goal status: MET   LONG TERM GOALS:  Target date: 02/03/2023  Patient will increase FOTO score to equal to or greater than   63%  to demonstrate statistically significant improvement in mobility and quality of life.  Baseline: 10/9: 53% 11/29: 58% 12/27: 51% 1/17: 56% 2/21: 53% 3/18: 60%  4/8 58%  Goal status: IN PROGRESS  2.  Patient (< 37 years old) will complete five times sit to stand test in < 10 seconds without UE support indicating an increased LE strength and improved balance. Baseline: 10/9: 19.6 seconds with walking stick; one LOB 11/29: 20.56 seconds 12/27: 13.4 seconds hands on knees 1/17: 13 seconds hands on knees  2/21: 14 seconds no hands 3/18: 12 seconds no hands. 4/8 11.3 sec UE on knees. 5/13: 10.94 sec pushing from thighs.  Goal status: IN PROGRESS  3.  Patient will increase Berg Balance score by > 45/56 to demonstrate decreased fall risk during functional activities. Baseline: 10/9: 33/56 11/29: 09/81 19/14: 78/29 5/62:  13/08 2/21: 46/56 3/18: 48/56  Goal status: MET  +4.  Patient will increase 10 meter walk test to >1.71m/s as to improve gait speed for better community ambulation and to reduce fall risk. Baseline: 11/29: 1.19 m/s Goal status: MET  5.  Patient will increase ABC scale score >80% to demonstrate better functional mobility and better confidence with ADLs.  Baseline: 10/9: 67% 11/29: 75.6% 12/27: 78%  2/21: 78% 3/18: 80% Goal status: MET  6. Patient will tolerate 5 seconds of single leg stance without loss of balance to improve ability to get in and out of shower safely. Baseline: 11/29: unable to stand on single leg safely 12/27: 2 seconds 1/17: 3 seconds 2/21: 3 seconds 3/18: 4 seconds. 4/8: 3 sec  5/13: LLE x 3 sec  and RLE x 4 sec  Goal status: IN PROGRESS   6. Patient will increase six minute walk test distance to >1000 for progression to community ambulator and improve gait ability Baseline: 12/27: 845 ft with with walking stick 1/17: 712ft 2/21: 930 ft  3/18: 950 ft 4/8: 1157ft. 5/13:  1185 Goal status: met  7. Patient will increase dynamic gait index score to >19/24 as to demonstrate reduced fall risk and improved dynamic gait balance for better safety with community/home ambulation.  Baseline: 3/18:  9/24. 4/8: 16/24 5/13: 18/24 Goal status: in Progress   ASSESSMENT:   CLINICAL IMPRESSION: The pt puts forth excellent effort throughout session. PT treatment focused on functional strength and balance. Noted to have decreased strength and increased fatigue on the LLE, requiring increased therapeutic rest breaks. Able to perform blaze pod balance tasks with no UE, but min assist from PT intermittently, to reduce fall risk.  The pt will continue to benefit from skilled PT to make progress in BLE strength, balance, and coordination to return to PLOF and improve QoL.   OBJECTIVE IMPAIRMENTS Abnormal gait, decreased activity tolerance, decreased balance, decreased coordination, decreased endurance, decreased mobility, difficulty walking, decreased strength, impaired flexibility, impaired sensation, improper body mechanics, and pain.   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   REHAB POTENTIAL: Good  CLINICAL DECISION MAKING: Evolving/moderate  complexity  EVALUATION COMPLEXITY: Moderate  PLAN: PT FREQUENCY: 2x/week  PT DURATION: 12 weeks  PLANNED INTERVENTIONS: Therapeutic exercises, Therapeutic activity, Neuromuscular re-education, Balance training, Gait training, Patient/Family education, Self Care, Joint mobilization, Stair training, Vestibular training, Canalith repositioning, Visual/preceptual remediation/compensation, DME instructions, Dry Needling, Cognitive remediation, Spinal mobilization, Cryotherapy, Moist heat, Manual lymph drainage, Compression bandaging, Taping, Vasopneumatic device, Ultrasound, Manual therapy, and Re-evaluation  PLAN FOR NEXT SESSION:   BLE strength, continue to progress dynamic balance and dual tasking balance.   Golden Pop PT ,DPT Physical Therapist- [place]  [facility]

## 2023-01-16 ENCOUNTER — Ambulatory Visit: Payer: Medicare Other

## 2023-01-21 ENCOUNTER — Ambulatory Visit: Payer: Medicare Other | Admitting: Physical Therapy

## 2023-01-21 ENCOUNTER — Ambulatory Visit: Payer: Medicare Other | Admitting: Occupational Therapy

## 2023-01-23 ENCOUNTER — Ambulatory Visit: Payer: Medicare Other

## 2023-01-23 ENCOUNTER — Ambulatory Visit: Payer: Medicare Other | Admitting: Physical Therapy

## 2023-01-27 ENCOUNTER — Other Ambulatory Visit: Payer: Self-pay

## 2023-01-27 ENCOUNTER — Ambulatory Visit: Payer: Medicare Other | Attending: Internal Medicine

## 2023-01-27 ENCOUNTER — Ambulatory Visit: Payer: Medicare Other | Admitting: Occupational Therapy

## 2023-01-27 DIAGNOSIS — R269 Unspecified abnormalities of gait and mobility: Secondary | ICD-10-CM | POA: Diagnosis present

## 2023-01-27 DIAGNOSIS — R2689 Other abnormalities of gait and mobility: Secondary | ICD-10-CM | POA: Diagnosis present

## 2023-01-27 DIAGNOSIS — R278 Other lack of coordination: Secondary | ICD-10-CM

## 2023-01-27 DIAGNOSIS — R2681 Unsteadiness on feet: Secondary | ICD-10-CM | POA: Diagnosis present

## 2023-01-27 DIAGNOSIS — M6281 Muscle weakness (generalized): Secondary | ICD-10-CM | POA: Diagnosis present

## 2023-01-27 DIAGNOSIS — R262 Difficulty in walking, not elsewhere classified: Secondary | ICD-10-CM | POA: Diagnosis present

## 2023-01-27 NOTE — Therapy (Signed)
OCCUPATIONAL THERAPY NEURO TREATMENT NOTE  Patient Name: Gregory Crane MRN: [id number] DOB:09-06-1970, 52 y.o., male  PCP: Dale [place] REFERRING PROVIDER: Dale   END OF SESSION:  OT End of Session - 01/27/23 1018     Visit Number 21    Number of Visits 44    Date for OT Re-Evaluation 04/09/23    OT Start Time 1015    OT Stop Time 1100    OT Time Calculation (min) 45 min    Activity Tolerance Patient tolerated treatment well    Behavior During Therapy [facility] for tasks assessed/performed                  Past Medical History:  Diagnosis Date   Allergy    Anemia    Bell's palsy    Diabetes mellitus without complication (HCC)    diet controlled   Hypertension    Hypothyroidism    Kidney stones    Pseudotumor cerebri    Stroke [facility])    Past Surgical History:  Procedure Laterality Date   COLONOSCOPY WITH PROPOFOL N/A 03/30/2020   Procedure: COLONOSCOPY WITH PROPOFOL;  Surgeon: Regis Bill, MD;  Location: [facility] ENDOSCOPY;  Service: Endoscopy;  Laterality: N/A;   LOOP RECORDER INSERTION N/A 01/27/2018   Procedure: LOOP RECORDER INSERTION;  Surgeon: Duke Salvia, MD;  Location: [facility] INVASIVE CV LAB;  Service: Cardiovascular;  Laterality: N/A;   LUMBAR PUNCTURE     as child   NO PAST SURGERIES     TEE WITHOUT CARDIOVERSION N/A 01/07/2018   Procedure: TRANSESOPHAGEAL ECHOCARDIOGRAM (TEE);  Surgeon: Antonieta Iba, MD;  Location: [facility] ORS;  Service: Cardiovascular;  Laterality: N/A;   Patient Active Problem List   Diagnosis Date Noted   Unsteady gait 10/05/2022   Steal syndrome of dialysis vascular access (HCC) 05/28/2022   Hydronephrosis, left 05/04/2022   Type II diabetes mellitus with renal manifestations (HCC) 05/04/2022   Leukocytosis 05/04/2022   Pre-op evaluation 04/24/2022   Chest pain 01/13/2022   Diabetic retinopathy associated with diabetes mellitus due to underlying condition (HCC) 01/13/2022   ESRD on hemodialysis (HCC) 01/13/2022    Deafness in right ear 08/11/2021   Headache 07/07/2021   Hearing loss 07/07/2021   Open wound 01/25/2021   History of colon polyps 10/15/2020   Postoperative hemorrhage involving digestive system following digestive system procedure 09/19/2020   Acute cholecystitis without calculus 09/09/2020   Type 2 diabetes mellitus, with long-term current use of insulin (HCC) 09/09/2020   Cryptogenic stroke (HCC) 07/13/2020   History of loop recorder 07/13/2020   Weakness 06/18/2020   History of 2019 novel coronavirus disease (COVID-19) 05/20/2020   ESRD (end stage renal disease) (HCC) 04/04/2020   Pneumonia due to COVID-19 virus 04/03/2020   AKI (acute kidney injury) (HCC) 04/03/2020   Elevated troponin 04/03/2020   Acquired trigger finger 06/08/2019   Lymphedema 06/08/2019   Anemia 04/17/2019   Swelling of left lower extremity 01/10/2019   Facial droop 04/09/2018   Daytime somnolence 03/30/2018   Carotid artery disease (HCC) 02/03/2018   Intracranial vascular stenosis 09/12/2017   Cough 01/20/2017   Bell's palsy 11/10/2016   History of CVA (cerebrovascular accident) 11/10/2016   Benign localized hyperplasia of prostate with urinary obstruction 10/27/2016   History of nephrolithiasis 10/27/2016   TIA (transient ischemic attack) 10/20/2016   Near syncope 06/23/2016   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   Neuropathy 08/06/2015   Health care maintenance 08/06/2015   Essential hypertension 08/06/2015   Heme positive stool 10/10/2013  Hypothyroidism 10/10/2013   Microalbuminuria 10/10/2013   Hyperlipidemia 10/10/2013   B12 deficiency 10/10/2013   Diabetes (HCC) 07/04/2013   Environmental allergies 07/04/2013   ONSET DATE: May 2023  REFERRING DIAG: CVA  THERAPY DIAG:  Muscle weakness (generalized)  Rationale for Evaluation and Treatment: Rehabilitation  SUBJECTIVE:  SUBJECTIVE STATEMENT: Pt reports he will have to cancel next week's sessions as he has to go out of town. Pt  accompanied by: self  PERTINENT HISTORY: Patient with history of CVA, banding of fistula in left arm with worsening of LUE functional use over time since May 2023, he has been seeing PT for balance deficits.  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    PRECAUTIONS: Fall and Other: No strengthening exercises greater than 5-10 pounds in left arm.   Dialysis Tues, Th, Sat.  WEIGHT BEARING RESTRICTIONS: No  PAIN:  Are you having pain? No  FALLS: Has patient fallen in last 6 months? No  LIVING ENVIRONMENT: Lives with: alone Lives in: House/apartment Stairs: Yes: Internal: 15 steps; on right going up Has following equipment at home: Single point cane and Walker - 2 wheeled  PLOF: Independent  PATIENT GOALS: Pt reports he wants to have better use of his left hand, be able to do buttons and shoes.   OBJECTIVE:   HAND DOMINANCE: Right  ADLs: Overall ADLs: modified independent with most tasks Transfers/ambulation related to ADLs: Now walking without an assistive device, used cane in the past.  Eating: Independent with self feeding, difficulty with cutting food, requires increased focus and effort. Grooming: Independent UB Dressing: Difficulty with buttons, uses pullovers most of the time  LB Dressing: Difficulty with tying shoes Toileting: Independent Bathing: Independent Tub Shower transfers: Independent Equipment: none  IADLs: Shopping: independent,  uses a cart to push in the store Light housekeeping: modified independent Meal Prep: difficulty with tasks requiring use of left hand, bilateral hand use.   Community mobility: Ambulates without use of assistive device, able to drive Medication management: Independent Financial management: Independent Handwriting: 90% legible  MOBILITY STATUS:  Needs Assist: Pt has progressed with PT, used to use a cane but ambulating without an assistive device but still has balance difficulties.    POSTURE COMMENTS:    Sitting balance: Sits without UE support up to 30 sec  ACTIVITY TOLERANCE: Activity tolerance: limited activity tolerance  FUNCTIONAL OUTCOME MEASURES: FOTO: 53 12/11/22: FOTO: 55 01/15/23: 56  UPPER EXTREMITY ROM:    Active ROM Right eval Left eval  Shoulder flexion [facility]  Shoulder abduction [facility]  Shoulder adduction [facility]  Shoulder extension [facility]  Shoulder internal rotation [facility]  Shoulder external rotation [facility]  Elbow flexion [facility] WFL  Elbow extension [facility]  Wrist flexion [facility] WFL  Wrist extension [facility] WFL  Wrist ulnar deviation [facility] WFL  Wrist radial deviation [facility] WFL  Wrist pronation [facility] WFL  Wrist supination WFL WFL  (Blank rows = not tested)  UPPER EXTREMITY MMT:     MMT Right eval Left eval Left 01/15/23  Shoulder flexion 5/5 3+/5 4+  Shoulder abduction 5/5 3+/5 4+  Shoulder adduction     Shoulder extension     Shoulder internal rotation     Shoulder external rotation     Middle trapezius     Lower trapezius     Elbow flexion 5/5 3+/5 5  Elbow extension 5/5 3+/5 4+  Wrist flexion 5/5 3+/5 4+  Wrist extension 5/5 3+/5 4+  Wrist ulnar deviation 5/5 3+/5 4+  Wrist radial deviation 5/5 3+/5 4+  Wrist pronation 5/5 3+/5 4+  Wrist supination 5/5 3+/5 4+  (Blank rows = not tested)  Opposition of thumb to all digits on right, on left hand opposition to ring finger but cannot demonstrate to small finger.   HAND FUNCTION: Grip strength: Right: 44 lbs; Left: 35 lbs, Lateral pinch: Right: 17 lbs, Left: 16 lbs, 3 point pinch: Right: 14 lbs, Left: 0 lbs, and Tip pinch: Right 2 lbs, Left: 0 lbs 12/23/2022: Grip strength: Right: 50 lbs; Left: 43 lbs, Lateral pinch: Right: 10 lbs, Left: 8 lbs, 3 point pinch: Right:  9 lbs, Left: 8 lbs, and Tip pinch: Right 8 lbs, Left: 4 lbs 01/15/23: R grip 45  lbs, Left: 34 lbs; R lateral pinch 10 lbs, L 7 lbs; 3 point pinch R: 7 lbs, L 7 lbs; tip pinch R: 8 lbs, L 4 lbs (At dialysis yesterday, pt reports they pulled a lot of fluid and pt was so tired that he almost got sick)  COORDINATION: 9 Hole Peg test: Right: 36 sec; Left: 56 sec 12/23/2022: 9 Hole Peg test: Right: 32 sec; Left: 45 sec 01/15/23: R hand 26 sec, Left: 43 sec  TODAY'S TREATMENT: 01/27/2023                                                                                                                               Therapeutic Ex.:  Therapeutic Ex.:   Pt. Worked on 2nd through 5th digit extension at the tabletop with holding for resistance. Pt. Performed reps of intrinsic stretching. Pt. performed left gross gripping with a gross grip strengthener. Pt. worked on sustaining grip while grasping pegs and reaching at various heights. The gripper was set to  23.4# of grip strength resistance x's 2 trials. Pt. worked on setting up the board while holding , and storing 3-4 pegs in his hand at a time, and moving them one at a time from the palm to the tip of the 2nd through 5th digits. Pt. Worked on digit flexion using the 1.5# DigiFlex.  Neuromuscular re-ed:  Pt. worked on [facility] skills grasping 1" sticks, and 1/4" collars. Pt. worked on storing the objects in the palm, and translatory skills moving the items from the palm of the hand to the tip of the 2nd digit, and thumb. Pt. Worked on storing the 1/" collars in his left hand while grasping. Pt. worked on removing the pegs using bilateral alternating hand patterns.     PATIENT EDUCATION: Education details: intrinsic strengthening for L hand; progress towards goals Person educated: Patient Education method: Explanation, demo, written handout Education comprehension: verbalized understanding, min vc, tactile cues  HOME EXERCISE PROGRAM: Theraputty, tendon glides, intrinsic strengthening with digit abd/add and lumbrical isometric  strengthening.  GOALS: Goals reviewed with patient? Yes  SHORT TERM GOALS: Target date:  02/26/2023   Pt will demonstrate ability to hold and utilize utensils for cutting meat with modified independence. Baseline: difficulty with cutting, stabilizing item with fork for cutting; 01/15/23: pt reports he can cut meat, but it's awkward Goal status: IN PROGRESS  2.  Pt will demonstrate ability to obtain keys from left pocket without difficult and without dropping.   Baseline: pt struggles with reaching into left pocket to get keys; 5/22: still challenging but can do it without dropping Goal status: IN PROGRESS   LONG TERM GOALS: Target date: 04/09/2023   Pt will demonstrate HEP with modified independence.   Baseline: no current program for left UE. (Has green tband but needs educ on exercises); 01/15/23: using theraputty and has recently begun to focus on L hand intrinsic strengthening  Goal status: IN PROGRESS  2.  Pt will improve left UE coordination by 10 secs on 9 hole peg test to demonstrate shoe tying with modified independence.  Baseline: currently wearing slip on shoes and unable to tie shoes, left 9 hole peg test in 56 sec at eval; 01/15/23: pt reports he can tie shoes, sometimes requires multiple attempts, but can do it.  Often chooses to wear slip ons as this is still challenging; L 9 hole 43 sec Goal status: IN PROGRESS/ partially met  3.  Pt will demonstrate improved coordination in left hand to demonstrate buttoning buttons on clothing with modified independence.  Baseline: difficulty with buttons and has been wearing more pullovers to avoid challenge of buttons; 01/15/23: pt reports still challenging Goal status: IN PROGRESS  4.  Pt will demonstrate improvement in left grip strength by 5# to open jars and containers with modified independence and greater ease.   Baseline: Difficulty with opening jars and containers with use of left hand at eval; 01/15/23: pt reports easier time  opening a water bottle top without dicem, but uses dycem to open jars (increased L hand weakness as compared to last progress update, but pt reports increased weakness this week from having excess fluid pulled off him during dialysis and would like to re-measure on a Monday after pt has had a couple days off of dialysis). Goal status: IN PROGRESS  5.  Pt will improve left UE strength by 1 mm grade to hold and hand wash dishes with modified independence.  Baseline: difficulty with being able to hand wash dishes at eval, difficulty stabilizing dish; 01/15/23: easier with dishes, harder with a heavy pan Goal status: IN PROGRESS/partially met  6.  Pt will demonstrate FOTO score of 59 or greater to show a clinically relevant change in LUE to impact greater independence with daily ADL and IADL tasks at home and in the community.   Baseline: Eval: 53; 12/11/22: 55; 01/15/23: 56 Goal status: IN PROGRESS  ASSESSMENT:  CLINICAL IMPRESSION:  Pt continues to present with limited intrinsic hand strength with increased weakness in the 4th and 5th digits, and requires assist to block the 4th, and 5th digit in PIP extension while flexing the MPs. Pt. has difficulty performing MP flexion while extending the PIPs, and DIPs. Pt. Tolerated the hand grip strengthening well. Pt. Presents with difficulty using the left hand for performing translatory movements with small objects. Pt continues to benefit from skilled OT services to increase strength, coordination and hand function for necessary daily tasks.    PERFORMANCE DEFICITS: in functional skills including ADLs, IADLs, coordination, dexterity, sensation, tone, ROM, strength, flexibility, Fine motor control, balance, endurance, decreased knowledge  of use of DME, and UE functional use, cognitive skills including, and psychosocial skills including environmental adaptation, habits, and routines and behaviors.   IMPAIRMENTS: are limiting patient from ADLs, IADLs, and  social participation.   CO-MORBIDITIES: has co-morbidities such as CAD, ESRD with dialysis, deafness in right ear, diabetic retinopathy, HTN, neuropathy  that affects occupational performance. Patient will benefit from skilled OT to address above impairments and improve overall function.  MODIFICATION OR ASSISTANCE TO COMPLETE EVALUATION: Min-Moderate modification of tasks or assist with assess necessary to complete an evaluation.  OT OCCUPATIONAL PROFILE AND HISTORY: Comprehensive assessment: Review of records and extensive additional review of physical, cognitive, psychosocial history related to current functional performance.  CLINICAL DECISION MAKING: Moderate - several treatment options, min-mod task modification necessary  REHAB POTENTIAL: Good  EVALUATION COMPLEXITY: Moderate PLAN:  OT FREQUENCY: 2x/week  OT DURATION: 12 weeks  PLANNED INTERVENTIONS: self care/ADL training, therapeutic exercise, therapeutic activity, neuromuscular re-education, manual therapy, balance training, paraffin, moist heat, contrast bath, patient/family education, and DME and/or AE instructions  RECOMMENDED OTHER SERVICES: Pt is being seen by PT currently.   CONSULTED AND AGREED WITH PLAN OF CARE: Patient  PLAN FOR NEXT SESSION: Initiate HEP for grip, pinch and coordination skills.   Olegario Messier, MS, OTR/L

## 2023-01-27 NOTE — Therapy (Signed)
OUTPATIENT PHYSICAL THERAPY NEURO TREATMENT      Patient Name: Gregory Crane MRN: [id number] DOB:[birth year], 52 y.o., male Today's Date: 01/27/2023  PCP: Dale [place] MD REFERRING PROVIDER: Dale Mabel MD  PT End of Session - 01/27/23 0931     Visit Number 54    Number of Visits 60    Date for PT Re-Evaluation 02/03/23    Authorization Type Medicare    Authorization Time Period 11/11/22-02/03/23    Progress Note Due on Visit 60    PT Start Time 0929    PT Stop Time 1012    PT Time Calculation (min) 43 min    Equipment Utilized During Treatment Gait belt    Activity Tolerance Patient tolerated treatment well;No increased pain    Behavior During Therapy WFL for tasks assessed/performed                         Past Medical History:  Diagnosis Date   Allergy    Anemia    Bell's palsy    Diabetes mellitus without complication (HCC)    diet controlled   Hypertension    Hypothyroidism    Kidney stones    Pseudotumor cerebri    Stroke [facility])    Past Surgical History:  Procedure Laterality Date   COLONOSCOPY WITH PROPOFOL N/A 03/30/2020   Procedure: COLONOSCOPY WITH PROPOFOL;  Surgeon: Regis Bill, MD;  Location: [facility] ENDOSCOPY;  Service: Endoscopy;  Laterality: N/A;   LOOP RECORDER INSERTION N/A 01/27/2018   Procedure: LOOP RECORDER INSERTION;  Surgeon: Duke Salvia, MD;  Location: [name] INVASIVE CV LAB;  Service: Cardiovascular;  Laterality: N/A;   LUMBAR PUNCTURE     as child   NO PAST SURGERIES     TEE WITHOUT CARDIOVERSION N/A 01/07/2018   Procedure: TRANSESOPHAGEAL ECHOCARDIOGRAM (TEE);  Surgeon: Antonieta Iba, MD;  Location: [facility] ORS;  Service: Cardiovascular;  Laterality: N/A;   Patient Active Problem List   Diagnosis Date Noted   Unsteady gait 10/05/2022   Steal syndrome of dialysis vascular access (HCC) 05/28/2022   Hydronephrosis, left 05/04/2022   Type II diabetes mellitus with renal manifestations (HCC) 05/04/2022    Leukocytosis 05/04/2022   Pre-op evaluation 04/24/2022   Chest pain 01/13/2022   Diabetic retinopathy associated with diabetes mellitus due to underlying condition (HCC) 01/13/2022   ESRD on hemodialysis (HCC) 01/13/2022   Deafness in right ear 08/11/2021   Headache 07/07/2021   Hearing loss 07/07/2021   Open wound 01/25/2021   History of colon polyps 10/15/2020   Postoperative hemorrhage involving digestive system following digestive system procedure 09/19/2020   Acute cholecystitis without calculus 09/09/2020   Type 2 diabetes mellitus, with long-term current use of insulin (HCC) 09/09/2020   Cryptogenic stroke (HCC) 07/13/2020   History of loop recorder 07/13/2020   Weakness 06/18/2020   History of 2019 novel coronavirus disease (COVID-19) 05/20/2020   ESRD (end stage renal disease) (HCC) 04/04/2020   Pneumonia due to COVID-19 virus 04/03/2020   AKI (acute kidney injury) (HCC) 04/03/2020   Elevated troponin 04/03/2020   Acquired trigger finger 06/08/2019   Lymphedema 06/08/2019   Anemia 04/17/2019   Swelling of left lower extremity 01/10/2019   Facial droop 04/09/2018   Daytime somnolence 03/30/2018   Carotid artery disease (HCC) 02/03/2018   Intracranial vascular stenosis 09/12/2017   Cough 01/20/2017   Bell's palsy 11/10/2016   History of CVA (cerebrovascular accident) 11/10/2016   Benign localized hyperplasia of prostate with urinary obstruction  10/27/2016   History of nephrolithiasis 10/27/2016   TIA (transient ischemic attack) 10/20/2016   Near syncope 06/23/2016   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   Neuropathy 08/06/2015   Health care maintenance 08/06/2015   Essential hypertension 08/06/2015   Heme positive stool 10/10/2013   Hypothyroidism 10/10/2013   Microalbuminuria 10/10/2013   Hyperlipidemia 10/10/2013   B12 deficiency 10/10/2013   Diabetes (HCC) 07/04/2013   Environmental allergies 07/04/2013   ONSET DATE: 2-3 years  REFERRING DIAG: Neuropathy  THERAPY  DIAG:  Muscle weakness (generalized)  Other lack of coordination  Difficulty in walking, not elsewhere classified  Rationale for Evaluation and Treatment Rehabilitation  SUBJECTIVE:                                                                                                                                                                                             SUBJECTIVE STATEMENT:  Patient returned back from his trip to [place] yesterday, is fatigued. Used escalators while at the airport.    Pt accompanied by: self  PERTINENT HISTORY: Patient presents to physical therapy for neuropathy. He underwent banding of his L UE fistula on 05/13/22 and scheduled for additional banding on 06/07/22. 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 Patient reports his balance is very unsteady due to neuropathy in feet, feels weak in LE's.   PAIN:  No LBP, 4/10. Stiffness.   PRECAUTIONS: Fall  WEIGHT BEARING RESTRICTIONS Yes no lifting >5 lb in arm   FALLS: Has patient fallen in last 6 months? No  PLOF: Independent  PATIENT GOALS to be more steady and walk as normally as possible.   OBJECTIVE:   TODAY'S TREATMENT:    TherEx    Ambulate 550 ft with 3lb ankle weights; close CGA; cues for foot clearance. ; one near LOB to the left; x2 trials ; second trial carrying weight for carryover to daily task of carrying groceries. (15lb KB)    Neuro Re-ed:  Activity Description: thee pods on the floor, if on the L stop with L, , R stomp with R  Activity Setting:  The Blaze Pod Random setting was chosen to enhance cognitive processing and agility, providing an unpredictable environment to simulate real-world scenarios, and fostering quick reactions and adaptability.   Number of Pods:  3 Cycles/Sets:   3 Duration (Time or Hit Count):  first trial 30 seconds, second trial 60 seconds  Patient Stats   Hits:   first trial 19 hits, second trial 42 hits   Activity  Description: one leg at a time, hit 3 pods on the floor; finger on bar Activity Setting: The Blaze Pod Random setting was chosen to enhance cognitive processing and agility, providing an unpredictable environment to simulate real-world scenarios, and fostering quick reactions and adaptability.   Number of Pods:  3 Cycles/Sets:  1x each LE Duration (Time or Hit Count):  30 seconds   Patient Stats   Hits:   26 hits, 32 hits second trial   Activity Description: two pods on either side of patient in seated on mat table, Right=red, green=left Activity Setting:  The Blaze Pod Random setting was chosen to enhance cognitive processing and agility, providing an unpredictable environment to simulate real-world scenarios, and fostering quick reactions and adaptability.   Number of Pods:  4 Cycles/Sets:  4 Duration (Time or Hit Count):  30 seconds  Patient Stats   Hits:   14 hits, 13 hits, 14 hits, 18 hits   Airex pad: -Lateral step up/down onto airex pad; one hand on bar for support, 10x; x 2 trials   CGA provided from PT throughout session for safety with UE support on rails intermittently as needed to reduce fall risk. Noted to have increased weakness on LLE demonstrating since of fatigue at end of session requiring increased therapeutic rest breaks      PATIENT EDUCATION: Education details: Pt educated throughout session about proper posture and technique with exercises. Improved exercise technique, movement at target joints, use of target muscles after min to mod verbal, visual, tactile cues.  Person educated: Patient Education method: Explanation, Demonstration, Tactile cues, and Verbal cues Education comprehension: verbalized understanding, returned demonstration, verbal cues required, and tactile cues required  HOME  EXERCISE PROGRAM: Access Code: [email or web address] URL: [email or web address] Date: 06/03/2022 Prepared by: Precious Bard  Exercises - Seated Heel Toe Raises  - 1 x daily - 7 x weekly - 2 sets - 10 reps - 5 hold - Standing Tandem Balance with Counter Support  - 1 x daily - 7 x weekly - 2 sets - 2 reps - 30 hold - Standing March with Counter Support  - 1 x daily - 7 x weekly - 2 sets - 10 reps - 5 hold   GOALS: Goals reviewed with patient? Yes   SHORT TERM GOALS: Target date: 07/01/2022  Patient will be independent in home exercise program to improve strength/mobility for better functional independence with ADLs. Baseline:10/9; HEP given 2/21: HEP compliant  Goal status: MET   LONG TERM GOALS: Target date: 02/03/2023  Patient will increase FOTO score to equal to or greater than   63%  to demonstrate statistically significant improvement in mobility and quality of life.  Baseline: 10/9: 53% 11/29: 58% 12/27: 51% 1/17: 56% 2/21: 53% 3/18: 60%  4/8 58%  Goal status: IN PROGRESS  2.  Patient (< 23 years old) will complete five times sit to stand test in < 10 seconds without UE support indicating an increased LE strength and improved balance. Baseline: 10/9: 19.6 seconds with walking stick; one LOB 11/29: 20.56 seconds 12/27: 13.4 seconds hands on knees 1/17: 13 seconds hands on knees  2/21: 14 seconds no hands 3/18: 12 seconds no hands. 4/8 11.3 sec UE on knees. 5/13: 10.94 sec pushing from thighs.  Goal status: IN PROGRESS  3.  Patient will increase Berg Balance score by > 45/56 to demonstrate decreased fall risk during functional activities. Baseline: 10/9: 33/56 11/29: 16/10 96/04: 54/09 8/11:  91/47 2/21: 46/56 3/18: 48/56  Goal status: MET  +  4.  Patient will increase 10 meter walk test to >1.39m/s as to improve gait speed for better community ambulation and to reduce fall risk. Baseline: 11/29: 1.19 m/s Goal status: MET  5.  Patient will increase ABC scale score >80% to  demonstrate better functional mobility and better confidence with ADLs.  Baseline: 10/9: 67% 11/29: 75.6% 12/27: 78%  2/21: 78% 3/18: 80% Goal status: MET  6. Patient will tolerate 5 seconds of single leg stance without loss of balance to improve ability to get in and out of shower safely. Baseline: 11/29: unable to stand on single leg safely 12/27: 2 seconds 1/17: 3 seconds 2/21: 3 seconds 3/18: 4 seconds. 4/8: 3 sec  5/13: LLE x 3 sec and RLE x 4 sec  Goal status: IN PROGRESS   6. Patient will increase six minute walk test distance to >1000 for progression to community ambulator and improve gait ability Baseline: 12/27: 845 ft with with walking stick 1/17: 765ft 2/21: 930 ft  3/18: 950 ft 4/8: 1141ft. 5/13: 1185 Goal status: met  7. Patient will increase dynamic gait index score to >19/24 as to demonstrate reduced fall risk and improved dynamic gait balance for better safety with community/home ambulation.  Baseline: 3/18:  9/24. 4/8: 16/24 5/13: 18/24 Goal status: in Progress   ASSESSMENT:   CLINICAL IMPRESSION: Patient presents with good motivation to physical therapy.  Patient tolerates ambulation with 15lb KB for carryover to iADLs such as carrying groceries. Occasional instability with blaze pods require use of bar for stabilization with longer stands. Reaching in seated position is challenging for visual scan and core activation. As patient fatigues he does have decreased base of support/width of feet when performing dynamic activities. The pt will continue to benefit from skilled PT to make progress in BLE strength, balance, and coordination to return to PLOF and improve QoL.   OBJECTIVE IMPAIRMENTS Abnormal gait, decreased activity tolerance, decreased balance, decreased coordination, decreased endurance, decreased mobility, difficulty walking, decreased strength, impaired flexibility, impaired sensation, improper body mechanics, and pain.   ACTIVITY LIMITATIONS carrying, lifting,  bending, standing, squatting, sleeping, stairs, transfers, bed mobility, bathing, toileting, dressing, locomotion level, and caring for others  PARTICIPATION LIMITATIONS: meal prep, cleaning, laundry, medication management, personal finances, interpersonal relationship, driving, shopping, community activity, and yard work  PERSONAL FACTORS Age, Fitness, Past/current experiences, Time since onset of injury/illness/exacerbation, Transportation, and 3+ comorbidities: CAD, cryptogenic stroke (2021), deafness in R ear, diabetic retinopathy, dialysis, ESRD, COVID, HTN, hypothyroidism, leukocytosis, nephrolithiasis, neuropathy, Pseudotumor cerebri 1986, sepsis, DM type I, DM type II  are also affecting patient's functional outcome.   REHAB POTENTIAL: Good  CLINICAL DECISION MAKING: Evolving/moderate complexity  EVALUATION COMPLEXITY: Moderate  PLAN: PT FREQUENCY: 2x/week  PT DURATION: 12 weeks  PLANNED INTERVENTIONS: Therapeutic exercises, Therapeutic activity, Neuromuscular re-education, Balance training, Gait training, Patient/Family education, Self Care, Joint mobilization, Stair training, Vestibular training, Canalith repositioning, Visual/preceptual remediation/compensation, DME instructions, Dry Needling, Cognitive remediation, Spinal mobilization, Cryotherapy, Moist heat, Manual lymph drainage, Compression bandaging, Taping, Vasopneumatic device, Ultrasound, Manual therapy, and Re-evaluation  PLAN FOR NEXT SESSION:   BLE strength, continue to progress dynamic balance and dual tasking balance.   Precious Bard PT ,DPT Physical Therapist- [place]  [facility] [employer] [facility]

## 2023-01-28 ENCOUNTER — Ambulatory Visit: Payer: Medicare Other

## 2023-01-28 NOTE — Therapy (Signed)
OUTPATIENT PHYSICAL THERAPY NEURO TREATMENT      Patient Name: Gregory Crane MRN: [id number] DOB:1970/11/14, 52 y.o., male Today's Date: 01/29/2023  PCP: Dale Paris MD REFERRING PROVIDER: Dale [name] MD  PT End of Session - 01/29/23 1005     Visit Number 55    Number of Visits 60    Date for PT Re-Evaluation 02/03/23    Authorization Type Medicare    Authorization Time Period 11/11/22-02/03/23    Progress Note Due on Visit 60    PT Start Time 1015    PT Stop Time 1059    PT Time Calculation (min) 44 min    Equipment Utilized During Treatment Gait belt    Activity Tolerance Patient tolerated treatment well;No increased pain    Behavior During Therapy WFL for tasks assessed/performed                          Past Medical History:  Diagnosis Date   Allergy    Anemia    Bell's palsy    Diabetes mellitus without complication (HCC)    diet controlled   Hypertension    Hypothyroidism    Kidney stones    Pseudotumor cerebri    Stroke [facility])    Past Surgical History:  Procedure Laterality Date   COLONOSCOPY WITH PROPOFOL N/A 03/30/2020   Procedure: COLONOSCOPY WITH PROPOFOL;  Surgeon: Regis Bill, MD;  Location: [facility] ENDOSCOPY;  Service: Endoscopy;  Laterality: N/A;   LOOP RECORDER INSERTION N/A 01/27/2018   Procedure: LOOP RECORDER INSERTION;  Surgeon: Duke Salvia, MD;  Location: [facility] INVASIVE CV LAB;  Service: Cardiovascular;  Laterality: N/A;   LUMBAR PUNCTURE     as child   NO PAST SURGERIES     TEE WITHOUT CARDIOVERSION N/A 01/07/2018   Procedure: TRANSESOPHAGEAL ECHOCARDIOGRAM (TEE);  Surgeon: Antonieta Iba, MD;  Location: [facility] ORS;  Service: Cardiovascular;  Laterality: N/A;   Patient Active Problem List   Diagnosis Date Noted   Unsteady gait 10/05/2022   Steal syndrome of dialysis vascular access (HCC) 05/28/2022   Hydronephrosis, left 05/04/2022   Type II diabetes mellitus with renal manifestations (HCC) 05/04/2022    Leukocytosis 05/04/2022   Pre-op evaluation 04/24/2022   Chest pain 01/13/2022   Diabetic retinopathy associated with diabetes mellitus due to underlying condition (HCC) 01/13/2022   ESRD on hemodialysis (HCC) 01/13/2022   Deafness in right ear 08/11/2021   Headache 07/07/2021   Hearing loss 07/07/2021   Open wound 01/25/2021   History of colon polyps 10/15/2020   Postoperative hemorrhage involving digestive system following digestive system procedure 09/19/2020   Acute cholecystitis without calculus 09/09/2020   Type 2 diabetes mellitus, with long-term current use of insulin (HCC) 09/09/2020   Cryptogenic stroke (HCC) 07/13/2020   History of loop recorder 07/13/2020   Weakness 06/18/2020   History of 2019 novel coronavirus disease (COVID-19) 05/20/2020   ESRD (end stage renal disease) (HCC) 04/04/2020   Pneumonia due to COVID-19 virus 04/03/2020   AKI (acute kidney injury) (HCC) 04/03/2020   Elevated troponin 04/03/2020   Acquired trigger finger 06/08/2019   Lymphedema 06/08/2019   Anemia 04/17/2019   Swelling of left lower extremity 01/10/2019   Facial droop 04/09/2018   Daytime somnolence 03/30/2018   Carotid artery disease (HCC) 02/03/2018   Intracranial vascular stenosis 09/12/2017   Cough 01/20/2017   Bell's palsy 11/10/2016   History of CVA (cerebrovascular accident) 11/10/2016   Benign localized hyperplasia of prostate with urinary  obstruction 10/27/2016   History of nephrolithiasis 10/27/2016   TIA (transient ischemic attack) 10/20/2016   Near syncope 06/23/2016   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   Neuropathy 08/06/2015   Health care maintenance 08/06/2015   Essential hypertension 08/06/2015   Heme positive stool 10/10/2013   Hypothyroidism 10/10/2013   Microalbuminuria 10/10/2013   Hyperlipidemia 10/10/2013   B12 deficiency 10/10/2013   Diabetes (HCC) 07/04/2013   Environmental allergies 07/04/2013   ONSET DATE: 2-3 years  REFERRING DIAG: Neuropathy  THERAPY  DIAG:  Muscle weakness (generalized)  Difficulty in walking, not elsewhere classified  Other abnormalities of gait and mobility  Abnormality of gait and mobility  Rationale for Evaluation and Treatment Rehabilitation  SUBJECTIVE:                                                                                                                                                                                             SUBJECTIVE STATEMENT: Patient reports he feels less wobbly today. Had no back pain after last session.    Pt accompanied by: self  PERTINENT HISTORY: Patient presents to physical therapy for neuropathy. He underwent banding of his L UE fistula on 05/13/22 and scheduled for additional banding on 06/07/22. 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 Patient reports his balance is very unsteady due to neuropathy in feet, feels weak in LE's.   PAIN:  No LBP, 4/10. Stiffness.   PRECAUTIONS: Fall  WEIGHT BEARING RESTRICTIONS Yes no lifting >5 lb in arm   FALLS: Has patient fallen in last 6 months? No  PLOF: Independent  PATIENT GOALS to be more steady and walk as normally as possible.   OBJECTIVE:   TODAY'S TREATMENT:    TherEx  Standing with # 5lb ankle weight: CGA for stability  -Hip extension with knee bent (donkey kickback in standing); not letting foot down between reps with B upper extremity support, cueing for neutral hip alignment, upright posture for optimal muscle recruitment, and sequencing, 10x each LE,   -Hip abduction with B upper extremity support, cueing for neutral foot alignment for correct muscle activation, 10x each LE  -Hip flexion with dorsiflexion with B upper extremity support, cueing for body mechanics, speed of muscle recruitment for optimal strengthening and  stabilization 10x each LE; 2 second count ;  -second set perform 10 in a row on one side, then switch to opposite LE; decrease UE support to SUE support  -third set with SUE support, 10x in a row; focus on upright posture.   -  6" step: step up/down with UE support on bar; 10x each side, alternating; focus on gluteal squeeze   Seated: -lateral reach on plinth table; posterior and lateral reach for cone stack, unstack one cone, then rotate and place on opposite side: cross body reach switching hands; 8 cones; patient reports medium/difficult  Laying down on incline:  -df resisted with RTB 10x; 2 sets each side ; cue for decreased compensation with quad   Neuro Re-ed:  6" step: -SUE support step up/down 10x each side with focus on glute squeeze for step negotiation   Six hedgehogs: -diagonal/forward ambulation for change of directions through, then return back walking normal; cues for step negotiation and foot clearance. X2 sets   -progressed to be more challenging with more narrow direction of hedgehogs for 3rd-6 th set ; lateral diagonal stepping with closer set hedgehogs; improved stability with widened BOS      PATIENT EDUCATION: Education details: Pt educated throughout session about proper posture and technique with exercises. Improved exercise technique, movement at target joints, use of target muscles after min to mod verbal, visual, tactile cues.  Person educated: Patient Education method: Explanation, Demonstration, Tactile cues, and Verbal cues Education comprehension: verbalized understanding, returned demonstration, verbal cues required, and tactile cues required  HOME EXERCISE PROGRAM: Access Code: [email or web address] URL: [email or web address] Date: 06/03/2022 Prepared by: Precious Bard  Exercises - Seated Heel Toe Raises  - 1 x daily - 7 x weekly - 2 sets - 10 reps - 5 hold - Standing Tandem Balance with Counter Support  - 1 x daily - 7 x weekly - 2 sets - 2 reps -  30 hold - Standing March with Counter Support  - 1 x daily - 7 x weekly - 2 sets - 10 reps - 5 hold   GOALS: Goals reviewed with patient? Yes   SHORT TERM GOALS: Target date: 07/01/2022  Patient will be independent in home exercise program to improve strength/mobility for better functional independence with ADLs. Baseline:10/9; HEP given 2/21: HEP compliant  Goal status: MET   LONG TERM GOALS: Target date: 02/03/2023  Patient will increase FOTO score to equal to or greater than   63%  to demonstrate statistically significant improvement in mobility and quality of life.  Baseline: 10/9: 53% 11/29: 58% 12/27: 51% 1/17: 56% 2/21: 53% 3/18: 60%  4/8 58%  Goal status: IN PROGRESS  2.  Patient (< 105 years old) will complete five times sit to stand test in < 10 seconds without UE support indicating an increased LE strength and improved balance. Baseline: 10/9: 19.6 seconds with walking stick; one LOB 11/29: 20.56 seconds 12/27: 13.4 seconds hands on knees 1/17: 13 seconds hands on knees  2/21: 14 seconds no hands 3/18: 12 seconds no hands. 4/8 11.3 sec UE on knees. 5/13: 10.94 sec pushing from thighs.  Goal status: IN PROGRESS  3.  Patient will increase Berg Balance score by > 45/56 to demonstrate decreased fall risk during functional activities. Baseline: 10/9: 33/56 11/29: 09/81 19/14: 78/29 5/62:  13/08 2/21: 46/56 3/18: 48/56  Goal status: MET  +4.  Patient will increase 10 meter walk test to >1.47m/s as to improve gait speed for better community ambulation and to reduce fall risk. Baseline: 11/29: 1.19 m/s Goal status: MET  5.  Patient will increase ABC scale score >80% to demonstrate better functional mobility and better confidence with ADLs.  Baseline: 10/9: 67% 11/29: 75.6% 12/27: 78%  2/21: 78% 3/18: 80% Goal status: MET  6. Patient  will tolerate 5 seconds of single leg stance without loss of balance to improve ability to get in and out of shower safely. Baseline: 11/29: unable  to stand on single leg safely 12/27: 2 seconds 1/17: 3 seconds 2/21: 3 seconds 3/18: 4 seconds. 4/8: 3 sec  5/13: LLE x 3 sec and RLE x 4 sec  Goal status: IN PROGRESS   6. Patient will increase six minute walk test distance to >1000 for progression to community ambulator and improve gait ability Baseline: 12/27: 845 ft with with walking stick 1/17: 732ft 2/21: 930 ft  3/18: 950 ft 4/8: 1167ft. 5/13: 1185 Goal status: met  7. Patient will increase dynamic gait index score to >19/24 as to demonstrate reduced fall risk and improved dynamic gait balance for better safety with community/home ambulation.  Baseline: 3/18:  9/24. 4/8: 16/24 5/13: 18/24 Goal status: in Progress   ASSESSMENT:   CLINICAL IMPRESSION: Patient presents with excellent motivation. Use of SPT foot between patient's feet to encourage increased width of BOS.  Patient is more challenged with stabilizing on LLE with slight rotation of R leg when raising leg. Step negotiation added to intervention with break down of body mechanics, patient tolerates well with UE support. Lateral stepping improves with repetition with large forward steps continuing to be an area of progress.   The pt will continue to benefit from skilled PT to make progress in BLE strength, balance, and coordination to return to PLOF and improve QoL.   OBJECTIVE IMPAIRMENTS Abnormal gait, decreased activity tolerance, decreased balance, decreased coordination, decreased endurance, decreased mobility, difficulty walking, decreased strength, impaired flexibility, impaired sensation, improper body mechanics, and pain.   ACTIVITY LIMITATIONS carrying, lifting, bending, standing, squatting, sleeping, stairs, transfers, bed mobility, bathing, toileting, dressing, locomotion level, and caring for others  PARTICIPATION LIMITATIONS: meal prep, cleaning, laundry, medication management, personal finances, interpersonal relationship, driving, shopping, community activity, and  yard work  PERSONAL FACTORS Age, Fitness, Past/current experiences, Time since onset of injury/illness/exacerbation, Transportation, and 3+ comorbidities: CAD, cryptogenic stroke (2021), deafness in R ear, diabetic retinopathy, dialysis, ESRD, COVID, HTN, hypothyroidism, leukocytosis, nephrolithiasis, neuropathy, Pseudotumor cerebri 1986, sepsis, DM type I, DM type II  are also affecting patient's functional outcome.   REHAB POTENTIAL: Good  CLINICAL DECISION MAKING: Evolving/moderate complexity  EVALUATION COMPLEXITY: Moderate  PLAN: PT FREQUENCY: 2x/week  PT DURATION: 12 weeks  PLANNED INTERVENTIONS: Therapeutic exercises, Therapeutic activity, Neuromuscular re-education, Balance training, Gait training, Patient/Family education, Self Care, Joint mobilization, Stair training, Vestibular training, Canalith repositioning, Visual/preceptual remediation/compensation, DME instructions, Dry Needling, Cognitive remediation, Spinal mobilization, Cryotherapy, Moist heat, Manual lymph drainage, Compression bandaging, Taping, Vasopneumatic device, Ultrasound, Manual therapy, and Re-evaluation  PLAN FOR NEXT SESSION:   BLE strength, continue to progress dynamic balance and dual tasking balance.   Precious Bard PT ,DPT Physical Therapist- [place]  [facility]

## 2023-01-29 ENCOUNTER — Ambulatory Visit: Payer: Medicare Other | Admitting: Occupational Therapy

## 2023-01-29 ENCOUNTER — Ambulatory Visit: Payer: Medicare Other

## 2023-01-29 DIAGNOSIS — R2689 Other abnormalities of gait and mobility: Secondary | ICD-10-CM

## 2023-01-29 DIAGNOSIS — M6281 Muscle weakness (generalized): Secondary | ICD-10-CM

## 2023-01-29 DIAGNOSIS — R269 Unspecified abnormalities of gait and mobility: Secondary | ICD-10-CM

## 2023-01-29 DIAGNOSIS — R278 Other lack of coordination: Secondary | ICD-10-CM

## 2023-01-29 DIAGNOSIS — R262 Difficulty in walking, not elsewhere classified: Secondary | ICD-10-CM

## 2023-01-30 ENCOUNTER — Ambulatory Visit: Payer: Medicare Other

## 2023-01-30 NOTE — Therapy (Addendum)
OUTPATIENT PHYSICAL THERAPY NEURO TREATMENT /RECERT     Patient Name: Gregory Crane MRN: [id number] DOB:21-Aug-1971, 52 y.o., male Today's Date: 02/03/2023  PCP: Dale Pennington MD REFERRING PROVIDER: Dale Point Clear MD  PT End of Session - 02/03/23 0936     Visit Number 56    Number of Visits 80    Date for PT Re-Evaluation 04/28/23    Authorization Type Medicare    Authorization Time Period 11/11/22-02/03/23    Progress Note Due on Visit 60    PT Start Time 0930    PT Stop Time 1014    PT Time Calculation (min) 44 min    Equipment Utilized During Treatment Gait belt    Activity Tolerance Patient tolerated treatment well;No increased pain    Behavior During Therapy WFL for tasks assessed/performed                           Past Medical History:  Diagnosis Date   Allergy    Anemia    Bell's palsy    Diabetes mellitus without complication (HCC)    diet controlled   Hypertension    Hypothyroidism    Kidney stones    Pseudotumor cerebri    Stroke [facility])    Past Surgical History:  Procedure Laterality Date   COLONOSCOPY WITH PROPOFOL N/A 03/30/2020   Procedure: COLONOSCOPY WITH PROPOFOL;  Surgeon: Regis Bill, MD;  Location: [facility] ENDOSCOPY;  Service: Endoscopy;  Laterality: N/A;   LOOP RECORDER INSERTION N/A 01/27/2018   Procedure: LOOP RECORDER INSERTION;  Surgeon: Duke Salvia, MD;  Location: [facility] INVASIVE CV LAB;  Service: Cardiovascular;  Laterality: N/A;   LUMBAR PUNCTURE     as child   NO PAST SURGERIES     TEE WITHOUT CARDIOVERSION N/A 01/07/2018   Procedure: TRANSESOPHAGEAL ECHOCARDIOGRAM (TEE);  Surgeon: Antonieta Iba, MD;  Location: [facility] ORS;  Service: Cardiovascular;  Laterality: N/A;   Patient Active Problem List   Diagnosis Date Noted   Unsteady gait 10/05/2022   Steal syndrome of dialysis vascular access (HCC) 05/28/2022   Hydronephrosis, left 05/04/2022   Type II diabetes mellitus with renal manifestations (HCC) 05/04/2022    Leukocytosis 05/04/2022   Pre-op evaluation 04/24/2022   Chest pain 01/13/2022   Diabetic retinopathy associated with diabetes mellitus due to underlying condition (HCC) 01/13/2022   ESRD on hemodialysis (HCC) 01/13/2022   Deafness in right ear 08/11/2021   Headache 07/07/2021   Hearing loss 07/07/2021   Open wound 01/25/2021   History of colon polyps 10/15/2020   Postoperative hemorrhage involving digestive system following digestive system procedure 09/19/2020   Acute cholecystitis without calculus 09/09/2020   Type 2 diabetes mellitus, with long-term current use of insulin (HCC) 09/09/2020   Cryptogenic stroke (HCC) 07/13/2020   History of loop recorder 07/13/2020   Weakness 06/18/2020   History of 2019 novel coronavirus disease (COVID-19) 05/20/2020   ESRD (end stage renal disease) (HCC) 04/04/2020   Pneumonia due to COVID-19 virus 04/03/2020   AKI (acute kidney injury) (HCC) 04/03/2020   Elevated troponin 04/03/2020   Acquired trigger finger 06/08/2019   Lymphedema 06/08/2019   Anemia 04/17/2019   Swelling of left lower extremity 01/10/2019   Facial droop 04/09/2018   Daytime somnolence 03/30/2018   Carotid artery disease (HCC) 02/03/2018   Intracranial vascular stenosis 09/12/2017   Cough 01/20/2017   Bell's palsy 11/10/2016   History of CVA (cerebrovascular accident) 11/10/2016   Benign localized hyperplasia of prostate with  urinary obstruction 10/27/2016   History of nephrolithiasis 10/27/2016   TIA (transient ischemic attack) 10/20/2016   Near syncope 06/23/2016   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   Neuropathy 08/06/2015   Health care maintenance 08/06/2015   Essential hypertension 08/06/2015   Heme positive stool 10/10/2013   Hypothyroidism 10/10/2013   Microalbuminuria 10/10/2013   Hyperlipidemia 10/10/2013   B12 deficiency 10/10/2013   Diabetes (HCC) 07/04/2013   Environmental allergies 07/04/2013   ONSET DATE: 2-3 years  REFERRING DIAG:  Neuropathy  THERAPY DIAG:  Muscle weakness (generalized)  Difficulty in walking, not elsewhere classified  Other abnormalities of gait and mobility  Abnormality of gait and mobility  Rationale for Evaluation and Treatment Rehabilitation  SUBJECTIVE:                                                                                                                                                                                             SUBJECTIVE STATEMENT: Patient reports he is doing well, has been compliant with HEP. No falls or LOB.    Pt accompanied by: self  PERTINENT HISTORY: Patient presents to physical therapy for neuropathy. He underwent banding of his L UE fistula on 05/13/22 and scheduled for additional banding on 06/07/22. 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 Patient reports his balance is very unsteady due to neuropathy in feet, feels weak in LE's.   PAIN:  No LBP, 4/10. Stiffness.   PRECAUTIONS: Fall  WEIGHT BEARING RESTRICTIONS Yes no lifting >5 lb in arm   FALLS: Has patient fallen in last 6 months? No  PLOF: Independent  PATIENT GOALS to be more steady and walk as normally as possible.   OBJECTIVE:   TODAY'S TREATMENT:    TherEx At support bar: -Bring knee to wall 10x lunge position for optimal stretch of calf, each LE, 2 breath static hold -standing heel raises 10x; cues for slow controlled eccentric portion    Seated:  -knee adduction 10x ; hold 3 seconds, focus on slow eccentric release; no back support  - no back support 5lb ankle weights: march with focus on 3 second holds for core activation with eccentric control ; 10 x; 2 sets  Neuro Re-ed:  Airex pad at support bar: -step up to single limb stance on leg: heel toe rocker on airex pad; tactile cues to  prevent knee hyperextension 10x each side ; patient reports is challenging for patient to perform ; x 2 sets each LE; second set holding with knee up 3 seconds  Initiation/termination of ambulation with 5lb ankle weights: -first trial with 150 ft with no termination cues for core activation and quad activation -"red light" green light with sudden initiation/termination of ambulation. 8x86 ft with two seated rest breaks; patient is able to utilize core for termination of movement to reduce LOB.    PATIENT EDUCATION: Education details: Pt educated throughout session about proper posture and technique with exercises. Improved exercise technique, movement at target joints, use of target muscles after min to mod verbal, visual, tactile cues.  Person educated: Patient Education method: Explanation, Demonstration, Tactile cues, and Verbal cues Education comprehension: verbalized understanding, returned demonstration, verbal cues required, and tactile cues required  HOME EXERCISE PROGRAM: Access Code: [email or web address] URL: [email or web address] Date: 06/03/2022 Prepared by: Precious Bard  Exercises - Seated Heel Toe Raises  - 1 x daily - 7 x weekly - 2 sets - 10 reps - 5 hold - Standing Tandem Balance with Counter Support  - 1 x daily - 7 x weekly - 2 sets - 2 reps - 30 hold - Standing March with Counter Support  - 1 x daily - 7 x weekly - 2 sets - 10 reps - 5 hold   GOALS: Goals reviewed with patient? Yes   SHORT TERM GOALS: Target date: 07/01/2022  Patient will be independent in home exercise program to improve strength/mobility for better functional independence with ADLs. Baseline:10/9; HEP given 2/21: HEP compliant  Goal status: MET   LONG TERM GOALS: Target date:04/28/2023    Patient will increase FOTO score to equal to or greater than   63%  to demonstrate statistically significant improvement in mobility and quality of life.  Baseline: 10/9: 53% 11/29: 58% 12/27: 51%  1/17: 56% 2/21: 53% 3/18: 60%  4/8 58%  Goal status: IN PROGRESS  2.  Patient (< 44 years old) will complete five times sit to stand test in < 10 seconds without UE support indicating an increased LE strength and improved balance. Baseline: 10/9: 19.6 seconds with walking stick; one LOB 11/29: 20.56 seconds 12/27: 13.4 seconds hands on knees 1/17: 13 seconds hands on knees  2/21: 14 seconds no hands 3/18: 12 seconds no hands. 4/8 11.3 sec UE on knees. 5/13: 10.94 sec pushing from thighs.  Goal status: IN PROGRESS  3.  Patient will increase Berg Balance score by > 45/56 to demonstrate decreased fall risk during functional activities. Baseline: 10/9: 33/56 11/29: 16/10 96/04: 54/09 8/11:  91/47 2/21: 46/56 3/18: 48/56  Goal status: MET  +4.  Patient will increase 10 meter walk test to >1.66m/s as to improve gait speed for better community ambulation and to reduce fall risk. Baseline: 11/29: 1.19 m/s Goal status: MET  5.  Patient will increase ABC scale score >80% to demonstrate better functional mobility and better confidence with ADLs.  Baseline: 10/9: 67% 11/29: 75.6% 12/27: 78%  2/21: 78% 3/18: 80% Goal status: MET  6. Patient will tolerate 5 seconds of single leg stance without loss of balance to improve ability to get in and out of shower safely. Baseline: 11/29: unable to stand on single leg safely 12/27: 2 seconds 1/17: 3 seconds 2/21: 3 seconds 3/18: 4 seconds. 4/8: 3 sec  5/13: LLE x 3 sec and RLE x 4 sec  Goal status: IN PROGRESS   6. Patient will increase six minute walk test distance to >1000 for progression to community ambulator and improve gait ability Baseline: 12/27: 845 ft with with walking stick 1/17: 742ft 2/21: 930 ft  3/18: 950  ft 4/8: 1198ft. 5/13: 1185 Goal status: met  7. Patient will increase dynamic gait index score to >19/24 as to demonstrate reduced fall risk and improved dynamic gait balance for better safety with community/home ambulation.  Baseline: 3/18:   9/24. 4/8: 16/24 5/13: 18/24 Goal status: in Progress  8. Patient will increase six minute walk test distance to >1500 for progression to community ambulator and improve gait ability Baseline: . 5/13: 1185 Goal status: NEW/PROGRESSED    ASSESSMENT:   CLINICAL IMPRESSION: Goals performed on 01/06/23, please refer to this note for further details. Patient will re-assess goals again in two weeks period. He has had significant progression and meeting of his goals since last recert as can be seen by progress note goals. Current POC continues to remain appropriate with focus on balance, strength, and mobility. Progression of six minute walk test added to POC to an age norm distance. Patient agreeable to POC at this time and remains highly motivated for progression of care.  Patient is challenged with single limb rocker to single limb stance with hold on airex pad requiring tactile and verbal cueing to decrease knee hyperextension compensatory strategies. He does improve with repetition.   The pt will continue to benefit from skilled PT to make progress in BLE strength, balance, and coordination to return to PLOF and improve QoL.   OBJECTIVE IMPAIRMENTS Abnormal gait, decreased activity tolerance, decreased balance, decreased coordination, decreased endurance, decreased mobility, difficulty walking, decreased strength, impaired flexibility, impaired sensation, improper body mechanics, and pain.   ACTIVITY LIMITATIONS carrying, lifting, bending, standing, squatting, sleeping, stairs, transfers, bed mobility, bathing, toileting, dressing, locomotion level, and caring for others  PARTICIPATION LIMITATIONS: meal prep, cleaning, laundry, medication management, personal finances, interpersonal relationship, driving, shopping, community activity, and yard work  PERSONAL FACTORS Age, Fitness, Past/current experiences, Time since onset of injury/illness/exacerbation, Transportation, and 3+ comorbidities: CAD,  cryptogenic stroke (2021), deafness in R ear, diabetic retinopathy, dialysis, ESRD, COVID, HTN, hypothyroidism, leukocytosis, nephrolithiasis, neuropathy, Pseudotumor cerebri 1986, sepsis, DM type I, DM type II  are also affecting patient's functional outcome.   REHAB POTENTIAL: Good  CLINICAL DECISION MAKING: Evolving/moderate complexity  EVALUATION COMPLEXITY: Moderate  PLAN: PT FREQUENCY: 2x/week  PT DURATION: 12 weeks  PLANNED INTERVENTIONS: Therapeutic exercises, Therapeutic activity, Neuromuscular re-education, Balance training, Gait training, Patient/Family education, Self Care, Joint mobilization, Stair training, Vestibular training, Canalith repositioning, Visual/preceptual remediation/compensation, DME instructions, Dry Needling, Cognitive remediation, Spinal mobilization, Cryotherapy, Moist heat, Manual lymph drainage, Compression bandaging, Taping, Vasopneumatic device, Ultrasound, Manual therapy, and Re-evaluation  PLAN FOR NEXT SESSION:   BLE strength, continue to progress dynamic balance and dual tasking balance.   Precious Bard PT ,DPT Physical Therapist- [place]  [facility]

## 2023-02-03 ENCOUNTER — Ambulatory Visit: Payer: Medicare Other | Admitting: Occupational Therapy

## 2023-02-03 ENCOUNTER — Ambulatory Visit: Payer: Medicare Other

## 2023-02-03 DIAGNOSIS — R262 Difficulty in walking, not elsewhere classified: Secondary | ICD-10-CM

## 2023-02-03 DIAGNOSIS — M6281 Muscle weakness (generalized): Secondary | ICD-10-CM

## 2023-02-03 DIAGNOSIS — R278 Other lack of coordination: Secondary | ICD-10-CM

## 2023-02-03 DIAGNOSIS — R2689 Other abnormalities of gait and mobility: Secondary | ICD-10-CM

## 2023-02-03 DIAGNOSIS — R269 Unspecified abnormalities of gait and mobility: Secondary | ICD-10-CM

## 2023-02-03 NOTE — Addendum Note (Signed)
Addended by: Claudie Fisherman on: 02/03/2023 01:43 PM   Modules accepted: Orders

## 2023-02-04 ENCOUNTER — Ambulatory Visit: Payer: Medicare Other

## 2023-02-05 ENCOUNTER — Ambulatory Visit (INDEPENDENT_AMBULATORY_CARE_PROVIDER_SITE_OTHER): Payer: Medicare Other | Admitting: Internal Medicine

## 2023-02-05 ENCOUNTER — Encounter: Payer: Self-pay | Admitting: Internal Medicine

## 2023-02-05 VITALS — BP 136/78 | HR 76 | Temp 97.9°F | Resp 16 | Ht 72.0 in | Wt 254.0 lb

## 2023-02-05 DIAGNOSIS — E538 Deficiency of other specified B group vitamins: Secondary | ICD-10-CM | POA: Diagnosis not present

## 2023-02-05 DIAGNOSIS — E039 Hypothyroidism, unspecified: Secondary | ICD-10-CM

## 2023-02-05 DIAGNOSIS — E785 Hyperlipidemia, unspecified: Secondary | ICD-10-CM | POA: Diagnosis not present

## 2023-02-05 DIAGNOSIS — E1122 Type 2 diabetes mellitus with diabetic chronic kidney disease: Secondary | ICD-10-CM

## 2023-02-05 DIAGNOSIS — D649 Anemia, unspecified: Secondary | ICD-10-CM

## 2023-02-05 DIAGNOSIS — Z86010 Personal history of colonic polyps: Secondary | ICD-10-CM

## 2023-02-05 DIAGNOSIS — G629 Polyneuropathy, unspecified: Secondary | ICD-10-CM

## 2023-02-05 DIAGNOSIS — R2681 Unsteadiness on feet: Secondary | ICD-10-CM

## 2023-02-05 DIAGNOSIS — E08319 Diabetes mellitus due to underlying condition with unspecified diabetic retinopathy without macular edema: Secondary | ICD-10-CM

## 2023-02-05 DIAGNOSIS — Z8673 Personal history of transient ischemic attack (TIA), and cerebral infarction without residual deficits: Secondary | ICD-10-CM

## 2023-02-05 DIAGNOSIS — I779 Disorder of arteries and arterioles, unspecified: Secondary | ICD-10-CM

## 2023-02-05 DIAGNOSIS — Z992 Dependence on renal dialysis: Secondary | ICD-10-CM

## 2023-02-05 DIAGNOSIS — N183 Chronic kidney disease, stage 3 unspecified: Secondary | ICD-10-CM

## 2023-02-05 DIAGNOSIS — I10 Essential (primary) hypertension: Secondary | ICD-10-CM

## 2023-02-05 DIAGNOSIS — N1330 Unspecified hydronephrosis: Secondary | ICD-10-CM

## 2023-02-05 DIAGNOSIS — Z794 Long term (current) use of insulin: Secondary | ICD-10-CM

## 2023-02-05 MED ORDER — "INSULIN SYRINGE 30G X 5/16"" 0.5 ML MISC": 5 refills | Status: DC

## 2023-02-05 MED ORDER — INSULIN LISPRO 100 UNIT/ML IJ SOLN: INTRAMUSCULAR | 3 refills | Status: DC

## 2023-02-05 NOTE — Progress Notes (Signed)
Subjective:    Patient ID: Gregory Crane, male    DOB: 25-Apr-1971, 52 y.o.   MRN: [id number]  Patient here for  Chief Complaint  Patient presents with   Medical Management of Chronic Issues    HPI Here to follow up regarding diabetes, hypertension and hypercholesterolemia.  Being followed by nephrology for diabetic nephropathy/ESRD - currently on hemodialysis.  Has been working with PT - history of neuropathy - balance and strengthening.  Currently being evaluated for kidney transplant. Needs colonoscopy prior.  Stamina/gait has improved.  Continues with exercise.  No chest pain.  Breathing stable.  No increased cough or congestion.  No abdominal pain reported.  States am sugars 160-170 and pm sugars 130s.  Had discussed - Orthotics.  Insurance did not cover.   Past Medical History:  Diagnosis Date   Allergy    Anemia    Bell's palsy    Diabetes mellitus without complication (HCC)    diet controlled   Hypertension    Hypothyroidism    Kidney stones    Pseudotumor cerebri    Stroke [facility])    Past Surgical History:  Procedure Laterality Date   COLONOSCOPY WITH PROPOFOL N/A 03/30/2020   Procedure: COLONOSCOPY WITH PROPOFOL;  Surgeon: Regis Bill, MD;  Location: [facility] ENDOSCOPY;  Service: Endoscopy;  Laterality: N/A;   LOOP RECORDER INSERTION N/A 01/27/2018   Procedure: LOOP RECORDER INSERTION;  Surgeon: Duke Salvia, MD;  Location: [facility] INVASIVE CV LAB;  Service: Cardiovascular;  Laterality: N/A;   LUMBAR PUNCTURE     as child   NO PAST SURGERIES     TEE WITHOUT CARDIOVERSION N/A 01/07/2018   Procedure: TRANSESOPHAGEAL ECHOCARDIOGRAM (TEE);  Surgeon: Antonieta Iba, MD;  Location: [facility] ORS;  Service: Cardiovascular;  Laterality: N/A;   Family History  Problem Relation Age of Onset   Breast cancer Mother    Diabetes Father    Diabetes Sister    Arthritis Maternal Grandmother    Diabetes Maternal Grandmother    Social History   Socioeconomic History   Marital  status: Widowed    Spouse name: Carollee Herter   Number of children: 2   Years of education: Not on file   Highest education level: Not on file  Occupational History   Not on file  Tobacco Use   Smoking status: Never   Smokeless tobacco: Never  Vaping Use   Vaping Use: Never used  Substance and Sexual Activity   Alcohol use: No    Alcohol/week: 0.0 standard drinks of alcohol    Comment: very rarely (1-2/year)   Drug use: No   Sexual activity: Yes  Other Topics Concern   Not on file  Social History Narrative   Lives at home with wife, independent at baseline.   Social Determinants of Health   Financial Resource Strain: High Risk (06/26/2021)   Overall Financial Resource Strain (CARDIA)    Difficulty of Paying Living Expenses: Hard  Food Insecurity: No Food Insecurity (05/05/2022)   Hunger Vital Sign    Worried About Running Out of Food in the Last Year: Never true    Ran Out of Food in the Last Year: Never true  Transportation Needs: No Transportation Needs (05/05/2022)   PRAPARE - Administrator, Civil Service (Medical): No    Lack of Transportation (Non-Medical): No  Physical Activity: Inactive (09/13/2017)   Exercise Vital Sign    Days of Exercise per Week: 0 days    Minutes of Exercise per Session:  0 min  Stress: Stress Concern Present (09/13/2017)   [employer] of Occupational Health - Occupational Stress Questionnaire    Feeling of Stress : To some extent  Social Connections: Moderately Integrated (09/13/2017)   Social Connection and Isolation Panel [NHANES]    Frequency of Communication with Friends and Family: More than three times a week    Frequency of Social Gatherings with Friends and Family: More than three times a week    Attends Religious Services: More than 4 times per year    Active Member of [employer] or Organizations: No    Attends Banker Meetings: Never    Marital Status: Married     Review of Systems  Constitutional:  Negative  for appetite change and unexpected weight change.  HENT:  Negative for congestion and sinus pressure.   Respiratory:  Negative for cough and chest tightness.        Breathing stable.   Cardiovascular:  Negative for chest pain and palpitations.       Leg swelling improved.   Gastrointestinal:  Negative for abdominal pain, diarrhea, nausea and vomiting.  Genitourinary:  Negative for difficulty urinating and dysuria.  Musculoskeletal:  Negative for joint swelling and myalgias.  Skin:  Negative for color change and rash.  Neurological:  Negative for dizziness and headaches.  Psychiatric/Behavioral:  Negative for agitation and dysphoric mood.        Objective:     BP 136/78   Pulse 76   Temp 97.9 F (36.6 C)   Resp 16   Ht 6' (1.829 m)   Wt 254 lb (115.2 kg)   SpO2 98%   BMI 34.45 kg/m  Wt Readings from Last 3 Encounters:  02/05/23 254 lb (115.2 kg)  10/30/22 251 lb (113.9 kg)  10/04/22 241 lb (109.3 kg)    Physical Exam Constitutional:      General: He is not in acute distress.    Appearance: Normal appearance. He is well-developed.  HENT:     Head: Normocephalic and atraumatic.     Right Ear: External ear normal.     Left Ear: External ear normal.  Eyes:     General: No scleral icterus.       Right eye: No discharge.        Left eye: No discharge.  Cardiovascular:     Rate and Rhythm: Normal rate and regular rhythm.  Pulmonary:     Effort: Pulmonary effort is normal. No respiratory distress.     Breath sounds: Normal breath sounds.  Abdominal:     General: Bowel sounds are normal.     Palpations: Abdomen is soft.     Tenderness: There is no abdominal tenderness.  Musculoskeletal:        General: No swelling or tenderness.     Cervical back: Neck supple. No tenderness.  Lymphadenopathy:     Cervical: No cervical adenopathy.  Skin:    Findings: No erythema or rash.  Neurological:     Mental Status: He is alert.  Psychiatric:        Mood and Affect: Mood  normal.        Behavior: Behavior normal.      Outpatient Encounter Medications as of 02/05/2023  Medication Sig   Accu-Chek FastClix Lancets MISC USE TO CHECK BLOOD SUGAR THREE TIMES DAILY AS DIRECTED   amLODipine (NORVASC) 10 MG tablet TAKE 1 TABLET(10 MG) BY MOUTH DAILY   aspirin (ASPIRIN LOW DOSE) 81 MG EC tablet TAKE 1 TABLET(81  MG) BY MOUTH DAILY   glucose blood (FREESTYLE LITE) test strip Check blood sugars twice a day (Dx. 250.02)   hydrALAZINE (APRESOLINE) 50 MG tablet TAKE 1 TABLET(50 MG) BY MOUTH THREE TIMES DAILY   insulin glargine-yfgn (SEMGLEE, YFGN,) 100 UNIT/ML injection INJECT 10- 15 UNITS INTO THE SKIN DAILY   insulin lispro (HUMALOG) 100 UNIT/ML injection Inject 4-12 units three times daily before meals per sliding scale: If premeal sugar is <130, inject 4 units; if 131-180, inject 6 units; if 181-240, inject 8 units; if 241-300, inject 10 units, if  >300, inject 12 units (max daily dose 36 units)   Insulin Syringe-Needle U-100 (INSULIN SYRINGE .5CC/30GX5/16") 30G X 5/16" 0.5 ML MISC USE AS DIRECTED WITH INSULIN   levothyroxine (SYNTHROID) 175 MCG tablet TAKE 1 TABLET (175 MCG) BY MOUTH ONCE DAILY BEFORE BREAKFAST.   pregabalin (LYRICA) 100 MG capsule TAKE 1 CAPSULE(100 MG) BY MOUTH THREE TIMES DAILY (Patient taking differently: Take 100 mg by mouth daily.)   RENVELA 800 MG tablet Take 800 mg by mouth 3 (three) times daily.   rosuvastatin (CRESTOR) 5 MG tablet Take 5 mg by mouth daily.   [DISCONTINUED] ammonium lactate (AMLACTIN) 12 % cream Apply 1 Application topically as needed for dry skin.   [DISCONTINUED] insulin glargine (LANTUS) 100 UNIT/ML injection Inject 0.1-0.15 mLs (10-15 Units total) into the skin daily.   [DISCONTINUED] insulin lispro (HUMALOG) 100 UNIT/ML injection Inject 4-12 units three times daily before meals per sliding scale: If premeal sugar is <130, inject 4 units; if 131-180, inject 6 units; if 181-240, inject 8 units; if 241-300, inject 10 units, if   >300, inject 12 units (max daily dose 36 units)   [DISCONTINUED] Insulin Syringe-Needle U-100 (INSULIN SYRINGE .5CC/30GX5/16") 30G X 5/16" 0.5 ML MISC USE AS DIRECTED WITH INSULIN   [DISCONTINUED] Syringe/Needle, Disp, (SYRINGE 3CC/25GX1") 25G X 1" 3 ML MISC Use as directed to administer b12 injections.   No facility-administered encounter medications on file as of 02/05/2023.     Lab Results  Component Value Date   WBC 5.9 10/04/2022   HGB 13.4 10/04/2022   HCT 39.6 10/04/2022   PLT 245.0 10/04/2022   GLUCOSE 85 10/04/2022   CHOL 172 10/04/2022   TRIG 140.0 10/04/2022   HDL 36.20 (L) 10/04/2022   LDLDIRECT 107.0 06/26/2021   LDLCALC 108 (H) 10/04/2022   ALT 9 11/18/2022   AST 12 11/18/2022   NA 141 10/04/2022   K 4.0 10/04/2022   CL 99 10/04/2022   CREATININE 4.47 (H) 10/04/2022   BUN 44 (H) 10/04/2022   CO2 28 10/04/2022   TSH 5.02 11/18/2022   PSA 0.48 10/04/2022   INR 1.0 05/04/2022   HGBA1C 6.8 (H) 10/04/2022    CT Renal Stone Study  Result Date: 08/22/2022 CLINICAL DATA:  Left-sided lower back pain EXAM: CT ABDOMEN AND PELVIS WITHOUT CONTRAST TECHNIQUE: Multidetector CT imaging of the abdomen and pelvis was performed following the standard protocol without IV contrast. RADIATION DOSE REDUCTION: This exam was performed according to the departmental dose-optimization program which includes automated exposure control, adjustment of the mA and/or kV according to patient size and/or use of iterative reconstruction technique. COMPARISON:  CT 05/04/2022 FINDINGS: Lower chest: Bronchial wall thickening and mucous plugging in the left greater than right lower lobes with centrilobular micro nodularity. Trace pericardial effusion. Hepatobiliary: Cholecystectomy.  No focal liver abnormality. Pancreas: Fatty atrophy.  No acute inflammatory change. Spleen: Enlarged. Adrenals/Urinary Tract: Normal adrenal glands. Nonspecific perinephric stranding bilaterally, greater on the left. Marked  left hydroureteronephrosis not significantly changed from 05/04/2022. Tiny 1-2 mm stone at the left UVJ (2/88). Unremarkable bladder. Stomach/Bowel: Unremarkable stomach. Normal caliber large and small bowel. Normal appendix. Vascular/Lymphatic: Minimal aorto bi-iliac atherosclerotic calcification. Lymphadenopathy. Reproductive: Unremarkable prostate. Other: Trace free fluid in the pelvis.  No free intraperitoneal air. Musculoskeletal: No acute abnormality. IMPRESSION: Tiny 1-2 mm stone at the left UVJ. Marked left hydroureteronephrosis similar to 05/04/2022. Nonspecific left-greater-than-right perinephric stranding is similar to 05/04/2022. Similar splenomegaly. Small airway infection/inflammation in the lower lobes. Electronically Signed   By: Minerva Fester M.D.   On: 08/22/2022 01:29       Assessment & Plan:  Hyperlipidemia, unspecified hyperlipidemia type Assessment & Plan: Low cholesterol diet and exercise.  Follow lipid panel and liver function tests.    Orders: -     Lipid panel; Future -     Hepatic function panel; Future -     Basic metabolic panel; Future  Type 2 diabetes mellitus with stage 3 chronic kidney disease, with long-term current use of insulin, unspecified whether stage 3a or 3b CKD (HCC) Assessment & Plan: Sees endocrinology. Low carb diet.  Dexcom.  Follow.  Most recent A1c 6.8%.   Orders: -     Hemoglobin A1c; Future -     Insulin Lispro; Inject 4-12 units three times daily before meals per sliding scale: If premeal sugar is <130, inject 4 units; if 131-180, inject 6 units; if 181-240, inject 8 units; if 241-300, inject 10 units, if  >300, inject 12 units (max daily dose 36 units)  Dispense: 30 mL; Refill: 3  Anemia, unspecified type Assessment & Plan: Labs have been followed through dialysis.  Obtain lab results.    Orders: -     CBC with Differential/Platelet; Future -     Ferritin; Future  B12 deficiency Assessment & Plan: Follow  B12.   Orders: -      Vitamin B12; Future  ESRD on hemodialysis [facility]) Assessment & Plan: Continue hemodialysis.  Followed by nephrology.  Follows labs through dialysis.    Essential hypertension Assessment & Plan: Blood pressure has been doing better.  Continue hydralazine and amlodipine.  Follow pressures.  Follow metabolic panel.    Hypothyroidism, unspecified type Assessment & Plan: On thyroid replacement.  Follow tsh. Instructed on importance of taking regularly.    Orders: -     TSH; Future  Bilateral carotid artery disease, unspecified type (HCC) Assessment & Plan: Last carotid ultrasound - <50% bilaterally.  Needs to take statin and keep blood pressure under control.    Diabetic retinopathy associated with diabetes mellitus due to underlying condition, macular edema presence unspecified, unspecified laterality, unspecified retinopathy severity (HCC) Assessment & Plan: Sees endocrinology. Low carb diet.  Dexcom.  Follow.  Most recent A1c 6.8%. continue f/u with ophthalmology.    History of colon polyps Assessment & Plan: Colonoscopy 03/2020 - multiple polyps.  Recommended f/u in 6 months.  Overdue.  Need to schedule f/u.    History of CVA (cerebrovascular accident) Assessment & Plan: Continue aspirin and lipitor.  Continue blood pressure control.      Hydronephrosis, left Assessment & Plan: Recommend - cystoscopy with retrograde pyelography and possible ureteroscopy and stent. Dr Apolinar Junes 10/31/22 - plan f/u renal ultrasound.    Neuropathy Assessment & Plan: Continue lyrica.  Follow.    Unsteady gait Assessment & Plan: Working with PT for strengthening and balance.  PT - recommended orthotics.  Insurance did not cover.   Other orders -  Insulin Syringe; USE AS DIRECTED WITH INSULIN  Dispense: 300 each; Refill: 5     [place], MD

## 2023-02-06 ENCOUNTER — Ambulatory Visit: Payer: Medicare Other

## 2023-02-06 ENCOUNTER — Ambulatory Visit: Payer: Medicare Other | Admitting: Physical Therapy

## 2023-02-06 NOTE — Therapy (Signed)
OUTPATIENT PHYSICAL THERAPY NEURO TREATMENT      Patient Name: Gregory Crane MRN: [id number] DOB:09/21/1970, 52 y.o., male Today's Date: 02/10/2023  PCP: Dale [place] MD REFERRING PROVIDER: Dale Converse MD  PT End of Session - 02/10/23 0930     Visit Number 57    Number of Visits 80    Date for PT Re-Evaluation 04/28/23    Authorization Type Medicare    Authorization Time Period 11/11/22-02/03/23    Authorization - Number of Visits 12    Progress Note Due on Visit 60    PT Start Time 0930    PT Stop Time 1013    PT Time Calculation (min) 43 min    Equipment Utilized During Treatment Gait belt    Activity Tolerance Patient tolerated treatment well;No increased pain    Behavior During Therapy WFL for tasks assessed/performed              Past Medical History:  Diagnosis Date   Allergy    Anemia    Bell's palsy    Diabetes mellitus without complication (HCC)    diet controlled   Hypertension    Hypothyroidism    Kidney stones    Pseudotumor cerebri    Stroke [facility])    Past Surgical History:  Procedure Laterality Date   COLONOSCOPY WITH PROPOFOL N/A 03/30/2020   Procedure: COLONOSCOPY WITH PROPOFOL;  Surgeon: Regis Bill, MD;  Location: [facility] ENDOSCOPY;  Service: Endoscopy;  Laterality: N/A;   LOOP RECORDER INSERTION N/A 01/27/2018   Procedure: LOOP RECORDER INSERTION;  Surgeon: Duke Salvia, MD;  Location: [facility] INVASIVE CV LAB;  Service: Cardiovascular;  Laterality: N/A;   LUMBAR PUNCTURE     as child   NO PAST SURGERIES     TEE WITHOUT CARDIOVERSION N/A 01/07/2018   Procedure: TRANSESOPHAGEAL ECHOCARDIOGRAM (TEE);  Surgeon: Antonieta Iba, MD;  Location: [facility] ORS;  Service: Cardiovascular;  Laterality: N/A;   Patient Active Problem List   Diagnosis Date Noted   Unsteady gait 10/05/2022   Steal syndrome of dialysis vascular access (HCC) 05/28/2022   Hydronephrosis, left 05/04/2022   Type II diabetes mellitus with renal manifestations (HCC)  05/04/2022   Leukocytosis 05/04/2022   Pre-op evaluation 04/24/2022   Chest pain 01/13/2022   Diabetic retinopathy associated with diabetes mellitus due to underlying condition (HCC) 01/13/2022   ESRD on hemodialysis (HCC) 01/13/2022   Deafness in right ear 08/11/2021   Headache 07/07/2021   Hearing loss 07/07/2021   Open wound 01/25/2021   History of colon polyps 10/15/2020   Postoperative hemorrhage involving digestive system following digestive system procedure 09/19/2020   Acute cholecystitis without calculus 09/09/2020   Type 2 diabetes mellitus, with long-term current use of insulin (HCC) 09/09/2020   Cryptogenic stroke (HCC) 07/13/2020   History of loop recorder 07/13/2020   Weakness 06/18/2020   History of 2019 novel coronavirus disease (COVID-19) 05/20/2020   ESRD (end stage renal disease) (HCC) 04/04/2020   Pneumonia due to COVID-19 virus 04/03/2020   AKI (acute kidney injury) (HCC) 04/03/2020   Elevated troponin 04/03/2020   Acquired trigger finger 06/08/2019   Lymphedema 06/08/2019   Anemia 04/17/2019   Swelling of left lower extremity 01/10/2019   Facial droop 04/09/2018   Daytime somnolence 03/30/2018   Carotid artery disease (HCC) 02/03/2018   Intracranial vascular stenosis 09/12/2017   Cough 01/20/2017   Bell's palsy 11/10/2016   History of CVA (cerebrovascular accident) 11/10/2016   Benign localized hyperplasia of prostate with urinary obstruction 10/27/2016  History of nephrolithiasis 10/27/2016   TIA (transient ischemic attack) 10/20/2016   Near syncope 06/23/2016   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   Neuropathy 08/06/2015   Health care maintenance 08/06/2015   Essential hypertension 08/06/2015   Heme positive stool 10/10/2013   Hypothyroidism 10/10/2013   Microalbuminuria 10/10/2013   Hyperlipidemia 10/10/2013   B12 deficiency 10/10/2013   Diabetes (HCC) 07/04/2013   Environmental allergies 07/04/2013   ONSET DATE: 2-3 years  REFERRING DIAG:  Neuropathy  THERAPY DIAG:  Muscle weakness (generalized)  Difficulty in walking, not elsewhere classified  Rationale for Evaluation and Treatment Rehabilitation  SUBJECTIVE:                                                                                                                                                                                             SUBJECTIVE STATEMENT: "Feeling stiff and sore today" Pt reports working on avoiding knee hyperextension.      Pt accompanied by: friend Loraine Leriche)  PERTINENT HISTORY: Patient presents to physical therapy for neuropathy. He underwent banding of his L UE fistula on 05/13/22 and scheduled for additional banding on 06/07/22. 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 Patient reports his balance is very unsteady due to neuropathy in feet, feels weak in LE's.   PAIN:  Denies pain, but reports stiffness   PRECAUTIONS: Fall  WEIGHT BEARING RESTRICTIONS Yes no lifting >5 lb in arm   FALLS: Has patient fallen in last 6 months? No  PLOF: Independent  PATIENT GOALS to be more steady and walk as normally as possible.   OBJECTIVE:   TODAY'S TREATMENT:   Unless otherwise stated, CGA was provided and gait belt donned in order to ensure pt safety  TherEx: (12 min) -2 x 150' ambulation for dynamic warm-up, cues for avoiding L hand clenching  -2 x 30 sec B: wall  lunge position for optimal stretch of calf at support bar. Pt reports strong tightness of L calf.  -2 x 150' amb with 5# AW  TherAct: (8 min) - 4 x 100': walking pushing a cart / transport chair and scanning the walls to simulate grocery shopping task. Assessing for carry over of acceleration/deceleration training and scanning while maintaining adequate foot clearance. Pt required intermittent  cues for picking up L foot during faster gait. Navigation of cart backwards.   NMRE: (18 min) - 3 x 10 hits: The [employer] Random setting was chosen to enhance cognitive processing and agility, providing an unpredictable environment to simulate real-world scenarios,  and fostering quick reactions and adaptability.  Performed on four 6" steps w/ B railing.  Pt reported medium, harder coming down. Cues for weight shift forward  -5 x B step up on 6" step, no UE support. Difficult -5 alt step up, " -standing ball toss with 6.6# blue ball, 5# AW, with pt's friend. Cues to forward step during catch. Pt demonstrated good foot clearance and weight acceptance, although had moments of unsteadiness in the frontal plane  Manual: (2 min) -SPT provided passive stretch to B hamstring and B calf for improved tissue extensibility at end of session  PATIENT EDUCATION: Education details: Pt educated throughout session about proper posture and technique with exercises. Improved exercise technique, movement at target joints, use of target muscles after min to mod verbal, visual, tactile cues.  Person educated: Patient Education method: Explanation, Demonstration, Tactile cues, and Verbal cues Education comprehension: verbalized understanding, returned demonstration, verbal cues required, and tactile cues required  HOME EXERCISE PROGRAM: Access Code: [email or web address] URL: [email or web address] Date: 06/03/2022 Prepared by: Precious Bard  Exercises - Seated Heel Toe Raises  - 1 x daily - 7 x weekly - 2 sets - 10 reps - 5 hold - Standing Tandem Balance with Counter Support  - 1 x daily - 7 x weekly - 2 sets - 2 reps - 30 hold - Standing March with Counter Support  - 1 x daily - 7 x weekly - 2 sets - 10 reps - 5 hold   GOALS: Goals reviewed with patient? Yes   SHORT TERM GOALS: Target date: 07/01/2022  Patient will be independent in home exercise program to improve strength/mobility for better  functional independence with ADLs. Baseline:10/9; HEP given 2/21: HEP compliant  Goal status: MET   LONG TERM GOALS: Target date:04/28/2023    Patient will increase FOTO score to equal to or greater than   63%  to demonstrate statistically significant improvement in mobility and quality of life.  Baseline: 10/9: 53% 11/29: 58% 12/27: 51% 1/17: 56% 2/21: 53% 3/18: 60%  4/8 58%  Goal status: IN PROGRESS  2.  Patient (< 22 years old) will complete five times sit to stand test in < 10 seconds without UE support indicating an increased LE strength and improved balance. Baseline: 10/9: 19.6 seconds with walking stick; one LOB 11/29: 20.56 seconds 12/27: 13.4 seconds hands on knees 1/17: 13 seconds hands on knees  2/21: 14 seconds no hands 3/18: 12 seconds no hands. 4/8 11.3 sec UE on knees. 5/13: 10.94 sec pushing from thighs.  Goal status: IN PROGRESS  3.  Patient will increase Berg Balance score by > 45/56 to demonstrate decreased fall risk during functional activities. Baseline: 10/9: 33/56 11/29: 16/10 96/04: 54/09 8/11:  91/47 2/21: 46/56 3/18: 48/56  Goal status: MET  +4.  Patient will increase 10 meter walk test to >1.62m/s as to improve gait speed for better community ambulation and to reduce fall risk. Baseline: 11/29: 1.19 m/s Goal status: MET  5.  Patient will increase ABC scale score >80% to demonstrate better functional mobility and better confidence with ADLs.  Baseline: 10/9: 67% 11/29: 75.6% 12/27: 78%  2/21: 78% 3/18: 80% Goal status: MET  6. Patient will tolerate 5 seconds of single leg stance without loss of balance to improve ability to get in and out of shower safely. Baseline: 11/29: unable to stand on single leg safely 12/27: 2 seconds 1/17: 3 seconds 2/21: 3 seconds 3/18: 4 seconds. 4/8: 3 sec  5/13: LLE x 3  sec and RLE x 4 sec  Goal status: IN PROGRESS   6. Patient will increase six minute walk test distance to >1000 for progression to community ambulator and improve  gait ability Baseline: 12/27: 845 ft with with walking stick 1/17: 715ft 2/21: 930 ft  3/18: 950 ft 4/8: 1119ft. 5/13: 1185 Goal status: met  7. Patient will increase dynamic gait index score to >19/24 as to demonstrate reduced fall risk and improved dynamic gait balance for better safety with community/home ambulation.  Baseline: 3/18:  9/24. 4/8: 16/24 5/13: 18/24 Goal status: in Progress  8. Patient will increase six minute walk test distance to >1500 for progression to community ambulator and improve gait ability Baseline: . 5/13: 1185 Goal status: NEW/PROGRESSED    ASSESSMENT:   CLINICAL IMPRESSION: Pt presents to skilled PT with his friend, Loraine Leriche, and reports good energy despite feeling stiff. Therex was progressed today to challenge pt's stepping strategy and foot clearance including stairs and simulated grocery cart activity. During weighted ball toss with friend, pt's stepping strategy improved although SPT noticed moments of instability when foot returned to midline. Pt would also benefit from dry needling to L calf, which was very tight and had palpable trigger points along the lateral gastroc head. The pt will continue to benefit from skilled PT to make progress in BLE strength, balance, and coordination to return to PLOF and improve QoL.    OBJECTIVE IMPAIRMENTS Abnormal gait, decreased activity tolerance, decreased balance, decreased coordination, decreased endurance, decreased mobility, difficulty walking, decreased strength, impaired flexibility, impaired sensation, improper body mechanics, and pain.   ACTIVITY LIMITATIONS carrying, lifting, bending, standing, squatting, sleeping, stairs, transfers, bed mobility, bathing, toileting, dressing, locomotion level, and caring for others  PARTICIPATION LIMITATIONS: meal prep, cleaning, laundry, medication management, personal finances, interpersonal relationship, driving, shopping, community activity, and yard work  PERSONAL  FACTORS Age, Fitness, Past/current experiences, Time since onset of injury/illness/exacerbation, Transportation, and 3+ comorbidities: CAD, cryptogenic stroke (2021), deafness in R ear, diabetic retinopathy, dialysis, ESRD, COVID, HTN, hypothyroidism, leukocytosis, nephrolithiasis, neuropathy, Pseudotumor cerebri 1986, sepsis, DM type I, DM type II  are also affecting patient's functional outcome.   REHAB POTENTIAL: Good  CLINICAL DECISION MAKING: Evolving/moderate complexity  EVALUATION COMPLEXITY: Moderate  PLAN: PT FREQUENCY: 2x/week  PT DURATION: 12 weeks  PLANNED INTERVENTIONS: Therapeutic exercises, Therapeutic activity, Neuromuscular re-education, Balance training, Gait training, Patient/Family education, Self Care, Joint mobilization, Stair training, Vestibular training, Canalith repositioning, Visual/preceptual remediation/compensation, DME instructions, Dry Needling, Cognitive remediation, Spinal mobilization, Cryotherapy, Moist heat, Manual lymph drainage, Compression bandaging, Taping, Vasopneumatic device, Ultrasound, Manual therapy, and Re-evaluation  PLAN FOR NEXT SESSION:   Progression with lateral stepping strategy; squats / lateral lunges with TRX straps  Judith Blonder, SPT   This entire session was performed under direct supervision and direction of a licensed Estate agent . I have personally read, edited and approve of the note as written.  Precious Bard PT ,DPT Physical Therapist- [place]  [facility]

## 2023-02-07 ENCOUNTER — Encounter: Payer: Self-pay | Admitting: Occupational Therapy

## 2023-02-07 NOTE — Therapy (Signed)
OCCUPATIONAL THERAPY NEURO TREATMENT NOTE  Patient Name: Gregory Crane MRN: [id number] DOB:1970-12-13, 52 y.o., male  PCP: Dale [place] REFERRING PROVIDER: Dale [place]  END OF SESSION:  OT End of Session - 02/07/23 1100     Visit Number 22    Number of Visits 44    Date for OT Re-Evaluation 04/09/23    OT Start Time 1101    OT Stop Time 1145    OT Time Calculation (min) 44 min    Activity Tolerance Patient tolerated treatment well    Behavior During Therapy WFL for tasks assessed/performed                  Past Medical History:  Diagnosis Date   Allergy    Anemia    Bell's palsy    Diabetes mellitus without complication (HCC)    diet controlled   Hypertension    Hypothyroidism    Kidney stones    Pseudotumor cerebri    Stroke [facility])    Past Surgical History:  Procedure Laterality Date   COLONOSCOPY WITH PROPOFOL N/A 03/30/2020   Procedure: COLONOSCOPY WITH PROPOFOL;  Surgeon: Regis Bill, MD;  Location: [facility] ENDOSCOPY;  Service: Endoscopy;  Laterality: N/A;   LOOP RECORDER INSERTION N/A 01/27/2018   Procedure: LOOP RECORDER INSERTION;  Surgeon: Duke Salvia, MD;  Location: [facility] INVASIVE CV LAB;  Service: Cardiovascular;  Laterality: N/A;   LUMBAR PUNCTURE     as child   NO PAST SURGERIES     TEE WITHOUT CARDIOVERSION N/A 01/07/2018   Procedure: TRANSESOPHAGEAL ECHOCARDIOGRAM (TEE);  Surgeon: Antonieta Iba, MD;  Location: [facility] ORS;  Service: Cardiovascular;  Laterality: N/A;   Patient Active Problem List   Diagnosis Date Noted   Unsteady gait 10/05/2022   Steal syndrome of dialysis vascular access (HCC) 05/28/2022   Hydronephrosis, left 05/04/2022   Type II diabetes mellitus with renal manifestations (HCC) 05/04/2022   Leukocytosis 05/04/2022   Pre-op evaluation 04/24/2022   Chest pain 01/13/2022   Diabetic retinopathy associated with diabetes mellitus due to underlying condition (HCC) 01/13/2022   ESRD on hemodialysis (HCC) 01/13/2022    Deafness in right ear 08/11/2021   Headache 07/07/2021   Hearing loss 07/07/2021   Open wound 01/25/2021   History of colon polyps 10/15/2020   Postoperative hemorrhage involving digestive system following digestive system procedure 09/19/2020   Acute cholecystitis without calculus 09/09/2020   Type 2 diabetes mellitus, with long-term current use of insulin (HCC) 09/09/2020   Cryptogenic stroke (HCC) 07/13/2020   History of loop recorder 07/13/2020   Weakness 06/18/2020   History of 2019 novel coronavirus disease (COVID-19) 05/20/2020   ESRD (end stage renal disease) (HCC) 04/04/2020   Pneumonia due to COVID-19 virus 04/03/2020   AKI (acute kidney injury) (HCC) 04/03/2020   Elevated troponin 04/03/2020   Acquired trigger finger 06/08/2019   Lymphedema 06/08/2019   Anemia 04/17/2019   Swelling of left lower extremity 01/10/2019   Facial droop 04/09/2018   Daytime somnolence 03/30/2018   Carotid artery disease (HCC) 02/03/2018   Intracranial vascular stenosis 09/12/2017   Cough 01/20/2017   Bell's palsy 11/10/2016   History of CVA (cerebrovascular accident) 11/10/2016   Benign localized hyperplasia of prostate with urinary obstruction 10/27/2016   History of nephrolithiasis 10/27/2016   TIA (transient ischemic attack) 10/20/2016   Near syncope 06/23/2016   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   Neuropathy 08/06/2015   Health care maintenance 08/06/2015   Essential hypertension 08/06/2015   Heme positive stool 10/10/2013  Hypothyroidism 10/10/2013   Microalbuminuria 10/10/2013   Hyperlipidemia 10/10/2013   B12 deficiency 10/10/2013   Diabetes (HCC) 07/04/2013   Environmental allergies 07/04/2013   ONSET DATE: May 2023  REFERRING DIAG: CVA  THERAPY DIAG:  Muscle weakness (generalized)  Other lack of coordination  Rationale for Evaluation and Treatment: Rehabilitation  SUBJECTIVE:  SUBJECTIVE STATEMENT:  Pt reports he visited his friend in [place] and had a good time,  he was able to do dialysis at a center while he was away.   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    PRECAUTIONS: Fall and Other: No strengthening exercises greater than 5-10 pounds in left arm.   Dialysis Tues, Th, Sat.  WEIGHT BEARING RESTRICTIONS: No  PAIN:  Are you having pain? No  FALLS: Has patient fallen in last 6 months? No  LIVING ENVIRONMENT: Lives with: alone Lives in: House/apartment Stairs: Yes: Internal: 15 steps; on right going up Has following equipment at home: Single point cane and Walker - 2 wheeled  PLOF: Independent  PATIENT GOALS: Pt reports he wants to have better use of his left hand, be able to do buttons and shoes.   OBJECTIVE:   HAND DOMINANCE: Right  ADLs: Overall ADLs: modified independent with most tasks Transfers/ambulation related to ADLs: Now walking without an assistive device, used cane in the past.  Eating: Independent with self feeding, difficulty with cutting food, requires increased focus and effort. Grooming: Independent UB Dressing: Difficulty with buttons, uses pullovers most of the time  LB Dressing: Difficulty with tying shoes Toileting: Independent Bathing: Independent Tub Shower transfers: Independent Equipment: none  IADLs: Shopping: independent,  uses a cart to push in the store Light housekeeping: modified independent Meal Prep: difficulty with tasks requiring use of left hand, bilateral hand use.   Community mobility: Ambulates without use of assistive device, able to drive Medication management: Independent Financial  management: Independent Handwriting: 90% legible  MOBILITY STATUS: Needs Assist: Pt has progressed with PT, used to use a cane but ambulating without an assistive device but still has balance difficulties.    POSTURE COMMENTS:    Sitting balance: Sits without UE support up to 30 sec  ACTIVITY TOLERANCE: Activity tolerance: limited activity tolerance  FUNCTIONAL OUTCOME MEASURES: FOTO: 53 12/11/22: FOTO: 55 01/15/23: 56  UPPER EXTREMITY ROM:    Active ROM Right eval Left eval  Shoulder flexion [facility]  Shoulder abduction [facility]  Shoulder adduction [facility]  Shoulder extension [facility]  Shoulder internal rotation [facility]  Shoulder external rotation [facility]  Elbow flexion [facility] WFL  Elbow extension [facility]  Wrist flexion [facility] WFL  Wrist extension [facility] WFL  Wrist ulnar deviation [facility] WFL  Wrist radial deviation [facility] WFL  Wrist pronation [facility] WFL  Wrist supination WFL WFL  (Blank rows = not tested)  UPPER EXTREMITY MMT:     MMT Right eval Left eval Left 01/15/23  Shoulder flexion 5/5 3+/5 4+  Shoulder abduction 5/5 3+/5 4+  Shoulder adduction     Shoulder extension     Shoulder internal rotation     Shoulder external rotation     Middle  trapezius     Lower trapezius     Elbow flexion 5/5 3+/5 5  Elbow extension 5/5 3+/5 4+  Wrist flexion 5/5 3+/5 4+  Wrist extension 5/5 3+/5 4+  Wrist ulnar deviation 5/5 3+/5 4+  Wrist radial deviation 5/5 3+/5 4+  Wrist pronation 5/5 3+/5 4+  Wrist supination 5/5 3+/5 4+  (Blank rows = not tested)  Opposition of thumb to all digits on right, on left hand opposition to ring finger but cannot demonstrate to small finger.   HAND FUNCTION: Grip strength: Right: 44 lbs; Left: 35 lbs, Lateral pinch: Right: 17 lbs, Left: 16 lbs, 3 point pinch: Right: 14 lbs, Left: 0 lbs, and Tip pinch: Right 2 lbs, Left: 0 lbs 12/23/2022: Grip strength: Right: 50 lbs; Left: 43 lbs, Lateral pinch: Right: 10 lbs, Left: 8 lbs, 3 point pinch: Right:  9 lbs,  Left: 8 lbs, and Tip pinch: Right 8 lbs, Left: 4 lbs 01/15/23: R grip 45 lbs, Left: 34 lbs; R lateral pinch 10 lbs, L 7 lbs; 3 point pinch R: 7 lbs, L 7 lbs; tip pinch R: 8 lbs, L 4 lbs (At dialysis yesterday, pt reports they pulled a lot of fluid and pt was so tired that he almost got sick)  COORDINATION: 9 Hole Peg test: Right: 36 sec; Left: 56 sec 12/23/2022: 9 Hole Peg test: Right: 32 sec; Left: 45 sec 01/15/23: R hand 26 sec, Left: 43 sec  TODAY'S TREATMENT: 01/29/2023                                                                                                                               Therapeutic Ex.: Pt seen for finger strengthening with use of push pins placing in to moderate resistive board, increased difficulty with tip to tip prehension pattern for moderate resistive board, therefore, switched to light resistive board and still with difficulty placing pins with tip to tip prehension.  Able to perform with lateral pinch, added some mild resistance with playing card onto the board and use of lateral pinch.  Removed pins from card and board with reinforced tip to tip prehension pattern.  Grip strengthening tasks with use of Jumbo pegs with hand gripper for sustained gripping patterns, left hand 3rd setting 17#,  4th setting 23# for 15 reps each.  Cues for gripping patterns and hand placement onto gripper.  Neuromuscular re-ed:  Pt seen for coordination skills with use of grooved pegboard, attempts to pick up grooved pegs from circular well and working to place into board, demonstrates difficulty with manipulation and turning to place into board. Cues at times for prehension patterns and attempts to facilitate tip to tip patterns.   Manipulation of coins from flat tabletop surface to place into resistive bank, cues for translatory movements of the hand and using the hand for storage.       PATIENT EDUCATION: Education details: intrinsic strengthening for L hand; progress towards  goals Person  educated: Patient Education method: Explanation, demo, written handout Education comprehension: verbalized understanding, min vc, tactile cues  HOME EXERCISE PROGRAM: Theraputty, tendon glides, intrinsic strengthening with digit abd/add and lumbrical isometric strengthening.  GOALS: Goals reviewed with patient? Yes  SHORT TERM GOALS: Target date:  02/26/2023   Pt will demonstrate ability to hold and utilize utensils for cutting meat with modified independence. Baseline: difficulty with cutting, stabilizing item with fork for cutting; 01/15/23: pt reports he can cut meat, but it's awkward Goal status: IN PROGRESS  2.  Pt will demonstrate ability to obtain keys from left pocket without difficult and without dropping.   Baseline: pt struggles with reaching into left pocket to get keys; 5/22: still challenging but can do it without dropping Goal status: IN PROGRESS   LONG TERM GOALS: Target date: 04/09/2023   Pt will demonstrate HEP with modified independence.   Baseline: no current program for left UE. (Has green tband but needs educ on exercises); 01/15/23: using theraputty and has recently begun to focus on L hand intrinsic strengthening  Goal status: IN PROGRESS  2.  Pt will improve left UE coordination by 10 secs on 9 hole peg test to demonstrate shoe tying with modified independence.  Baseline: currently wearing slip on shoes and unable to tie shoes, left 9 hole peg test in 56 sec at eval; 01/15/23: pt reports he can tie shoes, sometimes requires multiple attempts, but can do it.  Often chooses to wear slip ons as this is still challenging; L 9 hole 43 sec Goal status: IN PROGRESS/ partially met  3.  Pt will demonstrate improved coordination in left hand to demonstrate buttoning buttons on clothing with modified independence.  Baseline: difficulty with buttons and has been wearing more pullovers to avoid challenge of buttons; 01/15/23: pt reports still challenging Goal  status: IN PROGRESS  4.  Pt will demonstrate improvement in left grip strength by 5# to open jars and containers with modified independence and greater ease.   Baseline: Difficulty with opening jars and containers with use of left hand at eval; 01/15/23: pt reports easier time opening a water bottle top without dicem, but uses dycem to open jars (increased L hand weakness as compared to last progress update, but pt reports increased weakness this week from having excess fluid pulled off him during dialysis and would like to re-measure on a Monday after pt has had a couple days off of dialysis). Goal status: IN PROGRESS  5.  Pt will improve left UE strength by 1 mm grade to hold and hand wash dishes with modified independence.  Baseline: difficulty with being able to hand wash dishes at eval, difficulty stabilizing dish; 01/15/23: easier with dishes, harder with a heavy pan Goal status: IN PROGRESS/partially met  6.  Pt will demonstrate FOTO score of 59 or greater to show a clinically relevant change in LUE to impact greater independence with daily ADL and IADL tasks at home and in the community.   Baseline: Eval: 53; 12/11/22: 55; 01/15/23: 56 Goal status: IN PROGRESS  ASSESSMENT:  CLINICAL IMPRESSION: Pt demonstrating difficulty with coordination tasks which require tip to tip prehension patterns and with moving items through the hand.  He demonstrates decreased strength in left hand at digits for resistive activities and often reverts to a lateral pinch pattern for attempts to pick up or use increased force to manage an item.  Continue to work on finger and hand strength and coordination to impact function to perform necessary ADL and IADL  tasks.    PERFORMANCE DEFICITS: in functional skills including ADLs, IADLs, coordination, dexterity, sensation, tone, ROM, strength, flexibility, Fine motor control, balance, endurance, decreased knowledge of use of DME, and UE functional use, cognitive skills  including, and psychosocial skills including environmental adaptation, habits, and routines and behaviors.   IMPAIRMENTS: are limiting patient from ADLs, IADLs, and social participation.   CO-MORBIDITIES: has co-morbidities such as CAD, ESRD with dialysis, deafness in right ear, diabetic retinopathy, HTN, neuropathy  that affects occupational performance. Patient will benefit from skilled OT to address above impairments and improve overall function.  MODIFICATION OR ASSISTANCE TO COMPLETE EVALUATION: Min-Moderate modification of tasks or assist with assess necessary to complete an evaluation.  OT OCCUPATIONAL PROFILE AND HISTORY: Comprehensive assessment: Review of records and extensive additional review of physical, cognitive, psychosocial history related to current functional performance.  CLINICAL DECISION MAKING: Moderate - several treatment options, min-mod task modification necessary  REHAB POTENTIAL: Good  EVALUATION COMPLEXITY: Moderate PLAN:  OT FREQUENCY: 2x/week  OT DURATION: 12 weeks  PLANNED INTERVENTIONS: self care/ADL training, therapeutic exercise, therapeutic activity, neuromuscular re-education, manual therapy, balance training, paraffin, moist heat, contrast bath, patient/family education, and DME and/or AE instructions  RECOMMENDED OTHER SERVICES: Pt is being seen by PT currently.   CONSULTED AND AGREED WITH PLAN OF CARE: Patient  PLAN FOR NEXT SESSION: grip, pinch and coordination skills.   Searcy Miyoshi T Erminia Mcnew, OTR/L, CLT  Tai Syfert, OT 02/07/2023, 11:02 AM

## 2023-02-08 ENCOUNTER — Encounter: Payer: Self-pay | Admitting: Occupational Therapy

## 2023-02-08 NOTE — Therapy (Signed)
OCCUPATIONAL THERAPY NEURO TREATMENT NOTE  Patient Name: Gregory Crane MRN: [id number] DOB:Feb 22, 1971, 52 y.o., male  PCP: Dale Muir REFERRING PROVIDER: Dale Arcola  END OF SESSION:  OT End of Session - 02/08/23 1542     Visit Number 23    Number of Visits 44    Date for OT Re-Evaluation 04/09/23    OT Start Time 1015    OT Stop Time 1100    OT Time Calculation (min) 45 min    Activity Tolerance Patient tolerated treatment well    Behavior During Therapy [facility] for tasks assessed/performed                  Past Medical History:  Diagnosis Date   Allergy    Anemia    Bell's palsy    Diabetes mellitus without complication (HCC)    diet controlled   Hypertension    Hypothyroidism    Kidney stones    Pseudotumor cerebri    Stroke [facility])    Past Surgical History:  Procedure Laterality Date   COLONOSCOPY WITH PROPOFOL N/A 03/30/2020   Procedure: COLONOSCOPY WITH PROPOFOL;  Surgeon: Regis Bill, MD;  Location: [facility] ENDOSCOPY;  Service: Endoscopy;  Laterality: N/A;   LOOP RECORDER INSERTION N/A 01/27/2018   Procedure: LOOP RECORDER INSERTION;  Surgeon: Duke Salvia, MD;  Location: [facility] INVASIVE CV LAB;  Service: Cardiovascular;  Laterality: N/A;   LUMBAR PUNCTURE     as child   NO PAST SURGERIES     TEE WITHOUT CARDIOVERSION N/A 01/07/2018   Procedure: TRANSESOPHAGEAL ECHOCARDIOGRAM (TEE);  Surgeon: Antonieta Iba, MD;  Location: [facility] ORS;  Service: Cardiovascular;  Laterality: N/A;   Patient Active Problem List   Diagnosis Date Noted   Unsteady gait 10/05/2022   Steal syndrome of dialysis vascular access (HCC) 05/28/2022   Hydronephrosis, left 05/04/2022   Type II diabetes mellitus with renal manifestations (HCC) 05/04/2022   Leukocytosis 05/04/2022   Pre-op evaluation 04/24/2022   Chest pain 01/13/2022   Diabetic retinopathy associated with diabetes mellitus due to underlying condition (HCC) 01/13/2022   ESRD on hemodialysis (HCC) 01/13/2022    Deafness in right ear 08/11/2021   Headache 07/07/2021   Hearing loss 07/07/2021   Open wound 01/25/2021   History of colon polyps 10/15/2020   Postoperative hemorrhage involving digestive system following digestive system procedure 09/19/2020   Acute cholecystitis without calculus 09/09/2020   Type 2 diabetes mellitus, with long-term current use of insulin (HCC) 09/09/2020   Cryptogenic stroke (HCC) 07/13/2020   History of loop recorder 07/13/2020   Weakness 06/18/2020   History of 2019 novel coronavirus disease (COVID-19) 05/20/2020   ESRD (end stage renal disease) (HCC) 04/04/2020   Pneumonia due to COVID-19 virus 04/03/2020   AKI (acute kidney injury) (HCC) 04/03/2020   Elevated troponin 04/03/2020   Acquired trigger finger 06/08/2019   Lymphedema 06/08/2019   Anemia 04/17/2019   Swelling of left lower extremity 01/10/2019   Facial droop 04/09/2018   Daytime somnolence 03/30/2018   Carotid artery disease (HCC) 02/03/2018   Intracranial vascular stenosis 09/12/2017   Cough 01/20/2017   Bell's palsy 11/10/2016   History of CVA (cerebrovascular accident) 11/10/2016   Benign localized hyperplasia of prostate with urinary obstruction 10/27/2016   History of nephrolithiasis 10/27/2016   TIA (transient ischemic attack) 10/20/2016   Near syncope 06/23/2016   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   Neuropathy 08/06/2015   Health care maintenance 08/06/2015   Essential hypertension 08/06/2015   Heme positive stool 10/10/2013  Hypothyroidism 10/10/2013   Microalbuminuria 10/10/2013   Hyperlipidemia 10/10/2013   B12 deficiency 10/10/2013   Diabetes (HCC) 07/04/2013   Environmental allergies 07/04/2013   ONSET DATE: May 2023  REFERRING DIAG: CVA  THERAPY DIAG:  Muscle weakness (generalized)  Other lack of coordination  Rationale for Evaluation and Treatment: Rehabilitation  SUBJECTIVE:  SUBJECTIVE STATEMENT:  Pt reports he is doing well.  Was feeling shaky last session but  not sure if he had any blood sugar issues or not, resolved over time that day.  No pain noted this date.  Was able to see his grandchildren this weekend, his step daughter had knee surgery recently.     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    PRECAUTIONS: Fall and Other: No strengthening exercises greater than 5-10 pounds in left arm.   Dialysis Tues, Th, Sat.  WEIGHT BEARING RESTRICTIONS: No  PAIN:  Are you having pain? No  FALLS: Has patient fallen in last 6 months? No  LIVING ENVIRONMENT: Lives with: alone Lives in: House/apartment Stairs: Yes: Internal: 15 steps; on right going up Has following equipment at home: Single point cane and Walker - 2 wheeled  PLOF: Independent  PATIENT GOALS: Pt reports he wants to have better use of his left hand, be able to do buttons and shoes.   OBJECTIVE:   HAND DOMINANCE: Right  ADLs: Overall ADLs: modified independent with most tasks Transfers/ambulation related to ADLs: Now walking without an assistive device, used cane in the past.  Eating: Independent with self feeding, difficulty with cutting food, requires increased focus and effort. Grooming: Independent UB Dressing: Difficulty with buttons, uses pullovers most of the time  LB Dressing: Difficulty with tying shoes Toileting: Independent Bathing: Independent Tub Shower transfers: Independent Equipment: none  IADLs: Shopping: independent,  uses a cart to push in the store Light housekeeping: modified independent Meal Prep: difficulty with tasks requiring use of left hand, bilateral  hand use.   Community mobility: Ambulates without use of assistive device, able to drive Medication management: Independent Financial management: Independent Handwriting: 90% legible  MOBILITY STATUS: Needs Assist: Pt has progressed with PT, used to use a cane but ambulating without an assistive device but still has balance difficulties.    POSTURE COMMENTS:    Sitting balance: Sits without UE support up to 30 sec  ACTIVITY TOLERANCE: Activity tolerance: limited activity tolerance  FUNCTIONAL OUTCOME MEASURES: FOTO: 53 12/11/22: FOTO: 55 01/15/23: 56  UPPER EXTREMITY ROM:    Active ROM Right eval Left eval  Shoulder flexion [facility]  Shoulder abduction [facility]  Shoulder adduction [facility]  Shoulder extension [facility]  Shoulder internal rotation [facility]  Shoulder external rotation [facility]  Elbow flexion [facility]FL  Elbow extension [facility]  Wrist flexion [facility] WFL  Wrist extension [facility] WFL  Wrist ulnar deviation [name] WFL  Wrist radial deviation [facility] WFL  Wrist pronation [facility] WFL  Wrist supination WFL WFL  (Blank rows = not tested)  UPPER EXTREMITY MMT:     MMT Right eval Left eval Left 01/15/23  Shoulder flexion 5/5 3+/5 4+  Shoulder abduction 5/5 3+/5 4+  Shoulder adduction     Shoulder extension     Shoulder internal rotation     Shoulder external rotation     Middle trapezius     Lower trapezius     Elbow flexion 5/5 3+/5 5  Elbow extension 5/5 3+/5 4+  Wrist flexion 5/5 3+/5 4+  Wrist extension 5/5 3+/5 4+  Wrist ulnar deviation 5/5 3+/5 4+  Wrist radial deviation 5/5 3+/5 4+  Wrist pronation 5/5 3+/5 4+  Wrist supination 5/5 3+/5 4+  (Blank rows = not tested)  Opposition of thumb to all digits on right, on left hand opposition to ring finger but cannot demonstrate to small finger.   HAND FUNCTION: Grip strength: Right: 44 lbs; Left: 35 lbs, Lateral pinch: Right: 17 lbs, Left: 16 lbs, 3 point pinch: Right: 14 lbs, Left: 0 lbs, and Tip pinch: Right 2 lbs, Left: 0  lbs 12/23/2022: Grip strength: Right: 50 lbs; Left: 43 lbs, Lateral pinch: Right: 10 lbs, Left: 8 lbs, 3 point pinch: Right:  9 lbs, Left: 8 lbs, and Tip pinch: Right 8 lbs, Left: 4 lbs 01/15/23: R grip 45 lbs, Left: 34 lbs; R lateral pinch 10 lbs, L 7 lbs; 3 point pinch R: 7 lbs, L 7 lbs; tip pinch R: 8 lbs, L 4 lbs (At dialysis yesterday, pt reports they pulled a lot of fluid and pt was so tired that he almost got sick)  COORDINATION: 9 Hole Peg test: Right: 36 sec; Left: 56 sec 12/23/2022: 9 Hole Peg test: Right: 32 sec; Left: 45 sec 01/15/23: R hand 26 sec, Left: 43 sec  TODAY'S TREATMENT: 01/29/2023                                                                                                                               Therapeutic Ex.: Pt seen for left UE strengthening with use of 1# resistive wrist weight, engaging in multidirectional reach with large shapes and 4 level height shape tower.  Occasional rest breaks as needed.  Able to complete all 4 levels of reach from seated position.    Neuromuscular re-ed: Manipulation of large jacks, use of prehension tip to tip pattern with attempts to spin jacks with left hand.   Pt seen for picking up small marbles and moving to palm, using hand for storage and then moving out to fingertip, increased difficulty, can push it to PIP but difficulty getting it moved to tip of finger. Cues for facilitation of tip to tip prehension pattern with manipulation attempts.      PATIENT EDUCATION: Education details: intrinsic strengthening for L hand; progress towards goals Person educated: Patient Education method: Explanation, demo, written handout Education comprehension: verbalized understanding, min vc, tactile cues  HOME EXERCISE PROGRAM: Theraputty, tendon glides, intrinsic strengthening with digit abd/add and lumbrical isometric strengthening.  GOALS: Goals reviewed with patient? Yes  SHORT TERM GOALS: Target date:  02/26/2023   Pt will  demonstrate ability to hold  and utilize utensils for cutting meat with modified independence. Baseline: difficulty with cutting, stabilizing item with fork for cutting; 01/15/23: pt reports he can cut meat, but it's awkward Goal status: IN PROGRESS  2.  Pt will demonstrate ability to obtain keys from left pocket without difficult and without dropping.   Baseline: pt struggles with reaching into left pocket to get keys; 5/22: still challenging but can do it without dropping Goal status: IN PROGRESS   LONG TERM GOALS: Target date: 04/09/2023   Pt will demonstrate HEP with modified independence.   Baseline: no current program for left UE. (Has green tband but needs educ on exercises); 01/15/23: using theraputty and has recently begun to focus on L hand intrinsic strengthening  Goal status: IN PROGRESS  2.  Pt will improve left UE coordination by 10 secs on 9 hole peg test to demonstrate shoe tying with modified independence.  Baseline: currently wearing slip on shoes and unable to tie shoes, left 9 hole peg test in 56 sec at eval; 01/15/23: pt reports he can tie shoes, sometimes requires multiple attempts, but can do it.  Often chooses to wear slip ons as this is still challenging; L 9 hole 43 sec Goal status: IN PROGRESS/ partially met  3.  Pt will demonstrate improved coordination in left hand to demonstrate buttoning buttons on clothing with modified independence.  Baseline: difficulty with buttons and has been wearing more pullovers to avoid challenge of buttons; 01/15/23: pt reports still challenging Goal status: IN PROGRESS  4.  Pt will demonstrate improvement in left grip strength by 5# to open jars and containers with modified independence and greater ease.   Baseline: Difficulty with opening jars and containers with use of left hand at eval; 01/15/23: pt reports easier time opening a water bottle top without dicem, but uses dycem to open jars (increased L hand weakness as compared to last  progress update, but pt reports increased weakness this week from having excess fluid pulled off him during dialysis and would like to re-measure on a Monday after pt has had a couple days off of dialysis). Goal status: IN PROGRESS  5.  Pt will improve left UE strength by 1 mm grade to hold and hand wash dishes with modified independence.  Baseline: difficulty with being able to hand wash dishes at eval, difficulty stabilizing dish; 01/15/23: easier with dishes, harder with a heavy pan Goal status: IN PROGRESS/partially met  6.  Pt will demonstrate FOTO score of 59 or greater to show a clinically relevant change in LUE to impact greater independence with daily ADL and IADL tasks at home and in the community.   Baseline: Eval: 53; 12/11/22: 55; 01/15/23: 56 Goal status: IN PROGRESS  ASSESSMENT:  CLINICAL IMPRESSION: Pt continues to demonstrate difficulty with left tip to tip prehension patterns especially with resistive tasks.  He fatigues easily with reaching tasks and requires occasional rest breaks.  Will continue to work towards goals to improve strength, coordination and hand function for daily tasks.   PERFORMANCE DEFICITS: in functional skills including ADLs, IADLs, coordination, dexterity, sensation, tone, ROM, strength, flexibility, Fine motor control, balance, endurance, decreased knowledge of use of DME, and UE functional use, cognitive skills including, and psychosocial skills including environmental adaptation, habits, and routines and behaviors.   IMPAIRMENTS: are limiting patient from ADLs, IADLs, and social participation.   CO-MORBIDITIES: has co-morbidities such as CAD, ESRD with dialysis, deafness in right ear, diabetic retinopathy, HTN, neuropathy  that affects occupational performance. Patient will  benefit from skilled OT to address above impairments and improve overall function.  MODIFICATION OR ASSISTANCE TO COMPLETE EVALUATION: Min-Moderate modification of tasks or assist  with assess necessary to complete an evaluation.  OT OCCUPATIONAL PROFILE AND HISTORY: Comprehensive assessment: Review of records and extensive additional review of physical, cognitive, psychosocial history related to current functional performance.  CLINICAL DECISION MAKING: Moderate - several treatment options, min-mod task modification necessary  REHAB POTENTIAL: Good  EVALUATION COMPLEXITY: Moderate PLAN:  OT FREQUENCY: 2x/week  OT DURATION: 12 weeks  PLANNED INTERVENTIONS: self care/ADL training, therapeutic exercise, therapeutic activity, neuromuscular re-education, manual therapy, balance training, paraffin, moist heat, contrast bath, patient/family education, and DME and/or AE instructions  RECOMMENDED OTHER SERVICES: Pt is being seen by PT currently.   CONSULTED AND AGREED WITH PLAN OF CARE: Patient  PLAN FOR NEXT SESSION: grip, pinch and coordination skills.   Jilene Spohr Cornelius Moras, OTR/L, CLT  Cathey Fredenburg, OT 02/08/2023, 3:42 PM

## 2023-02-10 ENCOUNTER — Ambulatory Visit: Payer: Medicare Other | Admitting: Occupational Therapy

## 2023-02-10 ENCOUNTER — Ambulatory Visit: Payer: Medicare Other

## 2023-02-10 DIAGNOSIS — M6281 Muscle weakness (generalized): Secondary | ICD-10-CM | POA: Diagnosis not present

## 2023-02-10 DIAGNOSIS — R2681 Unsteadiness on feet: Secondary | ICD-10-CM

## 2023-02-10 DIAGNOSIS — R278 Other lack of coordination: Secondary | ICD-10-CM

## 2023-02-10 DIAGNOSIS — R262 Difficulty in walking, not elsewhere classified: Secondary | ICD-10-CM

## 2023-02-11 NOTE — Therapy (Signed)
OUTPATIENT PHYSICAL THERAPY NEURO TREATMENT      Patient Name: Gregory Crane MRN: [id number] DOB:06-Jun-1971, 52 y.o., male Today's Date: 02/12/2023  PCP: Dale [place] MD REFERRING PROVIDER: Dale Elcho MD  PT End of Session - 02/12/23 1020     Visit Number 58    Number of Visits 80    Date for PT Re-Evaluation 04/28/23    Authorization Type Medicare    Authorization Time Period 11/11/22-02/03/23    Authorization - Number of Visits 12    Progress Note Due on Visit 60    PT Start Time 1020    PT Stop Time 1059    PT Time Calculation (min) 39 min    Equipment Utilized During Treatment Gait belt    Activity Tolerance Patient tolerated treatment well;No increased pain    Behavior During Therapy WFL for tasks assessed/performed               Past Medical History:  Diagnosis Date   Allergy    Anemia    Bell's palsy    Diabetes mellitus without complication (HCC)    diet controlled   Hypertension    Hypothyroidism    Kidney stones    Pseudotumor cerebri    Stroke [facility])    Past Surgical History:  Procedure Laterality Date   COLONOSCOPY WITH PROPOFOL N/A 03/30/2020   Procedure: COLONOSCOPY WITH PROPOFOL;  Surgeon: Regis Bill, MD;  Location: [facility] ENDOSCOPY;  Service: Endoscopy;  Laterality: N/A;   LOOP RECORDER INSERTION N/A 01/27/2018   Procedure: LOOP RECORDER INSERTION;  Surgeon: Duke Salvia, MD;  Location: [facility] INVASIVE CV LAB;  Service: Cardiovascular;  Laterality: N/A;   LUMBAR PUNCTURE     as child   NO PAST SURGERIES     TEE WITHOUT CARDIOVERSION N/A 01/07/2018   Procedure: TRANSESOPHAGEAL ECHOCARDIOGRAM (TEE);  Surgeon: Antonieta Iba, MD;  Location: [facility] ORS;  Service: Cardiovascular;  Laterality: N/A;   Patient Active Problem List   Diagnosis Date Noted   Unsteady gait 10/05/2022   Steal syndrome of dialysis vascular access (HCC) 05/28/2022   Hydronephrosis, left 05/04/2022   Type II diabetes mellitus with renal manifestations (HCC)  05/04/2022   Leukocytosis 05/04/2022   Pre-op evaluation 04/24/2022   Chest pain 01/13/2022   Diabetic retinopathy associated with diabetes mellitus due to underlying condition (HCC) 01/13/2022   ESRD on hemodialysis (HCC) 01/13/2022   Deafness in right ear 08/11/2021   Headache 07/07/2021   Hearing loss 07/07/2021   Open wound 01/25/2021   History of colon polyps 10/15/2020   Postoperative hemorrhage involving digestive system following digestive system procedure 09/19/2020   Acute cholecystitis without calculus 09/09/2020   Type 2 diabetes mellitus, with long-term current use of insulin (HCC) 09/09/2020   Cryptogenic stroke (HCC) 07/13/2020   History of loop recorder 07/13/2020   Weakness 06/18/2020   History of 2019 novel coronavirus disease (COVID-19) 05/20/2020   ESRD (end stage renal disease) (HCC) 04/04/2020   Pneumonia due to COVID-19 virus 04/03/2020   AKI (acute kidney injury) (HCC) 04/03/2020   Elevated troponin 04/03/2020   Acquired trigger finger 06/08/2019   Lymphedema 06/08/2019   Anemia 04/17/2019   Swelling of left lower extremity 01/10/2019   Facial droop 04/09/2018   Daytime somnolence 03/30/2018   Carotid artery disease (HCC) 02/03/2018   Intracranial vascular stenosis 09/12/2017   Cough 01/20/2017   Bell's palsy 11/10/2016   History of CVA (cerebrovascular accident) 11/10/2016   Benign localized hyperplasia of prostate with urinary obstruction 10/27/2016  History of nephrolithiasis 10/27/2016   TIA (transient ischemic attack) 10/20/2016   Near syncope 06/23/2016   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   Neuropathy 08/06/2015   Health care maintenance 08/06/2015   Essential hypertension 08/06/2015   Heme positive stool 10/10/2013   Hypothyroidism 10/10/2013   Microalbuminuria 10/10/2013   Hyperlipidemia 10/10/2013   B12 deficiency 10/10/2013   Diabetes (HCC) 07/04/2013   Environmental allergies 07/04/2013   ONSET DATE: 2-3 years  REFERRING DIAG:  Neuropathy  THERAPY DIAG:  Muscle weakness (generalized)  Unsteadiness on feet  Difficulty in walking, not elsewhere classified  Rationale for Evaluation and Treatment Rehabilitation  SUBJECTIVE:                                                                                                                                                                                             SUBJECTIVE STATEMENT: Pt was 5 min late to therapy. When asked how he is feeling, pt explained it is hard to tell how he is the day after therapy due to dialysis.   Pt accompanied by: self    PERTINENT HISTORY: Patient presents to physical therapy for neuropathy. He underwent banding of his L UE fistula on 05/13/22 and scheduled for additional banding on 06/07/22. 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 Patient reports his balance is very unsteady due to neuropathy in feet, feels weak in LE's.   PAIN:  Denies pain, but reports stiffness   PRECAUTIONS: Fall  WEIGHT BEARING RESTRICTIONS Yes no lifting >5 lb in arm   FALLS: Has patient fallen in last 6 months? No  PLOF: Independent  PATIENT GOALS to be more steady and walk as normally as possible.   OBJECTIVE:   TODAY'S TREATMENT:   Unless otherwise stated, CGA was provided and gait belt donned in order to ensure pt safety  TherEx: (8 min) -2 x 10 x B lunge position at bar with BUE support with front foot on dynadisc for PF/DF and calf stretch of rear leg -eversion / inversion with wash cloth 1 x 15 B (added to HEP) -1 x 15 B RTB resisted DF/PF  TherAct:  STS throughout session mod I Pt educated on updated HEP (see above) Pt educated on benefit of massaging feet   NMRE: (25 min) seated rest break provided btwn trials  -2 x 10 B: Lateral step / lunge in  cable harness, 7.5#, to target lateral stepping strategy  -2 x 15 fwd/bwd lunge position in harness, 7.5#, with soccer kicks for acceleration /  deceleration   -1 x 30 sec B: SLS on dynadisc w/ BUE support for ankle strategy and balance; pt required min-mod A from therapist to R foot in supination to go neutral. Pt reporeted "hard" -1 x 10 B: SLS on dynadisc with rear foot step back, pt reported "hard"   Trigger Point Dry Needling (TDN), unbilled Education performed with patient regarding potential benefit of TDN. Reviewed precautions and risks with patient. Reviewed special precautions/risks over lung fields which include pneumothorax. Reviewed signs and symptoms of pneumothorax and advised pt to go to ER immediately if these symptoms develop advise them of dry needling treatment. Extensive time spent with pt to ensure full understanding of TDN risks. Pt provided verbal consent to treatment. TDN performed to  with 0.25 x 40 single needle placements with local twitch response (LTR). Pistoning technique utilized. Improved pain-free motion following intervention. L calf x2 minutes    PATIENT EDUCATION: Education details: Pt educated throughout session about proper posture and technique with exercises. Improved exercise technique, movement at target joints, use of target muscles after min to mod verbal, visual, tactile cues.  Person educated: Patient Education method: Explanation, Demonstration, Tactile cues, and Verbal cues Education comprehension: verbalized understanding, returned demonstration, verbal cues required, and tactile cues required  HOME EXERCISE PROGRAM: Access Code: [email or web address] URL: [email or web address] Date: 02/12/2023 Prepared by: Precious Bard Exercises - Seated Heel Toe Raises  - 1 x daily - 7 x weekly - 2 sets - 10 reps - 5 hold - Standing Tandem Balance with Counter Support  - 1 x daily - 7 x weekly - 2 sets - 2 reps - 30 hold - Standing March with Counter Support  - 1  x daily - 7 x weekly - 2 sets - 10 reps - 5 hold - Ankle Inversion Eversion Towel Slide  - 1 x daily - 7 x weekly - 1 sets - 15 reps - 5 hold   Access Code: [email or web address] URL: [email or web address] Date: 06/03/2022 Prepared by: Precious Bard  Exercises - Seated Heel Toe Raises  - 1 x daily - 7 x weekly - 2 sets - 10 reps - 5 hold - Standing Tandem Balance with Counter Support  - 1 x daily - 7 x weekly - 2 sets - 2 reps - 30 hold - Standing March with Counter Support  - 1 x daily - 7 x weekly - 2 sets - 10 reps - 5 hold   GOALS: Goals reviewed with patient? Yes   SHORT TERM GOALS: Target date: 07/01/2022  Patient will be independent in home exercise program to improve strength/mobility for better functional independence with ADLs. Baseline:10/9; HEP given 2/21: HEP compliant  Goal status: MET   LONG TERM GOALS: Target date:04/28/2023    Patient will increase FOTO score to equal to or greater than   63%  to demonstrate statistically significant improvement in mobility and quality of life.  Baseline: 10/9: 53% 11/29: 58% 12/27: 51% 1/17: 56% 2/21: 53% 3/18: 60%  4/8 58%  Goal status: IN PROGRESS  2.  Patient (< 86 years old) will complete five times sit to stand test in < 10 seconds without UE support indicating an increased LE strength and improved balance. Baseline: 10/9: 19.6 seconds with walking stick; one LOB 11/29: 20.56 seconds 12/27: 13.4 seconds hands on knees 1/17: 13 seconds hands on knees  2/21: 14 seconds no hands 3/18: 12 seconds no hands. 4/8 11.3 sec UE on knees. 5/13: 10.94 sec pushing from thighs.  Goal status: IN  PROGRESS  3.  Patient will increase Berg Balance score by > 45/56 to demonstrate decreased fall risk during functional activities. Baseline: 10/9: 33/56 11/29: 16/10 96/04: 54/09 8/11:  91/47 2/21: 46/56 3/18: 48/56  Goal status: MET  +4.  Patient will increase 10 meter walk test to >1.6m/s as to improve gait speed for better community ambulation  and to reduce fall risk. Baseline: 11/29: 1.19 m/s Goal status: MET  5.  Patient will increase ABC scale score >80% to demonstrate better functional mobility and better confidence with ADLs.  Baseline: 10/9: 67% 11/29: 75.6% 12/27: 78%  2/21: 78% 3/18: 80% Goal status: MET  6. Patient will tolerate 5 seconds of single leg stance without loss of balance to improve ability to get in and out of shower safely. Baseline: 11/29: unable to stand on single leg safely 12/27: 2 seconds 1/17: 3 seconds 2/21: 3 seconds 3/18: 4 seconds. 4/8: 3 sec  5/13: LLE x 3 sec and RLE x 4 sec  Goal status: IN PROGRESS   6. Patient will increase six minute walk test distance to >1000 for progression to community ambulator and improve gait ability Baseline: 12/27: 845 ft with with walking stick 1/17: 731ft 2/21: 930 ft  3/18: 950 ft 4/8: 1170ft. 5/13: 1185 Goal status: met  7. Patient will increase dynamic gait index score to >19/24 as to demonstrate reduced fall risk and improved dynamic gait balance for better safety with community/home ambulation.  Baseline: 3/18:  9/24. 4/8: 16/24 5/13: 18/24 Goal status: in Progress  8. Patient will increase six minute walk test distance to >1500 for progression to community ambulator and improve gait ability Baseline: . 5/13: 1185 Goal status: NEW/PROGRESSED    ASSESSMENT:   CLINICAL IMPRESSION: Pt presented to therapy five minutes late, but was eager to participate and worked hard throughout session. Therex included a lot of targeted balance training and ankle strategy, and pt did consistently better at not hyperextending his knees throughout. Pt was dry needled today for the first time to his L calf due to lots of trigger points, will follow up with him next session to see how it felt afterwards. The pt will continue to benefit from skilled PT to make progress in BLE strength, balance, and coordination to return to PLOF and improve QoL.    OBJECTIVE IMPAIRMENTS  Abnormal gait, decreased activity tolerance, decreased balance, decreased coordination, decreased endurance, decreased mobility, difficulty walking, decreased strength, impaired flexibility, impaired sensation, improper body mechanics, and pain.   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   REHAB POTENTIAL: Good  CLINICAL DECISION MAKING: Evolving/moderate complexity  EVALUATION COMPLEXITY: Moderate  PLAN: PT FREQUENCY: 2x/week  PT DURATION: 12 weeks  PLANNED INTERVENTIONS: Therapeutic exercises, Therapeutic activity, Neuromuscular re-education, Balance training, Gait training, Patient/Family education, Self Care, Joint mobilization, Stair training, Vestibular training, Canalith repositioning, Visual/preceptual remediation/compensation, DME instructions, Dry Needling, Cognitive remediation, Spinal mobilization, Cryotherapy, Moist heat, Manual lymph drainage, Compression bandaging, Taping, Vasopneumatic device, Ultrasound, Manual therapy, and Re-evaluation  PLAN FOR NEXT  SESSION:   Progression with lateral stepping strategy; squats / lateral lunges with TRX straps  Judith Blonder, SPT   This entire session was performed under direct supervision and direction of a licensed  Estate agent . I have personally read, edited and approve of the note as written.  Precious Bard PT ,DPT Physical Therapist- [place]  [facility]

## 2023-02-12 ENCOUNTER — Encounter: Payer: Self-pay | Admitting: Internal Medicine

## 2023-02-12 ENCOUNTER — Ambulatory Visit: Payer: Medicare Other

## 2023-02-12 ENCOUNTER — Encounter: Payer: Self-pay | Admitting: Occupational Therapy

## 2023-02-12 ENCOUNTER — Ambulatory Visit: Payer: Medicare Other | Admitting: Occupational Therapy

## 2023-02-12 DIAGNOSIS — R278 Other lack of coordination: Secondary | ICD-10-CM

## 2023-02-12 DIAGNOSIS — M6281 Muscle weakness (generalized): Secondary | ICD-10-CM

## 2023-02-12 DIAGNOSIS — R262 Difficulty in walking, not elsewhere classified: Secondary | ICD-10-CM

## 2023-02-12 DIAGNOSIS — R2681 Unsteadiness on feet: Secondary | ICD-10-CM

## 2023-02-12 NOTE — Assessment & Plan Note (Signed)
Follow B12.  

## 2023-02-12 NOTE — Therapy (Signed)
OCCUPATIONAL THERAPY NEURO TREATMENT NOTE  Patient Name: Gregory Crane MRN: [id number] DOB:08/10/1971, 52 y.o., male  PCP: Dale [place] REFERRING PROVIDER: Dale Helotes  END OF SESSION:  OT End of Session - 02/12/23 1000     Visit Number 24    Number of Visits 44    Date for OT Re-Evaluation 04/09/23    OT Start Time 1015    OT Stop Time 1101    OT Time Calculation (min) 46 min    Activity Tolerance Patient tolerated treatment well    Behavior During Therapy WFL for tasks assessed/performed                  Past Medical History:  Diagnosis Date   Allergy    Anemia    Bell's palsy    Diabetes mellitus without complication (HCC)    diet controlled   Hypertension    Hypothyroidism    Kidney stones    Pseudotumor cerebri    Stroke [facility])    Past Surgical History:  Procedure Laterality Date   COLONOSCOPY WITH PROPOFOL N/A 03/30/2020   Procedure: COLONOSCOPY WITH PROPOFOL;  Surgeon: Regis Bill, MD;  Location: [facility] ENDOSCOPY;  Service: Endoscopy;  Laterality: N/A;   LOOP RECORDER INSERTION N/A 01/27/2018   Procedure: LOOP RECORDER INSERTION;  Surgeon: Duke Salvia, MD;  Location: [facility] INVASIVE CV LAB;  Service: Cardiovascular;  Laterality: N/A;   LUMBAR PUNCTURE     as child   NO PAST SURGERIES     TEE WITHOUT CARDIOVERSION N/A 01/07/2018   Procedure: TRANSESOPHAGEAL ECHOCARDIOGRAM (TEE);  Surgeon: Antonieta Iba, MD;  Location: [facility] ORS;  Service: Cardiovascular;  Laterality: N/A;   Patient Active Problem List   Diagnosis Date Noted   Unsteady gait 10/05/2022   Steal syndrome of dialysis vascular access (HCC) 05/28/2022   Hydronephrosis, left 05/04/2022   Type II diabetes mellitus with renal manifestations (HCC) 05/04/2022   Leukocytosis 05/04/2022   Pre-op evaluation 04/24/2022   Chest pain 01/13/2022   Diabetic retinopathy associated with diabetes mellitus due to underlying condition (HCC) 01/13/2022   ESRD on hemodialysis (HCC) 01/13/2022    Deafness in right ear 08/11/2021   Headache 07/07/2021   Hearing loss 07/07/2021   Open wound 01/25/2021   History of colon polyps 10/15/2020   Postoperative hemorrhage involving digestive system following digestive system procedure 09/19/2020   Acute cholecystitis without calculus 09/09/2020   Type 2 diabetes mellitus, with long-term current use of insulin (HCC) 09/09/2020   Cryptogenic stroke (HCC) 07/13/2020   History of loop recorder 07/13/2020   Weakness 06/18/2020   History of 2019 novel coronavirus disease (COVID-19) 05/20/2020   ESRD (end stage renal disease) (HCC) 04/04/2020   Pneumonia due to COVID-19 virus 04/03/2020   AKI (acute kidney injury) (HCC) 04/03/2020   Elevated troponin 04/03/2020   Acquired trigger finger 06/08/2019   Lymphedema 06/08/2019   Anemia 04/17/2019   Swelling of left lower extremity 01/10/2019   Facial droop 04/09/2018   Daytime somnolence 03/30/2018   Carotid artery disease (HCC) 02/03/2018   Intracranial vascular stenosis 09/12/2017   Cough 01/20/2017   Bell's palsy 11/10/2016   History of CVA (cerebrovascular accident) 11/10/2016   Benign localized hyperplasia of prostate with urinary obstruction 10/27/2016   History of nephrolithiasis 10/27/2016   TIA (transient ischemic attack) 10/20/2016   Near syncope 06/23/2016   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   Neuropathy 08/06/2015   Health care maintenance 08/06/2015   Essential hypertension 08/06/2015   Heme positive stool 10/10/2013  Hypothyroidism 10/10/2013   Microalbuminuria 10/10/2013   Hyperlipidemia 10/10/2013   B12 deficiency 10/10/2013   Diabetes (HCC) 07/04/2013   Environmental allergies 07/04/2013   ONSET DATE: May 2023  REFERRING DIAG: CVA  THERAPY DIAG:  Muscle weakness (generalized)  Other lack of coordination  Unsteadiness on feet  Rationale for Evaluation and Treatment: Rehabilitation  SUBJECTIVE:  SUBJECTIVE STATEMENT:  Pt denies any pain at the beginning of the  session.  Some mild discomfort noted with shoulder flexion on the left, 2/10.  Reports some stiffness in his left UE overall.  Pt has a friend here visiting from [place] who was present during treatment session.   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    PRECAUTIONS: Fall and Other: No strengthening exercises greater than 5-10 pounds in left arm.   Dialysis Tues, Th, Sat.  WEIGHT BEARING RESTRICTIONS: No  PAIN:  Are you having pain? No  FALLS: Has patient fallen in last 6 months? No  LIVING ENVIRONMENT: Lives with: alone Lives in: House/apartment Stairs: Yes: Internal: 15 steps; on right going up Has following equipment at home: Single point cane and Walker - 2 wheeled  PLOF: Independent  PATIENT GOALS: Pt reports he wants to have better use of his left hand, be able to do buttons and shoes.   OBJECTIVE:   HAND DOMINANCE: Right  ADLs: Overall ADLs: modified independent with most tasks Transfers/ambulation related to ADLs: Now walking without an assistive device, used cane in the past.  Eating: Independent with self feeding, difficulty with cutting food, requires increased focus and effort. Grooming: Independent UB Dressing: Difficulty with buttons, uses pullovers most of the time  LB Dressing: Difficulty with tying shoes Toileting: Independent Bathing: Independent Tub Shower transfers: Independent Equipment: none  IADLs: Shopping: independent,  uses a cart to push in the store Light housekeeping: modified independent Meal Prep: difficulty with tasks requiring use of left  hand, bilateral hand use.   Community mobility: Ambulates without use of assistive device, able to drive Medication management: Independent Financial management: Independent Handwriting: 90% legible  MOBILITY STATUS: Needs Assist: Pt has progressed with PT, used to use a cane but ambulating without an assistive device but still has balance difficulties.    POSTURE COMMENTS:    Sitting balance: Sits without UE support up to 30 sec  ACTIVITY TOLERANCE: Activity tolerance: limited activity tolerance  FUNCTIONAL OUTCOME MEASURES: FOTO: 53 12/11/22: FOTO: 55 01/15/23: 56  UPPER EXTREMITY ROM:    Active ROM Right eval Left eval  Shoulder flexion [facility]  Shoulder abduction [facility]  Shoulder adduction [facility]  Shoulder extension [facility]  Shoulder internal rotation [facility]  Shoulder external rotation [facility]  Elbow flexion [facility] WFL  Elbow extension [facility]  Wrist flexion [facility] WFL  Wrist extension [facility] WFL  Wrist ulnar deviation [facility] WFL  Wrist radial deviation [facility] WFL  Wrist pronation [facility] WFL  Wrist supination WFL WFL  (Blank rows = not tested)  UPPER EXTREMITY MMT:     MMT Right eval Left eval Left 01/15/23  Shoulder flexion 5/5 3+/5 4+  Shoulder abduction 5/5 3+/5 4+  Shoulder adduction  Shoulder extension     Shoulder internal rotation     Shoulder external rotation     Middle trapezius     Lower trapezius     Elbow flexion 5/5 3+/5 5  Elbow extension 5/5 3+/5 4+  Wrist flexion 5/5 3+/5 4+  Wrist extension 5/5 3+/5 4+  Wrist ulnar deviation 5/5 3+/5 4+  Wrist radial deviation 5/5 3+/5 4+  Wrist pronation 5/5 3+/5 4+  Wrist supination 5/5 3+/5 4+  (Blank rows = not tested)  Opposition of thumb to all digits on right, on left hand opposition to ring finger but cannot demonstrate to small finger.   HAND FUNCTION: Grip strength: Right: 44 lbs; Left: 35 lbs, Lateral pinch: Right: 17 lbs, Left: 16 lbs, 3 point pinch: Right: 14 lbs, Left: 0 lbs, and Tip pinch:  Right 2 lbs, Left: 0 lbs 12/23/2022: Grip strength: Right: 50 lbs; Left: 43 lbs, Lateral pinch: Right: 10 lbs, Left: 8 lbs, 3 point pinch: Right:  9 lbs, Left: 8 lbs, and Tip pinch: Right 8 lbs, Left: 4 lbs 01/15/23: R grip 45 lbs, Left: 34 lbs; R lateral pinch 10 lbs, L 7 lbs; 3 point pinch R: 7 lbs, L 7 lbs; tip pinch R: 8 lbs, L 4 lbs (At dialysis yesterday, pt reports they pulled a lot of fluid and pt was so tired that he almost got sick)  COORDINATION: 9 Hole Peg test: Right: 36 sec; Left: 56 sec 12/23/2022: 9 Hole Peg test: Right: 32 sec; Left: 45 sec 01/15/23: R hand 26 sec, Left: 43 sec  TODAY'S TREATMENT: 02/10/2023                                                                                                                               Therapeutic Ex.:  Pt seen this date for focus on LUE strengthening tasks with use of 1.5# weighted dowel for shoulder flexion, chest press, forwards and backwards circles, diagonal patterns, ABD/ADD.  Completed 15 reps for each set with therapist demonstration and cues and task modified to decrease pain at left shoulder with flexion.    Neuromuscular Reeducation:   Fine motor coordination skills with use of small to medium washers to place onto magnetic hooks on easel to encourage active reaching patterns in combination with fine motor coordination tasks.  Pt picking up washers from a resistive magnetic bowl, placing onto hooks one at a time.  When removing, patient was instructed to remove one at a time, move the item through his hand, use the palm for storage as he retrieved additional items.  Pt dropping items frequently and demonstrates difficulty with using the palm to securely hold onto items while performing manipulation tasks with thumb, index and middle fingers of the left hand.  Cues for prehension patterns.  Pt retrieving small beads from resistive putty, 20 items with left hand and placing items in container.  Difficulty with items less than  inch  in size.  PATIENT EDUCATION: Education details: intrinsic strengthening for L hand; progress towards goals Person educated: Patient Education method: Explanation, demo, written handout Education comprehension: verbalized understanding, min vc, tactile cues  HOME EXERCISE PROGRAM: Theraputty, tendon glides, intrinsic strengthening with digit abd/add and lumbrical isometric strengthening.  GOALS: Goals reviewed with patient? Yes  SHORT TERM GOALS: Target date:  02/26/2023   Pt will demonstrate ability to hold and utilize utensils for cutting meat with modified independence. Baseline: difficulty with cutting, stabilizing item with fork for cutting; 01/15/23: pt reports he can cut meat, but it's awkward Goal status: IN PROGRESS  2.  Pt will demonstrate ability to obtain keys from left pocket without difficult and without dropping.   Baseline: pt struggles with reaching into left pocket to get keys; 5/22: still challenging but can do it without dropping Goal status: IN PROGRESS   LONG TERM GOALS: Target date: 04/09/2023   Pt will demonstrate HEP with modified independence.   Baseline: no current program for left UE. (Has green tband but needs educ on exercises); 01/15/23: using theraputty and has recently begun to focus on L hand intrinsic strengthening  Goal status: IN PROGRESS  2.  Pt will improve left UE coordination by 10 secs on 9 hole peg test to demonstrate shoe tying with modified independence.  Baseline: currently wearing slip on shoes and unable to tie shoes, left 9 hole peg test in 56 sec at eval; 01/15/23: pt reports he can tie shoes, sometimes requires multiple attempts, but can do it.  Often chooses to wear slip ons as this is still challenging; L 9 hole 43 sec Goal status: IN PROGRESS/ partially met  3.  Pt will demonstrate improved coordination in left hand to demonstrate buttoning buttons on clothing with modified independence.  Baseline: difficulty with buttons  and has been wearing more pullovers to avoid challenge of buttons; 01/15/23: pt reports still challenging Goal status: IN PROGRESS  4.  Pt will demonstrate improvement in left grip strength by 5# to open jars and containers with modified independence and greater ease.   Baseline: Difficulty with opening jars and containers with use of left hand at eval; 01/15/23: pt reports easier time opening a water bottle top without dicem, but uses dycem to open jars (increased L hand weakness as compared to last progress update, but pt reports increased weakness this week from having excess fluid pulled off him during dialysis and would like to re-measure on a Monday after pt has had a couple days off of dialysis). Goal status: IN PROGRESS  5.  Pt will improve left UE strength by 1 mm grade to hold and hand wash dishes with modified independence.  Baseline: difficulty with being able to hand wash dishes at eval, difficulty stabilizing dish; 01/15/23: easier with dishes, harder with a heavy pan Goal status: IN PROGRESS/partially met  6.  Pt will demonstrate FOTO score of 59 or greater to show a clinically relevant change in LUE to impact greater independence with daily ADL and IADL tasks at home and in the community.   Baseline: Eval: 53; 12/11/22: 55; 01/15/23: 56 Goal status: IN PROGRESS  ASSESSMENT:  CLINICAL IMPRESSION: Pt reports some mild discomfort in his left shoulder with true shoulder flexion, did not have pain with scaption.  Task modified with pain response.  Pt able to complete tasks with short rest breaks as needed.  Difficulty noted with fine motor coordination/manipulation tasks especially higher level task demands with use of translatory skills of the hand and using the  hand for storage.  Pt required cues at times for proper form and technique as well as prehension patterns.  Continue to work towards goals in plan of care to improve fine motor coordination, strength and manipulation skills to  perform ADL and IADL tasks with greater independence.    PERFORMANCE DEFICITS: in functional skills including ADLs, IADLs, coordination, dexterity, sensation, tone, ROM, strength, flexibility, Fine motor control, balance, endurance, decreased knowledge of use of DME, and UE functional use, cognitive skills including, and psychosocial skills including environmental adaptation, habits, and routines and behaviors.   IMPAIRMENTS: are limiting patient from ADLs, IADLs, and social participation.   CO-MORBIDITIES: has co-morbidities such as CAD, ESRD with dialysis, deafness in right ear, diabetic retinopathy, HTN, neuropathy  that affects occupational performance. Patient will benefit from skilled OT to address above impairments and improve overall function.  MODIFICATION OR ASSISTANCE TO COMPLETE EVALUATION: Min-Moderate modification of tasks or assist with assess necessary to complete an evaluation.  OT OCCUPATIONAL PROFILE AND HISTORY: Comprehensive assessment: Review of records and extensive additional review of physical, cognitive, psychosocial history related to current functional performance.  CLINICAL DECISION MAKING: Moderate - several treatment options, min-mod task modification necessary  REHAB POTENTIAL: Good  EVALUATION COMPLEXITY: Moderate PLAN:  OT FREQUENCY: 2x/week  OT DURATION: 12 weeks  PLANNED INTERVENTIONS: self care/ADL training, therapeutic exercise, therapeutic activity, neuromuscular re-education, manual therapy, balance training, paraffin, moist heat, contrast bath, patient/family education, and DME and/or AE instructions  RECOMMENDED OTHER SERVICES: Pt is being seen by PT currently.   CONSULTED AND AGREED WITH PLAN OF CARE: Patient  PLAN FOR NEXT SESSION: grip, pinch and coordination skills.   Vermell Madrid T Bari Handshoe, OTR/L, CLT  Marijose Curington, OT 02/12/2023, 10:04 AM

## 2023-02-12 NOTE — Therapy (Addendum)
OCCUPATIONAL THERAPY NEURO TREATMENT NOTE  Patient Name: Gregory Crane MRN: [id number] DOB:December 17, 1970, 52 y.o., male  PCP: Dale [place] REFERRING PROVIDER: Dale [place]  END OF SESSION:  OT End of Session - 02/12/23 1225     Visit Number 25    Number of Visits 44    OT Start Time 1100    OT Stop Time 1145    OT Time Calculation (min) 45 min    Activity Tolerance Patient tolerated treatment well    Behavior During Therapy WFL for tasks assessed/performed                  Past Medical History:  Diagnosis Date   Allergy    Anemia    Bell's palsy    Diabetes mellitus without complication (HCC)    diet controlled   Hypertension    Hypothyroidism    Kidney stones    Pseudotumor cerebri    Stroke [facility])    Past Surgical History:  Procedure Laterality Date   COLONOSCOPY WITH PROPOFOL N/A 03/30/2020   Procedure: COLONOSCOPY WITH PROPOFOL;  Surgeon: Regis Bill, MD;  Location: [facility] ENDOSCOPY;  Service: Endoscopy;  Laterality: N/A;   LOOP RECORDER INSERTION N/A 01/27/2018   Procedure: LOOP RECORDER INSERTION;  Surgeon: Duke Salvia, MD;  Location: [facility] INVASIVE CV LAB;  Service: Cardiovascular;  Laterality: N/A;   LUMBAR PUNCTURE     as child   NO PAST SURGERIES     TEE WITHOUT CARDIOVERSION N/A 01/07/2018   Procedure: TRANSESOPHAGEAL ECHOCARDIOGRAM (TEE);  Surgeon: Antonieta Iba, MD;  Location: [facility] ORS;  Service: Cardiovascular;  Laterality: N/A;   Patient Active Problem List   Diagnosis Date Noted   Unsteady gait 10/05/2022   Steal syndrome of dialysis vascular access (HCC) 05/28/2022   Hydronephrosis, left 05/04/2022   Type II diabetes mellitus with renal manifestations (HCC) 05/04/2022   Leukocytosis 05/04/2022   Pre-op evaluation 04/24/2022   Chest pain 01/13/2022   Diabetic retinopathy associated with diabetes mellitus due to underlying condition (HCC) 01/13/2022   ESRD on hemodialysis (HCC) 01/13/2022   Deafness in right ear 08/11/2021    Headache 07/07/2021   Hearing loss 07/07/2021   Open wound 01/25/2021   History of colon polyps 10/15/2020   Postoperative hemorrhage involving digestive system following digestive system procedure 09/19/2020   Acute cholecystitis without calculus 09/09/2020   Type 2 diabetes mellitus, with long-term current use of insulin (HCC) 09/09/2020   Cryptogenic stroke (HCC) 07/13/2020   History of loop recorder 07/13/2020   Weakness 06/18/2020   History of 2019 novel coronavirus disease (COVID-19) 05/20/2020   ESRD (end stage renal disease) (HCC) 04/04/2020   Pneumonia due to COVID-19 virus 04/03/2020   AKI (acute kidney injury) (HCC) 04/03/2020   Elevated troponin 04/03/2020   Acquired trigger finger 06/08/2019   Lymphedema 06/08/2019   Anemia 04/17/2019   Swelling of left lower extremity 01/10/2019   Facial droop 04/09/2018   Daytime somnolence 03/30/2018   Carotid artery disease (HCC) 02/03/2018   Intracranial vascular stenosis 09/12/2017   Cough 01/20/2017   Bell's palsy 11/10/2016   History of CVA (cerebrovascular accident) 11/10/2016   Benign localized hyperplasia of prostate with urinary obstruction 10/27/2016   History of nephrolithiasis 10/27/2016   TIA (transient ischemic attack) 10/20/2016   Near syncope 06/23/2016   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   Neuropathy 08/06/2015   Health care maintenance 08/06/2015   Essential hypertension 08/06/2015   Heme positive stool 10/10/2013   Hypothyroidism 10/10/2013   Microalbuminuria 10/10/2013  Hyperlipidemia 10/10/2013   B12 deficiency 10/10/2013   Diabetes (HCC) 07/04/2013   Environmental allergies 07/04/2013   ONSET DATE: May 2023  REFERRING DIAG: CVA  THERAPY DIAG:  Muscle weakness (generalized)  Other lack of coordination  Rationale for Evaluation and Treatment: Rehabilitation  SUBJECTIVE:  SUBJECTIVE STATEMENT:  Pt reports he is doing well today. Pt. reports that he received dry needling at his PT appointment prior  to his OT session.   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    PRECAUTIONS: Fall and Other: No strengthening exercises greater than 5-10 pounds in left arm.   Dialysis Tues, Th, Sat.  WEIGHT BEARING RESTRICTIONS: No  PAIN:  Are you having pain? No  FALLS: Has patient fallen in last 6 months? No  LIVING ENVIRONMENT: Lives with: alone Lives in: House/apartment Stairs: Yes: Internal: 15 steps; on right going up Has following equipment at home: Single point cane and Walker - 2 wheeled  PLOF: Independent  PATIENT GOALS: Pt reports he wants to have better use of his left hand, be able to do buttons and shoes.   OBJECTIVE:   HAND DOMINANCE: Right  ADLs: Overall ADLs: modified independent with most tasks Transfers/ambulation related to ADLs: Now walking without an assistive device, used cane in the past.  Eating: Independent with self feeding, difficulty with cutting food, requires increased focus and effort. Grooming: Independent UB Dressing: Difficulty with buttons, uses pullovers most of the time  LB Dressing: Difficulty with tying shoes Toileting: Independent Bathing: Independent Tub Shower transfers: Independent Equipment: none  IADLs: Shopping: independent,  uses a cart to push in the store Light housekeeping: modified independent Meal Prep: difficulty with tasks requiring use of left hand, bilateral hand use.   Community mobility: Ambulates without use of assistive device, able to drive Medication management: Independent Financial management: Independent Handwriting: 90%  legible  MOBILITY STATUS: Needs Assist: Pt has progressed with PT, used to use a cane but ambulating without an assistive device but still has balance difficulties.    POSTURE COMMENTS:    Sitting balance: Sits without UE support up to 30 sec  ACTIVITY TOLERANCE: Activity tolerance: limited activity tolerance  FUNCTIONAL OUTCOME MEASURES: FOTO: 53 12/11/22: FOTO: 55 01/15/23: 56  UPPER EXTREMITY ROM:    Active ROM Right eval Left eval  Shoulder flexion [facility]  Shoulder abduction [facility]  Shoulder adduction [facility]  Shoulder extension [facility]  Shoulder internal rotation [facility]  Shoulder external rotation [facility]  Elbow flexion [facility] WFL  Elbow extension [facility]  Wrist flexion [facility] WFL  Wrist extension [facility] WFL  Wrist ulnar deviation [facility] WFL  Wrist radial deviation [facility] WFL  Wrist pronation [facility] WFL  Wrist supination WFL WFL  (Blank rows = not tested)  UPPER EXTREMITY MMT:     MMT Right eval Left eval Left 01/15/23  Shoulder flexion 5/5 3+/5 4+  Shoulder abduction 5/5 3+/5 4+  Shoulder adduction     Shoulder extension     Shoulder internal rotation     Shoulder external rotation     Middle trapezius     Lower trapezius  Elbow flexion 5/5 3+/5 5  Elbow extension 5/5 3+/5 4+  Wrist flexion 5/5 3+/5 4+  Wrist extension 5/5 3+/5 4+  Wrist ulnar deviation 5/5 3+/5 4+  Wrist radial deviation 5/5 3+/5 4+  Wrist pronation 5/5 3+/5 4+  Wrist supination 5/5 3+/5 4+  (Blank rows = not tested)  Opposition of thumb to all digits on right, on left hand opposition to ring finger but cannot demonstrate to small finger.   HAND FUNCTION: Grip strength: Right: 44 lbs; Left: 35 lbs, Lateral pinch: Right: 17 lbs, Left: 16 lbs, 3 point pinch: Right: 14 lbs, Left: 0 lbs, and Tip pinch: Right 2 lbs, Left: 0 lbs 12/23/2022: Grip strength: Right: 50 lbs; Left: 43 lbs, Lateral pinch: Right: 10 lbs, Left: 8 lbs, 3 point pinch: Right:  9 lbs, Left: 8 lbs, and Tip pinch: Right 8 lbs,  Left: 4 lbs 01/15/23: R grip 45 lbs, Left: 34 lbs; R lateral pinch 10 lbs, L 7 lbs; 3 point pinch R: 7 lbs, L 7 lbs; tip pinch R: 8 lbs, L 4 lbs (At dialysis yesterday, pt reports they pulled a lot of fluid and pt was so tired that he almost got sick)  COORDINATION: 9 Hole Peg test: Right: 36 sec; Left: 56 sec 12/23/2022: 9 Hole Peg test: Right: 32 sec; Left: 45 sec 01/15/23: R hand 26 sec, Left: 43 sec  TODAY'S TREATMENT: 02/12/2023                                                                                                                               Neuromuscular Reeducation:   Pt. worked on [facility] skills picking up grooved pegs using tip pinch followed by translatory movements to move grooved pegs from the palm of the hand to the tip of the 2nd digit, and thumb before placing them onto grooved peg board. Pt. Placed 4 grooved pegs onto grooved peg board. Pt. Worked on picking up  1/8" cylindrical pegs and storing as many as he could within his hand without dropping them. Pt. Worked on releasing 1/8" cylindrical pegs one at a time out of the bottom of his hand onto the top of the container with his forearm in neutral position. Pt. Worked on picking up 1/8" pegs off of the top of the container using three point pinch to pick up the pegs up with tweezers. Pt. Worked on placing them onto small peg board. Pt. Worked on [facility] skills by picking up 3/4" flat marbles using tip pinch and storing as many as he could within his hand before discarding them onto the top of the container using translatory movements to move 3/4" flat marbles from the palm to the top of the 2nd digit.   PATIENT EDUCATION: Education details: intrinsic strengthening for L hand; progress towards goals Person educated: Patient Education method: Explanation, demo, written handout Education comprehension: verbalized understanding, min vc, tactile cues  HOME EXERCISE PROGRAM: Theraputty, tendon glides, intrinsic strengthening  with digit  abd/add and lumbrical isometric strengthening.  GOALS: Goals reviewed with patient? Yes  SHORT TERM GOALS: Target date:  02/26/2023   Pt will demonstrate ability to hold and utilize utensils for cutting meat with modified independence. Baseline: difficulty with cutting, stabilizing item with fork for cutting; 01/15/23: pt reports he can cut meat, but it's awkward Goal status: IN PROGRESS  2.  Pt will demonstrate ability to obtain keys from left pocket without difficult and without dropping.   Baseline: pt struggles with reaching into left pocket to get keys; 5/22: still challenging but can do it without dropping Goal status: IN PROGRESS   LONG TERM GOALS: Target date: 04/09/2023   Pt will demonstrate HEP with modified independence.   Baseline: no current program for left UE. (Has green tband but needs educ on exercises); 01/15/23: using theraputty and has recently begun to focus on L hand intrinsic strengthening  Goal status: IN PROGRESS  2.  Pt will improve left UE coordination by 10 secs on 9 hole peg test to demonstrate shoe tying with modified independence.  Baseline: currently wearing slip on shoes and unable to tie shoes, left 9 hole peg test in 56 sec at eval; 01/15/23: pt reports he can tie shoes, sometimes requires multiple attempts, but can do it.  Often chooses to wear slip ons as this is still challenging; L 9 hole 43 sec Goal status: IN PROGRESS/ partially met  3.  Pt will demonstrate improved coordination in left hand to demonstrate buttoning buttons on clothing with modified independence.  Baseline: difficulty with buttons and has been wearing more pullovers to avoid challenge of buttons; 01/15/23: pt reports still challenging Goal status: IN PROGRESS  4.  Pt will demonstrate improvement in left grip strength by 5# to open jars and containers with modified independence and greater ease.   Baseline: Difficulty with opening jars and containers with use of left hand at eval;  01/15/23: pt reports easier time opening a water bottle top without dicem, but uses dycem to open jars (increased L hand weakness as compared to last progress update, but pt reports increased weakness this week from having excess fluid pulled off him during dialysis and would like to re-measure on a Monday after pt has had a couple days off of dialysis). Goal status: IN PROGRESS  5.  Pt will improve left UE strength by 1 mm grade to hold and hand wash dishes with modified independence.  Baseline: difficulty with being able to hand wash dishes at eval, difficulty stabilizing dish; 01/15/23: easier with dishes, harder with a heavy pan Goal status: IN PROGRESS/partially met  6.  Pt will demonstrate FOTO score of 59 or greater to show a clinically relevant change in LUE to impact greater independence with daily ADL and IADL tasks at home and in the community.   Baseline: Eval: 53; 12/11/22: 55; 01/15/23: 56 Goal status: IN PROGRESS  ASSESSMENT:  CLINICAL IMPRESSION:  Pt. was able to pick up grooved pegs using tip pinch however required increased time and verbal cues when using translatory movements to transfer pegs from the palm to the 2nd digit. Pt. was able to place 4 grooved pegs onto peg board before moving on to the next activity. Pt. was able to pick up 1/8" cylindrical pegs and store up to 8 within his hand however, Pt. experienced difficulties when releasing one at a time from the bottom of his hand onto the top of the container often dropping multiple at a time instead of  one at a time. Pt. was able to use tweezers to pick up 1/8" pegs and place them onto peg board however required increased time and dropped multiple throughout. Pt. was able to pick up 3/4" flat marbles and store 6 within his hand however experienced difficulties when performing translatory movements to move 1/8 inch flat marbles from palm to 2nd digit. Pt. presents with difficulty using the left hand for performing translatory  movements with small objects. Pt continues to benefit from skilled OT services to increase strength, coordination and hand function for necessary daily tasks.     PERFORMANCE DEFICITS: in functional skills including ADLs, IADLs, coordination, dexterity, sensation, tone, ROM, strength, flexibility, Fine motor control, balance, endurance, decreased knowledge of use of DME, and UE functional use, cognitive skills including, and psychosocial skills including environmental adaptation, habits, and routines and behaviors.   IMPAIRMENTS: are limiting patient from ADLs, IADLs, and social participation.   CO-MORBIDITIES: has co-morbidities such as CAD, ESRD with dialysis, deafness in right ear, diabetic retinopathy, HTN, neuropathy  that affects occupational performance. Patient will benefit from skilled OT to address above impairments and improve overall function.  MODIFICATION OR ASSISTANCE TO COMPLETE EVALUATION: Min-Moderate modification of tasks or assist with assess necessary to complete an evaluation.  OT OCCUPATIONAL PROFILE AND HISTORY: Comprehensive assessment: Review of records and extensive additional review of physical, cognitive, psychosocial history related to current functional performance.  CLINICAL DECISION MAKING: Moderate - several treatment options, min-mod task modification necessary  REHAB POTENTIAL: Good  EVALUATION COMPLEXITY: Moderate PLAN:  OT FREQUENCY: 2x/week  OT DURATION: 12 weeks  PLANNED INTERVENTIONS: self care/ADL training, therapeutic exercise, therapeutic activity, neuromuscular re-education, manual therapy, balance training, paraffin, moist heat, contrast bath, patient/family education, and DME and/or AE instructions  RECOMMENDED OTHER SERVICES: Pt is being seen by PT currently.   CONSULTED AND AGREED WITH PLAN OF CARE: Patient  PLAN FOR NEXT SESSION: grip, pinch and coordination skills.    Herma Carson, Student-OT 02/12/2023, 2:57 PM  This entire  session was performed under the direct supervision and direction of a licensed therapist. I have personally read, edited, and approve of the note as written.   Olegario Messier, MS, OTR/L  02/12/2023

## 2023-02-12 NOTE — Assessment & Plan Note (Signed)
Continue hemodialysis.  Followed by nephrology.  Follows labs through dialysis.

## 2023-02-12 NOTE — Assessment & Plan Note (Signed)
Low cholesterol diet and exercise.  Follow lipid panel and liver function tests.  

## 2023-02-12 NOTE — Assessment & Plan Note (Signed)
Low carb diet and exercise.  No change in medication.  Seeing endocrinology.  Last A1c last check - 6.8. Follow met b and a1c.

## 2023-02-12 NOTE — Assessment & Plan Note (Signed)
Continue aspirin and lipitor.  Continue blood pressure control.    

## 2023-02-12 NOTE — Assessment & Plan Note (Signed)
Continue lyrica.  Follow.  

## 2023-02-12 NOTE — Assessment & Plan Note (Signed)
Colonoscopy 03/2020 - multiple polyps.  Recommended f/u in 6 months.  Overdue.  Need to schedule f/u.

## 2023-02-12 NOTE — Assessment & Plan Note (Signed)
Blood pressure has been doing better.  Continue hydralazine and amlodipine.  Follow pressures.  Follow metabolic panel.  

## 2023-02-12 NOTE — Assessment & Plan Note (Signed)
Recommend - cystoscopy with retrograde pyelography and possible ureteroscopy and stent. Dr Apolinar Junes 10/31/22 - plan f/u renal ultrasound.

## 2023-02-12 NOTE — Assessment & Plan Note (Signed)
On thyroid replacement.  Follow tsh. Instructed on importance of taking regularly.   

## 2023-02-12 NOTE — Therapy (Signed)
OUTPATIENT PHYSICAL THERAPY NEURO TREATMENT      Patient Name: Gregory Crane MRN: [id number] DOB:1970/09/14, 52 y.o., male Today's Date: 02/17/2023  PCP: Dale Meridian MD REFERRING PROVIDER: Dale Derby MD  PT End of Session - 02/17/23 0920     Visit Number 59    Number of Visits 80    Date for PT Re-Evaluation 04/28/23    Authorization Type Medicare    Authorization Time Period 11/11/22-02/03/23    Authorization - Number of Visits 12    Progress Note Due on Visit 60    PT Start Time 0930    PT Stop Time 1012    PT Time Calculation (min) 42 min    Equipment Utilized During Treatment Gait belt    Activity Tolerance Patient tolerated treatment well;No increased pain    Behavior During Therapy WFL for tasks assessed/performed               Past Medical History:  Diagnosis Date   Allergy    Anemia    Bell's palsy    Diabetes mellitus without complication (HCC)    diet controlled   Hypertension    Hypothyroidism    Kidney stones    Pseudotumor cerebri    Stroke [facility])    Past Surgical History:  Procedure Laterality Date   COLONOSCOPY WITH PROPOFOL N/A 03/30/2020   Procedure: COLONOSCOPY WITH PROPOFOL;  Surgeon: Regis Bill, MD;  Location: [facility] ENDOSCOPY;  Service: Endoscopy;  Laterality: N/A;   LOOP RECORDER INSERTION N/A 01/27/2018   Procedure: LOOP RECORDER INSERTION;  Surgeon: Duke Salvia, MD;  Location: [facility] INVASIVE CV LAB;  Service: Cardiovascular;  Laterality: N/A;   LUMBAR PUNCTURE     as child   NO PAST SURGERIES     TEE WITHOUT CARDIOVERSION N/A 01/07/2018   Procedure: TRANSESOPHAGEAL ECHOCARDIOGRAM (TEE);  Surgeon: Antonieta Iba, MD;  Location: [facility] ORS;  Service: Cardiovascular;  Laterality: N/A;   Patient Active Problem List   Diagnosis Date Noted   Unsteady gait 10/05/2022   Steal syndrome of dialysis vascular access (HCC) 05/28/2022   Hydronephrosis, left 05/04/2022   Type II diabetes mellitus with renal manifestations (HCC)  05/04/2022   Leukocytosis 05/04/2022   Pre-op evaluation 04/24/2022   Chest pain 01/13/2022   Diabetic retinopathy associated with diabetes mellitus due to underlying condition (HCC) 01/13/2022   ESRD on hemodialysis (HCC) 01/13/2022   Deafness in right ear 08/11/2021   Headache 07/07/2021   Hearing loss 07/07/2021   Open wound 01/25/2021   History of colon polyps 10/15/2020   Postoperative hemorrhage involving digestive system following digestive system procedure 09/19/2020   Acute cholecystitis without calculus 09/09/2020   Type 2 diabetes mellitus, with long-term current use of insulin (HCC) 09/09/2020   Cryptogenic stroke (HCC) 07/13/2020   History of loop recorder 07/13/2020   Weakness 06/18/2020   History of 2019 novel coronavirus disease (COVID-19) 05/20/2020   ESRD (end stage renal disease) (HCC) 04/04/2020   Pneumonia due to COVID-19 virus 04/03/2020   AKI (acute kidney injury) (HCC) 04/03/2020   Elevated troponin 04/03/2020   Acquired trigger finger 06/08/2019   Lymphedema 06/08/2019   Anemia 04/17/2019   Swelling of left lower extremity 01/10/2019   Facial droop 04/09/2018   Daytime somnolence 03/30/2018   Carotid artery disease (HCC) 02/03/2018   Intracranial vascular stenosis 09/12/2017   Cough 01/20/2017   Bell's palsy 11/10/2016   History of CVA (cerebrovascular accident) 11/10/2016   Benign localized hyperplasia of prostate with urinary obstruction 10/27/2016  History of nephrolithiasis 10/27/2016   TIA (transient ischemic attack) 10/20/2016   Near syncope 06/23/2016   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   Neuropathy 08/06/2015   Health care maintenance 08/06/2015   Essential hypertension 08/06/2015   Heme positive stool 10/10/2013   Hypothyroidism 10/10/2013   Microalbuminuria 10/10/2013   Hyperlipidemia 10/10/2013   B12 deficiency 10/10/2013   Diabetes (HCC) 07/04/2013   Environmental allergies 07/04/2013   ONSET DATE: 2-3 years  REFERRING DIAG:  Neuropathy  THERAPY DIAG:  Muscle weakness (generalized)  Difficulty in walking, not elsewhere classified  Unsteadiness on feet  Abnormality of gait and mobility  Rationale for Evaluation and Treatment Rehabilitation  SUBJECTIVE:                                                                                                                                                                                             SUBJECTIVE STATEMENT: Pt reported he was not sore after dry needling, and felt it helped a little. Pt had a good weekend playing with his grandchildren.   Pt accompanied by: self    PERTINENT HISTORY: Patient presents to physical therapy for neuropathy. He underwent banding of his L UE fistula on 05/13/22 and scheduled for additional banding on 06/07/22. 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 Patient reports his balance is very unsteady due to neuropathy in feet, feels weak in LE's.   PAIN:  Denies pain, but reports stiffness   PRECAUTIONS: Fall  WEIGHT BEARING RESTRICTIONS Yes no lifting >5 lb in arm   FALLS: Has patient fallen in last 6 months? No  PLOF: Independent  PATIENT GOALS to be more steady and walk as normally as possible.   OBJECTIVE:   TODAY'S TREATMENT:   Unless otherwise stated, CGA was provided and gait belt donned in order to ensure pt safety  TherEx: (28 min) -5xSTS for time: updated goals 10.08 sec  -2 x 10 re: DF/PF w/ RTB; 5# AW were added. -2 x 10 re: ankle eversion; " -3 x 10: TRX squats w/ chair behind; cues to engage quads w/o locking knees.  -1 x 60 sec B: hip flex / gastroc stretch with BUE support at bar  -1 x 60 sec B: ankle / soleus stretch w/ B forearm support and slight trunk lean onto bar; pt reports at about the 40 sec mark, he feels his  gastroc release  TherAct:  STS throughout session mod I; although SPT noted increase in fatigue following exercises.    NMRE: (10  min) -standing calf raise at bar, palpable contraction but unable to raise SL against gravity after 5 attempts x B  -transitioned to seated calf raises with eccentric control x 2; alternating w/ quick calf pulses 2 x 15 for fast twitch mm activation  - 2 x 30: SLS on Airex, UUE support on bar, w/ minA for ankle control to avoid inversion on the LLE. Pt reported "difficult" during second trial due to not locking the knee and engaging the glute.     PATIENT EDUCATION: Education details: Pt educated throughout session about proper posture and technique with exercises. Improved exercise technique, movement at target joints, use of target muscles after min to mod verbal, visual, tactile cues.  Person educated: Patient Education method: Explanation, Demonstration, Tactile cues, and Verbal cues Education comprehension: verbalized understanding, returned demonstration, verbal cues required, and tactile cues required  HOME EXERCISE PROGRAM: Access Code: [email or web address] URL: [email or web address] Date: 02/12/2023 Prepared by: Precious Bard Exercises - Seated Heel Toe Raises  - 1 x daily - 7 x weekly - 2 sets - 10 reps - 5 hold - Standing Tandem Balance with Counter Support  - 1 x daily - 7 x weekly - 2 sets - 2 reps - 30 hold - Standing March with Counter Support  - 1 x daily - 7 x weekly - 2 sets - 10 reps - 5 hold - Ankle Inversion Eversion Towel Slide  - 1 x daily - 7 x weekly - 1 sets - 15 reps - 5 hold   Access Code: [email or web address] URL: [email or web address] Date: 06/03/2022 Prepared by: Precious Bard  Exercises - Seated Heel Toe Raises  - 1 x daily - 7 x weekly - 2 sets - 10 reps - 5 hold - Standing Tandem Balance with Counter Support  - 1 x daily - 7 x weekly - 2 sets - 2 reps - 30 hold - Standing March with Counter Support  - 1 x daily - 7 x  weekly - 2 sets - 10 reps - 5 hold   GOALS: Goals reviewed with patient? Yes   SHORT TERM GOALS: Target date: 07/01/2022  Patient will be independent in home exercise program to improve strength/mobility for better functional independence with ADLs. Baseline:10/9; HEP given 2/21: HEP compliant  Goal status: MET   LONG TERM GOALS: Target date:04/28/2023    Patient will increase FOTO score to equal to or greater than   63%  to demonstrate statistically significant improvement in mobility and quality of life.  Baseline: 10/9: 53% 11/29: 58% 12/27: 51% 1/17: 56% 2/21: 53% 3/18: 60%  4/8 58%  Goal status: IN PROGRESS  2.  Patient (< 47 years old) will complete five times sit to stand test in < 10 seconds without UE support indicating an increased LE strength and improved balance. Baseline: 10/9: 19.6 seconds with walking stick; one LOB 11/29: 20.56 seconds 12/27: 13.4 seconds hands on knees 1/17: 13 seconds hands on knees  2/21: 14 seconds no hands 3/18: 12 seconds no hands. 4/8 11.3 sec UE on knees. 5/13: 10.94 sec pushing from thighs. 6/24: 10.08 sec, pushing from thighs  Goal status: IN PROGRESS  3.  Patient will increase Berg Balance score by > 45/56 to demonstrate decreased fall risk during functional activities. Baseline: 10/9: 33/56 11/29: 40/98 11/91: 47/82 9/56:  21/30 2/21: 46/56 3/18: 48/56  Goal status: MET  +4.  Patient will increase 10 meter walk test to >1.95m/s as to improve gait speed for better community ambulation and to reduce  fall risk. Baseline: 11/29: 1.19 m/s Goal status: MET  5.  Patient will increase ABC scale score >80% to demonstrate better functional mobility and better confidence with ADLs.  Baseline: 10/9: 67% 11/29: 75.6% 12/27: 78%  2/21: 78% 3/18: 80% Goal status: MET  6. Patient will tolerate 5 seconds of single leg stance without loss of balance to improve ability to get in and out of shower safely. Baseline: 11/29: unable to stand on single leg  safely 12/27: 2 seconds 1/17: 3 seconds 2/21: 3 seconds 3/18: 4 seconds. 4/8: 3 sec  5/13: LLE x 3 sec and RLE x 4 sec  Goal status: IN PROGRESS   6. Patient will increase six minute walk test distance to >1000 for progression to community ambulator and improve gait ability Baseline: 12/27: 845 ft with with walking stick 1/17: 730ft 2/21: 930 ft  3/18: 950 ft 4/8: 1176ft. 5/13: 1185 Goal status: met  7. Patient will increase dynamic gait index score to >19/24 as to demonstrate reduced fall risk and improved dynamic gait balance for better safety with community/home ambulation.  Baseline: 3/18:  9/24. 4/8: 16/24 5/13: 18/24 Goal status: in Progress  8. Patient will increase six minute walk test distance to >1500 for progression to community ambulator and improve gait ability Baseline: . 5/13: 1185 Goal status: NEW/PROGRESSED    ASSESSMENT: CLINICAL IMPRESSION: Pt presented to skilled PT motivated to participate in therapy. Therex targeted a lot of BLE strengthening to include targeting of B calf mm in weight bearing. A palpable contraction of gastroc was felt during standing calf raises, but pt was unable to do so against gravity. When activity was transitioned to seated, pt was able to perform task but reported it was difficult. Balance tasks continue to be a challenge, and pt needs min A to prevent L ankle inversion, but pt was able to decrease UE support to unilateral > B. Pt continues to consistently improve at not hyperextending his knees throughout, reporting it makes all activities harder, with noted increase in fatigue. The pt will continue to benefit from skilled PT to make progress in BLE strength, balance, and coordination to return to PLOF and improve QoL.    OBJECTIVE IMPAIRMENTS Abnormal gait, decreased activity tolerance, decreased balance, decreased coordination, decreased endurance, decreased mobility, difficulty walking, decreased strength, impaired flexibility, impaired  sensation, improper body mechanics, and pain.   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   REHAB POTENTIAL: Good  CLINICAL DECISION MAKING: Evolving/moderate complexity  EVALUATION COMPLEXITY: Moderate  PLAN: PT FREQUENCY: 2x/week  PT DURATION: 12 weeks  PLANNED INTERVENTIONS: Therapeutic exercises, Therapeutic activity, Neuromuscular re-education, Balance training, Gait training, Patient/Family education, Self Care, Joint mobilization, Stair training, Vestibular training, Canalith repositioning, Visual/preceptual remediation/compensation, DME instructions, Dry Needling, Cognitive remediation, Spinal mobilization, Cryotherapy, Moist heat, Manual lymph drainage, Compression bandaging, Taping, Vasopneumatic device, Ultrasound, Manual therapy, and Re-evaluation  PLAN FOR NEXT SESSION:   Progression with lateral stepping strategy; squats / lateral lunges with TRX straps  Judith Blonder, SPT  This entire session was performed under direct supervision and direction of a licensed therapist/therapist assistant . I have personally read, edited and approve of the note as written.  Precious Bard PT ,DPT Physical Therapist- [place]  [facility]

## 2023-02-12 NOTE — Assessment & Plan Note (Signed)
Sees endocrinology. Low carb diet.  Dexcom.  Follow.  Most recent A1c 6.8%.

## 2023-02-12 NOTE — Assessment & Plan Note (Signed)
Sees endocrinology. Low carb diet.  Dexcom.  Follow.  Most recent A1c 6.8%. continue f/u with ophthalmology.

## 2023-02-12 NOTE — Assessment & Plan Note (Signed)
Working with PT for strengthening and balance.  PT - recommended orthotics.  Insurance did not cover.

## 2023-02-12 NOTE — Assessment & Plan Note (Signed)
Last carotid ultrasound - <50% bilaterally.  Needs to take statin and keep blood pressure under control.  

## 2023-02-12 NOTE — Assessment & Plan Note (Signed)
Labs have been followed through dialysis.  Obtain lab results.

## 2023-02-16 ENCOUNTER — Other Ambulatory Visit: Payer: Self-pay | Admitting: Internal Medicine

## 2023-02-17 ENCOUNTER — Ambulatory Visit: Payer: Medicare Other | Admitting: Occupational Therapy

## 2023-02-17 ENCOUNTER — Ambulatory Visit: Payer: Medicare Other

## 2023-02-17 DIAGNOSIS — R278 Other lack of coordination: Secondary | ICD-10-CM

## 2023-02-17 DIAGNOSIS — M6281 Muscle weakness (generalized): Secondary | ICD-10-CM

## 2023-02-17 DIAGNOSIS — R262 Difficulty in walking, not elsewhere classified: Secondary | ICD-10-CM

## 2023-02-17 DIAGNOSIS — R2681 Unsteadiness on feet: Secondary | ICD-10-CM

## 2023-02-17 DIAGNOSIS — R269 Unspecified abnormalities of gait and mobility: Secondary | ICD-10-CM

## 2023-02-18 NOTE — Therapy (Signed)
OUTPATIENT PHYSICAL THERAPY NEURO TREATMENT Physical Therapy Progress Note   Dates of reporting period  01/06/23   to   02/19/23      Patient Name: Gregory Crane MRN: [id number] DOB:Jan 21, 1971, 52 y.o., male Today's Date: 02/19/2023  PCP: Dale Houghton MD REFERRING PROVIDER: Dale Leakesville MD  PT End of Session - 02/19/23 1014     Visit Number 60    Number of Visits 80    Date for PT Re-Evaluation 04/28/23    Authorization Type Medicare    Authorization Time Period 11/11/22-02/03/23    Authorization - Number of Visits 12    Progress Note Due on Visit 60    PT Start Time 1015    PT Stop Time 1058    PT Time Calculation (min) 43 min    Equipment Utilized During Treatment Gait belt    Activity Tolerance Patient tolerated treatment well;No increased pain    Behavior During Therapy WFL for tasks assessed/performed             Past Medical History:  Diagnosis Date   Allergy    Anemia    Bell's palsy    Diabetes mellitus without complication (HCC)    diet controlled   Hypertension    Hypothyroidism    Kidney stones    Pseudotumor cerebri    Stroke [facility])    Past Surgical History:  Procedure Laterality Date   COLONOSCOPY WITH PROPOFOL N/A 03/30/2020   Procedure: COLONOSCOPY WITH PROPOFOL;  Surgeon: Regis Bill, MD;  Location: [facility] ENDOSCOPY;  Service: Endoscopy;  Laterality: N/A;   LOOP RECORDER INSERTION N/A 01/27/2018   Procedure: LOOP RECORDER INSERTION;  Surgeon: Duke Salvia, MD;  Location: [facility] INVASIVE CV LAB;  Service: Cardiovascular;  Laterality: N/A;   LUMBAR PUNCTURE     as child   NO PAST SURGERIES     TEE WITHOUT CARDIOVERSION N/A 01/07/2018   Procedure: TRANSESOPHAGEAL ECHOCARDIOGRAM (TEE);  Surgeon: Antonieta Iba, MD;  Location: [facility] ORS;  Service: Cardiovascular;  Laterality: N/A;   Patient Active Problem List   Diagnosis Date Noted   Unsteady gait 10/05/2022   Steal syndrome of dialysis vascular access (HCC) 05/28/2022    Hydronephrosis, left 05/04/2022   Type II diabetes mellitus with renal manifestations (HCC) 05/04/2022   Leukocytosis 05/04/2022   Pre-op evaluation 04/24/2022   Chest pain 01/13/2022   Diabetic retinopathy associated with diabetes mellitus due to underlying condition (HCC) 01/13/2022   ESRD on hemodialysis (HCC) 01/13/2022   Deafness in right ear 08/11/2021   Headache 07/07/2021   Hearing loss 07/07/2021   Open wound 01/25/2021   History of colon polyps 10/15/2020   Postoperative hemorrhage involving digestive system following digestive system procedure 09/19/2020   Acute cholecystitis without calculus 09/09/2020   Type 2 diabetes mellitus, with long-term current use of insulin (HCC) 09/09/2020   Cryptogenic stroke (HCC) 07/13/2020   History of loop recorder 07/13/2020   Weakness 06/18/2020   History of 2019 novel coronavirus disease (COVID-19) 05/20/2020   ESRD (end stage renal disease) (HCC) 04/04/2020   Pneumonia due to COVID-19 virus 04/03/2020   AKI (acute kidney injury) (HCC) 04/03/2020   Elevated troponin 04/03/2020   Acquired trigger finger 06/08/2019   Lymphedema 06/08/2019   Anemia 04/17/2019   Swelling of left lower extremity 01/10/2019   Facial droop 04/09/2018   Daytime somnolence 03/30/2018   Carotid artery disease (HCC) 02/03/2018   Intracranial vascular stenosis 09/12/2017   Cough 01/20/2017   Bell's palsy 11/10/2016   History  of CVA (cerebrovascular accident) 11/10/2016   Benign localized hyperplasia of prostate with urinary obstruction 10/27/2016   History of nephrolithiasis 10/27/2016   TIA (transient ischemic attack) 10/20/2016   Near syncope 06/23/2016   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   Neuropathy 08/06/2015   Health care maintenance 08/06/2015   Essential hypertension 08/06/2015   Heme positive stool 10/10/2013   Hypothyroidism 10/10/2013   Microalbuminuria 10/10/2013   Hyperlipidemia 10/10/2013   B12 deficiency 10/10/2013   Diabetes (HCC)  07/04/2013   Environmental allergies 07/04/2013   ONSET DATE: 2-3 years  REFERRING DIAG: Neuropathy  THERAPY DIAG:  Muscle weakness (generalized)  Difficulty in walking, not elsewhere classified  Unsteadiness on feet  Abnormality of gait and mobility  Rationale for Evaluation and Treatment Rehabilitation  SUBJECTIVE:                                                                                                                                                                                             SUBJECTIVE STATEMENT: Pt reports he was feeling more sore in his quads since last session, and has been very mindful of not locking his knees everyday.    Pt accompanied by: self    PERTINENT HISTORY: Patient presents to physical therapy for neuropathy. He underwent banding of his L UE fistula on 05/13/22 and scheduled for additional banding on 06/07/22. 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 Patient reports his balance is very unsteady due to neuropathy in feet, feels weak in LE's.   PAIN:  Denies pain, but reports stiffness   PRECAUTIONS: Fall  WEIGHT BEARING RESTRICTIONS Yes no lifting >5 lb in arm   FALLS: Has patient fallen in last 6 months? No  PLOF: Independent  PATIENT GOALS to be more steady and walk as normally as possible.   OBJECTIVE:   TODAY'S TREATMENT:   Unless otherwise stated, CGA was provided and gait belt donned in order to ensure pt safety  TherEx: (38 min) seated rest breaks provided throughout due to increase pt reported soreness   - : 14' with good gait mechanics, no episodes of foot drop  -SLS for goals:   L SLS with 2 finger support 25 sec   R SLS with 2 finger support 20 sec -2 x 60 sec: ankle rockers for improved ROM of DF/PF  -2 x 60 sec of  unsupported seated alt marches x 3 sec hold for hip flexor and core activation  -60 sec BUE supported standing psoas stretch in lunge position, pt cued  for posterior pelvic tilt; added to HEP -3 x 150' ambulation provided throughout session for cool-down between activities; pt reported feeling immediate difference in hips "feel looser and taller" after stretching   -Reviewed goals, HEP, purpose of therapy, and pt education re: psoas muscle passively shortening with sitting, pt reports decreased pain after stretching it (see above)  TherAct:  STS throughout session mod I  Manual: (4 minutes)  SPT provided hip flexor stretch 2 x 60 sec in thomas test position  SPT provided quad stretch 2 x 60 sec in sidelying for imporoved tissue extensibility from pt reported tightness and p!  PATIENT EDUCATION: Education details: Pt educated throughout session about proper posture and technique with exercises. Improved exercise technique, movement at target joints, use of target muscles after min to mod verbal, visual, tactile cues.  Person educated: Patient Education method: Explanation, Demonstration, Tactile cues, and Verbal cues Education comprehension: verbalized understanding, returned demonstration, verbal cues required, and tactile cues required  HOME EXERCISE PROGRAM: Access Code: [email or web address] URL: [email or web address] Date: 02/19/2023 Prepared by: Precious Bard Exercises - Seated Heel Toe Raises  - 1 x daily - 7 x weekly - 2 sets - 10 reps - 5 hold - Standing Tandem Balance with Counter Support  - 1 x daily - 7 x weekly - 2 sets - 2 reps - 30 hold - Standing March with Counter Support  - 1 x daily - 7 x weekly - 2 sets - 10 reps - 5 hold - Ankle Inversion Eversion Towel Slide  - 1 x daily - 7 x weekly - 1 sets - 15 reps - 5 hold - Standing Hip Flexor Stretch  - 1 x daily - 7 x weekly - 2 sets - 1 reps - 60 hold   Access Code: [email or web address] URL: [email or web address] Date:  06/03/2022 Prepared by: Precious Bard  Exercises - Seated Heel Toe Raises  - 1 x daily - 7 x weekly - 2 sets - 10 reps - 5 hold - Standing Tandem Balance with Counter Support  - 1 x daily - 7 x weekly - 2 sets - 2 reps - 30 hold - Standing March with Counter Support  - 1 x daily - 7 x weekly - 2 sets - 10 reps - 5 hold   GOALS: Goals reviewed with patient? Yes   SHORT TERM GOALS: Target date: 07/01/2022  Patient will be independent in home exercise program to improve strength/mobility for better functional independence with ADLs. Baseline:10/9; HEP given 2/21: HEP compliant  Goal status: MET   LONG TERM GOALS: Target date:04/28/2023    Patient will increase FOTO score to equal to or greater than   63%  to demonstrate statistically significant improvement in mobility and quality of life.  Baseline: 10/9: 53% 11/29: 58% 12/27: 51% 1/17: 56% 2/21: 53% 3/18: 60%  4/8 58%; 6/26: 60% Goal status: IN PROGRESS  2.  Patient (< 41 years old) will complete five times sit to stand test in < 10 seconds without UE support indicating an increased LE strength and improved balance. Baseline: 10/9: 19.6 seconds with walking stick; one LOB 11/29: 20.56 seconds 12/27: 13.4 seconds hands on knees 1/17: 13 seconds hands on knees  2/21: 14 seconds no hands 3/18: 12 seconds no hands. 4/8 11.3 sec UE on knees. 5/13: 10.94 sec pushing from thighs. 6/24: 10.08 sec, pushing from thighs  Goal status: IN PROGRESS  3.  Patient will increase Berg Balance score by > 45/56 to demonstrate decreased fall risk during functional  activities. Baseline: 10/9: 33/56 11/29: 40/10 27/25: 36/64 4/03:  47/42 2/21: 46/56 3/18: 48/56 Goal status: MET  +4.  Patient will increase 10 meter walk test to >1.51m/s as to improve gait speed for better community ambulation and to reduce fall risk. Baseline: 11/29: 1.19 m/s Goal status: MET  5.  Patient will increase ABC scale score >80% to demonstrate better functional mobility and  better confidence with ADLs.  Baseline: 10/9: 67% 11/29: 75.6% 12/27: 78%  2/21: 78% 3/18: 80% Goal status: MET  6. Patient will tolerate 5 seconds of single leg stance without loss of balance to improve ability to get in and out of shower safely. Baseline: 11/29: unable to stand on single leg safely 12/27: 2 seconds 1/17: 3 seconds 2/21: 3 seconds 3/18: 4 seconds. 4/8: 3 sec  5/13: LLE x 3 sec and RLE x 4 sec; 6/26:  Goal status: IN PROGRESS   6. Patient will increase six minute walk test distance to >1000 for progression to community ambulator and improve gait ability Baseline: 12/27: 845 ft with with walking stick 1/17: 751ft 2/21: 930 ft  3/18: 950 ft 4/8: 1143ft. 5/13: 1185 Goal status: met  7. Patient will increase dynamic gait index score to >19/24 as to demonstrate reduced fall risk and improved dynamic gait balance for better safety with community/home ambulation.  Baseline: 3/18:  9/24. 4/8: 16/24 5/13: 18/24 Goal status: in Progress  8. Patient will increase six minute walk test distance to >1500 for progression to community ambulator and improve gait ability Baseline: . 5/13: 1185; 6/26: 1145 with good gait mechanics  Goal status: IN PROGRESS    ASSESSMENT: CLINICAL IMPRESSION: Patient's condition has the potential to improve in response to therapy. Maximum improvement is yet to be obtained. The anticipated improvement is attainable and reasonable in a generally predictable time.  Patient reports he is better with stairs in the stairs safely with a rail, more in tune with body mechanics re: avoiding genu recurvatum and avoiding L hand clenching; grocery shopping with no difficulty, navigation of uneven surfaces is still difficult but has improved. During today's session, SPT dependently stretched pt's B quads and psoas with pt-reported immediate decrease in painful tightness of mm. HEP was updated to include psoas stretch, and plans to check back in with patient to see how that has  affected him daily. Pt continues to consistently improve at not hyperextending his knees throughout, reporting it makes all activities harder, with noted increase in fatigue. The pt will continue to benefit from skilled PT to make progress in BLE strength, balance, and coordination to return to PLOF and improve QoL.    OBJECTIVE IMPAIRMENTS Abnormal gait, decreased activity tolerance, decreased balance, decreased coordination, decreased endurance, decreased mobility, difficulty walking, decreased strength, impaired flexibility, impaired sensation, improper body mechanics, and pain.   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   REHAB POTENTIAL: Good  CLINICAL DECISION MAKING: Evolving/moderate complexity  EVALUATION COMPLEXITY: Moderate  PLAN: PT FREQUENCY: 2x/week  PT DURATION: 12 weeks  PLANNED INTERVENTIONS: Therapeutic exercises, Therapeutic activity, Neuromuscular re-education, Balance training, Gait training, Patient/Family education, Self Care, Joint mobilization, Stair training, Vestibular training, Canalith repositioning, Visual/preceptual remediation/compensation, DME instructions, Dry Needling, Cognitive remediation, Spinal mobilization, Cryotherapy, Moist heat,  Manual lymph drainage, Compression bandaging, Taping, Vasopneumatic device, Ultrasound, Manual therapy, and Re-evaluation  PLAN FOR NEXT SESSION:   Follow up re: psoas stretch; stairs, uneven surfaces (ambulation outside)  [place], SPT   This entire session was performed under direct supervision and direction of a licensed Estate agent . I have personally read, edited and approve of the note as written.  Precious Bard PT ,DPT Physical Therapist- [place]  [facility]

## 2023-02-19 ENCOUNTER — Ambulatory Visit: Payer: Medicare Other | Admitting: Occupational Therapy

## 2023-02-19 ENCOUNTER — Ambulatory Visit: Payer: Medicare Other

## 2023-02-19 DIAGNOSIS — R269 Unspecified abnormalities of gait and mobility: Secondary | ICD-10-CM

## 2023-02-19 DIAGNOSIS — M6281 Muscle weakness (generalized): Secondary | ICD-10-CM

## 2023-02-19 DIAGNOSIS — R278 Other lack of coordination: Secondary | ICD-10-CM

## 2023-02-19 DIAGNOSIS — R2681 Unsteadiness on feet: Secondary | ICD-10-CM

## 2023-02-19 DIAGNOSIS — R262 Difficulty in walking, not elsewhere classified: Secondary | ICD-10-CM

## 2023-02-20 ENCOUNTER — Encounter: Payer: Self-pay | Admitting: Occupational Therapy

## 2023-02-20 NOTE — Therapy (Signed)
OCCUPATIONAL THERAPY NEURO TREATMENT NOTE  Patient Name: Gregory Crane MRN: [id number] DOB:[birth year], 52 y.o., male  PCP: Dale [place] REFERRING PROVIDER: Dale Quimby  END OF SESSION:  OT End of Session - 02/20/23 1127     Visit Number 26    Number of Visits 44    OT Start Time 1015    OT Stop Time 1102    OT Time Calculation (min) 47 min    Activity Tolerance Patient tolerated treatment well    Behavior During Therapy WFL for tasks assessed/performed                  Past Medical History:  Diagnosis Date   Allergy    Anemia    Bell's palsy    Diabetes mellitus without complication (HCC)    diet controlled   Hypertension    Hypothyroidism    Kidney stones    Pseudotumor cerebri    Stroke [facility])    Past Surgical History:  Procedure Laterality Date   COLONOSCOPY WITH PROPOFOL N/A 03/30/2020   Procedure: COLONOSCOPY WITH PROPOFOL;  Surgeon: Regis Bill, MD;  Location: [facility] ENDOSCOPY;  Service: Endoscopy;  Laterality: N/A;   LOOP RECORDER INSERTION N/A 01/27/2018   Procedure: LOOP RECORDER INSERTION;  Surgeon: Duke Salvia, MD;  Location: [facility] INVASIVE CV LAB;  Service: Cardiovascular;  Laterality: N/A;   LUMBAR PUNCTURE     as child   NO PAST SURGERIES     TEE WITHOUT CARDIOVERSION N/A 01/07/2018   Procedure: TRANSESOPHAGEAL ECHOCARDIOGRAM (TEE);  Surgeon: Antonieta Iba, MD;  Location: [facility] ORS;  Service: Cardiovascular;  Laterality: N/A;   Patient Active Problem List   Diagnosis Date Noted   Unsteady gait 10/05/2022   Steal syndrome of dialysis vascular access (HCC) 05/28/2022   Hydronephrosis, left 05/04/2022   Type II diabetes mellitus with renal manifestations (HCC) 05/04/2022   Leukocytosis 05/04/2022   Pre-op evaluation 04/24/2022   Chest pain 01/13/2022   Diabetic retinopathy associated with diabetes mellitus due to underlying condition (HCC) 01/13/2022   ESRD on hemodialysis (HCC) 01/13/2022   Deafness in right ear 08/11/2021    Headache 07/07/2021   Hearing loss 07/07/2021   Open wound 01/25/2021   History of colon polyps 10/15/2020   Postoperative hemorrhage involving digestive system following digestive system procedure 09/19/2020   Acute cholecystitis without calculus 09/09/2020   Type 2 diabetes mellitus, with long-term current use of insulin (HCC) 09/09/2020   Cryptogenic stroke (HCC) 07/13/2020   History of loop recorder 07/13/2020   Weakness 06/18/2020   History of 2019 novel coronavirus disease (COVID-19) 05/20/2020   ESRD (end stage renal disease) (HCC) 04/04/2020   Pneumonia due to COVID-19 virus 04/03/2020   AKI (acute kidney injury) (HCC) 04/03/2020   Elevated troponin 04/03/2020   Acquired trigger finger 06/08/2019   Lymphedema 06/08/2019   Anemia 04/17/2019   Swelling of left lower extremity 01/10/2019   Facial droop 04/09/2018   Daytime somnolence 03/30/2018   Carotid artery disease (HCC) 02/03/2018   Intracranial vascular stenosis 09/12/2017   Cough 01/20/2017   Bell's palsy 11/10/2016   History of CVA (cerebrovascular accident) 11/10/2016   Benign localized hyperplasia of prostate with urinary obstruction 10/27/2016   History of nephrolithiasis 10/27/2016   TIA (transient ischemic attack) 10/20/2016   Near syncope 06/23/2016   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   Neuropathy 08/06/2015   Health care maintenance 08/06/2015   Essential hypertension 08/06/2015   Heme positive stool 10/10/2013   Hypothyroidism 10/10/2013   Microalbuminuria 10/10/2013  Hyperlipidemia 10/10/2013   B12 deficiency 10/10/2013   Diabetes (HCC) 07/04/2013   Environmental allergies 07/04/2013   ONSET DATE: May 2023  REFERRING DIAG: CVA  THERAPY DIAG:  Muscle weakness (generalized)  Other lack of coordination  Rationale for Evaluation and Treatment: Rehabilitation  SUBJECTIVE:  SUBJECTIVE STATEMENT:  Pt reports he still has difficulty with left small finger extending at times.  He did not have to have  dialysis Saturday and wants to check and see what his grip strength is today.  He is now able to lift up to 15# as of last week after going to the doctor.      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    PRECAUTIONS: Fall and Other: No strengthening exercises greater than 5-10 pounds in left arm.   Dialysis Tues, Th, Sat.  WEIGHT BEARING RESTRICTIONS: No  PAIN:  Are you having pain? No  FALLS: Has patient fallen in last 6 months? No  LIVING ENVIRONMENT: Lives with: alone Lives in: House/apartment Stairs: Yes: Internal: 15 steps; on right going up Has following equipment at home: Single point cane and Walker - 2 wheeled  PLOF: Independent  PATIENT GOALS: Pt reports he wants to have better use of his left hand, be able to do buttons and shoes.   OBJECTIVE:   HAND DOMINANCE: Right  ADLs: Overall ADLs: modified independent with most tasks Transfers/ambulation related to ADLs: Now walking without an assistive device, used cane in the past.  Eating: Independent with self feeding, difficulty with cutting food, requires increased focus and effort. Grooming: Independent UB Dressing: Difficulty with buttons, uses pullovers most of the time  LB Dressing: Difficulty with tying shoes Toileting: Independent Bathing: Independent Tub Shower transfers: Independent Equipment: none  IADLs: Shopping: independent,  uses a cart to push in the store Light housekeeping: modified independent Meal Prep: difficulty with tasks requiring use of left hand, bilateral hand use.   Community mobility: Ambulates  without use of assistive device, able to drive Medication management: Independent Financial management: Independent Handwriting: 90% legible  MOBILITY STATUS: Needs Assist: Pt has progressed with PT, used to use a cane but ambulating without an assistive device but still has balance difficulties.    POSTURE COMMENTS:    Sitting balance: Sits without UE support up to 30 sec  ACTIVITY TOLERANCE: Activity tolerance: limited activity tolerance  FUNCTIONAL OUTCOME MEASURES: FOTO: 53 12/11/22: FOTO: 55 01/15/23: 56  UPPER EXTREMITY ROM:    Active ROM Right eval Left eval  Shoulder flexion [facility]  Shoulder abduction [facility]  Shoulder adduction [facility]  Shoulder extension [facility]  Shoulder internal rotation [facility]  Shoulder external rotation [facility]  Elbow flexion [facility] WFL  Elbow extension [facility]  Wrist flexion [facility] WFL  Wrist extension [facility] WFL  Wrist ulnar deviation [facility] WFL  Wrist radial deviation [facility] WFL  Wrist pronation [facility] WFL  Wrist supination WFL WFL  (Blank rows = not tested)  UPPER EXTREMITY MMT:     MMT Right eval Left eval Left 01/15/23  Shoulder flexion 5/5 3+/5 4+  Shoulder abduction 5/5 3+/5 4+  Shoulder adduction  Shoulder extension     Shoulder internal rotation     Shoulder external rotation     Middle trapezius     Lower trapezius     Elbow flexion 5/5 3+/5 5  Elbow extension 5/5 3+/5 4+  Wrist flexion 5/5 3+/5 4+  Wrist extension 5/5 3+/5 4+  Wrist ulnar deviation 5/5 3+/5 4+  Wrist radial deviation 5/5 3+/5 4+  Wrist pronation 5/5 3+/5 4+  Wrist supination 5/5 3+/5 4+  (Blank rows = not tested)  Opposition of thumb to all digits on right, on left hand opposition to ring finger but cannot demonstrate to small finger.   HAND FUNCTION: Grip strength: Right: 44 lbs; Left: 35 lbs, Lateral pinch: Right: 17 lbs, Left: 16 lbs, 3 point pinch: Right: 14 lbs, Left: 0 lbs, and Tip pinch: Right 2 lbs, Left: 0 lbs 12/23/2022: Grip strength: Right: 50  lbs; Left: 43 lbs, Lateral pinch: Right: 10 lbs, Left: 8 lbs, 3 point pinch: Right:  9 lbs, Left: 8 lbs, and Tip pinch: Right 8 lbs, Left: 4 lbs 01/15/23: R grip 45 lbs, Left: 34 lbs; R lateral pinch 10 lbs, L 7 lbs; 3 point pinch R: 7 lbs, L 7 lbs; tip pinch R: 8 lbs, L 4 lbs (At dialysis yesterday, pt reports they pulled a lot of fluid and pt was so tired that he almost got sick) 02/17/2023: Grip strength: Right: 50 lbs; Left: 45  lbs, Lateral pinch: Right: 10  lbs, Left: 17 lbs, 3 point pinch: Right: 9  lbs, Left: 10 lbs, and Tip pinch: Right  8 lbs, Left: 5 lbs  COORDINATION: 9 Hole Peg test: Right: 36 sec; Left: 56 sec 12/23/2022: 9 Hole Peg test: Right: 32 sec; Left: 45 sec 01/15/23: R hand 26 sec, Left: 43 sec 02/17/23: R hand 39 sec, Left: 53 sec  TODAY'S TREATMENT: 02/17/2023                                                                                                                               Therapeutic Exercises:  Measurements taken this date for grip, pinch and coordination, see above in bold noted as 02/17/2023  Neuromuscular Reeducation: Pt seen this date with emphasis on manipulation of small objects, round, ball pegs to pick up from container and place into grid with left hand.  Pt moving items from fingertips to palm using translatory movements of the hand and then using the hand for storage.  Small finger tends to flex into palm and demonstrates difficulty with getting finger to extend to wrap around objects.  Requires tactile cues at the small finger to extend and then slightly flex.  Difficulty when attempting to move items from the ulnar side of the hand to thumb/radial side.         PATIENT EDUCATION: Education details: intrinsic strengthening for L hand; progress towards goals Person educated: Patient Education method: Explanation, demo, written handout Education comprehension: verbalized understanding, min  vc, tactile cues  HOME EXERCISE PROGRAM: Theraputty, tendon  glides, intrinsic strengthening with digit abd/add and lumbrical isometric strengthening.  GOALS: Goals reviewed with patient? Yes  SHORT TERM GOALS: Target date:  02/26/2023   Pt will demonstrate ability to hold and utilize utensils for cutting meat with modified independence. Baseline: difficulty with cutting, stabilizing item with fork for cutting; 01/15/23: pt reports he can cut meat, but it's awkward Goal status: IN PROGRESS  2.  Pt will demonstrate ability to obtain keys from left pocket without difficult and without dropping.   Baseline: pt struggles with reaching into left pocket to get keys; 5/22: still challenging but can do it without dropping Goal status: IN PROGRESS   LONG TERM GOALS: Target date: 04/09/2023   Pt will demonstrate HEP with modified independence.   Baseline: no current program for left UE. (Has green tband but needs educ on exercises); 01/15/23: using theraputty and has recently begun to focus on L hand intrinsic strengthening  Goal status: IN PROGRESS  2.  Pt will improve left UE coordination by 10 secs on 9 hole peg test to demonstrate shoe tying with modified independence.  Baseline: currently wearing slip on shoes and unable to tie shoes, left 9 hole peg test in 56 sec at eval; 01/15/23: pt reports he can tie shoes, sometimes requires multiple attempts, but can do it.  Often chooses to wear slip ons as this is still challenging; L 9 hole 43 sec Goal status: IN PROGRESS/ partially met  3.  Pt will demonstrate improved coordination in left hand to demonstrate buttoning buttons on clothing with modified independence.  Baseline: difficulty with buttons and has been wearing more pullovers to avoid challenge of buttons; 01/15/23: pt reports still challenging Goal status: IN PROGRESS  4.  Pt will demonstrate improvement in left grip strength by 5# to open jars and containers with modified independence and greater ease.   Baseline: Difficulty with opening jars and  containers with use of left hand at eval; 01/15/23: pt reports easier time opening a water bottle top without dicem, but uses dycem to open jars (increased L hand weakness as compared to last progress update, but pt reports increased weakness this week from having excess fluid pulled off him during dialysis and would like to re-measure on a Monday after pt has had a couple days off of dialysis). Goal status: IN PROGRESS  5.  Pt will improve left UE strength by 1 mm grade to hold and hand wash dishes with modified independence.  Baseline: difficulty with being able to hand wash dishes at eval, difficulty stabilizing dish; 01/15/23: easier with dishes, harder with a heavy pan Goal status: IN PROGRESS/partially met  6.  Pt will demonstrate FOTO score of 59 or greater to show a clinically relevant change in LUE to impact greater independence with daily ADL and IADL tasks at home and in the community.   Baseline: Eval: 53; 12/11/22: 55; 01/15/23: 56 Goal status: IN PROGRESS  ASSESSMENT:  CLINICAL IMPRESSION: Pt reporting he feel stronger today since he did not have to go to dialysis over the weekend which really tends to wear him out and make him feel weaker when they remove a lot of fluid.  Rechecked his measurements this date with grip, pinch and coordination skills as noted above.  Small finger tends to flex into palm and demonstrates difficulty with getting finger to extend to wrap around objects.  Requires tactile cues at the small finger to extend and then slightly flex.  Difficulty when attempting to move items from the ulnar side of the hand to thumb/radial side.  Pt demonstrates decreased left hand strength and musculature of the palm with loss of arches in the hand, flattening of the palm affecting his gripping, cupping motions of the left hand during functional tasks.     PERFORMANCE DEFICITS: in functional skills including ADLs, IADLs, coordination, dexterity, sensation, tone, ROM, strength,  flexibility, Fine motor control, balance, endurance, decreased knowledge of use of DME, and UE functional use, cognitive skills including, and psychosocial skills including environmental adaptation, habits, and routines and behaviors.   IMPAIRMENTS: are limiting patient from ADLs, IADLs, and social participation.   CO-MORBIDITIES: has co-morbidities such as CAD, ESRD with dialysis, deafness in right ear, diabetic retinopathy, HTN, neuropathy  that affects occupational performance. Patient will benefit from skilled OT to address above impairments and improve overall function.  MODIFICATION OR ASSISTANCE TO COMPLETE EVALUATION: Min-Moderate modification of tasks or assist with assess necessary to complete an evaluation.  OT OCCUPATIONAL PROFILE AND HISTORY: Comprehensive assessment: Review of records and extensive additional review of physical, cognitive, psychosocial history related to current functional performance.  CLINICAL DECISION MAKING: Moderate - several treatment options, min-mod task modification necessary  REHAB POTENTIAL: Good  EVALUATION COMPLEXITY: Moderate PLAN:  OT FREQUENCY: 2x/week  OT DURATION: 12 weeks  PLANNED INTERVENTIONS: self care/ADL training, therapeutic exercise, therapeutic activity, neuromuscular re-education, manual therapy, balance training, paraffin, moist heat, contrast bath, patient/family education, and DME and/or AE instructions  RECOMMENDED OTHER SERVICES: Pt is being seen by PT currently.   CONSULTED AND AGREED WITH PLAN OF CARE: Patient  PLAN FOR NEXT SESSION: grip, pinch and coordination skills.   Nishaan Stanke T Arne Cleveland, OTR/L, CLT  Larisha Vencill, OT 02/20/2023, 11:28 AM

## 2023-02-22 ENCOUNTER — Encounter: Payer: Self-pay | Admitting: Occupational Therapy

## 2023-02-22 NOTE — Therapy (Signed)
OCCUPATIONAL THERAPY NEURO TREATMENT NOTE  Patient Name: Gregory Crane MRN: [id number] DOB:09-16-1970, 52 y.o., male  PCP: [facility] [name] REFERRING PROVIDER: Dale [name]  END OF SESSION:  OT End of Session - 02/22/23 2220     Visit Number 27    Number of Visits 44    OT Start Time 1102    OT Stop Time 1150    OT Time Calculation (min) 48 min    Activity Tolerance Patient tolerated treatment well    Behavior During Therapy WFL for tasks assessed/performed                  Past Medical History:  Diagnosis Date   Allergy    Anemia    Bell's palsy    Diabetes mellitus without complication (HCC)    diet controlled   Hypertension    Hypothyroidism    Kidney stones    Pseudotumor cerebri    Stroke [facility])    Past Surgical History:  Procedure Laterality Date   COLONOSCOPY WITH PROPOFOL N/A 03/30/2020   Procedure: COLONOSCOPY WITH PROPOFOL;  Surgeon: Regis Bill, MD;  Location: [facility] ENDOSCOPY;  Service: Endoscopy;  Laterality: N/A;   LOOP RECORDER INSERTION N/A 01/27/2018   Procedure: LOOP RECORDER INSERTION;  Surgeon: Duke Salvia, MD;  Location: [facility] INVASIVE CV LAB;  Service: Cardiovascular;  Laterality: N/A;   LUMBAR PUNCTURE     as child   NO PAST SURGERIES     TEE WITHOUT CARDIOVERSION N/A 01/07/2018   Procedure: TRANSESOPHAGEAL ECHOCARDIOGRAM (TEE);  Surgeon: Antonieta Iba, MD;  Location: [facility] ORS;  Service: Cardiovascular;  Laterality: N/A;   Patient Active Problem List   Diagnosis Date Noted   Unsteady gait 10/05/2022   Steal syndrome of dialysis vascular access (HCC) 05/28/2022   Hydronephrosis, left 05/04/2022   Type II diabetes mellitus with renal manifestations (HCC) 05/04/2022   Leukocytosis 05/04/2022   Pre-op evaluation 04/24/2022   Chest pain 01/13/2022   Diabetic retinopathy associated with diabetes mellitus due to underlying condition (HCC) 01/13/2022   ESRD on hemodialysis (HCC) 01/13/2022   Deafness in right ear 08/11/2021    Headache 07/07/2021   Hearing loss 07/07/2021   Open wound 01/25/2021   History of colon polyps 10/15/2020   Postoperative hemorrhage involving digestive system following digestive system procedure 09/19/2020   Acute cholecystitis without calculus 09/09/2020   Type 2 diabetes mellitus, with long-term current use of insulin (HCC) 09/09/2020   Cryptogenic stroke (HCC) 07/13/2020   History of loop recorder 07/13/2020   Weakness 06/18/2020   History of 2019 novel coronavirus disease (COVID-19) 05/20/2020   ESRD (end stage renal disease) (HCC) 04/04/2020   Pneumonia due to COVID-19 virus 04/03/2020   AKI (acute kidney injury) (HCC) 04/03/2020   Elevated troponin 04/03/2020   Acquired trigger finger 06/08/2019   Lymphedema 06/08/2019   Anemia 04/17/2019   Swelling of left lower extremity 01/10/2019   Facial droop 04/09/2018   Daytime somnolence 03/30/2018   Carotid artery disease (HCC) 02/03/2018   Intracranial vascular stenosis 09/12/2017   Cough 01/20/2017   Bell's palsy 11/10/2016   History of CVA (cerebrovascular accident) 11/10/2016   Benign localized hyperplasia of prostate with urinary obstruction 10/27/2016   History of nephrolithiasis 10/27/2016   TIA (transient ischemic attack) 10/20/2016   Near syncope 06/23/2016   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   Neuropathy 08/06/2015   Health care maintenance 08/06/2015   Essential hypertension 08/06/2015   Heme positive stool 10/10/2013   Hypothyroidism 10/10/2013   Microalbuminuria 10/10/2013  Hyperlipidemia 10/10/2013   B12 deficiency 10/10/2013   Diabetes (HCC) 07/04/2013   Environmental allergies 07/04/2013   ONSET DATE: May 2023  REFERRING DIAG: CVA  THERAPY DIAG:  Muscle weakness (generalized)  Other lack of coordination  Rationale for Evaluation and Treatment: Rehabilitation  SUBJECTIVE:  SUBJECTIVE STATEMENT:  Pt reports he is doing well, did not get as tired today in PT as he did on Monday.  He reports he feels  like his grip strength is not really getting better, has been doing his red putty at home on a regular basis.    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    PRECAUTIONS: Fall and Other: No strengthening exercises greater than 5-10 pounds in left arm.   Dialysis Tues, Th, Sat.  WEIGHT BEARING RESTRICTIONS: No  PAIN:  Are you having pain? No  FALLS: Has patient fallen in last 6 months? No  LIVING ENVIRONMENT: Lives with: alone Lives in: House/apartment Stairs: Yes: Internal: 15 steps; on right going up Has following equipment at home: Single point cane and Walker - 2 wheeled  PLOF: Independent  PATIENT GOALS: Pt reports he wants to have better use of his left hand, be able to do buttons and shoes.   OBJECTIVE:   HAND DOMINANCE: Right  ADLs: Overall ADLs: modified independent with most tasks Transfers/ambulation related to ADLs: Now walking without an assistive device, used cane in the past.  Eating: Independent with self feeding, difficulty with cutting food, requires increased focus and effort. Grooming: Independent UB Dressing: Difficulty with buttons, uses pullovers most of the time  LB Dressing: Difficulty with tying shoes Toileting: Independent Bathing: Independent Tub Shower transfers: Independent Equipment: none  IADLs: Shopping: independent,  uses a cart to push in the store Light housekeeping: modified independent Meal Prep: difficulty with tasks requiring use of left hand, bilateral hand use.   Community mobility: Ambulates without use of assistive device, able to  drive Medication management: Independent Financial management: Independent Handwriting: 90% legible  MOBILITY STATUS: Needs Assist: Pt has progressed with PT, used to use a cane but ambulating without an assistive device but still has balance difficulties.    POSTURE COMMENTS:    Sitting balance: Sits without UE support up to 30 sec  ACTIVITY TOLERANCE: Activity tolerance: limited activity tolerance  FUNCTIONAL OUTCOME MEASURES: FOTO: 53 12/11/22: FOTO: 55 01/15/23: 56  UPPER EXTREMITY ROM:    Active ROM Right eval Left eval  Shoulder flexion [facility]  Shoulder abduction [facility]  Shoulder adduction [facility]  Shoulder extension [facility]  Shoulder internal rotation [facility]  Shoulder external rotation [facility]  Elbow flexion [facility] WFL  Elbow extension [facility]  Wrist flexion [facility] WFL  Wrist extension [facility] WFL  Wrist ulnar deviation [facility] WFL  Wrist radial deviation [facility] WFL  Wrist pronation [facility] WFL  Wrist supination WFL WFL  (Blank rows = not tested)  UPPER EXTREMITY MMT:     MMT Right eval Left eval Left 01/15/23  Shoulder flexion 5/5 3+/5 4+  Shoulder abduction 5/5 3+/5 4+  Shoulder adduction     Shoulder extension     Shoulder internal rotation  Shoulder external rotation     Middle trapezius     Lower trapezius     Elbow flexion 5/5 3+/5 5  Elbow extension 5/5 3+/5 4+  Wrist flexion 5/5 3+/5 4+  Wrist extension 5/5 3+/5 4+  Wrist ulnar deviation 5/5 3+/5 4+  Wrist radial deviation 5/5 3+/5 4+  Wrist pronation 5/5 3+/5 4+  Wrist supination 5/5 3+/5 4+  (Blank rows = not tested)  Opposition of thumb to all digits on right, on left hand opposition to ring finger but cannot demonstrate to small finger.   HAND FUNCTION: Grip strength: Right: 44 lbs; Left: 35 lbs, Lateral pinch: Right: 17 lbs, Left: 16 lbs, 3 point pinch: Right: 14 lbs, Left: 0 lbs, and Tip pinch: Right 2 lbs, Left: 0 lbs 12/23/2022: Grip strength: Right: 50 lbs; Left: 43 lbs, Lateral pinch: Right:  10 lbs, Left: 8 lbs, 3 point pinch: Right:  9 lbs, Left: 8 lbs, and Tip pinch: Right 8 lbs, Left: 4 lbs 01/15/23: R grip 45 lbs, Left: 34 lbs; R lateral pinch 10 lbs, L 7 lbs; 3 point pinch R: 7 lbs, L 7 lbs; tip pinch R: 8 lbs, L 4 lbs (At dialysis yesterday, pt reports they pulled a lot of fluid and pt was so tired that he almost got sick) 02/17/2023: Grip strength: Right: 50 lbs; Left: 45  lbs, Lateral pinch: Right: 10  lbs, Left: 17 lbs, 3 point pinch: Right: 9  lbs, Left: 10 lbs, and Tip pinch: Right  8 lbs, Left: 5 lbs  COORDINATION: 9 Hole Peg test: Right: 36 sec; Left: 56 sec 12/23/2022: 9 Hole Peg test: Right: 32 sec; Left: 45 sec 01/15/23: R hand 26 sec, Left: 43 sec 02/17/23: R hand 39 sec, Left: 53 sec  TODAY'S TREATMENT: 02/19/2023                                                                                                                               Therapeutic Exercises:   Pt with minimal changes to grip, would like to see left hand stronger for daily tasks and for holding and stabilizing items.  Pt upgraded to green resistive therapy with therapist demonstration and cues for proper form and technique for use in home program.  Pt performed gross gripping patterns, lateral pinch, 3 point pinch and 2 point pinch patterns.  Pt pinching off small amounts of putty to form balls with bilateral hands and then working to flatten with use of a variety of pinches.  Pt requires assist at the small finger for gripping patterns.  Placed putty on ulnar side of the hand and facilitated the use of small finger for gripping.  Pt demonstrates difficulty with the modulation of pressure of the hand.   Pt to perform putty 1-2 times a day for 10 reps each exercise, he may need to decrease reps if he has any hand pain or increased fatigue that prevents him from using hand  in daily tasks.    PATIENT EDUCATION: Education details: intrinsic strengthening for L hand; progress towards goals Person educated:  Patient Education method: Explanation, demo, written handout Education comprehension: verbalized understanding, min vc, tactile cues  HOME EXERCISE PROGRAM: Theraputty, tendon glides, intrinsic strengthening with digit abd/add and lumbrical isometric strengthening.  GOALS: Goals reviewed with patient? Yes  SHORT TERM GOALS: Target date:  02/26/2023   Pt will demonstrate ability to hold and utilize utensils for cutting meat with modified independence. Baseline: difficulty with cutting, stabilizing item with fork for cutting; 01/15/23: pt reports he can cut meat, but it's awkward Goal status: IN PROGRESS  2.  Pt will demonstrate ability to obtain keys from left pocket without difficult and without dropping.   Baseline: pt struggles with reaching into left pocket to get keys; 5/22: still challenging but can do it without dropping Goal status: IN PROGRESS   LONG TERM GOALS: Target date: 04/09/2023   Pt will demonstrate HEP with modified independence.   Baseline: no current program for left UE. (Has green tband but needs educ on exercises); 01/15/23: using theraputty and has recently begun to focus on L hand intrinsic strengthening  Goal status: IN PROGRESS  2.  Pt will improve left UE coordination by 10 secs on 9 hole peg test to demonstrate shoe tying with modified independence.  Baseline: currently wearing slip on shoes and unable to tie shoes, left 9 hole peg test in 56 sec at eval; 01/15/23: pt reports he can tie shoes, sometimes requires multiple attempts, but can do it.  Often chooses to wear slip ons as this is still challenging; L 9 hole 43 sec Goal status: IN PROGRESS/ partially met  3.  Pt will demonstrate improved coordination in left hand to demonstrate buttoning buttons on clothing with modified independence.  Baseline: difficulty with buttons and has been wearing more pullovers to avoid challenge of buttons; 01/15/23: pt reports still challenging Goal status: IN PROGRESS  4.   Pt will demonstrate improvement in left grip strength by 5# to open jars and containers with modified independence and greater ease.   Baseline: Difficulty with opening jars and containers with use of left hand at eval; 01/15/23: pt reports easier time opening a water bottle top without dicem, but uses dycem to open jars (increased L hand weakness as compared to last progress update, but pt reports increased weakness this week from having excess fluid pulled off him during dialysis and would like to re-measure on a Monday after pt has had a couple days off of dialysis). Goal status: IN PROGRESS  5.  Pt will improve left UE strength by 1 mm grade to hold and hand wash dishes with modified independence.  Baseline: difficulty with being able to hand wash dishes at eval, difficulty stabilizing dish; 01/15/23: easier with dishes, harder with a heavy pan Goal status: IN PROGRESS/partially met  6.  Pt will demonstrate FOTO score of 59 or greater to show a clinically relevant change in LUE to impact greater independence with daily ADL and IADL tasks at home and in the community.   Baseline: Eval: 53; 12/11/22: 55; 01/15/23: 56 Goal status: IN PROGRESS  ASSESSMENT:  CLINICAL IMPRESSION: Upgraded putty resistance this date to see if this will have an effect on his hand grip measurements over the next couple weeks.  Pt demonstrates difficulty with small finger to flex around putty and occasionally requires guiding/tactile cues for facilitation of  normal movement pattern during exercises.  Pt demonstrates difficulty with modulation  of pressure in left hand at times, will continue to work towards managing pressure while gripping and manipulating objects.  Will monitor grip and pinch measurements over the next couple weeks for increased strength.  Continue to work towards goals in plan of care to improve left UE function for daily tasks.     PERFORMANCE DEFICITS: in functional skills including ADLs, IADLs,  coordination, dexterity, sensation, tone, ROM, strength, flexibility, Fine motor control, balance, endurance, decreased knowledge of use of DME, and UE functional use, cognitive skills including, and psychosocial skills including environmental adaptation, habits, and routines and behaviors.   IMPAIRMENTS: are limiting patient from ADLs, IADLs, and social participation.   CO-MORBIDITIES: has co-morbidities such as CAD, ESRD with dialysis, deafness in right ear, diabetic retinopathy, HTN, neuropathy  that affects occupational performance. Patient will benefit from skilled OT to address above impairments and improve overall function.  MODIFICATION OR ASSISTANCE TO COMPLETE EVALUATION: Min-Moderate modification of tasks or assist with assess necessary to complete an evaluation.  OT OCCUPATIONAL PROFILE AND HISTORY: Comprehensive assessment: Review of records and extensive additional review of physical, cognitive, psychosocial history related to current functional performance.  CLINICAL DECISION MAKING: Moderate - several treatment options, min-mod task modification necessary  REHAB POTENTIAL: Good  EVALUATION COMPLEXITY: Moderate PLAN:  OT FREQUENCY: 2x/week  OT DURATION: 12 weeks  PLANNED INTERVENTIONS: self care/ADL training, therapeutic exercise, therapeutic activity, neuromuscular re-education, manual therapy, balance training, paraffin, moist heat, contrast bath, patient/family education, and DME and/or AE instructions  RECOMMENDED OTHER SERVICES: Pt is being seen by PT currently.   CONSULTED AND AGREED WITH PLAN OF CARE: Patient  PLAN FOR NEXT SESSION: grip, pinch and coordination skills.   Kailash Hinze T Arne Cleveland, OTR/L, CLT  Roshan Salamon, OT 02/22/2023, 10:20 PM

## 2023-02-24 NOTE — Therapy (Signed)
OUTPATIENT PHYSICAL THERAPY NEURO TREATMENT   Patient Name: Gregory Crane MRN: [id number] DOB:03-17-1971, 52 y.o., male Today's Date: 02/25/2023  PCP: Dale Belgrade MD REFERRING PROVIDER: Dale Carmichaels MD  PT End of Session - 02/25/23 1058     Visit Number 61    Number of Visits 80    Date for PT Re-Evaluation 04/28/23    Authorization Type Medicare    Authorization Time Period 11/11/22-02/03/23    Progress Note Due on Visit 60    PT Start Time 1100    PT Stop Time 1144    PT Time Calculation (min) 44 min    Equipment Utilized During Treatment Gait belt    Activity Tolerance Patient tolerated treatment well;No increased pain    Behavior During Therapy WFL for tasks assessed/performed              Past Medical History:  Diagnosis Date   Allergy    Anemia    Bell's palsy    Diabetes mellitus without complication (HCC)    diet controlled   Hypertension    Hypothyroidism    Kidney stones    Pseudotumor cerebri    Stroke [name])    Past Surgical History:  Procedure Laterality Date   COLONOSCOPY WITH PROPOFOL N/A 03/30/2020   Procedure: COLONOSCOPY WITH PROPOFOL;  Surgeon: Regis Bill, MD;  Location: [facility] ENDOSCOPY;  Service: Endoscopy;  Laterality: N/A;   LOOP RECORDER INSERTION N/A 01/27/2018   Procedure: LOOP RECORDER INSERTION;  Surgeon: Duke Salvia, MD;  Location: [facility] INVASIVE CV LAB;  Service: Cardiovascular;  Laterality: N/A;   LUMBAR PUNCTURE     as child   NO PAST SURGERIES     TEE WITHOUT CARDIOVERSION N/A 01/07/2018   Procedure: TRANSESOPHAGEAL ECHOCARDIOGRAM (TEE);  Surgeon: Antonieta Iba, MD;  Location: [facility] ORS;  Service: Cardiovascular;  Laterality: N/A;   Patient Active Problem List   Diagnosis Date Noted   Unsteady gait 10/05/2022   Steal syndrome of dialysis vascular access (HCC) 05/28/2022   Hydronephrosis, left 05/04/2022   Type II diabetes mellitus with renal manifestations (HCC) 05/04/2022   Leukocytosis 05/04/2022   Pre-op  evaluation 04/24/2022   Chest pain 01/13/2022   Diabetic retinopathy associated with diabetes mellitus due to underlying condition (HCC) 01/13/2022   ESRD on hemodialysis (HCC) 01/13/2022   Deafness in right ear 08/11/2021   Headache 07/07/2021   Hearing loss 07/07/2021   Open wound 01/25/2021   History of colon polyps 10/15/2020   Postoperative hemorrhage involving digestive system following digestive system procedure 09/19/2020   Acute cholecystitis without calculus 09/09/2020   Type 2 diabetes mellitus, with long-term current use of insulin (HCC) 09/09/2020   Cryptogenic stroke (HCC) 07/13/2020   History of loop recorder 07/13/2020   Weakness 06/18/2020   History of 2019 novel coronavirus disease (COVID-19) 05/20/2020   ESRD (end stage renal disease) (HCC) 04/04/2020   Pneumonia due to COVID-19 virus 04/03/2020   AKI (acute kidney injury) (HCC) 04/03/2020   Elevated troponin 04/03/2020   Acquired trigger finger 06/08/2019   Lymphedema 06/08/2019   Anemia 04/17/2019   Swelling of left lower extremity 01/10/2019   Facial droop 04/09/2018   Daytime somnolence 03/30/2018   Carotid artery disease (HCC) 02/03/2018   Intracranial vascular stenosis 09/12/2017   Cough 01/20/2017   Bell's palsy 11/10/2016   History of CVA (cerebrovascular accident) 11/10/2016   Benign localized hyperplasia of prostate with urinary obstruction 10/27/2016   History of nephrolithiasis 10/27/2016   TIA (transient ischemic attack) 10/20/2016  Near syncope 06/23/2016   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   Neuropathy 08/06/2015   Health care maintenance 08/06/2015   Essential hypertension 08/06/2015   Heme positive stool 10/10/2013   Hypothyroidism 10/10/2013   Microalbuminuria 10/10/2013   Hyperlipidemia 10/10/2013   B12 deficiency 10/10/2013   Diabetes (HCC) 07/04/2013   Environmental allergies 07/04/2013   ONSET DATE: 2-3 years  REFERRING DIAG: Neuropathy  THERAPY DIAG:  Muscle weakness  (generalized)  Other lack of coordination  Difficulty in walking, not elsewhere classified  Unsteadiness on feet  Rationale for Evaluation and Treatment Rehabilitation  SUBJECTIVE:                                                                                                                                                                                             SUBJECTIVE STATEMENT: Pt reported he had an incident of swelling over the weekend, but states it is better today and that his kidneys seemed to kick back on Sunday night.  Pt reports stretching every day and quad and hip soreness has subsided.   Pt accompanied by: self    PERTINENT HISTORY: Patient presents to physical therapy for neuropathy. He underwent banding of his L UE fistula on 05/13/22 and scheduled for additional banding on 06/07/22. 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 Patient reports his balance is very unsteady due to neuropathy in feet, feels weak in LE's.   PAIN:  Denies pain, but reports stiffness   PRECAUTIONS: Fall  WEIGHT BEARING RESTRICTIONS Yes no lifting >5 lb in arm   FALLS: Has patient fallen in last 6 months? No  PLOF: Independent  PATIENT GOALS to be more steady and walk as normally as possible.   OBJECTIVE:   TODAY'S TREATMENT:   Unless otherwise stated, CGA was provided and gait belt donned in order to ensure pt safety  TherEx: (23 min)  -psoas stretch 2 x 30 sec B  -soleus/gastroc stretch 2 x 30 sec B -4 x four 6" stairs with emphasis on eccentric control; pt had one moment of instability catching himself with second rail on R side. SPT provided minA for re-balance.  -20 x ankle rockers for strengthening of DF/PF on 1/2 foam roller; pt cued to push BLE into foam roller. 4/10 RPE  -20 x  unilateral of ", with RTB added around anterior ankle tied to chair leg for added resistance during PF -20 x each: eversion / inversion without supination on 1/2 foam roller; used hands to stabilize femur to avoid  hip IR/ER (added to HEP with pillow)  NMRE: (15 min)  -15 min ambulation outside for uneven surfaces for foot clearance and scanning of environment re: mulch, straw, grass, pavement etc. STS throughout; emphasis on quad activation during moments of instability   TherAct:  STS throughout session mod I   PATIENT EDUCATION: Education details: Pt educated throughout session about proper posture and technique with exercises. Improved exercise technique, movement at target joints, use of target muscles after min to mod verbal, visual, tactile cues.  Person educated: Patient Education method: Explanation, Demonstration, Tactile cues, and Verbal cues Education comprehension: verbalized understanding, returned demonstration, verbal cues required, and tactile cues required  HOME EXERCISE PROGRAM: Access Code: [email or web address] URL: [email or web address] Date: 02/19/2023 Prepared by: Precious Bard Exercises - Seated Heel Toe Raises  - 1 x daily - 7 x weekly - 2 sets - 10 reps - 5 hold - Standing Tandem Balance with Counter Support  - 1 x daily - 7 x weekly - 2 sets - 2 reps - 30 hold - Standing March with Counter Support  - 1 x daily - 7 x weekly - 2 sets - 10 reps - 5 hold - Ankle Inversion Eversion Towel Slide  - 1 x daily - 7 x weekly - 1 sets - 15 reps - 5 hold - Standing Hip Flexor Stretch  - 1 x daily - 7 x weekly - 2 sets - 1 reps - 60 hold   Access Code: [email or web address] URL: [email or web address] Date: 06/03/2022 Prepared by: Precious Bard  Exercises - Seated Heel Toe Raises  - 1 x daily - 7 x weekly - 2 sets - 10 reps - 5 hold - Standing Tandem Balance with Counter Support  - 1 x daily - 7 x weekly - 2 sets - 2 reps - 30 hold - Standing March with Counter Support  - 1 x  daily - 7 x weekly - 2 sets - 10 reps - 5 hold   GOALS: Goals reviewed with patient? Yes   SHORT TERM GOALS: Target date: 07/01/2022  Patient will be independent in home exercise program to improve strength/mobility for better functional independence with ADLs. Baseline:10/9; HEP given 2/21: HEP compliant  Goal status: MET   LONG TERM GOALS: Target date:04/28/2023    Patient will increase FOTO score to equal to or greater than   63%  to demonstrate statistically significant improvement in mobility and quality of life.  Baseline: 10/9: 53% 11/29: 58% 12/27: 51% 1/17: 56% 2/21: 53% 3/18: 60%  4/8 58%; 6/26: 60% Goal status: IN PROGRESS  2.  Patient (< 84 years old) will complete five times sit to stand test in < 10 seconds without UE support indicating an increased LE strength and improved balance. Baseline: 10/9: 19.6 seconds with walking stick; one LOB 11/29: 20.56 seconds 12/27: 13.4 seconds hands on knees 1/17: 13 seconds hands on knees  2/21: 14 seconds no hands 3/18: 12 seconds no hands. 4/8 11.3 sec UE on knees. 5/13: 10.94 sec pushing from thighs. 6/24: 10.08 sec, pushing from thighs  Goal status: IN PROGRESS  3.  Patient will increase Berg Balance score by > 45/56 to demonstrate decreased fall risk during functional activities. Baseline: 10/9: 33/56 11/29: 88/87 57/97: 28/20 6/01:  56/15 2/21: 46/56 3/18: 48/56 Goal status: MET  +4.  Patient will increase 10 meter walk test to >1.59m/s as to improve gait speed for better community ambulation and to reduce fall risk. Baseline: 11/29: 1.19 m/s Goal status: MET  5.  Patient will increase ABC scale score >80% to demonstrate better functional mobility and better confidence with ADLs.  Baseline: 10/9: 67% 11/29: 75.6% 12/27: 78%  2/21: 78% 3/18: 80% Goal status: MET  6. Patient will tolerate 5 seconds of single leg stance without loss of balance to improve ability to get in and out of shower safely. Baseline: 11/29: unable to  stand on single leg safely 12/27: 2 seconds 1/17: 3 seconds 2/21: 3 seconds 3/18: 4 seconds. 4/8: 3 sec  5/13: LLE x 3 sec and RLE x 4 sec; 6/26:  Goal status: IN PROGRESS   6. Patient will increase six minute walk test distance to >1000 for progression to community ambulator and improve gait ability Baseline: 12/27: 845 ft with with walking stick 1/17: 732ft 2/21: 930 ft  3/18: 950 ft 4/8: 1165ft. 5/13: 1185 Goal status: met  7. Patient will increase dynamic gait index score to >19/24 as to demonstrate reduced fall risk and improved dynamic gait balance for better safety with community/home ambulation.  Baseline: 3/18:  9/24. 4/8: 16/24 5/13: 18/24 Goal status: in Progress  8. Patient will increase six minute walk test distance to >1500 for progression to community ambulator and improve gait ability Baseline: . 5/13: 1185; 6/26: 1145 with good gait mechanics  Goal status: IN PROGRESS    ASSESSMENT: CLINICAL IMPRESSION: Pt presented to skilled PT reporting he had an incident of swelling over the weekend, which has mostly subsided. Activities progress today with outdoor ambulation for uneven surfaces, which continues to be a challenge to pt. Therex also emphasized calf strength with ankle stabilization, which will be progressed next session. The pt will continue to benefit from skilled PT to make progress in BLE strength, balance, and coordination to return to PLOF and improve QoL.    OBJECTIVE IMPAIRMENTS Abnormal gait, decreased activity tolerance, decreased balance, decreased coordination, decreased endurance, decreased mobility, difficulty walking, decreased strength, impaired flexibility, impaired sensation, improper body mechanics, and pain.   ACTIVITY LIMITATIONS carrying, lifting, bending, standing, squatting, sleeping, stairs, transfers, bed mobility, bathing, toileting, dressing, locomotion level, and caring for others  PARTICIPATION LIMITATIONS: meal prep, cleaning, laundry,  medication management, personal finances, interpersonal relationship, driving, shopping, community activity, and yard work  PERSONAL FACTORS Age, Fitness, Past/current experiences, Time since onset of injury/illness/exacerbation, Transportation, and 3+ comorbidities: CAD, cryptogenic stroke (2021), deafness in R ear, diabetic retinopathy, dialysis, ESRD, COVID, HTN, hypothyroidism, leukocytosis, nephrolithiasis, neuropathy, Pseudotumor cerebri 1986, sepsis, DM type I, DM type II  are also affecting patient's functional outcome.   REHAB POTENTIAL: Good  CLINICAL DECISION MAKING: Evolving/moderate complexity  EVALUATION COMPLEXITY: Moderate  PLAN: PT FREQUENCY: 2x/week  PT DURATION: 12 weeks  PLANNED INTERVENTIONS: Therapeutic exercises, Therapeutic activity, Neuromuscular re-education, Balance training, Gait training, Patient/Family education, Self Care, Joint mobilization, Stair training, Vestibular training, Canalith repositioning, Visual/preceptual remediation/compensation, DME instructions, Dry Needling, Cognitive remediation, Spinal mobilization, Cryotherapy, Moist heat, Manual lymph drainage, Compression bandaging, Taping, Vasopneumatic device, Ultrasound, Manual therapy, and Re-evaluation  PLAN FOR NEXT SESSION:   Follow up re: ankle stab.; calf strength, uneven surfaces  Judith Blonder, SPT   This entire session was performed under direct supervision and direction of a licensed Estate agent . I have personally read, edited and approve of the note as written.  Precious Bard PT ,DPT Physical Therapist- [facility]  [facility]

## 2023-02-25 ENCOUNTER — Ambulatory Visit: Payer: Medicare Other | Admitting: Occupational Therapy

## 2023-02-25 ENCOUNTER — Ambulatory Visit: Payer: Medicare Other | Attending: Internal Medicine

## 2023-02-25 DIAGNOSIS — M6281 Muscle weakness (generalized): Secondary | ICD-10-CM | POA: Diagnosis present

## 2023-02-25 DIAGNOSIS — R262 Difficulty in walking, not elsewhere classified: Secondary | ICD-10-CM | POA: Diagnosis present

## 2023-02-25 DIAGNOSIS — R2681 Unsteadiness on feet: Secondary | ICD-10-CM | POA: Insufficient documentation

## 2023-02-25 DIAGNOSIS — R278 Other lack of coordination: Secondary | ICD-10-CM | POA: Insufficient documentation

## 2023-02-25 DIAGNOSIS — R269 Unspecified abnormalities of gait and mobility: Secondary | ICD-10-CM | POA: Insufficient documentation

## 2023-02-25 NOTE — Therapy (Signed)
OCCUPATIONAL THERAPY NEURO TREATMENT NOTE  Patient Name: Gregory Crane MRN: [id number] DOB:16-Jul-1971, 52 y.o., male  PCP: Dale [place] REFERRING PROVIDER: Dale Hurley  END OF SESSION:  OT End of Session - 02/25/23 1212     Visit Number 28    Number of Visits 44    Date for OT Re-Evaluation 04/09/23    OT Start Time 1147    OT Stop Time 1230    OT Time Calculation (min) 43 min    Activity Tolerance Patient tolerated treatment well    Behavior During Therapy [facility] for tasks assessed/performed                  Past Medical History:  Diagnosis Date   Allergy    Anemia    Bell's palsy    Diabetes mellitus without complication (HCC)    diet controlled   Hypertension    Hypothyroidism    Kidney stones    Pseudotumor cerebri    Stroke [facility])    Past Surgical History:  Procedure Laterality Date   COLONOSCOPY WITH PROPOFOL N/A 03/30/2020   Procedure: COLONOSCOPY WITH PROPOFOL;  Surgeon: Regis Bill, MD;  Location: [facility] ENDOSCOPY;  Service: Endoscopy;  Laterality: N/A;   LOOP RECORDER INSERTION N/A 01/27/2018   Procedure: LOOP RECORDER INSERTION;  Surgeon: Duke Salvia, MD;  Location: [facility] INVASIVE CV LAB;  Service: Cardiovascular;  Laterality: N/A;   LUMBAR PUNCTURE     as child   NO PAST SURGERIES     TEE WITHOUT CARDIOVERSION N/A 01/07/2018   Procedure: TRANSESOPHAGEAL ECHOCARDIOGRAM (TEE);  Surgeon: Antonieta Iba, MD;  Location: [facility] ORS;  Service: Cardiovascular;  Laterality: N/A;   Patient Active Problem List   Diagnosis Date Noted   Unsteady gait 10/05/2022   Steal syndrome of dialysis vascular access (HCC) 05/28/2022   Hydronephrosis, left 05/04/2022   Type II diabetes mellitus with renal manifestations (HCC) 05/04/2022   Leukocytosis 05/04/2022   Pre-op evaluation 04/24/2022   Chest pain 01/13/2022   Diabetic retinopathy associated with diabetes mellitus due to underlying condition (HCC) 01/13/2022   ESRD on hemodialysis (HCC) 01/13/2022    Deafness in right ear 08/11/2021   Headache 07/07/2021   Hearing loss 07/07/2021   Open wound 01/25/2021   History of colon polyps 10/15/2020   Postoperative hemorrhage involving digestive system following digestive system procedure 09/19/2020   Acute cholecystitis without calculus 09/09/2020   Type 2 diabetes mellitus, with long-term current use of insulin (HCC) 09/09/2020   Cryptogenic stroke (HCC) 07/13/2020   History of loop recorder 07/13/2020   Weakness 06/18/2020   History of 2019 novel coronavirus disease (COVID-19) 05/20/2020   ESRD (end stage renal disease) (HCC) 04/04/2020   Pneumonia due to COVID-19 virus 04/03/2020   AKI (acute kidney injury) (HCC) 04/03/2020   Elevated troponin 04/03/2020   Acquired trigger finger 06/08/2019   Lymphedema 06/08/2019   Anemia 04/17/2019   Swelling of left lower extremity 01/10/2019   Facial droop 04/09/2018   Daytime somnolence 03/30/2018   Carotid artery disease (HCC) 02/03/2018   Intracranial vascular stenosis 09/12/2017   Cough 01/20/2017   Bell's palsy 11/10/2016   History of CVA (cerebrovascular accident) 11/10/2016   Benign localized hyperplasia of prostate with urinary obstruction 10/27/2016   History of nephrolithiasis 10/27/2016   TIA (transient ischemic attack) 10/20/2016   Near syncope 06/23/2016   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   Neuropathy 08/06/2015   Health care maintenance 08/06/2015   Essential hypertension 08/06/2015   Heme positive stool 10/10/2013  Hypothyroidism 10/10/2013   Microalbuminuria 10/10/2013   Hyperlipidemia 10/10/2013   B12 deficiency 10/10/2013   Diabetes (HCC) 07/04/2013   Environmental allergies 07/04/2013   ONSET DATE: May 2023  REFERRING DIAG: CVA  THERAPY DIAG:  Muscle weakness (generalized)  Other lack of coordination  Rationale for Evaluation and Treatment: Rehabilitation  SUBJECTIVE:  SUBJECTIVE STATEMENT:  Pt reports he is doing well today  Pt accompanied by:  self  PERTINENT HISTORY: Patient with history of CVA, banding of fistula in left arm with worsening of LUE functional use over time since May 2023, he has been seeing PT for balance deficits.  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    PRECAUTIONS: Fall and Other: No strengthening exercises greater than 5-10 pounds in left arm.   Dialysis Tues, Th, Sat.  WEIGHT BEARING RESTRICTIONS: No  PAIN:  Are you having pain? No  FALLS: Has patient fallen in last 6 months? No  LIVING ENVIRONMENT: Lives with: alone Lives in: House/apartment Stairs: Yes: Internal: 15 steps; on right going up Has following equipment at home: Single point cane and Walker - 2 wheeled  PLOF: Independent  PATIENT GOALS: Pt reports he wants to have better use of his left hand, be able to do buttons and shoes.   OBJECTIVE:   HAND DOMINANCE: Right  ADLs: Overall ADLs: modified independent with most tasks Transfers/ambulation related to ADLs: Now walking without an assistive device, used cane in the past.  Eating: Independent with self feeding, difficulty with cutting food, requires increased focus and effort. Grooming: Independent UB Dressing: Difficulty with buttons, uses pullovers most of the time  LB Dressing: Difficulty with tying shoes Toileting: Independent Bathing: Independent Tub Shower transfers: Independent Equipment: none  IADLs: Shopping: independent,  uses a cart to push in the store Light housekeeping: modified independent Meal Prep: difficulty with tasks requiring use of left hand, bilateral hand use.   Community mobility: Ambulates without use of assistive device, able to drive Medication management: Independent Financial management: Independent Handwriting: 90% legible  MOBILITY STATUS: Needs Assist: Pt  has progressed with PT, used to use a cane but ambulating without an assistive device but still has balance difficulties.    POSTURE COMMENTS:    Sitting balance: Sits without UE support up to 30 sec  ACTIVITY TOLERANCE: Activity tolerance: limited activity tolerance  FUNCTIONAL OUTCOME MEASURES: FOTO: 53 12/11/22: FOTO: 55 01/15/23: 56  UPPER EXTREMITY ROM:    Active ROM Right eval Left eval  Shoulder flexion [facility]  Shoulder abduction [facility]  Shoulder adduction [facility]  Shoulder extension [facility]  Shoulder internal rotation [facility]  Shoulder external rotation [facility]  Elbow flexion [facility] WFL  Elbow extension [facility]  Wrist flexion [facility] WFL  Wrist extension [facility] WFL  Wrist ulnar deviation [facility] WFL  Wrist radial deviation [facility] WFL  Wrist pronation [facility] WFL  Wrist supination WFL WFL  (Blank rows = not tested)  UPPER EXTREMITY MMT:     MMT Right eval Left eval Left 01/15/23  Shoulder flexion 5/5 3+/5 4+  Shoulder abduction 5/5 3+/5 4+  Shoulder adduction     Shoulder extension     Shoulder internal rotation     Shoulder external rotation     Middle trapezius     Lower trapezius     Elbow flexion 5/5 3+/5 5  Elbow extension 5/5  3+/5 4+  Wrist flexion 5/5 3+/5 4+  Wrist extension 5/5 3+/5 4+  Wrist ulnar deviation 5/5 3+/5 4+  Wrist radial deviation 5/5 3+/5 4+  Wrist pronation 5/5 3+/5 4+  Wrist supination 5/5 3+/5 4+  (Blank rows = not tested)  Opposition of thumb to all digits on right, on left hand opposition to ring finger but cannot demonstrate to small finger.   HAND FUNCTION: Grip strength: Right: 44 lbs; Left: 35 lbs, Lateral pinch: Right: 17 lbs, Left: 16 lbs, 3 point pinch: Right: 14 lbs, Left: 0 lbs, and Tip pinch: Right 2 lbs, Left: 0 lbs 12/23/2022: Grip strength: Right: 50 lbs; Left: 43 lbs, Lateral pinch: Right: 10 lbs, Left: 8 lbs, 3 point pinch: Right:  9 lbs, Left: 8 lbs, and Tip pinch: Right 8 lbs, Left: 4 lbs 01/15/23: R grip 45 lbs, Left: 34  lbs; R lateral pinch 10 lbs, L 7 lbs; 3 point pinch R: 7 lbs, L 7 lbs; tip pinch R: 8 lbs, L 4 lbs (At dialysis yesterday, pt reports they pulled a lot of fluid and pt was so tired that he almost got sick) 02/17/2023: Grip strength: Right: 50 lbs; Left: 45  lbs, Lateral pinch: Right: 10  lbs, Left: 17 lbs, 3 point pinch: Right: 9  lbs, Left: 10 lbs, and Tip pinch: Right  8 lbs, Left: 5 lbs  COORDINATION: 9 Hole Peg test: Right: 36 sec; Left: 56 sec 12/23/2022: 9 Hole Peg test: Right: 32 sec; Left: 45 sec 01/15/23: R hand 26 sec, Left: 43 sec 02/17/23: R hand 39 sec, Left: 53 sec  TODAY'S TREATMENT: 02/25/2023      Therapeutic Ex.:   Pt. worked on 2nd through 5th digit extension at the tabletop with holding for resistance. Pt. worked on reps of finger abduction at the tabletop surface. Pt. performed reps of intrinsic stretching while blocking the PIPS, and DIPs.    Neuromuscular re-ed:   Pt. worked on [facility] skills, and translatory movements of the hand left hand moving 1" foam rolls, and circular spheres back and forth through her hand starting proximally to distally. Pt. worked on bilateral [facility] skills needed to grasp small resistive beads. Pt. worked on connecting the beads using a 3pt. Pinch grasp. Pt. worked on disconnecting the resistive beads using a lateral pinch grasp, and 3pt. pinch grasp pt. worked on moving the line of beads through his hand. Pt. worked on moving a large drum stick through his left hand within his digits.    PATIENT EDUCATION: Education details: intrinsic strengthening for L hand Person educated: Patient Education method: Explanation, demo, written handout Education comprehension: verbalized understanding, min vc, tactile cues  HOME EXERCISE PROGRAM: Theraputty, tendon glides, intrinsic strengthening with digit abd/add and lumbrical isometric strengthening.  GOALS: Goals reviewed with patient? Yes  SHORT TERM GOALS: Target date:  02/26/2023   Pt will demonstrate  ability to hold and utilize utensils for cutting meat with modified independence. Baseline: difficulty with cutting, stabilizing item with fork for cutting; 01/15/23: pt reports he can cut meat, but it's awkward Goal status: IN PROGRESS  2.  Pt will demonstrate ability to obtain keys from left pocket without difficult and without dropping.   Baseline: pt struggles with reaching into left pocket to get keys; 5/22: still challenging but can do it without dropping Goal status: IN PROGRESS   LONG TERM GOALS: Target date: 04/09/2023   Pt will demonstrate HEP with modified independence.   Baseline: no current program for left UE. (  Has green tband but needs educ on exercises); 01/15/23: using theraputty and has recently begun to focus on L hand intrinsic strengthening  Goal status: IN PROGRESS  2.  Pt will improve left UE coordination by 10 secs on 9 hole peg test to demonstrate shoe tying with modified independence.  Baseline: currently wearing slip on shoes and unable to tie shoes, left 9 hole peg test in 56 sec at eval; 01/15/23: pt reports he can tie shoes, sometimes requires multiple attempts, but can do it.  Often chooses to wear slip ons as this is still challenging; L 9 hole 43 sec Goal status: IN PROGRESS/ partially met  3.  Pt will demonstrate improved coordination in left hand to demonstrate buttoning buttons on clothing with modified independence.  Baseline: difficulty with buttons and has been wearing more pullovers to avoid challenge of buttons; 01/15/23: pt reports still challenging Goal status: IN PROGRESS  4.  Pt will demonstrate improvement in left grip strength by 5# to open jars and containers with modified independence and greater ease.   Baseline: Difficulty with opening jars and containers with use of left hand at eval; 01/15/23: pt reports easier time opening a water bottle top without dicem, but uses dycem to open jars (increased L hand weakness as compared to last progress  update, but pt reports increased weakness this week from having excess fluid pulled off him during dialysis and would like to re-measure on a Monday after pt has had a couple days off of dialysis). Goal status: IN PROGRESS  5.  Pt will improve left UE strength by 1 mm grade to hold and hand wash dishes with modified independence.  Baseline: difficulty with being able to hand wash dishes at eval, difficulty stabilizing dish; 01/15/23: easier with dishes, harder with a heavy pan Goal status: IN PROGRESS/partially met  6.  Pt will demonstrate FOTO score of 59 or greater to show a clinically relevant change in LUE to impact greater independence with daily ADL and IADL tasks at home and in the community.   Baseline: Eval: 53; 12/11/22: 55; 01/15/23: 56 Goal status: IN PROGRESS  ASSESSMENT:  CLINICAL IMPRESSION:  Pt. continues to present with left hand intrinsic weakness, and requires verbal, and tactile cues for form and technique during the exercises. Pt. required support at the end of the drumstick when moving it through his hand. Pt. presents with difficulty disconnecting the resistive beads using bilateral hands. Plan to continue to work towards goals in plan of care to improve left UE function for daily tasks.     PERFORMANCE DEFICITS: in functional skills including ADLs, IADLs, coordination, dexterity, sensation, tone, ROM, strength, flexibility, Fine motor control, balance, endurance, decreased knowledge of use of DME, and UE functional use, cognitive skills including, and psychosocial skills including environmental adaptation, habits, and routines and behaviors.   IMPAIRMENTS: are limiting patient from ADLs, IADLs, and social participation.   CO-MORBIDITIES: has co-morbidities such as CAD, ESRD with dialysis, deafness in right ear, diabetic retinopathy, HTN, neuropathy  that affects occupational performance. Patient will benefit from skilled OT to address above impairments and improve overall  function.  MODIFICATION OR ASSISTANCE TO COMPLETE EVALUATION: Min-Moderate modification of tasks or assist with assess necessary to complete an evaluation.  OT OCCUPATIONAL PROFILE AND HISTORY: Comprehensive assessment: Review of records and extensive additional review of physical, cognitive, psychosocial history related to current functional performance.  CLINICAL DECISION MAKING: Moderate - several treatment options, min-mod task modification necessary  REHAB POTENTIAL: Good  EVALUATION COMPLEXITY:  Moderate PLAN:  OT FREQUENCY: 2x/week  OT DURATION: 12 weeks  PLANNED INTERVENTIONS: self care/ADL training, therapeutic exercise, therapeutic activity, neuromuscular re-education, manual therapy, balance training, paraffin, moist heat, contrast bath, patient/family education, and DME and/or AE instructions  RECOMMENDED OTHER SERVICES: Pt is being seen by PT currently.   CONSULTED AND AGREED WITH PLAN OF CARE: Patient  PLAN FOR NEXT SESSION: grip, pinch and coordination skills.   Olegario Messier, MS, OTR/L  02/25/2023

## 2023-02-28 ENCOUNTER — Encounter: Payer: Medicare Other | Admitting: Occupational Therapy

## 2023-02-28 ENCOUNTER — Ambulatory Visit: Payer: Medicare Other

## 2023-02-28 ENCOUNTER — Ambulatory Visit: Payer: Medicare Other | Admitting: Physical Therapy

## 2023-02-28 DIAGNOSIS — R262 Difficulty in walking, not elsewhere classified: Secondary | ICD-10-CM

## 2023-02-28 DIAGNOSIS — R278 Other lack of coordination: Secondary | ICD-10-CM

## 2023-02-28 DIAGNOSIS — M6281 Muscle weakness (generalized): Secondary | ICD-10-CM

## 2023-02-28 DIAGNOSIS — R269 Unspecified abnormalities of gait and mobility: Secondary | ICD-10-CM

## 2023-02-28 DIAGNOSIS — R2681 Unsteadiness on feet: Secondary | ICD-10-CM

## 2023-02-28 NOTE — Therapy (Signed)
OUTPATIENT PHYSICAL THERAPY NEURO TREATMENT   Patient Name: Gregory Crane MRN: [id number] DOB:[birth year], 52 y.o., male Today's Date: 02/28/2023  PCP: Dale Banning MD REFERRING PROVIDER: Dale [place] MD  PT End of Session - 02/28/23 1011     Visit Number 62    Number of Visits 80    Date for PT Re-Evaluation 04/28/23    Authorization Type Medicare    Authorization Time Period 11/11/22-02/03/23    Progress Note Due on Visit 60    PT Start Time 1015    PT Stop Time 1100    PT Time Calculation (min) 45 min    Equipment Utilized During Treatment Gait belt    Activity Tolerance Patient tolerated treatment well;No increased pain    Behavior During Therapy WFL for tasks assessed/performed              Past Medical History:  Diagnosis Date   Allergy    Anemia    Bell's palsy    Diabetes mellitus without complication (HCC)    diet controlled   Hypertension    Hypothyroidism    Kidney stones    Pseudotumor cerebri    Stroke [facility])    Past Surgical History:  Procedure Laterality Date   COLONOSCOPY WITH PROPOFOL N/A 03/30/2020   Procedure: COLONOSCOPY WITH PROPOFOL;  Surgeon: Regis Bill, MD;  Location: [facility] ENDOSCOPY;  Service: Endoscopy;  Laterality: N/A;   LOOP RECORDER INSERTION N/A 01/27/2018   Procedure: LOOP RECORDER INSERTION;  Surgeon: Duke Salvia, MD;  Location: [facility] INVASIVE CV LAB;  Service: Cardiovascular;  Laterality: N/A;   LUMBAR PUNCTURE     as child   NO PAST SURGERIES     TEE WITHOUT CARDIOVERSION N/A 01/07/2018   Procedure: TRANSESOPHAGEAL ECHOCARDIOGRAM (TEE);  Surgeon: Antonieta Iba, MD;  Location: [facility] ORS;  Service: Cardiovascular;  Laterality: N/A;   Patient Active Problem List   Diagnosis Date Noted   Unsteady gait 10/05/2022   Steal syndrome of dialysis vascular access (HCC) 05/28/2022   Hydronephrosis, left 05/04/2022   Type II diabetes mellitus with renal manifestations (HCC) 05/04/2022   Leukocytosis 05/04/2022   Pre-op  evaluation 04/24/2022   Chest pain 01/13/2022   Diabetic retinopathy associated with diabetes mellitus due to underlying condition (HCC) 01/13/2022   ESRD on hemodialysis (HCC) 01/13/2022   Deafness in right ear 08/11/2021   Headache 07/07/2021   Hearing loss 07/07/2021   Open wound 01/25/2021   History of colon polyps 10/15/2020   Postoperative hemorrhage involving digestive system following digestive system procedure 09/19/2020   Acute cholecystitis without calculus 09/09/2020   Type 2 diabetes mellitus, with long-term current use of insulin (HCC) 09/09/2020   Cryptogenic stroke (HCC) 07/13/2020   History of loop recorder 07/13/2020   Weakness 06/18/2020   History of 2019 novel coronavirus disease (COVID-19) 05/20/2020   ESRD (end stage renal disease) (HCC) 04/04/2020   Pneumonia due to COVID-19 virus 04/03/2020   AKI (acute kidney injury) (HCC) 04/03/2020   Elevated troponin 04/03/2020   Acquired trigger finger 06/08/2019   Lymphedema 06/08/2019   Anemia 04/17/2019   Swelling of left lower extremity 01/10/2019   Facial droop 04/09/2018   Daytime somnolence 03/30/2018   Carotid artery disease (HCC) 02/03/2018   Intracranial vascular stenosis 09/12/2017   Cough 01/20/2017   Bell's palsy 11/10/2016   History of CVA (cerebrovascular accident) 11/10/2016   Benign localized hyperplasia of prostate with urinary obstruction 10/27/2016   History of nephrolithiasis 10/27/2016   TIA (transient ischemic attack) 10/20/2016  Near syncope 06/23/2016   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   Neuropathy 08/06/2015   Health care maintenance 08/06/2015   Essential hypertension 08/06/2015   Heme positive stool 10/10/2013   Hypothyroidism 10/10/2013   Microalbuminuria 10/10/2013   Hyperlipidemia 10/10/2013   B12 deficiency 10/10/2013   Diabetes (HCC) 07/04/2013   Environmental allergies 07/04/2013   ONSET DATE: 2-3 years  REFERRING DIAG: Neuropathy  THERAPY DIAG:  Muscle weakness  (generalized)  Other lack of coordination  Unsteadiness on feet  Abnormality of gait and mobility  Difficulty in walking, not elsewhere classified  Rationale for Evaluation and Treatment Rehabilitation  SUBJECTIVE:                                                                                                                                                                                             SUBJECTIVE STATEMENT:  Pt reported he is doing well. No new medical update. No new falls trips or stumbles since last PT session.    Pt accompanied by: self    PERTINENT HISTORY: Patient presents to physical therapy for neuropathy. He underwent banding of his L UE fistula on 05/13/22 and scheduled for additional banding on 06/07/22. 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 Patient reports his balance is very unsteady due to neuropathy in feet, feels weak in LE's.   PAIN:  Denies pain, but reports stiffness   PRECAUTIONS: Fall  WEIGHT BEARING RESTRICTIONS Yes no lifting >5 lb in arm   FALLS: Has patient fallen in last 6 months? No  PLOF: Independent  PATIENT GOALS to be more steady and walk as normally as possible.   OBJECTIVE:   TODAY'S TREATMENT:   Unless otherwise stated, supervision assist to CGA was provided and gait belt donned in order to ensure pt safety  NUstep level 5 x 5 min, pt able to maintain  >70 steps per min with cues for full ROM in BLE and to improved L hip alignment to reduce excessive ER. Pt rates RPE 5/10 upon completion   Half bolster ankle DF/PF with light UE support on rail 2 x 20  Small rocker board Lateral control 2x20 with cues light to no UE support on rail with min cues from PT for improved weight shift over the RLE.  Sit<>stand pushing from thighsx 8   Sit<>stand no UE support x 5 with tactile cues for neutral trunk position to prevent posterior and anterior LOB Isometric hip adduction to squeeze peanut ball 2 x 15 bil.  Sit<>stand with  ball toss off wall, no push from sitting position 2 x 5.  Min cues from PT to reduced use of momentum to complete movements and increase activation of target muslces groups, especially with sit<>stand exercise, as well as proper activation of hip adductors throughout movement. Marland Kitchen      PATIENT EDUCATION: Education details: Pt educated throughout session about proper posture and technique with exercises. Improved exercise technique, movement at target joints, use of target muscles after min to mod verbal, visual, tactile cues.  Person educated: Patient Education method: Explanation, Demonstration, Tactile cues, and Verbal cues Education comprehension: verbalized understanding, returned demonstration, verbal cues required, and tactile cues required  HOME EXERCISE PROGRAM: Access Code: [email or web address] URL: [email or web address] Date: 02/19/2023 Prepared by: Precious Bard Exercises - Seated Heel Toe Raises  - 1 x daily - 7 x weekly - 2 sets - 10 reps - 5 hold - Standing Tandem Balance with Counter Support  - 1 x daily - 7 x weekly - 2 sets - 2 reps - 30 hold - Standing March with Counter Support  - 1 x daily - 7 x weekly - 2 sets - 10 reps - 5 hold - Ankle Inversion Eversion Towel Slide  - 1 x daily - 7 x weekly - 1 sets - 15 reps - 5 hold - Standing Hip Flexor Stretch  - 1 x daily - 7 x weekly - 2 sets - 1 reps - 60 hold   Access Code: [email or web address] URL: [email or web address] Date: 06/03/2022 Prepared by: Precious Bard  Exercises - Seated Heel Toe Raises  - 1 x daily - 7 x weekly - 2 sets - 10 reps - 5 hold - Standing Tandem Balance with Counter Support  - 1 x daily - 7 x weekly - 2 sets - 2 reps - 30 hold - Standing March with Counter Support  - 1 x daily - 7 x weekly - 2 sets - 10 reps - 5  hold   GOALS: Goals reviewed with patient? Yes   SHORT TERM GOALS: Target date: 07/01/2022  Patient will be independent in home exercise program to improve strength/mobility for better functional independence with ADLs. Baseline:10/9; HEP given 2/21: HEP compliant  Goal status: MET   LONG TERM GOALS: Target date:04/28/2023    Patient will increase FOTO score to equal to or greater than   63%  to demonstrate statistically significant improvement in mobility and quality of life.  Baseline: 10/9: 53% 11/29: 58% 12/27: 51% 1/17: 56% 2/21: 53% 3/18: 60%  4/8 58%; 6/26: 60% Goal status: IN PROGRESS  2.  Patient (< 36 years old) will complete five times sit to stand test in < 10 seconds without UE support indicating an increased LE strength and improved balance. Baseline: 10/9: 19.6 seconds with walking stick; one LOB 11/29: 20.56 seconds 12/27: 13.4 seconds hands on knees 1/17: 13 seconds hands on knees  2/21: 14 seconds no hands 3/18: 12 seconds no hands. 4/8 11.3 sec UE on knees. 5/13: 10.94 sec pushing from thighs. 6/24: 10.08 sec, pushing from thighs  Goal status: IN PROGRESS  3.  Patient will increase Berg Balance score by > 45/56 to demonstrate decreased fall risk during functional activities. Baseline: 10/9: 33/56 11/29: 16/10 96/04: 54/09 8/11:  91/47 2/21: 46/56 3/18: 48/56 Goal status: MET  +4.  Patient will increase 10 meter walk test to >1.72m/s as to improve gait speed for better community ambulation and to reduce fall risk. Baseline: 11/29: 1.19 m/s Goal status: MET  5.  Patient will increase ABC scale score >80% to demonstrate better functional mobility and better confidence with ADLs.  Baseline: 10/9: 67% 11/29: 75.6% 12/27: 78%  2/21: 78% 3/18: 80% Goal status: MET  6. Patient will tolerate 5 seconds of single leg stance without loss of balance to improve ability to get in and out of shower safely. Baseline: 11/29: unable to stand on single leg safely 12/27: 2 seconds  1/17: 3 seconds 2/21: 3 seconds 3/18: 4 seconds. 4/8: 3 sec  5/13: LLE x 3 sec and RLE x 4 sec; 6/26:  Goal status: IN PROGRESS   6. Patient will increase six minute walk test distance to >1000 for progression to community ambulator and improve gait ability Baseline: 12/27: 845 ft with with walking stick 1/17: 732ft 2/21: 930 ft  3/18: 950 ft 4/8: 117ft. 5/13: 1185 Goal status: met  7. Patient will increase dynamic gait index score to >19/24 as to demonstrate reduced fall risk and improved dynamic gait balance for better safety with community/home ambulation.  Baseline: 3/18:  9/24. 4/8: 16/24 5/13: 18/24 Goal status: in Progress  8. Patient will increase six minute walk test distance to >1500 for progression to community ambulator and improve gait ability Baseline: . 5/13: 1185; 6/26: 1145 with good gait mechanics  Goal status: IN PROGRESS    ASSESSMENT: CLINICAL IMPRESSION: Pt presented to skilled PT with good motivation for improved balance and BLE strengthening. States that he has been less motivated to maintain proper diet over the last few weeks and has had mild weight gain as result. Educated in benefits of maintaining healthy diet given physical and medical comorbidities . PT treatment focused on BLE strengthening and improved activation of hip and ankle muscles to reduce reliance on compensatory movements with gait, transfers, and balancing tasks. The pt will continue to benefit from skilled PT to make progress in BLE strength, balance, and coordination to return to PLOF and improve QoL.    OBJECTIVE IMPAIRMENTS Abnormal gait, decreased activity tolerance, decreased balance, decreased coordination, decreased endurance, decreased mobility, difficulty walking, decreased strength, impaired flexibility, impaired sensation, improper body mechanics, and pain.   ACTIVITY LIMITATIONS carrying, lifting, bending, standing, squatting, sleeping, stairs, transfers, bed mobility, bathing,  toileting, dressing, locomotion level, and caring for others  PARTICIPATION LIMITATIONS: meal prep, cleaning, laundry, medication management, personal finances, interpersonal relationship, driving, shopping, community activity, and yard work  PERSONAL FACTORS Age, Fitness, Past/current experiences, Time since onset of injury/illness/exacerbation, Transportation, and 3+ comorbidities: CAD, cryptogenic stroke (2021), deafness in R ear, diabetic retinopathy, dialysis, ESRD, COVID, HTN, hypothyroidism, leukocytosis, nephrolithiasis, neuropathy, Pseudotumor cerebri 1986, sepsis, DM type I, DM type II  are also affecting patient's functional outcome.   REHAB POTENTIAL: Good  CLINICAL DECISION MAKING: Evolving/moderate complexity  EVALUATION COMPLEXITY: Moderate  PLAN: PT FREQUENCY: 2x/week  PT DURATION: 12 weeks  PLANNED INTERVENTIONS: Therapeutic exercises, Therapeutic activity, Neuromuscular re-education, Balance training, Gait training, Patient/Family education, Self Care, Joint mobilization, Stair training, Vestibular training, Canalith repositioning, Visual/preceptual remediation/compensation, DME instructions, Dry Needling, Cognitive remediation, Spinal mobilization, Cryotherapy, Moist heat, Manual lymph drainage, Compression bandaging, Taping, Vasopneumatic device, Ultrasound, Manual therapy, and Re-evaluation  PLAN FOR NEXT SESSION:   Continue ankle stab.; calf strength, uneven surfaces  Grier Rocher PT, DPT  Physical Therapist - [facility]  [facility]  11:14 AM 02/28/23

## 2023-03-02 NOTE — Therapy (Signed)
OCCUPATIONAL THERAPY NEURO TREATMENT NOTE  Patient Name: Gregory Crane MRN: [id number] DOB:[birth year], 52 y.o., male  PCP: Dale  REFERRING PROVIDER: Dale   END OF SESSION:  OT End of Session - 03/02/23 2130     Visit Number 29    Number of Visits 44    Date for OT Re-Evaluation 04/09/23    Progress Note Due on Visit 10    OT Start Time 1100    OT Stop Time 1145    OT Time Calculation (min) 45 min    Activity Tolerance Patient tolerated treatment well    Behavior During Therapy WFL for tasks assessed/performed                  Past Medical History:  Diagnosis Date   Allergy    Anemia    Bell's palsy    Diabetes mellitus without complication (HCC)    diet controlled   Hypertension    Hypothyroidism    Kidney stones    Pseudotumor cerebri    Stroke [facility])    Past Surgical History:  Procedure Laterality Date   COLONOSCOPY WITH PROPOFOL N/A 03/30/2020   Procedure: COLONOSCOPY WITH PROPOFOL;  Surgeon: Regis Bill, MD;  Location: [facility] ENDOSCOPY;  Service: Endoscopy;  Laterality: N/A;   LOOP RECORDER INSERTION N/A 01/27/2018   Procedure: LOOP RECORDER INSERTION;  Surgeon: Duke Salvia, MD;  Location: [facility] INVASIVE CV LAB;  Service: Cardiovascular;  Laterality: N/A;   LUMBAR PUNCTURE     as child   NO PAST SURGERIES     TEE WITHOUT CARDIOVERSION N/A 01/07/2018   Procedure: TRANSESOPHAGEAL ECHOCARDIOGRAM (TEE);  Surgeon: Antonieta Iba, MD;  Location: [facility] ORS;  Service: Cardiovascular;  Laterality: N/A;   Patient Active Problem List   Diagnosis Date Noted   Unsteady gait 10/05/2022   Steal syndrome of dialysis vascular access (HCC) 05/28/2022   Hydronephrosis, left 05/04/2022   Type II diabetes mellitus with renal manifestations (HCC) 05/04/2022   Leukocytosis 05/04/2022   Pre-op evaluation 04/24/2022   Chest pain 01/13/2022   Diabetic retinopathy associated with diabetes mellitus due to underlying condition (HCC) 01/13/2022   ESRD  on hemodialysis (HCC) 01/13/2022   Deafness in right ear 08/11/2021   Headache 07/07/2021   Hearing loss 07/07/2021   Open wound 01/25/2021   History of colon polyps 10/15/2020   Postoperative hemorrhage involving digestive system following digestive system procedure 09/19/2020   Acute cholecystitis without calculus 09/09/2020   Type 2 diabetes mellitus, with long-term current use of insulin (HCC) 09/09/2020   Cryptogenic stroke (HCC) 07/13/2020   History of loop recorder 07/13/2020   Weakness 06/18/2020   History of 2019 novel coronavirus disease (COVID-19) 05/20/2020   ESRD (end stage renal disease) (HCC) 04/04/2020   Pneumonia due to COVID-19 virus 04/03/2020   AKI (acute kidney injury) (HCC) 04/03/2020   Elevated troponin 04/03/2020   Acquired trigger finger 06/08/2019   Lymphedema 06/08/2019   Anemia 04/17/2019   Swelling of left lower extremity 01/10/2019   Facial droop 04/09/2018   Daytime somnolence 03/30/2018   Carotid artery disease (HCC) 02/03/2018   Intracranial vascular stenosis 09/12/2017   Cough 01/20/2017   Bell's palsy 11/10/2016   History of CVA (cerebrovascular accident) 11/10/2016   Benign localized hyperplasia of prostate with urinary obstruction 10/27/2016   History of nephrolithiasis 10/27/2016   TIA (transient ischemic attack) 10/20/2016   Near syncope 06/23/2016   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   Neuropathy 08/06/2015   Health care maintenance 08/06/2015  Essential hypertension 08/06/2015   Heme positive stool 10/10/2013   Hypothyroidism 10/10/2013   Microalbuminuria 10/10/2013   Hyperlipidemia 10/10/2013   B12 deficiency 10/10/2013   Diabetes (HCC) 07/04/2013   Environmental allergies 07/04/2013   ONSET DATE: May 2023  REFERRING DIAG: CVA  THERAPY DIAG:  Muscle weakness (generalized)  Other lack of coordination  Rationale for Evaluation and Treatment: Rehabilitation  SUBJECTIVE:  SUBJECTIVE STATEMENT:  Pt reports he's not sure how much  his L hand is improving.  OT reviewed progress towards goals with pt and pt was able to acknowledge some slow but steady gains.   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    PRECAUTIONS: Fall and Other: No strengthening exercises greater than 5-10 pounds in left arm.   Dialysis Tues, Th, Sat.  WEIGHT BEARING RESTRICTIONS: No  PAIN:  Are you having pain? No  FALLS: Has patient fallen in last 6 months? No  LIVING ENVIRONMENT: Lives with: alone Lives in: House/apartment Stairs: Yes: Internal: 15 steps; on right going up Has following equipment at home: Single point cane and Walker - 2 wheeled  PLOF: Independent  PATIENT GOALS: Pt reports he wants to have better use of his left hand, be able to do buttons and shoes.   OBJECTIVE:   HAND DOMINANCE: Right  ADLs: Overall ADLs: modified independent with most tasks Transfers/ambulation related to ADLs: Now walking without an assistive device, used cane in the past.  Eating: Independent with self feeding, difficulty with cutting food, requires increased focus and effort. Grooming: Independent UB Dressing: Difficulty with buttons, uses pullovers most of the time  LB Dressing: Difficulty with tying shoes Toileting: Independent Bathing: Independent Tub Shower transfers: Independent Equipment: none  IADLs: Shopping: independent,  uses a cart to push in the store Light housekeeping: modified independent Meal Prep: difficulty with tasks requiring use of left hand, bilateral hand use.   Community mobility: Ambulates without use of assistive  device, able to drive Medication management: Independent Financial management: Independent Handwriting: 90% legible  MOBILITY STATUS: Needs Assist: Pt has progressed with PT, used to use a cane but ambulating without an assistive device but still has balance difficulties.    POSTURE COMMENTS:    Sitting balance: Sits without UE support up to 30 sec  ACTIVITY TOLERANCE: Activity tolerance: limited activity tolerance  FUNCTIONAL OUTCOME MEASURES: FOTO: 53 12/11/22: FOTO: 55 01/15/23: 56  UPPER EXTREMITY ROM:    Active ROM Right eval Left eval  Shoulder flexion [facility]  Shoulder abduction [facility]  Shoulder adduction [facility]  Shoulder extension [facility]  Shoulder internal rotation [facility]  Shoulder external rotation [facility]  Elbow flexion [facility] WFL  Elbow extension [facility]  Wrist flexion [facility] WFL  Wrist extension [facility] WFL  Wrist ulnar deviation [facility] WFL  Wrist radial deviation [facility] WFL  Wrist pronation [facility] WFL  Wrist supination WFL WFL  (Blank rows = not tested)  UPPER EXTREMITY MMT:     MMT Right eval Left eval Left 01/15/23  Shoulder flexion 5/5 3+/5 4+  Shoulder abduction 5/5 3+/5 4+  Shoulder adduction     Shoulder extension  Shoulder internal rotation     Shoulder external rotation     Middle trapezius     Lower trapezius     Elbow flexion 5/5 3+/5 5  Elbow extension 5/5 3+/5 4+  Wrist flexion 5/5 3+/5 4+  Wrist extension 5/5 3+/5 4+  Wrist ulnar deviation 5/5 3+/5 4+  Wrist radial deviation 5/5 3+/5 4+  Wrist pronation 5/5 3+/5 4+  Wrist supination 5/5 3+/5 4+  (Blank rows = not tested)  Opposition of thumb to all digits on right, on left hand opposition to ring finger but cannot demonstrate to small finger.   HAND FUNCTION: Grip strength: Right: 44 lbs; Left: 35 lbs, Lateral pinch: Right: 17 lbs, Left: 16 lbs, 3 point pinch: Right: 14 lbs, Left: 0 lbs, and Tip pinch: Right 2 lbs, Left: 0 lbs 12/23/2022: Grip strength: Right: 50 lbs; Left: 43 lbs, Lateral  pinch: Right: 10 lbs, Left: 8 lbs, 3 point pinch: Right:  9 lbs, Left: 8 lbs, and Tip pinch: Right 8 lbs, Left: 4 lbs 01/15/23: R grip 45 lbs, Left: 34 lbs; R lateral pinch 10 lbs, L 7 lbs; 3 point pinch R: 7 lbs, L 7 lbs; tip pinch R: 8 lbs, L 4 lbs (At dialysis yesterday, pt reports they pulled a lot of fluid and pt was so tired that he almost got sick) 02/17/2023: Grip strength: Right: 50 lbs; Left: 45  lbs, Lateral pinch: Right: 10  lbs, Left: 17 lbs, 3 point pinch: Right: 9  lbs, Left: 10 lbs, and Tip pinch: Right  8 lbs, Left: 5 lbs  COORDINATION: 9 Hole Peg test: Right: 36 sec; Left: 56 sec 12/23/2022: 9 Hole Peg test: Right: 32 sec; Left: 45 sec 01/15/23: R hand 26 sec, Left: 43 sec 02/17/23: R hand 39 sec, Left: 53 sec  TODAY'S TREATMENT: 02/28/2023    Therapeutic Exercise: L hand strengthening with intrinsic focus. -AROM/AAROM L hand abd/add 3 sets 10 reps; provided slight manual resistance within available range of abd -Resisted digit extension with red rubber band and manual assist from OT for MP and PIP extension of all L hand digits -AAROM L hand thumb to 5th digit opposition 2 sets 10 reps  -Facilitated lumbrical grasp with L forearm supinated, holding tennis ball within thumb and fingertips.  Practiced holding against a tug.  Not yet able yet able to manage this with a piece of paper without PIP flexion.  PATIENT EDUCATION: Education details: intrinsic strengthening for L hand Person educated: Patient Education method: Explanation, demo, written handout Education comprehension: verbalized understanding, min vc, tactile cues  HOME EXERCISE PROGRAM: Theraputty, tendon glides, intrinsic strengthening with digit abd/add and lumbrical isometric strengthening.  GOALS: Goals reviewed with patient? Yes  SHORT TERM GOALS: Target date:  02/26/2023   Pt will demonstrate ability to hold and utilize utensils for cutting meat with modified independence. Baseline: difficulty with cutting,  stabilizing item with fork for cutting; 01/15/23: pt reports he can cut meat, but it's awkward Goal status: IN PROGRESS  2.  Pt will demonstrate ability to obtain keys from left pocket without difficult and without dropping.   Baseline: pt struggles with reaching into left pocket to get keys; 5/22: still challenging but can do it without dropping Goal status: IN PROGRESS   LONG TERM GOALS: Target date: 04/09/2023   Pt will demonstrate HEP with modified independence.   Baseline: no current program for left UE. (Has green tband but needs educ on exercises); 01/15/23: using theraputty and has recently begun to  focus on L hand intrinsic strengthening  Goal status: IN PROGRESS  2.  Pt will improve left UE coordination by 10 secs on 9 hole peg test to demonstrate shoe tying with modified independence.  Baseline: currently wearing slip on shoes and unable to tie shoes, left 9 hole peg test in 56 sec at eval; 01/15/23: pt reports he can tie shoes, sometimes requires multiple attempts, but can do it.  Often chooses to wear slip ons as this is still challenging; L 9 hole 43 sec Goal status: IN PROGRESS/ partially met  3.  Pt will demonstrate improved coordination in left hand to demonstrate buttoning buttons on clothing with modified independence.  Baseline: difficulty with buttons and has been wearing more pullovers to avoid challenge of buttons; 01/15/23: pt reports still challenging Goal status: IN PROGRESS  4.  Pt will demonstrate improvement in left grip strength by 5# to open jars and containers with modified independence and greater ease.   Baseline: Difficulty with opening jars and containers with use of left hand at eval; 01/15/23: pt reports easier time opening a water bottle top without dicem, but uses dycem to open jars (increased L hand weakness as compared to last progress update, but pt reports increased weakness this week from having excess fluid pulled off him during dialysis and would like  to re-measure on a Monday after pt has had a couple days off of dialysis). Goal status: IN PROGRESS  5.  Pt will improve left UE strength by 1 mm grade to hold and hand wash dishes with modified independence.  Baseline: difficulty with being able to hand wash dishes at eval, difficulty stabilizing dish; 01/15/23: easier with dishes, harder with a heavy pan Goal status: IN PROGRESS/partially met  6.  Pt will demonstrate FOTO score of 59 or greater to show a clinically relevant change in LUE to impact greater independence with daily ADL and IADL tasks at home and in the community.   Baseline: Eval: 53; 12/11/22: 55; 01/15/23: 56 Goal status: IN PROGRESS  ASSESSMENT:  CLINICAL IMPRESSION: Pt reports he's not sure how much his L hand is improving.  OT reviewed progress towards goals with pt and pt was able to acknowledge some slow but steady gains.  Continued focus on L hand strengthening.  Pt reported that he is continuing to struggle with cupping his L hand to hold a palm full of pills.  Facilitated lumbrical grasp to mimic this movement with L forearm supinated, holding tennis ball within thumb and fingertips.  Practiced holding against a tug.  Not yet able yet able to manage this with a piece of paper without PIP flexion and unable to cup hand without active assist from the R hand.  Pt. continues to present with left hand intrinsic weakness, and requires verbal, and tactile cues for form and technique during the exercises.  Plan to continue to work towards goals in plan of care to improve left UE function for daily tasks.    PERFORMANCE DEFICITS: in functional skills including ADLs, IADLs, coordination, dexterity, sensation, tone, ROM, strength, flexibility, Fine motor control, balance, endurance, decreased knowledge of use of DME, and UE functional use, cognitive skills including, and psychosocial skills including environmental adaptation, habits, and routines and behaviors.   IMPAIRMENTS: are  limiting patient from ADLs, IADLs, and social participation.   CO-MORBIDITIES: has co-morbidities such as CAD, ESRD with dialysis, deafness in right ear, diabetic retinopathy, HTN, neuropathy  that affects occupational performance. Patient will benefit from skilled OT to address  above impairments and improve overall function.  MODIFICATION OR ASSISTANCE TO COMPLETE EVALUATION: Min-Moderate modification of tasks or assist with assess necessary to complete an evaluation.  OT OCCUPATIONAL PROFILE AND HISTORY: Comprehensive assessment: Review of records and extensive additional review of physical, cognitive, psychosocial history related to current functional performance.  CLINICAL DECISION MAKING: Moderate - several treatment options, min-mod task modification necessary  REHAB POTENTIAL: Good  EVALUATION COMPLEXITY: Moderate PLAN:  OT FREQUENCY: 2x/week  OT DURATION: 12 weeks  PLANNED INTERVENTIONS: self care/ADL training, therapeutic exercise, therapeutic activity, neuromuscular re-education, manual therapy, balance training, paraffin, moist heat, contrast bath, patient/family education, and DME and/or AE instructions  RECOMMENDED OTHER SERVICES: Pt is being seen by PT currently.   CONSULTED AND AGREED WITH PLAN OF CARE: Patient  PLAN FOR NEXT SESSION: grip, pinch and coordination skills.   Danelle Earthly, MS, OTR/L

## 2023-03-03 ENCOUNTER — Ambulatory Visit: Payer: Medicare Other | Admitting: Occupational Therapy

## 2023-03-03 ENCOUNTER — Ambulatory Visit: Payer: Medicare Other

## 2023-03-04 ENCOUNTER — Ambulatory Visit: Payer: Medicare Other

## 2023-03-04 NOTE — Therapy (Signed)
OUTPATIENT PHYSICAL THERAPY NEURO TREATMENT   Patient Name: Gregory Crane MRN: [id number] DOB:1971/08/16, 52 y.o., male Today's Date: 03/04/2023  PCP: Dale Keenes MD REFERRING PROVIDER: Dale Warrenton MD     Past Medical History:  Diagnosis Date   Allergy    Anemia    Bell's palsy    Diabetes mellitus without complication (HCC)    diet controlled   Hypertension    Hypothyroidism    Kidney stones    Pseudotumor cerebri    Stroke [facility])    Past Surgical History:  Procedure Laterality Date   COLONOSCOPY WITH PROPOFOL N/A 03/30/2020   Procedure: COLONOSCOPY WITH PROPOFOL;  Surgeon: Regis Bill, MD;  Location: [facility] ENDOSCOPY;  Service: Endoscopy;  Laterality: N/A;   LOOP RECORDER INSERTION N/A 01/27/2018   Procedure: LOOP RECORDER INSERTION;  Surgeon: Duke Salvia, MD;  Location: [facility] INVASIVE CV LAB;  Service: Cardiovascular;  Laterality: N/A;   LUMBAR PUNCTURE     as child   NO PAST SURGERIES     TEE WITHOUT CARDIOVERSION N/A 01/07/2018   Procedure: TRANSESOPHAGEAL ECHOCARDIOGRAM (TEE);  Surgeon: Antonieta Iba, MD;  Location: [facility] ORS;  Service: Cardiovascular;  Laterality: N/A;   Patient Active Problem List   Diagnosis Date Noted   Unsteady gait 10/05/2022   Steal syndrome of dialysis vascular access (HCC) 05/28/2022   Hydronephrosis, left 05/04/2022   Type II diabetes mellitus with renal manifestations (HCC) 05/04/2022   Leukocytosis 05/04/2022   Pre-op evaluation 04/24/2022   Chest pain 01/13/2022   Diabetic retinopathy associated with diabetes mellitus due to underlying condition (HCC) 01/13/2022   ESRD on hemodialysis (HCC) 01/13/2022   Deafness in right ear 08/11/2021   Headache 07/07/2021   Hearing loss 07/07/2021   Open wound 01/25/2021   History of colon polyps 10/15/2020   Postoperative hemorrhage involving digestive system following digestive system procedure 09/19/2020   Acute cholecystitis without calculus 09/09/2020   Type 2 diabetes  mellitus, with long-term current use of insulin (HCC) 09/09/2020   Cryptogenic stroke (HCC) 07/13/2020   History of loop recorder 07/13/2020   Weakness 06/18/2020   History of 2019 novel coronavirus disease (COVID-19) 05/20/2020   ESRD (end stage renal disease) (HCC) 04/04/2020   Pneumonia due to COVID-19 virus 04/03/2020   AKI (acute kidney injury) (HCC) 04/03/2020   Elevated troponin 04/03/2020   Acquired trigger finger 06/08/2019   Lymphedema 06/08/2019   Anemia 04/17/2019   Swelling of left lower extremity 01/10/2019   Facial droop 04/09/2018   Daytime somnolence 03/30/2018   Carotid artery disease (HCC) 02/03/2018   Intracranial vascular stenosis 09/12/2017   Cough 01/20/2017   Bell's palsy 11/10/2016   History of CVA (cerebrovascular accident) 11/10/2016   Benign localized hyperplasia of prostate with urinary obstruction 10/27/2016   History of nephrolithiasis 10/27/2016   TIA (transient ischemic attack) 10/20/2016   Near syncope 06/23/2016   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   Neuropathy 08/06/2015   Health care maintenance 08/06/2015   Essential hypertension 08/06/2015   Heme positive stool 10/10/2013   Hypothyroidism 10/10/2013   Microalbuminuria 10/10/2013   Hyperlipidemia 10/10/2013   B12 deficiency 10/10/2013   Diabetes (HCC) 07/04/2013   Environmental allergies 07/04/2013   ONSET DATE: 2-3 years  REFERRING DIAG: Neuropathy  THERAPY DIAG:  No diagnosis found.  Rationale for Evaluation and Treatment Rehabilitation  SUBJECTIVE:  SUBJECTIVE STATEMENT: ***   Pt accompanied by: self    PERTINENT HISTORY: Patient presents to physical therapy for neuropathy. He underwent banding of his L UE fistula on 05/13/22 and scheduled for additional banding on 06/07/22. 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 Patient reports his balance is very unsteady due to neuropathy in feet, feels weak in LE's.   PAIN:  Denies pain, but reports stiffness   PRECAUTIONS: Fall  WEIGHT BEARING RESTRICTIONS Yes no lifting >5 lb in arm   FALLS: Has patient fallen in last 6 months? No  PLOF: Independent  PATIENT GOALS to be more steady and walk as normally as possible.   OBJECTIVE:   TODAY'S TREATMENT:   Unless otherwise stated, CGA was provided and gait belt donned in order to ensure pt safety  TherEx: (23 min)  -psoas stretch 2 x 30 sec B  -soleus/gastroc stretch 2 x 30 sec B -20 x each: eversion / inversion without supination on 1/2 foam roller; used hands to stabilize femur to avoid hip IR/ER (added to HEP with pillow) -calf raises w/ 5# AW on B thighs for added resistance  -heel walks in // bars  -toe walks in // bars   NMRE: (15 min)  -lateral step overs in // bars with emphasis on foot placement to avoid supination  TherAct:  STS throughout session mod I   PATIENT EDUCATION: Education details: Pt educated throughout session about proper posture and technique with exercises. Improved exercise technique, movement at target joints, use of target muscles after min to mod verbal, visual, tactile cues.  Person educated: Patient Education method: Explanation, Demonstration, Tactile cues, and Verbal cues Education comprehension: verbalized understanding, returned demonstration, verbal cues required, and tactile cues required  HOME EXERCISE PROGRAM: Access Code: [email or web address] URL: [email or web address] Date: 02/19/2023 Prepared by: Precious Bard Exercises - Seated Heel Toe Raises  - 1 x daily - 7 x weekly - 2 sets - 10 reps - 5 hold - Standing Tandem Balance with Counter Support  -  1 x daily - 7 x weekly - 2 sets - 2 reps - 30 hold - Standing March with Counter Support  - 1 x daily - 7 x weekly - 2 sets - 10 reps - 5 hold - Ankle Inversion Eversion Towel Slide  - 1 x daily - 7 x weekly - 1 sets - 15 reps - 5 hold - Standing Hip Flexor Stretch  - 1 x daily - 7 x weekly - 2 sets - 1 reps - 60 hold   Access Code: [email or web address] URL: [email or web address] Date: 06/03/2022 Prepared by: Precious Bard  Exercises - Seated Heel Toe Raises  - 1 x daily - 7 x weekly - 2 sets - 10 reps - 5 hold - Standing Tandem Balance with Counter Support  - 1 x daily - 7 x weekly - 2 sets - 2 reps - 30 hold - Standing March with Counter Support  - 1 x daily - 7 x weekly - 2 sets - 10 reps - 5 hold   GOALS: Goals reviewed with patient? Yes   SHORT TERM GOALS: Target date: 07/01/2022  Patient will be independent in home exercise program to improve strength/mobility for better functional independence with ADLs. Baseline:10/9; HEP given 2/21: HEP compliant  Goal status: MET   LONG TERM GOALS: Target date:04/28/2023    Patient will increase  FOTO score to equal to or greater than   63%  to demonstrate statistically significant improvement in mobility and quality of life.  Baseline: 10/9: 53% 11/29: 58% 12/27: 51% 1/17: 56% 2/21: 53% 3/18: 60%  4/8 58%; 6/26: 60% Goal status: IN PROGRESS  2.  Patient (< 92 years old) will complete five times sit to stand test in < 10 seconds without UE support indicating an increased LE strength and improved balance. Baseline: 10/9: 19.6 seconds with walking stick; one LOB 11/29: 20.56 seconds 12/27: 13.4 seconds hands on knees 1/17: 13 seconds hands on knees  2/21: 14 seconds no hands 3/18: 12 seconds no hands. 4/8 11.3 sec UE on knees. 5/13: 10.94 sec pushing from thighs. 6/24: 10.08 sec, pushing from thighs  Goal status: IN PROGRESS  3.  Patient will increase Berg Balance score by > 45/56 to demonstrate decreased fall risk during functional  activities. Baseline: 10/9: 33/56 11/29: 81/19 14/78: 29/56 2/13:  08/65 2/21: 46/56 3/18: 48/56 Goal status: MET  +4.  Patient will increase 10 meter walk test to >1.23m/s as to improve gait speed for better community ambulation and to reduce fall risk. Baseline: 11/29: 1.19 m/s Goal status: MET  5.  Patient will increase ABC scale score >80% to demonstrate better functional mobility and better confidence with ADLs.  Baseline: 10/9: 67% 11/29: 75.6% 12/27: 78%  2/21: 78% 3/18: 80% Goal status: MET  6. Patient will tolerate 5 seconds of single leg stance without loss of balance to improve ability to get in and out of shower safely. Baseline: 11/29: unable to stand on single leg safely 12/27: 2 seconds 1/17: 3 seconds 2/21: 3 seconds 3/18: 4 seconds. 4/8: 3 sec  5/13: LLE x 3 sec and RLE x 4 sec; 6/26:  Goal status: IN PROGRESS   6. Patient will increase six minute walk test distance to >1000 for progression to community ambulator and improve gait ability Baseline: 12/27: 845 ft with with walking stick 1/17: 743ft 2/21: 930 ft  3/18: 950 ft 4/8: 1170ft. 5/13: 1185 Goal status: met  7. Patient will increase dynamic gait index score to >19/24 as to demonstrate reduced fall risk and improved dynamic gait balance for better safety with community/home ambulation.  Baseline: 3/18:  9/24. 4/8: 16/24 5/13: 18/24 Goal status: in Progress  8. Patient will increase six minute walk test distance to >1500 for progression to community ambulator and improve gait ability Baseline: . 5/13: 1185; 6/26: 1145 with good gait mechanics  Goal status: IN PROGRESS    ASSESSMENT: CLINICAL IMPRESSION: *** Pt presented to skilled PT reporting he had an incident of swelling over the weekend, which has mostly subsided. Activities progress today with outdoor ambulation for uneven surfaces, which continues to be a challenge to pt. Therex also emphasized calf strength with ankle stabilization, which will be progressed  next session. The pt will continue to benefit from skilled PT to make progress in BLE strength, balance, and coordination to return to PLOF and improve QoL.    OBJECTIVE IMPAIRMENTS Abnormal gait, decreased activity tolerance, decreased balance, decreased coordination, decreased endurance, decreased mobility, difficulty walking, decreased strength, impaired flexibility, impaired sensation, improper body mechanics, and pain.   ACTIVITY LIMITATIONS carrying, lifting, bending, standing, squatting, sleeping, stairs, transfers, bed mobility, bathing, toileting, dressing, locomotion level, and caring for others  PARTICIPATION LIMITATIONS: meal prep, cleaning, laundry, medication management, personal finances, interpersonal relationship, driving, shopping, community activity, and yard work  PERSONAL FACTORS Age, Fitness, Past/current experiences, Time since onset of  injury/illness/exacerbation, Transportation, and 3+ comorbidities: CAD, cryptogenic stroke (2021), deafness in R ear, diabetic retinopathy, dialysis, ESRD, COVID, HTN, hypothyroidism, leukocytosis, nephrolithiasis, neuropathy, Pseudotumor cerebri 1986, sepsis, DM type I, DM type II  are also affecting patient's functional outcome.   REHAB POTENTIAL: Good  CLINICAL DECISION MAKING: Evolving/moderate complexity  EVALUATION COMPLEXITY: Moderate  PLAN: PT FREQUENCY: 2x/week  PT DURATION: 12 weeks  PLANNED INTERVENTIONS: Therapeutic exercises, Therapeutic activity, Neuromuscular re-education, Balance training, Gait training, Patient/Family education, Self Care, Joint mobilization, Stair training, Vestibular training, Canalith repositioning, Visual/preceptual remediation/compensation, DME instructions, Dry Needling, Cognitive remediation, Spinal mobilization, Cryotherapy, Moist heat, Manual lymph drainage, Compression bandaging, Taping, Vasopneumatic device, Ultrasound, Manual therapy, and Re-evaluation  PLAN FOR NEXT SESSION:   Follow up  re: ankle stab.; calf strength, uneven surfaces  Judith Blonder, SPT   This entire session was performed under direct supervision and direction of a licensed Estate agent . I have personally read, edited and approve of the note as written.  Precious Bard PT ,DPT Physical Therapist- [place]  [facility]

## 2023-03-05 ENCOUNTER — Ambulatory Visit: Payer: Medicare Other | Admitting: Occupational Therapy

## 2023-03-05 ENCOUNTER — Ambulatory Visit: Payer: Medicare Other

## 2023-03-05 ENCOUNTER — Encounter: Payer: Self-pay | Admitting: Occupational Therapy

## 2023-03-05 DIAGNOSIS — R2681 Unsteadiness on feet: Secondary | ICD-10-CM

## 2023-03-05 DIAGNOSIS — M6281 Muscle weakness (generalized): Secondary | ICD-10-CM

## 2023-03-05 DIAGNOSIS — R269 Unspecified abnormalities of gait and mobility: Secondary | ICD-10-CM

## 2023-03-05 DIAGNOSIS — R278 Other lack of coordination: Secondary | ICD-10-CM

## 2023-03-05 NOTE — Therapy (Signed)
OCCUPATIONAL THERAPY NEURO TREATMENT NOTE/PROGRESS UPDATE REPORTING PERIOD FROM 01/15/2023 TO 03/05/2023  Patient Name: Gregory Crane MRN: [id number] DOB:May 11, 1971, 52 y.o., male  PCP: Dale [place] REFERRING PROVIDER: Dale [employer]  END OF SESSION:  OT End of Session - 03/07/23 0927     Visit Number 30    Number of Visits 44    Date for OT Re-Evaluation 04/09/23    Progress Note Due on Visit 10    OT Start Time 1103    OT Stop Time 1145    OT Time Calculation (min) 42 min    Activity Tolerance Patient tolerated treatment well    Behavior During Therapy WFL for tasks assessed/performed                   Past Medical History:  Diagnosis Date   Allergy    Anemia    Bell's palsy    Diabetes mellitus without complication (HCC)    diet controlled   Hypertension    Hypothyroidism    Kidney stones    Pseudotumor cerebri    Stroke [facility])    Past Surgical History:  Procedure Laterality Date   COLONOSCOPY WITH PROPOFOL N/A 03/30/2020   Procedure: COLONOSCOPY WITH PROPOFOL;  Surgeon: Regis Bill, MD;  Location: [facility] ENDOSCOPY;  Service: Endoscopy;  Laterality: N/A;   LOOP RECORDER INSERTION N/A 01/27/2018   Procedure: LOOP RECORDER INSERTION;  Surgeon: Duke Salvia, MD;  Location: [facility] INVASIVE CV LAB;  Service: Cardiovascular;  Laterality: N/A;   LUMBAR PUNCTURE     as child   NO PAST SURGERIES     TEE WITHOUT CARDIOVERSION N/A 01/07/2018   Procedure: TRANSESOPHAGEAL ECHOCARDIOGRAM (TEE);  Surgeon: Antonieta Iba, MD;  Location: [facility] ORS;  Service: Cardiovascular;  Laterality: N/A;   Patient Active Problem List   Diagnosis Date Noted   Unsteady gait 10/05/2022   Steal syndrome of dialysis vascular access (HCC) 05/28/2022   Hydronephrosis, left 05/04/2022   Type II diabetes mellitus with renal manifestations (HCC) 05/04/2022   Leukocytosis 05/04/2022   Pre-op evaluation 04/24/2022   Chest pain 01/13/2022   Diabetic retinopathy associated with  diabetes mellitus due to underlying condition (HCC) 01/13/2022   ESRD on hemodialysis (HCC) 01/13/2022   Deafness in right ear 08/11/2021   Headache 07/07/2021   Hearing loss 07/07/2021   Open wound 01/25/2021   History of colon polyps 10/15/2020   Postoperative hemorrhage involving digestive system following digestive system procedure 09/19/2020   Acute cholecystitis without calculus 09/09/2020   Type 2 diabetes mellitus, with long-term current use of insulin (HCC) 09/09/2020   Cryptogenic stroke (HCC) 07/13/2020   History of loop recorder 07/13/2020   Weakness 06/18/2020   History of 2019 novel coronavirus disease (COVID-19) 05/20/2020   ESRD (end stage renal disease) (HCC) 04/04/2020   Pneumonia due to COVID-19 virus 04/03/2020   AKI (acute kidney injury) (HCC) 04/03/2020   Elevated troponin 04/03/2020   Acquired trigger finger 06/08/2019   Lymphedema 06/08/2019   Anemia 04/17/2019   Swelling of left lower extremity 01/10/2019   Facial droop 04/09/2018   Daytime somnolence 03/30/2018   Carotid artery disease (HCC) 02/03/2018   Intracranial vascular stenosis 09/12/2017   Cough 01/20/2017   Bell's palsy 11/10/2016   History of CVA (cerebrovascular accident) 11/10/2016   Benign localized hyperplasia of prostate with urinary obstruction 10/27/2016   History of nephrolithiasis 10/27/2016   TIA (transient ischemic attack) 10/20/2016   Near syncope 06/23/2016   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   Neuropathy 08/06/2015  Health care maintenance 08/06/2015   Essential hypertension 08/06/2015   Heme positive stool 10/10/2013   Hypothyroidism 10/10/2013   Microalbuminuria 10/10/2013   Hyperlipidemia 10/10/2013   B12 deficiency 10/10/2013   Diabetes (HCC) 07/04/2013   Environmental allergies 07/04/2013   ONSET DATE: May 2023  REFERRING DIAG: CVA  THERAPY DIAG:  Muscle weakness (generalized)  Other lack of coordination  Unsteadiness on feet  Rationale for Evaluation and  Treatment: Rehabilitation  SUBJECTIVE:  SUBJECTIVE STATEMENT:  Pt reports next week he is going to go to [place] for a vacation and will miss therapy.  He will come to [facility] session.  He is able to get his dialysis at a remote location there.    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    PRECAUTIONS: Fall and Other: No strengthening exercises greater than 5-10 pounds in left arm.   Dialysis Tues, Th, Sat.  WEIGHT BEARING RESTRICTIONS: No  PAIN:  Are you having pain? No  FALLS: Has patient fallen in last 6 months? No  LIVING ENVIRONMENT: Lives with: alone Lives in: House/apartment Stairs: Yes: Internal: 15 steps; on right going up Has following equipment at home: Single point cane and Walker - 2 wheeled  PLOF: Independent  PATIENT GOALS: Pt reports he wants to have better use of his left hand, be able to do buttons and shoes.   OBJECTIVE:   HAND DOMINANCE: Right  ADLs: Overall ADLs: modified independent with most tasks Transfers/ambulation related to ADLs: Now walking without an assistive device, used cane in the past.  Eating: Independent with self feeding, difficulty with cutting food, requires increased focus and effort. Grooming: Independent UB Dressing: Difficulty with buttons, uses pullovers most of the time  LB Dressing: Difficulty with tying shoes Toileting: Independent Bathing: Independent Tub Shower transfers: Independent Equipment: none  IADLs: Shopping: independent,  uses a cart to push in the store Light housekeeping: modified independent Meal Prep: difficulty  with tasks requiring use of left hand, bilateral hand use.   Community mobility: Ambulates without use of assistive device, able to drive Medication management: Independent Financial management: Independent Handwriting: 90% legible  MOBILITY STATUS: Needs Assist: Pt has progressed with PT, used to use a cane but ambulating without an assistive device but still has balance difficulties.    POSTURE COMMENTS:    Sitting balance: Sits without UE support up to 30 sec  ACTIVITY TOLERANCE: Activity tolerance: limited activity tolerance  FUNCTIONAL OUTCOME MEASURES: FOTO: 53 12/11/22: FOTO: 55 01/15/23: 56  UPPER EXTREMITY ROM:    Active ROM Right eval Left eval  Shoulder flexion [facility]  Shoulder abduction [facility]  Shoulder adduction [facility]  Shoulder extension [facility]  Shoulder internal rotation [facility]  Shoulder external rotation [facility]  Elbow flexion [facility] WFL  Elbow extension [facility]  Wrist flexion [facility] WFL  Wrist extension [facility] WFL  Wrist ulnar deviation [facility] WFL  Wrist radial deviation [facility] WFL  Wrist pronation [facility] WFL  Wrist supination WFL WFL  (Blank rows = not tested)  UPPER EXTREMITY MMT:     MMT Right eval Left eval Left 01/15/23  Shoulder flexion 5/5 3+/5 4+  Shoulder abduction 5/5 3+/5 4+  Shoulder adduction     Shoulder extension     Shoulder internal rotation     Shoulder external rotation     Middle trapezius     Lower trapezius     Elbow flexion 5/5 3+/5 5  Elbow extension 5/5 3+/5 4+  Wrist flexion 5/5 3+/5 4+  Wrist extension 5/5 3+/5 4+  Wrist ulnar deviation 5/5 3+/5 4+  Wrist radial deviation 5/5 3+/5 4+  Wrist pronation 5/5 3+/5 4+  Wrist supination 5/5 3+/5 4+  (Blank rows = not tested)  Opposition of thumb to all digits on right, on left hand opposition to ring finger but cannot demonstrate to small finger.   HAND FUNCTION: Grip strength: Right: 44 lbs; Left: 35 lbs, Lateral pinch: Right: 17 lbs, Left: 16 lbs, 3 point pinch: Right: 14 lbs,  Left: 0 lbs, and Tip pinch: Right 2 lbs, Left: 0 lbs 12/23/2022: Grip strength: Right: 50 lbs; Left: 43 lbs, Lateral pinch: Right: 10 lbs, Left: 8 lbs, 3 point pinch: Right:  9 lbs, Left: 8 lbs, and Tip pinch: Right 8 lbs, Left: 4 lbs 01/15/23: R grip 45 lbs, Left: 34 lbs; R lateral pinch 10 lbs, L 7 lbs; 3 point pinch R: 7 lbs, L 7 lbs; tip pinch R: 8 lbs, L 4 lbs (At dialysis yesterday, pt reports they pulled a lot of fluid and pt was so tired that he almost got sick) 02/17/2023: Grip strength: Right: 50 lbs; Left: 45  lbs, Lateral pinch: Right: 10  lbs, Left: 17 lbs, 3 point pinch: Right: 9  lbs, Left: 10 lbs, and Tip pinch: Right  8 lbs, Left: 5 lbs 03/05/2023: Grip strength: Right: 50 lbs; Left: 41  lbs, Lateral pinch: Right: 10 lbs, Left: 10 lbs, 3 point pinch: Right: 9  lbs, Left: 7 lbs, and Tip pinch: Right  8 lbs, Left: 7 lbs  COORDINATION: 9 Hole Peg test: Right: 36 sec; Left: 56 sec 12/23/2022: 9 Hole Peg test: Right: 32 sec; Left: 45 sec 01/15/23: R hand 26 sec, Left: 43 sec 02/17/23: R hand 39 sec, Left: 53 sec 03/05/23: R hand 36 sec, Left: 50 sec  TODAY'S TREATMENT: 03/05/2023    Therapeutic Exercise: Review of current exercises, he is continuing with the stronger resistive putty with grip and pinch strength and also working on resisted finger extension on the left hand for ring and small fingers.  Since he has been focusing on this at home daily, he demonstrates increased consistency with opening and closing of hand, decreased flexion of small finger and decreased need to manually position finger over objects he is attempting to hold.    FOTO reassessment score of 60  Pt seen this date for reassessment of hand function, strength and update on goals, see measurements listed above in bold and dated today's date.  ADL: Pt has difficulty with left hand cupping of hand to pour pills in his hand without dropping out of the ulnar side of the hand.  He has improved with tying shoes but it  still takes him increased time to complete. He usually bends over to tie shoes from a seated position.  He has difficulty with graded pressure of the left hand and reports the other day he went through the drive thru at [facility] and when the person handed his drink to him, he crushed the cup and it spilt everywhere.  He has improved with being able to manage the caps and open a water  bottle.  He is able to demonstrate dropping things less frequently.  He demonstrates understanding of using dycem under items to help stabilize as needed and for opening containers.      PATIENT EDUCATION: Education details: intrinsic strengthening for L hand Person educated: Patient Education method: Explanation, demo, written handout Education comprehension: verbalized understanding, min vc, tactile cues  HOME EXERCISE PROGRAM: Theraputty, tendon glides, intrinsic strengthening with digit abd/add and lumbrical isometric strengthening.  GOALS: Goals reviewed with patient? Yes  SHORT TERM GOALS: Target date:  02/26/2023   Pt will demonstrate ability to hold and utilize utensils for cutting meat with modified independence. Baseline: difficulty with cutting, stabilizing item with fork for cutting; 01/15/23: pt reports he can cut meat, but it's awkward Goal status: MET  2.  Pt will demonstrate ability to obtain keys from left pocket without difficult and without dropping.   Baseline: pt struggles with reaching into left pocket to get keys; 5/22: still challenging but can do it without dropping Goal status: MET   LONG TERM GOALS: Target date: 04/09/2023   Pt will demonstrate HEP with modified independence.   Baseline: no current program for left UE. (Has green tband but needs educ on exercises); 01/15/23: using theraputty and has recently begun to focus on L hand intrinsic strengthening; 03/05/23 continue to update HEP as he progresses Goal status: IN PROGRESS  2.  Pt will improve left UE coordination by 10  secs on 9 hole peg test to demonstrate shoe tying with modified independence.  Baseline: currently wearing slip on shoes and unable to tie shoes, left 9 hole peg test in 56 sec at eval; 01/15/23: pt reports he can tie shoes, sometimes requires multiple attempts, but can do it.  Often chooses to wear slip ons as this is still challenging; L 9 hole 43 sec Goal status: IN PROGRESS/ partially met  3.  Pt will demonstrate improved coordination in left hand to demonstrate buttoning buttons on clothing with modified independence.  Baseline: difficulty with buttons and has been wearing more pullovers to avoid challenge of buttons; 01/15/23: pt reports still challenging Goal status: MET  4.  Pt will demonstrate improvement in left grip strength by 5# to open jars and containers with modified independence and greater ease.   Baseline: Difficulty with opening jars and containers with use of left hand at eval; 01/15/23: pt reports easier time opening a water bottle top without dicem, but uses dycem to open jars (increased L hand weakness as compared to last progress update, but pt reports increased weakness this week from having excess fluid pulled off him during dialysis and would like to re-measure on a Monday after pt has had a couple days off of dialysis). Goal status: IN PROGRESS  5.  Pt will improve left UE strength by 1 mm grade to hold and hand wash dishes with modified independence.  Baseline: difficulty with being able to hand wash dishes at eval, difficulty stabilizing dish; 01/15/23: easier with dishes, harder with a heavy pan Goal status: MET  6.  Pt will demonstrate FOTO score of 59 or greater to show a clinically relevant change in LUE to impact greater independence with daily ADL and IADL tasks at home and in the community.   Baseline: Eval: 53; 12/11/22: 55; 01/15/23: 56 03/05/23: score of 60 Goal status: MET  ASSESSMENT:  CLINICAL IMPRESSION: Pt demonstrating improvements in left hand strength,  coordination and hand function as noted by improved measurements and improved function during daily tasks.  He  is dropping items less frequently and able to pick up and manipulate items with greater ease.  He still requires increased time to tie his shoes.  He demonstrates decreased hand intrinsic strength and decreased cupping of palm to hold items.  He also demonstrates difficulty with graded pressure of left hand with holding objects, he recently crushed a cup from a drive thru when handed the cup and spilling everywhere. Pt. continues to present with left hand intrinsic weakness, and requires verbal, and tactile cues for form and technique during the exercises.  Pt continues to benefit from skilled OT services to work towards goals in plan of care to improve left UE function for daily tasks.    PERFORMANCE DEFICITS: in functional skills including ADLs, IADLs, coordination, dexterity, sensation, tone, ROM, strength, flexibility, Fine motor control, balance, endurance, decreased knowledge of use of DME, and UE functional use, cognitive skills including, and psychosocial skills including environmental adaptation, habits, and routines and behaviors.   IMPAIRMENTS: are limiting patient from ADLs, IADLs, and social participation.   CO-MORBIDITIES: has co-morbidities such as CAD, ESRD with dialysis, deafness in right ear, diabetic retinopathy, HTN, neuropathy  that affects occupational performance. Patient will benefit from skilled OT to address above impairments and improve overall function.  MODIFICATION OR ASSISTANCE TO COMPLETE EVALUATION: Min-Moderate modification of tasks or assist with assess necessary to complete an evaluation.  OT OCCUPATIONAL PROFILE AND HISTORY: Comprehensive assessment: Review of records and extensive additional review of physical, cognitive, psychosocial history related to current functional performance.  CLINICAL DECISION MAKING: Moderate - several treatment options, min-mod  task modification necessary  REHAB POTENTIAL: Good  EVALUATION COMPLEXITY: Moderate PLAN:  OT FREQUENCY: 2x/week  OT DURATION: 12 weeks  PLANNED INTERVENTIONS: self care/ADL training, therapeutic exercise, therapeutic activity, neuromuscular re-education, manual therapy, balance training, paraffin, moist heat, contrast bath, patient/family education, and DME and/or AE instructions  RECOMMENDED OTHER SERVICES: Pt is being seen by PT currently.   CONSULTED AND AGREED WITH PLAN OF CARE: Patient  PLAN FOR NEXT SESSION: grip, pinch and coordination skills.   Jayci Ellefson Cornelius Moras, OTR/L, CLT  Malene Blaydes, OT 03/07/2023, 9:53 AM

## 2023-03-06 ENCOUNTER — Ambulatory Visit: Payer: Medicare Other

## 2023-03-06 LAB — HM DIABETES EYE EXAM

## 2023-03-06 NOTE — Therapy (Signed)
OUTPATIENT PHYSICAL THERAPY NEURO TREATMENT   Patient Name: Gregory Crane MRN: [id number] DOB:[birth year], 52 y.o., male Today's Date: 03/10/2023  PCP: Dale Meadowbrook MD REFERRING PROVIDER: Dale Calio MD  PT End of Session - 03/10/23 0929     Visit Number 64    Number of Visits 80    Date for PT Re-Evaluation 04/28/23    Authorization Type Medicare    Authorization Time Period 11/11/22-02/03/23    Authorization - Number of Visits 12    Progress Note Due on Visit 60    PT Start Time 0930    PT Stop Time 1012    PT Time Calculation (min) 42 min    Equipment Utilized During Treatment Gait belt    Activity Tolerance Patient tolerated treatment well;No increased pain    Behavior During Therapy WFL for tasks assessed/performed                Past Medical History:  Diagnosis Date   Allergy    Anemia    Bell's palsy    Diabetes mellitus without complication (HCC)    diet controlled   Hypertension    Hypothyroidism    Kidney stones    Pseudotumor cerebri    Stroke [facility])    Past Surgical History:  Procedure Laterality Date   COLONOSCOPY WITH PROPOFOL N/A 03/30/2020   Procedure: COLONOSCOPY WITH PROPOFOL;  Surgeon: Regis Bill, MD;  Location: [facility] ENDOSCOPY;  Service: Endoscopy;  Laterality: N/A;   LOOP RECORDER INSERTION N/A 01/27/2018   Procedure: LOOP RECORDER INSERTION;  Surgeon: Duke Salvia, MD;  Location: [facility] INVASIVE CV LAB;  Service: Cardiovascular;  Laterality: N/A;   LUMBAR PUNCTURE     as child   NO PAST SURGERIES     TEE WITHOUT CARDIOVERSION N/A 01/07/2018   Procedure: TRANSESOPHAGEAL ECHOCARDIOGRAM (TEE);  Surgeon: Antonieta Iba, MD;  Location: [facility] ORS;  Service: Cardiovascular;  Laterality: N/A;   Patient Active Problem List   Diagnosis Date Noted   Unsteady gait 10/05/2022   Steal syndrome of dialysis vascular access (HCC) 05/28/2022   Hydronephrosis, left 05/04/2022   Type II diabetes mellitus with renal manifestations (HCC)  05/04/2022   Leukocytosis 05/04/2022   Pre-op evaluation 04/24/2022   Chest pain 01/13/2022   Diabetic retinopathy associated with diabetes mellitus due to underlying condition (HCC) 01/13/2022   ESRD on hemodialysis (HCC) 01/13/2022   Deafness in right ear 08/11/2021   Headache 07/07/2021   Hearing loss 07/07/2021   Open wound 01/25/2021   History of colon polyps 10/15/2020   Postoperative hemorrhage involving digestive system following digestive system procedure 09/19/2020   Acute cholecystitis without calculus 09/09/2020   Type 2 diabetes mellitus, with long-term current use of insulin (HCC) 09/09/2020   Cryptogenic stroke (HCC) 07/13/2020   History of loop recorder 07/13/2020   Weakness 06/18/2020   History of 2019 novel coronavirus disease (COVID-19) 05/20/2020   ESRD (end stage renal disease) (HCC) 04/04/2020   Pneumonia due to COVID-19 virus 04/03/2020   AKI (acute kidney injury) (HCC) 04/03/2020   Elevated troponin 04/03/2020   Acquired trigger finger 06/08/2019   Lymphedema 06/08/2019   Anemia 04/17/2019   Swelling of left lower extremity 01/10/2019   Facial droop 04/09/2018   Daytime somnolence 03/30/2018   Carotid artery disease (HCC) 02/03/2018   Intracranial vascular stenosis 09/12/2017   Cough 01/20/2017   Bell's palsy 11/10/2016   History of CVA (cerebrovascular accident) 11/10/2016   Benign localized hyperplasia of prostate with urinary obstruction 10/27/2016  History of nephrolithiasis 10/27/2016   TIA (transient ischemic attack) 10/20/2016   Near syncope 06/23/2016   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   Neuropathy 08/06/2015   Health care maintenance 08/06/2015   Essential hypertension 08/06/2015   Heme positive stool 10/10/2013   Hypothyroidism 10/10/2013   Microalbuminuria 10/10/2013   Hyperlipidemia 10/10/2013   B12 deficiency 10/10/2013   Diabetes (HCC) 07/04/2013   Environmental allergies 07/04/2013   ONSET DATE: 2-3 years  REFERRING DIAG:  Neuropathy  THERAPY DIAG:  Muscle weakness (generalized)  Unsteadiness on feet  Abnormality of gait and mobility  Difficulty in walking, not elsewhere classified  Rationale for Evaluation and Treatment Rehabilitation  SUBJECTIVE:                                                                                                                                                                                             SUBJECTIVE STATEMENT: Pt reports when he wakes up every morning his B calves feel tight, pt states he did his best to stretch out before PT. Pt will be leaving town the 17th for one week to go to [place] to be with his friend.    Pt accompanied by: self    PERTINENT HISTORY: Patient presents to physical therapy for neuropathy. He underwent banding of his L UE fistula on 05/13/22 and scheduled for additional banding on 06/07/22. 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 Patient reports his balance is very unsteady due to neuropathy in feet, feels weak in LE's.   PAIN:  Denies pain, but reports stiffness   PRECAUTIONS: Fall  WEIGHT BEARING RESTRICTIONS Yes no lifting >5 lb in arm   FALLS: Has patient fallen in last 6 months? No  PLOF: Independent  PATIENT GOALS to be more steady and walk as normally as possible.   OBJECTIVE:   TODAY'S TREATMENT:   Unless otherwise stated, CGA was provided and gait belt donned in order to ensure pt safety  TherEx: (30 min) seated rest break provided throughout  -seated EOB with hip flexion w/ abduction / adduction (arc motion); pt reports feeling it in his deep core and hip abductors L > R. Pt sititng upright to also target postural mm, pt required use of BUE behind trunk for support as he fatigued    -seated SL PF presses into pilates ball to  simulate releasing of parking break; SPT sitting on floor with ball in lap to elevate ball to match pt's parking break in car; second trial with with ball btwn  SPT's legs around 1.5" elevated from floor for same motion. Pt reported difficult.   -1 x 10 x 3 sec: glute bridges  -2 x 5 B:bridge position with CL heel slides with towel; pt reports L bridge leg is harder than R by ~30-40% (during stabilization) -2 x 10 x3 sec B clamshells with RTB   Manual: B (8 min) 60 sec each LE: -SL hamstring stretch  -SKC -piriformis  -glute stretch, L much less ROM than R   TherAct:  STS throughout session mod I   PATIENT EDUCATION: Education details: Pt educated throughout session about proper posture and technique with exercises. Improved exercise technique, movement at target joints, use of target muscles after min to mod verbal, visual, tactile cues.  Person educated: Patient Education method: Explanation, Demonstration, Tactile cues, and Verbal cues Education comprehension: verbalized understanding, returned demonstration, verbal cues required, and tactile cues required  HOME EXERCISE PROGRAM: Access Code: [email or web address] URL: [email or web address] Date: 03/10/2023 Prepared by: Precious Bard Exercises - Seated Heel Toe Raises  - 1 x daily - 7 x weekly - 2 sets - 10 reps - 5 hold - Standing Tandem Balance with Counter Support  - 1 x daily - 7 x weekly - 2 sets - 2 reps - 30 hold - Standing March with Counter Support  - 1 x daily - 7 x weekly - 2 sets - 10 reps - 5 hold - Ankle Inversion Eversion Towel Slide  - 1 x daily - 7 x weekly - 1 sets - 15 reps - 5 hold - Standing Hip Flexor Stretch  - 1 x daily - 7 x weekly - 2 sets - 1 reps - 60 hold - Ankle Rotations in Sitting  - 1 x daily - 7 x weekly - 1 sets - 20 reps - 5 hold - Supine Piriformis Stretch with Leg Straight  - 1 x daily - 7 x weekly - 2 sets - 2 reps - 60 hold   GOALS: Goals reviewed with patient? Yes   SHORT TERM GOALS: Target  date: 07/01/2022  Patient will be independent in home exercise program to improve strength/mobility for better functional independence with ADLs. Baseline:10/9; HEP given 2/21: HEP compliant  Goal status: MET   LONG TERM GOALS: Target date:04/28/2023    Patient will increase FOTO score to equal to or greater than   63%  to demonstrate statistically significant improvement in mobility and quality of life.  Baseline: 10/9: 53% 11/29: 58% 12/27: 51% 1/17: 56% 2/21: 53% 3/18: 60%  4/8 58%; 6/26: 60% Goal status: IN PROGRESS  2.  Patient (< 60 years old) will complete five times sit to stand test in < 10 seconds without UE support indicating an increased LE strength and improved balance. Baseline: 10/9: 19.6 seconds with walking stick; one LOB 11/29: 20.56 seconds 12/27: 13.4 seconds hands on knees 1/17: 13 seconds hands on knees  2/21: 14 seconds no hands 3/18: 12 seconds no hands. 4/8 11.3 sec UE on knees. 5/13: 10.94 sec pushing from thighs. 6/24: 10.08 sec, pushing from thighs  Goal status: IN PROGRESS  3.  Patient will increase Berg Balance score by > 45/56 to demonstrate decreased fall risk during functional activities. Baseline: 10/9: 33/56 11/29: 16/10 96/04: 54/09 8/11:  91/47 2/21: 46/56 3/18: 48/56 Goal status: MET  +4.  Patient will increase 10 meter walk test to >1.94m/s as to improve gait speed for better community ambulation and to reduce fall risk. Baseline: 11/29: 1.19 m/s Goal status: MET  5.  Patient will increase ABC scale score >80% to demonstrate better functional mobility and better confidence with ADLs.  Baseline: 10/9: 67% 11/29: 75.6% 12/27: 78%  2/21: 78% 3/18: 80% Goal status: MET  6. Patient will tolerate 5 seconds of single leg stance without loss of balance to improve ability to get in and out of shower safely. Baseline: 11/29: unable to stand on single leg safely 12/27: 2 seconds 1/17: 3 seconds 2/21: 3 seconds 3/18: 4 seconds. 4/8: 3 sec  5/13: LLE x 3 sec and  RLE x 4 sec; 6/26:  Goal status: IN PROGRESS   6. Patient will increase six minute walk test distance to >1000 for progression to community ambulator and improve gait ability Baseline: 12/27: 845 ft with with walking stick 1/17: 769ft 2/21: 930 ft  3/18: 950 ft 4/8: 1156ft. 5/13: 1185 Goal status: met  7. Patient will increase dynamic gait index score to >19/24 as to demonstrate reduced fall risk and improved dynamic gait balance for better safety with community/home ambulation.  Baseline: 3/18:  9/24. 4/8: 16/24 5/13: 18/24 Goal status: in Progress  8. Patient will increase six minute walk test distance to >1500 for progression to community ambulator and improve gait ability Baseline: . 5/13: 1185; 6/26: 1145 with good gait mechanics  Goal status: IN PROGRESS    ASSESSMENT: CLINICAL IMPRESSION: Pt presents to skilled PT motivated to participate in therapy. Interventions targeted hip mobility and strengthening to target pt reported difficulty with releasing parking break in car. The pt will continue to benefit from skilled PT to make progress in BLE strength, balance, and coordination to return to PLOF and improve QoL.    OBJECTIVE IMPAIRMENTS Abnormal gait, decreased activity tolerance, decreased balance, decreased coordination, decreased endurance, decreased mobility, difficulty walking, decreased strength, impaired flexibility, impaired sensation, improper body mechanics, and pain.   ACTIVITY LIMITATIONS carrying, lifting, bending, standing, squatting, sleeping, stairs, transfers, bed mobility, bathing, toileting, dressing, locomotion level, and caring for others  PARTICIPATION LIMITATIONS: meal prep, cleaning, laundry, medication management, personal finances, interpersonal relationship, driving, shopping, community activity, and yard work  PERSONAL FACTORS Age, Fitness, Past/current experiences, Time since onset of injury/illness/exacerbation, Transportation, and 3+ comorbidities:  CAD, cryptogenic stroke (2021), deafness in R ear, diabetic retinopathy, dialysis, ESRD, COVID, HTN, hypothyroidism, leukocytosis, nephrolithiasis, neuropathy, Pseudotumor cerebri 1986, sepsis, DM type I, DM type II  are also affecting patient's functional outcome.   REHAB POTENTIAL: Good  CLINICAL DECISION MAKING: Evolving/moderate complexity  EVALUATION COMPLEXITY: Moderate  PLAN: PT FREQUENCY: 2x/week  PT DURATION: 12 weeks  PLANNED INTERVENTIONS: Therapeutic exercises, Therapeutic activity, Neuromuscular re-education, Balance training, Gait training, Patient/Family education, Self Care, Joint mobilization, Stair training, Vestibular training, Canalith repositioning, Visual/preceptual remediation/compensation, DME instructions, Dry Needling, Cognitive remediation, Spinal mobilization, Cryotherapy, Moist heat, Manual lymph drainage, Compression bandaging, Taping, Vasopneumatic device, Ultrasound, Manual therapy, and Re-evaluation  PLAN FOR NEXT SESSION:   Follow up re: ankle stab.; calf strength, uneven surfaces; pt reports feeling weakness in hips lately, revisit flexor and glute strengthening   Judith Blonder, SPT   This entire session was performed under direct supervision and direction of a licensed Estate agent . I have personally read, edited and approve of the note as written.  Precious Bard PT ,DPT Physical Therapist- [place]  [facility]

## 2023-03-10 ENCOUNTER — Ambulatory Visit: Payer: Medicare Other

## 2023-03-10 ENCOUNTER — Ambulatory Visit: Payer: Medicare Other | Admitting: Occupational Therapy

## 2023-03-10 DIAGNOSIS — M6281 Muscle weakness (generalized): Secondary | ICD-10-CM | POA: Diagnosis not present

## 2023-03-10 DIAGNOSIS — R278 Other lack of coordination: Secondary | ICD-10-CM

## 2023-03-10 DIAGNOSIS — R269 Unspecified abnormalities of gait and mobility: Secondary | ICD-10-CM

## 2023-03-10 DIAGNOSIS — R2681 Unsteadiness on feet: Secondary | ICD-10-CM

## 2023-03-10 DIAGNOSIS — R262 Difficulty in walking, not elsewhere classified: Secondary | ICD-10-CM

## 2023-03-10 NOTE — Therapy (Addendum)
OCCUPATIONAL THERAPY NEURO TREATMENT NOTE  Patient Name: Gregory Crane MRN: [id number] DOB:10-Jun-1971, 52 y.o., male  PCP: [place] REFERRING PROVIDER: Dale Clyde  END OF SESSION:  OT End of Session - 03/10/23 1406     Visit Number 31    Number of Visits 44    Date for OT Re-Evaluation 04/09/23    Progress Note Due on Visit 10    OT Start Time 1020    OT Stop Time 1100    OT Time Calculation (min) 40 min    Activity Tolerance Patient tolerated treatment well    Behavior During Therapy WFL for tasks assessed/performed                   Past Medical History:  Diagnosis Date   Allergy    Anemia    Bell's palsy    Diabetes mellitus without complication (HCC)    diet controlled   Hypertension    Hypothyroidism    Kidney stones    Pseudotumor cerebri    Stroke [facility])    Past Surgical History:  Procedure Laterality Date   COLONOSCOPY WITH PROPOFOL N/A 03/30/2020   Procedure: COLONOSCOPY WITH PROPOFOL;  Surgeon: Regis Bill, MD;  Location: [facility] ENDOSCOPY;  Service: Endoscopy;  Laterality: N/A;   LOOP RECORDER INSERTION N/A 01/27/2018   Procedure: LOOP RECORDER INSERTION;  Surgeon: Duke Salvia, MD;  Location: [facility] INVASIVE CV LAB;  Service: Cardiovascular;  Laterality: N/A;   LUMBAR PUNCTURE     as child   NO PAST SURGERIES     TEE WITHOUT CARDIOVERSION N/A 01/07/2018   Procedure: TRANSESOPHAGEAL ECHOCARDIOGRAM (TEE);  Surgeon: Antonieta Iba, MD;  Location: [facility] ORS;  Service: Cardiovascular;  Laterality: N/A;   Patient Active Problem List   Diagnosis Date Noted   Unsteady gait 10/05/2022   Steal syndrome of dialysis vascular access (HCC) 05/28/2022   Hydronephrosis, left 05/04/2022   Type II diabetes mellitus with renal manifestations (HCC) 05/04/2022   Leukocytosis 05/04/2022   Pre-op evaluation 04/24/2022   Chest pain 01/13/2022   Diabetic retinopathy associated with diabetes mellitus due to underlying condition (HCC) 01/13/2022    ESRD on hemodialysis (HCC) 01/13/2022   Deafness in right ear 08/11/2021   Headache 07/07/2021   Hearing loss 07/07/2021   Open wound 01/25/2021   History of colon polyps 10/15/2020   Postoperative hemorrhage involving digestive system following digestive system procedure 09/19/2020   Acute cholecystitis without calculus 09/09/2020   Type 2 diabetes mellitus, with long-term current use of insulin (HCC) 09/09/2020   Cryptogenic stroke (HCC) 07/13/2020   History of loop recorder 07/13/2020   Weakness 06/18/2020   History of 2019 novel coronavirus disease (COVID-19) 05/20/2020   ESRD (end stage renal disease) (HCC) 04/04/2020   Pneumonia due to COVID-19 virus 04/03/2020   AKI (acute kidney injury) (HCC) 04/03/2020   Elevated troponin 04/03/2020   Acquired trigger finger 06/08/2019   Lymphedema 06/08/2019   Anemia 04/17/2019   Swelling of left lower extremity 01/10/2019   Facial droop 04/09/2018   Daytime somnolence 03/30/2018   Carotid artery disease (HCC) 02/03/2018   Intracranial vascular stenosis 09/12/2017   Cough 01/20/2017   Bell's palsy 11/10/2016   History of CVA (cerebrovascular accident) 11/10/2016   Benign localized hyperplasia of prostate with urinary obstruction 10/27/2016   History of nephrolithiasis 10/27/2016   TIA (transient ischemic attack) 10/20/2016   Near syncope 06/23/2016   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   Neuropathy 08/06/2015   Health care maintenance 08/06/2015  Essential hypertension 08/06/2015   Heme positive stool 10/10/2013   Hypothyroidism 10/10/2013   Microalbuminuria 10/10/2013   Hyperlipidemia 10/10/2013   B12 deficiency 10/10/2013   Diabetes (HCC) 07/04/2013   Environmental allergies 07/04/2013   ONSET DATE: May 2023  REFERRING DIAG: CVA  THERAPY DIAG:  Muscle weakness (generalized)  Other lack of coordination  Rationale for Evaluation and Treatment: Rehabilitation  SUBJECTIVE:  SUBJECTIVE STATEMENT:   Pt reports  that he is  travelling to [place] this week.  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    PRECAUTIONS: Fall and Other: No strengthening exercises greater than 5-10 pounds in left arm.   Dialysis Tues, Th, Sat.  WEIGHT BEARING RESTRICTIONS: No  PAIN:  Are you having pain? No  FALLS: Has patient fallen in last 6 months? No  LIVING ENVIRONMENT: Lives with: alone Lives in: House/apartment Stairs: Yes: Internal: 15 steps; on right going up Has following equipment at home: Single point cane and Walker - 2 wheeled  PLOF: Independent  PATIENT GOALS: Pt reports he wants to have better use of his left hand, be able to do buttons and shoes.   OBJECTIVE:   HAND DOMINANCE: Right  ADLs: Overall ADLs: modified independent with most tasks Transfers/ambulation related to ADLs: Now walking without an assistive device, used cane in the past.  Eating: Independent with self feeding, difficulty with cutting food, requires increased focus and effort. Grooming: Independent UB Dressing: Difficulty with buttons, uses pullovers most of the time  LB Dressing: Difficulty with tying shoes Toileting: Independent Bathing: Independent Tub Shower transfers: Independent Equipment: none  IADLs: Shopping: independent,  uses a cart to push in the store Light housekeeping: modified independent Meal Prep: difficulty with tasks requiring use of left hand, bilateral hand use.   Community mobility: Ambulates without use of assistive device, able to drive Medication management: Independent Financial management:  Independent Handwriting: 90% legible  MOBILITY STATUS: Needs Assist: Pt has progressed with PT, used to use a cane but ambulating without an assistive device but still has balance difficulties.    POSTURE COMMENTS:    Sitting balance: Sits without UE support up to 30 sec  ACTIVITY TOLERANCE: Activity tolerance: limited activity tolerance  FUNCTIONAL OUTCOME MEASURES: FOTO: 53 12/11/22: FOTO: 55 01/15/23: 56  UPPER EXTREMITY ROM:    Active ROM Right eval Left eval  Shoulder flexion [facility]  Shoulder abduction [facility]  Shoulder adduction [facility]  Shoulder extension [facility]  Shoulder internal rotation [facility]  Shoulder external rotation [facility]  Elbow flexion [facility] WFL  Elbow extension [facility]  Wrist flexion [facility] WFL  Wrist extension [facility] WFL  Wrist ulnar deviation [facility] WFL  Wrist radial deviation [facility] WFL  Wrist pronation [facility] WFL  Wrist supination WFL WFL  (Blank rows = not tested)  UPPER EXTREMITY MMT:     MMT Right eval Left eval Left 01/15/23  Shoulder flexion 5/5 3+/5 4+  Shoulder abduction 5/5 3+/5 4+  Shoulder adduction     Shoulder extension     Shoulder internal rotation     Shoulder external rotation     Middle trapezius  Lower trapezius     Elbow flexion 5/5 3+/5 5  Elbow extension 5/5 3+/5 4+  Wrist flexion 5/5 3+/5 4+  Wrist extension 5/5 3+/5 4+  Wrist ulnar deviation 5/5 3+/5 4+  Wrist radial deviation 5/5 3+/5 4+  Wrist pronation 5/5 3+/5 4+  Wrist supination 5/5 3+/5 4+  (Blank rows = not tested)  Opposition of thumb to all digits on right, on left hand opposition to ring finger but cannot demonstrate to small finger.   HAND FUNCTION: Grip strength: Right: 44 lbs; Left: 35 lbs, Lateral pinch: Right: 17 lbs, Left: 16 lbs, 3 point pinch: Right: 14 lbs, Left: 0 lbs, and Tip pinch: Right 2 lbs, Left: 0 lbs 12/23/2022: Grip strength: Right: 50 lbs; Left: 43 lbs, Lateral pinch: Right: 10 lbs, Left: 8 lbs, 3 point pinch: Right:  9 lbs, Left: 8 lbs,  and Tip pinch: Right 8 lbs, Left: 4 lbs 01/15/23: R grip 45 lbs, Left: 34 lbs; R lateral pinch 10 lbs, L 7 lbs; 3 point pinch R: 7 lbs, L 7 lbs; tip pinch R: 8 lbs, L 4 lbs (At dialysis yesterday, pt reports they pulled a lot of fluid and pt was so tired that he almost got sick) 02/17/2023: Grip strength: Right: 50 lbs; Left: 45  lbs, Lateral pinch: Right: 10  lbs, Left: 17 lbs, 3 point pinch: Right: 9  lbs, Left: 10 lbs, and Tip pinch: Right  8 lbs, Left: 5 lbs 03/05/2023: Grip strength: Right: 50 lbs; Left: 41  lbs, Lateral pinch: Right: 10 lbs, Left: 10 lbs, 3 point pinch: Right: 9  lbs, Left: 7 lbs, and Tip pinch: Right  8 lbs, Left: 7 lbs  COORDINATION: 9 Hole Peg test: Right: 36 sec; Left: 56 sec 12/23/2022: 9 Hole Peg test: Right: 32 sec; Left: 45 sec 01/15/23: R hand 26 sec, Left: 43 sec 02/17/23: R hand 39 sec, Left: 53 sec 03/05/23: R hand 36 sec, Left: 50 sec  TODAY'S TREATMENT: 03/10/2023     Therapeutic Exercise:  Pt. worked on 2nd through 5th digit extension at the tabletop with holding for resistance. Pt. worked on reps of finger abduction, and adduction at the tabletop surface. Pt. performed reps of intrinsic stretching while blocking the PIPS, and DIPs. Pt. performed gross gripping with a gross grip strengthener. Pt. worked on sustaining grip while grasping pegs and reaching at various heights. The gripper was set to 17.9 # of grip strength resistance x's 2 trials. Pt. Worked on Digit flexion using the DigiFlex 1.5#. Pt. worked on grasping 1" resistive cubes alternating thumb opposition to the tip of the 2nd-4th digit while the board is placed at a vertical angle. Pt. worked on pressing the cubes back into place while alternating isolated 2nd through 4th digit extension.      PATIENT EDUCATION: Education details: intrinsic strengthening for L hand Person educated: Patient Education method: Explanation, demo, written handout Education comprehension: verbalized understanding, min  vc, tactile cues  HOME EXERCISE PROGRAM: Theraputty, tendon glides, intrinsic strengthening with digit abd/add and lumbrical isometric strengthening.  GOALS: Goals reviewed with patient? Yes  SHORT TERM GOALS: Target date:  02/26/2023   Pt will demonstrate ability to hold and utilize utensils for cutting meat with modified independence. Baseline: difficulty with cutting, stabilizing item with fork for cutting; 01/15/23: pt reports he can cut meat, but it's awkward Goal status: MET  2.  Pt will demonstrate ability to obtain keys from left pocket without difficult and without dropping.   Baseline: pt  struggles with reaching into left pocket to get keys; 5/22: still challenging but can do it without dropping Goal status: MET   LONG TERM GOALS: Target date: 04/09/2023   Pt will demonstrate HEP with modified independence.   Baseline: no current program for left UE. (Has green tband but needs educ on exercises); 01/15/23: using theraputty and has recently begun to focus on L hand intrinsic strengthening; 03/05/23 continue to update HEP as he progresses Goal status: IN PROGRESS  2.  Pt will improve left UE coordination by 10 secs on 9 hole peg test to demonstrate shoe tying with modified independence.  Baseline: currently wearing slip on shoes and unable to tie shoes, left 9 hole peg test in 56 sec at eval; 01/15/23: pt reports he can tie shoes, sometimes requires multiple attempts, but can do it.  Often chooses to wear slip ons as this is still challenging; L 9 hole 43 sec Goal status: IN PROGRESS/ partially met  3.  Pt will demonstrate improved coordination in left hand to demonstrate buttoning buttons on clothing with modified independence.  Baseline: difficulty with buttons and has been wearing more pullovers to avoid challenge of buttons; 01/15/23: pt reports still challenging Goal status: MET  4.  Pt will demonstrate improvement in left grip strength by 5# to open jars and containers with  modified independence and greater ease.   Baseline: Difficulty with opening jars and containers with use of left hand at eval; 01/15/23: pt reports easier time opening a water bottle top without dicem, but uses dycem to open jars (increased L hand weakness as compared to last progress update, but pt reports increased weakness this week from having excess fluid pulled off him during dialysis and would like to re-measure on a Monday after pt has had a couple days off of dialysis). Goal status: IN PROGRESS  5.  Pt will improve left UE strength by 1 mm grade to hold and hand wash dishes with modified independence.  Baseline: difficulty with being able to hand wash dishes at eval, difficulty stabilizing dish; 01/15/23: easier with dishes, harder with a heavy pan Goal status: MET  6.  Pt will demonstrate FOTO score of 59 or greater to show a clinically relevant change in LUE to impact greater independence with daily ADL and IADL tasks at home and in the community.   Baseline: Eval: 53; 12/11/22: 55; 01/15/23: 56 03/05/23: score of 60 Goal status: MET  ASSESSMENT:  CLINICAL IMPRESSION:  Pt. continues to present with left hand intrinsic weakness, and requires verbal, and tactile cues for form and technique during the exercises especially with formulating intrinsic stretches. Pt. dropped multiple pegs while sustaining his grip when reaching up in preparation for placing them into a container positioned at multiple heights. Pt. requires cues to alternate digit flexion with the DigiFlex. Pt. Presents with difficulty formulating thumb opposition to the 5th digit. Pt. continues to present with left hand intrinsic weakness, and requires verbal, and tactile cues for form and technique during the exercises. Pt continues to benefit from skilled OT services to work towards goals in plan of care to improve left UE function for daily tasks.    PERFORMANCE DEFICITS: in functional skills including ADLs, IADLs, coordination,  dexterity, sensation, tone, ROM, strength, flexibility, Fine motor control, balance, endurance, decreased knowledge of use of DME, and UE functional use, cognitive skills including, and psychosocial skills including environmental adaptation, habits, and routines and behaviors.   IMPAIRMENTS: are limiting patient from ADLs, IADLs, and social participation.  CO-MORBIDITIES: has co-morbidities such as CAD, ESRD with dialysis, deafness in right ear, diabetic retinopathy, HTN, neuropathy  that affects occupational performance. Patient will benefit from skilled OT to address above impairments and improve overall function.  MODIFICATION OR ASSISTANCE TO COMPLETE EVALUATION: Min-Moderate modification of tasks or assist with assess necessary to complete an evaluation.  OT OCCUPATIONAL PROFILE AND HISTORY: Comprehensive assessment: Review of records and extensive additional review of physical, cognitive, psychosocial history related to current functional performance.  CLINICAL DECISION MAKING: Moderate - several treatment options, min-mod task modification necessary  REHAB POTENTIAL: Good  EVALUATION COMPLEXITY: Moderate PLAN:  OT FREQUENCY: 2x/week  OT DURATION: 12 weeks  PLANNED INTERVENTIONS: self care/ADL training, therapeutic exercise, therapeutic activity, neuromuscular re-education, manual therapy, balance training, paraffin, moist heat, contrast bath, patient/family education, and DME and/or AE instructions  RECOMMENDED OTHER SERVICES: Pt is being seen by PT currently.   CONSULTED AND AGREED WITH PLAN OF CARE: Patient  PLAN FOR NEXT SESSION: grip, pinch and coordination skills.  Olegario Messier, MS, OTR/L 03/10/2023, 2:16 PM

## 2023-03-11 ENCOUNTER — Ambulatory Visit: Payer: Medicare Other

## 2023-03-12 ENCOUNTER — Ambulatory Visit: Payer: Medicare Other | Admitting: Occupational Therapy

## 2023-03-12 ENCOUNTER — Ambulatory Visit: Payer: Medicare Other

## 2023-03-13 ENCOUNTER — Ambulatory Visit: Payer: Medicare Other

## 2023-03-17 ENCOUNTER — Ambulatory Visit: Payer: Medicare Other

## 2023-03-17 ENCOUNTER — Ambulatory Visit: Payer: Medicare Other | Admitting: Occupational Therapy

## 2023-03-18 ENCOUNTER — Ambulatory Visit: Payer: Medicare Other

## 2023-03-19 ENCOUNTER — Ambulatory Visit: Payer: Medicare Other | Admitting: Occupational Therapy

## 2023-03-19 ENCOUNTER — Ambulatory Visit: Payer: Medicare Other

## 2023-03-20 ENCOUNTER — Ambulatory Visit: Payer: Medicare Other

## 2023-03-21 ENCOUNTER — Telehealth: Payer: Self-pay | Admitting: Cardiology

## 2023-03-21 NOTE — Telephone Encounter (Signed)
Left vm (cell ph) to schedule Echo due in October.  J.Britt, 03-21-23

## 2023-03-24 ENCOUNTER — Ambulatory Visit: Payer: Medicare Other

## 2023-03-25 ENCOUNTER — Ambulatory Visit: Payer: Medicare Other

## 2023-03-26 ENCOUNTER — Ambulatory Visit: Payer: Medicare Other | Admitting: Occupational Therapy

## 2023-03-26 ENCOUNTER — Ambulatory Visit: Payer: Medicare Other

## 2023-03-27 ENCOUNTER — Ambulatory Visit: Payer: Medicare Other

## 2023-03-31 ENCOUNTER — Ambulatory Visit: Payer: Medicare Other

## 2023-04-01 ENCOUNTER — Ambulatory Visit: Payer: Medicare Other

## 2023-04-02 ENCOUNTER — Ambulatory Visit: Payer: Medicare Other

## 2023-04-03 ENCOUNTER — Ambulatory Visit: Payer: Medicare Other

## 2023-04-03 NOTE — Therapy (Signed)
OUTPATIENT PHYSICAL THERAPY NEURO TREATMENT   Patient Name: Gregory Crane MRN: [id number] DOB:Oct 05, 1970, 52 y.o., male Today's Date: 04/07/2023  PCP: Dale Saco MD REFERRING PROVIDER: Dale  MD  PT End of Session - 04/07/23 0822     Visit Number 65    Number of Visits 80    Date for PT Re-Evaluation 04/28/23    Authorization Type Medicare    Authorization Time Period 11/11/22-02/03/23    Authorization - Number of Visits 12    Progress Note Due on Visit 60    PT Start Time 0821    PT Stop Time 0844    PT Time Calculation (min) 23 min    Equipment Utilized During Treatment Gait belt    Activity Tolerance Patient tolerated treatment well;No increased pain    Behavior During Therapy WFL for tasks assessed/performed                 Past Medical History:  Diagnosis Date   Allergy    Anemia    Bell's palsy    Diabetes mellitus without complication (HCC)    diet controlled   Hypertension    Hypothyroidism    Kidney stones    Pseudotumor cerebri    Stroke [facility])    Past Surgical History:  Procedure Laterality Date   COLONOSCOPY WITH PROPOFOL N/A 03/30/2020   Procedure: COLONOSCOPY WITH PROPOFOL;  Surgeon: Regis Bill, MD;  Location: [facility] ENDOSCOPY;  Service: Endoscopy;  Laterality: N/A;   LOOP RECORDER INSERTION N/A 01/27/2018   Procedure: LOOP RECORDER INSERTION;  Surgeon: Duke Salvia, MD;  Location: [facility] INVASIVE CV LAB;  Service: Cardiovascular;  Laterality: N/A;   LUMBAR PUNCTURE     as child   NO PAST SURGERIES     TEE WITHOUT CARDIOVERSION N/A 01/07/2018   Procedure: TRANSESOPHAGEAL ECHOCARDIOGRAM (TEE);  Surgeon: Antonieta Iba, MD;  Location: [facility] ORS;  Service: Cardiovascular;  Laterality: N/A;   Patient Active Problem List   Diagnosis Date Noted   Unsteady gait 10/05/2022   Steal syndrome of dialysis vascular access (HCC) 05/28/2022   Hydronephrosis, left 05/04/2022   Type II diabetes mellitus with renal manifestations (HCC)  05/04/2022   Leukocytosis 05/04/2022   Pre-op evaluation 04/24/2022   Chest pain 01/13/2022   Diabetic retinopathy associated with diabetes mellitus due to underlying condition (HCC) 01/13/2022   ESRD on hemodialysis (HCC) 01/13/2022   Deafness in right ear 08/11/2021   Headache 07/07/2021   Hearing loss 07/07/2021   Open wound 01/25/2021   History of colon polyps 10/15/2020   Postoperative hemorrhage involving digestive system following digestive system procedure 09/19/2020   Acute cholecystitis without calculus 09/09/2020   Type 2 diabetes mellitus, with long-term current use of insulin (HCC) 09/09/2020   Cryptogenic stroke (HCC) 07/13/2020   History of loop recorder 07/13/2020   Weakness 06/18/2020   History of 2019 novel coronavirus disease (COVID-19) 05/20/2020   ESRD (end stage renal disease) (HCC) 04/04/2020   Pneumonia due to COVID-19 virus 04/03/2020   AKI (acute kidney injury) (HCC) 04/03/2020   Elevated troponin 04/03/2020   Acquired trigger finger 06/08/2019   Lymphedema 06/08/2019   Anemia 04/17/2019   Swelling of left lower extremity 01/10/2019   Facial droop 04/09/2018   Daytime somnolence 03/30/2018   Carotid artery disease (HCC) 02/03/2018   Intracranial vascular stenosis 09/12/2017   Cough 01/20/2017   Bell's palsy 11/10/2016   History of CVA (cerebrovascular accident) 11/10/2016   Benign localized hyperplasia of prostate with urinary obstruction 10/27/2016  History of nephrolithiasis 10/27/2016   TIA (transient ischemic attack) 10/20/2016   Near syncope 06/23/2016   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   Neuropathy 08/06/2015   Health care maintenance 08/06/2015   Essential hypertension 08/06/2015   Heme positive stool 10/10/2013   Hypothyroidism 10/10/2013   Microalbuminuria 10/10/2013   Hyperlipidemia 10/10/2013   B12 deficiency 10/10/2013   Diabetes (HCC) 07/04/2013   Environmental allergies 07/04/2013   ONSET DATE: 2-3 years  REFERRING DIAG:  Neuropathy  THERAPY DIAG:  Muscle weakness (generalized)  Unsteadiness on feet  Abnormality of gait and mobility  Rationale for Evaluation and Treatment Rehabilitation  SUBJECTIVE:                                                                                                                                                                                             SUBJECTIVE STATEMENT: Patient has returned from trip. Has not been seen in a month. Arrived late due to sleeping through alarm.    Pt accompanied by: self    PERTINENT HISTORY: Patient presents to physical therapy for neuropathy. He underwent banding of his L UE fistula on 05/13/22 and scheduled for additional banding on 06/07/22. 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 Patient reports his balance is very unsteady due to neuropathy in feet, feels weak in LE's.   PAIN:  Denies pain, but reports stiffness   PRECAUTIONS: Fall  WEIGHT BEARING RESTRICTIONS Yes no lifting >5 lb in arm   FALLS: Has patient fallen in last 6 months? No  PLOF: Independent  PATIENT GOALS to be more steady and walk as normally as possible.   OBJECTIVE:   TODAY'S TREATMENT:   Unless otherwise stated, CGA was provided and gait belt donned in order to ensure pt safety  TherEx:  6" step: -step up/down 15x each side -eccentric heel tap 8x each LE; airex pad to modify; patient reports challenging -hip hike 10x each side ; challenging to patient  15x STS ; broken into 3 sets of 5 standing heel raise 15x  Seated hedgehog lateral step over 15x each LE ; LLE more challenging Seated heel toe raises 15x      PATIENT EDUCATION: Education details: Pt educated throughout session about proper posture and technique with exercises. Improved exercise  technique, movement at target joints, use of target muscles after min to mod verbal, visual, tactile cues.  Person educated: Patient Education method: Explanation, Demonstration, Tactile cues, and Verbal cues Education comprehension: verbalized understanding, returned demonstration, verbal cues required, and  tactile cues required  HOME EXERCISE PROGRAM: Access Code: [email or web address] URL: [email or web address] Date: 03/10/2023 Prepared by: Precious Bard Exercises - Seated Heel Toe Raises  - 1 x daily - 7 x weekly - 2 sets - 10 reps - 5 hold - Standing Tandem Balance with Counter Support  - 1 x daily - 7 x weekly - 2 sets - 2 reps - 30 hold - Standing March with Counter Support  - 1 x daily - 7 x weekly - 2 sets - 10 reps - 5 hold - Ankle Inversion Eversion Towel Slide  - 1 x daily - 7 x weekly - 1 sets - 15 reps - 5 hold - Standing Hip Flexor Stretch  - 1 x daily - 7 x weekly - 2 sets - 1 reps - 60 hold - Ankle Rotations in Sitting  - 1 x daily - 7 x weekly - 1 sets - 20 reps - 5 hold - Supine Piriformis Stretch with Leg Straight  - 1 x daily - 7 x weekly - 2 sets - 2 reps - 60 hold   GOALS: Goals reviewed with patient? Yes   SHORT TERM GOALS: Target date: 07/01/2022  Patient will be independent in home exercise program to improve strength/mobility for better functional independence with ADLs. Baseline:10/9; HEP given 2/21: HEP compliant  Goal status: MET   LONG TERM GOALS: Target date:04/28/2023    Patient will increase FOTO score to equal to or greater than   63%  to demonstrate statistically significant improvement in mobility and quality of life.  Baseline: 10/9: 53% 11/29: 58% 12/27: 51% 1/17: 56% 2/21: 53% 3/18: 60%  4/8 58%; 6/26: 60% Goal status: IN PROGRESS  2.  Patient (< 59 years old) will complete five times sit to stand test in < 10 seconds without UE support indicating an increased LE strength and improved balance. Baseline: 10/9: 19.6 seconds with walking stick; one LOB  11/29: 20.56 seconds 12/27: 13.4 seconds hands on knees 1/17: 13 seconds hands on knees  2/21: 14 seconds no hands 3/18: 12 seconds no hands. 4/8 11.3 sec UE on knees. 5/13: 10.94 sec pushing from thighs. 6/24: 10.08 sec, pushing from thighs  Goal status: IN PROGRESS  3.  Patient will increase Berg Balance score by > 45/56 to demonstrate decreased fall risk during functional activities. Baseline: 10/9: 33/56 11/29: 02/72 53/66: 44/03 4/74:  25/95 2/21: 46/56 3/18: 48/56 Goal status: MET  +4.  Patient will increase 10 meter walk test to >1.27m/s as to improve gait speed for better community ambulation and to reduce fall risk. Baseline: 11/29: 1.19 m/s Goal status: MET  5.  Patient will increase ABC scale score >80% to demonstrate better functional mobility and better confidence with ADLs.  Baseline: 10/9: 67% 11/29: 75.6% 12/27: 78%  2/21: 78% 3/18: 80% Goal status: MET  6. Patient will tolerate 5 seconds of single leg stance without loss of balance to improve ability to get in and out of shower safely. Baseline: 11/29: unable to stand on single leg safely 12/27: 2 seconds 1/17: 3 seconds 2/21: 3 seconds 3/18: 4 seconds. 4/8: 3 sec  5/13: LLE x 3 sec and RLE x 4 sec; 6/26:  Goal status: IN PROGRESS   6. Patient will increase six minute walk test distance to >1000 for progression to community ambulator and improve gait ability Baseline: 12/27: 845 ft with with walking stick 1/17: 71ft 2/21: 930 ft  3/18: 950 ft 4/8: 1158ft. 5/13: 1185 Goal status: met  7.  Patient will increase dynamic gait index score to >19/24 as to demonstrate reduced fall risk and improved dynamic gait balance for better safety with community/home ambulation.  Baseline: 3/18:  9/24. 4/8: 16/24 5/13: 18/24 Goal status: in Progress  8. Patient will increase six minute walk test distance to >1500 for progression to community ambulator and improve gait ability Baseline: . 5/13: 1185; 6/26: 1145 with good gait mechanics   Goal status: IN PROGRESS    ASSESSMENT: CLINICAL IMPRESSION: . Patient severely limited by late arrival. He is weakened by prolonged illness (stomach bug for past three weeks). Rest breaks required throughout session. Patient reports he is not attending dialysis, educated on need to inform physician due to it eing critical to his health. The pt will continue to benefit from skilled PT to make progress in BLE strength, balance, and coordination to return to PLOF and improve QoL.    OBJECTIVE IMPAIRMENTS Abnormal gait, decreased activity tolerance, decreased balance, decreased coordination, decreased endurance, decreased mobility, difficulty walking, decreased strength, impaired flexibility, impaired sensation, improper body mechanics, and pain.   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   REHAB POTENTIAL: Good  CLINICAL DECISION MAKING: Evolving/moderate complexity  EVALUATION COMPLEXITY: Moderate  PLAN: PT FREQUENCY: 2x/week  PT DURATION: 12 weeks  PLANNED INTERVENTIONS: Therapeutic exercises, Therapeutic activity, Neuromuscular re-education, Balance training, Gait training, Patient/Family education, Self Care, Joint mobilization, Stair training, Vestibular training,  Canalith repositioning, Visual/preceptual remediation/compensation, DME instructions, Dry Needling, Cognitive remediation, Spinal mobilization, Cryotherapy, Moist heat, Manual lymph drainage, Compression bandaging, Taping, Vasopneumatic device, Ultrasound, Manual therapy, and Re-evaluation  PLAN FOR NEXT SESSION:   Follow up re: ankle stab.; calf strength, uneven surfaces; pt reports feeling weakness in hips lately, revisit flexor and glute strengthening    Precious Bard PT ,DPT Physical Therapist- [place]  [facility]

## 2023-04-04 ENCOUNTER — Ambulatory Visit: Payer: Medicare Other | Admitting: Podiatry

## 2023-04-07 ENCOUNTER — Encounter: Payer: Self-pay | Admitting: Occupational Therapy

## 2023-04-07 ENCOUNTER — Ambulatory Visit: Payer: Medicare Other | Attending: Internal Medicine

## 2023-04-07 ENCOUNTER — Ambulatory Visit: Payer: Medicare Other | Admitting: Occupational Therapy

## 2023-04-07 DIAGNOSIS — R269 Unspecified abnormalities of gait and mobility: Secondary | ICD-10-CM | POA: Insufficient documentation

## 2023-04-07 DIAGNOSIS — R278 Other lack of coordination: Secondary | ICD-10-CM | POA: Insufficient documentation

## 2023-04-07 DIAGNOSIS — R262 Difficulty in walking, not elsewhere classified: Secondary | ICD-10-CM | POA: Insufficient documentation

## 2023-04-07 DIAGNOSIS — M6281 Muscle weakness (generalized): Secondary | ICD-10-CM | POA: Diagnosis present

## 2023-04-07 DIAGNOSIS — R2681 Unsteadiness on feet: Secondary | ICD-10-CM | POA: Diagnosis present

## 2023-04-07 DIAGNOSIS — R2689 Other abnormalities of gait and mobility: Secondary | ICD-10-CM | POA: Diagnosis present

## 2023-04-07 NOTE — Therapy (Signed)
OCCUPATIONAL THERAPY NEURO TREATMENT NOTE  Patient Name: Gregory Crane MRN: [id number] DOB:[birth year], 52 y.o., male  PCP: Dale [place] REFERRING PROVIDER: Dale [place]  END OF SESSION:  OT End of Session - 04/07/23 0844     Visit Number 32    Number of Visits 44    Date for OT Re-Evaluation 04/09/23    Progress Note Due on Visit 10    OT Start Time 0845    OT Stop Time 0930    OT Time Calculation (min) 45 min    Activity Tolerance Patient tolerated treatment well    Behavior During Therapy [facility] for tasks assessed/performed                   Past Medical History:  Diagnosis Date   Allergy    Anemia    Bell's palsy    Diabetes mellitus without complication (HCC)    diet controlled   Hypertension    Hypothyroidism    Kidney stones    Pseudotumor cerebri    Stroke [facility])    Past Surgical History:  Procedure Laterality Date   COLONOSCOPY WITH PROPOFOL N/A 03/30/2020   Procedure: COLONOSCOPY WITH PROPOFOL;  Surgeon: Regis Bill, MD;  Location: [facility] ENDOSCOPY;  Service: Endoscopy;  Laterality: N/A;   LOOP RECORDER INSERTION N/A 01/27/2018   Procedure: LOOP RECORDER INSERTION;  Surgeon: Duke Salvia, MD;  Location: [facility] INVASIVE CV LAB;  Service: Cardiovascular;  Laterality: N/A;   LUMBAR PUNCTURE     as child   NO PAST SURGERIES     TEE WITHOUT CARDIOVERSION N/A 01/07/2018   Procedure: TRANSESOPHAGEAL ECHOCARDIOGRAM (TEE);  Surgeon: Antonieta Iba, MD;  Location: [facility] ORS;  Service: Cardiovascular;  Laterality: N/A;   Patient Active Problem List   Diagnosis Date Noted   Unsteady gait 10/05/2022   Steal syndrome of dialysis vascular access (HCC) 05/28/2022   Hydronephrosis, left 05/04/2022   Type II diabetes mellitus with renal manifestations (HCC) 05/04/2022   Leukocytosis 05/04/2022   Pre-op evaluation 04/24/2022   Chest pain 01/13/2022   Diabetic retinopathy associated with diabetes mellitus due to underlying condition (HCC) 01/13/2022    ESRD on hemodialysis (HCC) 01/13/2022   Deafness in right ear 08/11/2021   Headache 07/07/2021   Hearing loss 07/07/2021   Open wound 01/25/2021   History of colon polyps 10/15/2020   Postoperative hemorrhage involving digestive system following digestive system procedure 09/19/2020   Acute cholecystitis without calculus 09/09/2020   Type 2 diabetes mellitus, with long-term current use of insulin (HCC) 09/09/2020   Cryptogenic stroke (HCC) 07/13/2020   History of loop recorder 07/13/2020   Weakness 06/18/2020   History of 2019 novel coronavirus disease (COVID-19) 05/20/2020   ESRD (end stage renal disease) (HCC) 04/04/2020   Pneumonia due to COVID-19 virus 04/03/2020   AKI (acute kidney injury) (HCC) 04/03/2020   Elevated troponin 04/03/2020   Acquired trigger finger 06/08/2019   Lymphedema 06/08/2019   Anemia 04/17/2019   Swelling of left lower extremity 01/10/2019   Facial droop 04/09/2018   Daytime somnolence 03/30/2018   Carotid artery disease (HCC) 02/03/2018   Intracranial vascular stenosis 09/12/2017   Cough 01/20/2017   Bell's palsy 11/10/2016   History of CVA (cerebrovascular accident) 11/10/2016   Benign localized hyperplasia of prostate with urinary obstruction 10/27/2016   History of nephrolithiasis 10/27/2016   TIA (transient ischemic attack) 10/20/2016   Near syncope 06/23/2016   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   Neuropathy 08/06/2015   Health care maintenance 08/06/2015  Essential hypertension 08/06/2015   Heme positive stool 10/10/2013   Hypothyroidism 10/10/2013   Microalbuminuria 10/10/2013   Hyperlipidemia 10/10/2013   B12 deficiency 10/10/2013   Diabetes (HCC) 07/04/2013   Environmental allergies 07/04/2013   ONSET DATE: May 2023  REFERRING DIAG: CVA  THERAPY DIAG:  Muscle weakness (generalized)  Other lack of coordination  Rationale for Evaluation and Treatment: Rehabilitation  SUBJECTIVE:  SUBJECTIVE STATEMENT:   Pt reports that he has  been sick the past few weeks but is finally better.   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    PRECAUTIONS: Fall and Other: No strengthening exercises greater than 5-10 pounds in left arm.   Dialysis Tues, Th, Sat.  WEIGHT BEARING RESTRICTIONS: No  PAIN:  Are you having pain? No  FALLS: Has patient fallen in last 6 months? No  LIVING ENVIRONMENT: Lives with: alone Lives in: House/apartment Stairs: Yes: Internal: 15 steps; on right going up Has following equipment at home: Single point cane and Walker - 2 wheeled  PLOF: Independent  PATIENT GOALS: Pt reports he wants to have better use of his left hand, be able to do buttons and shoes.   OBJECTIVE:   HAND DOMINANCE: Right  ADLs: Overall ADLs: modified independent with most tasks Transfers/ambulation related to ADLs: Now walking without an assistive device, used cane in the past.  Eating: Independent with self feeding, difficulty with cutting food, requires increased focus and effort. Grooming: Independent UB Dressing: Difficulty with buttons, uses pullovers most of the time  LB Dressing: Difficulty with tying shoes Toileting: Independent Bathing: Independent Tub Shower transfers: Independent Equipment: none  IADLs: Shopping: independent,  uses a cart to push in the store Light housekeeping: modified independent Meal Prep: difficulty with tasks requiring use of left hand, bilateral hand use.   Community mobility: Ambulates without use of assistive device, able to drive Medication management: Independent Financial management:  Independent Handwriting: 90% legible  MOBILITY STATUS: Needs Assist: Pt has progressed with PT, used to use a cane but ambulating without an assistive device but still has balance difficulties.    POSTURE COMMENTS:    Sitting balance: Sits without UE support up to 30 sec  ACTIVITY TOLERANCE: Activity tolerance: limited activity tolerance  FUNCTIONAL OUTCOME MEASURES: FOTO: 53 12/11/22: FOTO: 55 01/15/23: 56  UPPER EXTREMITY ROM:    Active ROM Right eval Left eval  Shoulder flexion [facility]  Shoulder abduction [facility]  Shoulder adduction [facility]  Shoulder extension [facility]  Shoulder internal rotation [facility]  Shoulder external rotation [name]  Elbow flexion [facility] WFL  Elbow extension [facility]  Wrist flexion [facility] WFL  Wrist extension [facility] WFL  Wrist ulnar deviation [facility] WFL  Wrist radial deviation [facility] WFL  Wrist pronation [facility] WFL  Wrist supination WFL WFL  (Blank rows = not tested)  UPPER EXTREMITY MMT:     MMT Right eval Left eval Left 01/15/23  Shoulder flexion 5/5 3+/5 4+  Shoulder abduction 5/5 3+/5 4+  Shoulder adduction     Shoulder extension     Shoulder internal rotation     Shoulder external rotation  Middle trapezius     Lower trapezius     Elbow flexion 5/5 3+/5 5  Elbow extension 5/5 3+/5 4+  Wrist flexion 5/5 3+/5 4+  Wrist extension 5/5 3+/5 4+  Wrist ulnar deviation 5/5 3+/5 4+  Wrist radial deviation 5/5 3+/5 4+  Wrist pronation 5/5 3+/5 4+  Wrist supination 5/5 3+/5 4+  (Blank rows = not tested)  Opposition of thumb to all digits on right, on left hand opposition to ring finger but cannot demonstrate to small finger.   HAND FUNCTION: Grip strength: Right: 44 lbs; Left: 35 lbs, Lateral pinch: Right: 17 lbs, Left: 16 lbs, 3 point pinch: Right: 14 lbs, Left: 0 lbs, and Tip pinch: Right 2 lbs, Left: 0 lbs 12/23/2022: Grip strength: Right: 50 lbs; Left: 43 lbs, Lateral pinch: Right: 10 lbs, Left: 8 lbs, 3 point pinch: Right:  9 lbs, Left: 8 lbs,  and Tip pinch: Right 8 lbs, Left: 4 lbs 01/15/23: R grip 45 lbs, Left: 34 lbs; R lateral pinch 10 lbs, L 7 lbs; 3 point pinch R: 7 lbs, L 7 lbs; tip pinch R: 8 lbs, L 4 lbs (At dialysis yesterday, pt reports they pulled a lot of fluid and pt was so tired that he almost got sick) 02/17/2023: Grip strength: Right: 50 lbs; Left: 45  lbs, Lateral pinch: Right: 10  lbs, Left: 17 lbs, 3 point pinch: Right: 9  lbs, Left: 10 lbs, and Tip pinch: Right  8 lbs, Left: 5 lbs 03/05/2023: Grip strength: Right: 50 lbs; Left: 41  lbs, Lateral pinch: Right: 10 lbs, Left: 10 lbs, 3 point pinch: Right: 9  lbs, Left: 7 lbs, and Tip pinch: Right  8 lbs, Left: 7 lbs  COORDINATION: 9 Hole Peg test: Right: 36 sec; Left: 56 sec 12/23/2022: 9 Hole Peg test: Right: 32 sec; Left: 45 sec 01/15/23: R hand 26 sec, Left: 43 sec 02/17/23: R hand 39 sec, Left: 53 sec 03/05/23: R hand 36 sec, Left: 50 sec  TODAY'S TREATMENT: 04/07/2023     Therapeutic Exercise:  Pt. performed gross gripping with a gross grip strengthener. Pt. worked on sustaining grip while grasping pegs and reaching at various heights. The gripper was set to 17.9 # of grip strength resistance x's 3 trials. Pt. worked on grasping 1" resistive cubes alternating thumb opposition to the tip of the 2nd-3th digit while the board is placed at a horizontal and vertical angle. Pt. worked on pressing the cubes back into place while alternating isolated thumb and 2nd digit extension. Cues to avoid R shoulder hiking while reaching with elbow in full extension to remove cubes from vertically angled board. Pt. Worked on pinch strengthening in the left hand for lateral, and 3pt. pinch using yellow, red, green, and blue resistive clips. Pt. worked on placing the clips at various vertical and horizontal angles. Tactile and verbal cues were required for eliciting the desired movement. Utilizes 3 point pinch for yellow, red, and green clips, requires lateral key pinch to complete blue and  black clips.     Therapeutic Activity: Pt worked on grasping washers of various sizes from magnetic bowl and threaded onto vertical dowel. Requires use edge of bowl to grasp coins, unable to grasp from flat surface. Increased difficulty threading washers onto string, glasses donned to complete.      PATIENT EDUCATION: Education details: intrinsic strengthening for L hand Person educated: Patient Education method: Explanation, demo, written handout Education comprehension: verbalized understanding, min vc, tactile cues  HOME EXERCISE  PROGRAM: Theraputty, tendon glides, intrinsic strengthening with digit abd/add and lumbrical isometric strengthening.  GOALS: Goals reviewed with patient? Yes  SHORT TERM GOALS: Target date:  02/26/2023   Pt will demonstrate ability to hold and utilize utensils for cutting meat with modified independence. Baseline: difficulty with cutting, stabilizing item with fork for cutting; 01/15/23: pt reports he can cut meat, but it's awkward Goal status: MET  2.  Pt will demonstrate ability to obtain keys from left pocket without difficult and without dropping.   Baseline: pt struggles with reaching into left pocket to get keys; 5/22: still challenging but can do it without dropping Goal status: MET   LONG TERM GOALS: Target date: 04/09/2023   Pt will demonstrate HEP with modified independence.   Baseline: no current program for left UE. (Has green tband but needs educ on exercises); 01/15/23: using theraputty and has recently begun to focus on L hand intrinsic strengthening; 03/05/23 continue to update HEP as he progresses Goal status: IN PROGRESS  2.  Pt will improve left UE coordination by 10 secs on 9 hole peg test to demonstrate shoe tying with modified independence.  Baseline: currently wearing slip on shoes and unable to tie shoes, left 9 hole peg test in 56 sec at eval; 01/15/23: pt reports he can tie shoes, sometimes requires multiple attempts, but can  do it.  Often chooses to wear slip ons as this is still challenging; L 9 hole 43 sec Goal status: IN PROGRESS/ partially met  3.  Pt will demonstrate improved coordination in left hand to demonstrate buttoning buttons on clothing with modified independence.  Baseline: difficulty with buttons and has been wearing more pullovers to avoid challenge of buttons; 01/15/23: pt reports still challenging Goal status: MET  4.  Pt will demonstrate improvement in left grip strength by 5# to open jars and containers with modified independence and greater ease.   Baseline: Difficulty with opening jars and containers with use of left hand at eval; 01/15/23: pt reports easier time opening a water bottle top without dicem, but uses dycem to open jars (increased L hand weakness as compared to last progress update, but pt reports increased weakness this week from having excess fluid pulled off him during dialysis and would like to re-measure on a Monday after pt has had a couple days off of dialysis). Goal status: IN PROGRESS  5.  Pt will improve left UE strength by 1 mm grade to hold and hand wash dishes with modified independence.  Baseline: difficulty with being able to hand wash dishes at eval, difficulty stabilizing dish; 01/15/23: easier with dishes, harder with a heavy pan Goal status: MET  6.  Pt will demonstrate FOTO score of 59 or greater to show a clinically relevant change in LUE to impact greater independence with daily ADL and IADL tasks at home and in the community.   Baseline: Eval: 53; 12/11/22: 55; 01/15/23: 56 03/05/23: score of 60 Goal status: MET  ASSESSMENT:  CLINICAL IMPRESSION:  Pt tolerated 17.9# grip strengthener for 3 trials, dropped only 1 or 2 pegs each trial while sustaining grip to reach at shoulder height. Cues to minimize R shoulder hiking while removing 1 inch resistive cubes from shoulder height board. Mild numbness in L fingertips and baseline vision deficits impacting ability to  thread washer on string. Utilizes 3 point pinch for yellow, red, and green clips, requires lateral key pinch to complete blue and black clips. Pt. continues to present with left hand intrinsic weakness, and  requires verbal, and tactile cues for form and technique during the exercises. Pt continues to benefit from skilled OT services to work towards goals in plan of care to improve left UE function for daily tasks.    PERFORMANCE DEFICITS: in functional skills including ADLs, IADLs, coordination, dexterity, sensation, tone, ROM, strength, flexibility, Fine motor control, balance, endurance, decreased knowledge of use of DME, and UE functional use, cognitive skills including, and psychosocial skills including environmental adaptation, habits, and routines and behaviors.   IMPAIRMENTS: are limiting patient from ADLs, IADLs, and social participation.   CO-MORBIDITIES: has co-morbidities such as CAD, ESRD with dialysis, deafness in right ear, diabetic retinopathy, HTN, neuropathy  that affects occupational performance. Patient will benefit from skilled OT to address above impairments and improve overall function.  MODIFICATION OR ASSISTANCE TO COMPLETE EVALUATION: Min-Moderate modification of tasks or assist with assess necessary to complete an evaluation.  OT OCCUPATIONAL PROFILE AND HISTORY: Comprehensive assessment: Review of records and extensive additional review of physical, cognitive, psychosocial history related to current functional performance.  CLINICAL DECISION MAKING: Moderate - several treatment options, min-mod task modification necessary  REHAB POTENTIAL: Good  EVALUATION COMPLEXITY: Moderate PLAN:  OT FREQUENCY: 2x/week  OT DURATION: 12 weeks  PLANNED INTERVENTIONS: self care/ADL training, therapeutic exercise, therapeutic activity, neuromuscular re-education, manual therapy, balance training, paraffin, moist heat, contrast bath, patient/family education, and DME and/or AE  instructions  RECOMMENDED OTHER SERVICES: Pt is being seen by PT currently.   CONSULTED AND AGREED WITH PLAN OF CARE: Patient  PLAN FOR NEXT SESSION: grip, pinch and coordination skills.   Kathie Dike, M.S. OTR/L  04/07/23, 8:46 AM  ascom [phone]

## 2023-04-08 ENCOUNTER — Ambulatory Visit: Payer: Medicare Other

## 2023-04-08 NOTE — Therapy (Signed)
OUTPATIENT PHYSICAL THERAPY NEURO TREATMENT   Patient Name: Gregory Crane MRN: [id number] DOB:1971/06/11, 52 y.o., male Today's Date: 04/09/2023  PCP: Dale [place] MD REFERRING PROVIDER: Dale [place] MD  PT End of Session - 04/09/23 1017     Visit Number 66    Number of Visits 80    Date for PT Re-Evaluation 04/28/23    Authorization Type Medicare    Authorization Time Period 11/11/22-02/03/23    Authorization - Number of Visits 12    Progress Note Due on Visit 60    PT Start Time 1015    PT Stop Time 1058    PT Time Calculation (min) 43 min    Equipment Utilized During Treatment Gait belt    Activity Tolerance Patient tolerated treatment well;No increased pain    Behavior During Therapy WFL for tasks assessed/performed                  Past Medical History:  Diagnosis Date   Allergy    Anemia    Bell's palsy    Diabetes mellitus without complication (HCC)    diet controlled   Hypertension    Hypothyroidism    Kidney stones    Pseudotumor cerebri    Stroke [facility])    Past Surgical History:  Procedure Laterality Date   COLONOSCOPY WITH PROPOFOL N/A 03/30/2020   Procedure: COLONOSCOPY WITH PROPOFOL;  Surgeon: Regis Bill, MD;  Location: [facility] ENDOSCOPY;  Service: Endoscopy;  Laterality: N/A;   LOOP RECORDER INSERTION N/A 01/27/2018   Procedure: LOOP RECORDER INSERTION;  Surgeon: Duke Salvia, MD;  Location: [facility] INVASIVE CV LAB;  Service: Cardiovascular;  Laterality: N/A;   LUMBAR PUNCTURE     as child   NO PAST SURGERIES     TEE WITHOUT CARDIOVERSION N/A 01/07/2018   Procedure: TRANSESOPHAGEAL ECHOCARDIOGRAM (TEE);  Surgeon: Antonieta Iba, MD;  Location: [facility] ORS;  Service: Cardiovascular;  Laterality: N/A;   Patient Active Problem List   Diagnosis Date Noted   Unsteady gait 10/05/2022   Steal syndrome of dialysis vascular access (HCC) 05/28/2022   Hydronephrosis, left 05/04/2022   Type II diabetes mellitus with renal manifestations (HCC)  05/04/2022   Leukocytosis 05/04/2022   Pre-op evaluation 04/24/2022   Chest pain 01/13/2022   Diabetic retinopathy associated with diabetes mellitus due to underlying condition (HCC) 01/13/2022   ESRD on hemodialysis (HCC) 01/13/2022   Deafness in right ear 08/11/2021   Headache 07/07/2021   Hearing loss 07/07/2021   Open wound 01/25/2021   History of colon polyps 10/15/2020   Postoperative hemorrhage involving digestive system following digestive system procedure 09/19/2020   Acute cholecystitis without calculus 09/09/2020   Type 2 diabetes mellitus, with long-term current use of insulin (HCC) 09/09/2020   Cryptogenic stroke (HCC) 07/13/2020   History of loop recorder 07/13/2020   Weakness 06/18/2020   History of 2019 novel coronavirus disease (COVID-19) 05/20/2020   ESRD (end stage renal disease) (HCC) 04/04/2020   Pneumonia due to COVID-19 virus 04/03/2020   AKI (acute kidney injury) (HCC) 04/03/2020   Elevated troponin 04/03/2020   Acquired trigger finger 06/08/2019   Lymphedema 06/08/2019   Anemia 04/17/2019   Swelling of left lower extremity 01/10/2019   Facial droop 04/09/2018   Daytime somnolence 03/30/2018   Carotid artery disease (HCC) 02/03/2018   Intracranial vascular stenosis 09/12/2017   Cough 01/20/2017   Bell's palsy 11/10/2016   History of CVA (cerebrovascular accident) 11/10/2016   Benign localized hyperplasia of prostate with urinary obstruction 10/27/2016  History of nephrolithiasis 10/27/2016   TIA (transient ischemic attack) 10/20/2016   Near syncope 06/23/2016   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   Neuropathy 08/06/2015   Health care maintenance 08/06/2015   Essential hypertension 08/06/2015   Heme positive stool 10/10/2013   Hypothyroidism 10/10/2013   Microalbuminuria 10/10/2013   Hyperlipidemia 10/10/2013   B12 deficiency 10/10/2013   Diabetes (HCC) 07/04/2013   Environmental allergies 07/04/2013   ONSET DATE: 2-3 years  REFERRING DIAG:  Neuropathy  THERAPY DIAG:  Muscle weakness (generalized)  Unsteadiness on feet  Abnormality of gait and mobility  Rationale for Evaluation and Treatment Rehabilitation  SUBJECTIVE:                                                                                                                                                                                             SUBJECTIVE STATEMENT: Patient has returned from trip. Has not been seen in a month. Arrived late due to sleeping through alarm.    Pt accompanied by: self    PERTINENT HISTORY: Patient presents to physical therapy for neuropathy. He underwent banding of his L UE fistula on 05/13/22 and scheduled for additional banding on 06/07/22. 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 Patient reports his balance is very unsteady due to neuropathy in feet, feels weak in LE's.   PAIN:  Denies pain, but reports stiffness   PRECAUTIONS: Fall  WEIGHT BEARING RESTRICTIONS Yes no lifting >5 lb in arm   FALLS: Has patient fallen in last 6 months? No  PLOF: Independent  PATIENT GOALS to be more steady and walk as normally as possible.   OBJECTIVE:   TODAY'S TREATMENT:   Unless otherwise stated, CGA was provided and gait belt donned in order to ensure pt safety  TherEx:  Nustep: Interval training: cues for keeping Spm >60; seat position 9 Lvl 2 1 minute Lvl 6 1 minute Repeat x 3 trials  3lb ankle weight:  -450 ft with CGA ambulation with cues for foot clearance. LLE has limited eccentric control -lateral step over hurdle with 3lb ankle weights with UE support at bar 10x each side   Seated:  3lb ankle weight:  -LAQ 12x each LE 3 seconds  -heel raise 20x  -ball between knees: ER 10x each LE   Seated: Adduction ball squeeze 5 second holds  10x  LAQ with ball between feet 10x  Posterior pelvic tilt in seated position 10x   Neuro Re-ed:  Airex:  Activity Description: lateral step to pod  and tap ; 3lb ankle weights added for 2nd and 3rd  Activity Setting:  The Blaze Pod Random setting was chosen to enhance cognitive processing and agility, providing an unpredictable environment to simulate real-world scenarios, and fostering quick reactions and adaptability.   Number of Pods:  6 Cycles/Sets:  3 Duration (Time or Hit Count):  10    PATIENT EDUCATION: Education details: Pt educated throughout session about proper posture and technique with exercises. Improved exercise technique, movement at target joints, use of target muscles after min to mod verbal, visual, tactile cues.  Person educated: Patient Education method: Explanation, Demonstration, Tactile cues, and Verbal cues Education comprehension: verbalized understanding, returned demonstration, verbal cues required, and tactile cues required  HOME EXERCISE PROGRAM: Access Code: [email or web address] URL: [email or web address] Date: 03/10/2023 Prepared by: Precious Bard Exercises - Seated Heel Toe Raises  - 1 x daily - 7 x weekly - 2 sets - 10 reps - 5 hold - Standing Tandem Balance with Counter Support  - 1 x daily - 7 x weekly - 2 sets - 2 reps - 30 hold - Standing March with Counter Support  - 1 x daily - 7 x weekly - 2 sets - 10 reps - 5 hold - Ankle Inversion Eversion Towel Slide  - 1 x daily - 7 x weekly - 1 sets - 15 reps - 5 hold - Standing Hip Flexor Stretch  - 1 x daily - 7 x weekly - 2 sets - 1 reps - 60 hold - Ankle Rotations in Sitting  - 1 x daily - 7 x weekly - 1 sets - 20 reps - 5 hold - Supine Piriformis Stretch with Leg Straight  - 1 x daily - 7 x weekly - 2 sets - 2 reps - 60 hold   GOALS: Goals reviewed with patient? Yes   SHORT TERM GOALS: Target date: 07/01/2022  Patient will be independent in home exercise program to improve strength/mobility for  better functional independence with ADLs. Baseline:10/9; HEP given 2/21: HEP compliant  Goal status: MET   LONG TERM GOALS: Target date:04/28/2023    Patient will increase FOTO score to equal to or greater than   63%  to demonstrate statistically significant improvement in mobility and quality of life.  Baseline: 10/9: 53% 11/29: 58% 12/27: 51% 1/17: 56% 2/21: 53% 3/18: 60%  4/8 58%; 6/26: 60% Goal status: IN PROGRESS  2.  Patient (< 56 years old) will complete five times sit to stand test in < 10 seconds without UE support indicating an increased LE strength and improved balance. Baseline: 10/9: 19.6 seconds with walking stick; one LOB 11/29: 20.56 seconds 12/27: 13.4 seconds hands on knees 1/17: 13 seconds hands on knees  2/21: 14 seconds no hands 3/18: 12 seconds no hands. 4/8 11.3 sec UE on knees. 5/13: 10.94 sec pushing from thighs. 6/24: 10.08 sec, pushing from thighs  Goal status: IN PROGRESS  3.  Patient will increase Berg Balance score by > 45/56 to demonstrate decreased fall risk during functional activities. Baseline: 10/9: 33/56 11/29: 57/84 69/62: 95/28 4/13:  24/40 2/21: 46/56 3/18: 48/56 Goal status: MET  +4.  Patient will increase 10 meter walk test to >1.71m/s as to improve gait speed for better community ambulation and to reduce fall risk. Baseline: 11/29: 1.19 m/s Goal status: MET  5.  Patient will increase ABC scale score >80% to demonstrate better functional mobility and better confidence with ADLs.  Baseline: 10/9: 67% 11/29: 75.6% 12/27: 78%  2/21: 78% 3/18:  80% Goal status: MET  6. Patient will tolerate 5 seconds of single leg stance without loss of balance to improve ability to get in and out of shower safely. Baseline: 11/29: unable to stand on single leg safely 12/27: 2 seconds 1/17: 3 seconds 2/21: 3 seconds 3/18: 4 seconds. 4/8: 3 sec  5/13: LLE x 3 sec and RLE x 4 sec; 6/26:  Goal status: IN PROGRESS   6. Patient will increase six minute walk test distance  to >1000 for progression to community ambulator and improve gait ability Baseline: 12/27: 845 ft with with walking stick 1/17: 776ft 2/21: 930 ft  3/18: 950 ft 4/8: 1134ft. 5/13: 1185 Goal status: met  7. Patient will increase dynamic gait index score to >19/24 as to demonstrate reduced fall risk and improved dynamic gait balance for better safety with community/home ambulation.  Baseline: 3/18:  9/24. 4/8: 16/24 5/13: 18/24 Goal status: in Progress  8. Patient will increase six minute walk test distance to >1500 for progression to community ambulator and improve gait ability Baseline: . 5/13: 1185; 6/26: 1145 with good gait mechanics  Goal status: IN PROGRESS    ASSESSMENT: CLINICAL IMPRESSION:  Patient has poor eccentric control of LLE requiring frequent external cues for clearance of foot with walking. Patient is fatigued this session requiring intermittent rest breaks but remains highly motivated despite fatigue. Adduction is challenging and will be an area of continued focus for strengthening. The pt will continue to benefit from skilled PT to make progress in BLE strength, balance, and coordination to return to PLOF and improve QoL.    OBJECTIVE IMPAIRMENTS Abnormal gait, decreased activity tolerance, decreased balance, decreased coordination, decreased endurance, decreased mobility, difficulty walking, decreased strength, impaired flexibility, impaired sensation, improper body mechanics, and pain.   ACTIVITY LIMITATIONS carrying, lifting, bending, standing, squatting, sleeping, stairs, transfers, bed mobility, bathing, toileting, dressing, locomotion level, and caring for others  PARTICIPATION LIMITATIONS: meal prep, cleaning, laundry, medication management, personal finances, interpersonal relationship, driving, shopping, community activity, and yard work  PERSONAL FACTORS Age, Fitness, Past/current experiences, Time since onset of injury/illness/exacerbation, Transportation, and 3+  comorbidities: CAD, cryptogenic stroke (2021), deafness in R ear, diabetic retinopathy, dialysis, ESRD, COVID, HTN, hypothyroidism, leukocytosis, nephrolithiasis, neuropathy, Pseudotumor cerebri 1986, sepsis, DM type I, DM type II  are also affecting patient's functional outcome.   REHAB POTENTIAL: Good  CLINICAL DECISION MAKING: Evolving/moderate complexity  EVALUATION COMPLEXITY: Moderate  PLAN: PT FREQUENCY: 2x/week  PT DURATION: 12 weeks  PLANNED INTERVENTIONS: Therapeutic exercises, Therapeutic activity, Neuromuscular re-education, Balance training, Gait training, Patient/Family education, Self Care, Joint mobilization, Stair training, Vestibular training, Canalith repositioning, Visual/preceptual remediation/compensation, DME instructions, Dry Needling, Cognitive remediation, Spinal mobilization, Cryotherapy, Moist heat, Manual lymph drainage, Compression bandaging, Taping, Vasopneumatic device, Ultrasound, Manual therapy, and Re-evaluation  PLAN FOR NEXT SESSION:   Follow up re: ankle stab.; calf strength, uneven surfaces; pt reports feeling weakness in hips lately, revisit flexor and glute strengthening    Precious Bard PT ,DPT Physical Therapist- [place]  [name]

## 2023-04-09 ENCOUNTER — Ambulatory Visit: Payer: Medicare Other

## 2023-04-09 DIAGNOSIS — M6281 Muscle weakness (generalized): Secondary | ICD-10-CM

## 2023-04-09 DIAGNOSIS — R2681 Unsteadiness on feet: Secondary | ICD-10-CM

## 2023-04-09 DIAGNOSIS — R269 Unspecified abnormalities of gait and mobility: Secondary | ICD-10-CM

## 2023-04-09 DIAGNOSIS — R278 Other lack of coordination: Secondary | ICD-10-CM

## 2023-04-09 NOTE — Therapy (Unsigned)
OCCUPATIONAL THERAPY NEURO TREATMENT NOTE  Patient Name: Gregory Crane MRN: [id number] DOB:05/07/1971, 52 y.o., male  PCP: Dale [place] REFERRING PROVIDER: Dale [place]  END OF SESSION:          Past Medical History:  Diagnosis Date   Allergy    Anemia    Bell's palsy    Diabetes mellitus without complication (HCC)    diet controlled   Hypertension    Hypothyroidism    Kidney stones    Pseudotumor cerebri    Stroke [facility])    Past Surgical History:  Procedure Laterality Date   COLONOSCOPY WITH PROPOFOL N/A 03/30/2020   Procedure: COLONOSCOPY WITH PROPOFOL;  Surgeon: Regis Bill, MD;  Location: [facility] ENDOSCOPY;  Service: Endoscopy;  Laterality: N/A;   LOOP RECORDER INSERTION N/A 01/27/2018   Procedure: LOOP RECORDER INSERTION;  Surgeon: Duke Salvia, MD;  Location: [facility] INVASIVE CV LAB;  Service: Cardiovascular;  Laterality: N/A;   LUMBAR PUNCTURE     as child   NO PAST SURGERIES     TEE WITHOUT CARDIOVERSION N/A 01/07/2018   Procedure: TRANSESOPHAGEAL ECHOCARDIOGRAM (TEE);  Surgeon: Antonieta Iba, MD;  Location: [facility] ORS;  Service: Cardiovascular;  Laterality: N/A;   Patient Active Problem List   Diagnosis Date Noted   Unsteady gait 10/05/2022   Steal syndrome of dialysis vascular access (HCC) 05/28/2022   Hydronephrosis, left 05/04/2022   Type II diabetes mellitus with renal manifestations (HCC) 05/04/2022   Leukocytosis 05/04/2022   Pre-op evaluation 04/24/2022   Chest pain 01/13/2022   Diabetic retinopathy associated with diabetes mellitus due to underlying condition (HCC) 01/13/2022   ESRD on hemodialysis (HCC) 01/13/2022   Deafness in right ear 08/11/2021   Headache 07/07/2021   Hearing loss 07/07/2021   Open wound 01/25/2021   History of colon polyps 10/15/2020   Postoperative hemorrhage involving digestive system following digestive system procedure 09/19/2020   Acute cholecystitis without calculus 09/09/2020   Type 2 diabetes mellitus,  with long-term current use of insulin (HCC) 09/09/2020   Cryptogenic stroke (HCC) 07/13/2020   History of loop recorder 07/13/2020   Weakness 06/18/2020   History of 2019 novel coronavirus disease (COVID-19) 05/20/2020   ESRD (end stage renal disease) (HCC) 04/04/2020   Pneumonia due to COVID-19 virus 04/03/2020   AKI (acute kidney injury) (HCC) 04/03/2020   Elevated troponin 04/03/2020   Acquired trigger finger 06/08/2019   Lymphedema 06/08/2019   Anemia 04/17/2019   Swelling of left lower extremity 01/10/2019   Facial droop 04/09/2018   Daytime somnolence 03/30/2018   Carotid artery disease (HCC) 02/03/2018   Intracranial vascular stenosis 09/12/2017   Cough 01/20/2017   Bell's palsy 11/10/2016   History of CVA (cerebrovascular accident) 11/10/2016   Benign localized hyperplasia of prostate with urinary obstruction 10/27/2016   History of nephrolithiasis 10/27/2016   TIA (transient ischemic attack) 10/20/2016   Near syncope 06/23/2016   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   Neuropathy 08/06/2015   Health care maintenance 08/06/2015   Essential hypertension 08/06/2015   Heme positive stool 10/10/2013   Hypothyroidism 10/10/2013   Microalbuminuria 10/10/2013   Hyperlipidemia 10/10/2013   B12 deficiency 10/10/2013   Diabetes (HCC) 07/04/2013   Environmental allergies 07/04/2013   ONSET DATE: May 2023  REFERRING DIAG: CVA  THERAPY DIAG:  No diagnosis found.  Rationale for Evaluation and Treatment: Rehabilitation  SUBJECTIVE:  SUBJECTIVE STATEMENT: No pain, just fatigue  Pt reports that he has been sick the past few weeks but is finally better.   Pt accompanied  by: self  PERTINENT HISTORY: Patient with history of CVA, banding of fistula in left arm with worsening of LUE functional use over time since May 2023, he has been seeing PT for balance deficits.  PMH includes CAD, cryptogenic stroke (2021), deafness in R ear, diabetic retinopathy, dialysis, ESRD, COVID, HTN,  hypothyroidism, leukocytosis, nephrolithiasis, neuropathy, Pseudotumor cerebri 1986, sepsis, DM type I, DM type II.    PRECAUTIONS: Fall and Other: No strengthening exercises greater than 5-10 pounds in left arm.   Dialysis Tues, Th, Sat.  WEIGHT BEARING RESTRICTIONS: No  PAIN:  Are you having pain? No  FALLS: Has patient fallen in last 6 months? No  LIVING ENVIRONMENT: Lives with: alone Lives in: House/apartment Stairs: Yes: Internal: 15 steps; on right going up Has following equipment at home: Single point cane and Walker - 2 wheeled  PLOF: Independent  PATIENT GOALS: Pt reports he wants to have better use of his left hand, be able to do buttons and shoes.   OBJECTIVE:   HAND DOMINANCE: Right  ADLs: Overall ADLs: modified independent with most tasks Transfers/ambulation related to ADLs: Now walking without an assistive device, used cane in the past.  Eating: Independent with self feeding, difficulty with cutting food, requires increased focus and effort. Grooming: Independent UB Dressing: Difficulty with buttons, uses pullovers most of the time  LB Dressing: Difficulty with tying shoes Toileting: Independent Bathing: Independent Tub Shower transfers: Independent Equipment: none  IADLs: Shopping: independent,  uses a cart to push in the store Light housekeeping: modified independent Meal Prep: difficulty with tasks requiring use of left hand, bilateral hand use.   Community mobility: Ambulates without use of assistive device, able to drive Medication management: Independent Financial management: Independent Handwriting: 90% legible  MOBILITY STATUS: Needs Assist: Pt has progressed with PT, used to use a cane but ambulating without an assistive device but still has balance difficulties.    POSTURE COMMENTS:    Sitting balance: Sits without UE support up to 30 sec  ACTIVITY TOLERANCE: Activity tolerance: limited activity tolerance  FUNCTIONAL OUTCOME  MEASURES: FOTO: 53 12/11/22: FOTO: 55 01/15/23: 56  UPPER EXTREMITY ROM:    Active ROM Right eval Left eval  Shoulder flexion [facility]  Shoulder abduction [facility]  Shoulder adduction [facility]  Shoulder extension [facility]  Shoulder internal rotation [facility]  Shoulder external rotation [facility]  Elbow flexion [facility] WFL  Elbow extension [facility]  Wrist flexion [facility] WFL  Wrist extension [facility] WFL  Wrist ulnar deviation [facility] WFL  Wrist radial deviation [facility] WFL  Wrist pronation [facility] WFL  Wrist supination WFL WFL  (Blank rows = not tested)  UPPER EXTREMITY MMT:     MMT Right eval Left eval Left 01/15/23 Left 04/09/23  Shoulder flexion 5/5 3+/5 4+ 5  Shoulder abduction 5/5 3+/5 4+ 5  Shoulder adduction      Shoulder extension      Shoulder internal rotation      Shoulder external rotation      Middle trapezius      Lower trapezius      Elbow flexion 5/5 3+/5 5 5   Elbow extension 5/5 3+/5 4+ 5  Wrist flexion 5/5 3+/5 4+ 5  Wrist extension 5/5 3+/5 4+ 5  Wrist ulnar deviation 5/5 3+/5 4+ 5  Wrist radial deviation 5/5 3+/5 4+ 5  Wrist pronation 5/5 3+/5 4+ 5  Wrist supination 5/5 3+/5 4+ 5  (Blank rows = not tested)  Opposition of thumb to all  digits on right, on left hand opposition to ring finger but cannot demonstrate to small finger.   HAND FUNCTION: Grip strength: Right: 44 lbs; Left: 35 lbs, Lateral pinch: Right: 17 lbs, Left: 16 lbs, 3 point pinch: Right: 14 lbs, Left: 0 lbs, and Tip pinch: Right 2 lbs, Left: 0 lbs 12/23/2022: Grip strength: Right: 50 lbs; Left: 43 lbs, Lateral pinch: Right: 10 lbs, Left: 8 lbs, 3 point pinch: Right:  9 lbs, Left: 8 lbs, and Tip pinch: Right 8 lbs, Left: 4 lbs 01/15/23: R grip 45 lbs, Left: 34 lbs; R lateral pinch 10 lbs, L 7 lbs; 3 point pinch R: 7 lbs, L 7 lbs; tip pinch R: 8 lbs, L 4 lbs (At dialysis yesterday, pt reports they pulled a lot of fluid and pt was so tired that he almost got sick) 02/17/2023: Grip strength: Right: 50 lbs; Left: 45   lbs, Lateral pinch: Right: 10  lbs, Left: 17 lbs, 3 point pinch: Right: 9  lbs, Left: 10 lbs, and Tip pinch: Right  8 lbs, Left: 5 lbs 03/05/2023: Grip strength: Right: 50 lbs; Left: 41  lbs, Lateral pinch: Right: 10 lbs, Left: 10 lbs, 3 point pinch: Right: 9  lbs, Left: 7 lbs, and Tip pinch: Right  8 lbs, Left: 7 lbs 04/09/2023: Grip strength: Right: 42 lbs; Left: 35  lbs, Lateral pinch: Right: 10 lbs, Left: 6 lbs, 3 point pinch: Right: 7  lbs, Left: 4 lbs, and Tip pinch: Right  8 lbs, Left: 5 lbs  COORDINATION: 9 Hole Peg test: Right: 36 sec; Left: 56 sec 12/23/2022: 9 Hole Peg test: Right: 32 sec; Left: 45 sec 01/15/23: R hand 26 sec, Left: 43 sec 02/17/23: R hand 39 sec, Left: 53 sec 03/05/23: R hand 36 sec, Left: 50 sec 04/09/23: R hand 38 sec, Left: 45 sec  TODAY'S TREATMENT: 04/07/2023     Therapeutic Exercise:  Pt. performed gross gripping with a gross grip strengthener. Pt. worked on sustaining grip while grasping pegs and reaching at various heights. The gripper was set to 17.9 # of grip strength resistance x's 3 trials. Pt. worked on grasping 1" resistive cubes alternating thumb opposition to the tip of the 2nd-3th digit while the board is placed at a horizontal and vertical angle. Pt. worked on pressing the cubes back into place while alternating isolated thumb and 2nd digit extension. Cues to avoid R shoulder hiking while reaching with elbow in full extension to remove cubes from vertically angled board. Pt. Worked on pinch strengthening in the left hand for lateral, and 3pt. pinch using yellow, red, green, and blue resistive clips. Pt. worked on placing the clips at various vertical and horizontal angles. Tactile and verbal cues were required for eliciting the desired movement. Utilizes 3 point pinch for yellow, red, and green clips, requires lateral key pinch to complete blue and black clips.     Therapeutic Activity: Pt worked on grasping washers of various sizes from magnetic bowl  and threaded onto vertical dowel. Requires use edge of bowl to grasp coins, unable to grasp from flat surface. Increased difficulty threading washers onto string, glasses donned to complete.      PATIENT EDUCATION: Education details: intrinsic strengthening for L hand Person educated: Patient Education method: Explanation, demo, written handout Education comprehension: verbalized understanding, min vc, tactile cues  HOME EXERCISE PROGRAM: Theraputty, tendon glides, intrinsic strengthening with digit abd/add and lumbrical isometric strengthening.  GOALS: Goals reviewed with patient? Yes  SHORT TERM GOALS: Target date:  02/26/2023   Pt will demonstrate ability to hold and utilize utensils for cutting meat with modified independence. Baseline: difficulty with cutting, stabilizing item with fork for cutting; 01/15/23: pt reports he can cut meat, but it's awkward Goal status: MET  2.  Pt will demonstrate ability to obtain keys from left pocket without difficult and without dropping.   Baseline: pt struggles with reaching into left pocket to get keys; 5/22: still challenging but can do it without dropping Goal status: MET   LONG TERM GOALS: Target date: 04/09/2023   Pt will demonstrate HEP with modified independence.   Baseline: no current program for left UE. (Has green tband but needs educ on exercises); 01/15/23: using theraputty and has recently begun to focus on L hand intrinsic strengthening; 03/05/23 continue to update HEP as he progresses Goal status: IN PROGRESS  2.  Pt will improve left UE coordination by 10 secs on 9 hole peg test to demonstrate shoe tying with modified independence.  Baseline: currently wearing slip on shoes and unable to tie shoes, left 9 hole peg test in 56 sec at eval; 01/15/23: pt reports he can tie shoes, sometimes requires multiple attempts, but can do it.  Often chooses to wear slip ons as this is still challenging; L 9 hole 43 sec; 8/14: slip ons, extra  time to tie Goal status: IN PROGRESS/ partially met  3.  Pt will demonstrate improved coordination in left hand to demonstrate buttoning buttons on clothing with modified independence.  Baseline: difficulty with buttons and has been wearing more pullovers to avoid challenge of buttons; 01/15/23: pt reports still challenging Goal status: MET  4.  Pt will demonstrate improvement in left grip strength by 5# to open jars and containers with modified independence and greater ease.   Baseline: Difficulty with opening jars and containers with use of left hand at eval; 01/15/23: pt reports easier time opening a water bottle top without dicem, but uses dycem to open jars (increased L hand weakness as compared to last progress update, but pt reports increased weakness this week from having excess fluid pulled off him during dialysis and would like to re-measure on a Monday after pt has had a couple days off of dialysis); 04/09/23: uses dycem which helps a great deal Goal status: IN PROGRESS  5.  Pt will improve left UE strength by 1 mm grade to hold and hand wash dishes with modified independence.  Baseline: difficulty with being able to hand wash dishes at eval, difficulty stabilizing dish; 01/15/23: easier with dishes, harder with a heavy pan Goal status: MET  6.  Pt will demonstrate FOTO score of 59 or greater to show a clinically relevant change in LUE to impact greater independence with daily ADL and IADL tasks at home and in the community.   Baseline: Eval: 53; 12/11/22: 55; 01/15/23: 56 03/05/23: score of 60 Goal status: MET  ASSESSMENT:  CLINICAL IMPRESSION:  Pt tolerated 17.9# grip strengthener for 3 trials, dropped only 1 or 2 pegs each trial while sustaining grip to reach at shoulder height. Cues to minimize R shoulder hiking while removing 1 inch resistive cubes from shoulder height board. Mild numbness in L fingertips and baseline vision deficits impacting ability to thread washer on string.  Utilizes 3 point pinch for yellow, red, and green clips, requires lateral key pinch to complete blue and black clips. Pt. continues to present with left hand intrinsic weakness, and requires verbal, and tactile cues for form and technique during the exercises.  Pt continues to benefit from skilled OT services to work towards goals in plan of care to improve left UE function for daily tasks.    PERFORMANCE DEFICITS: in functional skills including ADLs, IADLs, coordination, dexterity, sensation, tone, ROM, strength, flexibility, Fine motor control, balance, endurance, decreased knowledge of use of DME, and UE functional use, cognitive skills including, and psychosocial skills including environmental adaptation, habits, and routines and behaviors.   IMPAIRMENTS: are limiting patient from ADLs, IADLs, and social participation.   CO-MORBIDITIES: has co-morbidities such as CAD, ESRD with dialysis, deafness in right ear, diabetic retinopathy, HTN, neuropathy  that affects occupational performance. Patient will benefit from skilled OT to address above impairments and improve overall function.  MODIFICATION OR ASSISTANCE TO COMPLETE EVALUATION: Min-Moderate modification of tasks or assist with assess necessary to complete an evaluation.  OT OCCUPATIONAL PROFILE AND HISTORY: Comprehensive assessment: Review of records and extensive additional review of physical, cognitive, psychosocial history related to current functional performance.  CLINICAL DECISION MAKING: Moderate - several treatment options, min-mod task modification necessary  REHAB POTENTIAL: Good  EVALUATION COMPLEXITY: Moderate PLAN:  OT FREQUENCY: 2x/week  OT DURATION: 12 weeks  PLANNED INTERVENTIONS: self care/ADL training, therapeutic exercise, therapeutic activity, neuromuscular re-education, manual therapy, balance training, paraffin, moist heat, contrast bath, patient/family education, and DME and/or AE instructions  RECOMMENDED OTHER  SERVICES: Pt is being seen by PT currently.   CONSULTED AND AGREED WITH PLAN OF CARE: Patient  PLAN FOR NEXT SESSION: grip, pinch and coordination skills.   Kathie Dike, M.S. OTR/L  04/09/23, 11:03 AM  ascom [phone]

## 2023-04-10 ENCOUNTER — Ambulatory Visit: Payer: Medicare Other

## 2023-04-14 ENCOUNTER — Ambulatory Visit: Payer: Medicare Other | Admitting: Physical Therapy

## 2023-04-14 ENCOUNTER — Ambulatory Visit: Payer: Medicare Other

## 2023-04-14 DIAGNOSIS — R269 Unspecified abnormalities of gait and mobility: Secondary | ICD-10-CM

## 2023-04-14 DIAGNOSIS — R262 Difficulty in walking, not elsewhere classified: Secondary | ICD-10-CM

## 2023-04-14 DIAGNOSIS — R278 Other lack of coordination: Secondary | ICD-10-CM

## 2023-04-14 DIAGNOSIS — R2681 Unsteadiness on feet: Secondary | ICD-10-CM

## 2023-04-14 DIAGNOSIS — M6281 Muscle weakness (generalized): Secondary | ICD-10-CM

## 2023-04-14 DIAGNOSIS — R2689 Other abnormalities of gait and mobility: Secondary | ICD-10-CM

## 2023-04-14 NOTE — Therapy (Signed)
OUTPATIENT PHYSICAL THERAPY NEURO TREATMENT   Patient Name: Gregory Crane MRN: [id number] DOB:31-Oct-1970, 52 y.o., male Today's Date: 04/14/2023  PCP: Dale [place] MD REFERRING PROVIDER: Dale  MD  PT End of Session - 04/14/23 0810     Visit Number 67    Number of Visits 80    Date for PT Re-Evaluation 04/28/23    Authorization Type Medicare    Authorization Time Period 11/11/22-02/03/23    Authorization - Number of Visits 12    Progress Note Due on Visit 60    PT Start Time 0806    PT Stop Time 0845    PT Time Calculation (min) 39 min    Equipment Utilized During Treatment Gait belt    Activity Tolerance Patient tolerated treatment well;No increased pain    Behavior During Therapy WFL for tasks assessed/performed                   Past Medical History:  Diagnosis Date   Allergy    Anemia    Bell's palsy    Diabetes mellitus without complication (HCC)    diet controlled   Hypertension    Hypothyroidism    Kidney stones    Pseudotumor cerebri    Stroke [facility])    Past Surgical History:  Procedure Laterality Date   COLONOSCOPY WITH PROPOFOL N/A 03/30/2020   Procedure: COLONOSCOPY WITH PROPOFOL;  Surgeon: Regis Bill, MD;  Location: [facility] ENDOSCOPY;  Service: Endoscopy;  Laterality: N/A;   LOOP RECORDER INSERTION N/A 01/27/2018   Procedure: LOOP RECORDER INSERTION;  Surgeon: Duke Salvia, MD;  Location: [facility] INVASIVE CV LAB;  Service: Cardiovascular;  Laterality: N/A;   LUMBAR PUNCTURE     as child   NO PAST SURGERIES     TEE WITHOUT CARDIOVERSION N/A 01/07/2018   Procedure: TRANSESOPHAGEAL ECHOCARDIOGRAM (TEE);  Surgeon: Antonieta Iba, MD;  Location: [facility] ORS;  Service: Cardiovascular;  Laterality: N/A;   Patient Active Problem List   Diagnosis Date Noted   Unsteady gait 10/05/2022   Steal syndrome of dialysis vascular access (HCC) 05/28/2022   Hydronephrosis, left 05/04/2022   Type II diabetes mellitus with renal manifestations  (HCC) 05/04/2022   Leukocytosis 05/04/2022   Pre-op evaluation 04/24/2022   Chest pain 01/13/2022   Diabetic retinopathy associated with diabetes mellitus due to underlying condition (HCC) 01/13/2022   ESRD on hemodialysis (HCC) 01/13/2022   Deafness in right ear 08/11/2021   Headache 07/07/2021   Hearing loss 07/07/2021   Open wound 01/25/2021   History of colon polyps 10/15/2020   Postoperative hemorrhage involving digestive system following digestive system procedure 09/19/2020   Acute cholecystitis without calculus 09/09/2020   Type 2 diabetes mellitus, with long-term current use of insulin (HCC) 09/09/2020   Cryptogenic stroke (HCC) 07/13/2020   History of loop recorder 07/13/2020   Weakness 06/18/2020   History of 2019 novel coronavirus disease (COVID-19) 05/20/2020   ESRD (end stage renal disease) (HCC) 04/04/2020   Pneumonia due to COVID-19 virus 04/03/2020   AKI (acute kidney injury) (HCC) 04/03/2020   Elevated troponin 04/03/2020   Acquired trigger finger 06/08/2019   Lymphedema 06/08/2019   Anemia 04/17/2019   Swelling of left lower extremity 01/10/2019   Facial droop 04/09/2018   Daytime somnolence 03/30/2018   Carotid artery disease (HCC) 02/03/2018   Intracranial vascular stenosis 09/12/2017   Cough 01/20/2017   Bell's palsy 11/10/2016   History of CVA (cerebrovascular accident) 11/10/2016   Benign localized hyperplasia of prostate with urinary obstruction  10/27/2016   History of nephrolithiasis 10/27/2016   TIA (transient ischemic attack) 10/20/2016   Near syncope 06/23/2016   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   Neuropathy 08/06/2015   Health care maintenance 08/06/2015   Essential hypertension 08/06/2015   Heme positive stool 10/10/2013   Hypothyroidism 10/10/2013   Microalbuminuria 10/10/2013   Hyperlipidemia 10/10/2013   B12 deficiency 10/10/2013   Diabetes (HCC) 07/04/2013   Environmental allergies 07/04/2013   ONSET DATE: 2-3 years  REFERRING DIAG:  Neuropathy  THERAPY DIAG:  Muscle weakness (generalized)  Other lack of coordination  Unsteadiness on feet  Abnormality of gait and mobility  Difficulty in walking, not elsewhere classified  Other abnormalities of gait and mobility  Rationale for Evaluation and Treatment Rehabilitation  SUBJECTIVE:                                                                                                                                                                                             SUBJECTIVE STATEMENT: Patient reports that he had a hard time getting to sleep last night, was up until 4:30AM, so he is a little tired for an early morning  PT session. Pt also states that he is a little "down" because it is the 3rd anniversary of wife's passing.     Pt accompanied by: self    PERTINENT HISTORY: Patient presents to physical therapy for neuropathy. He underwent banding of his L UE fistula on 05/13/22 and scheduled for additional banding on 06/07/22. 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 Patient reports his balance is very unsteady due to neuropathy in feet, feels weak in LE's.   PAIN:  Denies pain, but reports stiffness   PRECAUTIONS: Fall  WEIGHT BEARING RESTRICTIONS Yes no lifting >5 lb in arm   FALLS: Has patient fallen in last 6 months? No  PLOF: Independent  PATIENT GOALS to be more steady and walk as normally as possible.   OBJECTIVE:   TODAY'S TREATMENT:   Unless otherwise stated, CGA was provided and gait belt donned in order to ensure pt safety  TherEx:  Nustep: Interval training: cues for keeping Spm >60; seat position 9 Lvl 2 x 4 min   Seated  hip abduction RTB x15 Hip flexion RTB x15 Ankle PF RTB x 20 Ankle eversion/inversion AROM x 15  Hip adduction x 12 bil    Gait through hospital and over unlevel cement surface at [facility] [place]. Performed 2 bouts 834ft with cues for improved step height on the RLE to  prevent  foot drag and management of unlevel cracks in sidewalk   Sit<>stand from arms chair with UE support x from arm rests 2 x 10 with therapeutic rest break between bouts.   Throughout session PT monitored pt to assess need for therapeutic rest breaks due to BLE and cardiovascular fatigue   CGA for safety with gait on unlevel surface and cues for step length and supervision assist for transfers.    PATIENT EDUCATION: Education details: Pt educated throughout session about proper posture and technique with exercises. Improved exercise technique, movement at target joints, use of target muscles after min to mod verbal, visual, tactile cues.  Person educated: Patient Education method: Explanation, Demonstration, Tactile cues, and Verbal cues Education comprehension: verbalized understanding, returned demonstration, verbal cues required, and tactile cues required  HOME EXERCISE PROGRAM: Access Code: [email or web address] URL: [email or web address] Date: 03/10/2023 Prepared by: Precious Bard Exercises - Seated Heel Toe Raises  - 1 x daily - 7 x weekly - 2 sets - 10 reps - 5 hold - Standing Tandem Balance with Counter Support  - 1 x daily - 7 x weekly - 2 sets - 2 reps - 30 hold - Standing March with Counter Support  - 1 x daily - 7 x weekly - 2 sets - 10 reps - 5 hold - Ankle Inversion Eversion Towel Slide  - 1 x daily - 7 x weekly - 1 sets - 15 reps - 5 hold - Standing Hip Flexor Stretch  - 1 x daily - 7 x weekly - 2 sets - 1 reps - 60 hold - Ankle Rotations in Sitting  - 1 x daily - 7 x weekly - 1 sets - 20 reps - 5 hold - Supine Piriformis Stretch with Leg Straight  - 1 x daily - 7 x weekly - 2 sets - 2 reps - 60 hold   GOALS: Goals reviewed with patient? Yes   SHORT TERM GOALS: Target date: 07/01/2022  Patient will be independent in home  exercise program to improve strength/mobility for better functional independence with ADLs. Baseline:10/9; HEP given 2/21: HEP compliant  Goal status: MET   LONG TERM GOALS: Target date:04/28/2023    Patient will increase FOTO score to equal to or greater than   63%  to demonstrate statistically significant improvement in mobility and quality of life.  Baseline: 10/9: 53% 11/29: 58% 12/27: 51% 1/17: 56% 2/21: 53% 3/18: 60%  4/8 58%; 6/26: 60% Goal status: IN PROGRESS  2.  Patient (< 81 years old) will complete five times sit to stand test in < 10 seconds without UE support indicating an increased LE strength and improved balance. Baseline: 10/9: 19.6 seconds with walking stick; one LOB 11/29: 20.56 seconds 12/27: 13.4 seconds hands on knees 1/17: 13 seconds hands on knees  2/21: 14 seconds no hands 3/18: 12 seconds no hands. 4/8 11.3 sec UE on knees. 5/13: 10.94 sec pushing from thighs. 6/24: 10.08 sec, pushing from thighs  Goal status: IN PROGRESS  3.  Patient will increase Berg Balance score by > 45/56 to demonstrate decreased fall risk during functional activities. Baseline: 10/9: 33/56 11/29: 02/72 53/66: 44/03 4/74:  25/95 2/21: 46/56 3/18: 48/56 Goal status: MET  +4.  Patient will increase 10 meter walk test to >1.9m/s as to improve gait speed for better community ambulation and to reduce fall risk. Baseline: 11/29: 1.19 m/s Goal status: MET  5.  Patient will increase ABC scale score >80% to demonstrate better functional mobility and better confidence  with ADLs.  Baseline: 10/9: 67% 11/29: 75.6% 12/27: 78%  2/21: 78% 3/18: 80% Goal status: MET  6. Patient will tolerate 5 seconds of single leg stance without loss of balance to improve ability to get in and out of shower safely. Baseline: 11/29: unable to stand on single leg safely 12/27: 2 seconds 1/17: 3 seconds 2/21: 3 seconds 3/18: 4 seconds. 4/8: 3 sec  5/13: LLE x 3 sec and RLE x 4 sec; 6/26:  Goal status: IN PROGRESS   6.  Patient will increase six minute walk test distance to >1000 for progression to community ambulator and improve gait ability Baseline: 12/27: 845 ft with with walking stick 1/17: 738ft 2/21: 930 ft  3/18: 950 ft 4/8: 1168ft. 5/13: 1185 Goal status: met  7. Patient will increase dynamic gait index score to >19/24 as to demonstrate reduced fall risk and improved dynamic gait balance for better safety with community/home ambulation.  Baseline: 3/18:  9/24. 4/8: 16/24 5/13: 18/24 Goal status: in Progress  8. Patient will increase six minute walk test distance to >1500 for progression to community ambulator and improve gait ability Baseline: . 5/13: 1185; 6/26: 1145 with good gait mechanics  Goal status: IN PROGRESS    ASSESSMENT: CLINICAL IMPRESSION:  Pt presents clinic with reduced energy on this day due to poor sleep from personal matters in life. PT treatment focused on BLE strengthening and dynamic balance in functional gait training over simulated community environment. Throughout session PT employed therapeutic use of self as needed to improve participation and PT session. Noted to continue to struggle with ankle DF and Eversion as well as hip adduction on the L>R. The pt will continue to benefit from skilled PT to make progress in BLE strength, balance, and coordination to return to PLOF and improve QoL.    OBJECTIVE IMPAIRMENTS Abnormal gait, decreased activity tolerance, decreased balance, decreased coordination, decreased endurance, decreased mobility, difficulty walking, decreased strength, impaired flexibility, impaired sensation, improper body mechanics, and pain.   ACTIVITY LIMITATIONS carrying, lifting, bending, standing, squatting, sleeping, stairs, transfers, bed mobility, bathing, toileting, dressing, locomotion level, and caring for others  PARTICIPATION LIMITATIONS: meal prep, cleaning, laundry, medication management, personal finances, interpersonal relationship, driving,  shopping, community activity, and yard work  PERSONAL FACTORS Age, Fitness, Past/current experiences, Time since onset of injury/illness/exacerbation, Transportation, and 3+ comorbidities: CAD, cryptogenic stroke (2021), deafness in R ear, diabetic retinopathy, dialysis, ESRD, COVID, HTN, hypothyroidism, leukocytosis, nephrolithiasis, neuropathy, Pseudotumor cerebri 1986, sepsis, DM type I, DM type II  are also affecting patient's functional outcome.   REHAB POTENTIAL: Good  CLINICAL DECISION MAKING: Evolving/moderate complexity  EVALUATION COMPLEXITY: Moderate  PLAN: PT FREQUENCY: 2x/week  PT DURATION: 12 weeks  PLANNED INTERVENTIONS: Therapeutic exercises, Therapeutic activity, Neuromuscular re-education, Balance training, Gait training, Patient/Family education, Self Care, Joint mobilization, Stair training, Vestibular training, Canalith repositioning, Visual/preceptual remediation/compensation, DME instructions, Dry Needling, Cognitive remediation, Spinal mobilization, Cryotherapy, Moist heat, Manual lymph drainage, Compression bandaging, Taping, Vasopneumatic device, Ultrasound, Manual therapy, and Re-evaluation  PLAN FOR NEXT SESSION:   Continue to advance hip and ankle strengthening.    Golden Pop PT ,DPT Physical Therapist- [place]  [facility]

## 2023-04-14 NOTE — Therapy (Signed)
OCCUPATIONAL THERAPY NEURO RECERTIFICATION NOTE  Patient Name: Gregory Crane MRN: [id number] DOB:November 12, 1970, 52 y.o., male  PCP: Dale Ashburn REFERRING PROVIDER: Dale Rockdale  END OF SESSION:  OT End of Session - 04/14/23 0848     Visit Number 34    Number of Visits 37    Date for OT Re-Evaluation 05/09/23    Progress Note Due on Visit 10    OT Start Time [phone]    OT Stop Time 0930    OT Time Calculation (min) 38 min    Activity Tolerance Patient tolerated treatment well    Behavior During Therapy [facility] for tasks assessed/performed                Past Medical History:  Diagnosis Date   Allergy    Anemia    Bell's palsy    Diabetes mellitus without complication (HCC)    diet controlled   Hypertension    Hypothyroidism    Kidney stones    Pseudotumor cerebri    Stroke [facility])    Past Surgical History:  Procedure Laterality Date   COLONOSCOPY WITH PROPOFOL N/A 03/30/2020   Procedure: COLONOSCOPY WITH PROPOFOL;  Surgeon: Regis Bill, MD;  Location: [facility] ENDOSCOPY;  Service: Endoscopy;  Laterality: N/A;   LOOP RECORDER INSERTION N/A 01/27/2018   Procedure: LOOP RECORDER INSERTION;  Surgeon: Duke Salvia, MD;  Location: [facility] INVASIVE CV LAB;  Service: Cardiovascular;  Laterality: N/A;   LUMBAR PUNCTURE     as child   NO PAST SURGERIES     TEE WITHOUT CARDIOVERSION N/A 01/07/2018   Procedure: TRANSESOPHAGEAL ECHOCARDIOGRAM (TEE);  Surgeon: Antonieta Iba, MD;  Location: [facility] ORS;  Service: Cardiovascular;  Laterality: N/A;   Patient Active Problem List   Diagnosis Date Noted   Unsteady gait 10/05/2022   Steal syndrome of dialysis vascular access (HCC) 05/28/2022   Hydronephrosis, left 05/04/2022   Type II diabetes mellitus with renal manifestations (HCC) 05/04/2022   Leukocytosis 05/04/2022   Pre-op evaluation 04/24/2022   Chest pain 01/13/2022   Diabetic retinopathy associated with diabetes mellitus due to underlying condition (HCC) 01/13/2022    ESRD on hemodialysis (HCC) 01/13/2022   Deafness in right ear 08/11/2021   Headache 07/07/2021   Hearing loss 07/07/2021   Open wound 01/25/2021   History of colon polyps 10/15/2020   Postoperative hemorrhage involving digestive system following digestive system procedure 09/19/2020   Acute cholecystitis without calculus 09/09/2020   Type 2 diabetes mellitus, with long-term current use of insulin (HCC) 09/09/2020   Cryptogenic stroke (HCC) 07/13/2020   History of loop recorder 07/13/2020   Weakness 06/18/2020   History of 2019 novel coronavirus disease (COVID-19) 05/20/2020   ESRD (end stage renal disease) (HCC) 04/04/2020   Pneumonia due to COVID-19 virus 04/03/2020   AKI (acute kidney injury) (HCC) 04/03/2020   Elevated troponin 04/03/2020   Acquired trigger finger 06/08/2019   Lymphedema 06/08/2019   Anemia 04/17/2019   Swelling of left lower extremity 01/10/2019   Facial droop 04/09/2018   Daytime somnolence 03/30/2018   Carotid artery disease (HCC) 02/03/2018   Intracranial vascular stenosis 09/12/2017   Cough 01/20/2017   Bell's palsy 11/10/2016   History of CVA (cerebrovascular accident) 11/10/2016   Benign localized hyperplasia of prostate with urinary obstruction 10/27/2016   History of nephrolithiasis 10/27/2016   TIA (transient ischemic attack) 10/20/2016   Near syncope 06/23/2016   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   Neuropathy 08/06/2015   Health care maintenance 08/06/2015   Essential hypertension  08/06/2015   Heme positive stool 10/10/2013   Hypothyroidism 10/10/2013   Microalbuminuria 10/10/2013   Hyperlipidemia 10/10/2013   B12 deficiency 10/10/2013   Diabetes (HCC) 07/04/2013   Environmental allergies 07/04/2013   ONSET DATE: May 2023  REFERRING DIAG: CVA  THERAPY DIAG:  Muscle weakness (generalized)  Other lack of coordination  Rationale for Evaluation and Treatment: Rehabilitation  SUBJECTIVE:  SUBJECTIVE STATEMENT: Pt reports that he did not  sleep well last night.   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    PRECAUTIONS: Fall and Other: No strengthening exercises greater than 5-10 pounds in left arm.   Dialysis Tues, Th, Sat.  WEIGHT BEARING RESTRICTIONS: No  PAIN:  Are you having pain? No  FALLS: Has patient fallen in last 6 months? No  LIVING ENVIRONMENT: Lives with: alone Lives in: House/apartment Stairs: Yes: Internal: 15 steps; on right going up Has following equipment at home: Single point cane and Walker - 2 wheeled  PLOF: Independent  PATIENT GOALS: Pt reports he wants to have better use of his left hand, be able to do buttons and shoes.   OBJECTIVE:   HAND DOMINANCE: Right  ADLs: Overall ADLs: modified independent with most tasks Transfers/ambulation related to ADLs: Now walking without an assistive device, used cane in the past.  Eating: Independent with self feeding, difficulty with cutting food, requires increased focus and effort. Grooming: Independent UB Dressing: Difficulty with buttons, uses pullovers most of the time  LB Dressing: Difficulty with tying shoes Toileting: Independent Bathing: Independent Tub Shower transfers: Independent Equipment: none  IADLs: Shopping: independent,  uses a cart to push in the store Light housekeeping: modified independent Meal Prep: difficulty with tasks requiring use of left hand, bilateral hand use.   Community mobility: Ambulates without use of assistive device, able to drive Medication management: Independent Financial management: Independent Handwriting: 90%  legible  MOBILITY STATUS: Needs Assist: Pt has progressed with PT, used to use a cane but ambulating without an assistive device but still has balance difficulties.    POSTURE COMMENTS:    Sitting balance: Sits without UE support up to 30 sec  ACTIVITY TOLERANCE: Activity tolerance: limited activity tolerance  FUNCTIONAL OUTCOME MEASURES: FOTO: 53 12/11/22: FOTO: 55 01/15/23: 56 04/09/23: 60  UPPER EXTREMITY ROM:    Active ROM Right eval Left eval  Shoulder flexion [facility]  Shoulder abduction [facility]  Shoulder adduction [facility]  Shoulder extension [facility]  Shoulder internal rotation [facility]  Shoulder external rotation [facility]  Elbow flexion [facility] WFL  Elbow extension [facility]  Wrist flexion [facility] WFL  Wrist extension [facility] WFL  Wrist ulnar deviation [facility] WFL  Wrist radial deviation [facility] WFL  Wrist pronation [facility] WFL  Wrist supination WFL WFL  (Blank rows = not tested)  UPPER EXTREMITY MMT:     MMT Right eval Left eval Left 01/15/23 Left 04/09/23  Shoulder flexion 5/5 3+/5 4+ 5  Shoulder abduction 5/5 3+/5 4+ 5  Shoulder adduction      Shoulder extension      Shoulder internal rotation      Shoulder external rotation  Middle trapezius      Lower trapezius      Elbow flexion 5/5 3+/5 5 5   Elbow extension 5/5 3+/5 4+ 5  Wrist flexion 5/5 3+/5 4+ 5  Wrist extension 5/5 3+/5 4+ 5  Wrist ulnar deviation 5/5 3+/5 4+ 5  Wrist radial deviation 5/5 3+/5 4+ 5  Wrist pronation 5/5 3+/5 4+ 5  Wrist supination 5/5 3+/5 4+ 5  (Blank rows = not tested)  Opposition of thumb to all digits on right, on left hand opposition to ring finger but cannot demonstrate to small finger.   HAND FUNCTION: Grip strength: Right: 44 lbs; Left: 35 lbs, Lateral pinch: Right: 17 lbs, Left: 16 lbs, 3 point pinch: Right: 14 lbs, Left: 0 lbs, and Tip pinch: Right 2 lbs, Left: 0 lbs 12/23/2022: Grip strength: Right: 50 lbs; Left: 43 lbs, Lateral pinch: Right: 10 lbs, Left: 8 lbs, 3 point pinch: Right:   9 lbs, Left: 8 lbs, and Tip pinch: Right 8 lbs, Left: 4 lbs 01/15/23: R grip 45 lbs, Left: 34 lbs; R lateral pinch 10 lbs, L 7 lbs; 3 point pinch R: 7 lbs, L 7 lbs; tip pinch R: 8 lbs, L 4 lbs (At dialysis yesterday, pt reports they pulled a lot of fluid and pt was so tired that he almost got sick) 02/17/2023: Grip strength: Right: 50 lbs; Left: 45  lbs, Lateral pinch: Right: 10  lbs, Left: 17 lbs, 3 point pinch: Right: 9  lbs, Left: 10 lbs, and Tip pinch: Right  8 lbs, Left: 5 lbs 03/05/2023: Grip strength: Right: 50 lbs; Left: 41  lbs, Lateral pinch: Right: 10 lbs, Left: 10 lbs, 3 point pinch: Right: 9  lbs, Left: 7 lbs, and Tip pinch: Right  8 lbs, Left: 7 lbs 04/09/2023: Grip strength: Right: 42 lbs; Left: 35  lbs, Lateral pinch: Right: 10 lbs, Left: 6 lbs, 3 point pinch: Right: 7  lbs, Left: 4 lbs, and Tip pinch: Right  8 lbs, Left: 5 lbs  COORDINATION: 9 Hole Peg test: Right: 36 sec; Left: 56 sec 12/23/2022: 9 Hole Peg test: Right: 32 sec; Left: 45 sec 01/15/23: R hand 26 sec, Left: 43 sec 02/17/23: R hand 39 sec, Left: 53 sec 03/05/23: R hand 36 sec, Left: 50 sec 04/09/23: R hand 38 sec, Left: 45 sec  TODAY'S TREATMENT: 04/14/2023   Therapeutic Exercise: Facilitated hand strengthening with use of hand gripper set at 23.4# on the L and 28.9# on the R to remove jumbo pegs from pegboard x2 trials each hand. Pt worked on Marketing executive in the left hand for 3 pt pinch using yellow, red, green clips and lateral pinch for blue and black resistive clips. Pt. worked on placing the clips at various vertical and horizontal angles. Tactile and verbal cues were required for eliciting the desired movement. Placed and removed x2 in vertical and horizontal positions.   Therapeutic Activity:  Pt focused on improving pinch strength and FMC with manipulating tightly wound nuts and bolts positioned vertically. Unthreaded and rethreaded x6 1" nuts, required increased time and cues for hand control during the  task.   PATIENT EDUCATION: Education details: Progress towards goals Person educated: Patient Education method: Explanation, vc Education comprehension: verbalized understanding  HOME EXERCISE PROGRAM: Theraputty, tendon glides, intrinsic strengthening with digit abd/add and lumbrical isometric strengthening.  GOALS: Goals reviewed with patient? Yes  SHORT TERM GOALS: Target date:  02/26/2023   Pt will demonstrate ability to hold and utilize utensils for cutting meat with  modified independence. Baseline: difficulty with cutting, stabilizing item with fork for cutting; 01/15/23: pt reports he can cut meat, but it's awkward Goal status: MET  2.  Pt will demonstrate ability to obtain keys from left pocket without difficult and without dropping.   Baseline: pt struggles with reaching into left pocket to get keys; 5/22: still challenging but can do it without dropping Goal status: MET   LONG TERM GOALS: Target date: 05/09/2023   Pt will demonstrate HEP with modified independence.   Baseline: no current program for left UE. (Has green tband but needs educ on exercises); 01/15/23: using theraputty and has recently begun to focus on L hand intrinsic strengthening; 03/05/23 continue to update HEP as he progresses; 04/09/23: continues to use theraputty and focus on intrinsic strengthening Goal status: IN PROGRESS  2.  Pt will improve left UE coordination by 10 secs on 9 hole peg test to demonstrate shoe tying with modified independence.  Baseline: currently wearing slip on shoes and unable to tie shoes, left 9 hole peg test in 56 sec at eval; 01/15/23: pt reports he can tie shoes, sometimes requires multiple attempts, but can do it.  Often chooses to wear slip ons as this is still challenging; L 9 hole 43 sec; 8/14: L 9 hole 45 sec; Pt chooses slip on shoes most of the time as laces require extra time to tie Goal status: MET/ongoing  3.  Pt will demonstrate improved coordination in left hand to  demonstrate buttoning buttons on clothing with modified independence.  Baseline: difficulty with buttons and has been wearing more pullovers to avoid challenge of buttons; 01/15/23: pt reports still challenging Goal status: MET  4.  Pt will demonstrate improvement in bilat grip strength by 5# to open jars and containers with modified independence and greater ease (revised to bilat grip). Baseline: Difficulty with opening jars and containers with use of left hand at eval; 01/15/23: pt reports easier time opening a water bottle top without dicem, but uses dycem to open jars (increased L hand weakness as compared to last progress update, but pt reports increased weakness this week from having excess fluid pulled off him during dialysis and would like to re-measure on a Monday after pt has had a couple days off of dialysis); 04/09/23: uses dycem which helps a great deal; L grip 35 lbs, R 42 lbs Goal status: IN PROGRESS/ongoing  5.  Pt will improve left UE strength by 1 mm grade to hold and hand wash dishes with modified independence.  Baseline: difficulty with being able to hand wash dishes at eval, difficulty stabilizing dish; 01/15/23: easier with dishes, harder with a heavy pan; 04/09/23: LUE strength 5/5 through shoulder, elbow, wrist Goal status: MET  6.  Pt will demonstrate FOTO score of 59 or greater to show a clinically relevant change in LUE to impact greater independence with daily ADL and IADL tasks at home and in the community.   Baseline: Eval: 53; 12/11/22: 55; 01/15/23: 56 03/05/23: score of 60; 04/09/23: FOTO 60 Goal status: MET/ongoing  ASSESSMENT:  CLINICAL IMPRESSION: Tolerated increased gripper strength in L hand at 28.9# for 1 trial, same strength in R hand for 2 trials.  Pt in agreement with plan with continued focus on increasing bilat hand strength and coordination to more efficiently manipulate ADL supplies.  PERFORMANCE DEFICITS: in functional skills including ADLs, IADLs,  coordination, dexterity, sensation, tone, ROM, strength, flexibility, Fine motor control, balance, endurance, decreased knowledge of use of DME, and UE functional use,  cognitive skills including, and psychosocial skills including environmental adaptation, habits, and routines and behaviors.   IMPAIRMENTS: are limiting patient from ADLs, IADLs, and social participation.   CO-MORBIDITIES: has co-morbidities such as CAD, ESRD with dialysis, deafness in right ear, diabetic retinopathy, HTN, neuropathy  that affects occupational performance. Patient will benefit from skilled OT to address above impairments and improve overall function.  MODIFICATION OR ASSISTANCE TO COMPLETE EVALUATION: Min-Moderate modification of tasks or assist with assess necessary to complete an evaluation.  OT OCCUPATIONAL PROFILE AND HISTORY: Comprehensive assessment: Review of records and extensive additional review of physical, cognitive, psychosocial history related to current functional performance.  CLINICAL DECISION MAKING: Moderate - several treatment options, min-mod task modification necessary  REHAB POTENTIAL: Good  EVALUATION COMPLEXITY: Moderate PLAN:  OT FREQUENCY: 2x/week  OT DURATION: 4 weeks  PLANNED INTERVENTIONS: self care/ADL training, therapeutic exercise, therapeutic activity, neuromuscular re-education, manual therapy, balance training, paraffin, moist heat, contrast bath, patient/family education, and DME and/or AE instructions  RECOMMENDED OTHER SERVICES: Pt is being seen by PT currently.   CONSULTED AND AGREED WITH PLAN OF CARE: Patient  PLAN FOR NEXT SESSION: grip, pinch and coordination skills.   Kathie Dike, M.S. OTR/L  04/14/23, 8:54 AM  ascom [phone]

## 2023-04-15 ENCOUNTER — Telehealth: Payer: Self-pay | Admitting: Internal Medicine

## 2023-04-15 ENCOUNTER — Ambulatory Visit: Payer: Medicare Other

## 2023-04-15 NOTE — Telephone Encounter (Signed)
Copied from CRM [id number]. Topic: Medicare AWV >> Apr 15, 2023 10:43 AM Payton Doughty wrote: Reason for CRM: LM to sched AWV- 04/14/2023   Verlee Rossetti; Care Guide Ambulatory Clinical Support [place] [facility] Direct Dial: [phone]

## 2023-04-15 NOTE — Therapy (Signed)
OUTPATIENT PHYSICAL THERAPY NEURO TREATMENT   Patient Name: Gregory Crane MRN: [id number] DOB:[birth year], 52 y.o., male Today's Date: 04/16/2023  PCP: Dale Ironville MD REFERRING PROVIDER: Dale Simms MD  PT End of Session - 04/16/23 1018     Visit Number 68    Number of Visits 80    Date for PT Re-Evaluation 04/28/23    Authorization Type Medicare    Authorization Time Period 11/11/22-02/03/23    Authorization - Number of Visits 12    Progress Note Due on Visit 60    PT Start Time 1017    PT Stop Time 1058    PT Time Calculation (min) 41 min    Equipment Utilized During Treatment Gait belt    Activity Tolerance Patient tolerated treatment well;No increased pain    Behavior During Therapy WFL for tasks assessed/performed                   Past Medical History:  Diagnosis Date   Allergy    Anemia    Bell's palsy    Diabetes mellitus without complication (HCC)    diet controlled   Hypertension    Hypothyroidism    Kidney stones    Pseudotumor cerebri    Stroke [facility])    Past Surgical History:  Procedure Laterality Date   COLONOSCOPY WITH PROPOFOL N/A 03/30/2020   Procedure: COLONOSCOPY WITH PROPOFOL;  Surgeon: Regis Bill, MD;  Location: [facility] ENDOSCOPY;  Service: Endoscopy;  Laterality: N/A;   LOOP RECORDER INSERTION N/A 01/27/2018   Procedure: LOOP RECORDER INSERTION;  Surgeon: Duke Salvia, MD;  Location: [facility] INVASIVE CV LAB;  Service: Cardiovascular;  Laterality: N/A;   LUMBAR PUNCTURE     as child   NO PAST SURGERIES     TEE WITHOUT CARDIOVERSION N/A 01/07/2018   Procedure: TRANSESOPHAGEAL ECHOCARDIOGRAM (TEE);  Surgeon: Antonieta Iba, MD;  Location: [facility] ORS;  Service: Cardiovascular;  Laterality: N/A;   Patient Active Problem List   Diagnosis Date Noted   Unsteady gait 10/05/2022   Steal syndrome of dialysis vascular access (HCC) 05/28/2022   Hydronephrosis, left 05/04/2022   Type II diabetes mellitus with renal manifestations  (HCC) 05/04/2022   Leukocytosis 05/04/2022   Pre-op evaluation 04/24/2022   Chest pain 01/13/2022   Diabetic retinopathy associated with diabetes mellitus due to underlying condition (HCC) 01/13/2022   ESRD on hemodialysis (HCC) 01/13/2022   Deafness in right ear 08/11/2021   Headache 07/07/2021   Hearing loss 07/07/2021   Open wound 01/25/2021   History of colon polyps 10/15/2020   Postoperative hemorrhage involving digestive system following digestive system procedure 09/19/2020   Acute cholecystitis without calculus 09/09/2020   Type 2 diabetes mellitus, with long-term current use of insulin (HCC) 09/09/2020   Cryptogenic stroke (HCC) 07/13/2020   History of loop recorder 07/13/2020   Weakness 06/18/2020   History of 2019 novel coronavirus disease (COVID-19) 05/20/2020   ESRD (end stage renal disease) (HCC) 04/04/2020   Pneumonia due to COVID-19 virus 04/03/2020   AKI (acute kidney injury) (HCC) 04/03/2020   Elevated troponin 04/03/2020   Acquired trigger finger 06/08/2019   Lymphedema 06/08/2019   Anemia 04/17/2019   Swelling of left lower extremity 01/10/2019   Facial droop 04/09/2018   Daytime somnolence 03/30/2018   Carotid artery disease (HCC) 02/03/2018   Intracranial vascular stenosis 09/12/2017   Cough 01/20/2017   Bell's palsy 11/10/2016   History of CVA (cerebrovascular accident) 11/10/2016   Benign localized hyperplasia of prostate with urinary obstruction  10/27/2016   History of nephrolithiasis 10/27/2016   TIA (transient ischemic attack) 10/20/2016   Near syncope 06/23/2016   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   Neuropathy 08/06/2015   Health care maintenance 08/06/2015   Essential hypertension 08/06/2015   Heme positive stool 10/10/2013   Hypothyroidism 10/10/2013   Microalbuminuria 10/10/2013   Hyperlipidemia 10/10/2013   B12 deficiency 10/10/2013   Diabetes (HCC) 07/04/2013   Environmental allergies 07/04/2013   ONSET DATE: 2-3 years  REFERRING DIAG:  Neuropathy  THERAPY DIAG:  Muscle weakness (generalized)  Unsteadiness on feet  Abnormality of gait and mobility  Difficulty in walking, not elsewhere classified  Rationale for Evaluation and Treatment Rehabilitation  SUBJECTIVE:                                                                                                                                                                                             SUBJECTIVE STATEMENT: Patient reports he finally slept last night for the first time in multiple nights.    Pt accompanied by: self    PERTINENT HISTORY: Patient presents to physical therapy for neuropathy. He underwent banding of his L UE fistula on 05/13/22 and scheduled for additional banding on 06/07/22. 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 Patient reports his balance is very unsteady due to neuropathy in feet, feels weak in LE's.   PAIN:  Denies pain, but reports stiffness   PRECAUTIONS: Fall  WEIGHT BEARING RESTRICTIONS Yes no lifting >5 lb in arm   FALLS: Has patient fallen in last 6 months? No  PLOF: Independent  PATIENT GOALS to be more steady and walk as normally as possible.   OBJECTIVE:   TODAY'S TREATMENT:   Unless otherwise stated, CGA was provided and gait belt donned in order to ensure pt safety  TherEx:   3lb ankle weight:  -450 ft with CGA ambulation with cues for foot clearance. LLE has limited eccentric control ; x 2 trials  -4" step up/down 10x each LE     Seated: Heel toe raises 20x  Seated march 20x   Neuro Re-ed:  Airex: -static stand reach for letters and sort in alphabetical order. X 8 minutes  -airex pad 4" step tandem stance 30 seconds each LE   PATIENT EDUCATION: Education details: Pt educated throughout session about proper  posture and technique with exercises. Improved exercise technique, movement at target joints, use of target muscles after min to mod verbal, visual, tactile cues.  Person educated: Patient Education method: Explanation, Demonstration,  Tactile cues, and Verbal cues Education comprehension: verbalized understanding, returned demonstration, verbal cues required, and tactile cues required  HOME EXERCISE PROGRAM: Access Code: [email or web address] URL: [email or web address] Date: 03/10/2023 Prepared by: Precious Bard Exercises - Seated Heel Toe Raises  - 1 x daily - 7 x weekly - 2 sets - 10 reps - 5 hold - Standing Tandem Balance with Counter Support  - 1 x daily - 7 x weekly - 2 sets - 2 reps - 30 hold - Standing March with Counter Support  - 1 x daily - 7 x weekly - 2 sets - 10 reps - 5 hold - Ankle Inversion Eversion Towel Slide  - 1 x daily - 7 x weekly - 1 sets - 15 reps - 5 hold - Standing Hip Flexor Stretch  - 1 x daily - 7 x weekly - 2 sets - 1 reps - 60 hold - Ankle Rotations in Sitting  - 1 x daily - 7 x weekly - 1 sets - 20 reps - 5 hold - Supine Piriformis Stretch with Leg Straight  - 1 x daily - 7 x weekly - 2 sets - 2 reps - 60 hold   GOALS: Goals reviewed with patient? Yes   SHORT TERM GOALS: Target date: 07/01/2022  Patient will be independent in home exercise program to improve strength/mobility for better functional independence with ADLs. Baseline:10/9; HEP given 2/21: HEP compliant  Goal status: MET   LONG TERM GOALS: Target date:04/28/2023    Patient will increase FOTO score to equal to or greater than   63%  to demonstrate statistically significant improvement in mobility and quality of life.  Baseline: 10/9: 53% 11/29: 58% 12/27: 51% 1/17: 56% 2/21: 53% 3/18: 60%  4/8 58%; 6/26: 60% Goal status: IN PROGRESS  2.  Patient (< 72 years old) will complete five times sit to stand test in < 10 seconds without UE support indicating an increased LE strength and improved  balance. Baseline: 10/9: 19.6 seconds with walking stick; one LOB 11/29: 20.56 seconds 12/27: 13.4 seconds hands on knees 1/17: 13 seconds hands on knees  2/21: 14 seconds no hands 3/18: 12 seconds no hands. 4/8 11.3 sec UE on knees. 5/13: 10.94 sec pushing from thighs. 6/24: 10.08 sec, pushing from thighs  Goal status: IN PROGRESS  3.  Patient will increase Berg Balance score by > 45/56 to demonstrate decreased fall risk during functional activities. Baseline: 10/9: 33/56 11/29: 16/10 96/04: 54/09 8/11:  91/47 2/21: 46/56 3/18: 48/56 Goal status: MET  +4.  Patient will increase 10 meter walk test to >1.60m/s as to improve gait speed for better community ambulation and to reduce fall risk. Baseline: 11/29: 1.19 m/s Goal status: MET  5.  Patient will increase ABC scale score >80% to demonstrate better functional mobility and better confidence with ADLs.  Baseline: 10/9: 67% 11/29: 75.6% 12/27: 78%  2/21: 78% 3/18: 80% Goal status: MET  6. Patient will tolerate 5 seconds of single leg stance without loss of balance to improve ability to get in and out of shower safely. Baseline: 11/29: unable to stand on single leg safely 12/27: 2 seconds 1/17: 3 seconds 2/21: 3 seconds 3/18: 4 seconds. 4/8: 3 sec  5/13: LLE x 3 sec and RLE x 4 sec; 6/26:  Goal status: IN PROGRESS   6. Patient will increase six minute walk test distance to >1000 for progression to community ambulator and improve gait ability Baseline: 12/27: 845 ft with with walking stick 1/17: 751ft 2/21:  930 ft  3/18: 950 ft 4/8: 1146ft. 5/13: 1185 Goal status: met  7. Patient will increase dynamic gait index score to >19/24 as to demonstrate reduced fall risk and improved dynamic gait balance for better safety with community/home ambulation.  Baseline: 3/18:  9/24. 4/8: 16/24 5/13: 18/24 Goal status: in Progress  8. Patient will increase six minute walk test distance to >1500 for progression to community ambulator and improve gait  ability Baseline: . 5/13: 1185; 6/26: 1145 with good gait mechanics  Goal status: IN PROGRESS    ASSESSMENT: CLINICAL IMPRESSION:  Patient requires multimodal cueing for heel strike and improved eccentric control. He is educated on need to join a gym prior to discharge from PT, patient agreeable. Educated on silver sneakers. Will need to work on step endurance. The pt will continue to benefit from skilled PT to make progress in BLE strength, balance, and coordination to return to PLOF and improve QoL.    OBJECTIVE IMPAIRMENTS Abnormal gait, decreased activity tolerance, decreased balance, decreased coordination, decreased endurance, decreased mobility, difficulty walking, decreased strength, impaired flexibility, impaired sensation, improper body mechanics, and pain.   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   REHAB POTENTIAL: Good  CLINICAL DECISION MAKING: Evolving/moderate complexity  EVALUATION COMPLEXITY: Moderate  PLAN: PT FREQUENCY: 2x/week  PT DURATION: 12 weeks  PLANNED INTERVENTIONS: Therapeutic exercises, Therapeutic activity, Neuromuscular re-education, Balance training, Gait training, Patient/Family education, Self Care, Joint mobilization, Stair  training, Vestibular training, Canalith repositioning, Visual/preceptual remediation/compensation, DME instructions, Dry Needling, Cognitive remediation, Spinal mobilization, Cryotherapy, Moist heat, Manual lymph drainage, Compression bandaging, Taping, Vasopneumatic device, Ultrasound, Manual therapy, and Re-evaluation  PLAN FOR NEXT SESSION:   Follow up re: ankle stab.; calf strength, uneven surfaces; pt reports feeling weakness in hips lately, revisit flexor and glute strengthening    Precious Bard PT ,DPT Physical Therapist- [place]  [facility]

## 2023-04-16 ENCOUNTER — Ambulatory Visit: Payer: Medicare Other

## 2023-04-16 DIAGNOSIS — R278 Other lack of coordination: Secondary | ICD-10-CM

## 2023-04-16 DIAGNOSIS — M6281 Muscle weakness (generalized): Secondary | ICD-10-CM | POA: Diagnosis not present

## 2023-04-16 DIAGNOSIS — R262 Difficulty in walking, not elsewhere classified: Secondary | ICD-10-CM

## 2023-04-16 DIAGNOSIS — R269 Unspecified abnormalities of gait and mobility: Secondary | ICD-10-CM

## 2023-04-16 DIAGNOSIS — R2681 Unsteadiness on feet: Secondary | ICD-10-CM

## 2023-04-16 NOTE — Therapy (Signed)
OCCUPATIONAL THERAPY NEURO TREATMENT NOTE  Patient Name: Gregory Crane MRN: [id number] DOB:[birth year], 52 y.o., male  PCP: Dale Adair REFERRING PROVIDER: Dale Graham  END OF SESSION:  OT End of Session - 04/16/23 1101     Visit Number 35    Number of Visits 37    Date for OT Re-Evaluation 05/09/23    Progress Note Due on Visit 10    OT Start Time 1100    OT Stop Time 1145    OT Time Calculation (min) 45 min    Activity Tolerance Patient tolerated treatment well    Behavior During Therapy WFL for tasks assessed/performed               Past Medical History:  Diagnosis Date   Allergy    Anemia    Bell's palsy    Diabetes mellitus without complication (HCC)    diet controlled   Hypertension    Hypothyroidism    Kidney stones    Pseudotumor cerebri    Stroke [facility])    Past Surgical History:  Procedure Laterality Date   COLONOSCOPY WITH PROPOFOL N/A 03/30/2020   Procedure: COLONOSCOPY WITH PROPOFOL;  Surgeon: Regis Bill, MD;  Location: [facility] ENDOSCOPY;  Service: Endoscopy;  Laterality: N/A;   LOOP RECORDER INSERTION N/A 01/27/2018   Procedure: LOOP RECORDER INSERTION;  Surgeon: Duke Salvia, MD;  Location: [facility] INVASIVE CV LAB;  Service: Cardiovascular;  Laterality: N/A;   LUMBAR PUNCTURE     as child   NO PAST SURGERIES     TEE WITHOUT CARDIOVERSION N/A 01/07/2018   Procedure: TRANSESOPHAGEAL ECHOCARDIOGRAM (TEE);  Surgeon: Antonieta Iba, MD;  Location: [facility] ORS;  Service: Cardiovascular;  Laterality: N/A;   Patient Active Problem List   Diagnosis Date Noted   Unsteady gait 10/05/2022   Steal syndrome of dialysis vascular access (HCC) 05/28/2022   Hydronephrosis, left 05/04/2022   Type II diabetes mellitus with renal manifestations (HCC) 05/04/2022   Leukocytosis 05/04/2022   Pre-op evaluation 04/24/2022   Chest pain 01/13/2022   Diabetic retinopathy associated with diabetes mellitus due to underlying condition (HCC) 01/13/2022   ESRD on  hemodialysis (HCC) 01/13/2022   Deafness in right ear 08/11/2021   Headache 07/07/2021   Hearing loss 07/07/2021   Open wound 01/25/2021   History of colon polyps 10/15/2020   Postoperative hemorrhage involving digestive system following digestive system procedure 09/19/2020   Acute cholecystitis without calculus 09/09/2020   Type 2 diabetes mellitus, with long-term current use of insulin (HCC) 09/09/2020   Cryptogenic stroke (HCC) 07/13/2020   History of loop recorder 07/13/2020   Weakness 06/18/2020   History of 2019 novel coronavirus disease (COVID-19) 05/20/2020   ESRD (end stage renal disease) (HCC) 04/04/2020   Pneumonia due to COVID-19 virus 04/03/2020   AKI (acute kidney injury) (HCC) 04/03/2020   Elevated troponin 04/03/2020   Acquired trigger finger 06/08/2019   Lymphedema 06/08/2019   Anemia 04/17/2019   Swelling of left lower extremity 01/10/2019   Facial droop 04/09/2018   Daytime somnolence 03/30/2018   Carotid artery disease (HCC) 02/03/2018   Intracranial vascular stenosis 09/12/2017   Cough 01/20/2017   Bell's palsy 11/10/2016   History of CVA (cerebrovascular accident) 11/10/2016   Benign localized hyperplasia of prostate with urinary obstruction 10/27/2016   History of nephrolithiasis 10/27/2016   TIA (transient ischemic attack) 10/20/2016   Near syncope 06/23/2016   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   Neuropathy 08/06/2015   Health care maintenance 08/06/2015   Essential hypertension 08/06/2015  Heme positive stool 10/10/2013   Hypothyroidism 10/10/2013   Microalbuminuria 10/10/2013   Hyperlipidemia 10/10/2013   B12 deficiency 10/10/2013   Diabetes (HCC) 07/04/2013   Environmental allergies 07/04/2013   ONSET DATE: May 2023  REFERRING DIAG: CVA  THERAPY DIAG:  Muscle weakness (generalized)  Other lack of coordination  Rationale for Evaluation and Treatment: Rehabilitation  SUBJECTIVE:  SUBJECTIVE STATEMENT: Pt reports that he slept better last  night after not sleeping well for the last week.    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    PRECAUTIONS: Fall and Other: No strengthening exercises greater than 5-10 pounds in left arm.   Dialysis Tues, Th, Sat.  WEIGHT BEARING RESTRICTIONS: No  PAIN:  Are you having pain? No  FALLS: Has patient fallen in last 6 months? No  LIVING ENVIRONMENT: Lives with: alone Lives in: House/apartment Stairs: Yes: Internal: 15 steps; on right going up Has following equipment at home: Single point cane and Walker - 2 wheeled  PLOF: Independent  PATIENT GOALS: Pt reports he wants to have better use of his left hand, be able to do buttons and shoes.   OBJECTIVE:   HAND DOMINANCE: Right  ADLs: Overall ADLs: modified independent with most tasks Transfers/ambulation related to ADLs: Now walking without an assistive device, used cane in the past.  Eating: Independent with self feeding, difficulty with cutting food, requires increased focus and effort. Grooming: Independent UB Dressing: Difficulty with buttons, uses pullovers most of the time  LB Dressing: Difficulty with tying shoes Toileting: Independent Bathing: Independent Tub Shower transfers: Independent Equipment: none  IADLs: Shopping: independent,  uses a cart to push in the store Light housekeeping: modified independent Meal Prep: difficulty with tasks requiring use of left hand, bilateral hand use.   Community mobility: Ambulates without use of assistive device, able to drive Medication management: Independent Financial management:  Independent Handwriting: 90% legible  MOBILITY STATUS: Needs Assist: Pt has progressed with PT, used to use a cane but ambulating without an assistive device but still has balance difficulties.    POSTURE COMMENTS:    Sitting balance: Sits without UE support up to 30 sec  ACTIVITY TOLERANCE: Activity tolerance: limited activity tolerance  FUNCTIONAL OUTCOME MEASURES: FOTO: 53 12/11/22: FOTO: 55 01/15/23: 56 04/09/23: 60  UPPER EXTREMITY ROM:    Active ROM Right eval Left eval  Shoulder flexion [facility]  Shoulder abduction [facility]  Shoulder adduction [facility]  Shoulder extension [facility]  Shoulder internal rotation [facility]  Shoulder external rotation [facility]  Elbow flexion [facility] WFL  Elbow extension [facility]  Wrist flexion [facility] WFL  Wrist extension [facility] WFL  Wrist ulnar deviation [facility] WFL  Wrist radial deviation [facility] WFL  Wrist pronation [facility] WFL  Wrist supination WFL WFL  (Blank rows = not tested)  UPPER EXTREMITY MMT:     MMT Right eval Left eval Left 01/15/23 Left 04/09/23  Shoulder flexion 5/5 3+/5 4+ 5  Shoulder abduction 5/5 3+/5 4+ 5  Shoulder adduction      Shoulder extension      Shoulder internal rotation      Shoulder external rotation  Middle trapezius      Lower trapezius      Elbow flexion 5/5 3+/5 5 5   Elbow extension 5/5 3+/5 4+ 5  Wrist flexion 5/5 3+/5 4+ 5  Wrist extension 5/5 3+/5 4+ 5  Wrist ulnar deviation 5/5 3+/5 4+ 5  Wrist radial deviation 5/5 3+/5 4+ 5  Wrist pronation 5/5 3+/5 4+ 5  Wrist supination 5/5 3+/5 4+ 5  (Blank rows = not tested)  Opposition of thumb to all digits on right, on left hand opposition to ring finger but cannot demonstrate to small finger.   HAND FUNCTION: Grip strength: Right: 44 lbs; Left: 35 lbs, Lateral pinch: Right: 17 lbs, Left: 16 lbs, 3 point pinch: Right: 14 lbs, Left: 0 lbs, and Tip pinch: Right 2 lbs, Left: 0 lbs 12/23/2022: Grip strength: Right: 50 lbs; Left: 43 lbs, Lateral pinch: Right: 10 lbs, Left:  8 lbs, 3 point pinch: Right:  9 lbs, Left: 8 lbs, and Tip pinch: Right 8 lbs, Left: 4 lbs 01/15/23: R grip 45 lbs, Left: 34 lbs; R lateral pinch 10 lbs, L 7 lbs; 3 point pinch R: 7 lbs, L 7 lbs; tip pinch R: 8 lbs, L 4 lbs (At dialysis yesterday, pt reports they pulled a lot of fluid and pt was so tired that he almost got sick) 02/17/2023: Grip strength: Right: 50 lbs; Left: 45  lbs, Lateral pinch: Right: 10  lbs, Left: 17 lbs, 3 point pinch: Right: 9  lbs, Left: 10 lbs, and Tip pinch: Right  8 lbs, Left: 5 lbs 03/05/2023: Grip strength: Right: 50 lbs; Left: 41  lbs, Lateral pinch: Right: 10 lbs, Left: 10 lbs, 3 point pinch: Right: 9  lbs, Left: 7 lbs, and Tip pinch: Right  8 lbs, Left: 7 lbs 04/09/2023: Grip strength: Right: 42 lbs; Left: 35  lbs, Lateral pinch: Right: 10 lbs, Left: 6 lbs, 3 point pinch: Right: 7  lbs, Left: 4 lbs, and Tip pinch: Right  8 lbs, Left: 5 lbs  COORDINATION: 9 Hole Peg test: Right: 36 sec; Left: 56 sec 12/23/2022: 9 Hole Peg test: Right: 32 sec; Left: 45 sec 01/15/23: R hand 26 sec, Left: 43 sec 02/17/23: R hand 39 sec, Left: 53 sec 03/05/23: R hand 36 sec, Left: 50 sec 04/09/23: R hand 38 sec, Left: 45 sec  TODAY'S TREATMENT: 04/16/2023   Therapeutic Exercise: Facilitated hand strengthening with use of hand gripper set at 23.4# on the L and 28.9# on the R to remove jumbo pegs from pegboard x3 trials each hand. Pt worked on Marketing executive in the L/R hands for 3 pt pinch using yellow, red, green clips and lateral pinch for all colors on the R and all colors but black on the L.  Pt completed 2 trials for each pinch type on each hand.  Pt. worked on placing the clips at vertical angles. Verbal cues and demo were required for eliciting the desired movement.  Pt completed 3 sets 10 reps for digit ext in each hand using the CanDo resistive hand strengthener.  PATIENT EDUCATION: Education details: BUE strengthening Person educated: Patient Education method: Explanation,  vc, demo Education comprehension: verbalized understanding, demonstrated understanding  HOME EXERCISE PROGRAM: Theraputty, tendon glides, intrinsic strengthening with digit abd/add and lumbrical isometric strengthening.  GOALS: Goals reviewed with patient? Yes  SHORT TERM GOALS: Target date:  02/26/2023   Pt will demonstrate ability to hold and utilize utensils for cutting meat with modified independence. Baseline: difficulty with cutting, stabilizing item with fork  for cutting; 01/15/23: pt reports he can cut meat, but it's awkward Goal status: MET  2.  Pt will demonstrate ability to obtain keys from left pocket without difficult and without dropping.   Baseline: pt struggles with reaching into left pocket to get keys; 5/22: still challenging but can do it without dropping Goal status: MET   LONG TERM GOALS: Target date: 05/09/2023   Pt will demonstrate HEP with modified independence.   Baseline: no current program for left UE. (Has green tband but needs educ on exercises); 01/15/23: using theraputty and has recently begun to focus on L hand intrinsic strengthening; 03/05/23 continue to update HEP as he progresses; 04/09/23: continues to use theraputty and focus on intrinsic strengthening Goal status: IN PROGRESS  2.  Pt will improve left UE coordination by 10 secs on 9 hole peg test to demonstrate shoe tying with modified independence.  Baseline: currently wearing slip on shoes and unable to tie shoes, left 9 hole peg test in 56 sec at eval; 01/15/23: pt reports he can tie shoes, sometimes requires multiple attempts, but can do it.  Often chooses to wear slip ons as this is still challenging; L 9 hole 43 sec; 8/14: L 9 hole 45 sec; Pt chooses slip on shoes most of the time as laces require extra time to tie Goal status: MET/ongoing  3.  Pt will demonstrate improved coordination in left hand to demonstrate buttoning buttons on clothing with modified independence.  Baseline: difficulty with  buttons and has been wearing more pullovers to avoid challenge of buttons; 01/15/23: pt reports still challenging Goal status: MET  4.  Pt will demonstrate improvement in bilat grip strength by 5# to open jars and containers with modified independence and greater ease (revised to bilat grip). Baseline: Difficulty with opening jars and containers with use of left hand at eval; 01/15/23: pt reports easier time opening a water bottle top without dicem, but uses dycem to open jars (increased L hand weakness as compared to last progress update, but pt reports increased weakness this week from having excess fluid pulled off him during dialysis and would like to re-measure on a Monday after pt has had a couple days off of dialysis); 04/09/23: uses dycem which helps a great deal; L grip 35 lbs, R 42 lbs Goal status: IN PROGRESS/ongoing  5.  Pt will improve left UE strength by 1 mm grade to hold and hand wash dishes with modified independence.  Baseline: difficulty with being able to hand wash dishes at eval, difficulty stabilizing dish; 01/15/23: easier with dishes, harder with a heavy pan; 04/09/23: LUE strength 5/5 through shoulder, elbow, wrist Goal status: MET  6.  Pt will demonstrate FOTO score of 59 or greater to show a clinically relevant change in LUE to impact greater independence with daily ADL and IADL tasks at home and in the community.   Baseline: Eval: 53; 12/11/22: 55; 01/15/23: 56 03/05/23: score of 60; 04/09/23: FOTO 60 Goal status: MET/ongoing  ASSESSMENT: CLINICAL IMPRESSION: Tolerated increased number of sets (3) on hand gripper today to remove jumbo pegs from pegboard; L hand at 23.4# and R hand at 28.9# for all 3 trials.  Blue and black resistive clips remain a challenge in either hand when targeting lateral and 3 point pinch.  Pt in agreement with plan with continued focus on increasing bilat hand strength and coordination to more efficiently manipulate ADL supplies.  PERFORMANCE DEFICITS:  in functional skills including ADLs, IADLs, coordination, dexterity, sensation, tone, ROM,  strength, flexibility, Fine motor control, balance, endurance, decreased knowledge of use of DME, and UE functional use, cognitive skills including, and psychosocial skills including environmental adaptation, habits, and routines and behaviors.   IMPAIRMENTS: are limiting patient from ADLs, IADLs, and social participation.   CO-MORBIDITIES: has co-morbidities such as CAD, ESRD with dialysis, deafness in right ear, diabetic retinopathy, HTN, neuropathy  that affects occupational performance. Patient will benefit from skilled OT to address above impairments and improve overall function.  MODIFICATION OR ASSISTANCE TO COMPLETE EVALUATION: Min-Moderate modification of tasks or assist with assess necessary to complete an evaluation.  OT OCCUPATIONAL PROFILE AND HISTORY: Comprehensive assessment: Review of records and extensive additional review of physical, cognitive, psychosocial history related to current functional performance.  CLINICAL DECISION MAKING: Moderate - several treatment options, min-mod task modification necessary  REHAB POTENTIAL: Good  EVALUATION COMPLEXITY: Moderate PLAN:  OT FREQUENCY: 2x/week  OT DURATION: 4 weeks  PLANNED INTERVENTIONS: self care/ADL training, therapeutic exercise, therapeutic activity, neuromuscular re-education, manual therapy, balance training, paraffin, moist heat, contrast bath, patient/family education, and DME and/or AE instructions  RECOMMENDED OTHER SERVICES: Pt is being seen by PT currently.   CONSULTED AND AGREED WITH PLAN OF CARE: Patient  PLAN FOR NEXT SESSION: grip, pinch and coordination skills.  Danelle Earthly, MS, OTR/L

## 2023-04-17 ENCOUNTER — Ambulatory Visit: Payer: Medicare Other

## 2023-04-21 ENCOUNTER — Ambulatory Visit: Payer: Medicare Other | Admitting: Physical Therapy

## 2023-04-21 ENCOUNTER — Ambulatory Visit: Payer: Medicare Other | Admitting: Occupational Therapy

## 2023-04-21 DIAGNOSIS — M6281 Muscle weakness (generalized): Secondary | ICD-10-CM

## 2023-04-21 DIAGNOSIS — R2689 Other abnormalities of gait and mobility: Secondary | ICD-10-CM

## 2023-04-21 DIAGNOSIS — R278 Other lack of coordination: Secondary | ICD-10-CM

## 2023-04-21 DIAGNOSIS — R2681 Unsteadiness on feet: Secondary | ICD-10-CM

## 2023-04-21 DIAGNOSIS — R262 Difficulty in walking, not elsewhere classified: Secondary | ICD-10-CM

## 2023-04-21 DIAGNOSIS — R269 Unspecified abnormalities of gait and mobility: Secondary | ICD-10-CM

## 2023-04-21 NOTE — Therapy (Signed)
OCCUPATIONAL THERAPY NEURO TREATMENT NOTE  Patient Name: Gregory Crane MRN: [id number] DOB:[birth year], 52 y.o., male  PCP: [place] REFERRING PROVIDER: Dale [place]  END OF SESSION:  OT End of Session - 04/21/23 0901     Visit Number 36    Number of Visits 61    Date for OT Re-Evaluation 05/09/23    OT Start Time 0855    OT Stop Time 0940    OT Time Calculation (min) 45 min    Activity Tolerance Patient tolerated treatment well    Behavior During Therapy [facility] for tasks assessed/performed               Past Medical History:  Diagnosis Date   Allergy    Anemia    Bell's palsy    Diabetes mellitus without complication (HCC)    diet controlled   Hypertension    Hypothyroidism    Kidney stones    Pseudotumor cerebri    Stroke [facility])    Past Surgical History:  Procedure Laterality Date   COLONOSCOPY WITH PROPOFOL N/A 03/30/2020   Procedure: COLONOSCOPY WITH PROPOFOL;  Surgeon: Regis Bill, MD;  Location: [facility] ENDOSCOPY;  Service: Endoscopy;  Laterality: N/A;   LOOP RECORDER INSERTION N/A 01/27/2018   Procedure: LOOP RECORDER INSERTION;  Surgeon: Duke Salvia, MD;  Location: [facility] INVASIVE CV LAB;  Service: Cardiovascular;  Laterality: N/A;   LUMBAR PUNCTURE     as child   NO PAST SURGERIES     TEE WITHOUT CARDIOVERSION N/A 01/07/2018   Procedure: TRANSESOPHAGEAL ECHOCARDIOGRAM (TEE);  Surgeon: Antonieta Iba, MD;  Location: [facility] ORS;  Service: Cardiovascular;  Laterality: N/A;   Patient Active Problem List   Diagnosis Date Noted   Unsteady gait 10/05/2022   Steal syndrome of dialysis vascular access (HCC) 05/28/2022   Hydronephrosis, left 05/04/2022   Type II diabetes mellitus with renal manifestations (HCC) 05/04/2022   Leukocytosis 05/04/2022   Pre-op evaluation 04/24/2022   Chest pain 01/13/2022   Diabetic retinopathy associated with diabetes mellitus due to underlying condition (HCC) 01/13/2022   ESRD on hemodialysis (HCC) 01/13/2022    Deafness in right ear 08/11/2021   Headache 07/07/2021   Hearing loss 07/07/2021   Open wound 01/25/2021   History of colon polyps 10/15/2020   Postoperative hemorrhage involving digestive system following digestive system procedure 09/19/2020   Acute cholecystitis without calculus 09/09/2020   Type 2 diabetes mellitus, with long-term current use of insulin (HCC) 09/09/2020   Cryptogenic stroke (HCC) 07/13/2020   History of loop recorder 07/13/2020   Weakness 06/18/2020   History of 2019 novel coronavirus disease (COVID-19) 05/20/2020   ESRD (end stage renal disease) (HCC) 04/04/2020   Pneumonia due to COVID-19 virus 04/03/2020   AKI (acute kidney injury) (HCC) 04/03/2020   Elevated troponin 04/03/2020   Acquired trigger finger 06/08/2019   Lymphedema 06/08/2019   Anemia 04/17/2019   Swelling of left lower extremity 01/10/2019   Facial droop 04/09/2018   Daytime somnolence 03/30/2018   Carotid artery disease (HCC) 02/03/2018   Intracranial vascular stenosis 09/12/2017   Cough 01/20/2017   Bell's palsy 11/10/2016   History of CVA (cerebrovascular accident) 11/10/2016   Benign localized hyperplasia of prostate with urinary obstruction 10/27/2016   History of nephrolithiasis 10/27/2016   TIA (transient ischemic attack) 10/20/2016   Near syncope 06/23/2016   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   Neuropathy 08/06/2015   Health care maintenance 08/06/2015   Essential hypertension 08/06/2015   Heme positive stool 10/10/2013   Hypothyroidism  10/10/2013   Microalbuminuria 10/10/2013   Hyperlipidemia 10/10/2013   B12 deficiency 10/10/2013   Diabetes (HCC) 07/04/2013   Environmental allergies 07/04/2013   ONSET DATE: May 2023  REFERRING DIAG: CVA  THERAPY DIAG:  Muscle weakness (generalized)  Other lack of coordination  Rationale for Evaluation and Treatment: Rehabilitation  SUBJECTIVE:  SUBJECTIVE STATEMENT:  Pt. 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    PRECAUTIONS: Fall and Other: No strengthening exercises greater than 5-10 pounds in left arm.   Dialysis Tues, Th, Sat.  WEIGHT BEARING RESTRICTIONS: No  PAIN:  Are you having pain? No  FALLS: Has patient fallen in last 6 months? No  LIVING ENVIRONMENT: Lives with: alone Lives in: House/apartment Stairs: Yes: Internal: 15 steps; on right going up Has following equipment at home: Single point cane and Walker - 2 wheeled  PLOF: Independent  PATIENT GOALS: Pt reports he wants to have better use of his left hand, be able to do buttons and shoes.   OBJECTIVE:   HAND DOMINANCE: Right  ADLs: Overall ADLs: modified independent with most tasks Transfers/ambulation related to ADLs: Now walking without an assistive device, used cane in the past.  Eating: Independent with self feeding, difficulty with cutting food, requires increased focus and effort. Grooming: Independent UB Dressing: Difficulty with buttons, uses pullovers most of the time  LB Dressing: Difficulty with tying shoes Toileting: Independent Bathing: Independent Tub Shower transfers: Independent Equipment: none  IADLs: Shopping: independent,  uses a cart to push in the store Light housekeeping: modified independent Meal Prep: difficulty with tasks requiring use of left hand, bilateral hand use.   Community mobility: Ambulates without use of assistive device, able to drive Medication management:  Independent Financial management: Independent Handwriting: 90% legible  MOBILITY STATUS: Needs Assist: Pt has progressed with PT, used to use a cane but ambulating without an assistive device but still has balance difficulties.    POSTURE COMMENTS:    Sitting balance: Sits without UE support up to 30 sec  ACTIVITY TOLERANCE: Activity tolerance: limited activity tolerance  FUNCTIONAL OUTCOME MEASURES: FOTO: 53 12/11/22: FOTO: 55 01/15/23: 56 04/09/23: 60  UPPER EXTREMITY ROM:    Active ROM Right eval Left eval  Shoulder flexion [facility]  Shoulder abduction [facility]  Shoulder adduction [facility]  Shoulder extension [facility]  Shoulder internal rotation [facility]  Shoulder external rotation [facility]  Elbow flexion [facility] WFL  Elbow extension [facility]  Wrist flexion [facility] WFL  Wrist extension [facility] WFL  Wrist ulnar deviation [facility] WFL  Wrist radial deviation [facility] WFL  Wrist pronation [facility] WFL  Wrist supination WFL WFL  (Blank rows = not tested)  UPPER EXTREMITY MMT:     MMT Right eval Left eval Left 01/15/23 Left 04/09/23  Shoulder flexion 5/5 3+/5 4+ 5  Shoulder abduction 5/5 3+/5 4+ 5  Shoulder adduction      Shoulder extension      Shoulder internal rotation      Shoulder external  rotation      Middle trapezius      Lower trapezius      Elbow flexion 5/5 3+/5 5 5   Elbow extension 5/5 3+/5 4+ 5  Wrist flexion 5/5 3+/5 4+ 5  Wrist extension 5/5 3+/5 4+ 5  Wrist ulnar deviation 5/5 3+/5 4+ 5  Wrist radial deviation 5/5 3+/5 4+ 5  Wrist pronation 5/5 3+/5 4+ 5  Wrist supination 5/5 3+/5 4+ 5  (Blank rows = not tested)  Opposition of thumb to all digits on right, on left hand opposition to ring finger but cannot demonstrate to small finger.   HAND FUNCTION: Grip strength: Right: 44 lbs; Left: 35 lbs, Lateral pinch: Right: 17 lbs, Left: 16 lbs, 3 point pinch: Right: 14 lbs, Left: 0 lbs, and Tip pinch: Right 2 lbs, Left: 0 lbs 12/23/2022: Grip strength: Right: 50 lbs; Left: 43 lbs,  Lateral pinch: Right: 10 lbs, Left: 8 lbs, 3 point pinch: Right:  9 lbs, Left: 8 lbs, and Tip pinch: Right 8 lbs, Left: 4 lbs 01/15/23: R grip 45 lbs, Left: 34 lbs; R lateral pinch 10 lbs, L 7 lbs; 3 point pinch R: 7 lbs, L 7 lbs; tip pinch R: 8 lbs, L 4 lbs (At dialysis yesterday, pt reports they pulled a lot of fluid and pt was so tired that he almost got sick) 02/17/2023: Grip strength: Right: 50 lbs; Left: 45  lbs, Lateral pinch: Right: 10  lbs, Left: 17 lbs, 3 point pinch: Right: 9  lbs, Left: 10 lbs, and Tip pinch: Right  8 lbs, Left: 5 lbs 03/05/2023: Grip strength: Right: 50 lbs; Left: 41  lbs, Lateral pinch: Right: 10 lbs, Left: 10 lbs, 3 point pinch: Right: 9  lbs, Left: 7 lbs, and Tip pinch: Right  8 lbs, Left: 7 lbs 04/09/2023: Grip strength: Right: 42 lbs; Left: 35  lbs, Lateral pinch: Right: 10 lbs, Left: 6 lbs, 3 point pinch: Right: 7  lbs, Left: 4 lbs, and Tip pinch: Right  8 lbs, Left: 5 lbs  COORDINATION: 9 Hole Peg test: Right: 36 sec; Left: 56 sec 12/23/2022: 9 Hole Peg test: Right: 32 sec; Left: 45 sec 01/15/23: R hand 26 sec, Left: 43 sec 02/17/23: R hand 39 sec, Left: 53 sec 03/05/23: R hand 36 sec, Left: 50 sec 04/09/23: R hand 38 sec, Left: 45 sec  TODAY'S TREATMENT: 04/16/2023    Therapeutic Exercise:  Facilitated hand strengthening with use of hand gripper set at 28.9# on the L and 28.9# on the R to remove jumbo pegs from pegboard x3 trials each hand. Pt. Worked on translatory  movements moving the pegs form the palm to the tip of the left 2nd digit, and thumb in preparation for placing them into the pegboard. Pt. worked on Marketing executive in the L hand for 3 pt, and lateral pinch using yellow, red, green, and blue resistive clips and lateral pinch for all colors on the R and al Pt. completed 2 trials for each pinch type on each hand. Pt. worked on placing the clips at vertical angles. Verbal cues and demo were required for eliciting the desired movement.   PATIENT  EDUCATION: Education details: BUE strengthening Person educated: Patient Education method: Explanation, vc, demo Education comprehension: verbalized understanding, demonstrated understanding  HOME EXERCISE PROGRAM: Theraputty, tendon glides, intrinsic strengthening with digit abd/add and lumbrical isometric strengthening.  GOALS: Goals reviewed with patient? Yes  SHORT TERM GOALS: Target date:  02/26/2023   Pt will demonstrate ability to hold  and utilize utensils for cutting meat with modified independence. Baseline: difficulty with cutting, stabilizing item with fork for cutting; 01/15/23: pt reports he can cut meat, but it's awkward Goal status: MET  2.  Pt will demonstrate ability to obtain keys from left pocket without difficult and without dropping.   Baseline: pt struggles with reaching into left pocket to get keys; 5/22: still challenging but can do it without dropping Goal status: MET   LONG TERM GOALS: Target date: 05/09/2023   Pt will demonstrate HEP with modified independence.   Baseline: no current program for left UE. (Has green tband but needs educ on exercises); 01/15/23: using theraputty and has recently begun to focus on L hand intrinsic strengthening; 03/05/23 continue to update HEP as he progresses; 04/09/23: continues to use theraputty and focus on intrinsic strengthening Goal status: IN PROGRESS  2.  Pt will improve left UE coordination by 10 secs on 9 hole peg test to demonstrate shoe tying with modified independence.  Baseline: currently wearing slip on shoes and unable to tie shoes, left 9 hole peg test in 56 sec at eval; 01/15/23: pt reports he can tie shoes, sometimes requires multiple attempts, but can do it.  Often chooses to wear slip ons as this is still challenging; L 9 hole 43 sec; 8/14: L 9 hole 45 sec; Pt chooses slip on shoes most of the time as laces require extra time to tie Goal status: MET/ongoing  3.  Pt will demonstrate improved coordination in  left hand to demonstrate buttoning buttons on clothing with modified independence.  Baseline: difficulty with buttons and has been wearing more pullovers to avoid challenge of buttons; 01/15/23: pt reports still challenging Goal status: MET  4.  Pt will demonstrate improvement in bilat grip strength by 5# to open jars and containers with modified independence and greater ease (revised to bilat grip). Baseline: Difficulty with opening jars and containers with use of left hand at eval; 01/15/23: pt reports easier time opening a water bottle top without dicem, but uses dycem to open jars (increased L hand weakness as compared to last progress update, but pt reports increased weakness this week from having excess fluid pulled off him during dialysis and would like to re-measure on a Monday after pt has had a couple days off of dialysis); 04/09/23: uses dycem which helps a great deal; L grip 35 lbs, R 42 lbs Goal status: IN PROGRESS/ongoing  5.  Pt will improve left UE strength by 1 mm grade to hold and hand wash dishes with modified independence.  Baseline: difficulty with being able to hand wash dishes at eval, difficulty stabilizing dish; 01/15/23: easier with dishes, harder with a heavy pan; 04/09/23: LUE strength 5/5 through shoulder, elbow, wrist Goal status: MET  6.  Pt will demonstrate FOTO score of 59 or greater to show a clinically relevant change in LUE to impact greater independence with daily ADL and IADL tasks at home and in the community.   Baseline: Eval: 53; 12/11/22: 55; 01/15/23: 56 03/05/23: score of 60; 04/09/23: FOTO 60 Goal status: MET/ongoing  ASSESSMENT:  CLINICAL IMPRESSION:  Pt. continues to tolerate 3 reps on the hand gripper today to remove jumbo pegs from pegboard, however tolerated increased L hand strength to at 28.9# and continues to perform a R hand strength at 28.9# for all 3 trials. Pt. continues to present with increased difficulty performing blue, and black resistive  clips. Pt. was able to perform smooth translatory movements moving the pegs through  his hand from the palm to the tip of the 2nd digit, and thumb in preparation for setting them up into the pegboard. Pt. continues to be in agreement with plan with continued focus on increasing bilateral hand strength and coordination to more efficiently manipulate ADL supplies.  PERFORMANCE DEFICITS: in functional skills including ADLs, IADLs, coordination, dexterity, sensation, tone, ROM, strength, flexibility, Fine motor control, balance, endurance, decreased knowledge of use of DME, and UE functional use, cognitive skills including, and psychosocial skills including environmental adaptation, habits, and routines and behaviors.   IMPAIRMENTS: are limiting patient from ADLs, IADLs, and social participation.   CO-MORBIDITIES: has co-morbidities such as CAD, ESRD with dialysis, deafness in right ear, diabetic retinopathy, HTN, neuropathy  that affects occupational performance. Patient will benefit from skilled OT to address above impairments and improve overall function.  MODIFICATION OR ASSISTANCE TO COMPLETE EVALUATION: Min-Moderate modification of tasks or assist with assess necessary to complete an evaluation.  OT OCCUPATIONAL PROFILE AND HISTORY: Comprehensive assessment: Review of records and extensive additional review of physical, cognitive, psychosocial history related to current functional performance.  CLINICAL DECISION MAKING: Moderate - several treatment options, min-mod task modification necessary  REHAB POTENTIAL: Good  EVALUATION COMPLEXITY: Moderate PLAN:  OT FREQUENCY: 2x/week  OT DURATION: 4 weeks  PLANNED INTERVENTIONS: self care/ADL training, therapeutic exercise, therapeutic activity, neuromuscular re-education, manual therapy, balance training, paraffin, moist heat, contrast bath, patient/family education, and DME and/or AE instructions  RECOMMENDED OTHER SERVICES: Pt is being seen by  PT currently.   CONSULTED AND AGREED WITH PLAN OF CARE: Patient  PLAN FOR NEXT SESSION: grip, pinch and coordination skills.  Olegario Messier, MS, OTR/L

## 2023-04-21 NOTE — Therapy (Signed)
OUTPATIENT PHYSICAL THERAPY NEURO TREATMENT   Patient Name: Gregory Crane MRN: [id number] DOB:Dec 12, 1970, 52 y.o., male Today's Date: 04/21/2023  PCP: Dale Nobles MD REFERRING PROVIDER: Dale Hobbs MD  PT End of Session - 04/21/23 6295     Visit Number 69    Number of Visits 80    Date for PT Re-Evaluation 04/28/23    Authorization Type Medicare    Authorization Time Period 11/11/22-02/03/23    Authorization - Number of Visits 12    Progress Note Due on Visit 60    PT Start Time 0818    Equipment Utilized During Treatment Gait belt    Activity Tolerance Patient tolerated treatment well;No increased pain    Behavior During Therapy WFL for tasks assessed/performed                    Past Medical History:  Diagnosis Date   Allergy    Anemia    Bell's palsy    Diabetes mellitus without complication (HCC)    diet controlled   Hypertension    Hypothyroidism    Kidney stones    Pseudotumor cerebri    Stroke [facility])    Past Surgical History:  Procedure Laterality Date   COLONOSCOPY WITH PROPOFOL N/A 03/30/2020   Procedure: COLONOSCOPY WITH PROPOFOL;  Surgeon: Regis Bill, MD;  Location: [facility] ENDOSCOPY;  Service: Endoscopy;  Laterality: N/A;   LOOP RECORDER INSERTION N/A 01/27/2018   Procedure: LOOP RECORDER INSERTION;  Surgeon: Duke Salvia, MD;  Location: [facility] INVASIVE CV LAB;  Service: Cardiovascular;  Laterality: N/A;   LUMBAR PUNCTURE     as child   NO PAST SURGERIES     TEE WITHOUT CARDIOVERSION N/A 01/07/2018   Procedure: TRANSESOPHAGEAL ECHOCARDIOGRAM (TEE);  Surgeon: Antonieta Iba, MD;  Location: [facility] ORS;  Service: Cardiovascular;  Laterality: N/A;   Patient Active Problem List   Diagnosis Date Noted   Unsteady gait 10/05/2022   Steal syndrome of dialysis vascular access (HCC) 05/28/2022   Hydronephrosis, left 05/04/2022   Type II diabetes mellitus with renal manifestations (HCC) 05/04/2022   Leukocytosis 05/04/2022   Pre-op  evaluation 04/24/2022   Chest pain 01/13/2022   Diabetic retinopathy associated with diabetes mellitus due to underlying condition (HCC) 01/13/2022   ESRD on hemodialysis (HCC) 01/13/2022   Deafness in right ear 08/11/2021   Headache 07/07/2021   Hearing loss 07/07/2021   Open wound 01/25/2021   History of colon polyps 10/15/2020   Postoperative hemorrhage involving digestive system following digestive system procedure 09/19/2020   Acute cholecystitis without calculus 09/09/2020   Type 2 diabetes mellitus, with long-term current use of insulin (HCC) 09/09/2020   Cryptogenic stroke (HCC) 07/13/2020   History of loop recorder 07/13/2020   Weakness 06/18/2020   History of 2019 novel coronavirus disease (COVID-19) 05/20/2020   ESRD (end stage renal disease) (HCC) 04/04/2020   Pneumonia due to COVID-19 virus 04/03/2020   AKI (acute kidney injury) (HCC) 04/03/2020   Elevated troponin 04/03/2020   Acquired trigger finger 06/08/2019   Lymphedema 06/08/2019   Anemia 04/17/2019   Swelling of left lower extremity 01/10/2019   Facial droop 04/09/2018   Daytime somnolence 03/30/2018   Carotid artery disease (HCC) 02/03/2018   Intracranial vascular stenosis 09/12/2017   Cough 01/20/2017   Bell's palsy 11/10/2016   History of CVA (cerebrovascular accident) 11/10/2016   Benign localized hyperplasia of prostate with urinary obstruction 10/27/2016   History of nephrolithiasis 10/27/2016   TIA (transient ischemic attack) 10/20/2016  Near syncope 06/23/2016   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   Neuropathy 08/06/2015   Health care maintenance 08/06/2015   Essential hypertension 08/06/2015   Heme positive stool 10/10/2013   Hypothyroidism 10/10/2013   Microalbuminuria 10/10/2013   Hyperlipidemia 10/10/2013   B12 deficiency 10/10/2013   Diabetes (HCC) 07/04/2013   Environmental allergies 07/04/2013   ONSET DATE: 2-3 years  REFERRING DIAG: Neuropathy  THERAPY DIAG:  Muscle weakness  (generalized)  Other lack of coordination  Unsteadiness on feet  Abnormality of gait and mobility  Difficulty in walking, not elsewhere classified  Other abnormalities of gait and mobility  Rationale for Evaluation and Treatment Rehabilitation  SUBJECTIVE:                                                                                                                                                                                             SUBJECTIVE STATEMENT: States that he had Chiropractic treatment last week, and states that he has had increased shoulder and neck pain following treatment. Reports that his sleep had been affect since treatment due to tightness in mid/upper back. Pt will be going to Chiropractor today to continue to treatment to manage back/shoulder/neck pain.   Pt rates pain 6/10 in neck Shoulder region.   Pt accompanied by: self    PERTINENT HISTORY: Patient presents to physical therapy for neuropathy. He underwent banding of his L UE fistula on 05/13/22 and scheduled for additional banding on 06/07/22. 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 Patient reports his balance is very unsteady due to neuropathy in feet, feels weak in LE's.   PAIN:  Denies pain, but reports stiffness   PRECAUTIONS: Fall  WEIGHT BEARING RESTRICTIONS Yes no lifting >5 lb in arm   FALLS: Has patient fallen in last 6 months? No  PLOF: Independent  PATIENT GOALS to be more steady and walk as normally as possible.   OBJECTIVE:   TODAY'S TREATMENT:   Unless otherwise stated, CGA was provided and gait belt donned in order to ensure pt safety  TherEx:   Nustep level 2-3 x 5 min .  Lateral Foot tap on 6 inch step x 15 Forward foot tap on x 12 bil  Step up 6 inch step x 6 bil  Sit<>stand x 10  with UE pushing from knee for first 8, then use of BUE on arm rests Ankle DF/PF x 15 AROM BLE x 15   Seated: Heel toe raises 20x  Seated march 20x   NMR:  Foot tap on 6inch cone with BUE support x 15 bil  Standing on airex pad 2 x 1 min intermittent UE support  Cross body reach standing on airex pad x 12 bil   Cues while on airex pad for improved anterior weight shift and weight bearing through forefoot to prevent posterior LOB.   PATIENT EDUCATION: Education details: Pt educated throughout session about proper posture and technique with exercises. Improved exercise technique, movement at target joints, use of target muscles after min to mod verbal, visual, tactile cues.  Person educated: Patient Education method: Explanation, Demonstration, Tactile cues, and Verbal cues Education comprehension: verbalized understanding, returned demonstration, verbal cues required, and tactile cues required  HOME EXERCISE PROGRAM: Access Code: [email or web address] URL: [email or web address] Date: 03/10/2023 Prepared by: Precious Bard Exercises - Seated Heel Toe Raises  - 1 x daily - 7 x weekly - 2 sets - 10 reps - 5 hold - Standing Tandem Balance with Counter Support  - 1 x daily - 7 x weekly - 2 sets - 2 reps - 30 hold - Standing March with Counter Support  - 1 x daily - 7 x weekly - 2 sets - 10 reps - 5 hold - Ankle Inversion Eversion Towel Slide  - 1 x daily - 7 x weekly - 1 sets - 15 reps - 5 hold - Standing Hip Flexor Stretch  - 1 x daily - 7 x weekly - 2 sets - 1 reps - 60 hold - Ankle Rotations in Sitting  - 1 x daily - 7 x weekly - 1 sets - 20 reps - 5 hold - Supine Piriformis Stretch with Leg Straight  - 1 x daily - 7 x weekly - 2 sets - 2 reps - 60 hold   GOALS: Goals reviewed with patient? Yes   SHORT TERM GOALS: Target date: 07/01/2022  Patient will be independent in home exercise program to improve strength/mobility for better functional independence with ADLs. Baseline:10/9; HEP given  2/21: HEP compliant  Goal status: MET   LONG TERM GOALS: Target date:04/28/2023    Patient will increase FOTO score to equal to or greater than   63%  to demonstrate statistically significant improvement in mobility and quality of life.  Baseline: 10/9: 53% 11/29: 58% 12/27: 51% 1/17: 56% 2/21: 53% 3/18: 60%  4/8 58%; 6/26: 60% Goal status: IN PROGRESS  2.  Patient (< 52 years old) will complete five times sit to stand test in < 10 seconds without UE support indicating an increased LE strength and improved balance. Baseline: 10/9: 19.6 seconds with walking stick; one LOB 11/29: 20.56 seconds 12/27: 13.4 seconds hands on knees 1/17: 13 seconds hands on knees  2/21: 14 seconds no hands 3/18: 12 seconds no hands. 4/8 11.3 sec UE on knees. 5/13: 10.94 sec pushing from thighs. 6/24: 10.08 sec, pushing from thighs  Goal status: IN PROGRESS  3.  Patient will increase Berg Balance score by > 45/56 to demonstrate decreased fall risk during functional activities. Baseline: 10/9: 33/56 11/29: 95/28 41/32: 44/01 0/27:  25/36 2/21: 46/56 3/18: 48/56 Goal status: MET  +4.  Patient will increase 10 meter walk test to >1.71m/s as to improve gait speed for better community ambulation and to reduce fall risk. Baseline: 11/29: 1.19 m/s Goal status: MET  5.  Patient will increase ABC scale score >80% to demonstrate better functional mobility and better confidence with ADLs.  Baseline: 10/9: 67% 11/29: 75.6% 12/27: 78%  2/21: 78% 3/18: 80% Goal status: MET  6. Patient will tolerate 5 seconds of single leg stance without loss of balance to improve ability to get in and out of shower safely. Baseline: 11/29: unable to stand on single leg safely 12/27: 2 seconds 1/17: 3 seconds 2/21: 3 seconds 3/18: 4 seconds. 4/8: 3 sec  5/13: LLE x 3 sec and RLE x 4 sec; 6/26:  Goal status: IN PROGRESS   6. Patient will increase six minute walk test distance to >1000 for progression to community ambulator and improve gait  ability Baseline: 12/27: 845 ft with with walking stick 1/17: 714ft 2/21: 930 ft  3/18: 950 ft 4/8: 1113ft. 5/13: 1185 Goal status: met  7. Patient will increase dynamic gait index score to >19/24 as to demonstrate reduced fall risk and improved dynamic gait balance for better safety with community/home ambulation.  Baseline: 3/18:  9/24. 4/8: 16/24 5/13: 18/24 Goal status: in Progress  8. Patient will increase six minute walk test distance to >1500 for progression to community ambulator and improve gait ability Baseline: . 5/13: 1185; 6/26: 1145 with good gait mechanics  Goal status: IN PROGRESS    ASSESSMENT: CLINICAL IMPRESSION:  Pt put forth excellent effort throughout session to address BLE strength and balance deficits. Pt continues to demonstrate poor ankle stability with dynamic balance and poor weight shifting through the forefoot, which improved throughout session with increased repetitions. The pt will continue to benefit from skilled PT to make progress in BLE strength, balance, and coordination to return to PLOF and improve QoL.    OBJECTIVE IMPAIRMENTS Abnormal gait, decreased activity tolerance, decreased balance, decreased coordination, decreased endurance, decreased mobility, difficulty walking, decreased strength, impaired flexibility, impaired sensation, improper body mechanics, and pain.   ACTIVITY LIMITATIONS carrying, lifting, bending, standing, squatting, sleeping, stairs, transfers, bed mobility, bathing, toileting, dressing, locomotion level, and caring for others  PARTICIPATION LIMITATIONS: meal prep, cleaning, laundry, medication management, personal finances, interpersonal relationship, driving, shopping, community activity, and yard work  PERSONAL FACTORS Age, Fitness, Past/current experiences, Time since onset of injury/illness/exacerbation, Transportation, and 3+ comorbidities: CAD, cryptogenic stroke (2021), deafness in R ear, diabetic retinopathy, dialysis,  ESRD, COVID, HTN, hypothyroidism, leukocytosis, nephrolithiasis, neuropathy, Pseudotumor cerebri 1986, sepsis, DM type I, DM type II  are also affecting patient's functional outcome.   REHAB POTENTIAL: Good  CLINICAL DECISION MAKING: Evolving/moderate complexity  EVALUATION COMPLEXITY: Moderate  PLAN: PT FREQUENCY: 2x/week  PT DURATION: 12 weeks  PLANNED INTERVENTIONS: Therapeutic exercises, Therapeutic activity, Neuromuscular re-education, Balance training, Gait training, Patient/Family education, Self Care, Joint mobilization, Stair training, Vestibular training, Canalith repositioning, Visual/preceptual remediation/compensation, DME instructions, Dry Needling, Cognitive remediation, Spinal mobilization, Cryotherapy, Moist heat, Manual lymph drainage, Compression bandaging, Taping, Vasopneumatic device, Ultrasound, Manual therapy, and Re-evaluation  PLAN FOR NEXT SESSION:   Continue to address ankle and hip weakness.  Address mid/upper back pain as needed.    Golden Pop PT ,DPT Physical Therapist- [place]  [facility]

## 2023-04-23 ENCOUNTER — Ambulatory Visit: Payer: Medicare Other

## 2023-04-23 DIAGNOSIS — R269 Unspecified abnormalities of gait and mobility: Secondary | ICD-10-CM

## 2023-04-23 DIAGNOSIS — M6281 Muscle weakness (generalized): Secondary | ICD-10-CM

## 2023-04-23 DIAGNOSIS — R2681 Unsteadiness on feet: Secondary | ICD-10-CM

## 2023-04-23 DIAGNOSIS — R262 Difficulty in walking, not elsewhere classified: Secondary | ICD-10-CM

## 2023-04-23 DIAGNOSIS — R278 Other lack of coordination: Secondary | ICD-10-CM

## 2023-04-23 NOTE — Therapy (Signed)
OCCUPATIONAL THERAPY NEURO TREATMENT NOTE  Patient Name: Gregory Crane MRN: [id number] DOB:07-Nov-1970, 52 y.o., male  PCP: Dale Golva REFERRING PROVIDER: Dale Temelec  END OF SESSION:  OT End of Session - 04/25/23 2128     Visit Number 37    Number of Visits 61    Date for OT Re-Evaluation 05/09/23    Progress Note Due on Visit 10    OT Start Time 1100    OT Stop Time 1145    OT Time Calculation (min) 45 min    Activity Tolerance Patient tolerated treatment well    Behavior During Therapy WFL for tasks assessed/performed               Past Medical History:  Diagnosis Date   Allergy    Anemia    Bell's palsy    Diabetes mellitus without complication (HCC)    diet controlled   Hypertension    Hypothyroidism    Kidney stones    Pseudotumor cerebri    Stroke [facility])    Past Surgical History:  Procedure Laterality Date   COLONOSCOPY WITH PROPOFOL N/A 03/30/2020   Procedure: COLONOSCOPY WITH PROPOFOL;  Surgeon: Regis Bill, MD;  Location: [facility] ENDOSCOPY;  Service: Endoscopy;  Laterality: N/A;   LOOP RECORDER INSERTION N/A 01/27/2018   Procedure: LOOP RECORDER INSERTION;  Surgeon: Duke Salvia, MD;  Location: [facility] INVASIVE CV LAB;  Service: Cardiovascular;  Laterality: N/A;   LUMBAR PUNCTURE     as child   NO PAST SURGERIES     TEE WITHOUT CARDIOVERSION N/A 01/07/2018   Procedure: TRANSESOPHAGEAL ECHOCARDIOGRAM (TEE);  Surgeon: Antonieta Iba, MD;  Location: [facility] ORS;  Service: Cardiovascular;  Laterality: N/A;   Patient Active Problem List   Diagnosis Date Noted   Unsteady gait 10/05/2022   Steal syndrome of dialysis vascular access (HCC) 05/28/2022   Hydronephrosis, left 05/04/2022   Type II diabetes mellitus with renal manifestations (HCC) 05/04/2022   Leukocytosis 05/04/2022   Pre-op evaluation 04/24/2022   Chest pain 01/13/2022   Diabetic retinopathy associated with diabetes mellitus due to underlying condition (HCC) 01/13/2022   ESRD on  hemodialysis (HCC) 01/13/2022   Deafness in right ear 08/11/2021   Headache 07/07/2021   Hearing loss 07/07/2021   Open wound 01/25/2021   History of colon polyps 10/15/2020   Postoperative hemorrhage involving digestive system following digestive system procedure 09/19/2020   Acute cholecystitis without calculus 09/09/2020   Type 2 diabetes mellitus, with long-term current use of insulin (HCC) 09/09/2020   Cryptogenic stroke (HCC) 07/13/2020   History of loop recorder 07/13/2020   Weakness 06/18/2020   History of 2019 novel coronavirus disease (COVID-19) 05/20/2020   ESRD (end stage renal disease) (HCC) 04/04/2020   Pneumonia due to COVID-19 virus 04/03/2020   AKI (acute kidney injury) (HCC) 04/03/2020   Elevated troponin 04/03/2020   Acquired trigger finger 06/08/2019   Lymphedema 06/08/2019   Anemia 04/17/2019   Swelling of left lower extremity 01/10/2019   Facial droop 04/09/2018   Daytime somnolence 03/30/2018   Carotid artery disease (HCC) 02/03/2018   Intracranial vascular stenosis 09/12/2017   Cough 01/20/2017   Bell's palsy 11/10/2016   History of CVA (cerebrovascular accident) 11/10/2016   Benign localized hyperplasia of prostate with urinary obstruction 10/27/2016   History of nephrolithiasis 10/27/2016   TIA (transient ischemic attack) 10/20/2016   Near syncope 06/23/2016   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   Neuropathy 08/06/2015   Health care maintenance 08/06/2015   Essential hypertension 08/06/2015  Heme positive stool 10/10/2013   Hypothyroidism 10/10/2013   Microalbuminuria 10/10/2013   Hyperlipidemia 10/10/2013   B12 deficiency 10/10/2013   Diabetes (HCC) 07/04/2013   Environmental allergies 07/04/2013   ONSET DATE: May 2023  REFERRING DIAG: CVA  THERAPY DIAG:  Muscle weakness (generalized)  Other lack of coordination  Rationale for Evaluation and Treatment: Rehabilitation  SUBJECTIVE:  SUBJECTIVE STATEMENT: Pt reports he's feeling a little more  tired today, but otherwise doing alright.  PT reports BP slightly elevated following 6 MWT at 174/80.  OT checked end of session and noted 170/72.  Encouraged pt continue to monitor at home and call MD if symptomatic and or if BP remains elevated.  Pt verbalized understanding.  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    PRECAUTIONS: Fall and Other: No strengthening exercises greater than 5-10 pounds in left arm.   Dialysis Tues, Th, Sat.  WEIGHT BEARING RESTRICTIONS: No  PAIN:  Are you having pain? No  FALLS: Has patient fallen in last 6 months? No  LIVING ENVIRONMENT: Lives with: alone Lives in: House/apartment Stairs: Yes: Internal: 15 steps; on right going up Has following equipment at home: Single point cane and Walker - 2 wheeled  PLOF: Independent  PATIENT GOALS: Pt reports he wants to have better use of his left hand, be able to do buttons and shoes.   OBJECTIVE:   HAND DOMINANCE: Right  ADLs: Overall ADLs: modified independent with most tasks Transfers/ambulation related to ADLs: Now walking without an assistive device, used cane in the past.  Eating: Independent with self feeding, difficulty with cutting food, requires increased focus and effort. Grooming: Independent UB Dressing: Difficulty with buttons, uses pullovers most of the time  LB Dressing: Difficulty with tying shoes Toileting: Independent Bathing: Independent Tub Shower transfers: Independent Equipment: none  IADLs: Shopping: independent,  uses a cart to push in the store Light housekeeping: modified  independent Meal Prep: difficulty with tasks requiring use of left hand, bilateral hand use.   Community mobility: Ambulates without use of assistive device, able to drive Medication management: Independent Financial management: Independent Handwriting: 90% legible  MOBILITY STATUS: Needs Assist: Pt has progressed with PT, used to use a cane but ambulating without an assistive device but still has balance difficulties.    POSTURE COMMENTS:    Sitting balance: Sits without UE support up to 30 sec  ACTIVITY TOLERANCE: Activity tolerance: limited activity tolerance  FUNCTIONAL OUTCOME MEASURES: FOTO: 53 12/11/22: FOTO: 55 01/15/23: 56 04/09/23: 60  UPPER EXTREMITY ROM:    Active ROM Right eval Left eval  Shoulder flexion [facility]  Shoulder abduction [facility]  Shoulder adduction [facility]  Shoulder extension [facility]  Shoulder internal rotation [facility]  Shoulder external rotation [facility] WFL  Elbow flexion [facility] WFL  Elbow extension [facility]  Wrist flexion [facility] WFL  Wrist extension [facility] WFL  Wrist ulnar deviation [facility] WFL  Wrist radial deviation [facility] WFL  Wrist pronation [facility] WFL  Wrist supination WFL WFL  (Blank rows = not tested)  UPPER EXTREMITY MMT:     MMT Right eval Left eval Left 01/15/23 Left 04/09/23  Shoulder  flexion 5/5 3+/5 4+ 5  Shoulder abduction 5/5 3+/5 4+ 5  Shoulder adduction      Shoulder extension      Shoulder internal rotation      Shoulder external rotation      Middle trapezius      Lower trapezius      Elbow flexion 5/5 3+/5 5 5   Elbow extension 5/5 3+/5 4+ 5  Wrist flexion 5/5 3+/5 4+ 5  Wrist extension 5/5 3+/5 4+ 5  Wrist ulnar deviation 5/5 3+/5 4+ 5  Wrist radial deviation 5/5 3+/5 4+ 5  Wrist pronation 5/5 3+/5 4+ 5  Wrist supination 5/5 3+/5 4+ 5  (Blank rows = not tested)  Opposition of thumb to all digits on right, on left hand opposition to ring finger but cannot demonstrate to small finger.   HAND FUNCTION: Grip strength: Right: 44 lbs;  Left: 35 lbs, Lateral pinch: Right: 17 lbs, Left: 16 lbs, 3 point pinch: Right: 14 lbs, Left: 0 lbs, and Tip pinch: Right 2 lbs, Left: 0 lbs 12/23/2022: Grip strength: Right: 50 lbs; Left: 43 lbs, Lateral pinch: Right: 10 lbs, Left: 8 lbs, 3 point pinch: Right:  9 lbs, Left: 8 lbs, and Tip pinch: Right 8 lbs, Left: 4 lbs 01/15/23: R grip 45 lbs, Left: 34 lbs; R lateral pinch 10 lbs, L 7 lbs; 3 point pinch R: 7 lbs, L 7 lbs; tip pinch R: 8 lbs, L 4 lbs (At dialysis yesterday, pt reports they pulled a lot of fluid and pt was so tired that he almost got sick) 02/17/2023: Grip strength: Right: 50 lbs; Left: 45  lbs, Lateral pinch: Right: 10  lbs, Left: 17 lbs, 3 point pinch: Right: 9  lbs, Left: 10 lbs, and Tip pinch: Right  8 lbs, Left: 5 lbs 03/05/2023: Grip strength: Right: 50 lbs; Left: 41  lbs, Lateral pinch: Right: 10 lbs, Left: 10 lbs, 3 point pinch: Right: 9  lbs, Left: 7 lbs, and Tip pinch: Right  8 lbs, Left: 7 lbs 04/09/2023: Grip strength: Right: 42 lbs; Left: 35  lbs, Lateral pinch: Right: 10 lbs, Left: 6 lbs, 3 point pinch: Right: 7  lbs, Left: 4 lbs, and Tip pinch: Right  8 lbs, Left: 5 lbs  COORDINATION: 9 Hole Peg test: Right: 36 sec; Left: 56 sec 12/23/2022: 9 Hole Peg test: Right: 32 sec; Left: 45 sec 01/15/23: R hand 26 sec, Left: 43 sec 02/17/23: R hand 39 sec, Left: 53 sec 03/05/23: R hand 36 sec, Left: 50 sec 04/09/23: R hand 38 sec, Left: 45 sec  TODAY'S TREATMENT: 04/23/2023   Therapeutic Exercise: Facilitated hand strengthening with use of hand gripper set at 28.9# on the R for 3 trials, and 28.9# on the L for 1 trial, decreasing to 23.4# on the L for 2 more trials to remove jumbo pegs from pegboard.  Both hands with frequent dropping and unable to fully grasp pegs on either setting on either hand without securing pegs partially with fingers rather than sustaining grip with hand gripper.  Pt worked on Copy[employer], advertising tools with yellow therputty using the knob turn,  cap turn, peg turn, L-Bar and key turn attachments to target specific components of movements in R/L hands.  PATIENT EDUCATION: Education details: BUE strengthening Person educated: Patient Education method: Explanation, vc, demo Education comprehension: verbalized understanding, demonstrated understanding  HOME EXERCISE PROGRAM: Theraputty, tendon glides, intrinsic strengthening with digit abd/add and lumbrical isometric strengthening.  GOALS: Goals reviewed with patient? Yes  SHORT TERM GOALS: Target date:  02/26/2023   Pt will demonstrate ability to hold and utilize utensils for cutting meat with modified independence. Baseline: difficulty with cutting, stabilizing item with fork for cutting; 01/15/23: pt reports he can cut meat, but it's awkward Goal status: MET  2.  Pt will demonstrate ability to obtain keys from left pocket without difficult and without dropping.   Baseline: pt struggles with reaching into left pocket to get keys; 5/22: still challenging but can do it without dropping Goal status: MET   LONG TERM GOALS: Target date: 05/09/2023   Pt will demonstrate HEP with modified independence.   Baseline: no current program for left UE. (Has green tband but needs educ on exercises); 01/15/23: using theraputty and has recently begun to focus on L hand intrinsic strengthening; 03/05/23 continue to update HEP as he progresses; 04/09/23: continues to use theraputty and focus on intrinsic strengthening Goal status: IN PROGRESS  2.  Pt will improve left UE coordination by 10 secs on 9 hole peg test to demonstrate shoe tying with modified independence.  Baseline: currently wearing slip on shoes and unable to tie shoes, left 9 hole peg test in 56 sec at eval; 01/15/23: pt reports he can tie shoes, sometimes requires multiple attempts, but can do it.  Often chooses to wear slip ons as this is still challenging; L 9 hole 43 sec; 8/14: L 9 hole 45 sec; Pt chooses slip on shoes most of the  time as laces require extra time to tie Goal status: MET/ongoing  3.  Pt will demonstrate improved coordination in left hand to demonstrate buttoning buttons on clothing with modified independence.  Baseline: difficulty with buttons and has been wearing more pullovers to avoid challenge of buttons; 01/15/23: pt reports still challenging Goal status: MET  4.  Pt will demonstrate improvement in bilat grip strength by 5# to open jars and containers with modified independence and greater ease (revised to bilat grip). Baseline: Difficulty with opening jars and containers with use of left hand at eval; 01/15/23: pt reports easier time opening a water bottle top without dicem, but uses dycem to open jars (increased L hand weakness as compared to last progress update, but pt reports increased weakness this week from having excess fluid pulled off him during dialysis and would like to re-measure on a Monday after pt has had a couple days off of dialysis); 04/09/23: uses dycem which helps a great deal; L grip 35 lbs, R 42 lbs Goal status: IN PROGRESS/ongoing  5.  Pt will improve left UE strength by 1 mm grade to hold and hand wash dishes with modified independence.  Baseline: difficulty with being able to hand wash dishes at eval, difficulty stabilizing dish; 01/15/23: easier with dishes, harder with a heavy pan; 04/09/23: LUE strength 5/5 through shoulder, elbow, wrist Goal status: MET  6.  Pt will demonstrate FOTO score of 59 or greater to show a clinically relevant change in LUE to impact greater independence with daily ADL and IADL tasks at home and in the community.   Baseline: Eval: 53; 12/11/22: 55; 01/15/23: 56 03/05/23: score of 60; 04/09/23: FOTO 60 Goal status: MET/ongoing  ASSESSMENT:  CLINICAL IMPRESSION: Decreased hand gripper setting from 28.9# to 23.4# on the L hand today d/t frequent dropping at the stronger setting.  Both hands with frequent dropping and unable to fully grasp pegs on either  setting on either hand without securing pegs partially with fingers rather than sustaining grip with hand gripper.  Pt worked with puttycise tools with the yellow light resistance putty, requiring less resistance for the L hand and occasional assist to turn tools, particularly when using more narrow base tools that target intrinsics of the hand.  Pt. continues to be in agreement with plan with continued focus on increasing bilateral hand strength and coordination to more efficiently manipulate ADL supplies.  PERFORMANCE DEFICITS: in functional skills including ADLs, IADLs, coordination, dexterity, sensation, tone, ROM, strength, flexibility, Fine motor control, balance, endurance, decreased knowledge of use of DME, and UE functional use, cognitive skills including, and psychosocial skills including environmental adaptation, habits, and routines and behaviors.   IMPAIRMENTS: are limiting patient from ADLs, IADLs, and social participation.   CO-MORBIDITIES: has co-morbidities such as CAD, ESRD with dialysis, deafness in right ear, diabetic retinopathy, HTN, neuropathy  that affects occupational performance. Patient will benefit from skilled OT to address above impairments and improve overall function.  MODIFICATION OR ASSISTANCE TO COMPLETE EVALUATION: Min-Moderate modification of tasks or assist with assess necessary to complete an evaluation.  OT OCCUPATIONAL PROFILE AND HISTORY: Comprehensive assessment: Review of records and extensive additional review of physical, cognitive, psychosocial history related to current functional performance.  CLINICAL DECISION MAKING: Moderate - several treatment options, min-mod task modification necessary  REHAB POTENTIAL: Good  EVALUATION COMPLEXITY: Moderate PLAN:  OT FREQUENCY: 2x/week  OT DURATION: 4 weeks  PLANNED INTERVENTIONS: self care/ADL training, therapeutic exercise, therapeutic activity, neuromuscular re-education, manual therapy, balance  training, paraffin, moist heat, contrast bath, patient/family education, and DME and/or AE instructions  RECOMMENDED OTHER SERVICES: Pt is being seen by PT currently.   CONSULTED AND AGREED WITH PLAN OF CARE: Patient  PLAN FOR NEXT SESSION: grip, pinch and coordination skills.  Danelle Earthly, MS, OTR/L

## 2023-04-23 NOTE — Therapy (Signed)
OUTPATIENT PHYSICAL THERAPY NEURO TREATMENT /Physical Therapy Progress Note   Dates of reporting period  02/19/23   to   04/23/23     Patient Name: Gregory Crane MRN: [id number] DOB:1971-07-04, 52 y.o., male Today's Date: 04/23/2023  PCP: Dale Roger Mills MD REFERRING PROVIDER: Dale  MD  PT End of Session - 04/23/23 1015     Visit Number 70    Number of Visits 80    Date for PT Re-Evaluation 04/28/23    Authorization Type Medicare    Authorization Time Period 11/11/22-02/03/23    Authorization - Number of Visits 12    Progress Note Due on Visit 60    PT Start Time 1015    PT Stop Time 1057    PT Time Calculation (min) 42 min    Equipment Utilized During Treatment Gait belt    Activity Tolerance Patient tolerated treatment well;No increased pain    Behavior During Therapy WFL for tasks assessed/performed                     Past Medical History:  Diagnosis Date   Allergy    Anemia    Bell's palsy    Diabetes mellitus without complication (HCC)    diet controlled   Hypertension    Hypothyroidism    Kidney stones    Pseudotumor cerebri    Stroke [facility])    Past Surgical History:  Procedure Laterality Date   COLONOSCOPY WITH PROPOFOL N/A 03/30/2020   Procedure: COLONOSCOPY WITH PROPOFOL;  Surgeon: Regis Bill, MD;  Location: [facility] ENDOSCOPY;  Service: Endoscopy;  Laterality: N/A;   LOOP RECORDER INSERTION N/A 01/27/2018   Procedure: LOOP RECORDER INSERTION;  Surgeon: Duke Salvia, MD;  Location: [facility] INVASIVE CV LAB;  Service: Cardiovascular;  Laterality: N/A;   LUMBAR PUNCTURE     as child   NO PAST SURGERIES     TEE WITHOUT CARDIOVERSION N/A 01/07/2018   Procedure: TRANSESOPHAGEAL ECHOCARDIOGRAM (TEE);  Surgeon: Antonieta Iba, MD;  Location: [facility] ORS;  Service: Cardiovascular;  Laterality: N/A;   Patient Active Problem List   Diagnosis Date Noted   Unsteady gait 10/05/2022   Steal syndrome of dialysis vascular access (HCC)  05/28/2022   Hydronephrosis, left 05/04/2022   Type II diabetes mellitus with renal manifestations (HCC) 05/04/2022   Leukocytosis 05/04/2022   Pre-op evaluation 04/24/2022   Chest pain 01/13/2022   Diabetic retinopathy associated with diabetes mellitus due to underlying condition (HCC) 01/13/2022   ESRD on hemodialysis (HCC) 01/13/2022   Deafness in right ear 08/11/2021   Headache 07/07/2021   Hearing loss 07/07/2021   Open wound 01/25/2021   History of colon polyps 10/15/2020   Postoperative hemorrhage involving digestive system following digestive system procedure 09/19/2020   Acute cholecystitis without calculus 09/09/2020   Type 2 diabetes mellitus, with long-term current use of insulin (HCC) 09/09/2020   Cryptogenic stroke (HCC) 07/13/2020   History of loop recorder 07/13/2020   Weakness 06/18/2020   History of 2019 novel coronavirus disease (COVID-19) 05/20/2020   ESRD (end stage renal disease) (HCC) 04/04/2020   Pneumonia due to COVID-19 virus 04/03/2020   AKI (acute kidney injury) (HCC) 04/03/2020   Elevated troponin 04/03/2020   Acquired trigger finger 06/08/2019   Lymphedema 06/08/2019   Anemia 04/17/2019   Swelling of left lower extremity 01/10/2019   Facial droop 04/09/2018   Daytime somnolence 03/30/2018   Carotid artery disease (HCC) 02/03/2018   Intracranial vascular stenosis 09/12/2017   Cough 01/20/2017  Bell's palsy 11/10/2016   History of CVA (cerebrovascular accident) 11/10/2016   Benign localized hyperplasia of prostate with urinary obstruction 10/27/2016   History of nephrolithiasis 10/27/2016   TIA (transient ischemic attack) 10/20/2016   Near syncope 06/23/2016   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   Neuropathy 08/06/2015   Health care maintenance 08/06/2015   Essential hypertension 08/06/2015   Heme positive stool 10/10/2013   Hypothyroidism 10/10/2013   Microalbuminuria 10/10/2013   Hyperlipidemia 10/10/2013   B12 deficiency 10/10/2013   Diabetes  (HCC) 07/04/2013   Environmental allergies 07/04/2013   ONSET DATE: 2-3 years  REFERRING DIAG: Neuropathy  THERAPY DIAG:  Muscle weakness (generalized)  Unsteadiness on feet  Abnormality of gait and mobility  Difficulty in walking, not elsewhere classified  Rationale for Evaluation and Treatment Rehabilitation  SUBJECTIVE:                                                                                                                                                                                             SUBJECTIVE STATEMENT: Patient feels he has reached plateau.      Pt accompanied by: self    PERTINENT HISTORY: Patient presents to physical therapy for neuropathy. He underwent banding of his L UE fistula on 05/13/22 and scheduled for additional banding on 06/07/22. 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 Patient reports his balance is very unsteady due to neuropathy in feet, feels weak in LE's.   PAIN:  Denies pain, but reports stiffness   PRECAUTIONS: Fall  WEIGHT BEARING RESTRICTIONS Yes no lifting >5 lb in arm   FALLS: Has patient fallen in last 6 months? No  PLOF: Independent  PATIENT GOALS to be more steady and walk as normally as possible.   OBJECTIVE:   TODAY'S TREATMENT:   Unless otherwise stated, CGA was provided and gait belt donned in order to ensure pt safety  BP: 181/75 after 6 minute walk test: 5 minutes later:  174/80   Goals performed: see below:      PATIENT EDUCATION: Education details: Pt educated throughout session about proper posture and technique with exercises. Improved exercise technique, movement at target joints, use of target muscles after min to mod verbal, visual, tactile cues.  Person educated: Patient Education method: Explanation,  Demonstration, Tactile cues, and Verbal cues Education comprehension: verbalized understanding, returned demonstration, verbal cues required, and tactile cues required  HOME EXERCISE PROGRAM: Access Code: [email or web address] URL: [email or web address] Date: 03/10/2023 Prepared by: Precious Bard Exercises - Seated Heel Toe Raises  -  1 x daily - 7 x weekly - 2 sets - 10 reps - 5 hold - Standing Tandem Balance with Counter Support  - 1 x daily - 7 x weekly - 2 sets - 2 reps - 30 hold - Standing March with Counter Support  - 1 x daily - 7 x weekly - 2 sets - 10 reps - 5 hold - Ankle Inversion Eversion Towel Slide  - 1 x daily - 7 x weekly - 1 sets - 15 reps - 5 hold - Standing Hip Flexor Stretch  - 1 x daily - 7 x weekly - 2 sets - 1 reps - 60 hold - Ankle Rotations in Sitting  - 1 x daily - 7 x weekly - 1 sets - 20 reps - 5 hold - Supine Piriformis Stretch with Leg Straight  - 1 x daily - 7 x weekly - 2 sets - 2 reps - 60 hold   GOALS: Goals reviewed with patient? Yes   SHORT TERM GOALS: Target date: 07/01/2022  Patient will be independent in home exercise program to improve strength/mobility for better functional independence with ADLs. Baseline:10/9; HEP given 2/21: HEP compliant  Goal status: MET   LONG TERM GOALS: Target date:04/28/2023    Patient will increase FOTO score to equal to or greater than   63%  to demonstrate statistically significant improvement in mobility and quality of life.  Baseline: 10/9: 53% 11/29: 58% 12/27: 51% 1/17: 56% 2/21: 53% 3/18: 60%  4/8 58%; 6/26: 60% 8/28: 62%  Goal status: IN PROGRESS  2.  Patient (< 20 years old) will complete five times sit to stand test in < 10 seconds without UE support indicating an increased LE strength and improved balance. Baseline: 10/9: 19.6 seconds with walking stick; one LOB 11/29: 20.56 seconds 12/27: 13.4 seconds hands on knees 1/17: 13 seconds hands on knees  2/21: 14 seconds no hands 3/18: 12 seconds no hands. 4/8 11.3 sec UE  on knees. 5/13: 10.94 sec pushing from thighs. 6/24: 10.08 sec, pushing from thighs 8/28:  11.9 seconds pushing from thighs  Goal status: IN PROGRESS  3.  Patient will increase Berg Balance score by > 45/56 to demonstrate decreased fall risk during functional activities. Baseline: 10/9: 33/56 11/29: 16/10 96/04: 54/09 8/11:  91/47 2/21: 46/56 3/18: 48/56 Goal status: MET  +4.  Patient will increase 10 meter walk test to >1.58m/s as to improve gait speed for better community ambulation and to reduce fall risk. Baseline: 11/29: 1.19 m/s Goal status: MET  5.  Patient will increase ABC scale score >80% to demonstrate better functional mobility and better confidence with ADLs.  Baseline: 10/9: 67% 11/29: 75.6% 12/27: 78%  2/21: 78% 3/18: 80% Goal status: MET  6. Patient will tolerate 5 seconds of single leg stance without loss of balance to improve ability to get in and out of shower safely. Baseline: 11/29: unable to stand on single leg safely 12/27: 2 seconds 1/17: 3 seconds 2/21: 3 seconds 3/18: 4 seconds. 4/8: 3 sec  5/13: LLE x 3 sec and RLE x 4 sec; 6/26:  Goal status: IN PROGRESS   6. Patient will increase six minute walk test distance to >1000 for progression to community ambulator and improve gait ability Baseline: 12/27: 845 ft with with walking stick 1/17: 730ft 2/21: 930 ft  3/18: 950 ft 4/8: 1113ft. 5/13: 1185 Goal status: met  7. Patient will increase dynamic gait index score to >19/24 as to demonstrate reduced fall risk and  improved dynamic gait balance for better safety with community/home ambulation.  Baseline: 3/18:  9/24. 4/8: 16/24 5/13: 18/24 8/28: 19/24 Goal status: in Progress  8. Patient will increase six minute walk test distance to >1500 for progression to community ambulator and improve gait ability Baseline: . 5/13: 1185; 6/26: 1145 with good gait mechanics 8/28: 967 ft  Goal status: IN PROGRESS    ASSESSMENT: CLINICAL IMPRESSION: Patient's goals performed,  discussed patient's platea and slight regression. Patient agreeable that he is at plateau and will join a gym.  Next session will be discharge.    OBJECTIVE IMPAIRMENTS Abnormal gait, decreased activity tolerance, decreased balance, decreased coordination, decreased endurance, decreased mobility, difficulty walking, decreased strength, impaired flexibility, impaired sensation, improper body mechanics, and pain.   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   REHAB POTENTIAL: Good  CLINICAL DECISION MAKING: Evolving/moderate complexity  EVALUATION COMPLEXITY: Moderate  PLAN: PT FREQUENCY: 2x/week  PT DURATION: 12 weeks  PLANNED INTERVENTIONS: Therapeutic exercises, Therapeutic activity, Neuromuscular re-education, Balance training, Gait training, Patient/Family education, Self Care, Joint mobilization, Stair training, Vestibular training, Canalith repositioning, Visual/preceptual remediation/compensation, DME instructions, Dry Needling, Cognitive remediation, Spinal mobilization, Cryotherapy, Moist heat, Manual lymph drainage, Compression bandaging, Taping, Vasopneumatic device, Ultrasound, Manual therapy, and Re-evaluation  PLAN FOR NEXT SESSION:  Discharge    Precious Bard PT ,DPT Physical Therapist- [place]  [facility]

## 2023-04-27 ENCOUNTER — Other Ambulatory Visit: Payer: Self-pay | Admitting: Internal Medicine

## 2023-04-28 ENCOUNTER — Other Ambulatory Visit: Payer: Self-pay

## 2023-04-28 ENCOUNTER — Inpatient Hospital Stay
Admission: EM | Admit: 2023-04-28 | Discharge: 2023-05-01 | DRG: 640 | Discharge status: Against Medical Advice | Payer: Medicare Other | Attending: Internal Medicine | Admitting: Internal Medicine

## 2023-04-28 ENCOUNTER — Emergency Department: Payer: Medicare Other

## 2023-04-28 ENCOUNTER — Encounter: Payer: Self-pay | Admitting: Emergency Medicine

## 2023-04-28 DIAGNOSIS — M7989 Other specified soft tissue disorders: Secondary | ICD-10-CM | POA: Diagnosis present

## 2023-04-28 DIAGNOSIS — E872 Acidosis, unspecified: Secondary | ICD-10-CM | POA: Diagnosis not present

## 2023-04-28 DIAGNOSIS — E1122 Type 2 diabetes mellitus with diabetic chronic kidney disease: Secondary | ICD-10-CM | POA: Diagnosis present

## 2023-04-28 DIAGNOSIS — N2581 Secondary hyperparathyroidism of renal origin: Secondary | ICD-10-CM | POA: Diagnosis present

## 2023-04-28 DIAGNOSIS — Z5329 Procedure and treatment not carried out because of patient's decision for other reasons: Secondary | ICD-10-CM | POA: Diagnosis present

## 2023-04-28 DIAGNOSIS — Z794 Long term (current) use of insulin: Secondary | ICD-10-CM

## 2023-04-28 DIAGNOSIS — Z992 Dependence on renal dialysis: Secondary | ICD-10-CM

## 2023-04-28 DIAGNOSIS — Z803 Family history of malignant neoplasm of breast: Secondary | ICD-10-CM

## 2023-04-28 DIAGNOSIS — I10 Essential (primary) hypertension: Secondary | ICD-10-CM | POA: Diagnosis present

## 2023-04-28 DIAGNOSIS — E538 Deficiency of other specified B group vitamins: Secondary | ICD-10-CM | POA: Diagnosis present

## 2023-04-28 DIAGNOSIS — D649 Anemia, unspecified: Secondary | ICD-10-CM | POA: Diagnosis present

## 2023-04-28 DIAGNOSIS — I120 Hypertensive chronic kidney disease with stage 5 chronic kidney disease or end stage renal disease: Secondary | ICD-10-CM | POA: Diagnosis present

## 2023-04-28 DIAGNOSIS — Z6838 Body mass index (BMI) 38.0-38.9, adult: Secondary | ICD-10-CM

## 2023-04-28 DIAGNOSIS — E785 Hyperlipidemia, unspecified: Secondary | ICD-10-CM | POA: Diagnosis present

## 2023-04-28 DIAGNOSIS — Z8673 Personal history of transient ischemic attack (TIA), and cerebral infarction without residual deficits: Secondary | ICD-10-CM

## 2023-04-28 DIAGNOSIS — Z7982 Long term (current) use of aspirin: Secondary | ICD-10-CM

## 2023-04-28 DIAGNOSIS — Z87442 Personal history of urinary calculi: Secondary | ICD-10-CM

## 2023-04-28 DIAGNOSIS — E119 Type 2 diabetes mellitus without complications: Secondary | ICD-10-CM

## 2023-04-28 DIAGNOSIS — E669 Obesity, unspecified: Secondary | ICD-10-CM | POA: Diagnosis present

## 2023-04-28 DIAGNOSIS — Z79899 Other long term (current) drug therapy: Secondary | ICD-10-CM

## 2023-04-28 DIAGNOSIS — E8770 Fluid overload, unspecified: Secondary | ICD-10-CM | POA: Diagnosis not present

## 2023-04-28 DIAGNOSIS — Z8261 Family history of arthritis: Secondary | ICD-10-CM

## 2023-04-28 DIAGNOSIS — Z91158 Patient's noncompliance with renal dialysis for other reason: Secondary | ICD-10-CM

## 2023-04-28 DIAGNOSIS — Z833 Family history of diabetes mellitus: Secondary | ICD-10-CM

## 2023-04-28 DIAGNOSIS — D631 Anemia in chronic kidney disease: Secondary | ICD-10-CM | POA: Diagnosis present

## 2023-04-28 DIAGNOSIS — Z79890 Hormone replacement therapy (postmenopausal): Secondary | ICD-10-CM

## 2023-04-28 DIAGNOSIS — I160 Hypertensive urgency: Secondary | ICD-10-CM | POA: Diagnosis present

## 2023-04-28 DIAGNOSIS — N186 End stage renal disease: Secondary | ICD-10-CM

## 2023-04-28 DIAGNOSIS — E039 Hypothyroidism, unspecified: Secondary | ICD-10-CM | POA: Diagnosis present

## 2023-04-28 DIAGNOSIS — J9811 Atelectasis: Secondary | ICD-10-CM | POA: Diagnosis present

## 2023-04-28 LAB — BASIC METABOLIC PANEL
Anion gap: 12 (ref 5–15)
BUN: 70 mg/dL — ABNORMAL HIGH (ref 6–20)
CO2: 15 mmol/L — ABNORMAL LOW (ref 22–32)
Calcium: 7 mg/dL — ABNORMAL LOW (ref 8.9–10.3)
Chloride: 113 mmol/L — ABNORMAL HIGH (ref 98–111)
Creatinine, Ser: 9.79 mg/dL — ABNORMAL HIGH (ref 0.61–1.24)
GFR, Estimated: 6 mL/min — ABNORMAL LOW (ref >60)
Glucose, Bld: 188 mg/dL — ABNORMAL HIGH (ref 70–99)
Potassium: 3.8 mmol/L (ref 3.5–5.1)
Sodium: 140 mmol/L (ref 135–145)

## 2023-04-28 LAB — HEPATIC FUNCTION PANEL
ALT: 10 U/L (ref 0–44)
AST: 13 U/L — ABNORMAL LOW (ref 15–41)
Albumin: 2.6 g/dL — ABNORMAL LOW (ref 3.5–5.0)
Alkaline Phosphatase: 77 U/L (ref 38–126)
Bilirubin, Direct: <0.1 mg/dL (ref 0.0–0.2)
Total Bilirubin: 0.5 mg/dL (ref 0.3–1.2)
Total Protein: 6.5 g/dL (ref 6.5–8.1)

## 2023-04-28 LAB — CBC WITH DIFFERENTIAL/PLATELET
Abs Immature Granulocytes: 0.02 10*3/uL (ref 0.00–0.07)
Basophils Absolute: 0 10*3/uL (ref 0.0–0.1)
Basophils Relative: 1 %
Eosinophils Absolute: 0.2 10*3/uL (ref 0.0–0.5)
Eosinophils Relative: 3 %
HCT: 31 % — ABNORMAL LOW (ref 39.0–52.0)
Hemoglobin: 10.3 g/dL — ABNORMAL LOW (ref 13.0–17.0)
Immature Granulocytes: 0 %
Lymphocytes Relative: 19 %
Lymphs Abs: 1 10*3/uL (ref 0.7–4.0)
MCH: 29.9 pg (ref 26.0–34.0)
MCHC: 33.2 g/dL (ref 30.0–36.0)
MCV: 89.9 fL (ref 80.0–100.0)
Monocytes Absolute: 0.4 10*3/uL (ref 0.1–1.0)
Monocytes Relative: 7 %
Neutro Abs: 3.5 10*3/uL (ref 1.7–7.7)
Neutrophils Relative %: 70 %
Platelets: 207 10*3/uL (ref 150–400)
RBC: 3.45 MIL/uL — ABNORMAL LOW (ref 4.22–5.81)
RDW: 13.4 % (ref 11.5–15.5)
WBC: 5 10*3/uL (ref 4.0–10.5)
nRBC: 0 % (ref 0.0–0.2)

## 2023-04-28 LAB — BRAIN NATRIURETIC PEPTIDE: B Natriuretic Peptide: 707.7 pg/mL — ABNORMAL HIGH (ref 0.0–100.0)

## 2023-04-28 MED ORDER — SEVELAMER CARBONATE 800 MG PO TABS
800.0000 mg | ORAL_TABLET | Freq: Three times a day (TID) | ORAL | Status: DC
  Administered 2023-04-29 – 2023-04-30 (×5): 800 mg via ORAL
  Filled 2023-04-28 (×5): qty 1

## 2023-04-28 MED ORDER — ACETAMINOPHEN 325 MG PO TABS
650.0000 mg | ORAL_TABLET | Freq: Four times a day (QID) | ORAL | Status: DC | PRN
  Administered 2023-04-30: 650 mg via ORAL

## 2023-04-28 MED ORDER — MORPHINE SULFATE (PF) 2 MG/ML IV SOLN
2.0000 mg | Freq: Four times a day (QID) | INTRAVENOUS | Status: AC | PRN
  Administered 2023-04-29 (×2): 2 mg via INTRAVENOUS
  Filled 2023-04-28 (×2): qty 1

## 2023-04-28 MED ORDER — CHLORHEXIDINE GLUCONATE CLOTH 2 % EX PADS
6.0000 | MEDICATED_PAD | Freq: Every day | CUTANEOUS | Status: DC
  Administered 2023-04-29 – 2023-05-01 (×3): 6 via TOPICAL
  Filled 2023-04-28: qty 6

## 2023-04-28 MED ORDER — ASPIRIN 81 MG PO TBEC
81.0000 mg | DELAYED_RELEASE_TABLET | Freq: Every day | ORAL | Status: DC
  Administered 2023-04-28 – 2023-04-30 (×3): 81 mg via ORAL
  Filled 2023-04-28 (×3): qty 1

## 2023-04-28 MED ORDER — HYDRALAZINE HCL 20 MG/ML IJ SOLN
10.0000 mg | Freq: Four times a day (QID) | INTRAMUSCULAR | Status: DC | PRN
  Administered 2023-04-29: 10 mg via INTRAVENOUS
  Filled 2023-04-28: qty 1

## 2023-04-28 MED ORDER — SODIUM CHLORIDE 0.9% FLUSH
3.0000 mL | Freq: Two times a day (BID) | INTRAVENOUS | Status: DC
  Administered 2023-04-28 – 2023-04-30 (×5): 3 mL via INTRAVENOUS

## 2023-04-28 MED ORDER — HYDRALAZINE HCL 50 MG PO TABS
50.0000 mg | ORAL_TABLET | Freq: Three times a day (TID) | ORAL | Status: DC
  Administered 2023-04-28 – 2023-04-30 (×7): 50 mg via ORAL
  Filled 2023-04-28 (×8): qty 1

## 2023-04-28 MED ORDER — ACETAMINOPHEN 650 MG RE SUPP: 650.0000 mg | Freq: Four times a day (QID) | RECTAL | Status: DC | PRN

## 2023-04-28 MED ORDER — ROSUVASTATIN CALCIUM 10 MG PO TABS
5.0000 mg | ORAL_TABLET | Freq: Every day | ORAL | Status: DC
  Administered 2023-04-29 – 2023-04-30 (×2): 5 mg via ORAL
  Filled 2023-04-28 (×2): qty 1

## 2023-04-28 MED ORDER — AMLODIPINE BESYLATE 10 MG PO TABS
10.0000 mg | ORAL_TABLET | Freq: Every day | ORAL | Status: DC
  Administered 2023-04-28 – 2023-04-30 (×3): 10 mg via ORAL
  Filled 2023-04-28: qty 2
  Filled 2023-04-28 (×2): qty 1

## 2023-04-28 MED ORDER — LEVOTHYROXINE SODIUM 50 MCG PO TABS
175.0000 ug | ORAL_TABLET | Freq: Every day | ORAL | Status: DC
  Administered 2023-04-29 – 2023-05-01 (×3): 175 ug via ORAL
  Filled 2023-04-28 (×3): qty 1

## 2023-04-28 MED ORDER — HEPARIN SODIUM (PORCINE) 5000 UNIT/ML IJ SOLN
5000.0000 [IU] | Freq: Three times a day (TID) | INTRAMUSCULAR | Status: DC
  Administered 2023-04-28 – 2023-05-01 (×8): 5000 [IU] via SUBCUTANEOUS
  Filled 2023-04-28 (×8): qty 1

## 2023-04-28 MED ORDER — PREGABALIN 50 MG PO CAPS
100.0000 mg | ORAL_CAPSULE | ORAL | Status: AC
  Administered 2023-04-28: 100 mg via ORAL
  Filled 2023-04-28: qty 2

## 2023-04-28 MED ORDER — SODIUM BICARBONATE 650 MG PO TABS
1300.0000 mg | ORAL_TABLET | Freq: Once | ORAL | Status: AC
  Administered 2023-04-28: 1300 mg via ORAL
  Filled 2023-04-28: qty 2

## 2023-04-28 NOTE — H&P (Incomplete)
History and Physical    Patient: Gregory Crane ZOX:[id number] DOB: [birth year] DOA: 04/28/2023 DOS: the patient was seen and examined on 04/29/2023 PCP: Dale [place], MD  Patient coming from: Home   Chief Complaint:  Chief Complaint  Patient presents with   fluid retention    HPI: Gregory Crane is a 52 y.o. male with medical history significant for ESRD and with h/o HD, and off HD for past 3 months , PMH  DM, HTN, Anemia, Bells palsy, Hypothyroidism, Kidney stones, stroke and pseudotumour cerebri coming with edema all over his legs and body for past 3 days, it has been progressive and he has weeping of his legs. He stopped HD and was doing well for 3 months up until 3 days ago. No Fever/ chills rash N/V/D chest pain but did report SOB/ Orthopnea . Per report family and him made this decision about stopping HD.  Patient still avoids urine. On initial presentation.  Alert awake oriented hypertensive.  O2 sats of 99% on room air. Patient is in no distress. Metabolic panel showed bicarb of 15 creatinine of 9.79 normal potassium BUN of 70 calcium of 7.0 LFTs within normal limits. BNP 707.7. CBC shows normal white count of 5 hemoglobin of 10.3 and platelet count of 207. Requested CT of the abdomen and pelvis noncontrast today showing: No acute findings in the abdomen or pelvis.   Previously seen left hydronephrosis has resolved. Extensive nonspecific perinephric stranding is stable.   Trace bilateral pleural effusions.  Left base atelectasis.   Anasarca throughout the abdominal wall. Last echocardiogram was in October 2023.  Showing: 1. Left ventricular ejection fraction, by estimation, is 60 to 65%. The  left ventricle has normal function. The left ventricle has no regional  wall motion abnormalities. Left ventricular diastolic parameters were  normal.   2. Right ventricular systolic function is normal. The right ventricular  size is normal.   3. The mitral valve is normal in  structure. No evidence of mitral valve  regurgitation.   4. The aortic valve is tricuspid. Aortic valve regurgitation is not  visualized.   5. Aortic dilatation noted. There is mild dilatation of the aortic root  and of the ascending aorta, measuring 40 mm.   6. The inferior vena cava is normal in size with greater than 50%  respiratory variability, suggesting right atrial pressure of 3 mmHg.  In the emergency room case was discussed with nephrology with recommendations for hemodialysis tomorrow morning and starting bicarb therapy.   Review of Systems: Review of Systems  Respiratory:  Positive for shortness of breath.   Cardiovascular:  Positive for leg swelling.  All other systems reviewed and are negative.   Past Medical History:  Diagnosis Date   Allergy    Anemia    Bell's palsy    Diabetes mellitus without complication (HCC)    diet controlled   Hypertension    Hypothyroidism    Kidney stones    Pseudotumor cerebri    Stroke [facility])    Past Surgical History:  Procedure Laterality Date   COLONOSCOPY WITH PROPOFOL N/A 03/30/2020   Procedure: COLONOSCOPY WITH PROPOFOL;  Surgeon: Regis Bill, MD;  Location: [facility] ENDOSCOPY;  Service: Endoscopy;  Laterality: N/A;   LOOP RECORDER INSERTION N/A 01/27/2018   Procedure: LOOP RECORDER INSERTION;  Surgeon: Duke Salvia, MD;  Location: [facility] INVASIVE CV LAB;  Service: Cardiovascular;  Laterality: N/A;   LUMBAR PUNCTURE     as child   NO PAST SURGERIES  TEE WITHOUT CARDIOVERSION N/A 01/07/2018   Procedure: TRANSESOPHAGEAL ECHOCARDIOGRAM (TEE);  Surgeon: Antonieta Iba, MD;  Location: [facility] ORS;  Service: Cardiovascular;  Laterality: N/A;   Social History:   reports that he has never smoked. He has never used smokeless tobacco. He reports that he does not drink alcohol and does not use drugs.  No Known Allergies  Family History  Problem Relation Age of Onset   Breast cancer Mother    Diabetes Father    Diabetes  Sister    Arthritis Maternal Grandmother    Diabetes Maternal Grandmother     Prior to Admission medications   Medication Sig Start Date End Date Taking? Authorizing Provider  Accu-Chek FastClix Lancets MISC USE TO CHECK BLOOD SUGAR THREE TIMES DAILY AS DIRECTED 05/31/20  Yes Glori Luis, MD  amLODipine (NORVASC) 10 MG tablet TAKE 1 TABLET(10 MG) BY MOUTH DAILY 10/25/22  Yes Dale Exeland, MD  aspirin (ASPIRIN LOW DOSE) 81 MG EC tablet TAKE 1 TABLET(81 MG) BY MOUTH DAILY 01/23/21  Yes Dale [name], MD  glucose blood (FREESTYLE LITE) test strip Check blood sugars twice a day (Dx. 250.02) 04/18/17  Yes Dale [place]rick, MD  hydrALAZINE (APRESOLINE) 50 MG tablet TAKE 1 TABLET(50 MG) BY MOUTH THREE TIMES DAILY 02/17/23  Yes Dale Tyronza, MD  insulin lispro (HUMALOG) 100 UNIT/ML injection Inject 4-12 units three times daily before meals per sliding scale: If premeal sugar is <130, inject 4 units; if 131-180, inject 6 units; if 181-240, inject 8 units; if 241-300, inject 10 units, if  >300, inject 12 units (max daily dose 36 units) 02/05/23  Yes Dale [place], MD  Insulin Syringe-Needle U-100 (INSULIN SYRINGE .5CC/30GX5/16") 30G X 5/16" 0.5 ML MISC USE AS DIRECTED WITH INSULIN 02/05/23  Yes Dale [place], MD  levothyroxine (SYNTHROID) 175 MCG tablet TAKE 1 TABLET (175 MCG) BY MOUTH ONCE DAILY BEFORE BREAKFAST. 06/26/21  Yes Dale [place], MD  pregabalin (LYRICA) 100 MG capsule TAKE 1 CAPSULE(100 MG) BY MOUTH THREE TIMES DAILY Patient taking differently: Take 100 mg by mouth daily. 09/12/22  Yes Dale [place], MD  insulin glargine-yfgn (SEMGLEE, YFGN,) 100 UNIT/ML injection INJECT 10- 15 UNITS INTO THE SKIN DAILY 01/10/23   Dale [place], MD  RENVELA 800 MG tablet Take 800 mg by mouth 3 (three) times daily. 04/23/22   [provider]  rosuvastatin (CRESTOR) 5 MG tablet Take 5 mg by mouth daily.    [provider]     Vitals:   04/28/23 2219 04/29/23 0045 04/29/23 0117  04/29/23 0144  BP: (!) 194/98 (!) 193/94  (!) 197/85  Pulse: 80 91    Resp: 20 20    Temp: 97.7 F (36.5 C) 97.7 F (36.5 C)    TempSrc: Oral Oral    SpO2: 100% 91%    Weight:   132.3 kg   Height:       Physical Exam Vitals and nursing note reviewed.  Constitutional:      General: He is not in acute distress.    Appearance: He is obese.  HENT:     Head: Normocephalic and atraumatic.     Right Ear: Hearing normal.     Left Ear: Hearing normal.     Nose: Nose normal. No nasal deformity.     Mouth/Throat:     Lips: Pink.     Tongue: No lesions.     Pharynx: Oropharynx is clear.  Eyes:     General: Lids are normal.     Extraocular Movements:  Extraocular movements intact.  Cardiovascular:     Rate and Rhythm: Normal rate and regular rhythm.     Heart sounds: Murmur heard.  Pulmonary:     Effort: Pulmonary effort is normal.     Breath sounds: Normal breath sounds.  Abdominal:     General: Bowel sounds are normal. There is no distension.     Palpations: Abdomen is soft. There is no mass.     Tenderness: There is no abdominal tenderness.  Musculoskeletal:        General: Swelling present.     Right lower leg: Edema present.     Left lower leg: Edema present.  Skin:    General: Skin is warm.  Neurological:     General: No focal deficit present.     Mental Status: He is alert and oriented to person, place, and time.     Cranial Nerves: Cranial nerves 2-12 are intact.  Psychiatric:        Attention and Perception: Attention normal.        Mood and Affect: Mood normal.        Speech: Speech normal.        Behavior: Behavior normal. Behavior is cooperative.     Labs on Admission: I have personally reviewed following labs and imaging studies  CBC: Recent Labs  Lab 04/28/23 1749  WBC 5.0  NEUTROABS 3.5  HGB 10.3*  HCT 31.0*  MCV 89.9  PLT 207   Basic Metabolic Panel: Recent Labs  Lab 04/28/23 1749  NA 140  K 3.8  CL 113*  CO2 15*  GLUCOSE 188*  BUN 70*   CREATININE 9.79*  CALCIUM 7.0*   GFR: Estimated Creatinine Clearance: 12.4 mL/min (A) (by C-G formula based on SCr of 9.79 mg/dL (H)). Liver Function Tests: Recent Labs  Lab 04/28/23 1749  AST 13*  ALT 10  ALKPHOS 77  BILITOT 0.5  PROT 6.5  ALBUMIN 2.6*   No results for input(s): "LIPASE", "AMYLASE" in the last 168 hours. No results for input(s): "AMMONIA" in the last 168 hours. Coagulation Profile: No results for input(s): "INR", "PROTIME" in the last 168 hours. Cardiac Enzymes: No results for input(s): "CKTOTAL", "CKMB", "CKMBINDEX", "TROPONINI" in the last 168 hours. BNP (last 3 results) No results for input(s): "PROBNP" in the last 8760 hours. HbA1C: No results for input(s): "HGBA1C" in the last 72 hours. CBG: No results for input(s): "GLUCAP" in the last 168 hours. Lipid Profile: No results for input(s): "CHOL", "HDL", "LDLCALC", "TRIG", "CHOLHDL", "LDLDIRECT" in the last 72 hours. Thyroid Function Tests: No results for input(s): "TSH", "T4TOTAL", "FREET4", "T3FREE", "THYROIDAB" in the last 72 hours. Anemia Panel: No results for input(s): "VITAMINB12", "FOLATE", "FERRITIN", "TIBC", "IRON", "RETICCTPCT" in the last 72 hours. Urinalysis    Component Value Date/Time   COLORURINE YELLOW (A) 08/22/2022 0042   APPEARANCEUR Clear 10/30/2022 1446   LABSPEC 1.012 08/22/2022 0042   LABSPEC 1.033 01/27/2014 0247   PHURINE 5.0 08/22/2022 0042   GLUCOSEU Trace (A) 10/30/2022 1446   GLUCOSEU 100 (A) 05/08/2022 1101   HGBUR SMALL (A) 08/22/2022 0042   BILIRUBINUR Negative 10/30/2022 1446   BILIRUBINUR Negative 01/27/2014 0247   KETONESUR NEGATIVE 08/22/2022 0042   PROTEINUR 3+ (A) 10/30/2022 1446   PROTEINUR >=300 (A) 08/22/2022 0042   UROBILINOGEN 0.2 05/08/2022 1101   NITRITE Negative 10/30/2022 1446   NITRITE NEGATIVE 08/22/2022 0042   LEUKOCYTESUR Negative 10/30/2022 1446   LEUKOCYTESUR NEGATIVE 08/22/2022 0042   LEUKOCYTESUR Negative 01/27/2014 0247   Unresulted  Labs (From admission, onward)     Start     Ordered   04/29/23 0500  Comprehensive metabolic panel  Tomorrow morning,   R        04/28/23 2118   04/29/23 0500  CBC  Tomorrow morning,   R        04/28/23 2118   04/29/23 0154  Hemoglobin A1c  Add-on,   AD        04/29/23 0153   04/28/23 1947  Hepatitis B surface antigen  (New Admission Hemo Labs (Hepatitis B))  Once,   URGENT        04/28/23 1948   04/28/23 1947  Hepatitis B surface antibody,quantitative  (New Admission Hemo Labs (Hepatitis B))  Once,   URGENT        04/28/23 1948           Medications  Chlorhexidine Gluconate Cloth 2 % PADS 6 each (has no administration in time range)  aspirin EC tablet 81 mg (81 mg Oral Given 04/28/23 2206)  hydrALAZINE (APRESOLINE) tablet 50 mg (50 mg Oral Given 04/28/23 2206)  levothyroxine (SYNTHROID) tablet 175 mcg (has no administration in time range)  sevelamer carbonate (RENVELA) tablet 800 mg (has no administration in time range)  rosuvastatin (CRESTOR) tablet 5 mg (has no administration in time range)  amLODipine (NORVASC) tablet 10 mg (10 mg Oral Given 04/28/23 2206)  hydrALAZINE (APRESOLINE) injection 10 mg (10 mg Intravenous Given 04/29/23 0048)  heparin injection 5,000 Units (5,000 Units Subcutaneous Given 04/28/23 2205)  sodium chloride flush (NS) 0.9 % injection 3 mL (3 mLs Intravenous Given 04/28/23 2154)  acetaminophen (TYLENOL) tablet 650 mg (has no administration in time range)    Or  acetaminophen (TYLENOL) suppository 650 mg (has no administration in time range)  morphine (PF) 2 MG/ML injection 2 mg (has no administration in time range)  sodium bicarbonate tablet 1,300 mg (1,300 mg Oral Given 04/28/23 1955)  pregabalin (LYRICA) capsule 100 mg (100 mg Oral Given 04/28/23 2028)   Radiological Exams on Admission: CT ABDOMEN PELVIS WO CONTRAST  Result Date: 04/28/2023 CLINICAL DATA:  Abdominal pain, acute, nonlocalized EXAM: CT ABDOMEN AND PELVIS WITHOUT CONTRAST TECHNIQUE: Multidetector CT  imaging of the abdomen and pelvis was performed following the standard protocol without IV contrast. RADIATION DOSE REDUCTION: This exam was performed according to the departmental dose-optimization program which includes automated exposure control, adjustment of the mA and/or kV according to patient size and/or use of iterative reconstruction technique. COMPARISON:  None Available. FINDINGS: Lower chest: Trace bilateral pleural effusions. Left base atelectasis. Hepatobiliary: No focal liver abnormality is seen. Status post cholecystectomy. No biliary dilatation. Pancreas: No focal abnormality or ductal dilatation. Spleen: No focal abnormality.  Normal size. Adrenals/Urinary Tract: Adrenal glands normal. Bilateral perinephric stranding is stable since prior study. No stones or hydronephrosis. No renal mass. Urinary bladder unremarkable. Stomach/Bowel: Stomach, large and small bowel grossly unremarkable. Appendix normal. Vascular/Lymphatic: No evidence of aneurysm or adenopathy. Scattered aortic atherosclerosis. Reproductive: No visible focal abnormality. Other: No free fluid or free air. Diffuse edema throughout the abdominal wall compatible with anasarca. Musculoskeletal: No acute bony abnormality. IMPRESSION: No acute findings in the abdomen or pelvis. Previously seen left hydronephrosis has resolved. Extensive nonspecific perinephric stranding is stable. Trace bilateral pleural effusions.  Left base atelectasis. Anasarca throughout the abdominal wall. Electronically Signed   By: Charlett Nose M.D.   On: 04/28/2023 21:19   DG Chest 2 View  Result Date: 04/28/2023 CLINICAL DATA:  Shortness of breath EXAM:  CHEST - 2 VIEW COMPARISON:  04/03/2020 FINDINGS: Heart and mediastinal contours within normal limits. No confluent airspace opacities or effusions. No acute bony abnormality. IMPRESSION: No active cardiopulmonary disease. Electronically Signed   By: Charlett Nose M.D.   On: 04/28/2023 19:21     Data  Reviewed: Relevant notes from primary care and specialist visits, past discharge summaries as available in EHR, including Care Everywhere. Prior diagnostic testing as pertinent to current admission diagnoses Updated medications and problem lists for reconciliation ED course, including vitals, labs, imaging, treatment and response to treatment Triage notes, nursing and pharmacy notes and ED provider's notes Notable results as noted in HPI  Assessment and Plan: * Swelling of both lower extremities Pt presenting with volume overload 2/2 ESRD and ECHO few months ago showed EF of 65%. Pt needs HD for volume overload and management suspect he may need it scheduled from now on. No abx or rash or any fever or any complaints.  We will admit to telemetry unit and monitor.   ESRD on hemodialysis [facility]) Nephrology Consulted- Dr. Thedore Mins Continue amlodipine /hydralazine/ levothyroxine / asa 81 mg / Crestor  and Renvela.   Essential hypertension Continue amlodipine/ hydralazine.   History of CVA (cerebrovascular accident) Continue asa 81 mg daily and statin.   Hypothyroidism Continue levothyroxine .   Type 2 diabetes mellitus, with long-term current use of insulin (HCC) Glycemic protocol.  Consistent carb and renal diet.   Anemia Type/ screen. Follow cbc.    DVT prophylaxis:  Heparin   Consults:  Nephrology   Advance Care Planning:    Code Status: Full Code   Family Communication:  None   Disposition Plan:  Home   Severity of Illness: The appropriate patient status for this patient is OBSERVATION. Observation status is judged to be reasonable and necessary in order to provide the required intensity of service to ensure the patient's safety. The patient's presenting symptoms, physical exam findings, and initial radiographic and laboratory data in the context of their medical condition is felt to place them at decreased risk for further clinical deterioration. Furthermore, it is  anticipated that the patient will be medically stable for discharge from the hospital within 2 midnights of admission.   Author: Gertha Calkin, MD 04/29/2023 2:07 AM  For on call review [email or web address].

## 2023-04-28 NOTE — Assessment & Plan Note (Signed)
Pt presenting with volume overload 2/2 ESRD and ECHO few months ago showed EF of 65%. Pt needs HD for volume overload and management suspect he may need it scheduled from now on. No abx or rash or any fever or any complaints.  We will admit to telemetry unit and monitor.

## 2023-04-28 NOTE — Assessment & Plan Note (Addendum)
Nephrology Consulted- Dr. Thedore Mins Continue amlodipine /hydralazine/ levothyroxine / asa 81 mg / Crestor  and Renvela.

## 2023-04-28 NOTE — Assessment & Plan Note (Signed)
Continue asa 81 mg daily and statin.

## 2023-04-28 NOTE — Assessment & Plan Note (Signed)
Glycemic protocol.  Consistent carb and renal diet.

## 2023-04-28 NOTE — Assessment & Plan Note (Signed)
Type/ screen. Follow cbc.

## 2023-04-28 NOTE — Assessment & Plan Note (Signed)
Continue levothyroxine 

## 2023-04-28 NOTE — ED Triage Notes (Signed)
Pt reports that he is swelling all over for a few days. Hx of dialysis, last tx 3 months ago. Left leg began weeping today. Pt took lasix yesterday and today. Endorses trouble breathing with exertion or laying flat.

## 2023-04-28 NOTE — Assessment & Plan Note (Signed)
Continue amlodipine/ hydralazine.

## 2023-04-28 NOTE — ED Provider Notes (Signed)
[facility] Provider Note    Event Date/Time   First MD Initiated Contact with Patient 04/28/23 1834     (approximate)   History   Chief Complaint fluid retention   HPI  Gregory Crane is a 52 y.o. male with past medical history of hypertension, hyperlipidemia, stroke, and ESRD who presents to the ED complaining of fluid retention.  Patient reports that he was on dialysis up until 3 months ago, when he states that "my family and I decided I no longer needed it."  He states that he was doing well up until 3 days ago, urinating frequently with no complaints.  He states that his urine output dropped 3 days ago and he has noticed increasing swelling in his legs as well as his abdomen.  He reports feeling short of breath when laying flat but denies any difficulty breathing sitting upright.  He has not had any fevers or cough and denies any chest pain.     Physical Exam   Triage Vital Signs: ED Triage Vitals  Encounter Vitals Group     BP 04/28/23 1742 (!) 213/94     Systolic BP Percentile --      Diastolic BP Percentile --      Pulse Rate 04/28/23 1742 87     Resp 04/28/23 1742 20     Temp 04/28/23 1742 98.4 F (36.9 C)     Temp Source 04/28/23 1742 Oral     SpO2 04/28/23 1742 99 %     Weight --      Height 04/28/23 1741 6' (1.829 m)     Head Circumference --      Peak Flow --      Pain Score 04/28/23 1741 0     Pain Loc --      Pain Education --      Exclude from Growth Chart --     Most recent vital signs: Vitals:   04/28/23 1742 04/28/23 1900  BP: (!) 213/94 (!) 189/85  Pulse: 87 84  Resp: 20 19  Temp: 98.4 F (36.9 C)   SpO2: 99% 96%    Constitutional: Alert and oriented. Eyes: Conjunctivae are normal. Head: Atraumatic. Nose: No congestion/rhinnorhea. Mouth/Throat: Mucous membranes are moist.  Cardiovascular: Normal rate, regular rhythm. Grossly normal heart sounds.  2+ radial and DP pulses bilaterally.  Left upper extremity AV  fistula with palpable thrill. Respiratory: Normal respiratory effort.  No retractions. Lungs CTAB. Gastrointestinal: Soft and nontender. No distention.  Pitting edema to abdominal wall. Musculoskeletal: No lower extremity tenderness, 2+ pitting edema to mid thighs bilaterally. Neurologic:  Normal speech and language. No gross focal neurologic deficits are appreciated.    ED Results / Procedures / Treatments   Labs (all labs ordered are listed, but only abnormal results are displayed) Labs Reviewed  CBC WITH DIFFERENTIAL/PLATELET - Abnormal; Notable for the following components:      Result Value   RBC 3.45 (*)    Hemoglobin 10.3 (*)    HCT 31.0 (*)    All other components within normal limits  BASIC METABOLIC PANEL - Abnormal; Notable for the following components:   Chloride 113 (*)    CO2 15 (*)    Glucose, Bld 188 (*)    BUN 70 (*)    Creatinine, Ser 9.79 (*)    Calcium 7.0 (*)    GFR, Estimated 6 (*)    All other components within normal limits  BRAIN NATRIURETIC PEPTIDE - Abnormal; Notable  for the following components:   B Natriuretic Peptide 707.7 (*)    All other components within normal limits  HEPATITIS B SURFACE ANTIGEN  HEPATITIS B SURFACE ANTIBODY, QUANTITATIVE     EKG  ED ECG REPORT I, Chesley Noon, the attending physician, personally viewed and interpreted this ECG.   Date: 04/28/2023  EKG Time: 17:46  Rate: 85  Rhythm: normal sinus rhythm  Axis: Normal  Intervals:none  ST&T Change: None  RADIOLOGY Chest x-ray reviewed and interpreted by me with no infiltrate, edema, or effusion.  PROCEDURES:  Critical Care performed: No  Procedures   MEDICATIONS ORDERED IN ED: Medications  sodium bicarbonate tablet 1,300 mg (has no administration in time range)  Chlorhexidine Gluconate Cloth 2 % PADS 6 each (has no administration in time range)     IMPRESSION / MDM / ASSESSMENT AND PLAN / ED COURSE  I reviewed the triage vital signs and the nursing  notes.                              52 y.o. male with past medical history of hypertension, hyperlipidemia, diabetes, stroke, and ESRD who presents to the ED complaining of increasing swelling in his legs with difficulty breathing when lying flat over the past 3 days.  Patient's presentation is most consistent with acute presentation with potential threat to life or bodily function.  Differential diagnosis includes, but is not limited to, hypervolemia, CHF, pleural effusion, pneumonia, hyperkalemia, ESRD, noncompliance with dialysis.  Patient nontoxic-appearing and in no acute distress, vital signs are unremarkable.  He has significant edema to his lower extremities as well as his abdominal wall, but he is not in any respiratory distress and is maintaining oxygen saturations on room air.  Chest x-ray is unremarkable, labs show acidosis but no hyperkalemia, also showed known ESRD.  No significant anemia or leukocytosis noted, BNP elevated consistent with fluid overload.  Case discussed with Dr. Thedore Mins of nephrology, who recommends admission to the hospital so that dialysis may be restarted.  He also recommends giving a dose of 1300 mg oral sodium bicarb for his acidosis.  Case discussed with hospitalist for admission.      FINAL CLINICAL IMPRESSION(S) / ED DIAGNOSES   Final diagnoses:  ESRD on hemodialysis (HCC)  Noncompliance with renal dialysis  Acidemia     Rx / DC Orders   ED Discharge Orders     None        Note:  This document was prepared using Dragon voice recognition software and may include unintentional dictation errors.   Chesley Noon, MD 04/28/23 1949

## 2023-04-28 NOTE — ED Notes (Signed)
See triage note, pt reports has had some fluid build up the past couple days with shob with exertion. Has been taking lasix the past couple days. Denies pain. NAD noted. Stretcher locked in lowest position, call bell in reach.

## 2023-04-29 DIAGNOSIS — D631 Anemia in chronic kidney disease: Secondary | ICD-10-CM | POA: Diagnosis present

## 2023-04-29 DIAGNOSIS — Z992 Dependence on renal dialysis: Secondary | ICD-10-CM | POA: Diagnosis not present

## 2023-04-29 DIAGNOSIS — Z794 Long term (current) use of insulin: Secondary | ICD-10-CM | POA: Diagnosis not present

## 2023-04-29 DIAGNOSIS — Z5329 Procedure and treatment not carried out because of patient's decision for other reasons: Secondary | ICD-10-CM | POA: Diagnosis present

## 2023-04-29 DIAGNOSIS — I160 Hypertensive urgency: Secondary | ICD-10-CM | POA: Diagnosis present

## 2023-04-29 DIAGNOSIS — Z803 Family history of malignant neoplasm of breast: Secondary | ICD-10-CM | POA: Diagnosis not present

## 2023-04-29 DIAGNOSIS — J9811 Atelectasis: Secondary | ICD-10-CM | POA: Diagnosis present

## 2023-04-29 DIAGNOSIS — E1122 Type 2 diabetes mellitus with diabetic chronic kidney disease: Secondary | ICD-10-CM | POA: Diagnosis present

## 2023-04-29 DIAGNOSIS — Z91158 Patient's noncompliance with renal dialysis for other reason: Secondary | ICD-10-CM | POA: Diagnosis not present

## 2023-04-29 DIAGNOSIS — Z79890 Hormone replacement therapy (postmenopausal): Secondary | ICD-10-CM | POA: Diagnosis not present

## 2023-04-29 DIAGNOSIS — I120 Hypertensive chronic kidney disease with stage 5 chronic kidney disease or end stage renal disease: Secondary | ICD-10-CM | POA: Diagnosis present

## 2023-04-29 DIAGNOSIS — I10 Essential (primary) hypertension: Secondary | ICD-10-CM | POA: Diagnosis not present

## 2023-04-29 DIAGNOSIS — E669 Obesity, unspecified: Secondary | ICD-10-CM | POA: Diagnosis present

## 2023-04-29 DIAGNOSIS — Z87442 Personal history of urinary calculi: Secondary | ICD-10-CM | POA: Diagnosis not present

## 2023-04-29 DIAGNOSIS — N2581 Secondary hyperparathyroidism of renal origin: Secondary | ICD-10-CM | POA: Diagnosis present

## 2023-04-29 DIAGNOSIS — E039 Hypothyroidism, unspecified: Secondary | ICD-10-CM | POA: Diagnosis present

## 2023-04-29 DIAGNOSIS — E872 Acidosis, unspecified: Secondary | ICD-10-CM | POA: Diagnosis present

## 2023-04-29 DIAGNOSIS — Z8261 Family history of arthritis: Secondary | ICD-10-CM | POA: Diagnosis not present

## 2023-04-29 DIAGNOSIS — N186 End stage renal disease: Secondary | ICD-10-CM | POA: Diagnosis present

## 2023-04-29 DIAGNOSIS — Z833 Family history of diabetes mellitus: Secondary | ICD-10-CM | POA: Diagnosis not present

## 2023-04-29 DIAGNOSIS — E8770 Fluid overload, unspecified: Secondary | ICD-10-CM | POA: Diagnosis present

## 2023-04-29 DIAGNOSIS — M7989 Other specified soft tissue disorders: Secondary | ICD-10-CM | POA: Diagnosis not present

## 2023-04-29 DIAGNOSIS — E785 Hyperlipidemia, unspecified: Secondary | ICD-10-CM | POA: Diagnosis present

## 2023-04-29 DIAGNOSIS — Z6838 Body mass index (BMI) 38.0-38.9, adult: Secondary | ICD-10-CM | POA: Diagnosis not present

## 2023-04-29 DIAGNOSIS — Z8673 Personal history of transient ischemic attack (TIA), and cerebral infarction without residual deficits: Secondary | ICD-10-CM | POA: Diagnosis not present

## 2023-04-29 LAB — BASIC METABOLIC PANEL
Anion gap: 14 (ref 5–15)
BUN: 70 mg/dL — ABNORMAL HIGH (ref 6–20)
CO2: 15 mmol/L — ABNORMAL LOW (ref 22–32)
Calcium: 7.2 mg/dL — ABNORMAL LOW (ref 8.9–10.3)
Chloride: 113 mmol/L — ABNORMAL HIGH (ref 98–111)
Creatinine, Ser: 10.01 mg/dL — ABNORMAL HIGH (ref 0.61–1.24)
GFR, Estimated: 6 mL/min — ABNORMAL LOW (ref >60)
Glucose, Bld: 131 mg/dL — ABNORMAL HIGH (ref 70–99)
Potassium: 3.5 mmol/L (ref 3.5–5.1)
Sodium: 142 mmol/L (ref 135–145)

## 2023-04-29 LAB — CBC
HCT: 30.6 % — ABNORMAL LOW (ref 39.0–52.0)
Hemoglobin: 10.3 g/dL — ABNORMAL LOW (ref 13.0–17.0)
MCH: 30.1 pg (ref 26.0–34.0)
MCHC: 33.7 g/dL (ref 30.0–36.0)
MCV: 89.5 fL (ref 80.0–100.0)
Platelets: 191 10*3/uL (ref 150–400)
RBC: 3.42 MIL/uL — ABNORMAL LOW (ref 4.22–5.81)
RDW: 13.3 % (ref 11.5–15.5)
WBC: 6.5 10*3/uL (ref 4.0–10.5)
nRBC: 0 % (ref 0.0–0.2)

## 2023-04-29 LAB — IRON AND TIBC
Iron: 55 ug/dL (ref 45–182)
Saturation Ratios: 28 % (ref 17.9–39.5)
TIBC: 200 ug/dL — ABNORMAL LOW (ref 250–450)
UIBC: 145 ug/dL

## 2023-04-29 LAB — HEMOGLOBIN A1C
Hgb A1c MFr Bld: 5.8 % — ABNORMAL HIGH (ref 4.8–5.6)
Mean Plasma Glucose: 119.76 mg/dL

## 2023-04-29 LAB — VITAMIN B12: Vitamin B-12: 91 pg/mL — ABNORMAL LOW (ref 180–914)

## 2023-04-29 LAB — GLUCOSE, CAPILLARY
Glucose-Capillary: 87 mg/dL (ref 70–99)
Glucose-Capillary: 125 mg/dL — ABNORMAL HIGH (ref 70–99)
Glucose-Capillary: 114 mg/dL — ABNORMAL HIGH (ref 70–99)

## 2023-04-29 LAB — FOLATE: Folate: 10.4 ng/mL (ref >5.9)

## 2023-04-29 LAB — HEPATITIS B SURFACE ANTIGEN: Hepatitis B Surface Ag: NONREACTIVE

## 2023-04-29 LAB — VITAMIN D 25 HYDROXY (VIT D DEFICIENCY, FRACTURES): Vit D, 25-Hydroxy: 18.88 ng/mL — ABNORMAL LOW (ref 30–100)

## 2023-04-29 LAB — MRSA NEXT GEN BY PCR, NASAL: MRSA by PCR Next Gen: NOT DETECTED

## 2023-04-29 MED ORDER — VITAMIN D (ERGOCALCIFEROL) 1.25 MG (50000 UNIT) PO CAPS
50000.0000 [IU] | ORAL_CAPSULE | ORAL | Status: DC
  Administered 2023-04-29: 50000 [IU] via ORAL
  Filled 2023-04-29: qty 1

## 2023-04-29 MED ORDER — ONDANSETRON HCL 4 MG/2ML IJ SOLN
4.0000 mg | Freq: Four times a day (QID) | INTRAMUSCULAR | Status: DC | PRN
  Administered 2023-04-29 – 2023-04-30 (×2): 4 mg via INTRAVENOUS
  Filled 2023-04-29: qty 2

## 2023-04-29 MED ORDER — HEPARIN SODIUM (PORCINE) 1000 UNIT/ML DIALYSIS: 20.0000 [IU]/kg | INTRAMUSCULAR | Status: DC | PRN

## 2023-04-29 MED ORDER — LABETALOL HCL 5 MG/ML IV SOLN: 10.0000 mg | INTRAVENOUS | Status: DC | PRN

## 2023-04-29 MED ORDER — CYANOCOBALAMIN 1000 MCG/ML IJ SOLN
1000.0000 ug | Freq: Every day | INTRAMUSCULAR | Status: DC
  Administered 2023-04-29 – 2023-04-30 (×2): 1000 ug via INTRAMUSCULAR
  Filled 2023-04-29 (×3): qty 1

## 2023-04-29 MED ORDER — VITAMIN B-12 1000 MCG PO TABS: 1000.0000 ug | ORAL_TABLET | Freq: Every day | ORAL | Status: DC

## 2023-04-29 MED ORDER — HEPARIN SODIUM (PORCINE) 1000 UNIT/ML DIALYSIS: 1000.0000 [IU] | INTRAMUSCULAR | Status: DC | PRN

## 2023-04-29 MED ORDER — LIDOCAINE-PRILOCAINE 2.5-2.5 % EX CREA: 1.0000 | TOPICAL_CREAM | CUTANEOUS | Status: DC | PRN

## 2023-04-29 MED ORDER — ONDANSETRON HCL 4 MG/2ML IJ SOLN
INTRAMUSCULAR | Status: AC
  Filled 2023-04-29: qty 2

## 2023-04-29 MED ORDER — PENTAFLUOROPROP-TETRAFLUOROETH EX AERO: 1.0000 | INHALATION_SPRAY | CUTANEOUS | Status: DC | PRN

## 2023-04-29 MED ORDER — NEPRO/CARBSTEADY PO LIQD: 237.0000 mL | ORAL | Status: DC | PRN

## 2023-04-29 MED ORDER — LIDOCAINE HCL (PF) 1 % IJ SOLN: 5.0000 mL | INTRAMUSCULAR | Status: DC | PRN

## 2023-04-29 MED ORDER — LABETALOL HCL 5 MG/ML IV SOLN
10.0000 mg | Freq: Once | INTRAVENOUS | Status: AC
  Administered 2023-04-29: 10 mg via INTRAVENOUS
  Filled 2023-04-29: qty 4

## 2023-04-29 MED ORDER — ALTEPLASE 2 MG IJ SOLR: 2.0000 mg | Freq: Once | INTRAMUSCULAR | Status: DC | PRN

## 2023-04-29 MED ORDER — ANTICOAGULANT SODIUM CITRATE 4% (200MG/5ML) IV SOLN: 5.0000 mL | Status: DC | PRN

## 2023-04-29 MED ORDER — PENTAFLUOROPROP-TETRAFLUOROETH EX AERO
INHALATION_SPRAY | CUTANEOUS | Status: AC
  Filled 2023-04-29: qty 30

## 2023-04-29 MED ORDER — INSULIN ASPART 100 UNIT/ML IJ SOLN
0.0000 [IU] | Freq: Three times a day (TID) | INTRAMUSCULAR | Status: DC
  Administered 2023-04-29: 2 [IU] via SUBCUTANEOUS
  Filled 2023-04-29: qty 1

## 2023-04-29 NOTE — Progress Notes (Signed)
[facility] Progress Note  Patient: Gregory Crane    EXB:[id number]  DOA: 04/28/2023     Date of Service: the patient was seen and examined on 04/29/2023  Chief Complaint  Patient presents with   fluid retention   Brief hospital course: Gregory Crane is a 52 y.o. male with medical history significant for ESRD and with h/o HD, and off HD for past 3 months , PMH  DM, HTN, Anemia, Bells palsy, Hypothyroidism, Kidney stones, stroke and pseudotumour cerebri coming with edema all over his legs and body for past 3 days, it has been progressive and he has weeping of his legs. He stopped HD and was doing well for 3 months up until 3 days ago.  Patient is still making urine.  Denies any other complaints.  As per family patient made his own decision to stop hemodialysis. ED workup: Hypertensive urgency, BP 213/94 rest within normal range.   BMP CO2 15, creatinine 9.8, BUN 70, metabolic acidosis.  Calcium 7.0, albumin 2.6, BNP 707. CT A/P: Anasarca throughout abdominal wall, no any other acute findings CXR no acute cardiopulmonary disease  Assessment and Plan:  # ESRD on HD Patient stopped hemodialysis 3 months ago Presented with anasarca Patient received hemodialysis on 9/3 Continue Ren[facility]a Nephrology consulted.   Hypertensive urgency, HLD Continue aspirin, amlodipine, hydralazine and Crestor Use IV labetalol as needed Monitor BP and titrate medications accordingly  IDDM T2, HbA1c 5.8 well-controlled. Held home regimen for now Started NovoLog sliding scale, continue diabetic diet, monitor CBG   Hypothyroid, continue Synthroid  Anemia of chronic disease and B12 deficiency Monitor H&H  Vitamin D deficiency: started vitamin D 50,000 units p.o. weekly, follow with PCP to repeat vitamin D level after 3 to 6 months.  Vitamin B12 deficiency: Started vitamin B12 1000 mcg IM injection daily during hospital stay, followed by oral supplement.  Follow-up PCP to repeat vitamin B12 level  after 3 to 6 months.  Body mass index is 38.03 kg/m.  Interventions:  Diet: Carb modified and heart healthy diet DVT Prophylaxis: Subcutaneous Heparin    Advance goals of care discussion: Full code  Family Communication: family was not present at bedside, at the time of interview.  The pt provided permission to discuss medical plan with the family. Opportunity was given to ask question and all questions were answered satisfactorily.   Disposition:  Pt is from Home, admitted with anasarca due to ESRD, missed hemodialysis for 3 months, still has volume overload, which precludes a safe discharge. Discharge to home, when stable and cleared by nephrology.  Subjective: No significant events overnight, patient was seen after hemodialysis, tolerated procedure well.  Patient was laying comfortably, denied any specific complaints, no chest pain or pressure, no shortness of breath.  Denied any anxiety or depression.  Physical Exam: General: NAD, lying comfortably Appear in no distress, affect appropriate Eyes: PERRLA ENT: Oral Mucosa Clear, moist  Neck: no JVD,  Cardiovascular: S1 and S2 Present, no Murmur,  Respiratory: good respiratory effort, Bilateral Air entry equal and Decreased, no Crackles, no wheezes Abdomen: Bowel Sound present, Soft and no tenderness,  Skin: no rashes Extremities: 4+ edema extending to abdominal wall, no calf tenderness Neurologic: without any new focal findings Gait not checked due to patient safety concerns  Vitals:   04/29/23 1130 04/29/23 1148 04/29/23 1215 04/29/23 1237  BP: (!) 158/81 (!) 183/84  (!) 161/74  Pulse: 75 83  76  Resp: 18 (!) 21  18  Temp:  97.8 F (  36.6 C)  97.6 F (36.4 C)  TempSrc:  Oral  Oral  SpO2: 98% 99%  98%  Weight:   127.2 kg   Height:        Intake/Output Summary (Last 24 hours) at 04/29/2023 1255 Last data filed at 04/29/2023 1148 Gross per 24 hour  Intake 270 ml  Output 2700 ml  Net -2430 ml   Filed Weights    04/29/23 0117 04/29/23 0931 04/29/23 1215  Weight: 132.3 kg 129.4 kg 127.2 kg    Data Reviewed: I have personally reviewed and interpreted daily labs, tele strips, imagings as discussed above. I reviewed all nursing notes, pharmacy notes, vitals, pertinent old records I have discussed plan of care as described above with RN and patient/family.  CBC: Recent Labs  Lab 04/28/23 1749 04/29/23 0243  WBC 5.0 6.5  NEUTROABS 3.5  --   HGB 10.3* 10.3*  HCT 31.0* 30.6*  MCV 89.9 89.5  PLT 207 191   Basic Metabolic Panel: Recent Labs  Lab 04/28/23 1749 04/29/23 0243  NA 140 142  K 3.8 3.5  CL 113* 113*  CO2 15* 15*  GLUCOSE 188* 131*  BUN 70* 70*  CREATININE 9.79* 10.01*  CALCIUM 7.0* 7.2*    Studies: CT ABDOMEN PELVIS WO CONTRAST  Result Date: 04/28/2023 CLINICAL DATA:  Abdominal pain, acute, nonlocalized EXAM: CT ABDOMEN AND PELVIS WITHOUT CONTRAST TECHNIQUE: Multidetector CT imaging of the abdomen and pelvis was performed following the standard protocol without IV contrast. RADIATION DOSE REDUCTION: This exam was performed according to the departmental dose-optimization program which includes automated exposure control, adjustment of the mA and/or kV according to patient size and/or use of iterative reconstruction technique. COMPARISON:  None Available. FINDINGS: Lower chest: Trace bilateral pleural effusions. Left base atelectasis. Hepatobiliary: No focal liver abnormality is seen. Status post cholecystectomy. No biliary dilatation. Pancreas: No focal abnormality or ductal dilatation. Spleen: No focal abnormality.  Normal size. Adrenals/Urinary Tract: Adrenal glands normal. Bilateral perinephric stranding is stable since prior study. No stones or hydronephrosis. No renal mass. Urinary bladder unremarkable. Stomach/Bowel: Stomach, large and small bowel grossly unremarkable. Appendix normal. Vascular/Lymphatic: No evidence of aneurysm or adenopathy. Scattered aortic atherosclerosis.  Reproductive: No visible focal abnormality. Other: No free fluid or free air. Diffuse edema throughout the abdominal wall compatible with anasarca. Musculoskeletal: No acute bony abnormality. IMPRESSION: No acute findings in the abdomen or pelvis. Previously seen left hydronephrosis has resolved. Extensive nonspecific perinephric stranding is stable. Trace bilateral pleural effusions.  Left base atelectasis. Anasarca throughout the abdominal wall. Electronically Signed   By: Charlett Nose M.D.   On: 04/28/2023 21:19   DG Chest 2 View  Result Date: 04/28/2023 CLINICAL DATA:  Shortness of breath EXAM: CHEST - 2 VIEW COMPARISON:  04/03/2020 FINDINGS: Heart and mediastinal contours within normal limits. No confluent airspace opacities or effusions. No acute bony abnormality. IMPRESSION: No active cardiopulmonary disease. Electronically Signed   By: Charlett Nose M.D.   On: 04/28/2023 19:21    Scheduled Meds:  amLODipine  10 mg Oral Daily   aspirin EC  81 mg Oral Daily   Chlorhexidine Gluconate Cloth  6 each Topical Q0600   heparin  5,000 Units Subcutaneous Q8H   hydrALAZINE  50 mg Oral Q8H   levothyroxine  175 mcg Oral Q0600   rosuvastatin  5 mg Oral Daily   sevelamer carbonate  800 mg Oral TID WC   sodium chloride flush  3 mL Intravenous Q12H   Continuous Infusions: PRN Meds: acetaminophen **OR**  acetaminophen, labetalol, morphine injection  Time spent: 55 minutes  Author: Gillis Santa. MD [facility] 04/29/2023 12:55 PM  To reach On-call, see care teams to locate the attending and reach out to them via [email or web address]. If 7PM-7AM, please contact night-coverage If you still have difficulty reaching the attending provider, please page the [facility] (Director on Call) for [facility] on amion for assistance.

## 2023-04-29 NOTE — Plan of Care (Signed)

## 2023-04-29 NOTE — Progress Notes (Signed)
[facility]  ROUNDING NOTE   Subjective:   Gregory Crane is a 52 y.o. with past medical conditions including diabetes, hypertension, CVA, and end stage renal disease. Patient presents to the emergency department with increased lower extremity edema. He has been admitted for Acidemia [E87.20] Noncompliance with renal dialysis [Z91.158] ESRD on hemodialysis (HCC) [N18.6, Z99.2] Edema due to hypervolemia [E87.70] Volume overload [E87.70]  Patient is known to our practice from a previous emergency department visit. He states he voluntarily stopped dialysis about 3 months ago. He states he was feeling well until 3 days ago. He began having fluid in his lower extremities. He states he had been urinating regularly, maintaining a low sodium diet, and denies NSAID use.   Labs on ED arrival significant for creatinine 9.79 with GFR 6, BNP 707. Chest xray negative and CT abd/pelvis shows anasarca at abdominal wall.   We have been consulted to manage dialysis needs.   Objective:  Vital signs in last 24 hours:  Temp:  [97.5 F (36.4 C)-98.4 F (36.9 C)] 97.5 F (36.4 C) (09/03 1532) Pulse Rate:  [74-110] 87 (09/03 1532) Resp:  [14-22] 17 (09/03 1532) BP: (141-213)/(70-98) 165/72 (09/03 1532) SpO2:  [91 %-100 %] 97 % (09/03 1532) Weight:  [127.2 kg-132.3 kg] 127.2 kg (09/03 1215)  Weight change:  Filed Weights   04/29/23 0117 04/29/23 0931 04/29/23 1215  Weight: 132.3 kg 129.4 kg 127.2 kg    Intake/Output: I/O last 3 completed shifts: In: 150 [P.O.:150] Out: 500 [Urine:500]   Intake/Output this shift:  Total I/O In: 120 [P.O.:120] Out: 2200 [Urine:200; Other:2000]  Physical Exam: General: NAD  Head: Normocephalic, atraumatic. Moist oral mucosal membranes  Eyes: Anicteric  Lungs:  Clear to auscultation, normal effort  Heart: Regular rate and rhythm  Abdomen:  Soft, nontender, edema noted  Extremities:  3+ peripheral edema.  Neurologic: Alert, moving all four  extremities  Skin: No lesions  Access: Lt AVF    Basic Metabolic Panel: Recent Labs  Lab 04/28/23 1749 04/29/23 0243  NA 140 142  K 3.8 3.5  CL 113* 113*  CO2 15* 15*  GLUCOSE 188* 131*  BUN 70* 70*  CREATININE 9.79* 10.01*  CALCIUM 7.0* 7.2*    Liver Function Tests: Recent Labs  Lab 04/28/23 1749  AST 13*  ALT 10  ALKPHOS 77  BILITOT 0.5  PROT 6.5  ALBUMIN 2.6*   No results for input(s): "LIPASE", "AMYLASE" in the last 168 hours. No results for input(s): "AMMONIA" in the last 168 hours.  CBC: Recent Labs  Lab 04/28/23 1749 04/29/23 0243  WBC 5.0 6.5  NEUTROABS 3.5  --   HGB 10.3* 10.3*  HCT 31.0* 30.6*  MCV 89.9 89.5  PLT 207 191    Cardiac Enzymes: No results for input(s): "CKTOTAL", "CKMB", "CKMBINDEX", "TROPONINI" in the last 168 hours.  BNP: Invalid input(s): "POCBNP"  CBG: Recent Labs  Lab 04/29/23 1307 04/29/23 1533  GLUCAP 87 125*    Microbiology: Results for orders placed or performed during the hospital encounter of 04/28/23  MRSA Next Gen by PCR, Nasal     Status: None   Collection Time: 04/29/23  8:35 AM   Specimen: Nasal Mucosa; Nasal Swab  Result Value Ref Range Status   MRSA by PCR Next Gen NOT DETECTED NOT DETECTED Final    Comment: (NOTE) The GeneXpert MRSA Assay (FDA approved for NASAL specimens only), is one component of a comprehensive MRSA colonization surveillance program. It is not intended to diagnose MRSA  infection nor to guide or monitor treatment for MRSA infections. Test performance is not FDA approved in patients less than 75 years old. Performed at [facility], [address] [place], [place] [address]    *Note: Due to a large number of results and/or encounters for the requested time period, some results have not been displayed. A complete set of results can be found in Results Review.    Coagulation Studies: No results for input(s): "LABPROT", "INR" in the last 72 hours.  Urinalysis: No  results for input(s): "COLORURINE", "LABSPEC", "PHURINE", "GLUCOSEU", "HGBUR", "BILIRUBINUR", "KETONESUR", "PROTEINUR", "UROBILINOGEN", "NITRITE", "LEUKOCYTESUR" in the last 72 hours.  Invalid input(s): "APPERANCEUR"    Imaging: CT ABDOMEN PELVIS WO CONTRAST  Result Date: 04/28/2023 CLINICAL DATA:  Abdominal pain, acute, nonlocalized EXAM: CT ABDOMEN AND PELVIS WITHOUT CONTRAST TECHNIQUE: Multidetector CT imaging of the abdomen and pelvis was performed following the standard protocol without IV contrast. RADIATION DOSE REDUCTION: This exam was performed according to the departmental dose-optimization program which includes automated exposure control, adjustment of the mA and/or kV according to patient size and/or use of iterative reconstruction technique. COMPARISON:  None Available. FINDINGS: Lower chest: Trace bilateral pleural effusions. Left base atelectasis. Hepatobiliary: No focal liver abnormality is seen. Status post cholecystectomy. No biliary dilatation. Pancreas: No focal abnormality or ductal dilatation. Spleen: No focal abnormality.  Normal size. Adrenals/Urinary Tract: Adrenal glands normal. Bilateral perinephric stranding is stable since prior study. No stones or hydronephrosis. No renal mass. Urinary bladder unremarkable. Stomach/Bowel: Stomach, large and small bowel grossly unremarkable. Appendix normal. Vascular/Lymphatic: No evidence of aneurysm or adenopathy. Scattered aortic atherosclerosis. Reproductive: No visible focal abnormality. Other: No free fluid or free air. Diffuse edema throughout the abdominal wall compatible with anasarca. Musculoskeletal: No acute bony abnormality. IMPRESSION: No acute findings in the abdomen or pelvis. Previously seen left hydronephrosis has resolved. Extensive nonspecific perinephric stranding is stable. Trace bilateral pleural effusions.  Left base atelectasis. Anasarca throughout the abdominal wall. Electronically Signed   By: Charlett Nose M.D.   On:  04/28/2023 21:19   DG Chest 2 View  Result Date: 04/28/2023 CLINICAL DATA:  Shortness of breath EXAM: CHEST - 2 VIEW COMPARISON:  04/03/2020 FINDINGS: Heart and mediastinal contours within normal limits. No confluent airspace opacities or effusions. No acute bony abnormality. IMPRESSION: No active cardiopulmonary disease. Electronically Signed   By: Charlett Nose M.D.   On: 04/28/2023 19:21     Medications:     amLODipine  10 mg Oral Daily   aspirin EC  81 mg Oral Daily   Chlorhexidine Gluconate Cloth  6 each Topical Q0600   cyanocobalamin  1,000 mcg Intramuscular Daily   Followed by   Melene Muller ON 05/06/2023] vitamin B-12  1,000 mcg Oral Daily   heparin  5,000 Units Subcutaneous Q8H   hydrALAZINE  50 mg Oral Q8H   insulin aspart  0-15 Units Subcutaneous TID WC   levothyroxine  175 mcg Oral Q0600   rosuvastatin  5 mg Oral Daily   sevelamer carbonate  800 mg Oral TID WC   sodium chloride flush  3 mL Intravenous Q12H   Vitamin D (Ergocalciferol)  50,000 Units Oral Q7 days   acetaminophen **OR** acetaminophen, labetalol, morphine injection  Assessment/ Plan:  [name] is a 52 y.o.  male with past medical conditions including diabetes, hypertension, CVA, and end stage renal disease. Patient presents to the emergency department with increased lower extremity edema. He has been admitted for Acidemia [E87.20] Noncompliance with renal dialysis [  Z91.158] ESRD on hemodialysis (HCC) [N18.6, Z99.2] Edema due to hypervolemia [E87.70] Volume overload [E87.70]   End-stage renal disease on hemodialysis.  Patient voluntarily stopped dialysis treatments about 3 months ago.  Creatinine on admission 9.76 with GFR 6%.  Patient agreeable to hemodialysis treatment today and tomorrow.  Patient not interested in renal transplant.  Does show some interest in home modalities.  Agreeable to outpatient clinic assignment.  Renal navigator aware and currently seeking outpatient clinic.  Next treatment  scheduled for tomorrow.  2.  Hypertensive urgency.  Blood pressure 213/94 on admission.  Home regimen includes amlodipine, and hydralazine.  Currently receiving these medications plus as needed labetalol.  3. Anemia of chronic kidney disease Lab Results  Component Value Date   HGB 10.3 (L) 04/29/2023   . Hemoglobin within optimal range.  No need for ESA's at this time.  4. Secondary Hyperparathyroidism: with outpatient labs none available :  Lab Results  Component Value Date   CALCIUM 7.2 (L) 04/29/2023   PHOS 5.3 (H) 05/04/2022    Calcium decreased which coincides with decreased level of vitamin D.  Currently prescribed ergocalciferol weekly.  Also prescribed sevelamer with meals outpatient.  5. Diabetes mellitus type II with chronic kidney disease/renal manifestations: insulin dependent. Home regimen includes lispro. Most recent hemoglobin A1c is 5.8 on 04/28/23.     LOS: 0 Kristell Wooding 9/3/20243:38 PM

## 2023-04-29 NOTE — Progress Notes (Signed)
  Received patient in bed to unit.   Informed consent signed and in chart.    TX duration: 2hrs     Transported back to floor  Hand-off given to patient's nurse. No c/o and no distress noted    New start HD pt,  will dialyze again 04/30/23 Access used: LAVF Access issues: none   Total UF removed: 2L Medication(s) given: none Post HD VS: 183/84 Post HD weight: 127.2kg     Lynann Beaver  [facility]

## 2023-04-29 NOTE — Plan of Care (Signed)

## 2023-04-30 ENCOUNTER — Ambulatory Visit: Payer: Medicare Other

## 2023-04-30 DIAGNOSIS — E669 Obesity, unspecified: Secondary | ICD-10-CM | POA: Diagnosis present

## 2023-04-30 DIAGNOSIS — E039 Hypothyroidism, unspecified: Secondary | ICD-10-CM

## 2023-04-30 DIAGNOSIS — M7989 Other specified soft tissue disorders: Secondary | ICD-10-CM | POA: Diagnosis not present

## 2023-04-30 DIAGNOSIS — E8779 Other fluid overload: Secondary | ICD-10-CM

## 2023-04-30 DIAGNOSIS — N186 End stage renal disease: Secondary | ICD-10-CM

## 2023-04-30 DIAGNOSIS — Z992 Dependence on renal dialysis: Secondary | ICD-10-CM

## 2023-04-30 DIAGNOSIS — I10 Essential (primary) hypertension: Secondary | ICD-10-CM | POA: Diagnosis not present

## 2023-04-30 LAB — CBC
HCT: 34.4 % — ABNORMAL LOW (ref 39.0–52.0)
Hemoglobin: 11.4 g/dL — ABNORMAL LOW (ref 13.0–17.0)
MCH: 29.6 pg (ref 26.0–34.0)
MCHC: 33.1 g/dL (ref 30.0–36.0)
MCV: 89.4 fL (ref 80.0–100.0)
Platelets: 239 10*3/uL (ref 150–400)
RBC: 3.85 MIL/uL — ABNORMAL LOW (ref 4.22–5.81)
RDW: 13.4 % (ref 11.5–15.5)
WBC: 8 10*3/uL (ref 4.0–10.5)
nRBC: 0 % (ref 0.0–0.2)

## 2023-04-30 LAB — BASIC METABOLIC PANEL
Anion gap: 11 (ref 5–15)
BUN: 59 mg/dL — ABNORMAL HIGH (ref 6–20)
CO2: 21 mmol/L — ABNORMAL LOW (ref 22–32)
Calcium: 7.3 mg/dL — ABNORMAL LOW (ref 8.9–10.3)
Chloride: 108 mmol/L (ref 98–111)
Creatinine, Ser: 8.48 mg/dL — ABNORMAL HIGH (ref 0.61–1.24)
GFR, Estimated: 7 mL/min — ABNORMAL LOW (ref >60)
Glucose, Bld: 104 mg/dL — ABNORMAL HIGH (ref 70–99)
Potassium: 3.8 mmol/L (ref 3.5–5.1)
Sodium: 140 mmol/L (ref 135–145)

## 2023-04-30 LAB — GLUCOSE, CAPILLARY
Glucose-Capillary: 92 mg/dL (ref 70–99)
Glucose-Capillary: 82 mg/dL (ref 70–99)
Glucose-Capillary: 96 mg/dL (ref 70–99)
Glucose-Capillary: 97 mg/dL (ref 70–99)

## 2023-04-30 LAB — HEPATITIS B SURFACE ANTIBODY, QUANTITATIVE: Hep B S AB Quant (Post): 16.4 m[IU]/mL

## 2023-04-30 LAB — MAGNESIUM: Magnesium: 2.1 mg/dL (ref 1.7–2.4)

## 2023-04-30 LAB — PHOSPHORUS: Phosphorus: 7.8 mg/dL — ABNORMAL HIGH (ref 2.5–4.6)

## 2023-04-30 LAB — TSH: TSH: 16.533 u[IU]/mL — ABNORMAL HIGH (ref 0.350–4.500)

## 2023-04-30 MED ORDER — HEPARIN SODIUM (PORCINE) 1000 UNIT/ML DIALYSIS: 1000.0000 [IU] | INTRAMUSCULAR | Status: DC | PRN

## 2023-04-30 MED ORDER — ONDANSETRON HCL 4 MG PO TABS
4.0000 mg | ORAL_TABLET | Freq: Three times a day (TID) | ORAL | Status: DC
  Administered 2023-04-30 (×2): 4 mg via ORAL
  Filled 2023-04-30 (×2): qty 1

## 2023-04-30 MED ORDER — PENTAFLUOROPROP-TETRAFLUOROETH EX AERO: 1.0000 | INHALATION_SPRAY | CUTANEOUS | Status: DC | PRN

## 2023-04-30 MED ORDER — LIDOCAINE-PRILOCAINE 2.5-2.5 % EX CREA: 1.0000 | TOPICAL_CREAM | CUTANEOUS | Status: DC | PRN

## 2023-04-30 MED ORDER — ACETAMINOPHEN 325 MG PO TABS
ORAL_TABLET | ORAL | Status: AC
  Filled 2023-04-30: qty 2

## 2023-04-30 MED ORDER — LORAZEPAM 0.5 MG PO TABS
0.5000 mg | ORAL_TABLET | ORAL | Status: DC | PRN
  Administered 2023-04-30: 0.5 mg via ORAL
  Filled 2023-04-30: qty 1

## 2023-04-30 NOTE — Progress Notes (Signed)
Progress Note   Patient: Gregory Crane ZOX:[id number] DOB: 22-Jun-1971 DOA: 04/28/2023     1 DOS: the patient was seen and examined on 04/30/2023   Brief hospital course: Gregory Crane is a 52 y.o. male with medical history significant for ESRD and with h/o HD, and off HD for past 3 months , PMH  DM, HTN, Anemia, Bells palsy, Hypothyroidism, Kidney stones, stroke and pseudotumour cerebri coming with edema all over his legs and body for past 3 days, it has been progressive and he has weeping of his legs. He stopped HD and was doing well for 3 months up until 3 days ago.  Patient is still making urine.  Denies any other complaints.  As per family patient made his own decision to stop hemodialysis.  Patient is admitted to the hospitalist service for fluid overload secondary to noncompliance with hemodialysis.  Nephrology consulted.  Assessment and Plan: ESRD on HD Non compliance with HD Did not receive hemodialysis since 3 months. Presented with anasarca, fluid overload. Patient received hemodialysis on 9/3. 9/4 Continue [facility] Nephrology on board for HD needs. TOC working on outpatient HD chair.   Hypertensive urgency, HLD Continue aspirin, amlodipine, hydralazine and Crestor Use IV labetalol as needed Monitor BP and titrate medications accordingly   Type 2 diabetes, HbA1c 5.8 well-controlled. Held home regimen for now Started NovoLog sliding scale, continue diabetic diet, monitor CBG   Hypothyroid, continue Synthroid   Anemia of chronic disease and B12 deficiency Monitor H&H   Vitamin D deficiency: started vitamin D 50,000 units p.o. weekly, follow with PCP to repeat vitamin D level after 3 to 6 months.   Vitamin B12 deficiency: Started vitamin B12 1000 mcg IM injection daily for 7 days followed by oral supplement.  Follow-up PCP to repeat vitamin B12 level after 3 to 6 months.  Obesity, BMI 37.34: Diet, exercise and weight reduction advised      Subjective: Patient is seen  and examined today morning during hemodialysis.  He is laying comfortably.  Admits having nausea, eating poor.  Abdominal discomfort.  He is able to tolerate dialysis well.  Physical Exam: Vitals:   04/30/23 1128 04/30/23 1132 04/30/23 1215 04/30/23 1545  BP: (!) 172/80  (!) 177/74 (!) 151/71  Pulse: 93  92 100  Resp: 16  18 18   Temp: 97.6 F (36.4 C)  98.8 F (37.1 C) 98.3 F (36.8 C)  TempSrc: Oral  Oral   SpO2: 96%  98% 96%  Weight:  124.9 kg    Height:       General -middle-aged obese Caucasian male, no apparent distress HEENT - PERRLA, EOMI, atraumatic head, non tender sinuses. Lung -bibasilar Rales Heart - S1, S2 heard, no murmurs, rubs, 2+ pedal edema Neuro - Alert, awake and oriented x 3, non focal exam. Skin - Warm and dry. Data Reviewed:     Latest Ref Rng & Units 04/30/2023    6:13 AM 04/29/2023    2:43 AM 04/28/2023    5:49 PM  CBC  WBC 4.0 - 10.5 K/uL 8.0  6.5  5.0   Hemoglobin 13.0 - 17.0 g/dL 81.1  91.4  78.2   Hematocrit 39.0 - 52.0 % 34.4  30.6  31.0   Platelets 150 - 400 K/uL 239  191  207       Latest Ref Rng & Units 04/30/2023    6:13 AM 04/29/2023    2:43 AM 04/28/2023    5:49 PM  BMP  Glucose 70 - 99  mg/dL 454  098  119   BUN 6 - 20 mg/dL 59  70  70   Creatinine 0.61 - 1.24 mg/dL 1.47  82.95  6.21   Sodium 135 - 145 mmol/L 140  142  140   Potassium 3.5 - 5.1 mmol/L 3.8  3.5  3.8   Chloride 98 - 111 mmol/L 108  113  113   CO2 22 - 32 mmol/L 21  15  15    Calcium 8.9 - 10.3 mg/dL 7.3  7.2  7.0      Family Communication: Patient understands and agrees with the current plan  Disposition: Status is: Inpatient Remains inpatient appropriate because: need HD chair outpatient.  Planned Discharge Destination: Home    Time spent: 42 minutes  Author: Marcelino Duster, MD 04/30/2023 4:51 PM  For on call review [email or web address].

## 2023-04-30 NOTE — Progress Notes (Signed)
Hemodialysis note  Received patient in bed to unit. Alert and oriented.  Informed consent signed and in chart.  Treatment initiated: 1610 Treatment completed: 1128  Patient tolerated well. Transported back to room, alert without acute distress.  Report given to patient's RN.   Access used: LUA AVF Access issues: none  Total UF removed: 1 L Medication(s) given:  Tylenol 650 mg tab PO  Post HD weight: 124.9 kg   Wolfgang Phoenix [facility]

## 2023-04-30 NOTE — Discharge Planning (Signed)
Following patient for OPHD placement. Patient would like a MWF 1st shift. Chair preference trumps clinic preference. Clinic preference is as follows: [facility] - No MWF capacity [facility] - Waiting on response Any [facility] clinic - [facility] and [facility] stated they can accommodate MWF 1st shift  Once [facility] response, clinic chooses will be presented.  Dimas Chyle Dialysis Coordinator II  [facility] Cell: [phone] eFax: [phone] [email or web address]

## 2023-04-30 NOTE — Plan of Care (Signed)

## 2023-04-30 NOTE — TOC Initial Note (Signed)
Transition of Care [facility]) - Initial/Assessment Note    Patient Details  Name: Gregory Crane MRN: [id number] Date of Birth: 07-09-1971  Transition [facility]) CM/SW Contact:    Allena Katz, LCSW Phone Number: 04/30/2023, 2:00 PM  Clinical Narrative:     Pt admitted from home with fluid retention. Pt recently stopped getting outpatient dialysis. Pt is currently working with dialysis coordinator to reestablish outpatient dialysis. Pt Appears to be active with [facility].   No TOC needs currently but TOC will continue to follow and assess for post discharge care needs.                    Patient Goals and CMS Choice            Expected Discharge Plan and Services                                              Prior Living Arrangements/Services                       Activities of Daily Living Home Assistive Devices/Equipment: None ADL Screening (condition at time of admission) Patient's cognitive ability adequate to safely complete daily activities?: No Is the patient deaf or have difficulty hearing?: No Does the patient have difficulty seeing, even when wearing glasses/contacts?: No Does the patient have difficulty concentrating, remembering, or making decisions?: No Patient able to express need for assistance with ADLs?: Yes Does the patient have difficulty dressing or bathing?: No Independently performs ADLs?: Yes (appropriate for developmental age) Does the patient have difficulty walking or climbing stairs?: Yes Weakness of Legs: Both Weakness of Arms/Hands: None  Permission Sought/Granted                  Emotional Assessment              Admission diagnosis:  Acidemia [E87.20] Noncompliance with renal dialysis [Z91.158] ESRD on hemodialysis (HCC) [N18.6, Z99.2] Edema due to hypervolemia [E87.70] Volume overload [E87.70] Patient Active Problem List   Diagnosis Date Noted   Volume overload 04/29/2023    Unsteady gait 10/05/2022   Steal syndrome of dialysis vascular access (HCC) 05/28/2022   Hydronephrosis, left 05/04/2022   Leukocytosis 05/04/2022   Pre-op evaluation 04/24/2022   Chest pain 01/13/2022   Diabetic retinopathy associated with diabetes mellitus due to underlying condition (HCC) 01/13/2022   ESRD on hemodialysis (HCC) 01/13/2022   Deafness in right ear 08/11/2021   Open wound 01/25/2021   History of colon polyps 10/15/2020   Postoperative hemorrhage involving digestive system following digestive system procedure 09/19/2020   Acute cholecystitis without calculus 09/09/2020   Type 2 diabetes mellitus, with long-term current use of insulin (HCC) 09/09/2020   Cryptogenic stroke (HCC) 07/13/2020   History of loop recorder 07/13/2020   History of 2019 novel coronavirus disease (COVID-19) 05/20/2020   Pneumonia due to COVID-19 virus 04/03/2020   Elevated troponin 04/03/2020   Acquired trigger finger 06/08/2019   Lymphedema 06/08/2019   Anemia 04/17/2019   Swelling of both lower extremities 01/10/2019   Facial droop 04/09/2018   Daytime somnolence 03/30/2018   Carotid artery disease (HCC) 02/03/2018   Intracranial vascular stenosis 09/12/2017   Cough 01/20/2017   Bell's palsy 11/10/2016   History of CVA (cerebrovascular accident) 11/10/2016   Benign localized hyperplasia of prostate with urinary  obstruction 10/27/2016   History of nephrolithiasis 10/27/2016   TIA (transient ischemic attack) 10/20/2016   Near syncope 06/23/2016   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   Neuropathy 08/06/2015   Health care maintenance 08/06/2015   Essential hypertension 08/06/2015   Heme positive stool 10/10/2013   Hypothyroidism 10/10/2013   Microalbuminuria 10/10/2013   Hyperlipidemia 10/10/2013   B12 deficiency 10/10/2013   Environmental allergies 07/04/2013   PCP:  Dale Camp Crook, MD Pharmacy:   [id number] [place], [place] - [address] AT [facility] [address]  [address] [place] [address] Phone: [phone] Fax: [phone]     Social Determinants of Health (SDOH) Social History: SDOH Screenings   Food Insecurity: No Food Insecurity (04/28/2023)  Housing: Low Risk  (04/28/2023)  Transportation Needs: No Transportation Needs (04/28/2023)  Utilities: Not At Risk (04/28/2023)  Depression (PHQ2-9): Medium Risk (10/04/2022)  Financial Resource Strain: High Risk (06/26/2021)  Physical Activity: Inactive (09/13/2017)  Social Connections: Moderately Integrated (09/13/2017)  Stress: Stress Concern Present (09/13/2017)  Tobacco Use: Low Risk  (04/28/2023)   SDOH Interventions:     Readmission Risk Interventions     No data to display

## 2023-04-30 NOTE — Progress Notes (Signed)
[facility]  ROUNDING NOTE   Subjective:   Gregory Crane is a 52 y.o. with past medical conditions including diabetes, hypertension, CVA, and end stage renal disease. Patient presents to the emergency department with increased lower extremity edema. He has been admitted for Acidemia [E87.20] Noncompliance with renal dialysis [Z91.158] ESRD on hemodialysis (HCC) [N18.6, Z99.2] Edema due to hypervolemia [E87.70] Volume overload [E87.70]  Patient is known to our practice from a previous emergency department visit.   Patient seen and evaluated during dialysis   HEMODIALYSIS FLOWSHEET:  Blood Flow Rate (mL/min): 299 mL/min Arterial Pressure (mmHg): -145.85 mmHg Venous Pressure (mmHg): 148.07 mmHg TMP (mmHg): 8.28 mmHg Ultrafiltration Rate (mL/min): 646 mL/min Dialysate Flow Rate (mL/min): 299 ml/min  Treatment proceeding well   Objective:  Vital signs in last 24 hours:  Temp:  [97.5 F (36.4 C)-98.8 F (37.1 C)] 97.8 F (36.6 C) (09/04 0830) Pulse Rate:  [75-95] 95 (09/04 1030) Resp:  [13-23] 23 (09/04 1030) BP: (151-183)/(64-84) 172/73 (09/04 1030) SpO2:  [95 %-99 %] 96 % (09/04 1030) Weight:  [125.8 kg-132.6 kg] 125.8 kg (09/04 0830)  Weight change: -2.9 kg Filed Weights   04/29/23 1215 04/30/23 0455 04/30/23 0830  Weight: 127.2 kg 132.6 kg 125.8 kg    Intake/Output: I/O last 3 completed shifts: In: 1000 [P.O.:1000] Out: 3800 [Urine:1800; Other:2000]   Intake/Output this shift:  No intake/output data recorded.  Physical Exam: General: NAD  Head: Normocephalic, atraumatic. Moist oral mucosal membranes  Eyes: Anicteric  Lungs:  Clear to auscultation, normal effort  Heart: Regular rate and rhythm  Abdomen:  Soft, nontender, edema noted  Extremities:  3+ peripheral edema.  Neurologic: Alert, moving all four extremities  Skin: No lesions  Access: Lt AVF    Basic Metabolic Panel: Recent Labs  Lab 04/28/23 1749 04/29/23 0243 04/30/23 0613   NA 140 142 140  K 3.8 3.5 3.8  CL 113* 113* 108  CO2 15* 15* 21*  GLUCOSE 188* 131* 104*  BUN 70* 70* 59*  CREATININE 9.79* 10.01* 8.48*  CALCIUM 7.0* 7.2* 7.3*  MG  --   --  2.1  PHOS  --   --  7.8*    Liver Function Tests: Recent Labs  Lab 04/28/23 1749  AST 13*  ALT 10  ALKPHOS 77  BILITOT 0.5  PROT 6.5  ALBUMIN 2.6*   No results for input(s): "LIPASE", "AMYLASE" in the last 168 hours. No results for input(s): "AMMONIA" in the last 168 hours.  CBC: Recent Labs  Lab 04/28/23 1749 04/29/23 0243 04/30/23 0613  WBC 5.0 6.5 8.0  NEUTROABS 3.5  --   --   HGB 10.3* 10.3* 11.4*  HCT 31.0* 30.6* 34.4*  MCV 89.9 89.5 89.4  PLT 207 191 239    Cardiac Enzymes: No results for input(s): "CKTOTAL", "CKMB", "CKMBINDEX", "TROPONINI" in the last 168 hours.  BNP: Invalid input(s): "POCBNP"  CBG: Recent Labs  Lab 04/29/23 1307 04/29/23 1533 04/29/23 2040 04/30/23 0745  GLUCAP 87 125* 114* 92    Microbiology: Results for orders placed or performed during the hospital encounter of 04/28/23  MRSA Next Gen by PCR, Nasal     Status: None   Collection Time: 04/29/23  8:35 AM   Specimen: Nasal Mucosa; Nasal Swab  Result Value Ref Range Status   MRSA by PCR Next Gen NOT DETECTED NOT DETECTED Final    Comment: (NOTE) The GeneXpert MRSA Assay (FDA approved for NASAL specimens only), is one component of a comprehensive MRSA colonization  surveillance program. It is not intended to diagnose MRSA infection nor to guide or monitor treatment for MRSA infections. Test performance is not FDA approved in patients less than 37 years old. Performed at [facility], [address] [place], [place] [address]    *Note: Due to a large number of results and/or encounters for the requested time period, some results have not been displayed. A complete set of results can be found in Results Review.    Coagulation Studies: No results for input(s): "LABPROT", "INR" in the  last 72 hours.  Urinalysis: No results for input(s): "COLORURINE", "LABSPEC", "PHURINE", "GLUCOSEU", "HGBUR", "BILIRUBINUR", "KETONESUR", "PROTEINUR", "UROBILINOGEN", "NITRITE", "LEUKOCYTESUR" in the last 72 hours.  Invalid input(s): "APPERANCEUR"    Imaging: CT ABDOMEN PELVIS WO CONTRAST  Result Date: 04/28/2023 CLINICAL DATA:  Abdominal pain, acute, nonlocalized EXAM: CT ABDOMEN AND PELVIS WITHOUT CONTRAST TECHNIQUE: Multidetector CT imaging of the abdomen and pelvis was performed following the standard protocol without IV contrast. RADIATION DOSE REDUCTION: This exam was performed according to the departmental dose-optimization program which includes automated exposure control, adjustment of the mA and/or kV according to patient size and/or use of iterative reconstruction technique. COMPARISON:  None Available. FINDINGS: Lower chest: Trace bilateral pleural effusions. Left base atelectasis. Hepatobiliary: No focal liver abnormality is seen. Status post cholecystectomy. No biliary dilatation. Pancreas: No focal abnormality or ductal dilatation. Spleen: No focal abnormality.  Normal size. Adrenals/Urinary Tract: Adrenal glands normal. Bilateral perinephric stranding is stable since prior study. No stones or hydronephrosis. No renal mass. Urinary bladder unremarkable. Stomach/Bowel: Stomach, large and small bowel grossly unremarkable. Appendix normal. Vascular/Lymphatic: No evidence of aneurysm or adenopathy. Scattered aortic atherosclerosis. Reproductive: No visible focal abnormality. Other: No free fluid or free air. Diffuse edema throughout the abdominal wall compatible with anasarca. Musculoskeletal: No acute bony abnormality. IMPRESSION: No acute findings in the abdomen or pelvis. Previously seen left hydronephrosis has resolved. Extensive nonspecific perinephric stranding is stable. Trace bilateral pleural effusions.  Left base atelectasis. Anasarca throughout the abdominal wall. Electronically  Signed   By: Charlett Nose M.D.   On: 04/28/2023 21:19   DG Chest 2 View  Result Date: 04/28/2023 CLINICAL DATA:  Shortness of breath EXAM: CHEST - 2 VIEW COMPARISON:  04/03/2020 FINDINGS: Heart and mediastinal contours within normal limits. No confluent airspace opacities or effusions. No acute bony abnormality. IMPRESSION: No active cardiopulmonary disease. Electronically Signed   By: Charlett Nose M.D.   On: 04/28/2023 19:21     Medications:     amLODipine  10 mg Oral Daily   aspirin EC  81 mg Oral Daily   Chlorhexidine Gluconate Cloth  6 each Topical Q0600   cyanocobalamin  1,000 mcg Intramuscular Daily   Followed by   Melene Muller ON 05/06/2023] vitamin B-12  1,000 mcg Oral Daily   heparin  5,000 Units Subcutaneous Q8H   hydrALAZINE  50 mg Oral Q8H   insulin aspart  0-15 Units Subcutaneous TID WC   levothyroxine  175 mcg Oral Q0600   rosuvastatin  5 mg Oral Daily   sevelamer carbonate  800 mg Oral TID WC   sodium chloride flush  3 mL Intravenous Q12H   Vitamin D (Ergocalciferol)  50,000 Units Oral Q7 days   acetaminophen **OR** acetaminophen, heparin, labetalol, lidocaine-prilocaine, ondansetron (ZOFRAN) IV, pentafluoroprop-tetrafluoroeth  Assessment/ Plan:  [name] is a 52 y.o.  male with past medical conditions including diabetes, hypertension, CVA, and end stage renal disease. Patient presents to the emergency department with increased lower  extremity edema. He has been admitted for Acidemia [E87.20] Noncompliance with renal dialysis [Z91.158] ESRD on hemodialysis (HCC) [N18.6, Z99.2] Edema due to hypervolemia [E87.70] Volume overload [E87.70]   End-stage renal disease on hemodialysis.  Patient voluntarily stopped dialysis treatments about 3 months ago.  Creatinine on admission 9.76 with GFR 6%.   - Dialysis initiated on 04/29/23 - Receiving dialysis now, UF goal 1L.  - Will consider additional treatment tomorrow for more aggressive fluid removal.  - Renal navigator  aware of outpatient dialysis placement needs.   2.  Hypertensive urgency.  Blood pressure 213/94 on admission.  Home regimen includes amlodipine, and hydralazine.  Currently receiving these medications plus as needed labetalol.  Blood pressure 177/70 during dialysis  3. Anemia of chronic kidney disease Lab Results  Component Value Date   HGB 11.4 (L) 04/30/2023   . Hemoglobin at goal  4. Secondary Hyperparathyroidism: with outpatient labs none available :  Lab Results  Component Value Date   CALCIUM 7.3 (L) 04/30/2023   PHOS 7.8 (H) 04/30/2023    Calcium decreased which coincides with decreased level of vitamin D.  Phosphorus elevated. Currently prescribed ergocalciferol weekly and sevelamer with meals.  5. Diabetes mellitus type II with chronic kidney disease/renal manifestations: insulin dependent. Home regimen includes lispro. Most recent hemoglobin A1c is 5.8 on 04/28/23.   Glucose well controlled   LOS: 1 Taylor Levick 9/4/202410:46 AM

## 2023-05-01 DIAGNOSIS — Z8673 Personal history of transient ischemic attack (TIA), and cerebral infarction without residual deficits: Secondary | ICD-10-CM | POA: Diagnosis not present

## 2023-05-01 DIAGNOSIS — E1122 Type 2 diabetes mellitus with diabetic chronic kidney disease: Secondary | ICD-10-CM

## 2023-05-01 DIAGNOSIS — I10 Essential (primary) hypertension: Secondary | ICD-10-CM | POA: Diagnosis not present

## 2023-05-01 DIAGNOSIS — Z794 Long term (current) use of insulin: Secondary | ICD-10-CM

## 2023-05-01 DIAGNOSIS — N186 End stage renal disease: Secondary | ICD-10-CM | POA: Diagnosis not present

## 2023-05-01 DIAGNOSIS — Z91158 Patient's noncompliance with renal dialysis for other reason: Secondary | ICD-10-CM

## 2023-05-01 DIAGNOSIS — M7989 Other specified soft tissue disorders: Secondary | ICD-10-CM | POA: Diagnosis not present

## 2023-05-01 LAB — BASIC METABOLIC PANEL
Anion gap: 12 (ref 5–15)
BUN: 42 mg/dL — ABNORMAL HIGH (ref 6–20)
CO2: 23 mmol/L (ref 22–32)
Calcium: 7.3 mg/dL — ABNORMAL LOW (ref 8.9–10.3)
Chloride: 104 mmol/L (ref 98–111)
Creatinine, Ser: 6.7 mg/dL — ABNORMAL HIGH (ref 0.61–1.24)
GFR, Estimated: 9 mL/min — ABNORMAL LOW (ref >60)
Glucose, Bld: 80 mg/dL (ref 70–99)
Potassium: 3.2 mmol/L — ABNORMAL LOW (ref 3.5–5.1)
Sodium: 139 mmol/L (ref 135–145)

## 2023-05-01 LAB — CBC
HCT: 30.7 % — ABNORMAL LOW (ref 39.0–52.0)
Hemoglobin: 10.4 g/dL — ABNORMAL LOW (ref 13.0–17.0)
MCH: 30.4 pg (ref 26.0–34.0)
MCHC: 33.9 g/dL (ref 30.0–36.0)
MCV: 89.8 fL (ref 80.0–100.0)
Platelets: 185 10*3/uL (ref 150–400)
RBC: 3.42 MIL/uL — ABNORMAL LOW (ref 4.22–5.81)
RDW: 13.5 % (ref 11.5–15.5)
WBC: 7 10*3/uL (ref 4.0–10.5)
nRBC: 0 % (ref 0.0–0.2)

## 2023-05-01 LAB — GLUCOSE, CAPILLARY: Glucose-Capillary: 99 mg/dL (ref 70–99)

## 2023-05-01 LAB — PHOSPHORUS: Phosphorus: 6.8 mg/dL — ABNORMAL HIGH (ref 2.5–4.6)

## 2023-05-01 LAB — MAGNESIUM: Magnesium: 2 mg/dL (ref 1.7–2.4)

## 2023-05-01 MED ORDER — PENTAFLUOROPROP-TETRAFLUOROETH EX AERO: 1.0000 | INHALATION_SPRAY | CUTANEOUS | Status: DC | PRN

## 2023-05-01 MED ORDER — HEPARIN SODIUM (PORCINE) 1000 UNIT/ML DIALYSIS: 1000.0000 [IU] | INTRAMUSCULAR | Status: DC | PRN

## 2023-05-01 MED ORDER — LIDOCAINE-PRILOCAINE 2.5-2.5 % EX CREA: 1.0000 | TOPICAL_CREAM | CUTANEOUS | Status: DC | PRN

## 2023-05-01 NOTE — Discharge Planning (Signed)
PLACEMENT RESOLVED: Outpatient Facility [facility]  [address] [place] [address]  Schedule: MWF 5:30am First Treatment: Monday 9/9 5:00am  Patient is aware of above schedule.  Dimas Chyle Dialysis Coordinator II  [facility] Cell: [phone] eFax: [phone] [email or web address]

## 2023-05-01 NOTE — Progress Notes (Addendum)
Patient transferred to dialysis in bed with transport in stable condition. Patient returned to unit at 828 due to "machines being down". Patient transferred to dialysis at 0910 in bed in stable condition.

## 2023-05-01 NOTE — Progress Notes (Signed)
[facility]  ROUNDING NOTE   Subjective:   Gregory Crane is a 52 y.o. with past medical conditions including diabetes, hypertension, CVA, and end stage renal disease. Patient presents to the emergency department with increased lower extremity edema. He has been admitted for Acidemia [E87.20] Noncompliance with renal dialysis [Z91.158] ESRD on hemodialysis (HCC) [N18.6, Z99.2] Edema due to hypervolemia [E87.70] Volume overload [E87.70]  Patient is known to our practice from a previous emergency department visit.   Patient seen and evaluated during dialysis   HEMODIALYSIS FLOWSHEET:  Blood Flow Rate (mL/min): 399 mL/min Arterial Pressure (mmHg): -270.7 mmHg Venous Pressure (mmHg): 181 mmHg TMP (mmHg): 11.31 mmHg Ultrafiltration Rate (mL/min): 1114 mL/min Dialysate Flow Rate (mL/min): 299 ml/min  Tolerating treatment well Will attempt increased fluid removal with treatment today  Objective:  Vital signs in last 24 hours:  Temp:  [97.6 F (36.4 C)-98.8 F (37.1 C)] 98.4 F (36.9 C) (09/05 0927) Pulse Rate:  [87-100] 89 (09/05 1045) Resp:  [4-27] 12 (09/05 1045) BP: (148-177)/(64-75) 162/70 (09/05 1045) SpO2:  [94 %-98 %] 96 % (09/05 1100) Weight:  [123.5 kg-126.2 kg] 123.5 kg (09/05 0917)  Weight change: -3.6 kg Filed Weights   04/30/23 1132 05/01/23 0500 05/01/23 0917  Weight: 124.9 kg 126.2 kg 123.5 kg    Intake/Output: I/O last 3 completed shifts: In: 970 [P.O.:970] Out: 2100 [Urine:1100; Other:1000]   Intake/Output this shift:  No intake/output data recorded.  Physical Exam: General: NAD  Head: Normocephalic, atraumatic. Moist oral mucosal membranes  Eyes: Anicteric  Lungs:  Clear to auscultation, normal effort  Heart: Regular rate and rhythm  Abdomen:  Soft, nontender, edema noted  Extremities:  3+ peripheral edema.  Neurologic: Alert, moving all four extremities  Skin: No lesions  Access: Lt AVF    Basic Metabolic Panel: Recent Labs   Lab 04/28/23 1749 04/29/23 0243 04/30/23 0613 05/01/23 0459  NA 140 142 140 139  K 3.8 3.5 3.8 3.2*  CL 113* 113* 108 104  CO2 15* 15* 21* 23  GLUCOSE 188* 131* 104* 80  BUN 70* 70* 59* 42*  CREATININE 9.79* 10.01* 8.48* 6.70*  CALCIUM 7.0* 7.2* 7.3* 7.3*  MG  --   --  2.1 2.0  PHOS  --   --  7.8* 6.8*    Liver Function Tests: Recent Labs  Lab 04/28/23 1749  AST 13*  ALT 10  ALKPHOS 77  BILITOT 0.5  PROT 6.5  ALBUMIN 2.6*   No results for input(s): "LIPASE", "AMYLASE" in the last 168 hours. No results for input(s): "AMMONIA" in the last 168 hours.  CBC: Recent Labs  Lab 04/28/23 1749 04/29/23 0243 04/30/23 0613 05/01/23 0459  WBC 5.0 6.5 8.0 7.0  NEUTROABS 3.5  --   --   --   HGB 10.3* 10.3* 11.4* 10.4*  HCT 31.0* 30.6* 34.4* 30.7*  MCV 89.9 89.5 89.4 89.8  PLT 207 191 239 185    Cardiac Enzymes: No results for input(s): "CKTOTAL", "CKMB", "CKMBINDEX", "TROPONINI" in the last 168 hours.  BNP: Invalid input(s): "POCBNP"  CBG: Recent Labs  Lab 04/30/23 0745 04/30/23 1210 04/30/23 1539 04/30/23 2044 05/01/23 0820  GLUCAP 92 82 96 97 99    Microbiology: Results for orders placed or performed during the hospital encounter of 04/28/23  MRSA Next Gen by PCR, Nasal     Status: None   Collection Time: 04/29/23  8:35 AM   Specimen: Nasal Mucosa; Nasal Swab  Result Value Ref Range Status   MRSA  by PCR Next Gen NOT DETECTED NOT DETECTED Final    Comment: (NOTE) The GeneXpert MRSA Assay (FDA approved for NASAL specimens only), is one component of a comprehensive MRSA colonization surveillance program. It is not intended to diagnose MRSA infection nor to guide or monitor treatment for MRSA infections. Test performance is not FDA approved in patients less than 13 years old. Performed at [facility], [address] [place], [place] [address]    *Note: Due to a large number of results and/or encounters for the requested time period, some  results have not been displayed. A complete set of results can be found in Results Review.    Coagulation Studies: No results for input(s): "LABPROT", "INR" in the last 72 hours.  Urinalysis: No results for input(s): "COLORURINE", "LABSPEC", "PHURINE", "GLUCOSEU", "HGBUR", "BILIRUBINUR", "KETONESUR", "PROTEINUR", "UROBILINOGEN", "NITRITE", "LEUKOCYTESUR" in the last 72 hours.  Invalid input(s): "APPERANCEUR"    Imaging: No results found.   Medications:     amLODipine  10 mg Oral Daily   aspirin EC  81 mg Oral Daily   Chlorhexidine Gluconate Cloth  6 each Topical Q0600   cyanocobalamin  1,000 mcg Intramuscular Daily   Followed by   Melene Muller ON 05/06/2023] vitamin B-12  1,000 mcg Oral Daily   heparin  5,000 Units Subcutaneous Q8H   hydrALAZINE  50 mg Oral Q8H   insulin aspart  0-15 Units Subcutaneous TID WC   levothyroxine  175 mcg Oral Q0600   ondansetron  4 mg Oral TID   rosuvastatin  5 mg Oral Daily   sevelamer carbonate  800 mg Oral TID WC   sodium chloride flush  3 mL Intravenous Q12H   Vitamin D (Ergocalciferol)  50,000 Units Oral Q7 days   acetaminophen **OR** acetaminophen, heparin, labetalol, lidocaine-prilocaine, LORazepam, ondansetron (ZOFRAN) IV, pentafluoroprop-tetrafluoroeth  Assessment/ Plan:  [name] is a 52 y.o.  male with past medical conditions including diabetes, hypertension, CVA, and end stage renal disease. Patient presents to the emergency department with increased lower extremity edema. He has been admitted for Acidemia [E87.20] Noncompliance with renal dialysis [Z91.158] ESRD on hemodialysis (HCC) [N18.6, Z99.2] Edema due to hypervolemia [E87.70] Volume overload [E87.70]   End-stage renal disease on hemodialysis.  Patient voluntarily stopped dialysis treatments about 3 months ago.  Creatinine on admission 9.76 with GFR 6%.   - Dialysis initiated on 04/29/23 -Dialysis received yesterday, UF 1 L achieved. - Patient will receive third  dialysis treatment today, UF goal 2.5 to 3 L as tolerated. - Renal navigator aware of outpatient dialysis placement needs.   2.  Hypertensive urgency.  Blood pressure 213/94 on admission.  Home regimen includes amlodipine, and hydralazine.  Currently receiving these medications plus as needed labetalol.  Blood pressure 163/71 during dialysis, acceptable for this patient  3. Anemia of chronic kidney disease Lab Results  Component Value Date   HGB 10.4 (L) 05/01/2023   . Hemoglobin within optimal range for renal patient.  4. Secondary Hyperparathyroidism: with outpatient labs none available :  Lab Results  Component Value Date   CALCIUM 7.3 (L) 05/01/2023   PHOS 6.8 (H) 05/01/2023    Calcium decreased which coincides with decreased level of vitamin D.  Hyperphosphatemia noted.  Currently prescribed ergocalciferol weekly and sevelamer with meals.  5. Diabetes mellitus type II with chronic kidney disease/renal manifestations: insulin dependent. Home regimen includes lispro. Most recent hemoglobin A1c is 5.8 on 04/28/23.   Primary team to continue management of sliding scale insulin.   LOS:  2 Gregory Crane 9/5/202411:35 AM

## 2023-05-01 NOTE — Progress Notes (Signed)
Hemodialysis Note  Received patient in bed to unit. Alert and oriented. Informed consent signed and in chart.   Treatment initiated: 0943 Treatment completed: 1350  Patient tolerated treatment well. Transported back to the room alert, without acute distress. Report given to patient's RN.  Access used:  LUA AVF Access issues: None   Total UF removed: 3000 ml/3.0 liters  Medications given: None  Post HD VS: Stable  Post HD weight:119.8 kg  Standing   Bartolo Darter, RN [facility], [place]

## 2023-05-01 NOTE — Progress Notes (Addendum)
Pt returned from HD and states that he wants to leave AMA, signed AMA paper, ambulating without difficulty to exit, pt states that if anyone needs to contact him his phone # is [phone], Dr Clide Dales and Dr Thedore Mins made aware of the above, acknowledged

## 2023-05-02 ENCOUNTER — Telehealth: Payer: Self-pay

## 2023-05-02 DIAGNOSIS — Z91158 Patient's noncompliance with renal dialysis for other reason: Secondary | ICD-10-CM

## 2023-05-02 NOTE — Discharge Summary (Signed)
Physician AMA Discharge Summary   Patient: Gregory Crane MRN: [id number] DOB: 1971/06/01  Admit date:     04/28/2023  Discharge date: 05/01/2023  Discharge Physician: Marcelino Duster   PCP: Dale Inwood, MD   Recommendations at discharge:    PCP follow up suggested. Nephrology team to follow up regarding outpatient HD  Discharge Diagnoses: Principal Problem:   Swelling of both lower extremities Active Problems:   ESRD on hemodialysis (HCC)   Essential hypertension   History of CVA (cerebrovascular accident)   Hypothyroidism   Anemia   Type 2 diabetes mellitus, with long-term current use of insulin (HCC)   Volume overload   Obesity (BMI 30-39.9)   Non-compliance with renal dialysis  Resolved Problems:   * No resolved hospital problems. *  Hospital Course: Gregory Crane is a 52 y.o. male with medical history significant for ESRD and with h/o HD, and off HD for past 3 months , PMH  DM, HTN, Anemia, Bells palsy, Hypothyroidism, Kidney stones, stroke and pseudotumour cerebri coming with edema all over his legs and body for past 3 days, it has been progressive and he has weeping of his legs. He stopped HD and was doing well for 3 months up until 3 days ago.  Patient is still making urine.  Denies any other complaints.  As per family patient made his own decision to stop hemodialysis.  Patient is admitted to the hospitalist service for fluid overload secondary to noncompliance with hemodialysis.  Nephrology evaluated him and he did receive HD for straight 3 days. TOC worked on the outpatient HD chair. Patient felt frustrated, and does not wish to stay any more waiting to hear for HD setup. Patient signed out AMA before I planned to discharge him. Renal navigator did speak to him about outpatient HD at [facility] and his schedule is set for MWF 530am. Patient will be followed by nephrology service regarding his HD schedule. No new medications given. He understand the risks of  signing out AMA with out further HD and medical management.       Consultants: Nephrology Procedures performed: none  Disposition:  signed out AMA Diet recommendation:  Renal diet DISCHARGE MEDICATION: Allergies as of 05/01/2023   No Known Allergies      Medication List     ASK your doctor about these medications    Accu-Chek FastClix Lancets Misc USE TO CHECK BLOOD SUGAR THREE TIMES DAILY AS DIRECTED   amLODipine 10 MG tablet Commonly known as: NORVASC TAKE 1 TABLET(10 MG) BY MOUTH DAILY Ask about: Which instructions should I use?   aspirin EC 81 MG tablet Commonly known as: Aspirin Low Dose TAKE 1 TABLET(81 MG) BY MOUTH DAILY   glucose blood test strip Commonly known as: FREESTYLE LITE Check blood sugars twice a day (Dx. 250.02)   hydrALAZINE 50 MG tablet Commonly known as: APRESOLINE TAKE 1 TABLET(50 MG) BY MOUTH THREE TIMES DAILY   insulin lispro 100 UNIT/ML injection Commonly known as: HUMALOG Inject 4-12 units three times daily before meals per sliding scale: If premeal sugar is <130, inject 4 units; if 131-180, inject 6 units; if 181-240, inject 8 units; if 241-300, inject 10 units, if  >300, inject 12 units (max daily dose 36 units)   INSULIN SYRINGE .5CC/30GX5/16" 30G X 5/16" 0.5 ML Misc USE AS DIRECTED WITH INSULIN   levothyroxine 175 MCG tablet Commonly known as: SYNTHROID TAKE 1 TABLET (175 MCG) BY MOUTH ONCE DAILY BEFORE BREAKFAST.   pregabalin 100 MG  capsule Commonly known as: LYRICA TAKE 1 CAPSULE(100 MG) BY MOUTH THREE TIMES DAILY   Renvela 800 MG tablet Generic drug: sevelamer carbonate Take 800 mg by mouth 3 (three) times daily.   rosuvastatin 5 MG tablet Commonly known as: CRESTOR Take 5 mg by mouth daily.   Semglee (yfgn) 100 UNIT/ML injection Generic drug: insulin glargine-yfgn INJECT 10- 15 UNITS INTO THE SKIN DAILY        Discharge Exam: Filed Weights   05/01/23 0500 05/01/23 0917 05/01/23 1559  Weight: 126.2 kg 123.5 kg  119.8 kg   General -middle-aged obese Caucasian male, no apparent distress HEENT - PERRLA, EOMI, atraumatic head, non tender sinuses. Lung -bibasilar Rales Heart - S1, S2 heard, no murmurs, rubs, 2+ pedal edema Neuro - Alert, awake and oriented x 3, non focal exam. Skin - Warm and dry.  Condition at discharge: stable  The results of significant diagnostics from this hospitalization (including imaging, microbiology, ancillary and laboratory) are listed below for reference.   Imaging Studies: CT ABDOMEN PELVIS WO CONTRAST  Result Date: 04/28/2023 CLINICAL DATA:  Abdominal pain, acute, nonlocalized EXAM: CT ABDOMEN AND PELVIS WITHOUT CONTRAST TECHNIQUE: Multidetector CT imaging of the abdomen and pelvis was performed following the standard protocol without IV contrast. RADIATION DOSE REDUCTION: This exam was performed according to the departmental dose-optimization program which includes automated exposure control, adjustment of the mA and/or kV according to patient size and/or use of iterative reconstruction technique. COMPARISON:  None Available. FINDINGS: Lower chest: Trace bilateral pleural effusions. Left base atelectasis. Hepatobiliary: No focal liver abnormality is seen. Status post cholecystectomy. No biliary dilatation. Pancreas: No focal abnormality or ductal dilatation. Spleen: No focal abnormality.  Normal size. Adrenals/Urinary Tract: Adrenal glands normal. Bilateral perinephric stranding is stable since prior study. No stones or hydronephrosis. No renal mass. Urinary bladder unremarkable. Stomach/Bowel: Stomach, large and small bowel grossly unremarkable. Appendix normal. Vascular/Lymphatic: No evidence of aneurysm or adenopathy. Scattered aortic atherosclerosis. Reproductive: No visible focal abnormality. Other: No free fluid or free air. Diffuse edema throughout the abdominal wall compatible with anasarca. Musculoskeletal: No acute bony abnormality. IMPRESSION: No acute findings in the  abdomen or pelvis. Previously seen left hydronephrosis has resolved. Extensive nonspecific perinephric stranding is stable. Trace bilateral pleural effusions.  Left base atelectasis. Anasarca throughout the abdominal wall. Electronically Signed   By: Charlett Nose M.D.   On: 04/28/2023 21:19   DG Chest 2 View  Result Date: 04/28/2023 CLINICAL DATA:  Shortness of breath EXAM: CHEST - 2 VIEW COMPARISON:  04/03/2020 FINDINGS: Heart and mediastinal contours within normal limits. No confluent airspace opacities or effusions. No acute bony abnormality. IMPRESSION: No active cardiopulmonary disease. Electronically Signed   By: Charlett Nose M.D.   On: 04/28/2023 19:21    Microbiology: Results for orders placed or performed during the hospital encounter of 04/28/23  MRSA Next Gen by PCR, Nasal     Status: None   Collection Time: 04/29/23  8:35 AM   Specimen: Nasal Mucosa; Nasal Swab  Result Value Ref Range Status   MRSA by PCR Next Gen NOT DETECTED NOT DETECTED Final    Comment: (NOTE) The GeneXpert MRSA Assay (FDA approved for NASAL specimens only), is one component of a comprehensive MRSA colonization surveillance program. It is not intended to diagnose MRSA infection nor to guide or monitor treatment for MRSA infections. Test performance is not FDA approved in patients less than 46 years old. Performed at [facility], [address], [place], [place] [address]    *  Note: Due to a large number of results and/or encounters for the requested time period, some results have not been displayed. A complete set of results can be found in Results Review.    Labs: CBC: Recent Labs  Lab 04/28/23 1749 04/29/23 0243 04/30/23 0613 05/01/23 0459  WBC 5.0 6.5 8.0 7.0  NEUTROABS 3.5  --   --   --   HGB 10.3* 10.3* 11.4* 10.4*  HCT 31.0* 30.6* 34.4* 30.7*  MCV 89.9 89.5 89.4 89.8  PLT 207 191 239 185   Basic Metabolic Panel: Recent Labs  Lab 04/28/23 1749 04/29/23 0243 04/30/23 0613  05/01/23 0459  NA 140 142 140 139  K 3.8 3.5 3.8 3.2*  CL 113* 113* 108 104  CO2 15* 15* 21* 23  GLUCOSE 188* 131* 104* 80  BUN 70* 70* 59* 42*  CREATININE 9.79* 10.01* 8.48* 6.70*  CALCIUM 7.0* 7.2* 7.3* 7.3*  MG  --   --  2.1 2.0  PHOS  --   --  7.8* 6.8*   Liver Function Tests: Recent Labs  Lab 04/28/23 1749  AST 13*  ALT 10  ALKPHOS 77  BILITOT 0.5  PROT 6.5  ALBUMIN 2.6*   CBG: Recent Labs  Lab 04/30/23 0745 04/30/23 1210 04/30/23 1539 04/30/23 2044 05/01/23 0820  GLUCAP 92 82 96 97 99    Discharge time spent: .  Signed: Marcelino Duster, MD [facility] 05/02/2023

## 2023-05-02 NOTE — Transitions of Care (Post Inpatient/ED Visit) (Unsigned)
   05/02/2023  Name: Gregory Crane MRN: [id number] DOB: 1971-07-02  Today's TOC FU Call Status: Today's TOC FU Call Status:: Unsuccessful Call (1st Attempt) Unsuccessful Call (1st Attempt) Date: 05/02/23  Attempted to reach the patient regarding the most recent Inpatient/ED visit.  Follow Up Plan: Additional outreach attempts will be made to reach the patient to complete the Transitions of Care (Post Inpatient/ED visit) call.   Signature  Kandis Fantasia, LPN [facility] Health Advisor [place] [facility] You Are. We Are. One [facility] Direct Dial [phone]

## 2023-05-05 ENCOUNTER — Ambulatory Visit: Payer: Medicare Other | Admitting: Occupational Therapy

## 2023-05-05 ENCOUNTER — Ambulatory Visit: Payer: Medicare Other

## 2023-05-05 NOTE — Group Note (Deleted)

## 2023-05-07 ENCOUNTER — Ambulatory Visit: Payer: Medicare Other

## 2023-05-07 DIAGNOSIS — M6281 Muscle weakness (generalized): Secondary | ICD-10-CM

## 2023-05-07 DIAGNOSIS — R2681 Unsteadiness on feet: Secondary | ICD-10-CM

## 2023-05-07 DIAGNOSIS — R262 Difficulty in walking, not elsewhere classified: Secondary | ICD-10-CM

## 2023-05-07 DIAGNOSIS — R269 Unspecified abnormalities of gait and mobility: Secondary | ICD-10-CM

## 2023-05-07 DIAGNOSIS — R278 Other lack of coordination: Secondary | ICD-10-CM

## 2023-05-12 ENCOUNTER — Ambulatory Visit: Payer: Medicare Other

## 2023-05-13 ENCOUNTER — Encounter (HOSPITAL_COMMUNITY): Payer: Self-pay | Admitting: Cardiology

## 2023-05-14 ENCOUNTER — Ambulatory Visit: Payer: Medicare Other | Admitting: Occupational Therapy

## 2023-05-14 ENCOUNTER — Ambulatory Visit: Payer: Medicare Other

## 2023-05-15 ENCOUNTER — Ambulatory Visit: Payer: Medicare Other

## 2023-05-19 ENCOUNTER — Ambulatory Visit: Payer: Medicare Other

## 2023-05-19 ENCOUNTER — Ambulatory Visit: Payer: Medicare Other | Admitting: Occupational Therapy

## 2023-05-19 NOTE — Therapy (Signed)
OUTPATIENT PHYSICAL THERAPY NEURO EVALUATION   Patient Name: Arvis Warmkessel MRN: [id number] DOB:Mar 30, 1971, 52 y.o., male Today's Date: 05/20/2023   PCP: Dale [place] MD REFERRING PROVIDER: Dale Callimont MD  END OF SESSION:  PT End of Session - 05/20/23 1057     Visit Number 1    Number of Visits 16    Date for PT Re-Evaluation 07/15/23    PT Start Time 1015    PT Stop Time 1059    PT Time Calculation (min) 44 min    Equipment Utilized During Treatment Gait belt    Activity Tolerance Patient tolerated treatment well    Behavior During Therapy WFL for tasks assessed/performed             Past Medical History:  Diagnosis Date   Allergy    Anemia    Bell's palsy    Diabetes mellitus without complication (HCC)    diet controlled   Hypertension    Hypothyroidism    Kidney stones    Pseudotumor cerebri    Stroke [facility])    Past Surgical History:  Procedure Laterality Date   COLONOSCOPY WITH PROPOFOL N/A 03/30/2020   Procedure: COLONOSCOPY WITH PROPOFOL;  Surgeon: Regis Bill, MD;  Location: [facility] ENDOSCOPY;  Service: Endoscopy;  Laterality: N/A;   LOOP RECORDER INSERTION N/A 01/27/2018   Procedure: LOOP RECORDER INSERTION;  Surgeon: Duke Salvia, MD;  Location: [facility] INVASIVE CV LAB;  Service: Cardiovascular;  Laterality: N/A;   LUMBAR PUNCTURE     as child   NO PAST SURGERIES     TEE WITHOUT CARDIOVERSION N/A 01/07/2018   Procedure: TRANSESOPHAGEAL ECHOCARDIOGRAM (TEE);  Surgeon: Antonieta Iba, MD;  Location: [facility] ORS;  Service: Cardiovascular;  Laterality: N/A;   Patient Active Problem List   Diagnosis Date Noted   Non-compliance with renal dialysis 05/02/2023   Obesity (BMI 30-39.9) 04/30/2023   Volume overload 04/29/2023   Unsteady gait 10/05/2022   Steal syndrome of dialysis vascular access (HCC) 05/28/2022   Hydronephrosis, left 05/04/2022   Leukocytosis 05/04/2022   Pre-op evaluation 04/24/2022   Chest pain 01/13/2022   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 (HCC) 01/13/2022   ESRD on hemodialysis (HCC) 01/13/2022   Deafness in right ear 08/11/2021   Open wound 01/25/2021   History of colon polyps 10/15/2020   Postoperative hemorrhage involving digestive system following digestive system procedure 09/19/2020   Acute cholecystitis without calculus 09/09/2020   Type 2 diabetes mellitus, with long-term current use of insulin (HCC) 09/09/2020   Cryptogenic stroke (HCC) 07/13/2020   History of loop recorder 07/13/2020   History of 2019 novel coronavirus disease (COVID-19) 05/20/2020   Pneumonia due to COVID-19 virus 04/03/2020   Elevated troponin 04/03/2020   Acquired trigger finger 06/08/2019   Lymphedema 06/08/2019   Anemia 04/17/2019   Swelling of both lower extremities 01/10/2019   Facial droop 04/09/2018   Daytime somnolence 03/30/2018   Carotid artery disease (HCC) 02/03/2018   Intracranial vascular stenosis 09/12/2017   Cough 01/20/2017   Bell's palsy 11/10/2016   History of CVA (cerebrovascular accident) 11/10/2016   Benign localized hyperplasia of prostate with urinary obstruction 10/27/2016   History of nephrolithiasis 10/27/2016   TIA (transient ischemic attack) 10/20/2016   Near syncope 06/23/2016   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   Neuropathy 08/06/2015   Health care maintenance 08/06/2015   Essential hypertension 08/06/2015   Heme positive stool 10/10/2013   Hypothyroidism 10/10/2013   Microalbuminuria 10/10/2013   Hyperlipidemia 10/10/2013  B12 deficiency 10/10/2013   Environmental allergies 07/04/2013    ONSET DATE: 3 years ago  REFERRING DIAG: CVA  THERAPY DIAG:  Muscle weakness (generalized)  Unsteadiness on feet  Abnormality of gait and mobility  Difficulty in walking, not elsewhere classified  Rationale for Evaluation and Treatment: Rehabilitation  SUBJECTIVE:                                                                                                                                                                                              SUBJECTIVE STATEMENT: Patient is returning to PT s/p hospitalization.  Pt accompanied by: self  PERTINENT HISTORY: Patient returning to PT after hospitalization after being off HD for past 3 months with end stage ESRD. PMH DM, HTN, Anemia, Bells palsy, Hypothyroidism, Kidney stones, stroke and pseudotumour cerebri. PAIN:  Are you having pain? No  PRECAUTIONS: Fall  RED FLAGS: None   WEIGHT BEARING RESTRICTIONS: No  FALLS: Has patient fallen in last 6 months? No  LIVING ENVIRONMENT: Lives with: lives alone Lives in: House/apartment Stairs:  flight of stairs with rail on R  Has following equipment at home: Single point cane  PLOF: Independent with basic ADLs  PATIENT GOALS: to get LLE stronger and more steady  OBJECTIVE:   DIAGNOSTIC FINDINGS: n/a  COGNITION: Overall cognitive status: Within functional limits for tasks assessed   SENSATION: Stocking loss of sensation bilateral feet    POSTURE: rounded shoulders and forward head    LOWER EXTREMITY MMT:    MMT Right Eval Left Eval  Hip flexion 4 4-  Hip extension    Hip abduction 4 4-  Hip adduction 4- 4-  Hip internal rotation    Hip external rotation    Knee flexion 3+ 3+  Knee extension 4- 3+  Ankle dorsiflexion 3+ 2+  Ankle plantarflexion 4 4-  Ankle inversion    Ankle eversion    (Blank rows = not tested)     TRANSFERS: Assistive device utilized: None  Sit to stand: CGA Stand to sit: CGA Chair to chair: CGA   STAIRS: Level of Assistance: CGA Stair Negotiation Technique: Alternating Pattern  with Bilateral Rails Number of Stairs: 4  Height of Stairs: 6  Comments: more challenging going down than up  GAIT: Gait pattern: step through pattern, decreased arm swing- Left, decreased stance time- Left, and poor foot clearance- Left Distance walked: 1060 Assistive device  utilized: None Level of assistance: CGA Comments: increased foot drag and eversion of L foot with prolonged ambulation   FUNCTIONAL TESTS:  5 times sit to stand: 20.23 seconds 10 meter walk test: 8 seconds Sharlene Motts  Balance Scale: 39 6 min walk test: 1060   PATIENT SURVEYS:  FOTO 61.5  TODAY'S TREATMENT:                                                                                                                              DATE: 05/20/23    Eval only  PATIENT EDUCATION: Education details: goals, POC,  Person educated: Patient Education method: Explanation, Demonstration, Tactile cues, and Verbal cues Education comprehension: verbalized understanding, returned demonstration, verbal cues required, and tactile cues required  HOME EXERCISE PROGRAM: Give next session   GOALS: Goals reviewed with patient? Yes  SHORT TERM GOALS: Target date: 06/17/2023    Patient will be independent in home exercise program to improve strength/mobility for better functional independence with ADLs. Baseline: Goal status: INITIAL    LONG TERM GOALS: Target date: 07/15/2023    Patient will increase FOTO score to equal to or greater than   72%  to demonstrate statistically significant improvement in mobility and quality of life.  Baseline: 9/24: 65% Goal status: INITIAL  2.   Patient (< 22 years old) will complete five times sit to stand test in < 10 seconds indicating an increased LE strength and improved balance. Baseline: 9/24: 20.23 seconds no hands, one near LOB Goal status: INITIAL  3.   Patient will increase Berg Balance score by >45/56 to demonstrate decreased fall risk during functional activities.  Baseline: 39 Goal status: INITIAL  4.    Patient will increase six minute walk test distance to >1400 for progression to age norm community ambulator and improve gait ability Baseline: 9/24: 1060  Goal status: INITIAL   ASSESSMENT:  CLINICAL IMPRESSION: Patient is returning to  therapy s/p hospitalization for ESRD after no HD for 3 months.  Patient is a 52 y.o. male who was seen today for physical therapy evaluation and treatment for CVA. He does have increased weakness of LLE noted throughout session today compared to pre-hospital strength. His balance has had some decline as well as his mobility indicating need for therapy to return to PLOF. Patient is agreeable with POC. Patient will benefit from skilled physical therapy to improve strength, stability and mobility.   OBJECTIVE IMPAIRMENTS: Abnormal gait, cardiopulmonary status limiting activity, decreased activity tolerance, decreased balance, decreased coordination, decreased mobility, difficulty walking, decreased strength, impaired flexibility, impaired sensation, impaired vision/preception, improper body mechanics, and postural dysfunction.   ACTIVITY LIMITATIONS: carrying, lifting, bending, sitting, standing, squatting, stairs, transfers, bed mobility, dressing, reach over head, hygiene/grooming, locomotion level, and caring for others  PARTICIPATION LIMITATIONS: meal prep, cleaning, laundry, personal finances, driving, shopping, community activity, occupation, and yard work  PERSONAL FACTORS: Age, Fitness, Past/current experiences, Time since onset of injury/illness/exacerbation, and 3+ comorbidities: DM, HTN, Anemia, Bells palsy, Hypothyroidism, Kidney stones, stroke and pseudotumour cerebri  are also affecting patient's functional outcome.   REHAB POTENTIAL: Good  CLINICAL DECISION MAKING: Evolving/moderate complexity  EVALUATION COMPLEXITY: Moderate  PLAN:  PT FREQUENCY: 2x/week  PT DURATION: 8 weeks  PLANNED INTERVENTIONS: Therapeutic exercises, Therapeutic activity, Neuromuscular re-education, Balance training, Gait training, Patient/Family education, Self Care, Joint mobilization, Stair training, Vestibular training, Canalith repositioning, Visual/preceptual remediation/compensation, Orthotic/Fit  training, DME instructions, Dry Needling, Spinal mobilization, Cryotherapy, Moist heat, Splintting, Taping, Traction, Manual therapy, and Re-evaluation  PLAN FOR NEXT SESSION: give HEP; hip flexor strength and ankle strength, balance    Precious Bard, PT 05/20/2023, 11:12 AM

## 2023-05-20 ENCOUNTER — Ambulatory Visit: Payer: Medicare Other | Attending: Internal Medicine

## 2023-05-20 ENCOUNTER — Ambulatory Visit: Payer: Medicare Other | Admitting: Occupational Therapy

## 2023-05-20 DIAGNOSIS — R269 Unspecified abnormalities of gait and mobility: Secondary | ICD-10-CM | POA: Insufficient documentation

## 2023-05-20 DIAGNOSIS — R2681 Unsteadiness on feet: Secondary | ICD-10-CM | POA: Insufficient documentation

## 2023-05-20 DIAGNOSIS — M6281 Muscle weakness (generalized): Secondary | ICD-10-CM

## 2023-05-20 DIAGNOSIS — R262 Difficulty in walking, not elsewhere classified: Secondary | ICD-10-CM | POA: Insufficient documentation

## 2023-05-20 DIAGNOSIS — R278 Other lack of coordination: Secondary | ICD-10-CM | POA: Insufficient documentation

## 2023-05-20 NOTE — Therapy (Signed)
OUTPATIENT OCCUPATIONAL THERAPY NEURO EVALUATION  Patient Name: Gregory Crane MRN: [id number] DOB:12/24/1970, 52 y.o., male Today's Date: 05/20/2023   REFERRING PROVIDER: Dale [place], MD  END OF SESSION:  OT End of Session - 05/20/23 1108     Visit Number 1    Number of Visits 24    Date for OT Re-Evaluation 08/12/23    OT Start Time 1105    OT Stop Time 1145    OT Time Calculation (min) 40 min    Activity Tolerance Patient tolerated treatment well    Behavior During Therapy [facility] for tasks assessed/performed             Past Medical History:  Diagnosis Date   Allergy    Anemia    Bell's palsy    Diabetes mellitus without complication (HCC)    diet controlled   Hypertension    Hypothyroidism    Kidney stones    Pseudotumor cerebri    Stroke [facility])    Past Surgical History:  Procedure Laterality Date   COLONOSCOPY WITH PROPOFOL N/A 03/30/2020   Procedure: COLONOSCOPY WITH PROPOFOL;  Surgeon: Regis Bill, MD;  Location: [facility] ENDOSCOPY;  Service: Endoscopy;  Laterality: N/A;   LOOP RECORDER INSERTION N/A 01/27/2018   Procedure: LOOP RECORDER INSERTION;  Surgeon: Duke Salvia, MD;  Location: [facility] INVASIVE CV LAB;  Service: Cardiovascular;  Laterality: N/A;   LUMBAR PUNCTURE     as child   NO PAST SURGERIES     TEE WITHOUT CARDIOVERSION N/A 01/07/2018   Procedure: TRANSESOPHAGEAL ECHOCARDIOGRAM (TEE);  Surgeon: Antonieta Iba, MD;  Location: [facility] ORS;  Service: Cardiovascular;  Laterality: N/A;   Patient Active Problem List   Diagnosis Date Noted   Non-compliance with renal dialysis 05/02/2023   Obesity (BMI 30-39.9) 04/30/2023   Volume overload 04/29/2023   Unsteady gait 10/05/2022   Steal syndrome of dialysis vascular access (HCC) 05/28/2022   Hydronephrosis, left 05/04/2022   Leukocytosis 05/04/2022   Pre-op evaluation 04/24/2022   Chest pain 01/13/2022   Diabetic retinopathy associated with diabetes mellitus due to underlying condition (HCC)  01/13/2022   ESRD on hemodialysis (HCC) 01/13/2022   Deafness in right ear 08/11/2021   Open wound 01/25/2021   History of colon polyps 10/15/2020   Postoperative hemorrhage involving digestive system following digestive system procedure 09/19/2020   Acute cholecystitis without calculus 09/09/2020   Type 2 diabetes mellitus, with long-term current use of insulin (HCC) 09/09/2020   Cryptogenic stroke (HCC) 07/13/2020   History of loop recorder 07/13/2020   History of 2019 novel coronavirus disease (COVID-19) 05/20/2020   Pneumonia due to COVID-19 virus 04/03/2020   Elevated troponin 04/03/2020   Acquired trigger finger 06/08/2019   Lymphedema 06/08/2019   Anemia 04/17/2019   Swelling of both lower extremities 01/10/2019   Facial droop 04/09/2018   Daytime somnolence 03/30/2018   Carotid artery disease (HCC) 02/03/2018   Intracranial vascular stenosis 09/12/2017   Cough 01/20/2017   Bell's palsy 11/10/2016   History of CVA (cerebrovascular accident) 11/10/2016   Benign localized hyperplasia of prostate with urinary obstruction 10/27/2016   History of nephrolithiasis 10/27/2016   TIA (transient ischemic attack) 10/20/2016   Near syncope 06/23/2016   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   Neuropathy 08/06/2015   Health care maintenance 08/06/2015   Essential hypertension 08/06/2015   Heme positive stool 10/10/2013   Hypothyroidism 10/10/2013   Microalbuminuria 10/10/2013   Hyperlipidemia 10/10/2013   B12 deficiency 10/10/2013   Environmental allergies 07/04/2013    ONSET  DATE: 04/28/2023  REFERRING DIAG: Swelling BLEs, ESRD, CVA  THERAPY DIAG:  Muscle weakness (generalized)  Other lack of coordination  Rationale for Evaluation and Treatment: Rehabilitation  SUBJECTIVE:   SUBJECTIVE STATEMENT: Pt. reports that his step daughter is now living with him. Pt accompanied by: self  PERTINENT HISTORY: Pt. was hospitalized from 9/02-9/12/2022 for LE edema. Pt. was previously being  followed by outpatient  OT services for weakness following a CVA. Pt. Restarted Hemodialysis. PMHx includes: ESRD on Hemodialysis, CVA, Hypothyroidism, Anemia, Type II DM, Volume overload, obesity, and noncompliant renal dialysis.  PRECAUTIONS: None  WEIGHT BEARING RESTRICTIONS: No  PAIN:  Are you having pain? 0/10  FALLS: Has patient fallen in last 6 months? No  LIVING ENVIRONMENT: Lives with:  Step daughter resides with the Pt.  Lives in: House/apartment Stairs:No external steps, Yes: Internal: 15 steps; on right going up Has following equipment at home: Walker - 2 wheeled  PLOF: Independent  PATIENT GOALS: To improve bilateral FMC primarily the left  OBJECTIVE:   HAND DOMINANCE: Right  ADLs: Transfers/ambulation related to ADLs: Eating: Independent, inefficient with cutting food Grooming: Independent UB Dressing: Independent, buttoning is challenging LB Dressing: Independent, difficulty with tying shoes Toileting: Independent Bathing: Independent Tub Shower transfers: Independent Equipment: none  IADLs: Shopping: Independent; difficulty with cash interaction Light housekeeping: Independent, slower than usual. Meal Prep: Independent, Requires increased time Community mobility: Drives Medication management: Difficulty setting handling small pills. Financial management: No changes Handwriting: 90% legible  MOBILITY STATUS: Independent  ACTIVITY TOLERANCE: Activity tolerance: Fatigues after 10-20 min.  FUNCTIONAL OUTCOME MEASURES: FOTO: TBD 2/2 set-up change required  UPPER EXTREMITY ROM:    Active ROM Right Eval WFL Left Eval [facility]  Shoulder flexion    Shoulder abduction    Shoulder adduction    Shoulder extension    Shoulder internal rotation    Shoulder external rotation    Elbow flexion    Elbow extension    Wrist flexion    Wrist extension    Wrist ulnar deviation    Wrist radial deviation    Wrist pronation    Wrist supination    (Blank  rows = not tested)  UPPER EXTREMITY MMT:     MMT Right eval Left eval  Shoulder flexion 4/5 4/5  Shoulder abduction 4/5 4/5  Shoulder adduction    Shoulder extension    Shoulder internal rotation    Shoulder external rotation    Middle trapezius    Lower trapezius    Elbow flexion 4/5 4/5  Elbow extension 4/5 4/5  Wrist flexion 4/5 4/5  Wrist extension 4/5 4/5  Wrist ulnar deviation    Wrist radial deviation    Wrist pronation    Wrist supination    (Blank rows = not tested)  HAND FUNCTION: Grip strength: Right: 40 lbs; Left: 40 lbs, Lateral pinch: Right: 9 lbs, Left: 8 lbs, and 3 point pinch: Right: 6 lbs, Left: 5 lbs  COORDINATION:  9 Hole Peg test: Right: 32 sec; Left: 1 min & 8 sec. for *8* pegs only with multiple dropped pegs.  SENSATION:  Positive numbness increased in the Left 4th &5th digits, with mild numbness in all digits of the left hand  EDEMA: left   MUSCLE TONE: RUE: Hypotonic  COGNITION: Overall cognitive status: Within functional limits for tasks assessed  VISION: Subjective report: light sensitivity, Retina specialist is following Pt.  Baseline vision: Wears glasses for reading only Visual history: macular degeneration Per Pt. Report: affects from years of  Diabetes.  VISION ASSESSMENT: Continue to assess in functional context  PERCEPTION: WFL  PRAXIS: impaired  TODAY'S TREATMENT:                                                                                                                              DATE: 05/20/2023   PATIENT EDUCATION: Education details: OT POC, goals, measurements Person educated: Patient Education method: Medical illustrator Education comprehension: verbalized understanding and returned demonstration  HOME EXERCISE PROGRAM: Continue to assess, and provide as needed.    GOALS: Goals reviewed with patient? Yes  SHORT TERM GOALS: Target date:  07/01/2023      Pt will demonstrate ability to hold and  utilize utensils for cutting meat with modified independence. Baseline:Eval: Pt. Has difficulty with cutting, stabilizing item with fork for cutting Goal status: INITIAL    LONG TERM GOALS: Target date:  08/12/2023  Pt will demonstrate HEP with modified independence.   Baseline: Eval: HEPs to be modified, and upgraded as needed since recent hospitalization. Goal status: INITIAL   2.  Pt will improve left UE coordination by 10 secs on 9 hole peg test to demonstrate shoe tying with modified independence.  Baseline: R: 32 sec., L: 1 min. & 8 sec. for *8 pegs only.Pt. hand difficulty with efficiently tying shoes. Goal status:  INITIAL   3.  Pt will demonstrate improved coordination in left hand to demonstrate buttoning buttons on clothing with modified independence.  Baseline: Pt. Has difficulty manipulating buttons. Goal status: INITIAL   4.  Pt will demonstrate improvement in bilat grip strength by 3# to open jars and containers with modified independence and greater ease. Baseline: Eval: R: 40#, L: 40# Pt. Has difficulty with opening jars and containers with use of left hand. Goal status: INITIAL  5.  Pt will improve left UE strength by 1 mm grade to hold and hand wash dishes with modified independence.  Baseline: BUE strength 4/5 Goal status: INITIAL   6.  Pt will Improve FOTO score by 2 points or greater to show a clinically relevant change in LUE to impact greater independence with daily ADL and IADL tasks at home and in the community.   Baseline: Eval: TBD 2/2 set-up change required Goal status: INITIAL  ASSESSMENT:  CLINICAL IMPRESSION: Patient is a 52 y.o. male who was seen today for occupational therapy evaluation for CVA. Prior to this most recent hospitalization. Pt. was actively being seen for Outpatient OT services following a CVA. Pt. presents with decreased BUE strength, and left hand [facility] skills which limit his ability to complete daily ADL tasks, manipulate small objects,  buttons, and laces. Pt. also presents with difficulty opening containers, and cutting meat. Pt. will benefit from OT services to work on improving BUE strength, hand function, and left hand [facility] skills in order to maximize independence with ADLs, and IADL tasks.   PERFORMANCE DEFICITS: in functional skills including ADLs, IADLs, coordination, dexterity, sensation, ROM, strength, Fine  motor control, Gross motor control, balance, body mechanics, vision, and UE functional use.   IMPAIRMENTS: are limiting patient from ADLs, IADLs, leisure, and social participation.   CO-MORBIDITIES: may have co-morbidities  that affects occupational performance. Patient will benefit from skilled OT to address above impairments and improve overall function.  MODIFICATION OR ASSISTANCE TO COMPLETE EVALUATION: Min-Moderate modification of tasks or assist with assess necessary to complete an evaluation.  OT OCCUPATIONAL PROFILE AND HISTORY: Detailed assessment: Review of records and additional review of physical, cognitive, psychosocial history related to current functional performance.  CLINICAL DECISION MAKING: Moderate - several treatment options, min-mod task modification necessary  REHAB POTENTIAL: Good  EVALUATION COMPLEXITY: Moderate    PLAN:  OT FREQUENCY: 2x/week  OT DURATION: 12 weeks  PLANNED INTERVENTIONS: self care/ADL training, therapeutic exercise, therapeutic activity, neuromuscular re-education, manual therapy, moist heat, patient/family education, and DME and/or AE instructions  RECOMMENDED OTHER SERVICES: PT  CONSULTED AND AGREED WITH PLAN OF CARE: Patient  PLAN FOR NEXT SESSION:   Initiate treatment  Olegario Messier, MS, OTR/L 05/20/2023, 4:28

## 2023-05-21 ENCOUNTER — Ambulatory Visit: Payer: Medicare Other

## 2023-05-21 ENCOUNTER — Ambulatory Visit: Payer: Medicare Other | Admitting: Occupational Therapy

## 2023-05-22 ENCOUNTER — Ambulatory Visit: Payer: Medicare Other

## 2023-05-22 DIAGNOSIS — R2681 Unsteadiness on feet: Secondary | ICD-10-CM

## 2023-05-22 DIAGNOSIS — M6281 Muscle weakness (generalized): Secondary | ICD-10-CM

## 2023-05-22 DIAGNOSIS — R269 Unspecified abnormalities of gait and mobility: Secondary | ICD-10-CM

## 2023-05-22 DIAGNOSIS — R278 Other lack of coordination: Secondary | ICD-10-CM

## 2023-05-22 DIAGNOSIS — R262 Difficulty in walking, not elsewhere classified: Secondary | ICD-10-CM

## 2023-05-22 NOTE — Therapy (Signed)
OUTPATIENT PHYSICAL THERAPY TREATMENT   Patient Name: Gregory Crane MRN: [id number] DOB:01/07/1971, 52 y.o., male Today's Date: 05/22/2023   PCP: Dale [place] MD REFERRING PROVIDER: Dale [place] MD  END OF SESSION:  PT End of Session - 05/22/23 1019     Visit Number 2    Number of Visits 16    Date for PT Re-Evaluation 07/15/23    Authorization Type Medicare; Medicaid    Authorization Time Period through 07/15/23    PT Start Time 1015    PT Stop Time 1055    PT Time Calculation (min) 40 min    Equipment Utilized During Treatment Gait belt    Activity Tolerance Patient tolerated treatment well    Behavior During Therapy WFL for tasks assessed/performed             Past Medical History:  Diagnosis Date   Allergy    Anemia    Bell's palsy    Diabetes mellitus without complication (HCC)    diet controlled   Hypertension    Hypothyroidism    Kidney stones    Pseudotumor cerebri    Stroke [facility])    Past Surgical History:  Procedure Laterality Date   COLONOSCOPY WITH PROPOFOL N/A 03/30/2020   Procedure: COLONOSCOPY WITH PROPOFOL;  Surgeon: Regis Bill, MD;  Location: [facility] ENDOSCOPY;  Service: Endoscopy;  Laterality: N/A;   LOOP RECORDER INSERTION N/A 01/27/2018   Procedure: LOOP RECORDER INSERTION;  Surgeon: Duke Salvia, MD;  Location: [facility] INVASIVE CV LAB;  Service: Cardiovascular;  Laterality: N/A;   LUMBAR PUNCTURE     as child   NO PAST SURGERIES     TEE WITHOUT CARDIOVERSION N/A 01/07/2018   Procedure: TRANSESOPHAGEAL ECHOCARDIOGRAM (TEE);  Surgeon: Antonieta Iba, MD;  Location: [facility] ORS;  Service: Cardiovascular;  Laterality: N/A;   Patient Active Problem List   Diagnosis Date Noted   Non-compliance with renal dialysis 05/02/2023   Obesity (BMI 30-39.9) 04/30/2023   Volume overload 04/29/2023   Unsteady gait 10/05/2022   Steal syndrome of dialysis vascular access (HCC) 05/28/2022   Hydronephrosis, left 05/04/2022   Leukocytosis  05/04/2022   Pre-op evaluation 04/24/2022   Chest pain 01/13/2022   Diabetic retinopathy associated with diabetes mellitus due to underlying condition (HCC) 01/13/2022   ESRD on hemodialysis (HCC) 01/13/2022   Deafness in right ear 08/11/2021   Open wound 01/25/2021   History of colon polyps 10/15/2020   Postoperative hemorrhage involving digestive system following digestive system procedure 09/19/2020   Acute cholecystitis without calculus 09/09/2020   Type 2 diabetes mellitus, with long-term current use of insulin (HCC) 09/09/2020   Cryptogenic stroke (HCC) 07/13/2020   History of loop recorder 07/13/2020   History of 2019 novel coronavirus disease (COVID-19) 05/20/2020   Pneumonia due to COVID-19 virus 04/03/2020   Elevated troponin 04/03/2020   Acquired trigger finger 06/08/2019   Lymphedema 06/08/2019   Anemia 04/17/2019   Swelling of both lower extremities 01/10/2019   Facial droop 04/09/2018   Daytime somnolence 03/30/2018   Carotid artery disease (HCC) 02/03/2018   Intracranial vascular stenosis 09/12/2017   Cough 01/20/2017   Bell's palsy 11/10/2016   History of CVA (cerebrovascular accident) 11/10/2016   Benign localized hyperplasia of prostate with urinary obstruction 10/27/2016   History of nephrolithiasis 10/27/2016   TIA (transient ischemic attack) 10/20/2016   Near syncope 06/23/2016   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   Neuropathy 08/06/2015   Health care maintenance 08/06/2015   Essential hypertension 08/06/2015   Heme positive  stool 10/10/2013   Hypothyroidism 10/10/2013   Microalbuminuria 10/10/2013   Hyperlipidemia 10/10/2013   B12 deficiency 10/10/2013   Environmental allergies 07/04/2013    ONSET DATE: 3 years ago  REFERRING DIAG: CVA  THERAPY DIAG:  Muscle weakness (generalized)  Other lack of coordination  Unsteadiness on feet  Abnormality of gait and mobility  Difficulty in walking, not elsewhere classified  Rationale for Evaluation and  Treatment: Rehabilitation  SUBJECTIVE:                                                                                                                                                                                             SUBJECTIVE STATEMENT: Pt denies any updates since eval. Doing well.   Pt accompanied by: self  PERTINENT HISTORY: Patient returning to PT after hospitalization after being off HD for past 3 months with end stage ESRD. PMH DM, HTN, Anemia, Bells palsy, Hypothyroidism, Kidney stones, stroke and pseudotumour cerebri. PAIN:  Are you having pain? No  PRECAUTIONS: Fall  RED FLAGS: None   WEIGHT BEARING RESTRICTIONS: No  FALLS: Has patient fallen in last 6 months? No  PATIENT GOALS: to get LLE stronger and more steady  OBJECTIVE:   TODAY'S TREATMENT:                                                                                                                              DATE: 05/22/23   -STS from chair, hands free 1x8 (4-5 LOB)  -AMB over lower level x94ft (6 minutes) -STS from elevated plinth 21.5" hands free (cues to reach forward) 1x10 -AMB over lower level x957ft (6 minutes) -STS from elevated plinth 21.5" hands free (cues to reach forward) 1x10  -step taps, alteranting sides x30 -eyes closed balance 2x30sec -360 degree turns 6x alternating directions -SLS practice high/slow marching in place -trunk rotation with physioball tapping wall behind him x24, alternating   PATIENT EDUCATION: Education details: goals, POC,  Person educated: Patient Education method: Explanation, Demonstration, Tactile cues, and Verbal cues Education comprehension: verbalized understanding, returned demonstration, verbal cues required, and tactile cues  required  HOME EXERCISE PROGRAM: Give next session   GOALS: Goals reviewed with patient? Yes  SHORT TERM GOALS: Target date: 06/17/2023   Patient will be independent in home exercise program to improve  strength/mobility for better functional independence with ADLs. Baseline: Goal status: INITIAL   LONG TERM GOALS: Target date: 07/15/2023   Patient will increase FOTO score to equal to or greater than   72%  to demonstrate statistically significant improvement in mobility and quality of life.  Baseline: 9/24: 65% Goal status: INITIAL  2.   Patient (< 68 years old) will complete five times sit to stand test in < 10 seconds indicating an increased LE strength and improved balance. Baseline: 9/24: 20.23 seconds no hands, one near LOB Goal status: INITIAL  3.   Patient will increase Berg Balance score by >45/56 to demonstrate decreased fall risk during functional activities.  Baseline: 39 Goal status: INITIAL  4.    Patient will increase six minute walk test distance to >1400 for progression to age norm community ambulator and improve gait ability Baseline: 9/24: 1060  Goal status: INITIAL   ASSESSMENT:  CLINICAL IMPRESSION: Working on strength, AMB tolerance, and balance on a rainy Thursday. Pt does well, verbalizes his fatigue limitations, follows cues for improved motor control. ASke dot start a walking program. Patient will benefit from skilled physical therapy to improve strength, stability and mobility.   OBJECTIVE IMPAIRMENTS: Abnormal gait, cardiopulmonary status limiting activity, decreased activity tolerance, decreased balance, decreased coordination, decreased mobility, difficulty walking, decreased strength, impaired flexibility, impaired sensation, impaired vision/preception, improper body mechanics, and postural dysfunction.   ACTIVITY LIMITATIONS: carrying, lifting, bending, sitting, standing, squatting, stairs, transfers, bed mobility, dressing, reach over head, hygiene/grooming, locomotion level, and caring for others  PARTICIPATION LIMITATIONS: meal prep, cleaning, laundry, personal finances, driving, shopping, community activity, occupation, and yard work  PERSONAL  FACTORS: Age, Fitness, Past/current experiences, Time since onset of injury/illness/exacerbation, and 3+ comorbidities: DM, HTN, Anemia, Bells palsy, Hypothyroidism, Kidney stones, stroke and pseudotumour cerebri  are also affecting patient's functional outcome.   REHAB POTENTIAL: Good  CLINICAL DECISION MAKING: Evolving/moderate complexity  EVALUATION COMPLEXITY: Moderate  PLAN:  PT FREQUENCY: 2x/week  PT DURATION: 8 weeks  PLANNED INTERVENTIONS: Therapeutic exercises, Therapeutic activity, Neuromuscular re-education, Balance training, Gait training, Patient/Family education, Self Care, Joint mobilization, Stair training, Vestibular training, Canalith repositioning, Visual/preceptual remediation/compensation, Orthotic/Fit training, DME instructions, Dry Needling, Spinal mobilization, Cryotherapy, Moist heat, Splintting, Taping, Traction, Manual therapy, and Re-evaluation  PLAN FOR NEXT SESSION: give HEP; hip flexor strength and ankle strength, balance    Marshay Slates C, PT 05/22/2023, 10:23 AM  10:58 AM, 05/22/23 Rosamaria Lints, PT, DPT Physical Therapist - [facility]  Outpatient Physical Therapy- Main Campus [phone]

## 2023-05-22 NOTE — Therapy (Signed)
OUTPATIENT OCCUPATIONAL THERAPY NEURO TREATMENT NOTE  Patient Name: Gregory Crane MRN: [id number] DOB:12-10-1970, 52 y.o., male Today's Date: 05/23/2023   REFERRING PROVIDER: Dale Ensenada, MD  END OF SESSION:  OT End of Session - 05/22/23 1106     Visit Number 2    Number of Visits 24    Date for OT Re-Evaluation 08/12/23    Progress Note Due on Visit 10    OT Start Time 1100    OT Stop Time 1145    OT Time Calculation (min) 45 min    Activity Tolerance Patient tolerated treatment well    Behavior During Therapy [facility] for tasks assessed/performed             Past Medical History:  Diagnosis Date   Allergy    Anemia    Bell's palsy    Diabetes mellitus without complication (HCC)    diet controlled   Hypertension    Hypothyroidism    Kidney stones    Pseudotumor cerebri    Stroke [facility])    Past Surgical History:  Procedure Laterality Date   COLONOSCOPY WITH PROPOFOL N/A 03/30/2020   Procedure: COLONOSCOPY WITH PROPOFOL;  Surgeon: Regis Bill, MD;  Location: [facility] ENDOSCOPY;  Service: Endoscopy;  Laterality: N/A;   LOOP RECORDER INSERTION N/A 01/27/2018   Procedure: LOOP RECORDER INSERTION;  Surgeon: Duke Salvia, MD;  Location: [facility] INVASIVE CV LAB;  Service: Cardiovascular;  Laterality: N/A;   LUMBAR PUNCTURE     as child   NO PAST SURGERIES     TEE WITHOUT CARDIOVERSION N/A 01/07/2018   Procedure: TRANSESOPHAGEAL ECHOCARDIOGRAM (TEE);  Surgeon: Antonieta Iba, MD;  Location: [facility] ORS;  Service: Cardiovascular;  Laterality: N/A;   Patient Active Problem List   Diagnosis Date Noted   Non-compliance with renal dialysis 05/02/2023   Obesity (BMI 30-39.9) 04/30/2023   Volume overload 04/29/2023   Unsteady gait 10/05/2022   Steal syndrome of dialysis vascular access (HCC) 05/28/2022   Hydronephrosis, left 05/04/2022   Leukocytosis 05/04/2022   Pre-op evaluation 04/24/2022   Chest pain 01/13/2022   Diabetic retinopathy associated with diabetes  mellitus due to underlying condition (HCC) 01/13/2022   ESRD on hemodialysis (HCC) 01/13/2022   Deafness in right ear 08/11/2021   Open wound 01/25/2021   History of colon polyps 10/15/2020   Postoperative hemorrhage involving digestive system following digestive system procedure 09/19/2020   Acute cholecystitis without calculus 09/09/2020   Type 2 diabetes mellitus, with long-term current use of insulin (HCC) 09/09/2020   Cryptogenic stroke (HCC) 07/13/2020   History of loop recorder 07/13/2020   History of 2019 novel coronavirus disease (COVID-19) 05/20/2020   Pneumonia due to COVID-19 virus 04/03/2020   Elevated troponin 04/03/2020   Acquired trigger finger 06/08/2019   Lymphedema 06/08/2019   Anemia 04/17/2019   Swelling of both lower extremities 01/10/2019   Facial droop 04/09/2018   Daytime somnolence 03/30/2018   Carotid artery disease (HCC) 02/03/2018   Intracranial vascular stenosis 09/12/2017   Cough 01/20/2017   Bell's palsy 11/10/2016   History of CVA (cerebrovascular accident) 11/10/2016   Benign localized hyperplasia of prostate with urinary obstruction 10/27/2016   History of nephrolithiasis 10/27/2016   TIA (transient ischemic attack) 10/20/2016   Near syncope 06/23/2016   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   Neuropathy 08/06/2015   Health care maintenance 08/06/2015   Essential hypertension 08/06/2015   Heme positive stool 10/10/2013   Hypothyroidism 10/10/2013   Microalbuminuria 10/10/2013   Hyperlipidemia 10/10/2013   B12 deficiency  10/10/2013   Environmental allergies 07/04/2013    ONSET DATE: 04/28/2023  REFERRING DIAG: Swelling BLEs, ESRD, CVA  THERAPY DIAG:  Muscle weakness (generalized)  Other lack of coordination  Rationale for Evaluation and Treatment: Rehabilitation  SUBJECTIVE:   SUBJECTIVE STATEMENT: Pt reports doing well today.  Pt states he hasn't seen much change in his coordination, but wants to continue to focus on bilat hand  strength. Pt accompanied by: self  PERTINENT HISTORY: Pt. was hospitalized from 9/02-9/12/2022 for LE edema. Pt. was previously being followed by outpatient  OT services for weakness following a CVA. Pt. Restarted Hemodialysis. PMHx includes: ESRD on Hemodialysis, CVA, Hypothyroidism, Anemia, Type II DM, Volume overload, obesity, and noncompliant renal dialysis.  PRECAUTIONS: None  WEIGHT BEARING RESTRICTIONS: No  PAIN:  Are you having pain? 0/10  FALLS: Has patient fallen in last 6 months? No  LIVING ENVIRONMENT: Lives with:  Step daughter resides with the Pt.  Lives in: House/apartment Stairs:No external steps, Yes: Internal: 15 steps; on right going up Has following equipment at home: Walker - 2 wheeled  PLOF: Independent  PATIENT GOALS: To improve bilateral FMC primarily the left  OBJECTIVE:   HAND DOMINANCE: Right  ADLs: Transfers/ambulation related to ADLs: Eating: Independent, inefficient with cutting food Grooming: Independent UB Dressing: Independent, buttoning is challenging LB Dressing: Independent, difficulty with tying shoes Toileting: Independent Bathing: Independent Tub Shower transfers: Independent Equipment: none  IADLs: Shopping: Independent; difficulty with cash interaction Light housekeeping: Independent, slower than usual. Meal Prep: Independent, Requires increased time Community mobility: Drives Medication management: Difficulty setting handling small pills. Financial management: No changes Handwriting: 90% legible  MOBILITY STATUS: Independent  ACTIVITY TOLERANCE: Activity tolerance: Fatigues after 10-20 min.  FUNCTIONAL OUTCOME MEASURES: FOTO: TBD 2/2 set-up change required 05/22/23: FOTO: 65; predicted 65  UPPER EXTREMITY ROM:    Active ROM Right Eval WFL Left Eval [facility]  Shoulder flexion    Shoulder abduction    Shoulder adduction    Shoulder extension    Shoulder internal rotation    Shoulder external rotation    Elbow  flexion    Elbow extension    Wrist flexion    Wrist extension    Wrist ulnar deviation    Wrist radial deviation    Wrist pronation    Wrist supination    (Blank rows = not tested)  UPPER EXTREMITY MMT:     MMT Right eval Left eval  Shoulder flexion 4/5 4/5  Shoulder abduction 4/5 4/5  Shoulder adduction    Shoulder extension    Shoulder internal rotation    Shoulder external rotation    Middle trapezius    Lower trapezius    Elbow flexion 4/5 4/5  Elbow extension 4/5 4/5  Wrist flexion 4/5 4/5  Wrist extension 4/5 4/5  Wrist ulnar deviation    Wrist radial deviation    Wrist pronation    Wrist supination    (Blank rows = not tested)  HAND FUNCTION: Grip strength: Right: 40 lbs; Left: 40 lbs, Lateral pinch: Right: 9 lbs, Left: 8 lbs, and 3 point pinch: Right: 6 lbs, Left: 5 lbs  COORDINATION:  9 Hole Peg test: Right: 32 sec; Left: 1 min & 8 sec. for *8* pegs only with multiple dropped pegs.  SENSATION:  Positive numbness increased in the Left 4th &5th digits, with mild numbness in all digits of the left hand  EDEMA: left   MUSCLE TONE: RUE: Hypotonic  COGNITION: Overall cognitive status: Within functional limits for tasks assessed  VISION: Subjective report: light sensitivity, Retina specialist is following Pt.  Baseline vision: Wears glasses for reading only Visual history: macular degeneration Per Pt. Report: affects from years of Diabetes.  VISION ASSESSMENT: Continue to assess in functional context  PERCEPTION: WFL  PRAXIS: impaired  TODAY'S TREATMENT:                                                                                                                              DATE: 05/22/2023  Therapeutic Exercise: -Facilitated bilat hand strengthening with use of hand gripper set at 23.4# for 1 trial on the R, progressing to 28.9# for 2 more trials, and 23.4# for all 3 trials on the L to remove jumbo pegs from pegboard.  Alternated hands with each  trial, and provided min vc to ensure L SF was engaged with each gripping rep. -Facilitated pinch strengthening with use of therapy resistant clothespins to target lateral and 3 point pinch of R/L hands.  Tolerated all colors but black for lateral pinch on each hand, yellow and red pins only for L 3 point pinch, and yellow, red, and green for R 3 point pinch.  Min vc and demo for maintaining pinch patterns.  PATIENT EDUCATION: Education details: Bilat hand strengthening exercises Person educated: Patient Education method: Medical illustrator Education comprehension: verbalized understanding and returned demonstration  HOME EXERCISE PROGRAM: Continue to assess, and provide as needed.   GOALS: Goals reviewed with patient? Yes  SHORT TERM GOALS: Target date:  07/01/2023      Pt will demonstrate ability to hold and utilize utensils for cutting meat with modified independence. Baseline:Eval: Pt. Has difficulty with cutting, stabilizing item with fork for cutting Goal status: INITIAL    LONG TERM GOALS: Target date:  08/12/2023  Pt will demonstrate HEP with modified independence.   Baseline: Eval: HEPs to be modified, and upgraded as needed since recent hospitalization. Goal status: INITIAL   2.  Pt will improve left UE coordination by 10 secs on 9 hole peg test to demonstrate shoe tying with modified independence.  Baseline: R: 32 sec., L: 1 min. & 8 sec. for *8 pegs only.Pt. hand difficulty with efficiently tying shoes. Goal status:  INITIAL   3.  Pt will demonstrate improved coordination in left hand to demonstrate buttoning buttons on clothing with modified independence.  Baseline: Pt. Has difficulty manipulating buttons. Goal status: INITIAL   4.  Pt will demonstrate improvement in bilat grip strength by 3# to open jars and containers with modified independence and greater ease. Baseline: Eval: R: 40#, L: 40# Pt. Has difficulty with opening jars and containers with use of  left hand. Goal status: INITIAL  5.  Pt will improve left UE strength by 1 mm grade to hold and hand wash dishes with modified independence.  Baseline: BUE strength 4/5 Goal status: INITIAL   6.  Pt will Improve FOTO score by 2 points or greater to show a clinically relevant change  in LUE to impact greater independence with daily ADL and IADL tasks at home and in the community.   Baseline: Eval: TBD 2/2 set-up change required; 05/22/23: 65 Goal status: INITIAL  ASSESSMENT:  CLINICAL IMPRESSION: Good tolerance to bilat hand strengthening exercises, providing short rest breaks by alternating hands between trials/sets.  Pt continues to present with bilat hand weakness, L more than R.  Pt tolerates up to 28.9# on the R hand when using the hand gripper to remove pegs, and 23.4# on the L.  Pt tolerates all color clips but black when using a lateral pinch, but limited to yellow and red clips on the L hand when working with a 3 point pinch pattern, and yellow, red, and green on the R hand.  Weakness in bilat hands continues to contribute to difficulty opening containers and cutting meat.  Pt. will continue to benefit from OT services to work on improving BUE strength, hand function, and left hand [facility] skills in order to maximize independence with ADLs, and IADL tasks.   PERFORMANCE DEFICITS: in functional skills including ADLs, IADLs, coordination, dexterity, sensation, ROM, strength, Fine motor control, Gross motor control, balance, body mechanics, vision, and UE functional use.   IMPAIRMENTS: are limiting patient from ADLs, IADLs, leisure, and social participation.   CO-MORBIDITIES: may have co-morbidities  that affects occupational performance. Patient will benefit from skilled OT to address above impairments and improve overall function.  MODIFICATION OR ASSISTANCE TO COMPLETE EVALUATION: Min-Moderate modification of tasks or assist with assess necessary to complete an evaluation.  OT OCCUPATIONAL  PROFILE AND HISTORY: Detailed assessment: Review of records and additional review of physical, cognitive, psychosocial history related to current functional performance.  CLINICAL DECISION MAKING: Moderate - several treatment options, min-mod task modification necessary  REHAB POTENTIAL: Good  EVALUATION COMPLEXITY: Moderate    PLAN:  OT FREQUENCY: 2x/week  OT DURATION: 12 weeks  PLANNED INTERVENTIONS: self care/ADL training, therapeutic exercise, therapeutic activity, neuromuscular re-education, manual therapy, moist heat, patient/family education, and DME and/or AE instructions  RECOMMENDED OTHER SERVICES: PT  CONSULTED AND AGREED WITH PLAN OF CARE: Patient  PLAN FOR NEXT SESSION: see above  Danelle Earthly, MS, OTR/L  05/23/2023, 4:28

## 2023-05-26 ENCOUNTER — Ambulatory Visit: Payer: Medicare Other | Admitting: Occupational Therapy

## 2023-05-26 ENCOUNTER — Ambulatory Visit: Payer: Medicare Other

## 2023-05-27 ENCOUNTER — Telehealth (HOSPITAL_COMMUNITY): Payer: Self-pay | Admitting: Cardiology

## 2023-05-27 ENCOUNTER — Ambulatory Visit: Payer: Medicare Other | Attending: Internal Medicine | Admitting: Occupational Therapy

## 2023-05-27 DIAGNOSIS — R262 Difficulty in walking, not elsewhere classified: Secondary | ICD-10-CM | POA: Diagnosis present

## 2023-05-27 DIAGNOSIS — R2689 Other abnormalities of gait and mobility: Secondary | ICD-10-CM | POA: Insufficient documentation

## 2023-05-27 DIAGNOSIS — R278 Other lack of coordination: Secondary | ICD-10-CM | POA: Diagnosis present

## 2023-05-27 DIAGNOSIS — R269 Unspecified abnormalities of gait and mobility: Secondary | ICD-10-CM | POA: Insufficient documentation

## 2023-05-27 DIAGNOSIS — R2681 Unsteadiness on feet: Secondary | ICD-10-CM | POA: Insufficient documentation

## 2023-05-27 DIAGNOSIS — M6281 Muscle weakness (generalized): Secondary | ICD-10-CM | POA: Diagnosis present

## 2023-05-27 NOTE — Therapy (Addendum)
OUTPATIENT OCCUPATIONAL THERAPY NEURO TREATMENT NOTE  Patient Name: Gregory Crane MRN: [id number] DOB:Jan 30, 1971, 52 y.o., male Today's Date: 05/27/2023   REFERRING PROVIDER: Dale Stanton, MD  END OF SESSION:  OT End of Session - 05/27/23 1112     Visit Number 3    Number of Visits 24    Date for OT Re-Evaluation 08/12/23    Progress Note Due on Visit 10    OT Start Time 1105    OT Stop Time 1145    OT Time Calculation (min) 40 min    Activity Tolerance Patient tolerated treatment well    Behavior During Therapy [facility] for tasks assessed/performed             Past Medical History:  Diagnosis Date   Allergy    Anemia    Bell's palsy    Diabetes mellitus without complication (HCC)    diet controlled   Hypertension    Hypothyroidism    Kidney stones    Pseudotumor cerebri    Stroke [facility])    Past Surgical History:  Procedure Laterality Date   COLONOSCOPY WITH PROPOFOL N/A 03/30/2020   Procedure: COLONOSCOPY WITH PROPOFOL;  Surgeon: Regis Bill, MD;  Location: [facility] ENDOSCOPY;  Service: Endoscopy;  Laterality: N/A;   LOOP RECORDER INSERTION N/A 01/27/2018   Procedure: LOOP RECORDER INSERTION;  Surgeon: Duke Salvia, MD;  Location: [facility] INVASIVE CV LAB;  Service: Cardiovascular;  Laterality: N/A;   LUMBAR PUNCTURE     as child   NO PAST SURGERIES     TEE WITHOUT CARDIOVERSION N/A 01/07/2018   Procedure: TRANSESOPHAGEAL ECHOCARDIOGRAM (TEE);  Surgeon: Antonieta Iba, MD;  Location: [facility] ORS;  Service: Cardiovascular;  Laterality: N/A;   Patient Active Problem List   Diagnosis Date Noted   Non-compliance with renal dialysis (HCC) 05/02/2023   Obesity (BMI 30-39.9) 04/30/2023   Volume overload 04/29/2023   Unsteady gait 10/05/2022   Steal syndrome of dialysis vascular access (HCC) 05/28/2022   Hydronephrosis, left 05/04/2022   Leukocytosis 05/04/2022   Pre-op evaluation 04/24/2022   Chest pain 01/13/2022   Diabetic retinopathy associated with diabetes  mellitus due to underlying condition (HCC) 01/13/2022   ESRD on hemodialysis (HCC) 01/13/2022   Deafness in right ear 08/11/2021   Open wound 01/25/2021   History of colon polyps 10/15/2020   Postoperative hemorrhage involving digestive system following digestive system procedure 09/19/2020   Acute cholecystitis without calculus 09/09/2020   Type 2 diabetes mellitus, with long-term current use of insulin (HCC) 09/09/2020   Cryptogenic stroke (HCC) 07/13/2020   History of loop recorder 07/13/2020   History of 2019 novel coronavirus disease (COVID-19) 05/20/2020   Pneumonia due to COVID-19 virus 04/03/2020   Elevated troponin 04/03/2020   Acquired trigger finger 06/08/2019   Lymphedema 06/08/2019   Anemia 04/17/2019   Swelling of both lower extremities 01/10/2019   Facial droop 04/09/2018   Daytime somnolence 03/30/2018   Carotid artery disease (HCC) 02/03/2018   Intracranial vascular stenosis 09/12/2017   Cough 01/20/2017   Bell's palsy 11/10/2016   History of CVA (cerebrovascular accident) 11/10/2016   Benign localized hyperplasia of prostate with urinary obstruction 10/27/2016   History of nephrolithiasis 10/27/2016   TIA (transient ischemic attack) 10/20/2016   Near syncope 06/23/2016   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   Neuropathy 08/06/2015   Health care maintenance 08/06/2015   Essential hypertension 08/06/2015   Heme positive stool 10/10/2013   Hypothyroidism 10/10/2013   Microalbuminuria 10/10/2013   Hyperlipidemia 10/10/2013   B12  deficiency 10/10/2013   Environmental allergies 07/04/2013    ONSET DATE: 04/28/2023  REFERRING DIAG: Swelling BLEs, ESRD, CVA  THERAPY DIAG:  Muscle weakness (generalized)  Other lack of coordination  Rationale for Evaluation and Treatment: Rehabilitation  SUBJECTIVE:   SUBJECTIVE STATEMENT: Pt reports having had a fall last week that he has been recovering from after tripping over is laundry basket. Pt accompanied by:  self  PERTINENT HISTORY: Pt. was hospitalized from 9/02-9/12/2022 for LE edema. Pt. was previously being followed by outpatient  OT services for weakness following a CVA. Pt. Restarted Hemodialysis. PMHx includes: ESRD on Hemodialysis, CVA, Hypothyroidism, Anemia, Type II DM, Volume overload, obesity, and noncompliant renal dialysis.  PRECAUTIONS: None  WEIGHT BEARING RESTRICTIONS: No  PAIN:  Are you having pain? 0/10  FALLS: Has patient fallen in last 6 months? No  LIVING ENVIRONMENT: Lives with:  Step daughter resides with the Pt.  Lives in: House/apartment Stairs:No external steps, Yes: Internal: 15 steps; on right going up Has following equipment at home: Walker - 2 wheeled  PLOF: Independent  PATIENT GOALS: To improve bilateral FMC primarily the left  OBJECTIVE:   HAND DOMINANCE: Right  ADLs: Transfers/ambulation related to ADLs: Eating: Independent, inefficient with cutting food Grooming: Independent UB Dressing: Independent, buttoning is challenging LB Dressing: Independent, difficulty with tying shoes Toileting: Independent Bathing: Independent Tub Shower transfers: Independent Equipment: none  IADLs: Shopping: Independent; difficulty with cash interaction Light housekeeping: Independent, slower than usual. Meal Prep: Independent, Requires increased time Community mobility: Drives Medication management: Difficulty setting handling small pills. Financial management: No changes Handwriting: 90% legible  MOBILITY STATUS: Independent  ACTIVITY TOLERANCE: Activity tolerance: Fatigues after 10-20 min.  FUNCTIONAL OUTCOME MEASURES: FOTO: TBD 2/2 set-up change required 05/22/23: FOTO: 65; predicted 65  UPPER EXTREMITY ROM:    Active ROM Right Eval WFL Left Eval [facility]  Shoulder flexion    Shoulder abduction    Shoulder adduction    Shoulder extension    Shoulder internal rotation    Shoulder external rotation    Elbow flexion    Elbow extension     Wrist flexion    Wrist extension    Wrist ulnar deviation    Wrist radial deviation    Wrist pronation    Wrist supination    (Blank rows = not tested)  UPPER EXTREMITY MMT:     MMT Right eval Left eval  Shoulder flexion 4/5 4/5  Shoulder abduction 4/5 4/5  Shoulder adduction    Shoulder extension    Shoulder internal rotation    Shoulder external rotation    Middle trapezius    Lower trapezius    Elbow flexion 4/5 4/5  Elbow extension 4/5 4/5  Wrist flexion 4/5 4/5  Wrist extension 4/5 4/5  Wrist ulnar deviation    Wrist radial deviation    Wrist pronation    Wrist supination    (Blank rows = not tested)  HAND FUNCTION: Grip strength: Right: 40 lbs; Left: 40 lbs, Lateral pinch: Right: 9 lbs, Left: 8 lbs, and 3 point pinch: Right: 6 lbs, Left: 5 lbs  COORDINATION:  9 Hole Peg test: Right: 32 sec; Left: 1 min & 8 sec. for *8* pegs only with multiple dropped pegs.  SENSATION:  Positive numbness increased in the Left 4th &5th digits, with mild numbness in all digits of the left hand  EDEMA: left   MUSCLE TONE: RUE: Hypotonic  COGNITION: Overall cognitive status: Within functional limits for tasks assessed  VISION: Subjective report: light  sensitivity, Retina specialist is following Pt.  Baseline vision: Wears glasses for reading only Visual history: macular degeneration Per Pt. Report: affects from years of Diabetes.  VISION ASSESSMENT: Continue to assess in functional context  PERCEPTION: WFL  PRAXIS: impaired  TODAY'S TREATMENT:                                                                                                                              DATE: 05/27/2023  Therapeutic Exercise:  Pt. performed gross gripping with a gross grip strengthener. Pt. worked on sustaining grip while grasping pegs and reaching at various heights. The gripper was set to 17.9 # of grip strength resistance x's 3 trials. Pt. worked on setting up the pegs using  translatory movements moving them through his hand. Pt. worked on Marketing executive in the lefthand for lateral, and 3pt. pinch using yellow, red, green, and blue resistive clips. Pt. worked on placing the clips at various vertical and horizontal angles. Tactile and verbal cues were required for eliciting the desired movement. Pt. worked on ROM for MP flexion with PIP, and DIP extension, and digit extension with place, and hold with resistance, as well as abduction, and adduction.  Neuromuscular re-education:  Pt. worked on left hand [facility] skills grasping 1" resistive cubes alternating thumb opposition to the tip of the 2nd through 5th digits while the board is placed at a vertical angle. Pt. worked on pressing the cubes back into place while alternating isolated 2nd through 5th digit extension.   PATIENT EDUCATION: Education details: Bilat hand strengthening exercises Person educated: Patient Education method: Medical illustrator Education comprehension: verbalized understanding and returned demonstration  HOME EXERCISE PROGRAM: Continue to assess, and provide as needed.   GOALS: Goals reviewed with patient? Yes  SHORT TERM GOALS: Target date:  07/01/2023      Pt will demonstrate ability to hold and utilize utensils for cutting meat with modified independence. Baseline:Eval: Pt. Has difficulty with cutting, stabilizing item with fork for cutting Goal status: INITIAL    LONG TERM GOALS: Target date:  08/12/2023  Pt will demonstrate HEP with modified independence.   Baseline: Eval: HEPs to be modified, and upgraded as needed since recent hospitalization. Goal status: INITIAL   2.  Pt will improve left UE coordination by 10 secs on 9 hole peg test to demonstrate shoe tying with modified independence.  Baseline: R: 32 sec., L: 1 min. & 8 sec. for *8 pegs only.Pt. hand difficulty with efficiently tying shoes. Goal status:  INITIAL   3.  Pt will demonstrate improved  coordination in left hand to demonstrate buttoning buttons on clothing with modified independence.  Baseline: Pt. Has difficulty manipulating buttons. Goal status: INITIAL   4.  Pt will demonstrate improvement in bilat grip strength by 3# to open jars and containers with modified independence and greater ease. Baseline: Eval: R: 40#, L: 40# Pt. Has difficulty with opening jars and containers with use of left hand.  Goal status: INITIAL  5.  Pt will improve left UE strength by 1 mm grade to hold and hand wash dishes with modified independence.  Baseline: BUE strength 4/5 Goal status: INITIAL   6.  Pt will Improve FOTO score by 2 points or greater to show a clinically relevant change in LUE to impact greater independence with daily ADL and IADL tasks at home and in the community.   Baseline: Eval: TBD 2/2 set-up change required; 05/22/23: 65 Goal status: INITIAL  ASSESSMENT:  CLINICAL IMPRESSION:  Pt. reports having had a fall after tripping over his laundry basket at the end of last week. Pt. tolerated 17.9# of grip strength resistance with the left hand when using the gripper to remove pegs from the pegboard for 3 trials. Pt. continues to present with intrinsic weakness in the left hand. Pt. continues to present with weakness in the bilateral hands which continues to contribute to difficulty opening containers and cutting meat.  Pt. will continue to benefit from OT services to work on improving BUE strength, hand function, and left hand [facility] skills in order to maximize independence with ADLs, and IADL tasks.   PERFORMANCE DEFICITS: in functional skills including ADLs, IADLs, coordination, dexterity, sensation, ROM, strength, Fine motor control, Gross motor control, balance, body mechanics, vision, and UE functional use.   IMPAIRMENTS: are limiting patient from ADLs, IADLs, leisure, and social participation.   CO-MORBIDITIES: may have co-morbidities  that affects occupational performance.  Patient will benefit from skilled OT to address above impairments and improve overall function.  MODIFICATION OR ASSISTANCE TO COMPLETE EVALUATION: Min-Moderate modification of tasks or assist with assess necessary to complete an evaluation.  OT OCCUPATIONAL PROFILE AND HISTORY: Detailed assessment: Review of records and additional review of physical, cognitive, psychosocial history related to current functional performance.  CLINICAL DECISION MAKING: Moderate - several treatment options, min-mod task modification necessary  REHAB POTENTIAL: Good  EVALUATION COMPLEXITY: Moderate    PLAN:  OT FREQUENCY: 2x/week  OT DURATION: 12 weeks  PLANNED INTERVENTIONS: self care/ADL training, therapeutic exercise, therapeutic activity, neuromuscular re-education, manual therapy, moist heat, patient/family education, and DME and/or AE instructions  RECOMMENDED OTHER SERVICES: PT  CONSULTED AND AGREED WITH PLAN OF CARE: Patient  PLAN FOR NEXT SESSION: see above  Olegario Messier, MS, OTR/L  05/27/2023, 4:28

## 2023-05-27 NOTE — Telephone Encounter (Signed)
Just an FYI. We have made several attempts to contact this patient including sending a letter to schedule or reschedule their echocardiogram. We will be removing the patient from the echo WQ.   05/19/23 [facility] to schedule @ 8:12/LBW  05/13/23 SENT LETTER THRU MY CHART LBW  05/13/23 [facility] to schedule x 3 @ 2:35/LBW  05/12/23 [facility] to schedule x 2 @ 1:01/LBW  05/01/23 [facility] to schedule @ 8:52/LBW           Thank you

## 2023-05-28 ENCOUNTER — Ambulatory Visit: Payer: Medicare Other

## 2023-05-28 ENCOUNTER — Ambulatory Visit: Payer: Medicare Other | Admitting: Occupational Therapy

## 2023-05-29 ENCOUNTER — Ambulatory Visit: Payer: Medicare Other

## 2023-06-02 ENCOUNTER — Ambulatory Visit: Payer: Medicare Other | Admitting: Occupational Therapy

## 2023-06-02 ENCOUNTER — Ambulatory Visit: Payer: Medicare Other

## 2023-06-03 ENCOUNTER — Ambulatory Visit: Payer: Medicare Other

## 2023-06-03 DIAGNOSIS — R278 Other lack of coordination: Secondary | ICD-10-CM

## 2023-06-03 DIAGNOSIS — M6281 Muscle weakness (generalized): Secondary | ICD-10-CM | POA: Diagnosis not present

## 2023-06-03 NOTE — Therapy (Signed)
OUTPATIENT OCCUPATIONAL THERAPY NEURO TREATMENT NOTE  Patient Name: Gregory Crane MRN: [id number] DOB:01/03/1971, 52 y.o., male Today's Date: 06/03/2023   REFERRING PROVIDER: Dale Noxon, MD  END OF SESSION:  OT End of Session - 06/03/23 1316     Visit Number 4    Number of Visits 24    Date for OT Re-Evaluation 08/12/23    Progress Note Due on Visit 10    OT Start Time 1115    OT Stop Time 1145    OT Time Calculation (min) 30 min    Activity Tolerance Patient tolerated treatment well    Behavior During Therapy [facility] for tasks assessed/performed            Past Medical History:  Diagnosis Date   Allergy    Anemia    Bell's palsy    Diabetes mellitus without complication (HCC)    diet controlled   Hypertension    Hypothyroidism    Kidney stones    Pseudotumor cerebri    Stroke [facility])    Past Surgical History:  Procedure Laterality Date   COLONOSCOPY WITH PROPOFOL N/A 03/30/2020   Procedure: COLONOSCOPY WITH PROPOFOL;  Surgeon: Regis Bill, MD;  Location: [facility] ENDOSCOPY;  Service: Endoscopy;  Laterality: N/A;   LOOP RECORDER INSERTION N/A 01/27/2018   Procedure: LOOP RECORDER INSERTION;  Surgeon: Duke Salvia, MD;  Location: [facility] INVASIVE CV LAB;  Service: Cardiovascular;  Laterality: N/A;   LUMBAR PUNCTURE     as child   NO PAST SURGERIES     TEE WITHOUT CARDIOVERSION N/A 01/07/2018   Procedure: TRANSESOPHAGEAL ECHOCARDIOGRAM (TEE);  Surgeon: Antonieta Iba, MD;  Location: [facility] ORS;  Service: Cardiovascular;  Laterality: N/A;   Patient Active Problem List   Diagnosis Date Noted   Non-compliance with renal dialysis (HCC) 05/02/2023   Obesity (BMI 30-39.9) 04/30/2023   Volume overload 04/29/2023   Unsteady gait 10/05/2022   Steal syndrome of dialysis vascular access (HCC) 05/28/2022   Hydronephrosis, left 05/04/2022   Leukocytosis 05/04/2022   Pre-op evaluation 04/24/2022   Chest pain 01/13/2022   Diabetic retinopathy associated with diabetes  mellitus due to underlying condition (HCC) 01/13/2022   ESRD on hemodialysis (HCC) 01/13/2022   Deafness in right ear 08/11/2021   Open wound 01/25/2021   History of colon polyps 10/15/2020   Postoperative hemorrhage involving digestive system following digestive system procedure 09/19/2020   Acute cholecystitis without calculus 09/09/2020   Type 2 diabetes mellitus, with long-term current use of insulin (HCC) 09/09/2020   Cryptogenic stroke (HCC) 07/13/2020   History of loop recorder 07/13/2020   History of 2019 novel coronavirus disease (COVID-19) 05/20/2020   Pneumonia due to COVID-19 virus 04/03/2020   Elevated troponin 04/03/2020   Acquired trigger finger 06/08/2019   Lymphedema 06/08/2019   Anemia 04/17/2019   Swelling of both lower extremities 01/10/2019   Facial droop 04/09/2018   Daytime somnolence 03/30/2018   Carotid artery disease (HCC) 02/03/2018   Intracranial vascular stenosis 09/12/2017   Cough 01/20/2017   Bell's palsy 11/10/2016   History of CVA (cerebrovascular accident) 11/10/2016   Benign localized hyperplasia of prostate with urinary obstruction 10/27/2016   History of nephrolithiasis 10/27/2016   TIA (transient ischemic attack) 10/20/2016   Near syncope 06/23/2016   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   Neuropathy 08/06/2015   Health care maintenance 08/06/2015   Essential hypertension 08/06/2015   Heme positive stool 10/10/2013   Hypothyroidism 10/10/2013   Microalbuminuria 10/10/2013   Hyperlipidemia 10/10/2013   B12 deficiency  10/10/2013   Environmental allergies 07/04/2013   ONSET DATE: 04/28/2023  REFERRING DIAG: Swelling BLEs, ESRD, CVA  THERAPY DIAG:  Muscle weakness (generalized)  Other lack of coordination  Rationale for Evaluation and Treatment: Rehabilitation  SUBJECTIVE:  SUBJECTIVE STATEMENT: Pt arrived late today, but was agreeable to shortened tx session.  Pt reported he's had a lot going on.   Pt accompanied by: self  PERTINENT  HISTORY: Pt. was hospitalized from 9/02-9/12/2022 for LE edema. Pt. was previously being followed by outpatient  OT services for weakness following a CVA. Pt. Restarted Hemodialysis. PMHx includes: ESRD on Hemodialysis, CVA, Hypothyroidism, Anemia, Type II DM, Volume overload, obesity, and noncompliant renal dialysis.  PRECAUTIONS: None  WEIGHT BEARING RESTRICTIONS: No  PAIN:  Are you having pain? 0/10  FALLS: Has patient fallen in last 6 months? No  LIVING ENVIRONMENT: Lives with:  Step daughter resides with the Pt.  Lives in: House/apartment Stairs:No external steps, Yes: Internal: 15 steps; on right going up Has following equipment at home: Walker - 2 wheeled  PLOF: Independent  PATIENT GOALS: To improve bilateral FMC primarily the left  OBJECTIVE:   HAND DOMINANCE: Right  ADLs: Transfers/ambulation related to ADLs: Eating: Independent, inefficient with cutting food Grooming: Independent UB Dressing: Independent, buttoning is challenging LB Dressing: Independent, difficulty with tying shoes Toileting: Independent Bathing: Independent Tub Shower transfers: Independent Equipment: none  IADLs: Shopping: Independent; difficulty with cash interaction Light housekeeping: Independent, slower than usual. Meal Prep: Independent, Requires increased time Community mobility: Drives Medication management: Difficulty setting handling small pills. Financial management: No changes Handwriting: 90% legible  MOBILITY STATUS: Independent  ACTIVITY TOLERANCE: Activity tolerance: Fatigues after 10-20 min.  FUNCTIONAL OUTCOME MEASURES: FOTO: TBD 2/2 set-up change required 05/22/23: FOTO: 65; predicted 65  UPPER EXTREMITY ROM:    Active ROM Right Eval WFL Left Eval [facility]  Shoulder flexion    Shoulder abduction    Shoulder adduction    Shoulder extension    Shoulder internal rotation    Shoulder external rotation    Elbow flexion    Elbow extension    Wrist flexion     Wrist extension    Wrist ulnar deviation    Wrist radial deviation    Wrist pronation    Wrist supination    (Blank rows = not tested)  UPPER EXTREMITY MMT:     MMT Right eval Left eval  Shoulder flexion 4/5 4/5  Shoulder abduction 4/5 4/5  Shoulder adduction    Shoulder extension    Shoulder internal rotation    Shoulder external rotation    Middle trapezius    Lower trapezius    Elbow flexion 4/5 4/5  Elbow extension 4/5 4/5  Wrist flexion 4/5 4/5  Wrist extension 4/5 4/5  Wrist ulnar deviation    Wrist radial deviation    Wrist pronation    Wrist supination    (Blank rows = not tested)  HAND FUNCTION: Grip strength: Right: 40 lbs; Left: 40 lbs, Lateral pinch: Right: 9 lbs, Left: 8 lbs, and 3 point pinch: Right: 6 lbs, Left: 5 lbs  COORDINATION:  9 Hole Peg test: Right: 32 sec; Left: 1 min & 8 sec. for *8* pegs only with multiple dropped pegs.  SENSATION:  Positive numbness increased in the Left 4th &5th digits, with mild numbness in all digits of the left hand  EDEMA: left   MUSCLE TONE: RUE: Hypotonic  COGNITION: Overall cognitive status: Within functional limits for tasks assessed  VISION: Subjective report: light sensitivity,  Retina specialist is following Pt.  Baseline vision: Wears glasses for reading only Visual history: macular degeneration Per Pt. Report: affects from years of Diabetes.  VISION ASSESSMENT: Continue to assess in functional context  PERCEPTION: WFL  PRAXIS: impaired  TODAY'S TREATMENT:                                                                                                                              DATE: 06/03/2023  Therapeutic Exercise: -Facilitated bilat hand strengthening with use of hand gripper set at 23.4# for 1 trial on the R, progressing to 28.9# for 2 more trials, and 23.4# for 1 trial on the L, reducing to 17.9 lbs for 2 more trials on the L to remove jumbo pegs from pegboard.  Alternated hands with each  trial.  -Performed BUE strengthening with 4# weighted alternating wrist dowel; 3 sets 10 reps R/L wrist flex, 3 sets 10 reps wrist ext, 3 sets 10 reps R/L forearm pron/sup.  Min vc for positioning/form.  PATIENT EDUCATION: Education details: BUE strengthening exercises Person educated: Patient Education method: Medical illustrator Education comprehension: verbalized understanding and returned demonstration  HOME EXERCISE PROGRAM: Continue to assess, and provide as needed.   GOALS: Goals reviewed with patient? Yes  SHORT TERM GOALS: Target date:  07/01/2023      Pt will demonstrate ability to hold and utilize utensils for cutting meat with modified independence. Baseline:Eval: Pt. Has difficulty with cutting, stabilizing item with fork for cutting Goal status: INITIAL    LONG TERM GOALS: Target date:  08/12/2023  Pt will demonstrate HEP with modified independence.   Baseline: Eval: HEPs to be modified, and upgraded as needed since recent hospitalization. Goal status: INITIAL   2.  Pt will improve left UE coordination by 10 secs on 9 hole peg test to demonstrate shoe tying with modified independence.  Baseline: R: 32 sec., L: 1 min. & 8 sec. for *8 pegs only.Pt. hand difficulty with efficiently tying shoes. Goal status:  INITIAL   3.  Pt will demonstrate improved coordination in left hand to demonstrate buttoning buttons on clothing with modified independence.  Baseline: Pt. Has difficulty manipulating buttons. Goal status: INITIAL   4.  Pt will demonstrate improvement in bilat grip strength by 3# to open jars and containers with modified independence and greater ease. Baseline: Eval: R: 40#, L: 40# Pt. Has difficulty with opening jars and containers with use of left hand. Goal status: INITIAL  5.  Pt will improve left UE strength by 1 mm grade to hold and hand wash dishes with modified independence.  Baseline: BUE strength 4/5 Goal status: INITIAL   6.  Pt will  Improve FOTO score by 2 points or greater to show a clinically relevant change in LUE to impact greater independence with daily ADL and IADL tasks at home and in the community.   Baseline: Eval: TBD 2/2 set-up change required; 05/22/23: 65 Goal status: INITIAL  ASSESSMENT:  CLINICAL IMPRESSION: Pt arrived late to OT but agreeable to shortened tx session this date.  Pt appeared more tired today, slower ambulation, also noted reduced tolerance for hand gripper today on the L hand (see above).  Pt did report they had to pull more fluid off of him at dialysis yesterday.  Pt also verbalized a lot going on in his personal life with family, as well as experiencing another fall a couple days after the recent fall reported in last OT note.  Pt reported that his friend visiting from [place] had taken him out to go putt putt golfing, and following their game, pt fell in the parking lot where there was an incline.  Pt reported both of these recent falls had contributed to back pain, but pt has since been to the chiropractor and reported "no pain to speak of today," and the chiropractor had helped.  Pt. will continue to benefit from OT services to work on improving BUE strength, hand function, and left hand [facility] skills in order to maximize independence with ADLs, and IADL tasks.   PERFORMANCE DEFICITS: in functional skills including ADLs, IADLs, coordination, dexterity, sensation, ROM, strength, Fine motor control, Gross motor control, balance, body mechanics, vision, and UE functional use.   IMPAIRMENTS: are limiting patient from ADLs, IADLs, leisure, and social participation.   CO-MORBIDITIES: may have co-morbidities  that affects occupational performance. Patient will benefit from skilled OT to address above impairments and improve overall function.  MODIFICATION OR ASSISTANCE TO COMPLETE EVALUATION: Min-Moderate modification of tasks or assist with assess necessary to complete an evaluation.  OT OCCUPATIONAL  PROFILE AND HISTORY: Detailed assessment: Review of records and additional review of physical, cognitive, psychosocial history related to current functional performance.  CLINICAL DECISION MAKING: Moderate - several treatment options, min-mod task modification necessary  REHAB POTENTIAL: Good  EVALUATION COMPLEXITY: Moderate    PLAN:  OT FREQUENCY: 2x/week  OT DURATION: 12 weeks  PLANNED INTERVENTIONS: self care/ADL training, therapeutic exercise, therapeutic activity, neuromuscular re-education, manual therapy, moist heat, patient/family education, and DME and/or AE instructions  RECOMMENDED OTHER SERVICES: PT  CONSULTED AND AGREED WITH PLAN OF CARE: Patient  PLAN FOR NEXT SESSION: see above  Danelle Earthly, MS, OTR/L  06/03/2023

## 2023-06-04 ENCOUNTER — Ambulatory Visit: Payer: Medicare Other | Admitting: Occupational Therapy

## 2023-06-04 ENCOUNTER — Ambulatory Visit: Payer: Medicare Other

## 2023-06-05 ENCOUNTER — Ambulatory Visit: Payer: Medicare Other

## 2023-06-05 DIAGNOSIS — M6281 Muscle weakness (generalized): Secondary | ICD-10-CM

## 2023-06-05 DIAGNOSIS — R278 Other lack of coordination: Secondary | ICD-10-CM

## 2023-06-05 NOTE — Therapy (Signed)
OUTPATIENT OCCUPATIONAL THERAPY NEURO TREATMENT NOTE  Patient Name: Gregory Crane MRN: [id number] DOB:1971/01/15, 52 y.o., male Today's Date: 06/05/2023   REFERRING PROVIDER: Dale McFarlan, MD  END OF SESSION:  OT End of Session - 06/05/23 1110     Visit Number 5    Number of Visits 24    Date for OT Re-Evaluation 08/12/23    Progress Note Due on Visit 10    OT Start Time 1107    OT Stop Time 1145    OT Time Calculation (min) 38 min    Activity Tolerance Patient tolerated treatment well    Behavior During Therapy WFL for tasks assessed/performed            Past Medical History:  Diagnosis Date   Allergy    Anemia    Bell's palsy    Diabetes mellitus without complication (HCC)    diet controlled   Hypertension    Hypothyroidism    Kidney stones    Pseudotumor cerebri    Stroke [facility])    Past Surgical History:  Procedure Laterality Date   COLONOSCOPY WITH PROPOFOL N/A 03/30/2020   Procedure: COLONOSCOPY WITH PROPOFOL;  Surgeon: Regis Bill, MD;  Location: [facility] ENDOSCOPY;  Service: Endoscopy;  Laterality: N/A;   LOOP RECORDER INSERTION N/A 01/27/2018   Procedure: LOOP RECORDER INSERTION;  Surgeon: Duke Salvia, MD;  Location: [facility] INVASIVE CV LAB;  Service: Cardiovascular;  Laterality: N/A;   LUMBAR PUNCTURE     as child   NO PAST SURGERIES     TEE WITHOUT CARDIOVERSION N/A 01/07/2018   Procedure: TRANSESOPHAGEAL ECHOCARDIOGRAM (TEE);  Surgeon: Antonieta Iba, MD;  Location: [facility] ORS;  Service: Cardiovascular;  Laterality: N/A;   Patient Active Problem List   Diagnosis Date Noted   Non-compliance with renal dialysis (HCC) 05/02/2023   Obesity (BMI 30-39.9) 04/30/2023   Volume overload 04/29/2023   Unsteady gait 10/05/2022   Steal syndrome of dialysis vascular access (HCC) 05/28/2022   Hydronephrosis, left 05/04/2022   Leukocytosis 05/04/2022   Pre-op evaluation 04/24/2022   Chest pain 01/13/2022   Diabetic retinopathy associated with diabetes  mellitus due to underlying condition (HCC) 01/13/2022   ESRD on hemodialysis (HCC) 01/13/2022   Deafness in right ear 08/11/2021   Open wound 01/25/2021   History of colon polyps 10/15/2020   Postoperative hemorrhage involving digestive system following digestive system procedure 09/19/2020   Acute cholecystitis without calculus 09/09/2020   Type 2 diabetes mellitus, with long-term current use of insulin (HCC) 09/09/2020   Cryptogenic stroke (HCC) 07/13/2020   History of loop recorder 07/13/2020   History of 2019 novel coronavirus disease (COVID-19) 05/20/2020   Pneumonia due to COVID-19 virus 04/03/2020   Elevated troponin 04/03/2020   Acquired trigger finger 06/08/2019   Lymphedema 06/08/2019   Anemia 04/17/2019   Swelling of both lower extremities 01/10/2019   Facial droop 04/09/2018   Daytime somnolence 03/30/2018   Carotid artery disease (HCC) 02/03/2018   Intracranial vascular stenosis 09/12/2017   Cough 01/20/2017   Bell's palsy 11/10/2016   History of CVA (cerebrovascular accident) 11/10/2016   Benign localized hyperplasia of prostate with urinary obstruction 10/27/2016   History of nephrolithiasis 10/27/2016   TIA (transient ischemic attack) 10/20/2016   Near syncope 06/23/2016   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   Neuropathy 08/06/2015   Health care maintenance 08/06/2015   Essential hypertension 08/06/2015   Heme positive stool 10/10/2013   Hypothyroidism 10/10/2013   Microalbuminuria 10/10/2013   Hyperlipidemia 10/10/2013   B12 deficiency  10/10/2013   Environmental allergies 07/04/2013   ONSET DATE: 04/28/2023  REFERRING DIAG: Swelling BLEs, ESRD, CVA  THERAPY DIAG:  Muscle weakness (generalized)  Other lack of coordination  Rationale for Evaluation and Treatment: Rehabilitation  SUBJECTIVE:  SUBJECTIVE STATEMENT: Pt reports that he had a meeting with the at home clinic for [facility] dialysis yesterday and reports that he is strongly considering the at home  peritoneal dialysis.  Pt reports this would likely happen at the beginning of the year at the earliest.   Pt accompanied by: self  PERTINENT HISTORY: Pt. was hospitalized from 9/02-9/12/2022 for LE edema. Pt. was previously being followed by outpatient  OT services for weakness following a CVA. Pt. Restarted Hemodialysis. PMHx includes: ESRD on Hemodialysis, CVA, Hypothyroidism, Anemia, Type II DM, Volume overload, obesity, and noncompliant renal dialysis.  PRECAUTIONS: None  WEIGHT BEARING RESTRICTIONS: No  PAIN:  Are you having pain? 0/10  FALLS: Has patient fallen in last 6 months? No  LIVING ENVIRONMENT: Lives with:  Step daughter resides with the Pt.  Lives in: House/apartment Stairs:No external steps, Yes: Internal: 15 steps; on right going up Has following equipment at home: Walker - 2 wheeled  PLOF: Independent  PATIENT GOALS: To improve bilateral FMC primarily the left  OBJECTIVE:   HAND DOMINANCE: Right  ADLs: Transfers/ambulation related to ADLs: Eating: Independent, inefficient with cutting food Grooming: Independent UB Dressing: Independent, buttoning is challenging LB Dressing: Independent, difficulty with tying shoes Toileting: Independent Bathing: Independent Tub Shower transfers: Independent Equipment: none  IADLs: Shopping: Independent; difficulty with cash interaction Light housekeeping: Independent, slower than usual. Meal Prep: Independent, Requires increased time Community mobility: Drives Medication management: Difficulty setting handling small pills. Financial management: No changes Handwriting: 90% legible  MOBILITY STATUS: Independent  ACTIVITY TOLERANCE: Activity tolerance: Fatigues after 10-20 min.  FUNCTIONAL OUTCOME MEASURES: FOTO: TBD 2/2 set-up change required 05/22/23: FOTO: 65; predicted 65  UPPER EXTREMITY ROM:    Active ROM Right Eval WFL Left Eval [facility]  Shoulder flexion    Shoulder abduction    Shoulder adduction     Shoulder extension    Shoulder internal rotation    Shoulder external rotation    Elbow flexion    Elbow extension    Wrist flexion    Wrist extension    Wrist ulnar deviation    Wrist radial deviation    Wrist pronation    Wrist supination    (Blank rows = not tested)  UPPER EXTREMITY MMT:     MMT Right eval Left eval  Shoulder flexion 4/5 4/5  Shoulder abduction 4/5 4/5  Shoulder adduction    Shoulder extension    Shoulder internal rotation    Shoulder external rotation    Middle trapezius    Lower trapezius    Elbow flexion 4/5 4/5  Elbow extension 4/5 4/5  Wrist flexion 4/5 4/5  Wrist extension 4/5 4/5  Wrist ulnar deviation    Wrist radial deviation    Wrist pronation    Wrist supination    (Blank rows = not tested)  HAND FUNCTION: Grip strength: Right: 40 lbs; Left: 40 lbs, Lateral pinch: Right: 9 lbs, Left: 8 lbs, and 3 point pinch: Right: 6 lbs, Left: 5 lbs  COORDINATION:  9 Hole Peg test: Right: 32 sec; Left: 1 min & 8 sec. for *8* pegs only with multiple dropped pegs.  SENSATION:  Positive numbness increased in the Left 4th &5th digits, with mild numbness in all digits of the left hand  EDEMA:  left   MUSCLE TONE: RUE: Hypotonic  COGNITION: Overall cognitive status: Within functional limits for tasks assessed  VISION: Subjective report: light sensitivity, Retina specialist is following Pt.  Baseline vision: Wears glasses for reading only Visual history: macular degeneration Per Pt. Report: affects from years of Diabetes.  VISION ASSESSMENT: Continue to assess in functional context  PERCEPTION: WFL  PRAXIS: impaired  TODAY'S TREATMENT:                                                                                                                              DATE: 06/05/2023  Therapeutic Exercise: -Facilitated bilat hand strengthening with use of hand gripper set at 28.9# for 3 trials on the R, 23.4# for 3 trials on the L to remove jumbo  pegs from pegboard.  Alternated hands with each trial. -Facilitated L/R forearm, wrist, and hand strengthening with participation in EZ board tools.  Pt worked with long handled tool to facilitate L/R wrist flex/ext and forearm pron/sup, large base key turn, and large dial turn x3 reps for each tool.  Graded large dial turn down with placement of tool 1/2 way on velcro strip for less resistance and required min guard to maintain tool level on board d/t weakness and decreased coordination.  PATIENT EDUCATION: Education details: BUE strengthening exercises Person educated: Patient Education method: Medical illustrator Education comprehension: verbalized understanding and returned demonstration  HOME EXERCISE PROGRAM: Continue to assess, and provide as needed.   GOALS: Goals reviewed with patient? Yes  SHORT TERM GOALS: Target date:  07/01/2023      Pt will demonstrate ability to hold and utilize utensils for cutting meat with modified independence. Baseline:Eval: Pt. Has difficulty with cutting, stabilizing item with fork for cutting Goal status: INITIAL    LONG TERM GOALS: Target date:  08/12/2023  Pt will demonstrate HEP with modified independence.   Baseline: Eval: HEPs to be modified, and upgraded as needed since recent hospitalization. Goal status: INITIAL   2.  Pt will improve left UE coordination by 10 secs on 9 hole peg test to demonstrate shoe tying with modified independence.  Baseline: R: 32 sec., L: 1 min. & 8 sec. for *8 pegs only.Pt. hand difficulty with efficiently tying shoes. Goal status:  INITIAL   3.  Pt will demonstrate improved coordination in left hand to demonstrate buttoning buttons on clothing with modified independence.  Baseline: Pt. Has difficulty manipulating buttons. Goal status: INITIAL   4.  Pt will demonstrate improvement in bilat grip strength by 3# to open jars and containers with modified independence and greater ease. Baseline: Eval: R:  40#, L: 40# Pt. Has difficulty with opening jars and containers with use of left hand. Goal status: INITIAL  5.  Pt will improve left UE strength by 1 mm grade to hold and hand wash dishes with modified independence.  Baseline: BUE strength 4/5 Goal status: INITIAL   6.  Pt will Improve FOTO score by 2  points or greater to show a clinically relevant change in LUE to impact greater independence with daily ADL and IADL tasks at home and in the community.   Baseline: Eval: TBD 2/2 set-up change required; 05/22/23: 65 Goal status: INITIAL  ASSESSMENT:  CLINICAL IMPRESSION: Pt reports increased fatigue today after sleeping maybe an hour last night.  Overall pt tolerated BUE strengthening well this date.  Pt tolerated 3 sets on the hand gripper for each hand as noted above to remove jumbo pegs from pegboard.  Pt required assist with large dial turn on EZ board when manipulating with L hand, grading resistance down and assist to keep tool level on board d/t weakness and decreased coordination.  Pt. will continue to benefit from OT services to work on improving BUE strength, hand function, and left hand [facility] skills in order to maximize independence with ADLs, and IADL tasks.   PERFORMANCE DEFICITS: in functional skills including ADLs, IADLs, coordination, dexterity, sensation, ROM, strength, Fine motor control, Gross motor control, balance, body mechanics, vision, and UE functional use.   IMPAIRMENTS: are limiting patient from ADLs, IADLs, leisure, and social participation.   CO-MORBIDITIES: may have co-morbidities  that affects occupational performance. Patient will benefit from skilled OT to address above impairments and improve overall function.  MODIFICATION OR ASSISTANCE TO COMPLETE EVALUATION: Min-Moderate modification of tasks or assist with assess necessary to complete an evaluation.  OT OCCUPATIONAL PROFILE AND HISTORY: Detailed assessment: Review of records and additional review of physical,  cognitive, psychosocial history related to current functional performance.  CLINICAL DECISION MAKING: Moderate - several treatment options, min-mod task modification necessary  REHAB POTENTIAL: Good  EVALUATION COMPLEXITY: Moderate    PLAN:  OT FREQUENCY: 2x/week  OT DURATION: 12 weeks  PLANNED INTERVENTIONS: self care/ADL training, therapeutic exercise, therapeutic activity, neuromuscular re-education, manual therapy, moist heat, patient/family education, and DME and/or AE instructions  RECOMMENDED OTHER SERVICES: PT  CONSULTED AND AGREED WITH PLAN OF CARE: Patient  PLAN FOR NEXT SESSION: see above  Danelle Earthly, MS, OTR/L  06/05/2023

## 2023-06-09 ENCOUNTER — Ambulatory Visit: Payer: Medicare Other

## 2023-06-09 ENCOUNTER — Ambulatory Visit: Payer: Medicare Other | Admitting: Internal Medicine

## 2023-06-09 ENCOUNTER — Encounter: Payer: Self-pay | Admitting: Internal Medicine

## 2023-06-09 VITALS — BP 136/72 | HR 78 | Temp 98.2°F | Resp 16 | Ht 72.0 in | Wt 259.0 lb

## 2023-06-09 DIAGNOSIS — E11319 Type 2 diabetes mellitus with unspecified diabetic retinopathy without macular edema: Secondary | ICD-10-CM

## 2023-06-09 DIAGNOSIS — G629 Polyneuropathy, unspecified: Secondary | ICD-10-CM | POA: Diagnosis not present

## 2023-06-09 DIAGNOSIS — E1122 Type 2 diabetes mellitus with diabetic chronic kidney disease: Secondary | ICD-10-CM

## 2023-06-09 DIAGNOSIS — E08319 Diabetes mellitus due to underlying condition with unspecified diabetic retinopathy without macular edema: Secondary | ICD-10-CM

## 2023-06-09 DIAGNOSIS — Z992 Dependence on renal dialysis: Secondary | ICD-10-CM

## 2023-06-09 DIAGNOSIS — Z23 Encounter for immunization: Secondary | ICD-10-CM

## 2023-06-09 DIAGNOSIS — Z8673 Personal history of transient ischemic attack (TIA), and cerebral infarction without residual deficits: Secondary | ICD-10-CM

## 2023-06-09 DIAGNOSIS — D649 Anemia, unspecified: Secondary | ICD-10-CM

## 2023-06-09 DIAGNOSIS — E785 Hyperlipidemia, unspecified: Secondary | ICD-10-CM

## 2023-06-09 DIAGNOSIS — M7989 Other specified soft tissue disorders: Secondary | ICD-10-CM | POA: Diagnosis not present

## 2023-06-09 DIAGNOSIS — I10 Essential (primary) hypertension: Secondary | ICD-10-CM

## 2023-06-09 DIAGNOSIS — Z794 Long term (current) use of insulin: Secondary | ICD-10-CM | POA: Diagnosis not present

## 2023-06-09 DIAGNOSIS — W19XXXD Unspecified fall, subsequent encounter: Secondary | ICD-10-CM

## 2023-06-09 DIAGNOSIS — N186 End stage renal disease: Secondary | ICD-10-CM

## 2023-06-09 DIAGNOSIS — E039 Hypothyroidism, unspecified: Secondary | ICD-10-CM

## 2023-06-09 DIAGNOSIS — I779 Disorder of arteries and arterioles, unspecified: Secondary | ICD-10-CM

## 2023-06-09 NOTE — Progress Notes (Signed)
Subjective:    Patient ID: Gregory Crane, male    DOB: 25-Apr-1971, 52 y.o.   MRN: [id number]  Patient here for  Chief Complaint  Patient presents with   Medical Management of Chronic Issues    HPI Here for a scheduled follow up.  Admitted 04/28/23 - 05/01/23 - after presenting with increased edema. Had stopped hemodialysis.  Restarted on HD in hospital. Signed out AMA.  Going to dialysis now at [facility].  Tolerating dialysis. No chest pain.  Breathing stable.  Eating.  No abdominal pain.  Had three weeks of diarrhea prior to the hospitalization.  Since being out of the hospital - no diarrhea.  Has been off his insulin for 2 months.  Blood sugars averaging 105-115 in am.  He is still unsteady.  Fell at [employer] uneven.  No residual injury or pain.  He feels overall things are stable.     Past Medical History:  Diagnosis Date   Allergy    Anemia    Bell's palsy    Diabetes mellitus without complication (HCC)    diet controlled   Hypertension    Hypothyroidism    Kidney stones    Pseudotumor cerebri    Stroke [facility])    Past Surgical History:  Procedure Laterality Date   COLONOSCOPY WITH PROPOFOL N/A 03/30/2020   Procedure: COLONOSCOPY WITH PROPOFOL;  Surgeon: Regis Bill, MD;  Location: [facility] ENDOSCOPY;  Service: Endoscopy;  Laterality: N/A;   LOOP RECORDER INSERTION N/A 01/27/2018   Procedure: LOOP RECORDER INSERTION;  Surgeon: Duke Salvia, MD;  Location: [facility] INVASIVE CV LAB;  Service: Cardiovascular;  Laterality: N/A;   LUMBAR PUNCTURE     as child   NO PAST SURGERIES     TEE WITHOUT CARDIOVERSION N/A 01/07/2018   Procedure: TRANSESOPHAGEAL ECHOCARDIOGRAM (TEE);  Surgeon: Antonieta Iba, MD;  Location: [facility] ORS;  Service: Cardiovascular;  Laterality: N/A;   Family History  Problem Relation Age of Onset   Breast cancer Mother    Diabetes Father    Diabetes Sister    Arthritis Maternal Grandmother    Diabetes Maternal Grandmother    Social  History   Socioeconomic History   Marital status: Widowed    Spouse name: Carollee Herter   Number of children: 2   Years of education: Not on file   Highest education level: Not on file  Occupational History   Not on file  Tobacco Use   Smoking status: Never   Smokeless tobacco: Never  Vaping Use   Vaping status: Never Used  Substance and Sexual Activity   Alcohol use: No    Alcohol/week: 0.0 standard drinks of alcohol    Comment: very rarely (1-2/year)   Drug use: No   Sexual activity: Yes  Other Topics Concern   Not on file  Social History Narrative   Lives at home with wife, independent at baseline.   Social Determinants of Health   Financial Resource Strain: High Risk (06/26/2021)   Overall Financial Resource Strain (CARDIA)    Difficulty of Paying Living Expenses: Hard  Food Insecurity: No Food Insecurity (04/28/2023)   Hunger Vital Sign    Worried About Running Out of Food in the Last Year: Never true    Ran Out of Food in the Last Year: Never true  Transportation Needs: No Transportation Needs (04/28/2023)   PRAPARE - Administrator, Civil Service (Medical): No    Lack of Transportation (Non-Medical):  No  Physical Activity: Inactive (09/13/2017)   Exercise Vital Sign    Days of Exercise per Week: 0 days    Minutes of Exercise per Session: 0 min  Stress: Stress Concern Present (09/13/2017)   [employer] of Occupational Health - Occupational Stress Questionnaire    Feeling of Stress : To some extent  Social Connections: Moderately Integrated (09/13/2017)   Social Connection and Isolation Panel [NHANES]    Frequency of Communication with Friends and Family: More than three times a week    Frequency of Social Gatherings with Friends and Family: More than three times a week    Attends Religious Services: More than 4 times per year    Active Member of [employer] or Organizations: No    Attends Banker Meetings: Never    Marital Status: Married      Review of Systems  Constitutional:  Negative for appetite change and unexpected weight change.  HENT:  Negative for congestion and sinus pressure.   Respiratory:  Negative for cough and chest tightness.        Breathing stable.   Cardiovascular:  Negative for chest pain and palpitations.       Leg swelling improved.   Gastrointestinal:  Negative for abdominal pain, diarrhea, nausea and vomiting.  Genitourinary:  Negative for dysuria.  Musculoskeletal:  Negative for joint swelling and myalgias.  Skin:  Negative for color change and rash.  Neurological:  Negative for dizziness and headaches.  Psychiatric/Behavioral:  Negative for agitation and dysphoric mood.        Objective:     BP 136/72   Pulse 78   Temp 98.2 F (36.8 C)   Resp 16   Ht 6' (1.829 m)   Wt 259 lb (117.5 kg)   SpO2 98%   BMI 35.13 kg/m  Wt Readings from Last 3 Encounters:  06/09/23 259 lb (117.5 kg)  05/01/23 264 lb 1.8 oz (119.8 kg)  02/05/23 254 lb (115.2 kg)    Physical Exam Constitutional:      General: He is not in acute distress.    Appearance: Normal appearance. He is well-developed.  HENT:     Head: Normocephalic and atraumatic.     Right Ear: External ear normal.     Left Ear: External ear normal.  Eyes:     General: No scleral icterus.       Right eye: No discharge.        Left eye: No discharge.  Cardiovascular:     Rate and Rhythm: Normal rate and regular rhythm.  Pulmonary:     Effort: Pulmonary effort is normal. No respiratory distress.     Breath sounds: Normal breath sounds.  Abdominal:     General: Bowel sounds are normal.     Palpations: Abdomen is soft.     Tenderness: There is no abdominal tenderness.  Musculoskeletal:        General: No tenderness.     Cervical back: Neck supple. No tenderness.     Comments: Pedal and lower extremity swelling - L>R (chronic)  Lymphadenopathy:     Cervical: No cervical adenopathy.  Skin:    Findings: No erythema or rash.   Neurological:     Mental Status: He is alert.  Psychiatric:        Mood and Affect: Mood normal.        Behavior: Behavior normal.      Outpatient Encounter Medications as of 06/09/2023  Medication Sig   Accu-Chek FastClix  Lancets MISC USE TO CHECK BLOOD SUGAR THREE TIMES DAILY AS DIRECTED   amLODipine (NORVASC) 10 MG tablet TAKE 1 TABLET(10 MG) BY MOUTH DAILY   aspirin (ASPIRIN LOW DOSE) 81 MG EC tablet TAKE 1 TABLET(81 MG) BY MOUTH DAILY   glucose blood (FREESTYLE LITE) test strip Check blood sugars twice a day (Dx. 250.02)   hydrALAZINE (APRESOLINE) 50 MG tablet TAKE 1 TABLET(50 MG) BY MOUTH THREE TIMES DAILY   insulin glargine-yfgn (SEMGLEE, YFGN,) 100 UNIT/ML injection INJECT 10- 15 UNITS INTO THE SKIN DAILY   insulin lispro (HUMALOG) 100 UNIT/ML injection Inject 4-12 units three times daily before meals per sliding scale: If premeal sugar is <130, inject 4 units; if 131-180, inject 6 units; if 181-240, inject 8 units; if 241-300, inject 10 units, if  >300, inject 12 units (max daily dose 36 units)   Insulin Syringe-Needle U-100 (INSULIN SYRINGE .5CC/30GX5/16") 30G X 5/16" 0.5 ML MISC USE AS DIRECTED WITH INSULIN   levothyroxine (SYNTHROID) 175 MCG tablet TAKE 1 TABLET (175 MCG) BY MOUTH ONCE DAILY BEFORE BREAKFAST.   pregabalin (LYRICA) 100 MG capsule TAKE 1 CAPSULE(100 MG) BY MOUTH THREE TIMES DAILY (Patient taking differently: Take 100 mg by mouth daily.)   RENVELA 800 MG tablet Take 800 mg by mouth 3 (three) times daily.   rosuvastatin (CRESTOR) 5 MG tablet Take 5 mg by mouth daily.   No facility-administered encounter medications on file as of 06/09/2023.     Lab Results  Component Value Date   WBC 7.0 05/01/2023   HGB 10.4 (L) 05/01/2023   HCT 30.7 (L) 05/01/2023   PLT 185 05/01/2023   GLUCOSE 80 05/01/2023   CHOL 172 10/04/2022   TRIG 140.0 10/04/2022   HDL 36.20 (L) 10/04/2022   LDLDIRECT 107.0 06/26/2021   LDLCALC 108 (H) 10/04/2022   ALT 10 04/28/2023   AST 13  (L) 04/28/2023   NA 139 05/01/2023   K 3.2 (L) 05/01/2023   CL 104 05/01/2023   CREATININE 6.70 (H) 05/01/2023   BUN 42 (H) 05/01/2023   CO2 23 05/01/2023   TSH 16.533 (H) 04/30/2023   PSA 0.48 10/04/2022   INR 1.0 05/04/2022   HGBA1C 5.8 (H) 04/28/2023    CT ABDOMEN PELVIS WO CONTRAST  Result Date: 04/28/2023 CLINICAL DATA:  Abdominal pain, acute, nonlocalized EXAM: CT ABDOMEN AND PELVIS WITHOUT CONTRAST TECHNIQUE: Multidetector CT imaging of the abdomen and pelvis was performed following the standard protocol without IV contrast. RADIATION DOSE REDUCTION: This exam was performed according to the departmental dose-optimization program which includes automated exposure control, adjustment of the mA and/or kV according to patient size and/or use of iterative reconstruction technique. COMPARISON:  None Available. FINDINGS: Lower chest: Trace bilateral pleural effusions. Left base atelectasis. Hepatobiliary: No focal liver abnormality is seen. Status post cholecystectomy. No biliary dilatation. Pancreas: No focal abnormality or ductal dilatation. Spleen: No focal abnormality.  Normal size. Adrenals/Urinary Tract: Adrenal glands normal. Bilateral perinephric stranding is stable since prior study. No stones or hydronephrosis. No renal mass. Urinary bladder unremarkable. Stomach/Bowel: Stomach, large and small bowel grossly unremarkable. Appendix normal. Vascular/Lymphatic: No evidence of aneurysm or adenopathy. Scattered aortic atherosclerosis. Reproductive: No visible focal abnormality. Other: No free fluid or free air. Diffuse edema throughout the abdominal wall compatible with anasarca. Musculoskeletal: No acute bony abnormality. IMPRESSION: No acute findings in the abdomen or pelvis. Previously seen left hydronephrosis has resolved. Extensive nonspecific perinephric stranding is stable. Trace bilateral pleural effusions.  Left base atelectasis. Anasarca throughout the abdominal wall. Electronically  Signed   By: Charlett Nose M.D.   On: 04/28/2023 21:19   DG Chest 2 View  Result Date: 04/28/2023 CLINICAL DATA:  Shortness of breath EXAM: CHEST - 2 VIEW COMPARISON:  04/03/2020 FINDINGS: Heart and mediastinal contours within normal limits. No confluent airspace opacities or effusions. No acute bony abnormality. IMPRESSION: No active cardiopulmonary disease. Electronically Signed   By: Charlett Nose M.D.   On: 04/28/2023 19:21       Assessment & Plan:  Need for influenza vaccination -     Flu vaccine trivalent PF, 6mos and older(Flulaval,Afluria,Fluarix,Fluzone)  Type 2 diabetes mellitus with chronic kidney disease on chronic dialysis, with long-term current use of insulin (HCC) Assessment & Plan: Low carb diet and exercise.  No change in medication.  Seeing endocrinology.  Last A1c last check -04/28/23 - 5.9. . Follow met b and a1c. Off insulin.  Am sugars averaging 105-115.     Swelling of both lower extremities Assessment & Plan: Has seen [facility] - swelling was improved from hospitalization. Avoid increased sodium intake.  Elevate legs.    Neuropathy Assessment & Plan: Continue lyrica.  Follow.    Acquired hypothyroidism Assessment & Plan: On thyroid replacement.  Follow tsh. Instructed on importance of taking regularly.     Hyperlipidemia, unspecified hyperlipidemia type Assessment & Plan: Low cholesterol diet and exercise.  Follow lipid panel and liver function tests.     Bilateral carotid artery disease, unspecified type (HCC) Assessment & Plan: Last carotid ultrasound - <50% bilaterally.  Needs to take statin and keep blood pressure under control.    ESRD on hemodialysis [facility]) Assessment & Plan: Continue hemodialysis.  Followed by nephrology.  Follows labs through dialysis. Had labs this am. Obtain results    Anemia, unspecified type Assessment & Plan: Labs have been followed through dialysis.  Obtain lab results.     Diabetic retinopathy associated with diabetes  mellitus due to underlying condition, macular edema presence unspecified, unspecified laterality, unspecified retinopathy severity (HCC) Assessment & Plan: Sees endocrinology. Low carb diet.  Dexcom.  Follow.  Most recent A1c 5.9. Continue f/u with ophthalmology.    Essential hypertension Assessment & Plan: Blood pressure has been doing better.  Continue hydralazine and amlodipine.  Follow pressures.  Follow metabolic panel.    History of CVA (cerebrovascular accident) Assessment & Plan: Continue aspirin and lipitor.  Continue blood pressure control.      Fall, subsequent encounter Assessment & Plan: Recent fall as outlined.  Exercise/PT discussed.  Follow.       Dale [place], MD

## 2023-06-10 ENCOUNTER — Ambulatory Visit: Payer: Medicare Other

## 2023-06-10 DIAGNOSIS — R278 Other lack of coordination: Secondary | ICD-10-CM

## 2023-06-10 DIAGNOSIS — M6281 Muscle weakness (generalized): Secondary | ICD-10-CM

## 2023-06-10 NOTE — Therapy (Signed)
OUTPATIENT OCCUPATIONAL THERAPY NEURO TREATMENT NOTE  Patient Name: Gregory Crane MRN: [id number] DOB:09-15-1970, 52 y.o., male Today's Date: 06/10/2023   REFERRING PROVIDER: Dale [place], MD  END OF SESSION:  OT End of Session - 06/10/23 1147     Visit Number 6    Number of Visits 24    Date for OT Re-Evaluation 08/12/23    Progress Note Due on Visit 10    OT Start Time 1145    OT Stop Time 1230    OT Time Calculation (min) 45 min    Activity Tolerance Patient tolerated treatment well    Behavior During Therapy [facility] for tasks assessed/performed            Past Medical History:  Diagnosis Date   Allergy    Anemia    Bell's palsy    Diabetes mellitus without complication (HCC)    diet controlled   Hypertension    Hypothyroidism    Kidney stones    Pseudotumor cerebri    Stroke [facility])    Past Surgical History:  Procedure Laterality Date   COLONOSCOPY WITH PROPOFOL N/A 03/30/2020   Procedure: COLONOSCOPY WITH PROPOFOL;  Surgeon: Regis Bill, MD;  Location: [facility] ENDOSCOPY;  Service: Endoscopy;  Laterality: N/A;   LOOP RECORDER INSERTION N/A 01/27/2018   Procedure: LOOP RECORDER INSERTION;  Surgeon: Duke Salvia, MD;  Location: [facility] INVASIVE CV LAB;  Service: Cardiovascular;  Laterality: N/A;   LUMBAR PUNCTURE     as child   NO PAST SURGERIES     TEE WITHOUT CARDIOVERSION N/A 01/07/2018   Procedure: TRANSESOPHAGEAL ECHOCARDIOGRAM (TEE);  Surgeon: Antonieta Iba, MD;  Location: [facility] ORS;  Service: Cardiovascular;  Laterality: N/A;   Patient Active Problem List   Diagnosis Date Noted   Non-compliance with renal dialysis (HCC) 05/02/2023   Obesity (BMI 30-39.9) 04/30/2023   Volume overload 04/29/2023   Unsteady gait 10/05/2022   Steal syndrome of dialysis vascular access (HCC) 05/28/2022   Hydronephrosis, left 05/04/2022   Leukocytosis 05/04/2022   Pre-op evaluation 04/24/2022   Chest pain 01/13/2022   Diabetic retinopathy associated with diabetes  mellitus due to underlying condition (HCC) 01/13/2022   ESRD on hemodialysis (HCC) 01/13/2022   Deafness in right ear 08/11/2021   Open wound 01/25/2021   History of colon polyps 10/15/2020   Postoperative hemorrhage involving digestive system following digestive system procedure 09/19/2020   Acute cholecystitis without calculus 09/09/2020   Type 2 diabetes mellitus, with long-term current use of insulin (HCC) 09/09/2020   Cryptogenic stroke (HCC) 07/13/2020   History of loop recorder 07/13/2020   History of 2019 novel coronavirus disease (COVID-19) 05/20/2020   Pneumonia due to COVID-19 virus 04/03/2020   Elevated troponin 04/03/2020   Acquired trigger finger 06/08/2019   Lymphedema 06/08/2019   Anemia 04/17/2019   Swelling of both lower extremities 01/10/2019   Facial droop 04/09/2018   Daytime somnolence 03/30/2018   Carotid artery disease (HCC) 02/03/2018   Intracranial vascular stenosis 09/12/2017   Cough 01/20/2017   Bell's palsy 11/10/2016   History of CVA (cerebrovascular accident) 11/10/2016   Benign localized hyperplasia of prostate with urinary obstruction 10/27/2016   History of nephrolithiasis 10/27/2016   TIA (transient ischemic attack) 10/20/2016   Near syncope 06/23/2016   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   Neuropathy 08/06/2015   Health care maintenance 08/06/2015   Essential hypertension 08/06/2015   Heme positive stool 10/10/2013   Hypothyroidism 10/10/2013   Microalbuminuria 10/10/2013   Hyperlipidemia 10/10/2013   B12 deficiency  10/10/2013   Environmental allergies 07/04/2013   ONSET DATE: 04/28/2023  REFERRING DIAG: Swelling BLEs, ESRD, CVA  THERAPY DIAG:  Muscle weakness (generalized)  Other lack of coordination  Rationale for Evaluation and Treatment: Rehabilitation  SUBJECTIVE:  SUBJECTIVE STATEMENT: Pt reports that he has a consultation visit next week for scheduling surgery for transitioning to peritoneal dialysis in the home. Pt accompanied  by: self  PERTINENT HISTORY: Pt. was hospitalized from 9/02-9/12/2022 for LE edema. Pt. was previously being followed by outpatient  OT services for weakness following a CVA. Pt. Restarted Hemodialysis. PMHx includes: ESRD on Hemodialysis, CVA, Hypothyroidism, Anemia, Type II DM, Volume overload, obesity, and noncompliant renal dialysis.  PRECAUTIONS: None  WEIGHT BEARING RESTRICTIONS: No  PAIN:  Are you having pain? 0/10  FALLS: Has patient fallen in last 6 months? No  LIVING ENVIRONMENT: Lives with:  Step daughter resides with the Pt.  Lives in: House/apartment Stairs:No external steps, Yes: Internal: 15 steps; on right going up Has following equipment at home: Walker - 2 wheeled  PLOF: Independent  PATIENT GOALS: To improve bilateral FMC primarily the left  OBJECTIVE:   HAND DOMINANCE: Right  ADLs: Transfers/ambulation related to ADLs: Eating: Independent, inefficient with cutting food Grooming: Independent UB Dressing: Independent, buttoning is challenging LB Dressing: Independent, difficulty with tying shoes Toileting: Independent Bathing: Independent Tub Shower transfers: Independent Equipment: none  IADLs: Shopping: Independent; difficulty with cash interaction Light housekeeping: Independent, slower than usual. Meal Prep: Independent, Requires increased time Community mobility: Drives Medication management: Difficulty setting handling small pills. Financial management: No changes Handwriting: 90% legible  MOBILITY STATUS: Independent  ACTIVITY TOLERANCE: Activity tolerance: Fatigues after 10-20 min.  FUNCTIONAL OUTCOME MEASURES: FOTO: TBD 2/2 set-up change required 05/22/23: FOTO: 65; predicted 65  UPPER EXTREMITY ROM:    Active ROM Right Eval WFL Left Eval [facility]  Shoulder flexion    Shoulder abduction    Shoulder adduction    Shoulder extension    Shoulder internal rotation    Shoulder external rotation    Elbow flexion    Elbow extension     Wrist flexion    Wrist extension    Wrist ulnar deviation    Wrist radial deviation    Wrist pronation    Wrist supination    (Blank rows = not tested)  UPPER EXTREMITY MMT:     MMT Right eval Left eval  Shoulder flexion 4/5 4/5  Shoulder abduction 4/5 4/5  Shoulder adduction    Shoulder extension    Shoulder internal rotation    Shoulder external rotation    Middle trapezius    Lower trapezius    Elbow flexion 4/5 4/5  Elbow extension 4/5 4/5  Wrist flexion 4/5 4/5  Wrist extension 4/5 4/5  Wrist ulnar deviation    Wrist radial deviation    Wrist pronation    Wrist supination    (Blank rows = not tested)  HAND FUNCTION: Grip strength: Right: 40 lbs; Left: 40 lbs, Lateral pinch: Right: 9 lbs, Left: 8 lbs, and 3 point pinch: Right: 6 lbs, Left: 5 lbs  COORDINATION:  9 Hole Peg test: Right: 32 sec; Left: 1 min & 8 sec. for *8* pegs only with multiple dropped pegs.  SENSATION:  Positive numbness increased in the Left 4th &5th digits, with mild numbness in all digits of the left hand  EDEMA: left   MUSCLE TONE: RUE: Hypotonic  COGNITION: Overall cognitive status: Within functional limits for tasks assessed  VISION: Subjective report: light sensitivity, Retina  specialist is following Pt.  Baseline vision: Wears glasses for reading only Visual history: macular degeneration Per Pt. Report: affects from years of Diabetes.  VISION ASSESSMENT: Continue to assess in functional context  PERCEPTION: WFL  PRAXIS: impaired  TODAY'S TREATMENT:                                                                                                                              DATE: 06/10/2023  Therapeutic Activity: -Facilitated sustained pinch strengthening for opening snack packages: pt worked on high reps (1" tears, 1" width apart along border of 6 layers of folded paper).  Several rest breaks needed to complete whole border.  Therapeutic Exercise: -Pt worked on R/L hand  strengthening using Puttycize  tools with yellow theraputty using the knob turn, cap turn, L-Bar and key turn attachments to target specific components of movements in each hand. -Targeted intrinsic hand strengthening working to sustain lumbrical grasp holding notebook 1.25" thick in the R/L hands against a slight tug, progressing to 1/4" thick note book paper on the R hand (unable to sustain grasp on the L hand with this width).   -Completed reps of MP flex/ext with simultaneous PIP ext, and active assisted digit abd/add R/L hands x10 reps each.  Pt requires PIP blocking to prevent PIP flexion when attempting a lumbrical grasp in the L hand and is unable to resist a slight tug without breaking from position.  PATIENT EDUCATION: Education details: intrinsic hand strengthening exercises bilat hands Person educated: Patient Education method: Medical illustrator Education comprehension: verbalized understanding and returned demonstration  HOME EXERCISE PROGRAM: Theraputty  GOALS: Goals reviewed with patient? Yes  SHORT TERM GOALS: Target date:  07/01/2023      Pt will demonstrate ability to hold and utilize utensils for cutting meat with modified independence. Baseline:Eval: Pt. Has difficulty with cutting, stabilizing item with fork for cutting Goal status: INITIAL    LONG TERM GOALS: Target date:  08/12/2023  Pt will demonstrate HEP with modified independence.   Baseline: Eval: HEPs to be modified, and upgraded as needed since recent hospitalization. Goal status: INITIAL   2.  Pt will improve left UE coordination by 10 secs on 9 hole peg test to demonstrate shoe tying with modified independence.  Baseline: R: 32 sec., L: 1 min. & 8 sec. for *8 pegs only.Pt. hand difficulty with efficiently tying shoes. Goal status:  INITIAL   3.  Pt will demonstrate improved coordination in left hand to demonstrate buttoning buttons on clothing with modified independence.  Baseline: Pt. Has  difficulty manipulating buttons. Goal status: INITIAL   4.  Pt will demonstrate improvement in bilat grip strength by 3# to open jars and containers with modified independence and greater ease. Baseline: Eval: R: 40#, L: 40# Pt. Has difficulty with opening jars and containers with use of left hand. Goal status: INITIAL  5.  Pt will improve left UE strength by 1 mm grade to hold and  hand wash dishes with modified independence.  Baseline: BUE strength 4/5 Goal status: INITIAL   6.  Pt will Improve FOTO score by 2 points or greater to show a clinically relevant change in LUE to impact greater independence with daily ADL and IADL tasks at home and in the community.   Baseline: Eval: TBD 2/2 set-up change required; 05/22/23: 65 Goal status: INITIAL  ASSESSMENT:  CLINICAL IMPRESSION: Pt reports fatigue today and having difficulty sleeping at night.  Overall, pt tolerated therapeutic exercises and activities well this date with focus on gripping, pinching, and sustaining a lumbrical grasp in either hand.  Pt requires PIP blocking to prevent PIP flexion when attempting a lumbrical grasp in the L hand and is unable to resist a slight tug without breaking from position.  In the R hand, pt can sustain this grasp pattern while resisting against a slight tug holding on to paper pad 1/4" thick.  Pt continues to demonstrate extensive weakness in R/L hands, though L weaker than R.  Pt reports difficulty opening containers and snack packages.  Pt. will continue to benefit from OT services to work on improving BUE strength, hand function, and left hand [facility] skills in order to maximize independence with ADLs, and IADL tasks.   PERFORMANCE DEFICITS: in functional skills including ADLs, IADLs, coordination, dexterity, sensation, ROM, strength, Fine motor control, Gross motor control, balance, body mechanics, vision, and UE functional use.   IMPAIRMENTS: are limiting patient from ADLs, IADLs, leisure, and social  participation.   CO-MORBIDITIES: may have co-morbidities  that affects occupational performance. Patient will benefit from skilled OT to address above impairments and improve overall function.  MODIFICATION OR ASSISTANCE TO COMPLETE EVALUATION: Min-Moderate modification of tasks or assist with assess necessary to complete an evaluation.  OT OCCUPATIONAL PROFILE AND HISTORY: Detailed assessment: Review of records and additional review of physical, cognitive, psychosocial history related to current functional performance.  CLINICAL DECISION MAKING: Moderate - several treatment options, min-mod task modification necessary  REHAB POTENTIAL: Good  EVALUATION COMPLEXITY: Moderate    PLAN:  OT FREQUENCY: 2x/week  OT DURATION: 12 weeks  PLANNED INTERVENTIONS: self care/ADL training, therapeutic exercise, therapeutic activity, neuromuscular re-education, manual therapy, moist heat, patient/family education, and DME and/or AE instructions  RECOMMENDED OTHER SERVICES: PT  CONSULTED AND AGREED WITH PLAN OF CARE: Patient  PLAN FOR NEXT SESSION: see above  Danelle Earthly, MS, OTR/L  06/10/2023

## 2023-06-11 ENCOUNTER — Ambulatory Visit: Payer: Medicare Other

## 2023-06-12 ENCOUNTER — Ambulatory Visit: Payer: Medicare Other | Admitting: Occupational Therapy

## 2023-06-12 ENCOUNTER — Ambulatory Visit: Payer: Medicare Other | Admitting: Physical Therapy

## 2023-06-12 DIAGNOSIS — R2681 Unsteadiness on feet: Secondary | ICD-10-CM

## 2023-06-12 DIAGNOSIS — R2689 Other abnormalities of gait and mobility: Secondary | ICD-10-CM

## 2023-06-12 DIAGNOSIS — R269 Unspecified abnormalities of gait and mobility: Secondary | ICD-10-CM

## 2023-06-12 DIAGNOSIS — R262 Difficulty in walking, not elsewhere classified: Secondary | ICD-10-CM

## 2023-06-12 DIAGNOSIS — R278 Other lack of coordination: Secondary | ICD-10-CM

## 2023-06-12 DIAGNOSIS — M6281 Muscle weakness (generalized): Secondary | ICD-10-CM

## 2023-06-12 NOTE — Therapy (Signed)
OUTPATIENT PHYSICAL THERAPY TREATMENT   Patient Name: Laderrius Shelton MRN: [id number] DOB:1970/09/16, 52 y.o., male Today's Date: 06/12/2023   PCP: Dale Hallowell MD REFERRING PROVIDER: Dale [place] MD  END OF SESSION:  PT End of Session - 06/12/23 1055     Visit Number 3    Number of Visits 16    Date for PT Re-Evaluation 07/15/23    Authorization Type Medicare; Medicaid    Authorization Time Period through 07/15/23    Authorization - Visit Number 3    Authorization - Number of Visits 12    Progress Note Due on Visit 60    PT Start Time 1015    PT Stop Time 1058    PT Time Calculation (min) 43 min    Equipment Utilized During Treatment Gait belt    Activity Tolerance Patient tolerated treatment well    Behavior During Therapy WFL for tasks assessed/performed              Past Medical History:  Diagnosis Date   Allergy    Anemia    Bell's palsy    Diabetes mellitus without complication (HCC)    diet controlled   Hypertension    Hypothyroidism    Kidney stones    Pseudotumor cerebri    Stroke [facility])    Past Surgical History:  Procedure Laterality Date   COLONOSCOPY WITH PROPOFOL N/A 03/30/2020   Procedure: COLONOSCOPY WITH PROPOFOL;  Surgeon: Regis Bill, MD;  Location: [facility] ENDOSCOPY;  Service: Endoscopy;  Laterality: N/A;   LOOP RECORDER INSERTION N/A 01/27/2018   Procedure: LOOP RECORDER INSERTION;  Surgeon: Duke Salvia, MD;  Location: [facility] INVASIVE CV LAB;  Service: Cardiovascular;  Laterality: N/A;   LUMBAR PUNCTURE     as child   NO PAST SURGERIES     TEE WITHOUT CARDIOVERSION N/A 01/07/2018   Procedure: TRANSESOPHAGEAL ECHOCARDIOGRAM (TEE);  Surgeon: Antonieta Iba, MD;  Location: [facility] ORS;  Service: Cardiovascular;  Laterality: N/A;   Patient Active Problem List   Diagnosis Date Noted   Non-compliance with renal dialysis (HCC) 05/02/2023   Obesity (BMI 30-39.9) 04/30/2023   Volume overload 04/29/2023   Unsteady gait 10/05/2022    Steal syndrome of dialysis vascular access (HCC) 05/28/2022   Hydronephrosis, left 05/04/2022   Leukocytosis 05/04/2022   Pre-op evaluation 04/24/2022   Chest pain 01/13/2022   Diabetic retinopathy associated with diabetes mellitus due to underlying condition (HCC) 01/13/2022   ESRD on hemodialysis (HCC) 01/13/2022   Deafness in right ear 08/11/2021   Open wound 01/25/2021   History of colon polyps 10/15/2020   Postoperative hemorrhage involving digestive system following digestive system procedure 09/19/2020   Acute cholecystitis without calculus 09/09/2020   Type 2 diabetes mellitus, with long-term current use of insulin (HCC) 09/09/2020   Cryptogenic stroke (HCC) 07/13/2020   History of loop recorder 07/13/2020   History of 2019 novel coronavirus disease (COVID-19) 05/20/2020   Pneumonia due to COVID-19 virus 04/03/2020   Elevated troponin 04/03/2020   Acquired trigger finger 06/08/2019   Lymphedema 06/08/2019   Anemia 04/17/2019   Swelling of both lower extremities 01/10/2019   Facial droop 04/09/2018   Daytime somnolence 03/30/2018   Carotid artery disease (HCC) 02/03/2018   Intracranial vascular stenosis 09/12/2017   Cough 01/20/2017   Bell's palsy 11/10/2016   History of CVA (cerebrovascular accident) 11/10/2016   Benign localized hyperplasia of prostate with urinary obstruction 10/27/2016   History of nephrolithiasis 10/27/2016   TIA (transient ischemic attack) 10/20/2016  Near syncope 06/23/2016   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  Rationale for Evaluation and Treatment: Rehabilitation  SUBJECTIVE:                                                                                                                                                                                              SUBJECTIVE STATEMENT: Pt denies any updates since last visit. Doing well. Happy he was able to get into PT today.   Pt accompanied by: self  PERTINENT HISTORY: Patient returning to PT after hospitalization after being off HD for past 3 months with end stage ESRD. PMH DM, HTN, Anemia, Bells palsy, Hypothyroidism, Kidney stones, stroke and pseudotumour cerebri. PAIN:  Are you having pain? No  PRECAUTIONS: Fall  RED FLAGS: None   WEIGHT BEARING RESTRICTIONS: No  FALLS: Has patient fallen in last 6 months? No  PATIENT GOALS: to get LLE stronger and more steady  OBJECTIVE:   TODAY'S TREATMENT:                                                                                                                              DATE: 06/12/23  TE Nustep level 1 x 2 min, level 3 x 4 min  BUE/LE reciprocal movement   -STS from elevated plinth 21.5" hands free (cues for feet width ( pt had feet spread very far apart) 1x10 -AMB over lower level x939ft  with 2.5# AW donned -STS from elevated plinth 21.5" hands free (cues to reach forward) 1x10  Ambulation with turns intermittently, challenged with speed of turn x 10-12 reps  Retro walking x 2 rounds of 30 ft with speed walking between, 2.5# AW still donned LAQ 2 x 15 ea LE with 2.5#  AW  -step taps, alteranting sides 2x30     PATIENT EDUCATION: Education details: goals, POC,  Person educated: Patient Education method: Explanation, Demonstration, Tactile cues, and Verbal cues Education comprehension: verbalized understanding, returned demonstration, verbal cues required, and tactile cues required  HOME EXERCISE PROGRAM: Give next session   GOALS: Goals reviewed with patient? Yes  SHORT TERM GOALS: Target date: 06/17/2023   Patient will be  independent in home exercise program to improve strength/mobility for better functional independence with ADLs. Baseline: Goal status: INITIAL   LONG TERM GOALS: Target date: 07/15/2023   Patient will increase FOTO score to equal to or greater than   72%  to demonstrate statistically significant improvement in mobility and quality of life.  Baseline: 9/24: 65% Goal status: INITIAL  2.   Patient (< 44 years old) will complete five times sit to stand test in < 10 seconds indicating an increased LE strength and improved balance. Baseline: 9/24: 20.23 seconds no hands, one near LOB Goal status: INITIAL  3.   Patient will increase Berg Balance score by >45/56 to demonstrate decreased fall risk during functional activities.  Baseline: 39 Goal status: INITIAL  4.    Patient will increase six minute walk test distance to >1400 for progression to age norm community ambulator and improve gait ability Baseline: 9/24: 1060  Goal status: INITIAL   ASSESSMENT:  CLINICAL IMPRESSION: Pt continues with LE strength and functional mobility training. Pt has some fatigue but does not require any significant rest between sets of exercise. Pt does have RLE equinus in stance with gait but seems to compensate well for this on even ground. Patient will benefit from skilled physical therapy to improve strength, stability and mobility.   OBJECTIVE IMPAIRMENTS: Abnormal gait, cardiopulmonary status limiting activity, decreased activity tolerance, decreased balance, decreased coordination, decreased mobility, difficulty walking, decreased strength, impaired flexibility, impaired sensation, impaired vision/preception, improper body mechanics, and postural dysfunction.   ACTIVITY LIMITATIONS: carrying, lifting, bending, sitting, standing, squatting, stairs, transfers, bed mobility, dressing, reach over head, hygiene/grooming, locomotion level, and caring for others  PARTICIPATION LIMITATIONS: meal prep, cleaning,  laundry, personal finances, driving, shopping, community activity, occupation, and yard work  PERSONAL FACTORS: Age, Fitness, Past/current experiences, Time since onset of injury/illness/exacerbation, and 3+ comorbidities: DM, HTN, Anemia, Bells palsy, Hypothyroidism, Kidney stones, stroke and pseudotumour cerebri  are also affecting patient's functional outcome.   REHAB POTENTIAL: Good  CLINICAL DECISION MAKING: Evolving/moderate complexity  EVALUATION COMPLEXITY: Moderate  PLAN:  PT FREQUENCY: 2x/week  PT DURATION: 8 weeks  PLANNED INTERVENTIONS: Therapeutic exercises, Therapeutic activity, Neuromuscular re-education, Balance training, Gait training, Patient/Family education, Self Care, Joint mobilization, Stair training, Vestibular training, Canalith repositioning, Visual/preceptual remediation/compensation, Orthotic/Fit training, DME instructions, Dry Needling, Spinal mobilization, Cryotherapy, Moist heat, Splintting, Taping, Traction, Manual therapy, and Re-evaluation  PLAN FOR NEXT SESSION: give HEP; hip flexor strength and ankle strength, balance    Norman Herrlich, PT 06/12/2023, 11:23 AM  11:23 AM, 06/12/23

## 2023-06-12 NOTE — Therapy (Signed)
OUTPATIENT OCCUPATIONAL THERAPY NEURO TREATMENT NOTE  Patient Name: Gregory Crane MRN: [id number] DOB:03-06-1971, 52 y.o., male Today's Date: 06/12/2023   REFERRING PROVIDER: Dale Coppock, MD  END OF SESSION:  OT End of Session - 06/12/23 1219     Visit Number 7    Number of Visits 24    Date for OT Re-Evaluation 08/12/23    OT Start Time 1100    OT Stop Time 1143    OT Time Calculation (min) 43 min    Activity Tolerance Patient tolerated treatment well    Behavior During Therapy [facility] for tasks assessed/performed            Past Medical History:  Diagnosis Date   Allergy    Anemia    Bell's palsy    Diabetes mellitus without complication (HCC)    diet controlled   Hypertension    Hypothyroidism    Kidney stones    Pseudotumor cerebri    Stroke [facility])    Past Surgical History:  Procedure Laterality Date   COLONOSCOPY WITH PROPOFOL N/A 03/30/2020   Procedure: COLONOSCOPY WITH PROPOFOL;  Surgeon: Regis Bill, MD;  Location: [facility] ENDOSCOPY;  Service: Endoscopy;  Laterality: N/A;   LOOP RECORDER INSERTION N/A 01/27/2018   Procedure: LOOP RECORDER INSERTION;  Surgeon: Duke Salvia, MD;  Location: [facility] INVASIVE CV LAB;  Service: Cardiovascular;  Laterality: N/A;   LUMBAR PUNCTURE     as child   NO PAST SURGERIES     TEE WITHOUT CARDIOVERSION N/A 01/07/2018   Procedure: TRANSESOPHAGEAL ECHOCARDIOGRAM (TEE);  Surgeon: Antonieta Iba, MD;  Location: [facility] ORS;  Service: Cardiovascular;  Laterality: N/A;   Patient Active Problem List   Diagnosis Date Noted   Non-compliance with renal dialysis (HCC) 05/02/2023   Obesity (BMI 30-39.9) 04/30/2023   Volume overload 04/29/2023   Unsteady gait 10/05/2022   Steal syndrome of dialysis vascular access (HCC) 05/28/2022   Hydronephrosis, left 05/04/2022   Leukocytosis 05/04/2022   Pre-op evaluation 04/24/2022   Chest pain 01/13/2022   Diabetic retinopathy associated with diabetes mellitus due to underlying condition  (HCC) 01/13/2022   ESRD on hemodialysis (HCC) 01/13/2022   Deafness in right ear 08/11/2021   Open wound 01/25/2021   History of colon polyps 10/15/2020   Postoperative hemorrhage involving digestive system following digestive system procedure 09/19/2020   Acute cholecystitis without calculus 09/09/2020   Type 2 diabetes mellitus, with long-term current use of insulin (HCC) 09/09/2020   Cryptogenic stroke (HCC) 07/13/2020   History of loop recorder 07/13/2020   History of 2019 novel coronavirus disease (COVID-19) 05/20/2020   Pneumonia due to COVID-19 virus 04/03/2020   Elevated troponin 04/03/2020   Acquired trigger finger 06/08/2019   Lymphedema 06/08/2019   Anemia 04/17/2019   Swelling of both lower extremities 01/10/2019   Facial droop 04/09/2018   Daytime somnolence 03/30/2018   Carotid artery disease (HCC) 02/03/2018   Intracranial vascular stenosis 09/12/2017   Cough 01/20/2017   Bell's palsy 11/10/2016   History of CVA (cerebrovascular accident) 11/10/2016   Benign localized hyperplasia of prostate with urinary obstruction 10/27/2016   History of nephrolithiasis 10/27/2016   TIA (transient ischemic attack) 10/20/2016   Near syncope 06/23/2016   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 Pt accompanied by: self  PERTINENT HISTORY: Pt. was hospitalized from 9/02-9/12/2022 for LE edema. Pt. was previously  being followed by outpatient  OT services for weakness following a CVA. Pt. Restarted Hemodialysis. PMHx includes: ESRD on Hemodialysis, CVA, Hypothyroidism, Anemia, Type II DM, Volume overload, obesity, and noncompliant renal dialysis.  PRECAUTIONS: None  WEIGHT BEARING RESTRICTIONS: No  PAIN:  Are you having pain? 0/10  FALLS: Has patient fallen in last 6 months? No  LIVING ENVIRONMENT: Lives with:  Step daughter resides with the Pt.  Lives in: House/apartment Stairs:No external steps, Yes: Internal: 15 steps; on right going up Has following equipment at home: Walker - 2 wheeled  PLOF: Independent  PATIENT GOALS: To improve bilateral FMC primarily the left  OBJECTIVE:   HAND DOMINANCE: Right  ADLs: Transfers/ambulation related to ADLs: Eating: Independent, inefficient with cutting food Grooming: Independent UB Dressing: Independent, buttoning is challenging LB Dressing: Independent, difficulty with tying shoes Toileting: Independent Bathing: Independent Tub Shower transfers: Independent Equipment: none  IADLs: Shopping: Independent; difficulty with cash interaction Light housekeeping: Independent, slower than usual. Meal Prep: Independent, Requires increased time Community mobility: Drives Medication management: Difficulty setting handling small pills. Financial management: No changes Handwriting: 90% legible  MOBILITY STATUS: Independent  ACTIVITY TOLERANCE: Activity tolerance: Fatigues after 10-20 min.  FUNCTIONAL OUTCOME MEASURES: FOTO: TBD 2/2 set-up change required 05/22/23: FOTO: 65; predicted 65  UPPER EXTREMITY ROM:    Active ROM Right Eval WFL Left Eval [facility]  Shoulder flexion    Shoulder abduction    Shoulder adduction    Shoulder extension    Shoulder internal rotation    Shoulder external rotation    Elbow flexion    Elbow extension    Wrist flexion    Wrist extension    Wrist ulnar deviation    Wrist radial deviation    Wrist  pronation    Wrist supination    (Blank rows = not tested)  UPPER EXTREMITY MMT:     MMT Right eval Left eval  Shoulder flexion 4/5 4/5  Shoulder abduction 4/5 4/5  Shoulder adduction    Shoulder extension    Shoulder internal rotation    Shoulder external rotation    Middle trapezius    Lower trapezius    Elbow flexion 4/5 4/5  Elbow extension 4/5 4/5  Wrist flexion 4/5 4/5  Wrist extension 4/5 4/5  Wrist ulnar deviation    Wrist radial deviation    Wrist pronation    Wrist supination    (Blank rows = not tested)  HAND FUNCTION: Grip strength: Right: 40 lbs; Left: 40 lbs, Lateral pinch: Right: 9 lbs, Left: 8 lbs, and 3 point pinch: Right: 6 lbs, Left: 5 lbs  COORDINATION:  9 Hole Peg test: Right: 32 sec; Left: 1 min & 8 sec. for *8* pegs only with multiple dropped pegs.  SENSATION:  Positive numbness increased in the Left 4th &5th digits, with mild numbness in all digits of the left hand  EDEMA: left   MUSCLE TONE: RUE: Hypotonic  COGNITION: Overall cognitive status: Within functional limits for tasks assessed  VISION: Subjective report: light sensitivity, Retina specialist is following Pt.  Baseline vision: Wears glasses for reading only Visual history: macular degeneration Per  Pt. Report: affects from years of Diabetes.  VISION ASSESSMENT: Continue to assess in functional context  PERCEPTION: WFL  PRAXIS: impaired  TODAY'S TREATMENT:                                                                                                                              DATE: 06/12/2023   Therapeutic Exercise:   Pt. Completed reps of MP flex/ext with simultaneous PIP ext, and active assisted digit abd/add for the left hand.  Pt requires PIP blocking to prevent PIP flexion. Pt. performed gross gripping with a gross grip strengthener. Pt. worked on sustaining grip while grasping pegs and reaching at various heights. The Gripper was set to  17.9# of grip strength  resistance.   Neuromuscular re-eduction:   Pt. worked on [facility] skills grasping 1" sticks. Pt. worked on storing the objects in the palm, and translatory skills moving the items from the palm of the hand to the tip of the 2nd digit, and thumb, however had difficulty as the small sticks consistently get stuck in the Volar MP folds of the hand. Pt. worked on removing the pegs using bilateral alternating hand patterns.   PATIENT EDUCATION: Education details: intrinsic hand strengthening exercises bilat hands Person educated: Patient Education method: Medical illustrator Education comprehension: verbalized understanding and returned demonstration  HOME EXERCISE PROGRAM: Theraputty  GOALS: Goals reviewed with patient? Yes  SHORT TERM GOALS: Target date:  07/01/2023      Pt will demonstrate ability to hold and utilize utensils for cutting meat with modified independence. Baseline:Eval: Pt. Has difficulty with cutting, stabilizing item with fork for cutting Goal status: INITIAL    LONG TERM GOALS: Target date:  08/12/2023  Pt will demonstrate HEP with modified independence.   Baseline: Eval: HEPs to be modified, and upgraded as needed since recent hospitalization. Goal status: INITIAL   2.  Pt will improve left UE coordination by 10 secs on 9 hole peg test to demonstrate shoe tying with modified independence.  Baseline: R: 32 sec., L: 1 min. & 8 sec. for *8 pegs only.Pt. hand difficulty with efficiently tying shoes. Goal status:  INITIAL   3.  Pt will demonstrate improved coordination in left hand to demonstrate buttoning buttons on clothing with modified independence.  Baseline: Pt. Has difficulty manipulating buttons. Goal status: INITIAL   4.  Pt will demonstrate improvement in bilat grip strength by 3# to open jars and containers with modified independence and greater ease. Baseline: Eval: R: 40#, L: 40# Pt. Has difficulty with opening jars and containers with use of left  hand. Goal status: INITIAL  5.  Pt will improve left UE strength by 1 mm grade to hold and hand wash dishes with modified independence.  Baseline: BUE strength 4/5 Goal status: INITIAL   6.  Pt will Improve FOTO score by 2 points or greater to show a clinically relevant change in LUE to impact greater independence with daily ADL and  IADL tasks at home and in the community.   Baseline: Eval: TBD 2/2 set-up change required; 05/22/23: 65 Goal status: INITIAL  ASSESSMENT:  CLINICAL IMPRESSION:   Pt. continues to present with intrinsic hand weakness with difficulty performing digit MP Abduction, and adduction. Pt. presents with limited gross grip strength. Pt continues to demonstrate extensive weakness in R/L hands, though L weaker than R. Pt. presents with difficulty with translatory movements of the hand, moving objects through his hand. Pt reports difficulty opening containers and snack packages.  Pt. Continues to benefit from OT services to work on improving BUE strength, hand function, and left hand [facility] skills in order to maximize independence with ADLs, and IADL tasks.   PERFORMANCE DEFICITS: in functional skills including ADLs, IADLs, coordination, dexterity, sensation, ROM, strength, Fine motor control, Gross motor control, balance, body mechanics, vision, and UE functional use.   IMPAIRMENTS: are limiting patient from ADLs, IADLs, leisure, and social participation.   CO-MORBIDITIES: may have co-morbidities  that affects occupational performance. Patient will benefit from skilled OT to address above impairments and improve overall function.  MODIFICATION OR ASSISTANCE TO COMPLETE EVALUATION: Min-Moderate modification of tasks or assist with assess necessary to complete an evaluation.  OT OCCUPATIONAL PROFILE AND HISTORY: Detailed assessment: Review of records and additional review of physical, cognitive, psychosocial history related to current functional performance.  CLINICAL DECISION  MAKING: Moderate - several treatment options, min-mod task modification necessary  REHAB POTENTIAL: Good  EVALUATION COMPLEXITY: Moderate    PLAN:  OT FREQUENCY: 2x/week  OT DURATION: 12 weeks  PLANNED INTERVENTIONS: self care/ADL training, therapeutic exercise, therapeutic activity, neuromuscular re-education, manual therapy, moist heat, patient/family education, and DME and/or AE instructions  RECOMMENDED OTHER SERVICES: PT  CONSULTED AND AGREED WITH PLAN OF CARE: Patient  PLAN FOR NEXT SESSION: see above  Olegario Messier, MS, OTR/L   06/12/2023

## 2023-06-15 ENCOUNTER — Encounter: Payer: Self-pay | Admitting: Internal Medicine

## 2023-06-15 DIAGNOSIS — W19XXXA Unspecified fall, initial encounter: Secondary | ICD-10-CM | POA: Insufficient documentation

## 2023-06-15 NOTE — Assessment & Plan Note (Signed)
Blood pressure has been doing better.  Continue hydralazine and amlodipine.  Follow pressures.  Follow metabolic panel.

## 2023-06-15 NOTE — Assessment & Plan Note (Signed)
Continue lyrica.  Follow.  

## 2023-06-15 NOTE — Assessment & Plan Note (Signed)
Labs have been followed through dialysis.  Obtain lab results.

## 2023-06-15 NOTE — Assessment & Plan Note (Signed)
Continue hemodialysis.  Followed by nephrology.  Follows labs through dialysis. Had labs this am. Obtain results

## 2023-06-15 NOTE — Assessment & Plan Note (Signed)
Low cholesterol diet and exercise.  Follow lipid panel and liver function tests.  

## 2023-06-15 NOTE — Assessment & Plan Note (Signed)
On thyroid replacement.  Follow tsh. Instructed on importance of taking regularly.

## 2023-06-15 NOTE — Assessment & Plan Note (Signed)
Sees endocrinology. Low carb diet.  Dexcom.  Follow.  Most recent A1c 5.9. Continue f/u with ophthalmology.

## 2023-06-15 NOTE — Assessment & Plan Note (Signed)
Last carotid ultrasound - <50% bilaterally.  Needs to take statin and keep blood pressure under control.

## 2023-06-15 NOTE — Assessment & Plan Note (Signed)
Recent fall as outlined.  Exercise/PT discussed.  Follow.

## 2023-06-15 NOTE — Assessment & Plan Note (Signed)
Low carb diet and exercise.  No change in medication.  Seeing endocrinology.  Last A1c last check -04/28/23 - 5.9. . Follow met b and a1c. Off insulin.  Am sugars averaging 105-115.

## 2023-06-15 NOTE — Assessment & Plan Note (Signed)
Has seen [facility] - swelling was improved from hospitalization. Avoid increased sodium intake.  Elevate legs.

## 2023-06-15 NOTE — Assessment & Plan Note (Signed)
Continue aspirin and lipitor.  Continue blood pressure control.

## 2023-06-16 ENCOUNTER — Ambulatory Visit: Payer: Medicare Other | Admitting: Occupational Therapy

## 2023-06-16 ENCOUNTER — Ambulatory Visit: Payer: Medicare Other

## 2023-06-17 ENCOUNTER — Ambulatory Visit: Payer: Medicare Other | Admitting: Occupational Therapy

## 2023-06-17 ENCOUNTER — Ambulatory Visit: Payer: Medicare Other

## 2023-06-17 DIAGNOSIS — R269 Unspecified abnormalities of gait and mobility: Secondary | ICD-10-CM

## 2023-06-17 DIAGNOSIS — R278 Other lack of coordination: Secondary | ICD-10-CM

## 2023-06-17 DIAGNOSIS — M6281 Muscle weakness (generalized): Secondary | ICD-10-CM

## 2023-06-17 DIAGNOSIS — R262 Difficulty in walking, not elsewhere classified: Secondary | ICD-10-CM

## 2023-06-17 DIAGNOSIS — R2681 Unsteadiness on feet: Secondary | ICD-10-CM

## 2023-06-17 DIAGNOSIS — R2689 Other abnormalities of gait and mobility: Secondary | ICD-10-CM

## 2023-06-17 NOTE — Therapy (Signed)
OUTPATIENT PHYSICAL THERAPY TREATMENT   Patient Name: Gregory Crane MRN: [id number] DOB:07-Oct-1970, 52 y.o., male Today's Date: 06/18/2023   PCP: Dale C[place] MD REFERRING PROVIDER: Dale [place] MD  END OF SESSION:  PT End of Session - 06/17/23 1446     Visit Number 4    Number of Visits 16    Date for PT Re-Evaluation 07/15/23    Authorization Type Medicare; Medicaid    Authorization Time Period through 07/15/23    Authorization - Number of Visits 12    Progress Note Due on Visit 60    PT Start Time 1445    PT Stop Time 1527    PT Time Calculation (min) 42 min    Equipment Utilized During Treatment Gait belt    Activity Tolerance Patient tolerated treatment well    Behavior During Therapy WFL for tasks assessed/performed              Past Medical History:  Diagnosis Date   Allergy    Anemia    Bell's palsy    Diabetes mellitus without complication (HCC)    diet controlled   Hypertension    Hypothyroidism    Kidney stones    Pseudotumor cerebri    Stroke [facility])    Past Surgical History:  Procedure Laterality Date   COLONOSCOPY WITH PROPOFOL N/A 03/30/2020   Procedure: COLONOSCOPY WITH PROPOFOL;  Surgeon: Regis Bill, MD;  Location: [facility] ENDOSCOPY;  Service: Endoscopy;  Laterality: N/A;   LOOP RECORDER INSERTION N/A 01/27/2018   Procedure: LOOP RECORDER INSERTION;  Surgeon: Duke Salvia, MD;  Location: [facility] INVASIVE CV LAB;  Service: Cardiovascular;  Laterality: N/A;   LUMBAR PUNCTURE     as child   NO PAST SURGERIES     TEE WITHOUT CARDIOVERSION N/A 01/07/2018   Procedure: TRANSESOPHAGEAL ECHOCARDIOGRAM (TEE);  Surgeon: Antonieta Iba, MD;  Location: [facility] ORS;  Service: Cardiovascular;  Laterality: N/A;   Patient Active Problem List   Diagnosis Date Noted   Fall 06/15/2023   Non-compliance with renal dialysis (HCC) 05/02/2023   Obesity (BMI 30-39.9) 04/30/2023   Volume overload 04/29/2023   Unsteady gait 10/05/2022   Steal syndrome  of dialysis vascular access (HCC) 05/28/2022   Hydronephrosis, left 05/04/2022   Leukocytosis 05/04/2022   Pre-op evaluation 04/24/2022   Chest pain 01/13/2022   Diabetic retinopathy associated with diabetes mellitus due to underlying condition (HCC) 01/13/2022   ESRD on hemodialysis (HCC) 01/13/2022   Deafness in right ear 08/11/2021   Open wound 01/25/2021   History of colon polyps 10/15/2020   Postoperative hemorrhage involving digestive system following digestive system procedure 09/19/2020   Acute cholecystitis without calculus 09/09/2020   Type 2 diabetes mellitus, with long-term current use of insulin (HCC) 09/09/2020   Cryptogenic stroke (HCC) 07/13/2020   History of loop recorder 07/13/2020   History of 2019 novel coronavirus disease (COVID-19) 05/20/2020   Pneumonia due to COVID-19 virus 04/03/2020   Elevated troponin 04/03/2020   Acquired trigger finger 06/08/2019   Lymphedema 06/08/2019   Anemia 04/17/2019   Swelling of both lower extremities 01/10/2019   Facial droop 04/09/2018   Daytime somnolence 03/30/2018   Carotid artery disease (HCC) 02/03/2018   Intracranial vascular stenosis 09/12/2017   Cough 01/20/2017   Bell's palsy 11/10/2016   History of CVA (cerebrovascular accident) 11/10/2016   Benign localized hyperplasia of prostate with urinary obstruction 10/27/2016   History of nephrolithiasis 10/27/2016   TIA (transient ischemic attack) 10/20/2016   Near syncope 06/23/2016  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   Neuropathy 08/06/2015   Health care maintenance 08/06/2015   Essential hypertension 08/06/2015   Heme positive stool 10/10/2013   Hypothyroidism 10/10/2013   Microalbuminuria 10/10/2013   Hyperlipidemia 10/10/2013   B12 deficiency 10/10/2013   Environmental allergies 07/04/2013    ONSET DATE: 3 years ago  REFERRING DIAG: CVA  THERAPY DIAG:  Muscle weakness (generalized)  Unsteadiness on feet  Abnormality of gait and mobility  Other  abnormalities of gait and mobility  Difficulty in walking, not elsewhere classified  Other lack of coordination  Rationale for Evaluation and Treatment: Rehabilitation  SUBJECTIVE:                                                                                                                                                                                             SUBJECTIVE STATEMENT:  Patient reports doing okay without any new issues. States neuropathy in his feet make him unsteady and his tendency is to fall backward.   Pt accompanied by: self  PERTINENT HISTORY: Patient returning to PT after hospitalization after being off HD for past 3 months with end stage ESRD. PMH DM, HTN, Anemia, Bells palsy, Hypothyroidism, Kidney stones, stroke and pseudotumour cerebri. PAIN:  Are you having pain? No  PRECAUTIONS: Fall  RED FLAGS: None   WEIGHT BEARING RESTRICTIONS: No  FALLS: Has patient fallen in last 6 months? No  PATIENT GOALS: to get LLE stronger and more steady  OBJECTIVE:   TODAY'S TREATMENT:                                                                                                                              DATE: 06/18/23   NMR:   Static standing with feet apart- eyes open x 30 sec x 3 (min A/P sway)  Static standing with feet apart - eyes closed x 30 sec x 3 (increased sway yet able to use mostly ankle righting reactions)  Static standing with feet apart - eyes open with horizontal then vertical head turns- CGA with occasional LOB- posteriorly Static standing with feet narrowed -  Eyes open x 30 sec x 3  Static standing with feet narrowed - Eyes closed x 30 sec x 3 (marked increased in sway)   Sit to stand without UE support x 12 reps (VC for forward trunk lean)  HEP (instructed as below and issued handout today)     PATIENT EDUCATION: Education details: goals, POC,  Person educated: Patient Education method: Explanation, Demonstration, Tactile cues, and  Verbal cues Education comprehension: verbalized understanding, returned demonstration, verbal cues required, and tactile cues required  HOME EXERCISE PROGRAM: Access Code: [facility] URL: [email or web address] Date: 06/17/2023 Prepared by: Maureen Ralphs  Exercises - Standing Balance in Corner with Eyes Closed  - 1 x daily - 2-3 x weekly - 3 sets - up to 30 sec hold - Standing Balance in Corner  - 1 x daily - 2-3 x weekly - 3 sets - 30 sec hold - Corner Balance Feet Apart: Eyes Open With Head Turns  - 1 x daily - 2-3 x weekly - 3 sets - 10 reps - Sit to Stand with Arms Crossed  - 1 x daily - 3 x weekly - 3 sets - 10 reps GOALS: Goals reviewed with patient? Yes  SHORT TERM GOALS: Target date: 06/17/2023   Patient will be independent in home exercise program to improve strength/mobility for better functional independence with ADLs. Baseline: Goal status: INITIAL   LONG TERM GOALS: Target date: 07/15/2023   Patient will increase FOTO score to equal to or greater than   72%  to demonstrate statistically significant improvement in mobility and quality of life.  Baseline: 9/24: 65% Goal status: INITIAL  2.   Patient (< 27 years old) will complete five times sit to stand test in < 10 seconds indicating an increased LE strength and improved balance. Baseline: 9/24: 20.23 seconds no hands, one near LOB Goal status: INITIAL  3.   Patient will increase Berg Balance score by >45/56 to demonstrate decreased fall risk during functional activities.  Baseline: 39 Goal status: INITIAL  4.    Patient will increase six minute walk test distance to >1400 for progression to age norm community ambulator and improve gait ability Baseline: 9/24: 1060  Goal status: INITIAL   ASSESSMENT:  CLINICAL IMPRESSION: Paitent denied performing a HEP at beginning of session. So focused on instructing in some basic standing balance to challenge both sensory and motor components of balance.  He responded well- instructed him to perform in corner of home with walker in front for safety. He was able to verbalize and demonstrate good understanding today. Patient will benefit from skilled physical therapy to improve strength, stability and mobility.   OBJECTIVE IMPAIRMENTS: Abnormal gait, cardiopulmonary status limiting activity, decreased activity tolerance, decreased balance, decreased coordination, decreased mobility, difficulty walking, decreased strength, impaired flexibility, impaired sensation, impaired vision/preception, improper body mechanics, and postural dysfunction.   ACTIVITY LIMITATIONS: carrying, lifting, bending, sitting, standing, squatting, stairs, transfers, bed mobility, dressing, reach over head, hygiene/grooming, locomotion level, and caring for others  PARTICIPATION LIMITATIONS: meal prep, cleaning, laundry, personal finances, driving, shopping, community activity, occupation, and yard work  PERSONAL FACTORS: Age, Fitness, Past/current experiences, Time since onset of injury/illness/exacerbation, and 3+ comorbidities: DM, HTN, Anemia, Bells palsy, Hypothyroidism, Kidney stones, stroke and pseudotumour cerebri  are also affecting patient's functional outcome.   REHAB POTENTIAL: Good  CLINICAL DECISION MAKING: Evolving/moderate complexity  EVALUATION COMPLEXITY: Moderate  PLAN:  PT FREQUENCY: 2x/week  PT DURATION: 8 weeks  PLANNED INTERVENTIONS: Therapeutic exercises, Therapeutic activity, Neuromuscular re-education, Balance training, Gait  training, Patient/Family education, Self Care, Joint mobilization, Stair training, Vestibular training, Canalith repositioning, Visual/preceptual remediation/compensation, Orthotic/Fit training, DME instructions, Dry Needling, Spinal mobilization, Cryotherapy, Moist heat, Splintting, Taping, Traction, Manual therapy, and Re-evaluation  PLAN FOR NEXT SESSION:  hip flexor strength and ankle strength, balance    Lenda Kelp, PT 06/18/2023, 5:35 PM  5:35 PM, 06/18/23

## 2023-06-17 NOTE — Therapy (Addendum)
OUTPATIENT OCCUPATIONAL THERAPY NEURO TREATMENT NOTE  Patient Name: Gregory Crane MRN: [id number] DOB:1971/02/18, 52 y.o., male Today's Date: 06/17/2023   REFERRING PROVIDER: Dale Anadarko, MD  END OF SESSION:  OT End of Session - 06/17/23 1404     Visit Number 8    Date for OT Re-Evaluation 08/12/23    OT Start Time 1400    OT Stop Time 1443    OT Time Calculation (min) 43 min    Activity Tolerance Patient tolerated treatment well    Behavior During Therapy [facility] for tasks assessed/performed            Past Medical History:  Diagnosis Date   Allergy    Anemia    Bell's palsy    Diabetes mellitus without complication (HCC)    diet controlled   Hypertension    Hypothyroidism    Kidney stones    Pseudotumor cerebri    Stroke [facility])    Past Surgical History:  Procedure Laterality Date   COLONOSCOPY WITH PROPOFOL N/A 03/30/2020   Procedure: COLONOSCOPY WITH PROPOFOL;  Surgeon: Regis Bill, MD;  Location: [facility] ENDOSCOPY;  Service: Endoscopy;  Laterality: N/A;   LOOP RECORDER INSERTION N/A 01/27/2018   Procedure: LOOP RECORDER INSERTION;  Surgeon: Duke Salvia, MD;  Location: [facility] INVASIVE CV LAB;  Service: Cardiovascular;  Laterality: N/A;   LUMBAR PUNCTURE     as child   NO PAST SURGERIES     TEE WITHOUT CARDIOVERSION N/A 01/07/2018   Procedure: TRANSESOPHAGEAL ECHOCARDIOGRAM (TEE);  Surgeon: Antonieta Iba, MD;  Location: [facility] ORS;  Service: Cardiovascular;  Laterality: N/A;   Patient Active Problem List   Diagnosis Date Noted   Fall 06/15/2023   Non-compliance with renal dialysis (HCC) 05/02/2023   Obesity (BMI 30-39.9) 04/30/2023   Volume overload 04/29/2023   Unsteady gait 10/05/2022   Steal syndrome of dialysis vascular access (HCC) 05/28/2022   Hydronephrosis, left 05/04/2022   Leukocytosis 05/04/2022   Pre-op evaluation 04/24/2022   Chest pain 01/13/2022   Diabetic retinopathy associated with diabetes mellitus due to underlying condition  (HCC) 01/13/2022   ESRD on hemodialysis (HCC) 01/13/2022   Deafness in right ear 08/11/2021   Open wound 01/25/2021   History of colon polyps 10/15/2020   Postoperative hemorrhage involving digestive system following digestive system procedure 09/19/2020   Acute cholecystitis without calculus 09/09/2020   Type 2 diabetes mellitus, with long-term current use of insulin (HCC) 09/09/2020   Cryptogenic stroke (HCC) 07/13/2020   History of loop recorder 07/13/2020   History of 2019 novel coronavirus disease (COVID-19) 05/20/2020   Pneumonia due to COVID-19 virus 04/03/2020   Elevated troponin 04/03/2020   Acquired trigger finger 06/08/2019   Lymphedema 06/08/2019   Anemia 04/17/2019   Swelling of both lower extremities 01/10/2019   Facial droop 04/09/2018   Daytime somnolence 03/30/2018   Carotid artery disease (HCC) 02/03/2018   Intracranial vascular stenosis 09/12/2017   Cough 01/20/2017   Bell's palsy 11/10/2016   History of CVA (cerebrovascular accident) 11/10/2016   Benign localized hyperplasia of prostate with urinary obstruction 10/27/2016   History of nephrolithiasis 10/27/2016   TIA (transient ischemic attack) 10/20/2016   Near syncope 06/23/2016   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   Neuropathy 08/06/2015   Health care maintenance 08/06/2015   Essential hypertension 08/06/2015   Heme positive stool 10/10/2013   Hypothyroidism 10/10/2013   Microalbuminuria 10/10/2013   Hyperlipidemia 10/10/2013   B12 deficiency 10/10/2013   Environmental allergies 07/04/2013   ONSET DATE: 04/28/2023  REFERRING DIAG: Swelling BLEs, ESRD, CVA  THERAPY DIAG:  Muscle weakness (generalized)  Rationale for Evaluation and Treatment: Rehabilitation  SUBJECTIVE:  SUBJECTIVE STATEMENT: 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 was hospitalized from 9/02-9/12/2022 for LE edema. Pt. was previously being followed by outpatient  OT  services for weakness following a CVA. Pt. Restarted Hemodialysis. PMHx includes: ESRD on Hemodialysis, CVA, Hypothyroidism, Anemia, Type II DM, Volume overload, obesity, and noncompliant renal dialysis.  PRECAUTIONS: None  WEIGHT BEARING RESTRICTIONS: No  PAIN:  Are you having pain? 0/10  FALLS: Has patient fallen in last 6 months? No  LIVING ENVIRONMENT: Lives with:  Step daughter resides with the Pt.  Lives in: House/apartment Stairs:No external steps, Yes: Internal: 15 steps; on right going up Has following equipment at home: Walker - 2 wheeled  PLOF: Independent  PATIENT GOALS: To improve bilateral FMC primarily the left  OBJECTIVE:   HAND DOMINANCE: Right  ADLs: Transfers/ambulation related to ADLs: Eating: Independent, inefficient with cutting food Grooming: Independent UB Dressing: Independent, buttoning is challenging LB Dressing: Independent, difficulty with tying shoes Toileting: Independent Bathing: Independent Tub Shower transfers: Independent Equipment: none  IADLs: Shopping: Independent; difficulty with cash interaction Light housekeeping: Independent, slower than usual. Meal Prep: Independent, Requires increased time Community mobility: Drives Medication management: Difficulty setting handling small pills. Financial management: No changes Handwriting: 90% legible  MOBILITY STATUS: Independent  ACTIVITY TOLERANCE: Activity tolerance: Fatigues after 10-20 min.  FUNCTIONAL OUTCOME MEASURES: FOTO: TBD 2/2 set-up change required 05/22/23: FOTO: 65; predicted 65  UPPER EXTREMITY ROM:    Active ROM Right Eval WFL Left Eval [facility]  Shoulder flexion    Shoulder abduction    Shoulder adduction    Shoulder extension    Shoulder internal rotation    Shoulder external rotation    Elbow flexion    Elbow extension    Wrist flexion    Wrist extension    Wrist ulnar deviation    Wrist radial deviation    Wrist pronation    Wrist supination     (Blank rows = not tested)  UPPER EXTREMITY MMT:     MMT Right eval Left eval  Shoulder flexion 4/5 4/5  Shoulder abduction 4/5 4/5  Shoulder adduction    Shoulder extension    Shoulder internal rotation    Shoulder external rotation    Middle trapezius    Lower trapezius    Elbow flexion 4/5 4/5  Elbow extension 4/5 4/5  Wrist flexion 4/5 4/5  Wrist extension 4/5 4/5  Wrist ulnar deviation    Wrist radial deviation    Wrist pronation    Wrist supination    (Blank rows = not tested)  HAND FUNCTION: Grip strength: Right: 40 lbs; Left: 40 lbs, Lateral pinch: Right: 9 lbs, Left: 8 lbs, and 3 point pinch: Right: 6 lbs, Left: 5 lbs  COORDINATION:  9 Hole Peg test: Right: 32 sec; Left: 1 min & 8 sec. for *8* pegs only with multiple dropped pegs.  SENSATION:  Positive numbness increased in the Left 4th &5th digits, with mild numbness in all digits of the left hand  EDEMA: left   MUSCLE TONE: RUE: Hypotonic  COGNITION: Overall cognitive status: Within functional limits for tasks assessed  VISION: Subjective report: light sensitivity, Retina specialist is following Pt.  Baseline vision: Wears glasses for reading only Visual history: macular degeneration Per Pt. Report: affects from years of Diabetes.  VISION ASSESSMENT: Continue  to assess in functional context  PERCEPTION: WFL  PRAXIS: impaired  TODAY'S TREATMENT:                                                                                                                              DATE: 06/17/2023   Therapeutic Exercise:   Pt. Completed reps of MP flex/ext with simultaneous PIP ext, and active assisted digit abd/add for the left hand.  Pt requires PIP blocking to prevent PIP flexion. Pt. performed left gross gripping with a gross grip strengthener. Pt. worked on sustaining grip while grasping pegs and reaching at various heights. The Gripper was set to 17.9# of grip strength resistance x's 3  trials.  Neuromuscular re-eduction:   Pt. performed resistive EZ Board exercises for forearm supination/pronation, wrist flexion/extension using gross grasp, and lateral pinch (key) grasp. Pt. performed resistive EZ Board exercises angled in several planes to promote shoulder flexion, abduction, and wrist flexion, and extension while performing resistive wrist flexion and extension with a gross grip. Emphasis was places on grasping patterns on the attachments.   PATIENT EDUCATION: Education details: intrinsic hand strengthening exercises bilat hands Person educated: Patient Education method: Medical illustrator Education comprehension: verbalized understanding and returned demonstration  HOME EXERCISE PROGRAM: Theraputty  GOALS: Goals reviewed with patient? Yes  SHORT TERM GOALS: Target date:  07/01/2023      Pt will demonstrate ability to hold and utilize utensils for cutting meat with modified independence. Baseline:Eval: Pt. Has difficulty with cutting, stabilizing item with fork for cutting Goal status: INITIAL    LONG TERM GOALS: Target date:  08/12/2023  Pt will demonstrate HEP with modified independence.   Baseline: Eval: HEPs to be modified, and upgraded as needed since recent hospitalization. Goal status: INITIAL   2.  Pt will improve left UE coordination by 10 secs on 9 hole peg test to demonstrate shoe tying with modified independence.  Baseline: R: 32 sec., L: 1 min. & 8 sec. for *8 pegs only.Pt. hand difficulty with efficiently tying shoes. Goal status:  INITIAL   3.  Pt will demonstrate improved coordination in left hand to demonstrate buttoning buttons on clothing with modified independence.  Baseline: Pt. Has difficulty manipulating buttons. Goal status: INITIAL   4.  Pt will demonstrate improvement in bilat grip strength by 3# to open jars and containers with modified independence and greater ease. Baseline: Eval: R: 40#, L: 40# Pt. Has difficulty  with opening jars and containers with use of left hand. Goal status: INITIAL  5.  Pt will improve left UE strength by 1 mm grade to hold and hand wash dishes with modified independence.  Baseline: BUE strength 4/5 Goal status: INITIAL   6.  Pt will Improve FOTO score by 2 points or greater to show a clinically relevant change in LUE to impact greater independence with daily ADL and IADL tasks at home and in the community.   Baseline: Eval: TBD 2/2 set-up change required;  05/22/23: 65 Goal status: INITIAL  ASSESSMENT:  CLINICAL IMPRESSION:  Pt. continues to present with intrinsic hand weakness with difficulty performing digit MP Abduction, and adduction. Pt. presents with limited gross grip strength. Pt continues to demonstrate extensive weakness in R/L hands, though Left weaker than Right. Pt. Was able to complete 2 trials of gross gripping with the left hand. Pt. required cues for hand position on the EZ Board attachments. Pt reports difficulty opening containers and snack packages.  Pt. continues to benefit from OT services to work on improving BUE strength, hand function, and left hand [facility] skills in order to maximize independence with ADLs, and IADL tasks.   PERFORMANCE DEFICITS: in functional skills including ADLs, IADLs, coordination, dexterity, sensation, ROM, strength, Fine motor control, Gross motor control, balance, body mechanics, vision, and UE functional use.   IMPAIRMENTS: are limiting patient from ADLs, IADLs, leisure, and social participation.   CO-MORBIDITIES: may have co-morbidities  that affects occupational performance. Patient will benefit from skilled OT to address above impairments and improve overall function.  MODIFICATION OR ASSISTANCE TO COMPLETE EVALUATION: Min-Moderate modification of tasks or assist with assess necessary to complete an evaluation.  OT OCCUPATIONAL PROFILE AND HISTORY: Detailed assessment: Review of records and additional review of physical,  cognitive, psychosocial history related to current functional performance.  CLINICAL DECISION MAKING: Moderate - several treatment options, min-mod task modification necessary  REHAB POTENTIAL: Good  EVALUATION COMPLEXITY: Moderate    PLAN:  OT FREQUENCY: 2x/week  OT DURATION: 12 weeks  PLANNED INTERVENTIONS: self care/ADL training, therapeutic exercise, therapeutic activity, neuromuscular re-education, manual therapy, moist heat, patient/family education, and DME and/or AE instructions  RECOMMENDED OTHER SERVICES: PT  CONSULTED AND AGREED WITH PLAN OF CARE: Patient  PLAN FOR NEXT SESSION: see above  Olegario Messier, MS, OTR/L   06/17/2023

## 2023-06-18 ENCOUNTER — Ambulatory Visit: Payer: Medicare Other

## 2023-06-19 ENCOUNTER — Encounter: Payer: Self-pay | Admitting: Emergency Medicine

## 2023-06-19 ENCOUNTER — Ambulatory Visit: Payer: Medicare Other | Admitting: Occupational Therapy

## 2023-06-19 DIAGNOSIS — R278 Other lack of coordination: Secondary | ICD-10-CM

## 2023-06-19 DIAGNOSIS — M6281 Muscle weakness (generalized): Secondary | ICD-10-CM

## 2023-06-19 NOTE — Therapy (Signed)
OUTPATIENT OCCUPATIONAL THERAPY NEURO TREATMENT NOTE  Patient Name: Gregory Crane MRN: [id number] DOB:[birth year], 52 y.o., male Today's Date: 06/19/2023   REFERRING PROVIDER: Dale Hepler, MD  END OF SESSION:  OT End of Session - 06/19/23 1251     Visit Number 9    Number of Visits 24    Date for OT Re-Evaluation 08/12/23    Progress Note Due on Visit 10    OT Start Time 1100    OT Stop Time 1145    OT Time Calculation (min) 45 min    Activity Tolerance Patient tolerated treatment well    Behavior During Therapy [facility] for tasks assessed/performed                Past Medical History:  Diagnosis Date   Allergy    Anemia    Bell's palsy    Diabetes mellitus without complication (HCC)    diet controlled   Hypertension    Hypothyroidism    Kidney stones    Pseudotumor cerebri    Stroke [facility])    Past Surgical History:  Procedure Laterality Date   COLONOSCOPY WITH PROPOFOL N/A 03/30/2020   Procedure: COLONOSCOPY WITH PROPOFOL;  Surgeon: Regis Bill, MD;  Location: [facility] ENDOSCOPY;  Service: Endoscopy;  Laterality: N/A;   LOOP RECORDER INSERTION N/A 01/27/2018   Procedure: LOOP RECORDER INSERTION;  Surgeon: Duke Salvia, MD;  Location: [facility] INVASIVE CV LAB;  Service: Cardiovascular;  Laterality: N/A;   LUMBAR PUNCTURE     as child   NO PAST SURGERIES     TEE WITHOUT CARDIOVERSION N/A 01/07/2018   Procedure: TRANSESOPHAGEAL ECHOCARDIOGRAM (TEE);  Surgeon: Antonieta Iba, MD;  Location: [facility] ORS;  Service: Cardiovascular;  Laterality: N/A;   Patient Active Problem List   Diagnosis Date Noted   Fall 06/15/2023   Non-compliance with renal dialysis (HCC) 05/02/2023   Obesity (BMI 30-39.9) 04/30/2023   Volume overload 04/29/2023   Renal osteodystrophy 11/25/2022   Unsteady gait 10/05/2022   Steal syndrome of dialysis vascular access (HCC) 05/28/2022   Hydronephrosis, left 05/04/2022   Leukocytosis 05/04/2022   Pre-op evaluation 04/24/2022   Chest pain  01/13/2022   Diabetic retinopathy associated with diabetes mellitus due to underlying condition (HCC) 01/13/2022   ESRD on hemodialysis (HCC) 01/13/2022   Deafness in right ear 08/11/2021   Open wound 01/25/2021   History of colon polyps 10/15/2020   Postoperative hemorrhage involving digestive system following digestive system procedure 09/19/2020   Acute cholecystitis without calculus 09/09/2020   Type 2 diabetes mellitus, with long-term current use of insulin (HCC) 09/09/2020   Cryptogenic stroke (HCC) 07/13/2020   History of loop recorder 07/13/2020   History of 2019 novel coronavirus disease (COVID-19) 05/20/2020   Pneumonia due to COVID-19 virus 04/03/2020   Elevated troponin 04/03/2020   Acquired trigger finger 06/08/2019   Lymphedema 06/08/2019   Anemia 04/17/2019   Swelling of both lower extremities 01/10/2019   Facial droop 04/09/2018   Daytime somnolence 03/30/2018   Carotid artery disease (HCC) 02/03/2018   Intracranial vascular stenosis 09/12/2017   Cough 01/20/2017   Bell's palsy 11/10/2016   History of CVA (cerebrovascular accident) 11/10/2016   Benign localized hyperplasia of prostate with urinary obstruction 10/27/2016   History of nephrolithiasis 10/27/2016   TIA (transient ischemic attack) 10/20/2016   Near syncope 06/23/2016   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   Neuropathy 08/06/2015   Health care maintenance 08/06/2015   Essential hypertension 08/06/2015   Heme positive stool 10/10/2013   Hypothyroidism  10/10/2013   Microalbuminuria 10/10/2013   Hyperlipidemia 10/10/2013   B12 deficiency 10/10/2013   Environmental allergies 07/04/2013   ONSET DATE: 04/28/2023  REFERRING DIAG: Swelling BLEs, ESRD, CVA  THERAPY DIAG:  Muscle weakness (generalized)  Other lack of coordination  Rationale for Evaluation and Treatment: Rehabilitation  SUBJECTIVE:  SUBJECTIVE STATEMENT: 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 was hospitalized from 9/02-9/12/2022 for LE edema. Pt. was previously being followed by outpatient  OT services for weakness following a CVA. Pt. Restarted Hemodialysis. PMHx includes: ESRD on Hemodialysis, CVA, Hypothyroidism, Anemia, Type II DM, Volume overload, obesity, and noncompliant renal dialysis.  PRECAUTIONS: None  WEIGHT BEARING RESTRICTIONS: No  PAIN:  Are you having pain? 0/10  FALLS: Has patient fallen in last 6 months? No  LIVING ENVIRONMENT: Lives with:  Step daughter resides with the Pt.  Lives in: House/apartment Stairs:No external steps, Yes: Internal: 15 steps; on right going up Has following equipment at home: Walker - 2 wheeled  PLOF: Independent  PATIENT GOALS: To improve bilateral FMC primarily the left  OBJECTIVE:   HAND DOMINANCE: Right  ADLs: Transfers/ambulation related to ADLs: Eating: Independent, inefficient with cutting food Grooming: Independent UB Dressing: Independent, buttoning is challenging LB Dressing: Independent, difficulty with tying shoes Toileting: Independent Bathing: Independent Tub Shower transfers: Independent Equipment: none  IADLs: Shopping: Independent; difficulty with cash interaction Light housekeeping: Independent, slower than usual. Meal Prep: Independent, Requires increased time Community mobility: Drives Medication management: Difficulty setting handling small pills. Financial management: No changes Handwriting: 90% legible  MOBILITY STATUS: Independent  ACTIVITY TOLERANCE: Activity tolerance: Fatigues after 10-20 min.  FUNCTIONAL OUTCOME MEASURES: FOTO: TBD 2/2 set-up change required 05/22/23: FOTO: 65; predicted 65  UPPER EXTREMITY ROM:    Active ROM Right Eval WFL Left Eval [facility]  Shoulder flexion    Shoulder abduction    Shoulder adduction    Shoulder extension    Shoulder internal rotation    Shoulder external rotation    Elbow flexion    Elbow extension     Wrist flexion    Wrist extension    Wrist ulnar deviation    Wrist radial deviation    Wrist pronation    Wrist supination    (Blank rows = not tested)  UPPER EXTREMITY MMT:     MMT Right eval Left eval  Shoulder flexion 4/5 4/5  Shoulder abduction 4/5 4/5  Shoulder adduction    Shoulder extension    Shoulder internal rotation    Shoulder external rotation    Middle trapezius    Lower trapezius    Elbow flexion 4/5 4/5  Elbow extension 4/5 4/5  Wrist flexion 4/5 4/5  Wrist extension 4/5 4/5  Wrist ulnar deviation    Wrist radial deviation    Wrist pronation    Wrist supination    (Blank rows = not tested)  HAND FUNCTION: Grip strength: Right: 40 lbs; Left: 40 lbs, Lateral pinch: Right: 9 lbs, Left: 8 lbs, and 3 point pinch: Right: 6 lbs, Left: 5 lbs  COORDINATION:  9 Hole Peg test: Right: 32 sec; Left: 1 min & 8 sec. for *8* pegs only with multiple dropped pegs.  SENSATION:  Positive numbness increased in the Left 4th &5th digits, with mild numbness in all digits of the left hand  EDEMA: left   MUSCLE TONE: RUE: Hypotonic  COGNITION: Overall cognitive status: Within functional limits for tasks assessed  VISION: Subjective report: light  sensitivity, Retina specialist is following Pt.  Baseline vision: Wears glasses for reading only Visual history: macular degeneration Per Pt. Report: affects from years of Diabetes.  VISION ASSESSMENT: Continue to assess in functional context  PERCEPTION: WFL  PRAXIS: impaired  TODAY'S TREATMENT:                                                                                                                              DATE: 06/17/2023   Therapeutic Exercise:   Pt. completed reps of MP flex/ext with simultaneous PIP ext, and active assisted digit abd/add for the left hand.  Pt requires PIP blocking to prevent PIP flexion. Pt. Worked on digit Abduction/adduction with the left hand flat at the tabletop surface with  support at the 3rd digit, and wrist. Pt. performed left gross gripping with a gross grip strengthener. Pt. worked on sustaining grip while grasping pegs and reaching at various heights. The Gripper was set to 17.9# of grip strength resistance x's 3 trials with the left. Pt. worked on pinch strengthening in the left hand for lateral, and 3pt. pinch using yellow, red, and green resistive clips. Pt. worked on placing the clips at various vertical and horizontal angles. Tactile and verbal cues were required for eliciting the desired movement. Pt. worked on grasping one inch resistive cubes alternating thumb opposition to the tip of the 2nd through 5th digits. The board was positioned at a vertical angle. Pt. worked on pressing them back into place while isolating the 2nd digit.     PATIENT EDUCATION: Education details: intrinsic hand strengthening exercises bilat hands Person educated: Patient Education method: Medical illustrator Education comprehension: verbalized understanding and returned demonstration  HOME EXERCISE PROGRAM: Theraputty  GOALS: Goals reviewed with patient? Yes  SHORT TERM GOALS: Target date:  07/01/2023      Pt will demonstrate ability to hold and utilize utensils for cutting meat with modified independence. Baseline:Eval: Pt. Has difficulty with cutting, stabilizing item with fork for cutting Goal status: INITIAL    LONG TERM GOALS: Target date:  08/12/2023  Pt will demonstrate HEP with modified independence.   Baseline: Eval: HEPs to be modified, and upgraded as needed since recent hospitalization. Goal status: INITIAL   2.  Pt will improve left UE coordination by 10 secs on 9 hole peg test to demonstrate shoe tying with modified independence.  Baseline: R: 32 sec., L: 1 min. & 8 sec. for *8 pegs only.Pt. hand difficulty with efficiently tying shoes. Goal status:  INITIAL   3.  Pt will demonstrate improved coordination in left hand to demonstrate buttoning  buttons on clothing with modified independence.  Baseline: Pt. Has difficulty manipulating buttons. Goal status: INITIAL   4.  Pt will demonstrate improvement in bilat grip strength by 3# to open jars and containers with modified independence and greater ease. Baseline: Eval: R: 40#, L: 40# Pt. Has difficulty with opening jars and containers with use of left hand. Goal status:  INITIAL  5.  Pt will improve left UE strength by 1 mm grade to hold and hand wash dishes with modified independence.  Baseline: BUE strength 4/5 Goal status: INITIAL   6.  Pt will Improve FOTO score by 2 points or greater to show a clinically relevant change in LUE to impact greater independence with daily ADL and IADL tasks at home and in the community.   Baseline: Eval: TBD 2/2 set-up change required; 05/22/23: 65 Goal status: INITIAL  ASSESSMENT:  CLINICAL IMPRESSION:  Pt. reports that he will be having surgery on Nov. 13th on an outpatient basis, will start home Dialysis afterwards. Pt. continues to present with intrinsic hand weakness with difficulty performing digit MP Abduction, and adduction. Pt. presents with limited gross grip strength. Pt continues to demonstrate extensive weakness in R/L hands, though Left weaker than Right. Pt. Was able to complete 3 trials of gross gripping with the left hand. Pt. Required assist and support to stabilize the 3rd digit, and wrist during Abduction, and adduction exercises.  Pt continues to report difficulty opening containers and snack packages.  Pt. continues to benefit from OT services to work on improving BUE strength, hand function, and left hand [facility] skills in order to maximize independence with ADLs, and IADL tasks.   PERFORMANCE DEFICITS: in functional skills including ADLs, IADLs, coordination, dexterity, sensation, ROM, strength, Fine motor control, Gross motor control, balance, body mechanics, vision, and UE functional use.   IMPAIRMENTS: are limiting patient from  ADLs, IADLs, leisure, and social participation.   CO-MORBIDITIES: may have co-morbidities  that affects occupational performance. Patient will benefit from skilled OT to address above impairments and improve overall function.  MODIFICATION OR ASSISTANCE TO COMPLETE EVALUATION: Min-Moderate modification of tasks or assist with assess necessary to complete an evaluation.  OT OCCUPATIONAL PROFILE AND HISTORY: Detailed assessment: Review of records and additional review of physical, cognitive, psychosocial history related to current functional performance.  CLINICAL DECISION MAKING: Moderate - several treatment options, min-mod task modification necessary  REHAB POTENTIAL: Good  EVALUATION COMPLEXITY: Moderate    PLAN:  OT FREQUENCY: 2x/week  OT DURATION: 12 weeks  PLANNED INTERVENTIONS: self care/ADL training, therapeutic exercise, therapeutic activity, neuromuscular re-education, manual therapy, moist heat, patient/family education, and DME and/or AE instructions  RECOMMENDED OTHER SERVICES: PT  CONSULTED AND AGREED WITH PLAN OF CARE: Patient  PLAN FOR NEXT SESSION: see above  Olegario Messier, MS, OTR/L   06/19/2023

## 2023-06-23 ENCOUNTER — Ambulatory Visit: Payer: Medicare Other

## 2023-06-24 ENCOUNTER — Ambulatory Visit: Payer: Medicare Other | Admitting: Occupational Therapy

## 2023-06-24 ENCOUNTER — Ambulatory Visit: Payer: Medicare Other | Admitting: Physical Therapy

## 2023-06-24 DIAGNOSIS — R2681 Unsteadiness on feet: Secondary | ICD-10-CM

## 2023-06-24 DIAGNOSIS — R262 Difficulty in walking, not elsewhere classified: Secondary | ICD-10-CM

## 2023-06-24 DIAGNOSIS — R2689 Other abnormalities of gait and mobility: Secondary | ICD-10-CM

## 2023-06-24 DIAGNOSIS — M6281 Muscle weakness (generalized): Secondary | ICD-10-CM | POA: Diagnosis not present

## 2023-06-24 DIAGNOSIS — R278 Other lack of coordination: Secondary | ICD-10-CM

## 2023-06-24 DIAGNOSIS — R269 Unspecified abnormalities of gait and mobility: Secondary | ICD-10-CM

## 2023-06-24 NOTE — Therapy (Signed)
OUTPATIENT PHYSICAL THERAPY TREATMENT   Patient Name: Gregory Crane MRN: [id number] DOB:07-21-1971, 52 y.o., male Today's Date: 06/24/2023   PCP: Dale Willey MD REFERRING PROVIDER: Dale [place] MD  END OF SESSION:  PT End of Session - 06/24/23 1016     Visit Number 5    Number of Visits 16    Date for PT Re-Evaluation 07/15/23    Authorization Type Medicare; Medicaid    Authorization Time Period through 07/15/23    Authorization - Number of Visits 12    Progress Note Due on Visit 60    PT Start Time 1014    PT Stop Time 1058    PT Time Calculation (min) 44 min    Equipment Utilized During Treatment Gait belt    Activity Tolerance Patient tolerated treatment well    Behavior During Therapy WFL for tasks assessed/performed              Past Medical History:  Diagnosis Date   Allergy    Anemia    Bell's palsy    Diabetes mellitus without complication (HCC)    diet controlled   Hypertension    Hypothyroidism    Kidney stones    Pseudotumor cerebri    Stroke [facility])    Past Surgical History:  Procedure Laterality Date   COLONOSCOPY WITH PROPOFOL N/A 03/30/2020   Procedure: COLONOSCOPY WITH PROPOFOL;  Surgeon: Regis Bill, MD;  Location: [facility] ENDOSCOPY;  Service: Endoscopy;  Laterality: N/A;   LOOP RECORDER INSERTION N/A 01/27/2018   Procedure: LOOP RECORDER INSERTION;  Surgeon: Duke Salvia, MD;  Location: [facility] INVASIVE CV LAB;  Service: Cardiovascular;  Laterality: N/A;   LUMBAR PUNCTURE     as child   NO PAST SURGERIES     TEE WITHOUT CARDIOVERSION N/A 01/07/2018   Procedure: TRANSESOPHAGEAL ECHOCARDIOGRAM (TEE);  Surgeon: Antonieta Iba, MD;  Location: [facility] ORS;  Service: Cardiovascular;  Laterality: N/A;   Patient Active Problem List   Diagnosis Date Noted   Fall 06/15/2023   Non-compliance with renal dialysis (HCC) 05/02/2023   Obesity (BMI 30-39.9) 04/30/2023   Volume overload 04/29/2023   Renal osteodystrophy 11/25/2022   Unsteady  gait 10/05/2022   Steal syndrome of dialysis vascular access (HCC) 05/28/2022   Hydronephrosis, left 05/04/2022   Leukocytosis 05/04/2022   Pre-op evaluation 04/24/2022   Chest pain 01/13/2022   Diabetic retinopathy associated with diabetes mellitus due to underlying condition (HCC) 01/13/2022   ESRD on hemodialysis (HCC) 01/13/2022   Deafness in right ear 08/11/2021   Open wound 01/25/2021   History of colon polyps 10/15/2020   Postoperative hemorrhage involving digestive system following digestive system procedure 09/19/2020   Acute cholecystitis without calculus 09/09/2020   Type 2 diabetes mellitus, with long-term current use of insulin (HCC) 09/09/2020   Cryptogenic stroke (HCC) 07/13/2020   History of loop recorder 07/13/2020   History of 2019 novel coronavirus disease (COVID-19) 05/20/2020   Pneumonia due to COVID-19 virus 04/03/2020   Elevated troponin 04/03/2020   Acquired trigger finger 06/08/2019   Lymphedema 06/08/2019   Anemia 04/17/2019   Swelling of both lower extremities 01/10/2019   Facial droop 04/09/2018   Daytime somnolence 03/30/2018   Carotid artery disease (HCC) 02/03/2018   Intracranial vascular stenosis 09/12/2017   Cough 01/20/2017   Bell's palsy 11/10/2016   History of CVA (cerebrovascular accident) 11/10/2016   Benign localized hyperplasia of prostate with urinary obstruction 10/27/2016   History of nephrolithiasis 10/27/2016   TIA (transient ischemic attack) 10/20/2016  Near syncope 06/23/2016   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  Rationale for Evaluation and Treatment:  Rehabilitation  SUBJECTIVE:                                                                                                                                                                                             SUBJECTIVE STATEMENT:  Patient reports doing okay without any new issues. States he is still having trouble with his balance.   Pt accompanied by: self  PERTINENT HISTORY: Patient returning to PT after hospitalization after being off HD for past 3 months with end stage ESRD. PMH DM, HTN, Anemia, Bells palsy, Hypothyroidism, Kidney stones, stroke and pseudotumour cerebri. PAIN:  Are you having pain? No  PRECAUTIONS: Fall  RED FLAGS: None   WEIGHT BEARING RESTRICTIONS: No  FALLS: Has patient fallen in last 6 months? No  PATIENT GOALS: to get LLE stronger and more steady  OBJECTIVE:   TODAY'S TREATMENT:                                                                                                                              DATE: 06/24/23   TE:  Ambulation x 320 ft with focus on gait speed and equal step length.  Marched with 4# AW 2 x 10  Hip abductions w/AW in standing with 4# AW 2 x 10  Hamstring curls w/GTB   NMR Balance on Airex 3x30 seconds  SLS @ Bar 3 x 30 sec ea   TE Gluteal press down GTB 2 x 10 reps ea LE  DF, PF 2 x 10 ea LE with GTB in seated   NMR Ambulation multiplanar with 4# AW x 3 laps of forward and retro, forward  with 180 degree turn, and lateral walking.   Unless otherwise stated, CGA was provided and gait belt donned in order to ensure pt safety    PATIENT EDUCATION: Education details: goals, POC,  Person educated: Patient Education method: Explanation, Demonstration, Tactile cues, and Verbal cues Education comprehension: verbalized understanding, returned demonstration, verbal cues required, and tactile cues required  HOME EXERCISE PROGRAM: Access Code: [facility] [place] URL: [email or web address] Date:  06/17/2023 Prepared by: Maureen Ralphs  Exercises - Standing Balance in Corner with Eyes Closed  - 1 x daily - 2-3 x weekly - 3 sets - up to 30 sec hold - Standing Balance in Corner  - 1 x daily - 2-3 x weekly - 3 sets - 30 sec hold - Corner Balance Feet Apart: Eyes Open With Head Turns  - 1 x daily - 2-3 x weekly - 3 sets - 10 reps - Sit to Stand with Arms Crossed  - 1 x daily - 3 x weekly - 3 sets - 10 reps GOALS: Goals reviewed with patient? Yes  SHORT TERM GOALS: Target date: 06/17/2023   Patient will be independent in home exercise program to improve strength/mobility for better functional independence with ADLs. Baseline: Goal status: INITIAL   LONG TERM GOALS: Target date: 07/15/2023   Patient will increase FOTO score to equal to or greater than   72%  to demonstrate statistically significant improvement in mobility and quality of life.  Baseline: 9/24: 65% Goal status: INITIAL  2.   Patient (< 3 years old) will complete five times sit to stand test in < 10 seconds indicating an increased LE strength and improved balance. Baseline: 9/24: 20.23 seconds no hands, one near LOB Goal status: INITIAL  3.   Patient will increase Berg Balance score by >45/56 to demonstrate decreased fall risk during functional activities.  Baseline: 39 Goal status: INITIAL  4.    Patient will increase six minute walk test distance to >1400 for progression to age norm community ambulator and improve gait ability Baseline: 9/24: 1060  Goal status: INITIAL   ASSESSMENT:  CLINICAL IMPRESSION: Pt progresses with PT intervention this date. Pt challenged with multiplanar resisted gait showing impaired L sidestepping to involved side as most impaired of movements. Pt also challenged with general LE strength and static balance activities.  Patient will benefit from skilled physical therapy to improve strength, stability and mobility.   OBJECTIVE IMPAIRMENTS: Abnormal gait, cardiopulmonary  status limiting activity, decreased activity tolerance, decreased balance, decreased coordination, decreased mobility, difficulty walking, decreased strength, impaired flexibility, impaired sensation, impaired vision/preception, improper body mechanics, and postural dysfunction.   ACTIVITY LIMITATIONS: carrying, lifting, bending, sitting, standing, squatting, stairs, transfers, bed mobility, dressing, reach over head, hygiene/grooming, locomotion level, and caring for others  PARTICIPATION LIMITATIONS: meal prep, cleaning, laundry, personal finances, driving, shopping, community activity, occupation, and yard work  PERSONAL FACTORS: Age, Fitness, Past/current experiences, Time since onset of injury/illness/exacerbation, and 3+ comorbidities: DM, HTN, Anemia, Bells palsy, Hypothyroidism, Kidney stones, stroke and pseudotumour cerebri  are also affecting patient's functional outcome.   REHAB POTENTIAL: Good  CLINICAL DECISION MAKING: Evolving/moderate complexity  EVALUATION COMPLEXITY: Moderate  PLAN:  PT FREQUENCY: 2x/week  PT DURATION: 8 weeks  PLANNED INTERVENTIONS: Therapeutic exercises, Therapeutic activity, Neuromuscular re-education, Balance training, Gait training, Patient/Family education, Self Care, Joint mobilization, Stair training, Vestibular training, Canalith repositioning, Visual/preceptual remediation/compensation, Orthotic/Fit training, DME instructions, Dry Needling, Spinal mobilization, Cryotherapy, Moist heat, Splintting, Taping, Traction, Manual therapy, and Re-evaluation  PLAN FOR NEXT SESSION:  hip flexor strength and  ankle strength, balance, resisted multiplanar walking, L LE high level dynamic weight shifting.    Norman Herrlich, PT 06/24/2023, 10:17 AM  10:17 AM, 06/24/23

## 2023-06-24 NOTE — Therapy (Signed)
Occupational Therapy Progress Note  Dates of reporting period  05/20/2023   to   06/24/2023   Patient Name: Gregory Crane MRN: [id number] DOB:[birth year], 52 y.o., male Today's Date: 06/24/2023   REFERRING PROVIDER: Dale Dover, MD  END OF SESSION:  OT End of Session - 06/24/23 1116     Visit Number 10    Number of Visits 24    Date for OT Re-Evaluation 08/12/23    OT Start Time 1104    OT Stop Time 1145    OT Time Calculation (min) 41 min    Behavior During Therapy [facility] for tasks assessed/performed                Past Medical History:  Diagnosis Date   Allergy    Anemia    Bell's palsy    Diabetes mellitus without complication (HCC)    diet controlled   Hypertension    Hypothyroidism    Kidney stones    Pseudotumor cerebri    Stroke [facility])    Past Surgical History:  Procedure Laterality Date   COLONOSCOPY WITH PROPOFOL N/A 03/30/2020   Procedure: COLONOSCOPY WITH PROPOFOL;  Surgeon: Regis Bill, MD;  Location: [facility] ENDOSCOPY;  Service: Endoscopy;  Laterality: N/A;   LOOP RECORDER INSERTION N/A 01/27/2018   Procedure: LOOP RECORDER INSERTION;  Surgeon: Duke Salvia, MD;  Location: [facility] INVASIVE CV LAB;  Service: Cardiovascular;  Laterality: N/A;   LUMBAR PUNCTURE     as child   NO PAST SURGERIES     TEE WITHOUT CARDIOVERSION N/A 01/07/2018   Procedure: TRANSESOPHAGEAL ECHOCARDIOGRAM (TEE);  Surgeon: Antonieta Iba, MD;  Location: [facility] ORS;  Service: Cardiovascular;  Laterality: N/A;   Patient Active Problem List   Diagnosis Date Noted   Fall 06/15/2023   Non-compliance with renal dialysis (HCC) 05/02/2023   Obesity (BMI 30-39.9) 04/30/2023   Volume overload 04/29/2023   Renal osteodystrophy 11/25/2022   Unsteady gait 10/05/2022   Steal syndrome of dialysis vascular access (HCC) 05/28/2022   Hydronephrosis, left 05/04/2022   Leukocytosis 05/04/2022   Pre-op evaluation 04/24/2022   Chest pain 01/13/2022   Diabetic retinopathy associated with  diabetes mellitus due to underlying condition (HCC) 01/13/2022   ESRD on hemodialysis (HCC) 01/13/2022   Deafness in right ear 08/11/2021   Open wound 01/25/2021   History of colon polyps 10/15/2020   Postoperative hemorrhage involving digestive system following digestive system procedure 09/19/2020   Acute cholecystitis without calculus 09/09/2020   Type 2 diabetes mellitus, with long-term current use of insulin (HCC) 09/09/2020   Cryptogenic stroke (HCC) 07/13/2020   History of loop recorder 07/13/2020   History of 2019 novel coronavirus disease (COVID-19) 05/20/2020   Pneumonia due to COVID-19 virus 04/03/2020   Elevated troponin 04/03/2020   Acquired trigger finger 06/08/2019   Lymphedema 06/08/2019   Anemia 04/17/2019   Swelling of both lower extremities 01/10/2019   Facial droop 04/09/2018   Daytime somnolence 03/30/2018   Carotid artery disease (HCC) 02/03/2018   Intracranial vascular stenosis 09/12/2017   Cough 01/20/2017   Bell's palsy 11/10/2016   History of CVA (cerebrovascular accident) 11/10/2016   Benign localized hyperplasia of prostate with urinary obstruction 10/27/2016   History of nephrolithiasis 10/27/2016   TIA (transient ischemic attack) 10/20/2016   Near syncope 06/23/2016   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   Neuropathy 08/06/2015   Health care maintenance 08/06/2015   Essential hypertension 08/06/2015   Heme positive stool 10/10/2013   Hypothyroidism 10/10/2013   Microalbuminuria 10/10/2013  Hyperlipidemia 10/10/2013   B12 deficiency 10/10/2013   Environmental allergies 07/04/2013   ONSET DATE: 04/28/2023  REFERRING DIAG: Swelling BLEs, ESRD, CVA  THERAPY DIAG:  Muscle weakness (generalized)  Other lack of coordination  Rationale for Evaluation and Treatment: Rehabilitation  SUBJECTIVE:  SUBJECTIVE STATEMENT: 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 was hospitalized from  9/02-9/12/2022 for LE edema. Pt. was previously being followed by outpatient  OT services for weakness following a CVA. Pt. Restarted Hemodialysis. PMHx includes: ESRD on Hemodialysis, CVA, Hypothyroidism, Anemia, Type II DM, Volume overload, obesity, and noncompliant renal dialysis.  PRECAUTIONS: None  WEIGHT BEARING RESTRICTIONS: No  PAIN:  Are you having pain? 0/10  FALLS: Has patient fallen in last 6 months? No  LIVING ENVIRONMENT: Lives with:  Step daughter resides with the Pt.  Lives in: House/apartment Stairs:No external steps, Yes: Internal: 15 steps; on right going up Has following equipment at home: Walker - 2 wheeled  PLOF: Independent  PATIENT GOALS: To improve bilateral FMC primarily the left  OBJECTIVE:   HAND DOMINANCE: Right  ADLs: Transfers/ambulation related to ADLs: Eating: Independent, inefficient with cutting food Grooming: Independent UB Dressing: Independent, buttoning is challenging LB Dressing: Independent, difficulty with tying shoes Toileting: Independent Bathing: Independent Tub Shower transfers: Independent Equipment: none  IADLs: Shopping: Independent; difficulty with cash interaction Light housekeeping: Independent, slower than usual. Meal Prep: Independent, Requires increased time Community mobility: Drives Medication management: Difficulty setting handling small pills. Financial management: No changes Handwriting: 90% legible  MOBILITY STATUS: Independent  ACTIVITY TOLERANCE: Activity tolerance: Fatigues after 10-20 min.  FUNCTIONAL OUTCOME MEASURES: FOTO: TBD 2/2 set-up change required 05/22/23: FOTO: 65; predicted 65  UPPER EXTREMITY ROM:    Active ROM Right Eval WFL Left Eval [facility]  Shoulder flexion    Shoulder abduction    Shoulder adduction    Shoulder extension    Shoulder internal rotation    Shoulder external rotation    Elbow flexion    Elbow extension    Wrist flexion    Wrist extension    Wrist ulnar  deviation    Wrist radial deviation    Wrist pronation    Wrist supination    (Blank rows = not tested)  UPPER EXTREMITY MMT:     MMT Right eval Left eval Right 06/24/23 Left  06/24/2023  Shoulder flexion 4/5 4/5 4+/5 4+/5  Shoulder abduction 4/5 4/5 4+/5 4+/5  Shoulder adduction      Shoulder extension      Shoulder internal rotation      Shoulder external rotation      Middle trapezius      Lower trapezius      Elbow flexion 4/5 4/5 4+/5 4+/5  Elbow extension 4/5 4/5 4+/5 4+/5  Wrist flexion 4/5 4/5 4+/5 4+/5  Wrist extension 4/5 4/5 4+/5 4+/5  Wrist ulnar deviation      Wrist radial deviation      Wrist pronation      Wrist supination      (Blank rows = not tested)  HAND FUNCTION: Grip strength: Right: 40 lbs; Left: 40 lbs, Lateral pinch: Right: 9 lbs, Left: 8 lbs, and 3 point pinch: Right: 6 lbs, Left: 5 lbs  06/24/2023:  Grip strength: Right: 42 lbs; Left: 40 lbs, Lateral pinch: Right: 11 lbs, Left:13 lbs, and 3 point pinch: Right: 12 lbs, Left: 13 lbs    COORDINATION:  9 Hole Peg test: Right: 32 sec; Left:  1 min & 8 sec. for *8* pegs only with multiple dropped pegs.  06/24/2023:  9 Hole Peg test: Right: 38 sec; Left: 56 secs.   SENSATION:  Positive numbness increased in the Left 4th &5th digits, with mild numbness in all digits of the left hand  EDEMA: left   MUSCLE TONE: RUE: Hypotonic  COGNITION: Overall cognitive status: Within functional limits for tasks assessed  VISION: Subjective report: light sensitivity, Retina specialist is following Pt.  Baseline vision: Wears glasses for reading only Visual history: macular degeneration Per Pt. Report: affects from years of Diabetes.  VISION ASSESSMENT: Continue to assess in functional context  PERCEPTION: WFL  PRAXIS: impaired  TODAY'S TREATMENT:                                                                                                                              DATE: 06/24/2023    Measurements were obtained, and goals were reviewed with the Pt.   Therapeutic Exercise:   Pt. worked on the [employer] for 8 min. on level 4, adjusted to level 2 with constant monitoring of the BUEs. Rest breaks were required. Pt. completed reps of MP flex/ext with simultaneous PIP ext, and active assisted digit abd/add for the left hand.  Pt requires PIP blocking to prevent PIP flexion. Pt. worked on pinch strengthening in the left hand for lateral, and 3pt. pinch using yellow, red, and green resistive clips. Pt. worked on placing the clips at various vertical and horizontal angles. Tactile and verbal cues were required for eliciting the desired movement.     PATIENT EDUCATION: Education details: intrinsic hand strengthening exercises bilat hands Person educated: Patient Education method: Medical illustrator Education comprehension: verbalized understanding and returned demonstration  HOME EXERCISE PROGRAM: Theraputty  GOALS: Goals reviewed with patient? Yes  SHORT TERM GOALS: Target date:  07/01/2023      Pt will demonstrate ability to hold and utilize utensils for cutting meat with modified independence. Baseline:06/24/2023: Pt. Continues to have difficulty securely stabilizing the fork with the left hand while cutting food with the right hand. Eval: Pt. Has difficulty with cutting, stabilizing item with fork for cutting Goal status: INITIAL    LONG TERM GOALS: Target date:  08/12/2023  Pt will demonstrate HEP with modified independence.   Baseline: 06/24/2023: Independent. Continue to add exercises as needed. Eval: HEPs to be modified, and upgraded as needed since recent hospitalization. Goal status: Achieved    2.  Pt will improve left UE coordination by 10 secs on 9 hole peg test to demonstrate shoe tying with modified independence.  Baseline: 06/24/2023: Right: 38 sec; Left: 56 secs. R: 32 sec., L: 1 min. & 8 sec. for *8 pegs only.Pt. hand difficulty with  efficiently tying shoes. Goal status:  Ongoing   3.  Pt will demonstrate improved coordination in left hand to demonstrate buttoning buttons on clothing with modified independence.  Baseline: 06/24/2023: Pt. Is improving with buttoning, however takes  increased time to complete. Eval: Pt. Has difficulty manipulating buttons. Goal status: Ongoing   4.  Pt will demonstrate improvement in bilat grip strength by 3# to open jars and containers with modified independence and greater ease. Baseline: 06/24/2023: Right: 42 lbs; Left: 40 lbs Eval: R: 40#, L: 40# Pt. Has difficulty with opening jars and containers with use of left hand. Goal status: Ongoing  5.  Pt will improve left UE strength by 1 mm grade to hold and hand wash dishes with modified independence.  Baseline: 06/24/2023: BUE strength 4+/5 BUE strength 4/5 Goal status:  Progressing/Ongoing    6.  Pt will Improve FOTO score by 2 points or greater to show a clinically relevant change in LUE to impact greater independence with daily ADL and IADL tasks at home and in the community.   Baseline:  06/24/2023: 73 Eval: TBD 2/2 set-up change required; 05/22/23: 65 Goal status: Achieved  ASSESSMENT:  CLINICAL IMPRESSION:  Pt. reports that he will be having surgery on Nov. 13th on an outpatient basis, will start home Dialysis afterwards. Pt. continues to present with intrinsic hand weakness with difficulty performing digit MP Abduction, and adduction. Pt. presents with limited gross grip strength. Pt continues to demonstrate extensive weakness in R/L hands, though Left weaker than Right. Pt. is making progress towards established goals. Pt. has progressed with FOTO score of 73. Pt. has progressed with right grip strength, and left [facility] skills. Pt. continues to work on improving [facility] skills to manipulate small objects, button buttons, tie shoes, and cut meat. Pt. continues to benefit from OT services to work on improving BUE strength, hand function, and  left hand [facility] skills in order to maximize independence with ADLs, and IADL tasks.   PERFORMANCE DEFICITS: in functional skills including ADLs, IADLs, coordination, dexterity, sensation, ROM, strength, Fine motor control, Gross motor control, balance, body mechanics, vision, and UE functional use.   IMPAIRMENTS: are limiting patient from ADLs, IADLs, leisure, and social participation.   CO-MORBIDITIES: may have co-morbidities  that affects occupational performance. Patient will benefit from skilled OT to address above impairments and improve overall function.  MODIFICATION OR ASSISTANCE TO COMPLETE EVALUATION: Min-Moderate modification of tasks or assist with assess necessary to complete an evaluation.  OT OCCUPATIONAL PROFILE AND HISTORY: Detailed assessment: Review of records and additional review of physical, cognitive, psychosocial history related to current functional performance.  CLINICAL DECISION MAKING: Moderate - several treatment options, min-mod task modification necessary  REHAB POTENTIAL: Good  EVALUATION COMPLEXITY: Moderate    PLAN:  OT FREQUENCY: 2x/week  OT DURATION: 12 weeks  PLANNED INTERVENTIONS: self care/ADL training, therapeutic exercise, therapeutic activity, neuromuscular re-education, manual therapy, moist heat, patient/family education, and DME and/or AE instructions  RECOMMENDED OTHER SERVICES: PT  CONSULTED AND AGREED WITH PLAN OF CARE: Patient  PLAN FOR NEXT SESSION: see above  Olegario Messier, MS, OTR/L   06/24/2023

## 2023-06-25 ENCOUNTER — Ambulatory Visit: Payer: Medicare Other

## 2023-06-25 ENCOUNTER — Ambulatory Visit: Payer: Medicare Other | Admitting: Emergency Medicine

## 2023-06-25 VITALS — Ht 72.0 in | Wt 250.0 lb

## 2023-06-25 DIAGNOSIS — Z0000 Encounter for general adult medical examination without abnormal findings: Secondary | ICD-10-CM | POA: Diagnosis not present

## 2023-06-25 NOTE — Progress Notes (Signed)
Subjective:   Gregory Crane is a 52 y.o. male who presents for an Initial Medicare Annual Wellness Visit.  Visit Complete: Virtual I connected with  Gregory Crane on 06/25/23 by a audio enabled telemedicine application and verified that I am speaking with the correct person using two identifiers.  Patient Location: Home  Provider Location: Home Office  I discussed the limitations of evaluation and management by telemedicine. The patient expressed understanding and agreed to proceed.  Vital Signs: Because this visit was a virtual/telehealth visit, some criteria may be missing or patient reported. Any vitals not documented were not able to be obtained and vitals that have been documented are patient reported.   Cardiac Risk Factors include: male gender;hypertension;diabetes mellitus;dyslipidemia;obesity (BMI >30kg/m2)     Objective:    Today's Vitals   06/25/23 1410  Weight: 250 lb (113.4 kg)  Height: 6' (1.829 m)   Body mass index is 33.91 kg/m.     06/25/2023    2:25 PM 05/20/2023   10:07 AM 04/28/2023   10:40 PM 06/03/2022    2:07 PM 05/05/2022    1:39 AM 12/12/2021    6:10 PM 10/31/2020    9:38 AM  Advanced Directives  Does Patient Have a Medical Advance Directive? Yes No No No No No No  Does patient want to make changes to medical advance directive? Yes (MAU/Ambulatory/Procedural Areas - Information given)        Would patient like information on creating a medical advance directive?  No - Patient declined No - Patient declined  No - Patient declined  No - Patient declined    Current Medications (verified) Outpatient Encounter Medications as of 06/25/2023  Medication Sig   Accu-Chek FastClix Lancets MISC USE TO CHECK BLOOD SUGAR THREE TIMES DAILY AS DIRECTED   amLODipine (NORVASC) 10 MG tablet TAKE 1 TABLET(10 MG) BY MOUTH DAILY   aspirin (ASPIRIN LOW DOSE) 81 MG EC tablet TAKE 1 TABLET(81 MG) BY MOUTH DAILY   dorzolamide-timolol (COSOPT) 2-0.5 % ophthalmic solution  Place 1 drop into both eyes 2 (two) times daily.   glucose blood (FREESTYLE LITE) test strip Check blood sugars twice a day (Dx. 250.02)   hydrALAZINE (APRESOLINE) 50 MG tablet TAKE 1 TABLET(50 MG) BY MOUTH THREE TIMES DAILY   levothyroxine (SYNTHROID) 175 MCG tablet TAKE 1 TABLET (175 MCG) BY MOUTH ONCE DAILY BEFORE BREAKFAST.   pregabalin (LYRICA) 100 MG capsule TAKE 1 CAPSULE(100 MG) BY MOUTH THREE TIMES DAILY (Patient taking differently: Take 100 mg by mouth daily.)   RENVELA 800 MG tablet Take 800 mg by mouth 3 (three) times daily.   rosuvastatin (CRESTOR) 5 MG tablet Take 5 mg by mouth daily.   insulin glargine-yfgn (SEMGLEE, YFGN,) 100 UNIT/ML injection INJECT 10- 15 UNITS INTO THE SKIN DAILY (Patient not taking: Reported on 06/25/2023)   insulin lispro (HUMALOG) 100 UNIT/ML injection Inject 4-12 units three times daily before meals per sliding scale: If premeal sugar is <130, inject 4 units; if 131-180, inject 6 units; if 181-240, inject 8 units; if 241-300, inject 10 units, if  >300, inject 12 units (max daily dose 36 units) (Patient not taking: Reported on 06/25/2023)   Insulin Syringe-Needle U-100 (INSULIN SYRINGE .5CC/30GX5/16") 30G X 5/16" 0.5 ML MISC USE AS DIRECTED WITH INSULIN (Patient not taking: Reported on 06/25/2023)   No facility-administered encounter medications on file as of 06/25/2023.    Allergies (verified) Patient has no known allergies.   History: Past Medical History:  Diagnosis Date  Allergy    Anemia    Bell's palsy    Diabetes mellitus without complication (HCC)    diet controlled   Hypertension    Hypothyroidism    Kidney stones    Pseudotumor cerebri    Stroke [facility])    Past Surgical History:  Procedure Laterality Date   COLONOSCOPY WITH PROPOFOL N/A 03/30/2020   Procedure: COLONOSCOPY WITH PROPOFOL;  Surgeon: Regis Bill, MD;  Location: [facility] ENDOSCOPY;  Service: Endoscopy;  Laterality: N/A;   LOOP RECORDER INSERTION N/A 01/27/2018    Procedure: LOOP RECORDER INSERTION;  Surgeon: Duke Salvia, MD;  Location: [facility] INVASIVE CV LAB;  Service: Cardiovascular;  Laterality: N/A;   LUMBAR PUNCTURE     as child   NO PAST SURGERIES     TEE WITHOUT CARDIOVERSION N/A 01/07/2018   Procedure: TRANSESOPHAGEAL ECHOCARDIOGRAM (TEE);  Surgeon: Antonieta Iba, MD;  Location: [facility] ORS;  Service: Cardiovascular;  Laterality: N/A;   Family History  Problem Relation Age of Onset   Breast cancer Mother    Diabetes Father    Diabetes Sister    Arthritis Maternal Grandmother    Diabetes Maternal Grandmother    Social History   Socioeconomic History   Marital status: Widowed    Spouse name: Gregory Crane   Number of children: 0   Years of education: Not on file   Highest education level: Not on file  Occupational History   Occupation: disability  Tobacco Use   Smoking status: Never   Smokeless tobacco: Never  Vaping Use   Vaping status: Never Used  Substance and Sexual Activity   Alcohol use: No    Comment: last use early 2023   Drug use: No   Sexual activity: Yes  Other Topics Concern   Not on file  Social History Narrative   Wife deceased 2020/04/08, has 2 step children, independent at baseline.   Social Determinants of Health   Financial Resource Strain: Low Risk  (06/25/2023)   Overall Financial Resource Strain (CARDIA)    Difficulty of Paying Living Expenses: Not hard at all  Food Insecurity: No Food Insecurity (06/25/2023)   Hunger Vital Sign    Worried About Running Out of Food in the Last Year: Never true    Ran Out of Food in the Last Year: Never true  Transportation Needs: No Transportation Needs (06/25/2023)   PRAPARE - Administrator, Civil Service (Medical): No    Lack of Transportation (Non-Medical): No  Physical Activity: Insufficiently Active (06/25/2023)   Exercise Vital Sign    Days of Exercise per Week: 2 days    Minutes of Exercise per Session: 40 min  Stress: No Stress Concern Present  (06/25/2023)   [employer] of Occupational Health - Occupational Stress Questionnaire    Feeling of Stress : Only a little  Social Connections: Moderately Isolated (06/25/2023)   Social Connection and Isolation Panel [NHANES]    Frequency of Communication with Friends and Family: More than three times a week    Frequency of Social Gatherings with Friends and Family: Twice a week    Attends Religious Services: More than 4 times per year    Active Member of [employer] or Organizations: No    Attends Banker Meetings: Never    Marital Status: Widowed    Tobacco Counseling Counseling given: Not Answered   Clinical Intake:  Pre-visit preparation completed: Yes  Pain : No/denies pain     BMI - recorded: 33.91 Nutritional Status: BMI >  30  Obese Nutritional Risks: Nausea/ vomitting/ diarrhea (06/24/23) Diabetes: Yes CBG done?: No Did pt. bring in CBG monitor from home?: No  How often do you need to have someone help you when you read instructions, pamphlets, or other written materials from your doctor or pharmacy?: 3 - Sometimes (difficulty with small print) What is the last grade level you completed in school?: some college  Interpreter Needed?: No  Information entered by :: Tora Kindred, CMA   Activities of Daily Living    06/25/2023    2:13 PM 04/28/2023   10:40 PM  In your present state of health, do you have any difficulty performing the following activities:  Hearing? 0 0  Vision? 1 0  Comment difficulty with small print   Difficulty concentrating or making decisions? 0 0  Walking or climbing stairs? 1 1  Comment uses cane prn   Dressing or bathing? 0 0  Doing errands, shopping? 0 0  Preparing Food and eating ? N   Using the Toilet? N   In the past six months, have you accidently leaked urine? N   Do you have problems with loss of bowel control? Y   Comment sometimes has bowel incontinence   Managing your Medications? N   Managing your Finances?  N   Housekeeping or managing your Housekeeping? N     Patient Care Team: Dale Vac[place], MD as PCP - General (Internal Medicine)  Indicate any recent Medical Services you may have received from other than [facility] providers in the past year (date may be approximate).     Assessment:   This is a routine wellness examination for Gregory Crane.  Hearing/Vision screen Hearing Screening - Comments:: Denies hearing loss Vision Screening - Comments:: Gets routine eye exams   Goals Addressed             This Visit's Progress    Weight (lb) < 200 lb (90.7 kg)        Depression Screen    06/25/2023    2:21 PM 10/04/2022    9:59 AM 06/24/2022    2:15 PM 04/24/2022    9:26 AM 01/23/2022   11:03 AM 08/15/2020   11:22 AM 09/27/2019    2:40 PM  PHQ 2/9 Scores  PHQ - 2 Score 1 2 0 1 4 0 0  PHQ- 9 Score 2 5   8   0    Fall Risk    06/25/2023    2:26 PM 10/04/2022    9:59 AM 06/24/2022    2:15 PM 04/24/2022    9:26 AM 03/22/2022    2:43 PM  Fall Risk   Falls in the past year? 1 0 0 0 0  Number falls in past yr: 0 0 0 0 0  Injury with Fall? 0 0 0 0 0  Risk for fall due to : History of fall(s);Impaired balance/gait;Orthopedic patient No Fall Risks No Fall Risks No Fall Risks No Fall Risks  Follow up Education provided;Falls prevention discussed;Falls evaluation completed Falls evaluation completed Falls evaluation completed Falls evaluation completed Falls evaluation completed    MEDICARE RISK AT HOME: Medicare Risk at Home Any stairs in or around the home?: Yes If so, are there any without handrails?: No Home free of loose throw rugs in walkways, pet beds, electrical cords, etc?: Yes Adequate lighting in your home to reduce risk of falls?: Yes Life alert?: No Use of a cane, walker or w/c?: Yes (uses cane rarely) Grab bars in the bathroom?: No Shower  chair or bench in shower?: No Elevated toilet seat or a handicapped toilet?: No  TIMED UP AND GO:  Was the test performed? No    Cognitive  Function:        06/25/2023    2:27 PM  6CIT Screen  What Year? 0 points  What month? 0 points  What time? 0 points  Count back from 20 0 points  Months in reverse 0 points  Repeat phrase 0 points  Total Score 0 points    Immunizations Immunization History  Administered Date(s) Administered   Hepb-cpg 04/02/2022, 05/07/2022, 06/01/2022, 08/03/2022   Influenza Split 05/10/2014   Influenza, Seasonal, Injecte, Preservative Fre 06/09/2023   Influenza,inj,Quad PF,6+ Mos 06/21/2019, 05/16/2020, 06/26/2021   Influenza-Unspecified 07/02/2015, 05/30/2017, 05/26/2018, 05/26/2022   Pneumococcal Conjugate-13 05/18/2022    TDAP status: Up to date per patient. Will look for documentation.  Flu Vaccine status: Up to date  Pneumococcal vaccine status: Up to date  Covid-19 vaccine status: Declined, Education has been provided regarding the importance of this vaccine but patient still declined. Advised may receive this vaccine at local pharmacy or Health Dept.or vaccine clinic. Aware to provide a copy of the vaccination record if obtained from local pharmacy or Health Dept. Verbalized acceptance and understanding.  Qualifies for Shingles Vaccine? Yes   Zostavax completed No   Shingrix Completed?: No.    Education has been provided regarding the importance of this vaccine. Patient has been advised to call insurance company to determine out of pocket expense if they have not yet received this vaccine. Advised may also receive vaccine at local pharmacy or Health Dept. Verbalized acceptance and understanding.  Screening Tests Health Maintenance  Topic Date Due   DTaP/Tdap/Td (1 - Tdap) Never done   FOOT EXAM  06/18/2023   COVID-19 Vaccine (1) 06/25/2023 (Originally 08/09/1976)   Zoster Vaccines- Shingrix (1 of 2) 09/09/2023 (Originally [birth year]08/1989)   HEMOGLOBIN A1C  10/26/2023   OPHTHALMOLOGY EXAM  03/05/2024   Medicare Annual Wellness (AWV)  06/24/2024   Colonoscopy  12/12/2032    INFLUENZA VACCINE  Completed   Hepatitis C Screening  Completed   HIV Screening  Completed   HPV VACCINES  Aged Out    Health Maintenance  Health Maintenance Due  Topic Date Due   DTaP/Tdap/Td (1 - Tdap) Never done   FOOT EXAM  06/18/2023    Colorectal cancer screening: Type of screening: Colonoscopy. Completed 12/13/22. Repeat every 10 years  Lung Cancer Screening: (Low Dose CT Chest recommended if Age 38-80 years, 20 pack-year currently smoking OR have quit w/in 15years.) does not qualify.   Lung Cancer Screening Referral: n/a  Additional Screening:  Hepatitis C Screening: does not qualify; Completed 05/04/22  Vision Screening: Recommended annual ophthalmology exams for early detection of glaucoma and other disorders of the eye. Is the patient up to date with their annual eye exam?  Yes  Who is the provider or what is the name of the office in which the patient attends annual eye exams? [facility] If pt is not established with a provider, would they like to be referred to a provider to establish care? No .   Dental Screening: Recommended annual dental exams for proper oral hygiene  Diabetic Foot Exam: Diabetic Foot Exam: Overdue, Pt has been advised about the importance in completing this exam. Pt is scheduled for diabetic foot exam on patient will schedule.  Community Resource Referral / Chronic Care Management: CRR required this visit?  No   CCM required  this visit?  No    Plan:     I have personally reviewed and noted the following in the patient's chart:   Medical and social history Use of alcohol, tobacco or illicit drugs  Current medications and supplements including opioid prescriptions. Patient is not currently taking opioid prescriptions. Functional ability and status Nutritional status Physical activity Advanced directives List of other physicians Hospitalizations, surgeries, and ER visits in previous 12 months Vitals Screenings to include  cognitive, depression, and falls Referrals and appointments  In addition, I have reviewed and discussed with patient certain preventive protocols, quality metrics, and best practice recommendations. A written personalized care plan for preventive services as well as general preventive health recommendations were provided to patient.     Tora Kindred, CMA   06/25/2023   After Visit Summary: (MyChart) Due to this being a telephonic visit, the after visit summary with patients personalized plan was offered to patient via MyChart   Nurse Notes:  Needs shingles vaccine Needs DM foot exam. Patient will call and schedule with [facility]. Declined covid vaccine UTD with tetanus per patient. Will try to find documentation.

## 2023-06-25 NOTE — Patient Instructions (Addendum)
Gregory Crane , Thank you for taking time to come for your Medicare Wellness Visit. I appreciate your ongoing commitment to your health goals. Please review the following plan we discussed and let me know if I can assist you in the future.   Referrals/Orders/Follow-Ups/Clinician Recommendations: Consider getting the shingles vaccines. Bring in documentation of your last tetanus vaccine if you can find it. Call and schedule a diabetic foot exam with podiatrist at [facility].  This is a list of the screening recommended for you and due dates:  Health Maintenance  Topic Date Due   DTaP/Tdap/Td vaccine (1 - Tdap) Never done   Complete foot exam   06/18/2023   COVID-19 Vaccine (1) 06/25/2023*   Zoster (Shingles) Vaccine (1 of 2) 09/09/2023*   Hemoglobin A1C  10/26/2023   Eye exam for diabetics  03/05/2024   Medicare Annual Wellness Visit  06/24/2024   Colon Cancer Screening  12/12/2032   Flu Shot  Completed   Hepatitis C Screening  Completed   HIV Screening  Completed   HPV Vaccine  Aged Out  *Topic was postponed. The date shown is not the original due date.    Advanced directives: (ACP Link)Information on Advanced Care Planning can be found at [facility] of [place] Advance Health Care Directives Advance Health Care Directives ([email or web address]   Once you have completed the forms, please bring a copy of your health care power of attorney and living will to the office to be added to your chart at your convenience.   Next Medicare Annual Wellness Visit scheduled for next year: Yes, 06/30/24 @ 12:45pm  Fall Prevention in the Home, Adult Falls can cause injuries and affect people of all ages. There are many simple things that you can do to make your home safe and to help prevent falls. If you need it, ask for help making these changes. What actions can I take to prevent falls? General information Use good lighting in all rooms. Make sure to: Replace any light bulbs that burn out. Turn on  lights if it is dark and use night-lights. Keep items that you use often in easy-to-reach places. Lower the shelves around your home if needed. Move furniture so that there are clear paths around it. Do not keep throw rugs or other things on the floor that can make you trip. If any of your floors are uneven, fix them. Add color or contrast paint or tape to clearly mark and help you see: Grab bars or handrails. First and last steps of staircases. Where the edge of each step is. If you use a ladder or stepladder: Make sure that it is fully opened. Do not climb a closed ladder. Make sure the sides of the ladder are locked in place. Have someone hold the ladder while you use it. Know where your pets are as you move through your home. What can I do in the bathroom?     Keep the floor dry. Clean up any water that is on the floor right away. Remove soap buildup in the bathtub or shower. Buildup makes bathtubs and showers slippery. Use non-skid mats or decals on the floor of the bathtub or shower. Attach bath mats securely with double-sided, non-slip rug tape. If you need to sit down while you are in the shower, use a non-slip stool. Install grab bars by the toilet and in the bathtub and shower. Do not use towel bars as grab bars. What can I do in the bedroom? Make sure  that you have a light by your bed that is easy to reach. Do not use any sheets or blankets on your bed that hang to the floor. Have a firm bench or chair with side arms that you can use for support when you get dressed. What can I do in the kitchen? Clean up any spills right away. If you need to reach something above you, use a sturdy step stool that has a grab bar. Keep electrical cables out of the way. Do not use floor polish or wax that makes floors slippery. What can I do with my stairs? Do not leave anything on the stairs. Make sure that you have a light switch at the top and the bottom of the stairs. Have them  installed if you do not have them. Make sure that there are handrails on both sides of the stairs. Fix handrails that are broken or loose. Make sure that handrails are as long as the staircases. Install non-slip stair treads on all stairs in your home if they do not have carpet. Avoid having throw rugs at the top or bottom of stairs, or secure the rugs with carpet tape to prevent them from moving. Choose a carpet design that does not hide the edge of steps on the stairs. Make sure that carpet is firmly attached to the stairs. Fix any carpet that is loose or worn. What can I do on the outside of my home? Use bright outdoor lighting. Repair the edges of walkways and driveways and fix any cracks. Clear paths of anything that can make you trip, such as tools or rocks. Add color or contrast paint or tape to clearly mark and help you see high doorway thresholds. Trim any bushes or trees on the main path into your home. Check that handrails are securely fastened and in good repair. Both sides of all steps should have handrails. Install guardrails along the edges of any raised decks or porches. Have leaves, snow, and ice cleared regularly. Use sand, salt, or ice melt on walkways during winter months if you live where there is ice and snow. In the garage, clean up any spills right away, including grease or oil spills. What other actions can I take? Review your medicines with your health care provider. Some medicines can make you confused or feel dizzy. This can increase your chance of falling. Wear closed-toe shoes that fit well and support your feet. Wear shoes that have rubber soles and low heels. Use a cane, walker, scooter, or crutches that help you move around if needed. Talk with your provider about other ways that you can decrease your risk of falls. This may include seeing a physical therapist to learn to do exercises to improve movement and strength. Where to find more information Centers for  Disease Control and Prevention, STEADI: TonerPromos.no [employer] on Aging: BaseRingTones.pl National Institute on Aging: BaseRingTones.pl Contact a health care provider if: You are afraid of falling at home. You feel weak, drowsy, or dizzy at home. You fall at home. Get help right away if you: Lose consciousness or have trouble moving after a fall. Have a fall that causes a head injury. These symptoms may be an emergency. Get help right away. Call 911. Do not wait to see if the symptoms will go away. Do not drive yourself to the hospital. This information is not intended to replace advice given to you by your health care provider. Make sure you discuss any questions you have with your health  care provider. Document Revised: 04/15/2022 Document Reviewed: 04/15/2022 Elsevier Patient Education  2024 [employer].

## 2023-06-26 ENCOUNTER — Ambulatory Visit: Payer: Medicare Other

## 2023-06-26 DIAGNOSIS — M6281 Muscle weakness (generalized): Secondary | ICD-10-CM

## 2023-06-26 DIAGNOSIS — R278 Other lack of coordination: Secondary | ICD-10-CM

## 2023-06-26 NOTE — Therapy (Signed)
Occupational Therapy Discharge Note  Patient Name: Gregory Crane MRN: [id number] DOB:November 22, 1970, 52 y.o., male Today's Date: 06/26/2023   REFERRING PROVIDER: Dale [place], MD  END OF SESSION:  OT End of Session - 06/26/23 1431     Visit Number 11    Number of Visits 24    Date for OT Re-Evaluation 08/12/23    Progress Note Due on Visit 10    OT Start Time 1100    OT Stop Time 1115    OT Time Calculation (min) 15 min    Activity Tolerance Patient tolerated treatment well    Behavior During Therapy [facility] for tasks assessed/performed                Past Medical History:  Diagnosis Date   Allergy    Anemia    Bell's palsy    Diabetes mellitus without complication (HCC)    diet controlled   Hypertension    Hypothyroidism    Kidney stones    Pseudotumor cerebri    Stroke [facility])    Past Surgical History:  Procedure Laterality Date   COLONOSCOPY WITH PROPOFOL N/A 03/30/2020   Procedure: COLONOSCOPY WITH PROPOFOL;  Surgeon: Regis Bill, MD;  Location: [facility] ENDOSCOPY;  Service: Endoscopy;  Laterality: N/A;   LOOP RECORDER INSERTION N/A 01/27/2018   Procedure: LOOP RECORDER INSERTION;  Surgeon: Duke Salvia, MD;  Location: [facility] INVASIVE CV LAB;  Service: Cardiovascular;  Laterality: N/A;   LUMBAR PUNCTURE     as child   NO PAST SURGERIES     TEE WITHOUT CARDIOVERSION N/A 01/07/2018   Procedure: TRANSESOPHAGEAL ECHOCARDIOGRAM (TEE);  Surgeon: Antonieta Iba, MD;  Location: [facility] ORS;  Service: Cardiovascular;  Laterality: N/A;   Patient Active Problem List   Diagnosis Date Noted   Fall 06/15/2023   Non-compliance with renal dialysis (HCC) 05/02/2023   Obesity (BMI 30-39.9) 04/30/2023   Volume overload 04/29/2023   Renal osteodystrophy 11/25/2022   Unsteady gait 10/05/2022   Steal syndrome of dialysis vascular access (HCC) 05/28/2022   Hydronephrosis, left 05/04/2022   Leukocytosis 05/04/2022   Pre-op evaluation 04/24/2022   Chest pain 01/13/2022    Diabetic retinopathy associated with diabetes mellitus due to underlying condition (HCC) 01/13/2022   ESRD on hemodialysis (HCC) 01/13/2022   Deafness in right ear 08/11/2021   Open wound 01/25/2021   History of colon polyps 10/15/2020   Postoperative hemorrhage involving digestive system following digestive system procedure 09/19/2020   Acute cholecystitis without calculus 09/09/2020   Type 2 diabetes mellitus, with long-term current use of insulin (HCC) 09/09/2020   Cryptogenic stroke (HCC) 07/13/2020   History of loop recorder 07/13/2020   History of 2019 novel coronavirus disease (COVID-19) 05/20/2020   Pneumonia due to COVID-19 virus 04/03/2020   Elevated troponin 04/03/2020   Acquired trigger finger 06/08/2019   Lymphedema 06/08/2019   Anemia 04/17/2019   Swelling of both lower extremities 01/10/2019   Facial droop 04/09/2018   Daytime somnolence 03/30/2018   Carotid artery disease (HCC) 02/03/2018   Intracranial vascular stenosis 09/12/2017   Cough 01/20/2017   Bell's palsy 11/10/2016   History of CVA (cerebrovascular accident) 11/10/2016   Benign localized hyperplasia of prostate with urinary obstruction 10/27/2016   History of nephrolithiasis 10/27/2016   TIA (transient ischemic attack) 10/20/2016   Near syncope 06/23/2016   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   Neuropathy 08/06/2015   Health care maintenance 08/06/2015   Essential hypertension 08/06/2015   Heme positive stool 10/10/2013   Hypothyroidism 10/10/2013  Microalbuminuria 10/10/2013   Hyperlipidemia 10/10/2013   B12 deficiency 10/10/2013   Environmental allergies 07/04/2013   ONSET DATE: 04/28/2023  REFERRING DIAG: Swelling BLEs, ESRD, CVA  THERAPY DIAG:  Muscle weakness (generalized)  Other lack of coordination  Rationale for Evaluation and Treatment: Rehabilitation  SUBJECTIVE:  SUBJECTIVE STATEMENT: Pt reports readiness to d/c OT.   Pt accompanied by: self  PERTINENT HISTORY: Pt. was hospitalized  from 9/02-9/12/2022 for LE edema. Pt. was previously being followed by outpatient  OT services for weakness following a CVA. Pt. Restarted Hemodialysis. PMHx includes: ESRD on Hemodialysis, CVA, Hypothyroidism, Anemia, Type II DM, Volume overload, obesity, and noncompliant renal dialysis.  PRECAUTIONS: None  WEIGHT BEARING RESTRICTIONS: No  PAIN:  Are you having pain? 0/10  FALLS: Has patient fallen in last 6 months? No  LIVING ENVIRONMENT: Lives with:  Step daughter resides with the Pt.  Lives in: House/apartment Stairs:No external steps, Yes: Internal: 15 steps; on right going up Has following equipment at home: Walker - 2 wheeled  PLOF: Independent  PATIENT GOALS: To improve bilateral FMC primarily the left  OBJECTIVE:   HAND DOMINANCE: Right  ADLs: Transfers/ambulation related to ADLs: Eating: Independent, inefficient with cutting food Grooming: Independent UB Dressing: Independent, buttoning is challenging LB Dressing: Independent, difficulty with tying shoes Toileting: Independent Bathing: Independent Tub Shower transfers: Independent Equipment: none  IADLs: Shopping: Independent; difficulty with cash interaction Light housekeeping: Independent, slower than usual. Meal Prep: Independent, Requires increased time Community mobility: Drives Medication management: Difficulty setting handling small pills. Financial management: No changes Handwriting: 90% legible  MOBILITY STATUS: Independent  ACTIVITY TOLERANCE: Activity tolerance: Fatigues after 10-20 min.  FUNCTIONAL OUTCOME MEASURES: FOTO: TBD 2/2 set-up change required 05/22/23: FOTO: 65; predicted 65  UPPER EXTREMITY ROM:    Active ROM Right Eval WFL Left Eval [facility]  Shoulder flexion    Shoulder abduction    Shoulder adduction    Shoulder extension    Shoulder internal rotation    Shoulder external rotation    Elbow flexion    Elbow extension    Wrist flexion    Wrist extension    Wrist ulnar  deviation    Wrist radial deviation    Wrist pronation    Wrist supination    (Blank rows = not tested)  UPPER EXTREMITY MMT:     MMT Right eval Left eval Right 06/24/23 Left  06/24/2023  Shoulder flexion 4/5 4/5 4+/5 4+/5  Shoulder abduction 4/5 4/5 4+/5 4+/5  Shoulder adduction      Shoulder extension      Shoulder internal rotation      Shoulder external rotation      Middle trapezius      Lower trapezius      Elbow flexion 4/5 4/5 4+/5 4+/5  Elbow extension 4/5 4/5 4+/5 4+/5  Wrist flexion 4/5 4/5 4+/5 4+/5  Wrist extension 4/5 4/5 4+/5 4+/5  Wrist ulnar deviation      Wrist radial deviation      Wrist pronation      Wrist supination      (Blank rows = not tested)  HAND FUNCTION: Grip strength: Right: 40 lbs; Left: 40 lbs, Lateral pinch: Right: 9 lbs, Left: 8 lbs, and 3 point pinch: Right: 6 lbs, Left: 5 lbs  06/24/2023:  Grip strength: Right: 42 lbs; Left: 40 lbs, Lateral pinch: Right: 11 lbs, Left:13 lbs, and 3 point pinch: Right: 12 lbs, Left: 13 lbs  COORDINATION:  9 Hole Peg test: Right: 32 sec; Left:  1 min & 8 sec. for *8* pegs only with multiple dropped pegs.  06/24/2023:  9 Hole Peg test: Right: 38 sec; Left: 56 secs.  SENSATION:  Positive numbness increased in the Left 4th &5th digits, with mild numbness in all digits of the left hand  EDEMA: left   MUSCLE TONE: RUE: Hypotonic  COGNITION: Overall cognitive status: Within functional limits for tasks assessed  VISION: Subjective report: light sensitivity, Retina specialist is following Pt.  Baseline vision: Wears glasses for reading only Visual history: macular degeneration Per Pt. Report: affects from years of Diabetes.  VISION ASSESSMENT: Continue to assess in functional context  PERCEPTION: WFL  PRAXIS: impaired  TODAY'S TREATMENT:                                                                                                                              DATE: 06/26/2023   Measurements not obtained as they were taken last session for progress update.    Therapeutic Activity: Reviewed progress update from last session as pt reports after measurements taken, there was little time to discuss plan moving forward last visit.  Though pt demonstrated some slight improvements with pinch strength last visit, minimal other functional gains or strength gains noted.  Discussed possibility of current function being pt's new baseline.  Pt agreed and reported feeling therapy has been minimally effective for strengthening BUEs.  Reviewed compensatory aids for ADL goals noted below, including use of button hook, elastic shoe laces, use of dicem for easing ability to open containers.  Pt feels dicem is effective but button hook and elastic laces not necessary at this time.  Encouraged pt continue with HEP completion to maintain strength and prevent decline in current BUE strength d/t pt anticipating upcoming procedures with dialysis.  Pt agreed/verbalized understanding.  Pt in agreement with OT d/c this date.    PATIENT EDUCATION: Education details: HEP review Person educated: Patient Education method: Medical illustrator Education comprehension: verbalized understanding  HOME EXERCISE PROGRAM: Theraputty  GOALS: Goals reviewed with patient? Yes  SHORT TERM GOALS: Target date:  07/01/2023    Pt will demonstrate ability to hold and utilize utensils for cutting meat with modified independence. Baseline:06/24/2023: Pt. Continues to have difficulty securely stabilizing the fork with the left hand while cutting food with the right hand. Eval: Pt. Has difficulty with cutting, stabilizing item with fork for cutting Goal status: Discharge   LONG TERM GOALS: Target date:  08/12/2023  Pt will demonstrate HEP with modified independence.   Baseline: 06/24/2023: Independent. Continue to add exercises as needed. Eval: HEPs to be modified, and upgraded as needed since recent  hospitalization. Goal status: Achieved    2.  Pt will improve left UE coordination by 10 secs on 9 hole peg test to demonstrate shoe tying with modified independence.  Baseline: 06/24/2023: Right: 38 sec; Left: 56 secs. R: 32 sec., L: 1 min. & 8 sec. for *8 pegs only.Pt. hand difficulty with  efficiently tying shoes. Goal status:  Discharged   3.  Pt will demonstrate improved coordination in left hand to demonstrate buttoning buttons on clothing with modified independence.  Baseline: 06/24/2023: Pt. Is improving with buttoning, however takes increased time to complete. Eval: Pt. Has difficulty manipulating buttons. Goal status: Discharged   4.  Pt will demonstrate improvement in bilat grip strength by 3# to open jars and containers with modified independence and greater ease. Baseline: 06/24/2023: Right: 42 lbs; Left: 40 lbs Eval: R: 40#, L: 40# Pt. Has difficulty with opening jars and containers with use of left hand. Goal status: Discharged  5.  Pt will improve left UE strength by 1 mm grade to hold and hand wash dishes with modified independence.  Baseline: 06/24/2023: BUE strength 4+/5 BUE strength 4/5 Goal status:  Discharged   6.  Pt will Improve FOTO score by 2 points or greater to show a clinically relevant change in LUE to impact greater independence with daily ADL and IADL tasks at home and in the community.   Baseline:  06/24/2023: 73 Eval: TBD 2/2 set-up change required; 05/22/23: 65 Goal status: Achieved  ASSESSMENT:  CLINICAL IMPRESSION: Reviewed progress update from last session as pt reports after measurements taken, there was little time to discuss plan moving forward last visit.  Though pt demonstrated some slight improvements with pinch strength last visit, minimal other functional gains or strength gains noted.  Discussed possibility of current function being pt's new baseline.  Pt agreed and reported feeling therapy has been minimally effective for strengthening BUEs.   Reviewed compensatory aids for ADL goals noted below, including use of button hook, elastic shoe laces, use of dicem for easing ability to open containers.  Pt feels dicem is effective but button hook and elastic laces not necessary at this time.  Encouraged pt continue with HEP completion to maintain strength and prevent decline in current BUE strength d/t pt anticipating upcoming procedures with dialysis.  Pt agreed/verbalized understanding.  Pt in agreement with OT d/c this date.    PERFORMANCE DEFICITS: in functional skills including ADLs, IADLs, coordination, dexterity, sensation, ROM, strength, Fine motor control, Gross motor control, balance, body mechanics, vision, and UE functional use.   IMPAIRMENTS: are limiting patient from ADLs, IADLs, leisure, and social participation.   CO-MORBIDITIES: may have co-morbidities  that affects occupational performance. Patient will benefit from skilled OT to address above impairments and improve overall function.  MODIFICATION OR ASSISTANCE TO COMPLETE EVALUATION: Min-Moderate modification of tasks or assist with assess necessary to complete an evaluation.  OT OCCUPATIONAL PROFILE AND HISTORY: Detailed assessment: Review of records and additional review of physical, cognitive, psychosocial history related to current functional performance.  CLINICAL DECISION MAKING: Moderate - several treatment options, min-mod task modification necessary  REHAB POTENTIAL: Good  EVALUATION COMPLEXITY: Moderate    PLAN:  OT FREQUENCY: 2x/week  OT DURATION: 12 weeks  PLANNED INTERVENTIONS: self care/ADL training, therapeutic exercise, therapeutic activity, neuromuscular re-education, manual therapy, moist heat, patient/family education, and DME and/or AE instructions  RECOMMENDED OTHER SERVICES: PT  CONSULTED AND AGREED WITH PLAN OF CARE: Patient  PLAN FOR NEXT SESSION: N/A; d/c this date  Danelle Earthly, MS, OTR/L  06/26/2023

## 2023-06-30 ENCOUNTER — Ambulatory Visit: Payer: Medicare Other

## 2023-07-01 ENCOUNTER — Ambulatory Visit: Payer: Medicare Other

## 2023-07-02 ENCOUNTER — Other Ambulatory Visit: Payer: Self-pay

## 2023-07-02 ENCOUNTER — Other Ambulatory Visit: Payer: Medicare Other

## 2023-07-02 ENCOUNTER — Ambulatory Visit: Payer: Medicare Other

## 2023-07-02 ENCOUNTER — Encounter: Payer: Self-pay | Admitting: Emergency Medicine

## 2023-07-02 ENCOUNTER — Emergency Department: Payer: Medicare Other

## 2023-07-02 ENCOUNTER — Observation Stay
Admission: EM | Admit: 2023-07-02 | Discharge: 2023-07-03 | Disposition: A | Discharge status: Home / Self Care | Payer: Medicare Other | Attending: Hospitalist | Admitting: Hospitalist

## 2023-07-02 ENCOUNTER — Inpatient Hospital Stay (HOSPITAL_COMMUNITY)
Admit: 2023-07-02 | Discharge: 2023-07-02 | Disposition: A | Discharge status: Home / Self Care | Payer: Medicare Other | Attending: Cardiovascular Disease | Admitting: Cardiovascular Disease

## 2023-07-02 DIAGNOSIS — R7989 Other specified abnormal findings of blood chemistry: Secondary | ICD-10-CM

## 2023-07-02 DIAGNOSIS — I4891 Unspecified atrial fibrillation: Principal | ICD-10-CM | POA: Diagnosis present

## 2023-07-02 DIAGNOSIS — Z794 Long term (current) use of insulin: Secondary | ICD-10-CM | POA: Insufficient documentation

## 2023-07-02 DIAGNOSIS — I10 Essential (primary) hypertension: Secondary | ICD-10-CM | POA: Diagnosis not present

## 2023-07-02 DIAGNOSIS — Z8673 Personal history of transient ischemic attack (TIA), and cerebral infarction without residual deficits: Secondary | ICD-10-CM

## 2023-07-02 DIAGNOSIS — Z7901 Long term (current) use of anticoagulants: Secondary | ICD-10-CM | POA: Insufficient documentation

## 2023-07-02 DIAGNOSIS — I214 Non-ST elevation (NSTEMI) myocardial infarction: Secondary | ICD-10-CM | POA: Diagnosis not present

## 2023-07-02 DIAGNOSIS — Z79899 Other long term (current) drug therapy: Secondary | ICD-10-CM | POA: Insufficient documentation

## 2023-07-02 DIAGNOSIS — Z992 Dependence on renal dialysis: Secondary | ICD-10-CM | POA: Diagnosis not present

## 2023-07-02 DIAGNOSIS — N186 End stage renal disease: Secondary | ICD-10-CM

## 2023-07-02 DIAGNOSIS — I2489 Other forms of acute ischemic heart disease: Secondary | ICD-10-CM | POA: Diagnosis not present

## 2023-07-02 DIAGNOSIS — E1122 Type 2 diabetes mellitus with diabetic chronic kidney disease: Secondary | ICD-10-CM | POA: Diagnosis not present

## 2023-07-02 DIAGNOSIS — E785 Hyperlipidemia, unspecified: Secondary | ICD-10-CM | POA: Diagnosis present

## 2023-07-02 DIAGNOSIS — G510 Bell's palsy: Secondary | ICD-10-CM

## 2023-07-02 DIAGNOSIS — E119 Type 2 diabetes mellitus without complications: Secondary | ICD-10-CM

## 2023-07-02 DIAGNOSIS — I120 Hypertensive chronic kidney disease with stage 5 chronic kidney disease or end stage renal disease: Secondary | ICD-10-CM | POA: Insufficient documentation

## 2023-07-02 DIAGNOSIS — E039 Hypothyroidism, unspecified: Secondary | ICD-10-CM | POA: Diagnosis present

## 2023-07-02 LAB — COMPREHENSIVE METABOLIC PANEL
ALT: 8 U/L (ref 0–44)
AST: 12 U/L — ABNORMAL LOW (ref 15–41)
Albumin: 3.1 g/dL — ABNORMAL LOW (ref 3.5–5.0)
Alkaline Phosphatase: 69 U/L (ref 38–126)
Anion gap: 12 (ref 5–15)
BUN: 27 mg/dL — ABNORMAL HIGH (ref 6–20)
CO2: 24 mmol/L (ref 22–32)
Calcium: 7.5 mg/dL — ABNORMAL LOW (ref 8.9–10.3)
Chloride: 103 mmol/L (ref 98–111)
Creatinine, Ser: 6.71 mg/dL — ABNORMAL HIGH (ref 0.61–1.24)
GFR, Estimated: 9 mL/min — ABNORMAL LOW (ref >60)
Glucose, Bld: 160 mg/dL — ABNORMAL HIGH (ref 70–99)
Potassium: 3.2 mmol/L — ABNORMAL LOW (ref 3.5–5.1)
Sodium: 139 mmol/L (ref 135–145)
Total Bilirubin: 0.8 mg/dL (ref <1.2)
Total Protein: 6.5 g/dL (ref 6.5–8.1)

## 2023-07-02 LAB — GLUCOSE, CAPILLARY: Glucose-Capillary: 144 mg/dL — ABNORMAL HIGH (ref 70–99)

## 2023-07-02 LAB — CBC WITH DIFFERENTIAL/PLATELET
Abs Immature Granulocytes: 0.04 10*3/uL (ref 0.00–0.07)
Basophils Absolute: 0 10*3/uL (ref 0.0–0.1)
Basophils Relative: 0 %
Eosinophils Absolute: 0.1 10*3/uL (ref 0.0–0.5)
Eosinophils Relative: 1 %
HCT: 42.8 % (ref 39.0–52.0)
Hemoglobin: 14.5 g/dL (ref 13.0–17.0)
Immature Granulocytes: 0 %
Lymphocytes Relative: 8 %
Lymphs Abs: 0.9 10*3/uL (ref 0.7–4.0)
MCH: 30.5 pg (ref 26.0–34.0)
MCHC: 33.9 g/dL (ref 30.0–36.0)
MCV: 90.1 fL (ref 80.0–100.0)
Monocytes Absolute: 0.8 10*3/uL (ref 0.1–1.0)
Monocytes Relative: 7 %
Neutro Abs: 9.9 10*3/uL — ABNORMAL HIGH (ref 1.7–7.7)
Neutrophils Relative %: 84 %
Platelets: 205 10*3/uL (ref 150–400)
RBC: 4.75 MIL/uL (ref 4.22–5.81)
RDW: 12.6 % (ref 11.5–15.5)
WBC: 11.8 10*3/uL — ABNORMAL HIGH (ref 4.0–10.5)
nRBC: 0 % (ref 0.0–0.2)

## 2023-07-02 LAB — ECHOCARDIOGRAM COMPLETE
AR max vel: 2.79 cm2
AV Area VTI: 2.63 cm2
AV Area mean vel: 2.52 cm2
AV Mean grad: 3 mm[Hg]
AV Peak grad: 4.7 mm[Hg]
Ao pk vel: 1.08 m/s
Area-P 1/2: 4.19 cm2
Height: 72 in
MV VTI: 1.93 cm2
S' Lateral: 3.9 cm
Weight: 4000 [oz_av]

## 2023-07-02 LAB — TSH: TSH: 8.718 u[IU]/mL — ABNORMAL HIGH (ref 0.350–4.500)

## 2023-07-02 LAB — MAGNESIUM: Magnesium: 2.1 mg/dL (ref 1.7–2.4)

## 2023-07-02 LAB — BRAIN NATRIURETIC PEPTIDE: B Natriuretic Peptide: 1514.8 pg/mL — ABNORMAL HIGH (ref 0.0–100.0)

## 2023-07-02 LAB — TROPONIN I (HIGH SENSITIVITY)
Troponin I (High Sensitivity): 263 ng/L (ref <18)
Troponin I (High Sensitivity): 311 ng/L (ref <18)
Troponin I (High Sensitivity): 392 ng/L (ref <18)

## 2023-07-02 MED ORDER — DILTIAZEM HCL-DEXTROSE 125-5 MG/125ML-% IV SOLN (PREMIX)
5.0000 mg/h | INTRAVENOUS | Status: DC
  Administered 2023-07-02: 5 mg/h via INTRAVENOUS
  Administered 2023-07-02: 15 mg/h via INTRAVENOUS
  Filled 2023-07-02 (×2): qty 125

## 2023-07-02 MED ORDER — CHLORHEXIDINE GLUCONATE CLOTH 2 % EX PADS
6.0000 | MEDICATED_PAD | Freq: Every day | CUTANEOUS | Status: DC
  Administered 2023-07-03: 6 via TOPICAL

## 2023-07-02 MED ORDER — FUROSEMIDE 40 MG PO TABS
40.0000 mg | ORAL_TABLET | Freq: Two times a day (BID) | ORAL | Status: DC
  Administered 2023-07-02 – 2023-07-03 (×2): 40 mg via ORAL
  Filled 2023-07-02 (×2): qty 1

## 2023-07-02 MED ORDER — ONDANSETRON HCL 4 MG PO TABS: 4.0000 mg | ORAL_TABLET | Freq: Four times a day (QID) | ORAL | Status: DC | PRN

## 2023-07-02 MED ORDER — ACETAMINOPHEN 325 MG PO TABS: 650.0000 mg | ORAL_TABLET | Freq: Four times a day (QID) | ORAL | Status: DC | PRN

## 2023-07-02 MED ORDER — POTASSIUM CHLORIDE CRYS ER 10 MEQ PO TBCR
10.0000 meq | EXTENDED_RELEASE_TABLET | Freq: Two times a day (BID) | ORAL | Status: DC
  Administered 2023-07-02 – 2023-07-03 (×2): 10 meq via ORAL
  Filled 2023-07-02 (×2): qty 1

## 2023-07-02 MED ORDER — DIGOXIN 0.25 MG/ML IJ SOLN
0.2500 mg | INTRAMUSCULAR | Status: DC
Start: 2023-07-02 — End: 2023-07-02
  Filled 2023-07-02 (×2): qty 2

## 2023-07-02 MED ORDER — METOPROLOL TARTRATE 50 MG PO TABS
50.0000 mg | ORAL_TABLET | Freq: Four times a day (QID) | ORAL | Status: DC
  Administered 2023-07-02 (×3): 50 mg via ORAL
  Filled 2023-07-02: qty 2
  Filled 2023-07-02 (×2): qty 1

## 2023-07-02 MED ORDER — ONDANSETRON HCL 4 MG/2ML IJ SOLN: 4.0000 mg | Freq: Four times a day (QID) | INTRAMUSCULAR | Status: DC | PRN

## 2023-07-02 MED ORDER — DILTIAZEM HCL 25 MG/5ML IV SOLN
10.0000 mg | Freq: Once | INTRAVENOUS | Status: AC
  Administered 2023-07-02: 10 mg via INTRAVENOUS
  Filled 2023-07-02: qty 5

## 2023-07-02 MED ORDER — APIXABAN 5 MG PO TABS
5.0000 mg | ORAL_TABLET | Freq: Two times a day (BID) | ORAL | Status: DC
  Administered 2023-07-02 – 2023-07-03 (×3): 5 mg via ORAL
  Filled 2023-07-02 (×3): qty 1

## 2023-07-02 NOTE — Assessment & Plan Note (Signed)
New onset atrial fibrillation w/ RVR  HR into 150s  Started on dilt gtt in ER  CHADsVASc Score 3-anticipate anticoagulation  2D ECHO  Cardiology consulted  Follow up recommendations

## 2023-07-02 NOTE — Assessment & Plan Note (Signed)
 Cont synthroid

## 2023-07-02 NOTE — ED Notes (Signed)
Cardiologist at bedside.  

## 2023-07-02 NOTE — Progress Notes (Signed)
       CROSS COVER NOTE  NAME: Robbert Langlinais MRN: [id number] DOB : August 13, 1971    Concern as stated by nurse / staff   Notified via chat from Janine Limbo RN that when weaning cardizem drip down patient went into v tach     Pertinent findings on chart review:   Assessment and  Interventions   Assessment: Patient alert and oriented. Nurse at bedside satted patient was sleeping during event Patient denies symptoms EKG reviewed by me atrial flutter 4:1 block Review of alarms on tele - no v tach found Plan: Continue to wean cardizem for desired rate 65 - 105       Donnie Mesa NP [facility] Cross Cover 7pm-7am - check amion for availability Pager [phone]

## 2023-07-02 NOTE — Assessment & Plan Note (Signed)
Cont statin

## 2023-07-02 NOTE — ED Notes (Signed)
Nurse Morrie Sheldon Aware of HR

## 2023-07-02 NOTE — H&P (Signed)
History and Physical    Patient: Gregory Crane ZDG:[id number] DOB: [birth year] DOA: 07/02/2023 DOS: the patient was seen and examined on 07/02/2023 PCP: No primary care provider on file.  Patient coming from:  Dialysis Center   Chief Complaint: No chief complaint on file.  HPI: Gregory Crane is a 52 y.o. male with medical history significant of obesity, type 2 Diabetes, ESRD on HD MWF, HTN, hypothyroidism, CVA, bells palsy presenting w/ atrial fibrillation w/ RVR, NSTEMI. Pt reports attempting to go to HD today. At facility, patient noted with heart rate into the 150s.  Was redirected to the ER for further evaluation.  Was unable to have dialysis session.  Patient denies any chest pain, shortness of breath or palpitations.  No abdominal pain, nausea or vomiting.  Has chronic left-sided weakness from prior CVA.  This has been relatively stable.  No fevers or chills.  No reported tobacco or alcohol use.  No reported illicit drug use.  Patient reports having a loop recorder present for roughly 3 years after CVA back.  Loop recorder removed last year per patient. Presented to the ER afebrile, heart rate into the 150s.  BP stable.  Satting well on room air.  White count 12, hemoglobin 14.5, platelets 205, troponin 263.  Creatinine 6.7, glucose 160.  Chest x-ray with stable cardiomegaly.  EKG with A-fib with RVR with heart rate in 130s. Review of Systems: As mentioned in the history of present illness. All other systems reviewed and are negative. Past Medical History:  Diagnosis Date   Allergy    Anemia    Bell's palsy    Diabetes mellitus without complication (HCC)    diet controlled   Hypertension    Hypothyroidism    Kidney stones    Pseudotumor cerebri    Stroke [facility])    Past Surgical History:  Procedure Laterality Date   COLONOSCOPY WITH PROPOFOL N/A 03/30/2020   Procedure: COLONOSCOPY WITH PROPOFOL;  Surgeon: Regis Bill, MD;  Location: [facility] ENDOSCOPY;  Service: Endoscopy;   Laterality: N/A;   LOOP RECORDER INSERTION N/A 01/27/2018   Procedure: LOOP RECORDER INSERTION;  Surgeon: Duke Salvia, MD;  Location: [facility] INVASIVE CV LAB;  Service: Cardiovascular;  Laterality: N/A;   LUMBAR PUNCTURE     as child   NO PAST SURGERIES     TEE WITHOUT CARDIOVERSION N/A 01/07/2018   Procedure: TRANSESOPHAGEAL ECHOCARDIOGRAM (TEE);  Surgeon: Antonieta Iba, MD;  Location: [facility] ORS;  Service: Cardiovascular;  Laterality: N/A;   Social History:  reports that he has never smoked. He has never used smokeless tobacco. He reports that he does not drink alcohol and does not use drugs.  No Known Allergies  Family History  Problem Relation Age of Onset   Breast cancer Mother    Diabetes Father    Diabetes Sister    Arthritis Maternal Grandmother    Diabetes Maternal Grandmother     Prior to Admission medications   Medication Sig Start Date End Date Taking? Authorizing Provider  amLODipine (NORVASC) 10 MG tablet TAKE 1 TABLET(10 MG) BY MOUTH DAILY 04/30/23  Yes Dale [place], MD  aspirin (ASPIRIN LOW DOSE) 81 MG EC tablet TAKE 1 TABLET(81 MG) BY MOUTH DAILY 01/23/21  Yes Dale [place], MD  dorzolamide-timolol (COSOPT) 2-0.5 % ophthalmic solution Place 1 drop into both eyes 2 (two) times daily. 05/08/23  Yes [provider]  hydrALAZINE (APRESOLINE) 50 MG tablet TAKE 1 TABLET(50 MG) BY MOUTH THREE TIMES DAILY Patient taking differently: Take  25 mg by mouth 2 (two) times daily. 02/17/23  Yes Dale [place], MD  levothyroxine (SYNTHROID) 175 MCG tablet TAKE 1 TABLET (175 MCG) BY MOUTH ONCE DAILY BEFORE BREAKFAST. 06/26/21  Yes Dale [place], MD  pregabalin (LYRICA) 100 MG capsule TAKE 1 CAPSULE(100 MG) BY MOUTH THREE TIMES DAILY Patient taking differently: Take 100 mg by mouth daily. 09/12/22  Yes Dale [place], MD  RENVELA 800 MG tablet Take 800 mg by mouth 3 (three) times daily. 04/23/22  Yes [provider]  rosuvastatin (CRESTOR) 5 MG tablet Take 5 mg by  mouth daily.   Yes [provider]  Accu-Chek FastClix Lancets MISC USE TO CHECK BLOOD SUGAR THREE TIMES DAILY AS DIRECTED 05/31/20   Glori Luis, MD  glucose blood (FREESTYLE LITE) test strip Check blood sugars twice a day (Dx. 250.02) 04/18/17   Dale [place], MD  insulin glargine-yfgn (SEMGLEE, YFGN,) 100 UNIT/ML injection INJECT 10- 15 UNITS INTO THE SKIN DAILY Patient not taking: Reported on 06/25/2023 01/10/23   Dale [place], MD  insulin lispro (HUMALOG) 100 UNIT/ML injection Inject 4-12 units three times daily before meals per sliding scale: If premeal sugar is <130, inject 4 units; if 131-180, inject 6 units; if 181-240, inject 8 units; if 241-300, inject 10 units, if  >300, inject 12 units (max daily dose 36 units) Patient not taking: Reported on 06/25/2023 02/05/23   Dale [place], MD  Insulin Syringe-Needle U-100 (INSULIN SYRINGE .5CC/30GX5/16") 30G X 5/16" 0.5 ML MISC USE AS DIRECTED WITH INSULIN Patient not taking: Reported on 06/25/2023 02/05/23   Dale [place], MD    Physical Exam: Vitals:   07/02/23 1000 07/02/23 1030 07/02/23 1100 07/02/23 1154  BP: (!) 131/92 (!) 137/92 (!) 141/87 (!) 129/98  Pulse: (!) 151 (!) 147 (!) 123 (!) 134  Resp: (!) 42 (!) 23 (!) 24   Temp:   98.2 F (36.8 C)   TempSrc:   Oral   SpO2: 94% 95% 94%   Weight:      Height:       Physical Exam Constitutional:      Appearance: He is obese.  HENT:     Head: Normocephalic and atraumatic.     Nose: Nose normal.     Mouth/Throat:     Mouth: Mucous membranes are moist.  Eyes:     Pupils: Pupils are equal, round, and reactive to light.  Cardiovascular:     Rate and Rhythm: Tachycardia present. Rhythm irregular.  Pulmonary:     Effort: Pulmonary effort is normal.  Abdominal:     General: Bowel sounds are normal.  Musculoskeletal:        General: Normal range of motion.     Cervical back: Normal range of motion.  Skin:    General: Skin is warm.  Psychiatric:        Mood  and Affect: Mood normal.     Data Reviewed:  There are no new results to review at this time.  DG Chest Port 1 View CLINICAL DATA:  Tachycardia  EXAM: PORTABLE CHEST 1 VIEW  COMPARISON:  Chest radiograph dated 04/28/2023  FINDINGS: Normal lung volumes. No focal consolidations. No pleural effusion or pneumothorax. Similar enlarged cardiomediastinal silhouette. No acute osseous abnormality.  IMPRESSION: 1. Similar cardiomegaly. 2.  No focal consolidations.  Electronically Signed   By: Agustin Cree M.D.   On: 07/02/2023 08:11  Lab Results  Component Value Date   WBC 11.8 (H) 07/02/2023   HGB 14.5 07/02/2023   HCT 42.8  07/02/2023   MCV 90.1 07/02/2023   PLT 205 07/02/2023   Last metabolic panel Lab Results  Component Value Date   GLUCOSE 160 (H) 07/02/2023   NA 139 07/02/2023   K 3.2 (L) 07/02/2023   CL 103 07/02/2023   CO2 24 07/02/2023   BUN 27 (H) 07/02/2023   CREATININE 6.71 (H) 07/02/2023   GFRNONAA 9 (L) 07/02/2023   CALCIUM 7.5 (L) 07/02/2023   PHOS 6.8 (H) 05/01/2023   PROT 6.5 07/02/2023   ALBUMIN 3.1 (L) 07/02/2023   LABGLOB 3.0 09/30/2013   AGRATIO 1.5 09/30/2013   BILITOT 0.8 07/02/2023   ALKPHOS 69 07/02/2023   AST 12 (L) 07/02/2023   ALT 8 07/02/2023   ANIONGAP 12 07/02/2023    Assessment and Plan: * Atrial fibrillation with RVR (HCC) New onset atrial fibrillation w/ RVR  HR into 150s  Started on dilt gtt in ER  CHADsVASc Score 3-anticipate anticoagulation  2D ECHO  Cardiology consulted  Follow up recommendations    NSTEMI (non-ST elevated myocardial infarction) (HCC) Trop 200s in setting of new onset atrial fibrillation w/ RVR No active chest pain  Suspect mild demand ischemia  Poor renal clearance of cardiac enzymes likely confounding issue  Trend trop  Follow up cardiology recommendations     ESRD on hemodialysis (HCC) Baseline ESRD on HD MWF  Missed HD today in setting of new onset afib w/ RVR  Per Dr. Vicente Males, case  already discussed w/ nephrology in house  Follow up nephrology recommendations   History of CVA (cerebrovascular accident) Remote history of CVA w/ chronic L sided weakness  Appears near baseline  Cont home regimen  Follow    Essential hypertension BP stable  Titrate home regimen    Hyperlipidemia Cont statin    Hypothyroidism Cont synthroid    Type 2 diabetes mellitus, with long-term current use of insulin (HCC) Blood sugar in 160s SSI  Monitor        Advance Care Planning:   Code Status: Full Code   Consults: Cardiology   Family Communication: No family at the bedside   Severity of Illness: The appropriate patient status for this patient is INPATIENT. Inpatient status is judged to be reasonable and necessary in order to provide the required intensity of service to ensure the patient's safety. The patient's presenting symptoms, physical exam findings, and initial radiographic and laboratory data in the context of their chronic comorbidities is felt to place them at high risk for further clinical deterioration. Furthermore, it is not anticipated that the patient will be medically stable for discharge from the hospital within 2 midnights of admission.   * I certify that at the point of admission it is my clinical judgment that the patient will require inpatient hospital care spanning beyond 2 midnights from the point of admission due to high intensity of service, high risk for further deterioration and high frequency of surveillance required.*  Author: Floydene Flock, MD 07/02/2023 12:44 PM  For on call review [email or web address].

## 2023-07-02 NOTE — ED Notes (Signed)
Notified Nurse Morrie Sheldon of pt HR

## 2023-07-02 NOTE — Assessment & Plan Note (Signed)
Blood sugar in 160s SSI  Monitor

## 2023-07-02 NOTE — ED Notes (Signed)
Attempt to titrate Cardizem gtt decrease from 15mg /hr to 12.5mg /hr pt HR remains controlled 74, after 30 minutes -  attempt to titrate Cardizem gtt from 12.5mg /hr to 10mg /hr results in rhythm change noted EKG obtained Hospitalist notified pt denies CP denies SOB is aox4 appears in nad

## 2023-07-02 NOTE — Progress Notes (Signed)
[facility] [place], [place] 07/02/23  Subjective:   Hospital day # 0  Patient known to our practice from outpatient dialysis.  He dialyzes at [facility]. He presents from the dialysis [facility] for heart rate in the 150s.  He did not receive his dialysis today. Otherwise he has not had any complaints.  No shortness of breath at present. He was evaluated by cardiology team and placed on IV Cardizem for atrial fibrillation with RVR.  He was started on Eliquis, metoprolol.    Objective:  Vital signs in last 24 hours:  Temp:  [98.2 F (36.8 C)-98.6 F (37 C)] 98.2 F (36.8 C) (11/06 1100) Pulse Rate:  [51-153] 74 (11/06 1430) Resp:  [9-42] 23 (11/06 1430) BP: (117-145)/(58-98) 117/58 (11/06 1430) SpO2:  [92 %-100 %] 97 % (11/06 1430) Weight:  [113.4 kg] 113.4 kg (11/06 0725)  Weight change:  Filed Weights   07/02/23 0725  Weight: 113.4 kg    Intake/Output:   No intake or output data in the 24 hours ending 07/02/23 1609   Physical Exam: General: No acute distress, laying in the bed  HEENT Moist oral mucous membranes  Pulm/lungs Normal breathing effort, clear  CVS/Heart Irregular  Abdomen:  Soft, nontender, nondistended  Extremities: Trace to 1+ edema bilaterally  Neurologic: Alert and oriented  Skin: No Acute rashes  Access: Left upper arm AV fistula       Basic Metabolic Panel:  Recent Labs  Lab 07/02/23 0717 07/02/23 1213  NA 139  --   K 3.2*  --   CL 103  --   CO2 24  --   GLUCOSE 160*  --   BUN 27*  --   CREATININE 6.71*  --   CALCIUM 7.5*  --   MG  --  2.1     CBC: Recent Labs  Lab 07/02/23 0717  WBC 11.8*  NEUTROABS 9.9*  HGB 14.5  HCT 42.8  MCV 90.1  PLT 205      Lab Results  Component Value Date   HEPBSAG NON REACTIVE 04/28/2023   HEPBSAB See Scanned report in [place] Link (A) 05/04/2022      Microbiology:  No results found for this or any previous visit (from the past 240  hour(s)).  Coagulation Studies: No results for input(s): "LABPROT", "INR" in the last 72 hours.  Urinalysis: No results for input(s): "COLORURINE", "LABSPEC", "PHURINE", "GLUCOSEU", "HGBUR", "BILIRUBINUR", "KETONESUR", "PROTEINUR", "UROBILINOGEN", "NITRITE", "LEUKOCYTESUR" in the last 72 hours.  Invalid input(s): "APPERANCEUR"    Imaging: ECHOCARDIOGRAM COMPLETE  Result Date: 07/02/2023    ECHOCARDIOGRAM REPORT   Patient Name:   Gregory [facility] Date of Exam: 07/02/2023 Medical Rec #:  [id number]      Height:       72.0 in Accession #:    [id number]     Weight:       250.0 lb Date of Birth:  10/12/1970     BSA:          2.343 m Patient Age:    51 years       BP:           129/98 mmHg Patient Gender: M              HR:           74 bpm. Exam Location:  [facility] Procedure: 2D Echo, Cardiac Doppler and Color Doppler Indications:     Atrial Fibrillation  History:  Patient has prior history of Echocardiogram examinations, most                  recent 06/05/2022. CAD, Stroke and TIA, Arrythmias:Atrial                  Fibrillation, Signs/Symptoms:Chest Pain; Risk                  Factors:Hypertension, Diabetes and Dyslipidemia. NSTEMI, ESRD                  on Dialysis.  Sonographer:     Mikki Harbor Referring Phys:  [id number] Antonieta Iba Diagnosing Phys: Julien Nordmann MD  Sonographer Comments: Patient is obese. IMPRESSIONS  1. Left ventricular ejection fraction, by estimation, is 50 to 55%. The left ventricle has low normal function. The left ventricle has no regional wall motion abnormalities. Left ventricular diastolic parameters are indeterminate.  2. Right ventricular systolic function is normal. The right ventricular size is normal. There is normal pulmonary artery systolic pressure. The estimated right ventricular systolic pressure is 19.0 mmHg.  3. The mitral valve is normal in structure. Mild mitral valve regurgitation. No evidence of mitral stenosis.  4. The aortic valve is tricuspid. There is  mild calcification of the aortic valve. Aortic valve regurgitation is not visualized. Aortic valve sclerosis/calcification is present, without any evidence of aortic stenosis.  5. The inferior vena cava is normal in size with greater than 50% respiratory variability, suggesting right atrial pressure of 3 mmHg. FINDINGS  Left Ventricle: Left ventricular ejection fraction, by estimation, is 50 to 55%. The left ventricle has low normal function. The left ventricle has no regional wall motion abnormalities. The left ventricular internal cavity size was normal in size. There is no left ventricular hypertrophy. Left ventricular diastolic parameters are indeterminate. Right Ventricle: The right ventricular size is normal. No increase in right ventricular wall thickness. Right ventricular systolic function is normal. There is normal pulmonary artery systolic pressure. The tricuspid regurgitant velocity is 2.00 m/s, and  with an assumed right atrial pressure of 3 mmHg, the estimated right ventricular systolic pressure is 19.0 mmHg. Left Atrium: Left atrial size was normal in size. Right Atrium: Right atrial size was normal in size. Pericardium: There is no evidence of pericardial effusion. Mitral Valve: The mitral valve is normal in structure. Mild mitral valve regurgitation. No evidence of mitral valve stenosis. MV peak gradient, 7.1 mmHg. The mean mitral valve gradient is 2.0 mmHg. Tricuspid Valve: The tricuspid valve is normal in structure. Tricuspid valve regurgitation is mild . No evidence of tricuspid stenosis. Aortic Valve: The aortic valve is tricuspid. There is mild calcification of the aortic valve. Aortic valve regurgitation is not visualized. Aortic valve sclerosis/calcification is present, without any evidence of aortic stenosis. Aortic valve mean gradient measures 3.0 mmHg. Aortic valve peak gradient measures 4.7 mmHg. Aortic valve area, by VTI measures 2.63 cm. Pulmonic Valve: The pulmonic valve was normal in  structure. Pulmonic valve regurgitation is not visualized. No evidence of pulmonic stenosis. Aorta: The aortic root is normal in size and structure. Venous: The inferior vena cava is normal in size with greater than 50% respiratory variability, suggesting right atrial pressure of 3 mmHg. IAS/Shunts: No atrial level shunt detected by color flow Doppler.  LEFT VENTRICLE PLAX 2D LVIDd:         5.70 cm   Diastology LVIDs:         3.90 cm   LV e' medial:  8.27 cm/s LV PW:         1.00 cm   LV E/e' medial:  12.7 LV IVS:        1.20 cm   LV e' lateral:   11.60 cm/s LVOT diam:     2.10 cm   LV E/e' lateral: 9.1 LV SV:         61 LV SV Index:   26 LVOT Area:     3.46 cm  RIGHT VENTRICLE RV Basal diam:  3.50 cm RV Mid diam:    3.10 cm RV S prime:     8.16 cm/s LEFT ATRIUM             Index        RIGHT ATRIUM           Index LA diam:        3.40 cm 1.45 cm/m   RA Area:     18.20 cm LA Vol (A2C):   85.9 ml 36.67 ml/m  RA Volume:   51.60 ml  22.03 ml/m LA Vol (A4C):   49.7 ml 21.22 ml/m LA Biplane Vol: 68.3 ml 29.16 ml/m  AORTIC VALVE                    PULMONIC VALVE AV Area (Vmax):    2.79 cm     PV Vmax:       0.76 m/s AV Area (Vmean):   2.52 cm     PV Peak grad:  2.3 mmHg AV Area (VTI):     2.63 cm AV Vmax:           108.00 cm/s AV Vmean:          76.700 cm/s AV VTI:            0.232 m AV Peak Grad:      4.7 mmHg AV Mean Grad:      3.0 mmHg LVOT Vmax:         87.00 cm/s LVOT Vmean:        55.700 cm/s LVOT VTI:          0.176 m LVOT/AV VTI ratio: 0.76  AORTA Ao Root diam: 3.90 cm Ao Asc diam:  3.60 cm MITRAL VALVE                TRICUSPID VALVE MV Area (PHT): 4.19 cm     TR Peak grad:   16.0 mmHg MV Area VTI:   1.93 cm     TR Vmax:        200.00 cm/s MV Peak grad:  7.1 mmHg MV Mean grad:  2.0 mmHg     SHUNTS MV Vmax:       1.33 m/s     Systemic VTI:  0.18 m MV Vmean:      69.3 cm/s    Systemic Diam: 2.10 cm MV Decel Time: 181 msec MV E velocity: 105.00 cm/s MV A velocity: 49.80 cm/s MV E/A ratio:  2.11 Julien Nordmann MD Electronically signed by Julien Nordmann MD Signature Date/Time: 07/02/2023/4:02:19 PM    Final    DG Chest Port 1 View  Result Date: 07/02/2023 CLINICAL DATA:  Tachycardia EXAM: PORTABLE CHEST 1 VIEW COMPARISON:  Chest radiograph dated 04/28/2023 FINDINGS: Normal lung volumes. No focal consolidations. No pleural effusion or pneumothorax. Similar enlarged cardiomediastinal silhouette. No acute osseous abnormality. IMPRESSION: 1. Similar cardiomegaly. 2.  No focal consolidations. Electronically Signed   By: Milus Height.D.  On: 07/02/2023 08:11     Medications:    diltiazem (CARDIZEM) infusion 15 mg/hr (07/02/23 1117)    apixaban  5 mg Oral BID   metoprolol tartrate  50 mg Oral Q6H   acetaminophen, ondansetron **OR** ondansetron (ZOFRAN) IV  Assessment/ Plan:  52 y.o. male with end-stage renal disease, history of cryptogenic stroke in 2019, grade 1 diastolic dysfunction, diabetes, hypertension, hypothyroidism, pseudotumor cerebri    admitted on 07/02/2023 for Atrial fibrillation with RVR (HCC) [I48.91]  #End-stage renal disease Plan for hemodialysis tomorrow to allow cardiovascular stability given new onset atrial fibrillation.  #Lower extremity edema Start furosemide.  #Hypokalemia Can be supplemented orally. We have requested 3 potassium dialysate as outpatient.. Check magnesium level in AM.  #Atrial fibrillation with RVR Currently on Cardizem drip. Cardiology evaluation ongoing.     LOS: 0 Quinetta Shilling 11/6/20244:09 PM  [facility] [address] [place], [place] [phone]  Note: This note was prepared with Dragon dictation. Any transcription errors are unintentional

## 2023-07-02 NOTE — ED Triage Notes (Signed)
Pt ems from dialysis center for heart rate in the 150's. Pt received no dialysis today. Pt MWF dialysis pt and has not miss any days. Other VS WNL for ems. CBG 161.

## 2023-07-02 NOTE — Assessment & Plan Note (Signed)
Baseline ESRD on HD MWF  Missed HD today in setting of new onset afib w/ RVR  Per Dr. Vicente Males, case already discussed w/ nephrology in house  Follow up nephrology recommendations

## 2023-07-02 NOTE — Assessment & Plan Note (Signed)
Trop 200s in setting of new onset atrial fibrillation w/ RVR No active chest pain  Suspect mild demand ischemia  Poor renal clearance of cardiac enzymes likely confounding issue  Trend trop  Follow up cardiology recommendations

## 2023-07-02 NOTE — ED Notes (Signed)
Assumed care of patient.

## 2023-07-02 NOTE — Progress Notes (Signed)
*  PRELIMINARY RESULTS* Echocardiogram 2D Echocardiogram has been performed.  Gregory [facility] 07/02/2023, 3:12 PM

## 2023-07-02 NOTE — ED Notes (Addendum)
[facility] ED [facility] Has took over tele Monitoring.

## 2023-07-02 NOTE — Consult Note (Signed)
Cardiology Consultation   Patient ID: Gregory [facility] MRN: [id number]; DOB: 01-Nov-1970  Admit date: 07/02/2023 Date of Consult: 07/02/2023  PCP:  No primary care provider on file.   [facility] Providers Cardiologist:  Gregory Ishikawa, MD   {   Patient Profile:   Gregory [facility] is a 52 y.o. male with a hx of DM2,ESRD on HD, HTN, CVA, hypothyroidism who is being seen 07/02/2023 for the evaluation of new onset Afib at the request of Dr. Vicente [facility].  History of Present Illness:   Gregory Cr[facility] had a cryptogenic CVA in 2019 and underwent ILR placement. Loop recorder showed  no Afib and loop was removed 12/2021. TTE 08/2017 showed  LVEF 60-65%, G1DD, normal RV function, no significant valvular disease. TEE in 12/2017 showed no intracardiac source of embolism, normal bubble study.  The patient was seen in 04/2022 for pre-op cardiovascular exam.due to inf q waves  echo was ordered. Echo showed LVEF 60-65%, no WMA, mild aortic root dilation  The patient presented to the ER 07/02/23 with tachycardia. He went to HD and HR was noted to be tachycardic and sent to the ER. He denies h/o afib/flutter. He occasionally has heart flutters but is overall asymptomatic. He denies chest pain or shortness of breath. No lower leg edema, orthopnea or pnd.   In the ER BP 133/90, RR 15,  HR 152bpm, normal O2. Labs showed BNP 1514, K3.2, AST 12, ALT 8. HS trop 263, WBC 11.8, Hgb 14.5. he was given IV dilt 10mg .EKG showed Aflutter with rates in the 130s. Patient was started on IV dilt and admitted.    Past Medical History:  Diagnosis Date   Allergy    Anemia    Bell's palsy    Diabetes mellitus without complication (HCC)    diet controlled   Hypertension    Hypothyroidism    Kidney stones    Pseudotumor cerebri    Stroke [facility])     Past Surgical History:  Procedure Laterality Date   COLONOSCOPY WITH PROPOFOL N/A 03/30/2020   Procedure: COLONOSCOPY WITH PROPOFOL;  Surgeon: Gregory Bill, MD;   Location: [facility] ENDOSCOPY;  Service: Endoscopy;  Laterality: N/A;   LOOP RECORDER INSERTION N/A 01/27/2018   Procedure: LOOP RECORDER INSERTION;  Surgeon: Gregory Salvia, MD;  Location: [facility] INVASIVE CV LAB;  Service: Cardiovascular;  Laterality: N/A;   LUMBAR PUNCTURE     as child   NO PAST SURGERIES     TEE WITHOUT CARDIOVERSION N/A 01/07/2018   Procedure: TRANSESOPHAGEAL ECHOCARDIOGRAM (TEE);  Surgeon: Gregory Iba, MD;  Location: [facility] ORS;  Service: Cardiovascular;  Laterality: N/A;     Home Medications:  Prior to Admission medications   Medication Sig Start Date End Date Taking? Authorizing Provider  Accu-Chek FastClix Lancets MISC USE TO CHECK BLOOD SUGAR THREE TIMES DAILY AS DIRECTED 05/31/20   Gregory Luis, MD  amLODipine (NORVASC) 10 MG tablet TAKE 1 TABLET(10 MG) BY MOUTH DAILY 04/30/23   Gregory Nespelem, MD  aspirin (ASPIRIN LOW DOSE) 81 MG EC tablet TAKE 1 TABLET(81 MG) BY MOUTH DAILY 01/23/21   Gregory [place], MD  dorzolamide-timolol (COSOPT) 2-0.5 % ophthalmic solution Place 1 drop into both eyes 2 (two) times daily. 05/08/23   [provider]  glucose blood (FREESTYLE LITE) test strip Check blood sugars twice a day (Dx. 250.02) 04/18/17   Gregory [place], MD  hydrALAZINE (APRESOLINE) 50 MG tablet TAKE 1 TABLET(50 MG) BY MOUTH THREE TIMES DAILY 02/17/23   Gregory Cr[facility],  [name], MD  insulin glargine-yfgn (SEMGLEE, YFGN,) 100 UNIT/ML injection INJECT 10- 15 UNITS INTO THE SKIN DAILY Patient not taking: Reported on 06/25/2023 01/10/23   Gregory Reynolds, MD  insulin lispro (HUMALOG) 100 UNIT/ML injection Inject 4-12 units three times daily before meals per sliding scale: If premeal sugar is <130, inject 4 units; if 131-180, inject 6 units; if 181-240, inject 8 units; if 241-300, inject 10 units, if  >300, inject 12 units (max daily dose 36 units) Patient not taking: Reported on 06/25/2023 02/05/23   Gregory Pettus, MD  Insulin Syringe-Needle U-100 (INSULIN SYRINGE .5CC/30GX5/16")  30G X 5/16" 0.5 ML MISC USE AS DIRECTED WITH INSULIN Patient not taking: Reported on 06/25/2023 02/05/23   Gregory [place], MD  levothyroxine (SYNTHROID) 175 MCG tablet TAKE 1 TABLET (175 MCG) BY MOUTH ONCE DAILY BEFORE BREAKFAST. 06/26/21   Gregory Perry, MD  pregabalin (LYRICA) 100 MG capsule TAKE 1 CAPSULE(100 MG) BY MOUTH THREE TIMES DAILY Patient taking differently: Take 100 mg by mouth daily. 09/12/22   Gregory [place], MD  RENVELA 800 MG tablet Take 800 mg by mouth 3 (three) times daily. 04/23/22   [provider]  rosuvastatin (CRESTOR) 5 MG tablet Take 5 mg by mouth daily.    [provider]    Inpatient Medications: Scheduled Meds:  Continuous Infusions:  diltiazem (CARDIZEM) infusion     PRN Meds:   Allergies:   No Known Allergies  Social History:   Social History   Socioeconomic History   Marital status: Widowed    Spouse name: Carollee Herter   Number of children: 0   Years of education: Not on file   Highest education level: Not on file  Occupational History   Occupation: disability  Tobacco Use   Smoking status: Never   Smokeless tobacco: Never  Vaping Use   Vaping status: Never Used  Substance and Sexual Activity   Alcohol use: No    Comment: last use early 2023   Drug use: No   Sexual activity: Yes  Other Topics Concern   Not on file  Social History Narrative   Wife deceased 04-29-20, has 2 step children, independent at baseline.   Social Determinants of Health   Financial Resource Strain: Low Risk  (06/25/2023)   Overall Financial Resource Strain (CARDIA)    Difficulty of Paying Living Expenses: Not hard at all  Food Insecurity: No Food Insecurity (06/25/2023)   Hunger Vital Sign    Worried About Running Out of Food in the Last Year: Never true    Ran Out of Food in the Last Year: Never true  Transportation Needs: No Transportation Needs (06/25/2023)   PRAPARE - Administrator, Civil Service (Medical): No    Lack of  Transportation (Non-Medical): No  Physical Activity: Insufficiently Active (06/25/2023)   Exercise Vital Sign    Days of Exercise per Week: 2 days    Minutes of Exercise per Session: 40 min  Stress: No Stress Concern Present (06/25/2023)   [employer] of Occupational Health - Occupational Stress Questionnaire    Feeling of Stress : Only a Gregory  Social Connections: Moderately Isolated (06/25/2023)   Social Connection and Isolation Panel [NHANES]    Frequency of Communication with Friends and Family: More than three times a week    Frequency of Social Gatherings with Friends and Family: Twice a week    Attends Religious Services: More than 4 times per year    Active Member of Clubs or Organizations: No  Attends Banker Meetings: Never    Marital Status: Widowed  Intimate Partner Violence: Not At Risk (06/25/2023)   Humiliation, Afraid, Rape, and Kick questionnaire    Fear of Current or Ex-Partner: No    Emotionally Abused: No    Physically Abused: No    Sexually Abused: No    Family History:    Family History  Problem Relation Age of Onset   Breast cancer Mother    Diabetes Father    Diabetes Sister    Arthritis Maternal Grandmother    Diabetes Maternal Grandmother      ROS:  Please see the history of present illness.   All other ROS reviewed and negative.     Physical Exam/Data:   Vitals:   07/02/23 0725 07/02/23 0730 07/02/23 0841 07/02/23 0900  BP:  (!) 133/90 117/86 (!) 144/86  Pulse:  (!) 152 (!) 153 (!) 149  Resp:  15 18 (!) 9  Temp: 98.6 F (37 C)     TempSrc: Oral     SpO2:  96% 97% 92%  Weight: 113.4 kg     Height: 6' (1.829 m)      No intake or output data in the 24 hours ending 07/02/23 0939    07/02/2023    7:25 AM 06/25/2023    2:10 PM 06/09/2023   12:54 PM  Last 3 Weights  Weight (lbs) 250 lb 250 lb 259 lb  Weight (kg) 113.399 kg 113.399 kg 117.482 kg     Body mass index is 33.91 kg/m.  General:  Well nourished, well  developed, in no acute distress HEENT: normal Neck: no JVD Vascular: No carotid bruits; Distal pulses 2+ bilaterally Cardiac:  normal S1, S2; Irreg Irreg, tachy; no murmur  Lungs:  clear to auscultation bilaterally, no wheezing, rhonchi or rales  Abd: soft, nontender, no hepatomegaly  Ext: no edema Musculoskeletal:  No deformities, BUE and BLE strength normal and equal Skin: warm and dry  Neuro:  CNs 2-12 intact, no focal abnormalities noted Psych:  Normal affect   EKG:  The EKG was personally reviewed and demonstrates:  Afib/flutter 2:1 AV block, 139bpm Telemetry:  Telemetry was personally reviewed and demonstrates:  Afib/flutter HR 130-150  Relevant CV Studies:  Echo 05/2022 1. Left ventricular ejection fraction, by estimation, is 60 to 65%. The  left ventricle has normal function. The left ventricle has no regional  wall motion abnormalities. Left ventricular diastolic parameters were  normal.   2. Right ventricular systolic function is normal. The right ventricular  size is normal.   3. The mitral valve is normal in structure. No evidence of mitral valve  regurgitation.   4. The aortic valve is tricuspid. Aortic valve regurgitation is not  visualized.   5. Aortic dilatation noted. There is mild dilatation of the aortic root  and of the ascending aorta, measuring 40 mm.   6. The inferior vena cava is normal in size with greater than 50%  respiratory variability, suggesting right atrial pressure of 3 mmHg.   Comparison(s): TTE 09/13/17 EF 60-65%, G1DD, normal RV function, no  significant valvular disease. TEE 12/2017 shows no intracardiac source of  embolism, normal bubble study.   Laboratory Data:  High Sensitivity Troponin:   Recent Labs  Lab 07/02/23 0717  TROPONINIHS 263*     Chemistry Recent Labs  Lab 07/02/23 0717  NA 139  K 3.2*  CL 103  CO2 24  GLUCOSE 160*  BUN 27*  CREATININE 6.71*  CALCIUM 7.5*  GFRNONAA 9*  ANIONGAP 12    Recent Labs  Lab  07/02/23 0717  PROT 6.5  ALBUMIN 3.1*  AST 12*  ALT 8  ALKPHOS 69  BILITOT 0.8   Lipids No results for input(s): "CHOL", "TRIG", "HDL", "LABVLDL", "LDLCALC", "CHOLHDL" in the last 168 hours.  Hematology Recent Labs  Lab 07/02/23 0717  WBC 11.8*  RBC 4.75  HGB 14.5  HCT 42.8  MCV 90.1  MCH 30.5  MCHC 33.9  RDW 12.6  PLT 205   Thyroid No results for input(s): "TSH", "FREET4" in the last 168 hours.  BNP Recent Labs  Lab 07/02/23 0717  BNP 1,514.8*    DDimer No results for input(s): "DDIMER" in the last 168 hours.   Radiology/Studies:  DG Chest Port 1 View  Result Date: 07/02/2023 CLINICAL DATA:  Tachycardia EXAM: PORTABLE CHEST 1 VIEW COMPARISON:  Chest radiograph dated 04/28/2023 FINDINGS: Normal lung volumes. No focal consolidations. No pleural effusion or pneumothorax. Similar enlarged cardiomediastinal silhouette. No acute osseous abnormality. IMPRESSION: 1. Similar cardiomegaly. 2.  No focal consolidations. Electronically Signed   By: Agustin Cree M.D.   On: 07/02/2023 08:11     Assessment and Plan:   New onset Afib/flutter with RVR - presented with tachycardia found to have new onset  afib/flutter with rates up to 150s - unclear chronicity of afib, but likely in the last few days - started on IV dilt - CHADSVASC  at least 4 (DM2 , Htn and CVA) - started on Eliquis 5mg BID - check echo when rates are better - check TSH, Mag and K - still in rapid afib, but he is comfortable - can increase IV dilt drip  Elevated troponin - HS trop 236 - likely demand ischemia in the setting of afib and underlying ESRD - no chest pain reported - no h/o CAD - trend troponin  ESRD - M,W,F per nephrology  Elevated BNP - volume management per HD  H/o CVA -CVA in 2019 with prior ILR placement with no detection of Afib/flutter - mild residual left side weakness - PTA ASA and Crestor  HTN - PTA Amlodipine 10mg  daily, hydral 50mg  TID held for IV dilt  For questions or  updates, please contact [facility] Please consult [email or web address] for contact info under    Signed, Brianny Soulliere David Stall, PA-C  07/02/2023 9:39 AM

## 2023-07-02 NOTE — Assessment & Plan Note (Signed)
Remote history of CVA w/ chronic L sided weakness  Appears near baseline  Cont home regimen  Follow

## 2023-07-02 NOTE — Assessment & Plan Note (Signed)
BP stable Titrate home regimen 

## 2023-07-02 NOTE — ED Notes (Signed)
Informed RN Tiffany via chat/ pt has bed assigned

## 2023-07-02 NOTE — ED Provider Notes (Signed)
[facility] Provider Note   Event Date/Time   First MD Initiated Contact with Patient 07/02/23 0710     (approximate) History  No chief complaint on file.  HPI Gregory Crane is a 52 y.o. male with a past medical history of end-stage renal disease on dialysis Tuesday/Thursday/Saturday who presents from dialysis clinic today after he was found to be tachycardic to 150s.  Patient states that he did have an episode of presyncope in the shower this morning but otherwise has had no symptoms from this tachycardia. ROS: Patient currently denies any vision changes, tinnitus, difficulty speaking, facial droop, sore throat, palpitations, chest pain, shortness of breath, abdominal pain, nausea/vomiting/diarrhea, dysuria, or weakness/numbness/paresthesias in any extremity   Physical Exam  Triage Vital Signs: ED Triage Vitals  Encounter Vitals Group     BP      Systolic BP Percentile      Diastolic BP Percentile      Pulse      Resp      Temp      Temp src      SpO2      Weight      Height      Head Circumference      Peak Flow      Pain Score      Pain Loc      Pain Education      Exclude from Growth Chart    Most recent vital signs: Vitals:   07/02/23 0900 07/02/23 0930  BP: (!) 144/86 131/83  Pulse: (!) 149 (!) 141  Resp: (!) 9 15  Temp:    SpO2: 92% 95%   General: Awake, oriented x4. CV:  Good peripheral perfusion.  Resp:  Normal effort.  Abd:  No distention.  Other:  Middle-aged overweight Caucasian male resting comfortably in no acute distress ED Results / Procedures / Treatments  Labs (all labs ordered are listed, but only abnormal results are displayed) Labs Reviewed  COMPREHENSIVE METABOLIC PANEL - Abnormal; Notable for the following components:      Result Value   Potassium 3.2 (*)    Glucose, Bld 160 (*)    BUN 27 (*)    Creatinine, Ser 6.71 (*)    Calcium 7.5 (*)    Albumin 3.1 (*)    AST 12 (*)    GFR, Estimated 9 (*)    All other  components within normal limits  CBC WITH DIFFERENTIAL/PLATELET - Abnormal; Notable for the following components:   WBC 11.8 (*)    Neutro Abs 9.9 (*)    All other components within normal limits  BRAIN NATRIURETIC PEPTIDE - Abnormal; Notable for the following components:   B Natriuretic Peptide 1,514.8 (*)    All other components within normal limits  TROPONIN I (HIGH SENSITIVITY) - Abnormal; Notable for the following components:   Troponin I (High Sensitivity) 263 (*)    All other components within normal limits  MAGNESIUM  TSH  TROPONIN I (HIGH SENSITIVITY)   EKG ED ECG REPORT I, Merwyn Katos, the attending physician, personally viewed and interpreted this ECG. Date: 07/02/2023 EKG Time: 0716 Rate: 151 Rhythm: Tachycardic rhythm without discernible P waves QRS Axis: normal Intervals: normal ST/T Wave abnormalities: normal Narrative Interpretation: Sinus or ectopic atrial tachycardia.  No evidence of acute ischemia RADIOLOGY ED MD interpretation: Single portable view chest x-ray shows similar cardiomegaly to previous with no focal consolidations -Agree with radiology assessment Official radiology report(s): DG Chest [facility]  Result Date: 07/02/2023 CLINICAL DATA:  Tachycardia EXAM: PORTABLE CHEST 1 VIEW COMPARISON:  Chest radiograph dated 04/28/2023 FINDINGS: Normal lung volumes. No focal consolidations. No pleural effusion or pneumothorax. Similar enlarged cardiomediastinal silhouette. No acute osseous abnormality. IMPRESSION: 1. Similar cardiomegaly. 2.  No focal consolidations. Electronically Signed   By: Agustin Cree M.D.   On: 07/02/2023 08:11   PROCEDURES: Critical Care performed: Yes, see critical care procedure note(s) .1-3 Lead EKG Interpretation  Performed by: Merwyn Katos, MD Authorized by: Merwyn Katos, MD     Interpretation: abnormal     ECG rate:  145   ECG rate assessment: tachycardic     Rhythm: atrial fibrillation     Ectopy: none      Conduction: normal   CRITICAL CARE Performed by: Merwyn Katos  Total critical care time: 37 minutes  Critical care time was exclusive of separately billable procedures and treating other patients.  Critical care was necessary to treat or prevent imminent or life-threatening deterioration.  Critical care was time spent personally by me on the following activities: development of treatment plan with patient and/or surrogate as well as nursing, discussions with consultants, evaluation of patient's response to treatment, examination of patient, obtaining history from patient or surrogate, ordering and performing treatments and interventions, ordering and review of laboratory studies, ordering and review of radiographic studies, pulse oximetry and re-evaluation of patient's condition.  MEDICATIONS ORDERED IN ED: Medications  diltiazem (CARDIZEM) 125 mg in dextrose 5% 125 mL (1 mg/mL) infusion (10 mg/hr Intravenous Rate/Dose Change 07/02/23 1012)  apixaban (ELIQUIS) tablet 5 mg (has no administration in time range)  diltiazem (CARDIZEM) injection 10 mg (10 mg Intravenous Given 07/02/23 0905)   IMPRESSION / MDM / ASSESSMENT AND PLAN / ED COURSE  I reviewed the triage vital signs and the nursing notes.                             The patient is on the cardiac monitor to evaluate for evidence of arrhythmia and/or significant heart rate changes. Patient's presentation is most consistent with acute presentation with potential threat to life or bodily function. + atrial fibrillation w/ RVR DDx: Pneumothorax, Pneumonia, Pulmonary Embolus, Tamponade, ACS, Thyrotoxicosis.  No history or evidence decompensated heart failure. Given their history and exam it is likely this patient is unlikely to spontaneously revert to a rate controlled rhythm and necessitates a thorough workup for their arrhythmia. Workup: ECG, CXR, CBC, BMP, UA, Troponin, BNP Interventions: Defer Cardioversion (uncertain historical  reliability with time of onset, increased risk of thromboembolic stroke).  Start diltiazem bolus and drip  Disposition: Admit   FINAL CLINICAL IMPRESSION(S) / ED DIAGNOSES   Final diagnoses:  Atrial fibrillation with rapid ventricular response (HCC)   Rx / DC Orders   ED Discharge Orders     None      Note:  This document was prepared using Dragon voice recognition software and may include unintentional dictation errors.   Merwyn Katos, MD 07/02/23 [phone]

## 2023-07-03 ENCOUNTER — Other Ambulatory Visit (HOSPITAL_COMMUNITY): Payer: Self-pay

## 2023-07-03 ENCOUNTER — Ambulatory Visit: Payer: Medicare Other

## 2023-07-03 DIAGNOSIS — I4891 Unspecified atrial fibrillation: Secondary | ICD-10-CM | POA: Diagnosis not present

## 2023-07-03 DIAGNOSIS — N186 End stage renal disease: Secondary | ICD-10-CM | POA: Diagnosis not present

## 2023-07-03 DIAGNOSIS — Z794 Long term (current) use of insulin: Secondary | ICD-10-CM

## 2023-07-03 DIAGNOSIS — E1122 Type 2 diabetes mellitus with diabetic chronic kidney disease: Secondary | ICD-10-CM

## 2023-07-03 DIAGNOSIS — E782 Mixed hyperlipidemia: Secondary | ICD-10-CM

## 2023-07-03 DIAGNOSIS — R7989 Other specified abnormal findings of blood chemistry: Secondary | ICD-10-CM | POA: Diagnosis not present

## 2023-07-03 LAB — COMPREHENSIVE METABOLIC PANEL
ALT: 8 U/L (ref 0–44)
AST: 11 U/L — ABNORMAL LOW (ref 15–41)
Albumin: 3.2 g/dL — ABNORMAL LOW (ref 3.5–5.0)
Alkaline Phosphatase: 69 U/L (ref 38–126)
Anion gap: 10 (ref 5–15)
BUN: 34 mg/dL — ABNORMAL HIGH (ref 6–20)
CO2: 28 mmol/L (ref 22–32)
Calcium: 7.8 mg/dL — ABNORMAL LOW (ref 8.9–10.3)
Chloride: 101 mmol/L (ref 98–111)
Creatinine, Ser: 7.83 mg/dL — ABNORMAL HIGH (ref 0.61–1.24)
GFR, Estimated: 8 mL/min — ABNORMAL LOW (ref >60)
Glucose, Bld: 155 mg/dL — ABNORMAL HIGH (ref 70–99)
Potassium: 3.6 mmol/L (ref 3.5–5.1)
Sodium: 139 mmol/L (ref 135–145)
Total Bilirubin: 0.8 mg/dL (ref <1.2)
Total Protein: 6.8 g/dL (ref 6.5–8.1)

## 2023-07-03 LAB — HIV ANTIBODY (ROUTINE TESTING W REFLEX): HIV Screen 4th Generation wRfx: NONREACTIVE

## 2023-07-03 LAB — CBC
HCT: 41.2 % (ref 39.0–52.0)
Hemoglobin: 13.9 g/dL (ref 13.0–17.0)
MCH: 30.6 pg (ref 26.0–34.0)
MCHC: 33.7 g/dL (ref 30.0–36.0)
MCV: 90.7 fL (ref 80.0–100.0)
Platelets: 270 10*3/uL (ref 150–400)
RBC: 4.54 MIL/uL (ref 4.22–5.81)
RDW: 12.7 % (ref 11.5–15.5)
WBC: 6.6 10*3/uL (ref 4.0–10.5)
nRBC: 0 % (ref 0.0–0.2)

## 2023-07-03 LAB — MAGNESIUM: Magnesium: 2.2 mg/dL (ref 1.7–2.4)

## 2023-07-03 LAB — MRSA NEXT GEN BY PCR, NASAL: MRSA by PCR Next Gen: NOT DETECTED

## 2023-07-03 MED ORDER — NEPRO/CARBSTEADY PO LIQD: 237.0000 mL | ORAL | Status: DC | PRN

## 2023-07-03 MED ORDER — HEPARIN SODIUM (PORCINE) 1000 UNIT/ML DIALYSIS: 1000.0000 [IU] | INTRAMUSCULAR | Status: DC | PRN

## 2023-07-03 MED ORDER — HYDRALAZINE HCL 50 MG PO TABS: 25.0000 mg | ORAL_TABLET | Freq: Two times a day (BID) | ORAL | Status: DC

## 2023-07-03 MED ORDER — METOPROLOL TARTRATE 25 MG PO TABS: 25.0000 mg | ORAL_TABLET | Freq: Two times a day (BID) | ORAL | Status: DC

## 2023-07-03 MED ORDER — AMLODIPINE BESYLATE 10 MG PO TABS: 5.0000 mg | ORAL_TABLET | Freq: Every day | ORAL | Status: DC

## 2023-07-03 MED ORDER — LIDOCAINE-PRILOCAINE 2.5-2.5 % EX CREA
1.0000 | TOPICAL_CREAM | CUTANEOUS | Status: DC | PRN
Start: 2023-07-03 — End: 2023-07-03

## 2023-07-03 MED ORDER — FUROSEMIDE 40 MG PO TABS: 40.0000 mg | ORAL_TABLET | Freq: Two times a day (BID) | ORAL | 2 refills | Status: DC

## 2023-07-03 MED ORDER — LIDOCAINE HCL (PF) 1 % IJ SOLN: 5.0000 mL | INTRAMUSCULAR | Status: DC | PRN

## 2023-07-03 MED ORDER — PREGABALIN 100 MG PO CAPS: 100.0000 mg | ORAL_CAPSULE | Freq: Every day | ORAL | Status: DC

## 2023-07-03 MED ORDER — PENTAFLUOROPROP-TETRAFLUOROETH EX AERO: 1.0000 | INHALATION_SPRAY | CUTANEOUS | Status: DC | PRN

## 2023-07-03 MED ORDER — APIXABAN 5 MG PO TABS: 5.0000 mg | ORAL_TABLET | Freq: Two times a day (BID) | ORAL | 2 refills | Status: DC

## 2023-07-03 MED ORDER — METOPROLOL TARTRATE 25 MG PO TABS: 25.0000 mg | ORAL_TABLET | Freq: Two times a day (BID) | ORAL | 2 refills | Status: DC

## 2023-07-03 NOTE — TOC CM/SW Note (Signed)
Transition of Care [facility]) - Inpatient Brief Assessment   Patient Details  Name: Gregory Crane MRN: [id number] Date of Birth: May 06, 1971  [facility]) CM/SW Contact:    Margarito Liner, LCSW Phone Number: 07/03/2023, 1:04 PM   Clinical Narrative: Patient has orders to discharge home today. Chart reviewed. No TOC needs identified. He will drive himself home. CSW signing off.  Transition of Care Asessment: Insurance and Status: Insurance coverage has been reviewed Patient has primary care physician: Yes Home environment has been reviewed: Single family home Prior level of function:: Not documented Prior/Current Home Services: No current home services Social Determinants of Health Reivew: SDOH reviewed no interventions necessary Readmission risk has been reviewed: Yes Transition of care needs: no transition of care needs at this time

## 2023-07-03 NOTE — Plan of Care (Signed)
  Problem: Education: Goal: Knowledge of disease or condition will improve Outcome: Progressing   Problem: Clinical Measurements: Goal: Ability to maintain clinical measurements within normal limits will improve Outcome: Progressing   Problem: Clinical Measurements: Goal: Respiratory complications will improve Outcome: Progressing   Problem: Clinical Measurements: Goal: Cardiovascular complication will be avoided Outcome: Progressing   Problem: Safety: Goal: Ability to remain free from injury will improve Outcome: Progressing

## 2023-07-03 NOTE — Discharge Summary (Signed)
Physician Discharge Summary   Gregory Crane  male DOB: 1971/01/18  NWG:[id number]  PCP: No primary care provider on file.  Admit date: 07/02/2023 Discharge date: 07/03/2023  Admitted From: home Disposition:  home CODE STATUS: Full code  Discharge Instructions     Amb referral to AFIB Clinic   Complete by: As directed    Diet - low sodium heart healthy   Complete by: As directed    No wound care   Complete by: As directed       Crane Course:  For full details, please see H&P, progress notes, consult notes and ancillary notes.  Briefly,  Gregory Crane is a 52 y.o. male with medical history significant of obesity, type 2 Diabetes, ESRD on HD MWF, HTN, hypothyroidism, CVA, bells palsy presenting w/ atrial fibrillation w/ RVR.   At dialysis center, patient noted with heart rate into the 150s.  Was redirected to the ER for further evaluation.   * Atrial fibrillation with RVR (HCC) New onset, HR into 150s.  Started on dilt gtt in ER.  Metoprolol tartrate 50 every 6 hours added.  Cardiology consulted.  Converted to normal sinus rhythm 1 AM next day, rate in the 50s.  metoprolol tartrate decreased down to 25 twice daily given bradycardia.  Started on Eliquis.  Echocardiogram with normal LV function.       NSTEMI, ruled out Trop elevation due to demand ischemia Trop 200s in setting of new onset atrial fibrillation w/ RVR No active chest pain  Suspect mild demand ischemia  Poor renal clearance of cardiac enzymes likely confounding issue    ESRD on hemodialysis (HCC) MWF Hemodialysis deferred due to stable renal indices. Patient cleared to discharge from renal stance and encouraged to return to outpatient clinic the next day for scheduled treatment.  --started on oral lasix 40 mg BID by nephro   History of CVA (cerebrovascular accident) Remote history of CVA w/ chronic L sided weakness.  At baseline. Cont home ASA and statin.   Essential hypertension --started on lasix  by nephro and Lopressor by cardio. --cont home hydralazine 25 mg BID. --reduce amlodipine from 10 to 5 mg daily by cardio   Hyperlipidemia Cont statin    Hypothyroidism Cont synthroid    Type 2 diabetes mellitus --recent A1c 5.8.  Pt no longer on insulin PTA.   Unless noted above, medications under "STOP" list are ones pt was not taking PTA.  Discharge Diagnoses:  Principal Problem:   Atrial fibrillation with RVR (HCC) Active Problems:   NSTEMI (non-ST elevated myocardial infarction) (HCC)   ESRD on hemodialysis (HCC)   Essential hypertension   History of CVA (cerebrovascular accident)   Hyperlipidemia   Hypothyroidism   Type 2 diabetes mellitus, with long-term current use of insulin (HCC)   Atrial fibrillation with rapid ventricular response (HCC)   30 Day Unplanned Readmission Risk Score    Flowsheet Row ED to Hosp-Admission (Current) from 07/02/2023 in [facility]  30 Day Unplanned Readmission Risk Score (%) 17.06 Filed at 07/03/2023 0801       This score is the patient's risk of an unplanned readmission within 30 days of being discharged (0 -100%). The score is based on dignosis, age, lab data, medications, orders, and past utilization.   Low:  0-14.9   Medium: 15-21.9   High: 22-29.9   Extreme: 30 and above         Discharge Instructions:  Allergies as of 07/03/2023   No  Known Allergies      Medication List     STOP taking these medications    Accu-Chek FastClix Lancets Misc   glucose blood test strip Commonly known as: FREESTYLE LITE   insulin lispro 100 UNIT/ML injection Commonly known as: HUMALOG   INSULIN SYRINGE .5CC/30GX5/16" 30G X 5/16" 0.5 ML Misc   Semglee (yfgn) 100 UNIT/ML injection Generic drug: insulin glargine-yfgn       TAKE these medications    amLODipine 10 MG tablet Commonly known as: NORVASC Take 0.5 tablets (5 mg total) by mouth daily. Reduced from 10 mg. What changed: See the new  instructions.   apixaban 5 MG Tabs tablet Commonly known as: ELIQUIS Take 1 tablet (5 mg total) by mouth 2 (two) times daily.   aspirin EC 81 MG tablet Commonly known as: Aspirin Low Dose TAKE 1 TABLET(81 MG) BY MOUTH DAILY   dorzolamide-timolol 2-0.5 % ophthalmic solution Commonly known as: COSOPT Place 1 drop into both eyes 2 (two) times daily.   furosemide 40 MG tablet Commonly known as: LASIX Take 1 tablet (40 mg total) by mouth 2 (two) times daily.   hydrALAZINE 50 MG tablet Commonly known as: APRESOLINE Take 0.5 tablets (25 mg total) by mouth 2 (two) times daily. Home med. What changed: See the new instructions.   levothyroxine 175 MCG tablet Commonly known as: SYNTHROID TAKE 1 TABLET (175 MCG) BY MOUTH ONCE DAILY BEFORE BREAKFAST.   metoprolol tartrate 25 MG tablet Commonly known as: LOPRESSOR Take 1 tablet (25 mg total) by mouth 2 (two) times daily.   pregabalin 100 MG capsule Commonly known as: LYRICA Take 1 capsule (100 mg total) by mouth daily. Home med. What changed: See the new instructions.   Renvela 800 MG tablet Generic drug: sevelamer carbonate Take 800 mg by mouth 3 (three) times daily.   rosuvastatin 5 MG tablet Commonly known as: CRESTOR Take 5 mg by mouth daily.         Follow-up Information     Antonieta Iba, MD Follow up in 1 week(s).   Specialty: Cardiology Contact information: [address] [place] [address] [phone]                 No Known Allergies   The results of significant diagnostics from this hospitalization (including imaging, microbiology, ancillary and laboratory) are listed below for reference.   Consultations:   Procedures/Studies: ECHOCARDIOGRAM COMPLETE  Result Date: 07/02/2023    ECHOCARDIOGRAM REPORT   Patient Name:   Gregory Crane Date of Exam: 07/02/2023 Medical Rec #:  [id number]      Height:       72.0 in Accession #:    [id number]     Weight:       250.0 lb Date of  Birth:  1971-06-28     BSA:          2.343 m Patient Age:    51 years       BP:           129/98 mmHg Patient Gender: M              HR:           74 bpm. Exam Location:  [facility] Procedure: 2D Echo, Cardiac Doppler and Color Doppler Indications:     Atrial Fibrillation  History:         Patient has prior history of Echocardiogram examinations, most  recent 06/05/2022. CAD, Stroke and TIA, Arrythmias:Atrial                  Fibrillation, Signs/Symptoms:Chest Pain; Risk                  Factors:Hypertension, Diabetes and Dyslipidemia. NSTEMI, ESRD                  on Dialysis.  Sonographer:     Mikki Harbor Referring Phys:  [id number] Antonieta Iba Diagnosing Phys: Julien Nordmann MD  Sonographer Comments: Patient is obese. IMPRESSIONS  1. Left ventricular ejection fraction, by estimation, is 50 to 55%. The left ventricle has low normal function. The left ventricle has no regional wall motion abnormalities. Left ventricular diastolic parameters are indeterminate.  2. Right ventricular systolic function is normal. The right ventricular size is normal. There is normal pulmonary artery systolic pressure. The estimated right ventricular systolic pressure is 19.0 mmHg.  3. The mitral valve is normal in structure. Mild mitral valve regurgitation. No evidence of mitral stenosis.  4. The aortic valve is tricuspid. There is mild calcification of the aortic valve. Aortic valve regurgitation is not visualized. Aortic valve sclerosis/calcification is present, without any evidence of aortic stenosis.  5. The inferior vena cava is normal in size with greater than 50% respiratory variability, suggesting right atrial pressure of 3 mmHg. FINDINGS  Left Ventricle: Left ventricular ejection fraction, by estimation, is 50 to 55%. The left ventricle has low normal function. The left ventricle has no regional wall motion abnormalities. The left ventricular internal cavity size was normal in size. There is no left ventricular  hypertrophy. Left ventricular diastolic parameters are indeterminate. Right Ventricle: The right ventricular size is normal. No increase in right ventricular wall thickness. Right ventricular systolic function is normal. There is normal pulmonary artery systolic pressure. The tricuspid regurgitant velocity is 2.00 m/s, and  with an assumed right atrial pressure of 3 mmHg, the estimated right ventricular systolic pressure is 19.0 mmHg. Left Atrium: Left atrial size was normal in size. Right Atrium: Right atrial size was normal in size. Pericardium: There is no evidence of pericardial effusion. Mitral Valve: The mitral valve is normal in structure. Mild mitral valve regurgitation. No evidence of mitral valve stenosis. MV peak gradient, 7.1 mmHg. The mean mitral valve gradient is 2.0 mmHg. Tricuspid Valve: The tricuspid valve is normal in structure. Tricuspid valve regurgitation is mild . No evidence of tricuspid stenosis. Aortic Valve: The aortic valve is tricuspid. There is mild calcification of the aortic valve. Aortic valve regurgitation is not visualized. Aortic valve sclerosis/calcification is present, without any evidence of aortic stenosis. Aortic valve mean gradient measures 3.0 mmHg. Aortic valve peak gradient measures 4.7 mmHg. Aortic valve area, by VTI measures 2.63 cm. Pulmonic Valve: The pulmonic valve was normal in structure. Pulmonic valve regurgitation is not visualized. No evidence of pulmonic stenosis. Aorta: The aortic root is normal in size and structure. Venous: The inferior vena cava is normal in size with greater than 50% respiratory variability, suggesting right atrial pressure of 3 mmHg. IAS/Shunts: No atrial level shunt detected by color flow Doppler.  LEFT VENTRICLE PLAX 2D LVIDd:         5.70 cm   Diastology LVIDs:         3.90 cm   LV e' medial:    8.27 cm/s LV PW:         1.00 cm   LV E/e' medial:  12.7 LV IVS:  1.20 cm   LV e' lateral:   11.60 cm/s LVOT diam:     2.10 cm   LV E/e'  lateral: 9.1 LV SV:         61 LV SV Index:   26 LVOT Area:     3.46 cm  RIGHT VENTRICLE RV Basal diam:  3.50 cm RV Mid diam:    3.10 cm RV S prime:     8.16 cm/s LEFT ATRIUM             Index        RIGHT ATRIUM           Index LA diam:        3.40 cm 1.45 cm/m   RA Area:     18.20 cm LA Vol (A2C):   85.9 ml 36.67 ml/m  RA Volume:   51.60 ml  22.03 ml/m LA Vol (A4C):   49.7 ml 21.22 ml/m LA Biplane Vol: 68.3 ml 29.16 ml/m  AORTIC VALVE                    PULMONIC VALVE AV Area (Vmax):    2.79 cm     PV Vmax:       0.76 m/s AV Area (Vmean):   2.52 cm     PV Peak grad:  2.3 mmHg AV Area (VTI):     2.63 cm AV Vmax:           108.00 cm/s AV Vmean:          76.700 cm/s AV VTI:            0.232 m AV Peak Grad:      4.7 mmHg AV Mean Grad:      3.0 mmHg LVOT Vmax:         87.00 cm/s LVOT Vmean:        55.700 cm/s LVOT VTI:          0.176 m LVOT/AV VTI ratio: 0.76  AORTA Ao Root diam: 3.90 cm Ao Asc diam:  3.60 cm MITRAL VALVE                TRICUSPID VALVE MV Area (PHT): 4.19 cm     TR Peak grad:   16.0 mmHg MV Area VTI:   1.93 cm     TR Vmax:        200.00 cm/s MV Peak grad:  7.1 mmHg MV Mean grad:  2.0 mmHg     SHUNTS MV Vmax:       1.33 m/s     Systemic VTI:  0.18 m MV Vmean:      69.3 cm/s    Systemic Diam: 2.10 cm MV Decel Time: 181 msec MV E velocity: 105.00 cm/s MV A velocity: 49.80 cm/s MV E/A ratio:  2.11 Julien Nordmann MD Electronically signed by Julien Nordmann MD Signature Date/Time: 07/02/2023/4:02:19 PM    Final    DG Chest Port 1 View  Result Date: 07/02/2023 CLINICAL DATA:  Tachycardia EXAM: PORTABLE CHEST 1 VIEW COMPARISON:  Chest radiograph dated 04/28/2023 FINDINGS: Normal lung volumes. No focal consolidations. No pleural effusion or pneumothorax. Similar enlarged cardiomediastinal silhouette. No acute osseous abnormality. IMPRESSION: 1. Similar cardiomegaly. 2.  No focal consolidations. Electronically Signed   By: Agustin Cree M.D.   On: 07/02/2023 08:11      Labs: BNP (last 3  results) Recent Labs    04/28/23 1749 07/02/23 0717  BNP 707.7* 1,514.8*   Basic  Metabolic Panel: Recent Labs  Lab 07/02/23 0717 07/02/23 1213 07/03/23 0422  NA 139  --  139  K 3.2*  --  3.6  CL 103  --  101  CO2 24  --  28  GLUCOSE 160*  --  155*  BUN 27*  --  34*  CREATININE 6.71*  --  7.83*  CALCIUM 7.5*  --  7.8*  MG  --  2.1 2.2   Liver Function Tests: Recent Labs  Lab 07/02/23 0717 07/03/23 0422  AST 12* 11*  ALT 8 8  ALKPHOS 69 69  BILITOT 0.8 0.8  PROT 6.5 6.8  ALBUMIN 3.1* 3.2*   No results for input(s): "LIPASE", "AMYLASE" in the last 168 hours. No results for input(s): "AMMONIA" in the last 168 hours. CBC: Recent Labs  Lab 07/02/23 0717 07/03/23 0422  WBC 11.8* 6.6  NEUTROABS 9.9*  --   HGB 14.5 13.9  HCT 42.8 41.2  MCV 90.1 90.7  PLT 205 270   Cardiac Enzymes: No results for input(s): "CKTOTAL", "CKMB", "CKMBINDEX", "TROPONINI" in the last 168 hours. BNP: Invalid input(s): "POCBNP" CBG: Recent Labs  Lab 07/02/23 2201  GLUCAP 144*   D-Dimer No results for input(s): "DDIMER" in the last 72 hours. Hgb A1c No results for input(s): "HGBA1C" in the last 72 hours. Lipid Profile No results for input(s): "CHOL", "HDL", "LDLCALC", "TRIG", "CHOLHDL", "LDLDIRECT" in the last 72 hours. Thyroid function studies Recent Labs    07/02/23 1213  TSH 8.718*   Anemia work up No results for input(s): "VITAMINB12", "FOLATE", "FERRITIN", "TIBC", "IRON", "RETICCTPCT" in the last 72 hours. Urinalysis    Component Value Date/Time   COLORURINE YELLOW (A) 08/22/2022 0042   APPEARANCEUR Clear 10/30/2022 1446   LABSPEC 1.012 08/22/2022 0042   LABSPEC 1.033 01/27/2014 0247   PHURINE 5.0 08/22/2022 0042   GLUCOSEU Trace (A) 10/30/2022 1446   GLUCOSEU 100 (A) 05/08/2022 1101   HGBUR SMALL (A) 08/22/2022 0042   BILIRUBINUR Negative 10/30/2022 1446   BILIRUBINUR Negative 01/27/2014 0247   KETONESUR NEGATIVE 08/22/2022 0042   PROTEINUR 3+ (A) 10/30/2022  1446   PROTEINUR >=300 (A) 08/22/2022 0042   UROBILINOGEN 0.2 05/08/2022 1101   NITRITE Negative 10/30/2022 1446   NITRITE NEGATIVE 08/22/2022 0042   LEUKOCYTESUR Negative 10/30/2022 1446   LEUKOCYTESUR NEGATIVE 08/22/2022 0042   LEUKOCYTESUR Negative 01/27/2014 0247   Sepsis Labs Recent Labs  Lab 07/02/23 0717 07/03/23 0422  WBC 11.8* 6.6   Microbiology Recent Results (from the past 240 hour(s))  MRSA Next Gen by PCR, Nasal     Status: None   Collection Time: 07/03/23  2:35 AM   Specimen: Nasal Mucosa; Nasal Swab  Result Value Ref Range Status   MRSA by PCR Next Gen NOT DETECTED NOT DETECTED Final    Comment: (NOTE) The GeneXpert MRSA Assay (FDA approved for NASAL specimens only), is one component of a comprehensive MRSA colonization surveillance program. It is not intended to diagnose MRSA infection nor to guide or monitor treatment for MRSA infections. Test performance is not FDA approved in patients less than 77 years old. Performed at [facility], [address] [place], [place] [address]      Total time spend on discharging this patient, including the last patient exam, discussing the Crane stay, instructions for ongoing care as it relates to all pertinent caregivers, as well as preparing the medical discharge records, prescriptions, and/or referrals as applicable, is 45 minutes.    Darlin Priestly, MD  [facility]  07/03/2023, 1:02 PM

## 2023-07-03 NOTE — Progress Notes (Signed)
.     Patient Name: Gregory Crane Date of Encounter: 07/03/2023 [facility] Cardiologist: Little Ishikawa, MD   Interval Summary  .    Patient seen on AM rounds. Denies any chest pain or shortness of breath. Converted in sinus around 0230. Remains in sinus brady to sinus this morning. Diltiazem drip stopped around 0235. Eager to go home.   Vital Signs .    Vitals:   07/02/23 2142 07/03/23 0005 07/03/23 0240 07/03/23 0739  BP: (!) 142/80 130/76 135/76 131/72  Pulse: 72 91 (!) 56 (!) 53  Resp: 18 18    Temp: 97.9 F (36.6 C) 97.8 F (36.6 C)  97.8 F (36.6 C)  TempSrc:      SpO2: 96% 95%  98%  Weight: 113.6 kg     Height: 6' (1.829 m)       Intake/Output Summary (Last 24 hours) at 07/03/2023 0816 Last data filed at 07/03/2023 0235 Gross per 24 hour  Intake --  Output 200 ml  Net -200 ml      07/02/2023    9:42 PM 07/02/2023    7:25 AM 06/25/2023    2:10 PM  Last 3 Weights  Weight (lbs) 250 lb 6.4 oz 250 lb 250 lb  Weight (kg) 113.581 kg 113.399 kg 113.399 kg      Telemetry/ECG    Sinus brady to sinus with rates on 50-70, one episode of atrial tach noted over night. - Personally Reviewed  Physical Exam .   GEN: No acute distress.   Neck: No JVD Cardiac: RRR, no murmurs, rubs, or gallops.  Respiratory: Clear to auscultation bilaterally. GI: Soft, nontender, non-distended  MS:  edema, left upper arm AV fistula  Assessment & Plan .     Atrial fibrillation RVR -onset is unclear -converted back to sinus between 0100 and 0130 -Diltiazem drip stopped 0235 -continued on metoprolol tartrate 50 mg every 6 hours changed to metoprolol 25 mg bid for rate control -continued on apixaban 5 mg twice daily for a  CHAD2DS2-VASc of at least 4 for stroke prophylaxis  Elevated high sensitivity troponin -peaked at 236 -remains chest pain free -likely supply/demand mismatch is the setting of ESRD and atrial fib RVR with rates of up to 150 bpm -echocardiogram with  no RWMA noted and LVEF 50-55%  ESRD on hemodialysis -HD M-W-F -fluid removal per Nephrology -Nephrology following  Elevated BNP -denies shortness of breath  -BNP 1514.8 -currently receiving lasix 40 mg IV bid -no accurate I's & O's recorded -fluid removal per HD  Hypertension -blood pressure 131/72 -continued on metoprolol 50 mg every 6 hours -dilt drip stopped this morning at  0235  History of CVA -2019 with CVA, ILR placed for three years with no atrial fibrillation noted and monitor was removed -minimal residual neurologic deficits on the left -continue on ASA and statin    CHA2DS2-VASc Score = 4   This indicates a 4.8% annual risk of stroke. The patient's score is based upon: CHF History: 0 HTN History: 1 Diabetes History: 1 Stroke History: 2 Vascular Disease History: 0 Age Score: 0 Gender Score: 0     For questions or updates, please contact [facility] Please consult [email or web address] for contact info under        Signed, Tasharra Nodine, NP

## 2023-07-03 NOTE — Care Management CC44 (Signed)
Condition Code 44 Documentation Completed  Patient Details  Name: Gregory Crane MRN: [id number] Date of Birth: 05/17/1971   Condition Code 44 given:  Yes Patient signature on Condition Code 44 notice:  Yes Documentation of 2 MD's agreement:  Yes Code 44 added to claim:  Yes    Margarito Liner, LCSW 07/03/2023, 11:04 AM

## 2023-07-03 NOTE — TOC Benefit Eligibility Note (Signed)
Patient Product/process development scientist completed.    The patient is insured through [employer]. Patient has Medicare and is not eligible for a copay card, but may be able to apply for patient assistance, if available.    Ran test claim for Eliquis 5 mg and the current 30 day co-pay is $4.60.   This test claim was processed through [facility]- copay amounts may vary at other pharmacies due to pharmacy/plan contracts, or as the patient moves through the different stages of their insurance plan.     Roland Earl, CPHT Pharmacy Technician III Certified Patient Advocate [facility] Patient Advocate Team Direct Number: [phone]  Fax: [phone]

## 2023-07-03 NOTE — Progress Notes (Addendum)
[facility] [place], [place] 07/03/23  Subjective:   Hospital day # 1  Patient known to our practice from outpatient dialysis.  He dialyzes at [facility] dialysis center.  Patient seen ambulating in room  Alert and oriented Voices concerns of late discharge and having to drive home  Denies chest pain or palpitations.     Objective:  Vital signs in last 24 hours:  Temp:  [97.4 F (36.3 C)-98.1 F (36.7 C)] 97.4 F (36.3 C) (11/07 1227) Pulse Rate:  [53-91] 56 (11/07 1227) Resp:  [17-25] 18 (11/07 0005) BP: (115-152)/(63-82) 152/82 (11/07 1227) SpO2:  [91 %-99 %] 96 % (11/07 1227) Weight:  [113.6 kg] 113.6 kg (11/06 2142)  Weight change:  Filed Weights   07/02/23 0725 07/02/23 2142  Weight: 113.4 kg 113.6 kg    Intake/Output:    Intake/Output Summary (Last 24 hours) at 07/03/2023 1547 Last data filed at 07/03/2023 1000 Gross per 24 hour  Intake 556.94 ml  Output 200 ml  Net 356.94 ml     Physical Exam: General: No acute distress, laying in the bed  HEENT Moist oral mucous membranes  Pulm/lungs Normal breathing effort, clear  CVS/Heart Irregular  Abdomen:  Soft, nontender, nondistended  Extremities: Trace to 1+ edema bilaterally  Neurologic: Alert and oriented  Skin: No Acute rashes  Access: Left upper arm AV fistula       Basic Metabolic Panel:  Recent Labs  Lab 07/02/23 0717 07/02/23 1213 07/03/23 0422  NA 139  --  139  K 3.2*  --  3.6  CL 103  --  101  CO2 24  --  28  GLUCOSE 160*  --  155*  BUN 27*  --  34*  CREATININE 6.71*  --  7.83*  CALCIUM 7.5*  --  7.8*  MG  --  2.1 2.2     CBC: Recent Labs  Lab 07/02/23 0717 07/03/23 0422  WBC 11.8* 6.6  NEUTROABS 9.9*  --   HGB 14.5 13.9  HCT 42.8 41.2  MCV 90.1 90.7  PLT 205 270      Lab Results  Component Value Date   HEPBSAG NON REACTIVE 04/28/2023   HEPBSAB See Scanned report in [facility] (A) 05/04/2022      Microbiology:  Recent Results (from  the past 240 hour(s))  MRSA Next Gen by PCR, Nasal     Status: None   Collection Time: 07/03/23  2:35 AM   Specimen: Nasal Mucosa; Nasal Swab  Result Value Ref Range Status   MRSA by PCR Next Gen NOT DETECTED NOT DETECTED Final    Comment: (NOTE) The GeneXpert MRSA Assay (FDA approved for NASAL specimens only), is one component of a comprehensive MRSA colonization surveillance program. It is not intended to diagnose MRSA infection nor to guide or monitor treatment for MRSA infections. Test performance is not FDA approved in patients less than 16 years old. Performed at [facility], [address] [place], [place] [address]     Coagulation Studies: No results for input(s): "LABPROT", "INR" in the last 72 hours.  Urinalysis: No results for input(s): "COLORURINE", "LABSPEC", "PHURINE", "GLUCOSEU", "HGBUR", "BILIRUBINUR", "KETONESUR", "PROTEINUR", "UROBILINOGEN", "NITRITE", "LEUKOCYTESUR" in the last 72 hours.  Invalid input(s): "APPERANCEUR"    Imaging: ECHOCARDIOGRAM COMPLETE  Result Date: 07/02/2023    ECHOCARDIOGRAM REPORT   Patient Name:   [facility] Date of Exam: 07/02/2023 Medical Rec #:  [id number]      Height:  72.0 in Accession #:    [id number]     Weight:       250.0 lb Date of Birth:  [birth year]     BSA:          2.343 m Patient Age:    51 years       BP:           129/98 mmHg Patient Gender: M              HR:           74 bpm. Exam Location:  [facility] Procedure: 2D Echo, Cardiac Doppler and Color Doppler Indications:     Atrial Fibrillation  History:         Patient has prior history of Echocardiogram examinations, most                  recent 06/05/2022. CAD, Stroke and TIA, Arrythmias:Atrial                  Fibrillation, Signs/Symptoms:Chest Pain; Risk                  Factors:Hypertension, Diabetes and Dyslipidemia. NSTEMI, ESRD                  on Dialysis.  Sonographer:     Mikki Harbor Referring Phys:  [id number] Antonieta Iba Diagnosing Phys: Julien Nordmann MD  Sonographer Comments: Patient is obese. IMPRESSIONS  1. Left ventricular ejection fraction, by estimation, is 50 to 55%. The left ventricle has low normal function. The left ventricle has no regional wall motion abnormalities. Left ventricular diastolic parameters are indeterminate.  2. Right ventricular systolic function is normal. The right ventricular size is normal. There is normal pulmonary artery systolic pressure. The estimated right ventricular systolic pressure is 19.0 mmHg.  3. The mitral valve is normal in structure. Mild mitral valve regurgitation. No evidence of mitral stenosis.  4. The aortic valve is tricuspid. There is mild calcification of the aortic valve. Aortic valve regurgitation is not visualized. Aortic valve sclerosis/calcification is present, without any evidence of aortic stenosis.  5. The inferior vena cava is normal in size with greater than 50% respiratory variability, suggesting right atrial pressure of 3 mmHg. FINDINGS  Left Ventricle: Left ventricular ejection fraction, by estimation, is 50 to 55%. The left ventricle has low normal function. The left ventricle has no regional wall motion abnormalities. The left ventricular internal cavity size was normal in size. There is no left ventricular hypertrophy. Left ventricular diastolic parameters are indeterminate. Right Ventricle: The right ventricular size is normal. No increase in right ventricular wall thickness. Right ventricular systolic function is normal. There is normal pulmonary artery systolic pressure. The tricuspid regurgitant velocity is 2.00 m/s, and  with an assumed right atrial pressure of 3 mmHg, the estimated right ventricular systolic pressure is 19.0 mmHg. Left Atrium: Left atrial size was normal in size. Right Atrium: Right atrial size was normal in size. Pericardium: There is no evidence of pericardial effusion. Mitral Valve: The mitral valve is normal in structure. Mild mitral valve regurgitation. No  evidence of mitral valve stenosis. MV peak gradient, 7.1 mmHg. The mean mitral valve gradient is 2.0 mmHg. Tricuspid Valve: The tricuspid valve is normal in structure. Tricuspid valve regurgitation is mild . No evidence of tricuspid stenosis. Aortic Valve: The aortic valve is tricuspid. There is mild calcification of the aortic valve. Aortic valve regurgitation is not visualized. Aortic valve sclerosis/calcification is present, without any  evidence of aortic stenosis. Aortic valve mean gradient measures 3.0 mmHg. Aortic valve peak gradient measures 4.7 mmHg. Aortic valve area, by VTI measures 2.63 cm. Pulmonic Valve: The pulmonic valve was normal in structure. Pulmonic valve regurgitation is not visualized. No evidence of pulmonic stenosis. Aorta: The aortic root is normal in size and structure. Venous: The inferior vena cava is normal in size with greater than 50% respiratory variability, suggesting right atrial pressure of 3 mmHg. IAS/Shunts: No atrial level shunt detected by color flow Doppler.  LEFT VENTRICLE PLAX 2D LVIDd:         5.70 cm   Diastology LVIDs:         3.90 cm   LV e' medial:    8.27 cm/s LV PW:         1.00 cm   LV E/e' medial:  12.7 LV IVS:        1.20 cm   LV e' lateral:   11.60 cm/s LVOT diam:     2.10 cm   LV E/e' lateral: 9.1 LV SV:         61 LV SV Index:   26 LVOT Area:     3.46 cm  RIGHT VENTRICLE RV Basal diam:  3.50 cm RV Mid diam:    3.10 cm RV S prime:     8.16 cm/s LEFT ATRIUM             Index        RIGHT ATRIUM           Index LA diam:        3.40 cm 1.45 cm/m   RA Area:     18.20 cm LA Vol (A2C):   85.9 ml 36.67 ml/m  RA Volume:   51.60 ml  22.03 ml/m LA Vol (A4C):   49.7 ml 21.22 ml/m LA Biplane Vol: 68.3 ml 29.16 ml/m  AORTIC VALVE                    PULMONIC VALVE AV Area (Vmax):    2.79 cm     PV Vmax:       0.76 m/s AV Area (Vmean):   2.52 cm     PV Peak grad:  2.3 mmHg AV Area (VTI):     2.63 cm AV Vmax:           108.00 cm/s AV Vmean:          76.700 cm/s AV  VTI:            0.232 m AV Peak Grad:      4.7 mmHg AV Mean Grad:      3.0 mmHg LVOT Vmax:         87.00 cm/s LVOT Vmean:        55.700 cm/s LVOT VTI:          0.176 m LVOT/AV VTI ratio: 0.76  AORTA Ao Root diam: 3.90 cm Ao Asc diam:  3.60 cm MITRAL VALVE                TRICUSPID VALVE MV Area (PHT): 4.19 cm     TR Peak grad:   16.0 mmHg MV Area VTI:   1.93 cm     TR Vmax:        200.00 cm/s MV Peak grad:  7.1 mmHg MV Mean grad:  2.0 mmHg     SHUNTS MV Vmax:       1.33 m/s  Systemic VTI:  0.18 m MV Vmean:      69.3 cm/s    Systemic Diam: 2.10 cm MV Decel Time: 181 msec MV E velocity: 105.00 cm/s MV A velocity: 49.80 cm/s MV E/A ratio:  2.11 Julien Nordmann MD Electronically signed by Julien Nordmann MD Signature Date/Time: 07/02/2023/4:02:19 PM    Final    DG Chest Port 1 View  Result Date: 07/02/2023 CLINICAL DATA:  Tachycardia EXAM: PORTABLE CHEST 1 VIEW COMPARISON:  Chest radiograph dated 04/28/2023 FINDINGS: Normal lung volumes. No focal consolidations. No pleural effusion or pneumothorax. Similar enlarged cardiomediastinal silhouette. No acute osseous abnormality. IMPRESSION: 1. Similar cardiomegaly. 2.  No focal consolidations. Electronically Signed   By: Agustin Cree M.D.   On: 07/02/2023 08:11     Medications:      apixaban  5 mg Oral BID   Chlorhexidine Gluconate Cloth  6 each Topical Q0600   furosemide  40 mg Oral BID   metoprolol tartrate  25 mg Oral BID   potassium chloride  10 mEq Oral BID   acetaminophen, feeding supplement (NEPRO CARB STEADY), heparin, lidocaine (PF), lidocaine-prilocaine, ondansetron **OR** ondansetron (ZOFRAN) IV, pentafluoroprop-tetrafluoroeth  Assessment/ Plan:  52 y.o. male with end-stage renal disease, history of cryptogenic stroke in 2019, grade 1 diastolic dysfunction, diabetes, hypertension, hypothyroidism, pseudotumor cerebri    admitted on 07/02/2023 for Atrial fibrillation with rapid ventricular response (HCC) [I48.91] Atrial fibrillation with RVR (HCC)  [I48.91]  #Hypokalemia Corrected. Will utilize 3K bath in outpatient clinic Magnesium 2.2  #End-stage renal disease Hemodialysis deferred today due to stable renal indices. Patient cleared to discharge from renal stance and encouraged to return to outpatient clinic tomorrow for scheduled treatment.   #Lower extremity edema Continue furosemide 40mg  twice daily at discharge.   #Atrial fibrillation with RVR Converted to NSR overnight. Cardizem drip stopped and patient prescribed metoprolol.     LOS: 1 Wendee Beavers 11/7/20243:47 PM  [facility] [address] [place], [place] [phone]   Patient was seen and examined with Wendee Beavers, NP.  I personally formulated plan of care for the problems addressed and discussed with NP.  I agree with the note as documented except as noted below. I take the responsibility for the inherent risk of patient management.

## 2023-07-03 NOTE — Discharge Planning (Signed)
ESTABLISHED HEMODIALYSIS Outpatient Facility [facility]  [address] [place] [address]  Schedule: MWF 5:30am  Dimas Chyle Dialysis Coordinator II  [facility] Cell: [phone] eFax: [phone] [email or web address]

## 2023-07-03 NOTE — Progress Notes (Signed)
Cardizem gtt stopped at 0235 d/t HR <60. Gregory Schwartz, NP made aware. No new orders at this time. Pts BP 135/76 (93) HR 56. Pt has no complaints at this time.

## 2023-07-03 NOTE — Care Management Obs Status (Signed)
MEDICARE OBSERVATION STATUS NOTIFICATION   Patient Details  Name: Gregory Crane MRN: [id number] Date of Birth: [birth year]   Medicare Observation Status Notification Given:  Yes    Margarito Liner, LCSW 07/03/2023, 11:04 AM

## 2023-07-07 ENCOUNTER — Ambulatory Visit: Payer: Medicare Other

## 2023-07-07 NOTE — Therapy (Signed)
OUTPATIENT PHYSICAL THERAPY TREATMENT   Patient Name: Gregory Crane MRN: [id number] DOB:11-Sep-1970, 52 y.o., male Today's Date: 07/08/2023   PCP: Dale Silver Peak MD REFERRING PROVIDER: Dale Elbe MD  END OF SESSION:  PT End of Session - 07/08/23 0841     Visit Number 6    Number of Visits 16    Date for PT Re-Evaluation 07/15/23    PT Start Time 0845    PT Stop Time 0929    PT Time Calculation (min) 44 min               Past Medical History:  Diagnosis Date   Allergy    Anemia    Bell's palsy    Diabetes mellitus without complication (HCC)    diet controlled   Hypertension    Hypothyroidism    Kidney stones    Pseudotumor cerebri    Stroke [facility])    Past Surgical History:  Procedure Laterality Date   COLONOSCOPY WITH PROPOFOL N/A 03/30/2020   Procedure: COLONOSCOPY WITH PROPOFOL;  Surgeon: Regis Bill, MD;  Location: [facility] ENDOSCOPY;  Service: Endoscopy;  Laterality: N/A;   LOOP RECORDER INSERTION N/A 01/27/2018   Procedure: LOOP RECORDER INSERTION;  Surgeon: Duke Salvia, MD;  Location: [facility] INVASIVE CV LAB;  Service: Cardiovascular;  Laterality: N/A;   LUMBAR PUNCTURE     as child   NO PAST SURGERIES     TEE WITHOUT CARDIOVERSION N/A 01/07/2018   Procedure: TRANSESOPHAGEAL ECHOCARDIOGRAM (TEE);  Surgeon: Antonieta Iba, MD;  Location: [facility] ORS;  Service: Cardiovascular;  Laterality: N/A;   Patient Active Problem List   Diagnosis Date Noted   Atrial fibrillation with rapid ventricular response (HCC) 07/03/2023   Atrial fibrillation with RVR (HCC) 07/02/2023   NSTEMI (non-ST elevated myocardial infarction) (HCC) 07/02/2023   Fall 06/15/2023   Non-compliance with renal dialysis (HCC) 05/02/2023   Obesity (BMI 30-39.9) 04/30/2023   Volume overload 04/29/2023   Renal osteodystrophy 11/25/2022   Unsteady gait 10/05/2022   Steal syndrome of dialysis vascular access (HCC) 05/28/2022   Hydronephrosis, left 05/04/2022   Leukocytosis 05/04/2022    Pre-op evaluation 04/24/2022   Chest pain 01/13/2022   Diabetic retinopathy associated with diabetes mellitus due to underlying condition (HCC) 01/13/2022   ESRD on hemodialysis (HCC) 01/13/2022   Deafness in right ear 08/11/2021   Open wound 01/25/2021   History of colon polyps 10/15/2020   Postoperative hemorrhage involving digestive system following digestive system procedure 09/19/2020   Acute cholecystitis without calculus 09/09/2020   Type 2 diabetes mellitus, with long-term current use of insulin (HCC) 09/09/2020   Cryptogenic stroke (HCC) 07/13/2020   History of loop recorder 07/13/2020   History of 2019 novel coronavirus disease (COVID-19) 05/20/2020   Pneumonia due to COVID-19 virus 04/03/2020   Elevated troponin 04/03/2020   Acquired trigger finger 06/08/2019   Lymphedema 06/08/2019   Anemia 04/17/2019   Swelling of both lower extremities 01/10/2019   Facial droop 04/09/2018   Daytime somnolence 03/30/2018   Carotid artery disease (HCC) 02/03/2018   Intracranial vascular stenosis 09/12/2017   Cough 01/20/2017   Bell's palsy 11/10/2016   History of CVA (cerebrovascular accident) 11/10/2016   Benign localized hyperplasia of prostate with urinary obstruction 10/27/2016   History of nephrolithiasis 10/27/2016   TIA (transient ischemic attack) 10/20/2016   Near syncope 06/23/2016   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   Neuropathy 08/06/2015   Health care maintenance 08/06/2015   Essential hypertension 08/06/2015   Heme positive stool 10/10/2013  Hypothyroidism 10/10/2013   Microalbuminuria 10/10/2013   Hyperlipidemia 10/10/2013   B12 deficiency 10/10/2013   Environmental allergies 07/04/2013    ONSET DATE: 3 years ago  REFERRING DIAG: CVA  THERAPY DIAG:  Muscle weakness (generalized)  Other lack of coordination  Unsteadiness on feet  Abnormality of gait and mobility  Difficulty in walking, not elsewhere classified  Rationale for Evaluation and Treatment:  Rehabilitation  SUBJECTIVE:                                                                                                                                                                                             SUBJECTIVE STATEMENT: Pt reports no falls since last visit and no changes to medications He was brought into the hospital last week from outpatient dialysis due to unstable hemodynamics where they found out he has atrial fibrillation. He states he is not in any pain today and has a surgical procedure tomorrow to install a port/cath so he can begin at home dialysis.   Pt accompanied by: self  PERTINENT HISTORY: Patient returning to PT after hospitalization after being off HD for past 3 months with end stage ESRD. PMH DM, HTN, Anemia, Bells palsy, Hypothyroidism, Kidney stones, stroke and pseudotumour cerebri. PAIN:  Are you having pain? No  PRECAUTIONS: Fall  RED FLAGS: None   WEIGHT BEARING RESTRICTIONS: No  FALLS: Has patient fallen in last 6 months? No  PATIENT GOALS: to get LLE stronger and more steady  OBJECTIVE:   TODAY'S TREATMENT:                                                                                                                              DATE: 07/08/23   TE:  -Basketball STS x25, no UE, pt reports fatigue with increased repetitions  -Lateral Step Ups 2x10 each LE,  -Standing hamstring curls w/ 3#aw 2x10, more difficult on left -Heel raises w/ 3#aw 2x10   NMR:  Standing with CGA next to support surface:  -Airex pad: one foot on 6" step one foot on airex pad, hold position for  30 seconds, switch legs, 2x each LE, second trial coupled with dual task of rearranging letters on white board, noticeable trembling of ankles/LE's with fatigue and challenge to maintain stability  -Foot taps from airex onto 6 inch step 1x10, 1x15, no UE support -Walking 300 ft. with 3# ankle weights focusing on step length  -Walking 100 ft in hallway with balance  components x6  - Horizontal head turns x2  - Vertical head turns x2, more difficult than horizontal, path deviation to the left   - Eyes closed x2, path deviation to the left    Unless otherwise stated, CGA was provided and gait belt donned in order to ensure pt safety  PATIENT EDUCATION: Education details: goals, POC,  Person educated: Patient Education method: Explanation, Demonstration, Tactile cues, and Verbal cues Education comprehension: verbalized understanding, returned demonstration, verbal cues required, and tactile cues required  HOME EXERCISE PROGRAM: Access Code: [facility] URL: [email or web address] Date: 06/17/2023 Prepared by: Maureen Ralphs  Exercises - Standing Balance in Corner with Eyes Closed  - 1 x daily - 2-3 x weekly - 3 sets - up to 30 sec hold - Standing Balance in Corner  - 1 x daily - 2-3 x weekly - 3 sets - 30 sec hold - Corner Balance Feet Apart: Eyes Open With Head Turns  - 1 x daily - 2-3 x weekly - 3 sets - 10 reps - Sit to Stand with Arms Crossed  - 1 x daily - 3 x weekly - 3 sets - 10 reps GOALS: Goals reviewed with patient? Yes  SHORT TERM GOALS: Target date: 06/17/2023   Patient will be independent in home exercise program to improve strength/mobility for better functional independence with ADLs. Baseline: Goal status: INITIAL   LONG TERM GOALS: Target date: 07/15/2023   Patient will increase FOTO score to equal to or greater than   72%  to demonstrate statistically significant improvement in mobility and quality of life.  Baseline: 9/24: 65% Goal status: INITIAL  2.   Patient (< 37 years old) will complete five times sit to stand test in < 10 seconds indicating an increased LE strength and improved balance. Baseline: 9/24: 20.23 seconds no hands, one near LOB Goal status: INITIAL  3.   Patient will increase Berg Balance score by >45/56 to demonstrate decreased fall risk during functional activities.  Baseline:  39 Goal status: INITIAL  4.    Patient will increase six minute walk test distance to >1400 for progression to age norm community ambulator and improve gait ability Baseline: 9/24: 1060  Goal status: INITIAL   ASSESSMENT:  CLINICAL IMPRESSION: Patient presents to therapy highly motivated and making progress towards his goals. Treatment session focused on LE  strengthening and balance. Patient presents with impaired balance evidenced by inability to maintain a straight path while completing balance activities. When performing airex activities, there is increased demand on the lower extremities to maintain balance. He uses UE support to complete lateral step ups indicating a need to progress lower extremity strength with L<R.  Patient will benefit from skilled physical therapy to improve strength, stability and mobility.   OBJECTIVE IMPAIRMENTS: Abnormal gait, cardiopulmonary status limiting activity, decreased activity tolerance, decreased balance, decreased coordination, decreased mobility, difficulty walking, decreased strength, impaired flexibility, impaired sensation, impaired vision/preception, improper body mechanics, and postural dysfunction.   ACTIVITY LIMITATIONS: carrying, lifting, bending, sitting, standing, squatting, stairs, transfers, bed mobility, dressing, reach over head, hygiene/grooming, locomotion level, and caring for others  PARTICIPATION LIMITATIONS: meal prep, cleaning, laundry,  personal finances, driving, shopping, community activity, occupation, and yard work  PERSONAL FACTORS: Age, Fitness, Past/current experiences, Time since onset of injury/illness/exacerbation, and 3+ comorbidities: DM, HTN, Anemia, Bells palsy, Hypothyroidism, Kidney stones, stroke and pseudotumour cerebri  are also affecting patient's functional outcome.   REHAB POTENTIAL: Good  CLINICAL DECISION MAKING: Evolving/moderate complexity  EVALUATION COMPLEXITY: Moderate  PLAN:  PT FREQUENCY:  2x/week  PT DURATION: 8 weeks  PLANNED INTERVENTIONS: Therapeutic exercises, Therapeutic activity, Neuromuscular re-education, Balance training, Gait training, Patient/Family education, Self Care, Joint mobilization, Stair training, Vestibular training, Canalith repositioning, Visual/preceptual remediation/compensation, Orthotic/Fit training, DME instructions, Dry Needling, Spinal mobilization, Cryotherapy, Moist heat, Splintting, Taping, Traction, Manual therapy, and Re-evaluation  PLAN FOR NEXT SESSION:  hip flexor strength and ankle strength, balance, resisted multiplanar walking, L LE high level dynamic weight shifting.   Randon Goldsmith, Student-PT  This entire session was performed under direct supervision and direction of a licensed therapist/therapist assistant . I have personally read, edited and approve of the note as written.  Precious Bard, PT 07/08/2023, 2:54 PM  2:54 PM, 07/08/23

## 2023-07-08 ENCOUNTER — Ambulatory Visit: Payer: Medicare Other

## 2023-07-08 ENCOUNTER — Ambulatory Visit: Payer: Medicare Other | Attending: Internal Medicine

## 2023-07-08 DIAGNOSIS — M6281 Muscle weakness (generalized): Secondary | ICD-10-CM | POA: Diagnosis present

## 2023-07-08 DIAGNOSIS — R262 Difficulty in walking, not elsewhere classified: Secondary | ICD-10-CM | POA: Diagnosis present

## 2023-07-08 DIAGNOSIS — R2681 Unsteadiness on feet: Secondary | ICD-10-CM | POA: Diagnosis present

## 2023-07-08 DIAGNOSIS — R269 Unspecified abnormalities of gait and mobility: Secondary | ICD-10-CM | POA: Diagnosis present

## 2023-07-08 DIAGNOSIS — R278 Other lack of coordination: Secondary | ICD-10-CM | POA: Insufficient documentation

## 2023-07-08 DIAGNOSIS — R2689 Other abnormalities of gait and mobility: Secondary | ICD-10-CM | POA: Insufficient documentation

## 2023-07-09 ENCOUNTER — Ambulatory Visit: Payer: Medicare Other

## 2023-07-10 ENCOUNTER — Ambulatory Visit: Payer: Medicare Other

## 2023-07-10 DIAGNOSIS — R269 Unspecified abnormalities of gait and mobility: Secondary | ICD-10-CM

## 2023-07-10 DIAGNOSIS — R262 Difficulty in walking, not elsewhere classified: Secondary | ICD-10-CM

## 2023-07-10 DIAGNOSIS — M6281 Muscle weakness (generalized): Secondary | ICD-10-CM | POA: Diagnosis not present

## 2023-07-10 DIAGNOSIS — R278 Other lack of coordination: Secondary | ICD-10-CM

## 2023-07-10 DIAGNOSIS — R2681 Unsteadiness on feet: Secondary | ICD-10-CM

## 2023-07-10 DIAGNOSIS — R2689 Other abnormalities of gait and mobility: Secondary | ICD-10-CM

## 2023-07-10 NOTE — Therapy (Signed)
OUTPATIENT PHYSICAL THERAPY TREATMENT   Patient Name: Gregory Crane MRN: [id number] DOB:01-08-1971, 52 y.o., male Today's Date: 07/10/2023   PCP: Dale Ruth MD REFERRING PROVIDER: Dale Gisela MD  END OF SESSION:  PT End of Session - 07/10/23 1010     Visit Number 7    Number of Visits 16    Date for PT Re-Evaluation 07/15/23    PT Start Time 0951    PT Stop Time 1014    PT Time Calculation (min) 23 min    Equipment Utilized During Treatment Gait belt    Activity Tolerance Patient tolerated treatment well    Behavior During Therapy WFL for tasks assessed/performed                Past Medical History:  Diagnosis Date   Allergy    Anemia    Bell's palsy    Diabetes mellitus without complication (HCC)    diet controlled   Hypertension    Hypothyroidism    Kidney stones    Pseudotumor cerebri    Stroke [facility])    Past Surgical History:  Procedure Laterality Date   COLONOSCOPY WITH PROPOFOL N/A 03/30/2020   Procedure: COLONOSCOPY WITH PROPOFOL;  Surgeon: Regis Bill, MD;  Location: [facility] ENDOSCOPY;  Service: Endoscopy;  Laterality: N/A;   LOOP RECORDER INSERTION N/A 01/27/2018   Procedure: LOOP RECORDER INSERTION;  Surgeon: Duke Salvia, MD;  Location: [facility] INVASIVE CV LAB;  Service: Cardiovascular;  Laterality: N/A;   LUMBAR PUNCTURE     as child   NO PAST SURGERIES     TEE WITHOUT CARDIOVERSION N/A 01/07/2018   Procedure: TRANSESOPHAGEAL ECHOCARDIOGRAM (TEE);  Surgeon: Antonieta Iba, MD;  Location: [facility] ORS;  Service: Cardiovascular;  Laterality: N/A;   Patient Active Problem List   Diagnosis Date Noted   Atrial fibrillation with rapid ventricular response (HCC) 07/03/2023   Atrial fibrillation with RVR (HCC) 07/02/2023   NSTEMI (non-ST elevated myocardial infarction) (HCC) 07/02/2023   Fall 06/15/2023   Non-compliance with renal dialysis (HCC) 05/02/2023   Obesity (BMI 30-39.9) 04/30/2023   Volume overload 04/29/2023   Renal  osteodystrophy 11/25/2022   Unsteady gait 10/05/2022   Steal syndrome of dialysis vascular access (HCC) 05/28/2022   Hydronephrosis, left 05/04/2022   Leukocytosis 05/04/2022   Pre-op evaluation 04/24/2022   Chest pain 01/13/2022   Diabetic retinopathy associated with diabetes mellitus due to underlying condition (HCC) 01/13/2022   ESRD on hemodialysis (HCC) 01/13/2022   Deafness in right ear 08/11/2021   Open wound 01/25/2021   History of colon polyps 10/15/2020   Postoperative hemorrhage involving digestive system following digestive system procedure 09/19/2020   Acute cholecystitis without calculus 09/09/2020   Type 2 diabetes mellitus, with long-term current use of insulin (HCC) 09/09/2020   Cryptogenic stroke (HCC) 07/13/2020   History of loop recorder 07/13/2020   History of 2019 novel coronavirus disease (COVID-19) 05/20/2020   Pneumonia due to COVID-19 virus 04/03/2020   Elevated troponin 04/03/2020   Acquired trigger finger 06/08/2019   Lymphedema 06/08/2019   Anemia 04/17/2019   Swelling of both lower extremities 01/10/2019   Facial droop 04/09/2018   Daytime somnolence 03/30/2018   Carotid artery disease (HCC) 02/03/2018   Intracranial vascular stenosis 09/12/2017   Cough 01/20/2017   Bell's palsy 11/10/2016   History of CVA (cerebrovascular accident) 11/10/2016   Benign localized hyperplasia of prostate with urinary obstruction 10/27/2016   History of nephrolithiasis 10/27/2016   TIA (transient ischemic attack) 10/20/2016   Near syncope  06/23/2016   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   Neuropathy 08/06/2015   Health care maintenance 08/06/2015   Essential hypertension 08/06/2015   Heme positive stool 10/10/2013   Hypothyroidism 10/10/2013   Microalbuminuria 10/10/2013   Hyperlipidemia 10/10/2013   B12 deficiency 10/10/2013   Environmental allergies 07/04/2013    ONSET DATE: 3 years ago  REFERRING DIAG: CVA  THERAPY DIAG:  Muscle weakness  (generalized)  Other lack of coordination  Unsteadiness on feet  Abnormality of gait and mobility  Difficulty in walking, not elsewhere classified  Other abnormalities of gait and mobility  Rationale for Evaluation and Treatment: Rehabilitation  SUBJECTIVE:                                                                                                                                                                                             SUBJECTIVE STATEMENT: Patient reports sorry for being late. Reports not feeling great- had procedure yesterday for abdominal port placement- symptoms are dry mouth and difficulty urinating.     Pt accompanied by: self  PERTINENT HISTORY: Patient returning to PT after hospitalization after being off HD for past 3 months with end stage ESRD. PMH DM, HTN, Anemia, Bells palsy, Hypothyroidism, Kidney stones, stroke and pseudotumour cerebri. PAIN:  Are you having pain? No  PRECAUTIONS: Fall  RED FLAGS: None   WEIGHT BEARING RESTRICTIONS: No  FALLS: Has patient fallen in last 6 months? No  PATIENT GOALS: to get LLE stronger and more steady  OBJECTIVE:   TODAY'S TREATMENT:                                                                                                                              DATE: 07/10/23   NMR:   Forward/side step/retro/diagonal x several min with ankle resistance- 4#- 15 feet each  Resistive gait in clinic- 300 feet without LOB- fatigue as only limiting factor- VC for increased step length.    Unless otherwise stated, CGA was provided and gait belt donned in order to ensure pt safety  PATIENT EDUCATION: Education details: goals, POC,  Person  educated: Patient Education method: Explanation, Demonstration, Tactile cues, and Verbal cues Education comprehension: verbalized understanding, returned demonstration, verbal cues required, and tactile cues required  HOME EXERCISE PROGRAM: Access Code: [facility] URL:  [email or web address] Date: 06/17/2023 Prepared by: Maureen Ralphs  Exercises - Standing Balance in Corner with Eyes Closed  - 1 x daily - 2-3 x weekly - 3 sets - up to 30 sec hold - Standing Balance in Corner  - 1 x daily - 2-3 x weekly - 3 sets - 30 sec hold - Corner Balance Feet Apart: Eyes Open With Head Turns  - 1 x daily - 2-3 x weekly - 3 sets - 10 reps - Sit to Stand with Arms Crossed  - 1 x daily - 3 x weekly - 3 sets - 10 reps GOALS: Goals reviewed with patient? Yes  SHORT TERM GOALS: Target date: 06/17/2023   Patient will be independent in home exercise program to improve strength/mobility for better functional independence with ADLs. Baseline: Goal status: INITIAL   LONG TERM GOALS: Target date: 07/15/2023   Patient will increase FOTO score to equal to or greater than   72%  to demonstrate statistically significant improvement in mobility and quality of life.  Baseline: 9/24: 65% Goal status: INITIAL  2.   Patient (< 11 years old) will complete five times sit to stand test in < 10 seconds indicating an increased LE strength and improved balance. Baseline: 9/24: 20.23 seconds no hands, one near LOB Goal status: INITIAL  3.   Patient will increase Berg Balance score by >45/56 to demonstrate decreased fall risk during functional activities.  Baseline: 39 Goal status: INITIAL  4.    Patient will increase six minute walk test distance to >1400 for progression to age norm community ambulator and improve gait ability Baseline: 9/24: 1060  Goal status: INITIAL   ASSESSMENT:  CLINICAL IMPRESSION: Treatment very limited secondary to patient 20 min late arrival + just had procedure for abdominal port for hemodialysis. He was challenged with some dynamic functional mobility including lateral/retro resistive gait today. Deferred any LE strengthening as did not want to strain the abdominal area today. He was unsteady mostly with diagonal movement yet self  reports retro gait as most challenging. Patient instructed to call his MD to report his symptoms mentioned in subjective section. He verbalized understanding. He will be up for reassessment next visit.  Patient will benefit from skilled physical therapy to improve strength, stability and mobility.   OBJECTIVE IMPAIRMENTS: Abnormal gait, cardiopulmonary status limiting activity, decreased activity tolerance, decreased balance, decreased coordination, decreased mobility, difficulty walking, decreased strength, impaired flexibility, impaired sensation, impaired vision/preception, improper body mechanics, and postural dysfunction.   ACTIVITY LIMITATIONS: carrying, lifting, bending, sitting, standing, squatting, stairs, transfers, bed mobility, dressing, reach over head, hygiene/grooming, locomotion level, and caring for others  PARTICIPATION LIMITATIONS: meal prep, cleaning, laundry, personal finances, driving, shopping, community activity, occupation, and yard work  PERSONAL FACTORS: Age, Fitness, Past/current experiences, Time since onset of injury/illness/exacerbation, and 3+ comorbidities: DM, HTN, Anemia, Bells palsy, Hypothyroidism, Kidney stones, stroke and pseudotumour cerebri  are also affecting patient's functional outcome.   REHAB POTENTIAL: Good  CLINICAL DECISION MAKING: Evolving/moderate complexity  EVALUATION COMPLEXITY: Moderate  PLAN:  PT FREQUENCY: 2x/week  PT DURATION: 8 weeks  PLANNED INTERVENTIONS: Therapeutic exercises, Therapeutic activity, Neuromuscular re-education, Balance training, Gait training, Patient/Family education, Self Care, Joint mobilization, Stair training, Vestibular training, Canalith repositioning, Visual/preceptual remediation/compensation, Orthotic/Fit training, DME instructions, Dry Needling, Spinal mobilization, Cryotherapy, Moist heat, Splintting, Taping, Traction,  Manual therapy, and Re-evaluation  PLAN FOR NEXT SESSION:  hip flexor strength and  ankle strength, balance, resisted multiplanar walking, L LE high level dynamic weight shifting. Recert next visit   Lenda Kelp, PT 07/10/2023, 10:54 AM  10:54 AM, 07/10/23

## 2023-07-14 ENCOUNTER — Ambulatory Visit: Payer: Medicare Other

## 2023-07-14 NOTE — Therapy (Signed)
OUTPATIENT PHYSICAL THERAPY TREATMENT/ RECERT   Patient Name: Gregory Crane MRN: [id number] DOB:February 02, 1971, 52 y.o., male Today's Date: 07/15/2023   PCP: Dale Austin MD REFERRING PROVIDER: Dale Rose Lodge MD  END OF SESSION:  PT End of Session - 07/15/23 0850     Visit Number 8    Number of Visits 24    Date for PT Re-Evaluation 09/09/23    PT Start Time 0845    PT Stop Time 0929    PT Time Calculation (min) 44 min    Equipment Utilized During Treatment Gait belt    Activity Tolerance Patient tolerated treatment well    Behavior During Therapy WFL for tasks assessed/performed                 Past Medical History:  Diagnosis Date   Allergy    Anemia    Bell's palsy    Diabetes mellitus without complication (HCC)    diet controlled   Hypertension    Hypothyroidism    Kidney stones    Pseudotumor cerebri    Stroke [facility])    Past Surgical History:  Procedure Laterality Date   COLONOSCOPY WITH PROPOFOL N/A 03/30/2020   Procedure: COLONOSCOPY WITH PROPOFOL;  Surgeon: Regis Bill, MD;  Location: [facility] ENDOSCOPY;  Service: Endoscopy;  Laterality: N/A;   LOOP RECORDER INSERTION N/A 01/27/2018   Procedure: LOOP RECORDER INSERTION;  Surgeon: Duke Salvia, MD;  Location: [facility] INVASIVE CV LAB;  Service: Cardiovascular;  Laterality: N/A;   LUMBAR PUNCTURE     as child   NO PAST SURGERIES     TEE WITHOUT CARDIOVERSION N/A 01/07/2018   Procedure: TRANSESOPHAGEAL ECHOCARDIOGRAM (TEE);  Surgeon: Antonieta Iba, MD;  Location: [facility] ORS;  Service: Cardiovascular;  Laterality: N/A;   Patient Active Problem List   Diagnosis Date Noted   Atrial fibrillation with rapid ventricular response (HCC) 07/03/2023   Atrial fibrillation with RVR (HCC) 07/02/2023   NSTEMI (non-ST elevated myocardial infarction) (HCC) 07/02/2023   Fall 06/15/2023   Non-compliance with renal dialysis (HCC) 05/02/2023   Obesity (BMI 30-39.9) 04/30/2023   Volume overload 04/29/2023   Renal  osteodystrophy 11/25/2022   Unsteady gait 10/05/2022   Steal syndrome of dialysis vascular access (HCC) 05/28/2022   Hydronephrosis, left 05/04/2022   Leukocytosis 05/04/2022   Pre-op evaluation 04/24/2022   Chest pain 01/13/2022   Diabetic retinopathy associated with diabetes mellitus due to underlying condition (HCC) 01/13/2022   ESRD on hemodialysis (HCC) 01/13/2022   Deafness in right ear 08/11/2021   Open wound 01/25/2021   History of colon polyps 10/15/2020   Postoperative hemorrhage involving digestive system following digestive system procedure 09/19/2020   Acute cholecystitis without calculus 09/09/2020   Type 2 diabetes mellitus, with long-term current use of insulin (HCC) 09/09/2020   Cryptogenic stroke (HCC) 07/13/2020   History of loop recorder 07/13/2020   History of 2019 novel coronavirus disease (COVID-19) 05/20/2020   Pneumonia due to COVID-19 virus 04/03/2020   Elevated troponin 04/03/2020   Acquired trigger finger 06/08/2019   Lymphedema 06/08/2019   Anemia 04/17/2019   Swelling of both lower extremities 01/10/2019   Facial droop 04/09/2018   Daytime somnolence 03/30/2018   Carotid artery disease (HCC) 02/03/2018   Intracranial vascular stenosis 09/12/2017   Cough 01/20/2017   Bell's palsy 11/10/2016   History of CVA (cerebrovascular accident) 11/10/2016   Benign localized hyperplasia of prostate with urinary obstruction 10/27/2016   History of nephrolithiasis 10/27/2016   TIA (transient ischemic attack) 10/20/2016  Near syncope 06/23/2016   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   Neuropathy 08/06/2015   Health care maintenance 08/06/2015   Essential hypertension 08/06/2015   Heme positive stool 10/10/2013   Hypothyroidism 10/10/2013   Microalbuminuria 10/10/2013   Hyperlipidemia 10/10/2013   B12 deficiency 10/10/2013   Environmental allergies 07/04/2013    ONSET DATE: 3 years ago  REFERRING DIAG: CVA  THERAPY DIAG:  Muscle weakness  (generalized)  Unsteadiness on feet  Difficulty in walking, not elsewhere classified  Rationale for Evaluation and Treatment: Rehabilitation  SUBJECTIVE:                                                                                                                                                                                             SUBJECTIVE STATEMENT: Patient reports he still wants to work on his balance and stairs. He is not recovered yet from his procedure last week.    Pt accompanied by: self  PERTINENT HISTORY: Patient returning to PT after hospitalization after being off HD for past 3 months with end stage ESRD. PMH DM, HTN, Anemia, Bells palsy, Hypothyroidism, Kidney stones, stroke and pseudotumour cerebri. PAIN:  Are you having pain? No  PRECAUTIONS: Fall  RED FLAGS: None   WEIGHT BEARING RESTRICTIONS: No  FALLS: Has patient fallen in last 6 months? No  PATIENT GOALS: to get LLE stronger and more steady  OBJECTIVE:   TODAY'S TREATMENT:                                                                                                                              DATE: 07/15/23  Physical therapy treatment session today consisted of completing assessment of goals and administration of testing as demonstrated and documented in flow sheet, treatment, and goals section of this note. Addition treatments may be found below.   TherEx: Seated with dynadisc: -df/pf 20x focus on neutral alignment -EV/INV 20x -clockwise 10x, counterclockwise 10x RTB ER/IR 15x each LE RTB df march with band across toes    PATIENT EDUCATION: Education details: goals, POC,  Person educated: Patient Education method: Programmer, multimedia, Demonstration,  Tactile cues, and Verbal cues Education comprehension: verbalized understanding, returned demonstration, verbal cues required, and tactile cues required  HOME EXERCISE PROGRAM: Access Code: [facility] URL:  [email or web address] Date: 06/17/2023 Prepared by: Maureen Ralphs  Exercises - Standing Balance in Corner with Eyes Closed  - 1 x daily - 2-3 x weekly - 3 sets - up to 30 sec hold - Standing Balance in Corner  - 1 x daily - 2-3 x weekly - 3 sets - 30 sec hold - Corner Balance Feet Apart: Eyes Open With Head Turns  - 1 x daily - 2-3 x weekly - 3 sets - 10 reps - Sit to Stand with Arms Crossed  - 1 x daily - 3 x weekly - 3 sets - 10 reps GOALS: Goals reviewed with patient? Yes  SHORT TERM GOALS: Target date: 06/17/2023   Patient will be independent in home exercise program to improve strength/mobility for better functional independence with ADLs. Baseline: 11/19: compliant  Goal status: MET   LONG TERM GOALS: Target date: 09/09/2023     Patient will increase FOTO score to equal to or greater than   72%  to demonstrate statistically significant improvement in mobility and quality of life.  Baseline: 9/24: 65% 11/19: 60%  Goal status: In progress  2.   Patient (< 59 years old) will complete five times sit to stand test in < 10 seconds indicating an increased LE strength and improved balance. Baseline: 9/24: 20.23 seconds no hands, one near LOB 11/19:15.02 seconds no hands Goal status: Partially Met  3.   Patient will increase Berg Balance score by >45/56 to demonstrate decreased fall risk during functional activities.  Baseline: 39 11/19: 44 Goal status: Partially Met   4.    Patient will increase six minute walk test distance to >1400 for progression to age norm community ambulator and improve gait ability Baseline: 9/24: 1060 11/19: 1070 ft  Goal status: In Progress   5.    Patient will be independent with ascend/descend 12 steps using single UE in step over step pattern without LOB.  Baseline:11/19: sometimes has to use hands and feet like a ladder to get up the stairs  Goal status: INITIAL    ASSESSMENT:  CLINICAL IMPRESSION: Patient is progressing  towards functional goals at this time. He is challenged with single limb stability and has L foot eversion that worsens with fatigue. His balance and mobility is improving but not yet at optimal range. Patient is agreeable with POC at this time and eager to progress his functional safe mobility.  Patient will benefit from skilled physical therapy to improve strength, stability and mobility.   OBJECTIVE IMPAIRMENTS: Abnormal gait, cardiopulmonary status limiting activity, decreased activity tolerance, decreased balance, decreased coordination, decreased mobility, difficulty walking, decreased strength, impaired flexibility, impaired sensation, impaired vision/preception, improper body mechanics, and postural dysfunction.   ACTIVITY LIMITATIONS: carrying, lifting, bending, sitting, standing, squatting, stairs, transfers, bed mobility, dressing, reach over head, hygiene/grooming, locomotion level, and caring for others  PARTICIPATION LIMITATIONS: meal prep, cleaning, laundry, personal finances, driving, shopping, community activity, occupation, and yard work  PERSONAL FACTORS: Age, Fitness, Past/current experiences, Time since onset of injury/illness/exacerbation, and 3+ comorbidities: DM, HTN, Anemia, Bells palsy, Hypothyroidism, Kidney stones, stroke and pseudotumour cerebri  are also affecting patient's functional outcome.   REHAB POTENTIAL: Good  CLINICAL DECISION MAKING: Evolving/moderate complexity  EVALUATION COMPLEXITY: Moderate  PLAN:  PT FREQUENCY: 2x/week  PT DURATION: 8 weeks  PLANNED INTERVENTIONS: Therapeutic exercises, Therapeutic activity, Neuromuscular re-education, Balance training, Gait  training, Patient/Family education, Self Care, Joint mobilization, Stair training, Vestibular training, Canalith repositioning, Visual/preceptual remediation/compensation, Orthotic/Fit training, DME instructions, Dry Needling, Spinal mobilization, Cryotherapy, Moist heat, Splintting, Taping,  Traction, Manual therapy, and Re-evaluation  PLAN FOR NEXT SESSION:  hip flexor strength and ankle strength, balance, resisted multiplanar walking, L LE high level dynamic weight shifting. Recert next visit   Precious Bard, PT 07/15/2023, 9:41 AM  9:41 AM, 07/15/23

## 2023-07-15 ENCOUNTER — Ambulatory Visit: Payer: Medicare Other

## 2023-07-15 DIAGNOSIS — R262 Difficulty in walking, not elsewhere classified: Secondary | ICD-10-CM

## 2023-07-15 DIAGNOSIS — R2681 Unsteadiness on feet: Secondary | ICD-10-CM

## 2023-07-15 DIAGNOSIS — M6281 Muscle weakness (generalized): Secondary | ICD-10-CM | POA: Diagnosis not present

## 2023-07-16 ENCOUNTER — Ambulatory Visit: Payer: Medicare Other

## 2023-07-17 ENCOUNTER — Ambulatory Visit: Payer: Medicare Other

## 2023-07-19 ENCOUNTER — Other Ambulatory Visit: Payer: Self-pay | Admitting: Internal Medicine

## 2023-07-21 ENCOUNTER — Ambulatory Visit: Payer: Medicare Other

## 2023-07-21 NOTE — Telephone Encounter (Signed)
Reviewed the refill history.  He refilled last rx by me in 01/2023.  Was given a prescription in hospital.  Directions from hospital admission were lyrica 100mg  q day.  Need to clarify if he is taking and how taking the medication.
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
## 2023-07-22 ENCOUNTER — Ambulatory Visit: Payer: Medicare Other

## 2023-07-22 DIAGNOSIS — R269 Unspecified abnormalities of gait and mobility: Secondary | ICD-10-CM

## 2023-07-22 DIAGNOSIS — R262 Difficulty in walking, not elsewhere classified: Secondary | ICD-10-CM

## 2023-07-22 DIAGNOSIS — R278 Other lack of coordination: Secondary | ICD-10-CM

## 2023-07-22 DIAGNOSIS — M6281 Muscle weakness (generalized): Secondary | ICD-10-CM

## 2023-07-22 DIAGNOSIS — R2681 Unsteadiness on feet: Secondary | ICD-10-CM

## 2023-07-23 ENCOUNTER — Ambulatory Visit: Payer: Medicare Other

## 2023-07-27 DIAGNOSIS — W19XXXA Unspecified fall, initial encounter: Secondary | ICD-10-CM

## 2023-07-27 HISTORY — DX: Unspecified fall, initial encounter: W19.XXXA

## 2023-07-28 ENCOUNTER — Ambulatory Visit: Payer: Medicare Other

## 2023-07-28 NOTE — Therapy (Signed)
OUTPATIENT PHYSICAL THERAPY TREATMENT Physical Therapy Progress Note   Dates of reporting period  05/20/23   to   07/29/2023    Patient Name: Gregory Crane MRN: [id number] DOB:Jul 10, 1971, 52 y.o., male Today's Date: 07/29/2023   PCP: Dale [place] MD REFERRING PROVIDER: Dale [place] MD  END OF SESSION:  PT End of Session - 07/29/23 1002     Visit Number 10    Number of Visits 24    Date for PT Re-Evaluation 09/09/23    Authorization Type Medicare (trad)    Authorization Time Period 07/15/23-09/09/23    PT Start Time 0848    PT Stop Time 0929    PT Time Calculation (min) 41 min    Equipment Utilized During Treatment Gait belt    Activity Tolerance Patient tolerated treatment well    Behavior During Therapy WFL for tasks assessed/performed                   Past Medical History:  Diagnosis Date   Allergy    Anemia    Bell's palsy    Diabetes mellitus without complication (HCC)    diet controlled   Hypertension    Hypothyroidism    Kidney stones    Pseudotumor cerebri    Stroke [facility])    Past Surgical History:  Procedure Laterality Date   COLONOSCOPY WITH PROPOFOL N/A 03/30/2020   Procedure: COLONOSCOPY WITH PROPOFOL;  Surgeon: Regis Bill, MD;  Location: [facility] ENDOSCOPY;  Service: Endoscopy;  Laterality: N/A;   LOOP RECORDER INSERTION N/A 01/27/2018   Procedure: LOOP RECORDER INSERTION;  Surgeon: Duke Salvia, MD;  Location: [facility] INVASIVE CV LAB;  Service: Cardiovascular;  Laterality: N/A;   LUMBAR PUNCTURE     as child   NO PAST SURGERIES     TEE WITHOUT CARDIOVERSION N/A 01/07/2018   Procedure: TRANSESOPHAGEAL ECHOCARDIOGRAM (TEE);  Surgeon: Antonieta Iba, MD;  Location: [facility] ORS;  Service: Cardiovascular;  Laterality: N/A;   Patient Active Problem List   Diagnosis Date Noted   Atrial fibrillation with rapid ventricular response (HCC) 07/03/2023   Atrial fibrillation with RVR (HCC) 07/02/2023   NSTEMI (non-ST elevated myocardial  infarction) (HCC) 07/02/2023   Fall 06/15/2023   Non-compliance with renal dialysis (HCC) 05/02/2023   Obesity (BMI 30-39.9) 04/30/2023   Volume overload 04/29/2023   Renal osteodystrophy 11/25/2022   Unsteady gait 10/05/2022   Steal syndrome of dialysis vascular access (HCC) 05/28/2022   Hydronephrosis, left 05/04/2022   Leukocytosis 05/04/2022   Pre-op evaluation 04/24/2022   Chest pain 01/13/2022   Diabetic retinopathy associated with diabetes mellitus due to underlying condition (HCC) 01/13/2022   ESRD on hemodialysis (HCC) 01/13/2022   Deafness in right ear 08/11/2021   Open wound 01/25/2021   History of colon polyps 10/15/2020   Postoperative hemorrhage involving digestive system following digestive system procedure 09/19/2020   Acute cholecystitis without calculus 09/09/2020   Type 2 diabetes mellitus, with long-term current use of insulin (HCC) 09/09/2020   Cryptogenic stroke (HCC) 07/13/2020   History of loop recorder 07/13/2020   History of 2019 novel coronavirus disease (COVID-19) 05/20/2020   Pneumonia due to COVID-19 virus 04/03/2020   Elevated troponin 04/03/2020   Acquired trigger finger 06/08/2019   Lymphedema 06/08/2019   Anemia 04/17/2019   Swelling of both lower extremities 01/10/2019   Facial droop 04/09/2018   Daytime somnolence 03/30/2018   Carotid artery disease (HCC) 02/03/2018   Intracranial vascular stenosis 09/12/2017   Cough 01/20/2017   Bell's palsy 11/10/2016  History of CVA (cerebrovascular accident) 11/10/2016   Benign localized hyperplasia of prostate with urinary obstruction 10/27/2016   History of nephrolithiasis 10/27/2016   TIA (transient ischemic attack) 10/20/2016   Near syncope 06/23/2016   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   Neuropathy 08/06/2015   Health care maintenance 08/06/2015   Essential hypertension 08/06/2015   Heme positive stool 10/10/2013   Hypothyroidism 10/10/2013   Microalbuminuria 10/10/2013   Hyperlipidemia  10/10/2013   B12 deficiency 10/10/2013   Environmental allergies 07/04/2013    ONSET DATE: 3 years ago  REFERRING DIAG: CVA  THERAPY DIAG:  Muscle weakness (generalized)  Unsteadiness on feet  Difficulty in walking, not elsewhere classified  Other lack of coordination  Abnormality of gait and mobility  Rationale for Evaluation and Treatment: Rehabilitation  SUBJECTIVE:                                                                                                                                                                                             SUBJECTIVE STATEMENT: Pt reports no changes since last visit. He denies reports of stumbles/falls. He rates his pain as 0/10.     Pt accompanied by: self  PERTINENT HISTORY: Patient returning to PT after hospitalization after being off HD for past 3 months with end stage ESRD. PMH DM, HTN, Anemia, Bells palsy, Hypothyroidism, Kidney stones, stroke and pseudotumour cerebri. PAIN:  Are you having pain? No  PRECAUTIONS: Fall  RED FLAGS: None   WEIGHT BEARING RESTRICTIONS: No  FALLS: Has patient fallen in last 6 months? No  PATIENT GOALS: to get LLE stronger and more steady  OBJECTIVE:   TODAY'S TREATMENT:                                                                                                                              DATE: 07/29/23  TherEx: -Step ups 2x10 each LE, attempted first reps with 4#aw and from Airex but pt with increased difficulty this session and experiencing posterior LOB -Three way hip hike with 4#aw 1x10 each LE -Standing hamstring curls with 4#aw 2x10  NMR:  - Foot taps from Airex onto 6 inch step 2x10 each LE - Lateral steps from airex to airex over half foam roller, 2x10 each LE, pt performed first set with UE support, pt able to perform half of the reps for the second set without UE support  -Split stance with one foot on 6 inch step and one foot on Airex 2x20 sec, pt with  eversion of ankles throughout with L>R -Split stance with one foot on 6 inch step and one foot on Airex while catching a ball x15 catches with each LE in front, more difficulty with left LE in front compared to right  -Ambulation in hallway with horizontal head turns x4 -Ambulation in hallway with vertical head turns x2 - Ambulation in hallway with eyes closed x2 - Pt deviates path to the left while completing dynamic balance activities        PATIENT EDUCATION: Education details: goals, POC,  Person educated: Patient Education method: Explanation, Demonstration, Tactile cues, and Verbal cues Education comprehension: verbalized understanding, returned demonstration, verbal cues required, and tactile cues required  HOME EXERCISE PROGRAM: Access Code: [facility] URL: [email or web address] Date: 06/17/2023 Prepared by: Maureen Ralphs  Exercises - Standing Balance in Corner with Eyes Closed  - 1 x daily - 2-3 x weekly - 3 sets - up to 30 sec hold - Standing Balance in Corner  - 1 x daily - 2-3 x weekly - 3 sets - 30 sec hold - Corner Balance Feet Apart: Eyes Open With Head Turns  - 1 x daily - 2-3 x weekly - 3 sets - 10 reps - Sit to Stand with Arms Crossed  - 1 x daily - 3 x weekly - 3 sets - 10 reps GOALS: Goals reviewed with patient? Yes  SHORT TERM GOALS: Target date: 06/17/2023   Patient will be independent in home exercise program to improve strength/mobility for better functional independence with ADLs. Baseline: 11/19: compliant  Goal status: MET   LONG TERM GOALS: Target date: 09/09/2023   Patient will increase FOTO score to equal to or greater than   72%  to demonstrate statistically significant improvement in mobility and quality of life.  Baseline: 9/24: 65% 11/19: 60%  Goal status: In progress  2.   Patient (< 69 years old) will complete five times sit to stand test in < 10 seconds indicating an increased LE strength and improved  balance. Baseline: 9/24: 20.23 seconds no hands, one near LOB 11/19:15.02 seconds no hands Goal status: Partially Met  3.   Patient will increase Berg Balance score by >45/56 to demonstrate decreased fall risk during functional activities.  Baseline: 39 11/19: 44 Goal status: Partially Met   4.    Patient will increase six minute walk test distance to >1400 for progression to age norm community ambulator and improve gait ability Baseline: 9/24: 1060 11/19: 1070 ft  Goal status: In Progress   5.    Patient will be independent with ascend/descend 12 steps using single UE in step over step pattern without LOB.  Baseline:11/19: sometimes has to use hands and feet like a ladder to get up the stairs  Goal status: INITIAL   ASSESSMENT:  CLINICAL IMPRESSION: Patient presents motivated and making progress towards his goals. Goal assessment performed on recert date of 11/19.  His balance was slightly more impaired compared to previous sessions evidenced by the inability to perform weighted step ups without a loss of balance. As repetitions increased, he was able to perform activities  with less instances of LOB. There is increased eversion of the ankle throughout strengthening and balance activities which impacts his stability. With performance of dynamic balance activities, his path deviates to the left requiring verbal cues to maintain a straight path. Patient will benefit from skilled physical therapy to improve strength, stability and mobility. Patient's condition has the potential to improve in response to therapy. Maximum improvement is yet to be obtained. The anticipated improvement is attainable and reasonable in a generally predictable time.    OBJECTIVE IMPAIRMENTS: Abnormal gait, cardiopulmonary status limiting activity, decreased activity tolerance, decreased balance, decreased coordination, decreased mobility, difficulty walking, decreased strength, impaired flexibility, impaired sensation,  impaired vision/preception, improper body mechanics, and postural dysfunction.   ACTIVITY LIMITATIONS: carrying, lifting, bending, sitting, standing, squatting, stairs, transfers, bed mobility, dressing, reach over head, hygiene/grooming, locomotion level, and caring for others  PARTICIPATION LIMITATIONS: meal prep, cleaning, laundry, personal finances, driving, shopping, community activity, occupation, and yard work  PERSONAL FACTORS: Age, Fitness, Past/current experiences, Time since onset of injury/illness/exacerbation, and 3+ comorbidities: DM, HTN, Anemia, Bells palsy, Hypothyroidism, Kidney stones, stroke and pseudotumour cerebri  are also affecting patient's functional outcome.   REHAB POTENTIAL: Good  CLINICAL DECISION MAKING: Evolving/moderate complexity  EVALUATION COMPLEXITY: Moderate  PLAN:  PT FREQUENCY: 2x/week  PT DURATION: 8 weeks  PLANNED INTERVENTIONS: Therapeutic exercises, Therapeutic activity, Neuromuscular re-education, Balance training, Gait training, Patient/Family education, Self Care, Joint mobilization, Stair training, Vestibular training, Canalith repositioning, Visual/preceptual remediation/compensation, Orthotic/Fit training, DME instructions, Dry Needling, Spinal mobilization, Cryotherapy, Moist heat, Splintting, Taping, Traction, Manual therapy, and Re-evaluation  PLAN FOR NEXT SESSION:  hip flexor strength and ankle strength, balance, resisted multiplanar walking, L LE high level dynamic weight shifting.    Randon Goldsmith, Student-PT Randon Goldsmith, Student-PT 07/29/2023, 11:00 AM  11:00 AM, 07/29/23   This entire session was performed under direct supervision and direction of a licensed therapist/therapist assistant . I have personally read, edited and approve of the note as written. Temple Pacini PT, DPT    11:00 AM, 07/29/23 Temple Pacini PT, DPT  Physical Therapist - [facility]  Outpatient Physical Therapy- Main  Campus [phone]

## 2023-07-29 ENCOUNTER — Ambulatory Visit: Payer: Medicare Other

## 2023-07-29 ENCOUNTER — Ambulatory Visit: Payer: Medicare Other | Attending: Internal Medicine

## 2023-07-29 DIAGNOSIS — R2681 Unsteadiness on feet: Secondary | ICD-10-CM | POA: Insufficient documentation

## 2023-07-29 DIAGNOSIS — R262 Difficulty in walking, not elsewhere classified: Secondary | ICD-10-CM | POA: Diagnosis present

## 2023-07-29 DIAGNOSIS — M6281 Muscle weakness (generalized): Secondary | ICD-10-CM | POA: Diagnosis present

## 2023-07-29 DIAGNOSIS — R278 Other lack of coordination: Secondary | ICD-10-CM | POA: Insufficient documentation

## 2023-07-29 DIAGNOSIS — R269 Unspecified abnormalities of gait and mobility: Secondary | ICD-10-CM | POA: Diagnosis present

## 2023-07-30 ENCOUNTER — Ambulatory Visit: Payer: Medicare Other

## 2023-07-30 NOTE — Therapy (Signed)
OUTPATIENT PHYSICAL THERAPY TREATMENT Physical Therapy Progress Note   Dates of reporting period  05/20/23   to   07/29/2023    Patient Name: Gregory Crane MRN: [id number] DOB:May 28, 1971, 52 y.o., male Today's Date: 07/30/2023   PCP: Dale [place] MD REFERRING PROVIDER: Dale [place] MD  END OF SESSION:          Past Medical History:  Diagnosis Date   Allergy    Anemia    Bell's palsy    Diabetes mellitus without complication (HCC)    diet controlled   Hypertension    Hypothyroidism    Kidney stones    Pseudotumor cerebri    Stroke [facility])    Past Surgical History:  Procedure Laterality Date   COLONOSCOPY WITH PROPOFOL N/A 03/30/2020   Procedure: COLONOSCOPY WITH PROPOFOL;  Surgeon: Regis Bill, MD;  Location: [facility] ENDOSCOPY;  Service: Endoscopy;  Laterality: N/A;   LOOP RECORDER INSERTION N/A 01/27/2018   Procedure: LOOP RECORDER INSERTION;  Surgeon: Duke Salvia, MD;  Location: [facility] INVASIVE CV LAB;  Service: Cardiovascular;  Laterality: N/A;   LUMBAR PUNCTURE     as child   NO PAST SURGERIES     TEE WITHOUT CARDIOVERSION N/A 01/07/2018   Procedure: TRANSESOPHAGEAL ECHOCARDIOGRAM (TEE);  Surgeon: Antonieta Iba, MD;  Location: [facility] ORS;  Service: Cardiovascular;  Laterality: N/A;   Patient Active Problem List   Diagnosis Date Noted   Atrial fibrillation with rapid ventricular response (HCC) 07/03/2023   Atrial fibrillation with RVR (HCC) 07/02/2023   NSTEMI (non-ST elevated myocardial infarction) (HCC) 07/02/2023   Fall 06/15/2023   Non-compliance with renal dialysis (HCC) 05/02/2023   Obesity (BMI 30-39.9) 04/30/2023   Volume overload 04/29/2023   Renal osteodystrophy 11/25/2022   Unsteady gait 10/05/2022   Steal syndrome of dialysis vascular access (HCC) 05/28/2022   Hydronephrosis, left 05/04/2022   Leukocytosis 05/04/2022   Pre-op evaluation 04/24/2022   Chest pain 01/13/2022   Diabetic retinopathy associated with diabetes mellitus  due to underlying condition (HCC) 01/13/2022   ESRD on hemodialysis (HCC) 01/13/2022   Deafness in right ear 08/11/2021   Open wound 01/25/2021   History of colon polyps 10/15/2020   Postoperative hemorrhage involving digestive system following digestive system procedure 09/19/2020   Acute cholecystitis without calculus 09/09/2020   Type 2 diabetes mellitus, with long-term current use of insulin (HCC) 09/09/2020   Cryptogenic stroke (HCC) 07/13/2020   History of loop recorder 07/13/2020   History of 2019 novel coronavirus disease (COVID-19) 05/20/2020   Pneumonia due to COVID-19 virus 04/03/2020   Elevated troponin 04/03/2020   Acquired trigger finger 06/08/2019   Lymphedema 06/08/2019   Anemia 04/17/2019   Swelling of both lower extremities 01/10/2019   Facial droop 04/09/2018   Daytime somnolence 03/30/2018   Carotid artery disease (HCC) 02/03/2018   Intracranial vascular stenosis 09/12/2017   Cough 01/20/2017   Bell's palsy 11/10/2016   History of CVA (cerebrovascular accident) 11/10/2016   Benign localized hyperplasia of prostate with urinary obstruction 10/27/2016   History of nephrolithiasis 10/27/2016   TIA (transient ischemic attack) 10/20/2016   Near syncope 06/23/2016   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  Rationale for Evaluation and Treatment: Rehabilitation  SUBJECTIVE:                                                                                                                                                                                             SUBJECTIVE STATEMENT: Pt reports no changes  since last visit. He denies reports of stumbles/falls. He rates his pain as 0/10.     Pt accompanied by: self  PERTINENT HISTORY: Patient returning to PT after hospitalization after being off HD for past 3 months with end stage ESRD. PMH DM, HTN, Anemia, Bells palsy, Hypothyroidism, Kidney stones, stroke and pseudotumour cerebri. PAIN:  Are you having pain? No  PRECAUTIONS: Fall  RED FLAGS: None   WEIGHT BEARING RESTRICTIONS: No  FALLS: Has patient fallen in last 6 months? No  PATIENT GOALS: to get LLE stronger and more steady  OBJECTIVE:   TODAY'S TREATMENT:                                                                                                                              DATE: 07/30/23  TherEx: -   NMR:  -        PATIENT EDUCATION: Education details: goals, POC,  Person educated: Patient Education method: Explanation, Demonstration, Tactile cues, and Verbal cues Education comprehension: verbalized understanding, returned demonstration, verbal cues required, and tactile cues required  HOME EXERCISE PROGRAM: Access Code: [facility] URL: [email or web address] Date: 06/17/2023 Prepared by: Maureen Ralphs  Exercises - Standing Balance in Corner with Eyes Closed  - 1 x daily - 2-3 x weekly - 3 sets - up to 30 sec hold - Standing Balance in Corner  - 1 x daily - 2-3 x weekly - 3 sets - 30 sec hold - Corner Balance Feet Apart: Eyes Open With Head Turns  - 1 x daily - 2-3 x weekly - 3 sets - 10 reps - Sit to Stand with Arms Crossed  - 1 x daily - 3 x weekly - 3 sets - 10 reps GOALS: Goals reviewed with patient? Yes  SHORT TERM GOALS:  Target date: 06/17/2023   Patient will be independent in home exercise program to improve strength/mobility for better functional independence with ADLs. Baseline: 11/19: compliant  Goal status: MET   LONG TERM GOALS: Target date: 09/09/2023   Patient will increase FOTO score to equal to or greater  than   72%  to demonstrate statistically significant improvement in mobility and quality of life.  Baseline: 9/24: 65% 11/19: 60%  Goal status: In progress  2.   Patient (< 16 years old) will complete five times sit to stand test in < 10 seconds indicating an increased LE strength and improved balance. Baseline: 9/24: 20.23 seconds no hands, one near LOB 11/19:15.02 seconds no hands Goal status: Partially Met  3.   Patient will increase Berg Balance score by >45/56 to demonstrate decreased fall risk during functional activities.  Baseline: 39 11/19: 44 Goal status: Partially Met   4.    Patient will increase six minute walk test distance to >1400 for progression to age norm community ambulator and improve gait ability Baseline: 9/24: 1060 11/19: 1070 ft  Goal status: In Progress   5.    Patient will be independent with ascend/descend 12 steps using single UE in step over step pattern without LOB.  Baseline:11/19: sometimes has to use hands and feet like a ladder to get up the stairs  Goal status: INITIAL   ASSESSMENT:  CLINICAL IMPRESSION: Patient presents motivated and making progress towards his goals. Goal assessment performed on recert date of 11/19.  His balance was slightly more impaired compared to previous sessions evidenced by the inability to perform weighted step ups without a loss of balance. As repetitions increased, he was able to perform activities with less instances of LOB. There is increased eversion of the ankle throughout strengthening and balance activities which impacts his stability. With performance of dynamic balance activities, his path deviates to the left requiring verbal cues to maintain a straight path. Patient will benefit from skilled physical therapy to improve strength, stability and mobility. Patient's condition has the potential to improve in response to therapy. Maximum improvement is yet to be obtained. The anticipated improvement is attainable and  reasonable in a generally predictable time.    OBJECTIVE IMPAIRMENTS: Abnormal gait, cardiopulmonary status limiting activity, decreased activity tolerance, decreased balance, decreased coordination, decreased mobility, difficulty walking, decreased strength, impaired flexibility, impaired sensation, impaired vision/preception, improper body mechanics, and postural dysfunction.   ACTIVITY LIMITATIONS: carrying, lifting, bending, sitting, standing, squatting, stairs, transfers, bed mobility, dressing, reach over head, hygiene/grooming, locomotion level, and caring for others  PARTICIPATION LIMITATIONS: meal prep, cleaning, laundry, personal finances, driving, shopping, community activity, occupation, and yard work  PERSONAL FACTORS: Age, Fitness, Past/current experiences, Time since onset of injury/illness/exacerbation, and 3+ comorbidities: DM, HTN, Anemia, Bells palsy, Hypothyroidism, Kidney stones, stroke and pseudotumour cerebri  are also affecting patient's functional outcome.   REHAB POTENTIAL: Good  CLINICAL DECISION MAKING: Evolving/moderate complexity  EVALUATION COMPLEXITY: Moderate  PLAN:  PT FREQUENCY: 2x/week  PT DURATION: 8 weeks  PLANNED INTERVENTIONS: Therapeutic exercises, Therapeutic activity, Neuromuscular re-education, Balance training, Gait training, Patient/Family education, Self Care, Joint mobilization, Stair training, Vestibular training, Canalith repositioning, Visual/preceptual remediation/compensation, Orthotic/Fit training, DME instructions, Dry Needling, Spinal mobilization, Cryotherapy, Moist heat, Splintting, Taping, Traction, Manual therapy, and Re-evaluation  PLAN FOR NEXT SESSION:  hip flexor strength and ankle strength, balance, resisted multiplanar walking, L LE high level dynamic weight shifting.    Randon Goldsmith, Student-PT Randon Goldsmith, Student-PT 07/30/2023, 10:44 AM  10:44 AM, 07/30/23  This entire session was performed under direct  supervision and direction of a licensed therapist/therapist assistant . I have personally read, edited and approve of the note as written. Temple Pacini PT, DPT    10:44 AM, 07/30/23 Temple Pacini PT, DPT  Physical Therapist - [facility]  Outpatient Physical Therapy- Main Campus [phone]

## 2023-07-31 ENCOUNTER — Ambulatory Visit: Payer: Medicare Other

## 2023-07-31 DIAGNOSIS — M6281 Muscle weakness (generalized): Secondary | ICD-10-CM

## 2023-07-31 DIAGNOSIS — R269 Unspecified abnormalities of gait and mobility: Secondary | ICD-10-CM

## 2023-07-31 DIAGNOSIS — R278 Other lack of coordination: Secondary | ICD-10-CM

## 2023-07-31 DIAGNOSIS — R262 Difficulty in walking, not elsewhere classified: Secondary | ICD-10-CM

## 2023-07-31 DIAGNOSIS — R2681 Unsteadiness on feet: Secondary | ICD-10-CM

## 2023-08-04 ENCOUNTER — Telehealth: Payer: Self-pay | Admitting: Internal Medicine

## 2023-08-04 ENCOUNTER — Ambulatory Visit: Payer: Medicare Other

## 2023-08-04 NOTE — Telephone Encounter (Signed)
Pt called stating he would like to be called regarding his meds

## 2023-08-05 ENCOUNTER — Emergency Department: Payer: Medicare Other

## 2023-08-05 ENCOUNTER — Encounter: Payer: Self-pay | Admitting: Emergency Medicine

## 2023-08-05 ENCOUNTER — Other Ambulatory Visit: Payer: Self-pay

## 2023-08-05 ENCOUNTER — Emergency Department
Admission: EM | Admit: 2023-08-05 | Discharge: 2023-08-05 | Discharge status: Against Medical Advice | Payer: Medicare Other | Attending: Emergency Medicine | Admitting: Emergency Medicine

## 2023-08-05 ENCOUNTER — Telehealth (INDEPENDENT_AMBULATORY_CARE_PROVIDER_SITE_OTHER): Payer: Medicare Other | Admitting: Internal Medicine

## 2023-08-05 ENCOUNTER — Ambulatory Visit: Payer: Medicare Other

## 2023-08-05 VITALS — BP 134/76 | Ht 72.0 in | Wt 253.0 lb

## 2023-08-05 DIAGNOSIS — R4781 Slurred speech: Secondary | ICD-10-CM | POA: Insufficient documentation

## 2023-08-05 DIAGNOSIS — E039 Hypothyroidism, unspecified: Secondary | ICD-10-CM

## 2023-08-05 DIAGNOSIS — Z5321 Procedure and treatment not carried out due to patient leaving prior to being seen by health care provider: Secondary | ICD-10-CM | POA: Diagnosis not present

## 2023-08-05 DIAGNOSIS — N186 End stage renal disease: Secondary | ICD-10-CM

## 2023-08-05 DIAGNOSIS — E782 Mixed hyperlipidemia: Secondary | ICD-10-CM

## 2023-08-05 DIAGNOSIS — I779 Disorder of arteries and arterioles, unspecified: Secondary | ICD-10-CM

## 2023-08-05 DIAGNOSIS — I10 Essential (primary) hypertension: Secondary | ICD-10-CM

## 2023-08-05 DIAGNOSIS — G629 Polyneuropathy, unspecified: Secondary | ICD-10-CM | POA: Diagnosis not present

## 2023-08-05 DIAGNOSIS — Z992 Dependence on renal dialysis: Secondary | ICD-10-CM

## 2023-08-05 DIAGNOSIS — I4891 Unspecified atrial fibrillation: Secondary | ICD-10-CM

## 2023-08-05 DIAGNOSIS — Z8673 Personal history of transient ischemic attack (TIA), and cerebral infarction without residual deficits: Secondary | ICD-10-CM

## 2023-08-05 DIAGNOSIS — E1122 Type 2 diabetes mellitus with diabetic chronic kidney disease: Secondary | ICD-10-CM

## 2023-08-05 DIAGNOSIS — E08319 Diabetes mellitus due to underlying condition with unspecified diabetic retinopathy without macular edema: Secondary | ICD-10-CM

## 2023-08-05 DIAGNOSIS — Z794 Long term (current) use of insulin: Secondary | ICD-10-CM

## 2023-08-05 LAB — COMPREHENSIVE METABOLIC PANEL
ALT: 9 U/L (ref 0–44)
AST: 13 U/L — ABNORMAL LOW (ref 15–41)
Albumin: 3.7 g/dL (ref 3.5–5.0)
Alkaline Phosphatase: 72 U/L (ref 38–126)
Anion gap: 13 (ref 5–15)
BUN: 51 mg/dL — ABNORMAL HIGH (ref 6–20)
CO2: 22 mmol/L (ref 22–32)
Calcium: 7.4 mg/dL — ABNORMAL LOW (ref 8.9–10.3)
Chloride: 105 mmol/L (ref 98–111)
Creatinine, Ser: 9.52 mg/dL — ABNORMAL HIGH (ref 0.61–1.24)
GFR, Estimated: 6 mL/min — ABNORMAL LOW (ref >60)
Glucose, Bld: 148 mg/dL — ABNORMAL HIGH (ref 70–99)
Potassium: 4.4 mmol/L (ref 3.5–5.1)
Sodium: 140 mmol/L (ref 135–145)
Total Bilirubin: 0.7 mg/dL (ref <1.2)
Total Protein: 6.9 g/dL (ref 6.5–8.1)

## 2023-08-05 LAB — DIFFERENTIAL
Abs Immature Granulocytes: 0.01 10*3/uL (ref 0.00–0.07)
Basophils Absolute: 0.1 10*3/uL (ref 0.0–0.1)
Basophils Relative: 1 %
Eosinophils Absolute: 0.1 10*3/uL (ref 0.0–0.5)
Eosinophils Relative: 3 %
Immature Granulocytes: 0 %
Lymphocytes Relative: 26 %
Lymphs Abs: 1.2 10*3/uL (ref 0.7–4.0)
Monocytes Absolute: 0.4 10*3/uL (ref 0.1–1.0)
Monocytes Relative: 9 %
Neutro Abs: 2.7 10*3/uL (ref 1.7–7.7)
Neutrophils Relative %: 61 %

## 2023-08-05 LAB — ETHANOL: Alcohol, Ethyl (B): <10 mg/dL (ref <10)

## 2023-08-05 LAB — APTT: aPTT: 38 s — ABNORMAL HIGH (ref 24–36)

## 2023-08-05 LAB — CBC
HCT: 43.9 % (ref 39.0–52.0)
Hemoglobin: 14.5 g/dL (ref 13.0–17.0)
MCH: 30.4 pg (ref 26.0–34.0)
MCHC: 33 g/dL (ref 30.0–36.0)
MCV: 92 fL (ref 80.0–100.0)
Platelets: 227 10*3/uL (ref 150–400)
RBC: 4.77 MIL/uL (ref 4.22–5.81)
RDW: 14.1 % (ref 11.5–15.5)
WBC: 4.4 10*3/uL (ref 4.0–10.5)
nRBC: 0 % (ref 0.0–0.2)

## 2023-08-05 LAB — PROTIME-INR
INR: 1.3 — ABNORMAL HIGH (ref 0.8–1.2)
Prothrombin Time: 16.4 s — ABNORMAL HIGH (ref 11.4–15.2)

## 2023-08-05 MED ORDER — SODIUM CHLORIDE 0.9% FLUSH: 3.0000 mL | Freq: Once | INTRAVENOUS | Status: DC

## 2023-08-05 NOTE — ED Triage Notes (Signed)
Patient to ED via POV for slurred speech that occurred yesterday. LKW was 12/8 when patient went to bed at 1030pm. PT states PCP told him to come to ED. Slurred speech has not resolved. Denies weakness or numbness.

## 2023-08-05 NOTE — Progress Notes (Signed)
Patient ID: Gregory Crane, male   DOB: 09/22/1970, 52 y.o.   MRN: [id number]   Virtual Visit via video Note  I connected with Gregory Crane by a video enabled telemedicine application and verified that I am speaking with the correct person using two identifiers. Location patient: home Location provider: work  Persons participating in the virtual visit: patient, provider  The limitations, risks, security and privacy concerns of performing an evaluation and management service by video and the availability of in person appointments have been discussed. It has also been discussed with the patient that there may be a patient responsible charge related to this service. The patient expressed understanding and agreed to proceed.   Reason for visit: work in appt  HPI: Work in appt - work in to discuss medications. Was admitted 07/02/23 - 07/03/23 - with afib with RVR. Was noted at dialysis to be in afib with RVR. Started on diltiazem drip and metoprolol was added. Cardiology consulted. Converted to SR. Started on eliquis. Metoprolol adjusted secondary to bradycardia. Troponin increased due to demand ischemia. Started on lasix by nephrology and lopressor by cardiology. Continues on hydralazine and amlodipine decreased to 5mg  q day.  Reports that recently, he has been feeling weaker. Decreased energy. Also noticed slurring of speech starting yesterday. Head felt funny/feels funny. No chest pain.  Breathing stable. Going to dialysis. In classes for PD. He is off lyrica. Just stopped medication.    ROS: See pertinent positives and negatives per HPI.  Past Medical History:  Diagnosis Date   Allergy    Anemia    Bell's palsy    Diabetes mellitus without complication (HCC)    diet controlled   Hypertension    Hypothyroidism    Kidney stones    Pseudotumor cerebri    Stroke [facility])     Past Surgical History:  Procedure Laterality Date   COLONOSCOPY WITH PROPOFOL N/A 03/30/2020   Procedure: COLONOSCOPY  WITH PROPOFOL;  Surgeon: Regis Bill, MD;  Location: [facility] ENDOSCOPY;  Service: Endoscopy;  Laterality: N/A;   LOOP RECORDER INSERTION N/A 01/27/2018   Procedure: LOOP RECORDER INSERTION;  Surgeon: Duke Salvia, MD;  Location: [facility] INVASIVE CV LAB;  Service: Cardiovascular;  Laterality: N/A;   LUMBAR PUNCTURE     as child   NO PAST SURGERIES     TEE WITHOUT CARDIOVERSION N/A 01/07/2018   Procedure: TRANSESOPHAGEAL ECHOCARDIOGRAM (TEE);  Surgeon: Antonieta Iba, MD;  Location: [facility] ORS;  Service: Cardiovascular;  Laterality: N/A;    Family History  Problem Relation Age of Onset   Breast cancer Mother    Diabetes Father    Diabetes Sister    Arthritis Maternal Grandmother    Diabetes Maternal Grandmother     SOCIAL HX: reviewed.    Current Outpatient Medications:    pregabalin (LYRICA) 100 MG capsule, Take 100 mg by mouth daily., Disp: , Rfl:    amLODipine (NORVASC) 10 MG tablet, Take 0.5 tablets (5 mg total) by mouth daily. Reduced from 10 mg., Disp: , Rfl:    apixaban (ELIQUIS) 5 MG TABS tablet, Take 1 tablet (5 mg total) by mouth 2 (two) times daily., Disp: 60 tablet, Rfl: 2   furosemide (LASIX) 40 MG tablet, Take 1 tablet (40 mg total) by mouth 2 (two) times daily., Disp: 60 tablet, Rfl: 2   hydrALAZINE (APRESOLINE) 50 MG tablet, Take 0.5 tablets (25 mg total) by mouth 2 (two) times daily. Home med., Disp: , Rfl:    levothyroxine (SYNTHROID) 175  MCG tablet, TAKE 1 TABLET (175 MCG) BY MOUTH ONCE DAILY BEFORE BREAKFAST., Disp: 90 tablet, Rfl: 0   metoprolol tartrate (LOPRESSOR) 25 MG tablet, Take 1 tablet (25 mg total) by mouth 2 (two) times daily., Disp: 60 tablet, Rfl: 2   RENVELA 800 MG tablet, Take 800 mg by mouth 3 (three) times daily., Disp: , Rfl:    rosuvastatin (CRESTOR) 5 MG tablet, Take 5 mg by mouth daily., Disp: , Rfl:   EXAM:  GENERAL: alert, oriented, appears well and in no acute distress  HEENT: atraumatic, conjunttiva clear, no obvious abnormalities on  inspection of external nose and ears  NECK: normal movements of the head and neck  LUNGS: on inspection no signs of respiratory distress, breathing rate appears normal, no obvious gross SOB, gasping or wheezing  CV: no obvious cyanosis  PSYCH/NEURO: pleasant and cooperative, no obvious depression or anxiety, speech and thought processing grossly intact  ASSESSMENT AND PLAN:  Discussed the following assessment and plan:  Problem List Items Addressed This Visit     Atrial fibrillation (HCC)   Previous afib as outlined. On eliquis and metoprolol.  Follow.       Carotid artery disease (HCC)   Last carotid ultrasound - <50% bilaterally.  Needs to take statin and keep blood pressure under control.       Diabetic retinopathy associated with diabetes mellitus due to underlying condition [facility])   Sees endocrinology. Low carb diet.  Dexcom.  Follow.  Most recent A1c 5.9. Continue f/u with ophthalmology.       ESRD on hemodialysis [facility])   Continue hemodialysis.  Followed by nephrology.  Follows labs through dialysis. In class for plans for PD.       Essential hypertension   Blood pressure has been doing better.  Continue hydralazine and amlodipine.  On 1/2 dose of amlodipine now. Follow pressures.  Follow metabolic panel.       History of CVA (cerebrovascular accident)   History of CVA with previous left side weakness. Continues on aspirin and lipitor.  Continue blood pressure control.  With recent increased fatigue, not feeling well, head feeling funny and slurring of speech, discussed my concern regarding possible recurring CVA/TIA. Discussed the need for further w/up and evaluation now.  Agreed for evaluation in ER.  ER notified.       Hyperlipidemia   Low cholesterol diet and exercise.  Follow lipid panel and liver function tests.        Hypothyroidism   On thyroid replacement.  Follow tsh. Instructed on importance of taking regularly.        Neuropathy   Has been on lyrica  for a long time.  Just recently abruptly stopped. Discussed the need to taper lyrica if going to come off the medication. Pursue further w/up in ER as outlined. Will need to determine if need for restarting lyrica.       Type 2 diabetes mellitus, with long-term current use of insulin (HCC) - Primary   Low carb diet and exercise.  No change in medication.  Seeing endocrinology.  Last A1c last check -04/28/23 - 5.9. . Follow met b and a1c.       Return if symptoms worsen or fail to improve.   I discussed the assessment and treatment plan with the patient. The patient was provided an opportunity to ask questions and all were answered. The patient agreed with the plan and demonstrated an understanding of the instructions.   The patient was advised to call  back or seek an in-person evaluation if the symptoms worsen or if the condition fails to improve as anticipated.   Dale [place], MD

## 2023-08-05 NOTE — Telephone Encounter (Signed)
LMTCB

## 2023-08-06 ENCOUNTER — Ambulatory Visit: Payer: Medicare Other

## 2023-08-06 NOTE — Telephone Encounter (Signed)
Virtual visit done with Dr Lorin Picket to go over his medication yesterday

## 2023-08-06 NOTE — Therapy (Incomplete)
OUTPATIENT PHYSICAL THERAPY TREATMENT   Patient Name: Gregory Crane MRN: [id number] DOB:1971-07-15, 52 y.o., male Today's Date: 08/06/2023   PCP: Dale [place] MD REFERRING PROVIDER: Dale  MD  END OF SESSION:           Past Medical History:  Diagnosis Date   Allergy    Anemia    Bell's palsy    Diabetes mellitus without complication (HCC)    diet controlled   Hypertension    Hypothyroidism    Kidney stones    Pseudotumor cerebri    Stroke [facility])    Past Surgical History:  Procedure Laterality Date   COLONOSCOPY WITH PROPOFOL N/A 03/30/2020   Procedure: COLONOSCOPY WITH PROPOFOL;  Surgeon: Regis Bill, MD;  Location: [facility] ENDOSCOPY;  Service: Endoscopy;  Laterality: N/A;   LOOP RECORDER INSERTION N/A 01/27/2018   Procedure: LOOP RECORDER INSERTION;  Surgeon: Duke Salvia, MD;  Location: [facility] INVASIVE CV LAB;  Service: Cardiovascular;  Laterality: N/A;   LUMBAR PUNCTURE     as child   NO PAST SURGERIES     TEE WITHOUT CARDIOVERSION N/A 01/07/2018   Procedure: TRANSESOPHAGEAL ECHOCARDIOGRAM (TEE);  Surgeon: Antonieta Iba, MD;  Location: [facility] ORS;  Service: Cardiovascular;  Laterality: N/A;   Patient Active Problem List   Diagnosis Date Noted   Atrial fibrillation with rapid ventricular response (HCC) 07/03/2023   Atrial fibrillation with RVR (HCC) 07/02/2023   NSTEMI (non-ST elevated myocardial infarction) (HCC) 07/02/2023   Fall 06/15/2023   Non-compliance with renal dialysis (HCC) 05/02/2023   Obesity (BMI 30-39.9) 04/30/2023   Volume overload 04/29/2023   Renal osteodystrophy 11/25/2022   Unsteady gait 10/05/2022   Steal syndrome of dialysis vascular access (HCC) 05/28/2022   Hydronephrosis, left 05/04/2022   Leukocytosis 05/04/2022   Pre-op evaluation 04/24/2022   Chest pain 01/13/2022   Diabetic retinopathy associated with diabetes mellitus due to underlying condition (HCC) 01/13/2022   ESRD on hemodialysis (HCC) 01/13/2022    Deafness in right ear 08/11/2021   Open wound 01/25/2021   History of colon polyps 10/15/2020   Postoperative hemorrhage involving digestive system following digestive system procedure 09/19/2020   Acute cholecystitis without calculus 09/09/2020   Type 2 diabetes mellitus, with long-term current use of insulin (HCC) 09/09/2020   Cryptogenic stroke (HCC) 07/13/2020   History of loop recorder 07/13/2020   History of 2019 novel coronavirus disease (COVID-19) 05/20/2020   Pneumonia due to COVID-19 virus 04/03/2020   Elevated troponin 04/03/2020   Acquired trigger finger 06/08/2019   Lymphedema 06/08/2019   Anemia 04/17/2019   Swelling of both lower extremities 01/10/2019   Facial droop 04/09/2018   Daytime somnolence 03/30/2018   Carotid artery disease (HCC) 02/03/2018   Intracranial vascular stenosis 09/12/2017   Cough 01/20/2017   Bell's palsy 11/10/2016   History of CVA (cerebrovascular accident) 11/10/2016   Benign localized hyperplasia of prostate with urinary obstruction 10/27/2016   History of nephrolithiasis 10/27/2016   TIA (transient ischemic attack) 10/20/2016   Near syncope 06/23/2016   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  Rationale for Evaluation and Treatment: Rehabilitation  SUBJECTIVE:  SUBJECTIVE STATEMENT: Patient arrived around 12 minutes late to today's session caught in traffic. Pt reports no changes since last  visit including stumbles/falls. He rates his pain as 0/10. After session, he is going to his dialysis appointment. He has started his training PD sessions at the dialysis center, done one so far, without difficult/complication.     Pt accompanied by: self  PERTINENT HISTORY: Patient returning to PT after hospitalization after being off HD for past 3 months with end stage ESRD. PMH DM, HTN, Anemia, Bells palsy, Hypothyroidism, Kidney stones, stroke and pseudotumour cerebri. PAIN:  Are you having pain? No  PRECAUTIONS: Fall  RED FLAGS: None   WEIGHT BEARING RESTRICTIONS: No  FALLS: Has patient fallen in last 6 months? No  PATIENT GOALS: to get LLE stronger and more steady  OBJECTIVE:   TODAY'S TREATMENT:                                                                                                                              DATE: 08/06/23 Starting BP: 151/66 mmHg 57 bpm  TherEx: -Step ups with 3# aw 2x10 each LE, RPE 3/10, as repetitions increased pt's stability improved  -Heel raises 3#aw 2x10 each LE     NMR:  - Foot taps from airex onto two hedgehogs in front of patient 2x20 alternating LE, first rep with full RUE support, second rep with two finger RUE support  -Wall rebound + squat on firm surface 2x15, Min Assist throughout first set due to anterior and posterior LOB -Wall rebound on Airex 1x15 -Wall rebound +squat on Airex1x15 -Split stance with 6 inch step and Airex, 2x30s each LE in front, second set with dual task of rearranging letters on whiteboard, eversion of ankles throughout  -Ambulation in hallway with balance components  -Horizontal head turns x1,  -Vertical head turns x1  -Eyes closed x1  - Backwards walking x1, most difficult task, trendelenburg gait and decreased hip extension, uses stepping strategy across midline to regain balance independently  -Path deviation to the left throughout   Post session RPE: 5/10    PATIENT EDUCATION: Education  details: goals, POC,  Person educated: Patient Education method: Explanation, Demonstration, Tactile cues, and Verbal cues Education comprehension: verbalized understanding, returned demonstration, verbal cues required, and tactile cues required  HOME EXERCISE PROGRAM: Access Code: [facility] URL: [email or web address] Date: 06/17/2023 Prepared by: Maureen Ralphs  Exercises - Standing Balance in Corner with Eyes Closed  - 1 x daily - 2-3 x weekly - 3 sets - up to 30 sec hold - Standing Balance in Corner  - 1 x daily - 2-3 x weekly - 3 sets - 30 sec hold - Corner Balance Feet Apart: Eyes Open With Head Turns  - 1 x daily - 2-3 x weekly - 3 sets - 10 reps - Sit to Stand with Arms Crossed  - 1 x daily - 3 x weekly - 3 sets - 10 reps GOALS: Goals reviewed  with patient? Yes  SHORT TERM GOALS: Target date: 06/17/2023   Patient will be independent in home exercise program to improve strength/mobility for better functional independence with ADLs. Baseline: 11/19: compliant  Goal status: MET   LONG TERM GOALS: Target date: 09/09/2023   Patient will increase FOTO score to equal to or greater than   72%  to demonstrate statistically significant improvement in mobility and quality of life.  Baseline: 9/24: 65% 11/19: 60%  Goal status: In progress  2.   Patient (< 67 years old) will complete five times sit to stand test in < 10 seconds indicating an increased LE strength and improved balance. Baseline: 9/24: 20.23 seconds no hands, one near LOB 11/19:15.02 seconds no hands Goal status: Partially Met  3.   Patient will increase Berg Balance score by >45/56 to demonstrate decreased fall risk during functional activities.  Baseline: 39 11/19: 44 Goal status: Partially Met   4.    Patient will increase six minute walk test distance to >1400 for progression to age norm community ambulator and improve gait ability Baseline: 9/24: 1060 11/19: 1070 ft  Goal status: In Progress    5.    Patient will be independent with ascend/descend 12 steps using single UE in step over step pattern without LOB.  Baseline:11/19: sometimes has to use hands and feet like a ladder to get up the stairs  Goal status: INITIAL   ASSESSMENT:  CLINICAL IMPRESSION: Patient presents motivated and making progress towards his goals. Today's session limited by late arrival. With performance of backwards walking, there is a prominent left trendelenburg which indicates left hip involvement in a few instances of LOB. Pt uses stepping strategies and requires Min assist throughout activities to regain balance. As repetitions increase there is improved motor control performance evidenced by less instances of LOB. Patient will benefit from skilled physical therapy to improve strength, decrease risk of falls, and improve functional mobility.    OBJECTIVE IMPAIRMENTS: Abnormal gait, cardiopulmonary status limiting activity, decreased activity tolerance, decreased balance, decreased coordination, decreased mobility, difficulty walking, decreased strength, impaired flexibility, impaired sensation, impaired vision/preception, improper body mechanics, and postural dysfunction.   ACTIVITY LIMITATIONS: carrying, lifting, bending, sitting, standing, squatting, stairs, transfers, bed mobility, dressing, reach over head, hygiene/grooming, locomotion level, and caring for others  PARTICIPATION LIMITATIONS: meal prep, cleaning, laundry, personal finances, driving, shopping, community activity, occupation, and yard work  PERSONAL FACTORS: Age, Fitness, Past/current experiences, Time since onset of injury/illness/exacerbation, and 3+ comorbidities: DM, HTN, Anemia, Bells palsy, Hypothyroidism, Kidney stones, stroke and pseudotumour cerebri  are also affecting patient's functional outcome.   REHAB POTENTIAL: Good  CLINICAL DECISION MAKING: Evolving/moderate complexity  EVALUATION COMPLEXITY: Moderate  PLAN:  PT  FREQUENCY: 2x/week  PT DURATION: 8 weeks  PLANNED INTERVENTIONS: Therapeutic exercises, Therapeutic activity, Neuromuscular re-education, Balance training, Gait training, Patient/Family education, Self Care, Joint mobilization, Stair training, Vestibular training, Canalith repositioning, Visual/preceptual remediation/compensation, Orthotic/Fit training, DME instructions, Dry Needling, Spinal mobilization, Cryotherapy, Moist heat, Splintting, Taping, Traction, Manual therapy, and Re-evaluation  PLAN FOR NEXT SESSION:  hip flexor strength and ankle strength, balance, resisted multiplanar walking, L LE high level dynamic weight shifting   Randon Goldsmith, Student-PT Kaarin Pardy, Student-PT 08/06/2023, 10:52 AM  10:52 AM, 08/06/23   This entire session was performed under direct supervision and direction of a licensed therapist/therapist assistant . I have personally read, edited and approve of the note as written.  10:52 AM, 08/06/23 Rosamaria Lints, PT, DPT Physical Therapist - [facility]  Outpatient Physical Therapy- Main Campus [phone]       Physical Therapist - [facility]  Outpatient Physical Therapy- Main Campus [phone]

## 2023-08-07 ENCOUNTER — Ambulatory Visit: Payer: Medicare Other

## 2023-08-10 ENCOUNTER — Encounter: Payer: Self-pay | Admitting: Internal Medicine

## 2023-08-10 DIAGNOSIS — I4891 Unspecified atrial fibrillation: Secondary | ICD-10-CM | POA: Insufficient documentation

## 2023-08-10 DIAGNOSIS — I471 Supraventricular tachycardia, unspecified: Secondary | ICD-10-CM | POA: Insufficient documentation

## 2023-08-10 NOTE — Assessment & Plan Note (Signed)
Low cholesterol diet and exercise.  Follow lipid panel and liver function tests.  

## 2023-08-10 NOTE — Assessment & Plan Note (Signed)
On thyroid replacement.  Follow tsh. Instructed on importance of taking regularly.

## 2023-08-10 NOTE — Assessment & Plan Note (Signed)
Continue hemodialysis.  Followed by nephrology.  Follows labs through dialysis. In class for plans for PD.

## 2023-08-10 NOTE — Assessment & Plan Note (Signed)
Low carb diet and exercise.  No change in medication.  Seeing endocrinology.  Last A1c last check -04/28/23 - 5.9. . Follow met b and a1c.

## 2023-08-10 NOTE — Assessment & Plan Note (Signed)
Sees endocrinology. Low carb diet.  Dexcom.  Follow.  Most recent A1c 5.9. Continue f/u with ophthalmology.

## 2023-08-10 NOTE — Assessment & Plan Note (Signed)
Previous afib as outlined. On eliquis and metoprolol.  Follow.

## 2023-08-10 NOTE — Assessment & Plan Note (Signed)
Blood pressure has been doing better.  Continue hydralazine and amlodipine.  On 1/2 dose of amlodipine now. Follow pressures.  Follow metabolic panel.

## 2023-08-10 NOTE — Assessment & Plan Note (Signed)
Last carotid ultrasound - <50% bilaterally.  Needs to take statin and keep blood pressure under control.

## 2023-08-10 NOTE — Assessment & Plan Note (Signed)
Has been on lyrica for a long time.  Just recently abruptly stopped. Discussed the need to taper lyrica if going to come off the medication. Pursue further w/up in ER as outlined. Will need to determine if need for restarting lyrica.

## 2023-08-10 NOTE — Assessment & Plan Note (Signed)
History of CVA with previous left side weakness. Continues on aspirin and lipitor.  Continue blood pressure control.  With recent increased fatigue, not feeling well, head feeling funny and slurring of speech, discussed my concern regarding possible recurring CVA/TIA. Discussed the need for further w/up and evaluation now.  Agreed for evaluation in ER.  ER notified.

## 2023-08-11 ENCOUNTER — Ambulatory Visit: Payer: Medicare Other

## 2023-08-11 NOTE — Therapy (Signed)
OUTPATIENT PHYSICAL THERAPY TREATMENT   Patient Name: Gregory Crane MRN: [id number] DOB:February 20, 1971, 52 y.o., male Today's Date: 08/12/2023   PCP: Dale [place] MD REFERRING PROVIDER: Dale [facility] MD  END OF SESSION:  PT End of Session - 08/12/23 0859     Visit Number 12    Number of Visits 24    Date for PT Re-Evaluation 09/09/23    Authorization Type Medicare (trad)    Authorization Time Period 07/15/23-09/09/23    PT Start Time 0859    PT Stop Time 0945    PT Time Calculation (min) 46 min    Equipment Utilized During Treatment Gait belt    Activity Tolerance Patient tolerated treatment well    Behavior During Therapy WFL for tasks assessed/performed                     Past Medical History:  Diagnosis Date   Allergy    Anemia    Bell's palsy    Diabetes mellitus without complication (HCC)    diet controlled   Hypertension    Hypothyroidism    Kidney stones    Pseudotumor cerebri    Stroke [facility])    Past Surgical History:  Procedure Laterality Date   COLONOSCOPY WITH PROPOFOL N/A 03/30/2020   Procedure: COLONOSCOPY WITH PROPOFOL;  Surgeon: Regis Bill, MD;  Location: [facility] ENDOSCOPY;  Service: Endoscopy;  Laterality: N/A;   LOOP RECORDER INSERTION N/A 01/27/2018   Procedure: LOOP RECORDER INSERTION;  Surgeon: Duke Salvia, MD;  Location: [facility] INVASIVE CV LAB;  Service: Cardiovascular;  Laterality: N/A;   LUMBAR PUNCTURE     as child   NO PAST SURGERIES     TEE WITHOUT CARDIOVERSION N/A 01/07/2018   Procedure: TRANSESOPHAGEAL ECHOCARDIOGRAM (TEE);  Surgeon: Antonieta Iba, MD;  Location: [facility] ORS;  Service: Cardiovascular;  Laterality: N/A;   Patient Active Problem List   Diagnosis Date Noted   Atrial fibrillation (HCC) 08/10/2023   Atrial fibrillation with rapid ventricular response (HCC) 07/03/2023   Atrial fibrillation with RVR (HCC) 07/02/2023   NSTEMI (non-ST elevated myocardial infarction) (HCC) 07/02/2023   Fall 06/15/2023    Non-compliance with renal dialysis (HCC) 05/02/2023   Obesity (BMI 30-39.9) 04/30/2023   Volume overload 04/29/2023   Renal osteodystrophy 11/25/2022   Unsteady gait 10/05/2022   Steal syndrome of dialysis vascular access (HCC) 05/28/2022   Hydronephrosis, left 05/04/2022   Leukocytosis 05/04/2022   Pre-op evaluation 04/24/2022   Chest pain 01/13/2022   Diabetic retinopathy associated with diabetes mellitus due to underlying condition (HCC) 01/13/2022   ESRD on hemodialysis (HCC) 01/13/2022   Deafness in right ear 08/11/2021   Open wound 01/25/2021   History of colon polyps 10/15/2020   Postoperative hemorrhage involving digestive system following digestive system procedure 09/19/2020   Acute cholecystitis without calculus 09/09/2020   Type 2 diabetes mellitus, with long-term current use of insulin (HCC) 09/09/2020   Cryptogenic stroke (HCC) 07/13/2020   History of loop recorder 07/13/2020   History of 2019 novel coronavirus disease (COVID-19) 05/20/2020   Pneumonia due to COVID-19 virus 04/03/2020   Elevated troponin 04/03/2020   Acquired trigger finger 06/08/2019   Lymphedema 06/08/2019   Anemia 04/17/2019   Swelling of both lower extremities 01/10/2019   Facial droop 04/09/2018   Daytime somnolence 03/30/2018   Carotid artery disease (HCC) 02/03/2018   Intracranial vascular stenosis 09/12/2017   Cough 01/20/2017   Bell's palsy 11/10/2016   History of CVA (cerebrovascular accident) 11/10/2016   Benign  localized hyperplasia of prostate with urinary obstruction 10/27/2016   History of nephrolithiasis 10/27/2016   TIA (transient ischemic attack) 10/20/2016   Near syncope 06/23/2016   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   Neuropathy 08/06/2015   Health care maintenance 08/06/2015   Essential hypertension 08/06/2015   Heme positive stool 10/10/2013   Hypothyroidism 10/10/2013   Microalbuminuria 10/10/2013   Hyperlipidemia 10/10/2013   B12 deficiency 10/10/2013   Environmental  allergies 07/04/2013    ONSET DATE: 3 years ago  REFERRING DIAG: CVA  THERAPY DIAG:  Muscle weakness (generalized)  Unsteadiness on feet  Difficulty in walking, not elsewhere classified  Rationale for Evaluation and Treatment: Rehabilitation  SUBJECTIVE:                                                                                                                                                                                             SUBJECTIVE STATEMENT: Patient reports he fell in the rain. Is very stiff and doesn't feel good.    Pt accompanied by: self  PERTINENT HISTORY: Patient returning to PT after hospitalization after being off HD for past 3 months with end stage ESRD. PMH DM, HTN, Anemia, Bells palsy, Hypothyroidism, Kidney stones, stroke and pseudotumour cerebri. PAIN:  Are you having pain? No  PRECAUTIONS: Fall  RED FLAGS: None   WEIGHT BEARING RESTRICTIONS: No  FALLS: Has patient fallen in last 6 months? No  PATIENT GOALS: to get LLE stronger and more steady  OBJECTIVE:   TODAY'S TREATMENT:                                                                                                                              DATE: 08/12/23   TherEx: SAQ 15x each LE Posterior pelvic tilt 10x  Adduction ball squeeze 15x Nerve glide 20x each LE  Large swiss ball forward trunk rollout 10x   Manual; Hamstring lengthening 60 seconds Single knee to chest 60 seconds Figure four 60 seconds LE rotation 60 seconds x 3 trials   PATIENT EDUCATION: Education details: goals, POC,  Person educated: Patient Education method: Explanation,  Demonstration, Tactile cues, and Verbal cues Education comprehension: verbalized understanding, returned demonstration, verbal cues required, and tactile cues required  HOME EXERCISE PROGRAM: Access Code: [facility] URL: [email or web address] Date: 06/17/2023 Prepared by: Maureen Ralphs  Exercises - Standing  Balance in Corner with Eyes Closed  - 1 x daily - 2-3 x weekly - 3 sets - up to 30 sec hold - Standing Balance in Corner  - 1 x daily - 2-3 x weekly - 3 sets - 30 sec hold - Corner Balance Feet Apart: Eyes Open With Head Turns  - 1 x daily - 2-3 x weekly - 3 sets - 10 reps - Sit to Stand with Arms Crossed  - 1 x daily - 3 x weekly - 3 sets - 10 reps GOALS: Goals reviewed with patient? Yes  SHORT TERM GOALS: Target date: 06/17/2023   Patient will be independent in home exercise program to improve strength/mobility for better functional independence with ADLs. Baseline: 11/19: compliant  Goal status: MET   LONG TERM GOALS: Target date: 09/09/2023   Patient will increase FOTO score to equal to or greater than   72%  to demonstrate statistically significant improvement in mobility and quality of life.  Baseline: 9/24: 65% 11/19: 60%  Goal status: In progress  2.   Patient (< 30 years old) will complete five times sit to stand test in < 10 seconds indicating an increased LE strength and improved balance. Baseline: 9/24: 20.23 seconds no hands, one near LOB 11/19:15.02 seconds no hands Goal status: Partially Met  3.   Patient will increase Berg Balance score by >45/56 to demonstrate decreased fall risk during functional activities.  Baseline: 39 11/19: 44 Goal status: Partially Met   4.    Patient will increase six minute walk test distance to >1400 for progression to age norm community ambulator and improve gait ability Baseline: 9/24: 1060 11/19: 1070 ft  Goal status: In Progress   5.    Patient will be independent with ascend/descend 12 steps using single UE in step over step pattern without LOB.  Baseline:11/19: sometimes has to use hands and feet like a ladder to get up the stairs  Goal status: INITIAL   ASSESSMENT:  CLINICAL IMPRESSION:  Patient is very fatigued and stiff at beginning of session. Gentle mobility, muscle tissue lengthening, and strengthening tolerated well.  Patient encouraged that there are up and down days in recovery.  Patient will benefit from skilled physical therapy to improve strength, decrease risk of falls, and improve functional mobility.    OBJECTIVE IMPAIRMENTS: Abnormal gait, cardiopulmonary status limiting activity, decreased activity tolerance, decreased balance, decreased coordination, decreased mobility, difficulty walking, decreased strength, impaired flexibility, impaired sensation, impaired vision/preception, improper body mechanics, and postural dysfunction.   ACTIVITY LIMITATIONS: carrying, lifting, bending, sitting, standing, squatting, stairs, transfers, bed mobility, dressing, reach over head, hygiene/grooming, locomotion level, and caring for others  PARTICIPATION LIMITATIONS: meal prep, cleaning, laundry, personal finances, driving, shopping, community activity, occupation, and yard work  PERSONAL FACTORS: Age, Fitness, Past/current experiences, Time since onset of injury/illness/exacerbation, and 3+ comorbidities: DM, HTN, Anemia, Bells palsy, Hypothyroidism, Kidney stones, stroke and pseudotumour cerebri  are also affecting patient's functional outcome.   REHAB POTENTIAL: Good  CLINICAL DECISION MAKING: Evolving/moderate complexity  EVALUATION COMPLEXITY: Moderate  PLAN:  PT FREQUENCY: 2x/week  PT DURATION: 8 weeks  PLANNED INTERVENTIONS: Therapeutic exercises, Therapeutic activity, Neuromuscular re-education, Balance training, Gait training, Patient/Family education, Self Care, Joint mobilization, Stair training, Vestibular training, Canalith repositioning, Visual/preceptual remediation/compensation, Orthotic/Fit training, DME  instructions, Dry Needling, Spinal mobilization, Cryotherapy, Moist heat, Splintting, Taping, Traction, Manual therapy, and Re-evaluation  PLAN FOR NEXT SESSION:  hip flexor strength and ankle strength, balance, resisted multiplanar walking, L LE high level dynamic weight  shifting    Precious Bard, PT 08/12/2023, 9:33 AM  9:33 AM, 08/12/23     9:33 AM, 08/12/23

## 2023-08-12 ENCOUNTER — Ambulatory Visit: Payer: Medicare Other

## 2023-08-12 DIAGNOSIS — M6281 Muscle weakness (generalized): Secondary | ICD-10-CM | POA: Diagnosis not present

## 2023-08-12 DIAGNOSIS — R262 Difficulty in walking, not elsewhere classified: Secondary | ICD-10-CM

## 2023-08-12 DIAGNOSIS — R2681 Unsteadiness on feet: Secondary | ICD-10-CM

## 2023-08-13 ENCOUNTER — Emergency Department: Payer: Medicare Other

## 2023-08-13 ENCOUNTER — Other Ambulatory Visit: Payer: Self-pay

## 2023-08-13 ENCOUNTER — Emergency Department
Admission: EM | Admit: 2023-08-13 | Discharge: 2023-08-14 | Disposition: A | Discharge status: Home / Self Care | Payer: Medicare Other | Attending: Emergency Medicine | Admitting: Emergency Medicine

## 2023-08-13 ENCOUNTER — Ambulatory Visit: Payer: Medicare Other

## 2023-08-13 DIAGNOSIS — M7918 Myalgia, other site: Secondary | ICD-10-CM

## 2023-08-13 DIAGNOSIS — M25511 Pain in right shoulder: Secondary | ICD-10-CM | POA: Diagnosis not present

## 2023-08-13 DIAGNOSIS — S065X0A Traumatic subdural hemorrhage without loss of consciousness, initial encounter: Secondary | ICD-10-CM | POA: Diagnosis not present

## 2023-08-13 DIAGNOSIS — W108XXA Fall (on) (from) other stairs and steps, initial encounter: Secondary | ICD-10-CM | POA: Insufficient documentation

## 2023-08-13 DIAGNOSIS — S0990XA Unspecified injury of head, initial encounter: Secondary | ICD-10-CM | POA: Diagnosis present

## 2023-08-13 DIAGNOSIS — W19XXXA Unspecified fall, initial encounter: Secondary | ICD-10-CM

## 2023-08-13 DIAGNOSIS — S065XAA Traumatic subdural hemorrhage with loss of consciousness status unknown, initial encounter: Secondary | ICD-10-CM

## 2023-08-13 LAB — BASIC METABOLIC PANEL
Anion gap: 10 (ref 5–15)
BUN: 40 mg/dL — ABNORMAL HIGH (ref 6–20)
CO2: 25 mmol/L (ref 22–32)
Calcium: 7 mg/dL — ABNORMAL LOW (ref 8.9–10.3)
Chloride: 104 mmol/L (ref 98–111)
Creatinine, Ser: 6.99 mg/dL — ABNORMAL HIGH (ref 0.61–1.24)
GFR, Estimated: 9 mL/min — ABNORMAL LOW (ref >60)
Glucose, Bld: 160 mg/dL — ABNORMAL HIGH (ref 70–99)
Potassium: 4.2 mmol/L (ref 3.5–5.1)
Sodium: 139 mmol/L (ref 135–145)

## 2023-08-13 LAB — CBC WITH DIFFERENTIAL/PLATELET
Abs Immature Granulocytes: 0.02 10*3/uL (ref 0.00–0.07)
Basophils Absolute: 0 10*3/uL (ref 0.0–0.1)
Basophils Relative: 1 %
Eosinophils Absolute: 0.1 10*3/uL (ref 0.0–0.5)
Eosinophils Relative: 2 %
HCT: 38 % — ABNORMAL LOW (ref 39.0–52.0)
Hemoglobin: 12.2 g/dL — ABNORMAL LOW (ref 13.0–17.0)
Immature Granulocytes: 0 %
Lymphocytes Relative: 12 %
Lymphs Abs: 0.8 10*3/uL (ref 0.7–4.0)
MCH: 30.4 pg (ref 26.0–34.0)
MCHC: 32.1 g/dL (ref 30.0–36.0)
MCV: 94.8 fL (ref 80.0–100.0)
Monocytes Absolute: 0.5 10*3/uL (ref 0.1–1.0)
Monocytes Relative: 8 %
Neutro Abs: 5.1 10*3/uL (ref 1.7–7.7)
Neutrophils Relative %: 77 %
Platelets: 179 10*3/uL (ref 150–400)
RBC: 4.01 MIL/uL — ABNORMAL LOW (ref 4.22–5.81)
RDW: 14.4 % (ref 11.5–15.5)
WBC: 6.6 10*3/uL (ref 4.0–10.5)
nRBC: 0 % (ref 0.0–0.2)

## 2023-08-13 MED ORDER — FENTANYL CITRATE PF 50 MCG/ML IJ SOSY
50.0000 ug | PREFILLED_SYRINGE | Freq: Once | INTRAMUSCULAR | Status: AC
  Administered 2023-08-13: 50 ug via INTRAVENOUS
  Filled 2023-08-13: qty 1

## 2023-08-13 MED ORDER — LEVETIRACETAM 500 MG PO TABS
500.0000 mg | ORAL_TABLET | Freq: Once | ORAL | Status: AC
  Administered 2023-08-14: 500 mg via ORAL
  Filled 2023-08-13: qty 1

## 2023-08-13 NOTE — ED Provider Notes (Signed)
-----------------------------------------   11:31 PM on 08/13/2023 -----------------------------------------  Assuming care from Dr. Derrill Kay.  In short, Gregory Crane is a 52 y.o. male with a chief complaint of fall (history of "balance issues"), takes Eliquis for a-fib.  Refer to the original H&P for additional details.  The current plan of care is to follow up images including head CT.   Clinical Course as of 08/14/23 0533  Wed Aug 13, 2023  2357 Consulted by phone with Dr. Myer Haff with neurosurgery.  He reviewed the imaging and recommended a repeat head CT in 6 hours to see if the subdural is stable.  If so, patient can be discharged on Keppra 500 mg p.o. twice daily x 7 days and Dr. Myer Haff will work on the follow-up plan.  I updated the patient and confirmed the plan as communicated to him by Dr. Derrill Kay.  He is comfortable with the current plan.  I ordered a first dose of Keppra 500 mg p.o. [CF]  Thu Aug 14, 2023  0016 Labs are reassuring [CF]  0522 CT Head Wo Contrast I viewed and interpreted the patient's repeat head CT.  There has been no interval change or progression of the small bleed.  Patient remains neurologically stable.  I updated him about the plan he is comfortable with the plan for discharge and outpatient follow-up. [CF]    Clinical Course User Index [CF] [name], MD     Medications  fentaNYL (SUBLIMAZE) injection 50 mcg (50 mcg Intravenous Given 08/13/23 2313)  levETIRAcetam (KEPPRA) tablet 500 mg (500 mg Oral Given 08/14/23 0111)  morphine (PF) 4 MG/ML injection 4 mg (4 mg Intravenous Given 08/14/23 0151)     ED Discharge Orders          Ordered    levETIRAcetam (KEPPRA) 500 MG tablet  2 times daily        08/14/23 0530           Final diagnoses:  Fall, initial encounter  Musculoskeletal pain  Subdural hematoma (HCC)     [name], MD 08/14/23 [phone]

## 2023-08-13 NOTE — ED Provider Notes (Signed)
[facility] Provider Note    Event Date/Time   First MD Initiated Contact with Patient 08/13/23 2227     (approximate)   History   Head injury  HPI  Gregory Crane is a 52 y.o. male presents to the emergency department today after a fall.  The patient states that he at baseline has difficulty walking up steps.  He is going up some steps with the help of his 42-year-old grandson when he lost his balance and fell.  He did blackout when he hit the back of his head.  He is complaining of some headache as well as right shoulder pain.  He did have 1 episode of vomiting with EMS.  He denies any chest pain or palpitations when this occurred.  He is on Eliquis for atrial fibrillation.     Physical Exam   Triage Vital Signs: ED Triage Vitals  Encounter Vitals Group     BP 08/13/23 2225 (!) 164/67     Systolic BP Percentile --      Diastolic BP Percentile --      Pulse Rate 08/13/23 2225 70     Resp 08/13/23 2225 20     Temp 08/13/23 2225 97.9 F (36.6 C)     Temp Source 08/13/23 2225 Oral     SpO2 08/13/23 2226 96 %     Weight --      Height --      Head Circumference --      Peak Flow --      Pain Score 08/13/23 2221 9     Pain Loc --      Pain Education --      Exclude from Growth Chart --     Most recent vital signs: Vitals:   08/13/23 2225 08/13/23 2226  BP: (!) 164/67   Pulse: 70   Resp: 20   Temp: 97.9 F (36.6 C)   SpO2:  96%   General: Awake, alert, oriented. CV:  Good peripheral perfusion. Regular rate and rhythm. Resp:  Normal effort. Lungs clear. Abd:  No distention.  Other:  Hematoma to scalp. Full ROM of the right shoulder with some tenderness.   ED Results / Procedures / Treatments   Labs (all labs ordered are listed, but only abnormal results are displayed) Labs Reviewed  CBC WITH DIFFERENTIAL/PLATELET  BASIC METABOLIC PANEL     EKG I, Phineas Semen, attending physician, personally viewed and interpreted this  EKG  EKG Time: 2224 Rate: 71 Rhythm: sinus rhythm Axis: normal Intervals: qtc 463 QRS: narrow ST changes: no st elevation Impression: normal ekg    RADIOLOGY Imaging pending at time of sign out  PROCEDURES:  Critical Care performed: No   MEDICATIONS ORDERED IN ED: Medications - No data to display   IMPRESSION / MDM / ASSESSMENT AND PLAN / ED COURSE  I reviewed the triage vital signs and the nursing notes.                              Differential diagnosis includes, but is not limited to, ICH, concussion  Patient's presentation is most consistent with acute presentation with potential threat to life or bodily function.  Patient presented to the emergency department today after a fall and head trauma.  Patient is on Eliquis.  Had 1 episode of vomiting with EMS.  On my exam patient is awake alert and oriented.  Does have hematoma to  his scalp.  Mild tenderness to the right shoulder.  Will get imaging of the head cervical spine and right shoulder.  Will check basic blood work.     FINAL CLINICAL IMPRESSION(S) / ED DIAGNOSES   Fall Head injury   Note:  This document was prepared using Dragon voice recognition software and may include unintentional dictation errors.    Phineas Semen, MD 08/13/23 [phone]

## 2023-08-14 ENCOUNTER — Telehealth: Payer: Self-pay | Admitting: Neurosurgery

## 2023-08-14 ENCOUNTER — Emergency Department: Payer: Medicare Other

## 2023-08-14 ENCOUNTER — Ambulatory Visit: Payer: Medicare Other

## 2023-08-14 DIAGNOSIS — S065XAA Traumatic subdural hemorrhage with loss of consciousness status unknown, initial encounter: Secondary | ICD-10-CM

## 2023-08-14 MED ORDER — LEVETIRACETAM 500 MG PO TABS: 500.0000 mg | ORAL_TABLET | Freq: Two times a day (BID) | ORAL | 0 refills | Status: DC

## 2023-08-14 MED ORDER — MORPHINE SULFATE (PF) 4 MG/ML IV SOLN
4.0000 mg | Freq: Once | INTRAVENOUS | Status: AC
  Administered 2023-08-14: 4 mg via INTRAVENOUS
  Filled 2023-08-14: qty 1

## 2023-08-14 NOTE — Telephone Encounter (Signed)
ED 08/13/2023 due to a fall, small brain bleed. Will need CT scan noncontrast of head and telephone follow up in ~2-4 weeks. Once order is in please let me know so I can notify the patient.

## 2023-08-14 NOTE — Discharge Instructions (Signed)
As we discussed, fortunately although you have a small bleed inside your brain (specifically called a "parafalcine subdural hematoma"), it has been stable for more than 6 hours.  The neurosurgeon feels you are safe to go home.  We recommend you continue taking your regular medications, but please also go to the pharmacy today and take the twice daily course of Keppra that was prescribed.  This should help prevent any seizures that may result from you having a little bit of blood on your brain.  You can follow-up with your regular primary care provider, and the office of Dr. Myer Haff (the neurosurgeon with whom we consulted) will contact you about scheduling a follow-up appointment.    Return to the emergency department if you develop new or worsening symptoms that concern you.

## 2023-08-14 NOTE — ED Triage Notes (Signed)
States he lost balance walking up the steps and fell down 4 steps, striking the back of his head. Daughter states he had positive LOC. Pt is on blood thinners and has a hematoma on the back of his head. Denies CP, or dizziness. States he threw up x 1 after arousing

## 2023-08-14 NOTE — Telephone Encounter (Signed)
 Order for Head CT has been placed. I will send a message to scheduling.

## 2023-08-18 ENCOUNTER — Ambulatory Visit: Payer: Medicare Other

## 2023-08-19 ENCOUNTER — Ambulatory Visit: Payer: Medicare Other

## 2023-08-21 ENCOUNTER — Encounter: Payer: Medicare Other | Admitting: Occupational Therapy

## 2023-08-21 ENCOUNTER — Ambulatory Visit: Payer: Medicare Other

## 2023-08-21 NOTE — Telephone Encounter (Signed)
Left message to call the office.

## 2023-08-25 ENCOUNTER — Ambulatory Visit: Payer: Medicare Other

## 2023-08-25 NOTE — Telephone Encounter (Signed)
Left message to call back  

## 2023-08-26 ENCOUNTER — Ambulatory Visit: Payer: Medicare Other

## 2023-08-28 ENCOUNTER — Ambulatory Visit: Payer: Medicare Other

## 2023-08-29 NOTE — Telephone Encounter (Signed)
 Gregory Crane did call back and said she contacted her brother and he will call the office.

## 2023-08-29 NOTE — Telephone Encounter (Signed)
 Gregory Crane-sister/emergency contact [phone], left message to call back

## 2023-09-01 ENCOUNTER — Ambulatory Visit: Payer: Medicare Other

## 2023-09-02 ENCOUNTER — Ambulatory Visit: Payer: Medicare Other

## 2023-09-03 ENCOUNTER — Ambulatory Visit: Payer: Medicare Other

## 2023-09-04 ENCOUNTER — Ambulatory Visit: Payer: Medicare Other

## 2023-09-05 ENCOUNTER — Ambulatory Visit: Admission: RE | Admit: 2023-09-05 | Payer: Medicare Other | Source: Ambulatory Visit

## 2023-09-05 ENCOUNTER — Ambulatory Visit
Admission: RE | Admit: 2023-09-05 | Discharge: 2023-09-05 | Disposition: A | Discharge status: Home / Self Care | Payer: Medicare Other | Source: Ambulatory Visit | Attending: Neurosurgery | Admitting: Neurosurgery

## 2023-09-05 DIAGNOSIS — S065XAA Traumatic subdural hemorrhage with loss of consciousness status unknown, initial encounter: Secondary | ICD-10-CM | POA: Diagnosis present

## 2023-09-08 ENCOUNTER — Ambulatory Visit: Payer: Medicare Other

## 2023-09-08 NOTE — Telephone Encounter (Signed)
 CT on 09/05/23, left message to call back and schedule office visit to review results.

## 2023-09-09 ENCOUNTER — Ambulatory Visit: Payer: Medicare Other

## 2023-09-10 ENCOUNTER — Ambulatory Visit: Payer: Medicare Other

## 2023-09-10 NOTE — Telephone Encounter (Signed)
 Telephone call 10/02/2023

## 2023-09-11 ENCOUNTER — Ambulatory Visit: Payer: Medicare Other

## 2023-09-15 ENCOUNTER — Ambulatory Visit: Payer: Medicare Other

## 2023-09-16 ENCOUNTER — Ambulatory Visit: Payer: Medicare Other

## 2023-09-17 ENCOUNTER — Ambulatory Visit: Payer: Medicare Other

## 2023-09-18 ENCOUNTER — Ambulatory Visit: Payer: Medicare Other

## 2023-09-22 ENCOUNTER — Other Ambulatory Visit: Payer: Self-pay | Admitting: Internal Medicine

## 2023-09-22 ENCOUNTER — Ambulatory Visit: Payer: Medicare Other

## 2023-09-22 ENCOUNTER — Ambulatory Visit: Payer: Medicare Other | Attending: Internal Medicine

## 2023-09-22 ENCOUNTER — Telehealth: Payer: Self-pay

## 2023-09-22 DIAGNOSIS — R262 Difficulty in walking, not elsewhere classified: Secondary | ICD-10-CM | POA: Insufficient documentation

## 2023-09-22 DIAGNOSIS — R2681 Unsteadiness on feet: Secondary | ICD-10-CM | POA: Insufficient documentation

## 2023-09-22 DIAGNOSIS — R269 Unspecified abnormalities of gait and mobility: Secondary | ICD-10-CM | POA: Insufficient documentation

## 2023-09-22 DIAGNOSIS — R2689 Other abnormalities of gait and mobility: Secondary | ICD-10-CM | POA: Insufficient documentation

## 2023-09-22 DIAGNOSIS — M6281 Muscle weakness (generalized): Secondary | ICD-10-CM | POA: Insufficient documentation

## 2023-09-22 NOTE — Telephone Encounter (Signed)
Order signed
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
## 2023-09-22 NOTE — Telephone Encounter (Signed)
Copied from CRM [id number]. Topic: General - Other >> Sep 22, 2023 11:23 AM Fredrich Romans wrote: Reason for CRM: patient sister would like to know if Dr Lorin Picket received order from his PT office [facility] for an order for a leg brace for left foot and leg? >> Sep 22, 2023 11:27 AM Fredrich Romans wrote: Please call sister regarding leg brace  [phone]
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
## 2023-09-22 NOTE — Telephone Encounter (Unsigned)
Copied from CRM [id number]. Topic: Clinical - Medication Refill >> Sep 22, 2023 11:20 AM Fredrich Romans wrote: Most Recent Primary Care Visit:  Provider: [facility] [place]  Department: [facility]  Visit Type: MYCHART VIDEO VISIT  Date: 08/05/2023  Medication:  pregabalin (LYRICA) 100 MG capsule   Has the patient contacted their pharmacy? No (Agent: If no, request that the patient contact the pharmacy for the refill. If patient does not wish to contact the pharmacy document the reason why and proceed with request.) (Agent: If yes, when and what did the pharmacy advise?)  Is this the correct pharmacy for this prescription? Yes If no, delete pharmacy and type the correct one.  This is the patient's preferred pharmacy:  [id number] [place], [place] - [address] AT [facility] [address] [place] [address] Phone: [phone] Fax: [phone]   Has the prescription been filled recently? No  Is the patient out of the medication? Yes  Has the patient been seen for an appointment in the last year OR does the patient have an upcoming appointment? Yes  Can we respond through MyChart? Yes  Agent: Please be advised that Rx refills may take up to 3 business days. We ask that you follow-up with your pharmacy.

## 2023-09-22 NOTE — Telephone Encounter (Signed)
Order placed out for your signature.

## 2023-09-23 ENCOUNTER — Ambulatory Visit: Payer: Medicare Other

## 2023-09-23 MED ORDER — PREGABALIN 100 MG PO CAPS: 100.0000 mg | ORAL_CAPSULE | Freq: Every day | ORAL | 0 refills | Status: DC
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
## 2023-09-23 NOTE — Telephone Encounter (Signed)
PDMP reviewed. Lyrica refilled #[phone] with no refills. Keep f/u appt.

## 2023-09-23 NOTE — Telephone Encounter (Signed)
Left message to call the office back regarding the message from the Patient on 09/23/23.

## 2023-09-24 ENCOUNTER — Ambulatory Visit: Payer: Medicare Other | Admitting: Physical Therapy

## 2023-09-24 ENCOUNTER — Telehealth: Payer: Self-pay | Admitting: Internal Medicine

## 2023-09-24 ENCOUNTER — Ambulatory Visit: Payer: Medicare Other

## 2023-09-24 DIAGNOSIS — M6281 Muscle weakness (generalized): Secondary | ICD-10-CM | POA: Diagnosis not present

## 2023-09-24 DIAGNOSIS — R2681 Unsteadiness on feet: Secondary | ICD-10-CM

## 2023-09-24 DIAGNOSIS — R2689 Other abnormalities of gait and mobility: Secondary | ICD-10-CM

## 2023-09-24 DIAGNOSIS — R269 Unspecified abnormalities of gait and mobility: Secondary | ICD-10-CM

## 2023-09-24 DIAGNOSIS — R262 Difficulty in walking, not elsewhere classified: Secondary | ICD-10-CM

## 2023-09-24 NOTE — Telephone Encounter (Unsigned)
Copied from CRM [id number]. Topic: General - Other >> Sep 24, 2023 10:36 AM Deaijah H wrote: Reason for CRM: Patient sister Bartolo Darter called in for Brace for left foot, stated they have not received an order ([facility]) to refit patient for brace and can't get another one until a refill. Brace was recommended by PT / please call [phone]
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
## 2023-09-25 ENCOUNTER — Ambulatory Visit: Payer: Medicare Other

## 2023-09-25 NOTE — Telephone Encounter (Signed)
LM for [facility] PT to clarify what is needed.

## 2023-09-26 ENCOUNTER — Telehealth: Payer: Self-pay

## 2023-09-26 NOTE — Telephone Encounter (Signed)
Copied from CRM [id number]. Topic: General - Other >> Sep 26, 2023  8:36 AM Almira Coaster wrote: Reason for CRM: Melissa a PT called because she received a call from [place] and was returning the call. Went over the message that was left but she was not aware. She will follow up. If you need to reach her, her call back number is [phone].

## 2023-09-26 NOTE — Telephone Encounter (Signed)
Spoke with Gregory Crane and she says that we do not need to do anything, I have already faxed order back to them and they will take care of ordering the AFO. They have been communicating with the sister.

## 2023-09-26 NOTE — Telephone Encounter (Signed)
 See other note

## 2023-09-29 ENCOUNTER — Ambulatory Visit: Payer: Medicare Other

## 2023-09-30 ENCOUNTER — Ambulatory Visit: Payer: Medicare Other

## 2023-09-30 NOTE — Therapy (Incomplete)
OUTPATIENT PHYSICAL THERAPY TREATMENT  Patient Name: Gregory Crane MRN: [id number] DOB:Apr 02, 1971, 53 y.o., male Today's Date: 09/30/2023   PCP: Dale [place] MD REFERRING PROVIDER: Dale  MD  END OF SESSION:    Past Medical History:  Diagnosis Date   Allergy    Anemia    Bell's palsy    Diabetes mellitus without complication (HCC)    diet controlled   Hypertension    Hypothyroidism    Kidney stones    Pseudotumor cerebri    Stroke [facility])    Past Surgical History:  Procedure Laterality Date   COLONOSCOPY WITH PROPOFOL N/A 03/30/2020   Procedure: COLONOSCOPY WITH PROPOFOL;  Surgeon: Regis Bill, MD;  Location: [facility] ENDOSCOPY;  Service: Endoscopy;  Laterality: N/A;   LOOP RECORDER INSERTION N/A 01/27/2018   Procedure: LOOP RECORDER INSERTION;  Surgeon: Duke Salvia, MD;  [facility];  Service: Cardiovascular;  Laterality: N/A;   LUMBAR PUNCTURE     as child   NO PAST SURGERIES     TEE WITHOUT CARDIOVERSION N/A 01/07/2018   Procedure: TRANSESOPHAGEAL ECHOCARDIOGRAM (TEE);  Surgeon: Antonieta Iba, MD;  Location: [facility] ORS;  Service: Cardiovascular;  Laterality: N/A;   Patient Active Problem List   Diagnosis Date Noted   Atrial fibrillation (HCC) 08/10/2023   Atrial fibrillation with rapid ventricular response (HCC) 07/03/2023   Atrial fibrillation with RVR (HCC) 07/02/2023   NSTEMI (non-ST elevated myocardial infarction) (HCC) 07/02/2023   Fall 06/15/2023   Non-compliance with renal dialysis (HCC) 05/02/2023   Obesity (BMI 30-39.9) 04/30/2023   Volume overload 04/29/2023   Renal osteodystrophy 11/25/2022   Unsteady gait 10/05/2022   Steal syndrome of dialysis vascular access (HCC) 05/28/2022   Hydronephrosis, left 05/04/2022   Leukocytosis 05/04/2022   Pre-op evaluation 04/24/2022   Chest pain 01/13/2022   Diabetic retinopathy associated with diabetes mellitus due to underlying condition (HCC) 01/13/2022   ESRD on  hemodialysis (HCC) 01/13/2022   Deafness in right ear 08/11/2021   Open wound 01/25/2021   History of colon polyps 10/15/2020   Postoperative hemorrhage involving digestive system following digestive system procedure 09/19/2020   Acute cholecystitis without calculus 09/09/2020   Type 2 diabetes mellitus, with long-term current use of insulin (HCC) 09/09/2020   Cryptogenic stroke (HCC) 07/13/2020   History of loop recorder 07/13/2020   History of 2019 novel coronavirus disease (COVID-19) 05/20/2020   Pneumonia due to COVID-19 virus 04/03/2020   Elevated troponin 04/03/2020   Acquired trigger finger 06/08/2019   Lymphedema 06/08/2019   Anemia 04/17/2019   Swelling of both lower extremities 01/10/2019   Facial droop 04/09/2018   Daytime somnolence 03/30/2018   Carotid artery disease (HCC) 02/03/2018   Intracranial vascular stenosis 09/12/2017   Cough 01/20/2017   Bell's palsy 11/10/2016   History of CVA (cerebrovascular accident) 11/10/2016   Benign localized hyperplasia of prostate with urinary obstruction 10/27/2016   History of nephrolithiasis 10/27/2016   TIA (transient ischemic attack) 10/20/2016   Near syncope 06/23/2016   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  Rationale for Evaluation and Treatment: Rehabilitation  SUBJECTIVE:  SUBJECTIVE STATEMENT:  ***     Pt accompanied by: self  PERTINENT HISTORY: Patient returning to  PT after hospitalization after being off HD for past 3 months with end stage ESRD. PMH DM, HTN, Anemia, Bells palsy, Hypothyroidism, Kidney stones, stroke and pseudotumour cerebri. PAIN:  Are you having pain? No  PRECAUTIONS: Fall  RED FLAGS: None   WEIGHT BEARING RESTRICTIONS: No  FALLS: Has patient fallen in last 6 months? No  PATIENT GOALS: to get LLE stronger and more steady  OBJECTIVE:   TODAY'S TREATMENT: DATE: 09/30/23  TE- To improve strength, endurance, mobility, and function of specific targeted muscle groups or improve joint range of motion or improve muscle flexibility  Nustep level 3 x 6 min  Seated hip adduction into physioball, 3 sec squeeze, 2x10, focus on LLE squeeze force  Seated LAQ with 4# AW donned, 2x10 each LE Seated marches with 4# AW donned, 2x10 each LE LLE leg press 40# with min A on last several reps,maual assistance to prevent knee hyperextension.   TA- To improve functional movements patterns for everyday tasks   Weighted gait with walking stick and with 4# AW x 80 ft x 2 rounds  Ambulation with focus on soft foot contact x 80 ft with no walking aid Step up with UE support x 10 ea LE, poor end range control of LLE  Pt required occasional rest breaks due fatigue, PT was attentive to when pt appeared to be tired or winded in order to prevent excessive fatigue.   PATIENT EDUCATION: Education details: goals, POC,  Person educated: Patient Education method: Explanation, Demonstration, Tactile cues, and Verbal cues Education comprehension: verbalized understanding, returned demonstration, verbal cues required, and tactile cues required  HOME EXERCISE PROGRAM: Access Code: [facility] URL: [email or web address] Date: 06/17/2023 Prepared by: Maureen Ralphs  Exercises - Standing Balance in Corner with Eyes Closed  - 1 x daily - 2-3 x weekly - 3 sets - up to 30 sec hold - Standing Balance in Corner  - 1 x daily - 2-3 x weekly - 3  sets - 30 sec hold - Corner Balance Feet Apart: Eyes Open With Head Turns  - 1 x daily - 2-3 x weekly - 3 sets - 10 reps - Sit to Stand with Arms Crossed  - 1 x daily - 3 x weekly - 3 sets - 10 reps GOALS: Goals reviewed with patient? Yes  SHORT TERM GOALS: Target date: 06/17/2023   Patient will be independent in home exercise program to improve strength/mobility for better functional independence with ADLs. Baseline: 11/19: compliant  Goal status: MET   LONG TERM GOALS: Target date: 09/09/2023   Patient will increase FOTO score to equal to or greater than   72%  to demonstrate statistically significant improvement in mobility and quality of life.  Baseline: 9/24: 65% 11/19: 60%  Goal status: In progress  2.   Patient (< 72 years old) will complete five times sit to stand test in < 10 seconds indicating an increased LE strength and improved balance. Baseline: 9/24: 20.23 seconds no hands, one near LOB  11/19:15.02 seconds no hands 09/22/23: 17.35 secs no hands Goal status: Partially Met  3.   Patient will increase Berg Balance score by >45/56 to demonstrate decreased fall risk during functional activities.  Baseline: 39  11/19: 44 09/19/23:  Goal status: Partially Met   4.    Patient will increase six minute walk test distance to >1400 for progression to age norm community ambulator and  improve gait ability Baseline: 9/24: 1060  11/19: 1070 ft  09/22/23: 964 ft Goal status: In Progress   5.    Patient will be independent with ascend/descend 12 steps using single UE in step over step pattern without LOB.  Baseline:11/19: sometimes has to use hands and feet like a ladder to get up the stairs  09/22/23: Pt utilized R UE for all 12 steps, required BUE's for 2 instances of LOB stepping down on the R LE; assistance provided by therapist to remain upright Goal status: INITIAL   ASSESSMENT:  CLINICAL IMPRESSION: *** Pt will continue to benefit from skilled physical therapy  intervention to address impairments, improve QOL, and attain therapy goals.      OBJECTIVE IMPAIRMENTS: Abnormal gait, cardiopulmonary status limiting activity, decreased activity tolerance, decreased balance, decreased coordination, decreased mobility, difficulty walking, decreased strength, impaired flexibility, impaired sensation, impaired vision/preception, improper body mechanics, and postural dysfunction.   ACTIVITY LIMITATIONS: carrying, lifting, bending, sitting, standing, squatting, stairs, transfers, bed mobility, dressing, reach over head, hygiene/grooming, locomotion level, and caring for others  PARTICIPATION LIMITATIONS: meal prep, cleaning, laundry, personal finances, driving, shopping, community activity, occupation, and yard work  PERSONAL FACTORS: Age, Fitness, Past/current experiences, Time since onset of injury/illness/exacerbation, and 3+ comorbidities: DM, HTN, Anemia, Bells palsy, Hypothyroidism, Kidney stones, stroke and pseudotumour cerebri  are also affecting patient's functional outcome.   REHAB POTENTIAL: Good  CLINICAL DECISION MAKING: Evolving/moderate complexity  EVALUATION COMPLEXITY: Moderate  PLAN:  PT FREQUENCY: 2x/week  PT DURATION: 8 weeks  PLANNED INTERVENTIONS: Therapeutic exercises, Therapeutic activity, Neuromuscular re-education, Balance training, Gait training, Patient/Family education, Self Care, Joint mobilization, Stair training, Vestibular training, Canalith repositioning, Visual/preceptual remediation/compensation, Orthotic/Fit training, DME instructions, Dry Needling, Spinal mobilization, Cryotherapy, Moist heat, Splintting, Taping, Traction, Manual therapy, and Re-evaluation  PLAN FOR NEXT SESSION:  hip flexor strength and ankle strength, balance, resisted multiplanar walking, L LE high level dynamic weight shifting    Precious Bard PT ,DPT Physical Therapist- [facility]  [facility]    09/30/23, 12:57  PM

## 2023-10-01 ENCOUNTER — Ambulatory Visit: Payer: Medicare Other

## 2023-10-02 ENCOUNTER — Ambulatory Visit: Payer: Medicare Other

## 2023-10-02 ENCOUNTER — Ambulatory Visit: Payer: Medicare Other | Admitting: Neurosurgery

## 2023-10-02 DIAGNOSIS — S065XAA Traumatic subdural hemorrhage with loss of consciousness status unknown, initial encounter: Secondary | ICD-10-CM

## 2023-10-02 NOTE — Therapy (Signed)
 OUTPATIENT PHYSICAL THERAPY TREATMENT  Patient Name: Gregory Crane MRN: [id number] DOB:28-Feb-1971, 53 y.o., male Today's Date: 10/06/2023   PCP: Dellar Fenton MD REFERRING PROVIDER: Dellar Fenton MD  END OF SESSION:  PT End of Session - 10/06/23 0929     Visit Number 15    Number of Visits 24    Date for PT Re-Evaluation 11/17/23    Authorization Type Medicare (trad)    Authorization Time Period 09/21/22-11/17/23    Progress Note Due on Visit 20    PT Start Time 0930    PT Stop Time 1014    PT Time Calculation (min) 44 min    Equipment Utilized During Treatment Gait belt    Activity Tolerance Patient tolerated treatment well    Behavior During Therapy WFL for tasks assessed/performed              Past Medical History:  Diagnosis Date   Allergy    Anemia    Bell's palsy    Diabetes mellitus without complication (HCC)    diet controlled   Hypertension    Hypothyroidism    Kidney stones    Pseudotumor cerebri    Stroke [facility])    Past Surgical History:  Procedure Laterality Date   COLONOSCOPY WITH PROPOFOL  N/A 03/30/2020   Procedure: COLONOSCOPY WITH PROPOFOL ;  Surgeon: Shane Darling, MD;  Location: [facility] ENDOSCOPY;  Service: Endoscopy;  Laterality: N/A;   LOOP RECORDER INSERTION N/A 01/27/2018   Procedure: LOOP RECORDER INSERTION;  Surgeon: Verona Goodwill, MD;  Location: [facility] INVASIVE CV LAB;  Service: Cardiovascular;  Laterality: N/A;   LUMBAR PUNCTURE     as child   NO PAST SURGERIES     TEE WITHOUT CARDIOVERSION N/A 01/07/2018   Procedure: TRANSESOPHAGEAL ECHOCARDIOGRAM (TEE);  Surgeon: Devorah Fonder, MD;  Location: [facility] ORS;  Service: Cardiovascular;  Laterality: N/A;   Patient Active Problem List   Diagnosis Date Noted   Atrial fibrillation (HCC) 08/10/2023   Atrial fibrillation with rapid ventricular response (HCC) 07/03/2023   Atrial fibrillation with RVR (HCC) 07/02/2023   NSTEMI (non-ST elevated myocardial infarction) (HCC) 07/02/2023    Fall 06/15/2023   Non-compliance with renal dialysis (HCC) 05/02/2023   Obesity (BMI 30-39.9) 04/30/2023   Volume overload 04/29/2023   Renal osteodystrophy 11/25/2022   Unsteady gait 10/05/2022   Steal syndrome of dialysis vascular access (HCC) 05/28/2022   Hydronephrosis, left 05/04/2022   Leukocytosis 05/04/2022   Pre-op evaluation 04/24/2022   Chest pain 01/13/2022   Diabetic retinopathy associated with diabetes mellitus due to underlying condition (HCC) 01/13/2022   ESRD on hemodialysis (HCC) 01/13/2022   Deafness in right ear 08/11/2021   Open wound 01/25/2021   History of colon polyps 10/15/2020   Postoperative hemorrhage involving digestive system following digestive system procedure 09/19/2020   Acute cholecystitis without calculus 09/09/2020   Type 2 diabetes mellitus, with long-term current use of insulin  (HCC) 09/09/2020   Cryptogenic stroke (HCC) 07/13/2020   History of loop recorder 07/13/2020   History of 2019 novel coronavirus disease (COVID-19) 05/20/2020   Pneumonia due to COVID-19 virus 04/03/2020   Elevated troponin 04/03/2020   Acquired trigger finger 06/08/2019   Lymphedema 06/08/2019   Anemia 04/17/2019   Swelling of both lower extremities 01/10/2019   Facial droop 04/09/2018   Daytime somnolence 03/30/2018   Carotid artery disease (HCC) 02/03/2018   Intracranial vascular stenosis 09/12/2017   Cough 01/20/2017   Bell's palsy 11/10/2016   History of CVA (cerebrovascular accident) 11/10/2016  Benign localized hyperplasia of prostate with urinary obstruction 10/27/2016   History of nephrolithiasis 10/27/2016   TIA (transient ischemic attack) 10/20/2016   Near syncope 06/23/2016   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   Neuropathy 08/06/2015   Health care maintenance 08/06/2015   Essential hypertension 08/06/2015   Heme positive stool 10/10/2013   Hypothyroidism 10/10/2013   Microalbuminuria 10/10/2013   Hyperlipidemia 10/10/2013   B12 deficiency 10/10/2013    Environmental allergies 07/04/2013    ONSET DATE: 3 years ago  REFERRING DIAG: CVA  THERAPY DIAG:  Muscle weakness (generalized)  Unsteadiness on feet  Difficulty in walking, not elsewhere classified  Abnormality of gait and mobility  Rationale for Evaluation and Treatment: Rehabilitation  SUBJECTIVE:                                                                                                                                                                                             SUBJECTIVE STATEMENT:  Patient reports he slept weird,reports his neck is bothering him. Feels a little unsteady.      Pt accompanied by: self  PERTINENT HISTORY: Patient returning to PT after hospitalization after being off HD for past 3 months with end stage ESRD. PMH DM, HTN, Anemia, Bells palsy, Hypothyroidism, Kidney stones, stroke and pseudotumour cerebri. PAIN:  Are you having pain? No  PRECAUTIONS: Fall  RED FLAGS: None   WEIGHT BEARING RESTRICTIONS: No  FALLS: Has patient fallen in last 6 months? No  PATIENT GOALS: to get LLE stronger and more steady  OBJECTIVE:   TODAY'S TREATMENT: DATE: 10/06/23  TE- To improve strength, endurance, mobility, and function of specific targeted muscle groups or improve joint range of motion or improve muscle flexibility  Upper trap stretch 30 seconds x 2 trials each side -reports feeling swimmy headed.  Shoulder rolls 10x   Seated LAQ with 4# AW donned, 2x10 each LE Seated marches with 4# AW donned, 2x10 each LE Seated with #4 AW donned , heel raises 10x; 2 sets  TA- To improve functional movements patterns for everyday tasks  6" step: toe taps 10x each LE; 10x each LE  6" step: step up/down 10x each LE; UE support  6: step; lateral step up/down 10x each LE, UE support more challenging with LLE Weighted gait  with 4# AW x 150 ft x 2 rounds  Ambulation with focus on soft foot contact x 80 ft with no walking aid   Pt required  occasional rest breaks due fatigue, PT was attentive to when pt appeared to be tired or winded in order to prevent excessive fatigue.   PATIENT EDUCATION: Education details: goals, POC,  Person educated: Patient Education method: Explanation, Demonstration, Tactile cues, and Verbal cues Education comprehension: verbalized understanding, returned demonstration, verbal cues required, and tactile cues required  HOME EXERCISE PROGRAM: Access Code: [facility] URL: [email or web address] Date: 06/17/2023 Prepared by: Ferrell Hu  Exercises - Standing Balance in Corner with Eyes Closed  - 1 x daily - 2-3 x weekly - 3 sets - up to 30 sec hold - Standing Balance in Corner  - 1 x daily - 2-3 x weekly - 3 sets - 30 sec hold - Corner Balance Feet Apart: Eyes Open With Head Turns  - 1 x daily - 2-3 x weekly - 3 sets - 10 reps - Sit to Stand with Arms Crossed  - 1 x daily - 3 x weekly - 3 sets - 10 reps GOALS: Goals reviewed with patient? Yes  SHORT TERM GOALS: Target date: 06/17/2023   Patient will be independent in home exercise program to improve strength/mobility for better functional independence with ADLs. Baseline: 11/19: compliant  Goal status: MET   LONG TERM GOALS: Target date: 09/09/2023   Patient will increase FOTO score to equal to or greater than   72%  to demonstrate statistically significant improvement in mobility and quality of life.  Baseline: 9/24: 65% 11/19: 60%  Goal status: In progress  2.   Patient (< 86 years old) will complete five times sit to stand test in < 10 seconds indicating an increased LE strength and improved balance. Baseline: 9/24: 20.23 seconds no hands, one near LOB  11/19:15.02 seconds no hands 09/22/23: 17.35 secs no hands Goal status: Partially Met  3.   Patient will increase Berg Balance score by >45/56 to demonstrate decreased fall risk during functional activities.  Baseline: 39  11/19: 44 09/19/23:  Goal status: Partially  Met   4.    Patient will increase six minute walk test distance to >1400 for progression to age norm community ambulator and improve gait ability Baseline: 9/24: 1060  11/19: 1070 ft  09/22/23: 964 ft Goal status: In Progress   5.    Patient will be independent with ascend/descend 12 steps using single UE in step over step pattern without LOB.  Baseline:11/19: sometimes has to use hands and feet like a ladder to get up the stairs  09/22/23: Pt utilized R UE for all 12 steps, required BUE's for 2 instances of LOB stepping down on the R LE; assistance provided by therapist to remain upright Goal status: INITIAL   ASSESSMENT:  CLINICAL IMPRESSION:  Patient is highly  motivated. He does have intermittent neck pain that is improved with neck stretches and heat pad. He tolerates step negotiation and strengthening this session. LLE is challenging for patient for steps and will continue to benefit from continud strengthening. Pt will continue to benefit from skilled physical therapy intervention to address impairments, improve QOL, and attain therapy goals.      OBJECTIVE IMPAIRMENTS: Abnormal gait, cardiopulmonary status limiting activity, decreased activity tolerance, decreased balance, decreased coordination, decreased mobility, difficulty walking, decreased strength, impaired flexibility, impaired sensation, impaired vision/preception, improper body mechanics, and postural dysfunction.   ACTIVITY LIMITATIONS: carrying, lifting, bending, sitting, standing, squatting, stairs, transfers, bed mobility, dressing, reach over head, hygiene/grooming, locomotion level, and caring for others  PARTICIPATION LIMITATIONS: meal prep, cleaning, laundry, personal finances, driving, shopping, community activity, occupation, and yard work  PERSONAL FACTORS: Age, Fitness, Past/current experiences, Time since onset of injury/illness/exacerbation, and 3+ comorbidities: DM, HTN, Anemia, Bells palsy, Hypothyroidism,  Kidney stones, stroke and pseudotumour cerebri  are also affecting patient's functional outcome.   REHAB POTENTIAL: Good  CLINICAL DECISION MAKING: Evolving/moderate complexity  EVALUATION COMPLEXITY: Moderate  PLAN:  PT FREQUENCY: 2x/week  PT DURATION: 8 weeks  PLANNED INTERVENTIONS: Therapeutic exercises, Therapeutic activity, Neuromuscular re-education, Balance training, Gait training, Patient/Family education, Self Care, Joint mobilization, Stair training, Vestibular training, Canalith repositioning, Visual/preceptual remediation/compensation, Orthotic/Fit training, DME instructions, Dry Needling, Spinal mobilization, Cryotherapy, Moist heat, Splintting, Taping, Traction, Manual therapy, and Re-evaluation  PLAN FOR NEXT SESSION:  hip flexor strength and ankle strength, balance, resisted multiplanar walking, L LE high level dynamic weight shifting    Bambie Pizzolato  Sadie Crank ,DPT Physical Therapist- [place]  [facility]    10/06/23, 10:16 AM

## 2023-10-02 NOTE — Progress Notes (Signed)
 I spoke with Gregory Crane.  He is doing pretty well.  He feels that his cognitive performance is back to near his baseline.  He may have some postconcussive symptoms, but they have improved significantly.  His repeat CT scan shows resolution of his small intracranial hemorrhage.  I suspect he will fully recover.  I will see him back on an as-needed basis.  This visit was performed via telephone.  Patient location: home Provider location: office  I spent a total of 4 minutes non-face-to-face activities for this visit on the date of this encounter including review of current clinical condition and response to treatment.  The patient is aware of and accepts the limits of this telehealth visit.

## 2023-10-06 ENCOUNTER — Ambulatory Visit: Payer: Medicare Other

## 2023-10-06 ENCOUNTER — Ambulatory Visit: Payer: Medicare Other | Attending: Internal Medicine

## 2023-10-06 DIAGNOSIS — R278 Other lack of coordination: Secondary | ICD-10-CM | POA: Diagnosis present

## 2023-10-06 DIAGNOSIS — R269 Unspecified abnormalities of gait and mobility: Secondary | ICD-10-CM | POA: Insufficient documentation

## 2023-10-06 DIAGNOSIS — M6281 Muscle weakness (generalized): Secondary | ICD-10-CM | POA: Diagnosis present

## 2023-10-06 DIAGNOSIS — R2681 Unsteadiness on feet: Secondary | ICD-10-CM | POA: Diagnosis present

## 2023-10-06 DIAGNOSIS — R262 Difficulty in walking, not elsewhere classified: Secondary | ICD-10-CM | POA: Insufficient documentation

## 2023-10-07 ENCOUNTER — Ambulatory Visit: Payer: Medicare Other

## 2023-10-08 ENCOUNTER — Ambulatory Visit: Payer: Medicare Other

## 2023-10-09 ENCOUNTER — Ambulatory Visit: Payer: Medicare Other

## 2023-10-09 ENCOUNTER — Ambulatory Visit (INDEPENDENT_AMBULATORY_CARE_PROVIDER_SITE_OTHER): Payer: Medicare Other | Admitting: Internal Medicine

## 2023-10-09 ENCOUNTER — Encounter: Payer: Self-pay | Admitting: Internal Medicine

## 2023-10-09 VITALS — BP 138/72 | HR 79 | Temp 98.2°F | Ht 72.0 in | Wt 244.8 lb

## 2023-10-09 DIAGNOSIS — E782 Mixed hyperlipidemia: Secondary | ICD-10-CM | POA: Diagnosis not present

## 2023-10-09 DIAGNOSIS — E1122 Type 2 diabetes mellitus with diabetic chronic kidney disease: Secondary | ICD-10-CM | POA: Diagnosis not present

## 2023-10-09 DIAGNOSIS — Z992 Dependence on renal dialysis: Secondary | ICD-10-CM | POA: Diagnosis not present

## 2023-10-09 DIAGNOSIS — N186 End stage renal disease: Secondary | ICD-10-CM

## 2023-10-09 DIAGNOSIS — W19XXXD Unspecified fall, subsequent encounter: Secondary | ICD-10-CM

## 2023-10-09 DIAGNOSIS — M7989 Other specified soft tissue disorders: Secondary | ICD-10-CM

## 2023-10-09 DIAGNOSIS — I10 Essential (primary) hypertension: Secondary | ICD-10-CM

## 2023-10-09 DIAGNOSIS — G629 Polyneuropathy, unspecified: Secondary | ICD-10-CM

## 2023-10-09 DIAGNOSIS — Z794 Long term (current) use of insulin: Secondary | ICD-10-CM

## 2023-10-09 DIAGNOSIS — Z8673 Personal history of transient ischemic attack (TIA), and cerebral infarction without residual deficits: Secondary | ICD-10-CM

## 2023-10-09 DIAGNOSIS — I4891 Unspecified atrial fibrillation: Secondary | ICD-10-CM

## 2023-10-09 DIAGNOSIS — E039 Hypothyroidism, unspecified: Secondary | ICD-10-CM

## 2023-10-09 DIAGNOSIS — D649 Anemia, unspecified: Secondary | ICD-10-CM

## 2023-10-09 DIAGNOSIS — E08319 Diabetes mellitus due to underlying condition with unspecified diabetic retinopathy without macular edema: Secondary | ICD-10-CM

## 2023-10-09 DIAGNOSIS — I779 Disorder of arteries and arterioles, unspecified: Secondary | ICD-10-CM

## 2023-10-09 LAB — GLUCOSE, POCT (MANUAL RESULT ENTRY): POC Glucose: 152 mg/dL — AB (ref 70–99)

## 2023-10-09 MED ORDER — LEVOTHYROXINE SODIUM 175 MCG PO TABS: ORAL_TABLET | ORAL | 0 refills | Status: DC
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
## 2023-10-10 ENCOUNTER — Telehealth: Payer: Self-pay | Admitting: *Deleted

## 2023-10-10 LAB — LIPID PANEL
Cholesterol: 129 mg/dL (ref 0–200)
HDL: 34.1 mg/dL — ABNORMAL LOW (ref >39.00)
LDL Cholesterol: 66 mg/dL (ref 0–99)
NonHDL: 94.89
Total CHOL/HDL Ratio: 4
Triglycerides: 146 mg/dL (ref 0.0–149.0)
VLDL: 29.2 mg/dL (ref 0.0–40.0)

## 2023-10-10 LAB — BASIC METABOLIC PANEL
BUN: 25 mg/dL — ABNORMAL HIGH (ref 6–23)
CO2: 31 meq/L (ref 19–32)
Calcium: 8.6 mg/dL (ref 8.4–10.5)
Chloride: 96 meq/L (ref 96–112)
Creatinine, Ser: 4.56 mg/dL (ref 0.40–1.50)
GFR: 13.94 mL/min — CL (ref >60.00)
Glucose, Bld: 153 mg/dL — ABNORMAL HIGH (ref 70–99)
Potassium: 4.1 meq/L (ref 3.5–5.1)
Sodium: 138 meq/L (ref 135–145)

## 2023-10-10 LAB — HEPATIC FUNCTION PANEL
ALT: 5 U/L (ref 0–53)
AST: 10 U/L (ref 0–37)
Albumin: 4 g/dL (ref 3.5–5.2)
Alkaline Phosphatase: 79 U/L (ref 39–117)
Bilirubin, Direct: 0.1 mg/dL (ref 0.0–0.3)
Total Bilirubin: 0.4 mg/dL (ref 0.2–1.2)
Total Protein: 7.4 g/dL (ref 6.0–8.3)

## 2023-10-10 LAB — CBC WITH DIFFERENTIAL/PLATELET
Basophils Absolute: 0.1 10*3/uL (ref 0.0–0.1)
Basophils Relative: 1.1 % (ref 0.0–3.0)
Eosinophils Absolute: 0.2 10*3/uL (ref 0.0–0.7)
Eosinophils Relative: 3.5 % (ref 0.0–5.0)
HCT: 39.9 % (ref 39.0–52.0)
Hemoglobin: 13.2 g/dL (ref 13.0–17.0)
Lymphocytes Relative: 21.3 % (ref 12.0–46.0)
Lymphs Abs: 1.1 10*3/uL (ref 0.7–4.0)
MCHC: 33.1 g/dL (ref 30.0–36.0)
MCV: 92.4 fL (ref 78.0–100.0)
Monocytes Absolute: 0.5 10*3/uL (ref 0.1–1.0)
Monocytes Relative: 9.5 % (ref 3.0–12.0)
Neutro Abs: 3.3 10*3/uL (ref 1.4–7.7)
Neutrophils Relative %: 64.6 % (ref 43.0–77.0)
Platelets: 228 10*3/uL (ref 150.0–400.0)
RBC: 4.32 Mil/uL (ref 4.22–5.81)
RDW: 14.3 % (ref 11.5–15.5)
WBC: 5.1 10*3/uL (ref 4.0–10.5)

## 2023-10-10 LAB — HEMOGLOBIN A1C: Hgb A1c MFr Bld: 6.4 % (ref 4.6–6.5)

## 2023-10-10 LAB — TSH: TSH: 11.24 u[IU]/mL — ABNORMAL HIGH (ref 0.35–5.50)

## 2023-10-10 NOTE — Telephone Encounter (Signed)
Pt has ESRD. On dialysis.

## 2023-10-10 NOTE — Telephone Encounter (Signed)
CRITICAL VALUE STICKER  CRITICAL VALUE: Creatinine-4.56/GFR-13.94  RECEIVER (on-site recipient of call): Silvestre Moment, CMA  DATE & TIME NOTIFIED: 10/10/23 @ 10:28am  MESSENGER (representative from lab): Saa  MD NOTIFIED:  Dr. Lorin Picket  TIME OF NOTIFICATION: 10:30am  RESPONSE:

## 2023-10-13 ENCOUNTER — Encounter: Payer: Self-pay | Admitting: Podiatry

## 2023-10-13 ENCOUNTER — Encounter: Payer: Self-pay | Admitting: Internal Medicine

## 2023-10-13 ENCOUNTER — Ambulatory Visit (INDEPENDENT_AMBULATORY_CARE_PROVIDER_SITE_OTHER): Payer: Medicare Other | Admitting: Podiatry

## 2023-10-13 ENCOUNTER — Ambulatory Visit: Payer: Medicare Other

## 2023-10-13 VITALS — Ht 72.0 in | Wt 244.8 lb

## 2023-10-13 DIAGNOSIS — E0843 Diabetes mellitus due to underlying condition with diabetic autonomic (poly)neuropathy: Secondary | ICD-10-CM

## 2023-10-13 DIAGNOSIS — M6281 Muscle weakness (generalized): Secondary | ICD-10-CM

## 2023-10-13 DIAGNOSIS — M79674 Pain in right toe(s): Secondary | ICD-10-CM | POA: Diagnosis not present

## 2023-10-13 DIAGNOSIS — M2141 Flat foot [pes planus] (acquired), right foot: Secondary | ICD-10-CM | POA: Diagnosis not present

## 2023-10-13 DIAGNOSIS — E119 Type 2 diabetes mellitus without complications: Secondary | ICD-10-CM

## 2023-10-13 DIAGNOSIS — B351 Tinea unguium: Secondary | ICD-10-CM

## 2023-10-13 DIAGNOSIS — M2142 Flat foot [pes planus] (acquired), left foot: Secondary | ICD-10-CM

## 2023-10-13 DIAGNOSIS — R278 Other lack of coordination: Secondary | ICD-10-CM

## 2023-10-13 DIAGNOSIS — M79675 Pain in left toe(s): Secondary | ICD-10-CM | POA: Diagnosis not present

## 2023-10-13 DIAGNOSIS — R262 Difficulty in walking, not elsewhere classified: Secondary | ICD-10-CM
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
## 2023-10-13 NOTE — Assessment & Plan Note (Signed)
 History of CVA with previous left side weakness. Per review, on eliquis and crestor.  Continue blood pressure control.

## 2023-10-13 NOTE — Assessment & Plan Note (Signed)
 Low carb diet and exercise.  No change in medication.  Seeing endocrinology.  Denies low sugars. Follow met b and a1c.

## 2023-10-13 NOTE — Assessment & Plan Note (Signed)
 Previous afib as outlined. On eliquis and metoprolol.  Follow.

## 2023-10-13 NOTE — Assessment & Plan Note (Signed)
 Sees endocrinology. Low carb diet.  Continue f/u with ophthalmology.

## 2023-10-13 NOTE — Assessment & Plan Note (Signed)
Last carotid ultrasound - <50% bilaterally.  Needs to take statin and keep blood pressure under control.

## 2023-10-13 NOTE — Assessment & Plan Note (Addendum)
 Blood pressure as outlined.  Continue hydralazine and amlodipine.  Also on metoprolol. Follow pressures.  Follow metabolic panel.

## 2023-10-13 NOTE — Assessment & Plan Note (Signed)
Low cholesterol diet and exercise.  Follow lipid panel and liver function tests.  

## 2023-10-13 NOTE — Assessment & Plan Note (Signed)
 Has been on lyrica. Overall stable.

## 2023-10-13 NOTE — Therapy (Signed)
 OUTPATIENT PHYSICAL THERAPY TREATMENT  Patient Name: Barnes Florek MRN: [id number] DOB:06/26/1971, 53 y.o., male Today's Date: 10/13/2023   PCP: Dale [place] MD REFERRING PROVIDER: Dale Green MD  END OF SESSION:  PT End of Session - 10/13/23 0920     Visit Number 16    Number of Visits 24    Date for PT Re-Evaluation 11/17/23    Authorization Type Medicare (trad)    Authorization Time Period 09/21/22-11/17/23    Progress Note Due on Visit 20    PT Start Time 0926    PT Stop Time 1006    PT Time Calculation (min) 40 min    Equipment Utilized During Treatment Gait belt    Activity Tolerance Patient tolerated treatment well;No increased pain    Behavior During Therapy [facility] for tasks assessed/performed              Past Medical History:  Diagnosis Date   Allergy    Anemia    Bell's palsy    Diabetes mellitus without complication (HCC)    diet controlled   Fall 07/2023   Hypertension    Hypothyroidism    Kidney stones    Pseudotumor cerebri    Stroke [facility])    Past Surgical History:  Procedure Laterality Date   COLONOSCOPY WITH PROPOFOL N/A 03/30/2020   Procedure: COLONOSCOPY WITH PROPOFOL;  Surgeon: Regis Bill, MD;  Location: [facility] ENDOSCOPY;  Service: Endoscopy;  Laterality: N/A;   LOOP RECORDER INSERTION N/A 01/27/2018   Procedure: LOOP RECORDER INSERTION;  Surgeon: Duke Salvia, MD;  Location: [facility] INVASIVE CV LAB;  Service: Cardiovascular;  Laterality: N/A;   LUMBAR PUNCTURE     as child   NO PAST SURGERIES     REMOVAL OF A DIALYSIS CATHETER     TEE WITHOUT CARDIOVERSION N/A 01/07/2018   Procedure: TRANSESOPHAGEAL ECHOCARDIOGRAM (TEE);  Surgeon: Antonieta Iba, MD;  Location: [facility] ORS;  Service: Cardiovascular;  Laterality: N/A;   Patient Active Problem List   Diagnosis Date Noted   Atrial fibrillation (HCC) 08/10/2023   Atrial fibrillation with rapid ventricular response (HCC) 07/03/2023   Atrial fibrillation with RVR (HCC)  07/02/2023   NSTEMI (non-ST elevated myocardial infarction) (HCC) 07/02/2023   Fall 06/15/2023   Non-compliance with renal dialysis (HCC) 05/02/2023   Obesity (BMI 30-39.9) 04/30/2023   Volume overload 04/29/2023   Renal osteodystrophy 11/25/2022   Unsteady gait 10/05/2022   Steal syndrome of dialysis vascular access (HCC) 05/28/2022   Hydronephrosis, left 05/04/2022   Leukocytosis 05/04/2022   Pre-op evaluation 04/24/2022   Chest pain 01/13/2022   Diabetic retinopathy associated with diabetes mellitus due to underlying condition (HCC) 01/13/2022   ESRD on hemodialysis (HCC) 01/13/2022   Deafness in right ear 08/11/2021   Open wound 01/25/2021   History of colon polyps 10/15/2020   Postoperative hemorrhage involving digestive system following digestive system procedure 09/19/2020   Acute cholecystitis without calculus 09/09/2020   Type 2 diabetes mellitus, with long-term current use of insulin (HCC) 09/09/2020   Cryptogenic stroke (HCC) 07/13/2020   History of loop recorder 07/13/2020   History of 2019 novel coronavirus disease (COVID-19) 05/20/2020   Pneumonia due to COVID-19 virus 04/03/2020   Elevated troponin 04/03/2020   Acquired trigger finger 06/08/2019   Lymphedema 06/08/2019   Anemia 04/17/2019   Swelling of both lower extremities 01/10/2019   Facial droop 04/09/2018   Daytime somnolence 03/30/2018   Carotid artery disease (HCC) 02/03/2018   Intracranial vascular stenosis 09/12/2017   Cough 01/20/2017  Bell's palsy 11/10/2016   History of CVA (cerebrovascular accident) 11/10/2016   Benign localized hyperplasia of prostate with urinary obstruction 10/27/2016   History of nephrolithiasis 10/27/2016   TIA (transient ischemic attack) 10/20/2016   Near syncope 06/23/2016   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   Neuropathy 08/06/2015   Health care maintenance 08/06/2015   Essential hypertension 08/06/2015   Heme positive stool 10/10/2013   Hypothyroidism 10/10/2013    Microalbuminuria 10/10/2013   Hyperlipidemia 10/10/2013   B12 deficiency 10/10/2013   Environmental allergies 07/04/2013    ONSET DATE: 3 years ago  REFERRING DIAG: CVA  THERAPY DIAG:  Difficulty in walking, not elsewhere classified  Other lack of coordination  Muscle weakness (generalized)  Rationale for Evaluation and Treatment: Rehabilitation  SUBJECTIVE:                                                                                                                                                                                             SUBJECTIVE STATEMENT:  Pt reports he is doing OK. His neck is feeling alright. He reports no stumbles/falls or other pain.    Pt accompanied by: self  PERTINENT HISTORY: Patient returning to PT after hospitalization after being off HD for past 3 months with end stage ESRD. PMH DM, HTN, Anemia, Bells palsy, Hypothyroidism, Kidney stones, stroke and pseudotumour cerebri. PAIN:  Are you having pain? No  PRECAUTIONS: Fall  RED FLAGS: None   WEIGHT BEARING RESTRICTIONS: No  FALLS: Has patient fallen in last 6 months? No  PATIENT GOALS: to get LLE stronger and more steady  OBJECTIVE:   TODAY'S TREATMENT: DATE: 10/13/23  TE- To improve strength, endurance, mobility, and function of specific targeted muscle groups or improve joint range of motion or improve muscle flexibility  Upper trap stretch 30 seconds x 2 trials each side -reports R side feels tighter   Shoulder rolls 10x cw/cc   Seated LAQ with 3# AW donned, 2x12 each LE - rates medium LLE, easy-medium RLE  Seated marches with 3# AW donned, 2x12 each LE -pt reports challenge on LLE  Seated with #3 AW donned , heel raises 15x; 2 sets - pt notes feeling of weakness in LLE Cuing for breathing technique with exercise    No weight- Seated heel raise 10x   TA- To improve functional movements patterns for everyday tasks  Weighted gait  with 3# AW x 150 ft x 2 rounds  - rates  easy  Ambulation with focus on soft foot contact x 150 ft with no weight Additional gait without ankle weights x 5 min focus on endurance and improved gait mechanics, soft foot  contact. Pt notes fatigued in L ankle but otherwise OK   6" step: toe taps 12x each LE; 10x each LE  - rates medium  6" step: step up/down 12x each LE; UE support   6: step; lateral step up/down 10x each LE, UE support more challenging with LLE - rates as a "solid" medium    Pt required occasional rest breaks due fatigue, PT was attentive to when pt appeared to be tired or winded in order to prevent excessive fatigue.   PATIENT EDUCATION: Education Journalist, newspaper  Person educated: Patient Education method: Explanation, Demonstration, Tactile cues, and Verbal cues Education comprehension: verbalized understanding, returned demonstration, verbal cues required, and tactile cues required  HOME EXERCISE PROGRAM: Access Code: [facility] URL: [email or web address] Date: 06/17/2023 Prepared by: Maureen Ralphs  Exercises - Standing Balance in Corner with Eyes Closed  - 1 x daily - 2-3 x weekly - 3 sets - up to 30 sec hold - Standing Balance in Corner  - 1 x daily - 2-3 x weekly - 3 sets - 30 sec hold - Corner Balance Feet Apart: Eyes Open With Head Turns  - 1 x daily - 2-3 x weekly - 3 sets - 10 reps - Sit to Stand with Arms Crossed  - 1 x daily - 3 x weekly - 3 sets - 10 reps GOALS: Goals reviewed with patient? Yes  SHORT TERM GOALS: Target date: 06/17/2023   Patient will be independent in home exercise program to improve strength/mobility for better functional independence with ADLs. Baseline: 11/19: compliant  Goal status: MET   LONG TERM GOALS: Target date: 09/09/2023   Patient will increase FOTO score to equal to or greater than   72%  to demonstrate statistically significant improvement in mobility and quality of life.  Baseline: 9/24: 65% 11/19: 60%  Goal status: In  progress  2.   Patient (< 34 years old) will complete five times sit to stand test in < 10 seconds indicating an increased LE strength and improved balance. Baseline: 9/24: 20.23 seconds no hands, one near LOB  11/19:15.02 seconds no hands 09/22/23: 17.35 secs no hands Goal status: Partially Met  3.   Patient will increase Berg Balance score by >45/56 to demonstrate decreased fall risk during functional activities.  Baseline: 39  11/19: 44 09/19/23:  Goal status: Partially Met   4.    Patient will increase six minute walk test distance to >1400 for progression to age norm community ambulator and improve gait ability Baseline: 9/24: 1060  11/19: 1070 ft  09/22/23: 964 ft Goal status: In Progress   5.    Patient will be independent with ascend/descend 12 steps using single UE in step over step pattern without LOB.  Baseline:11/19: sometimes has to use hands and feet like a ladder to get up the stairs  09/22/23: Pt utilized R UE for all 12 steps, required BUE's for 2 instances of LOB stepping down on the R LE; assistance provided by therapist to remain upright Goal status: INITIAL   ASSESSMENT:  CLINICAL IMPRESSION: Author continued POC as laid out in recent tx sessions. Pt able to progress gait with cuing for soft step LLE, able to sustain improved mechanics for short bouts of time. Overall, gait is fatiguing, but mostly to LLE ankle musculature. No pain with interventions. Pt will continue to benefit from skilled physical therapy intervention to address impairments, improve QOL, and attain therapy goals.      OBJECTIVE IMPAIRMENTS: Abnormal gait, cardiopulmonary status limiting activity,  decreased activity tolerance, decreased balance, decreased coordination, decreased mobility, difficulty walking, decreased strength, impaired flexibility, impaired sensation, impaired vision/preception, improper body mechanics, and postural dysfunction.   ACTIVITY LIMITATIONS: carrying, lifting,  bending, sitting, standing, squatting, stairs, transfers, bed mobility, dressing, reach over head, hygiene/grooming, locomotion level, and caring for others  PARTICIPATION LIMITATIONS: meal prep, cleaning, laundry, personal finances, driving, shopping, community activity, occupation, and yard work  PERSONAL FACTORS: Age, Fitness, Past/current experiences, Time since onset of injury/illness/exacerbation, and 3+ comorbidities: DM, HTN, Anemia, Bells palsy, Hypothyroidism, Kidney stones, stroke and pseudotumour cerebri  are also affecting patient's functional outcome.   REHAB POTENTIAL: Good  CLINICAL DECISION MAKING: Evolving/moderate complexity  EVALUATION COMPLEXITY: Moderate  PLAN:  PT FREQUENCY: 2x/week  PT DURATION: 8 weeks  PLANNED INTERVENTIONS: Therapeutic exercises, Therapeutic activity, Neuromuscular re-education, Balance training, Gait training, Patient/Family education, Self Care, Joint mobilization, Stair training, Vestibular training, Canalith repositioning, Visual/preceptual remediation/compensation, Orthotic/Fit training, DME instructions, Dry Needling, Spinal mobilization, Cryotherapy, Moist heat, Splintting, Taping, Traction, Manual therapy, and Re-evaluation  PLAN FOR NEXT SESSION:  hip flexor strength and ankle strength, balance, resisted multiplanar walking, L LE high level dynamic weight shifting    Baird Kay PT ,DPT Physical Therapist- [facility]  [facility]    10/13/23, 10:11 AM

## 2023-10-13 NOTE — Assessment & Plan Note (Signed)
 On thyroid replacement.  Follow tsh.

## 2023-10-13 NOTE — Assessment & Plan Note (Signed)
 Has seen [facility] - swelling was improved overall.  Avoid increased sodium intake.  Elevate legs.

## 2023-10-13 NOTE — Assessment & Plan Note (Signed)
 Continue hemodialysis.  Followed by nephrology. Request labs today.

## 2023-10-14 ENCOUNTER — Ambulatory Visit: Payer: Medicare Other

## 2023-10-15 ENCOUNTER — Encounter: Payer: Medicare Other | Admitting: Internal Medicine

## 2023-10-15 ENCOUNTER — Ambulatory Visit: Payer: Medicare Other

## 2023-10-16 ENCOUNTER — Ambulatory Visit: Payer: Medicare Other

## 2023-10-16 NOTE — Therapy (Signed)
 OUTPATIENT PHYSICAL THERAPY TREATMENT  Patient Name: Gregory Crane MRN: [id number] DOB:1971/08/15, 53 y.o., male Today's Date: 10/20/2023   PCP: Dale Manito MD REFERRING PROVIDER: Dale [place] MD  END OF SESSION:  PT End of Session - 10/20/23 0927     Visit Number 17    Number of Visits 24    Date for PT Re-Evaluation 11/17/23    Authorization Type Medicare (trad)    Authorization Time Period 09/21/22-11/17/23    Progress Note Due on Visit 20    PT Start Time 0928    PT Stop Time 1014    PT Time Calculation (min) 46 min    Equipment Utilized During Treatment Gait belt    Activity Tolerance Patient tolerated treatment well;No increased pain    Behavior During Therapy [facility] for tasks assessed/performed               Past Medical History:  Diagnosis Date   Allergy    Anemia    Bell's palsy    Diabetes mellitus without complication (HCC)    diet controlled   Fall 07/2023   Hypertension    Hypothyroidism    Kidney stones    Pseudotumor cerebri    Stroke [facility])    Past Surgical History:  Procedure Laterality Date   COLONOSCOPY WITH PROPOFOL N/A 03/30/2020   Procedure: COLONOSCOPY WITH PROPOFOL;  Surgeon: Regis Bill, MD;  Location: [facility] ENDOSCOPY;  Service: Endoscopy;  Laterality: N/A;   LOOP RECORDER INSERTION N/A 01/27/2018   Procedure: LOOP RECORDER INSERTION;  Surgeon: Duke Salvia, MD;  Location: [facility] INVASIVE CV LAB;  Service: Cardiovascular;  Laterality: N/A;   LUMBAR PUNCTURE     as child   NO PAST SURGERIES     REMOVAL OF A DIALYSIS CATHETER     TEE WITHOUT CARDIOVERSION N/A 01/07/2018   Procedure: TRANSESOPHAGEAL ECHOCARDIOGRAM (TEE);  Surgeon: Antonieta Iba, MD;  Location: [facility] ORS;  Service: Cardiovascular;  Laterality: N/A;   Patient Active Problem List   Diagnosis Date Noted   Atrial fibrillation (HCC) 08/10/2023   Atrial fibrillation with rapid ventricular response (HCC) 07/03/2023   Atrial fibrillation with RVR (HCC)  07/02/2023   NSTEMI (non-ST elevated myocardial infarction) (HCC) 07/02/2023   Fall 06/15/2023   Non-compliance with renal dialysis (HCC) 05/02/2023   Obesity (BMI 30-39.9) 04/30/2023   Volume overload 04/29/2023   Renal osteodystrophy 11/25/2022   Unsteady gait 10/05/2022   Steal syndrome of dialysis vascular access (HCC) 05/28/2022   Hydronephrosis, left 05/04/2022   Leukocytosis 05/04/2022   Pre-op evaluation 04/24/2022   Chest pain 01/13/2022   Diabetic retinopathy associated with diabetes mellitus due to underlying condition (HCC) 01/13/2022   ESRD on hemodialysis (HCC) 01/13/2022   Deafness in right ear 08/11/2021   Open wound 01/25/2021   History of colon polyps 10/15/2020   Postoperative hemorrhage involving digestive system following digestive system procedure 09/19/2020   Acute cholecystitis without calculus 09/09/2020   Type 2 diabetes mellitus, with long-term current use of insulin (HCC) 09/09/2020   Cryptogenic stroke (HCC) 07/13/2020   History of loop recorder 07/13/2020   History of 2019 novel coronavirus disease (COVID-19) 05/20/2020   Pneumonia due to COVID-19 virus 04/03/2020   Elevated troponin 04/03/2020   Acquired trigger finger 06/08/2019   Lymphedema 06/08/2019   Anemia 04/17/2019   Swelling of both lower extremities 01/10/2019   Facial droop 04/09/2018   Daytime somnolence 03/30/2018   Carotid artery disease (HCC) 02/03/2018   Intracranial vascular stenosis 09/12/2017   Cough  01/20/2017   Bell's palsy 11/10/2016   History of CVA (cerebrovascular accident) 11/10/2016   Benign localized hyperplasia of prostate with urinary obstruction 10/27/2016   History of nephrolithiasis 10/27/2016   TIA (transient ischemic attack) 10/20/2016   Near syncope 06/23/2016   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   Neuropathy 08/06/2015   Health care maintenance 08/06/2015   Essential hypertension 08/06/2015   Heme positive stool 10/10/2013   Hypothyroidism 10/10/2013    Microalbuminuria 10/10/2013   Hyperlipidemia 10/10/2013   B12 deficiency 10/10/2013   Environmental allergies 07/04/2013    ONSET DATE: 3 years ago  REFERRING DIAG: CVA  THERAPY DIAG:  Difficulty in walking, not elsewhere classified  Muscle weakness (generalized)  Unsteadiness on feet  Rationale for Evaluation and Treatment: Rehabilitation  SUBJECTIVE:                                                                                                                                                                                             SUBJECTIVE STATEMENT:  Patient reports feeling very unstable today, didn't sleep well. Reports his BP and blood sugar is good.   Pt accompanied by: self  PERTINENT HISTORY: Patient returning to PT after hospitalization after being off HD for past 3 months with end stage ESRD. PMH DM, HTN, Anemia, Bells palsy, Hypothyroidism, Kidney stones, stroke and pseudotumour cerebri. PAIN:  Are you having pain? No  PRECAUTIONS: Fall  RED FLAGS: None   WEIGHT BEARING RESTRICTIONS: No  FALLS: Has patient fallen in last 6 months? No  PATIENT GOALS: to get LLE stronger and more steady  OBJECTIVE:   TODAY'S TREATMENT: DATE: 10/20/23  TE- To improve strength, endurance, mobility, and function of specific targeted muscle groups or improve joint range of motion or improve muscle flexibility Slider: -ER/IR 15x each LE; 2 sets -hamstring curl 15x; 2 sets  GTB PF 20x  March 10x each LE Df 10x each LE    TA- To improve functional movements patterns for everyday tasks  Nustep Lvl 3; seat position 10; 5 minutes; increased resistance to 4 after 2 minutes rwise OK  10x STS focus on foot position   Neuro Re-ed: Standing with CGA next to support surface:  Airex pad: static stand 30 seconds x 2 trials, noticeable trembling of ankles/LE's with fatigue and challenge to maintain stability Airex pad: horizontal head turns 30 seconds scanning room 10x ;  cueing for arc of motion  Airex pad: vertical head turns 30 seconds, cueing for arc of motion, noticeable sway with upward gaze increasing demand on ankle righting reaction musculature Airex pad: march 10x each LE   Pt required occasional rest breaks  due fatigue, PT was attentive to when pt appeared to be tired or winded in order to prevent excessive fatigue.   PATIENT EDUCATION: Education Journalist, newspaper  Person educated: Patient Education method: Explanation, Demonstration, Tactile cues, and Verbal cues Education comprehension: verbalized understanding, returned demonstration, verbal cues required, and tactile cues required  HOME EXERCISE PROGRAM: Access Code: [facility] URL: [email or web address] Date: 06/17/2023 Prepared by: Maureen Ralphs  Exercises - Standing Balance in Corner with Eyes Closed  - 1 x daily - 2-3 x weekly - 3 sets - up to 30 sec hold - Standing Balance in Corner  - 1 x daily - 2-3 x weekly - 3 sets - 30 sec hold - Corner Balance Feet Apart: Eyes Open With Head Turns  - 1 x daily - 2-3 x weekly - 3 sets - 10 reps - Sit to Stand with Arms Crossed  - 1 x daily - 3 x weekly - 3 sets - 10 reps GOALS: Goals reviewed with patient? Yes  SHORT TERM GOALS: Target date: 06/17/2023   Patient will be independent in home exercise program to improve strength/mobility for better functional independence with ADLs. Baseline: 11/19: compliant  Goal status: MET   LONG TERM GOALS: Target date: 09/09/2023   Patient will increase FOTO score to equal to or greater than   72%  to demonstrate statistically significant improvement in mobility and quality of life.  Baseline: 9/24: 65% 11/19: 60%  Goal status: In progress  2.   Patient (< 43 years old) will complete five times sit to stand test in < 10 seconds indicating an increased LE strength and improved balance. Baseline: 9/24: 20.23 seconds no hands, one near LOB  11/19:15.02 seconds no  hands 09/22/23: 17.35 secs no hands Goal status: Partially Met  3.   Patient will increase Berg Balance score by >45/56 to demonstrate decreased fall risk during functional activities.  Baseline: 39  11/19: 44 09/19/23:  Goal status: Partially Met   4.    Patient will increase six minute walk test distance to >1400 for progression to age norm community ambulator and improve gait ability Baseline: 9/24: 1060  11/19: 1070 ft  09/22/23: 964 ft Goal status: In Progress   5.    Patient will be independent with ascend/descend 12 steps using single UE in step over step pattern without LOB.  Baseline:11/19: sometimes has to use hands and feet like a ladder to get up the stairs  09/22/23: Pt utilized R UE for all 12 steps, required BUE's for 2 instances of LOB stepping down on the R LE; assistance provided by therapist to remain upright Goal status: INITIAL   ASSESSMENT:  CLINICAL IMPRESSION: Patient has increased "wooziness" in head today as well as increased L foot inversion and collapse. His vitals remain WFL. Patient is highly motivated for functional mobility. L ankle strength continues to be an area of focus.  Pt will continue to benefit from skilled physical therapy intervention to address impairments, improve QOL, and attain therapy goals.      OBJECTIVE IMPAIRMENTS: Abnormal gait, cardiopulmonary status limiting activity, decreased activity tolerance, decreased balance, decreased coordination, decreased mobility, difficulty walking, decreased strength, impaired flexibility, impaired sensation, impaired vision/preception, improper body mechanics, and postural dysfunction.   ACTIVITY LIMITATIONS: carrying, lifting, bending, sitting, standing, squatting, stairs, transfers, bed mobility, dressing, reach over head, hygiene/grooming, locomotion level, and caring for others  PARTICIPATION LIMITATIONS: meal prep, cleaning, laundry, personal finances, driving, shopping, community activity,  occupation, and yard work  PERSONAL FACTORS: Age, Fitness,  Past/current experiences, Time since onset of injury/illness/exacerbation, and 3+ comorbidities: DM, HTN, Anemia, Bells palsy, Hypothyroidism, Kidney stones, stroke and pseudotumour cerebri  are also affecting patient's functional outcome.   REHAB POTENTIAL: Good  CLINICAL DECISION MAKING: Evolving/moderate complexity  EVALUATION COMPLEXITY: Moderate  PLAN:  PT FREQUENCY: 2x/week  PT DURATION: 8 weeks  PLANNED INTERVENTIONS: Therapeutic exercises, Therapeutic activity, Neuromuscular re-education, Balance training, Gait training, Patient/Family education, Self Care, Joint mobilization, Stair training, Vestibular training, Canalith repositioning, Visual/preceptual remediation/compensation, Orthotic/Fit training, DME instructions, Dry Needling, Spinal mobilization, Cryotherapy, Moist heat, Splintting, Taping, Traction, Manual therapy, and Re-evaluation  PLAN FOR NEXT SESSION:  hip flexor strength and ankle strength, balance, resisted multiplanar walking, L LE high level dynamic weight shifting    Precious Bard PT ,DPT Physical Therapist- [facility]  [facility]    10/20/23, 10:14 AM

## 2023-10-19 NOTE — Progress Notes (Signed)
 ANNUAL DIABETIC FOOT EXAM  Subjective: Gregory Crane presents today for annual diabetic foot exam.  Chief Complaint  Patient presents with   Nail Problem    Pt is here for [facility] last A1C was 6.1 PCP is Dr Gregory Crane and LOV was last week.   Patient confirms h/o diabetes.  Patient denies any h/o foot wounds.  Patient has been diagnosed with neuropathy.  Gregory Center, MD is patient's PCP.  Past Medical History:  Diagnosis Date   Allergy    Anemia    Bell's palsy    Diabetes mellitus without complication (HCC)    diet controlled   Fall 07/2023   Hypertension    Hypothyroidism    Kidney stones    Pseudotumor cerebri    Stroke [facility])    Patient Active Problem List   Diagnosis Date Noted   Atrial fibrillation (HCC) 08/10/2023   Atrial fibrillation with rapid ventricular response (HCC) 07/03/2023   Atrial fibrillation with RVR (HCC) 07/02/2023   NSTEMI (non-ST elevated myocardial infarction) (HCC) 07/02/2023   Fall 06/15/2023   Non-compliance with renal dialysis (HCC) 05/02/2023   Obesity (BMI 30-39.9) 04/30/2023   Volume overload 04/29/2023   Renal osteodystrophy 11/25/2022   Unsteady gait 10/05/2022   Steal syndrome of dialysis vascular access (HCC) 05/28/2022   Hydronephrosis, left 05/04/2022   Leukocytosis 05/04/2022   Pre-op evaluation 04/24/2022   Chest pain 01/13/2022   Diabetic retinopathy associated with diabetes mellitus due to underlying condition (HCC) 01/13/2022   ESRD on hemodialysis (HCC) 01/13/2022   Deafness in right ear 08/11/2021   Open wound 01/25/2021   History of colon polyps 10/15/2020   Postoperative hemorrhage involving digestive system following digestive system procedure 09/19/2020   Acute cholecystitis without calculus 09/09/2020   Type 2 diabetes mellitus, with long-term current use of insulin (HCC) 09/09/2020   Cryptogenic stroke (HCC) 07/13/2020   History of loop recorder 07/13/2020   History of 2019 novel coronavirus disease  (COVID-19) 05/20/2020   Pneumonia due to COVID-19 virus 04/03/2020   Elevated troponin 04/03/2020   Acquired trigger finger 06/08/2019   Lymphedema 06/08/2019   Anemia 04/17/2019   Swelling of both lower extremities 01/10/2019   Facial droop 04/09/2018   Daytime somnolence 03/30/2018   Carotid artery disease (HCC) 02/03/2018   Intracranial vascular stenosis 09/12/2017   Cough 01/20/2017   Bell's palsy 11/10/2016   History of CVA (cerebrovascular accident) 11/10/2016   Benign localized hyperplasia of prostate with urinary obstruction 10/27/2016   History of nephrolithiasis 10/27/2016   TIA (transient ischemic attack) 10/20/2016   Near syncope 06/23/2016   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   Neuropathy 08/06/2015   Health care maintenance 08/06/2015   Essential hypertension 08/06/2015   Heme positive stool 10/10/2013   Hypothyroidism 10/10/2013   Microalbuminuria 10/10/2013   Hyperlipidemia 10/10/2013   B12 deficiency 10/10/2013   Environmental allergies 07/04/2013   Past Surgical History:  Procedure Laterality Date   COLONOSCOPY WITH PROPOFOL N/A 03/30/2020   Procedure: COLONOSCOPY WITH PROPOFOL;  Surgeon: Regis Bill, MD;  Location: [facility] ENDOSCOPY;  Service: Endoscopy;  Laterality: N/A;   LOOP RECORDER INSERTION N/A 01/27/2018   Procedure: LOOP RECORDER INSERTION;  Surgeon: Duke Salvia, MD;  Location: [facility] INVASIVE CV LAB;  Service: Cardiovascular;  Laterality: N/A;   LUMBAR PUNCTURE     as child   NO PAST SURGERIES     REMOVAL OF A DIALYSIS CATHETER     TEE WITHOUT CARDIOVERSION N/A 01/07/2018   Procedure: TRANSESOPHAGEAL ECHOCARDIOGRAM (TEE);  Surgeon:  Antonieta Iba, MD;  Location: [facility] ORS;  Service: Cardiovascular;  Laterality: N/A;   Current Outpatient Medications on File Prior to Visit  Medication Sig Dispense Refill   amLODipine (NORVASC) 10 MG tablet Take 0.5 tablets (5 mg total) by mouth daily. Reduced from 10 mg.     apixaban (ELIQUIS) 5 MG TABS  tablet Take 1 tablet (5 mg total) by mouth 2 (two) times daily. 60 tablet 2   furosemide (LASIX) 40 MG tablet Take 1 tablet (40 mg total) by mouth 2 (two) times daily. 60 tablet 2   hydrALAZINE (APRESOLINE) 50 MG tablet Take 0.5 tablets (25 mg total) by mouth 2 (two) times daily. Home med.     levothyroxine (SYNTHROID) 175 MCG tablet TAKE 1 TABLET (175 MCG) BY MOUTH ONCE DAILY BEFORE BREAKFAST. 90 tablet 0   metoprolol tartrate (LOPRESSOR) 25 MG tablet Take 1 tablet (25 mg total) by mouth 2 (two) times daily. 60 tablet 2   pregabalin (LYRICA) 100 MG capsule Take 1 capsule (100 mg total) by mouth daily. 30 capsule 0   RENVELA 800 MG tablet Take 800 mg by mouth 3 (three) times daily.     rosuvastatin (CRESTOR) 5 MG tablet Take 5 mg by mouth daily.     levETIRAcetam (KEPPRA) 500 MG tablet Take 1 tablet (500 mg total) by mouth 2 (two) times daily for 7 days. 14 tablet 0   No current facility-administered medications on file prior to visit.    No Known Allergies Social History   Occupational History   Occupation: disability  Tobacco Use   Smoking status: Never   Smokeless tobacco: Never  Vaping Use   Vaping status: Never Used  Substance and Sexual Activity   Alcohol use: No    Comment: last use early 2023   Drug use: No   Sexual activity: Yes   Family History  Problem Relation Age of Onset   Breast cancer Mother    Diabetes Father    Diabetes Sister    Arthritis Maternal Grandmother    Diabetes Maternal Grandmother    Immunization History  Administered Date(s) Administered   Hepb-cpg 04/02/2022, 05/07/2022, 06/01/2022, 08/03/2022   Influenza Split 05/10/2014   Influenza, Seasonal, Injecte, Preservative Fre 06/09/2023   Influenza,inj,Quad PF,6+ Mos 06/21/2019, 05/16/2020, 06/26/2021   Influenza-Unspecified 07/02/2015, 05/30/2017, 05/26/2018, 05/26/2022   Pneumococcal Conjugate-13 05/18/2022     Review of Systems: Negative except as noted in the HPI.   Objective: There were  no vitals filed for this visit.  Gregory Crane is a pleasant 53 y.o. male in NAD. AAO X 3.  Diabetic foot exam was performed with the following findings:   Vascular Examination: CFT <3 seconds b/l. DP pulses faintly palpable b/l. PT pulses nonpalpable b/l. Digital hair absent. Skin temperature gradient warm to warm b/l. No pain with calf compression. No ischemia or gangrene. No cyanosis or clubbing noted b/l. +Edema left ankle. Trace edema right ankle  Neurological Examination: Pt has subjective symptoms of neuropathy. Protective sensation diminished with 10g monofilament b/l. Proprioception intact bilaterally.  Dermatological Examination: Pedal skin warm and supple b/l. No open wounds b/l. No interdigital macerations. Toenails 1-5 b/l thick, discolored, elongated with subungual debris and pain on dorsal palpation.  No hyperkeratotic nor porokeratotic lesions present on today's visit.  Musculoskeletal Examination: Muscle strength 5/5 to all lower extremity muscle groups bilaterally. Pes planus deformity noted bilateral LE.  Radiographs: None     Lab Results  Component Value Date   HGBA1C 6.4 10/09/2023  ADA Risk Categorization: High Risk  Patient has one or more of the following: Loss of protective sensation Absent pedal pulses Severe Foot deformity History of foot ulcer  Assessment: 1. Pain due to onychomycosis of toenails of both feet   2. Pes planus of both feet   3. Diabetes mellitus due to underlying condition with diabetic autonomic neuropathy, unspecified whether long term insulin use (HCC)   4. Encounter for diabetic foot exam [facility])     Plan: Patient was evaluated and treated. All patient's and/or POA's questions/concerns addressed on today's visit. Mycotic toenails 1-5 debrided in length and girth without incident.  Continue daily foot inspections and monitor blood glucose per PCP/Endocrinologist's recommendations.Continue soft, supportive shoe gear daily. Report any  pedal injuries to medical professional. Call office if there are any quesitons/concerns. -Patient/POA to call should there be question/concern in the interim. Return in about 3 months (around 01/10/2024).  [name], DPM      [employer] LOCATION: [address], [place] [address]                   Office [phone]   [facility] LOCATION: [address] [place], [place] [address] Office [phone]

## 2023-10-20 ENCOUNTER — Ambulatory Visit: Payer: Medicare Other

## 2023-10-20 DIAGNOSIS — M6281 Muscle weakness (generalized): Secondary | ICD-10-CM

## 2023-10-20 DIAGNOSIS — R2681 Unsteadiness on feet: Secondary | ICD-10-CM

## 2023-10-20 DIAGNOSIS — R262 Difficulty in walking, not elsewhere classified: Secondary | ICD-10-CM

## 2023-10-21 ENCOUNTER — Ambulatory Visit: Payer: Medicare Other

## 2023-10-21 NOTE — Therapy (Signed)
 OUTPATIENT PHYSICAL THERAPY TREATMENT  Patient Name: Gregory Crane MRN: [id number] DOB:Nov 30, 1970, 53 y.o., male Today's Date: 10/22/2023   PCP: Dale Blawnox MD REFERRING PROVIDER: Dale Stockton MD  END OF SESSION:  PT End of Session - 10/22/23 1141     Visit Number 18    Number of Visits 24    Date for PT Re-Evaluation 11/17/23    Authorization Type Medicare (trad)    Authorization Time Period 09/21/22-11/17/23    Progress Note Due on Visit 20    PT Start Time 1143    PT Stop Time 1228    PT Time Calculation (min) 45 min    Equipment Utilized During Treatment Gait belt    Activity Tolerance Patient tolerated treatment well;No increased pain    Behavior During Therapy [facility] for tasks assessed/performed                Past Medical History:  Diagnosis Date   Allergy    Anemia    Bell's palsy    Diabetes mellitus without complication (HCC)    diet controlled   Fall 07/2023   Hypertension    Hypothyroidism    Kidney stones    Pseudotumor cerebri    Stroke [facility])    Past Surgical History:  Procedure Laterality Date   COLONOSCOPY WITH PROPOFOL N/A 03/30/2020   Procedure: COLONOSCOPY WITH PROPOFOL;  Surgeon: Regis Bill, MD;  Location: [facility] ENDOSCOPY;  Service: Endoscopy;  Laterality: N/A;   LOOP RECORDER INSERTION N/A 01/27/2018   Procedure: LOOP RECORDER INSERTION;  Surgeon: Duke Salvia, MD;  Location: [facility] INVASIVE CV LAB;  Service: Cardiovascular;  Laterality: N/A;   LUMBAR PUNCTURE     as child   NO PAST SURGERIES     REMOVAL OF A DIALYSIS CATHETER     TEE WITHOUT CARDIOVERSION N/A 01/07/2018   Procedure: TRANSESOPHAGEAL ECHOCARDIOGRAM (TEE);  Surgeon: Antonieta Iba, MD;  Location: [facility] ORS;  Service: Cardiovascular;  Laterality: N/A;   Patient Active Problem List   Diagnosis Date Noted   Atrial fibrillation (HCC) 08/10/2023   Atrial fibrillation with rapid ventricular response (HCC) 07/03/2023   Atrial fibrillation with RVR (HCC)  07/02/2023   NSTEMI (non-ST elevated myocardial infarction) (HCC) 07/02/2023   Fall 06/15/2023   Non-compliance with renal dialysis (HCC) 05/02/2023   Obesity (BMI 30-39.9) 04/30/2023   Volume overload 04/29/2023   Renal osteodystrophy 11/25/2022   Unsteady gait 10/05/2022   Steal syndrome of dialysis vascular access (HCC) 05/28/2022   Hydronephrosis, left 05/04/2022   Leukocytosis 05/04/2022   Pre-op evaluation 04/24/2022   Chest pain 01/13/2022   Diabetic retinopathy associated with diabetes mellitus due to underlying condition (HCC) 01/13/2022   ESRD on hemodialysis (HCC) 01/13/2022   Deafness in right ear 08/11/2021   Open wound 01/25/2021   History of colon polyps 10/15/2020   Postoperative hemorrhage involving digestive system following digestive system procedure 09/19/2020   Acute cholecystitis without calculus 09/09/2020   Type 2 diabetes mellitus, with long-term current use of insulin (HCC) 09/09/2020   Cryptogenic stroke (HCC) 07/13/2020   History of loop recorder 07/13/2020   History of 2019 novel coronavirus disease (COVID-19) 05/20/2020   Pneumonia due to COVID-19 virus 04/03/2020   Elevated troponin 04/03/2020   Acquired trigger finger 06/08/2019   Lymphedema 06/08/2019   Anemia 04/17/2019   Swelling of both lower extremities 01/10/2019   Facial droop 04/09/2018   Daytime somnolence 03/30/2018   Carotid artery disease (HCC) 02/03/2018   Intracranial vascular stenosis 09/12/2017  Cough 01/20/2017   Bell's palsy 11/10/2016   History of CVA (cerebrovascular accident) 11/10/2016   Benign localized hyperplasia of prostate with urinary obstruction 10/27/2016   History of nephrolithiasis 10/27/2016   TIA (transient ischemic attack) 10/20/2016   Near syncope 06/23/2016   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   Neuropathy 08/06/2015   Health care maintenance 08/06/2015   Essential hypertension 08/06/2015   Heme positive stool 10/10/2013   Hypothyroidism 10/10/2013    Microalbuminuria 10/10/2013   Hyperlipidemia 10/10/2013   B12 deficiency 10/10/2013   Environmental allergies 07/04/2013    ONSET DATE: 3 years ago  REFERRING DIAG: CVA  THERAPY DIAG:  Difficulty in walking, not elsewhere classified  Muscle weakness (generalized)  Unsteadiness on feet  Rationale for Evaluation and Treatment: Rehabilitation  SUBJECTIVE:                                                                                                                                                                                             SUBJECTIVE STATEMENT:  Patients reports feeling better than Monday. Is getting fit for his new orthotic later this month.    Pt accompanied by: self  PERTINENT HISTORY: Patient returning to PT after hospitalization after being off HD for past 3 months with end stage ESRD. PMH DM, HTN, Anemia, Bells palsy, Hypothyroidism, Kidney stones, stroke and pseudotumour cerebri. PAIN:  Are you having pain? No  PRECAUTIONS: Fall  RED FLAGS: None   WEIGHT BEARING RESTRICTIONS: No  FALLS: Has patient fallen in last 6 months? No  PATIENT GOALS: to get LLE stronger and more steady  OBJECTIVE:   TODAY'S TREATMENT: DATE: 10/22/23  TE- To improve strength, endurance, mobility, and function of specific targeted muscle groups or improve joint range of motion or improve muscle flexibility Prostretch: df/pf with focus on neutral alignment of LLE Towel scrunch 8x; 2 sets L toes -inversion to neutral alignment of LLE on towel x multiple attempts, progressed to pushing big toe into mat x multiple attempts  Adduction ball squeeze 15x Adduction squeeze with pf 15x Laq with adduction between feet 15x     Neuro Re-ed: Standing with CGA next to support surface:  Airex pad: static stand 30 seconds x 2 trials, noticeable trembling of ankles/LE's with fatigue and challenge to maintain stability Airex pad: horizontal head turns 30 seconds scanning room 10x ;  cueing for arc of motion  Airex pad: vertical head turns 30 seconds, cueing for arc of motion, noticeable sway with upward gaze increasing demand on ankle righting reaction musculature Airex pad: march 10x each LE Airex pad: dual task sort letters by colors   Pt required occasional  rest breaks due fatigue, PT was attentive to when pt appeared to be tired or winded in order to prevent excessive fatigue.   PATIENT EDUCATION: Education Journalist, newspaper  Person educated: Patient Education method: Explanation, Demonstration, Tactile cues, and Verbal cues Education comprehension: verbalized understanding, returned demonstration, verbal cues required, and tactile cues required  HOME EXERCISE PROGRAM: Access Code: [facility] URL: [email or web address] Date: 06/17/2023 Prepared by: Maureen Ralphs  Exercises - Standing Balance in Corner with Eyes Closed  - 1 x daily - 2-3 x weekly - 3 sets - up to 30 sec hold - Standing Balance in Corner  - 1 x daily - 2-3 x weekly - 3 sets - 30 sec hold - Corner Balance Feet Apart: Eyes Open With Head Turns  - 1 x daily - 2-3 x weekly - 3 sets - 10 reps - Sit to Stand with Arms Crossed  - 1 x daily - 3 x weekly - 3 sets - 10 reps GOALS: Goals reviewed with patient? Yes  SHORT TERM GOALS: Target date: 06/17/2023   Patient will be independent in home exercise program to improve strength/mobility for better functional independence with ADLs. Baseline: 11/19: compliant  Goal status: MET   LONG TERM GOALS: Target date: 09/09/2023   Patient will increase FOTO score to equal to or greater than   72%  to demonstrate statistically significant improvement in mobility and quality of life.  Baseline: 9/24: 65% 11/19: 60%  Goal status: In progress  2.   Patient (< 65 years old) will complete five times sit to stand test in < 10 seconds indicating an increased LE strength and improved balance. Baseline: 9/24: 20.23 seconds no hands, one  near LOB  11/19:15.02 seconds no hands 09/22/23: 17.35 secs no hands Goal status: Partially Met  3.   Patient will increase Berg Balance score by >45/56 to demonstrate decreased fall risk during functional activities.  Baseline: 39  11/19: 44 09/19/23:  Goal status: Partially Met   4.    Patient will increase six minute walk test distance to >1400 for progression to age norm community ambulator and improve gait ability Baseline: 9/24: 1060  11/19: 1070 ft  09/22/23: 964 ft Goal status: In Progress   5.    Patient will be independent with ascend/descend 12 steps using single UE in step over step pattern without LOB.  Baseline:11/19: sometimes has to use hands and feet like a ladder to get up the stairs  09/22/23: Pt utilized R UE for all 12 steps, required BUE's for 2 instances of LOB stepping down on the R LE; assistance provided by therapist to remain upright Goal status: INITIAL   ASSESSMENT:  CLINICAL IMPRESSION: Patient presents with excellent motivation. He is able to create neutral alignment with decreased need for external cues. He is very challenged with foot intrinsic activation. Pt will continue to benefit from skilled physical therapy intervention to address impairments, improve QOL, and attain therapy goals.      OBJECTIVE IMPAIRMENTS: Abnormal gait, cardiopulmonary status limiting activity, decreased activity tolerance, decreased balance, decreased coordination, decreased mobility, difficulty walking, decreased strength, impaired flexibility, impaired sensation, impaired vision/preception, improper body mechanics, and postural dysfunction.   ACTIVITY LIMITATIONS: carrying, lifting, bending, sitting, standing, squatting, stairs, transfers, bed mobility, dressing, reach over head, hygiene/grooming, locomotion level, and caring for others  PARTICIPATION LIMITATIONS: meal prep, cleaning, laundry, personal finances, driving, shopping, community activity, occupation, and yard  work  PERSONAL FACTORS: Age, Fitness, Past/current experiences, Time since onset of injury/illness/exacerbation, and 3+ comorbidities:  DM, HTN, Anemia, Bells palsy, Hypothyroidism, Kidney stones, stroke and pseudotumour cerebri  are also affecting patient's functional outcome.   REHAB POTENTIAL: Good  CLINICAL DECISION MAKING: Evolving/moderate complexity  EVALUATION COMPLEXITY: Moderate  PLAN:  PT FREQUENCY: 2x/week  PT DURATION: 8 weeks  PLANNED INTERVENTIONS: Therapeutic exercises, Therapeutic activity, Neuromuscular re-education, Balance training, Gait training, Patient/Family education, Self Care, Joint mobilization, Stair training, Vestibular training, Canalith repositioning, Visual/preceptual remediation/compensation, Orthotic/Fit training, DME instructions, Dry Needling, Spinal mobilization, Cryotherapy, Moist heat, Splintting, Taping, Traction, Manual therapy, and Re-evaluation  PLAN FOR NEXT SESSION:  hip flexor strength and ankle strength, balance, resisted multiplanar walking, L LE high level dynamic weight shifting    Precious Bard PT ,DPT Physical Therapist- [facility]  [facility]    10/22/23, 12:36 PM

## 2023-10-22 ENCOUNTER — Ambulatory Visit: Payer: Medicare Other

## 2023-10-22 DIAGNOSIS — M6281 Muscle weakness (generalized): Secondary | ICD-10-CM

## 2023-10-22 DIAGNOSIS — R2681 Unsteadiness on feet: Secondary | ICD-10-CM

## 2023-10-22 DIAGNOSIS — R262 Difficulty in walking, not elsewhere classified: Secondary | ICD-10-CM

## 2023-10-23 ENCOUNTER — Ambulatory Visit: Payer: Medicare Other

## 2023-10-27 ENCOUNTER — Ambulatory Visit: Payer: Medicare Other

## 2023-10-28 ENCOUNTER — Ambulatory Visit: Payer: Medicare Other

## 2023-10-29 ENCOUNTER — Ambulatory Visit: Payer: Medicare Other

## 2023-10-30 ENCOUNTER — Ambulatory Visit: Payer: Medicare Other

## 2023-10-30 NOTE — Therapy (Signed)
 OUTPATIENT PHYSICAL THERAPY TREATMENT  Patient Name: Gregory Crane MRN: [id number] DOB:19-Feb-1971, 53 y.o., male Today's Date: 11/03/2023   PCP: Dale Comanche MD REFERRING PROVIDER: Dale Dunmore MD  END OF SESSION:  PT End of Session - 11/03/23 1043     Visit Number 19    Number of Visits 24    Date for PT Re-Evaluation 11/17/23    Authorization Type Medicare (trad)    Authorization Time Period 09/21/22-11/17/23    Progress Note Due on Visit 20    PT Start Time 1100    PT Stop Time 1144    PT Time Calculation (min) 44 min    Equipment Utilized During Treatment Gait belt    Activity Tolerance Patient tolerated treatment well;No increased pain    Behavior During Therapy [facility] for tasks assessed/performed                 Past Medical History:  Diagnosis Date   Allergy    Anemia    Bell's palsy    Diabetes mellitus without complication (HCC)    diet controlled   Fall 07/2023   Hypertension    Hypothyroidism    Kidney stones    Pseudotumor cerebri    Stroke [facility])    Past Surgical History:  Procedure Laterality Date   COLONOSCOPY WITH PROPOFOL N/A 03/30/2020   Procedure: COLONOSCOPY WITH PROPOFOL;  Surgeon: Regis Bill, MD;  Location: [facility] ENDOSCOPY;  Service: Endoscopy;  Laterality: N/A;   LOOP RECORDER INSERTION N/A 01/27/2018   Procedure: LOOP RECORDER INSERTION;  Surgeon: Duke Salvia, MD;  Location: [facility] INVASIVE CV LAB;  Service: Cardiovascular;  Laterality: N/A;   LUMBAR PUNCTURE     as child   NO PAST SURGERIES     REMOVAL OF A DIALYSIS CATHETER     TEE WITHOUT CARDIOVERSION N/A 01/07/2018   Procedure: TRANSESOPHAGEAL ECHOCARDIOGRAM (TEE);  Surgeon: Antonieta Iba, MD;  Location: [facility] ORS;  Service: Cardiovascular;  Laterality: N/A;   Patient Active Problem List   Diagnosis Date Noted   Atrial fibrillation (HCC) 08/10/2023   Atrial fibrillation with rapid ventricular response (HCC) 07/03/2023   Atrial fibrillation with RVR (HCC)  07/02/2023   NSTEMI (non-ST elevated myocardial infarction) (HCC) 07/02/2023   Fall 06/15/2023   Non-compliance with renal dialysis (HCC) 05/02/2023   Obesity (BMI 30-39.9) 04/30/2023   Volume overload 04/29/2023   Renal osteodystrophy 11/25/2022   Unsteady gait 10/05/2022   Steal syndrome of dialysis vascular access (HCC) 05/28/2022   Hydronephrosis, left 05/04/2022   Leukocytosis 05/04/2022   Pre-op evaluation 04/24/2022   Chest pain 01/13/2022   Diabetic retinopathy associated with diabetes mellitus due to underlying condition (HCC) 01/13/2022   ESRD on hemodialysis (HCC) 01/13/2022   Deafness in right ear 08/11/2021   Open wound 01/25/2021   History of colon polyps 10/15/2020   Postoperative hemorrhage involving digestive system following digestive system procedure 09/19/2020   Acute cholecystitis without calculus 09/09/2020   Type 2 diabetes mellitus, with long-term current use of insulin (HCC) 09/09/2020   Cryptogenic stroke (HCC) 07/13/2020   History of loop recorder 07/13/2020   History of 2019 novel coronavirus disease (COVID-19) 05/20/2020   Pneumonia due to COVID-19 virus 04/03/2020   Elevated troponin 04/03/2020   Acquired trigger finger 06/08/2019   Lymphedema 06/08/2019   Anemia 04/17/2019   Swelling of both lower extremities 01/10/2019   Facial droop 04/09/2018   Daytime somnolence 03/30/2018   Carotid artery disease (HCC) 02/03/2018   Intracranial vascular stenosis 09/12/2017  Cough 01/20/2017   Bell's palsy 11/10/2016   History of CVA (cerebrovascular accident) 11/10/2016   Benign localized hyperplasia of prostate with urinary obstruction 10/27/2016   History of nephrolithiasis 10/27/2016   TIA (transient ischemic attack) 10/20/2016   Near syncope 06/23/2016   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   Neuropathy 08/06/2015   Health care maintenance 08/06/2015   Essential hypertension 08/06/2015   Heme positive stool 10/10/2013   Hypothyroidism 10/10/2013    Microalbuminuria 10/10/2013   Hyperlipidemia 10/10/2013   B12 deficiency 10/10/2013   Environmental allergies 07/04/2013    ONSET DATE: 3 years ago  REFERRING DIAG: CVA  THERAPY DIAG:  Difficulty in walking, not elsewhere classified  Muscle weakness (generalized)  Unsteadiness on feet  Rationale for Evaluation and Treatment: Rehabilitation  SUBJECTIVE:                                                                                                                                                                                             SUBJECTIVE STATEMENT:  Patient reports no falls. Goes to the orthotist Wednesday after his appt.     Pt accompanied by: self  PERTINENT HISTORY: Patient returning to PT after hospitalization after being off HD for past 3 months with end stage ESRD. PMH DM, HTN, Anemia, Bells palsy, Hypothyroidism, Kidney stones, stroke and pseudotumour cerebri. PAIN:  Are you having pain? No  PRECAUTIONS: Fall  RED FLAGS: None   WEIGHT BEARING RESTRICTIONS: No  FALLS: Has patient fallen in last 6 months? No  PATIENT GOALS: to get LLE stronger and more steady  OBJECTIVE:   TODAY'S TREATMENT: DATE: 11/03/23  TA- To improve functional movements patterns for everyday tasks   Nustep seat position 10; intervals between level 2 and level 5; 6 minutes  6" step: -step up/down 10x each LE -lateral step up/down 10x each LE   Heel raise standing 10x ; 2 sets   Slider: -hamstring curl 10x -eversion/inverison 10x      Neuro Re-ed: Standing with CGA next to support surface:  Airex pad: static stand 30 seconds x 2 trials, noticeable trembling of ankles/LE's with fatigue and challenge to maintain stability Airex pad: horizontal head turns 30 seconds scanning room 10x ; cueing for arc of motion  Airex pad: vertical head turns 30 seconds, cueing for arc of motion, noticeable sway with upward gaze increasing demand on ankle righting reaction  musculature Airex pad: 6" step toe taps 10x Airex pad; tandem stance 30 seconds each LE ; 2 sets    Pt required occasional rest breaks due fatigue, PT was attentive to when pt appeared to be tired or winded in order  to prevent excessive fatigue.   PATIENT EDUCATION: Education Journalist, newspaper  Person educated: Patient Education method: Explanation, Demonstration, Tactile cues, and Verbal cues Education comprehension: verbalized understanding, returned demonstration, verbal cues required, and tactile cues required  HOME EXERCISE PROGRAM: Access Code: [facility] URL: [email or web address] Date: 06/17/2023 Prepared by: Maureen Ralphs  Exercises - Standing Balance in Corner with Eyes Closed  - 1 x daily - 2-3 x weekly - 3 sets - up to 30 sec hold - Standing Balance in Corner  - 1 x daily - 2-3 x weekly - 3 sets - 30 sec hold - Corner Balance Feet Apart: Eyes Open With Head Turns  - 1 x daily - 2-3 x weekly - 3 sets - 10 reps - Sit to Stand with Arms Crossed  - 1 x daily - 3 x weekly - 3 sets - 10 reps GOALS: Goals reviewed with patient? Yes  SHORT TERM GOALS: Target date: 06/17/2023   Patient will be independent in home exercise program to improve strength/mobility for better functional independence with ADLs. Baseline: 11/19: compliant  Goal status: MET   LONG TERM GOALS: Target date: 09/09/2023   Patient will increase FOTO score to equal to or greater than   72%  to demonstrate statistically significant improvement in mobility and quality of life.  Baseline: 9/24: 65% 11/19: 60%  Goal status: In progress  2.   Patient (< 51 years old) will complete five times sit to stand test in < 10 seconds indicating an increased LE strength and improved balance. Baseline: 9/24: 20.23 seconds no hands, one near LOB  11/19:15.02 seconds no hands 09/22/23: 17.35 secs no hands Goal status: Partially Met  3.   Patient will increase Berg Balance score by >45/56 to  demonstrate decreased fall risk during functional activities.  Baseline: 39  11/19: 44 09/19/23:  Goal status: Partially Met   4.    Patient will increase six minute walk test distance to >1400 for progression to age norm community ambulator and improve gait ability Baseline: 9/24: 1060  11/19: 1070 ft  09/22/23: 964 ft Goal status: In Progress   5.    Patient will be independent with ascend/descend 12 steps using single UE in step over step pattern without LOB.  Baseline:11/19: sometimes has to use hands and feet like a ladder to get up the stairs  09/22/23: Pt utilized R UE for all 12 steps, required BUE's for 2 instances of LOB stepping down on the R LE; assistance provided by therapist to remain upright Goal status: INITIAL   ASSESSMENT:  CLINICAL IMPRESSION: Patient is more challenged with LLE negotiation of steps than RLE due to fatigue and excessive eversion/collapse of ankle. He is highly motivated throughout session. Unstable surfaces continue to challenge patient at this time. Pt will continue to benefit from skilled physical therapy intervention to address impairments, improve QOL, and attain therapy goals.      OBJECTIVE IMPAIRMENTS: Abnormal gait, cardiopulmonary status limiting activity, decreased activity tolerance, decreased balance, decreased coordination, decreased mobility, difficulty walking, decreased strength, impaired flexibility, impaired sensation, impaired vision/preception, improper body mechanics, and postural dysfunction.   ACTIVITY LIMITATIONS: carrying, lifting, bending, sitting, standing, squatting, stairs, transfers, bed mobility, dressing, reach over head, hygiene/grooming, locomotion level, and caring for others  PARTICIPATION LIMITATIONS: meal prep, cleaning, laundry, personal finances, driving, shopping, community activity, occupation, and yard work  PERSONAL FACTORS: Age, Fitness, Past/current experiences, Time since onset of  injury/illness/exacerbation, and 3+ comorbidities: DM, HTN, Anemia, Bells palsy, Hypothyroidism, Kidney stones, stroke and  pseudotumour cerebri  are also affecting patient's functional outcome.   REHAB POTENTIAL: Good  CLINICAL DECISION MAKING: Evolving/moderate complexity  EVALUATION COMPLEXITY: Moderate  PLAN:  PT FREQUENCY: 2x/week  PT DURATION: 8 weeks  PLANNED INTERVENTIONS: Therapeutic exercises, Therapeutic activity, Neuromuscular re-education, Balance training, Gait training, Patient/Family education, Self Care, Joint mobilization, Stair training, Vestibular training, Canalith repositioning, Visual/preceptual remediation/compensation, Orthotic/Fit training, DME instructions, Dry Needling, Spinal mobilization, Cryotherapy, Moist heat, Splintting, Taping, Traction, Manual therapy, and Re-evaluation  PLAN FOR NEXT SESSION:  hip flexor strength and ankle strength, balance, resisted multiplanar walking, L LE high level dynamic weight shifting    Precious Bard PT ,DPT Physical Therapist- [facility]  [facility]    11/03/23, 11:44 AM

## 2023-11-01 ENCOUNTER — Other Ambulatory Visit: Payer: Self-pay | Admitting: Internal Medicine

## 2023-11-03 ENCOUNTER — Ambulatory Visit: Payer: Medicare Other | Attending: Internal Medicine

## 2023-11-03 ENCOUNTER — Ambulatory Visit: Payer: Medicare Other

## 2023-11-03 DIAGNOSIS — R262 Difficulty in walking, not elsewhere classified: Secondary | ICD-10-CM | POA: Insufficient documentation

## 2023-11-03 DIAGNOSIS — M6281 Muscle weakness (generalized): Secondary | ICD-10-CM | POA: Insufficient documentation

## 2023-11-03 DIAGNOSIS — R269 Unspecified abnormalities of gait and mobility: Secondary | ICD-10-CM | POA: Insufficient documentation

## 2023-11-03 DIAGNOSIS — R2681 Unsteadiness on feet: Secondary | ICD-10-CM | POA: Insufficient documentation

## 2023-11-04 ENCOUNTER — Ambulatory Visit: Payer: Medicare Other

## 2023-11-05 ENCOUNTER — Ambulatory Visit: Payer: Medicare Other

## 2023-11-06 ENCOUNTER — Ambulatory Visit: Payer: Medicare Other

## 2023-11-06 NOTE — Therapy (Signed)
 OUTPATIENT PHYSICAL THERAPY TREATMENT/ Physical Therapy Progress Note   Dates of reporting period  07/29/23   to   11/10/23    Patient Name: Gregory Crane MRN: [id number] DOB:12-15-1970, 53 y.o., male Today's Date: 11/10/2023   PCP: Dale [place]-Leto MD REFERRING PROVIDER: Dale Buffalo Soapstone MD  END OF SESSION:  PT End of Session - 11/10/23 1106     Visit Number 20    Number of Visits 24    Date for PT Re-Evaluation 11/17/23    Authorization Type Medicare (trad)    Authorization Time Period 09/21/22-11/17/23    Progress Note Due on Visit 20    PT Start Time 1100    PT Stop Time 1144    PT Time Calculation (min) 44 min    Equipment Utilized During Treatment Gait belt    Activity Tolerance Patient tolerated treatment well;No increased pain    Behavior During Therapy [facility] for tasks assessed/performed                  Past Medical History:  Diagnosis Date   Allergy    Anemia    Bell's palsy    Diabetes mellitus without complication (HCC)    diet controlled   Fall 07/2023   Hypertension    Hypothyroidism    Kidney stones    Pseudotumor cerebri    Stroke [facility])    Past Surgical History:  Procedure Laterality Date   COLONOSCOPY WITH PROPOFOL N/A 03/30/2020   Procedure: COLONOSCOPY WITH PROPOFOL;  Surgeon: Regis Bill, MD;  Location: [facility] ENDOSCOPY;  Service: Endoscopy;  Laterality: N/A;   LOOP RECORDER INSERTION N/A 01/27/2018   Procedure: LOOP RECORDER INSERTION;  Surgeon: Duke Salvia, MD;  Location: [facility] INVASIVE CV LAB;  Service: Cardiovascular;  Laterality: N/A;   LUMBAR PUNCTURE     as child   NO PAST SURGERIES     REMOVAL OF A DIALYSIS CATHETER     TEE WITHOUT CARDIOVERSION N/A 01/07/2018   Procedure: TRANSESOPHAGEAL ECHOCARDIOGRAM (TEE);  Surgeon: Antonieta Iba, MD;  Location: [facility] ORS;  Service: Cardiovascular;  Laterality: N/A;   Patient Active Problem List   Diagnosis Date Noted   Atrial fibrillation (HCC) 08/10/2023   Atrial  fibrillation with rapid ventricular response (HCC) 07/03/2023   Atrial fibrillation with RVR (HCC) 07/02/2023   NSTEMI (non-ST elevated myocardial infarction) (HCC) 07/02/2023   Fall 06/15/2023   Non-compliance with renal dialysis (HCC) 05/02/2023   Obesity (BMI 30-39.9) 04/30/2023   Volume overload 04/29/2023   Renal osteodystrophy 11/25/2022   Unsteady gait 10/05/2022   Steal syndrome of dialysis vascular access (HCC) 05/28/2022   Hydronephrosis, left 05/04/2022   Leukocytosis 05/04/2022   Pre-op evaluation 04/24/2022   Chest pain 01/13/2022   Diabetic retinopathy associated with diabetes mellitus due to underlying condition (HCC) 01/13/2022   ESRD on hemodialysis (HCC) 01/13/2022   Deafness in right ear 08/11/2021   Open wound 01/25/2021   History of colon polyps 10/15/2020   Postoperative hemorrhage involving digestive system following digestive system procedure 09/19/2020   Acute cholecystitis without calculus 09/09/2020   Type 2 diabetes mellitus, with long-term current use of insulin (HCC) 09/09/2020   Cryptogenic stroke (HCC) 07/13/2020   History of loop recorder 07/13/2020   History of 2019 novel coronavirus disease (COVID-19) 05/20/2020   Pneumonia due to COVID-19 virus 04/03/2020   Elevated troponin 04/03/2020   Acquired trigger finger 06/08/2019   Lymphedema 06/08/2019   Anemia 04/17/2019   Swelling of both lower extremities 01/10/2019   Facial  droop 04/09/2018   Daytime somnolence 03/30/2018   Carotid artery disease (HCC) 02/03/2018   Intracranial vascular stenosis 09/12/2017   Cough 01/20/2017   Bell's palsy 11/10/2016   History of CVA (cerebrovascular accident) 11/10/2016   Benign localized hyperplasia of prostate with urinary obstruction 10/27/2016   History of nephrolithiasis 10/27/2016   TIA (transient ischemic attack) 10/20/2016   Near syncope 06/23/2016   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   Neuropathy 08/06/2015   Health care maintenance 08/06/2015    Essential hypertension 08/06/2015   Heme positive stool 10/10/2013   Hypothyroidism 10/10/2013   Microalbuminuria 10/10/2013   Hyperlipidemia 10/10/2013   B12 deficiency 10/10/2013   Environmental allergies 07/04/2013    ONSET DATE: 3 years ago  REFERRING DIAG: CVA  THERAPY DIAG:  Difficulty in walking, not elsewhere classified  Muscle weakness (generalized)  Unsteadiness on feet  Rationale for Evaluation and Treatment: Rehabilitation  SUBJECTIVE:                                                                                                                                                                                             SUBJECTIVE STATEMENT:  Patient accidentally wearing two different shoes and has R shoe on L foot. Normal R shoe on R foot. Is feeling very unstable   Pt accompanied by: self  PERTINENT HISTORY: Patient returning to PT after hospitalization after being off HD for past 3 months with end stage ESRD. PMH DM, HTN, Anemia, Bells palsy, Hypothyroidism, Kidney stones, stroke and pseudotumour cerebri. PAIN:  Are you having pain? No  PRECAUTIONS: Fall  RED FLAGS: None   WEIGHT BEARING RESTRICTIONS: No  FALLS: Has patient fallen in last 6 months? No  PATIENT GOALS: to get LLE stronger and more steady  OBJECTIVE:   TODAY'S TREATMENT: DATE: 11/10/23    TE- To improve strength, endurance, mobility, and function of specific targeted muscle groups or improve joint range of motion or improve muscle flexibility   Nustep seat position 10; intervals between level 2 and level 5; 6 minutes Seated; 3lb AW: -marching 15x each LE; 2 sets -LAQ 15x each LE; 2 sets  -heel raise 15x ; 2 sets  -adduction with ER 15x; each side; 2 sets  Adduction balls squeeze 15x; 2 sets BTB hamstring curl 15x each : 2 sets  BTB df 15x each LE      Pt required occasional rest breaks due fatigue, PT was attentive to when pt appeared to be tired or winded in order to  prevent excessive fatigue.   PATIENT EDUCATION: Education details:exercise technique  Person educated: Patient Education method: Explanation, Demonstration, Tactile cues, and Verbal cues  Education comprehension: verbalized understanding, returned demonstration, verbal cues required, and tactile cues required  HOME EXERCISE PROGRAM: Access Code: [facility] URL: [email or web address] Date: 06/17/2023 Prepared by: Maureen Ralphs  Exercises - Standing Balance in Corner with Eyes Closed  - 1 x daily - 2-3 x weekly - 3 sets - up to 30 sec hold - Standing Balance in Corner  - 1 x daily - 2-3 x weekly - 3 sets - 30 sec hold - Corner Balance Feet Apart: Eyes Open With Head Turns  - 1 x daily - 2-3 x weekly - 3 sets - 10 reps - Sit to Stand with Arms Crossed  - 1 x daily - 3 x weekly - 3 sets - 10 reps GOALS: Goals reviewed with patient? Yes  SHORT TERM GOALS: Target date: 06/17/2023   Patient will be independent in home exercise program to improve strength/mobility for better functional independence with ADLs. Baseline: 11/19: compliant  Goal status: MET   LONG TERM GOALS: Target date: 11/17/23   Patient will increase FOTO score to equal to or greater than   72%  to demonstrate statistically significant improvement in mobility and quality of life.  Baseline: 9/24: 65% 11/19: 60%  Goal status: deferred  2.   Patient (< 23 years old) will complete five times sit to stand test in < 10 seconds indicating an increased LE strength and improved balance. Baseline: 9/24: 20.23 seconds no hands, one near LOB  11/19:15.02 seconds no hands 09/22/23: 17.35 secs no hands Goal status: Partially Met  3.   Patient will increase Berg Balance score by >45/56 to demonstrate decreased fall risk during functional activities.  Baseline: 39  11/19: 44 09/19/23:  Goal status: Partially Met   4.    Patient will increase six minute walk test distance to >1400 for progression to age norm  community ambulator and improve gait ability Baseline: 9/24: 1060  11/19: 1070 ft  09/22/23: 964 ft Goal status: In Progress   5.    Patient will be independent with ascend/descend 12 steps using single UE in step over step pattern without LOB.  Baseline:11/19: sometimes has to use hands and feet like a ladder to get up the stairs  09/22/23: Pt utilized R UE for all 12 steps, required BUE's for 2 instances of LOB stepping down on the R LE; assistance provided by therapist to remain upright Goal status: INITIAL   ASSESSMENT:  CLINICAL IMPRESSION: Patient's goals deferred to next session due to patient wearing two different shoes resulting in severe unstableness in standing. Patient performed seated interventions this session due to fall risk with footwear. Patient's condition has the potential to improve in response to therapy. Maximum improvement is yet to be obtained. The anticipated improvement is attainable and reasonable in a generally predictable time.   Pt will continue to benefit from skilled physical therapy intervention to address impairments, improve QOL, and attain therapy goals.      OBJECTIVE IMPAIRMENTS: Abnormal gait, cardiopulmonary status limiting activity, decreased activity tolerance, decreased balance, decreased coordination, decreased mobility, difficulty walking, decreased strength, impaired flexibility, impaired sensation, impaired vision/preception, improper body mechanics, and postural dysfunction.   ACTIVITY LIMITATIONS: carrying, lifting, bending, sitting, standing, squatting, stairs, transfers, bed mobility, dressing, reach over head, hygiene/grooming, locomotion level, and caring for others  PARTICIPATION LIMITATIONS: meal prep, cleaning, laundry, personal finances, driving, shopping, community activity, occupation, and yard work  PERSONAL FACTORS: Age, Fitness, Past/current experiences, Time since onset of injury/illness/exacerbation, and 3+ comorbidities: DM,  HTN, Anemia, Bells palsy, Hypothyroidism,  Kidney stones, stroke and pseudotumour cerebri  are also affecting patient's functional outcome.   REHAB POTENTIAL: Good  CLINICAL DECISION MAKING: Evolving/moderate complexity  EVALUATION COMPLEXITY: Moderate  PLAN:  PT FREQUENCY: 2x/week  PT DURATION: 8 weeks  PLANNED INTERVENTIONS: Therapeutic exercises, Therapeutic activity, Neuromuscular re-education, Balance training, Gait training, Patient/Family education, Self Care, Joint mobilization, Stair training, Vestibular training, Canalith repositioning, Visual/preceptual remediation/compensation, Orthotic/Fit training, DME instructions, Dry Needling, Spinal mobilization, Cryotherapy, Moist heat, Splintting, Taping, Traction, Manual therapy, and Re-evaluation  PLAN FOR NEXT SESSION:  hip flexor strength and ankle strength, balance, resisted multiplanar walking, L LE high level dynamic weight shifting    Precious Bard PT ,DPT Physical Therapist- [facility]  [facility]    11/10/23, 11:40 AM

## 2023-11-10 ENCOUNTER — Ambulatory Visit: Payer: Medicare Other

## 2023-11-10 DIAGNOSIS — R262 Difficulty in walking, not elsewhere classified: Secondary | ICD-10-CM | POA: Diagnosis not present

## 2023-11-10 DIAGNOSIS — R2681 Unsteadiness on feet: Secondary | ICD-10-CM

## 2023-11-10 DIAGNOSIS — M6281 Muscle weakness (generalized): Secondary | ICD-10-CM

## 2023-11-11 ENCOUNTER — Ambulatory Visit: Payer: Medicare Other

## 2023-11-11 NOTE — Therapy (Signed)
 OUTPATIENT PHYSICAL THERAPY TREATMENT/ RECERT  Patient Name: Gregory Crane MRN: [id number] DOB:October 16, 1970, 53 y.o., male Today's Date: 11/12/2023   PCP: Dale Budd Lake MD REFERRING PROVIDER: Dale Bono MD  END OF SESSION:  PT End of Session - 11/12/23 1148     Visit Number 21    Number of Visits 45    Date for PT Re-Evaluation 02/04/24    Authorization Type Medicare (trad)    Authorization Time Period 09/21/22-11/17/23    Progress Note Due on Visit 20    PT Start Time 1146    PT Stop Time 1229    PT Time Calculation (min) 43 min    Equipment Utilized During Treatment Gait belt    Activity Tolerance Patient tolerated treatment well;No increased pain    Behavior During Therapy [facility] for tasks assessed/performed                   Past Medical History:  Diagnosis Date   Allergy    Anemia    Bell's palsy    Diabetes mellitus without complication (HCC)    diet controlled   Fall 07/2023   Hypertension    Hypothyroidism    Kidney stones    Pseudotumor cerebri    Stroke [facility])    Past Surgical History:  Procedure Laterality Date   COLONOSCOPY WITH PROPOFOL N/A 03/30/2020   Procedure: COLONOSCOPY WITH PROPOFOL;  Surgeon: Regis Bill, MD;  Location: [facility] ENDOSCOPY;  Service: Endoscopy;  Laterality: N/A;   LOOP RECORDER INSERTION N/A 01/27/2018   Procedure: LOOP RECORDER INSERTION;  Surgeon: Duke Salvia, MD;  Location: [facility] INVASIVE CV LAB;  Service: Cardiovascular;  Laterality: N/A;   LUMBAR PUNCTURE     as child   NO PAST SURGERIES     REMOVAL OF A DIALYSIS CATHETER     TEE WITHOUT CARDIOVERSION N/A 01/07/2018   Procedure: TRANSESOPHAGEAL ECHOCARDIOGRAM (TEE);  Surgeon: Antonieta Iba, MD;  Location: [facility] ORS;  Service: Cardiovascular;  Laterality: N/A;   Patient Active Problem List   Diagnosis Date Noted   Atrial fibrillation (HCC) 08/10/2023   Atrial fibrillation with rapid ventricular response (HCC) 07/03/2023   Atrial fibrillation with  RVR (HCC) 07/02/2023   NSTEMI (non-ST elevated myocardial infarction) (HCC) 07/02/2023   Fall 06/15/2023   Non-compliance with renal dialysis (HCC) 05/02/2023   Obesity (BMI 30-39.9) 04/30/2023   Volume overload 04/29/2023   Renal osteodystrophy 11/25/2022   Unsteady gait 10/05/2022   Steal syndrome of dialysis vascular access (HCC) 05/28/2022   Hydronephrosis, left 05/04/2022   Leukocytosis 05/04/2022   Pre-op evaluation 04/24/2022   Chest pain 01/13/2022   Diabetic retinopathy associated with diabetes mellitus due to underlying condition (HCC) 01/13/2022   ESRD on hemodialysis (HCC) 01/13/2022   Deafness in right ear 08/11/2021   Open wound 01/25/2021   History of colon polyps 10/15/2020   Postoperative hemorrhage involving digestive system following digestive system procedure 09/19/2020   Acute cholecystitis without calculus 09/09/2020   Type 2 diabetes mellitus, with long-term current use of insulin (HCC) 09/09/2020   Cryptogenic stroke (HCC) 07/13/2020   History of loop recorder 07/13/2020   History of 2019 novel coronavirus disease (COVID-19) 05/20/2020   Pneumonia due to COVID-19 virus 04/03/2020   Elevated troponin 04/03/2020   Acquired trigger finger 06/08/2019   Lymphedema 06/08/2019   Anemia 04/17/2019   Swelling of both lower extremities 01/10/2019   Facial droop 04/09/2018   Daytime somnolence 03/30/2018   Carotid artery disease (HCC) 02/03/2018   Intracranial vascular  stenosis 09/12/2017   Cough 01/20/2017   Bell's palsy 11/10/2016   History of CVA (cerebrovascular accident) 11/10/2016   Benign localized hyperplasia of prostate with urinary obstruction 10/27/2016   History of nephrolithiasis 10/27/2016   TIA (transient ischemic attack) 10/20/2016   Near syncope 06/23/2016   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   Neuropathy 08/06/2015   Health care maintenance 08/06/2015   Essential hypertension 08/06/2015   Heme positive stool 10/10/2013   Hypothyroidism 10/10/2013    Microalbuminuria 10/10/2013   Hyperlipidemia 10/10/2013   B12 deficiency 10/10/2013   Environmental allergies 07/04/2013    ONSET DATE: 3 years ago  REFERRING DIAG: CVA  THERAPY DIAG:  Difficulty in walking, not elsewhere classified  Muscle weakness (generalized)  Unsteadiness on feet  Rationale for Evaluation and Treatment: Rehabilitation  SUBJECTIVE:                                                                                                                                                                                             SUBJECTIVE STATEMENT:  Patient reports feeling good today, wearing correct shoes.    Pt accompanied by: self  PERTINENT HISTORY: Patient returning to PT after hospitalization after being off HD for past 3 months with end stage ESRD. PMH DM, HTN, Anemia, Bells palsy, Hypothyroidism, Kidney stones, stroke and pseudotumour cerebri. PAIN:  Are you having pain? No  PRECAUTIONS: Fall  RED FLAGS: None   WEIGHT BEARING RESTRICTIONS: No  FALLS: Has patient fallen in last 6 months? No  PATIENT GOALS: to get LLE stronger and more steady  OBJECTIVE:   TODAY'S TREATMENT: DATE: 11/12/23  Physical therapy treatment session today consisted of completing assessment of goals and administration of testing as demonstrated and documented in flow sheet, treatment, and goals section of this note. Addition treatments may be found below.    [facility] PT Assessment - 11/12/23 0001       Standardized Balance Assessment   Standardized Balance Assessment Berg Balance Test      Berg Balance Test   Sit to Stand Able to stand without using hands and stabilize independently    Standing Unsupported Able to stand safely 2 minutes    Sitting with Back Unsupported but Feet Supported on Floor or Stool Able to sit safely and securely 2 minutes    Stand to Sit Sits safely with minimal use of hands    Transfers Able to transfer safely, minor use of hands     Standing Unsupported with Eyes Closed Able to stand 10 seconds with supervision    Standing Unsupported with Feet Together Able to place feet together independently and stand for 1  minute with supervision    From Standing, Reach Forward with Outstretched Arm Can reach forward >12 cm safely (5")    From Standing Position, Pick up Object from Floor Able to pick up shoe safely and easily    From Standing Position, Turn to Look Behind Over each Shoulder Looks behind from both sides and weight shifts well    Turn 360 Degrees Able to turn 360 degrees safely but slowly    Standing Unsupported, Alternately Place Feet on Step/Stool Able to stand independently and complete 8 steps >20 seconds    Standing Unsupported, One Foot in Front Able to take small step independently and hold 30 seconds    Standing on One Leg Tries to lift leg/unable to hold 3 seconds but remains standing independently    Total Score 45            Education on Putty for HEP: -grip 10x each hand -finger tip 10x each finger, BUE   Pt required occasional rest breaks due fatigue, PT was attentive to when pt appeared to be tired or winded in order to prevent excessive fatigue.   PATIENT EDUCATION: Education Journalist, newspaper  Person educated: Patient Education method: Explanation, Demonstration, Tactile cues, and Verbal cues Education comprehension: verbalized understanding, returned demonstration, verbal cues required, and tactile cues required  HOME EXERCISE PROGRAM: Access Code: [facility] URL: [email or web address] Date: 06/17/2023 Prepared by: Maureen Ralphs  Exercises - Standing Balance in Corner with Eyes Closed  - 1 x daily - 2-3 x weekly - 3 sets - up to 30 sec hold - Standing Balance in Corner  - 1 x daily - 2-3 x weekly - 3 sets - 30 sec hold - Corner Balance Feet Apart: Eyes Open With Head Turns  - 1 x daily - 2-3 x weekly - 3 sets - 10 reps - Sit to Stand with Arms Crossed  - 1 x  daily - 3 x weekly - 3 sets - 10 reps GOALS: Goals reviewed with patient? Yes  SHORT TERM GOALS: Target date: 06/17/2023   Patient will be independent in home exercise program to improve strength/mobility for better functional independence with ADLs. Baseline: 11/19: compliant  Goal status: MET   LONG TERM GOALS: Target date: 02/04/2024  Patient will increase FOTO score to equal to or greater than   72%  to demonstrate statistically significant improvement in mobility and quality of life.  Baseline: 9/24: 65% 11/19: 60%  Goal status: deferred  2.   Patient (< 65 years old) will complete five times sit to stand test in < 10 seconds indicating an increased LE strength and improved balance. Baseline: 9/24: 20.23 seconds no hands, one near LOB  11/19:15.02 seconds no hands 09/22/23: 17.35 secs no hands 3/19: 14 seconds no hands ; one LOB  Goal status: Partially Met  3.   Patient will increase Berg Balance score by >45/56 to demonstrate decreased fall risk during functional activities.  Baseline: 39  11/19: 44 3/19: 45 Goal status: Partially Met   4.    Patient will increase six minute walk test distance to >1400 for progression to age norm community ambulator and improve gait ability Baseline: 9/24: 1060  11/19: 1070 ft  09/22/23: 964 ft 3/19: interrupted by fire drill.  Goal status: In Progress   5.    Patient will be independent with ascend/descend 12 steps using single UE in step over step pattern without LOB.  Baseline:11/19: sometimes has to use hands and feet like a ladder to get  up the stairs  09/22/23: Pt utilized R UE for all 12 steps, required BUE's for 2 instances of LOB stepping down on the R LE; assistance provided by therapist to remain upright 3/19: BUE support for descending  Goal status: Ongoing  6.   Patient will increase lower extremity functional scale to >60/80 to demonstrate improved functional mobility and increased tolerance with ADLs.  Baseline: 3/19:  40 Goal status: INITIAL   ASSESSMENT:  CLINICAL IMPRESSION: Patient presents with excellent motivation. Will continue POC addressing stability and mobility. He is improving with balance and mobility. New goal addressing LEFS added to POC. Stair descending is challenging for patient due to limited vision and will continue to be an area of safety to focus on. Patient is unable to complete 6 min walk test this session due to interruption of fire drill at facility.   Pt will continue to benefit from skilled physical therapy intervention to address impairments, improve QOL, and attain therapy goals.      OBJECTIVE IMPAIRMENTS: Abnormal gait, cardiopulmonary status limiting activity, decreased activity tolerance, decreased balance, decreased coordination, decreased mobility, difficulty walking, decreased strength, impaired flexibility, impaired sensation, impaired vision/preception, improper body mechanics, and postural dysfunction.   ACTIVITY LIMITATIONS: carrying, lifting, bending, sitting, standing, squatting, stairs, transfers, bed mobility, dressing, reach over head, hygiene/grooming, locomotion level, and caring for others  PARTICIPATION LIMITATIONS: meal prep, cleaning, laundry, personal finances, driving, shopping, community activity, occupation, and yard work  PERSONAL FACTORS: Age, Fitness, Past/current experiences, Time since onset of injury/illness/exacerbation, and 3+ comorbidities: DM, HTN, Anemia, Bells palsy, Hypothyroidism, Kidney stones, stroke and pseudotumour cerebri  are also affecting patient's functional outcome.   REHAB POTENTIAL: Good  CLINICAL DECISION MAKING: Evolving/moderate complexity  EVALUATION COMPLEXITY: Moderate  PLAN:  PT FREQUENCY: 2x/week  PT DURATION: 8 weeks  PLANNED INTERVENTIONS: Therapeutic exercises, Therapeutic activity, Neuromuscular re-education, Balance training, Gait training, Patient/Family education, Self Care, Joint mobilization, Stair  training, Vestibular training, Canalith repositioning, Visual/preceptual remediation/compensation, Orthotic/Fit training, DME instructions, Dry Needling, Spinal mobilization, Cryotherapy, Moist heat, Splintting, Taping, Traction, Manual therapy, and Re-evaluation  PLAN FOR NEXT SESSION:  hip flexor strength and ankle strength, balance, resisted multiplanar walking, L LE high level dynamic weight shifting    Precious Bard PT ,DPT Physical Therapist- [facility]  [facility]    11/12/23, 12:56 PM

## 2023-11-12 ENCOUNTER — Ambulatory Visit: Payer: Medicare Other

## 2023-11-12 DIAGNOSIS — R2681 Unsteadiness on feet: Secondary | ICD-10-CM

## 2023-11-12 DIAGNOSIS — M6281 Muscle weakness (generalized): Secondary | ICD-10-CM

## 2023-11-12 DIAGNOSIS — R262 Difficulty in walking, not elsewhere classified: Secondary | ICD-10-CM

## 2023-11-13 ENCOUNTER — Ambulatory Visit: Payer: Medicare Other

## 2023-11-17 ENCOUNTER — Ambulatory Visit: Payer: Medicare Other | Admitting: Physical Therapy

## 2023-11-17 DIAGNOSIS — M6281 Muscle weakness (generalized): Secondary | ICD-10-CM

## 2023-11-17 DIAGNOSIS — R269 Unspecified abnormalities of gait and mobility: Secondary | ICD-10-CM

## 2023-11-17 DIAGNOSIS — R262 Difficulty in walking, not elsewhere classified: Secondary | ICD-10-CM

## 2023-11-17 DIAGNOSIS — R2681 Unsteadiness on feet: Secondary | ICD-10-CM

## 2023-11-17 NOTE — Therapy (Unsigned)
 OUTPATIENT PHYSICAL THERAPY TREATMENT  Patient Name: Gregory Crane MRN: [id number] DOB:1971-02-13, 53 y.o., male Today's Date: 11/17/2023   PCP: Dale [place] MD REFERRING PROVIDER: Dale Sabin MD  END OF SESSION:  PT End of Session - 11/17/23 1026     Visit Number 22    Number of Visits 45    Date for PT Re-Evaluation 02/04/24    Authorization Type Medicare (trad)    Authorization Time Period 09/21/22-11/17/23    Progress Note Due on Visit 30    PT Start Time 1017    PT Stop Time 1058    PT Time Calculation (min) 41 min    Equipment Utilized During Treatment Gait belt    Activity Tolerance Patient tolerated treatment well;No increased pain    Behavior During Therapy [facility] for tasks assessed/performed                   Past Medical History:  Diagnosis Date   Allergy    Anemia    Bell's palsy    Diabetes mellitus without complication (HCC)    diet controlled   Fall 07/2023   Hypertension    Hypothyroidism    Kidney stones    Pseudotumor cerebri    Stroke [facility])    Past Surgical History:  Procedure Laterality Date   COLONOSCOPY WITH PROPOFOL N/A 03/30/2020   Procedure: COLONOSCOPY WITH PROPOFOL;  Surgeon: Regis Bill, MD;  Location: [facility] ENDOSCOPY;  Service: Endoscopy;  Laterality: N/A;   LOOP RECORDER INSERTION N/A 01/27/2018   Procedure: LOOP RECORDER INSERTION;  Surgeon: Duke Salvia, MD;  Location: [facility] INVASIVE CV LAB;  Service: Cardiovascular;  Laterality: N/A;   LUMBAR PUNCTURE     as child   NO PAST SURGERIES     REMOVAL OF A DIALYSIS CATHETER     TEE WITHOUT CARDIOVERSION N/A 01/07/2018   Procedure: TRANSESOPHAGEAL ECHOCARDIOGRAM (TEE);  Surgeon: Antonieta Iba, MD;  Location: [facility] ORS;  Service: Cardiovascular;  Laterality: N/A;   Patient Active Problem List   Diagnosis Date Noted   Atrial fibrillation (HCC) 08/10/2023   Atrial fibrillation with rapid ventricular response (HCC) 07/03/2023   Atrial fibrillation with RVR  (HCC) 07/02/2023   NSTEMI (non-ST elevated myocardial infarction) (HCC) 07/02/2023   Fall 06/15/2023   Non-compliance with renal dialysis (HCC) 05/02/2023   Obesity (BMI 30-39.9) 04/30/2023   Volume overload 04/29/2023   Renal osteodystrophy 11/25/2022   Unsteady gait 10/05/2022   Steal syndrome of dialysis vascular access (HCC) 05/28/2022   Hydronephrosis, left 05/04/2022   Leukocytosis 05/04/2022   Pre-op evaluation 04/24/2022   Chest pain 01/13/2022   Diabetic retinopathy associated with diabetes mellitus due to underlying condition (HCC) 01/13/2022   ESRD on hemodialysis (HCC) 01/13/2022   Deafness in right ear 08/11/2021   Open wound 01/25/2021   History of colon polyps 10/15/2020   Postoperative hemorrhage involving digestive system following digestive system procedure 09/19/2020   Acute cholecystitis without calculus 09/09/2020   Type 2 diabetes mellitus, with long-term current use of insulin (HCC) 09/09/2020   Cryptogenic stroke (HCC) 07/13/2020   History of loop recorder 07/13/2020   History of 2019 novel coronavirus disease (COVID-19) 05/20/2020   Pneumonia due to COVID-19 virus 04/03/2020   Elevated troponin 04/03/2020   Acquired trigger finger 06/08/2019   Lymphedema 06/08/2019   Anemia 04/17/2019   Swelling of both lower extremities 01/10/2019   Facial droop 04/09/2018   Daytime somnolence 03/30/2018   Carotid artery disease (HCC) 02/03/2018   Intracranial vascular stenosis  09/12/2017   Cough 01/20/2017   Bell's palsy 11/10/2016   History of CVA (cerebrovascular accident) 11/10/2016   Benign localized hyperplasia of prostate with urinary obstruction 10/27/2016   History of nephrolithiasis 10/27/2016   TIA (transient ischemic attack) 10/20/2016   Near syncope 06/23/2016   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   Neuropathy 08/06/2015   Health care maintenance 08/06/2015   Essential hypertension 08/06/2015   Heme positive stool 10/10/2013   Hypothyroidism 10/10/2013    Microalbuminuria 10/10/2013   Hyperlipidemia 10/10/2013   B12 deficiency 10/10/2013   Environmental allergies 07/04/2013    ONSET DATE: 3 years ago  REFERRING DIAG: CVA  THERAPY DIAG:  Difficulty in walking, not elsewhere classified  Muscle weakness (generalized)  Unsteadiness on feet  Abnormality of gait and mobility  Rationale for Evaluation and Treatment: Rehabilitation  SUBJECTIVE:                                                                                                                                                                                             SUBJECTIVE STATEMENT:  Patient reports feeling good today, wearing correct shoes.    Pt accompanied by: self  PERTINENT HISTORY: Patient returning to PT after hospitalization after being off HD for past 3 months with end stage ESRD. PMH DM, HTN, Anemia, Bells palsy, Hypothyroidism, Kidney stones, stroke and pseudotumour cerebri. PAIN:  Are you having pain? No  PRECAUTIONS: Fall  RED FLAGS: None   WEIGHT BEARING RESTRICTIONS: No  FALLS: Has patient fallen in last 6 months? No  PATIENT GOALS: to get LLE stronger and more steady  OBJECTIVE:   TODAY'S TREATMENT: DATE: 11/17/23  TE- To improve strength, endurance, mobility, and function of specific targeted muscle groups or improve joint range of motion or improve muscle flexibility  Nustep seat position 10; intervals between level 2 and level 5; 6 minutes LAQ 2 x 10 ea LE with 5# AW   TA Heel raise standing 12x ; 2 sets  Standing HS curl 2 x 10 ea LE  Eccentric stair navigation x 5 ea  Concentric stair navigation 2 x 5 ea    Neuro Re-ed: Standing with CGA next to support surface:  Airex pad: static stand 30 seconds Airex pad: horizontal head turns 30 seconds scanning room 10x ; cueing for arc of motion  Airex pad: vertical head turns 30 seconds, cueing for arc of motion, noticeable sway with upward gaze increasing demand on ankle righting  reaction musculature Airex lateral step ups x 10 ea side with ad lib UE use    Pt required occasional rest breaks due fatigue, PT was attentive to when pt  appeared to be tired or winded in order to prevent excessive fatigue.   PATIENT EDUCATION: Education Journalist, newspaper  Person educated: Patient Education method: Explanation, Demonstration, Tactile cues, and Verbal cues Education comprehension: verbalized understanding, returned demonstration, verbal cues required, and tactile cues required  HOME EXERCISE PROGRAM: Access Code: [facility] URL: [email or web address] Date: 06/17/2023 Prepared by: Maureen Ralphs  Exercises - Standing Balance in Corner with Eyes Closed  - 1 x daily - 2-3 x weekly - 3 sets - up to 30 sec hold - Standing Balance in Corner  - 1 x daily - 2-3 x weekly - 3 sets - 30 sec hold - Corner Balance Feet Apart: Eyes Open With Head Turns  - 1 x daily - 2-3 x weekly - 3 sets - 10 reps - Sit to Stand with Arms Crossed  - 1 x daily - 3 x weekly - 3 sets - 10 reps GOALS: Goals reviewed with patient? Yes  SHORT TERM GOALS: Target date: 06/17/2023   Patient will be independent in home exercise program to improve strength/mobility for better functional independence with ADLs. Baseline: 11/19: compliant  Goal status: MET   LONG TERM GOALS: Target date: 02/04/2024  Patient will increase FOTO score to equal to or greater than   72%  to demonstrate statistically significant improvement in mobility and quality of life.  Baseline: 9/24: 65% 11/19: 60%  Goal status: deferred  2.   Patient (< 40 years old) will complete five times sit to stand test in < 10 seconds indicating an increased LE strength and improved balance. Baseline: 9/24: 20.23 seconds no hands, one near LOB  11/19:15.02 seconds no hands 09/22/23: 17.35 secs no hands 3/19: 14 seconds no hands ; one LOB  Goal status: Partially Met  3.   Patient will increase Berg Balance score by  >45/56 to demonstrate decreased fall risk during functional activities.  Baseline: 39  11/19: 44 3/19: 45 Goal status: Partially Met   4.    Patient will increase six minute walk test distance to >1400 for progression to age norm community ambulator and improve gait ability Baseline: 9/24: 1060  11/19: 1070 ft  09/22/23: 964 ft 3/19: interrupted by fire drill.  Goal status: In Progress   5.    Patient will be independent with ascend/descend 12 steps using single UE in step over step pattern without LOB.  Baseline:11/19: sometimes has to use hands and feet like a ladder to get up the stairs  09/22/23: Pt utilized R UE for all 12 steps, required BUE's for 2 instances of LOB stepping down on the R LE; assistance provided by therapist to remain upright 3/19: BUE support for descending  Goal status: Ongoing  6.   Patient will increase lower extremity functional scale to >60/80 to demonstrate improved functional mobility and increased tolerance with ADLs.  Baseline: 3/19: 40 Goal status: INITIAL   ASSESSMENT:  CLINICAL IMPRESSION: Patient presents with excellent motivation. Pt challenged with balance and functional mobility training this date. Pt showing quad weakness with several activities including stair navigation. Pt will continue to benefit from skilled physical therapy intervention to address impairments, improve QOL, and attain therapy goals.       OBJECTIVE IMPAIRMENTS: Abnormal gait, cardiopulmonary status limiting activity, decreased activity tolerance, decreased balance, decreased coordination, decreased mobility, difficulty walking, decreased strength, impaired flexibility, impaired sensation, impaired vision/preception, improper body mechanics, and postural dysfunction.   ACTIVITY LIMITATIONS: carrying, lifting, bending, sitting, standing, squatting, stairs, transfers, bed mobility, dressing, reach over head, hygiene/grooming,  locomotion level, and caring for  others  PARTICIPATION LIMITATIONS: meal prep, cleaning, laundry, personal finances, driving, shopping, community activity, occupation, and yard work  PERSONAL FACTORS: Age, Fitness, Past/current experiences, Time since onset of injury/illness/exacerbation, and 3+ comorbidities: DM, HTN, Anemia, Bells palsy, Hypothyroidism, Kidney stones, stroke and pseudotumour cerebri  are also affecting patient's functional outcome.   REHAB POTENTIAL: Good  CLINICAL DECISION MAKING: Evolving/moderate complexity  EVALUATION COMPLEXITY: Moderate  PLAN:  PT FREQUENCY: 2x/week  PT DURATION: 8 weeks  PLANNED INTERVENTIONS: Therapeutic exercises, Therapeutic activity, Neuromuscular re-education, Balance training, Gait training, Patient/Family education, Self Care, Joint mobilization, Stair training, Vestibular training, Canalith repositioning, Visual/preceptual remediation/compensation, Orthotic/Fit training, DME instructions, Dry Needling, Spinal mobilization, Cryotherapy, Moist heat, Splintting, Taping, Traction, Manual therapy, and Re-evaluation  PLAN FOR NEXT SESSION:  hip flexor strength and ankle strength, balance, resisted multiplanar walking, L LE high level dynamic weight shifting    Norman Herrlich PT ,DPT Physical Therapist- [place]  [facility]    11/17/23, 10:28 AM

## 2023-11-18 NOTE — Therapy (Signed)
 OUTPATIENT PHYSICAL THERAPY TREATMENT  Patient Name: Gregory Crane MRN: [id number] DOB:Jan 01, 1971, 53 y.o., male Today's Date: 11/19/2023   PCP: Dale [place] MD REFERRING PROVIDER: Dale [place] MD  END OF SESSION:  PT End of Session - 11/19/23 1142     Visit Number 23    Number of Visits 45    Date for PT Re-Evaluation 02/04/24    Authorization Type Medicare (trad)    Authorization Time Period 09/21/22-11/17/23    Progress Note Due on Visit 30    PT Start Time 1145    PT Stop Time 1229    PT Time Calculation (min) 44 min    Equipment Utilized During Treatment Gait belt    Activity Tolerance Patient tolerated treatment well;No increased pain    Behavior During Therapy [facility] for tasks assessed/performed                    Past Medical History:  Diagnosis Date   Allergy    Anemia    Bell's palsy    Diabetes mellitus without complication (HCC)    diet controlled   Fall 07/2023   Hypertension    Hypothyroidism    Kidney stones    Pseudotumor cerebri    Stroke [facility])    Past Surgical History:  Procedure Laterality Date   COLONOSCOPY WITH PROPOFOL N/A 03/30/2020   Procedure: COLONOSCOPY WITH PROPOFOL;  Surgeon: Regis Bill, MD;  Location: [facility] ENDOSCOPY;  Service: Endoscopy;  Laterality: N/A;   LOOP RECORDER INSERTION N/A 01/27/2018   Procedure: LOOP RECORDER INSERTION;  Surgeon: Duke Salvia, MD;  [facility];  Service: Cardiovascular;  Laterality: N/A;   LUMBAR PUNCTURE     as child   NO PAST SURGERIES     REMOVAL OF A DIALYSIS CATHETER     TEE WITHOUT CARDIOVERSION N/A 01/07/2018   Procedure: TRANSESOPHAGEAL ECHOCARDIOGRAM (TEE);  Surgeon: Antonieta Iba, MD;  Location: [facility] ORS;  Service: Cardiovascular;  Laterality: N/A;   Patient Active Problem List   Diagnosis Date Noted   Atrial fibrillation (HCC) 08/10/2023   Atrial fibrillation with rapid ventricular response (HCC) 07/03/2023   Atrial fibrillation with RVR  (HCC) 07/02/2023   NSTEMI (non-ST elevated myocardial infarction) (HCC) 07/02/2023   Fall 06/15/2023   Non-compliance with renal dialysis (HCC) 05/02/2023   Obesity (BMI 30-39.9) 04/30/2023   Volume overload 04/29/2023   Renal osteodystrophy 11/25/2022   Unsteady gait 10/05/2022   Steal syndrome of dialysis vascular access (HCC) 05/28/2022   Hydronephrosis, left 05/04/2022   Leukocytosis 05/04/2022   Pre-op evaluation 04/24/2022   Chest pain 01/13/2022   Diabetic retinopathy associated with diabetes mellitus due to underlying condition (HCC) 01/13/2022   ESRD on hemodialysis (HCC) 01/13/2022   Deafness in right ear 08/11/2021   Open wound 01/25/2021   History of colon polyps 10/15/2020   Postoperative hemorrhage involving digestive system following digestive system procedure 09/19/2020   Acute cholecystitis without calculus 09/09/2020   Type 2 diabetes mellitus, with long-term current use of insulin (HCC) 09/09/2020   Cryptogenic stroke (HCC) 07/13/2020   History of loop recorder 07/13/2020   History of 2019 novel coronavirus disease (COVID-19) 05/20/2020   Pneumonia due to COVID-19 virus 04/03/2020   Elevated troponin 04/03/2020   Acquired trigger finger 06/08/2019   Lymphedema 06/08/2019   Anemia 04/17/2019   Swelling of both lower extremities 01/10/2019   Facial droop 04/09/2018   Daytime somnolence 03/30/2018   Carotid artery disease (HCC) 02/03/2018   Intracranial vascular  stenosis 09/12/2017   Cough 01/20/2017   Bell's palsy 11/10/2016   History of CVA (cerebrovascular accident) 11/10/2016   Benign localized hyperplasia of prostate with urinary obstruction 10/27/2016   History of nephrolithiasis 10/27/2016   TIA (transient ischemic attack) 10/20/2016   Near syncope 06/23/2016   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   Neuropathy 08/06/2015   Health care maintenance 08/06/2015   Essential hypertension 08/06/2015   Heme positive stool 10/10/2013   Hypothyroidism 10/10/2013    Microalbuminuria 10/10/2013   Hyperlipidemia 10/10/2013   B12 deficiency 10/10/2013   Environmental allergies 07/04/2013    ONSET DATE: 3 years ago  REFERRING DIAG: CVA  THERAPY DIAG:  Difficulty in walking, not elsewhere classified  Muscle weakness (generalized)  Unsteadiness on feet  Abnormality of gait and mobility  Rationale for Evaluation and Treatment: Rehabilitation  SUBJECTIVE:                                                                                                                                                                                             SUBJECTIVE STATEMENT:  Patient reports his movements feel ok, not the best not the worst. Slept ok the other night.    Pt accompanied by: self  PERTINENT HISTORY: Patient returning to PT after hospitalization after being off HD for past 3 months with end stage ESRD. PMH DM, HTN, Anemia, Bells palsy, Hypothyroidism, Kidney stones, stroke and pseudotumour cerebri. PAIN:  Are you having pain? No  PRECAUTIONS: Fall  RED FLAGS: None   WEIGHT BEARING RESTRICTIONS: No  FALLS: Has patient fallen in last 6 months? No  PATIENT GOALS: to get LLE stronger and more steady  OBJECTIVE:   TODAY'S TREATMENT: DATE: 11/19/23    TA 25 sit to stands with two rest breaks  6" step: -step up/down 10x each LE  Heel raise standing 12x ; 2 sets  Standing HS curl 2 x 10 ea LE   Seated BTB hamstring curl 15x each LE  BTB PF press 10x Adduction ball squeeze 15x   Neuro Re-ed: Standing with CGA next to support surface:  Airex pad: static stand 30 seconds Airex pad: horizontal head turns 30 seconds scanning room 10x ; cueing for arc of motion  Airex pad: vertical head turns 30 seconds, cueing for arc of motion, noticeable sway with upward gaze increasing demand on ankle righting reaction musculature Airex pad: 6" step tandem stance 30 seconds each LE   Bosu ball: Round sideup  -forward step up 10x each LE    Pt required occasional rest breaks due fatigue, PT was attentive to when pt appeared to be tired or winded in order  to prevent excessive fatigue.   PATIENT EDUCATION: Education Journalist, newspaper  Person educated: Patient Education method: Explanation, Demonstration, Tactile cues, and Verbal cues Education comprehension: verbalized understanding, returned demonstration, verbal cues required, and tactile cues required  HOME EXERCISE PROGRAM: Access Code: [facility] URL: [email or web address] Date: 06/17/2023 Prepared by: Maureen Ralphs  Exercises - Standing Balance in Corner with Eyes Closed  - 1 x daily - 2-3 x weekly - 3 sets - up to 30 sec hold - Standing Balance in Corner  - 1 x daily - 2-3 x weekly - 3 sets - 30 sec hold - Corner Balance Feet Apart: Eyes Open With Head Turns  - 1 x daily - 2-3 x weekly - 3 sets - 10 reps - Sit to Stand with Arms Crossed  - 1 x daily - 3 x weekly - 3 sets - 10 reps GOALS: Goals reviewed with patient? Yes  SHORT TERM GOALS: Target date: 06/17/2023   Patient will be independent in home exercise program to improve strength/mobility for better functional independence with ADLs. Baseline: 11/19: compliant  Goal status: MET   LONG TERM GOALS: Target date: 02/04/2024  Patient will increase FOTO score to equal to or greater than   72%  to demonstrate statistically significant improvement in mobility and quality of life.  Baseline: 9/24: 65% 11/19: 60%  Goal status: deferred  2.   Patient (< 57 years old) will complete five times sit to stand test in < 10 seconds indicating an increased LE strength and improved balance. Baseline: 9/24: 20.23 seconds no hands, one near LOB  11/19:15.02 seconds no hands 09/22/23: 17.35 secs no hands 3/19: 14 seconds no hands ; one LOB  Goal status: Partially Met  3.   Patient will increase Berg Balance score by >45/56 to demonstrate decreased fall risk during functional activities.   Baseline: 39  11/19: 44 3/19: 45 Goal status: Partially Met   4.    Patient will increase six minute walk test distance to >1400 for progression to age norm community ambulator and improve gait ability Baseline: 9/24: 1060  11/19: 1070 ft  09/22/23: 964 ft 3/19: interrupted by fire drill.  Goal status: In Progress   5.    Patient will be independent with ascend/descend 12 steps using single UE in step over step pattern without LOB.  Baseline:11/19: sometimes has to use hands and feet like a ladder to get up the stairs  09/22/23: Pt utilized R UE for all 12 steps, required BUE's for 2 instances of LOB stepping down on the R LE; assistance provided by therapist to remain upright 3/19: BUE support for descending  Goal status: Ongoing  6.   Patient will increase lower extremity functional scale to >60/80 to demonstrate improved functional mobility and increased tolerance with ADLs.  Baseline: 3/19: 40 Goal status: INITIAL   ASSESSMENT:  CLINICAL IMPRESSION: Patient presents with excellent motivation. He has a few posterior LOB with sit to stands requiring two rest breaks due to fatigue. Patient is challenged with maintaining neutral alignment of L ankle with unstable surfaces.  Pt will continue to benefit from skilled physical therapy intervention to address impairments, improve QOL, and attain therapy goals.       OBJECTIVE IMPAIRMENTS: Abnormal gait, cardiopulmonary status limiting activity, decreased activity tolerance, decreased balance, decreased coordination, decreased mobility, difficulty walking, decreased strength, impaired flexibility, impaired sensation, impaired vision/preception, improper body mechanics, and postural dysfunction.   ACTIVITY LIMITATIONS: carrying, lifting, bending, sitting, standing, squatting, stairs, transfers, bed mobility, dressing, reach  over head, hygiene/grooming, locomotion level, and caring for others  PARTICIPATION LIMITATIONS: meal prep,  cleaning, laundry, personal finances, driving, shopping, community activity, occupation, and yard work  PERSONAL FACTORS: Age, Fitness, Past/current experiences, Time since onset of injury/illness/exacerbation, and 3+ comorbidities: DM, HTN, Anemia, Bells palsy, Hypothyroidism, Kidney stones, stroke and pseudotumour cerebri  are also affecting patient's functional outcome.   REHAB POTENTIAL: Good  CLINICAL DECISION MAKING: Evolving/moderate complexity  EVALUATION COMPLEXITY: Moderate  PLAN:  PT FREQUENCY: 2x/week  PT DURATION: 8 weeks  PLANNED INTERVENTIONS: Therapeutic exercises, Therapeutic activity, Neuromuscular re-education, Balance training, Gait training, Patient/Family education, Self Care, Joint mobilization, Stair training, Vestibular training, Canalith repositioning, Visual/preceptual remediation/compensation, Orthotic/Fit training, DME instructions, Dry Needling, Spinal mobilization, Cryotherapy, Moist heat, Splintting, Taping, Traction, Manual therapy, and Re-evaluation  PLAN FOR NEXT SESSION:  hip flexor strength and ankle strength, balance, resisted multiplanar walking, L LE high level dynamic weight shifting    Precious Bard PT ,DPT Physical Therapist- [name]  [facility]    11/19/23, 12:30 PM

## 2023-11-19 ENCOUNTER — Ambulatory Visit: Payer: Medicare Other

## 2023-11-19 DIAGNOSIS — R269 Unspecified abnormalities of gait and mobility: Secondary | ICD-10-CM

## 2023-11-19 DIAGNOSIS — R2681 Unsteadiness on feet: Secondary | ICD-10-CM

## 2023-11-19 DIAGNOSIS — R262 Difficulty in walking, not elsewhere classified: Secondary | ICD-10-CM | POA: Diagnosis not present

## 2023-11-19 DIAGNOSIS — M6281 Muscle weakness (generalized): Secondary | ICD-10-CM

## 2023-11-24 ENCOUNTER — Ambulatory Visit: Payer: Medicare Other

## 2023-11-24 DIAGNOSIS — R262 Difficulty in walking, not elsewhere classified: Secondary | ICD-10-CM | POA: Diagnosis not present

## 2023-11-24 DIAGNOSIS — R2681 Unsteadiness on feet: Secondary | ICD-10-CM

## 2023-11-24 DIAGNOSIS — M6281 Muscle weakness (generalized): Secondary | ICD-10-CM

## 2023-11-24 NOTE — Therapy (Signed)
 OUTPATIENT PHYSICAL THERAPY TREATMENT  Patient Name: Gregory Crane MRN: [id number] DOB:20-May-1971, 53 y.o., male Today's Date: 11/24/2023   PCP: Dale [place] MD REFERRING PROVIDER: Dale [place] MD  END OF SESSION:  PT End of Session - 11/24/23 1102     Visit Number 24    Number of Visits 45    Date for PT Re-Evaluation 02/04/24    Authorization Type Medicare (trad)    Authorization Time Period 09/21/22-11/17/23    Progress Note Due on Visit 30    Equipment Utilized During Treatment Gait belt    Activity Tolerance Patient tolerated treatment well;No increased pain    Behavior During Therapy [facility] for tasks assessed/performed                    Past Medical History:  Diagnosis Date   Allergy    Anemia    Bell's palsy    Diabetes mellitus without complication (HCC)    diet controlled   Fall 07/2023   Hypertension    Hypothyroidism    Kidney stones    Pseudotumor cerebri    Stroke [facility])    Past Surgical History:  Procedure Laterality Date   COLONOSCOPY WITH PROPOFOL N/A 03/30/2020   Procedure: COLONOSCOPY WITH PROPOFOL;  Surgeon: Regis Bill, MD;  Location: [facility] ENDOSCOPY;  Service: Endoscopy;  Laterality: N/A;   LOOP RECORDER INSERTION N/A 01/27/2018   Procedure: LOOP RECORDER INSERTION;  Surgeon: Duke Salvia, MD;  Location: [facility] INVASIVE CV LAB;  Service: Cardiovascular;  Laterality: N/A;   LUMBAR PUNCTURE     as child   NO PAST SURGERIES     REMOVAL OF A DIALYSIS CATHETER     TEE WITHOUT CARDIOVERSION N/A 01/07/2018   Procedure: TRANSESOPHAGEAL ECHOCARDIOGRAM (TEE);  Surgeon: Antonieta Iba, MD;  Location: [facility] ORS;  Service: Cardiovascular;  Laterality: N/A;   Patient Active Problem List   Diagnosis Date Noted   Atrial fibrillation (HCC) 08/10/2023   Atrial fibrillation with rapid ventricular response (HCC) 07/03/2023   Atrial fibrillation with RVR (HCC) 07/02/2023   NSTEMI (non-ST elevated myocardial infarction) (HCC) 07/02/2023    Fall 06/15/2023   Non-compliance with renal dialysis (HCC) 05/02/2023   Obesity (BMI 30-39.9) 04/30/2023   Volume overload 04/29/2023   Renal osteodystrophy 11/25/2022   Unsteady gait 10/05/2022   Steal syndrome of dialysis vascular access (HCC) 05/28/2022   Hydronephrosis, left 05/04/2022   Leukocytosis 05/04/2022   Pre-op evaluation 04/24/2022   Chest pain 01/13/2022   Diabetic retinopathy associated with diabetes mellitus due to underlying condition (HCC) 01/13/2022   ESRD on hemodialysis (HCC) 01/13/2022   Deafness in right ear 08/11/2021   Open wound 01/25/2021   History of colon polyps 10/15/2020   Postoperative hemorrhage involving digestive system following digestive system procedure 09/19/2020   Acute cholecystitis without calculus 09/09/2020   Type 2 diabetes mellitus, with long-term current use of insulin (HCC) 09/09/2020   Cryptogenic stroke (HCC) 07/13/2020   History of loop recorder 07/13/2020   History of 2019 novel coronavirus disease (COVID-19) 05/20/2020   Pneumonia due to COVID-19 virus 04/03/2020   Elevated troponin 04/03/2020   Acquired trigger finger 06/08/2019   Lymphedema 06/08/2019   Anemia 04/17/2019   Swelling of both lower extremities 01/10/2019   Facial droop 04/09/2018   Daytime somnolence 03/30/2018   Carotid artery disease (HCC) 02/03/2018   Intracranial vascular stenosis 09/12/2017   Cough 01/20/2017   Bell's palsy 11/10/2016   History of CVA (cerebrovascular accident) 11/10/2016   Benign localized  hyperplasia of prostate with urinary obstruction 10/27/2016   History of nephrolithiasis 10/27/2016   TIA (transient ischemic attack) 10/20/2016   Near syncope 06/23/2016   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   Neuropathy 08/06/2015   Health care maintenance 08/06/2015   Essential hypertension 08/06/2015   Heme positive stool 10/10/2013   Hypothyroidism 10/10/2013   Microalbuminuria 10/10/2013   Hyperlipidemia 10/10/2013   B12 deficiency  10/10/2013   Environmental allergies 07/04/2013    ONSET DATE: 3 years ago  REFERRING DIAG: CVA  THERAPY DIAG:  No diagnosis found.  Rationale for Evaluation and Treatment: Rehabilitation  SUBJECTIVE:                                                                                                                                                                                             SUBJECTIVE STATEMENT:  Pt reports everything going OK. No updates. HEP is going OK.    Pt accompanied by: self  PERTINENT HISTORY: Patient returning to PT after hospitalization after being off HD for past 3 months with end stage ESRD. PMH DM, HTN, Anemia, Bells palsy, Hypothyroidism, Kidney stones, stroke and pseudotumour cerebri. PAIN:  Are you having pain? No  PRECAUTIONS: Fall  RED FLAGS: None   WEIGHT BEARING RESTRICTIONS: No  FALLS: Has patient fallen in last 6 months? No  PATIENT GOALS: to get LLE stronger and more steady  OBJECTIVE:   TODAY'S TREATMENT: DATE: 11/24/23  TA Nustep seat position 10; intervals between level 2 and level 5; 6 minutes. Pt did complete warm-up minute on lvl 1.   STS: 1x10 rates medium rest break provided. Pt then completes 3x5. Some decreased eccentric control with fatigue. Increasingly difficulty with reps.   Heel raise seated 12x ; 2 sets - modified to seated due to fatigue   Adduction ball squeeze 15x   6" step: -step tap 2x5 each LE first seated - more difficulty with LLE. Rates medium  BTB PF press 10x 2 sets   Standing HS curl 2 x 10 ea LE   Seated BTB hamstring curl 15x each LE - rates "a solid medium"    Neuro Re-ed: Standing with CGA next to support surface:  Airex pad: static stand 30 seconds Airex pad: horizontal head turns 10x each way Airex pad: vertical head turns 10x each way - intermittent UE support Airex pad semi-tandem stance 2x30 seconds each LE     Pt required occasional rest breaks due fatigue, PT was attentive  to when pt appeared to be tired or winded in order to prevent excessive fatigue.   PATIENT EDUCATION: Education details:exercise technique  Person educated: Patient Education method:  Explanation, Demonstration, Tactile cues, and Verbal cues Education comprehension: verbalized understanding, returned demonstration, verbal cues required, and tactile cues required  HOME EXERCISE PROGRAM: Access Code: [facility] URL: [email or web address] Date: 06/17/2023 Prepared by: Maureen Ralphs  Exercises - Standing Balance in Corner with Eyes Closed  - 1 x daily - 2-3 x weekly - 3 sets - up to 30 sec hold - Standing Balance in Corner  - 1 x daily - 2-3 x weekly - 3 sets - 30 sec hold - Corner Balance Feet Apart: Eyes Open With Head Turns  - 1 x daily - 2-3 x weekly - 3 sets - 10 reps - Sit to Stand with Arms Crossed  - 1 x daily - 3 x weekly - 3 sets - 10 reps GOALS: Goals reviewed with patient? Yes  SHORT TERM GOALS: Target date: 06/17/2023   Patient will be independent in home exercise program to improve strength/mobility for better functional independence with ADLs. Baseline: 11/19: compliant  Goal status: MET   LONG TERM GOALS: Target date: 02/04/2024  Patient will increase FOTO score to equal to or greater than   72%  to demonstrate statistically significant improvement in mobility and quality of life.  Baseline: 9/24: 65% 11/19: 60%  Goal status: deferred  2.   Patient (< 72 years old) will complete five times sit to stand test in < 10 seconds indicating an increased LE strength and improved balance. Baseline: 9/24: 20.23 seconds no hands, one near LOB  11/19:15.02 seconds no hands 09/22/23: 17.35 secs no hands 3/19: 14 seconds no hands ; one LOB  Goal status: Partially Met  3.   Patient will increase Berg Balance score by >45/56 to demonstrate decreased fall risk during functional activities.  Baseline: 39  11/19: 44 3/19: 45 Goal status: Partially Met   4.     Patient will increase six minute walk test distance to >1400 for progression to age norm community ambulator and improve gait ability Baseline: 9/24: 1060  11/19: 1070 ft  09/22/23: 964 ft 3/19: interrupted by fire drill.  Goal status: In Progress   5.    Patient will be independent with ascend/descend 12 steps using single UE in step over step pattern without LOB.  Baseline:11/19: sometimes has to use hands and feet like a ladder to get up the stairs  09/22/23: Pt utilized R UE for all 12 steps, required BUE's for 2 instances of LOB stepping down on the R LE; assistance provided by therapist to remain upright 3/19: BUE support for descending  Goal status: Ongoing  6.   Patient will increase lower extremity functional scale to >60/80 to demonstrate improved functional mobility and increased tolerance with ADLs.  Baseline: 3/19: 40 Goal status: INITIAL   ASSESSMENT:  CLINICAL IMPRESSION: Author continued plan of care as laid out in previous sessions. PT did modified some exercises to easier versions as pt fatigued today with soreness reported from previous visit.  Pt will continue to benefit from skilled physical therapy intervention to address impairments, improve QOL, and attain therapy goals.       OBJECTIVE IMPAIRMENTS: Abnormal gait, cardiopulmonary status limiting activity, decreased activity tolerance, decreased balance, decreased coordination, decreased mobility, difficulty walking, decreased strength, impaired flexibility, impaired sensation, impaired vision/preception, improper body mechanics, and postural dysfunction.   ACTIVITY LIMITATIONS: carrying, lifting, bending, sitting, standing, squatting, stairs, transfers, bed mobility, dressing, reach over head, hygiene/grooming, locomotion level, and caring for others  PARTICIPATION LIMITATIONS: meal prep, cleaning, laundry, personal finances, driving, shopping, community activity, occupation,  and yard work  PERSONAL FACTORS:  Age, Fitness, Past/current experiences, Time since onset of injury/illness/exacerbation, and 3+ comorbidities: DM, HTN, Anemia, Bells palsy, Hypothyroidism, Kidney stones, stroke and pseudotumour cerebri  are also affecting patient's functional outcome.   REHAB POTENTIAL: Good  CLINICAL DECISION MAKING: Evolving/moderate complexity  EVALUATION COMPLEXITY: Moderate  PLAN:  PT FREQUENCY: 2x/week  PT DURATION: 8 weeks  PLANNED INTERVENTIONS: Therapeutic exercises, Therapeutic activity, Neuromuscular re-education, Balance training, Gait training, Patient/Family education, Self Care, Joint mobilization, Stair training, Vestibular training, Canalith repositioning, Visual/preceptual remediation/compensation, Orthotic/Fit training, DME instructions, Dry Needling, Spinal mobilization, Cryotherapy, Moist heat, Splintting, Taping, Traction, Manual therapy, and Re-evaluation  PLAN FOR NEXT SESSION:  hip flexor strength and ankle strength, balance, resisted multiplanar walking, L LE high level dynamic weight shifting    Baird Kay PT ,DPT Physical Therapist- [place]  [facility]    11/24/23, 11:03 AM

## 2023-11-25 LAB — HM DIABETES EYE EXAM

## 2023-11-25 NOTE — Therapy (Signed)
 OUTPATIENT PHYSICAL THERAPY TREATMENT  Patient Name: Gregory Crane MRN: [id number] DOB:1971/07/16, 53 y.o., male Today's Date: 11/26/2023   PCP: Dale Sanford MD REFERRING PROVIDER: Dale  MD  END OF SESSION:  PT End of Session - 11/26/23 1110     Visit Number 25    Number of Visits 45    Date for PT Re-Evaluation 02/04/24    Authorization Type Medicare (trad)    Authorization Time Period 09/21/22-11/17/23    Progress Note Due on Visit 30    PT Start Time 1104    PT Stop Time 1146    PT Time Calculation (min) 42 min    Equipment Utilized During Treatment Gait belt    Activity Tolerance Patient tolerated treatment well;No increased pain;Patient limited by fatigue    Behavior During Therapy [facility] for tasks assessed/performed                     Past Medical History:  Diagnosis Date   Allergy    Anemia    Bell's palsy    Diabetes mellitus without complication (HCC)    diet controlled   Fall 07/2023   Hypertension    Hypothyroidism    Kidney stones    Pseudotumor cerebri    Stroke [facility])    Past Surgical History:  Procedure Laterality Date   COLONOSCOPY WITH PROPOFOL N/A 03/30/2020   Procedure: COLONOSCOPY WITH PROPOFOL;  Surgeon: Regis Bill, MD;  Location: [facility] ENDOSCOPY;  Service: Endoscopy;  Laterality: N/A;   LOOP RECORDER INSERTION N/A 01/27/2018   Procedure: LOOP RECORDER INSERTION;  Surgeon: Duke Salvia, MD;  Location: [facility] INVASIVE CV LAB;  Service: Cardiovascular;  Laterality: N/A;   LUMBAR PUNCTURE     as child   NO PAST SURGERIES     REMOVAL OF A DIALYSIS CATHETER     TEE WITHOUT CARDIOVERSION N/A 01/07/2018   Procedure: TRANSESOPHAGEAL ECHOCARDIOGRAM (TEE);  Surgeon: Antonieta Iba, MD;  Location: [facility] ORS;  Service: Cardiovascular;  Laterality: N/A;   Patient Active Problem List   Diagnosis Date Noted   Atrial fibrillation (HCC) 08/10/2023   Atrial fibrillation with rapid ventricular response (HCC) 07/03/2023    Atrial fibrillation with RVR (HCC) 07/02/2023   NSTEMI (non-ST elevated myocardial infarction) (HCC) 07/02/2023   Fall 06/15/2023   Non-compliance with renal dialysis (HCC) 05/02/2023   Obesity (BMI 30-39.9) 04/30/2023   Volume overload 04/29/2023   Renal osteodystrophy 11/25/2022   Unsteady gait 10/05/2022   Steal syndrome of dialysis vascular access (HCC) 05/28/2022   Hydronephrosis, left 05/04/2022   Leukocytosis 05/04/2022   Pre-op evaluation 04/24/2022   Chest pain 01/13/2022   Diabetic retinopathy associated with diabetes mellitus due to underlying condition (HCC) 01/13/2022   ESRD on hemodialysis (HCC) 01/13/2022   Deafness in right ear 08/11/2021   Open wound 01/25/2021   History of colon polyps 10/15/2020   Postoperative hemorrhage involving digestive system following digestive system procedure 09/19/2020   Acute cholecystitis without calculus 09/09/2020   Type 2 diabetes mellitus, with long-term current use of insulin (HCC) 09/09/2020   Cryptogenic stroke (HCC) 07/13/2020   History of loop recorder 07/13/2020   History of 2019 novel coronavirus disease (COVID-19) 05/20/2020   Pneumonia due to COVID-19 virus 04/03/2020   Elevated troponin 04/03/2020   Acquired trigger finger 06/08/2019   Lymphedema 06/08/2019   Anemia 04/17/2019   Swelling of both lower extremities 01/10/2019   Facial droop 04/09/2018   Daytime somnolence 03/30/2018   Carotid artery disease (HCC) 02/03/2018  Intracranial vascular stenosis 09/12/2017   Cough 01/20/2017   Bell's palsy 11/10/2016   History of CVA (cerebrovascular accident) 11/10/2016   Benign localized hyperplasia of prostate with urinary obstruction 10/27/2016   History of nephrolithiasis 10/27/2016   TIA (transient ischemic attack) 10/20/2016   Near syncope 06/23/2016   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   Neuropathy 08/06/2015   Health care maintenance 08/06/2015   Essential hypertension 08/06/2015   Heme positive stool 10/10/2013    Hypothyroidism 10/10/2013   Microalbuminuria 10/10/2013   Hyperlipidemia 10/10/2013   B12 deficiency 10/10/2013   Environmental allergies 07/04/2013    ONSET DATE: 3 years ago  REFERRING DIAG: CVA  THERAPY DIAG:  Muscle weakness (generalized)  Unsteadiness on feet  Difficulty in walking, not elsewhere classified  Abnormality of gait and mobility  Rationale for Evaluation and Treatment: Rehabilitation  SUBJECTIVE:                                                                                                                                                                                             SUBJECTIVE STATEMENT:  Patient had dialysis yesterday, had shots in his eyes.    Pt accompanied by: self  PERTINENT HISTORY: Patient returning to PT after hospitalization after being off HD for past 3 months with end stage ESRD. PMH DM, HTN, Anemia, Bells palsy, Hypothyroidism, Kidney stones, stroke and pseudotumour cerebri. PAIN:  Are you having pain? No  PRECAUTIONS: Fall  RED FLAGS: None   WEIGHT BEARING RESTRICTIONS: No  FALLS: Has patient fallen in last 6 months? No  PATIENT GOALS: to get LLE stronger and more steady  OBJECTIVE:   TODAY'S TREATMENT: DATE: 11/26/23  TA  STS:15x; 2sets Some decreased eccentric control with fatigue.   LAQ 12x each LE   6" step: -alternate tapping 10x each LE -step up/down 10x each LE  BTB PF press 10x 2 sets   Standing heel raises 20x   Seated BTB hamstring curl 15x each LE - rates "a solid medium"    Neuro Re-ed: Standing with CGA next to support surface:  Airex pad: static stand 30 seconds Airex pad: horizontal head turns 10x each way Airex pad: vertical head turns 10x each way - intermittent UE support     Pt required occasional rest breaks due fatigue, PT was attentive to when pt appeared to be tired or winded in order to prevent excessive fatigue.   PATIENT EDUCATION: Education Journalist, newspaper   Person educated: Patient Education method: Explanation, Demonstration, Tactile cues, and Verbal cues Education comprehension: verbalized understanding, returned demonstration, verbal cues required, and tactile cues required  HOME EXERCISE PROGRAM: Access Code: [facility]  URL: [email or web address] Date: 06/17/2023 Prepared by: Maureen Ralphs  Exercises - Standing Balance in Corner with Eyes Closed  - 1 x daily - 2-3 x weekly - 3 sets - up to 30 sec hold - Standing Balance in Corner  - 1 x daily - 2-3 x weekly - 3 sets - 30 sec hold - Corner Balance Feet Apart: Eyes Open With Head Turns  - 1 x daily - 2-3 x weekly - 3 sets - 10 reps - Sit to Stand with Arms Crossed  - 1 x daily - 3 x weekly - 3 sets - 10 reps GOALS: Goals reviewed with patient? Yes  SHORT TERM GOALS: Target date: 06/17/2023   Patient will be independent in home exercise program to improve strength/mobility for better functional independence with ADLs. Baseline: 11/19: compliant  Goal status: MET   LONG TERM GOALS: Target date: 02/04/2024  Patient will increase FOTO score to equal to or greater than   72%  to demonstrate statistically significant improvement in mobility and quality of life.  Baseline: 9/24: 65% 11/19: 60%  Goal status: deferred  2.   Patient (< 65 years old) will complete five times sit to stand test in < 10 seconds indicating an increased LE strength and improved balance. Baseline: 9/24: 20.23 seconds no hands, one near LOB  11/19:15.02 seconds no hands 09/22/23: 17.35 secs no hands 3/19: 14 seconds no hands ; one LOB  Goal status: Partially Met  3.   Patient will increase Berg Balance score by >45/56 to demonstrate decreased fall risk during functional activities.  Baseline: 39  11/19: 44 3/19: 45 Goal status: Partially Met   4.    Patient will increase six minute walk test distance to >1400 for progression to age norm community ambulator and improve gait  ability Baseline: 9/24: 1060  11/19: 1070 ft  09/22/23: 964 ft 3/19: interrupted by fire drill.  Goal status: In Progress   5.    Patient will be independent with ascend/descend 12 steps using single UE in step over step pattern without LOB.  Baseline:11/19: sometimes has to use hands and feet like a ladder to get up the stairs  09/22/23: Pt utilized R UE for all 12 steps, required BUE's for 2 instances of LOB stepping down on the R LE; assistance provided by therapist to remain upright 3/19: BUE support for descending  Goal status: Ongoing  6.   Patient will increase lower extremity functional scale to >60/80 to demonstrate improved functional mobility and increased tolerance with ADLs.  Baseline: 3/19: 40 Goal status: INITIAL   ASSESSMENT:  CLINICAL IMPRESSION: Patient is highly motivated throughout session. He is able to self correct with imbalances in standing with sit to stands.   Pt will continue to benefit from skilled physical therapy intervention to address impairments, improve QOL, and attain therapy goals.       OBJECTIVE IMPAIRMENTS: Abnormal gait, cardiopulmonary status limiting activity, decreased activity tolerance, decreased balance, decreased coordination, decreased mobility, difficulty walking, decreased strength, impaired flexibility, impaired sensation, impaired vision/preception, improper body mechanics, and postural dysfunction.   ACTIVITY LIMITATIONS: carrying, lifting, bending, sitting, standing, squatting, stairs, transfers, bed mobility, dressing, reach over head, hygiene/grooming, locomotion level, and caring for others  PARTICIPATION LIMITATIONS: meal prep, cleaning, laundry, personal finances, driving, shopping, community activity, occupation, and yard work  PERSONAL FACTORS: Age, Fitness, Past/current experiences, Time since onset of injury/illness/exacerbation, and 3+ comorbidities: DM, HTN, Anemia, Bells palsy, Hypothyroidism, Kidney stones, stroke and  pseudotumour cerebri  are also affecting  patient's functional outcome.   REHAB POTENTIAL: Good  CLINICAL DECISION MAKING: Evolving/moderate complexity  EVALUATION COMPLEXITY: Moderate  PLAN:  PT FREQUENCY: 2x/week  PT DURATION: 8 weeks  PLANNED INTERVENTIONS: Therapeutic exercises, Therapeutic activity, Neuromuscular re-education, Balance training, Gait training, Patient/Family education, Self Care, Joint mobilization, Stair training, Vestibular training, Canalith repositioning, Visual/preceptual remediation/compensation, Orthotic/Fit training, DME instructions, Dry Needling, Spinal mobilization, Cryotherapy, Moist heat, Splintting, Taping, Traction, Manual therapy, and Re-evaluation  PLAN FOR NEXT SESSION:  hip flexor strength and ankle strength, balance, resisted multiplanar walking, L LE high level dynamic weight shifting    Precious Bard PT ,DPT Physical Therapist- [facility]  [facility]    11/26/23, 12:04 PM

## 2023-11-26 ENCOUNTER — Ambulatory Visit: Payer: Medicare Other | Attending: Internal Medicine

## 2023-11-26 DIAGNOSIS — R2689 Other abnormalities of gait and mobility: Secondary | ICD-10-CM | POA: Diagnosis present

## 2023-11-26 DIAGNOSIS — R278 Other lack of coordination: Secondary | ICD-10-CM | POA: Diagnosis present

## 2023-11-26 DIAGNOSIS — R262 Difficulty in walking, not elsewhere classified: Secondary | ICD-10-CM | POA: Insufficient documentation

## 2023-11-26 DIAGNOSIS — R2681 Unsteadiness on feet: Secondary | ICD-10-CM | POA: Diagnosis present

## 2023-11-26 DIAGNOSIS — R269 Unspecified abnormalities of gait and mobility: Secondary | ICD-10-CM | POA: Insufficient documentation

## 2023-11-26 DIAGNOSIS — M6281 Muscle weakness (generalized): Secondary | ICD-10-CM | POA: Diagnosis present

## 2023-12-01 ENCOUNTER — Ambulatory Visit: Payer: Medicare Other

## 2023-12-02 NOTE — Therapy (Incomplete)
 OUTPATIENT PHYSICAL THERAPY TREATMENT  Patient Name: Gregory Crane MRN: [id number] DOB:[birth year], 53 y.o., male Today's Date: 12/02/2023   PCP: Dale [place] MD REFERRING PROVIDER: Dale Villarreal MD  END OF SESSION:            Past Medical History:  Diagnosis Date   Allergy    Anemia    Bell's palsy    Diabetes mellitus without complication (HCC)    diet controlled   Fall 07/2023   Hypertension    Hypothyroidism    Kidney stones    Pseudotumor cerebri    Stroke [facility])    Past Surgical History:  Procedure Laterality Date   COLONOSCOPY WITH PROPOFOL N/A 03/30/2020   Procedure: COLONOSCOPY WITH PROPOFOL;  Surgeon: Regis Bill, MD;  Location: [facility] ENDOSCOPY;  Service: Endoscopy;  Laterality: N/A;   LOOP RECORDER INSERTION N/A 01/27/2018   Procedure: LOOP RECORDER INSERTION;  Surgeon: Duke Salvia, MD;  Location: [facility] INVASIVE CV LAB;  Service: Cardiovascular;  Laterality: N/A;   LUMBAR PUNCTURE     as child   NO PAST SURGERIES     REMOVAL OF A DIALYSIS CATHETER     TEE WITHOUT CARDIOVERSION N/A 01/07/2018   Procedure: TRANSESOPHAGEAL ECHOCARDIOGRAM (TEE);  Surgeon: Antonieta Iba, MD;  Location: [facility] ORS;  Service: Cardiovascular;  Laterality: N/A;   Patient Active Problem List   Diagnosis Date Noted   Atrial fibrillation (HCC) 08/10/2023   Atrial fibrillation with rapid ventricular response (HCC) 07/03/2023   Atrial fibrillation with RVR (HCC) 07/02/2023   NSTEMI (non-ST elevated myocardial infarction) (HCC) 07/02/2023   Fall 06/15/2023   Non-compliance with renal dialysis (HCC) 05/02/2023   Obesity (BMI 30-39.9) 04/30/2023   Volume overload 04/29/2023   Renal osteodystrophy 11/25/2022   Unsteady gait 10/05/2022   Steal syndrome of dialysis vascular access (HCC) 05/28/2022   Hydronephrosis, left 05/04/2022   Leukocytosis 05/04/2022   Pre-op evaluation 04/24/2022   Chest pain 01/13/2022   Diabetic retinopathy associated with diabetes  mellitus due to underlying condition (HCC) 01/13/2022   ESRD on hemodialysis (HCC) 01/13/2022   Deafness in right ear 08/11/2021   Open wound 01/25/2021   History of colon polyps 10/15/2020   Postoperative hemorrhage involving digestive system following digestive system procedure 09/19/2020   Acute cholecystitis without calculus 09/09/2020   Type 2 diabetes mellitus, with long-term current use of insulin (HCC) 09/09/2020   Cryptogenic stroke (HCC) 07/13/2020   History of loop recorder 07/13/2020   History of 2019 novel coronavirus disease (COVID-19) 05/20/2020   Pneumonia due to COVID-19 virus 04/03/2020   Elevated troponin 04/03/2020   Acquired trigger finger 06/08/2019   Lymphedema 06/08/2019   Anemia 04/17/2019   Swelling of both lower extremities 01/10/2019   Facial droop 04/09/2018   Daytime somnolence 03/30/2018   Carotid artery disease (HCC) 02/03/2018   Intracranial vascular stenosis 09/12/2017   Cough 01/20/2017   Bell's palsy 11/10/2016   History of CVA (cerebrovascular accident) 11/10/2016   Benign localized hyperplasia of prostate with urinary obstruction 10/27/2016   History of nephrolithiasis 10/27/2016   TIA (transient ischemic attack) 10/20/2016   Near syncope 06/23/2016   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   Neuropathy 08/06/2015   Health care maintenance 08/06/2015   Essential hypertension 08/06/2015   Heme positive stool 10/10/2013   Hypothyroidism 10/10/2013   Microalbuminuria 10/10/2013   Hyperlipidemia 10/10/2013   B12 deficiency 10/10/2013   Environmental allergies 07/04/2013    ONSET DATE: 3 years ago  REFERRING DIAG: CVA  THERAPY DIAG:  No diagnosis  found.  Rationale for Evaluation and Treatment: Rehabilitation  SUBJECTIVE:                                                                                                                                                                                             SUBJECTIVE STATEMENT:  ***   Pt  accompanied by: self  PERTINENT HISTORY: Patient returning to PT after hospitalization after being off HD for past 3 months with end stage ESRD. PMH DM, HTN, Anemia, Bells palsy, Hypothyroidism, Kidney stones, stroke and pseudotumour cerebri. PAIN:  Are you having pain? No  PRECAUTIONS: Fall  RED FLAGS: None   WEIGHT BEARING RESTRICTIONS: No  FALLS: Has patient fallen in last 6 months? No  PATIENT GOALS: to get LLE stronger and more steady  OBJECTIVE:   TODAY'S TREATMENT: DATE: 12/02/23  TA  STS:15x; 2sets Some decreased eccentric control with fatigue.   LAQ 12x each LE   6" step: -alternate tapping 10x each LE -step up/down 10x each LE  BTB PF press 10x 2 sets   Standing heel raises 20x   Seated BTB hamstring curl 15x each LE - rates "a solid medium"    Neuro Re-ed: Standing with CGA next to support surface:  Airex pad: static stand 30 seconds Airex pad: horizontal head turns 10x each way Airex pad: vertical head turns 10x each way - intermittent UE support     Pt required occasional rest breaks due fatigue, PT was attentive to when pt appeared to be tired or winded in order to prevent excessive fatigue.   PATIENT EDUCATION: Education Journalist, newspaper  Person educated: Patient Education method: Explanation, Demonstration, Tactile cues, and Verbal cues Education comprehension: verbalized understanding, returned demonstration, verbal cues required, and tactile cues required  HOME EXERCISE PROGRAM: Access Code: [facility] URL: [email or web address] Date: 06/17/2023 Prepared by: Maureen Ralphs  Exercises - Standing Balance in Corner with Eyes Closed  - 1 x daily - 2-3 x weekly - 3 sets - up to 30 sec hold - Standing Balance in Corner  - 1 x daily - 2-3 x weekly - 3 sets - 30 sec hold - Corner Balance Feet Apart: Eyes Open With Head Turns  - 1 x daily - 2-3 x weekly - 3 sets - 10 reps - Sit to Stand with Arms Crossed  - 1 x daily  - 3 x weekly - 3 sets - 10 reps GOALS: Goals reviewed with patient? Yes  SHORT TERM GOALS: Target date: 06/17/2023   Patient will be independent in home exercise program to improve strength/mobility for better functional independence with ADLs. Baseline: 11/19: compliant  Goal status:  MET   LONG TERM GOALS: Target date: 02/04/2024  Patient will increase FOTO score to equal to or greater than   72%  to demonstrate statistically significant improvement in mobility and quality of life.  Baseline: 9/24: 65% 11/19: 60%  Goal status: deferred  2.   Patient (< 37 years old) will complete five times sit to stand test in < 10 seconds indicating an increased LE strength and improved balance. Baseline: 9/24: 20.23 seconds no hands, one near LOB  11/19:15.02 seconds no hands 09/22/23: 17.35 secs no hands 3/19: 14 seconds no hands ; one LOB  Goal status: Partially Met  3.   Patient will increase Berg Balance score by >45/56 to demonstrate decreased fall risk during functional activities.  Baseline: 39  11/19: 44 3/19: 45 Goal status: Partially Met   4.    Patient will increase six minute walk test distance to >1400 for progression to age norm community ambulator and improve gait ability Baseline: 9/24: 1060  11/19: 1070 ft  09/22/23: 964 ft 3/19: interrupted by fire drill.  Goal status: In Progress   5.    Patient will be independent with ascend/descend 12 steps using single UE in step over step pattern without LOB.  Baseline:11/19: sometimes has to use hands and feet like a ladder to get up the stairs  09/22/23: Pt utilized R UE for all 12 steps, required BUE's for 2 instances of LOB stepping down on the R LE; assistance provided by therapist to remain upright 3/19: BUE support for descending  Goal status: Ongoing  6.   Patient will increase lower extremity functional scale to >60/80 to demonstrate improved functional mobility and increased tolerance with ADLs.  Baseline: 3/19: 40 Goal  status: INITIAL   ASSESSMENT:  CLINICAL IMPRESSION: ***   Pt will continue to benefit from skilled physical therapy intervention to address impairments, improve QOL, and attain therapy goals.       OBJECTIVE IMPAIRMENTS: Abnormal gait, cardiopulmonary status limiting activity, decreased activity tolerance, decreased balance, decreased coordination, decreased mobility, difficulty walking, decreased strength, impaired flexibility, impaired sensation, impaired vision/preception, improper body mechanics, and postural dysfunction.   ACTIVITY LIMITATIONS: carrying, lifting, bending, sitting, standing, squatting, stairs, transfers, bed mobility, dressing, reach over head, hygiene/grooming, locomotion level, and caring for others  PARTICIPATION LIMITATIONS: meal prep, cleaning, laundry, personal finances, driving, shopping, community activity, occupation, and yard work  PERSONAL FACTORS: Age, Fitness, Past/current experiences, Time since onset of injury/illness/exacerbation, and 3+ comorbidities: DM, HTN, Anemia, Bells palsy, Hypothyroidism, Kidney stones, stroke and pseudotumour cerebri  are also affecting patient's functional outcome.   REHAB POTENTIAL: Good  CLINICAL DECISION MAKING: Evolving/moderate complexity  EVALUATION COMPLEXITY: Moderate  PLAN:  PT FREQUENCY: 2x/week  PT DURATION: 8 weeks  PLANNED INTERVENTIONS: Therapeutic exercises, Therapeutic activity, Neuromuscular re-education, Balance training, Gait training, Patient/Family education, Self Care, Joint mobilization, Stair training, Vestibular training, Canalith repositioning, Visual/preceptual remediation/compensation, Orthotic/Fit training, DME instructions, Dry Needling, Spinal mobilization, Cryotherapy, Moist heat, Splintting, Taping, Traction, Manual therapy, and Re-evaluation  PLAN FOR NEXT SESSION:  hip flexor strength and ankle strength, balance, resisted multiplanar walking, L LE high level dynamic weight  shifting    Precious Bard PT ,DPT Physical Therapist- [facility]  [facility]    12/02/23, 1:06 PM

## 2023-12-03 ENCOUNTER — Ambulatory Visit: Payer: Medicare Other | Admitting: Physical Therapy

## 2023-12-03 DIAGNOSIS — R262 Difficulty in walking, not elsewhere classified: Secondary | ICD-10-CM

## 2023-12-03 DIAGNOSIS — R2681 Unsteadiness on feet: Secondary | ICD-10-CM

## 2023-12-03 DIAGNOSIS — R269 Unspecified abnormalities of gait and mobility: Secondary | ICD-10-CM

## 2023-12-03 DIAGNOSIS — M6281 Muscle weakness (generalized): Secondary | ICD-10-CM

## 2023-12-03 DIAGNOSIS — R2689 Other abnormalities of gait and mobility: Secondary | ICD-10-CM

## 2023-12-03 NOTE — Therapy (Signed)
 OUTPATIENT PHYSICAL THERAPY TREATMENT  Patient Name: Gregory Crane MRN: [id number] DOB:1971-04-18, 53 y.o., male Today's Date: 12/03/2023   PCP: Dale Ursina MD REFERRING PROVIDER: Dale Newport MD  END OF SESSION:  PT End of Session - 12/03/23 1025     Visit Number 26    Number of Visits 45    Date for PT Re-Evaluation 02/04/24    Authorization Type Medicare (trad)    Authorization Time Period 09/21/22-11/17/23    Progress Note Due on Visit 30    PT Start Time 1017    PT Stop Time 1057    PT Time Calculation (min) 40 min    Equipment Utilized During Treatment Gait belt    Activity Tolerance Patient tolerated treatment well;No increased pain;Patient limited by fatigue    Behavior During Therapy [facility] for tasks assessed/performed                      Past Medical History:  Diagnosis Date   Allergy    Anemia    Bell's palsy    Diabetes mellitus without complication (HCC)    diet controlled   Fall 07/2023   Hypertension    Hypothyroidism    Kidney stones    Pseudotumor cerebri    Stroke [facility])    Past Surgical History:  Procedure Laterality Date   COLONOSCOPY WITH PROPOFOL N/A 03/30/2020   Procedure: COLONOSCOPY WITH PROPOFOL;  Surgeon: Regis Bill, MD;  Location: [facility] ENDOSCOPY;  Service: Endoscopy;  Laterality: N/A;   LOOP RECORDER INSERTION N/A 01/27/2018   Procedure: LOOP RECORDER INSERTION;  Surgeon: Duke Salvia, MD;  Location: [facility] INVASIVE CV LAB;  Service: Cardiovascular;  Laterality: N/A;   LUMBAR PUNCTURE     as child   NO PAST SURGERIES     REMOVAL OF A DIALYSIS CATHETER     TEE WITHOUT CARDIOVERSION N/A 01/07/2018   Procedure: TRANSESOPHAGEAL ECHOCARDIOGRAM (TEE);  Surgeon: Antonieta Iba, MD;  Location: [facility] ORS;  Service: Cardiovascular;  Laterality: N/A;   Patient Active Problem List   Diagnosis Date Noted   Atrial fibrillation (HCC) 08/10/2023   Atrial fibrillation with rapid ventricular response (HCC) 07/03/2023    Atrial fibrillation with RVR (HCC) 07/02/2023   NSTEMI (non-ST elevated myocardial infarction) (HCC) 07/02/2023   Fall 06/15/2023   Non-compliance with renal dialysis (HCC) 05/02/2023   Obesity (BMI 30-39.9) 04/30/2023   Volume overload 04/29/2023   Renal osteodystrophy 11/25/2022   Unsteady gait 10/05/2022   Steal syndrome of dialysis vascular access (HCC) 05/28/2022   Hydronephrosis, left 05/04/2022   Leukocytosis 05/04/2022   Pre-op evaluation 04/24/2022   Chest pain 01/13/2022   Diabetic retinopathy associated with diabetes mellitus due to underlying condition (HCC) 01/13/2022   ESRD on hemodialysis (HCC) 01/13/2022   Deafness in right ear 08/11/2021   Open wound 01/25/2021   History of colon polyps 10/15/2020   Postoperative hemorrhage involving digestive system following digestive system procedure 09/19/2020   Acute cholecystitis without calculus 09/09/2020   Type 2 diabetes mellitus, with long-term current use of insulin (HCC) 09/09/2020   Cryptogenic stroke (HCC) 07/13/2020   History of loop recorder 07/13/2020   History of 2019 novel coronavirus disease (COVID-19) 05/20/2020   Pneumonia due to COVID-19 virus 04/03/2020   Elevated troponin 04/03/2020   Acquired trigger finger 06/08/2019   Lymphedema 06/08/2019   Anemia 04/17/2019   Swelling of both lower extremities 01/10/2019   Facial droop 04/09/2018   Daytime somnolence 03/30/2018   Carotid artery disease (HCC)  02/03/2018   Intracranial vascular stenosis 09/12/2017   Cough 01/20/2017   Bell's palsy 11/10/2016   History of CVA (cerebrovascular accident) 11/10/2016   Benign localized hyperplasia of prostate with urinary obstruction 10/27/2016   History of nephrolithiasis 10/27/2016   TIA (transient ischemic attack) 10/20/2016   Near syncope 06/23/2016   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   Neuropathy 08/06/2015   Health care maintenance 08/06/2015   Essential hypertension 08/06/2015   Heme positive stool 10/10/2013    Hypothyroidism 10/10/2013   Microalbuminuria 10/10/2013   Hyperlipidemia 10/10/2013   B12 deficiency 10/10/2013   Environmental allergies 07/04/2013    ONSET DATE: 3 years ago  REFERRING DIAG: CVA  THERAPY DIAG:  Muscle weakness (generalized)  Unsteadiness on feet  Difficulty in walking, not elsewhere classified  Abnormality of gait and mobility  Other abnormalities of gait and mobility  Rationale for Evaluation and Treatment: Rehabilitation  SUBJECTIVE:                                                                                                                                                                                             SUBJECTIVE STATEMENT:  Pt reports he is doing okay. No falls since last session.    Pt accompanied by: self  PERTINENT HISTORY: Patient returning to PT after hospitalization after being off HD for past 3 months with end stage ESRD. PMH DM, HTN, Anemia, Bells palsy, Hypothyroidism, Kidney stones, stroke and pseudotumour cerebri. PAIN:  Are you having pain? No  PRECAUTIONS: Fall  RED FLAGS: None   WEIGHT BEARING RESTRICTIONS: No  FALLS: Has patient fallen in last 6 months? No  PATIENT GOALS: to get LLE stronger and more steady  OBJECTIVE:   TODAY'S TREATMENT: DATE: 12/03/23  TE  LAQ 10x each LE with 3# AW   Seated BTB hamstring curl 2*10 Blue TB ea LE   TA  Standing march x 10 ea with 3# AW   Standing open and close gait with 3# AW 2x5 ea side   STS:15x; 2sets Some decreased eccentric control with fatigue. One lateral LOB min A to maintain balance. No UE support   6" step: -alternate tapping 10x each LE no UE support   BTB PF press 10x 2 sets   Standing heel raises 20x 2 sets  Pt required occasional rest breaks due fatigue, PT was attentive to when pt appeared to be tired or winded in order to prevent excessive fatigue.   PATIENT EDUCATION: Education details:exercise technique  Person educated:  Patient Education method: Explanation, Demonstration, Tactile cues, and Verbal cues Education comprehension: verbalized understanding, returned demonstration, verbal cues required, and tactile cues required  HOME EXERCISE PROGRAM: Access Code: [facility] URL: [email or web address] Date: 06/17/2023 Prepared by: Maureen Ralphs  Exercises - Standing Balance in Corner with Eyes Closed  - 1 x daily - 2-3 x weekly - 3 sets - up to 30 sec hold - Standing Balance in Corner  - 1 x daily - 2-3 x weekly - 3 sets - 30 sec hold - Corner Balance Feet Apart: Eyes Open With Head Turns  - 1 x daily - 2-3 x weekly - 3 sets - 10 reps - Sit to Stand with Arms Crossed  - 1 x daily - 3 x weekly - 3 sets - 10 reps GOALS: Goals reviewed with patient? Yes  SHORT TERM GOALS: Target date: 06/17/2023   Patient will be independent in home exercise program to improve strength/mobility for better functional independence with ADLs. Baseline: 11/19: compliant  Goal status: MET   LONG TERM GOALS: Target date: 02/04/2024  Patient will increase FOTO score to equal to or greater than   72%  to demonstrate statistically significant improvement in mobility and quality of life.  Baseline: 9/24: 65% 11/19: 60%  Goal status: deferred  2.   Patient (< 72 years old) will complete five times sit to stand test in < 10 seconds indicating an increased LE strength and improved balance. Baseline: 9/24: 20.23 seconds no hands, one near LOB  11/19:15.02 seconds no hands 09/22/23: 17.35 secs no hands 3/19: 14 seconds no hands ; one LOB  Goal status: Partially Met  3.   Patient will increase Berg Balance score by >45/56 to demonstrate decreased fall risk during functional activities.  Baseline: 39  11/19: 44 3/19: 45 Goal status: Partially Met   4.    Patient will increase six minute walk test distance to >1400 for progression to age norm community ambulator and improve gait ability Baseline: 9/24: 1060   11/19: 1070 ft  09/22/23: 964 ft 3/19: interrupted by fire drill.  Goal status: In Progress   5.    Patient will be independent with ascend/descend 12 steps using single UE in step over step pattern without LOB.  Baseline:11/19: sometimes has to use hands and feet like a ladder to get up the stairs  09/22/23: Pt utilized R UE for all 12 steps, required BUE's for 2 instances of LOB stepping down on the R LE; assistance provided by therapist to remain upright 3/19: BUE support for descending  Goal status: Ongoing  6.   Patient will increase lower extremity functional scale to >60/80 to demonstrate improved functional mobility and increased tolerance with ADLs.  Baseline: 3/19: 40 Goal status: INITIAL   ASSESSMENT:  CLINICAL IMPRESSION: Patient presents with good motivation for completion of physical therapy activities.  Patient challenged with multiple hip and lower extremity strengthening activities primarily focused on functional activities when possible.  Patient reports getting his new ankle brace soon and he is not excited but he is encouraged of the benefits it would likely provide him for his mobility and safety.   Pt will continue to benefit from skilled physical therapy intervention to address impairments, improve QOL, and attain therapy goals.       OBJECTIVE IMPAIRMENTS: Abnormal gait, cardiopulmonary status limiting activity, decreased activity tolerance, decreased balance, decreased coordination, decreased mobility, difficulty walking, decreased strength, impaired flexibility, impaired sensation, impaired vision/preception, improper body mechanics, and postural dysfunction.   ACTIVITY LIMITATIONS: carrying, lifting, bending, sitting, standing, squatting, stairs, transfers, bed mobility, dressing, reach over head, hygiene/grooming, locomotion level, and caring for others  PARTICIPATION LIMITATIONS:  meal prep, cleaning, laundry, personal finances, driving, shopping, community  activity, occupation, and yard work  PERSONAL FACTORS: Age, Fitness, Past/current experiences, Time since onset of injury/illness/exacerbation, and 3+ comorbidities: DM, HTN, Anemia, Bells palsy, Hypothyroidism, Kidney stones, stroke and pseudotumour cerebri  are also affecting patient's functional outcome.   REHAB POTENTIAL: Good  CLINICAL DECISION MAKING: Evolving/moderate complexity  EVALUATION COMPLEXITY: Moderate  PLAN:  PT FREQUENCY: 2x/week  PT DURATION: 8 weeks  PLANNED INTERVENTIONS: Therapeutic exercises, Therapeutic activity, Neuromuscular re-education, Balance training, Gait training, Patient/Family education, Self Care, Joint mobilization, Stair training, Vestibular training, Canalith repositioning, Visual/preceptual remediation/compensation, Orthotic/Fit training, DME instructions, Dry Needling, Spinal mobilization, Cryotherapy, Moist heat, Splintting, Taping, Traction, Manual therapy, and Re-evaluation  PLAN FOR NEXT SESSION:  hip flexor strength and ankle strength, balance, resisted multiplanar walking, L LE high level dynamic weight shifting    Norman Herrlich PT ,DPT Physical Therapist- [place]  [facility]    12/03/23, 10:25 AM

## 2023-12-04 NOTE — Therapy (Signed)
 OUTPATIENT PHYSICAL THERAPY TREATMENT  Patient Name: Gregory Crane MRN: [id number] DOB:[birth year], 53 y.o., male Today's Date: 12/08/2023   PCP: Dellar Fenton MD REFERRING PROVIDER: Dellar Fenton MD  END OF SESSION:  PT End of Session - 12/08/23 0945     Visit Number 27    Number of Visits 45    Date for PT Re-Evaluation 02/04/24    Authorization Type Medicare (trad)    Authorization Time Period 09/21/22-11/17/23    Progress Note Due on Visit 30    PT Start Time 0945    PT Stop Time 1014    PT Time Calculation (min) 29 min    Equipment Utilized During Treatment Gait belt    Activity Tolerance Patient tolerated treatment well;No increased pain;Patient limited by fatigue    Behavior During Therapy [facility] for tasks assessed/performed                       Past Medical History:  Diagnosis Date   Allergy    Anemia    Bell's palsy    Diabetes mellitus without complication (HCC)    diet controlled   Fall 07/2023   Hypertension    Hypothyroidism    Kidney stones    Pseudotumor cerebri    Stroke [facility])    Past Surgical History:  Procedure Laterality Date   COLONOSCOPY WITH PROPOFOL N/A 03/30/2020   Procedure: COLONOSCOPY WITH PROPOFOL;  Surgeon: Shane Darling, MD;  Location: [facility] ENDOSCOPY;  Service: Endoscopy;  Laterality: N/A;   LOOP RECORDER INSERTION N/A 01/27/2018   Procedure: LOOP RECORDER INSERTION;  Surgeon: Verona Goodwill, MD;  Location: [facility] INVASIVE CV LAB;  Service: Cardiovascular;  Laterality: N/A;   LUMBAR PUNCTURE     as child   NO PAST SURGERIES     REMOVAL OF A DIALYSIS CATHETER     TEE WITHOUT CARDIOVERSION N/A 01/07/2018   Procedure: TRANSESOPHAGEAL ECHOCARDIOGRAM (TEE);  Surgeon: Devorah Fonder, MD;  Location: [facility] ORS;  Service: Cardiovascular;  Laterality: N/A;   Patient Active Problem List   Diagnosis Date Noted   Atrial fibrillation (HCC) 08/10/2023   Atrial fibrillation with rapid ventricular response (HCC) 07/03/2023    Atrial fibrillation with RVR (HCC) 07/02/2023   NSTEMI (non-ST elevated myocardial infarction) (HCC) 07/02/2023   Fall 06/15/2023   Non-compliance with renal dialysis (HCC) 05/02/2023   Obesity (BMI 30-39.9) 04/30/2023   Volume overload 04/29/2023   Renal osteodystrophy 11/25/2022   Unsteady gait 10/05/2022   Steal syndrome of dialysis vascular access (HCC) 05/28/2022   Hydronephrosis, left 05/04/2022   Leukocytosis 05/04/2022   Pre-op evaluation 04/24/2022   Chest pain 01/13/2022   Diabetic retinopathy associated with diabetes mellitus due to underlying condition (HCC) 01/13/2022   ESRD on hemodialysis (HCC) 01/13/2022   Deafness in right ear 08/11/2021   Open wound 01/25/2021   History of colon polyps 10/15/2020   Postoperative hemorrhage involving digestive system following digestive system procedure 09/19/2020   Acute cholecystitis without calculus 09/09/2020   Type 2 diabetes mellitus, with long-term current use of insulin (HCC) 09/09/2020   Cryptogenic stroke (HCC) 07/13/2020   History of loop recorder 07/13/2020   History of 2019 novel coronavirus disease (COVID-19) 05/20/2020   Pneumonia due to COVID-19 virus 04/03/2020   Elevated troponin 04/03/2020   Acquired trigger finger 06/08/2019   Lymphedema 06/08/2019   Anemia 04/17/2019   Swelling of both lower extremities 01/10/2019   Facial droop 04/09/2018   Daytime somnolence 03/30/2018   Carotid artery disease (  HCC) 02/03/2018   Intracranial vascular stenosis 09/12/2017   Cough 01/20/2017   Bell's palsy 11/10/2016   History of CVA (cerebrovascular accident) 11/10/2016   Benign localized hyperplasia of prostate with urinary obstruction 10/27/2016   History of nephrolithiasis 10/27/2016   TIA (transient ischemic attack) 10/20/2016   Near syncope 06/23/2016   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   Neuropathy 08/06/2015   Health care maintenance 08/06/2015   Essential hypertension 08/06/2015   Heme positive stool 10/10/2013    Hypothyroidism 10/10/2013   Microalbuminuria 10/10/2013   Hyperlipidemia 10/10/2013   B12 deficiency 10/10/2013   Environmental allergies 07/04/2013    ONSET DATE: 3 years ago  REFERRING DIAG: CVA  THERAPY DIAG:  Muscle weakness (generalized)  Unsteadiness on feet  Difficulty in walking, not elsewhere classified  Rationale for Evaluation and Treatment: Rehabilitation  SUBJECTIVE:                                                                                                                                                                                             SUBJECTIVE STATEMENT:  Patient arrived late due to mixing up times. Is wearing his new brace on his L ankle.    Pt accompanied by: self  PERTINENT HISTORY: Patient returning to PT after hospitalization after being off HD for past 3 months with end stage ESRD. PMH DM, HTN, Anemia, Bells palsy, Hypothyroidism, Kidney stones, stroke and pseudotumour cerebri. PAIN:  Are you having pain? No  PRECAUTIONS: Fall  RED FLAGS: None   WEIGHT BEARING RESTRICTIONS: No  FALLS: Has patient fallen in last 6 months? No  PATIENT GOALS: to get LLE stronger and more steady  OBJECTIVE:   TODAY'S TREATMENT: DATE: 12/08/23  Neuro Re-ed: Standing with CGA next to support surface:  Airex pad: static stand 30 seconds x 2 trials, noticeable trembling of ankles/LE's with fatigue and challenge to maintain stability Airex pad: horizontal head turns 30 seconds scanning room 10x ; cueing for arc of motion  Airex pad: vertical head turns 30 seconds, cueing for arc of motion, noticeable sway with upward gaze increasing demand on ankle righting reaction musculature Airex pad: one foot on 6" step one foot on airex pad, hold position for 30 seconds, switch legs, 2x each LE;   TA Ambulate 150 ft with new Afo to assess foot collapse  STS:15x;  Some decreased eccentric control with fatigue. One lateral LOB min A to maintain balance.  No UE support   6" step: -alternate tapping 10x each LE no UE support  -lateral tap 10x each LE     Pt required occasional rest breaks due fatigue, PT was attentive to  when pt appeared to be tired or winded in order to prevent excessive fatigue.   PATIENT EDUCATION: Education Journalist, newspaper  Person educated: Patient Education method: Explanation, Demonstration, Tactile cues, and Verbal cues Education comprehension: verbalized understanding, returned demonstration, verbal cues required, and tactile cues required  HOME EXERCISE PROGRAM: Access Code: [facility] URL: [email or web address] Date: 06/17/2023 Prepared by: Ferrell Hu  Exercises - Standing Balance in Corner with Eyes Closed  - 1 x daily - 2-3 x weekly - 3 sets - up to 30 sec hold - Standing Balance in Corner  - 1 x daily - 2-3 x weekly - 3 sets - 30 sec hold - Corner Balance Feet Apart: Eyes Open With Head Turns  - 1 x daily - 2-3 x weekly - 3 sets - 10 reps - Sit to Stand with Arms Crossed  - 1 x daily - 3 x weekly - 3 sets - 10 reps GOALS: Goals reviewed with patient? Yes  SHORT TERM GOALS: Target date: 06/17/2023   Patient will be independent in home exercise program to improve strength/mobility for better functional independence with ADLs. Baseline: 11/19: compliant  Goal status: MET   LONG TERM GOALS: Target date: 02/04/2024  Patient will increase FOTO score to equal to or greater than   72%  to demonstrate statistically significant improvement in mobility and quality of life.  Baseline: 9/24: 65% 11/19: 60%  Goal status: deferred  2.   Patient (< 54 years old) will complete five times sit to stand test in < 10 seconds indicating an increased LE strength and improved balance. Baseline: 9/24: 20.23 seconds no hands, one near LOB  11/19:15.02 seconds no hands 09/22/23: 17.35 secs no hands 3/19: 14 seconds no hands ; one LOB  Goal status: Partially Met  3.   Patient will  increase Berg Balance score by >45/56 to demonstrate decreased fall risk during functional activities.  Baseline: 39  11/19: 44 3/19: 45 Goal status: Partially Met   4.    Patient will increase six minute walk test distance to >1400 for progression to age norm community ambulator and improve gait ability Baseline: 9/24: 1060  11/19: 1070 ft  09/22/23: 964 ft 3/19: interrupted by fire drill.  Goal status: In Progress   5.    Patient will be independent with ascend/descend 12 steps using single UE in step over step pattern without LOB.  Baseline:11/19: sometimes has to use hands and feet like a ladder to get up the stairs  09/22/23: Pt utilized R UE for all 12 steps, required BUE's for 2 instances of LOB stepping down on the R LE; assistance provided by therapist to remain upright 3/19: BUE support for descending  Goal status: Ongoing  6.   Patient will increase lower extremity functional scale to >60/80 to demonstrate improved functional mobility and increased tolerance with ADLs.  Baseline: 3/19: 40 Goal status: INITIAL   ASSESSMENT:  CLINICAL IMPRESSION:  Patient is wearing his new L AFO. He has decreased L foot collapse and eversion. Session limited by late arrival.  Pt will continue to benefit from skilled physical therapy intervention to address impairments, improve QOL, and attain therapy goals.       OBJECTIVE IMPAIRMENTS: Abnormal gait, cardiopulmonary status limiting activity, decreased activity tolerance, decreased balance, decreased coordination, decreased mobility, difficulty walking, decreased strength, impaired flexibility, impaired sensation, impaired vision/preception, improper body mechanics, and postural dysfunction.   ACTIVITY LIMITATIONS: carrying, lifting, bending, sitting, standing, squatting, stairs, transfers, bed mobility, dressing, reach over head, hygiene/grooming, locomotion  level, and caring for others  PARTICIPATION LIMITATIONS: meal prep, cleaning,  laundry, personal finances, driving, shopping, community activity, occupation, and yard work  PERSONAL FACTORS: Age, Fitness, Past/current experiences, Time since onset of injury/illness/exacerbation, and 3+ comorbidities: DM, HTN, Anemia, Bells palsy, Hypothyroidism, Kidney stones, stroke and pseudotumour cerebri  are also affecting patient's functional outcome.   REHAB POTENTIAL: Good  CLINICAL DECISION MAKING: Evolving/moderate complexity  EVALUATION COMPLEXITY: Moderate  PLAN:  PT FREQUENCY: 2x/week  PT DURATION: 8 weeks  PLANNED INTERVENTIONS: Therapeutic exercises, Therapeutic activity, Neuromuscular re-education, Balance training, Gait training, Patient/Family education, Self Care, Joint mobilization, Stair training, Vestibular training, Canalith repositioning, Visual/preceptual remediation/compensation, Orthotic/Fit training, DME instructions, Dry Needling, Spinal mobilization, Cryotherapy, Moist heat, Splintting, Taping, Traction, Manual therapy, and Re-evaluation  PLAN FOR NEXT SESSION:  hip flexor strength and ankle strength, balance, resisted multiplanar walking, L LE high level dynamic weight shifting    Ranon Coven  Brain Cahill PT ,DPT Physical Therapist- [place]  [facility]    12/08/23, 10:14 AM

## 2023-12-08 ENCOUNTER — Telehealth: Payer: Self-pay

## 2023-12-08 ENCOUNTER — Ambulatory Visit: Payer: Medicare Other

## 2023-12-08 DIAGNOSIS — R2681 Unsteadiness on feet: Secondary | ICD-10-CM

## 2023-12-08 DIAGNOSIS — M6281 Muscle weakness (generalized): Secondary | ICD-10-CM

## 2023-12-08 DIAGNOSIS — R262 Difficulty in walking, not elsewhere classified: Secondary | ICD-10-CM

## 2023-12-08 NOTE — Telephone Encounter (Signed)
 Opened in error

## 2023-12-09 NOTE — Therapy (Incomplete)
 OUTPATIENT PHYSICAL THERAPY TREATMENT  Patient Name: Gregory Crane MRN: [id number] DOB:Aug 31, 1970, 53 y.o., male Today's Date: 12/09/2023   PCP: Dale [place] MD REFERRING PROVIDER: Dale [place] MD  END OF SESSION:              Past Medical History:  Diagnosis Date   Allergy    Anemia    Bell's palsy    Diabetes mellitus without complication (HCC)    diet controlled   Fall 07/2023   Hypertension    Hypothyroidism    Kidney stones    Pseudotumor cerebri    Stroke [facility])    Past Surgical History:  Procedure Laterality Date   COLONOSCOPY WITH PROPOFOL N/A 03/30/2020   Procedure: COLONOSCOPY WITH PROPOFOL;  Surgeon: Regis Bill, MD;  Location: [facility] ENDOSCOPY;  Service: Endoscopy;  Laterality: N/A;   LOOP RECORDER INSERTION N/A 01/27/2018   Procedure: LOOP RECORDER INSERTION;  Surgeon: Duke Salvia, MD;  Location: [facility] INVASIVE CV LAB;  Service: Cardiovascular;  Laterality: N/A;   LUMBAR PUNCTURE     as child   NO PAST SURGERIES     REMOVAL OF A DIALYSIS CATHETER     TEE WITHOUT CARDIOVERSION N/A 01/07/2018   Procedure: TRANSESOPHAGEAL ECHOCARDIOGRAM (TEE);  Surgeon: Antonieta Iba, MD;  Location: [facility] ORS;  Service: Cardiovascular;  Laterality: N/A;   Patient Active Problem List   Diagnosis Date Noted   Atrial fibrillation (HCC) 08/10/2023   Atrial fibrillation with rapid ventricular response (HCC) 07/03/2023   Atrial fibrillation with RVR (HCC) 07/02/2023   NSTEMI (non-ST elevated myocardial infarction) (HCC) 07/02/2023   Fall 06/15/2023   Non-compliance with renal dialysis (HCC) 05/02/2023   Obesity (BMI 30-39.9) 04/30/2023   Volume overload 04/29/2023   Renal osteodystrophy 11/25/2022   Unsteady gait 10/05/2022   Steal syndrome of dialysis vascular access (HCC) 05/28/2022   Hydronephrosis, left 05/04/2022   Leukocytosis 05/04/2022   Pre-op evaluation 04/24/2022   Chest pain 01/13/2022   Diabetic retinopathy associated with  diabetes mellitus due to underlying condition (HCC) 01/13/2022   ESRD on hemodialysis (HCC) 01/13/2022   Deafness in right ear 08/11/2021   Open wound 01/25/2021   History of colon polyps 10/15/2020   Postoperative hemorrhage involving digestive system following digestive system procedure 09/19/2020   Acute cholecystitis without calculus 09/09/2020   Type 2 diabetes mellitus, with long-term current use of insulin (HCC) 09/09/2020   Cryptogenic stroke (HCC) 07/13/2020   History of loop recorder 07/13/2020   History of 2019 novel coronavirus disease (COVID-19) 05/20/2020   Pneumonia due to COVID-19 virus 04/03/2020   Elevated troponin 04/03/2020   Acquired trigger finger 06/08/2019   Lymphedema 06/08/2019   Anemia 04/17/2019   Swelling of both lower extremities 01/10/2019   Facial droop 04/09/2018   Daytime somnolence 03/30/2018   Carotid artery disease (HCC) 02/03/2018   Intracranial vascular stenosis 09/12/2017   Cough 01/20/2017   Bell's palsy 11/10/2016   History of CVA (cerebrovascular accident) 11/10/2016   Benign localized hyperplasia of prostate with urinary obstruction 10/27/2016   History of nephrolithiasis 10/27/2016   TIA (transient ischemic attack) 10/20/2016   Near syncope 06/23/2016   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  Rationale for Evaluation and Treatment: Rehabilitation  SUBJECTIVE:                                                                                                                                                                                             SUBJECTIVE  STATEMENT: ***   Pt accompanied by: self  PERTINENT HISTORY: Patient returning to PT after hospitalization after being off HD for past 3 months with end stage ESRD. PMH DM, HTN, Anemia, Bells palsy, Hypothyroidism, Kidney stones, stroke and pseudotumour cerebri. PAIN:  Are you having pain? No  PRECAUTIONS: Fall  RED FLAGS: None   WEIGHT BEARING RESTRICTIONS: No  FALLS: Has patient fallen in last 6 months? No  PATIENT GOALS: to get LLE stronger and more steady  OBJECTIVE:   TODAY'S TREATMENT: DATE: 12/09/23  Neuro Re-ed: Standing with CGA next to support surface:  Airex pad: static stand 30 seconds x 2 trials, noticeable trembling of ankles/LE's with fatigue and challenge to maintain stability Airex pad: horizontal head turns 30 seconds scanning room 10x ; cueing for arc of motion  Airex pad: vertical head turns 30 seconds, cueing for arc of motion, noticeable sway with upward gaze increasing demand on ankle righting reaction musculature Airex pad: one foot on 6" step one foot on airex pad, hold position for 30 seconds, switch legs, 2x each LE;   TA Ambulate 150 ft with new Afo to assess foot collapse  STS:15x;  Some decreased eccentric control with fatigue. One lateral LOB min A to maintain balance. No UE support   6" step: -alternate tapping 10x each LE no UE support  -lateral tap 10x each LE     Pt required occasional rest breaks due fatigue, PT was attentive to when pt appeared to be tired or winded in order to prevent excessive fatigue.   PATIENT EDUCATION: Education Journalist, newspaper  Person educated: Patient Education method: Explanation, Demonstration, Tactile cues, and Verbal cues Education comprehension: verbalized understanding, returned demonstration, verbal cues required, and tactile cues required  HOME EXERCISE PROGRAM: Access Code: [facility] URL: [email or web address] Date: 06/17/2023 Prepared by: Ferrell Hu  Exercises - Standing Balance in Corner with Eyes Closed  - 1 x daily - 2-3 x weekly - 3 sets - up to 30 sec hold - Standing Balance in Corner  - 1 x daily - 2-3 x weekly - 3 sets - 30 sec hold - Corner Balance Feet Apart: Eyes Open With Head Turns  - 1 x daily - 2-3 x weekly - 3 sets - 10 reps - Sit to Stand with Arms Crossed  - 1 x  daily - 3 x weekly - 3 sets - 10 reps GOALS: Goals reviewed with patient? Yes  SHORT TERM GOALS: Target date: 06/17/2023   Patient will be independent in home exercise program to improve strength/mobility for better functional independence with ADLs. Baseline: 11/19: compliant  Goal status: MET   LONG TERM GOALS: Target date: 02/04/2024  Patient will increase FOTO score to equal to or greater than   72%  to demonstrate statistically significant improvement in mobility and quality of life.  Baseline: 9/24: 65% 11/19: 60%  Goal status: deferred  2.   Patient (< 58 years old) will complete five times sit to stand test in < 10 seconds indicating an increased LE strength and improved balance. Baseline: 9/24: 20.23 seconds no hands, one near LOB  11/19:15.02 seconds no hands 09/22/23: 17.35 secs no hands 3/19: 14 seconds no hands ; one LOB  Goal status: Partially Met  3.   Patient will increase Berg Balance score by >45/56 to demonstrate decreased fall risk during functional activities.  Baseline: 39  11/19: 44 3/19: 45 Goal status: Partially Met   4.    Patient will increase six minute walk test distance to >1400 for progression to age norm community ambulator and improve gait ability Baseline: 9/24: 1060  11/19: 1070 ft  09/22/23: 964 ft 3/19: interrupted by fire drill.  Goal status: In Progress   5.    Patient will be independent with ascend/descend 12 steps using single UE in step over step pattern without LOB.  Baseline:11/19: sometimes has to use hands and feet like a ladder to get up the stairs  09/22/23: Pt utilized R UE for all 12  steps, required BUE's for 2 instances of LOB stepping down on the R LE; assistance provided by therapist to remain upright 3/19: BUE support for descending  Goal status: Ongoing  6.   Patient will increase lower extremity functional scale to >60/80 to demonstrate improved functional mobility and increased tolerance with ADLs.  Baseline: 3/19: 40 Goal status: INITIAL   ASSESSMENT:  CLINICAL IMPRESSION: *** Pt will continue to benefit from skilled physical therapy intervention to address impairments, improve QOL, and attain therapy goals.       OBJECTIVE IMPAIRMENTS: Abnormal gait, cardiopulmonary status limiting activity, decreased activity tolerance, decreased balance, decreased coordination, decreased mobility, difficulty walking, decreased strength, impaired flexibility, impaired sensation, impaired vision/preception, improper body mechanics, and postural dysfunction.   ACTIVITY LIMITATIONS: carrying, lifting, bending, sitting, standing, squatting, stairs, transfers, bed mobility, dressing, reach over head, hygiene/grooming, locomotion level, and caring for others  PARTICIPATION LIMITATIONS: meal prep, cleaning, laundry, personal finances, driving, shopping, community activity, occupation, and yard work  PERSONAL FACTORS: Age, Fitness, Past/current experiences, Time since onset of injury/illness/exacerbation, and 3+ comorbidities: DM, HTN, Anemia, Bells palsy, Hypothyroidism, Kidney stones, stroke and pseudotumour cerebri  are also affecting patient's functional outcome.   REHAB POTENTIAL: Good  CLINICAL DECISION MAKING: Evolving/moderate complexity  EVALUATION COMPLEXITY: Moderate  PLAN:  PT FREQUENCY: 2x/week  PT DURATION: 8 weeks  PLANNED INTERVENTIONS: Therapeutic exercises, Therapeutic activity, Neuromuscular re-education, Balance training, Gait training, Patient/Family education, Self Care, Joint mobilization, Stair training, Vestibular training, Canalith repositioning,  Visual/preceptual remediation/compensation, Orthotic/Fit training, DME instructions, Dry Needling, Spinal mobilization, Cryotherapy, Moist heat, Splintting, Taping, Traction, Manual therapy, and Re-evaluation  PLAN FOR NEXT SESSION:  hip flexor strength and ankle strength, balance, resisted multiplanar walking, L LE high level dynamic weight shifting    Precious Bard PT ,DPT Physical Therapist- [place]  [facility]  12/09/23, 5:15 PM

## 2023-12-10 ENCOUNTER — Ambulatory Visit: Payer: Medicare Other | Admitting: Physical Therapy

## 2023-12-10 DIAGNOSIS — R262 Difficulty in walking, not elsewhere classified: Secondary | ICD-10-CM

## 2023-12-10 DIAGNOSIS — R2681 Unsteadiness on feet: Secondary | ICD-10-CM

## 2023-12-10 DIAGNOSIS — R2689 Other abnormalities of gait and mobility: Secondary | ICD-10-CM

## 2023-12-10 DIAGNOSIS — R269 Unspecified abnormalities of gait and mobility: Secondary | ICD-10-CM

## 2023-12-10 DIAGNOSIS — R278 Other lack of coordination: Secondary | ICD-10-CM

## 2023-12-10 DIAGNOSIS — M6281 Muscle weakness (generalized): Secondary | ICD-10-CM | POA: Diagnosis not present

## 2023-12-10 NOTE — Therapy (Signed)
 OUTPATIENT PHYSICAL THERAPY TREATMENT  Patient Name: Gregory Crane MRN: [id number] DOB:[birth year], 53 y.o., male Today's Date: 12/10/2023   PCP: Dellar Fenton MD REFERRING PROVIDER: Dellar Fenton MD  END OF SESSION:   PT End of Session - 12/10/23 1139     Visit Number 28    Number of Visits 45    Date for PT Re-Evaluation 02/04/24    Authorization Type Medicare (trad)    Authorization Time Period 09/21/22-11/17/23    Progress Note Due on Visit 30    PT Start Time 1145    PT Stop Time 1232    PT Time Calculation (min) 47 min    Equipment Utilized During Treatment Gait belt    Activity Tolerance Patient tolerated treatment well;No increased pain;Patient limited by fatigue    Behavior During Therapy [facility] for tasks assessed/performed              Past Medical History:  Diagnosis Date   Allergy    Anemia    Bell's palsy    Diabetes mellitus without complication (HCC)    diet controlled   Fall 07/2023   Hypertension    Hypothyroidism    Kidney stones    Pseudotumor cerebri    Stroke [facility])    Past Surgical History:  Procedure Laterality Date   COLONOSCOPY WITH PROPOFOL N/A 03/30/2020   Procedure: COLONOSCOPY WITH PROPOFOL;  Surgeon: Shane Darling, MD;  Location: [facility] ENDOSCOPY;  Service: Endoscopy;  Laterality: N/A;   LOOP RECORDER INSERTION N/A 01/27/2018   Procedure: LOOP RECORDER INSERTION;  Surgeon: Verona Goodwill, MD;  Location: [facility] INVASIVE CV LAB;  Service: Cardiovascular;  Laterality: N/A;   LUMBAR PUNCTURE     as child   NO PAST SURGERIES     REMOVAL OF A DIALYSIS CATHETER     TEE WITHOUT CARDIOVERSION N/A 01/07/2018   Procedure: TRANSESOPHAGEAL ECHOCARDIOGRAM (TEE);  Surgeon: Devorah Fonder, MD;  Location: [facility] ORS;  Service: Cardiovascular;  Laterality: N/A;   Patient Active Problem List   Diagnosis Date Noted   Atrial fibrillation (HCC) 08/10/2023   Atrial fibrillation with rapid ventricular response (HCC) 07/03/2023   Atrial  fibrillation with RVR (HCC) 07/02/2023   NSTEMI (non-ST elevated myocardial infarction) (HCC) 07/02/2023   Fall 06/15/2023   Non-compliance with renal dialysis (HCC) 05/02/2023   Obesity (BMI 30-39.9) 04/30/2023   Volume overload 04/29/2023   Renal osteodystrophy 11/25/2022   Unsteady gait 10/05/2022   Steal syndrome of dialysis vascular access (HCC) 05/28/2022   Hydronephrosis, left 05/04/2022   Leukocytosis 05/04/2022   Pre-op evaluation 04/24/2022   Chest pain 01/13/2022   Diabetic retinopathy associated with diabetes mellitus due to underlying condition (HCC) 01/13/2022   ESRD on hemodialysis (HCC) 01/13/2022   Deafness in right ear 08/11/2021   Open wound 01/25/2021   History of colon polyps 10/15/2020   Postoperative hemorrhage involving digestive system following digestive system procedure 09/19/2020   Acute cholecystitis without calculus 09/09/2020   Type 2 diabetes mellitus, with long-term current use of insulin (HCC) 09/09/2020   Cryptogenic stroke (HCC) 07/13/2020   History of loop recorder 07/13/2020   History of 2019 novel coronavirus disease (COVID-19) 05/20/2020   Pneumonia due to COVID-19 virus 04/03/2020   Elevated troponin 04/03/2020   Acquired trigger finger 06/08/2019   Lymphedema 06/08/2019   Anemia 04/17/2019   Swelling of both lower extremities 01/10/2019   Facial droop 04/09/2018   Daytime somnolence 03/30/2018   Carotid artery disease (HCC) 02/03/2018   Intracranial vascular stenosis 09/12/2017  Cough 01/20/2017   Bell's palsy 11/10/2016   History of CVA (cerebrovascular accident) 11/10/2016   Benign localized hyperplasia of prostate with urinary obstruction 10/27/2016   History of nephrolithiasis 10/27/2016   TIA (transient ischemic attack) 10/20/2016   Near syncope 06/23/2016   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   Neuropathy 08/06/2015   Health care maintenance 08/06/2015   Essential hypertension 08/06/2015   Heme positive stool 10/10/2013    Hypothyroidism 10/10/2013   Microalbuminuria 10/10/2013   Hyperlipidemia 10/10/2013   B12 deficiency 10/10/2013   Environmental allergies 07/04/2013    ONSET DATE: 3 years ago  REFERRING DIAG: CVA  THERAPY DIAG:  Muscle weakness (generalized)  Unsteadiness on feet  Difficulty in walking, not elsewhere classified  Abnormality of gait and mobility  Other abnormalities of gait and mobility  Other lack of coordination  Rationale for Evaluation and Treatment: Rehabilitation  SUBJECTIVE:                                                                                                                                                                                             SUBJECTIVE STATEMENT:  Pt reports he is still getting used to his new AFO, states it "feels weird." States some days he has a hard time getting it on so he leaves it off, but most days he wears it. Denies pain today. Denies stumbles/falls since last visit.    Pt accompanied by: self  PERTINENT HISTORY: Patient returning to PT after hospitalization after being off HD for past 3 months with end stage ESRD. PMH DM, HTN, Anemia, Bells palsy, Hypothyroidism, Kidney stones, stroke and pseudotumour cerebri. Macular degeneration with impaired visual acuity   PAIN:  Are you having pain? No  PRECAUTIONS: Fall  RED FLAGS: None   WEIGHT BEARING RESTRICTIONS: No  FALLS: Has patient fallen in last 6 months? No  PATIENT GOALS: to get LLE stronger and more steady  OBJECTIVE:   TODAY'S TREATMENT: DATE: 12/10/23   Pt reports he is performing daily skin checks since he started wearing the AFO and hasn't noticed any redness. (Reinforced education on importance of this given decreased sensation and pt reports he has a Ship broker he can use for this).   Dynamic gait training ~430ft including forward gait and backwards gait (~60ft x2) with progression to performing head rotations R and L - requires consistent CGA/light  min A with 1x mod A to correct posterior LOB during backwards gait - pt with significant difficulty with backwards ambulation and stopping his posterior momentum. With repetition, pt demos more normal BOS (not as excessively wide), but continues to have abnormal alignment of L foot.  Pt reports chronic swelling in L LE compared to R, even from when he was a teenager, and is observed today.  Doffed AFO and assessed L ankle AROM with pt demonstrating active ankle DF and ankle eversion lacking some ROM, but not significant; however, noticed excessive L forefoot adduction and inversion (abnormal foot/ankle alignment is coming primarily from his foot and less so from his ankle) - pt reports was diagnosed with L 5th metatarsal hairline fracture within the last 10 years and states he feels it never healed correctly Educated pt that he may benefit from specific exercises added to his HEP targeting L ankle/foot strengthening  May benefit from L shoe orthotic insert in addition to AFO due to pt's forefoot deformity and high arch - will defer to primary PT  Dynamic balance interventions including:  - side stepping over 1/2 foam roll with CGA and occasional light min A for steadying  - forward/backwards stepping over 1/2 foam roll with more consistent min A for balance especially when stepping back leading with L LE   - progressed to having 2 Blaze Pods set behind pt to tap after he steps backwards over foam roll - set on sequence pattern to alternate lead LE when stepping back - requires consistent min A for balance safety - step-ups onto green step with 1x purple plate x8 reps leading with R LE without UE support, but transition next to a balance bar when stepping up leading with L LE x8 reps with pt requiring use of bar support initially, but progressed to hovering his hand - 4 Blaze Pods (2 on green step with 1x purple plate and 2 on the ground lateral to patient) performing foot taps - requires min A for  balance with pt having greatest challenge maintaining balance on L LE to tap R LE    PATIENT EDUCATION: Education details:exercise technique  Person educated: Patient Education method: Explanation, Demonstration, Tactile cues, and Verbal cues Education comprehension: verbalized understanding, returned demonstration, verbal cues required, and tactile cues required  HOME EXERCISE PROGRAM: Access Code: [facility] URL: [email or web address] Date: 06/17/2023 Prepared by: Ferrell Hu  Exercises - Standing Balance in Corner with Eyes Closed  - 1 x daily - 2-3 x weekly - 3 sets - up to 30 sec hold - Standing Balance in Corner  - 1 x daily - 2-3 x weekly - 3 sets - 30 sec hold - Corner Balance Feet Apart: Eyes Open With Head Turns  - 1 x daily - 2-3 x weekly - 3 sets - 10 reps - Sit to Stand with Arms Crossed  - 1 x daily - 3 x weekly - 3 sets - 10 reps    GOALS:  Goals reviewed with patient? Yes  SHORT TERM GOALS: Target date: 06/17/2023   Patient will be independent in home exercise program to improve strength/mobility for better functional independence with ADLs. Baseline: 11/19: compliant  Goal status: MET   LONG TERM GOALS: Target date: 02/04/2024  Patient will increase FOTO score to equal to or greater than   72%  to demonstrate statistically significant improvement in mobility and quality of life.  Baseline: 9/24: 65% 11/19: 60%  Goal status: deferred  2.   Patient (< 24 years old) will complete five times sit to stand test in < 10 seconds indicating an increased LE strength and improved balance. Baseline: 9/24: 20.23 seconds no hands, one near LOB  11/19:15.02 seconds no hands 09/22/23: 17.35 secs no hands 3/19: 14 seconds no hands ; one  LOB  Goal status: Partially Met  3.   Patient will increase Berg Balance score by >45/56 to demonstrate decreased fall risk during functional activities.  Baseline: 39  11/19: 44 3/19: 45 Goal status: Partially Met    4.    Patient will increase six minute walk test distance to >1400 for progression to age norm community ambulator and improve gait ability Baseline: 9/24: 1060  11/19: 1070 ft  09/22/23: 964 ft 3/19: interrupted by fire drill.  Goal status: In Progress   5.    Patient will be independent with ascend/descend 12 steps using single UE in step over step pattern without LOB.  Baseline:11/19: sometimes has to use hands and feet like a ladder to get up the stairs  09/22/23: Pt utilized R UE for all 12 steps, required BUE's for 2 instances of LOB stepping down on the R LE; assistance provided by therapist to remain upright 3/19: BUE support for descending  Goal status: Ongoing  6.   Patient will increase lower extremity functional scale to >60/80 to demonstrate improved functional mobility and increased tolerance with ADLs.  Baseline: 3/19: 40 Goal status: INITIAL   ASSESSMENT:  CLINICAL IMPRESSION: Patient arrives highly motivated to participate in therapy session. Pt wearing his new AFO and states it is taking time for him to get used to it. Upon assessment, noticed pt's L foot/ankle malalignment also seems to be coming from his forefoot being excessively adducted/inverted and may benefit from shoe insert orthotic. Therapy session focused on dynamic gait training and dynamic stepping balance challenges with pt demonstrating greatest balance instability with backwards stepping and backwards gait training. Mr. Purdy will continue to benefit from skilled physical therapy intervention to address impairments, improve QOL, and attain therapy goals.       OBJECTIVE IMPAIRMENTS: Abnormal gait, cardiopulmonary status limiting activity, decreased activity tolerance, decreased balance, decreased coordination, decreased mobility, difficulty walking, decreased strength, impaired flexibility, impaired sensation, impaired vision/preception, improper body mechanics, and postural dysfunction.   ACTIVITY  LIMITATIONS: carrying, lifting, bending, sitting, standing, squatting, stairs, transfers, bed mobility, dressing, reach over head, hygiene/grooming, locomotion level, and caring for others  PARTICIPATION LIMITATIONS: meal prep, cleaning, laundry, personal finances, driving, shopping, community activity, occupation, and yard work  PERSONAL FACTORS: Age, Fitness, Past/current experiences, Time since onset of injury/illness/exacerbation, and 3+ comorbidities: DM, HTN, Anemia, Bells palsy, Hypothyroidism, Kidney stones, stroke and pseudotumour cerebri  are also affecting patient's functional outcome.   REHAB POTENTIAL: Good  CLINICAL DECISION MAKING: Evolving/moderate complexity  EVALUATION COMPLEXITY: Moderate  PLAN:  PT FREQUENCY: 2x/week  PT DURATION: 8 weeks  PLANNED INTERVENTIONS: Therapeutic exercises, Therapeutic activity, Neuromuscular re-education, Balance training, Gait training, Patient/Family education, Self Care, Joint mobilization, Stair training, Vestibular training, Canalith repositioning, Visual/preceptual remediation/compensation, Orthotic/Fit training, DME instructions, Dry Needling, Spinal mobilization, Cryotherapy, Moist heat, Splintting, Taping, Traction, Manual therapy, and Re-evaluation  PLAN FOR NEXT SESSION:   - assess for need of L shoe orthotic insert - provide specific L foot/ankle strengthening exercises on HEP  - continue focusing on L LE strengthening and L stance control  - dynamic balance and gait targeting backwards stepping  hip flexor strength and ankle strength, balance, resisted multiplanar walking, L LE high level dynamic weight shifting    Jayceon Troy, PT, DPT, NCS, CSRS Physical Therapist - [place]  [facility]  2:49 PM 12/10/23

## 2023-12-13 NOTE — Therapy (Signed)
 OUTPATIENT PHYSICAL THERAPY TREATMENT  Patient Name: Gregory Crane MRN: [id number] DOB:1971-03-16, 53 y.o., male Today's Date: 12/15/2023   PCP: Dellar Fenton MD REFERRING PROVIDER: Dellar Fenton MD  END OF SESSION:   PT End of Session - 12/15/23 1055     Visit Number 29    Number of Visits 45    Date for PT Re-Evaluation 02/04/24    Authorization Type Medicare (trad)    Authorization Time Period 09/21/22-11/17/23    Progress Note Due on Visit 30    PT Start Time 1058    PT Stop Time 1143    PT Time Calculation (min) 45 min    Equipment Utilized During Treatment Gait belt    Activity Tolerance Patient tolerated treatment well;No increased pain;Patient limited by fatigue    Behavior During Therapy [facility] for tasks assessed/performed               Past Medical History:  Diagnosis Date   Allergy    Anemia    Bell's palsy    Diabetes mellitus without complication (HCC)    diet controlled   Fall 07/2023   Hypertension    Hypothyroidism    Kidney stones    Pseudotumor cerebri    Stroke [facility])    Past Surgical History:  Procedure Laterality Date   COLONOSCOPY WITH PROPOFOL  N/A 03/30/2020   Procedure: COLONOSCOPY WITH PROPOFOL ;  Surgeon: Shane Darling, MD;  Location: [facility] ENDOSCOPY;  Service: Endoscopy;  Laterality: N/A;   LOOP RECORDER INSERTION N/A 01/27/2018   Procedure: LOOP RECORDER INSERTION;  Surgeon: Verona Goodwill, MD;  Location: [facility] INVASIVE CV LAB;  Service: Cardiovascular;  Laterality: N/A;   LUMBAR PUNCTURE     as child   NO PAST SURGERIES     REMOVAL OF A DIALYSIS CATHETER     TEE WITHOUT CARDIOVERSION N/A 01/07/2018   Procedure: TRANSESOPHAGEAL ECHOCARDIOGRAM (TEE);  Surgeon: Devorah Fonder, MD;  Location: [facility] ORS;  Service: Cardiovascular;  Laterality: N/A;   Patient Active Problem List   Diagnosis Date Noted   Atrial fibrillation (HCC) 08/10/2023   Atrial fibrillation with rapid ventricular response (HCC) 07/03/2023   Atrial  fibrillation with RVR (HCC) 07/02/2023   NSTEMI (non-ST elevated myocardial infarction) (HCC) 07/02/2023   Fall 06/15/2023   Non-compliance with renal dialysis (HCC) 05/02/2023   Obesity (BMI 30-39.9) 04/30/2023   Volume overload 04/29/2023   Renal osteodystrophy 11/25/2022   Unsteady gait 10/05/2022   Steal syndrome of dialysis vascular access (HCC) 05/28/2022   Hydronephrosis, left 05/04/2022   Leukocytosis 05/04/2022   Pre-op evaluation 04/24/2022   Chest pain 01/13/2022   Diabetic retinopathy associated with diabetes mellitus due to underlying condition (HCC) 01/13/2022   ESRD on hemodialysis (HCC) 01/13/2022   Deafness in right ear 08/11/2021   Open wound 01/25/2021   History of colon polyps 10/15/2020   Postoperative hemorrhage involving digestive system following digestive system procedure 09/19/2020   Acute cholecystitis without calculus 09/09/2020   Type 2 diabetes mellitus, with long-term current use of insulin  (HCC) 09/09/2020   Cryptogenic stroke (HCC) 07/13/2020   History of loop recorder 07/13/2020   History of 2019 novel coronavirus disease (COVID-19) 05/20/2020   Pneumonia due to COVID-19 virus 04/03/2020   Elevated troponin 04/03/2020   Acquired trigger finger 06/08/2019   Lymphedema 06/08/2019   Anemia 04/17/2019   Swelling of both lower extremities 01/10/2019   Facial droop 04/09/2018   Daytime somnolence 03/30/2018   Carotid artery disease (HCC) 02/03/2018   Intracranial vascular stenosis  09/12/2017   Cough 01/20/2017   Bell's palsy 11/10/2016   History of CVA (cerebrovascular accident) 11/10/2016   Benign localized hyperplasia of prostate with urinary obstruction 10/27/2016   History of nephrolithiasis 10/27/2016   TIA (transient ischemic attack) 10/20/2016   Near syncope 06/23/2016   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   Neuropathy 08/06/2015   Health care maintenance 08/06/2015   Essential hypertension 08/06/2015   Heme positive stool 10/10/2013    Hypothyroidism 10/10/2013   Microalbuminuria 10/10/2013   Hyperlipidemia 10/10/2013   B12 deficiency 10/10/2013   Environmental allergies 07/04/2013    ONSET DATE: 3 years ago  REFERRING DIAG: CVA  THERAPY DIAG:  Muscle weakness (generalized)  Unsteadiness on feet  Difficulty in walking, not elsewhere classified  Abnormality of gait and mobility  Rationale for Evaluation and Treatment: Rehabilitation  SUBJECTIVE:                                                                                                                                                                                             SUBJECTIVE STATEMENT:  Patient reports he is still getting used to his AFO    Pt accompanied by: self  PERTINENT HISTORY: Patient returning to PT after hospitalization after being off HD for past 3 months with end stage ESRD. PMH DM, HTN, Anemia, Bells palsy, Hypothyroidism, Kidney stones, stroke and pseudotumour cerebri. Macular degeneration with impaired visual acuity   PAIN:  Are you having pain? No  PRECAUTIONS: Fall  RED FLAGS: None   WEIGHT BEARING RESTRICTIONS: No  FALLS: Has patient fallen in last 6 months? No  PATIENT GOALS: to get LLE stronger and more steady  OBJECTIVE:   TODAY'S TREATMENT: DATE: 12/15/23 Neuro Re-ed: Standing with CGA next to support surface:  Airex balance beam: -lateral step 4x length of // bars -static stand 30 seconds x 2 trials -static stand with horizontal head turns 10x -tandem walk 4x length  Single  leg stance 30 seconds x2 trials     TA Nustep seat position 9; Lvl 4; 5 minutes  Step over orange hurdle 15x each LE; finger tip support Large step over orange hurdle 15x each LE finger tip support  Ambulate 150 ft with new Afo to regulate foot position and control of spatial awareness.    STS:15x;  Some decreased eccentric control with fatigue. One lateral LOB min A to maintain balance. No UE support           Pt  required occasional rest breaks due fatigue, PT was attentive to when pt appeared to be tired or winded in order to prevent excessive fatigue.     PATIENT  EDUCATION: Education details:exercise technique  Person educated: Patient Education method: Explanation, Demonstration, Tactile cues, and Verbal cues Education comprehension: verbalized understanding, returned demonstration, verbal cues required, and tactile cues required  HOME EXERCISE PROGRAM: Access Code: [facility] URL: [email or web address] Date: 06/17/2023 Prepared by: Ferrell Hu  Exercises - Standing Balance in Corner with Eyes Closed  - 1 x daily - 2-3 x weekly - 3 sets - up to 30 sec hold - Standing Balance in Corner  - 1 x daily - 2-3 x weekly - 3 sets - 30 sec hold - Corner Balance Feet Apart: Eyes Open With Head Turns  - 1 x daily - 2-3 x weekly - 3 sets - 10 reps - Sit to Stand with Arms Crossed  - 1 x daily - 3 x weekly - 3 sets - 10 reps    GOALS:  Goals reviewed with patient? Yes  SHORT TERM GOALS: Target date: 06/17/2023   Patient will be independent in home exercise program to improve strength/mobility for better functional independence with ADLs. Baseline: 11/19: compliant  Goal status: MET   LONG TERM GOALS: Target date: 02/04/2024  Patient will increase FOTO score to equal to or greater than   72%  to demonstrate statistically significant improvement in mobility and quality of life.  Baseline: 9/24: 65% 11/19: 60%  Goal status: deferred  2.   Patient (< 76 years old) will complete five times sit to stand test in < 10 seconds indicating an increased LE strength and improved balance. Baseline: 9/24: 20.23 seconds no hands, one near LOB  11/19:15.02 seconds no hands 09/22/23: 17.35 secs no hands 3/19: 14 seconds no hands ; one LOB  Goal status: Partially Met  3.   Patient will increase Berg Balance score by >45/56 to demonstrate decreased fall risk during functional activities.   Baseline: 39  11/19: 44 3/19: 45 Goal status: Partially Met   4.    Patient will increase six minute walk test distance to >1400 for progression to age norm community ambulator and improve gait ability Baseline: 9/24: 1060  11/19: 1070 ft  09/22/23: 964 ft 3/19: interrupted by fire drill.  Goal status: In Progress   5.    Patient will be independent with ascend/descend 12 steps using single UE in step over step pattern without LOB.  Baseline:11/19: sometimes has to use hands and feet like a ladder to get up the stairs  09/22/23: Pt utilized R UE for all 12 steps, required BUE's for 2 instances of LOB stepping down on the R LE; assistance provided by therapist to remain upright 3/19: BUE support for descending  Goal status: Ongoing  6.   Patient will increase lower extremity functional scale to >60/80 to demonstrate improved functional mobility and increased tolerance with ADLs.  Baseline: 3/19: 40 Goal status: INITIAL   ASSESSMENT:  CLINICAL IMPRESSION: Patient is highly motivated throughout session. He is able to perform increased stability demanding tasks with decreasing need for UE support. Sit to stands continue to challenge him with decreased episodes instability and posterior LOB. Gregory Crane will continue to benefit from skilled physical therapy intervention to address impairments, improve QOL, and attain therapy goals.       OBJECTIVE IMPAIRMENTS: Abnormal gait, cardiopulmonary status limiting activity, decreased activity tolerance, decreased balance, decreased coordination, decreased mobility, difficulty walking, decreased strength, impaired flexibility, impaired sensation, impaired vision/preception, improper body mechanics, and postural dysfunction.   ACTIVITY LIMITATIONS: carrying, lifting, bending, sitting, standing, squatting, stairs, transfers, bed mobility, dressing, reach over head, hygiene/grooming,  locomotion level, and caring for others  PARTICIPATION LIMITATIONS:  meal prep, cleaning, laundry, personal finances, driving, shopping, community activity, occupation, and yard work  PERSONAL FACTORS: Age, Fitness, Past/current experiences, Time since onset of injury/illness/exacerbation, and 3+ comorbidities: DM, HTN, Anemia, Bells palsy, Hypothyroidism, Kidney stones, stroke and pseudotumour cerebri  are also affecting patient's functional outcome.   REHAB POTENTIAL: Good  CLINICAL DECISION MAKING: Evolving/moderate complexity  EVALUATION COMPLEXITY: Moderate  PLAN:  PT FREQUENCY: 2x/week  PT DURATION: 8 weeks  PLANNED INTERVENTIONS: Therapeutic exercises, Therapeutic activity, Neuromuscular re-education, Balance training, Gait training, Patient/Family education, Self Care, Joint mobilization, Stair training, Vestibular training, Canalith repositioning, Visual/preceptual remediation/compensation, Orthotic/Fit training, DME instructions, Dry Needling, Spinal mobilization, Cryotherapy, Moist heat, Splintting, Taping, Traction, Manual therapy, and Re-evaluation  PLAN FOR NEXT SESSION:   - assess for need of L shoe orthotic insert - provide specific L foot/ankle strengthening exercises on HEP  - continue focusing on L LE strengthening and L stance control  - dynamic balance and gait targeting backwards stepping  hip flexor strength and ankle strength, balance, resisted multiplanar walking, L LE high level dynamic weight shifting    Gregory Carton  Brain Crane, PT, DPT Physical Therapist - [facility]  Outpatient Physical Therapy- Main Campus [phone]    11:49 AM 12/15/23

## 2023-12-15 ENCOUNTER — Ambulatory Visit: Payer: Medicare Other

## 2023-12-15 DIAGNOSIS — M6281 Muscle weakness (generalized): Secondary | ICD-10-CM | POA: Diagnosis not present

## 2023-12-15 DIAGNOSIS — R269 Unspecified abnormalities of gait and mobility: Secondary | ICD-10-CM

## 2023-12-15 DIAGNOSIS — R262 Difficulty in walking, not elsewhere classified: Secondary | ICD-10-CM

## 2023-12-15 DIAGNOSIS — R2681 Unsteadiness on feet: Secondary | ICD-10-CM

## 2023-12-17 ENCOUNTER — Ambulatory Visit: Payer: Medicare Other

## 2023-12-18 NOTE — Therapy (Signed)
 OUTPATIENT PHYSICAL THERAPY TREATMENT/ Physical Therapy Progress Note   Dates of reporting period   11/10/23  to   12/22/23    Patient Name: Gregory Crane MRN: [id number] DOB:07/26/1971, 53 y.o., male Today's Date: 12/22/2023   PCP: Dellar Fenton MD REFERRING PROVIDER: Dellar Fenton MD  END OF SESSION:   PT End of Session - 12/22/23 0924     Visit Number 30    Number of Visits 45    Date for PT Re-Evaluation 02/04/24    Authorization Type Medicare (trad)    Authorization Time Period 09/21/22-11/17/23    Progress Note Due on Visit 30    PT Start Time 0930    PT Stop Time 1014    PT Time Calculation (min) 44 min    Equipment Utilized During Treatment Gait belt    Activity Tolerance Patient tolerated treatment well;No increased pain;Patient limited by fatigue    Behavior During Therapy [facility] for tasks assessed/performed                Past Medical History:  Diagnosis Date   Allergy    Anemia    Bell's palsy    Diabetes mellitus without complication (HCC)    diet controlled   Fall 07/2023   Hypertension    Hypothyroidism    Kidney stones    Pseudotumor cerebri    Stroke [facility])    Past Surgical History:  Procedure Laterality Date   COLONOSCOPY WITH PROPOFOL  N/A 03/30/2020   Procedure: COLONOSCOPY WITH PROPOFOL ;  Surgeon: Shane Darling, MD;  Location: [facility] ENDOSCOPY;  Service: Endoscopy;  Laterality: N/A;   LOOP RECORDER INSERTION N/A 01/27/2018   Procedure: LOOP RECORDER INSERTION;  Surgeon: Verona Goodwill, MD;  Location: [facility] INVASIVE CV LAB;  Service: Cardiovascular;  Laterality: N/A;   LUMBAR PUNCTURE     as child   NO PAST SURGERIES     REMOVAL OF A DIALYSIS CATHETER     TEE WITHOUT CARDIOVERSION N/A 01/07/2018   Procedure: TRANSESOPHAGEAL ECHOCARDIOGRAM (TEE);  Surgeon: Devorah Fonder, MD;  Location: [facility] ORS;  Service: Cardiovascular;  Laterality: N/A;   Patient Active Problem List   Diagnosis Date Noted   Atrial fibrillation (HCC)  08/10/2023   Atrial fibrillation with rapid ventricular response (HCC) 07/03/2023   Atrial fibrillation with RVR (HCC) 07/02/2023   NSTEMI (non-ST elevated myocardial infarction) (HCC) 07/02/2023   Fall 06/15/2023   Non-compliance with renal dialysis (HCC) 05/02/2023   Obesity (BMI 30-39.9) 04/30/2023   Volume overload 04/29/2023   Renal osteodystrophy 11/25/2022   Unsteady gait 10/05/2022   Steal syndrome of dialysis vascular access (HCC) 05/28/2022   Hydronephrosis, left 05/04/2022   Leukocytosis 05/04/2022   Pre-op evaluation 04/24/2022   Chest pain 01/13/2022   Diabetic retinopathy associated with diabetes mellitus due to underlying condition (HCC) 01/13/2022   ESRD on hemodialysis (HCC) 01/13/2022   Deafness in right ear 08/11/2021   Open wound 01/25/2021   History of colon polyps 10/15/2020   Postoperative hemorrhage involving digestive system following digestive system procedure 09/19/2020   Acute cholecystitis without calculus 09/09/2020   Type 2 diabetes mellitus, with long-term current use of insulin  (HCC) 09/09/2020   Cryptogenic stroke (HCC) 07/13/2020   History of loop recorder 07/13/2020   History of 2019 novel coronavirus disease (COVID-19) 05/20/2020   Pneumonia due to COVID-19 virus 04/03/2020   Elevated troponin 04/03/2020   Acquired trigger finger 06/08/2019   Lymphedema 06/08/2019   Anemia 04/17/2019   Swelling of both lower extremities 01/10/2019  Facial droop 04/09/2018   Daytime somnolence 03/30/2018   Carotid artery disease (HCC) 02/03/2018   Intracranial vascular stenosis 09/12/2017   Cough 01/20/2017   Bell's palsy 11/10/2016   History of CVA (cerebrovascular accident) 11/10/2016   Benign localized hyperplasia of prostate with urinary obstruction 10/27/2016   History of nephrolithiasis 10/27/2016   TIA (transient ischemic attack) 10/20/2016   Near syncope 06/23/2016   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   Neuropathy 08/06/2015   Health care maintenance  08/06/2015   Essential hypertension 08/06/2015   Heme positive stool 10/10/2013   Hypothyroidism 10/10/2013   Microalbuminuria 10/10/2013   Hyperlipidemia 10/10/2013   B12 deficiency 10/10/2013   Environmental allergies 07/04/2013    ONSET DATE: 3 years ago  REFERRING DIAG: CVA  THERAPY DIAG:  Muscle weakness (generalized)  Unsteadiness on feet  Difficulty in walking, not elsewhere classified  Abnormality of gait and mobility  Rationale for Evaluation and Treatment: Rehabilitation  SUBJECTIVE:                                                                                                                                                                                             SUBJECTIVE STATEMENT: Patient reports no aches or pains. No LOB. Has a little anxiety this morning.   Pt accompanied by: self  PERTINENT HISTORY: Patient returning to PT after hospitalization after being off HD for past 3 months with end stage ESRD. PMH DM, HTN, Anemia, Bells palsy, Hypothyroidism, Kidney stones, stroke and pseudotumour cerebri. Macular degeneration with impaired visual acuity   PAIN:  Are you having pain? No  PRECAUTIONS: Fall  RED FLAGS: None   WEIGHT BEARING RESTRICTIONS: No  FALLS: Has patient fallen in last 6 months? No  PATIENT GOALS: to get LLE stronger and more steady  OBJECTIVE:   TODAY'S TREATMENT: DATE: 12/22/23  Physical therapy treatment session today consisted of completing assessment of goals and administration of testing as demonstrated and documented in flow sheet, treatment, and goals section of this note. Addition treatments may be found below.   Pt performed 5 time sit<>stand (5xSTS): 11.96 sec (>15 sec indicates increased fall risk)    [facility] PT Assessment - 12/22/23 0001       Standardized Balance Assessment   Standardized Balance Assessment Berg Balance Test      Berg Balance Test   Sit to Stand Able to stand without using hands and stabilize  independently    Standing Unsupported Able to stand safely 2 minutes    Sitting with Back Unsupported but Feet Supported on Floor or Stool Able to sit safely and securely 2 minutes    Stand to  Sit Sits safely with minimal use of hands    Transfers Able to transfer safely, minor use of hands    Standing Unsupported with Eyes Closed Able to stand 10 seconds with supervision    Standing Unsupported with Feet Together Able to place feet together independently and stand for 1 minute with supervision    From Standing, Reach Forward with Outstretched Arm Can reach forward >12 cm safely (5")    From Standing Position, Pick up Object from Floor Able to pick up shoe safely and easily    From Standing Position, Turn to Look Behind Over each Shoulder Looks behind from both sides and weight shifts well    Turn 360 Degrees Able to turn 360 degrees safely one side only in 4 seconds or less    Standing Unsupported, Alternately Place Feet on Step/Stool Able to stand independently and complete 8 steps >20 seconds    Standing Unsupported, One Foot in Front Able to take small step independently and hold 30 seconds    Standing on One Leg Tries to lift leg/unable to hold 3 seconds but remains standing independently    Total Score 46             6 Min Walk Test:  Instructed patient to ambulate as quickly and as safely as possible for 6 minutes using LRAD. Patient was allowed to take standing rest breaks without stopping the test, but if the patient required a sitting rest break the clock would be stopped and the test would be over.  Results: 900 feet with CGA. Results indicate that the patient has reduced endurance with ambulation compared to age matched norms.  Age Matched Norms: 18-69 yo M: 31 F: 23, 37-79 yo M: 36 F: 471, 36-89 yo M: 417 F: 392 MDC: 58.21 meters (190.98 feet) or 50 meters (ANPTA Core Set of Outcome Measures for Adults with Neurologic Conditions, 2018)  LEFS: 43  Neuro Re-ed: Standing  with CGA next to support surface:  Airex pad: -static stand 60 seconds -head turns horizontal 10x -vertical head turns 10x -march 10x each LE; very challenging Standing row BTB 15x       Pt required occasional rest breaks due fatigue, PT was attentive to when pt appeared to be tired or winded in order to prevent excessive fatigue.     PATIENT EDUCATION: Education Journalist, newspaper  Person educated: Patient Education method: Explanation, Demonstration, Tactile cues, and Verbal cues Education comprehension: verbalized understanding, returned demonstration, verbal cues required, and tactile cues required  HOME EXERCISE PROGRAM: Access Code: [facility] URL: [email or web address] Date: 06/17/2023 Prepared by: Ferrell Hu  Exercises - Standing Balance in Corner with Eyes Closed  - 1 x daily - 2-3 x weekly - 3 sets - up to 30 sec hold - Standing Balance in Corner  - 1 x daily - 2-3 x weekly - 3 sets - 30 sec hold - Corner Balance Feet Apart: Eyes Open With Head Turns  - 1 x daily - 2-3 x weekly - 3 sets - 10 reps - Sit to Stand with Arms Crossed  - 1 x daily - 3 x weekly - 3 sets - 10 reps    GOALS:  Goals reviewed with patient? Yes  SHORT TERM GOALS: Target date: 06/17/2023   Patient will be independent in home exercise program to improve strength/mobility for better functional independence with ADLs. Baseline: 11/19: compliant  Goal status: MET   LONG TERM GOALS: Target date: 02/04/2024  Patient will increase FOTO score to  equal to or greater than   72%  to demonstrate statistically significant improvement in mobility and quality of life.  Baseline: 9/24: 65% 11/19: 60%  Goal status: deferred  2.   Patient (< 58 years old) will complete five times sit to stand test in < 10 seconds indicating an increased LE strength and improved balance. Baseline: 9/24: 20.23 seconds no hands, one near LOB  11/19:15.02 seconds no hands 09/22/23: 17.35 secs  no hands 3/19: 14 seconds no hands ; one LOB  4/28: 11.96 seconds Goal status: Partially Met  3.   Patient will increase Berg Balance score by >45/56 to demonstrate decreased fall risk during functional activities.  Baseline: 39  11/19: 44 3/19: 45 4/28: 46 Goal status: Partially Met   4.    Patient will increase six minute walk test distance to >1400 for progression to age norm community ambulator and improve gait ability Baseline: 9/24: 1060  11/19: 1070 ft  09/22/23: 964 ft 3/19: interrupted by fire drill.  4/28: 900 ft with new AFO Goal status: In Progress   5.    Patient will be independent with ascend/descend 12 steps using single UE in step over step pattern without LOB.  Baseline:11/19: sometimes has to use hands and feet like a ladder to get up the stairs  09/22/23: Pt utilized R UE for all 12 steps, required BUE's for 2 instances of LOB stepping down on the R LE; assistance provided by therapist to remain upright 3/19: BUE support for descending  4/28: BUE support required, more challenging the past week Goal status: Ongoing  6.   Patient will increase lower extremity functional scale to >60/80 to demonstrate improved functional mobility and increased tolerance with ADLs.  Baseline: 3/19: 40 4/28: 43 Goal status: INITIAL   ASSESSMENT:  CLINICAL IMPRESSION: Patient has excellent progress towards functional goals. He does have decreased ambulation capacity with 6 minute walk test due to getting accustomed to new AFO.  Patient's condition has the potential to improve in response to therapy. Maximum improvement is yet to be obtained. The anticipated improvement is attainable and reasonable in a generally predictable time.    Mr. Penland will continue to benefit from skilled physical therapy intervention to address impairments, improve QOL, and attain therapy goals.       OBJECTIVE IMPAIRMENTS: Abnormal gait, cardiopulmonary status limiting activity, decreased activity  tolerance, decreased balance, decreased coordination, decreased mobility, difficulty walking, decreased strength, impaired flexibility, impaired sensation, impaired vision/preception, improper body mechanics, and postural dysfunction.   ACTIVITY LIMITATIONS: carrying, lifting, bending, sitting, standing, squatting, stairs, transfers, bed mobility, dressing, reach over head, hygiene/grooming, locomotion level, and caring for others  PARTICIPATION LIMITATIONS: meal prep, cleaning, laundry, personal finances, driving, shopping, community activity, occupation, and yard work  PERSONAL FACTORS: Age, Fitness, Past/current experiences, Time since onset of injury/illness/exacerbation, and 3+ comorbidities: DM, HTN, Anemia, Bells palsy, Hypothyroidism, Kidney stones, stroke and pseudotumour cerebri  are also affecting patient's functional outcome.   REHAB POTENTIAL: Good  CLINICAL DECISION MAKING: Evolving/moderate complexity  EVALUATION COMPLEXITY: Moderate  PLAN:  PT FREQUENCY: 2x/week  PT DURATION: 8 weeks  PLANNED INTERVENTIONS: Therapeutic exercises, Therapeutic activity, Neuromuscular re-education, Balance training, Gait training, Patient/Family education, Self Care, Joint mobilization, Stair training, Vestibular training, Canalith repositioning, Visual/preceptual remediation/compensation, Orthotic/Fit training, DME instructions, Dry Needling, Spinal mobilization, Cryotherapy, Moist heat, Splintting, Taping, Traction, Manual therapy, and Re-evaluation  PLAN FOR NEXT SESSION:   - assess for need of L shoe orthotic insert - provide specific L foot/ankle strengthening exercises on  HEP  - continue focusing on L LE strengthening and L stance control  - dynamic balance and gait targeting backwards stepping  hip flexor strength and ankle strength, balance, resisted multiplanar walking, L LE high level dynamic weight shifting    Kristene Liberati  Brain Cahill, PT, DPT Physical Therapist - [facility]  Outpatient Physical Therapy- Main Campus [phone]    10:14 AM 12/22/23

## 2023-12-22 ENCOUNTER — Ambulatory Visit: Payer: Medicare Other

## 2023-12-22 DIAGNOSIS — R2681 Unsteadiness on feet: Secondary | ICD-10-CM

## 2023-12-22 DIAGNOSIS — M6281 Muscle weakness (generalized): Secondary | ICD-10-CM

## 2023-12-22 DIAGNOSIS — R262 Difficulty in walking, not elsewhere classified: Secondary | ICD-10-CM

## 2023-12-22 DIAGNOSIS — R269 Unspecified abnormalities of gait and mobility: Secondary | ICD-10-CM

## 2023-12-22 DIAGNOSIS — J9601 Acute respiratory failure with hypoxia: Secondary | ICD-10-CM | POA: Diagnosis not present

## 2023-12-22 DIAGNOSIS — J189 Pneumonia, unspecified organism: Secondary | ICD-10-CM | POA: Diagnosis not present

## 2023-12-23 ENCOUNTER — Observation Stay

## 2023-12-23 ENCOUNTER — Other Ambulatory Visit: Payer: Self-pay

## 2023-12-23 ENCOUNTER — Inpatient Hospital Stay
Admission: EM | Admit: 2023-12-23 | Discharge: 2023-12-26 | DRG: 193 | Disposition: A | Discharge status: Home / Self Care | Attending: Internal Medicine | Admitting: Internal Medicine

## 2023-12-23 ENCOUNTER — Emergency Department

## 2023-12-23 DIAGNOSIS — J189 Pneumonia, unspecified organism: Principal | ICD-10-CM

## 2023-12-23 DIAGNOSIS — R001 Bradycardia, unspecified: Secondary | ICD-10-CM | POA: Diagnosis not present

## 2023-12-23 DIAGNOSIS — I4891 Unspecified atrial fibrillation: Secondary | ICD-10-CM | POA: Diagnosis present

## 2023-12-23 DIAGNOSIS — E8770 Fluid overload, unspecified: Secondary | ICD-10-CM

## 2023-12-23 DIAGNOSIS — J9601 Acute respiratory failure with hypoxia: Secondary | ICD-10-CM | POA: Diagnosis present

## 2023-12-23 DIAGNOSIS — I5033 Acute on chronic diastolic (congestive) heart failure: Secondary | ICD-10-CM

## 2023-12-23 DIAGNOSIS — R7989 Other specified abnormal findings of blood chemistry: Secondary | ICD-10-CM | POA: Diagnosis not present

## 2023-12-23 DIAGNOSIS — Z6833 Body mass index (BMI) 33.0-33.9, adult: Secondary | ICD-10-CM

## 2023-12-23 DIAGNOSIS — I2489 Other forms of acute ischemic heart disease: Secondary | ICD-10-CM | POA: Diagnosis present

## 2023-12-23 DIAGNOSIS — E875 Hyperkalemia: Secondary | ICD-10-CM | POA: Diagnosis present

## 2023-12-23 DIAGNOSIS — Z79899 Other long term (current) drug therapy: Secondary | ICD-10-CM

## 2023-12-23 DIAGNOSIS — E669 Obesity, unspecified: Secondary | ICD-10-CM | POA: Diagnosis present

## 2023-12-23 DIAGNOSIS — I69354 Hemiplegia and hemiparesis following cerebral infarction affecting left non-dominant side: Secondary | ICD-10-CM

## 2023-12-23 DIAGNOSIS — Z91148 Patient's other noncompliance with medication regimen for other reason: Secondary | ICD-10-CM

## 2023-12-23 DIAGNOSIS — Z833 Family history of diabetes mellitus: Secondary | ICD-10-CM

## 2023-12-23 DIAGNOSIS — I4892 Unspecified atrial flutter: Secondary | ICD-10-CM

## 2023-12-23 DIAGNOSIS — I10 Essential (primary) hypertension: Secondary | ICD-10-CM | POA: Diagnosis present

## 2023-12-23 DIAGNOSIS — Z992 Dependence on renal dialysis: Secondary | ICD-10-CM

## 2023-12-23 DIAGNOSIS — N2581 Secondary hyperparathyroidism of renal origin: Secondary | ICD-10-CM | POA: Diagnosis present

## 2023-12-23 DIAGNOSIS — Z794 Long term (current) use of insulin: Secondary | ICD-10-CM

## 2023-12-23 DIAGNOSIS — Z1152 Encounter for screening for COVID-19: Secondary | ICD-10-CM

## 2023-12-23 DIAGNOSIS — N186 End stage renal disease: Secondary | ICD-10-CM

## 2023-12-23 DIAGNOSIS — Z8673 Personal history of transient ischemic attack (TIA), and cerebral infarction without residual deficits: Secondary | ICD-10-CM

## 2023-12-23 DIAGNOSIS — I471 Supraventricular tachycardia, unspecified: Secondary | ICD-10-CM | POA: Diagnosis present

## 2023-12-23 DIAGNOSIS — I132 Hypertensive heart and chronic kidney disease with heart failure and with stage 5 chronic kidney disease, or end stage renal disease: Secondary | ICD-10-CM | POA: Diagnosis present

## 2023-12-23 DIAGNOSIS — E119 Type 2 diabetes mellitus without complications: Secondary | ICD-10-CM

## 2023-12-23 DIAGNOSIS — E1169 Type 2 diabetes mellitus with other specified complication: Secondary | ICD-10-CM

## 2023-12-23 DIAGNOSIS — I493 Ventricular premature depolarization: Secondary | ICD-10-CM | POA: Diagnosis not present

## 2023-12-23 DIAGNOSIS — Z87442 Personal history of urinary calculi: Secondary | ICD-10-CM

## 2023-12-23 DIAGNOSIS — E1122 Type 2 diabetes mellitus with diabetic chronic kidney disease: Secondary | ICD-10-CM | POA: Diagnosis present

## 2023-12-23 DIAGNOSIS — Z79890 Hormone replacement therapy (postmenopausal): Secondary | ICD-10-CM

## 2023-12-23 DIAGNOSIS — Z7901 Long term (current) use of anticoagulants: Secondary | ICD-10-CM

## 2023-12-23 DIAGNOSIS — K7460 Unspecified cirrhosis of liver: Secondary | ICD-10-CM | POA: Diagnosis present

## 2023-12-23 DIAGNOSIS — I4719 Other supraventricular tachycardia: Secondary | ICD-10-CM | POA: Diagnosis not present

## 2023-12-23 DIAGNOSIS — E039 Hypothyroidism, unspecified: Secondary | ICD-10-CM | POA: Diagnosis present

## 2023-12-23 DIAGNOSIS — I358 Other nonrheumatic aortic valve disorders: Secondary | ICD-10-CM | POA: Diagnosis present

## 2023-12-23 DIAGNOSIS — D631 Anemia in chronic kidney disease: Secondary | ICD-10-CM | POA: Diagnosis present

## 2023-12-23 LAB — BASIC METABOLIC PANEL WITH GFR
Anion gap: 10 (ref 5–15)
BUN: 88 mg/dL — ABNORMAL HIGH (ref 6–20)
CO2: 19 mmol/L — ABNORMAL LOW (ref 22–32)
Calcium: 7.6 mg/dL — ABNORMAL LOW (ref 8.9–10.3)
Chloride: 107 mmol/L (ref 98–111)
Creatinine, Ser: 10.42 mg/dL — ABNORMAL HIGH (ref 0.61–1.24)
GFR, Estimated: 5 mL/min — ABNORMAL LOW (ref >60)
Glucose, Bld: 226 mg/dL — ABNORMAL HIGH (ref 70–99)
Potassium: 6.2 mmol/L — ABNORMAL HIGH (ref 3.5–5.1)
Sodium: 136 mmol/L (ref 135–145)

## 2023-12-23 LAB — HEPATIC FUNCTION PANEL
ALT: 15 U/L (ref 0–44)
AST: 17 U/L (ref 15–41)
Albumin: 4.4 g/dL (ref 3.5–5.0)
Alkaline Phosphatase: 79 U/L (ref 38–126)
Bilirubin, Direct: <0.1 mg/dL (ref 0.0–0.2)
Total Bilirubin: 0.8 mg/dL (ref 0.0–1.2)
Total Protein: 8.4 g/dL — ABNORMAL HIGH (ref 6.5–8.1)

## 2023-12-23 LAB — BLOOD GAS, VENOUS
Acid-base deficit: 4.3 mmol/L — ABNORMAL HIGH (ref 0.0–2.0)
Bicarbonate: 20.1 mmol/L (ref 20.0–28.0)
O2 Saturation: 91 %
Patient temperature: 37
pCO2, Ven: 34 mmHg — ABNORMAL LOW (ref 44–60)
pH, Ven: 7.38 (ref 7.25–7.43)
pO2, Ven: 63 mmHg — ABNORMAL HIGH (ref 32–45)

## 2023-12-23 LAB — CBC WITH DIFFERENTIAL/PLATELET
Abs Immature Granulocytes: 0.05 10*3/uL (ref 0.00–0.07)
Basophils Absolute: 0 10*3/uL (ref 0.0–0.1)
Basophils Relative: 0 %
Eosinophils Absolute: 0 10*3/uL (ref 0.0–0.5)
Eosinophils Relative: 0 %
HCT: 38 % — ABNORMAL LOW (ref 39.0–52.0)
Hemoglobin: 12.4 g/dL — ABNORMAL LOW (ref 13.0–17.0)
Immature Granulocytes: 0 %
Lymphocytes Relative: 3 %
Lymphs Abs: 0.3 10*3/uL — ABNORMAL LOW (ref 0.7–4.0)
MCH: 30.9 pg (ref 26.0–34.0)
MCHC: 32.6 g/dL (ref 30.0–36.0)
MCV: 94.8 fL (ref 80.0–100.0)
Monocytes Absolute: 0.3 10*3/uL (ref 0.1–1.0)
Monocytes Relative: 3 %
Neutro Abs: 10.5 10*3/uL — ABNORMAL HIGH (ref 1.7–7.7)
Neutrophils Relative %: 94 %
Platelets: 194 10*3/uL (ref 150–400)
RBC: 4.01 MIL/uL — ABNORMAL LOW (ref 4.22–5.81)
RDW: 14 % (ref 11.5–15.5)
WBC: 11.2 10*3/uL — ABNORMAL HIGH (ref 4.0–10.5)
nRBC: 0 % (ref 0.0–0.2)

## 2023-12-23 LAB — MRSA NEXT GEN BY PCR, NASAL: MRSA by PCR Next Gen: NOT DETECTED

## 2023-12-23 LAB — PROCALCITONIN: Procalcitonin: 0.23 ng/mL

## 2023-12-23 LAB — HEPATITIS B SURFACE ANTIGEN: Hepatitis B Surface Ag: NONREACTIVE

## 2023-12-23 LAB — TROPONIN I (HIGH SENSITIVITY)
Troponin I (High Sensitivity): 21 ng/L — ABNORMAL HIGH (ref <18)
Troponin I (High Sensitivity): 25 ng/L — ABNORMAL HIGH (ref <18)

## 2023-12-23 LAB — D-DIMER, QUANTITATIVE: D-Dimer, Quant: 0.87 ug{FEU}/mL — ABNORMAL HIGH (ref 0.00–0.50)

## 2023-12-23 LAB — GLUCOSE, CAPILLARY
Glucose-Capillary: 163 mg/dL — ABNORMAL HIGH (ref 70–99)
Glucose-Capillary: 193 mg/dL — ABNORMAL HIGH (ref 70–99)

## 2023-12-23 LAB — LACTIC ACID, PLASMA
Lactic Acid, Venous: 1.1 mmol/L (ref 0.5–1.9)
Lactic Acid, Venous: 0.8 mmol/L (ref 0.5–1.9)

## 2023-12-23 LAB — BRAIN NATRIURETIC PEPTIDE: B Natriuretic Peptide: 1683.5 pg/mL — ABNORMAL HIGH (ref 0.0–100.0)

## 2023-12-23 MED ORDER — INSULIN ASPART 100 UNIT/ML IJ SOLN
0.0000 [IU] | Freq: Three times a day (TID) | INTRAMUSCULAR | Status: DC
  Administered 2023-12-23 – 2023-12-25 (×2): 2 [IU] via SUBCUTANEOUS
  Filled 2023-12-23 (×2): qty 1

## 2023-12-23 MED ORDER — APIXABAN 5 MG PO TABS
5.0000 mg | ORAL_TABLET | Freq: Two times a day (BID) | ORAL | Status: DC
  Administered 2023-12-23 – 2023-12-26 (×6): 5 mg via ORAL
  Filled 2023-12-23 (×6): qty 1

## 2023-12-23 MED ORDER — SODIUM CHLORIDE 0.9 % IV SOLN
500.0000 mg | INTRAVENOUS | Status: DC
  Administered 2023-12-24 – 2023-12-25 (×2): 500 mg via INTRAVENOUS
  Filled 2023-12-23 (×3): qty 5

## 2023-12-23 MED ORDER — SODIUM CHLORIDE 0.9 % IV SOLN: 250.0000 mL | INTRAVENOUS | Status: AC | PRN

## 2023-12-23 MED ORDER — SODIUM CHLORIDE 0.9 % IV SOLN
500.0000 mg | Freq: Once | INTRAVENOUS | Status: AC
  Administered 2023-12-23: 500 mg via INTRAVENOUS
  Filled 2023-12-23: qty 5

## 2023-12-23 MED ORDER — DEXTROSE 50 % IV SOLN
1.0000 | Freq: Once | INTRAVENOUS | Status: AC
  Administered 2023-12-23: 50 mL via INTRAVENOUS
  Filled 2023-12-23: qty 50

## 2023-12-23 MED ORDER — INSULIN ASPART 100 UNIT/ML IV SOLN
5.0000 [IU] | Freq: Once | INTRAVENOUS | Status: AC
  Administered 2023-12-23: 5 [IU] via INTRAVENOUS
  Filled 2023-12-23: qty 0.05

## 2023-12-23 MED ORDER — CHLORHEXIDINE GLUCONATE CLOTH 2 % EX PADS
6.0000 | MEDICATED_PAD | Freq: Every day | CUTANEOUS | Status: DC
  Administered 2023-12-24 – 2023-12-26 (×3): 6 via TOPICAL
  Filled 2023-12-23: qty 6

## 2023-12-23 MED ORDER — LABETALOL HCL 5 MG/ML IV SOLN: 10.0000 mg | Freq: Once | INTRAVENOUS | Status: DC

## 2023-12-23 MED ORDER — ROSUVASTATIN CALCIUM 10 MG PO TABS
5.0000 mg | ORAL_TABLET | Freq: Every day | ORAL | Status: DC
  Administered 2023-12-23 – 2023-12-26 (×4): 5 mg via ORAL
  Filled 2023-12-23 (×4): qty 1

## 2023-12-23 MED ORDER — SODIUM CHLORIDE 0.9 % IV SOLN
2.0000 g | Freq: Once | INTRAVENOUS | Status: AC
  Administered 2023-12-23: 2 g via INTRAVENOUS
  Filled 2023-12-23: qty 20

## 2023-12-23 MED ORDER — LEVOTHYROXINE SODIUM 50 MCG PO TABS
175.0000 ug | ORAL_TABLET | Freq: Every day | ORAL | Status: DC
  Administered 2023-12-24 – 2023-12-26 (×3): 175 ug via ORAL
  Filled 2023-12-23: qty 4
  Filled 2023-12-23: qty 2
  Filled 2023-12-23: qty 4

## 2023-12-23 MED ORDER — METOPROLOL TARTRATE 5 MG/5ML IV SOLN
5.0000 mg | Freq: Once | INTRAVENOUS | Status: AC
  Administered 2023-12-23: 5 mg via INTRAVENOUS
  Filled 2023-12-23: qty 5

## 2023-12-23 MED ORDER — ONDANSETRON HCL 4 MG/2ML IJ SOLN: 4.0000 mg | Freq: Four times a day (QID) | INTRAMUSCULAR | Status: DC | PRN

## 2023-12-23 MED ORDER — METOPROLOL TARTRATE 5 MG/5ML IV SOLN
5.0000 mg | INTRAVENOUS | Status: DC | PRN
  Administered 2023-12-23: 5 mg via INTRAVENOUS
  Filled 2023-12-23 (×2): qty 5

## 2023-12-23 MED ORDER — SEVELAMER CARBONATE 800 MG PO TABS
800.0000 mg | ORAL_TABLET | Freq: Three times a day (TID) | ORAL | Status: DC
  Administered 2023-12-23 – 2023-12-26 (×7): 800 mg via ORAL
  Filled 2023-12-23 (×8): qty 1

## 2023-12-23 MED ORDER — AMIODARONE HCL IN DEXTROSE 360-4.14 MG/200ML-% IV SOLN
60.0000 mg/h | INTRAVENOUS | Status: AC
  Administered 2023-12-23 (×2): 60 mg/h via INTRAVENOUS
  Filled 2023-12-23 (×2): qty 200

## 2023-12-23 MED ORDER — ONDANSETRON HCL 4 MG PO TABS: 4.0000 mg | ORAL_TABLET | Freq: Four times a day (QID) | ORAL | Status: DC | PRN

## 2023-12-23 MED ORDER — AMIODARONE HCL IN DEXTROSE 360-4.14 MG/200ML-% IV SOLN
30.0000 mg/h | INTRAVENOUS | Status: DC
  Administered 2023-12-24 (×2): 30 mg/h via INTRAVENOUS
  Filled 2023-12-23 (×2): qty 200

## 2023-12-23 MED ORDER — FUROSEMIDE 10 MG/ML IJ SOLN
120.0000 mg | INTRAVENOUS | Status: AC
  Administered 2023-12-23: 120 mg via INTRAVENOUS
  Filled 2023-12-23: qty 2

## 2023-12-23 MED ORDER — DILTIAZEM HCL 25 MG/5ML IV SOLN
10.0000 mg | Freq: Once | INTRAVENOUS | Status: AC
  Administered 2023-12-23: 10 mg via INTRAVENOUS
  Filled 2023-12-23: qty 5

## 2023-12-23 MED ORDER — SODIUM ZIRCONIUM CYCLOSILICATE 10 G PO PACK
10.0000 g | PACK | Freq: Once | ORAL | Status: AC
  Administered 2023-12-23: 10 g via ORAL
  Filled 2023-12-23: qty 1

## 2023-12-23 MED ORDER — AMIODARONE IV BOLUS ONLY 150 MG/100ML
INTRAVENOUS | Status: AC
  Filled 2023-12-23: qty 100

## 2023-12-23 MED ORDER — SODIUM CHLORIDE 0.9% FLUSH: 3.0000 mL | INTRAVENOUS | Status: DC | PRN

## 2023-12-23 MED ORDER — PREGABALIN 50 MG PO CAPS
100.0000 mg | ORAL_CAPSULE | Freq: Every day | ORAL | Status: DC
  Administered 2023-12-23 – 2023-12-26 (×4): 100 mg via ORAL
  Filled 2023-12-23 (×4): qty 2

## 2023-12-23 MED ORDER — DILTIAZEM HCL-DEXTROSE 125-5 MG/125ML-% IV SOLN (PREMIX)
5.0000 mg/h | INTRAVENOUS | Status: DC
  Filled 2023-12-23 (×3): qty 125

## 2023-12-23 MED ORDER — ACETAMINOPHEN 325 MG PO TABS: 650.0000 mg | ORAL_TABLET | Freq: Four times a day (QID) | ORAL | Status: DC | PRN

## 2023-12-23 MED ORDER — HEPARIN SODIUM (PORCINE) 5000 UNIT/ML IJ SOLN
5000.0000 [IU] | Freq: Three times a day (TID) | INTRAMUSCULAR | Status: DC
  Administered 2023-12-23: 5000 [IU] via SUBCUTANEOUS
  Filled 2023-12-23: qty 1

## 2023-12-23 MED ORDER — AMIODARONE LOAD VIA INFUSION
150.0000 mg | Freq: Once | INTRAVENOUS | Status: AC
  Administered 2023-12-23: 150 mg via INTRAVENOUS
  Filled 2023-12-23: qty 83.34

## 2023-12-23 MED ORDER — SODIUM CHLORIDE 0.9 % IV SOLN
2.0000 g | INTRAVENOUS | Status: DC
  Administered 2023-12-24 – 2023-12-25 (×2): 2 g via INTRAVENOUS
  Filled 2023-12-23 (×3): qty 20

## 2023-12-23 MED ORDER — SODIUM CHLORIDE 0.9% FLUSH
3.0000 mL | Freq: Two times a day (BID) | INTRAVENOUS | Status: DC
  Administered 2023-12-23 – 2023-12-26 (×6): 3 mL via INTRAVENOUS

## 2023-12-23 NOTE — Assessment & Plan Note (Addendum)
 Blood pressure mildly elevated -Resuming home hydralazine  -Keep holding amlodipine  for concern of acute CHF-we will resume after echocardiogram

## 2023-12-23 NOTE — ED Provider Notes (Signed)
 [facility] Provider Note    Event Date/Time   First MD Initiated Contact with Patient 12/23/23 [phone]     (approximate)   History   Shortness of Breath   HPI  Gregory Crane is a 53 year old male with history of T2DM, ESRD, HTN, A-fib presenting to the emergency department for evaluation of shortness of breath.  Patient reports he typically attends dialysis on Tuesday and Thursday, routinely skips Saturday for lifestyle optimization.  Completed his normal Thursday session.  Last night had onset of shortness of breath.  Went to dialysis today and was found to have O2 sats in the 70s on room air, 88% on 6 L, placed on CPAP by EMS with improvement to the low 90s.  He was directed to the ER, did not receive any dialysis today.  No fevers or known sick contacts.    Physical Exam   Triage Vital Signs: ED Triage Vitals  Encounter Vitals Group     BP 12/23/23 0808 (!) 161/112     Systolic BP Percentile --      Diastolic BP Percentile --      Pulse Rate 12/23/23 0808 (!) 119     Resp 12/23/23 0808 (!) 26     Temp 12/23/23 0817 (!) 97.5 F (36.4 C)     Temp Source 12/23/23 0817 Oral     SpO2 12/23/23 0808 96 %     Weight 12/23/23 0807 250 lb (113.4 kg)     Height 12/23/23 0807 6' (1.829 m)     Head Circumference --      Peak Flow --      Pain Score 12/23/23 0805 0     Pain Loc --      Pain Education --      Exclude from Growth Chart --     Most recent vital signs: Vitals:   12/23/23 1246 12/23/23 1400  BP: (!) 163/137 115/87  Pulse: (!) 148 64  Resp: (!) 21 (!) 26  Temp:    SpO2: 91% 91%     General: Awake, interactive  CV:  Tachycardic, normal peripheral perfusion Resp:  Tachypneic with labored respirations, improved on BiPAP, coarse lung sounds significantly worse on the right Abd:  Nondistended. Neuro:  Symmetric facial movement, fluid speech   ED Results / Procedures / Treatments   Labs (all labs ordered are listed, but only abnormal  results are displayed) Labs Reviewed  BRAIN NATRIURETIC PEPTIDE - Abnormal; Notable for the following components:      Result Value   B Natriuretic Peptide 1,683.5 (*)    All other components within normal limits  CBC WITH DIFFERENTIAL/PLATELET - Abnormal; Notable for the following components:   WBC 11.2 (*)    RBC 4.01 (*)    Hemoglobin 12.4 (*)    HCT 38.0 (*)    Neutro Abs 10.5 (*)    Lymphs Abs 0.3 (*)    All other components within normal limits  BASIC METABOLIC PANEL WITH GFR - Abnormal; Notable for the following components:   Potassium 6.2 (*)    CO2 19 (*)    Glucose, Bld 226 (*)    BUN 88 (*)    Creatinine, Ser 10.42 (*)    Calcium  7.6 (*)    GFR, Estimated 5 (*)    All other components within normal limits  D-DIMER, QUANTITATIVE - Abnormal; Notable for the following components:   D-Dimer, Quant 0.87 (*)    All other components within normal limits  HEPATIC FUNCTION PANEL - Abnormal; Notable for the following components:   Total Protein 8.4 (*)    All other components within normal limits  TROPONIN I (HIGH SENSITIVITY) - Abnormal; Notable for the following components:   Troponin I (High Sensitivity) 21 (*)    All other components within normal limits  TROPONIN I (HIGH SENSITIVITY) - Abnormal; Notable for the following components:   Troponin I (High Sensitivity) 25 (*)    All other components within normal limits  CULTURE, BLOOD (ROUTINE X 2)  CULTURE, BLOOD (ROUTINE X 2)  EXPECTORATED SPUTUM ASSESSMENT W GRAM STAIN, RFLX TO RESP C  RESPIRATORY PANEL BY PCR  LACTIC ACID, PLASMA  LACTIC ACID, PLASMA  HEPATITIS B SURFACE ANTIGEN  PROCALCITONIN  BLOOD GAS, VENOUS  LEGIONELLA PNEUMOPHILA SEROGP 1 UR AG  STREP PNEUMONIAE URINARY ANTIGEN     EKG EKG independently reviewed interpreted by myself (ER attending) demonstrates:  EKG demonstrates sinus tachycardia at a rate of 119, PR 105, QRS 101, QTc 477  RADIOLOGY Imaging independently reviewed and interpreted by  myself demonstrates:  CXR with bilateral airspace opacities concerning for multifocal pneumonia  Formal Radiology Read:  CT CHEST WO CONTRAST Result Date: 12/23/2023 CLINICAL DATA:  Respiratory illness.  Pneumonia. EXAM: CT CHEST WITHOUT CONTRAST TECHNIQUE: Multidetector CT imaging of the chest was performed following the standard protocol without IV contrast. RADIATION DOSE REDUCTION: This exam was performed according to the departmental dose-optimization program which includes automated exposure control, adjustment of the mA and/or kV according to patient size and/or use of iterative reconstruction technique. COMPARISON:  Chest radiograph dated 12/23/2023. FINDINGS: Evaluation of this exam is limited in the absence of intravenous contrast. Cardiovascular: Top-normal cardiac size. No pericardial effusion. Three-vessel coronary vascular calcification. Mild atherosclerotic calcification of the thoracic aorta. No aneurysmal dilatation. The central pulmonary arteries are grossly unremarkable. Mediastinum/Nodes: No obvious hilar or mediastinal adenopathy. Evaluation however is limited in the absence of intravenous contrast and due to consolidative changes of the lungs. The esophagus is grossly unremarkable. No mediastinal fluid collection. Lungs/Pleura: Small bilateral pleural effusions. Large area of bilateral pulmonary consolidation consistent with multifocal pneumonia. Follow-up to resolution recommended. No pneumothorax. The central airways are patent. Upper Abdomen: No acute abnormality. Musculoskeletal: Degenerative changes.  No acute osseous pathology. IMPRESSION: 1. Multifocal pneumonia. Follow-up to resolution recommended. 2. Small bilateral pleural effusions. 3.  Aortic Atherosclerosis (ICD10-I70.0). Electronically Signed   By: Angus Bark M.D.   On: 12/23/2023 13:49   DG Chest Portable 1 View Result Date: 12/23/2023 CLINICAL DATA:  Shortness of breath. EXAM: PORTABLE CHEST - 1 VIEW COMPARISON:   07/02/2023 FINDINGS: Heart size within normal limits. Mild pulmonary vascular congestion. Bilateral airspace opacities with greatest concentration in the right upper lung and left lung base. IMPRESSION: Bilateral airspace opacities suspicious for multifocal pneumonia. Electronically Signed   By: Elester Grim M.D.   On: 12/23/2023 08:32    PROCEDURES:  Critical Care performed: Yes, see critical care procedure note(s)  CRITICAL CARE Performed by: Claria Crofts   Total critical care time: 34 minutes  Critical care time was exclusive of separately billable procedures and treating other patients.  Critical care was necessary to treat or prevent imminent or life-threatening deterioration.  Critical care was time spent personally by me on the following activities: development of treatment plan with patient and/or surrogate as well as nursing, discussions with consultants, evaluation of patient's response to treatment, examination of patient, obtaining history from patient or surrogate, ordering and performing treatments and interventions, ordering and review  of laboratory studies, ordering and review of radiographic studies, pulse oximetry and re-evaluation of patient's condition.   Procedures   MEDICATIONS ORDERED IN ED: Medications  Chlorhexidine  Gluconate Cloth 2 % PADS 6 each (has no administration in time range)  ondansetron  (ZOFRAN ) tablet 4 mg (has no administration in time range)    Or  ondansetron  (ZOFRAN ) injection 4 mg (has no administration in time range)  sodium chloride  flush (NS) 0.9 % injection 3 mL (3 mLs Intravenous Given 12/23/23 1230)  sodium chloride  flush (NS) 0.9 % injection 3 mL (has no administration in time range)  0.9 %  sodium chloride  infusion (has no administration in time range)  acetaminophen  (TYLENOL ) tablet 650 mg (has no administration in time range)  cefTRIAXone  (ROCEPHIN ) 2 g in sodium chloride  0.9 % 100 mL IVPB (has no administration in time range)   azithromycin  (ZITHROMAX ) 500 mg in sodium chloride  0.9 % 250 mL IVPB (has no administration in time range)  metoprolol  tartrate (LOPRESSOR ) injection 5 mg (has no administration in time range)  insulin  aspart (novoLOG ) injection 0-9 Units (has no administration in time range)  diltiazem  (CARDIZEM ) 125 mg in dextrose  5% 125 mL (1 mg/mL) infusion (has no administration in time range)  apixaban  (ELIQUIS ) tablet 5 mg (has no administration in time range)  levothyroxine  (SYNTHROID ) tablet 175 mcg (has no administration in time range)  sevelamer  carbonate (RENVELA ) tablet 800 mg (has no administration in time range)  rosuvastatin  (CRESTOR ) tablet 5 mg (has no administration in time range)  sodium zirconium cyclosilicate (LOKELMA) packet 10 g (10 g Oral Given 12/23/23 1014)  insulin  aspart (novoLOG ) injection 5 Units (5 Units Intravenous Given 12/23/23 1013)    And  dextrose  50 % solution 50 mL (50 mLs Intravenous Given 12/23/23 1014)  cefTRIAXone  (ROCEPHIN ) 2 g in sodium chloride  0.9 % 100 mL IVPB (0 g Intravenous Stopped 12/23/23 1105)  azithromycin  (ZITHROMAX ) 500 mg in sodium chloride  0.9 % 250 mL IVPB (0 mg Intravenous Stopped 12/23/23 1229)  metoprolol  tartrate (LOPRESSOR ) injection 5 mg (5 mg Intravenous Given 12/23/23 1254)  diltiazem  (CARDIZEM ) injection 10 mg (10 mg Intravenous Given 12/23/23 1416)     IMPRESSION / MDM / ASSESSMENT AND PLAN / ED COURSE  I reviewed the triage vital signs and the nursing notes.  Differential diagnosis includes, but is not limited to, pneumonia, volume overload, pneumothorax, PE, ACS  Patient's presentation is most consistent with acute presentation with potential threat to life or bodily function.  53 year old male presenting with shortness of breath and hypoxia.  Arrives on CPAP, transition to BiPAP with improvement in work of breathing.  Will obtain labs, x-Ceferino Lang, EKG to further evaluate.  Clinical Course as of 12/23/23 1534  Tue Dec 23, 2023  0948 Labs  demonstrate hyperkalemia with potassium of 6.2 without EKG changes, elevated BUN and creatinine.  BNP significantly elevated.  Mild leukocytosis.  D-dimer slightly elevated, will defer further evaluation for to admission team. Will discuss with nephrology.  [NR]  1125 Case discussed with Dr. Rhesa Celeste.  He will arrange dialysis for the patient.  Patient does additionally have x-Karmyn Lowman concerning for pneumonia with associated tachycardia, tachypnea, leukocytosis meeting SIRS criteria.  In the setting of this, sepsis orders were initiated with empiric Rocephin , azithromycin .  Temporizing treatment for hyperkalemia ordered.  Will reach out to hospitalist team to discuss admission. [NR]  1135 Case discussed with Dr. Daisey Dryer. He will evaluate for anticipated admission.  [NR]    Clinical Course User Index [NR] Claria Crofts, MD  FINAL CLINICAL IMPRESSION(S) / ED DIAGNOSES   Final diagnoses:  Community acquired pneumonia, unspecified laterality     Rx / DC Orders   ED Discharge Orders     None        Note:  This document was prepared using Dragon voice recognition software and may include unintentional dictation errors.   Claria Crofts, MD 12/23/23 [phone]

## 2023-12-23 NOTE — Progress Notes (Signed)
 Pt down to unit escorted by RT and ED RN.  Pt placed on our monitor and showing HR tachy at 170's -180's metoprolol  ordered 5mg . Pt HR ranging from 70's to 180's.  NP at bedside.  Waited around 15 min to see if we would be able to dialyze pt but HR never stabilized.  Order made to send pt back to ED  RT and dialysis RN escorted pt back to ED.

## 2023-12-23 NOTE — Progress Notes (Signed)
 OT Cancellation Note  Patient Details Name: Gregory Crane MRN: [id number] DOB: 1971-01-08   Cancelled Treatment:    Reason Eval/Treat Not Completed: Patient not medically ready;Medical issues which prohibited therapy. Pt admitted to the ICU this date, per protocol OT orders completed at this time.  Will attempt to see pt at a future date/time as medically appropriate upon receipt of new OT orders.     Shadavia Dampier R., MPH, MS, OTR/L ascom [phone] 12/23/23, 4:06 PM

## 2023-12-23 NOTE — Assessment & Plan Note (Signed)
 Patient initially presented with concern of A-fib with RVR, cardiology was consulted and it looks like he was having SVT/AT Started on amiodarone infusion-currently in sinus rhythm -Continue with Eliquis  -Continue with amiodarone-give is planning to resume metoprolol  once respiratory status improved

## 2023-12-23 NOTE — Progress Notes (Signed)
 PT Cancellation Note  Patient Details Name: Gregory Crane MRN: [id number] DOB: March 13, 1971   Cancelled Treatment:    Reason Eval/Treat Not Completed: Medical issues which prohibited therapy: Pt admitted to the ICU this date, per protocol PT orders completed at this time.  Will attempt to see pt at a future date/time as medically appropriate upon receipt of new PT orders.      Lavenia Post PT, DPT 12/23/23, 3:59 PM

## 2023-12-23 NOTE — ED Triage Notes (Signed)
 Pt to ED via [facility] from dialysis for c/o SOB that began last night. Pt O2 70s on room air, 88% on 6L nasal cannula, placed on Cpap by EMS, improved to 93%.  Cbg 264 Denies pain

## 2023-12-23 NOTE — Assessment & Plan Note (Signed)
 Remote history of CVA w/ chronic L sided weakness.  At baseline. Cont home statin Patient is on Eliquis  instead of aspirin 

## 2023-12-23 NOTE — Assessment & Plan Note (Addendum)
 Pneumonia  Volume Overload  Decompensated respiratory status requiring BiPAP in the setting of volume overload associated with ESRD and partial dialysis regimen as well as concern for possible multifocal pneumonia.  Procalcitonin at 0.23 Predominantly HD dependent volume status  Had 1 session of dialysis-planning for another session later today. Also ordered CTA due to difficulty weaning him from BiPAP-no baseline oxygen use.  Preliminary blood cultures negative. IV Rocephin  azithromycin  for infectious coverage Continue NIPPV Pending urine studies for strep pneumo and Legionella

## 2023-12-23 NOTE — H&P (Addendum)
 History and Physical    Patient: Gregory Crane ZOX:[id number] DOB: 1970/08/29 DOA: 12/23/2023 DOS: the patient was seen and examined on 12/23/2023 PCP: Gregory Fenton, MD  Patient coming from: Home  Chief Complaint:  Chief Complaint  Patient presents with   Shortness of Breath   HPI: [name] is a 53 y.o. male with medical history significant of obesity, type 2 Diabetes, ESRD on HD MWF, HTN, hypothyroidism, CVA, bells palsy presenting w/ acute resp failure with hypoxia, volume overload, PNA, afib w/ RVR. Pt reports increased WOB over the past 1 to 2 days.  Patient reports only partial compliance with hemodialysis regimen.  Patient states he only goes on Tuesdays and Thursdays but skip Saturdays.  Went for dialysis today with significant shortness of breath.  Minimal to mild chest pain.  No abdominal pain.  Mild nausea.  Positive cough and increased work of breathing.  Significant swelling.  Still making some urine.  Took home Lasix  with minimal improvement in urine output. Presented to the ER afebrile, heart rate into the 130s, respirations in the 20s, blood pressure 110s to 180s over 100s.  Satting 92% or more on BiPAP.  White count 11.2, hemoglobin 12.4, platelets 194, creatinine 10.4.  Glucose 226.  Potassium 6.2.  Troponin 20s.  Chest x-ray bilateral airspace opacities suspicious for multifocal pneumonia. Review of Systems: As mentioned in the history of present illness. All other systems reviewed and are negative. Past Medical History:  Diagnosis Date   Allergy    Anemia    Bell's palsy    Diabetes mellitus without complication (HCC)    diet controlled   Fall 07/2023   Hypertension    Hypothyroidism    Kidney stones    Pseudotumor cerebri    Stroke [facility])    Past Surgical History:  Procedure Laterality Date   COLONOSCOPY WITH PROPOFOL  N/A 03/30/2020   Procedure: COLONOSCOPY WITH PROPOFOL ;  Surgeon: Shane Darling, MD;  Location: [facility] ENDOSCOPY;  Service: Endoscopy;   Laterality: N/A;   LOOP RECORDER INSERTION N/A 01/27/2018   Procedure: LOOP RECORDER INSERTION;  Surgeon: Verona Goodwill, MD;  Location: [facility] INVASIVE CV LAB;  Service: Cardiovascular;  Laterality: N/A;   LUMBAR PUNCTURE     as child   NO PAST SURGERIES     REMOVAL OF A DIALYSIS CATHETER     TEE WITHOUT CARDIOVERSION N/A 01/07/2018   Procedure: TRANSESOPHAGEAL ECHOCARDIOGRAM (TEE);  Surgeon: Gollan, Timothy J, MD;  Location: [facility] ORS;  Service: Cardiovascular;  Laterality: N/A;   Social History:  reports that he has never smoked. He has never used smokeless tobacco. He reports that he does not drink alcohol and does not use drugs.  No Known Allergies  Family History  Problem Relation Age of Onset   Breast cancer Mother    Diabetes Father    Diabetes Sister    Arthritis Maternal Grandmother    Diabetes Maternal Grandmother     Prior to Admission medications   Medication Sig Start Date End Date Taking? Authorizing Provider  amLODipine  (NORVASC ) 10 MG tablet TAKE 1 TABLET(10 MG) BY MOUTH DAILY 11/03/23   Gregory Fenton, MD  apixaban  (ELIQUIS ) 5 MG TABS tablet Take 1 tablet (5 mg total) by mouth 2 (two) times daily. 07/03/23   Garrison Kanner, MD  furosemide  (LASIX ) 40 MG tablet Take 1 tablet (40 mg total) by mouth 2 (two) times daily. 07/03/23   Garrison Kanner, MD  hydrALAZINE  (APRESOLINE ) 50 MG tablet Take 0.5 tablets (25 mg total) by  mouth 2 (two) times daily. Home med. 07/03/23   Garrison Kanner, MD  levETIRAcetam  (KEPPRA ) 500 MG tablet Take 1 tablet (500 mg total) by mouth 2 (two) times daily for 7 days. 08/14/23 08/21/23  Lynnda Sas, MD  levothyroxine  (SYNTHROID ) 175 MCG tablet TAKE 1 TABLET (175 MCG) BY MOUTH ONCE DAILY BEFORE BREAKFAST. 10/09/23   Gregory Fenton, MD  metoprolol  tartrate (LOPRESSOR ) 25 MG tablet Take 1 tablet (25 mg total) by mouth 2 (two) times daily. 07/03/23   Garrison Kanner, MD  pregabalin  (LYRICA ) 100 MG capsule Take 1 capsule (100 mg total) by mouth daily. 09/23/23   Gregory Fenton, MD  RENVELA  800 MG tablet Take 800 mg by mouth 3 (three) times daily. 04/23/22   [provider]  rosuvastatin  (CRESTOR ) 5 MG tablet Take 5 mg by mouth daily.    [provider]    Physical Exam: Vitals:   12/23/23 1200 12/23/23 1241 12/23/23 1246 12/23/23 1400  BP: (!) 149/110 (!) 156/73 (!) 163/137 115/87  Pulse: 70 (!) 132 (!) 148 64  Resp: (!) 26 (!) 27 (!) 21 (!) 26  Temp:  (!) 97.2 F (36.2 C)    TempSrc:  Oral    SpO2: 92% 90% 91% 91%  Weight:      Height:       Physical Exam Constitutional:      Appearance: He is obese.     Comments: BIPAP in place  Mild increased WOB    HENT:     Head: Normocephalic.     Nose: Nose normal.     Mouth/Throat:     Mouth: Mucous membranes are moist.  Eyes:     Pupils: Pupils are equal, round, and reactive to light.  Cardiovascular:     Rate and Rhythm: Normal rate and regular rhythm.  Pulmonary:     Effort: Pulmonary effort is normal.     Breath sounds: Rales present.  Abdominal:     General: Bowel sounds are normal.  Musculoskeletal:        General: Normal range of motion.     Right lower leg: Edema present.     Left lower leg: Edema present.  Neurological:     General: No focal deficit present.  Psychiatric:        Mood and Affect: Mood normal.     Data Reviewed:  There are no new results to review at this time.  CT CHEST WO CONTRAST CLINICAL DATA:  Respiratory illness.  Pneumonia.  EXAM: CT CHEST WITHOUT CONTRAST  TECHNIQUE: Multidetector CT imaging of the chest was performed following the standard protocol without IV contrast.  RADIATION DOSE REDUCTION: This exam was performed according to the departmental dose-optimization program which includes automated exposure control, adjustment of the mA and/or kV according to patient size and/or use of iterative reconstruction technique.  COMPARISON:  Chest radiograph dated 12/23/2023.  FINDINGS: Evaluation of this exam is limited in  the absence of intravenous contrast.  Cardiovascular: Top-normal cardiac size. No pericardial effusion. Three-vessel coronary vascular calcification. Mild atherosclerotic calcification of the thoracic aorta. No aneurysmal dilatation. The central pulmonary arteries are grossly unremarkable.  Mediastinum/Nodes: No obvious hilar or mediastinal adenopathy. Evaluation however is limited in the absence of intravenous contrast and due to consolidative changes of the lungs. The esophagus is grossly unremarkable. No mediastinal fluid collection.  Lungs/Pleura: Small bilateral pleural effusions. Large area of bilateral pulmonary consolidation consistent with multifocal pneumonia. Follow-up to resolution recommended. No pneumothorax. The central airways are patent.  Upper  Abdomen: No acute abnormality.  Musculoskeletal: Degenerative changes.  No acute osseous pathology.  IMPRESSION: 1. Multifocal pneumonia. Follow-up to resolution recommended. 2. Small bilateral pleural effusions. 3.  Aortic Atherosclerosis (ICD10-I70.0).  Electronically Signed   By: Angus Bark M.D.   On: 12/23/2023 13:49 DG Chest Portable 1 View CLINICAL DATA:  Shortness of breath.  EXAM: PORTABLE CHEST - 1 VIEW  COMPARISON:  07/02/2023  FINDINGS: Heart size within normal limits. Mild pulmonary vascular congestion.  Bilateral airspace opacities with greatest concentration in the right upper lung and left lung base.  IMPRESSION: Bilateral airspace opacities suspicious for multifocal pneumonia.  Electronically Signed   By: Elester Grim M.D.   On: 12/23/2023 08:32  Lab Results  Component Value Date   WBC 11.2 (H) 12/23/2023   HGB 12.4 (L) 12/23/2023   HCT 38.0 (L) 12/23/2023   MCV 94.8 12/23/2023   PLT 194 12/23/2023   Last metabolic panel Lab Results  Component Value Date   GLUCOSE 226 (H) 12/23/2023   NA 136 12/23/2023   K 6.2 (H) 12/23/2023   CL 107 12/23/2023   CO2 19 (L) 12/23/2023    BUN 88 (H) 12/23/2023   CREATININE 10.42 (H) 12/23/2023   GFRNONAA 5 (L) 12/23/2023   CALCIUM  7.6 (L) 12/23/2023   PHOS 6.8 (H) 05/01/2023   PROT 8.4 (H) 12/23/2023   ALBUMIN 4.4 12/23/2023   LABGLOB 3.0 09/30/2013   AGRATIO 1.5 09/30/2013   BILITOT 0.8 12/23/2023   ALKPHOS 79 12/23/2023   AST 17 12/23/2023   ALT 15 12/23/2023   ANIONGAP 10 12/23/2023    Assessment and Plan: * Acute respiratory failure with hypoxia (HCC) Pneumonia  Volume Overload  Decompensated respiratory status requiring BiPAP in the setting of volume overload associated with ESRD and partial dialysis regimen as well as concern for possible multifocal pneumonia Predominantly HD dependent volume status  Pending hemodialysis IV Rocephin  azithromycin  for infectious coverage Continue NIPPV Blood and respiratory cultures in the setting of pneumonia CT of the chest to better assess lung pathology Monitor  ESRD on hemodialysis (HCC) Baseline ESRD with hemodialysis partial compliance Only going to dialysis on Tuesdays and Thursdays Current presentation with acute volume overload requiring BiPAP HD dependent volume status pending dialysis today Discussed importance of TTS hemodialysis Patient expressed understanding  History of CVA (cerebrovascular accident) Remote history of CVA w/ chronic L sided weakness.  At baseline. Cont home ASA and statin  Essential hypertension BP stable Titrate home regimen with hemodialysis  Atrial fibrillation with rapid ventricular response (HCC) Baseline A-fib with heart rate into the 130s EKG pending Will give 10 mg IV diltiazem  x 1 Start dilt drip as appropriate   Type 2 diabetes mellitus, with long-term current use of insulin  (HCC) Blood sugar 220s SSI A1c    Greater than 50% was spent in counseling and coordination of care with patient Critical care time: 60 minutes or more.    Advance Care Planning:   Code Status: Full Code   Consults: Cardiology   Family  Communication: No family at the bedside   Severity of Illness: The appropriate patient status for this patient is OBSERVATION. Observation status is judged to be reasonable and necessary in order to provide the required intensity of service to ensure the patient's safety. The patient's presenting symptoms, physical exam findings, and initial radiographic and laboratory data in the context of their medical condition is felt to place them at decreased risk for further clinical deterioration. Furthermore, it is anticipated that the patient will  be medically stable for discharge from the hospital within 2 midnights of admission.   Author: Corrinne Din, MD 12/23/2023 3:16 PM  For on call review [email or web address].

## 2023-12-23 NOTE — Progress Notes (Signed)
 [facility]  ROUNDING NOTE   Subjective:   Gregory Crane is a 53 y.o. with past medical conditions including diabetes, hypertension, CVA, and end stage renal disease. Patient presents to the emergency department with increased shortness of breath. He has been admitted for Acute respiratory failure with hypoxia (HCC) [J96.01]  Patient is known to our practice and currently receives outpatient dialysis at [facility] supervised by Dr. Rhesa Celeste.  According to outpatient clinic, for the past month the patient has only been receiving treatments twice a week.  When questioned about this, the patient states that he needs to have some quality of life.  Patient states he has been having progressive shortness of breath for couple days.  Reports decreased urine output, states he has urgency without results.  Currently seen at bedside on BiPAP.  Alert and oriented.  Generalized edema noted.  Labs on ED arrival concerning for potassium 6.2, serum bicarb 19, glucose 226, BUN 88, creatinine 10.42 with GFR 5, calcium  7.6, BNP greater than 1600, troponin 21, white count 11.2 with hemoglobin 12.4.  Chest x-ray suspicious for multifocal pneumonia.  We have been consulted to manage dialysis needs during this admission.   Objective:  Vital signs in last 24 hours:  Temp:  [97.2 F (36.2 C)-97.5 F (36.4 C)] 97.2 F (36.2 C) (04/29 1241) Pulse Rate:  [64-154] 64 (04/29 1400) Resp:  [14-27] 26 (04/29 1400) BP: (115-189)/(73-137) 115/87 (04/29 1400) SpO2:  [90 %-97 %] 91 % (04/29 1400) FiO2 (%):  [40 %] 40 % (04/29 1400) Weight:  [113.4 kg] 113.4 kg (04/29 0807)  Weight change:  Filed Weights   12/23/23 0807  Weight: 113.4 kg    Intake/Output: No intake/output data recorded.   Intake/Output this shift:  No intake/output data recorded.  Physical Exam: General: Ill-appearing, restless  Head: Normocephalic, atraumatic.   Eyes: Anicteric  Lungs:  BiPAP in place  Heart: Irregular rate and  rhythm  Abdomen:  Soft, nontender, obese  Extremities: 3+ peripheral edema.  Neurologic: Alert and oriented, moving all four extremities  Skin: No lesions  Access: aVF    Basic Metabolic Panel: Recent Labs  Lab 12/23/23 0814  NA 136  K 6.2*  CL 107  CO2 19*  GLUCOSE 226*  BUN 88*  CREATININE 10.42*  CALCIUM  7.6*    Liver Function Tests: Recent Labs  Lab 12/23/23 0813  AST 17  ALT 15  ALKPHOS 79  BILITOT 0.8  PROT 8.4*  ALBUMIN 4.4   No results for input(s): "LIPASE", "AMYLASE" in the last 168 hours. No results for input(s): "AMMONIA" in the last 168 hours.  CBC: Recent Labs  Lab 12/23/23 0814  WBC 11.2*  NEUTROABS 10.5*  HGB 12.4*  HCT 38.0*  MCV 94.8  PLT 194    Cardiac Enzymes: No results for input(s): "CKTOTAL", "CKMB", "CKMBINDEX", "TROPONINI" in the last 168 hours.  BNP: Invalid input(s): "POCBNP"  CBG: No results for input(s): "GLUCAP" in the last 168 hours.  Microbiology: Results for orders placed or performed during the hospital encounter of 07/02/23  MRSA Next Gen by PCR, Nasal     Status: None   Collection Time: 07/03/23  2:35 AM   Specimen: Nasal Mucosa; Nasal Swab  Result Value Ref Range Status   MRSA by PCR Next Gen NOT DETECTED NOT DETECTED Final    Comment: (NOTE) The GeneXpert MRSA Assay (FDA approved for NASAL specimens only), is one component of a comprehensive MRSA colonization surveillance program. It is not intended to diagnose  MRSA infection nor to guide or monitor treatment for MRSA infections. Test performance is not FDA approved in patients less than 32 years old. Performed at [facility], [address] [place], [place] [address]    *Note: Due to a large number of results and/or encounters for the requested time period, some results have not been displayed. A complete set of results can be found in Results Review.    Coagulation Studies: No results for input(s): "LABPROT", "INR" in the last 72  hours.  Urinalysis: No results for input(s): "COLORURINE", "LABSPEC", "PHURINE", "GLUCOSEU", "HGBUR", "BILIRUBINUR", "KETONESUR", "PROTEINUR", "UROBILINOGEN", "NITRITE", "LEUKOCYTESUR" in the last 72 hours.  Invalid input(s): "APPERANCEUR"    Imaging: CT CHEST WO CONTRAST Result Date: 12/23/2023 CLINICAL DATA:  Respiratory illness.  Pneumonia. EXAM: CT CHEST WITHOUT CONTRAST TECHNIQUE: Multidetector CT imaging of the chest was performed following the standard protocol without IV contrast. RADIATION DOSE REDUCTION: This exam was performed according to the departmental dose-optimization program which includes automated exposure control, adjustment of the mA and/or kV according to patient size and/or use of iterative reconstruction technique. COMPARISON:  Chest radiograph dated 12/23/2023. FINDINGS: Evaluation of this exam is limited in the absence of intravenous contrast. Cardiovascular: Top-normal cardiac size. No pericardial effusion. Three-vessel coronary vascular calcification. Mild atherosclerotic calcification of the thoracic aorta. No aneurysmal dilatation. The central pulmonary arteries are grossly unremarkable. Mediastinum/Nodes: No obvious hilar or mediastinal adenopathy. Evaluation however is limited in the absence of intravenous contrast and due to consolidative changes of the lungs. The esophagus is grossly unremarkable. No mediastinal fluid collection. Lungs/Pleura: Small bilateral pleural effusions. Large area of bilateral pulmonary consolidation consistent with multifocal pneumonia. Follow-up to resolution recommended. No pneumothorax. The central airways are patent. Upper Abdomen: No acute abnormality. Musculoskeletal: Degenerative changes.  No acute osseous pathology. IMPRESSION: 1. Multifocal pneumonia. Follow-up to resolution recommended. 2. Small bilateral pleural effusions. 3.  Aortic Atherosclerosis (ICD10-I70.0). Electronically Signed   By: Angus Bark M.D.   On: 12/23/2023 13:49    DG Chest Portable 1 View Result Date: 12/23/2023 CLINICAL DATA:  Shortness of breath. EXAM: PORTABLE CHEST - 1 VIEW COMPARISON:  07/02/2023 FINDINGS: Heart size within normal limits. Mild pulmonary vascular congestion. Bilateral airspace opacities with greatest concentration in the right upper lung and left lung base. IMPRESSION: Bilateral airspace opacities suspicious for multifocal pneumonia. Electronically Signed   By: Elester Grim M.D.   On: 12/23/2023 08:32     Medications:    sodium chloride      [START ON 12/24/2023] azithromycin      [START ON 12/24/2023] cefTRIAXone  (ROCEPHIN )  IV      Chlorhexidine  Gluconate Cloth  6 each Topical Q0600   heparin   5,000 Units Subcutaneous Q8H   insulin  aspart  0-9 Units Subcutaneous TID WC   sodium chloride  flush  3 mL Intravenous Q12H   sodium chloride , acetaminophen , metoprolol  tartrate, ondansetron  **OR** ondansetron  (ZOFRAN ) IV, sodium chloride  flush  Assessment/ Plan:  [name] is a 53 y.o.  male past medical conditions including diabetes, hypertension, CVA, and end stage renal disease. Patient presents to the emergency department with increased shortness of breath. He has been admitted for Acute respiratory failure with hypoxia (HCC) [J96.01]  [facility]/TTS/aVF  End-stage renal disease with hyperkalemia on hemodialysis.  Last treatment received on Saturday.  Noncompliance with scheduled dialysis for the past month.  Severely volume overloaded.  Potassium 6.2, patient received Lokelma and shifting measures in emergency department.  Will continue to treat with dialysis.  Will perform dialysis  today with UF goal 2.5 to 3 L as tolerated.  Patient will likely require daily dialysis to manage volume status.  *Unable to perform hemodialysis today due to patient is A-fib with RVR.  Ordered IV metoprolol  5 mg without relief of symptoms.  Patient return to emergency department, primary team notified.  Discussions underway for possible  diltiazem  drip.  Will continue to monitor and determine availability of dialysis later tonight or tomorrow.  2.  Acute respiratory failure due to fluid overload and pneumonia.  Chest x-ray suspicious for bilateral multifocal pneumonia.  Also self prescribed twice weekly dialysis treatments for the past month.  Patient experiencing urgency, bladder scan ordered.  Will perform dialysis to manage volume status.  Primary team has ordered azithromycin  and ceftriaxone  prophylactically.  3.  Atrial fibrillation with RVR, heart rate ranges from 90s to 180s.  IV metoprolol  5 mg given without relief of symptoms.  Patient states he inconsistently follows with outpatient cardiology.  Primary team considering diltiazem  drip.  4. Anemia of chronic kidney disease Lab Results  Component Value Date   HGB 12.4 (L) 12/23/2023    Hemoglobin within desired range.  No need for ESA's at this time.  5. Secondary Hyperparathyroidism: with outpatient labs: Unavailable  Lab Results  Component Value Date   CALCIUM  7.6 (L) 12/23/2023   PHOS 6.8 (H) 05/01/2023    Will continue to monitor bone minerals during this admission.   LOS: 0 Viera Okonski 4/29/20252:54 PM

## 2023-12-23 NOTE — Sepsis Progress Note (Signed)
 eLink is following this Code Sepsis.

## 2023-12-23 NOTE — Plan of Care (Signed)
  Problem: Coping: Goal: Ability to adjust to condition or change in health will improve Outcome: Progressing   Problem: Health Behavior/Discharge Planning: Goal: Ability to identify and utilize available resources and services will improve Outcome: Progressing Goal: Ability to manage health-related needs will improve Outcome: Progressing   Problem: Metabolic: Goal: Ability to maintain appropriate glucose levels will improve Outcome: Progressing   Problem: Nutritional: Goal: Progress toward achieving an optimal weight will improve Outcome: Progressing   Problem: Skin Integrity: Goal: Risk for impaired skin integrity will decrease Outcome: Progressing   Problem: Tissue Perfusion: Goal: Adequacy of tissue perfusion will improve Outcome: Progressing   Problem: Activity: Goal: Ability to tolerate increased activity will improve Outcome: Progressing   Problem: Respiratory: Goal: Ability to maintain adequate ventilation will improve Outcome: Progressing Goal: Ability to maintain a clear airway will improve Outcome: Progressing

## 2023-12-23 NOTE — Progress Notes (Signed)
 OT Cancellation Note  Patient Details Name: Jekai Barberi MRN: [id number] DOB: 1971-08-11   Cancelled Treatment:    Reason Eval/Treat Not Completed: Patient at procedure or test/ unavailable. Consult received, chart reviewed. Pt receiving dialysis. Will attempt OT evaluation at later date/time as pt is appropriate.   Abbagail Scaff R., MPH, MS, OTR/L ascom [phone] 12/23/23, 1:32 PM

## 2023-12-23 NOTE — Progress Notes (Signed)
 Rt assisted with patient transport from ER to ICU with no complications. Pt transported on V60 bipap.

## 2023-12-23 NOTE — ED Notes (Signed)
 Report given to dialysis.

## 2023-12-23 NOTE — Consult Note (Signed)
 CODE SEPSIS - PHARMACY COMMUNICATION  **Broad Spectrum Antibiotics should be administered within 1 hour of Sepsis diagnosis**  Time Code Sepsis Called/Page Received: 1914  Antibiotics Ordered: azithromycin  and ceftriaxone   Time of 1st antibiotic administration: Ceftriaxone  at 10:24  Additional action taken by pharmacy: none  If necessary, Name of Provider/Nurse Contacted: n/a   Roshelle Traub Rodriguez-Guzman PharmD, BCPS 12/23/2023 10:06 AM

## 2023-12-23 NOTE — Consult Note (Addendum)
 Cardiology Consultation   Patient ID: Gregory Crane MRN: [id number]; DOB: 1971/02/10  Admit date: 12/23/2023 Date of Consult: 12/23/2023  PCP:  Dellar Fenton, MD   [facility] Providers Cardiologist:  Wendie Hamburg, MD        Patient Profile:   Gregory Crane is a 53 y.o. male with a hx of T2DM, cirrhosis, pretension, history of stroke, and atrial fibrillation who is being seen 12/23/2023 for the evaluation of volume overload and sinus tachycardia at the request of Dr. Daisey Dryer.  History of Present Illness:   Gregory Crane has a history of cryptogenic stroke in 2019. In 2019, echo showed EF 60 to 65%, G1 DD, normal RV function, no significant valvular disease. Loop recorder placement for 3 years showing no significant arrhythmias/atrial fibrillation, loop removed 12/2021.  Hospitalized 06/2023 after hemodialysis team noted him to be in atrial fibrillation with RVR.  Troponin was mildly elevated at 236, felt to be a supply/demand mismatch in the setting of ESRD and atrial fibrillation with RVR.  Echo showed EF 50 to 55% with no RWMA.  He converted to sinus rhythm on diltiazem  drip and oral metoprolol  every 6 hours.  He was started on Eliquis  5 mg twice daily for CHA2DS2-VASc of at least 4. Metoprolol  was reduced to tartrate 25 mg twice daily in the setting of bradycardia. It was recommended that he follow-up with EP for consideration of antiarrhythmic versus ablation, although it does not appear that this appointment was completed.  Patient was lost to follow-up in our clinic.  In the ED, BP 161/112, HR 119, RR 26, T97.5 F, with SpO2 96% on CPAP.  CMP with K6.2, glucose 226, BUN 88, CR 10.42, and Ca 7.6.  CBC with WBC 11.2.  BNP 1683.  Troponin mildly elevated at 21, 25.  CT chest shows multifocal pneumonia with small bilateral pleural effusions.  Ms. Macak reports that he was feeling well until yesterday evening around 10 PM when he "hit a wall."  He suddenly felt extremely  short of breath, worsened lying flat.  Leaning forward and hunching over slightly seem to help his breathing for a short period.  Walking slowly" getting the blood flowing" also seem to help his breathing a little bit.  He denies having any chest pain, palpitations, or lightheadedness.  He has noticed some leg swelling over the last few days and suspects that he has put on weight though he does not know how much.  He is intermittently compliant with medications and also does not adhere to his fluid restriction on a consistent basis.  He last dialyzed on Thursday (5 days ago) as he does not go on Saturdays anymore due to his quality of life.  He typically feels quite poorly after hemodialysis and does not think that he could tolerate doing this 3 days a week again.  Past Medical History:  Diagnosis Date   Allergy    Anemia    Bell's palsy    Diabetes mellitus without complication (HCC)    diet controlled   Fall 07/2023   Hypertension    Hypothyroidism    Kidney stones    Pseudotumor cerebri    Stroke [facility])     Past Surgical History:  Procedure Laterality Date   COLONOSCOPY WITH PROPOFOL  N/A 03/30/2020   Procedure: COLONOSCOPY WITH PROPOFOL ;  Surgeon: Shane Darling, MD;  Location: [facility] ENDOSCOPY;  Service: Endoscopy;  Laterality: N/A;   LOOP RECORDER INSERTION N/A 01/27/2018   Procedure: LOOP RECORDER INSERTION;  Surgeon: Verona Goodwill, MD;  Location: [facility] INVASIVE CV LAB;  Service: Cardiovascular;  Laterality: N/A;   LUMBAR PUNCTURE     as child   NO PAST SURGERIES     REMOVAL OF A DIALYSIS CATHETER     TEE WITHOUT CARDIOVERSION N/A 01/07/2018   Procedure: TRANSESOPHAGEAL ECHOCARDIOGRAM (TEE);  Surgeon: Gollan, Timothy J, MD;  Location: [facility] ORS;  Service: Cardiovascular;  Laterality: N/A;     Inpatient Medications: Scheduled Meds:  amiodarone  150 mg Intravenous Once   apixaban   5 mg Oral BID   Chlorhexidine  Gluconate Cloth  6 each Topical Q0600   insulin  aspart  0-9 Units  Subcutaneous TID WC   [START ON 12/24/2023] levothyroxine   175 mcg Oral Q0600   pregabalin   100 mg Oral Daily   rosuvastatin   5 mg Oral Daily   sevelamer  carbonate  800 mg Oral TID WC   sodium chloride  flush  3 mL Intravenous Q12H   Continuous Infusions:  sodium chloride      amiodarone     Followed by   amiodarone     [START ON 12/24/2023] azithromycin      [START ON 12/24/2023] cefTRIAXone  (ROCEPHIN )  IV     diltiazem  (CARDIZEM ) infusion     furosemide      PRN Meds: sodium chloride , acetaminophen , metoprolol  tartrate, ondansetron  **OR** ondansetron  (ZOFRAN ) IV, sodium chloride  flush  Allergies:   No Known Allergies  Social History:   Social History   Tobacco Use   Smoking status: Never   Smokeless tobacco: Never  Vaping Use   Vaping status: Never Used  Substance Use Topics   Alcohol use: No    Comment: last use early 2023   Drug use: No      Family History:    Family History  Problem Relation Age of Onset   Breast cancer Mother    Diabetes Father    Diabetes Sister    Arthritis Maternal Grandmother    Diabetes Maternal Grandmother      ROS:  Please see the history of present illness.   Physical Exam/Data:   Vitals:   12/23/23 1241 12/23/23 1246 12/23/23 1400 12/23/23 1536  BP: (!) 156/73 (!) 163/137 115/87   Pulse: (!) 132 (!) 148 64   Resp: (!) 27 (!) 21 (!) 26   Temp: (!) 97.2 F (36.2 C)   (!) 97.3 F (36.3 C)  TempSrc: Oral     SpO2: 90% 91% 91% 90%  Weight:    122.1 kg  Height:    6' (1.829 m)   No intake or output data in the 24 hours ending 12/23/23 1640    12/23/2023    3:36 PM 12/23/2023    8:07 AM 10/13/2023    1:08 PM  Last 3 Weights  Weight (lbs) 269 lb 2.9 oz 250 lb 244 lb 12.8 oz  Weight (kg) 122.1 kg 113.399 kg 111.041 kg     Body mass index is 36.51 kg/m.  General: Obese man lying in bed, wearing BiPAP.  His sister is at the bedside. HEENT: BiPAP mask in place. Neck: Unable to assess JVP due to BiPAP mask stress and body  habitus. Vascular: Left upper extremity hemodialysis fistula present.  2+ right radial pulse. Cardiac: Tachycardia with intermittent pauses.  No murmurs. Lungs: Rhonchorous breath sounds bilaterally with faint bibasilar crackles. Abd: Soft and somewhat firm.  Nontender. Ext: Trace pretibial edema bilaterally. Skin: warm and dry  Psych: Endorses intermittent depression and remote suicidal ideation after the death of his  wife in 2021.  He states that his suicidal ideation has resolved.  EKG:  The EKG was personally reviewed and demonstrates: Sinus tachycardia with rightward axis and borderline QT prolongation. Telemetry:  Telemetry was personally reviewed and demonstrates: Initially sinus tachycardia versus atrial tachycardia in the emergency department after which more frequent supraventricular runs with brief periods of sinus rhythm developed.  Relevant CV Studies:  07/02/2023 Echo complete 1. Left ventricular ejection fraction, by estimation, is 50 to 55%. The  left ventricle has low normal function. The left ventricle has no regional  wall motion abnormalities. Left ventricular diastolic parameters are  indeterminate.   2. Right ventricular systolic function is normal. The right ventricular  size is normal. There is normal pulmonary artery systolic pressure. The  estimated right ventricular systolic pressure is 19.0 mmHg.   3. The mitral valve is normal in structure. Mild mitral valve  regurgitation. No evidence of mitral stenosis.   4. The aortic valve is tricuspid. There is mild calcification of the  aortic valve. Aortic valve regurgitation is not visualized. Aortic valve  sclerosis/calcification is present, without any evidence of aortic  stenosis.   5. The inferior vena cava is normal in size with greater than 50%  respiratory variability, suggesting right atrial pressure of 3 mmHg.   Laboratory Data:  High Sensitivity Troponin:   Recent Labs  Lab 12/23/23 0814 12/23/23 1025   TROPONINIHS 21* 25*     Chemistry Recent Labs  Lab 12/23/23 0814  NA 136  K 6.2*  CL 107  CO2 19*  GLUCOSE 226*  BUN 88*  CREATININE 10.42*  CALCIUM  7.6*  GFRNONAA 5*  ANIONGAP 10    Recent Labs  Lab 12/23/23 0813  PROT 8.4*  ALBUMIN 4.4  AST 17  ALT 15  ALKPHOS 79  BILITOT 0.8   Lipids No results for input(s): "CHOL", "TRIG", "HDL", "LABVLDL", "LDLCALC", "CHOLHDL" in the last 168 hours.  Hematology Recent Labs  Lab 12/23/23 0814  WBC 11.2*  RBC 4.01*  HGB 12.4*  HCT 38.0*  MCV 94.8  MCH 30.9  MCHC 32.6  RDW 14.0  PLT 194   Thyroid  No results for input(s): "TSH", "FREET4" in the last 168 hours.  BNP Recent Labs  Lab 12/23/23 0814  BNP 1,683.5*    DDimer  Recent Labs  Lab 12/23/23 0813  DDIMER 0.87*    Radiology/Studies:  CT CHEST WO CONTRAST Result Date: 12/23/2023 IMPRESSION: 1. Multifocal pneumonia. Follow-up to resolution recommended. 2. Small bilateral pleural effusions. 3.  Aortic Atherosclerosis (ICD10-I70.0). Electronically Signed   By: Angus Bark M.D.   On: 12/23/2023 13:49   DG Chest Portable 1 View Result Date: 12/23/2023 IMPRESSION: Bilateral airspace opacities suspicious for multifocal pneumonia. Electronically Signed   By: Elester Grim M.D.   On: 12/23/2023 08:32   Assessment and Plan:   Acute on chronic HFpEF and acute respiratory failure with hypoxia: I suspect sudden respiratory decompensation yesterday evening was brought on by acute heart failure in the setting of volume overload from Jadence Kinlaw-stage renal disease and inadequate hemodialysis as well as excess dietary fluid intake.  BNP is chronically elevated but even higher than his baseline.  Chest radiograph and CT were read as multifocal pneumonia, which certainly could be driving his symptoms as well.  I think more than anything, he will need fairly urgent hemodialysis to help remove some volume and to correct his electrolyte abnormalities.  I will also administer furosemide   120 mg IV x 1 to help mobilize fluid, given  that Gregory Crane still makes some urine.  I agree with empiric antimicrobial therapy, as you are doing.  BiPAP should be weaned as tolerated.  I will repeat an echo to reassess LVEF and exclude significant atrial regurgitation.  SVT and atrial fibrillation/flutter: Gregory Crane has a history of atrial fibrillation/flutter, though his EKG and telemetry here is more consistent with frequent SVT/AT.  He has been on apixaban  at home, though he admits to missing some doses.  At the moment, I think that his arrhythmias are driven largely by his acute respiratory failure as well as his electrolyte derangements.  Once these improve, I would expect his supraventricular ectopy to abate.  In the meantime, I think it would be reasonable to initiate IV amiodarone as there is concerned that his frequent runs of tachycardia may impair hemodialysis.  We will continue with apixaban  5 mg twice daily based on his age and weight.  CHA2DS2-VASc score is at least 4.  Jesslynn Kruck-stage renal disease: Importance of maintaining volume status and trying to dialyze as regularly as possible were discussed at length with Gregory Crane.  He is adamant that he would not tolerate hemodialysis 3 days a week.  I encouraged him to explore alternative dialysis options with his nephrology team.  Elevated troponin: High-sensitivity troponin elevation is negligible and unlikely to reflect ACS in the setting of the patient's acute on chronic HFpEF, tachycardia, and acute respiratory failure.  No plans for ischemia evaluation at this time.  Risk Assessment/Risk Scores:        [place]  Heart Association (NYHA) Functional Class NYHA Class IV  CHA2DS2-VASc Score = 4   This indicates a 4.8% annual risk of stroke. The patient's score is based upon: CHF History: 0 HTN History: 1 Diabetes History: 1 Stroke History: 2 Vascular Disease History: 0 Age Score: 0 Gender Score: 0     For questions or updates, please  contact [facility] Please consult [email or web address] for contact info under [facility] Cardiology.  Signed, Sammy Crisp, MD  12/23/2023 4:40 PM

## 2023-12-23 NOTE — Assessment & Plan Note (Signed)
 Hyperglycemia on presentation, he was not on any antidiabetic medications recently.  A1c of 6.4 in February 2025 -Check A1c -Continue with SSI

## 2023-12-23 NOTE — Assessment & Plan Note (Addendum)
 Baseline ESRD with hemodialysis partial compliance Only going to dialysis on Tuesdays and Thursdays Current presentation with acute volume overload requiring BiPAP HD dependent volume status pending dialysis today Discussed importance of TTS hemodialysis Patient expressed understanding - Had 1 session of dialysis on admission, going for another 1 later today -Nephrology is on board and will monitor dialysis

## 2023-12-24 ENCOUNTER — Inpatient Hospital Stay

## 2023-12-24 ENCOUNTER — Ambulatory Visit: Payer: Medicare Other

## 2023-12-24 DIAGNOSIS — J9601 Acute respiratory failure with hypoxia: Secondary | ICD-10-CM

## 2023-12-24 DIAGNOSIS — E875 Hyperkalemia: Secondary | ICD-10-CM | POA: Diagnosis present

## 2023-12-24 DIAGNOSIS — I358 Other nonrheumatic aortic valve disorders: Secondary | ICD-10-CM | POA: Diagnosis present

## 2023-12-24 DIAGNOSIS — I4891 Unspecified atrial fibrillation: Secondary | ICD-10-CM | POA: Diagnosis present

## 2023-12-24 DIAGNOSIS — E1122 Type 2 diabetes mellitus with diabetic chronic kidney disease: Secondary | ICD-10-CM

## 2023-12-24 DIAGNOSIS — I10 Essential (primary) hypertension: Secondary | ICD-10-CM | POA: Diagnosis not present

## 2023-12-24 DIAGNOSIS — I69354 Hemiplegia and hemiparesis following cerebral infarction affecting left non-dominant side: Secondary | ICD-10-CM | POA: Diagnosis not present

## 2023-12-24 DIAGNOSIS — N186 End stage renal disease: Secondary | ICD-10-CM

## 2023-12-24 DIAGNOSIS — R7989 Other specified abnormal findings of blood chemistry: Secondary | ICD-10-CM | POA: Diagnosis not present

## 2023-12-24 DIAGNOSIS — I471 Supraventricular tachycardia, unspecified: Secondary | ICD-10-CM

## 2023-12-24 DIAGNOSIS — D631 Anemia in chronic kidney disease: Secondary | ICD-10-CM | POA: Diagnosis present

## 2023-12-24 DIAGNOSIS — J189 Pneumonia, unspecified organism: Principal | ICD-10-CM

## 2023-12-24 DIAGNOSIS — Z79899 Other long term (current) drug therapy: Secondary | ICD-10-CM | POA: Diagnosis not present

## 2023-12-24 DIAGNOSIS — Z7901 Long term (current) use of anticoagulants: Secondary | ICD-10-CM | POA: Diagnosis not present

## 2023-12-24 DIAGNOSIS — I132 Hypertensive heart and chronic kidney disease with heart failure and with stage 5 chronic kidney disease, or end stage renal disease: Secondary | ICD-10-CM | POA: Diagnosis present

## 2023-12-24 DIAGNOSIS — N2581 Secondary hyperparathyroidism of renal origin: Secondary | ICD-10-CM | POA: Diagnosis present

## 2023-12-24 DIAGNOSIS — I2489 Other forms of acute ischemic heart disease: Secondary | ICD-10-CM | POA: Diagnosis present

## 2023-12-24 DIAGNOSIS — I4719 Other supraventricular tachycardia: Secondary | ICD-10-CM | POA: Diagnosis not present

## 2023-12-24 DIAGNOSIS — E669 Obesity, unspecified: Secondary | ICD-10-CM | POA: Diagnosis present

## 2023-12-24 DIAGNOSIS — I4892 Unspecified atrial flutter: Secondary | ICD-10-CM | POA: Diagnosis present

## 2023-12-24 DIAGNOSIS — Z1152 Encounter for screening for COVID-19: Secondary | ICD-10-CM | POA: Diagnosis not present

## 2023-12-24 DIAGNOSIS — Z794 Long term (current) use of insulin: Secondary | ICD-10-CM

## 2023-12-24 DIAGNOSIS — K7460 Unspecified cirrhosis of liver: Secondary | ICD-10-CM | POA: Diagnosis present

## 2023-12-24 DIAGNOSIS — I5033 Acute on chronic diastolic (congestive) heart failure: Secondary | ICD-10-CM | POA: Diagnosis present

## 2023-12-24 DIAGNOSIS — Z992 Dependence on renal dialysis: Secondary | ICD-10-CM

## 2023-12-24 DIAGNOSIS — E039 Hypothyroidism, unspecified: Secondary | ICD-10-CM | POA: Diagnosis present

## 2023-12-24 DIAGNOSIS — Z79890 Hormone replacement therapy (postmenopausal): Secondary | ICD-10-CM | POA: Diagnosis not present

## 2023-12-24 DIAGNOSIS — R0603 Acute respiratory distress: Secondary | ICD-10-CM | POA: Diagnosis not present

## 2023-12-24 LAB — CBC
HCT: 29.9 % — ABNORMAL LOW (ref 39.0–52.0)
Hemoglobin: 10.3 g/dL — ABNORMAL LOW (ref 13.0–17.0)
MCH: 31.6 pg (ref 26.0–34.0)
MCHC: 34.4 g/dL (ref 30.0–36.0)
MCV: 91.7 fL (ref 80.0–100.0)
Platelets: 174 10*3/uL (ref 150–400)
RBC: 3.26 MIL/uL — ABNORMAL LOW (ref 4.22–5.81)
RDW: 14.1 % (ref 11.5–15.5)
WBC: 10.5 10*3/uL (ref 4.0–10.5)
nRBC: 0 % (ref 0.0–0.2)

## 2023-12-24 LAB — RESP PANEL BY RT-PCR (RSV, FLU A&B, COVID)  RVPGX2
Influenza A by PCR: NEGATIVE
Influenza B by PCR: NEGATIVE
Resp Syncytial Virus by PCR: NEGATIVE
SARS Coronavirus 2 by RT PCR: NEGATIVE

## 2023-12-24 LAB — HEMOGLOBIN A1C
Hgb A1c MFr Bld: 6.2 % — ABNORMAL HIGH (ref 4.8–5.6)
Mean Plasma Glucose: 131.24 mg/dL

## 2023-12-24 LAB — COMPREHENSIVE METABOLIC PANEL WITH GFR
ALT: 13 U/L (ref 0–44)
AST: 16 U/L (ref 15–41)
Albumin: 3.7 g/dL (ref 3.5–5.0)
Alkaline Phosphatase: 60 U/L (ref 38–126)
Anion gap: 15 (ref 5–15)
BUN: 56 mg/dL — ABNORMAL HIGH (ref 6–20)
CO2: 24 mmol/L (ref 22–32)
Calcium: 8.1 mg/dL — ABNORMAL LOW (ref 8.9–10.3)
Chloride: 99 mmol/L (ref 98–111)
Creatinine, Ser: 6.52 mg/dL — ABNORMAL HIGH (ref 0.61–1.24)
GFR, Estimated: 9 mL/min — ABNORMAL LOW (ref >60)
Glucose, Bld: 108 mg/dL — ABNORMAL HIGH (ref 70–99)
Potassium: 3.6 mmol/L (ref 3.5–5.1)
Sodium: 138 mmol/L (ref 135–145)
Total Bilirubin: 0.8 mg/dL (ref 0.0–1.2)
Total Protein: 7.3 g/dL (ref 6.5–8.1)

## 2023-12-24 LAB — GLUCOSE, CAPILLARY
Glucose-Capillary: 123 mg/dL — ABNORMAL HIGH (ref 70–99)
Glucose-Capillary: 121 mg/dL — ABNORMAL HIGH (ref 70–99)
Glucose-Capillary: 115 mg/dL — ABNORMAL HIGH (ref 70–99)
Glucose-Capillary: 142 mg/dL — ABNORMAL HIGH (ref 70–99)

## 2023-12-24 LAB — HEPATITIS PANEL, ACUTE
HCV Ab: NONREACTIVE
Hep A IgM: NONREACTIVE
Hep B C IgM: NONREACTIVE
Hepatitis B Surface Ag: NONREACTIVE

## 2023-12-24 MED ORDER — IOHEXOL 350 MG/ML SOLN
75.0000 mL | Freq: Once | INTRAVENOUS | Status: AC | PRN
  Administered 2023-12-24: 75 mL via INTRAVENOUS

## 2023-12-24 MED ORDER — HYDRALAZINE HCL 25 MG PO TABS
25.0000 mg | ORAL_TABLET | Freq: Two times a day (BID) | ORAL | Status: DC
  Administered 2023-12-24 – 2023-12-25 (×3): 25 mg via ORAL
  Filled 2023-12-24 (×4): qty 1

## 2023-12-24 NOTE — Care Management Obs Status (Signed)
 MEDICARE OBSERVATION STATUS NOTIFICATION   Patient Details  Name: Keaka Posadas MRN: [id number] Date of Birth: 30-Apr-1971   Medicare Observation Status Notification Given:  Yes    Anise Kerns 12/24/2023, 11:29 AM

## 2023-12-24 NOTE — Progress Notes (Signed)
   12/24/23 1925  Vitals  Temp 98.5 F (36.9 C)  Pulse Rate 78  Resp (!) 23  BP 121/73  SpO2 96 %  Post Treatment  Dialyzer Clearance Clear  Hemodialysis Intake (mL) 0 mL  Liters Processed 84  Fluid Removed (mL) 4000 mL  Tolerated HD Treatment Yes  AVG/AVF Arterial Site Held (minutes) 8 minutes  AVG/AVF Venous Site Held (minutes) 8 minutes   Received patient in bed to unit.  Alert and oriented.  Informed consent signed and in chart.   TX duration:3.5hrs UF  Patient tolerated well.  Transported back to the room  Alert, without acute distress.  Hand-off given to patient's nurse.   Access used: LAVF Access issues: NONE  Total UF removed: 4L Medication(s) given: NONE    [facility]

## 2023-12-24 NOTE — Progress Notes (Signed)
   12/24/23 0530  Vitals  Temp 98.6 F (37 C)  Temp Source Axillary  BP (!) 140/87  MAP (mmHg) 102  BP Location Right Arm  BP Method Automatic  Patient Position (if appropriate) Lying  Pulse Rate 72  Pulse Rate Source Monitor  ECG Heart Rate 74  Resp (!) 23  Oxygen Therapy  SpO2 98 %  O2 Device Bi-PAP  During Treatment Monitoring  Blood Flow Rate (mL/min) 350 mL/min  Arterial Pressure (mmHg) -174.94 mmHg  Venous Pressure (mmHg) 175.75 mmHg  TMP (mmHg) 36.16 mmHg  Ultrafiltration Rate (mL/min) 1413 mL/min  Dialysate Flow Rate (mL/min) 299 ml/min  Dialysate Potassium Concentration 2  Dialysate Calcium  Concentration 2.5  Duration of HD Treatment -hour(s) 3.46 hour(s)  Cumulative Fluid Removed (mL) per Treatment  3750  HD Safety Checks Performed Yes  Intra-Hemodialysis Comments Tx completed  Post Treatment  Dialyzer Clearance Lightly streaked  Liters Processed 73.5  Fluid Removed (mL) 3750 mL  Tolerated HD Treatment Yes  Post-Hemodialysis Comments  (Pt off Bipap)  AVG/AVF Arterial Site Held (minutes) 8 minutes  AVG/AVF Venous Site Held (minutes) 8 minutes  Fistula / Graft Left Upper arm Arteriovenous fistula  No placement date or time found.   Orientation: Left  Access Location: Upper arm  Access Type: Arteriovenous fistula  Site Condition No complications  Fistula / Graft Assessment Thrill;Bruit;Present  Status Deaccessed  Needle Size 15  Drainage Description None   Dialysis treatment uneventful.

## 2023-12-24 NOTE — Progress Notes (Signed)
 Rounding Note    Patient Name: Gregory Crane Date of Encounter: 12/24/2023  [facility] Cardiologist: Wendie Hamburg, MD   Subjective   Patient remains on BiPAP. Did undergo dialysis early this morning. Telemetry shows several runs of sinus tachycardia vs SVT with rates up to 180 bpm late yesterday evening. Overnight and this morning he remains in sinus rhythm with rates 70-80s bpm.   Inpatient Medications    Scheduled Meds:  apixaban   5 mg Oral BID   Chlorhexidine  Gluconate Cloth  6 each Topical Q0600   insulin  aspart  0-9 Units Subcutaneous TID WC   levothyroxine   175 mcg Oral Q0600   pregabalin   100 mg Oral Daily   rosuvastatin   5 mg Oral Daily   sevelamer  carbonate  800 mg Oral TID WC   sodium chloride  flush  3 mL Intravenous Q12H   Continuous Infusions:  sodium chloride      amiodarone 30 mg/hr (12/24/23 0529)   azithromycin      cefTRIAXone  (ROCEPHIN )  IV     PRN Meds: sodium chloride , acetaminophen , metoprolol  tartrate, ondansetron  **OR** ondansetron  (ZOFRAN ) IV, sodium chloride  flush   Vital Signs    Vitals:   12/24/23 0530 12/24/23 0545 12/24/23 0623 12/24/23 0700  BP: (!) 140/87 (!) 137/105  (!) 149/68  Pulse: 72 77  78  Resp: (!) 23 19  (!) 27  Temp: 98.6 F (37 C)     TempSrc: Axillary     SpO2: 98% 97%  (!) 89%  Weight:   119.3 kg   Height:        Intake/Output Summary (Last 24 hours) at 12/24/2023 0825 Last data filed at 12/24/2023 0530 Gross per 24 hour  Intake 260.85 ml  Output 4000 ml  Net -3739.15 ml      12/24/2023    6:23 AM 12/23/2023    3:36 PM 12/23/2023    8:07 AM  Last 3 Weights  Weight (lbs) 263 lb 0.1 oz 269 lb 2.9 oz 250 lb  Weight (kg) 119.3 kg 122.1 kg 113.399 kg      Telemetry    Yesterday evening several runs of sinus tachycardia vs SVT, this morning sinus rhythm with rates 70-80s bpm - Personally Reviewed  Physical Exam   GEN: No acute distress.   Neck: No JVD Cardiac: RRR, no murmurs, rubs, or  gallops.  Respiratory: Coarse breath sounds, on BiPAP GI: Soft, nontender, non-distended  MS: Trace LE edema; No deformity. Neuro:  Nonfocal  Psych: Normal affect   Labs    High Sensitivity Troponin:   Recent Labs  Lab 12/23/23 0814 12/23/23 1025  TROPONINIHS 21* 25*     Chemistry Recent Labs  Lab 12/23/23 0813 12/23/23 0814 12/24/23 0630  NA  --  136 138  K  --  6.2* 3.6  CL  --  107 99  CO2  --  19* 24  GLUCOSE  --  226* 108*  BUN  --  88* 56*  CREATININE  --  10.42* 6.52*  CALCIUM   --  7.6* 8.1*  PROT 8.4*  --  7.3  ALBUMIN 4.4  --  3.7  AST 17  --  16  ALT 15  --  13  ALKPHOS 79  --  60  BILITOT 0.8  --  0.8  GFRNONAA  --  5* 9*  ANIONGAP  --  10 15    Lipids No results for input(s): "CHOL", "TRIG", "HDL", "LABVLDL", "LDLCALC", "CHOLHDL" in the last 168 hours.  Hematology Recent Labs  Lab 12/23/23 0814 12/24/23 0630  WBC 11.2* 10.5  RBC 4.01* 3.26*  HGB 12.4* 10.3*  HCT 38.0* 29.9*  MCV 94.8 91.7  MCH 30.9 31.6  MCHC 32.6 34.4  RDW 14.0 14.1  PLT 194 174   Thyroid  No results for input(s): "TSH", "FREET4" in the last 168 hours.  BNP Recent Labs  Lab 12/23/23 0814  BNP 1,683.5*    DDimer  Recent Labs  Lab 12/23/23 0813  DDIMER 0.87*     Radiology    CT CHEST WO CONTRAST Result Date: 12/23/2023 IMPRESSION: 1. Multifocal pneumonia. Follow-up to resolution recommended. 2. Small bilateral pleural effusions. 3.  Aortic Atherosclerosis (ICD10-I70.0). Electronically Signed   By: Angus Bark M.D.   On: 12/23/2023 13:49   DG Chest Portable 1 View Result Date: 12/23/2023 IMPRESSION: Bilateral airspace opacities suspicious for multifocal pneumonia. Electronically Signed   By: Elester Grim M.D.   On: 12/23/2023 08:32   Cardiac Studies   07/02/2023 Echo complete 1. Left ventricular ejection fraction, by estimation, is 50 to 55%. The  left ventricle has low normal function. The left ventricle has no regional  wall motion abnormalities. Left  ventricular diastolic parameters are  indeterminate.   2. Right ventricular systolic function is normal. The right ventricular  size is normal. There is normal pulmonary artery systolic pressure. The  estimated right ventricular systolic pressure is 19.0 mmHg.   3. The mitral valve is normal in structure. Mild mitral valve  regurgitation. No evidence of mitral stenosis.   4. The aortic valve is tricuspid. There is mild calcification of the  aortic valve. Aortic valve regurgitation is not visualized. Aortic valve  sclerosis/calcification is present, without any evidence of aortic  stenosis.   5. The inferior vena cava is normal in size with greater than 50%  respiratory variability, suggesting right atrial pressure of 3 mmHg.   Patient Profile     Gregory Crane is a 53 y.o. male with a hx of T2DM, cirrhosis, pretension, history of stroke, ESRD, HFpEF, and atrial fibrillation/flutter, admitted 4/29 with volume overload. Cardiology was asked to consult for tachycardia and acute on chronic HFpEF.   Assessment & Plan    Acute on chronic HFpEF Acute respiratory failure with hypoxia - Presented 4/29 with respiratory decompensation in the setting of acute heart failure and volume overload from ESRD/inadequate hemodialysis - BNP 1683 - CXR and CT showing multifocal pneumonia - S/p IV Lasix  120 mg on 4/29 - Underwent hemodialysis this morning with some improvement in volume status - Repeat echo pending - BP labile with some low pressures, consider reintroduction of PTA antihypertensives once BP stabilizes - Recommend continued hemodialysis per nephrology - Wean BiPAP as able  SVT  Atrial fibrillation/flutter - History of afib/flutter, although EKG and telemetry here show atrial tachycardia vs SVT - CHA2DS2VASc at least 4 - Likely driven by acute respiratory failure and electrolyte derangements - Some runs of AT vs SVT late yesterday evening, overnight and this morning in sinus rhythm with  rates stable - Continue IV amiodarone for now with plan to transition to oral tomorrow if rate remains stable - Continue Eliquis  5 mg twice daily  ESRD - Ongoing management per nephrology  Elevated troponin - Troponin mildly elevated - Suspect demand ischemia in the setting of acute on chronic HFpEF, tachycardia, and acute respiratory failure - No plans for further ischemic evaluation at this time  For questions or updates, please contact [facility] Please consult [email or web address] for  contact info under        Signed, Brodie Cannon, PA-C  12/24/2023, 8:25 AM

## 2023-12-24 NOTE — TOC Progression Note (Signed)
 Transition of Care [facility]) - Progression Note    Patient Details  Name: Gregory Crane MRN: [id number] Date of Birth: 1970-09-17  Transition of Care [facility]) CM/SW Contact  Zoe Hinds, RN Phone Number: 12/24/2023, 4:48 PM  Clinical Narrative:    Pt 53 y.o. male w HOD# 1 pt not medically ready to dc today recieved  dialysis today, per chart review & medical rounds remains having difficulty weaning off from [facility] CTA chest to rule out PE as patient was not taking his Eliquis  consistently, Echocardiogram pending. TOC will cont to follow pt while hospitalized and update as applicable.             Expected Discharge Plan and Services                                               Social Determinants of Health (SDOH) Interventions SDOH Screenings   Food Insecurity: No Food Insecurity (09/12/2023)   Received from [facility]  Housing: Low Risk  (12/23/2023)  Transportation Needs: No Transportation Needs (09/12/2023)   Received from [facility]  Utilities: Not At Risk (09/12/2023)   Received from [facility]  Alcohol Screen: Low Risk  (06/25/2023)  Depression (PHQ2-9): Low Risk  (10/09/2023)  Financial Resource Strain: Medium Risk (09/12/2023)   Received from [facility]  Physical Activity: Insufficiently Active (06/25/2023)  Social Connections: Moderately Isolated (06/25/2023)  Stress: No Stress Concern Present (07/09/2023)   Received from [facility]  Tobacco Use: Low Risk  (12/23/2023)  Health Literacy: Adequate Health Literacy (06/25/2023)    Readmission Risk Interventions     No data to display

## 2023-12-24 NOTE — Hospital Course (Addendum)
 Taken from H&P.  Gregory Crane is a 53 y.o. male with medical history significant of obesity, type 2 Diabetes, ESRD on HD MWF, HTN, hypothyroidism, CVA, bells palsy presenting w/ acute resp failure with hypoxia, volume overload, PNA, afib w/ RVR. Pt reports increased WOB over the past 1 to 2 days.  Patient reports only partial compliance with hemodialysis regimen.  Last dialysis was 5 days ago.  Patient also missed doses of Eliquis .  Does not follow fluid restriction  On presentation tachycardic, was placed on BiPAP, and labs with leukocytosis at 11.2, creatinine 10.4, glucose 226, potassium 6.2, troponin 20 Chest x-ray with bilateral opacities suspicious for multifocal pneumonia. EKG with concern of SVT/atrial tachycardia. Cardiology and nephrology was consulted-patient was taken for emergent dialysis.  Patient was started on amiodarone  by cardiology with resultant improvement in heart rate.  4/30: Still tachypneic and borderline hypoxic on BiPAP.  CT chest yesterday with concern of multifocal pneumonia and small bilateral pleural effusions.  Procalcitonin at 0.23, troponin with a flat curve and barely positive, leukocytosis resolved.  BNP significantly elevated at 1683, more than his baseline.  Nephrology to do another session of dialysis today. Having difficulty weaning him off from [facility] CTA chest to rule out PE as patient was not taking his Eliquis  consistently.  Echocardiogram pending.  5/1: Hemodynamically stable, had another dialysis yesterday evening with UF of 3.5 L.  Respiratory panels negative.  A1c of 6.2.  Admitted on infusion is being switched with p.o.  Going for another session of dialysis today.    Improved oxygen requirement.  5/2: Hemodynamically stable, able to wean back to room air.  Patient had 3 sessions of dialysis during current hospitalization with resultant improvement in volume status.  He was instructed to stay compliance with 3 times a week dialysis to prevent  further hospitalization due to volume overload. Patient was also given 2 more days of Augmentin  to complete a 5-day course for concern of pneumonia. Patient was also started on amiodarone , Cardizem  and metoprolol  for concern of SVT and he was instructed to be compliant with Eliquis .  Home amlodipine  was discontinued.  Patient will continue on current medications and need to have a close follow-up with his providers for further assistance.

## 2023-12-24 NOTE — Progress Notes (Signed)
 Progress Note   Patient: Gregory Crane BJY:[id number] DOB: 1970/11/09 DOA: 12/23/2023     0 DOS: the patient was seen and examined on 12/24/2023   Brief hospital course: Taken from H&P.  Zayvon Yelle is a 53 y.o. male with medical history significant of obesity, type 2 Diabetes, ESRD on HD MWF, HTN, hypothyroidism, CVA, bells palsy presenting w/ acute resp failure with hypoxia, volume overload, PNA, afib w/ RVR. Pt reports increased WOB over the past 1 to 2 days.  Patient reports only partial compliance with hemodialysis regimen.  Last dialysis was 5 days ago.  Patient also missed doses of Eliquis .  Does not follow fluid restriction  On presentation tachycardic, was placed on BiPAP, and labs with leukocytosis at 11.2, creatinine 10.4, glucose 226, potassium 6.2, troponin 20 Chest x-ray with bilateral opacities suspicious for multifocal pneumonia. EKG with concern of SVT/atrial tachycardia. Cardiology and nephrology was consulted-patient was taken for emergent dialysis.  Patient was started on amiodarone by cardiology with resultant improvement in heart rate.  4/30: Still tachypneic and borderline hypoxic on BiPAP.  CT chest yesterday with concern of multifocal pneumonia and small bilateral pleural effusions.  Procalcitonin at 0.23, troponin with a flat curve and barely positive, leukocytosis resolved.  BNP significantly elevated at 1683, more than his baseline.  Nephrology to do another session of dialysis today. Having difficulty weaning him off from [facility] CTA chest to rule out PE as patient was not taking his Eliquis  consistently.  Echocardiogram pending.      Assessment and Plan: * Acute respiratory failure with hypoxia (HCC) Pneumonia  Volume Overload  Decompensated respiratory status requiring BiPAP in the setting of volume overload associated with ESRD and partial dialysis regimen as well as concern for possible multifocal pneumonia.  Procalcitonin at 0.23 Predominantly  HD dependent volume status  Had 1 session of dialysis-planning for another session later today. Also ordered CTA due to difficulty weaning him from BiPAP-no baseline oxygen use.  Preliminary blood cultures negative. IV Rocephin  azithromycin  for infectious coverage Continue NIPPV Pending urine studies for strep pneumo and Legionella  Atrial fibrillation with rapid ventricular response [facility]) Patient initially presented with concern of A-fib with RVR, cardiology was consulted and it looks like he was having SVT/AT Started on amiodarone infusion-currently in sinus rhythm -Continue with Eliquis  -Continue with amiodarone-give is planning to resume metoprolol  once respiratory status improved  ESRD on hemodialysis (HCC) Baseline ESRD with hemodialysis partial compliance Only going to dialysis on Tuesdays and Thursdays Current presentation with acute volume overload requiring BiPAP HD dependent volume status pending dialysis today Discussed importance of TTS hemodialysis Patient expressed understanding - Had 1 session of dialysis on admission, going for another 1 later today -Nephrology is on board and will monitor dialysis  History of CVA (cerebrovascular accident) Remote history of CVA w/ chronic L sided weakness.  At baseline. Cont home statin Patient is on Eliquis  instead of aspirin   Hypertension Blood pressure mildly elevated -Resuming home hydralazine  -Keep holding amlodipine  for concern of acute CHF-we will resume after echocardiogram  Type 2 diabetes mellitus, with long-term current use of insulin  (HCC) Hyperglycemia on presentation, he was not on any antidiabetic medications recently.  A1c of 6.4 in February 2025 -Check A1c -Continue with SSI   Subjective: Patient was seen and examined today.  Sister at bedside.  He was missing his dialysis and medication most of the time.  Counseling was provided.  Patient remain on BiPAP.  At baseline he is quite sedentary.  Physical  Exam:  Vitals:   12/24/23 0530 12/24/23 0545 12/24/23 0623 12/24/23 0700  BP: (!) 140/87 (!) 137/105  (!) 149/68  Pulse: 72 77  78  Resp: (!) 23 19  (!) 27  Temp: 98.6 F (37 C)     TempSrc: Axillary     SpO2: 98% 97%  (!) 89%  Weight:   119.3 kg   Height:       General.  Obese gentleman, in no acute distress. Pulmonary.  Few scattered crackles bilaterally, normal respiratory effort. CV.  Regular rate and rhythm, no JVD, rub or murmur. Abdomen.  Soft, nontender, nondistended, BS positive. CNS.  Alert and oriented .  No focal neurologic deficit. Extremities.  Trace LE edema, no cyanosis, pulses intact and symmetrical.  Mild erythema of left lower extremity Psychiatry.  Judgment and insight appears normal.   Data Reviewed: Prior data reviewed  Family Communication: Discussed with sister at bedside  Disposition: Status is: Inpatient Remains inpatient appropriate because: Severity of illness  Planned Discharge Destination: Home  ED prophylaxis.  Eliquis  Time spent: 50 minutes  This record has been created using Conservation officer, historic buildings. Errors have been sought and corrected,but may not always be located. Such creation errors do not reflect on the standard of care.   Author: Luna Salinas, MD 12/24/2023 2:14 PM  For on call review [email or web address].

## 2023-12-24 NOTE — Plan of Care (Signed)
   Problem: Education: Goal: Ability to describe self-care measures that may prevent or decrease complications (Diabetes Survival Skills Education) will improve Outcome: Progressing Goal: Individualized Educational Video(s) Outcome: Progressing   Problem: Coping: Goal: Ability to adjust to condition or change in health will improve Outcome: Progressing   Problem: Fluid Volume: Goal: Ability to maintain a balanced intake and output will improve Outcome: Progressing   Problem: Health Behavior/Discharge Planning: Goal: Ability to identify and utilize available resources and services will improve Outcome: Progressing Goal: Ability to manage health-related needs will improve Outcome: Progressing   Problem: Metabolic: Goal: Ability to maintain appropriate glucose levels will improve Outcome: Progressing   Problem: Nutritional: Goal: Maintenance of adequate nutrition will improve Outcome: Progressing Goal: Progress toward achieving an optimal weight will improve Outcome: Progressing   Problem: Skin Integrity: Goal: Risk for impaired skin integrity will decrease Outcome: Progressing   Problem: Tissue Perfusion: Goal: Adequacy of tissue perfusion will improve Outcome: Progressing   Problem: Activity: Goal: Ability to tolerate increased activity will improve Outcome: Progressing   Problem: Clinical Measurements: Goal: Ability to maintain a body temperature in the normal range will improve Outcome: Progressing   Problem: Respiratory: Goal: Ability to maintain adequate ventilation will improve Outcome: Progressing Goal: Ability to maintain a clear airway will improve Outcome: Progressing   Problem: Education: Goal: Knowledge of General Education information will improve Description: Including pain rating scale, medication(s)/side effects and non-pharmacologic comfort measures Outcome: Progressing   Problem: Health Behavior/Discharge Planning: Goal: Ability to manage  health-related needs will improve Outcome: Progressing   Problem: Clinical Measurements: Goal: Ability to maintain clinical measurements within normal limits will improve Outcome: Progressing Goal: Will remain free from infection Outcome: Progressing Goal: Diagnostic test results will improve Outcome: Progressing Goal: Respiratory complications will improve Outcome: Progressing Goal: Cardiovascular complication will be avoided Outcome: Progressing   Problem: Activity: Goal: Risk for activity intolerance will decrease Outcome: Progressing   Problem: Nutrition: Goal: Adequate nutrition will be maintained Outcome: Progressing   Problem: Coping: Goal: Level of anxiety will decrease Outcome: Progressing   Problem: Elimination: Goal: Will not experience complications related to bowel motility Outcome: Progressing Goal: Will not experience complications related to urinary retention Outcome: Progressing   Problem: Pain Managment: Goal: General experience of comfort will improve and/or be controlled Outcome: Progressing   Problem: Safety: Goal: Ability to remain free from injury will improve Outcome: Progressing   Problem: Skin Integrity: Goal: Risk for impaired skin integrity will decrease Outcome: Progressing

## 2023-12-24 NOTE — Progress Notes (Signed)
 [facility]  ROUNDING NOTE   Subjective:   Gregory Crane is a 53 y.o. with past medical conditions including diabetes, hypertension, CVA, and end stage renal disease. Patient presents to the emergency department with increased shortness of breath. He has been admitted for Acute respiratory failure with hypoxia (HCC) [J96.01]  Patient is known to our practice and currently receives outpatient dialysis at [facility] supervised by Dr. Rhesa Celeste.    Update: Patient seen laying in bed Bipap in place, no family present during initial visit Edema remains.    Objective:  Vital signs in last 24 hours:  Temp:  [97.2 F (36.2 C)-99 F (37.2 C)] 98.6 F (37 C) (04/30 0530) Pulse Rate:  [25-157] 78 (04/30 0700) Resp:  [16-46] 27 (04/30 0700) BP: (91-163)/(65-137) 149/68 (04/30 0700) SpO2:  [89 %-100 %] 89 % (04/30 0700) FiO2 (%):  [35 %-70 %] 35 % (04/30 0800) Weight:  [119.3 kg-122.1 kg] 119.3 kg (04/30 0623)  Weight change:  Filed Weights   12/23/23 0807 12/23/23 1536 12/24/23 0623  Weight: 113.4 kg 122.1 kg 119.3 kg    Intake/Output: I/O last 3 completed shifts: In: 260.9 [I.V.:260.9] Out: 4000 [Urine:250; Other:3750]   Intake/Output this shift:  No intake/output data recorded.  Physical Exam: General: Ill-appearing, restless  Head: Normocephalic, atraumatic.   Eyes: Anicteric  Lungs:  Wheezing, Rales, BiPAP in place  Heart: Irregular rate and rhythm  Abdomen:  Soft, nontender, obese  Extremities: 3+ peripheral edema.  Neurologic: Alert and oriented, moving all four extremities  Skin: No lesions  Access: aVF    Basic Metabolic Panel: Recent Labs  Lab 12/23/23 0814 12/24/23 0630  NA 136 138  K 6.2* 3.6  CL 107 99  CO2 19* 24  GLUCOSE 226* 108*  BUN 88* 56*  CREATININE 10.42* 6.52*  CALCIUM  7.6* 8.1*    Liver Function Tests: Recent Labs  Lab 12/23/23 0813 12/24/23 0630  AST 17 16  ALT 15 13  ALKPHOS 79 60  BILITOT 0.8 0.8  PROT 8.4* 7.3   ALBUMIN 4.4 3.7   No results for input(s): "LIPASE", "AMYLASE" in the last 168 hours. No results for input(s): "AMMONIA" in the last 168 hours.  CBC: Recent Labs  Lab 12/23/23 0814 12/24/23 0630  WBC 11.2* 10.5  NEUTROABS 10.5*  --   HGB 12.4* 10.3*  HCT 38.0* 29.9*  MCV 94.8 91.7  PLT 194 174    Cardiac Enzymes: No results for input(s): "CKTOTAL", "CKMB", "CKMBINDEX", "TROPONINI" in the last 168 hours.  BNP: Invalid input(s): "POCBNP"  CBG: Recent Labs  Lab 12/23/23 1541 12/23/23 2335 12/24/23 0334 12/24/23 0750 12/24/23 1117  GLUCAP 193* 163* 123* 121* 115*    Microbiology: Results for orders placed or performed during the hospital encounter of 12/23/23  Blood Culture (routine x 2)     Status: None (Preliminary result)   Collection Time: 12/23/23  8:52 AM   Specimen: BLOOD  Result Value Ref Range Status   Specimen Description BLOOD BLOOD RIGHT ARM  Final   Special Requests   Final    BOTTLES DRAWN AEROBIC AND ANAEROBIC Blood Culture adequate volume   Culture   Final    NO GROWTH < 24 HOURS Performed at [facility], [address] [place], [place] [address]    Report Status PENDING  Incomplete  Blood Culture (routine x 2)     Status: None (Preliminary result)   Collection Time: 12/23/23 10:25 AM   Specimen: BLOOD  Result Value Ref Range  Status   Specimen Description BLOOD RIGHT AC  Final   Special Requests   Final    BOTTLES DRAWN AEROBIC AND ANAEROBIC Blood Culture adequate volume   Culture   Final    NO GROWTH < 24 HOURS Performed at [facility], [address] [place], [place] [address]    Report Status PENDING  Incomplete  MRSA Next Gen by PCR, Nasal     Status: None   Collection Time: 12/23/23  5:00 PM   Specimen: Nasal Mucosa; Nasal Swab  Result Value Ref Range Status   MRSA by PCR Next Gen NOT DETECTED NOT DETECTED Final    Comment: (NOTE) The GeneXpert MRSA Assay (FDA approved for NASAL specimens only), is one  component of a comprehensive MRSA colonization surveillance program. It is not intended to diagnose MRSA infection nor to guide or monitor treatment for MRSA infections. Test performance is not FDA approved in patients less than 28 years old. Performed at [facility], [address] [place], [place] [address]    *Note: Due to a large number of results and/or encounters for the requested time period, some results have not been displayed. A complete set of results can be found in Results Review.    Coagulation Studies: No results for input(s): "LABPROT", "INR" in the last 72 hours.  Urinalysis: No results for input(s): "COLORURINE", "LABSPEC", "PHURINE", "GLUCOSEU", "HGBUR", "BILIRUBINUR", "KETONESUR", "PROTEINUR", "UROBILINOGEN", "NITRITE", "LEUKOCYTESUR" in the last 72 hours.  Invalid input(s): "APPERANCEUR"    Imaging: CT CHEST WO CONTRAST Result Date: 12/23/2023 CLINICAL DATA:  Respiratory illness.  Pneumonia. EXAM: CT CHEST WITHOUT CONTRAST TECHNIQUE: Multidetector CT imaging of the chest was performed following the standard protocol without IV contrast. RADIATION DOSE REDUCTION: This exam was performed according to the departmental dose-optimization program which includes automated exposure control, adjustment of the mA and/or kV according to patient size and/or use of iterative reconstruction technique. COMPARISON:  Chest radiograph dated 12/23/2023. FINDINGS: Evaluation of this exam is limited in the absence of intravenous contrast. Cardiovascular: Top-normal cardiac size. No pericardial effusion. Three-vessel coronary vascular calcification. Mild atherosclerotic calcification of the thoracic aorta. No aneurysmal dilatation. The central pulmonary arteries are grossly unremarkable. Mediastinum/Nodes: No obvious hilar or mediastinal adenopathy. Evaluation however is limited in the absence of intravenous contrast and due to consolidative changes of the lungs. The esophagus is  grossly unremarkable. No mediastinal fluid collection. Lungs/Pleura: Small bilateral pleural effusions. Large area of bilateral pulmonary consolidation consistent with multifocal pneumonia. Follow-up to resolution recommended. No pneumothorax. The central airways are patent. Upper Abdomen: No acute abnormality. Musculoskeletal: Degenerative changes.  No acute osseous pathology. IMPRESSION: 1. Multifocal pneumonia. Follow-up to resolution recommended. 2. Small bilateral pleural effusions. 3.  Aortic Atherosclerosis (ICD10-I70.0). Electronically Signed   By: Angus Bark M.D.   On: 12/23/2023 13:49   DG Chest Portable 1 View Result Date: 12/23/2023 CLINICAL DATA:  Shortness of breath. EXAM: PORTABLE CHEST - 1 VIEW COMPARISON:  07/02/2023 FINDINGS: Heart size within normal limits. Mild pulmonary vascular congestion. Bilateral airspace opacities with greatest concentration in the right upper lung and left lung base. IMPRESSION: Bilateral airspace opacities suspicious for multifocal pneumonia. Electronically Signed   By: Elester Grim M.D.   On: 12/23/2023 08:32     Medications:    sodium chloride      amiodarone 30 mg/hr (12/24/23 0841)   azithromycin  500 mg (12/24/23 1143)   cefTRIAXone  (ROCEPHIN )  IV 2 g (12/24/23 1050)    apixaban   5 mg Oral BID   Chlorhexidine   Gluconate Cloth  6 each Topical Q0600   insulin  aspart  0-9 Units Subcutaneous TID WC   levothyroxine   175 mcg Oral Q0600   pregabalin   100 mg Oral Daily   rosuvastatin   5 mg Oral Daily   sevelamer  carbonate  800 mg Oral TID WC   sodium chloride  flush  3 mL Intravenous Q12H   sodium chloride , acetaminophen , metoprolol  tartrate, ondansetron  **OR** ondansetron  (ZOFRAN ) IV, sodium chloride  flush  Assessment/ Plan:  Mr. Davied Haislip is a 53 y.o.  male past medical conditions including diabetes, hypertension, CVA, and end stage renal disease. Patient presents to the emergency department with increased shortness of breath. He has been  admitted for Acute respiratory failure with hypoxia (HCC) [J96.01]  [facility]/TTS/aVF  End-stage renal disease with hyperkalemia on hemodialysis.  Last treatment received on Saturday.  Noncompliance with scheduled dialysis for the past month.  Severely volume overloaded.  Potassium 6.2, patient received Lokelma and shifting measures in emergency department.    Patient received dialysis early this morning, UF . Failed bipap wean. Will perform additional UF only treatment today, UF goal 3.5-4L. Potassium corrected with dialysis.   2.  Acute respiratory failure due to fluid overload and pneumonia.  Chest x-ray suspicious for bilateral multifocal pneumonia.  Also self prescribed twice weekly dialysis treatments for the past month.    Received dialysis early this morning, Bipap weaned for short period. Replaced after desaturations. Will remove additional fluid today.   3.  Atrial fibrillation with RVR, heart rate ranges from 90s to 180s. Patient states he inconsistently follows with outpatient cardiology.    Remains on amiodarone drip.   4. Anemia of chronic kidney disease Lab Results  Component Value Date   HGB 10.3 (L) 12/24/2023    Hemoglobin at goal.  No need for ESA's at this time.  5. Secondary Hyperparathyroidism: with outpatient labs: Unavailable  Lab Results  Component Value Date   CALCIUM  8.1 (L) 12/24/2023   PHOS 6.8 (H) 05/01/2023    Calcium  and phosphorus levels improving.    LOS: 0 Elin Fenley 4/30/202511:43 AM

## 2023-12-25 ENCOUNTER — Inpatient Hospital Stay (HOSPITAL_COMMUNITY)
Admit: 2023-12-25 | Discharge: 2023-12-25 | Disposition: A | Discharge status: Home / Self Care | Attending: Internal Medicine | Admitting: Internal Medicine

## 2023-12-25 DIAGNOSIS — R0603 Acute respiratory distress: Secondary | ICD-10-CM

## 2023-12-25 DIAGNOSIS — J9601 Acute respiratory failure with hypoxia: Secondary | ICD-10-CM | POA: Diagnosis not present

## 2023-12-25 DIAGNOSIS — I471 Supraventricular tachycardia, unspecified: Secondary | ICD-10-CM | POA: Diagnosis not present

## 2023-12-25 DIAGNOSIS — I4891 Unspecified atrial fibrillation: Secondary | ICD-10-CM | POA: Diagnosis not present

## 2023-12-25 DIAGNOSIS — J189 Pneumonia, unspecified organism: Secondary | ICD-10-CM | POA: Diagnosis not present

## 2023-12-25 LAB — CBC
HCT: 31.2 % — ABNORMAL LOW (ref 39.0–52.0)
Hemoglobin: 10.3 g/dL — ABNORMAL LOW (ref 13.0–17.0)
MCH: 31 pg (ref 26.0–34.0)
MCHC: 33 g/dL (ref 30.0–36.0)
MCV: 94 fL (ref 80.0–100.0)
Platelets: 157 10*3/uL (ref 150–400)
RBC: 3.32 MIL/uL — ABNORMAL LOW (ref 4.22–5.81)
RDW: 14.1 % (ref 11.5–15.5)
WBC: 6.3 10*3/uL (ref 4.0–10.5)
nRBC: 0 % (ref 0.0–0.2)

## 2023-12-25 LAB — ECHOCARDIOGRAM COMPLETE
AR max vel: 2.59 cm2
AV Area VTI: 2.51 cm2
AV Area mean vel: 2.3 cm2
AV Mean grad: 4 mmHg
AV Peak grad: 6.9 mmHg
Ao pk vel: 1.31 m/s
Area-P 1/2: 4.08 cm2
Height: 72 in
MV VTI: 2.65 cm2
S' Lateral: 2.7 cm
Weight: 4045.88 [oz_av]

## 2023-12-25 LAB — RESPIRATORY PANEL BY PCR

## 2023-12-25 LAB — RENAL FUNCTION PANEL
Albumin: 3.3 g/dL — ABNORMAL LOW (ref 3.5–5.0)
Anion gap: 16 — ABNORMAL HIGH (ref 5–15)
BUN: 80 mg/dL — ABNORMAL HIGH (ref 6–20)
CO2: 21 mmol/L — ABNORMAL LOW (ref 22–32)
Calcium: 7.3 mg/dL — ABNORMAL LOW (ref 8.9–10.3)
Chloride: 98 mmol/L (ref 98–111)
Creatinine, Ser: 8.98 mg/dL — ABNORMAL HIGH (ref 0.61–1.24)
GFR, Estimated: 6 mL/min — ABNORMAL LOW (ref >60)
Glucose, Bld: 97 mg/dL (ref 70–99)
Phosphorus: 8.4 mg/dL — ABNORMAL HIGH (ref 2.5–4.6)
Potassium: 4 mmol/L (ref 3.5–5.1)
Sodium: 135 mmol/L (ref 135–145)

## 2023-12-25 LAB — GLUCOSE, CAPILLARY
Glucose-Capillary: 97 mg/dL (ref 70–99)
Glucose-Capillary: 172 mg/dL — ABNORMAL HIGH (ref 70–99)
Glucose-Capillary: 82 mg/dL (ref 70–99)
Glucose-Capillary: 113 mg/dL — ABNORMAL HIGH (ref 70–99)

## 2023-12-25 MED ORDER — PENTAFLUOROPROP-TETRAFLUOROETH EX AERO
INHALATION_SPRAY | CUTANEOUS | Status: AC
  Filled 2023-12-25: qty 30

## 2023-12-25 MED ORDER — DILTIAZEM HCL ER COATED BEADS 120 MG PO CP24
120.0000 mg | ORAL_CAPSULE | Freq: Every day | ORAL | Status: DC
  Administered 2023-12-25 – 2023-12-26 (×2): 120 mg via ORAL
  Filled 2023-12-25 (×3): qty 1

## 2023-12-25 MED ORDER — AMIODARONE HCL 200 MG PO TABS
200.0000 mg | ORAL_TABLET | Freq: Two times a day (BID) | ORAL | Status: DC
  Administered 2023-12-25 – 2023-12-26 (×3): 200 mg via ORAL
  Filled 2023-12-25 (×3): qty 1

## 2023-12-25 MED ORDER — AMLODIPINE BESYLATE 10 MG PO TABS
10.0000 mg | ORAL_TABLET | Freq: Every day | ORAL | Status: DC
  Filled 2023-12-25: qty 1

## 2023-12-25 MED ORDER — PENTAFLUOROPROP-TETRAFLUOROETH EX AERO: 1.0000 | INHALATION_SPRAY | CUTANEOUS | Status: DC | PRN

## 2023-12-25 MED ORDER — METOPROLOL TARTRATE 25 MG PO TABS
25.0000 mg | ORAL_TABLET | Freq: Two times a day (BID) | ORAL | Status: DC
  Administered 2023-12-25: 25 mg via ORAL
  Filled 2023-12-25 (×2): qty 1

## 2023-12-25 NOTE — Plan of Care (Signed)
   Problem: Education: Goal: Ability to describe self-care measures that may prevent or decrease complications (Diabetes Survival Skills Education) will improve Outcome: Progressing Goal: Individualized Educational Video(s) Outcome: Progressing   Problem: Coping: Goal: Ability to adjust to condition or change in health will improve Outcome: Progressing   Problem: Fluid Volume: Goal: Ability to maintain a balanced intake and output will improve Outcome: Progressing   Problem: Health Behavior/Discharge Planning: Goal: Ability to identify and utilize available resources and services will improve Outcome: Progressing Goal: Ability to manage health-related needs will improve Outcome: Progressing   Problem: Metabolic: Goal: Ability to maintain appropriate glucose levels will improve Outcome: Progressing   Problem: Nutritional: Goal: Maintenance of adequate nutrition will improve Outcome: Progressing Goal: Progress toward achieving an optimal weight will improve Outcome: Progressing   Problem: Skin Integrity: Goal: Risk for impaired skin integrity will decrease Outcome: Progressing   Problem: Tissue Perfusion: Goal: Adequacy of tissue perfusion will improve Outcome: Progressing   Problem: Activity: Goal: Ability to tolerate increased activity will improve Outcome: Progressing   Problem: Clinical Measurements: Goal: Ability to maintain a body temperature in the normal range will improve Outcome: Progressing   Problem: Respiratory: Goal: Ability to maintain adequate ventilation will improve Outcome: Progressing Goal: Ability to maintain a clear airway will improve Outcome: Progressing   Problem: Education: Goal: Knowledge of General Education information will improve Description: Including pain rating scale, medication(s)/side effects and non-pharmacologic comfort measures Outcome: Progressing   Problem: Health Behavior/Discharge Planning: Goal: Ability to manage  health-related needs will improve Outcome: Progressing   Problem: Clinical Measurements: Goal: Ability to maintain clinical measurements within normal limits will improve Outcome: Progressing Goal: Will remain free from infection Outcome: Progressing Goal: Diagnostic test results will improve Outcome: Progressing Goal: Respiratory complications will improve Outcome: Progressing Goal: Cardiovascular complication will be avoided Outcome: Progressing   Problem: Activity: Goal: Risk for activity intolerance will decrease Outcome: Progressing   Problem: Nutrition: Goal: Adequate nutrition will be maintained Outcome: Progressing   Problem: Coping: Goal: Level of anxiety will decrease Outcome: Progressing   Problem: Elimination: Goal: Will not experience complications related to bowel motility Outcome: Progressing Goal: Will not experience complications related to urinary retention Outcome: Progressing   Problem: Pain Managment: Goal: General experience of comfort will improve and/or be controlled Outcome: Progressing   Problem: Safety: Goal: Ability to remain free from injury will improve Outcome: Progressing   Problem: Skin Integrity: Goal: Risk for impaired skin integrity will decrease Outcome: Progressing

## 2023-12-25 NOTE — Assessment & Plan Note (Signed)
 Pneumonia  Volume Overload  Decompensated respiratory status requiring BiPAP in the setting of volume overload associated with ESRD and partial dialysis regimen as well as concern for possible multifocal pneumonia.  Procalcitonin at 0.23 Predominantly HD dependent volume status  Much improved oxygen requirement, able to wean from BiPAP after getting 2 back-to-back dialysis sessions. CTA was negative for PE, did show multifocal pneumonia with small bilateral pleural effusions.  Respiratory panel negative  Preliminary blood cultures negative. -Continue IV Rocephin  azithromycin  for infectious coverage Pending urine studies for strep pneumo and Legionella -Continue supplemental oxygen-wean as tolerated

## 2023-12-25 NOTE — Progress Notes (Signed)
 [facility]  ROUNDING NOTE   Subjective:   Gregory Crane is a 53 y.o. with past medical conditions including diabetes, hypertension, CVA, and end stage renal disease. Patient presents to the emergency department with increased shortness of breath. He has been admitted for Acute respiratory failure with hypoxia (HCC) [J96.01]  Patient is known to our practice and currently receives outpatient dialysis at [facility] supervised by Dr. Rhesa Celeste.    Update: The patient now off of BiPAP. Due for additional dialysis treatment today. Significant ultrafiltration performed yesterday at 8.2 L.   Objective:  Vital signs in last 24 hours:  Temp:  [97.9 F (36.6 C)-98.9 F (37.2 C)] 98.1 F (36.7 C) (05/01 1410) Pulse Rate:  [51-81] 65 (05/01 1500) Resp:  [15-26] 15 (05/01 1500) BP: (87-173)/(45-93) 172/70 (05/01 1500) SpO2:  [91 %-100 %] 93 % (05/01 1500) FiO2 (%):  [35 %] 35 % (04/30 2352) Weight:  [114.7 kg] 114.7 kg (05/01 0600)  Weight change: 1.301 kg Filed Weights   12/23/23 1536 12/24/23 0623 12/25/23 0600  Weight: 122.1 kg 119.3 kg 114.7 kg    Intake/Output: I/O last 3 completed shifts: In: 1014.4 [P.O.:240; I.V.:424.4; IV Piggyback:350] Out: 8200 [Urine:450; Other:7750]   Intake/Output this shift:  No intake/output data recorded.  Physical Exam: General: No acute distress  Head: Normocephalic, atraumatic.   Eyes: Anicteric  Lungs:  Bilateral rales and wheezing  Heart: Irregular rate and rhythm  Abdomen:  Soft, nontender, obese  Extremities: 2+ peripheral edema.  Neurologic: Alert and oriented, moving all four extremities  Skin: No lesions  Access: aVF    Basic Metabolic Panel: Recent Labs  Lab 12/23/23 0814 12/24/23 0630  NA 136 138  K 6.2* 3.6  CL 107 99  CO2 19* 24  GLUCOSE 226* 108*  BUN 88* 56*  CREATININE 10.42* 6.52*  CALCIUM  7.6* 8.1*    Liver Function Tests: Recent Labs  Lab 12/23/23 0813 12/24/23 0630  AST 17 16  ALT 15  13  ALKPHOS 79 60  BILITOT 0.8 0.8  PROT 8.4* 7.3  ALBUMIN 4.4 3.7   No results for input(s): "LIPASE", "AMYLASE" in the last 168 hours. No results for input(s): "AMMONIA" in the last 168 hours.  CBC: Recent Labs  Lab 12/23/23 0814 12/24/23 0630  WBC 11.2* 10.5  NEUTROABS 10.5*  --   HGB 12.4* 10.3*  HCT 38.0* 29.9*  MCV 94.8 91.7  PLT 194 174    Cardiac Enzymes: No results for input(s): "CKTOTAL", "CKMB", "CKMBINDEX", "TROPONINI" in the last 168 hours.  BNP: Invalid input(s): "POCBNP"  CBG: Recent Labs  Lab 12/24/23 0750 12/24/23 1117 12/24/23 2055 12/25/23 0750 12/25/23 1130  GLUCAP 121* 115* 142* 97 172*    Microbiology: Results for orders placed or performed during the hospital encounter of 12/23/23  Blood Culture (routine x 2)     Status: None (Preliminary result)   Collection Time: 12/23/23  8:52 AM   Specimen: BLOOD  Result Value Ref Range Status   Specimen Description BLOOD BLOOD RIGHT ARM  Final   Special Requests   Final    BOTTLES DRAWN AEROBIC AND ANAEROBIC Blood Culture adequate volume   Culture   Final    NO GROWTH 2 DAYS Performed at [facility], [address] [place], [place] [address]    Report Status PENDING  Incomplete  Blood Culture (routine x 2)     Status: None (Preliminary result)   Collection Time: 12/23/23 10:25 AM   Specimen: BLOOD  Result  Value Ref Range Status   Specimen Description BLOOD RIGHT [facility]  Final   Special Requests   Final    BOTTLES DRAWN AEROBIC AND ANAEROBIC Blood Culture adequate volume   Culture   Final    NO GROWTH 2 DAYS Performed at [facility], [address] [place], [place] [address]    Report Status PENDING  Incomplete  MRSA Next Gen by PCR, Nasal     Status: None   Collection Time: 12/23/23  5:00 PM   Specimen: Nasal Mucosa; Nasal Swab  Result Value Ref Range Status   MRSA by PCR Next Gen NOT DETECTED NOT DETECTED Final    Comment: (NOTE) The GeneXpert MRSA Assay (FDA  approved for NASAL specimens only), is one component of a comprehensive MRSA colonization surveillance program. It is not intended to diagnose MRSA infection nor to guide or monitor treatment for MRSA infections. Test performance is not FDA approved in patients less than 90 years old. Performed at [facility], [address] [place], [place] [address]   Respiratory (~20 pathogens) panel by PCR     Status: None   Collection Time: 12/23/23  6:02 PM   Specimen: Nasopharyngeal Swab; Respiratory  Result Value Ref Range Status   Adenovirus NOT DETECTED NOT DETECTED Final   Coronavirus 229E NOT DETECTED NOT DETECTED Final    Comment: (NOTE) The Coronavirus on the Respiratory Panel, DOES NOT test for the novel  Coronavirus (2019 nCoV)    Coronavirus HKU1 NOT DETECTED NOT DETECTED Final   Coronavirus NL63 NOT DETECTED NOT DETECTED Final   Coronavirus OC43 NOT DETECTED NOT DETECTED Final   Metapneumovirus NOT DETECTED NOT DETECTED Final   Rhinovirus / Enterovirus NOT DETECTED NOT DETECTED Final   Influenza A NOT DETECTED NOT DETECTED Final   Influenza B NOT DETECTED NOT DETECTED Final   Parainfluenza Virus 1 NOT DETECTED NOT DETECTED Final   Parainfluenza Virus 2 NOT DETECTED NOT DETECTED Final   Parainfluenza Virus 3 NOT DETECTED NOT DETECTED Final   Parainfluenza Virus 4 NOT DETECTED NOT DETECTED Final   Respiratory Syncytial Virus NOT DETECTED NOT DETECTED Final   Bordetella pertussis NOT DETECTED NOT DETECTED Final   Bordetella Parapertussis NOT DETECTED NOT DETECTED Final   Chlamydophila pneumoniae NOT DETECTED NOT DETECTED Final   Mycoplasma pneumoniae NOT DETECTED NOT DETECTED Final    Comment: Performed at [facility], [address] [place], [place] [address]  Resp panel by RT-PCR (RSV, Flu A&B, Covid) Anterior Nasal Swab     Status: None   Collection Time: 12/24/23  6:01 PM   Specimen: Anterior Nasal Swab  Result Value Ref Range Status   SARS Coronavirus 2  by RT PCR NEGATIVE NEGATIVE Final    Comment: (NOTE) SARS-CoV-2 target nucleic acids are NOT DETECTED.  The SARS-CoV-2 RNA is generally detectable in upper respiratory specimens during the acute phase of infection. The lowest concentration of SARS-CoV-2 viral copies this assay can detect is 138 copies/mL. A negative result does not preclude SARS-Cov-2 infection and should not be used as the sole basis for treatment or other patient management decisions. A negative result may occur with  improper specimen collection/handling, submission of specimen other than nasopharyngeal swab, presence of viral mutation(s) within the areas targeted by this assay, and inadequate number of viral copies(<138 copies/mL). A negative result must be combined with clinical observations, patient history, and epidemiological information. The expected result is Negative.  Fact Sheet for Patients:  BloggerCourse.com  Fact Sheet for  Healthcare Providers:  SeriousBroker.it  This test is no t yet approved or cleared by the [place]  FDA and  has been authorized for detection and/or diagnosis of SARS-CoV-2 by FDA under an Emergency Use Authorization (EUA). This EUA will remain  in effect (meaning this test can be used) for the duration of the COVID-19 declaration under Section 564(b)(1) of the Act, 21 U.S.C.section 360bbb-3(b)(1), unless the authorization is terminated  or revoked sooner.       Influenza A by PCR NEGATIVE NEGATIVE Final   Influenza B by PCR NEGATIVE NEGATIVE Final    Comment: (NOTE) The Xpert Xpress SARS-CoV-2/FLU/RSV plus assay is intended as an aid in the diagnosis of influenza from Nasopharyngeal swab specimens and should not be used as a sole basis for treatment. Nasal washings and aspirates are unacceptable for Xpert Xpress SARS-CoV-2/FLU/RSV testing.  Fact Sheet for Patients: BloggerCourse.com  Fact  Sheet for Healthcare Providers: SeriousBroker.it  This test is not yet approved or cleared by the [place]  FDA and has been authorized for detection and/or diagnosis of SARS-CoV-2 by FDA under an Emergency Use Authorization (EUA). This EUA will remain in effect (meaning this test can be used) for the duration of the COVID-19 declaration under Section 564(b)(1) of the Act, 21 U.S.C. section 360bbb-3(b)(1), unless the authorization is terminated or revoked.     Resp Syncytial Virus by PCR NEGATIVE NEGATIVE Final    Comment: (NOTE) Fact Sheet for Patients: BloggerCourse.com  Fact Sheet for Healthcare Providers: SeriousBroker.it  This test is not yet approved or cleared by the [place]  FDA and has been authorized for detection and/or diagnosis of SARS-CoV-2 by FDA under an Emergency Use Authorization (EUA). This EUA will remain in effect (meaning this test can be used) for the duration of the COVID-19 declaration under Section 564(b)(1) of the Act, 21 U.S.C. section 360bbb-3(b)(1), unless the authorization is terminated or revoked.  Performed at [facility], [address] [place], [place] [address]    *Note: Due to a large number of results and/or encounters for the requested time period, some results have not been displayed. A complete set of results can be found in Results Review.    Coagulation Studies: No results for input(s): "LABPROT", "INR" in the last 72 hours.  Urinalysis: No results for input(s): "COLORURINE", "LABSPEC", "PHURINE", "GLUCOSEU", "HGBUR", "BILIRUBINUR", "KETONESUR", "PROTEINUR", "UROBILINOGEN", "NITRITE", "LEUKOCYTESUR" in the last 72 hours.  Invalid input(s): "APPERANCEUR"    Imaging: ECHOCARDIOGRAM COMPLETE Result Date: 12/25/2023    ECHOCARDIOGRAM REPORT   Patient Name:   [facility] Date of Exam: 12/25/2023 Medical Rec #:  [id number]      Height:        72.0 in Accession #:    [id number]     Weight:       252.9 lb Date of Birth:  June 01, 1971     BSA:          2.354 m Patient Age:    52 years       BP:           143/82 mmHg Patient Gender: M              HR:           60 bpm. Exam Location:  [facility] Procedure: 2D Echo, Cardiac Doppler and Color Doppler (Both Spectral and Color            Flow Doppler were utilized during procedure). Indications:     Acute respiratory distress R06.03  History:  Patient has prior history of Echocardiogram examinations, most                  recent 07/02/2023. Stroke; Risk Factors:Diabetes and                  Hypertension.  Sonographer:     Broadus Canes Referring Phys:  [phone] CHRISTOPHER END Diagnosing Phys: Timothy Gollan MD IMPRESSIONS  1. Left ventricular ejection fraction, by estimation, is 60 to 65%. The left ventricle has normal function. The left ventricle has no regional wall motion abnormalities. There is mild left ventricular hypertrophy. Left ventricular diastolic parameters were normal.  2. Right ventricular systolic function is normal. The right ventricular size is normal.  3. The mitral valve is normal in structure. Mild mitral valve regurgitation. No evidence of mitral stenosis.  4. The aortic valve is tricuspid. There is mild calcification of the aortic valve. Aortic valve regurgitation is not visualized. Aortic valve sclerosis is present, with no evidence of aortic valve stenosis.  5. The inferior vena cava is normal in size with greater than 50% respiratory variability, suggesting right atrial pressure of 3 mmHg. FINDINGS  Left Ventricle: Left ventricular ejection fraction, by estimation, is 60 to 65%. The left ventricle has normal function. The left ventricle has no regional wall motion abnormalities. Strain was performed and the global longitudinal strain is indeterminate. The left ventricular internal cavity size was normal in size. There is mild left ventricular hypertrophy. Left ventricular diastolic parameters  were normal. Right Ventricle: The right ventricular size is normal. No increase in right ventricular wall thickness. Right ventricular systolic function is normal. Left Atrium: Left atrial size was normal in size. Right Atrium: Right atrial size was normal in size. Pericardium: There is no evidence of pericardial effusion. Mitral Valve: The mitral valve is normal in structure. Mild mitral valve regurgitation. No evidence of mitral valve stenosis. MV peak gradient, 8.0 mmHg. The mean mitral valve gradient is 3.0 mmHg. Tricuspid Valve: The tricuspid valve is normal in structure. Tricuspid valve regurgitation is not demonstrated. No evidence of tricuspid stenosis. Aortic Valve: The aortic valve is tricuspid. There is mild calcification of the aortic valve. Aortic valve regurgitation is not visualized. Aortic valve sclerosis is present, with no evidence of aortic valve stenosis. Aortic valve mean gradient measures 4.0 mmHg. Aortic valve peak gradient measures 6.9 mmHg. Aortic valve area, by VTI measures 2.51 cm. Pulmonic Valve: The pulmonic valve was normal in structure. Pulmonic valve regurgitation is not visualized. No evidence of pulmonic stenosis. Aorta: The aortic root is normal in size and structure. Venous: The inferior vena cava is normal in size with greater than 50% respiratory variability, suggesting right atrial pressure of 3 mmHg. IAS/Shunts: No atrial level shunt detected by color flow Doppler. Additional Comments: 3D was performed not requiring image post processing on an independent workstation and was indeterminate.  LEFT VENTRICLE PLAX 2D LVIDd:         4.40 cm   Diastology LVIDs:         2.70 cm   LV e' medial:    6.09 cm/s LV PW:         1.20 cm   LV E/e' medial:  21.7 LV IVS:        1.60 cm   LV e' lateral:   6.85 cm/s LVOT diam:     2.20 cm   LV E/e' lateral: 19.3 LV SV:         81 LV SV Index:  34 LVOT Area:     3.80 cm  RIGHT VENTRICLE RV Basal diam:  3.70 cm RV Mid diam:    3.00 cm LEFT  ATRIUM           Index        RIGHT ATRIUM           Index LA diam:      3.00 cm 1.27 cm/m   RA Area:     18.60 cm LA Vol (A2C): 54.5 ml 23.15 ml/m  RA Volume:   47.40 ml  20.14 ml/m LA Vol (A4C): 66.9 ml 28.42 ml/m  AORTIC VALVE AV Area (Vmax):    2.59 cm AV Area (Vmean):   2.30 cm AV Area (VTI):     2.51 cm AV Vmax:           131.00 cm/s AV Vmean:          97.100 cm/s AV VTI:            0.321 m AV Peak Grad:      6.9 mmHg AV Mean Grad:      4.0 mmHg LVOT Vmax:         89.40 cm/s LVOT Vmean:        58.700 cm/s LVOT VTI:          0.212 m LVOT/AV VTI ratio: 0.66  AORTA Ao Root diam: 2.80 cm MITRAL VALVE                TRICUSPID VALVE MV Area (PHT): 4.08 cm     TR Peak grad:   28.3 mmHg MV Area VTI:   2.65 cm     TR Vmax:        266.00 cm/s MV Peak grad:  8.0 mmHg MV Mean grad:  3.0 mmHg     SHUNTS MV Vmax:       1.41 m/s     Systemic VTI:  0.21 m MV Vmean:      73.3 cm/s    Systemic Diam: 2.20 cm MV Decel Time: 186 msec MV E velocity: 132.00 cm/s MV A velocity: 79.70 cm/s MV E/A ratio:  1.66 Belva Boyden MD Electronically signed by Belva Boyden MD Signature Date/Time: 12/25/2023/3:06:16 PM    Final    CT Angio Chest Pulmonary Embolism (PE) W or WO Contrast Result Date: 12/24/2023 EXAM: CTA of the Chest with contrast for PE 12/24/2023 08:23:41 PM TECHNIQUE: CTA of the chest was performed after the administration of intravenous contrast. Multiplanar reformatted images are provided for review. MIP images are provided for review. Automated exposure control, iterative reconstruction, and/or weight based adjustment of the mA/kV was utilized to reduce the radiation dose to as low as reasonably achievable. COMPARISON: CT chest dated 12/23/2023. CLINICAL HISTORY: Pulmonary embolism (PE) suspected, low to intermediate prob, positive D-dimer; Patient is on dialysis. 53 y.o. male with medical history significant of obesity, type 2 Diabetes, ESRD on HD MWF, HTN, hypothyroidism, CVA, bells palsy presenting w/ acute  resp failure with hypoxia, volume overload, PNA, afib w/ RVR. Pt reports increased WOB over the past 1 to 2 days. Patient reports only partial compliance with hemodialysis regimen. Last dialysis was 5 days ago. Patient also missed doses of Eliquis . FINDINGS: PULMONARY ARTERIES: Pulmonary arteries are adequately opacified for evaluation. No evidence of pulmonary embolism. Main pulmonary artery is normal in caliber. MEDIASTINUM: Mild cardiomegaly. No evidence of mediastinal lymphadenopathy. There is no acute abnormality of the thoracic aorta. LYMPH NODES: No evidence of mediastinal, hilar or  axillary lymphadenopathy. LUNGS AND PLEURA: Multifocal pneumonia, right upper lobe predominant, unchanged. Small bilateral pleural effusions, right greater than left, unchanged. No evidence of pneumothorax. UPPER ABDOMEN: Prior cholecystectomy. Limited images of the upper abdomen are otherwise unremarkable. SOFT TISSUES AND BONES: Degenerative changes of the visualized thoracolumbar spine. No acute bone or soft tissue abnormality. IMPRESSION: 1. No evidence of pulmonary embolism. 2. Multifocal pneumonia with small bilateral pleural effusions, unchanged. Electronically signed by: Sriyesh Krishnan MD 12/24/2023 08:37 PM EDT RP Workstation: [id number]     Medications:    azithromycin  500 mg (12/25/23 1059)   cefTRIAXone  (ROCEPHIN )  IV 2 g (12/25/23 0915)    amiodarone   200 mg Oral BID   apixaban   5 mg Oral BID   Chlorhexidine  Gluconate Cloth  6 each Topical Q0600   diltiazem   120 mg Oral Daily   hydrALAZINE   25 mg Oral BID   insulin  aspart  0-9 Units Subcutaneous TID WC   levothyroxine   175 mcg Oral Q0600   metoprolol  tartrate  25 mg Oral BID   pregabalin   100 mg Oral Daily   rosuvastatin   5 mg Oral Daily   sevelamer  carbonate  800 mg Oral TID WC   sodium chloride  flush  3 mL Intravenous Q12H   acetaminophen , metoprolol  tartrate, ondansetron  **OR** ondansetron  (ZOFRAN ) IV, pentafluoroprop-tetrafluoroeth, sodium  chloride flush  Assessment/ Plan:  [name] is a 53 y.o.  male past medical conditions including diabetes, hypertension, CVA, and end stage renal disease. Patient presents to the emergency department with increased shortness of breath. He has been admitted for Acute respiratory failure with hypoxia (HCC) [J96.01]  [facility]/TTS/aVF  End-stage renal disease with hyperkalemia on hemodialysis.  Last treatment received on Saturday.  Noncompliance with scheduled dialysis for the past month.  Severely volume overloaded.  Potassium 6.2, patient received Lokelma  and shifting measures in emergency department.    Patient underwent 2 dialysis sessions yesterday and ultrafiltration achieved was 8.2 kg in total.  We are planning for additional dialysis treatment today with UF target of 3 kg as tolerated.  2.  Acute respiratory failure due to fluid overload and pneumonia.  Currently off of BiPAP.  We are planning for additional ultrafiltration as above.  Continue azithromycin  and ceftriaxone  for pneumonia.  3.  Atrial fibrillation with RVR, heart rate ranges from 90s to 180s. Patient states he inconsistently follows with outpatient cardiology.    Continue amiodarone .  4. Anemia of chronic kidney disease Lab Results  Component Value Date   HGB 10.3 (L) 12/24/2023    Hemoglobin at goal.  No need for ESA's at this time.  5. Secondary Hyperparathyroidism: with outpatient labs: Unavailable  Lab Results  Component Value Date   CALCIUM  8.1 (L) 12/24/2023   PHOS 6.8 (H) 05/01/2023    Monitor bone metabolism parameters over the course of hospitalization.  Otherwise maintain the patient on Renvela  800 mg 3 times daily.   LOS: 1 Vennessa Affinito 5/1/20253:32 PM

## 2023-12-25 NOTE — Assessment & Plan Note (Signed)
 Patient initially presented with concern of A-fib with RVR, cardiology was consulted and it looks like he was having SVT/AT Started on amiodarone  infusion-currently in sinus rhythm Admitted and infusion was discontinued for concern of becoming bradycardic, now placed on p.o. amiodarone  -Continue with Eliquis 

## 2023-12-25 NOTE — Assessment & Plan Note (Signed)
 Baseline ESRD with hemodialysis partial compliance Only going to dialysis on Tuesdays and Thursdays Current presentation with acute volume overload requiring BiPAP HD dependent volume status pending dialysis today Discussed importance of TTS hemodialysis Patient expressed understanding - Had 2 back-to-back dialysis sessions since admission and going for another 1 today -Nephrology is on board and will monitor dialysis

## 2023-12-25 NOTE — Progress Notes (Signed)
*  PRELIMINARY RESULTS* Echocardiogram 2D Echocardiogram has been performed.  Gregory Crane 12/25/2023, 8:40 AM

## 2023-12-25 NOTE — Assessment & Plan Note (Signed)
 Blood pressure within goal - Continue home hydralazine  -Keep holding amlodipine  for concern of acute CHF-we will resume after echocardiogram

## 2023-12-25 NOTE — Progress Notes (Signed)
   Patient Name: Gregory Crane Date of Encounter: 12/25/2023 [facility] Cardiologist: Wendie Hamburg, MD   Interval Summary  .    Patient had HD yesterday and pulled off 8L. He is feeling much better today. IV amio held overnight for bradycardia. Tele shows NSR with very brief SVT this AM. He denies chest pain or SOB. He makes minimal urine.   Vital Signs .    Vitals:   12/25/23 0600 12/25/23 0700 12/25/23 0800 12/25/23 0905  BP:  (!) 154/67 (!) 143/64 (!) 156/87  Pulse:  69 65   Resp:  19 17   Temp: 97.9 F (36.6 C)     TempSrc: Oral     SpO2:  100% 99%   Weight: 114.7 kg     Height:        Intake/Output Summary (Last 24 hours) at 12/25/2023 0917 Last data filed at 12/25/2023 0600 Gross per 24 hour  Intake 753.53 ml  Output 4200 ml  Net -3446.47 ml      12/25/2023    6:00 AM 12/24/2023    6:23 AM 12/23/2023    3:36 PM  Last 3 Weights  Weight (lbs) 252 lb 13.9 oz 263 lb 0.1 oz 269 lb 2.9 oz  Weight (kg) 114.7 kg 119.3 kg 122.1 kg      Telemetry/ECG    NSR HR 50-60s brief SVT this AM - Personally Reviewed  Physical Exam .   GEN: No acute distress.   Neck: No JVD Cardiac: RRR, no murmurs, rubs, or gallops.  Respiratory: Clear to auscultation bilaterally. GI: Soft, nontender, non-distended  MS: No edema  Assessment & Plan .    Acute on chronic HFpEF Acute respiratory failure with hypoxia - presented 4/29 with respiratory failure in the setting of heart failure and volume overload from ESRD/ inadequate HD and multifocal PNA - BNP 1683 - s/p IV lasix  120mg  on 4/29 - has undergone 2 HD sessions, yesterday pulled 8L most recently - he makes urine and was only doing HD twice weekly, may need diuretic as outpatient  PNA - abx per IM  SVT Afib/flutter - IV amio held for bradycardia overnight - brief SVT this AM, but mostly in NSR - plan to change IV amio to oral amiodarone   - continue Lopressor  25mg  BID - continue Eliquis  5mg  BID  ESRD -  management per nephrology - he does HD twce weekly as OP  Elevated troponin - HS trop mildly elevated, suspected supply demand mismatch given HFpEF, tachycardia, and respiratory failure  - no plans for ischemic work-up at this time - no ASA given Eliquis  - continue Crestor   For questions or updates, please contact [facility] Please consult [email or web address] for contact info under        Signed, Gloria Ricardo Rebekah Canada, PA-C

## 2023-12-25 NOTE — Assessment & Plan Note (Signed)
 Hyperglycemia on presentation, he was not on any antidiabetic medications recently.  A1c of 6.4 in February 2025, repeat A1c 6.2 -Continue with SSI

## 2023-12-25 NOTE — Progress Notes (Signed)
 PT Cancellation Note  Patient Details Name: Gregory Crane MRN: [id number] DOB: 02-07-1971   Cancelled Treatment:    Reason Eval/Treat Not Completed: Patient at procedure or test/unavailable (Order received, chart reviewed; Pt OTF upon attempt, down for HD. Will attempt again next day.)  4:18 PM, 12/25/23 Dawn Eth, PT, DPT Physical Therapist - [facility]  [phone] (ASCOM)    Lorren Splawn C 12/25/2023, 4:18 PM

## 2023-12-25 NOTE — Progress Notes (Addendum)
 Progress Note   Patient: Gregory Crane ION:[id number] DOB: 07-May-1971 DOA: 12/23/2023     1 DOS: the patient was seen and examined on 12/25/2023   Brief hospital course: Taken from H&P.  Kiyaan Blankenbiller is a 53 y.o. male with medical history significant of obesity, type 2 Diabetes, ESRD on HD MWF, HTN, hypothyroidism, CVA, bells palsy presenting w/ acute resp failure with hypoxia, volume overload, PNA, afib w/ RVR. Pt reports increased WOB over the past 1 to 2 days.  Patient reports only partial compliance with hemodialysis regimen.  Last dialysis was 5 days ago.  Patient also missed doses of Eliquis .  Does not follow fluid restriction  On presentation tachycardic, was placed on BiPAP, and labs with leukocytosis at 11.2, creatinine 10.4, glucose 226, potassium 6.2, troponin 20 Chest x-ray with bilateral opacities suspicious for multifocal pneumonia. EKG with concern of SVT/atrial tachycardia. Cardiology and nephrology was consulted-patient was taken for emergent dialysis.  Patient was started on amiodarone  by cardiology with resultant improvement in heart rate.  4/30: Still tachypneic and borderline hypoxic on BiPAP.  CT chest yesterday with concern of multifocal pneumonia and small bilateral pleural effusions.  Procalcitonin at 0.23, troponin with a flat curve and barely positive, leukocytosis resolved.  BNP significantly elevated at 1683, more than his baseline.  Nephrology to do another session of dialysis today. Having difficulty weaning him off from [facility] CTA chest to rule out PE as patient was not taking his Eliquis  consistently.  Echocardiogram pending.  5/1: Hemodynamically stable, had another dialysis yesterday evening with UF of 3.5 L.  Respiratory panels negative.  A1c of 6.2.  Admitted on infusion is being switched with p.o.  Going for another session of dialysis today.  Echocardiogram done with pending results.  Improved oxygen requirement.   Assessment and Plan: * Acute  respiratory failure with hypoxia (HCC) Pneumonia  Volume Overload  Decompensated respiratory status requiring BiPAP in the setting of volume overload associated with ESRD and partial dialysis regimen as well as concern for possible multifocal pneumonia.  Procalcitonin at 0.23 Predominantly HD dependent volume status  Much improved oxygen requirement, able to wean from BiPAP after getting 2 back-to-back dialysis sessions. CTA was negative for PE, did show multifocal pneumonia with small bilateral pleural effusions.  Respiratory panel negative  Preliminary blood cultures negative. -Continue IV Rocephin  azithromycin  for infectious coverage Pending urine studies for strep pneumo and Legionella -Continue supplemental oxygen-wean as tolerated  Atrial fibrillation with rapid ventricular response (HCC) Patient initially presented with concern of A-fib with RVR, cardiology was consulted and it looks like he was having SVT/AT Started on amiodarone  infusion-currently in sinus rhythm Admitted and infusion was discontinued for concern of becoming bradycardic, now placed on p.o. amiodarone  -Continue with Eliquis   ESRD on hemodialysis (HCC) Baseline ESRD with hemodialysis partial compliance Only going to dialysis on Tuesdays and Thursdays Current presentation with acute volume overload requiring BiPAP HD dependent volume status pending dialysis today Discussed importance of TTS hemodialysis Patient expressed understanding - Had 2 back-to-back dialysis sessions since admission and going for another 1 today -Nephrology is on board and will monitor dialysis  History of CVA (cerebrovascular accident) Remote history of CVA w/ chronic L sided weakness.  At baseline. Cont home statin Patient is on Eliquis  instead of aspirin   Hypertension Blood pressure within goal - Continue home hydralazine  -Keep holding amlodipine  for concern of acute CHF-we will resume after echocardiogram  Type 2 diabetes  mellitus, with long-term current use of insulin  (HCC) Hyperglycemia on presentation,  he was not on any antidiabetic medications recently.  A1c of 6.4 in February 2025, repeat A1c 6.2 -Continue with SSI   Subjective: Patient was feeling much improved when seen today.  On 2 L of oxygen.  We again discussed compliance with dialysis schedule.  Physical Exam: Vitals:   12/25/23 0700 12/25/23 0800 12/25/23 0905 12/25/23 1053  BP: (!) 154/67 (!) 143/64 (!) 156/87 138/66  Pulse: 69 65  72  Resp: 19 17    Temp:      TempSrc:      SpO2: 100% 99%    Weight:      Height:       General.  Obese gentleman, in no acute distress. Pulmonary.  Few scattered crackles bilaterally, normal respiratory effort. CV.  Regular rate and rhythm, no JVD, rub or murmur. Abdomen.  Soft, nontender, nondistended, BS positive. CNS.  Alert and oriented .  No focal neurologic deficit. Extremities.  No edema, no cyanosis, pulses intact and symmetrical. Psychiatry.  Judgment and insight appears normal.   Data Reviewed: Prior data reviewed  Family Communication: Discussed with patient  Disposition: Status is: Inpatient Remains inpatient appropriate because: Severity of illness  Planned Discharge Destination: Home  ED prophylaxis.  Eliquis  Time spent: 50 minutes  This record has been created using Conservation officer, historic buildings. Errors have been sought and corrected,but may not always be located. Such creation errors do not reflect on the standard of care.   Author: Luna Salinas, MD 12/25/2023 1:23 PM  For on call review [email or web address].

## 2023-12-25 NOTE — Procedures (Signed)
 HD Note:  Some information was entered later than the data was gathered due to patient care needs. The stated time with the data is accurate.  Received patient in bed to unit.   Alert and oriented.   Informed consent signed and in chart.   Access used: Left upper arm fistula  Access issues: No issues  Patient tolerated treatment well.   TX duration: 3 hours  Alert, without acute distress.  Total UF removed: 3000 ml  Hand-off given to patient's nurse.   Transported back to the room   Allannah Kempen L. Alva Jewels, RN [facility].

## 2023-12-26 DIAGNOSIS — I5033 Acute on chronic diastolic (congestive) heart failure: Secondary | ICD-10-CM

## 2023-12-26 DIAGNOSIS — J189 Pneumonia, unspecified organism: Secondary | ICD-10-CM | POA: Diagnosis not present

## 2023-12-26 DIAGNOSIS — I10 Essential (primary) hypertension: Secondary | ICD-10-CM

## 2023-12-26 DIAGNOSIS — J9601 Acute respiratory failure with hypoxia: Secondary | ICD-10-CM | POA: Diagnosis not present

## 2023-12-26 DIAGNOSIS — Z8673 Personal history of transient ischemic attack (TIA), and cerebral infarction without residual deficits: Secondary | ICD-10-CM

## 2023-12-26 DIAGNOSIS — N186 End stage renal disease: Secondary | ICD-10-CM | POA: Diagnosis not present

## 2023-12-26 DIAGNOSIS — I4891 Unspecified atrial fibrillation: Secondary | ICD-10-CM | POA: Diagnosis not present

## 2023-12-26 LAB — BASIC METABOLIC PANEL WITH GFR
Anion gap: 16 — ABNORMAL HIGH (ref 5–15)
BUN: 59 mg/dL — ABNORMAL HIGH (ref 6–20)
CO2: 23 mmol/L (ref 22–32)
Calcium: 7.8 mg/dL — ABNORMAL LOW (ref 8.9–10.3)
Chloride: 97 mmol/L — ABNORMAL LOW (ref 98–111)
Creatinine, Ser: 7.01 mg/dL — ABNORMAL HIGH (ref 0.61–1.24)
GFR, Estimated: 9 mL/min — ABNORMAL LOW (ref >60)
Glucose, Bld: 120 mg/dL — ABNORMAL HIGH (ref 70–99)
Potassium: 3.9 mmol/L (ref 3.5–5.1)
Sodium: 136 mmol/L (ref 135–145)

## 2023-12-26 LAB — GLUCOSE, CAPILLARY
Glucose-Capillary: 102 mg/dL — ABNORMAL HIGH (ref 70–99)
Glucose-Capillary: 119 mg/dL — ABNORMAL HIGH (ref 70–99)

## 2023-12-26 LAB — CBC
HCT: 33.1 % — ABNORMAL LOW (ref 39.0–52.0)
Hemoglobin: 11.3 g/dL — ABNORMAL LOW (ref 13.0–17.0)
MCH: 31 pg (ref 26.0–34.0)
MCHC: 34.1 g/dL (ref 30.0–36.0)
MCV: 90.9 fL (ref 80.0–100.0)
Platelets: 168 10*3/uL (ref 150–400)
RBC: 3.64 MIL/uL — ABNORMAL LOW (ref 4.22–5.81)
RDW: 13.7 % (ref 11.5–15.5)
WBC: 5.3 10*3/uL (ref 4.0–10.5)
nRBC: 0 % (ref 0.0–0.2)

## 2023-12-26 MED ORDER — AMOXICILLIN-POT CLAVULANATE 250-125 MG PO TABS
1.0000 | ORAL_TABLET | Freq: Two times a day (BID) | ORAL | Status: DC
  Administered 2023-12-26: 1 via ORAL
  Filled 2023-12-26 (×2): qty 1

## 2023-12-26 MED ORDER — METOPROLOL TARTRATE 25 MG PO TABS
37.5000 mg | ORAL_TABLET | Freq: Two times a day (BID) | ORAL | Status: DC
  Filled 2023-12-26: qty 2

## 2023-12-26 MED ORDER — METOPROLOL TARTRATE 37.5 MG PO TABS: 37.5000 mg | ORAL_TABLET | Freq: Two times a day (BID) | ORAL | 0 refills | Status: DC

## 2023-12-26 MED ORDER — AMIODARONE HCL 200 MG PO TABS: 200.0000 mg | ORAL_TABLET | Freq: Two times a day (BID) | ORAL | 0 refills | Status: DC

## 2023-12-26 MED ORDER — DILTIAZEM HCL ER COATED BEADS 120 MG PO CP24: 120.0000 mg | ORAL_CAPSULE | Freq: Every day | ORAL | 1 refills | Status: AC

## 2023-12-26 MED ORDER — AMOXICILLIN-POT CLAVULANATE 250-125 MG PO TABS: 1.0000 | ORAL_TABLET | Freq: Two times a day (BID) | ORAL | 0 refills | Status: AC

## 2023-12-26 NOTE — Progress Notes (Addendum)
   Patient Name: Gregory Crane Date of Encounter: 12/26/2023 [facility] Cardiologist: Wendie Hamburg, MD   Interval Summary  .    Patient is overall feeling good. Tele shows mostly NSR with 1 short brief run of SVT noted. He denies chest pain or SOB. He is wanting to get up and walk.   Vital Signs .    Vitals:   12/25/23 1732 12/25/23 1756 12/25/23 2106 12/25/23 2237  BP:  (!) 165/80 (!) 155/75 (!) 155/75  Pulse: (P) 71 71 74 74  Resp: (P) 19 (!) 21 20   Temp:   98.4 F (36.9 C)   TempSrc:   Oral   SpO2: (P) 94% 94% 90%   Weight:      Height:        Intake/Output Summary (Last 24 hours) at 12/26/2023 0736 Last data filed at 12/26/2023 0600 Gross per 24 hour  Intake 100 ml  Output 3225 ml  Net -3125 ml      12/25/2023    6:00 AM 12/24/2023    6:23 AM 12/23/2023    3:36 PM  Last 3 Weights  Weight (lbs) 252 lb 13.9 oz 263 lb 0.1 oz 269 lb 2.9 oz  Weight (kg) 114.7 kg 119.3 kg 122.1 kg      Telemetry/ECG    NSR, PVCs/PACs, one brief run of SVT - Personally Reviewed  Physical Exam .   GEN: No acute distress.   Neck: No JVD Cardiac: RRR, no murmurs, rubs, or gallops.  Respiratory: Clear to auscultation bilaterally. GI: Soft, nontender, non-distended  MS: No edema  Assessment & Plan .    Acute on chronic HFpEF Acute respiratory failure with hypoxia - presented 4/29 with respiratory failure in the setting of heart failure and volume overload from ESRD/ inadequate HD and multifocal PNA - BNP 1683 - s/p IV lasix  120mg  on 4/29 - s/p 3 HD sessions with significant improvement outpatient - echo showed LVEF 60-65%, mild LVH, normal RVSF, mild MR, mild calcification of the aortic valve - he makes urine and was only doing HD twice weekly, may need diuretic as    SVT Afib/flutter - IV amio held for bradycardia overnight on 5/1 - brief SVT this AM, but mostly in NSR - continue oral amiodarone  200mg  BID - continue Lopressor  25mg  BID and diltiazem  120mg   daily. I will increase lopressor  to 37.5mg  BID - continue Eliquis  5mg  BID   PNA - abx per IM  ESRD - management per nephrology - he does HD twce weekly as OP   Elevated troponin - HS trop mildly elevated, suspected supply demand mismatch given HFpEF, tachycardia, and respiratory failure  - no plans for ischemic work-up at this time - Echo showed normal LVEF and no WMA - no ASA given Eliquis  - continue Crestor    HTN - BPS elevated - continue dilt 120mg  daily and hydralazine  25mg  BID - increase lopressor  as above - may need to increase hydralazine   For questions or updates, please contact [facility] Please consult [email or web address] for contact info under        Signed, Stashia Sia Rebekah Canada, PA-C

## 2023-12-26 NOTE — Progress Notes (Signed)
 [facility]  ROUNDING NOTE   Subjective:   Gregory Crane is a 53 y.o. with past medical conditions including diabetes, hypertension, CVA, and end stage renal disease. Patient presents to the emergency department with increased shortness of breath. He has been admitted for Acute respiratory failure with hypoxia (HCC) [J96.01]  Patient is known to our practice and currently receives outpatient dialysis at [facility] supervised by Dr. Rhesa Celeste.    Update: Patient seen sitting up in chair Appears well, room air Denies shortness of breath Generalized edema greatly improved  Objective:  Vital signs in last 24 hours:  Temp:  [98 F (36.7 C)-98.7 F (37.1 C)] 98 F (36.7 C) (05/02 1354) Pulse Rate:  [59-74] 59 (05/02 1354) Resp:  [15-21] 19 (05/02 1300) BP: (135-176)/(68-92) 145/68 (05/02 1354) SpO2:  [90 %-97 %] 96 % (05/02 1354)  Weight change:  Filed Weights   12/23/23 1536 12/24/23 0623 12/25/23 0600  Weight: 122.1 kg 119.3 kg 114.7 kg    Intake/Output: I/O last 3 completed shifts: In: 263.5 [P.O.:100; I.V.:163.5] Out: 7425 [Urine:425; Other:7000]   Intake/Output this shift:  Total I/O In: 5 [I.V.:5] Out: -   Physical Exam: General: No acute distress  Head: Normocephalic, atraumatic.   Eyes: Anicteric  Lungs:  Mild wheeze, normal effort  Heart: Irregular rate and rhythm  Abdomen:  Soft, nontender, obese  Extremities: Trace to 1+ peripheral edema.  Neurologic: Alert and oriented, moving all four extremities  Skin: No lesions  Access: aVF    Basic Metabolic Panel: Recent Labs  Lab 12/23/23 0814 12/24/23 0630 12/25/23 1501 12/26/23 0846  NA 136 138 135 136  K 6.2* 3.6 4.0 3.9  CL 107 99 98 97*  CO2 19* 24 21* 23  GLUCOSE 226* 108* 97 120*  BUN 88* 56* 80* 59*  CREATININE 10.42* 6.52* 8.98* 7.01*  CALCIUM  7.6* 8.1* 7.3* 7.8*  PHOS  --   --  8.4*  --     Liver Function Tests: Recent Labs  Lab 12/23/23 0813 12/24/23 0630 12/25/23 1501   AST 17 16  --   ALT 15 13  --   ALKPHOS 79 60  --   BILITOT 0.8 0.8  --   PROT 8.4* 7.3  --   ALBUMIN 4.4 3.7 3.3*   No results for input(s): "LIPASE", "AMYLASE" in the last 168 hours. No results for input(s): "AMMONIA" in the last 168 hours.  CBC: Recent Labs  Lab 12/23/23 0814 12/24/23 0630 12/25/23 1501 12/26/23 0846  WBC 11.2* 10.5 6.3 5.3  NEUTROABS 10.5*  --   --   --   HGB 12.4* 10.3* 10.3* 11.3*  HCT 38.0* 29.9* 31.2* 33.1*  MCV 94.8 91.7 94.0 90.9  PLT 194 174 157 168    Cardiac Enzymes: No results for input(s): "CKTOTAL", "CKMB", "CKMBINDEX", "TROPONINI" in the last 168 hours.  BNP: Invalid input(s): "POCBNP"  CBG: Recent Labs  Lab 12/25/23 1130 12/25/23 1810 12/25/23 2113 12/26/23 0742 12/26/23 1201  GLUCAP 172* 82 113* 102* 119*    Microbiology: Results for orders placed or performed during the hospital encounter of 12/23/23  Blood Culture (routine x 2)     Status: None (Preliminary result)   Collection Time: 12/23/23  8:52 AM   Specimen: BLOOD  Result Value Ref Range Status   Specimen Description BLOOD BLOOD RIGHT ARM  Final   Special Requests   Final    BOTTLES DRAWN AEROBIC AND ANAEROBIC Blood Culture adequate volume   Culture   Final  NO GROWTH 3 DAYS Performed at [facility], [address] [place], [place] [address]    Report Status PENDING  Incomplete  Blood Culture (routine x 2)     Status: None (Preliminary result)   Collection Time: 12/23/23 10:25 AM   Specimen: BLOOD  Result Value Ref Range Status   Specimen Description BLOOD RIGHT [facility]  Final   Special Requests   Final    BOTTLES DRAWN AEROBIC AND ANAEROBIC Blood Culture adequate volume   Culture   Final    NO GROWTH 3 DAYS Performed at [facility], [address], [place], [place] [address]    Report Status PENDING  Incomplete  MRSA Next Gen by PCR, Nasal     Status: None   Collection Time: 12/23/23  5:00 PM   Specimen: Nasal Mucosa; Nasal Swab   Result Value Ref Range Status   MRSA by PCR Next Gen NOT DETECTED NOT DETECTED Final    Comment: (NOTE) The GeneXpert MRSA Assay (FDA approved for NASAL specimens only), is one component of a comprehensive MRSA colonization surveillance program. It is not intended to diagnose MRSA infection nor to guide or monitor treatment for MRSA infections. Test performance is not FDA approved in patients less than 17 years old. Performed at [facility], [address] [place], [place] [address]   Respiratory (~20 pathogens) panel by PCR     Status: None   Collection Time: 12/23/23  6:02 PM   Specimen: Nasopharyngeal Swab; Respiratory  Result Value Ref Range Status   Adenovirus NOT DETECTED NOT DETECTED Final   Coronavirus 229E NOT DETECTED NOT DETECTED Final    Comment: (NOTE) The Coronavirus on the Respiratory Panel, DOES NOT test for the novel  Coronavirus (2019 nCoV)    Coronavirus HKU1 NOT DETECTED NOT DETECTED Final   Coronavirus NL63 NOT DETECTED NOT DETECTED Final   Coronavirus OC43 NOT DETECTED NOT DETECTED Final   Metapneumovirus NOT DETECTED NOT DETECTED Final   Rhinovirus / Enterovirus NOT DETECTED NOT DETECTED Final   Influenza A NOT DETECTED NOT DETECTED Final   Influenza B NOT DETECTED NOT DETECTED Final   Parainfluenza Virus 1 NOT DETECTED NOT DETECTED Final   Parainfluenza Virus 2 NOT DETECTED NOT DETECTED Final   Parainfluenza Virus 3 NOT DETECTED NOT DETECTED Final   Parainfluenza Virus 4 NOT DETECTED NOT DETECTED Final   Respiratory Syncytial Virus NOT DETECTED NOT DETECTED Final   Bordetella pertussis NOT DETECTED NOT DETECTED Final   Bordetella Parapertussis NOT DETECTED NOT DETECTED Final   Chlamydophila pneumoniae NOT DETECTED NOT DETECTED Final   Mycoplasma pneumoniae NOT DETECTED NOT DETECTED Final    Comment: Performed at [facility], [address] [place], [place] [address]  Resp panel by RT-PCR (RSV, Flu A&B, Covid) Anterior Nasal Swab      Status: None   Collection Time: 12/24/23  6:01 PM   Specimen: Anterior Nasal Swab  Result Value Ref Range Status   SARS Coronavirus 2 by RT PCR NEGATIVE NEGATIVE Final    Comment: (NOTE) SARS-CoV-2 target nucleic acids are NOT DETECTED.  The SARS-CoV-2 RNA is generally detectable in upper respiratory specimens during the acute phase of infection. The lowest concentration of SARS-CoV-2 viral copies this assay can detect is 138 copies/mL. A negative result does not preclude SARS-Cov-2 infection and should not be used as the sole basis for treatment or other patient management decisions. A negative result may occur with  improper specimen collection/handling, submission of specimen other  than nasopharyngeal swab, presence of viral mutation(s) within the areas targeted by this assay, and inadequate number of viral copies(<138 copies/mL). A negative result must be combined with clinical observations, patient history, and epidemiological information. The expected result is Negative.  Fact Sheet for Patients:  BloggerCourse.com  Fact Sheet for Healthcare Providers:  SeriousBroker.it  This test is no t yet approved or cleared by the [place]  FDA and  has been authorized for detection and/or diagnosis of SARS-CoV-2 by FDA under an Emergency Use Authorization (EUA). This EUA will remain  in effect (meaning this test can be used) for the duration of the COVID-19 declaration under Section 564(b)(1) of the Act, 21 U.S.C.section 360bbb-3(b)(1), unless the authorization is terminated  or revoked sooner.       Influenza A by PCR NEGATIVE NEGATIVE Final   Influenza B by PCR NEGATIVE NEGATIVE Final    Comment: (NOTE) The Xpert Xpress SARS-CoV-2/FLU/RSV plus assay is intended as an aid in the diagnosis of influenza from Nasopharyngeal swab specimens and should not be used as a sole basis for treatment. Nasal washings and aspirates are  unacceptable for Xpert Xpress SARS-CoV-2/FLU/RSV testing.  Fact Sheet for Patients: BloggerCourse.com  Fact Sheet for Healthcare Providers: SeriousBroker.it  This test is not yet approved or cleared by the [place]  FDA and has been authorized for detection and/or diagnosis of SARS-CoV-2 by FDA under an Emergency Use Authorization (EUA). This EUA will remain in effect (meaning this test can be used) for the duration of the COVID-19 declaration under Section 564(b)(1) of the Act, 21 U.S.C. section 360bbb-3(b)(1), unless the authorization is terminated or revoked.     Resp Syncytial Virus by PCR NEGATIVE NEGATIVE Final    Comment: (NOTE) Fact Sheet for Patients: BloggerCourse.com  Fact Sheet for Healthcare Providers: SeriousBroker.it  This test is not yet approved or cleared by the [place]  FDA and has been authorized for detection and/or diagnosis of SARS-CoV-2 by FDA under an Emergency Use Authorization (EUA). This EUA will remain in effect (meaning this test can be used) for the duration of the COVID-19 declaration under Section 564(b)(1) of the Act, 21 U.S.C. section 360bbb-3(b)(1), unless the authorization is terminated or revoked.  Performed at [facility], [address] [place], [place] [address]    *Note: Due to a large number of results and/or encounters for the requested time period, some results have not been displayed. A complete set of results can be found in Results Review.    Coagulation Studies: No results for input(s): "LABPROT", "INR" in the last 72 hours.  Urinalysis: No results for input(s): "COLORURINE", "LABSPEC", "PHURINE", "GLUCOSEU", "HGBUR", "BILIRUBINUR", "KETONESUR", "PROTEINUR", "UROBILINOGEN", "NITRITE", "LEUKOCYTESUR" in the last 72 hours.  Invalid input(s): "APPERANCEUR"    Imaging: ECHOCARDIOGRAM COMPLETE Result  Date: 12/25/2023    ECHOCARDIOGRAM REPORT   Patient Name:   Gregory Crane Date of Exam: 12/25/2023 Medical Rec #:  [id number]      Height:       72.0 in Accession #:    [id number]     Weight:       252.9 lb Date of Birth:  1970/10/04     BSA:          2.354 m Patient Age:    52 years       BP:           143/82 mmHg Patient Gender: M              HR:  60 bpm. Exam Location:  [facility] Procedure: 2D Echo, Cardiac Doppler and Color Doppler (Both Spectral and Color            Flow Doppler were utilized during procedure). Indications:     Acute respiratory distress R06.03  History:         Patient has prior history of Echocardiogram examinations, most                  recent 07/02/2023. Stroke; Risk Factors:Diabetes and                  Hypertension.  Sonographer:     Broadus Canes Referring Phys:  [phone] CHRISTOPHER END Diagnosing Phys: Timothy Gollan MD IMPRESSIONS  1. Left ventricular ejection fraction, by estimation, is 60 to 65%. The left ventricle has normal function. The left ventricle has no regional wall motion abnormalities. There is mild left ventricular hypertrophy. Left ventricular diastolic parameters were normal.  2. Right ventricular systolic function is normal. The right ventricular size is normal.  3. The mitral valve is normal in structure. Mild mitral valve regurgitation. No evidence of mitral stenosis.  4. The aortic valve is tricuspid. There is mild calcification of the aortic valve. Aortic valve regurgitation is not visualized. Aortic valve sclerosis is present, with no evidence of aortic valve stenosis.  5. The inferior vena cava is normal in size with greater than 50% respiratory variability, suggesting right atrial pressure of 3 mmHg. FINDINGS  Left Ventricle: Left ventricular ejection fraction, by estimation, is 60 to 65%. The left ventricle has normal function. The left ventricle has no regional wall motion abnormalities. Strain was performed and the global longitudinal strain is indeterminate.  The left ventricular internal cavity size was normal in size. There is mild left ventricular hypertrophy. Left ventricular diastolic parameters were normal. Right Ventricle: The right ventricular size is normal. No increase in right ventricular wall thickness. Right ventricular systolic function is normal. Left Atrium: Left atrial size was normal in size. Right Atrium: Right atrial size was normal in size. Pericardium: There is no evidence of pericardial effusion. Mitral Valve: The mitral valve is normal in structure. Mild mitral valve regurgitation. No evidence of mitral valve stenosis. MV peak gradient, 8.0 mmHg. The mean mitral valve gradient is 3.0 mmHg. Tricuspid Valve: The tricuspid valve is normal in structure. Tricuspid valve regurgitation is not demonstrated. No evidence of tricuspid stenosis. Aortic Valve: The aortic valve is tricuspid. There is mild calcification of the aortic valve. Aortic valve regurgitation is not visualized. Aortic valve sclerosis is present, with no evidence of aortic valve stenosis. Aortic valve mean gradient measures 4.0 mmHg. Aortic valve peak gradient measures 6.9 mmHg. Aortic valve area, by VTI measures 2.51 cm. Pulmonic Valve: The pulmonic valve was normal in structure. Pulmonic valve regurgitation is not visualized. No evidence of pulmonic stenosis. Aorta: The aortic root is normal in size and structure. Venous: The inferior vena cava is normal in size with greater than 50% respiratory variability, suggesting right atrial pressure of 3 mmHg. IAS/Shunts: No atrial level shunt detected by color flow Doppler. Additional Comments: 3D was performed not requiring image post processing on an independent workstation and was indeterminate.  LEFT VENTRICLE PLAX 2D LVIDd:         4.40 cm   Diastology LVIDs:         2.70 cm   LV e' medial:    6.09 cm/s LV PW:         1.20 cm   LV  E/e' medial:  21.7 LV IVS:        1.60 cm   LV e' lateral:   6.85 cm/s LVOT diam:     2.20 cm   LV E/e'  lateral: 19.3 LV SV:         81 LV SV Index:   34 LVOT Area:     3.80 cm  RIGHT VENTRICLE RV Basal diam:  3.70 cm RV Mid diam:    3.00 cm LEFT ATRIUM           Index        RIGHT ATRIUM           Index LA diam:      3.00 cm 1.27 cm/m   RA Area:     18.60 cm LA Vol (A2C): 54.5 ml 23.15 ml/m  RA Volume:   47.40 ml  20.14 ml/m LA Vol (A4C): 66.9 ml 28.42 ml/m  AORTIC VALVE AV Area (Vmax):    2.59 cm AV Area (Vmean):   2.30 cm AV Area (VTI):     2.51 cm AV Vmax:           131.00 cm/s AV Vmean:          97.100 cm/s AV VTI:            0.321 m AV Peak Grad:      6.9 mmHg AV Mean Grad:      4.0 mmHg LVOT Vmax:         89.40 cm/s LVOT Vmean:        58.700 cm/s LVOT VTI:          0.212 m LVOT/AV VTI ratio: 0.66  AORTA Ao Root diam: 2.80 cm MITRAL VALVE                TRICUSPID VALVE MV Area (PHT): 4.08 cm     TR Peak grad:   28.3 mmHg MV Area VTI:   2.65 cm     TR Vmax:        266.00 cm/s MV Peak grad:  8.0 mmHg MV Mean grad:  3.0 mmHg     SHUNTS MV Vmax:       1.41 m/s     Systemic VTI:  0.21 m MV Vmean:      73.3 cm/s    Systemic Diam: 2.20 cm MV Decel Time: 186 msec MV E velocity: 132.00 cm/s MV A velocity: 79.70 cm/s MV E/A ratio:  1.66 Belva Boyden MD Electronically signed by Belva Boyden MD Signature Date/Time: 12/25/2023/3:06:16 PM    Final    CT Angio Chest Pulmonary Embolism (PE) W or WO Contrast Result Date: 12/24/2023 EXAM: CTA of the Chest with contrast for PE 12/24/2023 08:23:41 PM TECHNIQUE: CTA of the chest was performed after the administration of intravenous contrast. Multiplanar reformatted images are provided for review. MIP images are provided for review. Automated exposure control, iterative reconstruction, and/or weight based adjustment of the mA/kV was utilized to reduce the radiation dose to as low as reasonably achievable. COMPARISON: CT chest dated 12/23/2023. CLINICAL HISTORY: Pulmonary embolism (PE) suspected, low to intermediate prob, positive D-dimer; Patient is on dialysis. 53  y.o. male with medical history significant of obesity, type 2 Diabetes, ESRD on HD MWF, HTN, hypothyroidism, CVA, bells palsy presenting w/ acute resp failure with hypoxia, volume overload, PNA, afib w/ RVR. Pt reports increased WOB over the past 1 to 2 days. Patient reports only partial compliance with hemodialysis regimen. Last dialysis was 5 days  ago. Patient also missed doses of Eliquis . FINDINGS: PULMONARY ARTERIES: Pulmonary arteries are adequately opacified for evaluation. No evidence of pulmonary embolism. Main pulmonary artery is normal in caliber. MEDIASTINUM: Mild cardiomegaly. No evidence of mediastinal lymphadenopathy. There is no acute abnormality of the thoracic aorta. LYMPH NODES: No evidence of mediastinal, hilar or axillary lymphadenopathy. LUNGS AND PLEURA: Multifocal pneumonia, right upper lobe predominant, unchanged. Small bilateral pleural effusions, right greater than left, unchanged. No evidence of pneumothorax. UPPER ABDOMEN: Prior cholecystectomy. Limited images of the upper abdomen are otherwise unremarkable. SOFT TISSUES AND BONES: Degenerative changes of the visualized thoracolumbar spine. No acute bone or soft tissue abnormality. IMPRESSION: 1. No evidence of pulmonary embolism. 2. Multifocal pneumonia with small bilateral pleural effusions, unchanged. Electronically signed by: Sriyesh Krishnan MD 12/24/2023 08:37 PM EDT RP Workstation: [id number]     Medications:      amiodarone   200 mg Oral BID   amoxicillin -clavulanate  1 tablet Oral Q12H   apixaban   5 mg Oral BID   Chlorhexidine  Gluconate Cloth  6 each Topical Q0600   diltiazem   120 mg Oral Daily   hydrALAZINE   25 mg Oral BID   insulin  aspart  0-9 Units Subcutaneous TID WC   levothyroxine   175 mcg Oral Q0600   metoprolol  tartrate  37.5 mg Oral BID   pregabalin   100 mg Oral Daily   rosuvastatin   5 mg Oral Daily   sevelamer  carbonate  800 mg Oral TID WC   sodium chloride  flush  3 mL Intravenous Q12H    acetaminophen , metoprolol  tartrate, ondansetron  **OR** ondansetron  (ZOFRAN ) IV, sodium chloride  flush  Assessment/ Plan:  Mr. Shaurya Hariri is a 53 y.o.  male past medical conditions including diabetes, hypertension, CVA, and end stage renal disease. Patient presents to the emergency department with increased shortness of breath. He has been admitted for Acute respiratory failure with hypoxia (HCC) [J96.01]  [facility]/TTS/aVF  End-stage renal disease with hyperkalemia on hemodialysis.  Last treatment received on Saturday.  Noncompliance with scheduled dialysis for the past month.  Severely volume overloaded.  Potassium 6.2, patient received Lokelma  and shifting measures in emergency department.    Patient has underwent daily dialysis during this admission with a total of 11 kg removed.  Discussed with patient the importance of receiving dialysis 3 times weekly and encouraged to follow treatments at discharge.  Patient states he will not make treatment tomorrow due to scheduled event.  Will provide short dialysis treatment today.  Patient cleared to discharge from renal stents after dialysis.  2.  Acute respiratory failure due to fluid overload and pneumonia.  Currently off of BiPAP.  Weaned to room air.  Completed azithromycin  and ceftriaxone  for pneumonia.  Transition to oral Augmentin .  3.  Atrial fibrillation with RVR, heart rate ranges from 90s to 180s. Patient states he inconsistently follows with outpatient cardiology.    Continue amiodarone  and diltiazem .  4. Anemia of chronic kidney disease Lab Results  Component Value Date   HGB 11.3 (L) 12/26/2023    Hemoglobin within optimal range.  No need for ESA's at this time.  5. Secondary Hyperparathyroidism: with outpatient labs: Unavailable  Lab Results  Component Value Date   CALCIUM  7.8 (L) 12/26/2023   PHOS 8.4 (H) 12/25/2023    Bone minerals have improved since admission.  Continue Renvela  800 mg 3 times daily.    LOS: 2 Eidan Muellner 5/2/20251:59 PM

## 2023-12-26 NOTE — Care Management Important Message (Signed)
 Important Message  Patient Details  Name: Gregory Crane MRN: [id number] Date of Birth: 1971/03/05   Important Message Given:  Yes - Medicare IM     Anise Kerns 12/26/2023, 12:07 PM

## 2023-12-26 NOTE — Evaluation (Signed)
 Physical Therapy Evaluation Patient Details Name: Gregory Crane MRN: [id number] DOB: 08-Mar-1971 Today's Date: 12/26/2023  History of Present Illness  Stavon Bazan is a 52yoM who comes to [facility] on 12/23/23 from dialysis 2/2 SOB, sats at 70% on room air, 88% on 6L He was not dialyzed at that time. Pt developed tachycardia in 170-180s when taken down for HD 4/29. Workup revealing for ARF secondary to PNA, volume overload.  Clinical Impression  Pt familiar to author from outpatient services recently. Pt able to demonstrate near-baseline performance, safe AMB >278ft with RW, some steps at bedside without device. Needs no assistance for transfers and bed mobility. No DME needs. Recommend cont outpatient rehab at [place].         If plan is discharge home, recommend the following: Assist for transportation   Can travel by private vehicle        Equipment Recommendations None recommended by PT  Recommendations for Other Services       Functional Status Assessment Patient has had a recent decline in their functional status and demonstrates the ability to make significant improvements in function in a reasonable and predictable amount of time.     Precautions / Restrictions Precautions Precautions: Fall Restrictions Weight Bearing Restrictions Per Provider Order: No      Mobility  Bed Mobility Overal bed mobility: Modified Independent Bed Mobility: Supine to Sit, Sit to Supine     Supine to sit: Modified independent (Device/Increase time), HOB elevated, Used rails     General bed mobility comments: dons shoes at EOB    Transfers Overall transfer level: Modified independent Equipment used: Rolling walker (2 wheels) Transfers: Sit to/from Stand Sit to Stand: Modified independent (Device/Increase time)                Ambulation/Gait Ambulation/Gait assistance: Contact guard assist Gait Distance (Feet): 240 Feet Assistive device: Rolling walker (2 wheels) Gait  Pattern/deviations: Ataxic, WFL(Within Functional Limits), Step-through pattern Gait velocity: 0.52m/s     General Gait Details: slight BLE ataxia as per baseline, no cues needed for safe RW use  Stairs            Wheelchair Mobility     Tilt Bed    Modified Rankin (Stroke Patients Only)       Balance                                             Pertinent Vitals/Pain Pain Assessment Pain Assessment: No/denies pain    Home Living Family/patient expects to be discharged to:: Private residence Living Arrangements: Alone   Type of Home: House Home Access: Level entry     Alternate Level Stairs-Number of Steps: 15 Home Layout: Two level;Bed/bath upstairs Home Equipment: Cane - single point;Rolling Walker (2 wheels)      Prior Function Prior Level of Function : Independent/Modified Independent             Mobility Comments: has been in OPPT and transitined to walking without device, tolerates community distances with buggy ADLs Comments: independent     Extremity/Trunk Transport planner  Cueing       General Comments      Exercises     Assessment/Plan    PT Assessment Patient needs continued PT services  PT Problem List Decreased strength;Decreased activity tolerance;Decreased balance;Decreased mobility;Decreased range of motion;Decreased knowledge of use of DME       PT Treatment Interventions DME instruction;Gait training;Functional mobility training;Therapeutic activities;Stair training;Therapeutic exercise;Balance training;Neuromuscular re-education;Patient/family education    PT Goals (Current goals can be found in the Care Plan section)  Acute Rehab PT Goals Patient Stated Goal: wants to make a memorial service tomorrow PT Goal Formulation: With patient Time For Goal Achievement: 01/09/24 Potential to Achieve  Goals: Good    Frequency Min 3X/week     Co-evaluation               AM-PAC PT "6 Clicks" Mobility  Outcome Measure Help needed turning from your back to your side while in a flat bed without using bedrails?: None Help needed moving from lying on your back to sitting on the side of a flat bed without using bedrails?: None Help needed moving to and from a bed to a chair (including a wheelchair)?: None Help needed standing up from a chair using your arms (e.g., wheelchair or bedside chair)?: None Help needed to walk in hospital room?: A Little Help needed climbing 3-5 steps with a railing? : A Little 6 Click Score: 22    End of Session Equipment Utilized During Treatment: Gait belt Activity Tolerance: Patient tolerated treatment well;No increased pain Patient left: in chair;with call bell/phone within reach;with nursing/sitter in room Nurse Communication: Mobility status PT Visit Diagnosis: Difficulty in walking, not elsewhere classified (R26.2);Unsteadiness on feet (R26.81);Other abnormalities of gait and mobility (R26.89);Muscle weakness (generalized) (M62.81)    Time: 0930-1010 PT Time Calculation (min) (ACUTE ONLY): 40 min   Charges:   PT Evaluation $PT Eval Moderate Complexity: 1 Mod PT Treatments $Therapeutic Activity: 8-22 mins PT General Charges $$ ACUTE PT VISIT: 1 Visit       10:32 AM, 12/26/23 Dawn Eth, PT, DPT Physical Therapist - [name]  [phone] (ASCOM)    Nichalas Coin C 12/26/2023, 10:31 AM

## 2023-12-26 NOTE — Discharge Summary (Signed)
 Physician Discharge Summary   Patient: Gregory Crane MRN: [id number] DOB: 10-23-1970  Admit date:     12/23/2023  Discharge date: 12/26/23  Discharge Physician: Luna Salinas   PCP: Dellar Fenton, MD   Recommendations at discharge:  Please obtain CBC and renal function on follow-up Please ensure completion of antibiotics Please ensure he is compliant with 3 times a week dialysis Follow-up with primary care provider Follow-up at dialysis center and nephrology according to her schedule dialysis Follow-up with cardiology  Discharge Diagnoses: Principal Problem:   Acute respiratory failure with hypoxia Gregory Crane) Active Problems:   Community acquired pneumonia   Atrial fibrillation with rapid ventricular response (Gregory Crane)   SVT (supraventricular tachycardia) (Gregory Crane)   ESRD on hemodialysis (Gregory Crane)   History of CVA (cerebrovascular accident)   Hypertension   Type 2 diabetes mellitus, with long-term current use of insulin  Gregory Crane)   Crane Course: Taken from H&P.  Gregory Crane is a 53 y.o. male with medical history significant of obesity, type 2 Diabetes, ESRD on HD MWF, HTN, hypothyroidism, CVA, bells palsy presenting w/ acute resp failure with hypoxia, volume overload, PNA, afib w/ RVR. Pt reports increased WOB over the past 1 to 2 days.  Patient reports only partial compliance with hemodialysis regimen.  Last dialysis was 5 days ago.  Patient also missed doses of Eliquis .  Does not follow fluid restriction  On presentation tachycardic, was placed on BiPAP, and labs with leukocytosis at 11.2, creatinine 10.4, glucose 226, potassium 6.2, troponin 20 Chest x-ray with bilateral opacities suspicious for multifocal pneumonia. EKG with concern of SVT/atrial tachycardia. Cardiology and nephrology was consulted-patient was taken for emergent dialysis.  Patient was started on amiodarone  by cardiology with resultant improvement in heart rate.  4/30: Still tachypneic and borderline hypoxic on BiPAP.   CT chest yesterday with concern of multifocal pneumonia and small bilateral pleural effusions.  Procalcitonin at 0.23, troponin with a flat curve and barely positive, leukocytosis resolved.  BNP significantly elevated at 1683, more than his baseline.  Nephrology to do another session of dialysis today. Having difficulty weaning him off from [facility] CTA chest to rule out PE as patient was not taking his Eliquis  consistently.  Echocardiogram pending.  5/1: Hemodynamically stable, had another dialysis yesterday evening with UF of 3.5 L.  Respiratory panels negative.  A1c of 6.2.  Admitted on infusion is being switched with p.o.  Going for another session of dialysis today.    Improved oxygen requirement.  5/2: Hemodynamically stable, able to wean back to room air.  Patient had 3 sessions of dialysis during current hospitalization with resultant improvement in volume status.  He was instructed to stay compliance with 3 times a week dialysis to prevent further hospitalization due to volume overload. Patient was also given 2 more days of Augmentin  to complete a 5-day course for concern of pneumonia. Patient was also started on amiodarone , Cardizem  and metoprolol  for concern of SVT and he was instructed to be compliant with Eliquis .  Home amlodipine  was discontinued.  Patient will continue on current medications and need to have a close follow-up with his providers for further assistance.  Assessment and Plan: * Acute respiratory failure with hypoxia (Gregory Crane) Pneumonia  Volume Overload  Decompensated respiratory status requiring BiPAP in the setting of volume overload associated with ESRD and partial dialysis regimen as well as concern for possible multifocal pneumonia.  Procalcitonin at 0.23 Predominantly HD dependent volume status  Much improved oxygen requirement, able to wean from BiPAP after getting 2  back-to-back dialysis sessions. CTA was negative for PE, did show multifocal pneumonia with  small bilateral pleural effusions.  Respiratory panel negative  Preliminary blood cultures negative. -Continue IV Rocephin  azithromycin  for infectious coverage - Being discharged on Augmentin  to complete the course. Patient is now on room air.  Atrial fibrillation with rapid ventricular response Gregory Crane) Patient initially presented with concern of A-fib with RVR, cardiology was consulted and it looks like he was having SVT/AT Started on amiodarone  infusion-currently in sinus rhythm Patient was started on p.o. amiodarone  and he will continue metoprolol  with a close outpatient cardiology follow-up -Continue with Eliquis   ESRD on hemodialysis (Gregory Crane) Baseline ESRD with hemodialysis partial compliance Only going to dialysis on Tuesdays and Thursdays Current presentation with acute volume overload requiring BiPAP HD dependent volume status pending dialysis today Discussed importance of TTS hemodialysis Patient expressed understanding - Had 4 back-to-back dialysis sessions since admission -Patient received dialysis before discharge and he will resume his schedule from Tuesday -Nephrology is on board and will monitor dialysis  History of CVA (cerebrovascular accident) Remote history of CVA w/ chronic L sided weakness.  At baseline. Cont home statin Patient is on Eliquis  instead of aspirin   Hypertension Blood pressure within goal - Continue home hydralazine  -Keep holding amlodipine  for concern of acute CHF-we will resume after echocardiogram  Type 2 diabetes mellitus, with long-term current use of insulin  (Gregory Crane) Hyperglycemia on presentation, he was not on any antidiabetic medications recently.  A1c of 6.4 in February 2025, repeat A1c 6.2 -Continue with SSI  Consultants: Nephrology.  Cardiology Procedures performed: Hemodialysis Disposition: Home Diet recommendation:  Discharge Diet Orders (From admission, onward)     Start     Ordered   12/26/23 0000  Diet - low sodium heart healthy         12/26/23 1039           Renal diet DISCHARGE MEDICATION: Allergies as of 12/26/2023   No Known Allergies      Medication List     STOP taking these medications    amLODipine  10 MG tablet Commonly known as: NORVASC    levETIRAcetam  500 MG tablet Commonly known as: Keppra        TAKE these medications    amiodarone  200 MG tablet Commonly known as: PACERONE  Take 1 tablet (200 mg total) by mouth 2 (two) times daily.   amoxicillin -clavulanate 250-125 MG tablet Commonly known as: AUGMENTIN  Take 1 tablet by mouth every 12 (twelve) hours for 4 doses.   apixaban  5 MG Tabs tablet Commonly known as: ELIQUIS  Take 1 tablet (5 mg total) by mouth 2 (two) times daily.   diltiazem  120 MG 24 hr capsule Commonly known as: CARDIZEM  CD Take 1 capsule (120 mg total) by mouth daily.   dorzolamide-timolol 2-0.5 % ophthalmic solution Commonly known as: COSOPT Place 1 drop into both eyes 2 (two) times daily.   furosemide  40 MG tablet Commonly known as: LASIX  Take 1 tablet (40 mg total) by mouth 2 (two) times daily.   hydrALAZINE  25 MG tablet Commonly known as: APRESOLINE  Take 25 mg by mouth 2 (two) times daily.   HYDROcodone -acetaminophen  5-325 MG tablet Commonly known as: NORCO/VICODIN Take 2 tablets by mouth 2 (two) times daily.   levothyroxine  175 MCG tablet Commonly known as: SYNTHROID  TAKE 1 TABLET (175 MCG) BY MOUTH ONCE DAILY BEFORE BREAKFAST.   Metoprolol  Tartrate 37.5 MG Tabs Take 1 tablet (37.5 mg total) by mouth 2 (two) times daily. What changed:  medication strength how much to take Another  medication with the same name was removed. Continue taking this medication, and follow the directions you see here.   pregabalin  100 MG capsule Commonly known as: LYRICA  Take 1 capsule (100 mg total) by mouth daily.   Renvela  800 MG tablet Generic drug: sevelamer  carbonate Take 800 mg by mouth 3 (three) times daily.   rosuvastatin  5 MG tablet Commonly known as:  CRESTOR  Take 5 mg by mouth daily.        Follow-up Information     Dellar Fenton, MD. Schedule an appointment as soon as possible for a visit in 1 week(s).   Specialty: Internal Medicine Contact information: [address] [place] [address] [phone]         Wendie Hamburg, MD. Schedule an appointment as soon as possible for a visit in 1 week(s).   Specialties: Cardiology, Radiology Contact information: [address] [place] [address] [phone]                Discharge Exam: Gregory Crane Weights   12/23/23 1536 12/24/23 0623 12/25/23 0600  Weight: 122.1 kg 119.3 kg 114.7 kg   General.  Obese gentleman, in no acute distress. Pulmonary.  Lungs clear bilaterally, normal respiratory effort. CV.  Regular rate and rhythm, no JVD, rub or murmur. Abdomen.  Soft, nontender, nondistended, BS positive. CNS.  Alert and oriented .  No focal neurologic deficit. Extremities.  No edema, no cyanosis, pulses intact and symmetrical. Psychiatry.  Judgment and insight appears normal.   Condition at discharge: stable  The results of significant diagnostics from this hospitalization (including imaging, microbiology, ancillary and laboratory) are listed below for reference.   Imaging Studies: ECHOCARDIOGRAM COMPLETE Result Date: 12/25/2023    ECHOCARDIOGRAM REPORT   Patient Name:   [facility] Date of Exam: 12/25/2023 Medical Rec #:  [id number]      Height:       72.0 in Accession #:    [id number]     Weight:       252.9 lb Date of Birth:  17-Sep-1970     BSA:          2.354 m Patient Age:    52 years       BP:           143/82 mmHg Patient Gender: M              HR:           60 bpm. Exam Location:  [facility] Procedure: 2D Echo, Cardiac Doppler and Color Doppler (Both Spectral and Color            Flow Doppler were utilized during procedure). Indications:     Acute respiratory distress R06.03  History:         Patient has prior history of  Echocardiogram examinations, most                  recent 07/02/2023. Stroke; Risk Factors:Diabetes and                  Hypertension.  Sonographer:     Broadus Canes Referring Phys:  [phone] CHRISTOPHER END Diagnosing Phys: Timothy Gollan MD IMPRESSIONS  1. Left ventricular ejection fraction, by estimation, is 60 to 65%. The left ventricle has normal function. The left ventricle has no regional wall motion abnormalities. There is mild left ventricular hypertrophy. Left ventricular diastolic parameters were normal.  2. Right ventricular systolic function is normal. The right ventricular size is normal.  3. The mitral valve is normal in structure. Mild mitral valve regurgitation. No evidence of mitral stenosis.  4. The aortic valve is tricuspid. There is mild calcification of the aortic valve. Aortic valve regurgitation is not visualized. Aortic valve sclerosis is present, with no evidence of aortic valve stenosis.  5. The inferior vena cava is normal in size with greater than 50% respiratory variability, suggesting right atrial pressure of 3 mmHg. FINDINGS  Left Ventricle: Left ventricular ejection fraction, by estimation, is 60 to 65%. The left ventricle has normal function. The left ventricle has no regional wall motion abnormalities. Strain was performed and the global longitudinal strain is indeterminate. The left ventricular internal cavity size was normal in size. There is mild left ventricular hypertrophy. Left ventricular diastolic parameters were normal. Right Ventricle: The right ventricular size is normal. No increase in right ventricular wall thickness. Right ventricular systolic function is normal. Left Atrium: Left atrial size was normal in size. Right Atrium: Right atrial size was normal in size. Pericardium: There is no evidence of pericardial effusion. Mitral Valve: The mitral valve is normal in structure. Mild mitral valve regurgitation. No evidence of mitral valve stenosis. MV peak gradient, 8.0 mmHg.  The mean mitral valve gradient is 3.0 mmHg. Tricuspid Valve: The tricuspid valve is normal in structure. Tricuspid valve regurgitation is not demonstrated. No evidence of tricuspid stenosis. Aortic Valve: The aortic valve is tricuspid. There is mild calcification of the aortic valve. Aortic valve regurgitation is not visualized. Aortic valve sclerosis is present, with no evidence of aortic valve stenosis. Aortic valve mean gradient measures 4.0 mmHg. Aortic valve peak gradient measures 6.9 mmHg. Aortic valve area, by VTI measures 2.51 cm. Pulmonic Valve: The pulmonic valve was normal in structure. Pulmonic valve regurgitation is not visualized. No evidence of pulmonic stenosis. Aorta: The aortic root is normal in size and structure. Venous: The inferior vena cava is normal in size with greater than 50% respiratory variability, suggesting right atrial pressure of 3 mmHg. IAS/Shunts: No atrial level shunt detected by color flow Doppler. Additional Comments: 3D was performed not requiring image post processing on an independent workstation and was indeterminate.  LEFT VENTRICLE PLAX 2D LVIDd:         4.40 cm   Diastology LVIDs:         2.70 cm   LV e' medial:    6.09 cm/s LV PW:         1.20 cm   LV E/e' medial:  21.7 LV IVS:        1.60 cm   LV e' lateral:   6.85 cm/s LVOT diam:     2.20 cm   LV E/e' lateral: 19.3 LV SV:         81 LV SV Index:   34 LVOT Area:     3.80 cm  RIGHT VENTRICLE RV Basal diam:  3.70 cm RV Mid diam:    3.00 cm LEFT ATRIUM           Index        RIGHT ATRIUM           Index LA diam:      3.00 cm 1.27 cm/m   RA Area:     18.60 cm LA Vol (A2C): 54.5 ml 23.15 ml/m  RA Volume:   47.40 ml  20.14 ml/m LA Vol (A4C): 66.9 ml 28.42 ml/m  AORTIC VALVE AV Area (Vmax):    2.59 cm AV Area (Vmean):   2.30 cm AV  Area (VTI):     2.51 cm AV Vmax:           131.00 cm/s AV Vmean:          97.100 cm/s AV VTI:            0.321 m AV Peak Grad:      6.9 mmHg AV Mean Grad:      4.0 mmHg LVOT Vmax:          89.40 cm/s LVOT Vmean:        58.700 cm/s LVOT VTI:          0.212 m LVOT/AV VTI ratio: 0.66  AORTA Ao Root diam: 2.80 cm MITRAL VALVE                TRICUSPID VALVE MV Area (PHT): 4.08 cm     TR Peak grad:   28.3 mmHg MV Area VTI:   2.65 cm     TR Vmax:        266.00 cm/s MV Peak grad:  8.0 mmHg MV Mean grad:  3.0 mmHg     SHUNTS MV Vmax:       1.41 m/s     Systemic VTI:  0.21 m MV Vmean:      73.3 cm/s    Systemic Diam: 2.20 cm MV Decel Time: 186 msec MV E velocity: 132.00 cm/s MV A velocity: 79.70 cm/s MV E/A ratio:  1.66 Belva Boyden MD Electronically signed by Belva Boyden MD Signature Date/Time: 12/25/2023/3:06:16 PM    Final    CT Angio Chest Pulmonary Embolism (PE) W or WO Contrast Result Date: 12/24/2023 EXAM: CTA of the Chest with contrast for PE 12/24/2023 08:23:41 PM TECHNIQUE: CTA of the chest was performed after the administration of intravenous contrast. Multiplanar reformatted images are provided for review. MIP images are provided for review. Automated exposure control, iterative reconstruction, and/or weight based adjustment of the mA/kV was utilized to reduce the radiation dose to as low as reasonably achievable. COMPARISON: CT chest dated 12/23/2023. CLINICAL HISTORY: Pulmonary embolism (PE) suspected, low to intermediate prob, positive D-dimer; Patient is on dialysis. 53 y.o. male with medical history significant of obesity, type 2 Diabetes, ESRD on HD MWF, HTN, hypothyroidism, CVA, bells palsy presenting w/ acute resp failure with hypoxia, volume overload, PNA, afib w/ RVR. Pt reports increased WOB over the past 1 to 2 days. Patient reports only partial compliance with hemodialysis regimen. Last dialysis was 5 days ago. Patient also missed doses of Eliquis . FINDINGS: PULMONARY ARTERIES: Pulmonary arteries are adequately opacified for evaluation. No evidence of pulmonary embolism. Main pulmonary artery is normal in caliber. MEDIASTINUM: Mild cardiomegaly. No evidence of mediastinal  lymphadenopathy. There is no acute abnormality of the thoracic aorta. LYMPH NODES: No evidence of mediastinal, hilar or axillary lymphadenopathy. LUNGS AND PLEURA: Multifocal pneumonia, right upper lobe predominant, unchanged. Small bilateral pleural effusions, right greater than left, unchanged. No evidence of pneumothorax. UPPER ABDOMEN: Prior cholecystectomy. Limited images of the upper abdomen are otherwise unremarkable. SOFT TISSUES AND BONES: Degenerative changes of the visualized thoracolumbar spine. No acute bone or soft tissue abnormality. IMPRESSION: 1. No evidence of pulmonary embolism. 2. Multifocal pneumonia with small bilateral pleural effusions, unchanged. Electronically signed by: Zadie Herter MD 12/24/2023 08:37 PM EDT RP Workstation: [id number]   CT CHEST WO CONTRAST Result Date: 12/23/2023 CLINICAL DATA:  Respiratory illness.  Pneumonia. EXAM: CT CHEST WITHOUT CONTRAST TECHNIQUE: Multidetector CT imaging of the chest was performed following the standard protocol without IV contrast. RADIATION  DOSE REDUCTION: This exam was performed according to the departmental dose-optimization program which includes automated exposure control, adjustment of the mA and/or kV according to patient size and/or use of iterative reconstruction technique. COMPARISON:  Chest radiograph dated 12/23/2023. FINDINGS: Evaluation of this exam is limited in the absence of intravenous contrast. Cardiovascular: Top-normal cardiac size. No pericardial effusion. Three-vessel coronary vascular calcification. Mild atherosclerotic calcification of the thoracic aorta. No aneurysmal dilatation. The central pulmonary arteries are grossly unremarkable. Mediastinum/Nodes: No obvious hilar or mediastinal adenopathy. Evaluation however is limited in the absence of intravenous contrast and due to consolidative changes of the lungs. The esophagus is grossly unremarkable. No mediastinal fluid collection. Lungs/Pleura: Small bilateral  pleural effusions. Large area of bilateral pulmonary consolidation consistent with multifocal pneumonia. Follow-up to resolution recommended. No pneumothorax. The central airways are patent. Upper Abdomen: No acute abnormality. Musculoskeletal: Degenerative changes.  No acute osseous pathology. IMPRESSION: 1. Multifocal pneumonia. Follow-up to resolution recommended. 2. Small bilateral pleural effusions. 3.  Aortic Atherosclerosis (ICD10-I70.0). Electronically Signed   By: Angus Bark M.D.   On: 12/23/2023 13:49   DG Chest Portable 1 View Result Date: 12/23/2023 CLINICAL DATA:  Shortness of breath. EXAM: PORTABLE CHEST - 1 VIEW COMPARISON:  07/02/2023 FINDINGS: Heart size within normal limits. Mild pulmonary vascular congestion. Bilateral airspace opacities with greatest concentration in the right upper lung and left lung base. IMPRESSION: Bilateral airspace opacities suspicious for multifocal pneumonia. Electronically Signed   By: Elester Grim M.D.   On: 12/23/2023 08:32    Microbiology: Results for orders placed or performed during the Crane encounter of 12/23/23  Blood Culture (routine x 2)     Status: None (Preliminary result)   Collection Time: 12/23/23  8:52 AM   Specimen: BLOOD  Result Value Ref Range Status   Specimen Description BLOOD BLOOD RIGHT ARM  Final   Special Requests   Final    BOTTLES DRAWN AEROBIC AND ANAEROBIC Blood Culture adequate volume   Culture   Final    NO GROWTH 3 DAYS Performed at [facility], [address] [place], [place] [address]    Report Status PENDING  Incomplete  Blood Culture (routine x 2)     Status: None (Preliminary result)   Collection Time: 12/23/23 10:25 AM   Specimen: BLOOD  Result Value Ref Range Status   Specimen Description BLOOD RIGHT [facility]  Final   Special Requests   Final    BOTTLES DRAWN AEROBIC AND ANAEROBIC Blood Culture adequate volume   Culture   Final    NO GROWTH 3 DAYS Performed at [facility], [address], [place], [place] [address]    Report Status PENDING  Incomplete  MRSA Next Gen by PCR, Nasal     Status: None   Collection Time: 12/23/23  5:00 PM   Specimen: Nasal Mucosa; Nasal Swab  Result Value Ref Range Status   MRSA by PCR Next Gen NOT DETECTED NOT DETECTED Final    Comment: (NOTE) The GeneXpert MRSA Assay (FDA approved for NASAL specimens only), is one component of a comprehensive MRSA colonization surveillance program. It is not intended to diagnose MRSA infection nor to guide or monitor treatment for MRSA infections. Test performance is not FDA approved in patients less than 10 years old. Performed at [facility], [address] [place], [place] [address]   Respiratory (~20 pathogens) panel by PCR     Status: None   Collection Time: 12/23/23  6:02 PM   Specimen: Nasopharyngeal  Swab; Respiratory  Result Value Ref Range Status   Adenovirus NOT DETECTED NOT DETECTED Final   Coronavirus 229E NOT DETECTED NOT DETECTED Final    Comment: (NOTE) The Coronavirus on the Respiratory Panel, DOES NOT test for the novel  Coronavirus (2019 nCoV)    Coronavirus HKU1 NOT DETECTED NOT DETECTED Final   Coronavirus NL63 NOT DETECTED NOT DETECTED Final   Coronavirus OC43 NOT DETECTED NOT DETECTED Final   Metapneumovirus NOT DETECTED NOT DETECTED Final   Rhinovirus / Enterovirus NOT DETECTED NOT DETECTED Final   Influenza A NOT DETECTED NOT DETECTED Final   Influenza B NOT DETECTED NOT DETECTED Final   Parainfluenza Virus 1 NOT DETECTED NOT DETECTED Final   Parainfluenza Virus 2 NOT DETECTED NOT DETECTED Final   Parainfluenza Virus 3 NOT DETECTED NOT DETECTED Final   Parainfluenza Virus 4 NOT DETECTED NOT DETECTED Final   Respiratory Syncytial Virus NOT DETECTED NOT DETECTED Final   Bordetella pertussis NOT DETECTED NOT DETECTED Final   Bordetella Parapertussis NOT DETECTED NOT DETECTED Final   Chlamydophila pneumoniae NOT DETECTED NOT DETECTED Final   Mycoplasma  pneumoniae NOT DETECTED NOT DETECTED Final    Comment: Performed at [facility], [address] [place], [place] [address]  Resp panel by RT-PCR (RSV, Flu A&B, Covid) Anterior Nasal Swab     Status: None   Collection Time: 12/24/23  6:01 PM   Specimen: Anterior Nasal Swab  Result Value Ref Range Status   SARS Coronavirus 2 by RT PCR NEGATIVE NEGATIVE Final    Comment: (NOTE) SARS-CoV-2 target nucleic acids are NOT DETECTED.  The SARS-CoV-2 RNA is generally detectable in upper respiratory specimens during the acute phase of infection. The lowest concentration of SARS-CoV-2 viral copies this assay can detect is 138 copies/mL. A negative result does not preclude SARS-Cov-2 infection and should not be used as the sole basis for treatment or other patient management decisions. A negative result may occur with  improper specimen collection/handling, submission of specimen other than nasopharyngeal swab, presence of viral mutation(s) within the areas targeted by this assay, and inadequate number of viral copies(<138 copies/mL). A negative result must be combined with clinical observations, patient history, and epidemiological information. The expected result is Negative.  Fact Sheet for Patients:  BloggerCourse.com  Fact Sheet for Healthcare Providers:  SeriousBroker.it  This test is no t yet approved or cleared by the [place]  FDA and  has been authorized for detection and/or diagnosis of SARS-CoV-2 by FDA under an Emergency Use Authorization (EUA). This EUA will remain  in effect (meaning this test can be used) for the duration of the COVID-19 declaration under Section 564(b)(1) of the Act, 21 U.S.C.section 360bbb-3(b)(1), unless the authorization is terminated  or revoked sooner.       Influenza A by PCR NEGATIVE NEGATIVE Final   Influenza B by PCR NEGATIVE NEGATIVE Final    Comment: (NOTE) The Xpert Xpress  SARS-CoV-2/FLU/RSV plus assay is intended as an aid in the diagnosis of influenza from Nasopharyngeal swab specimens and should not be used as a sole basis for treatment. Nasal washings and aspirates are unacceptable for Xpert Xpress SARS-CoV-2/FLU/RSV testing.  Fact Sheet for Patients: BloggerCourse.com  Fact Sheet for Healthcare Providers: SeriousBroker.it  This test is not yet approved or cleared by the [place]  FDA and has been authorized for detection and/or diagnosis of SARS-CoV-2 by FDA under an Emergency Use Authorization (EUA). This EUA will remain in effect (meaning this test can be used) for the  duration of the COVID-19 declaration under Section 564(b)(1) of the Act, 21 U.S.C. section 360bbb-3(b)(1), unless the authorization is terminated or revoked.     Resp Syncytial Virus by PCR NEGATIVE NEGATIVE Final    Comment: (NOTE) Fact Sheet for Patients: BloggerCourse.com  Fact Sheet for Healthcare Providers: SeriousBroker.it  This test is not yet approved or cleared by the [place]  FDA and has been authorized for detection and/or diagnosis of SARS-CoV-2 by FDA under an Emergency Use Authorization (EUA). This EUA will remain in effect (meaning this test can be used) for the duration of the COVID-19 declaration under Section 564(b)(1) of the Act, 21 U.S.C. section 360bbb-3(b)(1), unless the authorization is terminated or revoked.  Performed at [facility], [address] [place], [place] [address]    *Note: Due to a large number of results and/or encounters for the requested time period, some results have not been displayed. A complete set of results can be found in Results Review.    Labs: CBC: Recent Labs  Lab 12/23/23 0814 12/24/23 0630 12/25/23 1501 12/26/23 0846  WBC 11.2* 10.5 6.3 5.3  NEUTROABS 10.5*  --   --   --   HGB 12.4* 10.3*  10.3* 11.3*  HCT 38.0* 29.9* 31.2* 33.1*  MCV 94.8 91.7 94.0 90.9  PLT 194 174 157 168   Basic Metabolic Panel: Recent Labs  Lab 12/23/23 0814 12/24/23 0630 12/25/23 1501 12/26/23 0846  NA 136 138 135 136  K 6.2* 3.6 4.0 3.9  CL 107 99 98 97*  CO2 19* 24 21* 23  GLUCOSE 226* 108* 97 120*  BUN 88* 56* 80* 59*  CREATININE 10.42* 6.52* 8.98* 7.01*  CALCIUM  7.6* 8.1* 7.3* 7.8*  PHOS  --   --  8.4*  --    Liver Function Tests: Recent Labs  Lab 12/23/23 0813 12/24/23 0630 12/25/23 1501  AST 17 16  --   ALT 15 13  --   ALKPHOS 79 60  --   BILITOT 0.8 0.8  --   PROT 8.4* 7.3  --   ALBUMIN 4.4 3.7 3.3*   CBG: Recent Labs  Lab 12/25/23 0750 12/25/23 1130 12/25/23 1810 12/25/23 2113 12/26/23 0742  GLUCAP 97 172* 82 113* 102*    Discharge time spent: greater than 30 minutes.  This record has been created using Conservation officer, historic buildings. Errors have been sought and corrected,but may not always be located. Such creation errors do not reflect on the standard of care.   Signed: Luna Salinas, MD [facility] 12/26/2023

## 2023-12-26 NOTE — Progress Notes (Signed)
 Hemodialysis Note:  Received patient in bed to unit. Alert and oriented. Informed consent singed and in chart.  Treatment initiated: 1410 Treatment completed: 1715  Access used: Left AVF Access issues: None  Patient tolerated well. Transported back to room, alert without acute distress. Report given to patient's RN.  Total UF removed: 2 Liters Medications given: None  Post HD weight: 112.7 Kg  [facility]

## 2023-12-26 NOTE — Progress Notes (Signed)
 Patient will be discharged today home after HD treatment

## 2023-12-28 LAB — CULTURE, BLOOD (ROUTINE X 2)
Culture: NO GROWTH
Culture: NO GROWTH
Special Requests: ADEQUATE
Special Requests: ADEQUATE

## 2023-12-29 ENCOUNTER — Ambulatory Visit: Attending: Internal Medicine

## 2023-12-29 ENCOUNTER — Telehealth: Payer: Self-pay

## 2023-12-29 ENCOUNTER — Ambulatory Visit: Payer: Medicare Other

## 2023-12-29 DIAGNOSIS — R2681 Unsteadiness on feet: Secondary | ICD-10-CM | POA: Insufficient documentation

## 2023-12-29 DIAGNOSIS — R269 Unspecified abnormalities of gait and mobility: Secondary | ICD-10-CM | POA: Insufficient documentation

## 2023-12-29 DIAGNOSIS — R262 Difficulty in walking, not elsewhere classified: Secondary | ICD-10-CM | POA: Insufficient documentation

## 2023-12-29 DIAGNOSIS — R278 Other lack of coordination: Secondary | ICD-10-CM | POA: Diagnosis present

## 2023-12-29 DIAGNOSIS — R2689 Other abnormalities of gait and mobility: Secondary | ICD-10-CM | POA: Insufficient documentation

## 2023-12-29 DIAGNOSIS — M6281 Muscle weakness (generalized): Secondary | ICD-10-CM | POA: Diagnosis present

## 2023-12-29 NOTE — Therapy (Signed)
 OUTPATIENT PHYSICAL THERAPY NEURO EVALUATION   Patient Name: Larrell Frediani MRN: [id number] DOB:12-02-1970, 53 y.o., male Today's Date: 12/29/2023   PCP: Dellar Fenton  REFERRING PROVIDER: Dellar Fenton  END OF SESSION:  PT End of Session - 12/29/23 0953     Visit Number 1    Number of Visits 24    Date for PT Re-Evaluation 03/22/24    PT Start Time 0930    PT Stop Time 1014    PT Time Calculation (min) 44 min    Equipment Utilized During Treatment Gait belt    Activity Tolerance Patient tolerated treatment well    Behavior During Therapy [facility] for tasks assessed/performed             Past Medical History:  Diagnosis Date   Allergy    Anemia    Bell's palsy    Diabetes mellitus without complication (HCC)    diet controlled   Fall 07/2023   Hypertension    Hypothyroidism    Kidney stones    Pseudotumor cerebri    Stroke [facility])    Past Surgical History:  Procedure Laterality Date   COLONOSCOPY WITH PROPOFOL  N/A 03/30/2020   Procedure: COLONOSCOPY WITH PROPOFOL ;  Surgeon: Shane Darling, MD;  Location: [facility] ENDOSCOPY;  Service: Endoscopy;  Laterality: N/A;   LOOP RECORDER INSERTION N/A 01/27/2018   Procedure: LOOP RECORDER INSERTION;  Surgeon: Verona Goodwill, MD;  Location: [facility] INVASIVE CV LAB;  Service: Cardiovascular;  Laterality: N/A;   LUMBAR PUNCTURE     as child   NO PAST SURGERIES     REMOVAL OF A DIALYSIS CATHETER     TEE WITHOUT CARDIOVERSION N/A 01/07/2018   Procedure: TRANSESOPHAGEAL ECHOCARDIOGRAM (TEE);  Surgeon: Devorah Fonder, MD;  Location: [facility] ORS;  Service: Cardiovascular;  Laterality: N/A;   Patient Active Problem List   Diagnosis Date Noted   Acute on chronic heart failure with preserved ejection fraction (HFpEF) (HCC) 12/26/2023   Community acquired pneumonia 12/24/2023   Acute respiratory failure with hypoxia (HCC) 12/23/2023   SVT (supraventricular tachycardia) (HCC) 08/10/2023   Atrial fibrillation with rapid ventricular  response (HCC) 07/03/2023   Atrial fibrillation with RVR (HCC) 07/02/2023   NSTEMI (non-ST elevated myocardial infarction) (HCC) 07/02/2023   Fall 06/15/2023   Non-compliance with renal dialysis (HCC) 05/02/2023   Obesity (BMI 30-39.9) 04/30/2023   Volume overload 04/29/2023   Renal osteodystrophy 11/25/2022   Unsteady gait 10/05/2022   Steal syndrome of dialysis vascular access (HCC) 05/28/2022   Hydronephrosis, left 05/04/2022   Leukocytosis 05/04/2022   Pre-op evaluation 04/24/2022   Chest pain 01/13/2022   Diabetic retinopathy associated with diabetes mellitus due to underlying condition (HCC) 01/13/2022   ESRD on hemodialysis (HCC) 01/13/2022   Deafness in right ear 08/11/2021   Open wound 01/25/2021   History of colon polyps 10/15/2020   Postoperative hemorrhage involving digestive system following digestive system procedure 09/19/2020   Acute cholecystitis without calculus 09/09/2020   Type 2 diabetes mellitus, with long-term current use of insulin  (HCC) 09/09/2020   Cryptogenic stroke (HCC) 07/13/2020   History of loop recorder 07/13/2020   History of 2019 novel coronavirus disease (COVID-19) 05/20/2020   Pneumonia due to COVID-19 virus 04/03/2020   Elevated troponin 04/03/2020   Acquired trigger finger 06/08/2019   Lymphedema 06/08/2019   Anemia 04/17/2019   Swelling of both lower extremities 01/10/2019   Facial droop 04/09/2018   Daytime somnolence 03/30/2018   Carotid artery disease (HCC) 02/03/2018   Intracranial vascular stenosis  09/12/2017   Cough 01/20/2017   Bell's palsy 11/10/2016   History of CVA (cerebrovascular accident) 11/10/2016   Benign localized hyperplasia of prostate with urinary obstruction 10/27/2016   History of nephrolithiasis 10/27/2016   TIA (transient ischemic attack) 10/20/2016   Near syncope 06/23/2016   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   Neuropathy 08/06/2015   Health care maintenance 08/06/2015   Hypertension 08/06/2015   Heme positive  stool 10/10/2013   Hypothyroidism 10/10/2013   Microalbuminuria 10/10/2013   Hyperlipidemia 10/10/2013   B12 deficiency 10/10/2013   Environmental allergies 07/04/2013    ONSET DATE: 3 years ago  REFERRING DIAG: unsteady gait  THERAPY DIAG:  Muscle weakness (generalized)  Unsteadiness on feet  Difficulty in walking, not elsewhere classified  Rationale for Evaluation and Treatment: Rehabilitation  SUBJECTIVE:                                                                                                                                                                                             SUBJECTIVE STATEMENT: Patient referral to PT for unsteady gait. Patient did not wear his AFO today.  Pt accompanied by: self  PERTINENT HISTORY: Patient returning to PT s/p hospitalization for pneumonia, , SVT, ESRD on hypodialysis. PMH DM, HTN, Anemia, Bells palsy, Hypothyroidism, Kidney stones, stroke and pseudotumour cerebri. Macular degeneration with impaired visual acuity   PAIN:  Are you having pain?  Some L foot numbness/neuropathy  PRECAUTIONS: Fall  RED FLAGS: None   WEIGHT BEARING RESTRICTIONS: No  FALLS: Has patient fallen in last 6 months? No  LIVING ENVIRONMENT: Lives with: lives alone Lives in: House/apartment Stairs:  yes stairs in house Has following equipment at home: Single point cane  PLOF: Independent with basic ADLs  PATIENT GOALS: balance, strength  OBJECTIVE:  Note: Objective measures were completed at Evaluation unless otherwise noted.  DIAGNOSTIC FINDINGS: IMPRESSION: Interval resolution of previously noted trace left parafalcine subdural hematoma. No acute intracranial process.  COGNITION: Overall cognitive status: Within functional limits for tasks assessed   SENSATION: WFL  COORDINATION: Heel slide: WFL RLE; slight decrease with LLE   MUSCLE TONE: WFL  POSTURE: rounded shoulders, forward head, and posterior pelvic tilt  LOWER  EXTREMITY ROM:     Active  Right Eval Left Eval  Hip flexion    Hip extension    Hip abduction    Hip adduction    Hip internal rotation    Hip external rotation    Knee flexion    Knee extension    Ankle dorsiflexion  -3  Ankle plantarflexion  35  Ankle inversion  10  Ankle eversion  8   (  Blank rows = not tested)  LOWER EXTREMITY MMT:    MMT Right Eval Left Eval  Hip flexion 4 4  Hip extension    Hip abduction 3 3  Hip adduction 3+ 3+  Hip internal rotation    Hip external rotation    Knee flexion 4- 4-  Knee extension 4 4  Ankle dorsiflexion 3 2+  Ankle plantarflexion 3+ 3  Ankle inversion    Ankle eversion    (Blank rows = not tested)  BED MOBILITY:  Not tested  TRANSFERS: Sit to stand: CGA  Assistive device utilized: None     Stand to sit: CGA  Assistive device utilized: None     Chair to chair: CGA  Assistive device utilized: None       RAMP:  Not tested  CURB:  Findings: needs UE support to step up  STAIRS: Test next session  GAIT: Findings: Gait Characteristics: decreased stride length, Left foot flat, wide BOS, and poor foot clearance- Left, Distance walked: 60 ft, Assistive device utilized:None, Level of assistance: CGA, and Comments: increased L ankle rolling due to not wearing AFO  FUNCTIONAL TESTS:  5 times sit to stand: 30.53 with 4 posterior LOB 6 minute walk test: perform next session  Berg Balance Scale: 26  PATIENT SURVEYS:  LEFS give next session                                                                                                                               TREATMENT DATE: 12/29/23  Eval +  Home Program / Education:   Access Code: [email or web address] URL: [email or web address] Date: 12/29/2023 Prepared by: Reesha Debes  Exercises - Leg Extension  - 1 x daily - 7 x weekly - 2 sets - 10 reps - 5 hold - Seated March  - 1 x daily - 7 x weekly - 2 sets - 10 reps - 5 hold - Seated Heel Raise  - 1 x daily - 7 x  weekly - 2 sets - 10 reps - 5 hold   PATIENT EDUCATION: Education details: goals, POC, HEP Person educated: Patient Education method: Explanation, Demonstration, Tactile cues, Verbal cues, and Handouts Education comprehension: verbalized understanding, returned demonstration, verbal cues required, tactile cues required, and needs further education  HOME EXERCISE PROGRAM: Access Code: [email or web address] URL: [email or web address] Date: 12/29/2023 Prepared by: Yoel Kaufhold  Exercises - Leg Extension  - 1 x daily - 7 x weekly - 2 sets - 10 reps - 5 hold - Seated March  - 1 x daily - 7 x weekly - 2 sets - 10 reps - 5 hold - Seated Heel Raise  - 1 x daily - 7 x weekly - 2 sets - 10 reps - 5 hold  GOALS: Goals reviewed with patient? Yes  SHORT TERM GOALS: Target date: 01/26/2024    Patient will be independent in home exercise program to improve strength/mobility for better functional independence with ADLs.  Baseline: Goal  status: INITIAL  LONG TERM GOALS: Target date: 03/22/2024    Patient (< 27 years old) will complete five times sit to stand test in < 10 seconds indicating an increased LE strength and improved balance.  Baseline: 30.53 with 4 posterior LOB Goal status: INITIAL  2.   Patient will increase Berg Balance score by >45/56 to demonstrate decreased fall risk during functional activities.  Baseline: 5/5: 26  Goal status: INITIAL  3.    Patient will increase six minute walk test distance to >1200 for progression to age norm community ambulator and improve gait ability  Baseline: perform next session  Goal status: INITIAL  4.  Patient will score > 60/80 on LEFS for improved functional mobility and quality of life.  Baseline: perform next session  Goal status: INITIAL    ASSESSMENT:  CLINICAL IMPRESSION: Patient is a 53 y.o. male who was seen today for physical therapy evaluation and treatment for difficulty walking. Patient is returning from hospitalization  with significant changes in strength and stability. He is weaker with increased instability as can be seen by BERG and 5xSTS. He has frequent posterior LOB in standing and transitioning requiring close CGA. Patient is educated on HEP for safe strengthening tasks at home and demonstrates understanding.  Patient will benefit from skilled physical therapy to improve strength, mobility, and stability.   OBJECTIVE IMPAIRMENTS: Abnormal gait, cardiopulmonary status limiting activity, decreased activity tolerance, decreased balance, decreased coordination, decreased endurance, decreased knowledge of use of DME, decreased mobility, difficulty walking, decreased ROM, decreased strength, dizziness, impaired perceived functional ability, impaired flexibility, impaired vision/preception, improper body mechanics, postural dysfunction, and obesity.   ACTIVITY LIMITATIONS: carrying, lifting, bending, sitting, standing, squatting, stairs, transfers, bed mobility, toileting, dressing, reach over head, hygiene/grooming, locomotion level, and caring for others  PARTICIPATION LIMITATIONS: meal prep, cleaning, laundry, driving, shopping, community activity, occupation, and yard work  PERSONAL FACTORS: Age, Behavior pattern, Fitness, Past/current experiences, Time since onset of injury/illness/exacerbation, Transportation, and 3+ comorbidities: DM, HTN, Anemia, Bells palsy, Hypothyroidism, Kidney stones, stroke and pseudotumour cerebri. Macular degeneration with impaired visual acuity   are also affecting patient's functional outcome.   REHAB POTENTIAL: Good  CLINICAL DECISION MAKING: Evolving/moderate complexity  EVALUATION COMPLEXITY: Moderate  PLAN:  PT FREQUENCY: 2x/week  PT DURATION: 12 weeks  PLANNED INTERVENTIONS: 97164- PT Re-evaluation, 97750- Physical Performance Testing, 97110-Therapeutic exercises, 97530- Therapeutic activity, [id number]- Neuromuscular re-education, 97535- Self Care, [address]- Manual therapy,  [id number]- Gait training, [id number]- Orthotic Initial, [id number]- Orthotic/Prosthetic subsequent, [phone]- Canalith repositioning, [id number]- Splinting, U9811- Electrical stimulation (unattended), [phone]- Electrical stimulation (manual), [id number]- Vasopneumatic device, [id number]- Ultrasound, [id number]- Traction (mechanical), [id number]- Ionotophoresis 4mg /ml Dexamethasone , Patient/Family education, Balance training, Stair training, Taping, Dry Needling, Joint mobilization, Spinal mobilization, Scar mobilization, Compression bandaging, Vestibular training, Visual/preceptual remediation/compensation, Cognitive remediation, DME instructions, Cryotherapy, Moist heat, and Biofeedback  PLAN FOR NEXT SESSION: test stairs and 6 MWT, LEFS    Tavyn Kurka, PT 12/29/2023, 2:31 PM

## 2023-12-29 NOTE — Transitions of Care (Post Inpatient/ED Visit) (Signed)
   12/29/2023  Name: Tayvon Yousuf MRN: [id number] DOB: Jul 31, 1971  Today's TOC FU Call Status: Today's TOC FU Call Status:: Unsuccessful Call (1st Attempt) Unsuccessful Call (1st Attempt) Date: 12/29/23  Attempted to reach the patient regarding the most recent Inpatient/ED visit.  Follow Up Plan: Additional outreach attempts will be made to reach the patient to complete the Transitions of Care (Post Inpatient/ED visit) call.   Gareld June, BSN, RN [place]  VBCI - [employer] [facility] RN Care Manager [phone]

## 2023-12-30 ENCOUNTER — Telehealth: Payer: Self-pay | Admitting: Internal Medicine

## 2023-12-30 ENCOUNTER — Telehealth: Payer: Self-pay

## 2023-12-30 NOTE — Transitions of Care (Post Inpatient/ED Visit) (Signed)
   12/30/2023  Name: Gregory Crane MRN: [id number] DOB: 1971-07-02  Today's TOC FU Call Status: Today's TOC FU Call Status:: Unsuccessful Call (2nd Attempt) Unsuccessful Call (1st Attempt) Date: 12/29/23 Unsuccessful Call (2nd Attempt) Date: 12/30/23  Attempted to reach the patient regarding the most recent Inpatient/ED visit.  Follow Up Plan: Additional outreach attempts will be made to reach the patient to complete the Transitions of Care (Post Inpatient/ED visit) call.   Gareld June, BSN, RN [name]  VBCI - [employer] [facility] RN Care Manager [phone]

## 2023-12-30 NOTE — Telephone Encounter (Signed)
Left message on voice mail to schedule

## 2023-12-30 NOTE — Telephone Encounter (Signed)
-----   Message from [place] sent at 12/26/2023 11:24 AM EDT ----- Regarding: Hospital f/u Good morning,  Mr. Hornbostel will likely be d/c'ed today.  Can you arrange for him to f/u with Dr. Alda Amas or an APP (would be fine for Suzann to see him since his issues are largely EP-related)?  Thanks.  Larinda Plover

## 2023-12-30 NOTE — Telephone Encounter (Signed)
-----   Message from [place] sent at 12/26/2023 11:24 AM EDT ----- Regarding: Hospital f/u Good morning,  Mr. Schettler will likely be d/c'ed today.  Can you arrange for him to f/u with Dr. Alda Amas or an APP (would be fine for Suzann to see him since his issues are largely EP-related)?  Thanks.  Larinda Plover

## 2023-12-31 ENCOUNTER — Telehealth: Payer: Self-pay

## 2023-12-31 ENCOUNTER — Encounter: Payer: Self-pay | Admitting: Internal Medicine

## 2023-12-31 ENCOUNTER — Ambulatory Visit: Payer: Medicare Other

## 2023-12-31 ENCOUNTER — Ambulatory Visit: Admitting: Internal Medicine

## 2023-12-31 VITALS — BP 118/70 | HR 65 | Temp 98.0°F | Resp 16 | Ht 72.0 in | Wt 258.4 lb

## 2023-12-31 DIAGNOSIS — D649 Anemia, unspecified: Secondary | ICD-10-CM

## 2023-12-31 DIAGNOSIS — N186 End stage renal disease: Secondary | ICD-10-CM

## 2023-12-31 DIAGNOSIS — I779 Disorder of arteries and arterioles, unspecified: Secondary | ICD-10-CM

## 2023-12-31 DIAGNOSIS — Z794 Long term (current) use of insulin: Secondary | ICD-10-CM

## 2023-12-31 DIAGNOSIS — E782 Mixed hyperlipidemia: Secondary | ICD-10-CM | POA: Diagnosis not present

## 2023-12-31 DIAGNOSIS — I10 Essential (primary) hypertension: Secondary | ICD-10-CM | POA: Diagnosis not present

## 2023-12-31 DIAGNOSIS — E039 Hypothyroidism, unspecified: Secondary | ICD-10-CM

## 2023-12-31 DIAGNOSIS — Z8673 Personal history of transient ischemic attack (TIA), and cerebral infarction without residual deficits: Secondary | ICD-10-CM

## 2023-12-31 DIAGNOSIS — G629 Polyneuropathy, unspecified: Secondary | ICD-10-CM

## 2023-12-31 DIAGNOSIS — Z91158 Patient's noncompliance with renal dialysis for other reason: Secondary | ICD-10-CM

## 2023-12-31 DIAGNOSIS — E1122 Type 2 diabetes mellitus with diabetic chronic kidney disease: Secondary | ICD-10-CM

## 2023-12-31 DIAGNOSIS — R2681 Unsteadiness on feet: Secondary | ICD-10-CM

## 2023-12-31 DIAGNOSIS — Z992 Dependence on renal dialysis: Secondary | ICD-10-CM

## 2023-12-31 NOTE — Assessment & Plan Note (Signed)
 Noted to have decreased hgb in hospital. Called dialysis center and they drew cbc yesterday.  Will fax labs.

## 2023-12-31 NOTE — Transitions of Care (Post Inpatient/ED Visit) (Signed)
   12/31/2023  Name: Gregory Crane MRN: [id number] DOB: 1971-08-01  Today's TOC FU Call Status: Today's TOC FU Call Status:: Unsuccessful Call (3rd Attempt) Unsuccessful Call (1st Attempt) Date: 12/29/23 Unsuccessful Call (2nd Attempt) Date: 12/30/23 Unsuccessful Call (3rd Attempt) Date: 12/31/23  Attempted to reach the patient regarding the most recent Inpatient/ED visit.  Follow Up Plan: No further outreach attempts will be made at this time. We have been unable to contact the patient.  Gareld June, BSN, RN [place]  VBCI - [employer] [facility] RN Care Manager [phone]

## 2023-12-31 NOTE — Assessment & Plan Note (Addendum)
 Continue hemodialysis.  Followed by nephrology. Request lab results today from [facility]. Drawn yesterday. Discussed dialysis schedule and need for regular dialysis.

## 2023-12-31 NOTE — Progress Notes (Signed)
 Subjective:    Patient ID: Gregory Crane, male    DOB: January 24, 1971, 53 y.o.   MRN: [id number]  Patient here for  Chief Complaint  Patient presents with   Hospitalization Follow-up    HPI Here for hospital follow up - hospitalized 12/23/23 - 12/26/23 - after presenting with acute respiratory failure with hypoxia, volume overload, pneumonia, afib with RVR. Had been only partially compliant with dialysis prior to admission. Sates he was going to dialysis 2x/week. Does not follow fluid restriction. Was placed on bipap initially. Creatinine 10.4, potassium 6.2 and troponin 20 - on presentation. CXR with bilateral opacities suspicious for multifocal pneumonia. Cardiology consulted and started him on amiodarone . Recommended to continue metoprolol  and eliquis . Received emergent dialysis. Able to wean bipap after getting two back to back dialysis sessions. CTA - negative for PE. Respiratory panel negative. Did show multifocal pneumonia with small bilateral pleural effusions. Discharged on augmentin . Completed. No cough or congestion. Breathing back to baseline. No abdominal pain. Some loose stool this am, but this has not been an issue for him. Working with PT - working on balance and gait. No dizziness or light headedness. No syncope or near syncope. No falls. Able to do his ADLs at home.    Past Medical History:  Diagnosis Date   Allergy    Anemia    Bell's palsy    Diabetes mellitus without complication (HCC)    diet controlled   Fall 07/2023   Hypertension    Hypothyroidism    Kidney stones    Pseudotumor cerebri    Stroke [facility])    Past Surgical History:  Procedure Laterality Date   COLONOSCOPY WITH PROPOFOL  N/A 03/30/2020   Procedure: COLONOSCOPY WITH PROPOFOL ;  Surgeon: Shane Darling, MD;  Location: [facility] ENDOSCOPY;  Service: Endoscopy;  Laterality: N/A;   LOOP RECORDER INSERTION N/A 01/27/2018   Procedure: LOOP RECORDER INSERTION;  Surgeon: Verona Goodwill, MD;  Location: [facility]  INVASIVE CV LAB;  Service: Cardiovascular;  Laterality: N/A;   LUMBAR PUNCTURE     as child   NO PAST SURGERIES     REMOVAL OF A DIALYSIS CATHETER     TEE WITHOUT CARDIOVERSION N/A 01/07/2018   Procedure: TRANSESOPHAGEAL ECHOCARDIOGRAM (TEE);  Surgeon: Devorah Fonder, MD;  Location: [facility] ORS;  Service: Cardiovascular;  Laterality: N/A;   Family History  Problem Relation Age of Onset   Breast cancer Mother    Diabetes Father    Diabetes Sister    Arthritis Maternal Grandmother    Diabetes Maternal Grandmother    Social History   Socioeconomic History   Marital status: Widowed    Spouse name: Cathleen Coach   Number of children: 0   Years of education: Not on file   Highest education level: Not on file  Occupational History   Occupation: disability  Tobacco Use   Smoking status: Never   Smokeless tobacco: Never  Vaping Use   Vaping status: Never Used  Substance and Sexual Activity   Alcohol use: No    Comment: last use early 2023   Drug use: No   Sexual activity: Yes  Other Topics Concern   Not on file  Social History Narrative   Wife deceased Apr 29, 2020, has 2 step children, independent at baseline.   Social Drivers of Health   Financial Resource Strain: Medium Risk (09/12/2023)   Received from [facility]   Overall Financial Resource Strain (CARDIA)    Difficulty of Paying Living Expenses: Somewhat hard  Food Insecurity:  No Food Insecurity (12/24/2023)   Hunger Vital Sign    Worried About Running Out of Food in the Last Year: Never true    Ran Out of Food in the Last Year: Never true  Transportation Needs: No Transportation Needs (12/24/2023)   PRAPARE - Administrator, Civil Service (Medical): No    Lack of Transportation (Non-Medical): No  Physical Activity: Insufficiently Active (06/25/2023)   Exercise Vital Sign    Days of Exercise per Week: 2 days    Minutes of Exercise per Session: 40 min  Stress: No Stress Concern Present (07/09/2023)   Received  from [facility] - Occupational Stress Questionnaire    Feeling of Stress : Not at all  Social Connections: Moderately Isolated (06/25/2023)   Social Connection and Isolation Panel [NHANES]    Frequency of Communication with Friends and Family: More than three times a week    Frequency of Social Gatherings with Friends and Family: Twice a week    Attends Religious Services: More than 4 times per year    Active Member of [employer] or Organizations: No    Attends Banker Meetings: Never    Marital Status: Widowed     Review of Systems  Constitutional:  Negative for appetite change and fever.  HENT:  Negative for congestion and sinus pressure.   Respiratory:  Negative for cough and chest tightness.        Breathing - baseline. Denies increased sob.   Cardiovascular:  Negative for chest pain and palpitations.       Lower extremity swelling - left > right - unchanged.   Gastrointestinal:  Negative for abdominal pain, nausea and vomiting.  Musculoskeletal:  Negative for joint swelling and myalgias.  Skin:  Negative for rash.  Neurological:  Negative for dizziness and headaches.  Psychiatric/Behavioral:  Negative for agitation, dysphoric mood and suicidal ideas.        Objective:     BP 118/70   Pulse 65   Temp 98 F (36.7 C)   Resp 16   Ht 6' (1.829 m)   Wt 258 lb 6.4 oz (117.2 kg)   SpO2 98%   BMI 35.05 kg/m  Wt Readings from Last 3 Encounters:  12/31/23 258 lb 6.4 oz (117.2 kg)  12/26/23 248 lb 7.3 oz (112.7 kg)  10/13/23 244 lb 12.8 oz (111 kg)    Physical Exam Vitals reviewed.  Constitutional:      General: He is not in acute distress.    Appearance: Normal appearance. He is well-developed.  HENT:     Head: Normocephalic and atraumatic.     Right Ear: External ear normal.     Left Ear: External ear normal.     Mouth/Throat:     Pharynx: No oropharyngeal exudate or posterior oropharyngeal erythema.  Eyes:      General: No scleral icterus.       Right eye: No discharge.        Left eye: No discharge.     Conjunctiva/sclera: Conjunctivae normal.  Cardiovascular:     Rate and Rhythm: Normal rate and regular rhythm.  Pulmonary:     Effort: Pulmonary effort is normal. No respiratory distress.     Breath sounds: Normal breath sounds.  Abdominal:     General: Bowel sounds are normal.     Palpations: Abdomen is soft.     Tenderness: There is no abdominal tenderness.  Musculoskeletal:  General: No tenderness.     Cervical back: Neck supple. No tenderness.     Comments: Brace in place - left foot/leg. Pedal and lower extremity swelling - left > right. No increased erythema.   Lymphadenopathy:     Cervical: No cervical adenopathy.  Skin:    Findings: No erythema or rash.  Neurological:     Mental Status: He is alert.  Psychiatric:        Mood and Affect: Mood normal.        Behavior: Behavior normal.         Outpatient Encounter Medications as of 12/31/2023  Medication Sig   amiodarone  (PACERONE ) 200 MG tablet Take 1 tablet (200 mg total) by mouth 2 (two) times daily.   apixaban  (ELIQUIS ) 5 MG TABS tablet Take 1 tablet (5 mg total) by mouth 2 (two) times daily.   diltiazem  (CARDIZEM  CD) 120 MG 24 hr capsule Take 1 capsule (120 mg total) by mouth daily.   dorzolamide-timolol (COSOPT) 2-0.5 % ophthalmic solution Place 1 drop into both eyes 2 (two) times daily.   furosemide  (LASIX ) 40 MG tablet Take 1 tablet (40 mg total) by mouth 2 (two) times daily.   hydrALAZINE  (APRESOLINE ) 25 MG tablet Take 25 mg by mouth 2 (two) times daily.   levothyroxine  (SYNTHROID ) 175 MCG tablet TAKE 1 TABLET (175 MCG) BY MOUTH ONCE DAILY BEFORE BREAKFAST.   metoprolol  tartrate 37.5 MG TABS Take 1 tablet (37.5 mg total) by mouth 2 (two) times daily.   pregabalin  (LYRICA ) 100 MG capsule Take 1 capsule (100 mg total) by mouth daily.   RENVELA  800 MG tablet Take 800 mg by mouth 3 (three) times daily.    rosuvastatin  (CRESTOR ) 5 MG tablet Take 5 mg by mouth daily. (Patient not taking: Reported on 12/23/2023)   [DISCONTINUED] HYDROcodone -acetaminophen  (NORCO/VICODIN) 5-325 MG tablet Take 2 tablets by mouth 2 (two) times daily.   No facility-administered encounter medications on file as of 12/31/2023.     Lab Results  Component Value Date   WBC 5.3 12/26/2023   HGB 11.3 (L) 12/26/2023   HCT 33.1 (L) 12/26/2023   PLT 168 12/26/2023   GLUCOSE 120 (H) 12/26/2023   CHOL 129 10/09/2023   TRIG 146.0 10/09/2023   HDL 34.10 (L) 10/09/2023   LDLDIRECT 107.0 06/26/2021   LDLCALC 66 10/09/2023   ALT 13 12/24/2023   AST 16 12/24/2023   NA 136 12/26/2023   K 3.9 12/26/2023   CL 97 (L) 12/26/2023   CREATININE 7.01 (H) 12/26/2023   BUN 59 (H) 12/26/2023   CO2 23 12/26/2023   TSH 11.24 (H) 10/09/2023   PSA 0.48 10/04/2022   INR 1.3 (H) 08/05/2023   HGBA1C 6.2 (H) 12/24/2023    ECHOCARDIOGRAM COMPLETE Result Date: 12/25/2023    ECHOCARDIOGRAM REPORT   Patient Name:   [facility] Date of Exam: 12/25/2023 Medical Rec #:  [id number]      Height:       72.0 in Accession #:    [id number]     Weight:       252.9 lb Date of Birth:  10/17/1970     BSA:          2.354 m Patient Age:    52 years       BP:           143/82 mmHg Patient Gender: M              HR:  60 bpm. Exam Location:  [facility] Procedure: 2D Echo, Cardiac Doppler and Color Doppler (Both Spectral and Color            Flow Doppler were utilized during procedure). Indications:     Acute respiratory distress R06.03  History:         Patient has prior history of Echocardiogram examinations, most                  recent 07/02/2023. Stroke; Risk Factors:Diabetes and                  Hypertension.  Sonographer:     Broadus Canes Referring Phys:  [phone] CHRISTOPHER END Diagnosing Phys: Timothy Gollan MD IMPRESSIONS  1. Left ventricular ejection fraction, by estimation, is 60 to 65%. The left ventricle has normal function. The left ventricle has no regional  wall motion abnormalities. There is mild left ventricular hypertrophy. Left ventricular diastolic parameters were normal.  2. Right ventricular systolic function is normal. The right ventricular size is normal.  3. The mitral valve is normal in structure. Mild mitral valve regurgitation. No evidence of mitral stenosis.  4. The aortic valve is tricuspid. There is mild calcification of the aortic valve. Aortic valve regurgitation is not visualized. Aortic valve sclerosis is present, with no evidence of aortic valve stenosis.  5. The inferior vena cava is normal in size with greater than 50% respiratory variability, suggesting right atrial pressure of 3 mmHg. FINDINGS  Left Ventricle: Left ventricular ejection fraction, by estimation, is 60 to 65%. The left ventricle has normal function. The left ventricle has no regional wall motion abnormalities. Strain was performed and the global longitudinal strain is indeterminate. The left ventricular internal cavity size was normal in size. There is mild left ventricular hypertrophy. Left ventricular diastolic parameters were normal. Right Ventricle: The right ventricular size is normal. No increase in right ventricular wall thickness. Right ventricular systolic function is normal. Left Atrium: Left atrial size was normal in size. Right Atrium: Right atrial size was normal in size. Pericardium: There is no evidence of pericardial effusion. Mitral Valve: The mitral valve is normal in structure. Mild mitral valve regurgitation. No evidence of mitral valve stenosis. MV peak gradient, 8.0 mmHg. The mean mitral valve gradient is 3.0 mmHg. Tricuspid Valve: The tricuspid valve is normal in structure. Tricuspid valve regurgitation is not demonstrated. No evidence of tricuspid stenosis. Aortic Valve: The aortic valve is tricuspid. There is mild calcification of the aortic valve. Aortic valve regurgitation is not visualized. Aortic valve sclerosis is present, with no evidence of aortic  valve stenosis. Aortic valve mean gradient measures 4.0 mmHg. Aortic valve peak gradient measures 6.9 mmHg. Aortic valve area, by VTI measures 2.51 cm. Pulmonic Valve: The pulmonic valve was normal in structure. Pulmonic valve regurgitation is not visualized. No evidence of pulmonic stenosis. Aorta: The aortic root is normal in size and structure. Venous: The inferior vena cava is normal in size with greater than 50% respiratory variability, suggesting right atrial pressure of 3 mmHg. IAS/Shunts: No atrial level shunt detected by color flow Doppler. Additional Comments: 3D was performed not requiring image post processing on an independent workstation and was indeterminate.  LEFT VENTRICLE PLAX 2D LVIDd:         4.40 cm   Diastology LVIDs:         2.70 cm   LV e' medial:    6.09 cm/s LV PW:         1.20 cm  LV E/e' medial:  21.7 LV IVS:        1.60 cm   LV e' lateral:   6.85 cm/s LVOT diam:     2.20 cm   LV E/e' lateral: 19.3 LV SV:         81 LV SV Index:   34 LVOT Area:     3.80 cm  RIGHT VENTRICLE RV Basal diam:  3.70 cm RV Mid diam:    3.00 cm LEFT ATRIUM           Index        RIGHT ATRIUM           Index LA diam:      3.00 cm 1.27 cm/m   RA Area:     18.60 cm LA Vol (A2C): 54.5 ml 23.15 ml/m  RA Volume:   47.40 ml  20.14 ml/m LA Vol (A4C): 66.9 ml 28.42 ml/m  AORTIC VALVE AV Area (Vmax):    2.59 cm AV Area (Vmean):   2.30 cm AV Area (VTI):     2.51 cm AV Vmax:           131.00 cm/s AV Vmean:          97.100 cm/s AV VTI:            0.321 m AV Peak Grad:      6.9 mmHg AV Mean Grad:      4.0 mmHg LVOT Vmax:         89.40 cm/s LVOT Vmean:        58.700 cm/s LVOT VTI:          0.212 m LVOT/AV VTI ratio: 0.66  AORTA Ao Root diam: 2.80 cm MITRAL VALVE                TRICUSPID VALVE MV Area (PHT): 4.08 cm     TR Peak grad:   28.3 mmHg MV Area VTI:   2.65 cm     TR Vmax:        266.00 cm/s MV Peak grad:  8.0 mmHg MV Mean grad:  3.0 mmHg     SHUNTS MV Vmax:       1.41 m/s     Systemic VTI:  0.21 m MV Vmean:       73.3 cm/s    Systemic Diam: 2.20 cm MV Decel Time: 186 msec MV E velocity: 132.00 cm/s MV A velocity: 79.70 cm/s MV E/A ratio:  1.66 Belva Boyden MD Electronically signed by Belva Boyden MD Signature Date/Time: 12/25/2023/3:06:16 PM    Final        Assessment & Plan:  Anemia, unspecified type Assessment & Plan: Noted to have decreased hgb in hospital. Called dialysis center and they drew cbc yesterday.  Will fax labs.    ESRD on hemodialysis [facility]) Assessment & Plan: Continue hemodialysis.  Followed by nephrology. Request lab results today from [facility]. Drawn yesterday. Discussed dialysis schedule and need for regular dialysis.    Primary hypertension Assessment & Plan: Blood pressure as outlined.  Continue hydralazine , cardizem  and metoprolol . Follow pressures.  Follow metabolic panel. Just had labs drawn at dialysis. Obtain results.    Mixed hyperlipidemia Assessment & Plan: Low cholesterol diet and exercise. Follow lipid panel.    Acquired hypothyroidism Assessment & Plan: On thyroid  replacement. Needs tsh checked. Just had labs through [facility]. Will see if they can draw tsh with next labs. He wanted to hold having drawn today.    Bilateral carotid artery  disease, unspecified type [facility]) Assessment & Plan: Last carotid ultrasound - <50% bilaterally.  Needs to take statin and keep blood pressure under control. Blood pressure as outlined.    Unsteady gait Assessment & Plan: Working with PT. Continue.    Type 2 diabetes mellitus with chronic kidney disease on chronic dialysis, with long-term current use of insulin  (HCC) Assessment & Plan: Low carb diet and exercise.  No change in medication.  Seeing endocrinology.  Denies low sugars. Follow met b and A1c.    Non-compliance with renal dialysis [facility]) Assessment & Plan: Discussed his dialysis schedule today.  Needs to keep regular schedule to avoid volume overload.    Neuropathy Assessment & Plan: Continue lyrica .     History of CVA (cerebrovascular accident) Assessment & Plan: History of CVA with previous left side weakness. Per review, on eliquis . Continue blood pressure control. Continue eliquis .       Dellar Fenton, MD

## 2024-01-01 NOTE — Therapy (Signed)
 OUTPATIENT PHYSICAL THERAPY NEURO TREATMENT   Patient Name: Gregory Crane MRN: [id number] DOB:1971-06-07, 53 y.o., male Today's Date: 01/05/2024   PCP: Dellar Fenton  REFERRING PROVIDER: Dellar Fenton  END OF SESSION:  PT End of Session - 01/05/24 0928     Visit Number 2    Number of Visits 24    Date for PT Re-Evaluation 03/22/24    PT Start Time 0930    PT Stop Time 1014    PT Time Calculation (min) 44 min    Equipment Utilized During Treatment Gait belt    Activity Tolerance Patient tolerated treatment well    Behavior During Therapy [facility] for tasks assessed/performed              Past Medical History:  Diagnosis Date   Allergy    Anemia    Bell's palsy    Diabetes mellitus without complication (HCC)    diet controlled   Fall 07/2023   Hypertension    Hypothyroidism    Kidney stones    Pseudotumor cerebri    Stroke [facility])    Past Surgical History:  Procedure Laterality Date   COLONOSCOPY WITH PROPOFOL  N/A 03/30/2020   Procedure: COLONOSCOPY WITH PROPOFOL ;  Surgeon: Shane Darling, MD;  Location: [facility] ENDOSCOPY;  Service: Endoscopy;  Laterality: N/A;   LOOP RECORDER INSERTION N/A 01/27/2018   Procedure: LOOP RECORDER INSERTION;  Surgeon: Verona Goodwill, MD;  Location: [facility] INVASIVE CV LAB;  Service: Cardiovascular;  Laterality: N/A;   LUMBAR PUNCTURE     as child   NO PAST SURGERIES     REMOVAL OF A DIALYSIS CATHETER     TEE WITHOUT CARDIOVERSION N/A 01/07/2018   Procedure: TRANSESOPHAGEAL ECHOCARDIOGRAM (TEE);  Surgeon: Devorah Fonder, MD;  Location: [facility] ORS;  Service: Cardiovascular;  Laterality: N/A;   Patient Active Problem List   Diagnosis Date Noted   Acute on chronic heart failure with preserved ejection fraction (HFpEF) (HCC) 12/26/2023   Community acquired pneumonia 12/24/2023   Acute respiratory failure with hypoxia (HCC) 12/23/2023   SVT (supraventricular tachycardia) (HCC) 08/10/2023   Atrial fibrillation with rapid  ventricular response (HCC) 07/03/2023   Atrial fibrillation with RVR (HCC) 07/02/2023   Fall 06/15/2023   Non-compliance with renal dialysis (HCC) 05/02/2023   Obesity (BMI 30-39.9) 04/30/2023   Volume overload 04/29/2023   Renal osteodystrophy 11/25/2022   Unsteady gait 10/05/2022   Steal syndrome of dialysis vascular access (HCC) 05/28/2022   Hydronephrosis, left 05/04/2022   Leukocytosis 05/04/2022   Chest pain 01/13/2022   Diabetic retinopathy associated with diabetes mellitus due to underlying condition (HCC) 01/13/2022   ESRD on hemodialysis (HCC) 01/13/2022   Deafness in right ear 08/11/2021   Open wound 01/25/2021   History of colon polyps 10/15/2020   Acute cholecystitis without calculus 09/09/2020   Type 2 diabetes mellitus, with long-term current use of insulin  (HCC) 09/09/2020   Cryptogenic stroke (HCC) 07/13/2020   History of loop recorder 07/13/2020   Elevated troponin 04/03/2020   Acquired trigger finger 06/08/2019   Lymphedema 06/08/2019   Anemia 04/17/2019   Swelling of both lower extremities 01/10/2019   Facial droop 04/09/2018   Daytime somnolence 03/30/2018   Carotid artery disease (HCC) 02/03/2018   Intracranial vascular stenosis 09/12/2017   Cough 01/20/2017   Bell's palsy 11/10/2016   History of CVA (cerebrovascular accident) 11/10/2016   Benign localized hyperplasia of prostate with urinary obstruction 10/27/2016   History of nephrolithiasis 10/27/2016   TIA (transient ischemic attack) 10/20/2016  Near syncope 06/23/2016   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   Neuropathy 08/06/2015   Health care maintenance 08/06/2015   Hypertension 08/06/2015   Hypothyroidism 10/10/2013   Microalbuminuria 10/10/2013   Hyperlipidemia 10/10/2013   B12 deficiency 10/10/2013   Environmental allergies 07/04/2013    ONSET DATE: 3 years ago  REFERRING DIAG: unsteady gait  THERAPY DIAG:  Muscle weakness (generalized)  Unsteadiness on feet  Difficulty in walking, not  elsewhere classified  Abnormality of gait and mobility  Rationale for Evaluation and Treatment: Rehabilitation  SUBJECTIVE:                                                                                                                                                                                             SUBJECTIVE STATEMENT: Patient reports L hip is hurting from wearing Afo daily. Isn't wearing AFO today.  Pt accompanied by: self  PERTINENT HISTORY: Patient returning to PT s/p hospitalization for pneumonia, , SVT, ESRD on hypodialysis. PMH DM, HTN, Anemia, Bells palsy, Hypothyroidism, Kidney stones, stroke and pseudotumour cerebri. Macular degeneration with impaired visual acuity   PAIN:  Are you having pain? Some L foot numbness/neuropathy  PRECAUTIONS: Fall  RED FLAGS: None   WEIGHT BEARING RESTRICTIONS: No  FALLS: Has patient fallen in last 6 months? No  LIVING ENVIRONMENT: Lives with: lives alone Lives in: House/apartment Stairs: yes stairs in house Has following equipment at home: Single point cane  PLOF: Independent with basic ADLs  PATIENT GOALS: balance, strength  OBJECTIVE:  Note: Objective measures were completed at Evaluation unless otherwise noted.  DIAGNOSTIC FINDINGS: IMPRESSION: Interval resolution of previously noted trace left parafalcine subdural hematoma. No acute intracranial process.  COGNITION: Overall cognitive status: Within functional limits for tasks assessed   SENSATION: WFL  COORDINATION: Heel slide: WFL RLE; slight decrease with LLE   MUSCLE TONE: WFL  POSTURE: rounded shoulders, forward head, and posterior pelvic tilt  LOWER EXTREMITY ROM:     Active  Right Eval Left Eval  Hip flexion    Hip extension    Hip abduction    Hip adduction    Hip internal rotation    Hip external rotation    Knee flexion    Knee extension    Ankle dorsiflexion  -3  Ankle plantarflexion  35  Ankle inversion  10  Ankle eversion  8    (Blank rows = not tested)  LOWER EXTREMITY MMT:    MMT Right Eval Left Eval  Hip flexion 4 4  Hip extension    Hip abduction 3 3  Hip adduction 3+ 3+  Hip internal rotation    Hip external  rotation    Knee flexion 4- 4-  Knee extension 4 4  Ankle dorsiflexion 3 2+  Ankle plantarflexion 3+ 3  Ankle inversion    Ankle eversion    (Blank rows = not tested)  BED MOBILITY:  Not tested  TRANSFERS: Sit to stand: CGA  Assistive device utilized: None     Stand to sit: CGA  Assistive device utilized: None     Chair to chair: CGA  Assistive device utilized: None       RAMP:  Not tested  CURB:  Findings: needs UE support to step up  STAIRS: heavy BUE support step over step negotiation.  GAIT: Findings: Gait Characteristics: decreased stride length, Left foot flat, wide BOS, and poor foot clearance- Left, Distance walked: 60 ft, Assistive device utilized:None, Level of assistance: CGA, and Comments: increased L ankle rolling due to not wearing AFO  FUNCTIONAL TESTS:  5 times sit to stand: 30.53 with 4 posterior LOB 6 minute walk test: perform next session  Berg Balance Scale: 26  PATIENT SURVEYS:  LEFS give next session                                                                                                                               TREATMENT DATE: 01/05/24 Physical Performance Measures:   Stair negotiation: heavy BUE support step over step negotiation.  6 Min Walk Test:  Instructed patient to ambulate as quickly and as safely as possible for 6 minutes using LRAD. Patient was allowed to take standing rest breaks without stopping the test, but if the patient required a sitting rest break the clock would be stopped and the test would be over.  Results: 710 feetwith CGA. Results indicate that the patient has reduced endurance with ambulation compared to age matched norms.  Age Matched Norms: 71-69 yo M: 14 F: 86, 85-79 yo M: 66 F: 471, 42-89 yo M: 417 F:  392 MDC: 58.21 meters (190.98 feet) or 50 meters (ANPTA Core Set of Outcome Measures for Adults with Neurologic Conditions, 2018)  LEFS:43  Neuro Re-ed:  Standing with CGA next to support surface:  Airex pad: static stand 30 seconds x 2 trials, noticeable trembling of ankles/LE's with fatigue and challenge to maintain stability Airex pad: horizontal head turns  scanning room 10x ; cueing for arc of motion ; x 2 sets Airex pad: vertical head turns 30 seconds, cueing for arc of motion, noticeable sway with upward gaze increasing demand on ankle righting reaction musculature Airex pad: one foot on 6" step one foot on airex pad, hold position for 30 seconds, switch legs, 2x each LE; Airex pad march: dual task or naming food in alphabetical order  TherAct: 10x STS ; one rest halfway Adduction 10x; 2 sets Adduction with heel raise 15x    PATIENT EDUCATION: Education details: goals, POC, HEP Person educated: Patient Education method: Explanation, Demonstration, Tactile cues, Verbal cues, and Handouts Education comprehension: verbalized understanding, returned  demonstration, verbal cues required, tactile cues required, and needs further education  HOME EXERCISE PROGRAM: Access Code: [email or web address] URL: [email or web address] Date: 12/29/2023 Prepared by: Raea Magallon  Exercises - Leg Extension  - 1 x daily - 7 x weekly - 2 sets - 10 reps - 5 hold - Seated March  - 1 x daily - 7 x weekly - 2 sets - 10 reps - 5 hold - Seated Heel Raise  - 1 x daily - 7 x weekly - 2 sets - 10 reps - 5 hold  GOALS: Goals reviewed with patient? Yes  SHORT TERM GOALS: Target date: 01/26/2024    Patient will be independent in home exercise program to improve strength/mobility for better functional independence with ADLs.  Baseline: Goal status: INITIAL  LONG TERM GOALS: Target date: 03/22/2024    Patient (< 4 years old) will complete five times sit to stand test in < 10 seconds indicating an  increased LE strength and improved balance.  Baseline: 30.53 with 4 posterior LOB Goal status: INITIAL  2.   Patient will increase Berg Balance score by >45/56 to demonstrate decreased fall risk during functional activities.  Baseline: 5/5: 26  Goal status: INITIAL  3.    Patient will increase six minute walk test distance to >1200 for progression to age norm community ambulator and improve gait ability  Baseline: perform next session 5/12: 710 ft  Goal status: INITIAL  4.  Patient will score > 60/80 on LEFS for improved functional mobility and quality of life.  Baseline: perform next session 5/12: 43 Goal status: INITIAL    ASSESSMENT:  CLINICAL IMPRESSION: Patient tolerated 6 minute walk test with occasional eversion and L foot collapse without LOB. Sit to stands remain challenging with frequent posterior LOB resulting in need for PT to assist to remain upright. Patient is highly motivated throughout session.  Patient will benefit from skilled physical therapy to improve strength, mobility, and stability.   OBJECTIVE IMPAIRMENTS: Abnormal gait, cardiopulmonary status limiting activity, decreased activity tolerance, decreased balance, decreased coordination, decreased endurance, decreased knowledge of use of DME, decreased mobility, difficulty walking, decreased ROM, decreased strength, dizziness, impaired perceived functional ability, impaired flexibility, impaired vision/preception, improper body mechanics, postural dysfunction, and obesity.   ACTIVITY LIMITATIONS: carrying, lifting, bending, sitting, standing, squatting, stairs, transfers, bed mobility, toileting, dressing, reach over head, hygiene/grooming, locomotion level, and caring for others  PARTICIPATION LIMITATIONS: meal prep, cleaning, laundry, driving, shopping, community activity, occupation, and yard work  PERSONAL FACTORS: Age, Behavior pattern, Fitness, Past/current experiences, Time since onset of  injury/illness/exacerbation, Transportation, and 3+ comorbidities: DM, HTN, Anemia, Bells palsy, Hypothyroidism, Kidney stones, stroke and pseudotumour cerebri. Macular degeneration with impaired visual acuity  are also affecting patient's functional outcome.   REHAB POTENTIAL: Good  CLINICAL DECISION MAKING: Evolving/moderate complexity  EVALUATION COMPLEXITY: Moderate  PLAN:  PT FREQUENCY: 2x/week  PT DURATION: 12 weeks  PLANNED INTERVENTIONS: 97164- PT Re-evaluation, 97750- Physical Performance Testing, 97110-Therapeutic exercises, 97530- Therapeutic activity, [id number]- Neuromuscular re-education, 97535- Self Care, [id number]- Manual therapy, [id number]- Gait training, [id number]- Orthotic Initial, [id number]- Orthotic/Prosthetic subsequent, [phone]- Canalith repositioning, [id number]- Splinting, V2536- Electrical stimulation (unattended), [phone]- Electrical stimulation (manual), [id number]- Vasopneumatic device, [id number]- Ultrasound, [id number]- Traction (mechanical), [id number]- Ionotophoresis 4mg /ml Dexamethasone , Patient/Family education, Balance training, Stair training, Taping, Dry Needling, Joint mobilization, Spinal mobilization, Scar mobilization, Compression bandaging, Vestibular training, Visual/preceptual remediation/compensation, Cognitive remediation, DME instructions, Cryotherapy, Moist heat, and Biofeedback  PLAN FOR NEXT SESSION: balance, strength, L ankle strength, stairs    Maeleigh Buschman, PT 01/05/2024, 10:15 AM

## 2024-01-05 ENCOUNTER — Ambulatory Visit: Payer: Medicare Other

## 2024-01-05 ENCOUNTER — Encounter: Payer: Self-pay | Admitting: Internal Medicine

## 2024-01-05 DIAGNOSIS — R262 Difficulty in walking, not elsewhere classified: Secondary | ICD-10-CM

## 2024-01-05 DIAGNOSIS — R269 Unspecified abnormalities of gait and mobility: Secondary | ICD-10-CM

## 2024-01-05 DIAGNOSIS — M6281 Muscle weakness (generalized): Secondary | ICD-10-CM | POA: Diagnosis not present

## 2024-01-05 DIAGNOSIS — R2681 Unsteadiness on feet: Secondary | ICD-10-CM

## 2024-01-05 NOTE — Assessment & Plan Note (Signed)
 History of CVA with previous left side weakness. Per review, on eliquis . Continue blood pressure control. Continue eliquis .

## 2024-01-05 NOTE — Assessment & Plan Note (Signed)
 On thyroid  replacement. Needs tsh checked. Just had labs through [facility]. Will see if they can draw tsh with next labs. He wanted to hold having drawn today.

## 2024-01-05 NOTE — Assessment & Plan Note (Signed)
 Low cholesterol diet and exercise.  Follow lipid panel.

## 2024-01-05 NOTE — Assessment & Plan Note (Signed)
 Last carotid ultrasound - <50% bilaterally.  Needs to take statin and keep blood pressure under control. Blood pressure as outlined.

## 2024-01-05 NOTE — Assessment & Plan Note (Signed)
 Discussed his dialysis schedule today.  Needs to keep regular schedule to avoid volume overload.

## 2024-01-05 NOTE — Assessment & Plan Note (Signed)
 Blood pressure as outlined.  Continue hydralazine , cardizem  and metoprolol . Follow pressures.  Follow metabolic panel. Just had labs drawn at dialysis. Obtain results.

## 2024-01-05 NOTE — Assessment & Plan Note (Signed)
 Continue lyrica

## 2024-01-05 NOTE — Assessment & Plan Note (Signed)
 Low carb diet and exercise.  No change in medication.  Seeing endocrinology.  Denies low sugars. Follow met b and A1c.

## 2024-01-05 NOTE — Assessment & Plan Note (Signed)
 Working with PT. Continue.

## 2024-01-06 ENCOUNTER — Ambulatory Visit: Payer: Medicare Other | Admitting: Internal Medicine

## 2024-01-07 ENCOUNTER — Ambulatory Visit: Payer: Medicare Other | Admitting: Physical Therapy

## 2024-01-07 DIAGNOSIS — R2689 Other abnormalities of gait and mobility: Secondary | ICD-10-CM

## 2024-01-07 DIAGNOSIS — R269 Unspecified abnormalities of gait and mobility: Secondary | ICD-10-CM

## 2024-01-07 DIAGNOSIS — R2681 Unsteadiness on feet: Secondary | ICD-10-CM

## 2024-01-07 DIAGNOSIS — M6281 Muscle weakness (generalized): Secondary | ICD-10-CM | POA: Diagnosis not present

## 2024-01-07 DIAGNOSIS — R278 Other lack of coordination: Secondary | ICD-10-CM

## 2024-01-07 DIAGNOSIS — R262 Difficulty in walking, not elsewhere classified: Secondary | ICD-10-CM

## 2024-01-07 NOTE — Therapy (Signed)
 OUTPATIENT PHYSICAL THERAPY NEURO TREATMENT   Patient Name: Gregory Crane MRN: [id number] DOB:[birth year], 53 y.o., male Today's Date: 01/07/2024   PCP: Dellar Fenton  REFERRING PROVIDER: Dellar Fenton  END OF SESSION:  PT End of Session - 01/07/24 1153     Visit Number 3    Number of Visits 24    Date for PT Re-Evaluation 03/22/24    PT Start Time 1151    PT Stop Time 1230    PT Time Calculation (min) 39 min    Equipment Utilized During Treatment Gait belt    Activity Tolerance Patient tolerated treatment well    Behavior During Therapy [place] for tasks assessed/performed              Past Medical History:  Diagnosis Date   Allergy    Anemia    Bell's palsy    Diabetes mellitus without complication (HCC)    diet controlled   Fall 07/2023   Hypertension    Hypothyroidism    Kidney stones    Pseudotumor cerebri    Stroke [facility])    Past Surgical History:  Procedure Laterality Date   COLONOSCOPY WITH PROPOFOL  N/A 03/30/2020   Procedure: COLONOSCOPY WITH PROPOFOL ;  Surgeon: Shane Darling, MD;  Location: [facility] ENDOSCOPY;  Service: Endoscopy;  Laterality: N/A;   LOOP RECORDER INSERTION N/A 01/27/2018   Procedure: LOOP RECORDER INSERTION;  Surgeon: Verona Goodwill, MD;  Location: [facility] INVASIVE CV LAB;  Service: Cardiovascular;  Laterality: N/A;   LUMBAR PUNCTURE     as child   NO PAST SURGERIES     REMOVAL OF A DIALYSIS CATHETER     TEE WITHOUT CARDIOVERSION N/A 01/07/2018   Procedure: TRANSESOPHAGEAL ECHOCARDIOGRAM (TEE);  Surgeon: Devorah Fonder, MD;  Location: [facility] ORS;  Service: Cardiovascular;  Laterality: N/A;   Patient Active Problem List   Diagnosis Date Noted   Acute on chronic heart failure with preserved ejection fraction (HFpEF) (HCC) 12/26/2023   Community acquired pneumonia 12/24/2023   Acute respiratory failure with hypoxia (HCC) 12/23/2023   SVT (supraventricular tachycardia) (HCC) 08/10/2023   Atrial fibrillation with rapid  ventricular response (HCC) 07/03/2023   Atrial fibrillation with RVR (HCC) 07/02/2023   Fall 06/15/2023   Non-compliance with renal dialysis (HCC) 05/02/2023   Obesity (BMI 30-39.9) 04/30/2023   Volume overload 04/29/2023   Renal osteodystrophy 11/25/2022   Unsteady gait 10/05/2022   Steal syndrome of dialysis vascular access (HCC) 05/28/2022   Hydronephrosis, left 05/04/2022   Leukocytosis 05/04/2022   Chest pain 01/13/2022   Diabetic retinopathy associated with diabetes mellitus due to underlying condition (HCC) 01/13/2022   ESRD on hemodialysis (HCC) 01/13/2022   Deafness in right ear 08/11/2021   Open wound 01/25/2021   History of colon polyps 10/15/2020   Acute cholecystitis without calculus 09/09/2020   Type 2 diabetes mellitus, with long-term current use of insulin  (HCC) 09/09/2020   Cryptogenic stroke (HCC) 07/13/2020   History of loop recorder 07/13/2020   Elevated troponin 04/03/2020   Acquired trigger finger 06/08/2019   Lymphedema 06/08/2019   Anemia 04/17/2019   Swelling of both lower extremities 01/10/2019   Facial droop 04/09/2018   Daytime somnolence 03/30/2018   Carotid artery disease (HCC) 02/03/2018   Intracranial vascular stenosis 09/12/2017   Cough 01/20/2017   Bell's palsy 11/10/2016   History of CVA (cerebrovascular accident) 11/10/2016   Benign localized hyperplasia of prostate with urinary obstruction 10/27/2016   History of nephrolithiasis 10/27/2016   TIA (transient ischemic attack) 10/20/2016  Near syncope 06/23/2016   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   Neuropathy 08/06/2015   Health care maintenance 08/06/2015   Hypertension 08/06/2015   Hypothyroidism 10/10/2013   Microalbuminuria 10/10/2013   Hyperlipidemia 10/10/2013   B12 deficiency 10/10/2013   Environmental allergies 07/04/2013    ONSET DATE: 3 years ago  REFERRING DIAG: unsteady gait  THERAPY DIAG:  Muscle weakness (generalized)  Abnormality of gait and mobility  Unsteadiness  on feet  Other abnormalities of gait and mobility  Other lack of coordination  Difficulty in walking, not elsewhere classified  Rationale for Evaluation and Treatment: Rehabilitation  SUBJECTIVE:                                                                                                                                                                                             SUBJECTIVE STATEMENT: Patient reports L hip is hurting from wearing Afo daily. Isn't wearing AFO today.  Pt accompanied by: self  PERTINENT HISTORY: Patient returning to PT s/p hospitalization for pneumonia, , SVT, ESRD on hypodialysis. PMH DM, HTN, Anemia, Bells palsy, Hypothyroidism, Kidney stones, stroke and pseudotumour cerebri. Macular degeneration with impaired visual acuity   PAIN:  Are you having pain? Some L foot numbness/neuropathy  PRECAUTIONS: Fall  RED FLAGS: None   WEIGHT BEARING RESTRICTIONS: No  FALLS: Has patient fallen in last 6 months? No  LIVING ENVIRONMENT: Lives with: lives alone Lives in: House/apartment Stairs: yes stairs in house Has following equipment at home: Single point cane  PLOF: Independent with basic ADLs  PATIENT GOALS: balance, strength  OBJECTIVE:  Note: Objective measures were completed at Evaluation unless otherwise noted.  DIAGNOSTIC FINDINGS: IMPRESSION: Interval resolution of previously noted trace left parafalcine subdural hematoma. No acute intracranial process.  COGNITION: Overall cognitive status: Within functional limits for tasks assessed   SENSATION: WFL  COORDINATION: Heel slide: WFL RLE; slight decrease with LLE   MUSCLE TONE: WFL  POSTURE: rounded shoulders, forward head, and posterior pelvic tilt  LOWER EXTREMITY ROM:     Active  Right Eval Left Eval  Hip flexion    Hip extension    Hip abduction    Hip adduction    Hip internal rotation    Hip external rotation    Knee flexion    Knee extension    Ankle  dorsiflexion  -3  Ankle plantarflexion  35  Ankle inversion  10  Ankle eversion  8   (Blank rows = not tested)  LOWER EXTREMITY MMT:    MMT Right Eval Left Eval  Hip flexion 4 4  Hip extension    Hip abduction 3 3  Hip  adduction 3+ 3+  Hip internal rotation    Hip external rotation    Knee flexion 4- 4-  Knee extension 4 4  Ankle dorsiflexion 3 2+  Ankle plantarflexion 3+ 3  Ankle inversion    Ankle eversion    (Blank rows = not tested)  BED MOBILITY:  Not tested  TRANSFERS: Sit to stand: CGA  Assistive device utilized: None     Stand to sit: CGA  Assistive device utilized: None     Chair to chair: CGA  Assistive device utilized: None       RAMP:  Not tested  CURB:  Findings: needs UE support to step up  STAIRS: heavy BUE support step over step negotiation.  GAIT: Findings: Gait Characteristics: decreased stride length, Left foot flat, wide BOS, and poor foot clearance- Left, Distance walked: 60 ft, Assistive device utilized:None, Level of assistance: CGA, and Comments: increased L ankle rolling due to not wearing AFO  FUNCTIONAL TESTS:  5 times sit to stand: 30.53 with 4 posterior LOB 6 minute walk test: perform next session  Berg Balance Scale: 26  PATIENT SURVEYS:  LEFS give next session                                                                                                                               TREATMENT DATE: 01/07/24  TherAct:  10x 2 STS; no rests Seated Adduction GTB x 12  Seated hip flexion x 12 bil GTB  Ankle pumps x 15 bil  Foot tap on 6inch step x 12 bil  1 foot on 6inch step 20 sec x 2 bil  LAQ 4# AW x 12 bil  Isometric Hip adduction x 15  Side stepping at rail with 4# AW x 6 bil  Forward/reverse gait without resistance 63ft x 5   Multiple prolonged rest breaks required due to BLE fatigue and SOB. Pt was able to recover with seated rest breaks without hydration. Supervision assist from PT for safety for all standing  interventions.   PATIENT EDUCATION: Education details: goals, POC, HEP Person educated: Patient Education method: Explanation, Demonstration, Tactile cues, Verbal cues, and Handouts Education comprehension: verbalized understanding, returned demonstration, verbal cues required, tactile cues required, and needs further education  HOME EXERCISE PROGRAM: Access Code: [email or web address] URL: [email or web address] Date: 12/29/2023 Prepared by: Marina  Moser  Exercises - Leg Extension  - 1 x daily - 7 x weekly - 2 sets - 10 reps - 5 hold - Seated March  - 1 x daily - 7 x weekly - 2 sets - 10 reps - 5 hold - Seated Heel Raise  - 1 x daily - 7 x weekly - 2 sets - 10 reps - 5 hold  GOALS: Goals reviewed with patient? Yes  SHORT TERM GOALS: Target date: 01/26/2024    Patient will be independent in home exercise program to improve strength/mobility for better functional independence with ADLs.  Baseline: Goal status: INITIAL  LONG  TERM GOALS: Target date: 03/22/2024    Patient (< 87 years old) will complete five times sit to stand test in < 10 seconds indicating an increased LE strength and improved balance.  Baseline: 30.53 with 4 posterior LOB Goal status: INITIAL  2.   Patient will increase Berg Balance score by >45/56 to demonstrate decreased fall risk during functional activities.  Baseline: 5/5: 26  Goal status: INITIAL  3.    Patient will increase six minute walk test distance to >1200 for progression to age norm community ambulator and improve gait ability  Baseline: perform next session 5/12: 710 ft  Goal status: INITIAL  4.  Patient will score > 60/80 on LEFS for improved functional mobility and quality of life.  Baseline: perform next session 5/12: 43 Goal status: INITIAL    ASSESSMENT:  CLINICAL IMPRESSION: Patient put forth excellent effort throughout session. And highly motivated to progress, BLE fatigue required mutliple therapeutic rest breaks throughout  session but was to have improved confidence with dynamic movement training for reciprocal foot taps, side stepping and forward reverse gait.   Patient will benefit from skilled physical therapy to improve strength, mobility, and stability.   OBJECTIVE IMPAIRMENTS: Abnormal gait, cardiopulmonary status limiting activity, decreased activity tolerance, decreased balance, decreased coordination, decreased endurance, decreased knowledge of use of DME, decreased mobility, difficulty walking, decreased ROM, decreased strength, dizziness, impaired perceived functional ability, impaired flexibility, impaired vision/preception, improper body mechanics, postural dysfunction, and obesity.   ACTIVITY LIMITATIONS: carrying, lifting, bending, sitting, standing, squatting, stairs, transfers, bed mobility, toileting, dressing, reach over head, hygiene/grooming, locomotion level, and caring for others  PARTICIPATION LIMITATIONS: meal prep, cleaning, laundry, driving, shopping, community activity, occupation, and yard work  PERSONAL FACTORS: Age, Behavior pattern, Fitness, Past/current experiences, Time since onset of injury/illness/exacerbation, Transportation, and 3+ comorbidities: DM, HTN, Anemia, Bells palsy, Hypothyroidism, Kidney stones, stroke and pseudotumour cerebri. Macular degeneration with impaired visual acuity  are also affecting patient's functional outcome.   REHAB POTENTIAL: Good  CLINICAL DECISION MAKING: Evolving/moderate complexity  EVALUATION COMPLEXITY: Moderate  PLAN:  PT FREQUENCY: 2x/week  PT DURATION: 12 weeks  PLANNED INTERVENTIONS: 97164- PT Re-evaluation, 97750- Physical Performance Testing, 97110-Therapeutic exercises, 97530- Therapeutic activity, [id number]- Neuromuscular re-education, 97535- Self Care, [id number]- Manual therapy, [id number]- Gait training, [id number]- Orthotic Initial, [id number]- Orthotic/Prosthetic subsequent, [phone]- Canalith repositioning, [id number]- Splinting, Q6578- Electrical stimulation  (unattended), [phone]- Electrical stimulation (manual), [id number]- Vasopneumatic device, [id number]- Ultrasound, [id number]- Traction (mechanical), [id number]- Ionotophoresis 4mg /ml Dexamethasone , Patient/Family education, Balance training, Stair training, Taping, Dry Needling, Joint mobilization, Spinal mobilization, Scar mobilization, Compression bandaging, Vestibular training, Visual/preceptual remediation/compensation, Cognitive remediation, DME instructions, Cryotherapy, Moist heat, and Biofeedback  PLAN FOR NEXT SESSION:   balance, strength, L ankle strength, stairs    Barbara Book, PT 01/07/2024, 11:53 AM

## 2024-01-12 ENCOUNTER — Ambulatory Visit: Payer: Medicare Other

## 2024-01-14 ENCOUNTER — Ambulatory Visit: Payer: Medicare Other

## 2024-01-20 NOTE — Therapy (Signed)
 OUTPATIENT PHYSICAL THERAPY NEURO TREATMENT   Patient Name: Gregory Crane MRN: [id number] DOB:September 01, 1970, 53 y.o., male Today's Date: 01/21/2024   PCP: Dellar Fenton  REFERRING PROVIDER: Dellar Fenton  END OF SESSION:  PT End of Session - 01/21/24 1359     Visit Number 4    Number of Visits 24    Date for PT Re-Evaluation 03/22/24    PT Start Time 1400    PT Stop Time 1444    PT Time Calculation (min) 44 min    Equipment Utilized During Treatment Gait belt    Activity Tolerance Patient tolerated treatment well    Behavior During Therapy [facility] for tasks assessed/performed               Past Medical History:  Diagnosis Date   Allergy    Anemia    Bell's palsy    Diabetes mellitus without complication (HCC)    diet controlled   Fall 07/2023   Hypertension    Hypothyroidism    Kidney stones    Pseudotumor cerebri    Stroke [facility])    Past Surgical History:  Procedure Laterality Date   COLONOSCOPY WITH PROPOFOL  N/A 03/30/2020   Procedure: COLONOSCOPY WITH PROPOFOL ;  Surgeon: Shane Darling, MD;  Location: [facility] ENDOSCOPY;  Service: Endoscopy;  Laterality: N/A;   LOOP RECORDER INSERTION N/A 01/27/2018   Procedure: LOOP RECORDER INSERTION;  Surgeon: Verona Goodwill, MD;  Location: [facility] INVASIVE CV LAB;  Service: Cardiovascular;  Laterality: N/A;   LUMBAR PUNCTURE     as child   NO PAST SURGERIES     REMOVAL OF A DIALYSIS CATHETER     TEE WITHOUT CARDIOVERSION N/A 01/07/2018   Procedure: TRANSESOPHAGEAL ECHOCARDIOGRAM (TEE);  Surgeon: Devorah Fonder, MD;  Location: [facility] ORS;  Service: Cardiovascular;  Laterality: N/A;   Patient Active Problem List   Diagnosis Date Noted   Acute on chronic heart failure with preserved ejection fraction (HFpEF) (HCC) 12/26/2023   Community acquired pneumonia 12/24/2023   Acute respiratory failure with hypoxia (HCC) 12/23/2023   SVT (supraventricular tachycardia) (HCC) 08/10/2023   Atrial fibrillation with rapid  ventricular response (HCC) 07/03/2023   Atrial fibrillation with RVR (HCC) 07/02/2023   Fall 06/15/2023   Non-compliance with renal dialysis (HCC) 05/02/2023   Obesity (BMI 30-39.9) 04/30/2023   Volume overload 04/29/2023   Renal osteodystrophy 11/25/2022   Unsteady gait 10/05/2022   Steal syndrome of dialysis vascular access (HCC) 05/28/2022   Hydronephrosis, left 05/04/2022   Leukocytosis 05/04/2022   Chest pain 01/13/2022   Diabetic retinopathy associated with diabetes mellitus due to underlying condition (HCC) 01/13/2022   ESRD on hemodialysis (HCC) 01/13/2022   Deafness in right ear 08/11/2021   Open wound 01/25/2021   History of colon polyps 10/15/2020   Acute cholecystitis without calculus 09/09/2020   Type 2 diabetes mellitus, with long-term current use of insulin  (HCC) 09/09/2020   Cryptogenic stroke (HCC) 07/13/2020   History of loop recorder 07/13/2020   Elevated troponin 04/03/2020   Acquired trigger finger 06/08/2019   Lymphedema 06/08/2019   Anemia 04/17/2019   Swelling of both lower extremities 01/10/2019   Facial droop 04/09/2018   Daytime somnolence 03/30/2018   Carotid artery disease (HCC) 02/03/2018   Intracranial vascular stenosis 09/12/2017   Cough 01/20/2017   Bell's palsy 11/10/2016   History of CVA (cerebrovascular accident) 11/10/2016   Benign localized hyperplasia of prostate with urinary obstruction 10/27/2016   History of nephrolithiasis 10/27/2016   TIA (transient ischemic attack) 10/20/2016  Near syncope 06/23/2016   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   Neuropathy 08/06/2015   Health care maintenance 08/06/2015   Hypertension 08/06/2015   Hypothyroidism 10/10/2013   Microalbuminuria 10/10/2013   Hyperlipidemia 10/10/2013   B12 deficiency 10/10/2013   Environmental allergies 07/04/2013    ONSET DATE: 3 years ago  REFERRING DIAG: unsteady gait  THERAPY DIAG:  Muscle weakness (generalized)  Abnormality of gait and mobility  Unsteadiness  on feet  Other abnormalities of gait and mobility  Rationale for Evaluation and Treatment: Rehabilitation  SUBJECTIVE:                                                                                                                                                                                             SUBJECTIVE STATEMENT: Patient fell last week, hit his head on the side table. Got up too quick from the bed and hurried to the lightswitch. No changes in vision, dizziness in head s/p head injury   Patient did have two half mile walks this week.   Pt accompanied by: self  PERTINENT HISTORY: Patient returning to PT s/p hospitalization for pneumonia, , SVT, ESRD on hypodialysis. PMH DM, HTN, Anemia, Bells palsy, Hypothyroidism, Kidney stones, stroke and pseudotumour cerebri. Macular degeneration with impaired visual acuity   PAIN:  Are you having pain? Some L foot numbness/neuropathy  PRECAUTIONS: Fall  RED FLAGS: None   WEIGHT BEARING RESTRICTIONS: No  FALLS: Has patient fallen in last 6 months? No  LIVING ENVIRONMENT: Lives with: lives alone Lives in: House/apartment Stairs: yes stairs in house Has following equipment at home: Single point cane  PLOF: Independent with basic ADLs  PATIENT GOALS: balance, strength  OBJECTIVE:  Note: Objective measures were completed at Evaluation unless otherwise noted.  DIAGNOSTIC FINDINGS: IMPRESSION: Interval resolution of previously noted trace left parafalcine subdural hematoma. No acute intracranial process.  COGNITION: Overall cognitive status: Within functional limits for tasks assessed   SENSATION: WFL  COORDINATION: Heel slide: WFL RLE; slight decrease with LLE   MUSCLE TONE: WFL  POSTURE: rounded shoulders, forward head, and posterior pelvic tilt  LOWER EXTREMITY ROM:     Active  Right Eval Left Eval  Hip flexion    Hip extension    Hip abduction    Hip adduction    Hip internal rotation    Hip external  rotation    Knee flexion    Knee extension    Ankle dorsiflexion  -3  Ankle plantarflexion  35  Ankle inversion  10  Ankle eversion  8   (Blank rows = not tested)  LOWER EXTREMITY MMT:    MMT Right Eval  Left Eval  Hip flexion 4 4  Hip extension    Hip abduction 3 3  Hip adduction 3+ 3+  Hip internal rotation    Hip external rotation    Knee flexion 4- 4-  Knee extension 4 4  Ankle dorsiflexion 3 2+  Ankle plantarflexion 3+ 3  Ankle inversion    Ankle eversion    (Blank rows = not tested)  BED MOBILITY:  Not tested  TRANSFERS: Sit to stand: CGA  Assistive device utilized: None     Stand to sit: CGA  Assistive device utilized: None     Chair to chair: CGA  Assistive device utilized: None       RAMP:  Not tested  CURB:  Findings: needs UE support to step up  STAIRS: heavy BUE support step over step negotiation.  GAIT: Findings: Gait Characteristics: decreased stride length, Left foot flat, wide BOS, and poor foot clearance- Left, Distance walked: 60 ft, Assistive device utilized:None, Level of assistance: CGA, and Comments: increased L ankle rolling due to not wearing AFO  FUNCTIONAL TESTS:  5 times sit to stand: 30.53 with 4 posterior LOB 6 minute walk test: perform next session  Berg Balance Scale: 26  PATIENT SURVEYS:  LEFS give next session                                                                                                                               TREATMENT DATE: 01/21/24  TherAct:  10x 2 STS; no rests 6" step: -Toe taps 10x each LE -Step up 10x each LE with UE support  Hip extension 10x each LE   TherEx Seated LAQ 4lb ankle weight; 10x each LE ;2 sets  Seated march 4lb ankle weights: 10x each LE; 2 sets Straight leg abduction/adduction 10x each LE    Multiple prolonged rest breaks required due to BLE fatigue and SOB. Pt was able to recover with seated rest breaks without hydration. Supervision assist from PT for safety for all  standing interventions. Some hip pain noted in L hip throughout session.   PATIENT EDUCATION: Education details: goals, POC, HEP Person educated: Patient Education method: Explanation, Demonstration, Tactile cues, Verbal cues, and Handouts Education comprehension: verbalized understanding, returned demonstration, verbal cues required, tactile cues required, and needs further education  HOME EXERCISE PROGRAM: Access Code: [email or web address] URL: [email or web address] Date: 12/29/2023 Prepared by: Adiva Boettner  Exercises - Leg Extension  - 1 x daily - 7 x weekly - 2 sets - 10 reps - 5 hold - Seated March  - 1 x daily - 7 x weekly - 2 sets - 10 reps - 5 hold - Seated Heel Raise  - 1 x daily - 7 x weekly - 2 sets - 10 reps - 5 hold  GOALS: Goals reviewed with patient? Yes  SHORT TERM GOALS: Target date: 01/26/2024    Patient will be independent in home exercise program to improve strength/mobility for better functional independence  with ADLs.  Baseline: Goal status: INITIAL  LONG TERM GOALS: Target date: 03/22/2024    Patient (< 80 years old) will complete five times sit to stand test in < 10 seconds indicating an increased LE strength and improved balance.  Baseline: 30.53 with 4 posterior LOB Goal status: INITIAL  2.   Patient will increase Berg Balance score by >45/56 to demonstrate decreased fall risk during functional activities.  Baseline: 5/5: 26  Goal status: INITIAL  3.    Patient will increase six minute walk test distance to >1200 for progression to age norm community ambulator and improve gait ability  Baseline: perform next session 5/12: 710 ft  Goal status: INITIAL  4.  Patient will score > 60/80 on LEFS for improved functional mobility and quality of life.  Baseline: perform next session 5/12: 43 Goal status: INITIAL    ASSESSMENT:  CLINICAL IMPRESSION:  Physician notified about continued L hip pain, will set up an X ray. Patient has occasional  posterior LOB with sit to stands and fatigues quickly with sit to stands.  Patient will benefit from skilled physical therapy to improve strength, mobility, and stability.   OBJECTIVE IMPAIRMENTS: Abnormal gait, cardiopulmonary status limiting activity, decreased activity tolerance, decreased balance, decreased coordination, decreased endurance, decreased knowledge of use of DME, decreased mobility, difficulty walking, decreased ROM, decreased strength, dizziness, impaired perceived functional ability, impaired flexibility, impaired vision/preception, improper body mechanics, postural dysfunction, and obesity.   ACTIVITY LIMITATIONS: carrying, lifting, bending, sitting, standing, squatting, stairs, transfers, bed mobility, toileting, dressing, reach over head, hygiene/grooming, locomotion level, and caring for others  PARTICIPATION LIMITATIONS: meal prep, cleaning, laundry, driving, shopping, community activity, occupation, and yard work  PERSONAL FACTORS: Age, Behavior pattern, Fitness, Past/current experiences, Time since onset of injury/illness/exacerbation, Transportation, and 3+ comorbidities: DM, HTN, Anemia, Bells palsy, Hypothyroidism, Kidney stones, stroke and pseudotumour cerebri. Macular degeneration with impaired visual acuity  are also affecting patient's functional outcome.   REHAB POTENTIAL: Good  CLINICAL DECISION MAKING: Evolving/moderate complexity  EVALUATION COMPLEXITY: Moderate  PLAN:  PT FREQUENCY: 2x/week  PT DURATION: 12 weeks  PLANNED INTERVENTIONS: 97164- PT Re-evaluation, 97750- Physical Performance Testing, 97110-Therapeutic exercises, 97530- Therapeutic activity, [id number]- Neuromuscular re-education, 97535- Self Care, 09811- Manual therapy, [id number]- Gait training, [id number]- Orthotic Initial, [id number]- Orthotic/Prosthetic subsequent, [phone]- Canalith repositioning, [id number]- Splinting, G9562- Electrical stimulation (unattended), [phone]- Electrical stimulation (manual), [id number]-  Vasopneumatic device, [id number]- Ultrasound, [id number]- Traction (mechanical), [id number]- Ionotophoresis 4mg /ml Dexamethasone , Patient/Family education, Balance training, Stair training, Taping, Dry Needling, Joint mobilization, Spinal mobilization, Scar mobilization, Compression bandaging, Vestibular training, Visual/preceptual remediation/compensation, Cognitive remediation, DME instructions, Cryotherapy, Moist heat, and Biofeedback  PLAN FOR NEXT SESSION:   balance, strength, L ankle strength, stairs    Mccauley Diehl, PT 01/21/2024, 2:45 PM

## 2024-01-21 ENCOUNTER — Telehealth: Payer: Self-pay | Admitting: Internal Medicine

## 2024-01-21 ENCOUNTER — Ambulatory Visit: Payer: Medicare Other

## 2024-01-21 DIAGNOSIS — M6281 Muscle weakness (generalized): Secondary | ICD-10-CM | POA: Diagnosis not present

## 2024-01-21 DIAGNOSIS — R2681 Unsteadiness on feet: Secondary | ICD-10-CM

## 2024-01-21 DIAGNOSIS — R2689 Other abnormalities of gait and mobility: Secondary | ICD-10-CM

## 2024-01-21 DIAGNOSIS — R269 Unspecified abnormalities of gait and mobility: Secondary | ICD-10-CM

## 2024-01-21 NOTE — Telephone Encounter (Signed)
 Physical therapist (Marina  Brain Cahill) called and reported that Mr Gregory Crane is having left hip pain. Please call and confirm what symptoms he is having. He will need an appt scheduled.  We can look at schedule.

## 2024-01-22 NOTE — Telephone Encounter (Signed)
 Copied from CRM [id number]. Topic: General - Other >> Jan 22, 2024  2:02 PM Caliyah H wrote: Reason for CRM: Patient returned a missed call from the nurse earlier today.

## 2024-01-22 NOTE — Telephone Encounter (Signed)
 LM for patient

## 2024-01-22 NOTE — Therapy (Signed)
 OUTPATIENT PHYSICAL THERAPY NEURO TREATMENT   Patient Name: Ragan Reale MRN: [id number] DOB:12/13/1970, 53 y.o., male Today's Date: 01/26/2024   PCP: Dellar Fenton  REFERRING PROVIDER: Dellar Fenton  END OF SESSION:  PT End of Session - 01/26/24 0931     Visit Number 5    Number of Visits 24    Date for PT Re-Evaluation 03/22/24    PT Start Time 0931    PT Stop Time 1014    PT Time Calculation (min) 43 min    Equipment Utilized During Treatment Gait belt    Activity Tolerance Patient tolerated treatment well    Behavior During Therapy [name] for tasks assessed/performed                Past Medical History:  Diagnosis Date   Allergy    Anemia    Bell's palsy    Diabetes mellitus without complication (HCC)    diet controlled   Fall 07/2023   Hypertension    Hypothyroidism    Kidney stones    Pseudotumor cerebri    Stroke [facility])    Past Surgical History:  Procedure Laterality Date   COLONOSCOPY WITH PROPOFOL  N/A 03/30/2020   Procedure: COLONOSCOPY WITH PROPOFOL ;  Surgeon: Shane Darling, MD;  Location: [facility] ENDOSCOPY;  Service: Endoscopy;  Laterality: N/A;   LOOP RECORDER INSERTION N/A 01/27/2018   Procedure: LOOP RECORDER INSERTION;  Surgeon: Verona Goodwill, MD;  Location: [facility] INVASIVE CV LAB;  Service: Cardiovascular;  Laterality: N/A;   LUMBAR PUNCTURE     as child   NO PAST SURGERIES     REMOVAL OF A DIALYSIS CATHETER     TEE WITHOUT CARDIOVERSION N/A 01/07/2018   Procedure: TRANSESOPHAGEAL ECHOCARDIOGRAM (TEE);  Surgeon: Devorah Fonder, MD;  Location: [facility] ORS;  Service: Cardiovascular;  Laterality: N/A;   Patient Active Problem List   Diagnosis Date Noted   Acute on chronic heart failure with preserved ejection fraction (HFpEF) (HCC) 12/26/2023   Community acquired pneumonia 12/24/2023   Acute respiratory failure with hypoxia (HCC) 12/23/2023   SVT (supraventricular tachycardia) (HCC) 08/10/2023   Atrial fibrillation with rapid  ventricular response (HCC) 07/03/2023   Atrial fibrillation with RVR (HCC) 07/02/2023   Fall 06/15/2023   Non-compliance with renal dialysis (HCC) 05/02/2023   Obesity (BMI 30-39.9) 04/30/2023   Volume overload 04/29/2023   Renal osteodystrophy 11/25/2022   Unsteady gait 10/05/2022   Steal syndrome of dialysis vascular access (HCC) 05/28/2022   Hydronephrosis, left 05/04/2022   Leukocytosis 05/04/2022   Chest pain 01/13/2022   Diabetic retinopathy associated with diabetes mellitus due to underlying condition (HCC) 01/13/2022   ESRD on hemodialysis (HCC) 01/13/2022   Deafness in right ear 08/11/2021   Open wound 01/25/2021   History of colon polyps 10/15/2020   Acute cholecystitis without calculus 09/09/2020   Type 2 diabetes mellitus, with long-term current use of insulin  (HCC) 09/09/2020   Cryptogenic stroke (HCC) 07/13/2020   History of loop recorder 07/13/2020   Elevated troponin 04/03/2020   Acquired trigger finger 06/08/2019   Lymphedema 06/08/2019   Anemia 04/17/2019   Swelling of both lower extremities 01/10/2019   Facial droop 04/09/2018   Daytime somnolence 03/30/2018   Carotid artery disease (HCC) 02/03/2018   Intracranial vascular stenosis 09/12/2017   Cough 01/20/2017   Bell's palsy 11/10/2016   History of CVA (cerebrovascular accident) 11/10/2016   Benign localized hyperplasia of prostate with urinary obstruction 10/27/2016   History of nephrolithiasis 10/27/2016   TIA (transient ischemic attack)  10/20/2016   Near syncope 06/23/2016   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   Neuropathy 08/06/2015   Health care maintenance 08/06/2015   Hypertension 08/06/2015   Hypothyroidism 10/10/2013   Microalbuminuria 10/10/2013   Hyperlipidemia 10/10/2013   B12 deficiency 10/10/2013   Environmental allergies 07/04/2013    ONSET DATE: 3 years ago  REFERRING DIAG: unsteady gait  THERAPY DIAG:  Muscle weakness (generalized)  Abnormality of gait and mobility  Unsteadiness  on feet  Other abnormalities of gait and mobility  Rationale for Evaluation and Treatment: Rehabilitation  SUBJECTIVE:                                                                                                                                                                                             SUBJECTIVE STATEMENT: Patient reports having an off day. No falls or LOB since last session. Patient reports his is getting better.   Patient did have two half mile walks this week.   Pt accompanied by: self  PERTINENT HISTORY: Patient returning to PT s/p hospitalization for pneumonia, , SVT, ESRD on hypodialysis. PMH DM, HTN, Anemia, Bells palsy, Hypothyroidism, Kidney stones, stroke and pseudotumour cerebri. Macular degeneration with impaired visual acuity   PAIN:  Are you having pain? Some L foot numbness/neuropathy  PRECAUTIONS: Fall  RED FLAGS: None   WEIGHT BEARING RESTRICTIONS: No  FALLS: Has patient fallen in last 6 months? No  LIVING ENVIRONMENT: Lives with: lives alone Lives in: House/apartment Stairs: yes stairs in house Has following equipment at home: Single point cane  PLOF: Independent with basic ADLs  PATIENT GOALS: balance, strength  OBJECTIVE:  Note: Objective measures were completed at Evaluation unless otherwise noted.  DIAGNOSTIC FINDINGS: IMPRESSION: Interval resolution of previously noted trace left parafalcine subdural hematoma. No acute intracranial process.  COGNITION: Overall cognitive status: Within functional limits for tasks assessed   SENSATION: WFL  COORDINATION: Heel slide: WFL RLE; slight decrease with LLE   MUSCLE TONE: WFL  POSTURE: rounded shoulders, forward head, and posterior pelvic tilt  LOWER EXTREMITY ROM:     Active  Right Eval Left Eval  Hip flexion    Hip extension    Hip abduction    Hip adduction    Hip internal rotation    Hip external rotation    Knee flexion    Knee extension    Ankle  dorsiflexion  -3  Ankle plantarflexion  35  Ankle inversion  10  Ankle eversion  8   (Blank rows = not tested)  LOWER EXTREMITY MMT:    MMT Right Eval Left Eval  Hip flexion 4 4  Hip extension  Hip abduction 3 3  Hip adduction 3+ 3+  Hip internal rotation    Hip external rotation    Knee flexion 4- 4-  Knee extension 4 4  Ankle dorsiflexion 3 2+  Ankle plantarflexion 3+ 3  Ankle inversion    Ankle eversion    (Blank rows = not tested)  BED MOBILITY:  Not tested  TRANSFERS: Sit to stand: CGA  Assistive device utilized: None     Stand to sit: CGA  Assistive device utilized: None     Chair to chair: CGA  Assistive device utilized: None       RAMP:  Not tested  CURB:  Findings: needs UE support to step up  STAIRS: heavy BUE support step over step negotiation.  GAIT: Findings: Gait Characteristics: decreased stride length, Left foot flat, wide BOS, and poor foot clearance- Left, Distance walked: 60 ft, Assistive device utilized:None, Level of assistance: CGA, and Comments: increased L ankle rolling due to not wearing AFO  FUNCTIONAL TESTS:  5 times sit to stand: 30.53 with 4 posterior LOB 6 minute walk test: perform next session  Berg Balance Scale: 26  PATIENT SURVEYS:  LEFS give next session                                                                                                                               TREATMENT DATE: 01/26/24    TherEx 10x STS BTB hamstring curl 15x each LE Seated LAQ 4lb ankle weight; 10x each LE ;2 sets  Seated march 4lb ankle weights: 10x each LE; 2 sets   Neuro Re-ed: SLS 30 seconds x 2 trials Standing with CGA next to support surface:  Airex pad: static stand 30 seconds x 2 trials, noticeable trembling of ankles/LE's with fatigue and challenge to maintain stability Airex pad: horizontal head turnsscanning room 10x ; cueing for arc of motion  Airex pad: vertical head turns 30 seconds, cueing for arc of motion,  noticeable sway with upward gaze increasing demand on ankle righting reaction musculature Airex pad: one foot on 6" step one foot on airex pad, hold position for 30 seconds, switch legs, 2x each LE; Airex pad march 10x each LE; very challenging.  Airex pad toe taps 10x each LE   Multiple prolonged rest breaks required due to BLE fatigue and SOB. Pt was able to recover with seated rest breaks without hydration. Supervision assist from PT for safety for all standing interventions. Some hip pain noted in L hip throughout session.   PATIENT EDUCATION: Education details: goals, POC, HEP Person educated: Patient Education method: Explanation, Demonstration, Tactile cues, Verbal cues, and Handouts Education comprehension: verbalized understanding, returned demonstration, verbal cues required, tactile cues required, and needs further education  HOME EXERCISE PROGRAM: Access Code: [email or web address] URL: [email or web address] Date: 12/29/2023 Prepared by: Zabdi Mis  Exercises - Leg Extension  - 1 x daily - 7 x weekly - 2 sets - 10 reps - 5 hold -  Seated March  - 1 x daily - 7 x weekly - 2 sets - 10 reps - 5 hold - Seated Heel Raise  - 1 x daily - 7 x weekly - 2 sets - 10 reps - 5 hold  GOALS: Goals reviewed with patient? Yes  SHORT TERM GOALS: Target date: 01/26/2024    Patient will be independent in home exercise program to improve strength/mobility for better functional independence with ADLs.  Baseline: Goal status: INITIAL  LONG TERM GOALS: Target date: 03/22/2024    Patient (< 62 years old) will complete five times sit to stand test in < 10 seconds indicating an increased LE strength and improved balance.  Baseline: 30.53 with 4 posterior LOB Goal status: INITIAL  2.   Patient will increase Berg Balance score by >45/56 to demonstrate decreased fall risk during functional activities.  Baseline: 5/5: 26  Goal status: INITIAL  3.    Patient will increase six minute walk  test distance to >1200 for progression to age norm community ambulator and improve gait ability  Baseline: perform next session 5/12: 710 ft  Goal status: INITIAL  4.  Patient will score > 60/80 on LEFS for improved functional mobility and quality of life.  Baseline: perform next session 5/12: 43 Goal status: INITIAL    ASSESSMENT:  CLINICAL IMPRESSION: Patient performs sit to stands with decreased posterior LOB. He has improved ankle righting reactions throughout session. He has excellent motivation throughout session and is eager to progress his mobility.  Patient will benefit from skilled physical therapy to improve strength, mobility, and stability.   OBJECTIVE IMPAIRMENTS: Abnormal gait, cardiopulmonary status limiting activity, decreased activity tolerance, decreased balance, decreased coordination, decreased endurance, decreased knowledge of use of DME, decreased mobility, difficulty walking, decreased ROM, decreased strength, dizziness, impaired perceived functional ability, impaired flexibility, impaired vision/preception, improper body mechanics, postural dysfunction, and obesity.   ACTIVITY LIMITATIONS: carrying, lifting, bending, sitting, standing, squatting, stairs, transfers, bed mobility, toileting, dressing, reach over head, hygiene/grooming, locomotion level, and caring for others  PARTICIPATION LIMITATIONS: meal prep, cleaning, laundry, driving, shopping, community activity, occupation, and yard work  PERSONAL FACTORS: Age, Behavior pattern, Fitness, Past/current experiences, Time since onset of injury/illness/exacerbation, Transportation, and 3+ comorbidities: DM, HTN, Anemia, Bells palsy, Hypothyroidism, Kidney stones, stroke and pseudotumour cerebri. Macular degeneration with impaired visual acuity  are also affecting patient's functional outcome.   REHAB POTENTIAL: Good  CLINICAL DECISION MAKING: Evolving/moderate complexity  EVALUATION COMPLEXITY:  Moderate  PLAN:  PT FREQUENCY: 2x/week  PT DURATION: 12 weeks  PLANNED INTERVENTIONS: 97164- PT Re-evaluation, 97750- Physical Performance Testing, 97110-Therapeutic exercises, 97530- Therapeutic activity, [id number]- Neuromuscular re-education, 97535- Self Care, [id number]- Manual therapy, [id number]- Gait training, [id number]- Orthotic Initial, [id number]- Orthotic/Prosthetic subsequent, [phone]- Canalith repositioning, [id number]- Splinting, Y7829- Electrical stimulation (unattended), [phone]- Electrical stimulation (manual), [id number]- Vasopneumatic device, [id number]- Ultrasound, [id number]- Traction (mechanical), [id number]- Ionotophoresis 4mg /ml Dexamethasone , Patient/Family education, Balance training, Stair training, Taping, Dry Needling, Joint mobilization, Spinal mobilization, Scar mobilization, Compression bandaging, Vestibular training, Visual/preceptual remediation/compensation, Cognitive remediation, DME instructions, Cryotherapy, Moist heat, and Biofeedback  PLAN FOR NEXT SESSION:   balance, strength, L ankle strength, stairs    Elison Worrel, PT 01/26/2024, 10:15 AM

## 2024-01-23 NOTE — Telephone Encounter (Signed)
 Spoke with pt to ask him what symptoms he was having and he stated "that is basically it just pain in the left hip". Pt would like to schedule an appointment but can only be seen on Mondays, Wednesdays, and Fridays. Would it be okay to schedule pt for next Friday the 6th at 11 am?

## 2024-01-23 NOTE — Telephone Encounter (Signed)
 Ok to schedule for 01/30/24.

## 2024-01-26 ENCOUNTER — Ambulatory Visit: Payer: Medicare Other | Attending: Internal Medicine

## 2024-01-26 DIAGNOSIS — R2681 Unsteadiness on feet: Secondary | ICD-10-CM | POA: Insufficient documentation

## 2024-01-26 DIAGNOSIS — R269 Unspecified abnormalities of gait and mobility: Secondary | ICD-10-CM | POA: Diagnosis present

## 2024-01-26 DIAGNOSIS — R262 Difficulty in walking, not elsewhere classified: Secondary | ICD-10-CM | POA: Insufficient documentation

## 2024-01-26 DIAGNOSIS — R2689 Other abnormalities of gait and mobility: Secondary | ICD-10-CM | POA: Diagnosis present

## 2024-01-26 DIAGNOSIS — M6281 Muscle weakness (generalized): Secondary | ICD-10-CM | POA: Insufficient documentation

## 2024-01-26 DIAGNOSIS — R278 Other lack of coordination: Secondary | ICD-10-CM | POA: Insufficient documentation

## 2024-01-26 NOTE — Telephone Encounter (Signed)
Per chart, patient scheduled.

## 2024-01-26 NOTE — Telephone Encounter (Signed)
 LM to schedule

## 2024-01-28 ENCOUNTER — Ambulatory Visit: Payer: Medicare Other

## 2024-01-29 NOTE — Therapy (Signed)
 OUTPATIENT PHYSICAL THERAPY NEURO TREATMENT   Patient Name: Gregory Crane MRN: [id number] DOB:Aug 02, 1971, 53 y.o., male Today's Date: 02/02/2024   PCP: Dellar Fenton  REFERRING PROVIDER: Dellar Fenton  END OF SESSION:  PT End of Session - 02/02/24 0927     Visit Number 6    Number of Visits 24    Date for PT Re-Evaluation 03/22/24    PT Start Time 0930    PT Stop Time 1014    PT Time Calculation (min) 44 min    Equipment Utilized During Treatment Gait belt    Activity Tolerance Patient tolerated treatment well    Behavior During Therapy [facility] for tasks assessed/performed                 Past Medical History:  Diagnosis Date   Allergy    Anemia    Bell's palsy    Diabetes mellitus without complication (HCC)    diet controlled   Fall 07/2023   Hypertension    Hypothyroidism    Kidney stones    Pseudotumor cerebri    Stroke [facility])    Past Surgical History:  Procedure Laterality Date   COLONOSCOPY WITH PROPOFOL  N/A 03/30/2020   Procedure: COLONOSCOPY WITH PROPOFOL ;  Surgeon: Shane Darling, MD;  Location: [facility] ENDOSCOPY;  Service: Endoscopy;  Laterality: N/A;   LOOP RECORDER INSERTION N/A 01/27/2018   Procedure: LOOP RECORDER INSERTION;  Surgeon: Verona Goodwill, MD;  Location: [facility] INVASIVE CV LAB;  Service: Cardiovascular;  Laterality: N/A;   LUMBAR PUNCTURE     as child   NO PAST SURGERIES     REMOVAL OF A DIALYSIS CATHETER     TEE WITHOUT CARDIOVERSION N/A 01/07/2018   Procedure: TRANSESOPHAGEAL ECHOCARDIOGRAM (TEE);  Surgeon: Devorah Fonder, MD;  Location: [facility] ORS;  Service: Cardiovascular;  Laterality: N/A;   Patient Active Problem List   Diagnosis Date Noted   Left hip pain 01/30/2024   Acute on chronic heart failure with preserved ejection fraction (HFpEF) (HCC) 12/26/2023   Community acquired pneumonia 12/24/2023   Acute respiratory failure with hypoxia (HCC) 12/23/2023   SVT (supraventricular tachycardia) (HCC) 08/10/2023   Atrial  fibrillation with rapid ventricular response (HCC) 07/03/2023   Atrial fibrillation with RVR (HCC) 07/02/2023   Fall 06/15/2023   Non-compliance with renal dialysis (HCC) 05/02/2023   Obesity (BMI 30-39.9) 04/30/2023   Volume overload 04/29/2023   Renal osteodystrophy 11/25/2022   Unsteady gait 10/05/2022   Steal syndrome of dialysis vascular access (HCC) 05/28/2022   Hydronephrosis, left 05/04/2022   Leukocytosis 05/04/2022   Chest pain 01/13/2022   Diabetic retinopathy associated with diabetes mellitus due to underlying condition (HCC) 01/13/2022   ESRD on hemodialysis (HCC) 01/13/2022   Deafness in right ear 08/11/2021   Open wound 01/25/2021   History of colon polyps 10/15/2020   Acute cholecystitis without calculus 09/09/2020   Type 2 diabetes mellitus, with long-term current use of insulin  (HCC) 09/09/2020   Cryptogenic stroke (HCC) 07/13/2020   History of loop recorder 07/13/2020   Elevated troponin 04/03/2020   Acquired trigger finger 06/08/2019   Lymphedema 06/08/2019   Anemia 04/17/2019   Swelling of both lower extremities 01/10/2019   Facial droop 04/09/2018   Daytime somnolence 03/30/2018   Carotid artery disease (HCC) 02/03/2018   Intracranial vascular stenosis 09/12/2017   Cough 01/20/2017   Bell's palsy 11/10/2016   History of CVA (cerebrovascular accident) 11/10/2016   Benign localized hyperplasia of prostate with urinary obstruction 10/27/2016   History of nephrolithiasis  10/27/2016   TIA (transient ischemic attack) 10/20/2016   Near syncope 06/23/2016   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   Neuropathy 08/06/2015   Health care maintenance 08/06/2015   Hypertension 08/06/2015   Hypothyroidism 10/10/2013   Microalbuminuria 10/10/2013   Hyperlipidemia 10/10/2013   B12 deficiency 10/10/2013   Environmental allergies 07/04/2013    ONSET DATE: 3 years ago  REFERRING DIAG: unsteady gait  THERAPY DIAG:  Muscle weakness (generalized)  Abnormality of gait and  mobility  Unsteadiness on feet  Other abnormalities of gait and mobility  Rationale for Evaluation and Treatment: Rehabilitation  SUBJECTIVE:                                                                                                                                                                                             SUBJECTIVE STATEMENT: Patient reports he is doing well today.   Patient did have two half mile walks this week.   Pt accompanied by: self  PERTINENT HISTORY: Patient returning to PT s/p hospitalization for pneumonia, , SVT, ESRD on hypodialysis. PMH DM, HTN, Anemia, Bells palsy, Hypothyroidism, Kidney stones, stroke and pseudotumour cerebri. Macular degeneration with impaired visual acuity   PAIN:  Are you having pain? Some L foot numbness/neuropathy  PRECAUTIONS: Fall  RED FLAGS: None   WEIGHT BEARING RESTRICTIONS: No  FALLS: Has patient fallen in last 6 months? No  LIVING ENVIRONMENT: Lives with: lives alone Lives in: House/apartment Stairs: yes stairs in house Has following equipment at home: Single point cane  PLOF: Independent with basic ADLs  PATIENT GOALS: balance, strength  OBJECTIVE:  Note: Objective measures were completed at Evaluation unless otherwise noted.  DIAGNOSTIC FINDINGS: IMPRESSION: Interval resolution of previously noted trace left parafalcine subdural hematoma. No acute intracranial process.  COGNITION: Overall cognitive status: Within functional limits for tasks assessed   SENSATION: WFL  COORDINATION: Heel slide: WFL RLE; slight decrease with LLE   MUSCLE TONE: WFL  POSTURE: rounded shoulders, forward head, and posterior pelvic tilt  LOWER EXTREMITY ROM:     Active  Right Eval Left Eval  Hip flexion    Hip extension    Hip abduction    Hip adduction    Hip internal rotation    Hip external rotation    Knee flexion    Knee extension    Ankle dorsiflexion  -3  Ankle plantarflexion  35  Ankle  inversion  10  Ankle eversion  8   (Blank rows = not tested)  LOWER EXTREMITY MMT:    MMT Right Eval Left Eval  Hip flexion 4 4  Hip extension    Hip abduction 3  3  Hip adduction 3+ 3+  Hip internal rotation    Hip external rotation    Knee flexion 4- 4-  Knee extension 4 4  Ankle dorsiflexion 3 2+  Ankle plantarflexion 3+ 3  Ankle inversion    Ankle eversion    (Blank rows = not tested)  BED MOBILITY:  Not tested  TRANSFERS: Sit to stand: CGA  Assistive device utilized: None     Stand to sit: CGA  Assistive device utilized: None     Chair to chair: CGA  Assistive device utilized: None       RAMP:  Not tested  CURB:  Findings: needs UE support to step up  STAIRS: heavy BUE support step over step negotiation.  GAIT: Findings: Gait Characteristics: decreased stride length, Left foot flat, wide BOS, and poor foot clearance- Left, Distance walked: 60 ft, Assistive device utilized:None, Level of assistance: CGA, and Comments: increased L ankle rolling due to not wearing AFO  FUNCTIONAL TESTS:  5 times sit to stand: 30.53 with 4 posterior LOB 6 minute walk test: perform next session  Berg Balance Scale: 26  PATIENT SURVEYS:  LEFS give next session                                                                                                                               TREATMENT DATE: 02/02/24  TherAct:  Sit to stands with airex pad under feet: 10x; close CGA due to posterior LOB ; initially requires use of Ue's due to instability.   6" step: -one foot on step, eccentric posterior lowering of back leg to ground; 10x each LE; UE support ; 2 sets each LE   Cone on the ground : rainbow over cone 10x each LE   TherEx: 4 way ankle: RTB LLE  5lb AW seated: -LAQ 10x each LE  -march 10x each LE ;2 sets  RTB hamstring curl 10x each LE  Straight leg abduction adduction 10x each LE  Adduction ball squeeze 10x   Neuro Re-ed:  SLS 30 seconds x 2  trials Standing with CGA next to support surface:  Airex pad: static stand 30 seconds x 2 trials, noticeable trembling of ankles/LE's with fatigue and challenge to maintain stability Airex pad: horizontal head turnsscanning room 10x ; cueing for arc of motion  Airex pad: vertical head turns 30 seconds, cueing for arc of motion, noticeable sway with upward gaze increasing demand on ankle righting reaction musculature Airex pad: one foot on 6" step one foot on airex pad, hold position for 30 seconds, switch legs, 2x each LE; Airex pad toe taps 10x each LE with 6" step   BAPS board: LL -static hold 30 seconds;  -30 seconds df/pf ; 2 sets -30 seconds inversion/eversion 2 sets -challenging with brace  -circles / 30 seconds each direction   PATIENT EDUCATION: Education details: goals, POC, HEP Person educated: Patient Education method: Explanation, Demonstration, Tactile cues, Verbal cues, and  Handouts Education comprehension: verbalized understanding, returned demonstration, verbal cues required, tactile cues required, and needs further education  HOME EXERCISE PROGRAM: Access Code: [email or web address] URL: [email or web address] Date: 12/29/2023 Prepared by: Deneice Wack  Exercises - Leg Extension  - 1 x daily - 7 x weekly - 2 sets - 10 reps - 5 hold - Seated March  - 1 x daily - 7 x weekly - 2 sets - 10 reps - 5 hold - Seated Heel Raise  - 1 x daily - 7 x weekly - 2 sets - 10 reps - 5 hold  GOALS: Goals reviewed with patient? Yes  SHORT TERM GOALS: Target date: 01/26/2024    Patient will be independent in home exercise program to improve strength/mobility for better functional independence with ADLs.  Baseline: Goal status: INITIAL  LONG TERM GOALS: Target date: 03/22/2024    Patient (< 22 years old) will complete five times sit to stand test in < 10 seconds indicating an increased LE strength and improved balance.  Baseline: 30.53 with 4 posterior LOB Goal status:  INITIAL  2.   Patient will increase Berg Balance score by >45/56 to demonstrate decreased fall risk during functional activities.  Baseline: 5/5: 26  Goal status: INITIAL  3.    Patient will increase six minute walk test distance to >1200 for progression to age norm community ambulator and improve gait ability  Baseline: perform next session 5/12: 710 ft  Goal status: INITIAL  4.  Patient will score > 60/80 on LEFS for improved functional mobility and quality of life.  Baseline: perform next session 5/12: 43 Goal status: INITIAL    ASSESSMENT:  CLINICAL IMPRESSION: Patient has increased ankle righting reactions with use of airex pad under feet during sit to stands, regressing his ability to perform full movement without UE support. He requires heavily initially on Ue's then transitions to decreased assistance. Quad strengthening of eccentric control intervention tolerated but is fatiguing with patient reporting challenge.  LLE fatigues quicker than his RLE.  Patient will benefit from skilled physical therapy to improve strength, mobility, and stability.   OBJECTIVE IMPAIRMENTS: Abnormal gait, cardiopulmonary status limiting activity, decreased activity tolerance, decreased balance, decreased coordination, decreased endurance, decreased knowledge of use of DME, decreased mobility, difficulty walking, decreased ROM, decreased strength, dizziness, impaired perceived functional ability, impaired flexibility, impaired vision/preception, improper body mechanics, postural dysfunction, and obesity.   ACTIVITY LIMITATIONS: carrying, lifting, bending, sitting, standing, squatting, stairs, transfers, bed mobility, toileting, dressing, reach over head, hygiene/grooming, locomotion level, and caring for others  PARTICIPATION LIMITATIONS: meal prep, cleaning, laundry, driving, shopping, community activity, occupation, and yard work  PERSONAL FACTORS: Age, Behavior pattern, Fitness, Past/current  experiences, Time since onset of injury/illness/exacerbation, Transportation, and 3+ comorbidities: DM, HTN, Anemia, Bells palsy, Hypothyroidism, Kidney stones, stroke and pseudotumour cerebri. Macular degeneration with impaired visual acuity  are also affecting patient's functional outcome.   REHAB POTENTIAL: Good  CLINICAL DECISION MAKING: Evolving/moderate complexity  EVALUATION COMPLEXITY: Moderate  PLAN:  PT FREQUENCY: 2x/week  PT DURATION: 12 weeks  PLANNED INTERVENTIONS: 97164- PT Re-evaluation, 97750- Physical Performance Testing, 97110-Therapeutic exercises, 97530- Therapeutic activity, [id number]- Neuromuscular re-education, 97535- Self Care, [id number]- Manual therapy, [id number]- Gait training, [phone]- Orthotic Initial, [phone]- Orthotic/Prosthetic subsequent, [phone]- Canalith repositioning, [id number]- Splinting, [id number]- Electrical stimulation (unattended), [id number]- Electrical stimulation (manual), [id number]- Vasopneumatic device, [id number]- Ultrasound, [id number]- Traction (mechanical), [id number]- Ionotophoresis 4mg /ml Dexamethasone , Patient/Family education, Balance training, Stair training, Taping, Dry Needling, Joint mobilization, Spinal mobilization, Scar mobilization, Compression bandaging, Vestibular training, Visual/preceptual remediation/compensation, Cognitive remediation, DME instructions,  Cryotherapy, Moist heat, and Biofeedback  PLAN FOR NEXT SESSION:   balance, strength, L ankle strength, stairs    Charan Prieto, PT 02/02/2024, 10:16 AM

## 2024-01-30 ENCOUNTER — Ambulatory Visit (INDEPENDENT_AMBULATORY_CARE_PROVIDER_SITE_OTHER): Admitting: Internal Medicine

## 2024-01-30 ENCOUNTER — Ambulatory Visit (INDEPENDENT_AMBULATORY_CARE_PROVIDER_SITE_OTHER)

## 2024-01-30 VITALS — BP 128/74 | HR 83 | Temp 98.1°F | Resp 16 | Ht 72.0 in | Wt 254.8 lb

## 2024-01-30 DIAGNOSIS — Z992 Dependence on renal dialysis: Secondary | ICD-10-CM

## 2024-01-30 DIAGNOSIS — Z794 Long term (current) use of insulin: Secondary | ICD-10-CM

## 2024-01-30 DIAGNOSIS — E039 Hypothyroidism, unspecified: Secondary | ICD-10-CM | POA: Diagnosis not present

## 2024-01-30 DIAGNOSIS — I10 Essential (primary) hypertension: Secondary | ICD-10-CM

## 2024-01-30 DIAGNOSIS — M25552 Pain in left hip: Secondary | ICD-10-CM | POA: Insufficient documentation

## 2024-01-30 DIAGNOSIS — N186 End stage renal disease: Secondary | ICD-10-CM | POA: Diagnosis not present

## 2024-01-30 DIAGNOSIS — E1122 Type 2 diabetes mellitus with diabetic chronic kidney disease: Secondary | ICD-10-CM

## 2024-01-30 MED ORDER — PREGABALIN 50 MG PO CAPS: ORAL_CAPSULE | ORAL | 1 refills | Status: DC

## 2024-01-30 NOTE — Progress Notes (Signed)
 Subjective:    Patient ID: Gregory Crane, male    DOB: 1971-02-13, 53 y.o.   MRN: [id number]  Patient here for  Chief Complaint  Patient presents with   left hip pain    HPI Here for work in appt - work in with left hip pain. Has been going to PT to work on his balance and gait. Does feel is helping. Has been having increased pain - left hip - lateral left hip extending to the top of his left thigh. No radiation down his leg. No numbness or tingling. Is aggravated by walking, stairs. In working with PT, they were questioning if he could be adjusting to the brace. He reports that over the last couple of days, the hip is better. Pain is much better. Breathing stable. No sob. Going to dialysis two days per week. They are following labs.    Past Medical History:  Diagnosis Date   Allergy    Anemia    Bell's palsy    Diabetes mellitus without complication (HCC)    diet controlled   Fall 07/2023   Hypertension    Hypothyroidism    Kidney stones    Pseudotumor cerebri    Stroke [facility])    Past Surgical History:  Procedure Laterality Date   COLONOSCOPY WITH PROPOFOL  N/A 03/30/2020   Procedure: COLONOSCOPY WITH PROPOFOL ;  Surgeon: Shane Darling, MD;  Location: [facility] ENDOSCOPY;  Service: Endoscopy;  Laterality: N/A;   LOOP RECORDER INSERTION N/A 01/27/2018   Procedure: LOOP RECORDER INSERTION;  Surgeon: Verona Goodwill, MD;  Location: [facility] INVASIVE CV LAB;  Service: Cardiovascular;  Laterality: N/A;   LUMBAR PUNCTURE     as child   NO PAST SURGERIES     REMOVAL OF A DIALYSIS CATHETER     TEE WITHOUT CARDIOVERSION N/A 01/07/2018   Procedure: TRANSESOPHAGEAL ECHOCARDIOGRAM (TEE);  Surgeon: Devorah Fonder, MD;  Location: [facility] ORS;  Service: Cardiovascular;  Laterality: N/A;   Family History  Problem Relation Age of Onset   Breast cancer Mother    Diabetes Father    Diabetes Sister    Arthritis Maternal Grandmother    Diabetes Maternal Grandmother    Social History    Socioeconomic History   Marital status: Widowed    Spouse name: Cathleen Coach   Number of children: 0   Years of education: Not on file   Highest education level: Not on file  Occupational History   Occupation: disability  Tobacco Use   Smoking status: Never   Smokeless tobacco: Never  Vaping Use   Vaping status: Never Used  Substance and Sexual Activity   Alcohol use: No    Comment: last use early 2023   Drug use: No   Sexual activity: Yes  Other Topics Concern   Not on file  Social History Narrative   Wife deceased 05/09/20, has 2 step children, independent at baseline.   Social Drivers of Health   Financial Resource Strain: Medium Risk (09/12/2023)   Received from [facility]   Overall Financial Resource Strain (CARDIA)    Difficulty of Paying Living Expenses: Somewhat hard  Food Insecurity: No Food Insecurity (12/24/2023)   Hunger Vital Sign    Worried About Running Out of Food in the Last Year: Never true    Ran Out of Food in the Last Year: Never true  Transportation Needs: No Transportation Needs (12/24/2023)   PRAPARE - Transportation    Lack of Transportation (Medical): No    Lack of  Transportation (Non-Medical): No  Physical Activity: Insufficiently Active (06/25/2023)   Exercise Vital Sign    Days of Exercise per Week: 2 days    Minutes of Exercise per Session: 40 min  Stress: No Stress Concern Present (07/09/2023)   Received from [facility] - Occupational Stress Questionnaire    Feeling of Stress : Not at all  Social Connections: Moderately Isolated (06/25/2023)   Social Connection and Isolation Panel [NHANES]    Frequency of Communication with Friends and Family: More than three times a week    Frequency of Social Gatherings with Friends and Family: Twice a week    Attends Religious Services: More than 4 times per year    Active Member of [employer] or Organizations: No    Attends Banker Meetings: Never     Marital Status: Widowed     Review of Systems  Constitutional:  Negative for appetite change and unexpected weight change.  HENT:  Negative for congestion and sinus pressure.   Respiratory:  Negative for cough, chest tightness and shortness of breath.   Cardiovascular:  Negative for chest pain and palpitations.       Leg selling - erall improved.   Gastrointestinal:  Negative for abdominal pain and vomiting.       Some loose stool the last couple of days. Has not been a persistent issue.   Genitourinary:  Negative for difficulty urinating and dysuria.  Musculoskeletal:  Negative for joint swelling and myalgias.  Skin:  Negative for color change and rash.  Neurological:  Negative for dizziness and headaches.  Psychiatric/Behavioral:  Negative for agitation and dysphoric mood.        Reports increased stress this past week. May be selling his house to get a one story.        Objective:     BP 128/74   Pulse 83   Temp 98.1 F (36.7 C)   Resp 16   Ht 6' (1.829 m)   Wt 254 lb 12.8 oz (115.6 kg)   SpO2 98%   BMI 34.56 kg/m  Wt Readings from Last 3 Encounters:  01/30/24 254 lb 12.8 oz (115.6 kg)  12/31/23 258 lb 6.4 oz (117.2 kg)  12/26/23 248 lb 7.3 oz (112.7 kg)    Physical Exam Vitals reviewed.  Constitutional:      General: He is not in acute distress.    Appearance: Normal appearance. He is well-developed.  HENT:     Head: Normocephalic and atraumatic.     Right Ear: External ear normal.     Left Ear: External ear normal.     Mouth/Throat:     Pharynx: No oropharyngeal exudate or posterior oropharyngeal erythema.  Eyes:     General: No scleral icterus.       Right eye: No discharge.        Left eye: No discharge.     Conjunctiva/sclera: Conjunctivae normal.  Cardiovascular:     Rate and Rhythm: Normal rate and regular rhythm.  Pulmonary:     Effort: Pulmonary effort is normal. No respiratory distress.     Breath sounds: Normal breath sounds.  Abdominal:      General: Bowel sounds are normal.     Palpations: Abdomen is soft.     Tenderness: There is no abdominal tenderness.  Musculoskeletal:     Cervical back: Neck supple. No tenderness.     Comments: No pain with abduction/adduction - lower legs. Negative SLR. Some  minimal pulling with rotation of the leg.   Lymphadenopathy:     Cervical: No cervical adenopathy.  Skin:    Findings: No erythema or rash.  Neurological:     Mental Status: He is alert.  Psychiatric:        Mood and Affect: Mood normal.        Behavior: Behavior normal.         Outpatient Encounter Medications as of 01/30/2024  Medication Sig   pregabalin  (LYRICA ) 50 MG capsule 1 -2 tablets q hs   amiodarone  (PACERONE ) 200 MG tablet Take 1 tablet (200 mg total) by mouth 2 (two) times daily.   apixaban  (ELIQUIS ) 5 MG TABS tablet Take 1 tablet (5 mg total) by mouth 2 (two) times daily.   diltiazem  (CARDIZEM  CD) 120 MG 24 hr capsule Take 1 capsule (120 mg total) by mouth daily.   dorzolamide-timolol (COSOPT) 2-0.5 % ophthalmic solution Place 1 drop into both eyes 2 (two) times daily.   furosemide  (LASIX ) 40 MG tablet Take 1 tablet (40 mg total) by mouth 2 (two) times daily.   hydrALAZINE  (APRESOLINE ) 25 MG tablet Take 25 mg by mouth 2 (two) times daily.   levothyroxine  (SYNTHROID ) 175 MCG tablet TAKE 1 TABLET (175 MCG) BY MOUTH ONCE DAILY BEFORE BREAKFAST.   metoprolol  tartrate 37.5 MG TABS Take 1 tablet (37.5 mg total) by mouth 2 (two) times daily.   RENVELA  800 MG tablet Take 800 mg by mouth 3 (three) times daily.   rosuvastatin  (CRESTOR ) 5 MG tablet Take 5 mg by mouth daily.   [DISCONTINUED] pregabalin  (LYRICA ) 100 MG capsule Take 1 capsule (100 mg total) by mouth daily.   No facility-administered encounter medications on file as of 01/30/2024.     Lab Results  Component Value Date   WBC 5.3 12/26/2023   HGB 11.3 (L) 12/26/2023   HCT 33.1 (L) 12/26/2023   PLT 168 12/26/2023   GLUCOSE 120 (H) 12/26/2023   CHOL  129 10/09/2023   TRIG 146.0 10/09/2023   HDL 34.10 (L) 10/09/2023   LDLDIRECT 107.0 06/26/2021   LDLCALC 66 10/09/2023   ALT 13 12/24/2023   AST 16 12/24/2023   NA 136 12/26/2023   K 3.9 12/26/2023   CL 97 (L) 12/26/2023   CREATININE 7.01 (H) 12/26/2023   BUN 59 (H) 12/26/2023   CO2 23 12/26/2023   TSH 11.24 (H) 10/09/2023   PSA 0.48 10/04/2022   INR 1.3 (H) 08/05/2023   HGBA1C 6.2 (H) 12/24/2023    ECHOCARDIOGRAM COMPLETE Result Date: 12/25/2023    ECHOCARDIOGRAM REPORT   Patient Name:   [facility] Date of Exam: 12/25/2023 Medical Rec #:  [id number]      Height:       72.0 in Accession #:    [id number]     Weight:       252.9 lb Date of Birth:  1970-09-05     BSA:          2.354 m Patient Age:    52 years       BP:           143/82 mmHg Patient Gender: M              HR:           60 bpm. Exam Location:  [facility] Procedure: 2D Echo, Cardiac Doppler and Color Doppler (Both Spectral and Color            Flow Doppler were utilized during procedure).  Indications:     Acute respiratory distress R06.03  History:         Patient has prior history of Echocardiogram examinations, most                  recent 07/02/2023. Stroke; Risk Factors:Diabetes and                  Hypertension.  Sonographer:     Broadus Canes Referring Phys:  [phone] CHRISTOPHER END Diagnosing Phys: Timothy Gollan MD IMPRESSIONS  1. Left ventricular ejection fraction, by estimation, is 60 to 65%. The left ventricle has normal function. The left ventricle has no regional wall motion abnormalities. There is mild left ventricular hypertrophy. Left ventricular diastolic parameters were normal.  2. Right ventricular systolic function is normal. The right ventricular size is normal.  3. The mitral valve is normal in structure. Mild mitral valve regurgitation. No evidence of mitral stenosis.  4. The aortic valve is tricuspid. There is mild calcification of the aortic valve. Aortic valve regurgitation is not visualized. Aortic valve sclerosis is  present, with no evidence of aortic valve stenosis.  5. The inferior vena cava is normal in size with greater than 50% respiratory variability, suggesting right atrial pressure of 3 mmHg. FINDINGS  Left Ventricle: Left ventricular ejection fraction, by estimation, is 60 to 65%. The left ventricle has normal function. The left ventricle has no regional wall motion abnormalities. Strain was performed and the global longitudinal strain is indeterminate. The left ventricular internal cavity size was normal in size. There is mild left ventricular hypertrophy. Left ventricular diastolic parameters were normal. Right Ventricle: The right ventricular size is normal. No increase in right ventricular wall thickness. Right ventricular systolic function is normal. Left Atrium: Left atrial size was normal in size. Right Atrium: Right atrial size was normal in size. Pericardium: There is no evidence of pericardial effusion. Mitral Valve: The mitral valve is normal in structure. Mild mitral valve regurgitation. No evidence of mitral valve stenosis. MV peak gradient, 8.0 mmHg. The mean mitral valve gradient is 3.0 mmHg. Tricuspid Valve: The tricuspid valve is normal in structure. Tricuspid valve regurgitation is not demonstrated. No evidence of tricuspid stenosis. Aortic Valve: The aortic valve is tricuspid. There is mild calcification of the aortic valve. Aortic valve regurgitation is not visualized. Aortic valve sclerosis is present, with no evidence of aortic valve stenosis. Aortic valve mean gradient measures 4.0 mmHg. Aortic valve peak gradient measures 6.9 mmHg. Aortic valve area, by VTI measures 2.51 cm. Pulmonic Valve: The pulmonic valve was normal in structure. Pulmonic valve regurgitation is not visualized. No evidence of pulmonic stenosis. Aorta: The aortic root is normal in size and structure. Venous: The inferior vena cava is normal in size with greater than 50% respiratory variability, suggesting right atrial  pressure of 3 mmHg. IAS/Shunts: No atrial level shunt detected by color flow Doppler. Additional Comments: 3D was performed not requiring image post processing on an independent workstation and was indeterminate.  LEFT VENTRICLE PLAX 2D LVIDd:         4.40 cm   Diastology LVIDs:         2.70 cm   LV e' medial:    6.09 cm/s LV PW:         1.20 cm   LV E/e' medial:  21.7 LV IVS:        1.60 cm   LV e' lateral:   6.85 cm/s LVOT diam:     2.20 cm  LV E/e' lateral: 19.3 LV SV:         81 LV SV Index:   34 LVOT Area:     3.80 cm  RIGHT VENTRICLE RV Basal diam:  3.70 cm RV Mid diam:    3.00 cm LEFT ATRIUM           Index        RIGHT ATRIUM           Index LA diam:      3.00 cm 1.27 cm/m   RA Area:     18.60 cm LA Vol (A2C): 54.5 ml 23.15 ml/m  RA Volume:   47.40 ml  20.14 ml/m LA Vol (A4C): 66.9 ml 28.42 ml/m  AORTIC VALVE AV Area (Vmax):    2.59 cm AV Area (Vmean):   2.30 cm AV Area (VTI):     2.51 cm AV Vmax:           131.00 cm/s AV Vmean:          97.100 cm/s AV VTI:            0.321 m AV Peak Grad:      6.9 mmHg AV Mean Grad:      4.0 mmHg LVOT Vmax:         89.40 cm/s LVOT Vmean:        58.700 cm/s LVOT VTI:          0.212 m LVOT/AV VTI ratio: 0.66  AORTA Ao Root diam: 2.80 cm MITRAL VALVE                TRICUSPID VALVE MV Area (PHT): 4.08 cm     TR Peak grad:   28.3 mmHg MV Area VTI:   2.65 cm     TR Vmax:        266.00 cm/s MV Peak grad:  8.0 mmHg MV Mean grad:  3.0 mmHg     SHUNTS MV Vmax:       1.41 m/s     Systemic VTI:  0.21 m MV Vmean:      73.3 cm/s    Systemic Diam: 2.20 cm MV Decel Time: 186 msec MV E velocity: 132.00 cm/s MV A velocity: 79.70 cm/s MV E/A ratio:  1.66 Belva Boyden MD Electronically signed by Belva Boyden MD Signature Date/Time: 12/25/2023/3:06:16 PM    Final        Assessment & Plan:  Left hip pain Assessment & Plan: Left hip pain as outlined. Better the last couple of days. Exam as outlined. No significant pain to day on exam. Given persistent pain, xray obtained.  Continue PT.   Orders: -     DG HIP UNILAT W OR W/O PELVIS 2-3 VIEWS LEFT; Future  ESRD on hemodialysis [facility]) Assessment & Plan: Continue hemodialysis.  Followed by nephrology. Receiving dialysis two days per week. They are following labs. Obtain. Add tsh to blood being drawn next week.    Primary hypertension Assessment & Plan: Blood pressure as outlined.  Continue hydralazine , cardizem  and metoprolol . Follow pressures.  Labs being followed by nephrology.    Acquired hypothyroidism Assessment & Plan: On thyroid  replacement. On amiodarone . Recheck tsh with labs next week through nephrology.    Type 2 diabetes mellitus with chronic kidney disease on chronic dialysis, with long-term current use of insulin  (HCC) Assessment & Plan: Low carb diet and exercise.  No change in medication. Continue f/u with endocrinology.    Other orders -     Pregabalin ;  1 -2 tablets q hs  Dispense: 60 capsule; Refill: 1     Dellar Fenton, MD

## 2024-01-31 ENCOUNTER — Ambulatory Visit: Payer: Self-pay | Admitting: Internal Medicine

## 2024-01-31 ENCOUNTER — Encounter: Payer: Self-pay | Admitting: Internal Medicine

## 2024-01-31 NOTE — Assessment & Plan Note (Signed)
 On thyroid  replacement. On amiodarone . Recheck tsh with labs next week through nephrology.

## 2024-01-31 NOTE — Assessment & Plan Note (Signed)
 Continue hemodialysis.  Followed by nephrology. Receiving dialysis two days per week. They are following labs. Obtain. Add tsh to blood being drawn next week.

## 2024-01-31 NOTE — Assessment & Plan Note (Signed)
 Low carb diet and exercise.  No change in medication. Continue f/u with endocrinology.

## 2024-01-31 NOTE — Assessment & Plan Note (Signed)
 Left hip pain as outlined. Better the last couple of days. Exam as outlined. No significant pain to day on exam. Given persistent pain, xray obtained. Continue PT.

## 2024-01-31 NOTE — Assessment & Plan Note (Signed)
 Blood pressure as outlined.  Continue hydralazine , cardizem  and metoprolol . Follow pressures.  Labs being followed by nephrology.

## 2024-02-02 ENCOUNTER — Ambulatory Visit: Payer: Medicare Other

## 2024-02-02 DIAGNOSIS — M6281 Muscle weakness (generalized): Secondary | ICD-10-CM | POA: Diagnosis not present

## 2024-02-02 DIAGNOSIS — R269 Unspecified abnormalities of gait and mobility: Secondary | ICD-10-CM

## 2024-02-02 DIAGNOSIS — R2681 Unsteadiness on feet: Secondary | ICD-10-CM

## 2024-02-02 DIAGNOSIS — R2689 Other abnormalities of gait and mobility: Secondary | ICD-10-CM

## 2024-02-03 NOTE — Therapy (Signed)
 OUTPATIENT PHYSICAL THERAPY NEURO TREATMENT   Patient Name: Gregory Crane MRN: [id number] DOB:29-Jan-1971, 53 y.o., male Today's Date: 02/04/2024   PCP: Dellar Fenton  REFERRING PROVIDER: Dellar Fenton  END OF SESSION:  PT End of Session - 02/04/24 1014     Visit Number 7    Number of Visits 24    Date for PT Re-Evaluation 03/22/24    PT Start Time 1015    PT Stop Time 1059    PT Time Calculation (min) 44 min    Equipment Utilized During Treatment Gait belt    Activity Tolerance Patient tolerated treatment well    Behavior During Therapy [facility] for tasks assessed/performed                  Past Medical History:  Diagnosis Date   Allergy    Anemia    Bell's palsy    Diabetes mellitus without complication (HCC)    diet controlled   Fall 07/2023   Hypertension    Hypothyroidism    Kidney stones    Pseudotumor cerebri    Stroke [facility])    Past Surgical History:  Procedure Laterality Date   COLONOSCOPY WITH PROPOFOL  N/A 03/30/2020   Procedure: COLONOSCOPY WITH PROPOFOL ;  Surgeon: Shane Darling, MD;  Location: [facility] ENDOSCOPY;  Service: Endoscopy;  Laterality: N/A;   LOOP RECORDER INSERTION N/A 01/27/2018   Procedure: LOOP RECORDER INSERTION;  Surgeon: Verona Goodwill, MD;  Location: [facility] INVASIVE CV LAB;  Service: Cardiovascular;  Laterality: N/A;   LUMBAR PUNCTURE     as child   NO PAST SURGERIES     REMOVAL OF A DIALYSIS CATHETER     TEE WITHOUT CARDIOVERSION N/A 01/07/2018   Procedure: TRANSESOPHAGEAL ECHOCARDIOGRAM (TEE);  Surgeon: Devorah Fonder, MD;  Location: [facility] ORS;  Service: Cardiovascular;  Laterality: N/A;   Patient Active Problem List   Diagnosis Date Noted   Left hip pain 01/30/2024   Acute on chronic heart failure with preserved ejection fraction (HFpEF) (HCC) 12/26/2023   Community acquired pneumonia 12/24/2023   Acute respiratory failure with hypoxia (HCC) 12/23/2023   SVT (supraventricular tachycardia) (HCC) 08/10/2023    Atrial fibrillation with rapid ventricular response (HCC) 07/03/2023   Atrial fibrillation with RVR (HCC) 07/02/2023   Fall 06/15/2023   Non-compliance with renal dialysis (HCC) 05/02/2023   Obesity (BMI 30-39.9) 04/30/2023   Volume overload 04/29/2023   Renal osteodystrophy 11/25/2022   Unsteady gait 10/05/2022   Steal syndrome of dialysis vascular access (HCC) 05/28/2022   Hydronephrosis, left 05/04/2022   Leukocytosis 05/04/2022   Chest pain 01/13/2022   Diabetic retinopathy associated with diabetes mellitus due to underlying condition (HCC) 01/13/2022   ESRD on hemodialysis (HCC) 01/13/2022   Deafness in right ear 08/11/2021   Open wound 01/25/2021   History of colon polyps 10/15/2020   Acute cholecystitis without calculus 09/09/2020   Type 2 diabetes mellitus, with long-term current use of insulin  (HCC) 09/09/2020   Cryptogenic stroke (HCC) 07/13/2020   History of loop recorder 07/13/2020   Elevated troponin 04/03/2020   Acquired trigger finger 06/08/2019   Lymphedema 06/08/2019   Anemia 04/17/2019   Swelling of both lower extremities 01/10/2019   Facial droop 04/09/2018   Daytime somnolence 03/30/2018   Carotid artery disease (HCC) 02/03/2018   Intracranial vascular stenosis 09/12/2017   Cough 01/20/2017   Bell's palsy 11/10/2016   History of CVA (cerebrovascular accident) 11/10/2016   Benign localized hyperplasia of prostate with urinary obstruction 10/27/2016   History of  nephrolithiasis 10/27/2016   TIA (transient ischemic attack) 10/20/2016   Near syncope 06/23/2016   Organic impotence 10/01/2015   Neuropathy 08/06/2015   Health care maintenance 08/06/2015   Hypertension 08/06/2015   Hypothyroidism 10/10/2013   Microalbuminuria 10/10/2013   Hyperlipidemia 10/10/2013   B12 deficiency 10/10/2013   Environmental allergies 07/04/2013    ONSET DATE: 3 years ago  REFERRING DIAG: unsteady gait  THERAPY DIAG:  Muscle weakness (generalized)  Abnormality of gait  and mobility  Unsteadiness on feet  Other abnormalities of gait and mobility  Rationale for Evaluation and Treatment: Rehabilitation  SUBJECTIVE:                                                                                                                                                                                             SUBJECTIVE STATEMENT: Patient reports he was sore after last session. Is doing well with no LOB or falls since last session. Has his car back now  Pt accompanied by: self  PERTINENT HISTORY: Patient returning to PT s/p hospitalization for pneumonia, , SVT, ESRD on hypodialysis. PMH DM, HTN, Anemia, Bells palsy, Hypothyroidism, Kidney stones, stroke and pseudotumour cerebri. Macular degeneration with impaired visual acuity   PAIN:  Are you having pain? Some L foot numbness/neuropathy  PRECAUTIONS: Fall  RED FLAGS: None   WEIGHT BEARING RESTRICTIONS: No  FALLS: Has patient fallen in last 6 months? No  LIVING ENVIRONMENT: Lives with: lives alone Lives in: House/apartment Stairs: yes stairs in house Has following equipment at home: Single point cane  PLOF: Independent with basic ADLs  PATIENT GOALS: balance, strength  OBJECTIVE:  Note: Objective measures were completed at Evaluation unless otherwise noted.  DIAGNOSTIC FINDINGS: IMPRESSION: Interval resolution of previously noted trace left parafalcine subdural hematoma. No acute intracranial process.  COGNITION: Overall cognitive status: Within functional limits for tasks assessed   SENSATION: WFL  COORDINATION: Heel slide: WFL RLE; slight decrease with LLE   MUSCLE TONE: WFL  POSTURE: rounded shoulders, forward head, and posterior pelvic tilt  LOWER EXTREMITY ROM:     Active  Right Eval Left Eval  Hip flexion    Hip extension    Hip abduction    Hip adduction    Hip internal rotation    Hip external rotation    Knee flexion    Knee extension    Ankle dorsiflexion  -3   Ankle plantarflexion  35  Ankle inversion  10  Ankle eversion  8   (Blank rows = not tested)  LOWER EXTREMITY MMT:    MMT Right Eval Left Eval  Hip flexion 4 4  Hip extension  Hip abduction 3 3  Hip adduction 3+ 3+  Hip internal rotation    Hip external rotation    Knee flexion 4- 4-  Knee extension 4 4  Ankle dorsiflexion 3 2+  Ankle plantarflexion 3+ 3  Ankle inversion    Ankle eversion    (Blank rows = not tested)  BED MOBILITY:  Not tested  TRANSFERS: Sit to stand: CGA  Assistive device utilized: None     Stand to sit: CGA  Assistive device utilized: None     Chair to chair: CGA  Assistive device utilized: None       RAMP:  Not tested  CURB:  Findings: needs UE support to step up  STAIRS: heavy BUE support step over step negotiation.  GAIT: Findings: Gait Characteristics: decreased stride length, Left foot flat, wide BOS, and poor foot clearance- Left, Distance walked: 60 ft, Assistive device utilized:None, Level of assistance: CGA, and Comments: increased L ankle rolling due to not wearing AFO  FUNCTIONAL TESTS:  5 times sit to stand: 30.53 with 4 posterior LOB 6 minute walk test: perform next session  Berg Balance Scale: 26  PATIENT SURVEYS:  LEFS give next session                                                                                                                               TREATMENT DATE: 02/04/24  TherAct:  Sit to stands with airex pad under feet: 10x; close CGA due to posterior LOB ; initially requires use of Ue's due to instability.   Green step with 2 purple raisers each side: -one foot on step, eccentric lateral lowering of back leg to ground; 10x each LE; UE support ; 2 sets each LE   RTB around knees: lateral step at // bar; x 8 reps cue for depth of squat   Matrix resisted ambulation: #7.5 lb; forward/backward ambulation x10   Seated: Lateral step over orange cone 10x each LE ; x2 sets   Activity Description: seated  on edge of plinth table; pods surrounding patient, twist and smack pod  Activity Setting:  The Blaze Pod Random setting was chosen to enhance cognitive processing and agility, providing an unpredictable environment to simulate real-world scenarios, and fostering quick reactions and adaptability.   Number of Pods:  6 Cycles/Sets:  3 Duration (Time or Hit Count):  15   Neuro Re-ed:  BAPS board: LLE -static hold 30 seconds;  -30 seconds hold and 10 df/pf ; 2 sets -30 seconds hold and 10x  inversion/eversion 2 sets -circles / 30 seconds each direction   PATIENT EDUCATION: Education details: goals, POC, HEP Person educated: Patient Education method: Explanation, Demonstration, Tactile cues, Verbal cues, and Handouts Education comprehension: verbalized understanding, returned demonstration, verbal cues required, tactile cues required, and needs further education  HOME EXERCISE PROGRAM: Access Code: [email or web address] URL: [email or web address] Date: 12/29/2023 Prepared by: Samir Ishaq  Exercises - Leg Extension  - 1 x daily -  7 x weekly - 2 sets - 10 reps - 5 hold - Seated March  - 1 x daily - 7 x weekly - 2 sets - 10 reps - 5 hold - Seated Heel Raise  - 1 x daily - 7 x weekly - 2 sets - 10 reps - 5 hold  GOALS: Goals reviewed with patient? Yes  SHORT TERM GOALS: Target date: 01/26/2024    Patient will be independent in home exercise program to improve strength/mobility for better functional independence with ADLs.  Baseline: Goal status: INITIAL  LONG TERM GOALS: Target date: 03/22/2024    Patient (< 25 years old) will complete five times sit to stand test in < 10 seconds indicating an increased LE strength and improved balance.  Baseline: 30.53 with 4 posterior LOB Goal status: INITIAL  2.   Patient will increase Berg Balance score by >45/56 to demonstrate decreased fall risk during functional activities.  Baseline: 5/5: 26  Goal status: INITIAL  3.    Patient  will increase six minute walk test distance to >1200 for progression to age norm community ambulator and improve gait ability  Baseline: perform next session 5/12: 710 ft  Goal status: INITIAL  4.  Patient will score > 60/80 on LEFS for improved functional mobility and quality of life.  Baseline: perform next session 5/12: 43 Goal status: INITIAL    ASSESSMENT:  CLINICAL IMPRESSION: Patient continues to require use of Ue's for sit to stand on airex pad due to instability. Lateral eccentric step downs is more challenging than anterior/posterior indicating continued area of focus.  Seated trunk rotation is very challenging with blaze pods initially with patient challenged with posterior positioned pods. .Patient will benefit from skilled physical therapy to improve strength, mobility, and stability.   OBJECTIVE IMPAIRMENTS: Abnormal gait, cardiopulmonary status limiting activity, decreased activity tolerance, decreased balance, decreased coordination, decreased endurance, decreased knowledge of use of DME, decreased mobility, difficulty walking, decreased ROM, decreased strength, dizziness, impaired perceived functional ability, impaired flexibility, impaired vision/preception, improper body mechanics, postural dysfunction, and obesity.   ACTIVITY LIMITATIONS: carrying, lifting, bending, sitting, standing, squatting, stairs, transfers, bed mobility, toileting, dressing, reach over head, hygiene/grooming, locomotion level, and caring for others  PARTICIPATION LIMITATIONS: meal prep, cleaning, laundry, driving, shopping, community activity, occupation, and yard work  PERSONAL FACTORS: Age, Behavior pattern, Fitness, Past/current experiences, Time since onset of injury/illness/exacerbation, Transportation, and 3+ comorbidities: DM, HTN, Anemia, Bells palsy, Hypothyroidism, Kidney stones, stroke and pseudotumour cerebri. Macular degeneration with impaired visual acuity  are also affecting patient's  functional outcome.   REHAB POTENTIAL: Good  CLINICAL DECISION MAKING: Evolving/moderate complexity  EVALUATION COMPLEXITY: Moderate  PLAN:  PT FREQUENCY: 2x/week  PT DURATION: 12 weeks  PLANNED INTERVENTIONS: 97164- PT Re-evaluation, 97750- Physical Performance Testing, 97110-Therapeutic exercises, 97530- Therapeutic activity, [id number]- Neuromuscular re-education, 97535- Self Care, 69629- Manual therapy, [id number]- Gait training, [id number]- Orthotic Initial, [id number]- Orthotic/Prosthetic subsequent, [phone]- Canalith repositioning, [id number]- Splinting, L2440- Electrical stimulation (unattended), [phone]- Electrical stimulation (manual), [id number]- Vasopneumatic device, [id number]- Ultrasound, [id number]- Traction (mechanical), [id number]- Ionotophoresis 4mg /ml Dexamethasone , Patient/Family education, Balance training, Stair training, Taping, Dry Needling, Joint mobilization, Spinal mobilization, Scar mobilization, Compression bandaging, Vestibular training, Visual/preceptual remediation/compensation, Cognitive remediation, DME instructions, Cryotherapy, Moist heat, and Biofeedback  PLAN FOR NEXT SESSION:   balance, strength, L ankle strength, stairs    Naphtali Zywicki, PT 02/04/2024, 11:05 AM

## 2024-02-04 ENCOUNTER — Ambulatory Visit: Payer: Medicare Other

## 2024-02-04 DIAGNOSIS — M6281 Muscle weakness (generalized): Secondary | ICD-10-CM

## 2024-02-04 DIAGNOSIS — R2689 Other abnormalities of gait and mobility: Secondary | ICD-10-CM

## 2024-02-04 DIAGNOSIS — R2681 Unsteadiness on feet: Secondary | ICD-10-CM

## 2024-02-04 DIAGNOSIS — R269 Unspecified abnormalities of gait and mobility: Secondary | ICD-10-CM

## 2024-02-05 LAB — LAB REPORT - SCANNED
PTH, Intact: 464
TSH: 17.12 — AB (ref 0.41–5.90)

## 2024-02-06 ENCOUNTER — Ambulatory Visit: Payer: Self-pay | Admitting: Internal Medicine

## 2024-02-06 ENCOUNTER — Other Ambulatory Visit: Payer: Self-pay

## 2024-02-06 DIAGNOSIS — E039 Hypothyroidism, unspecified: Secondary | ICD-10-CM

## 2024-02-06 NOTE — Telephone Encounter (Addendum)
 Spoke with Patient regarding the lab results. Patient states he will call back and confirm the dose of Synthroid  he is on. Patient is scheduled for a repeat TSH on 03/26/24. Lab ordered.

## 2024-02-06 NOTE — Telephone Encounter (Signed)
 Copied from CRM [id number]. Topic: Clinical - Lab/Test Results >> Feb 06, 2024  2:21 PM Melissa C wrote: Reason for CRM: patient returning call to office. Advised I would let him know call was received and he would be contacted soon.

## 2024-02-09 ENCOUNTER — Ambulatory Visit: Payer: Medicare Other

## 2024-02-09 DIAGNOSIS — R269 Unspecified abnormalities of gait and mobility: Secondary | ICD-10-CM

## 2024-02-09 DIAGNOSIS — M6281 Muscle weakness (generalized): Secondary | ICD-10-CM | POA: Diagnosis not present

## 2024-02-09 DIAGNOSIS — R2681 Unsteadiness on feet: Secondary | ICD-10-CM

## 2024-02-09 NOTE — Therapy (Signed)
 OUTPATIENT PHYSICAL THERAPY NEURO TREATMENT   Patient Name: Gregory Crane MRN: [id number] DOB:May 15, 1971, 53 y.o., male Today's Date: 02/09/2024   PCP: Dellar Fenton  REFERRING PROVIDER: Dellar Fenton  END OF SESSION:  PT End of Session - 02/09/24 0915     Visit Number 8    Number of Visits 24    Date for PT Re-Evaluation 03/22/24    PT Start Time 0916    PT Stop Time 0956    PT Time Calculation (min) 40 min    Equipment Utilized During Treatment Gait belt    Activity Tolerance Patient tolerated treatment well    Behavior During Therapy [facility] for tasks assessed/performed                Past Medical History:  Diagnosis Date   Allergy    Anemia    Bell's palsy    Diabetes mellitus without complication (HCC)    diet controlled   Fall 07/2023   Hypertension    Hypothyroidism    Kidney stones    Pseudotumor cerebri    Stroke [facility])    Past Surgical History:  Procedure Laterality Date   COLONOSCOPY WITH PROPOFOL  N/A 03/30/2020   Procedure: COLONOSCOPY WITH PROPOFOL ;  Surgeon: Shane Darling, MD;  Location: [facility] ENDOSCOPY;  Service: Endoscopy;  Laterality: N/A;   LOOP RECORDER INSERTION N/A 01/27/2018   Procedure: LOOP RECORDER INSERTION;  Surgeon: Verona Goodwill, MD;  Location: [facility] INVASIVE CV LAB;  Service: Cardiovascular;  Laterality: N/A;   LUMBAR PUNCTURE     as child   NO PAST SURGERIES     REMOVAL OF A DIALYSIS CATHETER     TEE WITHOUT CARDIOVERSION N/A 01/07/2018   Procedure: TRANSESOPHAGEAL ECHOCARDIOGRAM (TEE);  Surgeon: Devorah Fonder, MD;  Location: [facility] ORS;  Service: Cardiovascular;  Laterality: N/A;   Patient Active Problem List   Diagnosis Date Noted   Left hip pain 01/30/2024   Acute on chronic heart failure with preserved ejection fraction (HFpEF) (HCC) 12/26/2023   Community acquired pneumonia 12/24/2023   Acute respiratory failure with hypoxia (HCC) 12/23/2023   SVT (supraventricular tachycardia) (HCC) 08/10/2023   Atrial  fibrillation with rapid ventricular response (HCC) 07/03/2023   Atrial fibrillation with RVR (HCC) 07/02/2023   Fall 06/15/2023   Non-compliance with renal dialysis (HCC) 05/02/2023   Obesity (BMI 30-39.9) 04/30/2023   Volume overload 04/29/2023   Renal osteodystrophy 11/25/2022   Unsteady gait 10/05/2022   Steal syndrome of dialysis vascular access (HCC) 05/28/2022   Hydronephrosis, left 05/04/2022   Leukocytosis 05/04/2022   Chest pain 01/13/2022   Diabetic retinopathy associated with diabetes mellitus due to underlying condition (HCC) 01/13/2022   ESRD on hemodialysis (HCC) 01/13/2022   Deafness in right ear 08/11/2021   Open wound 01/25/2021   History of colon polyps 10/15/2020   Acute cholecystitis without calculus 09/09/2020   Type 2 diabetes mellitus, with long-term current use of insulin  (HCC) 09/09/2020   Cryptogenic stroke (HCC) 07/13/2020   History of loop recorder 07/13/2020   Elevated troponin 04/03/2020   Acquired trigger finger 06/08/2019   Lymphedema 06/08/2019   Anemia 04/17/2019   Swelling of both lower extremities 01/10/2019   Facial droop 04/09/2018   Daytime somnolence 03/30/2018   Carotid artery disease (HCC) 02/03/2018   Intracranial vascular stenosis 09/12/2017   Cough 01/20/2017   Bell's palsy 11/10/2016   History of CVA (cerebrovascular accident) 11/10/2016   Benign localized hyperplasia of prostate with urinary obstruction 10/27/2016   History of nephrolithiasis 10/27/2016  TIA (transient ischemic attack) 10/20/2016   Near syncope 06/23/2016   Organic impotence 10/01/2015   Neuropathy 08/06/2015   Health care maintenance 08/06/2015   Hypertension 08/06/2015   Hypothyroidism 10/10/2013   Microalbuminuria 10/10/2013   Hyperlipidemia 10/10/2013   B12 deficiency 10/10/2013   Environmental allergies 07/04/2013    ONSET DATE: 3 years ago  REFERRING DIAG: unsteady gait  THERAPY DIAG:  Muscle weakness (generalized)  Abnormality of gait and  mobility  Unsteadiness on feet  Rationale for Evaluation and Treatment: Rehabilitation  SUBJECTIVE:                                                                                                                                                                                             SUBJECTIVE STATEMENT: Pt reports that he had a slow weekend and didn't do much walking or activity. Pt states that he felt fine after previous session and denies prolonged fatigue.   Pt accompanied by: self  PERTINENT HISTORY: Patient returning to PT s/p hospitalization for pneumonia, , SVT, ESRD on hypodialysis. PMH DM, HTN, Anemia, Bells palsy, Hypothyroidism, Kidney stones, stroke and pseudotumour cerebri. Macular degeneration with impaired visual acuity   PAIN:  Are you having pain? Some L foot numbness/neuropathy  PRECAUTIONS: Fall  RED FLAGS: None   WEIGHT BEARING RESTRICTIONS: No  FALLS: Has patient fallen in last 6 months? No  LIVING ENVIRONMENT: Lives with: lives alone Lives in: House/apartment Stairs: yes stairs in house Has following equipment at home: Single point cane  PLOF: Independent with basic ADLs  PATIENT GOALS: balance, strength  OBJECTIVE:  Note: Objective measures were completed at Evaluation unless otherwise noted.  DIAGNOSTIC FINDINGS: IMPRESSION: Interval resolution of previously noted trace left parafalcine subdural hematoma. No acute intracranial process.  COGNITION: Overall cognitive status: Within functional limits for tasks assessed   SENSATION: WFL  COORDINATION: Heel slide: WFL RLE; slight decrease with LLE   MUSCLE TONE: WFL  POSTURE: rounded shoulders, forward head, and posterior pelvic tilt  LOWER EXTREMITY ROM:     Active  Right Eval Left Eval  Hip flexion    Hip extension    Hip abduction    Hip adduction    Hip internal rotation    Hip external rotation    Knee flexion    Knee extension    Ankle dorsiflexion  -3  Ankle  plantarflexion  35  Ankle inversion  10  Ankle eversion  8   (Blank rows = not tested)  LOWER EXTREMITY MMT:    MMT Right Eval Left Eval  Hip flexion 4 4  Hip extension    Hip abduction 3  3  Hip adduction 3+ 3+  Hip internal rotation    Hip external rotation    Knee flexion 4- 4-  Knee extension 4 4  Ankle dorsiflexion 3 2+  Ankle plantarflexion 3+ 3  Ankle inversion    Ankle eversion    (Blank rows = not tested)  BED MOBILITY:  Not tested  TRANSFERS: Sit to stand: CGA  Assistive device utilized: None     Stand to sit: CGA  Assistive device utilized: None     Chair to chair: CGA  Assistive device utilized: None       RAMP:  Not tested  CURB:  Findings: needs UE support to step up  STAIRS: heavy BUE support step over step negotiation.  GAIT: Findings: Gait Characteristics: decreased stride length, Left foot flat, wide BOS, and poor foot clearance- Left, Distance walked: 60 ft, Assistive device utilized:None, Level of assistance: CGA, and Comments: increased L ankle rolling due to not wearing AFO  FUNCTIONAL TESTS:  5 times sit to stand: 30.53 with 4 posterior LOB 6 minute walk test: perform next session  Berg Balance Scale: 26  PATIENT SURVEYS:  LEFS give next session                                                                                                                               TREATMENT DATE: 02/09/24  TherEx: Octane bike leg press x4 minutes level 7- pt with instances of L foot shifting medially, SPT assist with re-adjustment of foot position on machine  Sit to stands 10x; close CGA due to posterior LOB ; pt utilizing upper extremities on knees to push off into standing and control descent to sitting; one instance of posterior LOB- required minA to correct posturing.   Side step over orange hurdle x10 both directions at support bar- pt with notable posterior lean throughout but is able to self-correct to prevent LOB.  Seated: Lateral step  over orange cone 10x each LE ; x2 sets  Adductor squeeze with 3 second hold 2x10  Adductor squeeze with lateral toe taps x10 each LE  RTB HS curls x10 each LE  Toe raises x10 each LE   Neuro Re-ed:  Side step on wide Airex balance beam x10 at support bar- pt with notable posterior lean throughout but is able to self-correct to prevent LOB.  Activity Description: Pt in standing at support bar with pods in semicircle. Pt instructed to tap pod with foot as it lights up. 1 round of alternating LE taps, 2 round of specific taps with each LE (2x10 taps with R, 2x10 taps with L) Activity Setting:  The Blaze Pod Random setting was chosen to enhance cognitive processing and agility, providing an unpredictable environment to simulate real-world scenarios, and fostering quick reactions and adaptability.   Number of Pods:  6 Cycles/Sets:  5 Duration (Time or Hit Count):  10  Patient Stats  Hits:   50   PATIENT  EDUCATION: Education details: goals, POC, HEP Person educated: Patient Education method: Explanation, Demonstration, Tactile cues, Verbal cues, and Handouts Education comprehension: verbalized understanding, returned demonstration, verbal cues required, tactile cues required, and needs further education  HOME EXERCISE PROGRAM: Access Code: [email or web address] URL: [email or web address] Date: 12/29/2023 Prepared by: Marina  Moser  Exercises - Leg Extension  - 1 x daily - 7 x weekly - 2 sets - 10 reps - 5 hold - Seated March  - 1 x daily - 7 x weekly - 2 sets - 10 reps - 5 hold - Seated Heel Raise  - 1 x daily - 7 x weekly - 2 sets - 10 reps - 5 hold  GOALS: Goals reviewed with patient? Yes  SHORT TERM GOALS: Target date: 01/26/2024    Patient will be independent in home exercise program to improve strength/mobility for better functional independence with ADLs.  Baseline: Goal status: INITIAL  LONG TERM GOALS: Target date: 03/22/2024    Patient (< 30 years old) will  complete five times sit to stand test in < 10 seconds indicating an increased LE strength and improved balance.  Baseline: 30.53 with 4 posterior LOB Goal status: INITIAL  2.   Patient will increase Berg Balance score by >45/56 to demonstrate decreased fall risk during functional activities.  Baseline: 5/5: 26  Goal status: INITIAL  3.    Patient will increase six minute walk test distance to >1200 for progression to age norm community ambulator and improve gait ability  Baseline: perform next session 5/12: 710 ft  Goal status: INITIAL  4.  Patient will score > 60/80 on LEFS for improved functional mobility and quality of life.  Baseline: perform next session 5/12: 43 Goal status: INITIAL    ASSESSMENT:  CLINICAL IMPRESSION: Patient with notable fatigue of L LE after warm-up on Octane bike. Pt unable to perform STS with Airex pad under feet today due to LE fatigue. Pt with multiple instances of posterior lean/LOB during STS and standing side-steps. Pt demonstrated improved trunk rotation with Blaze Pod activity today and tolerated dynamic balance well. Will continue to focus on LE endurance in subsequent sessions with future warm-ups on Octane bike at a lower level resistance to prevent early fatigue of LE's. Patient will benefit from skilled physical therapy to improve strength, mobility, and stability.   OBJECTIVE IMPAIRMENTS: Abnormal gait, cardiopulmonary status limiting activity, decreased activity tolerance, decreased balance, decreased coordination, decreased endurance, decreased knowledge of use of DME, decreased mobility, difficulty walking, decreased ROM, decreased strength, dizziness, impaired perceived functional ability, impaired flexibility, impaired vision/preception, improper body mechanics, postural dysfunction, and obesity.   ACTIVITY LIMITATIONS: carrying, lifting, bending, sitting, standing, squatting, stairs, transfers, bed mobility, toileting, dressing, reach over head,  hygiene/grooming, locomotion level, and caring for others  PARTICIPATION LIMITATIONS: meal prep, cleaning, laundry, driving, shopping, community activity, occupation, and yard work  PERSONAL FACTORS: Age, Behavior pattern, Fitness, Past/current experiences, Time since onset of injury/illness/exacerbation, Transportation, and 3+ comorbidities: DM, HTN, Anemia, Bells palsy, Hypothyroidism, Kidney stones, stroke and pseudotumour cerebri. Macular degeneration with impaired visual acuity  are also affecting patient's functional outcome.   REHAB POTENTIAL: Good  CLINICAL DECISION MAKING: Evolving/moderate complexity  EVALUATION COMPLEXITY: Moderate  PLAN:  PT FREQUENCY: 2x/week  PT DURATION: 12 weeks  PLANNED INTERVENTIONS: 97164- PT Re-evaluation, 97750- Physical Performance Testing, 97110-Therapeutic exercises, 97530- Therapeutic activity, [id number]- Neuromuscular re-education, 97535- Self Care, 54098- Manual therapy, [id number]- Gait training, [id number]- Orthotic Initial, [id number]- Orthotic/Prosthetic subsequent, [phone]- Canalith repositioning, [id number]- Splinting, N8295- Electrical stimulation (unattended), [id number]- Electrical  stimulation (manual), 16109- Vasopneumatic device, [id number]- Ultrasound, [id number]- Traction (mechanical), [id number]- Ionotophoresis 4mg /ml Dexamethasone , Patient/Family education, Balance training, Stair training, Taping, Dry Needling, Joint mobilization, Spinal mobilization, Scar mobilization, Compression bandaging, Vestibular training, Visual/preceptual remediation/compensation, Cognitive remediation, DME instructions, Cryotherapy, Moist heat, and Biofeedback  PLAN FOR NEXT SESSION:   balance, strength, L ankle strength, stairs, LE endurance   Selinda Dales, SPT  This entire session was performed under direct supervision and direction of a licensed Estate agent . I have personally read, edited and approve of the note as written.   Marina  Moser, PT 02/09/2024, 11:03  AM

## 2024-02-11 ENCOUNTER — Ambulatory Visit: Payer: Medicare Other

## 2024-02-12 ENCOUNTER — Encounter: Payer: Self-pay | Admitting: Nephrology

## 2024-02-16 ENCOUNTER — Ambulatory Visit: Payer: Medicare Other

## 2024-02-16 DIAGNOSIS — R278 Other lack of coordination: Secondary | ICD-10-CM

## 2024-02-16 DIAGNOSIS — R262 Difficulty in walking, not elsewhere classified: Secondary | ICD-10-CM

## 2024-02-16 DIAGNOSIS — M6281 Muscle weakness (generalized): Secondary | ICD-10-CM

## 2024-02-16 DIAGNOSIS — R269 Unspecified abnormalities of gait and mobility: Secondary | ICD-10-CM

## 2024-02-16 DIAGNOSIS — R2689 Other abnormalities of gait and mobility: Secondary | ICD-10-CM

## 2024-02-16 DIAGNOSIS — R2681 Unsteadiness on feet: Secondary | ICD-10-CM

## 2024-02-16 NOTE — Therapy (Signed)
 OUTPATIENT PHYSICAL THERAPY NEURO TREATMENT   Patient Name: Gregory Crane MRN: [id number] DOB:11-Apr-1971, 53 y.o., male Today's Date: 02/16/2024   PCP: Glendia Shad  REFERRING PROVIDER: Glendia Shad  END OF SESSION:  PT End of Session - 02/16/24 1056     Visit Number 9    Number of Visits 24    Date for PT Re-Evaluation 03/22/24    PT Start Time 1100    PT Stop Time 1142    PT Time Calculation (min) 42 min    Equipment Utilized During Treatment Gait belt    Activity Tolerance Patient tolerated treatment well    Behavior During Therapy [facility] for tasks assessed/performed                 Past Medical History:  Diagnosis Date   Allergy    Anemia    Bell's palsy    Diabetes mellitus without complication (HCC)    diet controlled   Fall 07/2023   Hypertension    Hypothyroidism    Kidney stones    Pseudotumor cerebri    Stroke [facility])    Past Surgical History:  Procedure Laterality Date   COLONOSCOPY WITH PROPOFOL  N/A 03/30/2020   Procedure: COLONOSCOPY WITH PROPOFOL ;  Surgeon: Maryruth Ole DASEN, MD;  Location: [facility] ENDOSCOPY;  Service: Endoscopy;  Laterality: N/A;   LOOP RECORDER INSERTION N/A 01/27/2018   Procedure: LOOP RECORDER INSERTION;  Surgeon: Fernande Elspeth BROCKS, MD;  Location: [facility] INVASIVE CV LAB;  Service: Cardiovascular;  Laterality: N/A;   LUMBAR PUNCTURE     as child   NO PAST SURGERIES     REMOVAL OF A DIALYSIS CATHETER     TEE WITHOUT CARDIOVERSION N/A 01/07/2018   Procedure: TRANSESOPHAGEAL ECHOCARDIOGRAM (TEE);  Surgeon: Perla Evalene PARAS, MD;  Location: [facility] ORS;  Service: Cardiovascular;  Laterality: N/A;   Patient Active Problem List   Diagnosis Date Noted   Left hip pain 01/30/2024   Acute on chronic heart failure with preserved ejection fraction (HFpEF) (HCC) 12/26/2023   Community acquired pneumonia 12/24/2023   Acute respiratory failure with hypoxia (HCC) 12/23/2023   SVT (supraventricular tachycardia) (HCC) 08/10/2023   Atrial  fibrillation with rapid ventricular response (HCC) 07/03/2023   Atrial fibrillation with RVR (HCC) 07/02/2023   Fall 06/15/2023   Non-compliance with renal dialysis (HCC) 05/02/2023   Obesity (BMI 30-39.9) 04/30/2023   Volume overload 04/29/2023   Renal osteodystrophy 11/25/2022   Unsteady gait 10/05/2022   Steal syndrome of dialysis vascular access (HCC) 05/28/2022   Hydronephrosis, left 05/04/2022   Leukocytosis 05/04/2022   Chest pain 01/13/2022   Diabetic retinopathy associated with diabetes mellitus due to underlying condition (HCC) 01/13/2022   ESRD on hemodialysis (HCC) 01/13/2022   Deafness in right ear 08/11/2021   Open wound 01/25/2021   History of colon polyps 10/15/2020   Acute cholecystitis without calculus 09/09/2020   Type 2 diabetes mellitus, with long-term current use of insulin  (HCC) 09/09/2020   Cryptogenic stroke (HCC) 07/13/2020   History of loop recorder 07/13/2020   Elevated troponin 04/03/2020   Acquired trigger finger 06/08/2019   Lymphedema 06/08/2019   Anemia 04/17/2019   Swelling of both lower extremities 01/10/2019   Facial droop 04/09/2018   Daytime somnolence 03/30/2018   Carotid artery disease (HCC) 02/03/2018   Intracranial vascular stenosis 09/12/2017   Cough 01/20/2017   Bell's palsy 11/10/2016   History of CVA (cerebrovascular accident) 11/10/2016   Benign localized hyperplasia of prostate with urinary obstruction 10/27/2016   History of nephrolithiasis  10/27/2016   TIA (transient ischemic attack) 10/20/2016   Near syncope 06/23/2016   Organic impotence 10/01/2015   Neuropathy 08/06/2015   Health care maintenance 08/06/2015   Hypertension 08/06/2015   Hypothyroidism 10/10/2013   Microalbuminuria 10/10/2013   Hyperlipidemia 10/10/2013   B12 deficiency 10/10/2013   Environmental allergies 07/04/2013    ONSET DATE: 3 years ago  REFERRING DIAG: unsteady gait  THERAPY DIAG:  Muscle weakness (generalized)  Abnormality of gait and  mobility  Unsteadiness on feet  Other abnormalities of gait and mobility  Other lack of coordination  Difficulty in walking, not elsewhere classified  Rationale for Evaluation and Treatment: Rehabilitation  SUBJECTIVE:                                                                                                                                                                                             SUBJECTIVE STATEMENT: Pt reports that he is feeling more weak over the last week. Pt states he tried to go on an extended walk a couple times over the last week and he had to turn around halfway through due to weakness in his legs. Pt also states that he is getting frustrated with his AFO and feels like it is forcing his foot to roll out at times when it should be preventing it.    Pt accompanied by: self  PERTINENT HISTORY: Patient returning to PT s/p hospitalization for pneumonia, , SVT, ESRD on hypodialysis. PMH DM, HTN, Anemia, Bells palsy, Hypothyroidism, Kidney stones, stroke and pseudotumour cerebri. Macular degeneration with impaired visual acuity   PAIN:  Are you having pain? Some L foot numbness/neuropathy  PRECAUTIONS: Fall  RED FLAGS: None   WEIGHT BEARING RESTRICTIONS: No  FALLS: Has patient fallen in last 6 months? No  LIVING ENVIRONMENT: Lives with: lives alone Lives in: House/apartment Stairs: yes stairs in house Has following equipment at home: Single point cane  PLOF: Independent with basic ADLs  PATIENT GOALS: balance, strength  OBJECTIVE:  Note: Objective measures were completed at Evaluation unless otherwise noted.  DIAGNOSTIC FINDINGS: IMPRESSION: Interval resolution of previously noted trace left parafalcine subdural hematoma. No acute intracranial process.  COGNITION: Overall cognitive status: Within functional limits for tasks assessed   SENSATION: WFL  COORDINATION: Heel slide: WFL RLE; slight decrease with LLE   MUSCLE TONE:  WFL  POSTURE: rounded shoulders, forward head, and posterior pelvic tilt  LOWER EXTREMITY ROM:     Active  Right Eval Left Eval  Hip flexion    Hip extension    Hip abduction    Hip adduction    Hip internal rotation  Hip external rotation    Knee flexion    Knee extension    Ankle dorsiflexion  -3  Ankle plantarflexion  35  Ankle inversion  10  Ankle eversion  8   (Blank rows = not tested)  LOWER EXTREMITY MMT:    MMT Right Eval Left Eval  Hip flexion 4 4  Hip extension    Hip abduction 3 3  Hip adduction 3+ 3+  Hip internal rotation    Hip external rotation    Knee flexion 4- 4-  Knee extension 4 4  Ankle dorsiflexion 3 2+  Ankle plantarflexion 3+ 3  Ankle inversion    Ankle eversion    (Blank rows = not tested)  BED MOBILITY:  Not tested  TRANSFERS: Sit to stand: CGA  Assistive device utilized: None     Stand to sit: CGA  Assistive device utilized: None     Chair to chair: CGA  Assistive device utilized: None       RAMP:  Not tested  CURB:  Findings: needs UE support to step up  STAIRS: heavy BUE support step over step negotiation.  GAIT: Findings: Gait Characteristics: decreased stride length, Left foot flat, wide BOS, and poor foot clearance- Left, Distance walked: 60 ft, Assistive device utilized:None, Level of assistance: CGA, and Comments: increased L ankle rolling due to not wearing AFO  FUNCTIONAL TESTS:  5 times sit to stand: 30.53 with 4 posterior LOB 6 minute walk test: perform next session  Berg Balance Scale: 26  PATIENT SURVEYS:  LEFS give next session                                                                                                                               TREATMENT DATE: 02/16/24  TherEx: Octane bike leg press x5 minutes level 4- pt reported legs feel like jelly after  Sit to stands x10; close CGA due to posterior LOB ; pt utilizing upper extremities on knees to push off into standing and control  descent to sitting   Side step over orange hurdle x10 both directions at support bar- pt with notable posterior lean throughout but is able to self-correct to prevent LOB.  Forward/Backward step over orange hurdle x10 at support bar- heavy UE use on support bar, pt with instances of R heel catching on hurdle with backward stepping- close CGA throughout due to posterior LOB   Seated: Marches with 3# AW 2x10 both LE  LAQ with 3# AW x10 each LE   GTB HS curl 2x10 both LE  Adductor squeeze with 3 second hold 2x10  Adductor squeeze with lateral toe taps x10 each LE   Neuro Re-ed:  Side stepping with Airex pad on both sides of 6 step 2x10 both directions at support bar- pt reports task as a bit difficult  Forward stepping with Airex pad on both sides of 6 step at support bar x10- pt reports task as medium difficulty  PATIENT EDUCATION: Education details: goals, POC, HEP Person educated: Patient Education method: Explanation, Demonstration, Tactile cues, Verbal cues, and Handouts Education comprehension: verbalized understanding, returned demonstration, verbal cues required, tactile cues required, and needs further education  HOME EXERCISE PROGRAM: Access Code: [email or web address] URL: [email or web address] Date: 12/29/2023 Prepared by: Marina  Moser  Exercises - Leg Extension  - 1 x daily - 7 x weekly - 2 sets - 10 reps - 5 hold - Seated March  - 1 x daily - 7 x weekly - 2 sets - 10 reps - 5 hold - Seated Heel Raise  - 1 x daily - 7 x weekly - 2 sets - 10 reps - 5 hold  GOALS: Goals reviewed with patient? Yes  SHORT TERM GOALS: Target date: 01/26/2024    Patient will be independent in home exercise program to improve strength/mobility for better functional independence with ADLs.  Baseline: Goal status: INITIAL  LONG TERM GOALS: Target date: 03/22/2024    Patient (< 13 years old) will complete five times sit to stand test in < 10 seconds indicating an  increased LE strength and improved balance.  Baseline: 30.53 with 4 posterior LOB Goal status: INITIAL  2.   Patient will increase Berg Balance score by >45/56 to demonstrate decreased fall risk during functional activities.  Baseline: 5/5: 26  Goal status: INITIAL  3.    Patient will increase six minute walk test distance to >1200 for progression to age norm community ambulator and improve gait ability  Baseline: perform next session 5/12: 710 ft  Goal status: INITIAL  4.  Patient will score > 60/80 on LEFS for improved functional mobility and quality of life.  Baseline: perform next session 5/12: 43 Goal status: INITIAL    ASSESSMENT:  CLINICAL IMPRESSION: Pt presented with decreased endurance and increased LE fatigue today, but remained highly motivated throughout session. Pt tolerated progression of LE dynamic balance exercises well with frequent seated rest breaks to prevent excessive fatigue. Pt was receptive to education on orthotic and how to contact [facility] for adjustments to brace as needed. Patient will benefit from skilled physical therapy to improve strength, mobility, and stability.   OBJECTIVE IMPAIRMENTS: Abnormal gait, cardiopulmonary status limiting activity, decreased activity tolerance, decreased balance, decreased coordination, decreased endurance, decreased knowledge of use of DME, decreased mobility, difficulty walking, decreased ROM, decreased strength, dizziness, impaired perceived functional ability, impaired flexibility, impaired vision/preception, improper body mechanics, postural dysfunction, and obesity.   ACTIVITY LIMITATIONS: carrying, lifting, bending, sitting, standing, squatting, stairs, transfers, bed mobility, toileting, dressing, reach over head, hygiene/grooming, locomotion level, and caring for others  PARTICIPATION LIMITATIONS: meal prep, cleaning, laundry, driving, shopping, community activity, occupation, and yard work  PERSONAL FACTORS:  Age, Behavior pattern, Fitness, Past/current experiences, Time since onset of injury/illness/exacerbation, Transportation, and 3+ comorbidities: DM, HTN, Anemia, Bells palsy, Hypothyroidism, Kidney stones, stroke and pseudotumour cerebri. Macular degeneration with impaired visual acuity  are also affecting patient's functional outcome.   REHAB POTENTIAL: Good  CLINICAL DECISION MAKING: Evolving/moderate complexity  EVALUATION COMPLEXITY: Moderate  PLAN:  PT FREQUENCY: 2x/week  PT DURATION: 12 weeks  PLANNED INTERVENTIONS: 97164- PT Re-evaluation, 97750- Physical Performance Testing, 97110-Therapeutic exercises, 97530- Therapeutic activity, [id number]- Neuromuscular re-education, 97535- Self Care, [id number]- Manual therapy, [id number]- Gait training, [id number]- Orthotic Initial, [id number]- Orthotic/Prosthetic subsequent, [phone]- Canalith repositioning, [id number]- Splinting, H9716- Electrical stimulation (unattended), [id number]- Electrical stimulation (manual), [id number]- Vasopneumatic device, [id number]- Ultrasound, [id number]- Traction (mechanical), [id number]- Ionotophoresis 4mg /ml Dexamethasone , Patient/Family education, Balance training, Stair training, Taping, Dry Needling, Joint mobilization, Spinal mobilization,  Scar mobilization, Compression bandaging, Vestibular training, Visual/preceptual remediation/compensation, Cognitive remediation, DME instructions, Cryotherapy, Moist heat, and Biofeedback  PLAN FOR NEXT SESSION:   balance, strength, L ankle strength, stairs, LE endurance   Janell Axe, SPT  This entire session was performed under direct supervision and direction of a licensed Estate agent . I have personally read, edited and approve of the note as written.   Marina  Moser, PT 02/16/2024, 1:50 PM

## 2024-02-18 ENCOUNTER — Ambulatory Visit: Payer: Medicare Other

## 2024-02-18 DIAGNOSIS — R2689 Other abnormalities of gait and mobility: Secondary | ICD-10-CM

## 2024-02-18 DIAGNOSIS — R2681 Unsteadiness on feet: Secondary | ICD-10-CM

## 2024-02-18 DIAGNOSIS — M6281 Muscle weakness (generalized): Secondary | ICD-10-CM

## 2024-02-18 DIAGNOSIS — R269 Unspecified abnormalities of gait and mobility: Secondary | ICD-10-CM

## 2024-02-18 NOTE — Therapy (Signed)
 OUTPATIENT PHYSICAL THERAPY NEURO TREATMENT/ Physical Therapy Progress Note   Dates of reporting period  12/29/23   to   02/18/24    Patient Name: Gregory Crane MRN: [id number] DOB:1971-03-01, 53 y.o., male Today's Date: 02/18/2024   PCP: Glendia Shad  REFERRING PROVIDER: Glendia Shad  END OF SESSION:  PT End of Session - 02/18/24 1014     Visit Number 10    Number of Visits 24    Date for PT Re-Evaluation 03/22/24    PT Start Time 1015    PT Stop Time 1057    PT Time Calculation (min) 42 min    Equipment Utilized During Treatment Gait belt    Activity Tolerance Patient tolerated treatment well    Behavior During Therapy [facility] for tasks assessed/performed                  Past Medical History:  Diagnosis Date   Allergy    Anemia    Bell's palsy    Diabetes mellitus without complication (HCC)    diet controlled   Fall 07/2023   Hypertension    Hypothyroidism    Kidney stones    Pseudotumor cerebri    Stroke [facility])    Past Surgical History:  Procedure Laterality Date   COLONOSCOPY WITH PROPOFOL  N/A 03/30/2020   Procedure: COLONOSCOPY WITH PROPOFOL ;  Surgeon: Maryruth Ole DASEN, MD;  Location: [facility] ENDOSCOPY;  Service: Endoscopy;  Laterality: N/A;   LOOP RECORDER INSERTION N/A 01/27/2018   Procedure: LOOP RECORDER INSERTION;  Surgeon: Fernande Elspeth BROCKS, MD;  Location: [facility] INVASIVE CV LAB;  Service: Cardiovascular;  Laterality: N/A;   LUMBAR PUNCTURE     as child   NO PAST SURGERIES     REMOVAL OF A DIALYSIS CATHETER     TEE WITHOUT CARDIOVERSION N/A 01/07/2018   Procedure: TRANSESOPHAGEAL ECHOCARDIOGRAM (TEE);  Surgeon: Perla Evalene PARAS, MD;  Location: [facility] ORS;  Service: Cardiovascular;  Laterality: N/A;   Patient Active Problem List   Diagnosis Date Noted   Left hip pain 01/30/2024   Acute on chronic heart failure with preserved ejection fraction (HFpEF) (HCC) 12/26/2023   Community acquired pneumonia 12/24/2023   Acute respiratory failure  with hypoxia (HCC) 12/23/2023   SVT (supraventricular tachycardia) (HCC) 08/10/2023   Atrial fibrillation with rapid ventricular response (HCC) 07/03/2023   Atrial fibrillation with RVR (HCC) 07/02/2023   Fall 06/15/2023   Non-compliance with renal dialysis (HCC) 05/02/2023   Obesity (BMI 30-39.9) 04/30/2023   Volume overload 04/29/2023   Renal osteodystrophy 11/25/2022   Unsteady gait 10/05/2022   Steal syndrome of dialysis vascular access (HCC) 05/28/2022   Hydronephrosis, left 05/04/2022   Leukocytosis 05/04/2022   Chest pain 01/13/2022   Diabetic retinopathy associated with diabetes mellitus due to underlying condition (HCC) 01/13/2022   ESRD on hemodialysis (HCC) 01/13/2022   Deafness in right ear 08/11/2021   Open wound 01/25/2021   History of colon polyps 10/15/2020   Acute cholecystitis without calculus 09/09/2020   Type 2 diabetes mellitus, with long-term current use of insulin  (HCC) 09/09/2020   Cryptogenic stroke (HCC) 07/13/2020   History of loop recorder 07/13/2020   Elevated troponin 04/03/2020   Acquired trigger finger 06/08/2019   Lymphedema 06/08/2019   Anemia 04/17/2019   Swelling of both lower extremities 01/10/2019   Facial droop 04/09/2018   Daytime somnolence 03/30/2018   Carotid artery disease (HCC) 02/03/2018   Intracranial vascular stenosis 09/12/2017   Cough 01/20/2017   Bell's palsy 11/10/2016   History of  CVA (cerebrovascular accident) 11/10/2016   Benign localized hyperplasia of prostate with urinary obstruction 10/27/2016   History of nephrolithiasis 10/27/2016   TIA (transient ischemic attack) 10/20/2016   Near syncope 06/23/2016   Organic impotence 10/01/2015   Neuropathy 08/06/2015   Health care maintenance 08/06/2015   Hypertension 08/06/2015   Hypothyroidism 10/10/2013   Microalbuminuria 10/10/2013   Hyperlipidemia 10/10/2013   B12 deficiency 10/10/2013   Environmental allergies 07/04/2013    ONSET DATE: 3 years ago  REFERRING  DIAG: unsteady gait  THERAPY DIAG:  Muscle weakness (generalized)  Abnormality of gait and mobility  Unsteadiness on feet  Other abnormalities of gait and mobility  Rationale for Evaluation and Treatment: Rehabilitation  SUBJECTIVE:                                                                                                                                                                                             SUBJECTIVE STATEMENT: Pt reports he is feeling more tired today and has not fully recovered from dialysis yesterday. Pt also states that he did not sleep well last night. Pt reports he was sore after previous session, but denies pain.   Pt accompanied by: self  PERTINENT HISTORY: Patient returning to PT s/p hospitalization for pneumonia, , SVT, ESRD on hypodialysis. PMH DM, HTN, Anemia, Bells palsy, Hypothyroidism, Kidney stones, stroke and pseudotumour cerebri. Macular degeneration with impaired visual acuity   PAIN:  Are you having pain? Some L foot numbness/neuropathy  PRECAUTIONS: Fall  RED FLAGS: None   WEIGHT BEARING RESTRICTIONS: No  FALLS: Has patient fallen in last 6 months? No  LIVING ENVIRONMENT: Lives with: lives alone Lives in: House/apartment Stairs: yes stairs in house Has following equipment at home: Single point cane  PLOF: Independent with basic ADLs  PATIENT GOALS: balance, strength  OBJECTIVE:  Note: Objective measures were completed at Evaluation unless otherwise noted.  DIAGNOSTIC FINDINGS: IMPRESSION: Interval resolution of previously noted trace left parafalcine subdural hematoma. No acute intracranial process.  COGNITION: Overall cognitive status: Within functional limits for tasks assessed   SENSATION: WFL  COORDINATION: Heel slide: WFL RLE; slight decrease with LLE   MUSCLE TONE: WFL  POSTURE: rounded shoulders, forward head, and posterior pelvic tilt  LOWER EXTREMITY ROM:     Active  Right Eval Left Eval   Hip flexion    Hip extension    Hip abduction    Hip adduction    Hip internal rotation    Hip external rotation    Knee flexion    Knee extension    Ankle dorsiflexion  -3  Ankle plantarflexion  35  Ankle inversion  10  Ankle eversion  8   (Blank rows = not tested)  LOWER EXTREMITY MMT:    MMT Right Eval Left Eval  Hip flexion 4 4  Hip extension    Hip abduction 3 3  Hip adduction 3+ 3+  Hip internal rotation    Hip external rotation    Knee flexion 4- 4-  Knee extension 4 4  Ankle dorsiflexion 3 2+  Ankle plantarflexion 3+ 3  Ankle inversion    Ankle eversion    (Blank rows = not tested)  BED MOBILITY:  Not tested  TRANSFERS: Sit to stand: CGA  Assistive device utilized: None     Stand to sit: CGA  Assistive device utilized: None     Chair to chair: CGA  Assistive device utilized: None       RAMP:  Not tested  CURB:  Findings: needs UE support to step up  STAIRS: heavy BUE support step over step negotiation.  GAIT: Findings: Gait Characteristics: decreased stride length, Left foot flat, wide BOS, and poor foot clearance- Left, Distance walked: 60 ft, Assistive device utilized:None, Level of assistance: CGA, and Comments: increased L ankle rolling due to not wearing AFO  FUNCTIONAL TESTS:  5 times sit to stand: 30.53 with 4 posterior LOB 6 minute walk test: perform next session  Berg Balance Scale: 26  PATIENT SURVEYS:  LEFS give next session                                                                                                                               TREATMENT DATE: 02/18/24  Physical Performance Measures Gait belt and CGA utilized for all testing  Pt performed 5 time sit<>stand (5xSTS): 14.47 sec (>15 sec indicates increased fall risk)   -  no posterior LOB, no UE use    [facility] PT Assessment - 02/18/24 0001       Berg Balance Test   Sit to Stand Able to stand without using hands and stabilize independently    Standing  Unsupported Able to stand 2 minutes with supervision    Sitting with Back Unsupported but Feet Supported on Floor or Stool Able to sit safely and securely 2 minutes    Stand to Sit Controls descent by using hands    Transfers Able to transfer safely, minor use of hands    Standing Unsupported with Eyes Closed Able to stand 10 seconds with supervision    Standing Unsupported with Feet Together Able to place feet together independently and stand for 1 minute with supervision    From Standing, Reach Forward with Outstretched Arm Can reach confidently >25 cm (10)    From Standing Position, Pick up Object from Floor Able to pick up shoe, needs supervision    From Standing Position, Turn to Look Behind Over each Shoulder Looks behind one side only/other side shows less weight shift    Turn 360 Degrees Able to turn 360 degrees safely  but slowly    Standing Unsupported, Alternately Place Feet on Step/Stool Able to stand independently and complete 8 steps >20 seconds    Standing Unsupported, One Foot in Front Able to take small step independently and hold 30 seconds    Standing on One Leg Tries to lift leg/unable to hold 3 seconds but remains standing independently    Total Score 42            6 Min Walk Test:  Instructed patient to ambulate as quickly and as safely as possible for 6 minutes using LRAD. Patient was allowed to take standing rest breaks without stopping the test, but if the patient required a sitting rest break the clock would be stopped and the test would be over.  Results: 960 feet with CGA. Results indicate that the patient has reduced endurance with ambulation compared to age matched norms.  Age Matched Norms: 51-69 yo M: 14 F: 74, 75-79 yo M: 4 F: 471, 26-89 yo M: 417 F: 392 MDC: 58.21 meters (190.98 feet) or 50 meters (ANPTA Core Set of Outcome Measures for Adults with Neurologic Conditions, 2018)   - LEFS: 37/80 today, SPT read questions out loud due to pt's visual  impairments  Ther-Ex: Seated: - lateral taps over orange hurdle x10 BLE - Adduction squeeze with 3 second holds 2x10 - GTB HS curls x10 BLE - Straight leg abduction/adduction x10 BLE    PATIENT EDUCATION: Education details: goals, POC, HEP Person educated: Patient Education method: Explanation, Demonstration, Tactile cues, Verbal cues, and Handouts Education comprehension: verbalized understanding, returned demonstration, verbal cues required, tactile cues required, and needs further education  HOME EXERCISE PROGRAM: Access Code: [email or web address] URL: [email or web address] Date: 12/29/2023 Prepared by: Marina  Moser  Exercises - Leg Extension  - 1 x daily - 7 x weekly - 2 sets - 10 reps - 5 hold - Seated March  - 1 x daily - 7 x weekly - 2 sets - 10 reps - 5 hold - Seated Heel Raise  - 1 x daily - 7 x weekly - 2 sets - 10 reps - 5 hold  GOALS: Goals reviewed with patient? Yes  SHORT TERM GOALS: Target date: 01/26/2024    Patient will be independent in home exercise program to improve strength/mobility for better functional independence with ADLs.  Baseline: Goal status: INITIAL  LONG TERM GOALS: Target date: 03/22/2024    Patient (< 31 years old) will complete five times sit to stand test in < 10 seconds indicating an increased LE strength and improved balance.  Baseline: 30.53 with 4 posterior LOB 6/25: 14.47 seconds, no posterior LOB, no UE use Goal status: INITIAL  2.   Patient will increase Berg Balance score by >45/56 to demonstrate decreased fall risk during functional activities.  Baseline: 5/5: 26 6/25: 42/56 Goal status: INITIAL  3.    Patient will increase six minute walk test distance to >1200 for progression to age norm community ambulator and improve gait ability  Baseline: perform next session 5/12: 710 ft  6/25: 962ft Goal status: INITIAL  4.  Patient will score > 60/80 on LEFS for improved functional mobility and quality of life.  Baseline:  perform next session 5/12: 43 6/25: 37 Goal status: INITIAL    ASSESSMENT:  CLINICAL IMPRESSION: Pt demonstrated improved cardiovascular endurance, LE strength, and dynamic balance via functional assessments performed today. Pt's LEFS score worsened since previous progress note, but pt has demonstrated improved LE strength in PT. Extensive encouragement provided throughout session to allow  pt to comprehend progress made in PT- pt remains motivated to improve his current condition and participate in therapy. Future sessions to continue with LE endurance and strengthening as well as tolerance to single-leg stance/weight bearing acceptance to improve balance and ambulation. Patient's condition has the potential to improve in response to therapy. Maximum improvement is yet to be obtained. The anticipated improvement is attainable and reasonable in a generally predictable time. Patient will benefit from skilled physical therapy to improve strength, mobility, and stability.   OBJECTIVE IMPAIRMENTS: Abnormal gait, cardiopulmonary status limiting activity, decreased activity tolerance, decreased balance, decreased coordination, decreased endurance, decreased knowledge of use of DME, decreased mobility, difficulty walking, decreased ROM, decreased strength, dizziness, impaired perceived functional ability, impaired flexibility, impaired vision/preception, improper body mechanics, postural dysfunction, and obesity.   ACTIVITY LIMITATIONS: carrying, lifting, bending, sitting, standing, squatting, stairs, transfers, bed mobility, toileting, dressing, reach over head, hygiene/grooming, locomotion level, and caring for others  PARTICIPATION LIMITATIONS: meal prep, cleaning, laundry, driving, shopping, community activity, occupation, and yard work  PERSONAL FACTORS: Age, Behavior pattern, Fitness, Past/current experiences, Time since onset of injury/illness/exacerbation, Transportation, and 3+ comorbidities: DM,  HTN, Anemia, Bells palsy, Hypothyroidism, Kidney stones, stroke and pseudotumour cerebri. Macular degeneration with impaired visual acuity  are also affecting patient's functional outcome.   REHAB POTENTIAL: Good  CLINICAL DECISION MAKING: Evolving/moderate complexity  EVALUATION COMPLEXITY: Moderate  PLAN:  PT FREQUENCY: 2x/week  PT DURATION: 12 weeks  PLANNED INTERVENTIONS: 97164- PT Re-evaluation, 97750- Physical Performance Testing, 97110-Therapeutic exercises, 97530- Therapeutic activity, [id number]- Neuromuscular re-education, 97535- Self Care, [id number]- Manual therapy, [id number]- Gait training, [id number]- Orthotic Initial, [id number]- Orthotic/Prosthetic subsequent, [phone]- Canalith repositioning, [id number]- Splinting, [id number]- Electrical stimulation (unattended), [phone]- Electrical stimulation (manual), [id number]- Vasopneumatic device, [id number]- Ultrasound, [id number]- Traction (mechanical), [id number]- Ionotophoresis 4mg /ml Dexamethasone , Patient/Family education, Balance training, Stair training, Taping, Dry Needling, Joint mobilization, Spinal mobilization, Scar mobilization, Compression bandaging, Vestibular training, Visual/preceptual remediation/compensation, Cognitive remediation, DME instructions, Cryotherapy, Moist heat, and Biofeedback  PLAN FOR NEXT SESSION:   balance, strength, L ankle strength, stairs, LE endurance   Janell Axe, SPT  This entire session was performed under direct supervision and direction of a licensed Estate agent . I have personally read, edited and approve of the note as written.   Marina  Moser, PT 02/18/2024, 12:17 PM

## 2024-02-23 ENCOUNTER — Ambulatory Visit: Payer: Medicare Other

## 2024-02-23 ENCOUNTER — Ambulatory Visit (INDEPENDENT_AMBULATORY_CARE_PROVIDER_SITE_OTHER): Admitting: Internal Medicine

## 2024-02-23 VITALS — BP 136/72 | HR 75 | Resp 16 | Ht 72.0 in | Wt 271.0 lb

## 2024-02-23 DIAGNOSIS — Z992 Dependence on renal dialysis: Secondary | ICD-10-CM

## 2024-02-23 DIAGNOSIS — I779 Disorder of arteries and arterioles, unspecified: Secondary | ICD-10-CM

## 2024-02-23 DIAGNOSIS — E782 Mixed hyperlipidemia: Secondary | ICD-10-CM

## 2024-02-23 DIAGNOSIS — M6281 Muscle weakness (generalized): Secondary | ICD-10-CM | POA: Diagnosis not present

## 2024-02-23 DIAGNOSIS — R262 Difficulty in walking, not elsewhere classified: Secondary | ICD-10-CM

## 2024-02-23 DIAGNOSIS — N186 End stage renal disease: Secondary | ICD-10-CM | POA: Diagnosis not present

## 2024-02-23 DIAGNOSIS — I10 Essential (primary) hypertension: Secondary | ICD-10-CM

## 2024-02-23 DIAGNOSIS — E039 Hypothyroidism, unspecified: Secondary | ICD-10-CM

## 2024-02-23 DIAGNOSIS — E08319 Diabetes mellitus due to underlying condition with unspecified diabetic retinopathy without macular edema: Secondary | ICD-10-CM

## 2024-02-23 DIAGNOSIS — E1122 Type 2 diabetes mellitus with diabetic chronic kidney disease: Secondary | ICD-10-CM

## 2024-02-23 DIAGNOSIS — R2681 Unsteadiness on feet: Secondary | ICD-10-CM

## 2024-02-23 DIAGNOSIS — Z8673 Personal history of transient ischemic attack (TIA), and cerebral infarction without residual deficits: Secondary | ICD-10-CM

## 2024-02-23 DIAGNOSIS — D649 Anemia, unspecified: Secondary | ICD-10-CM | POA: Diagnosis not present

## 2024-02-23 DIAGNOSIS — G629 Polyneuropathy, unspecified: Secondary | ICD-10-CM

## 2024-02-23 DIAGNOSIS — Z794 Long term (current) use of insulin: Secondary | ICD-10-CM

## 2024-02-23 DIAGNOSIS — R2689 Other abnormalities of gait and mobility: Secondary | ICD-10-CM

## 2024-02-23 NOTE — Progress Notes (Signed)
 Subjective:    Patient ID: Gregory Crane, male    DOB: 07/08/1971, 53 y.o.   MRN: [id number]  Patient here for  Chief Complaint  Patient presents with   Medical Management of Chronic Issues    HPI Here for a scheduled follow up - follow up regarding diabetes, hypertension, hypercholesterolemia and hypothyroidism. Has ESRD on dialysis - 2 days per week. Desires not to do more days. Feels his breathing is stable. Weight remaining stable. No chest pain. Eating. No abdominal pain or bowel change. Discussed his desire not to take a lot of medication. Discussed with him the importance of treating his underlying medical issues = ESKD, diabetes, hypertension, cholesterol.    Past Medical History:  Diagnosis Date   Allergy    Anemia    Bell's palsy    Diabetes mellitus without complication (HCC)    diet controlled   Fall 07/2023   Hypertension    Hypothyroidism    Kidney stones    Pseudotumor cerebri    Stroke [facility])    Past Surgical History:  Procedure Laterality Date   COLONOSCOPY WITH PROPOFOL  N/A 03/30/2020   Procedure: COLONOSCOPY WITH PROPOFOL ;  Surgeon: Maryruth Ole DASEN, MD;  Location: [facility] ENDOSCOPY;  Service: Endoscopy;  Laterality: N/A;   LOOP RECORDER INSERTION N/A 01/27/2018   Procedure: LOOP RECORDER INSERTION;  Surgeon: Fernande Elspeth BROCKS, MD;  Location: [facility] INVASIVE CV LAB;  Service: Cardiovascular;  Laterality: N/A;   LUMBAR PUNCTURE     as child   NO PAST SURGERIES     REMOVAL OF A DIALYSIS CATHETER     TEE WITHOUT CARDIOVERSION N/A 01/07/2018   Procedure: TRANSESOPHAGEAL ECHOCARDIOGRAM (TEE);  Surgeon: Perla Evalene PARAS, MD;  Location: [facility] ORS;  Service: Cardiovascular;  Laterality: N/A;   Family History  Problem Relation Age of Onset   Breast cancer Mother    Diabetes Father    Diabetes Sister    Arthritis Maternal Grandmother    Diabetes Maternal Grandmother    Social History   Socioeconomic History   Marital status: Widowed    Spouse name: Clotilda    Number of children: 0   Years of education: Not on file   Highest education level: Not on file  Occupational History   Occupation: disability  Tobacco Use   Smoking status: Never   Smokeless tobacco: Never  Vaping Use   Vaping status: Never Used  Substance and Sexual Activity   Alcohol use: No    Comment: last use early 2023   Drug use: No   Sexual activity: Yes  Other Topics Concern   Not on file  Social History Narrative   Wife deceased 04-16-20, has 2 step children, independent at baseline.   Social Drivers of Health   Financial Resource Strain: Medium Risk (09/12/2023)   Received from [facility]   Overall Financial Resource Strain (CARDIA)    Difficulty of Paying Living Expenses: Somewhat hard  Food Insecurity: No Food Insecurity (12/24/2023)   Hunger Vital Sign    Worried About Running Out of Food in the Last Year: Never true    Ran Out of Food in the Last Year: Never true  Transportation Needs: No Transportation Needs (12/24/2023)   PRAPARE - Administrator, Civil Service (Medical): No    Lack of Transportation (Non-Medical): No  Physical Activity: Insufficiently Active (06/25/2023)   Exercise Vital Sign    Days of Exercise per Week: 2 days    Minutes of Exercise per Session: 40  min  Stress: No Stress Concern Present (07/09/2023)   Received from [facility] of Occupational Health - Occupational Stress Questionnaire    Feeling of Stress : Not at all  Social Connections: Moderately Isolated (06/25/2023)   Social Connection and Isolation Panel    Frequency of Communication with Friends and Family: More than three times a week    Frequency of Social Gatherings with Friends and Family: Twice a week    Attends Religious Services: More than 4 times per year    Active Member of [employer] or Organizations: No    Attends Banker Meetings: Never    Marital Status: Widowed     Review of Systems  Constitutional:  Negative for  appetite change and unexpected weight change.  HENT:  Negative for congestion and sinus pressure.   Respiratory:  Negative for cough and chest tightness.        Breathing stable.   Cardiovascular:  Negative for chest pain and palpitations.       No increased swelling. Stable.   Gastrointestinal:  Negative for abdominal pain, diarrhea, nausea and vomiting.  Genitourinary:  Negative for difficulty urinating and dysuria.  Musculoskeletal:  Negative for joint swelling and myalgias.  Skin:  Negative for color change and rash.  Neurological:  Negative for dizziness and headaches.  Psychiatric/Behavioral:  Negative for agitation and dysphoric mood.        Objective:     BP 136/72   Pulse 75   Resp 16   Ht 6' (1.829 m)   Wt 271 lb (122.9 kg)   SpO2 99%   BMI 36.75 kg/m  Wt Readings from Last 3 Encounters:  02/23/24 271 lb (122.9 kg)  01/30/24 254 lb 12.8 oz (115.6 kg)  12/31/23 258 lb 6.4 oz (117.2 kg)    Physical Exam Vitals reviewed.  Constitutional:      General: He is not in acute distress.    Appearance: Normal appearance. He is well-developed.  HENT:     Head: Normocephalic and atraumatic.     Right Ear: External ear normal.     Left Ear: External ear normal.     Mouth/Throat:     Pharynx: No oropharyngeal exudate or posterior oropharyngeal erythema.  Eyes:     General: No scleral icterus.       Right eye: No discharge.        Left eye: No discharge.     Conjunctiva/sclera: Conjunctivae normal.  Cardiovascular:     Rate and Rhythm: Normal rate and regular rhythm.  Pulmonary:     Effort: Pulmonary effort is normal. No respiratory distress.     Breath sounds: Normal breath sounds.  Abdominal:     General: Bowel sounds are normal.     Palpations: Abdomen is soft.     Tenderness: There is no abdominal tenderness.  Musculoskeletal:        General: No tenderness.     Cervical back: Neck supple. No tenderness.     Comments: No increased swelling - stable.    Lymphadenopathy:     Cervical: No cervical adenopathy.  Skin:    Findings: No erythema or rash.  Neurological:     Mental Status: He is alert.  Psychiatric:        Mood and Affect: Mood normal.        Behavior: Behavior normal.         Outpatient Encounter Medications as of 02/23/2024  Medication Sig   amiodarone  (PACERONE )  200 MG tablet Take 1 tablet (200 mg total) by mouth 2 (two) times daily.   apixaban  (ELIQUIS ) 5 MG TABS tablet Take 1 tablet (5 mg total) by mouth 2 (two) times daily.   diltiazem  (CARDIZEM  CD) 120 MG 24 hr capsule Take 1 capsule (120 mg total) by mouth daily.   dorzolamide-timolol (COSOPT) 2-0.5 % ophthalmic solution Place 1 drop into both eyes 2 (two) times daily.   furosemide  (LASIX ) 40 MG tablet Take 1 tablet (40 mg total) by mouth 2 (two) times daily.   hydrALAZINE  (APRESOLINE ) 25 MG tablet Take 25 mg by mouth 2 (two) times daily.   levothyroxine  (SYNTHROID ) 175 MCG tablet TAKE 1 TABLET (175 MCG) BY MOUTH ONCE DAILY BEFORE BREAKFAST.   metoprolol  tartrate 37.5 MG TABS Take 1 tablet (37.5 mg total) by mouth 2 (two) times daily.   pregabalin  (LYRICA ) 50 MG capsule 1 -2 tablets q hs   RENVELA  800 MG tablet Take 800 mg by mouth 3 (three) times daily.   rosuvastatin  (CRESTOR ) 5 MG tablet Take 5 mg by mouth daily.   No facility-administered encounter medications on file as of 02/23/2024.     Lab Results  Component Value Date   WBC 5.3 12/26/2023   HGB 11.3 (L) 12/26/2023   HCT 33.1 (L) 12/26/2023   PLT 168 12/26/2023   GLUCOSE 120 (H) 12/26/2023   CHOL 129 10/09/2023   TRIG 146.0 10/09/2023   HDL 34.10 (L) 10/09/2023   LDLDIRECT 107.0 06/26/2021   LDLCALC 66 10/09/2023   ALT 13 12/24/2023   AST 16 12/24/2023   NA 136 12/26/2023   K 3.9 12/26/2023   CL 97 (L) 12/26/2023   CREATININE 7.01 (H) 12/26/2023   BUN 59 (H) 12/26/2023   CO2 23 12/26/2023   TSH 17.12 (A) 02/05/2024   PSA 0.48 10/04/2022   INR 1.3 (H) 08/05/2023   HGBA1C 6.2 (H)  12/24/2023    ECHOCARDIOGRAM COMPLETE Result Date: 12/25/2023    ECHOCARDIOGRAM REPORT   Patient Name:   [facility] Date of Exam: 12/25/2023 Medical Rec #:  [id number]      Height:       72.0 in Accession #:    [id number]     Weight:       252.9 lb Date of Birth:  1970-11-21     BSA:          2.354 m Patient Age:    52 years       BP:           143/82 mmHg Patient Gender: M              HR:           60 bpm. Exam Location:  [facility] Procedure: 2D Echo, Cardiac Doppler and Color Doppler (Both Spectral and Color            Flow Doppler were utilized during procedure). Indications:     Acute respiratory distress R06.03  History:         Patient has prior history of Echocardiogram examinations, most                  recent 07/02/2023. Stroke; Risk Factors:Diabetes and                  Hypertension.  Sonographer:     Christopher Furnace Referring Phys:  [phone] CHRISTOPHER END Diagnosing Phys: Timothy Gollan MD IMPRESSIONS  1. Left ventricular ejection fraction, by estimation, is 60 to 65%. The  left ventricle has normal function. The left ventricle has no regional wall motion abnormalities. There is mild left ventricular hypertrophy. Left ventricular diastolic parameters were normal.  2. Right ventricular systolic function is normal. The right ventricular size is normal.  3. The mitral valve is normal in structure. Mild mitral valve regurgitation. No evidence of mitral stenosis.  4. The aortic valve is tricuspid. There is mild calcification of the aortic valve. Aortic valve regurgitation is not visualized. Aortic valve sclerosis is present, with no evidence of aortic valve stenosis.  5. The inferior vena cava is normal in size with greater than 50% respiratory variability, suggesting right atrial pressure of 3 mmHg. FINDINGS  Left Ventricle: Left ventricular ejection fraction, by estimation, is 60 to 65%. The left ventricle has normal function. The left ventricle has no regional wall motion abnormalities. Strain was performed and  the global longitudinal strain is indeterminate. The left ventricular internal cavity size was normal in size. There is mild left ventricular hypertrophy. Left ventricular diastolic parameters were normal. Right Ventricle: The right ventricular size is normal. No increase in right ventricular wall thickness. Right ventricular systolic function is normal. Left Atrium: Left atrial size was normal in size. Right Atrium: Right atrial size was normal in size. Pericardium: There is no evidence of pericardial effusion. Mitral Valve: The mitral valve is normal in structure. Mild mitral valve regurgitation. No evidence of mitral valve stenosis. MV peak gradient, 8.0 mmHg. The mean mitral valve gradient is 3.0 mmHg. Tricuspid Valve: The tricuspid valve is normal in structure. Tricuspid valve regurgitation is not demonstrated. No evidence of tricuspid stenosis. Aortic Valve: The aortic valve is tricuspid. There is mild calcification of the aortic valve. Aortic valve regurgitation is not visualized. Aortic valve sclerosis is present, with no evidence of aortic valve stenosis. Aortic valve mean gradient measures 4.0 mmHg. Aortic valve peak gradient measures 6.9 mmHg. Aortic valve area, by VTI measures 2.51 cm. Pulmonic Valve: The pulmonic valve was normal in structure. Pulmonic valve regurgitation is not visualized. No evidence of pulmonic stenosis. Aorta: The aortic root is normal in size and structure. Venous: The inferior vena cava is normal in size with greater than 50% respiratory variability, suggesting right atrial pressure of 3 mmHg. IAS/Shunts: No atrial level shunt detected by color flow Doppler. Additional Comments: 3D was performed not requiring image post processing on an independent workstation and was indeterminate.  LEFT VENTRICLE PLAX 2D LVIDd:         4.40 cm   Diastology LVIDs:         2.70 cm   LV e' medial:    6.09 cm/s LV PW:         1.20 cm   LV E/e' medial:  21.7 LV IVS:        1.60 cm   LV e' lateral:    6.85 cm/s LVOT diam:     2.20 cm   LV E/e' lateral: 19.3 LV SV:         81 LV SV Index:   34 LVOT Area:     3.80 cm  RIGHT VENTRICLE RV Basal diam:  3.70 cm RV Mid diam:    3.00 cm LEFT ATRIUM           Index        RIGHT ATRIUM           Index LA diam:      3.00 cm 1.27 cm/m   RA Area:     18.60 cm LA  Vol [facility]): 54.5 ml 23.15 ml/m  RA Volume:   47.40 ml  20.14 ml/m LA Vol (A4C): 66.9 ml 28.42 ml/m  AORTIC VALVE AV Area (Vmax):    2.59 cm AV Area (Vmean):   2.30 cm AV Area (VTI):     2.51 cm AV Vmax:           131.00 cm/s AV Vmean:          97.100 cm/s AV VTI:            0.321 m AV Peak Grad:      6.9 mmHg AV Mean Grad:      4.0 mmHg LVOT Vmax:         89.40 cm/s LVOT Vmean:        58.700 cm/s LVOT VTI:          0.212 m LVOT/AV VTI ratio: 0.66  AORTA Ao Root diam: 2.80 cm MITRAL VALVE                TRICUSPID VALVE MV Area (PHT): 4.08 cm     TR Peak grad:   28.3 mmHg MV Area VTI:   2.65 cm     TR Vmax:        266.00 cm/s MV Peak grad:  8.0 mmHg MV Mean grad:  3.0 mmHg     SHUNTS MV Vmax:       1.41 m/s     Systemic VTI:  0.21 m MV Vmean:      73.3 cm/s    Systemic Diam: 2.20 cm MV Decel Time: 186 msec MV E velocity: 132.00 cm/s MV A velocity: 79.70 cm/s MV E/A ratio:  1.66 Evalene Lunger MD Electronically signed by Evalene Lunger MD Signature Date/Time: 12/25/2023/3:06:16 PM    Final        Assessment & Plan:  Anemia, unspecified type Assessment & Plan: Noted to have decreased hgb in hospital. CBC being followed at dialysis. Obtain lab results.    Bilateral carotid artery disease, unspecified type (HCC) Assessment & Plan: Last carotid ultrasound - <50% bilaterally.  Needs to take statin and keep blood pressure under control. Continue crestor . Blood pressure as outlined.    Diabetic retinopathy associated with diabetes mellitus due to underlying condition, macular edema presence unspecified, unspecified laterality, unspecified retinopathy severity (HCC) Assessment & Plan: Sees endocrinology.  Low carb diet.  Follow up with ophthalmology.    ESRD on hemodialysis [facility]) Assessment & Plan: Continue hemodialysis.  Followed by nephrology. Receiving dialysis two days per week. They are following labs. Try to continue to obtain copies of labs to not duplicate.    History of CVA (cerebrovascular accident) Assessment & Plan: History of CVA with previous left side weakness. Per review, on eliquis . Continue blood pressure control. Continue eliquis .    Mixed hyperlipidemia Assessment & Plan: Continue crestor . Low cholesterol diet and exercise. Follow lipid panel.    Type 2 diabetes mellitus with chronic kidney disease on chronic dialysis, with long-term current use of insulin  (HCC) Assessment & Plan: Low carb diet and exercise. Continue f/u with endocrinology. No change in medication today.    Neuropathy Assessment & Plan: Continues on lyrica .    Acquired hypothyroidism Assessment & Plan: On thyroid  replacement. Not sure if he is taking amiodarone . Need to confirm. Just adjusted thyroid  dose. Follow tsh.    Primary hypertension Assessment & Plan: Blood pressure as outlined.  Continue hydralazine , cardizem  and metoprolol . Follow pressures. Labs being followed by nephrology.  Allena Hamilton, MD

## 2024-02-23 NOTE — Therapy (Signed)
 OUTPATIENT PHYSICAL THERAPY NEURO TREATMENT   Patient Name: Gregory Crane MRN: [id number] DOB:1970/09/21, 53 y.o., male Today's Date: 02/23/2024   PCP: Glendia Shad  REFERRING PROVIDER: Glendia Shad  END OF SESSION:  PT End of Session - 02/23/24 0930     Visit Number 11    Number of Visits 24    Date for PT Re-Evaluation 03/22/24    PT Start Time 0931    PT Stop Time 1012    PT Time Calculation (min) 41 min    Equipment Utilized During Treatment Gait belt    Activity Tolerance Patient tolerated treatment well    Behavior During Therapy [facility] for tasks assessed/performed                  Past Medical History:  Diagnosis Date   Allergy    Anemia    Bell's palsy    Diabetes mellitus without complication (HCC)    diet controlled   Fall 07/2023   Hypertension    Hypothyroidism    Kidney stones    Pseudotumor cerebri    Stroke [facility])    Past Surgical History:  Procedure Laterality Date   COLONOSCOPY WITH PROPOFOL  N/A 03/30/2020   Procedure: COLONOSCOPY WITH PROPOFOL ;  Surgeon: Maryruth Ole DASEN, MD;  Location: [facility] ENDOSCOPY;  Service: Endoscopy;  Laterality: N/A;   LOOP RECORDER INSERTION N/A 01/27/2018   Procedure: LOOP RECORDER INSERTION;  Surgeon: Fernande Elspeth BROCKS, MD;  Location: [facility] INVASIVE CV LAB;  Service: Cardiovascular;  Laterality: N/A;   LUMBAR PUNCTURE     as child   NO PAST SURGERIES     REMOVAL OF A DIALYSIS CATHETER     TEE WITHOUT CARDIOVERSION N/A 01/07/2018   Procedure: TRANSESOPHAGEAL ECHOCARDIOGRAM (TEE);  Surgeon: Perla Evalene PARAS, MD;  Location: [facility] ORS;  Service: Cardiovascular;  Laterality: N/A;   Patient Active Problem List   Diagnosis Date Noted   Left hip pain 01/30/2024   Acute on chronic heart failure with preserved ejection fraction (HFpEF) (HCC) 12/26/2023   Community acquired pneumonia 12/24/2023   Acute respiratory failure with hypoxia (HCC) 12/23/2023   SVT (supraventricular tachycardia) (HCC) 08/10/2023    Atrial fibrillation with rapid ventricular response (HCC) 07/03/2023   Atrial fibrillation with RVR (HCC) 07/02/2023   Fall 06/15/2023   Non-compliance with renal dialysis (HCC) 05/02/2023   Obesity (BMI 30-39.9) 04/30/2023   Volume overload 04/29/2023   Renal osteodystrophy 11/25/2022   Unsteady gait 10/05/2022   Steal syndrome of dialysis vascular access (HCC) 05/28/2022   Hydronephrosis, left 05/04/2022   Leukocytosis 05/04/2022   Chest pain 01/13/2022   Diabetic retinopathy associated with diabetes mellitus due to underlying condition (HCC) 01/13/2022   ESRD on hemodialysis (HCC) 01/13/2022   Deafness in right ear 08/11/2021   Open wound 01/25/2021   History of colon polyps 10/15/2020   Acute cholecystitis without calculus 09/09/2020   Type 2 diabetes mellitus, with long-term current use of insulin  (HCC) 09/09/2020   Cryptogenic stroke (HCC) 07/13/2020   History of loop recorder 07/13/2020   Elevated troponin 04/03/2020   Acquired trigger finger 06/08/2019   Lymphedema 06/08/2019   Anemia 04/17/2019   Swelling of both lower extremities 01/10/2019   Facial droop 04/09/2018   Daytime somnolence 03/30/2018   Carotid artery disease (HCC) 02/03/2018   Intracranial vascular stenosis 09/12/2017   Cough 01/20/2017   Bell's palsy 11/10/2016   History of CVA (cerebrovascular accident) 11/10/2016   Benign localized hyperplasia of prostate with urinary obstruction 10/27/2016   History of  nephrolithiasis 10/27/2016   TIA (transient ischemic attack) 10/20/2016   Near syncope 06/23/2016   Organic impotence 10/01/2015   Neuropathy 08/06/2015   Health care maintenance 08/06/2015   Hypertension 08/06/2015   Hypothyroidism 10/10/2013   Microalbuminuria 10/10/2013   Hyperlipidemia 10/10/2013   B12 deficiency 10/10/2013   Environmental allergies 07/04/2013    ONSET DATE: 3 years ago  REFERRING DIAG: unsteady gait  THERAPY DIAG:  Muscle weakness (generalized)  Unsteadiness on  feet  Other abnormalities of gait and mobility  Difficulty in walking, not elsewhere classified  Rationale for Evaluation and Treatment: Rehabilitation  SUBJECTIVE:                                                                                                                                                                                             SUBJECTIVE STATEMENT: Pt reports that he is feeling okay. He states that he felt fine after previous session.    Pt accompanied by: self  PERTINENT HISTORY: Patient returning to PT s/p hospitalization for pneumonia, , SVT, ESRD on hypodialysis. PMH DM, HTN, Anemia, Bells palsy, Hypothyroidism, Kidney stones, stroke and pseudotumour cerebri. Macular degeneration with impaired visual acuity   PAIN:  Are you having pain? Some L foot numbness/neuropathy  PRECAUTIONS: Fall  RED FLAGS: None   WEIGHT BEARING RESTRICTIONS: No  FALLS: Has patient fallen in last 6 months? No  LIVING ENVIRONMENT: Lives with: lives alone Lives in: House/apartment Stairs: yes stairs in house Has following equipment at home: Single point cane  PLOF: Independent with basic ADLs  PATIENT GOALS: balance, strength  OBJECTIVE:  Note: Objective measures were completed at Evaluation unless otherwise noted.  DIAGNOSTIC FINDINGS: IMPRESSION: Interval resolution of previously noted trace left parafalcine subdural hematoma. No acute intracranial process.  COGNITION: Overall cognitive status: Within functional limits for tasks assessed   SENSATION: WFL  COORDINATION: Heel slide: WFL RLE; slight decrease with LLE   MUSCLE TONE: WFL  POSTURE: rounded shoulders, forward head, and posterior pelvic tilt  LOWER EXTREMITY ROM:     Active  Right Eval Left Eval  Hip flexion    Hip extension    Hip abduction    Hip adduction    Hip internal rotation    Hip external rotation    Knee flexion    Knee extension    Ankle dorsiflexion  -3  Ankle  plantarflexion  35  Ankle inversion  10  Ankle eversion  8   (Blank rows = not tested)  LOWER EXTREMITY MMT:    MMT Right Eval Left Eval  Hip flexion 4 4  Hip extension    Hip abduction  3 3  Hip adduction 3+ 3+  Hip internal rotation    Hip external rotation    Knee flexion 4- 4-  Knee extension 4 4  Ankle dorsiflexion 3 2+  Ankle plantarflexion 3+ 3  Ankle inversion    Ankle eversion    (Blank rows = not tested)  BED MOBILITY:  Not tested  TRANSFERS: Sit to stand: CGA  Assistive device utilized: None     Stand to sit: CGA  Assistive device utilized: None     Chair to chair: CGA  Assistive device utilized: None       RAMP:  Not tested  CURB:  Findings: needs UE support to step up  STAIRS: heavy BUE support step over step negotiation.  GAIT: Findings: Gait Characteristics: decreased stride length, Left foot flat, wide BOS, and poor foot clearance- Left, Distance walked: 60 ft, Assistive device utilized:None, Level of assistance: CGA, and Comments: increased L ankle rolling due to not wearing AFO  FUNCTIONAL TESTS:  5 times sit to stand: 30.53 with 4 posterior LOB 6 minute walk test: perform next session  Berg Balance Scale: 26  PATIENT SURVEYS:  LEFS give next session                                                                                                                               TREATMENT DATE: 02/23/24  TherEx: Standing hedgehog toe taps at support bar 2x10 BLE - Round 1: alternating feet x10 - Round 2: Alternating feet, tap both hedgehogs x10  Weighted ambulation in hallway with 2# AW; 16ft x 2 laps  Seated: Marches with 3# AW 2x10 both LE  LAQ with 3# AW 2x10 each LE   GTB HS curl 2x10 both LE  Toe taps over orange hurdle   Neuro Re-ed:  Activity Description: 5 pods in a circle with 1 in the middle as home base Activity Setting:  The [facility] setting was selected for the goal of improving spatial awareness and  visual scanning ability, establishing a central point of reference during dynamic movements for enhanced balance and control.  Number of Pods:  6 Cycles/Sets:  3 Duration (Time or Hit Count):  1:00  Patient Stats  Hits:   Round 1:6 hits  Round 2: 5 hits Round 3: 7 hits  Side stepping with Airex pad on both sides of 6 step 2x10 both directions at support bar  Forward stepping with Airex pad on both sides of 6 step at support bar x10    PATIENT EDUCATION: Education details: goals, POC, HEP Person educated: Patient Education method: Explanation, Demonstration, Tactile cues, Verbal cues, and Handouts Education comprehension: verbalized understanding, returned demonstration, verbal cues required, tactile cues required, and needs further education  HOME EXERCISE PROGRAM: Access Code: [email or web address] URL: [email or web address] Date: 12/29/2023 Prepared by: Marina  Moser  Exercises - Leg Extension  - 1 x daily - 7 x weekly - 2 sets - 10 reps -  5 hold - Seated March  - 1 x daily - 7 x weekly - 2 sets - 10 reps - 5 hold - Seated Heel Raise  - 1 x daily - 7 x weekly - 2 sets - 10 reps - 5 hold  GOALS: Goals reviewed with patient? Yes  SHORT TERM GOALS: Target date: 01/26/2024    Patient will be independent in home exercise program to improve strength/mobility for better functional independence with ADLs.  Baseline: Goal status: INITIAL  LONG TERM GOALS: Target date: 03/22/2024    Patient (< 51 years old) will complete five times sit to stand test in < 10 seconds indicating an increased LE strength and improved balance.  Baseline: 30.53 with 4 posterior LOB Goal status: INITIAL  2.   Patient will increase Berg Balance score by >45/56 to demonstrate decreased fall risk during functional activities.  Baseline: 5/5: 26  Goal status: INITIAL  3.    Patient will increase six minute walk test distance to >1200 for progression to age norm community ambulator and improve gait  ability  Baseline: perform next session 5/12: 710 ft  Goal status: INITIAL  4.  Patient will score > 60/80 on LEFS for improved functional mobility and quality of life.  Baseline: perform next session 5/12: 43 Goal status: INITIAL    ASSESSMENT:  CLINICAL IMPRESSION: Pt tolerated progressions in LE strengthening and balance today. Pt demonstrated increased L foot slap during weighted ambulation and instances of stumbles with Blaze pod acitvity. Pt able to recover from stumbles independently, but close CGA with gait belt provided throughout session. Pt also demonstrated improved spatial awareness with forward/side stepping today, with minimal instances of L foot catching. Patient will benefit from skilled physical therapy to improve strength, mobility, and stability.   OBJECTIVE IMPAIRMENTS: Abnormal gait, cardiopulmonary status limiting activity, decreased activity tolerance, decreased balance, decreased coordination, decreased endurance, decreased knowledge of use of DME, decreased mobility, difficulty walking, decreased ROM, decreased strength, dizziness, impaired perceived functional ability, impaired flexibility, impaired vision/preception, improper body mechanics, postural dysfunction, and obesity.   ACTIVITY LIMITATIONS: carrying, lifting, bending, sitting, standing, squatting, stairs, transfers, bed mobility, toileting, dressing, reach over head, hygiene/grooming, locomotion level, and caring for others  PARTICIPATION LIMITATIONS: meal prep, cleaning, laundry, driving, shopping, community activity, occupation, and yard work  PERSONAL FACTORS: Age, Behavior pattern, Fitness, Past/current experiences, Time since onset of injury/illness/exacerbation, Transportation, and 3+ comorbidities: DM, HTN, Anemia, Bells palsy, Hypothyroidism, Kidney stones, stroke and pseudotumour cerebri. Macular degeneration with impaired visual acuity  are also affecting patient's functional outcome.   REHAB  POTENTIAL: Good  CLINICAL DECISION MAKING: Evolving/moderate complexity  EVALUATION COMPLEXITY: Moderate  PLAN:  PT FREQUENCY: 2x/week  PT DURATION: 12 weeks  PLANNED INTERVENTIONS: 97164- PT Re-evaluation, 97750- Physical Performance Testing, 97110-Therapeutic exercises, 97530- Therapeutic activity, [id number]- Neuromuscular re-education, 97535- Self Care, [id number]- Manual therapy, [id number]- Gait training, [id number]- Orthotic Initial, [id number]- Orthotic/Prosthetic subsequent, [phone]- Canalith repositioning, [id number]- Splinting, H9716- Electrical stimulation (unattended), [id number]- Electrical stimulation (manual), [id number]- Vasopneumatic device, [id number]- Ultrasound, [id number]- Traction (mechanical), [id number]- Ionotophoresis 4mg /ml Dexamethasone , Patient/Family education, Balance training, Stair training, Taping, Dry Needling, Joint mobilization, Spinal mobilization, Scar mobilization, Compression bandaging, Vestibular training, Visual/preceptual remediation/compensation, Cognitive remediation, DME instructions, Cryotherapy, Moist heat, and Biofeedback  PLAN FOR NEXT SESSION:   balance, strength, L ankle strength, stairs, LE endurance   Janell Axe, SPT  This entire session was performed under direct supervision and direction of a licensed Estate agent . I have personally read, edited and approve of the note as written.  Marina  Moser, PT 02/23/2024, 11:06 AM

## 2024-02-25 ENCOUNTER — Ambulatory Visit: Payer: Medicare Other

## 2024-02-29 ENCOUNTER — Encounter: Payer: Self-pay | Admitting: Internal Medicine

## 2024-02-29 NOTE — Assessment & Plan Note (Signed)
 On thyroid  replacement. Not sure if he is taking amiodarone . Need to confirm. Just adjusted thyroid  dose. Follow tsh.

## 2024-02-29 NOTE — Assessment & Plan Note (Signed)
 History of CVA with previous left side weakness. Per review, on eliquis . Continue blood pressure control. Continue eliquis .

## 2024-02-29 NOTE — Assessment & Plan Note (Signed)
 Last carotid ultrasound - <50% bilaterally.  Needs to take statin and keep blood pressure under control. Continue crestor . Blood pressure as outlined.

## 2024-02-29 NOTE — Assessment & Plan Note (Signed)
 Low carb diet and exercise. Continue f/u with endocrinology. No change in medication today.

## 2024-02-29 NOTE — Assessment & Plan Note (Signed)
 Sees endocrinology. Low carb diet.  Follow up with ophthalmology.

## 2024-02-29 NOTE — Assessment & Plan Note (Signed)
 Continues on lyrica

## 2024-02-29 NOTE — Assessment & Plan Note (Signed)
Continue crestor.  Low cholesterol diet and exercise.  Follow lipid panel.  

## 2024-02-29 NOTE — Assessment & Plan Note (Signed)
 Noted to have decreased hgb in hospital. CBC being followed at dialysis. Obtain lab results.

## 2024-02-29 NOTE — Assessment & Plan Note (Signed)
 Blood pressure as outlined.  Continue hydralazine , cardizem  and metoprolol . Follow pressures.  Labs being followed by nephrology.

## 2024-02-29 NOTE — Assessment & Plan Note (Signed)
 Continue hemodialysis.  Followed by nephrology. Receiving dialysis two days per week. They are following labs. Try to continue to obtain copies of labs to not duplicate.

## 2024-03-01 ENCOUNTER — Ambulatory Visit: Payer: Medicare Other | Attending: Internal Medicine

## 2024-03-01 DIAGNOSIS — M6281 Muscle weakness (generalized): Secondary | ICD-10-CM | POA: Insufficient documentation

## 2024-03-01 DIAGNOSIS — R262 Difficulty in walking, not elsewhere classified: Secondary | ICD-10-CM | POA: Insufficient documentation

## 2024-03-01 DIAGNOSIS — R269 Unspecified abnormalities of gait and mobility: Secondary | ICD-10-CM | POA: Insufficient documentation

## 2024-03-01 DIAGNOSIS — R2681 Unsteadiness on feet: Secondary | ICD-10-CM | POA: Insufficient documentation

## 2024-03-01 DIAGNOSIS — R2689 Other abnormalities of gait and mobility: Secondary | ICD-10-CM | POA: Insufficient documentation

## 2024-03-01 DIAGNOSIS — R278 Other lack of coordination: Secondary | ICD-10-CM | POA: Diagnosis present

## 2024-03-01 NOTE — Therapy (Signed)
 OUTPATIENT PHYSICAL THERAPY NEURO TREATMENT   Patient Name: Gregory Crane MRN: [id number] DOB:October 06, 1970, 53 y.o., male Today's Date: 03/01/2024   PCP: Glendia Shad  REFERRING PROVIDER: Glendia Shad  END OF SESSION:  PT End of Session - 03/01/24 0935     Visit Number 12    Number of Visits 24    Date for PT Re-Evaluation 03/22/24    Authorization - Number of Visits --    Progress Note Due on Visit --    PT Start Time 0932    PT Stop Time 1015    PT Time Calculation (min) 43 min    Equipment Utilized During Treatment Gait belt    Activity Tolerance Patient tolerated treatment well    Behavior During Therapy [facility] for tasks assessed/performed           Past Medical History:  Diagnosis Date   Allergy    Anemia    Bell's palsy    Diabetes mellitus without complication (HCC)    diet controlled   Fall 07/2023   Hypertension    Hypothyroidism    Kidney stones    Pseudotumor cerebri    Stroke [facility])    Past Surgical History:  Procedure Laterality Date   COLONOSCOPY WITH PROPOFOL  N/A 03/30/2020   Procedure: COLONOSCOPY WITH PROPOFOL ;  Surgeon: Maryruth Ole DASEN, MD;  Location: [facility] ENDOSCOPY;  Service: Endoscopy;  Laterality: N/A;   LOOP RECORDER INSERTION N/A 01/27/2018   Procedure: LOOP RECORDER INSERTION;  Surgeon: Fernande Elspeth BROCKS, MD;  Location: [facility] INVASIVE CV LAB;  Service: Cardiovascular;  Laterality: N/A;   LUMBAR PUNCTURE     as child   NO PAST SURGERIES     REMOVAL OF A DIALYSIS CATHETER     TEE WITHOUT CARDIOVERSION N/A 01/07/2018   Procedure: TRANSESOPHAGEAL ECHOCARDIOGRAM (TEE);  Surgeon: Perla Evalene PARAS, MD;  Location: [facility] ORS;  Service: Cardiovascular;  Laterality: N/A;   Patient Active Problem List   Diagnosis Date Noted   Left hip pain 01/30/2024   Acute on chronic heart failure with preserved ejection fraction (HFpEF) (HCC) 12/26/2023   Community acquired pneumonia 12/24/2023   Acute respiratory failure with hypoxia (HCC) 12/23/2023    SVT (supraventricular tachycardia) (HCC) 08/10/2023   Atrial fibrillation with rapid ventricular response (HCC) 07/03/2023   Atrial fibrillation with RVR (HCC) 07/02/2023   Fall 06/15/2023   Non-compliance with renal dialysis (HCC) 05/02/2023   Obesity (BMI 30-39.9) 04/30/2023   Volume overload 04/29/2023   Renal osteodystrophy 11/25/2022   Unsteady gait 10/05/2022   Steal syndrome of dialysis vascular access (HCC) 05/28/2022   Hydronephrosis, left 05/04/2022   Leukocytosis 05/04/2022   Chest pain 01/13/2022   Diabetic retinopathy associated with diabetes mellitus due to underlying condition (HCC) 01/13/2022   ESRD on hemodialysis (HCC) 01/13/2022   Deafness in right ear 08/11/2021   Open wound 01/25/2021   History of colon polyps 10/15/2020   Acute cholecystitis without calculus 09/09/2020   Type 2 diabetes mellitus, with long-term current use of insulin  (HCC) 09/09/2020   Cryptogenic stroke (HCC) 07/13/2020   History of loop recorder 07/13/2020   Elevated troponin 04/03/2020   Acquired trigger finger 06/08/2019   Lymphedema 06/08/2019   Anemia 04/17/2019   Swelling of both lower extremities 01/10/2019   Facial droop 04/09/2018   Daytime somnolence 03/30/2018   Carotid artery disease (HCC) 02/03/2018   Intracranial vascular stenosis 09/12/2017   Cough 01/20/2017   Bell's palsy 11/10/2016   History of CVA (cerebrovascular accident) 11/10/2016   Benign localized  hyperplasia of prostate with urinary obstruction 10/27/2016   History of nephrolithiasis 10/27/2016   TIA (transient ischemic attack) 10/20/2016   Near syncope 06/23/2016   Organic impotence 10/01/2015   Neuropathy 08/06/2015   Health care maintenance 08/06/2015   Hypertension 08/06/2015   Hypothyroidism 10/10/2013   Microalbuminuria 10/10/2013   Hyperlipidemia 10/10/2013   B12 deficiency 10/10/2013   Environmental allergies 07/04/2013    ONSET DATE: 3 years ago  REFERRING DIAG: unsteady gait  THERAPY DIAG:   Muscle weakness (generalized)  Unsteadiness on feet  Other abnormalities of gait and mobility  Difficulty in walking, not elsewhere classified  Abnormality of gait and mobility  Other lack of coordination  Rationale for Evaluation and Treatment: Rehabilitation  SUBJECTIVE:                                                                                                                                                                                             SUBJECTIVE STATEMENT:  Pt reports he had an uneventful July 4th.  Pt reports he feels as though the strengthening portion of therapy has been progressing well, specifically with getting up/down from a chair.  Pt accompanied by: self  PERTINENT HISTORY: Patient returning to PT s/p hospitalization for pneumonia, , SVT, ESRD on hypodialysis. PMH DM, HTN, Anemia, Bells palsy, Hypothyroidism, Kidney stones, stroke and pseudotumour cerebri. Macular degeneration with impaired visual acuity   PAIN:  Are you having pain? Some L foot numbness/neuropathy  PRECAUTIONS: Fall  RED FLAGS: None   WEIGHT BEARING RESTRICTIONS: No  FALLS: Has patient fallen in last 6 months? No  LIVING ENVIRONMENT: Lives with: lives alone Lives in: House/apartment Stairs: yes stairs in house Has following equipment at home: Single point cane  PLOF: Independent with basic ADLs  PATIENT GOALS: balance, strength  OBJECTIVE:  Note: Objective measures were completed at Evaluation unless otherwise noted.  DIAGNOSTIC FINDINGS: IMPRESSION: Interval resolution of previously noted trace left parafalcine subdural hematoma. No acute intracranial process.  COGNITION: Overall cognitive status: Within functional limits for tasks assessed   SENSATION: WFL  COORDINATION: Heel slide: WFL RLE; slight decrease with LLE   MUSCLE TONE: WFL  POSTURE: rounded shoulders, forward head, and posterior pelvic tilt  LOWER EXTREMITY ROM:     Active   Right Eval Left Eval  Hip flexion    Hip extension    Hip abduction    Hip adduction    Hip internal rotation    Hip external rotation    Knee flexion    Knee extension    Ankle dorsiflexion  -3  Ankle plantarflexion  35  Ankle inversion  10  Ankle eversion  8   (Blank rows = not tested)  LOWER EXTREMITY MMT:    MMT Right Eval Left Eval  Hip flexion 4 4  Hip extension    Hip abduction 3 3  Hip adduction 3+ 3+  Hip internal rotation    Hip external rotation    Knee flexion 4- 4-  Knee extension 4 4  Ankle dorsiflexion 3 2+  Ankle plantarflexion 3+ 3  Ankle inversion    Ankle eversion    (Blank rows = not tested)  BED MOBILITY:  Not tested  TRANSFERS: Sit to stand: CGA  Assistive device utilized: None     Stand to sit: CGA  Assistive device utilized: None     Chair to chair: CGA  Assistive device utilized: None       RAMP:  Not tested  CURB:  Findings: needs UE support to step up  STAIRS: heavy BUE support step over step negotiation.  GAIT: Findings: Gait Characteristics: decreased stride length, Left foot flat, wide BOS, and poor foot clearance- Left, Distance walked: 60 ft, Assistive device utilized:None, Level of assistance: CGA, and Comments: increased L ankle rolling due to not wearing AFO  FUNCTIONAL TESTS:  5 times sit to stand: 30.53 with 4 posterior LOB 6 minute walk test: perform next session  Berg Balance Scale: 26  PATIENT SURVEYS:  LEFS give next session                                                                                                                               TREATMENT DATE: 03/01/24  TherEx:  Seated marches, 4# AW donned, 2x10 each LE  Seated LAQ, 4# AW donned, 2x10 each LE  Seated step up/over hedgehogs, 4# AW donned, 2x10 each LE  Seated hamstring curls, GTB, 2x10 each LE  Weighted ambulation in hallway with 4# AW; 159ft x 2 laps  STS, x10 with several posterior stumbles, however is able to correct for  majority of the attempts.    Standing hip extensions 4# AW donned, 2x10 each LE  Standing marches with 4# AW donned, 2x10 each LE  Weighted ambulation in hallway with 4# AW; 152ft x 2 laps (2nd attempt)  Lateral crab-walks length of room ~40' each direction,      PATIENT EDUCATION: Education details: goals, POC, HEP Person educated: Patient Education method: Explanation, Demonstration, Tactile cues, Verbal cues, and Handouts Education comprehension: verbalized understanding, returned demonstration, verbal cues required, tactile cues required, and needs further education  HOME EXERCISE PROGRAM: Access Code: [email or web address] URL: [email or web address] Date: 12/29/2023 Prepared by: Marina  Moser  Exercises - Leg Extension  - 1 x daily - 7 x weekly - 2 sets - 10 reps - 5 hold - Seated March  - 1 x daily - 7 x weekly - 2 sets - 10 reps - 5 hold - Seated Heel Raise  - 1 x daily - 7 x weekly - 2 sets - 10 reps -  5 hold  GOALS: Goals reviewed with patient? Yes  SHORT TERM GOALS: Target date: 01/26/2024  Patient will be independent in home exercise program to improve strength/mobility for better functional independence with ADLs.  Baseline: Goal status: INITIAL   LONG TERM GOALS: Target date: 03/22/2024  Patient (< 66 years old) will complete five times sit to stand test in < 10 seconds indicating an increased LE strength and improved balance.  Baseline: 30.53 with 4 posterior LOB Goal status: INITIAL  2.   Patient will increase Berg Balance score by >45/56 to demonstrate decreased fall risk during functional activities.  Baseline: 5/5: 26  Goal status: INITIAL  3.    Patient will increase six minute walk test distance to >1200 for progression to age norm community ambulator and improve gait ability  Baseline: perform next session 5/12: 710 ft  Goal status: INITIAL  4.  Patient will score > 60/80 on LEFS for improved functional mobility and quality of life.  Baseline:  perform next session 5/12: 43 Goal status: INITIAL    ASSESSMENT:  CLINICAL IMPRESSION:  Pt put forth great effort throughout the session, with ability to perform exercises with increased weight.  Pt ultimately still experiencing posterior bias at times when performing STS's, however is able to correct the majority of them, noting only one instance of OB where he had to sit in the chair.  Pt to continue to improve overall tolerance to exercise and would benefit from continued guidance towards gym-based activities.   Pt will continue to benefit from skilled therapy to address remaining deficits in order to improve overall QoL and return to PLOF.     OBJECTIVE IMPAIRMENTS: Abnormal gait, cardiopulmonary status limiting activity, decreased activity tolerance, decreased balance, decreased coordination, decreased endurance, decreased knowledge of use of DME, decreased mobility, difficulty walking, decreased ROM, decreased strength, dizziness, impaired perceived functional ability, impaired flexibility, impaired vision/preception, improper body mechanics, postural dysfunction, and obesity.   ACTIVITY LIMITATIONS: carrying, lifting, bending, sitting, standing, squatting, stairs, transfers, bed mobility, toileting, dressing, reach over head, hygiene/grooming, locomotion level, and caring for others  PARTICIPATION LIMITATIONS: meal prep, cleaning, laundry, driving, shopping, community activity, occupation, and yard work  PERSONAL FACTORS: Age, Behavior pattern, Fitness, Past/current experiences, Time since onset of injury/illness/exacerbation, Transportation, and 3+ comorbidities: DM, HTN, Anemia, Bells palsy, Hypothyroidism, Kidney stones, stroke and pseudotumour cerebri. Macular degeneration with impaired visual acuity  are also affecting patient's functional outcome.   REHAB POTENTIAL: Good  CLINICAL DECISION MAKING: Evolving/moderate complexity  EVALUATION COMPLEXITY: Moderate  PLAN:  PT  FREQUENCY: 2x/week  PT DURATION: 12 weeks  PLANNED INTERVENTIONS: 97164- PT Re-evaluation, 97750- Physical Performance Testing, 97110-Therapeutic exercises, 97530- Therapeutic activity, [id number]- Neuromuscular re-education, 97535- Self Care, [id number]- Manual therapy, [id number]- Gait training, [phone]- Orthotic Initial, [id number]- Orthotic/Prosthetic subsequent, [phone]- Canalith repositioning, [id number]- Splinting, [id number]- Electrical stimulation (unattended), [phone]- Electrical stimulation (manual), [id number]- Vasopneumatic device, [id number]- Ultrasound, [id number]- Traction (mechanical), [id number]- Ionotophoresis 4mg /ml Dexamethasone , Patient/Family education, Balance training, Stair training, Taping, Dry Needling, Joint mobilization, Spinal mobilization, Scar mobilization, Compression bandaging, Vestibular training, Visual/preceptual remediation/compensation, Cognitive remediation, DME instructions, Cryotherapy, Moist heat, and Biofeedback  PLAN FOR NEXT SESSION:   balance, strength, L ankle strength, stairs, LE endurance      Fonda Simpers, PT, DPT Physical Therapist - [facility]  [facility]  03/01/24, 10:21 AM

## 2024-03-03 ENCOUNTER — Ambulatory Visit: Payer: Medicare Other

## 2024-03-03 DIAGNOSIS — R262 Difficulty in walking, not elsewhere classified: Secondary | ICD-10-CM

## 2024-03-03 DIAGNOSIS — M6281 Muscle weakness (generalized): Secondary | ICD-10-CM

## 2024-03-03 DIAGNOSIS — R2689 Other abnormalities of gait and mobility: Secondary | ICD-10-CM

## 2024-03-03 DIAGNOSIS — R2681 Unsteadiness on feet: Secondary | ICD-10-CM

## 2024-03-03 NOTE — Therapy (Signed)
 OUTPATIENT PHYSICAL THERAPY NEURO TREATMENT   Patient Name: Gregory Crane MRN: [id number] DOB:03-Jan-1971, 53 y.o., male Today's Date: 03/03/2024   PCP: Glendia Shad  REFERRING PROVIDER: Glendia Shad  END OF SESSION:  PT End of Session - 03/03/24 1014     Visit Number 13    Number of Visits 24    Date for PT Re-Evaluation 03/22/24    PT Start Time 1015    PT Stop Time 1055    PT Time Calculation (min) 40 min    Equipment Utilized During Treatment Gait belt    Activity Tolerance Patient tolerated treatment well    Behavior During Therapy [facility] for tasks assessed/performed                   Past Medical History:  Diagnosis Date   Allergy    Anemia    Bell's palsy    Diabetes mellitus without complication (HCC)    diet controlled   Fall 07/2023   Hypertension    Hypothyroidism    Kidney stones    Pseudotumor cerebri    Stroke [facility])    Past Surgical History:  Procedure Laterality Date   COLONOSCOPY WITH PROPOFOL  N/A 03/30/2020   Procedure: COLONOSCOPY WITH PROPOFOL ;  Surgeon: Maryruth Ole DASEN, MD;  Location: [facility] ENDOSCOPY;  Service: Endoscopy;  Laterality: N/A;   LOOP RECORDER INSERTION N/A 01/27/2018   Procedure: LOOP RECORDER INSERTION;  Surgeon: Fernande Elspeth BROCKS, MD;  Location: [facility] INVASIVE CV LAB;  Service: Cardiovascular;  Laterality: N/A;   LUMBAR PUNCTURE     as child   NO PAST SURGERIES     REMOVAL OF A DIALYSIS CATHETER     TEE WITHOUT CARDIOVERSION N/A 01/07/2018   Procedure: TRANSESOPHAGEAL ECHOCARDIOGRAM (TEE);  Surgeon: Perla Evalene PARAS, MD;  Location: [facility] ORS;  Service: Cardiovascular;  Laterality: N/A;   Patient Active Problem List   Diagnosis Date Noted   Left hip pain 01/30/2024   Acute on chronic heart failure with preserved ejection fraction (HFpEF) (HCC) 12/26/2023   Community acquired pneumonia 12/24/2023   Acute respiratory failure with hypoxia (HCC) 12/23/2023   SVT (supraventricular tachycardia) (HCC) 08/10/2023    Atrial fibrillation with rapid ventricular response (HCC) 07/03/2023   Atrial fibrillation with RVR (HCC) 07/02/2023   Fall 06/15/2023   Non-compliance with renal dialysis (HCC) 05/02/2023   Obesity (BMI 30-39.9) 04/30/2023   Volume overload 04/29/2023   Renal osteodystrophy 11/25/2022   Unsteady gait 10/05/2022   Steal syndrome of dialysis vascular access (HCC) 05/28/2022   Hydronephrosis, left 05/04/2022   Leukocytosis 05/04/2022   Chest pain 01/13/2022   Diabetic retinopathy associated with diabetes mellitus due to underlying condition (HCC) 01/13/2022   ESRD on hemodialysis (HCC) 01/13/2022   Deafness in right ear 08/11/2021   Open wound 01/25/2021   History of colon polyps 10/15/2020   Acute cholecystitis without calculus 09/09/2020   Type 2 diabetes mellitus, with long-term current use of insulin  (HCC) 09/09/2020   Cryptogenic stroke (HCC) 07/13/2020   History of loop recorder 07/13/2020   Elevated troponin 04/03/2020   Acquired trigger finger 06/08/2019   Lymphedema 06/08/2019   Anemia 04/17/2019   Swelling of both lower extremities 01/10/2019   Facial droop 04/09/2018   Daytime somnolence 03/30/2018   Carotid artery disease (HCC) 02/03/2018   Intracranial vascular stenosis 09/12/2017   Cough 01/20/2017   Bell's palsy 11/10/2016   History of CVA (cerebrovascular accident) 11/10/2016   Benign localized hyperplasia of prostate with urinary obstruction 10/27/2016   History  of nephrolithiasis 10/27/2016   TIA (transient ischemic attack) 10/20/2016   Near syncope 06/23/2016   Organic impotence 10/01/2015   Neuropathy 08/06/2015   Health care maintenance 08/06/2015   Hypertension 08/06/2015   Hypothyroidism 10/10/2013   Microalbuminuria 10/10/2013   Hyperlipidemia 10/10/2013   B12 deficiency 10/10/2013   Environmental allergies 07/04/2013    ONSET DATE: 3 years ago  REFERRING DIAG: unsteady gait  THERAPY DIAG:  Muscle weakness (generalized)  Unsteadiness on  feet  Other abnormalities of gait and mobility  Difficulty in walking, not elsewhere classified  Rationale for Evaluation and Treatment: Rehabilitation  SUBJECTIVE:                                                                                                                                                                                             SUBJECTIVE STATEMENT: Pt reports feeling okay today. Pt states that he was a little sore after previous session. Pt had dialysis yesterday and reports that he is still recovering from it today. Pt not wearing AFO today.  Pt accompanied by: self  PERTINENT HISTORY: Patient returning to PT s/p hospitalization for pneumonia, , SVT, ESRD on hypodialysis. PMH DM, HTN, Anemia, Bells palsy, Hypothyroidism, Kidney stones, stroke and pseudotumour cerebri. Macular degeneration with impaired visual acuity   PAIN:  Are you having pain? Some L foot numbness/neuropathy  PRECAUTIONS: Fall  RED FLAGS: None   WEIGHT BEARING RESTRICTIONS: No  FALLS: Has patient fallen in last 6 months? No  LIVING ENVIRONMENT: Lives with: lives alone Lives in: House/apartment Stairs: yes stairs in house Has following equipment at home: Single point cane  PLOF: Independent with basic ADLs  PATIENT GOALS: balance, strength  OBJECTIVE:  Note: Objective measures were completed at Evaluation unless otherwise noted.  DIAGNOSTIC FINDINGS: IMPRESSION: Interval resolution of previously noted trace left parafalcine subdural hematoma. No acute intracranial process.  COGNITION: Overall cognitive status: Within functional limits for tasks assessed   SENSATION: WFL  COORDINATION: Heel slide: WFL RLE; slight decrease with LLE   MUSCLE TONE: WFL  POSTURE: rounded shoulders, forward head, and posterior pelvic tilt  LOWER EXTREMITY ROM:     Active  Right Eval Left Eval  Hip flexion    Hip extension    Hip abduction    Hip adduction    Hip internal  rotation    Hip external rotation    Knee flexion    Knee extension    Ankle dorsiflexion  -3  Ankle plantarflexion  35  Ankle inversion  10  Ankle eversion  8   (Blank rows = not tested)  LOWER EXTREMITY MMT:  MMT Right Eval Left Eval  Hip flexion 4 4  Hip extension    Hip abduction 3 3  Hip adduction 3+ 3+  Hip internal rotation    Hip external rotation    Knee flexion 4- 4-  Knee extension 4 4  Ankle dorsiflexion 3 2+  Ankle plantarflexion 3+ 3  Ankle inversion    Ankle eversion    (Blank rows = not tested)  BED MOBILITY:  Not tested  TRANSFERS: Sit to stand: CGA  Assistive device utilized: None     Stand to sit: CGA  Assistive device utilized: None     Chair to chair: CGA  Assistive device utilized: None       RAMP:  Not tested  CURB:  Findings: needs UE support to step up  STAIRS: heavy BUE support step over step negotiation.  GAIT: Findings: Gait Characteristics: decreased stride length, Left foot flat, wide BOS, and poor foot clearance- Left, Distance walked: 60 ft, Assistive device utilized:None, Level of assistance: CGA, and Comments: increased L ankle rolling due to not wearing AFO  FUNCTIONAL TESTS:  5 times sit to stand: 30.53 with 4 posterior LOB 6 minute walk test: perform next session  Berg Balance Scale: 26  PATIENT SURVEYS:  LEFS give next session                                                                                                                               TREATMENT DATE: 03/03/24  TherEx: 4 hedgehogs in a square- pt must walk forward/sideways/backward to tap hedgehog x5; 2 sets  Weighted ambulation forward/backward in hallway with 3# AW; 35ft x6- decreased cadence and path variance with backward walking, close CGA for backward walking due to occasional posterior LOB.  Seated: Marches with 3# AW 2x10 both LE  LAQ with 3# AW 2x10 each LE   GTB HS curl 2x10 both LE  Toe taps over orange hurdle with 3# AW 2x10 each  LE    Neuro Re-ed:  Side stepping with Airex pad on both sides of 6 step x10 both directions at support bar  Forward stepping with Airex pad on both sides of 6 step at support bar x10    PATIENT EDUCATION: Education details: goals, POC, HEP Person educated: Patient Education method: Explanation, Demonstration, Tactile cues, Verbal cues, and Handouts Education comprehension: verbalized understanding, returned demonstration, verbal cues required, tactile cues required, and needs further education  HOME EXERCISE PROGRAM: Access Code: [email or web address] URL: [email or web address] Date: 12/29/2023 Prepared by: Marina  Moser  Exercises - Leg Extension  - 1 x daily - 7 x weekly - 2 sets - 10 reps - 5 hold - Seated March  - 1 x daily - 7 x weekly - 2 sets - 10 reps - 5 hold - Seated Heel Raise  - 1 x daily - 7 x weekly - 2 sets - 10 reps - 5 hold  GOALS: Goals reviewed with patient? Yes  SHORT  TERM GOALS: Target date: 01/26/2024    Patient will be independent in home exercise program to improve strength/mobility for better functional independence with ADLs.  Baseline: Goal status: INITIAL  LONG TERM GOALS: Target date: 03/22/2024    Patient (< 28 years old) will complete five times sit to stand test in < 10 seconds indicating an increased LE strength and improved balance.  Baseline: 30.53 with 4 posterior LOB Goal status: INITIAL  2.   Patient will increase Berg Balance score by >45/56 to demonstrate decreased fall risk during functional activities.  Baseline: 5/5: 26  Goal status: INITIAL  3.    Patient will increase six minute walk test distance to >1200 for progression to age norm community ambulator and improve gait ability  Baseline: perform next session 5/12: 710 ft  Goal status: INITIAL  4.  Patient will score > 60/80 on LEFS for improved functional mobility and quality of life.  Baseline: perform next session 5/12: 43 Goal status:  INITIAL    ASSESSMENT:  CLINICAL IMPRESSION: Pt responded well to LE strengthening and dynamic ambulation activities today. Pt did not wear his AFO today, and while it did improve his foot/heel strike with walking, pt demonstrated increased L ankle eversion throughout session. Pt had difficulty with backward ambulation today, requiring close CGA due to posterior lean/LOB. Pt remained motivated to participate in PT today despite fatigue from dialysis yesterday. Patient will benefit from skilled physical therapy to improve strength, mobility, and stability.   OBJECTIVE IMPAIRMENTS: Abnormal gait, cardiopulmonary status limiting activity, decreased activity tolerance, decreased balance, decreased coordination, decreased endurance, decreased knowledge of use of DME, decreased mobility, difficulty walking, decreased ROM, decreased strength, dizziness, impaired perceived functional ability, impaired flexibility, impaired vision/preception, improper body mechanics, postural dysfunction, and obesity.   ACTIVITY LIMITATIONS: carrying, lifting, bending, sitting, standing, squatting, stairs, transfers, bed mobility, toileting, dressing, reach over head, hygiene/grooming, locomotion level, and caring for others  PARTICIPATION LIMITATIONS: meal prep, cleaning, laundry, driving, shopping, community activity, occupation, and yard work  PERSONAL FACTORS: Age, Behavior pattern, Fitness, Past/current experiences, Time since onset of injury/illness/exacerbation, Transportation, and 3+ comorbidities: DM, HTN, Anemia, Bells palsy, Hypothyroidism, Kidney stones, stroke and pseudotumour cerebri. Macular degeneration with impaired visual acuity  are also affecting patient's functional outcome.   REHAB POTENTIAL: Good  CLINICAL DECISION MAKING: Evolving/moderate complexity  EVALUATION COMPLEXITY: Moderate  PLAN:  PT FREQUENCY: 2x/week  PT DURATION: 12 weeks  PLANNED INTERVENTIONS: 97164- PT Re-evaluation, 97750-  Physical Performance Testing, 97110-Therapeutic exercises, 97530- Therapeutic activity, [id number]- Neuromuscular re-education, 97535- Self Care, [id number]- Manual therapy, [id number]- Gait training, [id number]- Orthotic Initial, [id number]- Orthotic/Prosthetic subsequent, [phone]- Canalith repositioning, [id number]- Splinting, [id number]- Electrical stimulation (unattended), [phone]- Electrical stimulation (manual), [id number]- Vasopneumatic device, [id number]- Ultrasound, [id number]- Traction (mechanical), [id number]- Ionotophoresis 4mg /ml Dexamethasone , Patient/Family education, Balance training, Stair training, Taping, Dry Needling, Joint mobilization, Spinal mobilization, Scar mobilization, Compression bandaging, Vestibular training, Visual/preceptual remediation/compensation, Cognitive remediation, DME instructions, Cryotherapy, Moist heat, and Biofeedback  PLAN FOR NEXT SESSION:   balance, strength, L ankle strength, stairs, LE endurance   Janell Axe, SPT  This entire session was performed under direct supervision and direction of a licensed Estate agent . I have personally read, edited and approve of the note as written.   Marina  Moser, PT 03/03/2024, 2:07 PM

## 2024-03-08 ENCOUNTER — Ambulatory Visit: Payer: Medicare Other

## 2024-03-08 DIAGNOSIS — R2689 Other abnormalities of gait and mobility: Secondary | ICD-10-CM

## 2024-03-08 DIAGNOSIS — R269 Unspecified abnormalities of gait and mobility: Secondary | ICD-10-CM

## 2024-03-08 DIAGNOSIS — R262 Difficulty in walking, not elsewhere classified: Secondary | ICD-10-CM

## 2024-03-08 DIAGNOSIS — M6281 Muscle weakness (generalized): Secondary | ICD-10-CM

## 2024-03-08 DIAGNOSIS — R2681 Unsteadiness on feet: Secondary | ICD-10-CM

## 2024-03-08 NOTE — Therapy (Signed)
 OUTPATIENT PHYSICAL THERAPY NEURO TREATMENT   Patient Name: Gregory Crane MRN: [id number] DOB:March 28, 1971, 53 y.o., male Today's Date: 03/08/2024   PCP: Glendia Shad  REFERRING PROVIDER: Glendia Shad  END OF SESSION:  PT End of Session - 03/08/24 0924     Visit Number 14    Number of Visits 24    Date for PT Re-Evaluation 03/22/24    PT Start Time 0926    PT Stop Time 1007    PT Time Calculation (min) 41 min    Equipment Utilized During Treatment Gait belt    Activity Tolerance Patient tolerated treatment well    Behavior During Therapy [facility] for tasks assessed/performed                    Past Medical History:  Diagnosis Date   Allergy    Anemia    Bell's palsy    Diabetes mellitus without complication (HCC)    diet controlled   Fall 07/2023   Hypertension    Hypothyroidism    Kidney stones    Pseudotumor cerebri    Stroke [facility])    Past Surgical History:  Procedure Laterality Date   COLONOSCOPY WITH PROPOFOL  N/A 03/30/2020   Procedure: COLONOSCOPY WITH PROPOFOL ;  Surgeon: Maryruth Ole DASEN, MD;  Location: [facility] ENDOSCOPY;  Service: Endoscopy;  Laterality: N/A;   LOOP RECORDER INSERTION N/A 01/27/2018   Procedure: LOOP RECORDER INSERTION;  Surgeon: Fernande Elspeth BROCKS, MD;  Location: [facility] INVASIVE CV LAB;  Service: Cardiovascular;  Laterality: N/A;   LUMBAR PUNCTURE     as child   NO PAST SURGERIES     REMOVAL OF A DIALYSIS CATHETER     TEE WITHOUT CARDIOVERSION N/A 01/07/2018   Procedure: TRANSESOPHAGEAL ECHOCARDIOGRAM (TEE);  Surgeon: Perla Evalene PARAS, MD;  Location: [facility] ORS;  Service: Cardiovascular;  Laterality: N/A;   Patient Active Problem List   Diagnosis Date Noted   Left hip pain 01/30/2024   Acute on chronic heart failure with preserved ejection fraction (HFpEF) (HCC) 12/26/2023   Community acquired pneumonia 12/24/2023   Acute respiratory failure with hypoxia (HCC) 12/23/2023   SVT (supraventricular tachycardia) (HCC) 08/10/2023    Atrial fibrillation with rapid ventricular response (HCC) 07/03/2023   Atrial fibrillation with RVR (HCC) 07/02/2023   Fall 06/15/2023   Non-compliance with renal dialysis (HCC) 05/02/2023   Obesity (BMI 30-39.9) 04/30/2023   Volume overload 04/29/2023   Renal osteodystrophy 11/25/2022   Unsteady gait 10/05/2022   Steal syndrome of dialysis vascular access (HCC) 05/28/2022   Hydronephrosis, left 05/04/2022   Leukocytosis 05/04/2022   Chest pain 01/13/2022   Diabetic retinopathy associated with diabetes mellitus due to underlying condition (HCC) 01/13/2022   ESRD on hemodialysis (HCC) 01/13/2022   Deafness in right ear 08/11/2021   Open wound 01/25/2021   History of colon polyps 10/15/2020   Acute cholecystitis without calculus 09/09/2020   Type 2 diabetes mellitus, with long-term current use of insulin  (HCC) 09/09/2020   Cryptogenic stroke (HCC) 07/13/2020   History of loop recorder 07/13/2020   Elevated troponin 04/03/2020   Acquired trigger finger 06/08/2019   Lymphedema 06/08/2019   Anemia 04/17/2019   Swelling of both lower extremities 01/10/2019   Facial droop 04/09/2018   Daytime somnolence 03/30/2018   Carotid artery disease (HCC) 02/03/2018   Intracranial vascular stenosis 09/12/2017   Cough 01/20/2017   Bell's palsy 11/10/2016   History of CVA (cerebrovascular accident) 11/10/2016   Benign localized hyperplasia of prostate with urinary obstruction 10/27/2016  History of nephrolithiasis 10/27/2016   TIA (transient ischemic attack) 10/20/2016   Near syncope 06/23/2016   Organic impotence 10/01/2015   Neuropathy 08/06/2015   Health care maintenance 08/06/2015   Hypertension 08/06/2015   Hypothyroidism 10/10/2013   Microalbuminuria 10/10/2013   Hyperlipidemia 10/10/2013   B12 deficiency 10/10/2013   Environmental allergies 07/04/2013    ONSET DATE: 3 years ago  REFERRING DIAG: unsteady gait  THERAPY DIAG:  Muscle weakness (generalized)  Unsteadiness on  feet  Other abnormalities of gait and mobility  Difficulty in walking, not elsewhere classified  Abnormality of gait and mobility  Rationale for Evaluation and Treatment: Rehabilitation  SUBJECTIVE:                                                                                                                                                                                             SUBJECTIVE STATEMENT: Pt reports feeling okay today. Pt denies excessive soreness after previous session. Pt arrives without AFO today.  Pt accompanied by: self  PERTINENT HISTORY: Patient returning to PT s/p hospitalization for pneumonia, , SVT, ESRD on hypodialysis. PMH DM, HTN, Anemia, Bells palsy, Hypothyroidism, Kidney stones, stroke and pseudotumour cerebri. Macular degeneration with impaired visual acuity   PAIN:  Are you having pain? Some L foot numbness/neuropathy  PRECAUTIONS: Fall  RED FLAGS: None   WEIGHT BEARING RESTRICTIONS: No  FALLS: Has patient fallen in last 6 months? No  LIVING ENVIRONMENT: Lives with: lives alone Lives in: House/apartment Stairs: yes stairs in house Has following equipment at home: Single point cane  PLOF: Independent with basic ADLs  PATIENT GOALS: balance, strength  OBJECTIVE:  Note: Objective measures were completed at Evaluation unless otherwise noted.  DIAGNOSTIC FINDINGS: IMPRESSION: Interval resolution of previously noted trace left parafalcine subdural hematoma. No acute intracranial process.  COGNITION: Overall cognitive status: Within functional limits for tasks assessed   SENSATION: WFL  COORDINATION: Heel slide: WFL RLE; slight decrease with LLE   MUSCLE TONE: WFL  POSTURE: rounded shoulders, forward head, and posterior pelvic tilt  LOWER EXTREMITY ROM:     Active  Right Eval Left Eval  Hip flexion    Hip extension    Hip abduction    Hip adduction    Hip internal rotation    Hip external rotation    Knee flexion     Knee extension    Ankle dorsiflexion  -3  Ankle plantarflexion  35  Ankle inversion  10  Ankle eversion  8   (Blank rows = not tested)  LOWER EXTREMITY MMT:    MMT Right Eval Left Eval  Hip flexion 4 4  Hip extension    Hip abduction 3 3  Hip adduction 3+ 3+  Hip internal rotation    Hip external rotation    Knee flexion 4- 4-  Knee extension 4 4  Ankle dorsiflexion 3 2+  Ankle plantarflexion 3+ 3  Ankle inversion    Ankle eversion    (Blank rows = not tested)  BED MOBILITY:  Not tested  TRANSFERS: Sit to stand: CGA  Assistive device utilized: None     Stand to sit: CGA  Assistive device utilized: None     Chair to chair: CGA  Assistive device utilized: None       RAMP:  Not tested  CURB:  Findings: needs UE support to step up  STAIRS: heavy BUE support step over step negotiation.  GAIT: Findings: Gait Characteristics: decreased stride length, Left foot flat, wide BOS, and poor foot clearance- Left, Distance walked: 60 ft, Assistive device utilized:None, Level of assistance: CGA, and Comments: increased L ankle rolling due to not wearing AFO  FUNCTIONAL TESTS:  5 times sit to stand: 30.53 with 4 posterior LOB 6 minute walk test: perform next session  Berg Balance Scale: 26  PATIENT SURVEYS:  LEFS give next session                                                                                                                               TREATMENT DATE: 03/08/24  TherEx: STS with Airex Pad x10- increased difficulty today, minA required at times due to posterior LOB   4 hedgehogs in a square- pt must walk forward/sideways/backward to tap hedgehog x5; 2 sets  Weighted ambulation forward/backward in hallway with 3# AW; 85ft x6- decreased cadence and higher path variance with backward walking, close CGA-min A for backward walking due to occasional posterior LOB. Cues to increased L hip flexion during backward walking to improve foot  clearance  Seated: Adduction squeeze with 3 second isometric x15  GTB HS curl x10 both LE  Toe taps over orange hurdle with x10 each LE     PATIENT EDUCATION: Education details: goals, POC, HEP Person educated: Patient Education method: Explanation, Demonstration, Tactile cues, Verbal cues, and Handouts Education comprehension: verbalized understanding, returned demonstration, verbal cues required, tactile cues required, and needs further education  HOME EXERCISE PROGRAM: Access Code: [email or web address] URL: [email or web address] Date: 12/29/2023 Prepared by: Marina  Moser  Exercises - Leg Extension  - 1 x daily - 7 x weekly - 2 sets - 10 reps - 5 hold - Seated March  - 1 x daily - 7 x weekly - 2 sets - 10 reps - 5 hold - Seated Heel Raise  - 1 x daily - 7 x weekly - 2 sets - 10 reps - 5 hold  GOALS: Goals reviewed with patient? Yes  SHORT TERM GOALS: Target date: 01/26/2024    Patient will be independent in home exercise program to improve strength/mobility for better functional independence with ADLs.  Baseline:  Goal status: INITIAL  LONG TERM GOALS: Target date: 03/22/2024    Patient (< 36 years old) will complete five times sit to stand test in < 10 seconds indicating an increased LE strength and improved balance.  Baseline: 30.53 with 4 posterior LOB Goal status: INITIAL  2.   Patient will increase Berg Balance score by >45/56 to demonstrate decreased fall risk during functional activities.  Baseline: 5/5: 26  Goal status: INITIAL  3.    Patient will increase six minute walk test distance to >1200 for progression to age norm community ambulator and improve gait ability  Baseline: perform next session 5/12: 710 ft  Goal status: INITIAL  4.  Patient will score > 60/80 on LEFS for improved functional mobility and quality of life.  Baseline: perform next session 5/12: 43 Goal status: INITIAL    ASSESSMENT:  CLINICAL IMPRESSION: Pt presented with  decreased endurance today, but remained motivated to participate in PT with encouragement provided throughout session. Pt required increased assistance today, especially with backward walking due to more instances of posterior LOB. Pt with tendency to display pusher-like movements during backward walking when challenged with stopping in place, requiring minA to prevent posterior LOB. Pt tolerated all there-x interventions well despite fatigue and denied soreness at end of session. Patient will benefit from skilled physical therapy to improve strength, mobility, and stability.   OBJECTIVE IMPAIRMENTS: Abnormal gait, cardiopulmonary status limiting activity, decreased activity tolerance, decreased balance, decreased coordination, decreased endurance, decreased knowledge of use of DME, decreased mobility, difficulty walking, decreased ROM, decreased strength, dizziness, impaired perceived functional ability, impaired flexibility, impaired vision/preception, improper body mechanics, postural dysfunction, and obesity.   ACTIVITY LIMITATIONS: carrying, lifting, bending, sitting, standing, squatting, stairs, transfers, bed mobility, toileting, dressing, reach over head, hygiene/grooming, locomotion level, and caring for others  PARTICIPATION LIMITATIONS: meal prep, cleaning, laundry, driving, shopping, community activity, occupation, and yard work  PERSONAL FACTORS: Age, Behavior pattern, Fitness, Past/current experiences, Time since onset of injury/illness/exacerbation, Transportation, and 3+ comorbidities: DM, HTN, Anemia, Bells palsy, Hypothyroidism, Kidney stones, stroke and pseudotumour cerebri. Macular degeneration with impaired visual acuity  are also affecting patient's functional outcome.   REHAB POTENTIAL: Good  CLINICAL DECISION MAKING: Evolving/moderate complexity  EVALUATION COMPLEXITY: Moderate  PLAN:  PT FREQUENCY: 2x/week  PT DURATION: 12 weeks  PLANNED INTERVENTIONS: 97164- PT  Re-evaluation, 97750- Physical Performance Testing, 97110-Therapeutic exercises, 97530- Therapeutic activity, [id number]- Neuromuscular re-education, 97535- Self Care, 02859- Manual therapy, [id number]- Gait training, [id number]- Orthotic Initial, [id number]- Orthotic/Prosthetic subsequent, [phone]- Canalith repositioning, [id number]- Splinting, H9716- Electrical stimulation (unattended), [phone]- Electrical stimulation (manual), [id number]- Vasopneumatic device, [id number]- Ultrasound, [id number]- Traction (mechanical), [id number]- Ionotophoresis 4mg /ml Dexamethasone , Patient/Family education, Balance training, Stair training, Taping, Dry Needling, Joint mobilization, Spinal mobilization, Scar mobilization, Compression bandaging, Vestibular training, Visual/preceptual remediation/compensation, Cognitive remediation, DME instructions, Cryotherapy, Moist heat, and Biofeedback  PLAN FOR NEXT SESSION:   balance, strength, L ankle strength, stairs, LE endurance   Janell Axe, SPT  This entire session was performed under direct supervision and direction of a licensed Estate agent . I have personally read, edited and approve of the note as written.   Marina  Moser, PT 03/08/2024, 11:42 AM

## 2024-03-10 ENCOUNTER — Ambulatory Visit: Payer: Medicare Other

## 2024-03-14 ENCOUNTER — Other Ambulatory Visit: Payer: Self-pay

## 2024-03-14 ENCOUNTER — Encounter (HOSPITAL_COMMUNITY): Payer: Self-pay | Admitting: *Deleted

## 2024-03-14 ENCOUNTER — Inpatient Hospital Stay (HOSPITAL_COMMUNITY)
Admission: EM | Admit: 2024-03-14 | Discharge: 2024-03-18 | DRG: 981 | Disposition: A | Discharge status: Home / Self Care | Attending: Internal Medicine | Admitting: Internal Medicine

## 2024-03-14 ENCOUNTER — Emergency Department (HOSPITAL_COMMUNITY)

## 2024-03-14 DIAGNOSIS — I4892 Unspecified atrial flutter: Secondary | ICD-10-CM | POA: Diagnosis present

## 2024-03-14 DIAGNOSIS — I959 Hypotension, unspecified: Secondary | ICD-10-CM | POA: Diagnosis not present

## 2024-03-14 DIAGNOSIS — I3139 Other pericardial effusion (noninflammatory): Secondary | ICD-10-CM | POA: Diagnosis not present

## 2024-03-14 DIAGNOSIS — Z8673 Personal history of transient ischemic attack (TIA), and cerebral infarction without residual deficits: Secondary | ICD-10-CM

## 2024-03-14 DIAGNOSIS — I509 Heart failure, unspecified: Secondary | ICD-10-CM | POA: Diagnosis present

## 2024-03-14 DIAGNOSIS — N186 End stage renal disease: Secondary | ICD-10-CM | POA: Diagnosis present

## 2024-03-14 DIAGNOSIS — E66812 Obesity, class 2: Secondary | ICD-10-CM | POA: Diagnosis present

## 2024-03-14 DIAGNOSIS — Z6836 Body mass index (BMI) 36.0-36.9, adult: Secondary | ICD-10-CM | POA: Diagnosis not present

## 2024-03-14 DIAGNOSIS — E8770 Fluid overload, unspecified: Secondary | ICD-10-CM | POA: Diagnosis not present

## 2024-03-14 DIAGNOSIS — E875 Hyperkalemia: Secondary | ICD-10-CM | POA: Diagnosis present

## 2024-03-14 DIAGNOSIS — Z7902 Long term (current) use of antithrombotics/antiplatelets: Secondary | ICD-10-CM

## 2024-03-14 DIAGNOSIS — Z79899 Other long term (current) drug therapy: Secondary | ICD-10-CM

## 2024-03-14 DIAGNOSIS — E039 Hypothyroidism, unspecified: Secondary | ICD-10-CM | POA: Diagnosis present

## 2024-03-14 DIAGNOSIS — I4891 Unspecified atrial fibrillation: Secondary | ICD-10-CM | POA: Diagnosis present

## 2024-03-14 DIAGNOSIS — I480 Paroxysmal atrial fibrillation: Secondary | ICD-10-CM | POA: Diagnosis present

## 2024-03-14 DIAGNOSIS — E872 Acidosis, unspecified: Secondary | ICD-10-CM | POA: Diagnosis present

## 2024-03-14 DIAGNOSIS — J9601 Acute respiratory failure with hypoxia: Secondary | ICD-10-CM | POA: Diagnosis present

## 2024-03-14 DIAGNOSIS — I214 Non-ST elevation (NSTEMI) myocardial infarction: Secondary | ICD-10-CM | POA: Diagnosis present

## 2024-03-14 DIAGNOSIS — I132 Hypertensive heart and chronic kidney disease with heart failure and with stage 5 chronic kidney disease, or end stage renal disease: Secondary | ICD-10-CM | POA: Diagnosis present

## 2024-03-14 DIAGNOSIS — Z7982 Long term (current) use of aspirin: Secondary | ICD-10-CM

## 2024-03-14 DIAGNOSIS — Z955 Presence of coronary angioplasty implant and graft: Secondary | ICD-10-CM

## 2024-03-14 DIAGNOSIS — I2489 Other forms of acute ischemic heart disease: Secondary | ICD-10-CM | POA: Insufficient documentation

## 2024-03-14 DIAGNOSIS — I472 Ventricular tachycardia, unspecified: Secondary | ICD-10-CM | POA: Diagnosis present

## 2024-03-14 DIAGNOSIS — K7460 Unspecified cirrhosis of liver: Secondary | ICD-10-CM | POA: Diagnosis present

## 2024-03-14 DIAGNOSIS — D631 Anemia in chronic kidney disease: Secondary | ICD-10-CM | POA: Diagnosis present

## 2024-03-14 DIAGNOSIS — E1122 Type 2 diabetes mellitus with diabetic chronic kidney disease: Secondary | ICD-10-CM | POA: Diagnosis present

## 2024-03-14 DIAGNOSIS — Z992 Dependence on renal dialysis: Secondary | ICD-10-CM | POA: Diagnosis not present

## 2024-03-14 DIAGNOSIS — Z91158 Patient's noncompliance with renal dialysis for other reason: Secondary | ICD-10-CM

## 2024-03-14 DIAGNOSIS — E8779 Other fluid overload: Principal | ICD-10-CM | POA: Diagnosis present

## 2024-03-14 DIAGNOSIS — R0602 Shortness of breath: Secondary | ICD-10-CM | POA: Diagnosis present

## 2024-03-14 DIAGNOSIS — Z803 Family history of malignant neoplasm of breast: Secondary | ICD-10-CM

## 2024-03-14 DIAGNOSIS — I10 Essential (primary) hypertension: Secondary | ICD-10-CM | POA: Diagnosis not present

## 2024-03-14 DIAGNOSIS — R001 Bradycardia, unspecified: Secondary | ICD-10-CM | POA: Diagnosis not present

## 2024-03-14 DIAGNOSIS — Z8261 Family history of arthritis: Secondary | ICD-10-CM

## 2024-03-14 DIAGNOSIS — I2510 Atherosclerotic heart disease of native coronary artery without angina pectoris: Secondary | ICD-10-CM | POA: Diagnosis present

## 2024-03-14 DIAGNOSIS — I4719 Other supraventricular tachycardia: Secondary | ICD-10-CM | POA: Diagnosis present

## 2024-03-14 DIAGNOSIS — Z833 Family history of diabetes mellitus: Secondary | ICD-10-CM

## 2024-03-14 DIAGNOSIS — E785 Hyperlipidemia, unspecified: Secondary | ICD-10-CM | POA: Diagnosis present

## 2024-03-14 DIAGNOSIS — Z9861 Coronary angioplasty status: Secondary | ICD-10-CM | POA: Diagnosis not present

## 2024-03-14 DIAGNOSIS — Z79890 Hormone replacement therapy (postmenopausal): Secondary | ICD-10-CM

## 2024-03-14 DIAGNOSIS — Z7901 Long term (current) use of anticoagulants: Secondary | ICD-10-CM

## 2024-03-14 DIAGNOSIS — Z87442 Personal history of urinary calculi: Secondary | ICD-10-CM

## 2024-03-14 DIAGNOSIS — J9691 Respiratory failure, unspecified with hypoxia: Secondary | ICD-10-CM | POA: Insufficient documentation

## 2024-03-14 LAB — CBC
HCT: 30.7 % — ABNORMAL LOW (ref 39.0–52.0)
Hemoglobin: 10.2 g/dL — ABNORMAL LOW (ref 13.0–17.0)
MCH: 31.5 pg (ref 26.0–34.0)
MCHC: 33.2 g/dL (ref 30.0–36.0)
MCV: 94.8 fL (ref 80.0–100.0)
Platelets: 160 K/uL (ref 150–400)
RBC: 3.24 MIL/uL — ABNORMAL LOW (ref 4.22–5.81)
RDW: 13.2 % (ref 11.5–15.5)
WBC: 10.6 K/uL — ABNORMAL HIGH (ref 4.0–10.5)
nRBC: 0 % (ref 0.0–0.2)

## 2024-03-14 LAB — HEPATITIS B SURFACE ANTIGEN: Hepatitis B Surface Ag: NONREACTIVE

## 2024-03-14 LAB — I-STAT VENOUS BLOOD GAS, ED
Acid-base deficit: 14 mmol/L — ABNORMAL HIGH (ref 0.0–2.0)
Bicarbonate: 11.3 mmol/L — ABNORMAL LOW (ref 20.0–28.0)
Calcium, Ion: 0.82 mmol/L — CL (ref 1.15–1.40)
HCT: 25 % — ABNORMAL LOW (ref 39.0–52.0)
Hemoglobin: 8.5 g/dL — ABNORMAL LOW (ref 13.0–17.0)
O2 Saturation: 90 %
Potassium: 4.6 mmol/L (ref 3.5–5.1)
Sodium: 141 mmol/L (ref 135–145)
TCO2: 12 mmol/L — ABNORMAL LOW (ref 22–32)
pCO2, Ven: 24 mmHg — ABNORMAL LOW (ref 44–60)
pH, Ven: 7.278 (ref 7.25–7.43)
pO2, Ven: 64 mmHg — ABNORMAL HIGH (ref 32–45)

## 2024-03-14 LAB — BASIC METABOLIC PANEL WITH GFR
Anion gap: 24 — ABNORMAL HIGH (ref 5–15)
BUN: 154 mg/dL — ABNORMAL HIGH (ref 6–20)
CO2: 12 mmol/L — ABNORMAL LOW (ref 22–32)
Calcium: 7 mg/dL — ABNORMAL LOW (ref 8.9–10.3)
Chloride: 106 mmol/L (ref 98–111)
Creatinine, Ser: 14.73 mg/dL — ABNORMAL HIGH (ref 0.61–1.24)
GFR, Estimated: 4 mL/min — ABNORMAL LOW (ref >60)
Glucose, Bld: 147 mg/dL — ABNORMAL HIGH (ref 70–99)
Potassium: 4.7 mmol/L (ref 3.5–5.1)
Sodium: 142 mmol/L (ref 135–145)

## 2024-03-14 LAB — MAGNESIUM: Magnesium: 2.6 mg/dL — ABNORMAL HIGH (ref 1.7–2.4)

## 2024-03-14 LAB — TROPONIN I (HIGH SENSITIVITY)
Troponin I (High Sensitivity): 64 ng/L — ABNORMAL HIGH (ref <18)
Troponin I (High Sensitivity): 381 ng/L (ref <18)

## 2024-03-14 LAB — LACTIC ACID, PLASMA: Lactic Acid, Venous: 0.8 mmol/L (ref 0.5–1.9)

## 2024-03-14 MED ORDER — AMIODARONE HCL IN DEXTROSE 360-4.14 MG/200ML-% IV SOLN
60.0000 mg/h | INTRAVENOUS | Status: AC
  Administered 2024-03-14: 60 mg/h via INTRAVENOUS
  Filled 2024-03-14 (×2): qty 200

## 2024-03-14 MED ORDER — APIXABAN 5 MG PO TABS
5.0000 mg | ORAL_TABLET | Freq: Two times a day (BID) | ORAL | Status: DC
  Administered 2024-03-15: 5 mg via ORAL
  Filled 2024-03-14: qty 1

## 2024-03-14 MED ORDER — METOPROLOL TARTRATE 12.5 MG HALF TABLET
37.5000 mg | ORAL_TABLET | Freq: Two times a day (BID) | ORAL | Status: DC
  Administered 2024-03-15 (×2): 37.5 mg via ORAL
  Filled 2024-03-14 (×5): qty 3

## 2024-03-14 MED ORDER — DILTIAZEM LOAD VIA INFUSION
10.0000 mg | Freq: Once | INTRAVENOUS | Status: DC
  Filled 2024-03-14: qty 10

## 2024-03-14 MED ORDER — FUROSEMIDE 10 MG/ML IJ SOLN
80.0000 mg | Freq: Once | INTRAMUSCULAR | Status: AC
  Administered 2024-03-14: 80 mg via INTRAVENOUS
  Filled 2024-03-14: qty 8

## 2024-03-14 MED ORDER — MELATONIN 3 MG PO TABS
6.0000 mg | ORAL_TABLET | Freq: Every evening | ORAL | Status: DC | PRN
  Administered 2024-03-15 – 2024-03-16 (×2): 6 mg via ORAL
  Filled 2024-03-14 (×2): qty 2

## 2024-03-14 MED ORDER — INSULIN ASPART 100 UNIT/ML IJ SOLN: 0.0000 [IU] | Freq: Three times a day (TID) | INTRAMUSCULAR | Status: DC

## 2024-03-14 MED ORDER — DILTIAZEM HCL ER COATED BEADS 120 MG PO CP24
120.0000 mg | ORAL_CAPSULE | Freq: Every day | ORAL | Status: DC
  Administered 2024-03-15: 120 mg via ORAL
  Filled 2024-03-14 (×3): qty 1

## 2024-03-14 MED ORDER — PREGABALIN 25 MG PO CAPS
50.0000 mg | ORAL_CAPSULE | Freq: Every day | ORAL | Status: DC
  Administered 2024-03-15 – 2024-03-17 (×4): 50 mg via ORAL
  Filled 2024-03-14 (×4): qty 2

## 2024-03-14 MED ORDER — FUROSEMIDE 10 MG/ML IJ SOLN
40.0000 mg | Freq: Two times a day (BID) | INTRAMUSCULAR | Status: DC
  Administered 2024-03-15: 40 mg via INTRAVENOUS
  Filled 2024-03-14 (×2): qty 4

## 2024-03-14 MED ORDER — AMIODARONE HCL IN DEXTROSE 360-4.14 MG/200ML-% IV SOLN: 30.0000 mg/h | INTRAVENOUS | Status: DC

## 2024-03-14 MED ORDER — HYDRALAZINE HCL 25 MG PO TABS
25.0000 mg | ORAL_TABLET | Freq: Two times a day (BID) | ORAL | Status: DC
Start: 2024-03-14 — End: 2024-03-16
  Administered 2024-03-15 (×2): 25 mg via ORAL
  Filled 2024-03-14 (×3): qty 1

## 2024-03-14 MED ORDER — AMIODARONE IV BOLUS ONLY 150 MG/100ML
150.0000 mg | Freq: Once | INTRAVENOUS | Status: AC
  Administered 2024-03-14: 150 mg via INTRAVENOUS
  Filled 2024-03-14: qty 100

## 2024-03-14 MED ORDER — ONDANSETRON HCL 4 MG/2ML IJ SOLN
4.0000 mg | Freq: Four times a day (QID) | INTRAMUSCULAR | Status: DC | PRN
  Administered 2024-03-17 (×2): 4 mg via INTRAVENOUS
  Filled 2024-03-14: qty 2

## 2024-03-14 MED ORDER — LEVOTHYROXINE SODIUM 75 MCG PO TABS
175.0000 ug | ORAL_TABLET | Freq: Every day | ORAL | Status: DC
  Administered 2024-03-15 – 2024-03-18 (×4): 175 ug via ORAL
  Filled 2024-03-14 (×4): qty 1

## 2024-03-14 MED ORDER — ALBUTEROL SULFATE (2.5 MG/3ML) 0.083% IN NEBU
2.5000 mg | INHALATION_SOLUTION | RESPIRATORY_TRACT | Status: DC | PRN
  Administered 2024-03-15: 2.5 mg via RESPIRATORY_TRACT
  Filled 2024-03-14: qty 3

## 2024-03-14 MED ORDER — ROSUVASTATIN CALCIUM 5 MG PO TABS
5.0000 mg | ORAL_TABLET | Freq: Every day | ORAL | Status: DC
  Administered 2024-03-15 – 2024-03-16 (×2): 5 mg via ORAL
  Filled 2024-03-14 (×2): qty 1

## 2024-03-14 MED ORDER — ACETAMINOPHEN 500 MG PO TABS
1000.0000 mg | ORAL_TABLET | Freq: Four times a day (QID) | ORAL | Status: DC | PRN
  Administered 2024-03-17: 1000 mg via ORAL

## 2024-03-14 MED ORDER — DORZOLAMIDE HCL-TIMOLOL MAL 2-0.5 % OP SOLN
1.0000 [drp] | Freq: Two times a day (BID) | OPHTHALMIC | Status: DC
  Administered 2024-03-15 – 2024-03-18 (×6): 1 [drp] via OPHTHALMIC
  Filled 2024-03-14 (×2): qty 10

## 2024-03-14 MED ORDER — SODIUM CHLORIDE 0.9% FLUSH
3.0000 mL | Freq: Two times a day (BID) | INTRAVENOUS | Status: DC
  Administered 2024-03-15 – 2024-03-18 (×5): 3 mL via INTRAVENOUS

## 2024-03-14 MED ORDER — DILTIAZEM HCL-DEXTROSE 125-5 MG/125ML-% IV SOLN (PREMIX): 5.0000 mg/h | INTRAVENOUS | Status: DC

## 2024-03-14 MED ORDER — CHLORHEXIDINE GLUCONATE CLOTH 2 % EX PADS
6.0000 | MEDICATED_PAD | Freq: Every day | CUTANEOUS | Status: DC
  Administered 2024-03-16: 6 via TOPICAL

## 2024-03-14 MED ORDER — CALCIUM GLUCONATE-NACL 1-0.675 GM/50ML-% IV SOLN
1.0000 g | Freq: Once | INTRAVENOUS | Status: AC
  Administered 2024-03-14: 1000 mg via INTRAVENOUS
  Filled 2024-03-14: qty 50

## 2024-03-14 MED ORDER — POLYETHYLENE GLYCOL 3350 17 G PO PACK: 17.0000 g | PACK | Freq: Every day | ORAL | Status: DC | PRN

## 2024-03-14 NOTE — ED Triage Notes (Signed)
 The pt has missed dialysis for one week  he is c/o unable to breathe  fistula lrt arm 02 sats low  heart rate rapid 150  nasal 02 placed

## 2024-03-14 NOTE — ED Provider Notes (Signed)
 [place] EMERGENCY DEPARTMENT AT [facility] Provider Note   CSN: [id number] Arrival date & time: 03/14/24  2010     Patient presents with: Shortness of Breath   Gregory Crane is a 53 y.o. male.   HPI 53 year old male presents with shortness of breath.  He has missed dialysis for a week because he was trying something new.  He has been feeling short of breath but then acutely got worse today.  No chest pain but he does have some chest pressure.  He feels swollen, especially in his abdomen.  Prior to Admission medications   Medication Sig Start Date End Date Taking? Authorizing Provider  amiodarone  (PACERONE ) 200 MG tablet Take 1 tablet (200 mg total) by mouth 2 (two) times daily. 12/26/23   Caleen Qualia, MD  apixaban  (ELIQUIS ) 5 MG TABS tablet Take 1 tablet (5 mg total) by mouth 2 (two) times daily. 07/03/23   Awanda City, MD  diltiazem  (CARDIZEM  CD) 120 MG 24 hr capsule Take 1 capsule (120 mg total) by mouth daily. 12/26/23   Amin, Sumayya, MD  dorzolamide -timolol  (COSOPT ) 2-0.5 % ophthalmic solution Place 1 drop into both eyes 2 (two) times daily. 11/26/23   [provider]  furosemide  (LASIX ) 40 MG tablet Take 1 tablet (40 mg total) by mouth 2 (two) times daily. 07/03/23   Awanda City, MD  hydrALAZINE  (APRESOLINE ) 25 MG tablet Take 25 mg by mouth 2 (two) times daily. 11/05/23   [provider]  levothyroxine  (SYNTHROID ) 175 MCG tablet TAKE 1 TABLET (175 MCG) BY MOUTH ONCE DAILY BEFORE BREAKFAST. 10/09/23   Glendia Shad, MD  metoprolol  tartrate 37.5 MG TABS Take 1 tablet (37.5 mg total) by mouth 2 (two) times daily. 12/26/23   Amin, Sumayya, MD  pregabalin  (LYRICA ) 50 MG capsule 1 -2 tablets q hs 01/30/24   Glendia Shad, MD  RENVELA  800 MG tablet Take 800 mg by mouth 3 (three) times daily. 04/23/22   [provider]  rosuvastatin  (CRESTOR ) 5 MG tablet Take 5 mg by mouth daily.    [provider]    Allergies: Patient has no known allergies.     Review of Systems  Respiratory:  Positive for shortness of breath.   Cardiovascular:  Positive for chest pain. Negative for leg swelling.  Gastrointestinal:  Positive for abdominal distention.    Updated Vital Signs BP (!) 155/96   Pulse 83   Temp 98.4 F (36.9 C)   Resp (!) 24   Ht 6' (1.829 m)   Wt 122.9 kg   SpO2 100%   BMI 36.75 kg/m   Physical Exam Vitals and nursing note reviewed.  Constitutional:      Appearance: He is well-developed. He is ill-appearing. He is not diaphoretic.  HENT:     Head: Normocephalic and atraumatic.  Cardiovascular:     Rate and Rhythm: Regular rhythm. Tachycardia present.     Heart sounds: Normal heart sounds.  Pulmonary:     Effort: Tachypnea and accessory muscle usage present.     Breath sounds: Examination of the right-lower field reveals decreased breath sounds and rales. Examination of the left-lower field reveals rales. Decreased breath sounds and rales present.  Abdominal:     Palpations: Abdomen is soft.     Tenderness: There is no abdominal tenderness.  Musculoskeletal:     Comments: Mild pitting edema to ankles  Skin:    General: Skin is warm and dry.  Neurological:     Mental Status: He is alert.     (  all labs ordered are listed, but only abnormal results are displayed) Labs Reviewed  BASIC METABOLIC PANEL WITH GFR - Abnormal; Notable for the following components:      Result Value   CO2 12 (*)    Glucose, Bld 147 (*)    BUN 154 (*)    Creatinine, Ser 14.73 (*)    Calcium  7.0 (*)    GFR, Estimated 4 (*)    Anion gap 24 (*)    All other components within normal limits  CBC - Abnormal; Notable for the following components:   WBC 10.6 (*)    RBC 3.24 (*)    Hemoglobin 10.2 (*)    HCT 30.7 (*)    All other components within normal limits  MAGNESIUM - Abnormal; Notable for the following components:   Magnesium 2.6 (*)    All other components within normal limits  I-STAT VENOUS BLOOD GAS, ED - Abnormal; Notable  for the following components:   pCO2, Ven 24.0 (*)    pO2, Ven 64 (*)    Bicarbonate 11.3 (*)    TCO2 12 (*)    Acid-base deficit 14.0 (*)    Calcium , Ion 0.82 (*)    HCT 25.0 (*)    Hemoglobin 8.5 (*)    All other components within normal limits  TROPONIN I (HIGH SENSITIVITY) - Abnormal; Notable for the following components:   Troponin I (High Sensitivity) 64 (*)    All other components within normal limits  HEPATITIS B SURFACE ANTIGEN  HEPATITIS B SURFACE ANTIBODY, QUANTITATIVE  BASIC METABOLIC PANEL WITH GFR  CBC  MAGNESIUM  PHOSPHORUS  TSH  LACTIC ACID, PLASMA  TROPONIN I (HIGH SENSITIVITY)    EKG: EKG Interpretation Date/Time:  Sunday March 14 2024 20:28:55 EDT Ventricular Rate:  156 PR Interval:    QRS Duration:  86 QT Interval:  294 QTC Calculation: 473 R Axis:   112  Text Interpretation: Supraventricular tachycardia Right axis deviation Marked ST abnormality, possible inferior subendocardial injury Confirmed by Freddi Hamilton [phone]) on 03/14/2024 8:40:40 PM  Radiology: [facility] Chest Portable 1 View Result Date: 03/14/2024 EXAM: 1 VIEW XRAY OF THE CHEST 03/14/2024 09:23:00 PM COMPARISON: 12/23/2023 CLINICAL HISTORY: Chest pain, dyspnea. The patient has missed dialysis for one week, is complaining of being unable to breathe, has a fistula in the left arm, low O2 sats, rapid heart rate of 150, and nasal O2 placed. FINDINGS: LUNGS AND PLEURA: No focal pulmonary opacity. No pulmonary edema. No pleural effusion. No pneumothorax. Bibasilar atelectasis. HEART AND MEDIASTINUM: Mild cardiomegaly, unchanged. BONES AND SOFT TISSUES: No acute osseous abnormality. IMPRESSION: 1. Mild cardiomegaly, unchanged. 2. Bibasilar atelectasis. Electronically signed by: Franky Stanford MD 03/14/2024 09:32 PM EDT RP Workstation: [id number]     .Critical Care  Performed by: Freddi Hamilton, MD Authorized by: Freddi Hamilton, MD   Critical care provider statement:    Critical care time (minutes):   45   Critical care time was exclusive of:  Separately billable procedures and treating other patients   Critical care was necessary to treat or prevent imminent or life-threatening deterioration of the following conditions:  Cardiac failure and respiratory failure   Critical care was time spent personally by me on the following activities:  Development of treatment plan with patient or surrogate, discussions with consultants, evaluation of patient's response to treatment, examination of patient, ordering and review of laboratory studies, ordering and review of radiographic studies, ordering and performing treatments and interventions, pulse oximetry, re-evaluation of patient's condition and review of  old charts    Medications Ordered in the ED  amiodarone  (NEXTERONE  PREMIX) 360-4.14 MG/200ML-% (1.8 mg/mL) IV infusion (60 mg/hr Intravenous New Bag/Given 03/14/24 2141)    Followed by  amiodarone  (NEXTERONE  PREMIX) 360-4.14 MG/200ML-% (1.8 mg/mL) IV infusion (has no administration in time range)  Chlorhexidine  Gluconate Cloth 2 % PADS 6 each (has no administration in time range)  sodium chloride  flush (NS) 0.9 % injection 3 mL (3 mLs Intravenous Not Given 03/14/24 2228)  acetaminophen  (TYLENOL ) tablet 1,000 mg (has no administration in time range)  albuterol  (PROVENTIL ) (2.5 MG/3ML) 0.083% nebulizer solution 2.5 mg (has no administration in time range)  melatonin tablet 6 mg (has no administration in time range)  ondansetron  (ZOFRAN ) injection 4 mg (has no administration in time range)  polyethylene glycol (MIRALAX  / GLYCOLAX ) packet 17 g (has no administration in time range)  diltiazem  (CARDIZEM  CD) 24 hr capsule 120 mg (has no administration in time range)  furosemide  (LASIX ) injection 80 mg (has no administration in time range)  furosemide  (LASIX ) injection 40 mg (has no administration in time range)  hydrALAZINE  (APRESOLINE ) tablet 25 mg (has no administration in time range)  metoprolol  tartrate  (LOPRESSOR ) tablet 37.5 mg (has no administration in time range)  rosuvastatin  (CRESTOR ) tablet 5 mg (has no administration in time range)  levothyroxine  (SYNTHROID ) tablet 175 mcg (has no administration in time range)  apixaban  (ELIQUIS ) tablet 5 mg (has no administration in time range)  pregabalin  (LYRICA ) capsule 50-100 mg (has no administration in time range)  dorzolamide -timolol  (COSOPT ) 2-0.5 % ophthalmic solution 1 drop (1 drop Both Eyes Not Given 03/14/24 2247)  insulin  aspart (novoLOG ) injection 0-6 Units (has no administration in time range)  calcium  gluconate 1 g/ 50 mL sodium chloride  IVPB (0 mg Intravenous Stopped 03/14/24 2122)  amiodarone  (NEXTERONE ) 1.5 mg/mL IV bolus only 150 mg (0 mg Intravenous Stopped 03/14/24 2205)                                    Medical Decision Making Amount and/or Complexity of Data Reviewed Labs: ordered.    Details: Metabolic acidosis Radiology: ordered and independent interpretation performed.    Details: Mild CHF ECG/medicine tests: ordered and independent interpretation performed.    Details: SVT versus atrial flutter  Risk Prescription drug management. Decision regarding hospitalization.   Patient presents with increased work of breathing and shortness of breath in association with missing dialysis for about a week.  Surprisingly he is not hyperkalemic but is having a metabolic acidosis.  Was put on BiPAP for work of breathing which has helped.  In and out of what appears to be an SVT versus 2-1 atrial flutter as well as runs of ventricular tachycardia.  discussed with cardiology, Dr. Charlott, likely this is all related to needing dialysis but would be reasonable to treat with amiodarone  bolus and drip for now.  He was started on this and I consulted Dr. Bryant of nephrology who will dialyze.  He will need medical admission, discussed with Dr. Segars.       Final diagnoses:  Hypervolemia, unspecified hypervolemia type  Atrial flutter  with rapid ventricular response [facility])  Ventricular tachycardia [facility])    ED Discharge Orders     None          Freddi Hamilton, MD 03/14/24 2304

## 2024-03-14 NOTE — H&P (Addendum)
 History and Physical    Gregory Crane FMW:[id number] DOB: [birth year] DOA: 03/14/2024  PCP: Glendia Shad, MD   Patient coming from: Home   Chief Complaint:  Chief Complaint  Patient presents with   Shortness of Breath    HPI: Limited due to acuity, on BiPAP  Gregory Crane is a 53 y.o. male with hx of ESRD on TT HD, Afib on AC, prior CVA, HTN, HLD, DM, Hypothyroidism, who presented due to progressive SOB. Reports that he missed dialysis over the past week, last went 7/11. He says missed this past week because he was trying something different, you wouldn't understand. Asked to elaborate says that he was hoping my faith would get me through. Reports that he's still making urine and was taking lasix  as prescribed. Denies any chest pain. Is aware of intermittent palpitations. Denies other recent illness    Review of Systems:  ROS limited due to acuity, per above   No Known Allergies  Prior to Admission medications   Medication Sig Start Date End Date Taking? Authorizing Provider  amiodarone  (PACERONE ) 200 MG tablet Take 1 tablet (200 mg total) by mouth 2 (two) times daily. 12/26/23   Caleen Qualia, MD  apixaban  (ELIQUIS ) 5 MG TABS tablet Take 1 tablet (5 mg total) by mouth 2 (two) times daily. 07/03/23   Awanda City, MD  diltiazem  (CARDIZEM  CD) 120 MG 24 hr capsule Take 1 capsule (120 mg total) by mouth daily. 12/26/23   Amin, Sumayya, MD  dorzolamide -timolol  (COSOPT ) 2-0.5 % ophthalmic solution Place 1 drop into both eyes 2 (two) times daily. 11/26/23   [provider]  furosemide  (LASIX ) 40 MG tablet Take 1 tablet (40 mg total) by mouth 2 (two) times daily. 07/03/23   Awanda City, MD  hydrALAZINE  (APRESOLINE ) 25 MG tablet Take 25 mg by mouth 2 (two) times daily. 11/05/23   [provider]  levothyroxine  (SYNTHROID ) 175 MCG tablet TAKE 1 TABLET (175 MCG) BY MOUTH ONCE DAILY BEFORE BREAKFAST. 10/09/23   Glendia Shad, MD  metoprolol  tartrate 37.5 MG TABS Take 1 tablet  (37.5 mg total) by mouth 2 (two) times daily. 12/26/23   Amin, Sumayya, MD  pregabalin  (LYRICA ) 50 MG capsule 1 -2 tablets q hs 01/30/24   Glendia Shad, MD  RENVELA  800 MG tablet Take 800 mg by mouth 3 (three) times daily. 04/23/22   [provider]  rosuvastatin  (CRESTOR ) 5 MG tablet Take 5 mg by mouth daily.    [provider]    Past Medical History:  Diagnosis Date   Allergy    Anemia    Bell's palsy    Diabetes mellitus without complication (HCC)    diet controlled   Fall 07/2023   Hypertension    Hypothyroidism    Kidney stones    Pseudotumor cerebri    Stroke [facility]     Past Surgical History:  Procedure Laterality Date   COLONOSCOPY WITH PROPOFOL  N/A 03/30/2020   Procedure: COLONOSCOPY WITH PROPOFOL ;  Surgeon: Maryruth Ole DASEN, MD;  Location: [facility] ENDOSCOPY;  Service: Endoscopy;  Laterality: N/A;   LOOP RECORDER INSERTION N/A 01/27/2018   Procedure: LOOP RECORDER INSERTION;  Surgeon: Fernande Elspeth BROCKS, MD;  Location: [facility] INVASIVE CV LAB;  Service: Cardiovascular;  Laterality: N/A;   LUMBAR PUNCTURE     as child   NO PAST SURGERIES     REMOVAL OF A DIALYSIS CATHETER     TEE WITHOUT CARDIOVERSION N/A 01/07/2018   Procedure: TRANSESOPHAGEAL ECHOCARDIOGRAM (TEE);  Surgeon: Gollan,  Evalene PARAS, MD;  Location: [facility] ORS;  Service: Cardiovascular;  Laterality: N/A;     reports that he has never smoked. He has never used smokeless tobacco. He reports that he does not drink alcohol and does not use drugs.  Family History  Problem Relation Age of Onset   Breast cancer Mother    Diabetes Father    Diabetes Sister    Arthritis Maternal Grandmother    Diabetes Maternal Grandmother      Physical Exam: Vitals:   03/14/24 2026 03/14/24 2045 03/14/24 2112 03/14/24 2200  BP:  (!) 145/105  (!) 155/96  Pulse:  (!) 155  83  Resp:  (!) 30  (!) 24  Temp:      SpO2:  96% 100% 100%  Weight: 122.9 kg     Height: 6' (1.829 m)       Gen: Awake, alert, chronically  ill appearing.   CV: tachycardic, irregular, normal S1, S2, no murmurs  Resp: improved resp distress, on BiPAP, rales in the bases.  Abd: Flat, normoactive, nontender MSK: Asymmetric edema L 2-3+, R 1+ (pt states chronic).  Skin: No rashes or lesions to exposed skin  Neuro: Alert and interactive  Psych: euthymic, appropriate    Data review:   Labs reviewed, notable for:   7.27 / 24  K 4.7, Biarb 12, AG 24, Cr 14  Hs trop 64 r/pt pending  WBC 10 , hb 10   Micro:  Results for orders placed or performed during the hospital encounter of 12/23/23  Blood Culture (routine x 2)     Status: None   Collection Time: 12/23/23  8:52 AM   Specimen: BLOOD  Result Value Ref Range Status   Specimen Description BLOOD BLOOD RIGHT ARM  Final   Special Requests   Final    BOTTLES DRAWN AEROBIC AND ANAEROBIC Blood Culture adequate volume   Culture   Final    NO GROWTH 5 DAYS Performed at [facility], [address] [place], [place] [address]    Report Status 12/28/2023 FINAL  Final  Blood Culture (routine x 2)     Status: None   Collection Time: 12/23/23 10:25 AM   Specimen: BLOOD  Result Value Ref Range Status   Specimen Description BLOOD RIGHT [facility]  Final   Special Requests   Final    BOTTLES DRAWN AEROBIC AND ANAEROBIC Blood Culture adequate volume   Culture   Final    NO GROWTH 5 DAYS Performed at [facility], [address] [place], [place] [address]    Report Status 12/28/2023 FINAL  Final  MRSA Next Gen by PCR, Nasal     Status: None   Collection Time: 12/23/23  5:00 PM   Specimen: Nasal Mucosa; Nasal Swab  Result Value Ref Range Status   MRSA by PCR Next Gen NOT DETECTED NOT DETECTED Final    Comment: (NOTE) The GeneXpert MRSA Assay (FDA approved for NASAL specimens only), is one component of a comprehensive MRSA colonization surveillance program. It is not intended to diagnose MRSA infection nor to guide or monitor treatment for MRSA infections. Test  performance is not FDA approved in patients less than 73 years old. Performed at [name], [address] [place], [place] [address]   Respiratory (~20 pathogens) panel by PCR     Status: None   Collection Time: 12/23/23  6:02 PM   Specimen: Nasopharyngeal Swab; Respiratory  Result Value Ref Range Status   Adenovirus NOT DETECTED  NOT DETECTED Final   Coronavirus 229E NOT DETECTED NOT DETECTED Final    Comment: (NOTE) The Coronavirus on the Respiratory Panel, DOES NOT test for the novel  Coronavirus (2019 nCoV)    Coronavirus HKU1 NOT DETECTED NOT DETECTED Final   Coronavirus NL63 NOT DETECTED NOT DETECTED Final   Coronavirus OC43 NOT DETECTED NOT DETECTED Final   Metapneumovirus NOT DETECTED NOT DETECTED Final   Rhinovirus / Enterovirus NOT DETECTED NOT DETECTED Final   Influenza A NOT DETECTED NOT DETECTED Final   Influenza B NOT DETECTED NOT DETECTED Final   Parainfluenza Virus 1 NOT DETECTED NOT DETECTED Final   Parainfluenza Virus 2 NOT DETECTED NOT DETECTED Final   Parainfluenza Virus 3 NOT DETECTED NOT DETECTED Final   Parainfluenza Virus 4 NOT DETECTED NOT DETECTED Final   Respiratory Syncytial Virus NOT DETECTED NOT DETECTED Final   Bordetella pertussis NOT DETECTED NOT DETECTED Final   Bordetella Parapertussis NOT DETECTED NOT DETECTED Final   Chlamydophila pneumoniae NOT DETECTED NOT DETECTED Final   Mycoplasma pneumoniae NOT DETECTED NOT DETECTED Final    Comment: Performed at [facility], [address], [place], [place] [address]  Resp panel by RT-PCR (RSV, Flu A&B, Covid) Anterior Nasal Swab     Status: None   Collection Time: 12/24/23  6:01 PM   Specimen: Anterior Nasal Swab  Result Value Ref Range Status   SARS Coronavirus 2 by RT PCR NEGATIVE NEGATIVE Final    Comment: (NOTE) SARS-CoV-2 target nucleic acids are NOT DETECTED.  The SARS-CoV-2 RNA is generally detectable in upper respiratory specimens during the acute phase of infection. The  lowest concentration of SARS-CoV-2 viral copies this assay can detect is 138 copies/mL. A negative result does not preclude SARS-Cov-2 infection and should not be used as the sole basis for treatment or other patient management decisions. A negative result may occur with  improper specimen collection/handling, submission of specimen other than nasopharyngeal swab, presence of viral mutation(s) within the areas targeted by this assay, and inadequate number of viral copies(<138 copies/mL). A negative result must be combined with clinical observations, patient history, and epidemiological information. The expected result is Negative.  Fact Sheet for Patients:  BloggerCourse.com  Fact Sheet for Healthcare Providers:  SeriousBroker.it  This test is no t yet approved or cleared by the [place]  FDA and  has been authorized for detection and/or diagnosis of SARS-CoV-2 by FDA under an Emergency Use Authorization (EUA). This EUA will remain  in effect (meaning this test can be used) for the duration of the COVID-19 declaration under Section 564(b)(1) of the Act, 21 U.S.C.section 360bbb-3(b)(1), unless the authorization is terminated  or revoked sooner.       Influenza A by PCR NEGATIVE NEGATIVE Final   Influenza B by PCR NEGATIVE NEGATIVE Final    Comment: (NOTE) The Xpert Xpress SARS-CoV-2/FLU/RSV plus assay is intended as an aid in the diagnosis of influenza from Nasopharyngeal swab specimens and should not be used as a sole basis for treatment. Nasal washings and aspirates are unacceptable for Xpert Xpress SARS-CoV-2/FLU/RSV testing.  Fact Sheet for Patients: BloggerCourse.com  Fact Sheet for Healthcare Providers: SeriousBroker.it  This test is not yet approved or cleared by the [place]  FDA and has been authorized for detection and/or diagnosis of SARS-CoV-2 by FDA under  an Emergency Use Authorization (EUA). This EUA will remain in effect (meaning this test can be used) for the duration of the COVID-19 declaration under Section 564(b)(1) of the Act, 21 U.S.C.  section 360bbb-3(b)(1), unless the authorization is terminated or revoked.     Resp Syncytial Virus by PCR NEGATIVE NEGATIVE Final    Comment: (NOTE) Fact Sheet for Patients: BloggerCourse.com  Fact Sheet for Healthcare Providers: SeriousBroker.it  This test is not yet approved or cleared by the [place]  FDA and has been authorized for detection and/or diagnosis of SARS-CoV-2 by FDA under an Emergency Use Authorization (EUA). This EUA will remain in effect (meaning this test can be used) for the duration of the COVID-19 declaration under Section 564(b)(1) of the Act, 21 U.S.C. section 360bbb-3(b)(1), unless the authorization is terminated or revoked.  Performed at [facility], [address] [place], [place] [address]    *Note: Due to a large number of results and/or encounters for the requested time period, some results have not been displayed. A complete set of results can be found in Results Review.    Imaging reviewed:  DG Chest Portable 1 View Result Date: 03/14/2024 EXAM: 1 VIEW XRAY OF THE CHEST 03/14/2024 09:23:00 PM COMPARISON: 12/23/2023 CLINICAL HISTORY: Chest pain, dyspnea. The patient has missed dialysis for one week, is complaining of being unable to breathe, has a fistula in the left arm, low O2 sats, rapid heart rate of 150, and nasal O2 placed. FINDINGS: LUNGS AND PLEURA: No focal pulmonary opacity. No pulmonary edema. No pleural effusion. No pneumothorax. Bibasilar atelectasis. HEART AND MEDIASTINUM: Mild cardiomegaly, unchanged. BONES AND SOFT TISSUES: No acute osseous abnormality. IMPRESSION: 1. Mild cardiomegaly, unchanged. 2. Bibasilar atelectasis. Electronically signed by: Franky Stanford MD 03/14/2024 09:32 PM  EDT RP Workstation: [id number]    EKG:  Personally reviewed, Aflutter with 2:1 conduction, diffuse ST depression. V 1 STE without contiguous lead elevation    ED Course:  Placed on BiPAP for WOB. Nephrology Dr. Melia consulted and plan for HD when available tonight. Consulted cardiology, to see for arrhythmia management, recommending admiodarone gtt.     Assessment/Plan:  53 y.o. male with hx ESRD on TT HD, Afib on AC, prior CVA, HTN, HLD, DM, Hypothyroidism, who presented due to progressive SOB. Found to have volume overload with respiratory failure related to missed HD sessions. Also c/b Afib/flutter with RVR and likely demand ischemia.   Volume overload, hypoxic respiratory failure  AGMA  ESRD, on TT HD, missed HD session x 1 week  - Nephrology Dr. Melia consulted and plan for HD when available tonight. - Continue BiPAP overnight, NPO while on BiPAP  - Lasix  80 mg IV x 1, tentatively scheduled for 40 mg IV BID tomorrow   Afib/flutter with rapid ventricular response  P/w rates in 150s, intermittent wide complex tachycardia, ? Abberrancy.  - EDP consulted cardiology fellow, to see patient.  - Continue amiodarone  gtt - Continue home Metoprolol  37.5 mg BID, Diltiazem  120 mg daily, and PO amio 200 mg BID  - Continue home Eliquis  5 mg BID  - Check TSH   Acute myocardial injury  No chest pain. EKG with diffuse ST depression associated with tachyarrhythmia. Likely demand ischemia in setting of arrhythmia / resp failure and volume overload  - Management directed at arrhythmia / resp failure per above   Chronic medical problems:  Hx CVA: on DOAC monotherapy, not on cholesterol lowering agent  HTN: See afib above. Also continue home Hydralazine  25 mg BID.  DM: Last A1c 6.2%. SSI for very sensitive  Hypothyroidism: Continue home Levothyroxine . Check TSH    Body mass index is 36.75 kg/m. Obesity class II would benefit from weight loss outpatient  DVT prophylaxis:  Eliquis  Code Status:   Full Code Diet:  Diet Orders (From admission, onward)     Start     Ordered   03/14/24 2226  Diet NPO time specified Except for: Ice Chips, Sips with Meds  Diet effective now       Comments: NPO until off BiPAP, notify provider once off for change in diet  Question Answer Comment  Except for Ice Chips   Except for Sips with Meds      03/14/24 2226           Family Communication:  None   Consults:  Nephrology, cardiology   Admission status:   Inpatient, Step Down Unit  Severity of Illness: The appropriate patient status for this patient is INPATIENT. Inpatient status is judged to be reasonable and necessary in order to provide the required intensity of service to ensure the patient's safety. The patient's presenting symptoms, physical exam findings, and initial radiographic and laboratory data in the context of their chronic comorbidities is felt to place them at high risk for further clinical deterioration. Furthermore, it is not anticipated that the patient will be medically stable for discharge from the hospital within 2 midnights of admission.   * I certify that at the point of admission it is my clinical judgment that the patient will require inpatient hospital care spanning beyond 2 midnights from the point of admission due to high intensity of service, high risk for further deterioration and high frequency of surveillance required.*   Dorn Dawson, MD [facility]  How to contact the [facility] Attending or Consulting provider 7A - 7P or covering provider during after hours 7P -7A, for this patient.  Check the care team in [facility] and look for a) attending/consulting [facility] provider listed and b) the [facility] team listed Log into [email or web address] and use [place]'s universal password to access. If you do not have the password, please contact the hospital operator. Locate the [facility] provider you are looking for under [facility] and page to a number that you can be directly  reached. If you still have difficulty reaching the provider, please page the [facility] (Director on Call) for the Hospitalists listed on amion for assistance.  03/14/2024, 10:46 PM

## 2024-03-15 ENCOUNTER — Inpatient Hospital Stay (HOSPITAL_COMMUNITY)

## 2024-03-15 ENCOUNTER — Ambulatory Visit: Payer: Medicare Other

## 2024-03-15 DIAGNOSIS — N186 End stage renal disease: Secondary | ICD-10-CM

## 2024-03-15 DIAGNOSIS — E8770 Fluid overload, unspecified: Secondary | ICD-10-CM | POA: Diagnosis not present

## 2024-03-15 DIAGNOSIS — J9601 Acute respiratory failure with hypoxia: Secondary | ICD-10-CM | POA: Diagnosis not present

## 2024-03-15 DIAGNOSIS — I480 Paroxysmal atrial fibrillation: Secondary | ICD-10-CM | POA: Diagnosis not present

## 2024-03-15 DIAGNOSIS — Z992 Dependence on renal dialysis: Secondary | ICD-10-CM

## 2024-03-15 DIAGNOSIS — I214 Non-ST elevation (NSTEMI) myocardial infarction: Secondary | ICD-10-CM

## 2024-03-15 DIAGNOSIS — I4891 Unspecified atrial fibrillation: Secondary | ICD-10-CM | POA: Diagnosis not present

## 2024-03-15 LAB — I-STAT ARTERIAL BLOOD GAS, ED
Acid-base deficit: 3 mmol/L — ABNORMAL HIGH (ref 0.0–2.0)
Bicarbonate: 20.7 mmol/L (ref 20.0–28.0)
Calcium, Ion: 0.94 mmol/L — ABNORMAL LOW (ref 1.15–1.40)
HCT: 31 % — ABNORMAL LOW (ref 39.0–52.0)
Hemoglobin: 10.5 g/dL — ABNORMAL LOW (ref 13.0–17.0)
O2 Saturation: 100 %
Patient temperature: 98.6
Potassium: 3.6 mmol/L (ref 3.5–5.1)
Sodium: 139 mmol/L (ref 135–145)
TCO2: 22 mmol/L (ref 22–32)
pCO2 arterial: 30.2 mmHg — ABNORMAL LOW (ref 32–48)
pH, Arterial: 7.444 (ref 7.35–7.45)
pO2, Arterial: 172 mmHg — ABNORMAL HIGH (ref 83–108)

## 2024-03-15 LAB — CBC WITH DIFFERENTIAL/PLATELET
Abs Immature Granulocytes: 0.07 K/uL (ref 0.00–0.07)
Basophils Absolute: 0 K/uL (ref 0.0–0.1)
Basophils Relative: 0 %
Eosinophils Absolute: 0 K/uL (ref 0.0–0.5)
Eosinophils Relative: 0 %
HCT: 26.2 % — ABNORMAL LOW (ref 39.0–52.0)
Hemoglobin: 9 g/dL — ABNORMAL LOW (ref 13.0–17.0)
Immature Granulocytes: 1 %
Lymphocytes Relative: 2 %
Lymphs Abs: 0.3 K/uL — ABNORMAL LOW (ref 0.7–4.0)
MCH: 31.9 pg (ref 26.0–34.0)
MCHC: 34.4 g/dL (ref 30.0–36.0)
MCV: 92.9 fL (ref 80.0–100.0)
Monocytes Absolute: 0.6 K/uL (ref 0.1–1.0)
Monocytes Relative: 5 %
Neutro Abs: 9.7 K/uL — ABNORMAL HIGH (ref 1.7–7.7)
Neutrophils Relative %: 92 %
Platelets: 139 K/uL — ABNORMAL LOW (ref 150–400)
RBC: 2.82 MIL/uL — ABNORMAL LOW (ref 4.22–5.81)
RDW: 13.1 % (ref 11.5–15.5)
WBC: 10.6 K/uL — ABNORMAL HIGH (ref 4.0–10.5)
nRBC: 0.3 % — ABNORMAL HIGH (ref 0.0–0.2)

## 2024-03-15 LAB — TROPONIN I (HIGH SENSITIVITY)
Troponin I (High Sensitivity): 4302 ng/L (ref <18)
Troponin I (High Sensitivity): 6025 ng/L (ref <18)
Troponin I (High Sensitivity): 7307 ng/L (ref <18)

## 2024-03-15 LAB — CBC
HCT: 29.1 % — ABNORMAL LOW (ref 39.0–52.0)
Hemoglobin: 9.6 g/dL — ABNORMAL LOW (ref 13.0–17.0)
MCH: 31.7 pg (ref 26.0–34.0)
MCHC: 33 g/dL (ref 30.0–36.0)
MCV: 96 fL (ref 80.0–100.0)
Platelets: 151 K/uL (ref 150–400)
RBC: 3.03 MIL/uL — ABNORMAL LOW (ref 4.22–5.81)
RDW: 13.4 % (ref 11.5–15.5)
WBC: 10.8 K/uL — ABNORMAL HIGH (ref 4.0–10.5)
nRBC: 0 % (ref 0.0–0.2)

## 2024-03-15 LAB — PHOSPHORUS
Phosphorus: >30 mg/dL — ABNORMAL HIGH (ref 2.5–4.6)
Phosphorus: 6.4 mg/dL — ABNORMAL HIGH (ref 2.5–4.6)

## 2024-03-15 LAB — COMPREHENSIVE METABOLIC PANEL WITH GFR
ALT: 12 U/L (ref 0–44)
AST: 21 U/L (ref 15–41)
Albumin: 3.3 g/dL — ABNORMAL LOW (ref 3.5–5.0)
Alkaline Phosphatase: 59 U/L (ref 38–126)
Anion gap: 20 — ABNORMAL HIGH (ref 5–15)
BUN: 93 mg/dL — ABNORMAL HIGH (ref 6–20)
CO2: 18 mmol/L — ABNORMAL LOW (ref 22–32)
Calcium: 7.5 mg/dL — ABNORMAL LOW (ref 8.9–10.3)
Chloride: 101 mmol/L (ref 98–111)
Creatinine, Ser: 10.01 mg/dL — ABNORMAL HIGH (ref 0.61–1.24)
GFR, Estimated: 6 mL/min — ABNORMAL LOW (ref >60)
Glucose, Bld: 113 mg/dL — ABNORMAL HIGH (ref 70–99)
Potassium: 3.8 mmol/L (ref 3.5–5.1)
Sodium: 139 mmol/L (ref 135–145)
Total Bilirubin: 1.1 mg/dL (ref 0.0–1.2)
Total Protein: 6.8 g/dL (ref 6.5–8.1)

## 2024-03-15 LAB — TSH: TSH: 6.757 u[IU]/mL — ABNORMAL HIGH (ref 0.350–4.500)

## 2024-03-15 LAB — CBG MONITORING, ED: Glucose-Capillary: 108 mg/dL — ABNORMAL HIGH (ref 70–99)

## 2024-03-15 LAB — BASIC METABOLIC PANEL WITH GFR
Anion gap: 23 — ABNORMAL HIGH (ref 5–15)
BUN: 158 mg/dL — ABNORMAL HIGH (ref 6–20)
CO2: 12 mmol/L — ABNORMAL LOW (ref 22–32)
Calcium: 7 mg/dL — ABNORMAL LOW (ref 8.9–10.3)
Chloride: 107 mmol/L (ref 98–111)
Creatinine, Ser: 14.62 mg/dL — ABNORMAL HIGH (ref 0.61–1.24)
GFR, Estimated: 4 mL/min — ABNORMAL LOW (ref >60)
Glucose, Bld: 160 mg/dL — ABNORMAL HIGH (ref 70–99)
Potassium: 4.9 mmol/L (ref 3.5–5.1)
Sodium: 142 mmol/L (ref 135–145)

## 2024-03-15 LAB — GLUCOSE, CAPILLARY: Glucose-Capillary: 108 mg/dL — ABNORMAL HIGH (ref 70–99)

## 2024-03-15 LAB — T4, FREE: Free T4: 1.1 ng/dL (ref 0.61–1.12)

## 2024-03-15 LAB — D-DIMER, QUANTITATIVE: D-Dimer, Quant: 1.25 ug{FEU}/mL — ABNORMAL HIGH (ref 0.00–0.50)

## 2024-03-15 LAB — MAGNESIUM: Magnesium: 2.6 mg/dL — ABNORMAL HIGH (ref 1.7–2.4)

## 2024-03-15 MED ORDER — SEVELAMER CARBONATE 800 MG PO TABS: 2400.0000 mg | ORAL_TABLET | Freq: Three times a day (TID) | ORAL | Status: DC

## 2024-03-15 MED ORDER — HEPARIN SODIUM (PORCINE) 1000 UNIT/ML IJ SOLN
INTRAMUSCULAR | Status: AC
  Filled 2024-03-15: qty 1

## 2024-03-15 MED ORDER — AMIODARONE HCL 200 MG PO TABS
200.0000 mg | ORAL_TABLET | Freq: Two times a day (BID) | ORAL | Status: DC
  Administered 2024-03-15 – 2024-03-18 (×6): 200 mg via ORAL
  Filled 2024-03-15 (×6): qty 1

## 2024-03-15 MED ORDER — HEPARIN (PORCINE) 25000 UT/250ML-% IV SOLN
1800.0000 [IU]/h | INTRAVENOUS | Status: DC
  Administered 2024-03-15: 1400 [IU]/h via INTRAVENOUS
  Administered 2024-03-16: 1750 [IU]/h via INTRAVENOUS
  Administered 2024-03-17: 1800 [IU]/h via INTRAVENOUS
  Filled 2024-03-15 (×3): qty 250

## 2024-03-15 NOTE — Progress Notes (Signed)
 Pt was transported to HD via BiPAP from ED with no apparent complications. Pt is VSS

## 2024-03-15 NOTE — ED Notes (Signed)
 Called and placed PT on monitor with CCMD.

## 2024-03-15 NOTE — Progress Notes (Signed)
 PROGRESS NOTE    Gregory Crane  FMW:[id number] DOB: 05-11-1971 DOA: 03/14/2024 PCP: Glendia Shad, MD  Outpatient Specialists:     Brief Narrative:  Patient is a 53 year old male, morbidly obese, with past medical history significant for end-stage renal disease on hemodialysis TTS, hypertension, diabetes mellitus, atrial fibrillation on anticoagulation, prior CVA, hyperlipidemia and hypothyroidism.  Patient snores, but has not been diagnosed with OSA nor worked up for OSA.  Prior to presentation, patient had missed hemodialysis for about a week.  Apparently, patient was hoping to try something different (divine intervention).  Patient presented with shortness of breath and volume overload.  Patient underwent hemodialysis earlier today await close to 2 L ultrafiltration.  After hemodialysis, patient was said to have gone into respiratory trace requiring BiPAP.  ABG done was not suggestive of pulmonary etiology for the shortness of breath after hemodialysis.  However, troponin trend has been [phone].  Cardiology team has been consulted.  Also discussed personally with Dr. Sheena, cardiologist.  03/15/2024: Patient seen.  Above documentation noted.  Repeat lab work after dialysis revealed sodium of 139, potassium of 3.8, chloride 101, CO2 of 18, BUN of 93, creatinine of 10.01, phosphorus of 6.4, blood sugar of 113.  Repeat troponin is 6025.  Assessment & Plan:   Principal Problem:   Hypervolemia Active Problems:   Atrial fibrillation with rapid ventricular response (HCC)   Hypoxic respiratory failure (HCC)   Demand ischemia (HCC)   Volume overload, hypoxic respiratory failure  [facility]  ESRD, on TT HD, missed HD session x 1 week  - Patient has undergone hemodialysis with 2 L ultrafiltration. - Acidosis is improving.  CO2 has gone up from 12-18. - Peripheral edema persists. - Volume may be difficult to assess in this patient, considering body habitus.   - Patient was noncompliant with  hemodialysis for about a week. - Elevated phosphorus noted. - Hemoglobin of 9 g/dL.  Will defer to nephrology team.  MCV of 92.9.  Blood pressure to check folate and vitamin B12 level.  Severe respiratory distress after hemodialysis: -Patient is currently on BiPAP. - ABG revealed pH of 7.44, PCO2 of 30 and PO2 of 172. - Troponin has risen from [phone]. - Possibly secondary to NSTEMI (shortness of breath may have been angina equivalent in this patient, with no history of diabetes mellitus). - Discussed with cardiology team. - Further management as per the cardiology team. - Follow echocardiogram.   Afib/flutter with rapid ventricular response  P/w rates in 150s, intermittent wide complex tachycardia, ? Abberrancy.  - EDP consulted cardiology fellow, to see patient.  - Continue amiodarone  gtt - Continue home Metoprolol  37.5 mg BID, Diltiazem  120 mg daily, and PO amio 200 mg BID  - Continue home Eliquis  5 mg BID  - Check TSH  03/15/2024: Heart rate is controlled.     Chronic medical problems:  Hx CVA: on DOAC monotherapy, not on cholesterol lowering agent  HTN: See afib above. Also continue home Hydralazine  25 mg BID.  DM: Last A1c 6.2%. SSI for very sensitive  Hypothyroidism: Continue home Levothyroxine . Check TSH        DVT prophylaxis: Eliquis  Code Status: Full code Family Communication: Sister Disposition Plan: Inpatient   Consultants:  Nephrology Cardiology  Procedures:  None  Antimicrobials:  None   Subjective: Shortness of breath  Objective: Vitals:   03/15/24 0854 03/15/24 0911 03/15/24 0926 03/15/24 0941  BP: (!) 148/81 (!) 158/88 (!) 143/77 (!) 165/79  Pulse: 71 83 68 69  Resp: ROLLEN)  23 15 14 14   Temp:      TempSrc:      SpO2: 100% 95% 98% 98%  Weight:      Height:        Intake/Output Summary (Last 24 hours) at 03/15/2024 1026 Last data filed at 03/15/2024 0659 Gross per 24 hour  Intake 229.18 ml  Output --  Net 229.18 ml   Filed Weights    03/14/24 2026 03/15/24 0841  Weight: 122.9 kg 122.9 kg    Examination:  General exam: Patient is morbidly obese.  Patient is currently, on BiPAP.  Respiratory distress seems to have improved significantly.    Respiratory system: Clear to auscultation.  Cardiovascular system: S1 & S2  Gastrointestinal system: Abdomen is morbidly obese and nontender.   Central nervous system: Awake and alert.   Extremities: Lower extremity edema.  Data Reviewed: I have personally reviewed following labs and imaging studies  CBC: Recent Labs  Lab 03/14/24 2029 03/14/24 2210 03/15/24 0529  WBC 10.6*  --  10.8*  HGB 10.2* 8.5* 9.6*  HCT 30.7* 25.0* 29.1*  MCV 94.8  --  96.0  PLT 160  --  151   Basic Metabolic Panel: Recent Labs  Lab 03/14/24 2029 03/14/24 2210 03/15/24 0529  NA 142 141 142  K 4.7 4.6 4.9  CL 106  --  107  CO2 12*  --  12*  GLUCOSE 147*  --  160*  BUN 154*  --  158*  CREATININE 14.73*  --  14.62*  CALCIUM  7.0*  --  7.0*  MG 2.6*  --  2.6*  PHOS  --   --  >30.0*   GFR: Estimated Creatinine Clearance: 7.9 mL/min (A) (by C-G formula based on SCr of 14.62 mg/dL (H)). Liver Function Tests: No results for input(s): AST, ALT, ALKPHOS, BILITOT, PROT, ALBUMIN  in the last 168 hours. No results for input(s): LIPASE, AMYLASE in the last 168 hours. No results for input(s): AMMONIA in the last 168 hours. Coagulation Profile: No results for input(s): INR, PROTIME in the last 168 hours. Cardiac Enzymes: No results for input(s): CKTOTAL, CKMB, CKMBINDEX, TROPONINI in the last 168 hours. BNP (last 3 results) No results for input(s): PROBNP in the last 8760 hours. HbA1C: No results for input(s): HGBA1C in the last 72 hours. CBG: No results for input(s): GLUCAP in the last 168 hours. Lipid Profile: No results for input(s): CHOL, HDL, LDLCALC, TRIG, CHOLHDL, LDLDIRECT in the last 72 hours. Thyroid  Function Tests: Recent Labs     03/15/24 0529  TSH 6.757*   Anemia Panel: No results for input(s): VITAMINB12, FOLATE, FERRITIN, TIBC, IRON, RETICCTPCT in the last 72 hours. Urine analysis:    Component Value Date/Time   COLORURINE YELLOW (A) 08/22/2022 0042   APPEARANCEUR Clear 10/30/2022 1446   LABSPEC 1.012 08/22/2022 0042   LABSPEC 1.033 01/27/2014 0247   PHURINE 5.0 08/22/2022 0042   GLUCOSEU Trace (A) 10/30/2022 1446   GLUCOSEU 100 (A) 05/08/2022 1101   HGBUR SMALL (A) 08/22/2022 0042   BILIRUBINUR Negative 10/30/2022 1446   BILIRUBINUR Negative 01/27/2014 0247   KETONESUR NEGATIVE 08/22/2022 0042   PROTEINUR 3+ (A) 10/30/2022 1446   PROTEINUR >=300 (A) 08/22/2022 0042   UROBILINOGEN 0.2 05/08/2022 1101   NITRITE Negative 10/30/2022 1446   NITRITE NEGATIVE 08/22/2022 0042   LEUKOCYTESUR Negative 10/30/2022 1446   LEUKOCYTESUR NEGATIVE 08/22/2022 0042   LEUKOCYTESUR Negative 01/27/2014 0247   Sepsis Labs: @LABRCNTIP (procalcitonin:4,lacticidven:4)  )No results found for this or any previous visit (from the  past 240 hours).       Radiology Studies: DG Chest Portable 1 View Result Date: 03/14/2024 EXAM: 1 VIEW XRAY OF THE CHEST 03/14/2024 09:23:00 PM COMPARISON: 12/23/2023 CLINICAL HISTORY: Chest pain, dyspnea. The patient has missed dialysis for one week, is complaining of being unable to breathe, has a fistula in the left arm, low O2 sats, rapid heart rate of 150, and nasal O2 placed. FINDINGS: LUNGS AND PLEURA: No focal pulmonary opacity. No pulmonary edema. No pleural effusion. No pneumothorax. Bibasilar atelectasis. HEART AND MEDIASTINUM: Mild cardiomegaly, unchanged. BONES AND SOFT TISSUES: No acute osseous abnormality. IMPRESSION: 1. Mild cardiomegaly, unchanged. 2. Bibasilar atelectasis. Electronically signed by: Franky Stanford MD 03/14/2024 09:32 PM EDT RP Workstation: [id number]        Scheduled Meds:  amiodarone   200 mg Oral BID   apixaban   5 mg Oral BID   Chlorhexidine   Gluconate Cloth  6 each Topical Q0600   diltiazem   120 mg Oral Daily   dorzolamide -timolol   1 drop Both Eyes BID   furosemide   40 mg Intravenous BID   hydrALAZINE   25 mg Oral BID   insulin  aspart  0-6 Units Subcutaneous TID WC   levothyroxine   175 mcg Oral Q0600   metoprolol  tartrate  37.5 mg Oral BID   pregabalin   50-100 mg Oral QHS   rosuvastatin   5 mg Oral Daily   sodium chloride  flush  3 mL Intravenous Q12H   Continuous Infusions:  amiodarone  30 mg/hr (03/15/24 0659)     LOS: 1 day    Time spent: 55 minutes.    Leatrice Chapel, MD  [facility] Pager #: [phone] 7PM-7AM contact night coverage as above

## 2024-03-15 NOTE — Progress Notes (Addendum)
 Paged by Dr Sheena, states patient was seen previously for A fib RVR, Hs trop was 64>381, now 6025. She has recommended to stop Eliquis  and start heparin  gtt.   Patient seen at bedside, sleeping with BIPAP, easily awakened. He states he never had any chest pain, feels well, no SOB or dizziness. He is in sinus rhythm 60s. He has no acute distress.  He denied hx of MI. He has been HD for 3 years and has DM, denied tobacco use or any bleeding history. Explained above intervention recommended by Dr Sheena to the patient and family. Further cardiac workup per Dr Sheena.

## 2024-03-15 NOTE — Progress Notes (Signed)
 Pt was transported to 5W from ED via BiPAP. Pt tolerated transport well with no apparent complications. Pt VSS at this time.

## 2024-03-15 NOTE — Progress Notes (Addendum)
 Pt has been receiving out-pt HD at [facility] on TTS 7:00 am chair time. Clinic staff advised navigator that pt normally does not attend Saturday treatments and told staff last week that he plans to stop HD. This info was provided to nephrologist. Clinic staff to fax pt's HD orders and meds to inpt HD unit per request of nephrologist for review. Update will need to be provided to pt's HD clinic as to what pt's intentions are regarding HD at d/c prior to pt's d/c. Will assist as needed.   Randine Mungo Renal Navigator [phone]

## 2024-03-15 NOTE — ED Notes (Signed)
 RT called and placed back on Bipap

## 2024-03-15 NOTE — Consult Note (Addendum)
 Cardiology Consultation   Patient ID: Gregory Crane MRN: [id number]; DOB: [birth year]  Admit date: 03/14/2024 Date of Consult: 03/15/2024  PCP:  Glendia Shad, MD   [facility] Providers Cardiologist:  Lonni LITTIE Nanas, MD        Patient Profile: Gregory Crane is a 53 y.o. male with a hx of type 2 diabetes, cirrhosis, hypertension, stroke, atrial fibrillation, ESRD who is being seen 03/15/2024 for the evaluation of dyspnea and elevated troponin at the request of Dr. Rosario.  History of Present Illness: Gregory Crane presented to the emergency department on 7/20 with symptoms of worsening shortness of breath.  The symptoms followed a 10-day gap in dialysis sessions.  On initial presentation in the emergency department, patient with evidence of hyperkalemia and respiratory failure.  He was also noted to be in atrial flutter with rapid ventricular response.  Given tachyarrhythmia, patient was started on amiodarone .  While in the emergency department, patient noted to have increased work of breathing and was ultimately placed on BiPAP for airway management.  Nephrology was also consulted to assist with volume management.  Given atrial flutter with rapid ventricular response as well as persistent dyspnea despite HD session today, cardiology consulted.  On exam in the ED, patient still wearing BiPAP but with minimal pressure support. Reports feeling significantly improved compared to earlier today. Denies cardiac symptoms such as chest pain and palpitations prior to or during this admission.  Of note, has a history of cryptogenic stroke in 2019. In 2019, echo showed EF 60 to 65%, G1 DD, normal RV function, no significant valvular disease. Loop recorder placement for 3 years showing no significant arrhythmias/atrial fibrillation, loop removed 12/2021.  Hospitalized 06/2023 after hemodialysis team noted him to be in atrial fibrillation with RVR.  Troponin was mildly elevated at 236, felt  to be a supply/demand mismatch in the setting of ESRD and atrial fibrillation with RVR.  Echo showed EF 50 to 55% with no RWMA.  He converted to sinus rhythm on diltiazem  drip and oral metoprolol  every 6 hours.  He was started on Eliquis  5 mg twice daily for CHA2DS2-VASc of at least 4. Metoprolol  was reduced to tartrate 25 mg twice daily in the setting of bradycardia. It was recommended that he follow-up with EP for consideration of antiarrhythmic versus ablation, although it does not appear that this appointment was completed.  Patient was lost to follow-up in our clinic.   Patient was most recently evaluated by our cardiology team in April 2025 after being admitted with acute dyspnea.  This admission was complicated by runs of atrial tachycardia/SVT and patient was treated with IV amiodarone  while admitted.  Per discharge notes from that admission, patient was to continue oral amiodarone , 200 mg twice daily along with Cardizem  CD1 120 mg and metoprolol  tartrate 37.5 mg twice daily.    Past Medical History:  Diagnosis Date   Allergy    Anemia    Bell's palsy    Diabetes mellitus without complication (HCC)    diet controlled   Fall 07/2023   Hypertension    Hypothyroidism    Kidney stones    Pseudotumor cerebri    Stroke [facility])     Past Surgical History:  Procedure Laterality Date   COLONOSCOPY WITH PROPOFOL  N/A 03/30/2020   Procedure: COLONOSCOPY WITH PROPOFOL ;  Surgeon: Maryruth Ole DASEN, MD;  Location: [facility] ENDOSCOPY;  Service: Endoscopy;  Laterality: N/A;   LOOP RECORDER INSERTION N/A 01/27/2018   Procedure: LOOP RECORDER INSERTION;  Surgeon:  Fernande Elspeth BROCKS, MD;  Location: [facility] INVASIVE CV LAB;  Service: Cardiovascular;  Laterality: N/A;   LUMBAR PUNCTURE     as child   NO PAST SURGERIES     REMOVAL OF A DIALYSIS CATHETER     TEE WITHOUT CARDIOVERSION N/A 01/07/2018   Procedure: TRANSESOPHAGEAL ECHOCARDIOGRAM (TEE);  Surgeon: Gollan, Timothy J, MD;  Location: [facility] ORS;  Service:  Cardiovascular;  Laterality: N/A;     Home Medications:  Prior to Admission medications   Medication Sig Start Date End Date Taking? Authorizing Provider  apixaban  (ELIQUIS ) 5 MG TABS tablet Take 1 tablet (5 mg total) by mouth 2 (two) times daily. 07/03/23  Yes Awanda City, MD  calcium  carbonate (TUMS - DOSED IN MG ELEMENTAL CALCIUM ) 500 MG chewable tablet Chew 2 tablets by mouth 3 (three) times daily with meals.   Yes [provider]  dorzolamide -timolol  (COSOPT ) 2-0.5 % ophthalmic solution Place 1 drop into both eyes 2 (two) times daily. 11/26/23  Yes [provider]  furosemide  (LASIX ) 40 MG tablet Take 1 tablet (40 mg total) by mouth 2 (two) times daily. 07/03/23  Yes Awanda City, MD  hydrALAZINE  (APRESOLINE ) 25 MG tablet Take 25 mg by mouth 2 (two) times daily. 11/05/23  Yes [provider]  levothyroxine  (SYNTHROID ) 175 MCG tablet TAKE 1 TABLET (175 MCG) BY MOUTH ONCE DAILY BEFORE BREAKFAST. 10/09/23  Yes Glendia Shad, MD  metoprolol  tartrate 37.5 MG TABS Take 1 tablet (37.5 mg total) by mouth 2 (two) times daily. 12/26/23  Yes Caleen Qualia, MD  pregabalin  (LYRICA ) 50 MG capsule 1 -2 tablets q hs Patient taking differently: Take 100 mg by mouth at bedtime. 01/30/24  Yes Glendia Shad, MD  amiodarone  (PACERONE ) 200 MG tablet Take 1 tablet (200 mg total) by mouth 2 (two) times daily. Patient not taking: Reported on 03/15/2024 12/26/23   Caleen Qualia, MD  diltiazem  (CARDIZEM  CD) 120 MG 24 hr capsule Take 1 capsule (120 mg total) by mouth daily. 12/26/23   Amin, Sumayya, MD  RENVELA  800 MG tablet Take 800 mg by mouth 3 (three) times daily. Patient not taking: Reported on 03/15/2024 04/23/22   [provider]  rosuvastatin  (CRESTOR ) 5 MG tablet Take 5 mg by mouth daily. Patient not taking: Reported on 03/15/2024    [provider]    Scheduled Meds:  amiodarone   200 mg Oral BID   apixaban   5 mg Oral BID   Chlorhexidine  Gluconate Cloth  6 each Topical Q0600    diltiazem   120 mg Oral Daily   dorzolamide -timolol   1 drop Both Eyes BID   furosemide   40 mg Intravenous BID   hydrALAZINE   25 mg Oral BID   insulin  aspart  0-6 Units Subcutaneous TID WC   levothyroxine   175 mcg Oral Q0600   metoprolol  tartrate  37.5 mg Oral BID   pregabalin   50-100 mg Oral QHS   rosuvastatin   5 mg Oral Daily   sevelamer  carbonate  2,400 mg Oral TID WC   sodium chloride  flush  3 mL Intravenous Q12H   Continuous Infusions:  amiodarone  Stopped (03/15/24 1400)   PRN Meds: acetaminophen , albuterol , melatonin, ondansetron  (ZOFRAN ) IV, polyethylene glycol  Allergies:   No Known Allergies  Social History:   Social History   Socioeconomic History   Marital status: Widowed    Spouse name: Clotilda   Number of children: 0   Years of education: Not on file   Highest education level: Not on file  Occupational History  Occupation: disability  Tobacco Use   Smoking status: Never   Smokeless tobacco: Never  Vaping Use   Vaping status: Never Used  Substance and Sexual Activity   Alcohol use: No    Comment: last use early 2023   Drug use: No   Sexual activity: Yes  Other Topics Concern   Not on file  Social History Narrative   Wife deceased 04-28-20, has 2 step children, independent at baseline.   Social Drivers of Health   Financial Resource Strain: Medium Risk (09/12/2023)   Received from [facility]   Overall Financial Resource Strain (CARDIA)    Difficulty of Paying Living Expenses: Somewhat hard  Food Insecurity: No Food Insecurity (12/24/2023)   Hunger Vital Sign    Worried About Running Out of Food in the Last Year: Never true    Ran Out of Food in the Last Year: Never true  Transportation Needs: No Transportation Needs (12/24/2023)   PRAPARE - Administrator, Civil Service (Medical): No    Lack of Transportation (Non-Medical): No  Physical Activity: Insufficiently Active (06/25/2023)   Exercise Vital Sign    Days of Exercise per Week: 2  days    Minutes of Exercise per Session: 40 min  Stress: No Stress Concern Present (07/09/2023)   Received from [facility] of Occupational Health - Occupational Stress Questionnaire    Feeling of Stress : Not at all  Social Connections: Moderately Isolated (06/25/2023)   Social Connection and Isolation Panel    Frequency of Communication with Friends and Family: More than three times a week    Frequency of Social Gatherings with Friends and Family: Twice a week    Attends Religious Services: More than 4 times per year    Active Member of [employer] or Organizations: No    Attends Banker Meetings: Never    Marital Status: Widowed  Intimate Partner Violence: Not At Risk (12/23/2023)   Humiliation, Afraid, Rape, and Kick questionnaire    Fear of Current or Ex-Partner: No    Emotionally Abused: No    Physically Abused: No    Sexually Abused: No    Family History:    Family History  Problem Relation Age of Onset   Breast cancer Mother    Diabetes Father    Diabetes Sister    Arthritis Maternal Grandmother    Diabetes Maternal Grandmother      ROS:  Please see the history of present illness.   All other ROS reviewed and negative.     Physical Exam/Data: Vitals:   03/15/24 1300 03/15/24 1315 03/15/24 1329 03/15/24 1353  BP: (!) 183/75 (!) 160/67 (!) 175/71 (!) 171/77  Pulse: 79 83 84 79  Resp: 14 (!) 32 (!) 30 (!) 29  Temp:   98.6 F (37 C)   TempSrc:   Oral   SpO2: 92% 94% 95% (!) 89%  Weight:      Height:        Intake/Output Summary (Last 24 hours) at 03/15/2024 1514 Last data filed at 03/15/2024 1220 Gross per 24 hour  Intake 229.18 ml  Output 1900 ml  Net -1670.82 ml      03/15/2024   12:20 PM 03/15/2024    8:41 AM 03/14/2024    8:26 PM  Last 3 Weights  Weight (lbs) --  270 lb 15.1 oz 270 lb 15.1 oz  Weight (kg) --  122.9 kg 122.9 kg     Significant value  Body mass index is 36.75 kg/m.  General:  Well nourished, well  developed. On BiPAP.  HEENT: normal Neck: unable to assess 2/2 body habitus Vascular: No carotid bruits; Distal pulses 2+ bilaterally Cardiac:  normal S1, S2; RRR; no murmur  Lungs:  bibasilar crackles  Abd: generally distended (fluid per patient) Ext: 1-2+ bilateral LE edema Musculoskeletal:  No deformities, BUE and BLE strength normal and equal Skin: warm and dry  Neuro:  CNs 2-12 intact, no focal abnormalities noted Psych:  Normal affect   EKG:  The EKG was personally reviewed and demonstrates: Initial emergency department EKG consistent with 2-1 atrial flutter, ventricular rate in the 150s Telemetry:  Telemetry was personally reviewed and demonstrates:  Admission telemetry shows a mix of sinus rhythm and bursts of SVT. Isolated NSVT also seen.  Relevant CV Studies:  12/25/23 TTE  IMPRESSIONS   1. Left ventricular ejection fraction, by estimation, is 60 to 65%. The  left ventricle has normal function. The left ventricle has no regional  wall motion abnormalities. There is mild left ventricular hypertrophy.  Left ventricular diastolic parameters  were normal.   2. Right ventricular systolic function is normal. The right ventricular  size is normal.   3. The mitral valve is normal in structure. Mild mitral valve  regurgitation. No evidence of mitral stenosis.   4. The aortic valve is tricuspid. There is mild calcification of the  aortic valve. Aortic valve regurgitation is not visualized. Aortic valve  sclerosis is present, with no evidence of aortic valve stenosis.   5. The inferior vena cava is normal in size with greater than 50%  respiratory variability, suggesting right atrial pressure of 3 mmHg.   FINDINGS   Left Ventricle: Left ventricular ejection fraction, by estimation, is 60  to 65%. The left ventricle has normal function. The left ventricle has no  regional wall motion abnormalities. Strain was performed and the global  longitudinal strain is  indeterminate. The  left ventricular internal cavity size was normal in  size. There is mild left ventricular hypertrophy. Left ventricular  diastolic parameters were normal.   Right Ventricle: The right ventricular size is normal. No increase in  right ventricular wall thickness. Right ventricular systolic function is  normal.   Left Atrium: Left atrial size was normal in size.   Right Atrium: Right atrial size was normal in size.   Pericardium: There is no evidence of pericardial effusion.   Mitral Valve: The mitral valve is normal in structure. Mild mitral valve  regurgitation. No evidence of mitral valve stenosis. MV peak gradient, 8.0  mmHg. The mean mitral valve gradient is 3.0 mmHg.   Tricuspid Valve: The tricuspid valve is normal in structure. Tricuspid  valve regurgitation is not demonstrated. No evidence of tricuspid  stenosis.   Aortic Valve: The aortic valve is tricuspid. There is mild calcification  of the aortic valve. Aortic valve regurgitation is not visualized. Aortic  valve sclerosis is present, with no evidence of aortic valve stenosis.  Aortic valve mean gradient measures  4.0 mmHg. Aortic valve peak gradient measures 6.9 mmHg. Aortic valve area,  by VTI measures 2.51 cm.   Pulmonic Valve: The pulmonic valve was normal in structure. Pulmonic valve  regurgitation is not visualized. No evidence of pulmonic stenosis.   Aorta: The aortic root is normal in size and structure.   Venous: The inferior vena cava is normal in size with greater than 50%  respiratory variability, suggesting right atrial pressure of 3 mmHg.  IAS/Shunts: No atrial level shunt detected by color flow Doppler.   Additional Comments: 3D was performed not requiring image post processing  on an independent workstation and was indeterminate.   Laboratory Data: High Sensitivity Troponin:   Recent Labs  Lab 03/14/24 2029 03/14/24 2235  TROPONINIHS 64* 381*     Chemistry Recent Labs  Lab 03/14/24 2029  03/14/24 2210 03/15/24 0529  NA 142 141 142  K 4.7 4.6 4.9  CL 106  --  107  CO2 12*  --  12*  GLUCOSE 147*  --  160*  BUN 154*  --  158*  CREATININE 14.73*  --  14.62*  CALCIUM  7.0*  --  7.0*  MG 2.6*  --  2.6*  GFRNONAA 4*  --  4*  ANIONGAP 24*  --  23*    No results for input(s): PROT, ALBUMIN , AST, ALT, ALKPHOS, BILITOT in the last 168 hours. Lipids No results for input(s): CHOL, TRIG, HDL, LABVLDL, LDLCALC, CHOLHDL in the last 168 hours.  Hematology Recent Labs  Lab 03/14/24 2029 03/14/24 2210 03/15/24 0529  WBC 10.6*  --  10.8*  RBC 3.24*  --  3.03*  HGB 10.2* 8.5* 9.6*  HCT 30.7* 25.0* 29.1*  MCV 94.8  --  96.0  MCH 31.5  --  31.7  MCHC 33.2  --  33.0  RDW 13.2  --  13.4  PLT 160  --  151   Thyroid   Recent Labs  Lab 03/15/24 0529  TSH 6.757*    BNPNo results for input(s): BNP, PROBNP in the last 168 hours.  DDimer No results for input(s): DDIMER in the last 168 hours.  Radiology/Studies:  DG CHEST PORT 1 VIEW Result Date: 03/15/2024 CLINICAL DATA:  Difficulty breathing, low O2 sats, tachycardia. EXAM: PORTABLE CHEST 1 VIEW COMPARISON:  03/14/2024 and CT chest 12/24/2023. FINDINGS: Trachea is midline. Heart is enlarged. Diffuse mixed interstitial and airspace opacification appears progressive from 03/14/2024. Tiny bilateral pleural effusions. IMPRESSION: Coarsening congestive heart failure. Electronically Signed   By: Newell Eke M.D.   On: 03/15/2024 15:02   DG Chest Portable 1 View Result Date: 03/14/2024 EXAM: 1 VIEW XRAY OF THE CHEST 03/14/2024 09:23:00 PM COMPARISON: 12/23/2023 CLINICAL HISTORY: Chest pain, dyspnea. The patient has missed dialysis for one week, is complaining of being unable to breathe, has a fistula in the left arm, low O2 sats, rapid heart rate of 150, and nasal O2 placed. FINDINGS: LUNGS AND PLEURA: No focal pulmonary opacity. No pulmonary edema. No pleural effusion. No pneumothorax. Bibasilar atelectasis.  HEART AND MEDIASTINUM: Mild cardiomegaly, unchanged. BONES AND SOFT TISSUES: No acute osseous abnormality. IMPRESSION: 1. Mild cardiomegaly, unchanged. 2. Bibasilar atelectasis. Electronically signed by: Franky Stanford MD 03/14/2024 09:32 PM EDT RP Workstation: [id number]     Assessment and Plan:  Atrial flutter with rapid ventricular response Patient admitted with acute hypoxic respiratory failure in the setting of missed dialysis x 10 days noted to have tachyarrhythmia on admission and consistent with possible 2-1 atrial flutter vs SVT.  He was started on an amiodarone  infusion (appears to have not been taking oral amiodarone  in outpatient setting as prescribed), and rates did appear to rapidly respond. -Telemetry now shows sinus rhythm. Telemetry from earlier this admission with 10-15 beat runs of SVT, isolated and brief NSVT.  Suspect primarily triggered by respiratory failure. - IV amiodarone  converted to oral regimen.  Agree with 200 mg twice daily.   - Continue Cardizem  CD1 120 mg daily, metoprolol  tartrate 37.5 mg twice daily -  Continue Eliquis  5 mg twice daily  Elevated troponin Patient admitted with hypoxic respiratory failure necessitating BiPAP noted to have elevated troponin, 64->381.  A third troponin has been ordered though not yet drawn.  Patient denies chest pain. - Troponin is consistent with type II NSTEMI, demand ischemia.  Hypervolemia Patient admitted with respiratory failure after missing dialysis for over a 10-day period.  Completed inpatient dialysis this morning with 1.9 L removed per chart.  Continues to have evidence of significant hypervolemia. IV Lasix  and HD per nephrology service  Risk Assessment/Risk Scores: CHA2DS2-VASc Score = 4   This indicates a 4.8% annual risk of stroke. The patient's score is based upon: CHF History: 0 HTN History: 1 Diabetes History: 1 Stroke History: 2 Vascular Disease History: 0 Age Score: 0 Gender Score: 0   For questions or  updates, please contact [facility] Please consult [email or web address] for contact info under  Signed, Artist Pouch, PA-C  03/15/2024 3:14 PM  Patient seen and examined, note reviewed with the signed Advanced Practice Provider. I personally reviewed laboratory data, imaging studies and relevant notes. I independently examined the patient and formulated the important aspects of the plan. I have personally discussed the plan with the patient and/or family. Comments or changes to the note/plan are indicated below.  Paroxysmal atrial flutter/ ventricular response  Elevated troponin  Hypervolemia   Thankfully he has started his hemodialysis and I am hoping that this will help with his significant hypervolemia.  He also is responding to be Lasix  IV but ideally he does need the dialysis to help with this volume. His respiratory failure he is on BiPAP this is driven by his volume overload.  In terms of his atrial fibrillation he has converted to sinus rhythm and is now on Cardizem  120 mg daily, metoprolol  tartrate 37.5 mg twice a day and amiodarone  200 mg twice daily.  Troponin elevated likely mismatch but will follow.   Will continue to follow with you  Chenel Wernli DO, MS [facility] Attending Cardiologist [facility]  [address] [place], [place] [address] [phone] Website: [email or web address]

## 2024-03-15 NOTE — Consult Note (Signed)
 Renal Service Consult Note [facility]  Gregory Crane 03/15/2024 Gregory [name], MD Requesting Physician: Gregory. Rosario  Reason for Consult: ESRD patient with respiratory distress HPI: The patient is a 53 y.o. year-old w/ PMH as below who presented to ED last night saying he was short of breath and had diminished dialysis for 1 week.  In ED BP 169/107, HR 75, RR 17-24, afebrile.  Patient was placed on BiPAP early in the morning and remains on BiPAP this morning.  Chest x-ray show looks like early pulmonary edema and vascular congestion.  EKG showed atrial flutter and diffuse ST depression.  Amiodarone  drip was started.  Patient was admitted.  Nephrology was consulted for dialysis.   Pt seen in the HD unit.  BiPAP mask is on and conversation is difficult.  Patient is having some cramping and we turned off the UF for period of time.  Patient has been on dialysis for 2 years.   ROS - denies CP, no joint pain, no HA, no blurry vision, no rash, no diarrhea, no nausea/ vomiting   Past Medical History  Past Medical History:  Diagnosis Date   Allergy    Anemia    Bell's palsy    Diabetes mellitus without complication (HCC)    diet controlled   Fall 07/2023   Hypertension    Hypothyroidism    Kidney stones    Pseudotumor cerebri    Stroke [facility])    Past Surgical History  Past Surgical History:  Procedure Laterality Date   COLONOSCOPY WITH PROPOFOL  N/A 03/30/2020   Procedure: COLONOSCOPY WITH PROPOFOL ;  Surgeon: Gregory Ole DASEN, MD;  Location: [facility] ENDOSCOPY;  Service: Endoscopy;  Laterality: N/A;   LOOP RECORDER INSERTION N/A 01/27/2018   Procedure: LOOP RECORDER INSERTION;  Surgeon: Gregory Elspeth BROCKS, MD;  Location: [facility] INVASIVE CV LAB;  Service: Cardiovascular;  Laterality: N/A;   LUMBAR PUNCTURE     as child   NO PAST SURGERIES     REMOVAL OF A DIALYSIS CATHETER     TEE WITHOUT CARDIOVERSION N/A 01/07/2018   Procedure: TRANSESOPHAGEAL ECHOCARDIOGRAM (TEE);   Surgeon: Gregory Evalene PARAS, MD;  Location: [facility] ORS;  Service: Cardiovascular;  Laterality: N/A;   Family History  Family History  Problem Relation Age of Onset   Breast cancer Mother    Diabetes Father    Diabetes Sister    Arthritis Maternal Grandmother    Diabetes Maternal Grandmother    Social History  reports that he has never smoked. He has never used smokeless tobacco. He reports that he does not drink alcohol and does not use drugs. Allergies No Known Allergies Home medications Prior to Admission medications   Medication Sig Start Date End Date Taking? Authorizing Provider  apixaban  (ELIQUIS ) 5 MG TABS tablet Take 1 tablet (5 mg total) by mouth 2 (two) times daily. 07/03/23  Yes Gregory City, MD  calcium  carbonate (TUMS - DOSED IN MG ELEMENTAL CALCIUM ) 500 MG chewable tablet Chew 2 tablets by mouth 3 (three) times daily with meals.   Yes [provider]  dorzolamide -timolol  (COSOPT ) 2-0.5 % ophthalmic solution Place 1 drop into both eyes 2 (two) times daily. 11/26/23  Yes [provider]  furosemide  (LASIX ) 40 MG tablet Take 1 tablet (40 mg total) by mouth 2 (two) times daily. 07/03/23  Yes Gregory City, MD  hydrALAZINE  (APRESOLINE ) 25 MG tablet Take 25 mg by mouth 2 (two) times daily. 11/05/23  Yes [provider]  levothyroxine  (SYNTHROID ) 175 MCG tablet  TAKE 1 TABLET (175 MCG) BY MOUTH ONCE DAILY BEFORE BREAKFAST. 10/09/23  Yes Gregory Shad, MD  metoprolol  tartrate 37.5 MG TABS Take 1 tablet (37.5 mg total) by mouth 2 (two) times daily. 12/26/23  Yes Gregory Qualia, MD  pregabalin  (LYRICA ) 50 MG capsule 1 -2 tablets q hs Patient taking differently: Take 100 mg by mouth at bedtime. 01/30/24  Yes Gregory Shad, MD  amiodarone  (PACERONE ) 200 MG tablet Take 1 tablet (200 mg total) by mouth 2 (two) times daily. Patient not taking: Reported on 03/15/2024 12/26/23   Gregory Qualia, MD  diltiazem  (CARDIZEM  CD) 120 MG 24 hr capsule Take 1 capsule (120 mg total) by mouth daily.  12/26/23   Amin, Sumayya, MD  RENVELA  800 MG tablet Take 800 mg by mouth 3 (three) times daily. Patient not taking: Reported on 03/15/2024 04/23/22   [provider]  rosuvastatin  (CRESTOR ) 5 MG tablet Take 5 mg by mouth daily. Patient not taking: Reported on 03/15/2024    [provider]     Vitals:   03/15/24 9145 03/15/24 0911 03/15/24 0926 03/15/24 0941  BP: (!) 148/81 (!) 158/88 (!) 143/77 (!) 165/79  Pulse: 71 83 68 69  Resp: (!) 23 15 14 14   Temp:      TempSrc:      SpO2: 100% 95% 98% 98%  Weight:      Height:       Exam Gen alert, on BiPAP No rash, cyanosis or gangrene Sclera anicteric, throat clear  No jvd or bruits Chest clear bilat to bases, no rales/ wheezing RRR no MRG Abd soft ntnd no mass or ascites +bs GU deferred MS no joint effusions or deformity Ext 1+ pretibial LE edema, no other edema Neuro is alert, Ox 3 , nf    LUA aVF + bruit    OP HD: MWF [facility], [place] Gregory Crane  CXR: looks like vasc congestion, possible early IS edema  Assessment/ Plan: Respiratory distress: Shortness of breath after missing 1 week of dialysis.  BiPAP placed and patient is stable and was transported up for dialysis this morning.  He is cramping on the machine already which is unusual but we will continue to try and get fluid off. ESRD: on HD MWF in [place].  HD as above HTN: Resume blood pressure lowering meds after dialysis Volume: Mild lower extremity edema, vascular congestion possible interstitial edema on x-ray. Anemia of esrd: Hgb 9-11.,  Follow-up       Gregory Fret  MD [facility] 03/15/2024, 10:29 AM  Recent Labs  Lab 03/14/24 2029 03/14/24 2210 03/15/24 0529  HGB 10.2* 8.5* 9.6*  CALCIUM  7.0*  --  7.0*  PHOS  --   --  >30.0*  CREATININE 14.73*  --  14.62*  K 4.7 4.6 4.9   Inpatient medications:  amiodarone   200 mg Oral BID   apixaban   5 mg Oral BID   Chlorhexidine  Gluconate Cloth  6 each Topical Q0600   diltiazem   120 mg Oral  Daily   dorzolamide -timolol   1 drop Both Eyes BID   furosemide   40 mg Intravenous BID   hydrALAZINE   25 mg Oral BID   insulin  aspart  0-6 Units Subcutaneous TID WC   levothyroxine   175 mcg Oral Q0600   metoprolol  tartrate  37.5 mg Oral BID   pregabalin   50-100 mg Oral QHS   rosuvastatin   5 mg Oral Daily   sodium chloride  flush  3 mL Intravenous Q12H    amiodarone  30 mg/hr (  03/15/24 0659)   acetaminophen , albuterol , melatonin, ondansetron  (ZOFRAN ) IV, polyethylene glycol

## 2024-03-15 NOTE — ED Notes (Signed)
 Pt transported to dialysis

## 2024-03-15 NOTE — ED Notes (Signed)
 Called CCMD.

## 2024-03-15 NOTE — Progress Notes (Signed)
 Pt transported via Bipap to another room. No complications noted.

## 2024-03-15 NOTE — Progress Notes (Signed)
 PHARMACY - ANTICOAGULATION CONSULT NOTE  Pharmacy Consult for Heparin  Indication: chest pain/ACS  No Known Allergies  Patient Measurements: Height: 6' (182.9 cm) Weight: (S)  (bed has not weight on it not able to get weight) IBW/kg (Calculated) : 77.6 HEPARIN  DW (KG): 104.8  Vital Signs: Temp: 98.6 F (37 C) (07/21 1329) Temp Source: Oral (07/21 1329) BP: 153/66 (07/21 1700) Pulse Rate: 68 (07/21 1700)  Labs: Recent Labs    03/14/24 2029 03/14/24 2210 03/14/24 2235 03/15/24 0529 03/15/24 1519 03/15/24 1658  HGB 10.2*   < >  --  9.6* 10.5* 9.0*  HCT 30.7*   < >  --  29.1* 31.0* 26.2*  PLT 160  --   --  151  --  139*  CREATININE 14.73*  --   --  14.62*  --  10.01*  TROPONINIHS 64*  --  381*  --   --  6,025*  4,302*   < > = values in this interval not displayed.    Estimated Creatinine Clearance: 11.6 mL/min (A) (by C-G formula based on SCr of 10.01 mg/dL (H)).   Medical History: Past Medical History:  Diagnosis Date   Allergy    Anemia    Bell's palsy    Diabetes mellitus without complication (HCC)    diet controlled   Fall 07/2023   Hypertension    Hypothyroidism    Kidney stones    Pseudotumor cerebri    Stroke [facility])     Medications:  Scheduled:   amiodarone   200 mg Oral BID   Chlorhexidine  Gluconate Cloth  6 each Topical Q0600   diltiazem   120 mg Oral Daily   dorzolamide -timolol   1 drop Both Eyes BID   furosemide   40 mg Intravenous BID   hydrALAZINE   25 mg Oral BID   insulin  aspart  0-6 Units Subcutaneous TID WC   levothyroxine   175 mcg Oral Q0600   metoprolol  tartrate  37.5 mg Oral BID   pregabalin   50-100 mg Oral QHS   rosuvastatin   5 mg Oral Daily   sodium chloride  flush  3 mL Intravenous Q12H   Infusions:   amiodarone  Stopped (03/15/24 1400)   PRN: acetaminophen , albuterol , melatonin, ondansetron  (ZOFRAN ) IV, polyethylene glycol  Assessment: 53 yo male on chronic Eliquis  for afib. Pharmacy consulted to dose IV heparin  while Eliquis  on  hold for cardiac workup for elevated troponin.  Goal of Therapy:  Heparin  level 0.3-0.7 units/ml Monitor platelets by anticoagulation protocol: Yes   Plan:  Start IV heparin  infusion 1400 units/hr (no bolus since received Eliquis  ~13:00) Check aPTT and HL in 8hrs and daily - once levels correlate, can monitor with HL only Continue to monitor H&H and platelets F/u Cards plans  Rocky Slade, PharmD, BCPS 03/15/2024,8:07 PM  Please check AMION for all Day [facility] Pharmacy phone numbers After 10:00 PM, call Main Pharmacy [phone]

## 2024-03-15 NOTE — Progress Notes (Signed)
 Pt was on Gregory Crane 6 with SpO2 of 90%. B/L crackles heard. Placed back on BIPAP 6/6 @ 60%. SpO2 100% on BIPAP. Tol well.

## 2024-03-15 NOTE — ED Notes (Signed)
 RT in room. Pt is being placed back on bipap.

## 2024-03-15 NOTE — Procedures (Signed)
 I was present at the procedure, reviewed the HD regimen and made appropriate changes.   [name] MD  [facility] 03/15/2024, 11:31 AM

## 2024-03-16 ENCOUNTER — Inpatient Hospital Stay (HOSPITAL_COMMUNITY)

## 2024-03-16 ENCOUNTER — Encounter (HOSPITAL_COMMUNITY): Payer: Self-pay | Admitting: Internal Medicine

## 2024-03-16 DIAGNOSIS — I480 Paroxysmal atrial fibrillation: Secondary | ICD-10-CM | POA: Diagnosis not present

## 2024-03-16 DIAGNOSIS — I3139 Other pericardial effusion (noninflammatory): Secondary | ICD-10-CM

## 2024-03-16 DIAGNOSIS — I214 Non-ST elevation (NSTEMI) myocardial infarction: Secondary | ICD-10-CM | POA: Diagnosis not present

## 2024-03-16 DIAGNOSIS — I10 Essential (primary) hypertension: Secondary | ICD-10-CM | POA: Diagnosis not present

## 2024-03-16 DIAGNOSIS — E785 Hyperlipidemia, unspecified: Secondary | ICD-10-CM | POA: Diagnosis not present

## 2024-03-16 DIAGNOSIS — E8779 Other fluid overload: Secondary | ICD-10-CM | POA: Diagnosis not present

## 2024-03-16 LAB — CBC WITH DIFFERENTIAL/PLATELET
Abs Immature Granulocytes: 0.03 K/uL (ref 0.00–0.07)
Basophils Absolute: 0 K/uL (ref 0.0–0.1)
Basophils Relative: 0 %
Eosinophils Absolute: 0 K/uL (ref 0.0–0.5)
Eosinophils Relative: 1 %
HCT: 23.6 % — ABNORMAL LOW (ref 39.0–52.0)
Hemoglobin: 8 g/dL — ABNORMAL LOW (ref 13.0–17.0)
Immature Granulocytes: 1 %
Lymphocytes Relative: 12 %
Lymphs Abs: 0.8 K/uL (ref 0.7–4.0)
MCH: 31.7 pg (ref 26.0–34.0)
MCHC: 33.9 g/dL (ref 30.0–36.0)
MCV: 93.7 fL (ref 80.0–100.0)
Monocytes Absolute: 0.5 K/uL (ref 0.1–1.0)
Monocytes Relative: 8 %
Neutro Abs: 4.8 K/uL (ref 1.7–7.7)
Neutrophils Relative %: 78 %
Platelets: 145 K/uL — ABNORMAL LOW (ref 150–400)
RBC: 2.52 MIL/uL — ABNORMAL LOW (ref 4.22–5.81)
RDW: 13.3 % (ref 11.5–15.5)
WBC: 6.1 K/uL (ref 4.0–10.5)
nRBC: 0 % (ref 0.0–0.2)

## 2024-03-16 LAB — GLUCOSE, CAPILLARY
Glucose-Capillary: 89 mg/dL (ref 70–99)
Glucose-Capillary: 73 mg/dL (ref 70–99)
Glucose-Capillary: 83 mg/dL (ref 70–99)

## 2024-03-16 LAB — HEPARIN LEVEL (UNFRACTIONATED)
Heparin Unfractionated: >1.1 [IU]/mL — ABNORMAL HIGH (ref 0.30–0.70)
Heparin Unfractionated: >1.1 [IU]/mL — ABNORMAL HIGH (ref 0.30–0.70)

## 2024-03-16 LAB — RENAL FUNCTION PANEL
Albumin: 2.8 g/dL — ABNORMAL LOW (ref 3.5–5.0)
Anion gap: 19 — ABNORMAL HIGH (ref 5–15)
BUN: 106 mg/dL — ABNORMAL HIGH (ref 6–20)
CO2: 19 mmol/L — ABNORMAL LOW (ref 22–32)
Calcium: 7.2 mg/dL — ABNORMAL LOW (ref 8.9–10.3)
Chloride: 102 mmol/L (ref 98–111)
Creatinine, Ser: 11.07 mg/dL — ABNORMAL HIGH (ref 0.61–1.24)
GFR, Estimated: 5 mL/min — ABNORMAL LOW (ref >60)
Glucose, Bld: 94 mg/dL (ref 70–99)
Phosphorus: 10.3 mg/dL — ABNORMAL HIGH (ref 2.5–4.6)
Potassium: 3.8 mmol/L (ref 3.5–5.1)
Sodium: 140 mmol/L (ref 135–145)

## 2024-03-16 LAB — TROPONIN I (HIGH SENSITIVITY): Troponin I (High Sensitivity): 5383 ng/L (ref <18)

## 2024-03-16 LAB — MAGNESIUM: Magnesium: 2.2 mg/dL (ref 1.7–2.4)

## 2024-03-16 LAB — APTT
aPTT: 54 s — ABNORMAL HIGH (ref 24–36)
aPTT: 66 s — ABNORMAL HIGH (ref 24–36)

## 2024-03-16 LAB — ECHOCARDIOGRAM COMPLETE
AR max vel: 2.73 cm2
AV Area VTI: 2.55 cm2
AV Area mean vel: 2.62 cm2
AV Mean grad: 5 mmHg
AV Peak grad: 10.2 mmHg
Ao pk vel: 1.6 m/s
Area-P 1/2: 3.54 cm2
Height: 72 in
S' Lateral: 3.6 cm
Weight: 4165.81 [oz_av]

## 2024-03-16 LAB — FOLATE: Folate: 18.6 ng/mL (ref >5.9)

## 2024-03-16 LAB — VITAMIN B12: Vitamin B-12: 113 pg/mL — ABNORMAL LOW (ref 180–914)

## 2024-03-16 LAB — HEPATITIS B SURFACE ANTIBODY, QUANTITATIVE: Hep B S AB Quant (Post): <3.5 m[IU]/mL — ABNORMAL LOW

## 2024-03-16 MED ORDER — VITAMIN B-12 1000 MCG PO TABS: 1000.0000 ug | ORAL_TABLET | Freq: Every day | ORAL | Status: DC

## 2024-03-16 MED ORDER — CYANOCOBALAMIN 1000 MCG/ML IJ SOLN
1000.0000 ug | Freq: Every day | INTRAMUSCULAR | Status: AC
  Administered 2024-03-16 – 2024-03-18 (×3): 1000 ug via SUBCUTANEOUS
  Filled 2024-03-16 (×3): qty 1

## 2024-03-16 MED ORDER — PENTAFLUOROPROP-TETRAFLUOROETH EX AERO
INHALATION_SPRAY | CUTANEOUS | Status: AC
  Filled 2024-03-16: qty 30

## 2024-03-16 MED ORDER — ASPIRIN 81 MG PO TBEC
81.0000 mg | DELAYED_RELEASE_TABLET | Freq: Every day | ORAL | Status: DC
  Administered 2024-03-17 – 2024-03-18 (×2): 81 mg via ORAL
  Filled 2024-03-16: qty 1

## 2024-03-16 MED ORDER — ATORVASTATIN CALCIUM 40 MG PO TABS
40.0000 mg | ORAL_TABLET | Freq: Every day | ORAL | Status: DC
  Administered 2024-03-17 – 2024-03-18 (×2): 40 mg via ORAL
  Filled 2024-03-16 (×2): qty 1

## 2024-03-16 MED ORDER — LIDOCAINE-PRILOCAINE 2.5-2.5 % EX CREA: 1.0000 | TOPICAL_CREAM | CUTANEOUS | Status: DC | PRN

## 2024-03-16 MED ORDER — METOPROLOL TARTRATE 25 MG PO TABS
25.0000 mg | ORAL_TABLET | Freq: Two times a day (BID) | ORAL | Status: DC
  Administered 2024-03-16 – 2024-03-18 (×3): 25 mg via ORAL
  Filled 2024-03-16 (×2): qty 1

## 2024-03-16 MED ORDER — CHLORHEXIDINE GLUCONATE CLOTH 2 % EX PADS
6.0000 | MEDICATED_PAD | Freq: Every day | CUTANEOUS | Status: DC
  Administered 2024-03-16 – 2024-03-17 (×2): 6 via TOPICAL

## 2024-03-16 MED ORDER — HEPARIN BOLUS VIA INFUSION
3000.0000 [IU] | Freq: Once | INTRAVENOUS | Status: AC
  Administered 2024-03-16: 3000 [IU] via INTRAVENOUS
  Filled 2024-03-16: qty 3000

## 2024-03-16 MED ORDER — TECHNETIUM TO 99M ALBUMIN AGGREGATED
4.0000 | Freq: Once | INTRAVENOUS | Status: AC
  Administered 2024-03-16: 4 via INTRAVENOUS

## 2024-03-16 MED ORDER — PENTAFLUOROPROP-TETRAFLUOROETH EX AERO: 1.0000 | INHALATION_SPRAY | CUTANEOUS | Status: DC | PRN

## 2024-03-16 MED ORDER — ALTEPLASE 2 MG IJ SOLR: 2.0000 mg | Freq: Once | INTRAMUSCULAR | Status: DC | PRN

## 2024-03-16 MED ORDER — ASPIRIN 325 MG PO TABS
325.0000 mg | ORAL_TABLET | Freq: Once | ORAL | Status: DC
Start: 2024-03-16 — End: 2024-03-17

## 2024-03-16 MED ORDER — HEPARIN SODIUM (PORCINE) 1000 UNIT/ML DIALYSIS: 1000.0000 [IU] | INTRAMUSCULAR | Status: DC | PRN

## 2024-03-16 MED ORDER — LIDOCAINE HCL (PF) 1 % IJ SOLN: 5.0000 mL | INTRAMUSCULAR | Status: DC | PRN

## 2024-03-16 NOTE — Progress Notes (Signed)
   03/16/24 1506  Vitals  Temp 98.2 F (36.8 C)  BP (!) 132/55  Pulse Rate (!) 54  Resp 16  Weight 114.2 kg  Type of Weight Post-Dialysis  Oxygen Therapy  SpO2 99 %  O2 Device Nasal Cannula  O2 Flow Rate (L/min) 2 L/min  Pulse Oximetry Type Continuous  Oximetry Probe Site Changed No  During Treatment Monitoring  Duration of HD Treatment -hour(s) 3.25 hour(s)  HD Safety Checks Performed Yes  Intra-Hemodialysis Comments Tx completed  Dialysis Fluid Bolus Normal Saline  Bolus Amount (mL) 300 mL  Post Treatment  Dialyzer Clearance Clear  Liters Processed 77.9  Fluid Removed (mL) 2500 mL  Tolerated HD Treatment Yes  AVG/AVF Arterial Site Held (minutes) 10 minutes  AVG/AVF Venous Site Held (minutes) 10 minutes  Fistula / Graft Left Upper arm Arteriovenous fistula  No placement date or time found.   Orientation: Left  Access Location: Upper arm  Access Type: Arteriovenous fistula  Status Deaccessed

## 2024-03-16 NOTE — Progress Notes (Addendum)
 Rounding Note   Patient Name: Gregory Crane Date of Encounter: 03/16/2024  [facility] Cardiologist: Lonni LITTIE Nanas, MD   Subjective No acute overnight events. His shortness of breath has greatly improved. He is no longer on BiPAP and is on 4L of O2 via nasal cannula. He denies any chest pain.  Scheduled Meds:  amiodarone   200 mg Oral BID   Chlorhexidine  Gluconate Cloth  6 each Topical Q0600   cyanocobalamin   1,000 mcg Subcutaneous Daily   [START ON 03/19/2024] vitamin B-12  1,000 mcg Oral Daily   dorzolamide -timolol   1 drop Both Eyes BID   insulin  aspart  0-6 Units Subcutaneous TID WC   levothyroxine   175 mcg Oral Q0600   metoprolol  tartrate  25 mg Oral BID   pregabalin   50-100 mg Oral QHS   rosuvastatin   5 mg Oral Daily   sodium chloride  flush  3 mL Intravenous Q12H   Continuous Infusions:  amiodarone  Stopped (03/15/24 1400)   heparin  1,750 Units/hr (03/16/24 0910)   PRN Meds: acetaminophen , albuterol , melatonin, ondansetron  (ZOFRAN ) IV, polyethylene glycol   Vital Signs  Vitals:   03/16/24 0422 03/16/24 0752 03/16/24 0820 03/16/24 0915  BP:  (!) 112/52  (!) 112/52  Pulse: (!) 52 (!) 48 (!) 47 (!) 49  Resp: 16 14 12    Temp:  97.7 F (36.5 C)    TempSrc:  Axillary    SpO2: 99% 99% 100%   Weight: 118.1 kg     Height:        Intake/Output Summary (Last 24 hours) at 03/16/2024 1007 Last data filed at 03/16/2024 0542 Gross per 24 hour  Intake 205.77 ml  Output 2100 ml  Net -1894.23 ml      03/16/2024    4:22 AM 03/15/2024   12:20 PM 03/15/2024    8:41 AM  Last 3 Weights  Weight (lbs) 260 lb 5.8 oz --  270 lb 15.1 oz  Weight (kg) 118.1 kg --  122.9 kg     Significant value      Telemetry Sinus rhythm with rates in the high 40s to low 50s. - Personally Reviewed  ECG  No new ECG tracing today. - Personally Reviewed  Physical Exam  GEN: Obese Caucasian male resting comfortably in no acute distress.   Neck: JVD difficult to assess due to  body habitus. Cardiac: Bradycardic with normal rhythm. No murmurs, rubs, or gallops.  Respiratory: On 4L of O2 via nasal cannula. No increased work of breathing. Mild crackles in left base.  MS: No lower extremity edema. Neuro:  No focal deficits. Psych: Normal affect. Responds appropriately.  Labs High Sensitivity Troponin:   Recent Labs  Lab 03/14/24 2029 03/14/24 2235 03/15/24 1658 03/15/24 2050 03/15/24 2316  TROPONINIHS 64* 381* 6,025*  4,302* 7,307* 5,383*     Chemistry Recent Labs  Lab 03/14/24 2029 03/14/24 2210 03/15/24 0529 03/15/24 1519 03/15/24 1658 03/16/24 0520  NA 142   < > 142 139 139 140  K 4.7   < > 4.9 3.6 3.8 3.8  CL 106  --  107  --  101 102  CO2 12*  --  12*  --  18* 19*  GLUCOSE 147*  --  160*  --  113* 94  BUN 154*  --  158*  --  93* 106*  CREATININE 14.73*  --  14.62*  --  10.01* 11.07*  CALCIUM  7.0*  --  7.0*  --  7.5* 7.2*  MG 2.6*  --  2.6*  --   --  2.2  PROT  --   --   --   --  6.8  --   ALBUMIN   --   --   --   --  3.3* 2.8*  AST  --   --   --   --  21  --   ALT  --   --   --   --  12  --   ALKPHOS  --   --   --   --  59  --   BILITOT  --   --   --   --  1.1  --   GFRNONAA 4*  --  4*  --  6* 5*  ANIONGAP 24*  --  23*  --  20* 19*   < > = values in this interval not displayed.    Lipids No results for input(s): CHOL, TRIG, HDL, LABVLDL, LDLCALC, CHOLHDL in the last 168 hours.  Hematology Recent Labs  Lab 03/15/24 0529 03/15/24 1519 03/15/24 1658 03/16/24 0520  WBC 10.8*  --  10.6* 6.1  RBC 3.03*  --  2.82* 2.52*  HGB 9.6* 10.5* 9.0* 8.0*  HCT 29.1* 31.0* 26.2* 23.6*  MCV 96.0  --  92.9 93.7  MCH 31.7  --  31.9 31.7  MCHC 33.0  --  34.4 33.9  RDW 13.4  --  13.1 13.3  PLT 151  --  139* 145*   Thyroid   Recent Labs  Lab 03/15/24 0529 03/15/24 1658  TSH 6.757*  --   FREET4  --  1.10    BNPNo results for input(s): BNP, PROBNP in the last 168 hours.  DDimer  Recent Labs  Lab 03/15/24 1658  DDIMER 1.25*     Cardiac Studies  Echocardiogram 03/16/2024: Impressions;  1. Left ventricular ejection fraction, by estimation, is 60 to 65%. The  left ventricle has normal function. The left ventricle has no regional  wall motion abnormalities. There is moderate concentric left ventricular  hypertrophy. Left ventricular  diastolic parameters are consistent with Grade I diastolic dysfunction  (impaired relaxation).   2. Right ventricular systolic function is normal. The right ventricular  size is moderately enlarged.   3. The mitral valve is normal in structure. No evidence of mitral valve  regurgitation. No evidence of mitral stenosis.   4. The aortic valve is tricuspid. There is mild calcification of the  aortic valve. Aortic valve regurgitation is not visualized. Aortic valve  sclerosis is present, with no evidence of aortic valve stenosis.   5. The inferior vena cava is normal in size with <50% respiratory  variability, suggesting right atrial pressure of 8 mmHg.   Comparison(s): No significant change from prior study. Prior images  reviewed side by side.    Patient Profile   53 y.o. male with a history of paroxysmal atrial fibrillation on Eliquis , hypertension, hyperlipidemia, type 2 diabetes mellitus, ESRD, CVA, and cirrhosis who was admitted on 03/14/2024 with acute hypoxic respiratory failure requiring BiPAP and volume overload in setting of missing dialysis for 10 days. He was noted to be in atrial flutter with RVR on admission. Cardiology consulted for atrial flutter and elevated troponin.  Assessment & Plan   Atrial Flutter with RVR Paroxysmal Atrial Fibrillation History of paroxysmal atrial fibrillation but patient admitted with atrial flutter with RVR. This is in the setting of acute hypoxic respiratory failure after missing 10 days of dialysis. He was started on IV Amiodarone  and converted to sinus  rhythm. - Maintaining sinus rhythm. Rate in the high 40s to low 50s. - Continue PO  Amiodarone  200mg  twice daily. Not a great long term Amiodarone  candidate given age and cirrhosis but ok to continue for now. - Continue Lopressor  25mg  twice daily. - Diltiazem  was stopped due to low BP interfering with dialysis. - On chronic anticoagulation with Eliquis  at home. This is currently on hold and patient is on IV Heparin  instead given elevated NSTEMI.  NSTEMI Patient was admitted with acute hypoxic respiratory failure in setting of missed dialysis as above and was found to have an elevated troponin. High-sensitivity peaked at 7,307. Echo showed LVEF of 60-65% with normal wall motion, moderate LVH, and grade 1 diastolic dysfunction. - Patient denies any chest pain currently or prior to admission.  - Continue IV Heparin . - Will treat as ACS and give Aspirin  325mg  today and start Aspirin  81mg  daily tomorrow. - Will change Crestor  to Lipitor  40mg  daily given ESRD. - Patient denies any chest pain but troponin trend is consistent with ACS and he has multiple CV risk factors. Recommended cardiac catheterization. Will put him on the board for tomorrow.  The patient understands that risks include but are not limited to stroke (1 in 1000), death (1 in 1000), kidney failure [usually temporary] (1 in 500), bleeding (1 in 200), allergic reaction [possibly serious] (1 in 200), and agrees to proceed.   Hypertension BP has been labile this admission. Systolic BP was a high as the 819d but he reportedly could not undergo dialysis yesterday due to low BP. Most recent BP 112/52. - Continue Lopressor  25mg  twice daily. - Agree with stopping Diltiazem , Hydralazine , and Lasix  for now.  Hyperlipidemia Lipid panel in 09/2023; Total Cholesterol 129, Triglycerides 146, HDL 34, LDL 66.  - Currently on Crestor  5mg  daily. Will stop this and switch to Lipitor  40mg  daily given ESRD.  - Will need repeat lipid panel and LFTs in 6-8 weeks.  ESRD On dialysis Tuesdays/ Thursdays/ Saturdays. However, he missed  several days of dialysis on admission. - Dialysis has been limited by soft BP. Agree with stopping Diltiazem , Hydralazine , and Lasix  given hypotension. - May need Midodrine on dialysis days.  - Management per Nephrology.  Normocytic Anemia Baseline hemoglobin around to 10 to 13 range.  - Hemoglobin as fluctuated this admission and is 8.0 today. - Management per primary team.   For questions or updates, please contact [facility] Please consult [email or web address] for contact info under     Signed, Callie E Goodrich, PA-C  03/16/2024, 10:07 AM    Patient seen and examined, note reviewed with the signed Advanced Practice Provider. I personally reviewed laboratory data, imaging studies and relevant notes. I independently examined the patient and formulated the important aspects of the plan. I have personally discussed the plan with the patient and/or family. Comments or changes to the note/plan are indicated below.  Patient seen and examined at his bedside.   PAF NSTEMI Hypertension  Hyperlipidemia  ESRD  His troponin peaked to 7307, he needs to be evaluated appropriately despite no complaints of chest pain I am concern that he needs an ischemic evaluation. I primus discussed with the patient that the best thing is to proceed with a left heart cath. He is agreeable.   Informed Consent   Shared Decision Making/Informed Consent The risks [stroke (1 in 1000), death (1 in 1000), kidney failure [usually temporary] (1 in 500), bleeding (1 in 200), allergic reaction [possibly serious] (1 in 200)], benefits (diagnostic support  and management of coronary artery disease) and alternatives of a cardiac catheterization were discussed in detail with Mr. Wrinkle and he is willing to proceed.     In the meantime we will continue with medical therapy - heparin  gtt, Aspirin  81 mg daily, lipitor  40 mg daily.   He is planned for HD today   Due to hypotension some blood pressure medications have been  stopped. I agree with this.   Savannah Morford DO, MS [facility] Attending Cardiologist [facility]  [address] [place], [place] [address] [phone] Website: [email or web address]

## 2024-03-16 NOTE — Progress Notes (Signed)
 PHARMACY - ANTICOAGULATION CONSULT NOTE  Pharmacy Consult for Heparin  Indication: chest pain/ACS  No Known Allergies  Patient Measurements: Height: 6' (182.9 cm) Weight: (P) 114.2 kg (251 lb 12.3 oz) IBW/kg (Calculated) : 77.6 HEPARIN  DW (KG): 104.8  Vital Signs: Temp: 97.9 F (36.6 C) (07/22 1626) Temp Source: Oral (07/22 1626) BP: 142/59 (07/22 1626) Pulse Rate: 52 (07/22 1626)  Labs: Recent Labs    03/15/24 0529 03/15/24 1519 03/15/24 1658 03/15/24 2050 03/15/24 2316 03/16/24 0520 03/16/24 1834  HGB 9.6* 10.5* 9.0*  --   --  8.0*  --   HCT 29.1* 31.0* 26.2*  --   --  23.6*  --   PLT 151  --  139*  --   --  145*  --   APTT  --   --   --   --   --  54*  --   HEPARINUNFRC  --   --   --   --   --  >1.10* >1.10*  CREATININE 14.62*  --  10.01*  --   --  11.07*  --   TROPONINIHS  --   --  6,025*  4,302* 7,307* 5,383*  --   --     Estimated Creatinine Clearance: 10.1 mL/min (A) (by C-G formula based on SCr of 11.07 mg/dL (H)).   Medical History: Past Medical History:  Diagnosis Date   Allergy    Anemia    Bell's palsy    Diabetes mellitus without complication (HCC)    diet controlled   Fall 07/2023   Hypertension    Hypothyroidism    Kidney stones    Pseudotumor cerebri    Stroke (HCC)     Medications:  Scheduled:   amiodarone   200 mg Oral BID   [START ON 03/17/2024] aspirin  EC  81 mg Oral Daily   aspirin   325 mg Oral Once   [START ON 03/17/2024] atorvastatin   40 mg Oral Daily   Chlorhexidine  Gluconate Cloth  6 each Topical Q0600   cyanocobalamin   1,000 mcg Subcutaneous Daily   [START ON 03/19/2024] vitamin B-12  1,000 mcg Oral Daily   dorzolamide -timolol   1 drop Both Eyes BID   insulin  aspart  0-6 Units Subcutaneous TID WC   levothyroxine   175 mcg Oral Q0600   metoprolol  tartrate  25 mg Oral BID   pregabalin   50-100 mg Oral QHS   sodium chloride  flush  3 mL Intravenous Q12H   Infusions:   amiodarone  Stopped (03/15/24 1400)   heparin  1,750 Units/hr  (03/16/24 1117)   PRN: acetaminophen , albuterol , melatonin, ondansetron  (ZOFRAN ) IV, polyethylene glycol  Assessment: 53 yo male on chronic Eliquis  for afib. Pharmacy consulted to dose IV heparin  while Eliquis  on hold for cardiac workup for elevated troponin.  7/22 PM: Heparin  level supratherapeutic/ falsely elevated d/t recent DOAC use. APTT  just therapeutic at 66s with heparin  running at 1750 units/hour. Will empirically increase in order to stay in therapeutic range.   Goal of Therapy:  Heparin  level 0.3-0.7 units/ml Monitor platelets by anticoagulation protocol: Yes   Plan:  Increase heparin  infusion to 1,800 units/hour  Check aPTT and HL daily - once levels correlate, can monitor with HL only Continue to monitor H&H and platelets F/U cath plans   Massie Fila, PharmD Clinical Pharmacist  03/16/2024 7:07 PM

## 2024-03-16 NOTE — Progress Notes (Addendum)
 PHARMACY - ANTICOAGULATION CONSULT NOTE  Pharmacy Consult for Heparin  Indication: chest pain/ACS  No Known Allergies  Patient Measurements: Height: 6' (182.9 cm) Weight: 118.1 kg (260 lb 5.8 oz) IBW/kg (Calculated) : 77.6 HEPARIN  DW (KG): 104.8  Vital Signs: Temp: 97.7 F (36.5 C) (07/22 0752) Temp Source: Axillary (07/22 0752) BP: 112/52 (07/22 0752) Pulse Rate: 48 (07/22 0752)  Labs: Recent Labs    03/15/24 0529 03/15/24 1519 03/15/24 1658 03/15/24 2050 03/15/24 2316 03/16/24 0520  HGB 9.6* 10.5* 9.0*  --   --  8.0*  HCT 29.1* 31.0* 26.2*  --   --  23.6*  PLT 151  --  139*  --   --  145*  APTT  --   --   --   --   --  54*  HEPARINUNFRC  --   --   --   --   --  >1.10*  CREATININE 14.62*  --  10.01*  --   --  11.07*  TROPONINIHS  --   --  6,025*  4,302* 7,307* 5,383*  --     Estimated Creatinine Clearance: 10.2 mL/min (A) (by C-G formula based on SCr of 11.07 mg/dL (H)).   Medical History: Past Medical History:  Diagnosis Date   Allergy    Anemia    Bell's palsy    Diabetes mellitus without complication (HCC)    diet controlled   Fall 07/2023   Hypertension    Hypothyroidism    Kidney stones    Pseudotumor cerebri    Stroke [facility])     Medications:  Scheduled:   amiodarone   200 mg Oral BID   Chlorhexidine  Gluconate Cloth  6 each Topical Q0600   cyanocobalamin   1,000 mcg Subcutaneous Daily   [START ON 03/19/2024] vitamin B-12  1,000 mcg Oral Daily   diltiazem   120 mg Oral Daily   dorzolamide -timolol   1 drop Both Eyes BID   furosemide   40 mg Intravenous BID   hydrALAZINE   25 mg Oral BID   insulin  aspart  0-6 Units Subcutaneous TID WC   levothyroxine   175 mcg Oral Q0600   metoprolol  tartrate  37.5 mg Oral BID   pregabalin   50-100 mg Oral QHS   rosuvastatin   5 mg Oral Daily   sodium chloride  flush  3 mL Intravenous Q12H   Infusions:   amiodarone  Stopped (03/15/24 1400)   heparin  1,400 Units/hr (03/16/24 0542)   PRN: acetaminophen , albuterol ,  melatonin, ondansetron  (ZOFRAN ) IV, polyethylene glycol  Assessment: 53 yo male on chronic Eliquis  for afib. Pharmacy consulted to dose IV heparin  while Eliquis  on hold for cardiac workup for elevated troponin.  7/22: aPTT 54 subtherapeutic on 1400u. CBC stable. No signs of bleeding per RN  Goal of Therapy:  Heparin  level 0.3-0.7 units/ml Monitor platelets by anticoagulation protocol: Yes   Plan:  Give 3000units heparin  bolus Increase heparin  infusion by 3-4 u/kg/hr 1750 units/hr  Check aPTT and HL in 8hrs and daily - once levels correlate, can monitor with HL only Continue to monitor H&H and platelets F/u Cards plans  Dionicia Canavan, PharmD, RPh PGY1 Acute Care Pharmacy Resident [facility]  03/16/2024 8:12 AM  I discussed / reviewed the pharmacy note by Dr. Canavan and I agree with the resident's findings and plans as documented.  Thank you Olam Monte, PharmD

## 2024-03-16 NOTE — H&P (View-Only) (Signed)
 Rounding Note   Patient Name: Gregory Crane Date of Encounter: 03/16/2024  [facility] Cardiologist: Lonni LITTIE Nanas, MD   Subjective No acute overnight events. His shortness of breath has greatly improved. He is no longer on BiPAP and is on 4L of O2 via nasal cannula. He denies any chest pain.  Scheduled Meds:  amiodarone   200 mg Oral BID   Chlorhexidine  Gluconate Cloth  6 each Topical Q0600   cyanocobalamin   1,000 mcg Subcutaneous Daily   [START ON 03/19/2024] vitamin B-12  1,000 mcg Oral Daily   dorzolamide -timolol   1 drop Both Eyes BID   insulin  aspart  0-6 Units Subcutaneous TID WC   levothyroxine   175 mcg Oral Q0600   metoprolol  tartrate  25 mg Oral BID   pregabalin   50-100 mg Oral QHS   rosuvastatin   5 mg Oral Daily   sodium chloride  flush  3 mL Intravenous Q12H   Continuous Infusions:  amiodarone  Stopped (03/15/24 1400)   heparin  1,750 Units/hr (03/16/24 0910)   PRN Meds: acetaminophen , albuterol , melatonin, ondansetron  (ZOFRAN ) IV, polyethylene glycol   Vital Signs  Vitals:   03/16/24 0422 03/16/24 0752 03/16/24 0820 03/16/24 0915  BP:  (!) 112/52  (!) 112/52  Pulse: (!) 52 (!) 48 (!) 47 (!) 49  Resp: 16 14 12    Temp:  97.7 F (36.5 C)    TempSrc:  Axillary    SpO2: 99% 99% 100%   Weight: 118.1 kg     Height:        Intake/Output Summary (Last 24 hours) at 03/16/2024 1007 Last data filed at 03/16/2024 0542 Gross per 24 hour  Intake 205.77 ml  Output 2100 ml  Net -1894.23 ml      03/16/2024    4:22 AM 03/15/2024   12:20 PM 03/15/2024    8:41 AM  Last 3 Weights  Weight (lbs) 260 lb 5.8 oz --  270 lb 15.1 oz  Weight (kg) 118.1 kg --  122.9 kg     Significant value      Telemetry Sinus rhythm with rates in the high 40s to low 50s. - Personally Reviewed  ECG  No new ECG tracing today. - Personally Reviewed  Physical Exam  GEN: Obese Caucasian male resting comfortably in no acute distress.   Neck: JVD difficult to assess due to  body habitus. Cardiac: Bradycardic with normal rhythm. No murmurs, rubs, or gallops.  Respiratory: On 4L of O2 via nasal cannula. No increased work of breathing. Mild crackles in left base.  MS: No lower extremity edema. Neuro:  No focal deficits. Psych: Normal affect. Responds appropriately.  Labs High Sensitivity Troponin:   Recent Labs  Lab 03/14/24 2029 03/14/24 2235 03/15/24 1658 03/15/24 2050 03/15/24 2316  TROPONINIHS 64* 381* 6,025*  4,302* 7,307* 5,383*     Chemistry Recent Labs  Lab 03/14/24 2029 03/14/24 2210 03/15/24 0529 03/15/24 1519 03/15/24 1658 03/16/24 0520  NA 142   < > 142 139 139 140  K 4.7   < > 4.9 3.6 3.8 3.8  CL 106  --  107  --  101 102  CO2 12*  --  12*  --  18* 19*  GLUCOSE 147*  --  160*  --  113* 94  BUN 154*  --  158*  --  93* 106*  CREATININE 14.73*  --  14.62*  --  10.01* 11.07*  CALCIUM  7.0*  --  7.0*  --  7.5* 7.2*  MG 2.6*  --  2.6*  --   --  2.2  PROT  --   --   --   --  6.8  --   ALBUMIN   --   --   --   --  3.3* 2.8*  AST  --   --   --   --  21  --   ALT  --   --   --   --  12  --   ALKPHOS  --   --   --   --  59  --   BILITOT  --   --   --   --  1.1  --   GFRNONAA 4*  --  4*  --  6* 5*  ANIONGAP 24*  --  23*  --  20* 19*   < > = values in this interval not displayed.    Lipids No results for input(s): CHOL, TRIG, HDL, LABVLDL, LDLCALC, CHOLHDL in the last 168 hours.  Hematology Recent Labs  Lab 03/15/24 0529 03/15/24 1519 03/15/24 1658 03/16/24 0520  WBC 10.8*  --  10.6* 6.1  RBC 3.03*  --  2.82* 2.52*  HGB 9.6* 10.5* 9.0* 8.0*  HCT 29.1* 31.0* 26.2* 23.6*  MCV 96.0  --  92.9 93.7  MCH 31.7  --  31.9 31.7  MCHC 33.0  --  34.4 33.9  RDW 13.4  --  13.1 13.3  PLT 151  --  139* 145*   Thyroid   Recent Labs  Lab 03/15/24 0529 03/15/24 1658  TSH 6.757*  --   FREET4  --  1.10    BNPNo results for input(s): BNP, PROBNP in the last 168 hours.  DDimer  Recent Labs  Lab 03/15/24 1658  DDIMER 1.25*     Cardiac Studies  Echocardiogram 03/16/2024: Impressions;  1. Left ventricular ejection fraction, by estimation, is 60 to 65%. The  left ventricle has normal function. The left ventricle has no regional  wall motion abnormalities. There is moderate concentric left ventricular  hypertrophy. Left ventricular  diastolic parameters are consistent with Grade I diastolic dysfunction  (impaired relaxation).   2. Right ventricular systolic function is normal. The right ventricular  size is moderately enlarged.   3. The mitral valve is normal in structure. No evidence of mitral valve  regurgitation. No evidence of mitral stenosis.   4. The aortic valve is tricuspid. There is mild calcification of the  aortic valve. Aortic valve regurgitation is not visualized. Aortic valve  sclerosis is present, with no evidence of aortic valve stenosis.   5. The inferior vena cava is normal in size with <50% respiratory  variability, suggesting right atrial pressure of 8 mmHg.   Comparison(s): No significant change from prior study. Prior images  reviewed side by side.    Patient Profile   53 y.o. male with a history of paroxysmal atrial fibrillation on Eliquis , hypertension, hyperlipidemia, type 2 diabetes mellitus, ESRD, CVA, and cirrhosis who was admitted on 03/14/2024 with acute hypoxic respiratory failure requiring BiPAP and volume overload in setting of missing dialysis for 10 days. He was noted to be in atrial flutter with RVR on admission. Cardiology consulted for atrial flutter and elevated troponin.  Assessment & Plan   Atrial Flutter with RVR Paroxysmal Atrial Fibrillation History of paroxysmal atrial fibrillation but patient admitted with atrial flutter with RVR. This is in the setting of acute hypoxic respiratory failure after missing 10 days of dialysis. He was started on IV Amiodarone  and converted to sinus  rhythm. - Maintaining sinus rhythm. Rate in the high 40s to low 50s. - Continue PO  Amiodarone  200mg  twice daily. Not a great long term Amiodarone  candidate given age and cirrhosis but ok to continue for now. - Continue Lopressor  25mg  twice daily. - Diltiazem  was stopped due to low BP interfering with dialysis. - On chronic anticoagulation with Eliquis  at home. This is currently on hold and patient is on IV Heparin  instead given elevated NSTEMI.  NSTEMI Patient was admitted with acute hypoxic respiratory failure in setting of missed dialysis as above and was found to have an elevated troponin. High-sensitivity peaked at 7,307. Echo showed LVEF of 60-65% with normal wall motion, moderate LVH, and grade 1 diastolic dysfunction. - Patient denies any chest pain currently or prior to admission.  - Continue IV Heparin . - Will treat as ACS and give Aspirin  325mg  today and start Aspirin  81mg  daily tomorrow. - Will change Crestor  to Lipitor  40mg  daily given ESRD. - Patient denies any chest pain but troponin trend is consistent with ACS and he has multiple CV risk factors. Recommended cardiac catheterization. Will put him on the board for tomorrow.  The patient understands that risks include but are not limited to stroke (1 in 1000), death (1 in 1000), kidney failure [usually temporary] (1 in 500), bleeding (1 in 200), allergic reaction [possibly serious] (1 in 200), and agrees to proceed.   Hypertension BP has been labile this admission. Systolic BP was a high as the 819d but he reportedly could not undergo dialysis yesterday due to low BP. Most recent BP 112/52. - Continue Lopressor  25mg  twice daily. - Agree with stopping Diltiazem , Hydralazine , and Lasix  for now.  Hyperlipidemia Lipid panel in 09/2023; Total Cholesterol 129, Triglycerides 146, HDL 34, LDL 66.  - Currently on Crestor  5mg  daily. Will stop this and switch to Lipitor  40mg  daily given ESRD.  - Will need repeat lipid panel and LFTs in 6-8 weeks.  ESRD On dialysis Tuesdays/ Thursdays/ Saturdays. However, he missed  several days of dialysis on admission. - Dialysis has been limited by soft BP. Agree with stopping Diltiazem , Hydralazine , and Lasix  given hypotension. - May need Midodrine on dialysis days.  - Management per Nephrology.  Normocytic Anemia Baseline hemoglobin around to 10 to 13 range.  - Hemoglobin as fluctuated this admission and is 8.0 today. - Management per primary team.   For questions or updates, please contact [facility] Please consult [email or web address] for contact info under     Signed, Callie E Goodrich, PA-C  03/16/2024, 10:07 AM    Patient seen and examined, note reviewed with the signed Advanced Practice Provider. I personally reviewed laboratory data, imaging studies and relevant notes. I independently examined the patient and formulated the important aspects of the plan. I have personally discussed the plan with the patient and/or family. Comments or changes to the note/plan are indicated below.  Patient seen and examined at his bedside.   PAF NSTEMI Hypertension  Hyperlipidemia  ESRD  His troponin peaked to 7307, he needs to be evaluated appropriately despite no complaints of chest pain I am concern that he needs an ischemic evaluation. I primus discussed with the patient that the best thing is to proceed with a left heart cath. He is agreeable.   Informed Consent   Shared Decision Making/Informed Consent The risks [stroke (1 in 1000), death (1 in 1000), kidney failure [usually temporary] (1 in 500), bleeding (1 in 200), allergic reaction [possibly serious] (1 in 200)], benefits (diagnostic support  and management of coronary artery disease) and alternatives of a cardiac catheterization were discussed in detail with Mr. Wrinkle and he is willing to proceed.     In the meantime we will continue with medical therapy - heparin  gtt, Aspirin  81 mg daily, lipitor  40 mg daily.   He is planned for HD today   Due to hypotension some blood pressure medications have been  stopped. I agree with this.   Savannah Morford DO, MS [facility] Attending Cardiologist [facility]  [address] [place], [place] [address] [phone] Website: [email or web address]

## 2024-03-16 NOTE — Progress Notes (Addendum)
 Received patient in bed to unit.  Alert and oriented.  Informed consent signed and in chart.   TX duration:3 and 15 minutes   Patient tolerated well.  Transported back to the room  Alert, without acute distress.  Hand-off given to patient's nurse.   Access used: Left Upper arm fistula Access issues: none  Total UF removed: 2.5L Medication(s) given: none   Camellia Brasil LPN [facility]

## 2024-03-16 NOTE — Progress Notes (Addendum)
 [facility] Progress Note   Subjective:    Seen and examined patient at bedside. Bipap is now off and transitioned to 2L O2. Noted patient was cramping shortly after treatment started yesterday so only 1.9L was removed. Recent Chest CT and CXR suggest ongoing vascular congestion and edema. Plan to dialyze patient again today. UFG set 2.5L.  Objective Vitals:   03/16/24 1136 03/16/24 1137 03/16/24 1150 03/16/24 1200  BP:  (!) 117/55 (!) 118/58 (!) 114/55  Pulse:  (!) 49 (!) 48 (!) 50  Resp:  18 14 14   Temp:  97.8 F (36.6 C) 97.8 F (36.6 C)   TempSrc:  Oral    SpO2:  100% 99% 100%  Weight: 116.2 kg     Height:       Physical Exam General: Awake, alert, on 2L O2, NAD Heart: S1 and S2; No murmurs, gallops, or rubs Lungs: Diminished; No rales or wheezing Abdomen: Soft and non-tender Extremities: Trace edema LLE; No edema RLE Dialysis Access: L AVF (+) B/T   Filed Weights   03/15/24 0841 03/16/24 0422 03/16/24 1136  Weight: 122.9 kg 118.1 kg 116.2 kg    Intake/Output Summary (Last 24 hours) at 03/16/2024 1225 Last data filed at 03/16/2024 0542 Gross per 24 hour  Intake 205.77 ml  Output 200 ml  Net 5.77 ml    Additional Objective Labs: Basic Metabolic Panel: Recent Labs  Lab 03/15/24 0529 03/15/24 1519 03/15/24 1658 03/16/24 0520  NA 142 139 139 140  K 4.9 3.6 3.8 3.8  CL 107  --  101 102  CO2 12*  --  18* 19*  GLUCOSE 160*  --  113* 94  BUN 158*  --  93* 106*  CREATININE 14.62*  --  10.01* 11.07*  CALCIUM  7.0*  --  7.5* 7.2*  PHOS >30.0*  --  6.4* 10.3*   Liver Function Tests: Recent Labs  Lab 03/15/24 1658 03/16/24 0520  AST 21  --   ALT 12  --   ALKPHOS 59  --   BILITOT 1.1  --   PROT 6.8  --   ALBUMIN  3.3* 2.8*   No results for input(s): LIPASE, AMYLASE in the last 168 hours. CBC: Recent Labs  Lab 03/14/24 2029 03/14/24 2210 03/15/24 0529 03/15/24 1519 03/15/24 1658 03/16/24 0520  WBC 10.6*  --  10.8*  --  10.6* 6.1   NEUTROABS  --   --   --   --  9.7* 4.8  HGB 10.2*   < > 9.6* 10.5* 9.0* 8.0*  HCT 30.7*   < > 29.1* 31.0* 26.2* 23.6*  MCV 94.8  --  96.0  --  92.9 93.7  PLT 160  --  151  --  139* 145*   < > = values in this interval not displayed.   Blood Culture    Component Value Date/Time   SDES BLOOD RIGHT [facility] 12/23/2023 1025   SPECREQUEST  12/23/2023 1025    BOTTLES DRAWN AEROBIC AND ANAEROBIC Blood Culture adequate volume   CULT  12/23/2023 1025    NO GROWTH 5 DAYS Performed at [facility], [address] [place], [place] [address]    REPTSTATUS 12/28/2023 FINAL 12/23/2023 1025    Cardiac Enzymes: No results for input(s): CKTOTAL, CKMB, CKMBINDEX, TROPONINI in the last 168 hours. CBG: Recent Labs  Lab 03/15/24 1656 03/15/24 2043 03/16/24 0752  GLUCAP 108* 108* 89   Iron Studies: No results for input(s): IRON, TIBC, TRANSFERRIN, FERRITIN in the last  72 hours. Lab Results  Component Value Date   INR 1.3 (H) 08/05/2023   INR 1.0 05/04/2022   INR 0.85 04/09/2018   Studies/Results: ECHOCARDIOGRAM COMPLETE Result Date: 03/16/2024    ECHOCARDIOGRAM REPORT   Patient Name:   [facility] Date of Exam: 03/16/2024 Medical Rec #:  [id number]      Height:       72.0 in Accession #:    [id number]     Weight:       260.4 lb Date of Birth:  March 16, 1971     BSA:          2.383 m Patient Age:    52 years       BP:           112/53 mmHg Patient Gender: M              HR:           57 bpm. Exam Location:  Inpatient Procedure: 2D Echo, Cardiac Doppler and Color Doppler (Both Spectral and Color            Flow Doppler were utilized during procedure). Indications:    Pericaridal Effusion  History:        Patient has prior history of Echocardiogram examinations.                 Arrythmias:Atrial Fibrillation; Risk Factors:Hypertension,                 Diabetes and Dyslipidemia.  Sonographer:    CM Referring Phys: [id number] SYLVESTER I OGBATA IMPRESSIONS  1. Left ventricular ejection  fraction, by estimation, is 60 to 65%. The left ventricle has normal function. The left ventricle has no regional wall motion abnormalities. There is moderate concentric left ventricular hypertrophy. Left ventricular diastolic parameters are consistent with Grade I diastolic dysfunction (impaired relaxation).  2. Right ventricular systolic function is normal. The right ventricular size is moderately enlarged.  3. The mitral valve is normal in structure. No evidence of mitral valve regurgitation. No evidence of mitral stenosis.  4. The aortic valve is tricuspid. There is mild calcification of the aortic valve. Aortic valve regurgitation is not visualized. Aortic valve sclerosis is present, with no evidence of aortic valve stenosis.  5. The inferior vena cava is normal in size with <50% respiratory variability, suggesting right atrial pressure of 8 mmHg. Comparison(s): No significant change from prior study. Prior images reviewed side by side. FINDINGS  Left Ventricle: Left ventricular ejection fraction, by estimation, is 60 to 65%. The left ventricle has normal function. The left ventricle has no regional wall motion abnormalities. The left ventricular internal cavity size was normal in size. There is  moderate concentric left ventricular hypertrophy. Left ventricular diastolic parameters are consistent with Grade I diastolic dysfunction (impaired relaxation). Right Ventricle: The right ventricular size is moderately enlarged. No increase in right ventricular wall thickness. Right ventricular systolic function is normal. Left Atrium: Left atrial size was normal in size. Right Atrium: Right atrial size was normal in size. Pericardium: There is no evidence of pericardial effusion. Presence of epicardial fat layer. Mitral Valve: The mitral valve is normal in structure. No evidence of mitral valve regurgitation. No evidence of mitral valve stenosis. Tricuspid Valve: The tricuspid valve is normal in structure. Tricuspid  valve regurgitation is not demonstrated. No evidence of tricuspid stenosis. Aortic Valve: The aortic valve is tricuspid. There is mild calcification of the aortic valve. Aortic valve regurgitation is not visualized. Aortic valve sclerosis  is present, with no evidence of aortic valve stenosis. Aortic valve mean gradient measures 5.0 mmHg. Aortic valve peak gradient measures 10.2 mmHg. Aortic valve area, by VTI measures 2.55 cm. Pulmonic Valve: The pulmonic valve was normal in structure. Pulmonic valve regurgitation is trivial. No evidence of pulmonic stenosis. Aorta: The aortic root and ascending aorta are structurally normal, with no evidence of dilitation. Venous: The inferior vena cava is normal in size with less than 50% respiratory variability, suggesting right atrial pressure of 8 mmHg. IAS/Shunts: The atrial septum is grossly normal.  LEFT VENTRICLE PLAX 2D LVIDd:         5.40 cm   Diastology LVIDs:         3.60 cm   LV e' medial:    8.49 cm/s LV PW:         1.20 cm   LV E/e' medial:  7.3 LV IVS:        1.40 cm   LV e' lateral:   6.53 cm/s LVOT diam:     2.30 cm   LV E/e' lateral: 9.4 LV SV:         111 LV SV Index:   46 LVOT Area:     4.15 cm  RIGHT VENTRICLE            IVC RV S prime:     8.16 cm/s  IVC diam: 2.70 cm TAPSE (M-mode): 2.2 cm AORTIC VALVE AV Area (Vmax):    2.73 cm AV Area (Vmean):   2.62 cm AV Area (VTI):     2.55 cm AV Vmax:           160.00 cm/s AV Vmean:          111.000 cm/s AV VTI:            0.434 m AV Peak Grad:      10.2 mmHg AV Mean Grad:      5.0 mmHg LVOT Vmax:         105.00 cm/s LVOT Vmean:        70.100 cm/s LVOT VTI:          0.266 m LVOT/AV VTI ratio: 0.61  AORTA Ao Root diam: 3.50 cm Ao Asc diam:  3.90 cm MITRAL VALVE MV Area (PHT): 3.54 cm    SHUNTS MV Decel Time: 214 msec    Systemic VTI:  0.27 m MV E velocity: 61.60 cm/s  Systemic Diam: 2.30 cm MV A velocity: 59.60 cm/s MV E/A ratio:  1.03 Stanly Leavens MD Electronically signed by Stanly Leavens MD  Signature Date/Time: 03/16/2024/10:06:19 AM    Final    DG Chest Port 1 View Result Date: 03/16/2024 CLINICAL DATA:  Shortness of breath. EXAM: PORTABLE CHEST 1 VIEW COMPARISON:  03/15/2024 FINDINGS: Low volume film. The cardio pericardial silhouette is enlarged. Vascular congestion with diffuse interstitial opacity suggests edema. Tiny pleural effusions. Telemetry leads overlie the chest. IMPRESSION: Low volume film with vascular congestion and diffuse interstitial opacity suggesting edema. Electronically Signed   By: Camellia Candle M.D.   On: 03/16/2024 07:04   CT Chest Wo Contrast Result Date: 03/15/2024 CLINICAL DATA:  Dyspnea, chronic, central airway disease suspected EXAM: CT CHEST WITHOUT CONTRAST TECHNIQUE: Multidetector CT imaging of the chest was performed following the standard protocol without IV contrast. RADIATION DOSE REDUCTION: This exam was performed according to the departmental dose-optimization program which includes automated exposure control, adjustment of the mA and/or kV according to patient size and/or use of iterative reconstruction technique. COMPARISON:  Radiograph earlier today.  Chest CT 12/24/2023 FINDINGS: Cardiovascular: The heart is enlarged. There are coronary artery calcifications. No pericardial effusion. Aortic atherosclerosis. No aortic aneurysm. Main pulmonary artery is dilated at 3.8 cm. Mediastinum/Nodes: Shotty mediastinal lymphadenopathy, largest lymph node measures 9 mm short axis series 2, image 60. Hilar assessment is limited in the absence of IV contrast. Patulous esophagus without wall thickening. Lungs/Pleura: Small bilateral pleural effusions, right greater than left. These have slightly diminished since April exam. Associated compressive atelectasis in the lower lobes. Multifocal ground-glass and nodular opacities throughout both lungs with areas of septal thickening. Bowing of the distal trachea and central airways. Mild central bronchial thickening. Upper  Abdomen: Cholecystectomy. No acute upper abdominal findings or free fluid. Musculoskeletal: Flowing anterior osteophytes throughout the thoracic spine. There are no acute or suspicious osseous abnormalities. IMPRESSION: 1. Multifocal ground-glass and nodular opacities throughout both lungs with areas of septal thickening. Findings may represent pulmonary edema or atypical infection. 2. Small bilateral pleural effusions, right greater than left. These have slightly diminished since April exam. 3. Cardiomegaly with coronary artery calcifications. 4. Dilated main pulmonary artery, can be seen with pulmonary arterial hypertension. 5. Shotty mediastinal lymphadenopathy, likely reactive. 6. 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 Aortic Atherosclerosis (ICD10-I70.0). Electronically Signed   By: Andrea Gasman M.D.   On: 03/15/2024 18:06   DG CHEST PORT 1 VIEW Result Date: 03/15/2024 CLINICAL DATA:  Difficulty breathing, low O2 sats, tachycardia. EXAM: PORTABLE CHEST 1 VIEW COMPARISON:  03/14/2024 and CT chest 12/24/2023. FINDINGS: Trachea is midline. Heart is enlarged. Diffuse mixed interstitial and airspace opacification appears progressive from 03/14/2024. Tiny bilateral pleural effusions. IMPRESSION: Coarsening congestive heart failure. Electronically Signed   By: Newell Eke M.D.   On: 03/15/2024 15:02   DG Chest Portable 1 View Result Date: 03/14/2024 EXAM: 1 VIEW XRAY OF THE CHEST 03/14/2024 09:23:00 PM COMPARISON: 12/23/2023 CLINICAL HISTORY: Chest pain, dyspnea. The patient has missed dialysis for one week, is complaining of being unable to breathe, has a fistula in the left arm, low O2 sats, rapid heart rate of 150, and nasal O2 placed. FINDINGS: LUNGS AND PLEURA: No focal pulmonary opacity. No pulmonary edema. No pleural effusion. No pneumothorax. Bibasilar atelectasis. HEART AND MEDIASTINUM: Mild cardiomegaly, unchanged. BONES AND SOFT TISSUES: No  acute osseous abnormality. IMPRESSION: 1. Mild cardiomegaly, unchanged. 2. Bibasilar atelectasis. Electronically signed by: Franky Stanford MD 03/14/2024 09:32 PM EDT RP Workstation: [id number]    Medications:  amiodarone  Stopped (03/15/24 1400)   heparin  1,750 Units/hr (03/16/24 1117)    amiodarone   200 mg Oral BID   [START ON 03/17/2024] aspirin  EC  81 mg Oral Daily   aspirin   325 mg Oral Once   [START ON 03/17/2024] atorvastatin   40 mg Oral Daily   Chlorhexidine  Gluconate Cloth  6 each Topical Q0600   cyanocobalamin   1,000 mcg Subcutaneous Daily   [START ON 03/19/2024] vitamin B-12  1,000 mcg Oral Daily   dorzolamide -timolol   1 drop Both Eyes BID   insulin  aspart  0-6 Units Subcutaneous TID WC   levothyroxine   175 mcg Oral Q0600   metoprolol  tartrate  25 mg Oral BID   pregabalin   50-100 mg Oral QHS   sodium chloride  flush  3 mL Intravenous Q12H    Dialysis Orders: [facility] [phone] *will request records*  CXR: looks like vasc congestion, possible early IS edema   Assessment/Plan: Respiratory distress: Shortness of breath after missing 1 week of dialysis. On Bipap at admit. Received HD  yesterday but cramped shortly after treatment was started. 1.9L removed. Recent Chest CT and CXR suggest ongoing vascular congestion and edema. Plan for HD again today and continue to push UF as tolerated. Elevated toponins/A-flutter - Cardiology consulted ESRD: on HD MWF in [place]. See above HTN: Resume blood pressure lowering meds after dialysis Volume: Mild lower extremity edema, vascular congestion possible interstitial edema on x-ray. 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 His last HD there was on 03/04/24. Per the RN, patient refused to come to his treatments. Unclear why. Also unclear whether he's been discharged from his outpatient HD center. Reached out to on-call renal navigator today to ensure  patient has an outpatient HD center to go to upon discharge.  Charmaine Piety, NP [facility] 03/16/2024,12:25 PM  LOS: 2 days

## 2024-03-16 NOTE — Progress Notes (Signed)
 PROGRESS NOTE                                                                                                                                                                                                             Patient Demographics:    Gregory Crane, is a 53 y.o. male, DOB - [birth year], FMW:[id number]  Outpatient Primary MD for the patient is Gregory Shad, MD    LOS - 2  Admit date - 03/14/2024    Chief Complaint  Patient presents with   Shortness of Breath       Brief Narrative (HPI from H&P)   a 53 year old male, morbidly obese, with past medical history significant for end-stage renal disease on hemodialysis TTS, hypertension, diabetes mellitus, atrial fibrillation on anticoagulation, prior CVA, hyperlipidemia and hypothyroidism.  Patient snores, but has not been diagnosed with OSA nor worked up for OSA.  Prior to presentation, patient had missed hemodialysis for about a week.  He was admitted to the hospital with severe respiratory distress due to fluid overload, troponin was found to be high, nephrology cardiology were consulted and he was admitted for further care on BiPAP   Subjective:    Gregory Crane today has, No headache, No chest pain, No abdominal pain - No Nausea, No new weakness tingling or numbness, no shortness of breath   Assessment  & Plan :    Acute hypoxic respiratory failure due to fluid overload caused in a patient with ESRD who missed 3 dialysis sessions due to noncompliance.  Nephrology cardiology on board, initially required BiPAP, underwent urgent HD on 03/15/2024 morning for fluid removal, shortness of breath much improved now on nasal cannula and feeling much better will require few more dialysis sessions to get euvolemic.  Unseld on compliance with HD treatments.   Episode of severe respiratory distress after an HD treatment on 03/15/2024 with possible NSTEMI. With elevation of minimal  troponin, he had no chest pain, was seen by cardiology and placed on heparin  drip, remains chest pain-free, troponin trend has been relatively flat which is reassuring, initially required BiPAP now off of it, he was chronically on Eliquis  hence chances of PE is low however VQ scan ordered by previous team will be completed   Afib/flutter with rapid ventricular response - P/w rates in 150s, intermittent wide complex tachycardia, ? Abberrancy.  Cardiology  on board, currently on metoprolol  and amiodarone  along with heparin  drip, was on Eliquis  at home, Cardizem  held due to low blood pressure.  Monitor on telemetry defer further management to cardiology   Hx CVA: on DOAC monotherapy Eliquis  which is now transitioned to heparin  due to NSTEMI, not on cholesterol lowering agent, no acute issues.  HTN: Blood pressure running soft currently only on beta-blocker will monitor.  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 SSI for very sensitive   Lab Results  Component Value Date   HGBA1C 6.2 (H) 12/24/2023   CBG (last 3)  Recent Labs    03/15/24 1656 03/15/24 2043 03/16/24 0752  GLUCAP 108* 108* 89           Condition - Extremely Guarded  Family Communication  : None present  Code Status : Full code  Consults  : Nephrology, cardiology  PUD Prophylaxis :    Procedures  :     TTE 1. Left ventricular ejection fraction, by estimation, is 60 to 65%. The left ventricle has normal function. The left ventricle has no regional wall motion abnormalities. There is moderate concentric left ventricular hypertrophy. Left ventricular diastolic parameters are consistent with Grade I diastolic dysfunction (impaired relaxation).  2. Right ventricular systolic function is normal. The right ventricular size is moderately enlarged.  3. The mitral valve is normal in structure. No evidence of mitral valve  regurgitation. No evidence of mitral stenosis.  4. The aortic valve is tricuspid. There is mild calcification of the aortic valve. Aortic valve regurgitation is not visualized. Aortic valve sclerosis is present, with no evidence of aortic valve stenosis. 5. The inferior vena cava is normal in size with <50% respiratory variability, suggesting right atrial pressure of 8 mmHg.   CT - 1. Multifocal ground-glass and nodular opacities throughout both lungs with areas of septal thickening. Findings may represent pulmonary edema or atypical infection. 2. Small bilateral pleural effusions, right greater than left. These have slightly diminished since April exam. 3. Cardiomegaly with coronary artery calcifications. 4. Dilated main pulmonary artery, can be seen with pulmonary arterial hypertension. 5. Shotty mediastinal lymphadenopathy, likely reactive. 6. 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 Aortic Atherosclerosis (ICD10-I70.0).        Disposition Plan  :    Status is: Inpatient   DVT Prophylaxis  :  Hep gtt    Lab Results  Component Value Date   PLT 145 (L) 03/16/2024    Diet :  Diet Order             Diet NPO time specified Except for: Ice Chips, Sips with Meds  Diet effective now                    Inpatient Medications  Scheduled Meds:  amiodarone   200 mg Oral BID   Chlorhexidine  Gluconate Cloth  6 each Topical Q0600   cyanocobalamin   1,000 mcg Subcutaneous Daily   [START ON 03/19/2024] vitamin B-12  1,000 mcg Oral Daily   dorzolamide -timolol   1 drop Both Eyes BID   insulin  aspart  0-6 Units Subcutaneous TID WC   levothyroxine   175 mcg Oral Q0600   metoprolol  tartrate  25 mg Oral BID   pregabalin   50-100 mg Oral QHS   rosuvastatin   5 mg Oral Daily   sodium chloride  flush  3 mL Intravenous Q12H   Continuous Infusions:  amiodarone  Stopped (03/15/24 1400)   heparin  1,750 Units/hr (03/16/24 0910)   PRN Meds:.acetaminophen ,  albuterol , alteplase , heparin , lidocaine  (PF), lidocaine -prilocaine , melatonin, ondansetron  (ZOFRAN ) IV, pentafluoroprop-tetrafluoroeth, polyethylene glycol  Antibiotics  :    Anti-infectives (From admission, onward)    None         Objective:   Vitals:   03/16/24 0422 03/16/24 0752 03/16/24 0820 03/16/24 0915  BP:  (!) 112/52  (!) 112/52  Pulse: (!) 52 (!) 48 (!) 47 (!) 49  Resp: 16 14 12    Temp:  97.7 F (36.5 C)    TempSrc:  Axillary    SpO2: 99% 99% 100%   Weight: 118.1 kg     Height:        Wt Readings from Last 3 Encounters:  03/16/24 118.1 kg  02/23/24 122.9 kg  01/30/24 115.6 kg     Intake/Output Summary (Last 24 hours) at 03/16/2024 1027 Last data filed at 03/16/2024 0542 Gross per 24 hour  Intake 205.77 ml  Output 2100 ml  Net -1894.23 ml     Physical Exam  Awake Alert, No new F.N deficits, Normal affect [place].AT,PERRAL Supple Neck, No JVD,   Symmetrical Chest wall movement, Good air movement bilaterally, CTAB RRR,No Gallops,Rubs or new Murmurs,  +ve B.Sounds, Abd Soft, No tenderness,   No Cyanosis, Clubbing or edema       Data Review:    Recent Labs  Lab 03/14/24 2029 03/14/24 2210 03/15/24 0529 03/15/24 1519 03/15/24 1658 03/16/24 0520  WBC 10.6*  --  10.8*  --  10.6* 6.1  HGB 10.2* 8.5* 9.6* 10.5* 9.0* 8.0*  HCT 30.7* 25.0* 29.1* 31.0* 26.2* 23.6*  PLT 160  --  151  --  139* 145*  MCV 94.8  --  96.0  --  92.9 93.7  MCH 31.5  --  31.7  --  31.9 31.7  MCHC 33.2  --  33.0  --  34.4 33.9  RDW 13.2  --  13.4  --  13.1 13.3  LYMPHSABS  --   --   --   --  0.3* 0.8  MONOABS  --   --   --   --  0.6 0.5  EOSABS  --   --   --   --  0.0 0.0  BASOSABS  --   --   --   --  0.0 0.0    Recent Labs  Lab 03/14/24 2029 03/14/24 2210 03/14/24 2228 03/15/24 0529 03/15/24 1519 03/15/24 1658 03/16/24 0520  NA 142 141  --  142 139 139 140  K 4.7 4.6  --  4.9 3.6 3.8 3.8  CL 106  --   --  107  --  101 102  CO2 12*  --   --  12*  --  18* 19*   ANIONGAP 24*  --   --  23*  --  20* 19*  GLUCOSE 147*  --   --  160*  --  113* 94  BUN 154*  --   --  158*  --  93* 106*  CREATININE 14.73*  --   --  14.62*  --  10.01* 11.07*  AST  --   --   --   --   --  21  --   ALT  --   --   --   --   --  12  --   ALKPHOS  --   --   --   --   --  59  --   BILITOT  --   --   --   --   --  1.1  --   ALBUMIN   --   --   --   --   --  3.3* 2.8*  DDIMER  --   --   --   --   --  1.25*  --   LATICACIDVEN  --   --  0.8  --   --   --   --   TSH  --   --   --  6.757*  --   --   --   MG 2.6*  --   --  2.6*  --   --  2.2  PHOS  --   --   --  >30.0*  --  6.4* 10.3*  CALCIUM  7.0*  --   --  7.0*  --  7.5* 7.2*      Recent Labs  Lab 03/14/24 2029 03/14/24 2228 03/15/24 0529 03/15/24 1658 03/16/24 0520  DDIMER  --   --   --  1.25*  --   LATICACIDVEN  --  0.8  --   --   --   TSH  --   --  6.757*  --   --   MG 2.6*  --  2.6*  --  2.2  CALCIUM  7.0*  --  7.0* 7.5* 7.2*    --------------------------------------------------------------------------------------------------------------- Lab Results  Component Value Date   CHOL 129 10/09/2023   HDL 34.10 (L) 10/09/2023   LDLCALC 66 10/09/2023   LDLDIRECT 107.0 06/26/2021   TRIG 146.0 10/09/2023   CHOLHDL 4 10/09/2023    Lab Results  Component Value Date   HGBA1C 6.2 (H) 12/24/2023   Recent Labs    03/15/24 0529 03/15/24 1658  TSH 6.757*  --   FREET4  --  1.10   Recent Labs    03/16/24 0520  VITAMINB12 113*  FOLATE 18.6   ------------------------------------------------------------------------------------------------------------------ Cardiac Enzymes No results for input(s): CKMB, TROPONINI, MYOGLOBIN in the last 168 hours.  Invalid input(s): CK  Micro Results No results found for this or any previous visit (from the past 240 hours).  Radiology Report ECHOCARDIOGRAM COMPLETE Result Date: 03/16/2024    ECHOCARDIOGRAM REPORT   Patient Name:   Gregory Crane Date of Exam:  03/16/2024 Medical Rec #:  [id number]      Height:       72.0 in Accession #:    [id number]     Weight:       260.4 lb Date of Birth:  1971/04/17     BSA:          2.383 m Patient Age:    52 years       BP:           112/53 mmHg Patient Gender: M              HR:           57 bpm. Exam Location:  Inpatient Procedure: 2D Echo, Cardiac Doppler and Color Doppler (Both Spectral and Color            Flow Doppler were utilized during procedure). Indications:    Pericaridal Effusion  History:        Patient has prior history of Echocardiogram examinations.                 Arrythmias:Atrial Fibrillation; Risk Factors:Hypertension,  Diabetes and Dyslipidemia.  Sonographer:    CM Referring Phys: [id number] SYLVESTER I OGBATA IMPRESSIONS  1. Left ventricular ejection fraction, by estimation, is 60 to 65%. The left ventricle has normal function. The left ventricle has no regional wall motion abnormalities. There is moderate concentric left ventricular hypertrophy. Left ventricular diastolic parameters are consistent with Grade I diastolic dysfunction (impaired relaxation).  2. Right ventricular systolic function is normal. The right ventricular size is moderately enlarged.  3. The mitral valve is normal in structure. No evidence of mitral valve regurgitation. No evidence of mitral stenosis.  4. The aortic valve is tricuspid. There is mild calcification of the aortic valve. Aortic valve regurgitation is not visualized. Aortic valve sclerosis is present, with no evidence of aortic valve stenosis.  5. The inferior vena cava is normal in size with <50% respiratory variability, suggesting right atrial pressure of 8 mmHg. Comparison(s): No significant change from prior study. Prior images reviewed side by side. FINDINGS  Left Ventricle: Left ventricular ejection fraction, by estimation, is 60 to 65%. The left ventricle has normal function. The left ventricle has no regional wall motion abnormalities. The left ventricular  internal cavity size was normal in size. There is  moderate concentric left ventricular hypertrophy. Left ventricular diastolic parameters are consistent with Grade I diastolic dysfunction (impaired relaxation). Right Ventricle: The right ventricular size is moderately enlarged. No increase in right ventricular wall thickness. Right ventricular systolic function is normal. Left Atrium: Left atrial size was normal in size. Right Atrium: Right atrial size was normal in size. Pericardium: There is no evidence of pericardial effusion. Presence of epicardial fat layer. Mitral Valve: The mitral valve is normal in structure. No evidence of mitral valve regurgitation. No evidence of mitral valve stenosis. Tricuspid Valve: The tricuspid valve is normal in structure. Tricuspid valve regurgitation is not demonstrated. No evidence of tricuspid stenosis. Aortic Valve: The aortic valve is tricuspid. There is mild calcification of the aortic valve. Aortic valve regurgitation is not visualized. Aortic valve sclerosis is present, with no evidence of aortic valve stenosis. Aortic valve mean gradient measures 5.0 mmHg. Aortic valve peak gradient measures 10.2 mmHg. Aortic valve area, by VTI measures 2.55 cm. Pulmonic Valve: The pulmonic valve was normal in structure. Pulmonic valve regurgitation is trivial. No evidence of pulmonic stenosis. Aorta: The aortic root and ascending aorta are structurally normal, with no evidence of dilitation. Venous: The inferior vena cava is normal in size with less than 50% respiratory variability, suggesting right atrial pressure of 8 mmHg. IAS/Shunts: The atrial septum is grossly normal.  LEFT VENTRICLE PLAX 2D LVIDd:         5.40 cm   Diastology LVIDs:         3.60 cm   LV e' medial:    8.49 cm/s LV PW:         1.20 cm   LV E/e' medial:  7.3 LV IVS:        1.40 cm   LV e' lateral:   6.53 cm/s LVOT diam:     2.30 cm   LV E/e' lateral: 9.4 LV SV:         111 LV SV Index:   46 LVOT Area:     4.15 cm   RIGHT VENTRICLE            IVC RV S prime:     8.16 cm/s  IVC diam: 2.70 cm TAPSE (M-mode): 2.2 cm AORTIC VALVE AV Area (Vmax):    2.73 cm  AV Area (Vmean):   2.62 cm AV Area (VTI):     2.55 cm AV Vmax:           160.00 cm/s AV Vmean:          111.000 cm/s AV VTI:            0.434 m AV Peak Grad:      10.2 mmHg AV Mean Grad:      5.0 mmHg LVOT Vmax:         105.00 cm/s LVOT Vmean:        70.100 cm/s LVOT VTI:          0.266 m LVOT/AV VTI ratio: 0.61  AORTA Ao Root diam: 3.50 cm Ao Asc diam:  3.90 cm MITRAL VALVE MV Area (PHT): 3.54 cm    SHUNTS MV Decel Time: 214 msec    Systemic VTI:  0.27 m MV E velocity: 61.60 cm/s  Systemic Diam: 2.30 cm MV A velocity: 59.60 cm/s MV E/A ratio:  1.03 Stanly Leavens MD Electronically signed by Stanly Leavens MD Signature Date/Time: 03/16/2024/10:06:19 AM    Final    DG Chest Port 1 View Result Date: 03/16/2024 CLINICAL DATA:  Shortness of breath. EXAM: PORTABLE CHEST 1 VIEW COMPARISON:  03/15/2024 FINDINGS: Low volume film. The cardio pericardial silhouette is enlarged. Vascular congestion with diffuse interstitial opacity suggests edema. Tiny pleural effusions. Telemetry leads overlie the chest. IMPRESSION: Low volume film with vascular congestion and diffuse interstitial opacity suggesting edema. Electronically Signed   By: Camellia Candle M.D.   On: 03/16/2024 07:04   CT Chest Wo Contrast Result Date: 03/15/2024 CLINICAL DATA:  Dyspnea, chronic, central airway disease suspected EXAM: CT CHEST WITHOUT CONTRAST TECHNIQUE: Multidetector CT imaging of the chest was performed following the standard protocol without IV contrast. RADIATION DOSE REDUCTION: This exam was performed according to the departmental dose-optimization program which includes automated exposure control, adjustment of the mA and/or kV according to patient size and/or use of iterative reconstruction technique. COMPARISON:  Radiograph earlier today.  Chest CT 12/24/2023 FINDINGS: Cardiovascular:  The heart is enlarged. There are coronary artery calcifications. No pericardial effusion. Aortic atherosclerosis. No aortic aneurysm. Main pulmonary artery is dilated at 3.8 cm. Mediastinum/Nodes: Shotty mediastinal lymphadenopathy, largest lymph node measures 9 mm short axis series 2, image 60. Hilar assessment is limited in the absence of IV contrast. Patulous esophagus without wall thickening. Lungs/Pleura: Small bilateral pleural effusions, right greater than left. These have slightly diminished since April exam. Associated compressive atelectasis in the lower lobes. Multifocal ground-glass and nodular opacities throughout both lungs with areas of septal thickening. Bowing of the distal trachea and central airways. Mild central bronchial thickening. Upper Abdomen: Cholecystectomy. No acute upper abdominal findings or free fluid. Musculoskeletal: Flowing anterior osteophytes throughout the thoracic spine. There are no acute or suspicious osseous abnormalities. IMPRESSION: 1. Multifocal ground-glass and nodular opacities throughout both lungs with areas of septal thickening. Findings may represent pulmonary edema or atypical infection. 2. Small bilateral pleural effusions, right greater than left. These have slightly diminished since April exam. 3. Cardiomegaly with coronary artery calcifications. 4. Dilated main pulmonary artery, can be seen with pulmonary arterial hypertension. 5. Shotty mediastinal lymphadenopathy, likely reactive. 6. 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 Aortic Atherosclerosis (ICD10-I70.0). Electronically Signed   By: Andrea Gasman M.D.   On: 03/15/2024 18:06   DG CHEST PORT 1 VIEW Result Date: 03/15/2024 CLINICAL DATA:  Difficulty breathing, low O2 sats, tachycardia. EXAM: PORTABLE  CHEST 1 VIEW COMPARISON:  03/14/2024 and CT chest 12/24/2023. FINDINGS: Trachea is midline. Heart is enlarged. Diffuse mixed interstitial and airspace  opacification appears progressive from 03/14/2024. Tiny bilateral pleural effusions. IMPRESSION: Coarsening congestive heart failure. Electronically Signed   By: Newell Eke M.D.   On: 03/15/2024 15:02   DG Chest Portable 1 View Result Date: 03/14/2024 EXAM: 1 VIEW XRAY OF THE CHEST 03/14/2024 09:23:00 PM COMPARISON: 12/23/2023 CLINICAL HISTORY: Chest pain, dyspnea. The patient has missed dialysis for one week, is complaining of being unable to breathe, has a fistula in the left arm, low O2 sats, rapid heart rate of 150, and nasal O2 placed. FINDINGS: LUNGS AND PLEURA: No focal pulmonary opacity. No pulmonary edema. No pleural effusion. No pneumothorax. Bibasilar atelectasis. HEART AND MEDIASTINUM: Mild cardiomegaly, unchanged. BONES AND SOFT TISSUES: No acute osseous abnormality. IMPRESSION: 1. Mild cardiomegaly, unchanged. 2. Bibasilar atelectasis. Electronically signed by: Franky Stanford MD 03/14/2024 09:32 PM EDT RP Workstation: [id number]     Signature  -   Lavada Stank M.D on 03/16/2024 at 10:27 AM   -  To page go to [email or web address]

## 2024-03-16 NOTE — Plan of Care (Signed)

## 2024-03-16 NOTE — Progress Notes (Signed)
 Pt taken off BiPAP per MD. Pt tolerating well at this time. Vitals stable no increased WOB noted, SpO2 100%, RN aware, MD notified.      03/16/24 0820  Therapy Vitals  Pulse Rate (!) 47  Resp 12  MEWS Score/Color  MEWS Score 2  MEWS Score Color Yellow  Respiratory Assessment  Assessment Type Assess only  Respiratory Pattern Regular;Unlabored  Chest Assessment Chest expansion symmetrical  Cough None  Bilateral Breath Sounds Diminished  Oxygen Therapy/Pulse Ox  O2 Device (S)  Room Air  O2 Therapy Room air  SpO2 100 %

## 2024-03-17 ENCOUNTER — Telehealth: Payer: Self-pay

## 2024-03-17 ENCOUNTER — Ambulatory Visit: Payer: Self-pay

## 2024-03-17 ENCOUNTER — Encounter (HOSPITAL_COMMUNITY)
Admission: EM | Disposition: A | Discharge status: Home / Self Care | Payer: Self-pay | Source: Home / Self Care | Attending: Internal Medicine

## 2024-03-17 ENCOUNTER — Ambulatory Visit: Payer: Medicare Other

## 2024-03-17 ENCOUNTER — Other Ambulatory Visit: Payer: Self-pay

## 2024-03-17 ENCOUNTER — Inpatient Hospital Stay (HOSPITAL_COMMUNITY)

## 2024-03-17 DIAGNOSIS — I2510 Atherosclerotic heart disease of native coronary artery without angina pectoris: Secondary | ICD-10-CM | POA: Diagnosis not present

## 2024-03-17 DIAGNOSIS — E785 Hyperlipidemia, unspecified: Secondary | ICD-10-CM | POA: Diagnosis not present

## 2024-03-17 DIAGNOSIS — E8770 Fluid overload, unspecified: Secondary | ICD-10-CM | POA: Diagnosis not present

## 2024-03-17 DIAGNOSIS — I480 Paroxysmal atrial fibrillation: Secondary | ICD-10-CM | POA: Diagnosis not present

## 2024-03-17 DIAGNOSIS — I214 Non-ST elevation (NSTEMI) myocardial infarction: Secondary | ICD-10-CM | POA: Diagnosis not present

## 2024-03-17 DIAGNOSIS — Z9861 Coronary angioplasty status: Secondary | ICD-10-CM | POA: Diagnosis not present

## 2024-03-17 HISTORY — PX: LEFT HEART CATH AND CORONARY ANGIOGRAPHY: CATH118249

## 2024-03-17 HISTORY — PX: CORONARY STENT INTERVENTION: CATH118234

## 2024-03-17 LAB — RENAL FUNCTION PANEL
Albumin: 2.8 g/dL — ABNORMAL LOW (ref 3.5–5.0)
Anion gap: 18 — ABNORMAL HIGH (ref 5–15)
BUN: 66 mg/dL — ABNORMAL HIGH (ref 6–20)
CO2: 22 mmol/L (ref 22–32)
Calcium: 7.7 mg/dL — ABNORMAL LOW (ref 8.9–10.3)
Chloride: 98 mmol/L (ref 98–111)
Creatinine, Ser: 7.89 mg/dL — ABNORMAL HIGH (ref 0.61–1.24)
GFR, Estimated: 8 mL/min — ABNORMAL LOW (ref >60)
Glucose, Bld: 76 mg/dL (ref 70–99)
Phosphorus: 7.9 mg/dL — ABNORMAL HIGH (ref 2.5–4.6)
Potassium: 3.8 mmol/L (ref 3.5–5.1)
Sodium: 138 mmol/L (ref 135–145)

## 2024-03-17 LAB — CBC WITH DIFFERENTIAL/PLATELET
Abs Immature Granulocytes: 0.03 K/uL (ref 0.00–0.07)
Basophils Absolute: 0 K/uL (ref 0.0–0.1)
Basophils Relative: 1 %
Eosinophils Absolute: 0.1 K/uL (ref 0.0–0.5)
Eosinophils Relative: 1 %
HCT: 27.2 % — ABNORMAL LOW (ref 39.0–52.0)
Hemoglobin: 9.2 g/dL — ABNORMAL LOW (ref 13.0–17.0)
Immature Granulocytes: 1 %
Lymphocytes Relative: 13 %
Lymphs Abs: 0.7 K/uL (ref 0.7–4.0)
MCH: 31.4 pg (ref 26.0–34.0)
MCHC: 33.8 g/dL (ref 30.0–36.0)
MCV: 92.8 fL (ref 80.0–100.0)
Monocytes Absolute: 0.5 K/uL (ref 0.1–1.0)
Monocytes Relative: 9 %
Neutro Abs: 3.9 K/uL (ref 1.7–7.7)
Neutrophils Relative %: 75 %
Platelets: 172 K/uL (ref 150–400)
RBC: 2.93 MIL/uL — ABNORMAL LOW (ref 4.22–5.81)
RDW: 13 % (ref 11.5–15.5)
WBC: 5.2 K/uL (ref 4.0–10.5)
nRBC: 0 % (ref 0.0–0.2)

## 2024-03-17 LAB — HEPARIN LEVEL (UNFRACTIONATED): Heparin Unfractionated: >1.1 [IU]/mL — ABNORMAL HIGH (ref 0.30–0.70)

## 2024-03-17 LAB — APTT: aPTT: 70 s — ABNORMAL HIGH (ref 24–36)

## 2024-03-17 LAB — GLUCOSE, CAPILLARY
Glucose-Capillary: 71 mg/dL (ref 70–99)
Glucose-Capillary: 94 mg/dL (ref 70–99)
Glucose-Capillary: 148 mg/dL — ABNORMAL HIGH (ref 70–99)

## 2024-03-17 LAB — POCT ACTIVATED CLOTTING TIME
Activated Clotting Time: 245 s
Activated Clotting Time: 256 s
Activated Clotting Time: 187 s
Activated Clotting Time: 158 s

## 2024-03-17 SURGERY — LEFT HEART CATH AND CORONARY ANGIOGRAPHY
Anesthesia: LOCAL

## 2024-03-17 MED ORDER — FAMOTIDINE IN NACL 20-0.9 MG/50ML-% IV SOLN
INTRAVENOUS | Status: AC
  Filled 2024-03-17: qty 50

## 2024-03-17 MED ORDER — ONDANSETRON HCL 4 MG/2ML IJ SOLN
INTRAMUSCULAR | Status: AC
  Filled 2024-03-17: qty 2

## 2024-03-17 MED ORDER — HYDRALAZINE HCL 20 MG/ML IJ SOLN: 10.0000 mg | INTRAMUSCULAR | Status: AC | PRN

## 2024-03-17 MED ORDER — FENTANYL CITRATE (PF) 100 MCG/2ML IJ SOLN
INTRAMUSCULAR | Status: DC | PRN
  Administered 2024-03-17 (×2): 25 ug via INTRAVENOUS

## 2024-03-17 MED ORDER — HEPARIN (PORCINE) IN NACL 1000-0.9 UT/500ML-% IV SOLN
INTRAVENOUS | Status: DC | PRN
  Administered 2024-03-17 (×2): 500 mL

## 2024-03-17 MED ORDER — CLOPIDOGREL BISULFATE 300 MG PO TABS
ORAL_TABLET | ORAL | Status: AC
Start: 2024-03-17 — End: 2024-03-17
  Filled 2024-03-17: qty 2

## 2024-03-17 MED ORDER — APIXABAN 5 MG PO TABS
5.0000 mg | ORAL_TABLET | Freq: Two times a day (BID) | ORAL | Status: DC
  Administered 2024-03-17 – 2024-03-18 (×2): 5 mg via ORAL
  Filled 2024-03-17 (×2): qty 1

## 2024-03-17 MED ORDER — LIDOCAINE HCL (PF) 1 % IJ SOLN
INTRAMUSCULAR | Status: AC
  Filled 2024-03-17: qty 30

## 2024-03-17 MED ORDER — HEPARIN SODIUM (PORCINE) 1000 UNIT/ML IJ SOLN
INTRAMUSCULAR | Status: AC
Start: 2024-03-17 — End: 2024-03-17
  Filled 2024-03-17: qty 20

## 2024-03-17 MED ORDER — ACETAMINOPHEN 500 MG PO TABS
ORAL_TABLET | ORAL | Status: AC
  Filled 2024-03-17: qty 2

## 2024-03-17 MED ORDER — HEPARIN SODIUM (PORCINE) 1000 UNIT/ML IJ SOLN
INTRAMUSCULAR | Status: DC | PRN
  Administered 2024-03-17: 10000 [IU] via INTRAVENOUS
  Administered 2024-03-17: 2000 [IU] via INTRAVENOUS

## 2024-03-17 MED ORDER — MIDAZOLAM HCL 2 MG/2ML IJ SOLN
INTRAMUSCULAR | Status: AC
  Filled 2024-03-17: qty 2

## 2024-03-17 MED ORDER — HYDROMORPHONE HCL 1 MG/ML IJ SOLN
INTRAMUSCULAR | Status: AC
  Filled 2024-03-17: qty 0.5

## 2024-03-17 MED ORDER — HYDROMORPHONE HCL 1 MG/ML IJ SOLN
0.5000 mg | INTRAMUSCULAR | Status: DC | PRN
  Administered 2024-03-17: 0.5 mg via INTRAVENOUS

## 2024-03-17 MED ORDER — MIDAZOLAM HCL 2 MG/2ML IJ SOLN
INTRAMUSCULAR | Status: DC | PRN
  Administered 2024-03-17 (×2): 1 mg via INTRAVENOUS

## 2024-03-17 MED ORDER — SODIUM CHLORIDE 0.9% FLUSH
3.0000 mL | Freq: Two times a day (BID) | INTRAVENOUS | Status: DC
  Administered 2024-03-17 – 2024-03-18 (×2): 3 mL via INTRAVENOUS

## 2024-03-17 MED ORDER — LIDOCAINE HCL (PF) 1 % IJ SOLN
INTRAMUSCULAR | Status: DC | PRN
  Administered 2024-03-17: 10 mL

## 2024-03-17 MED ORDER — LABETALOL HCL 5 MG/ML IV SOLN: 10.0000 mg | INTRAVENOUS | Status: AC | PRN

## 2024-03-17 MED ORDER — IOHEXOL 350 MG/ML SOLN
INTRAVENOUS | Status: DC | PRN
  Administered 2024-03-17: 120 mL

## 2024-03-17 MED ORDER — SODIUM CHLORIDE 0.9 % IV SOLN: INTRAVENOUS | Status: DC

## 2024-03-17 MED ORDER — SODIUM CHLORIDE 0.9% FLUSH: 3.0000 mL | INTRAVENOUS | Status: DC | PRN

## 2024-03-17 MED ORDER — CLOPIDOGREL BISULFATE 75 MG PO TABS: 75.0000 mg | ORAL_TABLET | Freq: Every day | ORAL | Status: DC

## 2024-03-17 MED ORDER — FENTANYL CITRATE (PF) 100 MCG/2ML IJ SOLN
INTRAMUSCULAR | Status: AC
  Filled 2024-03-17: qty 2

## 2024-03-17 MED ORDER — CHLORHEXIDINE GLUCONATE CLOTH 2 % EX PADS
6.0000 | MEDICATED_PAD | Freq: Every day | CUTANEOUS | Status: DC
  Administered 2024-03-18: 6 via TOPICAL

## 2024-03-17 MED ORDER — CLOPIDOGREL BISULFATE 300 MG PO TABS
ORAL_TABLET | ORAL | Status: DC | PRN
  Administered 2024-03-17: 600 mg via ORAL

## 2024-03-17 MED ORDER — ACETAMINOPHEN 325 MG PO TABS
ORAL_TABLET | ORAL | Status: AC
  Filled 2024-03-17: qty 2

## 2024-03-17 MED ORDER — SODIUM CHLORIDE 0.9 % IV SOLN: 250.0000 mL | INTRAVENOUS | Status: AC | PRN

## 2024-03-17 MED ORDER — FAMOTIDINE IN NACL 20-0.9 MG/50ML-% IV SOLN
INTRAVENOUS | Status: AC | PRN
  Administered 2024-03-17: 20 mg via INTRAVENOUS

## 2024-03-17 SURGICAL SUPPLY — 15 items
BALLOON EMERGE MR 2.0X12 (BALLOONS) IMPLANT
CATH INFINITI 5FR MULTPACK ANG (CATHETERS) IMPLANT
CATH LAUNCHER 6FR 3DRC (CATHETERS) IMPLANT
KIT ENCORE 26 ADVANTAGE (KITS) IMPLANT
KIT HEART LEFT (KITS) IMPLANT
KIT MICROPUNCTURE NIT STIFF (SHEATH) IMPLANT
PACK CARDIAC CATHETERIZATION (CUSTOM PROCEDURE TRAY) ×1 IMPLANT
SHEATH PINNACLE 5F 10CM (SHEATH) IMPLANT
SHEATH PINNACLE 6F 10CM (SHEATH) IMPLANT
SHEATH PROBE COVER 6X72 (BAG) IMPLANT
STENT ONYX FRONTIER 2.25X12 (Permanent Stent) IMPLANT
TRANSDUCER W/STOPCOCK (MISCELLANEOUS) IMPLANT
TUBING CIL FLEX 10 FLL-RA (TUBING) IMPLANT
WIRE ASAHI PROWATER 180CM (WIRE) IMPLANT
WIRE EMERALD 3MM-J .035X150CM (WIRE) IMPLANT

## 2024-03-17 NOTE — [facility] (Signed)
 Progress Note  Patient Name: Gregory Crane Date of Encounter: 03/17/2024  Primary Cardiologist: Gregory LITTIE Nanas, MD   Subjective   Patient seen and examined at his bedside in the cath lab post procedure area.  Status post PCI to the RCA.  Inpatient Medications    Scheduled Meds:  [MAR Hold] amiodarone   200 mg Oral BID   [MAR Hold] aspirin  EC  81 mg Oral Daily   [MAR Hold] aspirin   325 mg Oral Once   [MAR Hold] atorvastatin   40 mg Oral Daily   [MAR Hold] Chlorhexidine  Gluconate Cloth  6 each Topical Q0600   [START ON 03/18/2024] clopidogrel   75 mg Oral Q breakfast   [MAR Hold] cyanocobalamin   1,000 mcg Subcutaneous Daily   [MAR Hold] vitamin B-12  1,000 mcg Oral Daily   [MAR Hold] dorzolamide -timolol   1 drop Both Eyes BID   [MAR Hold] insulin  aspart  0-6 Units Subcutaneous TID WC   [MAR Hold] levothyroxine   175 mcg Oral Q0600   [MAR Hold] metoprolol  tartrate  25 mg Oral BID   ondansetron        [MAR Hold] pregabalin   50-100 mg Oral QHS   [MAR Hold] sodium chloride  flush  3 mL Intravenous Q12H   Continuous Infusions:  sodium chloride  10 mL/hr at 03/17/24 0355   sodium chloride      amiodarone  Stopped (03/15/24 1400)   famotidine      famotidine      heparin  Stopped (03/17/24 0806)   PRN Meds: sodium chloride , [MAR Hold] acetaminophen , [MAR Hold] albuterol , clopidogrel , famotidine , famotidine , fentaNYL , Heparin  (Porcine) in NaCl, heparin  sodium (porcine), hydrALAZINE , iohexol , lidocaine  (PF), [MAR Hold] melatonin, midazolam , ondansetron , [MAR Hold] ondansetron  (ZOFRAN ) IV, [MAR Hold] polyethylene glycol   Vital Signs    Vitals:   03/17/24 0914 03/17/24 0919 03/17/24 0945 03/17/24 1000  BP: (!) 141/61 131/60 139/65 (!) 169/71  Pulse: (!) 53 (!) 52 (!) 48 (!) 56  Resp: 19 19 19  (!) 28  Temp:      TempSrc:      SpO2: (!) 89% 92% 95% 91%  Weight:      Height:        Intake/Output Summary (Last 24 hours) at 03/17/2024 1018 Last data filed at 03/17/2024 0355 Gross  per 24 hour  Intake 10 ml  Output 2700 ml  Net -2690 ml   Filed Weights   03/16/24 1506 03/16/24 1514 03/17/24 0331  Weight: 114.2 kg (P) 114.2 kg 114.3 kg    Telemetry     - Personally Reviewed  ECG     - Personally Reviewed  Physical Exam     General: Comfortable, Head: Atraumatic, normal size  Eyes: PEERLA, EOMI  Neck: Supple, normal JVD Cardiac: Normal S1, S2; RRR; no murmurs, rubs, or gallops Lungs: Clear to auscultation bilaterally Abd: Soft, nontender, no hepatomegaly  Ext: warm, no edema Musculoskeletal: No deformities, BUE and BLE strength normal and equal Skin: Warm and dry, no rashes   Neuro: Alert and oriented to person, place, time, and situation, CNII-XII grossly intact, no focal deficits  Psych: Normal mood and affect   Labs    Chemistry Recent Labs  Lab 03/15/24 1658 03/16/24 0520 03/17/24 0449  NA 139 140 138  K 3.8 3.8 3.8  CL 101 102 98  CO2 18* 19* 22  GLUCOSE 113* 94 76  BUN 93* 106* 66*  CREATININE 10.01* 11.07* 7.89*  CALCIUM  7.5* 7.2* 7.7*  PROT 6.8  --   --   ALBUMIN  3.3* 2.8* 2.8*  AST 21  --   --   ALT 12  --   --   ALKPHOS 59  --   --   BILITOT 1.1  --   --   GFRNONAA 6* 5* 8*  ANIONGAP 20* 19* 18*     Hematology Recent Labs  Lab 03/15/24 1658 03/16/24 0520 03/17/24 0449  WBC 10.6* 6.1 5.2  RBC 2.82* 2.52* 2.93*  HGB 9.0* 8.0* 9.2*  HCT 26.2* 23.6* 27.2*  MCV 92.9 93.7 92.8  MCH 31.9 31.7 31.4  MCHC 34.4 33.9 33.8  RDW 13.1 13.3 13.0  PLT 139* 145* 172    Cardiac EnzymesNo results for input(s): TROPONINI in the last 168 hours. No results for input(s): TROPIPOC in the last 168 hours.   BNPNo results for input(s): BNP, PROBNP in the last 168 hours.   DDimer  Recent Labs  Lab 03/15/24 1658  DDIMER 1.25*     Radiology    CARDIAC CATHETERIZATION Result Date: 03/17/2024   1st Diag lesion is 70% stenosed.   Prox LAD to Mid LAD lesion is 50% stenosed.   Mid Cx lesion is 50% stenosed.   Prox RCA  lesion is 90% stenosed.   Mid RCA lesion is 50% stenosed.   A drug-eluting stent was successfully placed using a STENT ONYX FRONTIER 2.25X12.   Post intervention, there is a 0% residual stenosis.   LV end diastolic pressure is mildly elevated.   Recommend to resume Apixaban , at currently prescribed dose and frequency on 03/17/2024.   Recommend concurrent antiplatelet therapy of Aspirin  81 mg for 1 month and Clopidogrel  75mg  daily for 12 months . 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 ASA for one month. Plavix  for 12 months.   NM Pulmonary Perfusion Result Date: 03/16/2024 CLINICAL DATA:  Shortness of breath. EXAM: NUCLEAR MEDICINE PERFUSION LUNG SCAN TECHNIQUE: Perfusion images were obtained in multiple projections after intravenous injection of radiopharmaceutical. Ventilation scans intentionally deferred if perfusion scan and chest x-ray adequate for interpretation during COVID 19 epidemic. RADIOPHARMACEUTICALS:  4.0 mCi Tc-30m MAA IV COMPARISON:  Chest x-ray 03/16/2024 and older. Noncontrast CT 03/15/2024 FINDINGS: Defects noted at the bases consistent with the patient's enlarged heart and the pleural effusions. Few separate small subsegmental perfusion defects are identified. IMPRESSION: Conglomeration of findings consistent with low probability perfusion lung scan. Please correlate with the opacities and effusion seen at the recent CT and x-ray Electronically Signed   By: Ranell Bring M.D.   On: 03/16/2024 18:11   ECHOCARDIOGRAM COMPLETE Result Date: 03/16/2024    ECHOCARDIOGRAM REPORT   Patient Name:   Gregory Crane Date of Exam: 03/16/2024 Medical Rec #:  [id number]      Height:       72.0 in Accession #:    [id number]     Weight:       260.4 lb Date of Birth:  05/06/1971     BSA:          2.383 m Patient Age:    52 years       BP:           112/53 mmHg Patient Gender: M               HR:           57 bpm. Exam Location:  Inpatient Procedure: 2D Echo, Cardiac Doppler and Color Doppler (Both Spectral  and Color            Flow Doppler were utilized during procedure). Indications:    Pericaridal Effusion  History:        Patient has prior history of Echocardiogram examinations.                 Arrythmias:Atrial Fibrillation; Risk Factors:Hypertension,                 Diabetes and Dyslipidemia.  Sonographer:    CM Referring Phys: [id number] SYLVESTER I OGBATA IMPRESSIONS  1. Left ventricular ejection fraction, by estimation, is 60 to 65%. The left ventricle has normal function. The left ventricle has no regional wall motion abnormalities. There is moderate concentric left ventricular hypertrophy. Left ventricular diastolic parameters are consistent with Grade I diastolic dysfunction (impaired relaxation).  2. Right ventricular systolic function is normal. The right ventricular size is moderately enlarged.  3. The mitral valve is normal in structure. No evidence of mitral valve regurgitation. No evidence of mitral stenosis.  4. The aortic valve is tricuspid. There is mild calcification of the aortic valve. Aortic valve regurgitation is not visualized. Aortic valve sclerosis is present, with no evidence of aortic valve stenosis.  5. The inferior vena cava is normal in size with <50% respiratory variability, suggesting right atrial pressure of 8 mmHg. Comparison(s): No significant change from prior study. Prior images reviewed side by side. FINDINGS  Left Ventricle: Left ventricular ejection fraction, by estimation, is 60 to 65%. The left ventricle has normal function. The left ventricle has no regional wall motion abnormalities. The left ventricular internal cavity size was normal in size. There is  moderate concentric left ventricular hypertrophy. Left ventricular diastolic parameters are consistent with Grade I diastolic dysfunction (impaired relaxation). Right Ventricle: The right ventricular size is  moderately enlarged. No increase in right ventricular wall thickness. Right ventricular systolic function is normal. Left Atrium: Left atrial size was normal in size. Right Atrium: Right atrial size was normal in size. Pericardium: There is no evidence of pericardial effusion. Presence of epicardial fat layer. Mitral Valve: The mitral valve is normal in structure. No evidence of mitral valve regurgitation. No evidence of mitral valve stenosis. Tricuspid Valve: The tricuspid valve is normal in structure. Tricuspid valve regurgitation is not demonstrated. No evidence of tricuspid stenosis. Aortic Valve: The aortic valve is tricuspid. There is mild calcification of the aortic valve. Aortic valve regurgitation is not visualized. Aortic valve sclerosis is present, with no evidence of aortic valve stenosis. Aortic valve mean gradient measures 5.0 mmHg. Aortic valve peak gradient measures 10.2 mmHg. Aortic valve area, by VTI measures 2.55 cm. Pulmonic Valve: The pulmonic valve was normal in structure. Pulmonic valve regurgitation is trivial. No evidence of pulmonic stenosis. Aorta: The aortic root and ascending aorta are structurally normal, with no evidence of dilitation. Venous: The inferior vena cava is normal in size with less than 50% respiratory variability, suggesting right atrial pressure of 8 mmHg. IAS/Shunts: The atrial septum is grossly normal.  LEFT VENTRICLE PLAX 2D LVIDd:         5.40 cm   Diastology LVIDs:         3.60 cm   LV e' medial:    8.49 cm/s LV PW:         1.20 cm   LV E/e' medial:  7.3 LV IVS:        1.40 cm   LV e' lateral:   6.53 cm/s LVOT diam:  2.30 cm   LV E/e' lateral: 9.4 LV SV:         111 LV SV Index:   46 LVOT Area:     4.15 cm  RIGHT VENTRICLE            IVC RV S prime:     8.16 cm/s  IVC diam: 2.70 cm TAPSE (M-mode): 2.2 cm AORTIC VALVE AV Area (Vmax):    2.73 cm AV Area (Vmean):   2.62 cm AV Area (VTI):     2.55 cm AV Vmax:           160.00 cm/s AV Vmean:          111.000 cm/s  AV VTI:            0.434 m AV Peak Grad:      10.2 mmHg AV Mean Grad:      5.0 mmHg LVOT Vmax:         105.00 cm/s LVOT Vmean:        70.100 cm/s LVOT VTI:          0.266 m LVOT/AV VTI ratio: 0.61  AORTA Ao Root diam: 3.50 cm Ao Asc diam:  3.90 cm MITRAL VALVE MV Area (PHT): 3.54 cm    SHUNTS MV Decel Time: 214 msec    Systemic VTI:  0.27 m MV E velocity: 61.60 cm/s  Systemic Diam: 2.30 cm MV A velocity: 59.60 cm/s MV E/A ratio:  1.03 Stanly Leavens MD Electronically signed by Stanly Leavens MD Signature Date/Time: 03/16/2024/10:06:19 AM    Final    DG Chest Port 1 View Result Date: 03/16/2024 CLINICAL DATA:  Shortness of breath. EXAM: PORTABLE CHEST 1 VIEW COMPARISON:  03/15/2024 FINDINGS: Low volume film. The cardio pericardial silhouette is enlarged. Vascular congestion with diffuse interstitial opacity suggests edema. Tiny pleural effusions. Telemetry leads overlie the chest. IMPRESSION: Low volume film with vascular congestion and diffuse interstitial opacity suggesting edema. Electronically Signed   By: Camellia Candle M.D.   On: 03/16/2024 07:04   CT Chest Wo Contrast Result Date: 03/15/2024 CLINICAL DATA:  Dyspnea, chronic, central airway disease suspected EXAM: CT CHEST WITHOUT CONTRAST TECHNIQUE: Multidetector CT imaging of the chest was performed following the standard protocol without IV contrast. RADIATION DOSE REDUCTION: This exam was performed according to the departmental dose-optimization program which includes automated exposure control, adjustment of the mA and/or kV according to patient size and/or use of iterative reconstruction technique. COMPARISON:  Radiograph earlier today.  Chest CT 12/24/2023 FINDINGS: Cardiovascular: The heart is enlarged. There are coronary artery calcifications. No pericardial effusion. Aortic atherosclerosis. No aortic aneurysm. Main pulmonary artery is dilated at 3.8 cm. Mediastinum/Nodes: Shotty mediastinal lymphadenopathy, largest lymph node measures  9 mm short axis series 2, image 60. Hilar assessment is limited in the absence of IV contrast. Patulous esophagus without wall thickening. Lungs/Pleura: Small bilateral pleural effusions, right greater than left. These have slightly diminished since April exam. Associated compressive atelectasis in the lower lobes. Multifocal ground-glass and nodular opacities throughout both lungs with areas of septal thickening. Bowing of the distal trachea and central airways. Mild central bronchial thickening. Upper Abdomen: Cholecystectomy. No acute upper abdominal findings or free fluid. Musculoskeletal: Flowing anterior osteophytes throughout the thoracic spine. There are no acute or suspicious osseous abnormalities. IMPRESSION: 1. Multifocal ground-glass and nodular opacities throughout both lungs with areas of septal thickening. Findings may represent pulmonary edema or atypical infection. 2. Small bilateral pleural effusions, right greater than left. These have slightly diminished since  April exam. 3. Cardiomegaly with coronary artery calcifications. 4. Dilated main pulmonary artery, can be seen with pulmonary arterial hypertension. 5. Shotty mediastinal lymphadenopathy, likely reactive. 6. 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 Aortic Atherosclerosis (ICD10-I70.0). Electronically Signed   By: Andrea Gasman M.D.   On: 03/15/2024 18:06   DG CHEST PORT 1 VIEW Result Date: 03/15/2024 CLINICAL DATA:  Difficulty breathing, low O2 sats, tachycardia. EXAM: PORTABLE CHEST 1 VIEW COMPARISON:  03/14/2024 and CT chest 12/24/2023. FINDINGS: Trachea is midline. Heart is enlarged. Diffuse mixed interstitial and airspace opacification appears progressive from 03/14/2024. Tiny bilateral pleural effusions. IMPRESSION: Coarsening congestive heart failure. Electronically Signed   By: Newell Eke M.D.   On: 03/15/2024 15:02    Cardiac Studies   Catheterization,  echocardiogram  Patient Profile     53 y.o. male with NSTEMI  Assessment & Plan    Coronary artery disease status post PCI to the RCA in the setting of recent NSTEMI Hypertension Hyperlipidemia ESRD  Status post left heart catheterization with PCI to the RCA.  He will be on dual antiplatelet therapy with aspirin  and Plavix . Continue high-dose Lipitor  40 mg daily.  HD as per scheduled  Blood pressure with 1 elevated reading if this continues we will continue will optimize his antihypertensive medications.     For questions or updates, please contact [facility] Please consult [email or web address] for contact info under Cardiology/STEMI.      Signed, Motty Borin, DO  03/17/2024, 10:18 AM

## 2024-03-17 NOTE — Progress Notes (Signed)
 [facility] Progress Note   Subjective:    Seen and examined patient at bedside. Tolerated yesterday's HD with net UF 2.5L. S/p LHC with PCI to RCA today. Noted he's now on DAPT. He's now on RA and denies SOB, CP, and N/V. Next HD 7/24. Hopeful he can go home soon.  Objective Vitals:   03/17/24 1315 03/17/24 1345 03/17/24 1400 03/17/24 1425  BP: (!) 146/64 102/82 (!) 129/58 133/64  Pulse: (!) 51 (!) 54 (!) 50 (!) 50  Resp: 16 14 15 14   Temp:    (!) 97.5 F (36.4 C)  TempSrc:    Oral  SpO2: 98% 94% 98% 99%  Weight:      Height:       Physical Exam General: Awake, alert, now on RA, NAD Heart: S1 and S2; No murmurs, gallops, or rubs Lungs: Clear anteriorly; No rales or wheezing Abdomen: Soft and non-tender Extremities: No LE edema Dialysis Access: L AVF (+) B/T   Filed Weights   03/16/24 1506 03/16/24 1514 03/17/24 0331  Weight: 114.2 kg (P) 114.2 kg 114.3 kg    Intake/Output Summary (Last 24 hours) at 03/17/2024 1639 Last data filed at 03/17/2024 1504 Gross per 24 hour  Intake 747.51 ml  Output 200 ml  Net 547.51 ml    Additional Objective Labs: Basic Metabolic Panel: Recent Labs  Lab 03/15/24 1658 03/16/24 0520 03/17/24 0449  NA 139 140 138  K 3.8 3.8 3.8  CL 101 102 98  CO2 18* 19* 22  GLUCOSE 113* 94 76  BUN 93* 106* 66*  CREATININE 10.01* 11.07* 7.89*  CALCIUM  7.5* 7.2* 7.7*  PHOS 6.4* 10.3* 7.9*   Liver Function Tests: Recent Labs  Lab 03/15/24 1658 03/16/24 0520 03/17/24 0449  AST 21  --   --   ALT 12  --   --   ALKPHOS 59  --   --   BILITOT 1.1  --   --   PROT 6.8  --   --   ALBUMIN  3.3* 2.8* 2.8*   No results for input(s): LIPASE, AMYLASE in the last 168 hours. CBC: Recent Labs  Lab 03/14/24 2029 03/14/24 2210 03/15/24 0529 03/15/24 1519 03/15/24 1658 03/16/24 0520 03/17/24 0449  WBC 10.6*  --  10.8*  --  10.6* 6.1 5.2  NEUTROABS  --   --   --   --  9.7* 4.8 3.9  HGB 10.2*   < > 9.6*   < > 9.0* 8.0* 9.2*   HCT 30.7*   < > 29.1*   < > 26.2* 23.6* 27.2*  MCV 94.8  --  96.0  --  92.9 93.7 92.8  PLT 160  --  151  --  139* 145* 172   < > = values in this interval not displayed.   Blood Culture    Component Value Date/Time   SDES BLOOD RIGHT [facility] 12/23/2023 1025   SPECREQUEST  12/23/2023 1025    BOTTLES DRAWN AEROBIC AND ANAEROBIC Blood Culture adequate volume   CULT  12/23/2023 1025    NO GROWTH 5 DAYS Performed at [facility], [address] [place], [place] [address]    REPTSTATUS 12/28/2023 FINAL 12/23/2023 1025    Cardiac Enzymes: No results for input(s): CKTOTAL, CKMB, CKMBINDEX, TROPONINI in the last 168 hours. CBG: Recent Labs  Lab 03/15/24 2043 03/16/24 0752 03/16/24 1631 03/16/24 2104 03/17/24 1102  GLUCAP 108* 89 73 83 71   Iron Studies: No results for input(s): IRON, TIBC, TRANSFERRIN,  FERRITIN in the last 72 hours. Lab Results  Component Value Date   INR 1.3 (H) 08/05/2023   INR 1.0 05/04/2022   INR 0.85 04/09/2018   Studies/Results: DG CHEST PORT 1 VIEW Result Date: 03/17/2024 CLINICAL DATA:  Myocardial infarction. EXAM: PORTABLE CHEST 1 VIEW COMPARISON:  03/16/2024 FINDINGS: Mild enlargement of the cardiopericardial silhouette and indistinct pulmonary vasculature noted but previous edema has mostly resolved. There may be some faint residual retrocardiac edema in the left lower lobe. No blunting of the costophrenic angles. Atherosclerotic calcification of the aortic arch. Thoracic spondylosis. IMPRESSION: 1. Mild enlargement of the cardiopericardial silhouette and indistinct pulmonary vasculature, but previous edema has mostly resolved. There may be some faint residual retrocardiac edema in the left lower lobe. 2. Aortic Atherosclerosis (ICD10-I70.0). Electronically Signed   By: [name] Salvage M.D.   On: 03/17/2024 10:33   CARDIAC CATHETERIZATION Result Date: 03/17/2024   1st Diag lesion is 70% stenosed.   Prox LAD to Mid LAD lesion is  50% stenosed.   Mid Cx lesion is 50% stenosed.   Prox RCA lesion is 90% stenosed.   Mid RCA lesion is 50% stenosed.   A drug-eluting stent was successfully placed using a STENT ONYX FRONTIER 2.25X12.   Post intervention, there is a 0% residual stenosis.   LV end diastolic pressure is mildly elevated.   Recommend to resume Apixaban , at currently prescribed dose and frequency on 03/17/2024.   Recommend concurrent antiplatelet therapy of Aspirin  81 mg for 1 month and Clopidogrel  75mg  daily for 12 months . 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 ASA for one month. Plavix  for 12 months.   NM Pulmonary Perfusion Result Date: 03/16/2024 CLINICAL DATA:  Shortness of breath. EXAM: NUCLEAR MEDICINE PERFUSION LUNG SCAN TECHNIQUE: Perfusion images were obtained in multiple projections after intravenous injection of radiopharmaceutical. Ventilation scans intentionally deferred if perfusion scan and chest x-ray adequate for interpretation during COVID 19 epidemic. RADIOPHARMACEUTICALS:  4.0 mCi Tc-46m MAA IV COMPARISON:  Chest x-ray 03/16/2024 and older. Noncontrast CT 03/15/2024 FINDINGS: Defects noted at the bases consistent with the patient's enlarged heart and the pleural effusions. Few separate small subsegmental perfusion defects are identified. IMPRESSION: Conglomeration of findings consistent with low probability perfusion lung scan. Please correlate with the opacities and effusion seen at the recent CT and x-ray Electronically Signed   By: Ranell Bring M.D.   On: 03/16/2024 18:11   ECHOCARDIOGRAM COMPLETE Result Date: 03/16/2024    ECHOCARDIOGRAM REPORT   Patient Name:   [facility] Date of Exam: 03/16/2024 Medical Rec #:  [id number]      Height:       72.0 in Accession #:    [id number]     Weight:       260.4 lb Date of Birth:  11-18-1970     BSA:          2.383 m Patient Age:    52 years        BP:           112/53 mmHg Patient Gender: M              HR:           57 bpm. Exam Location:  Inpatient Procedure: 2D Echo, Cardiac Doppler and Color Doppler (Both Spectral and Color            Flow Doppler  were utilized during procedure). Indications:    Pericaridal Effusion  History:        Patient has prior history of Echocardiogram examinations.                 Arrythmias:Atrial Fibrillation; Risk Factors:Hypertension,                 Diabetes and Dyslipidemia.  Sonographer:    CM Referring Phys: [id number] SYLVESTER I OGBATA IMPRESSIONS  1. Left ventricular ejection fraction, by estimation, is 60 to 65%. The left ventricle has normal function. The left ventricle has no regional wall motion abnormalities. There is moderate concentric left ventricular hypertrophy. Left ventricular diastolic parameters are consistent with Grade I diastolic dysfunction (impaired relaxation).  2. Right ventricular systolic function is normal. The right ventricular size is moderately enlarged.  3. The mitral valve is normal in structure. No evidence of mitral valve regurgitation. No evidence of mitral stenosis.  4. The aortic valve is tricuspid. There is mild calcification of the aortic valve. Aortic valve regurgitation is not visualized. Aortic valve sclerosis is present, with no evidence of aortic valve stenosis.  5. The inferior vena cava is normal in size with <50% respiratory variability, suggesting right atrial pressure of 8 mmHg. Comparison(s): No significant change from prior study. Prior images reviewed side by side. FINDINGS  Left Ventricle: Left ventricular ejection fraction, by estimation, is 60 to 65%. The left ventricle has normal function. The left ventricle has no regional wall motion abnormalities. The left ventricular internal cavity size was normal in size. There is  moderate concentric left ventricular hypertrophy. Left ventricular diastolic parameters are consistent with Grade I diastolic dysfunction (impaired  relaxation). Right Ventricle: The right ventricular size is moderately enlarged. No increase in right ventricular wall thickness. Right ventricular systolic function is normal. Left Atrium: Left atrial size was normal in size. Right Atrium: Right atrial size was normal in size. Pericardium: There is no evidence of pericardial effusion. Presence of epicardial fat layer. Mitral Valve: The mitral valve is normal in structure. No evidence of mitral valve regurgitation. No evidence of mitral valve stenosis. Tricuspid Valve: The tricuspid valve is normal in structure. Tricuspid valve regurgitation is not demonstrated. No evidence of tricuspid stenosis. Aortic Valve: The aortic valve is tricuspid. There is mild calcification of the aortic valve. Aortic valve regurgitation is not visualized. Aortic valve sclerosis is present, with no evidence of aortic valve stenosis. Aortic valve mean gradient measures 5.0 mmHg. Aortic valve peak gradient measures 10.2 mmHg. Aortic valve area, by VTI measures 2.55 cm. Pulmonic Valve: The pulmonic valve was normal in structure. Pulmonic valve regurgitation is trivial. No evidence of pulmonic stenosis. Aorta: The aortic root and ascending aorta are structurally normal, with no evidence of dilitation. Venous: The inferior vena cava is normal in size with less than 50% respiratory variability, suggesting right atrial pressure of 8 mmHg. IAS/Shunts: The atrial septum is grossly normal.  LEFT VENTRICLE PLAX 2D LVIDd:         5.40 cm   Diastology LVIDs:         3.60 cm   LV e' medial:    8.49 cm/s LV PW:         1.20 cm   LV E/e' medial:  7.3 LV IVS:        1.40 cm   LV e' lateral:   6.53 cm/s LVOT diam:     2.30 cm   LV E/e' lateral: 9.4 LV SV:  111 LV SV Index:   46 LVOT Area:     4.15 cm  RIGHT VENTRICLE            IVC RV S prime:     8.16 cm/s  IVC diam: 2.70 cm TAPSE (M-mode): 2.2 cm AORTIC VALVE AV Area (Vmax):    2.73 cm AV Area (Vmean):   2.62 cm AV Area (VTI):     2.55 cm AV  Vmax:           160.00 cm/s AV Vmean:          111.000 cm/s AV VTI:            0.434 m AV Peak Grad:      10.2 mmHg AV Mean Grad:      5.0 mmHg LVOT Vmax:         105.00 cm/s LVOT Vmean:        70.100 cm/s LVOT VTI:          0.266 m LVOT/AV VTI ratio: 0.61  AORTA Ao Root diam: 3.50 cm Ao Asc diam:  3.90 cm MITRAL VALVE MV Area (PHT): 3.54 cm    SHUNTS MV Decel Time: 214 msec    Systemic VTI:  0.27 m MV E velocity: 61.60 cm/s  Systemic Diam: 2.30 cm MV A velocity: 59.60 cm/s MV E/A ratio:  1.03 Stanly Leavens MD Electronically signed by Stanly Leavens MD Signature Date/Time: 03/16/2024/10:06:19 AM    Final    DG Chest Port 1 View Result Date: 03/16/2024 CLINICAL DATA:  Shortness of breath. EXAM: PORTABLE CHEST 1 VIEW COMPARISON:  03/15/2024 FINDINGS: Low volume film. The cardio pericardial silhouette is enlarged. Vascular congestion with diffuse interstitial opacity suggests edema. Tiny pleural effusions. Telemetry leads overlie the chest. IMPRESSION: Low volume film with vascular congestion and diffuse interstitial opacity suggesting edema. Electronically Signed   By: Camellia Candle M.D.   On: 03/16/2024 07:04   CT Chest Wo Contrast Result Date: 03/15/2024 CLINICAL DATA:  Dyspnea, chronic, central airway disease suspected EXAM: CT CHEST WITHOUT CONTRAST TECHNIQUE: Multidetector CT imaging of the chest was performed following the standard protocol without IV contrast. RADIATION DOSE REDUCTION: This exam was performed according to the departmental dose-optimization program which includes automated exposure control, adjustment of the mA and/or kV according to patient size and/or use of iterative reconstruction technique. COMPARISON:  Radiograph earlier today.  Chest CT 12/24/2023 FINDINGS: Cardiovascular: The heart is enlarged. There are coronary artery calcifications. No pericardial effusion. Aortic atherosclerosis. No aortic aneurysm. Main pulmonary artery is dilated at 3.8 cm. Mediastinum/Nodes:  Shotty mediastinal lymphadenopathy, largest lymph node measures 9 mm short axis series 2, image 60. Hilar assessment is limited in the absence of IV contrast. Patulous esophagus without wall thickening. Lungs/Pleura: Small bilateral pleural effusions, right greater than left. These have slightly diminished since April exam. Associated compressive atelectasis in the lower lobes. Multifocal ground-glass and nodular opacities throughout both lungs with areas of septal thickening. Bowing of the distal trachea and central airways. Mild central bronchial thickening. Upper Abdomen: Cholecystectomy. No acute upper abdominal findings or free fluid. Musculoskeletal: Flowing anterior osteophytes throughout the thoracic spine. There are no acute or suspicious osseous abnormalities. IMPRESSION: 1. Multifocal ground-glass and nodular opacities throughout both lungs with areas of septal thickening. Findings may represent pulmonary edema or atypical infection. 2. Small bilateral pleural effusions, right greater than left. These have slightly diminished since April exam. 3. Cardiomegaly with coronary artery calcifications. 4. Dilated main pulmonary artery, can be seen with pulmonary  arterial hypertension. 5. Shotty mediastinal lymphadenopathy, likely reactive. 6. 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 Aortic Atherosclerosis (ICD10-I70.0). Electronically Signed   By: Andrea Gasman M.D.   On: 03/15/2024 18:06    Medications:  sodium chloride  Stopped (03/17/24 1450)   amiodarone  Stopped (03/15/24 1400)    amiodarone   200 mg Oral BID   apixaban   5 mg Oral BID   aspirin  EC  81 mg Oral Daily   atorvastatin   40 mg Oral Daily   Chlorhexidine  Gluconate Cloth  6 each Topical Q0600   [START ON 03/18/2024] clopidogrel   75 mg Oral Q breakfast   cyanocobalamin   1,000 mcg Subcutaneous Daily   [START ON 03/19/2024] vitamin B-12  1,000 mcg Oral Daily   dorzolamide -timolol   1 drop Both  Eyes BID   insulin  aspart  0-6 Units Subcutaneous TID WC   levothyroxine   175 mcg Oral Q0600   metoprolol  tartrate  25 mg Oral BID   pregabalin   50-100 mg Oral QHS   sodium chloride  flush  3 mL Intravenous Q12H   sodium chloride  flush  3 mL Intravenous Q12H    Dialysis Orders: [facility] [phone] Records requested 7/22    Assessment/Plan: Respiratory distress: Shortness of breath after missing 1 week of dialysis. Per his outpatient HD center, his last HD was on 03/04/24. On Bipap at admit. Received HD 7/21 but cramped shortly after treatment was started. 1.9L removed. Recent Chest CT and CXR suggest ongoing vascular congestion and edema so received HD yesterday and 2.5L removed. He's feeling better and now on RA.  Elevated toponins/A-flutter - Cardiology consulted. S/p LHC with PCI to RCA today. Now on DAPT. On statin. ESRD: on HD TTS. Next HD 7/24 per his usual schedule. Will set UFG 2-2.5L. Will plan for 1st shift in case he goes home tomorrow. HTN: Resume blood pressure lowering meds after dialysis Volume: Improving after HD.  Anemia of esrd: Hgb 9-11.,  Follow-up Dispo - Spoke to the NP at patient's outpatient HD center ([facility]) yesterday. His last HD there was on 03/04/24. Per the NP, patient has been refusing to come to his treatments. He feels HD is affecting his overall quality of life, feeling drained all the time. Was on PD in the past but didn't do well 2nd peritonitis. I had a long discussion with him and sister at bedside today on the importance of coming to his HD treatments as prescribed to prevent multiple admits. Also discussed fluid restriction and following low-Na diet. Per Renal Navigator, patient can return back to his outpatient HD clinic. Hopeful he can go home soon. Okay for discharge from a renal standpoint.  Charmaine Piety, NP [facility] 03/17/2024,4:39 PM  LOS: 3 days

## 2024-03-17 NOTE — Telephone Encounter (Signed)
 Pt is currently admitted at [facility]. Please see other telephone encounter

## 2024-03-17 NOTE — Progress Notes (Signed)
 PROGRESS NOTE    Gregory Crane  FMW:[id number] DOB: [birth year] DOA: 03/14/2024 PCP: Glendia Shad, MD  Chief Complaint  Patient presents with   Shortness of Breath    Brief Narrative:   53 year old male, morbidly obese, with past medical history significant for end-stage renal disease on hemodialysis TTS, hypertension, diabetes mellitus, atrial fibrillation on anticoagulation, prior CVA, hyperlipidemia and hypothyroidism who presented with acute hypoxic respiratory failure in the setting of volume overload in the setting of missed dialysis sessions.  Also was noted to have significantly elevated troponins, now s/p LHC with PCI to mid RCA with DES.    Cardiology and renal following.    Assessment & Plan:   Principal Problem:   Hypervolemia Active Problems:   Atrial fibrillation with rapid ventricular response (HCC)   Hypoxic respiratory failure (HCC)   Demand ischemia (HCC)   Non-ST elevation (NSTEMI) myocardial infarction [facility])   CAD S/P percutaneous coronary angioplasty  Acute hypoxic respiratory failure Volume overload ESRD In setting of missed dialysis session Required bipap initially Now s/p HD 7/21 and 7/22 Appreciate renal assistance  NSTEMI In setting of missing dialysis Trop peaked to ~7307 Now s/p LHC with single vessel obstructive CAD involving the mid RCA, successful PCI of mid RCA with DES Plan for eliquis  this evening.  ASA x1 month.  Plavix  x 12 months.    Afib/flutter with rapid ventricular response  Appreciate cardiology assistance - currently on amiodarone , metoprolol  (diltiazem  d/c'd with low BP interfering with dialysis) - eliquis     Hx CVA:  Eliquis   not on cholesterol statin (LDL 09/2023 was 66)   HTN: Diltiazem  on hold, lasix  on hold, hydralazine  on hold Currently only on metop    Hypothyroidism: Continue home Levothyroxine  Needs repeat TSH outside of acute illness, mildly elevated on 7/21 - follow outpatient   DM2   A1c 11/2022 6.2 SSI    Needs evaluation for OSA outpatient   Obesity Body mass index is 34.18 kg/m.    DVT prophylaxis: eliquis  Code Status: full Family Communication: none Disposition:   Status is: Inpatient Remains inpatient appropriate because: need for continued inpatient care   Consultants:  Cards renal  Procedures:  LHC   1st Diag lesion is 70% stenosed.   Prox LAD to Mid LAD lesion is 50% stenosed.   Mid Cx lesion is 50% stenosed.   Prox RCA lesion is 90% stenosed.   Mid RCA lesion is 50% stenosed.   Gregory Crane drug-eluting stent was successfully placed using [employer] STENT ONYX FRONTIER 2.25X12.   Post intervention, there is Gregory Crane 0% residual stenosis.   LV end diastolic pressure is mildly elevated.   Recommend to resume Apixaban , at currently prescribed dose and frequency on 03/17/2024.   Recommend concurrent antiplatelet therapy of Aspirin  81 mg for 1 month and Clopidogrel  75mg  daily for 12 months .   Single vessel obstructive CAD involving the mid RCA Mildly elevated LVEDP 17 mm Hg Successful PCI of the mid RCA with DES   Plan: may resume Eliquis  this evening. ASA for one month. Plavix  for 12 months.  Echo IMPRESSIONS     1. Left ventricular ejection fraction, by estimation, is 60 to 65%. The  left ventricle has normal function. The left ventricle has no regional  wall motion abnormalities. There is moderate concentric left ventricular  hypertrophy. Left ventricular  diastolic parameters are consistent with Grade I diastolic dysfunction  (impaired relaxation).   2. Right ventricular systolic function is normal. The right ventricular  size is moderately enlarged.  3. The mitral valve is normal in structure. No evidence of mitral valve  regurgitation. No evidence of mitral stenosis.   4. The aortic valve is tricuspid. There is mild calcification of the  aortic valve. Aortic valve regurgitation is not visualized. Aortic valve  sclerosis is present, with no evidence of aortic valve stenosis.    5. The inferior vena cava is normal in size with <50% respiratory  variability, suggesting right atrial pressure of 8 mmHg.   Comparison(s): No significant change from prior study. Prior images  reviewed side by side.   Antimicrobials:  Anti-infectives (From admission, onward)    None       Subjective: No complaints this afternoon post procedure  Objective: Vitals:   03/17/24 1345 03/17/24 1400 03/17/24 1425 03/17/24 1652  BP: 102/82 (!) 129/58 133/64 (!) 140/57  Pulse: (!) 54 (!) 50 (!) 50 (!) 50  Resp: 14 15 14 16   Temp:   (!) 97.5 F (36.4 C) 97.7 F (36.5 C)  TempSrc:   Oral Oral  SpO2: 94% 98% 99% 99%  Weight:      Height:        Intake/Output Summary (Last 24 hours) at 03/17/2024 1824 Last data filed at 03/17/2024 1504 Gross per 24 hour  Intake 747.51 ml  Output 200 ml  Net 547.51 ml   Filed Weights   03/16/24 1506 03/16/24 1514 03/17/24 0331  Weight: 114.2 kg (P) 114.2 kg 114.3 kg    Examination:  General exam: Appears calm and comfortable  Respiratory system: unlabored Cardiovascular system: RRR, sinus brady on tele Gastrointestinal system: Abdomen is nondistended, soft and nontender.  Central nervous system: Alert and oriented. No focal neurological deficits. Extremities: no LEE   Data Reviewed: I have personally reviewed following labs and imaging studies  CBC: Recent Labs  Lab 03/14/24 2029 03/14/24 2210 03/15/24 0529 03/15/24 1519 03/15/24 1658 03/16/24 0520 03/17/24 0449  WBC 10.6*  --  10.8*  --  10.6* 6.1 5.2  NEUTROABS  --   --   --   --  9.7* 4.8 3.9  HGB 10.2*   < > 9.6* 10.5* 9.0* 8.0* 9.2*  HCT 30.7*   < > 29.1* 31.0* 26.2* 23.6* 27.2*  MCV 94.8  --  96.0  --  92.9 93.7 92.8  PLT 160  --  151  --  139* 145* 172   < > = values in this interval not displayed.    Basic Metabolic Panel: Recent Labs  Lab 03/14/24 2029 03/14/24 2210 03/15/24 0529 03/15/24 1519 03/15/24 1658 03/16/24 0520 03/17/24 0449  NA 142   < >  142 139 139 140 138  K 4.7   < > 4.9 3.6 3.8 3.8 3.8  CL 106  --  107  --  101 102 98  CO2 12*  --  12*  --  18* 19* 22  GLUCOSE 147*  --  160*  --  113* 94 76  BUN 154*  --  158*  --  93* 106* 66*  CREATININE 14.73*  --  14.62*  --  10.01* 11.07* 7.89*  CALCIUM  7.0*  --  7.0*  --  7.5* 7.2* 7.7*  MG 2.6*  --  2.6*  --   --  2.2  --   PHOS  --   --  >30.0*  --  6.4* 10.3* 7.9*   < > = values in this interval not displayed.    GFR: Estimated Creatinine Clearance: 14.1 mL/min (Gregory Crane) (by C-G formula  based on SCr of 7.89 mg/dL (H)).  Liver Function Tests: Recent Labs  Lab 03/15/24 1658 03/16/24 0520 03/17/24 0449  AST 21  --   --   ALT 12  --   --   ALKPHOS 59  --   --   BILITOT 1.1  --   --   PROT 6.8  --   --   ALBUMIN  3.3* 2.8* 2.8*    CBG: Recent Labs  Lab 03/16/24 0752 03/16/24 1631 03/16/24 2104 03/17/24 1102 03/17/24 1654  GLUCAP 89 73 83 71 94     No results found for this or any previous visit (from the past 240 hours).       Radiology Studies: DG CHEST PORT 1 VIEW Result Date: 03/17/2024 CLINICAL DATA:  Myocardial infarction. EXAM: PORTABLE CHEST 1 VIEW COMPARISON:  03/16/2024 FINDINGS: Mild enlargement of the cardiopericardial silhouette and indistinct pulmonary vasculature noted but previous edema has mostly resolved. There may be some faint residual retrocardiac edema in the left lower lobe. No blunting of the costophrenic angles. Atherosclerotic calcification of the aortic arch. Thoracic spondylosis. IMPRESSION: 1. Mild enlargement of the cardiopericardial silhouette and indistinct pulmonary vasculature, but previous edema has mostly resolved. There may be some faint residual retrocardiac edema in the left lower lobe. 2. Aortic Atherosclerosis (ICD10-I70.0). Electronically Signed   By: [name] Salvage M.D.   On: 03/17/2024 10:33   CARDIAC CATHETERIZATION Result Date: 03/17/2024   1st Diag lesion is 70% stenosed.   Prox LAD to Mid LAD lesion is 50%  stenosed.   Mid Cx lesion is 50% stenosed.   Prox RCA lesion is 90% stenosed.   Mid RCA lesion is 50% stenosed.   Gregory Crane drug-eluting stent was successfully placed using [employer]ania Prothe[employer] STENT ONYX FRONTIER 2.25X12.   Post intervention, there is Gregory Crane 0% residual stenosis.   LV end diastolic pressure is mildly elevated.   Recommend to resume Apixaban , at currently prescribed dose and frequency on 03/17/2024.   Recommend concurrent antiplatelet therapy of Aspirin  81 mg for 1 month and Clopidogrel  75mg  daily for 12 months . 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 ASA for one month. Plavix  for 12 months.   NM Pulmonary Perfusion Result Date: 03/16/2024 CLINICAL DATA:  Shortness of breath. EXAM: NUCLEAR MEDICINE PERFUSION LUNG SCAN TECHNIQUE: Perfusion images were obtained in multiple projections after intravenous injection of radiopharmaceutical. Ventilation scans intentionally deferred if perfusion scan and chest x-ray adequate for interpretation during COVID 19 epidemic. RADIOPHARMACEUTICALS:  4.0 mCi Tc-45m MAA IV COMPARISON:  Chest x-ray 03/16/2024 and older. Noncontrast CT 03/15/2024 FINDINGS: Defects noted at the bases consistent with the patient's enlarged heart and the pleural effusions. Few separate small subsegmental perfusion defects are identified. IMPRESSION: Conglomeration of findings consistent with low probability perfusion lung scan. Please correlate with the opacities and effusion seen at the recent CT and x-ray Electronically Signed   By: Ranell Bring M.D.   On: 03/16/2024 18:11   ECHOCARDIOGRAM COMPLETE Result Date: 03/16/2024    ECHOCARDIOGRAM REPORT   Patient Name:   Gregory Crane Date of Exam: 03/16/2024 Medical Rec #:  [id number]      Height:       72.0 in Accession #:    [id number]     Weight:       260.4 lb Date of Birth:  [birth year]     BSA:  2.383 m Patient Age:    52 years       BP:            112/53 mmHg Patient Gender: M              HR:           57 bpm. Exam Location:  Inpatient Procedure: 2D Echo, Cardiac Doppler and Color Doppler (Both Spectral and Color            Flow Doppler were utilized during procedure). Indications:    Pericaridal Effusion  History:        Patient has prior history of Echocardiogram examinations.                 Arrythmias:Atrial Fibrillation; Risk Factors:Hypertension,                 Diabetes and Dyslipidemia.  Sonographer:    CM Referring Phys: [id number] SYLVESTER I OGBATA IMPRESSIONS  1. Left ventricular ejection fraction, by estimation, is 60 to 65%. The left ventricle has normal function. The left ventricle has no regional wall motion abnormalities. There is moderate concentric left ventricular hypertrophy. Left ventricular diastolic parameters are consistent with Grade I diastolic dysfunction (impaired relaxation).  2. Right ventricular systolic function is normal. The right ventricular size is moderately enlarged.  3. The mitral valve is normal in structure. No evidence of mitral valve regurgitation. No evidence of mitral stenosis.  4. The aortic valve is tricuspid. There is mild calcification of the aortic valve. Aortic valve regurgitation is not visualized. Aortic valve sclerosis is present, with no evidence of aortic valve stenosis.  5. The inferior vena cava is normal in size with <50% respiratory variability, suggesting right atrial pressure of 8 mmHg. Comparison(s): No significant change from prior study. Prior images reviewed side by side. FINDINGS  Left Ventricle: Left ventricular ejection fraction, by estimation, is 60 to 65%. The left ventricle has normal function. The left ventricle has no regional wall motion abnormalities. The left ventricular internal cavity size was normal in size. There is  moderate concentric left ventricular hypertrophy. Left ventricular diastolic parameters are consistent with Grade I diastolic dysfunction (impaired  relaxation). Right Ventricle: The right ventricular size is moderately enlarged. No increase in right ventricular wall thickness. Right ventricular systolic function is normal. Left Atrium: Left atrial size was normal in size. Right Atrium: Right atrial size was normal in size. Pericardium: There is no evidence of pericardial effusion. Presence of epicardial fat layer. Mitral Valve: The mitral valve is normal in structure. No evidence of mitral valve regurgitation. No evidence of mitral valve stenosis. Tricuspid Valve: The tricuspid valve is normal in structure. Tricuspid valve regurgitation is not demonstrated. No evidence of tricuspid stenosis. Aortic Valve: The aortic valve is tricuspid. There is mild calcification of the aortic valve. Aortic valve regurgitation is not visualized. Aortic valve sclerosis is present, with no evidence of aortic valve stenosis. Aortic valve mean gradient measures 5.0 mmHg. Aortic valve peak gradient measures 10.2 mmHg. Aortic valve area, by VTI measures 2.55 cm. Pulmonic Valve: The pulmonic valve was normal in structure. Pulmonic valve regurgitation is trivial. No evidence of pulmonic stenosis. Aorta: The aortic root and ascending aorta are structurally normal, with no evidence of dilitation. Venous: The inferior vena cava is normal in size with less than 50% respiratory variability, suggesting right atrial pressure of 8 mmHg. IAS/Shunts: The atrial septum is grossly normal.  LEFT VENTRICLE PLAX 2D LVIDd:  5.40 cm   Diastology LVIDs:         3.60 cm   LV e' medial:    8.49 cm/s LV PW:         1.20 cm   LV E/e' medial:  7.3 LV IVS:        1.40 cm   LV e' lateral:   6.53 cm/s LVOT diam:     2.30 cm   LV E/e' lateral: 9.4 LV SV:         111 LV SV Index:   46 LVOT Area:     4.15 cm  RIGHT VENTRICLE            IVC RV S prime:     8.16 cm/s  IVC diam: 2.70 cm TAPSE (M-mode): 2.2 cm AORTIC VALVE AV Area (Vmax):    2.73 cm AV Area (Vmean):   2.62 cm AV Area (VTI):     2.55 cm AV  Vmax:           160.00 cm/s AV Vmean:          111.000 cm/s AV VTI:            0.434 m AV Peak Grad:      10.2 mmHg AV Mean Grad:      5.0 mmHg LVOT Vmax:         105.00 cm/s LVOT Vmean:        70.100 cm/s LVOT VTI:          0.266 m LVOT/AV VTI ratio: 0.61  AORTA Ao Root diam: 3.50 cm Ao Asc diam:  3.90 cm MITRAL VALVE MV Area (PHT): 3.54 cm    SHUNTS MV Decel Time: 214 msec    Systemic VTI:  0.27 m MV E velocity: 61.60 cm/s  Systemic Diam: 2.30 cm MV Chisa Kushner velocity: 59.60 cm/s MV E/Estie Sproule ratio:  1.03 Stanly Leavens MD Electronically signed by Stanly Leavens MD Signature Date/Time: 03/16/2024/10:06:19 AM    Final    DG Chest Port 1 View Result Date: 03/16/2024 CLINICAL DATA:  Shortness of breath. EXAM: PORTABLE CHEST 1 VIEW COMPARISON:  03/15/2024 FINDINGS: Low volume film. The cardio pericardial silhouette is enlarged. Vascular congestion with diffuse interstitial opacity suggests edema. Tiny pleural effusions. Telemetry leads overlie the chest. IMPRESSION: Low volume film with vascular congestion and diffuse interstitial opacity suggesting edema. Electronically Signed   By: Camellia Candle M.D.   On: 03/16/2024 07:04        Scheduled Meds:  amiodarone   200 mg Oral BID   apixaban   5 mg Oral BID   aspirin  EC  81 mg Oral Daily   atorvastatin   40 mg Oral Daily   [START ON 03/18/2024] Chlorhexidine  Gluconate Cloth  6 each Topical Q0600   [START ON 03/18/2024] clopidogrel   75 mg Oral Q breakfast   cyanocobalamin   1,000 mcg Subcutaneous Daily   [START ON 03/19/2024] vitamin B-12  1,000 mcg Oral Daily   dorzolamide -timolol   1 drop Both Eyes BID   insulin  aspart  0-6 Units Subcutaneous TID WC   levothyroxine   175 mcg Oral Q0600   metoprolol  tartrate  25 mg Oral BID   pregabalin   50-100 mg Oral QHS   sodium chloride  flush  3 mL Intravenous Q12H   sodium chloride  flush  3 mL Intravenous Q12H   Continuous Infusions:  sodium chloride  Stopped (03/17/24 1450)   amiodarone  Stopped (03/15/24 1400)      LOS: 3 days    Time spent: over 30 min  Meliton Monte, MD [facility]   To contact the attending provider between 7A-7P or the covering provider during after hours 7P-7A, please log into the web site [email or web address] and access using universal [place] password for that web site. If you do not have the password, please call the hospital operator.  03/17/2024, 6:24 PM

## 2024-03-17 NOTE — Telephone Encounter (Signed)
  CAL notified at [facility].  Spoke to clinic nurse, Deja and gave phone number for PACU RN, D. Young, [phone]  Copied from CRM [id number]. Topic: Clinical - Red Word Triage >> Mar 17, 2024 12:04 PM Harlene ORN wrote: Red Word that prompted transfer to Nurse Triage: [facility]- Here for cardiac intervention (stint placed in one of his arteries) Patient is now complaining of neuropathy, burning and stinging in his feet. Reason for Disposition  [1] Caller requests to speak ONLY to PCP AND [2] URGENT question  Answer Assessment - Initial Assessment Questions 1. REASON FOR CALL or QUESTION: What is your reason for calling today? or How can I best     Calling to speak directly to Dr. Glendia for orders to manage peripheral neuropathy in post cardiac catheterization patient 2. CALLER: Document the source of call. (e.g., laboratory staff, caregiver or patient).     PACU RN D. Young, RN  Protocols used: PCP Call - No Triage-A-AH

## 2024-03-17 NOTE — Interval H&P Note (Signed)
 History and Physical Interval Note:  03/17/2024 8:22 AM  Gregory Crane  has presented today for surgery, with the diagnosis of nstemi.  The various methods of treatment have been discussed with the patient and family. After consideration of risks, benefits and other options for treatment, the patient has consented to  Procedure(s): LEFT HEART CATH AND CORONARY ANGIOGRAPHY (N/A) as a surgical intervention.  The patient's history has been reviewed, patient examined, no change in status, stable for surgery.  I have reviewed the patient's chart and labs.  Questions were answered to the patient's satisfaction.    Cath Lab Visit (complete for each Cath Lab visit)  Clinical Evaluation Leading to the Procedure:   ACS: Yes.    Non-ACS:    Anginal Classification: CCS III  Anti-ischemic medical therapy: Maximal Therapy (2 or more classes of medications)  Non-Invasive Test Results: No non-invasive testing performed  Prior CABG: No previous CABG       [facility] 03/17/2024 8:22 AM

## 2024-03-17 NOTE — Telephone Encounter (Signed)
 See other phone note. Pt currently admitted in hospital and is post cath.

## 2024-03-17 NOTE — Plan of Care (Signed)

## 2024-03-17 NOTE — Progress Notes (Signed)
 Site area: 68F right femoral arterial sheath Site Prior to Removal:  Level 0 Pressure Applied For: 30 minutes Manual:   yes Patient Status During Pull:  stable Post Pull Site:  Level 0 Post Pull Instructions Given:  yes Post Pull Pulses Present: right dp dopplered Dressing Applied:  gauze and tegaderm Bedrest begins @ 1315 Comments:

## 2024-03-17 NOTE — Telephone Encounter (Signed)
 Sending to PCP for FYI only   Received a telephone call from [facility] triage stating pt was currently admitted and his PACU nurse Debbie needed to speak with us  for orders. Called Debbie at the number provided ([phone]) Marval stated that she was trying to reach pt's cardiologist for orders and apologized for the confusion. Informed debbie that she could try reaching out via secure chat to see if that helps. Verbalized understanding and stated she didn't need anything else at this time.

## 2024-03-17 NOTE — Telephone Encounter (Signed)
 Reviewed. Sueanne also spoke to nurse and pt is currently admitted and they were needing some orders post cath. They will call hospitalist/cardiologist in hospital.

## 2024-03-17 NOTE — Progress Notes (Signed)
   03/17/24 0000  BiPAP/CPAP/SIPAP  Reason BIPAP/CPAP not in use Other(comment) (Pt declined Cpap for the night. Stated he will call if he decides to wear)

## 2024-03-17 NOTE — Progress Notes (Signed)
 Contacted [facility] this morning. Pt can resume on TTS 7:00 am chair time at d/c. Update provided to renal NP. Will assist as needed.   Randine Mungo Renal Navigator [phone]

## 2024-03-17 NOTE — Progress Notes (Signed)
 PHARMACY - ANTICOAGULATION CONSULT NOTE  Pharmacy Consult for Heparin  Indication: chest pain/ACS  No Known Allergies  Patient Measurements: Height: 6' (182.9 cm) Weight: 114.3 kg (251 lb 15.8 oz) IBW/kg (Calculated) : 77.6 HEPARIN  DW (KG): 104.8  Vital Signs: Temp: 98.3 F (36.8 C) (07/23 0400) Temp Source: Oral (07/23 0400) BP: 130/60 (07/23 0400) Pulse Rate: 52 (07/23 0400)  Labs: Recent Labs    03/15/24 1658 03/15/24 2050 03/15/24 2316 03/16/24 0520 03/16/24 1834 03/17/24 0449  HGB 9.0*  --   --  8.0*  --  9.2*  HCT 26.2*  --   --  23.6*  --  27.2*  PLT 139*  --   --  145*  --  172  APTT  --   --   --  54* 66* 70*  HEPARINUNFRC  --   --   --  >1.10* >1.10* >1.10*  CREATININE 10.01*  --   --  11.07*  --  7.89*  TROPONINIHS 6,025*  4,302* 7,307* 5,383*  --   --   --     Estimated Creatinine Clearance: 14.1 mL/min (A) (by C-G formula based on SCr of 7.89 mg/dL (H)).   Medical History: Past Medical History:  Diagnosis Date   Allergy    Anemia    Bell's palsy    Diabetes mellitus without complication (HCC)    diet controlled   Fall 07/2023   Hypertension    Hypothyroidism    Kidney stones    Pseudotumor cerebri    Stroke [facility])     Medications:  Scheduled:   amiodarone   200 mg Oral BID   aspirin  EC  81 mg Oral Daily   aspirin   325 mg Oral Once   atorvastatin   40 mg Oral Daily   Chlorhexidine  Gluconate Cloth  6 each Topical Q0600   cyanocobalamin   1,000 mcg Subcutaneous Daily   [START ON 03/19/2024] vitamin B-12  1,000 mcg Oral Daily   dorzolamide -timolol   1 drop Both Eyes BID   insulin  aspart  0-6 Units Subcutaneous TID WC   levothyroxine   175 mcg Oral Q0600   metoprolol  tartrate  25 mg Oral BID   pregabalin   50-100 mg Oral QHS   sodium chloride  flush  3 mL Intravenous Q12H   Infusions:   sodium chloride  10 mL/hr at 03/17/24 0355   amiodarone  Stopped (03/15/24 1400)   heparin  1,800 Units/hr (03/17/24 0246)   PRN: acetaminophen , albuterol ,  melatonin, ondansetron  (ZOFRAN ) IV, polyethylene glycol  Assessment: 53 yo male on chronic Eliquis  for afib. Pharmacy consulted to dose IV heparin  while Eliquis  on hold for cardiac workup for elevated troponin.  7/23:Heparin  level supratherapeutic/ falsely elevated d/t recent DOAC use. APTT therapeutic at 70 seconds with heparin  running at 1800 units/hour. H&H and platelets are stable and within normal limits.  Goal of Therapy:  Heparin  level 0.3-0.7 units/ml Monitor platelets by anticoagulation protocol: Yes   Plan:  Continue heparin  infusion to 1,800 units/hour  Check confirmatory aPTT in 8 hours Check aPTT and HL daily - once levels correlate, can monitor with HL only Continue to monitor H&H and platelets F/U cath plans   Dionicia Canavan, PharmD, RPh PGY1 Acute Care Pharmacy Resident [facility]  03/17/2024 6:39 AM

## 2024-03-17 NOTE — Progress Notes (Signed)
 Patient getting some relief from peripheral neuropathy

## 2024-03-18 ENCOUNTER — Encounter (HOSPITAL_COMMUNITY): Payer: Self-pay | Admitting: Cardiology

## 2024-03-18 ENCOUNTER — Other Ambulatory Visit (HOSPITAL_COMMUNITY): Payer: Self-pay

## 2024-03-18 DIAGNOSIS — I2510 Atherosclerotic heart disease of native coronary artery without angina pectoris: Secondary | ICD-10-CM

## 2024-03-18 DIAGNOSIS — Z9861 Coronary angioplasty status: Secondary | ICD-10-CM

## 2024-03-18 DIAGNOSIS — E8779 Other fluid overload: Secondary | ICD-10-CM | POA: Diagnosis not present

## 2024-03-18 DIAGNOSIS — I4891 Unspecified atrial fibrillation: Secondary | ICD-10-CM | POA: Diagnosis not present

## 2024-03-18 DIAGNOSIS — I2489 Other forms of acute ischemic heart disease: Secondary | ICD-10-CM | POA: Diagnosis not present

## 2024-03-18 LAB — CBC WITH DIFFERENTIAL/PLATELET
Abs Immature Granulocytes: 0.03 K/uL (ref 0.00–0.07)
Basophils Absolute: 0 K/uL (ref 0.0–0.1)
Basophils Relative: 1 %
Eosinophils Absolute: 0 K/uL (ref 0.0–0.5)
Eosinophils Relative: 1 %
HCT: 27.1 % — ABNORMAL LOW (ref 39.0–52.0)
Hemoglobin: 9.3 g/dL — ABNORMAL LOW (ref 13.0–17.0)
Immature Granulocytes: 1 %
Lymphocytes Relative: 11 %
Lymphs Abs: 0.6 K/uL — ABNORMAL LOW (ref 0.7–4.0)
MCH: 31.4 pg (ref 26.0–34.0)
MCHC: 34.3 g/dL (ref 30.0–36.0)
MCV: 91.6 fL (ref 80.0–100.0)
Monocytes Absolute: 0.5 K/uL (ref 0.1–1.0)
Monocytes Relative: 9 %
Neutro Abs: 4.5 K/uL (ref 1.7–7.7)
Neutrophils Relative %: 77 %
Platelets: 181 K/uL (ref 150–400)
RBC: 2.96 MIL/uL — ABNORMAL LOW (ref 4.22–5.81)
RDW: 12.9 % (ref 11.5–15.5)
WBC: 5.8 K/uL (ref 4.0–10.5)
nRBC: 0 % (ref 0.0–0.2)

## 2024-03-18 LAB — BASIC METABOLIC PANEL WITH GFR
Anion gap: 18 — ABNORMAL HIGH (ref 5–15)
BUN: 84 mg/dL — ABNORMAL HIGH (ref 6–20)
CO2: 21 mmol/L — ABNORMAL LOW (ref 22–32)
Calcium: 7.4 mg/dL — ABNORMAL LOW (ref 8.9–10.3)
Chloride: 95 mmol/L — ABNORMAL LOW (ref 98–111)
Creatinine, Ser: 9.58 mg/dL — ABNORMAL HIGH (ref 0.61–1.24)
GFR, Estimated: 6 mL/min — ABNORMAL LOW (ref >60)
Glucose, Bld: 120 mg/dL — ABNORMAL HIGH (ref 70–99)
Potassium: 4.1 mmol/L (ref 3.5–5.1)
Sodium: 134 mmol/L — ABNORMAL LOW (ref 135–145)

## 2024-03-18 LAB — GLUCOSE, CAPILLARY: Glucose-Capillary: 94 mg/dL (ref 70–99)

## 2024-03-18 MED ORDER — PENTAFLUOROPROP-TETRAFLUOROETH EX AERO: 1.0000 | INHALATION_SPRAY | CUTANEOUS | Status: DC | PRN

## 2024-03-18 MED ORDER — ATORVASTATIN CALCIUM 40 MG PO TABS
40.0000 mg | ORAL_TABLET | Freq: Every day | ORAL | 2 refills | Status: AC
  Filled 2024-03-18: qty 90, 90d supply, fill #0

## 2024-03-18 MED ORDER — HEPARIN SODIUM (PORCINE) 1000 UNIT/ML DIALYSIS: 1000.0000 [IU] | INTRAMUSCULAR | Status: DC | PRN

## 2024-03-18 MED ORDER — ASPIRIN 81 MG PO TBEC
81.0000 mg | DELAYED_RELEASE_TABLET | Freq: Every day | ORAL | 12 refills | Status: AC
  Filled 2024-03-18: qty 30, 30d supply, fill #0

## 2024-03-18 MED ORDER — LIDOCAINE HCL (PF) 1 % IJ SOLN: 5.0000 mL | INTRAMUSCULAR | Status: DC | PRN

## 2024-03-18 MED ORDER — CLOPIDOGREL BISULFATE 75 MG PO TABS
75.0000 mg | ORAL_TABLET | Freq: Every day | ORAL | 2 refills | Status: DC
  Filled 2024-03-18: qty 90, 90d supply, fill #0

## 2024-03-18 MED ORDER — PANTOPRAZOLE SODIUM 40 MG PO TBEC
40.0000 mg | DELAYED_RELEASE_TABLET | Freq: Every day | ORAL | 0 refills | Status: DC
  Filled 2024-03-18: qty 30, 30d supply, fill #0

## 2024-03-18 MED ORDER — ALTEPLASE 2 MG IJ SOLR: 2.0000 mg | Freq: Once | INTRAMUSCULAR | Status: DC | PRN

## 2024-03-18 MED ORDER — AMIODARONE HCL 200 MG PO TABS
200.0000 mg | ORAL_TABLET | Freq: Two times a day (BID) | ORAL | 1 refills | Status: AC
Start: 2024-03-18 — End: ?
  Filled 2024-03-18: qty 60, 30d supply, fill #0

## 2024-03-18 MED ORDER — LIDOCAINE-PRILOCAINE 2.5-2.5 % EX CREA: 1.0000 | TOPICAL_CREAM | CUTANEOUS | Status: DC | PRN

## 2024-03-18 NOTE — Progress Notes (Signed)
 [facility] Progress Note   Subjective:    Seen and examined patient on HD. Tolerating UFG 2.5L. BP is 141/59. S/p LHC with PCI to RCA 7/23 and now on DAPT. Discussed with Hospitalist. Plan for discharge today.   Objective Vitals:   03/18/24 0925 03/18/24 0940 03/18/24 0955 03/18/24 1010  BP: (!) 154/59 (!) 154/63 (!) 152/75 (!) 149/61  Pulse: (!) 51 (!) 50 (!) 52 (!) 51  Resp: 15 14 16 12   Temp:      TempSrc:      SpO2: 97% 95% 93% 93%  Weight:      Height:       Physical Exam General: Awake, alert, now on RA, NAD Heart: S1 and S2; No murmurs, gallops, or rubs Lungs: Clear anteriorly; No rales or wheezing Abdomen: Soft and non-tender Extremities: No LE edema Dialysis Access: L AVF (+) B/T   Filed Weights   03/17/24 0331 03/18/24 0510 03/18/24 0803  Weight: 114.3 kg 119.7 kg 113.5 kg    Intake/Output Summary (Last 24 hours) at 03/18/2024 1013 Last data filed at 03/18/2024 0529 Gross per 24 hour  Intake 740.51 ml  Output 100 ml  Net 640.51 ml    Additional Objective Labs: Basic Metabolic Panel: Recent Labs  Lab 03/15/24 1658 03/16/24 0520 03/17/24 0449 03/18/24 0356  NA 139 140 138 134*  K 3.8 3.8 3.8 4.1  CL 101 102 98 95*  CO2 18* 19* 22 21*  GLUCOSE 113* 94 76 120*  BUN 93* 106* 66* 84*  CREATININE 10.01* 11.07* 7.89* 9.58*  CALCIUM  7.5* 7.2* 7.7* 7.4*  PHOS 6.4* 10.3* 7.9*  --    Liver Function Tests: Recent Labs  Lab 03/15/24 1658 03/16/24 0520 03/17/24 0449  AST 21  --   --   ALT 12  --   --   ALKPHOS 59  --   --   BILITOT 1.1  --   --   PROT 6.8  --   --   ALBUMIN  3.3* 2.8* 2.8*   No results for input(s): LIPASE, AMYLASE in the last 168 hours. CBC: Recent Labs  Lab 03/15/24 0529 03/15/24 1519 03/15/24 1658 03/16/24 0520 03/17/24 0449 03/18/24 0356  WBC 10.8*  --  10.6* 6.1 5.2 5.8  NEUTROABS  --    < > 9.7* 4.8 3.9 4.5  HGB 9.6*   < > 9.0* 8.0* 9.2* 9.3*  HCT 29.1*   < > 26.2* 23.6* 27.2* 27.1*  MCV 96.0  --   92.9 93.7 92.8 91.6  PLT 151  --  139* 145* 172 181   < > = values in this interval not displayed.   Blood Culture    Component Value Date/Time   SDES BLOOD RIGHT [facility] 12/23/2023 1025   SPECREQUEST  12/23/2023 1025    BOTTLES DRAWN AEROBIC AND ANAEROBIC Blood Culture adequate volume   CULT  12/23/2023 1025    NO GROWTH 5 DAYS Performed at [facility], [address] [place], [place] [address]    REPTSTATUS 12/28/2023 FINAL 12/23/2023 1025    Cardiac Enzymes: No results for input(s): CKTOTAL, CKMB, CKMBINDEX, TROPONINI in the last 168 hours. CBG: Recent Labs  Lab 03/16/24 1631 03/16/24 2104 03/17/24 1102 03/17/24 1654 03/17/24 2049  GLUCAP 73 83 71 94 148*   Iron Studies: No results for input(s): IRON, TIBC, TRANSFERRIN, FERRITIN in the last 72 hours. Lab Results  Component Value Date   INR 1.3 (H) 08/05/2023   INR 1.0 05/04/2022   INR  0.85 04/09/2018   Studies/Results: DG CHEST PORT 1 VIEW Result Date: 03/17/2024 CLINICAL DATA:  Myocardial infarction. EXAM: PORTABLE CHEST 1 VIEW COMPARISON:  03/16/2024 FINDINGS: Mild enlargement of the cardiopericardial silhouette and indistinct pulmonary vasculature noted but previous edema has mostly resolved. There may be some faint residual retrocardiac edema in the left lower lobe. No blunting of the costophrenic angles. Atherosclerotic calcification of the aortic arch. Thoracic spondylosis. IMPRESSION: 1. Mild enlargement of the cardiopericardial silhouette and indistinct pulmonary vasculature, but previous edema has mostly resolved. There may be some faint residual retrocardiac edema in the left lower lobe. 2. Aortic Atherosclerosis (ICD10-I70.0). Electronically Signed   By: [name] Salvage M.D.   On: 03/17/2024 10:33   CARDIAC CATHETERIZATION Result Date: 03/17/2024   1st Diag lesion is 70% stenosed.   Prox LAD to Mid LAD lesion is 50% stenosed.   Mid Cx lesion is 50% stenosed.   Prox RCA lesion is 90%  stenosed.   Mid RCA lesion is 50% stenosed.   A drug-eluting stent was successfully placed using a STENT ONYX FRONTIER 2.25X12.   Post intervention, there is a 0% residual stenosis.   LV end diastolic pressure is mildly elevated.   Recommend to resume Apixaban , at currently prescribed dose and frequency on 03/17/2024.   Recommend concurrent antiplatelet therapy of Aspirin  81 mg for 1 month and Clopidogrel  75mg  daily for 12 months . 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 ASA for one month. Plavix  for 12 months.   NM Pulmonary Perfusion Result Date: 03/16/2024 CLINICAL DATA:  Shortness of breath. EXAM: NUCLEAR MEDICINE PERFUSION LUNG SCAN TECHNIQUE: Perfusion images were obtained in multiple projections after intravenous injection of radiopharmaceutical. Ventilation scans intentionally deferred if perfusion scan and chest x-ray adequate for interpretation during COVID 19 epidemic. RADIOPHARMACEUTICALS:  4.0 mCi Tc-42m MAA IV COMPARISON:  Chest x-ray 03/16/2024 and older. Noncontrast CT 03/15/2024 FINDINGS: Defects noted at the bases consistent with the patient's enlarged heart and the pleural effusions. Few separate small subsegmental perfusion defects are identified. IMPRESSION: Conglomeration of findings consistent with low probability perfusion lung scan. Please correlate with the opacities and effusion seen at the recent CT and x-ray Electronically Signed   By: Ranell Bring M.D.   On: 03/16/2024 18:11    Medications:  amiodarone  Stopped (03/15/24 1400)    amiodarone   200 mg Oral BID   apixaban   5 mg Oral BID   aspirin  EC  81 mg Oral Daily   atorvastatin   40 mg Oral Daily   Chlorhexidine  Gluconate Cloth  6 each Topical Q0600   clopidogrel   75 mg Oral Q breakfast   cyanocobalamin   1,000 mcg Subcutaneous Daily   [START ON 03/19/2024] vitamin B-12  1,000 mcg Oral Daily    dorzolamide -timolol   1 drop Both Eyes BID   insulin  aspart  0-6 Units Subcutaneous TID WC   levothyroxine   175 mcg Oral Q0600   metoprolol  tartrate  25 mg Oral BID   pregabalin   50-100 mg Oral QHS   sodium chloride  flush  3 mL Intravenous Q12H   sodium chloride  flush  3 mL Intravenous Q12H    Dialysis Orders: [facility] [phone] Records requested 7/22  Assessment/Plan: Respiratory distress: Shortness of breath after missing 1 week of dialysis. Per his outpatient HD center, his last HD was on 03/04/24. On Bipap at admit. Received HD 7/21 but cramped shortly after treatment was started.  1.9L removed. Recent Chest CT and CXR suggest ongoing vascular congestion and edema so received HD yesterday and 2.5L removed. He's feeling better and now on RA.  Elevated toponins/NSTEMI - Cardiology consulted. S/p LHC with PCI to RCA 7/23. Now on DAPT. On statin. A-flutter - On Amiodarone , Metoprolol , and Eliquis  ESRD: on HD TTS. On HD tolerating UFG 2.5L  HTN: Resume blood pressure lowering meds after dialysis Volume: Improving after HD.  Anemia of esrd: Hgb 9-11.,  Follow-up Dispo - Spoke to the NP at patient's outpatient HD center ([facility]) 7/22. His last HD there was on 03/04/24. Per the NP, patient has been refusing to come to his treatments. He feels HD is affecting his overall quality of life, feeling drained all the time. Was on PD in the past but didn't do well 2nd peritonitis. I had a long discussion with him and sister at bedside today on the importance of coming to his HD treatments as prescribed to prevent multiple admits. Also discussed fluid restriction and following low-Na diet. Per Renal Navigator, patient can return back to his outpatient HD clinic. Hopeful he can go home soon.  Dispo - Okay for discharge from a renal standpoint.   Charmaine Piety, NP [facility] 03/18/2024,10:13 AM  LOS: 4 days

## 2024-03-18 NOTE — Progress Notes (Signed)
 Received patient in bed to unit.  Alert and oriented.  Informed consent signed and in chart.   TX duration:3:15  Patient tolerated well.  Transported back to the room  Alert, without acute distress.  Hand-off given to patient's nurse.   Access used: AVF Access issues: NA  Total UF removed: 2.5L Medication(s) given: NA Post HD weight: 111.5KG      [facility]

## 2024-03-18 NOTE — Progress Notes (Signed)
 CARDIAC REHAB PHASE I   Pt seen in HD. Educated on restrictions, exercise guidelines, PCI & DAPT, MI Booklet, HH and Diabetic Nutrition, and CRP2. Pt is currently in PT 2x/week at [facility]. Referral to [facility] for CRP2, however pt unsure if able to attend given PT and dialysis schedule. All questions answered, pt voiced understanding.  [phone]   Con KATHEE Pereyra, MS, ACSM-CEP 03/18/2024 11:34 AM

## 2024-03-18 NOTE — Progress Notes (Signed)
 Reviewed AVS, patient expressed understanding of medications, MD follow up reviewed.   Removed IV, Site clean, dry and intact.  Patient states all belongings brought to the hospital at time of admission are accounted for and packed to take home.  Informed and expressed understanding to pick up medications from [facility] pharmacy before leaving hospital.  Morning meds held for HD; RN given meds; will contact [facility] Transport to have patient transported to entrance A where family member was waiting in vehicle to transport home.

## 2024-03-18 NOTE — Discharge Summary (Signed)
 Physician Discharge Summary   Patient: Gregory Crane MRN: [id number] DOB: 1971-03-29  Admit date:     03/14/2024  Discharge date: 03/18/24  Discharge Physician: Concepcion Riser   PCP: Glendia Shad, MD   Recommendations at discharge:  {Tip this will not be part of the note when signed- Example include specific recommendations for outpatient follow-up, pending tests to follow-up on. (Optional):26781} PCP follow up in 1 week. Cardiology follow up as scheduled. Nephrology and HD as per schedule.  Discharge Diagnoses: Principal Problem:   Hypervolemia Active Problems:   Atrial fibrillation with rapid ventricular response (HCC)   Hypoxic respiratory failure (HCC)   Demand ischemia (HCC)   Non-ST elevation (NSTEMI) myocardial infarction [facility])   CAD S/P percutaneous coronary angioplasty  Resolved Problems:   * No resolved hospital problems. *  Hospital Course: 53 year old male, morbidly obese, with past medical history significant for end-stage renal disease on hemodialysis TTS, hypertension, diabetes mellitus, atrial fibrillation on anticoagulation, prior CVA, hyperlipidemia and hypothyroidism who presented with acute hypoxic respiratory failure in the setting of volume overload in the setting of missed dialysis sessions.  Also was noted to have significantly elevated troponins, s/p LHC with PCI to mid RCA with DES.  Cardiology advised aspirin , Plavix  for 1 year.  Patient had hemodialysis 7/21, 7/22 and 7/24.  Patient feels better, chest pain-free.  Advised him to follow-up with primary care physician, neurology, nephrology and advised to be compliant with hemodialysis.  Patient understands and agrees with the discharge plan.  Assessment and Plan: Acute hypoxic respiratory failure Volume overload ESRD In setting of missed dialysis session Required bipap initially Now s/p HD 7/21 and 7/22 Appreciate renal assistance   NSTEMI In setting of missing dialysis Trop peaked to  ~7307 Now s/p LHC with single vessel obstructive CAD involving the mid RCA, successful PCI of mid RCA with DES Plan for eliquis  this evening.  ASA x1 month.  Plavix  x 12 months.    Afib/flutter with rapid ventricular response  Appreciate cardiology assistance - currently on amiodarone , metoprolol  (diltiazem  d/c'd with low BP interfering with dialysis) - eliquis     Hx CVA:  Eliquis   not on cholesterol statin (LDL 09/2023 was 66)   HTN: Diltiazem  on hold, lasix  on hold, hydralazine  on hold Currently only on metop    Hypothyroidism: Continue home Levothyroxine  Needs repeat TSH outside of acute illness, mildly elevated on 7/21 - follow outpatient   DM2   A1c 11/2022 6.2 SSI    Needs evaluation for OSA outpatient    Obesity Body mass index is 34.18 kg/m.       {Tip this will not be part of the note when signed Body mass index is 33.34 kg/m. , ,  (Optional):26781}  {(NOTE) Pain control PDMP Statment (Optional):26782} Consultants: *** Procedures performed: ***  Disposition: {Plan; Disposition:26390} Diet recommendation:  Discharge Diet Orders (From admission, onward)     Start     Ordered   03/18/24 0000  Diet - low sodium heart healthy        03/18/24 1029           {Diet_Plan:26776} DISCHARGE MEDICATION: Allergies as of 03/18/2024   No Known Allergies      Medication List     STOP taking these medications    Renvela  800 MG tablet Generic drug: sevelamer  carbonate   rosuvastatin  5 MG tablet Commonly known as: CRESTOR        TAKE these medications    amiodarone  200 MG tablet Commonly known as: PACERONE  Take  1 tablet (200 mg total) by mouth 2 (two) times daily.   apixaban  5 MG Tabs tablet Commonly known as: ELIQUIS  Take 1 tablet (5 mg total) by mouth 2 (two) times daily.   aspirin  EC 81 MG tablet Take 1 tablet (81 mg total) by mouth daily. Swallow whole.   atorvastatin  40 MG tablet Commonly known as: LIPITOR  Take 1 tablet (40 mg total) by  mouth daily.   calcium  carbonate 500 MG chewable tablet Commonly known as: TUMS - dosed in mg elemental calcium  Chew 2 tablets by mouth 3 (three) times daily with meals.   clopidogrel  75 MG tablet Commonly known as: PLAVIX  Take 1 tablet (75 mg total) by mouth daily with breakfast.   diltiazem  120 MG 24 hr capsule Commonly known as: CARDIZEM  CD Take 1 capsule (120 mg total) by mouth daily.   dorzolamide -timolol  2-0.5 % ophthalmic solution Commonly known as: COSOPT  Place 1 drop into both eyes 2 (two) times daily.   furosemide  40 MG tablet Commonly known as: LASIX  Take 1 tablet (40 mg total) by mouth 2 (two) times daily.   hydrALAZINE  25 MG tablet Commonly known as: APRESOLINE  Take 25 mg by mouth 2 (two) times daily.   levothyroxine  175 MCG tablet Commonly known as: SYNTHROID  TAKE 1 TABLET (175 MCG) BY MOUTH ONCE DAILY BEFORE BREAKFAST.   Metoprolol  Tartrate 37.5 MG Tabs Take 1 tablet (37.5 mg total) by mouth 2 (two) times daily.   pantoprazole  40 MG tablet Commonly known as: Protonix  Take 1 tablet (40 mg total) by mouth daily. Take this medication for the month you will be taking aspirin , plavix , and Eliquis  all together.  STOP after 03/18/24   pregabalin  50 MG capsule Commonly known as: Lyrica  1 -2 tablets q hs What changed:  how much to take how to take this when to take this additional instructions        Follow-up Information     Dialysis, [facility]. Go on 03/20/2024.   Why: Schedule is Tuesday, Thursday, Saturday with 7:00 am chair time. Contact information: [address] [place] [address] [phone]                Discharge Exam: Fredricka Weights   03/18/24 0510 03/18/24 0803 03/18/24 1140  Weight: 119.7 kg 113.5 kg 111.5 kg   ***  Condition at discharge: {DC Condition:26389}  The results of significant diagnostics from this hospitalization (including imaging, microbiology, ancillary and laboratory) are listed below for reference.    Imaging Studies: DG CHEST PORT 1 VIEW Result Date: 03/17/2024 CLINICAL DATA:  Myocardial infarction. EXAM: PORTABLE CHEST 1 VIEW COMPARISON:  03/16/2024 FINDINGS: Mild enlargement of the cardiopericardial silhouette and indistinct pulmonary vasculature noted but previous edema has mostly resolved. There may be some faint residual retrocardiac edema in the left lower lobe. No blunting of the costophrenic angles. Atherosclerotic calcification of the aortic arch. Thoracic spondylosis. IMPRESSION: 1. Mild enlargement of the cardiopericardial silhouette and indistinct pulmonary vasculature, but previous edema has mostly resolved. There may be some faint residual retrocardiac edema in the left lower lobe. 2. Aortic Atherosclerosis (ICD10-I70.0). Electronically Signed   By: [name] Salvage M.D.   On: 03/17/2024 10:33   CARDIAC CATHETERIZATION Result Date: 03/17/2024   1st Diag lesion is 70% stenosed.   Prox LAD to Mid LAD lesion is 50% stenosed.   Mid Cx lesion is 50% stenosed.   Prox RCA lesion is 90% stenosed.   Mid RCA lesion is 50% stenosed.   A drug-eluting stent was successfully  placed using a STENT ONYX FRONTIER 2.25X12.   Post intervention, there is a 0% residual stenosis.   LV end diastolic pressure is mildly elevated.   Recommend to resume Apixaban , at currently prescribed dose and frequency on 03/17/2024.   Recommend concurrent antiplatelet therapy of Aspirin  81 mg for 1 month and Clopidogrel  75mg  daily for 12 months . 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 ASA for one month. Plavix  for 12 months.   NM Pulmonary Perfusion Result Date: 03/16/2024 CLINICAL DATA:  Shortness of breath. EXAM: NUCLEAR MEDICINE PERFUSION LUNG SCAN TECHNIQUE: Perfusion images were obtained in multiple projections after intravenous injection of radiopharmaceutical. Ventilation scans intentionally deferred  if perfusion scan and chest x-ray adequate for interpretation during COVID 19 epidemic. RADIOPHARMACEUTICALS:  4.0 mCi Tc-74m MAA IV COMPARISON:  Chest x-ray 03/16/2024 and older. Noncontrast CT 03/15/2024 FINDINGS: Defects noted at the bases consistent with the patient's enlarged heart and the pleural effusions. Few separate small subsegmental perfusion defects are identified. IMPRESSION: Conglomeration of findings consistent with low probability perfusion lung scan. Please correlate with the opacities and effusion seen at the recent CT and x-ray Electronically Signed   By: Ranell Bring M.D.   On: 03/16/2024 18:11   ECHOCARDIOGRAM COMPLETE Result Date: 03/16/2024    ECHOCARDIOGRAM REPORT   Patient Name:   [facility] Date of Exam: 03/16/2024 Medical Rec #:  [id number]      Height:       72.0 in Accession #:    [id number]     Weight:       260.4 lb Date of Birth:  1971-07-07     BSA:          2.383 m Patient Age:    52 years       BP:           112/53 mmHg Patient Gender: M              HR:           57 bpm. Exam Location:  Inpatient Procedure: 2D Echo, Cardiac Doppler and Color Doppler (Both Spectral and Color            Flow Doppler were utilized during procedure). Indications:    Pericaridal Effusion  History:        Patient has prior history of Echocardiogram examinations.                 Arrythmias:Atrial Fibrillation; Risk Factors:Hypertension,                 Diabetes and Dyslipidemia.  Sonographer:    CM Referring Phys: [id number] SYLVESTER I OGBATA IMPRESSIONS  1. Left ventricular ejection fraction, by estimation, is 60 to 65%. The left ventricle has normal function. The left ventricle has no regional wall motion abnormalities. There is moderate concentric left ventricular hypertrophy. Left ventricular diastolic parameters are consistent with Grade I diastolic dysfunction (impaired relaxation).  2. Right ventricular systolic function is normal. The right ventricular size is moderately enlarged.  3. The  mitral valve is normal in structure. No evidence of mitral valve regurgitation. No evidence of mitral stenosis.  4. The aortic valve is tricuspid. There is mild calcification of the aortic valve. Aortic valve regurgitation is not visualized. Aortic valve sclerosis is present, with no evidence of aortic valve stenosis.  5. The inferior vena cava is normal in size  with <50% respiratory variability, suggesting right atrial pressure of 8 mmHg. Comparison(s): No significant change from prior study. Prior images reviewed side by side. FINDINGS  Left Ventricle: Left ventricular ejection fraction, by estimation, is 60 to 65%. The left ventricle has normal function. The left ventricle has no regional wall motion abnormalities. The left ventricular internal cavity size was normal in size. There is  moderate concentric left ventricular hypertrophy. Left ventricular diastolic parameters are consistent with Grade I diastolic dysfunction (impaired relaxation). Right Ventricle: The right ventricular size is moderately enlarged. No increase in right ventricular wall thickness. Right ventricular systolic function is normal. Left Atrium: Left atrial size was normal in size. Right Atrium: Right atrial size was normal in size. Pericardium: There is no evidence of pericardial effusion. Presence of epicardial fat layer. Mitral Valve: The mitral valve is normal in structure. No evidence of mitral valve regurgitation. No evidence of mitral valve stenosis. Tricuspid Valve: The tricuspid valve is normal in structure. Tricuspid valve regurgitation is not demonstrated. No evidence of tricuspid stenosis. Aortic Valve: The aortic valve is tricuspid. There is mild calcification of the aortic valve. Aortic valve regurgitation is not visualized. Aortic valve sclerosis is present, with no evidence of aortic valve stenosis. Aortic valve mean gradient measures 5.0 mmHg. Aortic valve peak gradient measures 10.2 mmHg. Aortic valve area, by VTI measures  2.55 cm. Pulmonic Valve: The pulmonic valve was normal in structure. Pulmonic valve regurgitation is trivial. No evidence of pulmonic stenosis. Aorta: The aortic root and ascending aorta are structurally normal, with no evidence of dilitation. Venous: The inferior vena cava is normal in size with less than 50% respiratory variability, suggesting right atrial pressure of 8 mmHg. IAS/Shunts: The atrial septum is grossly normal.  LEFT VENTRICLE PLAX 2D LVIDd:         5.40 cm   Diastology LVIDs:         3.60 cm   LV e' medial:    8.49 cm/s LV PW:         1.20 cm   LV E/e' medial:  7.3 LV IVS:        1.40 cm   LV e' lateral:   6.53 cm/s LVOT diam:     2.30 cm   LV E/e' lateral: 9.4 LV SV:         111 LV SV Index:   46 LVOT Area:     4.15 cm  RIGHT VENTRICLE            IVC RV S prime:     8.16 cm/s  IVC diam: 2.70 cm TAPSE (M-mode): 2.2 cm AORTIC VALVE AV Area (Vmax):    2.73 cm AV Area (Vmean):   2.62 cm AV Area (VTI):     2.55 cm AV Vmax:           160.00 cm/s AV Vmean:          111.000 cm/s AV VTI:            0.434 m AV Peak Grad:      10.2 mmHg AV Mean Grad:      5.0 mmHg LVOT Vmax:         105.00 cm/s LVOT Vmean:        70.100 cm/s LVOT VTI:          0.266 m LVOT/AV VTI ratio: 0.61  AORTA Ao Root diam: 3.50 cm Ao Asc diam:  3.90 cm MITRAL VALVE MV Area (PHT): 3.54 cm    SHUNTS  MV Decel Time: 214 msec    Systemic VTI:  0.27 m MV E velocity: 61.60 cm/s  Systemic Diam: 2.30 cm MV A velocity: 59.60 cm/s MV E/A ratio:  1.03 Stanly Leavens MD Electronically signed by Stanly Leavens MD Signature Date/Time: 03/16/2024/10:06:19 AM    Final    DG Chest Port 1 View Result Date: 03/16/2024 CLINICAL DATA:  Shortness of breath. EXAM: PORTABLE CHEST 1 VIEW COMPARISON:  03/15/2024 FINDINGS: Low volume film. The cardio pericardial silhouette is enlarged. Vascular congestion with diffuse interstitial opacity suggests edema. Tiny pleural effusions. Telemetry leads overlie the chest. IMPRESSION: Low volume film with  vascular congestion and diffuse interstitial opacity suggesting edema. Electronically Signed   By: Camellia Candle M.D.   On: 03/16/2024 07:04   CT Chest Wo Contrast Result Date: 03/15/2024 CLINICAL DATA:  Dyspnea, chronic, central airway disease suspected EXAM: CT CHEST WITHOUT CONTRAST TECHNIQUE: Multidetector CT imaging of the chest was performed following the standard protocol without IV contrast. RADIATION DOSE REDUCTION: This exam was performed according to the departmental dose-optimization program which includes automated exposure control, adjustment of the mA and/or kV according to patient size and/or use of iterative reconstruction technique. COMPARISON:  Radiograph earlier today.  Chest CT 12/24/2023 FINDINGS: Cardiovascular: The heart is enlarged. There are coronary artery calcifications. No pericardial effusion. Aortic atherosclerosis. No aortic aneurysm. Main pulmonary artery is dilated at 3.8 cm. Mediastinum/Nodes: Shotty mediastinal lymphadenopathy, largest lymph node measures 9 mm short axis series 2, image 60. Hilar assessment is limited in the absence of IV contrast. Patulous esophagus without wall thickening. Lungs/Pleura: Small bilateral pleural effusions, right greater than left. These have slightly diminished since April exam. Associated compressive atelectasis in the lower lobes. Multifocal ground-glass and nodular opacities throughout both lungs with areas of septal thickening. Bowing of the distal trachea and central airways. Mild central bronchial thickening. Upper Abdomen: Cholecystectomy. No acute upper abdominal findings or free fluid. Musculoskeletal: Flowing anterior osteophytes throughout the thoracic spine. There are no acute or suspicious osseous abnormalities. IMPRESSION: 1. Multifocal ground-glass and nodular opacities throughout both lungs with areas of septal thickening. Findings may represent pulmonary edema or atypical infection. 2. Small bilateral pleural effusions, right  greater than left. These have slightly diminished since April exam. 3. Cardiomegaly with coronary artery calcifications. 4. Dilated main pulmonary artery, can be seen with pulmonary arterial hypertension. 5. Shotty mediastinal lymphadenopathy, likely reactive. 6. 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 Aortic Atherosclerosis (ICD10-I70.0). Electronically Signed   By: Andrea Gasman M.D.   On: 03/15/2024 18:06   DG CHEST PORT 1 VIEW Result Date: 03/15/2024 CLINICAL DATA:  Difficulty breathing, low O2 sats, tachycardia. EXAM: PORTABLE CHEST 1 VIEW COMPARISON:  03/14/2024 and CT chest 12/24/2023. FINDINGS: Trachea is midline. Heart is enlarged. Diffuse mixed interstitial and airspace opacification appears progressive from 03/14/2024. Tiny bilateral pleural effusions. IMPRESSION: Coarsening congestive heart failure. Electronically Signed   By: Newell Eke M.D.   On: 03/15/2024 15:02   DG Chest Portable 1 View Result Date: 03/14/2024 EXAM: 1 VIEW XRAY OF THE CHEST 03/14/2024 09:23:00 PM COMPARISON: 12/23/2023 CLINICAL HISTORY: Chest pain, dyspnea. The patient has missed dialysis for one week, is complaining of being unable to breathe, has a fistula in the left arm, low O2 sats, rapid heart rate of 150, and nasal O2 placed. FINDINGS: LUNGS AND PLEURA: No focal pulmonary opacity. No pulmonary edema. No pleural effusion. No pneumothorax. Bibasilar atelectasis. HEART AND MEDIASTINUM: Mild cardiomegaly, unchanged. BONES AND SOFT TISSUES: No acute  osseous abnormality. IMPRESSION: 1. Mild cardiomegaly, unchanged. 2. Bibasilar atelectasis. Electronically signed by: Franky Stanford MD 03/14/2024 09:32 PM EDT RP Workstation: [id number]    Microbiology: Results for orders placed or performed during the hospital encounter of 12/23/23  Blood Culture (routine x 2)     Status: None   Collection Time: 12/23/23  8:52 AM   Specimen: BLOOD  Result Value Ref Range Status    Specimen Description BLOOD BLOOD RIGHT ARM  Final   Special Requests   Final    BOTTLES DRAWN AEROBIC AND ANAEROBIC Blood Culture adequate volume   Culture   Final    NO GROWTH 5 DAYS Performed at [facility], [address] [place], [place] [address]    Report Status 12/28/2023 FINAL  Final  Blood Culture (routine x 2)     Status: None   Collection Time: 12/23/23 10:25 AM   Specimen: BLOOD  Result Value Ref Range Status   Specimen Description BLOOD RIGHT [facility]  Final   Special Requests   Final    BOTTLES DRAWN AEROBIC AND ANAEROBIC Blood Culture adequate volume   Culture   Final    NO GROWTH 5 DAYS Performed at [facility], [address] [place], [place] [address]    Report Status 12/28/2023 FINAL  Final  MRSA Next Gen by PCR, Nasal     Status: None   Collection Time: 12/23/23  5:00 PM   Specimen: Nasal Mucosa; Nasal Swab  Result Value Ref Range Status   MRSA by PCR Next Gen NOT DETECTED NOT DETECTED Final    Comment: (NOTE) The GeneXpert MRSA Assay (FDA approved for NASAL specimens only), is one component of a comprehensive MRSA colonization surveillance program. It is not intended to diagnose MRSA infection nor to guide or monitor treatment for MRSA infections. Test performance is not FDA approved in patients less than 30 years old. Performed at [facility], [address] [place], [place] [address]   Respiratory (~20 pathogens) panel by PCR     Status: None   Collection Time: 12/23/23  6:02 PM   Specimen: Nasopharyngeal Swab; Respiratory  Result Value Ref Range Status   Adenovirus NOT DETECTED NOT DETECTED Final   Coronavirus 229E NOT DETECTED NOT DETECTED Final    Comment: (NOTE) The Coronavirus on the Respiratory Panel, DOES NOT test for the novel  Coronavirus (2019 nCoV)    Coronavirus HKU1 NOT DETECTED NOT DETECTED Final   Coronavirus NL63 NOT DETECTED NOT DETECTED Final   Coronavirus OC43 NOT DETECTED NOT DETECTED Final    Metapneumovirus NOT DETECTED NOT DETECTED Final   Rhinovirus / Enterovirus NOT DETECTED NOT DETECTED Final   Influenza A NOT DETECTED NOT DETECTED Final   Influenza B NOT DETECTED NOT DETECTED Final   Parainfluenza Virus 1 NOT DETECTED NOT DETECTED Final   Parainfluenza Virus 2 NOT DETECTED NOT DETECTED Final   Parainfluenza Virus 3 NOT DETECTED NOT DETECTED Final   Parainfluenza Virus 4 NOT DETECTED NOT DETECTED Final   Respiratory Syncytial Virus NOT DETECTED NOT DETECTED Final   Bordetella pertussis NOT DETECTED NOT DETECTED Final   Bordetella Parapertussis NOT DETECTED NOT DETECTED Final   Chlamydophila pneumoniae NOT DETECTED NOT DETECTED Final   Mycoplasma pneumoniae NOT DETECTED NOT DETECTED Final    Comment: Performed at [facility], [address], [place], [place] [address]  Resp panel by RT-PCR (RSV, Flu A&B, Covid) Anterior Nasal Swab     Status: None   Collection Time: 12/24/23  6:01 PM   Specimen: Anterior Nasal Swab  Result Value Ref Range Status   SARS Coronavirus 2 by RT PCR NEGATIVE NEGATIVE Final    Comment: (NOTE) SARS-CoV-2 target nucleic acids are NOT DETECTED.  The SARS-CoV-2 RNA is generally detectable in upper respiratory specimens during the acute phase of infection. The lowest concentration of SARS-CoV-2 viral copies this assay can detect is 138 copies/mL. A negative result does not preclude SARS-Cov-2 infection and should not be used as the sole basis for treatment or other patient management decisions. A negative result may occur with  improper specimen collection/handling, submission of specimen other than nasopharyngeal swab, presence of viral mutation(s) within the areas targeted by this assay, and inadequate number of viral copies(<138 copies/mL). A negative result must be combined with clinical observations, patient history, and epidemiological information. The expected result is Negative.  Fact Sheet for Patients:   BloggerCourse.com  Fact Sheet for Healthcare Providers:  SeriousBroker.it  This test is no t yet approved or cleared by the [place]  FDA and  has been authorized for detection and/or diagnosis of SARS-CoV-2 by FDA under an Emergency Use Authorization (EUA). This EUA will remain  in effect (meaning this test can be used) for the duration of the COVID-19 declaration under Section 564(b)(1) of the Act, 21 U.S.C.section 360bbb-3(b)(1), unless the authorization is terminated  or revoked sooner.       Influenza A by PCR NEGATIVE NEGATIVE Final   Influenza B by PCR NEGATIVE NEGATIVE Final    Comment: (NOTE) The Xpert Xpress SARS-CoV-2/FLU/RSV plus assay is intended as an aid in the diagnosis of influenza from Nasopharyngeal swab specimens and should not be used as a sole basis for treatment. Nasal washings and aspirates are unacceptable for Xpert Xpress SARS-CoV-2/FLU/RSV testing.  Fact Sheet for Patients: BloggerCourse.com  Fact Sheet for Healthcare Providers: SeriousBroker.it  This test is not yet approved or cleared by the [place]  FDA and has been authorized for detection and/or diagnosis of SARS-CoV-2 by FDA under an Emergency Use Authorization (EUA). This EUA will remain in effect (meaning this test can be used) for the duration of the COVID-19 declaration under Section 564(b)(1) of the Act, 21 U.S.C. section 360bbb-3(b)(1), unless the authorization is terminated or revoked.     Resp Syncytial Virus by PCR NEGATIVE NEGATIVE Final    Comment: (NOTE) Fact Sheet for Patients: BloggerCourse.com  Fact Sheet for Healthcare Providers: SeriousBroker.it  This test is not yet approved or cleared by the [place]  FDA and has been authorized for detection and/or diagnosis of SARS-CoV-2 by FDA under an Emergency Use  Authorization (EUA). This EUA will remain in effect (meaning this test can be used) for the duration of the COVID-19 declaration under Section 564(b)(1) of the Act, 21 U.S.C. section 360bbb-3(b)(1), unless the authorization is terminated or revoked.  Performed at [facility], [address] [place], [place] [address]    *Note: Due to a large number of results and/or encounters for the requested time period, some results have not been displayed. A complete set of results can be found in Results Review.    Labs: CBC: Recent Labs  Lab 03/15/24 0529 03/15/24 1519 03/15/24 1658 03/16/24 0520 03/17/24 0449 03/18/24 0356  WBC 10.8*  --  10.6* 6.1 5.2 5.8  NEUTROABS  --   --  9.7* 4.8 3.9 4.5  HGB 9.6* 10.5* 9.0* 8.0* 9.2* 9.3*  HCT 29.1* 31.0* 26.2* 23.6* 27.2* 27.1*  MCV 96.0  --  92.9 93.7 92.8 91.6  PLT 151  --  139* 145* 172 181   Basic Metabolic Panel: Recent Labs  Lab 03/14/24 2029 03/14/24 2210 03/15/24 0529 03/15/24 1519 03/15/24 1658 03/16/24 0520 03/17/24 0449 03/18/24 0356  NA 142   < > 142 139 139 140 138 134*  K 4.7   < > 4.9 3.6 3.8 3.8 3.8 4.1  CL 106  --  107  --  101 102 98 95*  CO2 12*  --  12*  --  18* 19* 22 21*  GLUCOSE 147*  --  160*  --  113* 94 76 120*  BUN 154*  --  158*  --  93* 106* 66* 84*  CREATININE 14.73*  --  14.62*  --  10.01* 11.07* 7.89* 9.58*  CALCIUM  7.0*  --  7.0*  --  7.5* 7.2* 7.7* 7.4*  MG 2.6*  --  2.6*  --   --  2.2  --   --   PHOS  --   --  >30.0*  --  6.4* 10.3* 7.9*  --    < > = values in this interval not displayed.   Liver Function Tests: Recent Labs  Lab 03/15/24 1658 03/16/24 0520 03/17/24 0449  AST 21  --   --   ALT 12  --   --   ALKPHOS 59  --   --   BILITOT 1.1  --   --   PROT 6.8  --   --   ALBUMIN  3.3* 2.8* 2.8*   CBG: Recent Labs  Lab 03/16/24 1631 03/16/24 2104 03/17/24 1102 03/17/24 1654 03/17/24 2049  GLUCAP 73 83 71 94 148*    Discharge time spent: {LESS THAN/GREATER  THAN:26388} 30 minutes.  Signed: Concepcion Riser, MD [facility] 03/18/2024

## 2024-03-18 NOTE — TOC Transition Note (Signed)
 Transition of Care [facility]) - Discharge Note   Patient Details  Name: Gregory Crane MRN: [id number] Date of Birth: 21-Oct-1970  Transition of Care [facility]) CM/SW Contact:  Andrez JULIANNA George, RN Phone Number: 03/18/2024, 2:04 PM   Clinical Narrative:     Pt is discharging home with self care. Pt missed 1 week of HD per personal choice. CM discussed this with him and he says he will continue HD for now but only Tues and Thursdays as he has been going. He refuses to go Saturday. CM inquired about palliative talking to him but he declined.  CM has called [place] transportation and he is back on the schedule for pick up Tues/ Thursdays for HD.  Pts sister to provide transportation home.   Final next level of care: Home/Self Care Barriers to Discharge: No Barriers Identified   Patient Goals and CMS Choice            Discharge Placement                       Discharge Plan and Services Additional resources added to the After Visit Summary for                                       Social Drivers of Health (SDOH) Interventions SDOH Screenings   Food Insecurity: No Food Insecurity (03/15/2024)  Housing: Low Risk  (03/15/2024)  Transportation Needs: No Transportation Needs (03/15/2024)  Utilities: Not At Risk (03/15/2024)  Alcohol Screen: Low Risk  (06/25/2023)  Depression (PHQ2-9): Medium Risk (02/23/2024)  Financial Resource Strain: Medium Risk (09/12/2023)   Received from [facility]  Physical Activity: Insufficiently Active (06/25/2023)  Social Connections: Moderately Isolated (06/25/2023)  Stress: No Stress Concern Present (07/09/2023)   Received from [facility]  Tobacco Use: Low Risk  (03/16/2024)  Health Literacy: Adequate Health Literacy (06/25/2023)     Readmission Risk Interventions     No data to display

## 2024-03-18 NOTE — Progress Notes (Addendum)
 D/C order noted. Contacted [facility] to advise clinic of pt's d/c today. Unable to reach staff. Message left requesting a return call. Attempted to reach pt via phone to advise pt that his HD days/time will remain the same at d/c. Unable to reach pt and message left to make pt aware of this info. Will add HD appt to [facility] as well. Will fax d/c summary to clinic for continuation of care once clinic returns navigators call.   Randine Mungo Dialysis Navigator [phone]  Addendum at 2:08 pm: Spoke to staff at [facility] to be advised that pt will d/c today and resume on Saturday. Will fax d/c summary and most recent renal note to clinic once d/c summary is available.   Addendum at 9:12 am on 03/19/24: D/C summary and last renal note faxed to clinic for continuation of care.

## 2024-03-19 ENCOUNTER — Telehealth: Payer: Self-pay

## 2024-03-19 LAB — LIPOPROTEIN A (LPA): Lipoprotein (a): 63.9 nmol/L — ABNORMAL HIGH (ref <75.0)

## 2024-03-19 NOTE — Transitions of Care (Post Inpatient/ED Visit) (Signed)
   03/19/2024  Name: Richey Doolittle MRN: [id number] DOB: 23-Sep-1970  Today's TOC FU Call Status: Today's TOC FU Call Status:: Unsuccessful Call (1st Attempt) Unsuccessful Call (1st Attempt) Date: 03/19/24  Attempted to reach the patient regarding the most recent Inpatient/ED visit.  Follow Up Plan: Additional outreach attempts will be made to reach the patient to complete the Transitions of Care (Post Inpatient/ED visit) call.   Arvin Seip RN, BSN, CCM [employer], Population Health Case Manager Phone: [phone]

## 2024-03-19 NOTE — Transitions of Care (Post Inpatient/ED Visit) (Signed)
   03/19/2024  Name: Gregory Crane MRN: [id number] DOB: 05/06/1971  Today's TOC FU Call Status: Today's TOC FU Call Status:: Unsuccessful Call (2nd Attempt) Unsuccessful Call (2nd Attempt) Date: 03/19/24  Attempted to reach the patient regarding the most recent Inpatient/ED visit.  Follow Up Plan: Additional outreach attempts will be made to reach the patient to complete the Transitions of Care (Post Inpatient/ED visit) call.   Shona Prow RN, CCM [place]  VBCI-Population Health RN Care Manager [phone]

## 2024-03-22 ENCOUNTER — Ambulatory Visit

## 2024-03-22 ENCOUNTER — Telehealth: Payer: Self-pay

## 2024-03-22 NOTE — Transitions of Care (Post Inpatient/ED Visit) (Signed)
   03/22/2024  Name: Gregory Crane MRN: [id number] DOB: Jan 07, 1971  Today's TOC FU Call Status: Today's TOC FU Call Status:: Unsuccessful Call (3rd Attempt) Unsuccessful Call (3rd Attempt) Date: 03/22/24  Attempted to reach the patient regarding the most recent Inpatient/ED visit.  Follow Up Plan: No further outreach attempts will be made at this time. We have been unable to contact the patient.  Shona Prow RN, CCM [place]  VBCI-Population Health RN Care Manager [phone]

## 2024-03-23 NOTE — Therapy (Incomplete)
 OUTPATIENT PHYSICAL THERAPY NEURO TREATMENT   Patient Name: Gregory Crane MRN: [id number] DOB:09/16/1970, 53 y.o., male Today's Date: 03/23/2024   PCP: Glendia Shad  REFERRING PROVIDER: Glendia Shad  END OF SESSION:              Past Medical History:  Diagnosis Date   Allergy    Anemia    Bell's palsy    Diabetes mellitus without complication (HCC)    diet controlled   Fall 07/2023   Hypertension    Hypothyroidism    Kidney stones    Pseudotumor cerebri    Stroke [facility])    Past Surgical History:  Procedure Laterality Date   COLONOSCOPY WITH PROPOFOL  N/A 03/30/2020   Procedure: COLONOSCOPY WITH PROPOFOL ;  Surgeon: Maryruth Ole DASEN, MD;  Location: [facility] ENDOSCOPY;  Service: Endoscopy;  Laterality: N/A;   CORONARY STENT INTERVENTION N/A 03/17/2024   Procedure: CORONARY STENT INTERVENTION;  Surgeon: Swazil[place], Peter M, MD;  Location: [facility] INVASIVE CV LAB;  Service: Cardiovascular;  Laterality: N/A;   LEFT HEART CATH AND CORONARY ANGIOGRAPHY N/A 03/17/2024   Procedure: LEFT HEART CATH AND CORONARY ANGIOGRAPHY;  Surgeon: Swaziland, Peter M, MD;  Location: [facility] INVASIVE CV LAB;  Service: Cardiovascular;  Laterality: N/A;   LOOP RECORDER INSERTION N/A 01/27/2018   Procedure: LOOP RECORDER INSERTION;  Surgeon: Fernande Elspeth BROCKS, MD;  Location: [facility] INVASIVE CV LAB;  Service: Cardiovascular;  Laterality: N/A;   LUMBAR PUNCTURE     as child   NO PAST SURGERIES     REMOVAL OF A DIALYSIS CATHETER     TEE WITHOUT CARDIOVERSION N/A 01/07/2018   Procedure: TRANSESOPHAGEAL ECHOCARDIOGRAM (TEE);  Surgeon: Perla Evalene PARAS, MD;  Location: [facility] ORS;  Service: Cardiovascular;  Laterality: N/A;   Patient Active Problem List   Diagnosis Date Noted   CAD S/P percutaneous coronary angioplasty 03/17/2024   Non-ST elevation (NSTEMI) myocardial infarction (HCC) 03/16/2024   Hypervolemia 03/14/2024   Hypoxic respiratory failure (HCC) 03/14/2024   Demand ischemia (HCC) 03/14/2024   Left  hip pain 01/30/2024   Acute on chronic heart failure with preserved ejection fraction (HFpEF) (HCC) 12/26/2023   Community acquired pneumonia 12/24/2023   Acute respiratory failure with hypoxia (HCC) 12/23/2023   SVT (supraventricular tachycardia) (HCC) 08/10/2023   Atrial fibrillation with rapid ventricular response (HCC) 07/03/2023   Atrial fibrillation with RVR (HCC) 07/02/2023   Fall 06/15/2023   Non-compliance with renal dialysis (HCC) 05/02/2023   Obesity (BMI 30-39.9) 04/30/2023   Volume overload 04/29/2023   Renal osteodystrophy 11/25/2022   Unsteady gait 10/05/2022   Steal syndrome of dialysis vascular access (HCC) 05/28/2022   Hydronephrosis, left 05/04/2022   Leukocytosis 05/04/2022   Chest pain 01/13/2022   Diabetic retinopathy associated with diabetes mellitus due to underlying condition (HCC) 01/13/2022   ESRD on hemodialysis (HCC) 01/13/2022   Deafness in right ear 08/11/2021   Open wound 01/25/2021   History of colon polyps 10/15/2020   Acute cholecystitis without calculus 09/09/2020   Type 2 diabetes mellitus, with long-term current use of insulin  (HCC) 09/09/2020   Cryptogenic stroke (HCC) 07/13/2020   History of loop recorder 07/13/2020   Elevated troponin 04/03/2020   Acquired trigger finger 06/08/2019   Lymphedema 06/08/2019   Anemia 04/17/2019   Swelling of both lower extremities 01/10/2019   Facial droop 04/09/2018   Daytime somnolence 03/30/2018   Carotid artery disease (HCC) 02/03/2018   Intracranial vascular stenosis 09/12/2017   Cough 01/20/2017   Bell's palsy 11/10/2016   History of CVA (cerebrovascular  accident) 11/10/2016   Benign localized hyperplasia of prostate with urinary obstruction 10/27/2016   History of nephrolithiasis 10/27/2016   TIA (transient ischemic attack) 10/20/2016   Near syncope 06/23/2016   Organic impotence 10/01/2015   Neuropathy 08/06/2015   Health care maintenance 08/06/2015   Hypertension 08/06/2015   Hypothyroidism  10/10/2013   Microalbuminuria 10/10/2013   Hyperlipidemia 10/10/2013   B12 deficiency 10/10/2013   Environmental allergies 07/04/2013    ONSET DATE: 3 years ago  REFERRING DIAG: unsteady gait  THERAPY DIAG:  Muscle weakness (generalized)  Unsteadiness on feet  Other abnormalities of gait and mobility  Difficulty in walking, not elsewhere classified  Abnormality of gait and mobility  Other lack of coordination  Rationale for Evaluation and Treatment: Rehabilitation  SUBJECTIVE:                                                                                                                                                                                             SUBJECTIVE STATEMENT: ***  Pt reports feeling okay today. Pt denies excessive soreness after previous session. Pt arrives without AFO today.  Pt accompanied by: self  PERTINENT HISTORY: Patient returning to PT s/p hospitalization for pneumonia, , SVT, ESRD on hypodialysis. PMH DM, HTN, Anemia, Bells palsy, Hypothyroidism, Kidney stones, stroke and pseudotumour cerebri. Macular degeneration with impaired visual acuity   PAIN:  Are you having pain? Some L foot numbness/neuropathy  PRECAUTIONS: Fall  RED FLAGS: None   WEIGHT BEARING RESTRICTIONS: No  FALLS: Has patient fallen in last 6 months? No  LIVING ENVIRONMENT: Lives with: lives alone Lives in: House/apartment Stairs: yes stairs in house Has following equipment at home: Single point cane  PLOF: Independent with basic ADLs  PATIENT GOALS: balance, strength  OBJECTIVE:  Note: Objective measures were completed at Evaluation unless otherwise noted.  DIAGNOSTIC FINDINGS: IMPRESSION: Interval resolution of previously noted trace left parafalcine subdural hematoma. No acute intracranial process.  COGNITION: Overall cognitive status: Within functional limits for tasks assessed   SENSATION: WFL  COORDINATION: Heel slide: WFL RLE; slight decrease  with LLE   MUSCLE TONE: WFL  POSTURE: rounded shoulders, forward head, and posterior pelvic tilt  LOWER EXTREMITY ROM:     Active  Right Eval Left Eval  Hip flexion    Hip extension    Hip abduction    Hip adduction    Hip internal rotation    Hip external rotation    Knee flexion    Knee extension    Ankle dorsiflexion  -3  Ankle plantarflexion  35  Ankle inversion  10  Ankle eversion  8   (Blank  rows = not tested)  LOWER EXTREMITY MMT:    MMT Right Eval Left Eval  Hip flexion 4 4  Hip extension    Hip abduction 3 3  Hip adduction 3+ 3+  Hip internal rotation    Hip external rotation    Knee flexion 4- 4-  Knee extension 4 4  Ankle dorsiflexion 3 2+  Ankle plantarflexion 3+ 3  Ankle inversion    Ankle eversion    (Blank rows = not tested)  BED MOBILITY:  Not tested  TRANSFERS: Sit to stand: CGA  Assistive device utilized: None     Stand to sit: CGA  Assistive device utilized: None     Chair to chair: CGA  Assistive device utilized: None       RAMP:  Not tested  CURB:  Findings: needs UE support to step up  STAIRS: heavy BUE support step over step negotiation.  GAIT: Findings: Gait Characteristics: decreased stride length, Left foot flat, wide BOS, and poor foot clearance- Left, Distance walked: 60 ft, Assistive device utilized:None, Level of assistance: CGA, and Comments: increased L ankle rolling due to not wearing AFO  FUNCTIONAL TESTS:  5 times sit to stand: 30.53 with 4 posterior LOB 6 minute walk test: perform next session  Berg Balance Scale: 26  PATIENT SURVEYS:  LEFS give next session                                                                                                                               TREATMENT DATE: 03/23/24 ***  TherEx: STS with Airex Pad x10- increased difficulty today, minA required at times due to posterior LOB   4 hedgehogs in a square- pt must walk forward/sideways/backward to tap hedgehog x5; 2  sets  Weighted ambulation forward/backward in hallway with 3# AW; 21ft x6- decreased cadence and higher path variance with backward walking, close CGA-min A for backward walking due to occasional posterior LOB. Cues to increased L hip flexion during backward walking to improve foot clearance  Seated: Adduction squeeze with 3 second isometric x15  GTB HS curl x10 both LE  Toe taps over orange hurdle with x10 each LE     PATIENT EDUCATION: Education details: goals, POC, HEP Person educated: Patient Education method: Explanation, Demonstration, Tactile cues, Verbal cues, and Handouts Education comprehension: verbalized understanding, returned demonstration, verbal cues required, tactile cues required, and needs further education  HOME EXERCISE PROGRAM: Access Code: [email or web address] URL: [email or web address] Date: 12/29/2023 Prepared by: Marina  Moser  Exercises - Leg Extension  - 1 x daily - 7 x weekly - 2 sets - 10 reps - 5 hold - Seated March  - 1 x daily - 7 x weekly - 2 sets - 10 reps - 5 hold - Seated Heel Raise  - 1 x daily - 7 x weekly - 2 sets - 10 reps - 5 hold  GOALS: Goals reviewed with patient? Yes  SHORT TERM GOALS: Target date:  01/26/2024    Patient will be independent in home exercise program to improve strength/mobility for better functional independence with ADLs.  Baseline: Goal status: INITIAL  LONG TERM GOALS: Target date: 03/22/2024    Patient (< 79 years old) will complete five times sit to stand test in < 10 seconds indicating an increased LE strength and improved balance.  Baseline: 30.53 with 4 posterior LOB Goal status: INITIAL  2.   Patient will increase Berg Balance score by >45/56 to demonstrate decreased fall risk during functional activities.  Baseline: 5/5: 26  Goal status: INITIAL  3.    Patient will increase six minute walk test distance to >1200 for progression to age norm community ambulator and improve gait ability  Baseline:  perform next session 5/12: 710 ft  Goal status: INITIAL  4.  Patient will score > 60/80 on LEFS for improved functional mobility and quality of life.  Baseline: perform next session 5/12: 43 Goal status: INITIAL    ASSESSMENT:  CLINICAL IMPRESSION: ***  Pt presented with decreased endurance today, but remained motivated to participate in PT with encouragement provided throughout session. Pt required increased assistance today, especially with backward walking due to more instances of posterior LOB. Pt with tendency to display pusher-like movements during backward walking when challenged with stopping in place, requiring minA to prevent posterior LOB. Pt tolerated all there-x interventions well despite fatigue and denied soreness at end of session. Patient will benefit from skilled physical therapy to improve strength, mobility, and stability.   OBJECTIVE IMPAIRMENTS: Abnormal gait, cardiopulmonary status limiting activity, decreased activity tolerance, decreased balance, decreased coordination, decreased endurance, decreased knowledge of use of DME, decreased mobility, difficulty walking, decreased ROM, decreased strength, dizziness, impaired perceived functional ability, impaired flexibility, impaired vision/preception, improper body mechanics, postural dysfunction, and obesity.   ACTIVITY LIMITATIONS: carrying, lifting, bending, sitting, standing, squatting, stairs, transfers, bed mobility, toileting, dressing, reach over head, hygiene/grooming, locomotion level, and caring for others  PARTICIPATION LIMITATIONS: meal prep, cleaning, laundry, driving, shopping, community activity, occupation, and yard work  PERSONAL FACTORS: Age, Behavior pattern, Fitness, Past/current experiences, Time since onset of injury/illness/exacerbation, Transportation, and 3+ comorbidities: DM, HTN, Anemia, Bells palsy, Hypothyroidism, Kidney stones, stroke and pseudotumour cerebri. Macular degeneration with impaired  visual acuity  are also affecting patient's functional outcome.   REHAB POTENTIAL: Good  CLINICAL DECISION MAKING: Evolving/moderate complexity  EVALUATION COMPLEXITY: Moderate  PLAN:  PT FREQUENCY: 2x/week  PT DURATION: 12 weeks  PLANNED INTERVENTIONS: 97164- PT Re-evaluation, 97750- Physical Performance Testing, 97110-Therapeutic exercises, 97530- Therapeutic activity, [id number]- Neuromuscular re-education, 97535- Self Care, 02859- Manual therapy, [id number]- Gait training, [phone]- Orthotic Initial, [phone]- Orthotic/Prosthetic subsequent, [phone]- Canalith repositioning, [id number]- Splinting, H9716- Electrical stimulation (unattended), [id number]- Electrical stimulation (manual), [id number]- Vasopneumatic device, [id number]- Ultrasound, [id number]- Traction (mechanical), [id number]- Ionotophoresis 4mg /ml Dexamethasone , Patient/Family education, Balance training, Stair training, Taping, Dry Needling, Joint mobilization, Spinal mobilization, Scar mobilization, Compression bandaging, Vestibular training, Visual/preceptual remediation/compensation, Cognitive remediation, DME instructions, Cryotherapy, Moist heat, and Biofeedback  PLAN FOR NEXT SESSION:   balance, strength, L ankle strength, stairs, LE endurance   Maryanne Finder, PT, DPT Physical Therapist - [place]  [facility] 03/23/2024, 4:17 PM

## 2024-03-24 ENCOUNTER — Other Ambulatory Visit: Payer: Self-pay | Admitting: Internal Medicine

## 2024-03-24 ENCOUNTER — Ambulatory Visit

## 2024-03-24 DIAGNOSIS — R278 Other lack of coordination: Secondary | ICD-10-CM

## 2024-03-24 DIAGNOSIS — R262 Difficulty in walking, not elsewhere classified: Secondary | ICD-10-CM

## 2024-03-24 DIAGNOSIS — R2681 Unsteadiness on feet: Secondary | ICD-10-CM

## 2024-03-24 DIAGNOSIS — R531 Weakness: Secondary | ICD-10-CM

## 2024-03-24 DIAGNOSIS — N186 End stage renal disease: Secondary | ICD-10-CM

## 2024-03-24 DIAGNOSIS — I2510 Atherosclerotic heart disease of native coronary artery without angina pectoris: Secondary | ICD-10-CM

## 2024-03-24 DIAGNOSIS — R2689 Other abnormalities of gait and mobility: Secondary | ICD-10-CM

## 2024-03-24 DIAGNOSIS — M6281 Muscle weakness (generalized): Secondary | ICD-10-CM

## 2024-03-24 DIAGNOSIS — R269 Unspecified abnormalities of gait and mobility: Secondary | ICD-10-CM

## 2024-03-24 NOTE — Progress Notes (Signed)
 Received notification that I needed to reorder PT. Order placed for PT.

## 2024-03-26 ENCOUNTER — Other Ambulatory Visit

## 2024-03-29 ENCOUNTER — Ambulatory Visit: Attending: Internal Medicine

## 2024-03-29 DIAGNOSIS — R2689 Other abnormalities of gait and mobility: Secondary | ICD-10-CM | POA: Diagnosis present

## 2024-03-29 DIAGNOSIS — R278 Other lack of coordination: Secondary | ICD-10-CM | POA: Diagnosis present

## 2024-03-29 DIAGNOSIS — I2510 Atherosclerotic heart disease of native coronary artery without angina pectoris: Secondary | ICD-10-CM | POA: Diagnosis not present

## 2024-03-29 DIAGNOSIS — R531 Weakness: Secondary | ICD-10-CM | POA: Insufficient documentation

## 2024-03-29 DIAGNOSIS — R269 Unspecified abnormalities of gait and mobility: Secondary | ICD-10-CM | POA: Diagnosis present

## 2024-03-29 DIAGNOSIS — R2681 Unsteadiness on feet: Secondary | ICD-10-CM | POA: Diagnosis present

## 2024-03-29 DIAGNOSIS — R262 Difficulty in walking, not elsewhere classified: Secondary | ICD-10-CM | POA: Diagnosis present

## 2024-03-29 DIAGNOSIS — N186 End stage renal disease: Secondary | ICD-10-CM | POA: Diagnosis not present

## 2024-03-29 DIAGNOSIS — M6281 Muscle weakness (generalized): Secondary | ICD-10-CM | POA: Diagnosis present

## 2024-03-29 NOTE — Therapy (Addendum)
 OUTPATIENT PHYSICAL THERAPY NEURO TREATMENT/RE-CERT   Patient Name: Gregory Crane MRN: [id number] DOB:1971-08-05, 53 y.o., male Today's Date: 03/29/2024   PCP: Glendia Shad  REFERRING PROVIDER: Glendia Shad  END OF SESSION:  PT End of Session - 03/29/24 1020     Visit Number 15    Number of Visits 39    Date for PT Re-Evaluation 06/21/24    Authorization Type --    Authorization Time Period --    PT Start Time 1015    PT Stop Time 1100    PT Time Calculation (min) 45 min    Equipment Utilized During Treatment Gait belt    Activity Tolerance Patient tolerated treatment well    Behavior During Therapy WFL for tasks assessed/performed           Past Medical History:  Diagnosis Date   Allergy    Anemia    Bell's palsy    Diabetes mellitus without complication (HCC)    diet controlled   Fall 07/2023   Hypertension    Hypothyroidism    Kidney stones    Pseudotumor cerebri    Stroke [facility])    Past Surgical History:  Procedure Laterality Date   COLONOSCOPY WITH PROPOFOL  N/A 03/30/2020   Procedure: COLONOSCOPY WITH PROPOFOL ;  Surgeon: Maryruth Ole DASEN, MD;  Location: [facility] ENDOSCOPY;  Service: Endoscopy;  Laterality: N/A;   CORONARY STENT INTERVENTION N/A 03/17/2024   Procedure: CORONARY STENT INTERVENTION;  Surgeon: Swaziland, Peter M, MD;  Location: [facility] INVASIVE CV LAB;  Service: Cardiovascular;  Laterality: N/A;   LEFT HEART CATH AND CORONARY ANGIOGRAPHY N/A 03/17/2024   Procedure: LEFT HEART CATH AND CORONARY ANGIOGRAPHY;  Surgeon: Swaziland, Peter M, MD;  Location: [facility] INVASIVE CV LAB;  Service: Cardiovascular;  Laterality: N/A;   LOOP RECORDER INSERTION N/A 01/27/2018   Procedure: LOOP RECORDER INSERTION;  Surgeon: Fernande Elspeth BROCKS, MD;  Location: [facility] INVASIVE CV LAB;  Service: Cardiovascular;  Laterality: N/A;   LUMBAR PUNCTURE     as child   NO PAST SURGERIES     REMOVAL OF A DIALYSIS CATHETER     TEE WITHOUT CARDIOVERSION N/A 01/07/2018   Procedure:  TRANSESOPHAGEAL ECHOCARDIOGRAM (TEE);  Surgeon: Perla Evalene PARAS, MD;  Location: [facility] ORS;  Service: Cardiovascular;  Laterality: N/A;   Patient Active Problem List   Diagnosis Date Noted   CAD S/P percutaneous coronary angioplasty 03/17/2024   Non-ST elevation (NSTEMI) myocardial infarction (HCC) 03/16/2024   Hypervolemia 03/14/2024   Hypoxic respiratory failure (HCC) 03/14/2024   Demand ischemia (HCC) 03/14/2024   Left hip pain 01/30/2024   Acute on chronic heart failure with preserved ejection fraction (HFpEF) (HCC) 12/26/2023   Community acquired pneumonia 12/24/2023   Acute respiratory failure with hypoxia (HCC) 12/23/2023   SVT (supraventricular tachycardia) (HCC) 08/10/2023   Atrial fibrillation with rapid ventricular response (HCC) 07/03/2023   Atrial fibrillation with RVR (HCC) 07/02/2023   Fall 06/15/2023   Non-compliance with renal dialysis (HCC) 05/02/2023   Obesity (BMI 30-39.9) 04/30/2023   Volume overload 04/29/2023   Renal osteodystrophy 11/25/2022   Unsteady gait 10/05/2022   Steal syndrome of dialysis vascular access (HCC) 05/28/2022   Hydronephrosis, left 05/04/2022   Leukocytosis 05/04/2022   Chest pain 01/13/2022   Diabetic retinopathy associated with diabetes mellitus due to underlying condition (HCC) 01/13/2022   ESRD on hemodialysis (HCC) 01/13/2022   Deafness in right ear 08/11/2021   Open wound 01/25/2021   History of colon polyps 10/15/2020   Acute cholecystitis without calculus 09/09/2020  Type 2 diabetes mellitus, with long-term current use of insulin  (HCC) 09/09/2020   Cryptogenic stroke (HCC) 07/13/2020   History of loop recorder 07/13/2020   Elevated troponin 04/03/2020   Acquired trigger finger 06/08/2019   Lymphedema 06/08/2019   Anemia 04/17/2019   Swelling of both lower extremities 01/10/2019   Facial droop 04/09/2018   Daytime somnolence 03/30/2018   Carotid artery disease (HCC) 02/03/2018   Intracranial vascular stenosis 09/12/2017    Cough 01/20/2017   Bell's palsy 11/10/2016   History of CVA (cerebrovascular accident) 11/10/2016   Benign localized hyperplasia of prostate with urinary obstruction 10/27/2016   History of nephrolithiasis 10/27/2016   TIA (transient ischemic attack) 10/20/2016   Near syncope 06/23/2016   Organic impotence 10/01/2015   Neuropathy 08/06/2015   Health care maintenance 08/06/2015   Hypertension 08/06/2015   Hypothyroidism 10/10/2013   Microalbuminuria 10/10/2013   Hyperlipidemia 10/10/2013   B12 deficiency 10/10/2013   Environmental allergies 07/04/2013    ONSET DATE: 3 years ago  REFERRING DIAG: unsteady gait  THERAPY DIAG:  Muscle weakness (generalized)  Unsteadiness on feet  Other abnormalities of gait and mobility  Difficulty in walking, not elsewhere classified  Abnormality of gait and mobility  Other lack of coordination  Rationale for Evaluation and Treatment: Rehabilitation  SUBJECTIVE:                                                                                                                                                                                             SUBJECTIVE STATEMENT:   Pt reports feeling weak today, but otherwise is doing well.  Pt feels as though his recovery has been a lot slower than he anticpated.      Pt accompanied by: self  PERTINENT HISTORY: Patient returning to PT s/p hospitalization for pneumonia, , SVT, ESRD on hypodialysis. PMH DM, HTN, Anemia, Bells palsy, Hypothyroidism, Kidney stones, stroke and pseudotumour cerebri. Macular degeneration with impaired visual acuity   PAIN:  Are you having pain? Some L foot numbness/neuropathy  PRECAUTIONS: Fall  RED FLAGS: None   WEIGHT BEARING RESTRICTIONS: No  FALLS: Has patient fallen in last 6 months? No  LIVING ENVIRONMENT: Lives with: lives alone Lives in: House/apartment Stairs: yes stairs in house Has following equipment at home: Single point cane  PLOF:  Independent with basic ADLs  PATIENT GOALS: balance, strength  OBJECTIVE:  Note: Objective measures were completed at Evaluation unless otherwise noted.  DIAGNOSTIC FINDINGS: IMPRESSION: Interval resolution of previously noted trace left parafalcine subdural hematoma. No acute intracranial process.  COGNITION: Overall cognitive status: Within functional limits for tasks assessed   SENSATION: WFL  COORDINATION:  Heel slide: WFL RLE; slight decrease with LLE   MUSCLE TONE: WFL  POSTURE: rounded shoulders, forward head, and posterior pelvic tilt  LOWER EXTREMITY ROM:     Active  Right Eval Left Eval  Hip flexion    Hip extension    Hip abduction    Hip adduction    Hip internal rotation    Hip external rotation    Knee flexion    Knee extension    Ankle dorsiflexion  -3  Ankle plantarflexion  35  Ankle inversion  10  Ankle eversion  8   (Blank rows = not tested)  LOWER EXTREMITY MMT:    MMT Right Eval Left Eval  Hip flexion 4 4  Hip extension    Hip abduction 3 3  Hip adduction 3+ 3+  Hip internal rotation    Hip external rotation    Knee flexion 4- 4-  Knee extension 4 4  Ankle dorsiflexion 3 2+  Ankle plantarflexion 3+ 3  Ankle inversion    Ankle eversion    (Blank rows = not tested)  BED MOBILITY:  Not tested  TRANSFERS: Sit to stand: CGA  Assistive device utilized: None     Stand to sit: CGA  Assistive device utilized: None     Chair to chair: CGA  Assistive device utilized: None       RAMP:  Not tested  CURB:  Findings: needs UE support to step up  STAIRS: heavy BUE support step over step negotiation.  GAIT: Findings: Gait Characteristics: decreased stride length, Left foot flat, wide BOS, and poor foot clearance- Left, Distance walked: 60 ft, Assistive device utilized:None, Level of assistance: CGA, and Comments: increased L ankle rolling due to not wearing AFO  FUNCTIONAL TESTS:  5 times sit to stand: 30.53 with 4 posterior LOB 6  minute walk test: perform next session  Berg Balance Scale: 26  PATIENT SURVEYS:  LEFS give next session                                                                                                                               TREATMENT DATE: 03/29/24  Physical Performance Testing  Five times Sit to Stand Test (FTSS)  TIME: 22.61 sec  Cut off scores indicative of increased fall risk: >12 sec CVA, >16 sec PD, >13 sec vestibular (ANPTA Core Set of Outcome Measures for Adults with Neurologic Conditions, 2018)  6 Min Walk Test:  Instructed patient to ambulate as quickly and as safely as possible for 6 minutes using LRAD. Patient was allowed to take standing rest breaks without stopping the test, but if the patient required a sitting rest break the clock would be stopped and the test would be over.  Results: 770 feet using no AD with 3 standing rest breaks. Results indicate that the patient has reduced endurance with ambulation compared to age matched norms.  Age Matched Norms (in meters): 22-69 yo M: 24 F:  538, 71-79 yo M: 55 F: 471, 73-89 yo M: 417 F: 392 MDC: 58.21 meters (190.98 feet) or 50 meters (ANPTA Core Set of Outcome Measures for Adults with Neurologic Conditions, 2018)  LEFS questionnaire performed and noted below, with therapist having to read the questions due to pt's inability to read the smaller print.  Patient demonstrates increased fall risk as noted by score of  32 /56 on Berg Balance Scale.  (<36= high risk for falls, close to 100%; 37-45 significant >80%; 46-51 moderate >50%; 52-55 lower >25%)   PATIENT EDUCATION: Education details: goals, POC, HEP Person educated: Patient Education method: Explanation, Demonstration, Tactile cues, Verbal cues, and Handouts Education comprehension: verbalized understanding, returned demonstration, verbal cues required, tactile cues required, and needs further education  HOME EXERCISE PROGRAM: Access Code: [email or web address] URL:  [email or web address] Date: 12/29/2023 Prepared by: Marina  Moser  Exercises - Leg Extension  - 1 x daily - 7 x weekly - 2 sets - 10 reps - 5 hold - Seated March  - 1 x daily - 7 x weekly - 2 sets - 10 reps - 5 hold - Seated Heel Raise  - 1 x daily - 7 x weekly - 2 sets - 10 reps - 5 hold  GOALS: Goals reviewed with patient? Yes  SHORT TERM GOALS: Target date: 01/26/2024    Patient will be independent in home exercise program to improve strength/mobility for better functional independence with ADLs.  Baseline: Goal status: INITIAL  LONG TERM GOALS: Target date: 03/22/2024    Patient (< 61 years old) will complete five times sit to stand test in < 10 seconds indicating an increased LE strength and improved balance.  Baseline: 30.53 with 4 posterior LOB 03/29/24: 22.61 with 3 posterior LOB Goal status: PROGRESSING  2.   Patient will increase Berg Balance score by >45/56 to demonstrate decreased fall risk during functional activities.  Baseline: 5/5: 26  03/29/24: 32 Goal status: INITIAL  3.    Patient will increase six minute walk test distance to >1200 for progression to age norm community ambulator and improve gait ability  Baseline: perform next session 5/12: 710 ft  03/29/24: 770' Goal status: PROGRESSING  4.  Patient will score > 60/80 on LEFS for improved functional mobility and quality of life.  Baseline: perform next session 5/12: 43 03/29/24: 39/80 Goal status: INITIAL    ASSESSMENT:  CLINICAL IMPRESSION:   Pt seen for re-certification due to being admitted to the hospital and being outside of prior certification.  Pt assessed for goals and found to have progress since the initial evaluation, however still have significant balance difficulties at this time.  Patient's condition has the potential to improve in response to therapy. Maximum improvement is yet to be obtained. The anticipated improvement is attainable and reasonable in a generally predictable  time.   Pt will continue to benefit from skilled therapy to address remaining deficits in order to improve overall QoL and return to PLOF.       OBJECTIVE IMPAIRMENTS: Abnormal gait, cardiopulmonary status limiting activity, decreased activity tolerance, decreased balance, decreased coordination, decreased endurance, decreased knowledge of use of DME, decreased mobility, difficulty walking, decreased ROM, decreased strength, dizziness, impaired perceived functional ability, impaired flexibility, impaired vision/preception, improper body mechanics, postural dysfunction, and obesity.   ACTIVITY LIMITATIONS: carrying, lifting, bending, sitting, standing, squatting, stairs, transfers, bed mobility, toileting, dressing, reach over head, hygiene/grooming, locomotion level, and caring for others  PARTICIPATION LIMITATIONS: meal prep, cleaning, laundry, driving, shopping, community activity, occupation, and yard  work  PERSONAL FACTORS: Age, Behavior pattern, Fitness, Past/current experiences, Time since onset of injury/illness/exacerbation, Transportation, and 3+ comorbidities: DM, HTN, Anemia, Bells palsy, Hypothyroidism, Kidney stones, stroke and pseudotumour cerebri. Macular degeneration with impaired visual acuity  are also affecting patient's functional outcome.   REHAB POTENTIAL: Good  CLINICAL DECISION MAKING: Evolving/moderate complexity  EVALUATION COMPLEXITY: Moderate  PLAN:  PT FREQUENCY: 2x/week  PT DURATION: 12 weeks  PLANNED INTERVENTIONS: 97164- PT Re-evaluation, 97750- Physical Performance Testing, 97110-Therapeutic exercises, 97530- Therapeutic activity, [id number]- Neuromuscular re-education, 97535- Self Care, [id number]- Manual therapy, [id number]- Gait training, [phone]- Orthotic Initial, [phone]- Orthotic/Prosthetic subsequent, [phone]- Canalith repositioning, [id number]- Splinting, [id number]- Electrical stimulation (unattended), [phone]- Electrical stimulation (manual), [id number]- Vasopneumatic device, [id number]-  Ultrasound, [id number]- Traction (mechanical), [id number]- Ionotophoresis 4mg /ml Dexamethasone , Patient/Family education, Balance training, Stair training, Taping, Dry Needling, Joint mobilization, Spinal mobilization, Scar mobilization, Compression bandaging, Vestibular training, Visual/preceptual remediation/compensation, Cognitive remediation, DME instructions, Cryotherapy, Moist heat, and Biofeedback  PLAN FOR NEXT SESSION:   balance, strength, L ankle strength, stairs, LE endurance   Fonda Simpers, PT, DPT Physical Therapist - [facility]  [facility]  03/29/24, 10:22 AM

## 2024-03-29 NOTE — Addendum Note (Signed)
 Addended by: SILVER FONDA ORN on: 03/29/2024 11:34 AM   Modules accepted: Orders

## 2024-03-30 LAB — HM DIABETES EYE EXAM

## 2024-03-31 ENCOUNTER — Ambulatory Visit

## 2024-03-31 DIAGNOSIS — R262 Difficulty in walking, not elsewhere classified: Secondary | ICD-10-CM

## 2024-03-31 DIAGNOSIS — M6281 Muscle weakness (generalized): Secondary | ICD-10-CM

## 2024-03-31 DIAGNOSIS — R269 Unspecified abnormalities of gait and mobility: Secondary | ICD-10-CM

## 2024-03-31 DIAGNOSIS — R2681 Unsteadiness on feet: Secondary | ICD-10-CM

## 2024-03-31 DIAGNOSIS — R278 Other lack of coordination: Secondary | ICD-10-CM

## 2024-03-31 DIAGNOSIS — R2689 Other abnormalities of gait and mobility: Secondary | ICD-10-CM

## 2024-03-31 NOTE — Therapy (Signed)
 OUTPATIENT PHYSICAL THERAPY NEURO TREATMENT   Patient Name: Gregory Crane MRN: [id number] DOB:05-15-1971, 53 y.o., male Today's Date: 03/31/2024   PCP: Glendia Shad  REFERRING PROVIDER: Glendia Shad  END OF SESSION:  PT End of Session - 03/31/24 1018     Visit Number 16    Number of Visits 39    Date for PT Re-Evaluation 06/21/24    PT Start Time 1018    PT Stop Time 1100    PT Time Calculation (min) 42 min    Equipment Utilized During Treatment Gait belt    Activity Tolerance Patient tolerated treatment well    Behavior During Therapy WFL for tasks assessed/performed           Past Medical History:  Diagnosis Date   Allergy    Anemia    Bell's palsy    Diabetes mellitus without complication (HCC)    diet controlled   Fall 07/2023   Hypertension    Hypothyroidism    Kidney stones    Pseudotumor cerebri    Stroke [facility])    Past Surgical History:  Procedure Laterality Date   COLONOSCOPY WITH PROPOFOL  N/A 03/30/2020   Procedure: COLONOSCOPY WITH PROPOFOL ;  Surgeon: Maryruth Ole DASEN, MD;  Location: [facility] ENDOSCOPY;  Service: Endoscopy;  Laterality: N/A;   CORONARY STENT INTERVENTION N/A 03/17/2024   Procedure: CORONARY STENT INTERVENTION;  Surgeon: Swaziland, Peter M, MD;  Location: [facility] INVASIVE CV LAB;  Service: Cardiovascular;  Laterality: N/A;   LEFT HEART CATH AND CORONARY ANGIOGRAPHY N/A 03/17/2024   Procedure: LEFT HEART CATH AND CORONARY ANGIOGRAPHY;  Surgeon: Swaziland, Peter M, MD;  Location: [facility] INVASIVE CV LAB;  Service: Cardiovascular;  Laterality: N/A;   LOOP RECORDER INSERTION N/A 01/27/2018   Procedure: LOOP RECORDER INSERTION;  Surgeon: Fernande Elspeth BROCKS, MD;  Location: [facility] INVASIVE CV LAB;  Service: Cardiovascular;  Laterality: N/A;   LUMBAR PUNCTURE     as child   NO PAST SURGERIES     REMOVAL OF A DIALYSIS CATHETER     TEE WITHOUT CARDIOVERSION N/A 01/07/2018   Procedure: TRANSESOPHAGEAL ECHOCARDIOGRAM (TEE);  Surgeon: Perla Evalene PARAS, MD;   Location: [facility] ORS;  Service: Cardiovascular;  Laterality: N/A;   Patient Active Problem List   Diagnosis Date Noted   CAD S/P percutaneous coronary angioplasty 03/17/2024   Non-ST elevation (NSTEMI) myocardial infarction (HCC) 03/16/2024   Hypervolemia 03/14/2024   Hypoxic respiratory failure (HCC) 03/14/2024   Demand ischemia (HCC) 03/14/2024   Left hip pain 01/30/2024   Acute on chronic heart failure with preserved ejection fraction (HFpEF) (HCC) 12/26/2023   Community acquired pneumonia 12/24/2023   Acute respiratory failure with hypoxia (HCC) 12/23/2023   SVT (supraventricular tachycardia) (HCC) 08/10/2023   Atrial fibrillation with rapid ventricular response (HCC) 07/03/2023   Atrial fibrillation with RVR (HCC) 07/02/2023   Fall 06/15/2023   Non-compliance with renal dialysis (HCC) 05/02/2023   Obesity (BMI 30-39.9) 04/30/2023   Volume overload 04/29/2023   Renal osteodystrophy 11/25/2022   Unsteady gait 10/05/2022   Steal syndrome of dialysis vascular access (HCC) 05/28/2022   Hydronephrosis, left 05/04/2022   Leukocytosis 05/04/2022   Chest pain 01/13/2022   Diabetic retinopathy associated with diabetes mellitus due to underlying condition (HCC) 01/13/2022   ESRD on hemodialysis (HCC) 01/13/2022   Deafness in right ear 08/11/2021   Open wound 01/25/2021   History of colon polyps 10/15/2020   Acute cholecystitis without calculus 09/09/2020   Type 2 diabetes mellitus, with long-term current use of insulin  (HCC) 09/09/2020  Cryptogenic stroke (HCC) 07/13/2020   History of loop recorder 07/13/2020   Elevated troponin 04/03/2020   Acquired trigger finger 06/08/2019   Lymphedema 06/08/2019   Anemia 04/17/2019   Swelling of both lower extremities 01/10/2019   Facial droop 04/09/2018   Daytime somnolence 03/30/2018   Carotid artery disease (HCC) 02/03/2018   Intracranial vascular stenosis 09/12/2017   Cough 01/20/2017   Bell's palsy 11/10/2016   History of CVA  (cerebrovascular accident) 11/10/2016   Benign localized hyperplasia of prostate with urinary obstruction 10/27/2016   History of nephrolithiasis 10/27/2016   TIA (transient ischemic attack) 10/20/2016   Near syncope 06/23/2016   Organic impotence 10/01/2015   Neuropathy 08/06/2015   Health care maintenance 08/06/2015   Hypertension 08/06/2015   Hypothyroidism 10/10/2013   Microalbuminuria 10/10/2013   Hyperlipidemia 10/10/2013   B12 deficiency 10/10/2013   Environmental allergies 07/04/2013    ONSET DATE: 3 years ago  REFERRING DIAG: unsteady gait  THERAPY DIAG:  Muscle weakness (generalized)  Unsteadiness on feet  Other abnormalities of gait and mobility  Difficulty in walking, not elsewhere classified  Abnormality of gait and mobility  Other lack of coordination  Rationale for Evaluation and Treatment: Rehabilitation  SUBJECTIVE:                                                                                                                                                                                             SUBJECTIVE STATEMENT:   Pt denies anything new since the last visit.  Pt reports he was hoping the rain would stop soon, or at least not downpour when he leaves.  Pt asking about his referral to cardiac rehab.  Therapist will attempt to see if pt is eligible for both in the future.   Pt accompanied by: self  PERTINENT HISTORY: Patient returning to PT s/p hospitalization for pneumonia, , SVT, ESRD on hypodialysis. PMH DM, HTN, Anemia, Bells palsy, Hypothyroidism, Kidney stones, stroke and pseudotumour cerebri. Macular degeneration with impaired visual acuity   PAIN:  Are you having pain? Some L foot numbness/neuropathy  PRECAUTIONS: Fall  RED FLAGS: None   WEIGHT BEARING RESTRICTIONS: No  FALLS: Has patient fallen in last 6 months? No  LIVING ENVIRONMENT: Lives with: lives alone Lives in: House/apartment Stairs: yes stairs in house Has  following equipment at home: Single point cane  PLOF: Independent with basic ADLs  PATIENT GOALS: balance, strength  OBJECTIVE:  Note: Objective measures were completed at Evaluation unless otherwise noted.  DIAGNOSTIC FINDINGS: IMPRESSION: Interval resolution of previously noted trace left parafalcine subdural hematoma. No acute intracranial process.  COGNITION: Overall cognitive status: Within functional limits  for tasks assessed   SENSATION: WFL  COORDINATION: Heel slide: WFL RLE; slight decrease with LLE   MUSCLE TONE: WFL  POSTURE: rounded shoulders, forward head, and posterior pelvic tilt  LOWER EXTREMITY ROM:     Active  Right Eval Left Eval  Hip flexion    Hip extension    Hip abduction    Hip adduction    Hip internal rotation    Hip external rotation    Knee flexion    Knee extension    Ankle dorsiflexion  -3  Ankle plantarflexion  35  Ankle inversion  10  Ankle eversion  8   (Blank rows = not tested)  LOWER EXTREMITY MMT:    MMT Right Eval Left Eval  Hip flexion 4 4  Hip extension    Hip abduction 3 3  Hip adduction 3+ 3+  Hip internal rotation    Hip external rotation    Knee flexion 4- 4-  Knee extension 4 4  Ankle dorsiflexion 3 2+  Ankle plantarflexion 3+ 3  Ankle inversion    Ankle eversion    (Blank rows = not tested)  BED MOBILITY:  Not tested  TRANSFERS: Sit to stand: CGA  Assistive device utilized: None     Stand to sit: CGA  Assistive device utilized: None     Chair to chair: CGA  Assistive device utilized: None       RAMP:  Not tested  CURB:  Findings: needs UE support to step up  STAIRS: heavy BUE support step over step negotiation.  GAIT: Findings: Gait Characteristics: decreased stride length, Left foot flat, wide BOS, and poor foot clearance- Left, Distance walked: 60 ft, Assistive device utilized:None, Level of assistance: CGA, and Comments: increased L ankle rolling due to not wearing AFO  FUNCTIONAL  TESTS:  5 times sit to stand: 30.53 with 4 posterior LOB 6 minute walk test: perform next session  Berg Balance Scale: 26  PATIENT SURVEYS:  LEFS give next session                                                                                                                               TREATMENT DATE: 03/31/24   TherAct:   Sit to stands with airex pad under feet: 10x; close CGA due to posterior LOB ; pt requires UE use for all 10 attempts and experienced 3 LOB backwards    Step up onto first step of stairs, x10 each LE leading with intermittent UE support, no Ue support on the final 3 of each set    Neuro Re-ed:   BAPS board: LLE -static hold 30 seconds;  -30 seconds hold and 10 df/pf ; 2 sets -30 seconds hold and 10x  inversion/eversion 2 sets -circles / 30 seconds each direction    TherEx:  Seated leg press, 70#, 2x10 Standing squats with intermittent UE support in // bars, 2x10 Standing hip abduction, 2x10 each LE  PATIENT EDUCATION: Education details: goals, POC, HEP Person educated: Patient Education method: Explanation, Demonstration, Tactile cues, Verbal cues, and Handouts Education comprehension: verbalized understanding, returned demonstration, verbal cues required, tactile cues required, and needs further education  HOME EXERCISE PROGRAM: Access Code: [email or web address] URL: [email or web address] Date: 12/29/2023 Prepared by: Marina  Moser  Exercises - Leg Extension  - 1 x daily - 7 x weekly - 2 sets - 10 reps - 5 hold - Seated March  - 1 x daily - 7 x weekly - 2 sets - 10 reps - 5 hold - Seated Heel Raise  - 1 x daily - 7 x weekly - 2 sets - 10 reps - 5 hold  GOALS: Goals reviewed with patient? Yes  SHORT TERM GOALS: Target date: 01/26/2024    Patient will be independent in home exercise program to improve strength/mobility for better functional independence with ADLs.  Baseline: Goal status: INITIAL  LONG TERM GOALS: Target date:  03/22/2024    Patient (< 27 years old) will complete five times sit to stand test in < 10 seconds indicating an increased LE strength and improved balance.  Baseline: 30.53 with 4 posterior LOB 03/29/24: 22.61 with 3 posterior LOB Goal status: PROGRESSING  2.   Patient will increase Berg Balance score by >45/56 to demonstrate decreased fall risk during functional activities.  Baseline: 5/5: 26  03/29/24: 32 Goal status: INITIAL  3.    Patient will increase six minute walk test distance to >1200 for progression to age norm community ambulator and improve gait ability  Baseline: perform next session 5/12: 710 ft  03/29/24: 770' Goal status: PROGRESSING  4.  Patient will score > 60/80 on LEFS for improved functional mobility and quality of life.  Baseline: perform next session 5/12: 43 03/29/24: 39/80 Goal status: INITIAL    ASSESSMENT:  CLINICAL IMPRESSION:   Pt responded well to the exercises, however is still having significant posterior balance complications when coming into standing position.  Pt encouraged throughout the session to keep weight over toes, and pt is able to demonstrate ability, however has little carryover when moving to the next exercise.  Pt encouraged to keep weight forward at home when performing exercises at home   Pt will continue to benefit from skilled therapy to address remaining deficits in order to improve overall QoL and return to PLOF.        OBJECTIVE IMPAIRMENTS: Abnormal gait, cardiopulmonary status limiting activity, decreased activity tolerance, decreased balance, decreased coordination, decreased endurance, decreased knowledge of use of DME, decreased mobility, difficulty walking, decreased ROM, decreased strength, dizziness, impaired perceived functional ability, impaired flexibility, impaired vision/preception, improper body mechanics, postural dysfunction, and obesity.   ACTIVITY LIMITATIONS: carrying, lifting, bending, sitting, standing, squatting,  stairs, transfers, bed mobility, toileting, dressing, reach over head, hygiene/grooming, locomotion level, and caring for others  PARTICIPATION LIMITATIONS: meal prep, cleaning, laundry, driving, shopping, community activity, occupation, and yard work  PERSONAL FACTORS: Age, Behavior pattern, Fitness, Past/current experiences, Time since onset of injury/illness/exacerbation, Transportation, and 3+ comorbidities: DM, HTN, Anemia, Bells palsy, Hypothyroidism, Kidney stones, stroke and pseudotumour cerebri. Macular degeneration with impaired visual acuity  are also affecting patient's functional outcome.   REHAB POTENTIAL: Good  CLINICAL DECISION MAKING: Evolving/moderate complexity  EVALUATION COMPLEXITY: Moderate  PLAN:  PT FREQUENCY: 2x/week  PT DURATION: 12 weeks  PLANNED INTERVENTIONS: 97164- PT Re-evaluation, 97750- Physical Performance Testing, 97110-Therapeutic exercises, 97530- Therapeutic activity, [id number]- Neuromuscular re-education, 97535- Self Care, 02859- Manual therapy, [id number]- Gait training, [id number]- Orthotic Initial, [id number]- Orthotic/Prosthetic subsequent,  04007- Canalith repositioning, 02239- Splinting, [id number]- Electrical stimulation (unattended), [id number]- Electrical stimulation (manual), [id number]- Vasopneumatic device, [id number]- Ultrasound, [id number]- Traction (mechanical), [id number]- Ionotophoresis 4mg /ml Dexamethasone , Patient/Family education, Balance training, Stair training, Taping, Dry Needling, Joint mobilization, Spinal mobilization, Scar mobilization, Compression bandaging, Vestibular training, Visual/preceptual remediation/compensation, Cognitive remediation, DME instructions, Cryotherapy, Moist heat, and Biofeedback  PLAN FOR NEXT SESSION:   balance, strength, L ankle strength, stairs, LE endurance   Fonda Simpers, PT, DPT Physical Therapist - [place]  [facility]  03/31/24, 10:59 AM

## 2024-04-05 ENCOUNTER — Ambulatory Visit

## 2024-04-05 DIAGNOSIS — R269 Unspecified abnormalities of gait and mobility: Secondary | ICD-10-CM

## 2024-04-05 DIAGNOSIS — R262 Difficulty in walking, not elsewhere classified: Secondary | ICD-10-CM

## 2024-04-05 DIAGNOSIS — R2689 Other abnormalities of gait and mobility: Secondary | ICD-10-CM

## 2024-04-05 DIAGNOSIS — R2681 Unsteadiness on feet: Secondary | ICD-10-CM

## 2024-04-05 DIAGNOSIS — M6281 Muscle weakness (generalized): Secondary | ICD-10-CM

## 2024-04-05 DIAGNOSIS — R278 Other lack of coordination: Secondary | ICD-10-CM

## 2024-04-05 NOTE — Therapy (Signed)
 OUTPATIENT PHYSICAL THERAPY NEURO TREATMENT   Patient Name: Gregory Crane MRN: [id number] DOB:Sep 28, 1970, 53 y.o., male Today's Date: 04/05/2024   PCP: Glendia Shad  REFERRING PROVIDER: Glendia Shad  END OF SESSION:  PT End of Session - 04/05/24 1018     Visit Number 17    Number of Visits 39    Date for PT Re-Evaluation 06/21/24    PT Start Time 1018    PT Stop Time 1100    PT Time Calculation (min) 42 min    Equipment Utilized During Treatment Gait belt    Activity Tolerance Patient tolerated treatment well    Behavior During Therapy WFL for tasks assessed/performed         Past Medical History:  Diagnosis Date   Allergy    Anemia    Bell's palsy    Diabetes mellitus without complication (HCC)    diet controlled   Fall 07/2023   Hypertension    Hypothyroidism    Kidney stones    Pseudotumor cerebri    Stroke [facility])    Past Surgical History:  Procedure Laterality Date   COLONOSCOPY WITH PROPOFOL  N/A 03/30/2020   Procedure: COLONOSCOPY WITH PROPOFOL ;  Surgeon: Maryruth Ole DASEN, MD;  Location: [facility] ENDOSCOPY;  Service: Endoscopy;  Laterality: N/A;   CORONARY STENT INTERVENTION N/A 03/17/2024   Procedure: CORONARY STENT INTERVENTION;  Surgeon: Swaziland, Peter M, MD;  Location: [facility] INVASIVE CV LAB;  Service: Cardiovascular;  Laterality: N/A;   LEFT HEART CATH AND CORONARY ANGIOGRAPHY N/A 03/17/2024   Procedure: LEFT HEART CATH AND CORONARY ANGIOGRAPHY;  Surgeon: Swaziland, Peter M, MD;  Location: [facility] INVASIVE CV LAB;  Service: Cardiovascular;  Laterality: N/A;   LOOP RECORDER INSERTION N/A 01/27/2018   Procedure: LOOP RECORDER INSERTION;  Surgeon: Fernande Elspeth BROCKS, MD;  Location: [facility] INVASIVE CV LAB;  Service: Cardiovascular;  Laterality: N/A;   LUMBAR PUNCTURE     as child   NO PAST SURGERIES     REMOVAL OF A DIALYSIS CATHETER     TEE WITHOUT CARDIOVERSION N/A 01/07/2018   Procedure: TRANSESOPHAGEAL ECHOCARDIOGRAM (TEE);  Surgeon: Perla Evalene PARAS, MD;   Location: [facility] ORS;  Service: Cardiovascular;  Laterality: N/A;   Patient Active Problem List   Diagnosis Date Noted   CAD S/P percutaneous coronary angioplasty 03/17/2024   Non-ST elevation (NSTEMI) myocardial infarction (HCC) 03/16/2024   Hypervolemia 03/14/2024   Hypoxic respiratory failure (HCC) 03/14/2024   Demand ischemia (HCC) 03/14/2024   Left hip pain 01/30/2024   Acute on chronic heart failure with preserved ejection fraction (HFpEF) (HCC) 12/26/2023   Community acquired pneumonia 12/24/2023   Acute respiratory failure with hypoxia (HCC) 12/23/2023   SVT (supraventricular tachycardia) (HCC) 08/10/2023   Atrial fibrillation with rapid ventricular response (HCC) 07/03/2023   Atrial fibrillation with RVR (HCC) 07/02/2023   Fall 06/15/2023   Non-compliance with renal dialysis (HCC) 05/02/2023   Obesity (BMI 30-39.9) 04/30/2023   Volume overload 04/29/2023   Renal osteodystrophy 11/25/2022   Unsteady gait 10/05/2022   Steal syndrome of dialysis vascular access (HCC) 05/28/2022   Hydronephrosis, left 05/04/2022   Leukocytosis 05/04/2022   Chest pain 01/13/2022   Diabetic retinopathy associated with diabetes mellitus due to underlying condition (HCC) 01/13/2022   ESRD on hemodialysis (HCC) 01/13/2022   Deafness in right ear 08/11/2021   Open wound 01/25/2021   History of colon polyps 10/15/2020   Acute cholecystitis without calculus 09/09/2020   Type 2 diabetes mellitus, with long-term current use of insulin  (HCC) 09/09/2020  Cryptogenic stroke (HCC) 07/13/2020   History of loop recorder 07/13/2020   Elevated troponin 04/03/2020   Acquired trigger finger 06/08/2019   Lymphedema 06/08/2019   Anemia 04/17/2019   Swelling of both lower extremities 01/10/2019   Facial droop 04/09/2018   Daytime somnolence 03/30/2018   Carotid artery disease (HCC) 02/03/2018   Intracranial vascular stenosis 09/12/2017   Cough 01/20/2017   Bell's palsy 11/10/2016   History of CVA  (cerebrovascular accident) 11/10/2016   Benign localized hyperplasia of prostate with urinary obstruction 10/27/2016   History of nephrolithiasis 10/27/2016   TIA (transient ischemic attack) 10/20/2016   Near syncope 06/23/2016   Organic impotence 10/01/2015   Neuropathy 08/06/2015   Health care maintenance 08/06/2015   Hypertension 08/06/2015   Hypothyroidism 10/10/2013   Microalbuminuria 10/10/2013   Hyperlipidemia 10/10/2013   B12 deficiency 10/10/2013   Environmental allergies 07/04/2013    ONSET DATE: 3 years ago  REFERRING DIAG: unsteady gait  THERAPY DIAG:  Muscle weakness (generalized)  Unsteadiness on feet  Other abnormalities of gait and mobility  Difficulty in walking, not elsewhere classified  Abnormality of gait and mobility  Other lack of coordination  Rationale for Evaluation and Treatment: Rehabilitation  SUBJECTIVE:                                                                                                                                                                                             SUBJECTIVE STATEMENT:   Pt reports it has not been a good morning regarding his balance.  Pt denies a fall, however just feels rough and had a hard time getting his legs underneath him.  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   Pt accompanied by: self  PERTINENT HISTORY: Patient returning to PT s/p hospitalization for pneumonia, , SVT, ESRD on hypodialysis. PMH DM, HTN, Anemia, Bells palsy, Hypothyroidism, Kidney stones, stroke and pseudotumour cerebri. Macular degeneration with impaired visual acuity   PAIN:  Are you having pain? Some L foot numbness/neuropathy  PRECAUTIONS: Fall  RED FLAGS: None   WEIGHT BEARING RESTRICTIONS: No  FALLS: Has patient fallen in last 6 months? No  LIVING ENVIRONMENT: Lives with: lives alone Lives in: House/apartment Stairs: yes stairs in house Has following equipment at  home: Single point cane  PLOF: Independent with basic ADLs  PATIENT GOALS: balance, strength  OBJECTIVE:  Note: Objective measures were completed at Evaluation unless otherwise noted.  DIAGNOSTIC FINDINGS: IMPRESSION: Interval resolution of previously noted trace left parafalcine subdural hematoma. No acute intracranial process.  COGNITION: Overall cognitive status: Within functional limits for tasks assessed  SENSATION: WFL  COORDINATION: Heel slide: WFL RLE; slight decrease with LLE   MUSCLE TONE: WFL  POSTURE: rounded shoulders, forward head, and posterior pelvic tilt  LOWER EXTREMITY ROM:     Active  Right Eval Left Eval  Hip flexion    Hip extension    Hip abduction    Hip adduction    Hip internal rotation    Hip external rotation    Knee flexion    Knee extension    Ankle dorsiflexion  -3  Ankle plantarflexion  35  Ankle inversion  10  Ankle eversion  8   (Blank rows = not tested)  LOWER EXTREMITY MMT:    MMT Right Eval Left Eval  Hip flexion 4 4  Hip extension    Hip abduction 3 3  Hip adduction 3+ 3+  Hip internal rotation    Hip external rotation    Knee flexion 4- 4-  Knee extension 4 4  Ankle dorsiflexion 3 2+  Ankle plantarflexion 3+ 3  Ankle inversion    Ankle eversion    (Blank rows = not tested)  BED MOBILITY:  Not tested  TRANSFERS: Sit to stand: CGA  Assistive device utilized: None     Stand to sit: CGA  Assistive device utilized: None     Chair to chair: CGA  Assistive device utilized: None       RAMP:  Not tested  CURB:  Findings: needs UE support to step up  STAIRS: heavy BUE support step over step negotiation.  GAIT: Findings: Gait Characteristics: decreased stride length, Left foot flat, wide BOS, and poor foot clearance- Left, Distance walked: 60 ft, Assistive device utilized:None, Level of assistance: CGA, and Comments: increased L ankle rolling due to not wearing AFO  FUNCTIONAL TESTS:  5 times sit to  stand: 30.53 with 4 posterior LOB 6 minute walk test: perform next session  Berg Balance Scale: 26  PATIENT SURVEYS:  LEFS give next session                                                                                                                               TREATMENT DATE: 04/05/24   TherAct:   Sit to stand with UE on LE's, due to imbalance and weakness in the LE's, 2x10, pt needing some assistance on the final 3 of the second bout    Neuro:  The [employer] game on the KorebalanceT System is a therapeutic, interactive balance training exercise that integrates visual tracking, motor coordination, and postural control.  The [employer] game on the Many system engages patients in dynamic balance training using an interactive platform that responds to weight shifts and body movements. In this game, patients control a Geneticist, molecular (Tux) racing downhill by leaning or shifting their weight on the [employer]. The goal is to collect fish and avoid obstacles, which requires real-time postural adjustments, coordination, and visual-motor integration. Patient requires min support and performed  intervention for 4 minutes at stability level 6.0 with minimal/moderate/max verbal cueing.     The pt participated in a matchmaking memory exercise that integrates visual tracking, motor coordination, postural control, and recall.  Pt requires min support and intermittent use of the hands for stability while performing. ?   The KoreBalance System uses interactive technology to assess and train balance and neurosensory function. It promotes stability, vestibular cue utilization, coordination, and posture, and can incorporate cognitive training. Patients stand on an adjustable unstable platform. Elements such as activity duration, use of upper extremity support, foot position, and vision (e.g., eyes open or closed) can be modified to adjust difficulty.      TherEx:  Seated leg press,  70#, x10; 80#, x10 Standing calf extension, 80#, 2x10   PATIENT EDUCATION: Education details: goals, POC, HEP Person educated: Patient Education method: Explanation, Demonstration, Tactile cues, Verbal cues, and Handouts Education comprehension: verbalized understanding, returned demonstration, verbal cues required, tactile cues required, and needs further education  HOME EXERCISE PROGRAM: Access Code: [email or web address] URL: [email or web address] Date: 12/29/2023 Prepared by: Marina  Moser  Exercises - Leg Extension  - 1 x daily - 7 x weekly - 2 sets - 10 reps - 5 hold - Seated March  - 1 x daily - 7 x weekly - 2 sets - 10 reps - 5 hold - Seated Heel Raise  - 1 x daily - 7 x weekly - 2 sets - 10 reps - 5 hold  GOALS: Goals reviewed with patient? Yes  SHORT TERM GOALS: Target date: 01/26/2024    Patient will be independent in home exercise program to improve strength/mobility for better functional independence with ADLs.  Baseline: Goal status: INITIAL  LONG TERM GOALS: Target date: 03/22/2024    Patient (< 14 years old) will complete five times sit to stand test in < 10 seconds indicating an increased LE strength and improved balance.  Baseline: 30.53 with 4 posterior LOB 03/29/24: 22.61 with 3 posterior LOB Goal status: PROGRESSING  2.   Patient will increase Berg Balance score by >45/56 to demonstrate decreased fall risk during functional activities.  Baseline: 5/5: 26  03/29/24: 32 Goal status: INITIAL  3.    Patient will increase six minute walk test distance to >1200 for progression to age norm community ambulator and improve gait ability  Baseline: perform next session 5/12: 710 ft  03/29/24: 770' Goal status: PROGRESSING  4.  Patient will score > 60/80 on LEFS for improved functional mobility and quality of life.  Baseline: perform next session 5/12: 43 03/29/24: 39/80 Goal status: INITIAL    ASSESSMENT:  CLINICAL IMPRESSION:   Pt much weaker today and pt  self-acknowledged it upon arrival.  Pt with increased difficulty performing STS's while also having increased stability in standing.  Pt given a few new tasks for this rehab session, including korebalance session and pt performing with intermittent UE use.  Pt performed well and with some lateral instability when shifting weight.   Pt will continue to benefit from skilled therapy to address remaining deficits in order to improve overall QoL and return to PLOF.        OBJECTIVE IMPAIRMENTS: Abnormal gait, cardiopulmonary status limiting activity, decreased activity tolerance, decreased balance, decreased coordination, decreased endurance, decreased knowledge of use of DME, decreased mobility, difficulty walking, decreased ROM, decreased strength, dizziness, impaired perceived functional ability, impaired flexibility, impaired vision/preception, improper body mechanics, postural dysfunction, and obesity.   ACTIVITY LIMITATIONS: carrying, lifting, bending, sitting, standing, squatting, stairs, transfers, bed mobility,  toileting, dressing, reach over head, hygiene/grooming, locomotion level, and caring for others  PARTICIPATION LIMITATIONS: meal prep, cleaning, laundry, driving, shopping, community activity, occupation, and yard work  PERSONAL FACTORS: Age, Behavior pattern, Fitness, Past/current experiences, Time since onset of injury/illness/exacerbation, Transportation, and 3+ comorbidities: DM, HTN, Anemia, Bells palsy, Hypothyroidism, Kidney stones, stroke and pseudotumour cerebri. Macular degeneration with impaired visual acuity  are also affecting patient's functional outcome.   REHAB POTENTIAL: Good  CLINICAL DECISION MAKING: Evolving/moderate complexity  EVALUATION COMPLEXITY: Moderate  PLAN:  PT FREQUENCY: 2x/week  PT DURATION: 12 weeks  PLANNED INTERVENTIONS: 97164- PT Re-evaluation, 97750- Physical Performance Testing, 97110-Therapeutic exercises, 97530- Therapeutic activity, [id number]-  Neuromuscular re-education, 97535- Self Care, 02859- Manual therapy, [id number]- Gait training, [phone]- Orthotic Initial, [phone]- Orthotic/Prosthetic subsequent, [phone]- Canalith repositioning, [id number]- Splinting, H9716- Electrical stimulation (unattended), [phone]- Electrical stimulation (manual), [id number]- Vasopneumatic device, [id number]- Ultrasound, [id number]- Traction (mechanical), [id number]- Ionotophoresis 4mg /ml Dexamethasone , Patient/Family education, Balance training, Stair training, Taping, Dry Needling, Joint mobilization, Spinal mobilization, Scar mobilization, Compression bandaging, Vestibular training, Visual/preceptual remediation/compensation, Cognitive remediation, DME instructions, Cryotherapy, Moist heat, and Biofeedback  PLAN FOR NEXT SESSION:  Korebalance balance, strength, L ankle strength, stairs, LE endurance   Fonda Simpers, PT, DPT Physical Therapist - [facility]  [facility]  04/05/24, 3:18 PM

## 2024-04-06 ENCOUNTER — Other Ambulatory Visit: Payer: Self-pay | Admitting: Internal Medicine

## 2024-04-07 ENCOUNTER — Ambulatory Visit: Admitting: Physical Therapy

## 2024-04-07 DIAGNOSIS — M6281 Muscle weakness (generalized): Secondary | ICD-10-CM

## 2024-04-07 DIAGNOSIS — R262 Difficulty in walking, not elsewhere classified: Secondary | ICD-10-CM

## 2024-04-07 DIAGNOSIS — R269 Unspecified abnormalities of gait and mobility: Secondary | ICD-10-CM

## 2024-04-07 DIAGNOSIS — R2681 Unsteadiness on feet: Secondary | ICD-10-CM

## 2024-04-07 DIAGNOSIS — R2689 Other abnormalities of gait and mobility: Secondary | ICD-10-CM

## 2024-04-07 NOTE — Therapy (Signed)
 OUTPATIENT PHYSICAL THERAPY NEURO TREATMENT   Patient Name: Gregory Crane MRN: [id number] DOB:10/27/1970, 53 y.o., male Today's Date: 04/07/2024   PCP: Glendia Shad  REFERRING PROVIDER: Glendia Shad  END OF SESSION:  PT End of Session - 04/07/24 1138     Visit Number 18    Number of Visits 39    Date for PT Re-Evaluation 06/21/24    Progress Note Due on Visit 20    PT Start Time 1022    PT Stop Time 1100    PT Time Calculation (min) 38 min    Equipment Utilized During Treatment Gait belt    Activity Tolerance Patient tolerated treatment well    Behavior During Therapy WFL for tasks assessed/performed          Past Medical History:  Diagnosis Date   Allergy    Anemia    Bell's palsy    Diabetes mellitus without complication (HCC)    diet controlled   Fall 07/2023   Hypertension    Hypothyroidism    Kidney stones    Pseudotumor cerebri    Stroke [facility])    Past Surgical History:  Procedure Laterality Date   COLONOSCOPY WITH PROPOFOL  N/A 03/30/2020   Procedure: COLONOSCOPY WITH PROPOFOL ;  Surgeon: Maryruth Ole DASEN, MD;  Location: [facility] ENDOSCOPY;  Service: Endoscopy;  Laterality: N/A;   CORONARY STENT INTERVENTION N/A 03/17/2024   Procedure: CORONARY STENT INTERVENTION;  Surgeon: Swaziland, Peter M, MD;  Location: [facility] INVASIVE CV LAB;  Service: Cardiovascular;  Laterality: N/A;   LEFT HEART CATH AND CORONARY ANGIOGRAPHY N/A 03/17/2024   Procedure: LEFT HEART CATH AND CORONARY ANGIOGRAPHY;  Surgeon: Swaziland, Peter M, MD;  Location: [facility] INVASIVE CV LAB;  Service: Cardiovascular;  Laterality: N/A;   LOOP RECORDER INSERTION N/A 01/27/2018   Procedure: LOOP RECORDER INSERTION;  Surgeon: Fernande Elspeth BROCKS, MD;  Location: [facility] INVASIVE CV LAB;  Service: Cardiovascular;  Laterality: N/A;   LUMBAR PUNCTURE     as child   NO PAST SURGERIES     REMOVAL OF A DIALYSIS CATHETER     TEE WITHOUT CARDIOVERSION N/A 01/07/2018   Procedure: TRANSESOPHAGEAL ECHOCARDIOGRAM (TEE);   Surgeon: Perla Evalene PARAS, MD;  Location: [facility] ORS;  Service: Cardiovascular;  Laterality: N/A;   Patient Active Problem List   Diagnosis Date Noted   CAD S/P percutaneous coronary angioplasty 03/17/2024   Non-ST elevation (NSTEMI) myocardial infarction (HCC) 03/16/2024   Hypervolemia 03/14/2024   Hypoxic respiratory failure (HCC) 03/14/2024   Demand ischemia (HCC) 03/14/2024   Left hip pain 01/30/2024   Acute on chronic heart failure with preserved ejection fraction (HFpEF) (HCC) 12/26/2023   Community acquired pneumonia 12/24/2023   Acute respiratory failure with hypoxia (HCC) 12/23/2023   SVT (supraventricular tachycardia) (HCC) 08/10/2023   Atrial fibrillation with rapid ventricular response (HCC) 07/03/2023   Atrial fibrillation with RVR (HCC) 07/02/2023   Fall 06/15/2023   Non-compliance with renal dialysis (HCC) 05/02/2023   Obesity (BMI 30-39.9) 04/30/2023   Volume overload 04/29/2023   Renal osteodystrophy 11/25/2022   Unsteady gait 10/05/2022   Steal syndrome of dialysis vascular access (HCC) 05/28/2022   Hydronephrosis, left 05/04/2022   Leukocytosis 05/04/2022   Chest pain 01/13/2022   Diabetic retinopathy associated with diabetes mellitus due to underlying condition (HCC) 01/13/2022   ESRD on hemodialysis (HCC) 01/13/2022   Deafness in right ear 08/11/2021   Open wound 01/25/2021   History of colon polyps 10/15/2020   Acute cholecystitis without calculus 09/09/2020   Type 2 diabetes mellitus,  with long-term current use of insulin  (HCC) 09/09/2020   Cryptogenic stroke (HCC) 07/13/2020   History of loop recorder 07/13/2020   Elevated troponin 04/03/2020   Acquired trigger finger 06/08/2019   Lymphedema 06/08/2019   Anemia 04/17/2019   Swelling of both lower extremities 01/10/2019   Facial droop 04/09/2018   Daytime somnolence 03/30/2018   Carotid artery disease (HCC) 02/03/2018   Intracranial vascular stenosis 09/12/2017   Cough 01/20/2017   Bell's palsy  11/10/2016   History of CVA (cerebrovascular accident) 11/10/2016   Benign localized hyperplasia of prostate with urinary obstruction 10/27/2016   History of nephrolithiasis 10/27/2016   TIA (transient ischemic attack) 10/20/2016   Near syncope 06/23/2016   Organic impotence 10/01/2015   Neuropathy 08/06/2015   Health care maintenance 08/06/2015   Hypertension 08/06/2015   Hypothyroidism 10/10/2013   Microalbuminuria 10/10/2013   Hyperlipidemia 10/10/2013   B12 deficiency 10/10/2013   Environmental allergies 07/04/2013    ONSET DATE: 3 years ago  REFERRING DIAG: unsteady gait  THERAPY DIAG:  Muscle weakness (generalized)  Unsteadiness on feet  Other abnormalities of gait and mobility  Difficulty in walking, not elsewhere classified  Abnormality of gait and mobility  Rationale for Evaluation and Treatment: Rehabilitation  SUBJECTIVE:                                                                                                                                                                                             SUBJECTIVE STATEMENT:   Pt reports it has not been a good morning regarding his balance.  Pt denies a fall, however just feels rough and had a hard time getting his legs underneath him.  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   Pt accompanied by: self  PERTINENT HISTORY: Patient returning to PT s/p hospitalization for pneumonia, , SVT, ESRD on hypodialysis. PMH DM, HTN, Anemia, Bells palsy, Hypothyroidism, Kidney stones, stroke and pseudotumour cerebri. Macular degeneration with impaired visual acuity   PAIN:  Are you having pain? Some L foot numbness/neuropathy  PRECAUTIONS: Fall  RED FLAGS: None   WEIGHT BEARING RESTRICTIONS: No  FALLS: Has patient fallen in last 6 months? No  LIVING ENVIRONMENT: Lives with: lives alone Lives in: House/apartment Stairs: yes stairs in house Has following equipment at  home: Single point cane  PLOF: Independent with basic ADLs  PATIENT GOALS: balance, strength  OBJECTIVE:  Note: Objective measures were completed at Evaluation unless otherwise noted.  DIAGNOSTIC FINDINGS: IMPRESSION: Interval resolution of previously noted trace left parafalcine subdural hematoma. No acute intracranial process.  COGNITION: Overall cognitive status: Within functional limits  for tasks assessed   SENSATION: WFL  COORDINATION: Heel slide: WFL RLE; slight decrease with LLE   MUSCLE TONE: WFL  POSTURE: rounded shoulders, forward head, and posterior pelvic tilt  LOWER EXTREMITY ROM:     Active  Right Eval Left Eval  Hip flexion    Hip extension    Hip abduction    Hip adduction    Hip internal rotation    Hip external rotation    Knee flexion    Knee extension    Ankle dorsiflexion  -3  Ankle plantarflexion  35  Ankle inversion  10  Ankle eversion  8   (Blank rows = not tested)  LOWER EXTREMITY MMT:    MMT Right Eval Left Eval  Hip flexion 4 4  Hip extension    Hip abduction 3 3  Hip adduction 3+ 3+  Hip internal rotation    Hip external rotation    Knee flexion 4- 4-  Knee extension 4 4  Ankle dorsiflexion 3 2+  Ankle plantarflexion 3+ 3  Ankle inversion    Ankle eversion    (Blank rows = not tested)  BED MOBILITY:  Not tested  TRANSFERS: Sit to stand: CGA  Assistive device utilized: None     Stand to sit: CGA  Assistive device utilized: None     Chair to chair: CGA  Assistive device utilized: None       RAMP:  Not tested  CURB:  Findings: needs UE support to step up  STAIRS: heavy BUE support step over step negotiation.  GAIT: Findings: Gait Characteristics: decreased stride length, Left foot flat, wide BOS, and poor foot clearance- Left, Distance walked: 60 ft, Assistive device utilized:None, Level of assistance: CGA, and Comments: increased L ankle rolling due to not wearing AFO  FUNCTIONAL TESTS:  5 times sit to  stand: 30.53 with 4 posterior LOB 6 minute walk test: perform next session  Berg Balance Scale: 26  PATIENT SURVEYS:  LEFS give next session                                                                                                                               TREATMENT DATE: 04/07/24   TherAct:   Sit to stand with UE on LE's, due to imbalance and weakness in the LE's, 2x10  Step up with UE support 2 x 10 ea LE, seated rest between sets due to fatigue   Neuro: ?   The KoreBalance System uses interactive technology to assess and train balance and neurosensory function. It promotes stability, vestibular cue utilization, coordination, and posture, and can incorporate cognitive training. Patients stand on an adjustable unstable platform. Elements such as activity duration, use of upper extremity support, foot position, and vision (e.g., eyes open or closed) can be modified to adjust difficulty.   The patient participated in the PopDot game on the [facility] platform for at stability level 5. This interactive game requires the  patient to quickly shift their weight and balance to "pop" virtual dots appearing in various locations on the screen. The activity challenges dynamic balance, reaction time, coordination, and weight shifting in a fun, engaging environment.   Patient also uses dynamic stability setting physical therapist pointing to spaces on the screen the patient has difficulty with during Pap for another 5 minutes.    TherEx:  Seated long arc quad with 4 pound ankle weights x 10 reps x 2 sets each lower extremity  Seated march 2 sets of 10 reps each lower extremity with 4 pound ankle weights  Seated heel raises with 4 pound ankle weights 2 sets of 20 repetitions  Pt required occasional rest breaks due fatigue, PT was attentive to when pt appeared to be tired or winded in order to prevent excessive fatigue.   PATIENT EDUCATION: Education details: goals, POC,  HEP Person educated: Patient Education method: Explanation, Demonstration, Tactile cues, Verbal cues, and Handouts Education comprehension: verbalized understanding, returned demonstration, verbal cues required, tactile cues required, and needs further education  HOME EXERCISE PROGRAM: Access Code: [email or web address] URL: [email or web address] Date: 12/29/2023 Prepared by: Marina  Moser  Exercises - Leg Extension  - 1 x daily - 7 x weekly - 2 sets - 10 reps - 5 hold - Seated March  - 1 x daily - 7 x weekly - 2 sets - 10 reps - 5 hold - Seated Heel Raise  - 1 x daily - 7 x weekly - 2 sets - 10 reps - 5 hold  GOALS: Goals reviewed with patient? Yes  SHORT TERM GOALS: Target date: 01/26/2024    Patient will be independent in home exercise program to improve strength/mobility for better functional independence with ADLs.  Baseline: Goal status: INITIAL  LONG TERM GOALS: Target date: 03/22/2024    Patient (< 65 years old) will complete five times sit to stand test in < 10 seconds indicating an increased LE strength and improved balance.  Baseline: 30.53 with 4 posterior LOB 03/29/24: 22.61 with 3 posterior LOB Goal status: PROGRESSING  2.   Patient will increase Berg Balance score by >45/56 to demonstrate decreased fall risk during functional activities.  Baseline: 5/5: 26  03/29/24: 32 Goal status: INITIAL  3.    Patient will increase six minute walk test distance to >1200 for progression to age norm community ambulator and improve gait ability  Baseline: perform next session 5/12: 710 ft  03/29/24: 770' Goal status: PROGRESSING  4.  Patient will score > 60/80 on LEFS for improved functional mobility and quality of life.  Baseline: perform next session 5/12: 43 03/29/24: 39/80 Goal status: INITIAL    ASSESSMENT:  CLINICAL IMPRESSION:   Patient continues to report weakness following dialysis the day before.  Patient is prepared to perform physical therapy activities though  and is motivated to complete activities despite some fatigue.  Patient continues with dynamic balance and functional strengthening interventions this date. Pt will continue to benefit from skilled physical therapy intervention to address impairments, improve QOL, and attain therapy goals.    OBJECTIVE IMPAIRMENTS: Abnormal gait, cardiopulmonary status limiting activity, decreased activity tolerance, decreased balance, decreased coordination, decreased endurance, decreased knowledge of use of DME, decreased mobility, difficulty walking, decreased ROM, decreased strength, dizziness, impaired perceived functional ability, impaired flexibility, impaired vision/preception, improper body mechanics, postural dysfunction, and obesity.   ACTIVITY LIMITATIONS: carrying, lifting, bending, sitting, standing, squatting, stairs, transfers, bed mobility, toileting, dressing, reach over head, hygiene/grooming, locomotion level, and caring for others  PARTICIPATION LIMITATIONS:  meal prep, cleaning, laundry, driving, shopping, community activity, occupation, and yard work  PERSONAL FACTORS: Age, Behavior pattern, Fitness, Past/current experiences, Time since onset of injury/illness/exacerbation, Transportation, and 3+ comorbidities: DM, HTN, Anemia, Bells palsy, Hypothyroidism, Kidney stones, stroke and pseudotumour cerebri. Macular degeneration with impaired visual acuity  are also affecting patient's functional outcome.   REHAB POTENTIAL: Good  CLINICAL DECISION MAKING: Evolving/moderate complexity  EVALUATION COMPLEXITY: Moderate  PLAN:  PT FREQUENCY: 2x/week  PT DURATION: 12 weeks  PLANNED INTERVENTIONS: 97164- PT Re-evaluation, 97750- Physical Performance Testing, 97110-Therapeutic exercises, 97530- Therapeutic activity, [id number]- Neuromuscular re-education, 97535- Self Care, [id number]- Manual therapy, [id number]- Gait training, [id number]- Orthotic Initial, [id number]- Orthotic/Prosthetic subsequent, [phone]- Canalith  repositioning, [id number]- Splinting, H9716- Electrical stimulation (unattended), [phone]- Electrical stimulation (manual), [id number]- Vasopneumatic device, [id number]- Ultrasound, [id number]- Traction (mechanical), [id number]- Ionotophoresis 4mg /ml Dexamethasone , Patient/Family education, Balance training, Stair training, Taping, Dry Needling, Joint mobilization, Spinal mobilization, Scar mobilization, Compression bandaging, Vestibular training, Visual/preceptual remediation/compensation, Cognitive remediation, DME instructions, Cryotherapy, Moist heat, and Biofeedback  PLAN FOR NEXT SESSION:  Korebalance balance, strength, L ankle strength, stairs, LE endurance   Note: Portions of this document were prepared using Dragon voice recognition software and although reviewed may contain unintentional dictation errors in syntax, grammar, or spelling.  Lonni KATHEE Gainer PT ,DPT Physical Therapist- [facility]   04/07/24, 11:39 AM

## 2024-04-08 ENCOUNTER — Other Ambulatory Visit: Payer: Self-pay | Admitting: Family

## 2024-04-08 MED ORDER — PREGABALIN 100 MG PO CAPS: 100.0000 mg | ORAL_CAPSULE | Freq: Every day | ORAL | 0 refills | Status: DC

## 2024-04-08 NOTE — Telephone Encounter (Signed)
 Need to clarify what dose of lyrica  he is taking and correct rx.

## 2024-04-08 NOTE — Telephone Encounter (Signed)
 Confirmed with patient that he is taking 100 mg q day. His original rx was written for 50 mg capsules with the directions of 1-2 capsules at bedtime. Pt was taking 2 every night. Would like to have the 100 mg capsules sent in so he only has to take one. He is out of medication. Sending to doc of day for approval

## 2024-04-08 NOTE — Telephone Encounter (Signed)
 Left detailed message for patient.

## 2024-04-12 ENCOUNTER — Ambulatory Visit

## 2024-04-12 DIAGNOSIS — R2689 Other abnormalities of gait and mobility: Secondary | ICD-10-CM

## 2024-04-12 DIAGNOSIS — R262 Difficulty in walking, not elsewhere classified: Secondary | ICD-10-CM

## 2024-04-12 DIAGNOSIS — M6281 Muscle weakness (generalized): Secondary | ICD-10-CM | POA: Diagnosis not present

## 2024-04-12 DIAGNOSIS — R278 Other lack of coordination: Secondary | ICD-10-CM

## 2024-04-12 DIAGNOSIS — R269 Unspecified abnormalities of gait and mobility: Secondary | ICD-10-CM

## 2024-04-12 DIAGNOSIS — R2681 Unsteadiness on feet: Secondary | ICD-10-CM

## 2024-04-12 NOTE — Therapy (Signed)
 OUTPATIENT PHYSICAL THERAPY NEURO TREATMENT   Patient Name: Gregory Crane MRN: [id number] DOB:09-13-1970, 53 y.o., male Today's Date: 04/12/2024   PCP: Glendia Shad  REFERRING PROVIDER: Glendia Shad  END OF SESSION:  PT End of Session - 04/12/24 1022     Visit Number 19    Number of Visits 39    Date for PT Re-Evaluation 06/21/24    Progress Note Due on Visit 20    PT Start Time 1018    PT Stop Time 1100    PT Time Calculation (min) 42 min    Equipment Utilized During Treatment Gait belt    Activity Tolerance Patient tolerated treatment well    Behavior During Therapy WFL for tasks assessed/performed          Past Medical History:  Diagnosis Date   Allergy    Anemia    Bell's palsy    Diabetes mellitus without complication (HCC)    diet controlled   Fall 07/2023   Hypertension    Hypothyroidism    Kidney stones    Pseudotumor cerebri    Stroke [facility])    Past Surgical History:  Procedure Laterality Date   COLONOSCOPY WITH PROPOFOL  N/A 03/30/2020   Procedure: COLONOSCOPY WITH PROPOFOL ;  Surgeon: Maryruth Ole DASEN, MD;  Location: [facility] ENDOSCOPY;  Service: Endoscopy;  Laterality: N/A;   CORONARY STENT INTERVENTION N/A 03/17/2024   Procedure: CORONARY STENT INTERVENTION;  Surgeon: [place]azil[place], Peter M, MD;  Location: [facility] INVASIVE CV LAB;  Service: Cardiovascular;  Laterality: N/A;   LEFT HEART CATH AND CORONARY ANGIOGRAPHY N/A 03/17/2024   Procedure: LEFT HEART CATH AND CORONARY ANGIOGRAPHY;  Surgeon: [place]azil[place], Peter M, MD;  Location: [facility] INVASIVE CV LAB;  Service: Cardiovascular;  Laterality: N/A;   LOOP RECORDER INSERTION N/A 01/27/2018   Procedure: LOOP RECORDER INSERTION;  Surgeon: Fernande Elspeth BROCKS, MD;  Location: [facility] INVASIVE CV LAB;  Service: Cardiovascular;  Laterality: N/A;   LUMBAR PUNCTURE     as child   NO PAST SURGERIES     REMOVAL OF A DIALYSIS CATHETER     TEE WITHOUT CARDIOVERSION N/A 01/07/2018   Procedure: TRANSESOPHAGEAL ECHOCARDIOGRAM (TEE);   Surgeon: Perla Evalene PARAS, MD;  Location: [facility] ORS;  Service: Cardiovascular;  Laterality: N/A;   Patient Active Problem List   Diagnosis Date Noted   CAD S/P percutaneous coronary angioplasty 03/17/2024   Non-ST elevation (NSTEMI) myocardial infarction (HCC) 03/16/2024   Hypervolemia 03/14/2024   Hypoxic respiratory failure (HCC) 03/14/2024   Demand ischemia (HCC) 03/14/2024   Left hip pain 01/30/2024   Acute on chronic heart failure with preserved ejection fraction (HFpEF) (HCC) 12/26/2023   Community acquired pneumonia 12/24/2023   Acute respiratory failure with hypoxia (HCC) 12/23/2023   SVT (supraventricular tachycardia) (HCC) 08/10/2023   Atrial fibrillation with rapid ventricular response (HCC) 07/03/2023   Atrial fibrillation with RVR (HCC) 07/02/2023   Fall 06/15/2023   Non-compliance with renal dialysis (HCC) 05/02/2023   Obesity (BMI 30-39.9) 04/30/2023   Volume overload 04/29/2023   Renal osteodystrophy 11/25/2022   Unsteady gait 10/05/2022   Steal syndrome of dialysis vascular access (HCC) 05/28/2022   Hydronephrosis, left 05/04/2022   Leukocytosis 05/04/2022   Chest pain 01/13/2022   Diabetic retinopathy associated with diabetes mellitus due to underlying condition (HCC) 01/13/2022   ESRD on hemodialysis (HCC) 01/13/2022   Deafness in right ear 08/11/2021   Open wound 01/25/2021   History of colon polyps 10/15/2020   Acute cholecystitis without calculus 09/09/2020   Type 2 diabetes mellitus,  with long-term current use of insulin  (HCC) 09/09/2020   Cryptogenic stroke (HCC) 07/13/2020   History of loop recorder 07/13/2020   Elevated troponin 04/03/2020   Acquired trigger finger 06/08/2019   Lymphedema 06/08/2019   Anemia 04/17/2019   Swelling of both lower extremities 01/10/2019   Facial droop 04/09/2018   Daytime somnolence 03/30/2018   Carotid artery disease (HCC) 02/03/2018   Intracranial vascular stenosis 09/12/2017   Cough 01/20/2017   Bell's palsy  11/10/2016   History of CVA (cerebrovascular accident) 11/10/2016   Benign localized hyperplasia of prostate with urinary obstruction 10/27/2016   History of nephrolithiasis 10/27/2016   TIA (transient ischemic attack) 10/20/2016   Near syncope 06/23/2016   Organic impotence 10/01/2015   Neuropathy 08/06/2015   Health care maintenance 08/06/2015   Hypertension 08/06/2015   Hypothyroidism 10/10/2013   Microalbuminuria 10/10/2013   Hyperlipidemia 10/10/2013   B12 deficiency 10/10/2013   Environmental allergies 07/04/2013    ONSET DATE: 3 years ago  REFERRING DIAG: unsteady gait  THERAPY DIAG:  Muscle weakness (generalized)  Unsteadiness on feet  Other abnormalities of gait and mobility  Difficulty in walking, not elsewhere classified  Abnormality of gait and mobility  Other lack of coordination  Rationale for Evaluation and Treatment: Rehabilitation  SUBJECTIVE:                                                                                                                                                                                             SUBJECTIVE STATEMENT:   Pt arrives to the clinic without AD.  Pt reports he did not do a lot over the weekend.    Pt accompanied by: self  PERTINENT HISTORY: Patient returning to PT s/p hospitalization for pneumonia, , SVT, ESRD on hypodialysis. PMH DM, HTN, Anemia, Bells palsy, Hypothyroidism, Kidney stones, stroke and pseudotumour cerebri. Macular degeneration with impaired visual acuity   PAIN:  Are you having pain? Some L foot numbness/neuropathy  PRECAUTIONS: Fall  RED FLAGS: None   WEIGHT BEARING RESTRICTIONS: No  FALLS: Has patient fallen in last 6 months? No  LIVING ENVIRONMENT: Lives with: lives alone Lives in: House/apartment Stairs: yes stairs in house Has following equipment at home: Single point cane  PLOF: Independent with basic ADLs  PATIENT GOALS: balance, strength  OBJECTIVE:  Note:  Objective measures were completed at Evaluation unless otherwise noted.  DIAGNOSTIC FINDINGS: IMPRESSION: Interval resolution of previously noted trace left parafalcine subdural hematoma. No acute intracranial process.  COGNITION: Overall cognitive status: Within functional limits for tasks assessed   SENSATION: WFL  COORDINATION: Heel slide: WFL RLE; slight decrease with LLE   MUSCLE TONE:  WFL  POSTURE: rounded shoulders, forward head, and posterior pelvic tilt  LOWER EXTREMITY ROM:     Active  Right Eval Left Eval  Hip flexion    Hip extension    Hip abduction    Hip adduction    Hip internal rotation    Hip external rotation    Knee flexion    Knee extension    Ankle dorsiflexion  -3  Ankle plantarflexion  35  Ankle inversion  10  Ankle eversion  8   (Blank rows = not tested)  LOWER EXTREMITY MMT:    MMT Right Eval Left Eval  Hip flexion 4 4  Hip extension    Hip abduction 3 3  Hip adduction 3+ 3+  Hip internal rotation    Hip external rotation    Knee flexion 4- 4-  Knee extension 4 4  Ankle dorsiflexion 3 2+  Ankle plantarflexion 3+ 3  Ankle inversion    Ankle eversion    (Blank rows = not tested)  BED MOBILITY:  Not tested  TRANSFERS: Sit to stand: CGA  Assistive device utilized: None     Stand to sit: CGA  Assistive device utilized: None     Chair to chair: CGA  Assistive device utilized: None       RAMP:  Not tested  CURB:  Findings: needs UE support to step up  STAIRS: heavy BUE support step over step negotiation.  GAIT: Findings: Gait Characteristics: decreased stride length, Left foot flat, wide BOS, and poor foot clearance- Left, Distance walked: 60 ft, Assistive device utilized:None, Level of assistance: CGA, and Comments: increased L ankle rolling due to not wearing AFO  FUNCTIONAL TESTS:  5 times sit to stand: 30.53 with 4 posterior LOB 6 minute walk test: perform next session  Berg Balance Scale: 26  PATIENT SURVEYS:   LEFS give next session                                                                                                                               TREATMENT DATE: 04/12/24   TherAct:   Sit to stand with UE on LE's, due to imbalance and weakness in the LE's, 2x10  Pt with instability during 5/10 first attempts   Neuro: ?   Lateral weight shifts on wobble board, x10  Forward weight shifts on wobble board, x5 pain noted in the R knee and unable to continue  Patient also uses dynamic stability setting physical therapist pointing to spaces on the screen the patient has difficulty with during Pap for another 5 minutes.   TherEx:  Seated LAQ with 4# AW donned, 2x10, cues for full extension and keeping toes pointed up  Seated march 2 sets of 10 reps each lower extremity with 4 pound ankle weights  Seated hip adduction squeezes, 3 sec holds, 2x10  Seated hip adduction squeeze with calf raises, 4# AW donned, 2x10  Seated heel raises with forefoot on  half foam roller for increased range of activity, 4# AW donned, 2x10  Seated Seated toes raises with forefoot on half foam roller for increased range of activity, 4# AW donned, 2x10  Pt required occasional rest breaks due fatigue, PT was attentive to when pt appeared to be tired or winded in order to prevent excessive fatigue.   PATIENT EDUCATION: Education details: goals, POC, HEP Person educated: Patient Education method: Explanation, Demonstration, Tactile cues, Verbal cues, and Handouts Education comprehension: verbalized understanding, returned demonstration, verbal cues required, tactile cues required, and needs further education  HOME EXERCISE PROGRAM: Access Code: [email or web address] URL: [email or web address] Date: 12/29/2023 Prepared by: Marina  Moser  Exercises - Leg Extension  - 1 x daily - 7 x weekly - 2 sets - 10 reps - 5 hold - Seated March  - 1 x daily - 7 x weekly - 2 sets - 10 reps - 5 hold - Seated Heel  Raise  - 1 x daily - 7 x weekly - 2 sets - 10 reps - 5 hold  GOALS: Goals reviewed with patient? Yes  SHORT TERM GOALS: Target date: 01/26/2024    Patient will be independent in home exercise program to improve strength/mobility for better functional independence with ADLs.  Baseline: Goal status: INITIAL  LONG TERM GOALS: Target date: 03/22/2024    Patient (< 36 years old) will complete five times sit to stand test in < 10 seconds indicating an increased LE strength and improved balance.  Baseline: 30.53 with 4 posterior LOB 03/29/24: 22.61 with 3 posterior LOB Goal status: PROGRESSING  2.   Patient will increase Berg Balance score by >45/56 to demonstrate decreased fall risk during functional activities.  Baseline: 5/5: 26  03/29/24: 32 Goal status: INITIAL  3.    Patient will increase six minute walk test distance to >1200 for progression to age norm community ambulator and improve gait ability  Baseline: perform next session 5/12: 710 ft  03/29/24: 770' Goal status: PROGRESSING  4.  Patient will score > 60/80 on LEFS for improved functional mobility and quality of life.  Baseline: perform next session 5/12: 43 03/29/24: 39/80 Goal status: INITIAL    ASSESSMENT:  CLINICAL IMPRESSION:   Pt somewhat limited due to the pain in the R inner thigh/knee area when performing balance and upright activities.  Pt ultimately had to perform seated exercises due to the pain he was experiencing.  Pt with increased stability during sit to stands when given the verbal cue to come upright slowly.  Pt noted it to be more difficult strength wise, but was much more in control of his body.   Pt will continue to benefit from skilled therapy to address remaining deficits in order to improve overall QoL and return to PLOF.      OBJECTIVE IMPAIRMENTS: Abnormal gait, cardiopulmonary status limiting activity, decreased activity tolerance, decreased balance, decreased coordination, decreased endurance,  decreased knowledge of use of DME, decreased mobility, difficulty walking, decreased ROM, decreased strength, dizziness, impaired perceived functional ability, impaired flexibility, impaired vision/preception, improper body mechanics, postural dysfunction, and obesity.   ACTIVITY LIMITATIONS: carrying, lifting, bending, sitting, standing, squatting, stairs, transfers, bed mobility, toileting, dressing, reach over head, hygiene/grooming, locomotion level, and caring for others  PARTICIPATION LIMITATIONS: meal prep, cleaning, laundry, driving, shopping, community activity, occupation, and yard work  PERSONAL FACTORS: Age, Behavior pattern, Fitness, Past/current experiences, Time since onset of injury/illness/exacerbation, Transportation, and 3+ comorbidities: DM, HTN, Anemia, Bells palsy, Hypothyroidism, Kidney stones, stroke and pseudotumour cerebri. Macular degeneration with impaired  visual acuity  are also affecting patient's functional outcome.   REHAB POTENTIAL: Good  CLINICAL DECISION MAKING: Evolving/moderate complexity  EVALUATION COMPLEXITY: Moderate  PLAN:  PT FREQUENCY: 2x/week  PT DURATION: 12 weeks  PLANNED INTERVENTIONS: 97164- PT Re-evaluation, 97750- Physical Performance Testing, 97110-Therapeutic exercises, 97530- Therapeutic activity, [id number]- Neuromuscular re-education, 97535- Self Care, [id number]- Manual therapy, [id number]- Gait training, [phone]- Orthotic Initial, [phone]- Orthotic/Prosthetic subsequent, [phone]- Canalith repositioning, [id number]- Splinting, H9716- Electrical stimulation (unattended), [phone]- Electrical stimulation (manual), [id number]- Vasopneumatic device, [id number]- Ultrasound, [id number]- Traction (mechanical), [id number]- Ionotophoresis 4mg /ml Dexamethasone , Patient/Family education, Balance training, Stair training, Taping, Dry Needling, Joint mobilization, Spinal mobilization, Scar mobilization, Compression bandaging, Vestibular training, Visual/preceptual remediation/compensation, Cognitive  remediation, DME instructions, Cryotherapy, Moist heat, and Biofeedback  PLAN FOR NEXT SESSION:   Korebalance balance, strength, L ankle strength, stairs, LE endurance   Fonda Simpers, PT, DPT Physical Therapist - [place]  [facility]  04/12/24, 11:02 AM

## 2024-04-14 ENCOUNTER — Ambulatory Visit

## 2024-04-14 IMAGING — CT CT PELVIS W/O CM
2 of 3 series · 17 of 46 positions shown, 19 images · non-contrast
Comparison: None.

CLINICAL DATA: Perianal abscess.

EXAM:
CT PELVIS WITHOUT CONTRAST
TECHNIQUE: Multidetector CT imaging of the pelvis was performed following the
standard protocol without intravenous contrast.
RADIATION DOSE REDUCTION: This exam was performed according to the
departmental dose-optimization program which includes automated
exposure control, adjustment of the mA and/or kV according to
patient size and/or use of iterative reconstruction technique.

[Series 2: routine abd/pel wo · axial · 0.92mm/px · z∈[-418,-68]mm · 14 of 82 slices shown, 16 images]
[im 6/82  soft-tissue]
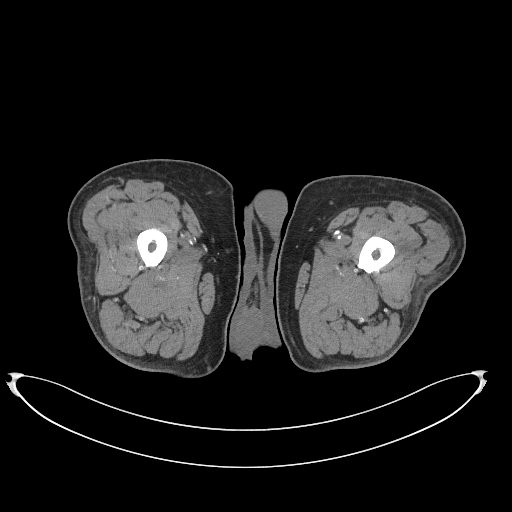
[im 6/82  bone]
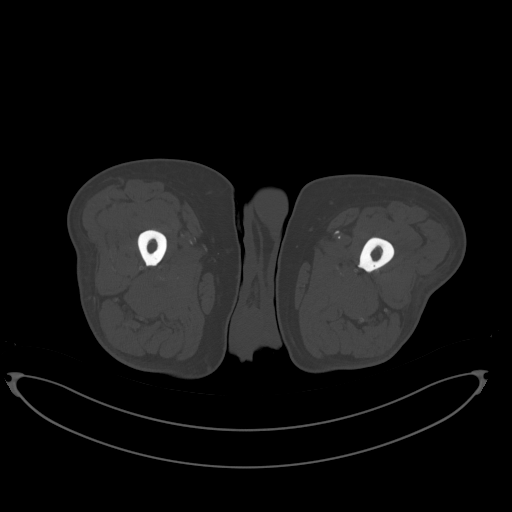
[im 11/82  soft-tissue]
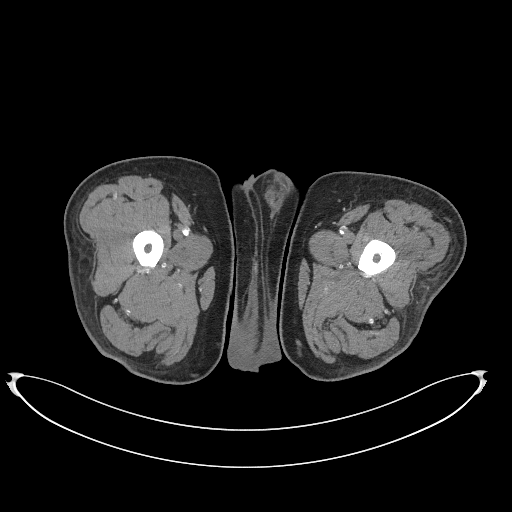
[im 16/82  soft-tissue]
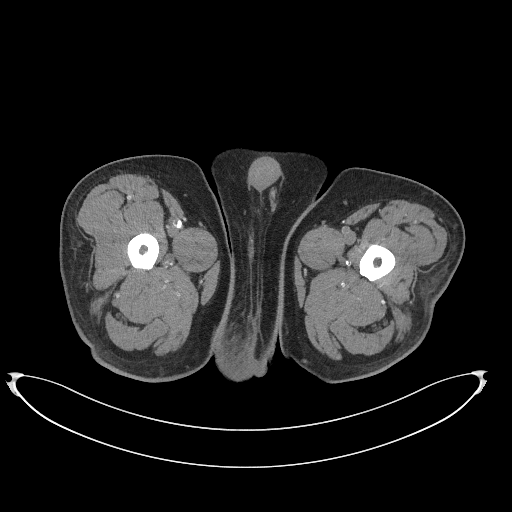
[im 21/82  soft-tissue]
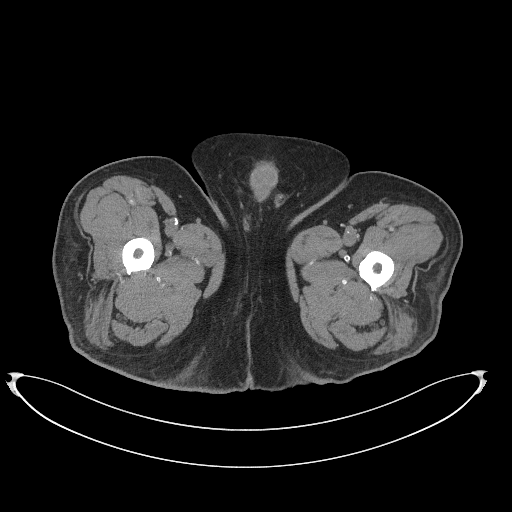
[im 27/82  soft-tissue]
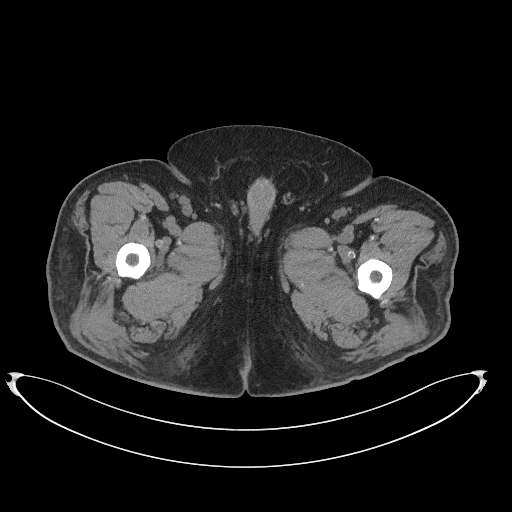
[im 32/82  soft-tissue]
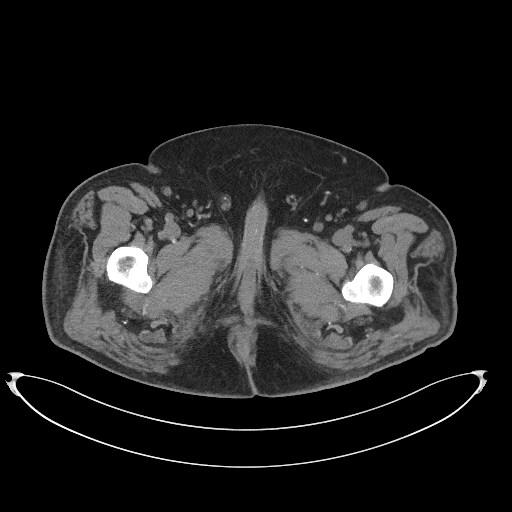
[im 37/82  soft-tissue]
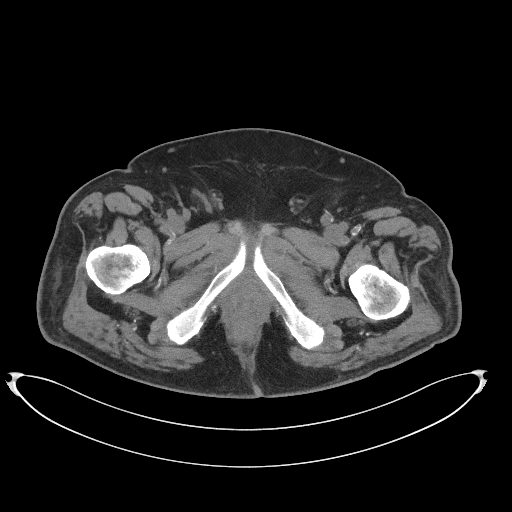
[im 45/82  soft-tissue]
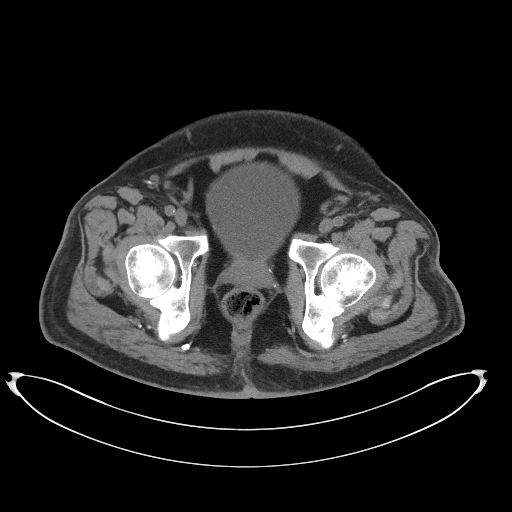
[im 50/82  soft-tissue]
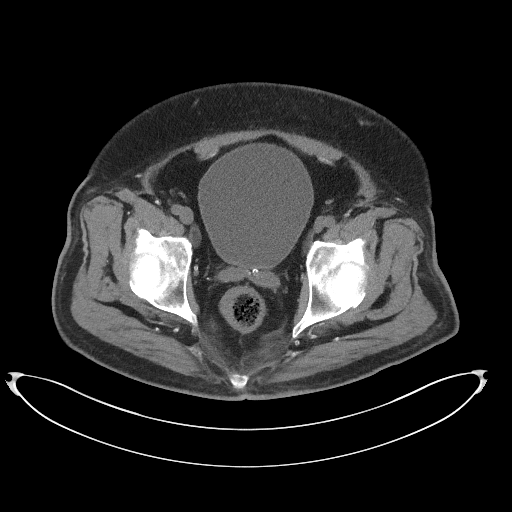
[im 50/82  bone]
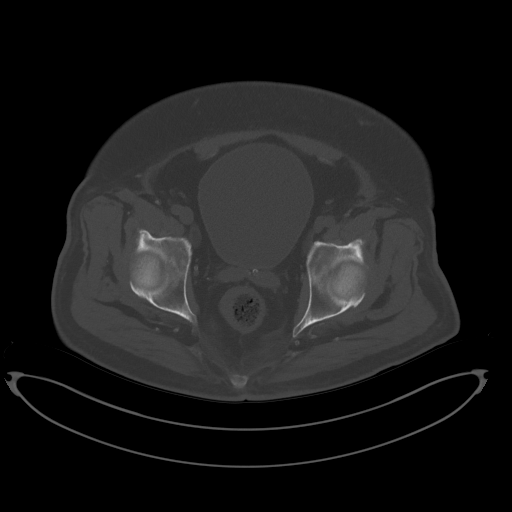
[im 55/82  soft-tissue]
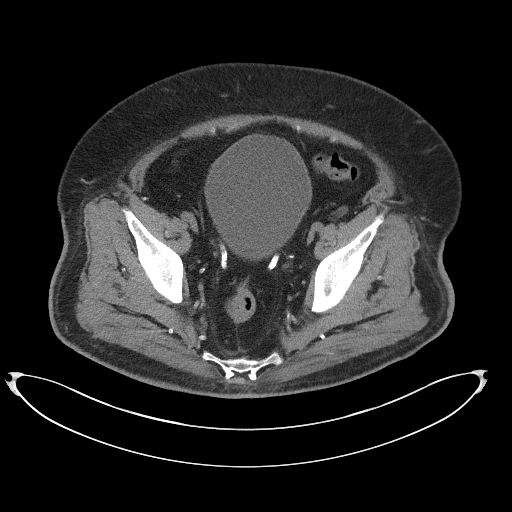
[im 61/82  soft-tissue]
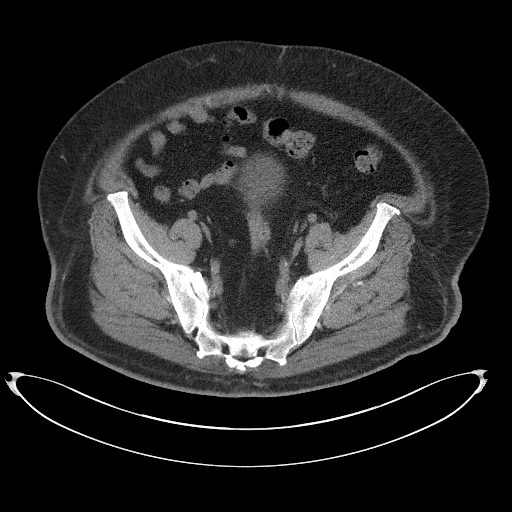
[im 66/82  soft-tissue]
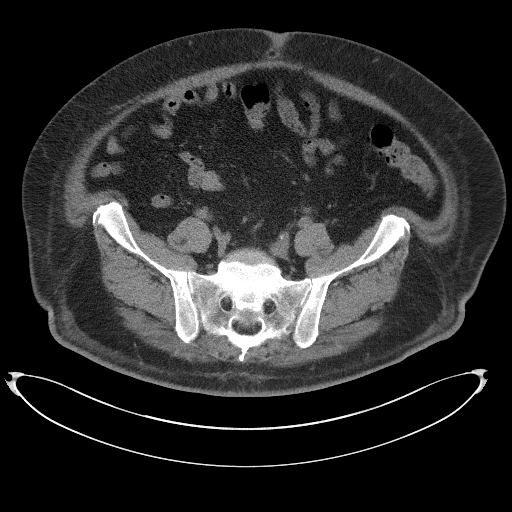
[im 71/82  soft-tissue]
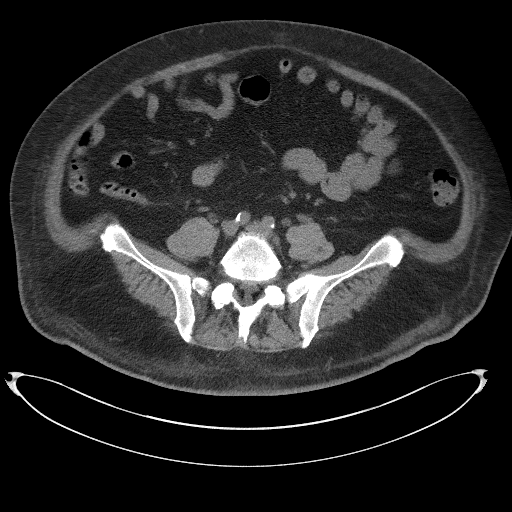
[im 76/82  soft-tissue]
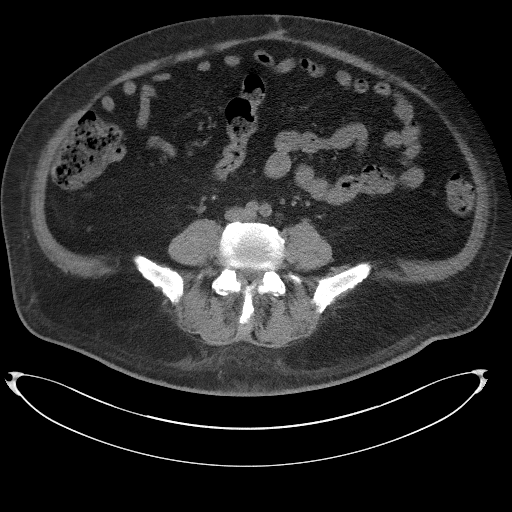

[Series 4: coronal st · coronal · 0.79mm/px · 3 of 119 slices shown]
[im 40/119  soft-tissue]
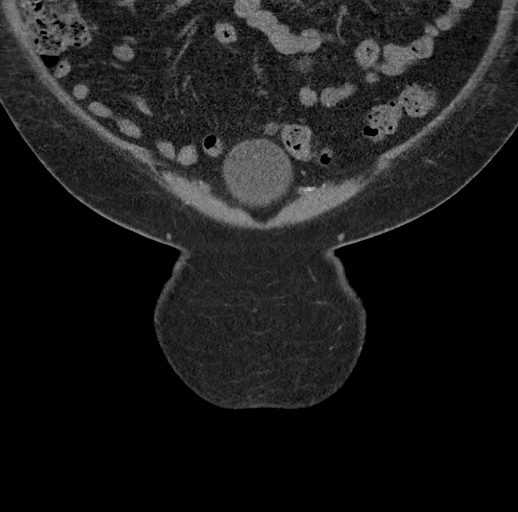
[im 53/119  soft-tissue]
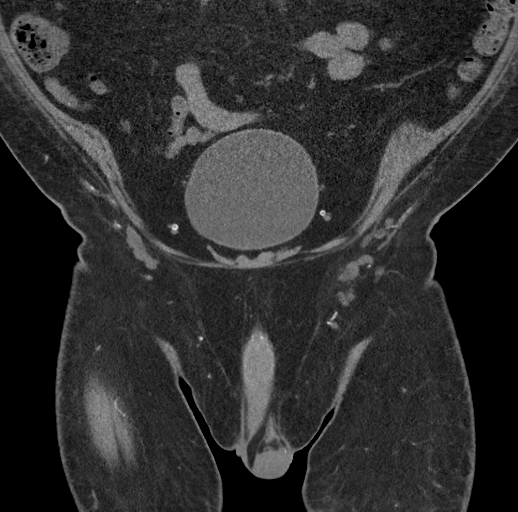
[im 66/119  soft-tissue]
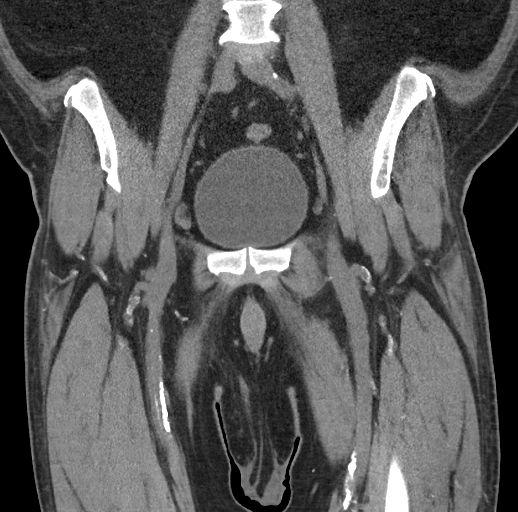

[17 of 46 positions shown; findings below may reference images not displayed]

FINDINGS: Urinary Tract:  No abnormality visualized.

Bowel:  Unremarkable visualized pelvic bowel loops.

Vascular/Lymphatic: Mild aortoiliac atherosclerotic disease. No
pelvic adenopathy.

Reproductive: The prostate and seminal vesicles are grossly
unremarkable.

Other: Mild thickening of the scrotal wall. No fluid collection or
abscess.

Musculoskeletal: Mild degenerative changes. No acute osseous
pathology.
IMPRESSION: No fluid collection or abscess.

Aortic Atherosclerosis ([id number]).

## 2024-04-19 ENCOUNTER — Ambulatory Visit

## 2024-04-19 NOTE — Therapy (Incomplete)
 OUTPATIENT PHYSICAL THERAPY NEURO TREATMENT   Patient Name: Gregory Crane MRN: [id number] DOB:Feb 28, 1971, 53 y.o., male Today's Date: 04/19/2024   PCP: Glendia Shad  REFERRING PROVIDER: Glendia Shad  END OF SESSION:    Past Medical History:  Diagnosis Date   Allergy    Anemia    Bell's palsy    Diabetes mellitus without complication (HCC)    diet controlled   Fall 07/2023   Hypertension    Hypothyroidism    Kidney stones    Pseudotumor cerebri    Stroke [facility])    Past Surgical History:  Procedure Laterality Date   COLONOSCOPY WITH PROPOFOL  N/A 03/30/2020   Procedure: COLONOSCOPY WITH PROPOFOL ;  Surgeon: Maryruth Ole DASEN, MD;  Location: [facility] ENDOSCOPY;  Service: Endoscopy;  Laterality: N/A;   CORONARY STENT INTERVENTION N/A 03/17/2024   Procedure: CORONARY STENT INTERVENTION;  Surgeon: Swazil[place], Peter M, MD;  Location: [facility] INVASIVE CV LAB;  Service: Cardiovascular;  Laterality: N/A;   LEFT HEART CATH AND CORONARY ANGIOGRAPHY N/A 03/17/2024   Procedure: LEFT HEART CATH AND CORONARY ANGIOGRAPHY;  Surgeon: Swaziland, Peter M, MD;  Location: [facility] INVASIVE CV LAB;  Service: Cardiovascular;  Laterality: N/A;   LOOP RECORDER INSERTION N/A 01/27/2018   Procedure: LOOP RECORDER INSERTION;  Surgeon: Fernande Elspeth BROCKS, MD;  Location: [facility] INVASIVE CV LAB;  Service: Cardiovascular;  Laterality: N/A;   LUMBAR PUNCTURE     as child   NO PAST SURGERIES     REMOVAL OF A DIALYSIS CATHETER     TEE WITHOUT CARDIOVERSION N/A 01/07/2018   Procedure: TRANSESOPHAGEAL ECHOCARDIOGRAM (TEE);  Surgeon: Perla Evalene PARAS, MD;  Location: [facility] ORS;  Service: Cardiovascular;  Laterality: N/A;   Patient Active Problem List   Diagnosis Date Noted   CAD S/P percutaneous coronary angioplasty 03/17/2024   Non-ST elevation (NSTEMI) myocardial infarction (HCC) 03/16/2024   Hypervolemia 03/14/2024   Hypoxic respiratory failure (HCC) 03/14/2024   Demand ischemia (HCC) 03/14/2024   Left hip pain 01/30/2024    Acute on chronic heart failure with preserved ejection fraction (HFpEF) (HCC) 12/26/2023   Community acquired pneumonia 12/24/2023   Acute respiratory failure with hypoxia (HCC) 12/23/2023   SVT (supraventricular tachycardia) (HCC) 08/10/2023   Atrial fibrillation with rapid ventricular response (HCC) 07/03/2023   Atrial fibrillation with RVR (HCC) 07/02/2023   Fall 06/15/2023   Non-compliance with renal dialysis (HCC) 05/02/2023   Obesity (BMI 30-39.9) 04/30/2023   Volume overload 04/29/2023   Renal osteodystrophy 11/25/2022   Unsteady gait 10/05/2022   Steal syndrome of dialysis vascular access (HCC) 05/28/2022   Hydronephrosis, left 05/04/2022   Leukocytosis 05/04/2022   Chest pain 01/13/2022   Diabetic retinopathy associated with diabetes mellitus due to underlying condition (HCC) 01/13/2022   ESRD on hemodialysis (HCC) 01/13/2022   Deafness in right ear 08/11/2021   Open wound 01/25/2021   History of colon polyps 10/15/2020   Acute cholecystitis without calculus 09/09/2020   Type 2 diabetes mellitus, with long-term current use of insulin  (HCC) 09/09/2020   Cryptogenic stroke (HCC) 07/13/2020   History of loop recorder 07/13/2020   Elevated troponin 04/03/2020   Acquired trigger finger 06/08/2019   Lymphedema 06/08/2019   Anemia 04/17/2019   Swelling of both lower extremities 01/10/2019   Facial droop 04/09/2018   Daytime somnolence 03/30/2018   Carotid artery disease (HCC) 02/03/2018   Intracranial vascular stenosis 09/12/2017   Cough 01/20/2017   Bell's palsy 11/10/2016   History of CVA (cerebrovascular accident) 11/10/2016   Benign localized hyperplasia of prostate with  urinary obstruction 10/27/2016   History of nephrolithiasis 10/27/2016   TIA (transient ischemic attack) 10/20/2016   Near syncope 06/23/2016   Organic impotence 10/01/2015   Neuropathy 08/06/2015   Health care maintenance 08/06/2015   Hypertension 08/06/2015   Hypothyroidism 10/10/2013    Microalbuminuria 10/10/2013   Hyperlipidemia 10/10/2013   B12 deficiency 10/10/2013   Environmental allergies 07/04/2013    ONSET DATE: 3 years ago  REFERRING DIAG: unsteady gait  THERAPY DIAG:  No diagnosis found.  Rationale for Evaluation and Treatment: Rehabilitation  SUBJECTIVE:                                                                                                                                                                                             SUBJECTIVE STATEMENT:   ***  Pt accompanied by: self  PERTINENT HISTORY: Patient returning to PT s/p hospitalization for pneumonia, , SVT, ESRD on hypodialysis. PMH DM, HTN, Anemia, Bells palsy, Hypothyroidism, Kidney stones, stroke and pseudotumour cerebri. Macular degeneration with impaired visual acuity   PAIN:  Are you having pain? Some L foot numbness/neuropathy  PRECAUTIONS: Fall  RED FLAGS: None   WEIGHT BEARING RESTRICTIONS: No  FALLS: Has patient fallen in last 6 months? No  LIVING ENVIRONMENT: Lives with: lives alone Lives in: House/apartment Stairs: yes stairs in house Has following equipment at home: Single point cane  PLOF: Independent with basic ADLs  PATIENT GOALS: balance, strength  OBJECTIVE:  Note: Objective measures were completed at Evaluation unless otherwise noted.  DIAGNOSTIC FINDINGS: IMPRESSION: Interval resolution of previously noted trace left parafalcine subdural hematoma. No acute intracranial process.  COGNITION: Overall cognitive status: Within functional limits for tasks assessed   SENSATION: WFL  COORDINATION: Heel slide: WFL RLE; slight decrease with LLE   MUSCLE TONE: WFL  POSTURE: rounded shoulders, forward head, and posterior pelvic tilt  LOWER EXTREMITY ROM:     Active  Right Eval Left Eval  Hip flexion    Hip extension    Hip abduction    Hip adduction    Hip internal rotation    Hip external rotation    Knee flexion    Knee extension     Ankle dorsiflexion  -3  Ankle plantarflexion  35  Ankle inversion  10  Ankle eversion  8   (Blank rows = not tested)  LOWER EXTREMITY MMT:    MMT Right Eval Left Eval  Hip flexion 4 4  Hip extension    Hip abduction 3 3  Hip adduction 3+ 3+  Hip internal rotation    Hip external rotation    Knee flexion 4- 4-  Knee  extension 4 4  Ankle dorsiflexion 3 2+  Ankle plantarflexion 3+ 3  Ankle inversion    Ankle eversion    (Blank rows = not tested)  BED MOBILITY:  Not tested  TRANSFERS: Sit to stand: CGA  Assistive device utilized: None     Stand to sit: CGA  Assistive device utilized: None     Chair to chair: CGA  Assistive device utilized: None       RAMP:  Not tested  CURB:  Findings: needs UE support to step up  STAIRS: heavy BUE support step over step negotiation.  GAIT: Findings: Gait Characteristics: decreased stride length, Left foot flat, wide BOS, and poor foot clearance- Left, Distance walked: 60 ft, Assistive device utilized:None, Level of assistance: CGA, and Comments: increased L ankle rolling due to not wearing AFO  FUNCTIONAL TESTS:  5 times sit to stand: 30.53 with 4 posterior LOB 6 minute walk test: perform next session  Berg Balance Scale: 26  PATIENT SURVEYS:  LEFS give next session                                                                                                                               TREATMENT DATE: 04/19/24  *** TherAct:   Sit to stand with UE on LE's, due to imbalance and weakness in the LE's, 2x10  Pt with instability during 5/10 first attempts   Neuro: ?   Lateral weight shifts on wobble board, x10  Forward weight shifts on wobble board, x5 pain noted in the R knee and unable to continue  Patient also uses dynamic stability setting physical therapist pointing to spaces on the screen the patient has difficulty with during Pap for another 5 minutes.   TherEx:  Seated LAQ with 4# AW donned, 2x10, cues  for full extension and keeping toes pointed up  Seated march 2 sets of 10 reps each lower extremity with 4 pound ankle weights  Seated hip adduction squeezes, 3 sec holds, 2x10  Seated hip adduction squeeze with calf raises, 4# AW donned, 2x10  Seated heel raises with forefoot on half foam roller for increased range of activity, 4# AW donned, 2x10  Seated Seated toes raises with forefoot on half foam roller for increased range of activity, 4# AW donned, 2x10  Pt required occasional rest breaks due fatigue, PT was attentive to when pt appeared to be tired or winded in order to prevent excessive fatigue.   PATIENT EDUCATION: Education details: goals, POC, HEP Person educated: Patient Education method: Explanation, Demonstration, Tactile cues, Verbal cues, and Handouts Education comprehension: verbalized understanding, returned demonstration, verbal cues required, tactile cues required, and needs further education  HOME EXERCISE PROGRAM: Access Code: [email or web address] URL: [email or web address] Date: 12/29/2023 Prepared by: Marina  Moser  Exercises - Leg Extension  - 1 x daily - 7 x weekly - 2 sets - 10 reps - 5 hold - Seated March  - 1 x daily - 7 x  weekly - 2 sets - 10 reps - 5 hold - Seated Heel Raise  - 1 x daily - 7 x weekly - 2 sets - 10 reps - 5 hold  GOALS: Goals reviewed with patient? Yes  SHORT TERM GOALS: Target date: 01/26/2024    Patient will be independent in home exercise program to improve strength/mobility for better functional independence with ADLs.  Baseline: Goal status: INITIAL  LONG TERM GOALS: Target date: 03/22/2024    Patient (< 69 years old) will complete five times sit to stand test in < 10 seconds indicating an increased LE strength and improved balance.  Baseline: 30.53 with 4 posterior LOB 03/29/24: 22.61 with 3 posterior LOB Goal status: PROGRESSING  2.   Patient will increase Berg Balance score by >45/56 to demonstrate decreased fall  risk during functional activities.  Baseline: 5/5: 26  03/29/24: 32 Goal status: INITIAL  3.    Patient will increase six minute walk test distance to >1200 for progression to age norm community ambulator and improve gait ability  Baseline: perform next session 5/12: 710 ft  03/29/24: 770' Goal status: PROGRESSING  4.  Patient will score > 60/80 on LEFS for improved functional mobility and quality of life.  Baseline: perform next session 5/12: 43 03/29/24: 39/80 Goal status: INITIAL    ASSESSMENT:  CLINICAL IMPRESSION:   ***    OBJECTIVE IMPAIRMENTS: Abnormal gait, cardiopulmonary status limiting activity, decreased activity tolerance, decreased balance, decreased coordination, decreased endurance, decreased knowledge of use of DME, decreased mobility, difficulty walking, decreased ROM, decreased strength, dizziness, impaired perceived functional ability, impaired flexibility, impaired vision/preception, improper body mechanics, postural dysfunction, and obesity.   ACTIVITY LIMITATIONS: carrying, lifting, bending, sitting, standing, squatting, stairs, transfers, bed mobility, toileting, dressing, reach over head, hygiene/grooming, locomotion level, and caring for others  PARTICIPATION LIMITATIONS: meal prep, cleaning, laundry, driving, shopping, community activity, occupation, and yard work  PERSONAL FACTORS: Age, Behavior pattern, Fitness, Past/current experiences, Time since onset of injury/illness/exacerbation, Transportation, and 3+ comorbidities: DM, HTN, Anemia, Bells palsy, Hypothyroidism, Kidney stones, stroke and pseudotumour cerebri. Macular degeneration with impaired visual acuity  are also affecting patient's functional outcome.   REHAB POTENTIAL: Good  CLINICAL DECISION MAKING: Evolving/moderate complexity  EVALUATION COMPLEXITY: Moderate  PLAN:  PT FREQUENCY: 2x/week  PT DURATION: 12 weeks  PLANNED INTERVENTIONS: 97164- PT Re-evaluation, 97750- Physical  Performance Testing, 97110-Therapeutic exercises, 97530- Therapeutic activity, [id number]- Neuromuscular re-education, 97535- Self Care, [id number]- Manual therapy, [id number]- Gait training, [phone]- Orthotic Initial, [phone]- Orthotic/Prosthetic subsequent, [phone]- Canalith repositioning, [id number]- Splinting, [id number]- Electrical stimulation (unattended), [phone]- Electrical stimulation (manual), [id number]- Vasopneumatic device, [id number]- Ultrasound, [id number]- Traction (mechanical), [id number]- Ionotophoresis 4mg /ml Dexamethasone , Patient/Family education, Balance training, Stair training, Taping, Dry Needling, Joint mobilization, Spinal mobilization, Scar mobilization, Compression bandaging, Vestibular training, Visual/preceptual remediation/compensation, Cognitive remediation, DME instructions, Cryotherapy, Moist heat, and Biofeedback  PLAN FOR NEXT SESSION:   Korebalance balance, strength, L ankle strength, stairs, LE endurance   Fonda Simpers, PT, DPT Physical Therapist - [place]  [facility]  04/19/24, 7:55 AM

## 2024-04-21 ENCOUNTER — Ambulatory Visit

## 2024-04-28 ENCOUNTER — Ambulatory Visit: Attending: Internal Medicine

## 2024-04-28 DIAGNOSIS — R262 Difficulty in walking, not elsewhere classified: Secondary | ICD-10-CM | POA: Diagnosis present

## 2024-04-28 DIAGNOSIS — R269 Unspecified abnormalities of gait and mobility: Secondary | ICD-10-CM | POA: Insufficient documentation

## 2024-04-28 DIAGNOSIS — R278 Other lack of coordination: Secondary | ICD-10-CM | POA: Insufficient documentation

## 2024-04-28 DIAGNOSIS — R2689 Other abnormalities of gait and mobility: Secondary | ICD-10-CM | POA: Insufficient documentation

## 2024-04-28 DIAGNOSIS — M6281 Muscle weakness (generalized): Secondary | ICD-10-CM | POA: Diagnosis present

## 2024-04-28 DIAGNOSIS — R2681 Unsteadiness on feet: Secondary | ICD-10-CM | POA: Insufficient documentation

## 2024-04-28 NOTE — Therapy (Signed)
 OUTPATIENT PHYSICAL THERAPY NEURO TREATMENT/PHYSICAL THERAPY PROGRESS NOTE   Dates of reporting period  02/18/24   to   04/28/24   Patient Name: Gregory Crane MRN: [id number] DOB:Oct 03, 1970, 53 y.o., male Today's Date: 04/28/2024   PCP: Glendia Shad  REFERRING PROVIDER: Glendia Shad  END OF SESSION:  PT End of Session - 04/28/24 1109     Visit Number 20    Number of Visits 39    Date for PT Re-Evaluation 06/21/24    Progress Note Due on Visit 20    PT Start Time 1105    PT Stop Time 1145    PT Time Calculation (min) 40 min    Equipment Utilized During Treatment Gait belt    Activity Tolerance Patient tolerated treatment well    Behavior During Therapy [facility] for tasks assessed/performed           Past Medical History:  Diagnosis Date   Allergy    Anemia    Bell's palsy    Diabetes mellitus without complication (HCC)    diet controlled   Fall 07/2023   Hypertension    Hypothyroidism    Kidney stones    Pseudotumor cerebri    Stroke [facility])    Past Surgical History:  Procedure Laterality Date   COLONOSCOPY WITH PROPOFOL  N/A 03/30/2020   Procedure: COLONOSCOPY WITH PROPOFOL ;  Surgeon: Maryruth Ole DASEN, MD;  Location: [facility] ENDOSCOPY;  Service: Endoscopy;  Laterality: N/A;   CORONARY STENT INTERVENTION N/A 03/17/2024   Procedure: CORONARY STENT INTERVENTION;  Surgeon: Swaziland, Peter M, MD;  Location: [facility] INVASIVE CV LAB;  Service: Cardiovascular;  Laterality: N/A;   LEFT HEART CATH AND CORONARY ANGIOGRAPHY N/A 03/17/2024   Procedure: LEFT HEART CATH AND CORONARY ANGIOGRAPHY;  Surgeon: Swaziland, Peter M, MD;  Location: [facility] INVASIVE CV LAB;  Service: Cardiovascular;  Laterality: N/A;   LOOP RECORDER INSERTION N/A 01/27/2018   Procedure: LOOP RECORDER INSERTION;  Surgeon: Fernande Elspeth BROCKS, MD;  Location: [facility] INVASIVE CV LAB;  Service: Cardiovascular;  Laterality: N/A;   LUMBAR PUNCTURE     as child   NO PAST SURGERIES     REMOVAL OF A DIALYSIS CATHETER     TEE WITHOUT  CARDIOVERSION N/A 01/07/2018   Procedure: TRANSESOPHAGEAL ECHOCARDIOGRAM (TEE);  Surgeon: Perla Evalene PARAS, MD;  Location: [facility] ORS;  Service: Cardiovascular;  Laterality: N/A;   Patient Active Problem List   Diagnosis Date Noted   CAD S/P percutaneous coronary angioplasty 03/17/2024   Non-ST elevation (NSTEMI) myocardial infarction (HCC) 03/16/2024   Hypervolemia 03/14/2024   Hypoxic respiratory failure (HCC) 03/14/2024   Demand ischemia (HCC) 03/14/2024   Left hip pain 01/30/2024   Acute on chronic heart failure with preserved ejection fraction (HFpEF) (HCC) 12/26/2023   Community acquired pneumonia 12/24/2023   Acute respiratory failure with hypoxia (HCC) 12/23/2023   SVT (supraventricular tachycardia) (HCC) 08/10/2023   Atrial fibrillation with rapid ventricular response (HCC) 07/03/2023   Atrial fibrillation with RVR (HCC) 07/02/2023   Fall 06/15/2023   Non-compliance with renal dialysis (HCC) 05/02/2023   Obesity (BMI 30-39.9) 04/30/2023   Volume overload 04/29/2023   Renal osteodystrophy 11/25/2022   Unsteady gait 10/05/2022   Steal syndrome of dialysis vascular access (HCC) 05/28/2022   Hydronephrosis, left 05/04/2022   Leukocytosis 05/04/2022   Chest pain 01/13/2022   Diabetic retinopathy associated with diabetes mellitus due to underlying condition (HCC) 01/13/2022   ESRD on hemodialysis (HCC) 01/13/2022   Deafness in right ear 08/11/2021   Open wound 01/25/2021  History of colon polyps 10/15/2020   Acute cholecystitis without calculus 09/09/2020   Type 2 diabetes mellitus, with long-term current use of insulin  (HCC) 09/09/2020   Cryptogenic stroke (HCC) 07/13/2020   History of loop recorder 07/13/2020   Elevated troponin 04/03/2020   Acquired trigger finger 06/08/2019   Lymphedema 06/08/2019   Anemia 04/17/2019   Swelling of both lower extremities 01/10/2019   Facial droop 04/09/2018   Daytime somnolence 03/30/2018   Carotid artery disease (HCC) 02/03/2018    Intracranial vascular stenosis 09/12/2017   Cough 01/20/2017   Bell's palsy 11/10/2016   History of CVA (cerebrovascular accident) 11/10/2016   Benign localized hyperplasia of prostate with urinary obstruction 10/27/2016   History of nephrolithiasis 10/27/2016   TIA (transient ischemic attack) 10/20/2016   Near syncope 06/23/2016   Organic impotence 10/01/2015   Neuropathy 08/06/2015   Health care maintenance 08/06/2015   Hypertension 08/06/2015   Hypothyroidism 10/10/2013   Microalbuminuria 10/10/2013   Hyperlipidemia 10/10/2013   B12 deficiency 10/10/2013   Environmental allergies 07/04/2013    ONSET DATE: 3 years ago  REFERRING DIAG: unsteady gait  THERAPY DIAG:  Muscle weakness (generalized)  Unsteadiness on feet  Other abnormalities of gait and mobility  Difficulty in walking, not elsewhere classified  Abnormality of gait and mobility  Other lack of coordination  Rationale for Evaluation and Treatment: Rehabilitation  SUBJECTIVE:                                                                                                                                                                                             SUBJECTIVE STATEMENT:   Pt reports he is in a much better head-space currently and is ready to begin therapy again.  Pt notes he had to take a hiatus due to the pain in his back and the quad was weak for some reason and he couldn't even stand up over the past several weeks.  Pt accompanied by: self  PERTINENT HISTORY: Patient returning to PT s/p hospitalization for pneumonia, , SVT, ESRD on hypodialysis. PMH DM, HTN, Anemia, Bells palsy, Hypothyroidism, Kidney stones, stroke and pseudotumour cerebri. Macular degeneration with impaired visual acuity   PAIN:  Are you having pain? Some L foot numbness/neuropathy  PRECAUTIONS: Fall  RED FLAGS: None   WEIGHT BEARING RESTRICTIONS: No  FALLS: Has patient fallen in last 6 months? No  LIVING  ENVIRONMENT: Lives with: lives alone Lives in: House/apartment Stairs: yes stairs in house Has following equipment at home: Single point cane  PLOF: Independent with basic ADLs  PATIENT GOALS: balance, strength  OBJECTIVE:  Note: Objective measures were completed at Evaluation unless  otherwise noted.  DIAGNOSTIC FINDINGS: IMPRESSION: Interval resolution of previously noted trace left parafalcine subdural hematoma. No acute intracranial process.  COGNITION: Overall cognitive status: Within functional limits for tasks assessed   SENSATION: WFL  COORDINATION: Heel slide: WFL RLE; slight decrease with LLE   MUSCLE TONE: WFL  POSTURE: rounded shoulders, forward head, and posterior pelvic tilt  LOWER EXTREMITY ROM:     Active  Right Eval Left Eval  Hip flexion    Hip extension    Hip abduction    Hip adduction    Hip internal rotation    Hip external rotation    Knee flexion    Knee extension    Ankle dorsiflexion  -3  Ankle plantarflexion  35  Ankle inversion  10  Ankle eversion  8   (Blank rows = not tested)  LOWER EXTREMITY MMT:    MMT Right Eval Left Eval  Hip flexion 4 4  Hip extension    Hip abduction 3 3  Hip adduction 3+ 3+  Hip internal rotation    Hip external rotation    Knee flexion 4- 4-  Knee extension 4 4  Ankle dorsiflexion 3 2+  Ankle plantarflexion 3+ 3  Ankle inversion    Ankle eversion    (Blank rows = not tested)  BED MOBILITY:  Not tested  TRANSFERS: Sit to stand: CGA  Assistive device utilized: None     Stand to sit: CGA  Assistive device utilized: None     Chair to chair: CGA  Assistive device utilized: None       RAMP:  Not tested  CURB:  Findings: needs UE support to step up  STAIRS: heavy BUE support step over step negotiation.  GAIT: Findings: Gait Characteristics: decreased stride length, Left foot flat, wide BOS, and poor foot clearance- Left, Distance walked: 60 ft, Assistive device utilized:None, Level of  assistance: CGA, and Comments: increased L ankle rolling due to not wearing AFO  FUNCTIONAL TESTS:  5 times sit to stand: 30.53 with 4 posterior LOB 6 minute walk test: perform next session  Berg Balance Scale: 26  PATIENT SURVEYS:  LEFS give next session                                                                                                                               TREATMENT DATE: 04/28/24   Physical Performance Testing:   Patient demonstrates increased fall risk as noted by score of  38/56 on Berg Balance Scale.  (<36= high risk for falls, close to 100%; 37-45 significant >80%; 46-51 moderate >50%; 52-55 lower >25%)  LEFS: Extreme difficulty/unable (0), Quite a bit of difficulty (1), Moderate difficulty (2), Little difficulty (3), No difficulty (4) Survey date:  04/28/24  Any of your usual work, housework or school activities 2  2. Usual hobbies, recreational or sporting activities 2  3. Getting into/out of the bath 4  4. Walking between rooms 3  5.  Putting on socks/shoes 4  6. Squatting  1  7. Lifting an object, like a bag of groceries from the floor 3  8. Performing light activities around your home 3  9. Performing heavy activities around your home 1  10. Getting into/out of a car 4  11. Walking 2 blocks 1  12. Walking 1 mile 0  13. Going up/down 10 stairs (1 flight) 0  14. Standing for 1 hour 2  15.  sitting for 1 hour 4  16. Running on even ground 0  17. Running on uneven ground 0  18. Making sharp turns while running fast 0  19. Hopping  0  20. Rolling over in bed 3  Score total:  37     Five times Sit to Stand Test (FTSS)  TIME: 26.59 sec  Cut off scores indicative of increased fall risk: >12 sec CVA, >16 sec PD, >13 sec vestibular (ANPTA Core Set of Outcome Measures for Adults with Neurologic Conditions, 2018)   6 Min Walk Test:  Instructed patient to ambulate as quickly and as safely as possible for 6 minutes using LRAD. Patient was allowed  to take standing rest breaks without stopping the test, but if the patient required a sitting rest break the clock would be stopped and the test would be over.  Results: 802 feet  without AD and supervision. Results indicate that the patient has reduced endurance with ambulation compared to age matched norms.  Age Matched Norms (in meters): 29-69 yo M: 17 F: 61, 52-79 yo M: 20 F: 471, 38-89 yo M: 417 F: 392 MDC: 58.21 meters (190.98 feet) or 50 meters (ANPTA Core Set of Outcome Measures for Adults with Neurologic Conditions, 2018)     PATIENT EDUCATION: Education details: goals, POC, HEP Person educated: Patient Education method: Explanation, Demonstration, Tactile cues, Verbal cues, and Handouts Education comprehension: verbalized understanding, returned demonstration, verbal cues required, tactile cues required, and needs further education  HOME EXERCISE PROGRAM: Access Code: [email or web address] URL: [email or web address] Date: 12/29/2023 Prepared by: Marina  Moser  Exercises - Leg Extension  - 1 x daily - 7 x weekly - 2 sets - 10 reps - 5 hold - Seated March  - 1 x daily - 7 x weekly - 2 sets - 10 reps - 5 hold - Seated Heel Raise  - 1 x daily - 7 x weekly - 2 sets - 10 reps - 5 hold  GOALS: Goals reviewed with patient? Yes  SHORT TERM GOALS: Target date: 01/26/2024  Patient will be independent in home exercise program to improve strength/mobility for better functional independence with ADLs.  Baseline: Goal status: INITIAL  LONG TERM GOALS: Target date: 03/22/2024  Patient (< 13 years old) will complete five times sit to stand test in < 10 seconds indicating an increased LE strength and improved balance.  Baseline: 30.53 with 4 posterior LOB 03/29/24: 22.61 with 3 posterior LOB 04/28/24: 26.59 with 4 posterior LOB Goal status: PROGRESSING  2.   Patient will increase Berg Balance score by >45/56 to demonstrate decreased fall risk during functional activities.  Baseline:  5/5: 26  03/29/24: 32 04/28/24:  Goal status: INITIAL  3.    Patient will increase six minute walk test distance to >1200 for progression to age norm community ambulator and improve gait ability  Baseline: perform next session 5/12: 710 ft  03/29/24: 770' 04/28/24: 802' with one standing rest break Goal status: PROGRESSING  4.  Patient will score > 60/80 on LEFS for improved functional  mobility and quality of life.  Baseline: perform next session 5/12: 43 03/29/24: 39/80 04/28/24: 37/80 Goal status: INITIAL    ASSESSMENT:  CLINICAL IMPRESSION:   Pt has made some progress towards goals, noting an increase in the ability to ambulate longer distance on the 6 minute walk test.  Pt otherwise declined in a few areas, however pt has not been in therapy the past few visits due to other conflicting issues.  Patient's condition has the potential to improve in response to therapy. Maximum improvement is yet to be obtained. The anticipated improvement is attainable and reasonable in a generally predictable time.  Pt will continue to benefit from skilled therapy to address remaining deficits in order to improve overall QoL and return to PLOF.       OBJECTIVE IMPAIRMENTS: Abnormal gait, cardiopulmonary status limiting activity, decreased activity tolerance, decreased balance, decreased coordination, decreased endurance, decreased knowledge of use of DME, decreased mobility, difficulty walking, decreased ROM, decreased strength, dizziness, impaired perceived functional ability, impaired flexibility, impaired vision/preception, improper body mechanics, postural dysfunction, and obesity.   ACTIVITY LIMITATIONS: carrying, lifting, bending, sitting, standing, squatting, stairs, transfers, bed mobility, toileting, dressing, reach over head, hygiene/grooming, locomotion level, and caring for others  PARTICIPATION LIMITATIONS: meal prep, cleaning, laundry, driving, shopping, community activity, occupation, and yard  work  PERSONAL FACTORS: Age, Behavior pattern, Fitness, Past/current experiences, Time since onset of injury/illness/exacerbation, Transportation, and 3+ comorbidities: DM, HTN, Anemia, Bells palsy, Hypothyroidism, Kidney stones, stroke and pseudotumour cerebri. Macular degeneration with impaired visual acuity  are also affecting patient's functional outcome.   REHAB POTENTIAL: Good  CLINICAL DECISION MAKING: Evolving/moderate complexity  EVALUATION COMPLEXITY: Moderate  PLAN:  PT FREQUENCY: 2x/week  PT DURATION: 12 weeks  PLANNED INTERVENTIONS: 97164- PT Re-evaluation, 97750- Physical Performance Testing, 97110-Therapeutic exercises, 97530- Therapeutic activity, [id number]- Neuromuscular re-education, 97535- Self Care, [id number]- Manual therapy, [id number]- Gait training, [phone]- Orthotic Initial, [phone]- Orthotic/Prosthetic subsequent, [phone]- Canalith repositioning, [id number]- Splinting, [id number]- Electrical stimulation (unattended), [phone]- Electrical stimulation (manual), [id number]- Vasopneumatic device, [id number]- Ultrasound, [id number]- Traction (mechanical), [id number]- Ionotophoresis 4mg /ml Dexamethasone , Patient/Family education, Balance training, Stair training, Taping, Dry Needling, Joint mobilization, Spinal mobilization, Scar mobilization, Compression bandaging, Vestibular training, Visual/preceptual remediation/compensation, Cognitive remediation, DME instructions, Cryotherapy, Moist heat, and Biofeedback  PLAN FOR NEXT SESSION:   Korebalance balance, strength, L ankle strength, stairs, LE endurance   Fonda Simpers, PT, DPT Physical Therapist - [facility]  [facility]  04/28/24, 2:05 PM

## 2024-04-29 ENCOUNTER — Other Ambulatory Visit: Payer: Self-pay | Admitting: Internal Medicine

## 2024-04-30 ENCOUNTER — Ambulatory Visit: Attending: Internal Medicine

## 2024-04-30 ENCOUNTER — Ambulatory Visit: Admitting: Internal Medicine

## 2024-04-30 VITALS — BP 128/70 | HR 67 | Resp 16 | Ht 72.0 in | Wt 267.0 lb

## 2024-04-30 DIAGNOSIS — I4891 Unspecified atrial fibrillation: Secondary | ICD-10-CM

## 2024-04-30 DIAGNOSIS — N186 End stage renal disease: Secondary | ICD-10-CM

## 2024-04-30 DIAGNOSIS — R001 Bradycardia, unspecified: Secondary | ICD-10-CM | POA: Diagnosis not present

## 2024-04-30 DIAGNOSIS — I779 Disorder of arteries and arterioles, unspecified: Secondary | ICD-10-CM | POA: Diagnosis not present

## 2024-04-30 DIAGNOSIS — M7989 Other specified soft tissue disorders: Secondary | ICD-10-CM | POA: Diagnosis not present

## 2024-04-30 DIAGNOSIS — E1122 Type 2 diabetes mellitus with diabetic chronic kidney disease: Secondary | ICD-10-CM

## 2024-04-30 DIAGNOSIS — E039 Hypothyroidism, unspecified: Secondary | ICD-10-CM

## 2024-04-30 DIAGNOSIS — I10 Essential (primary) hypertension: Secondary | ICD-10-CM

## 2024-04-30 DIAGNOSIS — Z992 Dependence on renal dialysis: Secondary | ICD-10-CM

## 2024-04-30 DIAGNOSIS — Z8673 Personal history of transient ischemic attack (TIA), and cerebral infarction without residual deficits: Secondary | ICD-10-CM

## 2024-04-30 DIAGNOSIS — I2510 Atherosclerotic heart disease of native coronary artery without angina pectoris: Secondary | ICD-10-CM

## 2024-04-30 DIAGNOSIS — G629 Polyneuropathy, unspecified: Secondary | ICD-10-CM

## 2024-04-30 DIAGNOSIS — E782 Mixed hyperlipidemia: Secondary | ICD-10-CM

## 2024-04-30 DIAGNOSIS — E08319 Diabetes mellitus due to underlying condition with unspecified diabetic retinopathy without macular edema: Secondary | ICD-10-CM

## 2024-04-30 DIAGNOSIS — Z91158 Patient's noncompliance with renal dialysis for other reason: Secondary | ICD-10-CM

## 2024-04-30 DIAGNOSIS — D649 Anemia, unspecified: Secondary | ICD-10-CM

## 2024-04-30 NOTE — Patient Instructions (Signed)
 Metoprolol  - need to know the dose you are takig.

## 2024-04-30 NOTE — Progress Notes (Addendum)
 Subjective:    Patient ID: Gregory Crane, male    DOB: 06-Nov-1970, 53 y.o.   MRN: [id number]  Patient here for  Chief Complaint  Patient presents with   Medical Management of Chronic Issues    HPI Here for a scheduled follow up -  follow up regarding diabetes, hypertension, hypercholesterolemia and hypothyroidism. Has ESRD on dialysis. Admitted 03/14/24 - 03/18/24 - after presenting with acute hypoxic respirator failure in the setting of volume overload. Had missed dialysis.  Required bipap initially. Also was noted to have significantly elevated troponins - NSTEMI. Is s/p LHC with PCI to mid RCA with DES.  Cardiology advised aspirin , Plavix  for 1 year. Resumed eliquis . Has been going to PT - unsteadiness. Discussed importance of PT and balance training and strengthening. Want to avoid falls. Denies any recent falls. No chest pain. Breathing stable. Is going to dialysis 2 days per week. States he is tolerating 2 days per week. Declines three days per week. Does report that recently, his heart rate has been in the 40s at dialysis. Denies in syncope or near syncope. No increased dizziness. Unsure how much metoprolol  he is taking. Unclear if taking thyroid  medication. He does report still with decreased energy - since recent hospitalization. Has improved some. Appetite is better.   Overdue colonoscopy    Past Medical History:  Diagnosis Date   Allergy    Anemia    Bell's palsy    Diabetes mellitus without complication (HCC)    diet controlled   Fall 07/2023   Hypertension    Hypothyroidism    Kidney stones    Pseudotumor cerebri    Stroke [facility])    Past Surgical History:  Procedure Laterality Date   COLONOSCOPY WITH PROPOFOL  N/A 03/30/2020   Procedure: COLONOSCOPY WITH PROPOFOL ;  Surgeon: Maryruth Ole DASEN, MD;  Location: [facility] ENDOSCOPY;  Service: Endoscopy;  Laterality: N/A;   CORONARY STENT INTERVENTION N/A 03/17/2024   Procedure: CORONARY STENT INTERVENTION;  Surgeon: Swaziland,  Peter M, MD;  Location: [facility] INVASIVE CV LAB;  Service: Cardiovascular;  Laterality: N/A;   LEFT HEART CATH AND CORONARY ANGIOGRAPHY N/A 03/17/2024   Procedure: LEFT HEART CATH AND CORONARY ANGIOGRAPHY;  Surgeon: Swaziland, Peter M, MD;  Location: [facility] INVASIVE CV LAB;  Service: Cardiovascular;  Laterality: N/A;   LOOP RECORDER INSERTION N/A 01/27/2018   Procedure: LOOP RECORDER INSERTION;  Surgeon: Fernande Elspeth BROCKS, MD;  Location: [facility] INVASIVE CV LAB;  Service: Cardiovascular;  Laterality: N/A;   LUMBAR PUNCTURE     as child   NO PAST SURGERIES     REMOVAL OF A DIALYSIS CATHETER     TEE WITHOUT CARDIOVERSION N/A 01/07/2018   Procedure: TRANSESOPHAGEAL ECHOCARDIOGRAM (TEE);  Surgeon: Perla Evalene PARAS, MD;  Location: [facility] ORS;  Service: Cardiovascular;  Laterality: N/A;   Family History  Problem Relation Age of Onset   Breast cancer Mother    Diabetes Father    Diabetes Sister    Arthritis Maternal Grandmother    Diabetes Maternal Grandmother    Social History   Socioeconomic History   Marital status: Widowed    Spouse name: Clotilda   Number of children: 0   Years of education: Not on file   Highest education level: Some college, no degree  Occupational History   Occupation: disability  Tobacco Use   Smoking status: Never   Smokeless tobacco: Never  Vaping Use   Vaping status: Never Used  Substance and Sexual Activity   Alcohol use: No  Comment: last use early 2023   Drug use: No   Sexual activity: Yes  Other Topics Concern   Not on file  Social History Narrative   Wife deceased 24-Apr-2020, has 2 step children, independent at baseline.   Social Drivers of Health   Financial Resource Strain: Medium Risk (04/29/2024)   Overall Financial Resource Strain (CARDIA)    Difficulty of Paying Living Expenses: Somewhat hard  Food Insecurity: Food Insecurity Present (04/29/2024)   Hunger Vital Sign    Worried About Running Out of Food in the Last Year: Sometimes true    Ran Out of Food in  the Last Year: Never true  Transportation Needs: Unmet Transportation Needs (04/29/2024)   PRAPARE - Administrator, Civil Service (Medical): Yes    Lack of Transportation (Non-Medical): Yes  Physical Activity: Insufficiently Active (04/29/2024)   Exercise Vital Sign    Days of Exercise per Week: 1 day    Minutes of Exercise per Session: 30 min  Stress: Stress Concern Present (04/29/2024)   [employer] of Occupational Health - Occupational Stress Questionnaire    Feeling of Stress: To some extent  Social Connections: Moderately Integrated (04/29/2024)   Social Connection and Isolation Panel    Frequency of Communication with Friends and Family: More than three times a week    Frequency of Social Gatherings with Friends and Family: Once a week    Attends Religious Services: More than 4 times per year    Active Member of [employer] or Organizations: Yes    Attends Banker Meetings: More than 4 times per year    Marital Status: Widowed     Review of Systems  Constitutional:  Positive for fatigue.       Appetite is better.   HENT:  Negative for congestion and sinus pressure.   Respiratory:  Negative for cough and chest tightness.        Overall reports breathing is better/stable.   Cardiovascular:  Negative for chest pain and palpitations.       Monitoring lower extremity swelling.   Gastrointestinal:  Negative for abdominal pain, diarrhea, nausea and vomiting.  Genitourinary:  Negative for difficulty urinating and dysuria.  Musculoskeletal:  Negative for joint swelling and myalgias.       Pain - lower extremities - neuropathy pain.   Skin:  Negative for color change and rash.  Neurological:  Negative for headaches.       No increased dizziness.   Psychiatric/Behavioral:  Negative for agitation and dysphoric mood.        Objective:     BP 128/70   Pulse 67   Resp 16   Ht 6' (1.829 m)   Wt 267 lb (121.1 kg)   SpO2 98%   BMI 36.21 kg/m  Wt Readings from  Last 3 Encounters:  04/30/24 267 lb (121.1 kg)  03/18/24 245 lb 13 oz (111.5 kg)  02/23/24 271 lb (122.9 kg)    Physical Exam Vitals reviewed.  Constitutional:      General: He is not in acute distress.    Appearance: Normal appearance. He is well-developed.  HENT:     Head: Normocephalic and atraumatic.     Right Ear: External ear normal.     Left Ear: External ear normal.     Mouth/Throat:     Pharynx: No oropharyngeal exudate or posterior oropharyngeal erythema.  Eyes:     General: No scleral icterus.       Right eye: No  discharge.        Left eye: No discharge.     Conjunctiva/sclera: Conjunctivae normal.  Cardiovascular:     Rate and Rhythm: Regular rhythm.     Comments: Ventricular rate 52.  Pulmonary:     Effort: Pulmonary effort is normal. No respiratory distress.     Breath sounds: Normal breath sounds.  Abdominal:     General: Bowel sounds are normal.     Palpations: Abdomen is soft.     Tenderness: There is no abdominal tenderness.  Musculoskeletal:        General: No tenderness.     Cervical back: Neck supple. No tenderness.     Comments: Pedal and lower extremity swelling - stable   Lymphadenopathy:     Cervical: No cervical adenopathy.  Skin:    Findings: No erythema or rash.  Neurological:     Mental Status: He is alert.  Psychiatric:        Mood and Affect: Mood normal.        Behavior: Behavior normal.         Outpatient Encounter Medications as of 04/30/2024  Medication Sig   amiodarone  (PACERONE ) 200 MG tablet Take 1 tablet (200 mg total) by mouth 2 (two) times daily.   apixaban  (ELIQUIS ) 5 MG TABS tablet Take 1 tablet (5 mg total) by mouth 2 (two) times daily.   aspirin  EC 81 MG tablet Take 1 tablet (81 mg total) by mouth daily. Swallow whole.   atorvastatin  (LIPITOR ) 40 MG tablet Take 1 tablet (40 mg total) by mouth daily.   calcium  carbonate (TUMS - DOSED IN MG ELEMENTAL CALCIUM ) 500 MG chewable tablet Chew 2 tablets by mouth 3 (three)  times daily with meals.   clopidogrel  (PLAVIX ) 75 MG tablet Take 1 tablet (75 mg total) by mouth daily with breakfast.   diltiazem  (CARDIZEM  CD) 120 MG 24 hr capsule Take 1 capsule (120 mg total) by mouth daily.   dorzolamide -timolol  (COSOPT ) 2-0.5 % ophthalmic solution Place 1 drop into both eyes 2 (two) times daily.   furosemide  (LASIX ) 80 MG tablet Take 80 mg by mouth daily.   hydrALAZINE  (APRESOLINE ) 25 MG tablet Take 25 mg by mouth 2 (two) times daily.   levothyroxine  (SYNTHROID ) 175 MCG tablet TAKE 1 TABLET (175 MCG) BY MOUTH ONCE DAILY BEFORE BREAKFAST.   metoprolol  tartrate 37.5 MG TABS Take 1 tablet (37.5 mg total) by mouth 2 (two) times daily.   pantoprazole  (PROTONIX ) 40 MG tablet Take 1 tablet (40 mg total) by mouth daily. Take this medication for the month you will be taking aspirin , plavix , and Eliquis  all together.  STOP after 03/18/24   pregabalin  (LYRICA ) 100 MG capsule Take 1 capsule (100 mg total) by mouth at bedtime.   [DISCONTINUED] furosemide  (LASIX ) 40 MG tablet Take 1 tablet (40 mg total) by mouth 2 (two) times daily.   No facility-administered encounter medications on file as of 04/30/2024.     Lab Results  Component Value Date   WBC 5.8 03/18/2024   HGB 9.3 (L) 03/18/2024   HCT 27.1 (L) 03/18/2024   PLT 181 03/18/2024   GLUCOSE 120 (H) 03/18/2024   CHOL 129 10/09/2023   TRIG 146.0 10/09/2023   HDL 34.10 (L) 10/09/2023   LDLDIRECT 107.0 06/26/2021   LDLCALC 66 10/09/2023   ALT 12 03/15/2024   AST 21 03/15/2024   NA 134 (L) 03/18/2024   K 4.1 03/18/2024   CL 95 (L) 03/18/2024   CREATININE 9.58 (H) 03/18/2024  BUN 84 (H) 03/18/2024   CO2 21 (L) 03/18/2024   TSH 6.757 (H) 03/15/2024   PSA 0.48 10/04/2022   INR 1.3 (H) 08/05/2023   HGBA1C 6.2 (H) 12/24/2023    CT Chest Wo Contrast Result Date: 03/15/2024 CLINICAL DATA:  Dyspnea, chronic, central airway disease suspected EXAM: CT CHEST WITHOUT CONTRAST TECHNIQUE: Multidetector CT imaging of the chest was  performed following the standard protocol without IV contrast. RADIATION DOSE REDUCTION: This exam was performed according to the departmental dose-optimization program which includes automated exposure control, adjustment of the mA and/or kV according to patient size and/or use of iterative reconstruction technique. COMPARISON:  Radiograph earlier today.  Chest CT 12/24/2023 FINDINGS: Cardiovascular: The heart is enlarged. There are coronary artery calcifications. No pericardial effusion. Aortic atherosclerosis. No aortic aneurysm. Main pulmonary artery is dilated at 3.8 cm. Mediastinum/Nodes: Shotty mediastinal lymphadenopathy, largest lymph node measures 9 mm short axis series 2, image 60. Hilar assessment is limited in the absence of IV contrast. Patulous esophagus without wall thickening. Lungs/Pleura: Small bilateral pleural effusions, right greater than left. These have slightly diminished since April exam. Associated compressive atelectasis in the lower lobes. Multifocal ground-glass and nodular opacities throughout both lungs with areas of septal thickening. Bowing of the distal trachea and central airways. Mild central bronchial thickening. Upper Abdomen: Cholecystectomy. No acute upper abdominal findings or free fluid. Musculoskeletal: Flowing anterior osteophytes throughout the thoracic spine. There are no acute or suspicious osseous abnormalities. IMPRESSION: 1. Multifocal ground-glass and nodular opacities throughout both lungs with areas of septal thickening. Findings may represent pulmonary edema or atypical infection. 2. Small bilateral pleural effusions, right greater than left. These have slightly diminished since April exam. 3. Cardiomegaly with coronary artery calcifications. 4. Dilated main pulmonary artery, can be seen with pulmonary arterial hypertension. 5. Shotty mediastinal lymphadenopathy, likely reactive. 6. Bowing of the distal trachea and central bronchi may represent tracheobronchial  malacia. Aortic Atherosclerosis (ICD10-I70.0). Electronically Signed   By: Andrea Gasman M.D.   On: 03/15/2024 18:06   DG CHEST PORT 1 VIEW Result Date: 03/15/2024 CLINICAL DATA:  Difficulty breathing, low O2 sats, tachycardia. EXAM: PORTABLE CHEST 1 VIEW COMPARISON:  03/14/2024 and CT chest 12/24/2023. FINDINGS: Trachea is midline. Heart is enlarged. Diffuse mixed interstitial and airspace opacification appears progressive from 03/14/2024. Tiny bilateral pleural effusions. IMPRESSION: Coarsening congestive heart failure. Electronically Signed   By: Newell Eke M.D.   On: 03/15/2024 15:02   DG Chest Portable 1 View Result Date: 03/14/2024 EXAM: 1 VIEW XRAY OF THE CHEST 03/14/2024 09:23:00 PM COMPARISON: 12/23/2023 CLINICAL HISTORY: Chest pain, dyspnea. The patient has missed dialysis for one week, is complaining of being unable to breathe, has a fistula in the left arm, low O2 sats, rapid heart rate of 150, and nasal O2 placed. FINDINGS: LUNGS AND PLEURA: No focal pulmonary opacity. No pulmonary edema. No pleural effusion. No pneumothorax. Bibasilar atelectasis. HEART AND MEDIASTINUM: Mild cardiomegaly, unchanged. BONES AND SOFT TISSUES: No acute osseous abnormality. IMPRESSION: 1. Mild cardiomegaly, unchanged. 2. Bibasilar atelectasis. Electronically signed by: Franky Stanford MD 03/14/2024 09:32 PM EDT RP Workstation: [id number]       Assessment & Plan:  Bradycardia Assessment & Plan: Noted on exam. Apparently has been having issues with low heart rate at dialysis. On amiodarone  and metoprolol . EKG as outlined. Needs f/u with cardiology/EP. Also, discussed adjusting metoprolol  dose. He is unsure of exact dose he is taking. Informed him to call when home this pm and confirm dose taking, so that we can adjust medication.  Zio monitor ordered.   Orders: -     EKG 12-Lead -     LONG TERM MONITOR (3-14 DAYS); Future  Type 2 diabetes mellitus with chronic kidney disease on chronic dialysis, with  long-term current use of insulin  [facility]) Assessment & Plan: Overdue A1c check. Was seeing endocrinology. Need labs. Has labs drawn through nephrology. If unuable to add labs, will need lab draw here. Low carb diet. Check met b and A1c.    Swelling of both lower extremities Assessment & Plan: Swelling has overall improved. Continue leg elevation. Follow.    Non-compliance with renal dialysis [facility]) Assessment & Plan: Discussed with him again today. He is agreeable to dialysis two days per week. Monitor for volume overload.    ESRD on hemodialysis [facility]) Assessment & Plan: Continue hemodialysis.  Followed by nephrology. Receiving dialysis two days per week. They are following labs. Obtain labs just drawn a few days ago.    Bilateral carotid artery disease, unspecified type (HCC) Assessment & Plan: Last carotid ultrasound - <50% bilaterally.  Needs to take statin and keep blood pressure under control. Continue crestor . Blood pressure as outlined. Currently on aspirin  and plavix  and eliquis .    Atrial fibrillation, unspecified type [facility]) Assessment & Plan: With atrial fibrillation and currently on eliquis , amiodarone  and metoprolol . Having issues with bradycardia at dialysis. Pulse 52 today. EKG - SR/SB with ventricular rate 48. Discussed adjusting dose of metoprolol . He is unsure how much he is taking. Instructed to call when he returns home and confirm dose. Also discussed the need for f/u with cardiology/EP. Needs labs, including metabolic panel and tsh. Has been non compliant with taking synthroid . Need to confirm taking. Obtain labs from nephrology drawn a few days ago.    Anemia, unspecified type Assessment & Plan: Will need follow cbc. Obtain labs from dialysis.    CAD S/P percutaneous coronary angioplasty Assessment & Plan: S/p PCI mid RCA (DES) - 03/17/24. Continue risk factor modification. Continue statin. F/u with cardiology.    Diabetic retinopathy associated with diabetes  mellitus due to underlying condition, macular edema presence unspecified, unspecified laterality, unspecified retinopathy severity (HCC) Assessment & Plan: Has been followed by endocrinology previously. Confirm f/u. Check met b and A1c - overdue. Obtain labs from dialysis.    History of CVA (cerebrovascular accident) Assessment & Plan: History of CVA with previous left side weakness. Per review, on eliquis . Continue blood pressure control. Continue eliquis .    Mixed hyperlipidemia Assessment & Plan: Continue crestor . Check lipid panel.    Primary hypertension Assessment & Plan: Blood pressure as outlined. Currently on cardizem  and metoprolol . Follow pressures.    Acquired hypothyroidism Assessment & Plan: Need to confirm if taking synthryoid. Needs f/u tsh. See if can draw through dialysis labs.    Neuropathy Assessment & Plan: Continue lyrica .     I spent 45 minutes with the patient. Specifically time spent discussing recent hospitalization, current medications and current symptoms.  Time also spent discussing further w/up, evaluation and treatment.    Allena Hamilton, MD

## 2024-05-01 ENCOUNTER — Telehealth: Payer: Self-pay | Admitting: Internal Medicine

## 2024-05-01 ENCOUNTER — Ambulatory Visit: Payer: Self-pay | Admitting: Internal Medicine

## 2024-05-01 ENCOUNTER — Encounter: Payer: Self-pay | Admitting: Internal Medicine

## 2024-05-01 DIAGNOSIS — I2510 Atherosclerotic heart disease of native coronary artery without angina pectoris: Secondary | ICD-10-CM

## 2024-05-01 DIAGNOSIS — R001 Bradycardia, unspecified: Secondary | ICD-10-CM

## 2024-05-01 DIAGNOSIS — I4891 Unspecified atrial fibrillation: Secondary | ICD-10-CM

## 2024-05-01 NOTE — Assessment & Plan Note (Signed)
 With atrial fibrillation and currently on eliquis , amiodarone  and metoprolol . Having issues with bradycardia at dialysis. Pulse 52 today. EKG - SR/SB with ventricular rate 48. Discussed adjusting dose of metoprolol . He is unsure how much he is taking. Instructed to call when he returns home and confirm dose. Also discussed the need for f/u with cardiology/EP. Needs labs, including metabolic panel and tsh. Has been non compliant with taking synthroid . Need to confirm taking. Obtain labs from nephrology drawn a few days ago.

## 2024-05-01 NOTE — Assessment & Plan Note (Signed)
 Discussed with him again today. He is agreeable to dialysis two days per week. Monitor for volume overload.

## 2024-05-01 NOTE — Assessment & Plan Note (Signed)
 Continue lyrica

## 2024-05-01 NOTE — Assessment & Plan Note (Signed)
 Swelling has overall improved. Continue leg elevation. Follow.

## 2024-05-01 NOTE — Assessment & Plan Note (Addendum)
 Last carotid ultrasound - <50% bilaterally.  Needs to take statin and keep blood pressure under control. Continue crestor . Blood pressure as outlined. Currently on aspirin  and plavix  and eliquis .

## 2024-05-01 NOTE — Assessment & Plan Note (Signed)
Continue crestor. Check lipid panel.  

## 2024-05-01 NOTE — Assessment & Plan Note (Signed)
 Continue hemodialysis.  Followed by nephrology. Receiving dialysis two days per week. They are following labs. Obtain labs just drawn a few days ago.

## 2024-05-01 NOTE — Assessment & Plan Note (Signed)
 Overdue A1c check. Was seeing endocrinology. Need labs. Has labs drawn through nephrology. If unuable to add labs, will need lab draw here. Low carb diet. Check met b and A1c.

## 2024-05-01 NOTE — Assessment & Plan Note (Addendum)
 Noted on exam. Apparently has been having issues with low heart rate at dialysis. On amiodarone  and metoprolol . EKG as outlined. Needs f/u with cardiology/EP. Also, discussed adjusting metoprolol  dose. He is unsure of exact dose he is taking. Informed him to call when home this pm and confirm dose taking, so that we can adjust medication. Zio monitor ordered.

## 2024-05-01 NOTE — Assessment & Plan Note (Signed)
 S/p PCI mid RCA (DES) - 03/17/24. Continue risk factor modification. Continue statin. F/u with cardiology.

## 2024-05-01 NOTE — Telephone Encounter (Signed)
 Called and left message to check on Gregory Crane. Also, to see if was able to confirm medication. Order placed for cardiology referral/ follow up.

## 2024-05-01 NOTE — Assessment & Plan Note (Signed)
 Will need follow cbc. Obtain labs from dialysis.

## 2024-05-01 NOTE — Assessment & Plan Note (Signed)
 Need to confirm if taking synthryoid. Needs f/u tsh. See if can draw through dialysis labs.

## 2024-05-01 NOTE — Assessment & Plan Note (Signed)
 Has been followed by endocrinology previously. Confirm f/u. Check met b and A1c - overdue. Obtain labs from dialysis.

## 2024-05-01 NOTE — Assessment & Plan Note (Signed)
 Blood pressure as outlined. Currently on cardizem  and metoprolol . Follow pressures.

## 2024-05-01 NOTE — Assessment & Plan Note (Signed)
 History of CVA with previous left side weakness. Per review, on eliquis . Continue blood pressure control. Continue eliquis .

## 2024-05-02 ENCOUNTER — Other Ambulatory Visit: Payer: Self-pay | Admitting: Family

## 2024-05-03 ENCOUNTER — Ambulatory Visit

## 2024-05-03 DIAGNOSIS — R269 Unspecified abnormalities of gait and mobility: Secondary | ICD-10-CM

## 2024-05-03 DIAGNOSIS — M6281 Muscle weakness (generalized): Secondary | ICD-10-CM

## 2024-05-03 DIAGNOSIS — R2681 Unsteadiness on feet: Secondary | ICD-10-CM

## 2024-05-03 DIAGNOSIS — R262 Difficulty in walking, not elsewhere classified: Secondary | ICD-10-CM

## 2024-05-03 DIAGNOSIS — R278 Other lack of coordination: Secondary | ICD-10-CM

## 2024-05-03 DIAGNOSIS — R2689 Other abnormalities of gait and mobility: Secondary | ICD-10-CM

## 2024-05-03 NOTE — Therapy (Signed)
 OUTPATIENT PHYSICAL THERAPY NEURO TREATMENT   Patient Name: Yahir Tavano MRN: [id number] DOB:September 05, 1970, 53 y.o., male Today's Date: 05/03/2024   PCP: Glendia Shad  REFERRING PROVIDER: Glendia Shad  END OF SESSION:  PT End of Session - 05/03/24 1021     Visit Number 21    Number of Visits 39    Date for PT Re-Evaluation 06/21/24    Progress Note Due on Visit 20    PT Start Time 1020    PT Stop Time 1100    PT Time Calculation (min) 40 min    Equipment Utilized During Treatment Gait belt    Activity Tolerance Patient tolerated treatment well    Behavior During Therapy [name] for tasks assessed/performed           Past Medical History:  Diagnosis Date   Allergy    Anemia    Bell's palsy    Diabetes mellitus without complication (HCC)    diet controlled   Fall 07/2023   Hypertension    Hypothyroidism    Kidney stones    Pseudotumor cerebri    Stroke [facility])    Past Surgical History:  Procedure Laterality Date   COLONOSCOPY WITH PROPOFOL  N/A 03/30/2020   Procedure: COLONOSCOPY WITH PROPOFOL ;  Surgeon: Maryruth Ole DASEN, MD;  Location: [facility] ENDOSCOPY;  Service: Endoscopy;  Laterality: N/A;   CORONARY STENT INTERVENTION N/A 03/17/2024   Procedure: CORONARY STENT INTERVENTION;  Surgeon: Swaziland, Peter M, MD;  Location: [facility] INVASIVE CV LAB;  Service: Cardiovascular;  Laterality: N/A;   LEFT HEART CATH AND CORONARY ANGIOGRAPHY N/A 03/17/2024   Procedure: LEFT HEART CATH AND CORONARY ANGIOGRAPHY;  Surgeon: Swaziland, Peter M, MD;  Location: [facility] INVASIVE CV LAB;  Service: Cardiovascular;  Laterality: N/A;   LOOP RECORDER INSERTION N/A 01/27/2018   Procedure: LOOP RECORDER INSERTION;  Surgeon: Fernande Elspeth BROCKS, MD;  Location: [facility] INVASIVE CV LAB;  Service: Cardiovascular;  Laterality: N/A;   LUMBAR PUNCTURE     as child   NO PAST SURGERIES     REMOVAL OF A DIALYSIS CATHETER     TEE WITHOUT CARDIOVERSION N/A 01/07/2018   Procedure: TRANSESOPHAGEAL ECHOCARDIOGRAM (TEE);   Surgeon: Perla Evalene PARAS, MD;  Location: [facility] ORS;  Service: Cardiovascular;  Laterality: N/A;   Patient Active Problem List   Diagnosis Date Noted   Bradycardia 04/30/2024   CAD S/P percutaneous coronary angioplasty 03/17/2024   Non-ST elevation (NSTEMI) myocardial infarction (HCC) 03/16/2024   Hypervolemia 03/14/2024   Hypoxic respiratory failure (HCC) 03/14/2024   Demand ischemia (HCC) 03/14/2024   Left hip pain 01/30/2024   Acute on chronic heart failure with preserved ejection fraction (HFpEF) (HCC) 12/26/2023   Community acquired pneumonia 12/24/2023   Acute respiratory failure with hypoxia (HCC) 12/23/2023   SVT (supraventricular tachycardia) (HCC) 08/10/2023   Atrial fibrillation (HCC) 07/03/2023   Atrial fibrillation with RVR (HCC) 07/02/2023   Fall 06/15/2023   Non-compliance with renal dialysis (HCC) 05/02/2023   Obesity (BMI 30-39.9) 04/30/2023   Volume overload 04/29/2023   Renal osteodystrophy 11/25/2022   Unsteady gait 10/05/2022   Steal syndrome of dialysis vascular access (HCC) 05/28/2022   Hydronephrosis, left 05/04/2022   Leukocytosis 05/04/2022   Chest pain 01/13/2022   Diabetic retinopathy associated with diabetes mellitus due to underlying condition (HCC) 01/13/2022   ESRD on hemodialysis (HCC) 01/13/2022   Deafness in right ear 08/11/2021   Open wound 01/25/2021   History of colon polyps 10/15/2020   Acute cholecystitis without calculus 09/09/2020   Type 2 diabetes  mellitus, with long-term current use of insulin  (HCC) 09/09/2020   Cryptogenic stroke (HCC) 07/13/2020   History of loop recorder 07/13/2020   Elevated troponin 04/03/2020   Acquired trigger finger 06/08/2019   Lymphedema 06/08/2019   Anemia 04/17/2019   Swelling of both lower extremities 01/10/2019   Facial droop 04/09/2018   Daytime somnolence 03/30/2018   Carotid artery disease (HCC) 02/03/2018   Intracranial vascular stenosis 09/12/2017   Cough 01/20/2017   Bell's palsy 11/10/2016    History of CVA (cerebrovascular accident) 11/10/2016   Benign localized hyperplasia of prostate with urinary obstruction 10/27/2016   History of nephrolithiasis 10/27/2016   TIA (transient ischemic attack) 10/20/2016   Near syncope 06/23/2016   Organic impotence 10/01/2015   Neuropathy 08/06/2015   Health care maintenance 08/06/2015   Hypertension 08/06/2015   Hypothyroidism 10/10/2013   Microalbuminuria 10/10/2013   Hyperlipidemia 10/10/2013   B12 deficiency 10/10/2013   Environmental allergies 07/04/2013    ONSET DATE: 3 years ago  REFERRING DIAG: unsteady gait  THERAPY DIAG:  Muscle weakness (generalized)  Unsteadiness on feet  Other abnormalities of gait and mobility  Difficulty in walking, not elsewhere classified  Abnormality of gait and mobility  Other lack of coordination  Rationale for Evaluation and Treatment: Rehabilitation  SUBJECTIVE:                                                                                                                                                                                             SUBJECTIVE STATEMENT:   Pt reports he had an uneventful weekend as he usually does.  Pt denies any falls and everything have been copacetic.  Pt has noticed a little more difficulty with getting out of his chair at home.  Pt accompanied by: self  PERTINENT HISTORY: Patient returning to PT s/p hospitalization for pneumonia, , SVT, ESRD on hypodialysis. PMH DM, HTN, Anemia, Bells palsy, Hypothyroidism, Kidney stones, stroke and pseudotumour cerebri. Macular degeneration with impaired visual acuity   PAIN:  Are you having pain? Some L foot numbness/neuropathy  PRECAUTIONS: Fall  RED FLAGS: None   WEIGHT BEARING RESTRICTIONS: No  FALLS: Has patient fallen in last 6 months? No  LIVING ENVIRONMENT: Lives with: lives alone Lives in: House/apartment Stairs: yes stairs in house Has following equipment at home: Single point  cane  PLOF: Independent with basic ADLs  PATIENT GOALS: balance, strength  OBJECTIVE:  Note: Objective measures were completed at Evaluation unless otherwise noted.  DIAGNOSTIC FINDINGS: IMPRESSION: Interval resolution of previously noted trace left parafalcine subdural hematoma. No acute intracranial process.  COGNITION: Overall cognitive status: Within functional limits for tasks assessed  SENSATION: WFL  COORDINATION: Heel slide: WFL RLE; slight decrease with LLE   MUSCLE TONE: WFL  POSTURE: rounded shoulders, forward head, and posterior pelvic tilt  LOWER EXTREMITY ROM:     Active  Right Eval Left Eval  Hip flexion    Hip extension    Hip abduction    Hip adduction    Hip internal rotation    Hip external rotation    Knee flexion    Knee extension    Ankle dorsiflexion  -3  Ankle plantarflexion  35  Ankle inversion  10  Ankle eversion  8   (Blank rows = not tested)  LOWER EXTREMITY MMT:    MMT Right Eval Left Eval  Hip flexion 4 4  Hip extension    Hip abduction 3 3  Hip adduction 3+ 3+  Hip internal rotation    Hip external rotation    Knee flexion 4- 4-  Knee extension 4 4  Ankle dorsiflexion 3 2+  Ankle plantarflexion 3+ 3  Ankle inversion    Ankle eversion    (Blank rows = not tested)  BED MOBILITY:  Not tested  TRANSFERS: Sit to stand: CGA  Assistive device utilized: None     Stand to sit: CGA  Assistive device utilized: None     Chair to chair: CGA  Assistive device utilized: None       RAMP:  Not tested  CURB:  Findings: needs UE support to step up  STAIRS: heavy BUE support step over step negotiation.  GAIT: Findings: Gait Characteristics: decreased stride length, Left foot flat, wide BOS, and poor foot clearance- Left, Distance walked: 60 ft, Assistive device utilized:None, Level of assistance: CGA, and Comments: increased L ankle rolling due to not wearing AFO  FUNCTIONAL TESTS:  5 times sit to stand: 30.53 with 4  posterior LOB 6 minute walk test: perform next session  Berg Balance Scale: 26  PATIENT SURVEYS:  LEFS give next session                                                                                                                               TREATMENT DATE: 05/03/24  TherAct:   Sit to stand with UE on LE's, due to imbalance and weakness in the LE's, 2x10  Pt with instability during the final attempts when performing the first set  Briefcase carries with 15# KB's, x2 laps each UE holding  Aura carries with 15# KB's, x2 laps each UE holding    Neuro: ?   Patient participated in balance training using the [employer] on the KoreBalanceT platform for 24 minutes at stability level 6.0. The task involved navigating a digital ball across a screen and shifting the center of gravity on a dynamic platform in order to break bricks. This activity targets postural control, weight shifting, proprioception, and visual-motor coordination, and promotes core stability and reactive balance strategies. The patient required min level of assistance  and minimal verbal cueing.       PATIENT EDUCATION: Education details: goals, POC, HEP Person educated: Patient Education method: Explanation, Demonstration, Tactile cues, Verbal cues, and Handouts Education comprehension: verbalized understanding, returned demonstration, verbal cues required, tactile cues required, and needs further education  HOME EXERCISE PROGRAM: Access Code: [email or web address] URL: [email or web address] Date: 12/29/2023 Prepared by: Marina  Moser  Exercises - Leg Extension  - 1 x daily - 7 x weekly - 2 sets - 10 reps - 5 hold - Seated March  - 1 x daily - 7 x weekly - 2 sets - 10 reps - 5 hold - Seated Heel Raise  - 1 x daily - 7 x weekly - 2 sets - 10 reps - 5 hold  GOALS: Goals reviewed with patient? Yes  SHORT TERM GOALS: Target date: 01/26/2024    Patient will be independent in home exercise program to  improve strength/mobility for better functional independence with ADLs.  Baseline: Goal status: INITIAL  LONG TERM GOALS: Target date: 03/22/2024    Patient (< 15 years old) will complete five times sit to stand test in < 10 seconds indicating an increased LE strength and improved balance.  Baseline: 30.53 with 4 posterior LOB 03/29/24: 22.61 with 3 posterior LOB Goal status: PROGRESSING  2.   Patient will increase Berg Balance score by >45/56 to demonstrate decreased fall risk during functional activities.  Baseline: 5/5: 26  03/29/24: 32 Goal status: INITIAL  3.    Patient will increase six minute walk test distance to >1200 for progression to age norm community ambulator and improve gait ability  Baseline: perform next session 5/12: 710 ft  03/29/24: 770' Goal status: PROGRESSING  4.  Patient will score > 60/80 on LEFS for improved functional mobility and quality of life.  Baseline: perform next session 5/12: 43 03/29/24: 39/80 Goal status: INITIAL    ASSESSMENT:  CLINICAL IMPRESSION:   Pt performed well with the tasks given today and enjoyed participating in the [facility] exercises.  Pt does have visual deficits that limited him initially, however pt was able to track the ball better the longer he participated in the exercise.  Pt will continue to benefit from challenging overall balance levels and gait mechanics as therapy progresses.   Pt will continue to benefit from skilled therapy to address remaining deficits in order to improve overall QoL and return to PLOF.       OBJECTIVE IMPAIRMENTS: Abnormal gait, cardiopulmonary status limiting activity, decreased activity tolerance, decreased balance, decreased coordination, decreased endurance, decreased knowledge of use of DME, decreased mobility, difficulty walking, decreased ROM, decreased strength, dizziness, impaired perceived functional ability, impaired flexibility, impaired vision/preception, improper body mechanics,  postural dysfunction, and obesity.   ACTIVITY LIMITATIONS: carrying, lifting, bending, sitting, standing, squatting, stairs, transfers, bed mobility, toileting, dressing, reach over head, hygiene/grooming, locomotion level, and caring for others  PARTICIPATION LIMITATIONS: meal prep, cleaning, laundry, driving, shopping, community activity, occupation, and yard work  PERSONAL FACTORS: Age, Behavior pattern, Fitness, Past/current experiences, Time since onset of injury/illness/exacerbation, Transportation, and 3+ comorbidities: DM, HTN, Anemia, Bells palsy, Hypothyroidism, Kidney stones, stroke and pseudotumour cerebri. Macular degeneration with impaired visual acuity  are also affecting patient's functional outcome.   REHAB POTENTIAL: Good  CLINICAL DECISION MAKING: Evolving/moderate complexity  EVALUATION COMPLEXITY: Moderate  PLAN:  PT FREQUENCY: 2x/week  PT DURATION: 12 weeks  PLANNED INTERVENTIONS: 97164- PT Re-evaluation, 97750- Physical Performance Testing, 97110-Therapeutic exercises, 97530- Therapeutic activity, [id number]- Neuromuscular re-education, 97535- Self Care, 02859- Manual therapy, [id number]- Gait training,  02239- Orthotic Initial, 02236- Orthotic/Prosthetic subsequent, [phone]- Canalith repositioning, [id number]- Splinting, [phone]- Electrical stimulation (unattended), [id number]- Electrical stimulation (manual), [id number]- Vasopneumatic device, [id number]- Ultrasound, [id number]- Traction (mechanical), [id number]- Ionotophoresis 4mg /ml Dexamethasone , Patient/Family education, Balance training, Stair training, Taping, Dry Needling, Joint mobilization, Spinal mobilization, Scar mobilization, Compression bandaging, Vestibular training, Visual/preceptual remediation/compensation, Cognitive remediation, DME instructions, Cryotherapy, Moist heat, and Biofeedback  PLAN FOR NEXT SESSION:  Korebalance balance, strength, L ankle strength, stairs, LE endurance   Fonda Simpers, PT, DPT Physical Therapist - [facility]   [facility]  05/03/24, 12:04 PM

## 2024-05-04 NOTE — Telephone Encounter (Signed)
 Please contact him and confirm how he is doing. Also, confirm if how heart rate did during dialysis.

## 2024-05-04 NOTE — Telephone Encounter (Signed)
 Rx ok;d for lyrica

## 2024-05-04 NOTE — Telephone Encounter (Signed)
 FYI- I have left a message to follow up with him. I know you said you had tried to reach out to him as well

## 2024-05-05 ENCOUNTER — Ambulatory Visit: Admitting: Cardiology

## 2024-05-05 ENCOUNTER — Ambulatory Visit

## 2024-05-05 NOTE — Telephone Encounter (Signed)
 Lvm for pt to return call. Okay to relay providers questions listed below

## 2024-05-05 NOTE — Progress Notes (Deleted)
 Cardiology Office Note   Date:  05/05/2024  ID:  Gregory, Crane 06/29/1971, MRN [id number] PCP: Glendia Shad, MD  [facility] Providers Cardiologist:  Lonni LITTIE Nanas, MD { Click to update primary MD,subspecialty MD or APP then REFRESH:1}    History of Present Illness Gregory Crane is a 53 y.o. male with a past medical history of type 2 diabetes, end-stage renal disease on hemodialysis, hypertension, history of stroke, hypothyroidism, paroxysmal atrial fibrillation, CVA (2019), who is here today for follow-up.   History of a cryptogenic stroke in 2019, Loop placement for 3 years with no significant arrhythmias/atrial fibrillation noted.  Loop removed May 2023.  Echocardiogram in 2019 with normal LV function, no intracardiac source of embolism, and normal bubble study.  Hospitalized 06/2023 at hemodialysis team and noted to be in atrial fibrillation with RVR.  High-sensitivity troponin was mildly elevated at 236, felt to be demand supply mismatch in the setting of end-stage renal disease and atrial fibrillation with RVR.  Echocardiogram showed an EF of 50-55% with no RWMA.  He converted to sinus rhythm on diltiazem  drip and oral metoprolol  every 6 hours.  He was started on apixaban  5 mg twice daily for CHA2DS2-VASc score of at least 4 for stroke prophylaxis.  Metoprolol  was reduced to tartrate 25 mg twice daily in the setting of bradycardia.  He was recommended follow-up with EP for consideration of antiarrhythmic versus ablation, although it did not appear that he followed up.  After that he was lost to follow-up in our clinic.  He was then evaluated in the emergency department in [facility] by our team on 12/23/2023.  In the ED blood pressure was 161/112, heart rate of 119, respirations of 26, temperature 97.5.  He was placed on CPAP.  CMP with a potassium of 6.2, glucose 226, BUN of 88, creatinine of 10.42, and a calcium  of 7.6.  CBC with WBCs elevated 11.2, BNP was 1683.   Troponin mildly elevated 21 and 25.  CT of the chest showed multifocal pneumonia with small bilateral pleural effusions.  States he was doing fairly well until yesterday around 10 PM when he had to walk home.  He suddenly felt extremely short of breath that worsened with lying flat.  Leaning [place]  energy over slightly seem to improve breathing for short period of time.  He was walking slowly getting the ball and blowing also seem to help his breathing a little bit.  Denied having any chest pain, palpitations or lightheadedness.  He had noticed some leg swelling over the last several days and suspected that he had been overweight though he did not know how much.  He states he is intermittently compliant with medications and also does not adhere to his fluid restriction on a consistent basis.  Stated that he was at that time last dialyzed approximately 5 days prior and does not go on Saturdays anymore due to his quality of life.  He typically feels quite poorly after dialysis and does not think that he can tolerate doing 3 days a week.  With his elevated heart rate EKG concerning for SVT/atrial tachycardia.  He was started on amiodarone  with improvement in heart rates.  He was dialyzed last with ongoing management per nephrology.  He was considered stable and was able to be discharged on 12/26/2023.  Recently seen by our group on admission to [facility] and was hospitalized 7/20 - 03/15/2024 for dyspnea and elevated high-sensitivity troponin.  Stated when he presented to the  emergency department he had worsening shortness of breath.  There had been a 10-day And dialysis sessions.  He was noted to be in atrial flutter with rapid ventricular response.  Given tachyarrhythmia patient was started on amiodarone .  While in the emergency department patient noted to have increased work of breathing and was ultimately placed on BiPAP for airway management.  Nephrology was consulted to assist with volume management.   Given atrial flutter with RVR as well as persistent dyspnea despite hemodialysis today cardiology was consulted.  High-sensitivity troponins peaked at 7307.  He was taken to the Cath Lab and had left heart catheterization with PCI to the mid RCA with DES.  Recommendation was to continue DAPT with aspirin  81 mg and clopidogrel  75 mg daily for 1 year.  He had hemodialysis on the 21st, 22nd, 24th.  He was chest pain-free and felt better.  He was encouraged to remain compliant with his dialysis schedule.  He was continued on amiodarone , metoprolol , and apixaban .   He returns to clinic today  ROS: 10 point review of systems has been reviewed and considered negative except ones been listed in HPI  Studies Reviewed     [facility] 03/17/2024   1st Diag lesion is 70% stenosed.   Prox LAD to Mid LAD lesion is 50% stenosed.   Mid Cx lesion is 50% stenosed.   Prox RCA lesion is 90% stenosed.   Mid RCA lesion is 50% stenosed.   A drug-eluting stent was successfully placed using a STENT ONYX FRONTIER 2.25X12.   Post intervention, there is a 0% residual stenosis.   LV end diastolic pressure is mildly elevated.   Recommend to resume Apixaban , at currently prescribed dose and frequency on 03/17/2024.   Recommend concurrent antiplatelet therapy of Aspirin  81 mg for 1 month and Clopidogrel  75mg  daily for 12 months .   Single vessel obstructive CAD involving the mid RCA Mildly elevated LVEDP 17 mm Hg Successful PCI of the mid RCA with DES   Plan: may resume Eliquis  this evening. ASA for one month. Plavix  for 12 months.   2d echo 03/16/2024 1. Left ventricular ejection fraction, by estimation, is 60 to 65%. The  left ventricle has normal function. The left ventricle has no regional  wall motion abnormalities. There is moderate concentric left ventricular  hypertrophy. Left ventricular  diastolic parameters are consistent with Grade I diastolic dysfunction  (impaired relaxation).   2. Right ventricular systolic  function is normal. The right ventricular  size is moderately enlarged.   3. The mitral valve is normal in structure. No evidence of mitral valve  regurgitation. No evidence of mitral stenosis.   4. The aortic valve is tricuspid. There is mild calcification of the  aortic valve. Aortic valve regurgitation is not visualized. Aortic valve  sclerosis is present, with no evidence of aortic valve stenosis.   5. The inferior vena cava is normal in size with <50% respiratory  variability, suggesting right atrial pressure of 8 mmHg.   2d echo 12/25/2023 1. Left ventricular ejection fraction, by estimation, is 60 to 65%. The  left ventricle has normal function. The left ventricle has no regional  wall motion abnormalities. There is mild left ventricular hypertrophy.  Left ventricular diastolic parameters  were normal.   2. Right ventricular systolic function is normal. The right ventricular  size is normal.   3. The mitral valve is normal in structure. Mild mitral valve  regurgitation. No evidence of mitral stenosis.   4. The aortic valve  is tricuspid. There is mild calcification of the  aortic valve. Aortic valve regurgitation is not visualized. Aortic valve  sclerosis is present, with no evidence of aortic valve stenosis.   5. The inferior vena cava is normal in size with greater than 50%  respiratory variability, suggesting right atrial pressure of 3 mmHg.   Risk Assessment/Calculations  CHA2DS2-VASc Score = 4  {Confirm score is correct.  If not, click here to update score.  REFRESH note.  :1} This indicates a 4.8% annual risk of stroke. The patient's score is based upon: CHF History: 0 HTN History: 1 Diabetes History: 1 Stroke History: 2 Vascular Disease History: 0 Age Score: 0 Gender Score: 0   {This patient has a significant risk of stroke if diagnosed with atrial fibrillation.  Please consider VKA or DOAC agent for anticoagulation if the bleeding risk is acceptable.   You can  also use the SmartPhrase .[email or web address] for documentation.   :[id number]} No BP recorded.  {Refresh Note OR Click here to enter BP  :1}***       Physical Exam VS:  There were no vitals taken for this visit.       Wt Readings from Last 3 Encounters:  04/30/24 267 lb (121.1 kg)  03/18/24 245 lb 13 oz (111.5 kg)  02/23/24 271 lb (122.9 kg)    GEN: Well nourished, well developed in no acute distress NECK: No JVD; No carotid bruits CARDIAC: ***RRR, no murmurs, rubs, gallops RESPIRATORY:  Clear to auscultation without rales, wheezing or rhonchi  ABDOMEN: Soft, non-tender, non-distended EXTREMITIES:  No edema; No deformity   ASSESSMENT AND PLAN Coronary artery disease status post PCI to the RCA in the setting of recent NSTEMI during hospitalization Hypertension Hyperlipidemia Paroxysmal atrial fibrillation/atrial flutter End-stage renal disease {The patient has an active order for outpatient cardiac rehabilitation.   Please indicate if the patient is ready to start. Do NOT delete this.  It will auto delete.  Refresh note, then sign.              Click here to document readiness and see contraindications.  :1}  Cardiac Rehabilitation Eligibility Assessment      {Are you ordering a CV Procedure (e.g. stress test, cath, DCCV, TEE, etc)?   Press F2        :[id number]}  Dispo: ***  Signed, Zair Borawski, NP

## 2024-05-05 NOTE — Telephone Encounter (Signed)
 Copied from CRM #[id number]. Topic: General - Call Back - No Documentation >> May 05, 2024 11:15 AM Chiquita SQUIBB wrote: Reason for CRM: Patient is calling Waldine Zenz back, please contact the patient back.

## 2024-05-05 NOTE — Telephone Encounter (Signed)
 Lvm. Okay to [employer] message sent

## 2024-05-05 NOTE — Therapy (Incomplete)
 OUTPATIENT PHYSICAL THERAPY NEURO TREATMENT   Patient Name: Gregory Crane MRN: [id number] DOB:09/26/1970, 53 y.o., male Today's Date: 05/05/2024   PCP: Glendia Shad  REFERRING PROVIDER: Glendia Shad  END OF SESSION:     Past Medical History:  Diagnosis Date   Allergy    Anemia    Bell's palsy    Diabetes mellitus without complication (HCC)    diet controlled   Fall 07/2023   Hypertension    Hypothyroidism    Kidney stones    Pseudotumor cerebri    Stroke [facility])    Past Surgical History:  Procedure Laterality Date   COLONOSCOPY WITH PROPOFOL  N/A 03/30/2020   Procedure: COLONOSCOPY WITH PROPOFOL ;  Surgeon: Maryruth Ole DASEN, MD;  Location: [facility] ENDOSCOPY;  Service: Endoscopy;  Laterality: N/A;   CORONARY STENT INTERVENTION N/A 03/17/2024   Procedure: CORONARY STENT INTERVENTION;  Surgeon: Swaziland, Peter M, MD;  Location: [facility] INVASIVE CV LAB;  Service: Cardiovascular;  Laterality: N/A;   LEFT HEART CATH AND CORONARY ANGIOGRAPHY N/A 03/17/2024   Procedure: LEFT HEART CATH AND CORONARY ANGIOGRAPHY;  Surgeon: Swaziland, Peter M, MD;  Location: [facility] INVASIVE CV LAB;  Service: Cardiovascular;  Laterality: N/A;   LOOP RECORDER INSERTION N/A 01/27/2018   Procedure: LOOP RECORDER INSERTION;  Surgeon: Fernande Elspeth BROCKS, MD;  Location: [facility] INVASIVE CV LAB;  Service: Cardiovascular;  Laterality: N/A;   LUMBAR PUNCTURE     as child   NO PAST SURGERIES     REMOVAL OF A DIALYSIS CATHETER     TEE WITHOUT CARDIOVERSION N/A 01/07/2018   Procedure: TRANSESOPHAGEAL ECHOCARDIOGRAM (TEE);  Surgeon: Perla Evalene PARAS, MD;  Location: [facility] ORS;  Service: Cardiovascular;  Laterality: N/A;   Patient Active Problem List   Diagnosis Date Noted   Bradycardia 04/30/2024   CAD S/P percutaneous coronary angioplasty 03/17/2024   Non-ST elevation (NSTEMI) myocardial infarction (HCC) 03/16/2024   Hypervolemia 03/14/2024   Hypoxic respiratory failure (HCC) 03/14/2024   Demand ischemia (HCC) 03/14/2024    Left hip pain 01/30/2024   Acute on chronic heart failure with preserved ejection fraction (HFpEF) (HCC) 12/26/2023   Community acquired pneumonia 12/24/2023   Acute respiratory failure with hypoxia (HCC) 12/23/2023   SVT (supraventricular tachycardia) (HCC) 08/10/2023   Atrial fibrillation (HCC) 07/03/2023   Atrial fibrillation with RVR (HCC) 07/02/2023   Fall 06/15/2023   Non-compliance with renal dialysis (HCC) 05/02/2023   Obesity (BMI 30-39.9) 04/30/2023   Volume overload 04/29/2023   Renal osteodystrophy 11/25/2022   Unsteady gait 10/05/2022   Steal syndrome of dialysis vascular access (HCC) 05/28/2022   Hydronephrosis, left 05/04/2022   Leukocytosis 05/04/2022   Chest pain 01/13/2022   Diabetic retinopathy associated with diabetes mellitus due to underlying condition (HCC) 01/13/2022   ESRD on hemodialysis (HCC) 01/13/2022   Deafness in right ear 08/11/2021   Open wound 01/25/2021   History of colon polyps 10/15/2020   Acute cholecystitis without calculus 09/09/2020   Type 2 diabetes mellitus, with long-term current use of insulin  (HCC) 09/09/2020   Cryptogenic stroke (HCC) 07/13/2020   History of loop recorder 07/13/2020   Elevated troponin 04/03/2020   Acquired trigger finger 06/08/2019   Lymphedema 06/08/2019   Anemia 04/17/2019   Swelling of both lower extremities 01/10/2019   Facial droop 04/09/2018   Daytime somnolence 03/30/2018   Carotid artery disease (HCC) 02/03/2018   Intracranial vascular stenosis 09/12/2017   Cough 01/20/2017   Bell's palsy 11/10/2016   History of CVA (cerebrovascular accident) 11/10/2016   Benign localized hyperplasia of prostate  with urinary obstruction 10/27/2016   History of nephrolithiasis 10/27/2016   TIA (transient ischemic attack) 10/20/2016   Near syncope 06/23/2016   Organic impotence 10/01/2015   Neuropathy 08/06/2015   Health care maintenance 08/06/2015   Hypertension 08/06/2015   Hypothyroidism 10/10/2013    Microalbuminuria 10/10/2013   Hyperlipidemia 10/10/2013   B12 deficiency 10/10/2013   Environmental allergies 07/04/2013    ONSET DATE: 3 years ago  REFERRING DIAG: unsteady gait  THERAPY DIAG:  No diagnosis found.  Rationale for Evaluation and Treatment: Rehabilitation  SUBJECTIVE:                                                                                                                                                                                             SUBJECTIVE STATEMENT:   ***  Pt accompanied by: self  PERTINENT HISTORY: Patient returning to PT s/p hospitalization for pneumonia, , SVT, ESRD on hypodialysis. PMH DM, HTN, Anemia, Bells palsy, Hypothyroidism, Kidney stones, stroke and pseudotumour cerebri. Macular degeneration with impaired visual acuity   PAIN:  Are you having pain? Some L foot numbness/neuropathy  PRECAUTIONS: Fall  RED FLAGS: None   WEIGHT BEARING RESTRICTIONS: No  FALLS: Has patient fallen in last 6 months? No  LIVING ENVIRONMENT: Lives with: lives alone Lives in: House/apartment Stairs: yes stairs in house Has following equipment at home: Single point cane  PLOF: Independent with basic ADLs  PATIENT GOALS: balance, strength  OBJECTIVE:  Note: Objective measures were completed at Evaluation unless otherwise noted.  DIAGNOSTIC FINDINGS: IMPRESSION: Interval resolution of previously noted trace left parafalcine subdural hematoma. No acute intracranial process.  COGNITION: Overall cognitive status: Within functional limits for tasks assessed   SENSATION: WFL  COORDINATION: Heel slide: WFL RLE; slight decrease with LLE   MUSCLE TONE: WFL  POSTURE: rounded shoulders, forward head, and posterior pelvic tilt  LOWER EXTREMITY ROM:     Active  Right Eval Left Eval  Hip flexion    Hip extension    Hip abduction    Hip adduction    Hip internal rotation    Hip external rotation    Knee flexion    Knee extension     Ankle dorsiflexion  -3  Ankle plantarflexion  35  Ankle inversion  10  Ankle eversion  8   (Blank rows = not tested)  LOWER EXTREMITY MMT:    MMT Right Eval Left Eval  Hip flexion 4 4  Hip extension    Hip abduction 3 3  Hip adduction 3+ 3+  Hip internal rotation    Hip external rotation    Knee flexion 4- 4-  Knee extension 4 4  Ankle dorsiflexion 3 2+  Ankle plantarflexion 3+ 3  Ankle inversion    Ankle eversion    (Blank rows = not tested)  BED MOBILITY:  Not tested  TRANSFERS: Sit to stand: CGA  Assistive device utilized: None     Stand to sit: CGA  Assistive device utilized: None     Chair to chair: CGA  Assistive device utilized: None       RAMP:  Not tested  CURB:  Findings: needs UE support to step up  STAIRS: heavy BUE support step over step negotiation.  GAIT: Findings: Gait Characteristics: decreased stride length, Left foot flat, wide BOS, and poor foot clearance- Left, Distance walked: 60 ft, Assistive device utilized:None, Level of assistance: CGA, and Comments: increased L ankle rolling due to not wearing AFO  FUNCTIONAL TESTS:  5 times sit to stand: 30.53 with 4 posterior LOB 6 minute walk test: perform next session  Berg Balance Scale: 26  PATIENT SURVEYS:  LEFS give next session                                                                                                                               TREATMENT DATE: 05/05/24 *** TherAct:   Sit to stand with UE on LE's, due to imbalance and weakness in the LE's, 2x10  Pt with instability during the final attempts when performing the first set  Briefcase carries with 15# KB's, x2 laps each UE holding  Aura carries with 15# KB's, x2 laps each UE holding    Neuro: ?   Patient participated in balance training using the [employer] on the KoreBalanceT platform for 24 minutes at stability level 6.0. The task involved navigating a digital ball across a screen and shifting the  center of gravity on a dynamic platform in order to break bricks. This activity targets postural control, weight shifting, proprioception, and visual-motor coordination, and promotes core stability and reactive balance strategies. The patient required min level of assistance and minimal verbal cueing.       PATIENT EDUCATION: Education details: goals, POC, HEP Person educated: Patient Education method: Explanation, Demonstration, Tactile cues, Verbal cues, and Handouts Education comprehension: verbalized understanding, returned demonstration, verbal cues required, tactile cues required, and needs further education  HOME EXERCISE PROGRAM: Access Code: [email or web address] URL: [email or web address] Date: 12/29/2023 Prepared by: Marina  Moser  Exercises - Leg Extension  - 1 x daily - 7 x weekly - 2 sets - 10 reps - 5 hold - Seated March  - 1 x daily - 7 x weekly - 2 sets - 10 reps - 5 hold - Seated Heel Raise  - 1 x daily - 7 x weekly - 2 sets - 10 reps - 5 hold  GOALS: Goals reviewed with patient? Yes  SHORT TERM GOALS: Target date: 01/26/2024    Patient will be independent in home exercise program to improve strength/mobility for better functional independence with  ADLs.  Baseline: Goal status: INITIAL  LONG TERM GOALS: Target date: 03/22/2024    Patient (< 45 years old) will complete five times sit to stand test in < 10 seconds indicating an increased LE strength and improved balance.  Baseline: 30.53 with 4 posterior LOB 03/29/24: 22.61 with 3 posterior LOB Goal status: PROGRESSING  2.   Patient will increase Berg Balance score by >45/56 to demonstrate decreased fall risk during functional activities.  Baseline: 5/5: 26  03/29/24: 32 Goal status: INITIAL  3.    Patient will increase six minute walk test distance to >1200 for progression to age norm community ambulator and improve gait ability  Baseline: perform next session 5/12: 710 ft  03/29/24: 770' Goal status:  PROGRESSING  4.  Patient will score > 60/80 on LEFS for improved functional mobility and quality of life.  Baseline: perform next session 5/12: 43 03/29/24: 39/80 Goal status: INITIAL    ASSESSMENT:  CLINICAL IMPRESSION:  *** Pt performed well with the tasks given today and enjoyed participating in the [facility] exercises.  Pt does have visual deficits that limited him initially, however pt was able to track the ball better the longer he participated in the exercise.  Pt will continue to benefit from challenging overall balance levels and gait mechanics as therapy progresses.   Pt will continue to benefit from skilled therapy to address remaining deficits in order to improve overall QoL and return to PLOF.       OBJECTIVE IMPAIRMENTS: Abnormal gait, cardiopulmonary status limiting activity, decreased activity tolerance, decreased balance, decreased coordination, decreased endurance, decreased knowledge of use of DME, decreased mobility, difficulty walking, decreased ROM, decreased strength, dizziness, impaired perceived functional ability, impaired flexibility, impaired vision/preception, improper body mechanics, postural dysfunction, and obesity.   ACTIVITY LIMITATIONS: carrying, lifting, bending, sitting, standing, squatting, stairs, transfers, bed mobility, toileting, dressing, reach over head, hygiene/grooming, locomotion level, and caring for others  PARTICIPATION LIMITATIONS: meal prep, cleaning, laundry, driving, shopping, community activity, occupation, and yard work  PERSONAL FACTORS: Age, Behavior pattern, Fitness, Past/current experiences, Time since onset of injury/illness/exacerbation, Transportation, and 3+ comorbidities: DM, HTN, Anemia, Bells palsy, Hypothyroidism, Kidney stones, stroke and pseudotumour cerebri. Macular degeneration with impaired visual acuity  are also affecting patient's functional outcome.   REHAB POTENTIAL: Good  CLINICAL DECISION MAKING:  Evolving/moderate complexity  EVALUATION COMPLEXITY: Moderate  PLAN:  PT FREQUENCY: 2x/week  PT DURATION: 12 weeks  PLANNED INTERVENTIONS: 97164- PT Re-evaluation, 97750- Physical Performance Testing, 97110-Therapeutic exercises, 97530- Therapeutic activity, [id number]- Neuromuscular re-education, 97535- Self Care, 02859- Manual therapy, [id number]- Gait training, [phone]- Orthotic Initial, [phone]- Orthotic/Prosthetic subsequent, [phone]- Canalith repositioning, [id number]- Splinting, H9716- Electrical stimulation (unattended), [phone]- Electrical stimulation (manual), [id number]- Vasopneumatic device, [id number]- Ultrasound, [id number]- Traction (mechanical), [id number]- Ionotophoresis 4mg /ml Dexamethasone , Patient/Family education, Balance training, Stair training, Taping, Dry Needling, Joint mobilization, Spinal mobilization, Scar mobilization, Compression bandaging, Vestibular training, Visual/preceptual remediation/compensation, Cognitive remediation, DME instructions, Cryotherapy, Moist heat, and Biofeedback  PLAN FOR NEXT SESSION: *** Korebalance balance, strength, L ankle strength, stairs, LE endurance   Fonda Simpers, PT, DPT Physical Therapist -   [facility]  05/05/24, 7:57 AM

## 2024-05-07 NOTE — Telephone Encounter (Signed)
 Copied from CRM #[id number]. Topic: General - Other >> May 07, 2024 11:59 AM Macario HERO wrote: Reason for CRM: Patient returning call from office. Advised he can send a MyChart message to let his provider know how he's doing (per CAL)  -- Patient said he is doing well and no one has mentioned his heart rate.

## 2024-05-07 NOTE — Telephone Encounter (Signed)
 Noted.  Need labs and office notes and per discussion see if nurses are still having issues with low pulse rate during dialysis.

## 2024-05-10 ENCOUNTER — Ambulatory Visit

## 2024-05-10 DIAGNOSIS — R262 Difficulty in walking, not elsewhere classified: Secondary | ICD-10-CM

## 2024-05-10 DIAGNOSIS — R269 Unspecified abnormalities of gait and mobility: Secondary | ICD-10-CM

## 2024-05-10 DIAGNOSIS — R2681 Unsteadiness on feet: Secondary | ICD-10-CM

## 2024-05-10 DIAGNOSIS — M6281 Muscle weakness (generalized): Secondary | ICD-10-CM | POA: Diagnosis not present

## 2024-05-10 DIAGNOSIS — R2689 Other abnormalities of gait and mobility: Secondary | ICD-10-CM

## 2024-05-10 DIAGNOSIS — R278 Other lack of coordination: Secondary | ICD-10-CM

## 2024-05-10 NOTE — Therapy (Signed)
 OUTPATIENT PHYSICAL THERAPY NEURO TREATMENT   Patient Name: Bowman Higbie MRN: [id number] DOB:Jan 12, 1971, 53 y.o., male Today's Date: 05/10/2024   PCP: Glendia Shad  REFERRING PROVIDER: Glendia Shad  END OF SESSION:  PT End of Session - 05/10/24 1020     Visit Number 22    Number of Visits 39    Date for PT Re-Evaluation 06/21/24    Progress Note Due on Visit 20    PT Start Time 1020    PT Stop Time 1100    PT Time Calculation (min) 40 min    Equipment Utilized During Treatment Gait belt    Activity Tolerance Patient tolerated treatment well    Behavior During Therapy [facility] for tasks assessed/performed            Past Medical History:  Diagnosis Date   Allergy    Anemia    Bell's palsy    Diabetes mellitus without complication (HCC)    diet controlled   Fall 07/2023   Hypertension    Hypothyroidism    Kidney stones    Pseudotumor cerebri    Stroke [facility])    Past Surgical History:  Procedure Laterality Date   COLONOSCOPY WITH PROPOFOL  N/A 03/30/2020   Procedure: COLONOSCOPY WITH PROPOFOL ;  Surgeon: Maryruth Ole DASEN, MD;  Location: [facility] ENDOSCOPY;  Service: Endoscopy;  Laterality: N/A;   CORONARY STENT INTERVENTION N/A 03/17/2024   Procedure: CORONARY STENT INTERVENTION;  Surgeon: Swaziland, Peter M, MD;  Location: [facility] INVASIVE CV LAB;  Service: Cardiovascular;  Laterality: N/A;   LEFT HEART CATH AND CORONARY ANGIOGRAPHY N/A 03/17/2024   Procedure: LEFT HEART CATH AND CORONARY ANGIOGRAPHY;  Surgeon: Swaziland, Peter M, MD;  Location: [facility] INVASIVE CV LAB;  Service: Cardiovascular;  Laterality: N/A;   LOOP RECORDER INSERTION N/A 01/27/2018   Procedure: LOOP RECORDER INSERTION;  Surgeon: Fernande Elspeth BROCKS, MD;  Location: [facility] INVASIVE CV LAB;  Service: Cardiovascular;  Laterality: N/A;   LUMBAR PUNCTURE     as child   NO PAST SURGERIES     REMOVAL OF A DIALYSIS CATHETER     TEE WITHOUT CARDIOVERSION N/A 01/07/2018   Procedure: TRANSESOPHAGEAL ECHOCARDIOGRAM (TEE);   Surgeon: Perla Evalene PARAS, MD;  Location: [facility] ORS;  Service: Cardiovascular;  Laterality: N/A;   Patient Active Problem List   Diagnosis Date Noted   Bradycardia 04/30/2024   CAD S/P percutaneous coronary angioplasty 03/17/2024   Non-ST elevation (NSTEMI) myocardial infarction (HCC) 03/16/2024   Hypervolemia 03/14/2024   Hypoxic respiratory failure (HCC) 03/14/2024   Demand ischemia (HCC) 03/14/2024   Left hip pain 01/30/2024   Acute on chronic heart failure with preserved ejection fraction (HFpEF) (HCC) 12/26/2023   Community acquired pneumonia 12/24/2023   Acute respiratory failure with hypoxia (HCC) 12/23/2023   SVT (supraventricular tachycardia) (HCC) 08/10/2023   Atrial fibrillation (HCC) 07/03/2023   Atrial fibrillation with RVR (HCC) 07/02/2023   Fall 06/15/2023   Non-compliance with renal dialysis (HCC) 05/02/2023   Obesity (BMI 30-39.9) 04/30/2023   Volume overload 04/29/2023   Renal osteodystrophy 11/25/2022   Unsteady gait 10/05/2022   Steal syndrome of dialysis vascular access (HCC) 05/28/2022   Hydronephrosis, left 05/04/2022   Leukocytosis 05/04/2022   Chest pain 01/13/2022   Diabetic retinopathy associated with diabetes mellitus due to underlying condition (HCC) 01/13/2022   ESRD on hemodialysis (HCC) 01/13/2022   Deafness in right ear 08/11/2021   Open wound 01/25/2021   History of colon polyps 10/15/2020   Acute cholecystitis without calculus 09/09/2020   Type 2  diabetes mellitus, with long-term current use of insulin  (HCC) 09/09/2020   Cryptogenic stroke (HCC) 07/13/2020   History of loop recorder 07/13/2020   Elevated troponin 04/03/2020   Acquired trigger finger 06/08/2019   Lymphedema 06/08/2019   Anemia 04/17/2019   Swelling of both lower extremities 01/10/2019   Facial droop 04/09/2018   Daytime somnolence 03/30/2018   Carotid artery disease (HCC) 02/03/2018   Intracranial vascular stenosis 09/12/2017   Cough 01/20/2017   Bell's palsy 11/10/2016    History of CVA (cerebrovascular accident) 11/10/2016   Benign localized hyperplasia of prostate with urinary obstruction 10/27/2016   History of nephrolithiasis 10/27/2016   TIA (transient ischemic attack) 10/20/2016   Near syncope 06/23/2016   Organic impotence 10/01/2015   Neuropathy 08/06/2015   Health care maintenance 08/06/2015   Hypertension 08/06/2015   Hypothyroidism 10/10/2013   Microalbuminuria 10/10/2013   Hyperlipidemia 10/10/2013   B12 deficiency 10/10/2013   Environmental allergies 07/04/2013    ONSET DATE: 3 years ago  REFERRING DIAG: unsteady gait  THERAPY DIAG:  Muscle weakness (generalized)  Unsteadiness on feet  Other abnormalities of gait and mobility  Difficulty in walking, not elsewhere classified  Abnormality of gait and mobility  Other lack of coordination  Rationale for Evaluation and Treatment: Rehabilitation  SUBJECTIVE:                                                                                                                                                                                             SUBJECTIVE STATEMENT:   Pt reports he had a good weekend, no falls.  Pt reports his neuropathy has been acting up this morning.    Pt accompanied by: self  PERTINENT HISTORY: Patient returning to PT s/p hospitalization for pneumonia, , SVT, ESRD on hypodialysis. PMH DM, HTN, Anemia, Bells palsy, Hypothyroidism, Kidney stones, stroke and pseudotumour cerebri. Macular degeneration with impaired visual acuity   PAIN:  Are you having pain? Some L foot numbness/neuropathy  PRECAUTIONS: Fall  RED FLAGS: None   WEIGHT BEARING RESTRICTIONS: No  FALLS: Has patient fallen in last 6 months? No  LIVING ENVIRONMENT: Lives with: lives alone Lives in: House/apartment Stairs: yes stairs in house Has following equipment at home: Single point cane  PLOF: Independent with basic ADLs  PATIENT GOALS: balance, strength  OBJECTIVE:  Note:  Objective measures were completed at Evaluation unless otherwise noted.  DIAGNOSTIC FINDINGS: IMPRESSION: Interval resolution of previously noted trace left parafalcine subdural hematoma. No acute intracranial process.  COGNITION: Overall cognitive status: Within functional limits for tasks assessed   SENSATION: WFL  COORDINATION: Heel slide: WFL RLE; slight decrease with LLE  MUSCLE TONE: WFL  POSTURE: rounded shoulders, forward head, and posterior pelvic tilt  LOWER EXTREMITY ROM:     Active  Right Eval Left Eval  Hip flexion    Hip extension    Hip abduction    Hip adduction    Hip internal rotation    Hip external rotation    Knee flexion    Knee extension    Ankle dorsiflexion  -3  Ankle plantarflexion  35  Ankle inversion  10  Ankle eversion  8   (Blank rows = not tested)  LOWER EXTREMITY MMT:    MMT Right Eval Left Eval  Hip flexion 4 4  Hip extension    Hip abduction 3 3  Hip adduction 3+ 3+  Hip internal rotation    Hip external rotation    Knee flexion 4- 4-  Knee extension 4 4  Ankle dorsiflexion 3 2+  Ankle plantarflexion 3+ 3  Ankle inversion    Ankle eversion    (Blank rows = not tested)  BED MOBILITY:  Not tested  TRANSFERS: Sit to stand: CGA  Assistive device utilized: None     Stand to sit: CGA  Assistive device utilized: None     Chair to chair: CGA  Assistive device utilized: None       RAMP:  Not tested  CURB:  Findings: needs UE support to step up  STAIRS: heavy BUE support step over step negotiation.  GAIT: Findings: Gait Characteristics: decreased stride length, Left foot flat, wide BOS, and poor foot clearance- Left, Distance walked: 60 ft, Assistive device utilized:None, Level of assistance: CGA, and Comments: increased L ankle rolling due to not wearing AFO  FUNCTIONAL TESTS:  5 times sit to stand: 30.53 with 4 posterior LOB 6 minute walk test: perform next session  Berg Balance Scale: 26  PATIENT SURVEYS:   LEFS give next session                                                                                                                               TREATMENT DATE: 05/10/24  TherAct:   The patient completed 3 minutes at level(s) 5 on the NuStep using BLE only with reciprocal movements to promote strength, endurance, and cardiorespiratory fitness. PT increased the resistance level and monitored the patient's response to the intervention throughout. The patient required min cueing for technique and supervision level of assistance.  Pt instructed on keeping SPM ~60  Sit to stand while holding 15# KB, x8, pt fatigued and unable to perform anymore and needed a rest break following  Briefcase carries with 15# KB's, x2 laps each UE holding  Aura carries with 15# KB's, x2 laps each UE holding  Lateral step ups onto 6 step with UE support on handrai, x10 each direction   PATIENT EDUCATION: Education details: goals, POC, HEP Person educated: Patient Education method: Explanation, Demonstration, Tactile cues, Verbal cues, and Handouts Education comprehension: verbalized understanding, returned demonstration,  verbal cues required, tactile cues required, and needs further education  HOME EXERCISE PROGRAM: Access Code: [email or web address] URL: [email or web address] Date: 12/29/2023 Prepared by: Marina  Moser  Exercises - Leg Extension  - 1 x daily - 7 x weekly - 2 sets - 10 reps - 5 hold - Seated March  - 1 x daily - 7 x weekly - 2 sets - 10 reps - 5 hold - Seated Heel Raise  - 1 x daily - 7 x weekly - 2 sets - 10 reps - 5 hold  GOALS: Goals reviewed with patient? Yes  SHORT TERM GOALS: Target date: 01/26/2024  Patient will be independent in home exercise program to improve strength/mobility for better functional independence with ADLs.  Baseline: Goal status: INITIAL  LONG TERM GOALS: Target date: 03/22/2024  Patient (< 71 years old) will complete five times sit to stand test in  < 10 seconds indicating an increased LE strength and improved balance.  Baseline: 30.53 with 4 posterior LOB 03/29/24: 22.61 with 3 posterior LOB Goal status: PROGRESSING  2.   Patient will increase Berg Balance score by >45/56 to demonstrate decreased fall risk during functional activities.  Baseline: 5/5: 26  03/29/24: 32 Goal status: INITIAL  3.    Patient will increase six minute walk test distance to >1200 for progression to age norm community ambulator and improve gait ability  Baseline: perform next session 5/12: 710 ft  03/29/24: 770' Goal status: PROGRESSING  4.  Patient will score > 60/80 on LEFS for improved functional mobility and quality of life.  Baseline: perform next session 5/12: 43 03/29/24: 39/80 Goal status: INITIAL    ASSESSMENT:  CLINICAL IMPRESSION:   Pt very fatigued at the conclusion of the session today.  Pt was most fatigued by the NuStep and may be a point of emphasis in the future to encourage endurance levels and increased resistance as therapy progresses.  Pt also with some increased difficulty with the eccentric lowering from the lateral side steps on the stairs.  Pt will continue to improve overall tolerance to exercises and improve endurance levels.   Pt will continue to benefit from skilled therapy to address remaining deficits in order to improve overall QoL and return to PLOF.      OBJECTIVE IMPAIRMENTS: Abnormal gait, cardiopulmonary status limiting activity, decreased activity tolerance, decreased balance, decreased coordination, decreased endurance, decreased knowledge of use of DME, decreased mobility, difficulty walking, decreased ROM, decreased strength, dizziness, impaired perceived functional ability, impaired flexibility, impaired vision/preception, improper body mechanics, postural dysfunction, and obesity.   ACTIVITY LIMITATIONS: carrying, lifting, bending, sitting, standing, squatting, stairs, transfers, bed mobility, toileting, dressing, reach  over head, hygiene/grooming, locomotion level, and caring for others  PARTICIPATION LIMITATIONS: meal prep, cleaning, laundry, driving, shopping, community activity, occupation, and yard work  PERSONAL FACTORS: Age, Behavior pattern, Fitness, Past/current experiences, Time since onset of injury/illness/exacerbation, Transportation, and 3+ comorbidities: DM, HTN, Anemia, Bells palsy, Hypothyroidism, Kidney stones, stroke and pseudotumour cerebri. Macular degeneration with impaired visual acuity  are also affecting patient's functional outcome.   REHAB POTENTIAL: Good  CLINICAL DECISION MAKING: Evolving/moderate complexity  EVALUATION COMPLEXITY: Moderate  PLAN:  PT FREQUENCY: 2x/week  PT DURATION: 12 weeks  PLANNED INTERVENTIONS: 97164- PT Re-evaluation, 97750- Physical Performance Testing, 97110-Therapeutic exercises, 97530- Therapeutic activity, [id number]- Neuromuscular re-education, 97535- Self Care, [id number]- Manual therapy, [id number]- Gait training, [id number]- Orthotic Initial, [id number]- Orthotic/Prosthetic subsequent, [phone]- Canalith repositioning, [id number]- Splinting, H9716- Electrical stimulation (unattended), [id number]- Electrical stimulation (manual), [id number]- Vasopneumatic device, [id number]- Ultrasound, [id number]- Traction (mechanical), [id number]-  Ionotophoresis 4mg /ml Dexamethasone , Patient/Family education, Balance training, Stair training, Taping, Dry Needling, Joint mobilization, Spinal mobilization, Scar mobilization, Compression bandaging, Vestibular training, Visual/preceptual remediation/compensation, Cognitive remediation, DME instructions, Cryotherapy, Moist heat, and Biofeedback  PLAN FOR NEXT SESSION:  Korebalance balance, strength, L ankle strength, stairs, LE endurance   Fonda Simpers, PT, DPT Physical Therapist - [place]  [facility]  05/10/24, 10:21 AM

## 2024-05-12 ENCOUNTER — Ambulatory Visit

## 2024-05-12 NOTE — Therapy (Incomplete)
 OUTPATIENT PHYSICAL THERAPY NEURO TREATMENT   Patient Name: Gregory Crane MRN: [id number] DOB:[birth year], 53 y.o., male Today's Date: 05/12/2024   PCP: Glendia Shad  REFERRING PROVIDER: Glendia Shad  END OF SESSION:      Past Medical History:  Diagnosis Date   Allergy    Anemia    Bell's palsy    Diabetes mellitus without complication (HCC)    diet controlled   Fall 07/2023   Hypertension    Hypothyroidism    Kidney stones    Pseudotumor cerebri    Stroke [facility])    Past Surgical History:  Procedure Laterality Date   COLONOSCOPY WITH PROPOFOL  N/A 03/30/2020   Procedure: COLONOSCOPY WITH PROPOFOL ;  Surgeon: Maryruth Ole DASEN, MD;  Location: [facility] ENDOSCOPY;  Service: Endoscopy;  Laterality: N/A;   CORONARY STENT INTERVENTION N/A 03/17/2024   Procedure: CORONARY STENT INTERVENTION;  Surgeon: Swazil[place], Peter M, MD;  Location: [facility] INVASIVE CV LAB;  Service: Cardiovascular;  Laterality: N/A;   LEFT HEART CATH AND CORONARY ANGIOGRAPHY N/A 03/17/2024   Procedure: LEFT HEART CATH AND CORONARY ANGIOGRAPHY;  Surgeon: Swaziland, Peter M, MD;  Location: [facility] INVASIVE CV LAB;  Service: Cardiovascular;  Laterality: N/A;   LOOP RECORDER INSERTION N/A 01/27/2018   Procedure: LOOP RECORDER INSERTION;  Surgeon: Fernande Elspeth BROCKS, MD;  Location: [facility] INVASIVE CV LAB;  Service: Cardiovascular;  Laterality: N/A;   LUMBAR PUNCTURE     as child   NO PAST SURGERIES     REMOVAL OF A DIALYSIS CATHETER     TEE WITHOUT CARDIOVERSION N/A 01/07/2018   Procedure: TRANSESOPHAGEAL ECHOCARDIOGRAM (TEE);  Surgeon: Perla Evalene PARAS, MD;  Location: [facility] ORS;  Service: Cardiovascular;  Laterality: N/A;   Patient Active Problem List   Diagnosis Date Noted   Bradycardia 04/30/2024   CAD S/P percutaneous coronary angioplasty 03/17/2024   Non-ST elevation (NSTEMI) myocardial infarction (HCC) 03/16/2024   Hypervolemia 03/14/2024   Hypoxic respiratory failure (HCC) 03/14/2024   Demand ischemia (HCC)  03/14/2024   Left hip pain 01/30/2024   Acute on chronic heart failure with preserved ejection fraction (HFpEF) (HCC) 12/26/2023   Community acquired pneumonia 12/24/2023   Acute respiratory failure with hypoxia (HCC) 12/23/2023   SVT (supraventricular tachycardia) (HCC) 08/10/2023   Atrial fibrillation (HCC) 07/03/2023   Atrial fibrillation with RVR (HCC) 07/02/2023   Fall 06/15/2023   Non-compliance with renal dialysis (HCC) 05/02/2023   Obesity (BMI 30-39.9) 04/30/2023   Volume overload 04/29/2023   Renal osteodystrophy 11/25/2022   Unsteady gait 10/05/2022   Steal syndrome of dialysis vascular access (HCC) 05/28/2022   Hydronephrosis, left 05/04/2022   Leukocytosis 05/04/2022   Chest pain 01/13/2022   Diabetic retinopathy associated with diabetes mellitus due to underlying condition (HCC) 01/13/2022   ESRD on hemodialysis (HCC) 01/13/2022   Deafness in right ear 08/11/2021   Open wound 01/25/2021   History of colon polyps 10/15/2020   Acute cholecystitis without calculus 09/09/2020   Type 2 diabetes mellitus, with long-term current use of insulin  (HCC) 09/09/2020   Cryptogenic stroke (HCC) 07/13/2020   History of loop recorder 07/13/2020   Elevated troponin 04/03/2020   Acquired trigger finger 06/08/2019   Lymphedema 06/08/2019   Anemia 04/17/2019   Swelling of both lower extremities 01/10/2019   Facial droop 04/09/2018   Daytime somnolence 03/30/2018   Carotid artery disease (HCC) 02/03/2018   Intracranial vascular stenosis 09/12/2017   Cough 01/20/2017   Bell's palsy 11/10/2016   History of CVA (cerebrovascular accident) 11/10/2016   Benign localized hyperplasia of  prostate with urinary obstruction 10/27/2016   History of nephrolithiasis 10/27/2016   TIA (transient ischemic attack) 10/20/2016   Near syncope 06/23/2016   Organic impotence 10/01/2015   Neuropathy 08/06/2015   Health care maintenance 08/06/2015   Hypertension 08/06/2015   Hypothyroidism 10/10/2013    Microalbuminuria 10/10/2013   Hyperlipidemia 10/10/2013   B12 deficiency 10/10/2013   Environmental allergies 07/04/2013    ONSET DATE: 3 years ago  REFERRING DIAG: unsteady gait  THERAPY DIAG:  No diagnosis found.  Rationale for Evaluation and Treatment: Rehabilitation  SUBJECTIVE:                                                                                                                                                                                             SUBJECTIVE STATEMENT:   ***   Pt accompanied by: self  PERTINENT HISTORY: Patient returning to PT s/p hospitalization for pneumonia, , SVT, ESRD on hypodialysis. PMH DM, HTN, Anemia, Bells palsy, Hypothyroidism, Kidney stones, stroke and pseudotumour cerebri. Macular degeneration with impaired visual acuity   PAIN:  Are you having pain? Some L foot numbness/neuropathy  PRECAUTIONS: Fall  RED FLAGS: None   WEIGHT BEARING RESTRICTIONS: No  FALLS: Has patient fallen in last 6 months? No  LIVING ENVIRONMENT: Lives with: lives alone Lives in: House/apartment Stairs: yes stairs in house Has following equipment at home: Single point cane  PLOF: Independent with basic ADLs  PATIENT GOALS: balance, strength  OBJECTIVE:  Note: Objective measures were completed at Evaluation unless otherwise noted.  DIAGNOSTIC FINDINGS: IMPRESSION: Interval resolution of previously noted trace left parafalcine subdural hematoma. No acute intracranial process.  COGNITION: Overall cognitive status: Within functional limits for tasks assessed   SENSATION: WFL  COORDINATION: Heel slide: WFL RLE; slight decrease with LLE   MUSCLE TONE: WFL  POSTURE: rounded shoulders, forward head, and posterior pelvic tilt  LOWER EXTREMITY ROM:     Active  Right Eval Left Eval  Hip flexion    Hip extension    Hip abduction    Hip adduction    Hip internal rotation    Hip external rotation    Knee flexion    Knee extension     Ankle dorsiflexion  -3  Ankle plantarflexion  35  Ankle inversion  10  Ankle eversion  8   (Blank rows = not tested)  LOWER EXTREMITY MMT:    MMT Right Eval Left Eval  Hip flexion 4 4  Hip extension    Hip abduction 3 3  Hip adduction 3+ 3+  Hip internal rotation    Hip external rotation    Knee flexion 4-  4-  Knee extension 4 4  Ankle dorsiflexion 3 2+  Ankle plantarflexion 3+ 3  Ankle inversion    Ankle eversion    (Blank rows = not tested)  BED MOBILITY:  Not tested  TRANSFERS: Sit to stand: CGA  Assistive device utilized: None     Stand to sit: CGA  Assistive device utilized: None     Chair to chair: CGA  Assistive device utilized: None       RAMP:  Not tested  CURB:  Findings: needs UE support to step up  STAIRS: heavy BUE support step over step negotiation.  GAIT: Findings: Gait Characteristics: decreased stride length, Left foot flat, wide BOS, and poor foot clearance- Left, Distance walked: 60 ft, Assistive device utilized:None, Level of assistance: CGA, and Comments: increased L ankle rolling due to not wearing AFO  FUNCTIONAL TESTS:  5 times sit to stand: 30.53 with 4 posterior LOB 6 minute walk test: perform next session  Berg Balance Scale: 26  PATIENT SURVEYS:  LEFS give next session                                                                                                                               TREATMENT DATE: 05/12/24 *** TherAct:   The patient completed 3 minutes at level(s) 5 on the NuStep using BLE only with reciprocal movements to promote strength, endurance, and cardiorespiratory fitness. PT increased the resistance level and monitored the patient's response to the intervention throughout. The patient required min cueing for technique and supervision level of assistance.  Pt instructed on keeping SPM ~60  Sit to stand while holding 15# KB, x8, pt fatigued and unable to perform anymore and needed a rest break  following  Briefcase carries with 15# KB's, x2 laps each UE holding  Aura carries with 15# KB's, x2 laps each UE holding  Lateral step ups onto 6 step with UE support on handrai, x10 each direction   PATIENT EDUCATION: Education details: goals, POC, HEP Person educated: Patient Education method: Explanation, Demonstration, Tactile cues, Verbal cues, and Handouts Education comprehension: verbalized understanding, returned demonstration, verbal cues required, tactile cues required, and needs further education  HOME EXERCISE PROGRAM: Access Code: [email or web address] URL: [email or web address] Date: 12/29/2023 Prepared by: Marina  Moser  Exercises - Leg Extension  - 1 x daily - 7 x weekly - 2 sets - 10 reps - 5 hold - Seated March  - 1 x daily - 7 x weekly - 2 sets - 10 reps - 5 hold - Seated Heel Raise  - 1 x daily - 7 x weekly - 2 sets - 10 reps - 5 hold  GOALS: Goals reviewed with patient? Yes  SHORT TERM GOALS: Target date: 01/26/2024  Patient will be independent in home exercise program to improve strength/mobility for better functional independence with ADLs.  Baseline: Goal status: INITIAL  LONG TERM GOALS: Target date: 03/22/2024  Patient (< 10 years old) will complete five  times sit to stand test in < 10 seconds indicating an increased LE strength and improved balance.  Baseline: 30.53 with 4 posterior LOB 03/29/24: 22.61 with 3 posterior LOB Goal status: PROGRESSING  2.   Patient will increase Berg Balance score by >45/56 to demonstrate decreased fall risk during functional activities.  Baseline: 5/5: 26  03/29/24: 32 Goal status: INITIAL  3.    Patient will increase six minute walk test distance to >1200 for progression to age norm community ambulator and improve gait ability  Baseline: perform next session 5/12: 710 ft  03/29/24: 770' Goal status: PROGRESSING  4.  Patient will score > 60/80 on LEFS for improved functional mobility and quality of life.   Baseline: perform next session 5/12: 43 03/29/24: 39/80 Goal status: INITIAL    ASSESSMENT:  CLINICAL IMPRESSION:   ***    OBJECTIVE IMPAIRMENTS: Abnormal gait, cardiopulmonary status limiting activity, decreased activity tolerance, decreased balance, decreased coordination, decreased endurance, decreased knowledge of use of DME, decreased mobility, difficulty walking, decreased ROM, decreased strength, dizziness, impaired perceived functional ability, impaired flexibility, impaired vision/preception, improper body mechanics, postural dysfunction, and obesity.   ACTIVITY LIMITATIONS: carrying, lifting, bending, sitting, standing, squatting, stairs, transfers, bed mobility, toileting, dressing, reach over head, hygiene/grooming, locomotion level, and caring for others  PARTICIPATION LIMITATIONS: meal prep, cleaning, laundry, driving, shopping, community activity, occupation, and yard work  PERSONAL FACTORS: Age, Behavior pattern, Fitness, Past/current experiences, Time since onset of injury/illness/exacerbation, Transportation, and 3+ comorbidities: DM, HTN, Anemia, Bells palsy, Hypothyroidism, Kidney stones, stroke and pseudotumour cerebri. Macular degeneration with impaired visual acuity  are also affecting patient's functional outcome.   REHAB POTENTIAL: Good  CLINICAL DECISION MAKING: Evolving/moderate complexity  EVALUATION COMPLEXITY: Moderate  PLAN:  PT FREQUENCY: 2x/week  PT DURATION: 12 weeks  PLANNED INTERVENTIONS: 97164- PT Re-evaluation, 97750- Physical Performance Testing, 97110-Therapeutic exercises, 97530- Therapeutic activity, [id number]- Neuromuscular re-education, 97535- Self Care, [id number]- Manual therapy, [id number]- Gait training, [phone]- Orthotic Initial, [phone]- Orthotic/Prosthetic subsequent, [phone]- Canalith repositioning, [id number]- Splinting, H9716- Electrical stimulation (unattended), [phone]- Electrical stimulation (manual), [id number]- Vasopneumatic device, [id number]- Ultrasound,  [id number]- Traction (mechanical), [id number]- Ionotophoresis 4mg /ml Dexamethasone , Patient/Family education, Balance training, Stair training, Taping, Dry Needling, Joint mobilization, Spinal mobilization, Scar mobilization, Compression bandaging, Vestibular training, Visual/preceptual remediation/compensation, Cognitive remediation, DME instructions, Cryotherapy, Moist heat, and Biofeedback  PLAN FOR NEXT SESSION: *** Korebalance balance, strength, L ankle strength, stairs, LE endurance   Fonda Simpers, PT, DPT Physical Therapist - [place]  [facility]  05/12/24, 7:54 AM

## 2024-05-13 NOTE — Telephone Encounter (Signed)
 Called and LM for dialysis center to return call

## 2024-05-17 ENCOUNTER — Ambulatory Visit

## 2024-05-17 DIAGNOSIS — R2681 Unsteadiness on feet: Secondary | ICD-10-CM

## 2024-05-17 DIAGNOSIS — R269 Unspecified abnormalities of gait and mobility: Secondary | ICD-10-CM

## 2024-05-17 DIAGNOSIS — R262 Difficulty in walking, not elsewhere classified: Secondary | ICD-10-CM

## 2024-05-17 DIAGNOSIS — M6281 Muscle weakness (generalized): Secondary | ICD-10-CM | POA: Diagnosis not present

## 2024-05-17 DIAGNOSIS — R2689 Other abnormalities of gait and mobility: Secondary | ICD-10-CM

## 2024-05-17 DIAGNOSIS — R278 Other lack of coordination: Secondary | ICD-10-CM

## 2024-05-17 NOTE — Therapy (Addendum)
 OUTPATIENT PHYSICAL THERAPY NEURO TREATMENT   Patient Name: Gregory Crane MRN: [id number] DOB:1971-01-28, 53 y.o., male Today's Date: 05/17/2024   PCP: Glendia Shad  REFERRING PROVIDER: Glendia Shad  END OF SESSION:  PT End of Session - 05/17/24 1018     Visit Number 23    Number of Visits 39    Date for Recertification  06/21/24    Progress Note Due on Visit 20    PT Start Time 1018    PT Stop Time 1100    PT Time Calculation (min) 42 min    Equipment Utilized During Treatment Gait belt    Activity Tolerance Patient tolerated treatment well    Behavior During Therapy WFL for tasks assessed/performed         Past Medical History:  Diagnosis Date   Allergy    Anemia    Bell's palsy    Diabetes mellitus without complication (HCC)    diet controlled   Fall 07/2023   Hypertension    Hypothyroidism    Kidney stones    Pseudotumor cerebri    Stroke [facility])    Past Surgical History:  Procedure Laterality Date   COLONOSCOPY WITH PROPOFOL  N/A 03/30/2020   Procedure: COLONOSCOPY WITH PROPOFOL ;  Surgeon: Maryruth Ole DASEN, MD;  Location: [facility] ENDOSCOPY;  Service: Endoscopy;  Laterality: N/A;   CORONARY STENT INTERVENTION N/A 03/17/2024   Procedure: CORONARY STENT INTERVENTION;  Surgeon: Swaziland, Peter M, MD;  Location: [facility] INVASIVE CV LAB;  Service: Cardiovascular;  Laterality: N/A;   LEFT HEART CATH AND CORONARY ANGIOGRAPHY N/A 03/17/2024   Procedure: LEFT HEART CATH AND CORONARY ANGIOGRAPHY;  Surgeon: Swaziland, Peter M, MD;  Location: [facility] INVASIVE CV LAB;  Service: Cardiovascular;  Laterality: N/A;   LOOP RECORDER INSERTION N/A 01/27/2018   Procedure: LOOP RECORDER INSERTION;  Surgeon: Fernande Elspeth BROCKS, MD;  Location: [facility] INVASIVE CV LAB;  Service: Cardiovascular;  Laterality: N/A;   LUMBAR PUNCTURE     as child   NO PAST SURGERIES     REMOVAL OF A DIALYSIS CATHETER     TEE WITHOUT CARDIOVERSION N/A 01/07/2018   Procedure: TRANSESOPHAGEAL ECHOCARDIOGRAM (TEE);   Surgeon: Perla Evalene PARAS, MD;  Location: [facility] ORS;  Service: Cardiovascular;  Laterality: N/A;   Patient Active Problem List   Diagnosis Date Noted   Bradycardia 04/30/2024   CAD S/P percutaneous coronary angioplasty 03/17/2024   Non-ST elevation (NSTEMI) myocardial infarction (HCC) 03/16/2024   Hypervolemia 03/14/2024   Hypoxic respiratory failure (HCC) 03/14/2024   Demand ischemia (HCC) 03/14/2024   Left hip pain 01/30/2024   Acute on chronic heart failure with preserved ejection fraction (HFpEF) (HCC) 12/26/2023   Community acquired pneumonia 12/24/2023   Acute respiratory failure with hypoxia (HCC) 12/23/2023   SVT (supraventricular tachycardia) (HCC) 08/10/2023   Atrial fibrillation (HCC) 07/03/2023   Atrial fibrillation with RVR (HCC) 07/02/2023   Fall 06/15/2023   Non-compliance with renal dialysis (HCC) 05/02/2023   Obesity (BMI 30-39.9) 04/30/2023   Volume overload 04/29/2023   Renal osteodystrophy 11/25/2022   Unsteady gait 10/05/2022   Steal syndrome of dialysis vascular access (HCC) 05/28/2022   Hydronephrosis, left 05/04/2022   Leukocytosis 05/04/2022   Chest pain 01/13/2022   Diabetic retinopathy associated with diabetes mellitus due to underlying condition (HCC) 01/13/2022   ESRD on hemodialysis (HCC) 01/13/2022   Deafness in right ear 08/11/2021   Open wound 01/25/2021   History of colon polyps 10/15/2020   Acute cholecystitis without calculus 09/09/2020   Type 2 diabetes mellitus, with  long-term current use of insulin  (HCC) 09/09/2020   Cryptogenic stroke (HCC) 07/13/2020   History of loop recorder 07/13/2020   Elevated troponin 04/03/2020   Acquired trigger finger 06/08/2019   Lymphedema 06/08/2019   Anemia 04/17/2019   Swelling of both lower extremities 01/10/2019   Facial droop 04/09/2018   Daytime somnolence 03/30/2018   Carotid artery disease (HCC) 02/03/2018   Intracranial vascular stenosis 09/12/2017   Cough 01/20/2017   Bell's palsy 11/10/2016    History of CVA (cerebrovascular accident) 11/10/2016   Benign localized hyperplasia of prostate with urinary obstruction 10/27/2016   History of nephrolithiasis 10/27/2016   TIA (transient ischemic attack) 10/20/2016   Near syncope 06/23/2016   Organic impotence 10/01/2015   Neuropathy 08/06/2015   Health care maintenance 08/06/2015   Hypertension 08/06/2015   Hypothyroidism 10/10/2013   Microalbuminuria 10/10/2013   Hyperlipidemia 10/10/2013   B12 deficiency 10/10/2013   Environmental allergies 07/04/2013    ONSET DATE: 3 years ago  REFERRING DIAG: unsteady gait  THERAPY DIAG:  Muscle weakness (generalized)  Unsteadiness on feet  Other abnormalities of gait and mobility  Difficulty in walking, not elsewhere classified  Abnormality of gait and mobility  Other lack of coordination  Rationale for Evaluation and Treatment: Rehabilitation  SUBJECTIVE:                                                                                                                                                                                             SUBJECTIVE STATEMENT:   Pt reports he had a stumble yesterday at church trying to get to the restroom.  Pt reports he tripped over another chair because he turned too quickly and caused him to fell onto another chair.  Pt reports no injury from what he can gather.  Pt notes some stiffness, but other than that, he's doing well.     Pt accompanied by: self  PERTINENT HISTORY: Patient returning to PT s/p hospitalization for pneumonia, , SVT, ESRD on hypodialysis. PMH DM, HTN, Anemia, Bells palsy, Hypothyroidism, Kidney stones, stroke and pseudotumour cerebri. Macular degeneration with impaired visual acuity   PAIN:  Are you having pain? Some L foot numbness/neuropathy  PRECAUTIONS: Fall  RED FLAGS: None   WEIGHT BEARING RESTRICTIONS: No  FALLS: Has patient fallen in last 6 months? No  LIVING ENVIRONMENT: Lives with: lives  alone Lives in: House/apartment Stairs: yes stairs in house Has following equipment at home: Single point cane  PLOF: Independent with basic ADLs  PATIENT GOALS: balance, strength  OBJECTIVE:  Note: Objective measures were completed at Evaluation unless otherwise noted.  DIAGNOSTIC FINDINGS: IMPRESSION: Interval resolution of previously  noted trace left parafalcine subdural hematoma. No acute intracranial process.  COGNITION: Overall cognitive status: Within functional limits for tasks assessed   SENSATION: WFL  COORDINATION: Heel slide: WFL RLE; slight decrease with LLE   MUSCLE TONE: WFL  POSTURE: rounded shoulders, forward head, and posterior pelvic tilt  LOWER EXTREMITY MMT:    MMT Right Eval Left Eval  Hip flexion 4 4  Hip extension    Hip abduction 3 3  Hip adduction 3+ 3+  Hip internal rotation    Hip external rotation    Knee flexion 4- 4-  Knee extension 4 4  Ankle dorsiflexion 3 2+  Ankle plantarflexion 3+ 3  Ankle inversion    Ankle eversion    (Blank rows = not tested)  BED MOBILITY:  Not tested  TRANSFERS: Sit to stand: CGA  Assistive device utilized: None     Stand to sit: CGA  Assistive device utilized: None     Chair to chair: CGA  Assistive device utilized: None       RAMP:  Not tested  CURB:  Findings: needs UE support to step up  STAIRS: heavy BUE support step over step negotiation.  GAIT: Findings: Gait Characteristics: decreased stride length, Left foot flat, wide BOS, and poor foot clearance- Left, Distance walked: 60 ft, Assistive device utilized:None, Level of assistance: CGA, and Comments: increased L ankle rolling due to not wearing AFO  FUNCTIONAL TESTS:  5 times sit to stand: 30.53 with 4 posterior LOB 6 minute walk test: perform next session  Berg Balance Scale: 26  PATIENT SURVEYS:  LEFS give next session                                                                                                                                TREATMENT DATE: 05/17/24  TherAct:   Sit to stand while holding 15# KB, x5, pt fatigued and unable to perform anymore and needed a rest break following  STS with no weight added, x5 to finish the total set of 10  Briefcase carries with 15# KB's, x2 laps each UE holding  Aura carries with 15# KB's, x2 laps each UE holding  Lateral step ups onto 6 step with UE support on support rail, x10 each direction  Seated pallof press with BlueTB, x10    PATIENT EDUCATION: Education details: goals, POC, HEP Person educated: Patient Education method: Explanation, Demonstration, Tactile cues, Verbal cues, and Handouts Education comprehension: verbalized understanding, returned demonstration, verbal cues required, tactile cues required, and needs further education  HOME EXERCISE PROGRAM: Access Code: [email or web address] URL: [email or web address] Date: 12/29/2023 Prepared by: Marina  Moser  Exercises - Leg Extension  - 1 x daily - 7 x weekly - 2 sets - 10 reps - 5 hold - Seated March  - 1 x daily - 7 x weekly - 2 sets - 10 reps - 5 hold - Seated Heel Raise  - 1 x daily - 7 x  weekly - 2 sets - 10 reps - 5 hold  GOALS: Goals reviewed with patient? Yes  SHORT TERM GOALS: Target date: 01/26/2024  Patient will be independent in home exercise program to improve strength/mobility for better functional independence with ADLs.  Baseline: Goal status: INITIAL  LONG TERM GOALS: Target date: 03/22/2024  Patient (< 41 years old) will complete five times sit to stand test in < 10 seconds indicating an increased LE strength and improved balance.  Baseline: 30.53 with 4 posterior LOB 03/29/24: 22.61 with 3 posterior LOB Goal status: PROGRESSING  2.   Patient will increase Berg Balance score by >45/56 to demonstrate decreased fall risk during functional activities.  Baseline: 5/5: 26  03/29/24: 32 Goal status: INITIAL  3.    Patient will increase six minute walk test distance  to >1200 for progression to age norm community ambulator and improve gait ability  Baseline: perform next session 5/12: 710 ft  03/29/24: 770' Goal status: PROGRESSING  4.  Patient will score > 60/80 on LEFS for improved functional mobility and quality of life.  Baseline: perform next session 5/12: 43 03/29/24: 39/80 Goal status: INITIAL    ASSESSMENT:  CLINICAL IMPRESSION:   Pt responded well to the exercises, although he was feeling more stiff and a little weaker than normal due to the fall.  Pt otherwise performed well and was able to do the tasks.  Pt would benefit from being on the NuStep at the next visit to challenge his endurance levels.   Pt will continue to benefit from skilled therapy to address remaining deficits in order to improve overall QoL and return to PLOF.       OBJECTIVE IMPAIRMENTS: Abnormal gait, cardiopulmonary status limiting activity, decreased activity tolerance, decreased balance, decreased coordination, decreased endurance, decreased knowledge of use of DME, decreased mobility, difficulty walking, decreased ROM, decreased strength, dizziness, impaired perceived functional ability, impaired flexibility, impaired vision/preception, improper body mechanics, postural dysfunction, and obesity.   ACTIVITY LIMITATIONS: carrying, lifting, bending, sitting, standing, squatting, stairs, transfers, bed mobility, toileting, dressing, reach over head, hygiene/grooming, locomotion level, and caring for others  PARTICIPATION LIMITATIONS: meal prep, cleaning, laundry, driving, shopping, community activity, occupation, and yard work  PERSONAL FACTORS: Age, Behavior pattern, Fitness, Past/current experiences, Time since onset of injury/illness/exacerbation, Transportation, and 3+ comorbidities: DM, HTN, Anemia, Bells palsy, Hypothyroidism, Kidney stones, stroke and pseudotumour cerebri. Macular degeneration with impaired visual acuity  are also affecting patient's functional  outcome.   REHAB POTENTIAL: Good  CLINICAL DECISION MAKING: Evolving/moderate complexity  EVALUATION COMPLEXITY: Moderate  PLAN:  PT FREQUENCY: 2x/week  PT DURATION: 12 weeks  PLANNED INTERVENTIONS: 97164- PT Re-evaluation, 97750- Physical Performance Testing, 97110-Therapeutic exercises, 97530- Therapeutic activity, [id number]- Neuromuscular re-education, 97535- Self Care, [id number]- Manual therapy, [id number]- Gait training, [phone]- Orthotic Initial, [phone]- Orthotic/Prosthetic subsequent, [phone]- Canalith repositioning, [id number]- Splinting, H9716- Electrical stimulation (unattended), [phone]- Electrical stimulation (manual), [id number]- Vasopneumatic device, [id number]- Ultrasound, [id number]- Traction (mechanical), [id number]- Ionotophoresis 4mg /ml Dexamethasone , Patient/Family education, Balance training, Stair training, Taping, Dry Needling, Joint mobilization, Spinal mobilization, Scar mobilization, Compression bandaging, Vestibular training, Visual/preceptual remediation/compensation, Cognitive remediation, DME instructions, Cryotherapy, Moist heat, and Biofeedback  PLAN FOR NEXT SESSION:  Korebalance balance, strength, L ankle strength, stairs, LE endurance   Fonda Simpers, PT, DPT Physical Therapist - [facility]  05/17/24, 1:29 PM

## 2024-05-18 NOTE — Telephone Encounter (Signed)
 Pt currently at dialysis now. HR is 45-48. Did not show for labs last week so he got his monthly labs today so once they result, they will be sent.

## 2024-05-18 NOTE — Telephone Encounter (Signed)
 LM for patient. He sees cardiology tomorrow. Please let him know to take his medications with him to his appointment tomorrow.

## 2024-05-18 NOTE — Telephone Encounter (Signed)
 Please confirm with Gregory Crane he is doing ok. Will need to confirm how he is taking his medication. Please schedule him for a f/u appt and ask him to bring his medications. Also need to see if he has a f/u appt with cardiology scheduled.

## 2024-05-19 ENCOUNTER — Ambulatory Visit

## 2024-05-19 ENCOUNTER — Ambulatory Visit: Admitting: Student

## 2024-05-19 DIAGNOSIS — M6281 Muscle weakness (generalized): Secondary | ICD-10-CM | POA: Diagnosis not present

## 2024-05-19 DIAGNOSIS — R269 Unspecified abnormalities of gait and mobility: Secondary | ICD-10-CM

## 2024-05-19 DIAGNOSIS — R2681 Unsteadiness on feet: Secondary | ICD-10-CM

## 2024-05-19 DIAGNOSIS — R2689 Other abnormalities of gait and mobility: Secondary | ICD-10-CM

## 2024-05-19 DIAGNOSIS — R262 Difficulty in walking, not elsewhere classified: Secondary | ICD-10-CM

## 2024-05-19 DIAGNOSIS — R278 Other lack of coordination: Secondary | ICD-10-CM

## 2024-05-19 NOTE — Therapy (Signed)
 OUTPATIENT PHYSICAL THERAPY NEURO TREATMENT   Patient Name: Gregory Crane MRN: [id number] DOB:[birth year], 53 y.o., male Today's Date: 05/19/2024   PCP: Glendia Shad  REFERRING PROVIDER: Glendia Shad  END OF SESSION:  PT End of Session - 05/19/24 1105     Visit Number 24    Number of Visits 39    Date for Recertification  06/21/24    Progress Note Due on Visit 20    PT Start Time 1105    PT Stop Time 1145    PT Time Calculation (min) 40 min    Equipment Utilized During Treatment Gait belt    Activity Tolerance Patient tolerated treatment well    Behavior During Therapy [facility] for tasks assessed/performed          Past Medical History:  Diagnosis Date   Allergy    Anemia    Bell's palsy    Diabetes mellitus without complication (HCC)    diet controlled   Fall 07/2023   Hypertension    Hypothyroidism    Kidney stones    Pseudotumor cerebri    Stroke [facility])    Past Surgical History:  Procedure Laterality Date   COLONOSCOPY WITH PROPOFOL  N/A 03/30/2020   Procedure: COLONOSCOPY WITH PROPOFOL ;  Surgeon: Maryruth Ole DASEN, MD;  Location: [facility] ENDOSCOPY;  Service: Endoscopy;  Laterality: N/A;   CORONARY STENT INTERVENTION N/A 03/17/2024   Procedure: CORONARY STENT INTERVENTION;  Surgeon: Swaziland, Peter M, MD;  Location: [facility] INVASIVE CV LAB;  Service: Cardiovascular;  Laterality: N/A;   LEFT HEART CATH AND CORONARY ANGIOGRAPHY N/A 03/17/2024   Procedure: LEFT HEART CATH AND CORONARY ANGIOGRAPHY;  Surgeon: Swaziland, Peter M, MD;  Location: [facility] INVASIVE CV LAB;  Service: Cardiovascular;  Laterality: N/A;   LOOP RECORDER INSERTION N/A 01/27/2018   Procedure: LOOP RECORDER INSERTION;  Surgeon: Fernande Elspeth BROCKS, MD;  Location: [facility] INVASIVE CV LAB;  Service: Cardiovascular;  Laterality: N/A;   LUMBAR PUNCTURE     as child   NO PAST SURGERIES     REMOVAL OF A DIALYSIS CATHETER     TEE WITHOUT CARDIOVERSION N/A 01/07/2018   Procedure: TRANSESOPHAGEAL ECHOCARDIOGRAM (TEE);   Surgeon: Perla Evalene PARAS, MD;  Location: [facility] ORS;  Service: Cardiovascular;  Laterality: N/A;   Patient Active Problem List   Diagnosis Date Noted   Bradycardia 04/30/2024   CAD S/P percutaneous coronary angioplasty 03/17/2024   Non-ST elevation (NSTEMI) myocardial infarction (HCC) 03/16/2024   Hypervolemia 03/14/2024   Hypoxic respiratory failure (HCC) 03/14/2024   Demand ischemia (HCC) 03/14/2024   Left hip pain 01/30/2024   Acute on chronic heart failure with preserved ejection fraction (HFpEF) (HCC) 12/26/2023   Community acquired pneumonia 12/24/2023   Acute respiratory failure with hypoxia (HCC) 12/23/2023   SVT (supraventricular tachycardia) 08/10/2023   Atrial fibrillation (HCC) 07/03/2023   Atrial fibrillation with RVR (HCC) 07/02/2023   Fall 06/15/2023   Non-compliance with renal dialysis 05/02/2023   Obesity (BMI 30-39.9) 04/30/2023   Volume overload 04/29/2023   Renal osteodystrophy 11/25/2022   Unsteady gait 10/05/2022   Steal syndrome of dialysis vascular access 05/28/2022   Hydronephrosis, left 05/04/2022   Leukocytosis 05/04/2022   Chest pain 01/13/2022   Diabetic retinopathy associated with diabetes mellitus due to underlying condition (HCC) 01/13/2022   ESRD on hemodialysis (HCC) 01/13/2022   Deafness in right ear 08/11/2021   Open wound 01/25/2021   History of colon polyps 10/15/2020   Acute cholecystitis without calculus 09/09/2020   Type 2 diabetes mellitus, with long-term current  use of insulin  (HCC) 09/09/2020   Cryptogenic stroke (HCC) 07/13/2020   History of loop recorder 07/13/2020   Elevated troponin 04/03/2020   Acquired trigger finger 06/08/2019   Lymphedema 06/08/2019   Anemia 04/17/2019   Swelling of both lower extremities 01/10/2019   Facial droop 04/09/2018   Daytime somnolence 03/30/2018   Carotid artery disease 02/03/2018   Intracranial vascular stenosis 09/12/2017   Cough 01/20/2017   Bell's palsy 11/10/2016   History of CVA  (cerebrovascular accident) 11/10/2016   Benign localized hyperplasia of prostate with urinary obstruction 10/27/2016   History of nephrolithiasis 10/27/2016   TIA (transient ischemic attack) 10/20/2016   Near syncope 06/23/2016   Organic impotence 10/01/2015   Neuropathy 08/06/2015   Health care maintenance 08/06/2015   Hypertension 08/06/2015   Hypothyroidism 10/10/2013   Microalbuminuria 10/10/2013   Hyperlipidemia 10/10/2013   B12 deficiency 10/10/2013   Environmental allergies 07/04/2013    ONSET DATE: 3 years ago  REFERRING DIAG: unsteady gait  THERAPY DIAG:  Muscle weakness (generalized)  Unsteadiness on feet  Other abnormalities of gait and mobility  Difficulty in walking, not elsewhere classified  Abnormality of gait and mobility  Other lack of coordination  Rationale for Evaluation and Treatment: Rehabilitation  SUBJECTIVE:                                                                                                                                                                                             SUBJECTIVE STATEMENT:   Pt reports he is feeling good and ready for whatever tortuous things planned by the author.      Pt accompanied by: self  PERTINENT HISTORY: Patient returning to PT s/p hospitalization for pneumonia, , SVT, ESRD on hypodialysis. PMH DM, HTN, Anemia, Bells palsy, Hypothyroidism, Kidney stones, stroke and pseudotumour cerebri. Macular degeneration with impaired visual acuity   PAIN:  Are you having pain? Some L foot numbness/neuropathy  PRECAUTIONS: Fall  RED FLAGS: None   WEIGHT BEARING RESTRICTIONS: No  FALLS: Has patient fallen in last 6 months? No  LIVING ENVIRONMENT: Lives with: lives alone Lives in: House/apartment Stairs: yes stairs in house Has following equipment at home: Single point cane  PLOF: Independent with basic ADLs  PATIENT GOALS: balance, strength  OBJECTIVE:  Note: Objective measures were  completed at Evaluation unless otherwise noted.  DIAGNOSTIC FINDINGS: IMPRESSION: Interval resolution of previously noted trace left parafalcine subdural hematoma. No acute intracranial process.  COGNITION: Overall cognitive status: Within functional limits for tasks assessed   SENSATION: WFL  COORDINATION: Heel slide: WFL RLE; slight decrease with LLE   MUSCLE TONE: WFL  POSTURE: rounded shoulders,  forward head, and posterior pelvic tilt  LOWER EXTREMITY MMT:    MMT Right Eval Left Eval  Hip flexion 4 4  Hip extension    Hip abduction 3 3  Hip adduction 3+ 3+  Hip internal rotation    Hip external rotation    Knee flexion 4- 4-  Knee extension 4 4  Ankle dorsiflexion 3 2+  Ankle plantarflexion 3+ 3  Ankle inversion    Ankle eversion    (Blank rows = not tested)  BED MOBILITY:  Not tested  TRANSFERS: Sit to stand: CGA  Assistive device utilized: None     Stand to sit: CGA  Assistive device utilized: None     Chair to chair: CGA  Assistive device utilized: None       RAMP:  Not tested  CURB:  Findings: needs UE support to step up  STAIRS: heavy BUE support step over step negotiation.  GAIT: Findings: Gait Characteristics: decreased stride length, Left foot flat, wide BOS, and poor foot clearance- Left, Distance walked: 60 ft, Assistive device utilized:None, Level of assistance: CGA, and Comments: increased L ankle rolling due to not wearing AFO  FUNCTIONAL TESTS:  5 times sit to stand: 30.53 with 4 posterior LOB 6 minute walk test: perform next session  Berg Balance Scale: 26  PATIENT SURVEYS:  LEFS give next session                                                                                                                               TREATMENT DATE: 05/19/24  TherAct:   The patient completed 10 minutes at level(s) 2-6 on the NuStep hill program using both BUE/BLE reciprocal movements to promote strength, endurance, and cardiorespiratory  fitness. PT increased the resistance level and monitored the patient's response to the intervention throughout. The patient required min cueing for technique and supervision level of assistance.   Seated hip adductions with physioball, 3 sec holds, 2x10  Seated soccer kicks with 5# AW's donned bilaterally, performing for 5 total minutes  Ambulation with 5# AW's around gym before pt reporting he needed to use the restroom at the conclusion of the session.     PATIENT EDUCATION: Education details: goals, POC, HEP Person educated: Patient Education method: Explanation, Demonstration, Tactile cues, Verbal cues, and Handouts Education comprehension: verbalized understanding, returned demonstration, verbal cues required, tactile cues required, and needs further education  HOME EXERCISE PROGRAM: Access Code: [email or web address] URL: [email or web address] Date: 12/29/2023 Prepared by: Marina  Moser  Exercises - Leg Extension  - 1 x daily - 7 x weekly - 2 sets - 10 reps - 5 hold - Seated March  - 1 x daily - 7 x weekly - 2 sets - 10 reps - 5 hold - Seated Heel Raise  - 1 x daily - 7 x weekly - 2 sets - 10 reps - 5 hold  GOALS: Goals reviewed with patient? Yes  SHORT TERM GOALS: Target date: 01/26/2024  Patient will be independent in home exercise program to improve strength/mobility for better functional independence with ADLs.  Baseline: Goal status: INITIAL  LONG TERM GOALS: Target date: 03/22/2024  Patient (< 62 years old) will complete five times sit to stand test in < 10 seconds indicating an increased LE strength and improved balance.  Baseline: 30.53 with 4 posterior LOB 03/29/24: 22.61 with 3 posterior LOB Goal status: PROGRESSING  2.   Patient will increase Berg Balance score by >45/56 to demonstrate decreased fall risk during functional activities.  Baseline: 5/5: 26  03/29/24: 32 Goal status: INITIAL  3.    Patient will increase six minute walk test distance to >1200 for  progression to age norm community ambulator and improve gait ability  Baseline: perform next session 5/12: 710 ft  03/29/24: 770' Goal status: PROGRESSING  4.  Patient will score > 60/80 on LEFS for improved functional mobility and quality of life.  Baseline: perform next session 5/12: 43 03/29/24: 39/80 Goal status: INITIAL    ASSESSMENT:  CLINICAL IMPRESSION:   Pt responded well to the exercises and was able to tolerate increased resistance with the 5# AW's.  Pt noted that following the NuStep and the soccer kicks, his legs felt like noodles and felt like it was a good exercise.  Pt continues to demonstrate improved strengthening of the LE's which will hopefully assist in improving overall balance of the pt.   Pt will continue to benefit from skilled therapy to address remaining deficits in order to improve overall QoL and return to PLOF.        OBJECTIVE IMPAIRMENTS: Abnormal gait, cardiopulmonary status limiting activity, decreased activity tolerance, decreased balance, decreased coordination, decreased endurance, decreased knowledge of use of DME, decreased mobility, difficulty walking, decreased ROM, decreased strength, dizziness, impaired perceived functional ability, impaired flexibility, impaired vision/preception, improper body mechanics, postural dysfunction, and obesity.   ACTIVITY LIMITATIONS: carrying, lifting, bending, sitting, standing, squatting, stairs, transfers, bed mobility, toileting, dressing, reach over head, hygiene/grooming, locomotion level, and caring for others  PARTICIPATION LIMITATIONS: meal prep, cleaning, laundry, driving, shopping, community activity, occupation, and yard work  PERSONAL FACTORS: Age, Behavior pattern, Fitness, Past/current experiences, Time since onset of injury/illness/exacerbation, Transportation, and 3+ comorbidities: DM, HTN, Anemia, Bells palsy, Hypothyroidism, Kidney stones, stroke and pseudotumour cerebri. Macular degeneration with  impaired visual acuity  are also affecting patient's functional outcome.   REHAB POTENTIAL: Good  CLINICAL DECISION MAKING: Evolving/moderate complexity  EVALUATION COMPLEXITY: Moderate  PLAN:  PT FREQUENCY: 2x/week  PT DURATION: 12 weeks  PLANNED INTERVENTIONS: 97164- PT Re-evaluation, 97750- Physical Performance Testing, 97110-Therapeutic exercises, 97530- Therapeutic activity, [id number]- Neuromuscular re-education, 97535- Self Care, 02859- Manual therapy, [id number]- Gait training, [id number]- Orthotic Initial, [phone]- Orthotic/Prosthetic subsequent, [id number]- Canalith repositioning, [id number]- Splinting, H9716- Electrical stimulation (unattended), [phone]- Electrical stimulation (manual), [id number]- Vasopneumatic device, [id number]- Ultrasound, [id number]- Traction (mechanical), [id number]- Ionotophoresis 4mg /ml Dexamethasone , Patient/Family education, Balance training, Stair training, Taping, Dry Needling, Joint mobilization, Spinal mobilization, Scar mobilization, Compression bandaging, Vestibular training, Visual/preceptual remediation/compensation, Cognitive remediation, DME instructions, Cryotherapy, Moist heat, and Biofeedback  PLAN FOR NEXT SESSION:  Korebalance balance, strength, L ankle strength, stairs, LE endurance   Fonda Simpers, PT, DPT Physical Therapist - [facility]  [facility]  05/19/24, 4:52 PM

## 2024-05-19 NOTE — Progress Notes (Deleted)
 Cardiology Clinic Note   Date: 05/19/2024 ID: Darl, Kuss 1971/08/26, MRN [id number]  Primary Cardiologist:  Lonni LITTIE Nanas, MD  Chief Complaint   Cyan Moultrie Lievanos is a 53 y.o. male who presents to the clinic today for ***  Patient Profile   Owyn Raulston is followed by *** for the history outlined below.      Past medical history significant for: CAD. LHC 03/17/2024 (NSTEMI): D1 70%.  Proximal mid LAD 50%.  Mid LCx 50%.  Proximal RCA 90%.  Mid RCA 50%.  PCI with DES 2.25 x 12 to mid RCA. Chronic HFpEF. Echo 03/16/2024: EF 60 to 65%.  No RWMA.  Moderate concentric LVH.  Grade I DD.  Normal RV function, moderate RVH.  Aortic valve sclerosis without stenosis. PAF. Hypertension. Hyperlipidemia. LPa 03/18/2024: 63.9. Lipid panel 10/09/2023: LDL 66, HDL 34, TG 146, total 129. CVA. Loop recorder implantation 2019. No significant arrhythmias/A-fib x 3 years. Loop removal May 2023. Hypothyroidism. T2DM. ESRD. On HD  In summary, patient with history of a cryptogenic stroke in 2019, Loop placement for 3 years with no significant arrhythmias/atrial fibrillation noted.  Loop removed May 2023.  Echocardiogram in 2019 with normal LV function, no intracardiac source of embolism, and normal bubble study.  Patient underwent hospital admission November 2024.  Hemodialysis team noted patient in atrial fibrillation with RVR.  High-sensitivity troponin was mildly elevated at 236, felt to be demand supply mismatch in the setting of end-stage renal disease and atrial fibrillation with RVR.  Echocardiogram showed EF of 50-55% with no RWMA.  He converted to sinus rhythm on diltiazem  drip and oral metoprolol  every 6 hours.  He was started on apixaban  5 mg twice daily for CHA2DS2-VASc score of at least 4 for stroke prophylaxis.  Metoprolol  was reduced to tartrate 25 mg twice daily in the setting of bradycardia.  He was recommended follow-up with EP for consideration of antiarrhythmic versus  ablation, although it did not appear that he followed up.  After that he was lost to follow-up in our clinic.  He was then evaluated in the [facility] ED by our team on 12/23/2023.  Upon arrival to the ED BP 161/112, HR 119, respirations of 26, temperature 97.5.  He was placed on CPAP.  Initial labs: Potassium 6.2, glucose 226, BUN 88, creatinine 10.42, calcium  7.6, WBC 11.2, BNP 1683.  Troponin mildly elevated 21>>25.  CT of the chest showed multifocal pneumonia with small bilateral pleural effusions.  He reported he was doing fairly well until the day prior around 10 PM when he had to walk home.  He suddenly felt extremely short of breath worsened with lying flat.  Leaning over slightly seemed to improve breathing for short period of time.  Denied having any chest pain, palpitations or lightheadedness.  He had noticed some leg swelling over the last several days and suspected that he had been overweight though he did not know by how much.  He states he is intermittently compliant with medications and also does not adhere to his fluid restriction on a consistent basis.  Last dialyzed approximately 5 days prior and does not go on Saturdays anymore due to his quality of life.  He typically feels quite poorly after dialysis and does not think that he can tolerate doing 3 days a week.  With his elevated heart rate EKG concerning for SVT/atrial tachycardia.  He was started on amiodarone  with improvement in heart rates.  He was dialyzed last with ongoing management per  nephrology.  He was discharged on 12/26/2023.  Recent hospital admission 7/20 - 03/15/2024 for dyspnea and elevated high-sensitivity troponin.  He presented for worsening shortness of breath, lower extremity edema, abdominal distention.  He had missed 1 week of dialysis.  He was noted to be in atrial flutter with rapid ventricular response.  Given tachyarrhythmia patient was started on amiodarone .  While in the emergency department patient noted to have  increased work of breathing and was ultimately placed on BiPAP for airway management.  Nephrology was consulted to assist with volume management.  Given atrial flutter with RVR as well as persistent dyspnea despite receiving hemodialysis cardiology was consulted.  High-sensitivity troponins peaked at 7307.  He was taken to the Cath Lab and had left heart catheterization with subsequent PCI with DES to the mid RCA.  Recommendation for aspirin  81 mg x 1 month and clopidogrel  75 mg daily for 1 year.  Echo demonstrated normal LV/RV function.  He had hemodialysis on the 21st, 22nd, 24th.  He was chest pain-free and felt better.  He was encouraged to remain compliant with his dialysis schedule.  He was continued on amiodarone , metoprolol , and Eliquis .     History of Present Illness    Today, patient ***  CAD S/p PCI with DES to mid RCA July 2025.  Patient*** - Continue Plavix , metoprolol , atorvastatin .  Chronic HFpEF Echo July 2025 demonstrated normal LV/RV function, moderate LVH/RVH, Grade I DD.  Patient*** Euvolemic and well compensated on exam. - Continue metoprolol , hydralazine , Lasix *** - Volume managed by nephrology.  PAF Patient with recent hospital admission July 2025 after missing 1 week of dialysis and found to be a-flutter.  Denies spontaneous bleeding concerns. Patient ***EKG*** - Continue amiodarone , diltiazem , metoprolol , Eliquis .  Hypertension BP today*** - Continue Metoprolol , hydralazine , diltiazem .  Hyperlipidemia LDL 66 February 2025, at goal. - Continue atorvastatin .  ROS: All other systems reviewed and are otherwise negative except as noted in History of Present Illness.  EKGs/Labs Reviewed        03/15/2024: ALT 12; AST 21 03/18/2024: BUN 84; Creatinine, Ser 9.58; Potassium 4.1; Sodium 134   03/18/2024: Hemoglobin 9.3; WBC 5.8   03/15/2024: TSH 6.757   12/23/2023: B Natriuretic Peptide 1,683.5  ***  Risk Assessment/Calculations    {Does this patient have  ATRIAL FIBRILLATION?:[id number]} No BP recorded.  {Refresh Note OR Click here to enter BP  :1}***        Physical Exam    VS:  There were no vitals taken for this visit. , BMI There is no height or weight on file to calculate BMI.  GEN: Well nourished, well developed, in no acute distress. Neck: No JVD or carotid bruits. Cardiac: *** RRR. *** No murmur. No rubs or gallops.   Respiratory:  Respirations regular and unlabored. Clear to auscultation without rales, wheezing or rhonchi. GI: Soft, nontender, nondistended. Extremities: Radials/DP/PT 2+ and equal bilaterally. No clubbing or cyanosis. No edema ***  Skin: Warm and dry, no rash. Neuro: Strength intact.  Assessment & Plan   ***  Disposition: ***  {The patient has an active order for outpatient cardiac rehabilitation.   Please indicate if the patient is ready to start. Do NOT delete this.  It will auto delete.  Refresh note, then sign.              Click here to document readiness and see contraindications.  :1}  Cardiac Rehabilitation Eligibility Assessment      {Are you ordering a CV Procedure (e.g. stress  test, cath, DCCV, TEE, etc)?   Press F2        :[id number]}   Signed, Barnie HERO. Catarina Huntley, DNP, NP-C

## 2024-05-24 ENCOUNTER — Ambulatory Visit

## 2024-05-24 NOTE — Therapy (Incomplete)
 OUTPATIENT PHYSICAL THERAPY NEURO TREATMENT   Patient Name: Gregory Crane MRN: [id number] DOB:1970/12/06, 53 y.o., male Today's Date: 05/24/2024   PCP: Glendia Shad  REFERRING PROVIDER: Glendia Shad  END OF SESSION:    Past Medical History:  Diagnosis Date   Allergy    Anemia    Bell's palsy    Diabetes mellitus without complication (HCC)    diet controlled   Fall 07/2023   Hypertension    Hypothyroidism    Kidney stones    Pseudotumor cerebri    Stroke [facility])    Past Surgical History:  Procedure Laterality Date   COLONOSCOPY WITH PROPOFOL  N/A 03/30/2020   Procedure: COLONOSCOPY WITH PROPOFOL ;  Surgeon: Maryruth Ole DASEN, MD;  Location: [facility] ENDOSCOPY;  Service: Endoscopy;  Laterality: N/A;   CORONARY STENT INTERVENTION N/A 03/17/2024   Procedure: CORONARY STENT INTERVENTION;  Surgeon: Swaziland, Peter M, MD;  Location: [facility] INVASIVE CV LAB;  Service: Cardiovascular;  Laterality: N/A;   LEFT HEART CATH AND CORONARY ANGIOGRAPHY N/A 03/17/2024   Procedure: LEFT HEART CATH AND CORONARY ANGIOGRAPHY;  Surgeon: Swaziland, Peter M, MD;  Location: [name] INVASIVE CV LAB;  Service: Cardiovascular;  Laterality: N/A;   LOOP RECORDER INSERTION N/A 01/27/2018   Procedure: LOOP RECORDER INSERTION;  Surgeon: Fernande Elspeth BROCKS, MD;  Location: [facility] INVASIVE CV LAB;  Service: Cardiovascular;  Laterality: N/A;   LUMBAR PUNCTURE     as child   NO PAST SURGERIES     REMOVAL OF A DIALYSIS CATHETER     TEE WITHOUT CARDIOVERSION N/A 01/07/2018   Procedure: TRANSESOPHAGEAL ECHOCARDIOGRAM (TEE);  Surgeon: Perla Evalene PARAS, MD;  Location: [facility] ORS;  Service: Cardiovascular;  Laterality: N/A;   Patient Active Problem List   Diagnosis Date Noted   Bradycardia 04/30/2024   CAD S/P percutaneous coronary angioplasty 03/17/2024   Non-ST elevation (NSTEMI) myocardial infarction (HCC) 03/16/2024   Hypervolemia 03/14/2024   Hypoxic respiratory failure (HCC) 03/14/2024   Demand ischemia (HCC) 03/14/2024    Left hip pain 01/30/2024   Acute on chronic heart failure with preserved ejection fraction (HFpEF) (HCC) 12/26/2023   Community acquired pneumonia 12/24/2023   Acute respiratory failure with hypoxia (HCC) 12/23/2023   SVT (supraventricular tachycardia) 08/10/2023   Atrial fibrillation (HCC) 07/03/2023   Atrial fibrillation with RVR (HCC) 07/02/2023   Fall 06/15/2023   Non-compliance with renal dialysis 05/02/2023   Obesity (BMI 30-39.9) 04/30/2023   Volume overload 04/29/2023   Renal osteodystrophy 11/25/2022   Unsteady gait 10/05/2022   Steal syndrome of dialysis vascular access 05/28/2022   Hydronephrosis, left 05/04/2022   Leukocytosis 05/04/2022   Chest pain 01/13/2022   Diabetic retinopathy associated with diabetes mellitus due to underlying condition (HCC) 01/13/2022   ESRD on hemodialysis (HCC) 01/13/2022   Deafness in right ear 08/11/2021   Open wound 01/25/2021   History of colon polyps 10/15/2020   Acute cholecystitis without calculus 09/09/2020   Type 2 diabetes mellitus, with long-term current use of insulin  (HCC) 09/09/2020   Cryptogenic stroke (HCC) 07/13/2020   History of loop recorder 07/13/2020   Elevated troponin 04/03/2020   Acquired trigger finger 06/08/2019   Lymphedema 06/08/2019   Anemia 04/17/2019   Swelling of both lower extremities 01/10/2019   Facial droop 04/09/2018   Daytime somnolence 03/30/2018   Carotid artery disease 02/03/2018   Intracranial vascular stenosis 09/12/2017   Cough 01/20/2017   Bell's palsy 11/10/2016   History of CVA (cerebrovascular accident) 11/10/2016   Benign localized hyperplasia of prostate with urinary obstruction 10/27/2016  History of nephrolithiasis 10/27/2016   TIA (transient ischemic attack) 10/20/2016   Near syncope 06/23/2016   Organic impotence 10/01/2015   Neuropathy 08/06/2015   Health care maintenance 08/06/2015   Hypertension 08/06/2015   Hypothyroidism 10/10/2013   Microalbuminuria 10/10/2013    Hyperlipidemia 10/10/2013   B12 deficiency 10/10/2013   Environmental allergies 07/04/2013    ONSET DATE: 3 years ago  REFERRING DIAG: unsteady gait  THERAPY DIAG:  No diagnosis found.  Rationale for Evaluation and Treatment: Rehabilitation  SUBJECTIVE:                                                                                                                                                                                             SUBJECTIVE STATEMENT:   ***  Pt accompanied by: self  PERTINENT HISTORY: Patient returning to PT s/p hospitalization for pneumonia, , SVT, ESRD on hypodialysis. PMH DM, HTN, Anemia, Bells palsy, Hypothyroidism, Kidney stones, stroke and pseudotumour cerebri. Macular degeneration with impaired visual acuity   PAIN:  Are you having pain? Some L foot numbness/neuropathy  PRECAUTIONS: Fall  RED FLAGS: None   WEIGHT BEARING RESTRICTIONS: No  FALLS: Has patient fallen in last 6 months? No  LIVING ENVIRONMENT: Lives with: lives alone Lives in: House/apartment Stairs: yes stairs in house Has following equipment at home: Single point cane  PLOF: Independent with basic ADLs  PATIENT GOALS: balance, strength  OBJECTIVE:  Note: Objective measures were completed at Evaluation unless otherwise noted.  DIAGNOSTIC FINDINGS: IMPRESSION: Interval resolution of previously noted trace left parafalcine subdural hematoma. No acute intracranial process.  COGNITION: Overall cognitive status: Within functional limits for tasks assessed   SENSATION: WFL  COORDINATION: Heel slide: WFL RLE; slight decrease with LLE   MUSCLE TONE: WFL  POSTURE: rounded shoulders, forward head, and posterior pelvic tilt  LOWER EXTREMITY MMT:    MMT Right Eval Left Eval  Hip flexion 4 4  Hip extension    Hip abduction 3 3  Hip adduction 3+ 3+  Hip internal rotation    Hip external rotation    Knee flexion 4- 4-  Knee extension 4 4  Ankle dorsiflexion  3 2+  Ankle plantarflexion 3+ 3  Ankle inversion    Ankle eversion    (Blank rows = not tested)  BED MOBILITY:  Not tested  TRANSFERS: Sit to stand: CGA  Assistive device utilized: None     Stand to sit: CGA  Assistive device utilized: None     Chair to chair: CGA  Assistive device utilized: None       RAMP:  Not tested  CURB:  Findings: needs UE  support to step up  STAIRS: heavy BUE support step over step negotiation.  GAIT: Findings: Gait Characteristics: decreased stride length, Left foot flat, wide BOS, and poor foot clearance- Left, Distance walked: 60 ft, Assistive device utilized:None, Level of assistance: CGA, and Comments: increased L ankle rolling due to not wearing AFO  FUNCTIONAL TESTS:  5 times sit to stand: 30.53 with 4 posterior LOB 6 minute walk test: perform next session  Berg Balance Scale: 26  PATIENT SURVEYS:  LEFS give next session                                                                                                                               TREATMENT DATE: 05/24/24  *** TherAct:   The patient completed 10 minutes at level(s) 2-6 on the NuStep hill program using both BUE/BLE reciprocal movements to promote strength, endurance, and cardiorespiratory fitness. PT increased the resistance level and monitored the patient's response to the intervention throughout. The patient required min cueing for technique and supervision level of assistance.   Seated hip adductions with physioball, 3 sec holds, 2x10  Seated soccer kicks with 5# AW's donned bilaterally, performing for 5 total minutes  Ambulation with 5# AW's around gym before pt reporting he needed to use the restroom at the conclusion of the session.     PATIENT EDUCATION: Education details: goals, POC, HEP Person educated: Patient Education method: Explanation, Demonstration, Tactile cues, Verbal cues, and Handouts Education comprehension: verbalized understanding, returned  demonstration, verbal cues required, tactile cues required, and needs further education  HOME EXERCISE PROGRAM: Access Code: [email or web address] URL: [email or web address] Date: 12/29/2023 Prepared by: Marina  Moser  Exercises - Leg Extension  - 1 x daily - 7 x weekly - 2 sets - 10 reps - 5 hold - Seated March  - 1 x daily - 7 x weekly - 2 sets - 10 reps - 5 hold - Seated Heel Raise  - 1 x daily - 7 x weekly - 2 sets - 10 reps - 5 hold  GOALS: Goals reviewed with patient? Yes  SHORT TERM GOALS: Target date: 01/26/2024  Patient will be independent in home exercise program to improve strength/mobility for better functional independence with ADLs.  Baseline: Goal status: INITIAL  LONG TERM GOALS: Target date: 03/22/2024  Patient (< 78 years old) will complete five times sit to stand test in < 10 seconds indicating an increased LE strength and improved balance.  Baseline: 30.53 with 4 posterior LOB 03/29/24: 22.61 with 3 posterior LOB Goal status: PROGRESSING  2.   Patient will increase Berg Balance score by >45/56 to demonstrate decreased fall risk during functional activities.  Baseline: 5/5: 26  03/29/24: 32 Goal status: INITIAL  3.    Patient will increase six minute walk test distance to >1200 for progression to age norm community ambulator and improve gait ability  Baseline: perform next session 5/12: 710 ft  03/29/24: 770' Goal status: PROGRESSING  4.  Patient will score > 60/80 on LEFS for improved functional mobility and quality of life.  Baseline: perform next session 5/12: 43 03/29/24: 39/80 Goal status: INITIAL    ASSESSMENT:  CLINICAL IMPRESSION:   ***      OBJECTIVE IMPAIRMENTS: Abnormal gait, cardiopulmonary status limiting activity, decreased activity tolerance, decreased balance, decreased coordination, decreased endurance, decreased knowledge of use of DME, decreased mobility, difficulty walking, decreased ROM, decreased strength, dizziness, impaired  perceived functional ability, impaired flexibility, impaired vision/preception, improper body mechanics, postural dysfunction, and obesity.   ACTIVITY LIMITATIONS: carrying, lifting, bending, sitting, standing, squatting, stairs, transfers, bed mobility, toileting, dressing, reach over head, hygiene/grooming, locomotion level, and caring for others  PARTICIPATION LIMITATIONS: meal prep, cleaning, laundry, driving, shopping, community activity, occupation, and yard work  PERSONAL FACTORS: Age, Behavior pattern, Fitness, Past/current experiences, Time since onset of injury/illness/exacerbation, Transportation, and 3+ comorbidities: DM, HTN, Anemia, Bells palsy, Hypothyroidism, Kidney stones, stroke and pseudotumour cerebri. Macular degeneration with impaired visual acuity  are also affecting patient's functional outcome.   REHAB POTENTIAL: Good  CLINICAL DECISION MAKING: Evolving/moderate complexity  EVALUATION COMPLEXITY: Moderate  PLAN:  PT FREQUENCY: 2x/week  PT DURATION: 12 weeks  PLANNED INTERVENTIONS: 97164- PT Re-evaluation, 97750- Physical Performance Testing, 97110-Therapeutic exercises, 97530- Therapeutic activity, [id number]- Neuromuscular re-education, 97535- Self Care, 02859- Manual therapy, [id number]- Gait training, [phone]- Orthotic Initial, [id number]- Orthotic/Prosthetic subsequent, [phone]- Canalith repositioning, [id number]- Splinting, H9716- Electrical stimulation (unattended), [phone]- Electrical stimulation (manual), [id number]- Vasopneumatic device, [id number]- Ultrasound, [id number]- Traction (mechanical), [id number]- Ionotophoresis 4mg /ml Dexamethasone , Patient/Family education, Balance training, Stair training, Taping, Dry Needling, Joint mobilization, Spinal mobilization, Scar mobilization, Compression bandaging, Vestibular training, Visual/preceptual remediation/compensation, Cognitive remediation, DME instructions, Cryotherapy, Moist heat, and Biofeedback  PLAN FOR NEXT SESSION:  *** Korebalance balance,  strength, L ankle strength, stairs, LE endurance   Fonda Simpers, PT, DPT Physical Therapist -   [facility]  05/24/24, 9:20 AM

## 2024-05-26 ENCOUNTER — Ambulatory Visit: Attending: Internal Medicine

## 2024-05-26 DIAGNOSIS — M6281 Muscle weakness (generalized): Secondary | ICD-10-CM | POA: Diagnosis present

## 2024-05-26 DIAGNOSIS — R269 Unspecified abnormalities of gait and mobility: Secondary | ICD-10-CM | POA: Insufficient documentation

## 2024-05-26 DIAGNOSIS — R2689 Other abnormalities of gait and mobility: Secondary | ICD-10-CM | POA: Diagnosis present

## 2024-05-26 DIAGNOSIS — R278 Other lack of coordination: Secondary | ICD-10-CM | POA: Diagnosis present

## 2024-05-26 DIAGNOSIS — R262 Difficulty in walking, not elsewhere classified: Secondary | ICD-10-CM | POA: Diagnosis present

## 2024-05-26 DIAGNOSIS — R2681 Unsteadiness on feet: Secondary | ICD-10-CM | POA: Diagnosis present

## 2024-05-26 NOTE — Therapy (Signed)
 OUTPATIENT PHYSICAL THERAPY NEURO TREATMENT   Patient Name: Gregory Crane MRN: [id number] DOB:[birth year], 53 y.o., male Today's Date: 05/26/2024   PCP: Glendia Shad  REFERRING PROVIDER: Glendia Shad  END OF SESSION:  PT End of Session - 05/26/24 1110     Visit Number 25    Number of Visits 39    Date for Recertification  06/21/24    Progress Note Due on Visit 20    PT Start Time 1106    PT Stop Time 1145    PT Time Calculation (min) 39 min    Equipment Utilized During Treatment Gait belt    Activity Tolerance Patient tolerated treatment well    Behavior During Therapy [facility] for tasks assessed/performed         Past Medical History:  Diagnosis Date   Allergy    Anemia    Bell's palsy    Diabetes mellitus without complication (HCC)    diet controlled   Fall 07/2023   Hypertension    Hypothyroidism    Kidney stones    Pseudotumor cerebri    Stroke [facility])    Past Surgical History:  Procedure Laterality Date   COLONOSCOPY WITH PROPOFOL  N/A 03/30/2020   Procedure: COLONOSCOPY WITH PROPOFOL ;  Surgeon: Maryruth Ole DASEN, MD;  Location: [facility] ENDOSCOPY;  Service: Endoscopy;  Laterality: N/A;   CORONARY STENT INTERVENTION N/A 03/17/2024   Procedure: CORONARY STENT INTERVENTION;  Surgeon: Swazil[place], Peter M, MD;  Location: [facility] INVASIVE CV LAB;  Service: Cardiovascular;  Laterality: N/A;   LEFT HEART CATH AND CORONARY ANGIOGRAPHY N/A 03/17/2024   Procedure: LEFT HEART CATH AND CORONARY ANGIOGRAPHY;  Surgeon: Swaziland, Peter M, MD;  Location: [facility] INVASIVE CV LAB;  Service: Cardiovascular;  Laterality: N/A;   LOOP RECORDER INSERTION N/A 01/27/2018   Procedure: LOOP RECORDER INSERTION;  Surgeon: Fernande Elspeth BROCKS, MD;  Location: [facility] INVASIVE CV LAB;  Service: Cardiovascular;  Laterality: N/A;   LUMBAR PUNCTURE     as child   NO PAST SURGERIES     REMOVAL OF A DIALYSIS CATHETER     TEE WITHOUT CARDIOVERSION N/A 01/07/2018   Procedure: TRANSESOPHAGEAL ECHOCARDIOGRAM (TEE);   Surgeon: Perla Evalene PARAS, MD;  Location: [facility] ORS;  Service: Cardiovascular;  Laterality: N/A;   Patient Active Problem List   Diagnosis Date Noted   Bradycardia 04/30/2024   CAD S/P percutaneous coronary angioplasty 03/17/2024   Non-ST elevation (NSTEMI) myocardial infarction (HCC) 03/16/2024   Hypervolemia 03/14/2024   Hypoxic respiratory failure (HCC) 03/14/2024   Demand ischemia (HCC) 03/14/2024   Left hip pain 01/30/2024   Acute on chronic heart failure with preserved ejection fraction (HFpEF) (HCC) 12/26/2023   Community acquired pneumonia 12/24/2023   Acute respiratory failure with hypoxia (HCC) 12/23/2023   SVT (supraventricular tachycardia) 08/10/2023   Atrial fibrillation (HCC) 07/03/2023   Atrial fibrillation with RVR (HCC) 07/02/2023   Fall 06/15/2023   Non-compliance with renal dialysis 05/02/2023   Obesity (BMI 30-39.9) 04/30/2023   Volume overload 04/29/2023   Renal osteodystrophy 11/25/2022   Unsteady gait 10/05/2022   Steal syndrome of dialysis vascular access 05/28/2022   Hydronephrosis, left 05/04/2022   Leukocytosis 05/04/2022   Chest pain 01/13/2022   Diabetic retinopathy associated with diabetes mellitus due to underlying condition (HCC) 01/13/2022   ESRD on hemodialysis (HCC) 01/13/2022   Deafness in right ear 08/11/2021   Open wound 01/25/2021   History of colon polyps 10/15/2020   Acute cholecystitis without calculus 09/09/2020   Type 2 diabetes mellitus, with long-term current use  of insulin  (HCC) 09/09/2020   Cryptogenic stroke (HCC) 07/13/2020   History of loop recorder 07/13/2020   Elevated troponin 04/03/2020   Acquired trigger finger 06/08/2019   Lymphedema 06/08/2019   Anemia 04/17/2019   Swelling of both lower extremities 01/10/2019   Facial droop 04/09/2018   Daytime somnolence 03/30/2018   Carotid artery disease 02/03/2018   Intracranial vascular stenosis 09/12/2017   Cough 01/20/2017   Bell's palsy 11/10/2016   History of CVA  (cerebrovascular accident) 11/10/2016   Benign localized hyperplasia of prostate with urinary obstruction 10/27/2016   History of nephrolithiasis 10/27/2016   TIA (transient ischemic attack) 10/20/2016   Near syncope 06/23/2016   Organic impotence 10/01/2015   Neuropathy 08/06/2015   Health care maintenance 08/06/2015   Hypertension 08/06/2015   Hypothyroidism 10/10/2013   Microalbuminuria 10/10/2013   Hyperlipidemia 10/10/2013   B12 deficiency 10/10/2013   Environmental allergies 07/04/2013    ONSET DATE: 3 years ago  REFERRING DIAG: unsteady gait  THERAPY DIAG:  Muscle weakness (generalized)  Unsteadiness on feet  Other abnormalities of gait and mobility  Difficulty in walking, not elsewhere classified  Abnormality of gait and mobility  Other lack of coordination  Rationale for Evaluation and Treatment: Rehabilitation  SUBJECTIVE:                                                                                                                                                                                             SUBJECTIVE STATEMENT:   Pt reports he was a little inactive this week due to dialysis.  Pt otherwise has not had any complications or falls.  Pt accompanied by: self  PERTINENT HISTORY: Patient returning to PT s/p hospitalization for pneumonia, , SVT, ESRD on hypodialysis. PMH DM, HTN, Anemia, Bells palsy, Hypothyroidism, Kidney stones, stroke and pseudotumour cerebri. Macular degeneration with impaired visual acuity   PAIN:  Are you having pain? Some L foot numbness/neuropathy  PRECAUTIONS: Fall  RED FLAGS: None   WEIGHT BEARING RESTRICTIONS: No  FALLS: Has patient fallen in last 6 months? No  LIVING ENVIRONMENT: Lives with: lives alone Lives in: House/apartment Stairs: yes stairs in house Has following equipment at home: Single point cane  PLOF: Independent with basic ADLs  PATIENT GOALS: balance, strength  OBJECTIVE:  Note: Objective  measures were completed at Evaluation unless otherwise noted.  DIAGNOSTIC FINDINGS: IMPRESSION: Interval resolution of previously noted trace left parafalcine subdural hematoma. No acute intracranial process.  COGNITION: Overall cognitive status: Within functional limits for tasks assessed   SENSATION: WFL  COORDINATION: Heel slide: WFL RLE; slight decrease with LLE   MUSCLE TONE: WFL  POSTURE: rounded  shoulders, forward head, and posterior pelvic tilt  LOWER EXTREMITY MMT:    MMT Right Eval Left Eval  Hip flexion 4 4  Hip extension    Hip abduction 3 3  Hip adduction 3+ 3+  Hip internal rotation    Hip external rotation    Knee flexion 4- 4-  Knee extension 4 4  Ankle dorsiflexion 3 2+  Ankle plantarflexion 3+ 3  Ankle inversion    Ankle eversion    (Blank rows = not tested)  BED MOBILITY:  Not tested  TRANSFERS: Sit to stand: CGA  Assistive device utilized: None     Stand to sit: CGA  Assistive device utilized: None     Chair to chair: CGA  Assistive device utilized: None       RAMP:  Not tested  CURB:  Findings: needs UE support to step up  STAIRS: heavy BUE support step over step negotiation.  GAIT: Findings: Gait Characteristics: decreased stride length, Left foot flat, wide BOS, and poor foot clearance- Left, Distance walked: 60 ft, Assistive device utilized:None, Level of assistance: CGA, and Comments: increased L ankle rolling due to not wearing AFO  FUNCTIONAL TESTS:  5 times sit to stand: 30.53 with 4 posterior LOB 6 minute walk test: perform next session  Berg Balance Scale: 26  PATIENT SURVEYS:  LEFS give next session                                                                                                                               TREATMENT DATE: 05/26/24   TherAct:   The patient completed 10 minutes at level(s) 2-7 on the NuStep rolling hills program using both BUE/BLE reciprocal movements to promote strength, endurance,  and cardiorespiratory fitness. PT increased the resistance level and monitored the patient's response to the intervention throughout. The patient required min cueing for technique and supervision level of assistance.   Seated plantarflexion/dorsiflexion with B feet placed on half foam roller, flat side up, 2x20 each direction  Seated hip adductions with physioball, 3 sec holds, 2x10  Updated current HEP and gave visual and verbal cues for proper performance of the exercises  Neuro:  Ambulation in hallway with use of head turns for visual scanning and balance challenge, length of main hallway, x2 Ambulation in hallway with use of head nods for visual scanning and balance challenge, length of main hallway, x2, pt with increased difficulty performing this task   PATIENT EDUCATION: Education details: goals, POC, HEP Person educated: Patient Education method: Explanation, Demonstration, Tactile cues, Verbal cues, and Handouts Education comprehension: verbalized understanding, returned demonstration, verbal cues required, tactile cues required, and needs further education  HOME EXERCISE PROGRAM: Access Code: [email or web address] URL: [email or web address] Date: 05/26/2024 Prepared by: Sidra Simpers  Exercises - Leg Extension  - 1 x daily - 7 x weekly - 2 sets - 10 reps - 5 hold - Seated March  - 1 x daily - 7 x weekly -  2 sets - 10 reps - 5 hold - Seated Heel Raise  - 1 x daily - 7 x weekly - 2 sets - 10 reps - 5 hold - Sit to Stand  - 1 x daily - 7 x weekly - 3 sets - 10 reps - Mini Squat with Counter Support  - 1 x daily - 7 x weekly - 3 sets - 10 reps - Standing 4-Way Leg Reach with Counter Support  - 1 x daily - 7 x weekly - 3 sets - 10 reps  GOALS: Goals reviewed with patient? Yes  SHORT TERM GOALS: Target date: 01/26/2024  Patient will be independent in home exercise program to improve strength/mobility for better functional independence with ADLs.  Baseline: Goal status:  INITIAL  LONG TERM GOALS: Target date: 03/22/2024  Patient (< 1 years old) will complete five times sit to stand test in < 10 seconds indicating an increased LE strength and improved balance.  Baseline: 30.53 with 4 posterior LOB 03/29/24: 22.61 with 3 posterior LOB Goal status: PROGRESSING  2.   Patient will increase Berg Balance score by >45/56 to demonstrate decreased fall risk during functional activities.  Baseline: 5/5: 26  03/29/24: 32 Goal status: INITIAL  3.    Patient will increase six minute walk test distance to >1200 for progression to age norm community ambulator and improve gait ability  Baseline: perform next session 5/12: 710 ft  03/29/24: 770' Goal status: PROGRESSING  4.  Patient will score > 60/80 on LEFS for improved functional mobility and quality of life.  Baseline: perform next session 5/12: 43 03/29/24: 39/80 Goal status: INITIAL    ASSESSMENT:  CLINICAL IMPRESSION:   Pt continues to perform well and notes the NuStep to be a good workout due to the resistance added.  Pt overall is making better progress with the strength portion of the exercises, but is challenged when having the pt perform more dynamic tasks.  Pt will need to continue balance training in subsequent sessions.   Pt will continue to benefit from skilled therapy to address remaining deficits in order to improve overall QoL and return to PLOF.     OBJECTIVE IMPAIRMENTS: Abnormal gait, cardiopulmonary status limiting activity, decreased activity tolerance, decreased balance, decreased coordination, decreased endurance, decreased knowledge of use of DME, decreased mobility, difficulty walking, decreased ROM, decreased strength, dizziness, impaired perceived functional ability, impaired flexibility, impaired vision/preception, improper body mechanics, postural dysfunction, and obesity.   ACTIVITY LIMITATIONS: carrying, lifting, bending, sitting, standing, squatting, stairs, transfers, bed mobility,  toileting, dressing, reach over head, hygiene/grooming, locomotion level, and caring for others  PARTICIPATION LIMITATIONS: meal prep, cleaning, laundry, driving, shopping, community activity, occupation, and yard work  PERSONAL FACTORS: Age, Behavior pattern, Fitness, Past/current experiences, Time since onset of injury/illness/exacerbation, Transportation, and 3+ comorbidities: DM, HTN, Anemia, Bells palsy, Hypothyroidism, Kidney stones, stroke and pseudotumour cerebri. Macular degeneration with impaired visual acuity  are also affecting patient's functional outcome.   REHAB POTENTIAL: Good  CLINICAL DECISION MAKING: Evolving/moderate complexity  EVALUATION COMPLEXITY: Moderate  PLAN:  PT FREQUENCY: 2x/week  PT DURATION: 12 weeks  PLANNED INTERVENTIONS: 97164- PT Re-evaluation, 97750- Physical Performance Testing, 97110-Therapeutic exercises, 97530- Therapeutic activity, [id number]- Neuromuscular re-education, 97535- Self Care, [id number]- Manual therapy, [id number]- Gait training, [id number]- Orthotic Initial, [id number]- Orthotic/Prosthetic subsequent, [phone]- Canalith repositioning, [id number]- Splinting, H9716- Electrical stimulation (unattended), [id number]- Electrical stimulation (manual), [id number]- Vasopneumatic device, [id number]- Ultrasound, [id number]- Traction (mechanical), [id number]- Ionotophoresis 4mg /ml Dexamethasone , Patient/Family education, Balance training, Stair training, Taping, Dry Needling, Joint mobilization, Spinal  mobilization, Scar mobilization, Compression bandaging, Vestibular training, Visual/preceptual remediation/compensation, Cognitive remediation, DME instructions, Cryotherapy, Moist heat, and Biofeedback  PLAN FOR NEXT SESSION:  Korebalance balance, strength, L ankle strength, stairs, LE endurance   Fonda Simpers, PT, DPT Physical Therapist - [facility]  [facility]  05/26/24, 3:22 PM

## 2024-05-31 ENCOUNTER — Ambulatory Visit

## 2024-05-31 DIAGNOSIS — R262 Difficulty in walking, not elsewhere classified: Secondary | ICD-10-CM

## 2024-05-31 DIAGNOSIS — M6281 Muscle weakness (generalized): Secondary | ICD-10-CM | POA: Diagnosis not present

## 2024-05-31 DIAGNOSIS — R2689 Other abnormalities of gait and mobility: Secondary | ICD-10-CM

## 2024-05-31 DIAGNOSIS — R2681 Unsteadiness on feet: Secondary | ICD-10-CM

## 2024-05-31 DIAGNOSIS — R269 Unspecified abnormalities of gait and mobility: Secondary | ICD-10-CM

## 2024-05-31 DIAGNOSIS — R278 Other lack of coordination: Secondary | ICD-10-CM

## 2024-05-31 NOTE — Therapy (Signed)
 OUTPATIENT PHYSICAL THERAPY NEURO TREATMENT   Patient Name: Gregory Crane MRN: [id number] DOB:Sep 13, 1970, 53 y.o., male Today's Date: 05/31/2024   PCP: Glendia Shad  REFERRING PROVIDER: Glendia Shad  END OF SESSION:  PT End of Session - 05/31/24 1019     Visit Number 26    Number of Visits 39    Date for Recertification  06/21/24    Progress Note Due on Visit 20    PT Start Time 1020    PT Stop Time 1100    PT Time Calculation (min) 40 min    Equipment Utilized During Treatment Gait belt    Activity Tolerance Patient tolerated treatment well    Behavior During Therapy [facility] for tasks assessed/performed          Past Medical History:  Diagnosis Date   Allergy    Anemia    Bell's palsy    Diabetes mellitus without complication (HCC)    diet controlled   Fall 07/2023   Hypertension    Hypothyroidism    Kidney stones    Pseudotumor cerebri    Stroke [facility])    Past Surgical History:  Procedure Laterality Date   COLONOSCOPY WITH PROPOFOL  N/A 03/30/2020   Procedure: COLONOSCOPY WITH PROPOFOL ;  Surgeon: Maryruth Ole DASEN, MD;  Location: [facility] ENDOSCOPY;  Service: Endoscopy;  Laterality: N/A;   CORONARY STENT INTERVENTION N/A 03/17/2024   Procedure: CORONARY STENT INTERVENTION;  Surgeon: Swazil[place], Peter M, MD;  Location: [facility] INVASIVE CV LAB;  Service: Cardiovascular;  Laterality: N/A;   LEFT HEART CATH AND CORONARY ANGIOGRAPHY N/A 03/17/2024   Procedure: LEFT HEART CATH AND CORONARY ANGIOGRAPHY;  Surgeon: Swaziland, Peter M, MD;  Location: [facility] INVASIVE CV LAB;  Service: Cardiovascular;  Laterality: N/A;   LOOP RECORDER INSERTION N/A 01/27/2018   Procedure: LOOP RECORDER INSERTION;  Surgeon: Fernande Elspeth BROCKS, MD;  Location: [facility] INVASIVE CV LAB;  Service: Cardiovascular;  Laterality: N/A;   LUMBAR PUNCTURE     as child   NO PAST SURGERIES     REMOVAL OF A DIALYSIS CATHETER     TEE WITHOUT CARDIOVERSION N/A 01/07/2018   Procedure: TRANSESOPHAGEAL ECHOCARDIOGRAM (TEE);   Surgeon: Perla Evalene PARAS, MD;  Location: [facility] ORS;  Service: Cardiovascular;  Laterality: N/A;   Patient Active Problem List   Diagnosis Date Noted   Bradycardia 04/30/2024   CAD S/P percutaneous coronary angioplasty 03/17/2024   Non-ST elevation (NSTEMI) myocardial infarction (HCC) 03/16/2024   Hypervolemia 03/14/2024   Hypoxic respiratory failure (HCC) 03/14/2024   Demand ischemia (HCC) 03/14/2024   Left hip pain 01/30/2024   Acute on chronic heart failure with preserved ejection fraction (HFpEF) (HCC) 12/26/2023   Community acquired pneumonia 12/24/2023   Acute respiratory failure with hypoxia (HCC) 12/23/2023   SVT (supraventricular tachycardia) 08/10/2023   Atrial fibrillation (HCC) 07/03/2023   Atrial fibrillation with RVR (HCC) 07/02/2023   Fall 06/15/2023   Non-compliance with renal dialysis 05/02/2023   Obesity (BMI 30-39.9) 04/30/2023   Volume overload 04/29/2023   Renal osteodystrophy 11/25/2022   Unsteady gait 10/05/2022   Steal syndrome of dialysis vascular access 05/28/2022   Hydronephrosis, left 05/04/2022   Leukocytosis 05/04/2022   Chest pain 01/13/2022   Diabetic retinopathy associated with diabetes mellitus due to underlying condition (HCC) 01/13/2022   ESRD on hemodialysis (HCC) 01/13/2022   Deafness in right ear 08/11/2021   Open wound 01/25/2021   History of colon polyps 10/15/2020   Acute cholecystitis without calculus 09/09/2020   Type 2 diabetes mellitus, with long-term current  use of insulin  (HCC) 09/09/2020   Cryptogenic stroke (HCC) 07/13/2020   History of loop recorder 07/13/2020   Elevated troponin 04/03/2020   Acquired trigger finger 06/08/2019   Lymphedema 06/08/2019   Anemia 04/17/2019   Swelling of both lower extremities 01/10/2019   Facial droop 04/09/2018   Daytime somnolence 03/30/2018   Carotid artery disease 02/03/2018   Intracranial vascular stenosis 09/12/2017   Cough 01/20/2017   Bell's palsy 11/10/2016   History of CVA  (cerebrovascular accident) 11/10/2016   Benign localized hyperplasia of prostate with urinary obstruction 10/27/2016   History of nephrolithiasis 10/27/2016   TIA (transient ischemic attack) 10/20/2016   Near syncope 06/23/2016   Organic impotence 10/01/2015   Neuropathy 08/06/2015   Health care maintenance 08/06/2015   Hypertension 08/06/2015   Hypothyroidism 10/10/2013   Microalbuminuria 10/10/2013   Hyperlipidemia 10/10/2013   B12 deficiency 10/10/2013   Environmental allergies 07/04/2013    ONSET DATE: 3 years ago  REFERRING DIAG: unsteady gait  THERAPY DIAG:  Muscle weakness (generalized)  Unsteadiness on feet  Other abnormalities of gait and mobility  Difficulty in walking, not elsewhere classified  Abnormality of gait and mobility  Other lack of coordination  Rationale for Evaluation and Treatment: Rehabilitation  SUBJECTIVE:                                                                                                                                                                                             SUBJECTIVE STATEMENT:   Pt reports he is going to be seeing his grandchildren for the first time in almost a year.  Pt notes that his daughter is bringing them up here from [facility] and he's excited to see them.  Pt accompanied by: self  PERTINENT HISTORY: Patient returning to PT s/p hospitalization for pneumonia, , SVT, ESRD on hypodialysis. PMH DM, HTN, Anemia, Bells palsy, Hypothyroidism, Kidney stones, stroke and pseudotumour cerebri. Macular degeneration with impaired visual acuity   PAIN:  Are you having pain? Some L foot numbness/neuropathy  PRECAUTIONS: Fall  RED FLAGS: None   WEIGHT BEARING RESTRICTIONS: No  FALLS: Has patient fallen in last 6 months? No  LIVING ENVIRONMENT: Lives with: lives alone Lives in: House/apartment Stairs: yes stairs in house Has following equipment at home: Single point cane  PLOF: Independent with basic  ADLs  PATIENT GOALS: balance, strength  OBJECTIVE:  Note: Objective measures were completed at Evaluation unless otherwise noted.  DIAGNOSTIC FINDINGS: IMPRESSION: Interval resolution of previously noted trace left parafalcine subdural hematoma. No acute intracranial process.  COGNITION: Overall cognitive status: Within functional limits for tasks assessed   SENSATION: [facility]  COORDINATION: Heel slide: WFL RLE; slight decrease with LLE   MUSCLE TONE: WFL  POSTURE: rounded shoulders, forward head, and posterior pelvic tilt  LOWER EXTREMITY MMT:    MMT Right Eval Left Eval  Hip flexion 4 4  Hip extension    Hip abduction 3 3  Hip adduction 3+ 3+  Hip internal rotation    Hip external rotation    Knee flexion 4- 4-  Knee extension 4 4  Ankle dorsiflexion 3 2+  Ankle plantarflexion 3+ 3  Ankle inversion    Ankle eversion    (Blank rows = not tested)  BED MOBILITY:  Not tested  TRANSFERS: Sit to stand: CGA  Assistive device utilized: None     Stand to sit: CGA  Assistive device utilized: None     Chair to chair: CGA  Assistive device utilized: None       RAMP:  Not tested  CURB:  Findings: needs UE support to step up  STAIRS: heavy BUE support step over step negotiation.  GAIT: Findings: Gait Characteristics: decreased stride length, Left foot flat, wide BOS, and poor foot clearance- Left, Distance walked: 60 ft, Assistive device utilized:None, Level of assistance: CGA, and Comments: increased L ankle rolling due to not wearing AFO  FUNCTIONAL TESTS:  5 times sit to stand: 30.53 with 4 posterior LOB 6 minute walk test: perform next session  Berg Balance Scale: 26  PATIENT SURVEYS:  LEFS give next session                                                                                                                               TREATMENT DATE: 05/31/24   TherAct:   The patient completed 10 minutes at level(s) 2-7 on the NuStep rolling hills  program using both BUE/BLE reciprocal movements to promote strength, endurance, and cardiorespiratory fitness. PT increased the resistance level and monitored the patient's response to the intervention throughout. The patient required min cueing for technique and supervision level of assistance.    Neuro:  Ambulation in hallway with use of head turns for visual scanning and balance challenge, length of main hallway, x2 Ambulation in hallway with use of head nods for visual scanning and balance challenge, length of main hallway, x2, pt with increased difficulty performing this task   The Blaze Pod Focus setting was selected to refine precision and concentration, isolating specific muscle groups or movements to enhance overall coordination and targeted muscle engagement.   Activity Description: 3 pods on mirror above eyesight, 3 pods on floor, with pt tapping on the red lit pod Activity Setting:  Focus Number of Pods:  6 Cycles/Sets:  3 Duration (Time or Hit Count):  30 hit  Patient Stats  Reaction Time:  1 min 27 sec; 1 min 15 sec; 1 min 12 sec     PATIENT EDUCATION: Education details: goals, POC, HEP Person educated: Patient Education method: Explanation, Demonstration, Tactile cues, Verbal cues, and Handouts  Education comprehension: verbalized understanding, returned demonstration, verbal cues required, tactile cues required, and needs further education  HOME EXERCISE PROGRAM: Access Code: [email or web address] URL: [email or web address] Date: 05/26/2024 Prepared by: Sidra Simpers  Exercises - Leg Extension  - 1 x daily - 7 x weekly - 2 sets - 10 reps - 5 hold - Seated March  - 1 x daily - 7 x weekly - 2 sets - 10 reps - 5 hold - Seated Heel Raise  - 1 x daily - 7 x weekly - 2 sets - 10 reps - 5 hold - Sit to Stand  - 1 x daily - 7 x weekly - 3 sets - 10 reps - Mini Squat with Counter Support  - 1 x daily - 7 x weekly - 3 sets - 10 reps - Standing 4-Way Leg Reach with  Counter Support  - 1 x daily - 7 x weekly - 3 sets - 10 reps  GOALS: Goals reviewed with patient? Yes  SHORT TERM GOALS: Target date: 01/26/2024  Patient will be independent in home exercise program to improve strength/mobility for better functional independence with ADLs.  Baseline: Goal status: INITIAL  LONG TERM GOALS: Target date: 03/22/2024  Patient (< 42 years old) will complete five times sit to stand test in < 10 seconds indicating an increased LE strength and improved balance.  Baseline: 30.53 with 4 posterior LOB 03/29/24: 22.61 with 3 posterior LOB Goal status: PROGRESSING  2.   Patient will increase Berg Balance score by >45/56 to demonstrate decreased fall risk during functional activities.  Baseline: 5/5: 26  03/29/24: 32 Goal status: INITIAL  3.    Patient will increase six minute walk test distance to >1200 for progression to age norm community ambulator and improve gait ability  Baseline: perform next session 5/12: 710 ft  03/29/24: 770' Goal status: PROGRESSING  4.  Patient will score > 60/80 on LEFS for improved functional mobility and quality of life.  Baseline: perform next session 5/12: 43 03/29/24: 39/80 Goal status: INITIAL    ASSESSMENT:  CLINICAL IMPRESSION:   Pt responded well to the exercises and was able to respond well to the balance exercises.  Pt noted the time to have flown by with the differing items that he performed.  Pt would be challenged more by spreading the pods out more to promote lateral ambulation and to improve balance.   Pt will continue to benefit from skilled therapy to address remaining deficits in order to improve overall QoL and return to PLOF.      OBJECTIVE IMPAIRMENTS: Abnormal gait, cardiopulmonary status limiting activity, decreased activity tolerance, decreased balance, decreased coordination, decreased endurance, decreased knowledge of use of DME, decreased mobility, difficulty walking, decreased ROM, decreased strength,  dizziness, impaired perceived functional ability, impaired flexibility, impaired vision/preception, improper body mechanics, postural dysfunction, and obesity.   ACTIVITY LIMITATIONS: carrying, lifting, bending, sitting, standing, squatting, stairs, transfers, bed mobility, toileting, dressing, reach over head, hygiene/grooming, locomotion level, and caring for others  PARTICIPATION LIMITATIONS: meal prep, cleaning, laundry, driving, shopping, community activity, occupation, and yard work  PERSONAL FACTORS: Age, Behavior pattern, Fitness, Past/current experiences, Time since onset of injury/illness/exacerbation, Transportation, and 3+ comorbidities: DM, HTN, Anemia, Bells palsy, Hypothyroidism, Kidney stones, stroke and pseudotumour cerebri. Macular degeneration with impaired visual acuity  are also affecting patient's functional outcome.   REHAB POTENTIAL: Good  CLINICAL DECISION MAKING: Evolving/moderate complexity  EVALUATION COMPLEXITY: Moderate  PLAN:  PT FREQUENCY: 2x/week  PT DURATION: 12 weeks  PLANNED INTERVENTIONS: 02835- PT  Re-evaluation, 97750- Physical Performance Testing, 97110-Therapeutic exercises, 97530- Therapeutic activity, [id number]- Neuromuscular re-education, [phone]- Self Care, 02859- Manual therapy, [id number]- Gait training, [phone]- Orthotic Initial, [phone]- Orthotic/Prosthetic subsequent, [phone]- Canalith repositioning, [phone]- Splinting, H9716- Electrical stimulation (unattended), [phone]- Electrical stimulation (manual), [id number]- Vasopneumatic device, [id number]- Ultrasound, [id number]- Traction (mechanical), [id number]- Ionotophoresis 4mg /ml Dexamethasone , Patient/Family education, Balance training, Stair training, Taping, Dry Needling, Joint mobilization, Spinal mobilization, Scar mobilization, Compression bandaging, Vestibular training, Visual/preceptual remediation/compensation, Cognitive remediation, DME instructions, Cryotherapy, Moist heat, and Biofeedback  PLAN FOR NEXT SESSION:  Blaze  Pods, Korebalance balance, strength, L ankle strength, stairs, LE endurance   Fonda Simpers, PT, DPT Physical Therapist - [place]  [facility]  05/31/24, 11:04 AM

## 2024-06-02 ENCOUNTER — Ambulatory Visit

## 2024-06-07 ENCOUNTER — Ambulatory Visit

## 2024-06-07 DIAGNOSIS — R262 Difficulty in walking, not elsewhere classified: Secondary | ICD-10-CM

## 2024-06-07 DIAGNOSIS — R269 Unspecified abnormalities of gait and mobility: Secondary | ICD-10-CM

## 2024-06-07 DIAGNOSIS — R278 Other lack of coordination: Secondary | ICD-10-CM

## 2024-06-07 DIAGNOSIS — R2689 Other abnormalities of gait and mobility: Secondary | ICD-10-CM

## 2024-06-07 DIAGNOSIS — R2681 Unsteadiness on feet: Secondary | ICD-10-CM

## 2024-06-07 DIAGNOSIS — M6281 Muscle weakness (generalized): Secondary | ICD-10-CM

## 2024-06-07 NOTE — Therapy (Addendum)
 OUTPATIENT PHYSICAL THERAPY NEURO TREATMENT   Patient Name: Gregory Crane MRN: [id number] DOB:[birth year], 53 y.o., male Today's Date: 06/07/2024  PCP: Glendia Shad  REFERRING PROVIDER: Glendia Shad  END OF SESSION:  PT End of Session - 06/07/24 1026     Visit Number 27    Number of Visits 39    Date for Recertification  06/21/24    Progress Note Due on Visit 20    PT Start Time 1021    PT Stop Time 1100    PT Time Calculation (min) 39 min    Equipment Utilized During Treatment Gait belt    Activity Tolerance Patient tolerated treatment well    Behavior During Therapy WFL for tasks assessed/performed         Past Medical History:  Diagnosis Date   Allergy    Anemia    Bell's palsy    Diabetes mellitus without complication (HCC)    diet controlled   Fall 07/2023   Hypertension    Hypothyroidism    Kidney stones    Pseudotumor cerebri    Stroke [facility])    Past Surgical History:  Procedure Laterality Date   COLONOSCOPY WITH PROPOFOL  N/A 03/30/2020   Procedure: COLONOSCOPY WITH PROPOFOL ;  Surgeon: Maryruth Ole DASEN, MD;  Location: [facility] ENDOSCOPY;  Service: Endoscopy;  Laterality: N/A;   CORONARY STENT INTERVENTION N/A 03/17/2024   Procedure: CORONARY STENT INTERVENTION;  Surgeon: Swaziland, Peter M, MD;  Location: [facility] INVASIVE CV LAB;  Service: Cardiovascular;  Laterality: N/A;   LEFT HEART CATH AND CORONARY ANGIOGRAPHY N/A 03/17/2024   Procedure: LEFT HEART CATH AND CORONARY ANGIOGRAPHY;  Surgeon: Swaziland, Peter M, MD;  Location: [facility] INVASIVE CV LAB;  Service: Cardiovascular;  Laterality: N/A;   LOOP RECORDER INSERTION N/A 01/27/2018   Procedure: LOOP RECORDER INSERTION;  Surgeon: Fernande Elspeth BROCKS, MD;  Location: [facility] INVASIVE CV LAB;  Service: Cardiovascular;  Laterality: N/A;   LUMBAR PUNCTURE     as child   NO PAST SURGERIES     REMOVAL OF A DIALYSIS CATHETER     TEE WITHOUT CARDIOVERSION N/A 01/07/2018   Procedure: TRANSESOPHAGEAL ECHOCARDIOGRAM (TEE);   Surgeon: Perla Evalene PARAS, MD;  Location: [facility] ORS;  Service: Cardiovascular;  Laterality: N/A;   Patient Active Problem List   Diagnosis Date Noted   Bradycardia 04/30/2024   CAD S/P percutaneous coronary angioplasty 03/17/2024   Non-ST elevation (NSTEMI) myocardial infarction (HCC) 03/16/2024   Hypervolemia 03/14/2024   Hypoxic respiratory failure (HCC) 03/14/2024   Demand ischemia (HCC) 03/14/2024   Left hip pain 01/30/2024   Acute on chronic heart failure with preserved ejection fraction (HFpEF) (HCC) 12/26/2023   Community acquired pneumonia 12/24/2023   Acute respiratory failure with hypoxia (HCC) 12/23/2023   SVT (supraventricular tachycardia) 08/10/2023   Atrial fibrillation (HCC) 07/03/2023   Atrial fibrillation with RVR (HCC) 07/02/2023   Fall 06/15/2023   Non-compliance with renal dialysis 05/02/2023   Obesity (BMI 30-39.9) 04/30/2023   Volume overload 04/29/2023   Renal osteodystrophy 11/25/2022   Unsteady gait 10/05/2022   Steal syndrome of dialysis vascular access 05/28/2022   Hydronephrosis, left 05/04/2022   Leukocytosis 05/04/2022   Chest pain 01/13/2022   Diabetic retinopathy associated with diabetes mellitus due to underlying condition (HCC) 01/13/2022   ESRD on hemodialysis (HCC) 01/13/2022   Deafness in right ear 08/11/2021   Open wound 01/25/2021   History of colon polyps 10/15/2020   Acute cholecystitis without calculus 09/09/2020   Type 2 diabetes mellitus, with long-term current use of  insulin  (HCC) 09/09/2020   Cryptogenic stroke (HCC) 07/13/2020   History of loop recorder 07/13/2020   Elevated troponin 04/03/2020   Acquired trigger finger 06/08/2019   Lymphedema 06/08/2019   Anemia 04/17/2019   Swelling of both lower extremities 01/10/2019   Facial droop 04/09/2018   Daytime somnolence 03/30/2018   Carotid artery disease 02/03/2018   Intracranial vascular stenosis 09/12/2017   Cough 01/20/2017   Bell's palsy 11/10/2016   History of CVA  (cerebrovascular accident) 11/10/2016   Benign localized hyperplasia of prostate with urinary obstruction 10/27/2016   History of nephrolithiasis 10/27/2016   TIA (transient ischemic attack) 10/20/2016   Near syncope 06/23/2016   Organic impotence 10/01/2015   Neuropathy 08/06/2015   Health care maintenance 08/06/2015   Hypertension 08/06/2015   Hypothyroidism 10/10/2013   Microalbuminuria 10/10/2013   Hyperlipidemia 10/10/2013   B12 deficiency 10/10/2013   Environmental allergies 07/04/2013    ONSET DATE: 3 years ago  REFERRING DIAG: unsteady gait  THERAPY DIAG:  Muscle weakness (generalized)  Unsteadiness on feet  Other abnormalities of gait and mobility  Difficulty in walking, not elsewhere classified  Abnormality of gait and mobility  Other lack of coordination  Rationale for Evaluation and Treatment: Rehabilitation  SUBJECTIVE:                                                                                                                                                                                             SUBJECTIVE STATEMENT:   Pt reports that he has been a little off with his balance this morning, but is ready to begin therapy.  Pt notes he got new shoes.  Pt accompanied by: self  PERTINENT HISTORY: Patient returning to PT s/p hospitalization for pneumonia, , SVT, ESRD on hypodialysis. PMH DM, HTN, Anemia, Bells palsy, Hypothyroidism, Kidney stones, stroke and pseudotumour cerebri. Macular degeneration with impaired visual acuity   PAIN:  Are you having pain? Some L foot numbness/neuropathy  PRECAUTIONS: Fall  RED FLAGS: None   WEIGHT BEARING RESTRICTIONS: No  FALLS: Has patient fallen in last 6 months? No  LIVING ENVIRONMENT: Lives with: lives alone Lives in: House/apartment Stairs: yes stairs in house Has following equipment at home: Single point cane  PLOF: Independent with basic ADLs  PATIENT GOALS: balance, strength  OBJECTIVE:   Note: Objective measures were completed at Evaluation unless otherwise noted.  DIAGNOSTIC FINDINGS: IMPRESSION: Interval resolution of previously noted trace left parafalcine subdural hematoma. No acute intracranial process.  COGNITION: Overall cognitive status: Within functional limits for tasks assessed   SENSATION: WFL  COORDINATION: Heel slide: WFL RLE; slight decrease with LLE   MUSCLE TONE:  WFL  POSTURE: rounded shoulders, forward head, and posterior pelvic tilt  LOWER EXTREMITY MMT:    MMT Right Eval Left Eval  Hip flexion 4 4  Hip extension    Hip abduction 3 3  Hip adduction 3+ 3+  Hip internal rotation    Hip external rotation    Knee flexion 4- 4-  Knee extension 4 4  Ankle dorsiflexion 3 2+  Ankle plantarflexion 3+ 3  Ankle inversion    Ankle eversion    (Blank rows = not tested)  BED MOBILITY:  Not tested  TRANSFERS: Sit to stand: CGA  Assistive device utilized: None     Stand to sit: CGA  Assistive device utilized: None     Chair to chair: CGA  Assistive device utilized: None       RAMP:  Not tested  CURB:  Findings: needs UE support to step up  STAIRS: heavy BUE support step over step negotiation.  GAIT: Findings: Gait Characteristics: decreased stride length, Left foot flat, wide BOS, and poor foot clearance- Left, Distance walked: 60 ft, Assistive device utilized:None, Level of assistance: CGA, and Comments: increased L ankle rolling due to not wearing AFO  FUNCTIONAL TESTS:  5 times sit to stand: 30.53 with 4 posterior LOB 6 minute walk test: perform next session  Berg Balance Scale: 26  PATIENT SURVEYS:  LEFS give next session                                                                                                                               TREATMENT DATE: 06/07/24   Self-Care/Home Management:  Pt wanted to discuss shoe options due to the new shoes that he just bought and wore into the clinic today is causing his  ankle to supinate increasingly more than in the prior shoes he was wearing.  Pt was given a few shoe options that had stabilizing features that would assist in preventing the eversion/supination of the ankle.     TherAct: To improve functional movements patterns for everyday tasks  Patient ambulated 25 min indoor/outdoor today- including tile, concrete, carpet, horizontal head turns, on varied surfaces: concrete, brick, asphalt, mulch, grass, up ramp, inclines, declines- with 3 standing rest breaks - CGA for all indoor/outdoor mobility - 2 instance of instability when ambulating across grassy area with uneven terrain - Focusing on overall endurance and to improve ambulation across uneven terrain that pt would come in contact with in the community.      PATIENT EDUCATION: Education details: goals, POC, HEP Person educated: Patient Education method: Explanation, Demonstration, Tactile cues, Verbal cues, and Handouts Education comprehension: verbalized understanding, returned demonstration, verbal cues required, tactile cues required, and needs further education  HOME EXERCISE PROGRAM: Access Code: [email or web address] URL: [email or web address] Date: 05/26/2024 Prepared by: Sidra Simpers  Exercises - Leg Extension  - 1 x daily - 7 x weekly - 2 sets - 10 reps - 5 hold - Seated March  -  1 x daily - 7 x weekly - 2 sets - 10 reps - 5 hold - Seated Heel Raise  - 1 x daily - 7 x weekly - 2 sets - 10 reps - 5 hold - Sit to Stand  - 1 x daily - 7 x weekly - 3 sets - 10 reps - Mini Squat with Counter Support  - 1 x daily - 7 x weekly - 3 sets - 10 reps - Standing 4-Way Leg Reach with Counter Support  - 1 x daily - 7 x weekly - 3 sets - 10 reps  GOALS: Goals reviewed with patient? Yes  SHORT TERM GOALS: Target date: 01/26/2024  Patient will be independent in home exercise program to improve strength/mobility for better functional independence with ADLs.  Baseline: Goal status:  INITIAL  LONG TERM GOALS: Target date: 03/22/2024  Patient (< 56 years old) will complete five times sit to stand test in < 10 seconds indicating an increased LE strength and improved balance.  Baseline: 30.53 with 4 posterior LOB 03/29/24: 22.61 with 3 posterior LOB Goal status: PROGRESSING  2.   Patient will increase Berg Balance score by >45/56 to demonstrate decreased fall risk during functional activities.  Baseline: 5/5: 26  03/29/24: 32 Goal status: INITIAL  3.    Patient will increase six minute walk test distance to >1200 for progression to age norm community ambulator and improve gait ability  Baseline: perform next session 5/12: 710 ft  03/29/24: 770' Goal status: PROGRESSING  4.  Patient will score > 60/80 on LEFS for improved functional mobility and quality of life.  Baseline: perform next session 5/12: 43 03/29/24: 39/80 Goal status: INITIAL    ASSESSMENT:  CLINICAL IMPRESSION:   Pt tolerated the ambulation attempt well, however was noticed to have worsened eversion/supination of the ankle when ambulating.  This is most likely to the change in his footwear and was given education regarding footwear that could assist in preventing this.  Pt left with all questions answered and what to look for in a shoe.  Pt also improved in his endurance levels and was able to ambulate without a seated rest break when performing the exercise outside.   Pt will continue to benefit from skilled therapy to address remaining deficits in order to improve overall QoL and return to PLOF.       OBJECTIVE IMPAIRMENTS: Abnormal gait, cardiopulmonary status limiting activity, decreased activity tolerance, decreased balance, decreased coordination, decreased endurance, decreased knowledge of use of DME, decreased mobility, difficulty walking, decreased ROM, decreased strength, dizziness, impaired perceived functional ability, impaired flexibility, impaired vision/preception, improper body mechanics,  postural dysfunction, and obesity.   ACTIVITY LIMITATIONS: carrying, lifting, bending, sitting, standing, squatting, stairs, transfers, bed mobility, toileting, dressing, reach over head, hygiene/grooming, locomotion level, and caring for others  PARTICIPATION LIMITATIONS: meal prep, cleaning, laundry, driving, shopping, community activity, occupation, and yard work  PERSONAL FACTORS: Age, Behavior pattern, Fitness, Past/current experiences, Time since onset of injury/illness/exacerbation, Transportation, and 3+ comorbidities: DM, HTN, Anemia, Bells palsy, Hypothyroidism, Kidney stones, stroke and pseudotumour cerebri. Macular degeneration with impaired visual acuity  are also affecting patient's functional outcome.   REHAB POTENTIAL: Good  CLINICAL DECISION MAKING: Evolving/moderate complexity  EVALUATION COMPLEXITY: Moderate  PLAN:  PT FREQUENCY: 2x/week  PT DURATION: 12 weeks  PLANNED INTERVENTIONS: 97164- PT Re-evaluation, 97750- Physical Performance Testing, 97110-Therapeutic exercises, 97530- Therapeutic activity, [id number]- Neuromuscular re-education, 97535- Self Care, 02859- Manual therapy, [id number]- Gait training, [id number]- Orthotic Initial, [id number]- Orthotic/Prosthetic subsequent, [id number]- Canalith repositioning,  02239- Splinting, G0283- Electrical stimulation (unattended), [id number]- Electrical stimulation (manual), 02983- Vasopneumatic device, [id number]- Ultrasound, [id number]- Traction (mechanical), 02966- Ionotophoresis 4mg /ml Dexamethasone , Patient/Family education, Balance training, Stair training, Taping, Dry Needling, Joint mobilization, Spinal mobilization, Scar mobilization, Compression bandaging, Vestibular training, Visual/preceptual remediation/compensation, Cognitive remediation, DME instructions, Cryotherapy, Moist heat, and Biofeedback  PLAN FOR NEXT SESSION:  Blaze Pods, Korebalance balance, strength, L ankle strength, stairs, LE endurance   Fonda Simpers, PT, DPT Physical Therapist -  [place]  [facility]  06/07/24, 11:50 AM

## 2024-06-09 ENCOUNTER — Telehealth: Payer: Self-pay

## 2024-06-09 ENCOUNTER — Ambulatory Visit

## 2024-06-09 DIAGNOSIS — M6281 Muscle weakness (generalized): Secondary | ICD-10-CM | POA: Diagnosis not present

## 2024-06-09 DIAGNOSIS — R2681 Unsteadiness on feet: Secondary | ICD-10-CM

## 2024-06-09 DIAGNOSIS — R278 Other lack of coordination: Secondary | ICD-10-CM

## 2024-06-09 DIAGNOSIS — R262 Difficulty in walking, not elsewhere classified: Secondary | ICD-10-CM

## 2024-06-09 DIAGNOSIS — R269 Unspecified abnormalities of gait and mobility: Secondary | ICD-10-CM

## 2024-06-09 DIAGNOSIS — R2689 Other abnormalities of gait and mobility: Secondary | ICD-10-CM

## 2024-06-09 NOTE — Telephone Encounter (Signed)
 Copied from CRM [id number]. Topic: General - Other >> Jun 09, 2024 10:16 AM Carlyon D wrote: Reason for CRM: Pt calling stating he has been [place] to go to jury duty, pt needs something from the Dr. In regards to his medical condition and disability and him not being able to drive, Please reach out to pt in regards to this.  Needs letter by 10/21

## 2024-06-09 NOTE — Therapy (Signed)
 OUTPATIENT PHYSICAL THERAPY NEURO TREATMENT   Patient Name: Gregory Crane MRN: [id number] DOB:05-30-1971, 53 y.o., male Today's Date: 06/09/2024  PCP: Glendia Shad  REFERRING PROVIDER: Glendia Shad  END OF SESSION:  PT End of Session - 06/09/24 1112     Visit Number 28    Number of Visits 39    Date for Recertification  06/21/24    Progress Note Due on Visit 20    PT Start Time 1108    PT Stop Time 1147    PT Time Calculation (min) 39 min    Equipment Utilized During Treatment Gait belt    Activity Tolerance Patient tolerated treatment well    Behavior During Therapy [facility] for tasks assessed/performed          Past Medical History:  Diagnosis Date   Allergy    Anemia    Bell's palsy    Diabetes mellitus without complication (HCC)    diet controlled   Fall 07/2023   Hypertension    Hypothyroidism    Kidney stones    Pseudotumor cerebri    Stroke [facility])    Past Surgical History:  Procedure Laterality Date   COLONOSCOPY WITH PROPOFOL  N/A 03/30/2020   Procedure: COLONOSCOPY WITH PROPOFOL ;  Surgeon: Maryruth Ole DASEN, MD;  Location: [facility] ENDOSCOPY;  Service: Endoscopy;  Laterality: N/A;   CORONARY STENT INTERVENTION N/A 03/17/2024   Procedure: CORONARY STENT INTERVENTION;  Surgeon: Swaziland, Peter M, MD;  Location[facility] INVASIVE CV LAB;  Service: Cardiovascular;  Laterality: N/A;   LEFT HEART CATH AND CORONARY ANGIOGRAPHY N/A 03/17/2024   Procedure: LEFT HEART CATH AND CORONARY ANGIOGRAPHY;  Surgeon: Swaziland, Peter M, MD;  Location: [facility] INVASIVE CV LAB;  Service: Cardiovascular;  Laterality: N/A;   LOOP RECORDER INSERTION N/A 01/27/2018   Procedure: LOOP RECORDER INSERTION;  Surgeon: Fernande Elspeth BROCKS, MD;  Location: [facility] INVASIVE CV LAB;  Service: Cardiovascular;  Laterality: N/A;   LUMBAR PUNCTURE     as child   NO PAST SURGERIES     REMOVAL OF A DIALYSIS CATHETER     TEE WITHOUT CARDIOVERSION N/A 01/07/2018   Procedure: TRANSESOPHAGEAL ECHOCARDIOGRAM (TEE);   Surgeon: Perla Evalene PARAS, MD;  Location: [facility] ORS;  Service: Cardiovascular;  Laterality: N/A;   Patient Active Problem List   Diagnosis Date Noted   Bradycardia 04/30/2024   CAD S/P percutaneous coronary angioplasty 03/17/2024   Non-ST elevation (NSTEMI) myocardial infarction (HCC) 03/16/2024   Hypervolemia 03/14/2024   Hypoxic respiratory failure (HCC) 03/14/2024   Demand ischemia (HCC) 03/14/2024   Left hip pain 01/30/2024   Acute on chronic heart failure with preserved ejection fraction (HFpEF) (HCC) 12/26/2023   Community acquired pneumonia 12/24/2023   Acute respiratory failure with hypoxia (HCC) 12/23/2023   SVT (supraventricular tachycardia) 08/10/2023   Atrial fibrillation (HCC) 07/03/2023   Atrial fibrillation with RVR (HCC) 07/02/2023   Fall 06/15/2023   Non-compliance with renal dialysis 05/02/2023   Obesity (BMI 30-39.9) 04/30/2023   Volume overload 04/29/2023   Renal osteodystrophy 11/25/2022   Unsteady gait 10/05/2022   Steal syndrome of dialysis vascular access 05/28/2022   Hydronephrosis, left 05/04/2022   Leukocytosis 05/04/2022   Chest pain 01/13/2022   Diabetic retinopathy associated with diabetes mellitus due to underlying condition (HCC) 01/13/2022   ESRD on hemodialysis (HCC) 01/13/2022   Deafness in right ear 08/11/2021   Open wound 01/25/2021   History of colon polyps 10/15/2020   Acute cholecystitis without calculus 09/09/2020   Type 2 diabetes mellitus, with long-term current use  of insulin  (HCC) 09/09/2020   Cryptogenic stroke (HCC) 07/13/2020   History of loop recorder 07/13/2020   Elevated troponin 04/03/2020   Acquired trigger finger 06/08/2019   Lymphedema 06/08/2019   Anemia 04/17/2019   Swelling of both lower extremities 01/10/2019   Facial droop 04/09/2018   Daytime somnolence 03/30/2018   Carotid artery disease 02/03/2018   Intracranial vascular stenosis 09/12/2017   Cough 01/20/2017   Bell's palsy 11/10/2016   History of CVA  (cerebrovascular accident) 11/10/2016   Benign localized hyperplasia of prostate with urinary obstruction 10/27/2016   History of nephrolithiasis 10/27/2016   TIA (transient ischemic attack) 10/20/2016   Near syncope 06/23/2016   Organic impotence 10/01/2015   Neuropathy 08/06/2015   Health care maintenance 08/06/2015   Hypertension 08/06/2015   Hypothyroidism 10/10/2013   Microalbuminuria 10/10/2013   Hyperlipidemia 10/10/2013   B12 deficiency 10/10/2013   Environmental allergies 07/04/2013    ONSET DATE: 3 years ago  REFERRING DIAG: unsteady gait  THERAPY DIAG:  Muscle weakness (generalized)  Unsteadiness on feet  Other abnormalities of gait and mobility  Difficulty in walking, not elsewhere classified  Abnormality of gait and mobility  Other lack of coordination  Rationale for Evaluation and Treatment: Rehabilitation  SUBJECTIVE:                                                                                                                                                                                             SUBJECTIVE STATEMENT:   Pt reports he fell two days ago when he went to the cemetary to replace the flowers on his wife's grave.  Pt notes he fell on his back and had to crawl to a tombstone to assist with getting up.  Pt noted that he is a little sore in his back and hips.  Pt does have some scrapes on the elbows from climbing on the tombstones, not the actual fall.  Pt accompanied by: self  PERTINENT HISTORY: Patient returning to PT s/p hospitalization for pneumonia, , SVT, ESRD on hypodialysis. PMH DM, HTN, Anemia, Bells palsy, Hypothyroidism, Kidney stones, stroke and pseudotumour cerebri. Macular degeneration with impaired visual acuity   PAIN:  Are you having pain? Some L foot numbness/neuropathy  PRECAUTIONS: Fall  RED FLAGS: None   WEIGHT BEARING RESTRICTIONS: No  FALLS: Has patient fallen in last 6 months? No  LIVING ENVIRONMENT: Lives  with: lives alone Lives in: House/apartment Stairs: yes stairs in house Has following equipment at home: Single point cane  PLOF: Independent with basic ADLs  PATIENT GOALS: balance, strength  OBJECTIVE:  Note: Objective measures were completed at Evaluation unless otherwise noted.  DIAGNOSTIC FINDINGS: IMPRESSION: Interval resolution of previously noted trace left parafalcine subdural hematoma. No acute intracranial process.  COGNITION: Overall cognitive status: Within functional limits for tasks assessed   SENSATION: WFL  COORDINATION: Heel slide: WFL RLE; slight decrease with LLE   MUSCLE TONE: WFL  POSTURE: rounded shoulders, forward head, and posterior pelvic tilt  LOWER EXTREMITY MMT:    MMT Right Eval Left Eval  Hip flexion 4 4  Hip extension    Hip abduction 3 3  Hip adduction 3+ 3+  Hip internal rotation    Hip external rotation    Knee flexion 4- 4-  Knee extension 4 4  Ankle dorsiflexion 3 2+  Ankle plantarflexion 3+ 3  Ankle inversion    Ankle eversion    (Blank rows = not tested)  BED MOBILITY:  Not tested  TRANSFERS: Sit to stand: CGA  Assistive device utilized: None     Stand to sit: CGA  Assistive device utilized: None     Chair to chair: CGA  Assistive device utilized: None       RAMP:  Not tested  CURB:  Findings: needs UE support to step up  STAIRS: heavy BUE support step over step negotiation.  GAIT: Findings: Gait Characteristics: decreased stride length, Left foot flat, wide BOS, and poor foot clearance- Left, Distance walked: 60 ft, Assistive device utilized:None, Level of assistance: CGA, and Comments: increased L ankle rolling due to not wearing AFO  FUNCTIONAL TESTS:  5 times sit to stand: 30.53 with 4 posterior LOB 6 minute walk test: perform next session  Berg Balance Scale: 26  PATIENT SURVEYS:  LEFS give next session                                                                                                                                TREATMENT DATE: 06/09/24   TherAct: To improve functional movements patterns for everyday tasks  Patient ambulated 25 min indoor/outdoor today- including tile, concrete, carpet, horizontal head turns, on varied surfaces: concrete, brick, asphalt, mulch, grass, up ramp, inclines, declines- with 3 standing rest breaks - CGA for all indoor/outdoor mobility - 2 instance of instability when ambulating across grassy area with uneven terrain - Focusing on overall endurance and to improve ambulation across uneven terrain that pt would come in contact with in the community.  All supine exercises performed with MHP applied to the low back and the neck/shoulder area:  Hooklying LTR for improved hip and low back ROM within pain tolerable ranges, x10 each side  Hooklying glute bridges, with focus on eccentric lowering 2x10  Hooklying hip adduction into physioball, 3 sec holds, 2x15  Hooklying hip abduction with BlueTB, 2x10 each LE (one leg staying stationary)      PATIENT EDUCATION: Education details: goals, POC, HEP Person educated: Patient Education method: Explanation, Demonstration, Tactile cues, Verbal cues, and Handouts Education comprehension: verbalized understanding, returned demonstration, verbal cues required, tactile cues required, and  needs further education  HOME EXERCISE PROGRAM: Access Code: [email or web address] URL: [email or web address] Date: 05/26/2024 Prepared by: Sidra Simpers  Exercises - Leg Extension  - 1 x daily - 7 x weekly - 2 sets - 10 reps - 5 hold - Seated March  - 1 x daily - 7 x weekly - 2 sets - 10 reps - 5 hold - Seated Heel Raise  - 1 x daily - 7 x weekly - 2 sets - 10 reps - 5 hold - Sit to Stand  - 1 x daily - 7 x weekly - 3 sets - 10 reps - Mini Squat with Counter Support  - 1 x daily - 7 x weekly - 3 sets - 10 reps - Standing 4-Way Leg Reach with Counter Support  - 1 x daily - 7 x weekly - 3 sets - 10  reps  GOALS: Goals reviewed with patient? Yes  SHORT TERM GOALS: Target date: 01/26/2024  Patient will be independent in home exercise program to improve strength/mobility for better functional independence with ADLs.  Baseline: Goal status: INITIAL  LONG TERM GOALS: Target date: 03/22/2024  Patient (< 41 years old) will complete five times sit to stand test in < 10 seconds indicating an increased LE strength and improved balance.  Baseline: 30.53 with 4 posterior LOB 03/29/24: 22.61 with 3 posterior LOB Goal status: PROGRESSING  2.   Patient will increase Berg Balance score by >45/56 to demonstrate decreased fall risk during functional activities.  Baseline: 5/5: 26  03/29/24: 32 Goal status: INITIAL  3.    Patient will increase six minute walk test distance to >1200 for progression to age norm community ambulator and improve gait ability  Baseline: perform next session 5/12: 710 ft  03/29/24: 770' Goal status: PROGRESSING  4.  Patient will score > 60/80 on LEFS for improved functional mobility and quality of life.  Baseline: perform next session 5/12: 43 03/29/24: 39/80 Goal status: INITIAL    ASSESSMENT:  CLINICAL IMPRESSION:   Pt sore today upon arrival, so session was limited to bed-level exercises to improve pain and soreness that he was experiencing.  Pt notes that the heat was relieving, however still sore at the end of the session.  Pt encouraged to continue to mobilize safely at home and to continue with the HEP given.  Will plan to update HEP and continue to monitor pt over the next few visits.   Pt will continue to benefit from skilled therapy to address remaining deficits in order to improve overall QoL and return to PLOF.        OBJECTIVE IMPAIRMENTS: Abnormal gait, cardiopulmonary status limiting activity, decreased activity tolerance, decreased balance, decreased coordination, decreased endurance, decreased knowledge of use of DME, decreased mobility, difficulty  walking, decreased ROM, decreased strength, dizziness, impaired perceived functional ability, impaired flexibility, impaired vision/preception, improper body mechanics, postural dysfunction, and obesity.   ACTIVITY LIMITATIONS: carrying, lifting, bending, sitting, standing, squatting, stairs, transfers, bed mobility, toileting, dressing, reach over head, hygiene/grooming, locomotion level, and caring for others  PARTICIPATION LIMITATIONS: meal prep, cleaning, laundry, driving, shopping, community activity, occupation, and yard work  PERSONAL FACTORS: Age, Behavior pattern, Fitness, Past/current experiences, Time since onset of injury/illness/exacerbation, Transportation, and 3+ comorbidities: DM, HTN, Anemia, Bells palsy, Hypothyroidism, Kidney stones, stroke and pseudotumour cerebri. Macular degeneration with impaired visual acuity  are also affecting patient's functional outcome.   REHAB POTENTIAL: Good  CLINICAL DECISION MAKING: Evolving/moderate complexity  EVALUATION COMPLEXITY: Moderate  PLAN:  PT FREQUENCY: 2x/week  PT  DURATION: 12 weeks  PLANNED INTERVENTIONS: 97164- PT Re-evaluation, 97750- Physical Performance Testing, 97110-Therapeutic exercises, 97530- Therapeutic activity, [id number]- Neuromuscular re-education, 97535- Self Care, [id number]- Manual therapy, [id number]- Gait training, [phone]- Orthotic Initial, [phone]- Orthotic/Prosthetic subsequent, [phone]- Canalith repositioning, [phone]- Splinting, H9716- Electrical stimulation (unattended), [phone]- Electrical stimulation (manual), [id number]- Vasopneumatic device, [id number]- Ultrasound, [id number]- Traction (mechanical), [id number]- Ionotophoresis 4mg /ml Dexamethasone , Patient/Family education, Balance training, Stair training, Taping, Dry Needling, Joint mobilization, Spinal mobilization, Scar mobilization, Compression bandaging, Vestibular training, Visual/preceptual remediation/compensation, Cognitive remediation, DME instructions, Cryotherapy, Moist heat, and  Biofeedback  PLAN FOR NEXT SESSION:  Blaze Pods, Korebalance balance, strength, L ankle strength, stairs, LE endurance   Fonda Simpers, PT, DPT Physical Therapist - [place]  [facility]  06/09/24, 1:16 PM

## 2024-06-09 NOTE — Telephone Encounter (Unsigned)
 Copied from CRM [id number]. Topic: General - Other >> Jun 09, 2024 10:16 AM Carlyon D wrote: Reason for CRM: Pt calling stating he has been [place] to go to jury duty, pt needs something from the Dr. In regards to his medical condition and disability and him not being able to drive, Please reach out to pt in regards to this.  Needs letter by 10/21

## 2024-06-10 NOTE — Telephone Encounter (Signed)
Letter signed and placed in box.   

## 2024-06-12 LAB — OPHTHALMOLOGY REPORT-SCANNED

## 2024-06-14 ENCOUNTER — Ambulatory Visit

## 2024-06-14 DIAGNOSIS — M6281 Muscle weakness (generalized): Secondary | ICD-10-CM

## 2024-06-14 DIAGNOSIS — R2689 Other abnormalities of gait and mobility: Secondary | ICD-10-CM

## 2024-06-14 DIAGNOSIS — R2681 Unsteadiness on feet: Secondary | ICD-10-CM

## 2024-06-14 DIAGNOSIS — R269 Unspecified abnormalities of gait and mobility: Secondary | ICD-10-CM

## 2024-06-14 DIAGNOSIS — R262 Difficulty in walking, not elsewhere classified: Secondary | ICD-10-CM

## 2024-06-14 DIAGNOSIS — R278 Other lack of coordination: Secondary | ICD-10-CM

## 2024-06-14 NOTE — Therapy (Signed)
 OUTPATIENT PHYSICAL THERAPY NEURO TREATMENT   Patient Name: Gregory Crane MRN: [id number] DOB:11-Sep-1970, 53 y.o., male Today's Date: 06/14/2024  PCP: Glendia Shad  REFERRING PROVIDER: Glendia Shad  END OF SESSION:  PT End of Session - 06/14/24 1151     Visit Number 29    Number of Visits 39    Date for Recertification  06/21/24    Progress Note Due on Visit 20    PT Start Time 1151    PT Stop Time 1230    PT Time Calculation (min) 39 min    Equipment Utilized During Treatment Gait belt    Activity Tolerance Patient tolerated treatment well    Behavior During Therapy [facility] for tasks assessed/performed           Past Medical History:  Diagnosis Date   Allergy    Anemia    Bell's palsy    Diabetes mellitus without complication (HCC)    diet controlled   Fall 07/2023   Hypertension    Hypothyroidism    Kidney stones    Pseudotumor cerebri    Stroke [facility])    Past Surgical History:  Procedure Laterality Date   COLONOSCOPY WITH PROPOFOL  N/A 03/30/2020   Procedure: COLONOSCOPY WITH PROPOFOL ;  Surgeon: Maryruth Ole DASEN, MD;  Location: [facility] ENDOSCOPY;  Service: Endoscopy;  Laterality: N/A;   CORONARY STENT INTERVENTION N/A 03/17/2024   Procedure: CORONARY STENT INTERVENTION;  Surgeon: Swazil[place], Peter M, MD;  Location: [facility] INVASIVE CV LAB;  Service: Cardiovascular;  Laterality: N/A;   LEFT HEART CATH AND CORONARY ANGIOGRAPHY N/A 03/17/2024   Procedure: LEFT HEART CATH AND CORONARY ANGIOGRAPHY;  Surgeon: Swaziland, Peter M, MD;  Location: [facility] INVASIVE CV LAB;  Service: Cardiovascular;  Laterality: N/A;   LOOP RECORDER INSERTION N/A 01/27/2018   Procedure: LOOP RECORDER INSERTION;  Surgeon: Fernande Elspeth BROCKS, MD;  Location: [facility] INVASIVE CV LAB;  Service: Cardiovascular;  Laterality: N/A;   LUMBAR PUNCTURE     as child   NO PAST SURGERIES     REMOVAL OF A DIALYSIS CATHETER     TEE WITHOUT CARDIOVERSION N/A 01/07/2018   Procedure: TRANSESOPHAGEAL ECHOCARDIOGRAM (TEE);   Surgeon: Perla Evalene PARAS, MD;  Location: [facility] ORS;  Service: Cardiovascular;  Laterality: N/A;   Patient Active Problem List   Diagnosis Date Noted   Bradycardia 04/30/2024   CAD S/P percutaneous coronary angioplasty 03/17/2024   Non-ST elevation (NSTEMI) myocardial infarction (HCC) 03/16/2024   Hypervolemia 03/14/2024   Hypoxic respiratory failure (HCC) 03/14/2024   Demand ischemia (HCC) 03/14/2024   Left hip pain 01/30/2024   Acute on chronic heart failure with preserved ejection fraction (HFpEF) (HCC) 12/26/2023   Community acquired pneumonia 12/24/2023   Acute respiratory failure with hypoxia (HCC) 12/23/2023   SVT (supraventricular tachycardia) 08/10/2023   Atrial fibrillation (HCC) 07/03/2023   Atrial fibrillation with RVR (HCC) 07/02/2023   Fall 06/15/2023   Non-compliance with renal dialysis 05/02/2023   Obesity (BMI 30-39.9) 04/30/2023   Volume overload 04/29/2023   Renal osteodystrophy 11/25/2022   Unsteady gait 10/05/2022   Steal syndrome of dialysis vascular access 05/28/2022   Hydronephrosis, left 05/04/2022   Leukocytosis 05/04/2022   Chest pain 01/13/2022   Diabetic retinopathy associated with diabetes mellitus due to underlying condition (HCC) 01/13/2022   ESRD on hemodialysis (HCC) 01/13/2022   Deafness in right ear 08/11/2021   Open wound 01/25/2021   History of colon polyps 10/15/2020   Acute cholecystitis without calculus 09/09/2020   Type 2 diabetes mellitus, with long-term current  use of insulin  (HCC) 09/09/2020   Cryptogenic stroke (HCC) 07/13/2020   History of loop recorder 07/13/2020   Elevated troponin 04/03/2020   Acquired trigger finger 06/08/2019   Lymphedema 06/08/2019   Anemia 04/17/2019   Swelling of both lower extremities 01/10/2019   Facial droop 04/09/2018   Daytime somnolence 03/30/2018   Carotid artery disease 02/03/2018   Intracranial vascular stenosis 09/12/2017   Cough 01/20/2017   Bell's palsy 11/10/2016   History of CVA  (cerebrovascular accident) 11/10/2016   Benign localized hyperplasia of prostate with urinary obstruction 10/27/2016   History of nephrolithiasis 10/27/2016   TIA (transient ischemic attack) 10/20/2016   Near syncope 06/23/2016   Organic impotence 10/01/2015   Neuropathy 08/06/2015   Health care maintenance 08/06/2015   Hypertension 08/06/2015   Hypothyroidism 10/10/2013   Microalbuminuria 10/10/2013   Hyperlipidemia 10/10/2013   B12 deficiency 10/10/2013   Environmental allergies 07/04/2013    ONSET DATE: 3 years ago  REFERRING DIAG: unsteady gait  THERAPY DIAG:  Muscle weakness (generalized)  Unsteadiness on feet  Other abnormalities of gait and mobility  Difficulty in walking, not elsewhere classified  Abnormality of gait and mobility  Other lack of coordination  Rationale for Evaluation and Treatment: Rehabilitation  SUBJECTIVE:                                                                                                                                                                                             SUBJECTIVE STATEMENT:   Pt reports no new complaints upon arrival.  Pt notes that the heat from the last time he was here was great and he really appreciated it.    Pt accompanied by: self  PERTINENT HISTORY: Patient returning to PT s/p hospitalization for pneumonia, , SVT, ESRD on hypodialysis. PMH DM, HTN, Anemia, Bells palsy, Hypothyroidism, Kidney stones, stroke and pseudotumour cerebri. Macular degeneration with impaired visual acuity   PAIN:  Are you having pain? Some L foot numbness/neuropathy  PRECAUTIONS: Fall  RED FLAGS: None   WEIGHT BEARING RESTRICTIONS: No  FALLS: Has patient fallen in last 6 months? No  LIVING ENVIRONMENT: Lives with: lives alone Lives in: House/apartment Stairs: yes stairs in house Has following equipment at home: Single point cane  PLOF: Independent with basic ADLs  PATIENT GOALS: balance,  strength  OBJECTIVE:  Note: Objective measures were completed at Evaluation unless otherwise noted.  DIAGNOSTIC FINDINGS: IMPRESSION: Interval resolution of previously noted trace left parafalcine subdural hematoma. No acute intracranial process.  COGNITION: Overall cognitive status: Within functional limits for tasks assessed   SENSATION: WFL  COORDINATION: Heel slide: WFL RLE; slight decrease with LLE  MUSCLE TONE: WFL  POSTURE: rounded shoulders, forward head, and posterior pelvic tilt  LOWER EXTREMITY MMT:    MMT Right Eval Left Eval  Hip flexion 4 4  Hip extension    Hip abduction 3 3  Hip adduction 3+ 3+  Hip internal rotation    Hip external rotation    Knee flexion 4- 4-  Knee extension 4 4  Ankle dorsiflexion 3 2+  Ankle plantarflexion 3+ 3  Ankle inversion    Ankle eversion    (Blank rows = not tested)  BED MOBILITY:  Not tested  TRANSFERS: Sit to stand: CGA  Assistive device utilized: None     Stand to sit: CGA  Assistive device utilized: None     Chair to chair: CGA  Assistive device utilized: None       RAMP:  Not tested  CURB:  Findings: needs UE support to step up  STAIRS: heavy BUE support step over step negotiation.  GAIT: Findings: Gait Characteristics: decreased stride length, Left foot flat, wide BOS, and poor foot clearance- Left, Distance walked: 60 ft, Assistive device utilized:None, Level of assistance: CGA, and Comments: increased L ankle rolling due to not wearing AFO  FUNCTIONAL TESTS:  5 times sit to stand: 30.53 with 4 posterior LOB 6 minute walk test: perform next session  Berg Balance Scale: 26  PATIENT SURVEYS:  LEFS give next session                                                                                                                               TREATMENT DATE: 06/14/24   TherAct: To improve functional movements patterns for everyday tasks  The patient completed 10 minutes at level(s) 3-8 on the  [facility] using both BUE/BLE reciprocal movements to promote strength, endurance, and cardiorespiratory fitness. PT increased the resistance level and monitored the patient's response to the intervention throughout. The patient required min cueing for technique and supervision level of assistance.   STS x10 with verbal cues for reduced speed to prevent backwards unsteadiness   Neuro:  Forward step over 2 hurdles without UE assistance, 2x10  Lateral step over 2 hurdles, 2x10 each direction  Static rhomberg stance, 30 sec bouts x2  Ambulation in hallway with use of head turns for visual scanning and balance challenge, length of main hallway, x2 Ambulation in hallway with use of head nods for visual scanning and balance challenge, length of main hallway, x2, pt with increased difficulty performing this task     PATIENT EDUCATION: Education details: goals, POC, HEP Person educated: Patient Education method: Explanation, Demonstration, Tactile cues, Verbal cues, and Handouts Education comprehension: verbalized understanding, returned demonstration, verbal cues required, tactile cues required, and needs further education  HOME EXERCISE PROGRAM: Access Code: [email or web address] URL: [email or web address] Date: 05/26/2024 Prepared by: Sidra Simpers  Exercises - Leg Extension  - 1 x daily - 7 x weekly - 2 sets - 10 reps - 5 hold -  Seated March  - 1 x daily - 7 x weekly - 2 sets - 10 reps - 5 hold - Seated Heel Raise  - 1 x daily - 7 x weekly - 2 sets - 10 reps - 5 hold - Sit to Stand  - 1 x daily - 7 x weekly - 3 sets - 10 reps - Mini Squat with Counter Support  - 1 x daily - 7 x weekly - 3 sets - 10 reps - Standing 4-Way Leg Reach with Counter Support  - 1 x daily - 7 x weekly - 3 sets - 10 reps  GOALS: Goals reviewed with patient? Yes  SHORT TERM GOALS: Target date: 01/26/2024  Patient will be independent in home exercise program to improve strength/mobility for better  functional independence with ADLs.  Baseline: Goal status: INITIAL  LONG TERM GOALS: Target date: 03/22/2024  Patient (< 31 years old) will complete five times sit to stand test in < 10 seconds indicating an increased LE strength and improved balance.  Baseline: 30.53 with 4 posterior LOB 03/29/24: 22.61 with 3 posterior LOB Goal status: PROGRESSING  2.   Patient will increase Berg Balance score by >45/56 to demonstrate decreased fall risk during functional activities.  Baseline: 5/5: 26  03/29/24: 32 Goal status: INITIAL  3.    Patient will increase six minute walk test distance to >1200 for progression to age norm community ambulator and improve gait ability  Baseline: perform next session 5/12: 710 ft  03/29/24: 770' Goal status: PROGRESSING  4.  Patient will score > 60/80 on LEFS for improved functional mobility and quality of life.  Baseline: perform next session 5/12: 43 03/29/24: 39/80 Goal status: INITIAL    ASSESSMENT:  CLINICAL IMPRESSION:   Pt continues to respond well and put forth great effort throughout the session.  Pt continues to demonstrate strength and balance deficits that continues to be addressed by endurance level training and ambulation attempts with varying factors like head turns and changes in gait speed.   Pt will continue to benefit from skilled therapy to address remaining deficits in order to improve overall QoL and return to PLOF.         OBJECTIVE IMPAIRMENTS: Abnormal gait, cardiopulmonary status limiting activity, decreased activity tolerance, decreased balance, decreased coordination, decreased endurance, decreased knowledge of use of DME, decreased mobility, difficulty walking, decreased ROM, decreased strength, dizziness, impaired perceived functional ability, impaired flexibility, impaired vision/preception, improper body mechanics, postural dysfunction, and obesity.   ACTIVITY LIMITATIONS: carrying, lifting, bending, sitting, standing,  squatting, stairs, transfers, bed mobility, toileting, dressing, reach over head, hygiene/grooming, locomotion level, and caring for others  PARTICIPATION LIMITATIONS: meal prep, cleaning, laundry, driving, shopping, community activity, occupation, and yard work  PERSONAL FACTORS: Age, Behavior pattern, Fitness, Past/current experiences, Time since onset of injury/illness/exacerbation, Transportation, and 3+ comorbidities: DM, HTN, Anemia, Bells palsy, Hypothyroidism, Kidney stones, stroke and pseudotumour cerebri. Macular degeneration with impaired visual acuity  are also affecting patient's functional outcome.   REHAB POTENTIAL: Good  CLINICAL DECISION MAKING: Evolving/moderate complexity  EVALUATION COMPLEXITY: Moderate  PLAN:  PT FREQUENCY: 2x/week  PT DURATION: 12 weeks  PLANNED INTERVENTIONS: 97164- PT Re-evaluation, 97750- Physical Performance Testing, 97110-Therapeutic exercises, 97530- Therapeutic activity, [id number]- Neuromuscular re-education, 97535- Self Care, [id number]- Manual therapy, [id number]- Gait training, [id number]- Orthotic Initial, [id number]- Orthotic/Prosthetic subsequent, [phone]- Canalith repositioning, [id number]- Splinting, [id number]- Electrical stimulation (unattended), [id number]- Electrical stimulation (manual), [id number]- Vasopneumatic device, [id number]- Ultrasound, [id number]- Traction (mechanical), [id number]- Ionotophoresis 4mg /ml Dexamethasone , Patient/Family education, Balance training, Stair training,  Taping, Dry Needling, Joint mobilization, Spinal mobilization, Scar mobilization, Compression bandaging, Vestibular training, Visual/preceptual remediation/compensation, Cognitive remediation, DME instructions, Cryotherapy, Moist heat, and Biofeedback  PLAN FOR NEXT SESSION:   Blaze Pods, Korebalance balance, strength, L ankle strength, stairs, LE endurance   Fonda Simpers, PT, DPT Physical Therapist - [facility]  [facility]  06/14/24, 6:16 PM

## 2024-06-16 ENCOUNTER — Ambulatory Visit

## 2024-06-21 ENCOUNTER — Ambulatory Visit

## 2024-06-21 DIAGNOSIS — R262 Difficulty in walking, not elsewhere classified: Secondary | ICD-10-CM

## 2024-06-21 DIAGNOSIS — R278 Other lack of coordination: Secondary | ICD-10-CM

## 2024-06-21 DIAGNOSIS — R2689 Other abnormalities of gait and mobility: Secondary | ICD-10-CM

## 2024-06-21 DIAGNOSIS — R2681 Unsteadiness on feet: Secondary | ICD-10-CM

## 2024-06-21 DIAGNOSIS — M6281 Muscle weakness (generalized): Secondary | ICD-10-CM | POA: Diagnosis not present

## 2024-06-21 DIAGNOSIS — R269 Unspecified abnormalities of gait and mobility: Secondary | ICD-10-CM

## 2024-06-21 NOTE — Therapy (Signed)
 OUTPATIENT PHYSICAL THERAPY NEURO TREATMENT/PHYSICAL THERAPY PROGRESS NOTE   Dates of reporting period  04/28/24   to   06/21/24     Patient Name: Gregory Crane MRN: [id number] DOB:05/19/1971, 53 y.o., male Today's Date: 06/21/2024  PCP: Glendia Shad  REFERRING PROVIDER: Glendia Shad  END OF SESSION:  PT End of Session - 06/21/24 1119     Visit Number 30    Number of Visits 39    Date for Recertification  06/21/24    Progress Note Due on Visit 20    PT Start Time 1116    PT Stop Time 1145    PT Time Calculation (min) 29 min    Equipment Utilized During Treatment Gait belt    Activity Tolerance Patient tolerated treatment well    Behavior During Therapy [facility] for tasks assessed/performed            Past Medical History:  Diagnosis Date   Allergy    Anemia    Bell's palsy    Diabetes mellitus without complication (HCC)    diet controlled   Fall 07/2023   Hypertension    Hypothyroidism    Kidney stones    Pseudotumor cerebri    Stroke [facility])    Past Surgical History:  Procedure Laterality Date   COLONOSCOPY WITH PROPOFOL  N/A 03/30/2020   Procedure: COLONOSCOPY WITH PROPOFOL ;  Surgeon: Maryruth Ole DASEN, MD;  Location: [facility] ENDOSCOPY;  Service: Endoscopy;  Laterality: N/A;   CORONARY STENT INTERVENTION N/A 03/17/2024   Procedure: CORONARY STENT INTERVENTION;  Surgeon: Jordan, Peter M, MD;  Location: [facility] INVASIVE CV LAB;  Service: Cardiovascular;  Laterality: N/A;   LEFT HEART CATH AND CORONARY ANGIOGRAPHY N/A 03/17/2024   Procedure: LEFT HEART CATH AND CORONARY ANGIOGRAPHY;  Surgeon: Jordan, Peter M, MD;  Location: [facility] INVASIVE CV LAB;  Service: Cardiovascular;  Laterality: N/A;   LOOP RECORDER INSERTION N/A 01/27/2018   Procedure: LOOP RECORDER INSERTION;  Surgeon: Fernande Elspeth BROCKS, MD;  Location: [facility] INVASIVE CV LAB;  Service: Cardiovascular;  Laterality: N/A;   LUMBAR PUNCTURE     as child   NO PAST SURGERIES     REMOVAL OF A DIALYSIS CATHETER     TEE  WITHOUT CARDIOVERSION N/A 01/07/2018   Procedure: TRANSESOPHAGEAL ECHOCARDIOGRAM (TEE);  Surgeon: Perla Evalene PARAS, MD;  Location: [facility] ORS;  Service: Cardiovascular;  Laterality: N/A;   Patient Active Problem List   Diagnosis Date Noted   Bradycardia 04/30/2024   CAD S/P percutaneous coronary angioplasty 03/17/2024   Non-ST elevation (NSTEMI) myocardial infarction (HCC) 03/16/2024   Hypervolemia 03/14/2024   Hypoxic respiratory failure (HCC) 03/14/2024   Demand ischemia (HCC) 03/14/2024   Left hip pain 01/30/2024   Acute on chronic heart failure with preserved ejection fraction (HFpEF) (HCC) 12/26/2023   Community acquired pneumonia 12/24/2023   Acute respiratory failure with hypoxia (HCC) 12/23/2023   SVT (supraventricular tachycardia) 08/10/2023   Atrial fibrillation (HCC) 07/03/2023   Atrial fibrillation with RVR (HCC) 07/02/2023   Fall 06/15/2023   Non-compliance with renal dialysis 05/02/2023   Obesity (BMI 30-39.9) 04/30/2023   Volume overload 04/29/2023   Renal osteodystrophy 11/25/2022   Unsteady gait 10/05/2022   Steal syndrome of dialysis vascular access 05/28/2022   Hydronephrosis, left 05/04/2022   Leukocytosis 05/04/2022   Chest pain 01/13/2022   Diabetic retinopathy associated with diabetes mellitus due to underlying condition (HCC) 01/13/2022   ESRD on hemodialysis (HCC) 01/13/2022   Deafness in right ear 08/11/2021   Open wound 01/25/2021   History  of colon polyps 10/15/2020   Acute cholecystitis without calculus 09/09/2020   Type 2 diabetes mellitus, with long-term current use of insulin  (HCC) 09/09/2020   Cryptogenic stroke (HCC) 07/13/2020   History of loop recorder 07/13/2020   Elevated troponin 04/03/2020   Acquired trigger finger 06/08/2019   Lymphedema 06/08/2019   Anemia 04/17/2019   Swelling of both lower extremities 01/10/2019   Facial droop 04/09/2018   Daytime somnolence 03/30/2018   Carotid artery disease 02/03/2018   Intracranial vascular  stenosis 09/12/2017   Cough 01/20/2017   Bell's palsy 11/10/2016   History of CVA (cerebrovascular accident) 11/10/2016   Benign localized hyperplasia of prostate with urinary obstruction 10/27/2016   History of nephrolithiasis 10/27/2016   TIA (transient ischemic attack) 10/20/2016   Near syncope 06/23/2016   Organic impotence 10/01/2015   Neuropathy 08/06/2015   Health care maintenance 08/06/2015   Hypertension 08/06/2015   Hypothyroidism 10/10/2013   Microalbuminuria 10/10/2013   Hyperlipidemia 10/10/2013   B12 deficiency 10/10/2013   Environmental allergies 07/04/2013    ONSET DATE: 3 years ago  REFERRING DIAG: unsteady gait  THERAPY DIAG:  Muscle weakness (generalized)  Unsteadiness on feet  Other abnormalities of gait and mobility  Difficulty in walking, not elsewhere classified  Abnormality of gait and mobility  Other lack of coordination  Rationale for Evaluation and Treatment: Rehabilitation  SUBJECTIVE:                                                                                                                                                                                             SUBJECTIVE STATEMENT:   Pt reports he fell asleep on the couch waiting on his ride, and that's probably why he was late in getting here.  Pt reports his balance is a little off, which is not great for a progress report.  Pt accompanied by: self  PERTINENT HISTORY: Patient returning to PT s/p hospitalization for pneumonia, , SVT, ESRD on hypodialysis. PMH DM, HTN, Anemia, Bells palsy, Hypothyroidism, Kidney stones, stroke and pseudotumour cerebri. Macular degeneration with impaired visual acuity   PAIN:  Are you having pain? Some L foot numbness/neuropathy  PRECAUTIONS: Fall  RED FLAGS: None   WEIGHT BEARING RESTRICTIONS: No  FALLS: Has patient fallen in last 6 months? No  LIVING ENVIRONMENT: Lives with: lives alone Lives in: House/apartment Stairs: yes stairs  in house Has following equipment at home: Single point cane  PLOF: Independent with basic ADLs  PATIENT GOALS: balance, strength  OBJECTIVE:  Note: Objective measures were completed at Evaluation unless otherwise noted.  DIAGNOSTIC FINDINGS: IMPRESSION: Interval resolution of previously noted trace left parafalcine subdural  hematoma. No acute intracranial process.  COGNITION: Overall cognitive status: Within functional limits for tasks assessed   SENSATION: WFL  COORDINATION: Heel slide: WFL RLE; slight decrease with LLE   MUSCLE TONE: WFL  POSTURE: rounded shoulders, forward head, and posterior pelvic tilt  LOWER EXTREMITY MMT:    MMT Right Eval Left Eval  Hip flexion 4 4  Hip extension    Hip abduction 3 3  Hip adduction 3+ 3+  Hip internal rotation    Hip external rotation    Knee flexion 4- 4-  Knee extension 4 4  Ankle dorsiflexion 3 2+  Ankle plantarflexion 3+ 3  Ankle inversion    Ankle eversion    (Blank rows = not tested)  BED MOBILITY:  Not tested  TRANSFERS: Sit to stand: CGA  Assistive device utilized: None     Stand to sit: CGA  Assistive device utilized: None     Chair to chair: CGA  Assistive device utilized: None       RAMP:  Not tested  CURB:  Findings: needs UE support to step up  STAIRS: heavy BUE support step over step negotiation.  GAIT: Findings: Gait Characteristics: decreased stride length, Left foot flat, wide BOS, and poor foot clearance- Left, Distance walked: 60 ft, Assistive device utilized:None, Level of assistance: CGA, and Comments: increased L ankle rolling due to not wearing AFO  FUNCTIONAL TESTS:  5 times sit to stand: 30.53 with 4 posterior LOB 6 minute walk test: perform next session  Berg Balance Scale: 26  PATIENT SURVEYS:  LEFS give next session                                                                                                                               TREATMENT DATE:  06/21/24  Physical Performance Testing:   6 Min Walk Test:  Instructed patient to ambulate as quickly and as safely as possible for 6 minutes using LRAD. Patient was allowed to take standing rest breaks without stopping the test, but if the patient required a sitting rest break the clock would be stopped and the test would be over.  Results: 792 feet using no AD with supervision. Results indicate that the patient has reduced endurance with ambulation compared to age matched norms.  Age Matched Norms (in meters): 53-69 yo M: 103 F: 38, 52-79 yo M: 62 F: 471, 85-89 yo M: 417 F: 392 MDC: 58.21 meters (190.98 feet) or 50 meters (ANPTA Core Set of Outcome Measures for Adults with Neurologic Conditions, 2018)  LEFS: 38/80  Five times Sit to Stand Test (FTSS)  TIME: 21.7 sec with posterior LOB x2  Cut off scores indicative of increased fall risk: >12 sec CVA, >16 sec PD, >13 sec vestibular (ANPTA Core Set of Outcome Measures for Adults with Neurologic Conditions, 2018)       PATIENT EDUCATION: Education details: goals, POC, HEP Person educated: Patient Education method: Explanation, Demonstration, Tactile cues, Verbal  cues, and Handouts Education comprehension: verbalized understanding, returned demonstration, verbal cues required, tactile cues required, and needs further education  HOME EXERCISE PROGRAM: Access Code: [email or web address] URL: [email or web address] Date: 05/26/2024 Prepared by: Sidra Simpers  Exercises - Leg Extension  - 1 x daily - 7 x weekly - 2 sets - 10 reps - 5 hold - Seated March  - 1 x daily - 7 x weekly - 2 sets - 10 reps - 5 hold - Seated Heel Raise  - 1 x daily - 7 x weekly - 2 sets - 10 reps - 5 hold - Sit to Stand  - 1 x daily - 7 x weekly - 3 sets - 10 reps - Mini Squat with Counter Support  - 1 x daily - 7 x weekly - 3 sets - 10 reps - Standing 4-Way Leg Reach with Counter Support  - 1 x daily - 7 x weekly - 3 sets - 10 reps  GOALS: Goals  reviewed with patient? Yes  SHORT TERM GOALS: Target date: 01/26/2024  Patient will be independent in home exercise program to improve strength/mobility for better functional independence with ADLs.  Baseline: Goal status: INITIAL  LONG TERM GOALS: Target date: 03/22/2024  Patient (< 26 years old) will complete five times sit to stand test in < 10 seconds indicating an increased LE strength and improved balance.  Baseline: 30.53 with 4 posterior LOB 03/29/24: 22.61 with 3 posterior LOB 06/21/24: 21.7 with 2 posterior LOB Goal status: PROGRESSING  2.   Patient will increase Berg Balance score by >45/56 to demonstrate decreased fall risk during functional activities.  Baseline: 5/5: 26  03/29/24: 32 Goal status: INITIAL  3.    Patient will increase six minute walk test distance to >1200 for progression to age norm community ambulator and improve gait ability  Baseline: perform next session 5/12: 710 ft  03/29/24: 770' 06/18/24: 792' Goal status: PROGRESSING  4.  Patient will score > 60/80 on LEFS for improved functional mobility and quality of life.  Baseline: perform next session 5/12: 43 03/29/24: 39/80 06/21/24: 38/80 Goal status: INITIAL    ASSESSMENT:  CLINICAL IMPRESSION:   Pt was able to improve in all of the goals assessed during the session, however time was limited by late arrival.  Pt has performed well with all the related tasks, and is having less instances of instability with the testing.  Pt still has ankle instability and posterior balance loss when coming upright into stand and typically when ambulating has posterior instability.  Patient's condition has the potential to improve in response to therapy. Maximum improvement is yet to be obtained. The anticipated improvement is attainable and reasonable in a generally predictable time.   Pt will continue to benefit from skilled therapy to address remaining deficits in order to improve overall QoL and return to PLOF.           OBJECTIVE IMPAIRMENTS: Abnormal gait, cardiopulmonary status limiting activity, decreased activity tolerance, decreased balance, decreased coordination, decreased endurance, decreased knowledge of use of DME, decreased mobility, difficulty walking, decreased ROM, decreased strength, dizziness, impaired perceived functional ability, impaired flexibility, impaired vision/preception, improper body mechanics, postural dysfunction, and obesity.   ACTIVITY LIMITATIONS: carrying, lifting, bending, sitting, standing, squatting, stairs, transfers, bed mobility, toileting, dressing, reach over head, hygiene/grooming, locomotion level, and caring for others  PARTICIPATION LIMITATIONS: meal prep, cleaning, laundry, driving, shopping, community activity, occupation, and yard work  PERSONAL FACTORS: Age, Behavior pattern, Fitness, Past/current experiences, Time since onset of injury/illness/exacerbation, Transportation,  and 3+ comorbidities: DM, HTN, Anemia, Bells palsy, Hypothyroidism, Kidney stones, stroke and pseudotumour cerebri. Macular degeneration with impaired visual acuity  are also affecting patient's functional outcome.   REHAB POTENTIAL: Good  CLINICAL DECISION MAKING: Evolving/moderate complexity  EVALUATION COMPLEXITY: Moderate  PLAN:  PT FREQUENCY: 2x/week  PT DURATION: 12 weeks  PLANNED INTERVENTIONS: 97164- PT Re-evaluation, 97750- Physical Performance Testing, 97110-Therapeutic exercises, 97530- Therapeutic activity, [id number]- Neuromuscular re-education, 97535- Self Care, [id number]- Manual therapy, [id number]- Gait training, [id number]- Orthotic Initial, [id number]- Orthotic/Prosthetic subsequent, [phone]- Canalith repositioning, [id number]- Splinting, H9716- Electrical stimulation (unattended), [phone]- Electrical stimulation (manual), [id number]- Vasopneumatic device, [id number]- Ultrasound, [id number]- Traction (mechanical), [id number]- Ionotophoresis 4mg /ml Dexamethasone , Patient/Family education, Balance training, Stair  training, Taping, Dry Needling, Joint mobilization, Spinal mobilization, Scar mobilization, Compression bandaging, Vestibular training, Visual/preceptual remediation/compensation, Cognitive remediation, DME instructions, Cryotherapy, Moist heat, and Biofeedback  PLAN FOR NEXT SESSION:  Bl[employer]s, Korebalance balance, strength, L ankle strength, stairs, LE endurance   Fonda Simpers, PT, DPT Physical Therapist - [place]  [facility]  06/21/24, 1:16 PM

## 2024-06-23 ENCOUNTER — Ambulatory Visit

## 2024-06-23 DIAGNOSIS — M6281 Muscle weakness (generalized): Secondary | ICD-10-CM | POA: Diagnosis not present

## 2024-06-23 DIAGNOSIS — R2681 Unsteadiness on feet: Secondary | ICD-10-CM

## 2024-06-23 DIAGNOSIS — R278 Other lack of coordination: Secondary | ICD-10-CM

## 2024-06-23 DIAGNOSIS — R2689 Other abnormalities of gait and mobility: Secondary | ICD-10-CM

## 2024-06-23 DIAGNOSIS — R269 Unspecified abnormalities of gait and mobility: Secondary | ICD-10-CM

## 2024-06-23 DIAGNOSIS — R262 Difficulty in walking, not elsewhere classified: Secondary | ICD-10-CM

## 2024-06-23 NOTE — Therapy (Addendum)
 OUTPATIENT PHYSICAL THERAPY NEURO TREATMENT/RE-CERT   Patient Name: Gregory Crane MRN: [id number] DOB:10/20/1970, 53 y.o., male Today's Date: 06/23/2024  PCP: Glendia Shad  REFERRING PROVIDER: Glendia Shad  END OF SESSION:  PT End of Session - 06/23/24 1150     Visit Number 31    Number of Visits 48   Date for Recertification  08/16/24    Progress Note Due on Visit 20    PT Start Time 1149    PT Stop Time 1230    PT Time Calculation (min) 41 min    Equipment Utilized During Treatment Gait belt    Activity Tolerance Patient tolerated treatment well    Behavior During Therapy [facility] for tasks assessed/performed         Past Medical History:  Diagnosis Date   Allergy    Anemia    Bell's palsy    Diabetes mellitus without complication (HCC)    diet controlled   Fall 07/2023   Hypertension    Hypothyroidism    Kidney stones    Pseudotumor cerebri    Stroke [facility])    Past Surgical History:  Procedure Laterality Date   COLONOSCOPY WITH PROPOFOL  N/A 03/30/2020   Procedure: COLONOSCOPY WITH PROPOFOL ;  Surgeon: Maryruth Ole DASEN, MD;  Location: [facility] ENDOSCOPY;  Service: Endoscopy;  Laterality: N/A;   CORONARY STENT INTERVENTION N/A 03/17/2024   Procedure: CORONARY STENT INTERVENTION;  Surgeon: Jordan, Peter M, MD;  Location: [facility] INVASIVE CV LAB;  Service: Cardiovascular;  Laterality: N/A;   LEFT HEART CATH AND CORONARY ANGIOGRAPHY N/A 03/17/2024   Procedure: LEFT HEART CATH AND CORONARY ANGIOGRAPHY;  Surgeon: Jordan, Peter M, MD;  Location: [facility] INVASIVE CV LAB;  Service: Cardiovascular;  Laterality: N/A;   LOOP RECORDER INSERTION N/A 01/27/2018   Procedure: LOOP RECORDER INSERTION;  Surgeon: Fernande Elspeth BROCKS, MD;  Location: [facility] INVASIVE CV LAB;  Service: Cardiovascular;  Laterality: N/A;   LUMBAR PUNCTURE     as child   NO PAST SURGERIES     REMOVAL OF A DIALYSIS CATHETER     TEE WITHOUT CARDIOVERSION N/A 01/07/2018   Procedure: TRANSESOPHAGEAL ECHOCARDIOGRAM (TEE);   Surgeon: Perla Evalene PARAS, MD;  Location: [facility] ORS;  Service: Cardiovascular;  Laterality: N/A;   Patient Active Problem List   Diagnosis Date Noted   Bradycardia 04/30/2024   CAD S/P percutaneous coronary angioplasty 03/17/2024   Non-ST elevation (NSTEMI) myocardial infarction (HCC) 03/16/2024   Hypervolemia 03/14/2024   Hypoxic respiratory failure (HCC) 03/14/2024   Demand ischemia (HCC) 03/14/2024   Left hip pain 01/30/2024   Acute on chronic heart failure with preserved ejection fraction (HFpEF) (HCC) 12/26/2023   Community acquired pneumonia 12/24/2023   Acute respiratory failure with hypoxia (HCC) 12/23/2023   SVT (supraventricular tachycardia) 08/10/2023   Atrial fibrillation (HCC) 07/03/2023   Atrial fibrillation with RVR (HCC) 07/02/2023   Fall 06/15/2023   Non-compliance with renal dialysis 05/02/2023   Obesity (BMI 30-39.9) 04/30/2023   Volume overload 04/29/2023   Renal osteodystrophy 11/25/2022   Unsteady gait 10/05/2022   Steal syndrome of dialysis vascular access 05/28/2022   Hydronephrosis, left 05/04/2022   Leukocytosis 05/04/2022   Chest pain 01/13/2022   Diabetic retinopathy associated with diabetes mellitus due to underlying condition (HCC) 01/13/2022   ESRD on hemodialysis (HCC) 01/13/2022   Deafness in right ear 08/11/2021   Open wound 01/25/2021   History of colon polyps 10/15/2020   Acute cholecystitis without calculus 09/09/2020   Type 2 diabetes mellitus, with long-term current use of insulin  (  HCC) 09/09/2020   Cryptogenic stroke (HCC) 07/13/2020   History of loop recorder 07/13/2020   Elevated troponin 04/03/2020   Acquired trigger finger 06/08/2019   Lymphedema 06/08/2019   Anemia 04/17/2019   Swelling of both lower extremities 01/10/2019   Facial droop 04/09/2018   Daytime somnolence 03/30/2018   Carotid artery disease 02/03/2018   Intracranial vascular stenosis 09/12/2017   Cough 01/20/2017   Bell's palsy 11/10/2016   History of CVA  (cerebrovascular accident) 11/10/2016   Benign localized hyperplasia of prostate with urinary obstruction 10/27/2016   History of nephrolithiasis 10/27/2016   TIA (transient ischemic attack) 10/20/2016   Near syncope 06/23/2016   Organic impotence 10/01/2015   Neuropathy 08/06/2015   Health care maintenance 08/06/2015   Hypertension 08/06/2015   Hypothyroidism 10/10/2013   Microalbuminuria 10/10/2013   Hyperlipidemia 10/10/2013   B12 deficiency 10/10/2013   Environmental allergies 07/04/2013    ONSET DATE: 3 years ago  REFERRING DIAG: unsteady gait  THERAPY DIAG:  Muscle weakness (generalized)  Unsteadiness on feet  Other abnormalities of gait and mobility  Difficulty in walking, not elsewhere classified  Abnormality of gait and mobility  Other lack of coordination  Rationale for Evaluation and Treatment: Rehabilitation  SUBJECTIVE:                                                                                                                                                                                             SUBJECTIVE STATEMENT:   Pt reports no new complaints upon arrival.  Pt denies pain and no falls.  Pt accompanied by: self  PERTINENT HISTORY: Patient returning to PT s/p hospitalization for pneumonia, , SVT, ESRD on hypodialysis. PMH DM, HTN, Anemia, Bells palsy, Hypothyroidism, Kidney stones, stroke and pseudotumour cerebri. Macular degeneration with impaired visual acuity   PAIN:  Are you having pain? Some L foot numbness/neuropathy  PRECAUTIONS: Fall  RED FLAGS: None   WEIGHT BEARING RESTRICTIONS: No  FALLS: Has patient fallen in last 6 months? No  LIVING ENVIRONMENT: Lives with: lives alone Lives in: House/apartment Stairs: yes stairs in house Has following equipment at home: Single point cane  PLOF: Independent with basic ADLs  PATIENT GOALS: balance, strength  OBJECTIVE:  Note: Objective measures were completed at Evaluation  unless otherwise noted.  DIAGNOSTIC FINDINGS: IMPRESSION: Interval resolution of previously noted trace left parafalcine subdural hematoma. No acute intracranial process.  COGNITION: Overall cognitive status: Within functional limits for tasks assessed   SENSATION: WFL  COORDINATION: Heel slide: WFL RLE; slight decrease with LLE   MUSCLE TONE: WFL  POSTURE: rounded shoulders, forward head, and posterior pelvic tilt  LOWER EXTREMITY  MMT:    MMT Right Eval Left Eval  Hip flexion 4 4  Hip extension    Hip abduction 3 3  Hip adduction 3+ 3+  Hip internal rotation    Hip external rotation    Knee flexion 4- 4-  Knee extension 4 4  Ankle dorsiflexion 3 2+  Ankle plantarflexion 3+ 3  Ankle inversion    Ankle eversion    (Blank rows = not tested)  BED MOBILITY:  Not tested  TRANSFERS: Sit to stand: CGA  Assistive device utilized: None     Stand to sit: CGA  Assistive device utilized: None     Chair to chair: CGA  Assistive device utilized: None       RAMP:  Not tested  CURB:  Findings: needs UE support to step up  STAIRS: heavy BUE support step over step negotiation.  GAIT: Findings: Gait Characteristics: decreased stride length, Left foot flat, wide BOS, and poor foot clearance- Left, Distance walked: 60 ft, Assistive device utilized:None, Level of assistance: CGA, and Comments: increased L ankle rolling due to not wearing AFO  FUNCTIONAL TESTS:  5 times sit to stand: 30.53 with 4 posterior LOB 6 minute walk test: perform next session  Berg Balance Scale: 26  PATIENT SURVEYS:  LEFS give next session                                                                                                                               TREATMENT DATE: 06/23/24  TherAct: To improve functional movements patterns for everyday tasks   The patient completed 10 minutes at level(s) 3-8 on the [facility] using both BUE/BLE reciprocal movements to promote strength,  endurance, and cardiorespiratory fitness. PT increased the resistance level and monitored the patient's response to the intervention throughout. The patient required min cueing for technique and supervision level of assistance.    STS x10 with verbal cues for reduced speed to prevent backwards unsteadiness   Neuro:   Forward step over 2 hurdles without UE assistance, 2x10   Lateral step over 2 hurdles, 2x10 each direction   Static rhomberg stance, 30 sec bouts x2  Ambulation in the hallways with head turns,length of the hallway, x2 Ambulation in the hallways with head nods,length of the hallway, x2 Retro ambulation in the hallways length of the hallway, x1   The Blaze Pod Focus setting was selected to refine precision and concentration, isolating specific muscle groups or movements to enhance overall coordination and targeted muscle engagement.  Activity Description: Pt advised to only tap the Red lit pod with 5 other distracting pods placed on the mirror and floor, spaced apart which caused the pt to have to turn and laterally move to improve overall balance and turns.   Activity Setting:  FOCUS Number of Pods:  6 Cycles/Sets:  2 sets Duration (Time or Hit Count):  30 hits     PATIENT EDUCATION: Education details: goals, POC, HEP  Person educated: Patient Education method: Explanation, Demonstration, Tactile cues, Verbal cues, and Handouts Education comprehension: verbalized understanding, returned demonstration, verbal cues required, tactile cues required, and needs further education  HOME EXERCISE PROGRAM: Access Code: [email or web address] URL: [email or web address] Date: 05/26/2024 Prepared by: Sidra Simpers  Exercises - Leg Extension  - 1 x daily - 7 x weekly - 2 sets - 10 reps - 5 hold - Seated March  - 1 x daily - 7 x weekly - 2 sets - 10 reps - 5 hold - Seated Heel Raise  - 1 x daily - 7 x weekly - 2 sets - 10 reps - 5 hold - Sit to Stand  - 1 x daily - 7 x weekly  - 3 sets - 10 reps - Mini Squat with Counter Support  - 1 x daily - 7 x weekly - 3 sets - 10 reps - Standing 4-Way Leg Reach with Counter Support  - 1 x daily - 7 x weekly - 3 sets - 10 reps  GOALS: Goals reviewed with patient? Yes  SHORT TERM GOALS: Target date: 01/26/2024  Patient will be independent in home exercise program to improve strength/mobility for better functional independence with ADLs.  Baseline: Goal status: INITIAL  LONG TERM GOALS: Target date: 03/22/2024  Patient (< 42 years old) will complete five times sit to stand test in < 10 seconds indicating an increased LE strength and improved balance.  Baseline: 30.53 with 4 posterior LOB 03/29/24: 22.61 with 3 posterior LOB 06/21/24: 21.7 with 2 posterior LOB Goal status: PROGRESSING  2.   Patient will increase Berg Balance score by >45/56 to demonstrate decreased fall risk during functional activities.  Baseline: 5/5: 26  03/29/24: 32 Goal status: INITIAL  3.    Patient will increase six minute walk test distance to >1200 for progression to age norm community ambulator and improve gait ability  Baseline: perform next session 5/12: 710 ft  03/29/24: 770' 06/18/24: 792' Goal status: PROGRESSING  4.  Patient will score > 60/80 on LEFS for improved functional mobility and quality of life.  Baseline: perform next session 5/12: 43 03/29/24: 39/80 06/21/24: 38/80 Goal status: INITIAL    ASSESSMENT:  CLINICAL IMPRESSION:   Pt is making good progress and was challenged with head turns in the hallway, and when performing the task with the blazepods.  Pt ultimately still exhibits ataxic movements that cause him to lose balance posteriorly and needed therapist assistance for 2-3 occasions, but otherwise able to remain upright.   Pt will continue to benefit from skilled therapy to address remaining deficits in order to improve overall QoL and return to PLOF.       OBJECTIVE IMPAIRMENTS: Abnormal gait, cardiopulmonary status  limiting activity, decreased activity tolerance, decreased balance, decreased coordination, decreased endurance, decreased knowledge of use of DME, decreased mobility, difficulty walking, decreased ROM, decreased strength, dizziness, impaired perceived functional ability, impaired flexibility, impaired vision/preception, improper body mechanics, postural dysfunction, and obesity.   ACTIVITY LIMITATIONS: carrying, lifting, bending, sitting, standing, squatting, stairs, transfers, bed mobility, toileting, dressing, reach over head, hygiene/grooming, locomotion level, and caring for others  PARTICIPATION LIMITATIONS: meal prep, cleaning, laundry, driving, shopping, community activity, occupation, and yard work  PERSONAL FACTORS: Age, Behavior pattern, Fitness, Past/current experiences, Time since onset of injury/illness/exacerbation, Transportation, and 3+ comorbidities: DM, HTN, Anemia, Bells palsy, Hypothyroidism, Kidney stones, stroke and pseudotumour cerebri. Macular degeneration with impaired visual acuity  are also affecting patient's functional outcome.   REHAB POTENTIAL: Good  CLINICAL DECISION MAKING: Evolving/moderate  complexity  EVALUATION COMPLEXITY: Moderate  PLAN:  PT FREQUENCY: 2x/week  PT DURATION: 12 weeks  PLANNED INTERVENTIONS: 97164- PT Re-evaluation, 97750- Physical Performance Testing, 97110-Therapeutic exercises, 97530- Therapeutic activity, [id number]- Neuromuscular re-education, 97535- Self Care, [id number]- Manual therapy, [id number]- Gait training, [phone]- Orthotic Initial, [phone]- Orthotic/Prosthetic subsequent, [phone]- Canalith repositioning, [id number]- Splinting, H9716- Electrical stimulation (unattended), [phone]- Electrical stimulation (manual), [id number]- Vasopneumatic device, [id number]- Ultrasound, [id number]- Traction (mechanical), [id number]- Ionotophoresis 4mg /ml Dexamethasone , Patient/Family education, Balance training, Stair training, Taping, Dry Needling, Joint mobilization, Spinal mobilization, Scar  mobilization, Compression bandaging, Vestibular training, Visual/preceptual remediation/compensation, Cognitive remediation, DME instructions, Cryotherapy, Moist heat, and Biofeedback  PLAN FOR NEXT SESSION:  Blaze Pods, Korebalance balance, strength, L ankle strength, stairs, LE endurance   Fonda Simpers, PT, DPT Physical Therapist - [facility]  [facility]  06/23/24, 1:26 PM

## 2024-06-25 ENCOUNTER — Ambulatory Visit: Admitting: Internal Medicine

## 2024-06-25 ENCOUNTER — Ambulatory Visit (INDEPENDENT_AMBULATORY_CARE_PROVIDER_SITE_OTHER): Admitting: Internal Medicine

## 2024-06-25 ENCOUNTER — Telehealth: Payer: Self-pay

## 2024-06-25 ENCOUNTER — Encounter: Payer: Self-pay | Admitting: Internal Medicine

## 2024-06-25 VITALS — BP 128/72 | HR 52 | Temp 97.8°F | Ht 72.0 in | Wt 250.8 lb

## 2024-06-25 DIAGNOSIS — E1122 Type 2 diabetes mellitus with diabetic chronic kidney disease: Secondary | ICD-10-CM | POA: Diagnosis not present

## 2024-06-25 DIAGNOSIS — E782 Mixed hyperlipidemia: Secondary | ICD-10-CM | POA: Diagnosis not present

## 2024-06-25 DIAGNOSIS — R29810 Facial weakness: Secondary | ICD-10-CM

## 2024-06-25 DIAGNOSIS — M7989 Other specified soft tissue disorders: Secondary | ICD-10-CM

## 2024-06-25 DIAGNOSIS — D649 Anemia, unspecified: Secondary | ICD-10-CM

## 2024-06-25 DIAGNOSIS — I10 Essential (primary) hypertension: Secondary | ICD-10-CM

## 2024-06-25 DIAGNOSIS — I4891 Unspecified atrial fibrillation: Secondary | ICD-10-CM

## 2024-06-25 DIAGNOSIS — I2510 Atherosclerotic heart disease of native coronary artery without angina pectoris: Secondary | ICD-10-CM

## 2024-06-25 DIAGNOSIS — E039 Hypothyroidism, unspecified: Secondary | ICD-10-CM

## 2024-06-25 DIAGNOSIS — N186 End stage renal disease: Secondary | ICD-10-CM

## 2024-06-25 DIAGNOSIS — Z794 Long term (current) use of insulin: Secondary | ICD-10-CM

## 2024-06-25 DIAGNOSIS — G629 Polyneuropathy, unspecified: Secondary | ICD-10-CM

## 2024-06-25 DIAGNOSIS — R2681 Unsteadiness on feet: Secondary | ICD-10-CM

## 2024-06-25 DIAGNOSIS — I779 Disorder of arteries and arterioles, unspecified: Secondary | ICD-10-CM

## 2024-06-25 DIAGNOSIS — Z8673 Personal history of transient ischemic attack (TIA), and cerebral infarction without residual deficits: Secondary | ICD-10-CM

## 2024-06-25 DIAGNOSIS — I471 Supraventricular tachycardia, unspecified: Secondary | ICD-10-CM

## 2024-06-25 DIAGNOSIS — R001 Bradycardia, unspecified: Secondary | ICD-10-CM

## 2024-06-25 DIAGNOSIS — E08319 Diabetes mellitus due to underlying condition with unspecified diabetic retinopathy without macular edema: Secondary | ICD-10-CM

## 2024-06-25 LAB — CBC WITH DIFFERENTIAL/PLATELET
Basophils Absolute: 0 K/uL (ref 0.0–0.1)
Basophils Relative: 0.9 % (ref 0.0–3.0)
Eosinophils Absolute: 0.1 K/uL (ref 0.0–0.7)
Eosinophils Relative: 1.3 % (ref 0.0–5.0)
HCT: 36.6 % — ABNORMAL LOW (ref 39.0–52.0)
Hemoglobin: 12.2 g/dL — ABNORMAL LOW (ref 13.0–17.0)
Lymphocytes Relative: 24.6 % (ref 12.0–46.0)
Lymphs Abs: 1.2 K/uL (ref 0.7–4.0)
MCHC: 33.3 g/dL (ref 30.0–36.0)
MCV: 97.5 fl (ref 78.0–100.0)
Monocytes Absolute: 0.5 K/uL (ref 0.1–1.0)
Monocytes Relative: 9.5 % (ref 3.0–12.0)
Neutro Abs: 3.1 K/uL (ref 1.4–7.7)
Neutrophils Relative %: 63.7 % (ref 43.0–77.0)
Platelets: 177 K/uL (ref 150.0–400.0)
RBC: 3.75 Mil/uL — ABNORMAL LOW (ref 4.22–5.81)
RDW: 13.9 % (ref 11.5–15.5)
WBC: 4.8 K/uL (ref 4.0–10.5)

## 2024-06-25 LAB — HEPATIC FUNCTION PANEL
ALT: 12 U/L (ref 0–53)
AST: 13 U/L (ref 0–37)
Albumin: 4.6 g/dL (ref 3.5–5.2)
Alkaline Phosphatase: 66 U/L (ref 39–117)
Bilirubin, Direct: 0.1 mg/dL (ref 0.0–0.3)
Total Bilirubin: 0.4 mg/dL (ref 0.2–1.2)
Total Protein: 7.4 g/dL (ref 6.0–8.3)

## 2024-06-25 LAB — LIPID PANEL
Cholesterol: 134 mg/dL (ref 0–200)
HDL: 29.5 mg/dL — ABNORMAL LOW (ref >39.00)
LDL Cholesterol: 70 mg/dL (ref 0–99)
NonHDL: 104.44
Total CHOL/HDL Ratio: 5
Triglycerides: 173 mg/dL — ABNORMAL HIGH (ref 0.0–149.0)
VLDL: 34.6 mg/dL (ref 0.0–40.0)

## 2024-06-25 LAB — TSH: TSH: 6.65 u[IU]/mL — ABNORMAL HIGH (ref 0.35–5.50)

## 2024-06-25 LAB — HEMOGLOBIN A1C: Hgb A1c MFr Bld: 6.4 % (ref 4.6–6.5)

## 2024-06-25 NOTE — Telephone Encounter (Signed)
 Patient would like to know if he needs to keep the appointment on 06/30/24 for this office?

## 2024-06-25 NOTE — Telephone Encounter (Signed)
This is the patient we discussed.

## 2024-06-25 NOTE — Progress Notes (Signed)
 Subjective:    Patient ID: Gregory Crane, male    DOB: 09-19-1970, 53 y.o.   MRN: [id number]  Patient here for  Chief Complaint  Patient presents with   Medical Management of Chronic Issues    6 week f/up    HPI Here for a scheduled follow up - follow up regarding diabetes, hypertension, hypercholesterolemia and hypothyroidism.he is accompanied by his sister. History obtained from both of them.  Has ESRD on dialysis. Admitted 03/14/24 - NSTEMI.  Is s/p LHC with PCI to mid RCA with DES.  Cardiology advised aspirin , Plavix  for 1 year. Resumed eliquis . Has been going to PT - unsteadiness. Has been having issues with blood pressure variation at dialysis. Blood pressure dropping during some dialysis sessions. Also, low heart rate noted at dialysis. Nephrologist recommended holding metoprolol  on the am of his dialysis sessions. We had discussed after last visit, decreasing the dose of his metoprolol . Reports he is not on amiodarone . Have asked sister to confirm medications when return home today and send information to us . Breathing overall stable. Getting around his house ok. No falls since last visit. Discussed the need for f/u with cardiology. He is agreeable. Discussed referral for help with transportation, pill checks ,etc. Decreased swelling.    Past Medical History:  Diagnosis Date   Allergy    Anemia    Bell's palsy    Diabetes mellitus without complication (HCC)    diet controlled   Fall 07/2023   Hypertension    Hypothyroidism    Kidney stones    Pseudotumor cerebri    Stroke [facility])    Past Surgical History:  Procedure Laterality Date   COLONOSCOPY WITH PROPOFOL  N/A 03/30/2020   Procedure: COLONOSCOPY WITH PROPOFOL ;  Surgeon: Maryruth Ole DASEN, MD;  Location: [facility] ENDOSCOPY;  Service: Endoscopy;  Laterality: N/A;   CORONARY STENT INTERVENTION N/A 03/17/2024   Procedure: CORONARY STENT INTERVENTION;  Surgeon: Jordan, Peter M, MD;  Location: [facility] INVASIVE CV LAB;  Service:  Cardiovascular;  Laterality: N/A;   LEFT HEART CATH AND CORONARY ANGIOGRAPHY N/A 03/17/2024   Procedure: LEFT HEART CATH AND CORONARY ANGIOGRAPHY;  Surgeon: Jordan, Peter M, MD;  Location: [facility] INVASIVE CV LAB;  Service: Cardiovascular;  Laterality: N/A;   LOOP RECORDER INSERTION N/A 01/27/2018   Procedure: LOOP RECORDER INSERTION;  Surgeon: Fernande Elspeth BROCKS, MD;  Location: [facility] INVASIVE CV LAB;  Service: Cardiovascular;  Laterality: N/A;   LUMBAR PUNCTURE     as child   NO PAST SURGERIES     REMOVAL OF A DIALYSIS CATHETER     TEE WITHOUT CARDIOVERSION N/A 01/07/2018   Procedure: TRANSESOPHAGEAL ECHOCARDIOGRAM (TEE);  Surgeon: Perla Evalene PARAS, MD;  Location: [facility] ORS;  Service: Cardiovascular;  Laterality: N/A;   Family History  Problem Relation Age of Onset   Breast cancer Mother    Diabetes Father    Diabetes Sister    Arthritis Maternal Grandmother    Diabetes Maternal Grandmother    Social History   Socioeconomic History   Marital status: Widowed    Spouse name: Clotilda   Number of children: 0   Years of education: Not on file   Highest education level: Some college, no degree  Occupational History   Occupation: disability  Tobacco Use   Smoking status: Never   Smokeless tobacco: Never  Vaping Use   Vaping status: Never Used  Substance and Sexual Activity   Alcohol use: No    Comment: last use early 2023   Drug use:  No   Sexual activity: Yes  Other Topics Concern   Not on file  Social History Narrative   Wife deceased 2020-04-10, has 2 step children, independent at baseline.   Social Drivers of Health   Financial Resource Strain: Medium Risk (04/29/2024)   Overall Financial Resource Strain (CARDIA)    Difficulty of Paying Living Expenses: Somewhat hard  Food Insecurity: Food Insecurity Present (04/29/2024)   Hunger Vital Sign    Worried About Running Out of Food in the Last Year: Sometimes true    Ran Out of Food in the Last Year: Never true  Transportation Needs:  Unmet Transportation Needs (04/29/2024)   PRAPARE - Administrator, Civil Service (Medical): Yes    Lack of Transportation (Non-Medical): Yes  Physical Activity: Insufficiently Active (04/29/2024)   Exercise Vital Sign    Days of Exercise per Week: 1 day    Minutes of Exercise per Session: 30 min  Stress: Stress Concern Present (04/29/2024)   [employer] of Occupational Health - Occupational Stress Questionnaire    Feeling of Stress: To some extent  Social Connections: Moderately Integrated (04/29/2024)   Social Connection and Isolation Panel    Frequency of Communication with Friends and Family: More than three times a week    Frequency of Social Gatherings with Friends and Family: Once a week    Attends Religious Services: More than 4 times per year    Active Member of [employer] or Organizations: Yes    Attends Banker Meetings: More than 4 times per year    Marital Status: Widowed     Review of Systems  Constitutional:  Negative for appetite change and unexpected weight change.  HENT:  Negative for congestion and sinus pressure.   Respiratory:  Negative for cough and chest tightness.        Breathing stable.   Cardiovascular:  Negative for chest pain and palpitations.       Swelling improved.   Gastrointestinal:  Negative for abdominal pain, diarrhea, nausea and vomiting.  Genitourinary:  Negative for dysuria.  Musculoskeletal:  Negative for joint swelling and myalgias.  Skin:  Negative for color change and rash.  Neurological:  Negative for dizziness and headaches.  Psychiatric/Behavioral:  Negative for agitation and dysphoric mood.        Objective:     BP 128/72   Pulse (!) 52   Temp 97.8 F (36.6 C) (Oral)   Ht 6' (1.829 m)   Wt 250 lb 12.8 oz (113.8 kg)   SpO2 98%   BMI 34.01 kg/m  Wt Readings from Last 3 Encounters:  06/25/24 250 lb 12.8 oz (113.8 kg)  04/30/24 267 lb (121.1 kg)  03/18/24 245 lb 13 oz (111.5 kg)    Physical Exam       Outpatient Encounter Medications as of 06/25/2024  Medication Sig   amiodarone  (PACERONE ) 200 MG tablet Take 1 tablet (200 mg total) by mouth 2 (two) times daily.   apixaban  (ELIQUIS ) 5 MG TABS tablet Take 1 tablet (5 mg total) by mouth 2 (two) times daily.   aspirin  EC 81 MG tablet Take 1 tablet (81 mg total) by mouth daily. Swallow whole.   atorvastatin  (LIPITOR ) 40 MG tablet Take 1 tablet (40 mg total) by mouth daily.   calcium  carbonate (TUMS - DOSED IN MG ELEMENTAL CALCIUM ) 500 MG chewable tablet Chew 2 tablets by mouth 3 (three) times daily with meals.   clopidogrel  (PLAVIX ) 75 MG tablet Take 1 tablet (75  mg total) by mouth daily with breakfast.   diltiazem  (CARDIZEM  CD) 120 MG 24 hr capsule Take 1 capsule (120 mg total) by mouth daily.   dorzolamide -timolol  (COSOPT ) 2-0.5 % ophthalmic solution Place 1 drop into both eyes 2 (two) times daily.   furosemide  (LASIX ) 80 MG tablet Take 80 mg by mouth daily.   hydrALAZINE  (APRESOLINE ) 25 MG tablet Take 25 mg by mouth 2 (two) times daily.   levothyroxine  (SYNTHROID ) 175 MCG tablet TAKE 1 TABLET (175 MCG) BY MOUTH ONCE DAILY BEFORE BREAKFAST.   metoprolol  tartrate 37.5 MG TABS Take 1 tablet (37.5 mg total) by mouth 2 (two) times daily.   pregabalin  (LYRICA ) 100 MG capsule TAKE 1 CAPSULE(100 MG) BY MOUTH AT BEDTIME   pantoprazole  (PROTONIX ) 40 MG tablet Take 1 tablet (40 mg total) by mouth daily. Take this medication for the month you will be taking aspirin , plavix , and Eliquis  all together.  STOP after 03/18/24   No facility-administered encounter medications on file as of 06/25/2024.     Lab Results  Component Value Date   WBC 4.8 06/25/2024   HGB 12.2 (L) 06/25/2024   HCT 36.6 (L) 06/25/2024   PLT 177.0 06/25/2024   GLUCOSE 120 (H) 03/18/2024   CHOL 134 06/25/2024   TRIG 173.0 (H) 06/25/2024   HDL 29.50 (L) 06/25/2024   LDLDIRECT 107.0 06/26/2021   LDLCALC 70 06/25/2024   ALT 12 06/25/2024   AST 13 06/25/2024   NA 134 (L)  03/18/2024   K 4.1 03/18/2024   CL 95 (L) 03/18/2024   CREATININE 9.58 (H) 03/18/2024   BUN 84 (H) 03/18/2024   CO2 21 (L) 03/18/2024   TSH 6.65 (H) 06/25/2024   PSA 0.48 10/04/2022   INR 1.3 (H) 08/05/2023   HGBA1C 6.4 06/25/2024    CT Chest Wo Contrast Result Date: 03/15/2024 CLINICAL DATA:  Dyspnea, chronic, central airway disease suspected EXAM: CT CHEST WITHOUT CONTRAST TECHNIQUE: Multidetector CT imaging of the chest was performed following the standard protocol without IV contrast. RADIATION DOSE REDUCTION: This exam was performed according to the departmental dose-optimization program which includes automated exposure control, adjustment of the mA and/or kV according to patient size and/or use of iterative reconstruction technique. COMPARISON:  Radiograph earlier today.  Chest CT 12/24/2023 FINDINGS: Cardiovascular: The heart is enlarged. There are coronary artery calcifications. No pericardial effusion. Aortic atherosclerosis. No aortic aneurysm. Main pulmonary artery is dilated at 3.8 cm. Mediastinum/Nodes: Shotty mediastinal lymphadenopathy, largest lymph node measures 9 mm short axis series 2, image 60. Hilar assessment is limited in the absence of IV contrast. Patulous esophagus without wall thickening. Lungs/Pleura: Small bilateral pleural effusions, right greater than left. These have slightly diminished since April exam. Associated compressive atelectasis in the lower lobes. Multifocal ground-glass and nodular opacities throughout both lungs with areas of septal thickening. Bowing of the distal trachea and central airways. Mild central bronchial thickening. Upper Abdomen: Cholecystectomy. No acute upper abdominal findings or free fluid. Musculoskeletal: Flowing anterior osteophytes throughout the thoracic spine. There are no acute or suspicious osseous abnormalities. IMPRESSION: 1. Multifocal ground-glass and nodular opacities throughout both lungs with areas of septal thickening.  Findings may represent pulmonary edema or atypical infection. 2. Small bilateral pleural effusions, right greater than left. These have slightly diminished since April exam. 3. Cardiomegaly with coronary artery calcifications. 4. Dilated main pulmonary artery, can be seen with pulmonary arterial hypertension. 5. Shotty mediastinal lymphadenopathy, likely reactive. 6. 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 Aortic Atherosclerosis (  ICD10-I70.0). Electronically Signed   By: Andrea Gasman M.D.   On: 03/15/2024 18:06   DG CHEST PORT 1 VIEW Result Date: 03/15/2024 CLINICAL DATA:  Difficulty breathing, low O2 sats, tachycardia. EXAM: PORTABLE CHEST 1 VIEW COMPARISON:  03/14/2024 and CT chest 12/24/2023. FINDINGS: Trachea is midline. Heart is enlarged. Diffuse mixed interstitial and airspace opacification appears progressive from 03/14/2024. Tiny bilateral pleural effusions. IMPRESSION: Coarsening congestive heart failure. Electronically Signed   By: Newell Eke M.D.   On: 03/15/2024 15:02   DG Chest Portable 1 View Result Date: 03/14/2024 EXAM: 1 VIEW XRAY OF THE CHEST 03/14/2024 09:23:00 PM COMPARISON: 12/23/2023 CLINICAL HISTORY: Chest pain, dyspnea. The patient has missed dialysis for one week, is complaining of being unable to breathe, has a fistula in the left arm, low O2 sats, rapid heart rate of 150, and nasal O2 placed. FINDINGS: LUNGS AND PLEURA: No focal pulmonary opacity. No pulmonary edema. No pleural effusion. No pneumothorax. Bibasilar atelectasis. HEART AND MEDIASTINUM: Mild cardiomegaly, unchanged. BONES AND SOFT TISSUES: No acute osseous abnormality. IMPRESSION: 1. Mild cardiomegaly, unchanged. 2. Bibasilar atelectasis. Electronically signed by: Franky Stanford MD 03/14/2024 09:32 PM EDT RP Workstation: [id number]       Assessment & Plan:  Type 2 diabetes mellitus with chronic kidney disease on chronic dialysis, with long-term current use of  insulin  (HCC) Assessment & Plan: Not checking sugars. States has to get a battery for his glucometer. Consider CGM. Low carb diet and exercise. Follow met b and A1c.   Orders: -     Hemoglobin A1c -     AMB Referral VBCI Care Management  Anemia, unspecified type Assessment & Plan: Check cbc. Labs have been followed through dialysis. Will check today.   Orders: -     CBC with Differential/Platelet  Atrial fibrillation, unspecified type [facility]) Assessment & Plan: With atrial fibrillation and currently on eliquis  and metoprolol . States off amiodarone . Will need to confirm. Having issues with bradycardia at dialysis. Pulse 52 today.  Discussed the need for f/u with cardiology/EP.  They have been trying to contact him to arrange appt. He is agreeable. I reached out to notify cardiology that he is agreeable for appt. Has been non compliant with taking synthroid  previously.  States is taking regularly now. Check tsh today. Continue eliquis . Will confirm dose of metoprolol . Was supposed to decrease dose after last visit.    ESRD on hemodialysis [facility]) Assessment & Plan: Followed by nephrology. Continues on dialysis. He is agreeable to dialysis two days per week. They are following kidney function and electrolytes. Discussed recent discussion regarding transplant. Has to get further out from his MI. On hold. Follow.   Orders: -     AMB Referral VBCI Care Management  Acquired hypothyroidism Assessment & Plan: Has previously been noncompliant with taking synthroid . States taking daily now. Check tsh today. Reports off amiodarone .   Orders: -     TSH  Mixed hyperlipidemia Assessment & Plan: Continue crestor . Check lipid panel today.   Orders: -     Lipid panel -     Hepatic function panel  Primary hypertension Assessment & Plan: Blood pressure as outlined. Currently on cardizem  and metoprolol . Follow pressures.    Swelling of both lower extremities Assessment & Plan: Has improved  significantly. Follow    Unsteady gait Assessment & Plan: Has been to PT. Continue exercise.    Bradycardia Assessment & Plan: With atrial fibrillation and currently on eliquis  and metoprolol . States off amiodarone . Will need to confirm. Having issues  with bradycardia at dialysis. Pulse 52 today.  Discussed the need for f/u with cardiology/EP.  They have been trying to contact him to arrange appt. He is agreeable. I reached out to notify cardiology that he is agreeable for appt. Has been non compliant with taking synthroid  previously.  States is taking regularly now. Check tsh today. Continue eliquis . Will confirm dose of metoprolol . Was supposed to decrease dose after last visit.    CAD S/P percutaneous coronary angioplasty Assessment & Plan: S/p PCI mid RCA (DES) - 03/17/24. Continue risk factor modification. Continue statin.  Discussed the need for f/u with cardiology. Cardiology notified.   Orders: -     AMB Referral VBCI Care Management  Bilateral carotid artery disease, unspecified type Assessment & Plan: Last carotid ultrasound - <50% bilaterally. Continue crestor . Blood pressure as outlined. Currently on aspirin  and plavix  and eliquis .    Diabetic retinopathy associated with diabetes mellitus due to underlying condition, macular edema presence unspecified, unspecified laterality, unspecified retinopathy severity (HCC) Assessment & Plan: Confirm f/u with ophthalmology.   Orders: -     AMB Referral VBCI Care Management  Facial droop  History of CVA (cerebrovascular accident) Assessment & Plan: History of CVA with previous left side weakness. Per review, on eliquis . Continue blood pressure control. Continue eliquis .    Neuropathy Assessment & Plan: Continue lyrica .   Orders: -     AMB Referral VBCI Care Management  SVT (supraventricular tachycardia) Assessment & Plan: Continues on eliquis  and metoprolol . Will need to confirm if still taking diltiazem . Low heart  rate. Will need to adjust medication. Follow.     I spent 45 minutes with the patient.   Time spent discussing her current concerns and symptoms.  Specifically time spent discussing his medications, recent issues with his blood pressure and heart rate and further testing. Time also spent discussing further w/up, evaluation and treatment.    Allena Hamilton, MD

## 2024-06-26 ENCOUNTER — Encounter: Payer: Self-pay | Admitting: Internal Medicine

## 2024-06-26 ENCOUNTER — Ambulatory Visit: Payer: Self-pay | Admitting: Internal Medicine

## 2024-06-26 NOTE — Assessment & Plan Note (Signed)
 S/p PCI mid RCA (DES) - 03/17/24. Continue risk factor modification. Continue statin.  Discussed the need for f/u with cardiology. Cardiology notified.

## 2024-06-26 NOTE — Assessment & Plan Note (Signed)
 Continue lyrica

## 2024-06-26 NOTE — Assessment & Plan Note (Signed)
 With atrial fibrillation and currently on eliquis  and metoprolol . States off amiodarone . Will need to confirm. Having issues with bradycardia at dialysis. Pulse 52 today.  Discussed the need for f/u with cardiology/EP.  They have been trying to contact him to arrange appt. He is agreeable. I reached out to notify cardiology that he is agreeable for appt. Has been non compliant with taking synthroid  previously.  States is taking regularly now. Check tsh today. Continue eliquis . Will confirm dose of metoprolol . Was supposed to decrease dose after last visit.

## 2024-06-26 NOTE — Assessment & Plan Note (Signed)
 Has been to PT. Continue exercise.

## 2024-06-26 NOTE — Assessment & Plan Note (Signed)
 Last carotid ultrasound - <50% bilaterally. Continue crestor . Blood pressure as outlined. Currently on aspirin  and plavix  and eliquis .

## 2024-06-26 NOTE — Assessment & Plan Note (Signed)
 Has previously been noncompliant with taking synthroid . States taking daily now. Check tsh today. Reports off amiodarone .

## 2024-06-26 NOTE — Assessment & Plan Note (Signed)
 Not checking sugars. States has to get a battery for his glucometer. Consider CGM. Low carb diet and exercise. Follow met b and A1c.

## 2024-06-26 NOTE — Assessment & Plan Note (Signed)
 Check cbc. Labs have been followed through dialysis. Will check today.

## 2024-06-26 NOTE — Assessment & Plan Note (Signed)
 Has improved significantly. Follow

## 2024-06-26 NOTE — Assessment & Plan Note (Signed)
 Followed by nephrology. Continues on dialysis. He is agreeable to dialysis two days per week. They are following kidney function and electrolytes. Discussed recent discussion regarding transplant. Has to get further out from his MI. On hold. Follow.

## 2024-06-26 NOTE — Assessment & Plan Note (Signed)
 Blood pressure as outlined. Currently on cardizem  and metoprolol . Follow pressures.

## 2024-06-26 NOTE — Assessment & Plan Note (Signed)
 Confirm f/u with ophthalmology.

## 2024-06-26 NOTE — Assessment & Plan Note (Signed)
Continue crestor. Check lipid panel today.

## 2024-06-26 NOTE — Assessment & Plan Note (Signed)
 History of CVA with previous left side weakness. Per review, on eliquis . Continue blood pressure control. Continue eliquis .

## 2024-06-26 NOTE — Assessment & Plan Note (Signed)
 Continues on eliquis  and metoprolol . Will need to confirm if still taking diltiazem . Low heart rate. Will need to adjust medication. Follow.

## 2024-06-28 ENCOUNTER — Ambulatory Visit: Attending: Internal Medicine

## 2024-06-28 ENCOUNTER — Telehealth: Payer: Self-pay | Admitting: Cardiology

## 2024-06-28 ENCOUNTER — Telehealth: Payer: Self-pay

## 2024-06-28 DIAGNOSIS — R262 Difficulty in walking, not elsewhere classified: Secondary | ICD-10-CM | POA: Diagnosis present

## 2024-06-28 DIAGNOSIS — R269 Unspecified abnormalities of gait and mobility: Secondary | ICD-10-CM | POA: Insufficient documentation

## 2024-06-28 DIAGNOSIS — R2689 Other abnormalities of gait and mobility: Secondary | ICD-10-CM | POA: Diagnosis present

## 2024-06-28 DIAGNOSIS — R278 Other lack of coordination: Secondary | ICD-10-CM | POA: Insufficient documentation

## 2024-06-28 DIAGNOSIS — R2681 Unsteadiness on feet: Secondary | ICD-10-CM | POA: Insufficient documentation

## 2024-06-28 DIAGNOSIS — M6281 Muscle weakness (generalized): Secondary | ICD-10-CM | POA: Diagnosis present

## 2024-06-28 NOTE — Telephone Encounter (Signed)
-----   Message from [place] sent at 06/26/2024  9:12 PM EDT ----- Regarding: RE: referral Oneil was in for a visit Friday. He is agreeable for an appt. Thought he had scheduled one, but apparently he scheduled a neurology appt.  I can place another order for referral if that is the best way to get an appt scheduled. He did state he does not answer numbers he does not recognize.  Let me know what I need to do to help.  Thank you for updating me and for reaching out. I appreciate it.   Dr Glendia ----- Message ----- From: Schools, Tinnie NOVAK Sent: 06/25/2024  10:59 AM EDT To: Allena Glendia, MD Subject: referral                                       Hello,  We have made several attempts to get this patient scheduled with no success. We sent the patient a letter and have removed them from the work queue.  Thank you,  [facility]

## 2024-06-28 NOTE — Therapy (Signed)
 OUTPATIENT PHYSICAL THERAPY NEURO TREATMENT   Patient Name: Gregory Crane MRN: [id number] DOB:04/09/1971, 53 y.o., male Today's Date: 06/28/2024  PCP: Glendia Shad  REFERRING PROVIDER: Glendia Shad  END OF SESSION:  PT End of Session - 06/28/24 1117     Visit Number 32    Number of Visits 48    Date for Recertification  08/16/24    Progress Note Due on Visit 20    PT Start Time 1111    PT Stop Time 1145    PT Time Calculation (min) 34 min    Equipment Utilized During Treatment Gait belt    Activity Tolerance Patient tolerated treatment well    Behavior During Therapy [facility] for tasks assessed/performed          Past Medical History:  Diagnosis Date   Allergy    Anemia    Bell's palsy    Diabetes mellitus without complication (HCC)    diet controlled   Fall 07/2023   Hypertension    Hypothyroidism    Kidney stones    Pseudotumor cerebri    Stroke [facility])    Past Surgical History:  Procedure Laterality Date   COLONOSCOPY WITH PROPOFOL  N/A 03/30/2020   Procedure: COLONOSCOPY WITH PROPOFOL ;  Surgeon: Maryruth Ole DASEN, MD;  Location: [facility] ENDOSCOPY;  Service: Endoscopy;  Laterality: N/A;   CORONARY STENT INTERVENTION N/A 03/17/2024   Procedure: CORONARY STENT INTERVENTION;  Surgeon: Jordan, Peter M, MD;  Location: [facility] INVASIVE CV LAB;  Service: Cardiovascular;  Laterality: N/A;   LEFT HEART CATH AND CORONARY ANGIOGRAPHY N/A 03/17/2024   Procedure: LEFT HEART CATH AND CORONARY ANGIOGRAPHY;  Surgeon: Jordan, Peter M, MD;  Location: [facility] INVASIVE CV LAB;  Service: Cardiovascular;  Laterality: N/A;   LOOP RECORDER INSERTION N/A 01/27/2018   Procedure: LOOP RECORDER INSERTION;  Surgeon: Fernande Elspeth BROCKS, MD;  Location: [facility] INVASIVE CV LAB;  Service: Cardiovascular;  Laterality: N/A;   LUMBAR PUNCTURE     as child   NO PAST SURGERIES     REMOVAL OF A DIALYSIS CATHETER     TEE WITHOUT CARDIOVERSION N/A 01/07/2018   Procedure: TRANSESOPHAGEAL ECHOCARDIOGRAM (TEE);   Surgeon: Perla Evalene PARAS, MD;  Location: [facility] ORS;  Service: Cardiovascular;  Laterality: N/A;   Patient Active Problem List   Diagnosis Date Noted   Bradycardia 04/30/2024   CAD S/P percutaneous coronary angioplasty 03/17/2024   Non-ST elevation (NSTEMI) myocardial infarction (HCC) 03/16/2024   Hypervolemia 03/14/2024   Hypoxic respiratory failure (HCC) 03/14/2024   Left hip pain 01/30/2024   SVT (supraventricular tachycardia) 08/10/2023   Atrial fibrillation (HCC) 07/03/2023   Atrial fibrillation with RVR (HCC) 07/02/2023   Fall 06/15/2023   Non-compliance with renal dialysis 05/02/2023   Volume overload 04/29/2023   Renal osteodystrophy 11/25/2022   Unsteady gait 10/05/2022   Steal syndrome of dialysis vascular access 05/28/2022   Hydronephrosis, left 05/04/2022   Leukocytosis 05/04/2022   Diabetic retinopathy associated with diabetes mellitus due to underlying condition (HCC) 01/13/2022   ESRD on hemodialysis (HCC) 01/13/2022   Deafness in right ear 08/11/2021   History of colon polyps 10/15/2020   Type 2 diabetes mellitus, with long-term current use of insulin  (HCC) 09/09/2020   Cryptogenic stroke (HCC) 07/13/2020   History of loop recorder 07/13/2020   Lymphedema 06/08/2019   Anemia 04/17/2019   Swelling of both lower extremities 01/10/2019   Facial droop 04/09/2018   Daytime somnolence 03/30/2018   Carotid artery disease 02/03/2018   Intracranial vascular stenosis 09/12/2017   History  of CVA (cerebrovascular accident) 11/10/2016   Benign localized hyperplasia of prostate with urinary obstruction 10/27/2016   History of nephrolithiasis 10/27/2016   TIA (transient ischemic attack) 10/20/2016   Near syncope 06/23/2016   Organic impotence 10/01/2015   Neuropathy 08/06/2015   Health care maintenance 08/06/2015   Hypertension 08/06/2015   Hypothyroidism 10/10/2013   Microalbuminuria 10/10/2013   Hyperlipidemia 10/10/2013   B12 deficiency 10/10/2013   Environmental  allergies 07/04/2013    ONSET DATE: 3 years ago  REFERRING DIAG: unsteady gait  THERAPY DIAG:  Muscle weakness (generalized)  Unsteadiness on feet  Other abnormalities of gait and mobility  Difficulty in walking, not elsewhere classified  Abnormality of gait and mobility  Other lack of coordination  Rationale for Evaluation and Treatment: Rehabilitation  SUBJECTIVE:                                                                                                                                                                                             SUBJECTIVE STATEMENT:   Pt reports he had an uneventful weekend.  Pt denies any falls or complications over the weekend.  Pt accompanied by: self  PERTINENT HISTORY: Patient returning to PT s/p hospitalization for pneumonia, , SVT, ESRD on hypodialysis. PMH DM, HTN, Anemia, Bells palsy, Hypothyroidism, Kidney stones, stroke and pseudotumour cerebri. Macular degeneration with impaired visual acuity   PAIN:  Are you having pain? Some L foot numbness/neuropathy  PRECAUTIONS: Fall  RED FLAGS: None   WEIGHT BEARING RESTRICTIONS: No  FALLS: Has patient fallen in last 6 months? No  LIVING ENVIRONMENT: Lives with: lives alone Lives in: House/apartment Stairs: yes stairs in house Has following equipment at home: Single point cane  PLOF: Independent with basic ADLs  PATIENT GOALS: balance, strength  OBJECTIVE:  Note: Objective measures were completed at Evaluation unless otherwise noted.  DIAGNOSTIC FINDINGS: IMPRESSION: Interval resolution of previously noted trace left parafalcine subdural hematoma. No acute intracranial process.  COGNITION: Overall cognitive status: Within functional limits for tasks assessed   SENSATION: WFL  COORDINATION: Heel slide: WFL RLE; slight decrease with LLE   MUSCLE TONE: WFL  POSTURE: rounded shoulders, forward head, and posterior pelvic tilt  LOWER EXTREMITY MMT:    MMT  Right Eval Left Eval  Hip flexion 4 4  Hip extension    Hip abduction 3 3  Hip adduction 3+ 3+  Hip internal rotation    Hip external rotation    Knee flexion 4- 4-  Knee extension 4 4  Ankle dorsiflexion 3 2+  Ankle plantarflexion 3+ 3  Ankle inversion    Ankle eversion    (Blank  rows = not tested)  BED MOBILITY:  Not tested  TRANSFERS: Sit to stand: CGA  Assistive device utilized: None     Stand to sit: CGA  Assistive device utilized: None     Chair to chair: CGA  Assistive device utilized: None       RAMP:  Not tested  CURB:  Findings: needs UE support to step up  STAIRS: heavy BUE support step over step negotiation.  GAIT: Findings: Gait Characteristics: decreased stride length, Left foot flat, wide BOS, and poor foot clearance- Left, Distance walked: 60 ft, Assistive device utilized:None, Level of assistance: CGA, and Comments: increased L ankle rolling due to not wearing AFO  FUNCTIONAL TESTS:  5 times sit to stand: 30.53 with 4 posterior LOB 6 minute walk test: perform next session  Berg Balance Scale: 26  PATIENT SURVEYS:  LEFS give next session                                                                                                                               TREATMENT DATE: 06/28/24   TherAct: To improve functional movements patterns for everyday tasks   Cable column walkouts with use of the // bars for safety, forward/latera/backward, 17.5#, x5 each direction, CGA and intermittent UE use throughout    STS x10 with verbal cues for reduced speed to prevent backwards unsteadiness     PATIENT EDUCATION: Education details: goals, POC, HEP Person educated: Patient Education method: Explanation, Demonstration, Tactile cues, Verbal cues, and Handouts Education comprehension: verbalized understanding, returned demonstration, verbal cues required, tactile cues required, and needs further education  HOME EXERCISE PROGRAM: Access Code:  [email or web address] URL: [email or web address] Date: 05/26/2024 Prepared by: Sidra Simpers  Exercises - Leg Extension  - 1 x daily - 7 x weekly - 2 sets - 10 reps - 5 hold - Seated March  - 1 x daily - 7 x weekly - 2 sets - 10 reps - 5 hold - Seated Heel Raise  - 1 x daily - 7 x weekly - 2 sets - 10 reps - 5 hold - Sit to Stand  - 1 x daily - 7 x weekly - 3 sets - 10 reps - Mini Squat with Counter Support  - 1 x daily - 7 x weekly - 3 sets - 10 reps - Standing 4-Way Leg Reach with Counter Support  - 1 x daily - 7 x weekly - 3 sets - 10 reps  GOALS: Goals reviewed with patient? Yes  SHORT TERM GOALS: Target date: 01/26/2024  Patient will be independent in home exercise program to improve strength/mobility for better functional independence with ADLs.  Baseline: Goal status: INITIAL  LONG TERM GOALS: Target date: 03/22/2024  Patient (< 90 years old) will complete five times sit to stand test in < 10 seconds indicating an increased LE strength and improved balance.  Baseline: 30.53 with 4 posterior LOB 03/29/24: 22.61 with 3 posterior LOB 06/21/24: 21.7 with  2 posterior LOB Goal status: PROGRESSING  2.   Patient will increase Berg Balance score by >45/56 to demonstrate decreased fall risk during functional activities.  Baseline: 5/5: 26  03/29/24: 32 Goal status: INITIAL  3.    Patient will increase six minute walk test distance to >1200 for progression to age norm community ambulator and improve gait ability  Baseline: perform next session 5/12: 710 ft  03/29/24: 770' 06/18/24: 792' Goal status: PROGRESSING  4.  Patient will score > 60/80 on LEFS for improved functional mobility and quality of life.  Baseline: perform next session 5/12: 43 03/29/24: 39/80 06/21/24: 38/80 Goal status: INITIAL    ASSESSMENT:  CLINICAL IMPRESSION:   Pt responded well to the exercises and was able to perform the resisted cable column walk-outs with intermittent use of the UE's on the // bars.   Pt ultimately had increased difficulty with lateral ambulation due to the weakness in the hips, but was able to complete and perform with good technique utilizing CGA and verbal cues for not crossing the LE's over.  Session limited by pt's late arrival due to transportation complications.   Pt will continue to benefit from skilled therapy to address remaining deficits in order to improve overall QoL and return to PLOF.        OBJECTIVE IMPAIRMENTS: Abnormal gait, cardiopulmonary status limiting activity, decreased activity tolerance, decreased balance, decreased coordination, decreased endurance, decreased knowledge of use of DME, decreased mobility, difficulty walking, decreased ROM, decreased strength, dizziness, impaired perceived functional ability, impaired flexibility, impaired vision/preception, improper body mechanics, postural dysfunction, and obesity.   ACTIVITY LIMITATIONS: carrying, lifting, bending, sitting, standing, squatting, stairs, transfers, bed mobility, toileting, dressing, reach over head, hygiene/grooming, locomotion level, and caring for others  PARTICIPATION LIMITATIONS: meal prep, cleaning, laundry, driving, shopping, community activity, occupation, and yard work  PERSONAL FACTORS: Age, Behavior pattern, Fitness, Past/current experiences, Time since onset of injury/illness/exacerbation, Transportation, and 3+ comorbidities: DM, HTN, Anemia, Bells palsy, Hypothyroidism, Kidney stones, stroke and pseudotumour cerebri. Macular degeneration with impaired visual acuity  are also affecting patient's functional outcome.   REHAB POTENTIAL: Good  CLINICAL DECISION MAKING: Evolving/moderate complexity  EVALUATION COMPLEXITY: Moderate  PLAN:  PT FREQUENCY: 2x/week  PT DURATION: 12 weeks  PLANNED INTERVENTIONS: 97164- PT Re-evaluation, 97750- Physical Performance Testing, 97110-Therapeutic exercises, 97530- Therapeutic activity, [id number]- Neuromuscular re-education, 97535- Self  Care, 02859- Manual therapy, [id number]- Gait training, [id number]- Orthotic Initial, [phone]- Orthotic/Prosthetic subsequent, [phone]- Canalith repositioning, [id number]- Splinting, H9716- Electrical stimulation (unattended), [phone]- Electrical stimulation (manual), [id number]- Vasopneumatic device, [id number]- Ultrasound, [id number]- Traction (mechanical), [id number]- Ionotophoresis 4mg /ml Dexamethasone , Patient/Family education, Balance training, Stair training, Taping, Dry Needling, Joint mobilization, Spinal mobilization, Scar mobilization, Compression bandaging, Vestibular training, Visual/preceptual remediation/compensation, Cognitive remediation, DME instructions, Cryotherapy, Moist heat, and Biofeedback  PLAN FOR NEXT SESSION:  Blaze Pods, Korebalance balance, strength, L ankle strength, stairs, LE endurance   Fonda Simpers, PT, DPT Physical Therapist - [facility]  06/28/24, 12:34 PM

## 2024-06-28 NOTE — Addendum Note (Signed)
 Addended by: SILVER FONDA ORN on: 06/28/2024 11:16 AM   Modules accepted: Orders

## 2024-06-28 NOTE — Telephone Encounter (Signed)
 Are you taking over the prescriptions pt is needing? Filled by different provider

## 2024-06-28 NOTE — Telephone Encounter (Signed)
 Left voice mail to schedule appt

## 2024-06-28 NOTE — Telephone Encounter (Signed)
 Patient wants provider switch from Dr. Kate to Dr. Argentina.

## 2024-06-28 NOTE — Telephone Encounter (Signed)
 OK with me Thayer Ohm

## 2024-06-29 MED ORDER — APIXABAN 5 MG PO TABS: 5.0000 mg | ORAL_TABLET | Freq: Two times a day (BID) | ORAL | 1 refills | Status: DC

## 2024-06-29 MED ORDER — FUROSEMIDE 80 MG PO TABS: 80.0000 mg | ORAL_TABLET | Freq: Every day | ORAL | 1 refills | Status: DC

## 2024-06-29 MED ORDER — LEVOTHYROXINE SODIUM 175 MCG PO TABS: ORAL_TABLET | ORAL | 1 refills | Status: DC

## 2024-06-29 MED ORDER — METOPROLOL TARTRATE 37.5 MG PO TABS: 37.5000 mg | ORAL_TABLET | Freq: Two times a day (BID) | ORAL | 1 refills | Status: DC

## 2024-06-29 MED ORDER — CLOPIDOGREL BISULFATE 75 MG PO TABS: 75.0000 mg | ORAL_TABLET | Freq: Every day | ORAL | 2 refills | Status: DC

## 2024-06-29 MED ORDER — HYDRALAZINE HCL 25 MG PO TABS: 25.0000 mg | ORAL_TABLET | Freq: Two times a day (BID) | ORAL | 1 refills | Status: DC

## 2024-06-29 NOTE — Telephone Encounter (Signed)
 Ok to refill eliquis , plavix , lasix , hydralazine , levothyroxine  and metoprolol . Regarding the amiodarone , will need to f/u with cardiology.  Please confirm if he has an appt scheduled with cardiology. They have been trying to contact him.

## 2024-06-30 ENCOUNTER — Telehealth: Payer: Self-pay

## 2024-06-30 ENCOUNTER — Ambulatory Visit

## 2024-06-30 ENCOUNTER — Ambulatory Visit: Payer: Medicare Other

## 2024-06-30 ENCOUNTER — Ambulatory Visit: Admitting: Cardiology

## 2024-06-30 ENCOUNTER — Other Ambulatory Visit: Payer: Self-pay | Admitting: *Deleted

## 2024-06-30 DIAGNOSIS — E039 Hypothyroidism, unspecified: Secondary | ICD-10-CM

## 2024-06-30 MED ORDER — LEVOTHYROXINE SODIUM 200 MCG PO TABS: ORAL_TABLET | ORAL | 1 refills | Status: DC

## 2024-06-30 NOTE — Telephone Encounter (Signed)
 Copied from CRM [id number]. Topic: Appointments - Scheduling Inquiry for Clinic >> Jun 30, 2024 11:22 AM Harlene ORN wrote: Reason for CRM:  Patient called. Needs to reschedule his AWV with Dr. Glendia that was set for 06/30/2024 @ 1:00 pm to a later date. Please call back the patient to reschedule.

## 2024-06-30 NOTE — Addendum Note (Signed)
 Addended by: BRIEN SHARENE RAMAN on: 06/30/2024 02:37 PM   Modules accepted: Orders

## 2024-06-30 NOTE — Telephone Encounter (Signed)
 Lm for patient to call back and reschedule his Medicare AWV.

## 2024-06-30 NOTE — Telephone Encounter (Signed)
 The was scheduled with health coach, not PCP. And is already cancelled. This note just needs to go to admin pool to reschedule

## 2024-07-01 ENCOUNTER — Telehealth: Payer: Self-pay

## 2024-07-01 NOTE — Progress Notes (Unsigned)
 Complex Care Management Note Care Guide Note  07/01/2024 Name: Gregory Crane MRN: [id number] DOB: 1971-04-23   Complex Care Management Outreach Attempts: An unsuccessful telephone outreach was attempted today to offer the patient information about available complex care management services.  Follow Up Plan:  Additional outreach attempts will be made to offer the patient complex care management information and services.   Encounter Outcome:  No Answer  Dreama [facility]  [facility], [facility] VBCI Assistant Direct Dial: [phone]  Fax: [phone]

## 2024-07-02 NOTE — Progress Notes (Signed)
 Complex Care Management Note  Care Guide Note 07/02/2024 Name: Gregory Crane MRN: [id number] DOB: 05-18-1971  Gregory Crane is a 53 y.o. year old male who sees Glendia Shad, MD for primary care. I reached out to Norleen Oneil Beckstrand by phone today to offer complex care management services.  [name] was given information about Complex Care Management services today including:   The Complex Care Management services include support from the care team which includes your Nurse Care Manager, Clinical Social Worker, or Pharmacist.  The Complex Care Management team is here to help remove barriers to the health concerns and goals most important to you. Complex Care Management services are voluntary, and the patient may decline or stop services at any time by request to their care team member.   Complex Care Management Consent Status: Patient agreed to services and verbal consent obtained.   Follow up plan:  Telephone appointment with complex care management team member scheduled for:  07/09/24 & 07/30/24.  Encounter Outcome:  Patient Scheduled  Dreama [facility]  [facility], [facility] VBCI Assistant Direct Dial: [phone]  Fax: [phone]

## 2024-07-05 ENCOUNTER — Ambulatory Visit

## 2024-07-05 DIAGNOSIS — M6281 Muscle weakness (generalized): Secondary | ICD-10-CM

## 2024-07-05 DIAGNOSIS — R2689 Other abnormalities of gait and mobility: Secondary | ICD-10-CM

## 2024-07-05 DIAGNOSIS — R262 Difficulty in walking, not elsewhere classified: Secondary | ICD-10-CM

## 2024-07-05 DIAGNOSIS — R278 Other lack of coordination: Secondary | ICD-10-CM

## 2024-07-05 DIAGNOSIS — R269 Unspecified abnormalities of gait and mobility: Secondary | ICD-10-CM

## 2024-07-05 DIAGNOSIS — R2681 Unsteadiness on feet: Secondary | ICD-10-CM

## 2024-07-05 NOTE — Therapy (Signed)
 OUTPATIENT PHYSICAL THERAPY NEURO TREATMENT   Patient Name: Gregory Crane MRN: [id number] DOB:27-Dec-1970, 53 y.o., male Today's Date: 07/05/2024  PCP: Glendia Shad  REFERRING PROVIDER: Glendia Shad  END OF SESSION:  PT End of Session - 07/05/24 1158     Visit Number 33    Number of Visits 48    Date for Recertification  08/16/24    Progress Note Due on Visit 20    PT Start Time 1119    PT Stop Time 1200    PT Time Calculation (min) 41 min    Equipment Utilized During Treatment Gait belt    Activity Tolerance Patient tolerated treatment well    Behavior During Therapy [facility] for tasks assessed/performed          Past Medical History:  Diagnosis Date   Allergy    Anemia    Bell's palsy    Diabetes mellitus without complication (HCC)    diet controlled   Fall 07/2023   Hypertension    Hypothyroidism    Kidney stones    Pseudotumor cerebri    Stroke [facility])    Past Surgical History:  Procedure Laterality Date   COLONOSCOPY WITH PROPOFOL  N/A 03/30/2020   Procedure: COLONOSCOPY WITH PROPOFOL ;  Surgeon: Maryruth Ole DASEN, MD;  Location: [facility] ENDOSCOPY;  Service: Endoscopy;  Laterality: N/A;   CORONARY STENT INTERVENTION N/A 03/17/2024   Procedure: CORONARY STENT INTERVENTION;  Surgeon: Jordan, Peter M, MD;  Location: [facility] INVASIVE CV LAB;  Service: Cardiovascular;  Laterality: N/A;   LEFT HEART CATH AND CORONARY ANGIOGRAPHY N/A 03/17/2024   Procedure: LEFT HEART CATH AND CORONARY ANGIOGRAPHY;  Surgeon: Jordan, Peter M, MD;  Location: [facility] INVASIVE CV LAB;  Service: Cardiovascular;  Laterality: N/A;   LOOP RECORDER INSERTION N/A 01/27/2018   Procedure: LOOP RECORDER INSERTION;  Surgeon: Fernande Elspeth BROCKS, MD;  Location: [facility] INVASIVE CV LAB;  Service: Cardiovascular;  Laterality: N/A;   LUMBAR PUNCTURE     as child   NO PAST SURGERIES     REMOVAL OF A DIALYSIS CATHETER     TEE WITHOUT CARDIOVERSION N/A 01/07/2018   Procedure: TRANSESOPHAGEAL ECHOCARDIOGRAM (TEE);   Surgeon: Perla Evalene PARAS, MD;  Location: [facility] ORS;  Service: Cardiovascular;  Laterality: N/A;   Patient Active Problem List   Diagnosis Date Noted   Bradycardia 04/30/2024   CAD S/P percutaneous coronary angioplasty 03/17/2024   Non-ST elevation (NSTEMI) myocardial infarction (HCC) 03/16/2024   Hypervolemia 03/14/2024   Hypoxic respiratory failure (HCC) 03/14/2024   Left hip pain 01/30/2024   SVT (supraventricular tachycardia) 08/10/2023   Atrial fibrillation (HCC) 07/03/2023   Atrial fibrillation with RVR (HCC) 07/02/2023   Fall 06/15/2023   Non-compliance with renal dialysis 05/02/2023   Volume overload 04/29/2023   Renal osteodystrophy 11/25/2022   Unsteady gait 10/05/2022   Steal syndrome of dialysis vascular access 05/28/2022   Hydronephrosis, left 05/04/2022   Leukocytosis 05/04/2022   Diabetic retinopathy associated with diabetes mellitus due to underlying condition (HCC) 01/13/2022   ESRD on hemodialysis (HCC) 01/13/2022   Deafness in right ear 08/11/2021   History of colon polyps 10/15/2020   Type 2 diabetes mellitus, with long-term current use of insulin  (HCC) 09/09/2020   Cryptogenic stroke (HCC) 07/13/2020   History of loop recorder 07/13/2020   Lymphedema 06/08/2019   Anemia 04/17/2019   Swelling of both lower extremities 01/10/2019   Facial droop 04/09/2018   Daytime somnolence 03/30/2018   Carotid artery disease 02/03/2018   Intracranial vascular stenosis 09/12/2017   History  of CVA (cerebrovascular accident) 11/10/2016   Benign localized hyperplasia of prostate with urinary obstruction 10/27/2016   History of nephrolithiasis 10/27/2016   TIA (transient ischemic attack) 10/20/2016   Near syncope 06/23/2016   Organic impotence 10/01/2015   Neuropathy 08/06/2015   Health care maintenance 08/06/2015   Hypertension 08/06/2015   Hypothyroidism 10/10/2013   Microalbuminuria 10/10/2013   Hyperlipidemia 10/10/2013   B12 deficiency 10/10/2013   Environmental  allergies 07/04/2013    ONSET DATE: 3 years ago  REFERRING DIAG: unsteady gait  THERAPY DIAG:  Muscle weakness (generalized)  Unsteadiness on feet  Other abnormalities of gait and mobility  Difficulty in walking, not elsewhere classified  Abnormality of gait and mobility  Other lack of coordination  Rationale for Evaluation and Treatment: Rehabilitation  SUBJECTIVE:                                                                                                                                                                                             SUBJECTIVE STATEMENT:   Pt reports he had an uneventful weekend.  Pt denies any falls or complications over the weekend.  Pt accompanied by: self  PERTINENT HISTORY: Patient returning to PT s/p hospitalization for pneumonia, , SVT, ESRD on hypodialysis. PMH DM, HTN, Anemia, Bells palsy, Hypothyroidism, Kidney stones, stroke and pseudotumour cerebri. Macular degeneration with impaired visual acuity   PAIN:  Are you having pain? Some L foot numbness/neuropathy  PRECAUTIONS: Fall  RED FLAGS: None   WEIGHT BEARING RESTRICTIONS: No  FALLS: Has patient fallen in last 6 months? No  LIVING ENVIRONMENT: Lives with: lives alone Lives in: House/apartment Stairs: yes stairs in house Has following equipment at home: Single point cane  PLOF: Independent with basic ADLs  PATIENT GOALS: balance, strength  OBJECTIVE:  Note: Objective measures were completed at Evaluation unless otherwise noted.  DIAGNOSTIC FINDINGS: IMPRESSION: Interval resolution of previously noted trace left parafalcine subdural hematoma. No acute intracranial process.  COGNITION: Overall cognitive status: Within functional limits for tasks assessed   SENSATION: WFL  COORDINATION: Heel slide: WFL RLE; slight decrease with LLE   MUSCLE TONE: WFL  POSTURE: rounded shoulders, forward head, and posterior pelvic tilt  LOWER EXTREMITY MMT:    MMT  Right Eval Left Eval  Hip flexion 4 4  Hip extension    Hip abduction 3 3  Hip adduction 3+ 3+  Hip internal rotation    Hip external rotation    Knee flexion 4- 4-  Knee extension 4 4  Ankle dorsiflexion 3 2+  Ankle plantarflexion 3+ 3  Ankle inversion    Ankle eversion    (Blank  rows = not tested)  BED MOBILITY:  Not tested  TRANSFERS: Sit to stand: CGA  Assistive device utilized: None     Stand to sit: CGA  Assistive device utilized: None     Chair to chair: CGA  Assistive device utilized: None       RAMP:  Not tested  CURB:  Findings: needs UE support to step up  STAIRS: heavy BUE support step over step negotiation.  GAIT: Findings: Gait Characteristics: decreased stride length, Left foot flat, wide BOS, and poor foot clearance- Left, Distance walked: 60 ft, Assistive device utilized:None, Level of assistance: CGA, and Comments: increased L ankle rolling due to not wearing AFO  FUNCTIONAL TESTS:  5 times sit to stand: 30.53 with 4 posterior LOB 6 minute walk test: perform next session  Berg Balance Scale: 26  PATIENT SURVEYS:  LEFS give next session                                                                                                                               TREATMENT DATE: 07/05/24   TherAct: To improve functional movements patterns for everyday tasks  The patient completed 10 minutes at level(s) 3-8 on the [facility] using both BUE/BLE reciprocal movements to promote strength, endurance, and cardiorespiratory fitness. PT increased the resistance level and monitored the patient's response to the intervention throughout. The patient required min cueing for technique and supervision level of assistance.     Neuro:  Patient participated in balance training using the [employer] game on the KoreBalanceT platform for 10 minutes at stability level 4.0. The task involved navigating a digital ball across a screen and shifting the center of  gravity on a dynamic platform in order to break bricks. This activity targets postural control, weight shifting, proprioception, and visual-motor coordination, and promotes core stability and reactive balance strategies. The patient required minimal level of assistance and minimal verbal cueing.   The patient participated in the KoreMaze game on the [place] platform for 8 minutes at stability level 4.0. This game challenges dynamic balance, weight shifting, and spatial navigation by requiring the patient to maneuver through a maze using controlled body movements on the balance board. The activity promotes postural control, motor planning, coordination, and visual-motor integration. The patient performed the task with  minimal assistance, and with minimal verbal cueing.    Backwards ambulation length of the hallway x2  Lateral ambulation for increased balance and stability, length of hallway x2 each direction       PATIENT EDUCATION: Education details: goals, POC, HEP Person educated: Patient Education method: Explanation, Demonstration, Tactile cues, Verbal cues, and Handouts Education comprehension: verbalized understanding, returned demonstration, verbal cues required, tactile cues required, and needs further education  HOME EXERCISE PROGRAM: Access Code: [email or web address] URL: [email or web address] Date: 05/26/2024 Prepared by: Sidra Simpers  Exercises - Leg Extension  - 1 x daily - 7 x weekly - 2 sets - 10 reps - 5  hold - Seated March  - 1 x daily - 7 x weekly - 2 sets - 10 reps - 5 hold - Seated Heel Raise  - 1 x daily - 7 x weekly - 2 sets - 10 reps - 5 hold - Sit to Stand  - 1 x daily - 7 x weekly - 3 sets - 10 reps - Mini Squat with Counter Support  - 1 x daily - 7 x weekly - 3 sets - 10 reps - Standing 4-Way Leg Reach with Counter Support  - 1 x daily - 7 x weekly - 3 sets - 10 reps  GOALS: Goals reviewed with patient? Yes  SHORT TERM GOALS: Target date:  01/26/2024  Patient will be independent in home exercise program to improve strength/mobility for better functional independence with ADLs.  Baseline: Goal status: INITIAL  LONG TERM GOALS: Target date: 03/22/2024  Patient (< 32 years old) will complete five times sit to stand test in < 10 seconds indicating an increased LE strength and improved balance.  Baseline: 30.53 with 4 posterior LOB 03/29/24: 22.61 with 3 posterior LOB 06/21/24: 21.7 with 2 posterior LOB Goal status: PROGRESSING  2.   Patient will increase Berg Balance score by >45/56 to demonstrate decreased fall risk during functional activities.  Baseline: 5/5: 26  03/29/24: 32 Goal status: INITIAL  3.    Patient will increase six minute walk test distance to >1200 for progression to age norm community ambulator and improve gait ability  Baseline: perform next session 5/12: 710 ft  03/29/24: 770' 06/18/24: 792' Goal status: PROGRESSING  4.  Patient will score > 60/80 on LEFS for improved functional mobility and quality of life.  Baseline: perform next session 5/12: 43 03/29/24: 39/80 06/21/24: 38/80 Goal status: INITIAL    ASSESSMENT:  CLINICAL IMPRESSION:   Pt responded well to the exercises and was able to tolerate the balance training that took place during the session.  Pt was able to demonstrate improved technique on the KoreBalance than in previous sessions.  Pt ultimately is limited again due to eyesight at times, but was a better day for his eyesight and he was able to complete the tasks given.  Pt continues to benefit from balance training along with techniques to improve his overall gait pattern.   Pt will continue to benefit from skilled therapy to address remaining deficits in order to improve overall QoL and return to PLOF.          OBJECTIVE IMPAIRMENTS: Abnormal gait, cardiopulmonary status limiting activity, decreased activity tolerance, decreased balance, decreased coordination, decreased endurance,  decreased knowledge of use of DME, decreased mobility, difficulty walking, decreased ROM, decreased strength, dizziness, impaired perceived functional ability, impaired flexibility, impaired vision/preception, improper body mechanics, postural dysfunction, and obesity.   ACTIVITY LIMITATIONS: carrying, lifting, bending, sitting, standing, squatting, stairs, transfers, bed mobility, toileting, dressing, reach over head, hygiene/grooming, locomotion level, and caring for others  PARTICIPATION LIMITATIONS: meal prep, cleaning, laundry, driving, shopping, community activity, occupation, and yard work  PERSONAL FACTORS: Age, Behavior pattern, Fitness, Past/current experiences, Time since onset of injury/illness/exacerbation, Transportation, and 3+ comorbidities: DM, HTN, Anemia, Bells palsy, Hypothyroidism, Kidney stones, stroke and pseudotumour cerebri. Macular degeneration with impaired visual acuity  are also affecting patient's functional outcome.   REHAB POTENTIAL: Good  CLINICAL DECISION MAKING: Evolving/moderate complexity  EVALUATION COMPLEXITY: Moderate  PLAN:  PT FREQUENCY: 2x/week  PT DURATION: 12 weeks  PLANNED INTERVENTIONS: 97164- PT Re-evaluation, 97750- Physical Performance Testing, 97110-Therapeutic exercises, 97530- Therapeutic activity, 97112-  Neuromuscular re-education, [id number]- Self Care, 02859- Manual therapy, [id number]- Gait training, [id number]- Orthotic Initial, [id number]- Orthotic/Prosthetic subsequent, [id number]- Canalith repositioning, [id number]- Splinting, [phone]- Electrical stimulation (unattended), [phone]- Electrical stimulation (manual), [id number]- Vasopneumatic device, [id number]- Ultrasound, [id number]- Traction (mechanical), [phone]- Ionotophoresis 4mg /ml Dexamethasone , Patient/Family education, Balance training, Stair training, Taping, Dry Needling, Joint mobilization, Spinal mobilization, Scar mobilization, Compression bandaging, Vestibular training, Visual/preceptual remediation/compensation, Cognitive  remediation, DME instructions, Cryotherapy, Moist heat, and Biofeedback  PLAN FOR NEXT SESSION:   Blaze Pods, Korebalance balance, strength, L ankle strength, stairs, LE endurance   Fonda Simpers, PT, DPT Physical Therapist - [place]  [facility]  07/05/24, 9:17 PM

## 2024-07-07 ENCOUNTER — Ambulatory Visit

## 2024-07-07 DIAGNOSIS — M6281 Muscle weakness (generalized): Secondary | ICD-10-CM | POA: Diagnosis not present

## 2024-07-07 DIAGNOSIS — R262 Difficulty in walking, not elsewhere classified: Secondary | ICD-10-CM

## 2024-07-07 DIAGNOSIS — R278 Other lack of coordination: Secondary | ICD-10-CM

## 2024-07-07 DIAGNOSIS — R2681 Unsteadiness on feet: Secondary | ICD-10-CM

## 2024-07-07 DIAGNOSIS — R269 Unspecified abnormalities of gait and mobility: Secondary | ICD-10-CM

## 2024-07-07 DIAGNOSIS — R2689 Other abnormalities of gait and mobility: Secondary | ICD-10-CM

## 2024-07-07 NOTE — Therapy (Signed)
 OUTPATIENT PHYSICAL THERAPY NEURO TREATMENT   Patient Name: Gregory Crane MRN: [id number] DOB:1971/04/13, 53 y.o., male Today's Date: 07/07/2024  PCP: Glendia Shad  REFERRING PROVIDER: Glendia Shad  END OF SESSION:  PT End of Session - 07/07/24 1202     Visit Number 34    Number of Visits 48    Date for Recertification  08/16/24    Progress Note Due on Visit 20    PT Start Time 1145    PT Stop Time 1230    PT Time Calculation (min) 45 min    Equipment Utilized During Treatment Gait belt    Activity Tolerance Patient tolerated treatment well    Behavior During Therapy [facility] for tasks assessed/performed           Past Medical History:  Diagnosis Date   Allergy    Anemia    Bell's palsy    Diabetes mellitus without complication (HCC)    diet controlled   Fall 07/2023   Hypertension    Hypothyroidism    Kidney stones    Pseudotumor cerebri    Stroke [facility])    Past Surgical History:  Procedure Laterality Date   COLONOSCOPY WITH PROPOFOL  N/A 03/30/2020   Procedure: COLONOSCOPY WITH PROPOFOL ;  Surgeon: Maryruth Ole DASEN, MD;  Location: [facility] ENDOSCOPY;  Service: Endoscopy;  Laterality: N/A;   CORONARY STENT INTERVENTION N/A 03/17/2024   Procedure: CORONARY STENT INTERVENTION;  Surgeon: Jordan, Peter M, MD;  Location: [facility] INVASIVE CV LAB;  Service: Cardiovascular;  Laterality: N/A;   LEFT HEART CATH AND CORONARY ANGIOGRAPHY N/A 03/17/2024   Procedure: LEFT HEART CATH AND CORONARY ANGIOGRAPHY;  Surgeon: Jordan, Peter M, MD;  Location: [facility] INVASIVE CV LAB;  Service: Cardiovascular;  Laterality: N/A;   LOOP RECORDER INSERTION N/A 01/27/2018   Procedure: LOOP RECORDER INSERTION;  Surgeon: Fernande Elspeth BROCKS, MD;  Location: [facility] INVASIVE CV LAB;  Service: Cardiovascular;  Laterality: N/A;   LUMBAR PUNCTURE     as child   NO PAST SURGERIES     REMOVAL OF A DIALYSIS CATHETER     TEE WITHOUT CARDIOVERSION N/A 01/07/2018   Procedure: TRANSESOPHAGEAL ECHOCARDIOGRAM (TEE);   Surgeon: Perla Evalene PARAS, MD;  Location: [facility] ORS;  Service: Cardiovascular;  Laterality: N/A;   Patient Active Problem List   Diagnosis Date Noted   Bradycardia 04/30/2024   CAD S/P percutaneous coronary angioplasty 03/17/2024   Non-ST elevation (NSTEMI) myocardial infarction (HCC) 03/16/2024   Hypervolemia 03/14/2024   Hypoxic respiratory failure (HCC) 03/14/2024   Left hip pain 01/30/2024   SVT (supraventricular tachycardia) 08/10/2023   Atrial fibrillation (HCC) 07/03/2023   Atrial fibrillation with RVR (HCC) 07/02/2023   Fall 06/15/2023   Non-compliance with renal dialysis 05/02/2023   Volume overload 04/29/2023   Renal osteodystrophy 11/25/2022   Unsteady gait 10/05/2022   Steal syndrome of dialysis vascular access 05/28/2022   Hydronephrosis, left 05/04/2022   Leukocytosis 05/04/2022   Diabetic retinopathy associated with diabetes mellitus due to underlying condition (HCC) 01/13/2022   ESRD on hemodialysis (HCC) 01/13/2022   Deafness in right ear 08/11/2021   History of colon polyps 10/15/2020   Type 2 diabetes mellitus, with long-term current use of insulin  (HCC) 09/09/2020   Cryptogenic stroke (HCC) 07/13/2020   History of loop recorder 07/13/2020   Lymphedema 06/08/2019   Anemia 04/17/2019   Swelling of both lower extremities 01/10/2019   Facial droop 04/09/2018   Daytime somnolence 03/30/2018   Carotid artery disease 02/03/2018   Intracranial vascular stenosis 09/12/2017  History of CVA (cerebrovascular accident) 11/10/2016   Benign localized hyperplasia of prostate with urinary obstruction 10/27/2016   History of nephrolithiasis 10/27/2016   TIA (transient ischemic attack) 10/20/2016   Near syncope 06/23/2016   Organic impotence 10/01/2015   Neuropathy 08/06/2015   Health care maintenance 08/06/2015   Hypertension 08/06/2015   Hypothyroidism 10/10/2013   Microalbuminuria 10/10/2013   Hyperlipidemia 10/10/2013   B12 deficiency 10/10/2013   Environmental  allergies 07/04/2013    ONSET DATE: 3 years ago  REFERRING DIAG: unsteady gait  THERAPY DIAG:  Muscle weakness (generalized)  Unsteadiness on feet  Other abnormalities of gait and mobility  Difficulty in walking, not elsewhere classified  Abnormality of gait and mobility  Other lack of coordination  Rationale for Evaluation and Treatment: Rehabilitation  SUBJECTIVE:                                                                                                                                                                                             SUBJECTIVE STATEMENT:   Pt denies any new complaints.  Pt states he has a different pair of hoes on today that he forgot he had.  Pt much more stable in these shoes compared to the last pair that he purchased.    Pt accompanied by: self  PERTINENT HISTORY: Patient returning to PT s/p hospitalization for pneumonia, , SVT, ESRD on hypodialysis. PMH DM, HTN, Anemia, Bells palsy, Hypothyroidism, Kidney stones, stroke and pseudotumour cerebri. Macular degeneration with impaired visual acuity   PAIN:  Are you having pain? Some L foot numbness/neuropathy  PRECAUTIONS: Fall  RED FLAGS: None   WEIGHT BEARING RESTRICTIONS: No  FALLS: Has patient fallen in last 6 months? No  LIVING ENVIRONMENT: Lives with: lives alone Lives in: House/apartment Stairs: yes stairs in house Has following equipment at home: Single point cane  PLOF: Independent with basic ADLs  PATIENT GOALS: balance, strength  OBJECTIVE:  Note: Objective measures were completed at Evaluation unless otherwise noted.  DIAGNOSTIC FINDINGS: IMPRESSION: Interval resolution of previously noted trace left parafalcine subdural hematoma. No acute intracranial process.  COGNITION: Overall cognitive status: Within functional limits for tasks assessed   SENSATION: WFL  COORDINATION: Heel slide: WFL RLE; slight decrease with LLE   MUSCLE TONE: WFL  POSTURE:  rounded shoulders, forward head, and posterior pelvic tilt  LOWER EXTREMITY MMT:    MMT Right Eval Left Eval  Hip flexion 4 4  Hip extension    Hip abduction 3 3  Hip adduction 3+ 3+  Hip internal rotation    Hip external rotation    Knee flexion 4- 4-  Knee extension  4 4  Ankle dorsiflexion 3 2+  Ankle plantarflexion 3+ 3  Ankle inversion    Ankle eversion    (Blank rows = not tested)  BED MOBILITY:  Not tested  TRANSFERS: Sit to stand: CGA  Assistive device utilized: None     Stand to sit: CGA  Assistive device utilized: None     Chair to chair: CGA  Assistive device utilized: None       RAMP:  Not tested  CURB:  Findings: needs UE support to step up  STAIRS: heavy BUE support step over step negotiation.  GAIT: Findings: Gait Characteristics: decreased stride length, Left foot flat, wide BOS, and poor foot clearance- Left, Distance walked: 60 ft, Assistive device utilized:None, Level of assistance: CGA, and Comments: increased L ankle rolling due to not wearing AFO  FUNCTIONAL TESTS:  5 times sit to stand: 30.53 with 4 posterior LOB 6 minute walk test: perform next session  Berg Balance Scale: 26  PATIENT SURVEYS:  LEFS give next session                                                                                                                               TREATMENT DATE: 07/07/24    TherAct: To improve functional movements patterns for everyday tasks  Cable column walkouts, forward/latera/backward, 12.5#, x5 each direction in // bars for safety    Gait:  Ambulation within the // bars with intermittent UE support on the bars, stepping over 4 half foam rollers, x5 total  (down & back)  Lateral ambulation within the // bars with intermittent UE support on the bars, stepping over 4 half foam rollers, x5 total laps (down & back)       PATIENT EDUCATION: Education details: goals, POC, HEP Person educated: Patient Education method:  Explanation, Demonstration, Tactile cues, Verbal cues, and Handouts Education comprehension: verbalized understanding, returned demonstration, verbal cues required, tactile cues required, and needs further education  HOME EXERCISE PROGRAM: Access Code: [email or web address] URL: [email or web address] Date: 05/26/2024 Prepared by: Sidra Simpers  Exercises - Leg Extension  - 1 x daily - 7 x weekly - 2 sets - 10 reps - 5 hold - Seated March  - 1 x daily - 7 x weekly - 2 sets - 10 reps - 5 hold - Seated Heel Raise  - 1 x daily - 7 x weekly - 2 sets - 10 reps - 5 hold - Sit to Stand  - 1 x daily - 7 x weekly - 3 sets - 10 reps - Mini Squat with Counter Support  - 1 x daily - 7 x weekly - 3 sets - 10 reps - Standing 4-Way Leg Reach with Counter Support  - 1 x daily - 7 x weekly - 3 sets - 10 reps  GOALS: Goals reviewed with patient? Yes  SHORT TERM GOALS: Target date: 01/26/2024  Patient will be independent in home exercise program to improve strength/mobility for better functional  independence with ADLs.  Baseline: Goal status: INITIAL  LONG TERM GOALS: Target date: 03/22/2024  Patient (< 62 years old) will complete five times sit to stand test in < 10 seconds indicating an increased LE strength and improved balance.  Baseline: 30.53 with 4 posterior LOB 03/29/24: 22.61 with 3 posterior LOB 06/21/24: 21.7 with 2 posterior LOB Goal status: PROGRESSING  2.   Patient will increase Berg Balance score by >45/56 to demonstrate decreased fall risk during functional activities.  Baseline: 5/5: 26  03/29/24: 32 Goal status: INITIAL  3.    Patient will increase six minute walk test distance to >1200 for progression to age norm community ambulator and improve gait ability  Baseline: perform next session 5/12: 710 ft  03/29/24: 770' 06/18/24: 792' Goal status: PROGRESSING  4.  Patient will score > 60/80 on LEFS for improved functional mobility and quality of life.  Baseline: perform next session  5/12: 43 03/29/24: 39/80 06/21/24: 38/80 Goal status: INITIAL    ASSESSMENT:  CLINICAL IMPRESSION:   Pt responded well to the exercises today and was able to keep his balance better in the shoes he was wearing.  Pt had very few instances of instability with the resisted ambulation attempts.  Pt ultimately still unsteady when first standing, but is able to correct when moving slower.   Pt will continue to benefit from skilled therapy to address remaining deficits in order to improve overall QoL and return to PLOF.        OBJECTIVE IMPAIRMENTS: Abnormal gait, cardiopulmonary status limiting activity, decreased activity tolerance, decreased balance, decreased coordination, decreased endurance, decreased knowledge of use of DME, decreased mobility, difficulty walking, decreased ROM, decreased strength, dizziness, impaired perceived functional ability, impaired flexibility, impaired vision/preception, improper body mechanics, postural dysfunction, and obesity.   ACTIVITY LIMITATIONS: carrying, lifting, bending, sitting, standing, squatting, stairs, transfers, bed mobility, toileting, dressing, reach over head, hygiene/grooming, locomotion level, and caring for others  PARTICIPATION LIMITATIONS: meal prep, cleaning, laundry, driving, shopping, community activity, occupation, and yard work  PERSONAL FACTORS: Age, Behavior pattern, Fitness, Past/current experiences, Time since onset of injury/illness/exacerbation, Transportation, and 3+ comorbidities: DM, HTN, Anemia, Bells palsy, Hypothyroidism, Kidney stones, stroke and pseudotumour cerebri. Macular degeneration with impaired visual acuity  are also affecting patient's functional outcome.   REHAB POTENTIAL: Good  CLINICAL DECISION MAKING: Evolving/moderate complexity  EVALUATION COMPLEXITY: Moderate  PLAN:  PT FREQUENCY: 2x/week  PT DURATION: 12 weeks  PLANNED INTERVENTIONS: 97164- PT Re-evaluation, 97750- Physical Performance Testing,  97110-Therapeutic exercises, 97530- Therapeutic activity, [id number]- Neuromuscular re-education, 97535- Self Care, 02859- Manual therapy, [id number]- Gait training, [phone]- Orthotic Initial, [phone]- Orthotic/Prosthetic subsequent, [phone]- Canalith repositioning, [id number]- Splinting, H9716- Electrical stimulation (unattended), [phone]- Electrical stimulation (manual), [id number]- Vasopneumatic device, [id number]- Ultrasound, [id number]- Traction (mechanical), [id number]- Ionotophoresis 4mg /ml Dexamethasone , Patient/Family education, Balance training, Stair training, Taping, Dry Needling, Joint mobilization, Spinal mobilization, Scar mobilization, Compression bandaging, Vestibular training, Visual/preceptual remediation/compensation, Cognitive remediation, DME instructions, Cryotherapy, Moist heat, and Biofeedback  PLAN FOR NEXT SESSION:  Blaze Pods, Korebalance balance, strength, L ankle strength, stairs, LE endurance   Fonda Simpers, PT, DPT Physical Therapist - [place]  [facility]  07/07/24, 1:43 PM

## 2024-07-09 ENCOUNTER — Telehealth: Payer: Self-pay

## 2024-07-09 NOTE — Patient Instructions (Signed)
 Gregory Crane - I am sorry I was unable to reach you today for our scheduled appointment. I work with Glendia Shad, MD and am calling to support your healthcare needs. Please contact me at [phone] at your earliest convenience. I look forward to speaking with you soon.   Thank you,  Thersia Hoar, BSW, MHA [place]  [facility] Social Worker, Population Health [phone]

## 2024-07-12 ENCOUNTER — Ambulatory Visit

## 2024-07-12 DIAGNOSIS — R269 Unspecified abnormalities of gait and mobility: Secondary | ICD-10-CM

## 2024-07-12 DIAGNOSIS — M6281 Muscle weakness (generalized): Secondary | ICD-10-CM | POA: Diagnosis not present

## 2024-07-12 DIAGNOSIS — R2689 Other abnormalities of gait and mobility: Secondary | ICD-10-CM

## 2024-07-12 DIAGNOSIS — R278 Other lack of coordination: Secondary | ICD-10-CM

## 2024-07-12 DIAGNOSIS — R2681 Unsteadiness on feet: Secondary | ICD-10-CM

## 2024-07-12 DIAGNOSIS — R262 Difficulty in walking, not elsewhere classified: Secondary | ICD-10-CM

## 2024-07-12 NOTE — Therapy (Signed)
 OUTPATIENT PHYSICAL THERAPY NEURO TREATMENT   Patient Name: Gregory Crane MRN: [id number] DOB:[birth year], 53 y.o., male Today's Date: 07/12/2024  PCP: Glendia Shad  REFERRING PROVIDER: Glendia Shad  END OF SESSION:  PT End of Session - 07/12/24 1159     Visit Number 35    Number of Visits 48    Date for Recertification  08/16/24    Progress Note Due on Visit 20    PT Start Time 1151    PT Stop Time 1230    PT Time Calculation (min) 39 min    Equipment Utilized During Treatment Gait belt    Activity Tolerance Patient tolerated treatment well    Behavior During Therapy [facility] for tasks assessed/performed         Past Medical History:  Diagnosis Date   Allergy    Anemia    Bell's palsy    Diabetes mellitus without complication (HCC)    diet controlled   Fall 07/2023   Hypertension    Hypothyroidism    Kidney stones    Pseudotumor cerebri    Stroke [facility])    Past Surgical History:  Procedure Laterality Date   COLONOSCOPY WITH PROPOFOL  N/A 03/30/2020   Procedure: COLONOSCOPY WITH PROPOFOL ;  Surgeon: Maryruth Ole DASEN, MD;  Location: [facility] ENDOSCOPY;  Service: Endoscopy;  Laterality: N/A;   CORONARY STENT INTERVENTION N/A 03/17/2024   Procedure: CORONARY STENT INTERVENTION;  Surgeon: Jordan, Peter M, MD;  Location: [facility] INVASIVE CV LAB;  Service: Cardiovascular;  Laterality: N/A;   LEFT HEART CATH AND CORONARY ANGIOGRAPHY N/A 03/17/2024   Procedure: LEFT HEART CATH AND CORONARY ANGIOGRAPHY;  Surgeon: Jordan, Peter M, MD;  Location: [facility] INVASIVE CV LAB;  Service: Cardiovascular;  Laterality: N/A;   LOOP RECORDER INSERTION N/A 01/27/2018   Procedure: LOOP RECORDER INSERTION;  Surgeon: Fernande Elspeth BROCKS, MD;  Location: [facility] INVASIVE CV LAB;  Service: Cardiovascular;  Laterality: N/A;   LUMBAR PUNCTURE     as child   NO PAST SURGERIES     REMOVAL OF A DIALYSIS CATHETER     TEE WITHOUT CARDIOVERSION N/A 01/07/2018   Procedure: TRANSESOPHAGEAL ECHOCARDIOGRAM (TEE);   Surgeon: Perla Evalene PARAS, MD;  Location: [facility] ORS;  Service: Cardiovascular;  Laterality: N/A;   Patient Active Problem List   Diagnosis Date Noted   Bradycardia 04/30/2024   CAD S/P percutaneous coronary angioplasty 03/17/2024   Non-ST elevation (NSTEMI) myocardial infarction (HCC) 03/16/2024   Hypervolemia 03/14/2024   Hypoxic respiratory failure (HCC) 03/14/2024   Left hip pain 01/30/2024   SVT (supraventricular tachycardia) 08/10/2023   Atrial fibrillation (HCC) 07/03/2023   Atrial fibrillation with RVR (HCC) 07/02/2023   Fall 06/15/2023   Non-compliance with renal dialysis 05/02/2023   Volume overload 04/29/2023   Renal osteodystrophy 11/25/2022   Unsteady gait 10/05/2022   Steal syndrome of dialysis vascular access 05/28/2022   Hydronephrosis, left 05/04/2022   Leukocytosis 05/04/2022   Diabetic retinopathy associated with diabetes mellitus due to underlying condition (HCC) 01/13/2022   ESRD on hemodialysis (HCC) 01/13/2022   Deafness in right ear 08/11/2021   History of colon polyps 10/15/2020   Type 2 diabetes mellitus, with long-term current use of insulin  (HCC) 09/09/2020   Cryptogenic stroke (HCC) 07/13/2020   History of loop recorder 07/13/2020   Lymphedema 06/08/2019   Anemia 04/17/2019   Swelling of both lower extremities 01/10/2019   Facial droop 04/09/2018   Daytime somnolence 03/30/2018   Carotid artery disease 02/03/2018   Intracranial vascular stenosis 09/12/2017   History of  CVA (cerebrovascular accident) 11/10/2016   Benign localized hyperplasia of prostate with urinary obstruction 10/27/2016   History of nephrolithiasis 10/27/2016   TIA (transient ischemic attack) 10/20/2016   Near syncope 06/23/2016   Organic impotence 10/01/2015   Neuropathy 08/06/2015   Health care maintenance 08/06/2015   Hypertension 08/06/2015   Hypothyroidism 10/10/2013   Microalbuminuria 10/10/2013   Hyperlipidemia 10/10/2013   B12 deficiency 10/10/2013   Environmental  allergies 07/04/2013    ONSET DATE: 3 years ago  REFERRING DIAG: unsteady gait  THERAPY DIAG:  Muscle weakness (generalized)  Unsteadiness on feet  Other abnormalities of gait and mobility  Difficulty in walking, not elsewhere classified  Abnormality of gait and mobility  Other lack of coordination  Rationale for Evaluation and Treatment: Rehabilitation  SUBJECTIVE:                                                                                                                                                                                             SUBJECTIVE STATEMENT:   Pt reports he had a good weekend, and was able to get [place] on Saturday.     Pt accompanied by: self  PERTINENT HISTORY: Patient returning to PT s/p hospitalization for pneumonia, , SVT, ESRD on hypodialysis. PMH DM, HTN, Anemia, Bells palsy, Hypothyroidism, Kidney stones, stroke and pseudotumour cerebri. Macular degeneration with impaired visual acuity   PAIN:  Are you having pain? Some L foot numbness/neuropathy  PRECAUTIONS: Fall  RED FLAGS: None   WEIGHT BEARING RESTRICTIONS: No  FALLS: Has patient fallen in last 6 months? No  LIVING ENVIRONMENT: Lives with: lives alone Lives in: House/apartment Stairs: yes stairs in house Has following equipment at home: Single point cane  PLOF: Independent with basic ADLs  PATIENT GOALS: balance, strength  OBJECTIVE:  Note: Objective measures were completed at Evaluation unless otherwise noted.  DIAGNOSTIC FINDINGS: IMPRESSION: Interval resolution of previously noted trace left parafalcine subdural hematoma. No acute intracranial process.  COGNITION: Overall cognitive status: Within functional limits for tasks assessed   SENSATION: WFL  COORDINATION: Heel slide: WFL RLE; slight decrease with LLE   MUSCLE TONE: WFL  POSTURE: rounded shoulders, forward head, and posterior pelvic tilt  LOWER EXTREMITY MMT:    MMT Right Eval  Left Eval  Hip flexion 4 4  Hip extension    Hip abduction 3 3  Hip adduction 3+ 3+  Hip internal rotation    Hip external rotation    Knee flexion 4- 4-  Knee extension 4 4  Ankle dorsiflexion 3 2+  Ankle plantarflexion 3+ 3  Ankle inversion    Ankle eversion    (Blank  rows = not tested)  BED MOBILITY:  Not tested  TRANSFERS: Sit to stand: CGA  Assistive device utilized: None     Stand to sit: CGA  Assistive device utilized: None     Chair to chair: CGA  Assistive device utilized: None       RAMP:  Not tested  CURB:  Findings: needs UE support to step up  STAIRS: heavy BUE support step over step negotiation.  GAIT: Findings: Gait Characteristics: decreased stride length, Left foot flat, wide BOS, and poor foot clearance- Left, Distance walked: 60 ft, Assistive device utilized:None, Level of assistance: CGA, and Comments: increased L ankle rolling due to not wearing AFO  FUNCTIONAL TESTS:  5 times sit to stand: 30.53 with 4 posterior LOB 6 minute walk test: perform next session  Berg Balance Scale: 26  PATIENT SURVEYS:  LEFS give next session                                                                                                                               TREATMENT DATE: 07/12/24    TherAct: To improve functional movements patterns for everyday tasks  The patient completed 10 minutes at level(s) 5-10 on the NuStep using both BUE/BLE reciprocal movements to promote strength, endurance, and cardiorespiratory fitness. PT increased the resistance level and monitored the patient's response to the intervention throughout. The patient required min cueing for technique and supervisions level of assistance.   STS from chair with ball toss and catch once in standing, green balloon ball, x10  STS from chair with ball toss and catch once in standing, yellow weighted ball (2kg), x10  Stair training, 8 total steps with pt performing well with task, utilizing UE's  for support, no instability        PATIENT EDUCATION: Education details: goals, POC, HEP Person educated: Patient Education method: Explanation, Demonstration, Tactile cues, Verbal cues, and Handouts Education comprehension: verbalized understanding, returned demonstration, verbal cues required, tactile cues required, and needs further education  HOME EXERCISE PROGRAM: Access Code: [email or web address] URL: [email or web address] Date: 05/26/2024 Prepared by: Sidra Simpers  Exercises - Leg Extension  - 1 x daily - 7 x weekly - 2 sets - 10 reps - 5 hold - Seated March  - 1 x daily - 7 x weekly - 2 sets - 10 reps - 5 hold - Seated Heel Raise  - 1 x daily - 7 x weekly - 2 sets - 10 reps - 5 hold - Sit to Stand  - 1 x daily - 7 x weekly - 3 sets - 10 reps - Mini Squat with Counter Support  - 1 x daily - 7 x weekly - 3 sets - 10 reps - Standing 4-Way Leg Reach with Counter Support  - 1 x daily - 7 x weekly - 3 sets - 10 reps  GOALS: Goals reviewed with patient? Yes  SHORT TERM GOALS: Target date: 01/26/2024  Patient will be independent  in home exercise program to improve strength/mobility for better functional independence with ADLs.  Baseline: Goal status: INITIAL  LONG TERM GOALS: Target date: 03/22/2024  Patient (< 38 years old) will complete five times sit to stand test in < 10 seconds indicating an increased LE strength and improved balance.  Baseline: 30.53 with 4 posterior LOB 03/29/24: 22.61 with 3 posterior LOB 06/21/24: 21.7 with 2 posterior LOB Goal status: PROGRESSING  2.   Patient will increase Berg Balance score by >45/56 to demonstrate decreased fall risk during functional activities.  Baseline: 5/5: 26  03/29/24: 32 Goal status: INITIAL  3.    Patient will increase six minute walk test distance to >1200 for progression to age norm community ambulator and improve gait ability  Baseline: perform next session 5/12: 710 ft  03/29/24: 770' 06/18/24: 792' Goal status:  PROGRESSING  4.  Patient will score > 60/80 on LEFS for improved functional mobility and quality of life.  Baseline: perform next session 5/12: 43 03/29/24: 39/80 06/21/24: 38/80 Goal status: INITIAL    ASSESSMENT:  CLINICAL IMPRESSION:   Pt continues to progress and make good progress towards goals.  Pt had very mild instability during the session, only with a few instances when standing from seated position.  Pt would sit back down on his own accord when getting unstable, however pt is demonstrating improved strength and stability during the session today.   Pt will continue to benefit from skilled therapy to address remaining deficits in order to improve overall QoL and return to PLOF.       OBJECTIVE IMPAIRMENTS: Abnormal gait, cardiopulmonary status limiting activity, decreased activity tolerance, decreased balance, decreased coordination, decreased endurance, decreased knowledge of use of DME, decreased mobility, difficulty walking, decreased ROM, decreased strength, dizziness, impaired perceived functional ability, impaired flexibility, impaired vision/preception, improper body mechanics, postural dysfunction, and obesity.   ACTIVITY LIMITATIONS: carrying, lifting, bending, sitting, standing, squatting, stairs, transfers, bed mobility, toileting, dressing, reach over head, hygiene/grooming, locomotion level, and caring for others  PARTICIPATION LIMITATIONS: meal prep, cleaning, laundry, driving, shopping, community activity, occupation, and yard work  PERSONAL FACTORS: Age, Behavior pattern, Fitness, Past/current experiences, Time since onset of injury/illness/exacerbation, Transportation, and 3+ comorbidities: DM, HTN, Anemia, Bells palsy, Hypothyroidism, Kidney stones, stroke and pseudotumour cerebri. Macular degeneration with impaired visual acuity  are also affecting patient's functional outcome.   REHAB POTENTIAL: Good  CLINICAL DECISION MAKING: Evolving/moderate  complexity  EVALUATION COMPLEXITY: Moderate  PLAN:  PT FREQUENCY: 2x/week  PT DURATION: 12 weeks  PLANNED INTERVENTIONS: 97164- PT Re-evaluation, 97750- Physical Performance Testing, 97110-Therapeutic exercises, 97530- Therapeutic activity, [id number]- Neuromuscular re-education, 97535- Self Care, 02859- Manual therapy, [id number]- Gait training, [id number]- Orthotic Initial, [id number]- Orthotic/Prosthetic subsequent, [phone]- Canalith repositioning, [id number]- Splinting, H9716- Electrical stimulation (unattended), [phone]- Electrical stimulation (manual), [id number]- Vasopneumatic device, [id number]- Ultrasound, [id number]- Traction (mechanical), [id number]- Ionotophoresis 4mg /ml Dexamethasone , Patient/Family education, Balance training, Stair training, Taping, Dry Needling, Joint mobilization, Spinal mobilization, Scar mobilization, Compression bandaging, Vestibular training, Visual/preceptual remediation/compensation, Cognitive remediation, DME instructions, Cryotherapy, Moist heat, and Biofeedback  PLAN FOR NEXT SESSION:   Blaze Pods, Korebalance balance, strength, L ankle strength, stairs, LE endurance   Fonda Simpers, PT, DPT Physical Therapist - [place]  [facility]  07/12/24, 12:00 PM

## 2024-07-13 ENCOUNTER — Telehealth: Payer: Self-pay

## 2024-07-13 NOTE — Progress Notes (Unsigned)
 Complex Care Management Note Care Guide Note  07/13/2024 Name: Ramiz Turpin MRN: [id number] DOB: [birth year]   Complex Care Management Outreach Attempts: An unsuccessful telephone outreach was attempted today to offer the patient information about available complex care management services.  Follow Up Plan:  Additional outreach attempts will be made to offer the patient complex care management information and services.   Encounter Outcome:  No Answer  Dreama [facility]  [facility], [facility] VBCI Assistant Direct Dial: [phone]  Fax: [phone]

## 2024-07-14 ENCOUNTER — Ambulatory Visit

## 2024-07-14 NOTE — Therapy (Incomplete)
 OUTPATIENT PHYSICAL THERAPY NEURO TREATMENT   Patient Name: Gregory Crane MRN: [id number] DOB:07-07-1971, 53 y.o., male Today's Date: 07/14/2024  PCP: Glendia Shad  REFERRING PROVIDER: Glendia Shad  END OF SESSION:   Past Medical History:  Diagnosis Date   Allergy    Anemia    Bell's palsy    Diabetes mellitus without complication (HCC)    diet controlled   Fall 07/2023   Hypertension    Hypothyroidism    Kidney stones    Pseudotumor cerebri    Stroke [facility])    Past Surgical History:  Procedure Laterality Date   COLONOSCOPY WITH PROPOFOL  N/A 03/30/2020   Procedure: COLONOSCOPY WITH PROPOFOL ;  Surgeon: Maryruth Ole DASEN, MD;  Location: [facility] ENDOSCOPY;  Service: Endoscopy;  Laterality: N/A;   CORONARY STENT INTERVENTION N/A 03/17/2024   Procedure: CORONARY STENT INTERVENTION;  Surgeon: Jordan, Peter M, MD;  Location: [facility] INVASIVE CV LAB;  Service: Cardiovascular;  Laterality: N/A;   LEFT HEART CATH AND CORONARY ANGIOGRAPHY N/A 03/17/2024   Procedure: LEFT HEART CATH AND CORONARY ANGIOGRAPHY;  Surgeon: Jordan, Peter M, MD;  Location: [facility] INVASIVE CV LAB;  Service: Cardiovascular;  Laterality: N/A;   LOOP RECORDER INSERTION N/A 01/27/2018   Procedure: LOOP RECORDER INSERTION;  Surgeon: Fernande Elspeth BROCKS, MD;  Location: [facility] INVASIVE CV LAB;  Service: Cardiovascular;  Laterality: N/A;   LUMBAR PUNCTURE     as child   NO PAST SURGERIES     REMOVAL OF A DIALYSIS CATHETER     TEE WITHOUT CARDIOVERSION N/A 01/07/2018   Procedure: TRANSESOPHAGEAL ECHOCARDIOGRAM (TEE);  Surgeon: Perla Evalene PARAS, MD;  Location: [facility] ORS;  Service: Cardiovascular;  Laterality: N/A;   Patient Active Problem List   Diagnosis Date Noted   Bradycardia 04/30/2024   CAD S/P percutaneous coronary angioplasty 03/17/2024   Non-ST elevation (NSTEMI) myocardial infarction (HCC) 03/16/2024   Hypervolemia 03/14/2024   Hypoxic respiratory failure (HCC) 03/14/2024   Left hip pain 01/30/2024   SVT  (supraventricular tachycardia) 08/10/2023   Atrial fibrillation (HCC) 07/03/2023   Atrial fibrillation with RVR (HCC) 07/02/2023   Fall 06/15/2023   Non-compliance with renal dialysis 05/02/2023   Volume overload 04/29/2023   Renal osteodystrophy 11/25/2022   Unsteady gait 10/05/2022   Steal syndrome of dialysis vascular access 05/28/2022   Hydronephrosis, left 05/04/2022   Leukocytosis 05/04/2022   Diabetic retinopathy associated with diabetes mellitus due to underlying condition (HCC) 01/13/2022   ESRD on hemodialysis (HCC) 01/13/2022   Deafness in right ear 08/11/2021   History of colon polyps 10/15/2020   Type 2 diabetes mellitus, with long-term current use of insulin  (HCC) 09/09/2020   Cryptogenic stroke (HCC) 07/13/2020   History of loop recorder 07/13/2020   Lymphedema 06/08/2019   Anemia 04/17/2019   Swelling of both lower extremities 01/10/2019   Facial droop 04/09/2018   Daytime somnolence 03/30/2018   Carotid artery disease 02/03/2018   Intracranial vascular stenosis 09/12/2017   History of CVA (cerebrovascular accident) 11/10/2016   Benign localized hyperplasia of prostate with urinary obstruction 10/27/2016   History of nephrolithiasis 10/27/2016   TIA (transient ischemic attack) 10/20/2016   Near syncope 06/23/2016   Organic impotence 10/01/2015   Neuropathy 08/06/2015   Health care maintenance 08/06/2015   Hypertension 08/06/2015   Hypothyroidism 10/10/2013   Microalbuminuria 10/10/2013   Hyperlipidemia 10/10/2013   B12 deficiency 10/10/2013   Environmental allergies 07/04/2013    ONSET DATE: 3 years ago  REFERRING DIAG: unsteady gait  THERAPY DIAG:  No diagnosis found.  Rationale  for Evaluation and Treatment: Rehabilitation  SUBJECTIVE:                                                                                                                                                                                             SUBJECTIVE STATEMENT:    ***   Pt accompanied by: self  PERTINENT HISTORY: Patient returning to PT s/p hospitalization for pneumonia, , SVT, ESRD on hypodialysis. PMH DM, HTN, Anemia, Bells palsy, Hypothyroidism, Kidney stones, stroke and pseudotumour cerebri. Macular degeneration with impaired visual acuity   PAIN:  Are you having pain? Some L foot numbness/neuropathy  PRECAUTIONS: Fall  RED FLAGS: None   WEIGHT BEARING RESTRICTIONS: No  FALLS: Has patient fallen in last 6 months? No  LIVING ENVIRONMENT: Lives with: lives alone Lives in: House/apartment Stairs: yes stairs in house Has following equipment at home: Single point cane  PLOF: Independent with basic ADLs  PATIENT GOALS: balance, strength  OBJECTIVE:  Note: Objective measures were completed at Evaluation unless otherwise noted.  DIAGNOSTIC FINDINGS: IMPRESSION: Interval resolution of previously noted trace left parafalcine subdural hematoma. No acute intracranial process.  COGNITION: Overall cognitive status: Within functional limits for tasks assessed   SENSATION: WFL  COORDINATION: Heel slide: WFL RLE; slight decrease with LLE   MUSCLE TONE: WFL  POSTURE: rounded shoulders, forward head, and posterior pelvic tilt  LOWER EXTREMITY MMT:    MMT Right Eval Left Eval  Hip flexion 4 4  Hip extension    Hip abduction 3 3  Hip adduction 3+ 3+  Hip internal rotation    Hip external rotation    Knee flexion 4- 4-  Knee extension 4 4  Ankle dorsiflexion 3 2+  Ankle plantarflexion 3+ 3  Ankle inversion    Ankle eversion    (Blank rows = not tested)  BED MOBILITY:  Not tested  TRANSFERS: Sit to stand: CGA  Assistive device utilized: None     Stand to sit: CGA  Assistive device utilized: None     Chair to chair: CGA  Assistive device utilized: None       RAMP:  Not tested  CURB:  Findings: needs UE support to step up  STAIRS: heavy BUE support step over step negotiation.  GAIT: Findings: Gait  Characteristics: decreased stride length, Left foot flat, wide BOS, and poor foot clearance- Left, Distance walked: 60 ft, Assistive device utilized:None, Level of assistance: CGA, and Comments: increased L ankle rolling due to not wearing AFO  FUNCTIONAL TESTS:  5 times sit to stand: 30.53 with 4 posterior LOB 6 minute walk test: perform next session  Berg Balance Scale:  26  PATIENT SURVEYS:  LEFS give next session                                                                                                                               TREATMENT DATE: 07/14/24  ***  TherAct: To improve functional movements patterns for everyday tasks  The patient completed 10 minutes at level(s) 5-10 on the NuStep using both BUE/BLE reciprocal movements to promote strength, endurance, and cardiorespiratory fitness. PT increased the resistance level and monitored the patient's response to the intervention throughout. The patient required min cueing for technique and supervisions level of assistance.   STS from chair with ball toss and catch once in standing, green balloon ball, x10  STS from chair with ball toss and catch once in standing, yellow weighted ball (2kg), x10  Stair training, 8 total steps with pt performing well with task, utilizing UE's for support, no instability        PATIENT EDUCATION: Education details: goals, POC, HEP Person educated: Patient Education method: Explanation, Demonstration, Tactile cues, Verbal cues, and Handouts Education comprehension: verbalized understanding, returned demonstration, verbal cues required, tactile cues required, and needs further education  HOME EXERCISE PROGRAM: Access Code: [email or web address] URL: [email or web address] Date: 05/26/2024 Prepared by: Sidra Simpers  Exercises - Leg Extension  - 1 x daily - 7 x weekly - 2 sets - 10 reps - 5 hold - Seated March  - 1 x daily - 7 x weekly - 2 sets - 10 reps - 5 hold - Seated Heel Raise   - 1 x daily - 7 x weekly - 2 sets - 10 reps - 5 hold - Sit to Stand  - 1 x daily - 7 x weekly - 3 sets - 10 reps - Mini Squat with Counter Support  - 1 x daily - 7 x weekly - 3 sets - 10 reps - Standing 4-Way Leg Reach with Counter Support  - 1 x daily - 7 x weekly - 3 sets - 10 reps  GOALS: Goals reviewed with patient? Yes  SHORT TERM GOALS: Target date: 01/26/2024  Patient will be independent in home exercise program to improve strength/mobility for better functional independence with ADLs.  Baseline: Goal status: INITIAL  LONG TERM GOALS: Target date: 03/22/2024  Patient (< 70 years old) will complete five times sit to stand test in < 10 seconds indicating an increased LE strength and improved balance.  Baseline: 30.53 with 4 posterior LOB 03/29/24: 22.61 with 3 posterior LOB 06/21/24: 21.7 with 2 posterior LOB Goal status: PROGRESSING  2.   Patient will increase Berg Balance score by >45/56 to demonstrate decreased fall risk during functional activities.  Baseline: 5/5: 26  03/29/24: 32 Goal status: INITIAL  3.    Patient will increase six minute walk test distance to >1200 for progression to age norm community ambulator and improve gait ability  Baseline: perform next session 5/12: 710 ft  03/29/24: 770' 06/18/24:  792' Goal status: PROGRESSING  4.  Patient will score > 60/80 on LEFS for improved functional mobility and quality of life.  Baseline: perform next session 5/12: 43 03/29/24: 39/80 06/21/24: 38/80 Goal status: INITIAL    ASSESSMENT:  CLINICAL IMPRESSION:   ***     OBJECTIVE IMPAIRMENTS: Abnormal gait, cardiopulmonary status limiting activity, decreased activity tolerance, decreased balance, decreased coordination, decreased endurance, decreased knowledge of use of DME, decreased mobility, difficulty walking, decreased ROM, decreased strength, dizziness, impaired perceived functional ability, impaired flexibility, impaired vision/preception, improper body  mechanics, postural dysfunction, and obesity.   ACTIVITY LIMITATIONS: carrying, lifting, bending, sitting, standing, squatting, stairs, transfers, bed mobility, toileting, dressing, reach over head, hygiene/grooming, locomotion level, and caring for others  PARTICIPATION LIMITATIONS: meal prep, cleaning, laundry, driving, shopping, community activity, occupation, and yard work  PERSONAL FACTORS: Age, Behavior pattern, Fitness, Past/current experiences, Time since onset of injury/illness/exacerbation, Transportation, and 3+ comorbidities: DM, HTN, Anemia, Bells palsy, Hypothyroidism, Kidney stones, stroke and pseudotumour cerebri. Macular degeneration with impaired visual acuity  are also affecting patient's functional outcome.   REHAB POTENTIAL: Good  CLINICAL DECISION MAKING: Evolving/moderate complexity  EVALUATION COMPLEXITY: Moderate  PLAN:  PT FREQUENCY: 2x/week  PT DURATION: 12 weeks  PLANNED INTERVENTIONS: 97164- PT Re-evaluation, 97750- Physical Performance Testing, 97110-Therapeutic exercises, 97530- Therapeutic activity, [id number]- Neuromuscular re-education, 97535- Self Care, 02859- Manual therapy, [id number]- Gait training, [id number]- Orthotic Initial, [id number]- Orthotic/Prosthetic subsequent, [phone]- Canalith repositioning, [id number]- Splinting, H9716- Electrical stimulation (unattended), [phone]- Electrical stimulation (manual), [id number]- Vasopneumatic device, [id number]- Ultrasound, [id number]- Traction (mechanical), [id number]- Ionotophoresis 4mg /ml Dexamethasone , Patient/Family education, Balance training, Stair training, Taping, Dry Needling, Joint mobilization, Spinal mobilization, Scar mobilization, Compression bandaging, Vestibular training, Visual/preceptual remediation/compensation, Cognitive remediation, DME instructions, Cryotherapy, Moist heat, and Biofeedback  PLAN FOR NEXT SESSION:  Blaze Pods, Korebalance balance, strength, L ankle strength, stairs, LE endurance   Fonda Simpers, PT, DPT Physical  Therapist - [place]  [facility]  07/14/24, 8:04 AM

## 2024-07-15 NOTE — Progress Notes (Signed)
 Complex Care Management Note Care Guide Note  07/15/2024 Name: Gregory Crane MRN: [id number] DOB: [birth year]   Complex Care Management Outreach Attempts: A second unsuccessful outreach was attempted today to offer the patient with information about available complex care management services.  Follow Up Plan:  No further outreach attempts will be made at this time. We have been unable to contact the patient to offer or enroll patient in complex care management services.  Encounter Outcome:  No Answer  Dreama Lynwood [facility]  [facility], [facility] VBCI Assistant Direct Dial: [phone]  Fax: [phone]

## 2024-07-16 NOTE — Progress Notes (Signed)
 Complex Care Management Care Guide Note  07/16/2024 Name: Gregory Crane MRN: [id number] DOB: Jan 17, 1971  Gregory Crane is a 53 y.o. year old male who is a primary care patient of Glendia Shad, MD and is actively engaged with the care management team. I reached out to Gregory Crane by phone today to assist with re-scheduling  with the BSW.   Follow up plan: Telephone appointment with complex care management team member scheduled for:  07/30/24 at 12:00 p.m.   Gregory Crane [facility]  [facility], [facility] VBCI Assistant Direct Dial: [phone]  Fax: [phone]

## 2024-07-19 ENCOUNTER — Ambulatory Visit: Admitting: Physical Therapy

## 2024-07-21 ENCOUNTER — Ambulatory Visit: Admitting: Physical Therapy

## 2024-07-21 DIAGNOSIS — R262 Difficulty in walking, not elsewhere classified: Secondary | ICD-10-CM

## 2024-07-21 DIAGNOSIS — R269 Unspecified abnormalities of gait and mobility: Secondary | ICD-10-CM

## 2024-07-21 DIAGNOSIS — R2689 Other abnormalities of gait and mobility: Secondary | ICD-10-CM

## 2024-07-21 DIAGNOSIS — R2681 Unsteadiness on feet: Secondary | ICD-10-CM

## 2024-07-21 DIAGNOSIS — M6281 Muscle weakness (generalized): Secondary | ICD-10-CM

## 2024-07-21 NOTE — Therapy (Signed)
 OUTPATIENT PHYSICAL THERAPY NEURO TREATMENT   Patient Name: Gregory Crane MRN: [id number] DOB:09-06-1970, 53 y.o., male Today's Date: 07/21/2024  PCP: Glendia Shad  REFERRING PROVIDER: Glendia Shad  END OF SESSION:  PT End of Session - 07/21/24 1139     Visit Number 36    Number of Visits 48    Date for Recertification  08/16/24    Progress Note Due on Visit 20    PT Start Time 1136    PT Stop Time 1216    PT Time Calculation (min) 40 min    Equipment Utilized During Treatment Gait belt    Activity Tolerance Patient tolerated treatment well    Behavior During Therapy [facility] for tasks assessed/performed          Past Medical History:  Diagnosis Date   Allergy    Anemia    Bell's palsy    Diabetes mellitus without complication (HCC)    diet controlled   Fall 07/2023   Hypertension    Hypothyroidism    Kidney stones    Pseudotumor cerebri    Stroke [facility])    Past Surgical History:  Procedure Laterality Date   COLONOSCOPY WITH PROPOFOL  N/A 03/30/2020   Procedure: COLONOSCOPY WITH PROPOFOL ;  Surgeon: Maryruth Ole DASEN, MD;  Location: [facility] ENDOSCOPY;  Service: Endoscopy;  Laterality: N/A;   CORONARY STENT INTERVENTION N/A 03/17/2024   Procedure: CORONARY STENT INTERVENTION;  Surgeon: Jordan, Peter M, MD;  Location: [facility] INVASIVE CV LAB;  Service: Cardiovascular;  Laterality: N/A;   LEFT HEART CATH AND CORONARY ANGIOGRAPHY N/A 03/17/2024   Procedure: LEFT HEART CATH AND CORONARY ANGIOGRAPHY;  Surgeon: Jordan, Peter M, MD;  Location: [facility] INVASIVE CV LAB;  Service: Cardiovascular;  Laterality: N/A;   LOOP RECORDER INSERTION N/A 01/27/2018   Procedure: LOOP RECORDER INSERTION;  Surgeon: Fernande Elspeth BROCKS, MD;  Location: [facility] INVASIVE CV LAB;  Service: Cardiovascular;  Laterality: N/A;   LUMBAR PUNCTURE     as child   NO PAST SURGERIES     REMOVAL OF A DIALYSIS CATHETER     TEE WITHOUT CARDIOVERSION N/A 01/07/2018   Procedure: TRANSESOPHAGEAL ECHOCARDIOGRAM (TEE);   Surgeon: Perla Evalene PARAS, MD;  Location: [facility] ORS;  Service: Cardiovascular;  Laterality: N/A;   Patient Active Problem List   Diagnosis Date Noted   Bradycardia 04/30/2024   CAD S/P percutaneous coronary angioplasty 03/17/2024   Non-ST elevation (NSTEMI) myocardial infarction (HCC) 03/16/2024   Hypervolemia 03/14/2024   Hypoxic respiratory failure (HCC) 03/14/2024   Left hip pain 01/30/2024   SVT (supraventricular tachycardia) 08/10/2023   Atrial fibrillation (HCC) 07/03/2023   Atrial fibrillation with RVR (HCC) 07/02/2023   Fall 06/15/2023   Non-compliance with renal dialysis 05/02/2023   Volume overload 04/29/2023   Renal osteodystrophy 11/25/2022   Unsteady gait 10/05/2022   Steal syndrome of dialysis vascular access 05/28/2022   Hydronephrosis, left 05/04/2022   Leukocytosis 05/04/2022   Diabetic retinopathy associated with diabetes mellitus due to underlying condition (HCC) 01/13/2022   ESRD on hemodialysis (HCC) 01/13/2022   Deafness in right ear 08/11/2021   History of colon polyps 10/15/2020   Type 2 diabetes mellitus, with long-term current use of insulin  (HCC) 09/09/2020   Cryptogenic stroke (HCC) 07/13/2020   History of loop recorder 07/13/2020   Lymphedema 06/08/2019   Anemia 04/17/2019   Swelling of both lower extremities 01/10/2019   Facial droop 04/09/2018   Daytime somnolence 03/30/2018   Carotid artery disease 02/03/2018   Intracranial vascular stenosis 09/12/2017   History  of CVA (cerebrovascular accident) 11/10/2016   Benign localized hyperplasia of prostate with urinary obstruction 10/27/2016   History of nephrolithiasis 10/27/2016   TIA (transient ischemic attack) 10/20/2016   Near syncope 06/23/2016   Organic impotence 10/01/2015   Neuropathy 08/06/2015   Health care maintenance 08/06/2015   Hypertension 08/06/2015   Hypothyroidism 10/10/2013   Microalbuminuria 10/10/2013   Hyperlipidemia 10/10/2013   B12 deficiency 10/10/2013   Environmental  allergies 07/04/2013    ONSET DATE: 3 years ago  REFERRING DIAG: unsteady gait  THERAPY DIAG:  Muscle weakness (generalized)  Unsteadiness on feet  Other abnormalities of gait and mobility  Difficulty in walking, not elsewhere classified  Abnormality of gait and mobility  Rationale for Evaluation and Treatment: Rehabilitation  SUBJECTIVE:                                                                                                                                                                                             SUBJECTIVE STATEMENT:   Pt reports no falls or updates since last visit.    Pt accompanied by: self  PERTINENT HISTORY: Patient returning to PT s/p hospitalization for pneumonia, , SVT, ESRD on hypodialysis. PMH DM, HTN, Anemia, Bells palsy, Hypothyroidism, Kidney stones, stroke and pseudotumour cerebri. Macular degeneration with impaired visual acuity   PAIN:  Are you having pain? Some L foot numbness/neuropathy  PRECAUTIONS: Fall  RED FLAGS: None   WEIGHT BEARING RESTRICTIONS: No  FALLS: Has patient fallen in last 6 months? No  LIVING ENVIRONMENT: Lives with: lives alone Lives in: House/apartment Stairs: yes stairs in house Has following equipment at home: Single point cane  PLOF: Independent with basic ADLs  PATIENT GOALS: balance, strength  OBJECTIVE:  Note: Objective measures were completed at Evaluation unless otherwise noted.  DIAGNOSTIC FINDINGS: IMPRESSION: Interval resolution of previously noted trace left parafalcine subdural hematoma. No acute intracranial process.  COGNITION: Overall cognitive status: Within functional limits for tasks assessed   SENSATION: WFL  COORDINATION: Heel slide: WFL RLE; slight decrease with LLE   MUSCLE TONE: WFL  POSTURE: rounded shoulders, forward head, and posterior pelvic tilt  LOWER EXTREMITY MMT:    MMT Right Eval Left Eval  Hip flexion 4 4  Hip extension    Hip abduction 3  3  Hip adduction 3+ 3+  Hip internal rotation    Hip external rotation    Knee flexion 4- 4-  Knee extension 4 4  Ankle dorsiflexion 3 2+  Ankle plantarflexion 3+ 3  Ankle inversion    Ankle eversion    (Blank rows = not tested)  BED MOBILITY:  Not tested  TRANSFERS: Sit to stand: CGA  Assistive device utilized: None     Stand to sit: CGA  Assistive device utilized: None     Chair to chair: CGA  Assistive device utilized: None       RAMP:  Not tested  CURB:  Findings: needs UE support to step up  STAIRS: heavy BUE support step over step negotiation.  GAIT: Findings: Gait Characteristics: decreased stride length, Left foot flat, wide BOS, and poor foot clearance- Left, Distance walked: 60 ft, Assistive device utilized:None, Level of assistance: CGA, and Comments: increased L ankle rolling due to not wearing AFO  FUNCTIONAL TESTS:  5 times sit to stand: 30.53 with 4 posterior LOB 6 minute walk test: perform next session  Berg Balance Scale: 26  PATIENT SURVEYS:  LEFS give next session                                                                                                                               TREATMENT DATE: 07/21/24  Blaze Pods, Korebalance balance, strength, L ankle strength, stairs, LE endurance  TherAct: To improve functional movements patterns for everyday tasks  The patient completed 8 minutes at level(s) 5-9 on the NuStep using both BUE/BLE reciprocal movements to promote strength, endurance, and cardiorespiratory fitness. PT increased the resistance level and monitored the patient's response to the intervention throughout. The patient required min cueing for technique and supervisions level of assistance. Cues for LLE hip orientation to prevent Hip ER to challenge appropriate musculature  STS from chair with ball toss and catch once in standing, with normal basketball    NMR: To facilitate reeducation of movement, balance, posture,  coordination, and/or proprioception/kinesthetic sense.  Standing on airex pads, reaching for foam balls to right then rolling/throwing them at cones placed anterior in attempt to knowk them over x 20 reps - multiple LOB corrected with min A at times and self correction at times   Kore balance trainer working on patients proprioception and standing balance on dynamic surface  Setting type(s): pop dot, uses UE, working on proprioception and dynamic weight shifting   Reps: 16 Comments: improved weight shift with cues to not just shift at hips       PATIENT EDUCATION: Education details: goals, POC, HEP Person educated: Patient Education method: Explanation, Demonstration, Tactile cues, Verbal cues, and Handouts Education comprehension: verbalized understanding, returned demonstration, verbal cues required, tactile cues required, and needs further education  HOME EXERCISE PROGRAM: Access Code: [email or web address] URL: [email or web address] Date: 05/26/2024 Prepared by: Sidra Simpers  Exercises - Leg Extension  - 1 x daily - 7 x weekly - 2 sets - 10 reps - 5 hold - Seated March  - 1 x daily - 7 x weekly - 2 sets - 10 reps - 5 hold - Seated Heel Raise  - 1 x daily - 7 x weekly - 2 sets - 10 reps - 5 hold - Sit to Stand  - 1  x daily - 7 x weekly - 3 sets - 10 reps - Mini Squat with Counter Support  - 1 x daily - 7 x weekly - 3 sets - 10 reps - Standing 4-Way Leg Reach with Counter Support  - 1 x daily - 7 x weekly - 3 sets - 10 reps  GOALS: Goals reviewed with patient? Yes  SHORT TERM GOALS: Target date: 01/26/2024  Patient will be independent in home exercise program to improve strength/mobility for better functional independence with ADLs.  Baseline: Goal status: INITIAL  LONG TERM GOALS: Target date: 03/22/2024  Patient (< 20 years old) will complete five times sit to stand test in < 10 seconds indicating an increased LE strength and improved balance.  Baseline: 30.53 with 4  posterior LOB 03/29/24: 22.61 with 3 posterior LOB 06/21/24: 21.7 with 2 posterior LOB Goal status: PROGRESSING  2.   Patient will increase Berg Balance score by >45/56 to demonstrate decreased fall risk during functional activities.  Baseline: 5/5: 26  03/29/24: 32 Goal status: INITIAL  3.    Patient will increase six minute walk test distance to >1200 for progression to age norm community ambulator and improve gait ability  Baseline: perform next session 5/12: 710 ft  03/29/24: 770' 06/18/24: 792' Goal status: PROGRESSING  4.  Patient will score > 60/80 on LEFS for improved functional mobility and quality of life.  Baseline: perform next session 5/12: 43 03/29/24: 39/80 06/21/24: 38/80 Goal status: INITIAL    ASSESSMENT:  CLINICAL IMPRESSION:   Patient arrived with good motivation for completion of pt activities. Continued with dynamic balance and LE strength and functional mobility training.Pt challenged with NBOS and compliant surface and had difficulty with weight shift and proper strategies for balance recovery. Pt will continue to benefit from skilled physical therapy intervention to address impairments, improve QOL, and attain therapy goals.       OBJECTIVE IMPAIRMENTS: Abnormal gait, cardiopulmonary status limiting activity, decreased activity tolerance, decreased balance, decreased coordination, decreased endurance, decreased knowledge of use of DME, decreased mobility, difficulty walking, decreased ROM, decreased strength, dizziness, impaired perceived functional ability, impaired flexibility, impaired vision/preception, improper body mechanics, postural dysfunction, and obesity.   ACTIVITY LIMITATIONS: carrying, lifting, bending, sitting, standing, squatting, stairs, transfers, bed mobility, toileting, dressing, reach over head, hygiene/grooming, locomotion level, and caring for others  PARTICIPATION LIMITATIONS: meal prep, cleaning, laundry, driving, shopping, community  activity, occupation, and yard work  PERSONAL FACTORS: Age, Behavior pattern, Fitness, Past/current experiences, Time since onset of injury/illness/exacerbation, Transportation, and 3+ comorbidities: DM, HTN, Anemia, Bells palsy, Hypothyroidism, Kidney stones, stroke and pseudotumour cerebri. Macular degeneration with impaired visual acuity  are also affecting patient's functional outcome.   REHAB POTENTIAL: Good  CLINICAL DECISION MAKING: Evolving/moderate complexity  EVALUATION COMPLEXITY: Moderate  PLAN:  PT FREQUENCY: 2x/week  PT DURATION: 12 weeks  PLANNED INTERVENTIONS: 97164- PT Re-evaluation, 97750- Physical Performance Testing, 97110-Therapeutic exercises, 97530- Therapeutic activity, [id number]- Neuromuscular re-education, 97535- Self Care, [id number]- Manual therapy, [id number]- Gait training, [id number]- Orthotic Initial, [id number]- Orthotic/Prosthetic subsequent, [phone]- Canalith repositioning, [id number]- Splinting, H9716- Electrical stimulation (unattended), [phone]- Electrical stimulation (manual), [id number]- Vasopneumatic device, [id number]- Ultrasound, [id number]- Traction (mechanical), [id number]- Ionotophoresis 4mg /ml Dexamethasone , Patient/Family education, Balance training, Stair training, Taping, Dry Needling, Joint mobilization, Spinal mobilization, Scar mobilization, Compression bandaging, Vestibular training, Visual/preceptual remediation/compensation, Cognitive remediation, DME instructions, Cryotherapy, Moist heat, and Biofeedback  PLAN FOR NEXT SESSION:  Blaze Pods, Korebalance balance, strength, L ankle strength, stairs, LE endurance   Note: Portions of this document were prepared using [employer]  voice recognition software and although reviewed may contain unintentional dictation errors in syntax, grammar, or spelling.  Lonni KATHEE Gainer PT ,DPT Physical Therapist- [facility]   07/21/24, 11:40 AM

## 2024-07-26 ENCOUNTER — Ambulatory Visit: Attending: Internal Medicine

## 2024-07-26 VITALS — BP 167/67 | HR 62

## 2024-07-26 DIAGNOSIS — R262 Difficulty in walking, not elsewhere classified: Secondary | ICD-10-CM | POA: Diagnosis present

## 2024-07-26 DIAGNOSIS — R2689 Other abnormalities of gait and mobility: Secondary | ICD-10-CM | POA: Insufficient documentation

## 2024-07-26 DIAGNOSIS — M6281 Muscle weakness (generalized): Secondary | ICD-10-CM | POA: Diagnosis present

## 2024-07-26 DIAGNOSIS — R2681 Unsteadiness on feet: Secondary | ICD-10-CM | POA: Insufficient documentation

## 2024-07-26 DIAGNOSIS — R269 Unspecified abnormalities of gait and mobility: Secondary | ICD-10-CM | POA: Insufficient documentation

## 2024-07-26 DIAGNOSIS — R278 Other lack of coordination: Secondary | ICD-10-CM | POA: Insufficient documentation

## 2024-07-26 NOTE — Therapy (Signed)
 OUTPATIENT PHYSICAL THERAPY NEURO TREATMENT   Patient Name: Gregory Crane MRN: [id number] DOB:11-02-1970, 53 y.o., male Today's Date: 07/26/2024  PCP: Glendia Shad  REFERRING PROVIDER: Glendia Shad  END OF SESSION:  PT End of Session - 07/26/24 1158     Visit Number 37    Number of Visits 48    Date for Recertification  08/16/24    Progress Note Due on Visit 20    PT Start Time 1149    PT Stop Time 1230    PT Time Calculation (min) 41 min    Equipment Utilized During Treatment Gait belt    Activity Tolerance Patient tolerated treatment well    Behavior During Therapy [facility] for tasks assessed/performed          Past Medical History:  Diagnosis Date   Allergy    Anemia    Bell's palsy    Diabetes mellitus without complication (HCC)    diet controlled   Fall 07/2023   Hypertension    Hypothyroidism    Kidney stones    Pseudotumor cerebri    Stroke [facility])    Past Surgical History:  Procedure Laterality Date   COLONOSCOPY WITH PROPOFOL  N/A 03/30/2020   Procedure: COLONOSCOPY WITH PROPOFOL ;  Surgeon: Maryruth Ole DASEN, MD;  Location: [facility] ENDOSCOPY;  Service: Endoscopy;  Laterality: N/A;   CORONARY STENT INTERVENTION N/A 03/17/2024   Procedure: CORONARY STENT INTERVENTION;  Surgeon: Jordan, Peter M, MD;  Location: [facility] INVASIVE CV LAB;  Service: Cardiovascular;  Laterality: N/A;   LEFT HEART CATH AND CORONARY ANGIOGRAPHY N/A 03/17/2024   Procedure: LEFT HEART CATH AND CORONARY ANGIOGRAPHY;  Surgeon: Jordan, Peter M, MD;  Location: [facility] INVASIVE CV LAB;  Service: Cardiovascular;  Laterality: N/A;   LOOP RECORDER INSERTION N/A 01/27/2018   Procedure: LOOP RECORDER INSERTION;  Surgeon: Fernande Elspeth BROCKS, MD;  Location: [facility] INVASIVE CV LAB;  Service: Cardiovascular;  Laterality: N/A;   LUMBAR PUNCTURE     as child   NO PAST SURGERIES     REMOVAL OF A DIALYSIS CATHETER     TEE WITHOUT CARDIOVERSION N/A 01/07/2018   Procedure: TRANSESOPHAGEAL ECHOCARDIOGRAM (TEE);   Surgeon: Perla Evalene PARAS, MD;  Location: [facility] ORS;  Service: Cardiovascular;  Laterality: N/A;   Patient Active Problem List   Diagnosis Date Noted   Bradycardia 04/30/2024   CAD S/P percutaneous coronary angioplasty 03/17/2024   Non-ST elevation (NSTEMI) myocardial infarction (HCC) 03/16/2024   Hypervolemia 03/14/2024   Hypoxic respiratory failure (HCC) 03/14/2024   Left hip pain 01/30/2024   SVT (supraventricular tachycardia) 08/10/2023   Atrial fibrillation (HCC) 07/03/2023   Atrial fibrillation with RVR (HCC) 07/02/2023   Fall 06/15/2023   Non-compliance with renal dialysis 05/02/2023   Volume overload 04/29/2023   Renal osteodystrophy 11/25/2022   Unsteady gait 10/05/2022   Steal syndrome of dialysis vascular access 05/28/2022   Hydronephrosis, left 05/04/2022   Leukocytosis 05/04/2022   Diabetic retinopathy associated with diabetes mellitus due to underlying condition (HCC) 01/13/2022   ESRD on hemodialysis (HCC) 01/13/2022   Deafness in right ear 08/11/2021   History of colon polyps 10/15/2020   Type 2 diabetes mellitus, with long-term current use of insulin  (HCC) 09/09/2020   Cryptogenic stroke (HCC) 07/13/2020   History of loop recorder 07/13/2020   Lymphedema 06/08/2019   Anemia 04/17/2019   Swelling of both lower extremities 01/10/2019   Facial droop 04/09/2018   Daytime somnolence 03/30/2018   Carotid artery disease 02/03/2018   Intracranial vascular stenosis 09/12/2017   History  of CVA (cerebrovascular accident) 11/10/2016   Benign localized hyperplasia of prostate with urinary obstruction 10/27/2016   History of nephrolithiasis 10/27/2016   TIA (transient ischemic attack) 10/20/2016   Near syncope 06/23/2016   Organic impotence 10/01/2015   Neuropathy 08/06/2015   Health care maintenance 08/06/2015   Hypertension 08/06/2015   Hypothyroidism 10/10/2013   Microalbuminuria 10/10/2013   Hyperlipidemia 10/10/2013   B12 deficiency 10/10/2013   Environmental  allergies 07/04/2013    ONSET DATE: 3 years ago  REFERRING DIAG: unsteady gait  THERAPY DIAG:  Muscle weakness (generalized)  Unsteadiness on feet  Other abnormalities of gait and mobility  Difficulty in walking, not elsewhere classified  Abnormality of gait and mobility  Other lack of coordination  Rationale for Evaluation and Treatment: Rehabilitation  SUBJECTIVE:                                                                                                                                                                                             SUBJECTIVE STATEMENT:   Pt denies any falls and was able to go to his cousins to for Thanksgiving.  Pt accompanied by: self  PERTINENT HISTORY: Patient returning to PT s/p hospitalization for pneumonia, , SVT, ESRD on hypodialysis. PMH DM, HTN, Anemia, Bells palsy, Hypothyroidism, Kidney stones, stroke and pseudotumour cerebri. Macular degeneration with impaired visual acuity   PAIN:  Are you having pain? Some L foot numbness/neuropathy  PRECAUTIONS: Fall  RED FLAGS: None   WEIGHT BEARING RESTRICTIONS: No  FALLS: Has patient fallen in last 6 months? No  LIVING ENVIRONMENT: Lives with: lives alone Lives in: House/apartment Stairs: yes stairs in house Has following equipment at home: Single point cane  PLOF: Independent with basic ADLs  PATIENT GOALS: balance, strength  OBJECTIVE:  Note: Objective measures were completed at Evaluation unless otherwise noted.  DIAGNOSTIC FINDINGS: IMPRESSION: Interval resolution of previously noted trace left parafalcine subdural hematoma. No acute intracranial process.  COGNITION: Overall cognitive status: Within functional limits for tasks assessed   SENSATION: WFL  COORDINATION: Heel slide: WFL RLE; slight decrease with LLE   MUSCLE TONE: WFL  POSTURE: rounded shoulders, forward head, and posterior pelvic tilt  LOWER EXTREMITY MMT:    MMT Right Eval Left Eval   Hip flexion 4 4  Hip extension    Hip abduction 3 3  Hip adduction 3+ 3+  Hip internal rotation    Hip external rotation    Knee flexion 4- 4-  Knee extension 4 4  Ankle dorsiflexion 3 2+  Ankle plantarflexion 3+ 3  Ankle inversion    Ankle eversion    (Blank rows =  not tested)  BED MOBILITY:  Not tested  TRANSFERS: Sit to stand: CGA  Assistive device utilized: None     Stand to sit: CGA  Assistive device utilized: None     Chair to chair: CGA  Assistive device utilized: None       RAMP:  Not tested  CURB:  Findings: needs UE support to step up  STAIRS: heavy BUE support step over step negotiation.  GAIT: Findings: Gait Characteristics: decreased stride length, Left foot flat, wide BOS, and poor foot clearance- Left, Distance walked: 60 ft, Assistive device utilized:None, Level of assistance: CGA, and Comments: increased L ankle rolling due to not wearing AFO  FUNCTIONAL TESTS:  5 times sit to stand: 30.53 with 4 posterior LOB 6 minute walk test: perform next session  Berg Balance Scale: 26  PATIENT SURVEYS:  LEFS give next session                                                                                                                               TREATMENT DATE: 07/26/24  TherAct: To improve functional movements patterns for everyday tasks  The patient completed 10 minutes at level(s) 3-9 on the NuStep using both BUE/BLE reciprocal movements to promote strength, endurance, and cardiorespiratory fitness. PT increased the resistance level and monitored the patient's response to the intervention throughout. The patient required min cueing for technique and supervisions level of assistance. Cues for LLE hip orientation to prevent Hip ER to challenge appropriate musculature  STS attempts with use of the green balloon/ball however pt with increased difficulty performing, pt took several attempts to perform the task, totally ~6 STS's in total with toss and catch  to/from therapist  Vitals were assessed due to the pt noting some dizziness upon standing and noted below:  Today's Vitals   07/26/24 1217 07/26/24 1219  BP: (!) 170/64 (!) 167/67  Pulse: (!) 58 62  SpO2: 96% 98%         NMR: To facilitate reeducation of movement, balance, posture, coordination, and/or proprioception/kinesthetic sense.  Static standing bowling with use of the foam basketballs in attempt to knock over cones placed ~15' in front of pt.  Pt able to perform better today on the firm ground rather than the airex pad.       PATIENT EDUCATION: Education details: goals, POC, HEP Person educated: Patient Education method: Explanation, Demonstration, Tactile cues, Verbal cues, and Handouts Education comprehension: verbalized understanding, returned demonstration, verbal cues required, tactile cues required, and needs further education  HOME EXERCISE PROGRAM: Access Code: [email or web address] URL: [email or web address] Date: 05/26/2024 Prepared by: Sidra Simpers  Exercises - Leg Extension  - 1 x daily - 7 x weekly - 2 sets - 10 reps - 5 hold - Seated March  - 1 x daily - 7 x weekly - 2 sets - 10 reps - 5 hold - Seated Heel Raise  - 1 x daily - 7 x weekly - 2 sets -  10 reps - 5 hold - Sit to Stand  - 1 x daily - 7 x weekly - 3 sets - 10 reps - Mini Squat with Counter Support  - 1 x daily - 7 x weekly - 3 sets - 10 reps - Standing 4-Way Leg Reach with Counter Support  - 1 x daily - 7 x weekly - 3 sets - 10 reps  GOALS: Goals reviewed with patient? Yes  SHORT TERM GOALS: Target date: 01/26/2024  Patient will be independent in home exercise program to improve strength/mobility for better functional independence with ADLs.  Baseline: Goal status: INITIAL  LONG TERM GOALS: Target date: 03/22/2024  Patient (< 67 years old) will complete five times sit to stand test in < 10 seconds indicating an increased LE strength and improved balance.  Baseline: 30.53 with 4  posterior LOB 03/29/24: 22.61 with 3 posterior LOB 06/21/24: 21.7 with 2 posterior LOB Goal status: PROGRESSING  2.   Patient will increase Berg Balance score by >45/56 to demonstrate decreased fall risk during functional activities.  Baseline: 5/5: 26  03/29/24: 32 Goal status: INITIAL  3.    Patient will increase six minute walk test distance to >1200 for progression to age norm community ambulator and improve gait ability  Baseline: perform next session 5/12: 710 ft  03/29/24: 770' 06/18/24: 792' Goal status: PROGRESSING  4.  Patient will score > 60/80 on LEFS for improved functional mobility and quality of life.  Baseline: perform next session 5/12: 43 03/29/24: 39/80 06/21/24: 38/80 Goal status: INITIAL    ASSESSMENT:  CLINICAL IMPRESSION:   Pt performed well with the tasks given today, however noted to be somewhat dizzy when attempting to perform the STS's.  Pt's vitals were assessed and according to the pt, not out of the realm of normal for him.  BP was a little elevated according to the pt, but otherwise, he reports having a low HR when performing dialysis.  Pt did better with the balance activity while on firm surface as expected.   Pt will continue to benefit from skilled therapy to address remaining deficits in order to improve overall QoL and return to PLOF.         OBJECTIVE IMPAIRMENTS: Abnormal gait, cardiopulmonary status limiting activity, decreased activity tolerance, decreased balance, decreased coordination, decreased endurance, decreased knowledge of use of DME, decreased mobility, difficulty walking, decreased ROM, decreased strength, dizziness, impaired perceived functional ability, impaired flexibility, impaired vision/preception, improper body mechanics, postural dysfunction, and obesity.   ACTIVITY LIMITATIONS: carrying, lifting, bending, sitting, standing, squatting, stairs, transfers, bed mobility, toileting, dressing, reach over head, hygiene/grooming,  locomotion level, and caring for others  PARTICIPATION LIMITATIONS: meal prep, cleaning, laundry, driving, shopping, community activity, occupation, and yard work  PERSONAL FACTORS: Age, Behavior pattern, Fitness, Past/current experiences, Time since onset of injury/illness/exacerbation, Transportation, and 3+ comorbidities: DM, HTN, Anemia, Bells palsy, Hypothyroidism, Kidney stones, stroke and pseudotumour cerebri. Macular degeneration with impaired visual acuity  are also affecting patient's functional outcome.   REHAB POTENTIAL: Good  CLINICAL DECISION MAKING: Evolving/moderate complexity  EVALUATION COMPLEXITY: Moderate  PLAN:  PT FREQUENCY: 2x/week  PT DURATION: 12 weeks  PLANNED INTERVENTIONS: 97164- PT Re-evaluation, 97750- Physical Performance Testing, 97110-Therapeutic exercises, 97530- Therapeutic activity, [id number]- Neuromuscular re-education, 97535- Self Care, [id number]- Manual therapy, [id number]- Gait training, [id number]- Orthotic Initial, [id number]- Orthotic/Prosthetic subsequent, [phone]- Canalith repositioning, [id number]- Splinting, H9716- Electrical stimulation (unattended), [id number]- Electrical stimulation (manual), [id number]- Vasopneumatic device, [id number]- Ultrasound, [id number]- Traction (mechanical), [id number]- Ionotophoresis 4mg /ml Dexamethasone , Patient/Family education, Balance training,  Stair training, Taping, Dry Needling, Joint mobilization, Spinal mobilization, Scar mobilization, Compression bandaging, Vestibular training, Visual/preceptual remediation/compensation, Cognitive remediation, DME instructions, Cryotherapy, Moist heat, and Biofeedback  PLAN FOR NEXT SESSION:  Blaze Pods, Korebalance balance, strength, L ankle strength, stairs, LE endurance   Fonda LELON Simpers PT ,DPT Physical Therapist- [place]  [facility]  07/26/24, 1:17 PM

## 2024-07-27 ENCOUNTER — Other Ambulatory Visit: Payer: Self-pay | Admitting: Neurology

## 2024-07-27 DIAGNOSIS — R29810 Facial weakness: Secondary | ICD-10-CM

## 2024-07-28 ENCOUNTER — Ambulatory Visit

## 2024-07-30 ENCOUNTER — Other Ambulatory Visit: Payer: Self-pay

## 2024-07-30 ENCOUNTER — Telehealth: Payer: Self-pay

## 2024-07-30 NOTE — Patient Instructions (Signed)
 Norleen Anes [name] - I am sorry I was unable to reach you today for our scheduled appointment. I work with Glendia Shad, MD and am calling to support your healthcare needs. Please contact me at [phone] at your earliest convenience. I look forward to speaking with you soon.   Thank you,  Thersia Hoar, BSW, MHA [place]  [facility] Social Worker, Population Health [phone]

## 2024-07-30 NOTE — Patient Instructions (Signed)
 Visit Information  Thank you for taking time to visit with me today. Please don't hesitate to contact me if I can be of assistance to you before our next scheduled appointment.  Our next appointment is by telephone on 08/13/2024 at 9:30 am Please call the care guide team at [phone] if you need to cancel or reschedule your appointment.   Following is a copy of your care plan:   Goals Addressed             This Visit's Progress    [facility] RN Care Plan - ESRD - Risk of fluid overload/Falls       Problems:  Chronic Disease Management support and education needs related to ESRD and Risk of Fluid overload/Falls  Goal: Over the next 60 days the Patient will attend all scheduled medical appointments: Patient will attend all appointments as evidenced by chart review        continue to work with RN Care Manager and/or Social Worker to address care management and care coordination needs related to ESRD and Risk of Fluid overload/Falls as evidenced by adherence to care management team scheduled appointments     take all medications exactly as prescribed and will call provider for medication related questions as evidenced by patient report    verbalize basic understanding of ESRD and Risk of fluid overload/Falls disease process and self health management plan as evidenced by attending dialysis & rehab treatments (getting resources for transportation) follow HF Action Plan for symptoms of fluid overload to report promptly to provider, practice fall precautions, lifestyle modifications, following low salt/renal diet and fluid restriction, avoiding NSAIDs, exercise as tolerated.  Interventions:    Chronic Kidney Disease Interventions: Evaluation of current treatment plan related to chronic kidney disease self management and patient's adherence to plan as established by provider      Provided education to patient re: stroke prevention, s/s of heart attack and stroke    Reviewed prescribed diet low  salt/renal with 32 oz fluid restriction Reviewed medications with patient and discussed importance of compliance    Discussed plans with patient for ongoing care management follow up and provided patient with direct contact information for care management team    Screening for signs and symptoms of depression related to chronic disease state      Discussed the impact of chronic kidney disease on daily life and mental health and acknowledged and normalized feelings of disempowerment, fear, and frustration    Assessed social determinant of health barriers    Reviewed HF Action plan as resource for symptoms of fluid overload - sent via Mychart Last practice recorded BP readings:  BP Readings from Last 3 Encounters:  07/26/24 (!) 167/67  06/26/24 128/72  04/30/24 128/70   Most recent eGFR/CrCl: No results found for: EGFR  No components found for: CRCL    Falls Interventions: Provided written and verbal education re: potential causes of falls and Fall prevention strategies Reviewed medications and discussed potential side effects of medications such as dizziness and frequent urination Advised patient of importance of notifying provider of falls Assessed for signs and symptoms of orthostatic hypotension Assessed for falls since last encounter Assessed patients knowledge of fall risk prevention secondary to previously provided education Provided patient information for fall alert systems  Patient Self-Care Activities:  Attend all scheduled provider appointments Call pharmacy for medication refills 3-7 days in advance of running out of medications Call provider office for new concerns or questions  Take medications as prescribed   Work  with the social worker to address care coordination needs and will continue to work with the clinical team to address health care and disease management related needs Utilize HF Action plan to monitor for signs of Fluid Overload requiring call to provider  vs ED  Plan:  Telephone follow up appointment with care management team member scheduled for:  08/13/2024 at 9:30 am             Please call the Suicide and Crisis Lifeline: 988 call the [place]  National Suicide Prevention Lifeline: (715) 596-2863 or TTY: 423-204-9176 TTY 815-820-5655) to talk to a trained counselor call 1-800-273-TALK (toll free, 24 hour hotline) go to [facility] [address], [place] [phone]) call 911 if you are experiencing a Mental Health or Behavioral Health Crisis or need someone to talk to.  Patient verbalized understanding of Care plan and visit instructions communicated this visit  Nestora Duos, MSN, RN [facility]  [facility], [facility] RN Care Manager Direct Dial: [phone] Fax: [phone]  Heart Failure Action Plan A heart failure action plan helps you know what to do when you have symptoms of heart failure. Your action plan is a color-coded plan that lists the symptoms to watch for and indicates what actions to take. If you have symptoms in the green zone, you're doing well. If you have symptoms in the yellow zone, you're having problems. If you have symptoms in the red zone, you need medical care right away. Follow the plan that was created by you and your health care provider. Review your plan each time you visit your provider. Green zone These signs mean you're doing well and can continue what you're doing: You don't have new or worsening shortness of breath. You have very little swelling or no new swelling. Your weight is stable (no gain or loss). You have a normal activity level. You don't have chest pain or any other new symptoms. Yellow zone These signs and symptoms mean your condition may be getting worse and you should make some changes: You have trouble breathing when you're active. You have swelling in your feet or legs or have discomfort in your  belly. You gain 2-3 lb (0.9-1.4 kg) in 24 hours, or 5 lb (2.3 kg) in a week. This amount may be more or less depending on your condition. You get tired easily. You have trouble sleeping. You have a dry cough. If you have any of these symptoms: Contact your provider within the next day. Your provider may adjust your medicines. Red zone These signs and symptoms mean you should get medical help right away: You have trouble breathing when resting or cannot lie flat and you need to raise your head to help you breathe. You have a dry cough that's getting worse. You have swelling or pain in your feet or legs or discomfort in your belly that's getting worse. You suddenly gain more than 2-3 lb (0.9-1.4 kg) in 24 hours, or more than 5 lb (2.3 kg) in a week. This amount may be more or less depending on your condition. You have trouble staying awake or you feel confused. You don't have an appetite. You have worsening sadness or depression. These symptoms may be an emergency. Call 911 right away. Do not wait to see if the symptoms will go away. Do not drive yourself to the hospital. Follow these instructions at home: Take medicines only as told. Eat a heart-healthy diet. Work with a dietitian to create an eating plan  that's best for you. Weigh yourself each day. Your target weight is __________ lb (__________ kg). Call your provider if you gain more than __________ lb (__________ kg) in 24 hours, or more than __________ lb (__________ kg) in a week. Health care provider name: _____________________________________________________ Health care provider phone number: _____________________________________________________ Where to find more information American Heart Association: heart.org This information is not intended to replace advice given to you by your health care provider. Make sure you discuss any questions you have with your health care provider. Document Revised: 03/27/2023 Document Reviewed:  03/27/2023 Elsevier Patient Education  2024 Elsevier Inc.Managing Your Hypertension Hypertension, also called high blood pressure, is when the force of the blood pressing against the walls of the arteries is too strong. Arteries are blood vessels that carry blood from your heart throughout your body. Hypertension forces the heart to work harder to pump blood and may cause the arteries to become narrow or stiff. Understanding blood pressure readings A blood pressure reading includes a higher number over a lower number: The first, or top, number is called the systolic pressure. It is a measure of the pressure in your arteries as your heart beats. The second, or bottom number, is called the diastolic pressure. It is a measure of the pressure in your arteries as the heart relaxes. For most people, a normal blood pressure is below 120/80. Your personal target blood pressure may vary depending on your medical conditions, your age, and other factors. Blood pressure is classified into four stages. Based on your blood pressure reading, your health care provider may use the following stages to determine what type of treatment you need, if any. Systolic pressure and diastolic pressure are measured in a unit called millimeters of mercury (mmHg). Normal Systolic pressure: below 120. Diastolic pressure: below 80. Elevated Systolic pressure: 120-129. Diastolic pressure: below 80. Hypertension stage 1 Systolic pressure: 130-139. Diastolic pressure: 80-89. Hypertension stage 2 Systolic pressure: 140 or above. Diastolic pressure: 90 or above. How can this condition affect me? Managing your hypertension is very important. Over time, hypertension can damage the arteries and decrease blood flow to parts of the body, including the brain, heart, and kidneys. Having untreated or uncontrolled hypertension can lead to: A heart attack. A stroke. A weakened blood vessel (aneurysm). Heart failure. Kidney damage. Eye  damage. Memory and concentration problems. Vascular dementia. What actions can I take to manage this condition? Hypertension can be managed by making lifestyle changes and possibly by taking medicines. Your health care provider will help you make a plan to bring your blood pressure within a normal range. You may be referred for counseling on a healthy diet and physical activity. Nutrition  Eat a diet that is high in fiber and potassium, and low in salt (sodium), added sugar, and fat. An example eating plan is called the DASH diet. DASH stands for Dietary Approaches to Stop Hypertension. To eat this way: Eat plenty of fresh fruits and vegetables. Try to fill one-half of your plate at each meal with fruits and vegetables. Eat whole grains, such as whole-wheat pasta, brown rice, or whole-grain bread. Fill about one-fourth of your plate with whole grains. Eat low-fat dairy products. Avoid fatty cuts of meat, processed or cured meats, and poultry with skin. Fill about one-fourth of your plate with lean proteins such as fish, chicken without skin, beans, eggs, and tofu. Avoid pre-made and processed foods. These tend to be higher in sodium, added sugar, and fat. Reduce your daily sodium intake. Many  people with hypertension should eat less than 1,500 mg of sodium a day. Lifestyle  Work with your health care provider to maintain a healthy body weight or to lose weight. Ask what an ideal weight is for you. Get at least 30 minutes of exercise that causes your heart to beat faster (aerobic exercise) most days of the week. Activities may include walking, swimming, or biking. Include exercise to strengthen your muscles (resistance exercise), such as weight lifting, as part of your weekly exercise routine. Try to do these types of exercises for 30 minutes at least 3 days a week. Do not use any products that contain nicotine or tobacco. These products include cigarettes, chewing tobacco, and vaping devices, such  as e-cigarettes. If you need help quitting, ask your health care provider. Control any long-term (chronic) conditions you have, such as high cholesterol or diabetes. Identify your sources of stress and find ways to manage stress. This may include meditation, deep breathing, or making time for fun activities. Alcohol use Do not drink alcohol if: Your health care provider tells you not to drink. You are pregnant, may be pregnant, or are planning to become pregnant. If you drink alcohol: Limit how much you have to: 0-1 drink a day for women. 0-2 drinks a day for men. Know how much alcohol is in your drink. In the [place] one drink equals one 12 oz bottle of beer (355 mL), one 5 oz glass of wine (148 mL), or one 1 oz glass of hard liquor (44 mL). Medicines Your health care provider may prescribe medicine if lifestyle changes are not enough to get your blood pressure under control and if: Your systolic blood pressure is 130 or higher. Your diastolic blood pressure is 80 or higher. Take medicines only as told by your health care provider. Follow the directions carefully. Blood pressure medicines must be taken as told by your health care provider. The medicine does not work as well when you skip doses. Skipping doses also puts you at risk for problems. Monitoring Before you monitor your blood pressure: Do not smoke, drink caffeinated beverages, or exercise within 30 minutes before taking a measurement. Use the bathroom and empty your bladder (urinate). Sit quietly for at least 5 minutes before taking measurements. Monitor your blood pressure at home as told by your health care provider. To do this: Sit with your back straight and supported. Place your feet flat on the floor. Do not cross your legs. Support your arm on a flat surface, such as a table. Make sure your upper arm is at heart level. Each time you measure, take two or three readings one minute apart and record the results. You may also  need to have your blood pressure checked regularly by your health care provider. General information Talk with your health care provider about your diet, exercise habits, and other lifestyle factors that may be contributing to hypertension. Review all the medicines you take with your health care provider because there may be side effects or interactions. Keep all follow-up visits. Your health care provider can help you create and adjust your plan for managing your high blood pressure. Where to find more information National Heart, Lung, and Blood Institute: popsteam.is American Heart Association: www.heart.org Contact a health care provider if: You think you are having a reaction to medicines you have taken. You have repeated (recurrent) headaches. You feel dizzy. You have swelling in your ankles. You have trouble with your vision. Get help right away if: You develop  a severe headache or confusion. You have unusual weakness or numbness, or you feel faint. You have severe pain in your chest or abdomen. You vomit repeatedly. You have trouble breathing. These symptoms may be an emergency. Get help right away. Call 911. Do not wait to see if the symptoms will go away. Do not drive yourself to the hospital. Summary Hypertension is when the force of blood pumping through your arteries is too strong. If this condition is not controlled, it may put you at risk for serious complications. Your personal target blood pressure may vary depending on your medical conditions, your age, and other factors. For most people, a normal blood pressure is less than 120/80. Hypertension is managed by lifestyle changes, medicines, or both. Lifestyle changes to help manage hypertension include losing weight, eating a healthy, low-sodium diet, exercising more, stopping smoking, and limiting alcohol. This information is not intended to replace advice given to you by your health care provider. Make sure you  discuss any questions you have with your health care provider. Document Revised: 04/26/2021 Document Reviewed: 04/26/2021 Elsevier Patient Education  2024 Elsevier Inc.   Eating Plan for Hemodialysis When you get hemodialysis, you need to watch what you eat. This is because extra fluid and waste can build up in your body between treatments. This buildup can make you sick. Watching what you eat and drink can help control the buildup of fluid and waste and help you feel better. Below are tips on eating well while getting hemodialysis. Your treatment team or a food expert called a dietitian will teach you more about eating well. You'll be told about: What you should eat. What foods and drinks you should limit or avoid. How much liquid to drink each day. Meal planning. Keeping a diary of what you eat and drink. Tracking your weight to watch for fluid changes. Helpful tips Read food labels  Talk with your treatment team about what you should look for on food labels. You'll be told about any foods you should limit. For instance, you might be told to check food labels for salt, or sodium, content. There tends to be a lot of salt in processed or cured meats, frozen meals, canned vegetables, and salty snack foods. Salt is also in ketchup, mustard, soy sauce, and some seasonings. You may need to look for no salt added or low sodium labels. You might also be told to limit the amount of added phosphorus and potassium in your diet. Look at the ingredients lists on food labels. If you see words with phos in them, phosphorus has been added. Limit these foods. Salt substitutes tend to have a lot of potassium. Do not use those that have potassium or potassium chloride  in the ingredients list. Try herbs and other spice blends instead. Keep track of how much you drink You may be told how much liquid you can drink each day. You'll also need to watch for foods that are mostly liquid, like soup, gelatin,  and juicy fruits and vegetables. Limiting your liquid intake can help keep extra fluid from building up in your body. It can also help hemodialysis work better. This is because only a certain amount of fluid can be safely removed during each treatment. Still, too much fluid in your body can be hard on your heart. So, if you drink too much liquid, extra fluid may need to be removed during treatment. Doing this may make you feel bad during treatment. You may get an upset stomach, have  muscle cramps, or have a sudden drop in blood pressure that makes you feel like you're going to pass out. Plan your meals Talk with your dietitian about what type and amount of food to eat. Most people on dialysis should try to eat: 6-11 servings of grains each day. One serving = 1 slice of bread or  cup of cooked rice or pasta. 2-3 servings of vegetables each day. One serving =  cup. 2-3 servings of fruits each day. One serving =  cup. Protein, such as meat, poultry, fish, eggs, beans, or lentils. Ask what types and amounts of protein to eat. -1 cup (118-236 mL) of dairy each day. Vitamins You may have trouble getting the vitamins and minerals your body needs because you must limit so many foods. Vitamins are also removed during hemodialysis. Your health care provider may give you a prescription vitamin to take every day. Do not take vitamins you can buy over the counter. They're not made for people on dialysis and can cause harm. What foods should I eat? Fruits Apples. Fresh or frozen berries. Fresh or canned pears and pineapple. Grapes. Plums. Vegetables Fresh or frozen broccoli, carrots, and green beans. Cabbage. Cauliflower. Celery. Cucumbers. Eggplant. Radishes. Grains Whole-grain breads, cereals, pasta, and crackers. Meats and other proteins Fresh or frozen beef, pork, poultry, and fish. Eggs. Dried beans, peas, and lentils. Tofu. Dairy Limited amounts of milk, half-and-half, yogurt, pudding, ice cream,  or natural cheese such as cheddar, mozzarella, or Swiss. Beverages Apple cider. Cranberry or grape juice. Lemonade. Black coffee. Rice, almond, oat, or soy milk that has not had phosphorus added to it (not enriched or fortified). Seasonings and condiments Herbs. Spices. Jam and jelly. Honey. Hot sauce. Sweets and desserts Sherbet. Cakes. Cookies. Fats and oils Olive oil, canola oil, and safflower oil. The items listed above may not be all the foods and drinks you can have. Talk with a dietitian to learn more. What foods should I limit? Fruits Do not eat starfruit. It can be toxic for people with kidney problems. Limit dried fruit, kiwi, and bananas because they contain a lot of potassium. Vegetables Potatoes. Beets. Tomatoes. Winter squash and pumpkin. Asparagus. Spinach. Parsnips. Avocados. Grains White or processed grains. Meats and other proteins Precooked, canned, smoked, and cured meats. Packaged meat. Sardines. Dairy Processed cheese (American cheese and soft cheeses that are not refrigerated). Cheese spreads and dips. Beverages Orange juice. Prune juice. Fizzy dark sodas or soft drinks with added phosphorus or phosphates. Seasonings and condiments Salt. Salt substitutes that contain potassium. Soy sauce. Teriyaki sauce. Most coffee creamer. Sweets and desserts Ice cream. Chocolate. Candied nuts. Nondairy whipped topping. Fats and oils All are okay in limited amounts. Other Ready-made frozen meals, except those approved by your dietitian. Canned soups. The items listed above may not be all the foods and drinks you should limit. Talk with a dietitian to learn more. To learn more You can learn more about hemodialysis and eating well at: [facility] of Diabetes and Digestive and Kidney Diseases: stagesync.si National Kidney Foundation: kidney.org This information is not intended to replace advice given to you by your health care provider. Make sure you discuss any  questions you have with your health care provider. Document Revised: 12/02/2022 Document Reviewed: 12/02/2022 Elsevier Patient Education  2024 [employer].   Fall Prevention in the Home, Adult Falls can cause injuries and can happen to people of all ages. There are many things you can do to make your home safer and to  help prevent falls. What actions can I take to prevent falls? General information Use good lighting in all rooms. Make sure to: Replace any light bulbs that burn out. Turn on the lights in dark areas and use night-lights. Keep items that you use often in easy-to-reach places. Lower the shelves around your home if needed. Move furniture so that there are clear paths around it. Do not use throw rugs or other things on the floor that can make you trip. If any of your floors are uneven, fix them. Add color or contrast paint or tape to clearly mark and help you see: Grab bars or handrails. First and last steps of staircases. Where the edge of each step is. If you use a ladder or stepladder: Make sure that it is fully opened. Do not climb a closed ladder. Make sure the sides of the ladder are locked in place. Have someone hold the ladder while you use it. Know where your pets are as you move through your home. What can I do in the bathroom?     Keep the floor dry. Clean up any water on the floor right away. Remove soap buildup in the bathtub or shower. Buildup makes bathtubs and showers slippery. Use non-skid mats or decals on the floor of the bathtub or shower. Attach bath mats securely with double-sided, non-slip rug tape. If you need to sit down in the shower, use a non-slip stool. Install grab bars by the toilet and in the bathtub and shower. Do not use towel bars as grab bars. What can I do in the bedroom? Make sure that you have a light by your bed that is easy to reach. Do not use any sheets or blankets on your bed that hang to the floor. Have a firm chair or  bench with side arms that you can use for support when you get dressed. What can I do in the kitchen? Clean up any spills right away. If you need to reach something above you, use a step stool with a grab bar. Keep electrical cords out of the way. Do not use floor polish or wax that makes floors slippery. What can I do with my stairs? Do not leave anything on the stairs. Make sure that you have a light switch at the top and the bottom of the stairs. Make sure that there are handrails on both sides of the stairs. Fix handrails that are broken or loose. Install non-slip stair treads on all your stairs if they do not have carpet. Avoid having throw rugs at the top or bottom of the stairs. Choose a carpet that does not hide the edge of the steps on the stairs. Make sure that the carpet is firmly attached to the stairs. Fix carpet that is loose or worn. What can I do on the outside of my home? Use bright outdoor lighting. Fix the edges of walkways and driveways and fix any cracks. Clear paths of anything that can make you trip, such as tools or rocks. Add color or contrast paint or tape to clearly mark and help you see anything that might make you trip as you walk through a door, such as a raised step or threshold. Trim any bushes or trees on paths to your home. Check to see if handrails are loose or broken and that both sides of all steps have handrails. Install guardrails along the edges of any raised decks and porches. Have leaves, snow, or ice cleared regularly. Use sand, salt,  or ice melter on paths if you live where there is ice and snow during the winter. Clean up any spills in your garage right away. This includes grease or oil spills. What other actions can I take? Review your medicines with your doctor. Some medicines can cause dizziness or changes in blood pressure, which increase your risk of falling. Wear shoes that: Have a low heel. Do not wear high heels. Have rubber bottoms and  are closed at the toe. Feel good on your feet and fit well. Use tools that help you move around if needed. These include: Canes. Walkers. Scooters. Crutches. Ask your doctor what else you can do to help prevent falls. This may include seeing a physical therapist to learn to do exercises to move better and get stronger. Where to find more information Centers for Disease Control and Prevention, STEADI: tonerpromos.no [employer] on Aging: baseringtones.pl National Institute on Aging: baseringtones.pl Contact a doctor if: You are afraid of falling at home. You feel weak, drowsy, or dizzy at home. You fall at home. Get help right away if you: Lose consciousness or have trouble moving after a fall. Have a fall that causes a head injury. These symptoms may be an emergency. Get help right away. Call 911. Do not wait to see if the symptoms will go away. Do not drive yourself to the hospital. This information is not intended to replace advice given to you by your health care provider. Make sure you discuss any questions you have with your health care provider. Document Revised: 04/15/2022 Document Reviewed: 04/15/2022 Elsevier Patient Education  2024 [employer].

## 2024-07-30 NOTE — Patient Outreach (Signed)
 Complex Care Management   Visit Note  07/30/2024  Name:  Gregory Crane MRN: [id number] DOB: 05-15-1971  Situation: Referral received for Complex Care Management related to ESRD - Risk of Falls and Fluid Overload I obtained verbal consent from Patient.  Visit completed with Patient  on the phone  Background:   Past Medical History:  Diagnosis Date   Allergy    Anemia    Bell's palsy    Diabetes mellitus without complication (HCC)    diet controlled   Fall 2023-08-13   Hypertension    Hypothyroidism    Kidney stones    Pseudotumor cerebri    Stroke [facility])     Assessment: Patient Reported Symptoms:  Cognitive Cognitive Status: Alert and oriented to person, place, and time, Insightful and able to interpret abstract concepts, Normal speech and language skills Cognitive/Intellectual Conditions Management [RPT]: None reported or documented in medical history or problem list   Health Maintenance Behaviors: Annual physical exam, Exercise, Social activities (has podcast - church activities) Healing Pattern: Average Health Facilitated by: Prayer/meditation  Neurological Neurological Review of Symptoms: Numbness, Other: Neurological Comment: neuropathy in feet impacting ambulation, Lyrica  helps somewhat, lightheaded at times after dialysis or quick turns - fall precautions reviewed carries cell phone most time - encouraged to always have on hand  HEENT HEENT Symptoms Reported: Other: HEENT Comment: retinopathy injctions q 3 months    Cardiovascular Cardiovascular Symptoms Reported: No symptoms reported Does patient have uncontrolled Hypertension?: No Cardiovascular Management Strategies: Medication therapy, Routine screening, Fluid modification, Diet modification Cardiovascular Comment: hx hospitalization for resp fail fluid overload  - discussed sx of fluid overload reviewed  will send HF Action Plan for reference  Respiratory Respiratory Symptoms Reported: No symptoms reported Other  Respiratory Symptoms: chest cold for a few days - congestion coughing, productive for clear mucous, no treatment, sx to call provider, and red flags for ED Additional Respiratory Details: sleep study pending Respiratory Management Strategies: Routine screening  Endocrine Endocrine Symptoms Reported: Not assessed Is patient diabetic?: No Endocrine Comment: have BG machine no insulin  for > year  Gastrointestinal Gastrointestinal Symptoms Reported: No symptoms reported      Genitourinary Genitourinary Symptoms Reported: No symptoms reported Additional Genitourinary Details: still urinates on dialysis Genitourinary Management Strategies: Hemodialysis Hemodialysis Schedule: T/R [facility] Hemodialysis Last Treatment: 07/29/24 Genitourinary Comment: Hemo Access - fistula on Left no concerns  Integumentary Integumentary Symptoms Reported: No symptoms reported Skin Management Strategies: Routine screening  Musculoskeletal Musculoskelatal Symptoms Reviewed: Weakness Additional Musculoskeletal Details: CVA left sided weakness Rehab 2x week [facility] Musculoskeletal Management Strategies: Routine screening Musculoskeletal Self-Management Outcome: 4 (good) Falls in the past year?: Yes Number of falls in past year: 1 or less Was there an injury with Fall?: No Fall Risk Category Calculator: 1 Patient Fall Risk Level: Low Fall Risk Patient at Risk for Falls Due to: History of fall(s), Impaired balance/gait Fall risk Follow up: Falls evaluation completed, Falls prevention discussed, Education provided  Psychosocial Psychosocial Symptoms Reported: Anxiety - if selected complete GAD Additional Psychological Details: wife died in 08-12-20, was in grief counseling, no needs at this time, little bit of anxiety Behavioral Management Strategies: Activity Behavioral Health Comment: goes to church weekly and talks to people there Major Change/Loss/Stressor/Fears (CP): Death of a loved one, Medical condition,  self Techniques to [employer] with Loss/Stress/Change: Diversional activities, Exercise, Spiritual practice(s) Quality of Family Relationships: supportive, helpful (2 stepdaughters - unsure how to rate relationship  not closeby) Do you feel physically threatened by  others?: No    07/30/2024    PHQ2-9 Depression Screening   Little interest or pleasure in doing things Not at all  Feeling down, depressed, or hopeless Several days  PHQ-2 - Total Score 1  Trouble falling or staying asleep, or sleeping too much    Feeling tired or having little energy    Poor appetite or overeating     Feeling bad about yourself - or that you are a failure or have let yourself or your family down    Trouble concentrating on things, such as reading the newspaper or watching television    Moving or speaking so slowly that other people could have noticed.  Or the opposite - being so fidgety or restless that you have been moving around a lot more than usual    Thoughts that you would be better off dead, or hurting yourself in some way    PHQ2-9 Total Score    If you checked off any problems, how difficult have these problems made it for you to do your work, take care of things at home, or get along with other people    Depression Interventions/Treatment      There were no vitals filed for this visit.    Medications Reviewed Today     Reviewed by Devra Lands, RN (Registered Nurse) on 07/30/24 at 1419  Med List Status: <None>   Medication Order Taking? Sig Documenting Provider Last Dose Status Informant  amiodarone  (PACERONE ) 200 MG tablet [id number] Yes Take 1 tablet (200 mg total) by mouth 2 (two) times daily. Darci Pore, MD  Active   apixaban  (ELIQUIS ) 5 MG TABS tablet [id number] Yes Take 1 tablet (5 mg total) by mouth 2 (two) times daily. Glendia Shad, MD  Active   aspirin  EC 81 MG tablet [id number] Yes Take 1 tablet (81 mg total) by mouth daily. Swallow whole. Darci Pore, MD  Active    atorvastatin  (LIPITOR ) 40 MG tablet [id number] Yes Take 1 tablet (40 mg total) by mouth daily. Darci Pore, MD  Active   calcium  carbonate (TUMS - DOSED IN MG ELEMENTAL CALCIUM ) 500 MG chewable tablet [id number] Yes Chew 2 tablets by mouth 3 (three) times daily with meals. [provider]  Active Self  clopidogrel  (PLAVIX ) 75 MG tablet [id number] Yes Take 1 tablet (75 mg total) by mouth daily with breakfast. Glendia Shad, MD  Active   diltiazem  (CARDIZEM  CD) 120 MG 24 hr capsule [id number] Yes Take 1 capsule (120 mg total) by mouth daily. Amin, Sumayya, MD  Active Self  dorzolamide -timolol  (COSOPT ) 2-0.5 % ophthalmic solution [id number] Yes Place 1 drop into both eyes 2 (two) times daily. [provider]  Active Self, Pharmacy Records  furosemide  (LASIX ) 80 MG tablet [id number] Yes Take 1 tablet (80 mg total) by mouth daily. Glendia Shad, MD  Active   hydrALAZINE  (APRESOLINE ) 25 MG tablet [id number] Yes Take 1 tablet (25 mg total) by mouth 2 (two) times daily. Glendia Shad, MD  Active   levothyroxine  (SYNTHROID ) 200 MCG tablet [id number] Yes TAKE 1 TABLET (200 MCG) BY MOUTH ONCE DAILY BEFORE BREAKFAST. Glendia Shad, MD  Active   Metoprolol  Tartrate 37.5 MG TABS [id number] Yes Take 1 tablet (37.5 mg total) by mouth 2 (two) times daily. Glendia Shad, MD  Active   pregabalin  (LYRICA ) 100 MG capsule [id number] Yes TAKE 1 CAPSULE(100 MG) BY MOUTH AT BEDTIME Glendia Shad, MD  Active             Recommendation:   PCP  Follow-up Continue Current Plan of Care BSW re transportation - phone call 08/06/24 at 10:00 am  Follow Up Plan:   Telephone follow-up 2 Gari Trovato  Nestora Duos, MSN, RN [facility] [place], [facility] RN Care Manager Direct Dial: [phone] Fax: [phone]

## 2024-07-31 ENCOUNTER — Ambulatory Visit
Admission: RE | Admit: 2024-07-31 | Discharge: 2024-07-31 | Disposition: A | Source: Ambulatory Visit | Attending: Neurology | Admitting: Neurology

## 2024-07-31 DIAGNOSIS — R29810 Facial weakness: Secondary | ICD-10-CM

## 2024-08-02 ENCOUNTER — Ambulatory Visit

## 2024-08-02 NOTE — Therapy (Incomplete)
 OUTPATIENT PHYSICAL THERAPY NEURO TREATMENT   Patient Name: Gregory Crane MRN: [id number] DOB:10/19/1970, 53 y.o., male Today's Date: 08/02/2024  PCP: Glendia Shad  REFERRING PROVIDER: Glendia Shad  END OF SESSION:    Past Medical History:  Diagnosis Date   Allergy    Anemia    Bell's palsy    Diabetes mellitus without complication (HCC)    diet controlled   Fall 07/2023   Hypertension    Hypothyroidism    Kidney stones    Pseudotumor cerebri    Stroke [facility])    Past Surgical History:  Procedure Laterality Date   COLONOSCOPY WITH PROPOFOL  N/A 03/30/2020   Procedure: COLONOSCOPY WITH PROPOFOL ;  Surgeon: Maryruth Ole DASEN, MD;  Location: [facility] ENDOSCOPY;  Service: Endoscopy;  Laterality: N/A;   CORONARY STENT INTERVENTION N/A 03/17/2024   Procedure: CORONARY STENT INTERVENTION;  Surgeon: Jordan, Peter M, MD;  Location: [facility] INVASIVE CV LAB;  Service: Cardiovascular;  Laterality: N/A;   LEFT HEART CATH AND CORONARY ANGIOGRAPHY N/A 03/17/2024   Procedure: LEFT HEART CATH AND CORONARY ANGIOGRAPHY;  Surgeon: Jordan, Peter M, MD;  Location: [facility] INVASIVE CV LAB;  Service: Cardiovascular;  Laterality: N/A;   LOOP RECORDER INSERTION N/A 01/27/2018   Procedure: LOOP RECORDER INSERTION;  Surgeon: Fernande Elspeth BROCKS, MD;  Location: [facility] INVASIVE CV LAB;  Service: Cardiovascular;  Laterality: N/A;   LUMBAR PUNCTURE     as child   NO PAST SURGERIES     REMOVAL OF A DIALYSIS CATHETER     TEE WITHOUT CARDIOVERSION N/A 01/07/2018   Procedure: TRANSESOPHAGEAL ECHOCARDIOGRAM (TEE);  Surgeon: Perla Evalene PARAS, MD;  Location: [facility] ORS;  Service: Cardiovascular;  Laterality: N/A;   Patient Active Problem List   Diagnosis Date Noted   Bradycardia 04/30/2024   CAD S/P percutaneous coronary angioplasty 03/17/2024   Non-ST elevation (NSTEMI) myocardial infarction (HCC) 03/16/2024   Hypervolemia 03/14/2024   Hypoxic respiratory failure (HCC) 03/14/2024   Left hip pain 01/30/2024   SVT  (supraventricular tachycardia) 08/10/2023   Atrial fibrillation (HCC) 07/03/2023   Atrial fibrillation with RVR (HCC) 07/02/2023   Fall 06/15/2023   Non-compliance with renal dialysis 05/02/2023   Volume overload 04/29/2023   Renal osteodystrophy 11/25/2022   Unsteady gait 10/05/2022   Steal syndrome of dialysis vascular access 05/28/2022   Hydronephrosis, left 05/04/2022   Leukocytosis 05/04/2022   Diabetic retinopathy associated with diabetes mellitus due to underlying condition (HCC) 01/13/2022   ESRD on hemodialysis (HCC) 01/13/2022   Deafness in right ear 08/11/2021   History of colon polyps 10/15/2020   Type 2 diabetes mellitus, with long-term current use of insulin  (HCC) 09/09/2020   Cryptogenic stroke (HCC) 07/13/2020   History of loop recorder 07/13/2020   Lymphedema 06/08/2019   Anemia 04/17/2019   Swelling of both lower extremities 01/10/2019   Facial droop 04/09/2018   Daytime somnolence 03/30/2018   Carotid artery disease 02/03/2018   Intracranial vascular stenosis 09/12/2017   History of CVA (cerebrovascular accident) 11/10/2016   Benign localized hyperplasia of prostate with urinary obstruction 10/27/2016   History of nephrolithiasis 10/27/2016   TIA (transient ischemic attack) 10/20/2016   Near syncope 06/23/2016   Organic impotence 10/01/2015   Neuropathy 08/06/2015   Health care maintenance 08/06/2015   Hypertension 08/06/2015   Hypothyroidism 10/10/2013   Microalbuminuria 10/10/2013   Hyperlipidemia 10/10/2013   B12 deficiency 10/10/2013   Environmental allergies 07/04/2013    ONSET DATE: 3 years ago  REFERRING DIAG: unsteady gait  THERAPY DIAG:  No diagnosis found.  Rationale for Evaluation and Treatment: Rehabilitation  SUBJECTIVE:                                                                                                                                                                                             SUBJECTIVE STATEMENT:    ***  Pt accompanied by: self  PERTINENT HISTORY: Patient returning to PT s/p hospitalization for pneumonia, , SVT, ESRD on hypodialysis. PMH DM, HTN, Anemia, Bells palsy, Hypothyroidism, Kidney stones, stroke and pseudotumour cerebri. Macular degeneration with impaired visual acuity   PAIN:  Are you having pain? Some L foot numbness/neuropathy  PRECAUTIONS: Fall  RED FLAGS: None   WEIGHT BEARING RESTRICTIONS: No  FALLS: Has patient fallen in last 6 months? No  LIVING ENVIRONMENT: Lives with: lives alone Lives in: House/apartment Stairs: yes stairs in house Has following equipment at home: Single point cane  PLOF: Independent with basic ADLs  PATIENT GOALS: balance, strength  OBJECTIVE:  Note: Objective measures were completed at Evaluation unless otherwise noted.  DIAGNOSTIC FINDINGS: IMPRESSION: Interval resolution of previously noted trace left parafalcine subdural hematoma. No acute intracranial process.  COGNITION: Overall cognitive status: Within functional limits for tasks assessed   SENSATION: WFL  COORDINATION: Heel slide: WFL RLE; slight decrease with LLE   MUSCLE TONE: WFL  POSTURE: rounded shoulders, forward head, and posterior pelvic tilt  LOWER EXTREMITY MMT:    MMT Right Eval Left Eval  Hip flexion 4 4  Hip extension    Hip abduction 3 3  Hip adduction 3+ 3+  Hip internal rotation    Hip external rotation    Knee flexion 4- 4-  Knee extension 4 4  Ankle dorsiflexion 3 2+  Ankle plantarflexion 3+ 3  Ankle inversion    Ankle eversion    (Blank rows = not tested)  BED MOBILITY:  Not tested  TRANSFERS: Sit to stand: CGA  Assistive device utilized: None     Stand to sit: CGA  Assistive device utilized: None     Chair to chair: CGA  Assistive device utilized: None       RAMP:  Not tested  CURB:  Findings: needs UE support to step up  STAIRS: heavy BUE support step over step negotiation.  GAIT: Findings: Gait  Characteristics: decreased stride length, Left foot flat, wide BOS, and poor foot clearance- Left, Distance walked: 60 ft, Assistive device utilized:None, Level of assistance: CGA, and Comments: increased L ankle rolling due to not wearing AFO  FUNCTIONAL TESTS:  5 times sit to stand: 30.53 with 4 posterior LOB 6 minute walk test: perform next session  Berg Balance Scale:  26  PATIENT SURVEYS:  LEFS give next session                                                                                                                               TREATMENT DATE: 08/02/24  ***  TherAct: To improve functional movements patterns for everyday tasks  The patient completed 10 minutes at level(s) 3-9 on the NuStep using both BUE/BLE reciprocal movements to promote strength, endurance, and cardiorespiratory fitness. PT increased the resistance level and monitored the patient's response to the intervention throughout. The patient required min cueing for technique and supervisions level of assistance. Cues for LLE hip orientation to prevent Hip ER to challenge appropriate musculature  STS attempts with use of the green balloon/ball however pt with increased difficulty performing, pt took several attempts to perform the task, totally ~6 STS's in total with toss and catch to/from therapist  Vitals were assessed due to the pt noting some dizziness upon standing and noted below:  There were no vitals filed for this visit.        NMR: To facilitate reeducation of movement, balance, posture, coordination, and/or proprioception/kinesthetic sense.  Static standing bowling with use of the foam basketballs in attempt to knock over cones placed ~15' in front of pt.  Pt able to perform better today on the firm ground rather than the airex pad.       PATIENT EDUCATION: Education details: goals, POC, HEP Person educated: Patient Education method: Explanation, Demonstration, Tactile cues, Verbal cues, and  Handouts Education comprehension: verbalized understanding, returned demonstration, verbal cues required, tactile cues required, and needs further education  HOME EXERCISE PROGRAM: Access Code: [email or web address] URL: [email or web address] Date: 05/26/2024 Prepared by: Sidra Simpers  Exercises - Leg Extension  - 1 x daily - 7 x weekly - 2 sets - 10 reps - 5 hold - Seated March  - 1 x daily - 7 x weekly - 2 sets - 10 reps - 5 hold - Seated Heel Raise  - 1 x daily - 7 x weekly - 2 sets - 10 reps - 5 hold - Sit to Stand  - 1 x daily - 7 x weekly - 3 sets - 10 reps - Mini Squat with Counter Support  - 1 x daily - 7 x weekly - 3 sets - 10 reps - Standing 4-Way Leg Reach with Counter Support  - 1 x daily - 7 x weekly - 3 sets - 10 reps  GOALS: Goals reviewed with patient? Yes  SHORT TERM GOALS: Target date: 01/26/2024  Patient will be independent in home exercise program to improve strength/mobility for better functional independence with ADLs.  Baseline: Goal status: INITIAL  LONG TERM GOALS: Target date: 03/22/2024  Patient (< 64 years old) will complete five times sit to stand test in < 10 seconds indicating an increased LE strength and improved balance.  Baseline: 30.53 with 4 posterior LOB 03/29/24: 22.61 with 3 posterior LOB 06/21/24:  21.7 with 2 posterior LOB Goal status: PROGRESSING  2.   Patient will increase Berg Balance score by >45/56 to demonstrate decreased fall risk during functional activities.  Baseline: 5/5: 26  03/29/24: 32 Goal status: INITIAL  3.    Patient will increase six minute walk test distance to >1200 for progression to age norm community ambulator and improve gait ability  Baseline: perform next session 5/12: 710 ft  03/29/24: 770' 06/18/24: 792' Goal status: PROGRESSING  4.  Patient will score > 60/80 on LEFS for improved functional mobility and quality of life.  Baseline: perform next session 5/12: 43 03/29/24: 39/80 06/21/24: 38/80 Goal status:  INITIAL    ASSESSMENT:  CLINICAL IMPRESSION:   ***      OBJECTIVE IMPAIRMENTS: Abnormal gait, cardiopulmonary status limiting activity, decreased activity tolerance, decreased balance, decreased coordination, decreased endurance, decreased knowledge of use of DME, decreased mobility, difficulty walking, decreased ROM, decreased strength, dizziness, impaired perceived functional ability, impaired flexibility, impaired vision/preception, improper body mechanics, postural dysfunction, and obesity.   ACTIVITY LIMITATIONS: carrying, lifting, bending, sitting, standing, squatting, stairs, transfers, bed mobility, toileting, dressing, reach over head, hygiene/grooming, locomotion level, and caring for others  PARTICIPATION LIMITATIONS: meal prep, cleaning, laundry, driving, shopping, community activity, occupation, and yard work  PERSONAL FACTORS: Age, Behavior pattern, Fitness, Past/current experiences, Time since onset of injury/illness/exacerbation, Transportation, and 3+ comorbidities: DM, HTN, Anemia, Bells palsy, Hypothyroidism, Kidney stones, stroke and pseudotumour cerebri. Macular degeneration with impaired visual acuity  are also affecting patient's functional outcome.   REHAB POTENTIAL: Good  CLINICAL DECISION MAKING: Evolving/moderate complexity  EVALUATION COMPLEXITY: Moderate  PLAN:  PT FREQUENCY: 2x/week  PT DURATION: 12 weeks  PLANNED INTERVENTIONS: 97164- PT Re-evaluation, 97750- Physical Performance Testing, 97110-Therapeutic exercises, 97530- Therapeutic activity, [id number]- Neuromuscular re-education, 97535- Self Care, 02859- Manual therapy, [id number]- Gait training, [id number]- Orthotic Initial, [phone]- Orthotic/Prosthetic subsequent, [id number]- Canalith repositioning, [id number]- Splinting, H9716- Electrical stimulation (unattended), [phone]- Electrical stimulation (manual), [id number]- Vasopneumatic device, [id number]- Ultrasound, [id number]- Traction (mechanical), [id number]- Ionotophoresis 4mg /ml  Dexamethasone , Patient/Family education, Balance training, Stair training, Taping, Dry Needling, Joint mobilization, Spinal mobilization, Scar mobilization, Compression bandaging, Vestibular training, Visual/preceptual remediation/compensation, Cognitive remediation, DME instructions, Cryotherapy, Moist heat, and Biofeedback  PLAN FOR NEXT SESSION: *** Blaze Pods, Korebalance balance, strength, L ankle strength, stairs, LE endurance   Fonda Simpers, PT, DPT Physical Therapist - [place]  [facility]  08/02/24, 7:56 AM

## 2024-08-04 ENCOUNTER — Ambulatory Visit

## 2024-08-06 ENCOUNTER — Telehealth: Payer: Self-pay

## 2024-08-06 NOTE — Patient Instructions (Signed)
 Norleen Anes [name] - I am sorry I was unable to reach you today for our scheduled appointment. I work with Glendia Shad, MD and am calling to support your healthcare needs. Please contact me at [phone] at your earliest convenience. I look forward to speaking with you soon.   Thank you,  Thersia Hoar, BSW, MHA [place]  [facility] Social Worker, Population Health [phone]

## 2024-08-09 ENCOUNTER — Ambulatory Visit

## 2024-08-09 NOTE — Therapy (Incomplete)
 OUTPATIENT PHYSICAL THERAPY NEURO TREATMENT   Patient Name: Gregory Crane MRN: [id number] DOB:July 07, 1971, 53 y.o., male Today's Date: 08/09/2024  PCP: Glendia Shad  REFERRING PROVIDER: Glendia Shad  END OF SESSION:    Past Medical History:  Diagnosis Date   Allergy    Anemia    Bell's palsy    Diabetes mellitus without complication (HCC)    diet controlled   Fall 07/2023   Hypertension    Hypothyroidism    Kidney stones    Pseudotumor cerebri    Stroke [facility])    Past Surgical History:  Procedure Laterality Date   COLONOSCOPY WITH PROPOFOL  N/A 03/30/2020   Procedure: COLONOSCOPY WITH PROPOFOL ;  Surgeon: Maryruth Ole DASEN, MD;  Location: [facility] ENDOSCOPY;  Service: Endoscopy;  Laterality: N/A;   CORONARY STENT INTERVENTION N/A 03/17/2024   Procedure: CORONARY STENT INTERVENTION;  Surgeon: Jordan, Peter M, MD;  Location: [facility] INVASIVE CV LAB;  Service: Cardiovascular;  Laterality: N/A;   LEFT HEART CATH AND CORONARY ANGIOGRAPHY N/A 03/17/2024   Procedure: LEFT HEART CATH AND CORONARY ANGIOGRAPHY;  Surgeon: Jordan, Peter M, MD;  Location: [facility] INVASIVE CV LAB;  Service: Cardiovascular;  Laterality: N/A;   LOOP RECORDER INSERTION N/A 01/27/2018   Procedure: LOOP RECORDER INSERTION;  Surgeon: Fernande Elspeth BROCKS, MD;  Location: [facility] INVASIVE CV LAB;  Service: Cardiovascular;  Laterality: N/A;   LUMBAR PUNCTURE     as child   NO PAST SURGERIES     REMOVAL OF A DIALYSIS CATHETER     TEE WITHOUT CARDIOVERSION N/A 01/07/2018   Procedure: TRANSESOPHAGEAL ECHOCARDIOGRAM (TEE);  Surgeon: Perla Evalene PARAS, MD;  Location: [facility] ORS;  Service: Cardiovascular;  Laterality: N/A;   Patient Active Problem List   Diagnosis Date Noted   Bradycardia 04/30/2024   CAD S/P percutaneous coronary angioplasty 03/17/2024   Non-ST elevation (NSTEMI) myocardial infarction (HCC) 03/16/2024   Hypervolemia 03/14/2024   Hypoxic respiratory failure (HCC) 03/14/2024   Left hip pain 01/30/2024   SVT  (supraventricular tachycardia) 08/10/2023   Atrial fibrillation (HCC) 07/03/2023   Atrial fibrillation with RVR (HCC) 07/02/2023   Fall 06/15/2023   Non-compliance with renal dialysis 05/02/2023   Volume overload 04/29/2023   Renal osteodystrophy 11/25/2022   Unsteady gait 10/05/2022   Steal syndrome of dialysis vascular access 05/28/2022   Hydronephrosis, left 05/04/2022   Leukocytosis 05/04/2022   Diabetic retinopathy associated with diabetes mellitus due to underlying condition (HCC) 01/13/2022   ESRD on hemodialysis (HCC) 01/13/2022   Deafness in right ear 08/11/2021   History of colon polyps 10/15/2020   Type 2 diabetes mellitus, with long-term current use of insulin  (HCC) 09/09/2020   Cryptogenic stroke (HCC) 07/13/2020   History of loop recorder 07/13/2020   Lymphedema 06/08/2019   Anemia 04/17/2019   Swelling of both lower extremities 01/10/2019   Facial droop 04/09/2018   Daytime somnolence 03/30/2018   Carotid artery disease 02/03/2018   Intracranial vascular stenosis 09/12/2017   History of CVA (cerebrovascular accident) 11/10/2016   Benign localized hyperplasia of prostate with urinary obstruction 10/27/2016   History of nephrolithiasis 10/27/2016   TIA (transient ischemic attack) 10/20/2016   Near syncope 06/23/2016   Organic impotence 10/01/2015   Neuropathy 08/06/2015   Health care maintenance 08/06/2015   Hypertension 08/06/2015   Hypothyroidism 10/10/2013   Microalbuminuria 10/10/2013   Hyperlipidemia 10/10/2013   B12 deficiency 10/10/2013   Environmental allergies 07/04/2013    ONSET DATE: 3 years ago  REFERRING DIAG: unsteady gait  THERAPY DIAG:  No diagnosis found.  Rationale for Evaluation and Treatment: Rehabilitation  SUBJECTIVE:                                                                                                                                                                                             SUBJECTIVE STATEMENT:    ***  Pt accompanied by: self  PERTINENT HISTORY: Patient returning to PT s/p hospitalization for pneumonia, , SVT, ESRD on hypodialysis. PMH DM, HTN, Anemia, Bells palsy, Hypothyroidism, Kidney stones, stroke and pseudotumour cerebri. Macular degeneration with impaired visual acuity   PAIN:  Are you having pain? Some L foot numbness/neuropathy  PRECAUTIONS: Fall  RED FLAGS: None   WEIGHT BEARING RESTRICTIONS: No  FALLS: Has patient fallen in last 6 months? No  LIVING ENVIRONMENT: Lives with: lives alone Lives in: House/apartment Stairs: yes stairs in house Has following equipment at home: Single point cane  PLOF: Independent with basic ADLs  PATIENT GOALS: balance, strength  OBJECTIVE:  Note: Objective measures were completed at Evaluation unless otherwise noted.  DIAGNOSTIC FINDINGS: IMPRESSION: Interval resolution of previously noted trace left parafalcine subdural hematoma. No acute intracranial process.  COGNITION: Overall cognitive status: Within functional limits for tasks assessed   SENSATION: WFL  COORDINATION: Heel slide: WFL RLE; slight decrease with LLE   MUSCLE TONE: WFL  POSTURE: rounded shoulders, forward head, and posterior pelvic tilt  LOWER EXTREMITY MMT:    MMT Right Eval Left Eval  Hip flexion 4 4  Hip extension    Hip abduction 3 3  Hip adduction 3+ 3+  Hip internal rotation    Hip external rotation    Knee flexion 4- 4-  Knee extension 4 4  Ankle dorsiflexion 3 2+  Ankle plantarflexion 3+ 3  Ankle inversion    Ankle eversion    (Blank rows = not tested)  BED MOBILITY:  Not tested  TRANSFERS: Sit to stand: CGA  Assistive device utilized: None     Stand to sit: CGA  Assistive device utilized: None     Chair to chair: CGA  Assistive device utilized: None       RAMP:  Not tested  CURB:  Findings: needs UE support to step up  STAIRS: heavy BUE support step over step negotiation.  GAIT: Findings: Gait  Characteristics: decreased stride length, Left foot flat, wide BOS, and poor foot clearance- Left, Distance walked: 60 ft, Assistive device utilized:None, Level of assistance: CGA, and Comments: increased L ankle rolling due to not wearing AFO  FUNCTIONAL TESTS:  5 times sit to stand: 30.53 with 4 posterior LOB 6 minute walk test: perform next session  Berg Balance Scale:  26  PATIENT SURVEYS:  LEFS give next session                                                                                                                               TREATMENT DATE: 08/09/2024  TherAct: To improve functional movements patterns for everyday tasks  The patient completed 10 minutes at level(s) 3-9 on the NuStep using both BUE/BLE reciprocal movements to promote strength, endurance, and cardiorespiratory fitness. PT increased the resistance level and monitored the patient's response to the intervention throughout. The patient required min cueing for technique and supervisions level of assistance. Cues for LLE hip orientation to prevent Hip ER to challenge appropriate musculature  STS attempts with use of the green balloon/ball however pt with increased difficulty performing, pt took several attempts to perform the task, totally ~6 STS's in total with toss and catch to/from therapist  Vitals were assessed due to the pt noting some dizziness upon standing and noted below:  There were no vitals filed for this visit.        NMR: To facilitate reeducation of movement, balance, posture, coordination, and/or proprioception/kinesthetic sense.  Static standing bowling with use of the foam basketballs in attempt to knock over cones placed ~15' in front of pt.  Pt able to perform better today on the firm ground rather than the airex pad.       PATIENT EDUCATION: Education details: goals, POC, HEP Person educated: Patient Education method: Explanation, Demonstration, Tactile cues, Verbal cues, and  Handouts Education comprehension: verbalized understanding, returned demonstration, verbal cues required, tactile cues required, and needs further education  HOME EXERCISE PROGRAM: Access Code: [email or web address] URL: [email or web address] Date: 05/26/2024 Prepared by: Sidra Simpers  Exercises - Leg Extension  - 1 x daily - 7 x weekly - 2 sets - 10 reps - 5 hold - Seated March  - 1 x daily - 7 x weekly - 2 sets - 10 reps - 5 hold - Seated Heel Raise  - 1 x daily - 7 x weekly - 2 sets - 10 reps - 5 hold - Sit to Stand  - 1 x daily - 7 x weekly - 3 sets - 10 reps - Mini Squat with Counter Support  - 1 x daily - 7 x weekly - 3 sets - 10 reps - Standing 4-Way Leg Reach with Counter Support  - 1 x daily - 7 x weekly - 3 sets - 10 reps  GOALS: Goals reviewed with patient? Yes  SHORT TERM GOALS: Target date: 01/26/2024  Patient will be independent in home exercise program to improve strength/mobility for better functional independence with ADLs.  Baseline: Goal status: INITIAL  LONG TERM GOALS: Target date: 03/22/2024  Patient (< 71 years old) will complete five times sit to stand test in < 10 seconds indicating an increased LE strength and improved balance.  Baseline: 30.53 with 4 posterior LOB 03/29/24: 22.61 with 3 posterior LOB 06/21/24: 21.7 with  2 posterior LOB Goal status: PROGRESSING  2.   Patient will increase Berg Balance score by >45/56 to demonstrate decreased fall risk during functional activities.  Baseline: 5/5: 26  03/29/24: 32 Goal status: INITIAL  3.    Patient will increase six minute walk test distance to >1200 for progression to age norm community ambulator and improve gait ability  Baseline: perform next session 5/12: 710 ft  03/29/24: 770' 06/18/24: 792' Goal status: PROGRESSING  4.  Patient will score > 60/80 on LEFS for improved functional mobility and quality of life.  Baseline: perform next session 5/12: 43 03/29/24: 39/80 06/21/24: 38/80 Goal status:  INITIAL    ASSESSMENT:  CLINICAL IMPRESSION:   ***  Pt will continue to benefit from skilled therapy to address remaining deficits in order to improve overall QoL and return to PLOF.         OBJECTIVE IMPAIRMENTS: Abnormal gait, cardiopulmonary status limiting activity, decreased activity tolerance, decreased balance, decreased coordination, decreased endurance, decreased knowledge of use of DME, decreased mobility, difficulty walking, decreased ROM, decreased strength, dizziness, impaired perceived functional ability, impaired flexibility, impaired vision/preception, improper body mechanics, postural dysfunction, and obesity.   ACTIVITY LIMITATIONS: carrying, lifting, bending, sitting, standing, squatting, stairs, transfers, bed mobility, toileting, dressing, reach over head, hygiene/grooming, locomotion level, and caring for others  PARTICIPATION LIMITATIONS: meal prep, cleaning, laundry, driving, shopping, community activity, occupation, and yard work  PERSONAL FACTORS: Age, Behavior pattern, Fitness, Past/current experiences, Time since onset of injury/illness/exacerbation, Transportation, and 3+ comorbidities: DM, HTN, Anemia, Bells palsy, Hypothyroidism, Kidney stones, stroke and pseudotumour cerebri. Macular degeneration with impaired visual acuity  are also affecting patient's functional outcome.   REHAB POTENTIAL: Good  CLINICAL DECISION MAKING: Evolving/moderate complexity  EVALUATION COMPLEXITY: Moderate  PLAN:  PT FREQUENCY: 2x/week  PT DURATION: 12 weeks  PLANNED INTERVENTIONS: 97164- PT Re-evaluation, 97750- Physical Performance Testing, 97110-Therapeutic exercises, 97530- Therapeutic activity, [id number]- Neuromuscular re-education, 97535- Self Care, [id number]- Manual therapy, [id number]- Gait training, [phone]- Orthotic Initial, [phone]- Orthotic/Prosthetic subsequent, [phone]- Canalith repositioning, [id number]- Splinting, H9716- Electrical stimulation (unattended), [phone]- Electrical  stimulation (manual), [id number]- Vasopneumatic device, [id number]- Ultrasound, [id number]- Traction (mechanical), [id number]- Ionotophoresis 4mg /ml Dexamethasone , Patient/Family education, Balance training, Stair training, Taping, Dry Needling, Joint mobilization, Spinal mobilization, Scar mobilization, Compression bandaging, Vestibular training, Visual/preceptual remediation/compensation, Cognitive remediation, DME instructions, Cryotherapy, Moist heat, and Biofeedback  PLAN FOR NEXT SESSION:  Blaze Pods, Korebalance balance, strength, L ankle strength, stairs, LE endurance   Akiem Urieta  Leopoldo KLEIN ,DPT Physical Therapist-   [facility]  08/09/2024, 7:48 AM

## 2024-08-10 NOTE — Therapy (Signed)
 OUTPATIENT PHYSICAL THERAPY NEURO TREATMENT   Patient Name: Lisle Skillman MRN: [id number] DOB:Nov 24, 1970, 53 y.o., male Today's Date: 08/11/2024  PCP: Glendia Shad  REFERRING PROVIDER: Glendia Shad  END OF SESSION:  PT End of Session - 08/11/24 1132     Visit Number 38    Number of Visits 48    Date for Recertification  08/16/24    Progress Note Due on Visit 20    PT Start Time 1138    PT Stop Time 1223    PT Time Calculation (min) 45 min    Equipment Utilized During Treatment Gait belt    Activity Tolerance Patient tolerated treatment well    Behavior During Therapy [facility] for tasks assessed/performed           Past Medical History:  Diagnosis Date   Allergy    Anemia    Bell's palsy    Diabetes mellitus without complication (HCC)    diet controlled   Fall 07/2023   Hypertension    Hypothyroidism    Kidney stones    Pseudotumor cerebri    Stroke [facility])    Past Surgical History:  Procedure Laterality Date   COLONOSCOPY WITH PROPOFOL  N/A 03/30/2020   Procedure: COLONOSCOPY WITH PROPOFOL ;  Surgeon: Maryruth Ole DASEN, MD;  Location: [facility] ENDOSCOPY;  Service: Endoscopy;  Laterality: N/A;   CORONARY STENT INTERVENTION N/A 03/17/2024   Procedure: CORONARY STENT INTERVENTION;  Surgeon: Jordan, Peter M, MD;  Location: [facility] INVASIVE CV LAB;  Service: Cardiovascular;  Laterality: N/A;   LEFT HEART CATH AND CORONARY ANGIOGRAPHY N/A 03/17/2024   Procedure: LEFT HEART CATH AND CORONARY ANGIOGRAPHY;  Surgeon: Jordan, Peter M, MD;  Location: [facility] INVASIVE CV LAB;  Service: Cardiovascular;  Laterality: N/A;   LOOP RECORDER INSERTION N/A 01/27/2018   Procedure: LOOP RECORDER INSERTION;  Surgeon: Fernande Elspeth BROCKS, MD;  Location: [facility] INVASIVE CV LAB;  Service: Cardiovascular;  Laterality: N/A;   LUMBAR PUNCTURE     as child   NO PAST SURGERIES     REMOVAL OF A DIALYSIS CATHETER     TEE WITHOUT CARDIOVERSION N/A 01/07/2018   Procedure: TRANSESOPHAGEAL ECHOCARDIOGRAM (TEE);   Surgeon: Perla Evalene PARAS, MD;  Location: [facility] ORS;  Service: Cardiovascular;  Laterality: N/A;   Patient Active Problem List   Diagnosis Date Noted   Bradycardia 04/30/2024   CAD S/P percutaneous coronary angioplasty 03/17/2024   Non-ST elevation (NSTEMI) myocardial infarction (HCC) 03/16/2024   Hypervolemia 03/14/2024   Hypoxic respiratory failure (HCC) 03/14/2024   Left hip pain 01/30/2024   SVT (supraventricular tachycardia) 08/10/2023   Atrial fibrillation (HCC) 07/03/2023   Atrial fibrillation with RVR (HCC) 07/02/2023   Fall 06/15/2023   Non-compliance with renal dialysis 05/02/2023   Volume overload 04/29/2023   Renal osteodystrophy 11/25/2022   Unsteady gait 10/05/2022   Steal syndrome of dialysis vascular access 05/28/2022   Hydronephrosis, left 05/04/2022   Leukocytosis 05/04/2022   Diabetic retinopathy associated with diabetes mellitus due to underlying condition (HCC) 01/13/2022   ESRD on hemodialysis (HCC) 01/13/2022   Deafness in right ear 08/11/2021   History of colon polyps 10/15/2020   Type 2 diabetes mellitus, with long-term current use of insulin  (HCC) 09/09/2020   Cryptogenic stroke (HCC) 07/13/2020   History of loop recorder 07/13/2020   Lymphedema 06/08/2019   Anemia 04/17/2019   Swelling of both lower extremities 01/10/2019   Facial droop 04/09/2018   Daytime somnolence 03/30/2018   Carotid artery disease 02/03/2018   Intracranial vascular stenosis 09/12/2017  History of CVA (cerebrovascular accident) 11/10/2016   Benign localized hyperplasia of prostate with urinary obstruction 10/27/2016   History of nephrolithiasis 10/27/2016   TIA (transient ischemic attack) 10/20/2016   Near syncope 06/23/2016   Organic impotence 10/01/2015   Neuropathy 08/06/2015   Health care maintenance 08/06/2015   Hypertension 08/06/2015   Hypothyroidism 10/10/2013   Microalbuminuria 10/10/2013   Hyperlipidemia 10/10/2013   B12 deficiency 10/10/2013   Environmental  allergies 07/04/2013    ONSET DATE: 3 years ago  REFERRING DIAG: unsteady gait  THERAPY DIAG:  Muscle weakness (generalized)  Unsteadiness on feet  Other abnormalities of gait and mobility  Difficulty in walking, not elsewhere classified  Rationale for Evaluation and Treatment: Rehabilitation  SUBJECTIVE:                                                                                                                                                                                             SUBJECTIVE STATEMENT:   Patient reports he is recovering from a cough, missed last session due to ride issues. No falls since last session.  Pt accompanied by: self  PERTINENT HISTORY: Patient returning to PT s/p hospitalization for pneumonia, , SVT, ESRD on hypodialysis. PMH DM, HTN, Anemia, Bells palsy, Hypothyroidism, Kidney stones, stroke and pseudotumour cerebri. Macular degeneration with impaired visual acuity   PAIN:  Are you having pain? Some L foot numbness/neuropathy  PRECAUTIONS: Fall  RED FLAGS: None   WEIGHT BEARING RESTRICTIONS: No  FALLS: Has patient fallen in last 6 months? No  LIVING ENVIRONMENT: Lives with: lives alone Lives in: House/apartment Stairs: yes stairs in house Has following equipment at home: Single point cane  PLOF: Independent with basic ADLs  PATIENT GOALS: balance, strength  OBJECTIVE:  Note: Objective measures were completed at Evaluation unless otherwise noted.  DIAGNOSTIC FINDINGS: IMPRESSION: Interval resolution of previously noted trace left parafalcine subdural hematoma. No acute intracranial process.  COGNITION: Overall cognitive status: Within functional limits for tasks assessed   SENSATION: WFL  COORDINATION: Heel slide: WFL RLE; slight decrease with LLE   MUSCLE TONE: WFL  POSTURE: rounded shoulders, forward head, and posterior pelvic tilt  LOWER EXTREMITY MMT:    MMT Right Eval Left Eval  Hip flexion 4 4  Hip  extension    Hip abduction 3 3  Hip adduction 3+ 3+  Hip internal rotation    Hip external rotation    Knee flexion 4- 4-  Knee extension 4 4  Ankle dorsiflexion 3 2+  Ankle plantarflexion 3+ 3  Ankle inversion    Ankle eversion    (Blank rows = not tested)  BED MOBILITY:  Not tested  TRANSFERS: Sit to stand: CGA  Assistive device utilized: None     Stand to sit: CGA  Assistive device utilized: None     Chair to chair: CGA  Assistive device utilized: None       RAMP:  Not tested  CURB:  Findings: needs UE support to step up  STAIRS: heavy BUE support step over step negotiation.  GAIT: Findings: Gait Characteristics: decreased stride length, Left foot flat, wide BOS, and poor foot clearance- Left, Distance walked: 60 ft, Assistive device utilized:None, Level of assistance: CGA, and Comments: increased L ankle rolling due to not wearing AFO  FUNCTIONAL TESTS:  5 times sit to stand: 30.53 with 4 posterior LOB 6 minute walk test: perform next session  Berg Balance Scale: 26  PATIENT SURVEYS:  LEFS give next session                                                                                                                               TREATMENT DATE: 08/11/2024  TherAct: To improve functional movements patterns for everyday tasks  The patient completed 8 minutes at level(s) 3-9 intervals on the NuStep using both BUE/BLE reciprocal movements to promote strength, endurance, and cardiorespiratory fitness. PT increased the resistance level and monitored the patient's response to the intervention throughout. The patient required min cueing for technique and supervisions level of assistance. Cues for LLE hip orientation to prevent Hip ER to challenge appropriate musculature  STS 15x with one rest break; seat supported by table ; frequent posterior LOB and posterior stepping.      There were no vitals filed for this visit.     NMR: To facilitate reeducation of  movement, balance, posture, coordination, and/or proprioception/kinesthetic sense.  Incline standing at support surface 30 seconds x 2 trials  Airex pad tandem stance with 6 step 30 second holds x 2 trials each LE  TE- To improve strength, endurance, mobility, and function of specific targeted muscle groups or improve joint range of motion or improve muscle flexibility    Adduction balls squeeze 15x 3 second holds  Adduction squeeze with IR/ER 15x each LE  Dynadisc: heel toe rocking cue for neutral foot alignment 20x each LE ; 2 set each LE      PATIENT EDUCATION: Education details: goals, POC, HEP Person educated: Patient Education method: Explanation, Demonstration, Tactile cues, Verbal cues, and Handouts Education comprehension: verbalized understanding, returned demonstration, verbal cues required, tactile cues required, and needs further education  HOME EXERCISE PROGRAM: Access Code: [email or web address] URL: [email or web address] Date: 05/26/2024 Prepared by: Sidra Simpers  Exercises - Leg Extension  - 1 x daily - 7 x weekly - 2 sets - 10 reps - 5 hold - Seated March  - 1 x daily - 7 x weekly - 2 sets - 10 reps - 5 hold - Seated Heel Raise  - 1 x daily - 7 x weekly - 2 sets - 10 reps -  5 hold - Sit to Stand  - 1 x daily - 7 x weekly - 3 sets - 10 reps - Mini Squat with Counter Support  - 1 x daily - 7 x weekly - 3 sets - 10 reps - Standing 4-Way Leg Reach with Counter Support  - 1 x daily - 7 x weekly - 3 sets - 10 reps  GOALS: Goals reviewed with patient? Yes  SHORT TERM GOALS: Target date: 01/26/2024  Patient will be independent in home exercise program to improve strength/mobility for better functional independence with ADLs.  Baseline: Goal status: INITIAL  LONG TERM GOALS: Target date: 03/22/2024  Patient (< 29 years old) will complete five times sit to stand test in < 10 seconds indicating an increased LE strength and improved balance.  Baseline: 30.53 with 4  posterior LOB 03/29/24: 22.61 with 3 posterior LOB 06/21/24: 21.7 with 2 posterior LOB Goal status: PROGRESSING  2.   Patient will increase Berg Balance score by >45/56 to demonstrate decreased fall risk during functional activities.  Baseline: 5/5: 26  03/29/24: 32 Goal status: INITIAL  3.    Patient will increase six minute walk test distance to >1200 for progression to age norm community ambulator and improve gait ability  Baseline: perform next session 5/12: 710 ft  03/29/24: 770' 06/18/24: 792' Goal status: PROGRESSING  4.  Patient will score > 60/80 on LEFS for improved functional mobility and quality of life.  Baseline: perform next session 5/12: 43 03/29/24: 39/80 06/21/24: 38/80 Goal status: INITIAL    ASSESSMENT:  CLINICAL IMPRESSION:     Patient is very challenged with sit to stands with frequent posterior LOB and compensatory posterior stepping mechanisms.  He is highly motivated despite congestion.Patient is challenged with stance on LLE with frequent LOB requiring re-stabilization.  Patient is aware next session will address goals. Pt will continue to benefit from skilled therapy to address remaining deficits in order to improve overall QoL and return to PLOF.         OBJECTIVE IMPAIRMENTS: Abnormal gait, cardiopulmonary status limiting activity, decreased activity tolerance, decreased balance, decreased coordination, decreased endurance, decreased knowledge of use of DME, decreased mobility, difficulty walking, decreased ROM, decreased strength, dizziness, impaired perceived functional ability, impaired flexibility, impaired vision/preception, improper body mechanics, postural dysfunction, and obesity.   ACTIVITY LIMITATIONS: carrying, lifting, bending, sitting, standing, squatting, stairs, transfers, bed mobility, toileting, dressing, reach over head, hygiene/grooming, locomotion level, and caring for others  PARTICIPATION LIMITATIONS: meal prep, cleaning, laundry,  driving, shopping, community activity, occupation, and yard work  PERSONAL FACTORS: Age, Behavior pattern, Fitness, Past/current experiences, Time since onset of injury/illness/exacerbation, Transportation, and 3+ comorbidities: DM, HTN, Anemia, Bells palsy, Hypothyroidism, Kidney stones, stroke and pseudotumour cerebri. Macular degeneration with impaired visual acuity  are also affecting patient's functional outcome.   REHAB POTENTIAL: Good  CLINICAL DECISION MAKING: Evolving/moderate complexity  EVALUATION COMPLEXITY: Moderate  PLAN:  PT FREQUENCY: 2x/week  PT DURATION: 12 weeks  PLANNED INTERVENTIONS: 97164- PT Re-evaluation, 97750- Physical Performance Testing, 97110-Therapeutic exercises, 97530- Therapeutic activity, [id number]- Neuromuscular re-education, 97535- Self Care, [id number]- Manual therapy, [id number]- Gait training, [id number]- Orthotic Initial, [id number]- Orthotic/Prosthetic subsequent, [phone]- Canalith repositioning, [id number]- Splinting, H9716- Electrical stimulation (unattended), [id number]- Electrical stimulation (manual), [id number]- Vasopneumatic device, [id number]- Ultrasound, [id number]- Traction (mechanical), [id number]- Ionotophoresis 4mg /ml Dexamethasone , Patient/Family education, Balance training, Stair training, Taping, Dry Needling, Joint mobilization, Spinal mobilization, Scar mobilization, Compression bandaging, Vestibular training, Visual/preceptual remediation/compensation, Cognitive remediation, DME instructions, Cryotherapy, Moist heat, and Biofeedback  PLAN FOR NEXT SESSION:  Blaze  Pods, Korebalance balance, strength, L ankle strength, stairs, LE endurance   Cipriana Biller  Leopoldo KLEIN ,DPT Physical Therapist- [place]  [facility]  08/11/2024, 1:04 PM

## 2024-08-11 ENCOUNTER — Other Ambulatory Visit

## 2024-08-11 ENCOUNTER — Ambulatory Visit

## 2024-08-11 DIAGNOSIS — R2689 Other abnormalities of gait and mobility: Secondary | ICD-10-CM

## 2024-08-11 DIAGNOSIS — R262 Difficulty in walking, not elsewhere classified: Secondary | ICD-10-CM

## 2024-08-11 DIAGNOSIS — M6281 Muscle weakness (generalized): Secondary | ICD-10-CM | POA: Diagnosis not present

## 2024-08-11 DIAGNOSIS — R2681 Unsteadiness on feet: Secondary | ICD-10-CM

## 2024-08-12 NOTE — Therapy (Incomplete)
 OUTPATIENT PHYSICAL THERAPY NEURO TREATMENT/RECERT   Patient Name: Gregory Crane MRN: [id number] DOB:Apr 18, 1971, 53 y.o., male Today's Date: 08/12/2024  PCP: Glendia Shad  REFERRING PROVIDER: Glendia Shad  END OF SESSION:     Past Medical History:  Diagnosis Date   Allergy    Anemia    Bell's palsy    Diabetes mellitus without complication (HCC)    diet controlled   Fall 07/2023   Hypertension    Hypothyroidism    Kidney stones    Pseudotumor cerebri    Stroke [facility])    Past Surgical History:  Procedure Laterality Date   COLONOSCOPY WITH PROPOFOL  N/A 03/30/2020   Procedure: COLONOSCOPY WITH PROPOFOL ;  Surgeon: Maryruth Ole DASEN, MD;  Location: [facility] ENDOSCOPY;  Service: Endoscopy;  Laterality: N/A;   CORONARY STENT INTERVENTION N/A 03/17/2024   Procedure: CORONARY STENT INTERVENTION;  Surgeon: Jordan, Peter M, MD;  Location: [facility] INVASIVE CV LAB;  Service: Cardiovascular;  Laterality: N/A;   LEFT HEART CATH AND CORONARY ANGIOGRAPHY N/A 03/17/2024   Procedure: LEFT HEART CATH AND CORONARY ANGIOGRAPHY;  Surgeon: Jordan, Peter M, MD;  Location: [facility] INVASIVE CV LAB;  Service: Cardiovascular;  Laterality: N/A;   LOOP RECORDER INSERTION N/A 01/27/2018   Procedure: LOOP RECORDER INSERTION;  Surgeon: Fernande Elspeth BROCKS, MD;  Location: [facility] INVASIVE CV LAB;  Service: Cardiovascular;  Laterality: N/A;   LUMBAR PUNCTURE     as child   NO PAST SURGERIES     REMOVAL OF A DIALYSIS CATHETER     TEE WITHOUT CARDIOVERSION N/A 01/07/2018   Procedure: TRANSESOPHAGEAL ECHOCARDIOGRAM (TEE);  Surgeon: Perla Evalene PARAS, MD;  Location: [facility] ORS;  Service: Cardiovascular;  Laterality: N/A;   Patient Active Problem List   Diagnosis Date Noted   Bradycardia 04/30/2024   CAD S/P percutaneous coronary angioplasty 03/17/2024   Non-ST elevation (NSTEMI) myocardial infarction (HCC) 03/16/2024   Hypervolemia 03/14/2024   Hypoxic respiratory failure (HCC) 03/14/2024   Left hip pain 01/30/2024    SVT (supraventricular tachycardia) 08/10/2023   Atrial fibrillation (HCC) 07/03/2023   Atrial fibrillation with RVR (HCC) 07/02/2023   Fall 06/15/2023   Non-compliance with renal dialysis 05/02/2023   Volume overload 04/29/2023   Renal osteodystrophy 11/25/2022   Unsteady gait 10/05/2022   Steal syndrome of dialysis vascular access 05/28/2022   Hydronephrosis, left 05/04/2022   Leukocytosis 05/04/2022   Diabetic retinopathy associated with diabetes mellitus due to underlying condition (HCC) 01/13/2022   ESRD on hemodialysis (HCC) 01/13/2022   Deafness in right ear 08/11/2021   History of colon polyps 10/15/2020   Type 2 diabetes mellitus, with long-term current use of insulin  (HCC) 09/09/2020   Cryptogenic stroke (HCC) 07/13/2020   History of loop recorder 07/13/2020   Lymphedema 06/08/2019   Anemia 04/17/2019   Swelling of both lower extremities 01/10/2019   Facial droop 04/09/2018   Daytime somnolence 03/30/2018   Carotid artery disease 02/03/2018   Intracranial vascular stenosis 09/12/2017   History of CVA (cerebrovascular accident) 11/10/2016   Benign localized hyperplasia of prostate with urinary obstruction 10/27/2016   History of nephrolithiasis 10/27/2016   TIA (transient ischemic attack) 10/20/2016   Near syncope 06/23/2016   Organic impotence 10/01/2015   Neuropathy 08/06/2015   Health care maintenance 08/06/2015   Hypertension 08/06/2015   Hypothyroidism 10/10/2013   Microalbuminuria 10/10/2013   Hyperlipidemia 10/10/2013   B12 deficiency 10/10/2013   Environmental allergies 07/04/2013    ONSET DATE: 3 years ago  REFERRING DIAG: unsteady gait  THERAPY DIAG:  No diagnosis found.  Rationale for Evaluation and Treatment: Rehabilitation  SUBJECTIVE:                                                                                                                                                                                             SUBJECTIVE STATEMENT:    ***  Pt accompanied by: self  PERTINENT HISTORY: Patient returning to PT s/p hospitalization for pneumonia, , SVT, ESRD on hypodialysis. PMH DM, HTN, Anemia, Bells palsy, Hypothyroidism, Kidney stones, stroke and pseudotumour cerebri. Macular degeneration with impaired visual acuity   PAIN:  Are you having pain? Some L foot numbness/neuropathy  PRECAUTIONS: Fall  RED FLAGS: None   WEIGHT BEARING RESTRICTIONS: No  FALLS: Has patient fallen in last 6 months? No  LIVING ENVIRONMENT: Lives with: lives alone Lives in: House/apartment Stairs: yes stairs in house Has following equipment at home: Single point cane  PLOF: Independent with basic ADLs  PATIENT GOALS: balance, strength  OBJECTIVE:  Note: Objective measures were completed at Evaluation unless otherwise noted.  DIAGNOSTIC FINDINGS: IMPRESSION: Interval resolution of previously noted trace left parafalcine subdural hematoma. No acute intracranial process.  COGNITION: Overall cognitive status: Within functional limits for tasks assessed   SENSATION: WFL  COORDINATION: Heel slide: WFL RLE; slight decrease with LLE   MUSCLE TONE: WFL  POSTURE: rounded shoulders, forward head, and posterior pelvic tilt  LOWER EXTREMITY MMT:    MMT Right Eval Left Eval  Hip flexion 4 4  Hip extension    Hip abduction 3 3  Hip adduction 3+ 3+  Hip internal rotation    Hip external rotation    Knee flexion 4- 4-  Knee extension 4 4  Ankle dorsiflexion 3 2+  Ankle plantarflexion 3+ 3  Ankle inversion    Ankle eversion    (Blank rows = not tested)  BED MOBILITY:  Not tested  TRANSFERS: Sit to stand: CGA  Assistive device utilized: None     Stand to sit: CGA  Assistive device utilized: None     Chair to chair: CGA  Assistive device utilized: None       RAMP:  Not tested  CURB:  Findings: needs UE support to step up  STAIRS: heavy BUE support step over step negotiation.  GAIT: Findings: Gait  Characteristics: decreased stride length, Left foot flat, wide BOS, and poor foot clearance- Left, Distance walked: 60 ft, Assistive device utilized:None, Level of assistance: CGA, and Comments: increased L ankle rolling due to not wearing AFO  FUNCTIONAL TESTS:  5 times sit to stand: 30.53 with 4 posterior LOB 6 minute walk test: perform next session  Berg Balance Scale:  26  PATIENT SURVEYS:  LEFS give next session                                                                                                                               TREATMENT DATE: 08/12/2024 Physical therapy treatment session today consisted of completing assessment of goals and administration of testing as demonstrated and documented in flow sheet, treatment, and goals section of this note. Addition treatments may be found below.   Pt performed 5 time sit<>stand (5xSTS): *** sec (>15 sec indicates increased fall risk)  BERG: 6 Min Walk Test:  Instructed patient to ambulate as quickly and as safely as possible for 6 minutes using LRAD. Patient was allowed to take standing rest breaks without stopping the test, but if the patient required a sitting rest break the clock would be stopped and the test would be over.  Results: *** feet (*** meters, Avg speed ***m/s) using a *** with ***. Results indicate that the patient has reduced endurance with ambulation compared to age matched norms.  Age Matched Norms: 7-69 yo M: 63 F: 63, 66-79 yo M: 60 F: 471, 76-89 yo M: 417 F: 392 MDC: 58.21 meters (190.98 feet) or 50 meters (ANPTA Core Set of Outcome Measures for Adults with Neurologic Conditions, 2018)    TherAct: To improve functional movements patterns for everyday tasks  The patient completed 8 minutes at level(s) 3-9 intervals on the NuStep using both BUE/BLE reciprocal movements to promote strength, endurance, and cardiorespiratory fitness. PT increased the resistance level and monitored the patient's response to the  intervention throughout. The patient required min cueing for technique and supervisions level of assistance. Cues for LLE hip orientation to prevent Hip ER to challenge appropriate musculature  STS 15x with one rest break; seat supported by table ; frequent posterior LOB and posterior stepping.      There were no vitals filed for this visit.     NMR: To facilitate reeducation of movement, balance, posture, coordination, and/or proprioception/kinesthetic sense.  Incline standing at support surface 30 seconds x 2 trials  Airex pad tandem stance with 6 step 30 second holds x 2 trials each LE  TE- To improve strength, endurance, mobility, and function of specific targeted muscle groups or improve joint range of motion or improve muscle flexibility    Adduction balls squeeze 15x 3 second holds  Adduction squeeze with IR/ER 15x each LE  Dynadisc: heel toe rocking cue for neutral foot alignment 20x each LE ; 2 set each LE      PATIENT EDUCATION: Education details: goals, POC, HEP Person educated: Patient Education method: Explanation, Demonstration, Tactile cues, Verbal cues, and Handouts Education comprehension: verbalized understanding, returned demonstration, verbal cues required, tactile cues required, and needs further education  HOME EXERCISE PROGRAM: Access Code: [email or web address] URL: [email or web address] Date: 05/26/2024 Prepared by: Sidra Simpers  Exercises - Leg Extension  - 1 x daily - 7 x weekly - 2 sets -  10 reps - 5 hold - Seated March  - 1 x daily - 7 x weekly - 2 sets - 10 reps - 5 hold - Seated Heel Raise  - 1 x daily - 7 x weekly - 2 sets - 10 reps - 5 hold - Sit to Stand  - 1 x daily - 7 x weekly - 3 sets - 10 reps - Mini Squat with Counter Support  - 1 x daily - 7 x weekly - 3 sets - 10 reps - Standing 4-Way Leg Reach with Counter Support  - 1 x daily - 7 x weekly - 3 sets - 10 reps  GOALS: Goals reviewed with patient? Yes  SHORT TERM GOALS: Target  date: 01/26/2024  Patient will be independent in home exercise program to improve strength/mobility for better functional independence with ADLs.  Baseline: Goal status: INITIAL  LONG TERM GOALS: Target date: 03/22/2024  Patient (< 44 years old) will complete five times sit to stand test in < 10 seconds indicating an increased LE strength and improved balance.  Baseline: 30.53 with 4 posterior LOB 03/29/24: 22.61 with 3 posterior LOB 06/21/24: 21.7 with 2 posterior LOB Goal status: PROGRESSING  2.   Patient will increase Berg Balance score by >45/56 to demonstrate decreased fall risk during functional activities.  Baseline: 5/5: 26  03/29/24: 32 Goal status: INITIAL  3.    Patient will increase six minute walk test distance to >1200 for progression to age norm community ambulator and improve gait ability  Baseline: perform next session 5/12: 710 ft  03/29/24: 770' 06/18/24: 792' Goal status: PROGRESSING  4.  Patient will score > 60/80 on LEFS for improved functional mobility and quality of life.  Baseline: perform next session 5/12: 43 03/29/24: 39/80 06/21/24: 38/80 Goal status: INITIAL    ASSESSMENT:  CLINICAL IMPRESSION:   ***. Pt will continue to benefit from skilled therapy to address remaining deficits in order to improve overall QoL and return to PLOF.         OBJECTIVE IMPAIRMENTS: Abnormal gait, cardiopulmonary status limiting activity, decreased activity tolerance, decreased balance, decreased coordination, decreased endurance, decreased knowledge of use of DME, decreased mobility, difficulty walking, decreased ROM, decreased strength, dizziness, impaired perceived functional ability, impaired flexibility, impaired vision/preception, improper body mechanics, postural dysfunction, and obesity.   ACTIVITY LIMITATIONS: carrying, lifting, bending, sitting, standing, squatting, stairs, transfers, bed mobility, toileting, dressing, reach over head, hygiene/grooming, locomotion  level, and caring for others  PARTICIPATION LIMITATIONS: meal prep, cleaning, laundry, driving, shopping, community activity, occupation, and yard work  PERSONAL FACTORS: Age, Behavior pattern, Fitness, Past/current experiences, Time since onset of injury/illness/exacerbation, Transportation, and 3+ comorbidities: DM, HTN, Anemia, Bells palsy, Hypothyroidism, Kidney stones, stroke and pseudotumour cerebri. Macular degeneration with impaired visual acuity  are also affecting patient's functional outcome.   REHAB POTENTIAL: Good  CLINICAL DECISION MAKING: Evolving/moderate complexity  EVALUATION COMPLEXITY: Moderate  PLAN:  PT FREQUENCY: 2x/week  PT DURATION: 12 weeks  PLANNED INTERVENTIONS: 97164- PT Re-evaluation, 97750- Physical Performance Testing, 97110-Therapeutic exercises, 97530- Therapeutic activity, [id number]- Neuromuscular re-education, 97535- Self Care, 02859- Manual therapy, [id number]- Gait training, [id number]- Orthotic Initial, [id number]- Orthotic/Prosthetic subsequent, [phone]- Canalith repositioning, [id number]- Splinting, H9716- Electrical stimulation (unattended), [id number]- Electrical stimulation (manual), [id number]- Vasopneumatic device, [id number]- Ultrasound, [id number]- Traction (mechanical), [id number]- Ionotophoresis 4mg /ml Dexamethasone , Patient/Family education, Balance training, Stair training, Taping, Dry Needling, Joint mobilization, Spinal mobilization, Scar mobilization, Compression bandaging, Vestibular training, Visual/preceptual remediation/compensation, Cognitive remediation, DME instructions, Cryotherapy, Moist heat, and Biofeedback  PLAN FOR NEXT SESSION:  Blaze Pods, Korebalance balance, strength, L ankle strength, stairs, LE endurance   Quinn Bartling  Leopoldo PT ,DPT Physical Therapist- [place]  [facility]  08/12/2024, 9:35 AM

## 2024-08-13 ENCOUNTER — Telehealth

## 2024-08-13 NOTE — Patient Instructions (Signed)
 Gregory Crane - I am sorry I was unable to reach you today for our scheduled appointment. I work with Glendia Shad, MD and am calling to support your healthcare needs. Please contact me at [phone] at your earliest convenience. I look forward to speaking with you soon.   Thank you,  Nestora Duos, MSN, RN [facility]  [facility], [name] RN Care Manager Direct Dial: [phone] Fax: [phone]

## 2024-08-16 ENCOUNTER — Ambulatory Visit

## 2024-08-18 ENCOUNTER — Ambulatory Visit: Admitting: Physical Therapy

## 2024-08-23 ENCOUNTER — Ambulatory Visit

## 2024-08-23 DIAGNOSIS — R262 Difficulty in walking, not elsewhere classified: Secondary | ICD-10-CM

## 2024-08-23 DIAGNOSIS — M6281 Muscle weakness (generalized): Secondary | ICD-10-CM | POA: Diagnosis not present

## 2024-08-23 DIAGNOSIS — R2689 Other abnormalities of gait and mobility: Secondary | ICD-10-CM

## 2024-08-23 DIAGNOSIS — R2681 Unsteadiness on feet: Secondary | ICD-10-CM

## 2024-08-23 NOTE — Therapy (Signed)
 " OUTPATIENT PHYSICAL THERAPY NEURO TREATMENT/RECERT   Patient Name: Gregory Crane MRN: [id number] DOB:[birth year], 53 y.o., male Today's Date: 08/23/2024  PCP: Glendia Shad  REFERRING PROVIDER: Glendia Shad  END OF SESSION:  PT End of Session - 08/23/24 1104     Visit Number 39    Number of Visits 63    Date for Recertification  11/15/24    Progress Note Due on Visit 20    PT Start Time 1104    PT Stop Time 1145    PT Time Calculation (min) 41 min    Equipment Utilized During Treatment Gait belt    Activity Tolerance Patient tolerated treatment well    Behavior During Therapy [facility] for tasks assessed/performed            Past Medical History:  Diagnosis Date   Allergy    Anemia    Bell's palsy    Diabetes mellitus without complication (HCC)    diet controlled   Fall 07/2023   Hypertension    Hypothyroidism    Kidney stones    Pseudotumor cerebri    Stroke [facility])    Past Surgical History:  Procedure Laterality Date   COLONOSCOPY WITH PROPOFOL  N/A 03/30/2020   Procedure: COLONOSCOPY WITH PROPOFOL ;  Surgeon: Maryruth Ole DASEN, MD;  Location: [facility] ENDOSCOPY;  Service: Endoscopy;  Laterality: N/A;   CORONARY STENT INTERVENTION N/A 03/17/2024   Procedure: CORONARY STENT INTERVENTION;  Surgeon: Jordan, Peter M, MD;  Location: [facility] INVASIVE CV LAB;  Service: Cardiovascular;  Laterality: N/A;   LEFT HEART CATH AND CORONARY ANGIOGRAPHY N/A 03/17/2024   Procedure: LEFT HEART CATH AND CORONARY ANGIOGRAPHY;  Surgeon: Jordan, Peter M, MD;  Location: [facility] INVASIVE CV LAB;  Service: Cardiovascular;  Laterality: N/A;   LOOP RECORDER INSERTION N/A 01/27/2018   Procedure: LOOP RECORDER INSERTION;  Surgeon: Fernande Elspeth BROCKS, MD;  Location: [facility] INVASIVE CV LAB;  Service: Cardiovascular;  Laterality: N/A;   LUMBAR PUNCTURE     as child   NO PAST SURGERIES     REMOVAL OF A DIALYSIS CATHETER     TEE WITHOUT CARDIOVERSION N/A 01/07/2018   Procedure: TRANSESOPHAGEAL ECHOCARDIOGRAM  (TEE);  Surgeon: Perla Evalene PARAS, MD;  Location: [facility] ORS;  Service: Cardiovascular;  Laterality: N/A;   Patient Active Problem List   Diagnosis Date Noted   Bradycardia 04/30/2024   CAD S/P percutaneous coronary angioplasty 03/17/2024   Non-ST elevation (NSTEMI) myocardial infarction (HCC) 03/16/2024   Hypervolemia 03/14/2024   Hypoxic respiratory failure (HCC) 03/14/2024   Left hip pain 01/30/2024   SVT (supraventricular tachycardia) 08/10/2023   Atrial fibrillation (HCC) 07/03/2023   Atrial fibrillation with RVR (HCC) 07/02/2023   Fall 06/15/2023   Non-compliance with renal dialysis 05/02/2023   Volume overload 04/29/2023   Renal osteodystrophy 11/25/2022   Unsteady gait 10/05/2022   Steal syndrome of dialysis vascular access 05/28/2022   Hydronephrosis, left 05/04/2022   Leukocytosis 05/04/2022   Diabetic retinopathy associated with diabetes mellitus due to underlying condition (HCC) 01/13/2022   ESRD on hemodialysis (HCC) 01/13/2022   Deafness in right ear 08/11/2021   History of colon polyps 10/15/2020   Type 2 diabetes mellitus, with long-term current use of insulin  (HCC) 09/09/2020   Cryptogenic stroke (HCC) 07/13/2020   History of loop recorder 07/13/2020   Lymphedema 06/08/2019   Anemia 04/17/2019   Swelling of both lower extremities 01/10/2019   Facial droop 04/09/2018   Daytime somnolence 03/30/2018   Carotid artery disease 02/03/2018   Intracranial vascular stenosis 09/12/2017  History of CVA (cerebrovascular accident) 11/10/2016   Benign localized hyperplasia of prostate with urinary obstruction 10/27/2016   History of nephrolithiasis 10/27/2016   TIA (transient ischemic attack) 10/20/2016   Near syncope 06/23/2016   Organic impotence 10/01/2015   Neuropathy 08/06/2015   Health care maintenance 08/06/2015   Hypertension 08/06/2015   Hypothyroidism 10/10/2013   Microalbuminuria 10/10/2013   Hyperlipidemia 10/10/2013   B12 deficiency 10/10/2013    Environmental allergies 07/04/2013    ONSET DATE: 3 years ago  REFERRING DIAG: unsteady gait  THERAPY DIAG:  Muscle weakness (generalized)  Unsteadiness on feet  Other abnormalities of gait and mobility  Difficulty in walking, not elsewhere classified  Rationale for Evaluation and Treatment: Rehabilitation  SUBJECTIVE:                                                                                                                                                                                             SUBJECTIVE STATEMENT:   Patient reports feeling bruised and off today. Reports in dialysis his BP has been dropping.   Pt accompanied by: self  PERTINENT HISTORY: Patient returning to PT s/p hospitalization for pneumonia, , SVT, ESRD on hypodialysis. PMH DM, HTN, Anemia, Bells palsy, Hypothyroidism, Kidney stones, stroke and pseudotumour cerebri. Macular degeneration with impaired visual acuity   PAIN:  Are you having pain? Some L foot numbness/neuropathy  PRECAUTIONS: Fall  RED FLAGS: None   WEIGHT BEARING RESTRICTIONS: No  FALLS: Has patient fallen in last 6 months? No  LIVING ENVIRONMENT: Lives with: lives alone Lives in: House/apartment Stairs: yes stairs in house Has following equipment at home: Single point cane  PLOF: Independent with basic ADLs  PATIENT GOALS: balance, strength  OBJECTIVE:  Note: Objective measures were completed at Evaluation unless otherwise noted.  DIAGNOSTIC FINDINGS: IMPRESSION: Interval resolution of previously noted trace left parafalcine subdural hematoma. No acute intracranial process.  COGNITION: Overall cognitive status: Within functional limits for tasks assessed   SENSATION: WFL  COORDINATION: Heel slide: WFL RLE; slight decrease with LLE   MUSCLE TONE: WFL  POSTURE: rounded shoulders, forward head, and posterior pelvic tilt  LOWER EXTREMITY MMT:    MMT Right Eval Left Eval  Hip flexion 4 4  Hip  extension    Hip abduction 3 3  Hip adduction 3+ 3+  Hip internal rotation    Hip external rotation    Knee flexion 4- 4-  Knee extension 4 4  Ankle dorsiflexion 3 2+  Ankle plantarflexion 3+ 3  Ankle inversion    Ankle eversion    (Blank rows = not tested)  BED MOBILITY:  Not tested  TRANSFERS: Sit to stand: CGA  Assistive device utilized: None     Stand to sit: CGA  Assistive device utilized: None     Chair to chair: CGA  Assistive device utilized: None       RAMP:  Not tested  CURB:  Findings: needs UE support to step up  STAIRS: heavy BUE support step over step negotiation.  GAIT: Findings: Gait Characteristics: decreased stride length, Left foot flat, wide BOS, and poor foot clearance- Left, Distance walked: 60 ft, Assistive device utilized:None, Level of assistance: CGA, and Comments: increased L ankle rolling due to not wearing AFO  FUNCTIONAL TESTS:  5 times sit to stand: 30.53 with 4 posterior LOB 6 minute walk test: perform next session  Berg Balance Scale: 26  PATIENT SURVEYS:  LEFS give next session                                                                                                                               TREATMENT DATE: 08/23/2024 Physical therapy treatment session today consisted of completing assessment of goals and administration of testing as demonstrated and documented in flow sheet, treatment, and goals section of this note. Addition treatments may be found below.   BP: seated: 170/62 standing : 159/60   Pt performed 5 time sit<>stand (5xSTS): 20.12 sec (>15 sec indicates increased fall risk)  BERG:  [facility] PT Assessment - 08/23/24 0001       Observation/Other Assessments   Other Surveys  Lower Extremity Functional Scale      Berg Balance Test   Sit to Stand Able to stand without using hands and stabilize independently    Standing Unsupported Able to stand safely 2 minutes    Sitting with Back Unsupported but Feet Supported  on Floor or Stool Able to sit safely and securely 2 minutes    Stand to Sit Controls descent by using hands    Transfers Able to transfer safely, minor use of hands    Standing Unsupported with Eyes Closed Able to stand 10 seconds with supervision    Standing Unsupported with Feet Together Able to place feet together independently and stand for 1 minute with supervision    From Standing, Reach Forward with Outstretched Arm Can reach confidently >25 cm (10)    From Standing Position, Pick up Object from Floor Able to pick up shoe, needs supervision    From Standing Position, Turn to Look Behind Over each Shoulder Looks behind from both sides and weight shifts well    Turn 360 Degrees Able to turn 360 degrees safely one side only in 4 seconds or less    Standing Unsupported, Alternately Place Feet on Step/Stool Able to stand independently and complete 8 steps >20 seconds    Standing Unsupported, One Foot in Front Able to take small step independently and hold 30 seconds    Standing on One Leg Tries to lift leg/unable to hold 3 seconds but remains  standing independently    Total Score 45          LEFS: 35/80  6 Min Walk Test:  Instructed patient to ambulate as quickly and as safely as possible for 6 minutes using LRAD. Patient was allowed to take standing rest breaks without stopping the test, but if the patient required a sitting rest break the clock would be stopped and the test would be over.  Results: 620 ft with stop at 5 minutes due to two consecutive LOB's  Results indicate that the patient has reduced endurance with ambulation compared to age matched norms.  Age Matched Norms: 26-69 yo M: 74 F: 110, 3-79 yo M: 28 F: 471, 37-89 yo M: 417 F: 392 MDC: 58.21 meters (190.98 feet) or 50 meters (ANPTA Core Set of Outcome Measures for Adults with Neurologic Conditions, 2018)     TE- To improve strength, endurance, mobility, and function of specific targeted muscle groups or improve  joint range of motion or improve muscle flexibility   GTB abduction 15x  GTB march 15x  Seated heel toe raises 20x      PATIENT EDUCATION: Education details: goals, POC, HEP Person educated: Patient Education method: Explanation, Demonstration, Tactile cues, Verbal cues, and Handouts Education comprehension: verbalized understanding, returned demonstration, verbal cues required, tactile cues required, and needs further education  HOME EXERCISE PROGRAM: Access Code: [email or web address] URL: [email or web address] Date: 05/26/2024 Prepared by: Sidra Simpers  Exercises - Leg Extension  - 1 x daily - 7 x weekly - 2 sets - 10 reps - 5 hold - Seated March  - 1 x daily - 7 x weekly - 2 sets - 10 reps - 5 hold - Seated Heel Raise  - 1 x daily - 7 x weekly - 2 sets - 10 reps - 5 hold - Sit to Stand  - 1 x daily - 7 x weekly - 3 sets - 10 reps - Mini Squat with Counter Support  - 1 x daily - 7 x weekly - 3 sets - 10 reps - Standing 4-Way Leg Reach with Counter Support  - 1 x daily - 7 x weekly - 3 sets - 10 reps  GOALS: Goals reviewed with patient? Yes  SHORT TERM GOALS: Target date: 01/26/2024  Patient will be independent in home exercise program to improve strength/mobility for better functional independence with ADLs.  Baseline: 12/29: HEP compliant Goal status: MET  LONG TERM GOALS: Target date 11/15/2024   Patient (< 54 years old) will complete five times sit to stand test in < 10 seconds indicating an increased LE strength and improved balance.  Baseline: 30.53 with 4 posterior LOB 03/29/24: 22.61 with 3 posterior LOB 06/21/24: 21.7 with 2 posterior LOB 12/29: 20.12 seconds with 1 posterior LOB  Goal status: PROGRESSING  2.   Patient will increase Berg Balance score by >45/56 to demonstrate decreased fall risk during functional activities.  Baseline: 5/5: 26  03/29/24: 32 12/29: 45 Goal status: Partially Met   3.    Patient will increase six minute walk test distance to  >1200 for progression to age norm community ambulator and improve gait ability  Baseline: perform next session 5/12: 710 ft  03/29/24: 770' 06/18/24: 792' 12/29: 620 ft with stop at 5 minutes due to two consecutive LOB's  Goal status: PROGRESSING  4.  Patient will score > 60/80 on LEFS for improved functional mobility and quality of life.  Baseline: perform next session 5/12: 43 03/29/24: 39/80 06/21/24: 38/80 12/29: 35/80 Goal  status: Ongoing    ASSESSMENT:  CLINICAL IMPRESSION:   Patient demonstrates excellent progress towards functional goals as can be seen in BERG goal progression. Patient 6 MWT impacted due to poor sleep and feeling poor today resulting in decreased coordination of L foot and decreased clearance. In general patient has been progressing with today being an outlier due to not feeling well.  Patient would like to continue focusing on balance, transfers, walking, and stair negotiation for improved independent safe mobility and quality of life. Pt will continue to benefit from skilled therapy to address remaining deficits in order to improve overall QoL and return to PLOF.         OBJECTIVE IMPAIRMENTS: Abnormal gait, cardiopulmonary status limiting activity, decreased activity tolerance, decreased balance, decreased coordination, decreased endurance, decreased knowledge of use of DME, decreased mobility, difficulty walking, decreased ROM, decreased strength, dizziness, impaired perceived functional ability, impaired flexibility, impaired vision/preception, improper body mechanics, postural dysfunction, and obesity.   ACTIVITY LIMITATIONS: carrying, lifting, bending, sitting, standing, squatting, stairs, transfers, bed mobility, toileting, dressing, reach over head, hygiene/grooming, locomotion level, and caring for others  PARTICIPATION LIMITATIONS: meal prep, cleaning, laundry, driving, shopping, community activity, occupation, and yard work  PERSONAL FACTORS: Age,  Behavior pattern, Fitness, Past/current experiences, Time since onset of injury/illness/exacerbation, Transportation, and 3+ comorbidities: DM, HTN, Anemia, Bells palsy, Hypothyroidism, Kidney stones, stroke and pseudotumour cerebri. Macular degeneration with impaired visual acuity  are also affecting patient's functional outcome.   REHAB POTENTIAL: Good  CLINICAL DECISION MAKING: Evolving/moderate complexity  EVALUATION COMPLEXITY: Moderate  PLAN:  PT FREQUENCY: 2x/week  PT DURATION: 12 weeks  PLANNED INTERVENTIONS: 97164- PT Re-evaluation, 97750- Physical Performance Testing, 97110-Therapeutic exercises, 97530- Therapeutic activity, [id number]- Neuromuscular re-education, 97535- Self Care, [id number]- Manual therapy, [id number]- Gait training, [phone]- Orthotic Initial, [phone]- Orthotic/Prosthetic subsequent, [phone]- Canalith repositioning, [id number]- Splinting, [id number]- Electrical stimulation (unattended), [phone]- Electrical stimulation (manual), [id number]- Vasopneumatic device, [id number]- Ultrasound, [id number]- Traction (mechanical), [id number]- Ionotophoresis 4mg /ml Dexamethasone , Patient/Family education, Balance training, Stair training, Taping, Dry Needling, Joint mobilization, Spinal mobilization, Scar mobilization, Compression bandaging, Vestibular training, Visual/preceptual remediation/compensation, Cognitive remediation, DME instructions, Cryotherapy, Moist heat, and Biofeedback  PLAN FOR NEXT SESSION:  Blaze Pods, Korebalance balance, strength, L ankle strength, stairs, LE endurance   Gianella Chismar  Leopoldo KLEIN ,DPT Physical Therapist- [place]  [facility]  08/23/2024, 11:47 AM   "

## 2024-08-25 ENCOUNTER — Ambulatory Visit

## 2024-08-25 NOTE — Therapy (Incomplete)
 " OUTPATIENT PHYSICAL THERAPY NEURO TREATMENT   Patient Name: Haik Mahoney MRN: [id number] DOB:[birth year], 53 y.o., male Today's Date: 08/25/2024  PCP: Glendia Shad  REFERRING PROVIDER: Glendia Shad  END OF SESSION:      Past Medical History:  Diagnosis Date   Allergy    Anemia    Bell's palsy    Diabetes mellitus without complication (HCC)    diet controlled   Fall 07/2023   Hypertension    Hypothyroidism    Kidney stones    Pseudotumor cerebri    Stroke [facility])    Past Surgical History:  Procedure Laterality Date   COLONOSCOPY WITH PROPOFOL  N/A 03/30/2020   Procedure: COLONOSCOPY WITH PROPOFOL ;  Surgeon: Maryruth Ole DASEN, MD;  Location: [facility] ENDOSCOPY;  Service: Endoscopy;  Laterality: N/A;   CORONARY STENT INTERVENTION N/A 03/17/2024   Procedure: CORONARY STENT INTERVENTION;  Surgeon: Jordan, Peter M, MD;  Location: [facility] INVASIVE CV LAB;  Service: Cardiovascular;  Laterality: N/A;   LEFT HEART CATH AND CORONARY ANGIOGRAPHY N/A 03/17/2024   Procedure: LEFT HEART CATH AND CORONARY ANGIOGRAPHY;  Surgeon: Jordan, Peter M, MD;  Location: [facility] INVASIVE CV LAB;  Service: Cardiovascular;  Laterality: N/A;   LOOP RECORDER INSERTION N/A 01/27/2018   Procedure: LOOP RECORDER INSERTION;  Surgeon: Fernande Elspeth BROCKS, MD;  Location: [facility] INVASIVE CV LAB;  Service: Cardiovascular;  Laterality: N/A;   LUMBAR PUNCTURE     as child   NO PAST SURGERIES     REMOVAL OF A DIALYSIS CATHETER     TEE WITHOUT CARDIOVERSION N/A 01/07/2018   Procedure: TRANSESOPHAGEAL ECHOCARDIOGRAM (TEE);  Surgeon: Perla Evalene PARAS, MD;  Location: [facility] ORS;  Service: Cardiovascular;  Laterality: N/A;   Patient Active Problem List   Diagnosis Date Noted   Bradycardia 04/30/2024   CAD S/P percutaneous coronary angioplasty 03/17/2024   Non-ST elevation (NSTEMI) myocardial infarction (HCC) 03/16/2024   Hypervolemia 03/14/2024   Hypoxic respiratory failure (HCC) 03/14/2024   Left hip pain 01/30/2024   SVT  (supraventricular tachycardia) 08/10/2023   Atrial fibrillation (HCC) 07/03/2023   Atrial fibrillation with RVR (HCC) 07/02/2023   Fall 06/15/2023   Non-compliance with renal dialysis 05/02/2023   Volume overload 04/29/2023   Renal osteodystrophy 11/25/2022   Unsteady gait 10/05/2022   Steal syndrome of dialysis vascular access 05/28/2022   Hydronephrosis, left 05/04/2022   Leukocytosis 05/04/2022   Diabetic retinopathy associated with diabetes mellitus due to underlying condition (HCC) 01/13/2022   ESRD on hemodialysis (HCC) 01/13/2022   Deafness in right ear 08/11/2021   History of colon polyps 10/15/2020   Type 2 diabetes mellitus, with long-term current use of insulin  (HCC) 09/09/2020   Cryptogenic stroke (HCC) 07/13/2020   History of loop recorder 07/13/2020   Lymphedema 06/08/2019   Anemia 04/17/2019   Swelling of both lower extremities 01/10/2019   Facial droop 04/09/2018   Daytime somnolence 03/30/2018   Carotid artery disease 02/03/2018   Intracranial vascular stenosis 09/12/2017   History of CVA (cerebrovascular accident) 11/10/2016   Benign localized hyperplasia of prostate with urinary obstruction 10/27/2016   History of nephrolithiasis 10/27/2016   TIA (transient ischemic attack) 10/20/2016   Near syncope 06/23/2016   Organic impotence 10/01/2015   Neuropathy 08/06/2015   Health care maintenance 08/06/2015   Hypertension 08/06/2015   Hypothyroidism 10/10/2013   Microalbuminuria 10/10/2013   Hyperlipidemia 10/10/2013   B12 deficiency 10/10/2013   Environmental allergies 07/04/2013    ONSET DATE: 3 years ago  REFERRING DIAG: unsteady gait  THERAPY DIAG:  No  diagnosis found.  Rationale for Evaluation and Treatment: Rehabilitation  SUBJECTIVE:                                                                                                                                                                                             SUBJECTIVE STATEMENT:    ***  Pt accompanied by: self  PERTINENT HISTORY: Patient returning to PT s/p hospitalization for pneumonia, , SVT, ESRD on hypodialysis. PMH DM, HTN, Anemia, Bells palsy, Hypothyroidism, Kidney stones, stroke and pseudotumour cerebri. Macular degeneration with impaired visual acuity   PAIN:  Are you having pain? Some L foot numbness/neuropathy  PRECAUTIONS: Fall  RED FLAGS: None   WEIGHT BEARING RESTRICTIONS: No  FALLS: Has patient fallen in last 6 months? No  LIVING ENVIRONMENT: Lives with: lives alone Lives in: House/apartment Stairs: yes stairs in house Has following equipment at home: Single point cane  PLOF: Independent with basic ADLs  PATIENT GOALS: balance, strength  OBJECTIVE:  Note: Objective measures were completed at Evaluation unless otherwise noted.  DIAGNOSTIC FINDINGS: IMPRESSION: Interval resolution of previously noted trace left parafalcine subdural hematoma. No acute intracranial process.  COGNITION: Overall cognitive status: Within functional limits for tasks assessed   SENSATION: WFL  COORDINATION: Heel slide: WFL RLE; slight decrease with LLE   MUSCLE TONE: WFL  POSTURE: rounded shoulders, forward head, and posterior pelvic tilt  LOWER EXTREMITY MMT:    MMT Right Eval Left Eval  Hip flexion 4 4  Hip extension    Hip abduction 3 3  Hip adduction 3+ 3+  Hip internal rotation    Hip external rotation    Knee flexion 4- 4-  Knee extension 4 4  Ankle dorsiflexion 3 2+  Ankle plantarflexion 3+ 3  Ankle inversion    Ankle eversion    (Blank rows = not tested)  BED MOBILITY:  Not tested  TRANSFERS: Sit to stand: CGA  Assistive device utilized: None     Stand to sit: CGA  Assistive device utilized: None     Chair to chair: CGA  Assistive device utilized: None       RAMP:  Not tested  CURB:  Findings: needs UE support to step up  STAIRS: heavy BUE support step over step negotiation.  GAIT: Findings: Gait  Characteristics: decreased stride length, Left foot flat, wide BOS, and poor foot clearance- Left, Distance walked: 60 ft, Assistive device utilized:None, Level of assistance: CGA, and Comments: increased L ankle rolling due to not wearing AFO  FUNCTIONAL TESTS:  5 times sit to stand: 30.53 with 4 posterior LOB 6 minute walk test: perform next session  Berg Balance Scale: 26  PATIENT SURVEYS:  LEFS give next session                                                                                                                               TREATMENT DATE: 08/25/2024 TherAct: To improve functional movements patterns for everyday tasks   The patient completed 8 minutes at level(s) 3-9 intervals on the NuStep using both BUE/BLE reciprocal movements to promote strength, endurance, and cardiorespiratory fitness. PT increased the resistance level and monitored the patient's response to the intervention throughout. The patient required min cueing for technique and supervisions level of assistance. Cues for LLE hip orientation to prevent Hip ER to challenge appropriate musculature   STS 15x with one rest break; seat supported by table ; frequent posterior LOB and posterior stepping.         There were no vitals filed for this visit.       NMR: To facilitate reeducation of movement, balance, posture, coordination, and/or proprioception/kinesthetic sense.   Incline standing at support surface 30 seconds x 2 trials  Airex pad tandem stance with 6 step 30 second holds x 2 trials each LE   TE- To improve strength, endurance, mobility, and function of specific targeted muscle groups or improve joint range of motion or improve muscle flexibility                  Adduction balls squeeze 15x 3 second holds               Adduction squeeze with IR/ER 15x each LE               Dynadisc: heel toe rocking cue for neutral foot alignment 20x each LE ; 2 set each LE        PATIENT EDUCATION: Education  details: goals, POC, HEP Person educated: Patient Education method: Explanation, Demonstration, Tactile cues, Verbal cues, and Handouts Education comprehension: verbalized understanding, returned demonstration, verbal cues required, tactile cues required, and needs further education  HOME EXERCISE PROGRAM: Access Code: [email or web address] URL: [email or web address] Date: 05/26/2024 Prepared by: Sidra Simpers  Exercises - Leg Extension  - 1 x daily - 7 x weekly - 2 sets - 10 reps - 5 hold - Seated March  - 1 x daily - 7 x weekly - 2 sets - 10 reps - 5 hold - Seated Heel Raise  - 1 x daily - 7 x weekly - 2 sets - 10 reps - 5 hold - Sit to Stand  - 1 x daily - 7 x weekly - 3 sets - 10 reps - Mini Squat with Counter Support  - 1 x daily - 7 x weekly - 3 sets - 10 reps - Standing 4-Way Leg Reach with Counter Support  - 1 x daily - 7 x weekly - 3 sets - 10 reps  GOALS: Goals reviewed with patient? Yes  SHORT TERM GOALS: Target date: 01/26/2024  Patient will be independent in home exercise program to improve strength/mobility for better functional independence with ADLs.  Baseline: 12/29: HEP compliant Goal status: MET  LONG TERM GOALS: Target date 11/15/2024   Patient (< 8 years old) will complete five times sit to stand test in < 10 seconds indicating an increased LE strength and improved balance.  Baseline: 30.53 with 4 posterior LOB 03/29/24: 22.61 with 3 posterior LOB 06/21/24: 21.7 with 2 posterior LOB 12/29: 20.12 seconds with 1 posterior LOB  Goal status: PROGRESSING  2.   Patient will increase Berg Balance score by >45/56 to demonstrate decreased fall risk during functional activities.  Baseline: 5/5: 26  03/29/24: 32 12/29: 45 Goal status: Partially Met   3.    Patient will increase six minute walk test distance to >1200 for progression to age norm community ambulator and improve gait ability  Baseline: perform next session 5/12: 710 ft  03/29/24: 770' 06/18/24:  792' 12/29: 620 ft with stop at 5 minutes due to two consecutive LOB's  Goal status: PROGRESSING  4.  Patient will score > 60/80 on LEFS for improved functional mobility and quality of life.  Baseline: perform next session 5/12: 43 03/29/24: 39/80 06/21/24: 38/80 12/29: 35/80 Goal status: Ongoing    ASSESSMENT:  CLINICAL IMPRESSION:   Patient goals performed last session, please refer to previous note for further details. Patient's condition has the potential to improve in response to therapy. Maximum improvement is yet to be obtained. The anticipated improvement is attainable and reasonable in a generally predictable time. *** . Pt will continue to benefit from skilled therapy to address remaining deficits in order to improve overall QoL and return to PLOF.         OBJECTIVE IMPAIRMENTS: Abnormal gait, cardiopulmonary status limiting activity, decreased activity tolerance, decreased balance, decreased coordination, decreased endurance, decreased knowledge of use of DME, decreased mobility, difficulty walking, decreased ROM, decreased strength, dizziness, impaired perceived functional ability, impaired flexibility, impaired vision/preception, improper body mechanics, postural dysfunction, and obesity.   ACTIVITY LIMITATIONS: carrying, lifting, bending, sitting, standing, squatting, stairs, transfers, bed mobility, toileting, dressing, reach over head, hygiene/grooming, locomotion level, and caring for others  PARTICIPATION LIMITATIONS: meal prep, cleaning, laundry, driving, shopping, community activity, occupation, and yard work  PERSONAL FACTORS: Age, Behavior pattern, Fitness, Past/current experiences, Time since onset of injury/illness/exacerbation, Transportation, and 3+ comorbidities: DM, HTN, Anemia, Bells palsy, Hypothyroidism, Kidney stones, stroke and pseudotumour cerebri. Macular degeneration with impaired visual acuity  are also affecting patient's functional outcome.    REHAB POTENTIAL: Good  CLINICAL DECISION MAKING: Evolving/moderate complexity  EVALUATION COMPLEXITY: Moderate  PLAN:  PT FREQUENCY: 2x/week  PT DURATION: 12 weeks  PLANNED INTERVENTIONS: 97164- PT Re-evaluation, 97750- Physical Performance Testing, 97110-Therapeutic exercises, 97530- Therapeutic activity, [id number]- Neuromuscular re-education, 97535- Self Care, [id number]- Manual therapy, [id number]- Gait training, [id number]- Orthotic Initial, [phone]- Orthotic/Prosthetic subsequent, [phone]- Canalith repositioning, [id number]- Splinting, H9716- Electrical stimulation (unattended), [phone]- Electrical stimulation (manual), [id number]- Vasopneumatic device, [id number]- Ultrasound, [id number]- Traction (mechanical), [id number]- Ionotophoresis 4mg /ml Dexamethasone , Patient/Family education, Balance training, Stair training, Taping, Dry Needling, Joint mobilization, Spinal mobilization, Scar mobilization, Compression bandaging, Vestibular training, Visual/preceptual remediation/compensation, Cognitive remediation, DME instructions, Cryotherapy, Moist heat, and Biofeedback  PLAN FOR NEXT SESSION:  Bl[employer]s, Korebalance balance, strength, L ankle strength, stairs, LE endurance   Maikayla Beggs  Leopoldo KLEIN ,DPT Physical Therapist- [place]  [facility]  08/25/2024, 12:00 PM   "

## 2024-08-30 ENCOUNTER — Ambulatory Visit

## 2024-09-01 ENCOUNTER — Ambulatory Visit

## 2024-09-01 NOTE — Therapy (Incomplete)
 " OUTPATIENT PHYSICAL THERAPY NEURO TREATMENT/ Physical Therapy Progress Note   Dates of reporting period  06/21/24   to   09/01/2024     Patient Name: Gregory Crane MRN: [id number] DOB:1971/01/24, 54 y.o., male Today's Date: 09/01/2024  PCP: Glendia Shad  REFERRING PROVIDER: Glendia Shad  END OF SESSION:      Past Medical History:  Diagnosis Date   Allergy    Anemia    Bell's palsy    Diabetes mellitus without complication (HCC)    diet controlled   Fall 07/2023   Hypertension    Hypothyroidism    Kidney stones    Pseudotumor cerebri    Stroke [facility])    Past Surgical History:  Procedure Laterality Date   COLONOSCOPY WITH PROPOFOL  N/A 03/30/2020   Procedure: COLONOSCOPY WITH PROPOFOL ;  Surgeon: Maryruth Ole DASEN, MD;  Location: [facility] ENDOSCOPY;  Service: Endoscopy;  Laterality: N/A;   CORONARY STENT INTERVENTION N/A 03/17/2024   Procedure: CORONARY STENT INTERVENTION;  Surgeon: Jordan, Peter M, MD;  Location: [facility] INVASIVE CV LAB;  Service: Cardiovascular;  Laterality: N/A;   LEFT HEART CATH AND CORONARY ANGIOGRAPHY N/A 03/17/2024   Procedure: LEFT HEART CATH AND CORONARY ANGIOGRAPHY;  Surgeon: Jordan, Peter M, MD;  Location: [facility] INVASIVE CV LAB;  Service: Cardiovascular;  Laterality: N/A;   LOOP RECORDER INSERTION N/A 01/27/2018   Procedure: LOOP RECORDER INSERTION;  Surgeon: Fernande Elspeth BROCKS, MD;  Location: [facility] INVASIVE CV LAB;  Service: Cardiovascular;  Laterality: N/A;   LUMBAR PUNCTURE     as child   NO PAST SURGERIES     REMOVAL OF A DIALYSIS CATHETER     TEE WITHOUT CARDIOVERSION N/A 01/07/2018   Procedure: TRANSESOPHAGEAL ECHOCARDIOGRAM (TEE);  Surgeon: Perla Evalene PARAS, MD;  Location: [facility] ORS;  Service: Cardiovascular;  Laterality: N/A;   Patient Active Problem List   Diagnosis Date Noted   Bradycardia 04/30/2024   CAD S/P percutaneous coronary angioplasty 03/17/2024   Non-ST elevation (NSTEMI) myocardial infarction (HCC) 03/16/2024   Hypervolemia  03/14/2024   Hypoxic respiratory failure (HCC) 03/14/2024   Left hip pain 01/30/2024   SVT (supraventricular tachycardia) 08/10/2023   Atrial fibrillation (HCC) 07/03/2023   Atrial fibrillation with RVR (HCC) 07/02/2023   Fall 06/15/2023   Non-compliance with renal dialysis 05/02/2023   Volume overload 04/29/2023   Renal osteodystrophy 11/25/2022   Unsteady gait 10/05/2022   Steal syndrome of dialysis vascular access 05/28/2022   Hydronephrosis, left 05/04/2022   Leukocytosis 05/04/2022   Diabetic retinopathy associated with diabetes mellitus due to underlying condition (HCC) 01/13/2022   ESRD on hemodialysis (HCC) 01/13/2022   Deafness in right ear 08/11/2021   History of colon polyps 10/15/2020   Type 2 diabetes mellitus, with long-term current use of insulin  (HCC) 09/09/2020   Cryptogenic stroke (HCC) 07/13/2020   History of loop recorder 07/13/2020   Lymphedema 06/08/2019   Anemia 04/17/2019   Swelling of both lower extremities 01/10/2019   Facial droop 04/09/2018   Daytime somnolence 03/30/2018   Carotid artery disease 02/03/2018   Intracranial vascular stenosis 09/12/2017   History of CVA (cerebrovascular accident) 11/10/2016   Benign localized hyperplasia of prostate with urinary obstruction 10/27/2016   History of nephrolithiasis 10/27/2016   TIA (transient ischemic attack) 10/20/2016   Near syncope 06/23/2016   Organic impotence 10/01/2015   Neuropathy 08/06/2015   Health care maintenance 08/06/2015   Hypertension 08/06/2015   Hypothyroidism 10/10/2013   Microalbuminuria 10/10/2013   Hyperlipidemia 10/10/2013   B12 deficiency 10/10/2013   Environmental  allergies 07/04/2013    ONSET DATE: 3 years ago  REFERRING DIAG: unsteady gait  THERAPY DIAG:  No diagnosis found.  Rationale for Evaluation and Treatment: Rehabilitation  SUBJECTIVE:                                                                                                                                                                                              SUBJECTIVE STATEMENT:   ***  Pt accompanied by: self  PERTINENT HISTORY: Patient returning to PT s/p hospitalization for pneumonia, , SVT, ESRD on hypodialysis. PMH DM, HTN, Anemia, Bells palsy, Hypothyroidism, Kidney stones, stroke and pseudotumour cerebri. Macular degeneration with impaired visual acuity   PAIN:  Are you having pain? Some L foot numbness/neuropathy  PRECAUTIONS: Fall  RED FLAGS: None   WEIGHT BEARING RESTRICTIONS: No  FALLS: Has patient fallen in last 6 months? No  LIVING ENVIRONMENT: Lives with: lives alone Lives in: House/apartment Stairs: yes stairs in house Has following equipment at home: Single point cane  PLOF: Independent with basic ADLs  PATIENT GOALS: balance, strength  OBJECTIVE:  Note: Objective measures were completed at Evaluation unless otherwise noted.  DIAGNOSTIC FINDINGS: IMPRESSION: Interval resolution of previously noted trace left parafalcine subdural hematoma. No acute intracranial process.  COGNITION: Overall cognitive status: Within functional limits for tasks assessed   SENSATION: WFL  COORDINATION: Heel slide: WFL RLE; slight decrease with LLE   MUSCLE TONE: WFL  POSTURE: rounded shoulders, forward head, and posterior pelvic tilt  LOWER EXTREMITY MMT:    MMT Right Eval Left Eval  Hip flexion 4 4  Hip extension    Hip abduction 3 3  Hip adduction 3+ 3+  Hip internal rotation    Hip external rotation    Knee flexion 4- 4-  Knee extension 4 4  Ankle dorsiflexion 3 2+  Ankle plantarflexion 3+ 3  Ankle inversion    Ankle eversion    (Blank rows = not tested)  BED MOBILITY:  Not tested  TRANSFERS: Sit to stand: CGA  Assistive device utilized: None     Stand to sit: CGA  Assistive device utilized: None     Chair to chair: CGA  Assistive device utilized: None       RAMP:  Not tested  CURB:  Findings: needs UE support to step  up  STAIRS: heavy BUE support step over step negotiation.  GAIT: Findings: Gait Characteristics: decreased stride length, Left foot flat, wide BOS, and poor foot clearance- Left, Distance walked: 60 ft, Assistive device utilized:None, Level of assistance: CGA, and Comments: increased L ankle rolling due to not wearing AFO  FUNCTIONAL  TESTS:  5 times sit to stand: 30.53 with 4 posterior LOB 6 minute walk test: perform next session  Berg Balance Scale: 26  PATIENT SURVEYS:  LEFS give next session                                                                                                                               TREATMENT DATE: 09/01/2024 TherAct: To improve functional movements patterns for everyday tasks   The patient completed 8 minutes at level(s) 3-9 intervals on the NuStep using both BUE/BLE reciprocal movements to promote strength, endurance, and cardiorespiratory fitness. PT increased the resistance level and monitored the patient's response to the intervention throughout. The patient required min cueing for technique and supervisions level of assistance. Cues for LLE hip orientation to prevent Hip ER to challenge appropriate musculature   STS 15x with one rest break; seat supported by table ; frequent posterior LOB and posterior stepping.         There were no vitals filed for this visit.       NMR: To facilitate reeducation of movement, balance, posture, coordination, and/or proprioception/kinesthetic sense.   Incline standing at support surface 30 seconds x 2 trials  Airex pad tandem stance with 6 step 30 second holds x 2 trials each LE   TE- To improve strength, endurance, mobility, and function of specific targeted muscle groups or improve joint range of motion or improve muscle flexibility                  Adduction balls squeeze 15x 3 second holds               Adduction squeeze with IR/ER 15x each LE               Dynadisc: heel toe rocking cue for neutral  foot alignment 20x each LE ; 2 set each LE        PATIENT EDUCATION: Education details: goals, POC, HEP Person educated: Patient Education method: Explanation, Demonstration, Tactile cues, Verbal cues, and Handouts Education comprehension: verbalized understanding, returned demonstration, verbal cues required, tactile cues required, and needs further education  HOME EXERCISE PROGRAM: Access Code: [email or web address] URL: [email or web address] Date: 05/26/2024 Prepared by: Sidra Simpers  Exercises - Leg Extension  - 1 x daily - 7 x weekly - 2 sets - 10 reps - 5 hold - Seated March  - 1 x daily - 7 x weekly - 2 sets - 10 reps - 5 hold - Seated Heel Raise  - 1 x daily - 7 x weekly - 2 sets - 10 reps - 5 hold - Sit to Stand  - 1 x daily - 7 x weekly - 3 sets - 10 reps - Mini Squat with Counter Support  - 1 x daily - 7 x weekly - 3 sets - 10 reps - Standing 4-Way Leg Reach with Counter Support  - 1 x daily - 7 x weekly -  3 sets - 10 reps  GOALS: Goals reviewed with patient? Yes  SHORT TERM GOALS: Target date: 01/26/2024  Patient will be independent in home exercise program to improve strength/mobility for better functional independence with ADLs.  Baseline: 12/29: HEP compliant Goal status: MET  LONG TERM GOALS: Target date 11/15/2024   Patient (< 74 years old) will complete five times sit to stand test in < 10 seconds indicating an increased LE strength and improved balance.  Baseline: 30.53 with 4 posterior LOB 03/29/24: 22.61 with 3 posterior LOB 06/21/24: 21.7 with 2 posterior LOB 12/29: 20.12 seconds with 1 posterior LOB  Goal status: PROGRESSING  2.   Patient will increase Berg Balance score by >45/56 to demonstrate decreased fall risk during functional activities.  Baseline: 5/5: 26  03/29/24: 32 12/29: 45 Goal status: Partially Met   3.    Patient will increase six minute walk test distance to >1200 for progression to age norm community ambulator and improve gait  ability  Baseline: perform next session 5/12: 710 ft  03/29/24: 770' 06/18/24: 792' 12/29: 620 ft with stop at 5 minutes due to two consecutive LOB's  Goal status: PROGRESSING  4.  Patient will score > 60/80 on LEFS for improved functional mobility and quality of life.  Baseline: perform next session 5/12: 43 03/29/24: 39/80 06/21/24: 38/80 12/29: 35/80 Goal status: Ongoing    ASSESSMENT:  CLINICAL IMPRESSION:   Patient goals performed last session, please refer to previous note for further details. Patient's condition has the potential to improve in response to therapy. Maximum improvement is yet to be obtained. The anticipated improvement is attainable and reasonable in a generally predictable time. *** . Pt will continue to benefit from skilled therapy to address remaining deficits in order to improve overall QoL and return to PLOF.         OBJECTIVE IMPAIRMENTS: Abnormal gait, cardiopulmonary status limiting activity, decreased activity tolerance, decreased balance, decreased coordination, decreased endurance, decreased knowledge of use of DME, decreased mobility, difficulty walking, decreased ROM, decreased strength, dizziness, impaired perceived functional ability, impaired flexibility, impaired vision/preception, improper body mechanics, postural dysfunction, and obesity.   ACTIVITY LIMITATIONS: carrying, lifting, bending, sitting, standing, squatting, stairs, transfers, bed mobility, toileting, dressing, reach over head, hygiene/grooming, locomotion level, and caring for others  PARTICIPATION LIMITATIONS: meal prep, cleaning, laundry, driving, shopping, community activity, occupation, and yard work  PERSONAL FACTORS: Age, Behavior pattern, Fitness, Past/current experiences, Time since onset of injury/illness/exacerbation, Transportation, and 3+ comorbidities: DM, HTN, Anemia, Bells palsy, Hypothyroidism, Kidney stones, stroke and pseudotumour cerebri. Macular degeneration with  impaired visual acuity  are also affecting patient's functional outcome.   REHAB POTENTIAL: Good  CLINICAL DECISION MAKING: Evolving/moderate complexity  EVALUATION COMPLEXITY: Moderate  PLAN:  PT FREQUENCY: 2x/week  PT DURATION: 12 weeks  PLANNED INTERVENTIONS: 97164- PT Re-evaluation, 97750- Physical Performance Testing, 97110-Therapeutic exercises, 97530- Therapeutic activity, [id number]- Neuromuscular re-education, 97535- Self Care, [id number]- Manual therapy, [id number]- Gait training, [id number]- Orthotic Initial, [id number]- Orthotic/Prosthetic subsequent, [phone]- Canalith repositioning, [id number]- Splinting, H9716- Electrical stimulation (unattended), [phone]- Electrical stimulation (manual), [id number]- Vasopneumatic device, [id number]- Ultrasound, [id number]- Traction (mechanical), [id number]- Ionotophoresis 4mg /ml Dexamethasone , Patient/Family education, Balance training, Stair training, Taping, Dry Needling, Joint mobilization, Spinal mobilization, Scar mobilization, Compression bandaging, Vestibular training, Visual/preceptual remediation/compensation, Cognitive remediation, DME instructions, Cryotherapy, Moist heat, and Biofeedback  PLAN FOR NEXT SESSION:  Blaze Pods, Korebalance balance, strength, L ankle strength, stairs, LE endurance   Honor Fairbank  Leopoldo KLEIN ,DPT Physical Therapist- [place]  [facility]  09/01/2024, 7:48 AM   "

## 2024-09-02 NOTE — Progress Notes (Signed)
 Thank you for reaching out to him and thank you for the update.    Dr Glendia

## 2024-09-02 NOTE — Patient Outreach (Signed)
 RNCM contacted patient to reschedule missed appointment - declining RN Cae management at this time.  Aware can request referral from PCP as needed. PCP notified.

## 2024-09-06 ENCOUNTER — Ambulatory Visit: Attending: Internal Medicine | Admitting: Physical Therapy

## 2024-09-06 ENCOUNTER — Encounter: Payer: Self-pay | Admitting: Physical Therapy

## 2024-09-06 DIAGNOSIS — R2689 Other abnormalities of gait and mobility: Secondary | ICD-10-CM | POA: Insufficient documentation

## 2024-09-06 DIAGNOSIS — R262 Difficulty in walking, not elsewhere classified: Secondary | ICD-10-CM | POA: Insufficient documentation

## 2024-09-06 DIAGNOSIS — R269 Unspecified abnormalities of gait and mobility: Secondary | ICD-10-CM | POA: Insufficient documentation

## 2024-09-06 DIAGNOSIS — R2681 Unsteadiness on feet: Secondary | ICD-10-CM | POA: Insufficient documentation

## 2024-09-06 DIAGNOSIS — M6281 Muscle weakness (generalized): Secondary | ICD-10-CM | POA: Insufficient documentation

## 2024-09-06 NOTE — Therapy (Deleted)
 " OUTPATIENT PHYSICAL THERAPY NEURO TREATMENT/ RE-CERTIFICATION NOTE ***     Patient Name: Gregory Crane MRN: [id number] DOB:05/29/1971, 54 y.o., male Today's Date: 09/06/2024  PCP: Glendia Shad  REFERRING PROVIDER: Glendia Shad  END OF SESSION:      Past Medical History:  Diagnosis Date   Allergy    Anemia    Bell's palsy    Diabetes mellitus without complication (HCC)    diet controlled   Fall 07/2023   Hypertension    Hypothyroidism    Kidney stones    Pseudotumor cerebri    Stroke [facility])    Past Surgical History:  Procedure Laterality Date   COLONOSCOPY WITH PROPOFOL  N/A 03/30/2020   Procedure: COLONOSCOPY WITH PROPOFOL ;  Surgeon: Maryruth Ole DASEN, MD;  Location: [facility] ENDOSCOPY;  Service: Endoscopy;  Laterality: N/A;   CORONARY STENT INTERVENTION N/A 03/17/2024   Procedure: CORONARY STENT INTERVENTION;  Surgeon: Jordan, Peter M, MD;  Location: [facility] INVASIVE CV LAB;  Service: Cardiovascular;  Laterality: N/A;   LEFT HEART CATH AND CORONARY ANGIOGRAPHY N/A 03/17/2024   Procedure: LEFT HEART CATH AND CORONARY ANGIOGRAPHY;  Surgeon: Jordan, Peter M, MD;  Location: [facility] INVASIVE CV LAB;  Service: Cardiovascular;  Laterality: N/A;   LOOP RECORDER INSERTION N/A 01/27/2018   Procedure: LOOP RECORDER INSERTION;  Surgeon: Fernande Elspeth BROCKS, MD;  Location: [facility] INVASIVE CV LAB;  Service: Cardiovascular;  Laterality: N/A;   LUMBAR PUNCTURE     as child   NO PAST SURGERIES     REMOVAL OF A DIALYSIS CATHETER     TEE WITHOUT CARDIOVERSION N/A 01/07/2018   Procedure: TRANSESOPHAGEAL ECHOCARDIOGRAM (TEE);  Surgeon: Perla Evalene PARAS, MD;  Location: [facility] ORS;  Service: Cardiovascular;  Laterality: N/A;   Patient Active Problem List   Diagnosis Date Noted   Bradycardia 04/30/2024   CAD S/P percutaneous coronary angioplasty 03/17/2024   Non-ST elevation (NSTEMI) myocardial infarction (HCC) 03/16/2024   Hypervolemia 03/14/2024   Hypoxic respiratory failure (HCC) 03/14/2024    Left hip pain 01/30/2024   SVT (supraventricular tachycardia) 08/10/2023   Atrial fibrillation (HCC) 07/03/2023   Atrial fibrillation with RVR (HCC) 07/02/2023   Fall 06/15/2023   Non-compliance with renal dialysis 05/02/2023   Volume overload 04/29/2023   Renal osteodystrophy 11/25/2022   Unsteady gait 10/05/2022   Steal syndrome of dialysis vascular access 05/28/2022   Hydronephrosis, left 05/04/2022   Leukocytosis 05/04/2022   Diabetic retinopathy associated with diabetes mellitus due to underlying condition (HCC) 01/13/2022   ESRD on hemodialysis (HCC) 01/13/2022   Deafness in right ear 08/11/2021   History of colon polyps 10/15/2020   Type 2 diabetes mellitus, with long-term current use of insulin  (HCC) 09/09/2020   Cryptogenic stroke (HCC) 07/13/2020   History of loop recorder 07/13/2020   Lymphedema 06/08/2019   Anemia 04/17/2019   Swelling of both lower extremities 01/10/2019   Facial droop 04/09/2018   Daytime somnolence 03/30/2018   Carotid artery disease 02/03/2018   Intracranial vascular stenosis 09/12/2017   History of CVA (cerebrovascular accident) 11/10/2016   Benign localized hyperplasia of prostate with urinary obstruction 10/27/2016   History of nephrolithiasis 10/27/2016   TIA (transient ischemic attack) 10/20/2016   Near syncope 06/23/2016   Organic impotence 10/01/2015   Neuropathy 08/06/2015   Health care maintenance 08/06/2015   Hypertension 08/06/2015   Hypothyroidism 10/10/2013   Microalbuminuria 10/10/2013   Hyperlipidemia 10/10/2013   B12 deficiency 10/10/2013   Environmental allergies 07/04/2013    ONSET DATE: 3 years ago  REFERRING DIAG: unsteady gait  THERAPY DIAG:  No diagnosis found.  Rationale for Evaluation and Treatment: Rehabilitation  SUBJECTIVE:                                                                                                                                                                                              SUBJECTIVE STATEMENT:   ***  Pt accompanied by: self  PERTINENT HISTORY: Patient returning to PT s/p hospitalization for pneumonia, , SVT, ESRD on hypodialysis. PMH DM, HTN, Anemia, Bells palsy, Hypothyroidism, Kidney stones, stroke and pseudotumour cerebri. Macular degeneration with impaired visual acuity   PAIN:  Are you having pain? Some L foot numbness/neuropathy  PRECAUTIONS: Fall  RED FLAGS: None   WEIGHT BEARING RESTRICTIONS: No  FALLS: Has patient fallen in last 6 months? No  LIVING ENVIRONMENT: Lives with: lives alone Lives in: House/apartment Stairs: yes stairs in house Has following equipment at home: Single point cane  PLOF: Independent with basic ADLs  PATIENT GOALS: balance, strength  OBJECTIVE:  Note: Objective measures were completed at Evaluation unless otherwise noted.  DIAGNOSTIC FINDINGS: IMPRESSION: Interval resolution of previously noted trace left parafalcine subdural hematoma. No acute intracranial process.  COGNITION: Overall cognitive status: Within functional limits for tasks assessed   SENSATION: WFL  COORDINATION: Heel slide: WFL RLE; slight decrease with LLE   MUSCLE TONE: WFL  POSTURE: rounded shoulders, forward head, and posterior pelvic tilt  LOWER EXTREMITY MMT:    MMT Right Eval Left Eval  Hip flexion 4 4  Hip extension    Hip abduction 3 3  Hip adduction 3+ 3+  Hip internal rotation    Hip external rotation    Knee flexion 4- 4-  Knee extension 4 4  Ankle dorsiflexion 3 2+  Ankle plantarflexion 3+ 3  Ankle inversion    Ankle eversion    (Blank rows = not tested)  BED MOBILITY:  Not tested  TRANSFERS: Sit to stand: CGA  Assistive device utilized: None     Stand to sit: CGA  Assistive device utilized: None     Chair to chair: CGA  Assistive device utilized: None       RAMP:  Not tested  CURB:  Findings: needs UE support to step up  STAIRS: heavy BUE support step over step  negotiation.  GAIT: Findings: Gait Characteristics: decreased stride length, Left foot flat, wide BOS, and poor foot clearance- Left, Distance walked: 60 ft, Assistive device utilized:None, Level of assistance: CGA, and Comments: increased L ankle rolling due to not wearing AFO  FUNCTIONAL TESTS:  5 times sit to stand: 30.53 with 4 posterior LOB 6 minute walk test:  perform next session  Berg Balance Scale: 26  PATIENT SURVEYS:  LEFS give next session                                                                                                                               TREATMENT DATE: 09/06/2024 TherAct: To improve functional movements patterns for everyday tasks   The patient completed 8 minutes at level(s) 3-9 intervals on the NuStep using both BUE/BLE reciprocal movements to promote strength, endurance, and cardiorespiratory fitness. PT increased the resistance level and monitored the patient's response to the intervention throughout. The patient required min cueing for technique and supervisions level of assistance. Cues for LLE hip orientation to prevent Hip ER to challenge appropriate musculature   STS 15x with one rest break; seat supported by table ; frequent posterior LOB and posterior stepping.         There were no vitals filed for this visit.       NMR: To facilitate reeducation of movement, balance, posture, coordination, and/or proprioception/kinesthetic sense.   Incline standing at support surface 30 seconds x 2 trials  Airex pad tandem stance with 6 step 30 second holds x 2 trials each LE   TE- To improve strength, endurance, mobility, and function of specific targeted muscle groups or improve joint range of motion or improve muscle flexibility                  Adduction balls squeeze 15x 3 second holds               Adduction squeeze with IR/ER 15x each LE               Dynadisc: heel toe rocking cue for neutral foot alignment 20x each LE ; 2 set each LE         PATIENT EDUCATION: Education details: goals, POC, HEP Person educated: Patient Education method: Explanation, Demonstration, Tactile cues, Verbal cues, and Handouts Education comprehension: verbalized understanding, returned demonstration, verbal cues required, tactile cues required, and needs further education  HOME EXERCISE PROGRAM: Access Code: [email or web address] URL: [email or web address] Date: 05/26/2024 Prepared by: Sidra Simpers  Exercises - Leg Extension  - 1 x daily - 7 x weekly - 2 sets - 10 reps - 5 hold - Seated March  - 1 x daily - 7 x weekly - 2 sets - 10 reps - 5 hold - Seated Heel Raise  - 1 x daily - 7 x weekly - 2 sets - 10 reps - 5 hold - Sit to Stand  - 1 x daily - 7 x weekly - 3 sets - 10 reps - Mini Squat with Counter Support  - 1 x daily - 7 x weekly - 3 sets - 10 reps - Standing 4-Way Leg Reach with Counter Support  - 1 x daily - 7 x weekly - 3 sets - 10 reps  GOALS: Goals reviewed with patient? Yes  SHORT TERM GOALS:  Target date: 01/26/2024  Patient will be independent in home exercise program to improve strength/mobility for better functional independence with ADLs.  Baseline: 12/29: HEP compliant Goal status: MET  LONG TERM GOALS: Target date 11/15/2024   Patient (< 27 years old) will complete five times sit to stand test in < 10 seconds indicating an increased LE strength and improved balance.  Baseline: 30.53 with 4 posterior LOB 03/29/24: 22.61 with 3 posterior LOB 06/21/24: 21.7 with 2 posterior LOB 12/29: 20.12 seconds with 1 posterior LOB  Goal status: PROGRESSING  2.   Patient will increase Berg Balance score by >45/56 to demonstrate decreased fall risk during functional activities.  Baseline: 5/5: 26  03/29/24: 32 12/29: 45 Goal status: Partially Met   3.    Patient will increase six minute walk test distance to >1200 for progression to age norm community ambulator and improve gait ability  Baseline: perform next session 5/12: 710  ft  03/29/24: 770' 06/18/24: 792' 12/29: 620 ft with stop at 5 minutes due to two consecutive LOB's  Goal status: PROGRESSING  4.  Patient will score > 60/80 on LEFS for improved functional mobility and quality of life.  Baseline: perform next session 5/12: 43 03/29/24: 39/80 06/21/24: 38/80 12/29: 35/80 Goal status: Ongoing    ASSESSMENT:  CLINICAL IMPRESSION:   Patient goals performed last session, please refer to previous note for further details. Patient's condition has the potential to improve in response to therapy. Maximum improvement is yet to be obtained. The anticipated improvement is attainable and reasonable in a generally predictable time. *** . Pt will continue to benefit from skilled therapy to address remaining deficits in order to improve overall QoL and return to PLOF.         OBJECTIVE IMPAIRMENTS: Abnormal gait, cardiopulmonary status limiting activity, decreased activity tolerance, decreased balance, decreased coordination, decreased endurance, decreased knowledge of use of DME, decreased mobility, difficulty walking, decreased ROM, decreased strength, dizziness, impaired perceived functional ability, impaired flexibility, impaired vision/preception, improper body mechanics, postural dysfunction, and obesity.   ACTIVITY LIMITATIONS: carrying, lifting, bending, sitting, standing, squatting, stairs, transfers, bed mobility, toileting, dressing, reach over head, hygiene/grooming, locomotion level, and caring for others  PARTICIPATION LIMITATIONS: meal prep, cleaning, laundry, driving, shopping, community activity, occupation, and yard work  PERSONAL FACTORS: Age, Behavior pattern, Fitness, Past/current experiences, Time since onset of injury/illness/exacerbation, Transportation, and 3+ comorbidities: DM, HTN, Anemia, Bells palsy, Hypothyroidism, Kidney stones, stroke and pseudotumour cerebri. Macular degeneration with impaired visual acuity  are also affecting patient's  functional outcome.   REHAB POTENTIAL: Good  CLINICAL DECISION MAKING: Evolving/moderate complexity  EVALUATION COMPLEXITY: Moderate  PLAN:  PT FREQUENCY: 2x/week  PT DURATION: 12 weeks  PLANNED INTERVENTIONS: 97164- PT Re-evaluation, 97750- Physical Performance Testing, 97110-Therapeutic exercises, 97530- Therapeutic activity, [id number]- Neuromuscular re-education, 97535- Self Care, [id number]- Manual therapy, [id number]- Gait training, [id number]- Orthotic Initial, [phone]- Orthotic/Prosthetic subsequent, [id number]- Canalith repositioning, [id number]- Splinting, H9716- Electrical stimulation (unattended), [id number]- Electrical stimulation (manual), [id number]- Vasopneumatic device, [id number]- Ultrasound, [id number]- Traction (mechanical), [id number]- Ionotophoresis 4mg /ml Dexamethasone , Patient/Family education, Balance training, Stair training, Taping, Dry Needling, Joint mobilization, Spinal mobilization, Scar mobilization, Compression bandaging, Vestibular training, Visual/preceptual remediation/compensation, Cognitive remediation, DME instructions, Cryotherapy, Moist heat, and Biofeedback  PLAN FOR NEXT SESSION:  Blaze Pods, Korebalance balance, strength, L ankle strength, stairs, LE endurance   Lonni KATHEE Gainer PT ,DPT Physical Therapist- [place]  [facility]  09/06/2024, 7:55 AM   "

## 2024-09-06 NOTE — Therapy (Signed)
 " OUTPATIENT PHYSICAL THERAPY NEURO TREATMENT/ Physical Therapy Progress Note   Dates of reporting period  06/21/24   to   09/06/2024     Patient Name: Gregory Crane MRN: [id number] DOB:1971-05-01, 54 y.o., male Today's Date: 09/06/2024  PCP: Glendia Shad  REFERRING PROVIDER: Glendia Shad  END OF SESSION:  PT End of Session - 09/06/24 1116     Visit Number 40    Number of Visits 63    Date for Recertification  11/15/24    Progress Note Due on Visit 20    PT Start Time 1100    PT Stop Time 1142    PT Time Calculation (min) 42 min    Equipment Utilized During Treatment Gait belt    Activity Tolerance Patient tolerated treatment well    Behavior During Therapy [facility] for tasks assessed/performed             Past Medical History:  Diagnosis Date   Allergy    Anemia    Bell's palsy    Diabetes mellitus without complication (HCC)    diet controlled   Fall 07/2023   Hypertension    Hypothyroidism    Kidney stones    Pseudotumor cerebri    Stroke [facility])    Past Surgical History:  Procedure Laterality Date   COLONOSCOPY WITH PROPOFOL  N/A 03/30/2020   Procedure: COLONOSCOPY WITH PROPOFOL ;  Surgeon: Maryruth Ole DASEN, MD;  Location: [facility] ENDOSCOPY;  Service: Endoscopy;  Laterality: N/A;   CORONARY STENT INTERVENTION N/A 03/17/2024   Procedure: CORONARY STENT INTERVENTION;  Surgeon: Jordan, Peter M, MD;  Location: [facility] INVASIVE CV LAB;  Service: Cardiovascular;  Laterality: N/A;   LEFT HEART CATH AND CORONARY ANGIOGRAPHY N/A 03/17/2024   Procedure: LEFT HEART CATH AND CORONARY ANGIOGRAPHY;  Surgeon: Jordan, Peter M, MD;  Location: [facility] INVASIVE CV LAB;  Service: Cardiovascular;  Laterality: N/A;   LOOP RECORDER INSERTION N/A 01/27/2018   Procedure: LOOP RECORDER INSERTION;  Surgeon: Fernande Elspeth BROCKS, MD;  Location: [facility] INVASIVE CV LAB;  Service: Cardiovascular;  Laterality: N/A;   LUMBAR PUNCTURE     as child   NO PAST SURGERIES     REMOVAL OF A DIALYSIS CATHETER      TEE WITHOUT CARDIOVERSION N/A 01/07/2018   Procedure: TRANSESOPHAGEAL ECHOCARDIOGRAM (TEE);  Surgeon: Perla Evalene PARAS, MD;  Location: [facility] ORS;  Service: Cardiovascular;  Laterality: N/A;   Patient Active Problem List   Diagnosis Date Noted   Bradycardia 04/30/2024   CAD S/P percutaneous coronary angioplasty 03/17/2024   Non-ST elevation (NSTEMI) myocardial infarction (HCC) 03/16/2024   Hypervolemia 03/14/2024   Hypoxic respiratory failure (HCC) 03/14/2024   Left hip pain 01/30/2024   SVT (supraventricular tachycardia) 08/10/2023   Atrial fibrillation (HCC) 07/03/2023   Atrial fibrillation with RVR (HCC) 07/02/2023   Fall 06/15/2023   Non-compliance with renal dialysis 05/02/2023   Volume overload 04/29/2023   Renal osteodystrophy 11/25/2022   Unsteady gait 10/05/2022   Steal syndrome of dialysis vascular access 05/28/2022   Hydronephrosis, left 05/04/2022   Leukocytosis 05/04/2022   Diabetic retinopathy associated with diabetes mellitus due to underlying condition (HCC) 01/13/2022   ESRD on hemodialysis (HCC) 01/13/2022   Deafness in right ear 08/11/2021   History of colon polyps 10/15/2020   Type 2 diabetes mellitus, with long-term current use of insulin  (HCC) 09/09/2020   Cryptogenic stroke (HCC) 07/13/2020   History of loop recorder 07/13/2020   Lymphedema 06/08/2019   Anemia 04/17/2019   Swelling of both lower extremities 01/10/2019  Facial droop 04/09/2018   Daytime somnolence 03/30/2018   Carotid artery disease 02/03/2018   Intracranial vascular stenosis 09/12/2017   History of CVA (cerebrovascular accident) 11/10/2016   Benign localized hyperplasia of prostate with urinary obstruction 10/27/2016   History of nephrolithiasis 10/27/2016   TIA (transient ischemic attack) 10/20/2016   Near syncope 06/23/2016   Organic impotence 10/01/2015   Neuropathy 08/06/2015   Health care maintenance 08/06/2015   Hypertension 08/06/2015   Hypothyroidism 10/10/2013    Microalbuminuria 10/10/2013   Hyperlipidemia 10/10/2013   B12 deficiency 10/10/2013   Environmental allergies 07/04/2013    ONSET DATE: 3 years ago  REFERRING DIAG: unsteady gait  THERAPY DIAG:  No diagnosis found.  Rationale for Evaluation and Treatment: Rehabilitation  SUBJECTIVE:                                                                                                                                                                                             SUBJECTIVE STATEMENT:   Patient reports he has been doing well and has has had issues getting to therapy due to transportation issues.  Overall no significant changes since last session of note.  Pt accompanied by: self  PERTINENT HISTORY: Patient returning to PT s/p hospitalization for pneumonia, , SVT, ESRD on hypodialysis. PMH DM, HTN, Anemia, Bells palsy, Hypothyroidism, Kidney stones, stroke and pseudotumour cerebri. Macular degeneration with impaired visual acuity   PAIN:  Are you having pain? Some L foot numbness/neuropathy  PRECAUTIONS: Fall  RED FLAGS: None   WEIGHT BEARING RESTRICTIONS: No  FALLS: Has patient fallen in last 6 months? No  LIVING ENVIRONMENT: Lives with: lives alone Lives in: House/apartment Stairs: yes stairs in house Has following equipment at home: Single point cane  PLOF: Independent with basic ADLs  PATIENT GOALS: balance, strength  OBJECTIVE:  Note: Objective measures were completed at Evaluation unless otherwise noted.  DIAGNOSTIC FINDINGS: IMPRESSION: Interval resolution of previously noted trace left parafalcine subdural hematoma. No acute intracranial process.  COGNITION: Overall cognitive status: Within functional limits for tasks assessed   SENSATION: WFL  COORDINATION: Heel slide: WFL RLE; slight decrease with LLE   MUSCLE TONE: WFL  POSTURE: rounded shoulders, forward head, and posterior pelvic tilt  LOWER EXTREMITY MMT:    MMT Right Eval  Left Eval  Hip flexion 4 4  Hip extension    Hip abduction 3 3  Hip adduction 3+ 3+  Hip internal rotation    Hip external rotation    Knee flexion 4- 4-  Knee extension 4 4  Ankle dorsiflexion 3 2+  Ankle plantarflexion 3+ 3  Ankle inversion  Ankle eversion    (Blank rows = not tested)  BED MOBILITY:  Not tested  TRANSFERS: Sit to stand: CGA  Assistive device utilized: None     Stand to sit: CGA  Assistive device utilized: None     Chair to chair: CGA  Assistive device utilized: None       RAMP:  Not tested  CURB:  Findings: needs UE support to step up  STAIRS: heavy BUE support step over step negotiation.  GAIT: Findings: Gait Characteristics: decreased stride length, Left foot flat, wide BOS, and poor foot clearance- Left, Distance walked: 60 ft, Assistive device utilized:None, Level of assistance: CGA, and Comments: increased L ankle rolling due to not wearing AFO  FUNCTIONAL TESTS:  5 times sit to stand: 30.53 with 4 posterior LOB 6 minute walk test: perform next session  Berg Balance Scale: 26  PATIENT SURVEYS:  LEFS give next session                                                                                                                               TREATMENT DATE: 09/06/2024 TherAct: To improve functional movements patterns for everyday tasks   The patient completed 8 minutes at level(s) 4-8 intervals on the NuStep using both BUE/BLE reciprocal movements to promote strength, endurance, and cardiorespiratory fitness. PT increased the resistance level and monitored the patient's response to the intervention throughout. The patient required min cueing for technique and supervisions level of assistance. Cues for LLE hip orientation to prevent Hip ER to challenge appropriate musculature  STS with cues for proper weight shift and sequencing to prevent Post LOB - no UE but occasional CGA to min A to prevent post LOB 2 x 10       NMR: To facilitate  reeducation of movement, balance, posture, coordination, and/or proprioception/kinesthetic sense.   Stance practice with more NBOS than self selected - 1 LE on floor and other on 6 in step anterior 3 x 30 sec ea   NBOS on airex 3 x 30 sec     Forward and retrogait x 3 rounds x 30 feet each.  Patient provided with cues for proper step length as well as to ensure to weight-bear through his anterior foot to prevent posterior loss of balance.  Overall patient did this well and was responsive to cues.     PATIENT EDUCATION: Education details: goals, POC, HEP Person educated: Patient Education method: Explanation, Demonstration, Tactile cues, Verbal cues, and Handouts Education comprehension: verbalized understanding, returned demonstration, verbal cues required, tactile cues required, and needs further education  HOME EXERCISE PROGRAM: Access Code: [email or web address] URL: [email or web address] Date: 05/26/2024 Prepared by: Sidra Simpers  Exercises - Leg Extension  - 1 x daily - 7 x weekly - 2 sets - 10 reps - 5 hold - Seated March  - 1 x daily - 7 x weekly - 2 sets - 10 reps - 5 hold - Seated Heel Raise  -  1 x daily - 7 x weekly - 2 sets - 10 reps - 5 hold - Sit to Stand  - 1 x daily - 7 x weekly - 3 sets - 10 reps - Mini Squat with Counter Support  - 1 x daily - 7 x weekly - 3 sets - 10 reps - Standing 4-Way Leg Reach with Counter Support  - 1 x daily - 7 x weekly - 3 sets - 10 reps  GOALS: Goals reviewed with patient? Yes  SHORT TERM GOALS: Target date: 01/26/2024  Patient will be independent in home exercise program to improve strength/mobility for better functional independence with ADLs.  Baseline: 12/29: HEP compliant Goal status: MET  LONG TERM GOALS: Target date 11/15/2024   Patient (< 13 years old) will complete five times sit to stand test in < 10 seconds indicating an increased LE strength and improved balance.  Baseline: 30.53 with 4 posterior LOB 03/29/24: 22.61  with 3 posterior LOB 06/21/24: 21.7 with 2 posterior LOB 12/29: 20.12 seconds with 1 posterior LOB  Goal status: PROGRESSING  2.   Patient will increase Berg Balance score by >45/56 to demonstrate decreased fall risk during functional activities.  Baseline: 5/5: 26  03/29/24: 32 12/29: 45 Goal status: Partially Met   3.    Patient will increase six minute walk test distance to >1200 for progression to age norm community ambulator and improve gait ability  Baseline: perform next session 5/12: 710 ft  03/29/24: 770' 06/18/24: 792' 12/29: 620 ft with stop at 5 minutes due to two consecutive LOB's  Goal status: PROGRESSING  4.  Patient will score > 60/80 on LEFS for improved functional mobility and quality of life.  Baseline: perform next session 5/12: 43 03/29/24: 39/80 06/21/24: 38/80 12/29: 35/80 Goal status: Ongoing    ASSESSMENT:  CLINICAL IMPRESSION:   Patient goals performed last session, please refer to previous note for further details. Patient's condition has the potential to improve in response to therapy. Maximum improvement is yet to be obtained. The anticipated improvement is attainable and reasonable in a generally predictable time.  Patient continued with physical therapy this date as in accordance with his previous physical therapy sessions.  Patient continues to be challenged with anterior weightbearing with sit to stand but with cues and practice demonstrate some improvement.  Also transition to some retrowalking to further reintegrate weightbearing through his anterior feet. Pt will continue to benefit from skilled therapy to address remaining deficits in order to improve overall QoL and return to PLOF.         OBJECTIVE IMPAIRMENTS: Abnormal gait, cardiopulmonary status limiting activity, decreased activity tolerance, decreased balance, decreased coordination, decreased endurance, decreased knowledge of use of DME, decreased mobility, difficulty walking, decreased  ROM, decreased strength, dizziness, impaired perceived functional ability, impaired flexibility, impaired vision/preception, improper body mechanics, postural dysfunction, and obesity.   ACTIVITY LIMITATIONS: carrying, lifting, bending, sitting, standing, squatting, stairs, transfers, bed mobility, toileting, dressing, reach over head, hygiene/grooming, locomotion level, and caring for others  PARTICIPATION LIMITATIONS: meal prep, cleaning, laundry, driving, shopping, community activity, occupation, and yard work  PERSONAL FACTORS: Age, Behavior pattern, Fitness, Past/current experiences, Time since onset of injury/illness/exacerbation, Transportation, and 3+ comorbidities: DM, HTN, Anemia, Bells palsy, Hypothyroidism, Kidney stones, stroke and pseudotumour cerebri. Macular degeneration with impaired visual acuity  are also affecting patient's functional outcome.   REHAB POTENTIAL: Good  CLINICAL DECISION MAKING: Evolving/moderate complexity  EVALUATION COMPLEXITY: Moderate  PLAN:  PT FREQUENCY: 2x/week  PT DURATION: 12 weeks  PLANNED INTERVENTIONS: 97164- PT Re-evaluation, 97750- Physical Performance Testing, 97110-Therapeutic exercises, 97530- Therapeutic activity, [id number]- Neuromuscular re-education, 97535- Self Care, [id number]- Manual therapy, [id number]- Gait training, [id number]- Orthotic Initial, [phone]- Orthotic/Prosthetic subsequent, [phone]- Canalith repositioning, [phone]- Splinting, H9716- Electrical stimulation (unattended), [phone]- Electrical stimulation (manual), [id number]- Vasopneumatic device, [id number]- Ultrasound, [id number]- Traction (mechanical), [id number]- Ionotophoresis 4mg /ml Dexamethasone , Patient/Family education, Balance training, Stair training, Taping, Dry Needling, Joint mobilization, Spinal mobilization, Scar mobilization, Compression bandaging, Vestibular training, Visual/preceptual remediation/compensation, Cognitive remediation, DME instructions, Cryotherapy, Moist heat, and Biofeedback  PLAN FOR  NEXT SESSION:  Blaze Pods, Korebalance balance, strength, L ankle strength, stairs, LE endurance   Lonni KATHEE Gainer PT ,DPT Physical Therapist- [place]  [facility]  09/06/2024, 11:16 AM   "

## 2024-09-08 ENCOUNTER — Ambulatory Visit

## 2024-09-09 ENCOUNTER — Other Ambulatory Visit: Payer: Self-pay | Admitting: Internal Medicine

## 2024-09-10 ENCOUNTER — Ambulatory Visit: Admitting: Internal Medicine

## 2024-09-10 ENCOUNTER — Encounter: Payer: Self-pay | Admitting: Internal Medicine

## 2024-09-10 VITALS — BP 126/72 | HR 56 | Temp 97.9°F | Ht 72.0 in | Wt 262.0 lb

## 2024-09-10 DIAGNOSIS — I4891 Unspecified atrial fibrillation: Secondary | ICD-10-CM

## 2024-09-10 DIAGNOSIS — G629 Polyneuropathy, unspecified: Secondary | ICD-10-CM

## 2024-09-10 DIAGNOSIS — I2510 Atherosclerotic heart disease of native coronary artery without angina pectoris: Secondary | ICD-10-CM

## 2024-09-10 DIAGNOSIS — E1122 Type 2 diabetes mellitus with diabetic chronic kidney disease: Secondary | ICD-10-CM

## 2024-09-10 DIAGNOSIS — E782 Mixed hyperlipidemia: Secondary | ICD-10-CM | POA: Diagnosis not present

## 2024-09-10 DIAGNOSIS — D649 Anemia, unspecified: Secondary | ICD-10-CM

## 2024-09-10 DIAGNOSIS — M7989 Other specified soft tissue disorders: Secondary | ICD-10-CM

## 2024-09-10 DIAGNOSIS — Z8673 Personal history of transient ischemic attack (TIA), and cerebral infarction without residual deficits: Secondary | ICD-10-CM

## 2024-09-10 DIAGNOSIS — Z794 Long term (current) use of insulin: Secondary | ICD-10-CM | POA: Diagnosis not present

## 2024-09-10 DIAGNOSIS — R001 Bradycardia, unspecified: Secondary | ICD-10-CM

## 2024-09-10 DIAGNOSIS — Z992 Dependence on renal dialysis: Secondary | ICD-10-CM | POA: Diagnosis not present

## 2024-09-10 DIAGNOSIS — Z9861 Coronary angioplasty status: Secondary | ICD-10-CM

## 2024-09-10 DIAGNOSIS — I120 Hypertensive chronic kidney disease with stage 5 chronic kidney disease or end stage renal disease: Secondary | ICD-10-CM | POA: Diagnosis not present

## 2024-09-10 DIAGNOSIS — I779 Disorder of arteries and arterioles, unspecified: Secondary | ICD-10-CM

## 2024-09-10 DIAGNOSIS — E039 Hypothyroidism, unspecified: Secondary | ICD-10-CM | POA: Diagnosis not present

## 2024-09-10 DIAGNOSIS — Z125 Encounter for screening for malignant neoplasm of prostate: Secondary | ICD-10-CM | POA: Diagnosis not present

## 2024-09-10 DIAGNOSIS — N186 End stage renal disease: Secondary | ICD-10-CM | POA: Diagnosis not present

## 2024-09-10 DIAGNOSIS — I10 Essential (primary) hypertension: Secondary | ICD-10-CM

## 2024-09-10 DIAGNOSIS — E08319 Diabetes mellitus due to underlying condition with unspecified diabetic retinopathy without macular edema: Secondary | ICD-10-CM

## 2024-09-10 LAB — LIPID PANEL
Cholesterol: 120 mg/dL (ref 28–200)
HDL: 28.1 mg/dL — ABNORMAL LOW
LDL Cholesterol: 66 mg/dL (ref 10–99)
NonHDL: 91.42
Total CHOL/HDL Ratio: 4
Triglycerides: 128 mg/dL (ref 10.0–149.0)
VLDL: 25.6 mg/dL (ref 0.0–40.0)

## 2024-09-10 LAB — HEPATIC FUNCTION PANEL
ALT: 8 U/L (ref 3–53)
AST: 9 U/L (ref 5–37)
Albumin: 3.9 g/dL (ref 3.5–5.2)
Alkaline Phosphatase: 63 U/L (ref 39–117)
Bilirubin, Direct: 0.1 mg/dL (ref 0.1–0.3)
Total Bilirubin: 0.4 mg/dL (ref 0.2–1.2)
Total Protein: 6.7 g/dL (ref 6.0–8.3)

## 2024-09-10 LAB — PSA, MEDICARE: PSA: 0.62 ng/mL (ref 0.10–4.00)

## 2024-09-10 LAB — HEMOGLOBIN A1C: Hgb A1c MFr Bld: 6.5 % (ref 4.6–6.5)

## 2024-09-10 LAB — TSH: TSH: 2.7 u[IU]/mL (ref 0.35–5.50)
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
## 2024-09-11 ENCOUNTER — Ambulatory Visit: Payer: Self-pay | Admitting: Internal Medicine

## 2024-09-13 ENCOUNTER — Encounter: Payer: Self-pay | Admitting: Internal Medicine

## 2024-09-13 ENCOUNTER — Ambulatory Visit

## 2024-09-13 DIAGNOSIS — R2689 Other abnormalities of gait and mobility: Secondary | ICD-10-CM

## 2024-09-13 DIAGNOSIS — R262 Difficulty in walking, not elsewhere classified: Secondary | ICD-10-CM

## 2024-09-13 DIAGNOSIS — R2681 Unsteadiness on feet: Secondary | ICD-10-CM

## 2024-09-13 DIAGNOSIS — M6281 Muscle weakness (generalized): Secondary | ICD-10-CM

## 2024-09-13 MED ORDER — CLOPIDOGREL BISULFATE 75 MG PO TABS: 75.0000 mg | ORAL_TABLET | Freq: Every day | ORAL | 1 refills | Status: AC

## 2024-09-13 MED ORDER — HYDRALAZINE HCL 25 MG PO TABS: 25.0000 mg | ORAL_TABLET | Freq: Two times a day (BID) | ORAL | 1 refills | Status: AC

## 2024-09-13 MED ORDER — LEVOTHYROXINE SODIUM 200 MCG PO TABS: ORAL_TABLET | ORAL | 1 refills | Status: AC

## 2024-09-13 MED ORDER — APIXABAN 5 MG PO TABS: 5.0000 mg | ORAL_TABLET | Freq: Two times a day (BID) | ORAL | 1 refills | Status: AC

## 2024-09-13 MED ORDER — METOPROLOL TARTRATE 37.5 MG PO TABS: 37.5000 mg | ORAL_TABLET | Freq: Two times a day (BID) | ORAL | 1 refills | Status: AC

## 2024-09-13 MED ORDER — PREGABALIN 100 MG PO CAPS: 100.0000 mg | ORAL_CAPSULE | Freq: Every evening | ORAL | 2 refills | Status: AC

## 2024-09-13 MED ORDER — FUROSEMIDE 80 MG PO TABS: 80.0000 mg | ORAL_TABLET | Freq: Every day | ORAL | 1 refills | Status: AC

## 2024-09-13 NOTE — Assessment & Plan Note (Signed)
 Is followed through dialysis. Obtain labs.

## 2024-09-13 NOTE — Assessment & Plan Note (Signed)
 Saw cardiology 08/25/24 - aspirin  changed to plavix . Continues on eliquis . Recommended 2 week monitor and echo. Overall stable.

## 2024-09-13 NOTE — Assessment & Plan Note (Signed)
 Saw cardiology 08/25/24 - aspirin  changed to plavix . Continues on eliquis . Recommended 2 week monitor and echo.

## 2024-09-13 NOTE — Assessment & Plan Note (Signed)
 Needs to continue f/u with ophthalmology.

## 2024-09-13 NOTE — Assessment & Plan Note (Signed)
 Continue lyrica

## 2024-09-13 NOTE — Therapy (Signed)
 " OUTPATIENT PHYSICAL THERAPY NEURO TREATMENT     Patient Name: Gregory Crane MRN: [id number] DOB:1970-09-21, 54 y.o., male Today's Date: 09/13/2024  PCP: Glendia Shad  REFERRING PROVIDER: Glendia Shad  END OF SESSION:  PT End of Session - 09/13/24 1100     Visit Number 41    Number of Visits 63    Date for Recertification  11/15/24    Progress Note Due on Visit 20    PT Start Time 1100    PT Stop Time 1143    PT Time Calculation (min) 43 min    Equipment Utilized During Treatment Gait belt    Activity Tolerance Patient tolerated treatment well    Behavior During Therapy [facility] for tasks assessed/performed              Past Medical History:  Diagnosis Date   Allergy    Anemia    Bell's palsy    Diabetes mellitus without complication (HCC)    diet controlled   Fall 07/2023   Hypertension    Hypothyroidism    Kidney stones    Pseudotumor cerebri    Stroke [facility])    Past Surgical History:  Procedure Laterality Date   COLONOSCOPY WITH PROPOFOL  N/A 03/30/2020   Procedure: COLONOSCOPY WITH PROPOFOL ;  Surgeon: Maryruth Ole DASEN, MD;  Location: [facility] ENDOSCOPY;  Service: Endoscopy;  Laterality: N/A;   CORONARY STENT INTERVENTION N/A 03/17/2024   Procedure: CORONARY STENT INTERVENTION;  Surgeon: Jordan, Peter M, MD;  Location: [facility] INVASIVE CV LAB;  Service: Cardiovascular;  Laterality: N/A;   LEFT HEART CATH AND CORONARY ANGIOGRAPHY N/A 03/17/2024   Procedure: LEFT HEART CATH AND CORONARY ANGIOGRAPHY;  Surgeon: Jordan, Peter M, MD;  Location: [facility] INVASIVE CV LAB;  Service: Cardiovascular;  Laterality: N/A;   LOOP RECORDER INSERTION N/A 01/27/2018   Procedure: LOOP RECORDER INSERTION;  Surgeon: Fernande Elspeth BROCKS, MD;  Location: [facility] INVASIVE CV LAB;  Service: Cardiovascular;  Laterality: N/A;   LUMBAR PUNCTURE     as child   NO PAST SURGERIES     REMOVAL OF A DIALYSIS CATHETER     TEE WITHOUT CARDIOVERSION N/A 01/07/2018   Procedure: TRANSESOPHAGEAL ECHOCARDIOGRAM  (TEE);  Surgeon: Perla Evalene PARAS, MD;  Location: [facility] ORS;  Service: Cardiovascular;  Laterality: N/A;   Patient Active Problem List   Diagnosis Date Noted   Bradycardia 04/30/2024   CAD S/P percutaneous coronary angioplasty 03/17/2024   Non-ST elevation (NSTEMI) myocardial infarction (HCC) 03/16/2024   Hypervolemia 03/14/2024   Hypoxic respiratory failure (HCC) 03/14/2024   Left hip pain 01/30/2024   SVT (supraventricular tachycardia) 08/10/2023   Atrial fibrillation (HCC) 07/03/2023   Atrial fibrillation with RVR (HCC) 07/02/2023   Fall 06/15/2023   Non-compliance with renal dialysis 05/02/2023   Volume overload 04/29/2023   Renal osteodystrophy 11/25/2022   Unsteady gait 10/05/2022   Steal syndrome of dialysis vascular access 05/28/2022   Hydronephrosis, left 05/04/2022   Leukocytosis 05/04/2022   Diabetic retinopathy associated with diabetes mellitus due to underlying condition (HCC) 01/13/2022   ESRD on hemodialysis (HCC) 01/13/2022   Deafness in right ear 08/11/2021   History of colon polyps 10/15/2020   Type 2 diabetes mellitus, with long-term current use of insulin  (HCC) 09/09/2020   Cryptogenic stroke (HCC) 07/13/2020   History of loop recorder 07/13/2020   Lymphedema 06/08/2019   Anemia 04/17/2019   Swelling of both lower extremities 01/10/2019   Facial droop 04/09/2018   Daytime somnolence 03/30/2018   Carotid artery disease 02/03/2018  Intracranial vascular stenosis 09/12/2017   History of CVA (cerebrovascular accident) 11/10/2016   Benign localized hyperplasia of prostate with urinary obstruction 10/27/2016   History of nephrolithiasis 10/27/2016   TIA (transient ischemic attack) 10/20/2016   Near syncope 06/23/2016   Organic impotence 10/01/2015   Neuropathy 08/06/2015   Health care maintenance 08/06/2015   Hypertension 08/06/2015   Hypothyroidism 10/10/2013   Microalbuminuria 10/10/2013   Hyperlipidemia 10/10/2013   B12 deficiency 10/10/2013    Environmental allergies 07/04/2013    ONSET DATE: 3 years ago  REFERRING DIAG: unsteady gait  THERAPY DIAG:  Muscle weakness (generalized)  Unsteadiness on feet  Other abnormalities of gait and mobility  Difficulty in walking, not elsewhere classified  Rationale for Evaluation and Treatment: Rehabilitation  SUBJECTIVE:                                                                                                                                                                                             SUBJECTIVE STATEMENT:   Patient's grandkids were visiting yesterday.   Pt accompanied by: self  PERTINENT HISTORY: Patient returning to PT s/p hospitalization for pneumonia, , SVT, ESRD on hypodialysis. PMH DM, HTN, Anemia, Bells palsy, Hypothyroidism, Kidney stones, stroke and pseudotumour cerebri. Macular degeneration with impaired visual acuity   PAIN:  Are you having pain? Some L foot numbness/neuropathy  PRECAUTIONS: Fall  RED FLAGS: None   WEIGHT BEARING RESTRICTIONS: No  FALLS: Has patient fallen in last 6 months? No  LIVING ENVIRONMENT: Lives with: lives alone Lives in: House/apartment Stairs: yes stairs in house Has following equipment at home: Single point cane  PLOF: Independent with basic ADLs  PATIENT GOALS: balance, strength  OBJECTIVE:  Note: Objective measures were completed at Evaluation unless otherwise noted.  DIAGNOSTIC FINDINGS: IMPRESSION: Interval resolution of previously noted trace left parafalcine subdural hematoma. No acute intracranial process.  COGNITION: Overall cognitive status: Within functional limits for tasks assessed   SENSATION: WFL  COORDINATION: Heel slide: WFL RLE; slight decrease with LLE   MUSCLE TONE: WFL  POSTURE: rounded shoulders, forward head, and posterior pelvic tilt  LOWER EXTREMITY MMT:    MMT Right Eval Left Eval  Hip flexion 4 4  Hip extension    Hip abduction 3 3  Hip adduction 3+ 3+   Hip internal rotation    Hip external rotation    Knee flexion 4- 4-  Knee extension 4 4  Ankle dorsiflexion 3 2+  Ankle plantarflexion 3+ 3  Ankle inversion    Ankle eversion    (Blank rows = not tested)  BED MOBILITY:  Not tested  TRANSFERS: Sit to stand:  CGA  Assistive device utilized: None     Stand to sit: CGA  Assistive device utilized: None     Chair to chair: CGA  Assistive device utilized: None       RAMP:  Not tested  CURB:  Findings: needs UE support to step up  STAIRS: heavy BUE support step over step negotiation.  GAIT: Findings: Gait Characteristics: decreased stride length, Left foot flat, wide BOS, and poor foot clearance- Left, Distance walked: 60 ft, Assistive device utilized:None, Level of assistance: CGA, and Comments: increased L ankle rolling due to not wearing AFO  FUNCTIONAL TESTS:  5 times sit to stand: 30.53 with 4 posterior LOB 6 minute walk test: perform next session  Berg Balance Scale: 26  PATIENT SURVEYS:  LEFS give next session                                                                                                                               TREATMENT DATE: 09/13/24 TherAct: To improve functional movements patterns for everyday tasks In // bars:  -ambulate with feet on either side of 2x4 for improved BOS forward/backwards 8x length.   -heel raise 15x  -toy soldier 6x length  -large step clap and then step back, use of // bars for step back    Sit to stand overhead press with PVC pipe 10x ; airex pad under bottom to raise height of chair  Seated:heel toe raises 20x    NMR: To facilitate reeducation of movement, balance, posture, coordination, and/or proprioception/kinesthetic sense.  In // bars: Airex balance beam: -lateral step 8x length of bar; occasional posterior LOB  -static stand 60 seconds -overhead press with PVC pipe 10x, row 10x    PATIENT EDUCATION: Education details: goals, POC, HEP Person educated:  Patient Education method: Explanation, Demonstration, Tactile cues, Verbal cues, and Handouts Education comprehension: verbalized understanding, returned demonstration, verbal cues required, tactile cues required, and needs further education  HOME EXERCISE PROGRAM: Access Code: [email or web address] URL: [email or web address] Date: 05/26/2024 Prepared by: Sidra Simpers  Exercises - Leg Extension  - 1 x daily - 7 x weekly - 2 sets - 10 reps - 5 hold - Seated March  - 1 x daily - 7 x weekly - 2 sets - 10 reps - 5 hold - Seated Heel Raise  - 1 x daily - 7 x weekly - 2 sets - 10 reps - 5 hold - Sit to Stand  - 1 x daily - 7 x weekly - 3 sets - 10 reps - Mini Squat with Counter Support  - 1 x daily - 7 x weekly - 3 sets - 10 reps - Standing 4-Way Leg Reach with Counter Support  - 1 x daily - 7 x weekly - 3 sets - 10 reps  GOALS: Goals reviewed with patient? Yes  SHORT TERM GOALS: Target date: 01/26/2024  Patient will be independent in home exercise program to improve strength/mobility for better functional  independence with ADLs.  Baseline: 12/29: HEP compliant Goal status: MET  LONG TERM GOALS: Target date 11/15/2024   Patient (< 61 years old) will complete five times sit to stand test in < 10 seconds indicating an increased LE strength and improved balance.  Baseline: 30.53 with 4 posterior LOB 03/29/24: 22.61 with 3 posterior LOB 06/21/24: 21.7 with 2 posterior LOB 12/29: 20.12 seconds with 1 posterior LOB  Goal status: PROGRESSING  2.   Patient will increase Berg Balance score by >45/56 to demonstrate decreased fall risk during functional activities.  Baseline: 5/5: 26  03/29/24: 32 12/29: 45 Goal status: Partially Met   3.    Patient will increase six minute walk test distance to >1200 for progression to age norm community ambulator and improve gait ability  Baseline: perform next session 5/12: 710 ft  03/29/24: 770' 06/18/24: 792' 12/29: 620 ft with stop at 5 minutes due to two  consecutive LOB's  Goal status: PROGRESSING  4.  Patient will score > 60/80 on LEFS for improved functional mobility and quality of life.  Baseline: perform next session 5/12: 43 03/29/24: 39/80 06/21/24: 38/80 12/29: 35/80 Goal status: Ongoing    ASSESSMENT:  CLINICAL IMPRESSION:   Patient has posterior LOB with unstable surfaces that improves with cues for core activation. Patient asked to inform nurse at dialysis tomorrow that his L side has been swelling. Patient is challenged with sit to stand from low surface. Pt will continue to benefit from skilled therapy to address remaining deficits in order to improve overall QoL and return to PLOF.         OBJECTIVE IMPAIRMENTS: Abnormal gait, cardiopulmonary status limiting activity, decreased activity tolerance, decreased balance, decreased coordination, decreased endurance, decreased knowledge of use of DME, decreased mobility, difficulty walking, decreased ROM, decreased strength, dizziness, impaired perceived functional ability, impaired flexibility, impaired vision/preception, improper body mechanics, postural dysfunction, and obesity.   ACTIVITY LIMITATIONS: carrying, lifting, bending, sitting, standing, squatting, stairs, transfers, bed mobility, toileting, dressing, reach over head, hygiene/grooming, locomotion level, and caring for others  PARTICIPATION LIMITATIONS: meal prep, cleaning, laundry, driving, shopping, community activity, occupation, and yard work  PERSONAL FACTORS: Age, Behavior pattern, Fitness, Past/current experiences, Time since onset of injury/illness/exacerbation, Transportation, and 3+ comorbidities: DM, HTN, Anemia, Bells palsy, Hypothyroidism, Kidney stones, stroke and pseudotumour cerebri. Macular degeneration with impaired visual acuity  are also affecting patient's functional outcome.   REHAB POTENTIAL: Good  CLINICAL DECISION MAKING: Evolving/moderate complexity  EVALUATION COMPLEXITY:  Moderate  PLAN:  PT FREQUENCY: 2x/week  PT DURATION: 12 weeks  PLANNED INTERVENTIONS: 97164- PT Re-evaluation, 97750- Physical Performance Testing, 97110-Therapeutic exercises, 97530- Therapeutic activity, [id number]- Neuromuscular re-education, 97535- Self Care, [id number]- Manual therapy, [id number]- Gait training, [phone]- Orthotic Initial, [phone]- Orthotic/Prosthetic subsequent, [phone]- Canalith repositioning, [id number]- Splinting, H9716- Electrical stimulation (unattended), [phone]- Electrical stimulation (manual), [id number]- Vasopneumatic device, [id number]- Ultrasound, [id number]- Traction (mechanical), [id number]- Ionotophoresis 4mg /ml Dexamethasone , Patient/Family education, Balance training, Stair training, Taping, Dry Needling, Joint mobilization, Spinal mobilization, Scar mobilization, Compression bandaging, Vestibular training, Visual/preceptual remediation/compensation, Cognitive remediation, DME instructions, Cryotherapy, Moist heat, and Biofeedback  PLAN FOR NEXT SESSION:  Blaze Pods, Korebalance balance, strength, L ankle strength, stairs, LE endurance   Eduin Friedel  Leopoldo KLEIN ,DPT Physical Therapist- [place]  [facility]  09/13/24, 11:43 AM   "

## 2024-09-13 NOTE — Assessment & Plan Note (Signed)
Continue crestor. Check lipid panel today.

## 2024-09-13 NOTE — Assessment & Plan Note (Addendum)
 History of CVA with previous left side weakness. Per review, on eliquis . Continue blood pressure control. Continue eliquis . Cardiology recommended change aspriin to plavix . Follow.

## 2024-09-13 NOTE — Assessment & Plan Note (Signed)
 Blood pressure as outlined. Currently on cardizem , hydralazine  and metoprolol . Follow pressures. Metabolic panel followed by nephrology. Obtain lab results.

## 2024-09-13 NOTE — Assessment & Plan Note (Signed)
 Taking synthroid  daily now. Check tsh today.

## 2024-09-13 NOTE — Assessment & Plan Note (Signed)
 Followed by nephrology. Continues on dialysis. He is agreeable to dialysis two days per week. They are following kidney function and electrolytes. Continue f/u with nephrology.

## 2024-09-13 NOTE — Assessment & Plan Note (Signed)
Improved.  Follow.  

## 2024-09-13 NOTE — Assessment & Plan Note (Signed)
 Last carotid ultrasound - <50% bilaterally. Continue crestor . Blood pressure as outlined. Currently on plavix  and eliquis .

## 2024-09-13 NOTE — Assessment & Plan Note (Signed)
 Low carb diet and exercise as tolerated. Check A1c today.

## 2024-09-14 NOTE — Therapy (Incomplete)
 " OUTPATIENT PHYSICAL THERAPY NEURO TREATMENT     Patient Name: Gregory Crane MRN: [id number] DOB:[birth year], 54 y.o., male Today's Date: 09/14/2024  PCP: Glendia Shad  REFERRING PROVIDER: Glendia Shad  END OF SESSION:        Past Medical History:  Diagnosis Date   Allergy    Anemia    Bell's palsy    Diabetes mellitus without complication (HCC)    diet controlled   Fall 07/2023   Hypertension    Hypothyroidism    Kidney stones    Pseudotumor cerebri    Stroke [facility])    Past Surgical History:  Procedure Laterality Date   COLONOSCOPY WITH PROPOFOL  N/A 03/30/2020   Procedure: COLONOSCOPY WITH PROPOFOL ;  Surgeon: Maryruth Ole DASEN, MD;  Location: [facility] ENDOSCOPY;  Service: Endoscopy;  Laterality: N/A;   CORONARY STENT INTERVENTION N/A 03/17/2024   Procedure: CORONARY STENT INTERVENTION;  Surgeon: Jordan, Peter M, MD;  Location: [facility] INVASIVE CV LAB;  Service: Cardiovascular;  Laterality: N/A;   LEFT HEART CATH AND CORONARY ANGIOGRAPHY N/A 03/17/2024   Procedure: LEFT HEART CATH AND CORONARY ANGIOGRAPHY;  Surgeon: Jordan, Peter M, MD;  Location: [facility] INVASIVE CV LAB;  Service: Cardiovascular;  Laterality: N/A;   LOOP RECORDER INSERTION N/A 01/27/2018   Procedure: LOOP RECORDER INSERTION;  Surgeon: Fernande Elspeth BROCKS, MD;  Location: [facility] INVASIVE CV LAB;  Service: Cardiovascular;  Laterality: N/A;   LUMBAR PUNCTURE     as child   NO PAST SURGERIES     REMOVAL OF A DIALYSIS CATHETER     TEE WITHOUT CARDIOVERSION N/A 01/07/2018   Procedure: TRANSESOPHAGEAL ECHOCARDIOGRAM (TEE);  Surgeon: Perla Evalene PARAS, MD;  Location: [facility] ORS;  Service: Cardiovascular;  Laterality: N/A;   Patient Active Problem List   Diagnosis Date Noted   Bradycardia 04/30/2024   CAD S/P percutaneous coronary angioplasty 03/17/2024   Non-ST elevation (NSTEMI) myocardial infarction (HCC) 03/16/2024   Hypervolemia 03/14/2024   Hypoxic respiratory failure (HCC) 03/14/2024   Left hip pain 01/30/2024    SVT (supraventricular tachycardia) 08/10/2023   Atrial fibrillation (HCC) 07/03/2023   Atrial fibrillation with RVR (HCC) 07/02/2023   Fall 06/15/2023   Non-compliance with renal dialysis 05/02/2023   Volume overload 04/29/2023   Renal osteodystrophy 11/25/2022   Unsteady gait 10/05/2022   Steal syndrome of dialysis vascular access 05/28/2022   Hydronephrosis, left 05/04/2022   Leukocytosis 05/04/2022   Diabetic retinopathy associated with diabetes mellitus due to underlying condition (HCC) 01/13/2022   ESRD on hemodialysis (HCC) 01/13/2022   Deafness in right ear 08/11/2021   History of colon polyps 10/15/2020   Type 2 diabetes mellitus, with long-term current use of insulin  (HCC) 09/09/2020   Cryptogenic stroke (HCC) 07/13/2020   History of loop recorder 07/13/2020   Lymphedema 06/08/2019   Anemia 04/17/2019   Swelling of both lower extremities 01/10/2019   Facial droop 04/09/2018   Daytime somnolence 03/30/2018   Carotid artery disease 02/03/2018   Intracranial vascular stenosis 09/12/2017   History of CVA (cerebrovascular accident) 11/10/2016   Benign localized hyperplasia of prostate with urinary obstruction 10/27/2016   History of nephrolithiasis 10/27/2016   TIA (transient ischemic attack) 10/20/2016   Near syncope 06/23/2016   Organic impotence 10/01/2015   Neuropathy 08/06/2015   Health care maintenance 08/06/2015   Hypertension 08/06/2015   Hypothyroidism 10/10/2013   Microalbuminuria 10/10/2013   Hyperlipidemia 10/10/2013   B12 deficiency 10/10/2013   Environmental allergies 07/04/2013    ONSET DATE: 3 years ago  REFERRING DIAG: unsteady gait  THERAPY DIAG:  No diagnosis found.  Rationale for Evaluation and Treatment: Rehabilitation  SUBJECTIVE:                                                                                                                                                                                             SUBJECTIVE STATEMENT:    ***  Pt accompanied by: self  PERTINENT HISTORY: Patient returning to PT s/p hospitalization for pneumonia, , SVT, ESRD on hypodialysis. PMH DM, HTN, Anemia, Bells palsy, Hypothyroidism, Kidney stones, stroke and pseudotumour cerebri. Macular degeneration with impaired visual acuity   PAIN:  Are you having pain? Some L foot numbness/neuropathy  PRECAUTIONS: Fall  RED FLAGS: None   WEIGHT BEARING RESTRICTIONS: No  FALLS: Has patient fallen in last 6 months? No  LIVING ENVIRONMENT: Lives with: lives alone Lives in: House/apartment Stairs: yes stairs in house Has following equipment at home: Single point cane  PLOF: Independent with basic ADLs  PATIENT GOALS: balance, strength  OBJECTIVE:  Note: Objective measures were completed at Evaluation unless otherwise noted.  DIAGNOSTIC FINDINGS: IMPRESSION: Interval resolution of previously noted trace left parafalcine subdural hematoma. No acute intracranial process.  COGNITION: Overall cognitive status: Within functional limits for tasks assessed   SENSATION: WFL  COORDINATION: Heel slide: WFL RLE; slight decrease with LLE   MUSCLE TONE: WFL  POSTURE: rounded shoulders, forward head, and posterior pelvic tilt  LOWER EXTREMITY MMT:    MMT Right Eval Left Eval  Hip flexion 4 4  Hip extension    Hip abduction 3 3  Hip adduction 3+ 3+  Hip internal rotation    Hip external rotation    Knee flexion 4- 4-  Knee extension 4 4  Ankle dorsiflexion 3 2+  Ankle plantarflexion 3+ 3  Ankle inversion    Ankle eversion    (Blank rows = not tested)  BED MOBILITY:  Not tested  TRANSFERS: Sit to stand: CGA  Assistive device utilized: None     Stand to sit: CGA  Assistive device utilized: None     Chair to chair: CGA  Assistive device utilized: None       RAMP:  Not tested  CURB:  Findings: needs UE support to step up  STAIRS: heavy BUE support step over step negotiation.  GAIT: Findings: Gait  Characteristics: decreased stride length, Left foot flat, wide BOS, and poor foot clearance- Left, Distance walked: 60 ft, Assistive device utilized:None, Level of assistance: CGA, and Comments: increased L ankle rolling due to not wearing AFO  FUNCTIONAL TESTS:  5 times sit to stand: 30.53 with 4 posterior LOB 6 minute walk test:  perform next session  Berg Balance Scale: 26  PATIENT SURVEYS:  LEFS give next session                                                                                                                               TREATMENT DATE: 09/14/24 TherAct: To improve functional movements patterns for everyday tasks In // bars:  -ambulate with feet on either side of 2x4 for improved BOS forward/backwards 8x length.   -heel raise 15x  -toy soldier 6x length  -large step clap and then step back, use of // bars for step back    Sit to stand overhead press with PVC pipe 10x ; airex pad under bottom to raise height of chair  Seated:heel toe raises 20x    NMR: To facilitate reeducation of movement, balance, posture, coordination, and/or proprioception/kinesthetic sense.  In // bars: Airex balance beam: -lateral step 8x length of bar; occasional posterior LOB  -static stand 60 seconds -overhead press with PVC pipe 10x, row 10x    PATIENT EDUCATION: Education details: goals, POC, HEP Person educated: Patient Education method: Explanation, Demonstration, Tactile cues, Verbal cues, and Handouts Education comprehension: verbalized understanding, returned demonstration, verbal cues required, tactile cues required, and needs further education  HOME EXERCISE PROGRAM: Access Code: [email or web address] URL: [email or web address] Date: 05/26/2024 Prepared by: Sidra Simpers  Exercises - Leg Extension  - 1 x daily - 7 x weekly - 2 sets - 10 reps - 5 hold - Seated March  - 1 x daily - 7 x weekly - 2 sets - 10 reps - 5 hold - Seated Heel Raise  - 1 x daily - 7 x weekly - 2  sets - 10 reps - 5 hold - Sit to Stand  - 1 x daily - 7 x weekly - 3 sets - 10 reps - Mini Squat with Counter Support  - 1 x daily - 7 x weekly - 3 sets - 10 reps - Standing 4-Way Leg Reach with Counter Support  - 1 x daily - 7 x weekly - 3 sets - 10 reps  GOALS: Goals reviewed with patient? Yes  SHORT TERM GOALS: Target date: 01/26/2024  Patient will be independent in home exercise program to improve strength/mobility for better functional independence with ADLs.  Baseline: 12/29: HEP compliant Goal status: MET  LONG TERM GOALS: Target date 11/15/2024   Patient (< 57 years old) will complete five times sit to stand test in < 10 seconds indicating an increased LE strength and improved balance.  Baseline: 30.53 with 4 posterior LOB 03/29/24: 22.61 with 3 posterior LOB 06/21/24: 21.7 with 2 posterior LOB 12/29: 20.12 seconds with 1 posterior LOB  Goal status: PROGRESSING  2.   Patient will increase Berg Balance score by >45/56 to demonstrate decreased fall risk during functional activities.  Baseline: 5/5: 26  03/29/24: 32 12/29: 45 Goal status: Partially Met   3.    Patient will increase six minute  walk test distance to >1200 for progression to age norm community ambulator and improve gait ability  Baseline: perform next session 5/12: 710 ft  03/29/24: 770' 06/18/24: 792' 12/29: 620 ft with stop at 5 minutes due to two consecutive LOB's  Goal status: PROGRESSING  4.  Patient will score > 60/80 on LEFS for improved functional mobility and quality of life.  Baseline: perform next session 5/12: 43 03/29/24: 39/80 06/21/24: 38/80 12/29: 35/80 Goal status: Ongoing    ASSESSMENT:  CLINICAL IMPRESSION:   ***  Pt will continue to benefit from skilled therapy to address remaining deficits in order to improve overall QoL and return to PLOF.         OBJECTIVE IMPAIRMENTS: Abnormal gait, cardiopulmonary status limiting activity, decreased activity tolerance, decreased balance,  decreased coordination, decreased endurance, decreased knowledge of use of DME, decreased mobility, difficulty walking, decreased ROM, decreased strength, dizziness, impaired perceived functional ability, impaired flexibility, impaired vision/preception, improper body mechanics, postural dysfunction, and obesity.   ACTIVITY LIMITATIONS: carrying, lifting, bending, sitting, standing, squatting, stairs, transfers, bed mobility, toileting, dressing, reach over head, hygiene/grooming, locomotion level, and caring for others  PARTICIPATION LIMITATIONS: meal prep, cleaning, laundry, driving, shopping, community activity, occupation, and yard work  PERSONAL FACTORS: Age, Behavior pattern, Fitness, Past/current experiences, Time since onset of injury/illness/exacerbation, Transportation, and 3+ comorbidities: DM, HTN, Anemia, Bells palsy, Hypothyroidism, Kidney stones, stroke and pseudotumour cerebri. Macular degeneration with impaired visual acuity  are also affecting patient's functional outcome.   REHAB POTENTIAL: Good  CLINICAL DECISION MAKING: Evolving/moderate complexity  EVALUATION COMPLEXITY: Moderate  PLAN:  PT FREQUENCY: 2x/week  PT DURATION: 12 weeks  PLANNED INTERVENTIONS: 97164- PT Re-evaluation, 97750- Physical Performance Testing, 97110-Therapeutic exercises, 97530- Therapeutic activity, [id number]- Neuromuscular re-education, 97535- Self Care, 02859- Manual therapy, [id number]- Gait training, [id number]- Orthotic Initial, [phone]- Orthotic/Prosthetic subsequent, [phone]- Canalith repositioning, [id number]- Splinting, H9716- Electrical stimulation (unattended), [phone]- Electrical stimulation (manual), [id number]- Vasopneumatic device, [id number]- Ultrasound, [id number]- Traction (mechanical), [id number]- Ionotophoresis 4mg /ml Dexamethasone , Patient/Family education, Balance training, Stair training, Taping, Dry Needling, Joint mobilization, Spinal mobilization, Scar mobilization, Compression bandaging, Vestibular training,  Visual/preceptual remediation/compensation, Cognitive remediation, DME instructions, Cryotherapy, Moist heat, and Biofeedback  PLAN FOR NEXT SESSION:  Blaze Pods, Korebalance balance, strength, L ankle strength, stairs, LE endurance   Kinza Gouveia  Leopoldo KLEIN ,DPT Physical Therapist- [place]  [facility]  09/14/24, 9:51 AM   "

## 2024-09-15 ENCOUNTER — Ambulatory Visit

## 2024-09-20 ENCOUNTER — Ambulatory Visit

## 2024-09-22 ENCOUNTER — Ambulatory Visit

## 2024-09-22 ENCOUNTER — Emergency Department

## 2024-09-22 ENCOUNTER — Other Ambulatory Visit: Payer: Self-pay

## 2024-09-22 ENCOUNTER — Emergency Department
Admission: EM | Admit: 2024-09-22 | Discharge: 2024-09-22 | Discharge status: Against Medical Advice | Attending: Emergency Medicine | Admitting: Emergency Medicine

## 2024-09-22 DIAGNOSIS — Z5329 Procedure and treatment not carried out because of patient's decision for other reasons: Secondary | ICD-10-CM | POA: Insufficient documentation

## 2024-09-22 DIAGNOSIS — R2681 Unsteadiness on feet: Secondary | ICD-10-CM

## 2024-09-22 DIAGNOSIS — I10 Essential (primary) hypertension: Secondary | ICD-10-CM | POA: Insufficient documentation

## 2024-09-22 DIAGNOSIS — Z992 Dependence on renal dialysis: Secondary | ICD-10-CM | POA: Insufficient documentation

## 2024-09-22 DIAGNOSIS — R2689 Other abnormalities of gait and mobility: Secondary | ICD-10-CM

## 2024-09-22 DIAGNOSIS — R519 Headache, unspecified: Secondary | ICD-10-CM | POA: Insufficient documentation

## 2024-09-22 DIAGNOSIS — M6281 Muscle weakness (generalized): Secondary | ICD-10-CM

## 2024-09-22 DIAGNOSIS — Y900 Blood alcohol level of less than 20 mg/100 ml: Secondary | ICD-10-CM | POA: Insufficient documentation

## 2024-09-22 DIAGNOSIS — R262 Difficulty in walking, not elsewhere classified: Secondary | ICD-10-CM

## 2024-09-22 DIAGNOSIS — E875 Hyperkalemia: Secondary | ICD-10-CM | POA: Insufficient documentation

## 2024-09-22 LAB — COMPREHENSIVE METABOLIC PANEL WITH GFR
ALT: 11 U/L (ref 0–44)
AST: 15 U/L (ref 15–41)
Albumin: 4.3 g/dL (ref 3.5–5.0)
Alkaline Phosphatase: 88 U/L (ref 38–126)
Anion gap: 20 — ABNORMAL HIGH (ref 5–15)
BUN: 106 mg/dL — ABNORMAL HIGH (ref 6–20)
CO2: 19 mmol/L — ABNORMAL LOW (ref 22–32)
Calcium: 7.6 mg/dL — ABNORMAL LOW (ref 8.9–10.3)
Chloride: 104 mmol/L (ref 98–111)
Creatinine, Ser: 12.6 mg/dL — ABNORMAL HIGH (ref 0.61–1.24)
GFR, Estimated: 4 mL/min — ABNORMAL LOW
Glucose, Bld: 124 mg/dL — ABNORMAL HIGH (ref 70–99)
Potassium: 6.8 mmol/L (ref 3.5–5.1)
Sodium: 142 mmol/L (ref 135–145)
Total Bilirubin: 0.4 mg/dL (ref 0.0–1.2)
Total Protein: 7.5 g/dL (ref 6.5–8.1)

## 2024-09-22 LAB — CBC
HCT: 36.1 % — ABNORMAL LOW (ref 39.0–52.0)
Hemoglobin: 11.1 g/dL — ABNORMAL LOW (ref 13.0–17.0)
MCH: 31.4 pg (ref 26.0–34.0)
MCHC: 30.7 g/dL (ref 30.0–36.0)
MCV: 102.3 fL — ABNORMAL HIGH (ref 80.0–100.0)
Platelets: 188 10*3/uL (ref 150–400)
RBC: 3.53 MIL/uL — ABNORMAL LOW (ref 4.22–5.81)
RDW: 13.6 % (ref 11.5–15.5)
WBC: 8 10*3/uL (ref 4.0–10.5)
nRBC: 0 % (ref 0.0–0.2)

## 2024-09-22 LAB — ETHANOL: Alcohol, Ethyl (B): <15 mg/dL

## 2024-09-22 LAB — PROTIME-INR
INR: 1.2 (ref 0.8–1.2)
Prothrombin Time: 16.1 s — ABNORMAL HIGH (ref 11.4–15.2)

## 2024-09-22 LAB — APTT: aPTT: 41 s — ABNORMAL HIGH (ref 24–36)

## 2024-09-22 LAB — CBG MONITORING, ED
Glucose-Capillary: 128 mg/dL — ABNORMAL HIGH (ref 70–99)
Glucose-Capillary: 127 mg/dL — ABNORMAL HIGH (ref 70–99)

## 2024-09-22 MED ORDER — ACETAMINOPHEN 500 MG PO TABS
1000.0000 mg | ORAL_TABLET | Freq: Once | ORAL | Status: AC
  Administered 2024-09-22: 1000 mg via ORAL
  Filled 2024-09-22: qty 2

## 2024-09-22 MED ORDER — INSULIN ASPART 100 UNIT/ML IV SOLN
5.0000 [IU] | Freq: Once | INTRAVENOUS | Status: AC
  Administered 2024-09-22: 5 [IU] via INTRAVENOUS
  Filled 2024-09-22: qty 5

## 2024-09-22 MED ORDER — OXYCODONE HCL 5 MG PO TABS
5.0000 mg | ORAL_TABLET | Freq: Once | ORAL | Status: AC
  Administered 2024-09-22: 5 mg via ORAL
  Filled 2024-09-22: qty 1

## 2024-09-22 MED ORDER — CALCIUM GLUCONATE 10 % IV SOLN
1.0000 g | Freq: Once | INTRAVENOUS | Status: AC
  Administered 2024-09-22: 1 g via INTRAVENOUS
  Filled 2024-09-22: qty 10

## 2024-09-22 MED ORDER — PREGABALIN 50 MG PO CAPS
100.0000 mg | ORAL_CAPSULE | Freq: Once | ORAL | Status: AC
  Administered 2024-09-22: 100 mg via ORAL
  Filled 2024-09-22: qty 2

## 2024-09-22 MED ORDER — FUROSEMIDE 10 MG/ML IJ SOLN
80.0000 mg | Freq: Once | INTRAMUSCULAR | Status: AC
  Administered 2024-09-22: 80 mg via INTRAVENOUS
  Filled 2024-09-22: qty 8

## 2024-09-22 MED ORDER — DEXTROSE 50 % IV SOLN
1.0000 | Freq: Once | INTRAVENOUS | Status: AC
  Administered 2024-09-22: 50 mL via INTRAVENOUS
  Filled 2024-09-22: qty 50

## 2024-09-22 MED ORDER — SODIUM ZIRCONIUM CYCLOSILICATE 10 G PO PACK
10.0000 g | PACK | Freq: Once | ORAL | Status: AC
  Administered 2024-09-22: 10 g via ORAL
  Filled 2024-09-22: qty 1

## 2024-09-22 MED ORDER — PATIROMER SORBITEX CALCIUM 8.4 G PO PACK
25.2000 g | PACK | Freq: Once | ORAL | Status: AC
  Administered 2024-09-22: 25.2 g via ORAL
  Filled 2024-09-22: qty 3

## 2024-09-22 NOTE — ED Provider Notes (Signed)
 Patient is once again asking to be discharged home.  He states he has dialysis scheduled for 6:30 in the morning tomorrow and he wants to leave.  I had a discussion with the patient regarding his significantly elevated potassium discussed that potassium levels were elevated this high can cause the heart to stop beating and result in death.  Patient stated: I understand what you are telling me but I do not feel that is going to happen.  Again discussed with the patient that a potassium elevated at hide he has a high risk of a bad outcome including death.  Patient states a clear understanding of this risk and still wishes to leave.  I discussed with the patient we would need him to sign an AMA form as we are not recommending that he leave the hospital.  Patient states he understands this he is happy to sign the form but wishes to leave.  Will have the patient signed out AGAINST MEDICAL ADVICE.  Patient has at least received Lasix  and Veltassa  as well as Lokelma  during his ER stay.   Dorothyann Drivers, MD 09/22/24 1714

## 2024-09-22 NOTE — ED Provider Notes (Signed)
 "  [facility] Provider Note    Event Date/Time   First MD Initiated Contact with Patient 09/22/24 1342     (approximate)   History   Hypertension   HPI  Gregory Crane is a 54 y.o. male who presents to the ED for evaluation of Hypertension   Patient presents due to 2 days of acute on chronic unsteadiness while ambulating.  He reports a chronic degree of ataxia for which he sees outpatient PT on a regular basis.  He went to a typical session today and a physical therapist who is familiar with him even noticed that his ambulation is off.  No falls or syncope.  Hemodialysis on Tuesdays and Thursdays, could not go yesterday due to weather and reports he was last dialyzed Thursday of last week.   Physical Exam   Triage Vital Signs: ED Triage Vitals  Encounter Vitals Group     BP 09/22/24 1249 (!) 181/82     Girls Systolic BP Percentile --      Girls Diastolic BP Percentile --      Boys Systolic BP Percentile --      Boys Diastolic BP Percentile --      Pulse Rate 09/22/24 1249 (!) 55     Resp 09/22/24 1249 18     Temp 09/22/24 1249 97.8 F (36.6 C)     Temp src --      SpO2 09/22/24 1249 95 %     Weight 09/22/24 1251 260 lb (117.9 kg)     Height 09/22/24 1251 6' (1.829 m)     Head Circumference --      Peak Flow --      Pain Score 09/22/24 1250 0     Pain Loc --      Pain Education --      Exclude from Growth Chart --     Most recent vital signs: Vitals:   09/22/24 1249  BP: (!) 181/82  Pulse: (!) 55  Resp: 18  Temp: 97.8 F (36.6 C)  SpO2: 95%    General: Awake, no distress.  CV:  Good peripheral perfusion.  Resp:  Normal effort.  Abd:  No distention.  MSK:  No deformity noted.  Neuro:  No focal deficits appreciated. Other:     ED Results / Procedures / Treatments   Labs (all labs ordered are listed, but only abnormal results are displayed) Labs Reviewed  PROTIME-INR - Abnormal; Notable for the following components:       Result Value   Prothrombin Time 16.1 (*)    All other components within normal limits  APTT - Abnormal; Notable for the following components:   aPTT 41 (*)    All other components within normal limits  CBC - Abnormal; Notable for the following components:   RBC 3.53 (*)    Hemoglobin 11.1 (*)    HCT 36.1 (*)    MCV 102.3 (*)    All other components within normal limits  COMPREHENSIVE METABOLIC PANEL WITH GFR - Abnormal; Notable for the following components:   Potassium 6.8 (*)    CO2 19 (*)    Glucose, Bld 124 (*)    BUN 106 (*)    Creatinine, Ser 12.60 (*)    Calcium  7.6 (*)    GFR, Estimated 4 (*)    Anion gap 20 (*)    All other components within normal limits  CBG MONITORING, ED - Abnormal; Notable for the following components:  Glucose-Capillary 128 (*)    All other components within normal limits  ETHANOL  DIFFERENTIAL    EKG Sinus rhythm with a rate of 56 bpm, leftward axis, fairly low amplitude with prominent T waves, no clear signs of acute ischemia.  Incomplete right bundle.  RADIOLOGY CT head interpreted by me without evidence of acute intracranial pathology  Official radiology report(s): CT HEAD WO CONTRAST Result Date: 09/22/2024 EXAM: CT HEAD WITHOUT CONTRAST 09/22/2024 01:38:50 PM TECHNIQUE: CT of the head was performed without the administration of intravenous contrast. Automated exposure control, iterative reconstruction, and/or weight based adjustment of the mA/kV was utilized to reduce the radiation dose to as low as reasonably achievable. COMPARISON: Brain MRI 07/31/2024. CLINICAL HISTORY: Neuro deficit, acute, stroke suspected. FINDINGS: BRAIN AND VENTRICLES: No acute hemorrhage. No evidence of acute infarct. Chronic lacunar infarcts within the thalami are again noted bilaterally as well as within the central pons. This appears similar in appearance to the MRI from 07/31/2024. Chronic encephalomalacia within the right basal ganglia and chronic lacunar  infarct within the left lobe of the thalamus are also unchanged. No hydrocephalus. No extra-axial collection. No mass effect or midline shift. ORBITS: No acute abnormality. SINUSES: No acute abnormality. SOFT TISSUES AND SKULL: No acute soft tissue abnormality. No skull fracture. IMPRESSION: 1. No acute intracranial abnormality. 2. Unchanged chronic bilateral lacunar infarcts within the thalami, central pons, right basal ganglia and left globus pallidus. Electronically signed by: Waddell Calk MD 09/22/2024 02:12 PM EST RP Workstation: [id number]    PROCEDURES and INTERVENTIONS:  .Critical Care  Performed by: Claudene Rover, MD Authorized by: Claudene Rover, MD   Critical care provider statement:    Critical care time (minutes):  30   Critical care time was exclusive of:  Separately billable procedures and treating other patients   Critical care was necessary to treat or prevent imminent or life-threatening deterioration of the following conditions:  Metabolic crisis   Critical care was time spent personally by me on the following activities:  Development of treatment plan with patient or surrogate, discussions with consultants, evaluation of patient's response to treatment, examination of patient, ordering and review of laboratory studies, ordering and review of radiographic studies, ordering and performing treatments and interventions, pulse oximetry, re-evaluation of patient's condition and review of old charts .1-3 Lead EKG Interpretation  Performed by: Claudene Rover, MD Authorized by: Claudene Rover, MD     Interpretation: normal     ECG rate:  60   ECG rate assessment: normal     Rhythm: sinus rhythm     Ectopy: none     Conduction: normal     Medications  sodium zirconium cyclosilicate  (LOKELMA ) packet 10 g (has no administration in time range)  patiromer  (VELTASSA ) packet 25.2 g (has no administration in time range)  pregabalin  (LYRICA ) capsule 100 mg (has no administration in time  range)  acetaminophen  (TYLENOL ) tablet 1,000 mg (has no administration in time range)  oxyCODONE  (Oxy IR/ROXICODONE ) immediate release tablet 5 mg (has no administration in time range)  insulin  aspart (novoLOG ) injection 5 Units (5 Units Intravenous Given 09/22/24 1414)    And  dextrose  50 % solution 50 mL (50 mLs Intravenous Given 09/22/24 1412)  calcium  gluconate inj 10% (1 g) URGENT USE ONLY! (1 g Intravenous Given 09/22/24 1406)  furosemide  (LASIX ) injection 80 mg (80 mg Intravenous Given 09/22/24 1416)     IMPRESSION / MDM / ASSESSMENT AND PLAN / ED COURSE  I reviewed the triage vital signs and the nursing  notes.  Differential diagnosis includes, but is not limited to, stroke, metabolic derangement, dehydration, symptomatic anemia  {Patient presents with symptoms of an acute illness or injury that is potentially life-threatening.  Dialysis patient presents with nonfocal balance issues with signs of severe hyperkalemia requiring urgent hemodialysis.  Has missed a recent session due to bad weather.  He otherwise looks well with a normal exam and no other complaints.  Potassium is 6.8 with prominent T waves on EKG.  Chronic normocytic anemia.  Reassuring CT head.  Consult with nephrology who agrees to help facilitate hemodialysis urgently.  Provide temporizing medications, as above.  Patient has a strong desire to not be admitted to the hospital and I have to convince him somewhat to even stay for dialysis and explained the importance of treatment of hyperkalemia.  He is willing to stay for hemodialysis only.  Suspect he will return to the ED after this and require reassessment to ensure improvement of symptoms prior to hopeful discharge  Clinical Course as of 09/22/24 1424  Wed Sep 22, 2024  1401 I consult with Dr. Marcelino, nephro.  [DS]    Clinical Course User Index [DS] Claudene Rover, MD     FINAL CLINICAL IMPRESSION(S) / ED DIAGNOSES   Final diagnoses:  Hyperkalemia     Rx /  DC Orders   ED Discharge Orders     None        Note:  This document was prepared using Dragon voice recognition software and may include unintentional dictation errors.   Claudene Rover, MD 09/22/24 1425  "

## 2024-09-22 NOTE — ED Notes (Signed)
 Pt's blood pressure is 203-86 explained that this was elevated and I was concerned that he might have a stroke due to his elevated blood pressure, pt verbalized understanding, explained to pt that his K of 6.8 was elevated and although we had given him medication to help with this they don't eliminate the problem and he is at risk of going into v tach and having a cardia arrest and dying. Pt states that he understands, but he would still like to go home, states that several people have explained their concerns to him, but he wants to go home and go to his appointment tomorrow morning.  Went over risks with pt again and pt verbalized understanding, pt states that he has a ride coming to get him, pt ambulatory from department.

## 2024-09-22 NOTE — Therapy (Signed)
 " OUTPATIENT PHYSICAL THERAPY NEURO TREATMENT     Patient Name: Gregory Crane MRN: [id number] DOB:[birth year], 54 y.o., male Today's Date: 09/22/2024  PCP: Glendia Shad  REFERRING PROVIDER: Glendia Shad  END OF SESSION:  PT End of Session - 09/22/24 1132     Visit Number 42    Number of Visits 63    Date for Recertification  11/15/24    Progress Note Due on Visit 20    PT Start Time 1140    PT Stop Time 1225    PT Time Calculation (min) 45 min    Equipment Utilized During Treatment Gait belt    Activity Tolerance Patient tolerated treatment well    Behavior During Therapy [facility] for tasks assessed/performed               Past Medical History:  Diagnosis Date   Allergy    Anemia    Bell's palsy    Diabetes mellitus without complication (HCC)    diet controlled   Fall 07/2023   Hypertension    Hypothyroidism    Kidney stones    Pseudotumor cerebri    Stroke [facility])    Past Surgical History:  Procedure Laterality Date   COLONOSCOPY WITH PROPOFOL  N/A 03/30/2020   Procedure: COLONOSCOPY WITH PROPOFOL ;  Surgeon: Maryruth Ole DASEN, MD;  Location: [facility] ENDOSCOPY;  Service: Endoscopy;  Laterality: N/A;   CORONARY STENT INTERVENTION N/A 03/17/2024   Procedure: CORONARY STENT INTERVENTION;  Surgeon: Jordan, Peter M, MD;  Location: [facility] INVASIVE CV LAB;  Service: Cardiovascular;  Laterality: N/A;   LEFT HEART CATH AND CORONARY ANGIOGRAPHY N/A 03/17/2024   Procedure: LEFT HEART CATH AND CORONARY ANGIOGRAPHY;  Surgeon: Jordan, Peter M, MD;  Location: [facility] INVASIVE CV LAB;  Service: Cardiovascular;  Laterality: N/A;   LOOP RECORDER INSERTION N/A 01/27/2018   Procedure: LOOP RECORDER INSERTION;  Surgeon: Fernande Elspeth BROCKS, MD;  Location: [facility] INVASIVE CV LAB;  Service: Cardiovascular;  Laterality: N/A;   LUMBAR PUNCTURE     as child   NO PAST SURGERIES     REMOVAL OF A DIALYSIS CATHETER     TEE WITHOUT CARDIOVERSION N/A 01/07/2018   Procedure: TRANSESOPHAGEAL ECHOCARDIOGRAM  (TEE);  Surgeon: Perla Evalene PARAS, MD;  Location: [facility] ORS;  Service: Cardiovascular;  Laterality: N/A;   Patient Active Problem List   Diagnosis Date Noted   Bradycardia 04/30/2024   CAD S/P percutaneous coronary angioplasty 03/17/2024   Non-ST elevation (NSTEMI) myocardial infarction (HCC) 03/16/2024   Hypervolemia 03/14/2024   Hypoxic respiratory failure (HCC) 03/14/2024   Left hip pain 01/30/2024   SVT (supraventricular tachycardia) 08/10/2023   Atrial fibrillation (HCC) 07/03/2023   Atrial fibrillation with RVR (HCC) 07/02/2023   Fall 06/15/2023   Non-compliance with renal dialysis 05/02/2023   Volume overload 04/29/2023   Renal osteodystrophy 11/25/2022   Unsteady gait 10/05/2022   Steal syndrome of dialysis vascular access 05/28/2022   Hydronephrosis, left 05/04/2022   Leukocytosis 05/04/2022   Diabetic retinopathy associated with diabetes mellitus due to underlying condition (HCC) 01/13/2022   ESRD on hemodialysis (HCC) 01/13/2022   Deafness in right ear 08/11/2021   History of colon polyps 10/15/2020   Type 2 diabetes mellitus, with long-term current use of insulin  (HCC) 09/09/2020   Cryptogenic stroke (HCC) 07/13/2020   History of loop recorder 07/13/2020   Lymphedema 06/08/2019   Anemia 04/17/2019   Swelling of both lower extremities 01/10/2019   Facial droop 04/09/2018   Daytime somnolence 03/30/2018   Carotid artery disease 02/03/2018  Intracranial vascular stenosis 09/12/2017   History of CVA (cerebrovascular accident) 11/10/2016   Benign localized hyperplasia of prostate with urinary obstruction 10/27/2016   History of nephrolithiasis 10/27/2016   TIA (transient ischemic attack) 10/20/2016   Near syncope 06/23/2016   Organic impotence 10/01/2015   Neuropathy 08/06/2015   Health care maintenance 08/06/2015   Hypertension 08/06/2015   Hypothyroidism 10/10/2013   Microalbuminuria 10/10/2013   Hyperlipidemia 10/10/2013   B12 deficiency 10/10/2013    Environmental allergies 07/04/2013    ONSET DATE: 3 years ago  REFERRING DIAG: unsteady gait  THERAPY DIAG:  Muscle weakness (generalized)  Unsteadiness on feet  Other abnormalities of gait and mobility  Difficulty in walking, not elsewhere classified  Rationale for Evaluation and Treatment: Rehabilitation  SUBJECTIVE:                                                                                                                                                                                             SUBJECTIVE STATEMENT:   Patient reports last time he had dialysis last Thursday. Reports he felt his balance started feeling really off yesterday, felt like he could barely walk. Is still feeling off today.   Pt accompanied by: self  PERTINENT HISTORY: Patient returning to PT s/p hospitalization for pneumonia, , SVT, ESRD on hypodialysis. PMH DM, HTN, Anemia, Bells palsy, Hypothyroidism, Kidney stones, stroke and pseudotumour cerebri. Macular degeneration with impaired visual acuity   PAIN:  Are you having pain? Some L foot numbness/neuropathy  PRECAUTIONS: Fall  RED FLAGS: None   WEIGHT BEARING RESTRICTIONS: No  FALLS: Has patient fallen in last 6 months? No  LIVING ENVIRONMENT: Lives with: lives alone Lives in: House/apartment Stairs: yes stairs in house Has following equipment at home: Single point cane  PLOF: Independent with basic ADLs  PATIENT GOALS: balance, strength  OBJECTIVE:  Note: Objective measures were completed at Evaluation unless otherwise noted.  DIAGNOSTIC FINDINGS: IMPRESSION: Interval resolution of previously noted trace left parafalcine subdural hematoma. No acute intracranial process.  COGNITION: Overall cognitive status: Within functional limits for tasks assessed   SENSATION: WFL  COORDINATION: Heel slide: WFL RLE; slight decrease with LLE   MUSCLE TONE: WFL  POSTURE: rounded shoulders, forward head, and posterior pelvic  tilt  LOWER EXTREMITY MMT:    MMT Right Eval Left Eval  Hip flexion 4 4  Hip extension    Hip abduction 3 3  Hip adduction 3+ 3+  Hip internal rotation    Hip external rotation    Knee flexion 4- 4-  Knee extension 4 4  Ankle dorsiflexion 3 2+  Ankle plantarflexion 3+ 3  Ankle  inversion    Ankle eversion    (Blank rows = not tested)  BED MOBILITY:  Not tested  TRANSFERS: Sit to stand: CGA  Assistive device utilized: None     Stand to sit: CGA  Assistive device utilized: None     Chair to chair: CGA  Assistive device utilized: None       RAMP:  Not tested  CURB:  Findings: needs UE support to step up  STAIRS: heavy BUE support step over step negotiation.  GAIT: Findings: Gait Characteristics: decreased stride length, Left foot flat, wide BOS, and poor foot clearance- Left, Distance walked: 60 ft, Assistive device utilized:None, Level of assistance: CGA, and Comments: increased L ankle rolling due to not wearing AFO  FUNCTIONAL TESTS:  5 times sit to stand: 30.53 with 4 posterior LOB 6 minute walk test: perform next session  Berg Balance Scale: 26  PATIENT SURVEYS:  LEFS give next session                                                                                                                               TREATMENT DATE: 09/22/24 Vitals/ Self Care Seated: 185/76; second attempt: 189/66; standing 175/62; standing 3 minutes 176/78 -patient's PCP notified, awaiting reply back for information, PCP called back and recommended ER.  -education on need for ER due to history of stroke, significant changes to balance      NMR: To facilitate reeducation of movement, balance, posture, coordination, and/or proprioception/kinesthetic sense. Standing with CGA next to support surface:  Airex pad: static stand 30 seconds x 2 trials, noticeable trembling of ankles/LE's with fatigue and challenge to maintain stability Airex pad: horizontal head turns 30 seconds  scanning room 10x ; cueing for arc of motion  Airex pad: vertical head turns 30 seconds, cueing for arc of motion, noticeable sway with upward gaze increasing demand on ankle righting reaction musculature Airex pad: one foot on 6 step one foot on airex pad, hold position for 30 seconds, switch legs, 2x each LE; -LLE back is significantly harder  *SIGNIFICANT CHANGES TO BALANCE NOTED  PATIENT EDUCATION: Education details: goals, POC, HEP Person educated: Patient Education method: Explanation, Demonstration, Tactile cues, Verbal cues, and Handouts Education comprehension: verbalized understanding, returned demonstration, verbal cues required, tactile cues required, and needs further education  HOME EXERCISE PROGRAM: Access Code: [email or web address] URL: [email or web address] Date: 05/26/2024 Prepared by: Sidra Simpers  Exercises - Leg Extension  - 1 x daily - 7 x weekly - 2 sets - 10 reps - 5 hold - Seated March  - 1 x daily - 7 x weekly - 2 sets - 10 reps - 5 hold - Seated Heel Raise  - 1 x daily - 7 x weekly - 2 sets - 10 reps - 5 hold - Sit to Stand  - 1 x daily - 7 x weekly - 3 sets - 10 reps - Mini Squat with Counter Support  - 1 x daily -  7 x weekly - 3 sets - 10 reps - Standing 4-Way Leg Reach with Counter Support  - 1 x daily - 7 x weekly - 3 sets - 10 reps  GOALS: Goals reviewed with patient? Yes  SHORT TERM GOALS: Target date: 01/26/2024  Patient will be independent in home exercise program to improve strength/mobility for better functional independence with ADLs.  Baseline: 12/29: HEP compliant Goal status: MET  LONG TERM GOALS: Target date 11/15/2024   Patient (< 36 years old) will complete five times sit to stand test in < 10 seconds indicating an increased LE strength and improved balance.  Baseline: 30.53 with 4 posterior LOB 03/29/24: 22.61 with 3 posterior LOB 06/21/24: 21.7 with 2 posterior LOB 12/29: 20.12 seconds with 1 posterior LOB  Goal status:  PROGRESSING  2.   Patient will increase Berg Balance score by >45/56 to demonstrate decreased fall risk during functional activities.  Baseline: 5/5: 26  03/29/24: 32 12/29: 45 Goal status: Partially Met   3.    Patient will increase six minute walk test distance to >1200 for progression to age norm community ambulator and improve gait ability  Baseline: perform next session 5/12: 710 ft  03/29/24: 770' 06/18/24: 792' 12/29: 620 ft with stop at 5 minutes due to two consecutive LOB's  Goal status: PROGRESSING  4.  Patient will score > 60/80 on LEFS for improved functional mobility and quality of life.  Baseline: perform next session 5/12: 43 03/29/24: 39/80 06/21/24: 38/80 12/29: 35/80 Goal status: Ongoing    ASSESSMENT:  CLINICAL IMPRESSION:   Patient's vitals are elevated, physician notified and called during end of treatment session. Recommended patient go to ER due to symptoms.  Patient notified, not agreeable, physician talked to patient via phone during session to attempt to convince patient to go to ER. Patient eventually agreed and PT took patient to ER and signed patient in at front desk. Gave list of vitals to staff at front desk. Patient to see cardiologist on the 4th.   Pt will continue to benefit from skilled therapy to address remaining deficits in order to improve overall QoL and return to PLOF.         OBJECTIVE IMPAIRMENTS: Abnormal gait, cardiopulmonary status limiting activity, decreased activity tolerance, decreased balance, decreased coordination, decreased endurance, decreased knowledge of use of DME, decreased mobility, difficulty walking, decreased ROM, decreased strength, dizziness, impaired perceived functional ability, impaired flexibility, impaired vision/preception, improper body mechanics, postural dysfunction, and obesity.   ACTIVITY LIMITATIONS: carrying, lifting, bending, sitting, standing, squatting, stairs, transfers, bed mobility, toileting, dressing,  reach over head, hygiene/grooming, locomotion level, and caring for others  PARTICIPATION LIMITATIONS: meal prep, cleaning, laundry, driving, shopping, community activity, occupation, and yard work  PERSONAL FACTORS: Age, Behavior pattern, Fitness, Past/current experiences, Time since onset of injury/illness/exacerbation, Transportation, and 3+ comorbidities: DM, HTN, Anemia, Bells palsy, Hypothyroidism, Kidney stones, stroke and pseudotumour cerebri. Macular degeneration with impaired visual acuity  are also affecting patient's functional outcome.   REHAB POTENTIAL: Good  CLINICAL DECISION MAKING: Evolving/moderate complexity  EVALUATION COMPLEXITY: Moderate  PLAN:  PT FREQUENCY: 2x/week  PT DURATION: 12 weeks  PLANNED INTERVENTIONS: 97164- PT Re-evaluation, 97750- Physical Performance Testing, 97110-Therapeutic exercises, 97530- Therapeutic activity, [id number]- Neuromuscular re-education, 97535- Self Care, [id number]- Manual therapy, [id number]- Gait training, [id number]- Orthotic Initial, [id number]- Orthotic/Prosthetic subsequent, [phone]- Canalith repositioning, [id number]- Splinting, H9716- Electrical stimulation (unattended), [id number]- Electrical stimulation (manual), [id number]- Vasopneumatic device, [id number]- Ultrasound, [id number]- Traction (mechanical), [id number]- Ionotophoresis 4mg /ml Dexamethasone , Patient/Family education, Balance training, Stair training, Taping,  Dry Needling, Joint mobilization, Spinal mobilization, Scar mobilization, Compression bandaging, Vestibular training, Visual/preceptual remediation/compensation, Cognitive remediation, DME instructions, Cryotherapy, Moist heat, and Biofeedback  PLAN FOR NEXT SESSION:  Blaze Pods, Korebalance balance, strength, L ankle strength, stairs, LE endurance   Ares Tegtmeyer  Leopoldo KLEIN ,DPT Physical Therapist- [place]  [facility]  09/22/24, 12:46 PM   "

## 2024-09-22 NOTE — ED Notes (Signed)
 Pt states that he has dialysis at 630 tomorrow morning and he would like to go home, states that he doesn't want to wait for dialysis here.  Informed pt that his K was elevated and we were concerned that he could go into V tach and die, pt states that people have told him this, but he wants to go home, md notified, md at bedside talking with pt.  MD explained risk and benefit of leaving .

## 2024-09-22 NOTE — ED Triage Notes (Signed)
 Pt to ED from PT for HTN, took meds this am. States also having trouble with balance that has been chronic d/t neuropathy but worsening over past few days. Denies dizziness. Denies chest pain.

## 2024-09-27 ENCOUNTER — Ambulatory Visit: Admitting: Podiatry

## 2024-09-27 ENCOUNTER — Ambulatory Visit

## 2024-09-29 ENCOUNTER — Ambulatory Visit

## 2024-10-04 ENCOUNTER — Ambulatory Visit

## 2024-10-06 ENCOUNTER — Ambulatory Visit: Admitting: Podiatry

## 2024-10-06 ENCOUNTER — Ambulatory Visit

## 2024-10-11 ENCOUNTER — Ambulatory Visit

## 2024-10-13 ENCOUNTER — Ambulatory Visit

## 2024-10-18 ENCOUNTER — Ambulatory Visit

## 2024-10-20 ENCOUNTER — Ambulatory Visit

## 2024-10-25 ENCOUNTER — Ambulatory Visit

## 2024-10-27 ENCOUNTER — Ambulatory Visit

## 2024-11-01 ENCOUNTER — Ambulatory Visit

## 2024-11-03 ENCOUNTER — Ambulatory Visit

## 2024-11-08 ENCOUNTER — Ambulatory Visit

## 2024-11-10 ENCOUNTER — Ambulatory Visit

## 2024-11-15 ENCOUNTER — Ambulatory Visit

## 2024-11-17 ENCOUNTER — Ambulatory Visit

## 2024-11-22 ENCOUNTER — Ambulatory Visit

## 2024-11-24 ENCOUNTER — Ambulatory Visit

## 2024-11-29 ENCOUNTER — Ambulatory Visit

## 2024-12-01 ENCOUNTER — Ambulatory Visit

## 2024-12-06 ENCOUNTER — Ambulatory Visit

## 2024-12-08 ENCOUNTER — Ambulatory Visit

## 2024-12-13 ENCOUNTER — Ambulatory Visit

## 2024-12-15 ENCOUNTER — Ambulatory Visit

## 2024-12-17 ENCOUNTER — Ambulatory Visit: Admitting: Internal Medicine

## 2024-12-20 ENCOUNTER — Ambulatory Visit

## 2024-12-22 ENCOUNTER — Ambulatory Visit

## 2024-12-27 ENCOUNTER — Ambulatory Visit

## 2024-12-29 ENCOUNTER — Ambulatory Visit

## 2025-01-03 ENCOUNTER — Ambulatory Visit

## 2025-01-05 ENCOUNTER — Ambulatory Visit
